# Patient Record
Sex: Male | Born: 1955
Health system: Southern US, Community
[De-identification: ages and names within clinical notes are randomized; demographics above are authoritative.]

## PROBLEM LIST (undated history)

## (undated) ENCOUNTER — Emergency Department (HOSPITAL_COMMUNITY): Admission: EM | Payer: Medicare Other

## (undated) DIAGNOSIS — Z9119 Patient's noncompliance with other medical treatment and regimen: Secondary | ICD-10-CM

## (undated) DIAGNOSIS — Z8673 Personal history of transient ischemic attack (TIA), and cerebral infarction without residual deficits: Secondary | ICD-10-CM

## (undated) DIAGNOSIS — E785 Hyperlipidemia, unspecified: Secondary | ICD-10-CM

## (undated) DIAGNOSIS — G4730 Sleep apnea, unspecified: Secondary | ICD-10-CM

## (undated) DIAGNOSIS — J811 Chronic pulmonary edema: Secondary | ICD-10-CM

## (undated) DIAGNOSIS — F329 Major depressive disorder, single episode, unspecified: Secondary | ICD-10-CM

## (undated) DIAGNOSIS — K219 Gastro-esophageal reflux disease without esophagitis: Secondary | ICD-10-CM

## (undated) DIAGNOSIS — I2510 Atherosclerotic heart disease of native coronary artery without angina pectoris: Secondary | ICD-10-CM

## (undated) DIAGNOSIS — J449 Chronic obstructive pulmonary disease, unspecified: Secondary | ICD-10-CM

## (undated) DIAGNOSIS — J9691 Respiratory failure, unspecified with hypoxia: Secondary | ICD-10-CM

## (undated) DIAGNOSIS — F419 Anxiety disorder, unspecified: Secondary | ICD-10-CM

## (undated) DIAGNOSIS — Z8701 Personal history of pneumonia (recurrent): Secondary | ICD-10-CM

## (undated) DIAGNOSIS — F1410 Cocaine abuse, uncomplicated: Secondary | ICD-10-CM

## (undated) DIAGNOSIS — M1990 Unspecified osteoarthritis, unspecified site: Secondary | ICD-10-CM

## (undated) DIAGNOSIS — R51 Headache: Secondary | ICD-10-CM

## (undated) DIAGNOSIS — J9600 Acute respiratory failure, unspecified whether with hypoxia or hypercapnia: Secondary | ICD-10-CM

## (undated) DIAGNOSIS — I10 Essential (primary) hypertension: Secondary | ICD-10-CM

## (undated) DIAGNOSIS — R519 Headache, unspecified: Secondary | ICD-10-CM

## (undated) DIAGNOSIS — K759 Inflammatory liver disease, unspecified: Secondary | ICD-10-CM

## (undated) DIAGNOSIS — G9341 Metabolic encephalopathy: Secondary | ICD-10-CM

## (undated) DIAGNOSIS — F32A Depression, unspecified: Secondary | ICD-10-CM

## (undated) DIAGNOSIS — E119 Type 2 diabetes mellitus without complications: Secondary | ICD-10-CM

## (undated) DIAGNOSIS — K810 Acute cholecystitis: Secondary | ICD-10-CM

## (undated) DIAGNOSIS — Z91199 Patient's noncompliance with other medical treatment and regimen due to unspecified reason: Secondary | ICD-10-CM

## (undated) DIAGNOSIS — C649 Malignant neoplasm of unspecified kidney, except renal pelvis: Secondary | ICD-10-CM

## (undated) DIAGNOSIS — Z87442 Personal history of urinary calculi: Secondary | ICD-10-CM

## (undated) HISTORY — DX: Malignant neoplasm of unspecified kidney, except renal pelvis: C64.9

## (undated) HISTORY — DX: Metabolic encephalopathy: G93.41

## (undated) HISTORY — DX: Essential (primary) hypertension: I10

## (undated) HISTORY — DX: Personal history of transient ischemic attack (TIA), and cerebral infarction without residual deficits: Z86.73

## (undated) HISTORY — DX: Atherosclerotic heart disease of native coronary artery without angina pectoris: I25.10

## (undated) HISTORY — DX: Patient's noncompliance with other medical treatment and regimen: Z91.19

## (undated) HISTORY — DX: Respiratory failure, unspecified with hypoxia: J96.91

## (undated) HISTORY — DX: Type 2 diabetes mellitus without complications: E11.9

## (undated) HISTORY — DX: Patient's noncompliance with other medical treatment and regimen due to unspecified reason: Z91.199

## (undated) HISTORY — DX: Chronic pulmonary edema: J81.1

## (undated) HISTORY — DX: Personal history of urinary calculi: Z87.442

## (undated) HISTORY — DX: Acute respiratory failure, unspecified whether with hypoxia or hypercapnia: J96.00

---
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
## 1998-01-29 ENCOUNTER — Emergency Department (HOSPITAL_COMMUNITY): Admission: EM | Admit: 1998-01-29 | Discharge: 1998-01-29 | Payer: Self-pay

## 1998-02-02 ENCOUNTER — Emergency Department (HOSPITAL_COMMUNITY): Admission: EM | Admit: 1998-02-02 | Discharge: 1998-02-02 | Payer: Self-pay | Admitting: Emergency Medicine

## 1998-08-05 ENCOUNTER — Inpatient Hospital Stay (HOSPITAL_COMMUNITY): Admission: EM | Admit: 1998-08-05 | Discharge: 1998-08-07 | Payer: Self-pay | Admitting: Emergency Medicine

## 1998-08-05 ENCOUNTER — Encounter: Payer: Self-pay | Admitting: Emergency Medicine

## 1998-08-07 ENCOUNTER — Encounter: Payer: Self-pay | Admitting: *Deleted

## 1998-08-12 ENCOUNTER — Encounter: Admission: RE | Admit: 1998-08-12 | Discharge: 1998-08-12 | Payer: Self-pay | Admitting: Internal Medicine

## 1998-08-15 ENCOUNTER — Encounter: Admission: RE | Admit: 1998-08-15 | Discharge: 1998-08-15 | Payer: Self-pay | Admitting: Internal Medicine

## 1998-08-15 ENCOUNTER — Ambulatory Visit (HOSPITAL_COMMUNITY): Admission: RE | Admit: 1998-08-15 | Discharge: 1998-08-15 | Payer: Self-pay | Admitting: Internal Medicine

## 2002-11-11 ENCOUNTER — Emergency Department (HOSPITAL_COMMUNITY): Admission: EM | Admit: 2002-11-11 | Discharge: 2002-11-11 | Payer: Self-pay | Admitting: Emergency Medicine

## 2004-11-09 ENCOUNTER — Emergency Department (HOSPITAL_COMMUNITY): Admission: EM | Admit: 2004-11-09 | Discharge: 2004-11-09 | Payer: Self-pay | Admitting: Emergency Medicine

## 2005-03-11 ENCOUNTER — Emergency Department (HOSPITAL_COMMUNITY): Admission: EM | Admit: 2005-03-11 | Discharge: 2005-03-11 | Payer: Self-pay | Admitting: Emergency Medicine

## 2005-05-25 ENCOUNTER — Emergency Department (HOSPITAL_COMMUNITY): Admission: EM | Admit: 2005-05-25 | Discharge: 2005-05-25 | Payer: Self-pay | Admitting: Emergency Medicine

## 2008-12-14 ENCOUNTER — Emergency Department (HOSPITAL_COMMUNITY): Admission: EM | Admit: 2008-12-14 | Discharge: 2008-12-14 | Payer: Self-pay | Admitting: Emergency Medicine

## 2008-12-25 ENCOUNTER — Emergency Department (HOSPITAL_COMMUNITY): Admission: EM | Admit: 2008-12-25 | Discharge: 2008-12-25 | Payer: Self-pay | Admitting: Emergency Medicine

## 2009-07-20 DIAGNOSIS — E119 Type 2 diabetes mellitus without complications: Secondary | ICD-10-CM

## 2009-07-20 HISTORY — DX: Type 2 diabetes mellitus without complications: E11.9

## 2010-10-27 LAB — BASIC METABOLIC PANEL
BUN: 8 mg/dL (ref 6–23)
CO2: 23 mEq/L (ref 19–32)
Calcium: 9.4 mg/dL (ref 8.4–10.5)
Chloride: 106 mEq/L (ref 96–112)

## 2012-05-31 ENCOUNTER — Observation Stay (HOSPITAL_BASED_OUTPATIENT_CLINIC_OR_DEPARTMENT_OTHER)
Admission: EM | Admit: 2012-05-31 | Discharge: 2012-05-31 | Discharge status: Against Medical Advice | Payer: Self-pay | Attending: Emergency Medicine | Admitting: Emergency Medicine

## 2012-05-31 ENCOUNTER — Emergency Department (HOSPITAL_BASED_OUTPATIENT_CLINIC_OR_DEPARTMENT_OTHER): Payer: Self-pay

## 2012-05-31 ENCOUNTER — Encounter (HOSPITAL_BASED_OUTPATIENT_CLINIC_OR_DEPARTMENT_OTHER): Payer: Self-pay | Admitting: Family Medicine

## 2012-05-31 DIAGNOSIS — R0602 Shortness of breath: Secondary | ICD-10-CM | POA: Insufficient documentation

## 2012-05-31 DIAGNOSIS — F17200 Nicotine dependence, unspecified, uncomplicated: Secondary | ICD-10-CM | POA: Insufficient documentation

## 2012-05-31 DIAGNOSIS — M7989 Other specified soft tissue disorders: Secondary | ICD-10-CM | POA: Insufficient documentation

## 2012-05-31 DIAGNOSIS — R079 Chest pain, unspecified: Secondary | ICD-10-CM

## 2012-05-31 DIAGNOSIS — Z8701 Personal history of pneumonia (recurrent): Secondary | ICD-10-CM | POA: Insufficient documentation

## 2012-05-31 DIAGNOSIS — I10 Essential (primary) hypertension: Secondary | ICD-10-CM | POA: Insufficient documentation

## 2012-05-31 DIAGNOSIS — Z8673 Personal history of transient ischemic attack (TIA), and cerebral infarction without residual deficits: Secondary | ICD-10-CM | POA: Insufficient documentation

## 2012-05-31 DIAGNOSIS — R072 Precordial pain: Principal | ICD-10-CM | POA: Insufficient documentation

## 2012-05-31 LAB — COMPREHENSIVE METABOLIC PANEL
ALT: 15 U/L (ref 0–53)
CO2: 22 mEq/L (ref 19–32)
Chloride: 103 mEq/L (ref 96–112)
Creatinine, Ser: 1 mg/dL (ref 0.50–1.35)
GFR calc Af Amer: >90 mL/min (ref >90)
GFR calc non Af Amer: 82 mL/min — ABNORMAL LOW (ref >90)
Potassium: 3.5 mEq/L (ref 3.5–5.1)
Sodium: 137 mEq/L (ref 135–145)

## 2012-05-31 LAB — CBC
MCV: 83.1 fL (ref 78.0–100.0)
RDW: 13.8 % (ref 11.5–15.5)
WBC: 10.5 10*3/uL (ref 4.0–10.5)

## 2012-05-31 LAB — CK TOTAL AND CKMB (NOT AT ARMC)
CK, MB: 4.1 ng/mL — ABNORMAL HIGH (ref 0.3–4.0)
Relative Index: 3.7 — ABNORMAL HIGH (ref 0.0–2.5)
Total CK: 112 U/L (ref 7–232)

## 2012-05-31 LAB — TROPONIN I: Troponin I: <0.3 ng/mL (ref <0.30)

## 2012-05-31 LAB — PRO B NATRIURETIC PEPTIDE: Pro B Natriuretic peptide (BNP): 128.8 pg/mL — ABNORMAL HIGH (ref 0–125)

## 2012-05-31 MED ORDER — ONDANSETRON HCL 4 MG/2ML IJ SOLN
4.0000 mg | Freq: Three times a day (TID) | INTRAMUSCULAR | Status: DC | PRN
Start: 2012-05-31 — End: 2012-05-31

## 2012-05-31 MED ORDER — NITROGLYCERIN 0.4 MG SL SUBL
0.4000 mg | SUBLINGUAL_TABLET | SUBLINGUAL | Status: DC | PRN
  Administered 2012-05-31 (×2): 0.4 mg via SUBLINGUAL
  Filled 2012-05-31: qty 25

## 2012-05-31 MED ORDER — ASPIRIN EC 325 MG PO TBEC
325.0000 mg | DELAYED_RELEASE_TABLET | Freq: Once | ORAL | Status: AC
  Administered 2012-05-31: 325 mg via ORAL
  Filled 2012-05-31: qty 1

## 2012-05-31 MED ORDER — NITROGLYCERIN 0.4 MG SL SUBL: 0.4000 mg | SUBLINGUAL_TABLET | SUBLINGUAL | Status: DC | PRN

## 2012-05-31 NOTE — ED Notes (Signed)
Pt c/o left sided chest pain radiating down left arm x 2 wks but worse since last night. Pt reports similar pain in past and sts he had pneumonia at that time. Pt also c/o dyspnea with exertion. Pt is drug user and sts last use last week.

## 2012-05-31 NOTE — ED Notes (Signed)
Pt sts he cannot provide urine specimen.

## 2012-05-31 NOTE — ED Provider Notes (Signed)
I saw and evaluated the patient, reviewed the resident's note and I agree with the findings and plan.   .Face to face Exam:  General:  Awake HEENT:  Atraumatic Resp:  Normal effort Abd:  Nondistended Neuro:No focal weakness Lymph: No adenopathy  Patient refuses to be admitted to the hospital.  Patient signed out AGAINST MEDICAL ADVICE.  Patient was instructed of the risks for signing out AGAINST MEDICAL ADVICE which included sudden death or heart attack.  Patient was given prescription for nitroglycerin and told to start daily aspirin.  If he changes his mind or has worsening condition he can return here or to the nearest hospital.  Nelia Shi, MD 05/31/12 1147

## 2012-05-31 NOTE — Plan of Care (Signed)
Called by [facility]/ Dr Radford Pax for [name] at [facility] ED. briefly 56 year old male with history of hypertension, history of prior CVA, polysubstance abuse (cocaine) presented with chest pain, radiating to the left arm, pressure-like improved with nitroglycerin s/l, troponin negative however CK-MB and index elevated. EKG normal with no acute ST-T wave changes. Patient accepted to telemetry further workup, chest pain r/o.   RAI,RIPUDEEP M.D. [facility] 05/31/2012, 11:35 AM  Pager: [phone]

## 2012-05-31 NOTE — ED Provider Notes (Signed)
History     CSN: [id number]  Arrival date & time 05/31/12  0915   First MD Initiated Contact with Patient 05/31/12 [phone]      Chief Complaint  Patient presents with  . Chest Pain    (Consider location/radiation/quality/duration/timing/severity/associated sxs/prior treatment) HPI Pt complains of chest pain and SOB which has been occurring intermittently at rest for 2 weeks. He states the most recent episode occurred while en route to ED for evaluation, and has since resolved. Pt states CP/SOB occur as frequently as every hour. He states the pain is substernal with radiation to the left-chest and sometimes the left arm. He describes the pain as pressure-like. He states that with these episodes he also has feelings of warmth and diarphoresis. He states he took 3 of his mother's NTG yesterday evening which resolved an episode of pain within 5 minutes. He states that since the summer he had been having CP/SOB on exertion relieved with rest, but that over the last 2 weeks these episodes have also occurred at rest, as previously described. He denies any other alleviating or exacerbating factors. Pt states he was incarcerated from 2007-2010 and after paroled he has not taken any of his previous anti-hypertensives. He states that he smokes 1ppd and uses cocaine frequently, with previous use as recent as 1 week prior. He denies any temporal relationship between cocaine use and CP onset. Pt states his brother had an MI and subsequently had stenting at age 64.  Past Medical History  Diagnosis Date  . Hypertension   . Stroke   . Pneumonia     Past Surgical History  Procedure Date  . Appendectomy     No family history on file.  History  Substance Use Topics  . Smoking status: Current Every Day Smoker    Types: Cigarettes  . Smokeless tobacco: Not on file  . Alcohol Use: No      Review of Systems  Constitutional: Negative for fever and chills.  HENT: Negative for congestion.     Respiratory: Positive for shortness of breath. Negative for cough and wheezing.   Cardiovascular: Positive for chest pain and leg swelling. Negative for palpitations.  Gastrointestinal: Negative for nausea, vomiting and diarrhea.  Genitourinary: Negative.   Musculoskeletal: Negative.   Skin: Negative.   Neurological: Negative for dizziness, syncope and light-headedness.  Hematological: Negative.   Psychiatric/Behavioral: Negative.     Allergies  Review of patient's allergies indicates no known allergies.  Home Medications  No current outpatient prescriptions on file.  BP 120/65  Pulse 80  Temp 97.5 F (36.4 C) (Oral)  Resp 23  SpO2 92%  Physical Exam  Constitutional: He is oriented to person, place, and time. He appears well-developed and well-nourished. No distress.  HENT:  Head: Normocephalic and atraumatic.  Eyes: Pupils are equal, round, and reactive to light. No scleral icterus.  Neck: Normal range of motion. No tracheal deviation present.  Cardiovascular: Normal rate and regular rhythm.   No murmur heard. Pulmonary/Chest: Effort normal. He has no wheezes. He has no rales.  Abdominal: Soft. Bowel sounds are normal. He exhibits no distension. There is no tenderness.  Musculoskeletal: Normal range of motion. He exhibits no edema.  Neurological: He is alert and oriented to person, place, and time. No cranial nerve deficit.  Skin: Skin is warm and dry. No rash noted.  Psychiatric: He has a normal mood and affect. His behavior is normal.    ED Course  Procedures (including critical care time)   Date:  05/31/2012  Rate: 74  Rhythm: normal sinus rhythm  QRS Axis: normal  Intervals: normal  ST/T Wave abnormalities: nonspecific ST changes  Conduction Disutrbances:none  Narrative Interpretation:   Old EKG Reviewed: none available   Labs Reviewed  COMPREHENSIVE METABOLIC PANEL - Abnormal; Notable for the following:    Glucose, Bld 187 (*)     Albumin 3.4 (*)      GFR calc non Af Amer 82 (*)     All other components within normal limits  CK TOTAL AND CKMB - Abnormal; Notable for the following:    CK, MB 4.1 (*)     Relative Index 3.7 (*)     All other components within normal limits  PRO B NATRIURETIC PEPTIDE - Abnormal; Notable for the following:    Pro B Natriuretic peptide (BNP) 128.8 (*)     All other components within normal limits  CBC  TROPONIN I  URINALYSIS, ROUTINE W REFLEX MICROSCOPIC  URINE RAPID DRUG SCREEN (HOSP PERFORMED)   Dg Chest 2 View  05/31/2012  *RADIOLOGY REPORT*  Clinical Data: Left sided chest pain and shortness of breath for 2 weeks  CHEST - 2 VIEW  Comparison: 03/11/2005  Findings: Heart size and vascular pattern are normal.  Lungs are clear.  Osseous thorax is intact.  IMPRESSION:  Negative   Original Report Authenticated By: Esperanza Heir, M.D.      1. Chest pain       MDM  Patient stable. Troponin wnl. CKMB mildly elevated, with elevated RI. No CP at present. Pt to be transferred to telemetry bed at [facility] for further evaluation/management for suspected unstable angina. Dr. Isidoro Donning will be the accepting physician.         Elfredia Nevins, MD 05/31/12 1135

## 2012-06-01 ENCOUNTER — Inpatient Hospital Stay (HOSPITAL_COMMUNITY)
Admission: EM | Admit: 2012-06-01 | Discharge: 2012-06-03 | DRG: 287 | Discharge status: Against Medical Advice | Payer: MEDICAID | Attending: Internal Medicine | Admitting: Internal Medicine

## 2012-06-01 ENCOUNTER — Encounter (HOSPITAL_COMMUNITY): Payer: Self-pay | Admitting: *Deleted

## 2012-06-01 ENCOUNTER — Emergency Department (HOSPITAL_COMMUNITY): Payer: Self-pay

## 2012-06-01 DIAGNOSIS — Z91199 Patient's noncompliance with other medical treatment and regimen due to unspecified reason: Secondary | ICD-10-CM

## 2012-06-01 DIAGNOSIS — E119 Type 2 diabetes mellitus without complications: Secondary | ICD-10-CM | POA: Diagnosis present

## 2012-06-01 DIAGNOSIS — E785 Hyperlipidemia, unspecified: Secondary | ICD-10-CM | POA: Diagnosis present

## 2012-06-01 DIAGNOSIS — F1410 Cocaine abuse, uncomplicated: Secondary | ICD-10-CM

## 2012-06-01 DIAGNOSIS — Z7982 Long term (current) use of aspirin: Secondary | ICD-10-CM

## 2012-06-01 DIAGNOSIS — Z79899 Other long term (current) drug therapy: Secondary | ICD-10-CM

## 2012-06-01 DIAGNOSIS — Z8673 Personal history of transient ischemic attack (TIA), and cerebral infarction without residual deficits: Secondary | ICD-10-CM

## 2012-06-01 DIAGNOSIS — I200 Unstable angina: Secondary | ICD-10-CM

## 2012-06-01 DIAGNOSIS — Z9119 Patient's noncompliance with other medical treatment and regimen: Secondary | ICD-10-CM

## 2012-06-01 DIAGNOSIS — Z9114 Patient's other noncompliance with medication regimen: Secondary | ICD-10-CM

## 2012-06-01 DIAGNOSIS — Z91148 Patient's other noncompliance with medication regimen for other reason: Secondary | ICD-10-CM

## 2012-06-01 DIAGNOSIS — Z23 Encounter for immunization: Secondary | ICD-10-CM

## 2012-06-01 DIAGNOSIS — I10 Essential (primary) hypertension: Secondary | ICD-10-CM | POA: Diagnosis present

## 2012-06-01 DIAGNOSIS — F17200 Nicotine dependence, unspecified, uncomplicated: Secondary | ICD-10-CM | POA: Diagnosis present

## 2012-06-01 DIAGNOSIS — I2510 Atherosclerotic heart disease of native coronary artery without angina pectoris: Principal | ICD-10-CM | POA: Diagnosis present

## 2012-06-01 DIAGNOSIS — IMO0002 Reserved for concepts with insufficient information to code with codable children: Secondary | ICD-10-CM

## 2012-06-01 DIAGNOSIS — Z720 Tobacco use: Secondary | ICD-10-CM

## 2012-06-01 DIAGNOSIS — E1165 Type 2 diabetes mellitus with hyperglycemia: Secondary | ICD-10-CM

## 2012-06-01 DIAGNOSIS — Z8249 Family history of ischemic heart disease and other diseases of the circulatory system: Secondary | ICD-10-CM

## 2012-06-01 DIAGNOSIS — Z9089 Acquired absence of other organs: Secondary | ICD-10-CM

## 2012-06-01 DIAGNOSIS — R079 Chest pain, unspecified: Secondary | ICD-10-CM

## 2012-06-01 HISTORY — DX: Inflammatory liver disease, unspecified: K75.9

## 2012-06-01 LAB — CBC WITH DIFFERENTIAL/PLATELET
Basophils Absolute: 0 10*3/uL (ref 0.0–0.1)
Basophils Relative: 0 % (ref 0–1)
Eosinophils Absolute: 0.2 10*3/uL (ref 0.0–0.7)
Eosinophils Relative: 2 % (ref 0–5)
MCH: 28.4 pg (ref 26.0–34.0)
MCV: 84.8 fL (ref 78.0–100.0)
Neutrophils Relative %: 74 % (ref 43–77)
Platelets: 202 10*3/uL (ref 150–400)
RBC: 5.45 MIL/uL (ref 4.22–5.81)
RDW: 13.6 % (ref 11.5–15.5)

## 2012-06-01 LAB — RAPID URINE DRUG SCREEN, HOSP PERFORMED
Amphetamines: NOT DETECTED
Barbiturates: NOT DETECTED
Benzodiazepines: NOT DETECTED
Cocaine: POSITIVE — AB
Tetrahydrocannabinol: NOT DETECTED

## 2012-06-01 LAB — LIPID PANEL
HDL: 24 mg/dL — ABNORMAL LOW (ref >39)
LDL Cholesterol: 99 mg/dL (ref 0–99)
Triglycerides: 307 mg/dL — ABNORMAL HIGH (ref <150)
VLDL: 61 mg/dL — ABNORMAL HIGH (ref 0–40)

## 2012-06-01 LAB — HEPARIN LEVEL (UNFRACTIONATED): Heparin Unfractionated: <0.1 IU/mL — ABNORMAL LOW (ref 0.30–0.70)

## 2012-06-01 LAB — POCT I-STAT TROPONIN I: Troponin i, poc: 0 ng/mL (ref 0.00–0.08)

## 2012-06-01 LAB — BASIC METABOLIC PANEL
Calcium: 9 mg/dL (ref 8.4–10.5)
GFR calc Af Amer: >90 mL/min (ref >90)
GFR calc non Af Amer: 80 mL/min — ABNORMAL LOW (ref >90)
Glucose, Bld: 206 mg/dL — ABNORMAL HIGH (ref 70–99)
Potassium: 3.3 mEq/L — ABNORMAL LOW (ref 3.5–5.1)
Sodium: 136 mEq/L (ref 135–145)

## 2012-06-01 LAB — TROPONIN I
Troponin I: <0.3 ng/mL (ref <0.30)
Troponin I: <0.3 ng/mL (ref <0.30)
Troponin I: <0.3 ng/mL (ref <0.30)

## 2012-06-01 LAB — PROTIME-INR: Prothrombin Time: 13.9 seconds (ref 11.6–15.2)

## 2012-06-01 MED ORDER — DIAZEPAM 5 MG PO TABS: 5.0000 mg | ORAL_TABLET | ORAL | Status: DC

## 2012-06-01 MED ORDER — SODIUM CHLORIDE 0.9 % IJ SOLN: 3.0000 mL | INTRAMUSCULAR | Status: DC | PRN

## 2012-06-01 MED ORDER — POTASSIUM CHLORIDE CRYS ER 20 MEQ PO TBCR
40.0000 meq | EXTENDED_RELEASE_TABLET | Freq: Once | ORAL | Status: AC
  Administered 2012-06-01: 40 meq via ORAL
  Filled 2012-06-01: qty 2

## 2012-06-01 MED ORDER — SODIUM CHLORIDE 0.9 % IV SOLN: 250.0000 mL | INTRAVENOUS | Status: DC | PRN

## 2012-06-01 MED ORDER — SODIUM CHLORIDE 0.9 % IV SOLN
INTRAVENOUS | Status: DC
  Administered 2012-06-01 – 2012-06-02 (×2): via INTRAVENOUS

## 2012-06-01 MED ORDER — HEPARIN (PORCINE) IN NACL 100-0.45 UNIT/ML-% IJ SOLN
1800.0000 [IU]/h | INTRAMUSCULAR | Status: DC
  Administered 2012-06-01: 1800 [IU]/h via INTRAVENOUS
  Administered 2012-06-01: 1400 [IU]/h via INTRAVENOUS
  Administered 2012-06-02: 1800 [IU]/h via INTRAVENOUS
  Filled 2012-06-01 (×4): qty 250

## 2012-06-01 MED ORDER — MORPHINE SULFATE 4 MG/ML IJ SOLN
4.0000 mg | Freq: Once | INTRAMUSCULAR | Status: AC
  Administered 2012-06-01: 4 mg via INTRAVENOUS
  Filled 2012-06-01: qty 1

## 2012-06-01 MED ORDER — HEPARIN SODIUM (PORCINE) 5000 UNIT/ML IJ SOLN
5000.0000 [IU] | Freq: Three times a day (TID) | INTRAMUSCULAR | Status: DC
  Filled 2012-06-01 (×3): qty 1

## 2012-06-01 MED ORDER — SODIUM CHLORIDE 0.9 % IJ SOLN
3.0000 mL | Freq: Two times a day (BID) | INTRAMUSCULAR | Status: DC
  Administered 2012-06-02: 3 mL via INTRAVENOUS

## 2012-06-01 MED ORDER — ACETAMINOPHEN 325 MG PO TABS: 650.0000 mg | ORAL_TABLET | Freq: Four times a day (QID) | ORAL | Status: DC | PRN

## 2012-06-01 MED ORDER — SODIUM CHLORIDE 0.9 % IJ SOLN: 3.0000 mL | Freq: Two times a day (BID) | INTRAMUSCULAR | Status: DC

## 2012-06-01 MED ORDER — ASPIRIN EC 81 MG PO TBEC
81.0000 mg | DELAYED_RELEASE_TABLET | Freq: Every day | ORAL | Status: DC
  Administered 2012-06-01: 81 mg via ORAL
  Filled 2012-06-01: qty 1

## 2012-06-01 MED ORDER — NITROGLYCERIN 0.4 MG SL SUBL: 0.4000 mg | SUBLINGUAL_TABLET | SUBLINGUAL | Status: DC | PRN

## 2012-06-01 MED ORDER — ACETAMINOPHEN 650 MG RE SUPP: 650.0000 mg | Freq: Four times a day (QID) | RECTAL | Status: DC | PRN

## 2012-06-01 MED ORDER — ASPIRIN EC 81 MG PO TBEC
81.0000 mg | DELAYED_RELEASE_TABLET | Freq: Every day | ORAL | Status: DC
  Filled 2012-06-01: qty 1

## 2012-06-01 MED ORDER — MORPHINE SULFATE 2 MG/ML IJ SOLN
1.0000 mg | INTRAMUSCULAR | Status: DC | PRN
  Administered 2012-06-02: 1 mg via INTRAVENOUS

## 2012-06-01 MED ORDER — INSULIN ASPART 100 UNIT/ML ~~LOC~~ SOLN: 0.0000 [IU] | SUBCUTANEOUS | Status: DC

## 2012-06-01 MED ORDER — HEPARIN BOLUS VIA INFUSION
4000.0000 [IU] | Freq: Once | INTRAVENOUS | Status: AC
  Administered 2012-06-01: 4000 [IU] via INTRAVENOUS

## 2012-06-01 MED ORDER — ASPIRIN 81 MG PO CHEW
324.0000 mg | CHEWABLE_TABLET | ORAL | Status: AC
  Administered 2012-06-02: 324 mg via ORAL
  Filled 2012-06-01: qty 4

## 2012-06-01 MED ORDER — NITROGLYCERIN IN D5W 200-5 MCG/ML-% IV SOLN
5.0000 ug/min | INTRAVENOUS | Status: DC
  Administered 2012-06-01: 5 ug/min via INTRAVENOUS
  Filled 2012-06-01: qty 250

## 2012-06-01 MED ORDER — ATORVASTATIN CALCIUM 20 MG PO TABS
20.0000 mg | ORAL_TABLET | Freq: Every day | ORAL | Status: DC
  Administered 2012-06-01 – 2012-06-02 (×2): 20 mg via ORAL
  Filled 2012-06-01 (×4): qty 1

## 2012-06-01 MED ORDER — METOPROLOL TARTRATE 12.5 MG HALF TABLET
12.5000 mg | ORAL_TABLET | Freq: Two times a day (BID) | ORAL | Status: DC
  Filled 2012-06-01 (×2): qty 1

## 2012-06-01 MED ORDER — HEPARIN BOLUS VIA INFUSION
2000.0000 [IU] | Freq: Once | INTRAVENOUS | Status: AC
  Administered 2012-06-01: 2000 [IU] via INTRAVENOUS
  Filled 2012-06-01: qty 2000

## 2012-06-01 NOTE — H&P (Signed)
[facility] Admission Note Date: 06/01/2012  Patient name: [name] Medical record number: [id number] Date of birth: [birth year] Age: 56 y.o. Gender: male PCP: No primary provider on file.  Medical Service: Internal medicine teaching service  Attending physician: Debe Coder, M.D.    First Contact: Dr. Garald Braver Pager: [phone] Second Contact: Dr. Clyde Lundborg Pager: [phone] After Hours (after 5PM)/ Weekend / Holidays: First Contact: Pager: [phone] Second Contact: Pager: [phone]  Chief Complaint: CP and SOB  History of Present Illness:   [name] is a 56 yo man with history of HTN, DM, and TIA presented to the ED with 3-week history of substernal intermittent chest pain.  He first noticed the pain during physical activities such as walking to his car or up and down the stairs about 3 weeks ago.  Each episode would only last for about 5 min and resolve on its own with rest.  He had on average 7~8 times daily.  During these CP episodes, the pain sometime radiates to his left chest, shoulder, and arm. He also becomes SOB, and dizzy.  Lately, his pain occurs even at rest, mostly pressure in nature; he sometimes wake up in the middle of night with this chest pressure.  Yesterday when the pain became worse, he went to the ED at [facility].  He was given nitroglycerin which was moderately helpful.  However, he left due to some "business" that he had to take care of before he could be admitted.  He was also started on ASA yesterday.  Since the pain recurred as before, he decided to come back to the hospital again today.  Denied vision changes, fever, weight loss, night sweat, recent sickness, HA, cough, or dysuria.  In ED, EKG showed new T-wave inversion in lead II and aVF.  Troponin has been negative x2.  Patient denied ever experienced chest pain prior to 3 weeks ago.  No history of CAD, echo, angiogram, or cardiac cath. He was incarcerated from 2007 to 2010 and he has not taken  his BP medication after parole.  He endorses mild swelling in bilateral hands and feet, worse later in the day. He was diagnosed with DM two years ago but stopped taking metformin after 4 months due to diarrhea.  He has not been check his blood sugar at home.  He smokes one pack of cigarettes daily and stated that a physician has hold him that he has hyperlipidemia.  Per ED note, patient uses cocaine frequently but denied its association with CP.  Patient has a brother who had an MI s/p stenting at age 63.            Meds: Current Outpatient Rx  Name  Route  Sig  Dispense  Refill  . ASPIRIN 325 MG PO TABS   Oral   Take 325 mg by mouth daily.         [name] NITROGLYCERIN 0.4 MG SL SUBL   Sublingual   Place 1 tablet (0.4 mg total) under the tongue every 5 (five) minutes as needed for chest pain.   30 tablet   0     Allergies: Allergies as of 06/01/2012  . (No Known Allergies)   Past Medical History  Diagnosis Date  . Hypertension     Was on metoprolol, patient decided to stop taking it about two years ago  . TIA (transient ischemic attack) 1997    No residual neurological deficits.  . Pneumonia   . DM (diabetes mellitus) 2011  Was on metformin, but patient stopped taking it after 4 mo due to diarrhea  . Hepatitis late 1970s    Treated in the hospital, finished treatment course. No recurrence  since   Past Surgical History  Procedure Date  . Appendectomy    Family History  Problem Relation Age of Onset  . Hypertension    . Diabetes    . Stroke    . Cancer Father     Lung  . Cancer Mother     Thyroid  . Cancer Maternal Grandmother     Breast  . Cancer Maternal Grandfather     Throat and stomach   History   Social History  . Marital Status: Single    Spouse Name: N/A    Number of Children: N/A  . Years of Education: N/A   Occupational History  . Not on file.   Social History Main Topics  . Smoking status: Current Every Day Smoker    Types: Cigarettes  .  Smokeless tobacco: Not on file     Comment: Patient does not think it is a problem for him at this time.  . Alcohol Use: No  . Drug Use: Yes    Special: Cocaine  . Sexually Active: Not on file   Other Topics Concern  . Not on file   Social History Narrative  . No narrative on file    Review of Systems: Constitutional: negative for chills, fevers, night sweats and weight loss Eyes: negative for visual disturbance Ears, nose, mouth, throat, and face: negative for hearing loss, hoarseness and tinnitus Respiratory: negative for cough, pneumonia and sputum Cardiovascular: negative for palpitations and syncope Gastrointestinal: negative for abdominal pain, constipation and diarrhea Genitourinary:negative for dysuria Musculoskeletal:positive for stiff joints and in bilateral hands and feet Neurological: negative for dizziness, headaches and speech problems  Physical Exam: Blood pressure 136/89, pulse 83, temperature 97.8 F (36.6 C), temperature source Oral, resp. rate 14, height 6' (1.829 m), weight 106.595 kg (235 lb), SpO2 97.00%. BP 136/89  Pulse 83  Temp 97.8 F (36.6 C) (Oral)  Resp 14  Ht 6' (1.829 m)  Wt 106.595 kg (235 lb)  BMI 31.87 kg/m2  SpO2 97%  General Appearance:    Alert, cooperative, not in acute distress, appears stated age  Head:    Normocephalic, without obvious abnormality, atraumatic  Eyes:    PERRL, conjunctiva/corneas clear, EOM's intact, both eyes            Nose:   Nares normal, septum midline, mucosa normal, no drainage    or sinus tenderness  Throat:   Lips, mucosa, and tongue normal; teeth and gums normal  Neck:   Supple, symmetrical, trachea midline, no adenopathy;       thyroid:  No enlargement/tenderness/nodules     Lungs:     Clear to auscultation bilaterally, respirations unlabored  Chest wall:    No tenderness or deformity  Heart:    Regular rate and rhythm, S1 and S2 normal, no murmur, rub   or gallop  Abdomen:     Soft, non-tender,  bowel sounds active all four quadrants,    no masses, no organomegaly appreciated given body habitus        Extremities:   Extremities normal, atraumatic, no cyanosis or edema  Pulses:   2+ and symmetric all extremities  Skin:   Skin color, texture, turgor normal, no rashes or lesions     Neurologic:   CNII-XII intact. Normal strength, sensation and reflexes  throughout    Lab results: Basic Metabolic Panel:  Basename 06/01/12 1157 05/31/12 0940  NA 136 137  K 3.3* 3.5  CL 100 103  CO2 24 22  GLUCOSE 206* 187*  BUN 10 15  CREATININE 1.02 1.00  CALCIUM 9.0 9.0  MG -- --  PHOS -- --   Liver Function Tests:  [facility] 05/31/12 0940  AST 13  ALT 15  ALKPHOS 93  BILITOT 0.3  PROT 7.2  ALBUMIN 3.4*   CBC:  Basename 06/01/12 1157 05/31/12 0940  WBC 11.0* 10.5  NEUTROABS 8.2* --  HGB 15.5 14.8  HCT 46.2 44.2  MCV 84.8 83.1  PLT 202 197   Cardiac Enzymes:  Basename 06/01/12 1225 05/31/12 0940  CKTOTAL -- 112  CKMB -- 4.1*  CKMBINDEX -- --  TROPONINI <0.30 <0.30   BNP:  Basename 05/31/12 0940  PROBNP 128.8*    Imaging results:  Dg Chest 2 View  06/01/2012  *RADIOLOGY REPORT*  Clinical Data: Chest pain  CHEST - 2 VIEW  Comparison:  May 31, 2012  Findings: The lungs clear.  The heart size and pulmonary vascularity are normal.  No adenopathy.  There is thoracic levoscoliosis.  IMPRESSION: No edema or consolidation.   Original Report Authenticated By: Bretta Bang, M.D.    Dg Chest 2 View  05/31/2012  *RADIOLOGY REPORT*  Clinical Data: Left sided chest pain and shortness of breath for 2 weeks  CHEST - 2 VIEW  Comparison: 03/11/2005  Findings: Heart size and vascular pattern are normal.  Lungs are clear.  Osseous thorax is intact.  IMPRESSION:  Negative   Original Report Authenticated By: Esperanza Heir, M.D.     Other results: EKG: Normal sinus rhythm, rightward axis, new T-wave inversion in lead III and aVF.    Assessment & Plan by  Problem: Active Problems:  Chest pain  Mr. Figueira is a 56 yo man with history of HTN, DM, and TIA presented to the ED with 3-week history of substernal intermittent chest pain.   1. Chest pain: patient seems to have typical symptoms of unstable angina such as intermittent substernal CP and SOB with exertion, recently worsened to acute onset even at rest.  Pain is pressure in nature, often radiates to left chest and down left arm.  It resolves with rest and nitroglycerin. Patient has multiple risk factors such as reported HTN, DM, hyperlipidemia, smoking, and cocaine use.  Other less likely causes of CP on DDx include but not limited to MI, pericarditis, aortic dissection, GERD, MSK issues.  Troponin has been negative x2. Patient's pain were not reproducible with chest expansion or palpation. Onset seemed random rather than associated with food intake.  Patient has been hemodynamically stable, CP is not tearing in nature or radiating to the back.  -will admit to floor with tele -continue cycle CE -continue monitor patient's symptoms and consider cards consult for further workup -morphine 2mg  q3h and acetaminophen 650mg  q6hr prn for pain -nitroglycerin SL -continue ASA -start heparin drip -replete K -Will f/u 2D echo -f/u CBC, CMP, coag, A1C, lipid penal, TSH -start Lipitor for cardiac protection  2. HTN: reported hx of HTN, used to be on metoprolol. He stopped taking it about two years ago. BP has been ranging from 136~170/81~92. -start Metoprolol 12.5mg  BID    3. DM: per patient, he was diagnosed in 2011. Taken metformin for 4 months and stopped due to diarrhea. Then lost to follow up.  No A1C on file.  -f/u A1C   -  start SSI     4. Diet: heart healthy  5. PPx: on heparin drip    This is a Psychologist, occupational Note.  The care of the patient was discussed with Dr.Niu and the assessment and plan was formulated with their assistance.  Please see their note for official documentation of the  patient encounter.   SignedMaren Beach 06/01/2012, 4:07 PM   Internal Medicine Teaching Service Resident Admission Note Date: 06/02/2012  Patient name: [name] Medical record number: [id number] Date of birth: [birth year] Age: 56 y.o. Gender: male PCP: No primary provider on file.  Medical Service:  I have reviewed the note by Maren Beach MS IV and was present during the interview and physical exam.  Please see below for findings, assessment, and plan.  Chief Complaint: "I have chest pain"  History of Present Illness: Mr. Schwegler is a 55 year old man with PMH significant for HTN, DM2, and TIA who comes in to the [facility] with history of three weeks of intermittent substernal chest pain. The pain started with exertion but now is present at rest, even waking him up at night. The chest pain episodes last for 5 minutes and resolved with rest. He has 7-8 episodes per day. The pain is described as chest pressure and pain in his left chest that radiates to his shoulder and arm, with occasional shortness of breath and dizziness. The day prior to his presentation he went to the ED at [facility] for increased chest pressure. He was given aspirin and nitroglycerin with resolution of his chest pain. He was also evaluated for admission but he left before being admitted.  He denies ever experienced chest pain prior to 3 weeks ago.  He has no history of CAD, echo, angiogram, or cardiac cath. He was incarcerated from 2007 to 2010 and he has not taken his BP medication after parole.  He endorses mild swelling in bilateral hands and feet, worse later in the day for a while now. He was diagnosed with DM two years ago but stopped taking metformin after 4 months due to diarrhea.  He has not been check his blood sugar at home.  He smokes one pack of cigarettes daily and stated that a physician has hold him that he has hyperlipidemia.  Per ED note, patient uses cocaine frequently but denied its association with CP.  Patient  has a brother who had an MI s/p stenting at age 23.  In the ED his EKG showed new T-wave inversion in lead II and aVF.  Troponin has been negative x2. He will be admitted for further evaluation and treatment to include evaluation by Cardiology.     Meds: Medications Prior to Admission  Medication Sig Dispense Refill  . aspirin 325 MG tablet Take 325 mg by mouth daily.      . nitroGLYCERIN (NITROSTAT) 0.4 MG SL tablet Place 1 tablet (0.4 mg total) under the tongue every 5 (five) minutes as needed for chest pain.  30 tablet  0    Allergies: Allergies as of 06/01/2012  . (No Known Allergies)    Past Medical History: Medical Student note reviewed  Family History: Medical Student note reviewed  Social History: Medical Student note reviewed  Surgical History: Medical Student note reviewed  Review of System: Medical Student note reviewed  Physical Exam: Blood pressure 147/74, pulse 73, temperature 97.9 F (36.6 C), temperature source Oral, resp. rate 20, height 6' (1.829 m), weight 225 lb 5 oz (102.2 kg), SpO2 92.00%.  General Appearance:    Alert, cooperative, not in acute distress, appears stated age  Head:    Normocephalic, without obvious abnormality, atraumatic  Eyes:    PERRL, conjunctiva/corneas clear, EOM's intact, both eyes            Nose:   Nares normal, septum midline, mucosa normal, no drainage    or sinus tenderness  Throat:   Lips, mucosa, and tongue normal; teeth and gums normal  Neck:   Supple, symmetrical, trachea midline, no adenopathy;       thyroid:  No enlargement/tenderness/nodules     Lungs:     Clear to auscultation bilaterally, respirations unlabored  Chest wall:    No tenderness or deformity  Heart:    Regular rate and rhythm, S1 and S2 normal, no murmur, rub   or gallop  Abdomen:     Soft, non-tender, bowel sounds active all four quadrants,    no masses, no organomegaly appreciated given body habitus        Extremities:   Extremities normal,  atraumatic, no cyanosis or edema  Pulses:   2+ and symmetric all extremities  Skin:   Skin color, texture, turgor normal, no rashes or lesions     Neurologic:   CNII-XII intact. Normal strength, sensation and reflexes      throughout   Labs: Reviewed as noted in the Electronic Record  Imaging: Reviewed as noted in the Electronic Record  Assessment & Plan by Problem: Mr. Raz is a 56 yo man with history of HTN, DM, and TIA presented to the ED with 3-week history of substernal intermittent chest pain.   1. Chest pain: Likely unstable angina such as intermittent substernal CP and SOB with exertion, recently worsened to acute onset even at rest.  Patient has multiple risk factors such as reported HTN, DM, hyperlipidemia, smoking, and cocaine use. Other less likely causes of CP on DDx include but not limited to MI, pericarditis, aortic dissection, GERD, MSK issues.  Troponin has been negative x2 making ACS, with no ST elevation making less likely. Patient's pain were not reproducible with chest expansion or palpation making pleuritic chest pain less likely. Onset seemed random rather than associated with food intake making GERD less likely.  Patient has been hemodynamically stable, CP is not tearing in nature or radiating to the back, making PE less likely. Cocaine vasospasm also a possibility but patient denies using cocaine 3 weeks ago when the chest pain first started.  -will admit to floor with tele -continue cycle CE -Cardiology consulted, appreciate help managing this patient -morphine 2mg  q3h and acetaminophen 650mg  q6hr prn for pain -nitroglycerin SL -continue ASA -start heparin drip -replete K -Will f/u 2D echo -f/u CBC, CMP, coag, A1C, lipid penal, TSH -start Lipitor for cardiac protection  2. HTN: reported hx of HTN, used to be on metoprolol. He stopped taking it about two years ago. BP has been ranging from 136~170/81~92. Patient takes metoprolol at home. Given history of  cocaine, will switch metoprolol to labetalol      3. DM: per patient, he was diagnosed in 2011. Taken metformin for 4 months and stopped due to diarrhea. Then lost to follow up.  No A1C on file.  -f/u A1C   -start SSI     4. Diet: heart healthy  5. PPx: on heparin drip    Signed:  Ky Barban  06/02/2012, 7:04AM

## 2012-06-01 NOTE — Progress Notes (Signed)
Pt c/o 9/10 chest pressure stating that "it feels like a ton of people are sitting on my chest and I cant breath." VSS and documented in doc flowsheets. EKG obtained with no acute changes noted from previous EKG. Nitro titrated to 65mcg/hr with complete relief of chest pain/pressure. Pt refuses to wear O2 at this time. MD on call paged and made aware. Will continue to monitor patient closely.

## 2012-06-01 NOTE — ED Provider Notes (Signed)
History     CSN: [id number]  Arrival date & time 06/01/12  1129   First MD Initiated Contact with Patient 06/01/12 1216      Chief Complaint  Patient presents with  . Chest Pain    (Consider location/radiation/quality/duration/timing/severity/associated sxs/prior treatment) HPI The patient presents to the ER with chest pain. The patient was seen at [facility] for chest pain yesterday. The patient states that he was going to be admitted but checked out AMA. The patient states that he received nitroglycerin that helped with his symptoms but they would return. The patient has had intermittant chest pain over the last 2 weeks. The patient states that he has exertional symptoms as well. The patient states that he has no nausea, vomiting, abdominal pain, weakness, headache, sweating, diarrhea, or syncope. The patient states that he did not take any other medications prior to arrival.  Past Medical History  Diagnosis Date  . Hypertension   . Stroke   . Pneumonia     Past Surgical History  Procedure Date  . Appendectomy     No family history on file.  History  Substance Use Topics  . Smoking status: Current Every Day Smoker    Types: Cigarettes  . Smokeless tobacco: Not on file  . Alcohol Use: No      Review of Systems All other systems negative except as documented in the HPI. All pertinent positives and negatives as reviewed in the HPI.  Allergies  Review of patient's allergies indicates no known allergies.  Home Medications   Current Outpatient Rx  Name  Route  Sig  Dispense  Refill  . ASPIRIN 325 MG PO TABS   Oral   Take 325 mg by mouth daily.         [name] NITROGLYCERIN 0.4 MG SL SUBL   Sublingual   Place 1 tablet (0.4 mg total) under the tongue every 5 (five) minutes as needed for chest pain.   30 tablet   0     BP 148/90  Pulse 86  Temp 97.8 F (36.6 C) (Oral)  Resp 20  SpO2 95%  Physical Exam  Nursing note and vitals reviewed. Constitutional:  He is oriented to person, place, and time. He appears well-developed and well-nourished. No distress.  HENT:  Head: Normocephalic and atraumatic.  Mouth/Throat: Oropharynx is clear and moist.  Eyes: Pupils are equal, round, and reactive to light.  Neck: Normal range of motion. Neck supple.  Cardiovascular: Normal rate, regular rhythm and normal heart sounds.  Exam reveals no gallop and no friction rub.   No murmur heard. Pulmonary/Chest: Effort normal and breath sounds normal. No respiratory distress.  Neurological: He is alert and oriented to person, place, and time.  Skin: Skin is warm and dry.    ED Course  Procedures (including critical care time)  Labs Reviewed  CBC WITH DIFFERENTIAL - Abnormal; Notable for the following:    WBC 11.0 (*)     Neutro Abs 8.2 (*)     All other components within normal limits  BASIC METABOLIC PANEL - Abnormal; Notable for the following:    Potassium 3.3 (*)     Glucose, Bld 206 (*)     GFR calc non Af Amer 80 (*)     All other components within normal limits  POCT I-STAT TROPONIN I  TROPONIN I   Dg Chest 2 View  06/01/2012  *RADIOLOGY REPORT*  Clinical Data: Chest pain  CHEST - 2 VIEW  Comparison:  May 31, 2012  Findings: The lungs clear.  The heart size and pulmonary vascularity are normal.  No adenopathy.  There is thoracic levoscoliosis.  IMPRESSION: No edema or consolidation.   Original Report Authenticated By: Bretta Bang, M.D.    Dg Chest 2 View  05/31/2012  *RADIOLOGY REPORT*  Clinical Data: Left sided chest pain and shortness of breath for 2 weeks  CHEST - 2 VIEW  Comparison: 03/11/2005  Findings: Heart size and vascular pattern are normal.  Lungs are clear.  Osseous thorax is intact.  IMPRESSION:  Negative   Original Report Authenticated By: Esperanza Heir, M.D.      1. Chest pain       MDM  MDM Reviewed: nursing note, vitals and previous chart Reviewed previous: labs and ECG Interpretation: labs, ECG and  x-ray Consults: admitting MD           Carlyle Dolly, PA-C 06/01/12 1517

## 2012-06-01 NOTE — Progress Notes (Addendum)
ANTICOAGULATION CONSULT NOTE - Initial Consult  Pharmacy Consult for heparin  Indication: chest pain/ACS  No Known Allergies  Patient Measurements: Height: 6' (182.9 cm) Weight: 235 lb (106.595 kg) IBW/kg (Calculated) : 77.6  Heparin Dosing Weight: 100kg Vital Signs: Temp: 97.8 F (36.6 C) (11/13 1136) Temp src: Oral (11/13 1136) BP: 148/90 mmHg (11/13 1215) Pulse Rate: 86  (11/13 1215)  Labs:  Basename 06/01/12 1225 06/01/12 1157 05/31/12 0940  HGB -- 15.5 14.8  HCT -- 46.2 44.2  PLT -- 202 197  APTT -- -- --  LABPROT -- -- --  INR -- -- --  HEPARINUNFRC -- -- --  CREATININE -- 1.02 1.00  CKTOTAL -- -- 112  CKMB -- -- 4.1*  TROPONINI <0.30 -- <0.30    Estimated Creatinine Clearance: 102 ml/min (by C-G formula based on Cr of 1.02).   Medical History: Past Medical History  Diagnosis Date  . Hypertension   . Stroke   . Pneumonia     Medications:  Asa SL NTG  Assessment: 56 year old male seen at [facility] last night for chest pain/acs and left AMA. He returns to [facility] with continued chest pain concerning for ACS. Orders to initiate IV heparin.. CBC is within normal limits and he was not taking any anticoagulants prior to admission.  Of noted past medical history includes stroke.  Goal of Therapy:  Heparin level 0.3-0.7 units/ml Monitor platelets by anticoagulation protocol: Yes   Plan:  Give 4000 units bolus x 1 Start heparin infusion at 1400 units/hr Check anti-Xa level in 6 hours and daily while on heparin Continue to monitor H&H and platelets  Severiano Gilbert 06/01/2012,3:20 PM  Follow up heparin level was undetectable. No IV issues noted, nor has there been any bleeding issues noted by nursing. Cardiac enzymes have been negative.Plan is for cath in the am.   Plan: Rebolus 2000 units x 1 Increase heparin to 1800 units/hr - recheck heparin level in am.  Severiano Gilbert  06/01/2012 10:01 PM

## 2012-06-01 NOTE — ED Notes (Signed)
Pt was seen at 67 yesterday by dr. Radford Pax and was advised to be admitted but could not.  Pt is here with left sided chest pain and heaviness with sob.

## 2012-06-01 NOTE — ED Notes (Signed)
Return from xray

## 2012-06-01 NOTE — Consult Note (Signed)
Cardiology Consult Note   Patient ID: [name] MRN: [id number], DOB/AGE: Dec 06, 1955   Admit date: 06/01/2012 Date of Consult: 06/01/2012  Primary Physician: No primary provider on file. Primary Cardiologist: New to cardiology - seen in consultation by Dr. Diona Browner on call for [place]  Reason for consult: evaluation/management of chest pain  HPI: [name] is a 56yo male with PMHx s/f DM, HL (both diet-controlled), HTN, tobacco abuse, family history of CAD (father MI 36s- deceased, brother MI at 60) and history of TIA who presented to [facility] ED with worsening chest pain. Of note, patient was incarcerated from 2007-2010 and stopped taking antihypertensives afterwards.   He reports experiencing left-sided chest pressure radiating to his left arm with exertion (walking from front door to car), and relieved with rest w/ assoc SOB, diaphoresis and lightheadedness occurring 3 weeks ago. Since that time, he reports the episodes have increased in severity, duration and frequency. This has limited his activity level. He reports he has used cocaine 3-4 times during this span, most recently on Sunday. No prior caths. Normal stress test ~ 10 years ago. Denies palpitations, LEE, orthopnea, PND, n/v/d, fevers, chills. No active bleeding. Ongoing tobacco abuse - 1 PPD. He thus presented to the ED for further evaluation.  In the ED, EKG reveals nonspecific changes, no evidence of ischemia. Initial trop-I WNL. BMET reveals a mild hypokalemia at 3.3. CBC with a very mild leukocytosis 11.0. CXR w/o acute changes. Two subsequent trop-I have returned WNL. Lipid panel reveals LDL 99, HDL 24, TG 307, TC 184.  Problem List: Past Medical History  Diagnosis Date  . Hypertension     Was on metoprolol, patient decided to stop taking it about two years ago  . TIA (transient ischemic attack) 1997    No residual neurological deficits.  . Pneumonia   . DM (diabetes mellitus) 2011    Was on metformin, but  patient stopped taking it after 4 mo due to diarrhea  . Hepatitis late 1970s    Treated in the hospital, finished treatment course. No recurrence  since    Past Surgical History  Procedure Date  . Appendectomy     Allergies: No Known Allergies  Home Medications: Prior to Admission medications   Medication Sig Start Date End Date Taking? Authorizing Provider  aspirin 325 MG tablet Take 325 mg by mouth daily.   Yes Historical Provider, MD  nitroGLYCERIN (NITROSTAT) 0.4 MG SL tablet Place 1 tablet (0.4 mg total) under the tongue every 5 (five) minutes as needed for chest pain. 05/31/12  Yes Nelia Shi, MD   Inpatient Medications:     . aspirin EC  81 mg Oral Daily  . atorvastatin  20 mg Oral q1800  . [COMPLETED] heparin  4,000 Units Intravenous Once  . insulin aspart  0-9 Units Subcutaneous Q4H  . metoprolol tartrate  12.5 mg Oral BID  . [COMPLETED]  morphine injection  4 mg Intravenous Once  . [COMPLETED] potassium chloride  40 mEq Oral Once  . sodium chloride  3 mL Intravenous Q12H  . [DISCONTINUED] heparin  5,000 Units Subcutaneous Q8H  . [DISCONTINUED] sodium chloride  3 mL Intravenous Q12H   Prescriptions prior to admission  Medication Sig Dispense Refill  . aspirin 325 MG tablet Take 325 mg by mouth daily.      . nitroGLYCERIN (NITROSTAT) 0.4 MG SL tablet Place 1 tablet (0.4 mg total) under the tongue every 5 (five) minutes as needed for chest pain.  30  tablet  0   Family History  Problem Relation Age of Onset  . Hypertension    . Diabetes    . Stroke    . Cancer Father     Lung  . Cancer Mother     Thyroid  . Cancer Maternal Grandmother     Breast  . Cancer Maternal Grandfather     Throat and stomach    History   Social History  . Marital Status: Single    Spouse Name: N/A    Number of Children: N/A  . Years of Education: N/A   Occupational History  . Not on file.   Social History Main Topics  . Smoking status: Current Every Day Smoker    Types:  Cigarettes  . Smokeless tobacco: Not on file     Comment: Patient does not think it is a problem for him at this time.  . Alcohol Use: No  . Drug Use: Yes    Special: Cocaine  . Sexually Active: Not on file   Other Topics Concern  . Not on file   Social History Narrative  . No narrative on file    Review of Systems: General: positive for diaphoresis, negative for chills, fever, night sweats or weight changes.  Cardiovascular: positive for chest pain, SOB, negative for, edema, orthopnea, palpitations, paroxysmal nocturnal dyspnea Dermatological:  negative for rash Respiratory: negative for cough or wheezing Urologic: negative for hematuria Abdominal: negative for nausea, vomiting, diarrhea, bright red blood per rectum, melena, or hematemesis Neurologic:  negative for visual changes, syncope, or dizziness All other systems reviewed and are otherwise negative except as noted above.  Physical Exam: Blood pressure 130/76, pulse 88, temperature 98 F (36.7 C), temperature source Oral, resp. rate 18, height 6' (1.829 m), weight 106.595 kg (235 lb), SpO2 96.00%.    General: Well developed, well nourished, in no acute distress. Head: Normocephalic, atraumatic, sclera non-icteric, no xanthomas, nares are without discharge. Neck: + left carotid bruis. JVD not elevated. Lungs: Clear bilaterally to auscultation without wheezes, rales, or rhonchi. Breathing is unlabored. Heart: RRR with S1 S2. No murmurs, rubs, or gallops appreciated. Abdomen: Soft, non-tender, non-distended with normoactive bowel sounds. No hepatomegaly. No rebound/guarding. No obvious abdominal masses. Msk:  Strength and tone appears normal for age. Extremities: No clubbing, cyanosis or edema.  Distal pedal pulses are 2+ and equal bilaterally. Neuro: Alert and oriented X 3. Moves all extremities spontaneously. Psych:  Responds to questions appropriately with a normal affect.  Labs: Recent Labs  [facility] 06/01/12 1157  05/31/12 0940   WBC 11.0* 10.5   HGB 15.5 14.8   HCT 46.2 44.2   MCV 84.8 83.1   PLT 202 197   Lab 06/01/12 1157 05/31/12 0940  NA 136 137  K 3.3* 3.5  CL 100 103  CO2 24 22  BUN 10 15  CREATININE 1.02 1.00  CALCIUM 9.0 9.0  PROT -- 7.2  BILITOT -- 0.3  ALKPHOS -- 93  ALT -- 15  AST -- 13  AMYLASE -- --  LIPASE -- --  GLUCOSE 206* 187*   Recent Labs  Basename 06/01/12 1508 06/01/12 1225 05/31/12 0940   CKTOTAL -- -- 112   CKMB -- -- 4.1*   CKMBINDEX -- -- --   TROPONINI <0.30 <0.30 <0.30   Recent Labs  Basename 06/01/12 1618   CHOL 184   HDL 24*   LDLCALC 99   TRIG 307*   CHOLHDL 7.7   LDLDIRECT --   Radiology/Studies:  Dg Chest 2 View  06/01/2012  *RADIOLOGY REPORT*  Clinical Data: Chest pain  CHEST - 2 VIEW  Comparison:  May 31, 2012  Findings: The lungs clear.  The heart size and pulmonary vascularity are normal.  No adenopathy.  There is thoracic levoscoliosis.  IMPRESSION: No edema or consolidation.   Original Report Authenticated By: Bretta Bang, M.D.    Dg Chest 2 View  05/31/2012  *RADIOLOGY REPORT*  Clinical Data: Left sided chest pain and shortness of breath for 2 weeks  CHEST - 2 VIEW  Comparison: 03/11/2005  Findings: Heart size and vascular pattern are normal.  Lungs are clear.  Osseous thorax is intact.  IMPRESSION:  Negative   Original Report Authenticated By: Esperanza Heir, M.D.    EKG: NSR, 84 bpm, IVCD, septal Q waves, nonspecific ST/T changes  ASSESSMENT AND PLAN:   [name] is a 56yo male with PMHx s/f DM, HL (both diet-controlled), HTN, tobacco abuse, family history of CAD (father MI 10s- deceased, brother MI at 11) and history of TIA who presented to [facility] ED with worsening chest pain in the setting of recent cocaine use.   1. Unstable angina- in the setting of cocaine use. Patient does have several significant cardiac RFs and may very well have underlying CAD. No prior invasive cardiac ischemic evaluation. He has a  significant family history of premature CAD, ongoing tobacco abuse. Limiting factors will be poor lifestyle choices. Also has a history of medication noncompliance. Trop-I WNL. EKG without ischemic changes. Given HPI concerning for USAP, significant cardiac RFs and ongoing pain this admission, will plan for cath tomorrow after discussing with MD. Continue ASA, statin, heparin & NTG gtt. NO BB.  2. Diet-controlled DM- per primary team. Check Hgb A1C for further stratification.   3. HTN- start on ACEi with underlying DM.   4. Hyperlipidemia- LDL 99. Lipitor started.   5. Hypokalemia- replete  6. Tobacco abuse- stressed cessation. Nicotine patch for assistance.   7. Cocaine use- long discussion regarding cardiac implications of this. Understands and is willing to quit. Certainly contributing to his angina. NO BB.   8. Medication noncompliance- stressed importance of adherence going forward. Should he receive DES/BMS, he will need to continue DAPT.   Signed, R. Hurman Horn, PA-C 06/01/2012, 6:52 PM   Attending note:  Patient seen and examined. Reviewed his history including hypertension, DM2, hyperlipidemia, tobacco abuse, and a long-standing history of substance abuse including intermittent cocaine dating back to the late 1980s. He also has a family history of premature CAD in first degree relatives. He was admitted to the hospital today reporting approximately 3 week history of intermittent chest tightness with radiation to the left arm, occurs with exertion, up to 4-5 times a day. He states that he has used cocaine during this time span, previously did not experience similar symptoms with drug use. He has no personal history of CAD, states he had a stress test approximately 10 years ago, reportedly normal.  At this point he does not have a primary care physician, has been noncompliant with regular medication use other than aspirin and as needed nitroglycerin. He was incarcerated between  2007 and 2010.  His ECG is nonspecific with subtle ST segment changes in the anterior leads, troponin I levels have been normal so far. He has been treated with intravenous nitroglycerin and heparin, still has exertional chest tightness when walking from his bed to the bathroom during this hospitalization.  Symptoms are concerning for unstable angina. Certainly active,  intermittent cocaine use may be implicated, however his baseline cardiac risk factor profile is robust, and he could also have advanced, obstructive CAD as well. I had a frank discussion with him regarding the situation and potential for further evaluation. We discussed the risks and benefits of diagnostic cardiac catheterization. Certainly, cessation of substance use will be imperative, no matter the findings of his cardiac catheterization; however if he does have significant obstructive CAD that requires further revascularization, this will need to be carefully considered by the interventionalist as it relates to method and mode, bearing in mind his history of noncompliance over the years and substance abuse. Our cardiology team will continue to follow him during hospitalization and can arrange appropriate outpatient followup with our [facility] office as indicated.  Jonelle [name], M.D., F.A.C.C.

## 2012-06-01 NOTE — ED Provider Notes (Signed)
Medical screening examination/treatment/procedure(s) were performed by non-physician practitioner and as supervising physician I was immediately available for consultation/collaboration.   Celene Kras, MD 06/01/12 [phone]

## 2012-06-02 ENCOUNTER — Encounter (HOSPITAL_COMMUNITY)
Admission: EM | Discharge status: Against Medical Advice | Payer: Self-pay | Source: Home / Self Care | Attending: Internal Medicine

## 2012-06-02 ENCOUNTER — Encounter (HOSPITAL_COMMUNITY): Payer: Self-pay | Admitting: Cardiology

## 2012-06-02 DIAGNOSIS — R072 Precordial pain: Secondary | ICD-10-CM

## 2012-06-02 DIAGNOSIS — I2510 Atherosclerotic heart disease of native coronary artery without angina pectoris: Secondary | ICD-10-CM

## 2012-06-02 HISTORY — PX: LEFT HEART CATHETERIZATION WITH CORONARY ANGIOGRAM: SHX5451

## 2012-06-02 LAB — GLUCOSE, CAPILLARY
Glucose-Capillary: 129 mg/dL — ABNORMAL HIGH (ref 70–99)
Glucose-Capillary: 103 mg/dL — ABNORMAL HIGH (ref 70–99)

## 2012-06-02 LAB — COMPREHENSIVE METABOLIC PANEL
Albumin: 3 g/dL — ABNORMAL LOW (ref 3.5–5.2)
BUN: 13 mg/dL (ref 6–23)
Creatinine, Ser: 1.12 mg/dL (ref 0.50–1.35)
Total Protein: 6.4 g/dL (ref 6.0–8.3)

## 2012-06-02 LAB — MRSA PCR SCREENING: MRSA by PCR: NEGATIVE

## 2012-06-02 LAB — CBC
HCT: 43.5 % (ref 39.0–52.0)
MCV: 85.1 fL (ref 78.0–100.0)
RBC: 5.11 MIL/uL (ref 4.22–5.81)
WBC: 9.4 10*3/uL (ref 4.0–10.5)

## 2012-06-02 LAB — HEMOGLOBIN A1C: Hgb A1c MFr Bld: 6.3 % — ABNORMAL HIGH (ref <5.7)

## 2012-06-02 SURGERY — LEFT HEART CATHETERIZATION WITH CORONARY ANGIOGRAM
Anesthesia: LOCAL

## 2012-06-02 MED ORDER — NITROGLYCERIN IN D5W 200-5 MCG/ML-% IV SOLN: 5.0000 ug/min | INTRAVENOUS | Status: DC

## 2012-06-02 MED ORDER — DILTIAZEM HCL 30 MG PO TABS
30.0000 mg | ORAL_TABLET | Freq: Four times a day (QID) | ORAL | Status: DC
  Administered 2012-06-02: 30 mg via ORAL
  Filled 2012-06-02 (×4): qty 1

## 2012-06-02 MED ORDER — MORPHINE SULFATE 2 MG/ML IJ SOLN
INTRAMUSCULAR | Status: AC
  Filled 2012-06-02: qty 1

## 2012-06-02 MED ORDER — DILTIAZEM HCL 60 MG PO TABS
60.0000 mg | ORAL_TABLET | Freq: Four times a day (QID) | ORAL | Status: DC
  Administered 2012-06-02 – 2012-06-03 (×2): 60 mg via ORAL
  Filled 2012-06-02 (×6): qty 1

## 2012-06-02 MED ORDER — CLOPIDOGREL BISULFATE 75 MG PO TABS
75.0000 mg | ORAL_TABLET | Freq: Every day | ORAL | Status: DC
  Administered 2012-06-03: 75 mg via ORAL
  Filled 2012-06-02 (×2): qty 1

## 2012-06-02 MED ORDER — CLOPIDOGREL BISULFATE 300 MG PO TABS
ORAL_TABLET | ORAL | Status: AC
  Filled 2012-06-02: qty 1

## 2012-06-02 MED ORDER — LISINOPRIL 2.5 MG PO TABS
2.5000 mg | ORAL_TABLET | Freq: Every day | ORAL | Status: DC
  Filled 2012-06-02 (×2): qty 1

## 2012-06-02 MED ORDER — HEPARIN (PORCINE) IN NACL 100-0.45 UNIT/ML-% IJ SOLN
1600.0000 [IU]/h | INTRAMUSCULAR | Status: DC
  Administered 2012-06-02: 1600 [IU]/h via INTRAVENOUS
  Filled 2012-06-02: qty 250

## 2012-06-02 MED ORDER — HEPARIN (PORCINE) IN NACL 2-0.9 UNIT/ML-% IJ SOLN
INTRAMUSCULAR | Status: AC
  Filled 2012-06-02: qty 1500

## 2012-06-02 MED ORDER — ACETAMINOPHEN 325 MG PO TABS: 650.0000 mg | ORAL_TABLET | ORAL | Status: DC | PRN

## 2012-06-02 MED ORDER — SODIUM CHLORIDE 0.9 % IV SOLN: INTRAVENOUS | Status: AC

## 2012-06-02 MED ORDER — FENTANYL CITRATE 0.05 MG/ML IJ SOLN
INTRAMUSCULAR | Status: AC
  Filled 2012-06-02: qty 2

## 2012-06-02 MED ORDER — LIDOCAINE HCL (PF) 1 % IJ SOLN
INTRAMUSCULAR | Status: AC
  Filled 2012-06-02: qty 30

## 2012-06-02 MED ORDER — ONDANSETRON HCL 4 MG/2ML IJ SOLN: 4.0000 mg | Freq: Four times a day (QID) | INTRAMUSCULAR | Status: DC | PRN

## 2012-06-02 MED ORDER — ISOSORBIDE MONONITRATE ER 30 MG PO TB24
30.0000 mg | ORAL_TABLET | Freq: Every day | ORAL | Status: DC
  Administered 2012-06-03: 30 mg via ORAL
  Filled 2012-06-02 (×2): qty 1

## 2012-06-02 MED ORDER — NITROGLYCERIN 0.2 MG/ML ON CALL CATH LAB
INTRAVENOUS | Status: AC
  Filled 2012-06-02: qty 1

## 2012-06-02 MED ORDER — MIDAZOLAM HCL 2 MG/2ML IJ SOLN
INTRAMUSCULAR | Status: AC
  Filled 2012-06-02: qty 2

## 2012-06-02 NOTE — Progress Notes (Signed)
Long post cath discussion with patient.  He is unemployed and challenged with regard to the idea of any medications.  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  He has been using cocaine regularly, up to twice weekly, and more heavily over the past six months.  He says he cannot buy medications.    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  It would require probably 38mm of stent to cover the disease, and so a BMS would have a fairly high rate of TVR.  DES would require 12 months and perhaps more of dual antiplatelet therapy, and no cocaine use  (Additional spasm).  Dr. Excell Seltzer and I reviewed earlier today, and given all the circumstances wondered if he should not have a trial of medical therapy as the potential for stent thrombosis is enormous.  I deferred today in order to have a lengthy conversation with the patien regarding treatment options.  This could include plavix  (See if complies and see if he is also responsive).  Would add nitrates, and diltiazem to regimen.  As he occluded the vessel in the lab, I would keep until preferably until Sunday to make sure he has no recurrence on medical therapy.    Will stop heparin at present, and resume only if recurrent symptoms.  Increase dilt and add nitrates.  Check P2Y12 in am.   Will reassess in the am.    Time: 38 minutes.

## 2012-06-02 NOTE — H&P (View-Only) (Signed)
   Subjective:  Denies CP or dyspnea   Objective:  Filed Vitals:   06/01/12 1637 06/01/12 1740 06/01/12 2100 06/02/12 0500  BP: 155/81 130/76 128/68 147/74  Pulse: 86 88 76 73  Temp: 98 F (36.7 C)  98.6 F (37 C) 97.9 F (36.6 C)  TempSrc:      Resp: 18  18 20   Height:      Weight:    225 lb 5 oz (102.2 kg)  SpO2: 96%  94% 92%    Intake/Output from previous day:  Intake/Output Summary (Last 24 hours) at 06/02/12 6295 Last data filed at 06/02/12 0300  Gross per 24 hour  Intake      0 ml  Output    800 ml  Net   -800 ml    Physical Exam: Physical exam: Well-developed well-nourished in no acute distress.  Skin is warm and dry.  HEENT is normal.  Neck is supple.  Chest is clear to auscultation with normal expansion.  Cardiovascular exam is regular rate and rhythm. 1/6 systolic murmur Abdominal exam nontender or distended. No masses palpated. Extremities show no edema. neuro grossly intact    Lab Results: Basic Metabolic Panel:  Basename 06/02/12 0250 06/01/12 1157  NA 139 136  K 3.7 3.3*  CL 104 100  CO2 27 24  GLUCOSE 127* 206*  BUN 13 10  CREATININE 1.12 1.02  CALCIUM 8.9 9.0  MG -- --  PHOS -- --   CBC:  Basename 06/02/12 0250 06/01/12 1157  WBC 9.4 11.0*  NEUTROABS -- 8.2*  HGB 14.6 15.5  HCT 43.5 46.2  MCV 85.1 84.8  PLT 181 202   Cardiac Enzymes:  Basename 06/02/12 0250 06/01/12 2058 06/01/12 1508 05/31/12 0940  CKTOTAL -- -- -- 112  CKMB -- -- -- 4.1*  CKMBINDEX -- -- -- --  TROPONINI <0.30 <0.30 <0.30 --     Assessment/Plan:  1 UA - enzymes negative; for cath today; risks and benefits discussed and patient agrees to proceed. Continue ASA, heparin, NTG and statin; if PCI required, would use BMS given h/o noncompliance and substance abuse. Patient instructed on risks of not taking meds including stent thrombosis if stent required 2 DM - management per primary care 3 Substance abuse - pt counseled on discontinuing. 4 Hypertension -  add low dose ACEI particularly in light of DM; will avoid beta blocker with cocaine use.  Olga Millers 06/02/2012, 7:22 AM

## 2012-06-02 NOTE — Progress Notes (Signed)
Subjective: He had a cath today which revealed CAD and a long segmental disease of the circumflex with focal rupture in a sub-branch. He had a lengthy discussion with Cardiology in regards to medication compliance if he had stent implanted and was found not to be a good candidate for coronary artery stenting at this time.  He was drowsy shorlty after his cardiac cath but slowly recovered from the effects of anesthesia. He denied chest pain, shortness of breath, or abdominal pain.  Objective: Vital signs in last 24 hours: Filed Vitals:   06/02/12 1430 06/02/12 1445 06/02/12 1500 06/02/12 1600  BP: 157/88 150/86 130/68 147/98  Pulse: 76 80 84 86  Temp:    98.1 F (36.7 C)  TempSrc:    Oral  Resp: 18 18 17 22   Height:      Weight:      SpO2: 93% 93% 91% 93%   Weight change:   Intake/Output Summary (Last 24 hours) at 06/02/12 1809 Last data filed at 06/02/12 1600  Gross per 24 hour  Intake    819 ml  Output    800 ml  Net     19 ml   Vitals reviewed. General: resting in bed,  In NAD HEENT:  no scleral icterus Cardiac: RRR, no rubs, I/VI SEM, heard best at the LUSB, no gallops Pulm: clear to auscultation bilaterally, no wheezes, rales, or rhonchi Abd: soft, nontender, nondistended, BS present Ext: warm and well perfused, no pedal edema Neuro: alert and oriented X3, cranial nerves II-XII grossly intact, strength and sensation to light touch equal in bilateral upper and lower extremities  Lab Results: Basic Metabolic Panel:  Lab 06/02/12 1610 06/01/12 1157  NA 139 136  K 3.7 3.3*  CL 104 100  CO2 27 24  GLUCOSE 127* 206*  BUN 13 10  CREATININE 1.12 1.02  CALCIUM 8.9 9.0  MG -- --  PHOS -- --   Liver Function Tests:  Lab 06/02/12 0250 05/31/12 0940  AST 13 13  ALT 13 15  ALKPHOS 83 93  BILITOT 0.3 0.3  PROT 6.4 7.2  ALBUMIN 3.0* 3.4*   CBC:  Lab 06/02/12 0250 06/01/12 1157  WBC 9.4 11.0*  NEUTROABS -- 8.2*  HGB 14.6 15.5  HCT 43.5 46.2  MCV 85.1 84.8  PLT  181 202   Cardiac Enzymes:  Lab 06/02/12 0250 06/01/12 2058 06/01/12 1508 05/31/12 0940  CKTOTAL -- -- -- 112  CKMB -- -- -- 4.1*  CKMBINDEX -- -- -- --  TROPONINI <0.30 <0.30 <0.30 --   BNP:  Lab 05/31/12 0940  PROBNP 128.8*   CBG:  Lab 06/02/12 1630 06/02/12 0946 06/02/12 0714 06/02/12 0359 06/02/12 0003 06/01/12 1945  GLUCAP 151* 103* 129* 149* 170* 167*   Hemoglobin A1C:  Lab 06/01/12 1619  HGBA1C 6.3*   Fasting Lipid Panel:  Lab 06/01/12 1618  CHOL 184  HDL 24*  LDLCALC 99  TRIG 960*  CHOLHDL 7.7  LDLDIRECT --   Thyroid Function Tests:  Lab 06/01/12 1619  TSH 1.020  T4TOTAL --  FREET4 --  T3FREE --  THYROIDAB --   Coagulation:  Lab 06/01/12 1619  LABPROT 13.9  INR 1.08   Drugs of Abuse     Component Value Date/Time   LABOPIA POSITIVE* 06/01/2012 2217   COCAINSCRNUR POSITIVE* 06/01/2012 2217   LABBENZ NONE DETECTED 06/01/2012 2217   AMPHETMU NONE DETECTED 06/01/2012 2217   THCU NONE DETECTED 06/01/2012 2217   LABBARB NONE DETECTED 06/01/2012 2217  Micro Results: Recent Results (from the past 240 hour(s))  MRSA PCR SCREENING     Status: Normal   Collection Time   06/02/12  2:24 PM      Component Value Range Status Comment   MRSA by PCR NEGATIVE  NEGATIVE Final    Studies/Results: Dg Chest 2 View  06/01/2012  *RADIOLOGY REPORT*  Clinical Data: Chest pain  CHEST - 2 VIEW  Comparison:  May 31, 2012  Findings: The lungs clear.  The heart size and pulmonary vascularity are normal.  No adenopathy.  There is thoracic levoscoliosis.  IMPRESSION: No edema or consolidation.   Original Report Authenticated By: Bretta Bang, M.D.    Cardiac Cath:  06/02/12 Coronary angiography:  Coronary dominance: left  Left mainstem: Short, nearly separate ostia, and free of disease  Left anterior descending (LAD): Courses to the apex. Mild diffuse plaque is noted throughout the vessel. Just after the septal is a 40% area of narrowing, and after the  major diagonal also 40% narrowing. There is a small first diagonal that is less than 1.35mm and has 70% narrowing and 50% narrowing.  Left circumflex (LCx): The vessel is a dominant artery. It provides a tiny OM1 that is insignificant. The large OM2 bifurcates. The superior branch is without major disease but is small. The inferior branch is large and has segmental plaque of 40%, then an eccentric 80% ulcerative lesion. More distally is a hypodense 75 % lesion then distally in the OM subbranch is a 50% area of disease. This segmental disease extends probably over 40 mm or more. The vessel occluded during the procedure then opened up with NTG easily. The distal vessel is moderate in size. There are then 3 PL branches and a PDA. The 3 PL branches have mild luminal irregularities. The moderate size PDA has 50% narrowing.  Right coronary artery (RCA): 40% mid narrowing in a non dominant artery. A conus branch also has 60% narrowing.  Left ventriculography: Left ventricular systolic function is normal, LVEF is estimated at 55-65%, there is no significant mitral regurgitation   Final Conclusions:  1. Normal LV function  2. 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 Other disease as noted.   Recommendations:  1. Dr. Excell Seltzer and I reviewed this in detail. To treat the OM he would likely need a 38mm stent to cover the area adequately, and we have concerns about reliability due to cocaine issues that are present and chronic. He also has considerable spasm. We will get him started on calcium channel blockers and nitrates, and add clopidogrel and check P2Y12. He is uninsured and medication availability and compliance are of concern, especially with a 38 mm stent. The risk of TVR would be high with BMS so DES is favored, but with his issues the likelihood of ST is of concern with medication compliance. He said he was on BP meds while incarcerated, but stopped them as well. We  will start him on treatment and discuss the options with the patient in detail. He does not want his family involved in the discussions  Medications: I have reviewed the patient's current medications. Scheduled Meds:   . [COMPLETED] aspirin  324 mg Oral Pre-Cath  . aspirin EC  81 mg Oral Daily  . atorvastatin  20 mg Oral q1800  . [COMPLETED] clopidogrel      . clopidogrel  75 mg Oral Q breakfast  . diltiazem  30 mg Oral Q6H  . [  COMPLETED] fentaNYL      . [COMPLETED] heparin      . [COMPLETED] heparin  2,000 Units Intravenous Once  . insulin aspart  0-9 Units Subcutaneous Q4H  . [COMPLETED] lidocaine      . lisinopril  2.5 mg Oral Daily  . [COMPLETED] midazolam      . [COMPLETED] nitroGLYCERIN      . sodium chloride  3 mL Intravenous Q12H  . [DISCONTINUED] aspirin EC  81 mg Oral Daily  . [DISCONTINUED] diazepam  5 mg Oral On Call  . [DISCONTINUED] metoprolol tartrate  12.5 mg Oral BID  . [DISCONTINUED] sodium chloride  3 mL Intravenous Q12H   Continuous Infusions:   . sodium chloride    . heparin 1,600 Units/hr (06/02/12 1757)  . nitroGLYCERIN    . [DISCONTINUED] sodium chloride 75 mL/hr at 06/02/12 0508  . [DISCONTINUED] heparin 1,800 Units/hr (06/02/12 0508)  . [DISCONTINUED] nitroGLYCERIN Stopped (06/02/12 0804)   PRN Meds:.sodium chloride, acetaminophen, acetaminophen, morphine injection, nitroGLYCERIN, ondansetron (ZOFRAN) IV, sodium chloride, [DISCONTINUED] sodium chloride, [DISCONTINUED] acetaminophen, [DISCONTINUED] sodium chloride Assessment/Plan: Mr. Schrieber is a 56 yo man with history of HTN, DM, and TIA presented to the ED with 3-week history of substernal intermittent chest pain.   1. Unstable angina: He presented to the [facility] with unstable angina: intermittent substernal CP and SOB with exertion, recently worsened to acute onset even at rest. He has multiple risk factors such as reported HTN, DM, hyperlipidemia, smoking, and cocaine use. Cocaine vasospasm also a  possibility with at least worsening of unstable angina secondary to vasospasms. He does deny using cocaine 3 weeks ago, however, when the chest pain first started. His UDS is positive for cocaine.  His cardiac cath today showed LAD with OM2 branch with ulcerative lesion and CAD that would likely require a 38mm stent to cover the area adequately. However, he did not have a stent placed. After extensive discussion with Dr. Riley Kill, in Interventional Cardiology, Mr. Demetriou was found not to be a good candidate for coronary stent secondary to poor prognosis for medication compliance due to financial hardship and his high risk of stent thrombosis. He will have medical management at this time with diltiazem, nitrates, and clopidogrel.  -Troponin negative x4, no chest pain today. -TSH 1.020 (normal), HgBA1C 6.3 (pre-diabetes), lipid panel with LDL of 99 (goal is <70) Plan: -Cardiology consulted, appreciate help managing this patient  -Continue Plavix 75mg  daily, Diltiazem 60mg  q6hr, Imdur 30mg  daily, started on Lisinopril 2.5mg  daily -morphine 2mg  q3h and acetaminophen 650mg  q6hr prn for pain  -nitroglycerin SL PRN  -continue ASA  -D/cd  heparin drip  -replete K as needed -Will f/u 2D echo  -f/u CBC, CMP, coag, A1C, lipid penal, TSH  -Continue Lipitor for cardiac protection   2. HTN: reported hx of HTN, used to be on metoprolol. He stopped taking it about two years ago. BP has been ranging from 136~170/81~92. Patient takes metoprolol at home. Given history of cocaine, will discontinue metoprolol. Per Cardiology he is now on Lisinopril, Imdur, and diltiazem.    3. DM: per patient, he was diagnosed in 2011. Taken metformin for 4 months and stopped due to diarrhea. Then lost to follow up. A1C of 6.4% (pre-diabetes),  -On SSI (sensitive), CBGs below 200, he has not received insulin correction  4. Diet: heart healthy   5. PPx: SCDs today. Will start heparin SQ tomorrow.   Dispo. Pending further clinical  improvement and Cardiology recommendations.     LOS: 1 day  Sara Chu D 06/02/2012, 6:09 PM

## 2012-06-02 NOTE — Interval H&P Note (Signed)
History and Physical Interval Note:  06/02/2012 8:33 AM  [name]  has presented today for surgery, with the diagnosis of cp  The various methods of treatment have been discussed with the patient.  After consideration of risks, benefits and other options for treatment, the patient has consented to  Procedure(s) (LRB) with comments: LEFT HEART CATHETERIZATION WITH CORONARY ANGIOGRAM (N/A) as a surgical intervention .  The patient's history has been reviewed, patient examined, no change in status, stable for surgery.  I have reviewed the patient's chart and labs.  Questions were answered to the patient's satisfaction.     Shawnie Pons

## 2012-06-02 NOTE — Progress Notes (Signed)
Nursing: Patient very agitated and upset with the delay in receiving a cup of water and " Im tried of being here" . Patient states "Lavenia Atlas been waiting since three o' clock for a cup of water". Patient's brother at bedside and encouraged patient to calm down. He also reports complaint of getting "tied up in all these lines". This nurse used active listening skills to calm patient and be supportive of his needs. Room was rearranged to fit is needs and not get tangled up in the lines. Patient was very thankful . Called internal medicine regarding the patient's desire to leave AMA. Dr. Garald Braver made aware and planned on seeing patient.

## 2012-06-02 NOTE — Progress Notes (Signed)
   Subjective:  Denies CP or dyspnea   Objective:  Filed Vitals:   06/01/12 1637 06/01/12 1740 06/01/12 2100 06/02/12 0500  BP: 155/81 130/76 128/68 147/74  Pulse: 86 88 76 73  Temp: 98 F (36.7 C)  98.6 F (37 C) 97.9 F (36.6 C)  TempSrc:      Resp: 18  18 20  Height:      Weight:    225 lb 5 oz (102.2 kg)  SpO2: 96%  94% 92%    Intake/Output from previous day:  Intake/Output Summary (Last 24 hours) at 06/02/12 0722 Last data filed at 06/02/12 0300  Gross per 24 hour  Intake      0 ml  Output    800 ml  Net   -800 ml    Physical Exam: Physical exam: Well-developed well-nourished in no acute distress.  Skin is warm and dry.  HEENT is normal.  Neck is supple.  Chest is clear to auscultation with normal expansion.  Cardiovascular exam is regular rate and rhythm. 1/6 systolic murmur Abdominal exam nontender or distended. No masses palpated. Extremities show no edema. neuro grossly intact    Lab Results: Basic Metabolic Panel:  Basename 06/02/12 0250 06/01/12 1157  NA 139 136  K 3.7 3.3*  CL 104 100  CO2 27 24  GLUCOSE 127* 206*  BUN 13 10  CREATININE 1.12 1.02  CALCIUM 8.9 9.0  MG -- --  PHOS -- --   CBC:  Basename 06/02/12 0250 06/01/12 1157  WBC 9.4 11.0*  NEUTROABS -- 8.2*  HGB 14.6 15.5  HCT 43.5 46.2  MCV 85.1 84.8  PLT 181 202   Cardiac Enzymes:  Basename 06/02/12 0250 06/01/12 2058 06/01/12 1508 05/31/12 0940  CKTOTAL -- -- -- 112  CKMB -- -- -- 4.1*  CKMBINDEX -- -- -- --  TROPONINI <0.30 <0.30 <0.30 --     Assessment/Plan:  1 UA - enzymes negative; for cath today; risks and benefits discussed and patient agrees to proceed. Continue ASA, heparin, NTG and statin; if PCI required, would use BMS given h/o noncompliance and substance abuse. Patient instructed on risks of not taking meds including stent thrombosis if stent required 2 DM - management per primary care 3 Substance abuse - pt counseled on discontinuing. 4 Hypertension -  add low dose ACEI particularly in light of DM; will avoid beta blocker with cocaine use.  Brian Crenshaw 06/02/2012, 7:22 AM    

## 2012-06-02 NOTE — H&P (Signed)
Internal Medicine Teaching Service Attending Note Date: 06/02/2012  Patient name: [name]  Medical record number: [id number]  Date of birth: 06-Oct-1955   I have seen and evaluated [name] and discussed their care with the Residency Team.    [name] is a 56yo man with history of HTN, DM, TIA who presented to the ED with a 3 week history of worsening substernal chest pain.  The pain was initially associated with exertion and would last only a few minutes and resolve with rest, ~ 8 times per day.  The pain would sometimes move to his left chest, should and arm.  Associated symptoms include SOB and dizziness. More recently the pain has worsened to occuring at rest and occasionally waking him up at night.  The day prior to admission, he present to the ED at [facility], however, he did not want to be admitted, so he was sent home with NTG which was moderately helpful along with aspirin.  The pain recurred and so he re-presented today.  He has not had pain like this before and has never been told he had CAD.  He denies fever, chills, weight loss, recent illness, cough, headache or change in urinary habits.  He does have mild swelling in his legs/feet at the end of the day.   He smokes 1 ppd and has been told he has HLD, but is not treated.  He also reports using cocaine frequently.  He has FH of a brother with CAD at age 62.   For further PMH, PSH, PFSH, Meds/allergies, please see resident note.   Physical Exam: Blood pressure 147/98, pulse 86, temperature 98.1 F (36.7 C), temperature source Oral, resp. rate 22, height 6' (1.829 m), weight 225 lb 5 oz (102.2 kg), SpO2 93.00%. General appearance: alert, cooperative and no distress Head: Normocephalic, without obvious abnormality, atraumatic Eyes: EOMI, no icterus Lungs: clear to auscultation bilaterally and no wheeze Heart: RR, NR, no murmur Abdomen: soft, NT, ND, +BS Extremities: extremities normal, atraumatic, no cyanosis  or edema Pulses: 2+ and symmetric Neurologic: Grossly normal  Lab results: Results for orders placed during the hospital encounter of 06/01/12 (from the past 24 hour(s))  GLUCOSE, CAPILLARY     Status: Abnormal   Collection Time   06/01/12  7:45 PM      Component Value Range   Glucose-Capillary 167 (*) 70 - 99 mg/dL  TROPONIN I     Status: Normal   Collection Time   06/01/12  8:58 PM      Component Value Range   Troponin I <0.30  <0.30 ng/mL  HEPARIN LEVEL (UNFRACTIONATED)     Status: Abnormal   Collection Time   06/01/12  8:59 PM      Component Value Range   Heparin Unfractionated <0.10 (*) 0.30 - 0.70 IU/mL  URINE RAPID DRUG SCREEN (HOSP PERFORMED)     Status: Abnormal   Collection Time   06/01/12 10:17 PM      Component Value Range   Opiates POSITIVE (*) NONE DETECTED   Cocaine POSITIVE (*) NONE DETECTED   Benzodiazepines NONE DETECTED  NONE DETECTED   Amphetamines NONE DETECTED  NONE DETECTED   Tetrahydrocannabinol NONE DETECTED  NONE DETECTED   Barbiturates NONE DETECTED  NONE DETECTED  GLUCOSE, CAPILLARY     Status: Abnormal   Collection Time   06/02/12 12:03 AM      Component Value Range   Glucose-Capillary 170 (*) 70 - 99 mg/dL  TROPONIN I  Status: Normal   Collection Time   06/02/12  2:50 AM      Component Value Range   Troponin I <0.30  <0.30 ng/mL  COMPREHENSIVE METABOLIC PANEL     Status: Abnormal   Collection Time   06/02/12  2:50 AM      Component Value Range   Sodium 139  135 - 145 mEq/L   Potassium 3.7  3.5 - 5.1 mEq/L   Chloride 104  96 - 112 mEq/L   CO2 27  19 - 32 mEq/L   Glucose, Bld 127 (*) 70 - 99 mg/dL   BUN 13  6 - 23 mg/dL   Creatinine, Ser 1.19  0.50 - 1.35 mg/dL   Calcium 8.9  8.4 - 14.7 mg/dL   Total Protein 6.4  6.0 - 8.3 g/dL   Albumin 3.0 (*) 3.5 - 5.2 g/dL   AST 13  0 - 37 U/L   ALT 13  0 - 53 U/L   Alkaline Phosphatase 83  39 - 117 U/L   Total Bilirubin 0.3  0.3 - 1.2 mg/dL   GFR calc non Af Amer 72 (*) >90 mL/min   GFR  calc Af Amer 83 (*) >90 mL/min  CBC     Status: Normal   Collection Time   06/02/12  2:50 AM      Component Value Range   WBC 9.4  4.0 - 10.5 K/uL   RBC 5.11  4.22 - 5.81 MIL/uL   Hemoglobin 14.6  13.0 - 17.0 g/dL   HCT 82.9  56.2 - 13.0 %   MCV 85.1  78.0 - 100.0 fL   MCH 28.6  26.0 - 34.0 pg   MCHC 33.6  30.0 - 36.0 g/dL   RDW 86.5  78.4 - 69.6 %   Platelets 181  150 - 400 K/uL  HEPARIN LEVEL (UNFRACTIONATED)     Status: Abnormal   Collection Time   06/02/12  2:50 AM      Component Value Range   Heparin Unfractionated <0.10 (*) 0.30 - 0.70 IU/mL  GLUCOSE, CAPILLARY     Status: Abnormal   Collection Time   06/02/12  3:59 AM      Component Value Range   Glucose-Capillary 149 (*) 70 - 99 mg/dL  GLUCOSE, CAPILLARY     Status: Abnormal   Collection Time   06/02/12  7:14 AM      Component Value Range   Glucose-Capillary 129 (*) 70 - 99 mg/dL  GLUCOSE, CAPILLARY     Status: Abnormal   Collection Time   06/02/12  9:46 AM      Component Value Range   Glucose-Capillary 103 (*) 70 - 99 mg/dL  MRSA PCR SCREENING     Status: Normal   Collection Time   06/02/12  2:24 PM      Component Value Range   MRSA by PCR NEGATIVE  NEGATIVE  GLUCOSE, CAPILLARY     Status: Abnormal   Collection Time   06/02/12  4:30 PM      Component Value Range   Glucose-Capillary 151 (*) 70 - 99 mg/dL    Imaging results:  Dg Chest 2 View  06/01/2012  *RADIOLOGY REPORT*  Clinical Data: Chest pain  CHEST - 2 VIEW  Comparison:  May 31, 2012  Findings: The lungs clear.  The heart size and pulmonary vascularity are normal.  No adenopathy.  There is thoracic levoscoliosis.  IMPRESSION: No edema or consolidation.   Original Report Authenticated By: Chrissie Noa  Margarita Grizzle, M.D.     Assessment and Plan: I agree with the formulated Assessment and Plan with the following changes:   1. Unstable angina - Admit to telemetry bed - Cardiology consult - High dose statin, asa, beta blocker held due to cocaine use -  Heparin IV - Cycle CE - Morphine for breakthrough pain - NTG drip for pain - TTE - F/U AML  Rest of plan as per resident note, limited PMH, will await labs and possible LHC per cardiology.   Inez Catalina, MD 11/14/20134:58 PM

## 2012-06-02 NOTE — Care Management Note (Signed)
    Page 1 of 1   06/02/2012     2:34:57 PM   CARE MANAGEMENT NOTE 06/02/2012  Patient:  JAHARI, HANSFORD   Account Number:  [id number]  Date Initiated:  06/02/2012  Documentation initiated by:  Junius Creamer  Subjective/Objective Assessment:   adm w ch pain     Action/Plan:   lives alone   Anticipated DC Date:     Anticipated DC Plan:        DC Planning Services  CM consult      Choice offered to / List presented to:             Status of service:   Medicare Important Message given?   (If response is "NO", the following Medicare IM given date fields will be blank) Date Medicare IM given:   Date Additional Medicare IM given:    Discharge Disposition:    Per UR Regulation:  Reviewed for med. necessity/level of care/duration of stay  If discussed at Long Length of Stay Meetings, dates discussed:    Comments:  11/14 [id number] debbie Kiera Hussey rn,bsn [phone] pt lives in [place] but states he would only come to [place] for health care. gave pt list of clinics in [place] if wants to find pcp. gave pt prescription discount card that may help w brand name meds. md notes no ins and will put on as many generics as possible.

## 2012-06-02 NOTE — CV Procedure (Signed)
Cardiac Catheterization Procedure Note  Name: [name] MRN: [id number] DOB: 1956/04/13  Procedure: Left Heart Cath, Selective Coronary Angiography, LV angiography  Indication: Patient with heavy cocaine use both chronic and acutely who presents with chest pain.     Procedural details: The right groin was prepped, draped, and anesthetized with 1% lidocaine. Using modified Seldinger technique, a 4 French sheath was introduced into the right femoral artery. Standard Judkins catheters were used for coronary angiography and left ventriculography. Catheter exchanges were performed over a guidewire. There were no immediate procedural complications.  The patient developed vessel closure in the lab and which was relieved with IC TNG.  IC Verapamil was then given.  The vessel opened up after that.  Dr. Excell Seltzer then came to the lab and we reviewed the films.   The patient was transferred to the post catheterization recovery area for further monitoring.  Procedural Findings: Hemodynamics:  AO 145/71 (103) LV 149/13 No gradient on pullback.     Coronary angiography: Coronary dominance: left  Left mainstem: Short, nearly separate ostia, and free of disease  Left anterior descending (LAD):  Courses to the apex.  Mild diffuse plaque is noted throughout the vessel.  Just after the septal is a 40% area of narrowing, and after the major diagonal also 40% narrowing.  There is a small first diagonal that is less than 1.31mm and has 70% narrowing and 50% narrowing.    Left circumflex (LCx): The vessel is a dominant artery. It provides a tiny OM1 that is insignificant.  The large OM2 bifurcates.  The superior branch is without major disease but is small.  The inferior branch is large and has segmental plaque of 40%, then an eccentric 80% ulcerative lesion.  More distally is a hypodense 75 % lesion then distally in the OM subbranch is a 50% area of disease.  This segmental disease extends probably over 40  mm or more.   The vessel occluded during the procedure then opened up with NTG easily.  The distal vessel is moderate in size.  There are then 3 PL branches and a PDA.  The 3 PL branches have mild luminal irregularities.  The moderate size PDA has 50% narrowing.    Right coronary artery (RCA): 40% mid narrowing in a non dominant artery.  A conus branch also has 60% narrowing.    Left ventriculography: Left ventricular systolic function is normal, LVEF is estimated at 55-65%, there is no significant mitral regurgitation   Final Conclusions:   1.  Normal LV function 2.  Long segmental disease with focal rupture in a sub-branch of the OM2 as noted above 3.  Other disease as noted.    Recommendations:  1.  Dr. Excell Seltzer and I reviewed this in detail.  To treat the OM he would likely need a 38mm stent to cover the area adequately, and we have concerns about reliability due to cocaine issues that are present and chronic.  He also has considerable spasm.  We will get him started on calcium channel blockers and nitrates, and add clopidogrel and check P2Y12.  He is uninsured and medication availability and compliance are of concern, especially with a 38 mm stent.  The risk of TVR would be high with BMS so DES is favored, but with his issues the likelihood of ST is of concern with medication compliance.  He said he was on BP meds while incarcerated, but stopped them as well.  We will start him on treatment and discuss  the options with the patient in detail.  He does not want his family involved in the discussions.    Shawnie Pons 06/02/2012, 9:59 AM

## 2012-06-02 NOTE — Progress Notes (Signed)
ANTICOAGULATION CONSULT NOTE - Follow Up Consult  Pharmacy Consult for heparin  Indication: chest pain/ACS  No Known Allergies  Patient Measurements: Height: 6' (182.9 cm) Weight: 225 lb 5 oz (102.2 kg) IBW/kg (Calculated) : 77.6  Heparin Dosing Weight: 100kg Vital Signs: Temp: 97.9 F (36.6 C) (11/14 0500) BP: 159/79 mmHg (11/14 1400) Pulse Rate: 81  (11/14 1400)  Labs:  Basename 06/02/12 0250 06/01/12 2059 06/01/12 2058 06/01/12 1619 06/01/12 1508 06/01/12 1157 05/31/12 0940  HGB 14.6 -- -- -- -- 15.5 --  HCT 43.5 -- -- -- -- 46.2 44.2  PLT 181 -- -- -- -- 202 197  APTT -- -- -- 90* -- -- --  LABPROT -- -- -- 13.9 -- -- --  INR -- -- -- 1.08 -- -- --  HEPARINUNFRC <0.10* <0.10* -- -- -- -- --  CREATININE 1.12 -- -- -- -- 1.02 1.00  CKTOTAL -- -- -- -- -- -- 112  CKMB -- -- -- -- -- -- 4.1*  TROPONINI <0.30 -- <0.30 -- <0.30 -- --    Estimated Creatinine Clearance: 91 ml/min (by C-G formula based on Cr of 1.12).  Medications:  Asa SL NTG  Assessment: 56 year old male seen at [facility] last night for chest pain/acs and left AMA. He returns to [facility] with continued chest pain concerning for ACS. Orders to initiate IV heparin. Of noted past medical history includes stroke. Heparin level this am was  (<0.1) on 1800 units/hr but at some point the line became unhooked so the level was not accurate.   Patient is now s/p cath with long segmental disease pending a possible very large stent. Checking p2y12 test in am to verify plavix responder.  Will restart at lower rate given post cath and new lower goal.  Goal of Therapy:  Heparin level 0.3-0.5 units/ml Monitor platelets by anticoagulation protocol: Yes   Plan:  1. Will restart IV heparin at 1600 units/hr. 2. Heparin level in 6 hours  Severiano Gilbert 06/02/2012,2:57 PM

## 2012-06-02 NOTE — Progress Notes (Signed)
  Echocardiogram 2D Echocardiogram has been performed.  Georgian Co 06/02/2012, 4:47 PM

## 2012-06-02 NOTE — Progress Notes (Addendum)
ANTICOAGULATION CONSULT NOTE - Follow Up Consult  Pharmacy Consult for heparin  Indication: chest pain/ACS  No Known Allergies  Patient Measurements: Height: 6' (182.9 cm) Weight: 235 lb (106.595 kg) IBW/kg (Calculated) : 77.6  Heparin Dosing Weight: 100kg Vital Signs: Temp: 98.6 F (37 C) (11/13 2100) BP: 128/68 mmHg (11/13 2100) Pulse Rate: 76  (11/13 2100)  Labs:  Basename 06/02/12 0250 06/01/12 2059 06/01/12 2058 06/01/12 1619 06/01/12 1508 06/01/12 1157 05/31/12 0940  HGB 14.6 -- -- -- -- 15.5 --  HCT 43.5 -- -- -- -- 46.2 44.2  PLT 181 -- -- -- -- 202 197  APTT -- -- -- 90* -- -- --  LABPROT -- -- -- 13.9 -- -- --  INR -- -- -- 1.08 -- -- --  HEPARINUNFRC <0.10* <0.10* -- -- -- -- --  CREATININE 1.12 -- -- -- -- 1.02 1.00  CKTOTAL -- -- -- -- -- -- 112  CKMB -- -- -- -- -- -- 4.1*  TROPONINI <0.30 -- <0.30 -- <0.30 -- --    Estimated Creatinine Clearance: 92.9 ml/min (by C-G formula based on Cr of 1.12).   Medical History: Past Medical History  Diagnosis Date  . Hypertension     Was on metoprolol, patient decided to stop taking it about two years ago  . TIA (transient ischemic attack) 1997    No residual neurological deficits.  . Pneumonia   . DM (diabetes mellitus) 2011    Was on metformin, but patient stopped taking it after 4 mo due to diarrhea  . Hepatitis late 1970s    Treated in the hospital, finished treatment course. No recurrence  since    Medications:  Asa SL NTG  Assessment: 56 year old male seen at [facility] last night for chest pain/acs and left AMA. He returns to [facility] with continued chest pain concerning for ACS. Orders to initiate IV heparin. Of noted past medical history includes stroke. Heparin level (<0.1) is below-goal on 1800 units/hr. Plan for cath in AM.  Goal of Therapy:  Heparin level 0.3-0.7 units/ml Monitor platelets by anticoagulation protocol: Yes   Plan:  1. Increase IV heparin to 2200 units/hr. 2. Heparin level in 6 hours  vs f/u post-cath.  Emeline Gins 06/02/2012,4:16 AM  Addendum: RN called to report that line / infusion had been disconnected at some point during the night and therefore heparin infusion rate of 1800 units/hr cannot be assessed using heparin level (<0.1). Will continue IV heparin at 1800 units/hr. Heparin level in 6 hours vs f/u post-cath.  Lorre Munroe, PharmD 06/02/12, 05:05 AM.

## 2012-06-03 DIAGNOSIS — F1410 Cocaine abuse, uncomplicated: Secondary | ICD-10-CM

## 2012-06-03 DIAGNOSIS — F17200 Nicotine dependence, unspecified, uncomplicated: Secondary | ICD-10-CM

## 2012-06-03 DIAGNOSIS — I209 Angina pectoris, unspecified: Secondary | ICD-10-CM

## 2012-06-03 LAB — CBC
MCH: 28.5 pg (ref 26.0–34.0)
MCHC: 33.7 g/dL (ref 30.0–36.0)
MCV: 84.8 fL (ref 78.0–100.0)
Platelets: 172 10*3/uL (ref 150–400)
RBC: 4.87 MIL/uL (ref 4.22–5.81)
RDW: 13.6 % (ref 11.5–15.5)

## 2012-06-03 LAB — COMPREHENSIVE METABOLIC PANEL
Alkaline Phosphatase: 81 U/L (ref 39–117)
BUN: 10 mg/dL (ref 6–23)
Creatinine, Ser: 1 mg/dL (ref 0.50–1.35)
GFR calc Af Amer: >90 mL/min (ref >90)
Glucose, Bld: 152 mg/dL — ABNORMAL HIGH (ref 70–99)
Potassium: 3.9 mEq/L (ref 3.5–5.1)
Total Protein: 6.4 g/dL (ref 6.0–8.3)

## 2012-06-03 MED ORDER — CLOPIDOGREL BISULFATE 75 MG PO TABS: 75.0000 mg | ORAL_TABLET | Freq: Every day | ORAL | Status: DC

## 2012-06-03 MED ORDER — NITROGLYCERIN 0.4 MG SL SUBL: 0.4000 mg | SUBLINGUAL_TABLET | SUBLINGUAL | Status: DC | PRN

## 2012-06-03 MED ORDER — ISOSORBIDE MONONITRATE ER 30 MG PO TB24: 30.0000 mg | ORAL_TABLET | Freq: Every day | ORAL | Status: DC

## 2012-06-03 MED ORDER — LISINOPRIL 2.5 MG PO TABS: 2.5000 mg | ORAL_TABLET | Freq: Every day | ORAL | Status: DC

## 2012-06-03 MED ORDER — ATORVASTATIN CALCIUM 20 MG PO TABS: 20.0000 mg | ORAL_TABLET | Freq: Every day | ORAL | Status: DC

## 2012-06-03 MED ORDER — ASPIRIN 81 MG PO TBEC: 81.0000 mg | DELAYED_RELEASE_TABLET | Freq: Every day | ORAL | Status: DC

## 2012-06-03 MED ORDER — SODIUM CHLORIDE 0.9 % IV SOLN: INTRAVENOUS | Status: DC

## 2012-06-03 MED ORDER — DILTIAZEM HCL 60 MG PO TABS: 60.0000 mg | ORAL_TABLET | Freq: Four times a day (QID) | ORAL | Status: DC

## 2012-06-03 NOTE — Progress Notes (Signed)
   Subjective:  Denies CP or dyspnea   Objective:  Filed Vitals:   06/02/12 2000 06/02/12 2334 06/03/12 0400 06/03/12 0500  BP: 140/67 147/79 124/64   Pulse: 77 87 69   Temp: 98.6 F (37 C) 98.3 F (36.8 C) 98 F (36.7 C)   TempSrc: Oral Oral Oral   Resp: 20 18 22    Height:      Weight:    226 lb 10.1 oz (102.8 kg)  SpO2: 92% 96% 96%     Intake/Output from previous day:  Intake/Output Summary (Last 24 hours) at 06/03/12 0742 Last data filed at 06/03/12 0700  Gross per 24 hour  Intake 2144.5 ml  Output      0 ml  Net 2144.5 ml    Physical Exam: Physical exam: Well-developed well-nourished in no acute distress.  Skin is warm and dry.  HEENT is normal.  Neck is supple.  Chest is clear to auscultation with normal expansion.  Cardiovascular exam is regular rate and rhythm. 1/6 systolic murmur Abdominal exam nontender or distended. No masses palpated. Right groin with no hematoma and no bruit Extremities show no edema. neuro grossly intact    Lab Results: Basic Metabolic Panel:  Basename 06/03/12 0610 06/02/12 0250  NA 132* 139  K 3.9 3.7  CL 101 104  CO2 26 27  GLUCOSE 152* 127*  BUN 10 13  CREATININE 1.00 1.12  CALCIUM 8.7 8.9  MG -- --  PHOS -- --   CBC:  Basename 06/03/12 0610 06/02/12 0250 06/01/12 1157  WBC 8.2 9.4 --  NEUTROABS -- -- 8.2*  HGB 13.9 14.6 --  HCT 41.3 43.5 --  MCV 84.8 85.1 --  PLT 172 181 --   Cardiac Enzymes:  Basename 06/02/12 0250 06/01/12 2058 06/01/12 1508 05/31/12 0940  CKTOTAL -- -- -- 112  CKMB -- -- -- 4.1*  CKMBINDEX -- -- -- --  TROPONINI <0.30 <0.30 <0.30 --     Assessment/Plan:  1 UA - cath results noted. Very difficult situation. Patient has a history of noncompliance and repeated cocaine use. I plan therefore is medical therapy. I would like for him to stay in the hospital after discontinuing his intravenous medications. We would continue medications by mouth and ambulate. We would recommend discharge  tomorrow morning with close followup if he remained pain-free. However the patient states he is unwilling to remain in the hospital. I explained the risk of myocardial infarction and death. I told him I would ask him to sign out AGAINST MEDICAL ADVICE and he still states he will not remain in the hospital. I am not optimistic about him being compliant with medications and medical followup. I stressed the need for compliance with medications and followup. He should see Dr Diona Browner in [place] 1-2 weeks after DC. 2 DM - management per primary care 3 Substance abuse - pt counseled on discontinuing. 4 Hypertension - add low dose ACEI particularly in light of DM; will avoid beta blocker with cocaine use.  Olga Millers 06/03/2012, 7:42 AM

## 2012-06-03 NOTE — Progress Notes (Addendum)
I spoke with Mr. Devonshire this morning around 820, discouraging him from leaving AMA even though he had already signed his AMA forms. I explained to him that if he stayed in the hospital tonight there was a possibility for him to receive assistance with his medications. He agreed to stay at least until earlier this afternoon. I contacted the Case Manager. Per the Case manager he could have assistance with Plavix and his other medications if he decided to stay. Per Dr. Jens Som, Mr. Kuzniar was aware that he needed to stay in the hospital overnight for continued therapy with possible discharge tomorrow. I returned to Mr. Carothers room around 910 to to inform him of the Case Manager's assistance and further discuss his need for continued in-hospital therapy but he had already left his room.  He left before being fully assessed by me or by Dr. Criselda Peaches, my attending.

## 2012-06-03 NOTE — Progress Notes (Signed)
Pt requesting to leave AMA. Dr. Youlanda Roys paged went over with the pt the risks of leaving and his the prognosis of his current medical condition. The pt states that he understands the consequences of his decision and would still like to leave AMA. Papers signed. Dr. Youlanda Roys sent scripts over to pts pharmacy. IVs removed and IV mediction stopped.

## 2012-06-04 NOTE — Discharge Summary (Signed)
Internal Medicine Teaching Program  Patient Left AMA   Name: [name] MRN: [id number] DOB: 12/06/1955 56 y.o.  Date of Admission: 06/01/2012 12:14 PM Date Patient Left AMA: 06/03/2012  Attending Physician: Dr. Criselda Peaches  Diagnosis During Hospital Stay: Principal Problem:  *Unstable angina Active Problems:  Hypertension  Diabetes mellitus type 2, diet-controlled  Tobacco abuse  H/O medication noncompliance  Cocaine abuse   Discharge Medications:   Medication List     As of 06/04/2012 12:11 PM    STOP taking these medications         aspirin 325 MG tablet      TAKE these medications         aspirin 81 MG EC tablet   Take 1 tablet (81 mg total) by mouth daily.      atorvastatin 20 MG tablet   Commonly known as: LIPITOR   Take 1 tablet (20 mg total) by mouth daily at 6 PM.      clopidogrel 75 MG tablet   Commonly known as: PLAVIX   Take 1 tablet (75 mg total) by mouth daily with breakfast.      diltiazem 60 MG tablet   Commonly known as: CARDIZEM   Take 1 tablet (60 mg total) by mouth every 6 (six) hours.      isosorbide mononitrate 30 MG 24 hr tablet   Commonly known as: IMDUR   Take 1 tablet (30 mg total) by mouth daily.      lisinopril 2.5 MG tablet   Commonly known as: PRINIVIL,ZESTRIL   Take 1 tablet (2.5 mg total) by mouth daily.      nitroGLYCERIN 0.4 MG SL tablet   Commonly known as: NITROSTAT   Place 1 tablet (0.4 mg total) under the tongue every 5 (five) minutes as needed for chest pain.        Disposition and follow-up:   [name] left AMA from [facility] in fair condition.He decided to leave AMA even after extensive discussion with Dr. Jens Som in regards to his need for continued in-hospital therapy. He left before a follow up appointment could be made.  If he follows up as outpatient will need reassessment of continued medical management of his CAD as he was found not to be a good candidate for stenting  secondary to high risk of ST and medication non compliance in addition to continued polysubstance abuse (cocaine). He was given prescription for Plavix, IMDUR, Lisinopril, Lipitor, SL nitroglycerin, and Diltiazem prior to his departure from the hospital.   Follow-up Appointments: None. The patient left AMA before any follow up appointments could be made.    Consultations: Treatment Team:  Rounding Lbcardiology, MD  Procedures Performed:  Dg Chest 2 View  06/01/2012  *RADIOLOGY REPORT*  Clinical Data: Chest pain  CHEST - 2 VIEW  Comparison:  May 31, 2012  Findings: The lungs clear.  The heart size and pulmonary vascularity are normal.  No adenopathy.  There is thoracic levoscoliosis.  IMPRESSION: No edema or consolidation.   Original Report Authenticated By: Bretta Bang, M.D.    Dg Chest 2 View  05/31/2012  *RADIOLOGY REPORT*  Clinical Data: Left sided chest pain and shortness of breath for 2 weeks  CHEST - 2 VIEW  Comparison: 03/11/2005  Findings: Heart size and vascular pattern are normal.  Lungs are clear.  Osseous thorax is intact.  IMPRESSION:  Negative   Original Report Authenticated By: Esperanza Heir, M.D.     2D Echo:  06/02/12:  Study Conclusions  - Left ventricle: The cavity size was normal. Wall thickness was increased in a pattern of mild LVH. Systolic function was vigorous. The estimated ejection fraction was in the range of 65% to 70%. - Left atrium: The atrium was mildly dilated.  Cardiac Cath:  06/02/12  Coronary angiography:  Coronary dominance: left  Left mainstem: Short, nearly separate ostia, and free of disease  Left anterior descending (LAD): Courses to the apex. Mild diffuse plaque is noted throughout the vessel. Just after the septal is a 40% area of narrowing, and after the major diagonal also 40% narrowing. There is a small first diagonal that is less than 1.37mm and has 70% narrowing and 50% narrowing.  Left circumflex (LCx): The vessel is a  dominant artery. It provides a tiny OM1 that is insignificant. The large OM2 bifurcates. The superior branch is without major disease but is small. The inferior branch is large and has segmental plaque of 40%, then an eccentric 80% ulcerative lesion. More distally is a hypodense 75 % lesion then distally in the OM subbranch is a 50% area of disease. This segmental disease extends probably over 40 mm or more. The vessel occluded during the procedure then opened up with NTG easily. The distal vessel is moderate in size. There are then 3 PL branches and a PDA. The 3 PL branches have mild luminal irregularities. The moderate size PDA has 50% narrowing.  Right coronary artery (RCA): 40% mid narrowing in a non dominant artery. A conus branch also has 60% narrowing.  Left ventriculography: Left ventricular systolic function is normal, LVEF is estimated at 55-65%, there is no significant mitral regurgitation  Final Conclusions:  1. Normal LV function  2. 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 Other disease as noted.  Recommendations:  1. Dr. Excell Seltzer and I reviewed this in detail. To treat the OM he would likely need a 38mm stent to cover the area adequately, and we have concerns about reliability due to cocaine issues that are present and chronic. He also has considerable spasm. We will get him started on calcium channel blockers and nitrates, and add clopidogrel and check P2Y12. He is uninsured and medication availability and compliance are of concern, especially with a 38 mm stent. The risk of TVR would be high with BMS so DES is favored, but with his issues the likelihood of ST is of concern with medication compliance. He said he was on BP meds while incarcerated, but stopped them as well. We will start him on treatment and discuss the options with the patient in detail. He does not want his family involved in the discussions   Admission HPI:  [name] is a 56  year old man with PMH significant for HTN, DM2, and TIA who comes in to the [facility] with history of three weeks of intermittent substernal chest pain. The pain started with exertion but now is present at rest, even waking him up at night. The chest pain episodes last for 5 minutes and resolved with rest. He has 7-8 episodes per day. The pain is described as chest pressure and pain in his left chest that radiates to his shoulder and arm, with occasional shortness of breath and dizziness. The day prior to his presentation he went to the ED at [facility] for increased chest pressure. He was given aspirin and nitroglycerin with resolution of his chest pain. He was also evaluated for admission but  he left before being admitted.  He denies ever experienced chest pain prior to 3 weeks ago. He has no history of CAD, echo, angiogram, or cardiac cath. He was incarcerated from 2007 to 2010 and he has not taken his BP medication after parole. He endorses mild swelling in bilateral hands and feet, worse later in the day for a while now. He was diagnosed with DM two years ago but stopped taking metformin after 4 months due to diarrhea. He has not been check his blood sugar at home. He smokes one pack of cigarettes daily and stated that a physician has hold him that he has hyperlipidemia. Per ED note, patient uses cocaine frequently but denied its association with CP. Patient has a brother who had an MI s/p stenting at age 35.  In the ED his EKG showed new T-wave inversion in lead II and aVF. Troponin has been negative x2. He will be admitted for further evaluation and treatment to include evaluation by Cardiology.   Hospital Course by problem list:  1. Unstable angina: He presented to the [facility] with unstable angina: intermittent substernal CP and SOB with exertion, recently worsened to acute onset even at rest. He has multiple risk factors such as reported HTN, DM, hyperlipidemia, smoking, and cocaine use. Cocaine vasospasm  also a possibility with at least worsening of unstable angina secondary to vasospasms. He does deny using cocaine 3 weeks ago, however, when the chest pain first started. His UDS is positive for cocaine. He was started on heparin drip, given morphine PRN, ASA, O2, and nitroglycerin SL. His repeat EKG had no new ST changes. Cardiology was consulted promptly with Cardiac cath shortly after his admission, and timely discontinuation of his heparin drip.  His cardiac cath showed LAD with OM2 branch with ulcerative lesion and CAD that would likely require a 38mm stent to cover the area adequately. However, he did not have a stent placed. After extensive discussion with Dr. Riley Kill, in Interventional Cardiology, Mr. Fitzsimmons was found not to be a good candidate for coronary stent secondary to poor prognosis for medication compliance due to financial hardship and his high risk of stent thrombosis. He will have medical management at this time with diltiazem, nitrates, and clopidogrel, and low dose ACEi. His Troponin was negative x4, with no chest pain reported after his cardiac cath. For risk stratification: TSH 1.020 (normal), HgBA1C 6.3 (pre-diabetes), lipid panel with LDL of 99 (goal is <70). He was strongly encouraged to remain in the hospital for at least one more day and night while nitroglycerin was stopped but he refused to stay. Dr. Jens Som, Dr. Clyde Lundborg, and myself met with Mr. Schappert and explained the risks of him leaving AMA, including the risk of sudden death from acute MI. The Case Manager was briefly involved to help him with medication samples but he left AMA before we could further help him. He was given prescription for his medications prior to his departure and he was strongly encouraged to contact [facility] for outpatient follow up.   2. HTN: He reported history of HTN, used to be on metoprolol. He stopped taking it about two years ago after being encarcerated. His BP ranged from 136~170/81~92.  Given his history of cocaine, we discontinued metoprolol. Per Cardiology he is now on Lisinopril, Imdur, and diltiazem and was given this prescriptions prior to leaving AMA.    3. DM2: He reported being diagnosed with DM2  in 2011. He was taking metformin for 4 months and stopped due  to diarrhea and never followed up with a PCP. His Hgb A1C during this hospitalization was  6.4% (pre-diabetes). He was placed on a on SSI (sensitive), with CBGs below 200, no insulin correction given. Again he needs outpatient follow up but he left AMA before any follow up arrangements could be made.    Discharge Vitals:  BP 124/64  Pulse 69  Temp 98 F (36.7 C) (Oral)  Resp 22  Ht 6' (1.829 m)  Wt 226 lb 10.1 oz (102.8 kg)  BMI 30.74 kg/m2  SpO2 96%  Discharge Labs:  Lab Results:  Basic Metabolic Panel:   [facility]  06/03/12 0610  06/02/12 0250   NA  132*  139   K  3.9  3.7   CL  101  104   CO2  26  27   GLUCOSE  152*  127*   BUN  10  13   CREATININE  1.00  1.12   CALCIUM  8.7  8.9   MG  --  --   PHOS  --  --    CBC:   Basename  06/03/12 0610  06/02/12 0250  06/01/12 1157   WBC  8.2  9.4  --   NEUTROABS  --  --  8.2*   HGB  13.9  14.6  --   HCT  41.3  43.5  --   MCV  84.8  85.1  --   PLT  172  181  --    Cardiac Enzymes:   Basename  06/02/12 0250  06/01/12 2058  06/01/12 1508  05/31/12 0940   CKTOTAL  --  --  --  112   CKMB  --  --  --  4.1*   CKMBINDEX  --  --  --  --   TROPONINI  <0.30  <0.30  <0.30  --      Signed: Sara Chu D 06/04/2012, 12:11 PM   Time Spent on Discharge: 40 minutes Services Ordered on Discharge: None Equipment Ordered on Discharge: None

## 2012-06-06 NOTE — Discharge Summary (Signed)
I did not see [name] on day of discharge, because he left AMA.  He was seen the evening prior.

## 2012-11-03 ENCOUNTER — Inpatient Hospital Stay (HOSPITAL_BASED_OUTPATIENT_CLINIC_OR_DEPARTMENT_OTHER)
Admission: EM | Admit: 2012-11-03 | Discharge: 2012-11-05 | DRG: 303 | Disposition: A | Discharge status: Home / Self Care | Payer: Self-pay | Attending: Internal Medicine | Admitting: Internal Medicine

## 2012-11-03 ENCOUNTER — Encounter (HOSPITAL_BASED_OUTPATIENT_CLINIC_OR_DEPARTMENT_OTHER): Payer: Self-pay | Admitting: Family Medicine

## 2012-11-03 ENCOUNTER — Emergency Department (HOSPITAL_BASED_OUTPATIENT_CLINIC_OR_DEPARTMENT_OTHER): Payer: Self-pay

## 2012-11-03 DIAGNOSIS — I200 Unstable angina: Secondary | ICD-10-CM

## 2012-11-03 DIAGNOSIS — Z7902 Long term (current) use of antithrombotics/antiplatelets: Secondary | ICD-10-CM

## 2012-11-03 DIAGNOSIS — E1165 Type 2 diabetes mellitus with hyperglycemia: Secondary | ICD-10-CM

## 2012-11-03 DIAGNOSIS — F17200 Nicotine dependence, unspecified, uncomplicated: Secondary | ICD-10-CM | POA: Diagnosis present

## 2012-11-03 DIAGNOSIS — E785 Hyperlipidemia, unspecified: Secondary | ICD-10-CM | POA: Diagnosis present

## 2012-11-03 DIAGNOSIS — Z91199 Patient's noncompliance with other medical treatment and regimen due to unspecified reason: Secondary | ICD-10-CM

## 2012-11-03 DIAGNOSIS — N189 Chronic kidney disease, unspecified: Secondary | ICD-10-CM | POA: Diagnosis present

## 2012-11-03 DIAGNOSIS — Z9114 Patient's other noncompliance with medication regimen: Secondary | ICD-10-CM

## 2012-11-03 DIAGNOSIS — I10 Essential (primary) hypertension: Secondary | ICD-10-CM | POA: Diagnosis present

## 2012-11-03 DIAGNOSIS — E118 Type 2 diabetes mellitus with unspecified complications: Secondary | ICD-10-CM | POA: Diagnosis present

## 2012-11-03 DIAGNOSIS — E1169 Type 2 diabetes mellitus with other specified complication: Secondary | ICD-10-CM | POA: Diagnosis present

## 2012-11-03 DIAGNOSIS — F3289 Other specified depressive episodes: Secondary | ICD-10-CM | POA: Diagnosis present

## 2012-11-03 DIAGNOSIS — E7889 Other lipoprotein metabolism disorders: Secondary | ICD-10-CM | POA: Diagnosis present

## 2012-11-03 DIAGNOSIS — Z720 Tobacco use: Secondary | ICD-10-CM | POA: Diagnosis present

## 2012-11-03 DIAGNOSIS — F1410 Cocaine abuse, uncomplicated: Secondary | ICD-10-CM | POA: Diagnosis present

## 2012-11-03 DIAGNOSIS — E789 Disorder of lipoprotein metabolism, unspecified: Secondary | ICD-10-CM | POA: Diagnosis present

## 2012-11-03 DIAGNOSIS — Z9119 Patient's noncompliance with other medical treatment and regimen: Secondary | ICD-10-CM

## 2012-11-03 DIAGNOSIS — Z23 Encounter for immunization: Secondary | ICD-10-CM

## 2012-11-03 DIAGNOSIS — F329 Major depressive disorder, single episode, unspecified: Secondary | ICD-10-CM | POA: Diagnosis present

## 2012-11-03 DIAGNOSIS — IMO0002 Reserved for concepts with insufficient information to code with codable children: Secondary | ICD-10-CM | POA: Diagnosis present

## 2012-11-03 DIAGNOSIS — I129 Hypertensive chronic kidney disease with stage 1 through stage 4 chronic kidney disease, or unspecified chronic kidney disease: Secondary | ICD-10-CM | POA: Diagnosis present

## 2012-11-03 DIAGNOSIS — I2510 Atherosclerotic heart disease of native coronary artery without angina pectoris: Principal | ICD-10-CM | POA: Diagnosis present

## 2012-11-03 DIAGNOSIS — Z8673 Personal history of transient ischemic attack (TIA), and cerebral infarction without residual deficits: Secondary | ICD-10-CM

## 2012-11-03 DIAGNOSIS — Z91148 Patient's other noncompliance with medication regimen for other reason: Secondary | ICD-10-CM

## 2012-11-03 DIAGNOSIS — E119 Type 2 diabetes mellitus without complications: Secondary | ICD-10-CM

## 2012-11-03 DIAGNOSIS — Z7982 Long term (current) use of aspirin: Secondary | ICD-10-CM

## 2012-11-03 DIAGNOSIS — F411 Generalized anxiety disorder: Secondary | ICD-10-CM | POA: Diagnosis present

## 2012-11-03 DIAGNOSIS — Z79899 Other long term (current) drug therapy: Secondary | ICD-10-CM

## 2012-11-03 LAB — CBC WITH DIFFERENTIAL/PLATELET
Basophils Absolute: 0 10*3/uL (ref 0.0–0.1)
HCT: 46.1 % (ref 39.0–52.0)
Lymphocytes Relative: 21 % (ref 12–46)
Monocytes Absolute: 0.5 10*3/uL (ref 0.1–1.0)
Neutro Abs: 6.6 10*3/uL (ref 1.7–7.7)
Platelets: 215 10*3/uL (ref 150–400)
RBC: 5.47 MIL/uL (ref 4.22–5.81)
RDW: 13.6 % (ref 11.5–15.5)
WBC: 9.1 10*3/uL (ref 4.0–10.5)

## 2012-11-03 LAB — RAPID URINE DRUG SCREEN, HOSP PERFORMED
Amphetamines: NOT DETECTED
Benzodiazepines: NOT DETECTED
Cocaine: POSITIVE — AB
Opiates: NOT DETECTED
Tetrahydrocannabinol: NOT DETECTED

## 2012-11-03 LAB — BASIC METABOLIC PANEL
CO2: 25 mEq/L (ref 19–32)
Chloride: 101 mEq/L (ref 96–112)
Sodium: 138 mEq/L (ref 135–145)

## 2012-11-03 LAB — CK TOTAL AND CKMB (NOT AT ARMC)
CK, MB: 2.2 ng/mL (ref 0.3–4.0)
CK, MB: 2.1 ng/mL (ref 0.3–4.0)
Relative Index: INVALID (ref 0.0–2.5)

## 2012-11-03 LAB — HEPARIN LEVEL (UNFRACTIONATED): Heparin Unfractionated: <0.1 IU/mL — ABNORMAL LOW (ref 0.30–0.70)

## 2012-11-03 LAB — TROPONIN I
Troponin I: <0.3 ng/mL (ref <0.30)
Troponin I: <0.3 ng/mL (ref <0.30)

## 2012-11-03 LAB — GLUCOSE, CAPILLARY: Glucose-Capillary: 127 mg/dL — ABNORMAL HIGH (ref 70–99)

## 2012-11-03 MED ORDER — NITROGLYCERIN IN D5W 200-5 MCG/ML-% IV SOLN
2.0000 ug/min | Freq: Once | INTRAVENOUS | Status: AC
  Administered 2012-11-03: 5 ug/min via INTRAVENOUS
  Filled 2012-11-03: qty 250

## 2012-11-03 MED ORDER — ACETAMINOPHEN 650 MG RE SUPP: 650.0000 mg | Freq: Four times a day (QID) | RECTAL | Status: DC | PRN

## 2012-11-03 MED ORDER — SODIUM CHLORIDE 0.9 % IJ SOLN
3.0000 mL | Freq: Two times a day (BID) | INTRAMUSCULAR | Status: DC
  Administered 2012-11-04 – 2012-11-05 (×3): 3 mL via INTRAVENOUS

## 2012-11-03 MED ORDER — PANTOPRAZOLE SODIUM 40 MG PO TBEC
40.0000 mg | DELAYED_RELEASE_TABLET | Freq: Every day | ORAL | Status: DC
  Administered 2012-11-03 – 2012-11-04 (×2): 40 mg via ORAL
  Filled 2012-11-03 (×2): qty 1

## 2012-11-03 MED ORDER — NICOTINE 21 MG/24HR TD PT24
21.0000 mg | MEDICATED_PATCH | Freq: Every day | TRANSDERMAL | Status: DC
  Administered 2012-11-03 – 2012-11-04 (×2): 21 mg via TRANSDERMAL
  Filled 2012-11-03 (×3): qty 1

## 2012-11-03 MED ORDER — MORPHINE SULFATE 2 MG/ML IJ SOLN
2.0000 mg | INTRAMUSCULAR | Status: DC | PRN
  Administered 2012-11-03 – 2012-11-04 (×4): 2 mg via INTRAVENOUS
  Filled 2012-11-03 (×5): qty 1

## 2012-11-03 MED ORDER — HEPARIN BOLUS VIA INFUSION
3000.0000 [IU] | Freq: Once | INTRAVENOUS | Status: AC
  Administered 2012-11-03: 3000 [IU] via INTRAVENOUS
  Filled 2012-11-03: qty 3000

## 2012-11-03 MED ORDER — HEPARIN BOLUS VIA INFUSION
4000.0000 [IU] | Freq: Once | INTRAVENOUS | Status: AC
  Administered 2012-11-03: 4000 [IU] via INTRAVENOUS
  Filled 2012-11-03: qty 4000

## 2012-11-03 MED ORDER — ASPIRIN 81 MG PO CHEW
324.0000 mg | CHEWABLE_TABLET | Freq: Once | ORAL | Status: AC
  Administered 2012-11-03: 324 mg via ORAL
  Filled 2012-11-03: qty 4

## 2012-11-03 MED ORDER — ONDANSETRON HCL 4 MG PO TABS: 4.0000 mg | ORAL_TABLET | Freq: Four times a day (QID) | ORAL | Status: DC | PRN

## 2012-11-03 MED ORDER — ONDANSETRON HCL 4 MG/2ML IJ SOLN
4.0000 mg | Freq: Four times a day (QID) | INTRAMUSCULAR | Status: DC | PRN
  Administered 2012-11-04: 4 mg via INTRAVENOUS
  Filled 2012-11-03: qty 2

## 2012-11-03 MED ORDER — PNEUMOCOCCAL VAC POLYVALENT 25 MCG/0.5ML IJ INJ
0.5000 mL | INJECTION | INTRAMUSCULAR | Status: AC
  Administered 2012-11-04: 0.5 mL via INTRAMUSCULAR
  Filled 2012-11-03: qty 0.5

## 2012-11-03 MED ORDER — CLOPIDOGREL BISULFATE 75 MG PO TABS
75.0000 mg | ORAL_TABLET | Freq: Every day | ORAL | Status: DC
  Administered 2012-11-04 – 2012-11-05 (×2): 75 mg via ORAL
  Filled 2012-11-03 (×3): qty 1

## 2012-11-03 MED ORDER — ATORVASTATIN CALCIUM 20 MG PO TABS
20.0000 mg | ORAL_TABLET | Freq: Every day | ORAL | Status: DC
  Administered 2012-11-03 – 2012-11-04 (×2): 20 mg via ORAL
  Filled 2012-11-03 (×3): qty 1

## 2012-11-03 MED ORDER — ALBUTEROL SULFATE (5 MG/ML) 0.5% IN NEBU: 2.5000 mg | INHALATION_SOLUTION | RESPIRATORY_TRACT | Status: DC | PRN

## 2012-11-03 MED ORDER — HEPARIN (PORCINE) IN NACL 100-0.45 UNIT/ML-% IJ SOLN
2000.0000 [IU]/h | INTRAMUSCULAR | Status: DC
  Administered 2012-11-03: 1500 [IU]/h via INTRAVENOUS
  Administered 2012-11-03 – 2012-11-04 (×2): 1900 [IU]/h via INTRAVENOUS
  Filled 2012-11-03 (×4): qty 250

## 2012-11-03 MED ORDER — ACETAMINOPHEN 325 MG PO TABS: 650.0000 mg | ORAL_TABLET | Freq: Four times a day (QID) | ORAL | Status: DC | PRN

## 2012-11-03 MED ORDER — NITROGLYCERIN IN D5W 200-5 MCG/ML-% IV SOLN
2.0000 ug/min | INTRAVENOUS | Status: AC
  Administered 2012-11-03: 15 ug/min via INTRAVENOUS

## 2012-11-03 MED ORDER — SODIUM CHLORIDE 0.9 % IV SOLN
INTRAVENOUS | Status: DC
  Administered 2012-11-03: 75 mL/h via INTRAVENOUS
  Administered 2012-11-04: 03:00:00 via INTRAVENOUS

## 2012-11-03 NOTE — Progress Notes (Signed)
ANTICOAGULATION CONSULT NOTE - Initial Consult  Pharmacy Consult for Heparin Indication: chest pain/ACS  No Known Allergies  Patient Measurements: Height: 6' (182.9 cm) Weight: 232 lb 5.8 oz (105.4 kg) IBW/kg (Calculated) : 77.6 Heparin Dosing Weight:  100 kg  Vital Signs: Temp: 97.6 F (36.4 C) (04/17 1608) Temp src: Oral (04/17 1608) BP: 146/75 mmHg (04/17 1608) Pulse Rate: 63 (04/17 1608)  Labs:  Recent Labs  11/03/12 1210  HGB 15.9  HCT 46.1  PLT 215  CREATININE 1.40*  TROPONINI <0.30    Estimated Creatinine Clearance: 73 ml/min (by C-G formula based on Cr of 1.4).   Medical History: Past Medical History  Diagnosis Date  . Hypertension     Was on metoprolol, patient decided to stop taking it about two years ago  . TIA (transient ischemic attack) 1997    No residual neurological deficits.  . Pneumonia   . DM (diabetes mellitus) 2011    Was on metformin, but patient stopped taking it after 4 mo due to diarrhea  . Hepatitis late 1970s    Treated in the hospital, finished treatment course. No recurrence  since    Medications:  Prescriptions prior to admission  Medication Sig Dispense Refill  . atorvastatin (LIPITOR) 20 MG tablet Take 1 tablet (20 mg total) by mouth daily at 6 PM.  30 tablet  5  . clopidogrel (PLAVIX) 75 MG tablet Take 1 tablet (75 mg total) by mouth daily with breakfast.  30 tablet  5  . diltiazem (CARDIZEM) 60 MG tablet Take 1 tablet (60 mg total) by mouth every 6 (six) hours.  120 tablet  2  . isosorbide mononitrate (IMDUR) 30 MG 24 hr tablet Take 1 tablet (30 mg total) by mouth daily.  30 tablet  3  . lisinopril (PRINIVIL,ZESTRIL) 2.5 MG tablet Take 1 tablet (2.5 mg total) by mouth daily.  30 tablet  5  . nitroGLYCERIN (NITROSTAT) 0.4 MG SL tablet Place 1 tablet (0.4 mg total) under the tongue every 5 (five) minutes as needed for chest pain.  30 tablet  1    Assessment: CP 57 y/o male with known h/o CAD (OM2), cocaine abuse, DM, and  noncompliance presents with CP that has been worsening since 11/13 (not candidate for stent placement then). Cardiology consulted.  Baseline CBC WNL. Scr elevated at 1.4.  Goal of Therapy:  Heparin level 0.3-0.7 units/ml Monitor platelets by anticoagulation protocol: Yes   Plan:  Heparin 4000 unit IV bolus followed by infusion of 1500 units/hr Check heparin level 6-8 hrs after heparin starts and daily.  Misty Stanley Stillinger 11/03/2012,4:24 PM  Jovante Hammitt S. Merilynn Finland, PharmD, BCPS Clinical Staff Pharmacist Pager [phone]

## 2012-11-03 NOTE — Progress Notes (Signed)
ANTICOAGULATION CONSULT NOTE - Follow Up Consult  Pharmacy Consult for Heparin Indication: chest pain/ACS  No Known Allergies  Patient Measurements: Height: 6' (182.9 cm) Weight: 232 lb 5.8 oz (105.4 kg) IBW/kg (Calculated) : 77.6 Heparin Dosing Weight:  100 kg  Vital Signs: Temp: 98.7 F (37.1 C) (04/17 2025) Temp src: Oral (04/17 2025) BP: 139/77 mmHg (04/17 2025) Pulse Rate: 88 (04/17 2025)  Labs:  Recent Labs  11/03/12 1210 11/03/12 1633 11/03/12 2202 11/03/12 2206  HGB 15.9  --   --   --   HCT 46.1  --   --   --   PLT 215  --   --   --   HEPARINUNFRC  --   --  <0.10*  --   CREATININE 1.40*  --   --   --   CKTOTAL  --  85  --  71  CKMB  --  2.2  --  2.1  TROPONINI <0.30 <0.30  --  <0.30    Estimated Creatinine Clearance: 73 ml/min (by C-G formula based on Cr of 1.4).   Medical History: Past Medical History  Diagnosis Date  . Hypertension     Was on metoprolol, patient decided to stop taking it about two years ago  . TIA (transient ischemic attack) 1997    No residual neurological deficits.  . Pneumonia   . DM (diabetes mellitus) 2011    Was on metformin, but patient stopped taking it after 4 mo due to diarrhea  . Hepatitis late 1970s    Treated in the hospital, finished treatment course. No recurrence  since    Medications:  Prescriptions prior to admission  Medication Sig Dispense Refill  . atorvastatin (LIPITOR) 20 MG tablet Take 1 tablet (20 mg total) by mouth daily at 6 PM.  30 tablet  5  . clopidogrel (PLAVIX) 75 MG tablet Take 1 tablet (75 mg total) by mouth daily with breakfast.  30 tablet  5  . diltiazem (CARDIZEM) 60 MG tablet Take 1 tablet (60 mg total) by mouth every 6 (six) hours.  120 tablet  2  . isosorbide mononitrate (IMDUR) 30 MG 24 hr tablet Take 1 tablet (30 mg total) by mouth daily.  30 tablet  3  . lisinopril (PRINIVIL,ZESTRIL) 2.5 MG tablet Take 1 tablet (2.5 mg total) by mouth daily.  30 tablet  5  . nitroGLYCERIN (NITROSTAT)  0.4 MG SL tablet Place 1 tablet (0.4 mg total) under the tongue every 5 (five) minutes as needed for chest pain.  30 tablet  1    Assessment: CP 57 y/o male with known h/o CAD (OM2), cocaine abuse, DM, and noncompliance presents with CP that has been worsening since 11/13 (not candidate for stent placement then). Cardiology consulted.  Heparin level (< 0.1) is below-goal on 1500 units/hr. No problem with line / infusion and no bleeding per RN.   Goal of Therapy:  Heparin level 0.3-0.7 units/ml Monitor platelets by anticoagulation protocol: Yes   Plan:  1. Heparin IV 3000 units bolus, then increase IV infusion to 1900 units/hr.  2. Heparin level in 6 hours.   Emeline Gins 11/03/2012,11:32 PM

## 2012-11-03 NOTE — ED Notes (Addendum)
Pt c/o central chest pain intermittent since November, constant since 3am. Pt had cath in November. Pt sts he took 3 NTG at 10am and 3 at onset at 3am.

## 2012-11-03 NOTE — Consult Note (Signed)
CARDIOLOGY CONSULT NOTE   Patient ID: [name] MRN: [id number] DOB/AGE: 1955-07-22 57 y.o.  Admit date: 11/03/2012  Primary Physician   No primary provider on file. Primary Cardiologist  SM saw in consult 11/13, pt did not f/u Reason for Consultation   Chest pain  WUJ:WJXBJ Lacretia Nicks Arriaga is a 57 y.o. male with a history of CAD. He came in last November and a cath showed CAD. Enzymes were negative. Plan was for stabilization and consider PCI but pt left AMA and has not followed up since then. He has had chest pain frequently, recently nearly every day. He has had pain with and without exertion, his usual angina. He uses SL NTG for relief, with success. Today, he was wakened by [facility]SCP at 3 am. He took 3 SL NTG with relief but the pain returned later this am. He took more NTG but came to the ER. In the ER, he was admitted, started on IV NTG and heparin, BP meds and is currently pain-free. He tested positive for cocaine but says has not done the drug since Sunday. He says he has been compliant with Plavix. Currently, he is resting comfortably.      Past Medical History  Diagnosis Date  . Hypertension     Was on metoprolol, patient decided to stop taking it about two years ago  . TIA (transient ischemic attack) 1997    No residual neurological deficits.  . Pneumonia   . DM (diabetes mellitus) 2011    Was on metformin, but patient stopped taking it after 4 mo due to diarrhea  . Hepatitis late 1970s    Treated in the hospital, finished treatment course. No recurrence  since     Past Surgical History  Procedure Laterality Date  . Appendectomy      No Known Allergies  I have reviewed the patient's current medications . atorvastatin  20 mg Oral q1800  . [START ON 11/04/2012] clopidogrel  75 mg Oral Q breakfast  . nicotine  21 mg Transdermal Daily  . pantoprazole  40 mg Oral Daily  . [START ON 11/04/2012] pneumococcal 23 valent vaccine  0.5 mL Intramuscular Tomorrow-1000  . sodium  chloride  3 mL Intravenous Q12H   . sodium chloride 75 mL/hr (11/03/12 1600)  . heparin 1,500 Units/hr (11/03/12 1729)  . nitroGLYCERIN 25 mcg/min (11/03/12 1715)   acetaminophen, acetaminophen, albuterol, morphine injection, ondansetron (ZOFRAN) IV, ondansetron  Prior to Admission medications   Medication Sig Start Date End Date Taking? Authorizing Provider  atorvastatin (LIPITOR) 20 MG tablet Take 1 tablet (20 mg total) by mouth daily at 6 PM. 06/03/12  Yes Lorretta Harp, MD  clopidogrel (PLAVIX) 75 MG tablet Take 1 tablet (75 mg total) by mouth daily with breakfast. 06/03/12  Yes Lorretta Harp, MD  diltiazem (CARDIZEM) 60 MG tablet Take 1 tablet (60 mg total) by mouth every 6 (six) hours. 06/03/12  Yes Lorretta Harp, MD  isosorbide mononitrate (IMDUR) 30 MG 24 hr tablet Take 1 tablet (30 mg total) by mouth daily. 06/03/12  Yes Lorretta Harp, MD  lisinopril (PRINIVIL,ZESTRIL) 2.5 MG tablet Take 1 tablet (2.5 mg total) by mouth daily. 06/03/12  Yes Lorretta Harp, MD  nitroGLYCERIN (NITROSTAT) 0.4 MG SL tablet Place 1 tablet (0.4 mg total) under the tongue every 5 (five) minutes as needed for chest pain. 06/03/12  Yes Lorretta Harp, MD     History   Social History  . Marital Status: Single    Spouse Name: N/A  Number of Children: N/A  . Years of Education: N/A   Occupational History  . Not  employed   Social History Main Topics  . Smoking status: Current Every Day Smoker    Types: Cigarettes  . Smokeless tobacco: Not on file     Comment: Patient does not think it is a problem for him at this time.  . Alcohol Use: No  . Drug Use: Yes    Special: Cocaine  . Sexually Active: No   Other Topics Concern  . Not on file   Social History Narrative  . No information is to be given to family members.    Family Status  Relation Status Death Age  . Mother Alive    Family History  Problem Relation Age of Onset  . Hypertension    . Diabetes    . Stroke    . Cancer Father     Lung  . Cancer Mother      Thyroid  . Cancer Maternal Grandmother     Breast  . Cancer Maternal Grandfather     Throat and stomach     ROS:  Full 14 point review of systems complete and found to be negative unless listed above.  Physical Exam: Blood pressure 123/48, pulse 72, temperature 97.6 F (36.4 C), temperature source Oral, resp. rate 16, height 6' (1.829 m), weight 232 lb 5.8 oz (105.4 kg), SpO2 95.00%.  General: Well developed, well nourished, male in no acute distress Head: Eyes PERRLA, No xanthomas.   Normocephalic and atraumatic, oropharynx without edema or exudate. Dentition:  Lungs:  Heart: HRRR S1 S2, no rub/gallop, Heart irregular rate and rhythm with S1, S2  murmur. pulses are 2+ extrem.   Neck: No carotid bruits. No lymphadenopathy.  JVD. Abdomen: Bowel sounds present, abdomen soft and non-tender without masses or hernias noted. Msk:  No spine or cva tenderness. No weakness, no joint deformities or effusions. Extremities: No clubbing or cyanosis.  edema.  Neuro: Alert and oriented X 3. No focal deficits noted. Psych:  Good affect, responds appropriately Skin: No rashes or lesions noted.  Labs:   Lab Results  Component Value Date   WBC 9.1 11/03/2012   HGB 15.9 11/03/2012   HCT 46.1 11/03/2012   MCV 84.3 11/03/2012   PLT 215 11/03/2012     Recent Labs Lab 11/03/12 1210  NA 138  K 4.0  CL 101  CO2 25  BUN 14  CREATININE 1.40*  CALCIUM 9.6  GLUCOSE 165*    Recent Labs  11/03/12 1210 11/03/12 1633  CKTOTAL  --  85  CKMB  --  2.2  TROPONINI <0.30 <0.30   Drugs of Abuse     Component Value Date/Time   LABOPIA NONE DETECTED 11/03/2012 1220   COCAINSCRNUR POSITIVE* 11/03/2012 1220   LABBENZ NONE DETECTED 11/03/2012 1220   AMPHETMU NONE DETECTED 11/03/2012 1220   THCU NONE DETECTED 11/03/2012 1220   LABBARB NONE DETECTED 11/03/2012 1220    Cardiac Cath:  05/2012 Left mainstem: Short, nearly separate ostia, and free of disease  Left anterior descending (LAD): Courses to the  apex. Mild diffuse plaque is noted throughout the vessel. Just after the septal is a 40% area of narrowing, and after the major diagonal also 40% narrowing. There is a small first diagonal that is less than 1.69mm and has 70% narrowing and 50% narrowing.  Left circumflex (LCx): The vessel is a dominant artery. It provides a tiny OM1 that is insignificant. The large OM2 bifurcates.  The superior branch is without major disease but is small. The inferior branch is large and has segmental plaque of 40%, then an eccentric 80% ulcerative lesion. More distally is a hypodense 75 % lesion then distally in the OM subbranch is a 50% area of disease. This segmental disease extends probably over 40 mm or more. The vessel occluded during the procedure then opened up with NTG easily. The distal vessel is moderate in size. There are then 3 PL branches and a PDA. The 3 PL branches have mild luminal irregularities. The moderate size PDA has 50% narrowing.  Right coronary artery (RCA): 40% mid narrowing in a non dominant artery. A conus branch also has 60% narrowing.  Left ventriculography: Left ventricular systolic function is normal, LVEF is estimated at 55-65%, there is no significant mitral regurgitation  Final Conclusions:  1. Normal LV function  2. 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 Other disease as noted.  Recommendations:  1. Dr. Excell Seltzer and I reviewed this in detail. To treat the OM he would likely need a 38mm stent to cover the area adequately, and we have concerns about reliability due to cocaine issues that are present and chronic. He also has considerable spasm. We will get him started on calcium channel blockers and nitrates, and add clopidogrel and check P2Y12. He is uninsured and medication availability and compliance are of concern, especially with a 38 mm stent. The risk of TVR would be high with BMS so DES is favored, but with his issues the likelihood of  ST is of concern with medication compliance. He said he was on BP meds while incarcerated, but stopped them as well. We will start him on treatment and discuss the options with the patient in detail. He does not want his family involved in the discussions.  (pt later left AMA)  Echo: 06/02/2012 Conclusions - Left ventricle: The cavity size was normal. Wall thickness was increased in a pattern of mild LVH. Systolic function was vigorous. The estimated ejection fraction was in the range of 65% to 70%. - Left atrium: The atrium was mildly dilated.  ECG:   03-Nov-2012 12:07:43  Normal sinus rhythm Normal ECG Since previous tracing axis has normalized 53mm/s 81mm/mV 150Hz  8.0.1 12SL 241 CID: 0 Referred by: YAO Confirmed By: Jerelyn Scott MD Vent. rate 77 BPM PR interval 172 ms QRS duration 88 ms QT/QTc 344/389 ms P-R-T axes 54 0 51  Radiology:  Dg Chest 2 View  11/03/2012  *RADIOLOGY REPORT*  Clinical Data: Intermittent chest pain for 5 months, more constant today.  CHEST - 2 VIEW  Comparison: 06/01/2012 and 05/31/2012.  Findings: The heart size and mediastinal contours are stable. There is new central interstitial prominence which may reflect central airway thickening.  There is no edema, airspace disease or significant pleural effusion.  IMPRESSION: Suspected mild central airway thickening suggesting bronchitis.  No evidence of pneumonia or edema.   Original Report Authenticated By: Carey Bullocks, M.D.     ASSESSMENT AND PLAN:   The patient was seen today by Dr Patty Sermons, the patient evaluated and the data reviewed.  Principal Problem:   Unstable angina - he is currently pain-free on nitrates and heparin. He admits to recent drug use. There is concern for compliance with meds/abstinence from drugs/tobacco. Currently, his EF is preserved and enzymes are negative. If he is stented and has stent closure, he will be negatively affected. Therefore, the best option is to  maximize medical  therapy, follow him as an outpatient, and if he continues to have symptoms when he is compliant with a treatment regimen, consider PCI at that time.   Otherwise, per primary MD. Active Problems:   Hypertension   Diabetes mellitus type 2, diet-controlled   Tobacco abuse   H/O medication noncompliance   Cocaine abuse   Signed: Theodore Demark, PA-C 11/03/2012 7:32 PM Beeper [phone] Seen in CCU with Theodore Demark PA-C.  His ongoing problems with cocaine addiction and resultant noncompliance with medications make aggressive therapy of his known CAD more problematic.  Fortunately his EKG during pain is still normal, and his troponin is normal.  We will continue to pursue medical therapy of his CAD.  No cath this admission unless clearcut enzyme rise or EKG changes.  He will need close outpatient followup to assure compliance. Co-Sign MD

## 2012-11-03 NOTE — Progress Notes (Signed)
Pt states that he wishes for no family or other friends/family to know about drug problem;

## 2012-11-03 NOTE — ED Provider Notes (Signed)
Medical screening examination/treatment/procedure(s) were performed by non-physician practitioner and as supervising physician I was immediately available for consultation/collaboration.   Richardean Canal, MD 11/03/12 [phone]

## 2012-11-03 NOTE — H&P (Signed)
History and Physical       Hospital Admission Note Date: 11/03/2012  Patient name: [name] Medical record number: [id number] Date of birth: August 06, 1955 Age: 57 y.o. Gender: male PCP: No PCP    Chief Complaint:  Chest pain since 3 AM in the morning  HPI: Patient is a 57 year old male with history of hypertension, prior TIA, diabetes mellitus type 2 (not taking any medications), hyperlipidemia, nicotine abuse, ongoing drug abuse (cocaine, last use on Sunday) presented to [facility] ED with chest pain. Patient was previously admitted in November 2013 when he was having the chest pain and underwent cardiac cath which showed long segmental disease with focal rupture in a subbranch of the OM2, but patient stated that he left AMA at that time. However since November of 2013 he has been having intermittent episodes of chest pain. He had chest pain (described as too elephants sitting on my chest)  this morning at 3 AM which woke him up from his sleep, associated with numbness and tingling in his left arm, right arm heaviness and diaphoresis. Patient took 3 nitroglycerin sublingual which improved the chest pain. However chest pain returned at 10 am, he took another nitroglycerin x3 and went to ED. Per patient his chest pain did improve briefly however while en-route to [facility], CP returned back. At the time of my examination patient is on nitroglycerin drip, 58mcg/min and CP is 5/10. Troponin x1 is negative, EKG does not show any acute ST-T wave changes suggestive of ischemia. [facility]ology was consulted and recommended SDU admission to hospitalist service.  Patient also endorses medical noncompliance, he's not taking metformin for diabetes due to diarrhea.   Review of Systems:  Constitutional: Denies fever, chills, diaphoresis, poor appetite and fatigue.  HEENT: Denies photophobia, eye pain, redness, hearing loss, ear pain, congestion,  sore throat, rhinorrhea, sneezing, mouth sores, trouble swallowing, neck pain, neck stiffness and tinnitus.   Respiratory: Denies cough, wheezing.   Cardiovascular: See history of present illness  Gastrointestinal: Denies nausea, vomiting, abdominal pain, diarrhea, constipation, blood in stool and abdominal distention.  Genitourinary: Denies dysuria, urgency, frequency, hematuria, flank pain and difficulty urinating.  Musculoskeletal: Denies myalgias, back pain, joint swelling, arthralgias and gait problem.  Skin: Denies pallor, rash and wound.  Neurological: Denies dizziness, seizures, syncope, weakness, light-headedness, numbness and headaches.  Hematological: Denies adenopathy. Easy bruising, personal or family bleeding history  Psychiatric/Behavioral: Denies suicidal ideation, mood changes, confusion, nervousness, sleep disturbance and agitation  Past Medical History: Past Medical History  Diagnosis Date  . Hypertension     Was on metoprolol, patient decided to stop taking it about two years ago  . TIA (transient ischemic attack) 1997    No residual neurological deficits.  . Pneumonia   . DM (diabetes mellitus) 2011    Was on metformin, but patient stopped taking it after 4 mo due to diarrhea  . Hepatitis late 1970s    Treated in the hospital, finished treatment course. No recurrence  since   Past Surgical History  Procedure Laterality Date  . Appendectomy      Medications: Prior to Admission medications   Medication Sig Start Date End Date Taking? Authorizing Provider  atorvastatin (LIPITOR) 20 MG tablet Take 1 tablet (20 mg total) by mouth daily at 6 PM. 06/03/12  Yes Lorretta Harp, MD  clopidogrel (PLAVIX) 75 MG tablet Take 1 tablet (75 mg total) by mouth daily with breakfast. 06/03/12  Yes Lorretta Harp, MD  diltiazem (CARDIZEM) 60 MG tablet Take  1 tablet (60 mg total) by mouth every 6 (six) hours. 06/03/12  Yes Lorretta Harp, MD  isosorbide mononitrate (IMDUR) 30 MG 24 hr tablet Take  1 tablet (30 mg total) by mouth daily. 06/03/12  Yes Lorretta Harp, MD  lisinopril (PRINIVIL,ZESTRIL) 2.5 MG tablet Take 1 tablet (2.5 mg total) by mouth daily. 06/03/12  Yes Lorretta Harp, MD  nitroGLYCERIN (NITROSTAT) 0.4 MG SL tablet Place 1 tablet (0.4 mg total) under the tongue every 5 (five) minutes as needed for chest pain. 06/03/12  Yes Lorretta Harp, MD    Allergies:  No Known Allergies  Social History:  reports that he has been smoking Cigarettes.  He has been smoking one pack per day for last 40 years.  He reports that he uses illicit drugs (Cocaine), last use on Sunday 5 days ago. He reports that he does not drink alcohol. He lives at home with his roommate Family History: Family History  Problem Relation Age of Onset  . Hypertension    . Diabetes    . Stroke    . Cancer Father     Lung  . Cancer Mother     Thyroid  . Cancer Maternal Grandmother     Breast  . Cancer Maternal Grandfather     Throat and stomach    Physical Exam: Blood pressure 146/75, pulse 63, temperature 97.6 F (36.4 C), temperature source Oral, resp. rate 16, height 6' (1.829 m), weight 105.4 kg (232 lb 5.8 oz), SpO2 98.00%. General: Alert, awake, oriented x3, in no acute distress. HEENT: normocephalic, atraumatic, anicteric sclera, pink conjunctiva, PERLA, oroharynx clear Neck: supple, no masses or lymphadenopathy, no goiter, no bruits  Heart: Regular rate and rhythm, without murmurs, rubs or gallops. Lungs: Clear to auscultation bilaterally, no wheezing, rales or rhonchi. Abdomen: Soft, nontender, nondistended, positive bowel sounds, no masses. Extremities: No clubbing, cyanosis or edema with positive pedal pulses. Neuro: Grossly intact, no focal neurological deficits, strength 5/5 upper and lower extremities bilaterally Psych: alert and oriented x 3, normal mood and affect Skin: no rashes or lesions, warm and dry   LABS on Admission:  Basic Metabolic Panel:  Recent Labs Lab 11/03/12 1210  NA 138   K 4.0  CL 101  CO2 25  GLUCOSE 165*  BUN 14  CREATININE 1.40*  CALCIUM 9.6   CBC:  Recent Labs Lab 11/03/12 1210  WBC 9.1  NEUTROABS 6.6  HGB 15.9  HCT 46.1  MCV 84.3  PLT 215   Cardiac Enzymes:  Recent Labs Lab 11/03/12 1210  TROPONINI <0.30   BNP: No components found with this basename: POCBNP,  CBG:  Recent Labs Lab 11/03/12 1432  GLUCAP 127*     Radiological Exams on Admission: Dg Chest 2 View  11/03/2012  *RADIOLOGY REPORT*  Clinical Data: Intermittent chest pain for 5 months, more constant today.  CHEST - 2 VIEW  Comparison: 06/01/2012 and 05/31/2012.  Findings: The heart size and mediastinal contours are stable. There is new central interstitial prominence which may reflect central airway thickening.  There is no edema, airspace disease or significant pleural effusion.  IMPRESSION: Suspected mild central airway thickening suggesting bronchitis.  No evidence of pneumonia or edema.   Original Report Authenticated By: Carey Bullocks, M.D.    EKG shows rate of 77, normal sinus rhythm no acute ST-T wave changes suggestive of ischemia  Assessment/Plan Principal Problem:   Unstable angina: High risk factors of medical noncompliance, hypertension, hyperlipidemia, known coronary artery disease from the previous cath in November  2013, ongoing nicotine and cocaine abuse, Diabetes mellitus, type 2 - Admit to step down, patient is currently on nitroglycerin drip, titrate to chest pain free - Start heparin drip, continue Plavix, statin, n.p.o. except meds if patient is going to need cardiac cath today - No beta blockers due to ongoing cocaine abuse - Obtain serial cardiac enzymes x3 - [facility] consult placed for further management   Active Problems:   Hypertension: Currently stable, continue nitroglycerin drip    Diabetes mellitus type 2, diet-controlled, Non-compliant - Currently n.p.o., Will place him on sliding scale insulin -  obtain hemoglobin  A1c    Tobacco abuse - Placed on a nicotine patch. Patient was counseled strongly against nicotine use    H/O medication noncompliance - Discussed with patient to be compliant with his medical care    Cocaine abuse - UDS is positive for cocaine and patient admitted to cocaine use last Sunday 5 days ago  DVT prophylaxis: Heparin drip  CODE STATUS: Discussed in detail with the patient, limited code, OKAY for vasopressors and antiarrhythmics. NO ACLS/CPR/no intubation  Further plan will depend as patient's clinical course evolves and further radiologic and laboratory data become available.   Time Spent on Admission: 1 hour  Jhanae Jaskowiak M.D. [facility] 11/03/2012, 4:19 PM Pager: [phone]  If 7PM-7AM, please contact night-coverage [email or web address] Password [email or web address]

## 2012-11-03 NOTE — ED Notes (Signed)
Called report to [place] on 2900. Room assignmt changed to [id number], [facility] aware.

## 2012-11-03 NOTE — ED Provider Notes (Signed)
History     CSN: [id number]  Arrival date & time 11/03/12  1151   First MD Initiated Contact with Patient 11/03/12 1206      Chief Complaint  Patient presents with  . Chest Pain    (Consider location/radiation/quality/duration/timing/severity/associated sxs/prior treatment) HPI  [name] is a 57 year old male who abuses cocaine, last use on Sunday coming in for complaints of chest pains. He has been having pains on and off since November 2013 and they continue to worsen. The episode today that brings him in started this morning at 3am and woke him from his sleep. He endorses being diaphoretic. He has taken a total of 7 nitro tablets today which relieves the pain for about an hour and then it comes back. The pain is located substernal then radiates to the left side of his chest. His right arm felt heavy and his left arm had pain radiating down it. No vomiting, nausea or loss of consciousness. In November 2013 the patient was admitted for chest pain and Cardiac Cath was performed showing long segmental disease with focal rupture in a sub-branch of the OM2 as noted above. He needed a stent placement but was determined not to be a good candidate due to noncompliance and continued cocaine usage. He says he has been complaint with his heart medications.He is a diabetic and does not take his Metformin because it makes him feel sick. He no longer takes Aspirin because it made him bleed for too long when he would scratch himself. He took himself off of both of these medications. He is currently experiencing chest pressure. Vss. No ST elevation on EKG.  Past Medical History  Diagnosis Date  . Hypertension     Was on metoprolol, patient decided to stop taking it about two years ago  . TIA (transient ischemic attack) 1997    No residual neurological deficits.  . Pneumonia   . DM (diabetes mellitus) 2011    Was on metformin, but patient stopped taking it after 4 mo due to diarrhea  . Hepatitis  late 1970s    Treated in the hospital, finished treatment course. No recurrence  since    Past Surgical History  Procedure Laterality Date  . Appendectomy      Family History  Problem Relation Age of Onset  . Hypertension    . Diabetes    . Stroke    . Cancer Father     Lung  . Cancer Mother     Thyroid  . Cancer Maternal Grandmother     Breast  . Cancer Maternal Grandfather     Throat and stomach    History  Substance Use Topics  . Smoking status: Current Every Day Smoker    Types: Cigarettes  . Smokeless tobacco: Not on file     Comment: Patient does not think it is a problem for him at this time.  . Alcohol Use: No      Review of Systems  All other systems reviewed and are negative.    Allergies  Review of patient's allergies indicates no known allergies.  Home Medications   Current Outpatient Rx  Name  Route  Sig  Dispense  Refill  . aspirin EC 81 MG EC tablet   Oral   Take 1 tablet (81 mg total) by mouth daily.   90 tablet   3   . atorvastatin (LIPITOR) 20 MG tablet   Oral   Take 1 tablet (20 mg total) by mouth  daily at 6 PM.   30 tablet   5   . clopidogrel (PLAVIX) 75 MG tablet   Oral   Take 1 tablet (75 mg total) by mouth daily with breakfast.   30 tablet   5   . diltiazem (CARDIZEM) 60 MG tablet   Oral   Take 1 tablet (60 mg total) by mouth every 6 (six) hours.   120 tablet   2   . isosorbide mononitrate (IMDUR) 30 MG 24 hr tablet   Oral   Take 1 tablet (30 mg total) by mouth daily.   30 tablet   3   . lisinopril (PRINIVIL,ZESTRIL) 2.5 MG tablet   Oral   Take 1 tablet (2.5 mg total) by mouth daily.   30 tablet   5   . nitroGLYCERIN (NITROSTAT) 0.4 MG SL tablet   Sublingual   Place 1 tablet (0.4 mg total) under the tongue every 5 (five) minutes as needed for chest pain.   30 tablet   1     BP 116/63  Pulse 74  Resp 16  SpO2 98%  Physical Exam  Nursing note and vitals reviewed. Constitutional: He appears  well-developed and well-nourished. No distress.  HENT:  Head: Normocephalic and atraumatic.  Eyes: Pupils are equal, round, and reactive to light.  Neck: Normal range of motion. Neck supple.  Cardiovascular: Normal rate and regular rhythm.   Pulmonary/Chest: Effort normal.  Abdominal: Soft.  Neurological: He is alert.  Skin: Skin is warm and dry.    ED Course  Procedures (including critical care time)  Labs Reviewed  BASIC METABOLIC PANEL - Abnormal; Notable for the following:    Glucose, Bld 165 (*)    Creatinine, Ser 1.40 (*)    GFR calc non Af Amer 54 (*)    GFR calc Af Amer 63 (*)    All other components within normal limits  URINE RAPID DRUG SCREEN (HOSP PERFORMED) - Abnormal; Notable for the following:    Cocaine POSITIVE (*)    All other components within normal limits  CBC WITH DIFFERENTIAL  TROPONIN I   Dg Chest 2 View  11/03/2012  *RADIOLOGY REPORT*  Clinical Data: Intermittent chest pain for 5 months, more constant today.  CHEST - 2 VIEW  Comparison: 06/01/2012 and 05/31/2012.  Findings: The heart size and mediastinal contours are stable. There is new central interstitial prominence which may reflect central airway thickening.  There is no edema, airspace disease or significant pleural effusion.  IMPRESSION: Suspected mild central airway thickening suggesting bronchitis.  No evidence of pneumonia or edema.   Original Report Authenticated By: Carey Bullocks, M.D.      1. Unstable angina       MDM   Date: 11/03/2012  Rate: 77  Rhythm: normal sinus rhythm  QRS Axis: normal  Intervals: normal  ST/T Wave abnormalities: normal  Conduction Disutrbances:none  Narrative Interpretation:   Old EKG Reviewed: unchanged  CRITICAL CARE Performed by: Dorthula Matas   Total critical care time: 35  Critical care time was exclusive of separately billable procedures and treating other patients.  Critical care was necessary to treat or prevent imminent or  life-threatening deterioration.  Critical care was time spent personally by me on the following activities: development of treatment plan with patient and/or surrogate as well as nursing, discussions with consultants, evaluation of patient's response to treatment, examination of patient, obtaining history from patient or surrogate, ordering and performing treatments and interventions, ordering and review of laboratory studies, ordering  and review of radiographic studies, pulse oximetry and re-evaluation of patient's condition.   12:30pm- Discussed case with Dr. Chaney Malling, Will give 324 Asprin and start on Nitro Drip. Labs pending.  1;14pm- Spoke with Dr. Patty Sermons with [facility]. He will consult on patient but requests he be admitted by [facility] to [facility] unit because of his cocaine use, diabetes and not going to need a Cath.    1:42pm- Patient will be transferred to [facility] unit, Admitting physician is Dr. Susie Cassette with[facility].  EMTALA completed. Temp admit orders placed. Pt stable for transfer  Dorthula Matas, PA-C 11/03/12 1343  Dorthula Matas, PA-C 11/03/12 [phone]

## 2012-11-03 NOTE — Progress Notes (Addendum)
57 year old male who abuses cocaine, last use on Sunday coming in for complaints of chest pains. He has been having pains on and off since November 2013 , had a cath, non amenable to interevention, Dr Patty Sermons called , will consult when admitted  Admit to step down due to ongoing chest pain

## 2012-11-04 DIAGNOSIS — E7889 Other lipoprotein metabolism disorders: Secondary | ICD-10-CM | POA: Diagnosis present

## 2012-11-04 DIAGNOSIS — F411 Generalized anxiety disorder: Secondary | ICD-10-CM | POA: Diagnosis present

## 2012-11-04 DIAGNOSIS — F1410 Cocaine abuse, uncomplicated: Secondary | ICD-10-CM

## 2012-11-04 DIAGNOSIS — I2510 Atherosclerotic heart disease of native coronary artery without angina pectoris: Secondary | ICD-10-CM | POA: Diagnosis present

## 2012-11-04 LAB — CBC
MCV: 83.1 fL (ref 78.0–100.0)
RBC: 4.85 MIL/uL (ref 4.22–5.81)
RDW: 13.4 % (ref 11.5–15.5)

## 2012-11-04 LAB — LIPID PANEL
Cholesterol: 115 mg/dL (ref 0–200)
HDL: 23 mg/dL — ABNORMAL LOW (ref >39)
LDL Cholesterol: 45 mg/dL (ref 0–99)
Triglycerides: 234 mg/dL — ABNORMAL HIGH (ref <150)

## 2012-11-04 LAB — BASIC METABOLIC PANEL
BUN: 15 mg/dL (ref 6–23)
Chloride: 106 mEq/L (ref 96–112)
GFR calc Af Amer: 82 mL/min — ABNORMAL LOW (ref >90)
Potassium: 3.9 mEq/L (ref 3.5–5.1)
Sodium: 137 mEq/L (ref 135–145)

## 2012-11-04 LAB — CK TOTAL AND CKMB (NOT AT ARMC)
Relative Index: INVALID (ref 0.0–2.5)
Total CK: 55 U/L (ref 7–232)

## 2012-11-04 LAB — HEPARIN LEVEL (UNFRACTIONATED): Heparin Unfractionated: 0.32 IU/mL (ref 0.30–0.70)

## 2012-11-04 MED ORDER — ALPRAZOLAM 0.5 MG PO TABS
0.5000 mg | ORAL_TABLET | Freq: Four times a day (QID) | ORAL | Status: DC | PRN
  Administered 2012-11-04 – 2012-11-05 (×3): 0.5 mg via ORAL
  Filled 2012-11-04 (×3): qty 1

## 2012-11-04 MED ORDER — ASPIRIN 81 MG PO CHEW
81.0000 mg | CHEWABLE_TABLET | Freq: Every day | ORAL | Status: DC
  Administered 2012-11-04: 81 mg via ORAL

## 2012-11-04 MED ORDER — DILTIAZEM HCL 60 MG PO TABS
60.0000 mg | ORAL_TABLET | Freq: Four times a day (QID) | ORAL | Status: AC
  Administered 2012-11-04: 60 mg via ORAL
  Filled 2012-11-04 (×3): qty 1

## 2012-11-04 MED ORDER — DILTIAZEM HCL ER COATED BEADS 240 MG PO CP24
240.0000 mg | ORAL_CAPSULE | Freq: Every day | ORAL | Status: DC
  Administered 2012-11-05: 240 mg via ORAL
  Filled 2012-11-04: qty 1

## 2012-11-04 MED ORDER — ENOXAPARIN SODIUM 40 MG/0.4ML ~~LOC~~ SOLN
40.0000 mg | SUBCUTANEOUS | Status: DC
  Administered 2012-11-04: 40 mg via SUBCUTANEOUS
  Filled 2012-11-04 (×2): qty 0.4

## 2012-11-04 MED ORDER — ISOSORBIDE MONONITRATE ER 60 MG PO TB24
60.0000 mg | ORAL_TABLET | Freq: Every day | ORAL | Status: DC
  Administered 2012-11-04 – 2012-11-05 (×2): 60 mg via ORAL
  Filled 2012-11-04 (×3): qty 1

## 2012-11-04 MED ORDER — NITROGLYCERIN 0.4 MG SL SUBL
SUBLINGUAL_TABLET | SUBLINGUAL | Status: AC
  Administered 2012-11-04: 0.4 mg
  Filled 2012-11-04: qty 25

## 2012-11-04 MED ORDER — LISINOPRIL 2.5 MG PO TABS
2.5000 mg | ORAL_TABLET | Freq: Every day | ORAL | Status: DC
  Administered 2012-11-05: 2.5 mg via ORAL
  Filled 2012-11-04: qty 1

## 2012-11-04 NOTE — Progress Notes (Signed)
ANTICOAGULATION CONSULT NOTE - Follow Up Consult  Pharmacy Consult for Heparin Indication: chest pain/ACS  No Known Allergies  Patient Measurements: Height: 6' (182.9 cm) Weight: 232 lb 5.8 oz (105.4 kg) IBW/kg (Calculated) : 77.6 Heparin Dosing Weight: 99.5kg  Vital Signs: Temp: 97.7 F (36.5 C) (04/18 0743) Temp src: Oral (04/18 0743) BP: 129/63 mmHg (04/18 0000) Pulse Rate: 76 (04/18 0000)  Labs:  Recent Labs  11/03/12 1210 11/03/12 1633 11/03/12 2202 11/03/12 2206 11/04/12 0425  HGB 15.9  --   --   --  14.0  HCT 46.1  --   --   --  40.3  PLT 215  --   --   --  182  HEPARINUNFRC  --   --  <0.10*  --  0.32  CREATININE 1.40*  --   --   --  1.12  CKTOTAL  --  85  --  71 55  CKMB  --  2.2  --  2.1 2.2  TROPONINI <0.30 <0.30  --  <0.30 <0.30    Estimated Creatinine Clearance: 91.3 ml/min (by C-G formula based on Cr of 1.12).   Medications:  Heparin 1900 units/hr  Assessment: 56yom on heparin for CP/ACS. Heparin level (0.32) is now therapeutic but on the low end of goal - will increase rate slightly and check follow-up level to confirm therapeutic. Noted current plans to treat medically and not to cath. - H/H and Plts wnl - No significant bleeding reported  Goal of Therapy:  Heparin level 0.3-0.7 units/ml Monitor platelets by anticoagulation protocol: Yes   Plan:  1. Increase heparin drip to 2000 units/hr (20 ml/hr) 2. Check heparin level @ 1330 to confirm therapeutic  3. Follow-up anticoagulation plans  Cleon Dew [phone] 11/04/2012,8:57 AM

## 2012-11-04 NOTE — Care Management Note (Signed)
    Page 1 of 1   11/04/2012     4:26:00 PM   CARE MANAGEMENT NOTE 11/04/2012  Patient:  [name], [name]   Account Number:  [id number]  Date Initiated:  11/04/2012  Documentation initiated by:  Alvira Philips Assessment:   57 yr-old male adm with dx of [place]; lives alone     In-house referral  Artist      DC Planning Services  CM consult      Per UR Regulation:  Reviewed for med. necessity/level of care/duration of stay  Comments:  11/04/12 1049 Shaquill Iseman RN BSN MSN CCM Received referral for medication assistance as pt has no insurance.  Pt states he was told to make appt with [facility] and would complete application for orange card.  Per pt, he made multiple attempts to make appt and was told someone would call him to arrange, but no one did.  CM found that [facility] is closed today for holiday - will contact clinic on his behalf on Monday.  Pt is from [facility] so would not qualify for [place], provided information re [facility] and [facility].  Also provided contact information re [facility] adult health clinic. Provided discount coupons for lipitor, diltiazem, atorvastatin, and plavix as well as information about accessing pharmaceutical company pt assistance programs. Imdur is on [employer] $4 list.

## 2012-11-04 NOTE — Progress Notes (Signed)
Subjective:  Patient is feeling better this am. Chest pain is diminishing. Repeat EKG normal Cardiac enzymes are normal  Objective:  Vital Signs in the last 24 hours: Temp:  [97.6 F (36.4 C)-98.7 F (37.1 C)] 97.7 F (36.5 C) (04/18 0743) Pulse Rate:  [63-88] 76 (04/18 0000) Resp:  [16-23] 22 (04/18 0000) BP: (107-146)/(48-77) 129/63 mmHg (04/18 0000) SpO2:  [88 %-98 %] 90 % (04/18 0000) Weight:  [232 lb 5.8 oz (105.4 kg)] 232 lb 5.8 oz (105.4 kg) (04/17 1608)  Intake/Output from previous day: 04/17 0701 - 04/18 0700 In: 420.3 [P.O.:240; I.V.:180.3] Out: 1850 [Urine:1850] Intake/Output from this shift: Total I/O In: 360 [P.O.:360] Out: 1000 [Urine:1000]  . atorvastatin  20 mg Oral q1800  . clopidogrel  75 mg Oral Q breakfast  . isosorbide mononitrate  60 mg Oral Daily  . nicotine  21 mg Transdermal Daily  . pantoprazole  40 mg Oral Daily  . pneumococcal 23 valent vaccine  0.5 mL Intramuscular Tomorrow-1000  . sodium chloride  3 mL Intravenous Q12H   . sodium chloride 75 mL/hr at 11/04/12 1610    Physical Exam: The patient appears to be in no distress.  Head and neck exam reveals that the pupils are equal and reactive.  The extraocular movements are full.  There is no scleral icterus.  Mouth and pharynx are benign.  No lymphadenopathy.  No carotid bruits.  The jugular venous pressure is normal.  Thyroid is not enlarged or tender.  Chest is clear to percussion and auscultation.  No rales or rhonchi.  Expansion of the chest is symmetrical.  Heart reveals no abnormal lift or heave.  First and second heart sounds are normal.  There is no murmur gallop rub or click.  The abdomen is soft and nontender.  Bowel sounds are normoactive.  There is no hepatosplenomegaly or mass.  There are no abdominal bruits.  Extremities reveal no phlebitis or edema.  Pedal pulses are good.  There is no cyanosis or clubbing.  Neurologic exam is normal strength and no lateralizing weakness.   No sensory deficits.  Integument reveals no rash  Lab Results:  Recent Labs  11/03/12 1210 11/04/12 0425  WBC 9.1 10.9*  HGB 15.9 14.0  PLT 215 182    Recent Labs  11/03/12 1210 11/04/12 0425  NA 138 137  K 4.0 3.9  CL 101 106  CO2 25 23  GLUCOSE 165* 166*  BUN 14 15  CREATININE 1.40* 1.12    Recent Labs  11/03/12 2206 11/04/12 0425  TROPONINI <0.30 <0.30   Hepatic Function Panel No results found for this basename: PROT, ALBUMIN, AST, ALT, ALKPHOS, BILITOT, BILIDIR, IBILI,  in the last 72 hours  Recent Labs  11/04/12 0425  CHOL 115   No results found for this basename: PROTIME,  in the last 72 hours  Imaging: Dg Chest 2 View  11/03/2012  *RADIOLOGY REPORT*  Clinical Data: Intermittent chest pain for 5 months, more constant today.  CHEST - 2 VIEW  Comparison: 06/01/2012 and 05/31/2012.  Findings: The heart size and mediastinal contours are stable. There is new central interstitial prominence which may reflect central airway thickening.  There is no edema, airspace disease or significant pleural effusion.  IMPRESSION: Suspected mild central airway thickening suggesting bronchitis.  No evidence of pneumonia or edema.   Original Report Authenticated By: Carey Bullocks, M.D.     Cardiac Studies: Telemetry normal Assessment/Plan:  Known coronary disease, no evidence of heart damage. Cocaine abuse.  Recommend: resume baby ASA which he took at home. Continue Plavix.  Okay for discharge later today from cardiac standpoint. Continue home dose of diltiazem. Add imdur to home regimen. Stressed future avoidance of cocaine.  Needs to get a PCP.  LOS: 1 day    Cassell Clement 11/04/2012, 10:35 AM

## 2012-11-04 NOTE — Consult Note (Signed)
Reason for Consult: Cocaine intoxication and chest pain Referring Physician: Dr. Cammy Brochure is an 57 y.o. male.  HPI: Patient was seen and chart reviewed. Patient stated that he has been abusing cocaine since 1988 was incarcerated 2008 for three years for drug charges. He has been sober until eight years ago. He has been abusing once a month. He has heart attack while in jail and wish to be stopped. He was homosexual and has no partner or children. His mom and brother is supportive. He has been depressed but denied symptoms of anxiety and psychosis. His UDS is positive for cocaine and no detox required and interest in substance rehab services. He was previously [facility] at [facility]   MSE: He was calm and cooperative. He has depressed mood and constrited affect. He has normal speech and thought process and denied SI/HI and no evidence of psychosis. He has fair insight, poor judgment and impulses.  Past Medical History  Diagnosis Date  . Hypertension     Was on metoprolol, patient decided to stop taking it about two years ago  . TIA (transient ischemic attack) 1997    No residual neurological deficits.  . Pneumonia   . DM (diabetes mellitus) 2011    Was on metformin, but patient stopped taking it after 4 mo due to diarrhea  . Hepatitis late 1970s    Treated in the hospital, finished treatment course. No recurrence  since    Past Surgical History  Procedure Laterality Date  . Appendectomy      Family History  Problem Relation Age of Onset  . Hypertension    . Diabetes    . Stroke    . Cancer Father     Lung  . Cancer Mother     Thyroid  . Cancer Maternal Grandmother     Breast  . Cancer Maternal Grandfather     Throat and stomach    Social History:  reports that he has been smoking Cigarettes.  He has been smoking about 0.00 packs per day. He does not have any smokeless tobacco history on file. He reports that he uses illicit drugs (Cocaine). He reports that  he does not drink alcohol.  Allergies: No Known Allergies  Medications: I have reviewed the patient's current medications.  Results for orders placed during the hospital encounter of 11/03/12 (from the past 48 hour(s))  CBC WITH DIFFERENTIAL     Status: None   Collection Time    11/03/12 12:10 PM      Result Value Range   WBC 9.1  4.0 - 10.5 K/uL   RBC 5.47  4.22 - 5.81 MIL/uL   Hemoglobin 15.9  13.0 - 17.0 g/dL   HCT 16.1  09.6 - 04.5 %   MCV 84.3  78.0 - 100.0 fL   MCH 29.1  26.0 - 34.0 pg   MCHC 34.5  30.0 - 36.0 g/dL   RDW 40.9  81.1 - 91.4 %   Platelets 215  150 - 400 K/uL   Neutrophils Relative 72  43 - 77 %   Neutro Abs 6.6  1.7 - 7.7 K/uL   Lymphocytes Relative 21  12 - 46 %   Lymphs Abs 1.9  0.7 - 4.0 K/uL   Monocytes Relative 6  3 - 12 %   Monocytes Absolute 0.5  0.1 - 1.0 K/uL   Eosinophils Relative 2  0 - 5 %   Eosinophils Absolute 0.1  0.0 - 0.7 K/uL  Basophils Relative 0  0 - 1 %   Basophils Absolute 0.0  0.0 - 0.1 K/uL  BASIC METABOLIC PANEL     Status: Abnormal   Collection Time    11/03/12 12:10 PM      Result Value Range   Sodium 138  135 - 145 mEq/L   Potassium 4.0  3.5 - 5.1 mEq/L   Chloride 101  96 - 112 mEq/L   CO2 25  19 - 32 mEq/L   Glucose, Bld 165 (*) 70 - 99 mg/dL   BUN 14  6 - 23 mg/dL   Creatinine, Ser 1.61 (*) 0.50 - 1.35 mg/dL   Calcium 9.6  8.4 - 09.6 mg/dL   GFR calc non Af Amer 54 (*) >90 mL/min   GFR calc Af Amer 63 (*) >90 mL/min   Comment:            The eGFR has been calculated     using the CKD EPI equation.     This calculation has not been     validated in all clinical     situations.     eGFR's persistently     <90 mL/min signify     possible Chronic Kidney Disease.  TROPONIN I     Status: None   Collection Time    11/03/12 12:10 PM      Result Value Range   Troponin I <0.30  <0.30 ng/mL   Comment:            Due to the release kinetics of cTnI,     a negative result within the first hours     of the onset of  symptoms does not rule out     myocardial infarction with certainty.     If myocardial infarction is still suspected,     repeat the test at appropriate intervals.  URINE RAPID DRUG SCREEN (HOSP PERFORMED)     Status: Abnormal   Collection Time    11/03/12 12:20 PM      Result Value Range   Opiates NONE DETECTED  NONE DETECTED   Cocaine POSITIVE (*) NONE DETECTED   Benzodiazepines NONE DETECTED  NONE DETECTED   Amphetamines NONE DETECTED  NONE DETECTED   Tetrahydrocannabinol NONE DETECTED  NONE DETECTED   Barbiturates NONE DETECTED  NONE DETECTED   Comment:            DRUG SCREEN FOR MEDICAL PURPOSES     ONLY.  IF CONFIRMATION IS NEEDED     FOR ANY PURPOSE, NOTIFY LAB     WITHIN 5 DAYS.                LOWEST DETECTABLE LIMITS     FOR URINE DRUG SCREEN     Drug Class       Cutoff (ng/mL)     Amphetamine      1000     Barbiturate      200     Benzodiazepine   200     Tricyclics       300     Opiates          300     Cocaine          300     THC              50  GLUCOSE, CAPILLARY     Status: Abnormal   Collection Time    11/03/12  2:32 PM  Result Value Range   Glucose-Capillary 127 (*) 70 - 99 mg/dL   Comment 1 Documented in Chart     Comment 2 Notify RN    MRSA PCR SCREENING     Status: None   Collection Time    11/03/12  3:57 PM      Result Value Range   MRSA by PCR NEGATIVE  NEGATIVE   Comment:            The GeneXpert MRSA Assay (FDA     approved for NASAL specimens     only), is one component of a     comprehensive MRSA colonization     surveillance program. It is not     intended to diagnose MRSA     infection nor to guide or     monitor treatment for     MRSA infections.  GLUCOSE, CAPILLARY     Status: Abnormal   Collection Time    11/03/12  4:10 PM      Result Value Range   Glucose-Capillary 163 (*) 70 - 99 mg/dL  TROPONIN I     Status: None   Collection Time    11/03/12  4:33 PM      Result Value Range   Troponin I <0.30  <0.30 ng/mL   Comment:             Due to the release kinetics of cTnI,     a negative result within the first hours     of the onset of symptoms does not rule out     myocardial infarction with certainty.     If myocardial infarction is still suspected,     repeat the test at appropriate intervals.  CK TOTAL AND CKMB     Status: None   Collection Time    11/03/12  4:33 PM      Result Value Range   Total CK 85  7 - 232 U/L   CK, MB 2.2  0.3 - 4.0 ng/mL   Relative Index RELATIVE INDEX IS INVALID  0.0 - 2.5   Comment: WHEN CK < 100 U/L             HEMOGLOBIN A1C     Status: Abnormal   Collection Time    11/03/12  4:48 PM      Result Value Range   Hemoglobin A1C 7.2 (*) <5.7 %   Comment: (NOTE)                                                                               According to the ADA Clinical Practice Recommendations for 2011, when     HbA1c is used as a screening test:      >=6.5%   Diagnostic of Diabetes Mellitus               (if abnormal result is confirmed)     5.7-6.4%   Increased risk of developing Diabetes Mellitus     References:Diagnosis and Classification of Diabetes Mellitus,Diabetes     Care,2011,34(Suppl 1):S62-S69 and Standards of Medical Care in             Diabetes - 2011,Diabetes Care,2011,34 (Suppl 1):S11-S61.  CORRECTED ON 04/17 AT 2346: PREVIOUSLY REPORTED AS 7.2 Reference range: <5.7   Mean Plasma Glucose 160 (*) <117 mg/dL  HEPARIN LEVEL (UNFRACTIONATED)     Status: Abnormal   Collection Time    11/03/12 10:02 PM      Result Value Range   Heparin Unfractionated <0.10 (*) 0.30 - 0.70 IU/mL   Comment:            IF HEPARIN RESULTS ARE BELOW     EXPECTED VALUES, AND PATIENT     DOSAGE HAS BEEN CONFIRMED,     SUGGEST FOLLOW UP TESTING     OF ANTITHROMBIN III LEVELS.  TROPONIN I     Status: None   Collection Time    11/03/12 10:06 PM      Result Value Range   Troponin I <0.30  <0.30 ng/mL   Comment:            Due to the release kinetics of cTnI,     a negative  result within the first hours     of the onset of symptoms does not rule out     myocardial infarction with certainty.     If myocardial infarction is still suspected,     repeat the test at appropriate intervals.  CK TOTAL AND CKMB     Status: None   Collection Time    11/03/12 10:06 PM      Result Value Range   Total CK 71  7 - 232 U/L   CK, MB 2.1  0.3 - 4.0 ng/mL   Relative Index RELATIVE INDEX IS INVALID  0.0 - 2.5   Comment: WHEN CK < 100 U/L             TROPONIN I     Status: None   Collection Time    11/04/12  4:25 AM      Result Value Range   Troponin I <0.30  <0.30 ng/mL   Comment:            Due to the release kinetics of cTnI,     a negative result within the first hours     of the onset of symptoms does not rule out     myocardial infarction with certainty.     If myocardial infarction is still suspected,     repeat the test at appropriate intervals.  CK TOTAL AND CKMB     Status: None   Collection Time    11/04/12  4:25 AM      Result Value Range   Total CK 55  7 - 232 U/L   CK, MB 2.2  0.3 - 4.0 ng/mL   Relative Index RELATIVE INDEX IS INVALID  0.0 - 2.5   Comment: WHEN CK < 100 U/L             CBC     Status: Abnormal   Collection Time    11/04/12  4:25 AM      Result Value Range   WBC 10.9 (*) 4.0 - 10.5 K/uL   RBC 4.85  4.22 - 5.81 MIL/uL   Hemoglobin 14.0  13.0 - 17.0 g/dL   HCT 47.8  29.5 - 62.1 %   MCV 83.1  78.0 - 100.0 fL   MCH 28.9  26.0 - 34.0 pg   MCHC 34.7  30.0 - 36.0 g/dL   RDW 30.8  65.7 - 84.6 %   Platelets 182  150 - 400 K/uL  BASIC METABOLIC  PANEL     Status: Abnormal   Collection Time    11/04/12  4:25 AM      Result Value Range   Sodium 137  135 - 145 mEq/L   Potassium 3.9  3.5 - 5.1 mEq/L   Chloride 106  96 - 112 mEq/L   CO2 23  19 - 32 mEq/L   Glucose, Bld 166 (*) 70 - 99 mg/dL   BUN 15  6 - 23 mg/dL   Creatinine, Ser 4.09  0.50 - 1.35 mg/dL   Calcium 8.9  8.4 - 81.1 mg/dL   GFR calc non Af Amer 71 (*) >90 mL/min   GFR  calc Af Amer 82 (*) >90 mL/min   Comment:            The eGFR has been calculated     using the CKD EPI equation.     This calculation has not been     validated in all clinical     situations.     eGFR's persistently     <90 mL/min signify     possible Chronic Kidney Disease.  HEPARIN LEVEL (UNFRACTIONATED)     Status: None   Collection Time    11/04/12  4:25 AM      Result Value Range   Heparin Unfractionated 0.32  0.30 - 0.70 IU/mL   Comment:            IF HEPARIN RESULTS ARE BELOW     EXPECTED VALUES, AND PATIENT     DOSAGE HAS BEEN CONFIRMED,     SUGGEST FOLLOW UP TESTING     OF ANTITHROMBIN III LEVELS.  LIPID PANEL     Status: Abnormal   Collection Time    11/04/12  4:25 AM      Result Value Range   Cholesterol 115  0 - 200 mg/dL   Triglycerides 914 (*) <150 mg/dL   HDL 23 (*) >78 mg/dL   Total CHOL/HDL Ratio 5.0     VLDL 47 (*) 0 - 40 mg/dL   LDL Cholesterol 45  0 - 99 mg/dL   Comment:            Total Cholesterol/HDL:CHD Risk     Coronary Heart Disease Risk Table                         Men   Women      1/2 Average Risk   3.4   3.3      Average Risk       5.0   4.4      2 X Average Risk   9.6   7.1      3 X Average Risk  23.4   11.0                Use the calculated Patient Ratio     above and the CHD Risk Table     to determine the patient's CHD Risk.                ATP III CLASSIFICATION (LDL):      <100     mg/dL   Optimal      295-621  mg/dL   Near or Above                        Optimal      130-159  mg/dL   Borderline  160-189  mg/dL   High      >960     mg/dL   Very High    Dg Chest 2 View  11/03/2012  *RADIOLOGY REPORT*  Clinical Data: Intermittent chest pain for 5 months, more constant today.  CHEST - 2 VIEW  Comparison: 06/01/2012 and 05/31/2012.  Findings: The heart size and mediastinal contours are stable. There is new central interstitial prominence which may reflect central airway thickening.  There is no edema, airspace disease or  significant pleural effusion.  IMPRESSION: Suspected mild central airway thickening suggesting bronchitis.  No evidence of pneumonia or edema.   Original Report Authenticated By: Carey Bullocks, M.D.     Positive for anxiety and bad mood Blood pressure 138/90, pulse 82, temperature 98.2 F (36.8 C), temperature source Oral, resp. rate 17, height 6' (1.829 m), weight 232 lb 5.8 oz (105.4 kg), SpO2 92.00%.   Assessment/Plan: Cocaine abuse and intoxication  Recommendation: Patient does not meet criteria of acute psych hospitalization and will be referred to substance rehab services. [facility] services may be better option. Please contact social services for further details of the program. No medications recommended and he was not interested medication management from behavioral health.  Annalynn Centanni,JANARDHAHA R. 11/04/2012, 12:09 PM

## 2012-11-04 NOTE — Progress Notes (Signed)
[facility] Progress Note [place] TEAM 1 - Stepdown/ICU TEAM   [name] JXB:[id number] DOB: 1956/01/02 DOA: 11/03/2012 PCP: No primary provider on file.  Brief narrative: 57 year male patient with known history of hypertension prior TIA and diabetes. He also has ongoing cocaine abuse. He is noncompliant with taking his medications for multiple reasons but primary reason is his reported inability to afford medications. He was previously admitted November 2013 for cardiac workup. At that time revealed long segmental disease with focal rupture and a subbranch of the OM 2. Before that could be pursued patient left AMA. Since that time patient has been having intermittent chest pain described as pressure like. On the morning of admission was awakened from sleep at 3 AM.  Pain was associated with numbness and tingling in the left arm right arm heaviness and diaphoresis. The pain did decrease with 3 sublingual nitroglycerin but did not resolve. By 10 AM the pain had worsened again he took 3 more nitroglycerin and presented to the emergency department. The chest pain briefly improved but en route to the hospital the chest pain returned. In the emergency department he was started on nitroglycerin infusion. Troponins and EKG were negative for any acute ischemic changes. Cardiology was consulted. Patient endorses medical noncompliance with multiple medications include not taking metformin because of diarrhea and not being able to afford his Plavix.  Assessment/Plan:  Unstable angina/CAD (coronary artery disease), native coronary artery -appreciate Cards assistance -despite near constant CP his enzymes have remained stable and EKG without ischemic changes (including with having pain) -cont baby ASA and Plavix- we will increase Imdur to 60 and dc IV NTG/Heparin - focus will be on medical management until he demonstrates compliance with meds and cocaine abstinence -more risky to pursue PTCA/stent in  this noncompliant pt (ie in-stent re-stenosis)   Cocaine abuse -admits monthly use - wishes to NOT disclose his use to family -had been clean until 2006 -appreciate psych eval - rec FU after dc at [facility]/SW consulted  Hypertension -resume CCB and follow trend  Diabetes mellitus type 2, uncontrolled, with cardiovascular complications -not on meds pre admit due to issues with Metformin induced diarrhea -A1c 7.2 -ask diabetes educator to see  H/O medication noncompliance -some of the issues are financial so have asked CM to see  Tobacco abuse  Generalized anxiety disorder  -psych says no indication for IP treatment -prn Xanax  Lipids abnormal -resume Lipitor- recommend generic option  DVT prophylaxis: Lovenox Code Status: No intubation - No CPR Family Communication: No family present at time of visit Disposition Plan: Telemetry Isolation: None  Consultants: Cardiology Psychiatry  Procedures: None  Antibiotics: None  HPI/Subjective: Patient alert and continues to complain of waxing and waning chest discomfort in the absence of EKG or enzyme changes. Seems anxious and when questioned about this endorsed he was a little anxious. Was counseled regarding medication compliance and complete abstinence from cocaine.   Objective: Blood pressure 138/90, pulse 82, temperature 98.2 F (36.8 C), temperature source Oral, resp. rate 17, height 6' (1.829 m), weight 105.4 kg (232 lb 5.8 oz), SpO2 92.00%.  Intake/Output Summary (Last 24 hours) at 11/04/12 1313 Last data filed at 11/04/12 1200  Gross per 24 hour  Intake 1065.31 ml  Output   2850 ml  Net -1784.69 ml   Exam: General: No acute respiratory distress but somewhat anxious Lungs: Clear to auscultation bilaterally without wheezes or crackles Cardiovascular: Regular rate and rhythm without murmur gallop or rub normal S1 and  S2, no peripheral edema or JVD Abdomen: Nontender, nondistended, soft, bowel sounds positive, no  rebound, no ascites, no appreciable mass Musculoskeletal: No significant cyanosis, clubbing of bilateral lower extremities  Data Reviewed: Basic Metabolic Panel:  Recent Labs Lab 11/03/12 1210 11/04/12 0425  NA 138 137  K 4.0 3.9  CL 101 106  CO2 25 23  GLUCOSE 165* 166*  BUN 14 15  CREATININE 1.40* 1.12  CALCIUM 9.6 8.9   CBC:  Recent Labs Lab 11/03/12 1210 11/04/12 0425  WBC 9.1 10.9*  NEUTROABS 6.6  --   HGB 15.9 14.0  HCT 46.1 40.3  MCV 84.3 83.1  PLT 215 182   Cardiac Enzymes:  Recent Labs Lab 11/03/12 1210 11/03/12 1633 11/03/12 2206 11/04/12 0425  CKTOTAL  --  85 71 55  CKMB  --  2.2 2.1 2.2  TROPONINI <0.30 <0.30 <0.30 <0.30   BNP (last 3 results)  Recent Labs  05/31/12 0940  PROBNP 128.8*   CBG:  Recent Labs Lab 11/03/12 1432 11/03/12 1610  GLUCAP 127* 163*    Recent Results (from the past 240 hour(s))  MRSA PCR SCREENING     Status: None   Collection Time    11/03/12  3:57 PM      Result Value Range Status   MRSA by PCR NEGATIVE  NEGATIVE Final   Comment:            The GeneXpert MRSA Assay (FDA     approved for NASAL specimens     only), is one component of a     comprehensive MRSA colonization     surveillance program. It is not     intended to diagnose MRSA     infection nor to guide or     monitor treatment for     MRSA infections.     Studies:  Recent x-ray studies have been reviewed in detail by the Attending Physician  Scheduled Meds:  Reviewed in detail by the Attending Physician   Junious Silk, ANP [facility] Office  [phone] Pager [phone]  On-Call/Text Page:      [email or web address]      password [email or web address]  If 7PM-7AM, please contact night-coverage [email or web address] Password [email or web address] 11/04/2012, 1:13 PM   LOS: 1 day   I have personally examined this patient and reviewed the entire database. I have reviewed the above note, made any necessary editorial changes, and agree with its content.  Lonia Blood, MD [facility]

## 2012-11-04 NOTE — Progress Notes (Signed)
Utilization Review Completed.  11/04/2012  

## 2012-11-04 NOTE — Progress Notes (Signed)
4/18  Received consult for diabetes Coordinator.  Patient has been on Metformin, but stopped taking it due to possible side effect of diarrhea.  HgbA1c is 7.2%.  According to the American Diabetes Association guidelines, this value is very close to the recommended level of 7.0%.  Patient could benefit from diet education or perhaps a lower dose of Metformin than what he was on.  Financial issues are a factor in the choice of medication.  Blood sugars seem to be fairly controlled while he has been in the hospital.  If CBGs begin to rise greater than 180 mg/dl, then Novolog SENSITIVE correction scale TID could be started only while in the hospital. Will continue to follow.  Smith Mince RN BSN CDE

## 2012-11-05 MED ORDER — ALPRAZOLAM 0.5 MG PO TABS: 0.5000 mg | ORAL_TABLET | Freq: Three times a day (TID) | ORAL | Status: DC | PRN

## 2012-11-05 MED ORDER — DILTIAZEM HCL ER COATED BEADS 240 MG PO CP24: 240.0000 mg | ORAL_CAPSULE | Freq: Every day | ORAL | Status: DC

## 2012-11-05 MED ORDER — ASPIRIN 81 MG PO CHEW: 81.0000 mg | CHEWABLE_TABLET | Freq: Every day | ORAL | Status: DC

## 2012-11-05 MED ORDER — NICOTINE 21 MG/24HR TD PT24: 1.0000 | MEDICATED_PATCH | Freq: Every day | TRANSDERMAL | Status: DC

## 2012-11-05 MED ORDER — PANTOPRAZOLE SODIUM 40 MG PO TBEC: 40.0000 mg | DELAYED_RELEASE_TABLET | Freq: Every day | ORAL | Status: DC

## 2012-11-05 MED ORDER — GLIPIZIDE 2.5 MG HALF TABLET: 2.5000 mg | ORAL_TABLET | Freq: Two times a day (BID) | ORAL | Status: DC

## 2012-11-05 NOTE — Progress Notes (Signed)
Pt is to be discharged home today. Pt is in NAD, IV is out, all paperwork has been reviewed/discussed with patient, and there are no questions/concerns at this time. Assessment is unchanged from this morning. Pt is to be accompanied downstairs by staff and family via wheelchair.  

## 2012-11-05 NOTE — Progress Notes (Signed)
Brief Nutrition Note  RD consulted for nutrition education regarding diabetes.   Lab Results  Component Value Date   HGBA1C 7.2* 11/03/2012    RD provided "Carbohydrate Counting for People with Diabetes" handout from the Academy of Nutrition and Dietetics as patient was walking out the door to be discharged.  RD contact information provided.  Body mass index is 31.51 kg/(m^2). Pt meets criteria for obesity, class 1 based on current BMI.   Joaquin Courts, RD, LDN, CNSC Pager [phone] After Hours Pager [phone]

## 2012-11-05 NOTE — Discharge Summary (Signed)
Physician Discharge Summary  [name] MRN: [id number] DOB/AGE: Apr 20, 1956 57 y.o.  PCP: No primary provider on file.   Admit date: 11/03/2012 Discharge date: 11/05/2012  Discharge Diagnoses:   :   Hypertension   Diabetes mellitus type 2, uncontrolled, with cardiovascular complications   Tobacco abuse   H/O medication noncompliance   Cocaine abuse   Unstable angina   CAD (coronary artery disease), native coronary artery   Generalized anxiety disorder   Lipids abnormal     Medication List    STOP taking these medications       diltiazem 60 MG tablet  Commonly known as:  CARDIZEM      TAKE these medications       ALPRAZolam 0.5 MG tablet  Commonly known as:  XANAX  Take 1 tablet (0.5 mg total) by mouth 3 (three) times daily as needed for anxiety.     aspirin 81 MG chewable tablet  Chew 1 tablet (81 mg total) by mouth daily.     atorvastatin 20 MG tablet  Commonly known as:  LIPITOR  Take 1 tablet (20 mg total) by mouth daily at 6 PM.     clopidogrel 75 MG tablet  Commonly known as:  PLAVIX  Take 1 tablet (75 mg total) by mouth daily with breakfast.     diltiazem 240 MG 24 hr capsule  Commonly known as:  CARDIZEM CD  Take 1 capsule (240 mg total) by mouth daily.     glipiZIDE 2.5 mg Tabs  Commonly known as:  GLUCOTROL  Take 0.5 tablets (2.5 mg total) by mouth 2 (two) times daily before a meal.     isosorbide mononitrate 60 MG 24 hr tablet  Commonly known as:  IMDUR  Take 1 tablet (30 mg total) by mouth daily.     lisinopril 2.5 MG tablet  Commonly known as:  PRINIVIL,ZESTRIL  Take 1 tablet (2.5 mg total) by mouth daily.     nicotine 21 mg/24hr patch  Commonly known as:  NICODERM CQ - dosed in mg/24 hours  Place 1 patch onto the skin daily.     nitroGLYCERIN 0.4 MG SL tablet  Commonly known as:  NITROSTAT  Place 1 tablet (0.4 mg total) under the tongue every 5 (five) minutes as needed for chest pain.     pantoprazole 40 MG tablet   Commonly known as:  PROTONIX  Take 1 tablet (40 mg total) by mouth daily.        Discharge Condition: stable   Disposition: home    Consults: Cassell Clement, MD Nehemiah Settle, MD    Significant Diagnostic Studies: Dg Chest 2 View  11/03/2012  *RADIOLOGY REPORT*  Clinical Data: Intermittent chest pain for 5 months, more constant today.  CHEST - 2 VIEW  Comparison: 06/01/2012 and 05/31/2012.  Findings: The heart size and mediastinal contours are stable. There is new central interstitial prominence which may reflect central airway thickening.  There is no edema, airspace disease or significant pleural effusion.  IMPRESSION: Suspected mild central airway thickening suggesting bronchitis.  No evidence of pneumonia or edema.   Original Report Authenticated By: Carey Bullocks, M.D.      Microbiology: Recent Results (from the past 240 hour(s))  MRSA PCR SCREENING     Status: None   Collection Time    11/03/12  3:57 PM      Result Value Range Status   MRSA by PCR NEGATIVE  NEGATIVE Final   Comment:  The GeneXpert MRSA Assay (FDA     approved for NASAL specimens     only), is one component of a     comprehensive MRSA colonization     surveillance program. It is not     intended to diagnose MRSA     infection nor to guide or     monitor treatment for     MRSA infections.     Labs: Results for orders placed during the hospital encounter of 11/03/12 (from the past 48 hour(s))  CBC WITH DIFFERENTIAL     Status: None   Collection Time    11/03/12 12:10 PM      Result Value Range   WBC 9.1  4.0 - 10.5 K/uL   RBC 5.47  4.22 - 5.81 MIL/uL   Hemoglobin 15.9  13.0 - 17.0 g/dL   HCT 78.2  95.6 - 21.3 %   MCV 84.3  78.0 - 100.0 fL   MCH 29.1  26.0 - 34.0 pg   MCHC 34.5  30.0 - 36.0 g/dL   RDW 08.6  57.8 - 46.9 %   Platelets 215  150 - 400 K/uL   Neutrophils Relative 72  43 - 77 %   Neutro Abs 6.6  1.7 - 7.7 K/uL   Lymphocytes Relative 21  12 - 46 %    Lymphs Abs 1.9  0.7 - 4.0 K/uL   Monocytes Relative 6  3 - 12 %   Monocytes Absolute 0.5  0.1 - 1.0 K/uL   Eosinophils Relative 2  0 - 5 %   Eosinophils Absolute 0.1  0.0 - 0.7 K/uL   Basophils Relative 0  0 - 1 %   Basophils Absolute 0.0  0.0 - 0.1 K/uL  BASIC METABOLIC PANEL     Status: Abnormal   Collection Time    11/03/12 12:10 PM      Result Value Range   Sodium 138  135 - 145 mEq/L   Potassium 4.0  3.5 - 5.1 mEq/L   Chloride 101  96 - 112 mEq/L   CO2 25  19 - 32 mEq/L   Glucose, Bld 165 (*) 70 - 99 mg/dL   BUN 14  6 - 23 mg/dL   Creatinine, Ser 6.29 (*) 0.50 - 1.35 mg/dL   Calcium 9.6  8.4 - 52.8 mg/dL   GFR calc non Af Amer 54 (*) >90 mL/min   GFR calc Af Amer 63 (*) >90 mL/min   Comment:            The eGFR has been calculated     using the CKD EPI equation.     This calculation has not been     validated in all clinical     situations.     eGFR's persistently     <90 mL/min signify     possible Chronic Kidney Disease.  TROPONIN I     Status: None   Collection Time    11/03/12 12:10 PM      Result Value Range   Troponin I <0.30  <0.30 ng/mL   Comment:            Due to the release kinetics of cTnI,     a negative result within the first hours     of the onset of symptoms does not rule out     myocardial infarction with certainty.     If myocardial infarction is still suspected,     repeat the test at appropriate  intervals.  URINE RAPID DRUG SCREEN (HOSP PERFORMED)     Status: Abnormal   Collection Time    11/03/12 12:20 PM      Result Value Range   Opiates NONE DETECTED  NONE DETECTED   Cocaine POSITIVE (*) NONE DETECTED   Benzodiazepines NONE DETECTED  NONE DETECTED   Amphetamines NONE DETECTED  NONE DETECTED   Tetrahydrocannabinol NONE DETECTED  NONE DETECTED   Barbiturates NONE DETECTED  NONE DETECTED   Comment:            DRUG SCREEN FOR MEDICAL PURPOSES     ONLY.  IF CONFIRMATION IS NEEDED     FOR ANY PURPOSE, NOTIFY LAB     WITHIN 5 DAYS.                 LOWEST DETECTABLE LIMITS     FOR URINE DRUG SCREEN     Drug Class       Cutoff (ng/mL)     Amphetamine      1000     Barbiturate      200     Benzodiazepine   200     Tricyclics       300     Opiates          300     Cocaine          300     THC              50  GLUCOSE, CAPILLARY     Status: Abnormal   Collection Time    11/03/12  2:32 PM      Result Value Range   Glucose-Capillary 127 (*) 70 - 99 mg/dL   Comment 1 Documented in Chart     Comment 2 Notify RN    MRSA PCR SCREENING     Status: None   Collection Time    11/03/12  3:57 PM      Result Value Range   MRSA by PCR NEGATIVE  NEGATIVE   Comment:            The GeneXpert MRSA Assay (FDA     approved for NASAL specimens     only), is one component of a     comprehensive MRSA colonization     surveillance program. It is not     intended to diagnose MRSA     infection nor to guide or     monitor treatment for     MRSA infections.  GLUCOSE, CAPILLARY     Status: Abnormal   Collection Time    11/03/12  4:10 PM      Result Value Range   Glucose-Capillary 163 (*) 70 - 99 mg/dL  TROPONIN I     Status: None   Collection Time    11/03/12  4:33 PM      Result Value Range   Troponin I <0.30  <0.30 ng/mL   Comment:            Due to the release kinetics of cTnI,     a negative result within the first hours     of the onset of symptoms does not rule out     myocardial infarction with certainty.     If myocardial infarction is still suspected,     repeat the test at appropriate intervals.  CK TOTAL AND CKMB     Status: None   Collection Time    11/03/12  4:33 PM  Result Value Range   Total CK 85  7 - 232 U/L   CK, MB 2.2  0.3 - 4.0 ng/mL   Relative Index RELATIVE INDEX IS INVALID  0.0 - 2.5   Comment: WHEN CK < 100 U/L             HEMOGLOBIN A1C     Status: Abnormal   Collection Time    11/03/12  4:48 PM      Result Value Range   Hemoglobin A1C 7.2 (*) <5.7 %   Comment: (NOTE)                                                                                According to the ADA Clinical Practice Recommendations for 2011, when     HbA1c is used as a screening test:      >=6.5%   Diagnostic of Diabetes Mellitus               (if abnormal result is confirmed)     5.7-6.4%   Increased risk of developing Diabetes Mellitus     References:Diagnosis and Classification of Diabetes Mellitus,Diabetes     Care,2011,34(Suppl 1):S62-S69 and Standards of Medical Care in             Diabetes - 2011,Diabetes Care,2011,34 (Suppl 1):S11-S61.     CORRECTED ON 04/17 AT 2346: PREVIOUSLY REPORTED AS 7.2 Reference range: <5.7   Mean Plasma Glucose 160 (*) <117 mg/dL  HEPARIN LEVEL (UNFRACTIONATED)     Status: Abnormal   Collection Time    11/03/12 10:02 PM      Result Value Range   Heparin Unfractionated <0.10 (*) 0.30 - 0.70 IU/mL   Comment:            IF HEPARIN RESULTS ARE BELOW     EXPECTED VALUES, AND PATIENT     DOSAGE HAS BEEN CONFIRMED,     SUGGEST FOLLOW UP TESTING     OF ANTITHROMBIN III LEVELS.  TROPONIN I     Status: None   Collection Time    11/03/12 10:06 PM      Result Value Range   Troponin I <0.30  <0.30 ng/mL   Comment:            Due to the release kinetics of cTnI,     a negative result within the first hours     of the onset of symptoms does not rule out     myocardial infarction with certainty.     If myocardial infarction is still suspected,     repeat the test at appropriate intervals.  CK TOTAL AND CKMB     Status: None   Collection Time    11/03/12 10:06 PM      Result Value Range   Total CK 71  7 - 232 U/L   CK, MB 2.1  0.3 - 4.0 ng/mL   Relative Index RELATIVE INDEX IS INVALID  0.0 - 2.5   Comment: WHEN CK < 100 U/L             TROPONIN I     Status: None   Collection Time    11/04/12  4:25 AM  Result Value Range   Troponin I <0.30  <0.30 ng/mL   Comment:            Due to the release kinetics of cTnI,     a negative result within the first hours     of the  onset of symptoms does not rule out     myocardial infarction with certainty.     If myocardial infarction is still suspected,     repeat the test at appropriate intervals.  CK TOTAL AND CKMB     Status: None   Collection Time    11/04/12  4:25 AM      Result Value Range   Total CK 55  7 - 232 U/L   CK, MB 2.2  0.3 - 4.0 ng/mL   Relative Index RELATIVE INDEX IS INVALID  0.0 - 2.5   Comment: WHEN CK < 100 U/L             CBC     Status: Abnormal   Collection Time    11/04/12  4:25 AM      Result Value Range   WBC 10.9 (*) 4.0 - 10.5 K/uL   RBC 4.85  4.22 - 5.81 MIL/uL   Hemoglobin 14.0  13.0 - 17.0 g/dL   HCT 82.9  56.2 - 13.0 %   MCV 83.1  78.0 - 100.0 fL   MCH 28.9  26.0 - 34.0 pg   MCHC 34.7  30.0 - 36.0 g/dL   RDW 86.5  78.4 - 69.6 %   Platelets 182  150 - 400 K/uL  BASIC METABOLIC PANEL     Status: Abnormal   Collection Time    11/04/12  4:25 AM      Result Value Range   Sodium 137  135 - 145 mEq/L   Potassium 3.9  3.5 - 5.1 mEq/L   Chloride 106  96 - 112 mEq/L   CO2 23  19 - 32 mEq/L   Glucose, Bld 166 (*) 70 - 99 mg/dL   BUN 15  6 - 23 mg/dL   Creatinine, Ser 2.95  0.50 - 1.35 mg/dL   Calcium 8.9  8.4 - 28.4 mg/dL   GFR calc non Af Amer 71 (*) >90 mL/min   GFR calc Af Amer 82 (*) >90 mL/min   Comment:            The eGFR has been calculated     using the CKD EPI equation.     This calculation has not been     validated in all clinical     situations.     eGFR's persistently     <90 mL/min signify     possible Chronic Kidney Disease.  HEPARIN LEVEL (UNFRACTIONATED)     Status: None   Collection Time    11/04/12  4:25 AM      Result Value Range   Heparin Unfractionated 0.32  0.30 - 0.70 IU/mL   Comment:            IF HEPARIN RESULTS ARE BELOW     EXPECTED VALUES, AND PATIENT     DOSAGE HAS BEEN CONFIRMED,     SUGGEST FOLLOW UP TESTING     OF ANTITHROMBIN III LEVELS.  LIPID PANEL     Status: Abnormal   Collection Time    11/04/12  4:25 AM      Result  Value Range   Cholesterol 115  0 - 200 mg/dL   Triglycerides  234 (*) <150 mg/dL   HDL 23 (*) >46 mg/dL   Total CHOL/HDL Ratio 5.0     VLDL 47 (*) 0 - 40 mg/dL   LDL Cholesterol 45  0 - 99 mg/dL   Comment:            Total Cholesterol/HDL:CHD Risk     Coronary Heart Disease Risk Table                         Men   Women      1/2 Average Risk   3.4   3.3      Average Risk       5.0   4.4      2 X Average Risk   9.6   7.1      3 X Average Risk  23.4   11.0                Use the calculated Patient Ratio     above and the CHD Risk Table     to determine the patient's CHD Risk.                ATP III CLASSIFICATION (LDL):      <100     mg/dL   Optimal      962-952  mg/dL   Near or Above                        Optimal      130-159  mg/dL   Borderline      841-324  mg/dL   High      >401     mg/dL   Very High     HPI :  57 y.o. [name] with a history of CAD. He came in last November and a cath showed CAD. Enzymes were negative. Plan was for stabilization and consider PCI but pt left AMA and has not followed up since then. He has had chest pain frequently, recently nearly every day. He has had pain with and without exertion, his usual angina. He uses SL NTG for relief, with success. He was  wakened by [facility] at 3 am. He took 3 SL NTG with relief but the pain returned later this am. He took more NTG but came to the ER. In the ER, he was admitted, started on IV NTG and heparin,  He tested positive for cocaine but says has not done the drug since Sunday. He says he has been compliant with Plavix. Currently, he is resting comfortably.    HOSPITAL COURSE:   Unstable angina - treated with nitrates and heparin.   There is concern for compliance with meds/abstinence from drugs/tobacco. Currently, his EF is preserved and enzymes are negative. If he is stented and has stent closure, he will be negatively affected. Therefore, the best option is to maximize medical therapy, follow him as an outpatient,  and if he continues to have symptoms when he is compliant with a treatment regimen, consider PCI at that time.  resume baby ASA which he took at home. Continue Plavix   Cocaine abuse  -admits monthly use - wishes to NOT disclose his use to family  -had been clean until 2006  Patient does not meet criteria of acute psych hospitalization and will be referred to substance rehab services. [facility] services may be better option  Hypertension  Diltiazem changed to CD once a day dosing ,also  increased imdur    Diabetes mellitus type 2, uncontrolled, with cardiovascular complications  -not on meds pre admit due to issues with Metformin induced diarrhea  -A1c 7.2  Switched to glipizide    H/O medication noncompliance  Stable    Tobacco abuse  Generalized anxiety disorder  -psych says no indication for IP treatment  -prn Xanax -20 tabs provided   Lipids abnormal  -resume Lipitor-       Discharge Exam:   Blood pressure 143/78, pulse 78, temperature 98 F (36.7 C), temperature source Oral, resp. rate 18, height 6' (1.829 m), weight 105.4 kg (232 lb 5.8 oz), SpO2 94.00%. General: Well developed, well nourished, [name] in no acute distress  Head: Eyes PERRLA, No xanthomas. Normocephalic and atraumatic, oropharynx without edema or exudate. Dentition:  Lungs:  Heart: HRRR S1 S2, no rub/gallop, Heart irregular rate and rhythm with S1, S2 murmur. pulses are 2+ extrem.  Neck: No carotid bruits. No lymphadenopathy. JVD.  Abdomen: Bowel sounds present, abdomen soft and non-tender without masses or hernias noted.  Msk: No spine or cva tenderness. No weakness, no joint deformities or effusions.  Extremities: No clubbing or cyanosis. edema.  Neuro: Alert and oriented X 3. No focal deficits noted         Discharge Orders   Future Orders Complete By Expires     Diet - low sodium heart healthy  As directed     Increase activity slowly  As directed        Follow-up Information    Follow up with Primary care provider. Schedule an appointment as soon as possible for a visit in 1 week.      SignedRicharda Overlie 11/05/2012, 10:07 AM

## 2013-02-14 ENCOUNTER — Telehealth (HOSPITAL_COMMUNITY): Payer: Self-pay | Admitting: Dietician

## 2013-02-14 NOTE — Telephone Encounter (Signed)
Received voicemail from pt left at [phone]. Pt wants to register for DM class; pt of [facility].

## 2013-02-14 NOTE — Telephone Encounter (Signed)
Called back at 1341. Pt registered for 02/21/13 DM class at 1000.

## 2013-02-21 ENCOUNTER — Encounter (HOSPITAL_COMMUNITY): Payer: Self-pay | Admitting: Dietician

## 2013-02-21 NOTE — Progress Notes (Signed)
[facility] Diabetes Class Completion  Date:February 21, 2013  Time:  Pt attended [facility]'s Diabetes Group Education Class on February 21, 2013.   Patient was educated on the following topics:   -Survival skills (signs and symptoms of hyperglycemia and hypoglycemia, treatment for hypoglycemia, ideal levels for fasting and postprandial blood sugars, goal Hgb A1c level, foot care basics)  -Recommendations for physical activity   -Carbohydrate metabolism in relation to diabetes   -Meal planning (sources of carbohydrate, carbohydrate counting, meal planning strategies, food label reading, and portion control).  Handouts provided:  -"Diabetes and You: Taking Charge of Your Health"  -"Carbohydrate Counting and Meal Planning"  -"Blood Sugar Diary"  -"Your Guide to Better Office Visits"   Kavin Weckwerth A. Mayford Knife, RD, LDN

## 2013-03-07 ENCOUNTER — Other Ambulatory Visit: Payer: Self-pay | Admitting: Cardiology

## 2013-03-07 ENCOUNTER — Encounter: Payer: Self-pay | Admitting: Cardiology

## 2013-03-07 ENCOUNTER — Ambulatory Visit (INDEPENDENT_AMBULATORY_CARE_PROVIDER_SITE_OTHER): Payer: Self-pay | Admitting: Cardiology

## 2013-03-07 ENCOUNTER — Encounter: Payer: Self-pay | Admitting: *Deleted

## 2013-03-07 VITALS — BP 124/77 | HR 82 | Ht 69.0 in | Wt 237.0 lb

## 2013-03-07 DIAGNOSIS — I10 Essential (primary) hypertension: Secondary | ICD-10-CM

## 2013-03-07 DIAGNOSIS — E789 Disorder of lipoprotein metabolism, unspecified: Secondary | ICD-10-CM

## 2013-03-07 DIAGNOSIS — E1165 Type 2 diabetes mellitus with hyperglycemia: Secondary | ICD-10-CM

## 2013-03-07 DIAGNOSIS — F1410 Cocaine abuse, uncomplicated: Secondary | ICD-10-CM

## 2013-03-07 DIAGNOSIS — E7889 Other lipoprotein metabolism disorders: Secondary | ICD-10-CM

## 2013-03-07 DIAGNOSIS — Z01818 Encounter for other preprocedural examination: Secondary | ICD-10-CM

## 2013-03-07 DIAGNOSIS — I2510 Atherosclerotic heart disease of native coronary artery without angina pectoris: Secondary | ICD-10-CM

## 2013-03-07 MED ORDER — ISOSORBIDE MONONITRATE ER 30 MG PO TB24: 30.0000 mg | ORAL_TABLET | Freq: Two times a day (BID) | ORAL | Status: DC

## 2013-03-07 NOTE — Assessment & Plan Note (Signed)
Blood pressure control is good today. 

## 2013-03-07 NOTE — Addendum Note (Signed)
Addended by: Derry Lory A on: 03/07/2013 09:48 AM   Modules accepted: Orders

## 2013-03-07 NOTE — Progress Notes (Signed)
Clinical Summary [name] is a 57 y.o.male referred to the office for evaluation by Ms. McElroy PA-C at the [facility]. Patient has already been evaluated by our practice with history of recurring chest pain in the setting of noncompliance and cocaine abuse, and documented CAD by catheterization. He has mild to moderate diffuse disease in the major epicardials with branch vessel disease including a small diagonal as well as long area of segmental stenosis within a subbranch of the second obtuse marginal documented in November of last year. At that time he left the hospital AMA. He was reevaluated in April of this year as an inpatient with recurring chest pain, ruled out for an infarct at that time, and was managed medically with continued concerns about noncompliance and drug abuse affecting optimal treatment with revascularization.  Lab work done recently showed hemoglobin 15.2, platelets 247, potassium 4.5, BUN 13, creatine 1.0, cholesterol 157, triglycerides 211, HDL 29, LDL 86. ECGs reviewed including one from May showing low voltage in the limb leads with sinus rhythm and no acute ST segment changes.  [name] tells me that he has not used any cocaine since April of this year, insists that he has been taking his medications regularly, outlined below. He continues to have daily angina symptoms requiring nitroglycerin.   No Known Allergies  Current Outpatient Prescriptions  Medication Sig Dispense Refill  . aspirin 81 MG chewable tablet Chew 1 tablet (81 mg total) by mouth daily.  30 tablet  2  . clopidogrel (PLAVIX) 75 MG tablet Take 1 tablet (75 mg total) by mouth daily with breakfast.  30 tablet  5  . diltiazem (CARDIZEM CD) 240 MG 24 hr capsule Take 1 capsule (240 mg total) by mouth daily.  30 capsule  2  . esomeprazole (NEXIUM) 40 MG capsule Take 40 mg by mouth daily before breakfast.      . fish oil-omega-3 fatty acids 1000 MG capsule Take 1 g by mouth 4 (four) times  daily.      [name] glipiZIDE (GLUCOTROL) 2.5 mg TABS Take 0.5 tablets (2.5 mg total) by mouth 2 (two) times daily before a meal.  60 tablet  0  . isosorbide mononitrate (IMDUR) 30 MG 24 hr tablet Take 1 tablet (30 mg total) by mouth daily.  30 tablet  3  . nitroGLYCERIN (NITROSTAT) 0.4 MG SL tablet Place 1 tablet (0.4 mg total) under the tongue every 5 (five) minutes as needed for chest pain.  30 tablet  1  . quinapril (ACCUPRIL) 5 MG tablet Take 5 mg by mouth at bedtime.      . rosuvastatin (CRESTOR) 20 MG tablet Take 20 mg by mouth daily.       No current facility-administered medications for this visit.    Past Medical History  Diagnosis Date  . Essential hypertension, benign   . TIA (transient ischemic attack) 1997    No residual neurological deficits.  . Pneumonia   . Type 2 diabetes mellitus 2011  . Hepatitis Late 1970s    Treated in the hospital, finished treatment course. No recurrence  since  . Noncompliance   . Coronary atherosclerosis of native coronary artery     Past Surgical History  Procedure Laterality Date  . Appendectomy      Family History  Problem Relation Age of Onset  . Hypertension    . Diabetes    . Stroke    . Cancer Father     Lung  . Cancer Mother  Thyroid  . Cancer Maternal Grandmother     Breast  . Cancer Maternal Grandfather     Throat and stomach  . CAD Father   . CAD Brother     Social History [name] reports that he has been smoking Cigarettes.  He has been smoking about 0.00 packs per day. He does not have any smokeless tobacco history on file. [name] reports that he does not drink alcohol.  Review of Systems No palpitations or syncope. No spontaneous bleeding. Stable appetite. No fevers or chills. No orthopnea or PND. It was negative.  Physical Examination Filed Vitals:   03/07/13 0852  BP: 124/77  Pulse: 82   Filed Weights   03/07/13 0852  Weight: 237 lb (107.502 kg)   Overweight..Appears comfortable at  rest. HEENT: Conjunctiva and lids normal, oropharynx clear. Neck: Supple, no elevated JVP or carotid bruits, no thyromegaly. Lungs: Diminished but clear to auscultation, nonlabored breathing at rest. Cardiac: Regular rate and rhythm, no S3, 2/6 systolic murmur, no pericardial rub. Abdomen: Soft, nontender, bowel sounds present, no guarding or rebound. Extremities: No pitting edema, distal pulses 2+. Skin: Warm and dry. Musculoskeletal: No kyphosis. Neuropsychiatric: Alert and oriented x3, affect grossly appropriate.   Problem List and Plan   CAD (coronary artery disease), native coronary artery Progressive angina symptoms as outlined. Patient has documented CAD, revascularization has not been pursued with previously discussed concerns about compliance and also cocaine use. [name] tells me that he has not used any cocaine since April and has been taking his medicines regularly. Despite this he continues to have accelerating angina. I have reviewed the records and see that Dr. Excell [name] reviewed the patient's films at the last admission. Plan at this time is to increase Imdur to 30 mg twice daily. I also plan to speak with Dr. Excell [name] today and likely schedule the patient for a repeat cardiac catheterization with tentative PCI this week if appropriate.  Cocaine abuse Patient states that he has not used any cocaine since April.  Essential hypertension, benign Blood pressure control is good today.  Diabetes mellitus type 2, uncontrolled, with cardiovascular complications Now followed at the [facility] in [place].  Lipids abnormal Recent LDL 86 on statin therapy.    Jonelle Sidle, M.D., F.A.C.C.

## 2013-03-07 NOTE — Patient Instructions (Addendum)
Your physician recommends that you schedule a follow-up appointment in: POST CATH PROCEDURE  Your physician has recommended you make the following change in your medication:   1) INCREASE IMDUR TO 30MG  IN THE AM AND 30MG  IN THE PM  Your physician has requested that you have a cardiac catheterization. Cardiac catheterization is used to diagnose and/or treat various heart conditions. Doctors may recommend this procedure for a number of different reasons. The most common reason is to evaluate chest pain. Chest pain can be a symptom of coronary artery disease (CAD), and cardiac catheterization can show whether plaque is narrowing or blocking your heart's arteries. This procedure is also used to evaluate the valves, as well as measure the blood flow and oxygen levels in different parts of your heart. For further information please visit [email or web address] Please follow instruction sheet, as given.ON 03-09-13 AT 5:30AM  Your physician recommends that you return for lab work in: TODAY (SLIPS GIVEN FOR PT/INR)

## 2013-03-07 NOTE — Assessment & Plan Note (Signed)
Progressive angina symptoms as outlined. Patient has documented CAD, revascularization has not been pursued with previously discussed concerns about compliance and also cocaine use. [name] tells me that he has not used any cocaine since April and has been taking his medicines regularly. Despite this he continues to have accelerating angina. I have reviewed the records and see that Dr. Excell Seltzer reviewed the patient's films at the last admission. Plan at this time is to increase Imdur to 30 mg twice daily. I also plan to speak with Dr. Excell Seltzer today and likely schedule the patient for a repeat cardiac catheterization with tentative PCI this week if appropriate.

## 2013-03-07 NOTE — Assessment & Plan Note (Signed)
Now followed at the [facility] in [place].

## 2013-03-07 NOTE — Assessment & Plan Note (Signed)
Patient states that he has not used any cocaine since April.

## 2013-03-07 NOTE — Assessment & Plan Note (Signed)
Recent LDL 86 on statin therapy.

## 2013-03-08 ENCOUNTER — Encounter (HOSPITAL_COMMUNITY): Payer: Self-pay | Admitting: *Deleted

## 2013-03-08 ENCOUNTER — Emergency Department (HOSPITAL_COMMUNITY): Payer: Self-pay

## 2013-03-08 ENCOUNTER — Inpatient Hospital Stay (HOSPITAL_COMMUNITY)
Admission: EM | Admit: 2013-03-08 | Discharge: 2013-03-10 | DRG: 247 | Disposition: A | Discharge status: Home / Self Care | Payer: MEDICAID | Attending: Cardiovascular Disease | Admitting: Cardiovascular Disease

## 2013-03-08 DIAGNOSIS — Z803 Family history of malignant neoplasm of breast: Secondary | ICD-10-CM

## 2013-03-08 DIAGNOSIS — Z7902 Long term (current) use of antithrombotics/antiplatelets: Secondary | ICD-10-CM

## 2013-03-08 DIAGNOSIS — E119 Type 2 diabetes mellitus without complications: Secondary | ICD-10-CM | POA: Diagnosis present

## 2013-03-08 DIAGNOSIS — R55 Syncope and collapse: Secondary | ICD-10-CM

## 2013-03-08 DIAGNOSIS — J449 Chronic obstructive pulmonary disease, unspecified: Secondary | ICD-10-CM | POA: Diagnosis present

## 2013-03-08 DIAGNOSIS — Z8673 Personal history of transient ischemic attack (TIA), and cerebral infarction without residual deficits: Secondary | ICD-10-CM

## 2013-03-08 DIAGNOSIS — F1410 Cocaine abuse, uncomplicated: Secondary | ICD-10-CM

## 2013-03-08 DIAGNOSIS — Z808 Family history of malignant neoplasm of other organs or systems: Secondary | ICD-10-CM

## 2013-03-08 DIAGNOSIS — IMO0002 Reserved for concepts with insufficient information to code with codable children: Secondary | ICD-10-CM

## 2013-03-08 DIAGNOSIS — I2510 Atherosclerotic heart disease of native coronary artery without angina pectoris: Principal | ICD-10-CM

## 2013-03-08 DIAGNOSIS — R001 Bradycardia, unspecified: Secondary | ICD-10-CM

## 2013-03-08 DIAGNOSIS — Z9119 Patient's noncompliance with other medical treatment and regimen: Secondary | ICD-10-CM

## 2013-03-08 DIAGNOSIS — Z91199 Patient's noncompliance with other medical treatment and regimen due to unspecified reason: Secondary | ICD-10-CM

## 2013-03-08 DIAGNOSIS — Z8249 Family history of ischemic heart disease and other diseases of the circulatory system: Secondary | ICD-10-CM

## 2013-03-08 DIAGNOSIS — Z7982 Long term (current) use of aspirin: Secondary | ICD-10-CM

## 2013-03-08 DIAGNOSIS — E7889 Other lipoprotein metabolism disorders: Secondary | ICD-10-CM

## 2013-03-08 DIAGNOSIS — E785 Hyperlipidemia, unspecified: Secondary | ICD-10-CM | POA: Diagnosis present

## 2013-03-08 DIAGNOSIS — R079 Chest pain, unspecified: Secondary | ICD-10-CM

## 2013-03-08 DIAGNOSIS — Z79899 Other long term (current) drug therapy: Secondary | ICD-10-CM

## 2013-03-08 DIAGNOSIS — E1165 Type 2 diabetes mellitus with hyperglycemia: Secondary | ICD-10-CM

## 2013-03-08 DIAGNOSIS — I498 Other specified cardiac arrhythmias: Secondary | ICD-10-CM | POA: Diagnosis present

## 2013-03-08 DIAGNOSIS — Z9089 Acquired absence of other organs: Secondary | ICD-10-CM

## 2013-03-08 DIAGNOSIS — I200 Unstable angina: Secondary | ICD-10-CM

## 2013-03-08 DIAGNOSIS — F411 Generalized anxiety disorder: Secondary | ICD-10-CM

## 2013-03-08 DIAGNOSIS — Z801 Family history of malignant neoplasm of trachea, bronchus and lung: Secondary | ICD-10-CM

## 2013-03-08 DIAGNOSIS — F1910 Other psychoactive substance abuse, uncomplicated: Secondary | ICD-10-CM | POA: Diagnosis present

## 2013-03-08 DIAGNOSIS — Z955 Presence of coronary angioplasty implant and graft: Secondary | ICD-10-CM

## 2013-03-08 DIAGNOSIS — Z720 Tobacco use: Secondary | ICD-10-CM

## 2013-03-08 DIAGNOSIS — Z800 Family history of malignant neoplasm of digestive organs: Secondary | ICD-10-CM

## 2013-03-08 DIAGNOSIS — F17200 Nicotine dependence, unspecified, uncomplicated: Secondary | ICD-10-CM | POA: Diagnosis present

## 2013-03-08 DIAGNOSIS — J4489 Other specified chronic obstructive pulmonary disease: Secondary | ICD-10-CM | POA: Diagnosis present

## 2013-03-08 DIAGNOSIS — I10 Essential (primary) hypertension: Secondary | ICD-10-CM | POA: Diagnosis present

## 2013-03-08 HISTORY — DX: Chronic obstructive pulmonary disease, unspecified: J44.9

## 2013-03-08 HISTORY — DX: Hyperlipidemia, unspecified: E78.5

## 2013-03-08 HISTORY — DX: Personal history of pneumonia (recurrent): Z87.01

## 2013-03-08 HISTORY — DX: Unspecified osteoarthritis, unspecified site: M19.90

## 2013-03-08 HISTORY — DX: Gastro-esophageal reflux disease without esophagitis: K21.9

## 2013-03-08 LAB — CBC WITH DIFFERENTIAL/PLATELET
Basophils Absolute: 0 10*3/uL (ref 0.0–0.1)
Basophils Relative: 0 % (ref 0–1)
Hemoglobin: 13.9 g/dL (ref 13.0–17.0)
MCHC: 35.1 g/dL (ref 30.0–36.0)
Monocytes Relative: 7 % (ref 3–12)
Neutro Abs: 7.7 10*3/uL (ref 1.7–7.7)
Neutrophils Relative %: 70 % (ref 43–77)
RBC: 4.78 MIL/uL (ref 4.22–5.81)
WBC: 10.9 10*3/uL — ABNORMAL HIGH (ref 4.0–10.5)

## 2013-03-08 LAB — PROTIME-INR
INR: 1.03 (ref 0.00–1.49)
Prothrombin Time: 13.3 seconds (ref 11.6–15.2)

## 2013-03-08 LAB — POCT I-STAT, CHEM 8
BUN: 14 mg/dL (ref 6–23)
Chloride: 107 mEq/L (ref 96–112)
Creatinine, Ser: 1.3 mg/dL (ref 0.50–1.35)
Potassium: 4.2 mEq/L (ref 3.5–5.1)
Sodium: 140 mEq/L (ref 135–145)

## 2013-03-08 LAB — POCT I-STAT TROPONIN I

## 2013-03-08 MED ORDER — SODIUM CHLORIDE 0.9 % IV SOLN
Freq: Once | INTRAVENOUS | Status: AC
  Administered 2013-03-08: 1000 mL via INTRAVENOUS

## 2013-03-08 NOTE — ED Provider Notes (Signed)
57 year old male with a history of documented coronary disease as well as documented cocaine abuse who presents with a complaint of chest pain and syncopal episodes associated with bradycardia. This wasn't documented clinically by the paramedics however they were not able to document any bradycardic rhythms on her monitoring.  The symptoms are persistent, feels like a heaviness on his chest on exam he has clear heart and lung sounds, does not appear to be in any distress, abdomen is obese and minimally tender in the upper abdomen, no peripheral edema.  His EKG shows diffuse low voltage but no other acute findings. He has been given aspirin in route by the paramedics, he will remain on cardiac monitoring, he'll need to have EKG, admission to the hospital. He was supposed to have a cardiology catheterization in the morning plus admission tonight is reasonable as he does appear to be possibly critically ill as he is too having sinus bradycardic rhythms.  I saw and evaluated the patient, reviewed the resident's note and I agree with the findings and plan.  I personally interpreted the EKG as well as the resident and agree with the interpretation on the resident's chart.  Diagnosis: Syncope   Vida Roller, MD 03/13/13 [phone]

## 2013-03-08 NOTE — H&P (Signed)
Cardiology H&P  Primary Care Povider: Alda Lea Primary Cardiologist: Drs. Diona Browner and Excell Seltzer   HPI: [name] is a 57 y.o.male with hx below relevant for prior substance abuse, chronic ischemic heart disease who presented to the ED this evening with chest pain. Patient was seen yesterday by Dr. Diona Browner and was set to have LHC tomorrow AM. He presented to the ED this evening because he was having signfiicant episodes of chest pain while at rest. Episode began while he was sitting on the couch. His mother called 911 and he was brought via EMS to ED. Per ED, EMS witnessed 2 syncopal episodes where his heart rate dropped briefly into the 30s. The patient does not recall this. On my interview, patient was comfortable with normal hemodynamics. ECG was largely unremarkable and showed no evidence of conduction disease or myocardial ischemia.   He has already been evaluated by [facility] with history of recurring chest pain in the setting of noncompliance and cocaine abuse, and documented CAD by catheterization. He has mild to moderate diffuse disease in the major epicardials with branch vessel disease including a small diagonal as well as long area of segmental stenosis within a subbranch of the second obtuse marginal documented in November of last year. At that time he left the hospital AMA. He was reevaluated in April of this year as an inpatient with recurring chest pain, ruled out for an infarct at that time, and was managed medically with continued concerns about noncompliance and drug abuse affecting optimal treatment with revascularization.  Mr. Freese states he has not used any cocaine since Mayof this year, insists that he has been taking his medications regularly, outlined below. He continues to have daily angina symptoms requiring nitroglycerin.    Past Medical History  Diagnosis Date  . Essential hypertension, benign   . TIA (transient ischemic attack) 1997    No residual  neurological deficits.  . Pneumonia   . Type 2 diabetes mellitus 2011  . Hepatitis Late 1970s    Treated in the hospital, finished treatment course. No recurrence  since  . Noncompliance   . Coronary atherosclerosis of native coronary artery   . COPD (chronic obstructive pulmonary disease)     Past Surgical History  Procedure Laterality Date  . Appendectomy      Family History  Problem Relation Age of Onset  . Hypertension    . Diabetes    . Stroke    . Cancer Father     Lung  . Cancer Mother     Thyroid  . Cancer Maternal Grandmother     Breast  . Cancer Maternal Grandfather     Throat and stomach  . CAD Father   . CAD Brother     Social History:  reports that he has been smoking Cigarettes.  He has been smoking about 0.00 packs per day. He does not have any smokeless tobacco history on file. He reports that he uses illicit drugs (Cocaine). He reports that he does not drink alcohol.  Allergies: No Known Allergies  No current facility-administered medications for this encounter.   Current Outpatient Prescriptions  Medication Sig Dispense Refill  . aspirin 81 MG chewable tablet Chew 1 tablet (81 mg total) by mouth daily.  30 tablet  2  . clopidogrel (PLAVIX) 75 MG tablet Take 1 tablet (75 mg total) by mouth daily with breakfast.  30 tablet  5  . diltiazem (CARDIZEM CD) 240 MG 24 hr capsule Take 1 capsule (  240 mg total) by mouth daily.  30 capsule  2  . esomeprazole (NEXIUM) 40 MG capsule Take 40 mg by mouth daily before breakfast.      . fish oil-omega-3 fatty acids 1000 MG capsule Take 1 g by mouth 4 (four) times daily.      [name] glipiZIDE (GLUCOTROL) 2.5 mg TABS Take 0.5 tablets (2.5 mg total) by mouth 2 (two) times daily before a meal.  60 tablet  0  . isosorbide mononitrate (IMDUR) 30 MG 24 hr tablet Take 1 tablet (30 mg total) by mouth 2 (two) times daily.  120 tablet  3  . nitroGLYCERIN (NITROSTAT) 0.4 MG SL tablet Place 1 tablet (0.4 mg total) under the tongue every  5 (five) minutes as needed for chest pain.  30 tablet  1  . quinapril (ACCUPRIL) 5 MG tablet Take 5 mg by mouth at bedtime.      . rosuvastatin (CRESTOR) 20 MG tablet Take 20 mg by mouth daily.        ROS: A full review of systems is obtained and is negative except as noted in the HPI.  Physical Exam: Blood pressure 125/65, pulse 70, temperature 98 F (36.7 C), temperature source Oral, resp. rate 24, SpO2 93.00%.  GENERAL: no acute distress.  EYES: Extra ocular movements are intact. There is no lid lag. Sclera is anicteric.  ENT: Oropharynx is clear. Dentition is within normal limits.  NECK: Supple. The thyroid is not enlarged.  LYMPH: There are no masses or lymphadenopathy present.  HEART: Regular rate and rhythm with no m/g/r.  Normal S1/S2. No JVD LUNGS: Clear to auscultation There are no rales, rhonchi, or wheezes.  ABDOMEN: Soft, non-tender, and non-distended with normoactive bowel sounds. There is no hepatosplenomegaly.  EXTREMITIES: No clubbing, cyanosis, or edema.  PULSES: Carotids were +2 and equal bilaterally with no bruits. Femoral pulses were +2 and equal bilaterally. DP/PT pulses were +2 and equal bilaterally.  SKIN: Warm, dry, and intact.  NEUROLOGIC: The patient was oriented to person, place, and time. No overt neurologic deficits were detected.  PSYCH: Normal judgment and insight, mood is appropriate.   Results: Results for orders placed during the hospital encounter of 03/08/13 (from the past 24 hour(s))  CBC WITH DIFFERENTIAL     Status: Abnormal   Collection Time    03/08/13 10:22 PM      Result Value Range   WBC 10.9 (*) 4.0 - 10.5 K/uL   RBC 4.78  4.22 - 5.81 MIL/uL   Hemoglobin 13.9  13.0 - 17.0 g/dL   HCT 40.9  81.1 - 91.4 %   MCV 82.8  78.0 - 100.0 fL   MCH 29.1  26.0 - 34.0 pg   MCHC 35.1  30.0 - 36.0 g/dL   RDW 78.2  95.6 - 21.3 %   Platelets 206  150 - 400 K/uL   Neutrophils Relative % 70  43 - 77 %   Neutro Abs 7.7  1.7 - 7.7 K/uL   Lymphocytes  Relative 21  12 - 46 %   Lymphs Abs 2.3  0.7 - 4.0 K/uL   Monocytes Relative 7  3 - 12 %   Monocytes Absolute 0.7  0.1 - 1.0 K/uL   Eosinophils Relative 2  0 - 5 %   Eosinophils Absolute 0.2  0.0 - 0.7 K/uL   Basophils Relative 0  0 - 1 %   Basophils Absolute 0.0  0.0 - 0.1 K/uL  APTT     Status:  None   Collection Time    03/08/13 10:22 PM      Result Value Range   aPTT 30  24 - 37 seconds  PROTIME-INR     Status: None   Collection Time    03/08/13 10:22 PM      Result Value Range   Prothrombin Time 13.3  11.6 - 15.2 seconds   INR 1.03  0.00 - 1.49  TROPONIN I     Status: None   Collection Time    03/08/13 10:22 PM      Result Value Range   Troponin I <0.30  <0.30 ng/mL  POCT I-STAT TROPONIN I     Status: None   Collection Time    03/08/13 10:43 PM      Result Value Range   Troponin i, poc 0.00  0.00 - 0.08 ng/mL   Comment 3           POCT I-STAT, CHEM 8     Status: Abnormal   Collection Time    03/08/13 10:45 PM      Result Value Range   Sodium 140  135 - 145 mEq/L   Potassium 4.2  3.5 - 5.1 mEq/L   Chloride 107  96 - 112 mEq/L   BUN 14  6 - 23 mg/dL   Creatinine, Ser 9.14  0.50 - 1.35 mg/dL   Glucose, Bld 782 (*) 70 - 99 mg/dL   Calcium, Ion 9.56  2.13 - 1.23 mmol/L   TCO2 22  0 - 100 mmol/L   Hemoglobin 13.9  13.0 - 17.0 g/dL   HCT 08.6  57.8 - 46.9 %    EKG: NSR, rate 69. Low voltage. No evidence of acute ischemia.  CXR: No acute cardiopulmonary disease  Assessment/Plan: 1. Persistent episodes of chest pain 2. Syncope 3. Reassuring initial ED evaluation 4. Hx of chronic ischemic heart disease 5. Multiple CHD risk factors 6. Prior hx of substance abuse  - Will place in observation in telemetry unit and tentatively plan to complete already scheduled cardiac catheterization in AM. In interim, will rule out acute MI, check UDS and c/w home medication program. SSI for diabetes. NPO past midnight.   Wylee Dorantes 03/08/2013, 11:44 PM

## 2013-03-08 NOTE — ED Notes (Signed)
Pt requests that family/visitors NOT BE told of his cocaine abuse history.

## 2013-03-08 NOTE — ED Provider Notes (Signed)
CSN: [id number]     Arrival date & time 03/08/13  2218 History     First MD Initiated Contact with Patient 03/08/13 2221     Chief Complaint  Patient presents with  . Shortness of Breath  . Chest Pain   (Consider location/radiation/quality/duration/timing/severity/associated sxs/prior Treatment) Patient is a 57 y.o. male presenting with shortness of breath. The history is provided by the patient and the EMS personnel.  Shortness of Breath Severity:  Moderate Onset quality:  Gradual Duration:  45 minutes Timing:  Constant Progression:  Unchanged Chronicity:  New Relieved by:  Nothing Worsened by:  Nothing tried Ineffective treatments:  None tried Associated symptoms: abdominal pain, chest pain, diaphoresis and syncope   Associated symptoms: no claudication, no cough, no fever, no headaches, no rash, no vomiting and no wheezing   Abdominal pain:    Location:  LUQ and RUQ   Quality:  Aching   Severity:  Mild   Onset quality:  Gradual   Duration:  1 day   Timing:  Constant   Progression:  Unchanged   Chronicity:  New Chest pain:    Quality:  Pressure   Severity:  Severe   Onset quality:  Sudden   Duration:  1 hour   Timing:  Constant   Progression:  Unchanged   Chronicity:  New Syncope:    Duration:  5 seconds (3 episodes)   Witnessed: yes     Suspicion of head trauma:  No   Past Medical History  Diagnosis Date  . Essential hypertension, benign   . TIA (transient ischemic attack) 1997    No residual neurological deficits.  . Pneumonia   . Type 2 diabetes mellitus 2011  . Hepatitis Late 1970s    Treated in the hospital, finished treatment course. No recurrence  since  . Noncompliance   . Coronary atherosclerosis of native coronary artery   . COPD (chronic obstructive pulmonary disease)    Past Surgical History  Procedure Laterality Date  . Appendectomy     Family History  Problem Relation Age of Onset  . Hypertension    . Diabetes    . Stroke    .  Cancer Father     Lung  . Cancer Mother     Thyroid  . Cancer Maternal Grandmother     Breast  . Cancer Maternal Grandfather     Throat and stomach  . CAD Father   . CAD Brother    History  Substance Use Topics  . Smoking status: Current Every Day Smoker    Types: Cigarettes  . Smokeless tobacco: Not on file     Comment: Patient does not think it is a problem for him at this time.  . Alcohol Use: No    Review of Systems  Constitutional: Positive for diaphoresis. Negative for fever, chills and fatigue.  Respiratory: Positive for shortness of breath. Negative for cough, chest tightness and wheezing.   Cardiovascular: Positive for chest pain and syncope. Negative for palpitations, claudication and leg swelling.  Gastrointestinal: Positive for abdominal pain and abdominal distention. Negative for nausea, vomiting and diarrhea.  Musculoskeletal: Negative for back pain.  Skin: Negative for pallor and rash.  Neurological: Positive for syncope. Negative for dizziness, seizures, facial asymmetry, speech difficulty, weakness, light-headedness, numbness and headaches.  All other systems reviewed and are negative.    Allergies  Review of patient's allergies indicates no known allergies.  Home Medications   Current Outpatient Rx  Name  Route  Sig  Dispense  Refill  . aspirin 81 MG chewable tablet   Oral   Chew 1 tablet (81 mg total) by mouth daily.   30 tablet   2   . clopidogrel (PLAVIX) 75 MG tablet   Oral   Take 1 tablet (75 mg total) by mouth daily with breakfast.   30 tablet   5   . diltiazem (CARDIZEM CD) 240 MG 24 hr capsule   Oral   Take 1 capsule (240 mg total) by mouth daily.   30 capsule   2   . esomeprazole (NEXIUM) 40 MG capsule   Oral   Take 40 mg by mouth daily before breakfast.         . fish oil-omega-3 fatty acids 1000 MG capsule   Oral   Take 1 g by mouth 4 (four) times daily.         [name] glipiZIDE (GLUCOTROL) 2.5 mg TABS   Oral   Take 0.5  tablets (2.5 mg total) by mouth 2 (two) times daily before a meal.   60 tablet   0   . isosorbide mononitrate (IMDUR) 30 MG 24 hr tablet   Oral   Take 1 tablet (30 mg total) by mouth 2 (two) times daily.   120 tablet   3   . nitroGLYCERIN (NITROSTAT) 0.4 MG SL tablet   Sublingual   Place 1 tablet (0.4 mg total) under the tongue every 5 (five) minutes as needed for chest pain.   30 tablet   1   . quinapril (ACCUPRIL) 5 MG tablet   Oral   Take 5 mg by mouth at bedtime.         . rosuvastatin (CRESTOR) 20 MG tablet   Oral   Take 20 mg by mouth daily.          BP 111/70  Pulse 65  Temp(Src) 98 F (36.7 C) (Oral)  Resp 15  SpO2 93% Physical Exam  Nursing note and vitals reviewed. Constitutional: He is oriented to person, place, and time. He appears well-developed and well-nourished. No distress.  HENT:  Head: Normocephalic and atraumatic.  Eyes: EOM are normal. Pupils are equal, round, and reactive to light.  Neck: Normal range of motion. Neck supple.  Cardiovascular: Normal rate, regular rhythm, normal heart sounds and intact distal pulses.   Pulmonary/Chest: Effort normal and breath sounds normal. No respiratory distress. He has no wheezes. He has no rales.  Abdominal: Soft. Bowel sounds are normal. He exhibits distension. There is tenderness (mild) in the right upper quadrant and left upper quadrant. There is no rigidity, no rebound, no guarding and negative Murphy's sign.  Musculoskeletal: Normal range of motion. He exhibits no edema and no tenderness.  Neurological: He is alert and oriented to person, place, and time. He has normal strength. No cranial nerve deficit or sensory deficit. He exhibits normal muscle tone. He displays a negative Romberg sign. Coordination normal. GCS eye subscore is 4. GCS verbal subscore is 5. GCS motor subscore is 6.  Skin: Skin is warm and dry. No rash noted. He is not diaphoretic.    ED Course   Procedures (including critical care  time)  Labs Reviewed  CBC WITH DIFFERENTIAL - Abnormal; Notable for the following:    WBC 10.9 (*)    All other components within normal limits  POCT I-STAT, CHEM 8 - Abnormal; Notable for the following:    Glucose, Bld 196 (*)    All other components within normal limits  APTT  PROTIME-INR  TROPONIN I  COMPREHENSIVE METABOLIC PANEL  POCT I-STAT TROPONIN I   Dg Chest Port 1 View  03/08/2013   *RADIOLOGY REPORT*  Clinical Data: .  PORTABLE CHEST - 1 VIEW  Comparison: 11/03/2012 and 06/01/2012  Findings: Heart size and vascularity are within normal limits considering the shallow inspiration.  Interstitial markings are diffusely slightly accentuated, felt to be due to the shallow inspiration.  No consolidative infiltrates or effusions.  No acute osseous abnormality.  IMPRESSION: No acute abnormality.   Original Report Authenticated By: Francene Boyers, M.D.   1. Syncope   2. Chest pain   3. Bradycardia      Date: 03/08/2013  Rate: 70  Rhythm: normal sinus rhythm  QRS Axis: normal  Intervals: normal  ST/T Wave abnormalities: normal  Conduction Disutrbances:none  Narrative Interpretation: low voltage  Old EKG Reviewed: unchanged   MDM  57 y.o. M with a PMH of CAD with 3 vessel disease, TIA, HTN presenting via EMS with chest pain as well as several syncopal episodes with associated bradycardia noted by EMS en route.  Pt is scheduled for cardiac cath tomorrow morning.  He states he developed chest pain described as severe pressure as well as dyspnea so called EMS.  EMS reports that pt had about 3 episodes in which his HR dropped into the 30s and pt syncopized.  Episodes lasted about 5 seconds with subsequent improvement in HR into the 70s.  They were unable to document these episodes but state he appeared to be in sinus rhythm throughout.  Pt received ASA, NTG and morphine en route.  On arrival, pt stable with HR in 70s in sinus rhythm, BP 124/78, O2 sats 91% on RA.  Pt alert and oriented  x 3.  Physical exam with CV and lung exam unremarkable.  Abdomen distended with mild tenderness in upper quadrants;  Soft with no rebound or guarding.  Exam otherwise WNL.  EKG obtained shows NSR, low voltage, with no acute ST-T wave abnormalities.  Will workup for ACS.  Will need admission given history and bradycardic episodes in addition to pt being scheduled for cath in AM.  CXR with no acute abnormalities.  Troponin normal, no other acute lab abnormalities.  Plan for admission to cardiology for further cardiac workup given concerning chest pain as well as syncope and bradycardia.  Discussed with attending Dr. Hyacinth Meeker.      Jodean Lima, MD 03/09/13 0010

## 2013-03-08 NOTE — ED Notes (Signed)
Pt from home. Started having substernal chest pain/pressure and increased SOB approximately an hour prior to arrival. [facility] inserted 18g piv in left wrist and administered 4mg  morphine. Also gave pt 3 nitro tabs Sl and 324 baby ASA. Chest pain/pressure went from 10/;10 to 5/10. Pt's heart rate dropped down to the 20's and 30's. Pt had 3 syncopal episodes approximately 2-3 seconds in duration en route to the ER.  Pt is supposd to have a cardiac catheterization in the am by [facility] for 70% of 3 coronary vessels. Pt reports diaphoreses, denies nausea/vomiting, dizziness, or light headedness.

## 2013-03-09 ENCOUNTER — Ambulatory Visit (HOSPITAL_COMMUNITY): Admission: RE | Admit: 2013-03-09 | Payer: Self-pay | Source: Ambulatory Visit | Admitting: Cardiovascular Disease

## 2013-03-09 ENCOUNTER — Encounter (HOSPITAL_COMMUNITY)
Admission: EM | Disposition: A | Discharge status: Home / Self Care | Payer: Self-pay | Source: Home / Self Care | Attending: Cardiovascular Disease

## 2013-03-09 ENCOUNTER — Encounter (HOSPITAL_COMMUNITY): Payer: Self-pay | Admitting: General Practice

## 2013-03-09 ENCOUNTER — Other Ambulatory Visit: Payer: Self-pay

## 2013-03-09 DIAGNOSIS — I2510 Atherosclerotic heart disease of native coronary artery without angina pectoris: Secondary | ICD-10-CM

## 2013-03-09 HISTORY — PX: LEFT HEART CATHETERIZATION WITH CORONARY ANGIOGRAM: SHX5451

## 2013-03-09 LAB — CREATININE, SERUM
Creatinine, Ser: 1.08 mg/dL (ref 0.50–1.35)
GFR calc non Af Amer: 74 mL/min — ABNORMAL LOW (ref >90)

## 2013-03-09 LAB — GLUCOSE, CAPILLARY
Glucose-Capillary: 144 mg/dL — ABNORMAL HIGH (ref 70–99)
Glucose-Capillary: 274 mg/dL — ABNORMAL HIGH (ref 70–99)

## 2013-03-09 LAB — RAPID URINE DRUG SCREEN, HOSP PERFORMED
Opiates: POSITIVE — AB
Tetrahydrocannabinol: NOT DETECTED

## 2013-03-09 LAB — HEMOGLOBIN A1C
Hgb A1c MFr Bld: 7.4 % — ABNORMAL HIGH (ref <5.7)
Mean Plasma Glucose: 166 mg/dL — ABNORMAL HIGH (ref <117)

## 2013-03-09 LAB — CBC
MCH: 28.5 pg (ref 26.0–34.0)
MCHC: 33.9 g/dL (ref 30.0–36.0)
MCV: 84 fL (ref 78.0–100.0)
Platelets: 205 10*3/uL (ref 150–400)
RBC: 4.63 MIL/uL (ref 4.22–5.81)
RDW: 14.1 % (ref 11.5–15.5)

## 2013-03-09 LAB — BASIC METABOLIC PANEL
CO2: 22 mEq/L (ref 19–32)
Calcium: 8.9 mg/dL (ref 8.4–10.5)
Creatinine, Ser: 1.03 mg/dL (ref 0.50–1.35)
GFR calc non Af Amer: 79 mL/min — ABNORMAL LOW (ref >90)

## 2013-03-09 LAB — COMPREHENSIVE METABOLIC PANEL
Albumin: 2.9 g/dL — ABNORMAL LOW (ref 3.5–5.2)
BUN: 15 mg/dL (ref 6–23)
Creatinine, Ser: 1.1 mg/dL (ref 0.50–1.35)
Potassium: 4.1 mEq/L (ref 3.5–5.1)
Total Protein: 6.5 g/dL (ref 6.0–8.3)

## 2013-03-09 LAB — TROPONIN I: Troponin I: <0.3 ng/mL (ref <0.30)

## 2013-03-09 SURGERY — LEFT HEART CATHETERIZATION WITH CORONARY ANGIOGRAM
Anesthesia: LOCAL

## 2013-03-09 MED ORDER — BIVALIRUDIN 250 MG IV SOLR
INTRAVENOUS | Status: AC
  Filled 2013-03-09: qty 250

## 2013-03-09 MED ORDER — SODIUM CHLORIDE 0.9 % IV SOLN: 1.0000 mL/kg/h | INTRAVENOUS | Status: AC

## 2013-03-09 MED ORDER — LIDOCAINE HCL (PF) 1 % IJ SOLN
INTRAMUSCULAR | Status: AC
  Filled 2013-03-09: qty 30

## 2013-03-09 MED ORDER — SODIUM CHLORIDE 0.9 % IJ SOLN: 3.0000 mL | INTRAMUSCULAR | Status: DC | PRN

## 2013-03-09 MED ORDER — INSULIN ASPART 100 UNIT/ML ~~LOC~~ SOLN
0.0000 [IU] | Freq: Three times a day (TID) | SUBCUTANEOUS | Status: DC
  Administered 2013-03-09: 19:00:00 8 [IU] via SUBCUTANEOUS
  Administered 2013-03-09: 14:00:00 3 [IU] via SUBCUTANEOUS
  Administered 2013-03-10: 10:00:00 5 [IU] via SUBCUTANEOUS

## 2013-03-09 MED ORDER — ASPIRIN 81 MG PO CHEW
324.0000 mg | CHEWABLE_TABLET | ORAL | Status: AC
  Administered 2013-03-09: 324 mg via ORAL
  Filled 2013-03-09 (×2): qty 4

## 2013-03-09 MED ORDER — ASPIRIN 81 MG PO CHEW
81.0000 mg | CHEWABLE_TABLET | Freq: Every day | ORAL | Status: DC
  Administered 2013-03-10: 81 mg via ORAL
  Filled 2013-03-09: qty 1

## 2013-03-09 MED ORDER — CLOPIDOGREL BISULFATE 75 MG PO TABS
75.0000 mg | ORAL_TABLET | Freq: Every day | ORAL | Status: DC
  Administered 2013-03-09 – 2013-03-10 (×2): 75 mg via ORAL
  Filled 2013-03-09 (×3): qty 1

## 2013-03-09 MED ORDER — SODIUM CHLORIDE 0.9 % IV SOLN: 250.0000 mL | INTRAVENOUS | Status: DC | PRN

## 2013-03-09 MED ORDER — ATORVASTATIN CALCIUM 40 MG PO TABS
40.0000 mg | ORAL_TABLET | Freq: Every day | ORAL | Status: DC
  Administered 2013-03-09: 40 mg via ORAL
  Filled 2013-03-09 (×3): qty 1

## 2013-03-09 MED ORDER — ASPIRIN 81 MG PO CHEW: 324.0000 mg | CHEWABLE_TABLET | ORAL | Status: DC

## 2013-03-09 MED ORDER — CLOPIDOGREL BISULFATE 300 MG PO TABS
ORAL_TABLET | ORAL | Status: AC
  Filled 2013-03-09: qty 1

## 2013-03-09 MED ORDER — SODIUM CHLORIDE 0.9 % IJ SOLN: 3.0000 mL | Freq: Two times a day (BID) | INTRAMUSCULAR | Status: DC

## 2013-03-09 MED ORDER — FENTANYL CITRATE 0.05 MG/ML IJ SOLN
INTRAMUSCULAR | Status: AC
  Filled 2013-03-09: qty 2

## 2013-03-09 MED ORDER — ATORVASTATIN CALCIUM 40 MG PO TABS: 40.0000 mg | ORAL_TABLET | Freq: Every day | ORAL | Status: DC

## 2013-03-09 MED ORDER — MIDAZOLAM HCL 2 MG/2ML IJ SOLN
INTRAMUSCULAR | Status: AC
  Filled 2013-03-09: qty 2

## 2013-03-09 MED ORDER — NITROGLYCERIN 0.4 MG SL SUBL: 0.4000 mg | SUBLINGUAL_TABLET | SUBLINGUAL | Status: DC | PRN

## 2013-03-09 MED ORDER — HEPARIN (PORCINE) IN NACL 2-0.9 UNIT/ML-% IJ SOLN
INTRAMUSCULAR | Status: AC
  Filled 2013-03-09: qty 1000

## 2013-03-09 MED ORDER — PANTOPRAZOLE SODIUM 40 MG PO TBEC
40.0000 mg | DELAYED_RELEASE_TABLET | Freq: Every day | ORAL | Status: DC
  Administered 2013-03-09 – 2013-03-10 (×2): 40 mg via ORAL
  Filled 2013-03-09 (×2): qty 1

## 2013-03-09 MED ORDER — ISOSORBIDE MONONITRATE ER 30 MG PO TB24
30.0000 mg | ORAL_TABLET | Freq: Two times a day (BID) | ORAL | Status: DC
  Administered 2013-03-09 – 2013-03-10 (×4): 30 mg via ORAL
  Filled 2013-03-09 (×7): qty 1

## 2013-03-09 MED ORDER — VERAPAMIL HCL 2.5 MG/ML IV SOLN
INTRAVENOUS | Status: AC
  Filled 2013-03-09: qty 2

## 2013-03-09 MED ORDER — SODIUM CHLORIDE 0.9 % IV SOLN
INTRAVENOUS | Status: DC
  Administered 2013-03-09: 75 mL via INTRAVENOUS

## 2013-03-09 MED ORDER — LISINOPRIL 5 MG PO TABS
5.0000 mg | ORAL_TABLET | Freq: Every day | ORAL | Status: DC
  Administered 2013-03-09 – 2013-03-10 (×2): 5 mg via ORAL
  Filled 2013-03-09 (×3): qty 1

## 2013-03-09 MED ORDER — DILTIAZEM HCL ER COATED BEADS 240 MG PO CP24
240.0000 mg | ORAL_CAPSULE | Freq: Every day | ORAL | Status: DC
  Administered 2013-03-09 – 2013-03-10 (×2): 240 mg via ORAL
  Filled 2013-03-09 (×3): qty 1

## 2013-03-09 MED ORDER — ACETAMINOPHEN 325 MG PO TABS
650.0000 mg | ORAL_TABLET | ORAL | Status: DC | PRN
  Administered 2013-03-09: 650 mg via ORAL
  Filled 2013-03-09: qty 2

## 2013-03-09 MED ORDER — HEPARIN SODIUM (PORCINE) 5000 UNIT/ML IJ SOLN
5000.0000 [IU] | Freq: Three times a day (TID) | INTRAMUSCULAR | Status: DC
  Administered 2013-03-09: 5000 [IU] via SUBCUTANEOUS
  Filled 2013-03-09 (×4): qty 1

## 2013-03-09 MED ORDER — ONDANSETRON HCL 4 MG/2ML IJ SOLN: 4.0000 mg | Freq: Four times a day (QID) | INTRAMUSCULAR | Status: DC | PRN

## 2013-03-09 MED ORDER — ENOXAPARIN SODIUM 40 MG/0.4ML ~~LOC~~ SOLN
40.0000 mg | SUBCUTANEOUS | Status: DC
  Administered 2013-03-10: 40 mg via SUBCUTANEOUS
  Filled 2013-03-09: qty 0.4

## 2013-03-09 MED ORDER — ASPIRIN 300 MG RE SUPP: 300.0000 mg | RECTAL | Status: DC

## 2013-03-09 MED ORDER — INSULIN ASPART 100 UNIT/ML ~~LOC~~ SOLN
4.0000 [IU] | Freq: Three times a day (TID) | SUBCUTANEOUS | Status: DC
  Administered 2013-03-09 – 2013-03-10 (×3): 4 [IU] via SUBCUTANEOUS

## 2013-03-09 MED ORDER — ASPIRIN 81 MG PO CHEW
324.0000 mg | CHEWABLE_TABLET | ORAL | Status: DC
  Filled 2013-03-09: qty 4

## 2013-03-09 MED ORDER — NITROGLYCERIN 0.2 MG/ML ON CALL CATH LAB
INTRAVENOUS | Status: AC
  Filled 2013-03-09: qty 1

## 2013-03-09 NOTE — Interval H&P Note (Signed)
History and Physical Interval Note:  03/09/2013 7:31 AM  [name]  has presented today for surgery, with the diagnosis of cp  The various methods of treatment have been discussed with the patient and family. After consideration of risks, benefits and other options for treatment, the patient has consented to  Procedure(s): LEFT HEART CATHETERIZATION WITH CORONARY ANGIOGRAM (N/A) as a surgical intervention .  The patient's history has been reviewed, patient examined, no change in status, stable for surgery.  I have reviewed the patient's chart and labs.  Questions were answered to the patient's satisfaction.    Cath Lab Visit (complete for each Cath Lab visit)  Clinical Evaluation Leading to the Procedure:   ACS: yes  Non-ACS:    Anginal Classification: CCS IV  Anti-ischemic medical therapy: Maximal Therapy (2 or more classes of medications)  Non-Invasive Test Results: No non-invasive testing performed  Prior CABG: No previous CABG          Tonny Bollman

## 2013-03-09 NOTE — CV Procedure (Signed)
Cardiac Catheterization Procedure Note  Name: RAFIEL MECCA MRN: [id number] DOB: [birth year]  Procedure: Left Heart Cath, Selective Coronary Angiography, LV angiography, PTCA and stenting of the LAD  Indication: Unstable angina. Patient with known CAD. He has had problems with drug abuse. He was managed medically when he presented with acute coronary syndrome last year. He's been taking better care of himself and has not used illicit drugs in the last 4 months. Despite optimal medical therapy, he notes worsening angina over the last few weeks, now with resting chest pain. He presents for cardiac catheterization and possible PCI. He has been taking aspirin and Plavix.  Procedural Details:  The right wrist was prepped, draped, and anesthetized with 1% lidocaine. Using the modified Seldinger technique, a 5/6 French slender sheath was introduced into the right radial artery. 3 mg of verapamil was administered through the sheath, weight-based unfractionated heparin was administered intravenously. Standard Judkins catheters were used for selective coronary angiography and left ventriculography. Catheter exchanges were performed over an exchange length guidewire.  PROCEDURAL FINDINGS Hemodynamics: AO 118/57 with a mean of 82 LV 113/26   Coronary angiography: Coronary dominance: right  Left mainstem: Patent without obstructive disease. Arises from the left cusp and divides into the LAD and left circumflex.  Left anterior descending (LAD): The LAD reaches the left ventricular apex. The vessel is diffusely diseased. The proximal LAD has 50% stenosis. The mid LAD after the second diagonal has 90% hypodense stenosis. The remaining LAD has diffuse irregularity. The diagonal branches are small to medium in caliber but widely patent.  Left circumflex (LCx): The left circumflex is a large, dominant vessel. The proximal and mid circumflex are patent without significant obstruction. The first obtuse  marginal is diffusely diseased but there is no severe stenosis noted. Notably, the vessel is improved from a cardiac catheterization last year. The left posterolateral branches and PDA branch are diffusely diseased in a distal vessel diabetic pattern. There is moderate to severe stenosis in all of these branches. Because of the distal vessel pattern they are not optimal for PCI or bypass revascularization.  Right coronary artery (RCA): Medium caliber, nondominant vessel. Moderate diffuse disease noted.  Left ventriculography: Left ventricular systolic function is normal, LVEF is estimated at 55%, there is no significant mitral regurgitation   PCI Note:  Following the diagnostic procedure, the decision was made to proceed with PCI.  Weight-based bivalirudin was given for anticoagulation. The mid LAD was the suspected culprit vessel. There was 80% hypodense stenosis in that region. This was progressed from the previous catheterization. Once a therapeutic ACT was achieved, a 6 [place] XB LAD guide catheter was inserted.  A BMW coronary guidewire was used to cross the lesion.  The lesion was predilated with a 2.5 x 12 mm balloon.  The lesion was then stented with a 2.5 x 16 mm promus premier drug-eluting stent.  The stent was postdilated with a 2.75 mm noncompliant balloon.  Following PCI, there was 0% residual stenosis and TIMI-3 flow. Final angiography confirmed an excellent result. The patient tolerated the procedure well. There were no immediate procedural complications. A TR band was used for radial hemostasis. The patient was transferred to the post catheterization recovery area for further monitoring.  PCI Data: Vessel - LAD/Segment - mid Percent Stenosis (pre)  80 TIMI-flow 3 Stent 2.5 x 16 mm promus DES Percent Stenosis (post) 0 TIMI-flow (post) 3  Final Conclusions:   1. Severe mid LAD stenosis with successful PCI using a drug-eluting stent  2. Moderate to severe diffuse distal vessel disease  in the left circumflex distribution as outlined 3. Preserved LV function   Recommendations:  Aggressive medical therapy and lifestyle modification. Dual antiplatelet therapy with aspirin and Plavix for 12 months minimum. Anticipate discharge home tomorrow.  Tonny Bollman 03/09/2013, 8:23 AM

## 2013-03-09 NOTE — Progress Notes (Signed)
Patient transferred to cath lab and report given to cath lab RN. Patient hemodynamically stable, and patient has no chest pain.

## 2013-03-09 NOTE — Progress Notes (Signed)
Utilization Review Completed.   Kristen Fromm, RN, BSN Nurse Case Manager  [phone]  

## 2013-03-10 ENCOUNTER — Encounter (HOSPITAL_COMMUNITY): Payer: Self-pay | Admitting: Nurse Practitioner

## 2013-03-10 DIAGNOSIS — F1910 Other psychoactive substance abuse, uncomplicated: Secondary | ICD-10-CM | POA: Diagnosis present

## 2013-03-10 DIAGNOSIS — E119 Type 2 diabetes mellitus without complications: Secondary | ICD-10-CM | POA: Diagnosis present

## 2013-03-10 DIAGNOSIS — E785 Hyperlipidemia, unspecified: Secondary | ICD-10-CM | POA: Diagnosis present

## 2013-03-10 DIAGNOSIS — I2510 Atherosclerotic heart disease of native coronary artery without angina pectoris: Secondary | ICD-10-CM | POA: Diagnosis present

## 2013-03-10 DIAGNOSIS — I200 Unstable angina: Secondary | ICD-10-CM

## 2013-03-10 DIAGNOSIS — I10 Essential (primary) hypertension: Secondary | ICD-10-CM | POA: Diagnosis present

## 2013-03-10 LAB — BASIC METABOLIC PANEL
Calcium: 8.8 mg/dL (ref 8.4–10.5)
Creatinine, Ser: 1.02 mg/dL (ref 0.50–1.35)
GFR calc non Af Amer: 80 mL/min — ABNORMAL LOW (ref >90)
Glucose, Bld: 213 mg/dL — ABNORMAL HIGH (ref 70–99)
Sodium: 135 mEq/L (ref 135–145)

## 2013-03-10 LAB — CBC
Hemoglobin: 13 g/dL (ref 13.0–17.0)
MCH: 28.7 pg (ref 26.0–34.0)
MCHC: 34.4 g/dL (ref 30.0–36.0)

## 2013-03-10 MED FILL — Dextrose Inj 5%: INTRAVENOUS | Qty: 1000 | Status: AC

## 2013-03-10 NOTE — Discharge Summary (Signed)
Patient ID: [name],  MRN: [id number], DOB/AGE: November 05, 1955 57 y.o.  Admit date: 03/08/2013 Discharge date: 03/10/2013  Primary Care Provider: Willow Ora Primary Cardiologist: Ival Bible, MD    Discharge Diagnoses Principal Problem:   Unstable angina  **s/p cath and PCI/DES to the LAD this admission.  Active Problems:   Coronary atherosclerosis of native coronary artery   Type 2 diabetes mellitus   Substance abuse (tobacco/cocaine)   Hypertension   Hyperlipidemia  Allergies No Known Allergies  Procedures  Cardiac Catheterization and Percutaneous Coronary Intervention 8.21.2014  PROCEDURAL FINDINGS Hemodynamics: AO 118/57 with a mean of 82 LV 113/26              Coronary angiography: Coronary dominance: right  Left mainstem: Patent without obstructive disease. Arises from the left cusp and divides into the LAD and left circumflex. Left anterior descending (LAD): The LAD reaches the left ventricular apex. The vessel is diffusely diseased. The proximal LAD has 50% stenosis. The mid LAD after the second diagonal has 90% hypodense stenosis. The remaining LAD has diffuse irregularity. The diagonal branches are small to medium in caliber but widely patent.   **The LAD was successfully stented using a 2.5 x 16 mm promus premier drug-eluting stent.**  Left circumflex (LCx): The left circumflex is a large, dominant vessel. The proximal and mid circumflex are patent without significant obstruction. The first obtuse marginal is diffusely diseased but there is no severe stenosis noted. Notably, the vessel is improved from a cardiac catheterization last year. The left posterolateral branches and PDA branch are diffusely diseased in a distal vessel diabetic pattern. There is moderate to severe stenosis in all of these branches. Because of the distal vessel pattern they are not optimal for PCI or bypass revascularization. Right coronary artery (RCA): Medium caliber, nondominant  vessel. Moderate diffuse disease noted.  Left ventriculogra phy: Left ventricular systolic function is normal, LVEF is estimated at 55%, there is no significant mitral regurgitation   Final Conclusions:   1. Severe mid LAD stenosis with successful PCI using a drug-eluting stent 2. Moderate to severe diffuse distal vessel disease in the left circumflex distribution as outlined 3. Preserved LV function  _____________   History of Present Illness  57 year old male with prior history of tobacco and cocaine abuse who was recently seen in clinic by Dr. Nona Dell with complaints of unstable angina. Decision was made to pursue outpatient diagnostic cardiac catheterization however, on the evening of admission, he developed recurrent chest discomfort while at rest. EMS was called and he was witnessed to have bradycardia with presyncope and syncope. Syncopal events were short-lived and ECG was largely unremarkable. He was taken to the [facility] where initial troponin was normal. He was admitted for further evaluation.  Hospital Course  Patient ruled out for myocardial infarction. He had no further chest pain. He underwent diagnostic cardiac catheterization on August 21, revealing severe LAD stenosis, and otherwise nonobstructive CAD and normal LV function. The LAD was successfully treated with drug-eluting stent placement. The patient tolerated the procedure well and post procedure has been doing well without recurrent symptoms or limitations. He will be discharged home today in good condition.  Discharge Vitals Blood pressure 95/51, pulse 61, temperature 97.6 F (36.4 C), temperature source Oral, resp. rate 18, height 5\' 9"  (1.753 m), weight 247 lb 5.7 oz (112.2 kg), SpO2 91.00%.  Filed Weights   03/09/13 0043 03/10/13 0435  Weight: 245 lb 9.5 oz (111.4 kg) 247 lb 5.7 oz (112.2 kg)  Labs  CBC  Recent Labs  03/08/13 2222  03/09/13 0400 03/10/13 0500  WBC 10.9*  --  10.7* 8.7  NEUTROABS  7.7  --   --   --   HGB 13.9  < > 13.2 13.0  HCT 39.6  < > 38.9* 37.8*  MCV 82.8  --  84.0 83.4  PLT 206  --  205 186  < > = values in this interval not displayed. Basic Metabolic Panel  Recent Labs  03/09/13 0400 03/10/13 0500  NA 137 135  K 4.6 4.2  CL 104 102  CO2 22 22  GLUCOSE 164* 213*  BUN 14 12  CREATININE 1.03 1.02  CALCIUM 8.9 8.8   Liver Function Tests  Recent Labs  03/09/13 0146  AST 11  ALT 13  ALKPHOS 77  BILITOT 0.2*  PROT 6.5  ALBUMIN 2.9*   Cardiac Enzymes  Recent Labs  03/08/13 2222 03/09/13 0047 03/09/13 0705  TROPONINI <0.30 <0.30 <0.30   Hemoglobin A1C  Recent Labs  03/09/13 0130  HGBA1C 7.4*   Disposition  Pt is being discharged home today in good condition.  Follow-up Plans & Appointments      Follow-up Information   Follow up with Joni Reining, NP On 03/24/2013. (1:40 PM)    Specialty:  Nurse Practitioner   Contact information:   [address] [place] [address] [phone]       Follow up with Willow Ora, PA-C. (as scheduled)    Specialty:  Physician Assistant   Contact information:   [facility] [address] [place] [address] [phone]       Discharge Medications    Medication List         aspirin 81 MG chewable tablet  Chew 1 tablet (81 mg total) by mouth daily.     clopidogrel 75 MG tablet  Commonly known as:  PLAVIX  Take 1 tablet (75 mg total) by mouth daily with breakfast.     diltiazem 240 MG 24 hr capsule  Commonly known as:  CARDIZEM CD  Take 1 capsule (240 mg total) by mouth daily.     esomeprazole 40 MG capsule  Commonly known as:  NEXIUM  Take 40 mg by mouth daily before breakfast.     fish oil-omega-3 fatty acids 1000 MG capsule  Take 1 g by mouth 4 (four) times daily.     glipiZIDE 2.5 mg Tabs tablet  Commonly known as:  GLUCOTROL  Take 0.5 tablets (2.5 mg total) by mouth 2 (two) times daily before a meal.     isosorbide  mononitrate 30 MG 24 hr tablet  Commonly known as:  IMDUR  Take 1 tablet (30 mg total) by mouth 2 (two) times daily.     nitroGLYCERIN 0.4 MG SL tablet  Commonly known as:  NITROSTAT  Place 1 tablet (0.4 mg total) under the tongue every 5 (five) minutes as needed for chest pain.     quinapril 5 MG tablet  Commonly known as:  ACCUPRIL  Take 5 mg by mouth at bedtime.     rosuvastatin 20 MG tablet  Commonly known as:  CRESTOR  Take 20 mg by mouth at bedtime.        Outstanding Labs/Studies  None  Duration of Discharge Encounter   Greater than 30 minutes including physician time.  Signed, Nicolasa Ducking NP 03/10/2013, 2:05 PM

## 2013-03-10 NOTE — Progress Notes (Signed)
    Subjective:  No CP or dyspnea.   Objective:  Vital Signs in the last 24 hours: Temp:  [97.7 F (36.5 C)-98.3 F (36.8 C)] 97.7 F (36.5 C) (08/22 0435) Pulse Rate:  [64-77] 65 (08/22 0714) Resp:  [16-18] 18 (08/22 0714) BP: (105-126)/(54-69) 120/63 mmHg (08/22 0714) SpO2:  [90 %-91 %] 91 % (08/22 0714) Weight:  [247 lb 5.7 oz (112.2 kg)] 247 lb 5.7 oz (112.2 kg) (08/22 0435)  Intake/Output from previous day: 08/21 0701 - 08/22 0700 In: 720 [P.O.:720] Out: -   Physical Exam: Pt is alert and oriented, NAD HEENT: normal Neck: JVP - normal Lungs: CTA bilaterally CV: RRR with 2/6 systolic ejection murmur at the LSB Abd: soft, NT, Positive BS, no hepatomegaly Ext: no C/C/E, distal pulses intact and equal, radial site clear Skin: warm/dry no rash   Lab Results:  Recent Labs  03/09/13 0400 03/10/13 0500  WBC 10.7* 8.7  HGB 13.2 13.0  PLT 205 186    Recent Labs  03/09/13 0400 03/10/13 0500  NA 137 135  K 4.6 4.2  CL 104 102  CO2 22 22  GLUCOSE 164* 213*  BUN 14 12  CREATININE 1.03 1.02    Recent Labs  03/09/13 0047 03/09/13 0705  TROPONINI <0.30 <0.30    Tele: Sinus rhythm, personally reviewed  Assessment/Plan:  1. Unstable angina - s/p PCI of the LAD. Continue ASA, plavix at least 12 months. Lifestyle modification, tobacco cessation, importance of med adherence reviewed in detail with patient who understands. Follow-up with Dr Diona Browner or PA/NP in [place] office within 2 weeks, thx.  2. HTN - BP well-controlled on current meds  3. Hyperlipidemia - on atorvastatin 40 mg.   Dispo: home this am.   Tonny Bollman, M.D. 03/10/2013, 7:43 AM

## 2013-03-10 NOTE — Progress Notes (Signed)
CARDIAC REHAB PHASE I   PRE:  Rate/Rhythm: 81SR  BP:  Supine: 135/67  Sitting:   Standing:    SaO2: 93%RA  MODE:  Ambulation: 500 ft   POST:  Rate/Rhythm: 83SR  BP:  Supine:   Sitting: 118/55  Standing:    SaO2: 97%RA 0755-0855 Pt walked 500 ft on RA with steady gait. Does appear winded. Pt states he gained 9 pounds in month and he has been winded. Feels that it has to do more with his weight. Discussed with pt had losing weight is beneficial to his breathing as well as his diabetes. Discussed smoking cessation and have handout. Encouraged pt to call 1800quitnow for coaching. States he has never really tried to quit before but willing now. Education completed. Reviewed carb counting. Discussed CRP 2 and pt is interested if financial asst available. Gave pt financial form for CRP [place] to fill out. No CP with walk.   Luetta Nutting, RN BSN  03/10/2013 8:47 AM

## 2013-03-13 NOTE — ED Provider Notes (Signed)
I saw and evaluated the patient, reviewed the resident's note and I agree with the findings and plan.  Please see my separate note regarding my evaluation of the patient.  Clinical Impression:  Syncope    Vida Roller, MD 03/13/13 [phone]

## 2013-03-24 ENCOUNTER — Ambulatory Visit (INDEPENDENT_AMBULATORY_CARE_PROVIDER_SITE_OTHER): Payer: Self-pay | Admitting: Adult Health

## 2013-03-24 ENCOUNTER — Encounter: Payer: Self-pay | Admitting: Adult Health

## 2013-03-24 VITALS — BP 135/73 | HR 78 | Ht 69.0 in | Wt 243.0 lb

## 2013-03-24 DIAGNOSIS — I10 Essential (primary) hypertension: Secondary | ICD-10-CM

## 2013-03-24 DIAGNOSIS — E785 Hyperlipidemia, unspecified: Secondary | ICD-10-CM

## 2013-03-24 DIAGNOSIS — I2510 Atherosclerotic heart disease of native coronary artery without angina pectoris: Secondary | ICD-10-CM

## 2013-03-24 MED ORDER — OMEPRAZOLE 20 MG PO CPDR: 20.0000 mg | DELAYED_RELEASE_CAPSULE | Freq: Every day | ORAL | Status: DC

## 2013-03-24 NOTE — Progress Notes (Signed)
HPI: Mr. Rann is a 57 year old patient of Dr. Diona Browner we are following for ongoing assessment and management of CAD, with history of tobacco and cocaine abuse, who was last seen by Dr. Diona Browner in the office in August of 2014 with complaints of recurrent chest pain. The patient was transferred to [facility] hospital in the setting of unstable angina, and bradycardia with presyncope and syncope.       Patient underwent diagnostic cardiac catheterization August 21 revealing severe mid LAD stenosis and otherwise nonobstructive CAD and preserved LV function. The LAD was treated with drug-eluting stent  He was sent home on dual antiplatelet therapy with clopidogrel and aspirin, he was also started on quinapril 5 mg daily, isosorbide 30 mg daily, nitroglycerin sublingual; continued on pravastatin 20 mg daily and diltiazem 240 mg daily.   He comes today with some soreness in his right wrist from cath insertion, with pain into the elbow. This is improving. He still occasionally has DOE and some chest pressure while using vacuum cleaner, but pain goes away with rest.   No Known Allergies  Current Outpatient Prescriptions  Medication Sig Dispense Refill  . aspirin 81 MG chewable tablet Chew 1 tablet (81 mg total) by mouth daily.  30 tablet  2  . clopidogrel (PLAVIX) 75 MG tablet Take 1 tablet (75 mg total) by mouth daily with breakfast.  30 tablet  5  . diltiazem (CARDIZEM CD) 240 MG 24 hr capsule Take 1 capsule (240 mg total) by mouth daily.  30 capsule  2  . esomeprazole (NEXIUM) 40 MG capsule Take 40 mg by mouth daily before breakfast.      . fish oil-omega-3 fatty acids 1000 MG capsule Take 1 g by mouth 4 (four) times daily.      [name] glipiZIDE (GLUCOTROL) 2.5 mg TABS Take 0.5 tablets (2.5 mg total) by mouth 2 (two) times daily before a meal.  60 tablet  0  . isosorbide mononitrate (IMDUR) 30 MG 24 hr tablet Take 1 tablet (30 mg total) by mouth 2 (two) times daily.  120 tablet  3  . nitroGLYCERIN (NITROSTAT)  0.4 MG SL tablet Place 1 tablet (0.4 mg total) under the tongue every 5 (five) minutes as needed for chest pain.  30 tablet  1  . quinapril (ACCUPRIL) 5 MG tablet Take 5 mg by mouth at bedtime.      . rosuvastatin (CRESTOR) 20 MG tablet Take 20 mg by mouth at bedtime.       No current facility-administered medications for this visit.    Past Medical History  Diagnosis Date  . Hypertension   . TIA (transient ischemic attack) 1997    No residual neurological deficits.  . Hepatitis Late 1970s    Treated in the hospital, finished treatment course. No recurrence  since  . Noncompliance   . Coronary atherosclerosis of native coronary artery     a. 02/2013 Cath/PCI: LM nl, LAD: 50p, 55m (2.5x16 promus DES), LCX nl, OM1 min irregs, LPL/LPDA diff dzs, RCA nondom, mod diff dzs, EF 55%.  [name] COPD (chronic obstructive pulmonary disease)   . Hyperlipidemia   . History of pneumonia     "3-4 times" (03/09/2013)  . Type 2 diabetes mellitus 2011  . GERD (gastroesophageal reflux disease)   . Arthritis     "hands, knees" (03/09/2013)  . Substance abuse     Past Surgical History  Procedure Laterality Date  . Cardiac catheterization  05/2012  . Coronary angioplasty with stent placement  03/09/2013    "1" (03/09/2013)  . Appendectomy  1970's    ROS: Review of systems complete and found to be negative unless listed above  PHYSICAL EXAM BP 135/73  Pulse 78  Ht 5\' 9"  (1.753 m)  Wt 243 lb (110.224 kg)  BMI 35.87 kg/m2  General: Well developed, well nourished, in no acute distress Head: Eyes PERRLA, No xanthomas.   Normal cephalic and atramatic  Lungs: Clear bilaterally to auscultation and percussion. Heart: HRRR S1 S2, without MRG.  Pulses are 2+ & equal.            No carotid bruit. No JVD.   Abdomen: Bowel sounds are positive, abdomen obese, soft and non-tender without masses or Hernia's noted. Msk:  Back normal, normal gait. Normal strength and tone for age. Extremities: No clubbing, cyanosis  or edema. Right wrist with mild bruising but no edema or hematoma.  DP +1 Neuro: Alert and oriented X 3. Psych:  Good affect, responds appropriately EKG: NSR rate 75 bpm.  ASSESSMENT AND PLAN

## 2013-03-24 NOTE — Assessment & Plan Note (Signed)
Continue on statin therapy. He will have lipids and LFT';s checked in 3-4 months.

## 2013-03-24 NOTE — Assessment & Plan Note (Signed)
Good control at present. I explained to him that he may notice some ankle edema by the end of the day with use of CCB and nitrates. This should not be of concern to him. He verbalized understanding.

## 2013-03-24 NOTE — Assessment & Plan Note (Signed)
He is doing well s/p PCI to LAD. He is shown the stent video while here in the office. I will d/c Nexium and change him to protonix in the setting of use of DAPT with Plavix. He is to be established with cardiac rehab. We will see him in 4 months.

## 2013-03-24 NOTE — Patient Instructions (Addendum)
Your physician recommends that you schedule a follow-up appointment in: 4 months You will receive a reminder letter two months in advance reminding you to call and schedule your appointment. If you don't receive this letter, please contact our office.  Your physician has recommended you make the following change in your medication:  1. STOP NEXIUM 2. Start PRILOSEC 20 mg daily

## 2013-03-24 NOTE — Progress Notes (Signed)
Name: [name]    DOB: 04-30-1956  Age: 57 y.o.  MR#: [id number]       PCP:  Willow Ora, PA-C      Insurance: Payor: MEDICAID POTENTIAL / Plan: MEDICAID POTENTIAL / Product Type: *No Product type* /   CC:    Chief Complaint  Patient presents with  . Follow-up    VS Filed Vitals:   03/24/13 1351  BP: 135/73  Pulse: 78  Height: 5\' 9"  (1.753 m)  Weight: 243 lb (110.224 kg)    Weights Current Weight  03/24/13 243 lb (110.224 kg)  03/10/13 247 lb 5.7 oz (112.2 kg)  03/10/13 247 lb 5.7 oz (112.2 kg)    Blood Pressure  BP Readings from Last 3 Encounters:  03/24/13 135/73  03/10/13 95/51  03/10/13 95/51     Admit date:  (Not on file) Last encounter with RMR:  Visit date not found   Allergy Review of patient's allergies indicates no known allergies.  Current Outpatient Prescriptions  Medication Sig Dispense Refill  . aspirin 81 MG chewable tablet Chew 1 tablet (81 mg total) by mouth daily.  30 tablet  2  . clopidogrel (PLAVIX) 75 MG tablet Take 1 tablet (75 mg total) by mouth daily with breakfast.  30 tablet  5  . diltiazem (CARDIZEM CD) 240 MG 24 hr capsule Take 1 capsule (240 mg total) by mouth daily.  30 capsule  2  . esomeprazole (NEXIUM) 40 MG capsule Take 40 mg by mouth daily before breakfast.      . fish oil-omega-3 fatty acids 1000 MG capsule Take 1 g by mouth 4 (four) times daily.      [name] glipiZIDE (GLUCOTROL) 2.5 mg TABS Take 0.5 tablets (2.5 mg total) by mouth 2 (two) times daily before a meal.  60 tablet  0  . isosorbide mononitrate (IMDUR) 30 MG 24 hr tablet Take 1 tablet (30 mg total) by mouth 2 (two) times daily.  120 tablet  3  . nitroGLYCERIN (NITROSTAT) 0.4 MG SL tablet Place 1 tablet (0.4 mg total) under the tongue every 5 (five) minutes as needed for chest pain.  30 tablet  1  . quinapril (ACCUPRIL) 5 MG tablet Take 5 mg by mouth at bedtime.      . rosuvastatin (CRESTOR) 20 MG tablet Take 20 mg by mouth at bedtime.       No current  facility-administered medications for this visit.    Discontinued Meds:   There are no discontinued medications.  Patient Active Problem List   Diagnosis Date Noted  . Coronary atherosclerosis of native coronary artery   . Type 2 diabetes mellitus   . Substance abuse   . Hyperlipidemia   . Hypertension   . CAD (coronary artery disease), native coronary artery 11/04/2012  . Generalized anxiety disorder 11/04/2012  . Lipids abnormal 11/04/2012  . Diabetes mellitus type 2, uncontrolled, with cardiovascular complications 06/01/2012  . Tobacco abuse 06/01/2012  . H/O medication noncompliance 06/01/2012  . Cocaine abuse 06/01/2012  . Unstable angina 06/01/2012  . Essential hypertension, benign     LABS    Component Value Date/Time   NA 135 03/10/2013 0500   NA 137 03/09/2013 0400   NA 139 03/09/2013 0146   K 4.2 03/10/2013 0500   K 4.6 03/09/2013 0400   K 4.1 03/09/2013 0146   CL 102 03/10/2013 0500   CL 104 03/09/2013 0400   CL 106 03/09/2013 0146   CO2 22 03/10/2013 0500  CO2 22 03/09/2013 0400   CO2 24 03/09/2013 0146   GLUCOSE 213* 03/10/2013 0500   GLUCOSE 164* 03/09/2013 0400   GLUCOSE 169* 03/09/2013 0146   BUN 12 03/10/2013 0500   BUN 14 03/09/2013 0400   BUN 15 03/09/2013 0146   CREATININE 1.02 03/10/2013 0500   CREATININE 1.03 03/09/2013 0400   CREATININE 1.10 03/09/2013 0146   CALCIUM 8.8 03/10/2013 0500   CALCIUM 8.9 03/09/2013 0400   CALCIUM 8.7 03/09/2013 0146   GFRNONAA 80* 03/10/2013 0500   GFRNONAA 79* 03/09/2013 0400   GFRNONAA 73* 03/09/2013 0146   GFRAA >90 03/10/2013 0500   GFRAA >90 03/09/2013 0400   GFRAA 84* 03/09/2013 0146   CMP     Component Value Date/Time   NA 135 03/10/2013 0500   K 4.2 03/10/2013 0500   CL 102 03/10/2013 0500   CO2 22 03/10/2013 0500   GLUCOSE 213* 03/10/2013 0500   BUN 12 03/10/2013 0500   CREATININE 1.02 03/10/2013 0500   CALCIUM 8.8 03/10/2013 0500   PROT 6.5 03/09/2013 0146   ALBUMIN 2.9* 03/09/2013 0146   AST 11 03/09/2013 0146   ALT 13  03/09/2013 0146   ALKPHOS 77 03/09/2013 0146   BILITOT 0.2* 03/09/2013 0146   GFRNONAA 80* 03/10/2013 0500   GFRAA >90 03/10/2013 0500       Component Value Date/Time   WBC 8.7 03/10/2013 0500   WBC 10.7* 03/09/2013 0400   WBC 10.9* 03/08/2013 2222   HGB 13.0 03/10/2013 0500   HGB 13.2 03/09/2013 0400   HGB 13.9 03/08/2013 2245   HCT 37.8* 03/10/2013 0500   HCT 38.9* 03/09/2013 0400   HCT 41.0 03/08/2013 2245   MCV 83.4 03/10/2013 0500   MCV 84.0 03/09/2013 0400   MCV 82.8 03/08/2013 2222    Lipid Panel     Component Value Date/Time   CHOL 115 11/04/2012 0425   TRIG 234* 11/04/2012 0425   HDL 23* 11/04/2012 0425   CHOLHDL 5.0 11/04/2012 0425   VLDL 47* 11/04/2012 0425   LDLCALC 45 11/04/2012 0425    ABG    Component Value Date/Time   TCO2 22 03/08/2013 2245     Lab Results  Component Value Date   TSH 1.020 06/01/2012   BNP (last 3 results)  Recent Labs  05/31/12 0940 03/09/13 0047  PROBNP 128.8* 41.1   Cardiac Panel (last 3 results) No results found for this basename: CKTOTAL, CKMB, TROPONINI, RELINDX,  in the last 72 hours  Iron/TIBC/Ferritin No results found for this basename: iron, tibc, ferritin     EKG Orders placed during the hospital encounter of 03/08/13  . EKG 12-LEAD  . EKG 12-LEAD  . EKG 12-LEAD  . EKG 12-LEAD  . EKG 12-LEAD  . EKG 12-LEAD  . EKG 12-LEAD  . EKG 12-LEAD  . EKG 12-LEAD  . EKG  . EKG 12-LEAD  . EKG 12-LEAD     Prior Assessment and Plan Problem List as of 03/24/2013     Cardiovascular and Mediastinum   Essential hypertension, benign   Last Assessment & Plan   03/07/2013 Office Visit Written 03/07/2013  9:38 AM by Jonelle Sidle, MD     Blood pressure control is good today.    Unstable angina   CAD (coronary artery disease), native coronary artery   Last Assessment & Plan   03/07/2013 Office Visit Written 03/07/2013  9:37 AM by Jonelle Sidle, MD     Progressive angina symptoms as outlined. Patient has documented  CAD,  revascularization has not been pursued with previously discussed concerns about compliance and also cocaine use. Mr. Vetrano tells me that he has not used any cocaine since April and has been taking his medicines regularly. Despite this he continues to have accelerating angina. I have reviewed the records and see that Dr. Excell Seltzer reviewed the patient's films at the last admission. Plan at this time is to increase Imdur to 30 mg twice daily. I also plan to speak with Dr. Excell Seltzer today and likely schedule the patient for a repeat cardiac catheterization with tentative PCI this week if appropriate.    Coronary atherosclerosis of native coronary artery   Hypertension     Endocrine   Diabetes mellitus type 2, uncontrolled, with cardiovascular complications   Last Assessment & Plan   03/07/2013 Office Visit Written 03/07/2013  9:38 AM by Jonelle Sidle, MD     Now followed at the [facility] in [place].    Type 2 diabetes mellitus     Other   Tobacco abuse   H/O medication noncompliance   Cocaine abuse   Last Assessment & Plan   03/07/2013 Office Visit Written 03/07/2013  9:38 AM by Jonelle Sidle, MD     Patient states that he has not used any cocaine since April.    Generalized anxiety disorder   Lipids abnormal   Last Assessment & Plan   03/07/2013 Office Visit Written 03/07/2013  9:39 AM by Jonelle Sidle, MD     Recent LDL 86 on statin therapy.    Substance abuse   Hyperlipidemia       Imaging: Dg Chest Port 1 View  03/08/2013   *RADIOLOGY REPORT*  Clinical Data: .  PORTABLE CHEST - 1 VIEW  Comparison: 11/03/2012 and 06/01/2012  Findings: Heart size and vascularity are within normal limits considering the shallow inspiration.  Interstitial markings are diffusely slightly accentuated, felt to be due to the shallow inspiration.  No consolidative infiltrates or effusions.  No acute osseous abnormality.  IMPRESSION: No acute abnormality.   Original Report Authenticated By: Francene Boyers, M.D.

## 2013-04-06 ENCOUNTER — Ambulatory Visit (HOSPITAL_COMMUNITY): Payer: Self-pay

## 2013-04-06 ENCOUNTER — Telehealth: Payer: Self-pay | Admitting: Adult Health

## 2013-04-06 NOTE — Telephone Encounter (Signed)
Patient states that he has not received his meds yet that was ordered at his visit on 9/5 / tgs

## 2013-04-06 NOTE — Telephone Encounter (Signed)
Called pt home number to clarify pharmacy and no answer noted

## 2013-04-07 NOTE — Telephone Encounter (Signed)
.  left message to have patient return my call.  

## 2013-04-10 ENCOUNTER — Ambulatory Visit (HOSPITAL_COMMUNITY): Payer: Self-pay

## 2013-04-11 NOTE — Telephone Encounter (Signed)
Called pt however unable to talk at this time per family emergency, pt noted he will call our office back

## 2013-04-12 ENCOUNTER — Ambulatory Visit (HOSPITAL_COMMUNITY): Payer: Self-pay

## 2013-04-14 ENCOUNTER — Ambulatory Visit (HOSPITAL_COMMUNITY): Payer: Self-pay

## 2013-04-17 ENCOUNTER — Telehealth: Payer: Self-pay | Admitting: Adult Health

## 2013-04-17 ENCOUNTER — Ambulatory Visit (HOSPITAL_COMMUNITY): Payer: Self-pay

## 2013-04-17 MED ORDER — OMEPRAZOLE 20 MG PO CPDR: 20.0000 mg | DELAYED_RELEASE_CAPSULE | Freq: Every day | ORAL | Status: DC

## 2013-04-17 NOTE — Telephone Encounter (Signed)
Noted pt prescription sent in via HD LPN today

## 2013-04-17 NOTE — Telephone Encounter (Signed)
States he still has not received medication that was called in at his appointment 3 wks ago / tgs

## 2013-04-17 NOTE — Telephone Encounter (Signed)
Medication sent via escribe.  

## 2013-04-18 ENCOUNTER — Telehealth (HOSPITAL_COMMUNITY): Payer: Self-pay | Admitting: *Deleted

## 2013-04-19 ENCOUNTER — Ambulatory Visit (HOSPITAL_COMMUNITY): Payer: Self-pay

## 2013-04-20 ENCOUNTER — Ambulatory Visit (HOSPITAL_COMMUNITY): Payer: Self-pay

## 2013-04-21 ENCOUNTER — Ambulatory Visit (HOSPITAL_COMMUNITY): Payer: Self-pay

## 2013-04-24 ENCOUNTER — Ambulatory Visit (HOSPITAL_COMMUNITY): Payer: Self-pay

## 2013-04-26 ENCOUNTER — Ambulatory Visit (HOSPITAL_COMMUNITY): Payer: Self-pay

## 2013-04-28 ENCOUNTER — Ambulatory Visit (HOSPITAL_COMMUNITY): Payer: Self-pay

## 2013-05-01 ENCOUNTER — Ambulatory Visit (HOSPITAL_COMMUNITY): Payer: Self-pay

## 2013-05-03 ENCOUNTER — Ambulatory Visit (HOSPITAL_COMMUNITY): Payer: Self-pay

## 2013-05-05 ENCOUNTER — Ambulatory Visit (HOSPITAL_COMMUNITY): Payer: Self-pay

## 2013-05-08 ENCOUNTER — Ambulatory Visit (HOSPITAL_COMMUNITY): Payer: Self-pay

## 2013-05-10 ENCOUNTER — Ambulatory Visit (HOSPITAL_COMMUNITY): Payer: Self-pay

## 2013-05-12 ENCOUNTER — Ambulatory Visit (HOSPITAL_COMMUNITY): Payer: Self-pay

## 2013-05-15 ENCOUNTER — Ambulatory Visit (HOSPITAL_COMMUNITY): Payer: Self-pay

## 2013-05-17 ENCOUNTER — Ambulatory Visit (HOSPITAL_COMMUNITY): Payer: Self-pay

## 2013-05-19 ENCOUNTER — Ambulatory Visit (HOSPITAL_COMMUNITY): Payer: Self-pay

## 2013-05-22 ENCOUNTER — Ambulatory Visit (HOSPITAL_COMMUNITY): Payer: Self-pay

## 2013-05-24 ENCOUNTER — Ambulatory Visit (HOSPITAL_COMMUNITY): Payer: Self-pay

## 2013-05-26 ENCOUNTER — Ambulatory Visit (HOSPITAL_COMMUNITY): Payer: Self-pay

## 2013-05-29 ENCOUNTER — Ambulatory Visit (HOSPITAL_COMMUNITY): Payer: Self-pay

## 2013-05-31 ENCOUNTER — Ambulatory Visit (HOSPITAL_COMMUNITY): Payer: Self-pay

## 2013-06-02 ENCOUNTER — Ambulatory Visit (HOSPITAL_COMMUNITY): Payer: Self-pay

## 2013-06-05 ENCOUNTER — Ambulatory Visit (HOSPITAL_COMMUNITY): Payer: Self-pay

## 2013-06-07 ENCOUNTER — Ambulatory Visit (HOSPITAL_COMMUNITY): Payer: Self-pay

## 2013-06-09 ENCOUNTER — Ambulatory Visit (HOSPITAL_COMMUNITY): Payer: Self-pay

## 2013-06-12 ENCOUNTER — Ambulatory Visit (HOSPITAL_COMMUNITY): Payer: Self-pay

## 2013-06-14 ENCOUNTER — Ambulatory Visit (HOSPITAL_COMMUNITY): Payer: Self-pay

## 2013-06-19 ENCOUNTER — Ambulatory Visit (HOSPITAL_COMMUNITY): Payer: Self-pay

## 2013-06-21 ENCOUNTER — Ambulatory Visit (HOSPITAL_COMMUNITY): Payer: Self-pay

## 2013-06-23 ENCOUNTER — Ambulatory Visit (HOSPITAL_COMMUNITY): Payer: Self-pay

## 2013-06-26 ENCOUNTER — Ambulatory Visit (HOSPITAL_COMMUNITY): Payer: Self-pay

## 2013-06-28 ENCOUNTER — Ambulatory Visit (HOSPITAL_COMMUNITY): Payer: Self-pay

## 2013-06-30 ENCOUNTER — Ambulatory Visit (HOSPITAL_COMMUNITY): Payer: Self-pay

## 2013-07-03 ENCOUNTER — Ambulatory Visit (HOSPITAL_COMMUNITY): Payer: Self-pay

## 2013-07-05 ENCOUNTER — Ambulatory Visit (HOSPITAL_COMMUNITY): Payer: Self-pay

## 2013-07-07 ENCOUNTER — Ambulatory Visit (HOSPITAL_COMMUNITY): Payer: Self-pay

## 2013-07-10 ENCOUNTER — Ambulatory Visit (HOSPITAL_COMMUNITY): Payer: Self-pay

## 2013-07-12 ENCOUNTER — Ambulatory Visit (HOSPITAL_COMMUNITY): Payer: Self-pay

## 2013-07-28 ENCOUNTER — Ambulatory Visit (INDEPENDENT_AMBULATORY_CARE_PROVIDER_SITE_OTHER): Payer: Self-pay | Admitting: Adult Health

## 2013-07-28 ENCOUNTER — Encounter: Payer: Self-pay | Admitting: Adult Health

## 2013-07-28 VITALS — BP 124/54 | HR 83 | Ht 73.0 in | Wt 242.0 lb

## 2013-07-28 DIAGNOSIS — I2510 Atherosclerotic heart disease of native coronary artery without angina pectoris: Secondary | ICD-10-CM

## 2013-07-28 DIAGNOSIS — I200 Unstable angina: Secondary | ICD-10-CM

## 2013-07-28 DIAGNOSIS — E119 Type 2 diabetes mellitus without complications: Secondary | ICD-10-CM

## 2013-07-28 MED ORDER — ISOSORBIDE DINITRATE 30 MG PO TABS: 30.0000 mg | ORAL_TABLET | Freq: Two times a day (BID) | ORAL | Status: DC

## 2013-07-28 NOTE — Progress Notes (Signed)
HPI: [name] is a 58 year old patient of Dr. Domenic Polite were following for ongoing assessment and management of CAD, history of tobacco use and cocaine abuse, who was last seen in our office in September of 2014.    The patient had been having some chest discomfort and did not have a cardiac catheterization in August of 2014 demonstrated LAD stenosis , with PCI using a DES platform, and otherwise nonobstructive CAD with preserved LV function. He was placed on dual and high platelet therapy. He is here for six-month followup.   He is continued to have some exertional angina. He is caring for invalid mother. His blood sugars have been elevated and he continues to smoke. He states he has dropped smoking to 1/2 ppd.  No Known Allergies  Current Outpatient Prescriptions  Medication Sig Dispense Refill  . aspirin 81 MG chewable tablet Chew 1 tablet (81 mg total) by mouth daily.  30 tablet  2  . clopidogrel (PLAVIX) 75 MG tablet Take 1 tablet (75 mg total) by mouth daily with breakfast.  30 tablet  5  . diltiazem (CARDIZEM CD) 240 MG 24 hr capsule Take 1 capsule (240 mg total) by mouth daily.  30 capsule  2  . fish oil-omega-3 fatty acids 1000 MG capsule Take 1 g by mouth 4 (four) times daily.      [name] gabapentin (NEURONTIN) 100 MG capsule Take 200 mg by mouth at bedtime.      [name] glipiZIDE (GLUCOTROL) 2.5 mg TABS Take 0.5 tablets (2.5 mg total) by mouth 2 (two) times daily before a meal.  60 tablet  0  . isosorbide mononitrate (IMDUR) 30 MG 24 hr tablet Take 1 tablet (30 mg total) by mouth 2 (two) times daily.  120 tablet  3  . nitroGLYCERIN (NITROSTAT) 0.4 MG SL tablet Place 1 tablet (0.4 mg total) under the tongue every 5 (five) minutes as needed for chest pain.  30 tablet  1  . quinapril (ACCUPRIL) 5 MG tablet Take 5 mg by mouth at bedtime.      . rosuvastatin (CRESTOR) 20 MG tablet Take 20 mg by mouth at bedtime.      [name] omeprazole (PRILOSEC) 20 MG capsule Take 1 capsule (20 mg total) by mouth  daily.  30 capsule  6   No current facility-administered medications for this visit.    Past Medical History  Diagnosis Date  . Hypertension   . TIA (transient ischemic attack) 1997    No residual neurological deficits.  . Hepatitis Late 1970s    Treated in the hospital, finished treatment course. No recurrence  since  . Noncompliance   . Coronary atherosclerosis of native coronary artery     a. 02/2013 Cath/PCI: LM nl, LAD: 50p, 13m (2.5x16 promus DES), LCX nl, OM1 min irregs, LPL/LPDA diff dzs, RCA nondom, mod diff dzs, EF 55%.  [name] COPD (chronic obstructive pulmonary disease)   . Hyperlipidemia   . History of pneumonia     "3-4 times" (03/09/2013)  . Type 2 diabetes mellitus 2011  . GERD (gastroesophageal reflux disease)   . Arthritis     "hands, knees" (03/09/2013)  . Substance abuse     Past Surgical History  Procedure Laterality Date  . Cardiac catheterization  05/2012  . Coronary angioplasty with stent placement  03/09/2013    "1" (03/09/2013)  . Appendectomy  1970's    ZOX:[id number] of systems complete and found to be negative unless listed above  PHYSICAL EXAM BP 124/54  Pulse 83  Ht 6\' 1"  (1.854 m)  Wt 242 lb (109.77 kg)  BMI 31.93 kg/m2  General: Well developed, well nourished, in no acute distress Head: Eyes PERRLA, No xanthomas.   Normal cephalic and atramatic  Lungs: Clear bilaterally to auscultation and percussion. Heart: HRRR S1 S2, without MRG.  Pulses are 2+ & equal.            No carotid bruit. No JVD.  No abdominal bruits. No femoral bruits. Abdomen: Bowel sounds are positive, abdomen soft and non-tender without masses or                  Hernia's noted. Msk:  Back normal, normal gait. Normal strength and tone for age. Extremities: No clubbing, cyanosis or edema.  DP +1 Neuro: Alert and oriented X 3. Psych:  Good affect, responds appropriately    ASSESSMENT AND PLAN

## 2013-07-28 NOTE — Assessment & Plan Note (Signed)
Has not been well controlled [facility] with recommendations to have him seen by Endocrinologist.

## 2013-07-28 NOTE — Patient Instructions (Signed)
Your physician recommends that you schedule a follow-up appointment in: 6 months You will receive a reminder letter two months in advance reminding you to call and schedule your appointment. If you don't receive this letter, please contact our office.  Your physician recommends that you continue on your current medications as directed. Please refer to the Current Medication list given to you today.     

## 2013-07-28 NOTE — Assessment & Plan Note (Signed)
He is otherwise doing well. I have advised him to quit smoking as this is will significantly contribute to progression of CAD and ISR. He verbalizes understanding.

## 2013-07-28 NOTE — Progress Notes (Deleted)
Name: [name]    DOB: [birth year]  Age: 58 y.o.  MR#: [id number]       PCP:  Jacqualine Mau, PA-C      Insurance: Payor: / No coverage found.  CC:    Chief Complaint  Patient presents with  . Coronary Artery Disease  . Hypertension   PT NOTES HE IS STILL TAKING NEXIUM BECAUSE HIS PHARMACY HAS NOT SENT THE [place] WE FAXED IN SEPT, PT CONCERNED WITH THE [place] CONFLICT WITH PLAVIX, PT WANTED TO CLARIFY HOW OFTEN TO TAKE THE IMDUR PER PCP HAD QUESTION  VS Filed Vitals:   07/28/13 1417  BP: 124/54  Pulse: 83  Height: 6\' 1"  (1.854 m)  Weight: 242 lb (109.77 kg)    Weights Current Weight  07/28/13 242 lb (109.77 kg)  03/24/13 243 lb (110.224 kg)  03/10/13 247 lb 5.7 oz (112.2 kg)    Blood Pressure  BP Readings from Last 3 Encounters:  07/28/13 124/54  03/24/13 135/73  03/10/13 95/51     Admit date:  (Not on file) Last encounter with RMR:  04/17/2013   Allergy Review of patient's allergies indicates no known allergies.  Current Outpatient Prescriptions  Medication Sig Dispense Refill  . aspirin 81 MG chewable tablet Chew 1 tablet (81 mg total) by mouth daily.  30 tablet  2  . clopidogrel (PLAVIX) 75 MG tablet Take 1 tablet (75 mg total) by mouth daily with breakfast.  30 tablet  5  . diltiazem (CARDIZEM CD) 240 MG 24 hr capsule Take 1 capsule (240 mg total) by mouth daily.  30 capsule  2  . fish oil-omega-3 fatty acids 1000 MG capsule Take 1 g by mouth 4 (four) times daily.      [name] gabapentin (NEURONTIN) 100 MG capsule Take 200 mg by mouth at bedtime.      [name] glipiZIDE (GLUCOTROL) 2.5 mg TABS Take 0.5 tablets (2.5 mg total) by mouth 2 (two) times daily before a meal.  60 tablet  0  . isosorbide mononitrate (IMDUR) 30 MG 24 hr tablet Take 1 tablet (30 mg total) by mouth 2 (two) times daily.  120 tablet  3  . nitroGLYCERIN (NITROSTAT) 0.4 MG SL tablet Place 1 tablet (0.4 mg total) under the tongue every 5 (five) minutes as needed for chest pain.  30 tablet  1  .  quinapril (ACCUPRIL) 5 MG tablet Take 5 mg by mouth at bedtime.      . rosuvastatin (CRESTOR) 20 MG tablet Take 20 mg by mouth at bedtime.      [name] omeprazole (PRILOSEC) 20 MG capsule Take 1 capsule (20 mg total) by mouth daily.  30 capsule  6   No current facility-administered medications for this visit.    Discontinued Meds:   There are no discontinued medications.  Patient Active Problem List   Diagnosis Date Noted  . Coronary atherosclerosis of native coronary artery   . Type 2 diabetes mellitus   . Substance abuse   . Hyperlipidemia   . CAD (coronary artery disease), native coronary artery 11/04/2012  . Generalized anxiety disorder 11/04/2012  . Lipids abnormal 11/04/2012  . Diabetes mellitus type 2, uncontrolled, with cardiovascular complications 75/64/3329  . Tobacco abuse 06/01/2012  . H/O medication noncompliance 06/01/2012  . Cocaine abuse 06/01/2012  . Unstable angina 06/01/2012  . Essential hypertension, benign     LABS    Component Value Date/Time   NA 135 03/10/2013 0500   NA 137 03/09/2013 0400  NA 139 03/09/2013 0146   K 4.2 03/10/2013 0500   K 4.6 03/09/2013 0400   K 4.1 03/09/2013 0146   CL 102 03/10/2013 0500   CL 104 03/09/2013 0400   CL 106 03/09/2013 0146   CO2 22 03/10/2013 0500   CO2 22 03/09/2013 0400   CO2 24 03/09/2013 0146   GLUCOSE 213* 03/10/2013 0500   GLUCOSE 164* 03/09/2013 0400   GLUCOSE 169* 03/09/2013 0146   BUN 12 03/10/2013 0500   BUN 14 03/09/2013 0400   BUN 15 03/09/2013 0146   CREATININE 1.02 03/10/2013 0500   CREATININE 1.03 03/09/2013 0400   CREATININE 1.10 03/09/2013 0146   CALCIUM 8.8 03/10/2013 0500   CALCIUM 8.9 03/09/2013 0400   CALCIUM 8.7 03/09/2013 0146   GFRNONAA 80* 03/10/2013 0500   GFRNONAA 79* 03/09/2013 0400   GFRNONAA 73* 03/09/2013 0146   GFRAA >90 03/10/2013 0500   GFRAA >90 03/09/2013 0400   GFRAA 84* 03/09/2013 0146   CMP     Component Value Date/Time   NA 135 03/10/2013 0500   K 4.2 03/10/2013 0500   CL 102 03/10/2013 0500    CO2 22 03/10/2013 0500   GLUCOSE 213* 03/10/2013 0500   BUN 12 03/10/2013 0500   CREATININE 1.02 03/10/2013 0500   CALCIUM 8.8 03/10/2013 0500   PROT 6.5 03/09/2013 0146   ALBUMIN 2.9* 03/09/2013 0146   AST 11 03/09/2013 0146   ALT 13 03/09/2013 0146   ALKPHOS 77 03/09/2013 0146   BILITOT 0.2* 03/09/2013 0146   GFRNONAA 80* 03/10/2013 0500   GFRAA >90 03/10/2013 0500       Component Value Date/Time   WBC 8.7 03/10/2013 0500   WBC 10.7* 03/09/2013 0400   WBC 10.9* 03/08/2013 2222   HGB 13.0 03/10/2013 0500   HGB 13.2 03/09/2013 0400   HGB 13.9 03/08/2013 2245   HCT 37.8* 03/10/2013 0500   HCT 38.9* 03/09/2013 0400   HCT 41.0 03/08/2013 2245   MCV 83.4 03/10/2013 0500   MCV 84.0 03/09/2013 0400   MCV 82.8 03/08/2013 2222    Lipid Panel     Component Value Date/Time   CHOL 115 11/04/2012 0425   TRIG 234* 11/04/2012 0425   HDL 23* 11/04/2012 0425   CHOLHDL 5.0 11/04/2012 0425   VLDL 47* 11/04/2012 0425   LDLCALC 45 11/04/2012 0425    ABG    Component Value Date/Time   TCO2 22 03/08/2013 2245     Lab Results  Component Value Date   TSH 1.020 06/01/2012   BNP (last 3 results)  Recent Labs  03/09/13 0047  PROBNP 41.1   Cardiac Panel (last 3 results) No results found for this basename: CKTOTAL, CKMB, TROPONINI, RELINDX,  in the last 72 hours  Iron/TIBC/Ferritin No results found for this basename: iron, tibc, ferritin     EKG Orders placed in visit on 03/24/13  . EKG 12-LEAD     Prior Assessment and Plan Problem List as of 07/28/2013   Essential hypertension, benign   Last Assessment & Plan   03/24/2013 Office Visit Written 03/24/2013  2:06 PM by Lendon Colonel, NP     Good control at present. I explained to him that he may notice some ankle edema by the end of the day with use of CCB and nitrates. This should not be of concern to him. He verbalized understanding.    Diabetes mellitus type 2, uncontrolled, with cardiovascular complications   Last Assessment & Plan   03/07/2013  Office  Visit Written 03/07/2013  9:38 AM by Satira Sark, MD     Now followed at the [facility] in [place].    Tobacco abuse   H/O medication noncompliance   Cocaine abuse   Last Assessment & Plan   03/07/2013 Office Visit Written 03/07/2013  9:38 AM by Satira Sark, MD     Patient states that he has not used any cocaine since April.    Unstable angina   CAD (coronary artery disease), native coronary artery   Last Assessment & Plan   03/07/2013 Office Visit Written 03/07/2013  9:37 AM by Satira Sark, MD     Progressive angina symptoms as outlined. Patient has documented CAD, revascularization has not been pursued with previously discussed concerns about compliance and also cocaine use. Mr. Fuhrmann tells me that he has not used any cocaine since April and has been taking his medicines regularly. Despite this he continues to have accelerating angina. I have reviewed the records and see that Dr. Burt Knack reviewed the patient's films at the last admission. Plan at this time is to increase Imdur to 30 mg twice daily. I also plan to speak with Dr. Burt Knack today and likely schedule the patient for a repeat cardiac catheterization with tentative PCI this week if appropriate.    Generalized anxiety disorder   Lipids abnormal   Last Assessment & Plan   03/07/2013 Office Visit Written 03/07/2013  9:39 AM by Satira Sark, MD     Recent LDL 86 on statin therapy.    Coronary atherosclerosis of native coronary artery   Last Assessment & Plan   03/24/2013 Office Visit Written 03/24/2013  2:09 PM by Lendon Colonel, NP     He is doing well s/p PCI to LAD. He is shown the stent video while here in the office. I will d/c Nexium and change him to protonix in the setting of use of DAPT with Plavix. He is to be established with cardiac rehab. We will see him in 4 months.    Type 2 diabetes mellitus   Substance abuse   Hyperlipidemia   Last Assessment & Plan   03/24/2013 Office Visit Written  03/24/2013  2:09 PM by Lendon Colonel, NP     Continue on statin therapy. He will have lipids and LFT';s checked in 3-4 months.        Imaging: No results found.

## 2013-07-28 NOTE — Assessment & Plan Note (Signed)
He continues to have angina, but is not taking isosorbide but once a day. He is advised to take it BID as directed. He is advised to quit smoking. I will not order any diagnostic tests at this time.

## 2013-08-08 ENCOUNTER — Telehealth: Payer: Self-pay | Admitting: Adult Health

## 2013-08-08 NOTE — Telephone Encounter (Signed)
Patient notified that  Imdur delivered today  [facility] states they will not pay for Prilosec,only Nexium. Taken off Nexium because he is on Plavix

## 2013-08-08 NOTE — Telephone Encounter (Signed)
Needs about a 10 day supply of Isosorbide new dosage sent to [facility].  And please make sure new script was sent to [facility].  States that Nexium was stopped and was told to start Prilosec.  States that Pharmacy will not supply that one due to being able to get it over the counter.  Would like something else prescribed and sent to [facility]. / tgs

## 2013-08-09 NOTE — Telephone Encounter (Signed)
This nurse found over the counter brand at [place] for $4. Name Equate Ranitidine acid reducer 150 mg 65ct. Pt was made aware and grateful.

## 2013-08-09 NOTE — Telephone Encounter (Signed)
Pt states that he cant afford any of the over the counter.

## 2013-08-09 NOTE — Telephone Encounter (Signed)
He will have to take the OTC prilosec or Zantac. No Rx for other PPI.

## 2013-11-14 ENCOUNTER — Encounter: Payer: Self-pay | Admitting: Adult Health

## 2013-11-14 ENCOUNTER — Ambulatory Visit (INDEPENDENT_AMBULATORY_CARE_PROVIDER_SITE_OTHER): Payer: Self-pay | Admitting: Adult Health

## 2013-11-14 VITALS — BP 130/66 | HR 81 | Ht 70.0 in | Wt 245.0 lb

## 2013-11-14 DIAGNOSIS — R109 Unspecified abdominal pain: Secondary | ICD-10-CM

## 2013-11-14 DIAGNOSIS — I10 Essential (primary) hypertension: Secondary | ICD-10-CM

## 2013-11-14 MED ORDER — ISOSORBIDE DINITRATE 30 MG PO TABS
ORAL_TABLET | ORAL | Status: DC
Start: 2013-11-14 — End: 2013-11-22

## 2013-11-14 NOTE — Assessment & Plan Note (Signed)
On examination abdomen, he does have a significant pain in his right upper quadrant, with radiation into the back. It appears he has has a positive Murphy sign. He continues to have discomfort after stopping. With some rebound pain after pressure.  Our gallbladder ultrasound for evaluation of cholelithiasis. He will have a copy sent to his primary care provider Soyla Dryer physician assistant. A hiatus scan may be also an option to pursue if no cholelithiasis are noted.

## 2013-11-14 NOTE — Patient Instructions (Addendum)
Your physician recommends that you schedule a follow-up appointment in: 3 months   Your physician has recommended you make the following change in your medication:    Increase Isosorbide to 60 mg twice a day   Please limit the amount of nitroglycerin you are taking  Please have Ultrasound of abdomen No food for 6 hrs before,water and medications OK to take per Richard in [place]

## 2013-11-14 NOTE — Progress Notes (Signed)
HPI: [name] is a 58 year old patient of Dr. Domenic Polite we follow for ongoing assessment and management of CAD, history of tobacco abuse and cocaine abuse, who was last in our office in January 2015.     His recent cardiac catheterization was in August of 2014 which demonstrated LAD stenosis with PCI using drug-eluting stent form otherwise nonobstructive CAD with preserved LV function he was placed on DAPT. He is here for six-month followup. Unfortunately continues to smoke on last visit.   He comes today complaining of muscle aches and pain, right arm pain, neck pain, and chest discomfort with any exertion. He is taking several NTG a day, which he states makes him feel much better. Does not exercise due to shortness of breath and chest discomfort.      No Known Allergies  Current Outpatient Prescriptions  Medication Sig Dispense Refill  . aspirin 81 MG chewable tablet Chew 1 tablet (81 mg total) by mouth daily.  30 tablet  2  . clopidogrel (PLAVIX) 75 MG tablet Take 1 tablet (75 mg total) by mouth daily with breakfast.  30 tablet  5  . diltiazem (CARDIZEM CD) 240 MG 24 hr capsule Take 1 capsule (240 mg total) by mouth daily.  30 capsule  2  . gabapentin (NEURONTIN) 100 MG capsule Take 300 mg by mouth at bedtime.       [name] glipiZIDE (GLUCOTROL) 2.5 mg TABS Take 0.5 tablets (2.5 mg total) by mouth 2 (two) times daily before a meal.  60 tablet  0  . isosorbide dinitrate (ISORDIL) 30 MG tablet Take 30 mg by mouth 2 (two) times daily.      [name] lisinopril (PRINIVIL,ZESTRIL) 5 MG tablet Take 5 mg by mouth daily.      . nitroGLYCERIN (NITROSTAT) 0.4 MG SL tablet Place 1 tablet (0.4 mg total) under the tongue every 5 (five) minutes as needed for chest pain.  30 tablet  1  . ranitidine (ZANTAC) 300 MG tablet Take 300 mg by mouth at bedtime.      . rosuvastatin (CRESTOR) 20 MG tablet Take 20 mg by mouth at bedtime.      . sitaGLIPtin (JANUVIA) 100 MG tablet Take 100 mg by mouth daily.      . fish  oil-omega-3 fatty acids 1000 MG capsule Take 1 g by mouth 4 (four) times daily.       No current facility-administered medications for this visit.    Past Medical History  Diagnosis Date  . Hypertension   . TIA (transient ischemic attack) 1997    No residual neurological deficits.  . Hepatitis Late 1970s    Treated in the hospital, finished treatment course. No recurrence  since  . Noncompliance   . Coronary atherosclerosis of native coronary artery     a. 02/2013 Cath/PCI: LM nl, LAD: 50p, 4m (2.5x16 promus DES), LCX nl, OM1 min irregs, LPL/LPDA diff dzs, RCA nondom, mod diff dzs, EF 55%.  [name] COPD (chronic obstructive pulmonary disease)   . Hyperlipidemia   . History of pneumonia     "3-4 times" (03/09/2013)  . Type 2 diabetes mellitus 2011  . GERD (gastroesophageal reflux disease)   . Arthritis     "hands, knees" (03/09/2013)  . Substance abuse     Past Surgical History  Procedure Laterality Date  . Cardiac catheterization  05/2012  . Coronary angioplasty with stent placement  03/09/2013    "1" (03/09/2013)  . Appendectomy  1970's    ROS: Review of  systems complete and found to be negative unless listed above  PHYSICAL EXAM BP 130/66  Pulse 81  Ht 5\' 10"  (1.778 m)  Wt 245 lb (111.131 kg)  BMI 35.15 kg/m2 General: Well developed, well nourished, in no acute distress, obese.  Head: Eyes PERRLA, No xanthomas.   Normal cephalic and atramatic  Lungs: Clear bilaterally to auscultation and percussion. Heart: HRRR S1 S2, without MRG.  Pulses are 2+ & equal.            No carotid bruit. No JVD.  No abdominal bruits. No femoral bruits. Abdomen: Bowel sounds are positive, abdomen soft . POSITIVE MURPHY'S SIGN. Msk:  Back normal, normal gait. Normal strength and tone for age. Extremities: No clubbing, cyanosis or edema.  DP +1 Neuro: Alert and oriented X 3. Psych:  Good affect, responds appropriately   EKG: NSR rate of 84 bpm.   ASSESSMENT AND PLAN

## 2013-11-14 NOTE — Progress Notes (Deleted)
Name: [name]    DOB: [birth year]  Age: 58 y.o.  MR#: [id number]       PCP:  Jacqualine Mau, PA-C      Insurance: Payor: / No coverage found.  CC:    Chief Complaint  Patient presents with  . Coronary Artery Disease    VS Filed Vitals:   11/14/13 1518  BP: 130/66  Pulse: 81  Height: 5\' 10"  (1.778 m)  Weight: 245 lb (111.131 kg)    Weights Current Weight  11/14/13 245 lb (111.131 kg)  07/28/13 242 lb (109.77 kg)  03/24/13 243 lb (110.224 kg)    Blood Pressure  BP Readings from Last 3 Encounters:  11/14/13 130/66  07/28/13 124/54  03/24/13 135/73     Admit date:  (Not on file) Last encounter with RMR:  08/08/2013   Allergy Review of patient's allergies indicates no known allergies.  Current Outpatient Prescriptions  Medication Sig Dispense Refill  . aspirin 81 MG chewable tablet Chew 1 tablet (81 mg total) by mouth daily.  30 tablet  2  . clopidogrel (PLAVIX) 75 MG tablet Take 1 tablet (75 mg total) by mouth daily with breakfast.  30 tablet  5  . diltiazem (CARDIZEM CD) 240 MG 24 hr capsule Take 1 capsule (240 mg total) by mouth daily.  30 capsule  2  . gabapentin (NEURONTIN) 100 MG capsule Take 300 mg by mouth at bedtime.       [name] glipiZIDE (GLUCOTROL) 2.5 mg TABS Take 0.5 tablets (2.5 mg total) by mouth 2 (two) times daily before a meal.  60 tablet  0  . isosorbide dinitrate (ISORDIL) 30 MG tablet Take 30 mg by mouth 2 (two) times daily.      [name] lisinopril (PRINIVIL,ZESTRIL) 5 MG tablet Take 5 mg by mouth daily.      . nitroGLYCERIN (NITROSTAT) 0.4 MG SL tablet Place 1 tablet (0.4 mg total) under the tongue every 5 (five) minutes as needed for chest pain.  30 tablet  1  . ranitidine (ZANTAC) 300 MG tablet Take 300 mg by mouth at bedtime.      . rosuvastatin (CRESTOR) 20 MG tablet Take 20 mg by mouth at bedtime.      . sitaGLIPtin (JANUVIA) 100 MG tablet Take 100 mg by mouth daily.      . fish oil-omega-3 fatty acids 1000 MG capsule Take 1 g by mouth 4 (four)  times daily.       No current facility-administered medications for this visit.    Discontinued Meds:    Medications Discontinued During This Encounter  Medication Reason  . omeprazole (PRILOSEC) 20 MG capsule Error  . quinapril (ACCUPRIL) 5 MG tablet Error    Patient Active Problem List   Diagnosis Date Noted  . Coronary atherosclerosis of native coronary artery   . Type 2 diabetes mellitus   . Substance abuse   . Hyperlipidemia   . CAD (coronary artery disease), native coronary artery 11/04/2012  . Generalized anxiety disorder 11/04/2012  . Lipids abnormal 11/04/2012  . Diabetes mellitus type 2, uncontrolled, with cardiovascular complications 07/37/1062  . Tobacco abuse 06/01/2012  . H/O medication noncompliance 06/01/2012  . Cocaine abuse 06/01/2012  . Unstable angina 06/01/2012  . Essential hypertension, benign     LABS    Component Value Date/Time   NA 135 03/10/2013 0500   NA 137 03/09/2013 0400   NA 139 03/09/2013 0146   K 4.2 03/10/2013 0500   K 4.6 03/09/2013 0400  K 4.1 03/09/2013 0146   CL 102 03/10/2013 0500   CL 104 03/09/2013 0400   CL 106 03/09/2013 0146   CO2 22 03/10/2013 0500   CO2 22 03/09/2013 0400   CO2 24 03/09/2013 0146   GLUCOSE 213* 03/10/2013 0500   GLUCOSE 164* 03/09/2013 0400   GLUCOSE 169* 03/09/2013 0146   BUN 12 03/10/2013 0500   BUN 14 03/09/2013 0400   BUN 15 03/09/2013 0146   CREATININE 1.02 03/10/2013 0500   CREATININE 1.03 03/09/2013 0400   CREATININE 1.10 03/09/2013 0146   CALCIUM 8.8 03/10/2013 0500   CALCIUM 8.9 03/09/2013 0400   CALCIUM 8.7 03/09/2013 0146   GFRNONAA 80* 03/10/2013 0500   GFRNONAA 79* 03/09/2013 0400   GFRNONAA 73* 03/09/2013 0146   GFRAA >90 03/10/2013 0500   GFRAA >90 03/09/2013 0400   GFRAA 84* 03/09/2013 0146   CMP     Component Value Date/Time   NA 135 03/10/2013 0500   K 4.2 03/10/2013 0500   CL 102 03/10/2013 0500   CO2 22 03/10/2013 0500   GLUCOSE 213* 03/10/2013 0500   BUN 12 03/10/2013 0500   CREATININE 1.02  03/10/2013 0500   CALCIUM 8.8 03/10/2013 0500   PROT 6.5 03/09/2013 0146   ALBUMIN 2.9* 03/09/2013 0146   AST 11 03/09/2013 0146   ALT 13 03/09/2013 0146   ALKPHOS 77 03/09/2013 0146   BILITOT 0.2* 03/09/2013 0146   GFRNONAA 80* 03/10/2013 0500   GFRAA >90 03/10/2013 0500       Component Value Date/Time   WBC 8.7 03/10/2013 0500   WBC 10.7* 03/09/2013 0400   WBC 10.9* 03/08/2013 2222   HGB 13.0 03/10/2013 0500   HGB 13.2 03/09/2013 0400   HGB 13.9 03/08/2013 2245   HCT 37.8* 03/10/2013 0500   HCT 38.9* 03/09/2013 0400   HCT 41.0 03/08/2013 2245   MCV 83.4 03/10/2013 0500   MCV 84.0 03/09/2013 0400   MCV 82.8 03/08/2013 2222    Lipid Panel     Component Value Date/Time   CHOL 115 11/04/2012 0425   TRIG 234* 11/04/2012 0425   HDL 23* 11/04/2012 0425   CHOLHDL 5.0 11/04/2012 0425   VLDL 47* 11/04/2012 0425   LDLCALC 45 11/04/2012 0425    ABG    Component Value Date/Time   TCO2 22 03/08/2013 2245     Lab Results  Component Value Date   TSH 1.020 06/01/2012   BNP (last 3 results)  Recent Labs  03/09/13 0047  PROBNP 41.1   Cardiac Panel (last 3 results) No results found for this basename: CKTOTAL, CKMB, TROPONINI, RELINDX,  in the last 72 hours  Iron/TIBC/Ferritin No results found for this basename: iron, tibc, ferritin     EKG Orders placed in visit on 03/24/13  . EKG 12-LEAD     Prior Assessment and Plan Problem List as of 11/14/2013     Cardiovascular and Mediastinum   Essential hypertension, benign   Last Assessment & Plan   03/24/2013 Office Visit Written 03/24/2013  2:06 PM by Lendon Colonel, NP     Good control at present. I explained to him that he may notice some ankle edema by the end of the day with use of CCB and nitrates. This should not be of concern to him. He verbalized understanding.    Unstable angina   Last Assessment & Plan   07/28/2013 Office Visit Written 07/28/2013  2:49 PM by Lendon Colonel, NP     He continues to  have angina, but is not taking  isosorbide but once a day. He is advised to take it BID as directed. He is advised to quit smoking. I will not order any diagnostic tests at this time.     CAD (coronary artery disease), native coronary artery   Last Assessment & Plan   03/07/2013 Office Visit Written 03/07/2013  9:37 AM by Satira Sark, MD     Progressive angina symptoms as outlined. Patient has documented CAD, revascularization has not been pursued with previously discussed concerns about compliance and also cocaine use. Mr. Grobe tells me that he has not used any cocaine since April and has been taking his medicines regularly. Despite this he continues to have accelerating angina. I have reviewed the records and see that Dr. Burt Knack reviewed the patient's films at the last admission. Plan at this time is to increase Imdur to 30 mg twice daily. I also plan to speak with Dr. Burt Knack today and likely schedule the patient for a repeat cardiac catheterization with tentative PCI this week if appropriate.    Coronary atherosclerosis of native coronary artery   Last Assessment & Plan   07/28/2013 Office Visit Written 07/28/2013  2:51 PM by Lendon Colonel, NP     He is otherwise doing well. I have advised him to quit smoking as this is will significantly contribute to progression of CAD and ISR. He verbalizes understanding.      Endocrine   Diabetes mellitus type 2, uncontrolled, with cardiovascular complications   Last Assessment & Plan   03/07/2013 Office Visit Written 03/07/2013  9:38 AM by Satira Sark, MD     Now followed at the [facility] in [place].    Type 2 diabetes mellitus   Last Assessment & Plan   07/28/2013 Office Visit Written 07/28/2013  2:52 PM by Lendon Colonel, NP     Has not been well controlled He is referred back [facility] with recommendations to have him seen by Endocrinologist.      Other   Tobacco abuse   H/O medication noncompliance   Cocaine abuse   Last Assessment & Plan   03/07/2013  Office Visit Written 03/07/2013  9:38 AM by Satira Sark, MD     Patient states that he has not used any cocaine since April.    Generalized anxiety disorder   Lipids abnormal   Last Assessment & Plan   03/07/2013 Office Visit Written 03/07/2013  9:39 AM by Satira Sark, MD     Recent LDL 86 on statin therapy.    Substance abuse   Hyperlipidemia   Last Assessment & Plan   03/24/2013 Office Visit Written 03/24/2013  2:09 PM by Lendon Colonel, NP     Continue on statin therapy. He will have lipids and LFT';s checked in 3-4 months.        Imaging: No results found.

## 2013-11-14 NOTE — Assessment & Plan Note (Signed)
Blood glucose is been slightly elevated. He is to have lab work completed her primary care provider next week. He may be having a lot of neurologic pains he states his his arm and his neck and "the veins in my neck" hurts.  Titration of gabapentin may be necessary but we will defer to primary care provider.

## 2013-11-14 NOTE — Assessment & Plan Note (Signed)
He is to take extra doses of nitroglycerin, and states he cannot walk or do anything exertional without causing chest pain. States the pain is the same kind of pain he had prior to having stents placed. He also is becoming more sedentary and is gaining weight.  I reviewed his EKG, there is no evidence of ACS,. I have advised him that we will increase his isosorbide dinitrate to 60 mg twice a day. I have advised him not to take the sublingual nitroglycerin unless he is having severe discomfort. Due to want to drop his blood pressure and cause a syncopal episode. Do not plan on following up with a stress test at this time.

## 2013-11-22 ENCOUNTER — Telehealth: Payer: Self-pay | Admitting: Adult Health

## 2013-11-22 ENCOUNTER — Encounter: Payer: Self-pay | Admitting: Cardiology

## 2013-11-22 ENCOUNTER — Ambulatory Visit (HOSPITAL_COMMUNITY)
Admission: RE | Admit: 2013-11-22 | Discharge: 2013-11-22 | Disposition: A | Discharge status: Home / Self Care | Payer: Medicaid Other | Source: Ambulatory Visit | Attending: Adult Health | Admitting: Adult Health

## 2013-11-22 DIAGNOSIS — R109 Unspecified abdominal pain: Secondary | ICD-10-CM

## 2013-11-22 DIAGNOSIS — K7689 Other specified diseases of liver: Secondary | ICD-10-CM | POA: Insufficient documentation

## 2013-11-22 DIAGNOSIS — R1011 Right upper quadrant pain: Secondary | ICD-10-CM | POA: Diagnosis present

## 2013-11-22 DIAGNOSIS — K828 Other specified diseases of gallbladder: Secondary | ICD-10-CM | POA: Insufficient documentation

## 2013-11-22 MED ORDER — ISOSORBIDE DINITRATE 30 MG PO TABS: ORAL_TABLET | ORAL | Status: DC

## 2013-11-22 NOTE — Telephone Encounter (Signed)
Medication sent to [facility]

## 2013-11-22 NOTE — Telephone Encounter (Signed)
Patient needs RX on Isosorbide sent to [facility] / tgs

## 2013-12-04 ENCOUNTER — Telehealth: Payer: Self-pay | Admitting: Adult Health

## 2013-12-04 NOTE — Telephone Encounter (Signed)
Please call patient regarding test results and medication changes. / tgs

## 2013-12-05 ENCOUNTER — Other Ambulatory Visit: Payer: Self-pay

## 2013-12-05 MED ORDER — ISOSORBIDE DINITRATE 30 MG PO TABS
ORAL_TABLET | ORAL | Status: DC
Start: 2013-12-05 — End: 2013-12-18

## 2013-12-05 NOTE — Telephone Encounter (Signed)
I spoke with Alyse Low at the [facility] and she spoke with Soyla Dryer PA-C and stated they willcall patient and make him a follow up appt with them  I spoke with patient,he needed refills for isosorbide since dose was doubled from 30 mg bid to 60 mg bid.He said [facility] made him a follow up appointment

## 2013-12-05 NOTE — Telephone Encounter (Signed)
Unable to reach pt, no answer, no answering machine  I spoke with Maudie Mercury,  LPN at [facility] to inquire if any follow up had been done regarding pt's ultrasound of RUQ .Kim states [employer] PA-C did receive the test results and Curt Bears Lawrence's office note suggesting further follow testing to be done by pcp. They have not seen patient since receiving our information from testing/office visit    I will route this message to K.Lawrence NP for further instructions

## 2013-12-05 NOTE — Telephone Encounter (Signed)
Please have patient make appt with [facility] to follow up.

## 2013-12-07 ENCOUNTER — Telehealth: Payer: Self-pay | Admitting: *Deleted

## 2013-12-07 NOTE — Telephone Encounter (Signed)
Please correct the pharmacy on this patient we keep faxing it to a office not a pharmacy. They have received X3. Please call patient and verify who it should be going to and fix in they system. Thank you.

## 2013-12-07 NOTE — Telephone Encounter (Signed)
Called [facility] about isosorbide and they said that patient needs to recertify before they will refill medication. Also stated that they will only approve Imdur. Notified patient he will call when he gets recertification so that we can refill medication.

## 2013-12-14 ENCOUNTER — Other Ambulatory Visit (HOSPITAL_COMMUNITY): Payer: Self-pay | Admitting: Physician Assistant

## 2013-12-14 DIAGNOSIS — R9389 Abnormal findings on diagnostic imaging of other specified body structures: Secondary | ICD-10-CM

## 2013-12-14 DIAGNOSIS — K828 Other specified diseases of gallbladder: Secondary | ICD-10-CM

## 2013-12-18 ENCOUNTER — Inpatient Hospital Stay (HOSPITAL_COMMUNITY)
Admission: EM | Admit: 2013-12-18 | Discharge: 2013-12-23 | DRG: 419 | Disposition: A | Discharge status: Home / Self Care | Payer: Medicaid Other | Attending: Cardiovascular Disease | Admitting: Cardiovascular Disease

## 2013-12-18 ENCOUNTER — Encounter (HOSPITAL_COMMUNITY): Payer: Self-pay | Admitting: Emergency Medicine

## 2013-12-18 ENCOUNTER — Emergency Department (HOSPITAL_COMMUNITY): Payer: Medicaid Other

## 2013-12-18 DIAGNOSIS — F17200 Nicotine dependence, unspecified, uncomplicated: Secondary | ICD-10-CM | POA: Diagnosis present

## 2013-12-18 DIAGNOSIS — Z91199 Patient's noncompliance with other medical treatment and regimen due to unspecified reason: Secondary | ICD-10-CM

## 2013-12-18 DIAGNOSIS — K810 Acute cholecystitis: Secondary | ICD-10-CM

## 2013-12-18 DIAGNOSIS — Z7982 Long term (current) use of aspirin: Secondary | ICD-10-CM

## 2013-12-18 DIAGNOSIS — Z8673 Personal history of transient ischemic attack (TIA), and cerebral infarction without residual deficits: Secondary | ICD-10-CM

## 2013-12-18 DIAGNOSIS — I2510 Atherosclerotic heart disease of native coronary artery without angina pectoris: Secondary | ICD-10-CM | POA: Diagnosis present

## 2013-12-18 DIAGNOSIS — K219 Gastro-esophageal reflux disease without esophagitis: Secondary | ICD-10-CM | POA: Diagnosis present

## 2013-12-18 DIAGNOSIS — Z9119 Patient's noncompliance with other medical treatment and regimen: Secondary | ICD-10-CM

## 2013-12-18 DIAGNOSIS — R109 Unspecified abdominal pain: Secondary | ICD-10-CM

## 2013-12-18 DIAGNOSIS — E1165 Type 2 diabetes mellitus with hyperglycemia: Secondary | ICD-10-CM

## 2013-12-18 DIAGNOSIS — Z720 Tobacco use: Secondary | ICD-10-CM | POA: Diagnosis present

## 2013-12-18 DIAGNOSIS — R079 Chest pain, unspecified: Secondary | ICD-10-CM

## 2013-12-18 DIAGNOSIS — F1910 Other psychoactive substance abuse, uncomplicated: Secondary | ICD-10-CM

## 2013-12-18 DIAGNOSIS — K7689 Other specified diseases of liver: Secondary | ICD-10-CM | POA: Diagnosis present

## 2013-12-18 DIAGNOSIS — Z9861 Coronary angioplasty status: Secondary | ICD-10-CM

## 2013-12-18 DIAGNOSIS — F1410 Cocaine abuse, uncomplicated: Secondary | ICD-10-CM | POA: Diagnosis present

## 2013-12-18 DIAGNOSIS — N289 Disorder of kidney and ureter, unspecified: Secondary | ICD-10-CM | POA: Diagnosis present

## 2013-12-18 DIAGNOSIS — E118 Type 2 diabetes mellitus with unspecified complications: Secondary | ICD-10-CM

## 2013-12-18 DIAGNOSIS — Z0289 Encounter for other administrative examinations: Secondary | ICD-10-CM

## 2013-12-18 DIAGNOSIS — IMO0002 Reserved for concepts with insufficient information to code with codable children: Secondary | ICD-10-CM

## 2013-12-18 DIAGNOSIS — Z9114 Patient's other noncompliance with medication regimen: Secondary | ICD-10-CM

## 2013-12-18 DIAGNOSIS — M129 Arthropathy, unspecified: Secondary | ICD-10-CM | POA: Diagnosis present

## 2013-12-18 DIAGNOSIS — E785 Hyperlipidemia, unspecified: Secondary | ICD-10-CM | POA: Diagnosis present

## 2013-12-18 DIAGNOSIS — N2889 Other specified disorders of kidney and ureter: Secondary | ICD-10-CM

## 2013-12-18 DIAGNOSIS — K8000 Calculus of gallbladder with acute cholecystitis without obstruction: Principal | ICD-10-CM | POA: Diagnosis present

## 2013-12-18 DIAGNOSIS — I252 Old myocardial infarction: Secondary | ICD-10-CM

## 2013-12-18 DIAGNOSIS — I10 Essential (primary) hypertension: Secondary | ICD-10-CM | POA: Diagnosis present

## 2013-12-18 DIAGNOSIS — J449 Chronic obstructive pulmonary disease, unspecified: Secondary | ICD-10-CM | POA: Diagnosis present

## 2013-12-18 DIAGNOSIS — J4489 Other specified chronic obstructive pulmonary disease: Secondary | ICD-10-CM | POA: Diagnosis present

## 2013-12-18 DIAGNOSIS — R0789 Other chest pain: Secondary | ICD-10-CM | POA: Diagnosis present

## 2013-12-18 DIAGNOSIS — R1011 Right upper quadrant pain: Secondary | ICD-10-CM

## 2013-12-18 DIAGNOSIS — E119 Type 2 diabetes mellitus without complications: Secondary | ICD-10-CM | POA: Diagnosis present

## 2013-12-18 LAB — BASIC METABOLIC PANEL
BUN: 13 mg/dL (ref 6–23)
CALCIUM: 9.4 mg/dL (ref 8.4–10.5)
CO2: 23 meq/L (ref 19–32)
Chloride: 99 mEq/L (ref 96–112)
Creatinine, Ser: 0.98 mg/dL (ref 0.50–1.35)
GFR calc Af Amer: >90 mL/min (ref >90)
GFR calc non Af Amer: 89 mL/min — ABNORMAL LOW (ref >90)
Glucose, Bld: 190 mg/dL — ABNORMAL HIGH (ref 70–99)
Potassium: 4.2 mEq/L (ref 3.7–5.3)
Sodium: 137 mEq/L (ref 137–147)

## 2013-12-18 LAB — I-STAT TROPONIN, ED: TROPONIN I, POC: 0 ng/mL (ref 0.00–0.08)

## 2013-12-18 LAB — CBC
HCT: 45.4 % (ref 39.0–52.0)
Hemoglobin: 15.5 g/dL (ref 13.0–17.0)
MCH: 28.7 pg (ref 26.0–34.0)
MCHC: 34.1 g/dL (ref 30.0–36.0)
MCV: 83.9 fL (ref 78.0–100.0)
PLATELETS: 228 10*3/uL (ref 150–400)
RBC: 5.41 MIL/uL (ref 4.22–5.81)
RDW: 14.3 % (ref 11.5–15.5)
WBC: 11.3 10*3/uL — AB (ref 4.0–10.5)

## 2013-12-18 LAB — MRSA PCR SCREENING: MRSA by PCR: NEGATIVE

## 2013-12-18 LAB — RAPID URINE DRUG SCREEN, HOSP PERFORMED
Amphetamines: NOT DETECTED
BARBITURATES: NOT DETECTED
Benzodiazepines: NOT DETECTED
COCAINE: POSITIVE — AB
Opiates: NOT DETECTED
Tetrahydrocannabinol: NOT DETECTED

## 2013-12-18 LAB — PRO B NATRIURETIC PEPTIDE: Pro B Natriuretic peptide (BNP): 54.7 pg/mL (ref 0–125)

## 2013-12-18 LAB — GLUCOSE, CAPILLARY: Glucose-Capillary: 159 mg/dL — ABNORMAL HIGH (ref 70–99)

## 2013-12-18 LAB — TROPONIN I: Troponin I: <0.3 ng/mL (ref <0.30)

## 2013-12-18 LAB — TSH: TSH: 0.331 u[IU]/mL — AB (ref 0.350–4.500)

## 2013-12-18 LAB — HEPARIN LEVEL (UNFRACTIONATED): Heparin Unfractionated: 0.43 IU/mL (ref 0.30–0.70)

## 2013-12-18 MED ORDER — NITROGLYCERIN IN D5W 200-5 MCG/ML-% IV SOLN
3.0000 ug/min | INTRAVENOUS | Status: DC
  Administered 2013-12-18: 3 ug/min via INTRAVENOUS
  Filled 2013-12-18: qty 250

## 2013-12-18 MED ORDER — ATORVASTATIN CALCIUM 40 MG PO TABS
40.0000 mg | ORAL_TABLET | Freq: Every day | ORAL | Status: DC
  Administered 2013-12-19 – 2013-12-22 (×4): 40 mg via ORAL
  Filled 2013-12-18 (×5): qty 1

## 2013-12-18 MED ORDER — SODIUM CHLORIDE 0.9 % IV SOLN: 250.0000 mL | INTRAVENOUS | Status: DC | PRN

## 2013-12-18 MED ORDER — SODIUM CHLORIDE 0.9 % IJ SOLN: 3.0000 mL | INTRAMUSCULAR | Status: DC | PRN

## 2013-12-18 MED ORDER — HEPARIN BOLUS VIA INFUSION
4000.0000 [IU] | Freq: Once | INTRAVENOUS | Status: AC
  Administered 2013-12-18: 4000 [IU] via INTRAVENOUS
  Filled 2013-12-18: qty 4000

## 2013-12-18 MED ORDER — OMEGA-3-ACID ETHYL ESTERS 1 G PO CAPS
1.0000 g | ORAL_CAPSULE | Freq: Two times a day (BID) | ORAL | Status: DC
  Administered 2013-12-20 – 2013-12-23 (×5): 1 g via ORAL
  Filled 2013-12-18 (×10): qty 1

## 2013-12-18 MED ORDER — INSULIN ASPART 100 UNIT/ML ~~LOC~~ SOLN
0.0000 [IU] | Freq: Three times a day (TID) | SUBCUTANEOUS | Status: DC
  Administered 2013-12-19 – 2013-12-20 (×2): 3 [IU] via SUBCUTANEOUS
  Administered 2013-12-21: 5 [IU] via SUBCUTANEOUS
  Administered 2013-12-21: 3 [IU] via SUBCUTANEOUS
  Administered 2013-12-23: 5 [IU] via SUBCUTANEOUS
  Administered 2013-12-23: 3 [IU] via SUBCUTANEOUS

## 2013-12-18 MED ORDER — GLIPIZIDE 2.5 MG HALF TABLET
2.5000 mg | ORAL_TABLET | Freq: Two times a day (BID) | ORAL | Status: DC
  Administered 2013-12-20 – 2013-12-23 (×5): 2.5 mg via ORAL
  Filled 2013-12-18 (×11): qty 1

## 2013-12-18 MED ORDER — HEPARIN (PORCINE) IN NACL 100-0.45 UNIT/ML-% IJ SOLN
1900.0000 [IU]/h | INTRAMUSCULAR | Status: DC
  Administered 2013-12-18 – 2013-12-20 (×4): 1900 [IU]/h via INTRAVENOUS
  Filled 2013-12-18 (×5): qty 250

## 2013-12-18 MED ORDER — OMEGA-3 FATTY ACIDS 1000 MG PO CAPS: 1.0000 g | ORAL_CAPSULE | Freq: Four times a day (QID) | ORAL | Status: DC

## 2013-12-18 MED ORDER — LINAGLIPTIN 5 MG PO TABS
5.0000 mg | ORAL_TABLET | Freq: Every day | ORAL | Status: DC
  Administered 2013-12-21 – 2013-12-23 (×3): 5 mg via ORAL
  Filled 2013-12-18 (×5): qty 1

## 2013-12-18 MED ORDER — NITROGLYCERIN 2 % TD OINT
1.0000 [in_us] | TOPICAL_OINTMENT | Freq: Once | TRANSDERMAL | Status: DC
  Administered 2013-12-18: 1 [in_us] via TOPICAL
  Filled 2013-12-18: qty 1

## 2013-12-18 MED ORDER — ONDANSETRON HCL 4 MG/2ML IJ SOLN
4.0000 mg | Freq: Once | INTRAMUSCULAR | Status: AC
  Administered 2013-12-18: 4 mg via INTRAVENOUS
  Filled 2013-12-18: qty 2

## 2013-12-18 MED ORDER — ATORVASTATIN CALCIUM 80 MG PO TABS: 80.0000 mg | ORAL_TABLET | Freq: Every day | ORAL | Status: DC

## 2013-12-18 MED ORDER — PANTOPRAZOLE SODIUM 40 MG PO TBEC
40.0000 mg | DELAYED_RELEASE_TABLET | Freq: Every day | ORAL | Status: DC
  Administered 2013-12-18 – 2013-12-23 (×4): 40 mg via ORAL
  Filled 2013-12-18 (×4): qty 1

## 2013-12-18 MED ORDER — DILTIAZEM HCL ER COATED BEADS 240 MG PO CP24
240.0000 mg | ORAL_CAPSULE | Freq: Every day | ORAL | Status: DC
  Administered 2013-12-18 – 2013-12-23 (×6): 240 mg via ORAL
  Filled 2013-12-18 (×6): qty 1

## 2013-12-18 MED ORDER — GABAPENTIN 300 MG PO CAPS
300.0000 mg | ORAL_CAPSULE | Freq: Every day | ORAL | Status: DC
  Administered 2013-12-18 – 2013-12-22 (×5): 300 mg via ORAL
  Filled 2013-12-18 (×6): qty 1

## 2013-12-18 MED ORDER — SODIUM CHLORIDE 0.9 % IV SOLN
1.0000 mL/kg/h | INTRAVENOUS | Status: DC
  Administered 2013-12-19 (×2): 1 mL/kg/h via INTRAVENOUS

## 2013-12-18 MED ORDER — NITROGLYCERIN 0.4 MG SL SUBL: 0.4000 mg | SUBLINGUAL_TABLET | SUBLINGUAL | Status: DC | PRN

## 2013-12-18 MED ORDER — ASPIRIN 81 MG PO CHEW
81.0000 mg | CHEWABLE_TABLET | ORAL | Status: AC
  Administered 2013-12-20: 81 mg via ORAL
  Filled 2013-12-18 (×2): qty 1

## 2013-12-18 MED ORDER — CLOPIDOGREL BISULFATE 75 MG PO TABS
75.0000 mg | ORAL_TABLET | Freq: Every day | ORAL | Status: DC
  Administered 2013-12-19 – 2013-12-20 (×2): 75 mg via ORAL
  Filled 2013-12-18 (×2): qty 1

## 2013-12-18 MED ORDER — SODIUM CHLORIDE 0.9 % IJ SOLN
3.0000 mL | Freq: Two times a day (BID) | INTRAMUSCULAR | Status: DC
  Administered 2013-12-19 – 2013-12-20 (×3): 3 mL via INTRAVENOUS

## 2013-12-18 MED ORDER — SODIUM CHLORIDE 0.9 % IJ SOLN
3.0000 mL | Freq: Two times a day (BID) | INTRAMUSCULAR | Status: DC
  Administered 2013-12-19 – 2013-12-23 (×7): 3 mL via INTRAVENOUS

## 2013-12-18 MED ORDER — ASPIRIN 81 MG PO CHEW
81.0000 mg | CHEWABLE_TABLET | Freq: Every day | ORAL | Status: DC
  Administered 2013-12-19 – 2013-12-23 (×4): 81 mg via ORAL
  Filled 2013-12-18 (×4): qty 1

## 2013-12-18 MED ORDER — BIOTENE DRY MOUTH MT LIQD
15.0000 mL | Freq: Two times a day (BID) | OROMUCOSAL | Status: DC
  Administered 2013-12-18 – 2013-12-21 (×5): 15 mL via OROMUCOSAL

## 2013-12-18 MED ORDER — LISINOPRIL 10 MG PO TABS
10.0000 mg | ORAL_TABLET | Freq: Every day | ORAL | Status: DC
  Administered 2013-12-19 – 2013-12-23 (×5): 10 mg via ORAL
  Filled 2013-12-18 (×5): qty 1

## 2013-12-18 NOTE — ED Notes (Signed)
Pt also c/o abd pain, sts he has been having this issue and told the doctor wanted to rule out any gallbladder problems. Pt sts he also has a liver tumor. Pt reports that he has hx of going into heart failure prior to having his stent placed. sts he normally doesn't take lasix but because he was feeling so sob this morning he took half of his mother lasix tablet because he thought it might help him. Pt reports he is very fatigued. Nad, skin warm and dry, resp e/u.

## 2013-12-18 NOTE — ED Notes (Addendum)
Nitro paste given. Unable to scan package.

## 2013-12-18 NOTE — Consult Note (Signed)
[place] for Heparin Indication: chest pain/ACS  No Known Allergies  Patient Measurements: Height: 5\' 9"  (175.3 cm) Weight: 239 lb 3.2 oz (108.5 kg) IBW/kg (Calculated) : 70.7 Heparin Dosing Weight: 94kg  Vital Signs: Temp: 97.9 F (36.6 C) (06/01 2006) Temp src: Oral (06/01 2006) BP: 154/88 mmHg (06/01 2200) Pulse Rate: 90 (06/01 2200)  Labs:  Recent Labs  12/18/13 1203 12/18/13 1950 12/18/13 2246  HGB 15.5  --   --   HCT 45.4  --   --   PLT 228  --   --   HEPARINUNFRC  --   --  0.43  CREATININE 0.98  --   --   TROPONINI  --  <0.30  --     Estimated Creatinine Clearance: 99.7 ml/min (by C-G formula based on Cr of 0.98).  Assessment: 58 y.o. male with chest pain for heparin   Goal of Therapy:  Heparin level 0.3-0.7 units/ml Monitor platelets by anticoagulation protocol: Yes   Plan:  Continue Heparin at current rate  Follow-up am labs.   Bronson Curb Eunice Winecoff 12/18/2013,11:24 PM

## 2013-12-18 NOTE — Consult Note (Signed)
ANTICOAGULATION CONSULT NOTE - Initial Consult  Pharmacy Consult for Heparin Indication: chest pain/ACS  No Known Allergies  Patient Measurements: Height: 5\' 9"  (175.3 cm) Weight: 238 lb (107.956 kg) IBW/kg (Calculated) : 70.7 Heparin Dosing Weight: 94kg  Vital Signs: Temp: 97.9 F (36.6 C) (06/01 1511) Temp src: Oral (06/01 1511) BP: 164/90 mmHg (06/01 1511) Pulse Rate: 69 (06/01 1500)  Labs:  Recent Labs  12/18/13 1203  HGB 15.5  HCT 45.4  PLT 228  CREATININE 0.98    Estimated Creatinine Clearance: 99.5 ml/min (by C-G formula based on Cr of 0.98).   Medical History: Past Medical History  Diagnosis Date  . Hypertension   . TIA (transient ischemic attack) 1997    No residual neurological deficits.  . Hepatitis Late 1970s    Treated in the hospital, finished treatment course. No recurrence  since  . Noncompliance   . Coronary atherosclerosis of native coronary artery     a. 02/2013 Cath/PCI: LM nl, LAD: 50p, 61m (2.5x16 promus DES), LCX nl, OM1 min irregs, LPL/LPDA diff dzs, RCA nondom, mod diff dzs, EF 55%.  [name] COPD (chronic obstructive pulmonary disease)   . Hyperlipidemia   . History of pneumonia     "3-4 times" (03/09/2013)  . Type 2 diabetes mellitus 2011  . GERD (gastroesophageal reflux disease)   . Arthritis     "hands, knees" (03/09/2013)  . Substance abuse   . Abdominal ultrasound, abnormal     a. 11/2013: 9cm RUQ complex cystic lesion in the area of the GB fossa. Diff hepatic steatosis. 1.8cm indeterminate hypoechoic lesion in L hepatic lobe-->Abd MRI w/wo contrast pending.    Medications:  No anticoagulants pta  Assessment: [name] with history of CAD s/p DES to LAD (02/2013) presents to the ED with chest pain. He will begin IV heparin. Baseline CBC and renal function ok.  Back in April of 2014, he was therapeutic on 1900 units/hr (similar weight and renal function).  Goal of Therapy:  Heparin level 0.3-0.7 units/ml Monitor platelets by  anticoagulation protocol: Yes   Plan:  1) Heparin bolus 4000 units x 1 2) Heparin drip at 1900 units/hr 3) Check 6 hour heparin level 4) Daily heparin level and CBC  Benjamine Sprague Azka Steger 12/18/2013,3:44 PM

## 2013-12-18 NOTE — ED Provider Notes (Signed)
CSN: [id number]     Arrival date & time 12/18/13  1134 History   First MD Initiated Contact with Patient 12/18/13 1135     Chief Complaint  Patient presents with  . Chest Pain     (Consider location/radiation/quality/duration/timing/severity/associated sxs/prior Treatment) Patient is a 58 y.o. male presenting with chest pain. The history is provided by the patient.  Chest Pain Pain location:  Substernal area Pain quality: crushing   Pain radiates to:  L shoulder and L arm Pain radiates to the back: no   Pain severity:  Moderate Onset quality:  Sudden Duration:  10 hours Timing:  Constant Progression:  Waxing and waning Chronicity:  New Context: at rest   Relieved by:  Nothing Worsened by:  Nothing tried Associated symptoms: shortness of breath   Associated symptoms: no abdominal pain, no back pain, no diaphoresis, no syncope, not vomiting and no weakness   Risk factors: coronary artery disease, high cholesterol and hypertension     Past Medical History  Diagnosis Date  . Hypertension   . TIA (transient ischemic attack) 1997    No residual neurological deficits.  . Hepatitis Late 1970s    Treated in the hospital, finished treatment course. No recurrence  since  . Noncompliance   . Coronary atherosclerosis of native coronary artery     a. 02/2013 Cath/PCI: LM nl, LAD: 50p, 59m (2.5x16 promus DES), LCX nl, OM1 min irregs, LPL/LPDA diff dzs, RCA nondom, mod diff dzs, EF 55%.  [name] COPD (chronic obstructive pulmonary disease)   . Hyperlipidemia   . History of pneumonia     "3-4 times" (03/09/2013)  . Type 2 diabetes mellitus 2011  . GERD (gastroesophageal reflux disease)   . Arthritis     "hands, knees" (03/09/2013)  . Substance abuse    Past Surgical History  Procedure Laterality Date  . Cardiac catheterization  05/2012  . Coronary angioplasty with stent placement  03/09/2013    "1" (03/09/2013)  . Appendectomy  1970's   Family History  Problem Relation Age of Onset  .  Hypertension    . Diabetes    . Stroke    . Cancer Father     Lung  . Cancer Mother     Thyroid  . Cancer Maternal Grandmother     Breast  . Cancer Maternal Grandfather     Throat and stomach  . CAD Father   . CAD Brother    History  Substance Use Topics  . Smoking status: Current Every Day Smoker -- 1.00 packs/day for 41 years    Types: Cigarettes  . Smokeless tobacco: Never Used  . Alcohol Use: 0.0 oz/week     Comment: 03/09/2013 "drank a glass of wine last month; last time before that was @ Christmas"    Review of Systems  Constitutional: Negative for diaphoresis.  Respiratory: Positive for shortness of breath.   Cardiovascular: Positive for chest pain. Negative for syncope.  Gastrointestinal: Negative for vomiting and abdominal pain.  Musculoskeletal: Negative for back pain.  Neurological: Negative for weakness.  All other systems reviewed and are negative.     Allergies  Review of patient's allergies indicates no known allergies.  Home Medications   Prior to Admission medications   Medication Sig Start Date End Date Taking? Authorizing Provider  acetaminophen (TYLENOL) 500 MG tablet Take 1,000 mg by mouth every 8 (eight) hours as needed for moderate pain.   Yes Historical Provider, MD  aspirin 81 MG chewable tablet Chew 1 tablet (81  mg total) by mouth daily. 11/05/12  Yes Reyne Dumas, MD  clopidogrel (PLAVIX) 75 MG tablet Take 1 tablet (75 mg total) by mouth daily with breakfast. 06/03/12  Yes Ivor Costa, MD  diltiazem (CARDIZEM CD) 240 MG 24 hr capsule Take 1 capsule (240 mg total) by mouth daily. 11/05/12  Yes Reyne Dumas, MD  fish oil-omega-3 fatty acids 1000 MG capsule Take 1 g by mouth 4 (four) times daily.   Yes Historical Provider, MD  gabapentin (NEURONTIN) 100 MG capsule Take 300 mg by mouth at bedtime.    Yes Historical Provider, MD  glipiZIDE (GLUCOTROL) 5 MG tablet Take 2.5 mg by mouth 2 (two) times daily before a meal.   Yes Historical Provider, MD   isosorbide dinitrate (ISORDIL) 30 MG tablet Take 60 mg by mouth 2 (two) times daily.   Yes Historical Provider, MD  lisinopril (PRINIVIL,ZESTRIL) 5 MG tablet Take 5 mg by mouth daily.   Yes Historical Provider, MD  nitroGLYCERIN (NITROSTAT) 0.4 MG SL tablet Place 1 tablet (0.4 mg total) under the tongue every 5 (five) minutes as needed for chest pain. 06/03/12  Yes Ivor Costa, MD  ranitidine (ZANTAC) 300 MG tablet Take 300 mg by mouth at bedtime.   Yes Historical Provider, MD  rosuvastatin (CRESTOR) 20 MG tablet Take 20 mg by mouth at bedtime.   Yes Historical Provider, MD  sitaGLIPtin (JANUVIA) 100 MG tablet Take 100 mg by mouth daily.   Yes Historical Provider, MD   BP 158/83  Temp(Src) 97.5 F (36.4 C) (Oral)  Resp 22  Ht 5\' 9"  (1.753 m)  Wt 238 lb (107.956 kg)  BMI 35.13 kg/m2  SpO2 89% Physical Exam  Constitutional: He is oriented to person, place, and time. He appears well-developed and well-nourished. No distress.  HENT:  Head: Normocephalic and atraumatic.  Eyes: Conjunctivae are normal.  Neck: Neck supple. No tracheal deviation present.  Cardiovascular: Normal rate, regular rhythm and normal heart sounds.   Pulmonary/Chest: Effort normal. No respiratory distress. He has rales in the right lower field and the left lower field.  Abdominal: Soft. He exhibits no distension.  Neurological: He is alert and oriented to person, place, and time.  Skin: Skin is warm and dry.  Psychiatric: He has a normal mood and affect.    ED Course  Procedures (including critical care time) Labs Review Labs Reviewed  CBC - Abnormal; Notable for the following:    WBC 11.3 (*)    All other components within normal limits  BASIC METABOLIC PANEL - Abnormal; Notable for the following:    Glucose, Bld 190 (*)    GFR calc non Af Amer 89 (*)    All other components within normal limits  URINE RAPID DRUG SCREEN (HOSP PERFORMED) - Abnormal; Notable for the following:    Cocaine POSITIVE (*)    All  other components within normal limits  PRO B NATRIURETIC PEPTIDE  I-STAT TROPOININ, ED    Imaging Review Dg Chest Port 1 View  12/18/2013   CLINICAL DATA:  Chest pain  EXAM: PORTABLE CHEST - 1 VIEW  COMPARISON:  March 08, 2013  FINDINGS: There is a small area of infiltrate in the right base. Elsewhere lungs are clear. Heart is mildly enlarged with normal pulmonary vascularity. No adenopathy. No bone lesions.  IMPRESSION: Right base infiltrate. Lungs elsewhere clear. Stable cardiac enlargement.   Electronically Signed   By: Lowella Grip M.D.   On: 12/18/2013 12:32     EKG Interpretation None  MDM   Final diagnoses:  Chest pain  Cocaine abuse   58 y.o. male presents with substernal chest pain that started at 2 AM overnight. He states that it feels like his prior MI. He has known history of coronary artery disease and is followed by cardiology here. He is also known cocaine abuser. First troponin is negative, EKG without significant ST or T wave changes to suggest myocardial ischemia. Cardiology was consulted given the nature of the patient's pain and continuing ongoing symptoms. They recommended admission for rule out of acute coronary syndrome. The patient was mildly hypoxic on arrival to level of 89% on room air which are covered quickly with 2 L of nasal cannula oxygen. Possible this is an overlying COPD or hypertensive urgency given the patient's vital signs.  Leo Grosser, MD 12/18/13 [id number]

## 2013-12-18 NOTE — ED Notes (Signed)
Pt c/o nausea. MD made aware.

## 2013-12-18 NOTE — ED Notes (Signed)
Per EMS - pt woke up this morning around 2am having some CP that started to radiate to left arm. Pt took 3 nitro, no relief. Hx of stent placed in November. Pt started to get a HA after taking nitro, so he took 2 tylenol, no relief. EMS administered 2 Nitro, no change in pain rating at 8/10. Hx of smoking. 12 lead NSR. Pt placed on [place] at 4 liters/min, increased O2 sat to 95%. initially O2 was 90% on room air. Pt c/o nausea. Pt has a tumor on liver and possible gallbladder issues. BP 150/98. CBG 147. HR 65-70s.

## 2013-12-18 NOTE — H&P (Signed)
Patient ID: [name] MRN: [id number], DOB/AGE: [birth year]   Admit date: 12/18/2013  Primary Physician: Jacqualine Mau, PA-C Primary Cardiologist: Myles Gip, MD   Pt. Profile:  59 year old male with a prior history of coronary artery disease status post loading stent placement to the LAD in August of 2014, who presents secondary to worsening chest pain this morning.  Problem List  Past Medical History  Diagnosis Date  . Hypertension   . TIA (transient ischemic attack) 1997    No residual neurological deficits.  . Hepatitis Late 1970s    Treated in the hospital, finished treatment course. No recurrence  since  . Noncompliance   . Coronary atherosclerosis of native coronary artery     a. 02/2013 Cath/PCI: LM nl, LAD: 50p, 40m (2.5x16 promus DES), LCX nl, OM1 min irregs, LPL/LPDA diff dzs, RCA nondom, mod diff dzs, EF 55%.  [name] COPD (chronic obstructive pulmonary disease)   . Hyperlipidemia   . History of pneumonia     "3-4 times" (03/09/2013)  . Type 2 diabetes mellitus 2011  . GERD (gastroesophageal reflux disease)   . Arthritis     "hands, knees" (03/09/2013)  . Substance abuse   . Abdominal ultrasound, abnormal     a. 11/2013: 9cm RUQ complex cystic lesion in the area of the GB fossa. Diff hepatic steatosis. 1.8cm indeterminate hypoechoic lesion in L hepatic lobe-->Abd MRI w/wo contrast pending.    Past Surgical History  Procedure Laterality Date  . Cardiac catheterization  05/2012  . Coronary angioplasty with stent placement  03/09/2013    "1" (03/09/2013)  . Appendectomy  1970's    Allergies  No Known Allergies  HPI  58 year old male with prior history of coronary artery disease status post PCI and drug-eluting stent placement to the LAD in August of 2014. He also has a history of hypertension, hyperlipidemia, diabetes mellitus, and ongoing tobacco abuse. Patient says that ever since his PCI last year, he has had intermittent exertional chest discomfort and  occasional resting chest discomfort. He lives with his mother whom he helps to take care of as she is status post hip fracture. He does things around the house and says that he will often experience chest tightness and dyspnea with activity. He will often use sublingual nitroglycerin with partial or complete relief. Although, sometimes symptoms may last one or 2 days at a time. He was last seen in clinic on April 28, and reported chest discomfort along with right upper quadrant abdominal discomfort, which has been present for many months. His ECG was nonacute in the office and his isosorbide was increased to 60 mg twice a day and he thinks that this has reduced his amt of chest discomfort some.  He was also referred for an abdominal ultrasound which was abnormal, revealing a 9 cm right upper quadrant complex cystic lesion in the area of the gallbladder fossa along with a 1.8 cm indeterminate hypoechoic lesion in the left hepatic lobe. He has been scheduled for an abdominal MRI with and without contrast for this coming Thursday.  This morning, patient awoke suddenly at approximately 2:30 AM with worsening substernal chest discomfort, dyspnea, nausea, and diaphoresis. Discomfort moved from his chest to the area between his shoulder blades. He took sublingual nitroglycerin without significant relief. Discomfort was worse with palpation and deep breathing. Symptoms persisted throughout the morning and finally he called EMS and was taken to the [facility]. Here, ECG shows no acute changes and initial point-of-care troponin is  normal. He continues to complain of chest discomfort and generalized malaise. Chest pain is worse with palpation. He denies PND, orthopnea, dizziness, syncope edema, or early satiety. He says that he has been compliant with his medications at home.  Home Medications  Prior to Admission medications   Medication Sig Start Date End Date Taking? Authorizing Provider  acetaminophen (TYLENOL) 500 MG  tablet Take 1,000 mg by mouth every 8 (eight) hours as needed for moderate pain.   Yes Historical Provider, MD  aspirin 81 MG chewable tablet Chew 1 tablet (81 mg total) by mouth daily. 11/05/12  Yes Reyne Dumas, MD  clopidogrel (PLAVIX) 75 MG tablet Take 1 tablet (75 mg total) by mouth daily with breakfast. 06/03/12  Yes Ivor Costa, MD  diltiazem (CARDIZEM CD) 240 MG 24 hr capsule Take 1 capsule (240 mg total) by mouth daily. 11/05/12  Yes Reyne Dumas, MD  fish oil-omega-3 fatty acids 1000 MG capsule Take 1 g by mouth 4 (four) times daily.   Yes Historical Provider, MD  gabapentin (NEURONTIN) 100 MG capsule Take 300 mg by mouth at bedtime.    Yes Historical Provider, MD  glipiZIDE (GLUCOTROL) 5 MG tablet Take 2.5 mg by mouth 2 (two) times daily before a meal.   Yes Historical Provider, MD  isosorbide dinitrate (ISORDIL) 30 MG tablet Take 60 mg by mouth 2 (two) times daily.   Yes Historical Provider, MD  lisinopril (PRINIVIL,ZESTRIL) 5 MG tablet Take 5 mg by mouth daily.   Yes Historical Provider, MD  nitroGLYCERIN (NITROSTAT) 0.4 MG SL tablet Place 1 tablet (0.4 mg total) under the tongue every 5 (five) minutes as needed for chest pain. 06/03/12  Yes Ivor Costa, MD  ranitidine (ZANTAC) 300 MG tablet Take 300 mg by mouth at bedtime.   Yes Historical Provider, MD  rosuvastatin (CRESTOR) 20 MG tablet Take 20 mg by mouth at bedtime.   Yes Historical Provider, MD  sitaGLIPtin (JANUVIA) 100 MG tablet Take 100 mg by mouth daily.   Yes Historical Provider, MD   Family History  Family History  Problem Relation Age of Onset  . Hypertension    . Diabetes    . Stroke    . Lung cancer Father     died @ 69.  . Cancer Mother     Thyroid - living in her 31's.  . Cancer Maternal Grandmother     Breast  . Cancer Maternal Grandfather     Throat and stomach  . CAD Father   . CAD Brother     alive @ 76 s/p stenting in his 65's.   Social History  History   Social History  . Marital Status: Single     Spouse Name: N/A    Number of Children: N/A  . Years of Education: N/A   Occupational History  . Not on file.   Social History Main Topics  . Smoking status: Current Every Day Smoker -- 1.00 packs/day for 41 years    Types: Cigarettes  . Smokeless tobacco: Never Used  . Alcohol Use: 0.0 oz/week     Comment: 03/09/2013 "drank a glass of wine last month; last time before that was @ Christmas"  . Drug Use: Yes    Special: Cocaine     Comment: 03/09/2013 "last used cocaine in April"  . Sexual Activity: No   Other Topics Concern  . Not on file   Social History Narrative   Lives in [place] with his mother.  He does not work.  Review of Systems General:  No chills, +++ fever, no night sweats or weight changes.  Cardiovascular:  +++ chest pain, +++ dyspnea on exertion, edema, orthopnea, palpitations, paroxysmal nocturnal dyspnea. Dermatological: No rash, lesions/masses  Respiratory: No cough, +++ dyspnea Urologic: No hematuria, dysuria Abdominal:   +++ nausea, no vomiting, diarrhea, bright red blood per rectum, melena, or hematemesis Neurologic:  No visual changes, wkns, changes in mental status. All other systems reviewed and are otherwise negative except as noted above.  Physical Exam  Blood pressure 166/80, pulse 66, temperature 97.5 F (36.4 C), temperature source Oral, resp. rate 22, height 5\' 9"  (1.753 m), weight 238 lb (107.956 kg), SpO2 90.00%.  General: Pleasant, NAD Psych: Normal affect. Neuro: Alert and oriented X 3. Moves all extremities spontaneously. HEENT: Normal  Neck: Supple without bruits or JVD. Lungs:  Resp regular and unlabored, CTA. Heart: RRR no s3, s4, or murmurs. Abdomen: Soft, diffusely tender to deep palpation - worse in RUQ, non-distended, BS + x 4.  Extremities: No clubbing, cyanosis or edema. DP/PT/Radials 2+ and equal bilaterally.  Labs  Troponin [facility] Test)  Recent Labs  12/18/13 1212  TROPIPOC 0.00   Lab Results  Component  Value Date   WBC 11.3* 12/18/2013   HGB 15.5 12/18/2013   HCT 45.4 12/18/2013   MCV 83.9 12/18/2013   PLT 228 12/18/2013     Recent Labs Lab 12/18/13 1203  NA 137  K 4.2  CL 99  CO2 23  BUN 13  CREATININE 0.98  CALCIUM 9.4  GLUCOSE 190*   Lab Results  Component Value Date   CHOL 115 11/04/2012   HDL 23* 11/04/2012   LDLCALC 45 11/04/2012   TRIG 234* 11/04/2012   Radiology/Studies  Dg Chest Port 1 View  12/18/2013   CLINICAL DATA:  Chest pain  EXAM: PORTABLE CHEST - 1 VIEW   IMPRESSION: Right base infiltrate. Lungs elsewhere clear. Stable cardiac enlargement.   Electronically Signed   By: Lowella Grip M.D.   On: 12/18/2013 12:32   US Abdomen Limited Ruq  11/22/2013   CLINICAL DATA:  Right upper quadrant pain.  EXAM: US ABDOMEN LIMITED - RIGHT UPPER QUADRANT  IMPRESSION: 9 cm right upper quadrant complex cystic lesion in the area of the gallbladder fossa. It is uncertain within this represents an abnormal gallbladder due to cholecystitis, versus a complex cystic mass. No sonographic Percell Miller sign reported by sonographer. Recommend clinical correlation, and consider abdomen CT or MRI for further evaluation.  Diffuse hepatic steatosis. 1.8 cm indeterminate hypoechoic lesion in the left hepatic lobe, with differential diagnosis including neoplasm and focal fatty sparing. Recommend further imaging characterization with abdomen MRI without and with contrast as the preferred exam. (Abd CT without and with contrast would be appropriate if patient has contraindication to MRI or cannot cooperate with breath-holding.)   Electronically Signed   By: Earle Gell M.D.   On: 11/22/2013 09:17   ECG  RSR, 64, no acute st/t changes.  ASSESSMENT AND PLAN  1.  Midsternal chest pain/CAD:  Pt presents with a long h/o intermittent rest and exertional chest pain that is intermittently nitrate responsive.  He did get some additional relief when his nitrate dose was doubled in April but had a particularly bad  episode of c/p this morning that is ongoing.  Despite prolonged Ss, his ecg is non-acute and first troponin is nl.  Will continue to cycle CE.  Add heparin for the time being.  Cont asa, plavix, statin, and  nitrate.  He is also on dilt @ home, presumably chosen over bb 2/2 h/o cocaine abuse, which he says he hasn't used since 10/2012.  As he has a long h/o c/p Ss, we will plan on a diagnostic catheterization during this admission.  Will arrange for MRI of his abd first, in case he has pathology that will require surgical intervention, which could then change our approach if he were to have recurrent severe CAD.  2.  Abd Pain/Abnl Abdominal U/S:  He has had abd discomfort for several months now with u/s in May showing two concerning areas.  He is scheduled for outpt MRI for this Thursday and we will have this performed today.  Renal fxn wnl.  3.  HTN:  BP currently elevated. Resume home meds and follow.  4.  HL:  Check lipids/lft's.  Cont statin.  5.  DM:  Cont januvia. Add SSI.   Signed, Rogelia Mire, NP 12/18/2013, 2:27 PM

## 2013-12-18 NOTE — ED Notes (Signed)
Report called to Levada Dy, RN 2C.

## 2013-12-18 NOTE — ED Notes (Signed)
Pt reporting nausea.  Per wife at bedside, pt has thrown up once.  4 mg of Zofran given.

## 2013-12-18 NOTE — H&P (Signed)
Patient seen, examined. Available data reviewed. Agree with findings, assessment, and plan as outlined by [name], NP. The patient was independently interviewed and examined. His pain pattern is well described above. On exam, he is alert, oriented, and in no distress. Lung fields are clear. Heart is regular rate and rhythm without murmurs or gallops. Abdomen is obese with mild diffuse tenderness but no rebound or guarding. Extremities show no edema. I have reviewed his EKG, laboratory, and radiographic data. At this point it is somewhat unclear if his symptoms are cardiac related. He has been recently diagnosed with a complex cystic lesion in the gallbladder fossa as well as a hypoechoic lesion in the left hepatic lobe. We will obtain an abdominal MRI prior to any invasive cardiac procedures to better understand his intra-abdominal process. Pending the MRI results and further cardiac enzymes, may need to consider repeat cardiac catheterization tomorrow. The patient's chest pain has both typical and atypical features, but there is a component of nitrate responsiveness. Will keep n.p.o. after midnight for possible cardiac catheterization tomorrow.  [name], M.D. 12/18/2013 4:18 PM

## 2013-12-18 NOTE — ED Notes (Signed)
Portable xray at bedside.

## 2013-12-19 ENCOUNTER — Encounter (HOSPITAL_COMMUNITY)
Admission: EM | Disposition: A | Discharge status: Home / Self Care | Payer: Self-pay | Source: Home / Self Care | Attending: Cardiovascular Disease

## 2013-12-19 DIAGNOSIS — F1910 Other psychoactive substance abuse, uncomplicated: Secondary | ICD-10-CM

## 2013-12-19 DIAGNOSIS — R109 Unspecified abdominal pain: Secondary | ICD-10-CM

## 2013-12-19 LAB — CBC
HCT: 43.9 % (ref 39.0–52.0)
HEMOGLOBIN: 15.3 g/dL (ref 13.0–17.0)
MCH: 28.8 pg (ref 26.0–34.0)
MCHC: 34.9 g/dL (ref 30.0–36.0)
MCV: 82.5 fL (ref 78.0–100.0)
Platelets: 243 10*3/uL (ref 150–400)
RBC: 5.32 MIL/uL (ref 4.22–5.81)
RDW: 14 % (ref 11.5–15.5)
WBC: 14.8 10*3/uL — AB (ref 4.0–10.5)

## 2013-12-19 LAB — CBC WITH DIFFERENTIAL/PLATELET
Basophils Absolute: 0 10*3/uL (ref 0.0–0.1)
Basophils Relative: 0 % (ref 0–1)
Eosinophils Absolute: 0.1 10*3/uL (ref 0.0–0.7)
Eosinophils Relative: 0 % (ref 0–5)
HCT: 44.9 % (ref 39.0–52.0)
HEMOGLOBIN: 15.6 g/dL (ref 13.0–17.0)
LYMPHS PCT: 13 % (ref 12–46)
Lymphs Abs: 1.8 10*3/uL (ref 0.7–4.0)
MCH: 28.9 pg (ref 26.0–34.0)
MCHC: 34.7 g/dL (ref 30.0–36.0)
MCV: 83.3 fL (ref 78.0–100.0)
MONOS PCT: 7 % (ref 3–12)
Monocytes Absolute: 1 10*3/uL (ref 0.1–1.0)
NEUTROS ABS: 11.4 10*3/uL — AB (ref 1.7–7.7)
Neutrophils Relative %: 80 % — ABNORMAL HIGH (ref 43–77)
Platelets: 235 10*3/uL (ref 150–400)
RBC: 5.39 MIL/uL (ref 4.22–5.81)
RDW: 14.2 % (ref 11.5–15.5)
WBC: 14.3 10*3/uL — AB (ref 4.0–10.5)

## 2013-12-19 LAB — TROPONIN I: Troponin I: <0.3 ng/mL (ref <0.30)

## 2013-12-19 LAB — LIPID PANEL
CHOL/HDL RATIO: 4.1 ratio
CHOLESTEROL: 128 mg/dL (ref 0–200)
HDL: 31 mg/dL — AB (ref >39)
LDL Cholesterol: 67 mg/dL (ref 0–99)
Triglycerides: 152 mg/dL — ABNORMAL HIGH (ref <150)
VLDL: 30 mg/dL (ref 0–40)

## 2013-12-19 LAB — COMPREHENSIVE METABOLIC PANEL
ALBUMIN: 3.7 g/dL (ref 3.5–5.2)
ALT: 17 U/L (ref 0–53)
AST: 15 U/L (ref 0–37)
Alkaline Phosphatase: 92 U/L (ref 39–117)
BILIRUBIN TOTAL: 0.5 mg/dL (ref 0.3–1.2)
BUN: 9 mg/dL (ref 6–23)
CHLORIDE: 96 meq/L (ref 96–112)
CO2: 24 mEq/L (ref 19–32)
CREATININE: 0.83 mg/dL (ref 0.50–1.35)
Calcium: 9.5 mg/dL (ref 8.4–10.5)
GFR calc Af Amer: >90 mL/min (ref >90)
GFR calc non Af Amer: >90 mL/min (ref >90)
Glucose, Bld: 177 mg/dL — ABNORMAL HIGH (ref 70–99)
Potassium: 4.3 mEq/L (ref 3.7–5.3)
SODIUM: 133 meq/L — AB (ref 137–147)
Total Protein: 7.9 g/dL (ref 6.0–8.3)

## 2013-12-19 LAB — HEPARIN LEVEL (UNFRACTIONATED): Heparin Unfractionated: 0.38 IU/mL (ref 0.30–0.70)

## 2013-12-19 LAB — GLUCOSE, CAPILLARY
GLUCOSE-CAPILLARY: 161 mg/dL — AB (ref 70–99)
GLUCOSE-CAPILLARY: 154 mg/dL — AB (ref 70–99)
GLUCOSE-CAPILLARY: 172 mg/dL — AB (ref 70–99)
Glucose-Capillary: 141 mg/dL — ABNORMAL HIGH (ref 70–99)

## 2013-12-19 LAB — LIPASE, BLOOD: LIPASE: 40 U/L (ref 11–59)

## 2013-12-19 LAB — AMYLASE: Amylase: 34 U/L (ref 0–105)

## 2013-12-19 SURGERY — LEFT HEART CATHETERIZATION WITH CORONARY ANGIOGRAM
Anesthesia: LOCAL

## 2013-12-19 NOTE — Progress Notes (Signed)
Pt is still waiting to get an MRI. Called and talked to MRI a couple of times and they said they received a lot of stat emergencies from ED which kept bumping pt later. Pt is still npo and family at bedside.

## 2013-12-19 NOTE — ED Provider Notes (Signed)
I saw and evaluated the patient, reviewed the resident's note and I agree with the findings and plan.   EKG Interpretation None      Patient with history of CAD who presents with chest pain. Also known cocaine abuser. Last cath was back in August which showed an in-stent stenosis. He is nontoxic on exam. Troponin and EKG reassuring at this time. Noted to be mildly hypoxic on arrival the patient is a current smoker with a history of COPD. Cardiology to admit patient for further evaluation and serial enzymes.  Merryl Hacker, MD 12/19/13 1655

## 2013-12-19 NOTE — Progress Notes (Signed)
58 year old male with a prior history of coronary artery disease status post loading stent placement to the LAD in August of 2014, who presented yesterday secondary to worsening chest pain.  At this point it is somewhat unclear if his symptoms are cardiac related. He has been recently diagnosed with a complex cystic lesion in the gallbladder fossa as well as a hypoechoic lesion in the left hepatic lobe. We will obtain an abdominal MRI prior to any invasive cardiac procedures to better understand his intra-abdominal process. Pending the MRI results and further cardiac enzymes, may need to consider repeat cardiac catheterization tomorrow. The patient's chest pain has both typical and atypical features, but there is a component of nitrate responsiveness. Will keep n.p.o. after midnight for possible cardiac catheterization.  MRI still pending, to be done today, radiology backed up.  Subjective:   Does not feel well, still with RUQ pain, more of a tenderness today unless palpated.  No further chest pain.    Objective: Vital signs in last 24 hours: Temp:  [97.5 F (36.4 C)-98.5 F (36.9 C)] 97.8 F (36.6 C) (06/02 0819) Pulse Rate:  [66-90] 76 (06/02 0819) Resp:  [13-27] 16 (06/02 0819) BP: (113-170)/(60-90) 127/66 mmHg (06/02 0909) SpO2:  [85 %-94 %] 92 % (06/02 0819) Weight:  [238 lb (107.956 kg)-239 lb 3.2 oz (108.5 kg)] 239 lb 3.2 oz (108.5 kg) (06/02 0403) Weight change:  Last BM Date: 12/18/13 Intake/Output from previous day: -918 06/01 0701 - 06/02 0700 In: 481.4 [P.O.:480; I.V.:1.4] Out: 1400 [Urine:1400] Intake/Output this shift: Total I/O In: 6 [I.V.:6] Out: -   PE: General:Pleasant affect, NAD Skin:Warm and dry, brisk capillary refill HEENT:normocephalic, sclera clear, mucus membranes moist Neck:supple, no JVD flat in bed Heart:S1S2 RRR without murmur, gallup, rub or click Lungs:clear without rales, rhonchi, or wheezes WJX:BJYN, + tender, RUQ pain to palpation, + BS,  do not palpate liver spleen or masses Ext:no lower ext edema, 2+ pedal pulses, 2+ radial pulses Neuro:alert and oriented, MAE, follows commands, + facial symmetry   Tele:  SR Lab Results:  Recent Labs  12/18/13 1203 12/19/13 0220  WBC 11.3* 14.8*  HGB 15.5 15.3  HCT 45.4 43.9  PLT 228 243   BMET  Recent Labs  12/18/13 1203 12/19/13 0220  NA 137 133*  K 4.2 4.3  CL 99 96  CO2 23 24  GLUCOSE 190* 177*  BUN 13 9  CREATININE 0.98 0.83  CALCIUM 9.4 9.5    Recent Labs  12/19/13 0220 12/19/13 0722  TROPONINI <0.30 <0.30    Lab Results  Component Value Date   CHOL 128 12/19/2013   HDL 31* 12/19/2013   LDLCALC 67 12/19/2013   TRIG 152* 12/19/2013   CHOLHDL 4.1 12/19/2013   Lab Results  Component Value Date   HGBA1C 7.4* 03/09/2013     Lab Results  Component Value Date   TSH 0.331* 12/18/2013    Hepatic Function Panel  Recent Labs  12/19/13 0220  PROT 7.9  ALBUMIN 3.7  AST 15  ALT 17  ALKPHOS 92  BILITOT 0.5    Recent Labs  12/19/13 0220  CHOL 128   No results found for this basename: PROTIME,  in the last 72 hours     Studies/Results: Dg Chest Port 1 View  12/18/2013   CLINICAL DATA:  Chest pain  EXAM: PORTABLE CHEST - 1 VIEW  COMPARISON:  March 08, 2013  FINDINGS: There is a small area of infiltrate in the right base.  Elsewhere lungs are clear. Heart is mildly enlarged with normal pulmonary vascularity. No adenopathy. No bone lesions.  IMPRESSION: Right base infiltrate. Lungs elsewhere clear. Stable cardiac enlargement.   Electronically Signed   By: Lowella Grip M.D.   On: 12/18/2013 12:32    Medications: I have reviewed the patient's current medications. Scheduled Meds: . antiseptic oral rinse  15 mL Mouth Rinse BID  . aspirin  81 mg Oral Daily  . aspirin  81 mg Oral Pre-Cath  . atorvastatin  40 mg Oral q1800  . clopidogrel  75 mg Oral Q breakfast  . diltiazem  240 mg Oral Daily  . gabapentin  300 mg Oral QHS  . glipiZIDE  2.5 mg Oral BID  AC  . insulin aspart  0-15 Units Subcutaneous TID WC  . linagliptin  5 mg Oral Daily  . lisinopril  10 mg Oral Daily  . omega-3 acid ethyl esters  1 g Oral q12n4p  . pantoprazole  40 mg Oral Q0600  . sodium chloride  3 mL Intravenous Q12H  . sodium chloride  3 mL Intravenous Q12H   Continuous Infusions: . sodium chloride 1 mL/kg/hr (12/19/13 0412)  . heparin 1,900 Units/hr (12/19/13 0208)  . nitroGLYCERIN 3 mcg/min (12/18/13 2200)   PRN Meds:.sodium chloride, sodium chloride, nitroGLYCERIN, sodium chloride, sodium chloride  Assessment/Plan: 1. Midsternal chest pain/CAD: Pt presents with a long h/o intermittent rest and exertional chest pain that is intermittently nitrate responsive. He did get some additional relief when his nitrate dose was doubled in April but had a particularly bad episode of c/p yesterday morning that was ongoing on admit. Despite prolonged Ss, his ecg is non-acute and first troponin is nl. Will continue to cycle CE. Add heparin for the time being. Cont asa, plavix, statin, and nitrate. He is also on dilt @ home, presumably chosen over bb 2/2 h/o cocaine abuse, which he says he hasn't used since 10/2012. As he has a long h/o c/p Ss, we will plan on a diagnostic catheterization during this admission. Will arrange for MRI of his abd first, in case he has pathology that will require surgical intervention, which could then change our approach if he were to have recurrent severe CAD. Negative MI, no longer with chest pain.  NTG at 10 mcg, IV heparin continues.  Continue to hold off on cath.  Will call GI for consult once MRI complete.  2. Abd Pain/Abnl Abdominal U/S: He has had abd discomfort for several months now with u/s in May showing two concerning areas. He is scheduled for outpt MRI for this Thursday and we will have this performed today- not done yesterday to be done today. Renal fxn wnl.  WBC is climbing but no fever. Will get MRI, to be done. Will check lipase,  amylase.  3. HTN: BP currently elevated. Has been up and down.  Resume home meds and follow.   4. HL: Check lipids/lft's. Cont statin. T chol 128, TG 152, HDL 31, LDL 67 this admit  5. DM: Cont [place]. Add SSI.accu check stable 150-160  6. Leukocytosis WBC elevated.   LOS: 1 day   Time spent with pt. :15 minutes. Cecilie Kicks  Nurse Practitioner Certified Pager [phone] or after 5pm and on weekends call [phone] 12/19/2013, 9:32 AM   Personally seen and examined. Agree with above. MRI abdomen GI consult pending MRI Holding on cath until further clarified.  NTG gtt helped with discomfort in chest he states.   Candee Furbish, MD

## 2013-12-19 NOTE — Progress Notes (Signed)
ANTICOAGULATION CONSULT NOTE - Follow Up Consult  Pharmacy Consult for Heparin Indication: chest pain/ACS  No Known Allergies  Patient Measurements: Height: 5\' 9"  (175.3 cm) Weight: 239 lb 3.2 oz (108.5 kg) IBW/kg (Calculated) : 70.7 Heparin Dosing Weight: 94 kg  Vital Signs: Temp: 97.8 F (36.6 C) (06/02 0819) Temp src: Oral (06/02 0819) BP: 127/66 mmHg (06/02 0909) Pulse Rate: 76 (06/02 0819)  Labs:  Recent Labs  12/18/13 1203 12/18/13 1950 12/18/13 2246 12/19/13 0220 12/19/13 0722 12/19/13 1125  HGB 15.5  --   --  15.3  --  15.6  HCT 45.4  --   --  43.9  --  44.9  PLT 228  --   --  243  --  235  HEPARINUNFRC  --   --  0.43 0.38  --   --   CREATININE 0.98  --   --  0.83  --   --   TROPONINI  --  <0.30  --  <0.30 <0.30  --     Estimated Creatinine Clearance: 117.7 ml/min (by C-G formula based on Cr of 0.83).  Assessment:   Heparin level remains therapeutic (0.38) on 1900 units/hr.  CBC stable.  Enzymes negative.  No further chest pain noted, but may need cardiac cath.  Abdominal MRI pending.  Goal of Therapy:  Heparin level 0.3-0.7 units/ml Monitor platelets by anticoagulation protocol: Yes   Plan:   Continue heparin drip at 1900 units/hr.  Next heparin level and CBC in am.  Arty Baumgartner, [place] Pager: [phone] 12/19/2013,1:31 PM

## 2013-12-20 ENCOUNTER — Encounter (HOSPITAL_COMMUNITY)
Admission: EM | Disposition: A | Discharge status: Home / Self Care | Payer: Self-pay | Source: Home / Self Care | Attending: Cardiovascular Disease

## 2013-12-20 ENCOUNTER — Encounter (HOSPITAL_COMMUNITY)
Admission: EM | Disposition: A | Discharge status: Home / Self Care | Payer: Medicaid Other | Source: Home / Self Care | Attending: Cardiovascular Disease

## 2013-12-20 ENCOUNTER — Encounter (HOSPITAL_COMMUNITY): Payer: Self-pay | Admitting: General Surgery

## 2013-12-20 ENCOUNTER — Inpatient Hospital Stay (HOSPITAL_COMMUNITY): Payer: Medicaid Other

## 2013-12-20 DIAGNOSIS — E1165 Type 2 diabetes mellitus with hyperglycemia: Secondary | ICD-10-CM

## 2013-12-20 DIAGNOSIS — Z9119 Patient's noncompliance with other medical treatment and regimen: Secondary | ICD-10-CM

## 2013-12-20 DIAGNOSIS — E118 Type 2 diabetes mellitus with unspecified complications: Secondary | ICD-10-CM

## 2013-12-20 DIAGNOSIS — R110 Nausea: Secondary | ICD-10-CM

## 2013-12-20 DIAGNOSIS — Z91199 Patient's noncompliance with other medical treatment and regimen due to unspecified reason: Secondary | ICD-10-CM

## 2013-12-20 DIAGNOSIS — I2510 Atherosclerotic heart disease of native coronary artery without angina pectoris: Secondary | ICD-10-CM

## 2013-12-20 DIAGNOSIS — N289 Disorder of kidney and ureter, unspecified: Secondary | ICD-10-CM

## 2013-12-20 DIAGNOSIS — R079 Chest pain, unspecified: Secondary | ICD-10-CM

## 2013-12-20 DIAGNOSIS — R1011 Right upper quadrant pain: Secondary | ICD-10-CM

## 2013-12-20 DIAGNOSIS — F1410 Cocaine abuse, uncomplicated: Secondary | ICD-10-CM

## 2013-12-20 DIAGNOSIS — K810 Acute cholecystitis: Secondary | ICD-10-CM | POA: Diagnosis present

## 2013-12-20 DIAGNOSIS — I10 Essential (primary) hypertension: Secondary | ICD-10-CM

## 2013-12-20 DIAGNOSIS — IMO0002 Reserved for concepts with insufficient information to code with codable children: Secondary | ICD-10-CM

## 2013-12-20 HISTORY — DX: Acute cholecystitis: K81.0

## 2013-12-20 HISTORY — PX: LEFT HEART CATHETERIZATION WITH CORONARY ANGIOGRAM: SHX5451

## 2013-12-20 LAB — BASIC METABOLIC PANEL
BUN: 14 mg/dL (ref 6–23)
CHLORIDE: 99 meq/L (ref 96–112)
CO2: 25 mEq/L (ref 19–32)
Calcium: 9.2 mg/dL (ref 8.4–10.5)
Creatinine, Ser: 1.15 mg/dL (ref 0.50–1.35)
GFR calc non Af Amer: 68 mL/min — ABNORMAL LOW (ref >90)
GFR, EST AFRICAN AMERICAN: 79 mL/min — AB (ref >90)
Glucose, Bld: 229 mg/dL — ABNORMAL HIGH (ref 70–99)
POTASSIUM: 4 meq/L (ref 3.7–5.3)
Sodium: 137 mEq/L (ref 137–147)

## 2013-12-20 LAB — CBC
HEMATOCRIT: 41.2 % (ref 39.0–52.0)
Hemoglobin: 14.2 g/dL (ref 13.0–17.0)
MCH: 29 pg (ref 26.0–34.0)
MCHC: 34.5 g/dL (ref 30.0–36.0)
MCV: 84.1 fL (ref 78.0–100.0)
PLATELETS: 221 10*3/uL (ref 150–400)
RBC: 4.9 MIL/uL (ref 4.22–5.81)
RDW: 14.3 % (ref 11.5–15.5)
WBC: 10.1 10*3/uL (ref 4.0–10.5)

## 2013-12-20 LAB — GLUCOSE, CAPILLARY
GLUCOSE-CAPILLARY: 158 mg/dL — AB (ref 70–99)
Glucose-Capillary: 133 mg/dL — ABNORMAL HIGH (ref 70–99)
Glucose-Capillary: 136 mg/dL — ABNORMAL HIGH (ref 70–99)
Glucose-Capillary: 145 mg/dL — ABNORMAL HIGH (ref 70–99)
Glucose-Capillary: 138 mg/dL — ABNORMAL HIGH (ref 70–99)

## 2013-12-20 LAB — HEPARIN LEVEL (UNFRACTIONATED): HEPARIN UNFRACTIONATED: 0.42 [IU]/mL (ref 0.30–0.70)

## 2013-12-20 LAB — PROTIME-INR
INR: 1.05 (ref 0.00–1.49)
Prothrombin Time: 13.5 seconds (ref 11.6–15.2)

## 2013-12-20 SURGERY — LEFT HEART CATHETERIZATION WITH CORONARY ANGIOGRAM
Anesthesia: LOCAL

## 2013-12-20 MED ORDER — FENTANYL CITRATE 0.05 MG/ML IJ SOLN
INTRAMUSCULAR | Status: AC
  Filled 2013-12-20: qty 2

## 2013-12-20 MED ORDER — NITROGLYCERIN 0.2 MG/ML ON CALL CATH LAB
INTRAVENOUS | Status: AC
  Filled 2013-12-20: qty 1

## 2013-12-20 MED ORDER — VERAPAMIL HCL 2.5 MG/ML IV SOLN
INTRAVENOUS | Status: AC
  Filled 2013-12-20: qty 2

## 2013-12-20 MED ORDER — HEPARIN (PORCINE) IN NACL 2-0.9 UNIT/ML-% IJ SOLN
INTRAMUSCULAR | Status: AC
  Filled 2013-12-20: qty 1500

## 2013-12-20 MED ORDER — MIDAZOLAM HCL 2 MG/2ML IJ SOLN
INTRAMUSCULAR | Status: AC
  Filled 2013-12-20: qty 2

## 2013-12-20 MED ORDER — HEPARIN SODIUM (PORCINE) 1000 UNIT/ML IJ SOLN
INTRAMUSCULAR | Status: AC
  Filled 2013-12-20: qty 1

## 2013-12-20 MED ORDER — LIDOCAINE HCL (PF) 1 % IJ SOLN
INTRAMUSCULAR | Status: AC
  Filled 2013-12-20: qty 30

## 2013-12-20 MED ORDER — ISOSORBIDE MONONITRATE ER 30 MG PO TB24
30.0000 mg | ORAL_TABLET | Freq: Every day | ORAL | Status: DC
  Administered 2013-12-20 – 2013-12-23 (×4): 30 mg via ORAL
  Filled 2013-12-20 (×4): qty 1

## 2013-12-20 MED ORDER — HEPARIN SODIUM (PORCINE) 5000 UNIT/ML IJ SOLN
5000.0000 [IU] | Freq: Three times a day (TID) | INTRAMUSCULAR | Status: DC
  Filled 2013-12-20 (×3): qty 1

## 2013-12-20 MED ORDER — SODIUM CHLORIDE 0.9 % IV SOLN
INTRAVENOUS | Status: AC
  Administered 2013-12-20: 16:00:00 via INTRAVENOUS

## 2013-12-20 MED ORDER — GADOBENATE DIMEGLUMINE 529 MG/ML IV SOLN
20.0000 mL | Freq: Once | INTRAVENOUS | Status: AC | PRN
  Administered 2013-12-20: 20 mL via INTRAVENOUS

## 2013-12-20 NOTE — Progress Notes (Signed)
Nurse and pt just got back to floor from MRI. Central telemetry called and notified.

## 2013-12-20 NOTE — Progress Notes (Signed)
Cath lab called and stated they were on way to get pt [name].

## 2013-12-20 NOTE — H&P (View-Only) (Signed)
Surgery consultation appreciated.  Obtained history of exertional chest pain with minimal effort (walking across neighbors yard) and trouble laying flat. (no complaints earlier of this).   Because of upcoming procedure and discovery of these symptoms, we will proceed with diagnostic left heart cath. Prior DES to LAD 8/14. Discussed his case with Dr. McAlhaney. If intervention is needed, will perform. NUC stress would be challenging to interpret with body habitus.   Reviewed with patient. Discussed risks and benefits including, CVA, death, bleeding. He is willing to proceed. Questions answered.   Kevin Sayed, MD    

## 2013-12-20 NOTE — Progress Notes (Addendum)
58 year old male with +cocaine use 12/18/13,  prior history of coronary artery disease status post stent placement to the LAD in August of 2014, who presented with worsening chest pain. At this point it is somewhat unclear if his symptoms are cardiac related. He has been recently diagnosed with a complex cystic lesion in the gallbladder fossa as well as a hypoechoic lesion in the left hepatic lobe. MRI prior to any invasive cardiac procedures to better understand his intra-abdominal process. The patient's chest pain has both typical and atypical features, but there is a component of nitrate responsiveness. Will keep n.p.o.  cardiac catheterization for today- scheduled for 1pm. MRI result pending, just completed.    Subjective: No further chest pain on IV NTG.  + abd pain to palpation  Objective: Vital signs in last 24 hours: Temp:  [98.4 F (36.9 C)-98.8 F (37.1 C)] 98.8 F (37.1 C) (06/03 0359) Pulse Rate:  [58-127] 58 (06/03 0359) Resp:  [0-24] 15 (06/03 0359) BP: (90-140)/(41-77) 115/66 mmHg (06/03 0359) SpO2:  [87 %-100 %] 100 % (06/03 0359) Weight:  [238 lb 1.6 oz (108 kg)] 238 lb 1.6 oz (108 kg) (06/03 0359) Weight change: 1.6 oz (0.044 kg) Last BM Date: 12/18/13 Intake/Output from previous day: +67.9 06/02 0701 - 06/03 0700 In: 1957.9 [P.O.:480; I.V.:1477.9] Out: 2020 [Urine:2020] Intake/Output this shift:    PE: General:Pleasant affect, NAD Skin:Warm and dry, brisk capillary refill HEENT:normocephalic, sclera clear, mucus membranes moist Heart:S1S2 RRR without murmur, gallup, rub or click Lungs:clear ant,  without rales, rhonchi, or wheezes WVP:XTGG, + tenderness RUQ, + BS, do not palpate liver spleen or masses Ext:no lower ext edema, 2+ pedal pulses, 2+ radial pulses Neuro:alert and oriented, MAE, follows commands, + facial symmetry   Lab Results:  Recent Labs  12/19/13 1125 12/20/13 0213  WBC 14.3* 10.1  HGB 15.6 14.2  HCT 44.9 41.2  PLT 235 221    BMET  Recent Labs  12/19/13 0220 12/20/13 0213  NA 133* 137  K 4.3 4.0  CL 96 99  CO2 24 25  GLUCOSE 177* 229*  BUN 9 14  CREATININE 0.83 1.15  CALCIUM 9.5 9.2    Recent Labs  12/19/13 0220 12/19/13 0722  TROPONINI <0.30 <0.30    Lab Results  Component Value Date   CHOL 128 12/19/2013   HDL 31* 12/19/2013   LDLCALC 67 12/19/2013   TRIG 152* 12/19/2013   CHOLHDL 4.1 12/19/2013   Lab Results  Component Value Date   HGBA1C 7.4* 03/09/2013     Lab Results  Component Value Date   TSH 0.331* 12/18/2013    Hepatic Function Panel  Recent Labs  12/19/13 0220  PROT 7.9  ALBUMIN 3.7  AST 15  ALT 17  ALKPHOS 92  BILITOT 0.5    Recent Labs  12/19/13 0220  CHOL 128        Studies/Results: Dg Chest Port 1 View  12/18/2013   CLINICAL DATA:  Chest pain  EXAM: PORTABLE CHEST - 1 VIEW  COMPARISON:  March 08, 2013  FINDINGS: There is a small area of infiltrate in the right base. Elsewhere lungs are clear. Heart is mildly enlarged with normal pulmonary vascularity. No adenopathy. No bone lesions.  IMPRESSION: Right base infiltrate. Lungs elsewhere clear. Stable cardiac enlargement.   Electronically Signed   By: Lowella Grip M.D.   On: 12/18/2013 12:32    Medications: I have reviewed the patient's current medications. Scheduled Meds: . antiseptic oral rinse  15 mL Mouth Rinse BID  . aspirin  81 mg Oral Daily  . atorvastatin  40 mg Oral q1800  . clopidogrel  75 mg Oral Q breakfast  . diltiazem  240 mg Oral Daily  . gabapentin  300 mg Oral QHS  . glipiZIDE  2.5 mg Oral BID AC  . insulin aspart  0-15 Units Subcutaneous TID WC  . linagliptin  5 mg Oral Daily  . lisinopril  10 mg Oral Daily  . omega-3 acid ethyl esters  1 g Oral q12n4p  . pantoprazole  40 mg Oral Q0600  . sodium chloride  3 mL Intravenous Q12H  . sodium chloride  3 mL Intravenous Q12H   Continuous Infusions: . sodium chloride 1 mL/kg/hr (12/19/13 1327)  . heparin 1,900 Units/hr (12/20/13 0714)   . nitroGLYCERIN 10 mcg/min (12/19/13 2000)   PRN Meds:.sodium chloride, sodium chloride, nitroGLYCERIN, sodium chloride, sodium chloride  Assessment/Plan: 1. Midsternal chest pain/CAD: Pt presents with a long h/o intermittent rest and exertional chest pain that is intermittently nitrate responsive. He did get some additional relief when his nitrate dose was doubled in April but had a particularly bad episode of c/p that was ongoing on admit. Despite prolonged Ss, his ecg is non-acute and troponins nl. Neg MI,  on heparin for the time being. Cont asa, plavix, statin, and nitrate. He is also on dilt @ home, presumably chosen over bb 2/2 h/o cocaine abuse, which he says he hasn't used since 10/2012. + drug screen  As he has a long h/o c/p Ss, we will plan on a diagnostic catheterization.  MRI done-await results. In case he has pathology that will require surgical intervention, which could then change our approach if he were to have recurrent severe CAD. No longer with chest pain. NTG at 10 mcg, IV heparin continues.  Will call GI for consult once MRI complete- awaiting results.   2. Abd Pain/Abnl Abdominal U/S: He has had abd discomfort for several months now with u/s in May showing two concerning areas. He was scheduled for outpt MRI for this Thursday but had this performed today. Renal fxn wnl. WBC was climbing- now down to 10, but no fever. Checked lipase, amylase both negative. Will have GI consulted for RUQ abd pain.   3. HTN: BP was elevated now controlled on home meds    4. HL: Checked lipids/lft's. Cont statin. T chol 128, TG 152, HDL 31, LDL 67 this admit   5. DM: Cont [place]. Add SSI.accu check stable 150-170   6. Leukocytosis WBC now down to 10.   7. Cocaine use - cessation.      LOS: 2 days   Time spent with pt. :15 minutes. Cecilie Kicks  Nurse Practitioner Certified Pager [phone] or after 5pm and on weekends call [phone] 12/20/2013, 9:42 AM   Personally seen and examined.  Agree with above. Made changes to note above.  Cath today.  Pending MRI results GI consult RUQ pain Candee Furbish, MD  ADDENDUM: 12/20/13 at 11am  -MRI results concerning for renal cell carcinoma and cholecystitis.  -Since no further chest pain, troponin neg, will cancel catheterization to further work up MRI findings.  -Will stop heparin and NTG drip.  -Start Imdur 30.  -Will stop Plavix for potential upcoming surgery (has almost been one year since DES placement 8/14 and risk of stent thrombosis is low at this point. Bleeding from potential surgery outweighs the risk).  -Consulting URO, GI, GEN surg.  -Discussing with patient.  Candee Furbish, MD

## 2013-12-20 NOTE — Progress Notes (Signed)
Reassuring cath Widely patent DES to LAD Moderate diffuse distal disease.  He may proceed with surgery, plan and Friday. We will continue to hold clopidogrel.  Candee Furbish, MD

## 2013-12-20 NOTE — Consult Note (Signed)
Urology Consult  Referring physician: Dorene Ar Reason for referral: Renal mass  Chief Complaint: Renal mass  History of Present Illness: Recent MRI demonstrates renal mass; GB disease and assessed by cardiology and needs cardiac cath; chest pain; smokes; MRI: protein cyst and complex cyst/mass in lower pole of right kidney as large as 8.8 cm - solid and cystic component; asymptomatic Modifying factors: There are no other modifying factors  Associated signs and symptoms: There are no other associated signs and symptoms Aggravating and relieving factors: There are no other aggravating or relieving factors Severity: Moderate Duration: Persistent  Past Medical History  Diagnosis Date  . Hypertension   . TIA (transient ischemic attack) 1997    No residual neurological deficits.  . Hepatitis Late 1970s    Treated in the hospital, finished treatment course. No recurrence  since  . Noncompliance   . Coronary atherosclerosis of native coronary artery     a. 02/2013 Cath/PCI: LM nl, LAD: 50p, 25m (2.5x16 promus DES), LCX nl, OM1 min irregs, LPL/LPDA diff dzs, RCA nondom, mod diff dzs, EF 55%.  [name] COPD (chronic obstructive pulmonary disease)   . Hyperlipidemia   . History of pneumonia     "3-4 times" (03/09/2013)  . Type 2 diabetes mellitus 2011  . GERD (gastroesophageal reflux disease)   . Arthritis     "hands, knees" (03/09/2013)  . Substance abuse   . Abdominal ultrasound, abnormal     a. 11/2013: 9cm RUQ complex cystic lesion in the area of the GB fossa. Diff hepatic steatosis. 1.8cm indeterminate hypoechoic lesion in L hepatic lobe-->Abd MRI w/wo contrast pending.   Past Surgical History  Procedure Laterality Date  . Cardiac catheterization  05/2012  . Coronary angioplasty with stent placement  03/09/2013    "1" (03/09/2013)  . Appendectomy  1970's    Medications: I have reviewed the patient's current medications. Allergies: No Known Allergies  Family History  Problem Relation Age of  Onset  . Hypertension    . Diabetes    . Stroke    . Lung cancer Father     died @ 72.  . Cancer Mother     Thyroid - living in her 31's.  . Cancer Maternal Grandmother     Breast  . Cancer Maternal Grandfather     Throat and stomach  . CAD Father   . CAD Brother     alive @ 42 s/p stenting in his 64's.   Social History:  reports that he has been smoking Cigarettes.  He has a 41 pack-year smoking history. He has never used smokeless tobacco. He reports that he does not drink alcohol or use illicit drugs.  ROS: All systems are reviewed and negative except as noted. Rest negative  Physical Exam:  Vital signs in last 24 hours: Temp:  [97.7 F (36.5 C)-98.8 F (37.1 C)] 97.9 F (36.6 C) (06/03 1100) Pulse Rate:  [55-127] 56 (06/03 1100) Resp:  [0-23] 18 (06/03 1100) BP: (90-136)/(41-77) 125/64 mmHg (06/03 1100) SpO2:  [87 %-100 %] 90 % (06/03 1100) Weight:  [108 kg (238 lb 1.6 oz)] 108 kg (238 lb 1.6 oz) (06/03 0359)  Cardiovascular: Skin warm; not flushed Respiratory: Breaths quiet; no shortness of breath Abdomen: No masses Neurological: Normal sensation to touch Musculoskeletal: Normal motor function arms and legs Lymphatics: No inguinal adenopathy Skin: No rashes Genitourinary:no CVA tenderness  Laboratory Data:  Results for orders placed during the hospital encounter of 12/18/13 (from the past 72 hour(s))  PRO B NATRIURETIC  PEPTIDE     Status: None   Collection Time    12/18/13 11:58 AM      Result Value Ref Range   Pro B Natriuretic peptide (BNP) 54.7  0 - 125 pg/mL  CBC     Status: Abnormal   Collection Time    12/18/13 12:03 PM      Result Value Ref Range   WBC 11.3 (*) 4.0 - 10.5 K/uL   RBC 5.41  4.22 - 5.81 MIL/uL   Hemoglobin 15.5  13.0 - 17.0 g/dL   HCT 45.4  39.0 - 52.0 %   MCV 83.9  78.0 - 100.0 fL   MCH 28.7  26.0 - 34.0 pg   MCHC 34.1  30.0 - 36.0 g/dL   RDW 14.3  11.5 - 15.5 %   Platelets 228  150 - 400 K/uL  BASIC METABOLIC PANEL     Status:  Abnormal   Collection Time    12/18/13 12:03 PM      Result Value Ref Range   Sodium 137  137 - 147 mEq/L   Potassium 4.2  3.7 - 5.3 mEq/L   Chloride 99  96 - 112 mEq/L   CO2 23  19 - 32 mEq/L   Glucose, Bld 190 (*) 70 - 99 mg/dL   BUN 13  6 - 23 mg/dL   Creatinine, Ser 0.98  0.50 - 1.35 mg/dL   Calcium 9.4  8.4 - 10.5 mg/dL   GFR calc non Af Amer 89 (*) >90 mL/min   GFR calc Af Amer >90  >90 mL/min   Comment: (NOTE)     The eGFR has been calculated using the CKD EPI equation.     This calculation has not been validated in all clinical situations.     eGFR's persistently <90 mL/min signify possible Chronic Kidney     Disease.  Randolm Idol, ED     Status: None   Collection Time    12/18/13 12:12 PM      Result Value Ref Range   Troponin i, poc 0.00  0.00 - 0.08 ng/mL   Comment 3            Comment: Due to the release kinetics of cTnI,     a negative result within the first hours     of the onset of symptoms does not rule out     myocardial infarction with certainty.     If myocardial infarction is still suspected,     repeat the test at appropriate intervals.  URINE RAPID DRUG SCREEN (HOSP PERFORMED)     Status: Abnormal   Collection Time    12/18/13  2:01 PM      Result Value Ref Range   Opiates NONE DETECTED  NONE DETECTED   Cocaine POSITIVE (*) NONE DETECTED   Benzodiazepines NONE DETECTED  NONE DETECTED   Amphetamines NONE DETECTED  NONE DETECTED   Tetrahydrocannabinol NONE DETECTED  NONE DETECTED   Barbiturates NONE DETECTED  NONE DETECTED   Comment:            DRUG SCREEN FOR MEDICAL PURPOSES     ONLY.  IF CONFIRMATION IS NEEDED     FOR ANY PURPOSE, NOTIFY LAB     WITHIN 5 DAYS.                LOWEST DETECTABLE LIMITS     FOR URINE DRUG SCREEN     Drug Class       Cutoff (  ng/mL)     Amphetamine      1000     Barbiturate      200     Benzodiazepine   200     Tricyclics       300     Opiates          300     Cocaine          300     THC              50   MRSA PCR SCREENING     Status: None   Collection Time    12/18/13  7:04 PM      Result Value Ref Range   MRSA by PCR NEGATIVE  NEGATIVE   Comment:            The GeneXpert MRSA Assay (FDA     approved for NASAL specimens     only), is one component of a     comprehensive MRSA colonization     surveillance program. It is not     intended to diagnose MRSA     infection nor to guide or     monitor treatment for     MRSA infections.  TROPONIN I     Status: None   Collection Time    12/18/13  7:50 PM      Result Value Ref Range   Troponin I <0.30  <0.30 ng/mL   Comment:            Due to the release kinetics of cTnI,     a negative result within the first hours     of the onset of symptoms does not rule out     myocardial infarction with certainty.     If myocardial infarction is still suspected,     repeat the test at appropriate intervals.  TSH     Status: Abnormal   Collection Time    12/18/13  7:50 PM      Result Value Ref Range   TSH 0.331 (*) 0.350 - 4.500 uIU/mL  GLUCOSE, CAPILLARY     Status: Abnormal   Collection Time    12/18/13  8:17 PM      Result Value Ref Range   Glucose-Capillary 159 (*) 70 - 99 mg/dL   Comment 1 Notify RN    HEPARIN LEVEL (UNFRACTIONATED)     Status: None   Collection Time    12/18/13 10:46 PM      Result Value Ref Range   Heparin Unfractionated 0.43  0.30 - 0.70 IU/mL   Comment:            IF HEPARIN RESULTS ARE BELOW     EXPECTED VALUES, AND PATIENT     DOSAGE HAS BEEN CONFIRMED,     SUGGEST FOLLOW UP TESTING     OF ANTITHROMBIN III LEVELS.  HEPARIN LEVEL (UNFRACTIONATED)     Status: None   Collection Time    12/19/13  2:20 AM      Result Value Ref Range   Heparin Unfractionated 0.38  0.30 - 0.70 IU/mL   Comment:            IF HEPARIN RESULTS ARE BELOW     EXPECTED VALUES, AND PATIENT     DOSAGE HAS BEEN CONFIRMED,     SUGGEST FOLLOW UP TESTING     OF ANTITHROMBIN III LEVELS.  CBC     Status: Abnormal   Collection Time  12/19/13  2:20 AM      Result Value Ref Range   WBC 14.8 (*) 4.0 - 10.5 K/uL   RBC 5.32  4.22 - 5.81 MIL/uL   Hemoglobin 15.3  13.0 - 17.0 g/dL   HCT 43.9  39.0 - 52.0 %   MCV 82.5  78.0 - 100.0 fL   MCH 28.8  26.0 - 34.0 pg   MCHC 34.9  30.0 - 36.0 g/dL   RDW 14.0  11.5 - 15.5 %   Platelets 243  150 - 400 K/uL  TROPONIN I     Status: None   Collection Time    12/19/13  2:20 AM      Result Value Ref Range   Troponin I <0.30  <0.30 ng/mL   Comment:            Due to the release kinetics of cTnI,     a negative result within the first hours     of the onset of symptoms does not rule out     myocardial infarction with certainty.     If myocardial infarction is still suspected,     repeat the test at appropriate intervals.  COMPREHENSIVE METABOLIC PANEL     Status: Abnormal   Collection Time    12/19/13  2:20 AM      Result Value Ref Range   Sodium 133 (*) 137 - 147 mEq/L   Potassium 4.3  3.7 - 5.3 mEq/L   Chloride 96  96 - 112 mEq/L   CO2 24  19 - 32 mEq/L   Glucose, Bld 177 (*) 70 - 99 mg/dL   BUN 9  6 - 23 mg/dL   Creatinine, Ser 0.83  0.50 - 1.35 mg/dL   Calcium 9.5  8.4 - 10.5 mg/dL   Total Protein 7.9  6.0 - 8.3 g/dL   Albumin 3.7  3.5 - 5.2 g/dL   AST 15  0 - 37 U/L   ALT 17  0 - 53 U/L   Alkaline Phosphatase 92  39 - 117 U/L   Total Bilirubin 0.5  0.3 - 1.2 mg/dL   GFR calc non Af Amer >90  >90 mL/min   GFR calc Af Amer >90  >90 mL/min   Comment: (NOTE)     The eGFR has been calculated using the CKD EPI equation.     This calculation has not been validated in all clinical situations.     eGFR's persistently <90 mL/min signify possible Chronic Kidney     Disease.  LIPID PANEL     Status: Abnormal   Collection Time    12/19/13  2:20 AM      Result Value Ref Range   Cholesterol 128  0 - 200 mg/dL   Triglycerides 152 (*) <150 mg/dL   HDL 31 (*) >39 mg/dL   Total CHOL/HDL Ratio 4.1     VLDL 30  0 - 40 mg/dL   LDL Cholesterol 67  0 - 99 mg/dL   Comment:             Total Cholesterol/HDL:CHD Risk     Coronary Heart Disease Risk Table                         Men   Women      1/2 Average Risk   3.4   3.3      Average Risk       5.0   4.4  2 X Average Risk   9.6   7.1      3 X Average Risk  23.4   11.0                Use the calculated Patient Ratio     above and the CHD Risk Table     to determine the patient's CHD Risk.                ATP III CLASSIFICATION (LDL):      <100     mg/dL   Optimal      100-129  mg/dL   Near or Above                        Optimal      130-159  mg/dL   Borderline      160-189  mg/dL   High      >190     mg/dL   Very High  TROPONIN I     Status: None   Collection Time    12/19/13  7:22 AM      Result Value Ref Range   Troponin I <0.30  <0.30 ng/mL   Comment:            Due to the release kinetics of cTnI,     a negative result within the first hours     of the onset of symptoms does not rule out     myocardial infarction with certainty.     If myocardial infarction is still suspected,     repeat the test at appropriate intervals.  GLUCOSE, CAPILLARY     Status: Abnormal   Collection Time    12/19/13  8:17 AM      Result Value Ref Range   Glucose-Capillary 161 (*) 70 - 99 mg/dL  AMYLASE     Status: None   Collection Time    12/19/13 11:25 AM      Result Value Ref Range   Amylase 34  0 - 105 U/L  LIPASE, BLOOD     Status: None   Collection Time    12/19/13 11:25 AM      Result Value Ref Range   Lipase 40  11 - 59 U/L  CBC WITH DIFFERENTIAL     Status: Abnormal   Collection Time    12/19/13 11:25 AM      Result Value Ref Range   WBC 14.3 (*) 4.0 - 10.5 K/uL   RBC 5.39  4.22 - 5.81 MIL/uL   Hemoglobin 15.6  13.0 - 17.0 g/dL   HCT 44.9  39.0 - 52.0 %   MCV 83.3  78.0 - 100.0 fL   MCH 28.9  26.0 - 34.0 pg   MCHC 34.7  30.0 - 36.0 g/dL   RDW 14.2  11.5 - 15.5 %   Platelets 235  150 - 400 K/uL   Neutrophils Relative % 80 (*) 43 - 77 %   Neutro Abs 11.4 (*) 1.7 - 7.7 K/uL   Lymphocytes  Relative 13  12 - 46 %   Lymphs Abs 1.8  0.7 - 4.0 K/uL   Monocytes Relative 7  3 - 12 %   Monocytes Absolute 1.0  0.1 - 1.0 K/uL   Eosinophils Relative 0  0 - 5 %   Eosinophils Absolute 0.1  0.0 - 0.7 K/uL   Basophils Relative 0  0 - 1 %   Basophils Absolute 0.0  0.0 - 0.1 K/uL  GLUCOSE, CAPILLARY     Status: Abnormal   Collection Time    12/19/13  1:04 PM      Result Value Ref Range   Glucose-Capillary 141 (*) 70 - 99 mg/dL  GLUCOSE, CAPILLARY     Status: Abnormal   Collection Time    12/19/13  5:04 PM      Result Value Ref Range   Glucose-Capillary 154 (*) 70 - 99 mg/dL   Comment 1 Notify RN    GLUCOSE, CAPILLARY     Status: Abnormal   Collection Time    12/19/13  9:01 PM      Result Value Ref Range   Glucose-Capillary 172 (*) 70 - 99 mg/dL   Comment 1 Notify RN    HEPARIN LEVEL (UNFRACTIONATED)     Status: None   Collection Time    12/20/13  2:13 AM      Result Value Ref Range   Heparin Unfractionated 0.42  0.30 - 0.70 IU/mL   Comment:            IF HEPARIN RESULTS ARE BELOW     EXPECTED VALUES, AND PATIENT     DOSAGE HAS BEEN CONFIRMED,     SUGGEST FOLLOW UP TESTING     OF ANTITHROMBIN III LEVELS.  CBC     Status: None   Collection Time    12/20/13  2:13 AM      Result Value Ref Range   WBC 10.1  4.0 - 10.5 K/uL   RBC 4.90  4.22 - 5.81 MIL/uL   Hemoglobin 14.2  13.0 - 17.0 g/dL   HCT 41.2  39.0 - 52.0 %   MCV 84.1  78.0 - 100.0 fL   MCH 29.0  26.0 - 34.0 pg   MCHC 34.5  30.0 - 36.0 g/dL   RDW 14.3  11.5 - 15.5 %   Platelets 221  150 - 400 K/uL  BASIC METABOLIC PANEL     Status: Abnormal   Collection Time    12/20/13  2:13 AM      Result Value Ref Range   Sodium 137  137 - 147 mEq/L   Potassium 4.0  3.7 - 5.3 mEq/L   Chloride 99  96 - 112 mEq/L   CO2 25  19 - 32 mEq/L   Glucose, Bld 229 (*) 70 - 99 mg/dL   BUN 14  6 - 23 mg/dL   Creatinine, Ser 1.15  0.50 - 1.35 mg/dL   Calcium 9.2  8.4 - 10.5 mg/dL   GFR calc non Af Amer 68 (*) >90 mL/min   GFR calc  Af Amer 79 (*) >90 mL/min   Comment: (NOTE)     The eGFR has been calculated using the CKD EPI equation.     This calculation has not been validated in all clinical situations.     eGFR's persistently <90 mL/min signify possible Chronic Kidney     Disease.  PROTIME-INR     Status: None   Collection Time    12/20/13  2:13 AM      Result Value Ref Range   Prothrombin Time 13.5  11.6 - 15.2 seconds   INR 1.05  0.00 - 1.49  GLUCOSE, CAPILLARY     Status: Abnormal   Collection Time    12/20/13  9:50 AM      Result Value Ref Range   Glucose-Capillary 133 (*) 70 - 99 mg/dL  GLUCOSE, CAPILLARY  Status: Abnormal   Collection Time    12/20/13 11:51 AM      Result Value Ref Range   Glucose-Capillary 136 (*) 70 - 99 mg/dL   Comment 1 Documented in Chart     Comment 2 Notify RN     Recent Results (from the past 240 hour(s))  MRSA PCR SCREENING     Status: None   Collection Time    12/18/13  7:04 PM      Result Value Ref Range Status   MRSA by PCR NEGATIVE  NEGATIVE Final   Comment:            The GeneXpert MRSA Assay (FDA     approved for NASAL specimens     only), is one component of a     comprehensive MRSA colonization     surveillance program. It is not     intended to diagnose MRSA     infection nor to guide or     monitor treatment for     MRSA infections.   Creatinine:  Recent Labs  12/18/13 1203 12/19/13 0220 12/20/13 0213  CREATININE 0.98 0.83 1.15    Xrays: See report/chart As noted above  Impression/Assessment:  Rt renal mass with renal cysts; to have GB out tomorrow  Plan:  Discussed findings with pt/will call general surgery; to see Dr Tresa Moore as outpt  Serafina Topham A Zuleyka Kloc 12/20/2013, 12:46 PM

## 2013-12-20 NOTE — Progress Notes (Signed)
TR Band removed per protocol. Site level 0. Gauze and tegaderm applied. VSS. Pt without complaints. Explained restrictions of right wrist/arm and pt voiced understanding. Will continue to monitor.

## 2013-12-20 NOTE — CV Procedure (Signed)
      Cardiac Catheterization Operative Report  [name] [id number] 6/3/20151:16 PM Jacqualine Mau, PA-C  Procedure Performed:  1. Left Heart Catheterization 2. Selective Coronary Angiography 3. Left ventricular angiogram  Operator: Lauree Chandler, MD  Arterial access site:  Right radial artery.   Indication:  58 yo male with history of CAD, DES mid LAD 8/14, ongoing long term tobacco abuse, cocaine abuse (last 2 days ago), HTN, HLD, COPD admitted with chest pain/abdominal pain.  Found to have acute cholecystitis, renal mass suspicious for RCC, liver lesions. Cardiac cath to exclude progression of CAD.                                     Procedure Details: The risks, benefits, complications, treatment options, and expected outcomes were discussed with the patient. The patient and/or family concurred with the proposed plan, giving informed consent. The patient was brought to the cath lab after IV hydration was begun and oral premedication was given. The patient was further sedated with Versed and Fentanyl. The right wrist was assessed with an Allens test which was positive. The right wrist was prepped and draped in a sterile fashion. 1% lidocaine was used for local anesthesia. Using the modified Seldinger access technique, a 5/6 French sheath was placed in the right radial artery. 3 mg Verapamil was given through the sheath. 5000 units IV heparin was given. Standard diagnostic catheters were used to perform selective coronary angiography. A pigtail catheter was used to perform a left ventricular angiogram. The sheath was removed from the right radial artery and a Terumo hemostasis band was applied at the arteriotomy site on the right wrist.    There were no immediate complications. The patient was taken to the recovery area in stable condition.   Hemodynamic Findings: Central aortic pressure: 120/62 Left ventricular pressure: 127/7/15  Angiographic Findings:  Left main:  Short segment. No obstructive disease.   Left Anterior Descending Artery: Moderate caliber vessel that courses to the apex. There is 40% proximal stenosis. The mid stented segment is patent without restenosis. The distal vessel has diffuse non-obstructive plaque. The diagonal branches are small in caliber with no obstructive disease.   Circumflex Artery: Large dominant system with large bifurcating first obtuse marginal branch and three small caliber posterolateral branches. The OM branch has diffuse 50% proximal stenosis, no focally obstructive lesions. The three small caliber posterolateral branches all have diffuse moderate to severe stenosis that is unchanged from last cath.   Right Coronary Artery: Small non-dominant vessel with 40% mid vessel stenosis.   Left Ventricular Angiogram: LVEF=65%  Impression: 1. Double vessel CAD with patent stent mid LAD 2. Moderate non-obstructive disease in the Circumflex and RCA 3. Normal LV systolic function 4. Ongoing tobacco abuse 5. Ongoing cocaine abuse  Recommendations: Continue medical management of CAD. It is likely that his exertional chest pain is related to small vessel disease that is worsened with cocaine use and tobacco use. Complete cessation of cocaine and tobacco recommended. Would proceed with surgery as planned.        Complications:  None. The patient tolerated the procedure well.

## 2013-12-20 NOTE — Consult Note (Signed)
[name] Nov 25, 1955  [id number].   Primary Care MD: Soyla Dryer, PA-C Requesting MD: Dr. Candee Furbish Chief Complaint/Reason for Consult: acute cholecystitis HPI: This is a 58 yo white male with a h/o cocaine abuse, hepatitis, CAD, who underwent stenting of his LAD in August of 2014.  He began having back pain, chest pain, and shortness of breath on Monday morning.  He has never had pain like this and states it is different from his normal chest pain.  He presented to [facility] for evaluation.  He was admitted to cardiology for cardiac workup given his history.  His troponins were negative.  He underwent an MRI of his abdomen because then he complained of RUQ abdominal pain.  This revealed a stone in the gallbladder neck with wall thickening and pericholecystic fluid.  It also revealed a mass on his right kidney.  We have been asked to evaluate the patient for his gallbladder findings.    ROS: Please see HPI, otherwise the patient admits to nausea in ED, but resolved.  He admits to chest pain and SOB with any movement.  He sleeps on 3-4 pillows at night due to SOB.  He states he can walk around his house without chest pain, but if he walks any further, like to his back yard or mailbox, he gets CP and SOB.  Otherwise all other systems are currently negative.  Family History  Problem Relation Age of Onset  . Hypertension    . Diabetes    . Stroke    . Lung cancer Father     died @ 64.  . Cancer Mother     Thyroid - living in her 67's.  . Cancer Maternal Grandmother     Breast  . Cancer Maternal Grandfather     Throat and stomach  . CAD Father   . CAD Brother     alive @ 69 s/p stenting in his 40's.    Past Medical History  Diagnosis Date  . Hypertension   . TIA (transient ischemic attack) 1997    No residual neurological deficits.  . Hepatitis Late 1970s    Treated in the hospital, finished treatment course. No recurrence  since  . Noncompliance   . Coronary atherosclerosis of  native coronary artery     a. 02/2013 Cath/PCI: LM nl, LAD: 50p, 20m(2.5x16 promus DES), LCX nl, OM1 min irregs, LPL/LPDA diff dzs, RCA nondom, mod diff dzs, EF 55%.  .Marland KitchenCOPD (chronic obstructive pulmonary disease)   . Hyperlipidemia   . History of pneumonia     "3-4 times" (03/09/2013)  . Type 2 diabetes mellitus 2011  . GERD (gastroesophageal reflux disease)   . Arthritis     "hands, knees" (03/09/2013)  . Substance abuse   . Abdominal ultrasound, abnormal     a. 11/2013: 9cm RUQ complex cystic lesion in the area of the GB fossa. Diff hepatic steatosis. 1.8cm indeterminate hypoechoic lesion in L hepatic lobe-->Abd MRI w/wo contrast pending.    Past Surgical History  Procedure Laterality Date  . Cardiac catheterization  05/2012  . Coronary angioplasty with stent placement  03/09/2013    "1" (03/09/2013)  . Appendectomy  1970's    Social History:  reports that he has been smoking Cigarettes.  He has a 41 pack-year smoking history. He has never used smokeless tobacco. He reports that he does not drink alcohol or use illicit drugs.  Allergies: No Known Allergies  Medications Prior to Admission  Medication Sig Dispense Refill  .  acetaminophen (TYLENOL) 500 MG tablet Take 1,000 mg by mouth every 8 (eight) hours as needed for moderate pain.      [name] aspirin 81 MG chewable tablet Chew 1 tablet (81 mg total) by mouth daily.  30 tablet  2  . clopidogrel (PLAVIX) 75 MG tablet Take 1 tablet (75 mg total) by mouth daily with breakfast.  30 tablet  5  . diltiazem (CARDIZEM CD) 240 MG 24 hr capsule Take 1 capsule (240 mg total) by mouth daily.  30 capsule  2  . fish oil-omega-3 fatty acids 1000 MG capsule Take 1 g by mouth 4 (four) times daily.      [name] gabapentin (NEURONTIN) 100 MG capsule Take 300 mg by mouth at bedtime.       [name] glipiZIDE (GLUCOTROL) 5 MG tablet Take 2.5 mg by mouth 2 (two) times daily before a meal.      . isosorbide dinitrate (ISORDIL) 30 MG tablet Take 60 mg by mouth 2 (two) times  daily.      [name] lisinopril (PRINIVIL,ZESTRIL) 5 MG tablet Take 5 mg by mouth daily.      . nitroGLYCERIN (NITROSTAT) 0.4 MG SL tablet Place 1 tablet (0.4 mg total) under the tongue every 5 (five) minutes as needed for chest pain.  30 tablet  1  . ranitidine (ZANTAC) 300 MG tablet Take 300 mg by mouth at bedtime.      . rosuvastatin (CRESTOR) 20 MG tablet Take 20 mg by mouth at bedtime.      . sitaGLIPtin (JANUVIA) 100 MG tablet Take 100 mg by mouth daily.        Blood pressure 125/64, pulse 56, temperature 97.9 F (36.6 C), temperature source Oral, resp. rate 18, height _0  (1.753 m), weight 238 lb 1.6 oz (108 kg), SpO2 90.00%. Physical Exam: General: obese white male who is laying in bed in NAD HEENT: head is normocephalic, atraumatic.  Sclera are noninjected.  PERRL.  Ears and nose without any masses or lesions.  Mouth is pink and moist Heart: regular, rate, and rhythm.  Normal s1,s2. No obvious murmurs, gallops, or rubs noted.  Palpable radial and pedal pulses bilaterally Lungs: CTAB, no wheezes, rhonchi, or rales noted.  Respiratory effort nonlabored Abd: soft, quite tender in the RUQ extending some into his RLQ as well, ND, but obese, +BS, no masses, hernias, or organomegaly, RLQ scar noted from prior appendectomy MS: all 4 extremities are symmetrical with no cyanosis, clubbing, or edema. Skin: warm and dry with no masses, lesions, or rashes Psych: A&Ox3 with an appropriate affect.    Results for orders placed during the hospital encounter of 12/18/13 (from the past 48 hour(s))  PRO B NATRIURETIC PEPTIDE     Status: None   Collection Time    12/18/13 11:58 AM      Result Value Ref Range   Pro B Natriuretic peptide (BNP) 54.7  0 - 125 pg/mL  CBC     Status: Abnormal   Collection Time    12/18/13 12:03 PM      Result Value Ref Range   WBC 11.3 (*) 4.0 - 10.5 K/uL   RBC 5.41  4.22 - 5.81 MIL/uL   Hemoglobin 15.5  13.0 - 17.0 g/dL   HCT 45.4  39.0 - 52.0 %   MCV 83.9  78.0 -  100.0 fL   MCH 28.7  26.0 - 34.0 pg   MCHC 34.1  30.0 - 36.0 g/dL   RDW 14.3  11.5 -  15.5 %   Platelets 228  150 - 400 K/uL  BASIC METABOLIC PANEL     Status: Abnormal   Collection Time    12/18/13 12:03 PM      Result Value Ref Range   Sodium 137  137 - 147 mEq/L   Potassium 4.2  3.7 - 5.3 mEq/L   Chloride 99  96 - 112 mEq/L   CO2 23  19 - 32 mEq/L   Glucose, Bld 190 (*) 70 - 99 mg/dL   BUN 13  6 - 23 mg/dL   Creatinine, Ser 0.98  0.50 - 1.35 mg/dL   Calcium 9.4  8.4 - 10.5 mg/dL   GFR calc non Af Amer 89 (*) >90 mL/min   GFR calc Af Amer >90  >90 mL/min   Comment: (NOTE)     The eGFR has been calculated using the CKD EPI equation.     This calculation has not been validated in all clinical situations.     eGFR's persistently <90 mL/min signify possible Chronic Kidney     Disease.  Randolm Idol, ED     Status: None   Collection Time    12/18/13 12:12 PM      Result Value Ref Range   Troponin i, poc 0.00  0.00 - 0.08 ng/mL   Comment 3            Comment: Due to the release kinetics of cTnI,     a negative result within the first hours     of the onset of symptoms does not rule out     myocardial infarction with certainty.     If myocardial infarction is still suspected,     repeat the test at appropriate intervals.  URINE RAPID DRUG SCREEN (HOSP PERFORMED)     Status: Abnormal   Collection Time    12/18/13  2:01 PM      Result Value Ref Range   Opiates NONE DETECTED  NONE DETECTED   Cocaine POSITIVE (*) NONE DETECTED   Benzodiazepines NONE DETECTED  NONE DETECTED   Amphetamines NONE DETECTED  NONE DETECTED   Tetrahydrocannabinol NONE DETECTED  NONE DETECTED   Barbiturates NONE DETECTED  NONE DETECTED   Comment:            DRUG SCREEN FOR MEDICAL PURPOSES     ONLY.  IF CONFIRMATION IS NEEDED     FOR ANY PURPOSE, NOTIFY LAB     WITHIN 5 DAYS.                LOWEST DETECTABLE LIMITS     FOR URINE DRUG SCREEN     Drug Class       Cutoff (ng/mL)     Amphetamine       1000     Barbiturate      200     Benzodiazepine   229     Tricyclics       798     Opiates          300     Cocaine          300     THC              50  MRSA PCR SCREENING     Status: None   Collection Time    12/18/13  7:04 PM      Result Value Ref Range   MRSA by PCR NEGATIVE  NEGATIVE   Comment:  The GeneXpert MRSA Assay (FDA     approved for NASAL specimens     only), is one component of a     comprehensive MRSA colonization     surveillance program. It is not     intended to diagnose MRSA     infection nor to guide or     monitor treatment for     MRSA infections.  TROPONIN I     Status: None   Collection Time    12/18/13  7:50 PM      Result Value Ref Range   Troponin I <0.30  <0.30 ng/mL   Comment:            Due to the release kinetics of cTnI,     a negative result within the first hours     of the onset of symptoms does not rule out     myocardial infarction with certainty.     If myocardial infarction is still suspected,     repeat the test at appropriate intervals.  TSH     Status: Abnormal   Collection Time    12/18/13  7:50 PM      Result Value Ref Range   TSH 0.331 (*) 0.350 - 4.500 uIU/mL  GLUCOSE, CAPILLARY     Status: Abnormal   Collection Time    12/18/13  8:17 PM      Result Value Ref Range   Glucose-Capillary 159 (*) 70 - 99 mg/dL   Comment 1 Notify RN    HEPARIN LEVEL (UNFRACTIONATED)     Status: None   Collection Time    12/18/13 10:46 PM      Result Value Ref Range   Heparin Unfractionated 0.43  0.30 - 0.70 IU/mL   Comment:            IF HEPARIN RESULTS ARE BELOW     EXPECTED VALUES, AND PATIENT     DOSAGE HAS BEEN CONFIRMED,     SUGGEST FOLLOW UP TESTING     OF ANTITHROMBIN III LEVELS.  HEPARIN LEVEL (UNFRACTIONATED)     Status: None   Collection Time    12/19/13  2:20 AM      Result Value Ref Range   Heparin Unfractionated 0.38  0.30 - 0.70 IU/mL   Comment:            IF HEPARIN RESULTS ARE BELOW     EXPECTED  VALUES, AND PATIENT     DOSAGE HAS BEEN CONFIRMED,     SUGGEST FOLLOW UP TESTING     OF ANTITHROMBIN III LEVELS.  CBC     Status: Abnormal   Collection Time    12/19/13  2:20 AM      Result Value Ref Range   WBC 14.8 (*) 4.0 - 10.5 K/uL   RBC 5.32  4.22 - 5.81 MIL/uL   Hemoglobin 15.3  13.0 - 17.0 g/dL   HCT 43.9  39.0 - 52.0 %   MCV 82.5  78.0 - 100.0 fL   MCH 28.8  26.0 - 34.0 pg   MCHC 34.9  30.0 - 36.0 g/dL   RDW 14.0  11.5 - 15.5 %   Platelets 243  150 - 400 K/uL  TROPONIN I     Status: None   Collection Time    12/19/13  2:20 AM      Result Value Ref Range   Troponin I <0.30  <0.30 ng/mL   Comment:  Due to the release kinetics of cTnI,     a negative result within the first hours     of the onset of symptoms does not rule out     myocardial infarction with certainty.     If myocardial infarction is still suspected,     repeat the test at appropriate intervals.  COMPREHENSIVE METABOLIC PANEL     Status: Abnormal   Collection Time    12/19/13  2:20 AM      Result Value Ref Range   Sodium 133 (*) 137 - 147 mEq/L   Potassium 4.3  3.7 - 5.3 mEq/L   Chloride 96  96 - 112 mEq/L   CO2 24  19 - 32 mEq/L   Glucose, Bld 177 (*) 70 - 99 mg/dL   BUN 9  6 - 23 mg/dL   Creatinine, Ser 0.83  0.50 - 1.35 mg/dL   Calcium 9.5  8.4 - 10.5 mg/dL   Total Protein 7.9  6.0 - 8.3 g/dL   Albumin 3.7  3.5 - 5.2 g/dL   AST 15  0 - 37 U/L   ALT 17  0 - 53 U/L   Alkaline Phosphatase 92  39 - 117 U/L   Total Bilirubin 0.5  0.3 - 1.2 mg/dL   GFR calc non Af Amer >90  >90 mL/min   GFR calc Af Amer >90  >90 mL/min   Comment: (NOTE)     The eGFR has been calculated using the CKD EPI equation.     This calculation has not been validated in all clinical situations.     eGFR's persistently <90 mL/min signify possible Chronic Kidney     Disease.  LIPID PANEL     Status: Abnormal   Collection Time    12/19/13  2:20 AM      Result Value Ref Range   Cholesterol 128  0 - 200 mg/dL    Triglycerides 152 (*) <150 mg/dL   HDL 31 (*) >39 mg/dL   Total CHOL/HDL Ratio 4.1     VLDL 30  0 - 40 mg/dL   LDL Cholesterol 67  0 - 99 mg/dL   Comment:            Total Cholesterol/HDL:CHD Risk     Coronary Heart Disease Risk Table                         Men   Women      1/2 Average Risk   3.4   3.3      Average Risk       5.0   4.4      2 X Average Risk   9.6   7.1      3 X Average Risk  23.4   11.0                Use the calculated Patient Ratio     above and the CHD Risk Table     to determine the patient's CHD Risk.                ATP III CLASSIFICATION (LDL):      <100     mg/dL   Optimal      100-129  mg/dL   Near or Above                        Optimal      130-159  mg/dL   Borderline      160-189  mg/dL   High      >190     mg/dL   Very High  TROPONIN I     Status: None   Collection Time    12/19/13  7:22 AM      Result Value Ref Range   Troponin I <0.30  <0.30 ng/mL   Comment:            Due to the release kinetics of cTnI,     a negative result within the first hours     of the onset of symptoms does not rule out     myocardial infarction with certainty.     If myocardial infarction is still suspected,     repeat the test at appropriate intervals.  GLUCOSE, CAPILLARY     Status: Abnormal   Collection Time    12/19/13  8:17 AM      Result Value Ref Range   Glucose-Capillary 161 (*) 70 - 99 mg/dL  AMYLASE     Status: None   Collection Time    12/19/13 11:25 AM      Result Value Ref Range   Amylase 34  0 - 105 U/L  LIPASE, BLOOD     Status: None   Collection Time    12/19/13 11:25 AM      Result Value Ref Range   Lipase 40  11 - 59 U/L  CBC WITH DIFFERENTIAL     Status: Abnormal   Collection Time    12/19/13 11:25 AM      Result Value Ref Range   WBC 14.3 (*) 4.0 - 10.5 K/uL   RBC 5.39  4.22 - 5.81 MIL/uL   Hemoglobin 15.6  13.0 - 17.0 g/dL   HCT 44.9  39.0 - 52.0 %   MCV 83.3  78.0 - 100.0 fL   MCH 28.9  26.0 - 34.0 pg   MCHC 34.7  30.0 - 36.0  g/dL   RDW 14.2  11.5 - 15.5 %   Platelets 235  150 - 400 K/uL   Neutrophils Relative % 80 (*) 43 - 77 %   Neutro Abs 11.4 (*) 1.7 - 7.7 K/uL   Lymphocytes Relative 13  12 - 46 %   Lymphs Abs 1.8  0.7 - 4.0 K/uL   Monocytes Relative 7  3 - 12 %   Monocytes Absolute 1.0  0.1 - 1.0 K/uL   Eosinophils Relative 0  0 - 5 %   Eosinophils Absolute 0.1  0.0 - 0.7 K/uL   Basophils Relative 0  0 - 1 %   Basophils Absolute 0.0  0.0 - 0.1 K/uL  GLUCOSE, CAPILLARY     Status: Abnormal   Collection Time    12/19/13  1:04 PM      Result Value Ref Range   Glucose-Capillary 141 (*) 70 - 99 mg/dL  GLUCOSE, CAPILLARY     Status: Abnormal   Collection Time    12/19/13  5:04 PM      Result Value Ref Range   Glucose-Capillary 154 (*) 70 - 99 mg/dL   Comment 1 Notify RN    GLUCOSE, CAPILLARY     Status: Abnormal   Collection Time    12/19/13  9:01 PM      Result Value Ref Range   Glucose-Capillary 172 (*) 70 - 99 mg/dL   Comment 1 Notify RN    HEPARIN LEVEL (UNFRACTIONATED)  Status: None   Collection Time    12/20/13  2:13 AM      Result Value Ref Range   Heparin Unfractionated 0.42  0.30 - 0.70 IU/mL   Comment:            IF HEPARIN RESULTS ARE BELOW     EXPECTED VALUES, AND PATIENT     DOSAGE HAS BEEN CONFIRMED,     SUGGEST FOLLOW UP TESTING     OF ANTITHROMBIN III LEVELS.  CBC     Status: None   Collection Time    12/20/13  2:13 AM      Result Value Ref Range   WBC 10.1  4.0 - 10.5 K/uL   RBC 4.90  4.22 - 5.81 MIL/uL   Hemoglobin 14.2  13.0 - 17.0 g/dL   HCT 41.2  39.0 - 52.0 %   MCV 84.1  78.0 - 100.0 fL   MCH 29.0  26.0 - 34.0 pg   MCHC 34.5  30.0 - 36.0 g/dL   RDW 14.3  11.5 - 15.5 %   Platelets 221  150 - 400 K/uL  BASIC METABOLIC PANEL     Status: Abnormal   Collection Time    12/20/13  2:13 AM      Result Value Ref Range   Sodium 137  137 - 147 mEq/L   Potassium 4.0  3.7 - 5.3 mEq/L   Chloride 99  96 - 112 mEq/L   CO2 25  19 - 32 mEq/L   Glucose, Bld 229 (*) 70 - 99  mg/dL   BUN 14  6 - 23 mg/dL   Creatinine, Ser 1.15  0.50 - 1.35 mg/dL   Calcium 9.2  8.4 - 10.5 mg/dL   GFR calc non Af Amer 68 (*) >90 mL/min   GFR calc Af Amer 79 (*) >90 mL/min   Comment: (NOTE)     The eGFR has been calculated using the CKD EPI equation.     This calculation has not been validated in all clinical situations.     eGFR's persistently <90 mL/min signify possible Chronic Kidney     Disease.  PROTIME-INR     Status: None   Collection Time    12/20/13  2:13 AM      Result Value Ref Range   Prothrombin Time 13.5  11.6 - 15.2 seconds   INR 1.05  0.00 - 1.49  GLUCOSE, CAPILLARY     Status: Abnormal   Collection Time    12/20/13  9:50 AM      Result Value Ref Range   Glucose-Capillary 133 (*) 70 - 99 mg/dL   Mr Abdomen W Wo Contrast  12/20/2013   CLINICAL DATA:  Possible gallbladder mass on prior ultrasound, right upper quadrant abdominal pain  EXAM: MRI ABDOMEN WITHOUT AND WITH CONTRAST  TECHNIQUE: Multiplanar multisequence MR imaging of the abdomen was performed both before and after the administration of intravenous contrast.  CONTRAST:  5m MULTIHANCE GADOBENATE DIMEGLUMINE 529 MG/ML IV SOLN  COMPARISON:  Abdominal ultrasound 11/22/2013  FINDINGS: Curvilinear atelectasis is noted at the right lung base.  Dependent hypo intense probable stones or sludge noted within the gallbladder neck image 26. There is mild gallbladder wall thickening and surrounding pericholecystic edema. No intrahepatic ductal dilatation is identified. Common duct and pancreatic duct are normal in caliber.  4.2 cm lateral segment left hepatic lobe cyst incidentally noted image 12 series 3. 1.6 cm mildly T2 hyperintense posterior segment right hepatic lobe mass is identified  demonstrating progressive nodular centripetal enhancement compatible with a hemangioma. Hepatic steatosis noted.  Bilateral renal cortical T2 hyperintense cysts are noted. 7 mm left upper pole renal cortical proteinaceous cyst  incidentally noted.  In the inferior aspect of the right kidney, there is a complex solid/ cystic mass measuring 4.2 x 3.3 cm with mixed internal signal. Enhancement is noted within a 3 cm central solid component. When measured including the cystic and solid component, the right lower renal pole mass measures overall 8.8 x 7.8 x 6.7 cm. An adjacent exophytic right lower renal pole dominant cyst with internal debris or possibly peripheral thin linear septation measures 8.3 cm maximally image 24 series 4. No abnormal enhancement is identified in the renal vein or IVC. Although not completely imaged on all sequences, no evidence for adjacent colonic or perinephric invasion. No hydroureteronephrosis. No lymphadenopathy or ascites.  Adrenal glands, spleen, and pancreas appear normal.  IMPRESSION: Probable stone or sludge noted within the gallbladder neck, with mild gallbladder wall thickening and pericholecystic edema, suspicious for cholecystitis. No intrahepatic, common duct, or pancreatic ductal dilatation.  Highly suspicious cystic/solid right lower renal pole exophytic mass with imaging features highly suggestive of renal cell carcinoma or possibly cystic nephroma. Urology consultation and general surgery consultation recommended.  Right hepatic lobe hemangioma incidentally noted. Hepatic steatosis.  These results will be called to the ordering clinician or representative by the Radiologist Assistant, and communication documented in the PACS or zVision Dashboard.   Electronically Signed   By: Conchita Paris M.D.   On: 12/20/2013 10:42   Dg Chest Port 1 View  12/18/2013   CLINICAL DATA:  Chest pain  EXAM: PORTABLE CHEST - 1 VIEW  COMPARISON:  March 08, 2013  FINDINGS: There is a small area of infiltrate in the right base. Elsewhere lungs are clear. Heart is mildly enlarged with normal pulmonary vascularity. No adenopathy. No bone lesions.  IMPRESSION: Right base infiltrate. Lungs elsewhere clear. Stable cardiac  enlargement.   Electronically Signed   By: Lowella Grip M.D.   On: 12/18/2013 12:32       Assessment/Plan 1. Cholecystitis 2. CAD, s/p DES placement in August 2014 3. Exertional chest pain and SOB 4. Right kidney mass Patient Active Problem List   Diagnosis Date Noted  . Midsternal chest pain 12/18/2013  . Abdominal pain of unknown cause 11/14/2013  . Coronary atherosclerosis of native coronary artery   . Type 2 diabetes mellitus   . Substance abuse   . Hyperlipidemia   . CAD (coronary artery disease), native coronary artery 11/04/2012  . Generalized anxiety disorder 11/04/2012  . Lipids abnormal 11/04/2012  . Diabetes mellitus type 2, uncontrolled, with cardiovascular complications 20/25/4270  . Tobacco abuse 06/01/2012  . H/O medication noncompliance 06/01/2012  . Cocaine abuse 06/01/2012  . Unstable angina 06/01/2012  . Essential hypertension, benign    Plan: 1. Given the patient's history of exertional chest pain and SOB, I have discussed him with Dr. Marlou Porch.  He will go ahead and proceed with a diagnostic cath prior to surgical intervention.  His plavix will be stopped today.  He felt as if he was close enough to his one year anniversary of stent placement that his thrombogenic risk was relatively low.  If the patient's cath is negative and no other complications noted, we will plan on pursuing surgical resection of his gallbladder on Friday.  If his cath is + or there are some acute findings that place him at very high surgical risk, we can  always consider placement of percutaneous cholecystostomy drain to temporize him. 2. We will follow closely to determine further plans after his catheterization.  He may have clear liquids from our standpoint after his cath. 3. Thank you for this consultation.  Henreitta Cea 12/20/2013, 11:54 AM Pager: [phone]

## 2013-12-20 NOTE — Care Management Note (Addendum)
    Page 1 of 1   12/22/2013     3:04:47 PM CARE MANAGEMENT NOTE 12/22/2013  Patient:  [name], [name]   Account Number:  [id number]  Date Initiated:  12/20/2013  Documentation initiated by:  Mariann Laster  Subjective/Objective Assessment:   Chest pain     Action/Plan:   CM to follow for disposition needs   Anticipated DC Date:  12/23/2013   Anticipated DC Plan:  HOME/SELF CARE         Choice offered to / List presented to:             Status of service:  Completed, signed off Medicare Important Message given?   (If response is "NO", the following Medicare IM given date fields will be blank) Date Medicare IM given:   Date Additional Medicare IM given:    Discharge Disposition:    Per UR Regulation:  Reviewed for med. necessity/level of care/duration of stay  If discussed at Long Length of Stay Meetings, dates discussed:    Comments:  12-22-13 1452 Jacqlyn Krauss, RN,BSN [phone] Post procedure:  laparoscopic cholecystectomy. No CM needs identified at this time.   Crystal Hutchinson RN, BSN, MSHL, CCM  Nurse - Case Manager,  [phone]  12/20/2013 Per hand off Report: ---12/20/2013 1450 by [facility] YOUNG--- transfer 2c/ cath/ med txproceed for gallbladder surgery/ inpt documented

## 2013-12-20 NOTE — Progress Notes (Signed)
Nurse Tasharra Nodine informed charge nurse Festus Holts that she had to go to MRI with pt from room 15 and that she and Vicente Males other nurse on side where pt's in room 13 and 18  Are would have to be watched and attended to by them. Vicente Males notified as well. Nurse replaced night nurse. Report given to day nurse that pt had accidentally pulled out one of two iv's on transport to MRI. Therefore day shift nurse called IV team to notify that an IV needed after pt finishes MRI.

## 2013-12-20 NOTE — Progress Notes (Signed)
Pt left floor to go to cath lab. Pt's cardiac cath was cancelled and then rescheduled.

## 2013-12-20 NOTE — Progress Notes (Signed)
Surgery consultation appreciated.  Obtained history of exertional chest pain with minimal effort (walking across neighbors yard) and trouble laying flat. (no complaints earlier of this).   Because of upcoming procedure and discovery of these symptoms, we will proceed with diagnostic left heart cath. Prior DES to LAD 8/14. Discussed his case with Dr. Julianne Handler. If intervention is needed, will perform. NUC stress would be challenging to interpret with body habitus.   Reviewed with patient. Discussed risks and benefits including, CVA, death, bleeding. He is willing to proceed. Questions answered.   Candee Furbish, MD

## 2013-12-20 NOTE — Progress Notes (Signed)
Transport is here to take pt to MRI. Night nurse is leaving to go with pt. Day shift nurse is finishing report and then replacing night nurse.

## 2013-12-20 NOTE — Interval H&P Note (Signed)
History and Physical Interval Note:  12/20/2013 1:10 PM  [name]  has presented today for cardiac cath with the diagnosis of cp/known CAD  The various methods of treatment have been discussed with the patient and family. After consideration of risks, benefits and other options for treatment, the patient has consented to  Procedure(s): LEFT HEART CATHETERIZATION WITH CORONARY ANGIOGRAM (N/A) as a surgical intervention .  The patient's history has been reviewed, patient examined, no change in status, stable for surgery.  I have reviewed the patient's chart and labs.  Questions were answered to the patient's satisfaction.    Cath Lab Visit (complete for each Cath Lab visit)  Clinical Evaluation Leading to the Procedure:   ACS: no  Non-ACS:    Anginal Classification: CCS III  Anti-ischemic medical therapy: Maximal Therapy (2 or more classes of medications)  Non-Invasive Test Results: No non-invasive testing performed  Prior CABG: No previous CABG         [name]

## 2013-12-20 NOTE — Consult Note (Signed)
UNASSIGNED PATIENT Reason for Consult: RUQ PAIN. Referring Physician: Dr. Elta Guadeloupe Skains-Cardiology.  [name] is an 58 y.o. male.  HPI: 58 year old white male, with multiple medical issues including acute cholecystitis with gallbladder sludge/stone awaiting surgery after a cardiac clearance. I was asked to see the patient for further evaluation of RUQ pain worse after greasy meals associated with nausea. Patient denies any other GI symptoms at this time.   Past Medical History  Diagnosis Date  . Hypertension   . TIA (transient ischemic attack) 1997    No residual neurological deficits.  . Hepatitis Late 1970s    Treated in the hospital, finished treatment course. No recurrence  since  . Noncompliance   . Coronary atherosclerosis of native coronary artery     a. 02/2013 Cath/PCI: LM nl, LAD: 50p, 19m (2.5x16 promus DES), LCX nl, OM1 min irregs, LPL/LPDA diff dzs, RCA nondom, mod diff dzs, EF 55%.  [name] COPD (chronic obstructive pulmonary disease)   . Hyperlipidemia   . History of pneumonia     "3-4 times" (03/09/2013)  . Type 2 diabetes mellitus 2011  . GERD (gastroesophageal reflux disease)   . Arthritis     "hands, knees" (03/09/2013)  . Substance abuse   . Abdominal ultrasound, abnormal     a. 11/2013: 9cm RUQ complex cystic lesion in the area of the GB fossa. Diff hepatic steatosis. 1.8cm indeterminate hypoechoic lesion in L hepatic lobe-->Abd MRI w/wo contrast pending.   Past Surgical History  Procedure Laterality Date  . Cardiac catheterization  05/2012  . Coronary angioplasty with stent placement  03/09/2013    "1" (03/09/2013)  . Appendectomy  1970's   Family History  Problem Relation Age of Onset  . Hypertension    . Diabetes    . Stroke    . Lung cancer Father     died @ 69.  . Cancer Mother     Thyroid - living in her 1's.  . Cancer Maternal Grandmother     Breast  . Cancer Maternal Grandfather     Throat and stomach  . CAD Father   . CAD Brother     alive @  64 s/p stenting in his 86's.   Social History:  reports that he has been smoking Cigarettes.  He has a 41 pack-year smoking history. He has never used smokeless tobacco. He reports that he does not drink alcohol or use illicit drugs.  Allergies: No Known Allergies  Medications: I have reviewed the patient's current medications.  Results for orders placed during the hospital encounter of 12/18/13 (from the past 48 hour(s))  GLUCOSE, CAPILLARY     Status: Abnormal   Collection Time    12/18/13  8:17 PM      Result Value Ref Range   Glucose-Capillary 159 (*) 70 - 99 mg/dL   Comment 1 Notify RN    HEPARIN LEVEL (UNFRACTIONATED)     Status: None   Collection Time    12/18/13 10:46 PM      Result Value Ref Range   Heparin Unfractionated 0.43  0.30 - 0.70 IU/mL   Comment:            IF HEPARIN RESULTS ARE BELOW     EXPECTED VALUES, AND PATIENT     DOSAGE HAS BEEN CONFIRMED,     SUGGEST FOLLOW UP TESTING     OF ANTITHROMBIN III LEVELS.  HEPARIN LEVEL (UNFRACTIONATED)     Status: None   Collection Time  12/19/13  2:20 AM      Result Value Ref Range   Heparin Unfractionated 0.38  0.30 - 0.70 IU/mL   Comment:            IF HEPARIN RESULTS ARE BELOW     EXPECTED VALUES, AND PATIENT     DOSAGE HAS BEEN CONFIRMED,     SUGGEST FOLLOW UP TESTING     OF ANTITHROMBIN III LEVELS.  CBC     Status: Abnormal   Collection Time    12/19/13  2:20 AM      Result Value Ref Range   WBC 14.8 (*) 4.0 - 10.5 K/uL   RBC 5.32  4.22 - 5.81 MIL/uL   Hemoglobin 15.3  13.0 - 17.0 g/dL   HCT 43.9  39.0 - 52.0 %   MCV 82.5  78.0 - 100.0 fL   MCH 28.8  26.0 - 34.0 pg   MCHC 34.9  30.0 - 36.0 g/dL   RDW 14.0  11.5 - 15.5 %   Platelets 243  150 - 400 K/uL  TROPONIN I     Status: None   Collection Time    12/19/13  2:20 AM      Result Value Ref Range   Troponin I <0.30  <0.30 ng/mL   Comment:            Due to the release kinetics of cTnI,     a negative result within the first hours     of the  onset of symptoms does not rule out     myocardial infarction with certainty.     If myocardial infarction is still suspected,     repeat the test at appropriate intervals.  COMPREHENSIVE METABOLIC PANEL     Status: Abnormal   Collection Time    12/19/13  2:20 AM      Result Value Ref Range   Sodium 133 (*) 137 - 147 mEq/L   Potassium 4.3  3.7 - 5.3 mEq/L   Chloride 96  96 - 112 mEq/L   CO2 24  19 - 32 mEq/L   Glucose, Bld 177 (*) 70 - 99 mg/dL   BUN 9  6 - 23 mg/dL   Creatinine, Ser 0.83  0.50 - 1.35 mg/dL   Calcium 9.5  8.4 - 10.5 mg/dL   Total Protein 7.9  6.0 - 8.3 g/dL   Albumin 3.7  3.5 - 5.2 g/dL   AST 15  0 - 37 U/L   ALT 17  0 - 53 U/L   Alkaline Phosphatase 92  39 - 117 U/L   Total Bilirubin 0.5  0.3 - 1.2 mg/dL   GFR calc non Af Amer >90  >90 mL/min   GFR calc Af Amer >90  >90 mL/min   Comment: (NOTE)     The eGFR has been calculated using the CKD EPI equation.     This calculation has not been validated in all clinical situations.     eGFR's persistently <90 mL/min signify possible Chronic Kidney     Disease.  LIPID PANEL     Status: Abnormal   Collection Time    12/19/13  2:20 AM      Result Value Ref Range   Cholesterol 128  0 - 200 mg/dL   Triglycerides 152 (*) <150 mg/dL   HDL 31 (*) >39 mg/dL   Total CHOL/HDL Ratio 4.1     VLDL 30  0 - 40 mg/dL   LDL Cholesterol 67  0 - 99 mg/dL   Comment:            Total Cholesterol/HDL:CHD Risk     Coronary Heart Disease Risk Table                         Men   Women      1/2 Average Risk   3.4   3.3      Average Risk       5.0   4.4      2 X Average Risk   9.6   7.1      3 X Average Risk  23.4   11.0                Use the calculated Patient Ratio     above and the CHD Risk Table     to determine the patient's CHD Risk.                ATP III CLASSIFICATION (LDL):      <100     mg/dL   Optimal      100-129  mg/dL   Near or Above                        Optimal      130-159  mg/dL   Borderline      160-189   mg/dL   High      >190     mg/dL   Very High  TROPONIN I     Status: None   Collection Time    12/19/13  7:22 AM      Result Value Ref Range   Troponin I <0.30  <0.30 ng/mL   Comment:            Due to the release kinetics of cTnI,     a negative result within the first hours     of the onset of symptoms does not rule out     myocardial infarction with certainty.     If myocardial infarction is still suspected,     repeat the test at appropriate intervals.  GLUCOSE, CAPILLARY     Status: Abnormal   Collection Time    12/19/13  8:17 AM      Result Value Ref Range   Glucose-Capillary 161 (*) 70 - 99 mg/dL  AMYLASE     Status: None   Collection Time    12/19/13 11:25 AM      Result Value Ref Range   Amylase 34  0 - 105 U/L  LIPASE, BLOOD     Status: None   Collection Time    12/19/13 11:25 AM      Result Value Ref Range   Lipase 40  11 - 59 U/L  CBC WITH DIFFERENTIAL     Status: Abnormal   Collection Time    12/19/13 11:25 AM      Result Value Ref Range   WBC 14.3 (*) 4.0 - 10.5 K/uL   RBC 5.39  4.22 - 5.81 MIL/uL   Hemoglobin 15.6  13.0 - 17.0 g/dL   HCT 44.9  39.0 - 52.0 %   MCV 83.3  78.0 - 100.0 fL   MCH 28.9  26.0 - 34.0 pg   MCHC 34.7  30.0 - 36.0 g/dL   RDW 14.2  11.5 - 15.5 %   Platelets 235  150 - 400 K/uL   Neutrophils  Relative % 80 (*) 43 - 77 %   Neutro Abs 11.4 (*) 1.7 - 7.7 K/uL   Lymphocytes Relative 13  12 - 46 %   Lymphs Abs 1.8  0.7 - 4.0 K/uL   Monocytes Relative 7  3 - 12 %   Monocytes Absolute 1.0  0.1 - 1.0 K/uL   Eosinophils Relative 0  0 - 5 %   Eosinophils Absolute 0.1  0.0 - 0.7 K/uL   Basophils Relative 0  0 - 1 %   Basophils Absolute 0.0  0.0 - 0.1 K/uL  GLUCOSE, CAPILLARY     Status: Abnormal   Collection Time    12/19/13  1:04 PM      Result Value Ref Range   Glucose-Capillary 141 (*) 70 - 99 mg/dL  GLUCOSE, CAPILLARY     Status: Abnormal   Collection Time    12/19/13  5:04 PM      Result Value Ref Range   Glucose-Capillary 154  (*) 70 - 99 mg/dL   Comment 1 Notify RN    GLUCOSE, CAPILLARY     Status: Abnormal   Collection Time    12/19/13  9:01 PM      Result Value Ref Range   Glucose-Capillary 172 (*) 70 - 99 mg/dL   Comment 1 Notify RN    HEPARIN LEVEL (UNFRACTIONATED)     Status: None   Collection Time    12/20/13  2:13 AM      Result Value Ref Range   Heparin Unfractionated 0.42  0.30 - 0.70 IU/mL   Comment:            IF HEPARIN RESULTS ARE BELOW     EXPECTED VALUES, AND PATIENT     DOSAGE HAS BEEN CONFIRMED,     SUGGEST FOLLOW UP TESTING     OF ANTITHROMBIN III LEVELS.  CBC     Status: None   Collection Time    12/20/13  2:13 AM      Result Value Ref Range   WBC 10.1  4.0 - 10.5 K/uL   RBC 4.90  4.22 - 5.81 MIL/uL   Hemoglobin 14.2  13.0 - 17.0 g/dL   HCT 41.2  39.0 - 52.0 %   MCV 84.1  78.0 - 100.0 fL   MCH 29.0  26.0 - 34.0 pg   MCHC 34.5  30.0 - 36.0 g/dL   RDW 14.3  11.5 - 15.5 %   Platelets 221  150 - 400 K/uL  BASIC METABOLIC PANEL     Status: Abnormal   Collection Time    12/20/13  2:13 AM      Result Value Ref Range   Sodium 137  137 - 147 mEq/L   Potassium 4.0  3.7 - 5.3 mEq/L   Chloride 99  96 - 112 mEq/L   CO2 25  19 - 32 mEq/L   Glucose, Bld 229 (*) 70 - 99 mg/dL   BUN 14  6 - 23 mg/dL   Creatinine, Ser 1.15  0.50 - 1.35 mg/dL   Calcium 9.2  8.4 - 10.5 mg/dL   GFR calc non Af Amer 68 (*) >90 mL/min   GFR calc Af Amer 79 (*) >90 mL/min   Comment: (NOTE)     The eGFR has been calculated using the CKD EPI equation.     This calculation has not been validated in all clinical situations.     eGFR's persistently <90 mL/min signify possible Chronic Kidney  Disease.  PROTIME-INR     Status: None   Collection Time    12/20/13  2:13 AM      Result Value Ref Range   Prothrombin Time 13.5  11.6 - 15.2 seconds   INR 1.05  0.00 - 1.49  GLUCOSE, CAPILLARY     Status: Abnormal   Collection Time    12/20/13  9:50 AM      Result Value Ref Range   Glucose-Capillary 133 (*) 70 -  99 mg/dL  GLUCOSE, CAPILLARY     Status: Abnormal   Collection Time    12/20/13 11:51 AM      Result Value Ref Range   Glucose-Capillary 136 (*) 70 - 99 mg/dL   Comment 1 Documented in Chart     Comment 2 Notify RN    GLUCOSE, CAPILLARY     Status: Abnormal   Collection Time    12/20/13  2:10 PM      Result Value Ref Range   Glucose-Capillary 145 (*) 70 - 99 mg/dL  GLUCOSE, CAPILLARY     Status: Abnormal   Collection Time    12/20/13  4:34 PM      Result Value Ref Range   Glucose-Capillary 158 (*) 70 - 99 mg/dL   Mr Abdomen W Wo Contrast  12/20/2013   CLINICAL DATA:  Possible gallbladder mass on prior ultrasound, right upper quadrant abdominal pain  EXAM: MRI ABDOMEN WITHOUT AND WITH CONTRAST  TECHNIQUE: Multiplanar multisequence MR imaging of the abdomen was performed both before and after the administration of intravenous contrast.  CONTRAST:  71mL MULTIHANCE GADOBENATE DIMEGLUMINE 529 MG/ML IV SOLN  COMPARISON:  Abdominal ultrasound 11/22/2013  FINDINGS: Curvilinear atelectasis is noted at the right lung base.  Dependent hypo intense probable stones or sludge noted within the gallbladder neck image 26. There is mild gallbladder wall thickening and surrounding pericholecystic edema. No intrahepatic ductal dilatation is identified. Common duct and pancreatic duct are normal in caliber.  4.2 cm lateral segment left hepatic lobe cyst incidentally noted image 12 series 3. 1.6 cm mildly T2 hyperintense posterior segment right hepatic lobe mass is identified demonstrating progressive nodular centripetal enhancement compatible with a hemangioma. Hepatic steatosis noted.  Bilateral renal cortical T2 hyperintense cysts are noted. 7 mm left upper pole renal cortical proteinaceous cyst incidentally noted.  In the inferior aspect of the right kidney, there is a complex solid/ cystic mass measuring 4.2 x 3.3 cm with mixed internal signal. Enhancement is noted within a 3 cm central solid component. When  measured including the cystic and solid component, the right lower renal pole mass measures overall 8.8 x 7.8 x 6.7 cm. An adjacent exophytic right lower renal pole dominant cyst with internal debris or possibly peripheral thin linear septation measures 8.3 cm maximally image 24 series 4. No abnormal enhancement is identified in the renal vein or IVC. Although not completely imaged on all sequences, no evidence for adjacent colonic or perinephric invasion. No hydroureteronephrosis. No lymphadenopathy or ascites.  Adrenal glands, spleen, and pancreas appear normal.  IMPRESSION: Probable stone or sludge noted within the gallbladder neck, with mild gallbladder wall thickening and pericholecystic edema, suspicious for cholecystitis. No intrahepatic, common duct, or pancreatic ductal dilatation.  Highly suspicious cystic/solid right lower renal pole exophytic mass with imaging features highly suggestive of renal cell carcinoma or possibly cystic nephroma. Urology consultation and general surgery consultation recommended.  Right hepatic lobe hemangioma incidentally noted. Hepatic steatosis.  These results will be called to the ordering clinician  or representative by the Radiologist Assistant, and communication documented in the PACS or zVision Dashboard.   Electronically Signed   By: Conchita Paris M.D.   On: 12/20/2013 10:42   Review of Systems  Constitutional: Negative.   HENT: Negative.   Eyes: Negative.   Cardiovascular: Positive for chest pain.  Gastrointestinal: Positive for heartburn, nausea and abdominal pain. Negative for vomiting.  Genitourinary: Negative.   Musculoskeletal: Positive for back pain.  Skin: Negative.   Psychiatric/Behavioral: Positive for substance abuse. The patient is nervous/anxious.    Blood pressure 130/71, pulse 67, temperature 98 F (36.7 C), temperature source Oral, resp. rate 16, height $RemoveBe'5\' 9"'[id number]$  (1.753 m), weight 108 kg (238 lb 1.6 oz), SpO2 94.00%. Physical Exam   Constitutional: He is oriented to person, place, and time. He appears well-developed and well-nourished.  HENT:  Head: Normocephalic and atraumatic.  Eyes: Conjunctivae and EOM are normal. Pupils are equal, round, and reactive to light.  Neck: Normal range of motion. Neck supple.  Cardiovascular: Normal rate and regular rhythm.   Respiratory: Effort normal and breath sounds normal.  GI: Soft. Bowel sounds are normal. There is tenderness.  Musculoskeletal: Normal range of motion.  Neurological: He is alert and oriented to person, place, and time.  Skin: Skin is warm.  Psychiatric: He has a normal mood and affect. His behavior is normal. Thought content normal.   Assessment/Plan: 1) RUQ pain with postprandial nausea-due to acute cholecystitis with cholelithiasis/sludge: I am not sure there is much for me to add. Will discuss the case with Dr. Marlou Porch in AM. 2) Chest pain-cath revealed moderate diffuse distal disease; patent DES to LAD.  3) Fatty liver on ultrasound.  4) Type II DM. 5) CAD/HTN/Unstable angina.  6) Hyperlipidemia.  7) Polysubstance abuse. 8) Mass on right kidney: urology consult requested. Juanita Craver 12/20/2013, 8:02 PM

## 2013-12-20 NOTE — Consult Note (Signed)
I have seen and examined the patient and agree with the assessment and plans. Although ideally, I would like to perform the lap chole off plavix, I would still proceed with surgery on plavix and get his gallbladder out rather than having a perc drain if he can handle general anesthesia from a cardiopulmonary standpoint  Mylani Gentry A. Ninfa Linden  MD, FACS

## 2013-12-21 ENCOUNTER — Ambulatory Visit (HOSPITAL_COMMUNITY): Payer: Medicaid Other

## 2013-12-21 DIAGNOSIS — N2889 Other specified disorders of kidney and ureter: Secondary | ICD-10-CM

## 2013-12-21 LAB — GLUCOSE, CAPILLARY
Glucose-Capillary: 166 mg/dL — ABNORMAL HIGH (ref 70–99)
Glucose-Capillary: 235 mg/dL — ABNORMAL HIGH (ref 70–99)
Glucose-Capillary: 162 mg/dL — ABNORMAL HIGH (ref 70–99)
Glucose-Capillary: 119 mg/dL — ABNORMAL HIGH (ref 70–99)

## 2013-12-21 LAB — SURGICAL PCR SCREEN
MRSA, PCR: NEGATIVE
Staphylococcus aureus: NEGATIVE

## 2013-12-21 MED ORDER — OXYCODONE-ACETAMINOPHEN 5-325 MG PO TABS
1.0000 | ORAL_TABLET | ORAL | Status: DC | PRN
  Administered 2013-12-21 – 2013-12-22 (×3): 1 via ORAL
  Filled 2013-12-21 (×3): qty 1

## 2013-12-21 NOTE — Progress Notes (Signed)
1 Day Post-Op  Subjective: Comfortable Tolerated cath  Objective: Vital signs in last 24 hours: Temp:  [97.7 F (36.5 C)-98.4 F (36.9 C)] 98 F (36.7 C) (06/04 0550) Pulse Rate:  [50-75] 75 (06/04 0550) Resp:  [16-31] 18 (06/04 0550) BP: (101-130)/(43-78) 128/69 mmHg (06/04 0550) SpO2:  [86 %-96 %] 94 % (06/04 0550) Weight:  [238 lb 8.6 oz (108.2 kg)] 238 lb 8.6 oz (108.2 kg) (06/04 0550) Last BM Date: 12/18/13  Intake/Output from previous day: 06/03 0701 - 06/04 0700 In: 513 [I.V.:513] Out: 1400 [Urine:1400] Intake/Output this shift:    Abdomen soft, tender in the RUQ  Lab Results:   Recent Labs  12/19/13 1125 12/20/13 0213  WBC 14.3* 10.1  HGB 15.6 14.2  HCT 44.9 41.2  PLT 235 221   BMET  Recent Labs  12/19/13 0220 12/20/13 0213  NA 133* 137  K 4.3 4.0  CL 96 99  CO2 24 25  GLUCOSE 177* 229*  BUN 9 14  CREATININE 0.83 1.15  CALCIUM 9.5 9.2   PT/INR  Recent Labs  12/20/13 0213  LABPROT 13.5  INR 1.05   ABG No results found for this basename: PHART, PCO2, PO2, HCO3,  in the last 72 hours  Studies/Results: Mr Abdomen W Wo Contrast  12/20/2013   CLINICAL DATA:  Possible gallbladder mass on prior ultrasound, right upper quadrant abdominal pain  EXAM: MRI ABDOMEN WITHOUT AND WITH CONTRAST  TECHNIQUE: Multiplanar multisequence MR imaging of the abdomen was performed both before and after the administration of intravenous contrast.  CONTRAST:  2mL MULTIHANCE GADOBENATE DIMEGLUMINE 529 MG/ML IV SOLN  COMPARISON:  Abdominal ultrasound 11/22/2013  FINDINGS: Curvilinear atelectasis is noted at the right lung base.  Dependent hypo intense probable stones or sludge noted within the gallbladder neck image 26. There is mild gallbladder wall thickening and surrounding pericholecystic edema. No intrahepatic ductal dilatation is identified. Common duct and pancreatic duct are normal in caliber.  4.2 cm lateral segment left hepatic lobe cyst incidentally noted image  12 series 3. 1.6 cm mildly T2 hyperintense posterior segment right hepatic lobe mass is identified demonstrating progressive nodular centripetal enhancement compatible with a hemangioma. Hepatic steatosis noted.  Bilateral renal cortical T2 hyperintense cysts are noted. 7 mm left upper pole renal cortical proteinaceous cyst incidentally noted.  In the inferior aspect of the right kidney, there is a complex solid/ cystic mass measuring 4.2 x 3.3 cm with mixed internal signal. Enhancement is noted within a 3 cm central solid component. When measured including the cystic and solid component, the right lower renal pole mass measures overall 8.8 x 7.8 x 6.7 cm. An adjacent exophytic right lower renal pole dominant cyst with internal debris or possibly peripheral thin linear septation measures 8.3 cm maximally image 24 series 4. No abnormal enhancement is identified in the renal vein or IVC. Although not completely imaged on all sequences, no evidence for adjacent colonic or perinephric invasion. No hydroureteronephrosis. No lymphadenopathy or ascites.  Adrenal glands, spleen, and pancreas appear normal.  IMPRESSION: Probable stone or sludge noted within the gallbladder neck, with mild gallbladder wall thickening and pericholecystic edema, suspicious for cholecystitis. No intrahepatic, common duct, or pancreatic ductal dilatation.  Highly suspicious cystic/solid right lower renal pole exophytic mass with imaging features highly suggestive of renal cell carcinoma or possibly cystic nephroma. Urology consultation and general surgery consultation recommended.  Right hepatic lobe hemangioma incidentally noted. Hepatic steatosis.  These results will be called to the ordering clinician or representative by the  Psychologist, clinical, and communication documented in the PACS or zVision Dashboard.   Electronically Signed   By: Conchita Paris M.D.   On: 12/20/2013 10:42    Anti-infectives: Anti-infectives   None       Assessment/Plan: s/p Procedure(s): LEFT HEART CATHETERIZATION WITH CORONARY ANGIOGRAM (N/A)  Acute cholecystitis with cholelithiasis.  Will plan on proceeding with a lap chole and possible cholangiogram tomorrow.  Will also resume his plavix tomorrow post op. Discussed with nephrology.  They will hold on any intervention regarding the kidney given the acute nature of his gallbladder, risks of infection, etc. I discussed the risks of surgery.  These include but are not limited to bleeding, infection, injury to surrounding structures, bile leak, bile duct injury, need for conversion to an open procedure, cardiopulmonary problems, etc.  He agrees to proceed.  LOS: 3 days    [name] 12/21/2013

## 2013-12-21 NOTE — Progress Notes (Signed)
Patient Profile: 58 year old male with a prior history of coronary artery disease, status post DES placement to the LAD in August of 2014, who was admitted 12/18/13 for chest pain.   Also significant is a recent h/o of RUQ abdominal pain. Abdominal ultrasound demonstrated a 9 cm right upper quadrant complex cystic lesion in the area of the gallbladder fossa along with a 1.8 cm indeterminate hypoechoic lesion in the left hepatic lobe. Subsequent abdominal MRI revealed a highly suspicious cystic/solid right lower renal pole exophytic mass with imaging features highly suggestive of renal cell carcinoma or possibly cystic nephroma. This also revealed a stone in the gallbladder neck with wall thickening and pericholecystic fluid   Subjective: Denies any further chest discomfort. No right wrist pain or swelling. Abdominal pain improved.  Objective: Vital signs in last 24 hours: Temp:  [97.7 F (36.5 C)-98.4 F (36.9 C)] 98 F (36.7 C) (06/04 0550) Pulse Rate:  [50-75] 75 (06/04 0550) Resp:  [16-31] 18 (06/04 0550) BP: (101-130)/(43-78) 128/69 mmHg (06/04 0550) SpO2:  [86 %-96 %] 94 % (06/04 0550) Weight:  [238 lb 8.6 oz (108.2 kg)] 238 lb 8.6 oz (108.2 kg) (06/04 0550) Last BM Date: 12/18/13  Intake/Output from previous day: 06/03 0701 - 06/04 0700 In: 513 [I.V.:513] Out: 1400 [Urine:1400] Intake/Output this shift:    Medications Current Facility-Administered Medications  Medication Dose Route Frequency Provider Last Rate Last Dose  . 0.9 %  sodium chloride infusion  250 mL Intravenous PRN Rogelia Mire, NP      . antiseptic oral rinse (BIOTENE) solution 15 mL  15 mL Mouth Rinse BID Orpah Greek, MD   15 mL at 12/20/13 2125  . aspirin chewable tablet 81 mg  81 mg Oral Daily Rogelia Mire, NP   81 mg at 12/20/13 0957  . atorvastatin (LIPITOR) tablet 40 mg  40 mg Oral q1800 Jeronimo Norma, RPH   40 mg at 12/20/13 1744  . diltiazem (CARDIZEM CD) 24 hr capsule 240 mg   240 mg Oral Daily Rogelia Mire, NP   240 mg at 12/20/13 1000  . gabapentin (NEURONTIN) capsule 300 mg  300 mg Oral QHS Rogelia Mire, NP   300 mg at 12/20/13 2123  . glipiZIDE (GLUCOTROL) tablet 2.5 mg  2.5 mg Oral BID AC Rogelia Mire, NP   2.5 mg at 12/20/13 1743  . insulin aspart (novoLOG) injection 0-15 Units  0-15 Units Subcutaneous TID WC Rogelia Mire, NP   3 Units at 12/20/13 1744  . isosorbide mononitrate (IMDUR) 24 hr tablet 30 mg  30 mg Oral Daily Cecilie Kicks, NP   30 mg at 12/20/13 1231  . linagliptin (TRADJENTA) tablet 5 mg  5 mg Oral Daily Rogelia Mire, NP      . lisinopril (PRINIVIL,ZESTRIL) tablet 10 mg  10 mg Oral Daily Rogelia Mire, NP   10 mg at 12/20/13 0957  . nitroGLYCERIN (NITROSTAT) SL tablet 0.4 mg  0.4 mg Sublingual Q5 Min x 3 PRN Rogelia Mire, NP      . omega-3 acid ethyl esters (LOVAZA) capsule 1 g  1 g Oral q12n4p Jeronimo Norma, RPH   1 g at 12/20/13 1743  . pantoprazole (PROTONIX) EC tablet 40 mg  40 mg Oral Q0600 Rogelia Mire, NP   40 mg at 12/21/13 0449  . sodium chloride 0.9 % injection 3 mL  3 mL Intravenous Q12H Rogelia Mire, NP   3 mL at 12/20/13 2125  .  sodium chloride 0.9 % injection 3 mL  3 mL Intravenous PRN Rogelia Mire, NP        PE: General appearance: alert, cooperative, no distress and moderately obese Lungs: clear to auscultation bilaterally Heart: regular rate and rhythm and 1/6 SM Abdomen: mild RUQ tenderness Extremities: no LEE Pulses: 2+ and symmetric Skin: warm and dry Neurologic: Grossly normal Obese Lab Results:   Recent Labs  12/19/13 0220 12/19/13 1125 December 23, 2013 0213  WBC 14.8* 14.3* 10.1  HGB 15.3 15.6 14.2  HCT 43.9 44.9 41.2  PLT 243 235 221   BMET  Recent Labs  12/18/13 1203 12/19/13 0220 December 23, 2013 0213  NA 137 133* 137  K 4.2 4.3 4.0  CL 99 96 99  CO2 23 24 25   GLUCOSE 190* 177* 229*  BUN 13 9 14   CREATININE 0.98 0.83 1.15  CALCIUM 9.4 9.5 9.2     PT/INR  Recent Labs  2013/12/23 0213  LABPROT 13.5  INR 1.05   Cholesterol  Recent Labs  12/19/13 0220  CHOL 128   Cardiac Panel (last 3 results)  Recent Labs  12/18/13 1950 12/19/13 0220 12/19/13 0722  TROPONINI <0.30 <0.30 <0.30    Studies/Results:  Diagnostic LHC 12/23/13 Impression:  1. Double vessel CAD with patent stent mid LAD  2. Moderate non-obstructive disease in the Circumflex and RCA  3. Normal LV systolic function. EF 65%   Assessment/Plan  Active Problems:   Tobacco abuse   Cocaine abuse   CAD (coronary artery disease), native coronary artery   Type 2 diabetes mellitus   Hyperlipidemia   Midsternal chest pain   Cholecystitis, acute   Renal mass, right  1. CAD: LHC yesterday revealed double vessel CAD with patent stent in mid LAD. There is moderate non-obstructive disease in the circumflex and RCA. LVEF normal with EF or 65%. It is likely that his exertional chest pain is related to small vessel disease that is worsened with cocaine use and tobacco use. Complete cessation of cocaine and tobacco recommended. Continue medical management of CAD: ASA, statin, ACE, + Imdur. No BB given cocaine use. Continue to hold Plavix in anticipation of scheduled surgery.   2. Cholecystitis: General surgery following. Surgery tentatively planned for 12/22/13.   3. Right Renal Mass: Urology recommended general surgery consult. ? Plan. Will likely proceed in outpatient setting with further evaluation once cholecystitis/ cholecystectomy resolved.   4. Tobacco Use: complete smoking cessation advised  5. Cocaine Use: Complete cessation advised. Avoid BBs.   6. HLD: LDL is at goal of < 70 at 67. Continue statin.   7: DM: continue diabetic meds    LOS: 3 days    Brittainy M. Ladoris Gene 12/21/2013 8:50 AM Personally seen and examined. Agree with above. Note changes made.  May proceed with surgery tomorrow, cath reassuring. DES to LAD wide open. Post op, resume  Plavix to complete full one year course in 8/15. Discussed with Dr. Ninfa Linden.  Candee Furbish, MD

## 2013-12-22 ENCOUNTER — Encounter (HOSPITAL_COMMUNITY): Payer: Self-pay | Admitting: Certified Registered Nurse Anesthetist

## 2013-12-22 ENCOUNTER — Encounter (HOSPITAL_COMMUNITY)
Admission: EM | Disposition: A | Discharge status: Home / Self Care | Payer: Self-pay | Source: Home / Self Care | Attending: Cardiovascular Disease

## 2013-12-22 ENCOUNTER — Inpatient Hospital Stay (HOSPITAL_COMMUNITY): Payer: Medicaid Other | Admitting: Anesthesiology

## 2013-12-22 ENCOUNTER — Encounter (HOSPITAL_COMMUNITY): Payer: Medicaid Other | Admitting: Anesthesiology

## 2013-12-22 DIAGNOSIS — J449 Chronic obstructive pulmonary disease, unspecified: Secondary | ICD-10-CM | POA: Diagnosis not present

## 2013-12-22 DIAGNOSIS — K7689 Other specified diseases of liver: Secondary | ICD-10-CM | POA: Diagnosis not present

## 2013-12-22 DIAGNOSIS — K8010 Calculus of gallbladder with chronic cholecystitis without obstruction: Secondary | ICD-10-CM

## 2013-12-22 DIAGNOSIS — E119 Type 2 diabetes mellitus without complications: Secondary | ICD-10-CM | POA: Diagnosis not present

## 2013-12-22 DIAGNOSIS — K8000 Calculus of gallbladder with acute cholecystitis without obstruction: Secondary | ICD-10-CM | POA: Diagnosis not present

## 2013-12-22 HISTORY — PX: CHOLECYSTECTOMY: SHX55

## 2013-12-22 LAB — GLUCOSE, CAPILLARY
GLUCOSE-CAPILLARY: 143 mg/dL — AB (ref 70–99)
GLUCOSE-CAPILLARY: 150 mg/dL — AB (ref 70–99)
GLUCOSE-CAPILLARY: 180 mg/dL — AB (ref 70–99)
GLUCOSE-CAPILLARY: 152 mg/dL — AB (ref 70–99)
Glucose-Capillary: 159 mg/dL — ABNORMAL HIGH (ref 70–99)

## 2013-12-22 SURGERY — LAPAROSCOPIC CHOLECYSTECTOMY WITH INTRAOPERATIVE CHOLANGIOGRAM
Anesthesia: General | Site: Abdomen

## 2013-12-22 MED ORDER — ONDANSETRON HCL 4 MG/2ML IJ SOLN
4.0000 mg | Freq: Once | INTRAMUSCULAR | Status: AC | PRN
  Administered 2013-12-22: 4 mg via INTRAVENOUS

## 2013-12-22 MED ORDER — CLOPIDOGREL BISULFATE 75 MG PO TABS
75.0000 mg | ORAL_TABLET | Freq: Every day | ORAL | Status: DC
  Administered 2013-12-23: 75 mg via ORAL
  Filled 2013-12-22: qty 1

## 2013-12-22 MED ORDER — HYDROMORPHONE HCL PF 1 MG/ML IJ SOLN: 0.2500 mg | INTRAMUSCULAR | Status: DC | PRN

## 2013-12-22 MED ORDER — BUPIVACAINE-EPINEPHRINE (PF) 0.25% -1:200000 IJ SOLN
INTRAMUSCULAR | Status: AC
  Filled 2013-12-22: qty 30

## 2013-12-22 MED ORDER — SUCCINYLCHOLINE CHLORIDE 20 MG/ML IJ SOLN
INTRAMUSCULAR | Status: DC | PRN
  Administered 2013-12-22: 100 mg via INTRAVENOUS

## 2013-12-22 MED ORDER — LACTATED RINGERS IV SOLN
INTRAVENOUS | Status: DC | PRN
  Administered 2013-12-22 (×2): via INTRAVENOUS

## 2013-12-22 MED ORDER — OXYCODONE HCL 5 MG PO TABS: 5.0000 mg | ORAL_TABLET | Freq: Once | ORAL | Status: DC | PRN

## 2013-12-22 MED ORDER — MIDAZOLAM HCL 2 MG/2ML IJ SOLN
INTRAMUSCULAR | Status: AC
  Filled 2013-12-22: qty 2

## 2013-12-22 MED ORDER — HYDROCODONE-ACETAMINOPHEN 5-325 MG PO TABS
1.0000 | ORAL_TABLET | ORAL | Status: DC | PRN
  Administered 2013-12-22: 2 via ORAL
  Filled 2013-12-22: qty 2

## 2013-12-22 MED ORDER — SODIUM CHLORIDE 0.9 % IR SOLN
Status: DC | PRN
  Administered 2013-12-22: 1000 mL

## 2013-12-22 MED ORDER — BUPIVACAINE-EPINEPHRINE 0.25% -1:200000 IJ SOLN
INTRAMUSCULAR | Status: DC | PRN
  Administered 2013-12-22: 20 mL

## 2013-12-22 MED ORDER — PROPOFOL 10 MG/ML IV BOLUS
INTRAVENOUS | Status: AC
  Filled 2013-12-22: qty 20

## 2013-12-22 MED ORDER — CEFAZOLIN SODIUM-DEXTROSE 2-3 GM-% IV SOLR
INTRAVENOUS | Status: DC | PRN
  Administered 2013-12-22: 2 g via INTRAVENOUS

## 2013-12-22 MED ORDER — CEFAZOLIN SODIUM-DEXTROSE 2-3 GM-% IV SOLR
INTRAVENOUS | Status: AC
  Filled 2013-12-22: qty 50

## 2013-12-22 MED ORDER — FENTANYL CITRATE 0.05 MG/ML IJ SOLN
INTRAMUSCULAR | Status: AC
  Filled 2013-12-22: qty 5

## 2013-12-22 MED ORDER — ONDANSETRON HCL 4 MG/2ML IJ SOLN: 4.0000 mg | Freq: Once | INTRAMUSCULAR | Status: DC | PRN

## 2013-12-22 MED ORDER — 0.9 % SODIUM CHLORIDE (POUR BTL) OPTIME
TOPICAL | Status: DC | PRN
  Administered 2013-12-22: 1000 mL

## 2013-12-22 MED ORDER — LIDOCAINE HCL (CARDIAC) 20 MG/ML IV SOLN
INTRAVENOUS | Status: AC
  Filled 2013-12-22: qty 5

## 2013-12-22 MED ORDER — ONDANSETRON HCL 4 MG/2ML IJ SOLN
INTRAMUSCULAR | Status: AC
  Filled 2013-12-22: qty 2

## 2013-12-22 MED ORDER — KETOROLAC TROMETHAMINE 30 MG/ML IJ SOLN: 15.0000 mg | Freq: Once | INTRAMUSCULAR | Status: DC | PRN

## 2013-12-22 MED ORDER — PHENYLEPHRINE HCL 10 MG/ML IJ SOLN
INTRAMUSCULAR | Status: DC | PRN
  Administered 2013-12-22: 40 ug via INTRAVENOUS

## 2013-12-22 MED ORDER — MIDAZOLAM HCL 5 MG/5ML IJ SOLN
INTRAMUSCULAR | Status: DC | PRN
  Administered 2013-12-22: 2 mg via INTRAVENOUS

## 2013-12-22 MED ORDER — PHENYLEPHRINE 40 MCG/ML (10ML) SYRINGE FOR IV PUSH (FOR BLOOD PRESSURE SUPPORT)
PREFILLED_SYRINGE | INTRAVENOUS | Status: AC
  Filled 2013-12-22: qty 10

## 2013-12-22 MED ORDER — LACTATED RINGERS IV SOLN
INTRAVENOUS | Status: DC
  Administered 2013-12-22: 09:00:00 via INTRAVENOUS

## 2013-12-22 MED ORDER — OXYCODONE HCL 5 MG/5ML PO SOLN: 5.0000 mg | Freq: Once | ORAL | Status: DC | PRN

## 2013-12-22 MED ORDER — FENTANYL CITRATE 0.05 MG/ML IJ SOLN
INTRAMUSCULAR | Status: DC | PRN
  Administered 2013-12-22: 100 ug via INTRAVENOUS
  Administered 2013-12-22: 50 ug via INTRAVENOUS
  Administered 2013-12-22: 150 ug via INTRAVENOUS
  Administered 2013-12-22: 50 ug via INTRAVENOUS

## 2013-12-22 MED ORDER — MORPHINE SULFATE 2 MG/ML IJ SOLN
1.0000 mg | INTRAMUSCULAR | Status: DC | PRN
  Administered 2013-12-22: 2 mg via INTRAVENOUS
  Filled 2013-12-22: qty 1

## 2013-12-22 MED ORDER — LIDOCAINE HCL (CARDIAC) 20 MG/ML IV SOLN
INTRAVENOUS | Status: DC | PRN
  Administered 2013-12-22: 40 mg via INTRAVENOUS

## 2013-12-22 MED ORDER — ONDANSETRON HCL 4 MG/2ML IJ SOLN
INTRAMUSCULAR | Status: DC | PRN
  Administered 2013-12-22: 4 mg via INTRAVENOUS

## 2013-12-22 MED ORDER — PROPOFOL 10 MG/ML IV BOLUS
INTRAVENOUS | Status: DC | PRN
  Administered 2013-12-22: 40 mg via INTRAVENOUS
  Administered 2013-12-22: 30 mg via INTRAVENOUS
  Administered 2013-12-22: 40 mg via INTRAVENOUS
  Administered 2013-12-22 (×2): 30 mg via INTRAVENOUS
  Administered 2013-12-22: 110 mg via INTRAVENOUS

## 2013-12-22 MED ORDER — ROCURONIUM BROMIDE 50 MG/5ML IV SOLN
INTRAVENOUS | Status: AC
  Filled 2013-12-22: qty 1

## 2013-12-22 MED ORDER — HEMOSTATIC AGENTS (NO CHARGE) OPTIME
TOPICAL | Status: DC | PRN
  Administered 2013-12-22: 1 via TOPICAL

## 2013-12-22 SURGICAL SUPPLY — 46 items
APPLIER CLIP 5 13 M/L LIGAMAX5 (MISCELLANEOUS) ×2
BANDAGE ADHESIVE 1X3 (GAUZE/BANDAGES/DRESSINGS) ×8 IMPLANT
BENZOIN TINCTURE PRP APPL 2/3 (GAUZE/BANDAGES/DRESSINGS) ×2 IMPLANT
CANISTER SUCTION 2500CC (MISCELLANEOUS) ×2 IMPLANT
CHLORAPREP W/TINT 26ML (MISCELLANEOUS) ×2 IMPLANT
CLIP APPLIE 5 13 M/L LIGAMAX5 (MISCELLANEOUS) ×1 IMPLANT
COVER MAYO STAND STRL (DRAPES) IMPLANT
COVER SURGICAL LIGHT HANDLE (MISCELLANEOUS) ×2 IMPLANT
DECANTER SPIKE VIAL GLASS SM (MISCELLANEOUS) IMPLANT
DRAPE C-ARM 42X72 X-RAY (DRAPES) IMPLANT
DRAPE UTILITY 15X26 W/TAPE STR (DRAPE) ×4 IMPLANT
ELECT REM PT RETURN 9FT ADLT (ELECTROSURGICAL) ×2
ELECTRODE REM PT RTRN 9FT ADLT (ELECTROSURGICAL) ×1 IMPLANT
FLOSHIELD STORZ OLYMP 10MM 30 (MISCELLANEOUS) ×2 IMPLANT
GLOVE BIO SURGEON STRL SZ7.5 (GLOVE) ×4 IMPLANT
GLOVE BIOGEL PI IND STRL 6.5 (GLOVE) ×1 IMPLANT
GLOVE BIOGEL PI IND STRL 7.0 (GLOVE) ×1 IMPLANT
GLOVE BIOGEL PI IND STRL 7.5 (GLOVE) ×1 IMPLANT
GLOVE BIOGEL PI INDICATOR 6.5 (GLOVE) ×1
GLOVE BIOGEL PI INDICATOR 7.0 (GLOVE) ×1
GLOVE BIOGEL PI INDICATOR 7.5 (GLOVE) ×1
GLOVE SS BIOGEL STRL SZ 7 (GLOVE) ×1 IMPLANT
GLOVE SUPERSENSE BIOGEL SZ 7 (GLOVE) ×1
GLOVE SURG SIGNA 7.5 PF LTX (GLOVE) ×2 IMPLANT
GOWN STRL REUS W/ TWL LRG LVL3 (GOWN DISPOSABLE) ×3 IMPLANT
GOWN STRL REUS W/ TWL XL LVL3 (GOWN DISPOSABLE) ×1 IMPLANT
GOWN STRL REUS W/TWL LRG LVL3 (GOWN DISPOSABLE) ×3
GOWN STRL REUS W/TWL XL LVL3 (GOWN DISPOSABLE) ×1
HEMOSTAT SNOW SURGICEL 2X4 (HEMOSTASIS) ×2 IMPLANT
KIT BASIN OR (CUSTOM PROCEDURE TRAY) ×2 IMPLANT
KIT ROOM TURNOVER OR (KITS) ×2 IMPLANT
NS IRRIG 1000ML POUR BTL (IV SOLUTION) ×2 IMPLANT
PAD ARMBOARD 7.5X6 YLW CONV (MISCELLANEOUS) ×2 IMPLANT
POUCH SPECIMEN RETRIEVAL 10MM (ENDOMECHANICALS) ×2 IMPLANT
SCISSORS LAP 5X35 DISP (ENDOMECHANICALS) ×2 IMPLANT
SET CHOLANGIOGRAPH 5 50 .035 (SET/KITS/TRAYS/PACK) IMPLANT
SET IRRIG TUBING LAPAROSCOPIC (IRRIGATION / IRRIGATOR) ×2 IMPLANT
SLEEVE ENDOPATH XCEL 5M (ENDOMECHANICALS) ×4 IMPLANT
SPECIMEN JAR SMALL (MISCELLANEOUS) ×2 IMPLANT
SUT MON AB 4-0 PC3 18 (SUTURE) ×2 IMPLANT
TOWEL OR 17X24 6PK STRL BLUE (TOWEL DISPOSABLE) ×2 IMPLANT
TOWEL OR 17X26 10 PK STRL BLUE (TOWEL DISPOSABLE) ×2 IMPLANT
TOWEL OR NON WOVEN STRL DISP B (DISPOSABLE) ×2 IMPLANT
TRAY LAPAROSCOPIC (CUSTOM PROCEDURE TRAY) ×2 IMPLANT
TROCAR XCEL BLUNT TIP 100MML (ENDOMECHANICALS) ×2 IMPLANT
TROCAR XCEL NON-BLD 5MMX100MML (ENDOMECHANICALS) ×2 IMPLANT

## 2013-12-22 NOTE — Anesthesia Preprocedure Evaluation (Addendum)
Anesthesia Evaluation  Patient identified by MRN, date of birth, ID band Patient awake    Reviewed: Allergy & Precautions, H&P , NPO status , Patient's Chart, lab work & pertinent test results  History of Anesthesia Complications Negative for: history of anesthetic complications  Airway Mallampati: II TM Distance: >3 FB Neck ROM: Full    Dental  (+) Dental Advisory Given, Edentulous Upper, Edentulous Lower   Pulmonary sleep apnea (does not use CPAP) , COPD COPD inhaler, Current Smoker,  breath sounds clear to auscultation        Cardiovascular hypertension, Pt. on medications + CAD and + Cardiac Stents Rhythm:Regular Rate:Normal  '13 ECHO: EF 65-70%, valves OK '14 cath: DES LAD, otherwise non-obstructive dz, EF 55%   Neuro/Psych Anxiety TIA   GI/Hepatic GERD-  Medicated and Poorly Controlled,(+)     substance abuse  cocaine use, N/V with acute chole   Endo/Other  diabetes, Type 2, Oral Hypoglycemic AgentsMorbid obesity  Renal/GU      Musculoskeletal  (+) Arthritis -,   Abdominal (+) + obese,   Peds  Hematology   Anesthesia Other Findings   Reproductive/Obstetrics                        Anesthesia Physical Anesthesia Plan  ASA: III  Anesthesia Plan: General   Post-op Pain Management:    Induction: Intravenous  Airway Management Planned: Oral ETT  Additional Equipment:   Intra-op Plan:   Post-operative Plan: Extubation in OR  Informed Consent: I have reviewed the patients History and Physical, chart, labs and discussed the procedure including the risks, benefits and alternatives for the proposed anesthesia with the patient or authorized representative who has indicated his/her understanding and acceptance.   Dental advisory given  Plan Discussed with: CRNA, Anesthesiologist and Surgeon  Anesthesia Plan Comments: (Plan routine monitors, GETA)      Anesthesia Quick  Evaluation

## 2013-12-22 NOTE — Anesthesia Postprocedure Evaluation (Signed)
  Anesthesia Post-op Note  Patient: [name]  Procedure(s) Performed: Procedure(s): LAPAROSCOPIC CHOLECYSTECTOMY  (N/A)  Patient Location: PACU  Anesthesia Type:General  Level of Consciousness: awake, alert , oriented and patient cooperative  Airway and Oxygen Therapy: Patient Spontanous Breathing and Patient connected to nasal cannula oxygen  Post-op Pain: none  Post-op Assessment: Post-op Vital signs reviewed, Patient's Cardiovascular Status Stable, Respiratory Function Stable, Patent Airway, No signs of Nausea or vomiting and Pain level controlled  Post-op Vital Signs: Reviewed and stable  Last Vitals:  Filed Vitals:   12/22/13 1045  BP:   Pulse: 88  Temp:   Resp: 16    Complications: No apparent anesthesia complications

## 2013-12-22 NOTE — Anesthesia Procedure Notes (Signed)
Procedure Name: Intubation Date/Time: 12/22/2013 9:20 AM Performed by: Blair Heys E Pre-anesthesia Checklist: Patient identified, Emergency Drugs available, Suction available and Patient being monitored Patient Re-evaluated:Patient Re-evaluated prior to inductionOxygen Delivery Method: Circle system utilized Preoxygenation: Pre-oxygenation with 100% oxygen Intubation Type: IV induction and Rapid sequence Laryngoscope Size: Miller and 2 Grade View: Grade I Tube type: Oral Tube size: 7.5 mm Number of attempts: 1 Airway Equipment and Method: Stylet Placement Confirmation: ETT inserted through vocal cords under direct vision,  positive ETCO2 and breath sounds checked- equal and bilateral Secured at: 23 cm Tube secured with: Tape

## 2013-12-22 NOTE — Transfer of Care (Signed)
Immediate Anesthesia Transfer of Care Note  Patient: [name]  Procedure(s) Performed: Procedure(s): LAPAROSCOPIC CHOLECYSTECTOMY  (N/A)  Patient Location: PACU  Anesthesia Type:General  Level of Consciousness: awake and alert   Airway & Oxygen Therapy: Patient Spontanous Breathing and Patient connected to face mask oxygen  Post-op Assessment: Report given to PACU RN, Post -op Vital signs reviewed and stable and Patient moving all extremities X 4  Post vital signs: Reviewed and stable  Complications: No apparent anesthesia complications

## 2013-12-22 NOTE — Op Note (Signed)
Laparoscopic Cholecystectomy Procedure Note  Indications: This patient presents with symptomatic gallbladder disease and will undergo laparoscopic cholecystectomy.  Pre-operative Diagnosis: Calculus of gallbladder with acute cholecystitis, without mention of obstruction  Post-operative Diagnosis: Same  Surgeon: [name]   Assistants: 0  Anesthesia: General endotracheal anesthesia  ASA Class: 3  Procedure Details  The patient was seen again in the Holding Room. The risks, benefits, complications, treatment options, and expected outcomes were discussed with the patient. The possibilities of reaction to medication, pulmonary aspiration, perforation of viscus, bleeding, recurrent infection, finding a normal gallbladder, the need for additional procedures, failure to diagnose a condition, the possible need to convert to an open procedure, and creating a complication requiring transfusion or operation were discussed with the patient. The likelihood of improving the patient's symptoms with return to their baseline status is good.  The patient and/or family concurred with the proposed plan, giving informed consent. The site of surgery properly noted. The patient was taken to Operating Room, identified as [name] and the procedure verified as Laparoscopic Cholecystectomy with Intraoperative Cholangiogram. A Time Out was held and the above information confirmed.  Prior to the induction of general anesthesia, antibiotic prophylaxis was administered. General endotracheal anesthesia was then administered and tolerated well. After the induction, the abdomen was prepped with Chloraprep and draped in sterile fashion. The patient was positioned in the supine position.  Local anesthetic agent was injected into the skin near the umbilicus and an incision made. We dissected down to the abdominal fascia with blunt dissection.  The fascia was incised vertically and we entered the peritoneal cavity  bluntly.  A pursestring suture of 0-Vicryl was placed around the fascial opening.  The Hasson cannula was inserted and secured with the stay suture.  Pneumoperitoneum was then created with CO2 and tolerated well without any adverse changes in the patient's vital signs. An 11-mm port was placed in the subxiphoid position.  Two 5-mm ports were placed in the right upper quadrant. All skin incisions were infiltrated with a local anesthetic agent before making the incision and placing the trocars.   We positioned the patient in reverse Trendelenburg, tilted slightly to the patient's left.  The gallbladder was identified, the fundus grasped and retracted cephalad. Adhesions were lysed bluntly and with the electrocautery where indicated, taking care not to injure any adjacent organs or viscus. The infundibulum was grasped and retracted laterally, exposing the peritoneum overlying the triangle of Calot. This was then divided and exposed in a blunt fashion. The cystic duct was clearly identified and bluntly dissected circumferentially. A critical view of the cystic duct and cystic artery was obtained.  The cystic duct was then ligated with clips and divided. The cystic artery was, dissected free, ligated with clips and divided as well.   The gallbladder was dissected from the liver bed in retrograde fashion with the electrocautery. The gallbladder was removed and placed in an Endocatch sac. The liver bed was irrigated and inspected. Hemostasis was achieved with the electrocautery. Copious irrigation was utilized and was repeatedly aspirated until clear.  The gallbladder and Endocatch sac were then removed through the umbilical port site.  The pursestring suture was used to close the umbilical fascia.    We again inspected the right upper quadrant for hemostasis.  He was hemostatic in the liver bed but I still placed a piece of Snow in the gallbladder fossa as he will be resuming plavix. Pneumoperitoneum was released as  we removed the trocars.  4-0  Monocryl was used to close the skin.   Benzoin, steri-strips, and clean dressings were applied. The patient was then extubated and brought to the recovery room in stable condition. Instrument, sponge, and needle counts were correct at closure and at the conclusion of the case.   Findings: Cholecystitis with Cholelithiasis  Estimated Blood Loss: Minimal         Drains: 0         Specimens: Gallbladder           Complications: None; patient tolerated the procedure well.         Disposition: PACU - hemodynamically stable.         Condition: stable

## 2013-12-22 NOTE — Progress Notes (Signed)
Currently in surgery.

## 2013-12-23 DIAGNOSIS — E785 Hyperlipidemia, unspecified: Secondary | ICD-10-CM

## 2013-12-23 DIAGNOSIS — E119 Type 2 diabetes mellitus without complications: Secondary | ICD-10-CM

## 2013-12-23 DIAGNOSIS — F17200 Nicotine dependence, unspecified, uncomplicated: Secondary | ICD-10-CM

## 2013-12-23 DIAGNOSIS — K810 Acute cholecystitis: Secondary | ICD-10-CM

## 2013-12-23 LAB — GLUCOSE, CAPILLARY
GLUCOSE-CAPILLARY: 184 mg/dL — AB (ref 70–99)
Glucose-Capillary: 204 mg/dL — ABNORMAL HIGH (ref 70–99)

## 2013-12-23 MED ORDER — LISINOPRIL 10 MG PO TABS: 10.0000 mg | ORAL_TABLET | Freq: Every day | ORAL | Status: DC

## 2013-12-23 NOTE — Progress Notes (Signed)
EKG on 12/23/2013 at 0507 is not this pt's information. Will follow up with medical records when available.

## 2013-12-23 NOTE — Discharge Summary (Signed)
Discharge Summary   Patient ID: [name],  MRN: [id number], DOB/AGE: Aug 21, 1955 58 y.o.  Admit date: 12/18/2013 Discharge date: 12/23/2013  Primary Care Provider: Jacqualine Mau Primary Cardiologist: Myles Gip, MD   Discharge Diagnoses Principal Problem:   Midsternal chest pain  **S/P Catheterization this admission revealing non-obstructive CAD with normal LV function.  Active Problems:   Cocaine abuse  **Positive UDS this admission.   CAD (coronary artery disease), native coronary artery   Cholecystitis, acute  **S/P laparascopic cholecystectomy this admission.   Renal mass, right  **MRI this admission showed highly suspicious cystic/solid right lower renal pole exophytic mass suggestive of renal cell carcinoma or cystic nephroma.  Seen by urology.  Pending outpatient general surgery follow-up.   Essential hypertension, benign   Tobacco abuse   Type 2 diabetes mellitus   Hyperlipidemia  Allergies No Known Allergies  Procedures  MRI of the Abdomen 6.3.2015  IMPRESSION: Probable stone or sludge noted within the gallbladder neck, with mild gallbladder wall thickening and pericholecystic edema, suspicious for cholecystitis. No intrahepatic, common duct, or pancreatic ductal dilatation.  Highly suspicious cystic/solid right lower renal pole exophytic mass with imaging features highly suggestive of renal cell carcinoma or possibly cystic nephroma. Urology consultation and general surgery consultation recommended.  Right hepatic lobe hemangioma incidentally noted. Hepatic steatosis. _____________   Cardiac Catheterization 6.3.2015  Hemodynamic Findings: Central aortic pressure: 120/62 Left ventricular pressure: 127/7/15  Angiographic Findings:  Left main: Short segment. No obstructive disease.   Left Anterior Descending Artery: Moderate caliber vessel that courses to the apex. There is 40% proximal stenosis. The mid stented segment is patent without  restenosis. The distal vessel has diffuse non-obstructive plaque. The diagonal branches are small in caliber with no obstructive disease.   Circumflex Artery: Large dominant system with large bifurcating first obtuse marginal branch and three small caliber posterolateral branches. The OM branch has diffuse 50% proximal stenosis, no focally obstructive lesions. The three small caliber posterolateral branches all have diffuse moderate to severe stenosis that is unchanged from last cath.   Right Coronary Artery: Small non-dominant vessel with 40% mid vessel stenosis.  Left Ventricular Angiogram: LVEF=65% _____________   Laparoscopic Cholecystectomy 6.5.2015 _____________   History of Present Illness  58 year old male with the above complex problem list. He is status post percutaneous intervention and drug-eluting stent placement to the LAD in August of 2014. He has had intermittent exertional chest discomfort ever since that time. He was seen in clinic on April 28 with complaints of chest discomfort as well as right upper quadrant abdominal discomfort. His nitrate dose was titrated with some improvement in his chest pain. An abdominal ultrasound was performed revealing a 9 cm right upper quadrant complex cystic lesion in the area of the gallbladder fossa along with a 1.8 cm indeterminate hypoechoic lesion in the left hepatic lobe. He was scheduled for an abdominal MRI to be performed as an outpatient. Unfortunately, on the morning of admission he awoke at 2:30 AM with worsening substernal chest discomfort, dyspnea, nausea, and diaphoresis. He presented to the [facility] where ECG was nonacute and troponin was normal. Decision was made to admit him for further evaluation.  Hospital Course  Patient ruled out for myocardial infarction. His urine drug screen returned positive for cocaine. It was felt that ultimately he would require diagnostic catheterization however with this abnormal abdominal ultrasound, we  preferred to obtain abdominal MRI first, in case he required surgical intervention. Abdominal MRI was performed on June 3, revealing probable  stone or sludge within the gallbladder neck with mild gallbladder wall thickening and pericholecystic edema, suspicious for cholecystitis.  There was also a highly suspicious cystic/solid right lower renal pole exophytic mass with imaging features highly suggestive of renal cell carcinoma or possibly cystic nephroma. Urology consultation and general surgery consultation were recommended.  Patient was seen by general surgery with recommendation for arthroscopic cholecystectomy. Urology was also consulted and arrangements for outpatient followup with Dr. Phebe Colla will be made.  Understanding that patient would require at a minimum a laparoscopic cholecystectomy during this admission, he underwent diagnostic catheterization on June 3. This revealed a patent LAD stent in otherwise nonobstructive coronary artery disease and normal LV function. It was felt that his chest pain was most likely secondary to small vessel disease that is worsened with cocaine use and tobacco abuse. Complete cessation of tobacco and cocaine was recommended. It was felt that he he could safely undergo noncardiac surgery during this admission. His Plavix was held and on June 5 he underwent successful laparoscopic cholecystectomy. He tolerated the operation well and has been hemodynamically stable postoperatively. He'll be discharged home today in good condition with arrangements made for followup in general surgery clinic on June 30. He will be contacted by urology for followup. He'll be discharged home today in good condition.   Discharge Vitals Blood pressure 103/54, pulse 89, temperature 98.4 F (36.9 C), temperature source Oral, resp. rate 20, height 5\' 9"  (1.753 m), weight 242 lb (109.77 kg), SpO2 90.00%.  Filed Weights   12/21/13 0550 12/22/13 0618 12/23/13 0500  Weight: 238 lb 8.6 oz  (108.2 kg) 236 lb 14.4 oz (107.457 kg) 242 lb (109.77 kg)   Labs  CBC Lab Results  Component Value Date   WBC 10.1 12/20/2013   HGB 14.2 12/20/2013   HCT 41.2 12/20/2013   MCV 84.1 12/20/2013   PLT 221 09/25/1827    Basic Metabolic Panel Lab Results  Component Value Date   CREATININE 1.15 12/20/2013   BUN 14 12/20/2013   NA 137 12/20/2013   K 4.0 12/20/2013   CL 99 12/20/2013   CO2 25 12/20/2013    Liver Function Tests Lab Results  Component Value Date   ALT 17 12/19/2013   AST 15 12/19/2013   ALKPHOS 92 12/19/2013   BILITOT 0.5 12/19/2013    Cardiac Enzymes Lab Results  Component Value Date   TROPONINI <0.30 12/19/2013   Fasting Lipid Panel Lab Results  Component Value Date   CHOL 128 12/19/2013   HDL 31* 12/19/2013   LDLCALC 67 12/19/2013   TRIG 152* 12/19/2013   CHOLHDL 4.1 12/19/2013    Thyroid Function Tests Lab Results  Component Value Date   TSH 0.331* 12/18/2013    Disposition  Pt is being discharged home today in good condition.  Follow-up Plans & Appointments      Follow-up Information   Follow up with [facility] On 01/16/2014. (arrive by 1:15PM for a 1:45PM appt for a post op check)    Contact information:   [place]   [place] [address] [phone]       Follow up with Jacqualine Mau, PA-C. (as scheduled.)    Specialty:  Physician Assistant   Contact information:   [facility] [address] [place] [address] [phone]       Follow up with [name], MD. (we will arrange and contact you.)    Specialty:  Cardiology   Contact information:   [place] [address] [phone]       Follow up with [name], MD. (contact office for follow-up appointment regarding righ kidney mass.)    Specialty:  Urology   Contact information:   [address] [place] [address] [phone]       Discharge Medications    Medication List         acetaminophen 500 MG  tablet  Commonly known as:  TYLENOL  Take 1,000 mg by mouth every 8 (eight) hours as needed for moderate pain.     aspirin 81 MG chewable tablet  Chew 1 tablet (81 mg total) by mouth daily.     clopidogrel 75 MG tablet  Commonly known as:  PLAVIX  Take 1 tablet (75 mg total) by mouth daily with breakfast.     diltiazem 240 MG 24 hr capsule  Commonly known as:  CARDIZEM CD  Take 1 capsule (240 mg total) by mouth daily.     fish oil-omega-3 fatty acids 1000 MG capsule  Take 1 g by mouth 4 (four) times daily.     gabapentin 100 MG capsule  Commonly known as:  NEURONTIN  Take 300 mg by mouth at bedtime.     glipiZIDE 5 MG tablet  Commonly known as:  GLUCOTROL  Take 2.5 mg by mouth 2 (two) times daily before a meal.     isosorbide dinitrate 30 MG tablet  Commonly known as:  ISORDIL  Take 60 mg by mouth 2 (two) times daily.     lisinopril 10 MG tablet  Commonly known as:  PRINIVIL,ZESTRIL  Take 1 tablet (10 mg total) by mouth daily.     nitroGLYCERIN 0.4 MG SL tablet  Commonly known as:  NITROSTAT  Place 1 tablet (0.4 mg total) under the tongue every 5 (five) minutes as needed for chest pain.     ranitidine 300 MG tablet  Commonly known as:  ZANTAC  Take 300 mg by mouth at bedtime.     rosuvastatin 20 MG tablet  Commonly known as:  CRESTOR  Take 20 mg by mouth at bedtime.     sitaGLIPtin 100 MG tablet  Commonly known as:  JANUVIA  Take 100 mg by mouth daily.       Outstanding Labs/Studies  None  Duration of Discharge Encounter   Greater than 30 minutes including physician time.  Signed, Rogelia Mire NP 12/23/2013, 11:47 AM

## 2013-12-23 NOTE — Progress Notes (Signed)
I have seen and examined the patient and agree with the assessment and plans. [place] for d/c from our [facility]. Ninfa Linden  MD, FACS

## 2013-12-23 NOTE — Progress Notes (Signed)
1 Day Post-Op  Subjective: Tolerating full liquids, voiding, pain well controlled with oral pain meds, ambulating, no n/v, passing flatus.  Wants to go home.   Objective: Vital signs in last 24 hours: Temp:  [97.6 F (36.4 C)-99.6 F (37.6 C)] 98.4 F (36.9 C) (06/06 0500) Pulse Rate:  [65-93] 89 (06/06 0500) Resp:  [10-20] 20 (06/06 0500) BP: (106-157)/(61-91) 114/66 mmHg (06/06 0500) SpO2:  [90 %-99 %] 90 % (06/06 0500) Weight:  [242 lb (109.77 kg)] 242 lb (109.77 kg) (06/06 0500) Last BM Date: 12/18/13  Intake/Output from previous day: 06/05 0701 - 06/06 0700 In: 1700 [P.O.:600; I.V.:1100] Out: 20 [Blood:20] Intake/Output this shift:   PE General appearance: alert, cooperative and no distress GI: +bs, abdomen is soft, obese, incisions are c/d/i.  no evidence of peritonitis.   Lab Results:  No results found for this basename: WBC, HGB, HCT, PLT,  in the last 72 hours BMET No results found for this basename: NA, K, CL, CO2, GLUCOSE, BUN, CREATININE, CALCIUM,  in the last 72 hours PT/INR No results found for this basename: LABPROT, INR,  in the last 72 hours ABG No results found for this basename: PHART, PCO2, PO2, HCO3,  in the last 72 hours  Studies/Results: No results found.  Anti-infectives: Anti-infectives   None      Assessment/Plan: POD#1 laparoscopic cholecystectomy for acute cholecystitis---Dr. Ninfa Linden -stable for discharge from surgical standpoint, follow up arranged, instructions provided   LOS: 5 days    Juanice Warburton ANP-BC 12/23/2013 9:11 AM

## 2013-12-23 NOTE — Discharge Instructions (Signed)
**PLEASE REMEMBER TO BRING ALL OF YOUR MEDICATIONS TO EACH OF YOUR FOLLOW-UP OFFICE VISITS.  **STOP USING COCAINE AND TOBACCO**  LAPAROSCOPIC SURGERY: POST OP INSTRUCTIONS  1. DIET: Follow a light bland diet the first 24 hours after arrival home, such as soup, liquids, crackers, etc.  Be sure to include lots of fluids daily.  Avoid fast food or heavy meals as your are more likely to get nauseated.  Eat a low fat the next few days after surgery.   2. Take your usually prescribed home medications unless otherwise directed. 3. PAIN CONTROL: a. Pain is best controlled by a usual combination of three different methods TOGETHER: i. Ice/Heat ii. Over the counter pain medication iii. Prescription pain medication b. Most patients will experience some swelling and bruising around the incisions.  Ice packs or heating pads (30-60 minutes up to 6 times a day) will help. Use ice for the first few days to help decrease swelling and bruising, then switch to heat to help relax tight/sore spots and speed recovery.  Some people prefer to use ice alone, heat alone, alternating between ice & heat.  Experiment to what works for you.  Swelling and bruising can take several weeks to resolve.   c. It is helpful to take an over-the-counter pain medication regularly for the first few weeks.  Choose one of the following that works best for you: i. Naproxen (Aleve, etc)  Two 220mg  tabs twice a day ii. Ibuprofen (Advil, etc) Three 200mg  tabs four times a day (every meal & bedtime) iii. Acetaminophen (Tylenol, etc) 500-650mg  four times a day (every meal & bedtime) d. A  prescription for pain medication (such as oxycodone, hydrocodone, etc) should be given to you upon discharge.  Take your pain medication as prescribed.  i. If you are having problems/concerns with the prescription medicine (does not control pain, nausea, vomiting, rash, itching, etc), please call us [phone] to see if we need to switch you to a different  pain medicine that will work better for you and/or control your side effect better. ii. If you need a refill on your pain medication, please contact your pharmacy.  They will contact our office to request authorization. Prescriptions will not be filled after 5 pm or on week-ends. 4. Avoid getting constipated.  Between the surgery and the pain medications, it is common to experience some constipation.  Increasing fluid intake and taking a fiber supplement (such as Metamucil, Citrucel, FiberCon, MiraLax, etc) 1-2 times a day regularly will usually help prevent this problem from occurring.  A mild laxative (prune juice, Milk of Magnesia, MiraLax, etc) should be taken according to package directions if there are no bowel movements after 48 hours.   5. Watch out for diarrhea.  If you have many loose bowel movements, simplify your diet to bland foods & liquids for a few days.  Stop any stool softeners and decrease your fiber supplement.  Switching to mild anti-diarrheal medications (Kayopectate, Pepto Bismol) can help.  If this worsens or does not improve, please call us. 6. Wash / shower every day.  You may shower over the dressings as they are waterproof.  Continue to shower over incision(s) after the dressing is off. 7. Remove your waterproof bandages 5 days after surgery.  You may leave the incision open to air.  You may replace a dressing/Band-Aid to cover the incision for comfort if you wish.  8. ACTIVITIES as tolerated:   a. You may resume regular (light) daily activities beginning the  next day--such as daily self-care, walking, climbing stairs--gradually increasing activities as tolerated.  If you can walk 30 minutes without difficulty, it is safe to try more intense activity such as jogging, treadmill, bicycling, low-impact aerobics, swimming, etc. b. Save the most intensive and strenuous activity for last such as sit-ups, heavy lifting, contact sports, etc  Refrain from any heavy lifting or straining  until you are off narcotics for pain control.   c. DO NOT PUSH THROUGH PAIN.  Let pain be your guide: If it hurts to do something, don't do it.  Pain is your body warning you to avoid that activity for another week until the pain goes down. d. You may drive when you are no longer taking prescription pain medication, you can comfortably wear a seatbelt, and you can safely maneuver your car and apply brakes. e. Dennis Bast may have sexual intercourse when it is comfortable.  9. FOLLOW UP in our office a. Please call [facility] at [phone] to set up an appointment to see your surgeon in the office for a follow-up appointment approximately 2-3 weeks after your surgery. b. Make sure that you call for this appointment the day you arrive home to insure a convenient appointment time. 10. IF YOU HAVE DISABILITY OR FAMILY LEAVE FORMS, BRING THEM TO THE OFFICE FOR PROCESSING.  DO NOT GIVE THEM TO YOUR DOCTOR.   WHEN TO CALL us [phone]: 1. Poor pain control 2. Reactions / problems with new medications (rash/itching, nausea, etc)  3. Fever over 101.5 F (38.5 C) 4. Inability to urinate 5. Nausea and/or vomiting 6. Worsening swelling or bruising 7. Continued bleeding from incision. 8. Increased pain, redness, or drainage from the incision   The clinic staff is available to answer your questions during regular business hours (8:30am-5pm).  Please dont hesitate to call and ask to speak to one of our nurses for clinical concerns.   If you have a medical emergency, go to the nearest emergency room or call 911.  A surgeon from [facility] is always on call at the [facility], [place], [place], [place], [place]  [address] ? MAIN: [phone] ? TOLL FREE: [phone] ?  FAX [phone] [email or web address]

## 2013-12-23 NOTE — Progress Notes (Signed)
  Patient Name: [name] [place] Date of Encounter: 12/23/2013  Active Problems:   Tobacco abuse   Cocaine abuse   CAD (coronary artery disease), native coronary artery   Type 2 diabetes mellitus   Hyperlipidemia   Midsternal chest pain   Cholecystitis, acute   Renal mass, right   Length of Stay: 5  SUBJECTIVE  Feels great. Tolerating full diet. No abdominal or anginal pain. Wants to go home. Needs to come back for renal cell carcinoma surgery  CURRENT MEDS . aspirin  81 mg Oral Daily  . atorvastatin  40 mg Oral q1800  . clopidogrel  75 mg Oral Q breakfast  . diltiazem  240 mg Oral Daily  . gabapentin  300 mg Oral QHS  . glipiZIDE  2.5 mg Oral BID AC  . insulin aspart  0-15 Units Subcutaneous TID WC  . isosorbide mononitrate  30 mg Oral Daily  . linagliptin  5 mg Oral Daily  . lisinopril  10 mg Oral Daily  . omega-3 acid ethyl esters  1 g Oral q12n4p  . pantoprazole  40 mg Oral Q0600  . sodium chloride  3 mL Intravenous Q12H    OBJECTIVE   Intake/Output Summary (Last 24 hours) at 12/23/13 1111 Last data filed at 12/23/13 1036  Gross per 24 hour  Intake    843 ml  Output      0 ml  Net    843 ml   Filed Weights   12/21/13 0550 12/22/13 0618 12/23/13 0500  Weight: 238 lb 8.6 oz (108.2 kg) 236 lb 14.4 oz (107.457 kg) 242 lb (109.77 kg)    PHYSICAL EXAM Filed Vitals:   12/22/13 1300 12/22/13 2100 12/23/13 0500 12/23/13 1035  BP: 151/81 106/61 114/66 103/54  Pulse: 78 82 89   Temp:  99.6 F (37.6 C) 98.4 F (36.9 C)   TempSrc:      Resp:  20 20   Height:      Weight:   242 lb (109.77 kg)   SpO2: 90% 90% 90%    General: Alert, oriented x3, no distress Head: no evidence of trauma, PERRL, EOMI, no exophtalmos or lid lag, no myxedema, no xanthelasma; normal ears, nose and oropharynx Neck: normal jugular venous pulsations and no hepatojugular reflux; brisk carotid pulses without delay and no carotid bruits Chest: clear to auscultation, no signs of consolidation  by percussion or palpation, normal fremitus, symmetrical and full respiratory excursions Cardiovascular: normal position and quality of the apical impulse, regular rhythm, normal first and second heart sounds, no rubs or gallops, no murmur Abdomen: healthy appearing surgical dressing, no tenderness or distention, no masses by palpation, no abnormal pulsatility or arterial bruits, normal bowel sounds, no hepatosplenomegaly Extremities: no bleeding at radial access site, no clubbing, cyanosis or edema; 2+ radial, ulnar and brachial pulses bilaterally; 2+ right femoral, posterior tibial and dorsalis pedis pulses; 2+ left femoral, posterior tibial and dorsalis pedis pulses; no subclavian or femoral bruits Neurological: grossly nonfocal  LABS  Radiology Studies Imaging results have been reviewed and No results found.  TELE NSR   ASSESSMENT AND PLAN  10 months s/p LAD DES Low risk findings on cardiac workup preop. S/P uncomplicated cholecystectomy Renal call carcinoma - surgery date not yet firm. Resume clopidogrel until 5-7 days preop. Advised to abstain from cocaine and cigarettes.   Sanda Klein, MD, [facility] [phone] office [phone] pager 12/23/2013 11:11 AM

## 2013-12-26 ENCOUNTER — Encounter (HOSPITAL_COMMUNITY): Payer: Self-pay | Admitting: Surgery

## 2013-12-27 ENCOUNTER — Inpatient Hospital Stay (HOSPITAL_COMMUNITY)
Admission: EM | Admit: 2013-12-27 | Discharge: 2013-12-31 | DRG: 871 | Disposition: A | Discharge status: Home / Self Care | Payer: Medicaid Other | Attending: Internal Medicine | Admitting: Internal Medicine

## 2013-12-27 ENCOUNTER — Emergency Department (HOSPITAL_COMMUNITY): Payer: Medicaid Other

## 2013-12-27 ENCOUNTER — Encounter (HOSPITAL_COMMUNITY): Payer: Self-pay | Admitting: Emergency Medicine

## 2013-12-27 DIAGNOSIS — D649 Anemia, unspecified: Secondary | ICD-10-CM | POA: Diagnosis present

## 2013-12-27 DIAGNOSIS — E119 Type 2 diabetes mellitus without complications: Secondary | ICD-10-CM | POA: Diagnosis present

## 2013-12-27 DIAGNOSIS — J189 Pneumonia, unspecified organism: Secondary | ICD-10-CM | POA: Diagnosis present

## 2013-12-27 DIAGNOSIS — F17200 Nicotine dependence, unspecified, uncomplicated: Secondary | ICD-10-CM | POA: Diagnosis present

## 2013-12-27 DIAGNOSIS — Z79899 Other long term (current) drug therapy: Secondary | ICD-10-CM

## 2013-12-27 DIAGNOSIS — F1410 Cocaine abuse, uncomplicated: Secondary | ICD-10-CM | POA: Diagnosis present

## 2013-12-27 DIAGNOSIS — K219 Gastro-esophageal reflux disease without esophagitis: Secondary | ICD-10-CM | POA: Diagnosis present

## 2013-12-27 DIAGNOSIS — E1142 Type 2 diabetes mellitus with diabetic polyneuropathy: Secondary | ICD-10-CM | POA: Diagnosis present

## 2013-12-27 DIAGNOSIS — I10 Essential (primary) hypertension: Secondary | ICD-10-CM | POA: Diagnosis present

## 2013-12-27 DIAGNOSIS — J449 Chronic obstructive pulmonary disease, unspecified: Secondary | ICD-10-CM | POA: Diagnosis present

## 2013-12-27 DIAGNOSIS — I2510 Atherosclerotic heart disease of native coronary artery without angina pectoris: Secondary | ICD-10-CM | POA: Diagnosis present

## 2013-12-27 DIAGNOSIS — Z823 Family history of stroke: Secondary | ICD-10-CM

## 2013-12-27 DIAGNOSIS — Z720 Tobacco use: Secondary | ICD-10-CM | POA: Diagnosis present

## 2013-12-27 DIAGNOSIS — Z801 Family history of malignant neoplasm of trachea, bronchus and lung: Secondary | ICD-10-CM

## 2013-12-27 DIAGNOSIS — J4489 Other specified chronic obstructive pulmonary disease: Secondary | ICD-10-CM | POA: Diagnosis present

## 2013-12-27 DIAGNOSIS — A029 Salmonella infection, unspecified: Secondary | ICD-10-CM | POA: Diagnosis present

## 2013-12-27 DIAGNOSIS — IMO0002 Reserved for concepts with insufficient information to code with codable children: Secondary | ICD-10-CM | POA: Diagnosis present

## 2013-12-27 DIAGNOSIS — Z8673 Personal history of transient ischemic attack (TIA), and cerebral infarction without residual deficits: Secondary | ICD-10-CM

## 2013-12-27 DIAGNOSIS — Z91199 Patient's noncompliance with other medical treatment and regimen due to unspecified reason: Secondary | ICD-10-CM

## 2013-12-27 DIAGNOSIS — E785 Hyperlipidemia, unspecified: Secondary | ICD-10-CM | POA: Diagnosis present

## 2013-12-27 DIAGNOSIS — F411 Generalized anxiety disorder: Secondary | ICD-10-CM | POA: Diagnosis present

## 2013-12-27 DIAGNOSIS — Z8249 Family history of ischemic heart disease and other diseases of the circulatory system: Secondary | ICD-10-CM

## 2013-12-27 DIAGNOSIS — M129 Arthropathy, unspecified: Secondary | ICD-10-CM | POA: Diagnosis present

## 2013-12-27 DIAGNOSIS — E1149 Type 2 diabetes mellitus with other diabetic neurological complication: Secondary | ICD-10-CM | POA: Diagnosis present

## 2013-12-27 DIAGNOSIS — E118 Type 2 diabetes mellitus with unspecified complications: Secondary | ICD-10-CM

## 2013-12-27 DIAGNOSIS — E1165 Type 2 diabetes mellitus with hyperglycemia: Secondary | ICD-10-CM | POA: Diagnosis present

## 2013-12-27 DIAGNOSIS — Z9119 Patient's noncompliance with other medical treatment and regimen: Secondary | ICD-10-CM

## 2013-12-27 DIAGNOSIS — Z7902 Long term (current) use of antithrombotics/antiplatelets: Secondary | ICD-10-CM

## 2013-12-27 DIAGNOSIS — Z7982 Long term (current) use of aspirin: Secondary | ICD-10-CM

## 2013-12-27 DIAGNOSIS — Z9861 Coronary angioplasty status: Secondary | ICD-10-CM

## 2013-12-27 DIAGNOSIS — N2889 Other specified disorders of kidney and ureter: Secondary | ICD-10-CM | POA: Diagnosis present

## 2013-12-27 DIAGNOSIS — E871 Hypo-osmolality and hyponatremia: Secondary | ICD-10-CM | POA: Diagnosis present

## 2013-12-27 DIAGNOSIS — Z833 Family history of diabetes mellitus: Secondary | ICD-10-CM

## 2013-12-27 DIAGNOSIS — Z8701 Personal history of pneumonia (recurrent): Secondary | ICD-10-CM

## 2013-12-27 DIAGNOSIS — N289 Disorder of kidney and ureter, unspecified: Secondary | ICD-10-CM | POA: Diagnosis present

## 2013-12-27 DIAGNOSIS — A419 Sepsis, unspecified organism: Principal | ICD-10-CM | POA: Diagnosis present

## 2013-12-27 DIAGNOSIS — R0602 Shortness of breath: Secondary | ICD-10-CM | POA: Diagnosis present

## 2013-12-27 DIAGNOSIS — A0100 Typhoid fever, unspecified: Secondary | ICD-10-CM

## 2013-12-27 HISTORY — DX: Cocaine abuse, uncomplicated: F14.10

## 2013-12-27 HISTORY — DX: Acute cholecystitis: K81.0

## 2013-12-27 LAB — CBC WITH DIFFERENTIAL/PLATELET
BASOS PCT: 0 % (ref 0–1)
Basophils Absolute: 0 10*3/uL (ref 0.0–0.1)
EOS ABS: 0 10*3/uL (ref 0.0–0.7)
Eosinophils Relative: 0 % (ref 0–5)
HCT: 43.8 % (ref 39.0–52.0)
HEMOGLOBIN: 15.4 g/dL (ref 13.0–17.0)
Lymphocytes Relative: 6 % — ABNORMAL LOW (ref 12–46)
Lymphs Abs: 0.9 10*3/uL (ref 0.7–4.0)
MCH: 28.4 pg (ref 26.0–34.0)
MCHC: 35.2 g/dL (ref 30.0–36.0)
MCV: 80.8 fL (ref 78.0–100.0)
MONOS PCT: 11 % (ref 3–12)
Monocytes Absolute: 1.5 10*3/uL — ABNORMAL HIGH (ref 0.1–1.0)
NEUTROS PCT: 83 % — AB (ref 43–77)
Neutro Abs: 11.4 10*3/uL — ABNORMAL HIGH (ref 1.7–7.7)
Platelets: 196 10*3/uL (ref 150–400)
RBC: 5.42 MIL/uL (ref 4.22–5.81)
RDW: 14.5 % (ref 11.5–15.5)
WBC: 13.8 10*3/uL — ABNORMAL HIGH (ref 4.0–10.5)

## 2013-12-27 LAB — HEPATIC FUNCTION PANEL
ALBUMIN: 3.2 g/dL — AB (ref 3.5–5.2)
ALT: 30 U/L (ref 0–53)
AST: 26 U/L (ref 0–37)
Alkaline Phosphatase: 89 U/L (ref 39–117)
BILIRUBIN INDIRECT: 0.7 mg/dL (ref 0.3–0.9)
Bilirubin, Direct: 0.3 mg/dL (ref 0.0–0.3)
Total Bilirubin: 1 mg/dL (ref 0.3–1.2)
Total Protein: 8.4 g/dL — ABNORMAL HIGH (ref 6.0–8.3)

## 2013-12-27 LAB — BASIC METABOLIC PANEL
BUN: 16 mg/dL (ref 6–23)
CALCIUM: 9.5 mg/dL (ref 8.4–10.5)
CO2: 22 mEq/L (ref 19–32)
Chloride: 88 mEq/L — ABNORMAL LOW (ref 96–112)
Creatinine, Ser: 1.29 mg/dL (ref 0.50–1.35)
GFR, EST AFRICAN AMERICAN: 69 mL/min — AB (ref >90)
GFR, EST NON AFRICAN AMERICAN: 60 mL/min — AB (ref >90)
Glucose, Bld: 188 mg/dL — ABNORMAL HIGH (ref 70–99)
POTASSIUM: 4.6 meq/L (ref 3.7–5.3)
Sodium: 128 mEq/L — ABNORMAL LOW (ref 137–147)

## 2013-12-27 LAB — GLUCOSE, CAPILLARY
GLUCOSE-CAPILLARY: 137 mg/dL — AB (ref 70–99)
Glucose-Capillary: 158 mg/dL — ABNORMAL HIGH (ref 70–99)

## 2013-12-27 LAB — PRO B NATRIURETIC PEPTIDE: Pro B Natriuretic peptide (BNP): 234.5 pg/mL — ABNORMAL HIGH (ref 0–125)

## 2013-12-27 LAB — LIPASE, BLOOD: Lipase: 30 U/L (ref 11–59)

## 2013-12-27 LAB — TROPONIN I: Troponin I: <0.3 ng/mL (ref <0.30)

## 2013-12-27 LAB — LACTIC ACID, PLASMA: Lactic Acid, Venous: 0.9 mmol/L (ref 0.5–2.2)

## 2013-12-27 MED ORDER — ACETAMINOPHEN 500 MG PO TABS
1000.0000 mg | ORAL_TABLET | Freq: Once | ORAL | Status: AC
  Administered 2013-12-27: 1000 mg via ORAL
  Filled 2013-12-27: qty 2

## 2013-12-27 MED ORDER — VANCOMYCIN HCL 10 G IV SOLR
1500.0000 mg | Freq: Two times a day (BID) | INTRAVENOUS | Status: DC
  Administered 2013-12-27 – 2013-12-29 (×5): 1500 mg via INTRAVENOUS
  Filled 2013-12-27 (×10): qty 1500

## 2013-12-27 MED ORDER — NITROGLYCERIN 0.4 MG SL SUBL
0.4000 mg | SUBLINGUAL_TABLET | SUBLINGUAL | Status: DC | PRN
  Administered 2013-12-29: 0.4 mg via SUBLINGUAL
  Filled 2013-12-27: qty 1

## 2013-12-27 MED ORDER — ACETAMINOPHEN 325 MG PO TABS
650.0000 mg | ORAL_TABLET | Freq: Four times a day (QID) | ORAL | Status: DC | PRN
  Administered 2013-12-27: 650 mg via ORAL
  Filled 2013-12-27: qty 2

## 2013-12-27 MED ORDER — POTASSIUM CHLORIDE IN NACL 20-0.9 MEQ/L-% IV SOLN
INTRAVENOUS | Status: DC
  Administered 2013-12-27 – 2013-12-28 (×2): via INTRAVENOUS

## 2013-12-27 MED ORDER — DEXTROSE 5 % IV SOLN
1.0000 g | Freq: Once | INTRAVENOUS | Status: AC
  Administered 2013-12-27: 1 g via INTRAVENOUS
  Filled 2013-12-27: qty 1

## 2013-12-27 MED ORDER — CLOPIDOGREL BISULFATE 75 MG PO TABS
75.0000 mg | ORAL_TABLET | Freq: Every day | ORAL | Status: DC
  Administered 2013-12-28 – 2013-12-31 (×4): 75 mg via ORAL
  Filled 2013-12-27 (×4): qty 1

## 2013-12-27 MED ORDER — GABAPENTIN 300 MG PO CAPS
300.0000 mg | ORAL_CAPSULE | Freq: Every day | ORAL | Status: DC
  Administered 2013-12-27 – 2013-12-30 (×4): 300 mg via ORAL
  Filled 2013-12-27 (×4): qty 1

## 2013-12-27 MED ORDER — IPRATROPIUM BROMIDE 0.02 % IN SOLN: 0.5000 mg | Freq: Four times a day (QID) | RESPIRATORY_TRACT | Status: DC

## 2013-12-27 MED ORDER — SODIUM CHLORIDE 0.9 % IV SOLN: INTRAVENOUS | Status: DC

## 2013-12-27 MED ORDER — INSULIN ASPART 100 UNIT/ML ~~LOC~~ SOLN
0.0000 [IU] | Freq: Three times a day (TID) | SUBCUTANEOUS | Status: DC
  Administered 2013-12-27: 2 [IU] via SUBCUTANEOUS
  Administered 2013-12-28 (×2): 1 [IU] via SUBCUTANEOUS
  Administered 2013-12-28: 2 [IU] via SUBCUTANEOUS
  Administered 2013-12-29: 3 [IU] via SUBCUTANEOUS
  Administered 2013-12-29: 2 [IU] via SUBCUTANEOUS

## 2013-12-27 MED ORDER — ATORVASTATIN CALCIUM 40 MG PO TABS
40.0000 mg | ORAL_TABLET | Freq: Every day | ORAL | Status: DC
Start: 2013-12-27 — End: 2013-12-31
  Administered 2013-12-27 – 2013-12-30 (×4): 40 mg via ORAL
  Filled 2013-12-27 (×4): qty 1

## 2013-12-27 MED ORDER — DEXTROSE 5 % IV SOLN
1.0000 g | Freq: Three times a day (TID) | INTRAVENOUS | Status: DC
  Administered 2013-12-27 – 2013-12-31 (×11): 1 g via INTRAVENOUS
  Filled 2013-12-27 (×15): qty 1

## 2013-12-27 MED ORDER — ONDANSETRON HCL 4 MG/2ML IJ SOLN
4.0000 mg | Freq: Four times a day (QID) | INTRAMUSCULAR | Status: DC | PRN
  Administered 2013-12-27: 4 mg via INTRAVENOUS
  Filled 2013-12-27: qty 2

## 2013-12-27 MED ORDER — SODIUM CHLORIDE 0.9 % IV SOLN
INTRAVENOUS | Status: DC
  Administered 2013-12-27: 12:00:00 via INTRAVENOUS

## 2013-12-27 MED ORDER — ALBUTEROL SULFATE (2.5 MG/3ML) 0.083% IN NEBU: 2.5000 mg | INHALATION_SOLUTION | Freq: Four times a day (QID) | RESPIRATORY_TRACT | Status: DC

## 2013-12-27 MED ORDER — MORPHINE SULFATE 4 MG/ML IJ SOLN: 4.0000 mg | INTRAMUSCULAR | Status: DC | PRN

## 2013-12-27 MED ORDER — OXYCODONE HCL 5 MG PO TABS
5.0000 mg | ORAL_TABLET | ORAL | Status: DC | PRN
  Administered 2013-12-28 – 2013-12-30 (×4): 5 mg via ORAL
  Filled 2013-12-27 (×4): qty 1

## 2013-12-27 MED ORDER — VANCOMYCIN HCL 10 G IV SOLR
2000.0000 mg | Freq: Once | INTRAVENOUS | Status: AC
  Administered 2013-12-27: 2000 mg via INTRAVENOUS
  Filled 2013-12-27: qty 2000

## 2013-12-27 MED ORDER — BENZONATATE 100 MG PO CAPS
100.0000 mg | ORAL_CAPSULE | Freq: Three times a day (TID) | ORAL | Status: DC
  Administered 2013-12-27 – 2013-12-30 (×11): 100 mg via ORAL
  Filled 2013-12-27 (×12): qty 1

## 2013-12-27 MED ORDER — ENOXAPARIN SODIUM 40 MG/0.4ML ~~LOC~~ SOLN
40.0000 mg | SUBCUTANEOUS | Status: DC
  Administered 2013-12-27: 40 mg via SUBCUTANEOUS
  Filled 2013-12-27: qty 0.4

## 2013-12-27 MED ORDER — SENNA 8.6 MG PO TABS
1.0000 | ORAL_TABLET | Freq: Every day | ORAL | Status: DC
  Administered 2013-12-27 – 2013-12-29 (×3): 8.6 mg via ORAL
  Filled 2013-12-27 (×3): qty 1

## 2013-12-27 MED ORDER — ISOSORBIDE DINITRATE 20 MG PO TABS
60.0000 mg | ORAL_TABLET | Freq: Two times a day (BID) | ORAL | Status: DC
  Administered 2013-12-27 – 2013-12-30 (×7): 60 mg via ORAL
  Filled 2013-12-27 (×7): qty 3

## 2013-12-27 MED ORDER — GUAIFENESIN-DM 100-10 MG/5ML PO SYRP: 5.0000 mL | ORAL_SOLUTION | ORAL | Status: DC | PRN

## 2013-12-27 MED ORDER — FAMOTIDINE IN NACL 20-0.9 MG/50ML-% IV SOLN
20.0000 mg | Freq: Two times a day (BID) | INTRAVENOUS | Status: DC
  Administered 2013-12-27 – 2013-12-29 (×4): 20 mg via INTRAVENOUS
  Filled 2013-12-27 (×4): qty 50

## 2013-12-27 MED ORDER — INSULIN ASPART 100 UNIT/ML ~~LOC~~ SOLN: 0.0000 [IU] | Freq: Every day | SUBCUTANEOUS | Status: DC

## 2013-12-27 MED ORDER — INSULIN GLARGINE 100 UNIT/ML ~~LOC~~ SOLN
10.0000 [IU] | Freq: Every day | SUBCUTANEOUS | Status: DC
  Administered 2013-12-27 – 2013-12-29 (×3): 10 [IU] via SUBCUTANEOUS
  Filled 2013-12-27 (×5): qty 0.1

## 2013-12-27 MED ORDER — IPRATROPIUM-ALBUTEROL 0.5-2.5 (3) MG/3ML IN SOLN
3.0000 mL | Freq: Four times a day (QID) | RESPIRATORY_TRACT | Status: DC
  Administered 2013-12-27 – 2013-12-30 (×11): 3 mL via RESPIRATORY_TRACT
  Filled 2013-12-27 (×11): qty 3

## 2013-12-27 MED ORDER — LISINOPRIL 10 MG PO TABS
10.0000 mg | ORAL_TABLET | Freq: Every day | ORAL | Status: DC
  Administered 2013-12-28 – 2013-12-30 (×2): 10 mg via ORAL
  Filled 2013-12-27 (×3): qty 1

## 2013-12-27 MED ORDER — ASPIRIN 81 MG PO CHEW
81.0000 mg | CHEWABLE_TABLET | Freq: Every day | ORAL | Status: DC
  Administered 2013-12-28 – 2013-12-30 (×3): 81 mg via ORAL
  Filled 2013-12-27 (×3): qty 1

## 2013-12-27 MED ORDER — DILTIAZEM HCL ER COATED BEADS 240 MG PO CP24
240.0000 mg | ORAL_CAPSULE | Freq: Every day | ORAL | Status: DC
  Administered 2013-12-28 – 2013-12-30 (×3): 240 mg via ORAL
  Filled 2013-12-27 (×3): qty 1

## 2013-12-27 MED ORDER — DEXTROSE 5 % IV SOLN: 1.0000 g | Freq: Three times a day (TID) | INTRAVENOUS | Status: DC

## 2013-12-27 MED ORDER — SODIUM CHLORIDE 0.9 % IV BOLUS (SEPSIS)
1000.0000 mL | Freq: Once | INTRAVENOUS | Status: AC
  Administered 2013-12-27: 1000 mL via INTRAVENOUS

## 2013-12-27 MED ORDER — ACETAMINOPHEN 650 MG RE SUPP: 650.0000 mg | Freq: Four times a day (QID) | RECTAL | Status: DC | PRN

## 2013-12-27 NOTE — ED Notes (Signed)
Pt denies being on any oxygen at home.

## 2013-12-27 NOTE — ED Notes (Signed)
Pt family reports surgery was Friday 6/5.

## 2013-12-27 NOTE — Progress Notes (Signed)
ANTIBIOTIC CONSULT NOTE - INITIAL  Pharmacy Consult for Vancomycin & renal adjustment of antibiotics  Indication: pneumonia  No Known Allergies  Patient Measurements: Height: 5\' 9"  (175.3 cm) Weight: 239 lb (108.41 kg) IBW/kg (Calculated) : 70.7 Adjusted Body Weight: 86Kg  Vital Signs: Temp: 98.7 F (37.1 C) (06/10 1409) Temp src: Oral (06/10 1409) BP: 127/83 mmHg (06/10 1430) Pulse Rate: 91 (06/10 1430) Intake/Output from previous day:   Intake/Output from this shift:    Labs:  Recent Labs  12/27/13 0944  WBC 13.8*  HGB 15.4  PLT 196  CREATININE 1.29   Estimated Creatinine Clearance: 75.7 ml/min (by C-G formula based on Cr of 1.29). No results found for this basename: VANCOTROUGH, Corlis Leak, VANCORANDOM, GENTTROUGH, GENTPEAK, GENTRANDOM, TOBRATROUGH, TOBRAPEAK, TOBRARND, AMIKACINPEAK, AMIKACINTROU, AMIKACIN,  in the last 72 hours   Microbiology: Recent Results (from the past 720 hour(s))  MRSA PCR SCREENING     Status: None   Collection Time    12/18/13  7:04 PM      Result Value Ref Range Status   MRSA by PCR NEGATIVE  NEGATIVE Final   Comment:            The GeneXpert MRSA Assay (FDA     approved for NASAL specimens     only), is one component of a     comprehensive MRSA colonization     surveillance program. It is not     intended to diagnose MRSA     infection nor to guide or     monitor treatment for     MRSA infections.  SURGICAL PCR SCREEN     Status: None   Collection Time    12/21/13  9:36 PM      Result Value Ref Range Status   MRSA, PCR NEGATIVE  NEGATIVE Final   Staphylococcus aureus NEGATIVE  NEGATIVE Final   Comment:            The Xpert SA Assay (FDA     approved for NASAL specimens     in patients over 1 years of age),     is one component of     a comprehensive surveillance     program.  Test performance has     been validated by [employer] for patients greater     than or equal to 41 year old.     It is not intended   to diagnose infection nor to     guide or monitor treatment.  CULTURE, BLOOD (ROUTINE X 2)     Status: None   Collection Time    12/27/13 11:12 AM      Result Value Ref Range Status   Specimen Description Blood   Final   Special Requests NONE   Final   Culture NO GROWTH <24 HRS   Final   Report Status PENDING   Incomplete  CULTURE, BLOOD (ROUTINE X 2)     Status: None   Collection Time    12/27/13 11:30 AM      Result Value Ref Range Status   Specimen Description Blood   Final   Special Requests NONE   Final   Culture NO GROWTH <24 HRS   Final   Report Status PENDING   Incomplete    Medical History: Past Medical History  Diagnosis Date  . Hypertension   . TIA (transient ischemic attack) 1997    No residual neurological deficits.  . Hepatitis Late 1970s  Treated in the hospital, finished treatment course. No recurrence  since  . Noncompliance   . Coronary atherosclerosis of native coronary artery     a. 02/2013 Cath/PCI: LM nl, LAD: 50p, 62m (2.5x16 promus DES), LCX nl, OM1 min irregs, LPL/LPDA diff dzs, RCA nondom, mod diff dzs, EF 55%.  [name] COPD (chronic obstructive pulmonary disease)   . Hyperlipidemia   . History of pneumonia     "3-4 times" (03/09/2013)  . Type 2 diabetes mellitus 2011  . GERD (gastroesophageal reflux disease)   . Arthritis     "hands, knees" (03/09/2013)  . Substance abuse   . Abdominal ultrasound, abnormal     a. 11/2013: 9cm RUQ complex cystic lesion in the area of the GB fossa. Diff hepatic steatosis. 1.8cm indeterminate hypoechoic lesion in L hepatic lobe-->Abd MRI w/wo contrast pending.   Vancomycin 6/10 >> Cefepime 6/10 >>  Assessment: 58yo male admitted with SOB and abdominal pain.  Pt reports having cholecystectomy last Wed.  Pt has good renal fxn.  Estimated Creatinine Clearance: 75.7 ml/min (by C-G formula based on Cr of 1.29).  Goal of Therapy:  Vancomycin trough level 15-20 mcg/ml Eradicate infection.  Plan:  Vancomycin 2000mg  IV now  x 1 dose then Vancomycin 1500mg  IV q12hrs Check trough at steady state Cefepime 1gm IV q8hrs Monitor labs, renal fxn, and cultures  Hart Robinsons A 12/27/2013,3:17 PM

## 2013-12-27 NOTE — ED Notes (Signed)
edp at bedside  

## 2013-12-27 NOTE — ED Notes (Addendum)
Pt reports sudden onset of chest pain that is worse with a deep breath. Second EKG performed,EDP aware. No new orders given.

## 2013-12-27 NOTE — ED Notes (Signed)
Hospitalist at bedside 

## 2013-12-27 NOTE — ED Notes (Signed)
This RN returned from lunch and rounded on pt. Pt abx not infusing and pump is turned off. abx restarted and IV pump turned back on. Pt tolerating well.

## 2013-12-27 NOTE — ED Provider Notes (Signed)
CSN: [id number]     Arrival date & time 12/27/13  0920 History  This chart was scribed for Fredia Sorrow, MD by Jeanell Sparrow, ED Scribe. This patient was seen in room APA14/APA14 and the patient's care was started at 10:34 AM.  Chief Complaint  Patient presents with  . Shortness of Breath  . Abdominal Pain    Patient is a 58 y.o. male presenting with shortness of breath and abdominal pain. The history is provided by the patient, the EMS personnel and the spouse. No language interpreter was used.  Shortness of Breath Severity:  Moderate Onset quality:  Sudden Duration:  8 days Timing:  Intermittent Progression:  Unchanged Chronicity:  New Context comment:  Post cholecystectomy Relieved by:  None tried Worsened by:  Nothing tried Ineffective treatments:  None tried Associated symptoms: cough, fever, neck pain and sore throat   Associated symptoms: no abdominal pain, no chest pain, no headaches and no vomiting   Abdominal Pain Associated symptoms: chills, cough, fever, nausea, shortness of breath and sore throat   Associated symptoms: no chest pain, no diarrhea, no dysuria and no vomiting    HPI Comments: HPI Comments: [name] is a 58 y.o. male brought in by ambulance, who presents to the Emergency Department complaining of shortness of breath that started 7 days ago. Spouse reports that pt had a cholecystectomy 7 days ago at [facility] and has had SOB since then. Spouse states that his oxygen level at the hospital then was low. His oxygen saturation today at the ED was 93% on 2 liters of oxygen. She reports that pt does not use oxygen at home. She states that pt has been taking tylenol at home. Pt reports having associated nausea, and appetite loss. He also reports a subjective fever that started three days ago. His temperature at the ED today was 102.1. He states that he has been drinking normally and denies any emesis. Pt has a hx of DM and stents.   PCP- Dr. Roslynn Amble  Past Medical  History  Diagnosis Date  . Hypertension   . TIA (transient ischemic attack) 1997    No residual neurological deficits.  . Hepatitis Late 1970s    Treated in the hospital, finished treatment course. No recurrence  since  . Noncompliance   . Coronary atherosclerosis of native coronary artery     a. 02/2013 Cath/PCI: LM nl, LAD: 50p, 87m (2.5x16 promus DES), LCX nl, OM1 min irregs, LPL/LPDA diff dzs, RCA nondom, mod diff dzs, EF 55%.  [name] COPD (chronic obstructive pulmonary disease)   . Hyperlipidemia   . History of pneumonia     "3-4 times" (03/09/2013)  . Type 2 diabetes mellitus 2011  . GERD (gastroesophageal reflux disease)   . Arthritis     "hands, knees" (03/09/2013)  . Substance abuse   . Abdominal ultrasound, abnormal     a. 11/2013: 9cm RUQ complex cystic lesion in the area of the GB fossa. Diff hepatic steatosis. 1.8cm indeterminate hypoechoic lesion in L hepatic lobe-->Abd MRI w/wo contrast pending.   Past Surgical History  Procedure Laterality Date  . Cardiac catheterization  05/2012  . Coronary angioplasty with stent placement  03/09/2013    "1" (03/09/2013)  . Appendectomy  1970's  . Cholecystectomy N/A 12/22/2013    Procedure: LAPAROSCOPIC CHOLECYSTECTOMY ;  Surgeon: [name], MD;  Location: [facility] OR;  Service: General;  Laterality: N/A;   Family History  Problem Relation Age of Onset  . Hypertension    .  Diabetes    . Stroke    . Lung cancer Father     died @ 48.  . Cancer Mother     Thyroid - living in her 51's.  . Cancer Maternal Grandmother     Breast  . Cancer Maternal Grandfather     Throat and stomach  . CAD Father   . CAD Brother     alive @ 64 s/p stenting in his 16's.   History  Substance Use Topics  . Smoking status: Current Every Day Smoker -- 1.00 packs/day for 41 years    Types: Cigarettes  . Smokeless tobacco: Never Used  . Alcohol Use: No    Review of Systems  Constitutional: Positive for fever and chills.  HENT: Positive for sore  throat. Negative for rhinorrhea.   Eyes: Negative for visual disturbance.  Respiratory: Positive for cough and shortness of breath.   Cardiovascular: Negative for chest pain.  Gastrointestinal: Positive for nausea. Negative for vomiting, abdominal pain and diarrhea.  Genitourinary: Negative for dysuria.  Musculoskeletal: Positive for neck pain. Negative for back pain.  Neurological: Negative for headaches.  Hematological: Does not bruise/bleed easily.  Psychiatric/Behavioral: Negative for confusion.  All other systems reviewed and are negative.     Allergies  Review of patient's allergies indicates no known allergies.  Home Medications   Prior to Admission medications   Medication Sig Start Date End Date Taking? Authorizing Provider  acetaminophen (TYLENOL) 500 MG tablet Take 1,000 mg by mouth every 8 (eight) hours as needed for moderate pain.   Yes Historical Provider, MD  aspirin 81 MG chewable tablet Chew 1 tablet (81 mg total) by mouth daily. 11/05/12  Yes Reyne Dumas, MD  clopidogrel (PLAVIX) 75 MG tablet Take 1 tablet (75 mg total) by mouth daily with breakfast. 06/03/12  Yes Ivor Costa, MD  diltiazem (CARDIZEM CD) 240 MG 24 hr capsule Take 1 capsule (240 mg total) by mouth daily. 11/05/12  Yes Reyne Dumas, MD  fish oil-omega-3 fatty acids 1000 MG capsule Take 1 g by mouth 4 (four) times daily.   Yes Historical Provider, MD  gabapentin (NEURONTIN) 100 MG capsule Take 300 mg by mouth at bedtime.    Yes Historical Provider, MD  glipiZIDE (GLUCOTROL) 5 MG tablet Take 2.5 mg by mouth 2 (two) times daily before a meal.   Yes Historical Provider, MD  isosorbide dinitrate (ISORDIL) 30 MG tablet Take 60 mg by mouth 2 (two) times daily.   Yes Historical Provider, MD  lisinopril (PRINIVIL,ZESTRIL) 10 MG tablet Take 1 tablet (10 mg total) by mouth daily. 12/23/13  Yes Rogelia Mire, NP  ranitidine (ZANTAC) 300 MG tablet Take 300 mg by mouth at bedtime.   Yes Historical Provider, MD   rosuvastatin (CRESTOR) 20 MG tablet Take 20 mg by mouth at bedtime.   Yes Historical Provider, MD  sitaGLIPtin (JANUVIA) 100 MG tablet Take 100 mg by mouth daily.   Yes Historical Provider, MD  nitroGLYCERIN (NITROSTAT) 0.4 MG SL tablet Place 1 tablet (0.4 mg total) under the tongue every 5 (five) minutes as needed for chest pain. 06/03/12   Ivor Costa, MD   BP 136/80  Pulse 117  Temp(Src) 102.1 F (38.9 C) (Oral)  Resp 30  SpO2 92% Physical Exam  Nursing note and vitals reviewed. Constitutional: He is oriented to person, place, and time. He appears well-developed and well-nourished.  HENT:  Head: Normocephalic and atraumatic.  Eyes: EOM are normal.  Neck: Neck supple.  Cardiovascular: Regular rhythm  and normal heart sounds.   Tachycardic but regular   Pulmonary/Chest: Effort normal.  Decreased breath sounds on both sides  Abdominal: Soft. He exhibits distension. There is no tenderness.  No redness around surgical site  Musculoskeletal: Normal range of motion.  Neurological: He is alert and oriented to person, place, and time. No cranial nerve deficit.  Skin: Skin is warm and dry.  Psychiatric: He has a normal mood and affect. His behavior is normal.    ED Course  Procedures (including critical care time) DIAGNOSTIC STUDIES: Oxygen Saturation is 92% on 2 Liters of O2, normal by my interpretation.    COORDINATION OF CARE: 10:40 AM- Will consult pharmacyPt advised of plan for treatment which includes labs, medication, radiology and pt agrees.  Results for orders placed during the hospital encounter of 12/27/13  CULTURE, BLOOD (ROUTINE X 2)      Result Value Ref Range   Specimen Description Blood     Special Requests NONE     Culture NO GROWTH <24 HRS     Report Status PENDING    CULTURE, BLOOD (ROUTINE X 2)      Result Value Ref Range   Specimen Description Blood     Special Requests NONE     Culture NO GROWTH <24 HRS     Report Status PENDING    CBC WITH  DIFFERENTIAL      Result Value Ref Range   WBC 13.8 (*) 4.0 - 10.5 K/uL   RBC 5.42  4.22 - 5.81 MIL/uL   Hemoglobin 15.4  13.0 - 17.0 g/dL   HCT 43.8  39.0 - 52.0 %   MCV 80.8  78.0 - 100.0 fL   MCH 28.4  26.0 - 34.0 pg   MCHC 35.2  30.0 - 36.0 g/dL   RDW 14.5  11.5 - 15.5 %   Platelets 196  150 - 400 K/uL   Neutrophils Relative % 83 (*) 43 - 77 %   Neutro Abs 11.4 (*) 1.7 - 7.7 K/uL   Lymphocytes Relative 6 (*) 12 - 46 %   Lymphs Abs 0.9  0.7 - 4.0 K/uL   Monocytes Relative 11  3 - 12 %   Monocytes Absolute 1.5 (*) 0.1 - 1.0 K/uL   Eosinophils Relative 0  0 - 5 %   Eosinophils Absolute 0.0  0.0 - 0.7 K/uL   Basophils Relative 0  0 - 1 %   Basophils Absolute 0.0  0.0 - 0.1 K/uL  BASIC METABOLIC PANEL      Result Value Ref Range   Sodium 128 (*) 137 - 147 mEq/L   Potassium 4.6  3.7 - 5.3 mEq/L   Chloride 88 (*) 96 - 112 mEq/L   CO2 22  19 - 32 mEq/L   Glucose, Bld 188 (*) 70 - 99 mg/dL   BUN 16  6 - 23 mg/dL   Creatinine, Ser 1.29  0.50 - 1.35 mg/dL   Calcium 9.5  8.4 - 10.5 mg/dL   GFR calc non Af Amer 60 (*) >90 mL/min   GFR calc Af Amer 69 (*) >90 mL/min  TROPONIN I      Result Value Ref Range   Troponin I <0.30  <0.30 ng/mL  HEPATIC FUNCTION PANEL      Result Value Ref Range   Total Protein 8.4 (*) 6.0 - 8.3 g/dL   Albumin 3.2 (*) 3.5 - 5.2 g/dL   AST 26  0 - 37 U/L   ALT 30  0 - 53 U/L   Alkaline Phosphatase 89  39 - 117 U/L   Total Bilirubin 1.0  0.3 - 1.2 mg/dL   Bilirubin, Direct 0.3  0.0 - 0.3 mg/dL   Indirect Bilirubin 0.7  0.3 - 0.9 mg/dL  LIPASE, BLOOD      Result Value Ref Range   Lipase 30  11 - 59 U/L  PRO B NATRIURETIC PEPTIDE      Result Value Ref Range   Pro B Natriuretic peptide (BNP) 234.5 (*) 0 - 125 pg/mL  LACTIC ACID, PLASMA      Result Value Ref Range   Lactic Acid, Venous 0.9  0.5 - 2.2 mmol/L   Mr Abdomen W Wo Contrast  12/20/2013   CLINICAL DATA:  Possible gallbladder mass on prior ultrasound, right upper quadrant abdominal pain  EXAM:  MRI ABDOMEN WITHOUT AND WITH CONTRAST  TECHNIQUE: Multiplanar multisequence MR imaging of the abdomen was performed both before and after the administration of intravenous contrast.  CONTRAST:  66mL MULTIHANCE GADOBENATE DIMEGLUMINE 529 MG/ML IV SOLN  COMPARISON:  Abdominal ultrasound 11/22/2013  FINDINGS: Curvilinear atelectasis is noted at the right lung base.  Dependent hypo intense probable stones or sludge noted within the gallbladder neck image 26. There is mild gallbladder wall thickening and surrounding pericholecystic edema. No intrahepatic ductal dilatation is identified. Common duct and pancreatic duct are normal in caliber.  4.2 cm lateral segment left hepatic lobe cyst incidentally noted image 12 series 3. 1.6 cm mildly T2 hyperintense posterior segment right hepatic lobe mass is identified demonstrating progressive nodular centripetal enhancement compatible with a hemangioma. Hepatic steatosis noted.  Bilateral renal cortical T2 hyperintense cysts are noted. 7 mm left upper pole renal cortical proteinaceous cyst incidentally noted.  In the inferior aspect of the right kidney, there is a complex solid/ cystic mass measuring 4.2 x 3.3 cm with mixed internal signal. Enhancement is noted within a 3 cm central solid component. When measured including the cystic and solid component, the right lower renal pole mass measures overall 8.8 x 7.8 x 6.7 cm. An adjacent exophytic right lower renal pole dominant cyst with internal debris or possibly peripheral thin linear septation measures 8.3 cm maximally image 24 series 4. No abnormal enhancement is identified in the renal vein or IVC. Although not completely imaged on all sequences, no evidence for adjacent colonic or perinephric invasion. No hydroureteronephrosis. No lymphadenopathy or ascites.  Adrenal glands, spleen, and pancreas appear normal.  IMPRESSION: Probable stone or sludge noted within the gallbladder neck, with mild gallbladder wall thickening and  pericholecystic edema, suspicious for cholecystitis. No intrahepatic, common duct, or pancreatic ductal dilatation.  Highly suspicious cystic/solid right lower renal pole exophytic mass with imaging features highly suggestive of renal cell carcinoma or possibly cystic nephroma. Urology consultation and general surgery consultation recommended.  Right hepatic lobe hemangioma incidentally noted. Hepatic steatosis.  These results will be called to the ordering clinician or representative by the Radiologist Assistant, and communication documented in the PACS or zVision Dashboard.   Electronically Signed   By: Conchita Paris M.D.   On: 12/20/2013 10:42   Dg Chest Port 1 View  12/18/2013   CLINICAL DATA:  Chest pain  EXAM: PORTABLE CHEST - 1 VIEW  COMPARISON:  March 08, 2013  FINDINGS: There is a small area of infiltrate in the right base. Elsewhere lungs are clear. Heart is mildly enlarged with normal pulmonary vascularity. No adenopathy. No bone lesions.  IMPRESSION: Right base infiltrate. Lungs elsewhere clear. Stable  cardiac enlargement.   Electronically Signed   By: Lowella Grip M.D.   On: 12/18/2013 12:32    Medications  0.9 %  sodium chloride infusion ( Intravenous New Bag/Given 12/27/13 1216)  ceFEPIme (MAXIPIME) 1 g in dextrose 5 % 50 mL IVPB (not administered)  0.9 %  sodium chloride infusion (not administered)  sodium chloride 0.9 % bolus 1,000 mL (0 mLs Intravenous Stopped 12/27/13 1215)  ceFEPIme (MAXIPIME) 1 g in dextrose 5 % 50 mL IVPB (0 g Intravenous Stopped 12/27/13 1215)  acetaminophen (TYLENOL) tablet 1,000 mg (1,000 mg Oral Given 12/27/13 1107)  vancomycin (VANCOCIN) 2,000 mg in sodium chloride 0.9 % 500 mL IVPB (2,000 mg Intravenous New Bag/Given 12/27/13 1215)     Labs Review Labs Reviewed  CBC WITH DIFFERENTIAL - Abnormal; Notable for the following:    WBC 13.8 (*)    Neutrophils Relative % 83 (*)    Neutro Abs 11.4 (*)    Lymphocytes Relative 6 (*)    Monocytes Absolute  1.5 (*)    All other components within normal limits  BASIC METABOLIC PANEL - Abnormal; Notable for the following:    Sodium 128 (*)    Chloride 88 (*)    Glucose, Bld 188 (*)    GFR calc non Af Amer 60 (*)    GFR calc Af Amer 69 (*)    All other components within normal limits  HEPATIC FUNCTION PANEL - Abnormal; Notable for the following:    Total Protein 8.4 (*)    Albumin 3.2 (*)    All other components within normal limits  PRO B NATRIURETIC PEPTIDE - Abnormal; Notable for the following:    Pro B Natriuretic peptide (BNP) 234.5 (*)    All other components within normal limits  CULTURE, BLOOD (ROUTINE X 2)  CULTURE, BLOOD (ROUTINE X 2)  TROPONIN I  LIPASE, BLOOD  LACTIC ACID, PLASMA  URINALYSIS, ROUTINE W REFLEX MICROSCOPIC    Imaging Review Dg Chest 1 View  12/27/2013   CLINICAL DATA:  Shortness of breath.  Severe abdominal pain.  EXAM: CHEST - 1 VIEW  COMPARISON:  12/18/2013  FINDINGS: The heart size and mediastinal contours are within normal limits. Airspace consolidation within the right upper lobe is identified. Scar versus platelike atelectasis is noted in the left base. The visualized skeletal structures are unremarkable.  IMPRESSION: 1. Right upper lobe pneumonia.   Electronically Signed   By: Kerby Moors M.D.   On: 12/27/2013 10:34   US Abdomen Complete  12/27/2013   CLINICAL DATA:  Abdominal pain.  Pancreatitis  EXAM: ULTRASOUND ABDOMEN COMPLETE  COMPARISON:  12/20/2013  FINDINGS: Gallbladder:  Multiple stones are noted within the gallbladder. The gallbladder wall is thickened measuring 3.6 cm.  Common bile duct:  Diameter: Increase caliber of the common bile duct measuring 10.7 cm.  Liver:  Septated cyst within the left hepatic lobe measures 5 x 4.7 x 3.8 cm. Mild increased parenchymal echogenicity noted compatible with hepatic steatosis.  IVC:  No abnormality visualized.  Pancreas:  Visualized portion unremarkable.  Spleen:  Size and appearance within normal limits.   Right Kidney:  Length: 12.5 cm. Echogenicity within normal limits. Complex cyst is again noted measuring 9.3 x 9.4 x 8.5 cm.  Left Kidney:  Length: 12.6 cm. Echogenicity within normal limits. No mass or hydronephrosis visualized.  Abdominal aorta:  No aneurysm visualized.  Other findings:  None.  IMPRESSION: 1. Gallstones and gallbladder wall thickening concerning for cholecystitis. 2. Increase caliber of the  common bile duct. This is new from recent MRI from 12/20/2013. Cannot rule out choledocholithiasis. 3. Complicated cyst within the inferior pole of the right kidney is again noted. This is suggestive of cystic renal cell carcinoma. 4. Similar appearance of septated cyst within the left hepatic lobe. 5. Hepatic steatosis.   Electronically Signed   By: Kerby Moors M.D.   On: 12/27/2013 13:20     EKG Interpretation   Date/Time:  Wednesday December 27 2013 09:52:11 EDT Ventricular Rate:  112 PR Interval:  169 QRS Duration: 83 QT Interval:  289 QTC Calculation: 394 R Axis:   153 Text Interpretation:  Sinus tachycardia Right axis deviation Anteroseptal  infarct, old Borderline T abnormalities, inferior leads New right axis  deviation Confirmed by Parisa Pinela  MD, [place] [phone]) on 12/27/2013 10:05:56  AM      MDM   Final diagnoses:  HCAP (healthcare-associated pneumonia)  Hyponatremia     Patient status post gallbladder removal on Friday, June 5. Discharged home on Saturday, June 6 on June 7 started having fever shortness of breath never had a good appetite had persistent nausea. Patient is not having any significant abdominal pain. More nausea. Patient denied any of the abdominal pain. Workup here found a pneumonia which be consistent with a H. Pneumonia. Started on protocol antibiotics for that cultures done. Patient had an ultrasound done because CT scan now nonfunctional here today. Ultrasound originally read by radiologist as acute cholecystitis. Long discussion with the radiologist living  of note his gallbladder removed on Friday they are going to revisit the scan patient has surgical incisions should be no gallbladder there at all. Patient will be admitted by the hospitalist service here for the pneumonia. Patient has mild leukocytosis liver function tests look remarkably normal. Lactic acid is not elevated troponin is not elevated. Patient is not a hypoxic on 2 L of nasal cannula oxygen however he does require the oxygen. Patient admitted to telemetry.    I personally performed the services described in this documentation, which was scribed in my presence. The recorded information has been reviewed and is accurate.      Fredia Sorrow, MD 12/27/13 [phone]

## 2013-12-27 NOTE — ED Notes (Signed)
Lab at bedside obtaining blood cultures.

## 2013-12-27 NOTE — H&P (Signed)
[facility] History and Physical  [name] BZJ:[id number] DOB: [birth year] DOA: 12/27/2013  Referring physician: ED MD Dr. Rogene Houston PCP: Jacqualine Mau, PA-C   Chief Complaint: Shortness of breath, cough nausea, subjective fever, chills, pleuritic chest pain.  HPI: [name] is a 58 y.o. male with a history of coronary artery disease with stenting in the past, diabetes mellitus with diabetic neuropathy, hypertension, and anxiety. He was recently hospitalized at [facility] from 12/18/2013 through 12/23/2013 for evaluation of chest pain and abdominal pain. He underwent a cardiac catheterization on 12/20/13. The results revealed double vessel CAD with patent stent mid LAD, moderate nonobstructive disease in the circumflex and RCA, and normal LV systolic function. He was also diagnosed with acute cholecystitis. He underwent a laparoscopic cholecystectomy on 12/22/13. Following his discharge, he says that he has not been feeling well. Specifically, he has had shortness of breath, subjective fever and chills, and a nonproductive cough. He has also had nausea, but without vomiting and a poor appetite. He has some abdominal "soreness" in the right upper quadrant. His last bowel movement was yesterday and was "small", but normal for him. He denies black tarry stools or bright red blood per rectum. He is also had some pleuritic chest pain which is located substernally and to the right lower side.  In the emergency department, he is febrile with a temperature 102.1. He is mildly tachycardic with a heart rate ranging from 91-117. He is oxygenating between 89 and 93% on nasal cannula oxygen. His white blood cell count is 13.8. His serum sodium is 128. His glucose is 188. His troponin I is normal. His lactic acid is normal at 0.9. His chest x-ray reveals right upper lobe pneumonia. He is being admitted for further evaluation and management.   Review of Systems:  As above in the history of  present illness. In addition, he has had some anxiety and depression, but he denies suicidal ideation. Otherwise review of systems is negative.  Past Medical History  Diagnosis Date  . Hypertension   . TIA (transient ischemic attack) 1997    No residual neurological deficits.  . Hepatitis Late 1970s    Treated in the hospital, finished treatment course. No recurrence  since  . Noncompliance   . Coronary atherosclerosis of native coronary artery     a. 02/2013 Cath/PCI: LM nl, LAD: 50p, 10m (2.5x16 promus DES), LCX nl, OM1 min irregs, LPL/LPDA diff dzs, RCA nondom, mod diff dzs, EF 55%.  [name] COPD (chronic obstructive pulmonary disease)   . Hyperlipidemia   . History of pneumonia     "3-4 times" (03/09/2013)  . Type 2 diabetes mellitus 2011  . GERD (gastroesophageal reflux disease)   . Arthritis     "hands, knees" (03/09/2013)  . Substance abuse   . Abdominal ultrasound, abnormal     a. 11/2013: 9cm RUQ complex cystic lesion in the area of the GB fossa. Diff hepatic steatosis. 1.8cm indeterminate hypoechoic lesion in L hepatic lobe-->Abd MRI w/wo contrast pending.  [name] CAD (coronary artery disease), native coronary artery 11/04/2012    12/20/13 Cath-patent stents  . Cocaine abuse 06/01/2012  . Tobacco abuse 06/01/2012  . Renal mass, right 12/21/2013  . Cholecystitis, acute 12/20/2013    S/p Lap chole 6/5   Past Surgical History  Procedure Laterality Date  . Cardiac catheterization  05/2012  . Coronary angioplasty with stent placement  03/09/2013    "1" (03/09/2013)  . Appendectomy  1970's  . Cholecystectomy N/A  12/22/2013    Procedure: LAPAROSCOPIC CHOLECYSTECTOMY ;  Surgeon: [name], MD;  Location: [facility];  Service: General;  Laterality: N/A;   Social History: He is single. He has no children. He is unemployed. He receives financial assistance from his family. He has a 41 pack year smoking history. He usually smokes a half a pack of cigarettes per day, but has been only able to smoke 3  cigarettes per day lately. He stopped using cocaine a few years ago until he restarted a couple weeks ago. He says that he wants to stop again. He denies any other illicit drug use. He denies alcohol use.    No Known Allergies  Family History  Problem Relation Age of Onset  . Hypertension    . Diabetes    . Stroke    . Lung cancer Father     died @ 43.  . Cancer Mother     Thyroid - living in her 2's.  . Cancer Maternal Grandmother     Breast  . Cancer Maternal Grandfather     Throat and stomach  . CAD Father   . CAD Brother     alive @ 8 s/p stenting in his 12's.     Prior to Admission medications   Medication Sig Start Date End Date Taking? Authorizing Provider  acetaminophen (TYLENOL) 500 MG tablet Take 1,000 mg by mouth every 8 (eight) hours as needed for moderate pain.   Yes Historical Provider, MD  aspirin 81 MG chewable tablet Chew 1 tablet (81 mg total) by mouth daily. 11/05/12  Yes Reyne Dumas, MD  clopidogrel (PLAVIX) 75 MG tablet Take 1 tablet (75 mg total) by mouth daily with breakfast. 06/03/12  Yes Ivor Costa, MD  diltiazem (CARDIZEM CD) 240 MG 24 hr capsule Take 1 capsule (240 mg total) by mouth daily. 11/05/12  Yes Reyne Dumas, MD  fish oil-omega-3 fatty acids 1000 MG capsule Take 1 g by mouth 4 (four) times daily.   Yes Historical Provider, MD  gabapentin (NEURONTIN) 100 MG capsule Take 300 mg by mouth at bedtime.    Yes Historical Provider, MD  glipiZIDE (GLUCOTROL) 5 MG tablet Take 2.5 mg by mouth 2 (two) times daily before a meal.   Yes Historical Provider, MD  isosorbide dinitrate (ISORDIL) 30 MG tablet Take 60 mg by mouth 2 (two) times daily.   Yes Historical Provider, MD  lisinopril (PRINIVIL,ZESTRIL) 10 MG tablet Take 1 tablet (10 mg total) by mouth daily. 12/23/13  Yes Rogelia Mire, NP  ranitidine (ZANTAC) 300 MG tablet Take 300 mg by mouth at bedtime.   Yes Historical Provider, MD  rosuvastatin (CRESTOR) 20 MG tablet Take 20 mg by mouth at bedtime.    Yes Historical Provider, MD  sitaGLIPtin (JANUVIA) 100 MG tablet Take 100 mg by mouth daily.   Yes Historical Provider, MD  nitroGLYCERIN (NITROSTAT) 0.4 MG SL tablet Place 1 tablet (0.4 mg total) under the tongue every 5 (five) minutes as needed for chest pain. 06/03/12   Ivor Costa, MD   Physical Exam: Filed Vitals:   12/27/13 1600  BP: 117/67  Pulse: 101  Temp:   Resp: 34    BP 117/67  Pulse 101  Temp(Src) 99 F (37.2 C) (Oral)  Resp 34  Ht 5\' 9"  (1.753 m)  Wt 108.41 kg (239 lb)  BMI 35.28 kg/m2  SpO2 93%  General: Ill-appearing 57 year old Caucasian man laying in bed, in no acute distress. Eyes: PERRL, normal lids, irises &  conjunctiva ENT: Oropharynx has a full set of dentures. His mucous membranes are very dry. No posterior exudates or erythema. Nasal mucosa is dry without rhinorrhea. Tympanic membranes not examined. Neck: no LAD, masses or thyromegaly Cardiovascular: S1, S2, with tachycardia. Pedal pulses palpable. No pedal edema. Telemetry: SR, no arrhythmias  Respiratory: Occasional crackles and wheezes auscultated on the right. Breathing is mildly labored. Abdomen: Postoperative scar is healing with scant erythema epigastrium and right upper quadrant. Abdomen is obese with positive bowel sounds. Mildly tender over the old operative sites. No distention or guarding or masses palpated. Skin: no rash or induration seen on limited exam Musculoskeletal: grossly normal tone BUE/BLE Psychiatric: Flat affect speech fluent and appropriate Neurologic: grossly non-focal.Cranial nerves II through XII are intact. Strength is globally 5 over 5 throughout. Sensation is grossly intact.           Labs on Admission:  Basic Metabolic Panel:  Recent Labs Lab 12/27/13 0944  NA 128*  K 4.6  CL 88*  CO2 22  GLUCOSE 188*  BUN 16  CREATININE 1.29  CALCIUM 9.5   Liver Function Tests:  Recent Labs Lab 12/27/13 0944  AST 26  ALT 30  ALKPHOS 89  BILITOT 1.0  PROT 8.4*    ALBUMIN 3.2*    Recent Labs Lab 12/27/13 0944  LIPASE 30   No results found for this basename: AMMONIA,  in the last 168 hours CBC:  Recent Labs Lab 12/27/13 0944  WBC 13.8*  NEUTROABS 11.4*  HGB 15.4  HCT 43.8  MCV 80.8  PLT 196   Cardiac Enzymes:  Recent Labs Lab 12/27/13 0944  TROPONINI <0.30    BNP (last 3 results)  Recent Labs  03/09/13 0047 12/18/13 1158 12/27/13 0944  PROBNP 41.1 54.7 234.5*   CBG:  Recent Labs Lab 12/22/13 1046 12/22/13 1633 12/22/13 2049 12/23/13 0759 12/23/13 1138  GLUCAP 159* 180* 152* 204* 184*    Radiological Exams on Admission: Dg Chest 1 View  12/27/2013   CLINICAL DATA:  Shortness of breath.  Severe abdominal pain.  EXAM: CHEST - 1 VIEW  COMPARISON:  12/18/2013  FINDINGS: The heart size and mediastinal contours are within normal limits. Airspace consolidation within the right upper lobe is identified. Scar versus platelike atelectasis is noted in the left base. The visualized skeletal structures are unremarkable.  IMPRESSION: 1. Right upper lobe pneumonia.   Electronically Signed   By: Kerby Moors M.D.   On: 12/27/2013 10:34   US Abdomen Complete  12/27/2013   CLINICAL DATA:  Abdominal pain.  Pancreatitis  EXAM: ULTRASOUND ABDOMEN COMPLETE  COMPARISON:  12/20/2013  FINDINGS: Gallbladder:  Multiple stones are noted within the gallbladder. The gallbladder wall is thickened measuring 3.6 cm.  Common bile duct:  Diameter: Increase caliber of the common bile duct measuring 10.7 cm.  Liver:  Septated cyst within the left hepatic lobe measures 5 x 4.7 x 3.8 cm. Mild increased parenchymal echogenicity noted compatible with hepatic steatosis.  IVC:  No abnormality visualized.  Pancreas:  Visualized portion unremarkable.  Spleen:  Size and appearance within normal limits.  Right Kidney:  Length: 12.5 cm. Echogenicity within normal limits. Complex cyst is again noted measuring 9.3 x 9.4 x 8.5 cm.  Left Kidney:  Length: 12.6 cm.  Echogenicity within normal limits. No mass or hydronephrosis visualized.  Abdominal aorta:  No aneurysm visualized.  Other findings:  None.  IMPRESSION: 1. Gallstones and gallbladder wall thickening concerning for cholecystitis. 2. Increase caliber of the common bile  duct. This is new from recent MRI from 12/20/2013. Cannot rule out choledocholithiasis. 3. Complicated cyst within the inferior pole of the right kidney is again noted. This is suggestive of cystic renal cell carcinoma. 4. Similar appearance of septated cyst within the left hepatic lobe. 5. Hepatic steatosis.   Electronically Signed   By: Kerby Moors M.D.   On: 12/27/2013 13:20    EKG: Independently reviewed. As above in history of present illness.  Assessment/Plan Principal Problem:   SOB (shortness of breath) Active Problems:   HCAP (healthcare-associated pneumonia)   Diabetes mellitus type 2, uncontrolled, with cardiovascular complications   Hyponatremia   Tobacco abuse   Cocaine abuse   CAD (coronary artery disease), native coronary artery   Generalized anxiety disorder   Type 2 diabetes mellitus   Renal mass, right   1. This is a 58 year old man who presents with early sepsis secondary to healthcare acquired pneumonia. He is volume depleted which is causing hyponatremia and tachycardia. He is status post cardiac catheterization on 12/20/13 which showed patent stents and nonobstructive CAD. He is also status post laparoscopic cholecystectomy on 12/22/13. Ultrasound of his abdomen was ordered in the ED and it revealed the findings above. Obviously, the findings need to be amended as the patient had just undergone a cholecystectomy. ED physician, Dr. Carie Caddy did discuss this with the radiologist.   Plan:  1. Blood cultures were ordered in the ED. He was given cefepime and vancomycin. We will continue antibiotic therapy with vancomycin and cefepime. 2. Start IV fluid hydration.  3. Albuterol and Atrovent nebulizers. We'll  add Tessalon Perles for cough. Will continue oxygen started in the ED, titrated to keep his oxygen saturations greater than 92%. 4. Supportive treatment with as needed antiemetics and analgesics. 5. Hold oral hypoglycemic agents. Treated diabetes with sliding scale NovoLog. We'll order a hemoglobin A1c for tomorrow morning. 6. IV Pepcid. We'll hold off on solid foods and start clear liquids. When he is able to tolerate solid foods, will change H2 blocker to by mouth. 7. Continue most of his chronic medications as tolerated. 8. The patient was found to have an incidental right renal mass. This could be evaluated further in the outpatient setting with an elective general surgery or urology consultation.  Code Status: Full code Family Communication: Discussed briefly with brother and mom, but they left due to privacy requests per patient. Disposition Plan: Discharge to home when clinically improved and appropriate.  Time spent: One hour and 15 minutes.  [facility] Pager [phone]  **Disclaimer: This note may have been dictated with voice recognition software. Similar sounding words can inadvertently be transcribed and this note may contain transcription errors which may not have been corrected upon publication of note.**

## 2013-12-27 NOTE — ED Notes (Signed)
Per ems, pt reports having cholecystectomy last Wed at [facility] and now reports sob, and severe abd pain.  Pt reports some nausea but denies any vomiting.  Pt denies any GU sx.

## 2013-12-28 LAB — URINALYSIS, ROUTINE W REFLEX MICROSCOPIC
Glucose, UA: NEGATIVE mg/dL
LEUKOCYTES UA: NEGATIVE
Nitrite: NEGATIVE
PROTEIN: 30 mg/dL — AB
Specific Gravity, Urine: >1.03 — ABNORMAL HIGH (ref 1.005–1.030)
Urobilinogen, UA: 4 mg/dL — ABNORMAL HIGH (ref 0.0–1.0)
pH: 6 (ref 5.0–8.0)

## 2013-12-28 LAB — COMPREHENSIVE METABOLIC PANEL
ALT: 25 U/L (ref 0–53)
AST: 29 U/L (ref 0–37)
Albumin: 2.4 g/dL — ABNORMAL LOW (ref 3.5–5.2)
Alkaline Phosphatase: 78 U/L (ref 39–117)
BUN: 16 mg/dL (ref 6–23)
CALCIUM: 8.6 mg/dL (ref 8.4–10.5)
CO2: 20 mEq/L (ref 19–32)
Chloride: 96 mEq/L (ref 96–112)
Creatinine, Ser: 1.03 mg/dL (ref 0.50–1.35)
GFR calc non Af Amer: 78 mL/min — ABNORMAL LOW (ref >90)
GLUCOSE: 131 mg/dL — AB (ref 70–99)
Potassium: 4.8 mEq/L (ref 3.7–5.3)
Sodium: 131 mEq/L — ABNORMAL LOW (ref 137–147)
Total Bilirubin: 0.8 mg/dL (ref 0.3–1.2)
Total Protein: 6.9 g/dL (ref 6.0–8.3)

## 2013-12-28 LAB — CBC
HEMATOCRIT: 41.3 % (ref 39.0–52.0)
HEMOGLOBIN: 13.9 g/dL (ref 13.0–17.0)
MCH: 27.6 pg (ref 26.0–34.0)
MCHC: 33.7 g/dL (ref 30.0–36.0)
MCV: 81.9 fL (ref 78.0–100.0)
Platelets: 186 10*3/uL (ref 150–400)
RBC: 5.04 MIL/uL (ref 4.22–5.81)
RDW: 14.8 % (ref 11.5–15.5)
WBC: 10.9 10*3/uL — AB (ref 4.0–10.5)

## 2013-12-28 LAB — URINE MICROSCOPIC-ADD ON

## 2013-12-28 LAB — GLUCOSE, CAPILLARY
GLUCOSE-CAPILLARY: 123 mg/dL — AB (ref 70–99)
GLUCOSE-CAPILLARY: 136 mg/dL — AB (ref 70–99)
Glucose-Capillary: 145 mg/dL — ABNORMAL HIGH (ref 70–99)
Glucose-Capillary: 160 mg/dL — ABNORMAL HIGH (ref 70–99)

## 2013-12-28 LAB — HEMOGLOBIN A1C
Hgb A1c MFr Bld: 7.3 % — ABNORMAL HIGH (ref <5.7)
MEAN PLASMA GLUCOSE: 163 mg/dL — AB (ref <117)

## 2013-12-28 LAB — TSH: TSH: 0.309 u[IU]/mL — ABNORMAL LOW (ref 0.350–4.500)

## 2013-12-28 MED ORDER — ENOXAPARIN SODIUM 60 MG/0.6ML ~~LOC~~ SOLN
55.0000 mg | SUBCUTANEOUS | Status: DC
  Administered 2013-12-28 – 2013-12-30 (×3): 55 mg via SUBCUTANEOUS
  Filled 2013-12-28 (×3): qty 0.6

## 2013-12-28 NOTE — Care Management Note (Signed)
UR completed 

## 2013-12-28 NOTE — Progress Notes (Signed)
[facility] PROGRESS NOTE  [name] DVV:[id number] DOB: [birth year] DOA: 12/27/2013 PCP: Jacqualine Mau, PA-C Interim summary: [name] is a 58 y.o. male with a history of coronary artery disease with stenting in the past, diabetes mellitus with diabetic neuropathy, hypertension, and anxiety. He was recently hospitalized at [facility] from 12/18/2013 through 12/23/2013 for evaluation of chest pain and abdominal pain. He underwent a cardiac catheterization on 12/20/13. The results revealed double vessel CAD with patent stent mid LAD, moderate nonobstructive disease in the circumflex and RCA, and normal LV systolic function. He was also diagnosed with acute cholecystitis. He underwent a laparoscopic cholecystectomy on 12/22/13. Following his discharge, he says that he has not been feeling well. Specifically, he has had shortness of breath, subjective fever and chills, and a nonproductive cough. He has also had nausea, but without vomiting and a poor appetite.In the emergency department, he is febrile with a temperature 102.1. He is mildly tachycardic with a heart rate ranging from 91-117. He is oxygenating between 89 and 93% on nasal cannula oxygen. His white blood cell count is 13.8. His serum sodium is 128. His glucose is 188. His troponin I is normal. His lactic acid is normal at 0.9. His chest x-ray reveals right upper lobe pneumonia. He is being admitted for further evaluation and management.  he was started on IV antibiotics.    Assessment/Plan: 1. HCAP: - ADMITTED to telemetry and started on vancomycin and cefepime. Resume IV fluids. And nebs as needed. Tessalon tabs for cough. On [employer] oxygen to keep sats>90%.   2. Diabetes Mellitus: CBG (last 3)   Recent Labs  12/28/13 0738 12/28/13 1127 12/28/13 1708  GLUCAP 145* 160* 123*    SSI.  3. Nausea and vomitng: resolved and advanced diet to solids today.  Will change PPI to oral today.   4. Incidental  Right renal  mass: Outpatient follow up with surgery.   5. DVT prophylaxis.   Code Status: full code.  Family Communication: none at bedside.  Disposition Plan: remain inpatient.    Consultants:  none  Procedures:  none  Antibiotics:  Vancomycin   Cefepime.   HPI/Subjective: Feels better than yesterday.   Objective: Filed Vitals:   12/28/13 1436  BP: 100/59  Pulse: 91  Temp: 100.5 F (38.1 C)  Resp: 24    Intake/Output Summary (Last 24 hours) at 12/28/13 1737 Last data filed at 12/28/13 1728  Gross per 24 hour  Intake   3030 ml  Output      0 ml  Net   3030 ml   Filed Weights   12/27/13 1137  Weight: 108.41 kg (239 lb)    Exam:   General:  Alert comfortable.   Cardiovascular: s1s2  Respiratory: scattered rhonchi  Abdomen: soft NT ND BS+  Musculoskeletal: no pedal edema.   Data Reviewed: Basic Metabolic Panel:  Recent Labs Lab 12/27/13 0944 12/28/13 0536  NA 128* 131*  K 4.6 4.8  CL 88* 96  CO2 22 20  GLUCOSE 188* 131*  BUN 16 16  CREATININE 1.29 1.03  CALCIUM 9.5 8.6   Liver Function Tests:  Recent Labs Lab 12/27/13 0944 12/28/13 0536  AST 26 29  ALT 30 25  ALKPHOS 89 78  BILITOT 1.0 0.8  PROT 8.4* 6.9  ALBUMIN 3.2* 2.4*    Recent Labs Lab 12/27/13 0944  LIPASE 30   No results found for this basename: AMMONIA,  in the last 168 hours CBC:  Recent Labs  Lab 12/27/13 0944 12/28/13 0536  WBC 13.8* 10.9*  NEUTROABS 11.4*  --   HGB 15.4 13.9  HCT 43.8 41.3  MCV 80.8 81.9  PLT 196 186   Cardiac Enzymes:  Recent Labs Lab 12/27/13 0944  TROPONINI <0.30   BNP (last 3 results)  Recent Labs  03/09/13 0047 12/18/13 1158 12/27/13 0944  PROBNP 41.1 54.7 234.5*   CBG:  Recent Labs Lab 12/27/13 1820 12/27/13 2143 12/28/13 0738 12/28/13 1127 12/28/13 1708  GLUCAP 158* 137* 145* 160* 123*    Recent Results (from the past 240 hour(s))  MRSA PCR SCREENING     Status: None   Collection Time    12/18/13  7:04 PM       Result Value Ref Range Status   MRSA by PCR NEGATIVE  NEGATIVE Final   Comment:            The GeneXpert MRSA Assay (FDA     approved for NASAL specimens     only), is one component of a     comprehensive MRSA colonization     surveillance program. It is not     intended to diagnose MRSA     infection nor to guide or     monitor treatment for     MRSA infections.  SURGICAL PCR SCREEN     Status: None   Collection Time    12/21/13  9:36 PM      Result Value Ref Range Status   MRSA, PCR NEGATIVE  NEGATIVE Final   Staphylococcus aureus NEGATIVE  NEGATIVE Final   Comment:            The Xpert SA Assay (FDA     approved for NASAL specimens     in patients over 43 years of age),     is one component of     a comprehensive surveillance     program.  Test performance has     been validated by [employer] for patients greater     than or equal to 24 year old.     It is not intended     to diagnose infection nor to     guide or monitor treatment.  CULTURE, BLOOD (ROUTINE X 2)     Status: None   Collection Time    12/27/13 11:12 AM      Result Value Ref Range Status   Specimen Description BLOOD RIGHT HAND   Final   Special Requests     Final   Value: BOTTLES DRAWN AEROBIC AND ANAEROBIC AEB=8CC ANA=4CC   Culture NO GROWTH 1 DAY   Final   Report Status PENDING   Incomplete  CULTURE, BLOOD (ROUTINE X 2)     Status: None   Collection Time    12/27/13 11:30 AM      Result Value Ref Range Status   Specimen Description BLOOD LEFT ANTECUBITAL   Final   Special Requests     Final   Value: BOTTLES DRAWN AEROBIC AND ANAEROBIC AEB=8CC ANA=6CC   Culture NO GROWTH 1 DAY   Final   Report Status PENDING   Incomplete     Studies: Dg Chest 1 View  12/27/2013   CLINICAL DATA:  Shortness of breath.  Severe abdominal pain.  EXAM: CHEST - 1 VIEW  COMPARISON:  12/18/2013  FINDINGS: The heart size and mediastinal contours are within normal limits. Airspace consolidation within the right  upper lobe is identified. Scar versus platelike  atelectasis is noted in the left base. The visualized skeletal structures are unremarkable.  IMPRESSION: 1. Right upper lobe pneumonia.   Electronically Signed   By: Kerby Moors M.D.   On: 12/27/2013 10:34   US Abdomen Complete  12/27/2013   ADDENDUM REPORT: 12/27/2013 16:59  ADDENDUM: After the examination was performed, interpreted and marked red additional clinical history was provided indicating that the patient is recently status post cholecystectomy. This structure which was originally described as representing a gallbladder containing multiple stones likely reflects postsurgical change within the gallbladder fossa. This was discussed by telephone on 12/27/2013 at approximately 4 PM with Dr. Fredia Sorrow , who verbally acknowledged these results.   Electronically Signed   By: Kerby Moors M.D.   On: 12/27/2013 16:59   12/27/2013   CLINICAL DATA:  Abdominal pain.  Pancreatitis  EXAM: ULTRASOUND ABDOMEN COMPLETE  COMPARISON:  12/20/2013  FINDINGS: Gallbladder:  Multiple stones are noted within the gallbladder. The gallbladder wall is thickened measuring 3.6 cm.  Common bile duct:  Diameter: Increase caliber of the common bile duct measuring 10.7 cm.  Liver:  Septated cyst within the left hepatic lobe measures 5 x 4.7 x 3.8 cm. Mild increased parenchymal echogenicity noted compatible with hepatic steatosis.  IVC:  No abnormality visualized.  Pancreas:  Visualized portion unremarkable.  Spleen:  Size and appearance within normal limits.  Right Kidney:  Length: 12.5 cm. Echogenicity within normal limits. Complex cyst is again noted measuring 9.3 x 9.4 x 8.5 cm.  Left Kidney:  Length: 12.6 cm. Echogenicity within normal limits. No mass or hydronephrosis visualized.  Abdominal aorta:  No aneurysm visualized.  Other findings:  None.  IMPRESSION: 1. Gallstones and gallbladder wall thickening concerning for cholecystitis. 2. Increase caliber of the common bile  duct. This is new from recent MRI from 12/20/2013. Cannot rule out choledocholithiasis. 3. Complicated cyst within the inferior pole of the right kidney is again noted. This is suggestive of cystic renal cell carcinoma. 4. Similar appearance of septated cyst within the left hepatic lobe. 5. Hepatic steatosis.  Electronically Signed: By: Kerby Moors M.D. On: 12/27/2013 13:20    Scheduled Meds: . aspirin  81 mg Oral Daily  . atorvastatin  40 mg Oral q1800  . benzonatate  100 mg Oral TID  . ceFEPime (MAXIPIME) IV  1 g Intravenous Q8H  . clopidogrel  75 mg Oral Q breakfast  . diltiazem  240 mg Oral Daily  . enoxaparin (LOVENOX) injection  55 mg Subcutaneous Q24H  . famotidine (PEPCID) IV  20 mg Intravenous Q12H  . gabapentin  300 mg Oral QHS  . insulin aspart  0-5 Units Subcutaneous QHS  . insulin aspart  0-9 Units Subcutaneous TID WC  . insulin glargine  10 Units Subcutaneous QHS  . ipratropium-albuterol  3 mL Nebulization Q6H  . isosorbide dinitrate  60 mg Oral BID  . lisinopril  10 mg Oral Daily  . senna  1 tablet Oral QHS  . vancomycin  1,500 mg Intravenous Q12H   Continuous Infusions: . 0.9 % NaCl with KCl 20 mEq / L 125 mL/hr at 12/28/13 0550    Principal Problem:   SOB (shortness of breath) Active Problems:   Diabetes mellitus type 2, uncontrolled, with cardiovascular complications   Tobacco abuse   Cocaine abuse   CAD (coronary artery disease), native coronary artery   Generalized anxiety disorder   Type 2 diabetes mellitus   Renal mass, right   HCAP (healthcare-associated pneumonia)   Hyponatremia  Sepsis   Salmonella typhosa    Time spent: 30 minutes.     [facility] Pager [phone]. If 7PM-7AM, please contact night-coverage at [email or web address], password [facility] 12/28/2013, 5:37 PM  LOS: 1 day

## 2013-12-28 NOTE — Clinical Social Work Psychosocial (Signed)
Clinical Social Work Department BRIEF PSYCHOSOCIAL ASSESSMENT 12/28/2013  Patient:  [name], [name]     Account Number:  [id number]     Admit date:  12/27/2013  Clinical Social Worker:  Edwyna Shell, [place]  Date/Time:  12/28/2013 11:00 AM  Referred by:  Physician  Date Referred:  12/28/2013 Referred for  Substance Abuse   Other Referral:   Interview type:  Patient Other interview type:    PSYCHOSOCIAL DATA Living Status:  FAMILY Admitted from facility:   Level of care:   Primary support name:  Laval Cafaro Primary support relationship to patient:  PARENT Degree of support available:   Patient lives w mother while rehabbing from gall bladder surgery, usually lives in home provided by brother.  Good family support.    CURRENT CONCERNS Current Concerns  Substance Abuse   Other Concerns:    SOCIAL WORK ASSESSMENT / PLAN CSW met w patient at bedside, patient alert and oriented x4.  Patient tested positive for cocaine during recent procedure at [place] administered modified SBIRT questions as appropriate for drug use.  Prior to heart stent in August 2014, patient admits to approx once/week use of cocaine w friends.  Says he stopped using cocaine after stent placement.  Used once shortlly before gall bladder surgery resulting in positive drug screen. Says "I hope i never see cocaine again."  Intends to stop using entirely.    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  Had to stop work due to increasing physical disability due to heart problems.  Worked at various facliities and hospitals during his career.  Applied for disability at [facility] in March 2014, is awaiting determination.  Uses [facility] for his medical care and is very satisfied w their services.  Currently lives w mother as he is rehabbing from gall bladder surgery.  Normally livesalone in one of his brothers rental houses - "my brother takes care of all the bills."     Says he used cocaine "because I was feeling depressed." Was recently started on antidepressant at [facility]. Feelings of depression stem from "I cant work, cant walk outside because of chest pain and shortness of breath." Used to like to garden and work outdoors, but is now unable to do so.  Occasionally is able to drive w mother but this depends on his state of health at the time.  Would like to discuss ways to structure his life so he can regain ability to do activities he enjoys despite his health limitations. Has been told that he cannot have any further surgery for his 2 additional heart blockages as "my veins are not big enough."  Discouraged about lack of progress in regaining more normal function.    CSW asked RN CM to submit PT consult to determine patients needs and possible additional support for mobility and strength.  [facility]. Has family support.   patient scored   Assessment/plan status:  Psychosocial Support/Ongoing Assessment of Needs Other assessment/ plan:   Information/referral to community resources:   [employer] [facility] [employer]    PATIENT'S/FAMILY'S RESPONSE TO PLAN OF CARE: Patient would like help w physical health limitations and ways to maximize his ability to engage in activities he enjoys.  Depression stems from adjustment to illness, is working w [employer] on medication support for depression.     Edwyna Shell, LCSW Clinical Social Worker [phone])

## 2013-12-29 LAB — BASIC METABOLIC PANEL
BUN: 15 mg/dL (ref 6–23)
CHLORIDE: 96 meq/L (ref 96–112)
CO2: 21 meq/L (ref 19–32)
CREATININE: 1 mg/dL (ref 0.50–1.35)
Calcium: 8.3 mg/dL — ABNORMAL LOW (ref 8.4–10.5)
GFR calc non Af Amer: 81 mL/min — ABNORMAL LOW (ref >90)
Glucose, Bld: 181 mg/dL — ABNORMAL HIGH (ref 70–99)
POTASSIUM: 4.6 meq/L (ref 3.7–5.3)
SODIUM: 130 meq/L — AB (ref 137–147)

## 2013-12-29 LAB — RETICULOCYTES
RBC.: 4.29 MIL/uL (ref 4.22–5.81)
RETIC COUNT ABSOLUTE: 42.9 10*3/uL (ref 19.0–186.0)
Retic Ct Pct: 1 % (ref 0.4–3.1)

## 2013-12-29 LAB — GLUCOSE, CAPILLARY
Glucose-Capillary: 192 mg/dL — ABNORMAL HIGH (ref 70–99)
Glucose-Capillary: 206 mg/dL — ABNORMAL HIGH (ref 70–99)
Glucose-Capillary: 141 mg/dL — ABNORMAL HIGH (ref 70–99)
Glucose-Capillary: 137 mg/dL — ABNORMAL HIGH (ref 70–99)

## 2013-12-29 LAB — CBC
HCT: 34 % — ABNORMAL LOW (ref 39.0–52.0)
Hemoglobin: 11.5 g/dL — ABNORMAL LOW (ref 13.0–17.0)
MCH: 27.5 pg (ref 26.0–34.0)
MCHC: 33.8 g/dL (ref 30.0–36.0)
MCV: 81.3 fL (ref 78.0–100.0)
PLATELETS: 187 10*3/uL (ref 150–400)
RBC: 4.18 MIL/uL — AB (ref 4.22–5.81)
RDW: 14.9 % (ref 11.5–15.5)
WBC: 7.9 10*3/uL (ref 4.0–10.5)

## 2013-12-29 LAB — IRON AND TIBC
Iron: 23 ug/dL — ABNORMAL LOW (ref 42–135)
Saturation Ratios: 13 % — ABNORMAL LOW (ref 20–55)
TIBC: 184 ug/dL — ABNORMAL LOW (ref 215–435)
UIBC: 161 ug/dL (ref 125–400)

## 2013-12-29 MED ORDER — INSULIN ASPART 100 UNIT/ML ~~LOC~~ SOLN
0.0000 [IU] | Freq: Three times a day (TID) | SUBCUTANEOUS | Status: DC
  Administered 2013-12-29: 3 [IU] via SUBCUTANEOUS
  Administered 2013-12-30: 5 [IU] via SUBCUTANEOUS
  Administered 2013-12-30: 3 [IU] via SUBCUTANEOUS
  Administered 2013-12-31: 2 [IU] via SUBCUTANEOUS

## 2013-12-29 MED ORDER — FAMOTIDINE 20 MG PO TABS
20.0000 mg | ORAL_TABLET | Freq: Two times a day (BID) | ORAL | Status: DC
  Administered 2013-12-29 – 2013-12-30 (×3): 20 mg via ORAL
  Filled 2013-12-29 (×3): qty 1

## 2013-12-29 NOTE — Clinical Social Work Note (Signed)
Patient has not been seen by PT yet - awaiting recommendations for how to increase activity/mobility at home so patient can engage in pleasurable activity to help address his depressed feelings.  Says he is feeling "much better."  Edwyna Shell, LCSW Clinical Social Worker [phone])

## 2013-12-29 NOTE — Progress Notes (Signed)
PT SaO2 seems to run low chronic , 85 on room air at rest, he shows no dysemia and appears comfortable. Lungs appear clear.

## 2013-12-29 NOTE — Progress Notes (Signed)
Patient c/o chest pain suddenly.  Patient states that it feels as if something is on his chest.  Oxygen applied, ekg done, and nitro given.  Patient feels better after nitro and ekg shows nsr.  Notified mid-level and orders given and received.  Patient states that the way his medicine is administered in the hospital is different from how he takes it at home.  Will continue to monitor patient.

## 2013-12-29 NOTE — Care Management Note (Addendum)
    Page 1 of 1   12/29/2013     3:35:41 PM CARE MANAGEMENT NOTE 12/29/2013  Patient:  [name], [name]   Account Number:  [id number]  Date Initiated:  12/29/2013  Documentation initiated by:  Claretha Cooper  Subjective/Objective Assessment:   Pt states he lives alone, no insurance. May need medication assistance     Action/Plan:   Anticipated DC Date:     Anticipated DC Plan:  [place]  CM consult  [place] [facility]      Choice offered to / List presented to:             Status of service:  Completed, signed off Medicare Important Message given?   (If response is "NO", the following Medicare IM given date fields will be blank) Date Medicare IM given:   Date Additional Medicare IM given:    Discharge Disposition:    Per UR Regulation:    If discussed at Long Length of Stay Meetings, dates discussed:    Comments:  12/29/13 Claretha Cooper RN CM [place] voucher in Chart

## 2013-12-29 NOTE — Progress Notes (Signed)
Patient refused to where the yellow socks.  He states that they hurt his feet.

## 2013-12-29 NOTE — Progress Notes (Signed)
[facility] PROGRESS NOTE  LORN BUTCHER DPO:[id number] DOB: 03-16-1956 DOA: 12/27/2013 PCP: Jacqualine Mau, PA-C Interim summary: [name] is a 58 y.o. male with a history of coronary artery disease with stenting in the past, diabetes mellitus with diabetic neuropathy, hypertension, and anxiety. He was recently hospitalized at [facility] from 12/18/2013 through 12/23/2013 for evaluation of chest pain and abdominal pain. He underwent a cardiac catheterization on 12/20/13. The results revealed double vessel CAD with patent stent mid LAD, moderate nonobstructive disease in the circumflex and RCA, and normal LV systolic function. He was also diagnosed with acute cholecystitis. He underwent a laparoscopic cholecystectomy on 12/22/13. Following his discharge, he says that he has not been feeling well. Specifically, he has had shortness of breath, subjective fever and chills, and a nonproductive cough. He has also had nausea, but without vomiting and a poor appetite.In the emergency department, he is febrile with a temperature 102.1. He is mildly tachycardic with a heart rate ranging from 91-117. He is oxygenating between 89 and 93% on nasal cannula oxygen. His white blood cell count is 13.8. His serum sodium is 128. His glucose is 188. His troponin I is normal. His lactic acid is normal at 0.9. His chest x-ray reveals right upper lobe pneumonia. He is being admitted for further evaluation and management.  he was started on IV antibiotics.    Assessment/Plan: 1. HCAP: - ADMITTED to telemetry and started on vancomycin and cefepime. Resume IV fluids. And nebs as needed. Tessalon tabs for cough. On [place] oxygen to keep sats>90%. Will repeat CXR in am to evaluate for resolving pneumonia. Blood cultures negative so far.   2. Diabetes Mellitus: CBG (last 3)   Recent Labs  12/29/13 0726 12/29/13 1143 12/29/13 1624  GLUCAP 192* 206* 141*   hgba1c is 7.3 SSI.   3. Nausea and vomitng:  resolved and advanced diet to solids today.  Will change PPI to oral today.   4. Incidental  Right renal mass: Outpatient follow up with surgery.   Hyponatremia: improving, possibly from dehydration.   Abnormal TSH: Free  T3 & T4 ordered.    DVT prophylaxis.   Code Status: full code.  Family Communication: none at bedside.  Disposition Plan: remain inpatient.    Consultants:  none  Procedures:  none  Antibiotics:  Vancomycin   Cefepime.   HPI/Subjective: Feels better than yesterday.   Objective: Filed Vitals:   12/29/13 1448  BP: 100/55  Pulse: 82  Temp: 99.4 F (37.4 C)  Resp: 20    Intake/Output Summary (Last 24 hours) at 12/29/13 1702 Last data filed at 12/29/13 1253  Gross per 24 hour  Intake 5219.58 ml  Output    800 ml  Net 4419.58 ml   Filed Weights   12/27/13 1137  Weight: 108.41 kg (239 lb)    Exam:   General:  Alert comfortable.   Cardiovascular: s1s2  Respiratory: scattered rhonchi  Abdomen: soft NT ND BS+  Musculoskeletal: no pedal edema.   Data Reviewed: Basic Metabolic Panel:  Recent Labs Lab 12/27/13 0944 12/28/13 0536 12/29/13 0639  NA 128* 131* 130*  K 4.6 4.8 4.6  CL 88* 96 96  CO2 22 20 21   GLUCOSE 188* 131* 181*  BUN 16 16 15   CREATININE 1.29 1.03 1.00  CALCIUM 9.5 8.6 8.3*   Liver Function Tests:  Recent Labs Lab 12/27/13 0944 12/28/13 0536  AST 26 29  ALT 30 25  ALKPHOS 89 78  BILITOT 1.0  0.8  PROT 8.4* 6.9  ALBUMIN 3.2* 2.4*    Recent Labs Lab 12/27/13 0944  LIPASE 30   No results found for this basename: AMMONIA,  in the last 168 hours CBC:  Recent Labs Lab 12/27/13 0944 12/28/13 0536 12/29/13 0639  WBC 13.8* 10.9* 7.9  NEUTROABS 11.4*  --   --   HGB 15.4 13.9 11.5*  HCT 43.8 41.3 34.0*  MCV 80.8 81.9 81.3  PLT 196 186 187   Cardiac Enzymes:  Recent Labs Lab 12/27/13 0944  TROPONINI <0.30   BNP (last 3 results)  Recent Labs  03/09/13 0047 12/18/13 1158  12/27/13 0944  PROBNP 41.1 54.7 234.5*   CBG:  Recent Labs Lab 12/28/13 1708 12/28/13 2149 12/29/13 0726 12/29/13 1143 12/29/13 1624  GLUCAP 123* 136* 192* 206* 141*    Recent Results (from the past 240 hour(s))  SURGICAL PCR SCREEN     Status: None   Collection Time    12/21/13  9:36 PM      Result Value Ref Range Status   MRSA, PCR NEGATIVE  NEGATIVE Final   Staphylococcus aureus NEGATIVE  NEGATIVE Final   Comment:            The Xpert SA Assay (FDA     approved for NASAL specimens     in patients over 67 years of age),     is one component of     a comprehensive surveillance     program.  Test performance has     been validated by [employer] for patients greater     than or equal to 27 year old.     It is not intended     to diagnose infection nor to     guide or monitor treatment.  CULTURE, BLOOD (ROUTINE X 2)     Status: None   Collection Time    12/27/13 11:12 AM      Result Value Ref Range Status   Specimen Description BLOOD RIGHT HAND   Final   Special Requests     Final   Value: BOTTLES DRAWN AEROBIC AND ANAEROBIC AEB=8CC ANA=4CC   Culture NO GROWTH 1 DAY   Final   Report Status PENDING   Incomplete  CULTURE, BLOOD (ROUTINE X 2)     Status: None   Collection Time    12/27/13 11:30 AM      Result Value Ref Range Status   Specimen Description BLOOD LEFT ANTECUBITAL   Final   Special Requests     Final   Value: BOTTLES DRAWN AEROBIC AND ANAEROBIC AEB=8CC ANA=6CC   Culture NO GROWTH 1 DAY   Final   Report Status PENDING   Incomplete     Studies: No results found.  Scheduled Meds: . aspirin  81 mg Oral Daily  . atorvastatin  40 mg Oral q1800  . benzonatate  100 mg Oral TID  . ceFEPime (MAXIPIME) IV  1 g Intravenous Q8H  . clopidogrel  75 mg Oral Q breakfast  . diltiazem  240 mg Oral Daily  . enoxaparin (LOVENOX) injection  55 mg Subcutaneous Q24H  . famotidine  20 mg Oral BID  . gabapentin  300 mg Oral QHS  . insulin aspart  0-5 Units  Subcutaneous QHS  . insulin aspart  0-9 Units Subcutaneous TID WC  . insulin glargine  10 Units Subcutaneous QHS  . ipratropium-albuterol  3 mL Nebulization Q6H  . isosorbide dinitrate  60 mg Oral  BID  . lisinopril  10 mg Oral Daily  . senna  1 tablet Oral QHS  . vancomycin  1,500 mg Intravenous Q12H   Continuous Infusions:    Principal Problem:   SOB (shortness of breath) Active Problems:   Diabetes mellitus type 2, uncontrolled, with cardiovascular complications   Tobacco abuse   Cocaine abuse   CAD (coronary artery disease), native coronary artery   Generalized anxiety disorder   Type 2 diabetes mellitus   Renal mass, right   HCAP (healthcare-associated pneumonia)   Hyponatremia   Sepsis   Salmonella typhosa    Time spent: 30 minutes.     [facility] Pager [phone]. If 7PM-7AM, please contact night-coverage at [email or web address], password [facility] 12/29/2013, 5:02 PM  LOS: 2 days

## 2013-12-30 ENCOUNTER — Inpatient Hospital Stay (HOSPITAL_COMMUNITY): Payer: Medicaid Other

## 2013-12-30 LAB — BASIC METABOLIC PANEL
BUN: 13 mg/dL (ref 6–23)
CALCIUM: 8.6 mg/dL (ref 8.4–10.5)
CO2: 22 mEq/L (ref 19–32)
Chloride: 95 mEq/L — ABNORMAL LOW (ref 96–112)
Creatinine, Ser: 0.96 mg/dL (ref 0.50–1.35)
GFR calc Af Amer: >90 mL/min (ref >90)
GFR, EST NON AFRICAN AMERICAN: 90 mL/min — AB (ref >90)
Glucose, Bld: 169 mg/dL — ABNORMAL HIGH (ref 70–99)
Potassium: 4.5 mEq/L (ref 3.7–5.3)
Sodium: 131 mEq/L — ABNORMAL LOW (ref 137–147)

## 2013-12-30 LAB — FERRITIN: FERRITIN: 1575 ng/mL — AB (ref 22–322)

## 2013-12-30 LAB — VANCOMYCIN, TROUGH: Vancomycin Tr: 12.3 ug/mL (ref 10.0–20.0)

## 2013-12-30 LAB — VITAMIN B12: Vitamin B-12: 665 pg/mL (ref 211–911)

## 2013-12-30 LAB — OCCULT BLOOD X 1 CARD TO LAB, STOOL: FECAL OCCULT BLD: NEGATIVE

## 2013-12-30 LAB — GLUCOSE, CAPILLARY
GLUCOSE-CAPILLARY: 118 mg/dL — AB (ref 70–99)
Glucose-Capillary: 180 mg/dL — ABNORMAL HIGH (ref 70–99)

## 2013-12-30 LAB — T4, FREE: FREE T4: 1.05 ng/dL (ref 0.80–1.80)

## 2013-12-30 LAB — FOLATE: FOLATE: 15.4 ng/mL

## 2013-12-30 LAB — TROPONIN I: Troponin I: <0.3 ng/mL (ref <0.30)

## 2013-12-30 LAB — T3, FREE: T3, Free: 2.1 pg/mL — ABNORMAL LOW (ref 2.3–4.2)

## 2013-12-30 MED ORDER — IPRATROPIUM-ALBUTEROL 0.5-2.5 (3) MG/3ML IN SOLN
3.0000 mL | Freq: Two times a day (BID) | RESPIRATORY_TRACT | Status: DC
  Administered 2013-12-30 – 2013-12-31 (×2): 3 mL via RESPIRATORY_TRACT
  Filled 2013-12-30 (×2): qty 3

## 2013-12-30 MED ORDER — DIPHENHYDRAMINE HCL 25 MG PO CAPS
25.0000 mg | ORAL_CAPSULE | Freq: Once | ORAL | Status: AC
  Administered 2013-12-30: 25 mg via ORAL
  Filled 2013-12-30: qty 1

## 2013-12-30 MED ORDER — VANCOMYCIN HCL 10 G IV SOLR
1750.0000 mg | Freq: Two times a day (BID) | INTRAVENOUS | Status: DC
  Administered 2013-12-30 – 2013-12-31 (×2): 1750 mg via INTRAVENOUS
  Filled 2013-12-30 (×5): qty 1750

## 2013-12-30 NOTE — Progress Notes (Signed)
ANTIBIOTIC CONSULT NOTE -  Pharmacy Consult for Vancomycin & renal adjustment of antibiotics  Indication: pneumonia  No Known Allergies  Patient Measurements: Height: 5\' 9"  (175.3 cm) Weight: 239 lb (108.41 kg) IBW/kg (Calculated) : 70.7 Adjusted Body Weight: 86Kg  Vital Signs: Temp: 98.5 F (36.9 C) (06/13 0423) Temp src: Oral (06/13 0423) BP: 124/71 mmHg (06/13 0423) Pulse Rate: 78 (06/13 0821) Intake/Output from previous day: 06/12 0701 - 06/13 0700 In: 1200 [P.O.:1200] Out: 800 [Urine:800] Intake/Output from this shift: Total I/O In: 240 [P.O.:240] Out: -   Labs:  Recent Labs  12/28/13 0536 12/29/13 0639 12/30/13 0512  WBC 10.9* 7.9  --   HGB 13.9 11.5*  --   PLT 186 187  --   CREATININE 1.03 1.00 0.96   Estimated Creatinine Clearance: 101.8 ml/min (by C-G formula based on Cr of 0.96).  Recent Labs  12/30/13 1020  Worth 12.3     Microbiology: Recent Results (from the past 720 hour(s))  MRSA PCR SCREENING     Status: None   Collection Time    12/18/13  7:04 PM      Result Value Ref Range Status   MRSA by PCR NEGATIVE  NEGATIVE Final   Comment:            The GeneXpert MRSA Assay (FDA     approved for NASAL specimens     only), is one component of a     comprehensive MRSA colonization     surveillance program. It is not     intended to diagnose MRSA     infection nor to guide or     monitor treatment for     MRSA infections.  SURGICAL PCR SCREEN     Status: None   Collection Time    12/21/13  9:36 PM      Result Value Ref Range Status   MRSA, PCR NEGATIVE  NEGATIVE Final   Staphylococcus aureus NEGATIVE  NEGATIVE Final   Comment:            The Xpert SA Assay (FDA     approved for NASAL specimens     in patients over 5 years of age),     is one component of     a comprehensive surveillance     program.  Test performance has     been validated by [employer] for patients greater     than or equal to 61 year old.     It is  not intended     to diagnose infection nor to     guide or monitor treatment.  CULTURE, BLOOD (ROUTINE X 2)     Status: None   Collection Time    12/27/13 11:12 AM      Result Value Ref Range Status   Specimen Description BLOOD RIGHT HAND   Final   Special Requests     Final   Value: BOTTLES DRAWN AEROBIC AND ANAEROBIC AEB=8CC ANA=4CC   Culture NO GROWTH 3 DAYS   Final   Report Status PENDING   Incomplete  CULTURE, BLOOD (ROUTINE X 2)     Status: None   Collection Time    12/27/13 11:30 AM      Result Value Ref Range Status   Specimen Description BLOOD LEFT ANTECUBITAL   Final   Special Requests     Final   Value: BOTTLES DRAWN AEROBIC AND ANAEROBIC AEB=8CC ANA=6CC   Culture NO GROWTH 3 DAYS  Final   Report Status PENDING   Incomplete    Medical History: Past Medical History  Diagnosis Date  . Hypertension   . TIA (transient ischemic attack) 1997    No residual neurological deficits.  . Hepatitis Late 1970s    Treated in the hospital, finished treatment course. No recurrence  since  . Noncompliance   . Coronary atherosclerosis of native coronary artery     a. 02/2013 Cath/PCI: LM nl, LAD: 50p, 28m (2.5x16 promus DES), LCX nl, OM1 min irregs, LPL/LPDA diff dzs, RCA nondom, mod diff dzs, EF 55%.  [name] COPD (chronic obstructive pulmonary disease)   . Hyperlipidemia   . History of pneumonia     "3-4 times" (03/09/2013)  . Type 2 diabetes mellitus 2011  . GERD (gastroesophageal reflux disease)   . Arthritis     "hands, knees" (03/09/2013)  . Substance abuse   . Abdominal ultrasound, abnormal     a. 11/2013: 9cm RUQ complex cystic lesion in the area of the GB fossa. Diff hepatic steatosis. 1.8cm indeterminate hypoechoic lesion in L hepatic lobe-->Abd MRI w/wo contrast pending.  [name] CAD (coronary artery disease), native coronary artery 11/04/2012    12/20/13 Cath-patent stents  . Cocaine abuse 06/01/2012  . Tobacco abuse 06/01/2012  . Renal mass, right 12/21/2013  . Cholecystitis, acute  12/20/2013    S/p Lap chole 6/5   Vancomycin 6/10 >> Cefepime 6/10 >>  Assessment: 58yo male admitted with SOB and abdominal pain.  Pt reports having cholecystectomy last Wed.  Pt has good renal fxn.  Estimated Creatinine Clearance: 101.8 ml/min (by C-G formula based on Cr of 0.96). Vancomycin trough below goal  Goal of Therapy:  Vancomycin trough level 15-20 mcg/ml Eradicate infection.  Plan:  Change Vancomycin to 1750mg  IV q12hrs Check trough at steady state Continue Cefepime 1gm IV q8hrs Monitor labs, renal fxn, and cultures  Abner Greenspan, Osaze Hubbert Bennett 12/30/2013,11:40 AM

## 2013-12-30 NOTE — Progress Notes (Signed)
[facility] PROGRESS NOTE  [name] CWC:[id number] DOB: 1956/01/22 DOA: 12/27/2013 PCP: Jacqualine Mau, PA-C Interim summary: [name] is a 58 y.o. male with a history of coronary artery disease with stenting in the past, diabetes mellitus with diabetic neuropathy, hypertension, and anxiety. He was recently hospitalized at [facility] from 12/18/2013 through 12/23/2013 for evaluation of chest pain and abdominal pain. He underwent a cardiac catheterization on 12/20/13. The results revealed double vessel CAD with patent stent mid LAD, moderate nonobstructive disease in the circumflex and RCA, and normal LV systolic function. He was also diagnosed with acute cholecystitis. He underwent a laparoscopic cholecystectomy on 12/22/13. Following his discharge, he says that he has not been feeling well. Specifically, he has had shortness of breath, subjective fever and chills, and a nonproductive cough. He has also had nausea, but without vomiting and a poor appetite.In the emergency department, he is febrile with a temperature 102.1. He is mildly tachycardic with a heart rate ranging from 91-117. He is oxygenating between 89 and 93% on nasal cannula oxygen. His white blood cell count is 13.8. His serum sodium is 128. His glucose is 188. His troponin I is normal. His lactic acid is normal at 0.9. His chest x-ray reveals right upper lobe pneumonia. He is being admitted for further evaluation and management.  he was started on IV antibiotics.    Assessment/Plan: 1. HCAP: - ADMITTED to telemetry and started on vancomycin and cefepime. Resume IV fluids. And nebs as needed. Tessalon tabs for cough. On [place] oxygen to keep sats>90%. repeat CXRshows persistent pneumonia. Blood cultures negative so far.   2. Diabetes Mellitus: CBG (last 3)   Recent Labs  12/29/13 2142 12/30/13 0710 12/30/13 1216  GLUCAP 137* 118* 180*   hgba1c is 7.3 SSI.   3. Nausea and vomitng: resolved and advanced diet  to solids today.  Will change PPI to oral today.   4. Incidental  Right renal mass: Outpatient follow up with surgery.   Hyponatremia: improving, possibly from dehydration.   Abnormal TSH: Free  T3 & T4 ordered and free t4 normal, and free t3 mildly low.    Anemia: - mild drop. Normocytic. Stool for occult negative. Anemia panel shows elevated ferritin.  - continue to monitor.     DVT prophylaxis.   Code Status: full code.  Family Communication: family  at bedside.  Disposition Plan: remain inpatient.    Consultants:  none  Procedures:  none  Antibiotics:  Vancomycin   Cefepime.   HPI/Subjective: Feels better than yesterday. No fever orchills. Wants to know when he can go home.   Objective: Filed Vitals:   12/30/13 0821  BP:   Pulse: 78  Temp:   Resp: 18    Intake/Output Summary (Last 24 hours) at 12/30/13 1353 Last data filed at 12/30/13 0900  Gross per 24 hour  Intake    960 ml  Output    800 ml  Net    160 ml   Filed Weights   12/27/13 1137  Weight: 108.41 kg (239 lb)    Exam:   General:  Alert comfortable.   Cardiovascular: s1s2  Respiratory: scattered rhonchi  Abdomen: soft NT ND BS+  Musculoskeletal: no pedal edema.   Data Reviewed: Basic Metabolic Panel:  Recent Labs Lab 12/27/13 0944 12/28/13 0536 12/29/13 0639 12/30/13 0512  NA 128* 131* 130* 131*  K 4.6 4.8 4.6 4.5  CL 88* 96 96 95*  CO2 22 20 21  22  GLUCOSE 188* 131* 181* 169*  BUN 16 16 15 13   CREATININE 1.29 1.03 1.00 0.96  CALCIUM 9.5 8.6 8.3* 8.6   Liver Function Tests:  Recent Labs Lab 12/27/13 0944 12/28/13 0536  AST 26 29  ALT 30 25  ALKPHOS 89 78  BILITOT 1.0 0.8  PROT 8.4* 6.9  ALBUMIN 3.2* 2.4*    Recent Labs Lab 12/27/13 0944  LIPASE 30   No results found for this basename: AMMONIA,  in the last 168 hours CBC:  Recent Labs Lab 12/27/13 0944 12/28/13 0536 12/29/13 0639  WBC 13.8* 10.9* 7.9  NEUTROABS 11.4*  --   --   HGB 15.4  13.9 11.5*  HCT 43.8 41.3 34.0*  MCV 80.8 81.9 81.3  PLT 196 186 187   Cardiac Enzymes:  Recent Labs Lab 12/27/13 0944 12/29/13 2350  TROPONINI <0.30 <0.30   BNP (last 3 results)  Recent Labs  03/09/13 0047 12/18/13 1158 12/27/13 0944  PROBNP 41.1 54.7 234.5*   CBG:  Recent Labs Lab 12/29/13 1143 12/29/13 1624 12/29/13 2142 12/30/13 0710 12/30/13 1216  GLUCAP 206* 141* 137* 118* 180*    Recent Results (from the past 240 hour(s))  SURGICAL PCR SCREEN     Status: None   Collection Time    12/21/13  9:36 PM      Result Value Ref Range Status   MRSA, PCR NEGATIVE  NEGATIVE Final   Staphylococcus aureus NEGATIVE  NEGATIVE Final   Comment:            The Xpert SA Assay (FDA     approved for NASAL specimens     in patients over 68 years of age),     is one component of     a comprehensive surveillance     program.  Test performance has     been validated by [employer] for patients greater     than or equal to 31 year old.     It is not intended     to diagnose infection nor to     guide or monitor treatment.  CULTURE, BLOOD (ROUTINE X 2)     Status: None   Collection Time    12/27/13 11:12 AM      Result Value Ref Range Status   Specimen Description BLOOD RIGHT HAND   Final   Special Requests     Final   Value: BOTTLES DRAWN AEROBIC AND ANAEROBIC AEB=8CC ANA=4CC   Culture NO GROWTH 3 DAYS   Final   Report Status PENDING   Incomplete  CULTURE, BLOOD (ROUTINE X 2)     Status: None   Collection Time    12/27/13 11:30 AM      Result Value Ref Range Status   Specimen Description BLOOD LEFT ANTECUBITAL   Final   Special Requests     Final   Value: BOTTLES DRAWN AEROBIC AND ANAEROBIC AEB=8CC ANA=6CC   Culture NO GROWTH 3 DAYS   Final   Report Status PENDING   Incomplete     Studies: Dg Chest 2 View  12/30/2013   CLINICAL DATA:  Followup pneumonia  EXAM: CHEST  2 VIEW  COMPARISON:  None.  FINDINGS: Cardiomediastinal silhouette is stable. Persistent  infiltrate/ pneumonia in right upper lobe. Left lung is clear. Mild degenerative changes thoracic spine.  IMPRESSION: Persistent infiltrate/ pneumonia in right upper lobe. Follow-up to resolution is recommended.   Electronically Signed   By: Orlean Bradford.D.  On: 12/30/2013 09:10    Scheduled Meds: . aspirin  81 mg Oral Daily  . atorvastatin  40 mg Oral q1800  . benzonatate  100 mg Oral TID  . ceFEPime (MAXIPIME) IV  1 g Intravenous Q8H  . clopidogrel  75 mg Oral Q breakfast  . diltiazem  240 mg Oral Daily  . enoxaparin (LOVENOX) injection  55 mg Subcutaneous Q24H  . famotidine  20 mg Oral BID  . gabapentin  300 mg Oral QHS  . insulin aspart  0-15 Units Subcutaneous TID WC  . insulin aspart  0-5 Units Subcutaneous QHS  . insulin glargine  10 Units Subcutaneous QHS  . ipratropium-albuterol  3 mL Nebulization BID  . isosorbide dinitrate  60 mg Oral BID  . lisinopril  10 mg Oral Daily  . senna  1 tablet Oral QHS  . vancomycin  1,750 mg Intravenous Q12H   Continuous Infusions:    Principal Problem:   SOB (shortness of breath) Active Problems:   Diabetes mellitus type 2, uncontrolled, with cardiovascular complications   Tobacco abuse   Cocaine abuse   CAD (coronary artery disease), native coronary artery   Generalized anxiety disorder   Type 2 diabetes mellitus   Renal mass, right   HCAP (healthcare-associated pneumonia)   Hyponatremia   Sepsis   Salmonella typhosa    Time spent: 30 minutes.     [facility] [facility] Pager [phone]. If 7PM-7AM, please contact night-coverage at [email or web address], password [facility] 12/30/2013, 1:53 PM  LOS: 3 days

## 2013-12-31 DIAGNOSIS — E1165 Type 2 diabetes mellitus with hyperglycemia: Secondary | ICD-10-CM

## 2013-12-31 DIAGNOSIS — N289 Disorder of kidney and ureter, unspecified: Secondary | ICD-10-CM

## 2013-12-31 DIAGNOSIS — J189 Pneumonia, unspecified organism: Secondary | ICD-10-CM

## 2013-12-31 DIAGNOSIS — IMO0002 Reserved for concepts with insufficient information to code with codable children: Secondary | ICD-10-CM

## 2013-12-31 DIAGNOSIS — I2510 Atherosclerotic heart disease of native coronary artery without angina pectoris: Secondary | ICD-10-CM

## 2013-12-31 DIAGNOSIS — E118 Type 2 diabetes mellitus with unspecified complications: Secondary | ICD-10-CM

## 2013-12-31 LAB — GLUCOSE, CAPILLARY: Glucose-Capillary: 147 mg/dL — ABNORMAL HIGH (ref 70–99)

## 2013-12-31 MED ORDER — LEVOFLOXACIN 750 MG PO TABS: 750.0000 mg | ORAL_TABLET | Freq: Every day | ORAL | Status: DC

## 2013-12-31 MED ORDER — GUAIFENESIN-DM 100-10 MG/5ML PO SYRP: 5.0000 mL | ORAL_SOLUTION | ORAL | Status: DC | PRN

## 2013-12-31 MED ORDER — IPRATROPIUM-ALBUTEROL 0.5-2.5 (3) MG/3ML IN SOLN: 3.0000 mL | Freq: Four times a day (QID) | RESPIRATORY_TRACT | Status: DC | PRN

## 2013-12-31 MED ORDER — BENZONATATE 100 MG PO CAPS: 100.0000 mg | ORAL_CAPSULE | Freq: Three times a day (TID) | ORAL | Status: DC

## 2013-12-31 NOTE — Progress Notes (Signed)
Patient ambulates in hallway sats 91% on Room Air.

## 2013-12-31 NOTE — Progress Notes (Signed)
Patient being d/c home with prescriptions. IV cath removed and intact. No pain/swelling at site. Mother at bedside awaiting for discharge. No c/o pain at this time.

## 2014-01-01 LAB — CULTURE, BLOOD (ROUTINE X 2)
CULTURE: NO GROWTH
Culture: NO GROWTH

## 2014-01-01 NOTE — Discharge Summary (Addendum)
Physician Discharge Summary  [name]:[id number] DOB: 1956-06-17 DOA: 12/27/2013  PCP: Jacqualine Mau, PA-C  Admit date: 12/27/2013 Discharge date: 12/31/2013  Time spent: 30 minutes  Recommendations for Outpatient Follow-up:  1. Follow u pwith Right renal mass as outpatient with PCP 2. Follow up with TSH in 4 weeks.  3. Follow up with PCP the elevated ferritin.  4. Follow up with PCP in 2 weeks.  5. Please obtain CXR in 1 one week to evaluate for complete resolution.   Discharge Diagnoses:  Principal Problem:   SOB (shortness of breath) Active Problems:   Diabetes mellitus type 2, uncontrolled, with cardiovascular complications   Tobacco abuse   Cocaine abuse   CAD (coronary artery disease), native coronary artery   Generalized anxiety disorder   Type 2 diabetes mellitus   Renal mass, right   HCAP (healthcare-associated pneumonia)   Hyponatremia   Sepsis   Salmonella typhosa   Discharge Condition: improved  Diet recommendation:  Carb modified.   Filed Weights   12/27/13 1137  Weight: 108.41 kg (239 lb)    History of present illness:  [name] is a 58 y.o. male with a history of coronary artery disease with stenting in the past, diabetes mellitus with diabetic neuropathy, hypertension, and anxiety. He was recently hospitalized at [facility] from 12/18/2013 through 12/23/2013 for evaluation of chest pain and abdominal pain. He underwent a cardiac catheterization on 12/20/13. The results revealed double vessel CAD with patent stent mid LAD, moderate nonobstructive disease in the circumflex and RCA, and normal LV systolic function. He was also diagnosed with acute cholecystitis. He underwent a laparoscopic cholecystectomy on 12/22/13. Following his discharge, he says that he has not been feeling well. Specifically, he has had shortness of breath, subjective fever and chills, and a nonproductive cough. He has also had nausea, but without vomiting and a poor  appetite.In the emergency department, he is febrile with a temperature 102.1. He is mildly tachycardic with a heart rate ranging from 91-117. He is oxygenating between 89 and 93% on nasal cannula oxygen. His white blood cell count is 13.8. His serum sodium is 128. His glucose is 188. His troponin I is normal. His lactic acid is normal at 0.9. His chest x-ray reveals right upper lobe pneumonia. He is being admitted for further evaluation and management.  he was started on IV antibiotics and repeat CXR show slight improvement. He was discharged on oral levaquin. He was walked in the hallway without oxygen and his sats are greater than 93%    Hospital Course:  1. HCAP: - ADMITTED to telemetry and started on vancomycin and cefepime.  Blood cultures negative so far. Repeat CXR shows improvement in the pneumonia. He completed 5 days of IV antibiotics and he looks clinically much better and he was discharged on 5 more days of oral antibiotics.  2. Diabetes Mellitus:  CBG (last 3)   Recent Labs   12/29/13 2142  12/30/13 0710  12/30/13 1216   GLUCAP  137*  118*  180*    hgba1c is 7.3   3. Nausea and vomitng: resolved and advanced diet to solids today.   4. Incidental Right renal mass:  Outpatient follow up with surgery.  Hyponatremia: improving, possibly from dehydration.  Abnormal TSH:  Free T3 & T4 ordered and free t4 normal, and free t3 mildly low.  Recommend checking TSH in 4 weeks.  Anemia:  - mild drop. Normocytic. Stool for occult negative. Anemia panel shows  elevated ferritin.  - outpatient follow up with for elevated ferritin.      Procedures:  none  Consultations:  none  Discharge Exam: Filed Vitals:   12/31/13 0410  BP: 124/64  Pulse: 73  Temp: 97.4 F (36.3 C)  Resp: 18    General: alert afebrile comfortable Cardiovascular: s1s2 Respiratory: ctab  Discharge Instructions You were cared for by a hospitalist during your hospital stay. If you have any questions  about your discharge medications or the care you received while you were in the hospital after you are discharged, you can call the unit and asked to speak with the hospitalist on call if the hospitalist that took care of you is not available. Once you are discharged, your primary care physician will handle any further medical issues. Please note that NO REFILLS for any discharge medications will be authorized once you are discharged, as it is imperative that you return to your primary care physician (or establish a relationship with a primary care physician if you do not have one) for your aftercare needs so that they can reassess your need for medications and monitor your lab values.  Discharge Instructions   DME Nebulizer machine    Complete by:  As directed      Diet - low sodium heart healthy    Complete by:  As directed      Discharge instructions    Complete by:  As directed   Follow up with CXR in one week for resolution of the pneumonia.  Follow up with pcp IN ONE WEEK.            Medication List    STOP taking these medications       acetaminophen 500 MG tablet  Commonly known as:  TYLENOL      TAKE these medications       aspirin 81 MG chewable tablet  Chew 1 tablet (81 mg total) by mouth daily.     benzonatate 100 MG capsule  Commonly known as:  TESSALON  Take 1 capsule (100 mg total) by mouth 3 (three) times daily.     clopidogrel 75 MG tablet  Commonly known as:  PLAVIX  Take 1 tablet (75 mg total) by mouth daily with breakfast.     diltiazem 240 MG 24 hr capsule  Commonly known as:  CARDIZEM CD  Take 1 capsule (240 mg total) by mouth daily.     fish oil-omega-3 fatty acids 1000 MG capsule  Take 1 g by mouth 4 (four) times daily.     gabapentin 100 MG capsule  Commonly known as:  NEURONTIN  Take 300 mg by mouth at bedtime.     glipiZIDE 5 MG tablet  Commonly known as:  GLUCOTROL  Take 2.5 mg by mouth 2 (two) times daily before a meal.      guaiFENesin-dextromethorphan 100-10 MG/5ML syrup  Commonly known as:  ROBITUSSIN DM  Take 5 mLs by mouth every 4 (four) hours as needed for cough.     ipratropium-albuterol 0.5-2.5 (3) MG/3ML Soln  Commonly known as:  DUONEB  Take 3 mLs by nebulization every 6 (six) hours as needed.     isosorbide dinitrate 30 MG tablet  Commonly known as:  ISORDIL  Take 60 mg by mouth 2 (two) times daily.     levofloxacin 750 MG tablet  Commonly known as:  LEVAQUIN  Take 1 tablet (750 mg total) by mouth daily.     lisinopril 10 MG tablet  Commonly  known as:  PRINIVIL,ZESTRIL  Take 1 tablet (10 mg total) by mouth daily.     nitroGLYCERIN 0.4 MG SL tablet  Commonly known as:  NITROSTAT  Place 1 tablet (0.4 mg total) under the tongue every 5 (five) minutes as needed for chest pain.     ranitidine 300 MG tablet  Commonly known as:  ZANTAC  Take 300 mg by mouth at bedtime.     rosuvastatin 20 MG tablet  Commonly known as:  CRESTOR  Take 20 mg by mouth at bedtime.     sitaGLIPtin 100 MG tablet  Commonly known as:  JANUVIA  Take 100 mg by mouth daily.       No Known Allergies    The results of significant diagnostics from this hospitalization (including imaging, microbiology, ancillary and laboratory) are listed below for reference.    Significant Diagnostic Studies: Dg Chest 1 View  12/27/2013   CLINICAL DATA:  Shortness of breath.  Severe abdominal pain.  EXAM: CHEST - 1 VIEW  COMPARISON:  12/18/2013  FINDINGS: The heart size and mediastinal contours are within normal limits. Airspace consolidation within the right upper lobe is identified. Scar versus platelike atelectasis is noted in the left base. The visualized skeletal structures are unremarkable.  IMPRESSION: 1. Right upper lobe pneumonia.   Electronically Signed   By: Kerby Moors M.D.   On: 12/27/2013 10:34   Dg Chest 2 View  12/30/2013   CLINICAL DATA:  Followup pneumonia  EXAM: CHEST  2 VIEW  COMPARISON:  None.  FINDINGS:  Cardiomediastinal silhouette is stable. Persistent infiltrate/ pneumonia in right upper lobe. Left lung is clear. Mild degenerative changes thoracic spine.  IMPRESSION: Persistent infiltrate/ pneumonia in right upper lobe. Follow-up to resolution is recommended.   Electronically Signed   By: Lahoma Crocker M.D.   On: 12/30/2013 09:10   Mr Abdomen W Wo Contrast  12/20/2013   CLINICAL DATA:  Possible gallbladder mass on prior ultrasound, right upper quadrant abdominal pain  EXAM: MRI ABDOMEN WITHOUT AND WITH CONTRAST  TECHNIQUE: Multiplanar multisequence MR imaging of the abdomen was performed both before and after the administration of intravenous contrast.  CONTRAST:  14mL MULTIHANCE GADOBENATE DIMEGLUMINE 529 MG/ML IV SOLN  COMPARISON:  Abdominal ultrasound 11/22/2013  FINDINGS: Curvilinear atelectasis is noted at the right lung base.  Dependent hypo intense probable stones or sludge noted within the gallbladder neck image 26. There is mild gallbladder wall thickening and surrounding pericholecystic edema. No intrahepatic ductal dilatation is identified. Common duct and pancreatic duct are normal in caliber.  4.2 cm lateral segment left hepatic lobe cyst incidentally noted image 12 series 3. 1.6 cm mildly T2 hyperintense posterior segment right hepatic lobe mass is identified demonstrating progressive nodular centripetal enhancement compatible with a hemangioma. Hepatic steatosis noted.  Bilateral renal cortical T2 hyperintense cysts are noted. 7 mm left upper pole renal cortical proteinaceous cyst incidentally noted.  In the inferior aspect of the right kidney, there is a complex solid/ cystic mass measuring 4.2 x 3.3 cm with mixed internal signal. Enhancement is noted within a 3 cm central solid component. When measured including the cystic and solid component, the right lower renal pole mass measures overall 8.8 x 7.8 x 6.7 cm. An adjacent exophytic right lower renal pole dominant cyst with internal debris or  possibly peripheral thin linear septation measures 8.3 cm maximally image 24 series 4. No abnormal enhancement is identified in the renal vein or IVC. Although not completely imaged on all sequences, no  evidence for adjacent colonic or perinephric invasion. No hydroureteronephrosis. No lymphadenopathy or ascites.  Adrenal glands, spleen, and pancreas appear normal.  IMPRESSION: Probable stone or sludge noted within the gallbladder neck, with mild gallbladder wall thickening and pericholecystic edema, suspicious for cholecystitis. No intrahepatic, common duct, or pancreatic ductal dilatation.  Highly suspicious cystic/solid right lower renal pole exophytic mass with imaging features highly suggestive of renal cell carcinoma or possibly cystic nephroma. Urology consultation and general surgery consultation recommended.  Right hepatic lobe hemangioma incidentally noted. Hepatic steatosis.  These results will be called to the ordering clinician or representative by the Radiologist Assistant, and communication documented in the PACS or zVision Dashboard.   Electronically Signed   By: Conchita Paris M.D.   On: 12/20/2013 10:42   US Abdomen Complete  12/27/2013   ADDENDUM REPORT: 12/27/2013 16:59  ADDENDUM: After the examination was performed, interpreted and marked red additional clinical history was provided indicating that the patient is recently status post cholecystectomy. This structure which was originally described as representing a gallbladder containing multiple stones likely reflects postsurgical change within the gallbladder fossa. This was discussed by telephone on 12/27/2013 at approximately 4 PM with Dr. Fredia Sorrow , who verbally acknowledged these results.   Electronically Signed   By: Kerby Moors M.D.   On: 12/27/2013 16:59   12/27/2013   CLINICAL DATA:  Abdominal pain.  Pancreatitis  EXAM: ULTRASOUND ABDOMEN COMPLETE  COMPARISON:  12/20/2013  FINDINGS: Gallbladder:  Multiple stones are noted  within the gallbladder. The gallbladder wall is thickened measuring 3.6 cm.  Common bile duct:  Diameter: Increase caliber of the common bile duct measuring 10.7 cm.  Liver:  Septated cyst within the left hepatic lobe measures 5 x 4.7 x 3.8 cm. Mild increased parenchymal echogenicity noted compatible with hepatic steatosis.  IVC:  No abnormality visualized.  Pancreas:  Visualized portion unremarkable.  Spleen:  Size and appearance within normal limits.  Right Kidney:  Length: 12.5 cm. Echogenicity within normal limits. Complex cyst is again noted measuring 9.3 x 9.4 x 8.5 cm.  Left Kidney:  Length: 12.6 cm. Echogenicity within normal limits. No mass or hydronephrosis visualized.  Abdominal aorta:  No aneurysm visualized.  Other findings:  None.  IMPRESSION: 1. Gallstones and gallbladder wall thickening concerning for cholecystitis. 2. Increase caliber of the common bile duct. This is new from recent MRI from 12/20/2013. Cannot rule out choledocholithiasis. 3. Complicated cyst within the inferior pole of the right kidney is again noted. This is suggestive of cystic renal cell carcinoma. 4. Similar appearance of septated cyst within the left hepatic lobe. 5. Hepatic steatosis.  Electronically Signed: By: Kerby Moors M.D. On: 12/27/2013 13:20   Dg Chest Port 1 View  12/18/2013   CLINICAL DATA:  Chest pain  EXAM: PORTABLE CHEST - 1 VIEW  COMPARISON:  March 08, 2013  FINDINGS: There is a small area of infiltrate in the right base. Elsewhere lungs are clear. Heart is mildly enlarged with normal pulmonary vascularity. No adenopathy. No bone lesions.  IMPRESSION: Right base infiltrate. Lungs elsewhere clear. Stable cardiac enlargement.   Electronically Signed   By: Lowella Grip M.D.   On: 12/18/2013 12:32    Microbiology: Recent Results (from the past 240 hour(s))  CULTURE, BLOOD (ROUTINE X 2)     Status: None   Collection Time    12/27/13 11:12 AM      Result Value Ref Range Status   Specimen  Description BLOOD RIGHT HAND   Final  Special Requests     Final   Value: BOTTLES DRAWN AEROBIC AND ANAEROBIC AEB=8CC ANA=4CC   Culture NO GROWTH 5 DAYS   Final   Report Status 01/01/2014 FINAL   Final  CULTURE, BLOOD (ROUTINE X 2)     Status: None   Collection Time    12/27/13 11:30 AM      Result Value Ref Range Status   Specimen Description BLOOD LEFT ANTECUBITAL   Final   Special Requests     Final   Value: BOTTLES DRAWN AEROBIC AND ANAEROBIC AEB=8CC ANA=6CC   Culture NO GROWTH 5 DAYS   Final   Report Status 01/01/2014 FINAL   Final     Labs: Basic Metabolic Panel:  Recent Labs Lab 12/27/13 0944 12/28/13 0536 12/29/13 0639 12/30/13 0512  NA 128* 131* 130* 131*  K 4.6 4.8 4.6 4.5  CL 88* 96 96 95*  CO2 22 20 21 22   GLUCOSE 188* 131* 181* 169*  BUN 16 16 15 13   CREATININE 1.29 1.03 1.00 0.96  CALCIUM 9.5 8.6 8.3* 8.6   Liver Function Tests:  Recent Labs Lab 12/27/13 0944 12/28/13 0536  AST 26 29  ALT 30 25  ALKPHOS 89 78  BILITOT 1.0 0.8  PROT 8.4* 6.9  ALBUMIN 3.2* 2.4*    Recent Labs Lab 12/27/13 0944  LIPASE 30   No results found for this basename: AMMONIA,  in the last 168 hours CBC:  Recent Labs Lab 12/27/13 0944 12/28/13 0536 12/29/13 0639  WBC 13.8* 10.9* 7.9  NEUTROABS 11.4*  --   --   HGB 15.4 13.9 11.5*  HCT 43.8 41.3 34.0*  MCV 80.8 81.9 81.3  PLT 196 186 187   Cardiac Enzymes:  Recent Labs Lab 12/27/13 0944 12/29/13 2350  TROPONINI <0.30 <0.30   BNP: BNP (last 3 results)  Recent Labs  03/09/13 0047 12/18/13 1158 12/27/13 0944  PROBNP 41.1 54.7 234.5*   CBG:  Recent Labs Lab 12/29/13 1624 12/29/13 2142 12/30/13 0710 12/30/13 1216 12/31/13 0714  GLUCAP 141* 137* 118* 180* 147*       Signed:  Joeangel Jeanpaul  [facility] 01/01/2014, 6:58 PM

## 2014-01-08 ENCOUNTER — Other Ambulatory Visit: Payer: Self-pay | Admitting: *Deleted

## 2014-01-08 MED ORDER — ISOSORBIDE DINITRATE 30 MG PO TABS: 60.0000 mg | ORAL_TABLET | Freq: Two times a day (BID) | ORAL | Status: DC

## 2014-01-09 ENCOUNTER — Telehealth: Payer: Self-pay | Admitting: Adult Health

## 2014-01-09 NOTE — Telephone Encounter (Signed)
Please see paper in refill bin / tgs  °

## 2014-01-15 ENCOUNTER — Telehealth: Payer: Self-pay | Admitting: *Deleted

## 2014-01-15 ENCOUNTER — Telehealth: Payer: Self-pay | Admitting: Adult Health

## 2014-01-15 MED ORDER — ISOSORBIDE DINITRATE 30 MG PO TABS: 60.0000 mg | ORAL_TABLET | Freq: Two times a day (BID) | ORAL | Status: DC

## 2014-01-15 NOTE — Telephone Encounter (Signed)
Patient has questions about his medication refills / tgs

## 2014-01-15 NOTE — Telephone Encounter (Signed)
Pt states that he has been out of isosorbide for two days. Wants to know if we can call in a RX to [facility] to get him through till he get the mail order/tmj

## 2014-01-15 NOTE — Telephone Encounter (Signed)
Pt wanted to know if we had called in Rx for isosorbide and I did call into [facility] this morning for 2 week supply per patient. [facility] will send medication next week

## 2014-01-15 NOTE — Telephone Encounter (Signed)
Pt stated that mail order meds have not came in and out of isosorbide for 2 days requested a 2 weeks dose from [facility]. Medication sent to pharmacy.

## 2014-01-16 ENCOUNTER — Ambulatory Visit (INDEPENDENT_AMBULATORY_CARE_PROVIDER_SITE_OTHER): Payer: Medicaid Other | Admitting: General Surgery

## 2014-01-16 ENCOUNTER — Encounter (INDEPENDENT_AMBULATORY_CARE_PROVIDER_SITE_OTHER): Payer: Self-pay | Admitting: General Surgery

## 2014-01-16 VITALS — BP 126/78 | HR 85 | Temp 97.9°F | Ht 69.0 in | Wt 232.0 lb

## 2014-01-16 DIAGNOSIS — K811 Chronic cholecystitis: Secondary | ICD-10-CM

## 2014-01-16 NOTE — Progress Notes (Signed)
BRAEDON SJOGREN September 11, 1955 [id number] 01/16/2014   Art Levan Troiani is a 57 y.o. male who had a laparoscopic cholecystectomy by Dr. Nedra Hai.  The pathology report confirmed chronic cholecystitis.  The patient reports that they are feeling well with normal bowel movements and good appetite.  The pre-operative symptoms of abdominal pain, nausea, and vomiting have resolved.    Physical examination - Incisions appear well-healed with no sign of infection or bleeding.   Abdomen - soft, non-tender  Impression:  s/p laparoscopic cholecystectomy  Plan:  He may resume a regular diet and full activity.  He may follow-up on a PRN basis.

## 2014-01-16 NOTE — Patient Instructions (Signed)
Follow up as needed

## 2014-01-17 NOTE — Telephone Encounter (Signed)
If switching to Imdur, would start 60 mg daily for now.

## 2014-01-17 NOTE — Telephone Encounter (Signed)
Pt is calling about isosorbide still not being called in. Pt is now out of medication

## 2014-01-17 NOTE — Telephone Encounter (Signed)
 [facility] does not carry isosorbide dinitrate only IMDUR. Pt cannot afford medication and would like to know if he could switch to the IMDUR 60 mg BID. Please advise

## 2014-01-17 NOTE — Telephone Encounter (Signed)
IMDUR 60 mg daily called into [facility]. Notified pt of med change to IMDUR 60 mg daily per [facility] so that medication would be affordable and that RX was called into [facility] [place]

## 2014-01-18 ENCOUNTER — Telehealth: Payer: Self-pay | Admitting: *Deleted

## 2014-01-18 NOTE — Telephone Encounter (Signed)
Spoke to pharmacist and pt thought he was supposed to take the imdur 2 times a day. Clarified that it was only 1 time a day per he is on mononitrate and not dinitrate. Pharmacist asked would I call and explain to pt. Spoke to pt and he thought he was taking the same medication but understood when I asked him to spell his medication from the bottle. He understood to only take isosorbide mononitrate 60 mg one time a day

## 2014-01-18 NOTE — Telephone Encounter (Signed)
PHARMACY NEEDS CLARIFICATION ON DOSE FOR ISOSORBIDE, PT DOESN'T THINK IT WAS CALLED IN CORRECT.

## 2014-01-29 ENCOUNTER — Other Ambulatory Visit: Payer: Self-pay | Admitting: Urology

## 2014-02-07 ENCOUNTER — Ambulatory Visit (INDEPENDENT_AMBULATORY_CARE_PROVIDER_SITE_OTHER): Payer: Medicaid Other | Admitting: Cardiology

## 2014-02-07 ENCOUNTER — Encounter: Payer: Self-pay | Admitting: Cardiology

## 2014-02-07 VITALS — BP 98/63 | HR 80 | Ht 72.0 in | Wt 238.0 lb

## 2014-02-07 DIAGNOSIS — E785 Hyperlipidemia, unspecified: Secondary | ICD-10-CM

## 2014-02-07 DIAGNOSIS — I10 Essential (primary) hypertension: Secondary | ICD-10-CM

## 2014-02-07 DIAGNOSIS — Z01810 Encounter for preprocedural cardiovascular examination: Secondary | ICD-10-CM

## 2014-02-07 DIAGNOSIS — I209 Angina pectoris, unspecified: Secondary | ICD-10-CM

## 2014-02-07 DIAGNOSIS — I2510 Atherosclerotic heart disease of native coronary artery without angina pectoris: Secondary | ICD-10-CM

## 2014-02-07 DIAGNOSIS — I25119 Atherosclerotic heart disease of native coronary artery with unspecified angina pectoris: Secondary | ICD-10-CM

## 2014-02-07 DIAGNOSIS — F1910 Other psychoactive substance abuse, uncomplicated: Secondary | ICD-10-CM

## 2014-02-07 NOTE — Assessment & Plan Note (Signed)
No change to current regimen. 

## 2014-02-07 NOTE — Assessment & Plan Note (Signed)
Patient scheduled for robiotic right nephrectomy in August 5 by Dr. Tresa Moore with [facility] secondary to large right renal mass. From a cardiac perspective, [name] is cleared to proceed at intermediate perioperative cardiovascular risk, and can stop his Plavix 7 days prior to date of operation. In fact, would not plan to resume Plavix postoperatively since he will be a year out from his DES to the LAD as of August. He should however go back on aspirin. Otherwise continue cardiac medical regimen. If concerns arise during his hospitalization, our cardiology service can be consulted.

## 2014-02-07 NOTE — Patient Instructions (Signed)
Your physician recommends that you schedule a follow-up appointment in: 3 months. You will receive a reminder letter in the mail in about 1-2 months reminding you to call and schedule your appointment. If you don't receive this letter, please contact our office. Your physician recommends that you continue on your current medications as directed. Please refer to the Current Medication list given to you today. When you stop your plavix 7 days prior to your surgery, you may come off of it completely. Please restart your aspirin after your surgery. Your surgical clearance notification will be sent to your surgeon.

## 2014-02-07 NOTE — Assessment & Plan Note (Signed)
Reinforced abstinence from tobacco and cocaine use.

## 2014-02-07 NOTE — Assessment & Plan Note (Signed)
History of DES to the LAD in August 2014, recent cardiac catheterization in June of this year showed stent patency with otherwise mild to moderate nonobstructive residual disease. Would continue medical therapy.

## 2014-02-07 NOTE — Assessment & Plan Note (Signed)
Continues on Crestor, recent LDL 67.

## 2014-02-07 NOTE — Progress Notes (Signed)
Clinical Summary Mr. Delfavero is a medically complex 58 y.o.male last seen by Ms. Lawrence NP in April of this year. He is referred to the office by Dr. Tresa Moore with [facility] for preoperative evaluation. Patient is planned to undergo robotic right nephrectomy on August 5, it is requested that he come off of Plavix 7 days prior to the procedure. He has a large right renal mass.  Records reviewed since my last encounter with the patient in 2014. Most recently he has undergone a laparoscopic cholecystectomy in June of this year due to acute cholecystitis. At that time MRI demonstrated findings concerning for renal cell carcinoma ior cystic nephroma. He was also seen by our cardiology service and underwent a followup cardiac catheterization detailed below. Of note, urine drug screen was again positive for cocaine.  Followup cardiac catheterization performed in June of this year demonstrated patent stent site within the mid LAD with otherwise mild to moderate nonobstructive residual disease in the circumflex and RCA distribution, LVEF 65%. He continues to report symptoms consistent with angina at times. We discussed his medications, he states that he has been taking them regularly. Also reinforced abstinence from substance abuse.  ECG from June showed normal sinus rhythm with low voltage, borderline inferior Q waves. Lipid panel from June showed cholesterol 128, triglycerides 152, HDL 31, LDL 67.  No Known Allergies  Current Outpatient Prescriptions  Medication Sig Dispense Refill  . aspirin 81 MG chewable tablet Chew 1 tablet (81 mg total) by mouth daily.  30 tablet  2  . citalopram (CELEXA) 20 MG tablet Take 20 mg by mouth daily.      . clopidogrel (PLAVIX) 75 MG tablet Take 1 tablet (75 mg total) by mouth daily with breakfast.  30 tablet  5  . diltiazem (CARDIZEM CD) 240 MG 24 hr capsule Take 1 capsule (240 mg total) by mouth daily.  30 capsule  2  . fish oil-omega-3 fatty acids  1000 MG capsule Take 1 g by mouth 4 (four) times daily.      [name] gabapentin (NEURONTIN) 100 MG capsule Take 300 mg by mouth at bedtime.       [name] glipiZIDE (GLUCOTROL) 5 MG tablet Take 2.5 mg by mouth 2 (two) times daily before a meal.      . ipratropium-albuterol (DUONEB) 0.5-2.5 (3) MG/3ML SOLN Take 3 mLs by nebulization every 6 (six) hours as needed.  360 mL  0  . isosorbide mononitrate (IMDUR) 60 MG 24 hr tablet Take 60 mg by mouth daily.      [name] lisinopril (PRINIVIL,ZESTRIL) 10 MG tablet Take 1 tablet (10 mg total) by mouth daily.  30 tablet  6  . nitroGLYCERIN (NITROSTAT) 0.4 MG SL tablet Place 1 tablet (0.4 mg total) under the tongue every 5 (five) minutes as needed for chest pain.  30 tablet  1  . ranitidine (ZANTAC) 300 MG tablet Take 300 mg by mouth at bedtime.      . rosuvastatin (CRESTOR) 20 MG tablet Take 20 mg by mouth at bedtime.      . sitaGLIPtin (JANUVIA) 100 MG tablet Take 100 mg by mouth daily.       No current facility-administered medications for this visit.    Past Medical History  Diagnosis Date  . Essential hypertension, benign   . TIA (transient ischemic attack) 1997    No residual neurological deficits.  . Hepatitis Late 1970s    Treated in the hospital, finished treatment course. No recurrence.  [name]  Noncompliance   . Coronary atherosclerosis of native coronary artery     a. 03/09/2013 Cath/PCI: LM nl, LAD: 50p, 72m (2.5x16 promus DES), LCX nl, OM1 min irregs, LPL/LPDA diff dzs, RCA nondom, mod diff dzs, EF 55%.  [name] COPD (chronic obstructive pulmonary disease)   . Hyperlipidemia   . History of pneumonia   . Type 2 diabetes mellitus 2011  . GERD (gastroesophageal reflux disease)   . Arthritis   . Substance abuse   . Cocaine abuse   . Renal mass, right   . Cholecystitis, acute 12/20/2013    S/p Lap chole 6/5    Past Surgical History  Procedure Laterality Date  . Appendectomy  1970's  . Cholecystectomy N/A 12/22/2013    Procedure: LAPAROSCOPIC CHOLECYSTECTOMY ;   Surgeon: [name], MD;  Location: [facility] OR;  Service: General;  Laterality: N/A;    Family History  Problem Relation Age of Onset  . Hypertension    . Diabetes    . Stroke    . Lung cancer Father   . Cancer Mother     Thyroid - living in her 80's.  . Cancer Maternal Grandmother     Breast  . Cancer Maternal Grandfather     Throat and stomach  . CAD Father   . CAD Brother     Social History Mr. Marchena reports that he has been smoking Cigarettes.  He has a 41 pack-year smoking history. He has never used smokeless tobacco. Mr. Dellarocco reports that he does not drink alcohol.  Review of Systems No palpitations or syncope. No claudication. Anxiety. Stable appetite. No reported bleeding problems. No abdominal pain. Other systems reviewed and negative except as outlined.  Physical Examination Filed Vitals:   02/07/14 0835  BP: 98/63  Pulse: 80   Filed Weights   02/07/14 0835  Weight: 238 lb (107.956 kg)    Overweight..Appears comfortable at rest.  HEENT: Conjunctiva and lids normal, oropharynx clear.  Neck: Supple, no elevated JVP or carotid bruits, no thyromegaly.  Lungs: Diminished but clear to auscultation, nonlabored breathing at rest.  Cardiac: Regular rate and rhythm, no S3, 1-6/1 systolic murmur, no pericardial rub.  Abdomen: Soft, nontender, bowel sounds present, no guarding or rebound.  Extremities: No pitting edema, distal pulses 2+.  Skin: Warm and dry.  Musculoskeletal: No kyphosis.  Neuropsychiatric: Alert and oriented x3, affect grossly appropriate.   Problem List and Plan   Preoperative cardiovascular examination Patient scheduled for robiotic right nephrectomy in August 5 by Dr. Tresa Moore with [facility] secondary to large right renal mass. From a cardiac perspective, Mr. Calico is cleared to proceed at intermediate perioperative cardiovascular risk, and can stop his Plavix 7 days prior to date of operation. In fact, would not plan to  resume Plavix postoperatively since he will be a year out from his DES to the LAD as of August. He should however go back on aspirin. Otherwise continue cardiac medical regimen. If concerns arise during his hospitalization, our cardiology service can be consulted.  Coronary atherosclerosis of native coronary artery History of DES to the LAD in August 2014, recent cardiac catheterization in June of this year showed stent patency with otherwise mild to moderate nonobstructive residual disease. Would continue medical therapy.  Essential hypertension, benign No change to current regimen.  Substance abuse Reinforced abstinence from tobacco and cocaine use.  Hyperlipidemia Continues on Crestor, recent LDL 67.    Satira Sark, M.D., F.A.C.C.

## 2014-02-13 ENCOUNTER — Ambulatory Visit: Payer: Self-pay | Admitting: Adult Health

## 2014-02-13 ENCOUNTER — Encounter (HOSPITAL_COMMUNITY): Payer: Self-pay | Admitting: Pharmacy Technician

## 2014-02-14 ENCOUNTER — Ambulatory Visit (HOSPITAL_COMMUNITY)
Admission: RE | Admit: 2014-02-14 | Discharge: 2014-02-14 | Disposition: A | Discharge status: Home / Self Care | Payer: Medicaid Other | Source: Ambulatory Visit | Attending: Anesthesiology | Admitting: Anesthesiology

## 2014-02-14 ENCOUNTER — Encounter (HOSPITAL_COMMUNITY): Payer: Self-pay

## 2014-02-14 ENCOUNTER — Encounter (HOSPITAL_COMMUNITY)
Admission: RE | Admit: 2014-02-14 | Discharge: 2014-02-14 | Disposition: A | Discharge status: Home / Self Care | Payer: Medicaid Other | Source: Ambulatory Visit | Attending: Urology | Admitting: Urology

## 2014-02-14 DIAGNOSIS — Z01818 Encounter for other preprocedural examination: Secondary | ICD-10-CM | POA: Insufficient documentation

## 2014-02-14 DIAGNOSIS — Z01812 Encounter for preprocedural laboratory examination: Secondary | ICD-10-CM | POA: Insufficient documentation

## 2014-02-14 DIAGNOSIS — R05 Cough: Secondary | ICD-10-CM | POA: Diagnosis not present

## 2014-02-14 DIAGNOSIS — R059 Cough, unspecified: Secondary | ICD-10-CM | POA: Diagnosis not present

## 2014-02-14 HISTORY — DX: Major depressive disorder, single episode, unspecified: F32.9

## 2014-02-14 HISTORY — DX: Depression, unspecified: F32.A

## 2014-02-14 HISTORY — DX: Anxiety disorder, unspecified: F41.9

## 2014-02-14 LAB — CBC
HCT: 40 % (ref 39.0–52.0)
HEMOGLOBIN: 13.1 g/dL (ref 13.0–17.0)
MCH: 27.6 pg (ref 26.0–34.0)
MCHC: 32.8 g/dL (ref 30.0–36.0)
MCV: 84.4 fL (ref 78.0–100.0)
Platelets: 253 10*3/uL (ref 150–400)
RBC: 4.74 MIL/uL (ref 4.22–5.81)
RDW: 15.6 % — ABNORMAL HIGH (ref 11.5–15.5)
WBC: 11.2 10*3/uL — ABNORMAL HIGH (ref 4.0–10.5)

## 2014-02-14 LAB — COMPREHENSIVE METABOLIC PANEL
ALBUMIN: 3.4 g/dL — AB (ref 3.5–5.2)
ALK PHOS: 83 U/L (ref 39–117)
ALT: 12 U/L (ref 0–53)
AST: 12 U/L (ref 0–37)
Anion gap: 15 (ref 5–15)
BUN: 10 mg/dL (ref 6–23)
CO2: 21 mEq/L (ref 19–32)
Calcium: 9.4 mg/dL (ref 8.4–10.5)
Chloride: 100 mEq/L (ref 96–112)
Creatinine, Ser: 1 mg/dL (ref 0.50–1.35)
GFR calc non Af Amer: 81 mL/min — ABNORMAL LOW (ref >90)
Glucose, Bld: 162 mg/dL — ABNORMAL HIGH (ref 70–99)
POTASSIUM: 4.2 meq/L (ref 3.7–5.3)
SODIUM: 136 meq/L — AB (ref 137–147)
TOTAL PROTEIN: 7.6 g/dL (ref 6.0–8.3)
Total Bilirubin: 0.3 mg/dL (ref 0.3–1.2)

## 2014-02-14 LAB — ABO/RH: ABO/RH(D): B POS

## 2014-02-14 NOTE — Patient Instructions (Addendum)
20 ESIAS MORY  02/14/2014   Your procedure is scheduled on:   02-21-2014 Wednesday at [place] through [facility] Entrance and follow signs to [place]. Arrive at  0630    AM.  Call this number if you have problems the morning of surgery: [phone]  Or Presurgical Testing [phone](Melville Engen) For Living Will and/or Health Care Power Attorney Forms: please provide copy for your medical record,may bring AM of surgery(Forms should be already notarized -we do not provide this service).(02-14-14 Yes/ No information preferred today).  Remember: Follow any bowel prep instructions per MD office.    Do not eat food:After Midnight.    Take these medicines the morning of surgery with A SIP OF WATER: Citalopram. Cardizem. Fluoxteine. Isosorbide. Ranitidine. Do not take any Diabetic meds AM of. Use Plavix, Aspirin as MD directed.   Do not wear jewelry, make-up or nail polish.  Do not wear lotions, powders, or perfumes. You may wear deodorant.  Do not shave 48 hours(2 days) prior to first CHG shower(legs and under arms).(Shaving face and neck okay.)  Do not bring valuables to the hospital.(Hospital is not responsible for lost valuables).  Contacts, dentures or removable bridgework, body piercing, hair pins may not be worn into surgery.  Leave suitcase in the car. After surgery it may be brought to your room.  For patients admitted to the hospital, checkout time is 11:00 AM the day of discharge.(Restricted visitors-Any Persons displaying flu-like symptoms or illness).    Patients discharged the day of surgery will not be allowed to drive home. Must have responsible person with you x 24 hours once discharged.  Name and phone number of your driver: brother Coren Sagan -[phone] h  Special Instructions: CHG(Chlorhedine 4%-"Hibiclens","Betasept","Aplicare") Shower Use Special Wash: see special instructions.(avoid face and genitals)   Please read over the following fact  sheets that you were given: MRSA Information, Blood Transfusion fact sheet, Incentive Spirometry Instruction.  Remember : Type/Screen "Blue armbands" - may not be removed once applied(would result in being retested AM of surgery, if removed).     ________________________    [facility] - Preparing for Surgery Before surgery, you can play an important role.  Because skin is not sterile, your skin needs to be as free of germs as possible.  You can reduce the number of germs on your skin by washing with CHG (chlorahexidine gluconate) soap before surgery.  CHG is an antiseptic cleaner which kills germs and bonds with the skin to continue killing germs even after washing. Please DO NOT use if you have an allergy to CHG or antibacterial soaps.  If your skin becomes reddened/irritated stop using the CHG and inform your nurse when you arrive at Short Stay. Do not shave (including legs and underarms) for at least 48 hours prior to the first CHG shower.  You may shave your face/neck. Please follow these instructions carefully:  1.  Shower with CHG Soap the night before surgery and the  morning of Surgery.  2.  If you choose to wash your hair, wash your hair first as usual with your  normal  shampoo.  3.  After you shampoo, rinse your hair and body thoroughly to remove the  shampoo.                           4.  Use CHG as you would any other liquid soap.  You can apply chg directly  to the skin and wash                       Gently with a scrungie or clean washcloth.  5.  Apply the CHG Soap to your body ONLY FROM THE NECK DOWN.   Do not use on face/ open                           Wound or open sores. Avoid contact with eyes, ears mouth and genitals (private parts).                       Wash face,  Genitals (private parts) with your normal soap.             6.  Wash thoroughly, paying special attention to the area where your surgery  will be performed.  7.  Thoroughly rinse your body with warm water from  the neck down.  8.  DO NOT shower/wash with your normal soap after using and rinsing off  the CHG Soap.                9.  Pat yourself dry with a clean towel.            10.  Wear clean pajamas.            11.  Place clean sheets on your bed the night of your first shower and do not  sleep with pets. Day of Surgery : Do not apply any lotions/deodorants the morning of surgery.  Please wear clean clothes to the hospital/surgery center.  FAILURE TO FOLLOW THESE INSTRUCTIONS MAY RESULT IN THE CANCELLATION OF YOUR SURGERY PATIENT SIGNATURE_________________________________  NURSE SIGNATURE__________________________________  ________________________________________________________________________

## 2014-02-14 NOTE — Pre-Procedure Instructions (Signed)
02-14-14 Ekg 12-30-13 Epic. CXR done today.

## 2014-02-21 ENCOUNTER — Encounter (HOSPITAL_COMMUNITY): Payer: Medicaid Other | Admitting: Registered Nurse

## 2014-02-21 ENCOUNTER — Encounter (HOSPITAL_COMMUNITY)
Admission: RE | Disposition: A | Discharge status: Home / Self Care | Payer: Self-pay | Source: Ambulatory Visit | Attending: Urology

## 2014-02-21 ENCOUNTER — Encounter (HOSPITAL_COMMUNITY): Payer: Self-pay

## 2014-02-21 ENCOUNTER — Inpatient Hospital Stay (HOSPITAL_COMMUNITY)
Admission: RE | Admit: 2014-02-21 | Discharge: 2014-02-27 | DRG: 656 | Disposition: A | Discharge status: Home / Self Care | Payer: Medicaid Other | Source: Ambulatory Visit | Attending: Family Medicine | Admitting: Family Medicine

## 2014-02-21 ENCOUNTER — Inpatient Hospital Stay (HOSPITAL_COMMUNITY): Payer: Medicaid Other | Admitting: Registered Nurse

## 2014-02-21 DIAGNOSIS — Z79899 Other long term (current) drug therapy: Secondary | ICD-10-CM | POA: Diagnosis not present

## 2014-02-21 DIAGNOSIS — Z87442 Personal history of urinary calculi: Secondary | ICD-10-CM

## 2014-02-21 DIAGNOSIS — E1142 Type 2 diabetes mellitus with diabetic polyneuropathy: Secondary | ICD-10-CM | POA: Diagnosis present

## 2014-02-21 DIAGNOSIS — R143 Flatulence: Secondary | ICD-10-CM

## 2014-02-21 DIAGNOSIS — F17200 Nicotine dependence, unspecified, uncomplicated: Secondary | ICD-10-CM | POA: Diagnosis present

## 2014-02-21 DIAGNOSIS — Z8673 Personal history of transient ischemic attack (TIA), and cerebral infarction without residual deficits: Secondary | ICD-10-CM

## 2014-02-21 DIAGNOSIS — J189 Pneumonia, unspecified organism: Secondary | ICD-10-CM | POA: Diagnosis not present

## 2014-02-21 DIAGNOSIS — Z9861 Coronary angioplasty status: Secondary | ICD-10-CM

## 2014-02-21 DIAGNOSIS — Z7982 Long term (current) use of aspirin: Secondary | ICD-10-CM | POA: Diagnosis not present

## 2014-02-21 DIAGNOSIS — M129 Arthropathy, unspecified: Secondary | ICD-10-CM | POA: Diagnosis present

## 2014-02-21 DIAGNOSIS — K219 Gastro-esophageal reflux disease without esophagitis: Secondary | ICD-10-CM | POA: Diagnosis present

## 2014-02-21 DIAGNOSIS — R141 Gas pain: Secondary | ICD-10-CM | POA: Diagnosis not present

## 2014-02-21 DIAGNOSIS — J449 Chronic obstructive pulmonary disease, unspecified: Secondary | ICD-10-CM | POA: Diagnosis present

## 2014-02-21 DIAGNOSIS — Z800 Family history of malignant neoplasm of digestive organs: Secondary | ICD-10-CM

## 2014-02-21 DIAGNOSIS — R5381 Other malaise: Secondary | ICD-10-CM | POA: Diagnosis not present

## 2014-02-21 DIAGNOSIS — D49519 Neoplasm of unspecified behavior of unspecified kidney: Secondary | ICD-10-CM | POA: Diagnosis present

## 2014-02-21 DIAGNOSIS — Z8701 Personal history of pneumonia (recurrent): Secondary | ICD-10-CM

## 2014-02-21 DIAGNOSIS — J9601 Acute respiratory failure with hypoxia: Secondary | ICD-10-CM | POA: Diagnosis present

## 2014-02-21 DIAGNOSIS — C649 Malignant neoplasm of unspecified kidney, except renal pelvis: Principal | ICD-10-CM | POA: Diagnosis present

## 2014-02-21 DIAGNOSIS — N2889 Other specified disorders of kidney and ureter: Secondary | ICD-10-CM

## 2014-02-21 DIAGNOSIS — Z91199 Patient's noncompliance with other medical treatment and regimen due to unspecified reason: Secondary | ICD-10-CM | POA: Diagnosis not present

## 2014-02-21 DIAGNOSIS — Z8249 Family history of ischemic heart disease and other diseases of the circulatory system: Secondary | ICD-10-CM

## 2014-02-21 DIAGNOSIS — E1149 Type 2 diabetes mellitus with other diabetic neurological complication: Secondary | ICD-10-CM | POA: Diagnosis present

## 2014-02-21 DIAGNOSIS — E118 Type 2 diabetes mellitus with unspecified complications: Secondary | ICD-10-CM

## 2014-02-21 DIAGNOSIS — Z9089 Acquired absence of other organs: Secondary | ICD-10-CM

## 2014-02-21 DIAGNOSIS — R1011 Right upper quadrant pain: Secondary | ICD-10-CM | POA: Diagnosis not present

## 2014-02-21 DIAGNOSIS — J9600 Acute respiratory failure, unspecified whether with hypoxia or hypercapnia: Secondary | ICD-10-CM | POA: Diagnosis not present

## 2014-02-21 DIAGNOSIS — Z801 Family history of malignant neoplasm of trachea, bronchus and lung: Secondary | ICD-10-CM

## 2014-02-21 DIAGNOSIS — G929 Unspecified toxic encephalopathy: Secondary | ICD-10-CM | POA: Diagnosis not present

## 2014-02-21 DIAGNOSIS — F411 Generalized anxiety disorder: Secondary | ICD-10-CM | POA: Diagnosis present

## 2014-02-21 DIAGNOSIS — Z833 Family history of diabetes mellitus: Secondary | ICD-10-CM

## 2014-02-21 DIAGNOSIS — T380X5A Adverse effect of glucocorticoids and synthetic analogues, initial encounter: Secondary | ICD-10-CM | POA: Diagnosis present

## 2014-02-21 DIAGNOSIS — Z9119 Patient's noncompliance with other medical treatment and regimen: Secondary | ICD-10-CM

## 2014-02-21 DIAGNOSIS — N184 Chronic kidney disease, stage 4 (severe): Secondary | ICD-10-CM | POA: Diagnosis present

## 2014-02-21 DIAGNOSIS — F329 Major depressive disorder, single episode, unspecified: Secondary | ICD-10-CM | POA: Diagnosis present

## 2014-02-21 DIAGNOSIS — I2510 Atherosclerotic heart disease of native coronary artery without angina pectoris: Secondary | ICD-10-CM | POA: Diagnosis present

## 2014-02-21 DIAGNOSIS — IMO0002 Reserved for concepts with insufficient information to code with codable children: Secondary | ICD-10-CM | POA: Diagnosis present

## 2014-02-21 DIAGNOSIS — R404 Transient alteration of awareness: Secondary | ICD-10-CM | POA: Diagnosis not present

## 2014-02-21 DIAGNOSIS — E8770 Fluid overload, unspecified: Secondary | ICD-10-CM | POA: Diagnosis present

## 2014-02-21 DIAGNOSIS — E1165 Type 2 diabetes mellitus with hyperglycemia: Secondary | ICD-10-CM

## 2014-02-21 DIAGNOSIS — E785 Hyperlipidemia, unspecified: Secondary | ICD-10-CM | POA: Diagnosis present

## 2014-02-21 DIAGNOSIS — Z7902 Long term (current) use of antithrombotics/antiplatelets: Secondary | ICD-10-CM

## 2014-02-21 DIAGNOSIS — G8918 Other acute postprocedural pain: Secondary | ICD-10-CM | POA: Diagnosis not present

## 2014-02-21 DIAGNOSIS — I10 Essential (primary) hypertension: Secondary | ICD-10-CM | POA: Diagnosis present

## 2014-02-21 DIAGNOSIS — F3289 Other specified depressive episodes: Secondary | ICD-10-CM | POA: Diagnosis present

## 2014-02-21 DIAGNOSIS — J4489 Other specified chronic obstructive pulmonary disease: Secondary | ICD-10-CM | POA: Diagnosis present

## 2014-02-21 DIAGNOSIS — I509 Heart failure, unspecified: Secondary | ICD-10-CM | POA: Diagnosis not present

## 2014-02-21 DIAGNOSIS — Z8619 Personal history of other infectious and parasitic diseases: Secondary | ICD-10-CM

## 2014-02-21 DIAGNOSIS — Z808 Family history of malignant neoplasm of other organs or systems: Secondary | ICD-10-CM | POA: Diagnosis not present

## 2014-02-21 DIAGNOSIS — Z823 Family history of stroke: Secondary | ICD-10-CM | POA: Diagnosis not present

## 2014-02-21 DIAGNOSIS — K660 Peritoneal adhesions (postprocedural) (postinfection): Secondary | ICD-10-CM | POA: Diagnosis present

## 2014-02-21 DIAGNOSIS — J9819 Other pulmonary collapse: Secondary | ICD-10-CM | POA: Diagnosis not present

## 2014-02-21 DIAGNOSIS — R5383 Other fatigue: Secondary | ICD-10-CM | POA: Diagnosis not present

## 2014-02-21 DIAGNOSIS — E873 Alkalosis: Secondary | ICD-10-CM | POA: Diagnosis not present

## 2014-02-21 DIAGNOSIS — Z803 Family history of malignant neoplasm of breast: Secondary | ICD-10-CM

## 2014-02-21 DIAGNOSIS — G92 Toxic encephalopathy: Secondary | ICD-10-CM | POA: Diagnosis not present

## 2014-02-21 DIAGNOSIS — E8779 Other fluid overload: Secondary | ICD-10-CM

## 2014-02-21 DIAGNOSIS — N289 Disorder of kidney and ureter, unspecified: Secondary | ICD-10-CM | POA: Diagnosis present

## 2014-02-21 DIAGNOSIS — R142 Eructation: Secondary | ICD-10-CM

## 2014-02-21 DIAGNOSIS — I129 Hypertensive chronic kidney disease with stage 1 through stage 4 chronic kidney disease, or unspecified chronic kidney disease: Secondary | ICD-10-CM | POA: Diagnosis present

## 2014-02-21 HISTORY — PX: LAPAROSCOPIC LYSIS OF ADHESIONS: SHX5905

## 2014-02-21 HISTORY — PX: ROBOT ASSISTED LAPAROSCOPIC NEPHRECTOMY: SHX5140

## 2014-02-21 LAB — GLUCOSE, CAPILLARY
GLUCOSE-CAPILLARY: 122 mg/dL — AB (ref 70–99)
Glucose-Capillary: 211 mg/dL — ABNORMAL HIGH (ref 70–99)
Glucose-Capillary: 146 mg/dL — ABNORMAL HIGH (ref 70–99)
Glucose-Capillary: 118 mg/dL — ABNORMAL HIGH (ref 70–99)

## 2014-02-21 LAB — RAPID URINE DRUG SCREEN, HOSP PERFORMED
Amphetamines: NOT DETECTED
BARBITURATES: NOT DETECTED
Benzodiazepines: POSITIVE — AB
COCAINE: NOT DETECTED
Opiates: NOT DETECTED
Tetrahydrocannabinol: NOT DETECTED

## 2014-02-21 LAB — HEMOGLOBIN AND HEMATOCRIT, BLOOD
HEMATOCRIT: 42.5 % (ref 39.0–52.0)
HEMOGLOBIN: 13.8 g/dL (ref 13.0–17.0)

## 2014-02-21 LAB — TYPE AND SCREEN
ABO/RH(D): B POS
ANTIBODY SCREEN: NEGATIVE

## 2014-02-21 SURGERY — ROBOTIC ASSISTED LAPAROSCOPIC NEPHRECTOMY
Anesthesia: General | Laterality: Right

## 2014-02-21 MED ORDER — ONDANSETRON HCL 4 MG/2ML IJ SOLN
INTRAMUSCULAR | Status: AC
  Filled 2014-02-21: qty 2

## 2014-02-21 MED ORDER — ROCURONIUM BROMIDE 100 MG/10ML IV SOLN
INTRAVENOUS | Status: DC | PRN
  Administered 2014-02-21: 50 mg via INTRAVENOUS
  Administered 2014-02-21: 10 mg via INTRAVENOUS
  Administered 2014-02-21: 20 mg via INTRAVENOUS
  Administered 2014-02-21: 10 mg via INTRAVENOUS

## 2014-02-21 MED ORDER — PROPOFOL 10 MG/ML IV BOLUS
INTRAVENOUS | Status: DC | PRN
  Administered 2014-02-21: 180 mg via INTRAVENOUS

## 2014-02-21 MED ORDER — BUPIVACAINE LIPOSOME 1.3 % IJ SUSP
20.0000 mL | Freq: Once | INTRAMUSCULAR | Status: DC
  Filled 2014-02-21: qty 20

## 2014-02-21 MED ORDER — PHENYLEPHRINE 40 MCG/ML (10ML) SYRINGE FOR IV PUSH (FOR BLOOD PRESSURE SUPPORT)
PREFILLED_SYRINGE | INTRAVENOUS | Status: AC
  Filled 2014-02-21: qty 10

## 2014-02-21 MED ORDER — NITROGLYCERIN 0.4 MG SL SUBL: 0.4000 mg | SUBLINGUAL_TABLET | SUBLINGUAL | Status: DC | PRN

## 2014-02-21 MED ORDER — INSULIN ASPART 100 UNIT/ML ~~LOC~~ SOLN
0.0000 [IU] | Freq: Three times a day (TID) | SUBCUTANEOUS | Status: DC
  Administered 2014-02-21: 2 [IU] via SUBCUTANEOUS

## 2014-02-21 MED ORDER — PROPOFOL 10 MG/ML IV BOLUS
INTRAVENOUS | Status: AC
  Filled 2014-02-21: qty 20

## 2014-02-21 MED ORDER — HYDROMORPHONE HCL PF 1 MG/ML IJ SOLN
0.2500 mg | INTRAMUSCULAR | Status: DC | PRN
Start: 2014-02-21 — End: 2014-02-21

## 2014-02-21 MED ORDER — HYDROMORPHONE HCL PF 1 MG/ML IJ SOLN
0.5000 mg | INTRAMUSCULAR | Status: DC | PRN
  Administered 2014-02-22: 0.5 mg via INTRAVENOUS
  Filled 2014-02-21: qty 1

## 2014-02-21 MED ORDER — GLYCOPYRROLATE 0.2 MG/ML IJ SOLN
INTRAMUSCULAR | Status: DC | PRN
  Administered 2014-02-21: .8 mg via INTRAVENOUS

## 2014-02-21 MED ORDER — ONDANSETRON HCL 4 MG/2ML IJ SOLN
INTRAMUSCULAR | Status: DC | PRN
  Administered 2014-02-21: 4 mg via INTRAVENOUS

## 2014-02-21 MED ORDER — DOCUSATE SODIUM 100 MG PO CAPS
100.0000 mg | ORAL_CAPSULE | Freq: Two times a day (BID) | ORAL | Status: DC
  Administered 2014-02-21 – 2014-02-27 (×10): 100 mg via ORAL
  Filled 2014-02-21 (×12): qty 1

## 2014-02-21 MED ORDER — LIDOCAINE HCL (CARDIAC) 20 MG/ML IV SOLN
INTRAVENOUS | Status: AC
  Filled 2014-02-21: qty 5

## 2014-02-21 MED ORDER — IPRATROPIUM-ALBUTEROL 0.5-2.5 (3) MG/3ML IN SOLN
3.0000 mL | Freq: Four times a day (QID) | RESPIRATORY_TRACT | Status: DC | PRN
  Filled 2014-02-21: qty 3

## 2014-02-21 MED ORDER — PROMETHAZINE HCL 25 MG/ML IJ SOLN: 6.2500 mg | INTRAMUSCULAR | Status: DC | PRN

## 2014-02-21 MED ORDER — ASPIRIN 81 MG PO CHEW
81.0000 mg | CHEWABLE_TABLET | Freq: Every day | ORAL | Status: DC
  Administered 2014-02-21 – 2014-02-27 (×7): 81 mg via ORAL
  Filled 2014-02-21 (×7): qty 1

## 2014-02-21 MED ORDER — HYDROCODONE-ACETAMINOPHEN 5-325 MG PO TABS: 1.0000 | ORAL_TABLET | Freq: Four times a day (QID) | ORAL | Status: DC | PRN

## 2014-02-21 MED ORDER — KETOROLAC TROMETHAMINE 30 MG/ML IJ SOLN
INTRAMUSCULAR | Status: AC
  Filled 2014-02-21: qty 1

## 2014-02-21 MED ORDER — CEFAZOLIN SODIUM-DEXTROSE 2-3 GM-% IV SOLR
INTRAVENOUS | Status: AC
  Filled 2014-02-21: qty 50

## 2014-02-21 MED ORDER — SENNA 8.6 MG PO TABS
1.0000 | ORAL_TABLET | Freq: Two times a day (BID) | ORAL | Status: DC
  Administered 2014-02-21 – 2014-02-27 (×12): 8.6 mg via ORAL
  Filled 2014-02-21 (×12): qty 1

## 2014-02-21 MED ORDER — ONDANSETRON HCL 4 MG/2ML IJ SOLN: 4.0000 mg | INTRAMUSCULAR | Status: DC | PRN

## 2014-02-21 MED ORDER — FENTANYL CITRATE 0.05 MG/ML IJ SOLN
INTRAMUSCULAR | Status: AC
  Filled 2014-02-21: qty 5

## 2014-02-21 MED ORDER — STERILE WATER FOR IRRIGATION IR SOLN
Status: DC | PRN
  Administered 2014-02-21: 1500 mL

## 2014-02-21 MED ORDER — INSULIN ASPART 100 UNIT/ML ~~LOC~~ SOLN
4.0000 [IU] | Freq: Three times a day (TID) | SUBCUTANEOUS | Status: DC
  Administered 2014-02-21: 4 [IU] via SUBCUTANEOUS

## 2014-02-21 MED ORDER — EPHEDRINE SULFATE 50 MG/ML IJ SOLN
INTRAMUSCULAR | Status: AC
  Filled 2014-02-21: qty 1

## 2014-02-21 MED ORDER — DILTIAZEM HCL ER COATED BEADS 240 MG PO CP24
240.0000 mg | ORAL_CAPSULE | Freq: Every morning | ORAL | Status: DC
  Administered 2014-02-23 – 2014-02-27 (×5): 240 mg via ORAL
  Filled 2014-02-21: qty 1
  Filled 2014-02-21 (×5): qty 2

## 2014-02-21 MED ORDER — ATROPINE SULFATE 0.4 MG/ML IJ SOLN
INTRAMUSCULAR | Status: AC
  Filled 2014-02-21: qty 2

## 2014-02-21 MED ORDER — SODIUM CHLORIDE 0.9 % IJ SOLN
INTRAMUSCULAR | Status: AC
  Filled 2014-02-21: qty 10

## 2014-02-21 MED ORDER — PHENYLEPHRINE HCL 10 MG/ML IJ SOLN
INTRAMUSCULAR | Status: DC | PRN
  Administered 2014-02-21: 40 ug via INTRAVENOUS

## 2014-02-21 MED ORDER — ATORVASTATIN CALCIUM 40 MG PO TABS
40.0000 mg | ORAL_TABLET | Freq: Every day | ORAL | Status: DC
Start: 2014-02-21 — End: 2014-02-27
  Administered 2014-02-21 – 2014-02-26 (×6): 40 mg via ORAL
  Filled 2014-02-21 (×7): qty 1

## 2014-02-21 MED ORDER — LACTATED RINGERS IR SOLN
Status: DC | PRN
  Administered 2014-02-21: 1000 mL

## 2014-02-21 MED ORDER — OXYCODONE HCL 5 MG PO TABS: 5.0000 mg | ORAL_TABLET | ORAL | Status: DC | PRN

## 2014-02-21 MED ORDER — ISOSORBIDE MONONITRATE ER 60 MG PO TB24
60.0000 mg | ORAL_TABLET | Freq: Every morning | ORAL | Status: DC
  Administered 2014-02-23 – 2014-02-27 (×5): 60 mg via ORAL
  Filled 2014-02-21 (×5): qty 1

## 2014-02-21 MED ORDER — MIDAZOLAM HCL 2 MG/2ML IJ SOLN
INTRAMUSCULAR | Status: AC
  Filled 2014-02-21: qty 2

## 2014-02-21 MED ORDER — FLUOXETINE HCL 10 MG PO CAPS
10.0000 mg | ORAL_CAPSULE | Freq: Every morning | ORAL | Status: DC
  Administered 2014-02-23 – 2014-02-27 (×5): 10 mg via ORAL
  Filled 2014-02-21 (×7): qty 1

## 2014-02-21 MED ORDER — MIDAZOLAM HCL 5 MG/5ML IJ SOLN
INTRAMUSCULAR | Status: DC | PRN
  Administered 2014-02-21: 2 mg via INTRAVENOUS

## 2014-02-21 MED ORDER — FENTANYL CITRATE 0.05 MG/ML IJ SOLN
INTRAMUSCULAR | Status: DC | PRN
  Administered 2014-02-21 (×5): 50 ug via INTRAVENOUS

## 2014-02-21 MED ORDER — CEFAZOLIN SODIUM-DEXTROSE 2-3 GM-% IV SOLR
2.0000 g | INTRAVENOUS | Status: AC
  Administered 2014-02-21: 2 g via INTRAVENOUS

## 2014-02-21 MED ORDER — HYDROMORPHONE HCL PF 2 MG/ML IJ SOLN
INTRAMUSCULAR | Status: AC
  Filled 2014-02-21: qty 1

## 2014-02-21 MED ORDER — KETOROLAC TROMETHAMINE 30 MG/ML IJ SOLN
15.0000 mg | Freq: Once | INTRAMUSCULAR | Status: AC | PRN
  Administered 2014-02-21: 30 mg via INTRAVENOUS

## 2014-02-21 MED ORDER — NEOSTIGMINE METHYLSULFATE 10 MG/10ML IV SOLN
INTRAVENOUS | Status: DC | PRN
  Administered 2014-02-21: 5 mg via INTRAVENOUS

## 2014-02-21 MED ORDER — BUPIVACAINE LIPOSOME 1.3 % IJ SUSP
INTRAMUSCULAR | Status: DC | PRN
  Administered 2014-02-21: 20 mL

## 2014-02-21 MED ORDER — LIDOCAINE HCL (CARDIAC) 20 MG/ML IV SOLN
INTRAVENOUS | Status: DC | PRN
  Administered 2014-02-21: 100 mg via INTRAVENOUS

## 2014-02-21 MED ORDER — LACTATED RINGERS IV SOLN
INTRAVENOUS | Status: DC | PRN
  Administered 2014-02-21: 08:00:00 via INTRAVENOUS

## 2014-02-21 MED ORDER — CITALOPRAM HYDROBROMIDE 40 MG PO TABS
40.0000 mg | ORAL_TABLET | Freq: Every morning | ORAL | Status: DC
  Administered 2014-02-22 – 2014-02-27 (×6): 40 mg via ORAL
  Filled 2014-02-21: qty 1
  Filled 2014-02-21 (×6): qty 2

## 2014-02-21 MED ORDER — ROCURONIUM BROMIDE 100 MG/10ML IV SOLN
INTRAVENOUS | Status: AC
  Filled 2014-02-21: qty 1

## 2014-02-21 MED ORDER — SODIUM CHLORIDE 0.9 % IV SOLN
INTRAVENOUS | Status: DC
  Administered 2014-02-21: 13:00:00 via INTRAVENOUS

## 2014-02-21 MED ORDER — GABAPENTIN 300 MG PO CAPS
300.0000 mg | ORAL_CAPSULE | Freq: Every day | ORAL | Status: DC
  Administered 2014-02-21 – 2014-02-26 (×6): 300 mg via ORAL
  Filled 2014-02-21 (×7): qty 1

## 2014-02-21 MED ORDER — ACETAMINOPHEN 500 MG PO TABS
1000.0000 mg | ORAL_TABLET | Freq: Three times a day (TID) | ORAL | Status: AC
  Administered 2014-02-21 – 2014-02-22 (×2): 1000 mg via ORAL
  Filled 2014-02-21 (×2): qty 2

## 2014-02-21 SURGICAL SUPPLY — 52 items
CHLORAPREP W/TINT 26ML (MISCELLANEOUS) ×3 IMPLANT
CLIP LIGATING HEM O LOK PURPLE (MISCELLANEOUS) ×6 IMPLANT
CLIP LIGATING HEMO LOK XL GOLD (MISCELLANEOUS) IMPLANT
CLIP LIGATING HEMO O LOK GREEN (MISCELLANEOUS) ×3 IMPLANT
CORDS BIPOLAR (ELECTRODE) ×3 IMPLANT
COVER SURGICAL LIGHT HANDLE (MISCELLANEOUS) ×3 IMPLANT
COVER TIP SHEARS 8 DVNC (MISCELLANEOUS) ×2 IMPLANT
COVER TIP SHEARS 8MM DA VINCI (MISCELLANEOUS) ×1
CUTTER ECHEON FLEX ENDO 45 340 (ENDOMECHANICALS) ×3 IMPLANT
DECANTER SPIKE VIAL GLASS SM (MISCELLANEOUS) ×3 IMPLANT
DERMABOND ADVANCED (GAUZE/BANDAGES/DRESSINGS) ×1
DERMABOND ADVANCED .7 DNX12 (GAUZE/BANDAGES/DRESSINGS) ×2 IMPLANT
DRAIN CHANNEL 15F RND FF 3/16 (WOUND CARE) IMPLANT
DRAPE INCISE IOBAN 66X45 STRL (DRAPES) ×3 IMPLANT
DRAPE LAPAROSCOPIC ABDOMINAL (DRAPES) ×3 IMPLANT
DRAPE LG THREE QUARTER DISP (DRAPES) ×6 IMPLANT
DRAPE TABLE BACK 44X90 PK DISP (DRAPES) ×3 IMPLANT
DRAPE WARM FLUID 44X44 (DRAPE) ×3 IMPLANT
ELECT REM PT RETURN 9FT ADLT (ELECTROSURGICAL) ×3
ELECTRODE REM PT RTRN 9FT ADLT (ELECTROSURGICAL) ×2 IMPLANT
EVACUATOR SILICONE 100CC (DRAIN) IMPLANT
GLOVE BIOGEL M STRL SZ7.5 (GLOVE) ×6 IMPLANT
GOWN STRL REUS W/TWL LRG LVL3 (GOWN DISPOSABLE) ×15 IMPLANT
KIT ACCESSORY DA VINCI DISP (KITS) ×1
KIT ACCESSORY DVNC DISP (KITS) ×2 IMPLANT
KIT BASIN OR (CUSTOM PROCEDURE TRAY) ×3 IMPLANT
PENCIL BUTTON HOLSTER BLD 10FT (ELECTRODE) ×3 IMPLANT
POSITIONER SURGICAL ARM (MISCELLANEOUS) ×3 IMPLANT
POUCH ENDO CATCH II 15MM (MISCELLANEOUS) IMPLANT
RELOAD WH ECHELON 45 (STAPLE) ×6 IMPLANT
SCISSORS LAP 5X35 DISP (ENDOMECHANICALS) ×3 IMPLANT
SET TUBE IRRIG SUCTION NO TIP (IRRIGATION / IRRIGATOR) ×3 IMPLANT
SOLUTION ANTI FOG 6CC (MISCELLANEOUS) ×3 IMPLANT
SOLUTION ELECTROLUBE (MISCELLANEOUS) ×3 IMPLANT
SPONGE LAP 18X18 X RAY DECT (DISPOSABLE) ×3 IMPLANT
SPONGE LAP 4X18 X RAY DECT (DISPOSABLE) IMPLANT
SUT ETHILON 3 0 PS 1 (SUTURE) IMPLANT
SUT MNCRL AB 4-0 PS2 18 (SUTURE) ×9 IMPLANT
SUT PDS AB 1 CT1 27 (SUTURE) ×9 IMPLANT
SUT VIC AB 2-0 SH 27 (SUTURE) ×3
SUT VIC AB 2-0 SH 27X BRD (SUTURE) ×6 IMPLANT
SUT VICRYL 0 UR6 27IN ABS (SUTURE) ×3 IMPLANT
SYR BULB IRRIGATION 50ML (SYRINGE) IMPLANT
TOWEL OR NON WOVEN STRL DISP B (DISPOSABLE) ×3 IMPLANT
TRAY FOLEY CATH 14FRSI W/METER (CATHETERS) IMPLANT
TRAY FOLEY CATH 16FRSI W/METER (SET/KITS/TRAYS/PACK) ×3 IMPLANT
TRAY LAP CHOLE (CUSTOM PROCEDURE TRAY) ×3 IMPLANT
TROCAR 12M 150ML BLUNT (TROCAR) ×3 IMPLANT
TROCAR ENDOPATH XCEL 12X100 BL (ENDOMECHANICALS) ×3 IMPLANT
TROCAR XCEL 12X100 BLDLESS (ENDOMECHANICALS) ×3 IMPLANT
TUBING INSUFFLATION 10FT LAP (TUBING) ×3 IMPLANT
WATER STERILE IRR 1500ML POUR (IV SOLUTION) ×3 IMPLANT

## 2014-02-21 NOTE — Brief Op Note (Signed)
02/21/2014  12:17 PM  PATIENT:  [name]  58 y.o. male  PRE-OPERATIVE DIAGNOSIS:  LARGE RIGHT RENAL MASS  POST-OPERATIVE DIAGNOSIS:  LARGE RIGHT RENAL MASS  PROCEDURE:  Procedure(s): ROBOTIC ASSISTED LAPAROSCOPIC RIGHT NEPHRECTOMY  (Right) LAPAROSCOPIC LYSIS OF ADHESIONS EXTINSIVE  SURGEON:  Surgeon(s) and Role:    * [name], MD - Primary  PHYSICIAN ASSISTANT:   ASSISTANTS: 1 - Felipa Furnace, PA, 2 - Charlton Haws MD   ANESTHESIA:   general  EBL:  Total I/O In: 1000 [I.V.:1000] Out: 100 [Urine:100]  BLOOD ADMINISTERED:none  DRAINS: foley to straight drain   LOCAL MEDICATIONS USED:  MARCAINE     SPECIMEN:  Source of Specimen:  Rt radical nephrectomy  DISPOSITION OF SPECIMEN:  PATHOLOGY  COUNTS:  YES  TOURNIQUET:  * No tourniquets in log *  DICTATION: .Other Dictation: Dictation Number [id number]  PLAN OF CARE: Admit to inpatient   PATIENT DISPOSITION:  PACU - hemodynamically stable.   Delay start of Pharmacological VTE agent (>24hrs) due to surgical blood loss or risk of bleeding: yes

## 2014-02-21 NOTE — Transfer of Care (Signed)
Immediate Anesthesia Transfer of Care Note  Patient: [name]  Procedure(s) Performed: Procedure(s): ROBOTIC ASSISTED LAPAROSCOPIC RIGHT NEPHRECTOMY  (Right) LAPAROSCOPIC LYSIS OF ADHESIONS EXTINSIVE  Patient Location: PACU  Anesthesia Type:General  Level of Consciousness: awake, alert , oriented and patient cooperative  Airway & Oxygen Therapy: Patient Spontanous Breathing and Patient connected to face mask oxygen  Post-op Assessment: Report given to PACU RN, Post -op Vital signs reviewed and stable and Patient moving all extremities  Post vital signs: Reviewed and stable  Complications: No apparent anesthesia complications

## 2014-02-21 NOTE — Anesthesia Preprocedure Evaluation (Addendum)
Anesthesia Evaluation  Patient identified by MRN, date of birth, ID band Patient awake    Reviewed: Allergy & Precautions, H&P , NPO status , Patient's Chart, lab work & pertinent test results  Airway Mallampati: II TM Distance: >3 FB Neck ROM: Full    Dental no notable dental hx.    Pulmonary Current Smoker,  breath sounds clear to auscultation  Pulmonary exam normal       Cardiovascular hypertension, + CAD and + Cardiac Stents Rhythm:Regular Rate:Normal  '13 ECHO: EF 65-70%, valves OK '14 cath: DES LAD, otherwise non-obstructive dz, EF 55%    Neuro/Psych negative neurological ROS  negative psych ROS   GI/Hepatic negative GI ROS, (+)     substance abuse  cocaine use,   Endo/Other  diabetes, Oral Hypoglycemic Agents  Renal/GU negative Renal ROS  negative genitourinary   Musculoskeletal negative musculoskeletal ROS (+)   Abdominal   Peds negative pediatric ROS (+)  Hematology negative hematology ROS (+)   Anesthesia Other Findings   Reproductive/Obstetrics negative OB ROS                         Anesthesia Physical Anesthesia Plan  ASA: III  Anesthesia Plan: General   Post-op Pain Management:    Induction: Intravenous  Airway Management Planned: Oral ETT  Additional Equipment:   Intra-op Plan:   Post-operative Plan: Extubation in OR  Informed Consent: I have reviewed the patients History and Physical, chart, labs and discussed the procedure including the risks, benefits and alternatives for the proposed anesthesia with the patient or authorized representative who has indicated his/her understanding and acceptance.   Dental advisory given  Plan Discussed with: CRNA and Surgeon  Anesthesia Plan Comments:         Anesthesia Quick Evaluation

## 2014-02-21 NOTE — Discharge Instructions (Signed)

## 2014-02-21 NOTE — Progress Notes (Signed)
Switched pt. To venturi mask to help sats and pt. Breathing. Pt. Tolerating venti mask well at this time.

## 2014-02-21 NOTE — Progress Notes (Signed)
Increased fio2 to try to lift sats.

## 2014-02-21 NOTE — H&P (Signed)
[name] is an 58 y.o. male.    Chief Complaint: Pre-Op Right Radical Nephrectomy  HPI:    1 - Large Right Renal Mass - 8cm cystic / solid enhanicng mass by MRI 12/2013 during eval for epigastric pain. 1 early branching artery (straddles vein), 1 vein renovascular anatomy. No adenopathy or chest lesions. Cr <1.   PMH sig for CAD/Stent (recent cath OK, follows Marthe Patch NP, on plavix ), DM2, COPD, Lap Chole (12/2013), h/o drug abuse, h/o medical non-compliance. .  Today "[name]" is seen to proceed with robotic tight radical nephrectomy. He has held plavix per cardiology recommendations.   Past Medical History  Diagnosis Date  . Essential hypertension, benign   . TIA (transient ischemic attack) 1997    No residual neurological deficits.  . Noncompliance   . COPD (chronic obstructive pulmonary disease)   . Hyperlipidemia   . History of pneumonia   . Type 2 diabetes mellitus 2011  . GERD (gastroesophageal reflux disease)   . Substance abuse   . Cocaine abuse     02-14-14 dicusses freely with medical team, prefers no discussions with family present.  . Renal mass, right     surgery planned 02-21-14  . Cholecystitis, acute 12/20/2013    S/p Lap chole 6/5  . Coronary atherosclerosis of native coronary artery     a. 03/09/2013 Cath/PCI: LM nl, LAD: 50p, 23m (2.5x16 promus DES), LCX nl, OM1 min irregs, LPL/LPDA diff dzs, RCA nondom, mod diff dzs, EF 55%.  . Anginal pain     has two small heart arteries with blockage.  . Shortness of breath     with exertion always.  . Depression   . Anxiety   . History of kidney stones     several times in the 90's  . Arthritis     arm -right and both legs  . Cancer     right kidney cancer dx. recent  . Hepatitis Late 1970s    ?Hep. C-Treated in the hospital, finished treatment course. No recurrence.    Past Surgical History  Procedure Laterality Date  . Appendectomy  1970's  . Cholecystectomy N/A 12/22/2013    Procedure: LAPAROSCOPIC  CHOLECYSTECTOMY ;  Surgeon: [name], MD;  Location: [facility];  Service: General;  Laterality: N/A;  . Coronary stent placement    . Cardiac catheterization      6'15 -cardiac cath" found 2 small vessels with some blockage - vessels too small for stents.    Family History  Problem Relation Age of Onset  . Hypertension    . Diabetes    . Stroke    . Lung cancer Father   . Cancer Mother     Thyroid - living in her 40's.  . Cancer Maternal Grandmother     Breast  . Cancer Maternal Grandfather     Throat and stomach  . CAD Father   . CAD Brother    Social History:  reports that he has been smoking Cigarettes.  He has a 20.5 pack-year smoking history. He has never used smokeless tobacco. He reports that he does not drink alcohol or use illicit drugs.  Allergies: No Known Allergies  Medications Prior to Admission  Medication Sig Dispense Refill  . aspirin 81 MG chewable tablet Chew 1 tablet (81 mg total) by mouth daily.  30 tablet  2  . citalopram (CELEXA) 40 MG tablet Take 40 mg by mouth every morning.      . clopidogrel (PLAVIX)  75 MG tablet Take 1 tablet (75 mg total) by mouth daily with breakfast.  30 tablet  5  . diltiazem (CARDIZEM CD) 240 MG 24 hr capsule Take 240 mg by mouth every morning.      . fish oil-omega-3 fatty acids 1000 MG capsule Take 1 g by mouth 4 (four) times daily.      [name] FLUoxetine (PROZAC) 10 MG capsule Take 10 mg by mouth every morning.      . gabapentin (NEURONTIN) 300 MG capsule Take 300 mg by mouth at bedtime.      [name] glipiZIDE (GLUCOTROL) 10 MG tablet Take 10 mg by mouth 2 (two) times daily.      [name] ipratropium-albuterol (DUONEB) 0.5-2.5 (3) MG/3ML SOLN Take 3 mLs by nebulization every 6 (six) hours as needed (Shortness of breath).      . isosorbide mononitrate (IMDUR) 60 MG 24 hr tablet Take 60 mg by mouth every morning.       [name] lisinopril (PRINIVIL,ZESTRIL) 5 MG tablet Take 5 mg by mouth 2 (two) times daily.      . nitroGLYCERIN (NITROSTAT) 0.4 MG  SL tablet Place 1 tablet (0.4 mg total) under the tongue every 5 (five) minutes as needed for chest pain.  30 tablet  1  . ranitidine (ZANTAC) 300 MG tablet Take 300 mg by mouth every morning.       . rosuvastatin (CRESTOR) 20 MG tablet Take 20 mg by mouth at bedtime.      . sitaGLIPtin (JANUVIA) 100 MG tablet Take 100 mg by mouth every morning.         Results for orders placed during the hospital encounter of 02/21/14 (from the past 48 hour(s))  GLUCOSE, CAPILLARY     Status: Abnormal   Collection Time    02/21/14  6:51 AM      Result Value Ref Range   Glucose-Capillary 122 (*) 70 - 99 mg/dL   Comment 1 Notify RN     No results found.  Review of Systems  Constitutional: Negative.  Negative for fever and chills.  HENT: Negative.   Eyes: Negative.   Respiratory: Negative.   Cardiovascular: Negative.   Gastrointestinal: Negative.   Genitourinary: Negative.   Musculoskeletal: Negative.   Skin: Negative.   Neurological: Negative.   Endo/Heme/Allergies: Negative.   Psychiatric/Behavioral: Negative.     Blood pressure 108/68, pulse 88, temperature 97.6 F (36.4 C), temperature source Oral, resp. rate 18, SpO2 96.00%. Physical Exam  Constitutional: He is oriented to person, place, and time. He appears well-developed and well-nourished.  HENT:  Head: Normocephalic.  Eyes: Pupils are equal, round, and reactive to light.  Neck: Normal range of motion. Neck supple.  Cardiovascular: Normal rate.   Respiratory: Effort normal.  GI: Soft.  Prior port sites scars w/o hernias  Genitourinary: Penis normal.  Musculoskeletal: Normal range of motion.  Neurological: He is alert and oriented to person, place, and time.  Skin: Skin is warm and dry.  Psychiatric: He has a normal mood and affect. His behavior is normal. Judgment and thought content normal.     Assessment/Plan   1 - Large Right Renal Mass - worrisome for T1b or more neoplasm. Given overall GFR excellent and mass >4cm,  recommend nephrectomy as most definitive means of management.    We redicussed the role of radical nephrectomy with the overall goal of complete surgical excision (negative margins) and better staging / diagnosis. We specifically rediscussed that with removal of the kidney there would be  an overall renal function decline with attendant risks of renal failure and need for dialysis in some cases, and need for kidney-friendly lifestyle post-op with excellent blood pressure and glycemic control. We then rediscussed surgical approaches including robotic and open techniques with robotic associated with a shorter convalescence. I showed the patient on their abdomen the approximately 4-6 incision (trocar) sites as well as presumed extraction sites with robotic approach as well as possible open incision sites. We specifically readdressed that there may be need to alter operative plans according to intraopertive findings including conversion to open procedure. We rediscussed specific peri-operative risks including bleeding, infection, deep vein thrombosis, pulmonary embolism, compartment syndrome, nuropathy / neuropraxia, heart attack, stroke, death, as well as long-term risks such as non-cure / need for additional therapy and need for imaging and lab based post-op surveillance protocols. We rediscussed typical hospital course of approximately 2 day hospitalization, need for peri-operative drains / catheters, and typical post-hospital course with return to most non-strenuous activities by 2 weeks and ability to return to most jobs and more strenuous activity such as exercise by 6 weeks.    After this lengthy and detail discussion, including answering all of the patient's questions to their satisfaction, they have chosen to proceed today as planned.   Marven Veley 02/21/2014, 7:00 AM

## 2014-02-21 NOTE — Anesthesia Postprocedure Evaluation (Signed)
  Anesthesia Post-op Note  Patient: [name]  Procedure(s) Performed: Procedure(s) (LRB): ROBOTIC ASSISTED LAPAROSCOPIC RIGHT NEPHRECTOMY  (Right) LAPAROSCOPIC LYSIS OF ADHESIONS EXTINSIVE  Patient Location: PACU  Anesthesia Type: General  Level of Consciousness: awake and alert   Airway and Oxygen Therapy: Patient Spontanous Breathing  Post-op Pain: mild  Post-op Assessment: Post-op Vital signs reviewed, Patient's Cardiovascular Status Stable, Respiratory Function Stable, Patent Airway and No signs of Nausea or vomiting  Last Vitals:  Filed Vitals:   02/21/14 1430  BP: 116/60  Pulse:   Temp: 36.7 C  Resp:     Post-op Vital Signs: stable   Complications: No apparent anesthesia complications

## 2014-02-22 ENCOUNTER — Inpatient Hospital Stay (HOSPITAL_COMMUNITY): Payer: Medicaid Other

## 2014-02-22 ENCOUNTER — Encounter (HOSPITAL_COMMUNITY): Payer: Self-pay | Admitting: Urology

## 2014-02-22 DIAGNOSIS — E118 Type 2 diabetes mellitus with unspecified complications: Secondary | ICD-10-CM

## 2014-02-22 DIAGNOSIS — N289 Disorder of kidney and ureter, unspecified: Secondary | ICD-10-CM

## 2014-02-22 DIAGNOSIS — E8770 Fluid overload, unspecified: Secondary | ICD-10-CM | POA: Diagnosis present

## 2014-02-22 DIAGNOSIS — IMO0002 Reserved for concepts with insufficient information to code with codable children: Secondary | ICD-10-CM

## 2014-02-22 DIAGNOSIS — J9601 Acute respiratory failure with hypoxia: Secondary | ICD-10-CM | POA: Diagnosis present

## 2014-02-22 DIAGNOSIS — E8779 Other fluid overload: Secondary | ICD-10-CM

## 2014-02-22 DIAGNOSIS — E1165 Type 2 diabetes mellitus with hyperglycemia: Secondary | ICD-10-CM

## 2014-02-22 LAB — HEMOGLOBIN AND HEMATOCRIT, BLOOD
HCT: 40.2 % (ref 39.0–52.0)
Hemoglobin: 13.1 g/dL (ref 13.0–17.0)

## 2014-02-22 LAB — BLOOD GAS, ARTERIAL
Acid-base deficit: 0.6 mmol/L (ref 0.0–2.0)
Bicarbonate: 24.8 mEq/L — ABNORMAL HIGH (ref 20.0–24.0)
DRAWN BY: 31101
FIO2: 1 %
O2 Saturation: 84 %
PATIENT TEMPERATURE: 98.6
PO2 ART: 51.4 mmHg — AB (ref 80.0–100.0)
TCO2: 22.5 mmol/L (ref 0–100)
pCO2 arterial: 46.3 mmHg — ABNORMAL HIGH (ref 35.0–45.0)
pH, Arterial: 7.348 — ABNORMAL LOW (ref 7.350–7.450)

## 2014-02-22 LAB — COMPREHENSIVE METABOLIC PANEL
ALBUMIN: 3.1 g/dL — AB (ref 3.5–5.2)
ALK PHOS: 82 U/L (ref 39–117)
ALT: 62 U/L — AB (ref 0–53)
ALT: 86 U/L — ABNORMAL HIGH (ref 0–53)
ANION GAP: 11 (ref 5–15)
AST: 81 U/L — ABNORMAL HIGH (ref 0–37)
AST: 114 U/L — ABNORMAL HIGH (ref 0–37)
Albumin: 3.1 g/dL — ABNORMAL LOW (ref 3.5–5.2)
Alkaline Phosphatase: 86 U/L (ref 39–117)
Anion gap: 11 (ref 5–15)
BILIRUBIN TOTAL: 0.6 mg/dL (ref 0.3–1.2)
BUN: 14 mg/dL (ref 6–23)
BUN: 14 mg/dL (ref 6–23)
CALCIUM: 9.2 mg/dL (ref 8.4–10.5)
CO2: 24 mEq/L (ref 19–32)
CO2: 28 mEq/L (ref 19–32)
Calcium: 8.9 mg/dL (ref 8.4–10.5)
Chloride: 99 mEq/L (ref 96–112)
Chloride: 96 mEq/L (ref 96–112)
Creatinine, Ser: 1.62 mg/dL — ABNORMAL HIGH (ref 0.50–1.35)
Creatinine, Ser: 1.67 mg/dL — ABNORMAL HIGH (ref 0.50–1.35)
GFR calc Af Amer: 52 mL/min — ABNORMAL LOW (ref >90)
GFR calc Af Amer: 51 mL/min — ABNORMAL LOW (ref >90)
GFR calc non Af Amer: 45 mL/min — ABNORMAL LOW (ref >90)
GFR calc non Af Amer: 44 mL/min — ABNORMAL LOW (ref >90)
Glucose, Bld: 152 mg/dL — ABNORMAL HIGH (ref 70–99)
Glucose, Bld: 144 mg/dL — ABNORMAL HIGH (ref 70–99)
POTASSIUM: 5.2 meq/L (ref 3.7–5.3)
Potassium: 4.7 mEq/L (ref 3.7–5.3)
Sodium: 134 mEq/L — ABNORMAL LOW (ref 137–147)
Sodium: 135 mEq/L — ABNORMAL LOW (ref 137–147)
TOTAL PROTEIN: 7 g/dL (ref 6.0–8.3)
Total Bilirubin: 0.4 mg/dL (ref 0.3–1.2)
Total Protein: 7.3 g/dL (ref 6.0–8.3)

## 2014-02-22 LAB — CBC WITH DIFFERENTIAL/PLATELET
BASOS ABS: 0 10*3/uL (ref 0.0–0.1)
BASOS PCT: 0 % (ref 0–1)
Eosinophils Absolute: 0 10*3/uL (ref 0.0–0.7)
Eosinophils Relative: 0 % (ref 0–5)
HCT: 40.8 % (ref 39.0–52.0)
Hemoglobin: 13.2 g/dL (ref 13.0–17.0)
Lymphocytes Relative: 8 % — ABNORMAL LOW (ref 12–46)
Lymphs Abs: 1.1 10*3/uL (ref 0.7–4.0)
MCH: 28.1 pg (ref 26.0–34.0)
MCHC: 32.4 g/dL (ref 30.0–36.0)
MCV: 86.8 fL (ref 78.0–100.0)
Monocytes Absolute: 0.9 10*3/uL (ref 0.1–1.0)
Monocytes Relative: 7 % (ref 3–12)
NEUTROS PCT: 85 % — AB (ref 43–77)
Neutro Abs: 11 10*3/uL — ABNORMAL HIGH (ref 1.7–7.7)
PLATELETS: 213 10*3/uL (ref 150–400)
RBC: 4.7 MIL/uL (ref 4.22–5.81)
RDW: 15.6 % — AB (ref 11.5–15.5)
WBC: 12.9 10*3/uL — ABNORMAL HIGH (ref 4.0–10.5)

## 2014-02-22 LAB — GLUCOSE, CAPILLARY
GLUCOSE-CAPILLARY: 123 mg/dL — AB (ref 70–99)
GLUCOSE-CAPILLARY: 134 mg/dL — AB (ref 70–99)
GLUCOSE-CAPILLARY: 147 mg/dL — AB (ref 70–99)
GLUCOSE-CAPILLARY: 155 mg/dL — AB (ref 70–99)
Glucose-Capillary: 147 mg/dL — ABNORMAL HIGH (ref 70–99)

## 2014-02-22 LAB — STREP PNEUMONIAE URINARY ANTIGEN: Strep Pneumo Urinary Antigen: NEGATIVE

## 2014-02-22 LAB — PROTIME-INR
INR: 1.04 (ref 0.00–1.49)
Prothrombin Time: 13.6 seconds (ref 11.6–15.2)

## 2014-02-22 LAB — MRSA PCR SCREENING: MRSA by PCR: NEGATIVE

## 2014-02-22 LAB — LEGIONELLA ANTIGEN, URINE: Legionella Antigen, Urine: NEGATIVE

## 2014-02-22 LAB — LACTIC ACID, PLASMA: LACTIC ACID, VENOUS: 0.8 mmol/L (ref 0.5–2.2)

## 2014-02-22 MED ORDER — CHLORHEXIDINE GLUCONATE 0.12 % MT SOLN
15.0000 mL | Freq: Two times a day (BID) | OROMUCOSAL | Status: DC
  Administered 2014-02-22 – 2014-02-27 (×9): 15 mL via OROMUCOSAL
  Filled 2014-02-22 (×13): qty 15

## 2014-02-22 MED ORDER — VANCOMYCIN HCL IN DEXTROSE 1-5 GM/200ML-% IV SOLN
1000.0000 mg | Freq: Two times a day (BID) | INTRAVENOUS | Status: DC
  Administered 2014-02-22 – 2014-02-23 (×4): 1000 mg via INTRAVENOUS
  Filled 2014-02-22 (×4): qty 200

## 2014-02-22 MED ORDER — FUROSEMIDE 10 MG/ML IJ SOLN
40.0000 mg | Freq: Once | INTRAMUSCULAR | Status: AC
  Administered 2014-02-22: 40 mg via INTRAVENOUS
  Filled 2014-02-22: qty 4

## 2014-02-22 MED ORDER — IPRATROPIUM-ALBUTEROL 0.5-2.5 (3) MG/3ML IN SOLN
3.0000 mL | Freq: Once | RESPIRATORY_TRACT | Status: AC
  Administered 2014-02-22: 3 mL via RESPIRATORY_TRACT

## 2014-02-22 MED ORDER — LIDOCAINE 5 % EX PTCH
1.0000 | MEDICATED_PATCH | Freq: Every day | CUTANEOUS | Status: DC
  Administered 2014-02-22 – 2014-02-27 (×6): 1 via TRANSDERMAL
  Filled 2014-02-22 (×7): qty 1

## 2014-02-22 MED ORDER — VANCOMYCIN HCL 10 G IV SOLR
1250.0000 mg | Freq: Two times a day (BID) | INTRAVENOUS | Status: DC
  Administered 2014-02-22: 1250 mg via INTRAVENOUS
  Filled 2014-02-22: qty 1250

## 2014-02-22 MED ORDER — BISACODYL 10 MG RE SUPP
10.0000 mg | Freq: Once | RECTAL | Status: AC
  Administered 2014-02-22: 10 mg via RECTAL
  Filled 2014-02-22: qty 1

## 2014-02-22 MED ORDER — SODIUM CHLORIDE 0.9 % IV SOLN
INTRAVENOUS | Status: DC
  Administered 2014-02-22: 01:00:00 via INTRAVENOUS

## 2014-02-22 MED ORDER — FENTANYL CITRATE 0.05 MG/ML IJ SOLN: 12.5000 ug | INTRAMUSCULAR | Status: DC | PRN

## 2014-02-22 MED ORDER — FUROSEMIDE 40 MG PO TABS
40.0000 mg | ORAL_TABLET | Freq: Every day | ORAL | Status: DC
  Administered 2014-02-22 – 2014-02-23 (×2): 40 mg via ORAL
  Filled 2014-02-22 (×2): qty 1

## 2014-02-22 MED ORDER — PIPERACILLIN-TAZOBACTAM 3.375 G IVPB
3.3750 g | Freq: Three times a day (TID) | INTRAVENOUS | Status: DC
  Administered 2014-02-22 – 2014-02-24 (×6): 3.375 g via INTRAVENOUS
  Filled 2014-02-22 (×7): qty 50

## 2014-02-22 MED ORDER — METOPROLOL TARTRATE 1 MG/ML IV SOLN
5.0000 mg | Freq: Four times a day (QID) | INTRAVENOUS | Status: DC
  Administered 2014-02-22 (×2): 5 mg via INTRAVENOUS
  Filled 2014-02-22 (×4): qty 5

## 2014-02-22 MED ORDER — FENTANYL CITRATE 0.05 MG/ML IJ SOLN: 12.5000 ug | Freq: Once | INTRAMUSCULAR | Status: DC

## 2014-02-22 MED ORDER — CETYLPYRIDINIUM CHLORIDE 0.05 % MT LIQD
7.0000 mL | Freq: Two times a day (BID) | OROMUCOSAL | Status: DC
  Administered 2014-02-22 – 2014-02-25 (×8): 7 mL via OROMUCOSAL

## 2014-02-22 MED ORDER — INSULIN ASPART 100 UNIT/ML ~~LOC~~ SOLN
0.0000 [IU] | SUBCUTANEOUS | Status: DC
  Administered 2014-02-22: via SUBCUTANEOUS
  Administered 2014-02-22 – 2014-02-23 (×5): 1 [IU] via SUBCUTANEOUS
  Administered 2014-02-23: 2 [IU] via SUBCUTANEOUS
  Administered 2014-02-23: 3 [IU] via SUBCUTANEOUS

## 2014-02-22 MED ORDER — PIPERACILLIN-TAZOBACTAM 3.375 G IVPB 30 MIN
3.3750 g | Freq: Once | INTRAVENOUS | Status: AC
  Administered 2014-02-22: 3.375 g via INTRAVENOUS
  Filled 2014-02-22: qty 50

## 2014-02-22 MED ORDER — VANCOMYCIN HCL IN DEXTROSE 1-5 GM/200ML-% IV SOLN: 1000.0000 mg | Freq: Two times a day (BID) | INTRAVENOUS | Status: DC

## 2014-02-22 NOTE — Progress Notes (Signed)
CARE MANAGEMENT NOTE 02/22/2014  Patient:  [name], [name]   Account Number:  [id number]  Date Initiated:  02/22/2014  Documentation initiated by:  Dahlila Pfahler  Subjective/Objective Assessment:   pt with robotic nephrectomy on [id number]/becoame hypoxic in the pacu, placed on a nrb venturi mask with Fi02 at 40-80% transferred to sdu.     Action/Plan:   patient is from home and does live alone/will need to follow for hhc needs.   Anticipated DC Date:  02/25/2014   Anticipated DC Plan:  HOME/SELF CARE  In-house referral  NA      DC Planning Services  NA      [facility] Choice  NA   Choice offered to / List presented to:  NA   DME arranged  NA      DME agency  NA     [place] arranged  NA      [place] agency  NA   Status of service:  In process, will continue to follow Medicare Important Message given?  NA - LOS <3 / Initial given by admissions (If response is "NO", the following Medicare IM given date fields will be blank) Date Medicare IM given:   Medicare IM given by:   Date Additional Medicare IM given:   Additional Medicare IM given by:    Discharge Disposition:    Per UR Regulation:  Reviewed for med. necessity/level of care/duration of stay  If discussed at [place] of Stay Meetings, dates discussed:    Comments:  [id number] Velva Harman, RN, BSN, CCM:  [phone] Chart reviewed.  No discharge needs present at time of this review. Next review of chart due on [id number].

## 2014-02-22 NOTE — Progress Notes (Signed)
1 Day Post-Op  Subjective:  1 - Large Right Renal Mass - s/p robotic right radical nephrectomy and extensive adhesiolysis 02/21/14 for 8cm cystic / solid enhanicng renal mass, path pending. Pre-op staging wihtout adenopathy or chest lesions. Cr <1.   2 - Renal Insufficiency - Baseline Cr 1.0. Now s/p nephrectomy. Cr, K acceptable on daily labs thus far.  3 - Post-operative Hypoxia - Transferred to ICU 8/6 for progressive somnolence, hypoxia. Critical care consultation sought and began empiric therapy for possible aspiration and optimizing pulm parmaters (NG for gastric decompression, diuresis), Began non-rereather.   Today [name] is more alert. AOx3, c/o some incisional pain as expected. NO CP. No unilateral leg swelling.    Objective: Vital signs in last 24 hours: Temp:  [97.3 F (36.3 C)-98.9 F (37.2 C)] 98.9 F (37.2 C) (08/06 0152) Pulse Rate:  [71-86] 77 (08/06 0600) Resp:  [13-24] 16 (08/06 0600) BP: (108-150)/(56-84) 120/64 mmHg (08/06 0600) SpO2:  [84 %-96 %] 95 % (08/06 0600) FiO2 (%):  [40 %-80 %] 80 % (08/06 0400) Weight:  [109.7 kg (241 lb 13.5 oz)] 109.7 kg (241 lb 13.5 oz) (08/06 0334) Last BM Date: 02/21/14  Intake/Output from previous day: 08/05 0701 - 08/06 0700 In: 4114.6 [P.O.:720; I.V.:3094.6; IV Piggyback:300] Out: 1950 [Urine:1700; Emesis/NG output:200; Blood:50] Intake/Output this shift:    General appearance: alert, cooperative and in ICU on NRB Head: Normocephalic, without obvious abnormality, atraumatic Nose: Nares normal. Septum midline. Mucosa normal. No drainage or sinus tenderness., NGT with minimal output Throat: lips, mucosa, and tongue normal; teeth and gums normal Neck: supple, symmetrical, trachea midline Back: symmetric, no curvature. ROM normal. No CVA tenderness. Resp: non-labored on NRB Cardio: Nl rate GI: soft, non-tender; bowel sounds normal; no masses,  no organomegaly and obese abdomen as per baseline. Not distended over baseline.   Male genitalia: normal, foley c/d/i with clear urine.  Extremities: extremities normal, atraumatic, no cyanosis or edema Pulses: 2+ and symmetric Skin: Skin color, texture, turgor normal. No rashes or lesions Lymph nodes: Cervical, supraclavicular, and axillary nodes normal. Neurologic: Grossly normal Incision/Wound: Recent port sites / extraction sites c/d/i.   Lab Results:   Recent Labs  02/22/14 0047 02/22/14 0300  WBC  --  12.9*  HGB 13.1 13.2  HCT 40.2 40.8  PLT  --  213   BMET  Recent Labs  02/22/14 0300  NA 134*  K 5.2  CL 99  CO2 24  GLUCOSE 152*  BUN 14  CREATININE 1.62*  CALCIUM 8.9   PT/INR  Recent Labs  02/22/14 0300  LABPROT 13.6  INR 1.04   ABG  Recent Labs  02/22/14 0146  PHART 7.348*  HCO3 24.8*    Studies/Results: Dg Abd 1 View  02/22/2014   CLINICAL DATA:  Abdominal distention and pain.  EXAM: ABDOMEN - 1 VIEW  COMPARISON:  12/20/2013  FINDINGS: No evidence of bowel obstruction. There is moderate gaseous distension of the stomach. No concerning intra-abdominal mass effect or calcification. Pneumoperitoneum visible along the inferior liver surface.  IMPRESSION: 1. Nonobstructive bowel gas pattern. Moderate gaseous distention of the stomach. 2. Pneumoperitoneum after recent laparoscopic abdominal surgery.   Electronically Signed   By: [name] M.D.   On: 02/22/2014 01:42   Dg Chest Port 1 View  02/22/2014   CLINICAL DATA:  Hypoxia and shortness of breath  EXAM: PORTABLE CHEST - 1 VIEW  COMPARISON:  02/14/2014  FINDINGS: Low lung volumes with bibasilar opacities that containing air bronchograms. Interstitial coarsening in the  upper lungs, likely from interstitial crowding and atelectasis. No edema suspected. Likely no cardiomegaly when considering the low lung volumes.  There is a moderate volume of pneumoperitoneum in the upper abdomen. Noted recent laparoscopic abdominal surgery.  IMPRESSION: 1. Low lung volumes with bibasilar atelectasis  or aspiration. 2. Moderate pneumoperitoneum status post recent laparoscopic abdominal surgery.   Electronically Signed   By: [name] M.D.   On: 02/22/2014 01:40   Dg Abd Portable 1v  02/22/2014   CLINICAL DATA:  Nasogastric tube placement.  EXAM: PORTABLE ABDOMEN - 1 VIEW  COMPARISON:  Abdominal sonogram February 22, 2014 at 1:23 a.m.  FINDINGS: Nasogastric fluid looped in proximal stomach with distal tip projecting at gastric antrum. Surgical clips in the included right abdomen likely reflect cholecystectomy. Nonspecific bowel gas pattern. Pneumoperitoneum consistent with recent surgery.  IMPRESSION: Nasogastric tube tip projects in gastric antrum.   Electronically Signed   By: [name]   On: 02/22/2014 03:54    Anti-infectives: Anti-infectives   Start     Dose/Rate Route Frequency Ordered Stop   02/22/14 1400  piperacillin-tazobactam (ZOSYN) IVPB 3.375 g     3.375 g 12.5 mL/hr over 240 Minutes Intravenous 3 times per day 02/22/14 0534     02/22/14 0600  vancomycin (VANCOCIN) 1,250 mg in sodium chloride 0.9 % 250 mL IVPB     1,250 mg 166.7 mL/hr over 90 Minutes Intravenous Every 12 hours 02/22/14 0534     02/22/14 0545  piperacillin-tazobactam (ZOSYN) IVPB 3.375 g     3.375 g 100 mL/hr over 30 Minutes Intravenous  Once 02/22/14 0534 02/22/14 0624   02/21/14 0707  ceFAZolin (ANCEF) IVPB 2 g/50 mL premix     2 g 100 mL/hr over 30 Minutes Intravenous 30 min pre-op 02/21/14 0707 02/21/14 0900      Assessment/Plan:  1 - Large Right Renal Mass - path pending for large clinically localized mass.   2 - Renal Insufficiency - Cr up this AM as expected, but K and volume acceptable. Rec very judicious use of vancomycin / diuresis.   3 - Post-operative Hypoxia - Appreciate PCCM help in management. Overall picture most c/w narcotic effect + post-op pain / shallow breathing. Agree with short term NG, but prefer removal tomorrow if progressing. Explained to pt importance of ambulation as  well.  PT eval, Ducolax spp x1.    [facility], [name] 02/22/2014

## 2014-02-22 NOTE — Progress Notes (Signed)
ANTIBIOTIC CONSULT NOTE - Follow-up (BRIEF NOTE)  Pharmacy Consult for Vancomycin and Zosyn  Indication: pneumonia  73 YOM started on vancomycin and zosyn this am for possible pneumonia s/p R nephrectomy for R renal mass  A/P:  Change vancomycin to 1gm IV q12h based on wt, inc in Scr, and POD1 nephrectomy,  Give dose now to complete loading dose  No Known Allergies  Patient Measurements: Height: 6' (182.9 cm) Weight: 241 lb 13.5 oz (109.7 kg) IBW/kg (Calculated) : 77.6 Adjusted Body Weight:   Vital Signs: Temp: 98.1 F (36.7 C) (08/06 0800) Temp src: Axillary (08/06 0800) BP: 151/81 mmHg (08/06 0800) Pulse Rate: 76 (08/06 0800) Intake/Output from previous day: 08/05 0701 - 08/06 0700 In: 4114.6 [P.O.:720; I.V.:3094.6; IV Piggyback:300] Out: 1950 [Urine:1700; Emesis/NG output:200; Blood:50] Intake/Output from this shift:    Labs:  Recent Labs  02/21/14 1238 02/22/14 0047 02/22/14 0300  WBC  --   --  12.9*  HGB 13.8 13.1 13.2  PLT  --   --  213  CREATININE  --   --  1.62*   Estimated Creatinine Clearance: 63.6 ml/min (by C-G formula based on Cr of 1.62). No results found for this basename: VANCOTROUGH, Corlis Leak, VANCORANDOM, GENTTROUGH, GENTPEAK, GENTRANDOM, TOBRATROUGH, TOBRAPEAK, TOBRARND, AMIKACINPEAK, AMIKACINTROU, AMIKACIN,  in the last 72 hours   Microbiology: Recent Results (from the past 720 hour(s))  MRSA PCR SCREENING     Status: None   Collection Time    02/22/14  3:00 AM      Result Value Ref Range Status   MRSA by PCR NEGATIVE  NEGATIVE Final   Comment:            The GeneXpert MRSA Assay (FDA     approved for NASAL specimens     only), is one component of a     comprehensive MRSA colonization     surveillance program. It is not     intended to diagnose MRSA     infection nor to guide or     monitor treatment for     MRSA infections.   Medications:  Anti-infectives   Start     Dose/Rate Route Frequency Ordered Stop   02/22/14 1400   piperacillin-tazobactam (ZOSYN) IVPB 3.375 g     3.375 g 12.5 mL/hr over 240 Minutes Intravenous 3 times per day 02/22/14 0534     02/22/14 0600  vancomycin (VANCOCIN) 1,250 mg in sodium chloride 0.9 % 250 mL IVPB     1,250 mg 166.7 mL/hr over 90 Minutes Intravenous Every 12 hours 02/22/14 0534     02/22/14 0545  piperacillin-tazobactam (ZOSYN) IVPB 3.375 g     3.375 g 100 mL/hr over 30 Minutes Intravenous  Once 02/22/14 0534 02/22/14 0624   02/21/14 0707  ceFAZolin (ANCEF) IVPB 2 g/50 mL premix     2 g 100 mL/hr over 30 Minutes Intravenous 30 min pre-op 02/21/14 2637 02/21/14 0900     Doreene Eland, PharmD, BCPS.   Pager: [phone]  02/22/2014,9:36 AM

## 2014-02-22 NOTE — Progress Notes (Signed)
7:39 AM I agree with HPI/GPe and A/P per Dr. Posey Pronto  58 y/o ?, known h/o CAD s/p PCI->LAD8/2014-most recent cath 12/20/13=non-obst disease, DM ty 2 since 2011, Htn, GAD, Cocaine use, recent cholectomy 12/18/13 admit, Prior TIA '90s admitted electively for R Nephrectomy, Developed in PACU Acute hypoxic respiratory failure-noted + 4 liters fluids during procedure  Given  IV lasix 40 mg x 1 and placed on CPAP NGt placed as significant pneumoperitoneum  Doing fair this am.  Was on [place] but when taken off desatted to mid-80's Sats come back up to 94% with NRB NGT still in situ Patient sleepy denies overt abd pain     HEENT alert but still a little sleepy CHEST coarse BS bilaterally CARDIAC s1 s2 no m/r/g, Tele  ABDOMEN distended, Mildy genrally tender.  BS decreased NEURO intact following commands SKIN/MUSCULAR surgical wounds seem clean   Patient Active Problem List   Diagnosis Date Noted  . Acute respiratory failure with hypoxia 02/22/2014  . Fluid overload 02/22/2014  . Renal neoplasm 02/21/2014  . Preoperative cardiovascular examination 02/07/2014  . Renal mass, right 12/21/2013  . Coronary atherosclerosis of native coronary artery   . Substance abuse   . Hyperlipidemia   . Diabetes mellitus type 2, uncontrolled, with cardiovascular complications 57/07/7791  . Tobacco abuse 06/01/2012  . H/O medication noncompliance 06/01/2012  . Cocaine abuse 06/01/2012  . Essential hypertension, benign    Agree with POC as per Dr. Posey Pronto Lasix 40 PO once now Aim for sats 88% or above Trial clears this afternoon?-Would defer decision to Urology Post-surgical prophylactic Abx only for now-not clear a Pnuemonia-Get 2 vw CXR 8/7/am-will decide then regarding further Abx coverage NPO for now-may be able to re-start home meds today later on-ASA/plavix to resume as per Urology discretion PRN metoprolol with parameters  [name], MD [facility] (P) [phone]

## 2014-02-22 NOTE — Consult Note (Addendum)
[facility] Initial Consult Note  [name]  PXT:[id number]  DOB: 09-09-1955  DOA: 02/21/2014 DOS: the patient was seen and examined on 02/22/2014   Referring physician: Dr Ruta Hinds urology PCP: [name], BETTY G, MD   Reason for consult: hypoxia  HPI: [name] is a 58 y.o. male with Past medical history of coronary artery disease, GERD, diabetes, COPD, TIA, hypertension, renal cancer. The patient presented today for his elective right nephrectomy which was performed laparoscopically under general anesthesia. Surgery was performed around noon and Postoperatively the patient was cleared from PACU and later on transferred to step down around 1 AM 02/21/2014 due to hypoxia. Preoperative the patient was 96% on room air and Postoperatively the patient's oxygen saturation were 90% on 4 L. Midnight the patient become less and less responsive and drowsy. He also do more hypoxic requiring Ventimask. Patient was transferred to ICU. Patient at the time of my evaluation complain of significant abdominal pain more on the right right upper quadrant which felt like a sharp pain. He also complained of shortness of breath with cough without any chest pain. He denies any dizziness or lightheadedness. Denies any fever or chills. He denies any nausea or vomiting. There was no diarrhea or constipation. Patient has 200 cc of urine output postoperatively and 50 cc in last 1 hour. He had 4 L of IV fluids roughly during the procedure. As the patient's oxygenation was worsening, he was placed on nonrebreather and medicine was consulted for further management.   Review of Systems: as mentioned in the history of present illness.  A Comprehensive review of the other systems is negative.  Past Medical History  Diagnosis Date  . Essential hypertension, benign   . TIA (transient ischemic attack) 1997    No residual neurological deficits.  . Noncompliance   . COPD (chronic obstructive pulmonary disease)    . Hyperlipidemia   . History of pneumonia   . Type 2 diabetes mellitus 2011  . GERD (gastroesophageal reflux disease)   . Substance abuse   . Cocaine abuse     02-14-14 dicusses freely with medical team, prefers no discussions with family present.  . Renal mass, right     surgery planned 02-21-14  . Cholecystitis, acute 12/20/2013    S/p Lap chole 6/5  . Coronary atherosclerosis of native coronary artery     a. 03/09/2013 Cath/PCI: LM nl, LAD: 50p, 54m (2.5x16 promus DES), LCX nl, OM1 min irregs, LPL/LPDA diff dzs, RCA nondom, mod diff dzs, EF 55%.  . Anginal pain     has two small heart arteries with blockage.  . Shortness of breath     with exertion always.  . Depression   . Anxiety   . History of kidney stones     several times in the 90's  . Arthritis     arm -right and both legs  . Cancer     right kidney cancer dx. recent  . Hepatitis Late 1970s    ?Hep. C-Treated in the hospital, finished treatment course. No recurrence.   Past Surgical History  Procedure Laterality Date  . Appendectomy  1970's  . Cholecystectomy N/A 12/22/2013    Procedure: LAPAROSCOPIC CHOLECYSTECTOMY ;  Surgeon: [name], MD;  Location: [place];  Service: General;  Laterality: N/A;  . Coronary stent placement    . Cardiac catheterization      6'15 -cardiac cath" found 2 small vessels with some blockage - vessels too small for stents.  Social History:  reports that he has been smoking Cigarettes.  He has a 20.5 pack-year smoking history. He has never used smokeless tobacco. He reports that he does not drink alcohol or use illicit drugs. Patient is coming from home.  No Known Allergies  Family History  Problem Relation Age of Onset  . Hypertension    . Diabetes    . Stroke    . Lung cancer Father   . Cancer Mother     Thyroid - living in her 84's.  . Cancer Maternal Grandmother     Breast  . Cancer Maternal Grandfather     Throat and stomach  . CAD Father   . CAD Brother     Prior  to Admission medications   Medication Sig Start Date End Date Taking? Authorizing Provider  citalopram (CELEXA) 40 MG tablet Take 40 mg by mouth every morning.   Yes Historical Provider, MD  diltiazem (CARDIZEM CD) 240 MG 24 hr capsule Take 240 mg by mouth every morning.   Yes Historical Provider, MD  fish oil-omega-3 fatty acids 1000 MG capsule Take 1 g by mouth 4 (four) times daily.   Yes Historical Provider, MD  FLUoxetine (PROZAC) 10 MG capsule Take 10 mg by mouth every morning.   Yes Historical Provider, MD  gabapentin (NEURONTIN) 300 MG capsule Take 300 mg by mouth at bedtime.   Yes Historical Provider, MD  glipiZIDE (GLUCOTROL) 10 MG tablet Take 10 mg by mouth 2 (two) times daily.   Yes Historical Provider, MD  ipratropium-albuterol (DUONEB) 0.5-2.5 (3) MG/3ML SOLN Take 3 mLs by nebulization every 6 (six) hours as needed (Shortness of breath).   Yes Historical Provider, MD  isosorbide mononitrate (IMDUR) 60 MG 24 hr tablet Take 60 mg by mouth every morning.    Yes Historical Provider, MD  lisinopril (PRINIVIL,ZESTRIL) 5 MG tablet Take 5 mg by mouth 2 (two) times daily.   Yes Historical Provider, MD  nitroGLYCERIN (NITROSTAT) 0.4 MG SL tablet Place 1 tablet (0.4 mg total) under the tongue every 5 (five) minutes as needed for chest pain. 06/03/12  Yes Ivor Costa, MD  ranitidine (ZANTAC) 300 MG tablet Take 300 mg by mouth every morning.    Yes Historical Provider, MD  rosuvastatin (CRESTOR) 20 MG tablet Take 20 mg by mouth at bedtime.   Yes Historical Provider, MD  sitaGLIPtin (JANUVIA) 100 MG tablet Take 100 mg by mouth every morning.    Yes Historical Provider, MD  aspirin 81 MG chewable tablet Chew 1 tablet (81 mg total) by mouth daily. 11/05/12   Reyne Dumas, MD  clopidogrel (PLAVIX) 75 MG tablet Take 1 tablet (75 mg total) by mouth daily with breakfast. 06/03/12   Ivor Costa, MD  HYDROcodone-acetaminophen (NORCO) 5-325 MG per tablet Take 1-2 tablets by mouth every 6 (six) hours as needed.  02/21/14   Marcie Bal, PA-C    Physical Exam: Filed Vitals:   02/22/14 0116 02/22/14 0152 02/22/14 0200 02/22/14 0300  BP:  140/84  150/84  Pulse:  85 86 78  Temp:  98.9 F (37.2 C)    TempSrc:  Axillary    Resp:  21 23 14   Height:      Weight:      SpO2: 92% 90% 89% 92%    General: Alert, Awake and Oriented to Time, Place and Person. Appear in moderate distress Eyes: PERRL ENT: Oral Mucosa clear moist. Neck: Difficult to assess JVD, no Carotid Bruits  Cardiovascular: S1 and S2 Present,  no Murmur, Peripheral Pulses Present Respiratory: Bilateral Air entry equal and Decreased, bilateral Crackles, no wheezes Abdomen: Bowel Sound Present, Soft and significant abdominal distention with diffuse abdominal pain Skin: No Rash Extremities: Trace Pedal edema, no calf tenderness Neurologic: Grossly Unremarkable.  Labs:  Basic Metabolic Panel:  Recent Labs Lab 02/22/14 0300  NA 134*  K 5.2  CL 99  CO2 24  GLUCOSE 152*  BUN 14  CREATININE 1.62*  CALCIUM 8.9   Liver Function Tests:  Recent Labs Lab 02/22/14 0300  AST 81*  ALT 62*  ALKPHOS 82  BILITOT 0.4  PROT 7.0  ALBUMIN 3.1*   No results found for this basename: LIPASE, AMYLASE,  in the last 168 hours No results found for this basename: AMMONIA,  in the last 168 hours CBC:  Recent Labs Lab 02/21/14 1238 02/22/14 0047 02/22/14 0300  WBC  --   --  12.9*  NEUTROABS  --   --  11.0*  HGB 13.8 13.1 13.2  HCT 42.5 40.2 40.8  MCV  --   --  86.8  PLT  --   --  213   Cardiac Enzymes: No results found for this basename: CKTOTAL, CKMB, CKMBINDEX, TROPONINI,  in the last 168 hours  BNP (last 3 results)  Recent Labs  03/09/13 0047 12/18/13 1158 12/27/13 0944  PROBNP 41.1 54.7 234.5*   CBG:  Recent Labs Lab 02/21/14 0651 02/21/14 1256 02/21/14 1650 02/21/14 2206  GLUCAP 122* 211* 146* 118*    Radiological Exams: Dg Abd 1 View  02/22/2014   CLINICAL DATA:  Abdominal distention and pain.  EXAM:  ABDOMEN - 1 VIEW  COMPARISON:  12/20/2013  FINDINGS: No evidence of bowel obstruction. There is moderate gaseous distension of the stomach. No concerning intra-abdominal mass effect or calcification. Pneumoperitoneum visible along the inferior liver surface.  IMPRESSION: 1. Nonobstructive bowel gas pattern. Moderate gaseous distention of the stomach. 2. Pneumoperitoneum after recent laparoscopic abdominal surgery.   Electronically Signed   By: Jorje Guild M.D.   On: 02/22/2014 01:42   Dg Chest Port 1 View  02/22/2014   CLINICAL DATA:  Hypoxia and shortness of breath  EXAM: PORTABLE CHEST - 1 VIEW  COMPARISON:  02/14/2014  FINDINGS: Low lung volumes with bibasilar opacities that containing air bronchograms. Interstitial coarsening in the upper lungs, likely from interstitial crowding and atelectasis. No edema suspected. Likely no cardiomegaly when considering the low lung volumes.  There is a moderate volume of pneumoperitoneum in the upper abdomen. Noted recent laparoscopic abdominal surgery.  IMPRESSION: 1. Low lung volumes with bibasilar atelectasis or aspiration. 2. Moderate pneumoperitoneum status post recent laparoscopic abdominal surgery.   Electronically Signed   By: Jorje Guild M.D.   On: 02/22/2014 01:40    Assessment/Plan Active Problems:   Essential hypertension, benign   Diabetes mellitus type 2, uncontrolled, with cardiovascular complications   Coronary atherosclerosis of native coronary artery   Renal neoplasm   Acute respiratory failure with hypoxia   Fluid overload   1. Acute hypoxic respiratory failure Possible fluid overload Stomach distention Pneumoperitoneum Postoperative abdominal pain  The patient has undergone robotic right nephrectomy. He has postoperative pneumoperitoneum which is expected with laparoscopic procedure. He complains of symptoms of abdominal pain and shortness of breath. His chest x-ray shows significantly low volume as compared to his prior chest  x-ray preoperatively with bilateral atelectasis and possible condition. His x-ray of the abdomen shows distended stomach along with pneumoperitoneum. His ABG so his significant hypoxia without any hypercarbia.  Intra-abdominal pressure with Foley catheter was 7 the Possible etiology of his current acute Hypoxic respiratory failure is low lung volume with atelectasis due to pain and pneumoperitoneum making ventilation difficult.  Other possibility also includes fluid overload due to intraoperative fluid resuscitation as well as has been associated pneumonia after intubation.  He does not have any wheezing or hypercarbia suggesting any COPD exacerbation.  With this an NG tube was inserted to decompress his stomach with which there was improvement in his abdominal pain as well as distention. currently I will leave the NG tube at low intermittent suction. Patient was given 40 mg of IV Lasix. Broad-spectrum antibiotic will be started with IV vancomycin and Zosyn follow urine culture and sputum culture. Patient was placed on CPAP at 6 with which he was ventilating with tidal volume of 600+ with improvement in his oxygenation.   2 pain management For his pain management I will place him on fentanyl instead of Dilaudid and we'll also place a lidocaine patch in the operative site on the right.  3. diabetes mellitus Currently maintaining patient on n.p.o. except medication Containing only with sliding scale discontinue scheduled insulin  4.Coronary artery disease   continue with home medications   Thank you very much for involving [place] in care of your patient.  We will continue to follow the patient.   Author: Berle Mull, MD [facility] Pager: [phone] 02/22/2014 3:36 AM    If 7PM-7AM, please contact night-coverage [email or web address] Password [email or web address]

## 2014-02-22 NOTE — Progress Notes (Signed)
ANTIBIOTIC CONSULT NOTE - INITIAL  Pharmacy Consult for Vancomycin and Zosyn  Indication: pneumonia  No Known Allergies  Patient Measurements: Height: 6' (182.9 cm) Weight: 238 lb 6 oz (108.126 kg) IBW/kg (Calculated) : 77.6 Adjusted Body Weight:   Vital Signs: Temp: 98.9 F (37.2 C) (08/06 0152) Temp src: Axillary (08/06 0152) BP: 150/84 mmHg (08/06 0334) Pulse Rate: 80 (08/06 0334) Intake/Output from previous day: 08/05 0701 - 08/06 0700 In: 3814.6 [P.O.:720; I.V.:3094.6] Out: 725 [Urine:675; Blood:50] Intake/Output from this shift: Total I/O In: 949.6 [P.O.:240; I.V.:709.6] Out: 475 [Urine:475]  Labs:  Recent Labs  02/21/14 1238 02/22/14 0047 02/22/14 0300  WBC  --   --  12.9*  HGB 13.8 13.1 13.2  PLT  --   --  213  CREATININE  --   --  1.62*   Estimated Creatinine Clearance: 63.1 ml/min (by C-G formula based on Cr of 1.62). No results found for this basename: VANCOTROUGH, Corlis Leak, VANCORANDOM, GENTTROUGH, GENTPEAK, GENTRANDOM, TOBRATROUGH, TOBRAPEAK, TOBRARND, AMIKACINPEAK, AMIKACINTROU, AMIKACIN,  in the last 72 hours   Microbiology: Recent Results (from the past 720 hour(s))  MRSA PCR SCREENING     Status: None   Collection Time    02/22/14  3:00 AM      Result Value Ref Range Status   MRSA by PCR NEGATIVE  NEGATIVE Final   Comment:            The GeneXpert MRSA Assay (FDA     approved for NASAL specimens     only), is one component of a     comprehensive MRSA colonization     surveillance program. It is not     intended to diagnose MRSA     infection nor to guide or     monitor treatment for     MRSA infections.    Medical History: Past Medical History  Diagnosis Date  . Essential hypertension, benign   . TIA (transient ischemic attack) 1997    No residual neurological deficits.  . Noncompliance   . COPD (chronic obstructive pulmonary disease)   . Hyperlipidemia   . History of pneumonia   . Type 2 diabetes mellitus 2011  . GERD  (gastroesophageal reflux disease)   . Substance abuse   . Cocaine abuse     02-14-14 dicusses freely with medical team, prefers no discussions with family present.  . Renal mass, right     surgery planned 02-21-14  . Cholecystitis, acute 12/20/2013    S/p Lap chole 6/5  . Coronary atherosclerosis of native coronary artery     a. 03/09/2013 Cath/PCI: LM nl, LAD: 50p, 36m (2.5x16 promus DES), LCX nl, OM1 min irregs, LPL/LPDA diff dzs, RCA nondom, mod diff dzs, EF 55%.  . Anginal pain     has two small heart arteries with blockage.  . Shortness of breath     with exertion always.  . Depression   . Anxiety   . History of kidney stones     several times in the 90's  . Arthritis     arm -right and both legs  . Cancer     right kidney cancer dx. recent  . Hepatitis Late 1970s    ?Hep. C-Treated in the hospital, finished treatment course. No recurrence.    Medications:  Anti-infectives   Start     Dose/Rate Route Frequency Ordered Stop   02/22/14 1400  piperacillin-tazobactam (ZOSYN) IVPB 3.375 g     3.375 g 12.5 mL/hr over 240 Minutes Intravenous 3  times per day 02/22/14 0534     02/22/14 0600  vancomycin (VANCOCIN) 1,250 mg in sodium chloride 0.9 % 250 mL IVPB     1,250 mg 166.7 mL/hr over 90 Minutes Intravenous Every 12 hours 02/22/14 0534     02/22/14 0545  piperacillin-tazobactam (ZOSYN) IVPB 3.375 g     3.375 g 100 mL/hr over 30 Minutes Intravenous  Once 02/22/14 0534     02/21/14 0707  ceFAZolin (ANCEF) IVPB 2 g/50 mL premix     2 g 100 mL/hr over 30 Minutes Intravenous 30 min pre-op 02/21/14 0707 02/21/14 0900     Assessment: Patient with HCAP.    Goal of Therapy:  Vancomycin trough level 15-20 mcg/ml Zosyn based on renal function   Plan:  Measure antibiotic drug levels at steady state Follow up culture results Vancomycin 1250mg  iv q12hr,  Zosyn 3.375g IV Q8H infused over 4hrs, after zosyn 3.375gm x1 over 29mins  Tyler Deis, Shea Stakes Crowford 02/22/2014,5:36 AM

## 2014-02-22 NOTE — Op Note (Signed)
[name], Kevin NO.:  [id number]  MEDICAL RECORD NO.:  [id number]  LOCATION:  9528                         FACILITY:  [facility]  PHYSICIAN:  [name], MD     DATE OF BIRTH:  [birth year]  DATE OF PROCEDURE:  02/21/2014 DATE OF DISCHARGE:                              OPERATIVE REPORT   DIAGNOSIS:  Large right renal mass.  PROCEDURE: 1. Robotic-assisted laparoscopic right radical nephrectomy. 2. Laparoscopic extensive adhesiolysis.  SURGEON:  [name], MD.  ASSISTANT: 1. Leta Baptist, PA. 2. Charlton Haws, MD.  ESTIMATED BLOOD LOSS:  80 mL.  COMPLICATIONS:  None.  SPECIMEN:  Right radical nephrectomy.  FINDINGS: 1. Extensive dense abdominal adhesions within the right upper quadrant     consistent with likely changes status post cholecystectomy as well     as right lower quadrant consistent with inflammatory changes status     post appendectomy necessitating laparoscopic adhesiolysis,     approximately 1 hour in duration. 2. Two artery, 1 vein right renovascular anatomy.  INDICATION:  [name] is a 58 year old gentleman who was found incidentally on workup of symptomatic cholelithiasis to have a large right lower pole renal mass, clinically T1b.  Options were discussed for management including observation versus ablative therapies for history of surgical extirpation with and without nephron sparing and with and without minimally invasive assistance.  He wished to proceed with right radical nephrectomy.  Informed consent was obtained and placed in medical record.  PROCEDURE IN DETAIL:  The patient being [name] was verified, procedure being right robotic radical nephrectomy was confirmed. Procedure was carried out.  Time-out was performed.  Intravenous antibiotics were administered.  General endotracheal anesthesia was introduced.  The patient placed into a right side up full flank position applying 15 degrees stable flexion,  superior arm elevator, axillary roll, beanbag, bottom leg bent, top leg straight, sequential compression devices, and he was further fashioned on the operative table using 3- inch tape for foam padding.  Next, his right abdomen and flank were clipped and shaved and then prepped and draped using chlorhexidine gluconate after Foley catheter was placed per urethra to straight drain. Next, high-flow low pressure pneumoperitoneum was obtained using Veress technique in the right lower quadrant having passed the aspiration and drop test.  Next, a 12-mm robotic camera port was placed in position approximately 1 handbreadth lateral to the midline between the xiphoid and the umbilicus.  Laparoscopic examination of peritoneal cavity revealed multifocal very dense adhesions in the right upper quadrant and right lower quadrant consistent with prior surgery and likely severe appendicitis in the past.  At this point, a right subcostal 8-mm robotic port and a right inferior paramedian robotic port were carefully placed. Next, a 5-mm subxiphoid port was placed for future liver retraction. Additional laparoscopic adhesiolysis was performed using cold laparoscopic scissors taking down dense omental adhesions in the right lower quadrant freeing them from the abdominal wall. .  A right far lateral 8-mm robotic port approximately 1 handbreadth superior medial to the anterior iliac spine using laparoscopic vision through this lateral port.  Incision of the abdominal wall were easily visualized.  Adhesiolysis was performed of omental attachments  allowing better visualization of the anterior abdominal wall.  Using this visualization, a 12-mm assistant port was placed approximately 4 fingerbreadths superior to the camera port in a paramedian location and a 12 mm assistant port approximately 3-4 fingerbreadths beneath the camera port in the paramedian location. Robot was then docked and passed through electronic  checks.  Attention was directed to additional adhesiolysis.  Very careful dissection was performed releasing dense adhesions away from the inferior edge of the liver and lateral abdominal wall.  This allowed hepatic mobilization enough such that a self-locking grasper could be brought across the inferior border of the liver placing on gentle superior traction, which further exposed the area of the renal dissection.  Great care was taken to avoid any direct trauma to the liver, and this did not occur.  Liver was quite large consistent with likely known diagnosis of hepatitis. Additional dense adhesions were taken down in the right lower quadrant dissecting tissue lateral to the cecum away from the abdominal wall. Ascending colon was then carefully mobilized medially.  The inferior portion of the kidney, which consisted of large mass was seen, this was placed on gentle lateral traction and dissection proceeded medial to this.  The right ureter and gonadal were encountered.  The gonadal was allowed to fall medially and the ureter was carefully kept lateral. Dissection proceeded within this triangle for his renal hilum.  The renal hilum consisted of 2 artery, 1 vein right renovascular anatomy as anticipated.  Each artery was controlled using cold clipping 3 down, 1 up with cold ligation.  The vein was controlled using an endovascular stapler, which resulted in excellent hemostatic control of the hilum. Dissection then proceeded lateral to the adrenal gland in the plane between the cava and adrenal and some of these attachments were taken down using endovascular stapler, taking great care to avoid any injury to the cava, this did not occur.  Superior attachments were then taken down, as well as lateral attachments.  Finally, the ureter and inferior tissue were divided with endovascular stapler, and this completely mobilized the right radical nephrectomy specimen.  At this point, robot was  undocked and using laparoscopic guidance, the previous 12-mm camera port site and incision port sites were closed with fascia using Eulas Post- Thomason suture passer using the lateral robotic port site for laparoscopic vision.  Given the amount of adhesions in the anterior abdominal wall, a lateral traction site was chosen and the previous 8 mm far lateral port site was expanded to 15 mm allowing placement of an extra large EndoCatch bag, which the radical nephrectomy specimen was carefully guided, extraction was performed by extending this site for a total distance of approximately 6 cm to the skin, subcutaneous tissue, and incising the fascia spreading the muscle and then removing the right radical nephrectomy specimen and setting aside for permanent pathology. The extraction site was closed by first effluxing the table approximately 50%, reapproximating the transversalis and internal oblique layers using running Vicryl to prevent internal hernia and the external oblique using figure-of-eight PDS followed by Vicryl.  Next, 40 mL of lipolyzed Marcaine was infiltrated in all incisions, extraction port sites, and skin was reapproximated using subcuticular Monocryl followed by Dermabond.  Procedure was then terminated.  The patient tolerated the procedure well.  There were no immediate postoperative complications.  The patient was taken to the postanesthesia care unit in stable condition.          ______________________________ [name], MD     TM/MEDQ  D:  02/21/2014  T:  02/21/2014  Job:  [id number]

## 2014-02-22 NOTE — Significant Event (Signed)
Rapid Response Event Note  Overview: Time Called: 0040 Arrival Time: 0045 Event Type: Respiratory  Initial Focused Assessment: Patient very drowsy with sats as low as 82% on 50% VM. Fairly strong cough and nonproductive. Will wake to voice and follow commands. Abdomen distended and firm with decreased bowel sounds.    Interventions: NRB put on and CXR done--(discussed with Dr. Lake Bells)-- KUB done. Orders to transfer to [place] and transferred to 1223.    Event Summary: Name of Physician Notified: A. Yarborough PA at 0100--had been called prior to arrival and paged several times. Order given to transfer.   Outcome: Transferred (Comment)  Event End Time: 0155  Pricilla Riffle

## 2014-02-22 NOTE — Progress Notes (Signed)
Pt lethargic but arousable to verbal stimuli, O2 sats were 88-90 on 4L [place], now 86 on 50% VM. Pt with 250cc of dark yellow urine since surgery, abdomen firm and distended. VSS. Felipa Furnace, PA notified, new orders placed in epic and followed through. Will continue to monitor.

## 2014-02-22 NOTE — Progress Notes (Signed)
PT Cancellation Note  Patient Details Name: CARRON JAGGI MRN: [id number] DOB: [birth year]   Cancelled Treatment:    Reason Eval/Treat Not Completed: Medical issues which prohibited therapy   Weston Anna, MPT Pager: [phone]

## 2014-02-23 ENCOUNTER — Inpatient Hospital Stay (HOSPITAL_COMMUNITY): Payer: Medicaid Other

## 2014-02-23 LAB — COMPREHENSIVE METABOLIC PANEL
ALBUMIN: 3 g/dL — AB (ref 3.5–5.2)
ALT: 105 U/L — AB (ref 0–53)
AST: 103 U/L — AB (ref 0–37)
Alkaline Phosphatase: 79 U/L (ref 39–117)
Anion gap: 10 (ref 5–15)
BUN: 16 mg/dL (ref 6–23)
CALCIUM: 9.2 mg/dL (ref 8.4–10.5)
CO2: 29 mEq/L (ref 19–32)
Chloride: 94 mEq/L — ABNORMAL LOW (ref 96–112)
Creatinine, Ser: 1.68 mg/dL — ABNORMAL HIGH (ref 0.50–1.35)
GFR calc Af Amer: 50 mL/min — ABNORMAL LOW (ref >90)
GFR calc non Af Amer: 43 mL/min — ABNORMAL LOW (ref >90)
Glucose, Bld: 131 mg/dL — ABNORMAL HIGH (ref 70–99)
Potassium: 4.6 mEq/L (ref 3.7–5.3)
SODIUM: 133 meq/L — AB (ref 137–147)
TOTAL PROTEIN: 7.3 g/dL (ref 6.0–8.3)
Total Bilirubin: 0.9 mg/dL (ref 0.3–1.2)

## 2014-02-23 LAB — GLUCOSE, CAPILLARY
GLUCOSE-CAPILLARY: 116 mg/dL — AB (ref 70–99)
GLUCOSE-CAPILLARY: 149 mg/dL — AB (ref 70–99)
Glucose-Capillary: 128 mg/dL — ABNORMAL HIGH (ref 70–99)
Glucose-Capillary: 134 mg/dL — ABNORMAL HIGH (ref 70–99)
Glucose-Capillary: 152 mg/dL — ABNORMAL HIGH (ref 70–99)
Glucose-Capillary: 212 mg/dL — ABNORMAL HIGH (ref 70–99)

## 2014-02-23 LAB — CBC WITH DIFFERENTIAL/PLATELET
BASOS PCT: 0 % (ref 0–1)
Basophils Absolute: 0 10*3/uL (ref 0.0–0.1)
EOS ABS: 0.1 10*3/uL (ref 0.0–0.7)
EOS PCT: 0 % (ref 0–5)
HCT: 41.9 % (ref 39.0–52.0)
Hemoglobin: 13.7 g/dL (ref 13.0–17.0)
Lymphocytes Relative: 8 % — ABNORMAL LOW (ref 12–46)
Lymphs Abs: 1.3 10*3/uL (ref 0.7–4.0)
MCH: 28.3 pg (ref 26.0–34.0)
MCHC: 32.7 g/dL (ref 30.0–36.0)
MCV: 86.6 fL (ref 78.0–100.0)
Monocytes Absolute: 1.1 10*3/uL — ABNORMAL HIGH (ref 0.1–1.0)
Monocytes Relative: 7 % (ref 3–12)
Neutro Abs: 12.9 10*3/uL — ABNORMAL HIGH (ref 1.7–7.7)
Neutrophils Relative %: 85 % — ABNORMAL HIGH (ref 43–77)
Platelets: 216 10*3/uL (ref 150–400)
RBC: 4.84 MIL/uL (ref 4.22–5.81)
RDW: 15.4 % (ref 11.5–15.5)
WBC: 15.3 10*3/uL — ABNORMAL HIGH (ref 4.0–10.5)

## 2014-02-23 MED ORDER — OXYCODONE HCL 5 MG PO TABS
5.0000 mg | ORAL_TABLET | ORAL | Status: DC | PRN
  Administered 2014-02-23: 5 mg via ORAL
  Administered 2014-02-23: 10 mg via ORAL
  Administered 2014-02-24 (×3): 5 mg via ORAL
  Administered 2014-02-24 – 2014-02-27 (×6): 10 mg via ORAL
  Filled 2014-02-23: qty 2
  Filled 2014-02-23: qty 1
  Filled 2014-02-23: qty 2
  Filled 2014-02-23: qty 1
  Filled 2014-02-23: qty 2
  Filled 2014-02-23: qty 1
  Filled 2014-02-23 (×5): qty 2

## 2014-02-23 NOTE — Progress Notes (Signed)
S:  Out of bed to chair today. No CP / SOB but sats still quite low. Tollerating PO w/o emesis.  O: AFVSS SNTND Foley out  Path T2N0Mx renal cell carcinoma two foci.  CXR - bibasalar atelectasis v. PNA  A/P:  1 - Discussed path report including large volume but node and margin negative encouraging.  2 - Pt only voids 2-3 x per day at baseline. Would not replace foley unless feels uncomfortable / bladder scan >600 or so.   3 - Explained call MD rounding on week but am in town if any acute issues arise.

## 2014-02-23 NOTE — Evaluation (Signed)
Physical Therapy Evaluation Patient Details Name: [name] MRN: [id number] DOB: 1955/11/16 Today's Date: 02/23/2014   History of Present Illness  58 yo male s/p radical nephrectomy, extensive lysis of adhesiolysis 02/21/14.   Clinical Impression  On eval, pt required Mod assist +2 for mobility-able to stand pivot from bed to recliner with 2 handheld assist. Increased time. Mobility limited by pain, fatigue. Pt is motivated to begin mobilizing.     Follow Up Recommendations Home health PT;Supervision/Assistance - 24 hour (depending on progress)    Equipment Recommendations   (to be determined)    Recommendations for Other Services OT consult     Precautions / Restrictions Precautions Precautions: Fall Precaution Comments: abdominal surgery. on NRB Restrictions Weight Bearing Restrictions: No      Mobility  Bed Mobility Overal bed mobility: Needs Assistance Bed Mobility: Supine to Sit     Supine to sit: HOB elevated;Min assist     General bed mobility comments: Assist for trunk to full upright. VCs safety.  Transfers Overall transfer level: Needs assistance Equipment used: 2 person hand held assist Transfers: Sit to/from [facility] Sit to Stand: Mod assist;+2 physical assistance;+2 safety/equipment Stand pivot transfers: Mod assist;+2 physical assistance;+2 safety/equipment       General transfer comment: assist to rise, stabilize, control descent. VCS safety, technique, hand placement. Stand pivot bed>recliner. Increased time. Remained on NRB  Ambulation/Gait                Stairs            Wheelchair Mobility    Modified Rankin (Stroke Patients Only)       Balance Overall balance assessment: Needs assistance Sitting-balance support: Bilateral upper extremity supported;Feet supported Sitting balance-Leahy Scale: Fair     Standing balance support: Bilateral upper extremity supported;During functional activity Standing  balance-Leahy Scale: Poor                               Pertinent Vitals/Pain Pain Assessment: 0-10 Pain Score: 5  Pain Location: abdomen Pain Intervention(s): Patient requesting pain meds-RN notified    Home Living Family/patient expects to be discharged to:: Private residence (pt planning to d/c to mother's home) Living Arrangements: Parent (to mother's home) Available Help at Discharge: Family Type of Home: House Home Access: Ramped entrance     Home Layout: One level Home Equipment: None      Prior Function Level of Independence: Independent               Hand Dominance        Extremity/Trunk Assessment   Upper Extremity Assessment: Generalized weakness           Lower Extremity Assessment: Generalized weakness      Cervical / Trunk Assessment: Normal  Communication   Communication: No difficulties  Cognition Arousal/Alertness: Lethargic;Suspect due to medications Behavior During Therapy: [facility] for tasks assessed/performed Overall Cognitive Status: Within Functional Limits for tasks assessed                      General Comments      Exercises        Assessment/Plan    PT Assessment Patient needs continued PT services  PT Diagnosis Difficulty walking;Generalized weakness;Acute pain   PT Problem List Decreased strength;Decreased range of motion;Decreased activity tolerance;Decreased mobility;Decreased balance;Decreased knowledge of use of DME;Pain  PT Treatment Interventions DME instruction;Gait training;Functional mobility training;Therapeutic activities;Patient/family education;Balance training  PT Goals (Current goals can be found in the Care Plan section) Acute Rehab PT Goals Patient Stated Goal: home. less pain. regain independence PT Goal Formulation: With patient/family Time For Goal Achievement: 03/09/14 Potential to Achieve Goals: Good    Frequency Min 3X/week   Barriers to discharge         Co-evaluation               End of Session   Activity Tolerance: Patient limited by fatigue;Patient limited by pain Patient left: in chair;with call bell/phone within reach;with family/visitor present;with nursing/sitter in room           Time: 4332-9518 PT Time Calculation (min): 11 min   Charges:   PT Evaluation $Initial PT Evaluation Tier I: 1 Procedure PT Treatments $Therapeutic Activity: 8-22 mins   PT G Codes:          Weston Anna, MPT Pager: [phone]

## 2014-02-23 NOTE — Progress Notes (Signed)
Placed on 15 liters/70% partial non-rebreather at 0745.  O2 sats continually dropped to 88-89% after patient went to sleep. Asymptomatic.  Placed back on 15/100% non-rebreather at 0908.

## 2014-02-23 NOTE — Plan of Care (Signed)
Problem: Phase II Progression Outcomes Goal: Hemodynamically stable Outcome: Progressing Still requiring 100% non-rebreather.

## 2014-02-23 NOTE — Progress Notes (Addendum)
Note: This document was prepared with digital dictation and possible smart phrase technology. Any transcriptional errors that result from this process are unintentional.   [name] VCB:[id number] DOB: August 09, 1955 DOA: 02/21/2014 PCP: [name], BETTY G, MD  Brief narrative: 58 y/o ?, known h/o CAD s/p PCI->LAD8/2014-most recent cath 12/20/13=non-obst disease, DM ty 2 since 2011, Htn, GAD, Cocaine use, recent cholectomy 12/18/13 admit, Prior TIA '90s admitted electively for R Nephrectomy,  Developed in PACU Acute hypoxic respiratory failure-noted + 4 liters fluids during procedure  Given IV lasix 40 mg x 1 and placed on CPAP  NGt placed as significant pneumoperitoneum  Past medical history-As per Problem list Chart reviewed as below-  Consultants:  None currently  Procedures:  None  Antibiotics:  Zosyn 8/6  Vancomycin 8/6   Subjective  [name] more alert and oriented For some reason placed back on [place] overnight Able to sit up and talk intelligibly right now Hungry + NGT d/c Abdomen slighlty tender but not as bad as prior.  No stool yet   Objective    Interim History: none  Telemetry: sinus   Objective: Filed Vitals:   02/23/14 0350 02/23/14 0400 02/23/14 0500 02/23/14 0600  BP: 127/69 115/96 125/65 114/67  Pulse: 76 76 75 75  Temp:  98.9 F (37.2 C)    TempSrc:  Oral    Resp: 21 18 17 14   Height:      Weight:  104.2 kg (229 lb 11.5 oz)    SpO2: 96% 97% 94% 97%    Intake/Output Summary (Last 24 hours) at 02/23/14 0743 Last data filed at 02/23/14 0600  Gross per 24 hour  Intake    550 ml  Output   4100 ml  Net  -3550 ml    Exam:  General: EOMI, NCAT Cardiovascular: S1-S2 no murmur rub or gallop Respiratory: Clinically clear no added sound Abdomen: Soft slightly distended nontender no rebound no guarding Skin no lower extremity edema Neuro grossly intact moving all 4 limbs equally  Data Reviewed: Basic Metabolic Panel:  Recent Labs Lab  02/22/14 0300 02/22/14 0955 02/23/14 0340  NA 134* 135* 133*  K 5.2 4.7 4.6  CL 99 96 94*  CO2 24 28 29   GLUCOSE 152* 144* 131*  BUN 14 14 16   CREATININE 1.62* 1.67* 1.68*  CALCIUM 8.9 9.2 9.2   Liver Function Tests:  Recent Labs Lab 02/22/14 0300 02/22/14 0955 02/23/14 0340  AST 81* 114* 103*  ALT 62* 86* 105*  ALKPHOS 82 86 79  BILITOT 0.4 0.6 0.9  PROT 7.0 7.3 7.3  ALBUMIN 3.1* 3.1* 3.0*   No results found for this basename: LIPASE, AMYLASE,  in the last 168 hours No results found for this basename: AMMONIA,  in the last 168 hours CBC:  Recent Labs Lab 02/21/14 1238 02/22/14 0047 02/22/14 0300 02/23/14 0340  WBC  --   --  12.9* 15.3*  NEUTROABS  --   --  11.0* 12.9*  HGB 13.8 13.1 13.2 13.7  HCT 42.5 40.2 40.8 41.9  MCV  --   --  86.8 86.6  PLT  --   --  213 216   Cardiac Enzymes: No results found for this basename: CKTOTAL, CKMB, CKMBINDEX, TROPONINI,  in the last 168 hours BNP: No components found with this basename: POCBNP,  CBG:  Recent Labs Lab 02/22/14 1624 02/22/14 1951 02/22/14 2343 02/23/14 0343 02/23/14 0734  GLUCAP 134* 123* 155* 128* 134*    Recent Results (from the past 240  hour(s))  MRSA PCR SCREENING     Status: None   Collection Time    02/22/14  3:00 AM      Result Value Ref Range Status   MRSA by PCR NEGATIVE  NEGATIVE Final   Comment:            The GeneXpert MRSA Assay (FDA     approved for NASAL specimens     only), is one component of a     comprehensive MRSA colonization     surveillance program. It is not     intended to diagnose MRSA     infection nor to guide or     monitor treatment for     MRSA infections.     Studies:              All Imaging reviewed and is as per above notation   Scheduled Meds: . antiseptic oral rinse  7 mL Mouth Rinse BID  . aspirin  81 mg Oral Daily  . atorvastatin  40 mg Oral q1800  . chlorhexidine  15 mL Mouth Rinse BID  . citalopram  40 mg Oral q morning - 10a  . diltiazem   240 mg Oral q morning - 10a  . docusate sodium  100 mg Oral BID  . FLUoxetine  10 mg Oral q morning - 10a  . furosemide  40 mg Oral Daily  . gabapentin  300 mg Oral QHS  . insulin aspart  0-9 Units Subcutaneous 6 times per day  . isosorbide mononitrate  60 mg Oral q morning - 10a  . lidocaine  1 patch Transdermal Daily  . metoprolol  5 mg Intravenous 4 times per day  . piperacillin-tazobactam (ZOSYN)  IV  3.375 [name] Intravenous 3 times per day  . senna  1 tablet Oral BID  . vancomycin  1,000 mg Intravenous BID   Continuous Infusions:    Assessment/Plan: 1. Toxic metabolic encephalopathy-probably secondary to effect of anesthetics, pain medications-clearing rapidly 2. Acute respiratory failure secondary to acute diastolic dysfunction-diuresed with IV Lasix x1 with very good result. Continue by mouth Lasix 40 daily. Repeat chest x-ray 8/7/a.m. -1.3 L so far for this admission. Echocardiogram to cousin 76 = EF65-70% c LVH  3. Possible pneumonia-continue empiric vancomycin/Zosyn-await chest x-ray.  Leukocytosis may be more post-op state and has had no fevers 4. History CAD-continue isosorbide 60 mg orally daily, aspirin 81 daily.  Keep on telemetry for now. 5. S/p Radical nephrectomy 02/21/14 -pathology to be discussed with patient by urologist pain control-have discontinued fentanyl IV every 4 when necessary continue oxycodone and increased of from 5 mg to try 10 mg every 4 when necessary  6.  depression-continue citalopram 40 daily, fluoxetine 10 daily 7. Type 2 diabetes mellitus-continue every 4 hourly coverage for now. Blood sugars reasonably controlled 128-150 8. Pneumoperitoneum seems to be resolving 9.  prior history of cocaine use-stable  Code Status:  full  Family Communication:  no family at bedside  Disposition Plan: Keep on stepped-down today. Await chest x-ray. CBC plus differential in a.m. might be able to discontinue antibiotic-would consider echocardiogram.  Patient to be up at  bedside today and ambulate with therapy    Kevin Griffes, MD  [facility] Pager [phone] 02/23/2014, 7:43 AM    LOS: 2 days

## 2014-02-23 NOTE — Progress Notes (Signed)
2 Days Post-Op  Subjective:  1 - Large Right Renal Mass - s/p robotic right radical nephrectomy and extensive adhesiolysis 02/21/14 for 8cm cystic / solid enhanicng renal mass, path pending. Pre-op staging wihtout adenopathy or chest lesions.    2 - Renal Insufficiency - Baseline Cr 1.0. Now s/p nephrectomy. Cr, K acceptable on daily labs thus far.  3 - Post-operative Hypoxia - Transferred to ICU 8/6 for progressive somnolence, hypoxia. Critical care consultation sought and began empiric therapy for possible aspiration and optimizing pulm parmaters (NG for gastric decompression, diuresis), Began non-rereather.   Today [name] is continuing to be more alert. C/o some mild incisional pain as expected. No CP or interval fevers. Diuresed well past 24 hrs. PT held yesterday unfortunately.   Objective: Vital signs in last 24 hours: Temp:  [97.8 F (36.6 C)-99 F (37.2 C)] 98.9 F (37.2 C) (08/07 0400) Pulse Rate:  [69-89] 75 (08/07 0600) Resp:  [14-31] 14 (08/07 0600) BP: (110-151)/(35-96) 114/67 mmHg (08/07 0600) SpO2:  [91 %-97 %] 97 % (08/07 0600) FiO2 (%):  [80 %] 80 % (08/07 0400) Weight:  [104.2 kg (229 lb 11.5 oz)] 104.2 kg (229 lb 11.5 oz) (08/07 0400) Last BM Date: 02/21/14  Intake/Output from previous day: 08/06 0701 - 08/07 0700 In: 550 [IV Piggyback:550] Out: 4100 [Urine:3200; Emesis/NG output:900] Intake/Output this shift:    General appearance: alert, cooperative, appears stated age and CPAP on Head: Normocephalic, without obvious abnormality, atraumatic Nose: NGT in place Throat: lips, mucosa, and tongue normal; teeth and gums normal Neck: supple, symmetrical, trachea midline Back: symmetric, no curvature. ROM normal. No CVA tenderness. Resp: non-labored on CPAP Cardio: Nl rate GI: soft, non-tender; bowel sounds normal; no masses,  no organomegaly Male genitalia: normal, foley c/d/i with clear urine. Extremities: extremities normal, atraumatic, no cyanosis or  edema Pulses: 2+ and symmetric Skin: Skin color, texture, turgor normal. No rashes or lesions Lymph nodes: Cervical, supraclavicular, and axillary nodes normal. Neurologic: Grossly normal Incision/Wound: recent port sites / extraction sites c/d/i.  Lab Results:   Recent Labs  02/22/14 0300 02/23/14 0340  WBC 12.9* 15.3*  HGB 13.2 13.7  HCT 40.8 41.9  PLT 213 216   BMET  Recent Labs  02/22/14 0955 02/23/14 0340  NA 135* 133*  K 4.7 4.6  CL 96 94*  CO2 28 29  GLUCOSE 144* 131*  BUN 14 16  CREATININE 1.67* 1.68*  CALCIUM 9.2 9.2   PT/INR  Recent Labs  02/22/14 0300  LABPROT 13.6  INR 1.04   ABG  Recent Labs  02/22/14 0146  PHART 7.348*  HCO3 24.8*    Studies/Results: Dg Abd 1 View  02/22/2014   CLINICAL DATA:  Abdominal distention and pain.  EXAM: ABDOMEN - 1 VIEW  COMPARISON:  12/20/2013  FINDINGS: No evidence of bowel obstruction. There is moderate gaseous distension of the stomach. No concerning intra-abdominal mass effect or calcification. Pneumoperitoneum visible along the inferior liver surface.  IMPRESSION: 1. Nonobstructive bowel gas pattern. Moderate gaseous distention of the stomach. 2. Pneumoperitoneum after recent laparoscopic abdominal surgery.   Electronically Signed   By: Jorje Guild M.D.   On: 02/22/2014 01:42   Dg Chest Port 1 View  02/22/2014   CLINICAL DATA:  Hypoxia and shortness of breath  EXAM: PORTABLE CHEST - 1 VIEW  COMPARISON:  02/14/2014  FINDINGS: Low lung volumes with bibasilar opacities that containing air bronchograms. Interstitial coarsening in the upper lungs, likely from interstitial crowding and atelectasis. No edema suspected. Likely no  cardiomegaly when considering the low lung volumes.  There is a moderate volume of pneumoperitoneum in the upper abdomen. Noted recent laparoscopic abdominal surgery.  IMPRESSION: 1. Low lung volumes with bibasilar atelectasis or aspiration. 2. Moderate pneumoperitoneum status post recent  laparoscopic abdominal surgery.   Electronically Signed   By: Jorje Guild M.D.   On: 02/22/2014 01:40   Dg Abd Portable 1v  02/22/2014   CLINICAL DATA:  Nasogastric tube placement.  EXAM: PORTABLE ABDOMEN - 1 VIEW  COMPARISON:  Abdominal sonogram February 22, 2014 at 1:23 a.m.  FINDINGS: Nasogastric fluid looped in proximal stomach with distal tip projecting at gastric antrum. Surgical clips in the included right abdomen likely reflect cholecystectomy. Nonspecific bowel gas pattern. Pneumoperitoneum consistent with recent surgery.  IMPRESSION: Nasogastric tube tip projects in gastric antrum.   Electronically Signed   By: Elon Alas   On: 02/22/2014 03:54    Anti-infectives: Anti-infectives   Start     Dose/Rate Route Frequency Ordered Stop   02/22/14 1800  vancomycin (VANCOCIN) IVPB 1000 mg/200 mL premix  Status:  Discontinued     1,000 mg 200 mL/hr over 60 Minutes Intravenous Every 12 hours 02/22/14 0938 02/22/14 0942   02/22/14 1400  piperacillin-tazobactam (ZOSYN) IVPB 3.375 g     3.375 g 12.5 mL/hr over 240 Minutes Intravenous 3 times per day 02/22/14 0534     02/22/14 1100  vancomycin (VANCOCIN) IVPB 1000 mg/200 mL premix     1,000 mg 200 mL/hr over 60 Minutes Intravenous 2 times daily 02/22/14 0942     02/22/14 0600  vancomycin (VANCOCIN) 1,250 mg in sodium chloride 0.9 % 250 mL IVPB  Status:  Discontinued     1,250 mg 166.7 mL/hr over 90 Minutes Intravenous Every 12 hours 02/22/14 0534 02/22/14 0938   02/22/14 0545  piperacillin-tazobactam (ZOSYN) IVPB 3.375 g     3.375 g 100 mL/hr over 30 Minutes Intravenous  Once 02/22/14 0534 02/22/14 0624   02/21/14 0707  ceFAZolin (ANCEF) IVPB 2 g/50 mL premix     2 g 100 mL/hr over 30 Minutes Intravenous 30 min pre-op 02/21/14 0707 02/21/14 0900      Assessment/Plan:  1 - Large Right Renal Mass - path pending for large clinically localized mass.   2 - Renal Insufficiency - Cr approx stable, which is re-assuring in setting of  strong ABX and diuresis following nephrectomy. Electrolytes in safe range.  3 - Post-operative Hypoxia - Appreciate PCCM help in management. Improving clinically.   DC Foley, DC NGT, Strongly encourage ambulation.   [facility], Jimy Gates 02/23/2014

## 2014-02-24 ENCOUNTER — Inpatient Hospital Stay (HOSPITAL_COMMUNITY): Payer: Medicaid Other

## 2014-02-24 DIAGNOSIS — J9600 Acute respiratory failure, unspecified whether with hypoxia or hypercapnia: Secondary | ICD-10-CM

## 2014-02-24 LAB — PRO B NATRIURETIC PEPTIDE: Pro B Natriuretic peptide (BNP): 234.9 pg/mL — ABNORMAL HIGH (ref 0–125)

## 2014-02-24 LAB — BASIC METABOLIC PANEL
ANION GAP: 11 (ref 5–15)
BUN: 19 mg/dL (ref 6–23)
CO2: 29 mEq/L (ref 19–32)
CREATININE: 1.68 mg/dL — AB (ref 0.50–1.35)
Calcium: 9.3 mg/dL (ref 8.4–10.5)
Chloride: 91 mEq/L — ABNORMAL LOW (ref 96–112)
GFR calc Af Amer: 50 mL/min — ABNORMAL LOW (ref >90)
GFR, EST NON AFRICAN AMERICAN: 43 mL/min — AB (ref >90)
Glucose, Bld: 141 mg/dL — ABNORMAL HIGH (ref 70–99)
Potassium: 4.5 mEq/L (ref 3.7–5.3)
Sodium: 131 mEq/L — ABNORMAL LOW (ref 137–147)

## 2014-02-24 LAB — GLUCOSE, CAPILLARY
GLUCOSE-CAPILLARY: 118 mg/dL — AB (ref 70–99)
GLUCOSE-CAPILLARY: 248 mg/dL — AB (ref 70–99)
Glucose-Capillary: 147 mg/dL — ABNORMAL HIGH (ref 70–99)
Glucose-Capillary: 177 mg/dL — ABNORMAL HIGH (ref 70–99)
Glucose-Capillary: 200 mg/dL — ABNORMAL HIGH (ref 70–99)

## 2014-02-24 LAB — SEDIMENTATION RATE: Sed Rate: 90 mm/hr — ABNORMAL HIGH (ref 0–16)

## 2014-02-24 MED ORDER — TIOTROPIUM BROMIDE MONOHYDRATE 18 MCG IN CAPS
18.0000 ug | ORAL_CAPSULE | Freq: Every day | RESPIRATORY_TRACT | Status: DC
  Administered 2014-02-24: 18 ug via RESPIRATORY_TRACT
  Filled 2014-02-24: qty 5

## 2014-02-24 MED ORDER — LEVOFLOXACIN 500 MG PO TABS
500.0000 mg | ORAL_TABLET | Freq: Every day | ORAL | Status: AC
  Administered 2014-02-24 – 2014-02-27 (×4): 500 mg via ORAL
  Filled 2014-02-24 (×4): qty 1

## 2014-02-24 MED ORDER — INSULIN ASPART 100 UNIT/ML ~~LOC~~ SOLN
0.0000 [IU] | Freq: Three times a day (TID) | SUBCUTANEOUS | Status: DC
  Administered 2014-02-24: 2 [IU] via SUBCUTANEOUS
  Administered 2014-02-24: 1 [IU] via SUBCUTANEOUS
  Administered 2014-02-24 – 2014-02-25 (×3): 2 [IU] via SUBCUTANEOUS
  Administered 2014-02-25: 5 [IU] via SUBCUTANEOUS
  Administered 2014-02-26: 1 [IU] via SUBCUTANEOUS

## 2014-02-24 MED ORDER — PREDNISONE 20 MG PO TABS
60.0000 mg | ORAL_TABLET | Freq: Every day | ORAL | Status: DC
  Administered 2014-02-24 – 2014-02-26 (×3): 60 mg via ORAL
  Filled 2014-02-24 (×3): qty 3

## 2014-02-24 MED ORDER — ALBUTEROL SULFATE (2.5 MG/3ML) 0.083% IN NEBU
2.5000 mg | INHALATION_SOLUTION | Freq: Four times a day (QID) | RESPIRATORY_TRACT | Status: DC
  Administered 2014-02-24: 2.5 mg via RESPIRATORY_TRACT
  Filled 2014-02-24: qty 3

## 2014-02-24 MED ORDER — FLUTICASONE PROPIONATE HFA 220 MCG/ACT IN AERO
2.0000 | INHALATION_SPRAY | Freq: Two times a day (BID) | RESPIRATORY_TRACT | Status: DC
Start: 2014-02-24 — End: 2014-02-24
  Administered 2014-02-24: 2 via RESPIRATORY_TRACT
  Filled 2014-02-24: qty 12

## 2014-02-24 MED ORDER — FUROSEMIDE 40 MG PO TABS
40.0000 mg | ORAL_TABLET | Freq: Every day | ORAL | Status: DC
  Administered 2014-02-24 – 2014-02-27 (×4): 40 mg via ORAL
  Filled 2014-02-24 (×4): qty 1

## 2014-02-24 MED ORDER — IPRATROPIUM BROMIDE 0.02 % IN SOLN
0.5000 mg | Freq: Four times a day (QID) | RESPIRATORY_TRACT | Status: DC
  Administered 2014-02-24 – 2014-02-25 (×2): 0.5 mg via RESPIRATORY_TRACT
  Filled 2014-02-24 (×2): qty 2.5

## 2014-02-24 MED ORDER — ALBUTEROL SULFATE (2.5 MG/3ML) 0.083% IN NEBU
2.5000 mg | INHALATION_SOLUTION | Freq: Four times a day (QID) | RESPIRATORY_TRACT | Status: DC
  Administered 2014-02-24 – 2014-02-25 (×2): 2.5 mg via RESPIRATORY_TRACT
  Filled 2014-02-24 (×2): qty 3

## 2014-02-24 MED ORDER — ALBUTEROL SULFATE (2.5 MG/3ML) 0.083% IN NEBU: 2.5000 mg | INHALATION_SOLUTION | RESPIRATORY_TRACT | Status: DC | PRN

## 2014-02-24 NOTE — Progress Notes (Signed)
eLink Physician-Brief Progress Note Patient Name: TOWNSEND CUDWORTH DOB: 02-08-1956 MRN: [id number]  Date of Service  02/24/2014   HPI/Events of Note   Nursing requesting ICU status  eICU Interventions  Change to icu status    Intervention Category Major Interventions: Respiratory failure - evaluation and management  Christinia Gully 02/24/2014, 4:49 PM

## 2014-02-24 NOTE — Progress Notes (Signed)
Patient ID: [name], male   DOB: Aug 01, 1955, 58 y.o.   MRN: [id number]  Name: [name] MRN: [id number] DOB: Jun 27, 1956  ELECTRONIC ICU PHYSICIAN NOTE  Problem:  resp failure post op   Intake/Output Summary (Last 24 hours) at 02/24/14 1554 Last data filed at 02/24/14 1544  Gross per 24 hour  Intake 1007.5 ml  Output   2800 ml  Net -1792.5 ml     Intervention:  Stat cxr/ bnp/esr/ pccm physician to see (Dr Halford Chessman)  Christinia Gully 02/24/2014, 3:54 PM

## 2014-02-24 NOTE — Consult Note (Signed)
PULMONARY / CRITICAL CARE MEDICINE   Name: [name] MRN: [id number] DOB: [birth year]    ADMISSION DATE:  02/21/2014 CONSULTATION DATE:  02/21/2014  REFERRING MD :  Lynne Logan  CHIEF COMPLAINT:  Hypoxia  INITIAL PRESENTATION:  58 yo male smoker with Rt renal mass and had Rt nephrectomy .  He developed hypoxia post-op and transferred to ICU.  He has PMHx of CAD s/p stent, DM type II, COPD, substance abuse, recurrent PNA.  STUDIES:   SIGNIFICANT EVENTS: 8/05 Rt nephrectomy 8/06 Lethargic, hypoxia, transfer to SDU, [facility] consulted  HISTORY OF PRESENT ILLNESS:  58 yo male smoker with hx of COPD had rt nephrectomy on 8/05.  He had problems with lethargy and hypoxia after surgery.  Mental status has improved, but he still requires supplemental oxygen.  He has been given lasix and had good diuresis >> not much improvement in oxygenation.  He had abdominal distention after surgery and had NG tube to decompression abdomen >> again not much improvement in oxygenation.  He was tried on BiPAP, but had difficulty tolerating this.  He has some chest congestion.  He denies wheeze or sputum.  He has not noticed chest pain.  There is no history of thrombo-embolic disease.  He denies leg swelling.  He does not wear oxygen at home.  He has frequent episodes of pneumonia.  He c/o discomfort in his lower chest/upper abdomen on the Rt with deep breath and cough.  PAST MEDICAL HISTORY :  Past Medical History  Diagnosis Date  . Essential hypertension, benign   . TIA (transient ischemic attack) 1997    No residual neurological deficits.  . Noncompliance   . COPD (chronic obstructive pulmonary disease)   . Hyperlipidemia   . History of pneumonia   . Type 2 diabetes mellitus 2011  . GERD (gastroesophageal reflux disease)   . Substance abuse   . Cocaine abuse     02-14-14 dicusses freely with medical team, prefers no discussions with family present.  . Renal mass, right     surgery planned 02-21-14   . Cholecystitis, acute 12/20/2013    S/p Lap chole 6/5  . Coronary atherosclerosis of native coronary artery     a. 03/09/2013 Cath/PCI: LM nl, LAD: 50p, 72m (2.5x16 promus DES), LCX nl, OM1 min irregs, LPL/LPDA diff dzs, RCA nondom, mod diff dzs, EF 55%.  . Anginal pain     has two small heart arteries with blockage.  . Shortness of breath     with exertion always.  . Depression   . Anxiety   . History of kidney stones     several times in the 90's  . Arthritis     arm -right and both legs  . Cancer     right kidney cancer dx. recent  . Hepatitis Late 1970s    ?Hep. C-Treated in the hospital, finished treatment course. No recurrence.   Past Surgical History  Procedure Laterality Date  . Appendectomy  1970's  . Cholecystectomy N/A 12/22/2013    Procedure: LAPAROSCOPIC CHOLECYSTECTOMY ;  Surgeon: [name], MD;  Location: [place];  Service: General;  Laterality: N/A;  . Coronary stent placement    . Cardiac catheterization      6'15 -cardiac cath" found 2 small vessels with some blockage - vessels too small for stents.  . Robot assisted laparoscopic nephrectomy Right 02/21/2014    Procedure: ROBOTIC ASSISTED LAPAROSCOPIC RIGHT NEPHRECTOMY ;  Surgeon: [name], MD;  Location: [facility];  Service:  Urology;  Laterality: Right;  . Laparoscopic lysis of adhesions  02/21/2014    Procedure: LAPAROSCOPIC LYSIS OF ADHESIONS EXTINSIVE;  Surgeon: [name], MD;  Location: [facility];  Service: Urology;;   Prior to Admission medications   Medication Sig Start Date End Date Taking? Authorizing Provider  citalopram (CELEXA) 40 MG tablet Take 40 mg by mouth every morning.   Yes Historical Provider, MD  diltiazem (CARDIZEM CD) 240 MG 24 hr capsule Take 240 mg by mouth every morning.   Yes Historical Provider, MD  fish oil-omega-3 fatty acids 1000 MG capsule Take 1 g by mouth 4 (four) times daily.   Yes Historical Provider, MD  FLUoxetine (PROZAC) 10 MG capsule Take 10 mg by mouth every  morning.   Yes Historical Provider, MD  gabapentin (NEURONTIN) 300 MG capsule Take 300 mg by mouth at bedtime.   Yes Historical Provider, MD  glipiZIDE (GLUCOTROL) 10 MG tablet Take 10 mg by mouth 2 (two) times daily.   Yes Historical Provider, MD  ipratropium-albuterol (DUONEB) 0.5-2.5 (3) MG/3ML SOLN Take 3 mLs by nebulization every 6 (six) hours as needed (Shortness of breath).   Yes Historical Provider, MD  isosorbide mononitrate (IMDUR) 60 MG 24 hr tablet Take 60 mg by mouth every morning.    Yes Historical Provider, MD  lisinopril (PRINIVIL,ZESTRIL) 5 MG tablet Take 5 mg by mouth 2 (two) times daily.   Yes Historical Provider, MD  nitroGLYCERIN (NITROSTAT) 0.4 MG SL tablet Place 1 tablet (0.4 mg total) under the tongue every 5 (five) minutes as needed for chest pain. 06/03/12  Yes Ivor Costa, MD  ranitidine (ZANTAC) 300 MG tablet Take 300 mg by mouth every morning.    Yes Historical Provider, MD  rosuvastatin (CRESTOR) 20 MG tablet Take 20 mg by mouth at bedtime.   Yes Historical Provider, MD  sitaGLIPtin (JANUVIA) 100 MG tablet Take 100 mg by mouth every morning.    Yes Historical Provider, MD  aspirin 81 MG chewable tablet Chew 1 tablet (81 mg total) by mouth daily. 11/05/12   Reyne Dumas, MD  clopidogrel (PLAVIX) 75 MG tablet Take 1 tablet (75 mg total) by mouth daily with breakfast. 06/03/12   Ivor Costa, MD  HYDROcodone-acetaminophen (NORCO) 5-325 MG per tablet Take 1-2 tablets by mouth every 6 (six) hours as needed. 02/21/14   Marcie Bal, PA-C   No Known Allergies  FAMILY HISTORY:  Family History  Problem Relation Age of Onset  . Hypertension    . Diabetes    . Stroke    . Lung cancer Father   . Cancer Mother     Thyroid - living in her 33's.  . Cancer Maternal Grandmother     Breast  . Cancer Maternal Grandfather     Throat and stomach  . CAD Father   . CAD Brother    SOCIAL HISTORY:  reports that he has been smoking Cigarettes.  He has a 20.5 pack-year smoking  history. He has never used smokeless tobacco. He reports that he does not drink alcohol or use illicit drugs.  REVIEW OF SYSTEMS:   Negative except above.  SUBJECTIVE:   VITAL SIGNS: Temp:  [97.6 F (36.4 C)-98.1 F (36.7 C)] 98.1 F (36.7 C) (08/08 1600) Pulse Rate:  [76-92] 92 (08/08 1544) Resp:  [12-27] 12 (08/08 1710) BP: (103-133)/(64-81) 117/68 mmHg (08/08 1544) SpO2:  [77 %-95 %] 90 % (08/08 1710) FiO2 (%):  [50 %-100 %] 100 % (08/08 1710) HEMODYNAMICS:   VENTILATOR SETTINGS:  Vent Mode:  [-]  FiO2 (%):  [50 %-100 %] 100 % INTAKE / OUTPUT:  Intake/Output Summary (Last 24 hours) at 02/24/14 1728 Last data filed at 02/24/14 1544  Gross per 24 hour  Intake  982.5 ml  Output   2800 ml  Net -1817.5 ml    PHYSICAL EXAMINATION: General: pleasant, no distress Neuro:  Alert, normal strength, CN intact HEENT: Pupils reactive, no sinus tenderness, JVD Cardiovascular:  Regular, no murmur Lungs:  Decreased breath sounds, faint basilar rales, no wheeze, decreased respiratory excursion Abdomen:  Soft, mild distention, wounds sites clean, decreased bowel sounds Musculoskeletal:  No edema Skin:  No rashes  LABS:  CBC  Recent Labs Lab 02/22/14 0047 02/22/14 0300 02/23/14 0340  WBC  --  12.9* 15.3*  HGB 13.1 13.2 13.7  HCT 40.2 40.8 41.9  PLT  --  213 216   Coag's  Recent Labs Lab 02/22/14 0300  INR 1.04   BMET  Recent Labs Lab 02/22/14 0955 02/23/14 0340 02/24/14 0718  NA 135* 133* 131*  K 4.7 4.6 4.5  CL 96 94* 91*  CO2 28 29 29   BUN 14 16 19   CREATININE 1.67* 1.68* 1.68*  GLUCOSE 144* 131* 141*   Electrolytes  Recent Labs Lab 02/22/14 0955 02/23/14 0340 02/24/14 0718  CALCIUM 9.2 9.2 9.3   Sepsis Markers  Recent Labs Lab 02/22/14 0300  LATICACIDVEN 0.8   ABG  Recent Labs Lab 02/22/14 0146  PHART 7.348*  PCO2ART 46.3*  PO2ART 51.4*   Liver Enzymes  Recent Labs Lab 02/22/14 0300 02/22/14 0955 02/23/14 0340  AST 81* 114*  103*  ALT 62* 86* 105*  ALKPHOS 82 86 79  BILITOT 0.4 0.6 0.9  ALBUMIN 3.1* 3.1* 3.0*   Cardiac Enzymes  Recent Labs Lab 02/24/14 1620  PROBNP 234.9*   Glucose  Recent Labs Lab 02/23/14 1221 02/23/14 1627 02/23/14 2021 02/23/14 2342 02/24/14 0347 02/24/14 0750  GLUCAP 212* 116* 149* 152* 118* 147*    Imaging Dg Chest 2 View  02/23/2014   CLINICAL DATA:  Pneumonia, shortness of Breath  EXAM: CHEST  2 VIEW  COMPARISON:  02/22/2014  FINDINGS: Cardiomegaly again noted. Study is limited by poor inspiration. Persistent bilateral basilar atelectasis or infiltrate. No pulmonary edema. Again noted pneumoperitoneum.  IMPRESSION: Persistent bilateral basilar atelectasis or infiltrate. No pulmonary edema. Limited study by poor inspiration.   Electronically Signed   By: Lahoma Crocker M.D.   On: 02/23/2014 10:43     ASSESSMENT / PLAN:  PULMONARY A: Acute hypoxic respiratory failure >> likely from combination of hypoventilation from decreased abdominal compliance, atelectasis, and hx of COPD. Tobacco abuse. P:   Adjust oxygen to keep SpO2 > 88% F/u CXR F/u Echo >> if RV pressures high, then consider further evaluation for thromboembolic disease Scheduled BD's Continue prednisone Bronchial hygiene BiPAP prn as tolerated  CARDIOVASCULAR A:  Hx of CAD, HTN, hyperlipidemia. P:  Continue ASA, lipitor, cardizem, lasix, imdur  RENAL A:   Rt renal mass s/p nephrectomy. P:   Monitor renal fx, urine outpt, electrolytes Post-op care per urology  GASTROINTESTINAL A:   Abdominal distention after surgery. Hx of GERD. Reported hx of Hep C. P:   F/u LFT's Bowel regimen  HEMATOLOGIC A:   Leukocytosis >> likely from steroids. P:  F/u CBC  INFECTIOUS A:   ?PNA with hx of recurrent PNA. P:   Day 4 Abx, currently on levaquin Check HIV  ENDOCRINE A:   Steroid induced hyperglycemia. P:  SSI  NEUROLOGIC A:   Hx of Depression, Anxiety. Post-op pain control. Hx of  cocaine abuse. P:   Continue celexa, prozac, neurontin, lidoderm patch  CC time 40 minutes.  Chesley Mires, MD [facility] Pulmonary/Critical Care 02/24/2014, 5:59 PM Pager:  [phone] After 3pm call: [phone]

## 2014-02-24 NOTE — Progress Notes (Signed)
Patient ID: [name], male   DOB: [birth year], 58 y.o.   MRN: [id number]  3 Days Post-Op Subjective: He has been tolerating diet and is eating full liquids this morning.  No nausea or vomiting since NG tube removed yesterday morning. He subjectively has not been short of breath but has continued to require high oxygen requirements. It is felt that he may have chronic hypoxia although he continues to be treated empirically for fluid overload and pneumonia. He did require I and O catheterization last night for over 1 L urine.  He voiding 250 cc this morning.  Objective: Vital signs in last 24 hours: Temp:  [97.6 F (36.4 C)-98.3 F (36.8 C)] 97.6 F (36.4 C) (08/08 0800) Pulse Rate:  [76-92] 77 (08/08 0800) Resp:  [14-30] 17 (08/08 0800) BP: (87-123)/(43-79) 122/66 mmHg (08/08 0800) SpO2:  [88 %-95 %] 88 % (08/08 0800) FiO2 (%):  [70 %-100 %] 100 % (08/07 1800)  Intake/Output from previous day: 08/07 0701 - 08/08 0700 In: 1380 [P.O.:820; I.V.:60; IV Piggyback:500] Out: 1660 [Urine:1460; Emesis/NG output:200] Intake/Output this shift: Total I/O In: -  Out: 120 [Urine:120]  Physical Exam:  General: Alert and oriented CV: RRR Lungs: Clear Abdomen: Soft, ND, NT, Positive BS Incisions: C/D/I  Lab Results:  Recent Labs  02/22/14 0047 02/22/14 0300 02/23/14 0340  HGB 13.1 13.2 13.7  HCT 40.2 40.8 41.9   BMET  Recent Labs  02/23/14 0340 02/24/14 0718  NA 133* 131*  K 4.6 4.5  CL 94* 91*  CO2 29 29  GLUCOSE 131* 141*  BUN 16 19  CREATININE 1.68* 1.68*  CALCIUM 9.2 9.3     Studies/Results: Dg Chest 2 View  02/23/2014   CLINICAL DATA:  Pneumonia, shortness of Breath  EXAM: CHEST  2 VIEW  COMPARISON:  02/22/2014  FINDINGS: Cardiomegaly again noted. Study is limited by poor inspiration. Persistent bilateral basilar atelectasis or infiltrate. No pulmonary edema. Again noted pneumoperitoneum.  IMPRESSION: Persistent bilateral basilar atelectasis or infiltrate. No  pulmonary edema. Limited study by poor inspiration.   Electronically Signed   By: Lahoma Crocker M.D.   On: 02/23/2014 10:43    Assessment/Plan: - Advance diet - Monitor PVRs and follow renal function - Ambulate, IS - Once pulmonary issues are stabilized, will transfer to the floor   LOS: 3 days   Neng Albee,LES 02/24/2014, 8:44 AM

## 2014-02-24 NOTE — Progress Notes (Addendum)
4235 voided 120 ml clear, yellow urine into urinal.  0955 Bladder scan shows 819 ml.  Patient denies needing to urinate and does not want to attempt at this time.  Will continue to monitor.   1200 voided 650 ml clear, yellow urine in the urinal.  1210 Bladder scan volume 22 ml.  1430 voided 300 ml clear, yellow urine in the urinal.  1435 Bladder scan volume 19 ml.

## 2014-02-24 NOTE — Progress Notes (Signed)
Note: This document was prepared with digital dictation and possible smart phrase technology. Any transcriptional errors that result from this process are unintentional.   [name] TDV:[id number] DOB: 1956-01-25 DOA: 02/21/2014 PCP: [name], [name]  Brief narrative: 58 y/o ?, known h/o CAD s/p PCI->LAD8/2014-most recent cath 12/20/13=non-obst disease, DM ty 2 since 2011, Htn, GAD, Cocaine use, recent cholectomy 12/18/13 admit, Prior TIA '90s admitted electively for R Nephrectomy,  Developed in PACU Acute hypoxic respiratory failure-noted + 4 liters fluids during procedure  Given IV lasix 40 mg x 1 and placed on CPAP  NGt placed as significant pneumoperitoneum  Past medical history-As per Problem list Chart reviewed as below-  Consultants:  None currently  Procedures:  None  Antibiotics:  Zosyn 8/6-8/8  Vancomycin 8/68/8  Levaquin 8/8   Subjective    sitting in chair desat to 80's with no NRB Pain coughing and with deep breaths  Using incentive spirometer currently No CP otherwise NO swelling anywhere No wheeze Pain 7/10    Objective    Interim History: none  Telemetry: sinus   Objective: Filed Vitals:   02/23/14 1800 02/23/14 2100 02/24/14 0025 02/24/14 0416  BP:  119/77 116/64 103/66  Pulse:   76 76  Temp:    97.9 F (36.6 C)  TempSrc:    Oral  Resp: 14  16 27   Height:      Weight:      SpO2: 95%  91% 93%    Intake/Output Summary (Last 24 hours) at 02/24/14 0735 Last data filed at 02/24/14 0532  Gross per 24 hour  Intake   1380 ml  Output   1460 ml  Net    -80 ml    Exam:  General: EOMI, NCAT Cardiovascular: S1-S2 no murmur rub or gallop, no changed Respiratory: Clinically clear no added sound Abdomen: Soft slightly distended nontender no rebound no guarding Skin no lower extremity edema Neuro grossly intact moving all 4 limbs equally-alert  Data Reviewed: Basic Metabolic Panel:  Recent Labs Lab 02/22/14 0300  02/22/14 0955 02/23/14 0340  NA 134* 135* 133*  K 5.2 4.7 4.6  CL 99 96 94*  CO2 24 28 29   GLUCOSE 152* 144* 131*  BUN 14 14 16   CREATININE 1.62* 1.67* 1.68*  CALCIUM 8.9 9.2 9.2   Liver Function Tests:  Recent Labs Lab 02/22/14 0300 02/22/14 0955 02/23/14 0340  AST 81* 114* 103*  ALT 62* 86* 105*  ALKPHOS 82 86 79  BILITOT 0.4 0.6 0.9  PROT 7.0 7.3 7.3  ALBUMIN 3.1* 3.1* 3.0*   No results found for this basename: LIPASE, AMYLASE,  in the last 168 hours No results found for this basename: AMMONIA,  in the last 168 hours CBC:  Recent Labs Lab 02/21/14 1238 02/22/14 0047 02/22/14 0300 02/23/14 0340  WBC  --   --  12.9* 15.3*  NEUTROABS  --   --  11.0* 12.9*  HGB 13.8 13.1 13.2 13.7  HCT 42.5 40.2 40.8 41.9  MCV  --   --  86.8 86.6  PLT  --   --  213 216   Cardiac Enzymes: No results found for this basename: CKTOTAL, CKMB, CKMBINDEX, TROPONINI,  in the last 168 hours BNP: No components found with this basename: POCBNP,  CBG:  Recent Labs Lab 02/23/14 1221 02/23/14 1627 02/23/14 2021 02/23/14 2342 02/24/14 0347  GLUCAP 212* 116* 149* 152* 118*    Recent Results (from the past 240 hour(s))  MRSA PCR SCREENING  Status: None   Collection Time    02/22/14  3:00 AM      Result Value Ref Range Status   MRSA by PCR NEGATIVE  NEGATIVE Final   Comment:            The GeneXpert MRSA Assay (FDA     approved for NASAL specimens     only), is one component of a     comprehensive MRSA colonization     surveillance program. It is not     intended to diagnose MRSA     infection nor to guide or     monitor treatment for     MRSA infections.     Studies:              All Imaging reviewed and is as per above notation   Scheduled Meds: . antiseptic oral rinse  7 mL Mouth Rinse BID  . aspirin  81 mg Oral Daily  . atorvastatin  40 mg Oral q1800  . chlorhexidine  15 mL Mouth Rinse BID  . citalopram  40 mg Oral q morning - 10a  . diltiazem  240 mg Oral q  morning - 10a  . docusate sodium  100 mg Oral BID  . FLUoxetine  10 mg Oral q morning - 10a  . gabapentin  300 mg Oral QHS  . insulin aspart  0-9 Units Subcutaneous 6 times per day  . isosorbide mononitrate  60 mg Oral q morning - 10a  . lidocaine  1 patch Transdermal Daily  . piperacillin-tazobactam (ZOSYN)  IV  3.375 [name] Intravenous 3 times per day  . senna  1 tablet Oral BID  . vancomycin  1,000 mg Intravenous BID   Continuous Infusions:    Assessment/Plan: 1. Toxic metabolic encephalopathy-probably secondary to effect of anesthetics, pain medications-clearing rapidly.  Minimize narcotics 2. Acute respiratory failure secondary to acute diastolic dysfunction-diuresed with IV Lasix x1 with very good result. . Repeat chest x-ray 8/7a.m.= Atelectasis/PNA -1.655 L so far for this admission. Echocardiogram 2013 = EF 65-70% c LVH-repeat echo today, add lasix 40 mg po once today and add prednisone 60 mg daily today 02/24/14 as well.   Will ask pulmonary to see patient to optimize management  3. Possible pneumonia-continue empiric vancomycin/Zosyn-await chest x-ray.  Leukocytosis may be more post-op state and has had no fevers.  I will narrow to PO levaquin 8/8 4. History CAD-continue isosorbide 60 mg orally daily, aspirin 81 daily.  Keep on telemetry for now. 5. S/p Radical nephrectomy 02/21/14 -pathology to be discussed with patient by urologist pain control-have discontinued fentanyl IV every 4 when necessary continue oxycodone and increased of from 5 mg to 10 mg every 4 when necessary  6. Depression-continue citalopram 40 daily, fluoxetine 10 daily 7. Type 2 diabetes mellitus-continue every 4 hourly coverage for now. Blood sugars reasonably controlled  147-152-start reg det as tol clears 8. Pneumoperitoneum seems to be resolving 9.  prior history of cocaine use-stable 10. Elevated LFT's-unclear etiology-consider OP work-up 11. COPD-current daily smoker 1/2 ppd.  Optimize COPD regimen with addition  of Flovent 22 mcg bid, Tiotropium 18 bid and split duoneb component to Albuterol nebs-no wheeze but possible component  Code Status:  full  Family Communication:  no family at bedside  Disposition Plan: sdu today tele ? am   [name], [name]  [facility] Pager [phone] 02/24/2014, 7:35 AM    LOS: 3 days

## 2014-02-25 ENCOUNTER — Inpatient Hospital Stay (HOSPITAL_COMMUNITY): Payer: Medicaid Other

## 2014-02-25 DIAGNOSIS — R609 Edema, unspecified: Secondary | ICD-10-CM

## 2014-02-25 DIAGNOSIS — I517 Cardiomegaly: Secondary | ICD-10-CM

## 2014-02-25 LAB — CBC WITH DIFFERENTIAL/PLATELET
BASOS PCT: 0 % (ref 0–1)
Basophils Absolute: 0 10*3/uL (ref 0.0–0.1)
EOS ABS: 0 10*3/uL (ref 0.0–0.7)
EOS PCT: 0 % (ref 0–5)
HEMATOCRIT: 38.7 % — AB (ref 39.0–52.0)
Hemoglobin: 12.6 g/dL — ABNORMAL LOW (ref 13.0–17.0)
Lymphocytes Relative: 7 % — ABNORMAL LOW (ref 12–46)
Lymphs Abs: 0.8 10*3/uL (ref 0.7–4.0)
MCH: 27.9 pg (ref 26.0–34.0)
MCHC: 32.6 g/dL (ref 30.0–36.0)
MCV: 85.6 fL (ref 78.0–100.0)
MONO ABS: 0.8 10*3/uL (ref 0.1–1.0)
Monocytes Relative: 6 % (ref 3–12)
Neutro Abs: 11.1 10*3/uL — ABNORMAL HIGH (ref 1.7–7.7)
Neutrophils Relative %: 87 % — ABNORMAL HIGH (ref 43–77)
Platelets: 222 10*3/uL (ref 150–400)
RBC: 4.52 MIL/uL (ref 4.22–5.81)
RDW: 14.5 % (ref 11.5–15.5)
WBC: 12.8 10*3/uL — ABNORMAL HIGH (ref 4.0–10.5)

## 2014-02-25 LAB — GLUCOSE, CAPILLARY
GLUCOSE-CAPILLARY: 171 mg/dL — AB (ref 70–99)
GLUCOSE-CAPILLARY: 168 mg/dL — AB (ref 70–99)
GLUCOSE-CAPILLARY: 266 mg/dL — AB (ref 70–99)
Glucose-Capillary: 245 mg/dL — ABNORMAL HIGH (ref 70–99)
Glucose-Capillary: 180 mg/dL — ABNORMAL HIGH (ref 70–99)

## 2014-02-25 LAB — COMPREHENSIVE METABOLIC PANEL
ALBUMIN: 2.8 g/dL — AB (ref 3.5–5.2)
ALK PHOS: 82 U/L (ref 39–117)
ALT: 52 U/L (ref 0–53)
AST: 22 U/L (ref 0–37)
Anion gap: 10 (ref 5–15)
BUN: 27 mg/dL — ABNORMAL HIGH (ref 6–23)
CHLORIDE: 94 meq/L — AB (ref 96–112)
CO2: 33 mEq/L — ABNORMAL HIGH (ref 19–32)
Calcium: 9.7 mg/dL (ref 8.4–10.5)
Creatinine, Ser: 1.87 mg/dL — ABNORMAL HIGH (ref 0.50–1.35)
GFR calc non Af Amer: 38 mL/min — ABNORMAL LOW (ref >90)
GFR, EST AFRICAN AMERICAN: 44 mL/min — AB (ref >90)
GLUCOSE: 185 mg/dL — AB (ref 70–99)
POTASSIUM: 4.7 meq/L (ref 3.7–5.3)
SODIUM: 137 meq/L (ref 137–147)
TOTAL PROTEIN: 7.7 g/dL (ref 6.0–8.3)
Total Bilirubin: 0.4 mg/dL (ref 0.3–1.2)

## 2014-02-25 LAB — HEPATITIS C ANTIBODY: HCV Ab: NEGATIVE

## 2014-02-25 LAB — HIV ANTIBODY (ROUTINE TESTING W REFLEX): HIV 1&2 Ab, 4th Generation: NONREACTIVE

## 2014-02-25 MED ORDER — IPRATROPIUM-ALBUTEROL 0.5-2.5 (3) MG/3ML IN SOLN
3.0000 mL | Freq: Four times a day (QID) | RESPIRATORY_TRACT | Status: DC
  Administered 2014-02-25 – 2014-02-27 (×8): 3 mL via RESPIRATORY_TRACT
  Filled 2014-02-25 (×8): qty 3

## 2014-02-25 NOTE — Progress Notes (Signed)
*  PRELIMINARY RESULTS* Vascular Ultrasound Lower extremity venous duplex has been completed.  Preliminary findings: no evidence of DVT  Landry Mellow, RDMS, RVT  02/25/2014, 3:23 PM

## 2014-02-25 NOTE — Consult Note (Signed)
PULMONARY / CRITICAL CARE MEDICINE   Name: BRODYN DEPUY MRN: [id number] DOB: 1955/11/22    ADMISSION DATE:  02/21/2014 CONSULTATION DATE:  02/21/2014  REFERRING MD :  Lynne Logan  CHIEF COMPLAINT:  Hypoxia  INITIAL PRESENTATION:  58 yo male smoker with hx of COPD had rt nephrectomy on 8/05 (eventual dx T2NoM0 renal cell Ca).  He had problems with lethargy and hypoxia after surgery.  Mental status has improved, but he still requires supplemental oxygen.  He has been given lasix and had good diuresis >> not much improvement in oxygenation.  He had abdominal distention after surgery and had NG tube to decompression abdomen >> again not much improvement in oxygenation.  He was tried on BiPAP, but had difficulty tolerating this.  He has some chest congestion.  He denies wheeze or sputum.  He has not noticed chest pain.  There is no history of thrombo-embolic disease.  He denies leg swelling.  He does not wear oxygen at home.  He has frequent episodes of pneumonia.  He c/o discomfort in his lower chest/upper abdomen on the Rt with deep breath and cough.   SIGNIFICANT EVENTS: 8/05 Rt nephrectomy 8/06 Lethargic, hypoxia, transfer to SDU, [facility] 02/24/14 - PCCM consllt  SUBJECTIVE:   02/25/14: Still with easy desaturation. Did not wear bipap last night. Prednisone, lasix, and pulmonary toilet continue without improvement  VITAL SIGNS: Temp:  [97.4 F (36.3 C)-98.2 F (36.8 C)] 97.4 F (36.3 C) (08/09 0800) Pulse Rate:  [72-92] 72 (08/09 0800) Resp:  [10-23] 23 (08/09 0800) BP: (102-142)/(63-81) 142/70 mmHg (08/09 0800) SpO2:  [77 %-93 %] 93 % (08/09 0800) FiO2 (%):  [50 %-100 %] 100 % (08/09 0846) Weight:  [232 lb 9.4 oz (105.5 kg)] 232 lb 9.4 oz (105.5 kg) (08/09 0400) HEMODYNAMICS:   VENTILATOR SETTINGS: Vent Mode:  [-]  FiO2 (%):  [50 %-100 %] 100 % INTAKE / OUTPUT:  Intake/Output Summary (Last 24 hours) at 02/25/14 0923 Last data filed at 02/25/14 0300  Gross per 24  hour  Intake    520 ml  Output   2630 ml  Net  -2110 ml    PHYSICAL EXAMINATION: General: pleasant, no distress Neuro:  Alert, normal strength, CN intact HEENT: Pupils reactive, no sinus tenderness, JVD Cardiovascular:  Regular, no murmur Lungs:  Decreased breath sounds, faint basilar rales, no wheeze, decreased respiratory excursion Abdomen:  Soft, mild distention, wounds sites clean, decreased bowel sounds Musculoskeletal:  No edema Skin:  No rashes  LABS: PULMONARY  Recent Labs Lab 02/22/14 0146  PHART 7.348*  PCO2ART 46.3*  PO2ART 51.4*  HCO3 24.8*  TCO2 22.5  O2SAT 84.0    CBC  Recent Labs Lab 02/22/14 0300 02/23/14 0340 02/25/14 0354  HGB 13.2 13.7 12.6*  HCT 40.8 41.9 38.7*  WBC 12.9* 15.3* 12.8*  PLT 213 216 222    COAGULATION  Recent Labs Lab 02/22/14 0300  INR 1.04    CARDIAC  No results found for this basename: TROPONINI,  in the last 168 hours  Recent Labs Lab 02/24/14 1620  PROBNP 234.9*     CHEMISTRY  Recent Labs Lab 02/22/14 0300 02/22/14 0955 02/23/14 0340 02/24/14 0718 02/25/14 0354  NA 134* 135* 133* 131* 137  K 5.2 4.7 4.6 4.5 4.7  CL 99 96 94* 91* 94*  CO2 24 28 29 29  33*  GLUCOSE 152* 144* 131* 141* 185*  BUN 14 14 16 19  27*  CREATININE 1.62* 1.67* 1.68* 1.68* 1.87*  CALCIUM 8.9  9.2 9.2 9.3 9.7   Estimated Creatinine Clearance: 54.1 ml/min (by C-G formula based on Cr of 1.87).   LIVER  Recent Labs Lab 02/22/14 0300 02/22/14 0955 02/23/14 0340 02/25/14 0354  AST 81* 114* 103* 22  ALT 62* 86* 105* 52  ALKPHOS 82 86 79 82  BILITOT 0.4 0.6 0.9 0.4  PROT 7.0 7.3 7.3 7.7  ALBUMIN 3.1* 3.1* 3.0* 2.8*  INR 1.04  --   --   --      INFECTIOUS  Recent Labs Lab 02/22/14 0300  LATICACIDVEN 0.8     ENDOCRINE CBG (last 3)   Recent Labs  02/24/14 2319 02/25/14 0451 02/25/14 0746  GLUCAP 245* 171* 168*      Dg Chest 2 View  02/23/2014   CLINICAL DATA:  Pneumonia, shortness of Breath  EXAM:  CHEST  2 VIEW  COMPARISON:  02/22/2014  FINDINGS: Cardiomegaly again noted. Study is limited by poor inspiration. Persistent bilateral basilar atelectasis or infiltrate. No pulmonary edema. Again noted pneumoperitoneum.  IMPRESSION: Persistent bilateral basilar atelectasis or infiltrate. No pulmonary edema. Limited study by poor inspiration.   Electronically Signed   By: Lahoma Crocker M.D.   On: 02/23/2014 10:43   Dg Chest Port 1 View  02/25/2014   CLINICAL DATA:  Follow-up atelectasis.  EXAM: PORTABLE CHEST - 1 VIEW  COMPARISON:  Chest radiograph performed 02/24/2014  FINDINGS: The lungs remain mildly hypoexpanded. Vascular congestion is again seen, with increased interstitial markings, likely reflecting mild pulmonary edema. Bibasilar airspace opacities likely reflect atelectasis, relatively stable from the prior study, though pneumonia might have a similar appearance. No significant pleural effusion or pneumothorax is seen.  The cardiomediastinal silhouette is mildly enlarged. No acute osseous abnormalities are identified.  IMPRESSION: 1. Lungs mildly hypoexpanded. Bibasilar airspace opacities likely reflect atelectasis, relatively stable from the prior study, though pneumonia might have a similar appearance. 2. Vascular congestion and mild cardiomegaly noted, with increased interstitial markings, likely reflecting mild persistent pulmonary edema.   Electronically Signed   By: Garald Balding M.D.   On: 02/25/2014 06:43   Dg Chest Port 1 View  02/24/2014   CLINICAL DATA:  58 year old male with hypoxemia. History of renal cell carcinoma.  EXAM: PORTABLE CHEST - 1 VIEW  COMPARISON:  02/23/2014 and prior radiographs dating back to 11/09/2004  FINDINGS: Cardiomegaly identified.  Mild interstitial opacities are present in may represent interstitial edema.  Bibasilar atelectasis again is noted in this low volume film.  There is no evidence of pneumothorax.  The cardiomediastinal silhouette is stable.  IMPRESSION:  Cardiomegaly with mild interstitial opacities which may represent mild interstitial edema.  Bibasilar atelectasis and is low volume film.   Electronically Signed   By: Hassan Rowan M.D.   On: 02/24/2014 16:55        ASSESSMENT / PLAN:  PULMONARY A: Acute hypoxic respiratory failure >> likely from combination of hypoventilation from decreased abdominal compliance, atelectasis, and hx of COPD. Tobacco abuse.   - unimproved 02/25/14; unclear why no improvement.  P:   Adjust oxygen to keep SpO2 > 88% Check duple LE and F/u Echo >> if RV pressures high, then consider further evaluation for thromboembolic disease Scheduled BD's Continue prednisone Bronchial hygiene BiPAP prn as tolerated RN and patient advised to follow pulmonary toilet aggressively  CARDIOVASCULAR A:  Hx of CAD, HTN, hyperlipidemia. P:  Continue ASA, lipitor, cardizem, lasix, imdur  RENAL A:   Rt renal mass s/p nephrectomy. P:   Monitor renal fx, urine outpt, electrolytes  Post-op care per urology  GASTROINTESTINAL A:   Abdominal distention after surgery. Hx of GERD. Reported hx of Hep C. P:   F/u LFT's Bowel regimen  HEMATOLOGIC A:   Leukocytosis >> likely from steroids. P:  F/u CBC  INFECTIOUS A:   ?PNA with hx of recurrent PNA. P:   Day 4 Abx, currently on levaquin Check HIV  ENDOCRINE A:   Steroid induced hyperglycemia. P:   SSI  NEUROLOGIC A:   Hx of Depression, Anxiety. Post-op pain control. Hx of cocaine abuse. P:   Continue celexa, prozac, neurontin, lidoderm patch   GLOBAL Check duplex LE and push pulm toilet   The patient is critically ill with multiple organ systems failure and requires high complexity decision making for assessment and support, frequent evaluation and titration of therapies, application of advanced monitoring technologies and extensive interpretation of multiple databases.   Critical Care Time devoted to patient care services described in this note is   30  Minutes.  Dr. Brand Males, M.D., [facility].C.P Pulmonary and Critical Care Medicine Staff Physician [facility] Pager: [phone], If no answer or between  15:00h - 7:00h: call [phone]  02/25/2014 9:31 AM

## 2014-02-25 NOTE — Progress Notes (Signed)
Patient ID: [name], male   DOB: 13-Jan-1956, 58 y.o.   MRN: [id number]  4 Days Post-Op Subjective: Pt remains in ICU due to pulmonary failure.  He was seen by pulmonology yesterday. His hypoxia appears to be related to a combination of pulmonary edema, chronic COPD, decreased abdominal compliance, and possible pneumonia but he has not been demonstrating improvement. Further evaluation by pulmonology is in progress and he continues to receive care with steroid therapy, antibiotics, diuresis, and pulmonary toilet.  He is tolerating diet and has been passing flatus. His pain is controlled.  Objective: Vital signs in last 24 hours: Temp:  [97.4 F (36.3 C)-98.2 F (36.8 C)] 97.4 F (36.3 C) (08/09 0800) Pulse Rate:  [72-92] 72 (08/09 0800) Resp:  [10-23] 23 (08/09 0800) BP: (102-142)/(63-81) 142/70 mmHg (08/09 0800) SpO2:  [77 %-93 %] 93 % (08/09 0800) FiO2 (%):  [50 %-100 %] 100 % (08/09 0846) Weight:  [105.5 kg (232 lb 9.4 oz)] 105.5 kg (232 lb 9.4 oz) (08/09 0400)  Intake/Output from previous day: 08/08 0701 - 08/09 0700 In: 720 [P.O.:720] Out: 2750 [Urine:2750] Intake/Output this shift:    Physical Exam:  General: Alert and oriented Lungs: Decreased breath sounds bilaterally. Abdomen: Soft, ND, positive BS Incisions: C/D/I  Lab Results:  Recent Labs  02/23/14 0340 02/25/14 0354  HGB 13.7 12.6*  HCT 41.9 38.7*   BMET  Recent Labs  02/24/14 0718 02/25/14 0354  NA 131* 137  K 4.5 4.7  CL 91* 94*  CO2 29 33*  GLUCOSE 141* 185*  BUN 19 27*  CREATININE 1.68* 1.87*  CALCIUM 9.3 9.7     Studies/Results: Dg Chest 2 View  02/23/2014   CLINICAL DATA:  Pneumonia, shortness of Breath  EXAM: CHEST  2 VIEW  COMPARISON:  02/22/2014  FINDINGS: Cardiomegaly again noted. Study is limited by poor inspiration. Persistent bilateral basilar atelectasis or infiltrate. No pulmonary edema. Again noted pneumoperitoneum.  IMPRESSION: Persistent bilateral basilar atelectasis or  infiltrate. No pulmonary edema. Limited study by poor inspiration.   Electronically Signed   By: Lahoma Crocker M.D.   On: 02/23/2014 10:43   Dg Chest Port 1 View  02/25/2014   CLINICAL DATA:  Follow-up atelectasis.  EXAM: PORTABLE CHEST - 1 VIEW  COMPARISON:  Chest radiograph performed 02/24/2014  FINDINGS: The lungs remain mildly hypoexpanded. Vascular congestion is again seen, with increased interstitial markings, likely reflecting mild pulmonary edema. Bibasilar airspace opacities likely reflect atelectasis, relatively stable from the prior study, though pneumonia might have a similar appearance. No significant pleural effusion or pneumothorax is seen.  The cardiomediastinal silhouette is mildly enlarged. No acute osseous abnormalities are identified.  IMPRESSION: 1. Lungs mildly hypoexpanded. Bibasilar airspace opacities likely reflect atelectasis, relatively stable from the prior study, though pneumonia might have a similar appearance. 2. Vascular congestion and mild cardiomegaly noted, with increased interstitial markings, likely reflecting mild persistent pulmonary edema.   Electronically Signed   By: Garald Balding M.D.   On: 02/25/2014 06:43   Dg Chest Port 1 View  02/24/2014   CLINICAL DATA:  58 year old male with hypoxemia. History of renal cell carcinoma.  EXAM: PORTABLE CHEST - 1 VIEW  COMPARISON:  02/23/2014 and prior radiographs dating back to 11/09/2004  FINDINGS: Cardiomegaly identified.  Mild interstitial opacities are present in may represent interstitial edema.  Bibasilar atelectasis again is noted in this low volume film.  There is no evidence of pneumothorax.  The cardiomediastinal silhouette is stable.  IMPRESSION: Cardiomegaly with mild interstitial opacities  which may represent mild interstitial edema.  Bibasilar atelectasis and is low volume film.   Electronically Signed   By: Hassan Rowan M.D.   On: 02/24/2014 16:55    Assessment/Plan: POD # 4 s/p nephrectomy - Pt continues to have  pulmonary issues as described above.  He has an echocardiogram and lower extremity duplex ultrasound pending for further evaluation. Pulmonology now following. - He is otherwise progressing as expected. Disposition will be pending his recovery from his pulmonary issues.   LOS: 4 days   [place] Mosco,LES 02/25/2014, 10:07 AM

## 2014-02-25 NOTE — Progress Notes (Signed)
Note: This document was prepared with digital dictation and possible smart phrase technology. Any transcriptional errors that result from this process are unintentional.   JAEGER TRUEHEART KWI:[id number] DOB: [birth year] DOA: 02/21/2014 PCP: [name], BETTY G, MD  Brief narrative: 58 y/o ?, known h/o CAD s/p PCI->LAD8/2014-most recent cath 12/20/13=non-obst disease, DM ty 2 since 2011, Htn, GAD, Cocaine use, recent cholectomy 12/18/13 admit, Prior TIA '90s, still smoker with COPD admitted electively for R Nephrectomy,  Developed in PACU Acute hypoxic respiratory failure-noted + 4 liters fluids during procedure  Given IV lasix 40 mg x 1 and placed on CPAP  NGt placed as significant pneumoperitoneum-eventually tthis was removed and he tolerated a diet He remained hypoxic in SDU despite good resolution of mentation and sats still in only mid 80's without oxygen He was cautiously diuresed 2/2 to nephrectomy, echo ordered and Pulm/CCM consulted due to persistent hypoxia  Past medical history-As per Problem list Chart reviewed as below-  Consultants:  Pulmonary 8/8  Procedures:  None  Antibiotics:  Zosyn 8/6-8/8  Vancomycin 8/6--8/8  Levaquin 8/8   Subjective    Doing fair. Desats to the 80s with decrease in oxygen. Did not wear BiPAP last night? Tolerating diet, no stool as yet No fever no chills no nausea     Objective    Interim History: none  Telemetry: sinus   Objective: Filed Vitals:   02/25/14 0000 02/25/14 0200 02/25/14 0400 02/25/14 0404  BP: 137/78 134/80  137/63  Pulse: 86 75  81  Temp: 98 F (36.7 C)  97.6 F (36.4 C)   TempSrc: Oral  Oral   Resp: 12 11  18   Height:      Weight:   105.5 kg (232 lb 9.4 oz)   SpO2: 90% 93%  93%    Intake/Output Summary (Last 24 hours) at 02/25/14 0747 Last data filed at 02/25/14 0300  Gross per 24 hour  Intake    720 ml  Output   2750 ml  Net  -2030 ml    Exam:  General: EOMI, NCAT Cardiovascular: S1-S2 no  murmur rub or gallop Respiratory: Clinically clear no added sound Abdomen: Soft slightly distended nontender no rebound no guarding-. Wounds look clean Skin no lower extremity edema Neuro grossly intact moving all 4 limbs equally-alert  Data Reviewed: Basic Metabolic Panel:  Recent Labs Lab 02/22/14 0300 02/22/14 0955 02/23/14 0340 02/24/14 0718 02/25/14 0354  NA 134* 135* 133* 131* 137  K 5.2 4.7 4.6 4.5 4.7  CL 99 96 94* 91* 94*  CO2 24 28 29 29  33*  GLUCOSE 152* 144* 131* 141* 185*  BUN 14 14 16 19  27*  CREATININE 1.62* 1.67* 1.68* 1.68* 1.87*  CALCIUM 8.9 9.2 9.2 9.3 9.7   Liver Function Tests:  Recent Labs Lab 02/22/14 0300 02/22/14 0955 02/23/14 0340 02/25/14 0354  AST 81* 114* 103* 22  ALT 62* 86* 105* 52  ALKPHOS 82 86 79 82  BILITOT 0.4 0.6 0.9 0.4  PROT 7.0 7.3 7.3 7.7  ALBUMIN 3.1* 3.1* 3.0* 2.8*   No results found for this basename: LIPASE, AMYLASE,  in the last 168 hours No results found for this basename: AMMONIA,  in the last 168 hours CBC:  Recent Labs Lab 02/21/14 1238 02/22/14 0047 02/22/14 0300 02/23/14 0340 02/25/14 0354  WBC  --   --  12.9* 15.3* 12.8*  NEUTROABS  --   --  11.0* 12.9* 11.1*  HGB 13.8 13.1 13.2 13.7 12.6*  HCT 42.5 40.2  40.8 41.9 38.7*  MCV  --   --  86.8 86.6 85.6  PLT  --   --  213 216 222   Cardiac Enzymes: No results found for this basename: CKTOTAL, CKMB, CKMBINDEX, TROPONINI,  in the last 168 hours BNP: No components found with this basename: POCBNP,  CBG:  Recent Labs Lab 02/24/14 1200 02/24/14 1725 02/24/14 2044 02/24/14 2319 02/25/14 0451  GLUCAP 177* 200* 248* 245* 171*    Recent Results (from the past 240 hour(s))  MRSA PCR SCREENING     Status: None   Collection Time    02/22/14  3:00 AM      Result Value Ref Range Status   MRSA by PCR NEGATIVE  NEGATIVE Final   Comment:            The GeneXpert MRSA Assay (FDA     approved for NASAL specimens     only), is one component of a      comprehensive MRSA colonization     surveillance program. It is not     intended to diagnose MRSA     infection nor to guide or     monitor treatment for     MRSA infections.     Studies:              All Imaging reviewed and is as per above notation   Scheduled Meds: . albuterol  2.5 mg Nebulization Q6H WA  . antiseptic oral rinse  7 mL Mouth Rinse BID  . aspirin  81 mg Oral Daily  . atorvastatin  40 mg Oral q1800  . chlorhexidine  15 mL Mouth Rinse BID  . citalopram  40 mg Oral q morning - 10a  . diltiazem  240 mg Oral q morning - 10a  . docusate sodium  100 mg Oral BID  . FLUoxetine  10 mg Oral q morning - 10a  . furosemide  40 mg Oral Daily  . gabapentin  300 mg Oral QHS  . insulin aspart  0-9 Units Subcutaneous TID WC  . ipratropium  0.5 mg Nebulization Q6H WA  . isosorbide mononitrate  60 mg Oral q morning - 10a  . levofloxacin  500 mg Oral Daily  . lidocaine  1 patch Transdermal Daily  . predniSONE  60 mg Oral QAC breakfast  . senna  1 tablet Oral BID   Continuous Infusions:    Assessment/Plan: 1. Toxic metabolic encephalopathy-probably secondary to effect of anesthetics, pain medications-clearing rapidly.  Minimize narcotics 2. Acute hypoxic respiratory failure-multifactorial -diuresed with IV Lasix x1 with very good result. . Repeat chest x-ray 8/7a.m.= Atelectasis/PNA - 3.68 L so far for this admission. Echocardiogram 2013 = EF 65-70% c LVH-repeat echo still pending added lasix 40 mg HA:[id number] 60 mg daily  02/24/14  3. Possible pneumonia-Legionella /S. pneumonia were negative-empiric vancomycin/Zosyn-Narrowed to PO levaquin 8/8.  Leukocytosis may be more post-op state and has had no fevers.   4. History CAD-continue isosorbide 60 mg orally daily, aspirin 81 daily.  Keep on telemetry for now. 5. S/p Radical nephrectomy 02/21/14 T2N0Mx renal cell carcinoma two foci -pain control-have discontinued fentanyl IV every 4 when necessary continue oxycodone  5 mg to 10 mg every  4 when necessary  6. Depression-continue citalopram 40 daily, fluoxetine 10 daily 7. Type 2 diabetes mellitus with neuropathy-continue 4 times a day. Blood sugars reasonably controlled on 71-240 8. Chronic kidney disease stage 3-4-careful with diuresis. Has one functioning kidney now.  May elect to back off  of Lasix slowly when echo comes back 9. Contraction alkalosis-CO2 33. Monitor carefully under diuresis 10. Pneumoperitoneum seems to be resolving 11.  prior history of cocaine use-stable 12. Elevated LFT's-unclear etiology-consider OP work-up 13. COPD-current daily smoker 1/2 ppd.  Added 8/8 Flovent 22 mcg bid, Tiotropium 18 bid and split duoneb component to Albuterol nebs-no wheeze but possible component  Code Status:  full  Family Communication:  no family at bedside  Disposition Plan: Remains on step down because of hypoxic respiratory failure   [name], MD  [facility] Pager [phone] 02/25/2014, 7:47 AM    LOS: 4 days

## 2014-02-25 NOTE — Progress Notes (Signed)
Echo Lab  2D Echocardiogram completed.  [place], RDCS 02/25/2014 10:33 AM

## 2014-02-26 ENCOUNTER — Inpatient Hospital Stay (HOSPITAL_COMMUNITY): Payer: Medicaid Other

## 2014-02-26 DIAGNOSIS — J4489 Other specified chronic obstructive pulmonary disease: Secondary | ICD-10-CM

## 2014-02-26 DIAGNOSIS — J449 Chronic obstructive pulmonary disease, unspecified: Secondary | ICD-10-CM

## 2014-02-26 LAB — GLUCOSE, CAPILLARY
Glucose-Capillary: 296 mg/dL — ABNORMAL HIGH (ref 70–99)
Glucose-Capillary: 141 mg/dL — ABNORMAL HIGH (ref 70–99)
Glucose-Capillary: 175 mg/dL — ABNORMAL HIGH (ref 70–99)
Glucose-Capillary: 248 mg/dL — ABNORMAL HIGH (ref 70–99)

## 2014-02-26 LAB — TROPONIN I

## 2014-02-26 LAB — RENAL FUNCTION PANEL
ALBUMIN: 2.8 g/dL — AB (ref 3.5–5.2)
ANION GAP: 12 (ref 5–15)
BUN: 33 mg/dL — AB (ref 6–23)
CHLORIDE: 98 meq/L (ref 96–112)
CO2: 30 mEq/L (ref 19–32)
Calcium: 9.6 mg/dL (ref 8.4–10.5)
Creatinine, Ser: 1.64 mg/dL — ABNORMAL HIGH (ref 0.50–1.35)
GFR calc Af Amer: 52 mL/min — ABNORMAL LOW (ref >90)
GFR calc non Af Amer: 45 mL/min — ABNORMAL LOW (ref >90)
GLUCOSE: 210 mg/dL — AB (ref 70–99)
POTASSIUM: 4.9 meq/L (ref 3.7–5.3)
Phosphorus: 3 mg/dL (ref 2.3–4.6)
Sodium: 140 mEq/L (ref 137–147)

## 2014-02-26 LAB — PHOSPHORUS: Phosphorus: 3.1 mg/dL (ref 2.3–4.6)

## 2014-02-26 LAB — MAGNESIUM: Magnesium: 2.4 mg/dL (ref 1.5–2.5)

## 2014-02-26 MED ORDER — INSULIN GLARGINE 100 UNIT/ML ~~LOC~~ SOLN
15.0000 [IU] | Freq: Every day | SUBCUTANEOUS | Status: DC
  Administered 2014-02-26 – 2014-02-27 (×2): 15 [IU] via SUBCUTANEOUS
  Filled 2014-02-26 (×2): qty 0.15

## 2014-02-26 MED ORDER — INSULIN GLARGINE 100 UNIT/ML ~~LOC~~ SOLN
10.0000 [IU] | Freq: Every day | SUBCUTANEOUS | Status: DC
  Filled 2014-02-26: qty 0.1

## 2014-02-26 MED ORDER — PREDNISONE 20 MG PO TABS
40.0000 mg | ORAL_TABLET | Freq: Every day | ORAL | Status: DC
  Administered 2014-02-27: 40 mg via ORAL
  Filled 2014-02-26 (×2): qty 2

## 2014-02-26 MED ORDER — INSULIN ASPART 100 UNIT/ML ~~LOC~~ SOLN
0.0000 [IU] | Freq: Three times a day (TID) | SUBCUTANEOUS | Status: DC
  Administered 2014-02-26: 4 [IU] via SUBCUTANEOUS
  Administered 2014-02-26: 7 [IU] via SUBCUTANEOUS
  Administered 2014-02-27: 4 [IU] via SUBCUTANEOUS

## 2014-02-26 NOTE — Progress Notes (Signed)
Patient ID: [name], male   DOB: 1955-09-23, 58 y.o.   MRN: [id number]  5 Days Post-Op Subjective: Pt remains in ICU due to hypoxic pulmonary failure.  Seen by pulmonology over the weekend. Slowly improving. From a surgical standpoint, he is tolerating diet and has been passing flatus. His foley catheter has been removed and he's voiding well. He ambulated 30 feet this morning. His pain is controlled.  Objective: Vital signs in last 24 hours: Temp:  [97.4 F (36.3 C)-98 F (36.7 C)] 98 F (36.7 C) (08/10 0400) Pulse Rate:  [64-91] 70 (08/10 0600) Resp:  [9-26] 14 (08/10 0600) BP: (101-152)/(41-83) 122/83 mmHg (08/10 0600) SpO2:  [90 %-97 %] 92 % (08/10 0600) FiO2 (%):  [100 %] 100 % (08/09 1600) Weight:  [104.5 kg (230 lb 6.1 oz)] 104.5 kg (230 lb 6.1 oz) (08/10 0400)  Intake/Output from previous day: 08/09 0701 - 08/10 0700 In: 560 [P.O.:560] Out: 3225 [Urine:3225] Intake/Output this shift:    Physical Exam:  General: Alert and oriented Lungs: Decreased breath sounds bilaterally. Abdomen: Soft, ND, positive BS Incisions: C/D/I  Lab Results:  Recent Labs  02/25/14 0354  HGB 12.6*  HCT 38.7*   BMET  Recent Labs  02/25/14 0354 02/26/14 0515  NA 137 140  K 4.7 4.9  CL 94* 98  CO2 33* 30  GLUCOSE 185* 210*  BUN 27* 33*  CREATININE 1.87* 1.64*  CALCIUM 9.7 9.6     Studies/Results: Dg Chest Port 1 View  02/26/2014   CLINICAL DATA:  Respiratory failure.  EXAM: PORTABLE CHEST - 1 VIEW  COMPARISON:  02/25/2014  FINDINGS: Numerous leads and wires project over the chest. Midline trachea. Normal heart size for level of inspiration. No pleural effusion or pneumothorax. Improvement in pulmonary interstitial thickening. Patchy left base opacities are similar, given differences in technique. Improved right basilar aeration.  IMPRESSION: Similar patchy left base opacities, favored to represent atelectasis. Minimal infection could look similar.  Otherwise, improved  aeration with resolution of pulmonary venous congestion and right base airspace disease.   Electronically Signed   By: Abigail Miyamoto M.D.   On: 02/26/2014 07:10   Dg Chest Port 1 View  02/25/2014   CLINICAL DATA:  Follow-up atelectasis.  EXAM: PORTABLE CHEST - 1 VIEW  COMPARISON:  Chest radiograph performed 02/24/2014  FINDINGS: The lungs remain mildly hypoexpanded. Vascular congestion is again seen, with increased interstitial markings, likely reflecting mild pulmonary edema. Bibasilar airspace opacities likely reflect atelectasis, relatively stable from the prior study, though pneumonia might have a similar appearance. No significant pleural effusion or pneumothorax is seen.  The cardiomediastinal silhouette is mildly enlarged. No acute osseous abnormalities are identified.  IMPRESSION: 1. Lungs mildly hypoexpanded. Bibasilar airspace opacities likely reflect atelectasis, relatively stable from the prior study, though pneumonia might have a similar appearance. 2. Vascular congestion and mild cardiomegaly noted, with increased interstitial markings, likely reflecting mild persistent pulmonary edema.   Electronically Signed   By: Garald Balding M.D.   On: 02/25/2014 06:43   Dg Chest Port 1 View  02/24/2014   CLINICAL DATA:  58 year old male with hypoxemia. History of renal cell carcinoma.  EXAM: PORTABLE CHEST - 1 VIEW  COMPARISON:  02/23/2014 and prior radiographs dating back to 11/09/2004  FINDINGS: Cardiomegaly identified.  Mild interstitial opacities are present in may represent interstitial edema.  Bibasilar atelectasis again is noted in this low volume film.  There is no evidence of pneumothorax.  The cardiomediastinal silhouette is stable.  IMPRESSION: Cardiomegaly  with mild interstitial opacities which may represent mild interstitial edema.  Bibasilar atelectasis and is low volume film.   Electronically Signed   By: Hassan Rowan M.D.   On: 02/24/2014 16:55    Assessment/Plan: POD # 5 s/p nephrectomy - Pt  continues to have pulmonary issues as described above.  Echo and LE dopplers negative. Pulmonology now following. - He is otherwise progressing as expected. Disposition will be pending his recovery from his pulmonary issues. Currently on 6L.   LOS: 5 days   Margo Aye 02/26/2014, 7:20 AM  A have seen and examined the patient.   S: 1 - Large Right Renal Mass - s/p robotic right radical nephrectomy and extensive adhesiolysis 02/21/14 for pT2N0Mx clear cell renal cell carcinoma with negative margins.  Pre-op staging wihtout adenopathy or chest lesions.   2 - Renal Insufficiency - Baseline Cr 1.0. Now s/p nephrectomy. Cr, K acceptable on daily labs thus far, Cr 1.6's.  3 - Post-operative Hypoxia - Transferred to ICU 8/6 for progressive somnolence, hypoxia. Critical care consultation sought and began empiric therapy for possible aspiration and optimizing pulm parmaters (NG for gastric decompression, diuresis), Began non-rereather. LE duplex and TTE negative for DVT or acute cardiac decompensation. Making progress now on 3L [place].  Today [name] is withotu complaints. + BM and tollerating diet. Ambulatory w/o sig SOB.  O: AFVSS ON 3l [place] O2 Abd soft, surgical sites c/d/i.  A/P:   1 - Large Right Renal Mass - given copy of path report and discussed.   2 - Renal Insufficiency - GFR and lytes remain acceptable post nephrectomy.   3 - Post-operative Hypoxia - Appreciate int med and pulm help, seek recs on discharge planning (home with O2?, HH?).

## 2014-02-26 NOTE — Progress Notes (Signed)
Physical Therapy Treatment Patient Details Name: [name] MRN: [id number] DOB: 1955/10/19 Today's Date: 02/26/2014    History of Present Illness 58 yo male s/p radical nephrectomy, extensive lysis of adhesiolysis 02/21/14.     PT Comments    Progressing well; DOE improved  Follow Up Recommendations  Home health PT;Supervision/Assistance - 24 hour     Equipment Recommendations  None recommended by PT    Recommendations for Other Services OT consult     Precautions / Restrictions Precautions Precautions: Fall Precaution Comments: abdominal surgery Restrictions Weight Bearing Restrictions: No    Mobility  Bed Mobility Overal bed mobility: Needs Assistance Bed Mobility: Supine to Sit;Sit to Supine     Supine to sit: Min guard Sit to supine: Supervision   General bed mobility comments: verbal cues to self assist  Transfers Overall transfer level: Needs assistance Equipment used: 1 person hand held assist Transfers: Sit to/from Stand Sit to Stand: Min assist;Min guard         General transfer comment: cues for overall safety and hand placement  Ambulation/Gait Ambulation/Gait assistance: Min assist Ambulation Distance (Feet): 220 Feet (10') Assistive device: Rolling walker (2 wheeled);2 person hand held assist Gait Pattern/deviations: Step-through pattern;Decreased stride length;Drifts right/left   Gait velocity interpretation: Below normal speed for age/gender General Gait Details: cues for RW safety, one standing rest due to decr sats/DOE   Stairs            Wheelchair Mobility    Modified Rankin (Stroke Patients Only)       Balance           Standing balance support: Bilateral upper extremity supported;No upper extremity supported;During functional activity Standing balance-Leahy Scale: Fair                      Cognition Arousal/Alertness: Awake/alert Behavior During Therapy: WFL for tasks assessed/performed Overall  Cognitive Status: Within Functional Limits for tasks assessed                      Exercises Total Joint Exercises Long Arc Quad: 15 reps;Both;Strengthening    General Comments        Pertinent Vitals/Pain Pain Assessment: 0-10 Pain Score: 3  Pain Location: abdomen Pain Intervention(s): Limited activity within patient's tolerance;Monitored during session  O2 sats 93% at rest on 2L                88-92% on 2L with exertion                                                                                            93-95% on 2L after amb/56min rest  Instructed in pursed lip breathing   Home Living                      Prior Function            PT Goals (current goals can now be found in the care plan section) Acute Rehab PT Goals Patient Stated Goal: home. less pain. regain independence Time For Goal Achievement: 03/09/14 Potential to Achieve Goals: Good Progress towards PT goals: Progressing toward goals    Frequency  Min 3X/week    PT Plan Current plan remains appropriate    Co-evaluation             End of Session Equipment Utilized During Treatment: Gait belt Activity Tolerance: Patient tolerated treatment well Patient left: in bed;with call bell/phone within reach     Time: 1414-1443 PT Time Calculation (min): 29 min  Charges:  $Gait Training: 23-37 mins                    G Codes:      [name] Mar 19, 2014, 2:58 PM

## 2014-02-26 NOTE — Progress Notes (Signed)
PULMONARY / CRITICAL CARE MEDICINE   Name: [name] MRN: [id number] DOB: [birth year]    ADMISSION DATE:  02/21/2014 CONSULTATION DATE:  02/21/2014  REFERRING MD :  Lynne Logan  CHIEF COMPLAINT:  Hypoxia  INITIAL PRESENTATION:  58 yo male smoker with hx of COPD had rt nephrectomy on 8/05 (eventual dx T2NoM0 renal cell Ca).  He had problems with lethargy and hypoxia after surgery.  Mental status has improved, but he still requires supplemental oxygen.  He has been given lasix and had good diuresis >> not much improvement in oxygenation.  He had abdominal distention after surgery and had NG tube to decompression abdomen >> again not much improvement in oxygenation.  He was tried on BiPAP, but had difficulty tolerating this.   SIGNIFICANT EVENTS: 8/05 Rt nephrectomy 8/06 Lethargic, hypoxia, transfer to SDU, [facility] 02/25/14 echo -nml LV fn 8/9 duplex neg BLE  SUBJECTIVE:   afebrile] Breathing better 92% on 3L [place]  VITAL SIGNS: Temp:  [97.4 F (36.3 C)-98 F (36.7 C)] 97.5 F (36.4 C) (08/10 0800) Pulse Rate:  [64-91] 67 (08/10 0800) Resp:  [9-26] 12 (08/10 0800) BP: (101-152)/(41-83) 138/61 mmHg (08/10 0800) SpO2:  [90 %-97 %] 91 % (08/10 0800) FiO2 (%):  [100 %] 100 % (08/09 1600) Weight:  [104.5 kg (230 lb 6.1 oz)] 104.5 kg (230 lb 6.1 oz) (08/10 0400) HEMODYNAMICS:   VENTILATOR SETTINGS: Vent Mode:  [-]  FiO2 (%):  [100 %] 100 % INTAKE / OUTPUT:  Intake/Output Summary (Last 24 hours) at 02/26/14 0852 Last data filed at 02/26/14 0800  Gross per 24 hour  Intake    800 ml  Output   3225 ml  Net  -2425 ml    PHYSICAL EXAMINATION: General: pleasant, no distress Neuro:  Alert, normal strength, CN intact HEENT: Pupils reactive, no sinus tenderness, JVD Cardiovascular:  Regular, no murmur Lungs:  Decreased breath sounds, faint basilar rales esp rt that do not disappear on coughing, no wheeze, decreased respiratory excursion Abdomen:  Soft, mild  distention, wounds sites clean, decreased bowel sounds Musculoskeletal:  No edema Skin:  No rashes  LABS: PULMONARY  Recent Labs Lab 02/22/14 0146  PHART 7.348*  PCO2ART 46.3*  PO2ART 51.4*  HCO3 24.8*  TCO2 22.5  O2SAT 84.0    CBC  Recent Labs Lab 02/22/14 0300 02/23/14 0340 02/25/14 0354  HGB 13.2 13.7 12.6*  HCT 40.8 41.9 38.7*  WBC 12.9* 15.3* 12.8*  PLT 213 216 222    COAGULATION  Recent Labs Lab 02/22/14 0300  INR 1.04    CARDIAC    Recent Labs Lab 02/26/14 0515  TROPONINI <0.30    Recent Labs Lab 02/24/14 1620  PROBNP 234.9*     CHEMISTRY  Recent Labs Lab 02/22/14 0955 02/23/14 0340 02/24/14 0718 02/25/14 0354 02/26/14 0515  NA 135* 133* 131* 137 140  K 4.7 4.6 4.5 4.7 4.9  CL 96 94* 91* 94* 98  CO2 28 29 29  33* 30  GLUCOSE 144* 131* 141* 185* 210*  BUN 14 16 19  27* 33*  CREATININE 1.67* 1.68* 1.68* 1.87* 1.64*  CALCIUM 9.2 9.2 9.3 9.7 9.6  MG  --   --   --   --  2.4  PHOS  --   --   --   --  3.0  3.1   Estimated Creatinine Clearance: 61.4 ml/min (by C-G formula based on Cr of 1.64).   LIVER  Recent Labs Lab 02/22/14 0300 02/22/14 0955 02/23/14 0340 02/25/14 0354  02/26/14 0515  AST 81* 114* 103* 22  --   ALT 62* 86* 105* 52  --   ALKPHOS 82 86 79 82  --   BILITOT 0.4 0.6 0.9 0.4  --   PROT 7.0 7.3 7.3 7.7  --   ALBUMIN 3.1* 3.1* 3.0* 2.8* 2.8*  INR 1.04  --   --   --   --      INFECTIOUS  Recent Labs Lab 02/22/14 0300  LATICACIDVEN 0.8     ENDOCRINE CBG (last 3)   Recent Labs  02/25/14 1722 02/26/14 0051 02/26/14 0735  GLUCAP 266* 296* 141*      Dg Chest Port 1 View  02/26/2014   CLINICAL DATA:  Respiratory failure.  EXAM: PORTABLE CHEST - 1 VIEW  COMPARISON:  02/25/2014  FINDINGS: Numerous leads and wires project over the chest. Midline trachea. Normal heart size for level of inspiration. No pleural effusion or pneumothorax. Improvement in pulmonary interstitial thickening. Patchy left  base opacities are similar, given differences in technique. Improved right basilar aeration.  IMPRESSION: Similar patchy left base opacities, favored to represent atelectasis. Minimal infection could look similar.  Otherwise, improved aeration with resolution of pulmonary venous congestion and right base airspace disease.   Electronically Signed   By: Abigail Miyamoto M.D.   On: 02/26/2014 07:10   Dg Chest Port 1 View  02/25/2014   CLINICAL DATA:  Follow-up atelectasis.  EXAM: PORTABLE CHEST - 1 VIEW  COMPARISON:  Chest radiograph performed 02/24/2014  FINDINGS: The lungs remain mildly hypoexpanded. Vascular congestion is again seen, with increased interstitial markings, likely reflecting mild pulmonary edema. Bibasilar airspace opacities likely reflect atelectasis, relatively stable from the prior study, though pneumonia might have a similar appearance. No significant pleural effusion or pneumothorax is seen.  The cardiomediastinal silhouette is mildly enlarged. No acute osseous abnormalities are identified.  IMPRESSION: 1. Lungs mildly hypoexpanded. Bibasilar airspace opacities likely reflect atelectasis, relatively stable from the prior study, though pneumonia might have a similar appearance. 2. Vascular congestion and mild cardiomegaly noted, with increased interstitial markings, likely reflecting mild persistent pulmonary edema.   Electronically Signed   By: Garald Balding M.D.   On: 02/25/2014 06:43   Dg Chest Port 1 View  02/24/2014   CLINICAL DATA:  58 year old male with hypoxemia. History of renal cell carcinoma.  EXAM: PORTABLE CHEST - 1 VIEW  COMPARISON:  02/23/2014 and prior radiographs dating back to 11/09/2004  FINDINGS: Cardiomegaly identified.  Mild interstitial opacities are present in may represent interstitial edema.  Bibasilar atelectasis again is noted in this low volume film.  There is no evidence of pneumothorax.  The cardiomediastinal silhouette is stable.  IMPRESSION: Cardiomegaly with mild  interstitial opacities which may represent mild interstitial edema.  Bibasilar atelectasis and is low volume film.   Electronically Signed   By: Hassan Rowan M.D.   On: 02/24/2014 16:55        ASSESSMENT / PLAN:  PULMONARY A: Acute hypoxic respiratory failure >> likely from combination of hypoventilation from decreased abdominal compliance, atelectasis, and hx of COPD. Tobacco abuse.  P:   Scheduled BD's Continue prednisone -taper over 2 weeks Incentive spirometry, oob, mobilise Dc BiPAP  Will likely need home O2 for uncertain period   CARDIOVASCULAR A:  Hx of CAD, HTN, hyperlipidemia. P:  Continue ASA, lipitor, cardizem, lasix, imdur  RENAL A:   Rt renal mass s/p nephrectomy. P:   Monitor renal fx, urine outpt, electrolytes Post-op care per urology  GASTROINTESTINAL A:  Abdominal distention after surgery. Hx of GERD.  Hep C Ab neg. P:   F/u LFT's Bowel regimen  HEMATOLOGIC A:   Leukocytosis >> likely from steroids. P:  F/u CBC  INFECTIOUS A:   ?PNA with hx of recurrent PNA. P:   Day 5 Abx, currently on levaquin   ENDOCRINE A:   Steroid induced hyperglycemia. P:   SSI -resistant scale  NEUROLOGIC A:   Hx of Depression, Anxiety. Post-op pain control. Hx of cocaine abuse. P:   Continue celexa, prozac, neurontin, lidoderm patch  Summary - COPD + atelectais as caus eof hypoxia, likely needs home O2 OK to transfer to floor, satn 88% & above OK Will need FU with dr Chase Caller on discharge - smoking cessation paramount  Kara Mead MD. [facility]. [facility] Pager [phone] If no response call [phone]    02/26/2014 8:52 AM

## 2014-02-26 NOTE — Progress Notes (Signed)
Note: This document was prepared with digital dictation and possible smart phrase technology. Any transcriptional errors that result from this process are unintentional.   AMMAR MOFFATT VPX:[id number] DOB: Jul 19, 1956 DOA: 02/21/2014 PCP: [name], BETTY G, MD  Brief narrative: 58 y/o ?, known h/o CAD s/p PCI->LAD8/2014-most recent cath 12/20/13=non-obst disease, DM ty 2 since 2011, Htn, GAD, Cocaine use, recent cholectomy 12/18/13 admit, Prior TIA '90s, still smoker with COPD admitted electively for R Nephrectomy,  Developed in PACU Acute hypoxic respiratory failure-noted + 4 liters fluids during procedure  Given IV lasix 40 mg x 1 and placed on CPAP  NGt placed as significant pneumoperitoneum-eventually tthis was removed and he tolerated a diet He remained hypoxic in SDU despite good resolution of mentation and sats still in only mid 80's without oxygen He was cautiously diuresed 2/2 to nephrectomy, echo ordered and Pulm/CCM consulted due to persistent hypoxia  Past medical history-As per Problem list Chart reviewed as below-  Consultants:  Pulmonary 8/8  Procedures:  None  Antibiotics:  Zosyn 8/6-8/8  Vancomycin 8/6--8/8  Levaquin 8/8   Subjective    Doing fair. weaned to 3 l [place] but needed 6 l last night walked 30 feet last pm Overall feels WOB is decreased No cp Mild cough No sputum    Objective    Interim History: none  Telemetry: sinus   Objective: Filed Vitals:   02/26/14 0410 02/26/14 0600 02/26/14 0746 02/26/14 0800  BP: 142/73 122/83  138/61  Pulse: 79 70  67  Temp:    97.5 F (36.4 C)  TempSrc:    Oral  Resp: 13 14  12   Height:    6' (1.829 m)  Weight:    104.5 kg (230 lb 6.1 oz)  SpO2: 93% 92% 90% 91%    Intake/Output Summary (Last 24 hours) at 02/26/14 0827 Last data filed at 02/26/14 0800  Gross per 24 hour  Intake    800 ml  Output   3225 ml  Net  -2425 ml    Exam:  General: EOMI, NCAT Cardiovascular: S1-S2 no murmur rub or  gallop Respiratory: Clinically clear no added sound Abdomen: Soft slightly distended nontender no rebound no guarding Skin no lower extremity edema Neuro grossly intact moving all 4 limbs equally-alert  Data Reviewed: Basic Metabolic Panel:  Recent Labs Lab 02/22/14 0955 02/23/14 0340 02/24/14 0718 02/25/14 0354 02/26/14 0515  NA 135* 133* 131* 137 140  K 4.7 4.6 4.5 4.7 4.9  CL 96 94* 91* 94* 98  CO2 28 29 29  33* 30  GLUCOSE 144* 131* 141* 185* 210*  BUN 14 16 19  27* 33*  CREATININE 1.67* 1.68* 1.68* 1.87* 1.64*  CALCIUM 9.2 9.2 9.3 9.7 9.6  MG  --   --   --   --  2.4  PHOS  --   --   --   --  3.0  3.1   Liver Function Tests:  Recent Labs Lab 02/22/14 0300 02/22/14 0955 02/23/14 0340 02/25/14 0354 02/26/14 0515  AST 81* 114* 103* 22  --   ALT 62* 86* 105* 52  --   ALKPHOS 82 86 79 82  --   BILITOT 0.4 0.6 0.9 0.4  --   PROT 7.0 7.3 7.3 7.7  --   ALBUMIN 3.1* 3.1* 3.0* 2.8* 2.8*   No results found for this basename: LIPASE, AMYLASE,  in the last 168 hours No results found for this basename: AMMONIA,  in the last 168 hours CBC:  Recent Labs Lab 02/21/14 1238 02/22/14 0047 02/22/14 0300 02/23/14 0340 02/25/14 0354  WBC  --   --  12.9* 15.3* 12.8*  NEUTROABS  --   --  11.0* 12.9* 11.1*  HGB 13.8 13.1 13.2 13.7 12.6*  HCT 42.5 40.2 40.8 41.9 38.7*  MCV  --   --  86.8 86.6 85.6  PLT  --   --  213 216 222   Cardiac Enzymes:  Recent Labs Lab 02/26/14 0515  TROPONINI <0.30   BNP: No components found with this basename: POCBNP,  CBG:  Recent Labs Lab 02/25/14 0746 02/25/14 1117 02/25/14 1722 02/26/14 0051 02/26/14 0735  GLUCAP 168* 180* 266* 296* 141*    Recent Results (from the past 240 hour(s))  MRSA PCR SCREENING     Status: None   Collection Time    02/22/14  3:00 AM      Result Value Ref Range Status   MRSA by PCR NEGATIVE  NEGATIVE Final   Comment:            The GeneXpert MRSA Assay (FDA     approved for NASAL specimens      only), is one component of a     comprehensive MRSA colonization     surveillance program. It is not     intended to diagnose MRSA     infection nor to guide or     monitor treatment for     MRSA infections.     Studies:              All Imaging reviewed and is as per above notation   Scheduled Meds: . antiseptic oral rinse  7 mL Mouth Rinse BID  . aspirin  81 mg Oral Daily  . atorvastatin  40 mg Oral q1800  . chlorhexidine  15 mL Mouth Rinse BID  . citalopram  40 mg Oral q morning - 10a  . diltiazem  240 mg Oral q morning - 10a  . docusate sodium  100 mg Oral BID  . FLUoxetine  10 mg Oral q morning - 10a  . furosemide  40 mg Oral Daily  . gabapentin  300 mg Oral QHS  . insulin aspart  0-9 Units Subcutaneous TID WC  . ipratropium-albuterol  3 mL Nebulization Q6H  . isosorbide mononitrate  60 mg Oral q morning - 10a  . levofloxacin  500 mg Oral Daily  . lidocaine  1 patch Transdermal Daily  . predniSONE  60 mg Oral QAC breakfast  . senna  1 tablet Oral BID   Continuous Infusions:    Assessment/Plan: 1. Toxic metabolic encephalopathy-probably secondary to effect of anesthetics, pain medications-clearing rapidly.  Minimize narcotics 2. Acute hypoxic respiratory failure-atelectasis + acute decompensated chf + underlying COPD -diuresed with IV Lasix x1 with very good result. . Repeat chest x-ray 8/10a.m.= improved CHF - 6.11 L so far for this admission. Echocardiogram 02/25/14= EF 65-70% lasix 40 mg [id number] 60 mg daily  02/24/14.  ECHO didn;t show high RV pressures.  Is improving slowly-Dopplers ordered by pulm are neg for DVt.  No further w/u needed 3. Possible pneumonia-Legionella /S. pneumonia were negative-empiric vancomycin/Zosyn-Narrowed to PO levaquin 8/8. Complete on 02/28/14  4. History CAD-continue isosorbide 60 mg orally daily, aspirin 81 daily.  Keep on telemetry for now. 5. S/p Radical nephrectomy 02/21/14 T2N0Mx renal cell carcinoma two foci -pain control-have  discontinued fentanyl IV every 4 when necessary continue oxycodone  5 mg to 10 mg every 4 when necessary  6. Depression-continue citalopram 40 daily, fluoxetine 10 daily 7. Type 2 diabetes mellitus with neuropathy-continue 4 times a day. Blood sugars 141-296-added lantus 10 U 8/10-will decide on changing to resistant scale if still elevated-likely from steroids 8. Chronic kidney disease stage 3-4-careful with diuresis. Has one functioning kidney now.  So far tolerating diuresis 9. Contraction alkalosis-CO2 33. Monitor carefully under diuresis 10. Pneumoperitoneum seems to be resolving 11.  prior history of cocaine use-stable 12. Elevated LFT's-unclear etiology-resolved-possible R side dHF? 13. COPD-current daily smoker 1/2 ppd.  Added 8/8 Flovent 22 mcg bid, Tiotropium 18 bid and split duoneb component to Albuterol nebs-no wheeze but possible component  Code Status:  full  Family Communication:  no family at bedside  Disposition Plan:change from ICU to SDU status, Up with therapy-re-eval if needs 24/7 care  I will assume care as his issues are primarily medical at this stage   [name], MD  [facility] Pager [phone] 02/26/2014, 8:27 AM    LOS: 5 days

## 2014-02-26 NOTE — Progress Notes (Signed)
CARE MANAGEMENT NOTE 02/26/2014  Patient:  [name], [name]   Account Number:  [id number]  Date Initiated:  02/22/2014  Documentation initiated by:  Baleria Wyman  Subjective/Objective Assessment:   pt with robotic nephrectomy on [id number]/becoame hypoxic in the pacu, placed on a nrb venturi mask with Fi02 at 40-80% transferred to sdu.     Action/Plan:   patient is from home and does live alone/will need to follow for hhc needs.   Anticipated DC Date:  03/01/2014   Anticipated DC Plan:  HOME/SELF CARE  In-house referral  NA      DC Planning Services  NA      [facility] Choice  NA   Choice offered to / List presented to:  NA   DME arranged  NA      DME agency  NA     [place] arranged  NA      [place] agency  NA   Status of service:  In process, will continue to follow Medicare Important Message given?  NA - LOS <3 / Initial given by admissions (If response is "NO", the following Medicare IM given date fields will be blank) Date Medicare IM given:   Medicare IM given by:   Date Additional Medicare IM given:   Additional Medicare IM given by:    Discharge Disposition:    Per UR Regulation:  Reviewed for med. necessity/level of care/duration of stay  If discussed at [place] of Stay Meetings, dates discussed:    Comments:  [id number]/Kathyjo Briere Eldridge Dace, BSN, CCM: [phone] Chart reviewed for discharge needs.  Patient to be moved from sdu area to med-surg floor Toxic metabolic encephalopathy-probably secondary to effect of anesthetics, pain medications-clearing rapidly. Minimize narcotics Acute hypoxic respiratory failure-atelectasis + acute decompensated chf + underlying COPD -diuresed with IV Lasix x1 with very good result. . Repeat chest x-ray 8/10a.m.= improved CHF - 6.11 L so far for this admission. Echocardiogram 02/25/14= EF 65-70% lasix 40 mg [id number] 60 mg daily  02/24/14.  ECHO didn;t show high RV pressures. Is improving slowly-Dopplers ordered by pulm are neg for DVt.  No  further w/u needed Possible pneumonia-Legionella /S. pneumonia were negative-empiric vancomycin/Zosyn-Narrowed to PO levaquin 8/8. Complete on 02/28/14 History CAD-continue isosorbide 60 mg orally daily, aspirin 81 daily.  Keep on telemetry for now. S/p Radical nephrectomy 02/21/14 T2N0Mx renal cell carcinoma two foci -pain control-have discontinued fentanyl IV every 4 when necessary continue oxycodone  5 mg to 10 mg every 4 when necessary Depression-continue citalopram 40 daily, fluoxetine 10 daily Type 2 diabetes mellitus with neuropathy-continue 4 times a day. Blood sugars 141-296-added lantus 10 U 8/10-will decide on changing to resistant scale if still elevated-likely from steroids Chronic kidney disease stage 3-4-careful with diuresis. Has one functioning kidney now.  So far tolerating diuresis Contraction alkalosis-CO2 33. Monitor carefully under diuresis Pneumoperitoneum seems to be resolving prior history of cocaine use-stable Elevated LFT's-unclear etiology-resolved-possible R side dHF? COPD-current daily smoker 1/2 ppd.  Added 8/8 Flovent 22 mcg bid, Tiotropium 18 bid and split duoneb component to Albuterol nebs-no wheeze but possible component If patient can tolerate new o2 flow and med poss dcd after iv abx finished on [id number]    Next chart review due on [id number].  [id number] Velva Harman, RN, BSN, CCM:  [phone] Chart reviewed.  No discharge needs present at time of this review. Next review of chart due on [id number].

## 2014-02-26 NOTE — Progress Notes (Signed)
Pt awake request sit on side of bed and receive breathing treatment. Called RT and neb given. Pt request walk "just a little". Continued O2 sats maintained at 88 to 90. Ambulated approx. 30 feet. Pt very appreciative. [place] @ 6L

## 2014-02-27 LAB — BASIC METABOLIC PANEL
Anion gap: 14 (ref 5–15)
BUN: 39 mg/dL — AB (ref 6–23)
CHLORIDE: 93 meq/L — AB (ref 96–112)
CO2: 27 mEq/L (ref 19–32)
Calcium: 9.9 mg/dL (ref 8.4–10.5)
Creatinine, Ser: 1.55 mg/dL — ABNORMAL HIGH (ref 0.50–1.35)
GFR calc Af Amer: 55 mL/min — ABNORMAL LOW (ref >90)
GFR calc non Af Amer: 48 mL/min — ABNORMAL LOW (ref >90)
Glucose, Bld: 192 mg/dL — ABNORMAL HIGH (ref 70–99)
Potassium: 4.6 mEq/L (ref 3.7–5.3)
Sodium: 134 mEq/L — ABNORMAL LOW (ref 137–147)

## 2014-02-27 LAB — PHOSPHORUS: Phosphorus: 3.2 mg/dL (ref 2.3–4.6)

## 2014-02-27 LAB — GLUCOSE, CAPILLARY
GLUCOSE-CAPILLARY: 173 mg/dL — AB (ref 70–99)
Glucose-Capillary: 166 mg/dL — ABNORMAL HIGH (ref 70–99)

## 2014-02-27 LAB — MAGNESIUM: Magnesium: 2 mg/dL (ref 1.5–2.5)

## 2014-02-27 MED ORDER — FLUTICASONE PROPIONATE HFA 220 MCG/ACT IN AERO: 1.0000 | INHALATION_SPRAY | Freq: Two times a day (BID) | RESPIRATORY_TRACT | Status: DC

## 2014-02-27 MED ORDER — FUROSEMIDE 40 MG PO TABS
40.0000 mg | ORAL_TABLET | Freq: Every day | ORAL | Status: DC
Start: 2014-02-27 — End: 2014-10-13

## 2014-02-27 MED ORDER — ALBUTEROL SULFATE (2.5 MG/3ML) 0.083% IN NEBU: 2.5000 mg | INHALATION_SOLUTION | RESPIRATORY_TRACT | Status: DC | PRN

## 2014-02-27 MED ORDER — HYDROCODONE-ACETAMINOPHEN 5-325 MG PO TABS: 1.0000 | ORAL_TABLET | Freq: Four times a day (QID) | ORAL | Status: DC | PRN

## 2014-02-27 MED ORDER — PREDNISONE 20 MG PO TABS: 40.0000 mg | ORAL_TABLET | Freq: Every day | ORAL | Status: DC

## 2014-02-27 MED ORDER — LEVOFLOXACIN 500 MG PO TABS
500.0000 mg | ORAL_TABLET | Freq: Every day | ORAL | Status: DC
Start: 2014-02-27 — End: 2014-03-21

## 2014-02-27 NOTE — Discharge Summary (Signed)
Physician Discharge Summary  [name] MBW:[id number] DOB: 09/23/1955 DOA: 02/21/2014  PCP: [name], BETTY G, MD  Admit date: 02/21/2014 Discharge date: 02/27/2014  Time spent: 35 minutes  Recommendations for Outpatient Follow-up:  1. Needs outpatient pulmonary followup  2. Consider checking basic metabolic panel CBC in about for 4-5 days 3. Home oxygen ordered 4 L 24 7 4. Complete 1 more dose of Levaquin 8/12 5. Steroid taper ordered to end on 03/09/14 6. Caution with by mouth Lasix 40 daily-consider outpatient cardiology referral intermittently as an out patient   Discharge Diagnoses:  Active Problems:   Essential hypertension, benign   Diabetes mellitus type 2, uncontrolled, with cardiovascular complications   Coronary atherosclerosis of native coronary artery   Renal neoplasm   Acute respiratory failure with hypoxia   Fluid overload   Discharge Condition: Fair  Diet recommendation: Heart healthy low-salt  Filed Weights   02/23/14 0400 02/25/14 0400 02/26/14 0400  Weight: 104.2 kg (229 lb 11.5 oz) 105.5 kg (232 lb 9.4 oz) 104.5 kg (230 lb 6.1 oz)    History of present illness:  58 y/o ?, known h/o CAD s/p PCI->[name] recent cath 12/20/13=non-obst disease, DM ty 2 since 2011, Htn, GAD, Cocaine use, recent cholectomy 12/18/13 admit, Prior TIA '90s, still smoker with COPD admitted electively for R Nephrectomy,  Developed in PACU Acute hypoxic respiratory failure-noted + 4 liters fluids during procedure  Given IV lasix 40 mg x 1 and placed on CPAP  NGt placed as significant pneumoperitoneum-eventually tthis was removed and he tolerated a diet  He remained hypoxic in SDU despite good resolution of mentation and sats still in only mid 80's without oxygen  He was cautiously diuresed 2/2 to nephrectomy, echo ordered and Pulm/CCM consulted due to persistent hypoxia   Hospital Course:  Assessment/Plan:  1. Toxic metabolic encephalopathy-probably secondary to effect of  anesthetics, pain medications-resolved during hospital stay 2. Acute hypoxic respiratory failure-atelectasis + acute decompensated chf + underlying COPD -diuresed with IV Lasix x1 with very good result. . Repeat chest x-ray 8/10a.m.= improved CHF - 6. this admission. Echocardiogram 02/25/14= EF 65-70% lasix 40 mg SV:[id number] 60 mg daily 02/24/14. ECHO didnt show high RV pressures. Is improving slowly-Dopplers ordered by pulm are neg for DVt. No further w/u needed 3. Possible pneumonia-Legionella /[name]-empiric vancomycin/Zosyn-Narrowed to PO levaquin 8/8. Complete on 02/28/14  4. History CAD-continue isosorbide 60 mg orally daily, aspirin 81 daily.  5. S/p Radical nephrectomy 02/21/14 [id number] renal cell carcinoma two foci -pain control-have discontinued fentanyl IV every 4 when necessary continue [name] 5 mg to 10 mg every 4 when necessary  6. Depression-continue citalopram 40 daily, fluoxetine 10 daily 7. Type 2 diabetes mellitus with neuropathy-continue 4 times a day. Blood sugars 141-296-added lantus 10 U 8/10-blood sugars were elevated this hospital stay because of steroids on discharge home he'll continue Kevin oral medications 8. Chronic kidney disease stage 3-4-careful with diuresis. Has one functioning kidney [name]. Will continue by mouth Lasix. Creatinine peaked at 27/1.87 but on discharge was 39/1.55 9. Pneumoperitoneum seems to be resolving 10. prior history of cocaine use-stable 11. Elevated LFT's-unclear etiology-resolved-possible R side dHF? this resolved 12. COPD-current daily smoker 1/2 ppd. Added 8/8 Flovent 220 mcg bid, Tiotropium 18 bid-prescribed duo Neb in addition on discharge   Consultants:  Pulmonary 8/8 Procedures:  None Antibiotics:  Zosyn 8/6-8/8  Vancomycin 8/6--8/8  Levaquin 8/8-8/12/50   Discharge Exam: Filed Vitals:   02/27/14 0816  BP:   Pulse: 68  Temp:  Resp: 18    General: Pleasant oriented  Cardiovascular: S1-S2 no murmur rub or  gallop  Respiratory: Fair no wheeze  Discharge Instructions You were cared for by a hospitalist during your hospital stay. If you have any questions about your discharge medications or the care you received while you were in the hospital after you are discharged, you can call the unit and asked to speak with the hospitalist on call if the hospitalist that took care of you is not available. Once you are discharged, your primary care physician will handle any further medical issues. Please note that NO REFILLS for any discharge medications will be authorized once you are discharged, as it is imperative that you return to your primary care physician (or establish a relationship with a primary care physician if you do not have one) for your aftercare needs so that they can reassess your need for medications and monitor your lab values.  Discharge Instructions   Diet - low sodium heart healthy    Complete by:  As directed      Discharge instructions    Complete by:  As directed   You will need close follow-up as an outpatient with a Lung MD-the appointment should already be set up for you Continue prednisone as we have described to you and in a tapering fashion. Complete 1 more tablet of Levaquin Please continue to use oxygen 4 L a day     Increase activity slowly    Complete by:  As directed             Medication List    STOP taking these medications       aspirin 81 MG chewable tablet     clopidogrel 75 MG tablet  Commonly known as:  PLAVIX     fish oil-omega-3 fatty acids 1000 MG capsule      TAKE these medications       albuterol (2.5 MG/3ML) 0.083% nebulizer solution  Commonly known as:  PROVENTIL  Take 3 mLs (2.5 mg total) by nebulization every 3 (three) hours as needed for wheezing or shortness of breath.     citalopram 40 MG tablet  Commonly known as:  CELEXA  Take 40 mg by mouth every morning.     diltiazem 240 MG 24 hr capsule  Commonly known as:  CARDIZEM CD  Take  240 mg by mouth every morning.     FLUoxetine 10 MG capsule  Commonly known as:  PROZAC  Take 10 mg by mouth every morning.     gabapentin 300 MG capsule  Commonly known as:  NEURONTIN  Take 300 mg by mouth at bedtime.     glipiZIDE 10 MG tablet  Commonly known as:  GLUCOTROL  Take 10 mg by mouth 2 (two) times daily.     HYDROcodone-acetaminophen 5-325 MG per tablet  Commonly known as:  NORCO  Take 1-2 tablets by mouth every 6 (six) hours as needed.     ipratropium-albuterol 0.5-2.5 (3) MG/3ML Soln  Commonly known as:  DUONEB  Take 3 mLs by nebulization every 6 (six) hours as needed (Shortness of breath).     isosorbide mononitrate 60 MG 24 hr tablet  Commonly known as:  IMDUR  Take 60 mg by mouth every morning.     levofloxacin 500 MG tablet  Commonly known as:  LEVAQUIN  Take 1 tablet (500 mg total) by mouth daily.     lisinopril 5 MG tablet  Commonly known as:  PRINIVIL,ZESTRIL  Take 5 mg by mouth 2 (two) times daily.     nitroGLYCERIN 0.4 MG SL tablet  Commonly known as:  NITROSTAT  Place 1 tablet (0.4 mg total) under the tongue every 5 (five) minutes as needed for chest pain.     predniSONE 20 MG tablet  Commonly known as:  DELTASONE  Take 2 tablets (40 mg total) by mouth daily before breakfast.     ranitidine 300 MG tablet  Commonly known as:  ZANTAC  Take 300 mg by mouth every morning.     rosuvastatin 20 MG tablet  Commonly known as:  CRESTOR  Take 20 mg by mouth at bedtime.     sitaGLIPtin 100 MG tablet  Commonly known as:  JANUVIA  Take 100 mg by mouth every morning.       No Known Allergies     Follow-up Information   Follow up with [name], MD On 03/08/2014. (at 11:15)    Specialty:  Urology   Contact information:   [place] [address] [phone]       Follow up with [facility], MD On 03/21/2014. (Appt at 2:30 for COPD follow up. )    Specialty:  Pulmonary Disease   Contact information:   [address]  [place] [address] [phone]        The results of significant diagnostics from this hospitalization (including imaging, microbiology, ancillary and laboratory) are listed below for reference.    Significant Diagnostic Studies: Dg Chest 2 View  02/23/2014   CLINICAL DATA:  Pneumonia, shortness of Breath  EXAM: CHEST  2 VIEW  COMPARISON:  02/22/2014  FINDINGS: Cardiomegaly again noted. Study is limited by poor inspiration. Persistent bilateral basilar atelectasis or infiltrate. No pulmonary edema. Again noted pneumoperitoneum.  IMPRESSION: Persistent bilateral basilar atelectasis or infiltrate. No pulmonary edema. Limited study by poor inspiration.   Electronically Signed   By: Lahoma Crocker M.D.   On: 02/23/2014 10:43   Dg Chest 2 View  02/14/2014   CLINICAL DATA:  Nonproductive cough  EXAM: CHEST  2 VIEW  COMPARISON:  12/30/2013  FINDINGS: The heart size and mediastinal contours are within normal limits. Both lungs are clear. The visualized skeletal structures are unremarkable.  IMPRESSION: No active cardiopulmonary disease.   Electronically Signed   By: Kathreen Devoid   On: 02/14/2014 13:21   Dg Abd 1 View  02/22/2014   CLINICAL DATA:  Abdominal distention and pain.  EXAM: ABDOMEN - 1 VIEW  COMPARISON:  12/20/2013  FINDINGS: No evidence of bowel obstruction. There is moderate gaseous distension of the stomach. No concerning intra-abdominal mass effect or calcification. Pneumoperitoneum visible along the inferior liver surface.  IMPRESSION: 1. Nonobstructive bowel gas pattern. Moderate gaseous distention of the stomach. 2. Pneumoperitoneum after recent laparoscopic abdominal surgery.   Electronically Signed   By: Jorje Guild M.D.   On: 02/22/2014 01:42   Dg Chest Port 1 View  02/26/2014   CLINICAL DATA:  Respiratory failure.  EXAM: PORTABLE CHEST - 1 VIEW  COMPARISON:  02/25/2014  FINDINGS: Numerous leads and wires project over the chest. Midline trachea. Normal heart size for level of  inspiration. No pleural effusion or pneumothorax. Improvement in pulmonary interstitial thickening. Patchy left base opacities are similar, given differences in technique. Improved right basilar aeration.  IMPRESSION: Similar patchy left base opacities, favored to represent atelectasis. Minimal infection could look similar.  Otherwise, improved aeration with resolution of pulmonary venous congestion and right base airspace disease.  Electronically Signed   By: Abigail Miyamoto M.D.   On: 02/26/2014 07:10   Dg Chest Port 1 View  02/25/2014   CLINICAL DATA:  Follow-up atelectasis.  EXAM: PORTABLE CHEST - 1 VIEW  COMPARISON:  Chest radiograph performed 02/24/2014  FINDINGS: The lungs remain mildly hypoexpanded. Vascular congestion is again seen, with increased interstitial markings, likely reflecting mild pulmonary edema. Bibasilar airspace opacities likely reflect atelectasis, relatively stable from the prior study, though pneumonia might have a similar appearance. No significant pleural effusion or pneumothorax is seen.  The cardiomediastinal silhouette is mildly enlarged. No acute osseous abnormalities are identified.  IMPRESSION: 1. Lungs mildly hypoexpanded. Bibasilar airspace opacities likely reflect atelectasis, relatively stable from the prior study, though pneumonia might have a similar appearance. 2. Vascular congestion and mild cardiomegaly noted, with increased interstitial markings, likely reflecting mild persistent pulmonary edema.   Electronically Signed   By: Garald Balding M.D.   On: 02/25/2014 06:43   Dg Chest Port 1 View  02/24/2014   CLINICAL DATA:  58 year old male with hypoxemia. History of renal cell carcinoma.  EXAM: PORTABLE CHEST - 1 VIEW  COMPARISON:  02/23/2014 and prior radiographs dating back to 11/09/2004  FINDINGS: Cardiomegaly identified.  Mild interstitial opacities are present in may represent interstitial edema.  Bibasilar atelectasis again is noted in this low volume film.  There  is no evidence of pneumothorax.  The cardiomediastinal silhouette is stable.  IMPRESSION: Cardiomegaly with mild interstitial opacities which may represent mild interstitial edema.  Bibasilar atelectasis and is low volume film.   Electronically Signed   By: Hassan Rowan M.D.   On: 02/24/2014 16:55   Dg Chest Port 1 View  02/22/2014   CLINICAL DATA:  Hypoxia and shortness of breath  EXAM: PORTABLE CHEST - 1 VIEW  COMPARISON:  02/14/2014  FINDINGS: Low lung volumes with bibasilar opacities that containing air bronchograms. Interstitial coarsening in the upper lungs, likely from interstitial crowding and atelectasis. No edema suspected. Likely no cardiomegaly when considering the low lung volumes.  There is a moderate volume of pneumoperitoneum in the upper abdomen. Noted recent laparoscopic abdominal surgery.  IMPRESSION: 1. Low lung volumes with bibasilar atelectasis or aspiration. 2. Moderate pneumoperitoneum status post recent laparoscopic abdominal surgery.   Electronically Signed   By: Jorje Guild M.D.   On: 02/22/2014 01:40   Dg Abd Portable 1v  02/22/2014   CLINICAL DATA:  Nasogastric tube placement.  EXAM: PORTABLE ABDOMEN - 1 VIEW  COMPARISON:  Abdominal sonogram February 22, 2014 at 1:23 a.m.  FINDINGS: Nasogastric fluid looped in proximal stomach with distal tip projecting at gastric antrum. Surgical clips in the included right abdomen likely reflect cholecystectomy. Nonspecific bowel gas pattern. Pneumoperitoneum consistent with recent surgery.  IMPRESSION: Nasogastric tube tip projects in gastric antrum.   Electronically Signed   By: Elon Alas   On: 02/22/2014 03:54    Microbiology: Recent Results (from the past 240 hour(s))  MRSA PCR SCREENING     Status: None   Collection Time    02/22/14  3:00 AM      Result Value Ref Range Status   MRSA by PCR [name]  [name] Final   Comment:            The GeneXpert MRSA Assay (FDA     approved for NASAL specimens     only), is one component  of a     comprehensive MRSA colonization     surveillance program. It is not  intended to diagnose MRSA     infection nor to guide or     monitor treatment for     MRSA infections.     Labs: Basic Metabolic Panel:  Recent Labs Lab 02/23/14 0340 02/24/14 0718 02/25/14 0354 02/26/14 0515 02/27/14 0446  NA 133* 131* 137 140 134*  K 4.6 4.5 4.7 4.9 4.6  CL 94* 91* 94* 98 93*  CO2 29 29 33* 30 27  GLUCOSE 131* 141* 185* 210* 192*  BUN 16 19 27* 33* 39*  CREATININE 1.68* 1.68* 1.87* 1.64* 1.55*  CALCIUM 9.2 9.3 9.7 9.6 9.9  MG  --   --   --  2.4 2.0  PHOS  --   --   --  3.0  3.1 3.2   Liver Function Tests:  Recent Labs Lab 02/22/14 0300 02/22/14 0955 02/23/14 0340 02/25/14 0354 02/26/14 0515  AST 81* 114* 103* 22  --   ALT 62* 86* 105* 52  --   ALKPHOS 82 86 79 82  --   BILITOT 0.4 0.6 0.9 0.4  --   PROT 7.0 7.3 7.3 7.7  --   ALBUMIN 3.1* 3.1* 3.0* 2.8* 2.8*   No results found for this basename: LIPASE, AMYLASE,  in the last 168 hours No results found for this basename: AMMONIA,  in the last 168 hours CBC:  Recent Labs Lab 02/21/14 1238 02/22/14 0047 02/22/14 0300 02/23/14 0340 02/25/14 0354  WBC  --   --  12.9* 15.3* 12.8*  NEUTROABS  --   --  11.0* 12.9* 11.1*  HGB 13.8 13.1 13.2 13.7 12.6*  HCT 42.5 40.2 40.8 41.9 38.7*  MCV  --   --  86.8 86.6 85.6  PLT  --   --  213 216 222   Cardiac Enzymes:  Recent Labs Lab 02/26/14 0515  TROPONINI <0.30   BNP: BNP (last 3 results)  Recent Labs  12/18/13 1158 12/27/13 0944 02/24/14 1620  PROBNP 54.7 234.5* 234.9*   CBG:  Recent Labs Lab 02/26/14 0735 02/26/14 1135 02/26/14 1720 02/27/14 0303 02/27/14 0702  GLUCAP 141* 175* 248* 173* 166*       Signed:  Nita Sells  [facility] 02/27/2014, 9:53 AM

## 2014-02-27 NOTE — Progress Notes (Signed)
Spoke with pt concerning home health needs. Pt states that he will not need HHPT or HHRN at present time. Home O2 from [facility].  Pt's O2 sats 87% at rest on room air.

## 2014-02-27 NOTE — Progress Notes (Signed)
PULMONARY / CRITICAL CARE MEDICINE   Name: [name] MRN: [id number] DOB: [birth year]    ADMISSION DATE:  02/21/2014 CONSULTATION DATE:  02/21/2014  REFERRING MD :  Lynne Logan  CHIEF COMPLAINT:  Hypoxia  INITIAL PRESENTATION:  58 y/o male, smoker, with hx of COPD had rt nephrectomy on 8/05 (eventual dx T2NoM0 renal cell Ca).  He had problems with lethargy and hypoxia after surgery.  Mental status has improved, but he requires supplemental oxygen.  He has been given lasix and had good diuresis >> not much improvement in oxygenation.  He had abdominal distention after surgery and had NG tube to decompression abdomen >> again not much improvement in oxygenation.  He was tried on BiPAP, but had difficulty tolerating this.   SIGNIFICANT EVENTS: 8/05 Rt nephrectomy 8/06 Lethargic, hypoxia, transfer to SDU, [facility] consulted 8/09 echo -nml LV fn 8/09 duplex neg BLE 8/11 Requiring 4L O2 with sats ~90-92%  SUBJECTIVE:    VITAL SIGNS: Temp:  [97.6 F (36.4 C)-97.8 F (36.6 C)] 97.8 F (36.6 C) (08/11 0609) Pulse Rate:  [68-81] 68 (08/11 0816) Resp:  [16-18] 18 (08/11 0816) BP: (119-141)/(66-85) 119/69 mmHg (08/11 0609) SpO2:  [90 %-94 %] 90 % (08/11 0816)  INTAKE / OUTPUT:  Intake/Output Summary (Last 24 hours) at 02/27/14 0903 Last data filed at 02/26/14 1600  Gross per 24 hour  Intake    360 ml  Output    300 ml  Net     60 ml   PHYSICAL EXAMINATION: General: pleasant, no distress Neuro:  Alert, normal strength, CN intact HEENT: Pupils reactive, no sinus tenderness, JVD Cardiovascular:  Regular, no murmur Lungs:  Decreased breath sounds, clear, no wheeze Abdomen:  Soft, mild distention, wounds sites clean, normal bowel sounds Musculoskeletal:  No edema Skin:  No rashes  LABS:  CBC  Recent Labs Lab 02/22/14 0300 02/23/14 0340 02/25/14 0354  HGB 13.2 13.7 12.6*  HCT 40.8 41.9 38.7*  WBC 12.9* 15.3* 12.8*  PLT 213 216 222   CARDIAC  Recent Labs Lab  02/26/14 0515  TROPONINI <0.30    Recent Labs Lab 02/24/14 1620  PROBNP 234.9*   CHEMISTRY  Recent Labs Lab 02/23/14 0340 02/24/14 0718 02/25/14 0354 02/26/14 0515 02/27/14 0446  NA 133* 131* 137 140 134*  K 4.6 4.5 4.7 4.9 4.6  CL 94* 91* 94* 98 93*  CO2 29 29 33* 30 27  GLUCOSE 131* 141* 185* 210* 192*  BUN 16 19 27* 33* 39*  CREATININE 1.68* 1.68* 1.87* 1.64* 1.55*  CALCIUM 9.2 9.3 9.7 9.6 9.9  MG  --   --   --  2.4 2.0  PHOS  --   --   --  3.0  3.1 3.2   Estimated Creatinine Clearance: 65 ml/min (by C-G formula based on Cr of 1.55).  LIVER  Recent Labs Lab 02/22/14 0300 02/22/14 0955 02/23/14 0340 02/25/14 0354 02/26/14 0515  AST 81* 114* 103* 22  --   ALT 62* 86* 105* 52  --   ALKPHOS 82 86 79 82  --   BILITOT 0.4 0.6 0.9 0.4  --   PROT 7.0 7.3 7.3 7.7  --   ALBUMIN 3.1* 3.1* 3.0* 2.8* 2.8*  INR 1.04  --   --   --   --    ENDOCRINE CBG (last 3)   Recent Labs  02/26/14 1720 02/27/14 0303 02/27/14 0702  GLUCAP 248* 173* 166*     IMAGING  Dg Chest Port 1 [name]  02/26/2014   CLINICAL DATA:  Respiratory failure.  EXAM: PORTABLE CHEST - 1 VIEW  COMPARISON:  02/25/2014  FINDINGS: Numerous leads and wires project over the chest. Midline trachea. Normal heart size for level of inspiration. No pleural effusion or pneumothorax. Improvement in pulmonary interstitial thickening. Patchy left base opacities are similar, given differences in technique. Improved right basilar aeration.  IMPRESSION: Similar patchy left base opacities, favored to represent atelectasis. Minimal infection could look similar.  Otherwise, improved aeration with resolution of pulmonary venous congestion and right base airspace disease.   Electronically Signed   By: Abigail Miyamoto M.D.   On: 02/26/2014 07:10     ASSESSMENT / PLAN:  PULMONARY A: Acute hypoxic respiratory failure - multifactorial in setting of hypoventilation from decreased abdominal compliance, atelectasis, and hx of  COPD. Tobacco abuse. COPD  ATX vs PNA  P:   Scheduled Duoneb Q6 & Q3 PRN Albuterol for discharge Will assess outpatient regimen in office at follow up Continue prednisone - taper over 2 weeks Pulmonary hygiene: IS, PT, OOB Assess O2 needs prior to discharge, will likely need acutely post discharge - rec 4L O2  Lasix for negative balance as tolerated D7/7 ABX, currently Levaquin  Smoking cessation Wean steroids to off over 1 week Follow up with Dr. Chase Caller post discharge (arranged) Cautioned regarding smoking & O2     Other Active Issues Per [facility]  Hx of CAD  HTN Hyperlipidemia. Rt renal mass s/p nephrectomy. Abdominal distention after surgery. Hx of GERD. Hep C Ab neg. Leukocytosis Steroid induced hyperglycemia. Hx of Depression, Anxiety. Post-op pain control. Hx of cocaine abuse.   Summary - COPD + atelectais as cause of hypoxia, needs home O2, Smoking cessation emphasized, out pt FU arranged PCCm to sign off   Noe Gens, NP-C [facility] Pgr: [phone] or [phone]  Rigoberto Noel MD 02/27/2014 9:03 AM

## 2014-02-27 NOTE — Progress Notes (Signed)
O2 sat at rest 87%, RA.

## 2014-02-27 NOTE — Progress Notes (Signed)
6 Days Post-Op  Subjective:  1 - Large Right Renal Mass - s/p robotic right radical nephrectomy and extensive adhesiolysis 02/21/14 for pT2N0Mx clear cell renal cell carcinoma with negative margins. Pre-op staging without adenopathy or chest lesions.   2 - Renal Insufficiency - Baseline Cr 1.0. Now s/p nephrectomy. Cr, K acceptable on daily labs thus far, Cr 1.6's and stable.   3 - Post-operative Hypoxia - Transferred to ICU 8/6 for progressive somnolence, hypoxia. Critical care consultation sought and began empiric therapy for possible aspiration and optimizing pulm parmaters (NG for gastric decompression, diuresis), Began non-rereather. LE duplex and TTE negative for DVT or acute cardiac decompensation. Making progress now on 2-3L [place].   Today [name] is stable. Still some mild abd soreness managed by PO meds. Good diet and bowel function, ambulatory with [place] O2 w/o SOB.  Objective: Vital signs in last 24 hours: Temp:  [97.5 F (36.4 C)-97.8 F (36.6 C)] 97.8 F (36.6 C) (08/11 0609) Pulse Rate:  [67-81] 72 (08/11 0609) Resp:  [12-16] 16 (08/11 0609) BP: (119-141)/(61-85) 119/69 mmHg (08/11 0609) SpO2:  [90 %-94 %] 92 % (08/11 0609) Last BM Date: 02/21/14  Intake/Output from previous day: 08/10 0701 - 08/11 0700 In: 600 [P.O.:600] Out: 550 [Urine:550] Intake/Output this shift:    General appearance: alert, cooperative, appears stated age and appears at baseline Head: Normocephalic, without obvious abnormality, atraumatic Nose: Nares normal. Septum midline. Mucosa normal. No drainage or sinus tenderness., [place] O2 at 2.5L Throat: lips, mucosa, and tongue normal; teeth and gums normal Back: symmetric, no curvature. ROM normal. No CVA tenderness. Resp: non-labored Chest wall: no tenderness Cardio: Nl rate GI: soft, non-tender; bowel sounds normal; no masses,  no organomegaly Extremities: extremities normal, atraumatic, no cyanosis or edema and SCD's in place Pulses: 2+ and  symmetric Skin: Skin color, texture, turgor normal. No rashes or lesions Lymph nodes: Cervical, supraclavicular, and axillary nodes normal. Neurologic: Grossly normal Incision/Wound: All recent port and extraction sites c/d/i. No hernias.   Lab Results:   Recent Labs  02/25/14 0354  WBC 12.8*  HGB 12.6*  HCT 38.7*  PLT 222   BMET  Recent Labs  02/26/14 0515 02/27/14 0446  NA 140 134*  K 4.9 4.6  CL 98 93*  CO2 30 27  GLUCOSE 210* 192*  BUN 33* 39*  CREATININE 1.64* 1.55*  CALCIUM 9.6 9.9   PT/INR No results found for this basename: LABPROT, INR,  in the last 72 hours ABG No results found for this basename: PHART, PCO2, PO2, HCO3,  in the last 72 hours  Studies/Results: Dg Chest Port 1 View  02/26/2014   CLINICAL DATA:  Respiratory failure.  EXAM: PORTABLE CHEST - 1 VIEW  COMPARISON:  02/25/2014  FINDINGS: Numerous leads and wires project over the chest. Midline trachea. Normal heart size for level of inspiration. No pleural effusion or pneumothorax. Improvement in pulmonary interstitial thickening. Patchy left base opacities are similar, given differences in technique. Improved right basilar aeration.  IMPRESSION: Similar patchy left base opacities, favored to represent atelectasis. Minimal infection could look similar.  Otherwise, improved aeration with resolution of pulmonary venous congestion and right base airspace disease.   Electronically Signed   By: Abigail Miyamoto M.D.   On: 02/26/2014 07:10    Anti-infectives: Anti-infectives   Start     Dose/Rate Route Frequency Ordered Stop   02/24/14 1000  levofloxacin (LEVAQUIN) tablet 500 mg     500 mg Oral Daily 02/24/14 0825     02/22/14  1800  vancomycin (VANCOCIN) IVPB 1000 mg/200 mL premix  Status:  Discontinued     1,000 mg 200 mL/hr over 60 Minutes Intravenous Every 12 hours 02/22/14 0938 02/22/14 0942   02/22/14 1400  piperacillin-tazobactam (ZOSYN) IVPB 3.375 g  Status:  Discontinued     3.375 g 12.5 mL/hr over  240 Minutes Intravenous 3 times per day 02/22/14 0534 02/24/14 0828   02/22/14 1100  vancomycin (VANCOCIN) IVPB 1000 mg/200 mL premix  Status:  Discontinued     1,000 mg 200 mL/hr over 60 Minutes Intravenous 2 times daily 02/22/14 0942 02/24/14 0807   02/22/14 0600  vancomycin (VANCOCIN) 1,250 mg in sodium chloride 0.9 % 250 mL IVPB  Status:  Discontinued     1,250 mg 166.7 mL/hr over 90 Minutes Intravenous Every 12 hours 02/22/14 0534 02/22/14 0938   02/22/14 0545  piperacillin-tazobactam (ZOSYN) IVPB 3.375 g     3.375 g 100 mL/hr over 30 Minutes Intravenous  Once 02/22/14 0534 02/22/14 0624   02/21/14 0707  ceFAZolin (ANCEF) IVPB 2 g/50 mL premix     2 g 100 mL/hr over 30 Minutes Intravenous 30 min pre-op 02/21/14 0707 02/21/14 0900      Assessment/Plan:  1 - Large Right Renal Mass - doing well from surgery perspective post-op. We will arrange for f/u in office in abotu 2-3 weeks as well as surveillance studies in about 22mos.  2 - Renal nsufficiency - GFR and lytes remain acceptable post nephrectomy, it appears he is at new baseline and tollerating.   3 - Post-operative Hypoxia - Greatly appreciate pulm and medicine help as well as transfer to medicine service, pt certainly amenable to home O2.   [facility], Saraiah Bhat 02/27/2014

## 2014-03-21 ENCOUNTER — Ambulatory Visit (INDEPENDENT_AMBULATORY_CARE_PROVIDER_SITE_OTHER): Payer: Medicaid Other | Admitting: Internal Medicine

## 2014-03-21 ENCOUNTER — Ambulatory Visit (INDEPENDENT_AMBULATORY_CARE_PROVIDER_SITE_OTHER)
Admission: RE | Admit: 2014-03-21 | Discharge: 2014-03-21 | Disposition: A | Discharge status: Home / Self Care | Payer: Medicaid Other | Source: Ambulatory Visit | Attending: Internal Medicine | Admitting: Internal Medicine

## 2014-03-21 ENCOUNTER — Encounter: Payer: Self-pay | Admitting: Internal Medicine

## 2014-03-21 VITALS — BP 106/62 | HR 82 | Ht 69.0 in | Wt 239.0 lb

## 2014-03-21 DIAGNOSIS — J9621 Acute and chronic respiratory failure with hypoxia: Secondary | ICD-10-CM

## 2014-03-21 DIAGNOSIS — J9600 Acute respiratory failure, unspecified whether with hypoxia or hypercapnia: Secondary | ICD-10-CM

## 2014-03-21 DIAGNOSIS — R0902 Hypoxemia: Secondary | ICD-10-CM

## 2014-03-21 DIAGNOSIS — J9601 Acute respiratory failure with hypoxia: Secondary | ICD-10-CM

## 2014-03-21 DIAGNOSIS — J9620 Acute and chronic respiratory failure, unspecified whether with hypoxia or hypercapnia: Secondary | ICD-10-CM

## 2014-03-21 NOTE — Patient Instructions (Signed)
Glad you are better from breathing stand point Refer sleep doctor to ensure sleep apnea ruled out Do CXR 2 view today  03/21/2014; will call with results Do full PFT breathing test next few weeks Return to see my NP Tammy after that for med calendar and followup visit Til then continue your inhalers

## 2014-03-21 NOTE — Progress Notes (Signed)
Subjective:    Patient ID: [name], male    DOB: 1955-09-07, 58 y.o.   MRN: [id number]  HPI     OV  03/21/2014  Chief Complaint  Patient presents with  . Follow-up    HFU- Pt was d/c from [facility] for acute respiratory failure.     58 year old male recently hospitalized for nephrectomy for renal cell carcinoma and in the postoperative phase had acute respiratory failure with possible postoperative pneumonia. He was seen by inpatient pulmonary. He was discharged approximately 3 weeks ago. Since then he says he is feeling better. He continues to wear oxygen. Preadmission he was not wearing oxygen. He uses Flovent inhaler. I do not see any other inhaler with him. However he has all his medications in a bag and he seems to be compliant with it. He notes that his respiratory status is stable. He denies prior existence of COPD. Currently denies any chest pain.  Most of the symptoms this visit focused on the fact that he has chronic excessive daytime somnolence. Unclear if he has a baseline history of snoring because he lives alone. He also complains of chronic urinary problems after his surgery. Denies any cough   reports that he has been smoking Cigarettes.  He has a 20.5 pack-year smoking history. He has never used smokeless tobacco.  Review of Systems  Constitutional: Negative for fever and unexpected weight change.  HENT: Negative for congestion, dental problem, ear pain, nosebleeds, postnasal drip, rhinorrhea, sinus pressure, sneezing, sore throat and trouble swallowing.   Eyes: Negative for redness and itching.  Respiratory: Positive for cough and shortness of breath. Negative for chest tightness and wheezing.   Cardiovascular: Positive for chest pain. Negative for palpitations and leg swelling.  Gastrointestinal: Negative for nausea and vomiting.  Genitourinary: Negative for dysuria.  Musculoskeletal: Negative for joint swelling.  Skin: Negative for rash.  Neurological: Negative  for headaches.  Hematological: Does not bruise/bleed easily.  Psychiatric/Behavioral: Negative for dysphoric mood. The patient is not nervous/anxious.        Objective:   Physical Exam  Nursing note and vitals reviewed. Constitutional: He is oriented to person, place, and time. He appears well-developed and well-nourished. No distress.  Obese Falls asleep easily - says baseline  HENT:  Head: Normocephalic and atraumatic.  Right Ear: External ear normal.  Left Ear: External ear normal.  Mouth/Throat: Oropharynx is clear and moist. No oropharyngeal exudate.  o2 on  Eyes: Conjunctivae and EOM are normal. Pupils are equal, round, and reactive to light. Right eye exhibits no discharge. Left eye exhibits no discharge. No scleral icterus.  Neck: Normal range of motion. Neck supple. No JVD present. No tracheal deviation present. No thyromegaly present.  Cardiovascular: Normal rate, regular rhythm and intact distal pulses.  Exam reveals no gallop and no friction rub.   No murmur heard. Pulmonary/Chest: Effort normal and breath sounds normal. No respiratory distress. He has no wheezes. He has no rales. He exhibits no tenderness.  Abdominal: Soft. Bowel sounds are normal. He exhibits no distension and no mass. There is no tenderness. There is no rebound and no guarding.  Musculoskeletal: Normal range of motion. He exhibits no edema and no tenderness.  Lymphadenopathy:    He has no cervical adenopathy.  Neurological: He is alert and oriented to person, place, and time. He has normal reflexes. No cranial nerve deficit. Coordination normal.  Skin: Skin is warm and dry. No rash noted. He is not diaphoretic. No erythema. No pallor.  Psychiatric: He has a normal mood and affect. His behavior is normal. Judgment and thought content normal.  Flat affect    Filed Vitals:   03/21/14 1431  BP: 106/62  Pulse: 82  Height: 5\' 9"  (1.753 m)  Weight: 239 lb (108.41 kg)  SpO2: 96%         Assessment  & Plan:  Glad you are better from breathing stand point Refer sleep doctor to ensure sleep apnea ruled out Do CXR 2 view today  03/21/2014; will call with results Do full PFT breathing test next few weeks Return to see my NP Tammy after that for med calendar and followup visit Til then continue your inhalers

## 2014-04-03 NOTE — Progress Notes (Signed)
Quick Note:  lmtcb ______ 

## 2014-04-04 NOTE — Progress Notes (Signed)
Quick Note:  lmtcb ______ 

## 2014-04-05 NOTE — Assessment & Plan Note (Signed)
Glad you are better from breathing stand point Refer sleep doctor to ensure sleep apnea ruled out Do CXR 2 view today  03/21/2014; will call with results Do full PFT breathing test next few weeks Return to see my NP Tammy after that for med calendar and followup visit Til then continue your inhalers

## 2014-04-13 ENCOUNTER — Ambulatory Visit (INDEPENDENT_AMBULATORY_CARE_PROVIDER_SITE_OTHER): Payer: Medicaid Other | Admitting: Internal Medicine

## 2014-04-13 ENCOUNTER — Encounter: Payer: Self-pay | Admitting: Internal Medicine

## 2014-04-13 VITALS — BP 122/72 | HR 77 | Ht 70.0 in | Wt 234.0 lb

## 2014-04-13 DIAGNOSIS — R0600 Dyspnea, unspecified: Secondary | ICD-10-CM | POA: Insufficient documentation

## 2014-04-13 DIAGNOSIS — R0609 Other forms of dyspnea: Secondary | ICD-10-CM

## 2014-04-13 DIAGNOSIS — R0689 Other abnormalities of breathing: Principal | ICD-10-CM

## 2014-04-13 DIAGNOSIS — J9621 Acute and chronic respiratory failure with hypoxia: Secondary | ICD-10-CM

## 2014-04-13 DIAGNOSIS — R0902 Hypoxemia: Secondary | ICD-10-CM

## 2014-04-13 DIAGNOSIS — R0989 Other specified symptoms and signs involving the circulatory and respiratory systems: Secondary | ICD-10-CM

## 2014-04-13 DIAGNOSIS — J9620 Acute and chronic respiratory failure, unspecified whether with hypoxia or hypercapnia: Secondary | ICD-10-CM

## 2014-04-13 LAB — PULMONARY FUNCTION TEST
DL/VA % PRED: 63 %
DL/VA: 2.93 ml/min/mmHg/L
DLCO unc % pred: 55 %
DLCO unc: 17.92 ml/min/mmHg
FEF 25-75 PRE: 4.22 L/s
FEF 25-75 Post: 4.47 L/sec
FEF2575-%CHANGE-POST: 5 %
FEF2575-%PRED-PRE: 139 %
FEF2575-%Pred-Post: 147 %
FEV1-%CHANGE-POST: 2 %
FEV1-%PRED-PRE: 97 %
FEV1-%Pred-Post: 100 %
FEV1-PRE: 3.58 L
FEV1-Post: 3.67 L
FEV1FVC-%Change-Post: 1 %
FEV1FVC-%Pred-Pre: 107 %
FEV6-%CHANGE-POST: 1 %
FEV6-%PRED-POST: 95 %
FEV6-%Pred-Pre: 94 %
FEV6-Post: 4.4 L
FEV6-Pre: 4.33 L
FEV6FVC-%Change-Post: 0 %
FEV6FVC-%PRED-PRE: 104 %
FEV6FVC-%Pred-Post: 104 %
FVC-%Change-Post: 1 %
FVC-%Pred-Post: 92 %
FVC-%Pred-Pre: 90 %
FVC-POST: 4.43 L
FVC-Pre: 4.37 L
POST FEV1/FVC RATIO: 83 %
Post FEV6/FVC ratio: 100 %
Pre FEV1/FVC ratio: 82 %
Pre FEV6/FVC Ratio: 100 %
RV % pred: 76 %
RV: 1.7 L
TLC % pred: 87 %
TLC: 6.11 L

## 2014-04-13 NOTE — Progress Notes (Signed)
Subjective:    Patient ID: [name], male    DOB: Apr 29, 1956, 58 y.o.   MRN: [id number]  HPI  OV  03/21/2014  Chief Complaint  Patient presents with  . Follow-up    HFU- Pt was d/c from [facility] for acute respiratory failure.    58 year old male recently hospitalized for nephrectomy for renal cell carcinoma and in the postoperative phase had acute respiratory failure with possible postoperative pneumonia. He was seen by inpatient pulmonary. He was discharged approximately 3 weeks ago. Since then he says he is feeling better. He continues to wear oxygen. Preadmission he was not wearing oxygen. He uses Flovent inhaler. I do not see any other inhaler with him. However he has all his medications in a bag and he seems to be compliant with it. He notes that his respiratory status is stable. He denies prior existence of COPD. Currently denies any chest pain.  Most of the symptoms this visit focused on the fact that he has chronic excessive daytime somnolence. Unclear if he has a baseline history of snoring because he lives alone. He also complains of chronic urinary problems after his surgery. Denies any cough   reports that he has been smoking Cigarettes.  He has a 20.5 pack-year smoking history. He has never used smokeless tobacco.  REC Glad you are better from breathing stand point Refer sleep doctor to ensure sleep apnea ruled out Do CXR 2 view today  03/21/2014; will call with results Do full PFT breathing test next few weeks Return to see my NP Tammy after that for med calendar and followup visit Til then continue your inhalers  OV 04/13/2014  Chief Complaint  Patient presents with  . Follow-up    Pt states his breathing is unchanged since last OV. Pt states he is wearing nocturnal O2 at 4lpm and it is helping. Pt c/o DOE, dry cough. Pt denies CP/tightness.    FU post hospital dyspnea   - Poor historian. Says better but then also reports class 2-3 dyspnea on exertion relieved by  rest. NEbs and flovent and night o2 not helping. There is ass CAD and he does have exertional chest pain; has appt due with cards mid-oct 2015. Walking deat: 2nd lap of 185 feet - dyspneic, and desaturated 04/13/2014. Denies active wheeze or cough or hemoptysis or edema or fever or chills. PFT 04/13/2014 review and only has isolated low DLCO 55%.  Review of Systems  Constitutional: Negative for fever and unexpected weight change.  HENT: Negative for congestion, dental problem, ear pain, nosebleeds, postnasal drip, rhinorrhea, sinus pressure, sneezing, sore throat and trouble swallowing.   Eyes: Negative for redness and itching.  Respiratory: Positive for cough and shortness of breath. Negative for chest tightness and wheezing.   Cardiovascular: Positive for leg swelling. Negative for palpitations.  Gastrointestinal: Negative for nausea and vomiting.  Genitourinary: Positive for difficulty urinating. Negative for dysuria.  Musculoskeletal: Negative for joint swelling.  Skin: Negative for rash.  Neurological: Negative for headaches.  Hematological: Does not bruise/bleed easily.  Psychiatric/Behavioral: Negative for dysphoric mood. The patient is not nervous/anxious.    Current outpatient prescriptions:albuterol (PROVENTIL HFA;VENTOLIN HFA) 108 (90 BASE) MCG/ACT inhaler, Inhale 2 puffs into the lungs every 6 (six) hours as needed for wheezing or shortness of breath., Disp: , Rfl: ;  albuterol (PROVENTIL) (2.5 MG/3ML) 0.083% nebulizer solution, Take 3 mLs (2.5 mg total) by nebulization every 3 (three) hours as needed for wheezing or shortness of breath., Disp: 75 mL, Rfl:  12 alfuzosin (UROXATRAL) 10 MG 24 hr tablet, Take 10 mg by mouth daily with breakfast., Disp: , Rfl: ;  citalopram (CELEXA) 40 MG tablet, Take 40 mg by mouth every morning., Disp: , Rfl: ;  diltiazem (CARDIZEM CD) 240 MG 24 hr capsule, Take 240 mg by mouth every morning., Disp: , Rfl: ;  FLUoxetine (PROZAC) 10 MG capsule, Take 10 mg by  mouth every morning., Disp: , Rfl:  fluticasone (FLOVENT HFA) 220 MCG/ACT inhaler, Inhale 1 puff into the lungs 2 (two) times daily., Disp: 1 Inhaler, Rfl: 12;  furosemide (LASIX) 40 MG tablet, Take 1 tablet (40 mg total) by mouth daily., Disp: 30 tablet, Rfl: 0;  gabapentin (NEURONTIN) 300 MG capsule, Take 300 mg by mouth at bedtime., Disp: , Rfl: ;  glipiZIDE (GLUCOTROL) 10 MG tablet, Take 10 mg by mouth 2 (two) times daily., Disp: , Rfl:  HYDROcodone-acetaminophen (NORCO) 5-325 MG per tablet, Take 1-2 tablets by mouth every 6 (six) hours as needed., Disp: 30 tablet, Rfl: 0;  ipratropium-albuterol (DUONEB) 0.5-2.5 (3) MG/3ML SOLN, Take 3 mLs by nebulization every 6 (six) hours as needed (Shortness of breath)., Disp: , Rfl: ;  isosorbide mononitrate (IMDUR) 60 MG 24 hr tablet, Take 60 mg by mouth every morning. , Disp: , Rfl:  nitroGLYCERIN (NITROSTAT) 0.4 MG SL tablet, Place 1 tablet (0.4 mg total) under the tongue every 5 (five) minutes as needed for chest pain., Disp: 30 tablet, Rfl: 1;  ranitidine (ZANTAC) 300 MG tablet, Take 300 mg by mouth every morning. , Disp: , Rfl: ;  sitaGLIPtin (JANUVIA) 100 MG tablet, Take 100 mg by mouth every morning. , Disp: , Rfl: ;  solifenacin (VESICARE) 5 MG tablet, Take 5 mg by mouth daily., Disp: , Rfl:  atorvastatin (LIPITOR) 20 MG tablet, Take 20 mg by mouth daily., Disp: , Rfl:       Objective:   Physical Exam  Nursing note and vitals reviewed. Constitutional: He is oriented to person, place, and time. He appears well-developed and well-nourished. No distress.  obese  HENT:  Head: Normocephalic and atraumatic.  Right Ear: External ear normal.  Left Ear: External ear normal.  Mouth/Throat: Oropharynx is clear and moist. No oropharyngeal exudate.  Eyes: Conjunctivae and EOM are normal. Pupils are equal, round, and reactive to light. Right eye exhibits no discharge. Left eye exhibits no discharge. No scleral icterus.  Neck: Normal range of motion. Neck  supple. No JVD present. No tracheal deviation present. No thyromegaly present.  Cardiovascular: Normal rate, regular rhythm and intact distal pulses.  Exam reveals no gallop and no friction rub.   No murmur heard. Pulmonary/Chest: Effort normal and breath sounds normal. No respiratory distress. He has no wheezes. He has no rales. He exhibits no tenderness.  Abdominal: Soft. Bowel sounds are normal. He exhibits no distension and no mass. There is no tenderness. There is no rebound and no guarding.  Musculoskeletal: Normal range of motion. He exhibits no edema and no tenderness.  Lymphadenopathy:    He has no cervical adenopathy.  Neurological: He is alert and oriented to person, place, and time. He has normal reflexes. No cranial nerve deficit. Coordination normal.  Skin: Skin is warm and dry. No rash noted. He is not diaphoretic. No erythema. No pallor.  Psychiatric: He has a normal mood and affect. His behavior is normal. Judgment and thought content normal.    Filed Vitals:   04/13/14 1157  BP: 122/72  Pulse: 77  Height: 5\' 10"  (1.778 m)  Weight: 234 lb (106.142 kg)  SpO2: 97%         Assessment & Plan:  #Shortness of breath  - correlates with low dlco and exertional desaturation; unclear cause for these but ddx is ILD v Emphysema (CXR clear)  - Other Ddx - angina equivalent (reports chest pain), obesity, deconditioning, systolic/diastolic dysfn   Recommendation - continue your duoneb scheduled, flovent daily and o2 at night as before - start portable o2 system - [place]  -please see your cardiologist Dr Domenic Polite 05/03/14 to ensure heart is okay  -Please do  High Resolution CT chest without contrast on ILD protocol to sort out cause of o2 drop; will call with results   - IF CT shows emphysema only - will refer to pulmonary rehab   #Followup  2 months iwht NP Tammy to report progres

## 2014-04-13 NOTE — Progress Notes (Signed)
PFT done today. 

## 2014-04-13 NOTE — Patient Instructions (Addendum)
#  Shortness of breath  - unclear why you are still short of breath despite  discharge from hospital in august 2015 but likely related to O2 drop -- your cxxr and breathing test are ok except some reduction in diffusion capacity - efficiency of  O2 transfer from lung to blood   Recommendation - continue your duoneb scheduled, flovent daily and o2 at night as before - start portable o2 system - [place]  -please see your cardiologist Dr Domenic Polite 05/03/14 to ensure heart is okay  -Please do  High Resolution CT chest without contrast on ILD protocol to sort out cause of o2 drop; will call with results   - IF CT shows emphysema only - will refer to pulmonary rehab   #Followup  2 months iwht NP Tammy to report progress

## 2014-04-13 NOTE — Progress Notes (Signed)
Quick Note:  Pt had f/u visit today with MR. Results of test were verbally given to pt. Nothing further needed. ______

## 2014-04-17 ENCOUNTER — Other Ambulatory Visit: Payer: Medicaid Other

## 2014-04-19 ENCOUNTER — Ambulatory Visit (INDEPENDENT_AMBULATORY_CARE_PROVIDER_SITE_OTHER)
Admission: RE | Admit: 2014-04-19 | Discharge: 2014-04-19 | Disposition: A | Discharge status: Home / Self Care | Payer: Medicaid Other | Source: Ambulatory Visit | Attending: Internal Medicine | Admitting: Internal Medicine

## 2014-04-19 DIAGNOSIS — R0689 Other abnormalities of breathing: Secondary | ICD-10-CM

## 2014-04-19 DIAGNOSIS — R0600 Dyspnea, unspecified: Secondary | ICD-10-CM

## 2014-04-23 ENCOUNTER — Telehealth: Payer: Self-pay | Admitting: Internal Medicine

## 2014-04-23 NOTE — Telephone Encounter (Signed)
[name] (my nurse)   - CT chest for [name] shows early ILD. Please cancel his appt with Tammy Parrett the nP 06/13/14 and move it upto see me earlier but after he sees Dr Halford Chessman 04/30/14. I will order autoimmune profile at followup   Sam (Dr Domenic Polite cardiologist)  - patient has early ILD and with age < 59  I do not know if this is IPF or other typle of ILD (pulmonary fibrosis).  He does  Not know yet but when I see him (after you see him)  I will be discussing with him surgical lung biopsy. So any thoughts on fitness for that procedure would be helpful. We can even discuss this after I see him on followup  Thanks  Dr. Brand Males, M.D., [facility].C.P Pulmonary and Critical Care Medicine Staff Physician [facility] Pager: [phone], If no answer or between  15:00h - 7:00h: call [phone]  04/23/2014 7:47 AM

## 2014-04-25 NOTE — Telephone Encounter (Signed)
lmtcb for pt.  

## 2014-04-25 NOTE — Telephone Encounter (Signed)
Pt returning call.[name] ° °

## 2014-04-26 NOTE — Telephone Encounter (Signed)
[phone] returning a call

## 2014-04-30 ENCOUNTER — Ambulatory Visit (INDEPENDENT_AMBULATORY_CARE_PROVIDER_SITE_OTHER): Payer: Medicaid Other | Admitting: Pulmonary Disease

## 2014-04-30 ENCOUNTER — Encounter: Payer: Self-pay | Admitting: Pulmonary Disease

## 2014-04-30 VITALS — BP 132/86 | HR 82 | Temp 98.4°F | Ht 68.0 in | Wt 238.8 lb

## 2014-04-30 DIAGNOSIS — R0683 Snoring: Secondary | ICD-10-CM

## 2014-04-30 DIAGNOSIS — J9611 Chronic respiratory failure with hypoxia: Secondary | ICD-10-CM

## 2014-04-30 DIAGNOSIS — Z720 Tobacco use: Secondary | ICD-10-CM

## 2014-04-30 NOTE — Assessment & Plan Note (Signed)
He has snoring, sleep disruption, daytime sleepiness, and witnessed apnea.  He has hx of CAD, TIA, DM, HTN, COPD, ILD, and chronic respiratory failure.  I am concerned he could have sleep apnea.  We discussed how sleep apnea can affect various health problems including risks for hypertension, cardiovascular disease, and diabetes.  We also discussed how sleep disruption can increase risks for accident, such as while driving.  Weight loss as a means of improving sleep apnea was also reviewed.  Additional treatment options discussed were CPAP therapy, oral appliance, and surgical intervention.  He will need in lab sleep study to further assess.

## 2014-04-30 NOTE — Assessment & Plan Note (Signed)
Discussed need for smoking cessation.

## 2014-04-30 NOTE — Progress Notes (Signed)
Chief Complaint  Patient presents with  . SLEEP CONSULT    Referred by Dr Chase Caller. Uses O2 4L/min with sleep and with exertion at times. Epworth Score: 16    History of Present Illness: JAECOB LOWDEN is a 58 y.o. male for evaluation of sleep problems.  He is followed by Dr. Chase Caller for emphysema and early ILD.  There was concern about daytime sleepiness and fatigue.  He was advised to have sleep assessment.  He snores, and will stop breathing while asleep.  He wakes up feeling choked, and can't sleep on his back.  He feels tired throughout the day, and falls asleep for 2 hours in the afternoon.  He sleeps better when sitting up in a recliner.  He uses 4 liters oxygen 24/7.  He still smokes cigarettes.  He goes to sleep between 9 and 11 pm.  He falls asleep quickly.  He wakes up a few times to use the bathroom.  He gets out of bed at 6 am.  He feels exhausted in the morning.  He denies morning headache.  He does not use anything to help him fall sleep or stay awake.  He denies sleep walking, sleep talking, bruxism, or nightmares.  There is no history of restless legs.  He denies sleep hallucinations, sleep paralysis, or cataplexy.  The Epworth score is 16 out of 24.  Tests: Echo 02/25/14 >> EF 65 to 70%, mild LVH, grade 1 diastolic dysfx PFT 2/84/13 >> FEV1 3.67 (100%), FEV1% 6.11 (87%), DLCO 55%, no BD   Yona W Faria  has a past medical history of Essential hypertension, benign; TIA (transient ischemic attack) (1997); Noncompliance; COPD (chronic obstructive pulmonary disease); Hyperlipidemia; History of pneumonia; Type 2 diabetes mellitus (2011); GERD (gastroesophageal reflux disease); Substance abuse; Cocaine abuse; Renal mass, right; Cholecystitis, acute (12/20/2013); Coronary atherosclerosis of native coronary artery; Anginal pain; Shortness of breath; Depression; Anxiety; History of kidney stones; Arthritis; Cancer; and Hepatitis (Late 1970s).  KENDARIOUS GUDINO  has past surgical  history that includes Appendectomy (1970's); Cholecystectomy (N/A, 12/22/2013); Coronary stent placement; Cardiac catheterization; Robot assisted laparoscopic nephrectomy (Right, 02/21/2014); and Laparoscopic lysis of adhesions (02/21/2014).  Prior to Admission medications   Medication Sig Start Date End Date Taking? Authorizing Provider  albuterol (PROVENTIL HFA;VENTOLIN HFA) 108 (90 BASE) MCG/ACT inhaler Inhale 2 puffs into the lungs every 6 (six) hours as needed for wheezing or shortness of breath.   Yes Historical Provider, MD  albuterol (PROVENTIL) (2.5 MG/3ML) 0.083% nebulizer solution Take 3 mLs (2.5 mg total) by nebulization every 3 (three) hours as needed for wheezing or shortness of breath. 02/27/14  Yes Nita Sells, MD  alfuzosin (UROXATRAL) 10 MG 24 hr tablet Take 10 mg by mouth daily with breakfast.   Yes Historical Provider, MD  atorvastatin (LIPITOR) 20 MG tablet Take 20 mg by mouth daily.   Yes Historical Provider, MD  citalopram (CELEXA) 40 MG tablet Take 40 mg by mouth every morning.   Yes Historical Provider, MD  diltiazem (CARDIZEM CD) 240 MG 24 hr capsule Take 240 mg by mouth every morning.   Yes Historical Provider, MD  FLUoxetine (PROZAC) 10 MG capsule Take 10 mg by mouth every morning.   Yes Historical Provider, MD  fluticasone (FLOVENT HFA) 220 MCG/ACT inhaler Inhale 1 puff into the lungs 2 (two) times daily. 02/27/14  Yes Nita Sells, MD  furosemide (LASIX) 40 MG tablet Take 1 tablet (40 mg total) by mouth daily. 02/27/14  Yes Nita Sells, MD  gabapentin (NEURONTIN)  300 MG capsule Take 300 mg by mouth at bedtime.   Yes Historical Provider, MD  glipiZIDE (GLUCOTROL) 10 MG tablet Take 10 mg by mouth 2 (two) times daily.   Yes Historical Provider, MD  HYDROcodone-acetaminophen (NORCO) 5-325 MG per tablet Take 1-2 tablets by mouth every 6 (six) hours as needed. 02/27/14  Yes Nita Sells, MD  ipratropium-albuterol (DUONEB) 0.5-2.5 (3) MG/3ML SOLN Take 3 mLs  by nebulization every 6 (six) hours as needed (Shortness of breath).   Yes Historical Provider, MD  isosorbide mononitrate (IMDUR) 60 MG 24 hr tablet Take 60 mg by mouth every morning.    Yes Historical Provider, MD  nitroGLYCERIN (NITROSTAT) 0.4 MG SL tablet Place 1 tablet (0.4 mg total) under the tongue every 5 (five) minutes as needed for chest pain. 06/03/12  Yes Ivor Costa, MD  ranitidine (ZANTAC) 300 MG tablet Take 300 mg by mouth every morning.    Yes Historical Provider, MD  sitaGLIPtin (JANUVIA) 100 MG tablet Take 100 mg by mouth every morning.    Yes Historical Provider, MD  solifenacin (VESICARE) 5 MG tablet Take 5 mg by mouth daily.   Yes Historical Provider, MD    No Known Allergies  His family history includes CAD in his brother and father; Cancer in his maternal grandfather, maternal grandmother, and mother; Diabetes in an other family member; Hypertension in an other family member; Lung cancer in his father; Stroke in an other family member.  He  reports that he has been smoking Cigarettes.  He has a 20.5 pack-year smoking history. He has never used smokeless tobacco. He reports that he does not drink alcohol or use illicit drugs.  Review of Systems  Constitutional: Positive for unexpected weight change. Negative for fever.  HENT: Positive for sneezing. Negative for congestion, dental problem, ear pain, nosebleeds, postnasal drip, rhinorrhea, sinus pressure, sore throat and trouble swallowing.   Eyes: Negative for redness and itching.  Respiratory: Positive for cough and shortness of breath. Negative for chest tightness and wheezing.   Cardiovascular: Positive for chest pain. Negative for palpitations and leg swelling.  Gastrointestinal: Negative for nausea and vomiting.       Acid heartburn  Genitourinary: Negative for dysuria.  Musculoskeletal: Negative for joint swelling.  Skin: Negative for rash.  Neurological: Negative for headaches.  Hematological: Does not bruise/bleed  easily.  Psychiatric/Behavioral: Positive for dysphoric mood. The patient is nervous/anxious.    Physical Exam:  General - No distress ENT - No sinus tenderness, no oral exudate, no LAN, no thyromegaly, TM clear, pupils equal/reactive, wears dentures, MP 3 Cardiac - s1s2 regular, no murmur, pulses symmetric Chest - decreased breath sounds, no wheeze Back - No focal tenderness Abd - Soft, non-tender, no organomegaly, + bowel sounds Ext - No edema Neuro - Normal strength, cranial nerves intact Skin - No rashes Psych - Normal mood, and behavior  Assessment/plan:  Chesley Mires, M.D. Pager [phone]

## 2014-04-30 NOTE — Patient Instructions (Signed)
Will arrange for sleep study Will call to arrange for follow up after sleep study reviewed 

## 2014-04-30 NOTE — Progress Notes (Deleted)
   Subjective:    Patient ID: [name], male    DOB: [birth year], 58 y.o.   MRN: [id number]  HPI    Review of Systems  Constitutional: Positive for unexpected weight change. Negative for fever.  HENT: Positive for sneezing. Negative for congestion, dental problem, ear pain, nosebleeds, postnasal drip, rhinorrhea, sinus pressure, sore throat and trouble swallowing.   Eyes: Negative for redness and itching.  Respiratory: Positive for cough and shortness of breath. Negative for chest tightness and wheezing.   Cardiovascular: Positive for chest pain. Negative for palpitations and leg swelling.  Gastrointestinal: Negative for nausea and vomiting.       Acid heartburn  Genitourinary: Negative for dysuria.  Musculoskeletal: Negative for joint swelling.  Skin: Negative for rash.  Neurological: Negative for headaches.  Hematological: Does not bruise/bleed easily.  Psychiatric/Behavioral: Positive for dysphoric mood. The patient is nervous/anxious.        Objective:   Physical Exam        Assessment & Plan:

## 2014-04-30 NOTE — Assessment & Plan Note (Addendum)
Related to emphysema and ILD >> he will f/u with Dr. Chase Caller.  He is to continue 4 liters oxygen 24/7.  Will re-assess his nocturnal oxygen needs after his sleep study is reviewed.

## 2014-05-01 NOTE — Telephone Encounter (Signed)
Called and spoke to pt. Informed pt of the results per MR. Appt changed from TP to MR on 10/16. Pt verbalized understanding and denied any further questions or concerns at this time.

## 2014-05-03 ENCOUNTER — Ambulatory Visit (INDEPENDENT_AMBULATORY_CARE_PROVIDER_SITE_OTHER): Payer: Medicaid Other | Admitting: Cardiology

## 2014-05-03 ENCOUNTER — Encounter: Payer: Self-pay | Admitting: Cardiology

## 2014-05-03 VITALS — BP 110/68 | HR 69 | Ht 69.0 in | Wt 231.8 lb

## 2014-05-03 DIAGNOSIS — D49519 Neoplasm of unspecified behavior of unspecified kidney: Secondary | ICD-10-CM

## 2014-05-03 DIAGNOSIS — F1910 Other psychoactive substance abuse, uncomplicated: Secondary | ICD-10-CM

## 2014-05-03 DIAGNOSIS — Z01810 Encounter for preprocedural cardiovascular examination: Secondary | ICD-10-CM

## 2014-05-03 DIAGNOSIS — Z720 Tobacco use: Secondary | ICD-10-CM

## 2014-05-03 DIAGNOSIS — I25119 Atherosclerotic heart disease of native coronary artery with unspecified angina pectoris: Secondary | ICD-10-CM

## 2014-05-03 DIAGNOSIS — D495 Neoplasm of unspecified behavior of other genitourinary organs: Secondary | ICD-10-CM

## 2014-05-03 NOTE — Assessment & Plan Note (Signed)
Continue medical therapy, currently on aspirin, Cardizem CD, Lipitor, Imdur, and Lasix. Cardiac catheterization from June of this year noted above with patent LAD stent site and otherwise mild to moderate nonobstructive disease. LVEF 65-70% with grade 1 diastolic dysfunction.

## 2014-05-03 NOTE — Assessment & Plan Note (Signed)
Reviewed recent notes by Dr. Chase Caller. Patient may be in need of surgical lung biopsy for further evaluation of ILD. From a cardiac perspective, no contraindication to proceed with this. He had patent LAD stent site with mild to moderate residual circumflex and RCA disease at recent heart catheterization in June. Does report intermittent angina symptoms, although this has been stable, and there were no clear revascularization strategies to consider. Would continue medical therapy, no further cardiac testing at this time.

## 2014-05-03 NOTE — Progress Notes (Signed)
Clinical Summary [name] is a medically complex 58 y.o.male last seen in the office in July. He underwent radical nephrectomy in August for diagnosis of right renal cell carcinoma. Hospitalization was complicated by encephalopathy as well as hypoxic respiratory failure. He was treated with Lasix, also management for possible COPD component. Followup echocardiogram showed no significant change with mild LVH, LVEF 97-98%, grade 1 diastolic dysfunction. There was no evidence of ACS by cardiac enzymes.  He has had subsequent followup in the Pulmonary clinic, sees Dr. Chase Caller. He has evidence of early interstitial lung disease and may acquire further evaluation including possible surgical lung biopsy.  Followup cardiac catheterization performed in June of this year demonstrated patent stent site within the mid LAD with otherwise mild to moderate nonobstructive residual disease in the circumflex and RCA distribution, LVEF 65%. He reports compliance with his cardiac medications, states that he has not required sublingual nitroglycerin in the last month.  Lab work in August of potassium 4.6, BUN 39, creatinine 1.5.   No Known Allergies  Current Outpatient Prescriptions  Medication Sig Dispense Refill  . albuterol (PROVENTIL HFA;VENTOLIN HFA) 108 (90 BASE) MCG/ACT inhaler Inhale 2 puffs into the lungs every 6 (six) hours as needed for wheezing or shortness of breath.      [name] albuterol (PROVENTIL) (2.5 MG/3ML) 0.083% nebulizer solution Take 3 mLs (2.5 mg total) by nebulization every 3 (three) hours as needed for wheezing or shortness of breath.  75 mL  12  . alfuzosin (UROXATRAL) 10 MG 24 hr tablet Take 10 mg by mouth daily with breakfast.      . atorvastatin (LIPITOR) 20 MG tablet Take 20 mg by mouth daily.      . citalopram (CELEXA) 40 MG tablet Take 40 mg by mouth every morning.      . diltiazem (CARDIZEM CD) 240 MG 24 hr capsule Take 240 mg by mouth every morning.      [name] FLUoxetine (PROZAC)  10 MG capsule Take 10 mg by mouth every morning.      . fluticasone (FLOVENT HFA) 220 MCG/ACT inhaler Inhale 1 puff into the lungs 2 (two) times daily.  1 Inhaler  12  . furosemide (LASIX) 40 MG tablet Take 1 tablet (40 mg total) by mouth daily.  30 tablet  0  . gabapentin (NEURONTIN) 300 MG capsule Take 300 mg by mouth at bedtime.      [name] glipiZIDE (GLUCOTROL) 10 MG tablet Take 10 mg by mouth 2 (two) times daily.      [name] ipratropium-albuterol (DUONEB) 0.5-2.5 (3) MG/3ML SOLN Take 3 mLs by nebulization every 6 (six) hours as needed (Shortness of breath).      . isosorbide mononitrate (IMDUR) 60 MG 24 hr tablet Take 60 mg by mouth every morning.       . nitroGLYCERIN (NITROSTAT) 0.4 MG SL tablet Place 1 tablet (0.4 mg total) under the tongue every 5 (five) minutes as needed for chest pain.  30 tablet  1  . ranitidine (ZANTAC) 300 MG tablet Take 300 mg by mouth every morning.       . sitaGLIPtin (JANUVIA) 100 MG tablet Take 100 mg by mouth every morning.       . solifenacin (VESICARE) 5 MG tablet Take 5 mg by mouth daily.       No current facility-administered medications for this visit.    Past Medical History  Diagnosis Date  . Essential hypertension, benign   . TIA (transient ischemic attack) 1997  No residual neurological deficits.  . Noncompliance   . COPD (chronic obstructive pulmonary disease)   . Hyperlipidemia   . History of pneumonia   . Type 2 diabetes mellitus 2011  . GERD (gastroesophageal reflux disease)   . Substance abuse   . Cocaine abuse     02-14-14 dicusses freely with medical team, prefers no discussions with family present.  . Renal cell carcinoma     Status post radical nephrectomy August 2015  . Cholecystitis, acute 12/20/2013    Lap chole 6/5  . Coronary atherosclerosis of native coronary artery     a. 03/09/2013 Cath/PCI: LM nl, LAD: 50p, 67m (2.5x16 promus DES), LCX nl, OM1 min irregs, LPL/LPDA diff dzs, RCA nondom, mod diff dzs, EF 55%.  . Depression   .  Anxiety   . History of nephrolithiasis   . Arthritis   . Hepatitis Late 1970s    Past Surgical History  Procedure Laterality Date  . Appendectomy  1970's  . Cholecystectomy N/A 12/22/2013    Procedure: LAPAROSCOPIC CHOLECYSTECTOMY ;  Surgeon: [name], MD;  Location: [facility];  Service: General;  Laterality: N/A;  . Robot assisted laparoscopic nephrectomy Right 02/21/2014    Procedure: ROBOTIC ASSISTED LAPAROSCOPIC RIGHT NEPHRECTOMY ;  Surgeon: [name], MD;  Location: [facility];  Service: Urology;  Laterality: Right;  . Laparoscopic lysis of adhesions  02/21/2014    Procedure: LAPAROSCOPIC LYSIS OF ADHESIONS EXTINSIVE;  Surgeon: [name], MD;  Location: [facility];  Service: Urology;;    Social History [name] reports that he has been smoking Cigarettes.  He started smoking about 42 years ago. He has a 20.5 pack-year smoking history. He has never used smokeless tobacco. [name] reports that he does not drink alcohol.  Review of Systems Uses oxygen most of the day. Chronic shortness of breath, no recent cough or hemoptysis. No fevers or chills. Stable appetite. Other systems reviewed and negative.  Physical Examination Filed Vitals:   05/03/14 1133  BP: 110/68  Pulse: 69   Filed Weights   05/03/14 1128  Weight: 231 lb 12 oz (105.121 kg)    Overweight..Appears comfortable at rest. Oxygen via nasal cannula. HEENT: Conjunctiva and lids normal, oropharynx clear.  Neck: Supple, no elevated JVP or carotid bruits, no thyromegaly.  Lungs: Diminished but clear to auscultation, nonlabored breathing at rest.  Cardiac: Regular rate and rhythm, no S3, 2/6 systolic murmur, no pericardial rub.  Abdomen: Soft, nontender, bowel sounds present, no guarding or rebound.  Extremities: No pitting edema, distal pulses 2+.  Skin: Warm and dry.  Musculoskeletal: No kyphosis.  Neuropsychiatric: Alert and oriented x3, affect grossly appropriate.   Problem List and Plan   Coronary  atherosclerosis of native coronary artery Continue medical therapy, currently on aspirin, Cardizem CD, Lipitor, Imdur, and Lasix. Cardiac catheterization from June of this year noted above with patent LAD stent site and otherwise mild to moderate nonobstructive disease. LVEF 65-70% with grade 1 diastolic dysfunction.  Preoperative cardiovascular examination Reviewed recent notes by Dr. Chase Caller. Patient may be in need of surgical lung biopsy for further evaluation of ILD. From a cardiac perspective, no contraindication to proceed with this. He had patent LAD stent site with mild to moderate residual circumflex and RCA disease at recent heart catheterization in June. Does report intermittent angina symptoms, although this has been stable, and there were no clear revascularization strategies to consider. Would continue medical therapy, no further cardiac testing at this time.  Tobacco abuse Continues to smoke  cigarettes. Smoking cessation has been discussed.  Substance abuse History of intermittent cocaine use. Patient denies recent use.  Renal neoplasm Right renal cell carcinoma status post radical nephrectomy in August.    Satira Sark, M.D., F.A.C.C.

## 2014-05-03 NOTE — Assessment & Plan Note (Signed)
History of intermittent cocaine use. Patient denies recent use.

## 2014-05-03 NOTE — Patient Instructions (Signed)
Your physician recommends that you schedule a follow-up appointment in: 3 months. You will receive a reminder letter in the mail in about 1-2 months reminding you to call and schedule your appointment. If you don't receive this letter, please contact our office. Your physician recommends that you continue on your current medications as directed. Please refer to the Current Medication list given to you today. 

## 2014-05-03 NOTE — Assessment & Plan Note (Signed)
Right renal cell carcinoma status post radical nephrectomy in August.

## 2014-05-03 NOTE — Assessment & Plan Note (Signed)
Continues to smoke cigarettes. Smoking cessation has been discussed.

## 2014-05-04 ENCOUNTER — Ambulatory Visit (INDEPENDENT_AMBULATORY_CARE_PROVIDER_SITE_OTHER): Payer: Medicaid Other | Admitting: Internal Medicine

## 2014-05-04 ENCOUNTER — Other Ambulatory Visit (INDEPENDENT_AMBULATORY_CARE_PROVIDER_SITE_OTHER): Payer: Medicaid Other

## 2014-05-04 ENCOUNTER — Encounter: Payer: Self-pay | Admitting: Internal Medicine

## 2014-05-04 VITALS — BP 122/82 | HR 75 | Ht 69.0 in | Wt 236.4 lb

## 2014-05-04 DIAGNOSIS — J849 Interstitial pulmonary disease, unspecified: Secondary | ICD-10-CM

## 2014-05-04 LAB — SEDIMENTATION RATE: Sed Rate: 31 mm/hr — ABNORMAL HIGH (ref 0–22)

## 2014-05-04 NOTE — Addendum Note (Signed)
Addended by: Brand Males on: 05/04/2014 09:44 AM   Modules accepted: Level of Service

## 2014-05-04 NOTE — Progress Notes (Addendum)
Subjective:    Patient ID: [name], male    DOB: 02-Oct-1955, 58 y.o.   MRN: [id number]  HPI   OV  03/21/2014  Chief Complaint  Patient presents with  . Follow-up    HFU- Pt was d/c from [facility] for acute respiratory failure.    58 year old male recently hospitalized for nephrectomy for renal cell carcinoma and in the postoperative phase had acute respiratory failure with possible postoperative pneumonia. He was seen by inpatient pulmonary. He was discharged approximately 3 weeks ago. Since then he says he is feeling better. He continues to wear oxygen. Preadmission he was not wearing oxygen. He uses Flovent inhaler. I do not see any other inhaler with him. However he has all his medications in a bag and he seems to be compliant with it. He notes that his respiratory status is stable. He denies prior existence of COPD. Currently denies any chest pain.  Most of the symptoms this visit focused on the fact that he has chronic excessive daytime somnolence. Unclear if he has a baseline history of snoring because he lives alone. He also complains of chronic urinary problems after his surgery. Denies any cough   reports that he has been smoking Cigarettes.  He has a 20.5 pack-year smoking history. He has never used smokeless tobacco.  REC Glad you are better from breathing stand point Refer sleep doctor to ensure sleep apnea ruled out Do CXR 2 view today  03/21/2014; will call with results Do full PFT breathing test next few weeks Return to see my NP Tammy after that for med calendar and followup visit Til then continue your inhalers  OV 04/13/2014  Chief Complaint  Patient presents with  . Follow-up    Pt states his breathing is unchanged since last OV. Pt states he is wearing nocturnal O2 at 4lpm and it is helping. Pt c/o DOE, dry cough. Pt denies CP/tightness.    FU post hospital dyspnea   - Poor historian. Says better but then also reports class 2-3 dyspnea on exertion relieved by  rest. NEbs and flovent and night o2 not helping. There is ass CAD and he does have exertional chest pain; has appt due with cards mid-oct 2015. Walking deat: 2nd lap of 185 feet - dyspneic, and desaturated 04/13/2014. Denies active wheeze or cough or hemoptysis or edema or fever or chills. PFT 04/13/2014 review and only has isolated low DLCO 55%.     Chief Complaint  Patient presents with  . Follow-up    review CT results. Pt denies any increase in SOB at this time.     Here to review results of dyspnea workup. CT chest 04/19/14 as below   - CT chest or [name] shows   - early ILD. NSIP V Possibe UIP pattern of ILD   -  In addition, there are patchy  areas of peripheral and peribronchovascular ground-glass attenuation which are very mild, with some scattered areas of very mild  subpleural reticulation. No traction bronchiectasis or frank  honeycombing noted at this time. Inspiratory and expiratory images  demonstrate minimal air trapping in the lungs bilaterally. No suspicious appearing pulmonary nodules or masses. No acute  consolidative airspace disease.  - calcified coronaries +  - normal esophgus +  -  There are subtle noncalcified pleural plaques  throughout the thorax bilaterally. No definite calcified pleural  plaques are identified at this time.   -  mild centrilobular and paraseptal emphysema, most  evident the lung  apices bilaterally.     He has seen cardiology: they have cleared him for surgical lung biopsy if he needs one ; see Dr Domenic Polite note 05/03/14. He has no new complaints. Dyspnea still +. Using o2 and it helps. Sleep apnea eval pending   Exposure hx and ILD relevant hx   - dyspnea for 1 yearfor 100 yards and dry cough x 3 months  - Smoking:   reports that he has been smoking Cigarettes.  He started smoking about 42 years ago. He has a 20.5 pack-year smoking history. He has never used smokeless tobacco.  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 Denies farm work, Curator, Educational psychologist, Psychiatric nurse, Pension scheme manager, Building control surveyor, Forensic psychologist, Personal assistant, Engineer, maintenance (IT), mine, [place], [employer], Teacher, adult education, Customer service manager, foundry, rail road, paper pill, smelting, tunnel work   - Fam hx : posive for copd, pulmonary fibrosis (unclear who) and asthma  - Home xpo: deneies sauran, [place], hot tub, birds  - Autoimmune - none so far  has a past medical history of Essential hypertension, benign; TIA (transient ischemic attack) (1997); Noncompliance; COPD (chronic obstructive pulmonary disease); Hyperlipidemia; History of pneumonia; Type 2 diabetes mellitus (2011); GERD (gastroesophageal reflux disease); Substance abuse; Cocaine abuse; Renal cell carcinoma; Cholecystitis, acute (12/20/2013); Coronary atherosclerosis of native coronary artery; Depression; Anxiety; History of nephrolithiasis; Arthritis; and Hepatitis (Late 1970s).     Review of Systems  Constitutional: Negative for fever and unexpected weight change.  HENT: Negative for congestion, dental problem, ear pain, nosebleeds, postnasal drip, rhinorrhea, sinus pressure, sneezing, sore throat and trouble swallowing.   Eyes: Negative for redness and itching.  Respiratory: Positive for shortness of breath. Negative for cough, chest tightness and wheezing.   Cardiovascular: Negative for palpitations and leg swelling.  Gastrointestinal: Negative for nausea and vomiting.  Genitourinary: Negative for dysuria.  Musculoskeletal: Negative for joint swelling.  Skin: Negative for rash.  Neurological: Negative for headaches.  Hematological: Does not bruise/bleed easily.  Psychiatric/Behavioral: Negative for dysphoric mood. The patient is not nervous/anxious.        Objective:   Physical Exam  Filed Vitals:   05/04/14 0916  BP: 122/82  Pulse: 75  Height: $Remove'5\' 9"'oepaKSF$  (1.753 m)  Weight: 236 lb 6.4 oz (107.23 kg)  SpO2: 97%     Focussed exam  - Clubbing +  - Obesity +  - Could not hear basal crackles+     Assessment & Plan:  #Shortness of breath #ILD   -  - I am concerned you might have Interstitial Lung Disease (ILD)  -  There are > 100 varieties of this - To narrow down possibilities and assess severity please do the following tests  -do autoimmune panel: Serum:  ESR, ANA, DS-DNA, RF, anti-CCP, ssA, ssB, scl-70, ANCA, MPO and PR-3 antibodies, Total CK,  Aldolase,  Hypersensitivity Pneumonitis Panel - Probability for IPF is intermediate - high: he is a male, smoker, with clubbing, fam hx of fibrosis,  and with emphysema on CT and clubbing with possible radiological UIP: all this favors IPF. However, he is on youger side for IPF. In aaddition, smoking related ILDs like RB-ILD are possible as weell   PLAN  - If autoimmune blood  results are not helpful finding a cause, I  refer him  to surgeon for surgical lung biopsy based on ATS 2011 guideliness for scans that are not definitive of IPF   >  50% of this > 25 min visit spent in face to face counseling (15 min visit converted to 25 min)     Dr. Brand Males, M.D., [facility].C.P Pulmonary and Critical Care Medicine Staff Physician [place] [facility] Pager: [phone], If no answer or between  15:00h - 7:00h: call [phone]  05/04/2014 9:39 AM

## 2014-05-04 NOTE — Patient Instructions (Addendum)
#  Shortness of breath #ILD   -  - I am concerned you might have Interstitial Lung Disease (ILD)  -  There are > 100 varieties of this - To narrow down possibilities and assess severity please do the following tests  -do autoimmune panel: Serum:  ESR, ANA, DS-DNA, RF, anti-CCP, ssA, ssB, scl-70, ANCA, MPO and PR-3 antibodies, Total CK,  Aldolase,  Hypersensitivity Pneumonitis Panel  - I will call you with blood test results. If results are not helpful finding a cause, I will refer you to surgeon for surgical lung biopsy

## 2014-05-05 LAB — CK TOTAL AND CKMB (NOT AT ARMC)
CK TOTAL: 102 U/L (ref 7–232)
CK, MB: 1.2 ng/mL (ref 0.0–5.0)
Relative Index: 1.2 (ref 0.0–4.0)

## 2014-05-07 LAB — CYCLIC CITRUL PEPTIDE ANTIBODY, IGG: Cyclic Citrullin Peptide Ab: <2 U/mL (ref 0.0–5.0)

## 2014-05-07 LAB — MPO/PR-3 (ANCA) ANTIBODIES: Serine Protease 3: <1

## 2014-05-07 LAB — SJOGREN'S SYNDROME ANTIBODS(SSA + SSB)
SSA (Ro) (ENA) Antibody, IgG: <1
SSB (La) (ENA) Antibody, IgG: <1

## 2014-05-07 LAB — ANCA SCREEN W REFLEX TITER
ATYPICAL P-ANCA SCREEN: NEGATIVE
c-ANCA Screen: NEGATIVE
p-ANCA Screen: NEGATIVE

## 2014-05-07 LAB — ANA: ANA: NEGATIVE

## 2014-05-07 LAB — ANTI-DNA ANTIBODY, DOUBLE-STRANDED

## 2014-05-07 LAB — ANTI-SCLERODERMA ANTIBODY: Scleroderma (Scl-70) (ENA) Antibody, IgG: <1

## 2014-05-08 ENCOUNTER — Telehealth: Payer: Self-pay | Admitting: Internal Medicine

## 2014-05-08 DIAGNOSIS — J849 Interstitial pulmonary disease, unspecified: Secondary | ICD-10-CM

## 2014-05-08 LAB — ALDOLASE: Aldolase: 5 U/L (ref <=8.1)

## 2014-05-08 NOTE — Telephone Encounter (Signed)
Elise  Autoimmune lab panel is NEGATIVE. So let him know we do not know cause of ILD. Please refer him to Dr Roxan Hockey for surgical lung biopsy evaluation (patient cleared by cards for the bx). Let me know date of seeing Hardesty. If Dr Roxan Hockey not available, patient can see Dr Servando Snare as well. But please keep me posted  Thanks  Dr. Brand Males, M.D., [facility].C.P Pulmonary and Critical Care Medicine Staff Physician [place] [facility] Pager: [phone], If no answer or between  15:00h - 7:00h: call [phone]  05/08/2014 12:33 AM

## 2014-05-08 NOTE — Telephone Encounter (Signed)
lmtcb for pt.  Referral order placed.

## 2014-05-11 NOTE — Telephone Encounter (Signed)
Pt returning call currently @ pc dr office and is going to cut phone off and will turn it back on once he finishes.[name]

## 2014-05-12 LAB — HYPERSENSITIVITY PNUEMONITIS PROFILE

## 2014-05-15 ENCOUNTER — Other Ambulatory Visit: Payer: Self-pay | Admitting: *Deleted

## 2014-05-15 ENCOUNTER — Encounter: Payer: Self-pay | Admitting: Thoracic Surgery (Cardiothoracic Vascular Surgery)

## 2014-05-15 ENCOUNTER — Institutional Professional Consult (permissible substitution) (INDEPENDENT_AMBULATORY_CARE_PROVIDER_SITE_OTHER): Payer: Medicaid Other | Admitting: Thoracic Surgery (Cardiothoracic Vascular Surgery)

## 2014-05-15 VITALS — BP 120/77 | HR 97 | Ht 69.0 in | Wt 236.0 lb

## 2014-05-15 DIAGNOSIS — J849 Interstitial pulmonary disease, unspecified: Secondary | ICD-10-CM

## 2014-05-15 NOTE — Progress Notes (Signed)
PCP is Kevin, [name], MD Referring Provider is Brand Males, MD  Chief Complaint  Patient presents with  . NEW THORACIC    EVAL FOR LUNG BIOPSY    HPI: A 58 year old man sent for consultation regarding a lung biopsy for suspected interstitial lung disease.  [name] is a 58 year old man with a common Medical history. His past history includes coronary artery disease with a stent to his mid LAD in August 2014 he was treated with Plavix for that until August of this year. It has since been discontinued. He also has a history of polysubstance abuse including tobacco and cocaine, medical noncompliance, poorly controlled type 2 diabetes, congestive heart failure, hypertension, hyperlipidemia, and likely obstructive sleep apnea.  In June he had a cholecystectomy. He had a laparoscopic right nephrectomy for renal cell cancer in August. His postoperative course was complicated by hypoxic respiratory failure and delirium. He was discharged on home oxygen and remains on oxygen since that time. He currently is using 4 L nasal cannula.  Complains of shortness of breath with exertion. He continues to smoke 0.5 packs of cigarettes daily as he has for the past 40 years. He is not interested in quitting. He has not had any anginal type chest pain. He had a catheterization this summer which showed his stent in the LAD was patent and there was no hemodynamically significant stenoses in the circumflex or right coronary. He does complain of reflux and heartburn. He also suffers from anxiety and depression.  He recently had a CT which suggested mild early interstitial lung disease.   Past Medical History  Diagnosis Date  . Essential hypertension, benign   . TIA (transient ischemic attack) 1997    No residual neurological deficits.  . Noncompliance   . COPD (chronic obstructive pulmonary disease)   . Hyperlipidemia   . History of pneumonia   . Type 2 diabetes mellitus 2011  . GERD (gastroesophageal  reflux disease)   . Substance abuse   . Cocaine abuse     02-14-14 dicusses freely with medical team, prefers no discussions with family present.  . Renal cell carcinoma     Status post radical nephrectomy August 2015  . Cholecystitis, acute 12/20/2013    Lap chole 6/5  . Coronary atherosclerosis of native coronary artery     a. 03/09/2013 Cath/PCI: LM nl, LAD: 50p, 99m (2.5x16 promus DES), LCX nl, OM1 min irregs, LPL/LPDA diff dzs, RCA nondom, mod diff dzs, EF 55%.  . Depression   . Anxiety   . History of nephrolithiasis   . Arthritis   . Hepatitis Late 1970s    Past Surgical History  Procedure Laterality Date  . Appendectomy  1970's  . Cholecystectomy N/A 12/22/2013    Procedure: LAPAROSCOPIC CHOLECYSTECTOMY ;  Surgeon: [name], MD;  Location: [facility];  Service: General;  Laterality: N/A;  . Robot assisted laparoscopic nephrectomy Right 02/21/2014    Procedure: ROBOTIC ASSISTED LAPAROSCOPIC RIGHT NEPHRECTOMY ;  Surgeon: [name], MD;  Location: [facility];  Service: Urology;  Laterality: Right;  . Laparoscopic lysis of adhesions  02/21/2014    Procedure: LAPAROSCOPIC LYSIS OF ADHESIONS EXTINSIVE;  Surgeon: [name], MD;  Location: [facility];  Service: Urology;;    Family History  Problem Relation Age of Onset  . Hypertension    . Diabetes    . Stroke    . Lung cancer Father   . Cancer Mother     Thyroid - living in her 33's.  [name]  Cancer Maternal Grandmother     Breast  . Cancer Maternal Grandfather     Throat and stomach  . CAD Father   . CAD Brother     Social History History  Substance Use Topics  . Smoking status: Current Every Day Smoker -- 0.50 packs/day for 41 years    Types: Cigarettes    Start date: 05/03/1972  . Smokeless tobacco: Never Used  . Alcohol Use: No    Current Outpatient Prescriptions  Medication Sig Dispense Refill  . alfuzosin (UROXATRAL) 10 MG 24 hr tablet Take 10 mg by mouth daily with breakfast.      . atorvastatin (LIPITOR) 20 MG  tablet Take 20 mg by mouth daily.      . citalopram (CELEXA) 40 MG tablet Take 40 mg by mouth every morning.      . diltiazem (CARDIZEM CD) 240 MG 24 hr capsule Take 240 mg by mouth every morning.      [name] FLUoxetine (PROZAC) 10 MG capsule Take 10 mg by mouth every morning.      . fluticasone (FLOVENT HFA) 220 MCG/ACT inhaler Inhale 1 puff into the lungs 2 (two) times daily.  1 Inhaler  12  . furosemide (LASIX) 40 MG tablet Take 1 tablet (40 mg total) by mouth daily.  30 tablet  0  . gabapentin (NEURONTIN) 300 MG capsule Take 300 mg by mouth at bedtime.      [name] glipiZIDE (GLUCOTROL) 10 MG tablet Take 10 mg by mouth 2 (two) times daily.      [name] ipratropium-albuterol (DUONEB) 0.5-2.5 (3) MG/3ML SOLN Take 3 mLs by nebulization every 6 (six) hours as needed (Shortness of breath).      . isosorbide mononitrate (IMDUR) 60 MG 24 hr tablet Take 60 mg by mouth every morning.       . ranitidine (ZANTAC) 300 MG tablet Take 300 mg by mouth every morning.       . solifenacin (VESICARE) 5 MG tablet Take 5 mg by mouth daily.      [name] albuterol (PROVENTIL HFA;VENTOLIN HFA) 108 (90 BASE) MCG/ACT inhaler Inhale 2 puffs into the lungs every 6 (six) hours as needed for wheezing or shortness of breath.      [name] albuterol (PROVENTIL) (2.5 MG/3ML) 0.083% nebulizer solution Take 3 mLs (2.5 mg total) by nebulization every 3 (three) hours as needed for wheezing or shortness of breath.  75 mL  12  . nitroGLYCERIN (NITROSTAT) 0.4 MG SL tablet Place 1 tablet (0.4 mg total) under the tongue every 5 (five) minutes as needed for chest pain.  30 tablet  1   No current facility-administered medications for this visit.    No Known Allergies  Review of Systems  Constitutional: Positive for fatigue and unexpected weight change (weight loss- 13 pounds due to 2 recent surgeries). Negative for fever and chills.  Respiratory: Positive for apnea, cough, chest tightness, shortness of breath and wheezing.        4L [place] home O2  Cardiovascular:  Positive for leg swelling. Negative for chest pain.       Orthopnea  Gastrointestinal:       Reflux, heartburn  Genitourinary: Positive for frequency.       BPH  Musculoskeletal: Positive for arthralgias.  Neurological: Positive for dizziness.  Hematological: Negative for adenopathy. Bruises/bleeds easily.  Psychiatric/Behavioral: Positive for dysphoric mood. The patient is nervous/anxious.        Delirium postop  All other systems reviewed and are negative.   BP  120/77  Pulse 97  Ht 5\' 9"  (1.753 m)  Wt 236 lb (107.049 kg)  BMI 34.84 kg/m2  SpO2 94% Physical Exam  Vitals reviewed. Constitutional: He is oriented to person, place, and time. No distress.  overweight  HENT:  Head: Normocephalic and atraumatic.  Eyes: EOM are normal. Pupils are equal, round, and reactive to light.  Neck: Neck supple. No JVD present. No thyromegaly present.  Cardiovascular: Normal rate, regular rhythm and normal heart sounds.   No murmur heard. Pulmonary/Chest: Effort normal and breath sounds normal. He has no wheezes. He has no rales.  Abdominal: Soft. There is no tenderness.  Musculoskeletal: He exhibits no edema.  Lymphadenopathy:    He has no cervical adenopathy.  Neurological: He is alert and oriented to person, place, and time. No cranial nerve deficit.  No focal motor deficit  Skin: Skin is warm and dry.     Diagnostic Tests:  CT CHEST WITHOUT CONTRAST  TECHNIQUE:  Multidetector CT imaging of the chest was performed following the  standard protocol without intravenous contrast. High resolution  imaging of the lungs, as well as inspiratory and expiratory imaging,  was performed.  COMPARISON: No priors.  FINDINGS:  Mediastinum: Heart size is normal. There is no significant  pericardial fluid, thickening or pericardial calcification. There is  atherosclerosis of the thoracic aorta, the great vessels of the  mediastinum and the coronary arteries, including calcified   atherosclerotic plaque in the left main, left anterior descending,  left circumflex and right coronary arteries. No pathologically  enlarged mediastinal or hilar lymph nodes. Please note that accurate  exclusion of hilar adenopathy is limited on noncontrast CT scans.  Esophagus is unremarkable in appearance.  Lungs/Pleura: There are subtle noncalcified pleural plaques  throughout the thorax bilaterally. No definite calcified pleural  plaques are identified at this time. Mild diffuse bronchial wall  thickening with mild centrilobular and paraseptal emphysema, most  evident the lung apices bilaterally. In addition, there are patchy  areas of peripheral and peribronchovascular ground-glass attenuation  which are very mild, with some scattered areas of very mild  subpleural reticulation. No traction bronchiectasis or frank  honeycombing noted at this time. Inspiratory and expiratory images  demonstrate minimal air trapping in the lungs bilaterally. No  suspicious appearing pulmonary nodules or masses. No acute  consolidative airspace disease. No pleural effusions.  Upper Abdomen: 4.1 cm well-defined low-attenuation lesion in the  left lobe of the liver is incompletely characterized on today's  noncontrast CT examination, but was previously characterized as a  septated cyst on ultrasound examination 12/27/2013.  Musculoskeletal: There are no aggressive appearing lytic or blastic  lesions noted in the visualized portions of the skeleton.  IMPRESSION:  1. There are patchy areas of very mild ground-glass attenuation and  subpleural reticulation in the lungs bilaterally, which could  indicate interstitial lung disease such as nonspecific interstitial  pneumonia (NSIP). Repeat high-resolution chest CT in 1 year is  suggested to assess for temporal changes in the appearance of the  lung parenchyma.  2. There are multiple noncalcified pleural plaques bilaterally. No  calcified pleural plaques  are noted. This is nonspecific, and could  relate to prior episodes of infection, or could be seen in the  setting of very early asbestos related pleural disease. Clinical  correlation for history of asbestos exposure is suggested. If there  is indeed a history of asbestos exposure, the findings in the lung  parenchyma could represent early changes of asbestosis.  3. No suspicious  appearing pulmonary nodules or masses to suggest  metastatic disease.  4. Very mild air trapping indicative of mild small airways disease.  5. Atherosclerosis, including left main and 3 vessel coronary artery  disease. Please note that although the presence of coronary artery  calcium documents the presence of coronary artery disease, the  severity of this disease and any potential stenosis cannot be  assessed on this non-gated CT examination. Assessment for potential  risk factor modification, dietary therapy or pharmacologic therapy  may be warranted, if clinically indicated.  6. Additional incidental findings, as above.  Electronically Signed  By: Vinnie Langton M.D.  On: 04/20/2014 09:41   Pulmonary function testing FVC 4.37 (90%) FEV1 3.58 (97%) FEV1/FVC 0.82 TLC 6.11 (81%) DLCO 17.92 (55%)  Impression: 58 year old man with a complex medical history, including 2 recent surgeries over the past several months, who likely has early interstitial lung disease. He is on oxygen at 4 L nasal cannula at home. Interestingly he came into the office without his oxygen and his saturation was 94%. His diffusion capacity is approximately 50% of normal by PFTs. Dr. Chase Caller wishes to have a lung biopsy for diagnostic purposes.  I discussed the general nature of the procedure, left VATS with lung biopsy, with Mr. Buda. We discussed the need for general anesthesia, the incisions to be used, the expected hospital stay, and overall recovery. He understands that this is strictly a diagnostic procedure and has no  therapeutic benefit. I reviewed the indications, risks, benefits, and alternatives. He understands the risks include,  but are not limited to death, stroke, MI, DVT/PE, bleeding, possible need for transfusion, infections, prolonged air leak, cardiac arrhythmias, and other unforeseeable complications. He understands the biopsies may not yield a definitive diagnosis.  He wishes to proceed with lung biopsy as soon as possible.   He has been cleared for surgery from a cardiac standpoint by Dr. Domenic Polite.  Plan: Left VATS, lung biopsy on Friday, October 30

## 2014-05-15 NOTE — Telephone Encounter (Signed)
lmtcb for pt.  Pt had appt with Dr. Roxan Hockey on 05/15/14. Will forward to MR to make aware.

## 2014-05-17 ENCOUNTER — Encounter (HOSPITAL_COMMUNITY)
Admission: RE | Admit: 2014-05-17 | Discharge: 2014-05-17 | Disposition: A | Discharge status: Home / Self Care | Payer: Medicaid Other | Source: Ambulatory Visit | Attending: Thoracic Surgery (Cardiothoracic Vascular Surgery) | Admitting: Thoracic Surgery (Cardiothoracic Vascular Surgery)

## 2014-05-17 ENCOUNTER — Ambulatory Visit (HOSPITAL_COMMUNITY)
Admission: RE | Admit: 2014-05-17 | Discharge: 2014-05-17 | Disposition: A | Discharge status: Home / Self Care | Payer: Medicaid Other | Source: Ambulatory Visit | Attending: Thoracic Surgery (Cardiothoracic Vascular Surgery) | Admitting: Thoracic Surgery (Cardiothoracic Vascular Surgery)

## 2014-05-17 ENCOUNTER — Encounter (HOSPITAL_COMMUNITY): Payer: Self-pay

## 2014-05-17 ENCOUNTER — Encounter (HOSPITAL_COMMUNITY): Payer: Self-pay | Admitting: Pharmacy Technician

## 2014-05-17 ENCOUNTER — Other Ambulatory Visit: Payer: Self-pay

## 2014-05-17 VITALS — BP 121/63 | HR 75 | Temp 97.8°F | Resp 20 | Ht 69.0 in | Wt 232.0 lb

## 2014-05-17 DIAGNOSIS — J849 Interstitial pulmonary disease, unspecified: Secondary | ICD-10-CM

## 2014-05-17 HISTORY — DX: Sleep apnea, unspecified: G47.30

## 2014-05-17 LAB — COMPREHENSIVE METABOLIC PANEL
ALK PHOS: 81 U/L (ref 39–117)
ALT: 13 U/L (ref 0–53)
AST: 12 U/L (ref 0–37)
Albumin: 3.5 g/dL (ref 3.5–5.2)
Anion gap: 16 — ABNORMAL HIGH (ref 5–15)
BUN: 23 mg/dL (ref 6–23)
CO2: 22 mEq/L (ref 19–32)
Calcium: 9.2 mg/dL (ref 8.4–10.5)
Chloride: 97 mEq/L (ref 96–112)
Creatinine, Ser: 1.82 mg/dL — ABNORMAL HIGH (ref 0.50–1.35)
GFR calc Af Amer: 46 mL/min — ABNORMAL LOW (ref >90)
GFR calc non Af Amer: 39 mL/min — ABNORMAL LOW (ref >90)
GLUCOSE: 129 mg/dL — AB (ref 70–99)
POTASSIUM: 3.8 meq/L (ref 3.7–5.3)
SODIUM: 135 meq/L — AB (ref 137–147)
TOTAL PROTEIN: 7.2 g/dL (ref 6.0–8.3)
Total Bilirubin: 0.2 mg/dL — ABNORMAL LOW (ref 0.3–1.2)

## 2014-05-17 LAB — SURGICAL PCR SCREEN
MRSA, PCR: NEGATIVE
STAPHYLOCOCCUS AUREUS: NEGATIVE

## 2014-05-17 LAB — BLOOD GAS, ARTERIAL
Acid-Base Excess: 0.1 mmol/L (ref 0.0–2.0)
BICARBONATE: 23.7 meq/L (ref 20.0–24.0)
Drawn by: 421801
O2 Content: 4 L/min
O2 Saturation: 97.4 %
PO2 ART: 83.7 mmHg (ref 80.0–100.0)
Patient temperature: 98.6
TCO2: 24.8 mmol/L (ref 0–100)
pCO2 arterial: 35.3 mmHg (ref 35.0–45.0)
pH, Arterial: 7.443 (ref 7.350–7.450)

## 2014-05-17 LAB — CBC
HCT: 40.6 % (ref 39.0–52.0)
HEMOGLOBIN: 13.2 g/dL (ref 13.0–17.0)
MCH: 27.4 pg (ref 26.0–34.0)
MCHC: 32.5 g/dL (ref 30.0–36.0)
MCV: 84.2 fL (ref 78.0–100.0)
Platelets: 206 10*3/uL (ref 150–400)
RBC: 4.82 MIL/uL (ref 4.22–5.81)
RDW: 14.5 % (ref 11.5–15.5)
WBC: 8.9 10*3/uL (ref 4.0–10.5)

## 2014-05-17 LAB — APTT: aPTT: 34 seconds (ref 24–37)

## 2014-05-17 LAB — PROTIME-INR
INR: 1.03 (ref 0.00–1.49)
Prothrombin Time: 13.7 seconds (ref 11.6–15.2)

## 2014-05-17 LAB — TYPE AND SCREEN
ABO/RH(D): B POS
ANTIBODY SCREEN: NEGATIVE

## 2014-05-17 LAB — ABO/RH: ABO/RH(D): B POS

## 2014-05-17 MED ORDER — CEFUROXIME SODIUM 1.5 G IJ SOLR
1.5000 g | INTRAMUSCULAR | Status: AC
  Administered 2014-05-18: 1.5 g via INTRAVENOUS
  Filled 2014-05-17: qty 1.5

## 2014-05-17 NOTE — Telephone Encounter (Signed)
Spoke to patient. Verbally consented him for BRAVE study yesterday. Needs BAL and TTbx by Dr Roxan Hockey prior to surgical lung bx. Stacey phelps coordinating. Protocol emailed to Dr Roxan Hockey. Will close messaage    Dr. Brand Males, M.D., [facility].C.P Pulmonary and Critical Care Medicine Staff Physician [place] [facility] Pager: [phone], If no answer or between  15:00h - 7:00h: call [phone]  05/17/2014 5:51 AM

## 2014-05-17 NOTE — Pre-Procedure Instructions (Signed)
DEMTRIUS ROUNDS  05/17/2014   Your procedure is scheduled on:  05/18/14  Report to [facility] Admitting at 530 AM.  Call this number if you have problems the morning of surgery: [phone]   Remember:   Do not eat food or drink liquids after midnight.   Take these medicines the morning of surgery with A SIP OF WATER: diltiazem,prozac,all inhalers ,neurontin   Do not wear jewelry, make-up or nail polish.  Do not wear lotions, powders, or perfumes. You may wear deodorant.  Do not shave 48 hours prior to surgery. Men may shave face and neck.  Do not bring valuables to the hospital.  [facility] is not responsible                  for any belongings or valuables.               Contacts, dentures or bridgework may not be worn into surgery.  Leave suitcase in the car. After surgery it may be brought to your room.  For patients admitted to the hospital, discharge time is determined by your                treatment team.               Patients discharged the day of surgery will not be allowed to drive  home.  Name and phone number of your driver: family  Special Instructions: Shower using CHG 2 nights before surgery and the night before surgery.  If you shower the day of surgery use CHG.  Use special wash - you have one bottle of CHG for all showers.  You should use approximately 1/3 of the bottle for each shower.   Please read over the following fact sheets that you were given: Pain Booklet, Coughing and Deep Breathing, Blood Transfusion Information, MRSA Information and Surgical Site Infection Prevention

## 2014-05-17 NOTE — Progress Notes (Signed)
Pt called regarding time change. Pt verbalized understanding to arrive at 0700 on 05/18/14.

## 2014-05-17 NOTE — Progress Notes (Signed)
Anesthesia Chart Review:  Patient left VATS, lung biopsy on 05/18/14 by Dr. Roxan Hockey. Patient was referred for further evaluation of ILD.    History includes ILD, smoking, HTN, COPD with home O2 (4L/[place]), DM2, GERD, CAD s/p mid LAD stent 02/2013, OSA, renal cell carcinoma s/p right nephrectomy (hospital course complicated by hypoxic respiratory failure and delirium), anxiety, depression, hepatitis (not specified), cholecystectomy, TIA, non-compliance, polysubstance abuse including cocaine (+ UDS 12/18/13). PCP is Dr. Betty [name].  Pulmonologist is Dr. Chase Caller.  Cardiologist is Dr. Domenic Polite who did not recommend any additional cardiac testing prior to surgery.  Echo on 02/25/14 showed:  - Left ventricle: The cavity size was normal. Wall thickness was increased in a pattern of mild LVH. Systolic function was vigorous. The estimated ejection fraction was in the range of 65% to 70%. Wall motion was normal; there were no regional wall motion abnormalities. Doppler parameters are consistent with abnormal left ventricular relaxation (grade 1 diastolic dysfunction). The E/e&' ratio is between 8-15, suggesting indeterminate LV filling pressure. - Left atrium: The atrium was normal in size. Impressions: Compared to the prior echo in 2013, there are no significant changes.  12/20/13 cardiac cath showed:  1. Double vessel CAD with patent stent mid LAD (40% proximal LAD) 2. Moderate non-obstructive disease in the Circumflex (50% OM) and RCA (40% mid RCA) 3. Normal LV systolic function, EF 97%  4. Ongoing tobacco abuse  5. Ongoing cocaine abuse  Recommendations: Continue medical management of CAD. It is likely that his exertional chest pain is related to small vessel disease that is worsened with cocaine use and tobacco use. Complete cessation of cocaine and tobacco recommended.   EKG 05/17/14: NSR, low voltage QRS, possible inferior infarct (age undetermined)  05/17/14 CXR report is still pending.  04/13/14  Pulmonary function testing  FVC 4.37 (90%)  FEV1 3.58 (97%)  FEV1/FVC 0.82  TLC 6.11 (81%)  DLCO 17.92 (55%)  Preoperative labs noted.  Cr 1.82, which appears at the higher end of his baseline when compared to labs in Epic since 02/2014.  AST/ALT, CBC, PT/PTT WNL.   Patient tolerated right nephrectomy within the past three months.  Cardiology is aware of plans for this procedure.  If no acute changes then I would anticipate that he could proceed as planned.  George Hugh [facility]/Anesthesiology Phone [phone] 05/17/2014 4:26 PM

## 2014-05-18 ENCOUNTER — Inpatient Hospital Stay (HOSPITAL_COMMUNITY): Payer: Medicaid Other | Admitting: Anesthesiology

## 2014-05-18 ENCOUNTER — Encounter (HOSPITAL_COMMUNITY): Payer: Medicaid Other | Admitting: Vascular Surgery

## 2014-05-18 ENCOUNTER — Inpatient Hospital Stay (HOSPITAL_COMMUNITY): Payer: Medicaid Other

## 2014-05-18 ENCOUNTER — Encounter (HOSPITAL_COMMUNITY): Payer: Self-pay | Admitting: *Deleted

## 2014-05-18 ENCOUNTER — Inpatient Hospital Stay (HOSPITAL_COMMUNITY)
Admission: RE | Admit: 2014-05-18 | Discharge: 2014-05-25 | DRG: 166 | Disposition: A | Discharge status: Home / Self Care | Payer: Medicaid Other | Source: Ambulatory Visit | Attending: Thoracic Surgery (Cardiothoracic Vascular Surgery) | Admitting: Thoracic Surgery (Cardiothoracic Vascular Surgery)

## 2014-05-18 ENCOUNTER — Encounter (HOSPITAL_COMMUNITY)
Admission: RE | Disposition: A | Discharge status: Home / Self Care | Payer: Medicaid Other | Source: Ambulatory Visit | Attending: Thoracic Surgery (Cardiothoracic Vascular Surgery)

## 2014-05-18 DIAGNOSIS — J438 Other emphysema: Secondary | ICD-10-CM | POA: Insufficient documentation

## 2014-05-18 DIAGNOSIS — J9621 Acute and chronic respiratory failure with hypoxia: Secondary | ICD-10-CM | POA: Insufficient documentation

## 2014-05-18 DIAGNOSIS — Z9119 Patient's noncompliance with other medical treatment and regimen: Secondary | ICD-10-CM | POA: Diagnosis present

## 2014-05-18 DIAGNOSIS — E1165 Type 2 diabetes mellitus with hyperglycemia: Secondary | ICD-10-CM | POA: Diagnosis present

## 2014-05-18 DIAGNOSIS — Z833 Family history of diabetes mellitus: Secondary | ICD-10-CM | POA: Diagnosis not present

## 2014-05-18 DIAGNOSIS — Z7902 Long term (current) use of antithrombotics/antiplatelets: Secondary | ICD-10-CM | POA: Diagnosis not present

## 2014-05-18 DIAGNOSIS — F1410 Cocaine abuse, uncomplicated: Secondary | ICD-10-CM | POA: Diagnosis present

## 2014-05-18 DIAGNOSIS — I2510 Atherosclerotic heart disease of native coronary artery without angina pectoris: Secondary | ICD-10-CM | POA: Diagnosis present

## 2014-05-18 DIAGNOSIS — J449 Chronic obstructive pulmonary disease, unspecified: Secondary | ICD-10-CM | POA: Diagnosis present

## 2014-05-18 DIAGNOSIS — G4733 Obstructive sleep apnea (adult) (pediatric): Secondary | ICD-10-CM | POA: Diagnosis present

## 2014-05-18 DIAGNOSIS — J849 Interstitial pulmonary disease, unspecified: Secondary | ICD-10-CM | POA: Diagnosis present

## 2014-05-18 DIAGNOSIS — I10 Essential (primary) hypertension: Secondary | ICD-10-CM | POA: Diagnosis present

## 2014-05-18 DIAGNOSIS — F17210 Nicotine dependence, cigarettes, uncomplicated: Secondary | ICD-10-CM | POA: Diagnosis present

## 2014-05-18 DIAGNOSIS — Z955 Presence of coronary angioplasty implant and graft: Secondary | ICD-10-CM

## 2014-05-18 DIAGNOSIS — Z6834 Body mass index (BMI) 34.0-34.9, adult: Secondary | ICD-10-CM | POA: Diagnosis not present

## 2014-05-18 DIAGNOSIS — Z7982 Long term (current) use of aspirin: Secondary | ICD-10-CM | POA: Diagnosis not present

## 2014-05-18 DIAGNOSIS — Z85528 Personal history of other malignant neoplasm of kidney: Secondary | ICD-10-CM | POA: Diagnosis not present

## 2014-05-18 DIAGNOSIS — Z905 Acquired absence of kidney: Secondary | ICD-10-CM | POA: Diagnosis present

## 2014-05-18 DIAGNOSIS — I5043 Acute on chronic combined systolic (congestive) and diastolic (congestive) heart failure: Secondary | ICD-10-CM | POA: Diagnosis present

## 2014-05-18 DIAGNOSIS — Z8673 Personal history of transient ischemic attack (TIA), and cerebral infarction without residual deficits: Secondary | ICD-10-CM | POA: Diagnosis not present

## 2014-05-18 DIAGNOSIS — J811 Chronic pulmonary edema: Secondary | ICD-10-CM | POA: Insufficient documentation

## 2014-05-18 DIAGNOSIS — R339 Retention of urine, unspecified: Secondary | ICD-10-CM | POA: Diagnosis not present

## 2014-05-18 DIAGNOSIS — Z8249 Family history of ischemic heart disease and other diseases of the circulatory system: Secondary | ICD-10-CM | POA: Diagnosis not present

## 2014-05-18 DIAGNOSIS — Z823 Family history of stroke: Secondary | ICD-10-CM | POA: Diagnosis not present

## 2014-05-18 DIAGNOSIS — E669 Obesity, unspecified: Secondary | ICD-10-CM | POA: Diagnosis present

## 2014-05-18 DIAGNOSIS — Z9889 Other specified postprocedural states: Secondary | ICD-10-CM

## 2014-05-18 DIAGNOSIS — Z801 Family history of malignant neoplasm of trachea, bronchus and lung: Secondary | ICD-10-CM

## 2014-05-18 DIAGNOSIS — J9811 Atelectasis: Secondary | ICD-10-CM | POA: Diagnosis not present

## 2014-05-18 DIAGNOSIS — E785 Hyperlipidemia, unspecified: Secondary | ICD-10-CM | POA: Diagnosis present

## 2014-05-18 DIAGNOSIS — E876 Hypokalemia: Secondary | ICD-10-CM | POA: Diagnosis not present

## 2014-05-18 DIAGNOSIS — K219 Gastro-esophageal reflux disease without esophagitis: Secondary | ICD-10-CM | POA: Diagnosis present

## 2014-05-18 DIAGNOSIS — J939 Pneumothorax, unspecified: Secondary | ICD-10-CM

## 2014-05-18 DIAGNOSIS — Z9981 Dependence on supplemental oxygen: Secondary | ICD-10-CM | POA: Diagnosis not present

## 2014-05-18 DIAGNOSIS — Z09 Encounter for follow-up examination after completed treatment for conditions other than malignant neoplasm: Secondary | ICD-10-CM

## 2014-05-18 DIAGNOSIS — R0602 Shortness of breath: Secondary | ICD-10-CM | POA: Diagnosis present

## 2014-05-18 HISTORY — PX: LUNG BIOPSY: SHX5088

## 2014-05-18 HISTORY — PX: VIDEO BRONCHOSCOPY: SHX5072

## 2014-05-18 HISTORY — PX: VIDEO ASSISTED THORACOSCOPY: SHX5073

## 2014-05-18 LAB — GLUCOSE, CAPILLARY
GLUCOSE-CAPILLARY: 133 mg/dL — AB (ref 70–99)
GLUCOSE-CAPILLARY: 153 mg/dL — AB (ref 70–99)
Glucose-Capillary: 149 mg/dL — ABNORMAL HIGH (ref 70–99)

## 2014-05-18 SURGERY — VIDEO ASSISTED THORACOSCOPY
Anesthesia: General | Site: Chest | Laterality: Left

## 2014-05-18 MED ORDER — SODIUM CHLORIDE 0.9 % IJ SOLN: 9.0000 mL | INTRAMUSCULAR | Status: DC | PRN

## 2014-05-18 MED ORDER — FUROSEMIDE 40 MG PO TABS
40.0000 mg | ORAL_TABLET | Freq: Every day | ORAL | Status: DC
Start: 2014-05-18 — End: 2014-05-24
  Administered 2014-05-18 – 2014-05-23 (×6): 40 mg via ORAL
  Filled 2014-05-18 (×7): qty 1

## 2014-05-18 MED ORDER — ROCURONIUM BROMIDE 100 MG/10ML IV SOLN
INTRAVENOUS | Status: DC | PRN
  Administered 2014-05-18: 30 mg via INTRAVENOUS
  Administered 2014-05-18 (×2): 20 mg via INTRAVENOUS

## 2014-05-18 MED ORDER — ALFUZOSIN HCL ER 10 MG PO TB24
10.0000 mg | ORAL_TABLET | Freq: Every day | ORAL | Status: DC
Start: 2014-05-19 — End: 2014-05-25
  Administered 2014-05-19 – 2014-05-25 (×7): 10 mg via ORAL
  Filled 2014-05-18 (×8): qty 1

## 2014-05-18 MED ORDER — DIPHENHYDRAMINE HCL 50 MG/ML IJ SOLN: 12.5000 mg | Freq: Four times a day (QID) | INTRAMUSCULAR | Status: DC | PRN

## 2014-05-18 MED ORDER — GLIPIZIDE 10 MG PO TABS
10.0000 mg | ORAL_TABLET | Freq: Two times a day (BID) | ORAL | Status: DC
  Administered 2014-05-18 – 2014-05-25 (×14): 10 mg via ORAL
  Filled 2014-05-18 (×16): qty 1

## 2014-05-18 MED ORDER — FLUTICASONE PROPIONATE HFA 220 MCG/ACT IN AERO
1.0000 | INHALATION_SPRAY | Freq: Two times a day (BID) | RESPIRATORY_TRACT | Status: DC
  Administered 2014-05-18 – 2014-05-21 (×6): 1 via RESPIRATORY_TRACT
  Filled 2014-05-18: qty 12

## 2014-05-18 MED ORDER — NEOSTIGMINE METHYLSULFATE 10 MG/10ML IV SOLN
INTRAVENOUS | Status: DC | PRN
  Administered 2014-05-18: 5 mg via INTRAVENOUS

## 2014-05-18 MED ORDER — LACTATED RINGERS IV SOLN: INTRAVENOUS | Status: DC

## 2014-05-18 MED ORDER — HYDROMORPHONE HCL 1 MG/ML IJ SOLN
0.2500 mg | INTRAMUSCULAR | Status: DC | PRN
  Administered 2014-05-18 (×2): 0.5 mg via INTRAVENOUS

## 2014-05-18 MED ORDER — 0.9 % SODIUM CHLORIDE (POUR BTL) OPTIME
TOPICAL | Status: DC | PRN
  Administered 2014-05-18: 4000 mL

## 2014-05-18 MED ORDER — ONDANSETRON HCL 4 MG/2ML IJ SOLN
INTRAMUSCULAR | Status: DC | PRN
  Administered 2014-05-18: 4 mg via INTRAVENOUS

## 2014-05-18 MED ORDER — SODIUM CHLORIDE 0.9 % IJ SOLN
INTRAMUSCULAR | Status: AC
  Filled 2014-05-18: qty 10

## 2014-05-18 MED ORDER — OXYCODONE HCL 5 MG/5ML PO SOLN: 5.0000 mg | Freq: Once | ORAL | Status: DC | PRN

## 2014-05-18 MED ORDER — TRAMADOL HCL 50 MG PO TABS
50.0000 mg | ORAL_TABLET | Freq: Four times a day (QID) | ORAL | Status: DC | PRN
  Filled 2014-05-18: qty 2

## 2014-05-18 MED ORDER — FENTANYL CITRATE 0.05 MG/ML IJ SOLN
INTRAMUSCULAR | Status: AC
  Filled 2014-05-18: qty 5

## 2014-05-18 MED ORDER — BISACODYL 5 MG PO TBEC
10.0000 mg | DELAYED_RELEASE_TABLET | Freq: Every day | ORAL | Status: DC
  Administered 2014-05-19 – 2014-05-24 (×4): 10 mg via ORAL
  Filled 2014-05-18 (×5): qty 2

## 2014-05-18 MED ORDER — MIDAZOLAM HCL 5 MG/5ML IJ SOLN
INTRAMUSCULAR | Status: DC | PRN
  Administered 2014-05-18: 2 mg via INTRAVENOUS

## 2014-05-18 MED ORDER — HYDROMORPHONE HCL 1 MG/ML IJ SOLN
INTRAMUSCULAR | Status: AC
  Administered 2014-05-18: 0.5 mg via INTRAVENOUS
  Filled 2014-05-18: qty 2

## 2014-05-18 MED ORDER — KCL IN DEXTROSE-NACL 20-5-0.45 MEQ/L-%-% IV SOLN
INTRAVENOUS | Status: DC
  Administered 2014-05-18 – 2014-05-19 (×2): via INTRAVENOUS
  Filled 2014-05-18 (×5): qty 1000

## 2014-05-18 MED ORDER — DIPHENHYDRAMINE HCL 12.5 MG/5ML PO ELIX
12.5000 mg | ORAL_SOLUTION | Freq: Four times a day (QID) | ORAL | Status: DC | PRN
  Filled 2014-05-18: qty 5

## 2014-05-18 MED ORDER — ACETAMINOPHEN 160 MG/5ML PO SOLN
1000.0000 mg | Freq: Four times a day (QID) | ORAL | Status: AC
  Filled 2014-05-18 (×20): qty 40

## 2014-05-18 MED ORDER — ONDANSETRON HCL 4 MG/2ML IJ SOLN: 4.0000 mg | Freq: Once | INTRAMUSCULAR | Status: DC | PRN

## 2014-05-18 MED ORDER — EPHEDRINE SULFATE 50 MG/ML IJ SOLN
INTRAMUSCULAR | Status: AC
  Filled 2014-05-18: qty 1

## 2014-05-18 MED ORDER — OXYCODONE HCL 5 MG PO TABS: 5.0000 mg | ORAL_TABLET | ORAL | Status: DC | PRN

## 2014-05-18 MED ORDER — FENTANYL CITRATE 0.05 MG/ML IJ SOLN
INTRAMUSCULAR | Status: DC | PRN
  Administered 2014-05-18: 25 ug via INTRAVENOUS
  Administered 2014-05-18: 50 ug via INTRAVENOUS
  Administered 2014-05-18: 100 ug via INTRAVENOUS
  Administered 2014-05-18: 25 ug via INTRAVENOUS
  Administered 2014-05-18: 50 ug via INTRAVENOUS

## 2014-05-18 MED ORDER — ONDANSETRON HCL 4 MG/2ML IJ SOLN: 4.0000 mg | Freq: Four times a day (QID) | INTRAMUSCULAR | Status: DC | PRN

## 2014-05-18 MED ORDER — ALBUTEROL SULFATE HFA 108 (90 BASE) MCG/ACT IN AERS: 2.0000 | INHALATION_SPRAY | Freq: Four times a day (QID) | RESPIRATORY_TRACT | Status: DC | PRN

## 2014-05-18 MED ORDER — FENTANYL 10 MCG/ML IV SOLN
INTRAVENOUS | Status: DC
  Administered 2014-05-18: 30 ug via INTRAVENOUS
  Administered 2014-05-19: 90 ug via INTRAVENOUS
  Administered 2014-05-19: 15 ug via INTRAVENOUS
  Administered 2014-05-19: 60 ug via INTRAVENOUS
  Administered 2014-05-19: 105 ug via INTRAVENOUS
  Administered 2014-05-19: 16:00:00 via INTRAVENOUS
  Administered 2014-05-19: 105 ug via INTRAVENOUS
  Administered 2014-05-19: 60 ug via INTRAVENOUS
  Administered 2014-05-20: 15 ug via INTRAVENOUS
  Administered 2014-05-20: 75 ug via INTRAVENOUS
  Administered 2014-05-20: 45 ug via INTRAVENOUS
  Administered 2014-05-20: 60 ug via INTRAVENOUS
  Administered 2014-05-21: 30 ug via INTRAVENOUS
  Administered 2014-05-21: 45 ug via INTRAVENOUS
  Filled 2014-05-18 (×2): qty 50

## 2014-05-18 MED ORDER — IPRATROPIUM-ALBUTEROL 0.5-2.5 (3) MG/3ML IN SOLN
3.0000 mL | Freq: Four times a day (QID) | RESPIRATORY_TRACT | Status: DC | PRN
  Administered 2014-05-19: 3 mL via RESPIRATORY_TRACT
  Filled 2014-05-18: qty 3

## 2014-05-18 MED ORDER — SODIUM CHLORIDE 0.9 % IV SOLN
10.0000 mg | INTRAVENOUS | Status: DC | PRN
  Administered 2014-05-18: 25 ug/min via INTRAVENOUS

## 2014-05-18 MED ORDER — MIDAZOLAM HCL 2 MG/2ML IJ SOLN
INTRAMUSCULAR | Status: AC
  Filled 2014-05-18: qty 2

## 2014-05-18 MED ORDER — LIDOCAINE HCL (CARDIAC) 20 MG/ML IV SOLN
INTRAVENOUS | Status: DC | PRN
  Administered 2014-05-18: 100 mg via INTRAVENOUS

## 2014-05-18 MED ORDER — DARIFENACIN HYDROBROMIDE ER 7.5 MG PO TB24
7.5000 mg | ORAL_TABLET | Freq: Every day | ORAL | Status: DC
  Administered 2014-05-19 – 2014-05-25 (×7): 7.5 mg via ORAL
  Filled 2014-05-18 (×7): qty 1

## 2014-05-18 MED ORDER — ARTIFICIAL TEARS OP OINT
TOPICAL_OINTMENT | OPHTHALMIC | Status: AC
  Filled 2014-05-18: qty 3.5

## 2014-05-18 MED ORDER — LIDOCAINE HCL (CARDIAC) 20 MG/ML IV SOLN
INTRAVENOUS | Status: AC
  Filled 2014-05-18: qty 5

## 2014-05-18 MED ORDER — CITALOPRAM HYDROBROMIDE 20 MG PO TABS
20.0000 mg | ORAL_TABLET | Freq: Every day | ORAL | Status: DC
  Administered 2014-05-19 – 2014-05-25 (×7): 20 mg via ORAL
  Filled 2014-05-18 (×8): qty 1

## 2014-05-18 MED ORDER — SUCCINYLCHOLINE CHLORIDE 20 MG/ML IJ SOLN
INTRAMUSCULAR | Status: AC
  Filled 2014-05-18: qty 1

## 2014-05-18 MED ORDER — PROPOFOL 10 MG/ML IV BOLUS
INTRAVENOUS | Status: AC
  Filled 2014-05-18: qty 20

## 2014-05-18 MED ORDER — NEOSTIGMINE METHYLSULFATE 10 MG/10ML IV SOLN
INTRAVENOUS | Status: AC
  Filled 2014-05-18: qty 1

## 2014-05-18 MED ORDER — DILTIAZEM HCL ER COATED BEADS 240 MG PO CP24
240.0000 mg | ORAL_CAPSULE | Freq: Every morning | ORAL | Status: DC
  Administered 2014-05-19 – 2014-05-25 (×7): 240 mg via ORAL
  Filled 2014-05-18 (×7): qty 1

## 2014-05-18 MED ORDER — LACTATED RINGERS IV SOLN
INTRAVENOUS | Status: DC | PRN
  Administered 2014-05-18 (×2): via INTRAVENOUS

## 2014-05-18 MED ORDER — FLUOXETINE HCL 10 MG PO CAPS
10.0000 mg | ORAL_CAPSULE | Freq: Every morning | ORAL | Status: DC
Start: 2014-05-18 — End: 2014-05-18

## 2014-05-18 MED ORDER — POTASSIUM CHLORIDE 10 MEQ/50ML IV SOLN: 10.0000 meq | Freq: Every day | INTRAVENOUS | Status: DC | PRN

## 2014-05-18 MED ORDER — NALOXONE HCL 0.4 MG/ML IJ SOLN: 0.4000 mg | INTRAMUSCULAR | Status: DC | PRN

## 2014-05-18 MED ORDER — ACETAMINOPHEN 500 MG PO TABS
1000.0000 mg | ORAL_TABLET | Freq: Four times a day (QID) | ORAL | Status: AC
  Administered 2014-05-18 – 2014-05-22 (×15): 1000 mg via ORAL
  Filled 2014-05-18 (×21): qty 2

## 2014-05-18 MED ORDER — FAMOTIDINE 40 MG PO TABS
40.0000 mg | ORAL_TABLET | Freq: Every day | ORAL | Status: DC
  Administered 2014-05-18 – 2014-05-25 (×8): 40 mg via ORAL
  Filled 2014-05-18 (×8): qty 1

## 2014-05-18 MED ORDER — SENNOSIDES-DOCUSATE SODIUM 8.6-50 MG PO TABS
1.0000 | ORAL_TABLET | Freq: Every day | ORAL | Status: DC
  Administered 2014-05-18 – 2014-05-25 (×6): 1 via ORAL
  Filled 2014-05-18 (×9): qty 1

## 2014-05-18 MED ORDER — ONDANSETRON HCL 4 MG/2ML IJ SOLN
INTRAMUSCULAR | Status: AC
  Filled 2014-05-18: qty 2

## 2014-05-18 MED ORDER — ASPIRIN EC 81 MG PO TBEC
81.0000 mg | DELAYED_RELEASE_TABLET | Freq: Every day | ORAL | Status: DC
  Administered 2014-05-18 – 2014-05-25 (×8): 81 mg via ORAL
  Filled 2014-05-18 (×8): qty 1

## 2014-05-18 MED ORDER — GLYCOPYRROLATE 0.2 MG/ML IJ SOLN
INTRAMUSCULAR | Status: DC | PRN
  Administered 2014-05-18: .8 mg via INTRAVENOUS

## 2014-05-18 MED ORDER — LACTATED RINGERS IV SOLN
INTRAVENOUS | Status: DC | PRN
  Administered 2014-05-18: 08:00:00 via INTRAVENOUS

## 2014-05-18 MED ORDER — ISOSORBIDE MONONITRATE ER 60 MG PO TB24
60.0000 mg | ORAL_TABLET | Freq: Every morning | ORAL | Status: DC
  Administered 2014-05-18 – 2014-05-25 (×8): 60 mg via ORAL
  Filled 2014-05-18 (×8): qty 1

## 2014-05-18 MED ORDER — OXYCODONE HCL 5 MG PO TABS: 5.0000 mg | ORAL_TABLET | Freq: Once | ORAL | Status: DC | PRN

## 2014-05-18 MED ORDER — DEXTROSE 5 % IV SOLN
1.5000 g | Freq: Two times a day (BID) | INTRAVENOUS | Status: AC
  Administered 2014-05-18 – 2014-05-19 (×2): 1.5 g via INTRAVENOUS
  Filled 2014-05-18 (×2): qty 1.5

## 2014-05-18 MED ORDER — GABAPENTIN 300 MG PO CAPS
300.0000 mg | ORAL_CAPSULE | Freq: Every day | ORAL | Status: DC
  Administered 2014-05-19 – 2014-05-23 (×5): 300 mg via ORAL
  Filled 2014-05-18 (×7): qty 1

## 2014-05-18 MED ORDER — ROSUVASTATIN CALCIUM 20 MG PO TABS
20.0000 mg | ORAL_TABLET | Freq: Every day | ORAL | Status: DC
  Administered 2014-05-18 – 2014-05-25 (×8): 20 mg via ORAL
  Filled 2014-05-18 (×8): qty 1

## 2014-05-18 MED ORDER — PROPOFOL 10 MG/ML IV BOLUS
INTRAVENOUS | Status: DC | PRN
  Administered 2014-05-18: 125 mg via INTRAVENOUS

## 2014-05-18 MED ORDER — FENTANYL 10 MCG/ML IV SOLN
INTRAVENOUS | Status: DC
  Administered 2014-05-18: 12:00:00 via INTRAVENOUS
  Filled 2014-05-18: qty 50

## 2014-05-18 MED ORDER — ROCURONIUM BROMIDE 50 MG/5ML IV SOLN
INTRAVENOUS | Status: AC
  Filled 2014-05-18: qty 2

## 2014-05-18 MED ORDER — ASPIRIN 81 MG PO TABS: 81.0000 mg | ORAL_TABLET | Freq: Every day | ORAL | Status: DC

## 2014-05-18 MED ORDER — GLYCOPYRROLATE 0.2 MG/ML IJ SOLN
INTRAMUSCULAR | Status: AC
  Filled 2014-05-18: qty 5

## 2014-05-18 SURGICAL SUPPLY — 86 items
APPLIER CLIP ROT 10 11.4 M/L (STAPLE)
BRUSH CYTOL CELLEBRITY 1.5X140 (MISCELLANEOUS) IMPLANT
CANISTER SUCTION 2500CC (MISCELLANEOUS) ×3 IMPLANT
CATH KIT ON Q 5IN SLV (PAIN MANAGEMENT) IMPLANT
CATH THORACIC 28FR (CATHETERS) ×3 IMPLANT
CATH THORACIC 28FR RT ANG (CATHETERS) IMPLANT
CATH THORACIC 36FR (CATHETERS) IMPLANT
CATH THORACIC 36FR RT ANG (CATHETERS) IMPLANT
CLIP APPLIE ROT 10 11.4 M/L (STAPLE) IMPLANT
CLIP TI MEDIUM 6 (CLIP) IMPLANT
CONN Y 3/8X3/8X3/8  BEN (MISCELLANEOUS) ×1
CONN Y 3/8X3/8X3/8 BEN (MISCELLANEOUS) ×2 IMPLANT
CONT SPEC 4OZ CLIKSEAL STRL BL (MISCELLANEOUS) ×18 IMPLANT
COTTONBALL LRG STERILE PKG (GAUZE/BANDAGES/DRESSINGS) IMPLANT
COVER SURGICAL LIGHT HANDLE (MISCELLANEOUS) ×3 IMPLANT
COVER TABLE BACK 60X90 (DRAPES) ×3 IMPLANT
DERMABOND ADVANCED (GAUZE/BANDAGES/DRESSINGS) ×1
DERMABOND ADVANCED .7 DNX12 (GAUZE/BANDAGES/DRESSINGS) ×2 IMPLANT
DRAPE LAPAROSCOPIC ABDOMINAL (DRAPES) ×3 IMPLANT
DRAPE WARM FLUID 44X44 (DRAPE) ×3 IMPLANT
ELECT REM PT RETURN 9FT ADLT (ELECTROSURGICAL) ×3
ELECTRODE REM PT RTRN 9FT ADLT (ELECTROSURGICAL) ×2 IMPLANT
FILTER STRAW FLUID ASPIR (MISCELLANEOUS) IMPLANT
FORCEPS BIOP RJ4 1.8 (CUTTING FORCEPS) ×3 IMPLANT
GAUZE SPONGE 4X4 12PLY STRL (GAUZE/BANDAGES/DRESSINGS) ×3 IMPLANT
GLOVE BIOGEL M STER SZ 6 (GLOVE) ×3 IMPLANT
GLOVE BIOGEL PI IND STRL 6.5 (GLOVE) ×4 IMPLANT
GLOVE BIOGEL PI INDICATOR 6.5 (GLOVE) ×2
GLOVE SURG SIGNA 7.5 PF LTX (GLOVE) ×6 IMPLANT
GLOVE SURG SS PI 7.0 STRL IVOR (GLOVE) ×6 IMPLANT
GOWN STRL REUS W/ TWL LRG LVL3 (GOWN DISPOSABLE) ×4 IMPLANT
GOWN STRL REUS W/ TWL XL LVL3 (GOWN DISPOSABLE) ×6 IMPLANT
GOWN STRL REUS W/TWL LRG LVL3 (GOWN DISPOSABLE) ×2
GOWN STRL REUS W/TWL XL LVL3 (GOWN DISPOSABLE) ×3
HEMOSTAT SURGICEL 2X14 (HEMOSTASIS) IMPLANT
KIT BASIN OR (CUSTOM PROCEDURE TRAY) ×3 IMPLANT
KIT CLEAN ENDO COMPLIANCE (KITS) ×3 IMPLANT
KIT ROOM TURNOVER OR (KITS) ×3 IMPLANT
KIT SUCTION CATH 14FR (SUCTIONS) ×3 IMPLANT
NEEDLE 22X1 1/2 (OR ONLY) (NEEDLE) IMPLANT
NEEDLE BIOPSY TRANSBRONCH 21G (NEEDLE) IMPLANT
NS IRRIG 1000ML POUR BTL (IV SOLUTION) ×6 IMPLANT
PACK CHEST (CUSTOM PROCEDURE TRAY) ×3 IMPLANT
PAD ARMBOARD 7.5X6 YLW CONV (MISCELLANEOUS) ×6 IMPLANT
POUCH ENDO CATCH II 15MM (MISCELLANEOUS) IMPLANT
POUCH SPECIMEN RETRIEVAL 10MM (ENDOMECHANICALS) IMPLANT
RELOAD EGIA 45 MED/THCK PURPLE (STAPLE) ×6 IMPLANT
RELOAD EGIA 60 MED/THCK PURPLE (STAPLE) ×30 IMPLANT
SEALANT PROGEL (MISCELLANEOUS) IMPLANT
SEALANT SURG COSEAL 4ML (VASCULAR PRODUCTS) IMPLANT
SEALANT SURG COSEAL 8ML (VASCULAR PRODUCTS) IMPLANT
SOLUTION ANTI FOG 6CC (MISCELLANEOUS) ×3 IMPLANT
SPECIMEN JAR MEDIUM (MISCELLANEOUS) ×3 IMPLANT
SPONGE GAUZE 4X4 12PLY STER LF (GAUZE/BANDAGES/DRESSINGS) ×3 IMPLANT
SPONGE INTESTINAL PEANUT (DISPOSABLE) IMPLANT
SUT PROLENE 4 0 RB 1 (SUTURE)
SUT PROLENE 4-0 RB1 .5 CRCL 36 (SUTURE) IMPLANT
SUT SILK  1 MH (SUTURE) ×2
SUT SILK 1 MH (SUTURE) ×4 IMPLANT
SUT SILK 2 0SH CR/8 30 (SUTURE) IMPLANT
SUT SILK 3 0SH CR/8 30 (SUTURE) IMPLANT
SUT VIC AB 1 CTX 36 (SUTURE) ×1
SUT VIC AB 1 CTX36XBRD ANBCTR (SUTURE) ×2 IMPLANT
SUT VIC AB 2-0 CTX 36 (SUTURE) ×3 IMPLANT
SUT VIC AB 2-0 UR6 27 (SUTURE) IMPLANT
SUT VIC AB 3-0 MH 27 (SUTURE) IMPLANT
SUT VIC AB 3-0 X1 27 (SUTURE) ×3 IMPLANT
SUT VICRYL 2 TP 1 (SUTURE) IMPLANT
SWAB COLLECTION DEVICE MRSA (MISCELLANEOUS) IMPLANT
SYR 20ML ECCENTRIC (SYRINGE) ×3 IMPLANT
SYR 5ML LL (SYRINGE) ×3 IMPLANT
SYR 5ML LUER SLIP (SYRINGE) ×3 IMPLANT
SYR CONTROL 10ML LL (SYRINGE) IMPLANT
SYSTEM SAHARA CHEST DRAIN ATS (WOUND CARE) ×3 IMPLANT
TAPE CLOTH 4X10 WHT NS (GAUZE/BANDAGES/DRESSINGS) ×3 IMPLANT
TAPE CLOTH SURG 4X10 WHT LF (GAUZE/BANDAGES/DRESSINGS) ×3 IMPLANT
TIP APPLICATOR SPRAY EXTEND 16 (VASCULAR PRODUCTS) ×6 IMPLANT
TOWEL OR 17X24 6PK STRL BLUE (TOWEL DISPOSABLE) ×3 IMPLANT
TOWEL OR 17X26 10 PK STRL BLUE (TOWEL DISPOSABLE) ×6 IMPLANT
TRAP SPECIMEN MUCOUS 40CC (MISCELLANEOUS) ×12 IMPLANT
TRAY FOLEY CATH 16FRSI W/METER (SET/KITS/TRAYS/PACK) ×3 IMPLANT
TROCAR XCEL NON-BLD 5MMX100MML (ENDOMECHANICALS) ×3 IMPLANT
TUBE ANAEROBIC SPECIMEN COL (MISCELLANEOUS) IMPLANT
TUBE CONNECTING 20X1/4 (TUBING) ×3 IMPLANT
TUNNELER SHEATH ON-Q 11GX8 DSP (PAIN MANAGEMENT) IMPLANT
WATER STERILE IRR 1000ML POUR (IV SOLUTION) ×6 IMPLANT

## 2014-05-18 NOTE — Anesthesia Preprocedure Evaluation (Addendum)
Anesthesia Evaluation  Patient identified by MRN, date of birth, ID band Patient awake    Reviewed: Allergy & Precautions, H&P , NPO status , Patient's Chart, lab work & pertinent test results  Airway Mallampati: II  TM Distance: >3 FB Neck ROM: Full    Dental no notable dental hx. (+) Dental Advidsory Given, Edentulous Upper, Edentulous Lower   Pulmonary shortness of breath, sleep apnea , COPD oxygen dependent, Current Smoker,  breath sounds clear to auscultation  Pulmonary exam normal       Cardiovascular hypertension, + CAD and + Cardiac Stents Rhythm:Regular Rate:Normal  '13 ECHO: EF 65-70%, valves OK '14 cath: DES LAD, otherwise non-obstructive dz, EF 55%    Neuro/Psych PSYCHIATRIC DISORDERS TIAnegative neurological ROS  negative psych ROS   GI/Hepatic negative GI ROS, GERD-  ,(+)     substance abuse  cocaine use,   Endo/Other  diabetes, Oral Hypoglycemic AgentsMorbid obesity  Renal/GU Renal disease (s/p nephrectomy )     Musculoskeletal negative musculoskeletal ROS (+)   Abdominal   Peds negative pediatric ROS (+)  Hematology negative hematology ROS (+)   Anesthesia Other Findings   Reproductive/Obstetrics negative OB ROS                          Anesthesia Physical Anesthesia Plan  ASA: III  Anesthesia Plan: General ETT   Post-op Pain Management:    Induction: Intravenous  Airway Management Planned: Oral ETT  Additional Equipment: Arterial line  Intra-op Plan:   Post-operative Plan: Extubation in OR  Informed Consent: I have reviewed the patients History and Physical, chart, labs and discussed the procedure including the risks, benefits and alternatives for the proposed anesthesia with the patient or authorized representative who has indicated his/her understanding and acceptance.   Dental Advisory Given  Plan Discussed with: CRNA, Surgeon and  Anesthesiologist  Anesthesia Plan Comments:       Anesthesia Quick Evaluation

## 2014-05-18 NOTE — Progress Notes (Signed)
Nutrition Brief Note  Patient identified on the Malnutrition Screening Tool (MST) Report  Pt is s/p VATS 10/30 as part of an experimental study. Pt reports no recent weight changes and good appetite PTA. Weight range has been 230-239 lb x 4 months.   Wt Readings from Last 15 Encounters:  05/18/14 232 lb (105.235 kg)  05/18/14 232 lb (105.235 kg)  05/17/14 232 lb (105.235 kg)  05/15/14 236 lb (107.049 kg)  05/04/14 236 lb 6.4 oz (107.23 kg)  05/03/14 231 lb 12 oz (105.121 kg)  04/30/14 238 lb 12.8 oz (108.319 kg)  04/13/14 234 lb (106.142 kg)  03/21/14 239 lb (108.41 kg)  02/26/14 230 lb 6.1 oz (104.5 kg)  02/26/14 230 lb 6.1 oz (104.5 kg)  02/14/14 238 lb 6 oz (108.126 kg)  02/07/14 238 lb (107.956 kg)  01/16/14 232 lb (105.235 kg)  12/27/13 239 lb (108.41 kg)    Body mass index is 34.24 kg/(m^2). Patient meets criteria for Obesity class I based on current BMI.   Current diet order is NPO after VATS. Labs and medications reviewed.   No nutrition interventions warranted at this time. If nutrition issues arise, please consult RD.   [place], [place], [place] Pager [phone] After Hours Pager

## 2014-05-18 NOTE — Discharge Summary (Signed)
Physician Discharge Summary       [address]       [place] [address]             [phone]    Patient ID: [name] MRN: [id number] DOB/AGE: Feb 21, 1956 58 y.o.  Admit date: 05/18/2014 Discharge date: 05/25/2014  Admission Diagnoses: 1. Possible ILD 2. History of benign, essential hypertension 3. History of COPD 4. History of TIA 62' 5. History of hyperlipidemia 6. History of of Type 2 DM 7. History of GERD 8. History of CAD (s/p PCI with stent) 9. History of renal cell carcinoma (s/p radical nephrectomy 15') 10. History of substance abuse  Discharge Diagnoses:  1. Possible ILD 2. History of benign, essential hypertension 3. History of COPD 4. History of TIA 52' 5. History of hyperlipidemia 6. History of of Type 2 DM 7. History of GERD 8. History of CAD (s/p PCI with stent) 9. History of renal cell carcinoma (s/p radical nephrectomy 15') 10. History of substance abuse  Procedure (s):  VIDEO BRONCHOSCOPY with BAL and transbronchial biopsies  LEFT VIDEO ASSISTED THORACOSCOPY  Left upper lobe & left lower lobe biopsies x 4  Pathology: pending   History of Presenting Illness: This is a 58 year old man with a past medical history that includes coronary artery disease with a stent to his mid LAD in August 2014 he was treated with Plavix for that until August of this year. It has since been discontinued. He also has a history of polysubstance abuse including tobacco and cocaine, medical noncompliance, poorly controlled type 2 diabetes, congestive heart failure, hypertension, hyperlipidemia, and likely obstructive sleep apnea.  In June, he had a cholecystectomy. He had a laparoscopic right nephrectomy for renal cell cancer in August. His postoperative course was complicated by hypoxic respiratory failure and delirium. He was discharged on home oxygen and remains on oxygen since that time. He currently is using 4 L nasal cannula.  Complains of  shortness of breath with exertion. He continues to smoke 0.5 packs of cigarettes daily as he has for the past 40 years. He is not interested in quitting. He has not had any anginal type chest pain. He had a catheterization this summer which showed his stent in the LAD was patent and there was no hemodynamically significant stenoses in the circumflex or right coronary. He does complain of reflux and heartburn. He also suffers from anxiety and depression.  He recently had a CT which suggested mild early interstitial lung disease.   Brief Hospital Course:  He has remained afebrile and hemodynamically stable. Chest x ray is stable. There is no air leak. Chest tube was placed to water seal on 05/19/2014. Chest tube was removed 05/21/2014.Follow up chest x ray was stable. He has been followed by pulmonary/critical care postoperatively and adjustments have been made in his medications.  He has required oxygen via a non-rebreather, but with aggressive pulmonary toilet, he has been weaned to 6 liters.  He is otherwise stable.  Incisions are healing well.  Pathology remains pending.  He is medically stable on today's date for discharge home.    Latest Vital Signs: Blood pressure 126/69, pulse 55, temperature 97.5 F (36.4 C), temperature source Oral, resp. rate 19, height 5\' 9"  (1.753 m), weight 232 lb (105.235 kg), SpO2 94 %.  Physical Exam: Cardiovascular: RRR Pulmonary: Coarse breath sounds Abdomen: Soft, non tender, bowel sounds present. Extremities: SCDs in place Wounds:  clean and dry.     Discharge  Condition:Stable  Recent laboratory studies:  Lab Results  Component Value Date   WBC 8.0 05/24/2014   HGB 12.8* 05/24/2014   HCT 39.4 05/24/2014   MCV 86.0 05/24/2014   PLT 224 05/24/2014   Lab Results  Component Value Date   NA 137 05/25/2014   K 4.2 05/25/2014   CL 97 05/25/2014   CO2 26 05/25/2014   CREATININE 1.37* 05/25/2014   GLUCOSE 128* 05/25/2014      Diagnostic Studies:  Ct Chest High Resolution  04/20/2014   CLINICAL DATA:  58 year old male with recent history of dyspnea. Recent right-sided nephrectomy on 03/23/2014 for renal cancer. On oxygen since discharge from hospital. Evaluate for underlying interstitial lung disease.  EXAM: CT CHEST WITHOUT CONTRAST  TECHNIQUE: Multidetector CT imaging of the chest was performed following the standard protocol without intravenous contrast. High resolution imaging of the lungs, as well as inspiratory and expiratory imaging, was performed.  COMPARISON:  No priors.  FINDINGS: Mediastinum: Heart size is normal. There is no significant pericardial fluid, thickening or pericardial calcification. There is atherosclerosis of the thoracic aorta, the great vessels of the mediastinum and the coronary arteries, including calcified atherosclerotic plaque in the left main, left anterior descending, left circumflex and right coronary arteries. No pathologically enlarged mediastinal or hilar lymph nodes. Please note that accurate exclusion of hilar adenopathy is limited on noncontrast CT scans. Esophagus is unremarkable in appearance.  Lungs/Pleura: There are subtle noncalcified pleural plaques throughout the thorax bilaterally. No definite calcified pleural plaques are identified at this time. Mild diffuse bronchial wall thickening with mild centrilobular and paraseptal emphysema, most evident the lung apices bilaterally. In addition, there are patchy areas of peripheral and peribronchovascular ground-glass attenuation which are very mild, with some scattered areas of very mild subpleural reticulation. No traction bronchiectasis or frank honeycombing noted at this time. Inspiratory and expiratory images demonstrate minimal air trapping in the lungs bilaterally. No suspicious appearing pulmonary nodules or masses. No acute consolidative airspace disease. No pleural effusions.  Upper Abdomen: 4.1 cm well-defined low-attenuation lesion in the left lobe of  the liver is incompletely characterized on today's noncontrast CT examination, but was previously characterized as a septated cyst on ultrasound examination 12/27/2013.  Musculoskeletal: There are no aggressive appearing lytic or blastic lesions noted in the visualized portions of the skeleton.  IMPRESSION: 1. There are patchy areas of very mild ground-glass attenuation and subpleural reticulation in the lungs bilaterally, which could indicate interstitial lung disease such as nonspecific interstitial pneumonia (NSIP). Repeat high-resolution chest CT in 1 year is suggested to assess for temporal changes in the appearance of the lung parenchyma. 2. There are multiple noncalcified pleural plaques bilaterally. No calcified pleural plaques are noted. This is nonspecific, and could relate to prior episodes of infection, or could be seen in the setting of very early asbestos related pleural disease. Clinical correlation for history of asbestos exposure is suggested. If there is indeed a history of asbestos exposure, the findings in the lung parenchyma could represent early changes of asbestosis. 3. No suspicious appearing pulmonary nodules or masses to suggest metastatic disease. 4. Very mild air trapping indicative of mild small airways disease. 5. Atherosclerosis, including left main and 3 vessel coronary artery disease. Please note that although the presence of coronary artery calcium documents the presence of coronary artery disease, the severity of this disease and any potential stenosis cannot be assessed on this non-gated CT examination. Assessment for potential risk factor modification, dietary therapy or pharmacologic therapy may be  warranted, if clinically indicated. 6. Additional incidental findings, as above.   Electronically Signed   By: Vinnie Langton M.D.   On: 04/20/2014 09:41   CLINICAL DATA: Interstitial lung disease  EXAM: PORTABLE CHEST - 1 VIEW  COMPARISON: Yesterday  FINDINGS: Stable  left chest tube. No pneumothorax. Very low volumes. Bilateral airspace disease is improved on the left but worse on the right. Cardiomegaly persists.  IMPRESSION: Bilateral airspace disease improved on the left and worse on the right  Stable chest tube. Pneumothorax resolved on the left.   Electronically Signed  By: Maryclare Bean M.D.  On: 05/19/2014 08:03    Discharge Medications:   Medication List    STOP taking these medications        fluticasone 220 MCG/ACT inhaler  Commonly known as:  FLOVENT HFA      TAKE these medications        albuterol 108 (90 BASE) MCG/ACT inhaler  Commonly known as:  PROVENTIL HFA;VENTOLIN HFA  Inhale 2 puffs into the lungs every 6 (six) hours as needed for wheezing or shortness of breath.     albuterol (2.5 MG/3ML) 0.083% nebulizer solution  Commonly known as:  PROVENTIL  Take 3 mLs (2.5 mg total) by nebulization every 3 (three) hours as needed for wheezing or shortness of breath.     alfuzosin 10 MG 24 hr tablet  Commonly known as:  UROXATRAL  Take 10 mg by mouth daily with breakfast.     aspirin 81 MG tablet  Take 81 mg by mouth daily.     budesonide-formoterol 160-4.5 MCG/ACT inhaler  Commonly known as:  SYMBICORT  Inhale 2 puffs into the lungs 2 (two) times daily.     citalopram 20 MG tablet  Commonly known as:  CELEXA  Take 20 mg by mouth daily.     clopidogrel 75 MG tablet  Commonly known as:  PLAVIX  Take 75 mg by mouth daily.     diltiazem 240 MG 24 hr capsule  Commonly known as:  CARDIZEM CD  Take 240 mg by mouth every morning.     FISH OIL + D3 PO  Take 1 capsule by mouth 4 (four) times daily.     FLUoxetine 10 MG capsule  Commonly known as:  PROZAC  Take 10 mg by mouth every morning.     furosemide 40 MG tablet  Commonly known as:  LASIX  Take 1 tablet (40 mg total) by mouth daily.     gabapentin 300 MG capsule  Commonly known as:  NEURONTIN  Take 300 mg by mouth at bedtime.     glipiZIDE 10 MG  tablet  Commonly known as:  GLUCOTROL  Take 10 mg by mouth 2 (two) times daily.     ipratropium-albuterol 0.5-2.5 (3) MG/3ML Soln  Commonly known as:  DUONEB  Take 3 mLs by nebulization every 6 (six) hours as needed (Shortness of breath).     isosorbide mononitrate 60 MG 24 hr tablet  Commonly known as:  IMDUR  Take 60 mg by mouth every morning.     nitroGLYCERIN 0.4 MG SL tablet  Commonly known as:  NITROSTAT  Place 1 tablet (0.4 mg total) under the tongue every 5 (five) minutes as needed for chest pain.     oxyCODONE 5 MG immediate release tablet  Commonly known as:  Oxy IR/ROXICODONE  Take 1-2 tablets (5-10 mg total) by mouth every 4 (four) hours as needed for severe pain.     ranitidine 300 MG  tablet  Commonly known as:  ZANTAC  Take 300 mg by mouth every morning.     rosuvastatin 20 MG tablet  Commonly known as:  CRESTOR  Take 20 mg by mouth daily.     solifenacin 5 MG tablet  Commonly known as:  VESICARE  Take 5 mg by mouth daily.     tamsulosin 0.4 MG Caps capsule  Commonly known as:  FLOMAX  Take 1 capsule (0.4 mg total) by mouth daily after supper.         Follow Up Appointments: Follow-up Information    Follow up with [name], MD On 06/05/2014.   Specialty:  Cardiothoracic Surgery   Why:  PA/LAT CXR to be taken (at [facility] which is in the same building as Dr. Leonarda Salon office) on 06/05/2014 at 3:00 pm;Appoinntment with Dr. Roxan Hockey is at 4:00 pm   Contact information:   [place] [address] [phone]       Follow up with [place], NP On 06/07/2014.   Specialty:  Nurse Practitioner   Why:  3:30 pm   Contact information:   [address] [place] [address] [phone]       Follow up with [facility].   Why:  For suture removal - office will contact you with an appointment      Signed: COLLINS,GINA HPA-C 05/25/2014, 7:55 AM

## 2014-05-18 NOTE — H&P (View-Only) (Signed)
PCP is [name], [name], MD Referring Provider is Brand Males, MD  Chief Complaint  Patient presents with  . NEW THORACIC    EVAL FOR LUNG BIOPSY    HPI: A 58 year old man sent for consultation regarding a lung biopsy for suspected interstitial lung disease.  Mr. Smola is a 58 year old man with a common Medical history. His past history includes coronary artery disease with a stent to his mid LAD in August 2014 he was treated with Plavix for that until August of this year. It has since been discontinued. He also has a history of polysubstance abuse including tobacco and cocaine, medical noncompliance, poorly controlled type 2 diabetes, congestive heart failure, hypertension, hyperlipidemia, and likely obstructive sleep apnea.  In June he had a cholecystectomy. He had a laparoscopic right nephrectomy for renal cell cancer in August. His postoperative course was complicated by hypoxic respiratory failure and delirium. He was discharged on home oxygen and remains on oxygen since that time. He currently is using 4 L nasal cannula.  Complains of shortness of breath with exertion. He continues to smoke 0.5 packs of cigarettes daily as he has for the past 40 years. He is not interested in quitting. He has not had any anginal type chest pain. He had a catheterization this summer which showed his stent in the LAD was patent and there was no hemodynamically significant stenoses in the circumflex or right coronary. He does complain of reflux and heartburn. He also suffers from anxiety and depression.  He recently had a CT which suggested mild early interstitial lung disease.   Past Medical History  Diagnosis Date  . Essential hypertension, benign   . TIA (transient ischemic attack) 1997    No residual neurological deficits.  . Noncompliance   . COPD (chronic obstructive pulmonary disease)   . Hyperlipidemia   . History of pneumonia   . Type 2 diabetes mellitus 2011  . GERD (gastroesophageal  reflux disease)   . Substance abuse   . Cocaine abuse     02-14-14 dicusses freely with medical team, prefers no discussions with family present.  . Renal cell carcinoma     Status post radical nephrectomy August 2015  . Cholecystitis, acute 12/20/2013    Lap chole 6/5  . Coronary atherosclerosis of native coronary artery     a. 03/09/2013 Cath/PCI: LM nl, LAD: 50p, 39m (2.5x16 promus DES), LCX nl, OM1 min irregs, LPL/LPDA diff dzs, RCA nondom, mod diff dzs, EF 55%.  . Depression   . Anxiety   . History of nephrolithiasis   . Arthritis   . Hepatitis Late 1970s    Past Surgical History  Procedure Laterality Date  . Appendectomy  1970's  . Cholecystectomy N/A 12/22/2013    Procedure: LAPAROSCOPIC CHOLECYSTECTOMY ;  Surgeon: [name], MD;  Location: [facility];  Service: General;  Laterality: N/A;  . Robot assisted laparoscopic nephrectomy Right 02/21/2014    Procedure: ROBOTIC ASSISTED LAPAROSCOPIC RIGHT NEPHRECTOMY ;  Surgeon: [name], MD;  Location: [facility];  Service: Urology;  Laterality: Right;  . Laparoscopic lysis of adhesions  02/21/2014    Procedure: LAPAROSCOPIC LYSIS OF ADHESIONS EXTINSIVE;  Surgeon: [name], MD;  Location: [facility];  Service: Urology;;    Family History  Problem Relation Age of Onset  . Hypertension    . Diabetes    . Stroke    . Lung cancer Father   . Cancer Mother     Thyroid - living in her 41's.  [name]  Cancer Maternal Grandmother     Breast  . Cancer Maternal Grandfather     Throat and stomach  . CAD Father   . CAD Brother     Social History History  Substance Use Topics  . Smoking status: Current Every Day Smoker -- 0.50 packs/day for 41 years    Types: Cigarettes    Start date: 05/03/1972  . Smokeless tobacco: Never Used  . Alcohol Use: No    Current Outpatient Prescriptions  Medication Sig Dispense Refill  . alfuzosin (UROXATRAL) 10 MG 24 hr tablet Take 10 mg by mouth daily with breakfast.      . atorvastatin (LIPITOR) 20 MG  tablet Take 20 mg by mouth daily.      . citalopram (CELEXA) 40 MG tablet Take 40 mg by mouth every morning.      . diltiazem (CARDIZEM CD) 240 MG 24 hr capsule Take 240 mg by mouth every morning.      [name] FLUoxetine (PROZAC) 10 MG capsule Take 10 mg by mouth every morning.      . fluticasone (FLOVENT HFA) 220 MCG/ACT inhaler Inhale 1 puff into the lungs 2 (two) times daily.  1 Inhaler  12  . furosemide (LASIX) 40 MG tablet Take 1 tablet (40 mg total) by mouth daily.  30 tablet  0  . gabapentin (NEURONTIN) 300 MG capsule Take 300 mg by mouth at bedtime.      [name] glipiZIDE (GLUCOTROL) 10 MG tablet Take 10 mg by mouth 2 (two) times daily.      [name] ipratropium-albuterol (DUONEB) 0.5-2.5 (3) MG/3ML SOLN Take 3 mLs by nebulization every 6 (six) hours as needed (Shortness of breath).      . isosorbide mononitrate (IMDUR) 60 MG 24 hr tablet Take 60 mg by mouth every morning.       . ranitidine (ZANTAC) 300 MG tablet Take 300 mg by mouth every morning.       . solifenacin (VESICARE) 5 MG tablet Take 5 mg by mouth daily.      [name] albuterol (PROVENTIL HFA;VENTOLIN HFA) 108 (90 BASE) MCG/ACT inhaler Inhale 2 puffs into the lungs every 6 (six) hours as needed for wheezing or shortness of breath.      [name] albuterol (PROVENTIL) (2.5 MG/3ML) 0.083% nebulizer solution Take 3 mLs (2.5 mg total) by nebulization every 3 (three) hours as needed for wheezing or shortness of breath.  75 mL  12  . nitroGLYCERIN (NITROSTAT) 0.4 MG SL tablet Place 1 tablet (0.4 mg total) under the tongue every 5 (five) minutes as needed for chest pain.  30 tablet  1   No current facility-administered medications for this visit.    No Known Allergies  Review of Systems  Constitutional: Positive for fatigue and unexpected weight change (weight loss- 13 pounds due to 2 recent surgeries). Negative for fever and chills.  Respiratory: Positive for apnea, cough, chest tightness, shortness of breath and wheezing.        4L [place] home O2  Cardiovascular:  Positive for leg swelling. Negative for chest pain.       Orthopnea  Gastrointestinal:       Reflux, heartburn  Genitourinary: Positive for frequency.       BPH  Musculoskeletal: Positive for arthralgias.  Neurological: Positive for dizziness.  Hematological: Negative for adenopathy. Bruises/bleeds easily.  Psychiatric/Behavioral: Positive for dysphoric mood. The patient is nervous/anxious.        Delirium postop  All other systems reviewed and are negative.   BP  120/77  Pulse 97  Ht 5\' 9"  (1.753 m)  Wt 236 lb (107.049 kg)  BMI 34.84 kg/m2  SpO2 94% Physical Exam  Vitals reviewed. Constitutional: He is oriented to person, place, and time. No distress.  overweight  HENT:  Head: Normocephalic and atraumatic.  Eyes: EOM are normal. Pupils are equal, round, and reactive to light.  Neck: Neck supple. No JVD present. No thyromegaly present.  Cardiovascular: Normal rate, regular rhythm and normal heart sounds.   No murmur heard. Pulmonary/Chest: Effort normal and breath sounds normal. He has no wheezes. He has no rales.  Abdominal: Soft. There is no tenderness.  Musculoskeletal: He exhibits no edema.  Lymphadenopathy:    He has no cervical adenopathy.  Neurological: He is alert and oriented to person, place, and time. No cranial nerve deficit.  No focal motor deficit  Skin: Skin is warm and dry.     Diagnostic Tests:  CT CHEST WITHOUT CONTRAST  TECHNIQUE:  Multidetector CT imaging of the chest was performed following the  standard protocol without intravenous contrast. High resolution  imaging of the lungs, as well as inspiratory and expiratory imaging,  was performed.  COMPARISON: No priors.  FINDINGS:  Mediastinum: Heart size is normal. There is no significant  pericardial fluid, thickening or pericardial calcification. There is  atherosclerosis of the thoracic aorta, the great vessels of the  mediastinum and the coronary arteries, including calcified   atherosclerotic plaque in the left main, left anterior descending,  left circumflex and right coronary arteries. No pathologically  enlarged mediastinal or hilar lymph nodes. Please note that accurate  exclusion of hilar adenopathy is limited on noncontrast CT scans.  Esophagus is unremarkable in appearance.  Lungs/Pleura: There are subtle noncalcified pleural plaques  throughout the thorax bilaterally. No definite calcified pleural  plaques are identified at this time. Mild diffuse bronchial wall  thickening with mild centrilobular and paraseptal emphysema, most  evident the lung apices bilaterally. In addition, there are patchy  areas of peripheral and peribronchovascular ground-glass attenuation  which are very mild, with some scattered areas of very mild  subpleural reticulation. No traction bronchiectasis or frank  honeycombing noted at this time. Inspiratory and expiratory images  demonstrate minimal air trapping in the lungs bilaterally. No  suspicious appearing pulmonary nodules or masses. No acute  consolidative airspace disease. No pleural effusions.  Upper Abdomen: 4.1 cm well-defined low-attenuation lesion in the  left lobe of the liver is incompletely characterized on today's  noncontrast CT examination, but was previously characterized as a  septated cyst on ultrasound examination 12/27/2013.  Musculoskeletal: There are no aggressive appearing lytic or blastic  lesions noted in the visualized portions of the skeleton.  IMPRESSION:  1. There are patchy areas of very mild ground-glass attenuation and  subpleural reticulation in the lungs bilaterally, which could  indicate interstitial lung disease such as nonspecific interstitial  pneumonia (NSIP). Repeat high-resolution chest CT in 1 year is  suggested to assess for temporal changes in the appearance of the  lung parenchyma.  2. There are multiple noncalcified pleural plaques bilaterally. No  calcified pleural plaques  are noted. This is nonspecific, and could  relate to prior episodes of infection, or could be seen in the  setting of very early asbestos related pleural disease. Clinical  correlation for history of asbestos exposure is suggested. If there  is indeed a history of asbestos exposure, the findings in the lung  parenchyma could represent early changes of asbestosis.  3. No suspicious  appearing pulmonary nodules or masses to suggest  metastatic disease.  4. Very mild air trapping indicative of mild small airways disease.  5. Atherosclerosis, including left main and 3 vessel coronary artery  disease. Please note that although the presence of coronary artery  calcium documents the presence of coronary artery disease, the  severity of this disease and any potential stenosis cannot be  assessed on this non-gated CT examination. Assessment for potential  risk factor modification, dietary therapy or pharmacologic therapy  may be warranted, if clinically indicated.  6. Additional incidental findings, as above.  Electronically Signed  By: Vinnie Langton M.D.  On: 04/20/2014 09:41   Pulmonary function testing FVC 4.37 (90%) FEV1 3.58 (97%) FEV1/FVC 0.82 TLC 6.11 (81%) DLCO 17.92 (55%)  Impression: 58 year old man with a complex medical history, including 2 recent surgeries over the past several months, who likely has early interstitial lung disease. He is on oxygen at 4 L nasal cannula at home. Interestingly he came into the office without his oxygen and his saturation was 94%. His diffusion capacity is approximately 50% of normal by PFTs. Dr. Chase Caller wishes to have a lung biopsy for diagnostic purposes.  I discussed the general nature of the procedure, left VATS with lung biopsy, with Mr. Wadel. We discussed the need for general anesthesia, the incisions to be used, the expected hospital stay, and overall recovery. He understands that this is strictly a diagnostic procedure and has no  therapeutic benefit. I reviewed the indications, risks, benefits, and alternatives. He understands the risks include,  but are not limited to death, stroke, MI, DVT/PE, bleeding, possible need for transfusion, infections, prolonged air leak, cardiac arrhythmias, and other unforeseeable complications. He understands the biopsies may not yield a definitive diagnosis.  He wishes to proceed with lung biopsy as soon as possible.   He has been cleared for surgery from a cardiac standpoint by Dr. Domenic Polite.  Plan: Left VATS, lung biopsy on Friday, October 30

## 2014-05-18 NOTE — Anesthesia Procedure Notes (Addendum)
Procedure Name: Intubation Date/Time: 05/18/2014 8:59 AM Performed by: Neldon Newport Pre-anesthesia Checklist: Patient identified, Timeout performed, Emergency Drugs available, Suction available and Patient being monitored Patient Re-evaluated:Patient Re-evaluated prior to inductionOxygen Delivery Method: Circle system utilized Preoxygenation: Pre-oxygenation with 100% oxygen Intubation Type: IV induction Ventilation: Mask ventilation without difficulty Laryngoscope Size: Mac and 3 Grade View: Grade I Tube type: Oral Tube size: 8.5 mm Number of attempts: 1 Placement Confirmation: positive ETCO2,  ETT inserted through vocal cords under direct vision and breath sounds checked- equal and bilateral Tube secured with: Tape Dental Injury: Teeth and Oropharynx as per pre-operative assessment    Procedure Name: Intubation Date/Time: 05/18/2014 9:45 AM Performed by: Neldon Newport Pre-anesthesia Checklist: Patient identified, Timeout performed, Emergency Drugs available, Patient being monitored and Suction available Patient Re-evaluated:Patient Re-evaluated prior to inductionOxygen Delivery Method: Circle system utilized Preoxygenation: Pre-oxygenation with 100% oxygen (Single lumen ETT removed.) Laryngoscope Size: Mac and 4 Grade View: Grade I Endobronchial tube: Left, Double lumen EBT, EBT position confirmed by auscultation and EBT position confirmed by fiberoptic bronchoscope and 39 Fr Number of attempts: 1 Airway Equipment and Method: Fiberoptic brochoscope Placement Confirmation: positive ETCO2,  ETT inserted through vocal cords under direct vision and breath sounds checked- equal and bilateral Tube secured with: Tape Dental Injury: Teeth and Oropharynx as per pre-operative assessment

## 2014-05-18 NOTE — OR Nursing (Addendum)
Specimen ,Left upper lobe & Left lower lobe Bronchial wash/lavage & left  Upper & left lower lobe transbronchial biopsy (video bronchoscopy procedure ) & left upper & lower lobe biopsy (Left VATS procedure )for [employer] research study was  taken by research team.

## 2014-05-18 NOTE — Transfer of Care (Signed)
Immediate Anesthesia Transfer of Care Note  Patient: [name]  Procedure(s) Performed: Procedure(s): LEFT VIDEO ASSISTED THORACOSCOPY (Left) LUNG BIOPSY left upper lobe & left lower lobe (Left) VIDEO BRONCHOSCOPY  Patient Location: PACU  Anesthesia Type:General  Level of Consciousness: awake, alert  and oriented  Airway & Oxygen Therapy: Patient Spontanous Breathing and Patient connected to face mask oxygen  Post-op Assessment: Report given to PACU RN, Post -op Vital signs reviewed and stable and Patient moving all extremities X 4  Post vital signs: Reviewed and stable  Complications: No apparent anesthesia complications

## 2014-05-18 NOTE — Progress Notes (Signed)
Utilization review completed.  

## 2014-05-18 NOTE — Discharge Instructions (Signed)
Thoracotomy, Care After °Refer to this sheet in the next few weeks. These instructions provide you with information on caring for yourself after your procedure. Your health care provider may also give you more specific instructions. Your treatment has been planned according to current medical practices, but problems sometimes occur. Call your health care provider if you have any problems or questions after your procedure. °WHAT TO EXPECT AFTER YOUR PROCEDURE °After your procedure, it is typical to have the following sensations: °· You may feel pain at the incision site. °· You may be constipated from the pain medicine given and the change in your level of activity. °· You may feel extremely tired. °HOME CARE INSTRUCTIONS °· Take over-the-counter or prescription medicines for pain, discomfort, or fever only as directed by your health care provider. It is very important to take pain relieving medicine before your pain becomes severe. You will be able to breathe and cough more comfortably if your pain is well controlled. °· Take deep breaths. Deep breathing helps to keep your lungs inflated and protects against a lung infection (pneumonia). °· Cough frequently. Even though coughing may cause discomfort, coughing is important to clear mucus (phlegm) and expand your lungs. Coughing helps prevent pneumonia. If it hurts to cough, hold a pillow against your chest when you cough. This may help with the discomfort. °· Continue to use an incentive spirometer as directed. The use of an incentive spirometer helps to keep your lungs inflated and protects against pneumonia. °· Change the bandages over your incision as needed or as directed by your health care provider. °· Remove the bandages over your chest tube site as directed by your health care provider. °· Resume your normal diet as directed. It is important to have adequate protein, calories, vitamins, and minerals to promote healing. °· Prevent constipation. °¨ Eat  high-fiber foods such as whole grain cereals and breads, brown rice, beans, and fresh fruits and vegetables. °¨ Drink enough water and fluids to keep your urine clear or pale yellow. Avoid drinking beverages containing caffeine. Beverages containing caffeine can cause dehydration and harden your stool. °¨ Talk to your health care provider about taking a stool softener or laxative. °· Avoid lifting until you are instructed otherwise. °· Do not drive until directed by your health care provider.  Do not drive while taking pain medicines (narcotics). °· Do not bathe, swim, or use a hot tub until directed by your health care provider. You may shower instead. Gently wash the area of your incision with water and soap as directed. Do not use anything else to clean your incision except as directed by your health care provider. °· Do not use any tobacco products including cigarettes, chewing tobacco, or electronic cigarettes. °· Avoid secondhand smoke. °· Schedule an appointment for stitch (suture) or staple removal as directed. °· Schedule and attend all follow-up visits as directed by your health care provider. It is important to keep all your appointments. °· Participate in pulmonary rehabilitation as directed by your health care provider. °· Do not travel by airplane for 2 weeks after your chest tube is removed. °SEEK MEDICAL CARE IF: °· You are bleeding from your wounds. °· Your heartbeat seems irregular. °· You have redness, swelling, or increasing pain in the wounds. °· There is pus coming from your wounds. °· There is a bad smell coming from the wound or dressing. °· You have a fever or chills. °· You have nausea or are vomiting. °· You have muscle aches. °SEEK   IMMEDIATE MEDICAL CARE IF: °· You have a rash. °· You have difficulty breathing. °· You have a reaction or side effect to medicines given. °· You have persistent nausea. °· You have lightheadedness or feel faint. °· You have shortness of breath or chest  pain. °· You have persistent pain. °Document Released: 12/19/2010 Document Revised: 07/11/2013 Document Reviewed: 02/22/2013 °[employer]® Patient Information ©2015 [employer]. This information is not intended to replace advice given to you by your health care provider. Make sure you discuss any questions you have with your health care provider. ° °

## 2014-05-18 NOTE — Interval H&P Note (Signed)
History and Physical Interval Note:  [name] has chosen to participate in a clinical trial involving bronchoscopy with BAL and transbronchial biopsy. He is aware of the risks. 05/18/2014 8:42 AM  [name]  has presented today for surgery, with the diagnosis of Interstitial Lung Disease  The various methods of treatment have been discussed with the patient and family. After consideration of risks, benefits and other options for treatment, the patient has consented to  Procedure(s): VIDEO ASSISTED THORACOSCOPY (Left) LUNG BIOPSY (Left) VIDEO BRONCHOSCOPY as a surgical intervention .  The patient's history has been reviewed, patient examined, no change in status, stable for surgery.  I have reviewed the patient's chart and labs.  Questions were answered to the patient's satisfaction.     [name]

## 2014-05-18 NOTE — Brief Op Note (Addendum)
05/18/2014  11:04 AM  PATIENT:  [name]  58 y.o. male  PRE-OPERATIVE DIAGNOSIS:  Possible Interstitial Lung Disease  POST-OPERATIVE DIAGNOSIS:  Possible Interstitial Lung Disease  PROCEDURE:   VIDEO BRONCHOSCOPY with BAL and transbronchial biopsies LEFT VIDEO ASSISTED THORACOSCOPY   Left upper lobe & left lower lobe biopsies x 4   SURGEON:  [name], MD  ASSISTANT: Suzzanne Cloud, PA-C  ANESTHESIA:   general  SPECIMEN:  Source of Specimen:  left upper lobe and left lower lobe  DISPOSITION OF SPECIMEN:  Pathology  DRAINS: 28 Fr CT  PATIENT CONDITION:  PACU - hemodynamically stable.   FINDINGS- diffuse anthracosis of lungs. Tissue edematous. Frozen showed some mild fibrosis.

## 2014-05-18 NOTE — Anesthesia Postprocedure Evaluation (Signed)
  Anesthesia Post-op Note  Patient: [name]  Procedure(s) Performed: Procedure(s): LEFT VIDEO ASSISTED THORACOSCOPY (Left) LUNG BIOPSY left upper lobe & left lower lobe (Left) VIDEO BRONCHOSCOPY  Patient Location: PACU  Anesthesia Type:General  Level of Consciousness: awake, alert  and oriented  Airway and Oxygen Therapy: Patient Spontanous Breathing  Post-op Pain: none  Post-op Assessment: Post-op Vital signs reviewed  Post-op Vital Signs: Reviewed  Last Vitals:  Filed Vitals:   05/18/14 1353  BP:   Pulse: 68  Temp:   Resp: 21    Complications: No apparent anesthesia complications

## 2014-05-19 ENCOUNTER — Inpatient Hospital Stay (HOSPITAL_COMMUNITY): Payer: Medicaid Other

## 2014-05-19 LAB — CBC
HEMATOCRIT: 40.7 % (ref 39.0–52.0)
HEMOGLOBIN: 13 g/dL (ref 13.0–17.0)
MCH: 27.7 pg (ref 26.0–34.0)
MCHC: 31.9 g/dL (ref 30.0–36.0)
MCV: 86.6 fL (ref 78.0–100.0)
Platelets: 212 10*3/uL (ref 150–400)
RBC: 4.7 MIL/uL (ref 4.22–5.81)
RDW: 14.7 % (ref 11.5–15.5)
WBC: 13 10*3/uL — ABNORMAL HIGH (ref 4.0–10.5)

## 2014-05-19 LAB — BASIC METABOLIC PANEL
ANION GAP: 12 (ref 5–15)
BUN: 18 mg/dL (ref 6–23)
CHLORIDE: 100 meq/L (ref 96–112)
CO2: 24 mEq/L (ref 19–32)
Calcium: 8.6 mg/dL (ref 8.4–10.5)
Creatinine, Ser: 1.5 mg/dL — ABNORMAL HIGH (ref 0.50–1.35)
GFR, EST AFRICAN AMERICAN: 58 mL/min — AB (ref >90)
GFR, EST NON AFRICAN AMERICAN: 50 mL/min — AB (ref >90)
Glucose, Bld: 143 mg/dL — ABNORMAL HIGH (ref 70–99)
POTASSIUM: 4.2 meq/L (ref 3.7–5.3)
Sodium: 136 mEq/L — ABNORMAL LOW (ref 137–147)

## 2014-05-19 LAB — BLOOD GAS, ARTERIAL
Acid-base deficit: 0.5 mmol/L (ref 0.0–2.0)
BICARBONATE: 24.7 meq/L — AB (ref 20.0–24.0)
DRAWN BY: 41881
O2 CONTENT: 15 L/min
O2 Saturation: 88.2 %
PCO2 ART: 48.8 mmHg — AB (ref 35.0–45.0)
PH ART: 7.326 — AB (ref 7.350–7.450)
PO2 ART: 57.1 mmHg — AB (ref 80.0–100.0)
Patient temperature: 98.6
TCO2: 26.2 mmol/L (ref 0–100)

## 2014-05-19 LAB — PRO B NATRIURETIC PEPTIDE: Pro B Natriuretic peptide (BNP): 598.8 pg/mL — ABNORMAL HIGH (ref 0–125)

## 2014-05-19 NOTE — Progress Notes (Signed)
Unable to keep pt's O2 sats in mid to upper 80s on venturi mask @ 55%. Placed pt back on NRB mask. Lars Pinks PA aware.

## 2014-05-19 NOTE — Op Note (Signed)
NAMECYRUSS, ARATA                ACCOUNT NO.:  [id number]  MEDICAL RECORD NO.:  [id number]  LOCATION:  3S13C                        FACILITY:  [facility]  PHYSICIAN:  Revonda Standard. Roxan Hockey, M.D.DATE OF BIRTH:  [birth year]  DATE OF PROCEDURE:  05/18/2014 DATE OF DISCHARGE:                              OPERATIVE REPORT   PREOPERATIVE DIAGNOSIS:  Possible interstitial lung disease.  POSTOPERATIVE DIAGNOSIS:  Possible interstitial lung disease.  PROCEDURES:  Bronchoscopy with bronchoalveolar lavage and transbronchial biopsies, left video-assisted thoracoscopy, lung biopsy from left upper and lower lobes.  SURGEON:  Revonda Standard. Roxan Hockey, M.D.  ASSISTANT:  Suzzanne Cloud, P.A.  ANESTHESIA:  General.  FINDINGS:  Bronchoscopy showed normal endobronchial anatomy with no endobronchial lesions.  Thoracoscopy showed diffusely anthracotic lung.  Frozen section revealed mild fibrosis, no definite interstitial disease. Lung tissue edematous.  CLINICAL NOTE:  Mr. Mckamie is a 58 year old gentleman with a complicated medical history, who is home oxygen dependent.  He had a CT recently, which suggested possible early interstitial lung disease and was advised to have a lung biopsy.  The patient understood that the CT changes were mild and there might not be significant pathology.  The indications, risks, benefits, and alternatives were discussed in detail with the patient.  He understood and accepted the risks and agreed to proceed.  OPERATIVE NOTE:  Mr. Fuhrmann was brought to the preoperative holding area on May 18, 2014, there, Anesthesia established intravenous access and placed an arterial blood pressure monitoring line.  He was taken to the operating room, anesthetized, and intubated with a single-lumen endotracheal tube.  Intravenous antibiotics were administered.  A Foley catheter was placed.  Sequential compressive devices were placed on the calves for DVT prophylaxis.  Flexible  fiberoptic bronchoscopy was performed for the clinical study, to which the patient had consented.  It revealed normal endobronchial anatomy and no endobronchial lesions to the level of the subsegmental bronchi.  Bronchoalveolar lavage was performed of the left upper lobe. A total of 200 mL of saline was instilled and approximately 50 mL was withdrawn.  Next, BAL was performed on the lower lobe, and approximately 150 mL of saline were instilled and again approximately 50 mL withdrawn.  Transbronchial biopsies then were performed with fluoroscopic guidance from the posterior aspect of the left upper lobe. Two biopsies were taken and then three biopsies were taken from the basilar portion of the left lower lobe.  There was mild bleeding after the biopsies.  The bronchoscope was withdrawn.  The patient was reintubated with a double-lumen endotracheal tube.  He then was placed in a right lateral decubitus position, and the left chest was prepped and draped in usual sterile fashion.  Single lung ventilation of the right lung was initiated.  An incision was made in the seventh intercostal space in the midaxillary line. It was carried through the skin and subcutaneous tissue.  The chest was entered bluntly with a 5-mm port and thoracoscope was advanced in the chest.  It was clear that the lung was not isolated.  The double-lumen endotracheal tube was readjusted and advanced into the left mainstem and then there was good isolation of the left lung. The patient tolerated single  lung ventilation well.  A small working incision was made in the fifth intercostal space anteriorly. No rib spreading was performed.  The lateral aspect of the left lower lobe was inspected.  The lung was diffusely anthracotic over its entire lateral surface.  There was less anthracosis visible in the fissures.  The lung was relatively uniform in appearance and there were no areas that appeared more diseased than others.   There also were no normal-appearing areas of the lung.  An area along the inferolateral margin of the left lower lobe was identified and removed with sequential firings of endoscopic GIA stapler.  The specimen was divided. A 1 cm piece was sent for the study.  A small piece was sent for AFB and fungal cultures.  The tissue was noted to be edematous. The remainder of the tissue was sent for frozen section, which subsequently returned showing some mild fibrosis, but no definite interstitial disease.  While awaiting the frozen section results, a biopsy was taken from the posterior basilar aspect of the left lower lobe.  Next, an area of the posterior aspect of the left upper lobe was biopsied as well as a portion more laterally. These were areas that appeared on CT to have more involvement than other areas of the upper lobe.  All of the biopsies were performed with sequential firings of endoscopic GIA staplers. There was good hemostasis of the staple lines.  A 28-French chest tube was placed through the original port incision and secured with #1 silk suture.  The left lung was reinflated.  The working incision was closed with #1 Vicryl fascial suture followed by 2-0 Vicryl subcutaneous suture and 3-0 Vicryl subcuticular suture.  All sponge, needle, and instrument counts were correct at the end of the procedure.  The patient was taken from the operating room to the postanesthetic care unit in good condition.     Revonda Standard Roxan Hockey, M.D.     SCH/MEDQ  D:  05/18/2014  T:  05/19/2014  Job:  [id number]

## 2014-05-19 NOTE — Progress Notes (Signed)
20 mcg of fentanyl wasted in sink. Witnessed by Cherly Beach RN.

## 2014-05-19 NOTE — Progress Notes (Addendum)
      [address]       [place] [address]             [phone]       1 Day Post-Op Procedure(s) (LRB): LEFT VIDEO ASSISTED THORACOSCOPY (Left) LUNG BIOPSY left upper lobe & left lower lobe (Left) VIDEO BRONCHOSCOPY  Subjective: Patient awake and alert. Was on Venti mask and now on NRB  Objective: Vital signs in last 24 hours: Temp:  [97 F (36.1 C)-98.7 F (37.1 C)] 98.7 F (37.1 C) (10/31 0700) Pulse Rate:  [68-90] 78 (10/31 0410) Cardiac Rhythm:  [-] Normal sinus rhythm (10/31 0410) Resp:  [17-42] 21 (10/31 0410) BP: (110-161)/(53-73) 111/62 mmHg (10/31 0410) SpO2:  [84 %-100 %] 96 % (10/31 0410) Arterial Line BP: (87-185)/(54-69) 87/69 mmHg (10/31 0410) FiO2 (%):  [30 %-91 %] 30 % (10/31 0000)     Intake/Output from previous day: 10/30 0701 - 10/31 0700 In: 2200 [I.V.:2200] Out: 3075 [Urine:2825; Blood:25; Chest Tube:225]   Physical Exam:  Cardiovascular: RRR Pulmonary: Coarse breath sounds Abdomen: Soft, non tender, bowel sounds present. Extremities: SCDs in place Wounds: Dressing is clean and dry.  Chest Tube: to suction, no air leak  Lab Results: CBC: Recent Labs  05/17/14 1519 05/19/14 0420  WBC 8.9 13.0*  HGB 13.2 13.0  HCT 40.6 40.7  PLT 206 212   BMET:  Recent Labs  05/17/14 1519 05/19/14 0420  NA 135* 136*  K 3.8 4.2  CL 97 100  CO2 22 24  GLUCOSE 129* 143*  BUN 23 18  CREATININE 1.82* 1.50*  CALCIUM 9.2 8.6    PT/INR:  Recent Labs  05/17/14 1519  LABPROT 13.7  INR 1.03   ABG:  INR: Will add last result for INR, ABG once components are confirmed Will add last 4 CBG results once components are confirmed  Assessment/Plan:  1. CV - SR in the 80's. On Cardizem CD 240 daily, Imdur 60 daily as taken pre op. 2.  Pulmonary - Previously on Venti mask but now on NRB. Was on 4 liters of oxygen via [place] prior to surgery.Chest tube with 225 cc of output since surgery. There is no air leak this am. CXR shows low  lung volumes, no pneumothorax, and cardiomegaly. Encourage incentive spirometer 3. Creatinine down to 1.5 4. DM-CBGs  133/149/15 . On Glipizide 10 bid as taken pre op. 5. Foley to remain until am but will remove a line  ZIMMERMAN,DONIELLE MPA-C 05/19/2014,8:11 AM   Chart reviewed, patient examined, agree with above. His CXR looks better today. There is no air leak. Will put to water seal. Work on IS Wean oxygen. Accept sats 88%.

## 2014-05-20 ENCOUNTER — Inpatient Hospital Stay (HOSPITAL_COMMUNITY): Payer: Medicaid Other

## 2014-05-20 LAB — COMPREHENSIVE METABOLIC PANEL
ALT: 16 U/L (ref 0–53)
ANION GAP: 12 (ref 5–15)
AST: 15 U/L (ref 0–37)
Albumin: 2.7 g/dL — ABNORMAL LOW (ref 3.5–5.2)
Alkaline Phosphatase: 71 U/L (ref 39–117)
BILIRUBIN TOTAL: 0.4 mg/dL (ref 0.3–1.2)
BUN: 13 mg/dL (ref 6–23)
CHLORIDE: 100 meq/L (ref 96–112)
CO2: 24 mEq/L (ref 19–32)
Calcium: 8.7 mg/dL (ref 8.4–10.5)
Creatinine, Ser: 1.37 mg/dL — ABNORMAL HIGH (ref 0.50–1.35)
GFR calc Af Amer: 64 mL/min — ABNORMAL LOW (ref >90)
GFR calc non Af Amer: 55 mL/min — ABNORMAL LOW (ref >90)
Glucose, Bld: 117 mg/dL — ABNORMAL HIGH (ref 70–99)
Potassium: 3.9 mEq/L (ref 3.7–5.3)
Sodium: 136 mEq/L — ABNORMAL LOW (ref 137–147)
TOTAL PROTEIN: 6.3 g/dL (ref 6.0–8.3)

## 2014-05-20 LAB — CBC
HEMATOCRIT: 38.7 % — AB (ref 39.0–52.0)
Hemoglobin: 12.6 g/dL — ABNORMAL LOW (ref 13.0–17.0)
MCH: 27.9 pg (ref 26.0–34.0)
MCHC: 32.6 g/dL (ref 30.0–36.0)
MCV: 85.8 fL (ref 78.0–100.0)
Platelets: 175 10*3/uL (ref 150–400)
RBC: 4.51 MIL/uL (ref 4.22–5.81)
RDW: 14.4 % (ref 11.5–15.5)
WBC: 11.8 10*3/uL — ABNORMAL HIGH (ref 4.0–10.5)

## 2014-05-20 LAB — WOUND CULTURE
Culture: NO GROWTH
Gram Stain: NONE SEEN

## 2014-05-20 NOTE — Evaluation (Addendum)
Physical Therapy Evaluation Patient Details Name: [name] MRN: [id number] DOB: 1955-12-04 Today's Date: 05/20/2014   History of Present Illness  Patient is a 58 yo male with complex medical history now s/p LEFT VIDEO ASSISTED THORACOSCOPY (Left) and LUNG BIOPSY left upper lobe & left lower lobe (Left).  Clinical Impression  Patient demonstrates deficits in functional mobility as indicated below. Will need continued skilled PT to address deficits and maximize function. Will see as indicated and progress as tolerated. Patient assisted OOB this session, educated extensively on pursed lip breathing, educated and performed incentive spirometer, performed multiple transfers and used BSC with assist for pericare. At this time, will monitor progress and encourage increased mobility with nsg as oxygen levels allow. BP soft initially 106/60s during EOB, but did improve with activity, O2 saturations remained upper 80s to low 90s on venti.    Follow Up Recommendations Home health PT;Supervision/Assistance - 24 hour (pending progress)    Equipment Recommendations  Rolling walker with 5" wheels    Recommendations for Other Services       Precautions / Restrictions Precautions Precautions: Fall (SOB) Restrictions Weight Bearing Restrictions: No      Mobility  Bed Mobility Overal bed mobility: Modified Independent             General bed mobility comments: increased time to perform  Transfers Overall transfer level: Needs assistance Equipment used: None Transfers: Sit to/from [facility] Sit to Stand: Min guard Stand pivot transfers: Min guard       General transfer comment: Min G for stability and management of lines, limited activity secondary to desaturation, performed transfer to chair, and bsc then back to chair  Ambulation/Gait                Stairs            Wheelchair Mobility    Modified Rankin (Stroke Patients Only)        Balance Overall balance assessment: Needs assistance Sitting-balance support: Feet supported Sitting balance-Leahy Scale: Good     Standing balance support: During functional activity Standing balance-Leahy Scale: Fair                               Pertinent Vitals/Pain Pain Assessment: No/denies pain    Home Living Family/patient expects to be discharged to:: Private residence Living Arrangements: Alone Available Help at Discharge: Family Type of Home: House Home Access: Ramped entrance     Home Layout: One level Home Equipment: None      Prior Function Level of Independence: Independent               Hand Dominance   Dominant Hand: Right    Extremity/Trunk Assessment   Upper Extremity Assessment: Generalized weakness           Lower Extremity Assessment: Generalized weakness         Communication   Communication: No difficulties  Cognition Arousal/Alertness: Awake/alert Behavior During Therapy: WFL for tasks assessed/performed Overall Cognitive Status: Within Functional Limits for tasks assessed                      General Comments      Exercises        Assessment/Plan    PT Assessment Patient needs continued PT services  PT Diagnosis Difficulty walking;Generalized weakness   PT Problem List Decreased strength;Decreased activity tolerance;Decreased balance;Decreased mobility  PT Treatment Interventions DME  instruction;Gait training;Stair training;Functional mobility training;Therapeutic activities;Therapeutic exercise;Balance training;Patient/family education   PT Goals (Current goals can be found in the Care Plan section) Acute Rehab PT Goals Patient Stated Goal: to breathe PT Goal Formulation: With patient/family Time For Goal Achievement: 06/03/14 Potential to Achieve Goals: Good    Frequency Min 3X/week   Barriers to discharge        Co-evaluation               End of Session Equipment  Utilized During Treatment: Gait belt;Oxygen (venti mask) Activity Tolerance: Patient limited by fatigue Patient left: in chair;with call bell/phone within reach;with family/visitor present Nurse Communication: Mobility status         Time: 1115-5208 PT Time Calculation (min): 28 min   Charges:   PT Evaluation $Initial PT Evaluation Tier I: 1 Procedure PT Treatments $Therapeutic Activity: 8-22 mins $Self Care/Home Management: 8-22   PT G CodesDuncan Dull 05/20/2014, 3:47 PM Alben Deeds, Brenda DPT  [phone]

## 2014-05-20 NOTE — Progress Notes (Addendum)
      [address]       [place] [address]             [phone]       2 Days Post-Op Procedure(s) (LRB): LEFT VIDEO ASSISTED THORACOSCOPY (Left) LUNG BIOPSY left upper lobe & left lower lobe (Left) VIDEO BRONCHOSCOPY  Subjective: Patient awake and alert.  Objective: Vital signs in last 24 hours: Temp:  [97.9 F (36.6 C)-99 F (37.2 C)] 98.6 F (37 C) (11/01 0700) Pulse Rate:  [72-87] 74 (11/01 0559) Cardiac Rhythm:  [-] Normal sinus rhythm (11/01 0021) Resp:  [18-33] 27 (11/01 0559) BP: (102-123)/(58-68) 122/58 mmHg (11/01 0513) SpO2:  [83 %-97 %] 86 % (11/01 0559) FiO2 (%):  [50 %-100 %] 100 % (10/31 2107)     Intake/Output from previous day: 10/31 0701 - 11/01 0700 In: 1320 [P.O.:720; I.V.:600] Out: 9147 [Urine:1700; Chest Tube:110]   Physical Exam:  Cardiovascular: RRR Pulmonary: Coarse breath sounds Abdomen: Soft, non tender, bowel sounds present. Extremities: SCDs in place Wounds: Dressing is clean and dry.  Chest Tube: to water seal, no air leak  Lab Results: CBC:  Recent Labs  05/19/14 0420 05/20/14 0331  WBC 13.0* 11.8*  HGB 13.0 12.6*  HCT 40.7 38.7*  PLT 212 175   BMET:   Recent Labs  05/19/14 0420 05/20/14 0331  NA 136* 136*  K 4.2 3.9  CL 100 100  CO2 24 24  GLUCOSE 143* 117*  BUN 18 13  CREATININE 1.50* 1.37*  CALCIUM 8.6 8.7    PT/INR:   Recent Labs  05/17/14 1519  LABPROT 13.7  INR 1.03   ABG:  INR: Will add last result for INR, ABG once components are confirmed Will add last 4 CBG results once components are confirmed  Assessment/Plan:  1. CV - SR in the 80's. On Cardizem CD 240 daily, Imdur 60 daily as taken pre op. 2.  Pulmonary -On NRB. Was on 4 liters of oxygen via [place] prior to surgery. Tried to wean patient to Venti mask, but desats into high 70's-low 80's. Chest tube with 110 cc of output since surgery. Chest tube is to water seal.There is no air leak this am. CXR taken this am but  unable to view. Encourage incentive spirometer 3. Creatinine down to 1.37 4. DM-CBGs  133/149/153 . On Glipizide 10 bid as taken pre op. 5. Foley to be removed  ZIMMERMAN,DONIELLE MPA-C 05/20/2014,8:22 AM    Chart reviewed, patient examined, agree with above. Will remove chest tube. Continue IS, flutter valve.

## 2014-05-20 NOTE — Progress Notes (Signed)
   05/20/14 0559  Oxygen Therapy  SpO2 (!) 86 %  O2 Device Non-rebreather Mask  Pulse Oximetry Type Continuous  Attempted to get pt up to bedside commode, pt respirations climbed as high as 40's and pt's sats fell to mid 80's pt refused to continue and get had simply moved pts leg off bed. Explained to pt need to get oob, for progression, pt still refused, unable to wean o2 at this time

## 2014-05-21 ENCOUNTER — Encounter (HOSPITAL_COMMUNITY): Payer: Self-pay | Admitting: Thoracic Surgery (Cardiothoracic Vascular Surgery)

## 2014-05-21 ENCOUNTER — Inpatient Hospital Stay (HOSPITAL_COMMUNITY): Payer: Medicaid Other

## 2014-05-21 DIAGNOSIS — J9621 Acute and chronic respiratory failure with hypoxia: Secondary | ICD-10-CM

## 2014-05-21 LAB — GLUCOSE, CAPILLARY
GLUCOSE-CAPILLARY: 129 mg/dL — AB (ref 70–99)
Glucose-Capillary: 211 mg/dL — ABNORMAL HIGH (ref 70–99)
Glucose-Capillary: 158 mg/dL — ABNORMAL HIGH (ref 70–99)
Glucose-Capillary: 130 mg/dL — ABNORMAL HIGH (ref 70–99)

## 2014-05-21 LAB — TISSUE CULTURE
CULTURE: NO GROWTH
Culture: NO GROWTH

## 2014-05-21 MED ORDER — POTASSIUM CHLORIDE 10 MEQ/100ML IV SOLN
10.0000 meq | INTRAVENOUS | Status: AC
  Administered 2014-05-21 (×3): 10 meq via INTRAVENOUS
  Filled 2014-05-21 (×2): qty 100

## 2014-05-21 MED ORDER — POTASSIUM CHLORIDE 10 MEQ/50ML IV SOLN: 10.0000 meq | INTRAVENOUS | Status: DC

## 2014-05-21 MED ORDER — ENOXAPARIN SODIUM 40 MG/0.4ML ~~LOC~~ SOLN
40.0000 mg | Freq: Every day | SUBCUTANEOUS | Status: DC
  Administered 2014-05-21 – 2014-05-25 (×5): 40 mg via SUBCUTANEOUS
  Filled 2014-05-21 (×5): qty 0.4

## 2014-05-21 MED ORDER — INSULIN ASPART 100 UNIT/ML ~~LOC~~ SOLN
0.0000 [IU] | Freq: Three times a day (TID) | SUBCUTANEOUS | Status: DC
  Administered 2014-05-21: 5 [IU] via SUBCUTANEOUS
  Administered 2014-05-21: 3 [IU] via SUBCUTANEOUS
  Administered 2014-05-22 (×2): 2 [IU] via SUBCUTANEOUS
  Administered 2014-05-22 – 2014-05-23 (×2): 5 [IU] via SUBCUTANEOUS
  Administered 2014-05-23 (×2): 2 [IU] via SUBCUTANEOUS
  Administered 2014-05-24: 3 [IU] via SUBCUTANEOUS
  Administered 2014-05-24: 2 [IU] via SUBCUTANEOUS
  Administered 2014-05-24: 3 [IU] via SUBCUTANEOUS
  Administered 2014-05-25: 2 [IU] via SUBCUTANEOUS

## 2014-05-21 MED ORDER — FUROSEMIDE 10 MG/ML IJ SOLN
40.0000 mg | Freq: Two times a day (BID) | INTRAMUSCULAR | Status: AC
  Administered 2014-05-21 (×2): 40 mg via INTRAVENOUS
  Filled 2014-05-21 (×2): qty 4

## 2014-05-21 MED ORDER — BUDESONIDE 0.5 MG/2ML IN SUSP
0.5000 mg | Freq: Two times a day (BID) | RESPIRATORY_TRACT | Status: DC
  Administered 2014-05-21 – 2014-05-24 (×6): 0.5 mg via RESPIRATORY_TRACT
  Filled 2014-05-21 (×8): qty 2

## 2014-05-21 MED ORDER — IPRATROPIUM-ALBUTEROL 0.5-2.5 (3) MG/3ML IN SOLN: 3.0000 mL | RESPIRATORY_TRACT | Status: DC | PRN

## 2014-05-21 MED ORDER — ARFORMOTEROL TARTRATE 15 MCG/2ML IN NEBU
15.0000 ug | INHALATION_SOLUTION | Freq: Two times a day (BID) | RESPIRATORY_TRACT | Status: DC
  Administered 2014-05-21 – 2014-05-24 (×5): 15 ug via RESPIRATORY_TRACT
  Filled 2014-05-21 (×9): qty 2

## 2014-05-21 MED ORDER — TAMSULOSIN HCL 0.4 MG PO CAPS
0.4000 mg | ORAL_CAPSULE | Freq: Every day | ORAL | Status: DC
  Administered 2014-05-21 – 2014-05-24 (×4): 0.4 mg via ORAL
  Filled 2014-05-21 (×5): qty 1

## 2014-05-21 MED ORDER — POTASSIUM CHLORIDE 10 MEQ/100ML IV SOLN
10.0000 meq | Freq: Every day | INTRAVENOUS | Status: DC | PRN
  Filled 2014-05-21: qty 100

## 2014-05-21 NOTE — Evaluation (Addendum)
Occupational Therapy Evaluation Patient Details Name: [name] MRN: [id number] DOB: May 23, 1956 Today's Date: 05/21/2014    History of Present Illness Patient is a 58 yo male with complex medical history now s/p LEFT VIDEO ASSISTED THORACOSCOPY (Left) and LUNG BIOPSY left upper lobe & left lower lobe (Left).   Clinical Impression   Pt s/p above. Pt independent with ADLs, PTA. Feel pt will benefit from acute OT to increase independence with BADLs and increase activity tolerance. Pt requiring rest breaks during session and cues for deep breathing technique (see below in vitals for O2 sats).     Follow Up Recommendations  Home health OT;Supervision/Assistance - 24 hour    Equipment Recommendations  None recommended by OT    Recommendations for Other Services       Precautions / Restrictions Precautions Precautions: Fall (SOB) Restrictions Weight Bearing Restrictions: No      Mobility Bed Mobility Overal bed mobility: Modified Independent                Transfers Overall transfer level: Needs assistance Equipment used: None Transfers: Sit to/from Stand Sit to Stand: Min guard         General transfer comment: Min guard for safety.    Balance                                            ADL Overall ADL's : Needs assistance/impaired     Grooming: Oral care;Wash/dry face;Applying deodorant;Brushing hair;Set up;Supervision/safety;Sitting   Upper Body Bathing: Set up;Supervision/ safety;Sitting   Lower Body Bathing: Min guard;Sit to/from stand   Upper Body Dressing : Set up;Supervision/safety;Sitting   Lower Body Dressing: Min guard;Sit to/from stand   Toilet Transfer: Min guard;Ambulation (ambulated a few steps to chair)           Functional mobility during ADLs: Min guard General ADL Comments: Educated on energy conservation techniques. Pt with decreased activity tolerance and requiring rest breaks during ADLs. Cues given for  deep breathing technique. Educated on Stage manager. Discussed d/c recommendations. Discussed 3 in 1 and how it can be used as shower chair. Pt with dizziness so sat EOB a few minutes (see below in vitals for BP and O2)     Vision                     Perception     Praxis      Pertinent Vitals/Pain Pain Assessment: No/denies pain   BP 121/67 sitting EOB after standing.  O2 dropped to 80's when mask taken off to brush teeth. Educated on deep breathing technique and mask placed back on and O2 trended up.     Hand Dominance Right   Extremity/Trunk Assessment Upper Extremity Assessment Upper Extremity Assessment: Overall WFL for tasks assessed   Lower Extremity Assessment Lower Extremity Assessment: Defer to PT evaluation       Communication Communication Communication: No difficulties   Cognition Arousal/Alertness: Awake/alert Behavior During Therapy: WFL for tasks assessed/performed Overall Cognitive Status: Within Functional Limits for tasks assessed                     General Comments       Exercises       Shoulder Instructions      Home Living Family/patient expects to be discharged to:: Private residence Living Arrangements: Alone Available Help at Discharge:  Family Type of Home: House Home Access: Ramped entrance     Home Layout: One level     Bathroom Shower/Tub: Walk-in shower         Home Equipment: Shower seat - built in;Bedside commode          Prior Functioning/Environment Level of Independence: Independent             OT Diagnosis: Generalized weakness;Other (comment) (cardiopulmonary status limiting activity)   OT Problem List: Decreased activity tolerance;Decreased strength;Impaired balance (sitting and/or standing);Decreased knowledge of use of DME or AE;Decreased knowledge of precautions;Cardiopulmonary status limiting activity   OT Treatment/Interventions: Self-care/ADL training;DME and/or AE  instruction;Therapeutic activities;Patient/family education;Balance training;Energy conservation    OT Goals(Current goals can be found in the care plan section) Acute Rehab OT Goals Patient Stated Goal: not stated OT Goal Formulation: With patient Time For Goal Achievement: 05/28/14 Potential to Achieve Goals: Good ADL Goals Pt Will Perform Upper Body Bathing: with set-up;sitting Pt Will Perform Lower Body Bathing: with set-up;sit to/from stand Pt Will Perform Lower Body Dressing: with set-up;sit to/from stand Pt Will Transfer to Toilet: with supervision;ambulating Additional ADL Goal #1: Pt will independently verbalize and demonstrate 3/3 energy conservation techniques.  OT Frequency: Min 2X/week   Barriers to D/C:            Co-evaluation              End of Session Equipment Utilized During Treatment: Gait belt; O2 (Non re-breather) Nurse Communication: Mobility status;Other (comment) (O2 sats and BP)  Pt left in chair with call bell within reacher and family member present  Activity Tolerance: Patient limited by fatigue Patient left: in chair;with call bell/phone within reach;with family/visitor present   Time: 6767-2094 OT Time Calculation (min): 33 min Charges:  OT General Charges $OT Visit: 1 Procedure OT Evaluation $Initial OT Evaluation Tier I: 1 Procedure OT Treatments $Self Care/Home Management : 8-22 mins G-CodesBenito Mccreedy OTR/L [phone] 05/21/2014, 6:49 PM

## 2014-05-21 NOTE — Progress Notes (Signed)
Attempted to wean patient oxygen needs placed on 6 Liter via nasal cannula saturations held 85-87 for about 5 minites then patient desatured to 73 %, with moderate dyspnea. Pt the placed on 50 % venturi mask and saturations were 87- to 90 % until pt got up out the bed to the chair  his saturations fell to 80% and after 4 minutes only returned  To 85 % . Pt the placed on 100 % non -rebreather

## 2014-05-21 NOTE — Progress Notes (Signed)
Foley catheter inserted per MD order for aggressive diuresis and acute urinary retention. 2 RNs present, sterile technique followed, peri care perform before and after catheter insertion. No s/s of acute distress noted.

## 2014-05-21 NOTE — Consult Note (Signed)
Name: [name] MRN: [id number] DOB: 1956-05-26    ADMISSION DATE:  05/18/2014 CONSULTATION DATE:  05/21/2014  REFERRING MD :  Merilynn Finland  CHIEF COMPLAINT:  Hypoxia  BRIEF PATIENT DESCRIPTION:  58 yo male smoker and prior hx of cocaine abuse had Rt nephrectomy for renal cell cancer in August 1287 complicated post-op by hypoxia.  He has been followed by Dr. Chase Caller as outpt.  CT chest from 04/20/14 showed changes possible NSIP.  He was admitted for VATS lung bx.  He had persistent hypoxia post-op and PCCM consulted.  SIGNIFICANT EVENTS  10/30 Lt VATS bx from LUL and LLL  STUDIES:  02/25/14 Echo >> mild LVH, EF 65 to 86%, grade 1 diastolic dysfx 7/67/20 PFT >> FEV1 3.67 (100%), FEV1% 83, TLC 6.11 (87%), DLCO 55%, no BD 04/19/14 CT chest >> b/l non calcified pleural plaques, mild centrilobular/paraseptal emphysema most at apices, patchy areas of GGO 05/04/14 Labs >> HP panel, aldolase, ANCA, SCL 70, SS-a/SS-b, CCP, ds DNA, ANA  >> all negative   HISTORY OF PRESENT ILLNESS:   58 yo male smoker was in hospital in August for Rt nephrectomy for renal cell cancer.  He developed hypoxia post-op and has been on 4 liters oxygen.  He has been followed by Dr. Chase Caller in pulmonary office.  He had CT chest which showed emphysema and possible ILD.  He was set up for VATS lung bx which he had done on 10/30 >> results still pending.  He developed worsening hypoxia post-op.  He has been given lasix with good diuresis, but not much improvement in his oxygenation.  He reports episodes of wheezing and chest congestion >> he felt better after neb tx.  His chest discomfort has improved since he had chest tube removed yesterday.  He denies congestion or sore throat.  He has not had leg swelling.  PAST MEDICAL HISTORY :   has a past medical history of Essential hypertension, benign; TIA (transient ischemic attack) (1997); Noncompliance; COPD (chronic obstructive pulmonary disease);  Hyperlipidemia; History of pneumonia; Type 2 diabetes mellitus (2011); GERD (gastroesophageal reflux disease); Substance abuse; Cocaine abuse; Renal cell carcinoma; Cholecystitis, acute (12/20/2013); Coronary atherosclerosis of native coronary artery; Depression; Anxiety; History of nephrolithiasis; Arthritis; Hepatitis (Late 1970s); Complication of anesthesia; Shortness of breath; and Sleep apnea.  has past surgical history that includes Appendectomy (1970's); Cholecystectomy (N/A, 12/22/2013); Robot assisted laparoscopic nephrectomy (Right, 02/21/2014); Laparoscopic lysis of adhesions (02/21/2014); Video assisted thoracoscopy (Left, 05/18/2014); Lung biopsy (Left, 05/18/2014); and Video bronchoscopy (05/18/2014). Prior to Admission medications   Medication Sig Start Date End Date Taking? Authorizing Provider  albuterol (PROVENTIL HFA;VENTOLIN HFA) 108 (90 BASE) MCG/ACT inhaler Inhale 2 puffs into the lungs every 6 (six) hours as needed for wheezing or shortness of breath.   Yes Historical Provider, MD  albuterol (PROVENTIL) (2.5 MG/3ML) 0.083% nebulizer solution Take 3 mLs (2.5 mg total) by nebulization every 3 (three) hours as needed for wheezing or shortness of breath. 02/27/14  Yes Nita Sells, MD  alfuzosin (UROXATRAL) 10 MG 24 hr tablet Take 10 mg by mouth daily with breakfast.   Yes Historical Provider, MD  aspirin 81 MG tablet Take 81 mg by mouth daily.   Yes Historical Provider, MD  citalopram (CELEXA) 20 MG tablet Take 20 mg by mouth daily.   Yes Historical Provider, MD  clopidogrel (PLAVIX) 75 MG tablet Take 75 mg by mouth daily.   Yes Historical Provider, MD  diltiazem (CARDIZEM CD) 240 MG 24 hr capsule Take 240 mg  by mouth every morning.   Yes Historical Provider, MD  Fish Oil-Cholecalciferol (FISH OIL + D3 PO) Take 1 capsule by mouth 4 (four) times daily.    Yes Historical Provider, MD  FLUoxetine (PROZAC) 10 MG capsule Take 10 mg by mouth every morning.   Yes Historical Provider, MD    fluticasone (FLOVENT HFA) 220 MCG/ACT inhaler Inhale 1 puff into the lungs 2 (two) times daily. 02/27/14  Yes Nita Sells, MD  furosemide (LASIX) 40 MG tablet Take 1 tablet (40 mg total) by mouth daily. 02/27/14  Yes Nita Sells, MD  gabapentin (NEURONTIN) 300 MG capsule Take 300 mg by mouth at bedtime.   Yes Historical Provider, MD  glipiZIDE (GLUCOTROL) 10 MG tablet Take 10 mg by mouth 2 (two) times daily.   Yes Historical Provider, MD  ipratropium-albuterol (DUONEB) 0.5-2.5 (3) MG/3ML SOLN Take 3 mLs by nebulization every 6 (six) hours as needed (Shortness of breath).   Yes Historical Provider, MD  isosorbide mononitrate (IMDUR) 60 MG 24 hr tablet Take 60 mg by mouth every morning.    Yes Historical Provider, MD  nitroGLYCERIN (NITROSTAT) 0.4 MG SL tablet Place 1 tablet (0.4 mg total) under the tongue every 5 (five) minutes as needed for chest pain. 06/03/12  Yes Ivor Costa, MD  ranitidine (ZANTAC) 300 MG tablet Take 300 mg by mouth every morning.    Yes Historical Provider, MD  rosuvastatin (CRESTOR) 20 MG tablet Take 20 mg by mouth daily.   Yes Historical Provider, MD  solifenacin (VESICARE) 5 MG tablet Take 5 mg by mouth daily.   Yes Historical Provider, MD  oxyCODONE (OXY IR/ROXICODONE) 5 MG immediate release tablet Take 1-2 tablets (5-10 mg total) by mouth every 4 (four) hours as needed for severe pain. 05/18/14   Donielle Liston Alba, PA-C   No Known Allergies  FAMILY HISTORY:  family history includes CAD in his brother and father; Cancer in his maternal grandfather, maternal grandmother, and mother; Diabetes in an other family member; Hypertension in an other family member; Lung cancer in his father; Stroke in an other family member. SOCIAL HISTORY:  reports that he has been smoking Cigarettes.  He started smoking about 42 years ago. He has a 20.5 pack-year smoking history. He has never used smokeless tobacco. He reports that he does not drink alcohol or use illicit  drugs.  REVIEW OF SYSTEMS:   Negative except above  SUBJECTIVE:   VITAL SIGNS: Temp:  [97.4 F (36.3 C)-98 F (36.7 C)] 97.5 F (36.4 C) (11/02 1614) Pulse Rate:  [74-80] 77 (11/02 1552) Resp:  [14-30] 18 (11/02 1552) BP: (104-128)/(52-71) 121/67 mmHg (11/02 1552) SpO2:  [92 %-98 %] 98 % (11/02 1552) FiO2 (%):  [100 %] 100 % (11/02 1552)  PHYSICAL EXAMINATION: General: sitting in chair Neuro:  Alert, normal strength, CN intact HEENT:  No sinus tenderness, no oral exudate Cardiovascular:  Regular, no murmur Lungs:  B/l rales at bases, no wheeze Abdomen:  Soft, non tender Musculoskeletal:  No edema Skin:  No rashes  CBC Recent Labs     05/19/14  0420  05/20/14  0331  WBC  13.0*  11.8*  HGB  13.0  12.6*  HCT  40.7  38.7*  PLT  212  175    BMET Recent Labs     05/19/14  0420  05/20/14  0331  NA  136*  136*  K  4.2  3.9  CL  100  100  CO2  24  24  BUN  18  13  CREATININE  1.50*  1.37*  GLUCOSE  143*  117*    Electrolytes Recent Labs     05/19/14  0420  05/20/14  0331  CALCIUM  8.6  8.7    ABG Recent Labs     05/19/14  0324  PHART  7.326*  PCO2ART  48.8*  PO2ART  57.1*    Liver Enzymes Recent Labs     05/20/14  0331  AST  15  ALT  16  ALKPHOS  71  BILITOT  0.4  ALBUMIN  2.7*    Cardiac Enzymes Recent Labs     05/19/14  0420  PROBNP  598.8*    Glucose Recent Labs     05/18/14  1823  05/21/14  0814  05/21/14  1119  05/21/14  1616  GLUCAP  153*  129*  211*  158*    Imaging Dg Chest Port 1 View  05/21/2014   CLINICAL DATA:  Chest pain .  EXAM: PORTABLE CHEST - 1 VIEW  COMPARISON:  05/20/2014.  FINDINGS: Cardiomegaly. Bilateral pulmonary infiltrates. These findings are consistent congestive heart. Small left pleural effusion is present. No pneumothorax. No acute bony abnormality.  IMPRESSION: Persistent changes of congestive heart failure with pulmonary edema and left pleural effusion.   Electronically Signed   By: Marcello Moores   Register   On: 05/21/2014 07:33   Dg Chest Port 1 View  05/20/2014   CLINICAL DATA:  Chest pain; recent lung biopsy  EXAM: PORTABLE CHEST - 1 VIEW  COMPARISON:  May 09, 2014  FINDINGS: Chest tube is again noted on the left. No pneumothorax. There is cardiomegaly with pulmonary venous hypertension and generalized interstitial edema. There is no alveolar consolidation. No adenopathy appreciable. There is degenerative change in the right shoulder.  IMPRESSION: Findings consistent with congestive heart failure. Chest tube on the left unchanged in position. No pneumothorax.   Electronically Signed   By: Lowella Grip M.D.   On: 05/20/2014 11:16    ASSESSMENT / PLAN:  Acute on chronic hypoxic respiratory failure in setting of presumed ILD, emphysema, ATX, ?pulmonary edema and sleep disordered breathing.  Hypoxic respiratory failure. Plan: Oxygen to keep SpO2 > 92%  Presumed ILD s/p Lt VATS Bx. Plan: Post-op care per primary team  F/u Bx results from 10/30 >>  F/u CXR 11/03  Tobacco abuse with emphysema on CT chest and diffusion defect on PFT. Plan: Change to scheduled pulmicort, brovana with prn albuterol/ipratropium Needs smoking cessation  Atx. Plan: Bronchial hygiene  Presumed sleep disordered breathing. Plan: BiPAP qhs and prn during the day  ?pulmonary edema >> appears euvolemic; negative 2 liters since admission. Plan: Further diuresis per primary team F/u BNP  Updated pts family at bedside about pulmonary plan.  Chesley Mires, MD [facility] Pulmonary/Critical Care 05/21/2014, 4:47 PM Pager:  [phone] After 3pm call: [phone]

## 2014-05-21 NOTE — Progress Notes (Addendum)
[address]       [place] [address]             [phone]          3 Days Post-Op Procedure(s) (LRB): LEFT VIDEO ASSISTED THORACOSCOPY (Left) LUNG BIOPSY left upper lobe & left lower lobe (Left) VIDEO BRONCHOSCOPY  Subjective: Breathing stable, remains on NRB mask. Not walking.  Appetite ok, no nausea.    Objective: Vital signs in last 24 hours: Patient Vitals for the past 24 hrs:  BP Temp Temp src Pulse Resp SpO2  05/21/14 0732 107/71 mmHg - - 79 18 95 %  05/21/14 0700 - 97.4 F (36.3 C) Oral - - -  05/21/14 0445 - 98 F (36.7 C) Oral - - -  05/21/14 0440 104/64 mmHg - - 74 14 95 %  05/21/14 0015 114/68 mmHg - - 80 16 93 %  05/21/14 0011 - 97.9 F (36.6 C) Oral - - -  05/20/14 2010 (!) 112/52 mmHg - - 80 16 95 %  05/20/14 2008 - 97.7 F (36.5 C) Oral - - -  05/20/14 2006 - - - - - 92 %  05/20/14 2002 - - Oral - - -  05/20/14 2001 - - - - (!) 24 93 %  05/20/14 1500 - 99.3 F (37.4 C) Oral - - -  05/20/14 1436 - - - 88 (!) 33 (!) 80 %  05/20/14 1425 99/61 mmHg - - 88 (!) 29 (!) 82 %  05/20/14 1201 113/63 mmHg 98.5 F (36.9 C) Oral 86 (!) 28 (!) 89 %  05/20/14 1002 - - - 73 (!) 29 100 %  05/20/14 0956 114/60 mmHg - - - - -  05/20/14 3785 - - - - - 97 %   Current Weight  05/18/14 232 lb (105.235 kg)     Intake/Output from previous day: 11/01 0701 - 11/02 0700 In: 840 [P.O.:600; I.V.:240] Out: 8850 [Urine:1500; Chest Tube:70]    PHYSICAL EXAM:  Heart: RRR Lungs: Clear though diminished BS bilaterally Wound: Clean and dry     Lab Results: CBC: Recent Labs  05/19/14 0420 05/20/14 0331  WBC 13.0* 11.8*  HGB 13.0 12.6*  HCT 40.7 38.7*  PLT 212 175   BMET:  Recent Labs  05/19/14 0420 05/20/14 0331  NA 136* 136*  K 4.2 3.9  CL 100 100  CO2 24 24  GLUCOSE 143* 117*  BUN 18 13  CREATININE 1.50* 1.37*  CALCIUM 8.6 8.7    PT/INR: No results for input(s): LABPROT, INR in the last 72 hours.  CXR:  FINDINGS: Cardiomegaly. Bilateral pulmonary infiltrates. These findings are consistent congestive heart. Small left pleural effusion is present. No pneumothorax. No acute bony abnormality.  IMPRESSION: Persistent changes of congestive heart failure with pulmonary edema and left pleural effusion  Assessment/Plan: S/P Procedure(s) (LRB): LEFT VIDEO ASSISTED THORACOSCOPY (Left) LUNG BIOPSY left upper lobe & left lower lobe (Left) VIDEO BRONCHOSCOPY Pulm- Continue aggressive pulm toilet, nebs.  Will add IV Lasix for edema component. Wean O2 as able- he is on 4L at home. GU- urinary retention, will replace Foley and start Flomax. Cr trending down, continue to watch. Ambulate in halls today. DM- continue home meds. Still using PCA.  Encouraged patient to use po meds, wean PCA today.    LOS: 3 days    COLLINS,GINA H 05/21/2014  Patient seen and examined, agree with above He is volume overloaded- acute on chronic systolic and diastolic  heart failure Will replace foley and diurese Will ask Pulmonary/ CCM to see as well

## 2014-05-21 NOTE — Progress Notes (Signed)
UR completed 

## 2014-05-21 NOTE — Progress Notes (Signed)
PT Cancellation Note  Patient Details Name: [name] MRN: [id number] DOB: 29-Apr-1956   Cancelled Treatment:    Reason Eval/Treat Not Completed: Medical issues which prohibited therapy;Fatigue/lethargy limiting ability to participate, pt back on NRB and not feeling well today. Has been to [facility] with nursing staff. Will check back tomorrow.   [place], [place] 05/21/2014, 1:06 PM

## 2014-05-22 ENCOUNTER — Inpatient Hospital Stay (HOSPITAL_COMMUNITY): Payer: Medicaid Other

## 2014-05-22 DIAGNOSIS — J9620 Acute and chronic respiratory failure, unspecified whether with hypoxia or hypercapnia: Secondary | ICD-10-CM

## 2014-05-22 LAB — BASIC METABOLIC PANEL
Anion gap: 12 (ref 5–15)
BUN: 18 mg/dL (ref 6–23)
CHLORIDE: 93 meq/L — AB (ref 96–112)
CO2: 30 meq/L (ref 19–32)
Calcium: 9.2 mg/dL (ref 8.4–10.5)
Creatinine, Ser: 1.39 mg/dL — ABNORMAL HIGH (ref 0.50–1.35)
GFR calc Af Amer: 63 mL/min — ABNORMAL LOW (ref >90)
GFR calc non Af Amer: 54 mL/min — ABNORMAL LOW (ref >90)
Glucose, Bld: 134 mg/dL — ABNORMAL HIGH (ref 70–99)
Potassium: 3.2 mEq/L — ABNORMAL LOW (ref 3.7–5.3)
SODIUM: 135 meq/L — AB (ref 137–147)

## 2014-05-22 LAB — GLUCOSE, CAPILLARY
GLUCOSE-CAPILLARY: 150 mg/dL — AB (ref 70–99)
Glucose-Capillary: 226 mg/dL — ABNORMAL HIGH (ref 70–99)
Glucose-Capillary: 136 mg/dL — ABNORMAL HIGH (ref 70–99)
Glucose-Capillary: 199 mg/dL — ABNORMAL HIGH (ref 70–99)

## 2014-05-22 LAB — CBC
HEMATOCRIT: 37 % — AB (ref 39.0–52.0)
HEMOGLOBIN: 12.3 g/dL — AB (ref 13.0–17.0)
MCH: 27.8 pg (ref 26.0–34.0)
MCHC: 33.2 g/dL (ref 30.0–36.0)
MCV: 83.7 fL (ref 78.0–100.0)
Platelets: 208 10*3/uL (ref 150–400)
RBC: 4.42 MIL/uL (ref 4.22–5.81)
RDW: 14 % (ref 11.5–15.5)
WBC: 8.1 10*3/uL (ref 4.0–10.5)

## 2014-05-22 LAB — PRO B NATRIURETIC PEPTIDE: PRO B NATRI PEPTIDE: 297.7 pg/mL — AB (ref 0–125)

## 2014-05-22 MED ORDER — POTASSIUM CHLORIDE CRYS ER 20 MEQ PO TBCR
20.0000 meq | EXTENDED_RELEASE_TABLET | Freq: Two times a day (BID) | ORAL | Status: DC
  Administered 2014-05-22 – 2014-05-25 (×7): 20 meq via ORAL
  Filled 2014-05-22 (×9): qty 1

## 2014-05-22 MED ORDER — GUAIFENESIN ER 600 MG PO TB12
1200.0000 mg | ORAL_TABLET | Freq: Two times a day (BID) | ORAL | Status: DC
  Administered 2014-05-22 – 2014-05-25 (×7): 1200 mg via ORAL
  Filled 2014-05-22 (×8): qty 2

## 2014-05-22 NOTE — Progress Notes (Signed)
Weaned pt from partial nonrebreather to Venti mask at 12L, 50% O2. O2 Sats 93%. Will continue to monitor.

## 2014-05-22 NOTE — Progress Notes (Signed)
Pt. Requested to come off BIPAP stating that he didn't want to wear it any longer. Pt. Was encouraged to stay on BIPAP until the morning but pt. Refused to do so. Pt. Was placed on a partial re-breather. Vitals are as follows; HR: 74, RR: 20, SAT's: 95%, BP: 111/58. RN was made aware that pt. Was taken off BIPAP.

## 2014-05-22 NOTE — Plan of Care (Signed)
Problem: Acute Rehab PT Goals(only PT should resolve) Goal: Pt Will Go Supine/Side To Sit Outcome: Completed/Met Date Met:  05/22/14

## 2014-05-22 NOTE — Progress Notes (Addendum)
       [address]       [place] [address]             [phone]          4 Days Post-Op Procedure(s) (LRB): LEFT VIDEO ASSISTED THORACOSCOPY (Left) LUNG BIOPSY left upper lobe & left lower lobe (Left) VIDEO BRONCHOSCOPY  Subjective: Breathing "about the same" this am, no new issues. Has not really been walking much.   Objective: Vital signs in last 24 hours: Patient Vitals for the past 24 hrs:  BP Temp Temp src Pulse Resp SpO2  05/22/14 0251 (!) 111/58 mmHg 99 F (37.2 C) Axillary 74 15 93 %  05/22/14 0016 103/61 mmHg 98.9 F (37.2 C) Axillary 79 16 92 %  05/21/14 2356 (!) 116/59 mmHg - - 80 (!) 22 95 %  05/21/14 2106 - - - - - 97 %  05/21/14 2058 (!) 116/59 mmHg 98.3 F (36.8 C) Axillary 68 16 97 %  05/21/14 1920 - - - 75 18 94 %  05/21/14 1614 - 97.5 F (36.4 C) Oral - - -  05/21/14 1552 121/67 mmHg - - 77 18 98 %  05/21/14 1122 128/68 mmHg - - 77 (!) 30 93 %  05/21/14 1116 - 97.5 F (36.4 C) Oral - - -  05/21/14 0941 107/71 mmHg - - - - -  05/21/14 0933 - - - - - 95 %   Current Weight  05/18/14 232 lb (105.235 kg)     Intake/Output from previous day: 11/02 0701 - 11/03 0700 In: 1380 [P.O.:840; I.V.:240; IV Piggyback:300] Out: 6525 [Urine:6525]  CBGs [phone]   PHYSICAL EXAM:  Heart: RRR Lungs: Diminished BS bilaterally Wound: Clean and dry    Lab Results: CBC: Recent Labs  05/20/14 0331 05/22/14 0308  WBC 11.8* 8.1  HGB 12.6* 12.3*  HCT 38.7* 37.0*  PLT 175 208   BMET:  Recent Labs  05/20/14 0331 05/22/14 0308  NA 136* 135*  K 3.9 3.2*  CL 100 93*  CO2 24 30  GLUCOSE 117* 134*  BUN 13 18  CREATININE 1.37* 1.39*  CALCIUM 8.7 9.2    PT/INR: No results for input(s): LABPROT, INR in the last 72 hours.    Assessment/Plan: S/P Procedure(s) (LRB): LEFT VIDEO ASSISTED THORACOSCOPY (Left) LUNG BIOPSY left upper lobe & left lower lobe (Left) VIDEO BRONCHOSCOPY Pulm- Continue aggressive pulm toilet,  nebs. Appreciate pulm/CCM assistance. Wean O2 as able- he is on 4L at home. Surgical path pending, cx's negative. GU- urinary retention, continue Foley, Flomax. Possible voiding trial in next few days. Renal- Cr stable, continue to watch. Acute/chronic systolic/diastolic HF- excellent diuresis yesterday.  Will switch to po Lasix today and continue diuresis. Hypokalemia- replace K+. Deconditioning- Needs to ambulate in halls today. Continue PT/OT. DM- sugars generally stable, continue home meds. Pt states he has not really been using PCA. Will d/c PCA, saline lock IVF.    LOS: 4 days    COLLINS,GINA H 05/22/2014  Looks a little better today  He diuresed well yesterday. His BNP is improved. Will just give PO lasix today and reassess whether he needs more IV lasix tomorrow  His CXR shows right middle lobe atelectasis this AM. Will add mucinex, IS and flutter Q1 hour  Might benefit from IPPB- will defer to Pulmonary

## 2014-05-22 NOTE — Progress Notes (Signed)
Physical Therapy Treatment Patient Details Name: [name] MRN: [id number] DOB: 24-Dec-1955 Today's Date: 05/22/2014    History of Present Illness Patient is a 58 yo male with complex medical history now s/p LEFT VIDEO ASSISTED THORACOSCOPY (Left) and LUNG BIOPSY left upper lobe & left lower lobe (Left).    PT Comments    Patient progressing nicely towards physical therapy goals. Ambulated up to 150 feet today while using a rolling walker. SpO2 dropped very briefly to 86% while on 15L supplemental O2 but maintained 97% after a short rest break with deep breathing techniques. Overall, endurance and stability improving as he is tolerating gait training and therapeutic exercises well. Patient will continue to benefit from skilled physical therapy services to further improve independence with functional mobility.  Follow Up Recommendations  Home health PT;Supervision/Assistance - 24 hour (pending progress)     Equipment Recommendations  Rolling walker with 5" wheels    Recommendations for Other Services       Precautions / Restrictions Precautions Precautions: Fall (SOB) Restrictions Weight Bearing Restrictions: No    Mobility  Bed Mobility Overal bed mobility: Modified Independent                Transfers Overall transfer level: Needs assistance Equipment used: None Transfers: Sit to/from Stand Sit to Stand: Min guard         General transfer comment: Min guard for safety. Pt with mild sway upon standing but able to self correct. Improved with UE support on RW.  Ambulation/Gait Ambulation/Gait assistance: Min guard Ambulation Distance (Feet): 150 Feet Assistive device: Rolling walker (2 wheeled) Gait Pattern/deviations: Step-through pattern;Decreased stride length;Scissoring;Narrow base of support   Gait velocity interpretation: Below normal speed for age/gender General Gait Details: Frequent VC to widen base of support. SpO2 dropped to 86% very briefly  while on 15L supplemental O2 after ambulating 50 feet. This returned to 97% after stopping and resting with cues for deep breathing techniques and maintained elevated for duration of bout. No loss of balance noted with rolling walker.   Stairs            Wheelchair Mobility    Modified Rankin (Stroke Patients Only)       Balance                                    Cognition Arousal/Alertness: Awake/alert Behavior During Therapy: WFL for tasks assessed/performed Overall Cognitive Status: Within Functional Limits for tasks assessed                      Exercises General Exercises - Lower Extremity Ankle Circles/Pumps: AROM;10 reps;Seated Quad Sets: Strengthening;Both;10 reps;Seated Gluteal Sets: Strengthening;Both;5 reps;Seated Long Arc Quad: Strengthening;Both;10 reps;Seated Heel Slides: Strengthening;Both;10 reps;Seated Hip ABduction/ADduction: Strengthening;Both;10 reps;Seated Straight Leg Raises: Strengthening;Both;10 reps;Seated Hip Flexion/Marching: Strengthening;Both;10 reps;Seated    General Comments General comments (skin integrity, edema, etc.): Reviewed engery conservation techniques with patient.      Pertinent Vitals/Pain Pain Assessment: No/denies pain  SpO2 86-97% while on 15 L supplemental O2    Home Living                      Prior Function            PT Goals (current goals can now be found in the care plan section) Acute Rehab PT Goals PT Goal Formulation: With patient/family Time For Goal Achievement: 06/03/14 Potential  to Achieve Goals: Good Progress towards PT goals: Progressing toward goals    Frequency  Min 3X/week    PT Plan Current plan remains appropriate    Co-evaluation             End of Session Equipment Utilized During Treatment: Gait belt;Oxygen (15L venti mask) Activity Tolerance: Patient tolerated treatment well Patient left: in chair;with call bell/phone within reach;with  family/visitor present     Time: 7416-3845 PT Time Calculation (min): 24 min  Charges:  $Gait Training: 8-22 mins $Therapeutic Exercise: 8-22 mins                    G Codes:      Ellouise Newer 06-08-14, 11:52 AM  Elayne Snare, [place]

## 2014-05-22 NOTE — Progress Notes (Signed)
Name: [name] MRN: [id number] DOB: 03-Jul-1956    ADMISSION DATE:  05/18/2014 CONSULTATION DATE:  05/21/2014  REFERRING MD :  Merilynn Finland  CHIEF COMPLAINT:  Hypoxia  BRIEF PATIENT DESCRIPTION:  58 yo male smoker and prior hx of cocaine abuse had Rt nephrectomy for renal cell cancer in August 7616 complicated post-op by hypoxia.  He has been followed by Dr. Chase Caller as outpt.  CT chest from 04/20/14 showed changes possible NSIP.  He was admitted for VATS lung bx.  He had persistent hypoxia post-op and PCCM consulted.  SIGNIFICANT EVENTS  10/30 Lt VATS bx from LUL and LLL  STUDIES:  02/25/14 Echo >> mild LVH, EF 65 to 07%, grade 1 diastolic dysfx 3/71/06 PFT >> FEV1 3.67 (100%), FEV1% 83, TLC 6.11 (87%), DLCO 55%, no BD 04/19/14 CT chest >> b/l non calcified pleural plaques, mild centrilobular/paraseptal emphysema most at apices, patchy areas of GGO 05/04/14 Labs >> HP panel, aldolase, ANCA, SCL 70, SS-a/SS-b, CCP, ds DNA, ANA  >> all negative  SUBJECTIVE:  Breathing improved some.  Decreased chest pain.  BiPAP helped breathing overnight.  VITAL SIGNS: Temp:  [97.5 F (36.4 C)-99 F (37.2 C)] 97.9 F (36.6 C) (11/03 0800) Pulse Rate:  [67-80] 67 (11/03 0807) Resp:  [15-30] 18 (11/03 0807) BP: (103-129)/(58-69) 129/69 mmHg (11/03 0807) SpO2:  [92 %-98 %] 93 % (11/03 0909) FiO2 (%):  [50 %-100 %] 50 % (11/03 0251)  PHYSICAL EXAMINATION: General: no distress Neuro:  Alert, normal strength HEENT:  No sinus tenderness, no oral exudate Cardiovascular:  Regular, no murmur Lungs: decreased BS, no wheeze Abdomen:  Soft, non tender Musculoskeletal:  No edema Skin:  No rashes  CBC Recent Labs     05/20/14  0331  05/22/14  0308  WBC  11.8*  8.1  HGB  12.6*  12.3*  HCT  38.7*  37.0*  PLT  175  208    BMET Recent Labs     05/20/14  0331  05/22/14  0308  NA  136*  135*  K  3.9  3.2*  CL  100  93*  CO2  24  30  BUN  13  18  CREATININE  1.37*  1.39*    GLUCOSE  117*  134*    Electrolytes Recent Labs     05/20/14  0331  05/22/14  0308  CALCIUM  8.7  9.2    Liver Enzymes Recent Labs     05/20/14  0331  AST  15  ALT  16  ALKPHOS  71  BILITOT  0.4  ALBUMIN  2.7*    Cardiac Enzymes Recent Labs     05/22/14  0308  PROBNP  297.7*    Glucose Recent Labs     05/21/14  0814  05/21/14  1119  05/21/14  1616  05/21/14  2209  05/22/14  0818  GLUCAP  129*  211*  158*  130*  150*    Imaging Dg Chest Port 1 View  05/22/2014   CLINICAL DATA:  CHF with pulmonary edema with history of COPD and diabetes.  EXAM: PORTABLE CHEST - 1 VIEW  COMPARISON:  Portable chest x-ray of May 21, 2014  FINDINGS: The cardiopericardial silhouette remains enlarged. There is increased density inferior medially on the right consistent with middle lobe atelectasis. On the left there remains increased density in the mid and lower lung as well. The pulmonary vascularity is indistinct. The trachea is midline.  There are loops of mildly  distended bowel in the upper abdomen little changed from yesterday's study.  IMPRESSION: Congestive heart failure with mild pulmonary interstitial edema. Subsegmental atelectasis in the mid and lower lung on the left is present and stable. On the right increased conspicuity of atelectasis of presumably the right middle lobe is demonstrated. When the patient can tolerate the procedure, a PA and lateral chest x-ray would be useful.   Electronically Signed   By: David  [name]   On: 05/22/2014 08:00   Dg Chest Port 1 View  05/21/2014   CLINICAL DATA:  Chest pain .  EXAM: PORTABLE CHEST - 1 VIEW  COMPARISON:  05/20/2014.  FINDINGS: Cardiomegaly. Bilateral pulmonary infiltrates. These findings are consistent congestive heart. Small left pleural effusion is present. No pneumothorax. No acute bony abnormality.  IMPRESSION: Persistent changes of congestive heart failure with pulmonary edema and left pleural effusion.   Electronically  Signed   By: Marcello Moores  Register   On: 05/21/2014 07:33    ASSESSMENT / PLAN:  Acute on chronic hypoxic respiratory failure in setting of presumed ILD, emphysema, ATX, ?pulmonary edema and sleep disordered breathing.  Hypoxic respiratory failure. Plan: Oxygen to keep SpO2 > 92%  Presumed ILD s/p Lt VATS Bx. Plan: Post-op care per primary team  F/u Bx results from 10/30 >>  F/u CXR 11/03 intermittently  Tobacco abuse with emphysema on CT chest and diffusion defect on PFT. Plan: Continue pulmicort, brovana with prn albuterol/ipratropium Needs smoking cessation  Atx. Plan: Bronchial hygiene  Presumed sleep disordered breathing. Plan: BiPAP qhs and prn during the day  ?pulmonary edema >> appears euvolemic; negative 2 liters since admission. Plan: Further diuresis per primary team F/u BNP  Summary: Perhaps some improvement compared to 11/02.  Need to push mobilizing as tolerated with physical therapy.  Chesley Mires, MD [facility] Pulmonary/Critical Care 05/22/2014, 9:50 AM Pager:  [phone] After 3pm call: [phone]

## 2014-05-22 NOTE — Progress Notes (Signed)
Placed pt. On BIPAP 12/6, back up rate: 8, 50% via FFM. Pt. Is tolerating BIPAP well at this time without any complications.

## 2014-05-23 ENCOUNTER — Inpatient Hospital Stay (HOSPITAL_COMMUNITY): Payer: Medicaid Other

## 2014-05-23 LAB — BASIC METABOLIC PANEL
ANION GAP: 11 (ref 5–15)
BUN: 20 mg/dL (ref 6–23)
CALCIUM: 9.6 mg/dL (ref 8.4–10.5)
CO2: 30 mEq/L (ref 19–32)
CREATININE: 1.41 mg/dL — AB (ref 0.50–1.35)
Chloride: 97 mEq/L (ref 96–112)
GFR calc Af Amer: 62 mL/min — ABNORMAL LOW (ref >90)
GFR calc non Af Amer: 53 mL/min — ABNORMAL LOW (ref >90)
Glucose, Bld: 98 mg/dL (ref 70–99)
Potassium: 3.7 mEq/L (ref 3.7–5.3)
Sodium: 138 mEq/L (ref 137–147)

## 2014-05-23 LAB — CBC
HCT: 38 % — ABNORMAL LOW (ref 39.0–52.0)
Hemoglobin: 12.4 g/dL — ABNORMAL LOW (ref 13.0–17.0)
MCH: 27.5 pg (ref 26.0–34.0)
MCHC: 32.6 g/dL (ref 30.0–36.0)
MCV: 84.3 fL (ref 78.0–100.0)
Platelets: 211 10*3/uL (ref 150–400)
RBC: 4.51 MIL/uL (ref 4.22–5.81)
RDW: 14 % (ref 11.5–15.5)
WBC: 7.1 10*3/uL (ref 4.0–10.5)

## 2014-05-23 LAB — PRO B NATRIURETIC PEPTIDE: Pro B Natriuretic peptide (BNP): 221.3 pg/mL — ABNORMAL HIGH (ref 0–125)

## 2014-05-23 LAB — GLUCOSE, CAPILLARY
GLUCOSE-CAPILLARY: 124 mg/dL — AB (ref 70–99)
GLUCOSE-CAPILLARY: 158 mg/dL — AB (ref 70–99)
Glucose-Capillary: 130 mg/dL — ABNORMAL HIGH (ref 70–99)
Glucose-Capillary: 220 mg/dL — ABNORMAL HIGH (ref 70–99)

## 2014-05-23 MED ORDER — DOCUSATE SODIUM 100 MG PO CAPS
100.0000 mg | ORAL_CAPSULE | Freq: Two times a day (BID) | ORAL | Status: DC
  Administered 2014-05-23 – 2014-05-25 (×5): 100 mg via ORAL
  Filled 2014-05-23 (×5): qty 1

## 2014-05-23 MED ORDER — LACTULOSE 10 GM/15ML PO SOLN
20.0000 g | Freq: Every day | ORAL | Status: DC | PRN
Start: 2014-05-23 — End: 2014-05-25
  Filled 2014-05-23: qty 30

## 2014-05-23 NOTE — Progress Notes (Signed)
Placed patient on CPAP 10.0 cm H20 50% FIO2.  Patient states he feels too much air/pressure.  Decreased setting to 8.0 cm H20.  Patient states not enough air.  Patient placed on BIPAP 12/6 previous settings on BIPAP recently.  Patient is becoming anxious, showing increased WOB.  RR 48, Spo2 97% HR 78.  Patient states his is not able to tolerate at this time.  Placed patient on 5 lpm [place].

## 2014-05-23 NOTE — Progress Notes (Addendum)
       [address]       [place] [address]             [phone]          5 Days Post-Op Procedure(s) (LRB): LEFT VIDEO ASSISTED THORACOSCOPY (Left) LUNG BIOPSY left upper lobe & left lower lobe (Left) VIDEO BRONCHOSCOPY  Subjective: Feels a little better today. Breathing stable.  Worked with PT yesterday and improving with mobility. Needs to have a BM.   Objective: Vital signs in last 24 hours: Patient Vitals for the past 24 hrs:  BP Temp Temp src Pulse Resp SpO2  05/23/14 0520 125/68 mmHg 97.5 F (36.4 C) Axillary 67 14 92 %  05/23/14 0034 (!) 110/56 mmHg 97.2 F (36.2 C) Axillary 68 19 97 %  05/22/14 2320 - - - 73 17 94 %  05/22/14 2109 115/64 mmHg 99 F (37.2 C) Axillary 75 18 92 %  05/22/14 2034 - - - - - 95 %  05/22/14 1512 (!) 104/59 mmHg - - 74 19 95 %  05/22/14 1500 - 97.9 F (36.6 C) Oral - - -  05/22/14 1100 - 98 F (36.7 C) Oral - - -  05/22/14 1055 135/67 mmHg - - 78 19 97 %  05/22/14 0909 - - - - - 93 %  05/22/14 0807 129/69 mmHg - - 67 18 94 %  05/22/14 0800 - 97.9 F (36.6 C) Oral - - -   Current Weight  05/18/14 232 lb (105.235 kg)     Intake/Output from previous day: 11/03 0701 - 11/04 0700 In: -  Out: 3250 [Urine:3250]  CBGs [phone]   PHYSICAL EXAM:  Heart: RRR Lungs: Decreased BS bilaterally Wound: Clean and dry    Lab Results: CBC: Recent Labs  05/22/14 0308 05/23/14 0300  WBC 8.1 7.1  HGB 12.3* 12.4*  HCT 37.0* 38.0*  PLT 208 211   BMET:  Recent Labs  05/22/14 0308 05/23/14 0300  NA 135* 138  K 3.2* 3.7  CL 93* 97  CO2 30 30  GLUCOSE 134* 98  BUN 18 20  CREATININE 1.39* 1.41*  CALCIUM 9.2 9.6    PT/INR: No results for input(s): LABPROT, INR in the last 72 hours.  Pro-BNP 221.3   Assessment/Plan: S/P Procedure(s) (LRB): LEFT VIDEO ASSISTED THORACOSCOPY (Left) LUNG BIOPSY left upper lobe & left lower lobe (Left) VIDEO BRONCHOSCOPY  Pulm- Continues to require Bipap, O2 by NRB  mask.  Continue aggressive pulm toilet, nebs. Appreciate pulm/CCM assistance. He is on 4L O2 at home. Surgical path pending, cx's negative.  GU- urinary retention requiring replacement of Foley, Flomax started. Possible voiding trial today.  Renal- Cr slightly increased today, continue to monitor.  Acute/chronic systolic/diastolic HF- Continues to diurese well on po Lasix.   Deconditioning- Improving with mobility. Continue PT/OT.  DM- sugars generally stable, continue home meds.  LOC today.  Overall stable from surgical standpoint.  Home once pulmonary issues stable.     LOS: 5 days    COLLINS,GINA H 05/23/2014  D/c foly this am voiding trail Home when stable from pulmonary view per pulmonary service Continue lasix po I have seen and examined [name] and agree with the above assessment  and plan.  Grace Isaac MD Bee[place] [phone] Office [phone] 05/23/2014 10:27 AM

## 2014-05-23 NOTE — Progress Notes (Signed)
   05/23/14 2332  BiPAP/CPAP/SIPAP  BiPAP/CPAP/SIPAP Pt Type Adult  Mask Type Full face mask  Mask Size Large  Set Rate 8 breaths/min  Respiratory Rate 22 breaths/min  IPAP 12 cmH20  EPAP 6 cmH2O  Oxygen Percent 50 %  Minute Ventilation 16.4  Leak 21  Peak Inspiratory Pressure (PIP) 12  Tidal Volume (Vt) 720  BiPAP/CPAP/SIPAP BiPAP  Patient Home Equipment No  Auto Titrate No  Press High Alarm 25 cmH2O  Press Low Alarm 5 cmH2O  BiPAP/CPAP /SiPAP Vitals  Pulse Rate 69  Resp (!) 22  SpO2 98 %

## 2014-05-23 NOTE — Progress Notes (Signed)
Name: [name] MRN: [id number] DOB: 03/04/1956    ADMISSION DATE:  05/18/2014 CONSULTATION DATE:  05/21/2014  REFERRING MD :  Merilynn Finland  CHIEF COMPLAINT:  Hypoxia  BRIEF PATIENT DESCRIPTION:  58 yo male smoker and prior hx of cocaine abuse had Rt nephrectomy for renal cell cancer in August 8811 complicated post-op by hypoxia.  He has been followed by Dr. Chase Caller as outpt.  CT chest from 04/20/14 showed changes possible NSIP.  He was admitted for VATS lung bx.  He had persistent hypoxia post-op and PCCM consulted.  SIGNIFICANT EVENTS  10/30 Lt VATS bx from LUL and LLL  STUDIES:  02/25/14 Echo >> mild LVH, EF 65 to 03%, grade 1 diastolic dysfx 1/59/45 PFT >> FEV1 3.67 (100%), FEV1% 83, TLC 6.11 (87%), DLCO 55%, no BD 04/19/14 CT chest >> b/l non calcified pleural plaques, mild centrilobular/paraseptal emphysema most at apices, patchy areas of GGO 05/04/14 Labs >> HP panel, aldolase, ANCA, SCL 70, SS-a/SS-b, CCP, ds DNA, ANA  >> all negative  SUBJECTIVE:  Transitioned to nasal cannula.  Walked in hall this AM.  VITAL SIGNS: Temp:  [97.2 F (36.2 C)-99 F (37.2 C)] 98.5 F (36.9 C) (11/04 0700) Pulse Rate:  [66-75] 66 (11/04 0700) Resp:  [13-19] 13 (11/04 0700) BP: (104-125)/(56-68) 111/56 mmHg (11/04 0700) SpO2:  [91 %-97 %] 93 % (11/04 0830) FiO2 (%):  [50 %] 50 % (11/04 0821)  PHYSICAL EXAMINATION: General: no distress Neuro:  Alert, normal strength HEENT:  No sinus tenderness, no oral exudate Cardiovascular:  Regular, no murmur Lungs: decreased BS, no wheeze Abdomen:  Soft, non tender Musculoskeletal:  No edema Skin:  No rashes  CBC Recent Labs     05/22/14  0308  05/23/14  0300  WBC  8.1  7.1  HGB  12.3*  12.4*  HCT  37.0*  38.0*  PLT  208  211    BMET Recent Labs     05/22/14  0308  05/23/14  0300  NA  135*  138  K  3.2*  3.7  CL  93*  97  CO2  30  30  BUN  18  20  CREATININE  1.39*  1.41*  GLUCOSE  134*  98     Electrolytes Recent Labs     05/22/14  0308  05/23/14  0300  CALCIUM  9.2  9.6    Cardiac Enzymes Recent Labs     05/22/14  0308  05/23/14  0300  PROBNP  297.7*  221.3*    Glucose Recent Labs     05/21/14  2209  05/22/14  0818  05/22/14  1149  05/22/14  1627  05/22/14  2114  05/23/14  0756  GLUCAP  130*  150*  226*  136*  199*  130*    Imaging Dg Chest 2 View  05/23/2014   CLINICAL DATA:  Interstitial lung disease, shortness of breath subsequent evaluation  EXAM: CHEST  2 VIEW  COMPARISON:  05/22/2014, 05/21/2014  FINDINGS: Mild cardiac enlargement. Mild interstitial change in the left mid to lower lung zone and mild right lower lobe interstitial change. Blunting of the left costophrenic angle.  IMPRESSION: Further improvement in findings consistent with congestive heart failure with minimal residual interstitial pulmonary edema and small left pleural effusion.   Electronically Signed   By: Skipper Cliche M.D.   On: 05/23/2014 08:08   Dg Chest Port 1 View  05/22/2014   CLINICAL DATA:  CHF with pulmonary edema with  history of COPD and diabetes.  EXAM: PORTABLE CHEST - 1 VIEW  COMPARISON:  Portable chest x-ray of May 21, 2014  FINDINGS: The cardiopericardial silhouette remains enlarged. There is increased density inferior medially on the right consistent with middle lobe atelectasis. On the left there remains increased density in the mid and lower lung as well. The pulmonary vascularity is indistinct. The trachea is midline.  There are loops of mildly distended bowel in the upper abdomen little changed from yesterday's study.  IMPRESSION: Congestive heart failure with mild pulmonary interstitial edema. Subsegmental atelectasis in the mid and lower lung on the left is present and stable. On the right increased conspicuity of atelectasis of presumably the right middle lobe is demonstrated. When the patient can tolerate the procedure, a PA and lateral chest x-ray would be  useful.   Electronically Signed   By: David  [name]   On: 05/22/2014 08:00    ASSESSMENT / PLAN:  Acute on chronic hypoxic respiratory failure in setting of presumed ILD, emphysema, ATX, pulmonary edema and sleep disordered breathing.  Hypoxic respiratory failure >> improving. Plan: Oxygen to keep SpO2 > 92%  Presumed ILD s/p Lt VATS Bx. Plan: Post-op care per primary team  F/u Bx results from 10/30 >>  F/u CXR intermittently  Tobacco abuse with emphysema on CT chest and diffusion defect on PFT. Plan: Continue pulmicort, brovana with prn albuterol/ipratropium Needs smoking cessation  Atx. Plan: Bronchial hygiene  Presumed sleep disordered breathing. Plan: Change to auto CPAP qhs  Pulmonary edema. Plan: Further diuresis per primary team  Summary: Continues to improve.  Hopefully will be ready for d/c home in next 48 hrs >> defer to primary team  Updated family at bedside.  Chesley Mires, MD [facility] Pulmonary/Critical Care 05/23/2014, 11:05 AM Pager:  [phone] After 3pm call: [phone]

## 2014-05-23 NOTE — Plan of Care (Signed)
Problem: Phase II Progression Outcomes Goal: Incision intact & without signs/symptoms of infection Outcome: Progressing Goal: Weaning O2 for sats > or equal to 88% Outcome: Progressing Goal: Hemodynamically stable Outcome: Progressing Goal: Pain controlled Outcome: Completed/Met Date Met:  05/23/14 Goal: Tolerates progressive ambulation Outcome: Progressing Goal: Tolerating diet Outcome: Progressing Goal: If Diabetic, blood sugar < 150 Outcome: Progressing Goal: Discharge plan established Outcome: Progressing  Problem: Phase III Progression Outcomes Goal: Ambulates with dyspnea controlled Outcome: Progressing Goal: Completes ADLs with minimal assist Outcome: Progressing Goal: O2 sats > 88% on room air Outcome: Progressing Goal: Incision intact & without signs/symptoms of infection Outcome: Progressing Goal: Hemodynamically stable Outcome: Progressing Goal: Pain controlled on oral analgesia Outcome: Completed/Met Date Met:  05/23/14 Goal: Tolerating diet Outcome: Progressing Goal: Transferred to Telemetry Outcome: Progressing Goal: Discharge plan remains appropriate-arrangements made Outcome: Progressing

## 2014-05-23 NOTE — Progress Notes (Signed)
OT Cancellation Note  Patient Details Name: [name] MRN: [id number] DOB: [birth year]   Cancelled Treatment:    Reason Eval/Treat Not Completed: Fatigue/lethargy limiting ability to participate  Benito Mccreedy OTR/L [phone] 05/23/2014, 3:09 PM

## 2014-05-24 ENCOUNTER — Inpatient Hospital Stay (HOSPITAL_COMMUNITY): Payer: Medicaid Other

## 2014-05-24 DIAGNOSIS — J811 Chronic pulmonary edema: Secondary | ICD-10-CM | POA: Insufficient documentation

## 2014-05-24 DIAGNOSIS — J9621 Acute and chronic respiratory failure with hypoxia: Secondary | ICD-10-CM | POA: Insufficient documentation

## 2014-05-24 DIAGNOSIS — J438 Other emphysema: Secondary | ICD-10-CM | POA: Insufficient documentation

## 2014-05-24 DIAGNOSIS — J810 Acute pulmonary edema: Secondary | ICD-10-CM

## 2014-05-24 LAB — BASIC METABOLIC PANEL
ANION GAP: 12 (ref 5–15)
BUN: 20 mg/dL (ref 6–23)
CO2: 29 meq/L (ref 19–32)
Calcium: 9.7 mg/dL (ref 8.4–10.5)
Chloride: 98 mEq/L (ref 96–112)
Creatinine, Ser: 1.49 mg/dL — ABNORMAL HIGH (ref 0.50–1.35)
GFR calc Af Amer: 58 mL/min — ABNORMAL LOW (ref >90)
GFR calc non Af Amer: 50 mL/min — ABNORMAL LOW (ref >90)
Glucose, Bld: 109 mg/dL — ABNORMAL HIGH (ref 70–99)
Potassium: 4.2 mEq/L (ref 3.7–5.3)
SODIUM: 139 meq/L (ref 137–147)

## 2014-05-24 LAB — GLUCOSE, CAPILLARY
GLUCOSE-CAPILLARY: 140 mg/dL — AB (ref 70–99)
GLUCOSE-CAPILLARY: 180 mg/dL — AB (ref 70–99)
Glucose-Capillary: 157 mg/dL — ABNORMAL HIGH (ref 70–99)
Glucose-Capillary: 116 mg/dL — ABNORMAL HIGH (ref 70–99)

## 2014-05-24 LAB — CBC
HCT: 39.4 % (ref 39.0–52.0)
Hemoglobin: 12.8 g/dL — ABNORMAL LOW (ref 13.0–17.0)
MCH: 27.9 pg (ref 26.0–34.0)
MCHC: 32.5 g/dL (ref 30.0–36.0)
MCV: 86 fL (ref 78.0–100.0)
Platelets: 224 10*3/uL (ref 150–400)
RBC: 4.58 MIL/uL (ref 4.22–5.81)
RDW: 14.1 % (ref 11.5–15.5)
WBC: 8 10*3/uL (ref 4.0–10.5)

## 2014-05-24 MED ORDER — BUDESONIDE-FORMOTEROL FUMARATE 160-4.5 MCG/ACT IN AERO
2.0000 | INHALATION_SPRAY | Freq: Two times a day (BID) | RESPIRATORY_TRACT | Status: DC
  Administered 2014-05-24 – 2014-05-25 (×2): 2 via RESPIRATORY_TRACT
  Filled 2014-05-24: qty 6

## 2014-05-24 NOTE — Progress Notes (Signed)
Utilization review completed.  

## 2014-05-24 NOTE — Progress Notes (Signed)
Name: [name] MRN: [id number] DOB: 03-Jan-1956    ADMISSION DATE:  05/18/2014 CONSULTATION DATE:  05/21/2014  REFERRING MD :  Merilynn Finland  CHIEF COMPLAINT:  Hypoxia  BRIEF PATIENT DESCRIPTION:  58 yo male smoker and prior hx of cocaine abuse had Rt nephrectomy for renal cell cancer in August 3762 complicated post-op by hypoxia.  He has been followed by Dr. Chase Caller as outpt.  CT chest from 04/20/14 showed changes possible NSIP.  He was admitted for VATS lung bx.  He had persistent hypoxia post-op and PCCM consulted.  SIGNIFICANT EVENTS  10/30 Lt VATS bx from LUL and LLL  STUDIES:  02/25/14 Echo >> mild LVH, EF 65 to 83%, grade 1 diastolic dysfx 1/51/76 PFT >> FEV1 3.67 (100%), FEV1% 83, TLC 6.11 (87%), DLCO 55%, no BD 04/19/14 CT chest >> b/l non calcified pleural plaques, mild centrilobular/paraseptal emphysema most at apices, patchy areas of GGO 05/04/14 Labs >> HP panel, aldolase, ANCA, SCL 70, SS-a/SS-b, CCP, ds DNA, ANA  >> all negative  SUBJECTIVE:  Feels better.  VITAL SIGNS: Temp:  [98.1 F (36.7 C)-98.9 F (37.2 C)] 98.4 F (36.9 C) (11/05 0740) Pulse Rate:  [65-73] 66 (11/05 0740) Resp:  [12-22] 15 (11/05 0740) BP: (104-128)/(46-66) 115/66 mmHg (11/05 0740) SpO2:  [90 %-98 %] 93 % (11/05 1013) FiO2 (%):  [50 %] 50 % (11/05 0054)  PHYSICAL EXAMINATION: General: no distress Neuro:  Alert, normal strength HEENT:  No sinus tenderness, no oral exudate Cardiovascular:  Regular, no murmur Lungs: decreased BS, no wheeze Abdomen:  Soft, non tender Musculoskeletal:  No edema Skin:  No rashes  CBC Recent Labs     05/22/14  0308  05/23/14  0300  05/24/14  0259  WBC  8.1  7.1  8.0  HGB  12.3*  12.4*  12.8*  HCT  37.0*  38.0*  39.4  PLT  208  211  224    BMET Recent Labs     05/22/14  0308  05/23/14  0300  05/24/14  0259  NA  135*  138  139  K  3.2*  3.7  4.2  CL  93*  97  98  CO2  30  30  29   BUN  18  20  20   CREATININE  1.39*  1.41*   1.49*  GLUCOSE  134*  98  109*    Electrolytes Recent Labs     05/22/14  0308  05/23/14  0300  05/24/14  0259  CALCIUM  9.2  9.6  9.7    Cardiac Enzymes Recent Labs     05/22/14  0308  05/23/14  0300  PROBNP  297.7*  221.3*    Glucose Recent Labs     05/22/14  2114  05/23/14  0756  05/23/14  1225  05/23/14  1710  05/23/14  2106  05/24/14  0740  GLUCAP  199*  130*  220*  124*  158*  140*    Imaging Dg Chest 2 View  05/24/2014   CLINICAL DATA:  Interstitial lung disease  EXAM: CHEST  2 VIEW  COMPARISON:  05/23/2014  FINDINGS: Left pleural effusion unchanged. Left lower lobe airspace disease unchanged. Mild bibasilar scarring unchanged.  Heart size is prominent. Negative for heart failure. No new findings.  IMPRESSION: Left lower lobe with fusion and airspace disease unchanged. No new findings.   Electronically Signed   By: Franchot Gallo M.D.   On: 05/24/2014 08:10   Dg Chest 2 View  05/23/2014   CLINICAL DATA:  Interstitial lung disease, shortness of breath subsequent evaluation  EXAM: CHEST  2 VIEW  COMPARISON:  05/22/2014, 05/21/2014  FINDINGS: Mild cardiac enlargement. Mild interstitial change in the left mid to lower lung zone and mild right lower lobe interstitial change. Blunting of the left costophrenic angle.  IMPRESSION: Further improvement in findings consistent with congestive heart failure with minimal residual interstitial pulmonary edema and small left pleural effusion.   Electronically Signed   By: Skipper Cliche M.D.   On: 05/23/2014 08:08    ASSESSMENT / PLAN:  Acute on chronic hypoxic respiratory failure in setting of presumed ILD, emphysema, ATX, pulmonary edema and sleep disordered breathing.  Hypoxic respiratory failure >> improving. Plan: Oxygen to keep SpO2 > 92% >> baseline home O2 is 4 liters 24/7  Presumed ILD s/p Lt VATS Bx. Plan: Post-op care per primary team  F/u Bx results from 10/30 >>  F/u CXR intermittently  Tobacco abuse with  emphysema on CT chest and diffusion defect on PFT. Plan: Change to symbicort with prn albuterol/ipratropium Needs smoking cessation  Atx. Plan: Bronchial hygiene  Presumed sleep disordered breathing. Plan: Will need to f/u with sleep study as outpt   Pulmonary edema. Plan: Further diuresis per primary team  Summary: His pulmonary regimen is stable for d/c home.  I have scheduled him for pulmonary f/u visit with Tammy Parrett on Thursday, 06/07/14, at 3:30 pm.  Please call if additional help needed while pt is in hospital.  Chesley Mires, MD [place] 05/24/2014, 10:39 AM Pager:  [phone] After 3pm call: [phone]

## 2014-05-24 NOTE — Progress Notes (Signed)
SATURATION QUALIFICATIONS: (This note is used to comply with regulatory documentation for home oxygen)  Patient Saturations on Room Air at Rest = 93%  Patient Saturations on Room Air while Ambulating = 90%  Patient Saturations on 6 Liters of oxygen while Ambulating = 90%  Please briefly explain why patient needs home oxygen:

## 2014-05-24 NOTE — Care Management Note (Signed)
    Page 1 of 2   05/25/2014     11:01:22 AM CARE MANAGEMENT NOTE 05/25/2014  Patient:  [name], [name]   Account Number:  [id number]  Date Initiated:  05/21/2014  Documentation initiated by:  McNally,Kristin  Subjective/Objective Assessment:   pt admit s/p VATS     Action/Plan:   pta pt lived at home. has home 02-base 4l. PT/OT recommend [facility], nurse CM to follow   Anticipated DC Date:  05/25/2014   Anticipated DC Plan:  [place]  CM consult      Choice offered to / List presented to:     DME arranged  NEBULIZER MACHINE  [facility]      DME agency  [place].        Status of service:  Completed, signed off Medicare Important Message given?  NO (If response is "NO", the following Medicare IM given date fields will be blank) Date Medicare IM given:   Medicare IM given by:   Date Additional Medicare IM given:   Additional Medicare IM given by:    Discharge Disposition:  HOME/SELF CARE  Per UR Regulation:  Reviewed for med. necessity/level of care/duration of stay  If discussed at [place] of Stay Meetings, dates discussed:   05/24/2014    Comments:  05/25/14 10:57 am Sharolyn Douglas RN [phone] pt for dc to home today. [facility]. Spoke with [facility] and they have received order for home nebulizer and increased 02 setting to 6l. [facility] to deliver neb today. Family to bring in portable 02 tank at dc, if not [facility] can provide. No other dc needs noted.       05/24/14- 1120- Marvetta Gibbons RN, BSN [phone] Spoke with pt at bedside regarding potential d/c needs- per conversation pt uses [facility] for home 02 needs- baseline is 4L- will need new order for 02-DME if pt requires new liter flow. family to bring 02 with them when they come pick pt up for d/c. Also pt was asking about a nebulizer for home- explained that MD would need to place an order for this- note left in chart regarding  this- per pt plan is to f/u outpt with sleep study and ?bipap needs.  NCM will f/u prior to d/c to check on 02 needs.

## 2014-05-24 NOTE — Progress Notes (Signed)
Occupational Therapy Treatment Patient Details Name: [name] MRN: [id number] DOB: 1956/05/17 Today's Date: 05/24/2014    History of present illness Patient is a 58 yo male with complex medical history now s/p LEFT VIDEO ASSISTED THORACOSCOPY (Left) and LUNG BIOPSY left upper lobe & left lower lobe (Left).   OT comments  Pt making good progress. Pt mobilizing on 6L during functional mobility for ADL with O2 sats 90-93. 1/4 dyspnea.  Pt plans to live with his Mom until able to live independently again. Given history, feel it is appropriate for pt to receive home health services to maximize level of independence within the home, in addition to managing care to reduce risk of readmissions. All further OT to be addressed by [facility].   Follow Up Recommendations  Home health OT;Supervision/Assistance - 24 hour    Equipment Recommendations  None recommended by OT    Recommendations for Other Services      Precautions / Restrictions Precautions Precautions: Fall       Mobility Bed Mobility Overal bed mobility: Modified Independent                Transfers Overall transfer level: Modified independent   Transfers: Sit to/from Stand;Stand Pivot Transfers                Balance     Sitting balance-Leahy Scale: Good     Standing balance support: During functional activity Standing balance-Leahy Scale: Good                     ADL                           Toilet Transfer: Supervision/safety;Ambulation;Regular Toilet;Grab bars           Functional mobility during ADLs: Supervision/safety General ADL Comments: Reviewed energy conservation techniques. O2 remianed 92 6L during mobility for ADL.      Vision                     Perception     Praxis      Cognition   Behavior During Therapy: WFL for tasks assessed/performed Overall Cognitive Status: Within Functional Limits for tasks assessed                        Extremity/Trunk Assessment               Exercises     Shoulder Instructions       General Comments      Pertinent Vitals/ Pain       Pain Assessment: No/denies pain  Home Living                                          Prior Functioning/Environment              Frequency     Progress Toward Goals  OT Goals(current goals can now be found in the care plan section)  Progress towards OT goals: Acute OT goals met. All further Ot to be addressed by HHOT.  Acute Rehab OT Goals Patient Stated Goal: to go home OT Goal Formulation: With patient Time For Goal Achievement: 05/28/14 Potential to Achieve Goals: Good ADL Goals Pt Will Perform Upper Body Bathing: with set-up;sitting Pt Will Perform Lower Body Bathing: with set-up;sit  to/from stand Pt Will Perform Lower Body Dressing: with set-up;sit to/from stand Pt Will Transfer to Toilet: with supervision;ambulating Additional ADL Goal #1: Pt will independently verbalize and demonstrate 3/3 energy conservation techniques.  Plan Discharge plan remains appropriate    Co-evaluation                 End of Session Equipment Utilized During Treatment: Gait belt;Oxygen (6L)   Activity Tolerance Patient tolerated treatment well   Patient Left in chair;with call bell/phone within reach   Nurse Communication Mobility status        Time: 6433-2951 OT Time Calculation (min): 20 min  Charges: OT General Charges $OT Visit: 1 Procedure OT Treatments $Self Care/Home Management : 8-22 mins  Nirel Babler,HILLARY 05/24/2014, 4:52 PM   [facility], OTR/L  [phone] 05/24/2014

## 2014-05-24 NOTE — Progress Notes (Signed)
Patient currently on 6 lpm nasal cannula.  Patient states he was not able to tolerate BIPAP any longer at this time.  No distress noted.

## 2014-05-24 NOTE — Progress Notes (Signed)
       [address]       [place] [address]             [phone]          6 Days Post-Op Procedure(s) (LRB): LEFT VIDEO ASSISTED THORACOSCOPY (Left) LUNG BIOPSY left upper lobe & left lower lobe (Left) VIDEO BRONCHOSCOPY  Subjective: Stable night, no new complaints.  Still no BM, but took laxative and passing flatus this am.  Did not walk yesterday.   Objective: Vital signs in last 24 hours: Patient Vitals for the past 24 hrs:  BP Temp Temp src Pulse Resp SpO2  05/24/14 0449 118/62 mmHg 98.3 F (36.8 C) Axillary 69 12 93 %  05/24/14 0315 - - - 69 - 90 %  05/24/14 0054 (!) 118/57 mmHg 98.5 F (36.9 C) Axillary 73 16 94 %  05/23/14 2332 - - - 69 (!) 22 98 %  05/23/14 2104 (!) 104/46 mmHg 98.9 F (37.2 C) Oral 72 (!) 21 91 %  05/23/14 1937 - - - - - 93 %  05/23/14 1719 - - - - - 93 %  05/23/14 1646 108/64 mmHg - - 69 - -  05/23/14 1500 - 98.1 F (36.7 C) Oral - - -  05/23/14 1100 - 98.2 F (36.8 C) Oral - - -  05/23/14 1048 128/66 mmHg - - 65 13 91 %  05/23/14 0830 - - - - - 93 %  05/23/14 0821 - - - - - 96 %   Current Weight  05/18/14 232 lb (105.235 kg)     Intake/Output from previous day: 11/04 0701 - 11/05 0700 In: 240 [P.O.:240] Out: 1375 [Urine:1375] CBGs [phone]   PHYSICAL EXAM:  Heart: RRR Lungs: Decreased BS bilaterally Wound: Clean and dry   Lab Results: CBC: Recent Labs  05/23/14 0300 05/24/14 0259  WBC 7.1 8.0  HGB 12.4* 12.8*  HCT 38.0* 39.4  PLT 211 224   BMET:  Recent Labs  05/23/14 0300 05/24/14 0259  NA 138 139  K 3.7 4.2  CL 97 98  CO2 30 29  GLUCOSE 98 109*  BUN 20 20  CREATININE 1.41* 1.49*  CALCIUM 9.6 9.7    PT/INR: No results for input(s): LABPROT, INR in the last 72 hours.    Assessment/Plan: S/P Procedure(s) (LRB): LEFT VIDEO ASSISTED THORACOSCOPY (Left) LUNG BIOPSY left upper lobe & left lower lobe (Left) VIDEO BRONCHOSCOPY  Pulm- Continue aggressive pulm toilet, nebs.  Appreciate pulm/CCM assistance. Presently weaned down to 6L. He is on 4L O2 at home. Surgical path remains pending, cx's negative.  GU-Voiding without difficulty after Foley d/c'ed. Continue Flomax, Vesicare.  Renal- Cr continues to trend up, may need to hold Lasix today and watch.  Acute/chronic systolic/diastolic HF- Stable at present, diuresed well over last few days.  Will hold Lasix today and watch Cr.   Deconditioning- Improving with mobility. Continue PT/OT.  DM- sugars generally stable, continue home meds.  GI- received LOC this am.  Hopefully will have BM today.  Overall stable from surgical standpoint. Home once ok with pulmonary/CCM.  LOS: 6 days    Daniesha Driver H 05/24/2014

## 2014-05-24 NOTE — Progress Notes (Signed)
Physical Therapy Treatment Patient Details Name: [name] MRN: [id number] DOB: 06-30-1956 Today's Date: 05/24/2014    History of Present Illness Patient is a 58 yo male with complex medical history now s/p LEFT VIDEO ASSISTED THORACOSCOPY (Left) and LUNG BIOPSY left upper lobe & left lower lobe (Left).    PT Comments    Ambulated 38' with RW and 6 liters [place], tolerated well. SpO2 >90% throughout duration of ambulation, VCs for nasal inhalation brought SpO2  up to 93% with ambulation.  Patient denies SOB or DOE, able to conversate upon return to room without evidence of dyspnea. Overall progressing well.   Follow Up Recommendations  Home health PT;Supervision/Assistance - 24 hour     Equipment Recommendations  Rolling walker with 5" wheels    Recommendations for Other Services       Precautions / Restrictions Precautions Precautions: Fall (SOB) Restrictions Weight Bearing Restrictions: No    Mobility  Bed Mobility Overal bed mobility: Modified Independent                Transfers Overall transfer level: Needs assistance Equipment used: None Transfers: Sit to/from Stand Sit to Stand: Supervision         General transfer comment: Min guard for safety. Pt with mild sway upon standing but able to self correct. Improved with UE support on RW.  Ambulation/Gait Ambulation/Gait assistance: Supervision Ambulation Distance (Feet): 370 Feet Assistive device: Rolling walker (2 wheeled) Gait Pattern/deviations: Step-through pattern;Decreased stride length;Scissoring;Narrow base of support   Gait velocity interpretation: Below normal speed for age/gender General Gait Details: VCs for relaxed grip on RW, SpO2 >90% throughout duration of ambulation, VCs for nasal inhalation brought SpO2  up to 93% with ambulation.  Patient denies SOB or DOE, able to conversate upon return to room without evidence of dyspnea. Overall progressing well.   Stairs             Wheelchair Mobility    Modified Rankin (Stroke Patients Only)       Balance     Sitting balance-Leahy Scale: Good       Standing balance-Leahy Scale: Fair                      Cognition Arousal/Alertness: Awake/alert Behavior During Therapy: WFL for tasks assessed/performed Overall Cognitive Status: Within Functional Limits for tasks assessed                      Exercises      General Comments        Pertinent Vitals/Pain      Home Living                      Prior Function            PT Goals (current goals can now be found in the care plan section) Acute Rehab PT Goals PT Goal Formulation: With patient/family Time For Goal Achievement: 06/03/14 Potential to Achieve Goals: Good Progress towards PT goals: Progressing toward goals    Frequency  Min 3X/week    PT Plan Current plan remains appropriate    Co-evaluation             End of Session Equipment Utilized During Treatment: Gait belt;Oxygen (6 liters [place]) Activity Tolerance: Patient tolerated treatment well Patient left: in chair;with call bell/phone within reach     Time: 6295-2841 PT Time Calculation (min): 18 min  Charges:  $Gait Training: 8-22 mins  G CodesDuncan Dull Jun 04, 2014, 8:58 AM Alben Deeds, PT DPT  [phone]

## 2014-05-25 ENCOUNTER — Inpatient Hospital Stay (HOSPITAL_COMMUNITY): Payer: Medicaid Other

## 2014-05-25 LAB — BASIC METABOLIC PANEL
ANION GAP: 14 (ref 5–15)
BUN: 19 mg/dL (ref 6–23)
CALCIUM: 9.5 mg/dL (ref 8.4–10.5)
CO2: 26 mEq/L (ref 19–32)
Chloride: 97 mEq/L (ref 96–112)
Creatinine, Ser: 1.37 mg/dL — ABNORMAL HIGH (ref 0.50–1.35)
GFR calc non Af Amer: 55 mL/min — ABNORMAL LOW (ref >90)
GFR, EST AFRICAN AMERICAN: 64 mL/min — AB (ref >90)
Glucose, Bld: 128 mg/dL — ABNORMAL HIGH (ref 70–99)
Potassium: 4.2 mEq/L (ref 3.7–5.3)
Sodium: 137 mEq/L (ref 137–147)

## 2014-05-25 LAB — GLUCOSE, CAPILLARY: GLUCOSE-CAPILLARY: 150 mg/dL — AB (ref 70–99)

## 2014-05-25 MED ORDER — TAMSULOSIN HCL 0.4 MG PO CAPS: 0.4000 mg | ORAL_CAPSULE | Freq: Every day | ORAL | Status: DC

## 2014-05-25 MED ORDER — BUDESONIDE-FORMOTEROL FUMARATE 160-4.5 MCG/ACT IN AERO: 2.0000 | INHALATION_SPRAY | Freq: Two times a day (BID) | RESPIRATORY_TRACT | Status: DC

## 2014-05-25 MED ORDER — OXYCODONE HCL 5 MG PO TABS: 5.0000 mg | ORAL_TABLET | ORAL | Status: DC | PRN

## 2014-05-25 NOTE — Progress Notes (Addendum)
       [address]       [place] [address]             [phone]          7 Days Post-Op Procedure(s) (LRB): LEFT VIDEO ASSISTED THORACOSCOPY (Left) LUNG BIOPSY left upper lobe & left lower lobe (Left) VIDEO BRONCHOSCOPY  Subjective: Comfortable, breathing stable.  Objective: Vital signs in last 24 hours: Patient Vitals for the past 24 hrs:  BP Temp Temp src Pulse Resp SpO2  05/25/14 0443 126/69 mmHg - - (!) 55 19 94 %  05/25/14 0441 - 97.5 F (36.4 C) Oral - - -  05/25/14 0115 - - - 69 18 93 %  05/24/14 2348 (!) 110/48 mmHg 98.4 F (36.9 C) Oral 73 18 90 %  05/24/14 2112 - - - 69 19 92 %  05/24/14 2011 - 98 F (36.7 C) Oral - - -  05/24/14 2007 (!) 116/57 mmHg - - 65 (!) 21 (!) 87 %  05/24/14 1617 100/63 mmHg 98.1 F (36.7 C) Oral 68 16 91 %  05/24/14 1102 135/60 mmHg 98.2 F (36.8 C) Oral 66 13 93 %  05/24/14 1013 - - - - - 93 %   Current Weight  05/18/14 232 lb (105.235 kg)     Intake/Output from previous day: 11/05 0701 - 11/06 0700 In: 720 [P.O.:720] Out: 800 [Urine:800] CBGs [phone]   PHYSICAL EXAM:  Heart: RRR Lungs: Diminished BS bilaterally Wound: Clean and dry    Lab Results: CBC: Recent Labs  05/23/14 0300 05/24/14 0259  WBC 7.1 8.0  HGB 12.4* 12.8*  HCT 38.0* 39.4  PLT 211 224   BMET:  Recent Labs  05/24/14 0259 05/25/14 0209  NA 139 137  K 4.2 4.2  CL 98 97  CO2 29 26  GLUCOSE 109* 128*  BUN 20 19  CREATININE 1.49* 1.37*  CALCIUM 9.7 9.5    PT/INR: No results for input(s): LABPROT, INR in the last 72 hours.  YFV:CBSWHQPR: There is no interval change in the chest. The cardiopericardial silhouette is within normal limits, unchanged from prior. Monitoring leads project over the chest. Small LEFT pleural effusion remains present. When compared to prior exam 03/21/2014, airspace disease, atelectasis and effusion at the LEFT base are new. Lung staples noted at the LEFT apex.  IMPRESSION: No  interval change. Persistent LEFT basilar airspace disease, atelectasis and small LEFT pleural effusion. This likely represents pneumonia.   Assessment/Plan: S/P Procedure(s) (LRB): LEFT VIDEO ASSISTED THORACOSCOPY (Left) LUNG BIOPSY left upper lobe & left lower lobe (Left) VIDEO BRONCHOSCOPY Stable from surgical and pulmonary standpoint. Plan d/c home today - instructions reviewed with patient.   LOS: 7 days    COLLINS,GINA H 05/25/2014   Chart reviewed, patient examined, agree with above.

## 2014-05-25 NOTE — Plan of Care (Signed)
Problem: Phase II Progression Outcomes Goal: Incision intact & without signs/symptoms of infection Outcome: Completed/Met Date Met:  05/25/14 Goal: Weaning O2 for sats > or equal to 88% Outcome: Adequate for Discharge Goal: Hemodynamically stable Outcome: Adequate for Discharge Goal: Tolerates progressive ambulation Outcome: Adequate for Discharge Goal: Tolerating diet Outcome: Completed/Met Date Met:  05/25/14 Goal: If Diabetic, blood sugar < 150 Outcome: Adequate for Discharge Goal: Discharge plan established Outcome: Completed/Met Date Met:  05/25/14  Problem: Phase III Progression Outcomes Goal: Ambulates with dyspnea controlled Outcome: Adequate for Discharge Goal: Completes ADLs with minimal assist Outcome: Completed/Met Date Met:  05/25/14 Goal: O2 sats > 88% on room air Outcome: Adequate for Discharge Goal: Incision intact & without signs/symptoms of infection Outcome: Completed/Met Date Met:  05/25/14 Goal: Hemodynamically stable Outcome: Adequate for Discharge Goal: Tolerating diet Outcome: Completed/Met Date Met:  05/25/14 Goal: Transferred to Telemetry Outcome: Not Applicable Date Met:  33/82/50 Goal: Discharge plan remains appropriate-arrangements made Outcome: Completed/Met Date Met:  05/25/14

## 2014-05-25 NOTE — Progress Notes (Signed)
   05/25/14 0115  BiPAP/CPAP/SIPAP  BiPAP/CPAP/SIPAP Pt Type Adult  Mask Type Full face mask  Mask Size Large  Respiratory Rate 18 breaths/min  IPAP (I MAX 22)  EPAP (E MIN 8)  Flow Rate 6 lpm  BiPAP/CPAP/SIPAP CPAP  Patient Home Equipment No  Auto Titrate No  BiPAP/CPAP /SiPAP Vitals  Pulse Rate 69  Resp 18  SpO2 93 %

## 2014-05-25 NOTE — Progress Notes (Signed)
Discussed AVS/discharge instructions with pt. Prescriptions given and IV removed. Pt verbalizes understanding. Pt's brother at bedside with home O2 for pt. CCDM and Elink notified of discharge.

## 2014-05-27 ENCOUNTER — Telehealth: Payer: Self-pay | Admitting: Internal Medicine

## 2014-05-27 NOTE — Telephone Encounter (Signed)
[name]  He  Needs fu post lung bx with me < 2 -3 weeks; prefer with me. IF no slot avail, let me know  Thanks  Dr. Brand Males, M.D., [facility].C.P Pulmonary and Critical Care Medicine Staff Physician [place] [facility] Pager: [phone], If no answer or between  15:00h - 7:00h: call [phone]  05/27/2014 5:23 PM   g

## 2014-05-28 NOTE — Telephone Encounter (Signed)
Pt has appt with TP on 06/07/14 for HFU. Nothing further needed.

## 2014-05-28 NOTE — Telephone Encounter (Signed)
Yes please give the TP and I will let her know what to do with bx results  Thanks  Dr. Brand Males, M.D., [facility].C.P Pulmonary and Critical Care Medicine Staff Physician [place] [facility] Pager: [phone], If no answer or between  15:00h - 7:00h: call [phone]  05/28/2014 10:35 AM

## 2014-05-28 NOTE — Telephone Encounter (Signed)
There are no available openings within this time frame. Do you want me to book with TP?

## 2014-05-31 ENCOUNTER — Encounter (HOSPITAL_COMMUNITY): Payer: Self-pay

## 2014-05-31 ENCOUNTER — Other Ambulatory Visit: Payer: Self-pay | Admitting: *Deleted

## 2014-05-31 ENCOUNTER — Encounter (INDEPENDENT_AMBULATORY_CARE_PROVIDER_SITE_OTHER): Payer: Self-pay

## 2014-05-31 DIAGNOSIS — R911 Solitary pulmonary nodule: Secondary | ICD-10-CM

## 2014-05-31 DIAGNOSIS — G8918 Other acute postprocedural pain: Secondary | ICD-10-CM

## 2014-05-31 MED ORDER — HYDROCODONE-ACETAMINOPHEN 5-325 MG PO TABS: 1.0000 | ORAL_TABLET | Freq: Four times a day (QID) | ORAL | Status: DC | PRN

## 2014-06-04 ENCOUNTER — Other Ambulatory Visit: Payer: Self-pay | Admitting: Thoracic Surgery (Cardiothoracic Vascular Surgery)

## 2014-06-04 DIAGNOSIS — J849 Interstitial pulmonary disease, unspecified: Secondary | ICD-10-CM

## 2014-06-05 ENCOUNTER — Encounter: Payer: Self-pay | Admitting: Thoracic Surgery (Cardiothoracic Vascular Surgery)

## 2014-06-05 ENCOUNTER — Ambulatory Visit
Admission: RE | Admit: 2014-06-05 | Discharge: 2014-06-05 | Disposition: A | Discharge status: Home / Self Care | Payer: Medicaid Other | Source: Ambulatory Visit | Attending: Thoracic Surgery (Cardiothoracic Vascular Surgery) | Admitting: Thoracic Surgery (Cardiothoracic Vascular Surgery)

## 2014-06-05 ENCOUNTER — Ambulatory Visit (INDEPENDENT_AMBULATORY_CARE_PROVIDER_SITE_OTHER): Payer: Self-pay | Admitting: Thoracic Surgery (Cardiothoracic Vascular Surgery)

## 2014-06-05 VITALS — BP 112/69 | HR 80 | Resp 20 | Ht 69.0 in | Wt 232.0 lb

## 2014-06-05 DIAGNOSIS — J849 Interstitial pulmonary disease, unspecified: Secondary | ICD-10-CM

## 2014-06-05 NOTE — Progress Notes (Signed)
HPI:  [name] is a 58 year old man who underwent a thoracoscopic left lung biopsy for possible interstitial disease on 05/18/2014.  His postoperative course was uncomplicated. He was discharged on oxygen. He had been on oxygen prior to discharge. He originally was taking oxycodone for pain, but that made him feel very anxious. He was switched to hydrocodone, which he tolerated well. He has only used 3 or 4 of those since he was given the prescription. He does complain of pain and redness at the chest tube site.    Past Medical History  Diagnosis Date  . Essential hypertension, benign   . TIA (transient ischemic attack) 1997    No residual neurological deficits.  . Noncompliance   . COPD (chronic obstructive pulmonary disease)   . Hyperlipidemia   . History of pneumonia   . Type 2 diabetes mellitus 2011  . GERD (gastroesophageal reflux disease)   . Substance abuse   . Cocaine abuse     02-14-14 dicusses freely with medical team, prefers no discussions with family present.  . Renal cell carcinoma     Status post radical nephrectomy August 2015  . Cholecystitis, acute 12/20/2013    Lap chole 6/5  . Coronary atherosclerosis of native coronary artery     a. 03/09/2013 Cath/PCI: LM nl, LAD: 50p, 42m (2.5x16 promus DES), LCX nl, OM1 min irregs, LPL/LPDA diff dzs, RCA nondom, mod diff dzs, EF 55%.  . Depression   . Anxiety   . History of nephrolithiasis   . Arthritis   . Hepatitis Late 1970s  . Complication of anesthesia     used Cpap with last surgery  . Shortness of breath   . Sleep apnea     on 4l      Current Outpatient Prescriptions  Medication Sig Dispense Refill  . albuterol (PROVENTIL HFA;VENTOLIN HFA) 108 (90 BASE) MCG/ACT inhaler Inhale 2 puffs into the lungs every 6 (six) hours as needed for wheezing or shortness of breath.    [name] albuterol (PROVENTIL) (2.5 MG/3ML) 0.083% nebulizer solution Take 3 mLs (2.5 mg total) by nebulization every 3 (three) hours as needed for  wheezing or shortness of breath. 75 mL 12  . alfuzosin (UROXATRAL) 10 MG 24 hr tablet Take 10 mg by mouth daily with breakfast.    . aspirin 81 MG tablet Take 81 mg by mouth daily.    . budesonide-formoterol (SYMBICORT) 160-4.5 MCG/ACT inhaler Inhale 2 puffs into the lungs 2 (two) times daily. 1 Inhaler 3  . citalopram (CELEXA) 20 MG tablet Take 20 mg by mouth daily.    [name] diltiazem (CARDIZEM CD) 240 MG 24 hr capsule Take 240 mg by mouth every morning.    . Fish Oil-Cholecalciferol (FISH OIL + D3 PO) Take 1 capsule by mouth 4 (four) times daily.     [name] FLUoxetine (PROZAC) 10 MG capsule Take 10 mg by mouth every morning.    . furosemide (LASIX) 40 MG tablet Take 1 tablet (40 mg total) by mouth daily. 30 tablet 0  . gabapentin (NEURONTIN) 300 MG capsule Take 300 mg by mouth at bedtime.    [name] glipiZIDE (GLUCOTROL) 10 MG tablet Take 10 mg by mouth 2 (two) times daily.    [name] HYDROcodone-acetaminophen (NORCO/VICODIN) 5-325 MG per tablet Take 1 tablet by mouth every 6 (six) hours as needed for moderate pain. 40 tablet 0  . ipratropium-albuterol (DUONEB) 0.5-2.5 (3) MG/3ML SOLN Take 3 mLs by nebulization every 6 (six) hours as needed (Shortness of breath).    [name]  isosorbide mononitrate (IMDUR) 60 MG 24 hr tablet Take 60 mg by mouth every morning.     . nitroGLYCERIN (NITROSTAT) 0.4 MG SL tablet Place 1 tablet (0.4 mg total) under the tongue every 5 (five) minutes as needed for chest pain. 30 tablet 1  . ranitidine (ZANTAC) 300 MG tablet Take 300 mg by mouth every morning.     . rosuvastatin (CRESTOR) 20 MG tablet Take 20 mg by mouth daily.    . solifenacin (VESICARE) 5 MG tablet Take 5 mg by mouth daily.    . tamsulosin (FLOMAX) 0.4 MG CAPS capsule Take 1 capsule (0.4 mg total) by mouth daily after supper. 30 capsule 1   No current facility-administered medications for this visit.    Physical Exam BP 112/69 mmHg  Pulse 80  Resp 20  Ht 5\' 9"  (1.753 m)  Wt 232 lb (105.235 kg)  BMI 34.24 kg/m2  SpO54  45% 58 year old man in no acute distress Nasal cannula oxygen in place Left chest incision well-healed. Chest tube site with the erythema and a small amount of eschar  Diagnostic Tests: CHEST 2 VIEW  COMPARISON: PA and lateral chest of May 25, 2014  FINDINGS: The right lung is adequately inflated and clear. On the left there is stable volume loss inferior-laterally. Blunting of the costophrenic angles on the left is present consistent with a small stable pleural effusion. The interstitial markings over the mid and lower left hemi thorax are slightly more conspicuous today. The heart is normal in size. The central pulmonary vascularity is mildly prominent though stable. The bony thorax exhibits no acute abnormality.  IMPRESSION: There are persistent changes of presumed scarring and pleural effusion at the left lung base. Slightly increased interstitial markings have developed in the mid and lower left lung which may reflect mild interstitial edema or interstitial pneumonia.   Electronically Signed  By: David [name]  On: 06/05/2014 15:56  Pathology Diagnosis Lung, left lower and upper lobes, wedge biopsies. 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 Minute focus (2 mm) of well differentiated mucinous carcinoma in situ, completely excised  Impression: 58 year old gentleman who is now about 3 weeks out from thoracoscopic lung biopsy. Pathology showed desquamative interstitial pneumonia.  From a surgical standpoint he's doing well. He is not requiring much pain medication. His incision looks good but he does have some local irritation at the chest tube site. I advised him to put Neosporin or another over-the-counter antibiotic ointment on that daily.  He will follow-up with Dr. Chase Caller to discuss the pathology results and potential treatment  Plan:  I will be happy to see [name] back to be  of any further assistance with his care

## 2014-06-06 ENCOUNTER — Telehealth: Payer: Self-pay | Admitting: Internal Medicine

## 2014-06-06 NOTE — Telephone Encounter (Signed)
Elise  Lung bx reportss show DIP and Burnt out Langerhans cell histiotycytes. Both dz of smoking.  He NEEDS TO QUIT. Pleease have him see Tammy PArrett for intense smoking cessatin counseling and post op followup  Last noted as  reports that he has been smoking Cigarettes.  He started smoking about 42 years ago. He has a 20.5 pack-year smoking history. He has never used smokeless tobacco.   Thanks  Dr. Brand Males, M.D., [facility].C.P Pulmonary and Critical Care Medicine Staff Physician [place] [facility] Pager: [phone], If no answer or between  15:00h - 7:00h: call [phone]  06/06/2014 6:06 AM

## 2014-06-07 ENCOUNTER — Ambulatory Visit (INDEPENDENT_AMBULATORY_CARE_PROVIDER_SITE_OTHER): Payer: Medicaid Other | Admitting: Adult Health

## 2014-06-07 ENCOUNTER — Encounter: Payer: Self-pay | Admitting: Adult Health

## 2014-06-07 VITALS — BP 116/64 | HR 82 | Temp 97.0°F | Ht 69.0 in | Wt 233.2 lb

## 2014-06-07 DIAGNOSIS — J9611 Chronic respiratory failure with hypoxia: Secondary | ICD-10-CM

## 2014-06-07 DIAGNOSIS — J849 Interstitial pulmonary disease, unspecified: Secondary | ICD-10-CM

## 2014-06-07 NOTE — Progress Notes (Signed)
Subjective:    Patient ID: [name], male    DOB: 02-Oct-1955, 58 y.o.   MRN: [id number]  HPI   OV  03/21/2014  Chief Complaint  Patient presents with  . Follow-up    HFU- Pt was d/c from [facility] for acute respiratory failure.    58 year old male recently hospitalized for nephrectomy for renal cell carcinoma and in the postoperative phase had acute respiratory failure with possible postoperative pneumonia. He was seen by inpatient pulmonary. He was discharged approximately 3 weeks ago. Since then he says he is feeling better. He continues to wear oxygen. Preadmission he was not wearing oxygen. He uses Flovent inhaler. I do not see any other inhaler with him. However he has all his medications in a bag and he seems to be compliant with it. He notes that his respiratory status is stable. He denies prior existence of COPD. Currently denies any chest pain.  Most of the symptoms this visit focused on the fact that he has chronic excessive daytime somnolence. Unclear if he has a baseline history of snoring because he lives alone. He also complains of chronic urinary problems after his surgery. Denies any cough   reports that he has been smoking Cigarettes.  He has a 20.5 pack-year smoking history. He has never used smokeless tobacco.  REC Glad you are better from breathing stand point Refer sleep doctor to ensure sleep apnea ruled out Do CXR 2 view today  03/21/2014; will call with results Do full PFT breathing test next few weeks Return to see my NP [name] after that for med calendar and followup visit Til then continue your inhalers  OV 04/13/2014  Chief Complaint  Patient presents with  . Follow-up    Pt states his breathing is unchanged since last OV. Pt states he is wearing nocturnal O2 at 4lpm and it is helping. Pt c/o DOE, dry cough. Pt denies CP/tightness.    FU post hospital dyspnea   - Poor historian. Says better but then also reports class 2-3 dyspnea on exertion relieved by  rest. NEbs and flovent and night o2 not helping. There is ass CAD and he does have exertional chest pain; has appt due with cards mid-oct 2015. Walking deat: 2nd lap of 185 feet - dyspneic, and desaturated 04/13/2014. Denies active wheeze or cough or hemoptysis or edema or fever or chills. PFT 04/13/2014 review and only has isolated low DLCO 55%.     Chief Complaint  Patient presents with  . Follow-up    review CT results. Pt denies any increase in SOB at this time.     Here to review results of dyspnea workup. CT chest 04/19/14 as below   - CT chest or [name] shows   - early ILD. NSIP V Possibe UIP pattern of ILD   -  In addition, there are patchy  areas of peripheral and peribronchovascular ground-glass attenuation which are very mild, with some scattered areas of very mild  subpleural reticulation. No traction bronchiectasis or frank  honeycombing noted at this time. Inspiratory and expiratory images  demonstrate minimal air trapping in the lungs bilaterally. No suspicious appearing pulmonary nodules or masses. No acute  consolidative airspace disease.  - calcified coronaries +  - normal esophgus +  -  There are subtle noncalcified pleural plaques  throughout the thorax bilaterally. No definite calcified pleural  plaques are identified at this time.   -  mild centrilobular and paraseptal emphysema, most  evident the lung  apices bilaterally.     He has seen cardiology: they have cleared him for surgical lung biopsy if he needs one ; see Dr [name] note 05/03/14. He has no new complaints. Dyspnea still +. Using o2 and it helps. Sleep apnea eval pending   Exposure hx and ILD relevant hx   - dyspnea for 1 yearfor 100 yards and dry cough x 3 months  - Smoking:   reports that he has been smoking Cigarettes.  He started smoking about 42 years ago. He has a 20.5 pack-year smoking history. He has never used smokeless tobacco.  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 Denies farm work, Curator, Educational psychologist, Psychiatric nurse, Pension scheme manager, Building control surveyor, Forensic psychologist, Personal assistant, Engineer, maintenance (IT), mine, [place], [employer], Teacher, adult education, Customer service manager, foundry, rail road, paper pill, smelting, tunnel work   - Fam hx : posive for copd, pulmonary fibrosis (unclear who) and asthma  - Home xpo: deneies sauran, Natalia, hot tub, birds  - Autoimmune - none so far  has a past medical history of Essential hypertension, benign; TIA (transient ischemic attack) (1997); Noncompliance; COPD (chronic obstructive pulmonary disease); Hyperlipidemia; History of pneumonia; Type 2 diabetes mellitus (2011); GERD (gastroesophageal reflux disease); Substance abuse; Cocaine abuse; Renal cell carcinoma; Cholecystitis, acute (12/20/2013); Coronary atherosclerosis of native coronary artery; Depression; Anxiety; History of nephrolithiasis; Arthritis; and Hepatitis (Late 1970s).  06/07/2014 [facility] follow up  Pt underwent a thoracoscopic left lung biopsy for possible interstitial disease on 05/18/2014.  Was seen by [facility] on 11/17 with chronic changes on cxr .  No hemoptysis or edema .  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 Minute focus (2 mm) of well differentiated mucinous carcinoma in situ, completely excised We discussed his results Breathing is at baseline  No flare in cough or increased O2 demand.  Had cut down on smoking, smoking cessation discussed.    Review of Systems  Constitutional: Negative for fever and unexpected weight change.  HENT: Negative for congestion, dental problem, ear pain, nosebleeds, postnasal drip, rhinorrhea, sinus pressure, sneezing, sore throat and trouble swallowing.   Eyes: Negative for redness and itching.  Respiratory: Positive for shortness of breath. Negative for cough, chest tightness and wheezing.    Cardiovascular: Negative for palpitations and leg swelling.  Gastrointestinal: Negative for nausea and vomiting.  Genitourinary: Negative for dysuria.  Musculoskeletal: Negative for joint swelling.  Skin: Negative for rash.  Neurological: Negative for headaches.  Hematological: Does not bruise/bleed easily.  Psychiatric/Behavioral: Negative for dysphoric mood. The patient is not nervous/anxious.        Objective:  GEN: A/Ox3; pleasant , NAD, elderly   HEENT:  [place]/AT,  EACs-clear, TMs-wnl, NOSE-clear, THROAT-clear, no lesions, no postnasal drip or exudate noted.   NECK:  Supple w/ fair ROM; no JVD; normal carotid impulses w/o bruits; no thyromegaly or nodules palpated; no lymphadenopathy.  RESP  Clear  P & A; w/o, wheezes/ rales/ or rhonchi.no accessory muscle use, no dullness to percussion  CARD:  RRR, no m/r/g  , tr  peripheral edema, pulses intact, no cyanosis or clubbing.  GI:   Soft & nt; nml bowel sounds; no organomegaly or masses detected.  Musco: Warm bil, no deformities or joint swelling noted.   Neuro: alert, no focal deficits noted.    Skin: Warm, no lesions or rashes

## 2014-06-07 NOTE — Assessment & Plan Note (Signed)
Remains O2 dependent  Cont on O2 to keep sat >90% adivsed on dangers of O2 /and smoking

## 2014-06-07 NOTE — Patient Instructions (Addendum)
Most important goal is to quit smoking  Continue on current regimen  follow up Dr. Chase Caller in 2 months and As needed

## 2014-06-07 NOTE — Assessment & Plan Note (Signed)
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 Minute focus (2 mm) of well differentiated mucinous carcinoma in situ, completely excised >smoking cessation discussed -that is his main goal  Will follow up CT 04/2015 >

## 2014-06-07 NOTE — Telephone Encounter (Signed)
Pt had f/u with TP on 06/07/14. Nothing further needed.

## 2014-06-08 ENCOUNTER — Telehealth: Payer: Self-pay | Admitting: Adult Health

## 2014-06-08 NOTE — Telephone Encounter (Signed)
done

## 2014-06-08 NOTE — Telephone Encounter (Signed)
Tammy, did you call this pt? Please advise thanks

## 2014-06-11 LAB — FUNGUS CULTURE W SMEAR: Fungal Smear: NONE SEEN

## 2014-06-13 ENCOUNTER — Ambulatory Visit: Payer: Medicaid Other | Admitting: Adult Health

## 2014-06-16 LAB — FUNGUS CULTURE W SMEAR
FUNGAL SMEAR: NONE SEEN
FUNGAL SMEAR: NONE SEEN

## 2014-06-18 ENCOUNTER — Telehealth: Payer: Self-pay | Admitting: *Deleted

## 2014-06-18 DIAGNOSIS — G8918 Other acute postprocedural pain: Secondary | ICD-10-CM

## 2014-06-18 MED ORDER — HYDROCODONE-ACETAMINOPHEN 5-325 MG PO TABS: 1.0000 | ORAL_TABLET | Freq: Four times a day (QID) | ORAL | Status: DC | PRN

## 2014-06-18 NOTE — Telephone Encounter (Signed)
[name] has called requesting a pain med refill s/p thoracic surgery. I informed him that a new script would be available this afternoon at our office and he understood.

## 2014-06-28 ENCOUNTER — Encounter (HOSPITAL_COMMUNITY): Payer: Self-pay | Admitting: Cardiology

## 2014-07-01 LAB — AFB CULTURE WITH SMEAR (NOT AT ARMC)
ACID FAST SMEAR: NONE SEEN
Acid Fast Smear: NONE SEEN
Acid Fast Smear: NONE SEEN

## 2014-07-16 ENCOUNTER — Ambulatory Visit (HOSPITAL_BASED_OUTPATIENT_CLINIC_OR_DEPARTMENT_OTHER): Payer: Medicaid Other | Attending: Pulmonary Disease | Admitting: Radiology

## 2014-07-16 VITALS — Ht 72.0 in | Wt 233.0 lb

## 2014-07-16 DIAGNOSIS — G4733 Obstructive sleep apnea (adult) (pediatric): Secondary | ICD-10-CM | POA: Diagnosis present

## 2014-07-16 DIAGNOSIS — R0683 Snoring: Secondary | ICD-10-CM | POA: Diagnosis not present

## 2014-07-18 ENCOUNTER — Other Ambulatory Visit: Payer: Self-pay

## 2014-07-18 DIAGNOSIS — G8918 Other acute postprocedural pain: Secondary | ICD-10-CM

## 2014-07-18 MED ORDER — HYDROCODONE-ACETAMINOPHEN 5-325 MG PO TABS: 1.0000 | ORAL_TABLET | Freq: Two times a day (BID) | ORAL | Status: DC | PRN

## 2014-07-18 NOTE — Telephone Encounter (Signed)
RX refill on Hydrocodone 5/325 mg 1 tab every 8- 12 hours prn for severe pain no refills printed out and pt will pick up today. He was advised that was the last pain RX. If pain continues will need to see the MD.

## 2014-07-30 ENCOUNTER — Telehealth: Payer: Self-pay | Admitting: Pulmonary Disease

## 2014-07-30 DIAGNOSIS — G4733 Obstructive sleep apnea (adult) (pediatric): Secondary | ICD-10-CM

## 2014-07-30 NOTE — Sleep Study (Signed)
[place]  NAME: [name] DATE OF BIRTH:  18-Apr-1956 MEDICAL RECORD NUMBER [id number]  [facility]  PHYSICIAN: Chesley Mires, M.D. DATE OF STUDY: 07/16/2014  SLEEP STUDY TYPE: Polysomnogram               REFERRING PHYSICIAN: Chesley Mires, MD  INDICATION FOR STUDY:  [name] is a 59 y.o. male who presents to the sleep lab for evaluation of hypersomnia with obstructive sleep apnea.  He reports snoring, sleep disruption, apnea, and daytime sleepiness.  He has history of CAD, TIA, DM, HTN, COPD, ILD and chronic hypoxic respiratory failure on 4 liters oxygen.  EPWORTH SLEEPINESS SCORE: 15. HEIGHT: 6' (182.9 cm)  WEIGHT: 233 lb (105.688 kg)    Body mass index is 31.59 kg/(m^2).  NECK SIZE: 17 in.  MEDICATIONS:  Current Outpatient Prescriptions on File Prior to Visit  Medication Sig Dispense Refill  . albuterol (PROVENTIL HFA;VENTOLIN HFA) 108 (90 BASE) MCG/ACT inhaler Inhale 2 puffs into the lungs every 6 (six) hours as needed for wheezing or shortness of breath.    [name] albuterol (PROVENTIL) (2.5 MG/3ML) 0.083% nebulizer solution Take 3 mLs (2.5 mg total) by nebulization every 3 (three) hours as needed for wheezing or shortness of breath. 75 mL 12  . alfuzosin (UROXATRAL) 10 MG 24 hr tablet Take 10 mg by mouth daily with breakfast.    . aspirin 81 MG tablet Take 81 mg by mouth daily.    . budesonide-formoterol (SYMBICORT) 160-4.5 MCG/ACT inhaler Inhale 2 puffs into the lungs 2 (two) times daily. 1 Inhaler 3  . citalopram (CELEXA) 20 MG tablet Take 20 mg by mouth daily.    [name] diltiazem (CARDIZEM CD) 240 MG 24 hr capsule Take 240 mg by mouth every morning.    . Fish Oil-Cholecalciferol (FISH OIL + D3 PO) Take 1 capsule by mouth 4 (four) times daily.     [name] FLUoxetine (PROZAC) 10 MG capsule Take 10 mg by mouth every morning.    . furosemide (LASIX) 40 MG tablet Take 1 tablet (40 mg total) by mouth daily. 30 tablet 0  . gabapentin  (NEURONTIN) 300 MG capsule Take 300 mg by mouth at bedtime.    [name] glipiZIDE (GLUCOTROL) 10 MG tablet Take 10 mg by mouth 2 (two) times daily.    [name] ipratropium-albuterol (DUONEB) 0.5-2.5 (3) MG/3ML SOLN Take 3 mLs by nebulization every 6 (six) hours as needed (Shortness of breath).    . isosorbide mononitrate (IMDUR) 60 MG 24 hr tablet Take 60 mg by mouth every morning.     . nitroGLYCERIN (NITROSTAT) 0.4 MG SL tablet Place 1 tablet (0.4 mg total) under the tongue every 5 (five) minutes as needed for chest pain. 30 tablet 1  . ranitidine (ZANTAC) 300 MG tablet Take 300 mg by mouth every morning.     . rosuvastatin (CRESTOR) 20 MG tablet Take 20 mg by mouth daily.    . solifenacin (VESICARE) 5 MG tablet Take 5 mg by mouth daily.    . tamsulosin (FLOMAX) 0.4 MG CAPS capsule Take 1 capsule (0.4 mg total) by mouth daily after supper. 30 capsule 1   No current facility-administered medications on file prior to visit.    SLEEP ARCHITECTURE:  Total recording time: 447.5 minutes.  Total sleep time was: 395. minutes.  Sleep efficiency: 88.4%.  Sleep latency: 29.5 minutes.  REM latency: 143.5 minutes.  Stage N1: 6.6%.  Stage N2: 69.8%.  Stage N3: 0%.  Stage R:  23.6%.  Supine sleep: 275.5 minutes.  Non-supine sleep: 120 minutes.  CARDIAC DATA:  Average heart rate: 67 beats per minute. Rhythm strip: sinus rhythm.  RESPIRATORY DATA: Average respiratory rate: 20. Snoring: moderate. Average AHI: 16.1.   Apnea index: 8.  Hypopnea index: 8. Obstructive apnea index: 7.9.  Central apnea index: 0.  Mixed apnea index: 0.2. REM AHI: 35.9.  NREM AHI: 9.9. Supine AHI: 16. Non-supine AHI: 10.  MOVEMENT/PARASOMNIA:  Periodic limb movement: 0.  Period limb movements with arousals: 0. Restroom trips: 0.  OXYGEN DATA:  Baseline oxygenation: 93%. Lowest SaO2: 87%. Time spent below SaO2 90%: 2.1 minutes. Supplemental oxygen used: 4 liters.  IMPRESSION/ RECOMMENDATION:   This study shows moderate  obstructive sleep apnea with an AHI of 16.1 and SaO2 low of 87%.  Please note the study was conducted with the patient using his baseline home oxygen setting of 4 liters.   I would recommend the patient return to the sleep lab for a titration study.  He should start on CPAP and then can have supplemental oxygen adjusted as needed.  He might also need to be transitioned to BiPAP.   Chesley Mires, M.D. Diplomate, Tax adviser of Sleep Medicine  ELECTRONICALLY SIGNED ON:  07/30/2014, 5:21 PM [place] PH: [phone]   FX: [phone] [place]

## 2014-07-30 NOTE — Telephone Encounter (Signed)
PSG 07/16/14 >> AHI 16.1, SaO2 low 87%.  Done with pt using 4 liters oxygen.  Will have my nurse inform pt that sleep study shows moderate sleep apnea.  Please arrange for ROV to discuss tx options in more detail.  Okay to schedule with Tammy Parrett if I do not have any openings soon.

## 2014-07-31 NOTE — Telephone Encounter (Signed)
lmtcb 07/31/14

## 2014-08-01 NOTE — Telephone Encounter (Signed)
Called and spoke with pt and he is aware of results per VS.  Pt voiced his understanding and appt on Friday with VS to discuss tx.

## 2014-08-03 ENCOUNTER — Ambulatory Visit (INDEPENDENT_AMBULATORY_CARE_PROVIDER_SITE_OTHER): Payer: Medicaid Other | Admitting: Pulmonary Disease

## 2014-08-03 ENCOUNTER — Encounter: Payer: Self-pay | Admitting: Pulmonary Disease

## 2014-08-03 VITALS — BP 100/62 | HR 93 | Temp 97.7°F | Ht 73.0 in | Wt 236.8 lb

## 2014-08-03 DIAGNOSIS — J9611 Chronic respiratory failure with hypoxia: Secondary | ICD-10-CM

## 2014-08-03 DIAGNOSIS — J849 Interstitial pulmonary disease, unspecified: Secondary | ICD-10-CM

## 2014-08-03 DIAGNOSIS — J449 Chronic obstructive pulmonary disease, unspecified: Secondary | ICD-10-CM

## 2014-08-03 DIAGNOSIS — G4733 Obstructive sleep apnea (adult) (pediatric): Secondary | ICD-10-CM

## 2014-08-03 NOTE — Progress Notes (Signed)
Chief Complaint  Patient presents with  . Follow-up    Discuss sleep study    History of Present Illness: [name] is a 59 y.o. male smoker with OSA.  He has hx of emphysema, and ILD with chronic respiratory failure.  He is here to review his sleep study.  This showed moderate sleep apnea.  He has sleep study done while using 4 liters oxygen.  TESTS: Echo 02/25/14 >> EF 65 to 70%, mild LVH, grade 1 diastolic dysfx PFT 10/11/38 >> FEV1 3.67 (100%), FEV1% 6.11 (87%), DLCO 55%, no BD PSG 07/16/14 >> AHI 16.1, SaO2 low 87%.  Done with pt using 4 liters oxygen.  Past medical hx >> CAD, HTN, HLD, TIA, DM type II, Renal cell carcinoma s/p Rt nephrectomy, Depression, Anxiety, Cocaine abuse  Past surgical hx, Medications, Allergies, Family hx, Social hx all reviewed.   Physical Exam: Blood pressure 100/62, pulse 93, temperature 97.7 F (36.5 C), temperature source Oral, height 6\' 1"  (1.854 m), weight 236 lb 12.8 oz (107.412 kg), SpO2 95 %. Body mass index is 31.25 kg/(m^2).  General - No distress ENT - No sinus tenderness, no oral exudate, no LAN, wears dentures, MP 3 Cardiac - s1s2 regular, no murmur Chest - No wheeze/rales/dullness Back - No focal tenderness Abd - Soft, non-tender Ext - No edema Neuro - Normal strength Skin - No rashes Psych - normal mood, and behavior   Assessment/Plan:  Obstructive sleep apnea. I have reviewed the recent sleep study results with the patient.  We discussed how sleep apnea can affect various health problems including risks for hypertension, cardiovascular disease, and diabetes.  We also discussed how sleep disruption can increase risks for accident, such as while driving.  Weight loss as a means of improving sleep apnea was also reviewed.  Additional treatment options discussed were CPAP therapy, oral appliance, and surgical intervention. Plan: - will arrange for in lab titration study >> will determine if he needs CPAP vs BiPAP, and also what  required nocturnal oxygen needs are  Chronic respiratory failure 2nd to emphysema and ILD. Plan: - he is to continue 4 liters oxygen 24/7 for now - he is followed by Dr. Chase Caller  Tobacco abuse. Plan: - discussed importance of smoking cessation   Kevin Mires, MD [place] [facility] Pager:  [phone]

## 2014-08-03 NOTE — Patient Instructions (Signed)
Will arrange for CPAP titration study in sleep lab Will call to schedule follow up after sleep study reviewed

## 2014-08-09 ENCOUNTER — Ambulatory Visit: Payer: Medicaid Other | Admitting: Internal Medicine

## 2014-08-16 ENCOUNTER — Ambulatory Visit (INDEPENDENT_AMBULATORY_CARE_PROVIDER_SITE_OTHER): Payer: Medicaid Other | Admitting: Internal Medicine

## 2014-08-16 ENCOUNTER — Encounter: Payer: Self-pay | Admitting: Internal Medicine

## 2014-08-16 VITALS — BP 92/58 | HR 82 | Ht 72.0 in | Wt 238.0 lb

## 2014-08-16 DIAGNOSIS — I9589 Other hypotension: Secondary | ICD-10-CM

## 2014-08-16 DIAGNOSIS — J849 Interstitial pulmonary disease, unspecified: Secondary | ICD-10-CM | POA: Insufficient documentation

## 2014-08-16 DIAGNOSIS — J449 Chronic obstructive pulmonary disease, unspecified: Secondary | ICD-10-CM | POA: Insufficient documentation

## 2014-08-16 DIAGNOSIS — J439 Emphysema, unspecified: Secondary | ICD-10-CM | POA: Insufficient documentation

## 2014-08-16 DIAGNOSIS — Z720 Tobacco use: Secondary | ICD-10-CM

## 2014-08-16 DIAGNOSIS — R42 Dizziness and giddiness: Secondary | ICD-10-CM

## 2014-08-16 NOTE — Patient Instructions (Addendum)
ICD-9-CM ICD-10-CM   1. ILD (interstitial lung disease) 515 J84.9 Pulmonary function test     CT Chest High Resolution     Ambulatory referral to Neurology  2. Tobacco abuse 305.1 Z72.0 Pulmonary function test     CT Chest High Resolution     Ambulatory referral to Neurology  3. Dizziness and giddiness 780.4 R42 Pulmonary function test     CT Chest High Resolution     Ambulatory referral to Neurology  4. Other specified hypotension 458.8 I95.89 Pulmonary function test     CT Chest High Resolution     Ambulatory referral to Neurology   #ILDand chronic respiratory failure  - You have interstitial lung disease due to smoking - most important treatment is to quit smoking - We need to reassess progression versus stability  - Do full pulmonary function test and CT scan of the chest as well as possible  # dizziness with transient left vision loss and low blood pressure   - Not sure what the cause for this is what might be related to blood pressure drugs  - cut down her lisinopril from 10 mg once daily to 5 mg once daily  - Urgently please visit with her primary care physician [name], [name], MD  - Refer to neurology   #smoking  - You absolutely need to quit smoking as soon as possible   # follow-up  - Finish pulmonary function test and CT scan of the chest and return to visit my nurse partition Tammy Parrott in the next few weeks  - At follow-up repeat walking desaturation test on room air

## 2014-08-16 NOTE — Progress Notes (Signed)
Subjective:    Patient ID: [name], male    DOB: 1956/03/15, 59 y.o.   MRN: [id number]    HPI  OV 08/16/2014   Chief Complaint  Patient presents with  . Follow-up    Pt c/o increasd DOE for the past month. He states that he gets out of breath walking to his mailbox and back and last wk had episode of CP when he walked to his mailbox.  His cough is unchanged. He is using albuterol inhaler 3-4 x daily on average and neb maybe once per wk.     Follow-up interstitial lung disease and chronic respiratory failure in the setting of sleep apnea and continued smoking. 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 He did follow-up with my nurse practitioner and was seemed to be improving. This is his first visit after the biopsy to see me. He continues to use his oxygen at 4-6 L out of subjective needs. He has not had any kind of formal assessment whether he needs his oxygen anymore. He reports ongoing shortness of breath class III levels of activity with exertion and relieved by rest. In addition for the last month is reporting dizziness particular orthostatic that is associated with blood pressure systolic in the 60V [baseline he says is 371 systolic]based on self measurement. The dizziness episodes associated with transient vision loss in the left eye. He has not seen his primary care physician for these symptoms in association with these dizziness symptoms his dyspnea seems to be worse   Review of his medications show that he is on multiple antihypertensive medications   Wlak test 185 feet x on ROOM AIR (I stopped his 4L o2 for 7 minutes) - did NOT DESATURATE but got dyspneic     reports that he has been smoking Cigarettes.  He started smoking about 42 years ago. He has a 20.5  pack-year smoking history. He has never used smokeless tobacco.     Immunization History  Administered Date(s) Administered  . Influenza Split 04/04/2014  . Pneumococcal Polysaccharide-23 11/04/2012      Current outpatient prescriptions:  .  albuterol (PROVENTIL HFA;VENTOLIN HFA) 108 (90 BASE) MCG/ACT inhaler, Inhale 2 puffs into the lungs every 6 (six) hours as needed for wheezing or shortness of breath., Disp: , Rfl:  .  albuterol (PROVENTIL) (2.5 MG/3ML) 0.083% nebulizer solution, Take 3 mLs (2.5 mg total) by nebulization every 3 (three) hours as needed for wheezing or shortness of breath., Disp: 75 mL, Rfl: 12 .  alfuzosin (UROXATRAL) 10 MG 24 hr tablet, Take 10 mg by mouth daily with breakfast., Disp: , Rfl:  .  aspirin 81 MG tablet, Take 81 mg by mouth daily., Disp: , Rfl:  .  budesonide-formoterol (SYMBICORT) 160-4.5 MCG/ACT inhaler, Inhale 2 puffs into the lungs 2 (two) times daily., Disp: 1 Inhaler, Rfl: 3 .  citalopram (CELEXA) 20 MG tablet, Take 20 mg by mouth daily., Disp: , Rfl:  .  diltiazem (CARDIZEM CD) 240 MG 24 hr capsule, Take 240 mg by mouth every morning., Disp: , Rfl:  .  Fish Oil-Cholecalciferol (FISH OIL + D3 PO), Take 1 capsule by mouth 4 (four) times daily. , Disp: , Rfl:  .  FLUoxetine (PROZAC) 10 MG capsule, Take 10 mg by mouth every morning., Disp: , Rfl:  .  furosemide (LASIX) 40 MG tablet, Take 1 tablet (40 mg total) by mouth daily., Disp: 30 tablet, Rfl: 0 .  gabapentin (NEURONTIN) 300 MG capsule, Take 300 mg by mouth at bedtime., Disp: , Rfl:  .  glipiZIDE (GLUCOTROL) 10 MG tablet, Take 10 mg by mouth 2 (two) times daily., Disp: , Rfl:  .  ipratropium-albuterol (DUONEB) 0.5-2.5 (3) MG/3ML SOLN, Take 3 mLs by nebulization every 6 (six) hours as needed (Shortness of breath)., Disp: , Rfl:  .  isosorbide mononitrate (IMDUR) 60 MG 24 hr tablet, Take 60 mg by mouth every morning. , Disp: , Rfl:  .  lisinopril (PRINIVIL,ZESTRIL) 10 MG tablet, Take 10 mg by mouth  daily., Disp: , Rfl:  .  nitroGLYCERIN (NITROSTAT) 0.4 MG SL tablet, Place 1 tablet (0.4 mg total) under the tongue every 5 (five) minutes as needed for chest pain., Disp: 30 tablet, Rfl: 1 .  ranitidine (ZANTAC) 300 MG tablet, Take 300 mg by mouth every morning. , Disp: , Rfl:  .  rosuvastatin (CRESTOR) 20 MG tablet, Take 20 mg by mouth daily., Disp: , Rfl:  .  solifenacin (VESICARE) 5 MG tablet, Take 5 mg by mouth daily., Disp: , Rfl:  .  tamsulosin (FLOMAX) 0.4 MG CAPS capsule, Take 1 capsule (0.4 mg total) by mouth daily after supper., Disp: 30 capsule, Rfl: 1   Review of Systems  Constitutional: Positive for activity change and fatigue. Negative for fever, diaphoresis and unexpected weight change.  HENT: Negative.  Negative for congestion.   Eyes: Negative.   Respiratory: Positive for apnea, cough and shortness of breath. Negative for wheezing and stridor.   Cardiovascular: Negative.   Gastrointestinal: Negative.   Endocrine: Negative.   Genitourinary: Negative.   Musculoskeletal: Positive for back pain and arthralgias. Negative for joint swelling and gait problem.  Skin: Negative.   Allergic/Immunologic: Negative.   Neurological: Positive for dizziness, weakness and light-headedness. Negative for syncope, facial asymmetry, speech difficulty, numbness and headaches.  Hematological: Negative.   Psychiatric/Behavioral: Negative.        Objective:   Physical Exam  Constitutional: He is oriented to person, place, and time. He appears well-developed and well-nourished. No distress.  Body mass index is 32.27 kg/(m^2). Deconditioned looking  HENT:  Head: Normocephalic and atraumatic.  Right Ear: External ear normal.  Left Ear: External ear normal.  Mouth/Throat: Oropharynx is clear and moist. No oropharyngeal exudate.  Eyes: Conjunctivae and EOM are normal. Pupils are equal, round, and reactive to light. Right eye exhibits no discharge. Left eye exhibits no discharge. No scleral  icterus.  Neck: Normal range of motion. Neck supple. No JVD present. No tracheal deviation present. No thyromegaly present.  Cardiovascular: Normal rate, regular rhythm and intact distal pulses.  Exam reveals no gallop and no friction rub.   No murmur heard. Pulmonary/Chest: Effort normal. No respiratory distress. He has no wheezes. He has rales. He exhibits no tenderness.  Abdominal: Soft. Bowel sounds are normal. He exhibits no distension and no mass. There is no tenderness. There is no rebound and no guarding.  Musculoskeletal: Normal range of motion. He exhibits no edema or tenderness.  Lymphadenopathy:    He has no cervical adenopathy.  Neurological: He is alert and oriented to person, place, and time. He has normal reflexes. No cranial nerve deficit. Coordination normal.  Skin: Skin is warm and dry. No rash noted. He is not diaphoretic. No erythema. No pallor.  Psychiatric: He has a normal mood and affect. His behavior is normal. Judgment  and thought content normal.  Nursing note and vitals reviewed.   Filed Vitals:   08/16/14 1625 08/16/14 1626 08/16/14 1650 08/16/14 1652  BP: 90/50 80/50 108/62 92/58  Pulse: 72  69 82  Height: 6' (1.829 m)     Weight: 238 lb (107.956 kg)     SpO2: 97%      was not orthostatisc       Assessment & Plan:     ICD-9-CM ICD-10-CM   1. ILD (interstitial lung disease) 515 J84.9 Pulmonary function test     CT Chest High Resolution     CANCELED: Ambulatory referral to Neurology  2. Tobacco abuse 305.1 Z72.0 Pulmonary function test     CT Chest High Resolution     CANCELED: Ambulatory referral to Neurology  3. Dizziness and giddiness 780.4 R42 Pulmonary function test     CT Chest High Resolution     Ambulatory referral to Neurology     CANCELED: Ambulatory referral to Neurology  4. Other specified hypotension 458.8 I95.89 Pulmonary function test     CT Chest High Resolution     CANCELED: Ambulatory referral to Neurology   #ILDand chronic  respiratory failure  - You have interstitial lung disease due to smoking - most important treatment is to quit smoking - We need to reassess progression versus stability - I am not convinced that current active symptoms are due to disease progression -  I am not convinced you need o2 anymore  PLAN  - Do full pulmonary function test and CT scan of the chest as well as possible  - at followup repeat walk test in office on RA for 20 minutes   # dizziness with transient left vision loss and low blood pressure   - Not sure what the cause for this is what might be related to blood pressure drugs  - cut down her lisinopril from 10 mg once daily to 5 mg once daily  - Urgently please visit with her primary care physician [name], [name], MD  - Refer to neurology   #smoking  - You absolutely need to quit smoking as soon as possible   # follow-up  - Finish pulmonary function test and CT scan of the chest and return to visit my nurse partition Tammy Parrott in the next few weeks  - At follow-up repeat walking desaturation test on room air    Dr. Brand Males, M.D., [facility].C.P Pulmonary and Critical Care Medicine Staff Physician [facility] Pager: [phone], If no answer or between  15:00h - 7:00h: call [phone]  08/16/2014 5:21 PM

## 2014-08-29 ENCOUNTER — Ambulatory Visit (INDEPENDENT_AMBULATORY_CARE_PROVIDER_SITE_OTHER)
Admission: RE | Admit: 2014-08-29 | Discharge: 2014-08-29 | Disposition: A | Discharge status: Home / Self Care | Payer: Self-pay | Source: Ambulatory Visit | Attending: Internal Medicine | Admitting: Internal Medicine

## 2014-08-29 DIAGNOSIS — J849 Interstitial pulmonary disease, unspecified: Secondary | ICD-10-CM

## 2014-08-29 DIAGNOSIS — R42 Dizziness and giddiness: Secondary | ICD-10-CM

## 2014-08-29 DIAGNOSIS — I9589 Other hypotension: Secondary | ICD-10-CM

## 2014-08-29 DIAGNOSIS — Z720 Tobacco use: Secondary | ICD-10-CM

## 2014-09-05 ENCOUNTER — Encounter (HOSPITAL_BASED_OUTPATIENT_CLINIC_OR_DEPARTMENT_OTHER): Payer: Medicaid Other

## 2014-09-07 ENCOUNTER — Ambulatory Visit: Payer: Medicaid Other | Admitting: Adult Health

## 2014-09-07 ENCOUNTER — Other Ambulatory Visit (HOSPITAL_COMMUNITY): Payer: Self-pay | Admitting: Urology

## 2014-09-07 ENCOUNTER — Ambulatory Visit (INDEPENDENT_AMBULATORY_CARE_PROVIDER_SITE_OTHER): Payer: Medicaid Other | Admitting: Internal Medicine

## 2014-09-07 ENCOUNTER — Ambulatory Visit (HOSPITAL_COMMUNITY)
Admission: RE | Admit: 2014-09-07 | Discharge: 2014-09-07 | Disposition: A | Discharge status: Home / Self Care | Payer: Medicaid Other | Source: Ambulatory Visit | Attending: Urology | Admitting: Urology

## 2014-09-07 DIAGNOSIS — I9589 Other hypotension: Secondary | ICD-10-CM

## 2014-09-07 DIAGNOSIS — J849 Interstitial pulmonary disease, unspecified: Secondary | ICD-10-CM

## 2014-09-07 DIAGNOSIS — R05 Cough: Secondary | ICD-10-CM | POA: Diagnosis present

## 2014-09-07 DIAGNOSIS — R0602 Shortness of breath: Secondary | ICD-10-CM | POA: Insufficient documentation

## 2014-09-07 DIAGNOSIS — J449 Chronic obstructive pulmonary disease, unspecified: Secondary | ICD-10-CM | POA: Insufficient documentation

## 2014-09-07 DIAGNOSIS — C641 Malignant neoplasm of right kidney, except renal pelvis: Secondary | ICD-10-CM

## 2014-09-07 DIAGNOSIS — Z720 Tobacco use: Secondary | ICD-10-CM | POA: Insufficient documentation

## 2014-09-07 DIAGNOSIS — E119 Type 2 diabetes mellitus without complications: Secondary | ICD-10-CM | POA: Insufficient documentation

## 2014-09-07 DIAGNOSIS — R42 Dizziness and giddiness: Secondary | ICD-10-CM

## 2014-09-07 LAB — PULMONARY FUNCTION TEST
DL/VA % pred: 57 %
DL/VA: 2.68 ml/min/mmHg/L
DLCO UNC: 16.41 ml/min/mmHg
DLCO unc % pred: 50 %
FEF 25-75 Post: 5.23 L/sec
FEF 25-75 Pre: 3.71 L/sec
FEF2575-%Change-Post: 41 %
FEF2575-%PRED-PRE: 123 %
FEF2575-%Pred-Post: 173 %
FEV1-%Change-Post: 9 %
FEV1-%PRED-PRE: 90 %
FEV1-%Pred-Post: 98 %
FEV1-Post: 3.58 L
FEV1-Pre: 3.28 L
FEV1FVC-%CHANGE-POST: 2 %
FEV1FVC-%Pred-Pre: 107 %
FEV6-%CHANGE-POST: 6 %
FEV6-%PRED-PRE: 87 %
FEV6-%Pred-Post: 93 %
FEV6-PRE: 4.01 L
FEV6-Post: 4.28 L
FEV6FVC-%Change-Post: 0 %
FEV6FVC-%PRED-POST: 104 %
FEV6FVC-%Pred-Pre: 105 %
FVC-%CHANGE-POST: 6 %
FVC-%PRED-PRE: 83 %
FVC-%Pred-Post: 89 %
FVC-PRE: 4.03 L
FVC-Post: 4.29 L
Post FEV1/FVC ratio: 83 %
Post FEV6/FVC ratio: 100 %
Pre FEV1/FVC ratio: 81 %
Pre FEV6/FVC Ratio: 100 %
RV % PRED: 85 %
RV: 1.92 L
TLC % pred: 90 %
TLC: 6.36 L

## 2014-09-07 NOTE — Progress Notes (Signed)
PFT done today. 

## 2014-09-14 ENCOUNTER — Ambulatory Visit (INDEPENDENT_AMBULATORY_CARE_PROVIDER_SITE_OTHER): Payer: Medicaid Other | Admitting: Adult Health

## 2014-09-14 ENCOUNTER — Encounter: Payer: Self-pay | Admitting: Adult Health

## 2014-09-14 VITALS — BP 94/54 | HR 82 | Temp 98.2°F | Ht 72.0 in | Wt 240.4 lb

## 2014-09-14 DIAGNOSIS — J849 Interstitial pulmonary disease, unspecified: Secondary | ICD-10-CM

## 2014-09-14 DIAGNOSIS — J449 Chronic obstructive pulmonary disease, unspecified: Secondary | ICD-10-CM

## 2014-09-14 DIAGNOSIS — J9611 Chronic respiratory failure with hypoxia: Secondary | ICD-10-CM

## 2014-09-14 DIAGNOSIS — I10 Essential (primary) hypertension: Secondary | ICD-10-CM

## 2014-09-14 MED ORDER — VALSARTAN 80 MG PO TABS: 80.0000 mg | ORAL_TABLET | Freq: Every day | ORAL | Status: DC

## 2014-09-14 MED ORDER — DOXYCYCLINE HYCLATE 100 MG PO TABS: 100.0000 mg | ORAL_TABLET | Freq: Two times a day (BID) | ORAL | Status: DC

## 2014-09-14 NOTE — Assessment & Plan Note (Signed)
CT chest is stable PFT is stable Continue on current regimen

## 2014-09-14 NOTE — Assessment & Plan Note (Addendum)
Mild flare with upper airway cough , possible bronchitis Patient is to stop ACE inhibitor Would avoid ACE inhibitor's in the future if possible Encouraged to quit  Smoking PLAN:;a Hold Fish Oil  Stop Lisinopril as it may cause your cough to be worse.  Begin Diovan 80mg  daily  Follow up with Domenic Polite for blood pressure management /cardiac issues in next couple of weeks.  Doxycycline 100mg  Twice daily  For 7 days .  Mucinex DM Twice daily As needed  Cough/congestion.  follow up Dr. Chase Caller in 2-3 months and As needed   Please contact office for sooner follow up if symptoms do not improve or worsen or seek emergency care

## 2014-09-14 NOTE — Progress Notes (Signed)
Subjective:    Patient ID: [name], male    DOB: 04-08-1956, 59 y.o.   MRN: [id number]    HPI  OV 08/16/2014   Chief Complaint  Patient presents with  . Follow-up    Pt c/o increasd DOE for the past month. He states that he gets out of breath walking to his mailbox and back and last wk had episode of CP when he walked to his mailbox.  His cough is unchanged. He is using albuterol inhaler 3-4 x daily on average and neb maybe once per wk.     Follow-up interstitial lung disease and chronic respiratory failure in the setting of sleep apnea and continued smoking. 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 He did follow-up with my nurse practitioner and was seemed to be improving. This is his first visit after the biopsy to see me. He continues to use his oxygen at 4-6 L out of subjective needs. He has not had any kind of formal assessment whether he needs his oxygen anymore. He reports ongoing shortness of breath class III levels of activity with exertion and relieved by rest. In addition for the last month is reporting dizziness particular orthostatic that is associated with blood pressure systolic in the 78G [baseline he says is 956 systolic]based on self measurement. The dizziness episodes associated with transient vision loss in the left eye. He has not seen his primary care physician for these symptoms in association with these dizziness symptoms his dyspnea seems to be worse   Review of his medications show that he is on multiple antihypertensive medications   Wlak test 185 feet x on ROOM AIR (I stopped his 4L o2 for 7 minutes) - did NOT DESATURATE but got dyspneic     reports that he has been smoking Cigarettes.  He started smoking about 42 years ago. He has a 20.5  pack-year smoking history. He has never used smokeless tobacco.    09/14/2014 Follow up : ILD  Patient returns for a follow-up Patient underwent a PFT on February 19 showed an FEV1 at 90%, ratio 81, FVC 83%, no significant bronchodilator response, diffusing capacity decreased at 50%. No significant change in PFT except for diffusing capacity. Previously was at 55%. CT chest shows a very subtle upper lobe . peribronchovascular groundglass consistent with biopsy-proven DIP, mild emphysema. Has cut back on smoking. Discussed smoking cessation.  Does complains of persistent cough for last month.  Coughs so hard he get sick and weak. Feels lightheaded if he coughs very hard. Cough is now productive . No hemoptysis, chest pain, orthopnea, PND, or increased leg swelling Patient is on ACE inhibitor. Over-the-counter cough medicines or not working      Review of Systems  Constitutional:   No  weight loss, night sweats,  Fevers, chills, + fatigue, or  lassitude.  HEENT:   No headaches,  Difficulty swallowing,  Tooth/dental problems, or  Sore throat,                No sneezing, itching, ear ache, nasal congestion, post nasal drip,   CV:  No chest pain,  Orthopnea, PND, swelling in lower extremities, anasarca, dizziness, palpitations, syncope.   GI  No heartburn, indigestion, abdominal pain, nausea, vomiting, diarrhea, change in bowel habits, loss of appetite, bloody stools.  Resp:    No chest wall deformity  Skin: no rash or lesions.  GU: no dysuria, change in color of urine, no urgency or frequency.  No flank pain, no hematuria   MS:  No joint pain or swelling.  No decreased range of motion.  No back pain.  Psych:  No change in mood or affect. No depression or anxiety.  No memory loss.        Objective:   Physical Exam  Constitutional: He is oriented to person, place, and time. He appears well-developed and well-nourished. No distress. HENT:  Head: Normocephalic and atraumatic.   Right Ear: External ear normal.  Left Ear: External ear normal.  Mouth/Throat: Oropharynx is clear and moist. No oropharyngeal exudate.  Eyes: Conjunctivae and EOM are normal. Pupils are equal, round, and reactive to light. Right eye exhibits no discharge. Left eye exhibits no discharge. No scleral icterus.  Neck: Normal range of motion. Neck supple. No JVD present. No tracheal deviation present. No thyromegaly present.  Cardiovascular: Normal rate, regular rhythm and intact distal pulses.  Exam reveals no gallop and no friction rub.   No murmur heard. Pulmonary/Chest: Effort normal. No respiratory distress. He has no wheezes. He has rales. He exhibits no tenderness.  Abdominal: Soft. Bowel sounds are normal. He exhibits no distension and no mass. There is no tenderness. There is no rebound and no guarding.  Musculoskeletal: Normal range of motion. He exhibits no edema or tenderness.  Lymphadenopathy:    He has no cervical adenopathy.  Neurological: He is alert and oriented to person, place, and time. He has normal reflexes. No cranial nerve deficit. Coordination normal.  Skin: Skin is warm and dry. No rash noted. He is not diaphoretic. No erythema. No pallor.  Psychiatric: He has a normal mood and affect. His behavior is normal. Judgment and thought content normal.  Nursing note and vitals reviewed.        Assessment & Plan:

## 2014-09-14 NOTE — Assessment & Plan Note (Signed)
Will stop ACE inhibitor due to severe cough Patient is to stop lisinopril Began  Diovan 80 mg daily Have asked him yo follow-up with his cardiologist regarding these changes and future prescriptions

## 2014-09-14 NOTE — Patient Instructions (Addendum)
Hold Fish Oil  Stop Lisinopril as it may cause your cough to be worse.  Begin Diovan 80mg  daily  Follow up with Domenic Polite for blood pressure management /cardiac issues in next couple of weeks.  Doxycycline 100mg  Twice daily  For 7 days .  Mucinex DM Twice daily As needed  Cough/congestion.  follow up Dr. Chase Caller in 2-3 months and As needed   Please contact office for sooner follow up if symptoms do not improve or worsen or seek emergency care

## 2014-09-14 NOTE — Addendum Note (Signed)
Addended by: Parke Poisson E on: 09/14/2014 03:00 PM   Modules accepted: Orders, Medications

## 2014-09-14 NOTE — Assessment & Plan Note (Signed)
Compensated on oxygen 

## 2014-09-21 ENCOUNTER — Encounter (HOSPITAL_BASED_OUTPATIENT_CLINIC_OR_DEPARTMENT_OTHER): Payer: Medicaid Other

## 2014-09-26 ENCOUNTER — Ambulatory Visit (INDEPENDENT_AMBULATORY_CARE_PROVIDER_SITE_OTHER): Payer: Medicaid Other | Admitting: Neurology

## 2014-09-26 ENCOUNTER — Encounter: Payer: Self-pay | Admitting: Neurology

## 2014-09-26 ENCOUNTER — Ambulatory Visit (HOSPITAL_BASED_OUTPATIENT_CLINIC_OR_DEPARTMENT_OTHER): Payer: Medicaid Other

## 2014-09-26 VITALS — BP 88/54 | HR 81 | Ht 71.5 in | Wt 241.0 lb

## 2014-09-26 DIAGNOSIS — E785 Hyperlipidemia, unspecified: Secondary | ICD-10-CM

## 2014-09-26 DIAGNOSIS — R42 Dizziness and giddiness: Secondary | ICD-10-CM | POA: Insufficient documentation

## 2014-09-26 DIAGNOSIS — R292 Abnormal reflex: Secondary | ICD-10-CM | POA: Insufficient documentation

## 2014-09-26 DIAGNOSIS — R200 Anesthesia of skin: Secondary | ICD-10-CM | POA: Insufficient documentation

## 2014-09-26 DIAGNOSIS — I10 Essential (primary) hypertension: Secondary | ICD-10-CM | POA: Insufficient documentation

## 2014-09-26 LAB — CREATININE, SERUM: Creat: 2.67 mg/dL — ABNORMAL HIGH (ref 0.50–1.35)

## 2014-09-26 LAB — BUN: BUN: 39 mg/dL — ABNORMAL HIGH (ref 6–23)

## 2014-09-26 MED ORDER — CLOPIDOGREL BISULFATE 75 MG PO TABS: 75.0000 mg | ORAL_TABLET | Freq: Every day | ORAL | Status: DC

## 2014-09-26 NOTE — Patient Instructions (Addendum)
1. MRI brain without contrast 2. MRA head without contrast 3. MRA neck with and without contrast 4. MRI cervical spine without contrast 5. Start Plavix 75mg  once a day, stop aspirin 6. Call your PCP today about the low BP, your BP medications may need adjustment 7. If symptoms progress or worsen, go to ER immediately 8. Follow-up in 1 month  YOU HAVE BEEN SCHEDULED AT [facility] FOR MRI BRAIN, MRA HEAD, MRA NECK, MRI C-SPINE          October 01, 2014 @ 8:45 AM  [address] [place], [place] [address] [phone]

## 2014-09-26 NOTE — Progress Notes (Signed)
NEUROLOGY CONSULTATION NOTE  [name] MRN: [id number] DOB: 05-25-1956  Referring provider: Dr. Brand Males Primary care provider: Dr. Betty [name]  Reason for consult:  Dizziness, transient loss of vision on left eye  Dear Dr Chase Caller:  Thank you for your kind referral of [name] for consultation of the above symptoms. Although his history is well known to you, please allow me to reiterate it for the purpose of our medical record. Records and images were personally reviewed where available.  HISTORY OF PRESENT ILLNESS: This is a very pleasant 59 year old right-handed man with a history of hypertension, hyperlipidemia, diabetes, interstitial lung disease, chronic respiratory failure, sleep apnea, tobacco use, CAD s/p stent, renal CA s/p nephrectomy, presenting for evaluation of episodes of dizziness associated with transient left vision loss. He started having symptoms at the beginning of January 2016, he reported dizziness with left vision loss to Dr. Chase Caller at the end of January. At that time, it was noted that the dizziness was orthostatic, with SBP in the 90s (his usual baseline is 427 systolic). He describes the dizziness as a floating sensation where his legs would start wobbling and give out, his whole body becomes shaky and he may fall to the ground with no loss of consciousness. He has fallen 2-3 times in the past 2 months, last fall was a week ago. Associated with the dizziness is transient vision loss on the left eye where he cannot see objects in front on him, only seeing a white light. Symptoms occur only when he is coughing, bending over, or changing positions from sitting to standing. He would lie down and symptoms resolve in an hour. Symptoms do not occur when supine. He occasionally has headaches associated with these, with pain on the right of vertex, radiating down to his neck and shoulder blades. He denies any eye pain, no focal numbness/tingling/weakness.  He has horizontal diplopia 1-2 times a week. He lives alone but now has had to move in with his mother since the symptoms started. He has noticed that his memory has changed as well in the past month or so, he forgets what he was going to do. He does not drive. He denies any dysphagia but has noticed more effort is needed to chew. He denies any bowel/bladder dysfunction. He had some chest pain this morning and took Nitro.   On his clinic visit on 08/16/14, Lisinopril dose was cut in half with no change in symptoms. It was discontinued on 09/14/14 due to severe cough, and he was started on Diovan 80mg . He also takes Lasix and Diltiazem. He has been on aspirin for the past year, previously on aspirin + Plavix after his stent 2 years ago.  PAST MEDICAL HISTORY: Past Medical History  Diagnosis Date  . Essential hypertension, benign   . TIA (transient ischemic attack) 1997    No residual neurological deficits.  . Noncompliance   . COPD (chronic obstructive pulmonary disease)   . Hyperlipidemia   . History of pneumonia   . Type 2 diabetes mellitus 2011  . GERD (gastroesophageal reflux disease)   . Substance abuse   . Cocaine abuse     02-14-14 dicusses freely with medical team, prefers no discussions with family present.  . Renal cell carcinoma     Status post radical nephrectomy August 2015  . Cholecystitis, acute 12/20/2013    Lap chole 6/5  . Coronary atherosclerosis of native coronary artery     a. 03/09/2013 Cath/PCI: LM  nl, LAD: 50p, 5m (2.5x16 promus DES), LCX nl, OM1 min irregs, LPL/LPDA diff dzs, RCA nondom, mod diff dzs, EF 55%.  . Depression   . Anxiety   . History of nephrolithiasis   . Arthritis   . Hepatitis Late 1970s  . Complication of anesthesia     used Cpap with last surgery  . Shortness of breath   . Sleep apnea     on 4l    PAST SURGICAL HISTORY: Past Surgical History  Procedure Laterality Date  . Appendectomy  1970's  . Cholecystectomy N/A 12/22/2013    Procedure:  LAPAROSCOPIC CHOLECYSTECTOMY ;  Surgeon: [name], MD;  Location: [facility];  Service: General;  Laterality: N/A;  . Robot assisted laparoscopic nephrectomy Right 02/21/2014    Procedure: ROBOTIC ASSISTED LAPAROSCOPIC RIGHT NEPHRECTOMY ;  Surgeon: [name], MD;  Location: [facility];  Service: Urology;  Laterality: Right;  . Laparoscopic lysis of adhesions  02/21/2014    Procedure: LAPAROSCOPIC LYSIS OF ADHESIONS EXTINSIVE;  Surgeon: [name], MD;  Location: [facility];  Service: Urology;;  . Video assisted thoracoscopy Left 05/18/2014    Procedure: LEFT VIDEO ASSISTED THORACOSCOPY;  Surgeon: [name], MD;  Location: [facility];  Service: Thoracic;  Laterality: Left;  . Lung biopsy Left 05/18/2014    Procedure: LUNG BIOPSY left upper lobe & left lower lobe;  Surgeon: [name], MD;  Location: [facility];  Service: Thoracic;  Laterality: Left;  . Video bronchoscopy  05/18/2014    Procedure: VIDEO BRONCHOSCOPY;  Surgeon: [name], MD;  Location: [facility];  Service: Thoracic;;  . Left heart catheterization with coronary angiogram N/A 06/02/2012    Procedure: LEFT HEART CATHETERIZATION WITH CORONARY ANGIOGRAM;  Surgeon: [name], MD;  Location: [facility] C[facility] LAB;  Service: Cardiovascular;  Laterality: N/A;  . Left heart catheterization with coronary angiogram N/A 03/09/2013    Procedure: LEFT HEART CATHETERIZATION WITH CORONARY ANGIOGRAM;  Surgeon: [name], MD;  Location: [facility] CATH LAB;  Service: Cardiovascular;  Laterality: N/A;  . Left heart catheterization with coronary angiogram N/A 12/20/2013    Procedure: LEFT HEART CATHETERIZATION WITH CORONARY ANGIOGRAM;  Surgeon: [name], MD;  Location: [facility] C[facility] LAB;  Service: Cardiovascular;  Laterality: N/A;    MEDICATIONS: Current Outpatient Prescriptions on File Prior to Visit  Medication Sig Dispense Refill  . albuterol (PROVENTIL HFA;VENTOLIN HFA) 108 (90 BASE) MCG/ACT inhaler Inhale 2 puffs into the  lungs every 6 (six) hours as needed for wheezing or shortness of breath.    [name] albuterol (PROVENTIL) (2.5 MG/3ML) 0.083% nebulizer solution Take 3 mLs (2.5 mg total) by nebulization every 3 (three) hours as needed for wheezing or shortness of breath. 75 mL 12  . alfuzosin (UROXATRAL) 10 MG 24 hr tablet Take 10 mg by mouth daily with breakfast.    . budesonide-formoterol (SYMBICORT) 160-4.5 MCG/ACT inhaler Inhale 2 puffs into the lungs 2 (two) times daily. 1 Inhaler 3  . citalopram (CELEXA) 20 MG tablet Take 20 mg by mouth daily.    [name] diltiazem (CARDIZEM CD) 240 MG 24 hr capsule Take 240 mg by mouth every morning.    . Fish Oil-Cholecalciferol (FISH OIL + D3 PO) Take 1 capsule by mouth 4 (four) times daily.     [name] FLUoxetine (PROZAC) 10 MG capsule Take 10 mg by mouth every morning.    . furosemide (LASIX) 40 MG tablet Take 1 tablet (40 mg total) by mouth daily. 30 tablet 0  . gabapentin (NEURONTIN) 300  MG capsule Take 300 mg by mouth at bedtime.    [name] glipiZIDE (GLUCOTROL) 10 MG tablet Take 10 mg by mouth 2 (two) times daily.    [name] ipratropium-albuterol (DUONEB) 0.5-2.5 (3) MG/3ML SOLN Take 3 mLs by nebulization every 6 (six) hours as needed (Shortness of breath).    . isosorbide mononitrate (IMDUR) 60 MG 24 hr tablet Take 60 mg by mouth every morning.     . ranitidine (ZANTAC) 300 MG tablet Take 300 mg by mouth every morning.     . solifenacin (VESICARE) 5 MG tablet Take 5 mg by mouth daily.    . tamsulosin (FLOMAX) 0.4 MG CAPS capsule Take 1 capsule (0.4 mg total) by mouth daily after supper. 30 capsule 1  . valsartan (DIOVAN) 80 MG tablet Take 1 tablet (80 mg total) by mouth daily. 30 tablet 1  . nitroGLYCERIN (NITROSTAT) 0.4 MG SL tablet Place 1 tablet (0.4 mg total) under the tongue every 5 (five) minutes as needed for chest pain. (Patient not taking: Reported on 09/26/2014) 30 tablet 1  . rosuvastatin (CRESTOR) 20 MG tablet Take 20 mg by mouth daily.     No current facility-administered  medications on file prior to visit.    ALLERGIES: No Known Allergies  FAMILY HISTORY: Family History  Problem Relation Age of Onset  . Hypertension    . Diabetes    . Stroke    . Lung cancer Father   . Cancer Mother     Thyroid - living in her 23's.  . Cancer Maternal Grandmother     Breast  . Cancer Maternal Grandfather     Throat and stomach  . CAD Father   . CAD Brother     SOCIAL HISTORY: History   Social History  . Marital Status: Single    Spouse Name: N/A  . Number of Children: N/A  . Years of Education: N/A   Occupational History  . N/A    Social History Main Topics  . Smoking status: Current Every Day Smoker -- 0.50 packs/day for 41 years    Types: Cigarettes    Start date: 05/03/1972  . Smokeless tobacco: Never Used     Comment: Smokes 4 cigs daily  . Alcohol Use: No  . Drug Use: No     Comment: last used cocaine 11/2013. but is cocaine + this admit July 2015  . Sexual Activity: No   Other Topics Concern  . Not on file   Social History Narrative   Lives in [place] with his mother.  He does not work.    REVIEW OF SYSTEMS: Constitutional: No fevers, chills, or sweats, no generalized fatigue, change in appetite Eyes: as above Ear, nose and throat: No hearing loss, ear pain, nasal congestion, sore throat Cardiovascular: No chest pain, palpitations Respiratory:  No shortness of breath at rest or with exertion, wheezes GastrointestinaI: No nausea, vomiting, diarrhea, abdominal pain, fecal incontinence Genitourinary:  No dysuria, urinary retention or frequency Musculoskeletal:  No neck pain, back pain Integumentary: No rash, pruritus, skin lesions Neurological: as above Psychiatric: No depression, insomnia, anxiety Endocrine: No palpitations, fatigue, diaphoresis, mood swings, change in appetite, change in weight, increased thirst Hematologic/Lymphatic:  No anemia, purpura, petechiae. Allergic/Immunologic: no itchy/runny eyes, nasal congestion,  recent allergic reactions, rashes  PHYSICAL EXAM: Filed Vitals:   09/26/14 0940  BP: 88/54  Pulse: 81   General: No acute distress, comfortable with O2 nasal cannula Head:  Normocephalic/atraumatic Eyes: Fundoscopic exam shows bilateral sharp discs, no vessel changes,  exudates, or hemorrhages Neck: supple, no paraspinal tenderness, full range of motion Back: No paraspinal tenderness Heart: regular rate and rhythm Lungs: Clear to auscultation bilaterally. Vascular: + carotid bruit bilaterally noted Skin/Extremities: No rash, no edema Neurological Exam: Mental status: alert and oriented to person, place, and time, no dysarthria or aphasia, Fund of knowledge is appropriate.  Recent and remote memory are intact.  Attention and concentration are normal.    Able to name objects and repeat phrases. Cranial nerves: CN I: not tested CN II: pupils equal, round and reactive to light, visual fields intact, fundi unremarkable. CN III, IV, VI:  full range of motion, no nystagmus, no ptosis CN V: decreased cold, pin, light touch on left V 1-3, did not split midline with tuning fork or pin CN VII: upper and lower face symmetric CN VIII: hearing intact to finger rub CN IX, X: gag intact, uvula midline CN XI: sternocleidomastoid and trapezius muscles intact CN XII: tongue midline Bulk & Tone: normal, no fasciculations. Motor: 5/5 throughout with no pronator drift but note of decreased fine finger movements on the left hand Sensation: decreased light touch, pin, cold, vibration sense on the left UE and LE. Romberg test positive sway Deep Tendon Reflexes: asymmetric reflexes on the upper extremities: +1 on right UE, brisk +3 on left UE with +Hoffman sign on left. +2 on bilateral patella, absent ankle jerks bilaterally, no ankle clonus Plantar responses: mute on left, downgoing on right Cerebellar: mild ataxia on left finger to nose, slight difficulty with rapid alternating movements on the left Gait:  slow and cautious, unable to tandem but no ataxia noted Tremor: none  IMPRESSION: This is a pleasant 59 year old right-handed vascular risk factors including hypertension, hyperlipidemia, diabetes, tobacco use, CAD s/p stent, with interstitial lung disease, chronic respiratory failure, sleep apnea, renal CA s/p nephrectomy, presenting for evaluation of episodes of dizziness associated with transient left vision loss. His exam is concerning for sensory deficits on the left face, arm, leg, mild left UE ataxia, as well as hyperactive reflexes on the left arm. I am concerned about a stroke that occurred at the beginning of January when all his symptoms started. MRI brain without contrast, MRA head and neck will be ordered. With the continued episodes of dizziness with transient left vision loss, symptoms appear orthostatic, occurring with bending down or coughing. His BP today is low, he was instructed to call his PCP to discuss this and potentially adjust BP medications. Carotids will be assessed with MRA neck. An MRI C-spine without contrast will also be ordered for the left-sided hyperreflexia with Hoffman sign, typically seen with myelopathy. He was instructed to switch from aspirin to Plavix for secondary stroke prevention. He was instructed to go to ER immediately if symptoms progress or worsen. He will follow-up in 1 month.   Thank you for allowing me to participate in the care of this patient. Please do not hesitate to call for any questions or concerns.   Ellouise Newer, M.D.  CC: Dr. Chase Caller, Dr. [name]

## 2014-09-27 ENCOUNTER — Telehealth: Payer: Self-pay | Admitting: Family Medicine

## 2014-09-27 ENCOUNTER — Encounter: Payer: Self-pay | Admitting: Cardiology

## 2014-09-27 DIAGNOSIS — R42 Dizziness and giddiness: Secondary | ICD-10-CM

## 2014-09-27 DIAGNOSIS — R292 Abnormal reflex: Secondary | ICD-10-CM

## 2014-09-27 DIAGNOSIS — R200 Anesthesia of skin: Secondary | ICD-10-CM

## 2014-09-27 NOTE — Telephone Encounter (Signed)
Called patient & explained to him the below information. I did call [facility] & cancelled the MRA Neck that was scheduled, he is to proceed with the other scans that were scheduled for him. I will put in order for Carotid Dopplers. He has an appt already scheduled to see his Cardiologist/Dr. Domenic Polite in [place] for next Tuesday. I am going to call his office to see if they can do his dopplers on that day as well. Will call Dr. Doug Sou office to speak with her  Nurse to make them aware of lab results.  I called [facility] & scheduled Dopplers for Wed. 10/03/14 @ 8:00 am.  I called and spoke with Dr. Doug Sou nurse/Lisa Kellie Shropshire her the results of patients labs over the phone to let Dr. [name] know & to also let her know that patient will be calling about these results to find out what he needs to do. Also faxed results to Dr. Doug Sou attn to [phone].   I called patient back and gave him the appt information for his carotid dopplers & also reminded him of his MRI & MRA appts that are still scheduled for this up coming Monday. Again told him to call Dr. Doug Sou office she is aware of his results. I asked him to call me back if he had any further questions.

## 2014-09-27 NOTE — Telephone Encounter (Signed)
-----   Message from Cameron Sprang, MD sent at 09/27/2014 10:20 AM EST ----- Pls let him know kidney function is elevated, have him call PCP and we will forward results to PCP for evaluation. He will not be able to do MRA with contrast, so pls dc MRA neck order and let's do carotid dopplers instead. Proceed with other MRI studies without contrast. THanks

## 2014-10-02 ENCOUNTER — Ambulatory Visit (INDEPENDENT_AMBULATORY_CARE_PROVIDER_SITE_OTHER): Payer: Medicaid Other | Admitting: Cardiology

## 2014-10-02 ENCOUNTER — Encounter: Payer: Self-pay | Admitting: Cardiology

## 2014-10-02 VITALS — BP 118/70 | HR 67 | Ht 73.0 in | Wt 245.0 lb

## 2014-10-02 DIAGNOSIS — N179 Acute kidney failure, unspecified: Secondary | ICD-10-CM

## 2014-10-02 DIAGNOSIS — I2510 Atherosclerotic heart disease of native coronary artery without angina pectoris: Secondary | ICD-10-CM

## 2014-10-02 DIAGNOSIS — N189 Chronic kidney disease, unspecified: Secondary | ICD-10-CM

## 2014-10-02 DIAGNOSIS — N289 Disorder of kidney and ureter, unspecified: Secondary | ICD-10-CM

## 2014-10-02 DIAGNOSIS — E782 Mixed hyperlipidemia: Secondary | ICD-10-CM

## 2014-10-02 DIAGNOSIS — R42 Dizziness and giddiness: Secondary | ICD-10-CM

## 2014-10-02 NOTE — Patient Instructions (Signed)
Your physician recommends that you schedule a follow-up appointment in: 2 months. Your physician has recommended you make the following change in your medication:  Stop diovan. Continue all other medications the same. Please schedule an appointment with your family doctor as soon as possible to follow up on your blood pressure and kidney functioning.

## 2014-10-02 NOTE — Progress Notes (Signed)
Cardiology Office Note  Date: 10/02/2014   ID: Kevin, [name] 01-Mar-1956, MRN [id number]  PCP: [name], BETTY G, MD  Primary Cardiologist: [name], MD   Chief Complaint  Patient presents with  . Coronary Artery Disease  . Hypertension  . Hyperlipidemia  . Dizziness    History of Present Illness: [name] is a medically complex 59 y.o. male last seen in October 2015. Interval follow-up noted with Pulmonary medicine and also recently Neurology - chart reviewed. He has had some symptoms of dizziness and transient left vision loss, somewhat of an orthostatic component such as with bending down then standing up, sometimes with coughing. Neurology planned to pursue head MRI and MRA, however BUN and creatinine elevated at 39 and 2.6 respectively. Carotid Dopplers are pending.   I see that he was on lisinopril which was stopped due to increased coughing and replaced with Diovan. He has been hypotensive on recent clinic visits per chart review.  He reports no improvement in dizziness as yet. Orthostatic measurements in our office today find him to be mildly orthostatic.  From a general cardiac perspective he has been stable, with only occasional angina symptoms. Cardiac catheterization done within the last year showed reassuring findings as detailed below. He otherwise is on Plavix, Imdur, Cardizem CD, Crestor, and Lasix. He states that he if he does not take Lasix regularly, he has a drop off in his urination and increasing weight.  He continues on supplemental oxygen uses inhalers intermittently.   Past Medical History  Diagnosis Date  . Essential hypertension, benign   . TIA (transient ischemic attack) 1997    No residual neurological deficits.  . Noncompliance   . COPD (chronic obstructive pulmonary disease)   . Hyperlipidemia   . History of pneumonia   . Type 2 diabetes mellitus 2011  . GERD (gastroesophageal reflux disease)   . Substance abuse   . Cocaine  abuse     02-14-14 discusses freely with medical team, prefers no discussions with family present.  . Renal cell carcinoma     Status post radical nephrectomy August 2015  . Cholecystitis, acute 12/20/2013    Lap chole 6/5  . Coronary atherosclerosis of native coronary artery     a. 03/09/2013 Cath/PCI: LM nl, LAD: 50p, 29m (2.5x16 promus DES), LCX nl, OM1 min irregs, LPL/LPDA diff dzs, RCA nondom, mod diff dzs, EF 55%.  . Depression   . Anxiety   . History of nephrolithiasis   . Arthritis   . Hepatitis Late 1970s  . Sleep apnea     On CPAP, 4L O2    Past Surgical History  Procedure Laterality Date  . Appendectomy  1970's  . Cholecystectomy N/A 12/22/2013    Procedure: LAPAROSCOPIC CHOLECYSTECTOMY ;  Surgeon: [name], MD;  Location: [place];  Service: General;  Laterality: N/A;  . Robot assisted laparoscopic nephrectomy Right 02/21/2014    Procedure: ROBOTIC ASSISTED LAPAROSCOPIC RIGHT NEPHRECTOMY ;  Surgeon: [name], MD;  Location: [facility];  Service: Urology;  Laterality: Right;  . Laparoscopic lysis of adhesions  02/21/2014    Procedure: LAPAROSCOPIC LYSIS OF ADHESIONS EXTINSIVE;  Surgeon: [name], MD;  Location: [facility];  Service: Urology;;  . Video assisted thoracoscopy Left 05/18/2014    Procedure: LEFT VIDEO ASSISTED THORACOSCOPY;  Surgeon: [name], MD;  Location: [place];  Service: Thoracic;  Laterality: Left;  . Lung biopsy Left 05/18/2014    Procedure: LUNG BIOPSY left upper lobe &  left lower lobe;  Surgeon: [name], MD;  Location: [place];  Service: Thoracic;  Laterality: Left;  . Video bronchoscopy  05/18/2014    Procedure: VIDEO BRONCHOSCOPY;  Surgeon: [name], MD;  Location: [place];  Service: Thoracic;;  . Left heart catheterization with coronary angiogram N/A 06/02/2012    Procedure: LEFT HEART CATHETERIZATION WITH CORONARY ANGIOGRAM;  Surgeon: [name], MD;  Location: [facility] CATH LAB;  Service: Cardiovascular;   Laterality: N/A;  . Left heart catheterization with coronary angiogram N/A 03/09/2013    Procedure: LEFT HEART CATHETERIZATION WITH CORONARY ANGIOGRAM;  Surgeon: [name], MD;  Location: [facility] CATH LAB;  Service: Cardiovascular;  Laterality: N/A;  . Left heart catheterization with coronary angiogram N/A 12/20/2013    Procedure: LEFT HEART CATHETERIZATION WITH CORONARY ANGIOGRAM;  Surgeon: [name], MD;  Location: [facility] C[facility] LAB;  Service: Cardiovascular;  Laterality: N/A;    Current Outpatient Prescriptions  Medication Sig Dispense Refill  . albuterol (PROVENTIL HFA;VENTOLIN HFA) 108 (90 BASE) MCG/ACT inhaler Inhale 2 puffs into the lungs every 6 (six) hours as needed for wheezing or shortness of breath.    [name] albuterol (PROVENTIL) (2.5 MG/3ML) 0.083% nebulizer solution Take 3 mLs (2.5 mg total) by nebulization every 3 (three) hours as needed for wheezing or shortness of breath. 75 mL 12  . alfuzosin (UROXATRAL) 10 MG 24 hr tablet Take 10 mg by mouth daily with breakfast.    . budesonide-formoterol (SYMBICORT) 160-4.5 MCG/ACT inhaler Inhale 2 puffs into the lungs 2 (two) times daily. 1 Inhaler 3  . citalopram (CELEXA) 20 MG tablet Take 20 mg by mouth daily.    . clopidogrel (PLAVIX) 75 MG tablet Take 1 tablet (75 mg total) by mouth daily. 30 tablet 11  . diltiazem (CARDIZEM CD) 240 MG 24 hr capsule Take 240 mg by mouth every morning.    . Fish Oil-Cholecalciferol (FISH OIL + D3 PO) Take 1 capsule by mouth 4 (four) times daily.     [name] FLUoxetine (PROZAC) 10 MG capsule Take 10 mg by mouth every morning.    . furosemide (LASIX) 40 MG tablet Take 1 tablet (40 mg total) by mouth daily. 30 tablet 0  . gabapentin (NEURONTIN) 300 MG capsule Take 300 mg by mouth at bedtime.    [name] glipiZIDE (GLUCOTROL) 10 MG tablet Take 10 mg by mouth 2 (two) times daily.    [name] ipratropium-albuterol (DUONEB) 0.5-2.5 (3) MG/3ML SOLN Take 3 mLs by nebulization every 6 (six) hours as needed (Shortness of breath).      . isosorbide mononitrate (IMDUR) 60 MG 24 hr tablet Take 60 mg by mouth every morning.     . nitroGLYCERIN (NITROSTAT) 0.4 MG SL tablet Place 1 tablet (0.4 mg total) under the tongue every 5 (five) minutes as needed for chest pain. 30 tablet 1  . ranitidine (ZANTAC) 300 MG tablet Take 300 mg by mouth every morning.     . rosuvastatin (CRESTOR) 20 MG tablet Take 20 mg by mouth daily.    . solifenacin (VESICARE) 5 MG tablet Take 5 mg by mouth daily.    . tamsulosin (FLOMAX) 0.4 MG CAPS capsule Take 1 capsule (0.4 mg total) by mouth daily after supper. 30 capsule 1   No current facility-administered medications for this visit.    Allergies:  Review of patient's allergies indicates no known allergies.   Social History: The patient  reports that he has been smoking Cigarettes.  He started smoking about 42 years ago. He  has a 20.5 pack-year smoking history. He has never used smokeless tobacco. He reports that he does not drink alcohol or use illicit drugs.    ROS:  Please see the history of present illness. Otherwise, complete review of systems is positive for occasional headaches, feeling of anxiety, states that he uses his mother's "nerve pill".  All other systems are reviewed and negative.    Physical Exam: VS:  BP 118/70 mmHg  Pulse 67  Ht 6\' 1"  (1.854 m)  Wt 245 lb (111.131 kg)  BMI 32.33 kg/m2  SpO2 96%, BMI Body mass index is 32.33 kg/(m^2).  Wt Readings from Last 3 Encounters:  10/02/14 245 lb (111.131 kg)  09/26/14 241 lb (109.317 kg)  09/14/14 240 lb 6.4 oz (109.045 kg)     Overweight..Appears comfortable at rest. Oxygen via nasal cannula. HEENT: Conjunctiva and lids normal, oropharynx clear.  Neck: Supple, no elevated JVP or carotid bruits, no thyromegaly.  Lungs: Diminished but clear to auscultation, nonlabored breathing at rest.  Cardiac: Regular rate and rhythm, no S3, 2/6 systolic murmur, no pericardial rub.  Abdomen: Soft, nontender, bowel sounds present, no  guarding or rebound.  Extremities: No pitting edema, distal pulses 2+.  Skin: Warm and dry.  Musculoskeletal: No kyphosis.  Neuropsychiatric: Alert and oriented x3, affect grossly appropriate.   ECG: ECG is not ordered today.   Recent Labwork: 12/27/2013: TSH 0.309* 02/27/2014: Magnesium 2.0 05/20/2014: ALT 16; AST 15 05/23/2014: Pro B Natriuretic peptide (BNP) 221.3* 05/24/2014: Hemoglobin 12.8*; Platelets 224 05/25/2014: Potassium 4.2; Sodium 137 09/26/2014: BUN 39*; Creatinine 2.67*     Component Value Date/Time   CHOL 128 12/19/2013 0220   TRIG 152* 12/19/2013 0220   HDL 31* 12/19/2013 0220   CHOLHDL 4.1 12/19/2013 0220   VLDL 30 12/19/2013 0220   LDLCALC 67 12/19/2013 0220    Other Studies Reviewed Today:  1. Cardiac catheterization 12/20/2013: Hemodynamic Findings: Central aortic pressure: 120/62 Left ventricular pressure: 127/7/15  Angiographic Findings:  Left main: Short segment. No obstructive disease.   Left Anterior Descending Artery: Moderate caliber vessel that courses to the apex. There is 40% proximal stenosis. The mid stented segment is patent without restenosis. The distal vessel has diffuse non-obstructive plaque. The diagonal branches are small in caliber with no obstructive disease.   Circumflex Artery: Large dominant system with large bifurcating first obtuse marginal branch and three small caliber posterolateral branches. The OM branch has diffuse 50% proximal stenosis, no focally obstructive lesions. The three small caliber posterolateral branches all have diffuse moderate to severe stenosis that is unchanged from last cath.   Right Coronary Artery: Small non-dominant vessel with 40% mid vessel stenosis.   Left Ventricular Angiogram: LVEF=65%  Impression: 1. Double vessel CAD with patent stent mid LAD 2. Moderate non-obstructive disease in the Circumflex and RCA 3. Normal LV systolic function   2. Echocardiogram 02/25/2014: Study  Conclusions  - Left ventricle: The cavity size was normal. Wall thickness was increased in a pattern of mild LVH. Systolic function was vigorous. The estimated ejection fraction was in the range of 65% to 70%. Wall motion was normal; there were no regional wall motion abnormalities. Doppler parameters are consistent with abnormal left ventricular relaxation (grade 1 diastolic dysfunction). The E/e&' ratio is between 8-15, suggesting indeterminate LV filling pressure. - Left atrium: The atrium was normal in size.  Impressions:  - Compared to the prior echo in 2013, there are no signficant changes.   Assessment and Plan:  1. Predominantly orthostatic dizziness and transient  left visual changes. He is undergoing Neurology evaluation, chart reviewed. Carotid Dopplers are scheduled for tomorrow, although I doubt obstructive carotid disease as the etiology. With low normal blood pressure at baseline and documented orthostasis, I would recommend stopping Diovan, particularly in light of acute on chronic renal insufficiency. May even need to cut back Lasix eventually.  2. CAD status post previous DES to the LAD with patent stent site at angiography in June 2015 and otherwise moderate nonobstructive disease. Plan is to continue medical therapy and observation with intermittent angina symptoms.  3. Hyperlipidemia, last LDL 67 on Crestor.  4. Acute on chronic renal insufficiency, records show prior baseline creatinine around 1.3-1.4. As noted above, Diovan is being stopped. I recommended follow-up with Dr. [name] for repeat assessment lab work.  Current medicines are reviewed at length with the patient today.  The patient does not have concerns regarding medicines.   Disposition: FU with me in 2 months.   Signed, Satira Sark, MD, [facility] 10/02/2014 9:12 AM    [facility] [place], [place], [place] [address] Phone: [phone]; Fax:  [phone]

## 2014-10-03 ENCOUNTER — Encounter: Payer: Self-pay | Admitting: Vascular Surgery

## 2014-10-03 ENCOUNTER — Encounter: Payer: Self-pay | Admitting: *Deleted

## 2014-10-03 ENCOUNTER — Other Ambulatory Visit: Payer: Self-pay | Admitting: *Deleted

## 2014-10-03 ENCOUNTER — Ambulatory Visit (HOSPITAL_COMMUNITY)
Admission: RE | Admit: 2014-10-03 | Discharge: 2014-10-03 | Disposition: A | Discharge status: Home / Self Care | Payer: Medicaid Other | Source: Ambulatory Visit | Attending: Vascular Surgery | Admitting: Vascular Surgery

## 2014-10-03 ENCOUNTER — Ambulatory Visit (INDEPENDENT_AMBULATORY_CARE_PROVIDER_SITE_OTHER): Payer: Medicaid Other | Admitting: Vascular Surgery

## 2014-10-03 ENCOUNTER — Telehealth: Payer: Self-pay | Admitting: Neurology

## 2014-10-03 ENCOUNTER — Encounter (INDEPENDENT_AMBULATORY_CARE_PROVIDER_SITE_OTHER): Payer: Medicaid Other

## 2014-10-03 VITALS — BP 98/58 | HR 73 | Resp 20 | Ht 72.0 in | Wt 240.0 lb

## 2014-10-03 DIAGNOSIS — I6523 Occlusion and stenosis of bilateral carotid arteries: Secondary | ICD-10-CM | POA: Insufficient documentation

## 2014-10-03 DIAGNOSIS — R42 Dizziness and giddiness: Secondary | ICD-10-CM

## 2014-10-03 DIAGNOSIS — R200 Anesthesia of skin: Secondary | ICD-10-CM | POA: Diagnosis not present

## 2014-10-03 DIAGNOSIS — R9389 Abnormal findings on diagnostic imaging of other specified body structures: Secondary | ICD-10-CM

## 2014-10-03 DIAGNOSIS — R292 Abnormal reflex: Secondary | ICD-10-CM

## 2014-10-03 NOTE — Telephone Encounter (Signed)
Discussed MRI brain findings, no acute stroke. MRA head showed occlusion of the left ICA, there was flor in the right ICA and bilateral MCAs. Received carotid doppler results concerning for left ICA occluded, 80-99% right ICA stenosis. Heterogeneous plaque bilaterally. He had been switched to Plavix last week. He takes Crestor 20mg /day. He was found to have elevated creatinine and could not get contrast for MRA neck, hence dopplers were done. Discussed urgent vascular consult, and patient expressed understanding.  Tiff, pls call vascular for urgent consult, thanks

## 2014-10-03 NOTE — Progress Notes (Signed)
[facility] [place] HISTORY AND PHYSICAL   History of Present Illness:  Patient is a 59 y.o. year old male who presents for evaluation of possible symptomatic right internal carotid artery stenosis.  The patient has been having symptoms of dizziness. These primarily occur when: From sitting to standing position. These can also occur with coughing or with bending over. These amend going on for 1 month. He also has had several episodes of "passing out." However, his family states that he never is completely unconscious his body just collapses. These last for several minutes and then he recovers. He is a current smoker of 1 pack of cigarettes per day. He has no intentions of quitting. He is on Plavix. He has had one prior stroke. He states he never had any symptoms from this but they saw this on a CT scan.  Other medical problems include coronary artery disease, COPD, hyperlipidemia, diabetes, substance abuse, sleep apnea, home O2 use, pulmonary fibrosis.  Past Medical History  Diagnosis Date  . Essential hypertension, benign   . TIA (transient ischemic attack) 1997    No residual neurological deficits.  . Noncompliance   . COPD (chronic obstructive pulmonary disease)   . Hyperlipidemia   . History of pneumonia   . Type 2 diabetes mellitus 2011  . GERD (gastroesophageal reflux disease)   . Substance abuse   . Cocaine abuse     02-14-14 discusses freely with medical team, prefers no discussions with family present.  . Renal cell carcinoma     Status post radical nephrectomy August 2015  . Cholecystitis, acute 12/20/2013    Lap chole 6/5  . Coronary atherosclerosis of native coronary artery     a. 03/09/2013 Cath/PCI: LM nl, LAD: 50p, 49m (2.5x16 promus DES), LCX nl, OM1 min irregs, LPL/LPDA diff dzs, RCA nondom, mod diff dzs, EF 55%.  . Depression   . Anxiety   . History of nephrolithiasis   . Arthritis   . Hepatitis Late 1970s  . Sleep apnea     On CPAP, 4L O2    Past  Surgical History  Procedure Laterality Date  . Appendectomy  1970's  . Cholecystectomy N/A 12/22/2013    Procedure: LAPAROSCOPIC CHOLECYSTECTOMY ;  Surgeon: [name], MD;  Location: [place];  Service: General;  Laterality: N/A;  . Robot assisted laparoscopic nephrectomy Right 02/21/2014    Procedure: ROBOTIC ASSISTED LAPAROSCOPIC RIGHT NEPHRECTOMY ;  Surgeon: [name], MD;  [facility];  Service: Urology;  Laterality: Right;  . Laparoscopic lysis of adhesions  02/21/2014    Procedure: LAPAROSCOPIC LYSIS OF ADHESIONS EXTINSIVE;  Surgeon: [name], MD;  Location[facility];  Service: Urology;;  . Video assisted thoracoscopy Left 05/18/2014    Procedure: LEFT VIDEO ASSISTED THORACOSCOPY;  Surgeon: [name], MD;  Location: [place];  Service: Thoracic;  Laterality: Left;  . Lung biopsy Left 05/18/2014    Procedure: LUNG BIOPSY left upper lobe & left lower lobe;  Surgeon: [name], MD;  Location: [place];  Service: Thoracic;  Laterality: Left;  . Video bronchoscopy  05/18/2014    Procedure: VIDEO BRONCHOSCOPY;  Surgeon: [name], MD;  Location: [place];  Service: Thoracic;;  . Left heart catheterization with coronary angiogram N/A 06/02/2012    Procedure: LEFT HEART CATHETERIZATION WITH CORONARY ANGIOGRAM;  Surgeon: [name], MD;  Location: [facility] CATH LAB;  Service: Cardiovascular;  Laterality: N/A;  . Left heart catheterization with coronary angiogram N/A 03/09/2013  Procedure: LEFT HEART CATHETERIZATION WITH CORONARY ANGIOGRAM;  Surgeon: [name], MD;  Location: [facility] CATH LAB;  Service: Cardiovascular;  Laterality: N/A;  . Left heart catheterization with coronary angiogram N/A 12/20/2013    Procedure: LEFT HEART CATHETERIZATION WITH CORONARY ANGIOGRAM;  Surgeon: [name], MD;  Location: [facility] CATH LAB;  Service: Cardiovascular;  Laterality: N/A;    Social History History  Substance Use Topics  . Smoking status: Current Every Day  Smoker -- 0.50 packs/day for 41 years    Types: Cigarettes    Start date: 05/03/1972  . Smokeless tobacco: Never Used     Comment: smokes 1/2 pack daily  . Alcohol Use: No    Family History Family History  Problem Relation Age of Onset  . Hypertension    . Diabetes    . Stroke    . Lung cancer Father   . Cancer Mother     Thyroid - living in her 67's.  . Cancer Maternal Grandmother     Breast  . Cancer Maternal Grandfather     Throat and stomach  . CAD Father   . CAD Brother     Allergies  No Known Allergies   Current Outpatient Prescriptions  Medication Sig Dispense Refill  . albuterol (PROVENTIL HFA;VENTOLIN HFA) 108 (90 BASE) MCG/ACT inhaler Inhale 2 puffs into the lungs every 6 (six) hours as needed for wheezing or shortness of breath.    [name] albuterol (PROVENTIL) (2.5 MG/3ML) 0.083% nebulizer solution Take 3 mLs (2.5 mg total) by nebulization every 3 (three) hours as needed for wheezing or shortness of breath. 75 mL 12  . alfuzosin (UROXATRAL) 10 MG 24 hr tablet Take 10 mg by mouth daily with breakfast.    . budesonide-formoterol (SYMBICORT) 160-4.5 MCG/ACT inhaler Inhale 2 puffs into the lungs 2 (two) times daily. 1 Inhaler 3  . citalopram (CELEXA) 20 MG tablet Take 20 mg by mouth daily.    . clopidogrel (PLAVIX) 75 MG tablet Take 1 tablet (75 mg total) by mouth daily. 30 tablet 11  . diltiazem (CARDIZEM CD) 240 MG 24 hr capsule Take 240 mg by mouth every morning.    . Fish Oil-Cholecalciferol (FISH OIL + D3 PO) Take 1 capsule by mouth 4 (four) times daily.     [name] FLUoxetine (PROZAC) 10 MG capsule Take 10 mg by mouth every morning.    . furosemide (LASIX) 40 MG tablet Take 1 tablet (40 mg total) by mouth daily. 30 tablet 0  . gabapentin (NEURONTIN) 300 MG capsule Take 300 mg by mouth at bedtime.    [name] glipiZIDE (GLUCOTROL) 10 MG tablet Take 10 mg by mouth 2 (two) times daily.    [name] ipratropium-albuterol (DUONEB) 0.5-2.5 (3) MG/3ML SOLN Take 3 mLs by nebulization every 6  (six) hours as needed (Shortness of breath).    . isosorbide mononitrate (IMDUR) 60 MG 24 hr tablet Take 60 mg by mouth every morning.     . nitroGLYCERIN (NITROSTAT) 0.4 MG SL tablet Place 1 tablet (0.4 mg total) under the tongue every 5 (five) minutes as needed for chest pain. 30 tablet 1  . ranitidine (ZANTAC) 300 MG tablet Take 300 mg by mouth every morning.     . rosuvastatin (CRESTOR) 20 MG tablet Take 20 mg by mouth daily.    . solifenacin (VESICARE) 5 MG tablet Take 5 mg by mouth daily.    . tamsulosin (FLOMAX) 0.4 MG CAPS capsule Take 1 capsule (0.4 mg total) by mouth daily after supper. [place]  capsule 1   No current facility-administered medications for this visit.    ROS:   General:  No weight loss, Fever, chills  HEENT: No recent headaches, no nasal bleeding, no visual changes, no sore throat  Neurologic: + dizziness, no blackouts, no seizures. No recent symptoms of stroke or mini- stroke. No recent episodes of slurred speech, or temporary blindness.  Cardiac: No recent episodes of chest pain/pressure, no shortness of breath at rest.  + shortness of breath with exertion.  Denies history of atrial fibrillation or irregular heartbeat  Vascular: No history of rest pain in feet.  No history of claudication.  No history of non-healing ulcer, No history of DVT   Pulmonary: + home oxygen, no productive cough, no hemoptysis,  No asthma or wheezing  Musculoskeletal:  [ ]  Arthritis, [ ]  Low back pain,  [ ]  Joint pain  Hematologic:No history of hypercoagulable state.  No history of easy bleeding.  No history of anemia  Gastrointestinal: No hematochezia or melena,  No gastroesophageal reflux, no trouble swallowing  Urinary: [ ]  chronic Kidney disease, [ ]  on HD - [ ]  MWF or [ ]  TTHS, [ ]  Burning with urination, [ ]  Frequent urination, [ ]  Difficulty urinating;   Skin: No rashes  Psychological: + history of anxiety,  + history of depression   Physical Examination  Filed Vitals:    10/03/14 1619 10/03/14 1620  BP: 104/63 98/58  Pulse: 72 73  Resp: 20   Height: 6' (1.829 m)   Weight: 240 lb (108.863 kg)     Body mass index is 32.54 kg/(m^2).  General:  Alert and oriented, no acute distress HEENT: Normal Neck: Bilateral soft carotid bruits Pulmonary: Clear to auscultation bilaterally Cardiac: Regular Rate and Rhythm without murmur Abdomen: Soft, non-tender, non-distended, no mass Skin: No rash Extremity Pulses:  2+ radial, brachial, femoral, dorsalis pedis  pulses bilaterally Musculoskeletal: No deformity or edema  Neurologic: Upper and lower extremity motor 5/5 and symmetric  DATA:  A duplex scan was performed in our office today which shows greater than 80% right internal carotid artery stenosis, occluded left internal carotid artery velocities on the right side 311/122. Brachial artery pressures were equal. There was antegrade vertebral flow bilaterally. I reviewed and interpreted this study.   ASSESSMENT:  Left internal carotid artery occlusion with high-grade right internal carotid artery stenosis. Possible symptomatic with drop attacks due to cerebral global hypoperfusion.   PLAN:  Right carotid endarterectomy for stroke prophylaxis by my partner Dr. Donnetta Hutching on March 21. Risks benefits possible complications and procedure details including but not limited to bleeding infection cranial nerve injury stroke risk myocardial event were explained the patient today. He understands and agrees to proceed.  Ruta Hinds, MD [facility] [place] Office: [phone] Pager: [phone]

## 2014-10-03 NOTE — Telephone Encounter (Signed)
I have put an urgent order in Epic to [facility], I have also called the office about appt. Left my name & number for scheduler to call me back to schedule.

## 2014-10-03 NOTE — Progress Notes (Signed)
Patient seen yesterday for a follow-up visit. Reporting problems with orthostatic dizziness as well as an episode of transient left visual changes. He was noted to be mildly orthostatic, and we had him stop Diovan to see if this would help. He had already been scheduled by Dr. Delice Lesch with Neurology for carotid artery studies and a brain MRI. I reviewed his chart today. Notes indicate no acute stroke by brain MRI, however his carotid Dopplers are significantly abnormal with 80-99% RICA stenosis and occluded LICA. Although I do not think that this is an explanation for his dizziness, certainly his visual changes could be related, and I would agree that he needs urgent Vascular consultation to discuss options for management. Our office went ahead and made a referral, however I see that Dr. Delice Lesch already took care of this as well.  Satira Sark, M.D., F.A.C.C.

## 2014-10-03 NOTE — Progress Notes (Signed)
Need urgent referral to a vascular doctor per [facility].

## 2014-10-03 NOTE — Telephone Encounter (Signed)
He is being worked in today with Dr. Oneida Alar @ 4:00 pm.

## 2014-10-04 ENCOUNTER — Other Ambulatory Visit: Payer: Self-pay

## 2014-10-05 ENCOUNTER — Encounter (HOSPITAL_COMMUNITY): Payer: Self-pay

## 2014-10-05 ENCOUNTER — Encounter (HOSPITAL_COMMUNITY)
Admission: RE | Admit: 2014-10-05 | Discharge: 2014-10-05 | Disposition: A | Discharge status: Home / Self Care | Payer: Medicaid Other | Source: Ambulatory Visit | Attending: Vascular Surgery | Admitting: Vascular Surgery

## 2014-10-05 DIAGNOSIS — F17210 Nicotine dependence, cigarettes, uncomplicated: Secondary | ICD-10-CM | POA: Insufficient documentation

## 2014-10-05 DIAGNOSIS — Z01812 Encounter for preprocedural laboratory examination: Secondary | ICD-10-CM | POA: Insufficient documentation

## 2014-10-05 DIAGNOSIS — I10 Essential (primary) hypertension: Secondary | ICD-10-CM | POA: Insufficient documentation

## 2014-10-05 DIAGNOSIS — K219 Gastro-esophageal reflux disease without esophagitis: Secondary | ICD-10-CM

## 2014-10-05 DIAGNOSIS — Z01818 Encounter for other preprocedural examination: Secondary | ICD-10-CM | POA: Insufficient documentation

## 2014-10-05 DIAGNOSIS — Z79899 Other long term (current) drug therapy: Secondary | ICD-10-CM

## 2014-10-05 DIAGNOSIS — Z7902 Long term (current) use of antithrombotics/antiplatelets: Secondary | ICD-10-CM

## 2014-10-05 DIAGNOSIS — G4733 Obstructive sleep apnea (adult) (pediatric): Secondary | ICD-10-CM

## 2014-10-05 DIAGNOSIS — Z905 Acquired absence of kidney: Secondary | ICD-10-CM

## 2014-10-05 DIAGNOSIS — I2510 Atherosclerotic heart disease of native coronary artery without angina pectoris: Secondary | ICD-10-CM

## 2014-10-05 DIAGNOSIS — I6523 Occlusion and stenosis of bilateral carotid arteries: Secondary | ICD-10-CM

## 2014-10-05 DIAGNOSIS — Z9981 Dependence on supplemental oxygen: Secondary | ICD-10-CM

## 2014-10-05 DIAGNOSIS — J449 Chronic obstructive pulmonary disease, unspecified: Secondary | ICD-10-CM

## 2014-10-05 DIAGNOSIS — F1410 Cocaine abuse, uncomplicated: Secondary | ICD-10-CM

## 2014-10-05 DIAGNOSIS — E119 Type 2 diabetes mellitus without complications: Secondary | ICD-10-CM

## 2014-10-05 DIAGNOSIS — Z0183 Encounter for blood typing: Secondary | ICD-10-CM

## 2014-10-05 DIAGNOSIS — Z85528 Personal history of other malignant neoplasm of kidney: Secondary | ICD-10-CM | POA: Insufficient documentation

## 2014-10-05 HISTORY — DX: Headache: R51

## 2014-10-05 HISTORY — DX: Headache, unspecified: R51.9

## 2014-10-05 LAB — TYPE AND SCREEN
ABO/RH(D): B POS
ANTIBODY SCREEN: NEGATIVE

## 2014-10-05 LAB — COMPREHENSIVE METABOLIC PANEL
ALT: 13 U/L (ref 0–53)
ANION GAP: 12 (ref 5–15)
AST: 15 U/L (ref 0–37)
Albumin: 3.7 g/dL (ref 3.5–5.2)
Alkaline Phosphatase: 72 U/L (ref 39–117)
BUN: 25 mg/dL — ABNORMAL HIGH (ref 6–23)
CO2: 21 mmol/L (ref 19–32)
CREATININE: 2.25 mg/dL — AB (ref 0.50–1.35)
Calcium: 9.3 mg/dL (ref 8.4–10.5)
Chloride: 103 mmol/L (ref 96–112)
GFR calc non Af Amer: 30 mL/min — ABNORMAL LOW (ref >90)
GFR, EST AFRICAN AMERICAN: 35 mL/min — AB (ref >90)
Glucose, Bld: 157 mg/dL — ABNORMAL HIGH (ref 70–99)
Potassium: 4.6 mmol/L (ref 3.5–5.1)
Sodium: 136 mmol/L (ref 135–145)
Total Bilirubin: 0.7 mg/dL (ref 0.3–1.2)
Total Protein: 7.5 g/dL (ref 6.0–8.3)

## 2014-10-05 LAB — URINALYSIS, ROUTINE W REFLEX MICROSCOPIC
BILIRUBIN URINE: NEGATIVE
GLUCOSE, UA: NEGATIVE mg/dL
HGB URINE DIPSTICK: NEGATIVE
KETONES UR: NEGATIVE mg/dL
Leukocytes, UA: NEGATIVE
Nitrite: NEGATIVE
PH: 5.5 (ref 5.0–8.0)
PROTEIN: NEGATIVE mg/dL
Specific Gravity, Urine: 1.007 (ref 1.005–1.030)
Urobilinogen, UA: 0.2 mg/dL (ref 0.0–1.0)

## 2014-10-05 LAB — CBC
HCT: 39 % (ref 39.0–52.0)
HEMOGLOBIN: 12.8 g/dL — AB (ref 13.0–17.0)
MCH: 28.2 pg (ref 26.0–34.0)
MCHC: 32.8 g/dL (ref 30.0–36.0)
MCV: 85.9 fL (ref 78.0–100.0)
Platelets: 214 10*3/uL (ref 150–400)
RBC: 4.54 MIL/uL (ref 4.22–5.81)
RDW: 15 % (ref 11.5–15.5)
WBC: 10.8 10*3/uL — ABNORMAL HIGH (ref 4.0–10.5)

## 2014-10-05 LAB — SURGICAL PCR SCREEN
MRSA, PCR: NEGATIVE
STAPHYLOCOCCUS AUREUS: NEGATIVE

## 2014-10-05 LAB — PROTIME-INR
INR: 1.05 (ref 0.00–1.49)
Prothrombin Time: 13.8 seconds (ref 11.6–15.2)

## 2014-10-05 LAB — APTT: APTT: 32 s (ref 24–37)

## 2014-10-05 NOTE — Progress Notes (Signed)
Anesthesia Chart Review: Patient is a 59 year old male scheduled for right CEA on 10/08/14 by Dr. Donnetta Hutching. He was recently found to have occluded LICA and 29-19% RICA stenosis.    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 Minute focus [2 mm] of well differentiated mucinous carcinoma in situ, completely excised ) 05/18/14 (required post-op CCM consult due to post-operative hypoxia treated with diuresis, pulmonary medications (beta 2 agonist, inhaled steroids), and BiPAP Q HS and PRN day), COPD with home O2 (4-6L/[place]), HTN, DM2, GERD, CAD s/p mid LAD stent 02/2013, OSA, renal cell carcinoma s/p right nephrectomy (hospital course complicated by hypoxic respiratory failure and delirium) 02/21/14, anxiety, depression, hepatitis (not specified), cholecystectomy, TIA, non-compliance, polysubstance abuse including cocaine (+ UDS 12/18/13; denied use since last year). BMI is consistent with obesity.  PCP is Dr. Betty [name]. Pulmonologist is Dr. Chase Caller, last visit with Rexene Edison, NP on 09/14/14 for mild COPD flare. ILD was felt stable. Cardiologist is Dr. Domenic Polite, last visit 10/02/14 who recommended urgent vascular surgery consultations due to severe RICA stenosis and occluded LICA with recent visual changes and dizziness.   Meds include albuterol, Uroxatral, Symbicort, Celexa, Plavix, Cardizem, Prozac, Lasix, Neurontin, glipizide, Duoneb, Imdur, Nitro, Crestor, Vesicare, Flomax. Patient reported that he was told to remain on Plavix.  PAT Vitals: BP 91/51. HR 74 HR. RR 20. T 36.7.  O2 sat (RA) was 94% (however, because patient reported being on up to 6L/ O2 at home, O2 was used during his RN PAT interview).  02/25/14 Echo:  - Left ventricle: The cavity size was normal. Wall thickness was increased in a pattern of mild LVH. Systolic function was vigorous. The estimated  ejection fraction was in the range of 65% to 70%. Wall motion was normal; there were no regional wall motion abnormalities. Doppler parameters are consistent with abnormal left ventricular relaxation (grade 1 diastolic dysfunction). The E/e&' ratio is between 8-15, suggesting indeterminate LV filling pressure. - Left atrium: The atrium was normal in size. Impressions: Compared to the prior echo in 2013, there are no significant changes.  12/20/13 Cardiac cath:  1. Double vessel CAD with patent stent mid LAD (40% proximal LAD) 2. Moderate non-obstructive disease in the Circumflex (50% OM) and RCA (40% mid RCA) 3. Normal LV systolic function, EF 16%  4. Ongoing tobacco abuse  5. Ongoing cocaine abuse  Recommendations: Continue medical management of CAD. It is likely that his exertional chest pain is related to small vessel disease that is worsened with cocaine use and tobacco use. Complete cessation of cocaine and tobacco recommended.   05/17/14 EKG: NSR, low voltage QRS, possible inferior infarct (age undetermined)  10/03/14 Carotid duplex: 80-99% RICA stenosis. Occluded proximal RECA with collateral flow distally. LICA occluded. > 50% LECA stenosis. Normal SCA bilaterally. Patent vertebral arteries with antegrade flow.  07/30/14 Polysomnogram: IMPRESSION/ RECOMMENDATION:  This study shows moderate obstructive sleep apnea with an AHI of 16.1 and SaO2 low of 87%. Please note the study was conducted with the patient using his baseline home oxygen setting of 4 liters. I would recommend the patient return to the sleep lab for a titration study. He should start on CPAP and then can have supplemental oxygen adjusted as needed. He might also need to be transitioned to BiPAP. (Dr. Halford Chessman)  09/07/14 CXR: IMPRESSION: There is no pneumonia nor other acute cardiopulmonary abnormality. Interval clearing of the left pleural effusion.  08/29/14 Chest  CT High Resolution:  1. Very subtle upper lobe predominant  peribronchovascular ground-glass, consistent with biopsy-proven desquamative interstitial pneumonitis (DIP). No evidence of disease progression. 2. Three-vessel coronary artery calcification.  09/07/14 Pulmonary function testing  FVC 4.03 (83%)  FEV1 3.28 (90%)  FEV1/FVC 0.81 (107%) TLC 6.36 (90%)  DLCO 16.41 (50%)  Preoperative labs noted. Cr 2.25, down from 2.67 on 09/26/14, but previous Cr range had been ~ 1.4 - 1.8 range towards the end of last year. Glucose 157.  AST/ALT, PT/PTT WNL. H/H 12.8/39.0. Will plan to check an ISTAT8 on arrival to ensure renal function is stable.  He has had recent cardiology follow-up which prompted urgent [facility] referral for treatment of severe carotid stenosis. No additional cardiac studies was recommended prior to his lung surgery in 04/2014.  He has required CCM/Pulmonary post-operative evaluation following his last two surgeries and unfortunately continues to smoke. He may need post-operative pulmonary consult--defer to surgeon and/or anesthesiologist.   Discussed above with anesthesiologist Dr. Al Corpus.  If renal function is stable and no acute cardiopulmonary status is stable then anticipate that he can proceed as planned.   [facility]/Anesthesiology Phone [phone] 10/05/2014 3:29 PM

## 2014-10-05 NOTE — Pre-Procedure Instructions (Addendum)
[name]  10/05/2014   Your procedure is scheduled on:  10/08/14  Report to [facility] short stay admitting at 700 AM.  Call this number if you have problems the morning of surgery: [phone]   Remember:   Do not eat food or drink liquids after midnight.   Take these medicines the morning of surgery with A SIP OF WATER: inhalers/neb if needed, uroxatrol,zantac, celexa, imdur, diltiazem,prozac,nitro if needed, plavix per dr early    STOP all herbel meds, nsaids (aleve,naproxen,advil,ibuprofen) starting now including vitamins, fish oil         Plavix per dr   No diabetic med am of surgery   Do not wear jewelry, make-up or nail polish.  Do not wear lotions, powders, or perfumes. You may wear deodorant.  Do not shave 48 hours prior to surgery. Men may shave face and neck.  Do not bring valuables to the hospital.  [facility] is not responsible                  for any belongings or valuables.               Contacts, dentures or bridgework may not be worn into surgery.  Leave suitcase in the car. After surgery it may be brought to your room.  For patients admitted to the hospital, discharge time is determined by your                treatment team.               Patients discharged the day of surgery will not be allowed to drive  home.  Name and phone number of your driver:   Special Instructions:  Special Instructions: [place] - Preparing for Surgery  Before surgery, you can play an important role.  Because skin is not sterile, your skin needs to be as free of germs as possible.  You can reduce the number of germs on you skin by washing with CHG (chlorahexidine gluconate) soap before surgery.  CHG is an antiseptic cleaner which kills germs and bonds with the skin to continue killing germs even after washing.  Please DO NOT use if you have an allergy to CHG or antibacterial soaps.  If your skin becomes reddened/irritated stop using the CHG and inform your nurse when you arrive at  Short Stay.  Do not shave (including legs and underarms) for at least 48 hours prior to the first CHG shower.  You may shave your face.  Please follow these instructions carefully:   1.  Shower with CHG Soap the night before surgery and the morning of Surgery.  2.  If you choose to wash your hair, wash your hair first as usual with your normal shampoo.  3.  After you shampoo, rinse your hair and body thoroughly to remove the Shampoo.  4.  Use CHG as you would any other liquid soap.  You can apply chg directly  to the skin and wash gently with scrungie or a clean washcloth.  5.  Apply the CHG Soap to your body ONLY FROM THE NECK DOWN.  Do not use on open wounds or open sores.  Avoid contact with your eyes ears, mouth and genitals (private parts).  Wash genitals (private parts)       with your normal soap.  6.  Wash thoroughly, paying special attention to the area where your surgery will be performed.  7.  Thoroughly rinse your body with warm  water from the neck down.  8.  DO NOT shower/wash with your normal soap after using and rinsing off the CHG Soap.  9.  Pat yourself dry with a clean towel.            10.  Wear clean pajamas.            11.  Place clean sheets on your bed the night of your first shower and do not sleep with pets.  Day of Surgery  Do not apply any lotions/deodorants the morning of surgery.  Please wear clean clothes to the hospital/surgery center.   Please read over the following fact sheets that you were given: Pain Booklet, Coughing and Deep Breathing, Blood Transfusion Information, MRSA Information and Surgical Site Infection Prevention

## 2014-10-05 NOTE — Progress Notes (Signed)
Patient came in without O2 but stated used all the time. His ran out on the way per patient. O2 tank provided while pat here at 6L( per patient )

## 2014-10-07 MED ORDER — DEXTROSE 5 % IV SOLN
1.5000 g | INTRAVENOUS | Status: DC
  Filled 2014-10-07: qty 1.5

## 2014-10-07 MED ORDER — SODIUM CHLORIDE 0.9 % IV SOLN: INTRAVENOUS | Status: DC

## 2014-10-08 ENCOUNTER — Encounter (HOSPITAL_COMMUNITY): Payer: Self-pay | Admitting: Certified Registered Nurse Anesthetist

## 2014-10-08 ENCOUNTER — Telehealth: Payer: Self-pay | Admitting: Vascular Surgery

## 2014-10-08 ENCOUNTER — Inpatient Hospital Stay (HOSPITAL_COMMUNITY): Payer: Medicaid Other | Admitting: Vascular Surgery

## 2014-10-08 ENCOUNTER — Inpatient Hospital Stay (HOSPITAL_COMMUNITY): Payer: Medicaid Other | Admitting: Certified Registered Nurse Anesthetist

## 2014-10-08 ENCOUNTER — Inpatient Hospital Stay (HOSPITAL_COMMUNITY)
Admission: RE | Admit: 2014-10-08 | Discharge: 2014-10-13 | DRG: 037 | Disposition: A | Discharge status: Home / Self Care | Payer: Medicaid Other | Source: Ambulatory Visit | Attending: Vascular Surgery | Admitting: Vascular Surgery

## 2014-10-08 ENCOUNTER — Encounter (HOSPITAL_COMMUNITY)
Admission: RE | Disposition: A | Discharge status: Home / Self Care | Payer: Self-pay | Source: Ambulatory Visit | Attending: Vascular Surgery

## 2014-10-08 DIAGNOSIS — N170 Acute kidney failure with tubular necrosis: Secondary | ICD-10-CM | POA: Diagnosis not present

## 2014-10-08 DIAGNOSIS — E785 Hyperlipidemia, unspecified: Secondary | ICD-10-CM | POA: Diagnosis present

## 2014-10-08 DIAGNOSIS — R339 Retention of urine, unspecified: Secondary | ICD-10-CM | POA: Diagnosis not present

## 2014-10-08 DIAGNOSIS — G4733 Obstructive sleep apnea (adult) (pediatric): Secondary | ICD-10-CM | POA: Diagnosis present

## 2014-10-08 DIAGNOSIS — Z66 Do not resuscitate: Secondary | ICD-10-CM | POA: Diagnosis present

## 2014-10-08 DIAGNOSIS — F419 Anxiety disorder, unspecified: Secondary | ICD-10-CM | POA: Diagnosis present

## 2014-10-08 DIAGNOSIS — Z9119 Patient's noncompliance with other medical treatment and regimen: Secondary | ICD-10-CM | POA: Diagnosis present

## 2014-10-08 DIAGNOSIS — F17210 Nicotine dependence, cigarettes, uncomplicated: Secondary | ICD-10-CM | POA: Diagnosis present

## 2014-10-08 DIAGNOSIS — F418 Other specified anxiety disorders: Secondary | ICD-10-CM | POA: Diagnosis present

## 2014-10-08 DIAGNOSIS — J9621 Acute and chronic respiratory failure with hypoxia: Secondary | ICD-10-CM | POA: Diagnosis not present

## 2014-10-08 DIAGNOSIS — Z8673 Personal history of transient ischemic attack (TIA), and cerebral infarction without residual deficits: Secondary | ICD-10-CM

## 2014-10-08 DIAGNOSIS — I6529 Occlusion and stenosis of unspecified carotid artery: Secondary | ICD-10-CM | POA: Diagnosis present

## 2014-10-08 DIAGNOSIS — J449 Chronic obstructive pulmonary disease, unspecified: Secondary | ICD-10-CM | POA: Diagnosis present

## 2014-10-08 DIAGNOSIS — Z7902 Long term (current) use of antithrombotics/antiplatelets: Secondary | ICD-10-CM | POA: Diagnosis not present

## 2014-10-08 DIAGNOSIS — E872 Acidosis: Secondary | ICD-10-CM | POA: Diagnosis not present

## 2014-10-08 DIAGNOSIS — E119 Type 2 diabetes mellitus without complications: Secondary | ICD-10-CM | POA: Diagnosis present

## 2014-10-08 DIAGNOSIS — Z9114 Patient's other noncompliance with medication regimen: Secondary | ICD-10-CM | POA: Diagnosis present

## 2014-10-08 DIAGNOSIS — I120 Hypertensive chronic kidney disease with stage 5 chronic kidney disease or end stage renal disease: Secondary | ICD-10-CM | POA: Diagnosis present

## 2014-10-08 DIAGNOSIS — J441 Chronic obstructive pulmonary disease with (acute) exacerbation: Secondary | ICD-10-CM

## 2014-10-08 DIAGNOSIS — F1910 Other psychoactive substance abuse, uncomplicated: Secondary | ICD-10-CM | POA: Diagnosis not present

## 2014-10-08 DIAGNOSIS — Z79899 Other long term (current) drug therapy: Secondary | ICD-10-CM

## 2014-10-08 DIAGNOSIS — E875 Hyperkalemia: Secondary | ICD-10-CM | POA: Diagnosis not present

## 2014-10-08 DIAGNOSIS — F411 Generalized anxiety disorder: Secondary | ICD-10-CM | POA: Diagnosis present

## 2014-10-08 DIAGNOSIS — G9341 Metabolic encephalopathy: Secondary | ICD-10-CM | POA: Diagnosis not present

## 2014-10-08 DIAGNOSIS — J690 Pneumonitis due to inhalation of food and vomit: Secondary | ICD-10-CM | POA: Diagnosis not present

## 2014-10-08 DIAGNOSIS — N183 Chronic kidney disease, stage 3 unspecified: Secondary | ICD-10-CM | POA: Diagnosis present

## 2014-10-08 DIAGNOSIS — Z8249 Family history of ischemic heart disease and other diseases of the circulatory system: Secondary | ICD-10-CM | POA: Diagnosis not present

## 2014-10-08 DIAGNOSIS — E1122 Type 2 diabetes mellitus with diabetic chronic kidney disease: Secondary | ICD-10-CM | POA: Diagnosis present

## 2014-10-08 DIAGNOSIS — J849 Interstitial pulmonary disease, unspecified: Secondary | ICD-10-CM | POA: Diagnosis present

## 2014-10-08 DIAGNOSIS — N179 Acute kidney failure, unspecified: Secondary | ICD-10-CM | POA: Diagnosis present

## 2014-10-08 DIAGNOSIS — E1165 Type 2 diabetes mellitus with hyperglycemia: Secondary | ICD-10-CM | POA: Diagnosis present

## 2014-10-08 DIAGNOSIS — K219 Gastro-esophageal reflux disease without esophagitis: Secondary | ICD-10-CM | POA: Diagnosis present

## 2014-10-08 DIAGNOSIS — Z85528 Personal history of other malignant neoplasm of kidney: Secondary | ICD-10-CM | POA: Diagnosis not present

## 2014-10-08 DIAGNOSIS — N189 Chronic kidney disease, unspecified: Secondary | ICD-10-CM | POA: Diagnosis present

## 2014-10-08 DIAGNOSIS — Z823 Family history of stroke: Secondary | ICD-10-CM | POA: Diagnosis not present

## 2014-10-08 DIAGNOSIS — G4730 Sleep apnea, unspecified: Secondary | ICD-10-CM | POA: Diagnosis present

## 2014-10-08 DIAGNOSIS — Z905 Acquired absence of kidney: Secondary | ICD-10-CM | POA: Diagnosis present

## 2014-10-08 DIAGNOSIS — I10 Essential (primary) hypertension: Secondary | ICD-10-CM | POA: Diagnosis not present

## 2014-10-08 DIAGNOSIS — I959 Hypotension, unspecified: Secondary | ICD-10-CM | POA: Diagnosis not present

## 2014-10-08 DIAGNOSIS — Z801 Family history of malignant neoplasm of trachea, bronchus and lung: Secondary | ICD-10-CM | POA: Diagnosis not present

## 2014-10-08 DIAGNOSIS — I2510 Atherosclerotic heart disease of native coronary artery without angina pectoris: Secondary | ICD-10-CM | POA: Diagnosis present

## 2014-10-08 DIAGNOSIS — E782 Mixed hyperlipidemia: Secondary | ICD-10-CM | POA: Diagnosis present

## 2014-10-08 DIAGNOSIS — R0602 Shortness of breath: Secondary | ICD-10-CM

## 2014-10-08 DIAGNOSIS — Z720 Tobacco use: Secondary | ICD-10-CM | POA: Diagnosis not present

## 2014-10-08 DIAGNOSIS — I6523 Occlusion and stenosis of bilateral carotid arteries: Secondary | ICD-10-CM | POA: Diagnosis not present

## 2014-10-08 DIAGNOSIS — R34 Anuria and oliguria: Secondary | ICD-10-CM | POA: Diagnosis not present

## 2014-10-08 DIAGNOSIS — J438 Other emphysema: Secondary | ICD-10-CM | POA: Diagnosis not present

## 2014-10-08 DIAGNOSIS — N186 End stage renal disease: Secondary | ICD-10-CM | POA: Diagnosis present

## 2014-10-08 DIAGNOSIS — Z833 Family history of diabetes mellitus: Secondary | ICD-10-CM | POA: Diagnosis not present

## 2014-10-08 DIAGNOSIS — Z955 Presence of coronary angioplasty implant and graft: Secondary | ICD-10-CM | POA: Diagnosis not present

## 2014-10-08 DIAGNOSIS — J9601 Acute respiratory failure with hypoxia: Secondary | ICD-10-CM | POA: Diagnosis not present

## 2014-10-08 DIAGNOSIS — M1990 Unspecified osteoarthritis, unspecified site: Secondary | ICD-10-CM | POA: Diagnosis present

## 2014-10-08 DIAGNOSIS — IMO0002 Reserved for concepts with insufficient information to code with codable children: Secondary | ICD-10-CM | POA: Diagnosis present

## 2014-10-08 DIAGNOSIS — I6521 Occlusion and stenosis of right carotid artery: Secondary | ICD-10-CM | POA: Diagnosis present

## 2014-10-08 DIAGNOSIS — F32A Depression, unspecified: Secondary | ICD-10-CM | POA: Diagnosis present

## 2014-10-08 DIAGNOSIS — Z8701 Personal history of pneumonia (recurrent): Secondary | ICD-10-CM | POA: Diagnosis not present

## 2014-10-08 DIAGNOSIS — F329 Major depressive disorder, single episode, unspecified: Secondary | ICD-10-CM | POA: Diagnosis present

## 2014-10-08 DIAGNOSIS — C649 Malignant neoplasm of unspecified kidney, except renal pelvis: Secondary | ICD-10-CM | POA: Diagnosis present

## 2014-10-08 DIAGNOSIS — J439 Emphysema, unspecified: Secondary | ICD-10-CM | POA: Diagnosis present

## 2014-10-08 HISTORY — PX: ENDARTERECTOMY: SHX5162

## 2014-10-08 HISTORY — PX: PATCH ANGIOPLASTY: SHX6230

## 2014-10-08 LAB — GLUCOSE, CAPILLARY
GLUCOSE-CAPILLARY: 134 mg/dL — AB (ref 70–99)
GLUCOSE-CAPILLARY: 176 mg/dL — AB (ref 70–99)
Glucose-Capillary: 100 mg/dL — ABNORMAL HIGH (ref 70–99)
Glucose-Capillary: 120 mg/dL — ABNORMAL HIGH (ref 70–99)

## 2014-10-08 SURGERY — ENDARTERECTOMY, CAROTID
Anesthesia: General | Site: Neck | Laterality: Right

## 2014-10-08 MED ORDER — MORPHINE SULFATE 2 MG/ML IJ SOLN
2.0000 mg | INTRAMUSCULAR | Status: DC | PRN
  Administered 2014-10-09: 2 mg via INTRAVENOUS
  Filled 2014-10-08: qty 1

## 2014-10-08 MED ORDER — HEPARIN SODIUM (PORCINE) 1000 UNIT/ML IJ SOLN
INTRAMUSCULAR | Status: DC | PRN
  Administered 2014-10-08: 9000 [IU] via INTRAVENOUS

## 2014-10-08 MED ORDER — NITROGLYCERIN IN D5W 200-5 MCG/ML-% IV SOLN
0.0000 ug/min | INTRAVENOUS | Status: DC
  Filled 2014-10-08: qty 250

## 2014-10-08 MED ORDER — ALBUTEROL SULFATE (2.5 MG/3ML) 0.083% IN NEBU: 2.0000 mL | INHALATION_SOLUTION | Freq: Four times a day (QID) | RESPIRATORY_TRACT | Status: DC | PRN

## 2014-10-08 MED ORDER — PROMETHAZINE HCL 25 MG/ML IJ SOLN: 6.2500 mg | INTRAMUSCULAR | Status: DC | PRN

## 2014-10-08 MED ORDER — ACETAMINOPHEN 325 MG PO TABS
325.0000 mg | ORAL_TABLET | ORAL | Status: DC | PRN
  Administered 2014-10-08 – 2014-10-12 (×3): 650 mg via ORAL
  Filled 2014-10-08 (×3): qty 2

## 2014-10-08 MED ORDER — EPHEDRINE SULFATE 50 MG/ML IJ SOLN
INTRAMUSCULAR | Status: AC
  Filled 2014-10-08: qty 1

## 2014-10-08 MED ORDER — SODIUM CHLORIDE 0.9 % IR SOLN
Status: DC | PRN
  Administered 2014-10-08: 09:00:00

## 2014-10-08 MED ORDER — OXYCODONE HCL 5 MG PO TABS
ORAL_TABLET | ORAL | Status: AC
  Filled 2014-10-08: qty 1

## 2014-10-08 MED ORDER — PROTAMINE SULFATE 10 MG/ML IV SOLN
INTRAVENOUS | Status: AC
  Filled 2014-10-08: qty 10

## 2014-10-08 MED ORDER — ESMOLOL HCL 10 MG/ML IV SOLN
INTRAVENOUS | Status: AC
  Filled 2014-10-08: qty 10

## 2014-10-08 MED ORDER — PROPOFOL 10 MG/ML IV BOLUS
INTRAVENOUS | Status: DC | PRN
  Administered 2014-10-08: 150 mg via INTRAVENOUS

## 2014-10-08 MED ORDER — PROPOFOL 10 MG/ML IV BOLUS
INTRAVENOUS | Status: AC
  Filled 2014-10-08: qty 20

## 2014-10-08 MED ORDER — METOPROLOL TARTRATE 1 MG/ML IV SOLN: 2.0000 mg | INTRAVENOUS | Status: DC | PRN

## 2014-10-08 MED ORDER — DARIFENACIN HYDROBROMIDE ER 7.5 MG PO TB24
7.5000 mg | ORAL_TABLET | Freq: Every day | ORAL | Status: DC
  Administered 2014-10-09: 7.5 mg via ORAL
  Filled 2014-10-08: qty 1

## 2014-10-08 MED ORDER — ROCURONIUM BROMIDE 50 MG/5ML IV SOLN
INTRAVENOUS | Status: AC
  Filled 2014-10-08: qty 2

## 2014-10-08 MED ORDER — SODIUM CHLORIDE 0.9 % IJ SOLN
INTRAMUSCULAR | Status: AC
  Filled 2014-10-08: qty 10

## 2014-10-08 MED ORDER — HYDROMORPHONE HCL 1 MG/ML IJ SOLN: 0.2500 mg | INTRAMUSCULAR | Status: DC | PRN

## 2014-10-08 MED ORDER — FLUOXETINE HCL 10 MG PO CAPS
10.0000 mg | ORAL_CAPSULE | Freq: Every day | ORAL | Status: DC
  Administered 2014-10-09 – 2014-10-13 (×5): 10 mg via ORAL
  Filled 2014-10-08 (×5): qty 1

## 2014-10-08 MED ORDER — NITROGLYCERIN 0.4 MG SL SUBL: 0.4000 mg | SUBLINGUAL_TABLET | SUBLINGUAL | Status: DC | PRN

## 2014-10-08 MED ORDER — PROTAMINE SULFATE 10 MG/ML IV SOLN
INTRAVENOUS | Status: DC | PRN
  Administered 2014-10-08 (×2): 50 mg via INTRAVENOUS

## 2014-10-08 MED ORDER — GUAIFENESIN-DM 100-10 MG/5ML PO SYRP: 15.0000 mL | ORAL_SOLUTION | ORAL | Status: DC | PRN

## 2014-10-08 MED ORDER — LIDOCAINE HCL (PF) 1 % IJ SOLN
INTRAMUSCULAR | Status: AC
  Filled 2014-10-08: qty 30

## 2014-10-08 MED ORDER — IPRATROPIUM-ALBUTEROL 0.5-2.5 (3) MG/3ML IN SOLN: 3.0000 mL | Freq: Four times a day (QID) | RESPIRATORY_TRACT | Status: DC | PRN

## 2014-10-08 MED ORDER — BUDESONIDE-FORMOTEROL FUMARATE 160-4.5 MCG/ACT IN AERO
2.0000 | INHALATION_SPRAY | Freq: Two times a day (BID) | RESPIRATORY_TRACT | Status: DC
  Administered 2014-10-08: 2 via RESPIRATORY_TRACT
  Filled 2014-10-08: qty 6

## 2014-10-08 MED ORDER — MIDAZOLAM HCL 5 MG/5ML IJ SOLN
INTRAMUSCULAR | Status: DC | PRN
  Administered 2014-10-08: 2 mg via INTRAVENOUS

## 2014-10-08 MED ORDER — FENTANYL CITRATE 0.05 MG/ML IJ SOLN
INTRAMUSCULAR | Status: DC | PRN
  Administered 2014-10-08: 50 ug via INTRAVENOUS
  Administered 2014-10-08 (×2): 25 ug via INTRAVENOUS
  Administered 2014-10-08: 100 ug via INTRAVENOUS

## 2014-10-08 MED ORDER — BISACODYL 5 MG PO TBEC
5.0000 mg | DELAYED_RELEASE_TABLET | Freq: Every day | ORAL | Status: DC | PRN
  Administered 2014-10-09: 5 mg via ORAL
  Filled 2014-10-08: qty 1

## 2014-10-08 MED ORDER — PHENYLEPHRINE HCL 10 MG/ML IJ SOLN
10.0000 mg | INTRAMUSCULAR | Status: DC | PRN
  Administered 2014-10-08: 10:00:00 via INTRAVENOUS
  Administered 2014-10-08: 40 ug/min via INTRAVENOUS

## 2014-10-08 MED ORDER — GABAPENTIN 300 MG PO CAPS
300.0000 mg | ORAL_CAPSULE | Freq: Every day | ORAL | Status: DC
  Administered 2014-10-08: 300 mg via ORAL
  Filled 2014-10-08 (×2): qty 1

## 2014-10-08 MED ORDER — PHENOL 1.4 % MT LIQD: 1.0000 | OROMUCOSAL | Status: DC | PRN

## 2014-10-08 MED ORDER — SODIUM CHLORIDE 0.9 % IV SOLN: 500.0000 mL | Freq: Once | INTRAVENOUS | Status: AC | PRN

## 2014-10-08 MED ORDER — DOCUSATE SODIUM 100 MG PO CAPS
100.0000 mg | ORAL_CAPSULE | Freq: Every day | ORAL | Status: DC
  Administered 2014-10-09 – 2014-10-13 (×5): 100 mg via ORAL
  Filled 2014-10-08 (×5): qty 1

## 2014-10-08 MED ORDER — DEXTROSE 5 % IV SOLN
1.5000 g | INTRAVENOUS | Status: DC | PRN
  Administered 2014-10-08: 1.5 g via INTRAVENOUS

## 2014-10-08 MED ORDER — EPHEDRINE SULFATE 50 MG/ML IJ SOLN
INTRAMUSCULAR | Status: DC | PRN
  Administered 2014-10-08 (×6): 10 mg via INTRAVENOUS
  Administered 2014-10-08: 5 mg via INTRAVENOUS
  Administered 2014-10-08: 10 mg via INTRAVENOUS
  Administered 2014-10-08: 5 mg via INTRAVENOUS

## 2014-10-08 MED ORDER — ISOSORBIDE MONONITRATE ER 60 MG PO TB24
60.0000 mg | ORAL_TABLET | Freq: Every day | ORAL | Status: DC
  Administered 2014-10-09: 60 mg via ORAL
  Filled 2014-10-08: qty 1

## 2014-10-08 MED ORDER — ROSUVASTATIN CALCIUM 20 MG PO TABS
20.0000 mg | ORAL_TABLET | Freq: Every day | ORAL | Status: DC
  Administered 2014-10-08 – 2014-10-12 (×5): 20 mg via ORAL
  Filled 2014-10-08 (×6): qty 1

## 2014-10-08 MED ORDER — ALUM & MAG HYDROXIDE-SIMETH 200-200-20 MG/5ML PO SUSP: 15.0000 mL | ORAL | Status: DC | PRN

## 2014-10-08 MED ORDER — OXYCODONE HCL 5 MG/5ML PO SOLN: 5.0000 mg | Freq: Once | ORAL | Status: AC | PRN

## 2014-10-08 MED ORDER — PANTOPRAZOLE SODIUM 40 MG PO TBEC
40.0000 mg | DELAYED_RELEASE_TABLET | Freq: Every day | ORAL | Status: DC
  Administered 2014-10-08 – 2014-10-09 (×2): 40 mg via ORAL
  Filled 2014-10-08 (×2): qty 1

## 2014-10-08 MED ORDER — STERILE WATER FOR INJECTION IJ SOLN
INTRAMUSCULAR | Status: AC
  Filled 2014-10-08: qty 20

## 2014-10-08 MED ORDER — CHLORHEXIDINE GLUCONATE CLOTH 2 % EX PADS: 6.0000 | MEDICATED_PAD | Freq: Once | CUTANEOUS | Status: DC

## 2014-10-08 MED ORDER — ONDANSETRON HCL 4 MG/2ML IJ SOLN
INTRAMUSCULAR | Status: DC | PRN
  Administered 2014-10-08: 4 mg via INTRAVENOUS

## 2014-10-08 MED ORDER — LABETALOL HCL 5 MG/ML IV SOLN
INTRAVENOUS | Status: AC
  Filled 2014-10-08: qty 4

## 2014-10-08 MED ORDER — SODIUM CHLORIDE 0.9 % IV SOLN
INTRAVENOUS | Status: DC
  Administered 2014-10-08: 30 mL/h via INTRAVENOUS

## 2014-10-08 MED ORDER — HEPARIN SODIUM (PORCINE) 1000 UNIT/ML IJ SOLN
INTRAMUSCULAR | Status: AC
  Filled 2014-10-08: qty 1

## 2014-10-08 MED ORDER — DOPAMINE-DEXTROSE 3.2-5 MG/ML-% IV SOLN: 0.0000 ug/kg/min | INTRAVENOUS | Status: DC | PRN

## 2014-10-08 MED ORDER — NEOSTIGMINE METHYLSULFATE 10 MG/10ML IV SOLN
INTRAVENOUS | Status: AC
  Filled 2014-10-08: qty 1

## 2014-10-08 MED ORDER — LIDOCAINE HCL (CARDIAC) 20 MG/ML IV SOLN
INTRAVENOUS | Status: AC
  Filled 2014-10-08: qty 5

## 2014-10-08 MED ORDER — TAMSULOSIN HCL 0.4 MG PO CAPS
0.4000 mg | ORAL_CAPSULE | Freq: Every day | ORAL | Status: DC
  Filled 2014-10-08: qty 1

## 2014-10-08 MED ORDER — VECURONIUM BROMIDE 10 MG IV SOLR
INTRAVENOUS | Status: AC
  Filled 2014-10-08: qty 10

## 2014-10-08 MED ORDER — LACTATED RINGERS IV SOLN
INTRAVENOUS | Status: DC | PRN
  Administered 2014-10-08: 08:00:00 via INTRAVENOUS

## 2014-10-08 MED ORDER — 0.9 % SODIUM CHLORIDE (POUR BTL) OPTIME
TOPICAL | Status: DC | PRN
  Administered 2014-10-08: 2000 mL

## 2014-10-08 MED ORDER — ALFUZOSIN HCL ER 10 MG PO TB24
10.0000 mg | ORAL_TABLET | Freq: Every day | ORAL | Status: DC
  Filled 2014-10-08 (×2): qty 1

## 2014-10-08 MED ORDER — ONDANSETRON HCL 4 MG/2ML IJ SOLN
INTRAMUSCULAR | Status: AC
  Filled 2014-10-08: qty 2

## 2014-10-08 MED ORDER — ESMOLOL HCL 10 MG/ML IV SOLN
INTRAVENOUS | Status: DC | PRN
  Administered 2014-10-08: 10 mg via INTRAVENOUS

## 2014-10-08 MED ORDER — LIDOCAINE HCL (CARDIAC) 20 MG/ML IV SOLN
INTRAVENOUS | Status: DC | PRN
  Administered 2014-10-08: 100 mg via INTRATRACHEAL

## 2014-10-08 MED ORDER — ONDANSETRON HCL 4 MG/2ML IJ SOLN: 4.0000 mg | Freq: Four times a day (QID) | INTRAMUSCULAR | Status: DC | PRN

## 2014-10-08 MED ORDER — OXYCODONE HCL 5 MG PO TABS
5.0000 mg | ORAL_TABLET | Freq: Once | ORAL | Status: AC | PRN
  Administered 2014-10-08: 5 mg via ORAL

## 2014-10-08 MED ORDER — SENNOSIDES-DOCUSATE SODIUM 8.6-50 MG PO TABS
1.0000 | ORAL_TABLET | Freq: Every evening | ORAL | Status: DC | PRN
  Filled 2014-10-08: qty 1

## 2014-10-08 MED ORDER — CLOPIDOGREL BISULFATE 75 MG PO TABS
75.0000 mg | ORAL_TABLET | Freq: Every day | ORAL | Status: DC
  Administered 2014-10-09 – 2014-10-13 (×5): 75 mg via ORAL
  Filled 2014-10-08 (×5): qty 1

## 2014-10-08 MED ORDER — HYDRALAZINE HCL 20 MG/ML IJ SOLN: 5.0000 mg | INTRAMUSCULAR | Status: DC | PRN

## 2014-10-08 MED ORDER — OXYCODONE HCL 5 MG PO TABS: 5.0000 mg | ORAL_TABLET | Freq: Four times a day (QID) | ORAL | Status: DC | PRN

## 2014-10-08 MED ORDER — ACETAMINOPHEN 650 MG RE SUPP: 325.0000 mg | RECTAL | Status: DC | PRN

## 2014-10-08 MED ORDER — NEOSTIGMINE METHYLSULFATE 10 MG/10ML IV SOLN
INTRAVENOUS | Status: DC | PRN
  Administered 2014-10-08: 5 mg via INTRAVENOUS

## 2014-10-08 MED ORDER — FUROSEMIDE 40 MG PO TABS
40.0000 mg | ORAL_TABLET | Freq: Every day | ORAL | Status: DC
  Administered 2014-10-09: 40 mg via ORAL
  Filled 2014-10-08: qty 1

## 2014-10-08 MED ORDER — GLYCOPYRROLATE 0.2 MG/ML IJ SOLN
INTRAMUSCULAR | Status: AC
  Filled 2014-10-08: qty 4

## 2014-10-08 MED ORDER — ALBUTEROL SULFATE (2.5 MG/3ML) 0.083% IN NEBU: 2.5000 mg | INHALATION_SOLUTION | RESPIRATORY_TRACT | Status: DC | PRN

## 2014-10-08 MED ORDER — GLYCOPYRROLATE 0.2 MG/ML IJ SOLN
INTRAMUSCULAR | Status: DC | PRN
  Administered 2014-10-08: .8 mg via INTRAVENOUS

## 2014-10-08 MED ORDER — DEXTROSE 5 % IV SOLN
1.5000 g | Freq: Two times a day (BID) | INTRAVENOUS | Status: DC
  Administered 2014-10-08: 1.5 g via INTRAVENOUS
  Filled 2014-10-08 (×3): qty 1.5

## 2014-10-08 MED ORDER — VECURONIUM BROMIDE 10 MG IV SOLR
INTRAVENOUS | Status: DC | PRN
  Administered 2014-10-08: 2 mg via INTRAVENOUS
  Administered 2014-10-08: 1 mg via INTRAVENOUS
  Administered 2014-10-08: 3 mg via INTRAVENOUS

## 2014-10-08 MED ORDER — ALBUMIN HUMAN 5 % IV SOLN
INTRAVENOUS | Status: DC | PRN
  Administered 2014-10-08 (×3): via INTRAVENOUS

## 2014-10-08 MED ORDER — INSULIN ASPART 100 UNIT/ML ~~LOC~~ SOLN
0.0000 [IU] | Freq: Three times a day (TID) | SUBCUTANEOUS | Status: DC
  Administered 2014-10-09: 2 [IU] via SUBCUTANEOUS

## 2014-10-08 MED ORDER — LABETALOL HCL 5 MG/ML IV SOLN: 10.0000 mg | INTRAVENOUS | Status: DC | PRN

## 2014-10-08 MED ORDER — MIDAZOLAM HCL 2 MG/2ML IJ SOLN
INTRAMUSCULAR | Status: AC
  Filled 2014-10-08: qty 2

## 2014-10-08 MED ORDER — FAMOTIDINE 40 MG PO TABS
40.0000 mg | ORAL_TABLET | Freq: Every day | ORAL | Status: DC
  Administered 2014-10-08 – 2014-10-13 (×6): 40 mg via ORAL
  Filled 2014-10-08 (×6): qty 1

## 2014-10-08 MED ORDER — FENTANYL CITRATE 0.05 MG/ML IJ SOLN
INTRAMUSCULAR | Status: AC
  Filled 2014-10-08: qty 5

## 2014-10-08 MED ORDER — OXYCODONE HCL 5 MG PO TABS
5.0000 mg | ORAL_TABLET | ORAL | Status: DC | PRN
  Administered 2014-10-08: 10 mg via ORAL
  Administered 2014-10-08: 5 mg via ORAL
  Filled 2014-10-08: qty 1
  Filled 2014-10-08: qty 2

## 2014-10-08 MED ORDER — CITALOPRAM HYDROBROMIDE 20 MG PO TABS
20.0000 mg | ORAL_TABLET | Freq: Every day | ORAL | Status: DC
  Administered 2014-10-09: 20 mg via ORAL
  Filled 2014-10-08: qty 1

## 2014-10-08 MED ORDER — DILTIAZEM HCL ER COATED BEADS 240 MG PO CP24
240.0000 mg | ORAL_CAPSULE | Freq: Every day | ORAL | Status: DC
  Administered 2014-10-09: 240 mg via ORAL
  Filled 2014-10-08: qty 1

## 2014-10-08 MED ORDER — ROCURONIUM BROMIDE 100 MG/10ML IV SOLN
INTRAVENOUS | Status: DC | PRN
  Administered 2014-10-08: 50 mg via INTRAVENOUS

## 2014-10-08 SURGICAL SUPPLY — 41 items
BENZOIN TINCTURE PRP APPL 2/3 (GAUZE/BANDAGES/DRESSINGS) ×2 IMPLANT
CANISTER SUCTION 2500CC (MISCELLANEOUS) ×2 IMPLANT
CANNULA VESSEL 3MM 2 BLNT TIP (CANNULA) ×2 IMPLANT
CATH ROBINSON RED A/P 18FR (CATHETERS) ×2 IMPLANT
CLIP LIGATING EXTRA MED SLVR (CLIP) ×2 IMPLANT
CLIP LIGATING EXTRA SM BLUE (MISCELLANEOUS) ×2 IMPLANT
CLSR STERI-STRIP ANTIMIC 1/2X4 (GAUZE/BANDAGES/DRESSINGS) ×2 IMPLANT
CRADLE DONUT ADULT HEAD (MISCELLANEOUS) ×2 IMPLANT
DECANTER SPIKE VIAL GLASS SM (MISCELLANEOUS) IMPLANT
DRAIN HEMOVAC 1/8 X 5 (WOUND CARE) IMPLANT
DRSG COVADERM 4X8 (GAUZE/BANDAGES/DRESSINGS) ×2 IMPLANT
ELECT REM PT RETURN 9FT ADLT (ELECTROSURGICAL) ×2
ELECTRODE REM PT RTRN 9FT ADLT (ELECTROSURGICAL) ×1 IMPLANT
EVACUATOR SILICONE 100CC (DRAIN) IMPLANT
GAUZE SPONGE 4X4 12PLY STRL (GAUZE/BANDAGES/DRESSINGS) ×2 IMPLANT
GEL ULTRASOUND 20GR AQUASONIC (MISCELLANEOUS) IMPLANT
GLOVE SS BIOGEL STRL SZ 7.5 (GLOVE) ×1 IMPLANT
GLOVE SUPERSENSE BIOGEL SZ 7.5 (GLOVE) ×1
GOWN STRL REUS W/ TWL LRG LVL3 (GOWN DISPOSABLE) ×3 IMPLANT
GOWN STRL REUS W/TWL LRG LVL3 (GOWN DISPOSABLE) ×3
KIT BASIN OR (CUSTOM PROCEDURE TRAY) ×2 IMPLANT
KIT ROOM TURNOVER OR (KITS) ×2 IMPLANT
NEEDLE 22X1 1/2 (OR ONLY) (NEEDLE) IMPLANT
NS IRRIG 1000ML POUR BTL (IV SOLUTION) ×4 IMPLANT
PACK CAROTID (CUSTOM PROCEDURE TRAY) ×2 IMPLANT
PAD ARMBOARD 7.5X6 YLW CONV (MISCELLANEOUS) ×4 IMPLANT
PATCH HEMASHIELD 8X75 (Vascular Products) ×2 IMPLANT
SHUNT CAROTID BYPASS 10 (VASCULAR PRODUCTS) ×2 IMPLANT
SHUNT CAROTID BYPASS 12FRX15.5 (VASCULAR PRODUCTS) IMPLANT
SPONGE GAUZE 4X4 12PLY STER LF (GAUZE/BANDAGES/DRESSINGS) ×2 IMPLANT
SPONGE INTESTINAL PEANUT (DISPOSABLE) ×2 IMPLANT
STRIP CLOSURE SKIN 1/2X4 (GAUZE/BANDAGES/DRESSINGS) ×2 IMPLANT
SUT ETHILON 3 0 PS 1 (SUTURE) IMPLANT
SUT PROLENE 6 0 CC (SUTURE) ×6 IMPLANT
SUT SILK 3 0 (SUTURE)
SUT SILK 3-0 18XBRD TIE 12 (SUTURE) IMPLANT
SUT VIC AB 3-0 SH 27 (SUTURE) ×2
SUT VIC AB 3-0 SH 27X BRD (SUTURE) ×2 IMPLANT
SUT VICRYL 4-0 PS2 18IN ABS (SUTURE) ×2 IMPLANT
SYR CONTROL 10ML LL (SYRINGE) IMPLANT
WATER STERILE IRR 1000ML POUR (IV SOLUTION) ×2 IMPLANT

## 2014-10-08 NOTE — Progress Notes (Signed)
UR COMPLETED  

## 2014-10-08 NOTE — Anesthesia Postprocedure Evaluation (Signed)
Anesthesia Post Note  Patient: [name]  Procedure(s) Performed: Procedure(s) (LRB): Right ENDARTERECTOMY CAROTID (Right) PATCH ANGIOPLASTY Right Carotid (Right)  Anesthesia type: general  Patient location: PACU  Post pain: Pain level controlled  Post assessment: Patient's Cardiovascular Status Stable  Last Vitals:  Filed Vitals:   10/08/14 1200  BP: 90/42  Pulse: 76  Temp:   Resp: 17    Post vital signs: Reviewed and stable  Level of consciousness: sedated  Complications: No apparent anesthesia complications

## 2014-10-08 NOTE — Anesthesia Procedure Notes (Signed)
Procedure Name: Intubation Date/Time: 10/08/2014 8:52 AM Performed by: Gean Maidens Pre-anesthesia Checklist: Patient identified, Emergency Drugs available, Suction available and Patient being monitored Patient Re-evaluated:Patient Re-evaluated prior to inductionOxygen Delivery Method: Circle system utilized Preoxygenation: Pre-oxygenation with 100% oxygen Intubation Type: IV induction Ventilation: Mask ventilation without difficulty and Oral airway inserted - appropriate to patient size Laryngoscope Size: Sabra Heck and 2 Grade View: Grade I Tube type: Oral Tube size: 7.5 mm Number of attempts: 1 Airway Equipment and Method: Stylet Placement Confirmation: ETT inserted through vocal cords under direct vision,  positive ETCO2 and breath sounds checked- equal and bilateral Secured at: 24 cm Tube secured with: Tape Dental Injury: Teeth and Oropharynx as per pre-operative assessment

## 2014-10-08 NOTE — Transfer of Care (Signed)
Immediate Anesthesia Transfer of Care Note  Patient: [name]  Procedure(s) Performed: Procedure(s): Right ENDARTERECTOMY CAROTID (Right) PATCH ANGIOPLASTY Right Carotid (Right)  Patient Location: PACU  Anesthesia Type:General  Level of Consciousness: awake, alert , patient cooperative and responds to stimulation  Airway & Oxygen Therapy: Patient Spontanous Breathing and Patient connected to face mask oxygen  Post-op Assessment: Report given to RN and Post -op Vital signs reviewed and stable  Post vital signs: Reviewed and stable  Last Vitals:  Filed Vitals:   10/08/14 1055  BP:   Pulse: 89  Temp: 36.7 C  Resp: 22    Complications: No apparent anesthesia complications

## 2014-10-08 NOTE — Progress Notes (Signed)
Patient ID: [name], male   DOB: Nov 22, 1955, 59 y.o.   MRN: [id number] [name]  10/03/2014 4:00 PM  Office Visit  MRN:  [id number]   Description: Male DOB: 10/05/1955  Provider: Elam Dutch, MD  Department: [facility]       Diagnoses     Carotid stenosis, bilateral - Primary    ICD-9-CM: 433.10, 433.30 ICD-10-CM: I65.23       Reason for Visit     New Evaluation    carotid  lab study prior    Reason for Visit History        Current Vitals  Most recent update: 10/03/2014 4:20 PM by Madalyn Rob, LPN    BP Pulse Resp Ht Wt BMI    98/58 mmHg 73 20 6' (1.829 m) 240 lb (108.863 kg) 32.54 kg/m2     Vitals History     Progress Notes      Elam Dutch, MD at 10/03/2014 4:59 PM     Status: Signed       Expand All Collapse All   [facility] [place] HISTORY AND PHYSICAL   History of Present Illness: Patient is a 59 y.o. year old male who presents for evaluation of possible symptomatic right internal carotid artery stenosis. The patient has been having symptoms of dizziness. These primarily occur when: From sitting to standing position. These can also occur with coughing or with bending over. These amend going on for 1 month. He also has had several episodes of "passing out." However, his family states that he never is completely unconscious his body just collapses. These last for several minutes and then he recovers. He is a current smoker of 1 pack of cigarettes per day. He has no intentions of quitting. He is on Plavix. He has had one prior stroke. He states he never had any symptoms from this but they saw this on a CT scan. Other medical problems include coronary artery disease, COPD, hyperlipidemia, diabetes, substance abuse, sleep apnea, home O2 use, pulmonary fibrosis.  Past Medical History  Diagnosis Date  . Essential hypertension, benign   . TIA (transient ischemic attack) 1997    No  residual neurological deficits.  . Noncompliance   . COPD (chronic obstructive pulmonary disease)   . Hyperlipidemia   . History of pneumonia   . Type 2 diabetes mellitus 2011  . GERD (gastroesophageal reflux disease)   . Substance abuse   . Cocaine abuse     02-14-14 discusses freely with medical team, prefers no discussions with family present.  . Renal cell carcinoma     Status post radical nephrectomy August 2015  . Cholecystitis, acute 12/20/2013    Lap chole 6/5  . Coronary atherosclerosis of native coronary artery     a. 03/09/2013 Cath/PCI: LM nl, LAD: 50p, 54m (2.5x16 promus DES), LCX nl, OM1 min irregs, LPL/LPDA diff dzs, RCA nondom, mod diff dzs, EF 55%.  . Depression   . Anxiety   . History of nephrolithiasis   . Arthritis   . Hepatitis Late 1970s  . Sleep apnea     On CPAP, 4L O2    Past Surgical History  Procedure Laterality Date  . Appendectomy  1970's  . Cholecystectomy N/A 12/22/2013    Procedure: LAPAROSCOPIC CHOLECYSTECTOMY ; Surgeon: [name], MD; Location: [facility]; Service: General; Laterality: N/A;  . Robot assisted laparoscopic nephrectomy Right 02/21/2014    Procedure: ROBOTIC ASSISTED LAPAROSCOPIC RIGHT NEPHRECTOMY ; Surgeon: Hubbard Robinson  Tresa Moore, MD; Location: [facility]; Service: Urology; Laterality: Right;  . Laparoscopic lysis of adhesions  02/21/2014    Procedure: LAPAROSCOPIC LYSIS OF ADHESIONS EXTINSIVE; Surgeon: [name], MD; Location: [facility]; Service: Urology;;  . Video assisted thoracoscopy Left 05/18/2014    Procedure: LEFT VIDEO ASSISTED THORACOSCOPY; Surgeon: [name], MD; Location: [name]; Service: Thoracic; Laterality: Left;  . Lung biopsy Left 05/18/2014    Procedure: LUNG BIOPSY left upper lobe & left lower lobe; Surgeon: [name], MD; Location: [facility]; Service: Thoracic; Laterality: Left;  .  Video bronchoscopy  05/18/2014    Procedure: VIDEO BRONCHOSCOPY; Surgeon: [name], MD; Location: [facility]; Service: Thoracic;;  . Left heart catheterization with coronary angiogram N/A 06/02/2012    Procedure: LEFT HEART CATHETERIZATION WITH CORONARY ANGIOGRAM; Surgeon: [name], MD; Location: [facility] CATH LAB; Service: Cardiovascular; Laterality: N/A;  . Left heart catheterization with coronary angiogram N/A 03/09/2013    Procedure: LEFT HEART CATHETERIZATION WITH CORONARY ANGIOGRAM; Surgeon: [name], MD; Location: [facility] CATH LAB; Service: Cardiovascular; Laterality: N/A;  . Left heart catheterization with coronary angiogram N/A 12/20/2013    Procedure: LEFT HEART CATHETERIZATION WITH CORONARY ANGIOGRAM; Surgeon: [name], MD; Location: [facility] CATH LAB; Service: Cardiovascular; Laterality: N/A;    Social History History  Substance Use Topics  . Smoking status: Current Every Day Smoker -- 0.50 packs/day for 41 years    Types: Cigarettes    Start date: 05/03/1972  . Smokeless tobacco: Never Used     Comment: smokes 1/2 pack daily  . Alcohol Use: No    Family History Family History  Problem Relation Age of Onset  . Hypertension    . Diabetes    . Stroke    . Lung cancer Father   . Cancer Mother     Thyroid - living in her 21's.  . Cancer Maternal Grandmother     Breast  . Cancer Maternal Grandfather     Throat and stomach  . CAD Father   . CAD Brother     Allergies  No Known Allergies   Current Outpatient Prescriptions  Medication Sig Dispense Refill  . albuterol (PROVENTIL HFA;VENTOLIN HFA) 108 (90 BASE) MCG/ACT inhaler Inhale 2 puffs into the lungs every 6 (six) hours as needed for wheezing or shortness of breath.    [name] albuterol (PROVENTIL) (2.5 MG/3ML) 0.083% nebulizer solution Take 3 mLs (2.5 mg total) by nebulization  every 3 (three) hours as needed for wheezing or shortness of breath. 75 mL 12  . alfuzosin (UROXATRAL) 10 MG 24 hr tablet Take 10 mg by mouth daily with breakfast.    . budesonide-formoterol (SYMBICORT) 160-4.5 MCG/ACT inhaler Inhale 2 puffs into the lungs 2 (two) times daily. 1 Inhaler 3  . citalopram (CELEXA) 20 MG tablet Take 20 mg by mouth daily.    . clopidogrel (PLAVIX) 75 MG tablet Take 1 tablet (75 mg total) by mouth daily. 30 tablet 11  . diltiazem (CARDIZEM CD) 240 MG 24 hr capsule Take 240 mg by mouth every morning.    . Fish Oil-Cholecalciferol (FISH OIL + D3 PO) Take 1 capsule by mouth 4 (four) times daily.     [name] FLUoxetine (PROZAC) 10 MG capsule Take 10 mg by mouth every morning.    . furosemide (LASIX) 40 MG tablet Take 1 tablet (40 mg total) by mouth daily. 30 tablet 0  . gabapentin (NEURONTIN) 300 MG capsule Take 300 mg by mouth at bedtime.    [name] glipiZIDE (GLUCOTROL) 10  MG tablet Take 10 mg by mouth 2 (two) times daily.    [name] ipratropium-albuterol (DUONEB) 0.5-2.5 (3) MG/3ML SOLN Take 3 mLs by nebulization every 6 (six) hours as needed (Shortness of breath).    . isosorbide mononitrate (IMDUR) 60 MG 24 hr tablet Take 60 mg by mouth every morning.     . nitroGLYCERIN (NITROSTAT) 0.4 MG SL tablet Place 1 tablet (0.4 mg total) under the tongue every 5 (five) minutes as needed for chest pain. 30 tablet 1  . ranitidine (ZANTAC) 300 MG tablet Take 300 mg by mouth every morning.     . rosuvastatin (CRESTOR) 20 MG tablet Take 20 mg by mouth daily.    . solifenacin (VESICARE) 5 MG tablet Take 5 mg by mouth daily.    . tamsulosin (FLOMAX) 0.4 MG CAPS capsule Take 1 capsule (0.4 mg total) by mouth daily after supper. 30 capsule 1   No current facility-administered medications for this visit.    ROS:   General: No weight loss, Fever, chills  HEENT: No recent headaches, no nasal bleeding, no  visual changes, no sore throat  Neurologic: + dizziness, no blackouts, no seizures. No recent symptoms of stroke or mini- stroke. No recent episodes of slurred speech, or temporary blindness.  Cardiac: No recent episodes of chest pain/pressure, no shortness of breath at rest. + shortness of breath with exertion. Denies history of atrial fibrillation or irregular heartbeat  Vascular: No history of rest pain in feet. No history of claudication. No history of non-healing ulcer, No history of DVT   Pulmonary: + home oxygen, no productive cough, no hemoptysis, No asthma or wheezing  Musculoskeletal: [ ]  Arthritis, [ ]  Low back pain, [ ]  Joint pain  Hematologic:No history of hypercoagulable state. No history of easy bleeding. No history of anemia  Gastrointestinal: No hematochezia or melena, No gastroesophageal reflux, no trouble swallowing  Urinary: [ ]  chronic Kidney disease, [ ]  on HD - [ ]  MWF or [ ]  TTHS, [ ]  Burning with urination, [ ]  Frequent urination, [ ]  Difficulty urinating;   Skin: No rashes  Psychological: + history of anxiety, + history of depression   Physical Examination  Filed Vitals:   10/03/14 1619 10/03/14 1620  BP: 104/63 98/58  Pulse: 72 73  Resp: 20   Height: 6' (1.829 m)   Weight: 240 lb (108.863 kg)     Body mass index is 32.54 kg/(m^2).  General: Alert and oriented, no acute distress HEENT: Normal Neck: Bilateral soft carotid bruits Pulmonary: Clear to auscultation bilaterally Cardiac: Regular Rate and Rhythm without murmur Abdomen: Soft, non-tender, non-distended, no mass Skin: No rash Extremity Pulses: 2+ radial, brachial, femoral, dorsalis pedis pulses bilaterally Musculoskeletal: No deformity or edema Neurologic: Upper and lower extremity motor 5/5 and symmetric  DATA: A duplex scan was performed in our office today which shows greater than 80% right internal carotid artery stenosis, occluded left  internal carotid artery velocities on the right side 311/122. Brachial artery pressures were equal. There was antegrade vertebral flow bilaterally. I reviewed and interpreted this study.   ASSESSMENT: Left internal carotid artery occlusion with high-grade right internal carotid artery stenosis. Possible symptomatic with drop attacks due to cerebral global hypoperfusion.   PLAN: Right carotid endarterectomy for stroke prophylaxis by my partner Dr. Donnetta Hutching on March 21. Risks benefits possible complications and procedure details including but not limited to bleeding infection cranial nerve injury stroke risk myocardial event were explained the patient today. He understands and agrees to proceed.  Ruta Hinds, MD [facility] [place] Office: [phone] Pager: [phone]               Psych Notes     No notes of this type exist for this encounter.     [facility] Patient Outreach     No notes of this type exist for this encounter.     Not recorded        Referring Provider     Betty G [name], MD     Level of Service     PR OFFICE OUTPATIENT NEW 60 MINUTES [phone]         All Flowsheet Templates (all recorded)     Amb Nursing Assessment    Anthropometrics    Custom Formula Data    Encounter [place] Directives    Infectious Disease Screening      Referring Provider     Betty G [name], MD     All Charges for This Encounter     Code Description Service Date Service Provider Modifiers Qty    [phone] PR OFFICE OUTPATIENT NEW 60 MINUTES 10/03/2014 Elam Dutch, MD  1      Routing History     There are no sent or routed communications associated with this encounter.     AVS Reports     Date/Time Report Action User    10/03/2014 4:44 PM After Visit Summary Printed Berniece Salines      Smoking Cessation Audit Trail       Ready to Quit Counseling Given User Date and Time     Office Visit - 10/03/2014 No Not [place], MD 10/03/2014 5:59 PM    Office Visit - 09/14/2014 No Yes Melvenia Needles, NP 09/14/2014 2:28 PM    Office Visit - 06/05/2014 Yes Yes [name], MD 06/05/2014 4:23 PM    Surgical Consult - 05/15/2014 No Yes [name], MD 05/15/2014 3:25 PM    Office Visit - 05/03/2014 No No Orion Modest, CMA 05/03/2014 11:31 AM    Hospital Encounter - 03/08/2013 No Yes Levin Bacon, RN 03/09/2013 10:27 PM    Hospital Encounter - 06/01/2012 No Yes Marijean Bravo, Med Student 06/01/2012 4:07 PM      Diabetic Foot Exam    No data filed     Diabetic Foot Form - Detailed    No data filed     Diabetic Foot Exam - Simple    No data filed     Guarantor Account: Natan, [place] ([id number])     Relation to Patient: Account Type Service Area    Self Personal/Family [place] for This Account     Coverage ID [place] ID    [id number] MEDICAID [place] [id number] P        Guarantor Account: Alexandra, Posadas ([id number])     Relation to Patient: Account Type Service Area    Self Personal/Family [facility]          Guarantor Account: Crue, Otero ([id number])     Relation to Patient: Account Type Service Area    Self Personal/Family [facility]          Guarantor Account: Sun, Wilensky ([id number])     Relation to Patient: Account Type Service Area    Self Personal/Family [place]  Coverages for This Account     Coverage ID Payor Plan Insurance ID    [id number] MEDICAID [place] MEDICAID OF [place] [id number] P          Addendum:  The patient has been re-examined and re-evaluated.  The patient's history and physical has been reviewed and is unchanged.    SEBASTIAN LURZ is a 59 y.o. male is being admitted with Right carotid  artery stenosis I65.21. All the risks, benefits and other treatment options have been discussed with the patient. The patient has consented to proceed with Procedure(s): ENDARTERECTOMY CAROTID as a surgical intervention.  Mansi Tokar 10/08/2014 7:02 AM [facility]

## 2014-10-08 NOTE — Anesthesia Preprocedure Evaluation (Addendum)
Anesthesia Evaluation  Patient identified by MRN, date of birth, ID band Patient awake    Reviewed: NPO status , Patient's Chart, lab work & pertinent test results, reviewed documented beta blocker date and time   History of Anesthesia Complications Negative for: history of anesthetic complications  Airway Mallampati: I  TM Distance: >3 FB Neck ROM: Full    Dental  (+) Edentulous Lower, Edentulous Upper   Pulmonary sleep apnea and Continuous Positive Airway Pressure Ventilation , COPDCurrent Smoker,    Pulmonary exam normal       Cardiovascular hypertension, + angina + CAD  03/09/2013 Cath/PCI: LM nl, LAD: 50p, 34m (2.5x16 promus DES), LCX nl, OM1 min irregs, LPL/LPDA diff dzs, RCA nondom, mod diff dzs, EF 55%. - Left ventricle: The cavity size was normal. Wall thickness was increased in a pattern of mild LVH. Systolic function wasvigorous. The estimated ejection fraction was in the range of 65%to 70%. Wall motion was normal; there were no regional wallmotion abnormalities.   Neuro/Psych PSYCHIATRIC DISORDERS Anxiety Depression CVA, Residual Symptoms    GI/Hepatic GERD-  ,(+) Hepatitis -  Endo/Other  diabetes  Renal/GU Renal InsufficiencyRenal diseaseRenal cell ca     Musculoskeletal   Abdominal   Peds  Hematology   Anesthesia Other Findings   Reproductive/Obstetrics                           Anesthesia Physical Anesthesia Plan  ASA: III  Anesthesia Plan: General   Post-op Pain Management:    Induction: Intravenous  Airway Management Planned: Oral ETT  Additional Equipment: Arterial line  Intra-op Plan:   Post-operative Plan: Extubation in OR  Informed Consent: I have reviewed the patients History and Physical, chart, labs and discussed the procedure including the risks, benefits and alternatives for the proposed anesthesia with the patient or authorized representative who has  indicated his/her understanding and acceptance.   Dental advisory given  Plan Discussed with: Anesthesiologist, Surgeon and CRNA  Anesthesia Plan Comments:        Anesthesia Quick Evaluation

## 2014-10-08 NOTE — Progress Notes (Addendum)
  Day of Surgery Note    Subjective:  Sleepy awakes to voice  Filed Vitals:   10/08/14 1332  BP:   Pulse: 74  Temp:   Resp: 20    Incisions:   Covered with minimal blood on bandage Extremities:  5/5 RUE/RLE 4/5 LUE/LLE Lungs:  Non labored Neuro:  Slightly weaker on the left side, otherwise intact; tongue midline  Assessment/Plan:  This is a 59 y.o. male who is s/p right carotid endarterectomy  -pt is neurologically intact with the left side being slightly weaker.  Dr. Donnetta Hutching evaluated pt and will continue to observe. -pt to [facility] when bed available   Leontine Locket, PA-C 10/08/2014 1:36 PM

## 2014-10-08 NOTE — Op Note (Signed)
     Patient name: [name] MRN: [id number] DOB: July 18, 1956 Sex: male  10/08/2014 Pre-operative Diagnosis: Asymptomatic right carotid stenosis Post-operative diagnosis:  Same Surgeon:  [name], M.D. Assistants:  Rhyne Procedure:    right carotid Endarterectomy with Dacron patch angioplasty Anesthesia:  General Blood Loss:  See anesthesia record   Indications for surgery:  Drop attacks with known left internal carotid occlusion and high-grade right carotid stenosis.  Procedure in detail:  The patient was taken to the operating and placed in the supine position. The neck was prepped and draped in the usual sterile fashion. An incision was made anterior to the sternocleidomastoid muscle and continued with electrocautery through the platysma muscle. The muscle was retracted posteriorly and the carotid sheath was opened. The facial vein was ligated with 2-0 silk ties and divided. The common carotid artery was encircled with an umbilical tape and Rummel tourniquet. Dissection was continued onto the carotid bifurcation. The superior thyroid artery was controlled with a 2-0 silk Potts tie. The external carotid organ was encircled with a vessel loop and the internal carotid was encircled with umbilical tape and Rummel tourniquet. The hypoglossal and vagus nerves were identified and preserved.  The patient was given systemic heparinization. After adequate circulation time, the internal,external and common carotid arteries were occluded. The common carotid was opened with an 11 blade and the arteriotomy was continued with Potts scissors onto the internal carotid artery. A 10 shunt was passed up the internal carotid artery, allowed to back bleed, and then passed down the common carotid artery. The shunt was secured with Rummel tourniquet. The endarterectomy was begun on the common carotid artery  plaque was divided proximally with Potts scissors. The endarterectomy was continued onto the carotid  bifurcation. The external carotid was endarterectomized by eversion technique and the internal carotid artery was endarterectomized in an open fashion. Remaining debris was removed from the endarterectomy plane. A Dacron patch was brought to the field and sewn as a patch angioplasty. Prior to completing the anastomosis, the shunt was removed and the usual flushing maneuvers were undertaken. The anastomosis was then completed and flow was restored first to the external and then the internal carotid artery. Excellent flow characteristics were noted with hand-held Doppler in the internal and external carotid arteries.  The patient was given protamine to reverse the heparin. Hemostasis was obtained with electrocautery. The wounds were irrigated with saline. The wound was closed by first reapproximating the sternocleidomastoid muscle over the carotid artery with interrupted 3-0 Vicryl sutures. Next, the platysma was closed with a running 3-0 Vicryl suture. The skin was closed with a 4-0 subcuticular Vicryl suture. Benzoin and Steri-Strips were applied to the incision. A sterile dressing was placed over the incision. All sponge and needle counts were correct. The patient was awakened in the operating room, neurologically intact. They were transferred to the PACU in stable condition.  Carotid stenosis at surgery: 80%  Disposition:  To PACU in stable condition,neurologically intact  Relevant Operative Details:  Contralateral occlusion  [name], M.D. [facility] Office: [phone] Pager:  [phone]

## 2014-10-08 NOTE — Telephone Encounter (Addendum)
-----   Message from Gabriel Earing, [place] sent at 10/08/2014 10:47 AM EDT ----- S/p right CEA 10/08/14.  F/u with Dr. Donnetta Hutching in 2 weeks.  Thanks, Aldona Bar   10/08/14: lm for pt to call Hinton Dyer at [phone]- did not leave any other information due to his restrictions on account, dpm

## 2014-10-09 ENCOUNTER — Inpatient Hospital Stay (HOSPITAL_COMMUNITY): Payer: Medicaid Other

## 2014-10-09 ENCOUNTER — Encounter (HOSPITAL_COMMUNITY): Payer: Self-pay | Admitting: Vascular Surgery

## 2014-10-09 ENCOUNTER — Encounter: Payer: Medicaid Other | Admitting: Vascular Surgery

## 2014-10-09 DIAGNOSIS — J9601 Acute respiratory failure with hypoxia: Secondary | ICD-10-CM

## 2014-10-09 LAB — GLUCOSE, CAPILLARY
GLUCOSE-CAPILLARY: 102 mg/dL — AB (ref 70–99)
Glucose-Capillary: 112 mg/dL — ABNORMAL HIGH (ref 70–99)
Glucose-Capillary: 121 mg/dL — ABNORMAL HIGH (ref 70–99)
Glucose-Capillary: 122 mg/dL — ABNORMAL HIGH (ref 70–99)
Glucose-Capillary: 142 mg/dL — ABNORMAL HIGH (ref 70–99)

## 2014-10-09 LAB — BLOOD GAS, ARTERIAL
ACID-BASE DEFICIT: 6.5 mmol/L — AB (ref 0.0–2.0)
Acid-base deficit: 4.6 mmol/L — ABNORMAL HIGH (ref 0.0–2.0)
BICARBONATE: 20.1 meq/L (ref 20.0–24.0)
BICARBONATE: 21.5 meq/L (ref 20.0–24.0)
DRAWN BY: 227661
Delivery systems: POSITIVE
Drawn by: 22563
Expiratory PAP: 6
FIO2: 0.5 %
INSPIRATORY PAP: 12
O2 CONTENT: 15 L/min
O2 Saturation: 93.7 %
O2 Saturation: 92 %
PH ART: 7.25 — AB (ref 7.350–7.450)
PO2 ART: 81.9 mmHg (ref 80.0–100.0)
PO2 ART: 70.4 mmHg — AB (ref 80.0–100.0)
Patient temperature: 98.6
Patient temperature: 98.6
TCO2: 21.7 mmol/L (ref 0–100)
TCO2: 23.1 mmol/L (ref 0–100)
pCO2 arterial: 52.3 mmHg — ABNORMAL HIGH (ref 35.0–45.0)
pCO2 arterial: 50.8 mmHg — ABNORMAL HIGH (ref 35.0–45.0)
pH, Arterial: 7.21 — ABNORMAL LOW (ref 7.350–7.450)

## 2014-10-09 LAB — TROPONIN I
Troponin I: <0.03 ng/mL (ref <0.031)
Troponin I: <0.03 ng/mL (ref <0.031)
Troponin I: 0.03 ng/mL (ref <0.031)

## 2014-10-09 LAB — BASIC METABOLIC PANEL WITH GFR
Anion gap: 8 (ref 5–15)
BUN: 40 mg/dL — ABNORMAL HIGH (ref 6–23)
CO2: 22 mmol/L (ref 19–32)
Calcium: 8.3 mg/dL — ABNORMAL LOW (ref 8.4–10.5)
Chloride: 101 mmol/L (ref 96–112)
Creatinine, Ser: 3.16 mg/dL — ABNORMAL HIGH (ref 0.50–1.35)
GFR calc Af Amer: 23 mL/min — ABNORMAL LOW (ref >90)
GFR calc non Af Amer: 20 mL/min — ABNORMAL LOW (ref >90)
Glucose, Bld: 128 mg/dL — ABNORMAL HIGH (ref 70–99)
Potassium: 5.4 mmol/L — ABNORMAL HIGH (ref 3.5–5.1)
Sodium: 131 mmol/L — ABNORMAL LOW (ref 135–145)

## 2014-10-09 LAB — CBC
HEMATOCRIT: 34.7 % — AB (ref 39.0–52.0)
HEMOGLOBIN: 11 g/dL — AB (ref 13.0–17.0)
MCH: 28.1 pg (ref 26.0–34.0)
MCHC: 31.7 g/dL (ref 30.0–36.0)
MCV: 88.5 fL (ref 78.0–100.0)
PLATELETS: 186 10*3/uL (ref 150–400)
RBC: 3.92 MIL/uL — ABNORMAL LOW (ref 4.22–5.81)
RDW: 15.8 % — AB (ref 11.5–15.5)
WBC: 12.2 10*3/uL — ABNORMAL HIGH (ref 4.0–10.5)

## 2014-10-09 MED ORDER — TAMSULOSIN HCL 0.4 MG PO CAPS
0.8000 mg | ORAL_CAPSULE | Freq: Every day | ORAL | Status: DC
  Administered 2014-10-09 – 2014-10-12 (×4): 0.8 mg via ORAL
  Filled 2014-10-09 (×5): qty 2

## 2014-10-09 MED ORDER — VANCOMYCIN HCL 10 G IV SOLR
2500.0000 mg | Freq: Once | INTRAVENOUS | Status: AC
  Administered 2014-10-09: 2500 mg via INTRAVENOUS
  Filled 2014-10-09: qty 2500

## 2014-10-09 MED ORDER — SODIUM CHLORIDE 0.9 % IV SOLN
1250.0000 mg | INTRAVENOUS | Status: DC
  Administered 2014-10-10: 1250 mg via INTRAVENOUS
  Filled 2014-10-09 (×3): qty 1250

## 2014-10-09 MED ORDER — IPRATROPIUM-ALBUTEROL 0.5-2.5 (3) MG/3ML IN SOLN
3.0000 mL | Freq: Four times a day (QID) | RESPIRATORY_TRACT | Status: DC
  Administered 2014-10-09 – 2014-10-12 (×13): 3 mL via RESPIRATORY_TRACT
  Filled 2014-10-09 (×13): qty 3

## 2014-10-09 MED ORDER — FENTANYL CITRATE 0.05 MG/ML IJ SOLN
25.0000 ug | INTRAMUSCULAR | Status: DC | PRN
Start: 2014-10-09 — End: 2014-10-13
  Administered 2014-10-11 – 2014-10-13 (×4): 25 ug via INTRAVENOUS
  Filled 2014-10-09 (×4): qty 2

## 2014-10-09 MED ORDER — CHLORHEXIDINE GLUCONATE 0.12 % MT SOLN
15.0000 mL | Freq: Two times a day (BID) | OROMUCOSAL | Status: DC
  Administered 2014-10-09: 15 mL via OROMUCOSAL
  Filled 2014-10-09: qty 15

## 2014-10-09 MED ORDER — PIPERACILLIN-TAZOBACTAM 3.375 G IVPB
3.3750 g | Freq: Three times a day (TID) | INTRAVENOUS | Status: DC
  Administered 2014-10-09 – 2014-10-11 (×7): 3.375 g via INTRAVENOUS
  Filled 2014-10-09 (×8): qty 50

## 2014-10-09 MED ORDER — PANTOPRAZOLE SODIUM 40 MG IV SOLR
40.0000 mg | INTRAVENOUS | Status: DC
  Administered 2014-10-10 (×2): 40 mg via INTRAVENOUS
  Filled 2014-10-09: qty 40

## 2014-10-09 MED ORDER — SODIUM CHLORIDE 0.9 % IV SOLN
INTRAVENOUS | Status: DC
Start: 2014-10-09 — End: 2014-10-13
  Administered 2014-10-09: 12:00:00 via INTRAVENOUS

## 2014-10-09 MED ORDER — INSULIN ASPART 100 UNIT/ML ~~LOC~~ SOLN
0.0000 [IU] | SUBCUTANEOUS | Status: DC
  Administered 2014-10-10 (×2): 1 [IU] via SUBCUTANEOUS

## 2014-10-09 MED ORDER — FUROSEMIDE 10 MG/ML IJ SOLN
20.0000 mg | Freq: Once | INTRAMUSCULAR | Status: AC
  Administered 2014-10-09: 20 mg via INTRAVENOUS
  Filled 2014-10-09: qty 2

## 2014-10-09 MED ORDER — CETYLPYRIDINIUM CHLORIDE 0.05 % MT LIQD
7.0000 mL | Freq: Two times a day (BID) | OROMUCOSAL | Status: DC
  Administered 2014-10-09 – 2014-10-10 (×3): 7 mL via OROMUCOSAL

## 2014-10-09 NOTE — Progress Notes (Signed)
UR Completed.  [phone] 10/09/2014

## 2014-10-09 NOTE — Progress Notes (Addendum)
  Progress Note    10/09/2014 7:29 AM 1 Day Post-Op  Subjective:  Feeling rough due to breathing; needs to go to the bathroom  Afebrile 61'B-379'K systolic via cuff-not correlating with a-line. HR  70's-80's regular 91% NRB  Filed Vitals:   10/09/14 0400  BP: 106/62  Pulse: 81  Temp:   Resp: 15     Physical Exam: Neuro:  In tact; bilateral lower extremities 5/5; LUE 4/5 Incision:  Steri strips in tact; mild fullness  CBC    Component Value Date/Time   WBC 12.2* 10/09/2014 0500   RBC 3.92* 10/09/2014 0500   RBC 4.29 12/29/2013 1329   HGB 11.0* 10/09/2014 0500   HCT 34.7* 10/09/2014 0500   PLT 186 10/09/2014 0500   MCV 88.5 10/09/2014 0500   MCH 28.1 10/09/2014 0500   MCHC 31.7 10/09/2014 0500   RDW 15.8* 10/09/2014 0500   LYMPHSABS 0.8 02/25/2014 0354   MONOABS 0.8 02/25/2014 0354   EOSABS 0.0 02/25/2014 0354   BASOSABS 0.0 02/25/2014 0354    BMET    Component Value Date/Time   NA 131* 10/09/2014 0500   K 5.4* 10/09/2014 0500   CL 101 10/09/2014 0500   CO2 22 10/09/2014 0500   GLUCOSE 128* 10/09/2014 0500   BUN 40* 10/09/2014 0500   CREATININE 3.16* 10/09/2014 0500   CREATININE 2.67* 09/26/2014 1138   CALCIUM 8.3* 10/09/2014 0500   GFRNONAA 20* 10/09/2014 0500   GFRAA 23* 10/09/2014 0500     Intake/Output Summary (Last 24 hours) at 10/09/14 0729 Last data filed at 10/09/14 0500  Gross per 24 hour  Intake   2381 ml  Output   1500 ml  Net    881 ml      Assessment/Plan:  This is a 59 y.o. male who is s/p right CEA 1 Day Post-Op  -pt on NRB this am-creatinine is up to 3.16 from 2.25 yesterday.  D/c IVF-will give gentle dose of lasix this morning as creatinine/K are up this am.  will get ABG -Uroxatral discontinued this am and Glucotrol discontinued yesterday due to renal function -foley inserted last pm for urinary retention.  Pt is on Flomax-will increase this to 0.8mg  daily   Leontine Locket, PA-C [facility] [phone]  I have examined the patient, reviewed and agree with above. Drowsy but awakens easily. Worsening of baseline renal insufficiency. Had blood pressure in the 80s to 90s during surgery 9 days postop. Hemodynamically stable at currently. Foley placed due to urinary retention. Will ask renal to see for optimization of renal function  Jadien Lehigh, MD 10/09/2014 8:13 AM

## 2014-10-09 NOTE — Clinical Documentation Improvement (Signed)
Supporting Information:   Acute kidney injury most likely due to ischemic ATN with remarkable drop in blood pressure and patient now with worsening respiratory failure . Will check urine micro and ultrasound of kidneys  Hypotension fluid and pressor support appreciate [facility] Consider sepsis per 3/22 progress notes.     Possible Clinical Condition: . Bacteremia (positive blood cultures only) . Sepsis with UTI, versus UTI only . Sepsis-specify causative organism if known . Sepsis due to: --Device --Implant --Graft --Infusion . Severe sepsis-sepsis with organ dysfunction --Specify organ dysfunction Respiratory failure Encephalopathy Acute kidney failure Other (specify) . SIRS (Systemic Inflammatory Response Syndrome --With or without organ dysfunction . Document septic shock if present . Document any associated diagnoses/conditions     Thank You,

## 2014-10-09 NOTE — Consult Note (Signed)
PULMONARY / CRITICAL CARE MEDICINE   Name: [name] MRN: [id number] DOB: September 25, 1955    ADMISSION DATE:  10/08/2014 CONSULTATION DATE:  10/09/2014  REFERRING MD : Early  CHIEF COMPLAINT:  Respiratory distress  INITIAL PRESENTATION:  59 yo male smoker admitted for Rt carotid endarterectomy >> done 3/21.  Developed respiratory distress and acidosis 3/22 and PCCM consulted.  He is followed by Dr. Chase Caller for ILD.  STUDIES:   SIGNIFICANT EVENTS: 3/21 Rt carotid endarterectomy 3/22 To ICU   HISTORY OF PRESENT ILLNESS:   59 yo male smoker had neck surgery 3/21.  He developed respiratory distress and acidosis 3/22.  He was started on BiPAP and transfer to ICU.  He c/o chest discomfort and chest congestion.  He also has acute worsening of renal fx.  He had foley placed and nephrology was consulted.  PAST MEDICAL HISTORY :   has a past medical history of Essential hypertension, benign; TIA (transient ischemic attack) (1997); Noncompliance; COPD (chronic obstructive pulmonary disease); Hyperlipidemia; History of pneumonia; Type 2 diabetes mellitus (2011); GERD (gastroesophageal reflux disease); Substance abuse; Cocaine abuse; Cholecystitis, acute (12/20/2013); Coronary atherosclerosis of native coronary artery; Depression; Anxiety; History of nephrolithiasis; Arthritis; Hepatitis (Late 1970s); Anginal pain; Stroke; Sleep apnea; Shortness of breath dyspnea; Pneumonia; Renal cell carcinoma; and Headache.  has past surgical history that includes Appendectomy (1970's); Cholecystectomy (N/A, 12/22/2013); Robot assisted laparoscopic nephrectomy (Right, 02/21/2014); Laparoscopic lysis of adhesions (02/21/2014); Video assisted thoracoscopy (Left, 05/18/2014); Lung biopsy (Left, 05/18/2014); Video bronchoscopy (05/18/2014); left heart catheterization with coronary angiogram (N/A, 06/02/2012); left heart catheterization with coronary angiogram (N/A, 03/09/2013); left heart catheterization with coronary angiogram  (N/A, 12/20/2013); and Cardiac catheterization. Prior to Admission medications   Medication Sig Start Date End Date Taking? Authorizing Provider  albuterol (PROVENTIL HFA;VENTOLIN HFA) 108 (90 BASE) MCG/ACT inhaler Inhale 2 puffs into the lungs every 6 (six) hours as needed for wheezing or shortness of breath.   Yes Historical Provider, MD  albuterol (PROVENTIL) (2.5 MG/3ML) 0.083% nebulizer solution Take 3 mLs (2.5 mg total) by nebulization every 3 (three) hours as needed for wheezing or shortness of breath. 02/27/14  Yes Nita Sells, MD  alfuzosin (UROXATRAL) 10 MG 24 hr tablet Take 10 mg by mouth daily with breakfast.   Yes Historical Provider, MD  budesonide-formoterol (SYMBICORT) 160-4.5 MCG/ACT inhaler Inhale 2 puffs into the lungs 2 (two) times daily. 05/25/14  Yes Gina L Collins, PA-C  citalopram (CELEXA) 20 MG tablet Take 20 mg by mouth daily.   Yes Historical Provider, MD  clopidogrel (PLAVIX) 75 MG tablet Take 1 tablet (75 mg total) by mouth daily. 09/26/14  Yes Cameron Sprang, MD  diltiazem (CARDIZEM CD) 240 MG 24 hr capsule Take 240 mg by mouth every morning.   Yes Historical Provider, MD  Fish Oil-Cholecalciferol (FISH OIL + D3 PO) Take 1 capsule by mouth 4 (four) times daily.    Yes Historical Provider, MD  FLUoxetine (PROZAC) 10 MG capsule Take 10 mg by mouth every morning.   Yes Historical Provider, MD  furosemide (LASIX) 40 MG tablet Take 1 tablet (40 mg total) by mouth daily. 02/27/14  Yes Nita Sells, MD  gabapentin (NEURONTIN) 300 MG capsule Take 300 mg by mouth at bedtime.   Yes Historical Provider, MD  glipiZIDE (GLUCOTROL) 10 MG tablet Take 10 mg by mouth 2 (two) times daily.   Yes Historical Provider, MD  ipratropium-albuterol (DUONEB) 0.5-2.5 (3) MG/3ML SOLN Take 3 mLs by nebulization every 6 (six) hours as needed (Shortness of breath).  Yes Historical Provider, MD  isosorbide mononitrate (IMDUR) 60 MG 24 hr tablet Take 60 mg by mouth every morning.    Yes  Historical Provider, MD  nitroGLYCERIN (NITROSTAT) 0.4 MG SL tablet Place 1 tablet (0.4 mg total) under the tongue every 5 (five) minutes as needed for chest pain. 06/03/12  Yes Ivor Costa, MD  ranitidine (ZANTAC) 300 MG tablet Take 300 mg by mouth every morning.    Yes Historical Provider, MD  rosuvastatin (CRESTOR) 20 MG tablet Take 20 mg by mouth daily.   Yes Historical Provider, MD  solifenacin (VESICARE) 5 MG tablet Take 5 mg by mouth daily.   Yes Historical Provider, MD  tamsulosin (FLOMAX) 0.4 MG CAPS capsule Take 1 capsule (0.4 mg total) by mouth daily after supper. 05/25/14  Yes Coolidge Breeze, PA-C  oxyCODONE (ROXICODONE) 5 MG immediate release tablet Take 1 tablet (5 mg total) by mouth every 6 (six) hours as needed. 10/08/14   Samantha J Rhyne, PA-C   No Known Allergies  FAMILY HISTORY:  indicated that his mother is alive.  SOCIAL HISTORY:  reports that he has been smoking Cigarettes.  He started smoking about 42 years ago. He has a 20.5 pack-year smoking history. He has never used smokeless tobacco. He reports that he does not drink alcohol or use illicit drugs.  REVIEW OF SYSTEMS:   Negative except above  SUBJECTIVE:   VITAL SIGNS: Temp:  [97.6 F (36.4 C)-98.7 F (37.1 C)] 98.6 F (37 C) (03/22 0917) Pulse Rate:  [72-89] 81 (03/22 0400) Resp:  [15-22] 15 (03/22 0400) BP: (79-117)/(38-62) 117/54 mmHg (03/22 0917) SpO2:  [88 %-94 %] 91 % (03/22 0400) Arterial Line BP: (58-99)/(40-58) 58/53 mmHg (03/22 0400) HEMODYNAMICS:   VENTILATOR SETTINGS:   INTAKE / OUTPUT:  Intake/Output Summary (Last 24 hours) at 10/09/14 0951 Last data filed at 10/09/14 0500  Gross per 24 hour  Intake   2381 ml  Output   1475 ml  Net    906 ml    PHYSICAL EXAMINATION: General:  Ill appearing Neuro:  Somnolent, follows commands, moves all extremities HEENT:  BiPAP mask in place, Rt neck wound clean Cardiovascular:  Regular, no murmur Lungs:  B/l crackles Abdomen:  Soft, non  tender Musculoskeletal:  1+ edema Skin:  No rashes  LABS:  CBC  Recent Labs Lab 10/05/14 1059 10/09/14 0500  WBC 10.8* 12.2*  HGB 12.8* 11.0*  HCT 39.0 34.7*  PLT 214 186   Coag's  Recent Labs Lab 10/05/14 1059  APTT 32  INR 1.05   BMET  Recent Labs Lab 10/05/14 1059 10/09/14 0500  NA 136 131*  K 4.6 5.4*  CL 103 101  CO2 21 22  BUN 25* 40*  CREATININE 2.25* 3.16*  GLUCOSE 157* 128*   Electrolytes  Recent Labs Lab 10/05/14 1059 10/09/14 0500  CALCIUM 9.3 8.3*   Sepsis Markers No results for input(s): LATICACIDVEN, PROCALCITON, O2SATVEN in the last 168 hours. ABG  Recent Labs Lab 10/09/14 0835  PHART 7.210*  PCO2ART 52.3*  PO2ART 81.9   Liver Enzymes  Recent Labs Lab 10/05/14 1059  AST 15  ALT 13  ALKPHOS 72  BILITOT 0.7  ALBUMIN 3.7   Cardiac Enzymes No results for input(s): TROPONINI, PROBNP in the last 168 hours. Glucose  Recent Labs Lab 10/08/14 0721 10/08/14 1059 10/08/14 1840 10/08/14 2120  GLUCAP 134* 176* 100* 120*    Imaging No results found.   ASSESSMENT / PLAN:  PULMONARY A: Acute respiratory failure with  possible PNA. Hx of ILD, COPD. P:   BiPAP Oxygen to keep SpO2 > 92% Scheduled duoneb F/u CXR, ABG Hold symbicort  CARDIOVASCULAR Lt radial aline 3/21 >> A:  Hypotension. Hx of HTN, HLD, CAD S/p Rt CEA 3/21. P:  Hold cardizem, imdur Post op care per VVS  RENAL A:   AKI. Metabolic acidosis. Hyperkalemia. Hx of CKD, s/p Rt nephrectomy for renal cell carcinoma. P:   Monitor renal fx, urine outpt  GASTROINTESTINAL A:   Hx of GERD. P:   NPO with sips for meds Protonix  HEMATOLOGIC A:   Leukocytosis. P:  F/u CBC SCDs  INFECTIOUS A:   Possible PNA. P:   Day 1 vancomycin, zosyn Check lactic acid, procalcitonin  Blood cx 3/22  ENDOCRINE A:   Hx of DM type II with renal disease. P:   SSI  NEUROLOGIC A:   Acute metabolic encephalopathy. Hx of depression, anxiety. P:    Hold celexa, prozac, neurotin Monitor mental status  Transfer to ICU  CC time 40 minutes.  Chesley Mires, MD [facility] Pulmonary/Critical Care 10/09/2014, 10:08 AM Pager:  [phone] After 3pm call: [phone]

## 2014-10-09 NOTE — Progress Notes (Signed)
Report called to [place] RN on 74M. Pt and pt's family members aware of transfer.

## 2014-10-09 NOTE — Consult Note (Signed)
Referring Provider: No ref. provider found Primary Care Physician:  [name], BETTY G, MD   Reason for Consultation:   Acute non oliguric renal failure in setting of carotid endarterctomy 3/21  And ILD   HPI: Patient appears to have fairly stable renal fuction at baseline  Creatinine about 1.4  . He has a history of a laparoscopic nephrectomy in 8/15  Type 2 diabetes 2011  And substance abuse. Underwent carotid endarterctomy 3/21 but has developed oliguric renal failure insetting of hypotension and now worsening respiratory failure.  Past Medical History  Diagnosis Date  . Essential hypertension, benign   . TIA (transient ischemic attack) 1997    No residual neurological deficits.  . Noncompliance   . COPD (chronic obstructive pulmonary disease)   . Hyperlipidemia   . History of pneumonia   . Type 2 diabetes mellitus 2011  . GERD (gastroesophageal reflux disease)   . Substance abuse   . Cocaine abuse     02-14-14 discusses freely with medical team, prefers no discussions with family present.  . Cholecystitis, acute 12/20/2013    Lap chole 6/5  . Coronary atherosclerosis of native coronary artery     a. 03/09/2013 Cath/PCI: LM nl, LAD: 50p, 56m (2.5x16 promus DES), LCX nl, OM1 min irregs, LPL/LPDA diff dzs, RCA nondom, mod diff dzs, EF 55%.  . Depression   . Anxiety   . History of nephrolithiasis   . Arthritis   . Hepatitis Late 1970s  . Anginal pain     last tx last week.  . Stroke     lft sided weakness  . Sleep apnea     On CPAP, 4L O2 no cpap at home yet  . Shortness of breath dyspnea   . Pneumonia     6/15  . Renal cell carcinoma     Status post radical rt nephrectomy August 2015  . Headache     Past Surgical History  Procedure Laterality Date  . Appendectomy  1970's  . Cholecystectomy N/A 12/22/2013    Procedure: LAPAROSCOPIC CHOLECYSTECTOMY ;  Surgeon: [name], MD;  Location: [place];  Service: General;  Laterality: N/A;  . Robot assisted laparoscopic  nephrectomy Right 02/21/2014    Procedure: ROBOTIC ASSISTED LAPAROSCOPIC RIGHT NEPHRECTOMY ;  Surgeon: [name], MD;  Location: [facility];  Service: Urology;  Laterality: Right;  . Laparoscopic lysis of adhesions  02/21/2014    Procedure: LAPAROSCOPIC LYSIS OF ADHESIONS EXTINSIVE;  Surgeon: [name], MD;  Location: [facility];  Service: Urology;;  . Video assisted thoracoscopy Left 05/18/2014    Procedure: LEFT VIDEO ASSISTED THORACOSCOPY;  Surgeon: [name], MD;  Location: [place];  Service: Thoracic;  Laterality: Left;  . Lung biopsy Left 05/18/2014    Procedure: LUNG BIOPSY left upper lobe & left lower lobe;  Surgeon: [name], MD;  Location: [place];  Service: Thoracic;  Laterality: Left;  . Video bronchoscopy  05/18/2014    Procedure: VIDEO BRONCHOSCOPY;  Surgeon: [name], MD;  Location: [place];  Service: Thoracic;;  . Left heart catheterization with coronary angiogram N/A 06/02/2012    Procedure: LEFT HEART CATHETERIZATION WITH CORONARY ANGIOGRAM;  Surgeon: [name], MD;  [facility];  Service: Cardiovascular;  Laterality: N/A;  . Left heart catheterization with coronary angiogram N/A 03/09/2013    Procedure: LEFT HEART CATHETERIZATION WITH CORONARY ANGIOGRAM;  Surgeon: [name], MD;  Location: [facility] CATH LAB;  Service: Cardiovascular;  Laterality: N/A;  . Left heart catheterization  with coronary angiogram N/A 12/20/2013    Procedure: LEFT HEART CATHETERIZATION WITH CORONARY ANGIOGRAM;  Surgeon: [name], MD;  Location: [facility] CATH LAB;  Service: Cardiovascular;  Laterality: N/A;  . Cardiac catheterization      stent6/15    Prior to Admission medications   Medication Sig Start Date End Date Taking? Authorizing Provider  albuterol (PROVENTIL HFA;VENTOLIN HFA) 108 (90 BASE) MCG/ACT inhaler Inhale 2 puffs into the lungs every 6 (six) hours as needed for wheezing or shortness of breath.   Yes Historical Provider, MD  albuterol  (PROVENTIL) (2.5 MG/3ML) 0.083% nebulizer solution Take 3 mLs (2.5 mg total) by nebulization every 3 (three) hours as needed for wheezing or shortness of breath. 02/27/14  Yes Nita Sells, MD  alfuzosin (UROXATRAL) 10 MG 24 hr tablet Take 10 mg by mouth daily with breakfast.   Yes Historical Provider, MD  budesonide-formoterol (SYMBICORT) 160-4.5 MCG/ACT inhaler Inhale 2 puffs into the lungs 2 (two) times daily. 05/25/14  Yes Gina L Collins, PA-C  citalopram (CELEXA) 20 MG tablet Take 20 mg by mouth daily.   Yes Historical Provider, MD  clopidogrel (PLAVIX) 75 MG tablet Take 1 tablet (75 mg total) by mouth daily. 09/26/14  Yes Cameron Sprang, MD  diltiazem (CARDIZEM CD) 240 MG 24 hr capsule Take 240 mg by mouth every morning.   Yes Historical Provider, MD  Fish Oil-Cholecalciferol (FISH OIL + D3 PO) Take 1 capsule by mouth 4 (four) times daily.    Yes Historical Provider, MD  FLUoxetine (PROZAC) 10 MG capsule Take 10 mg by mouth every morning.   Yes Historical Provider, MD  furosemide (LASIX) 40 MG tablet Take 1 tablet (40 mg total) by mouth daily. 02/27/14  Yes Nita Sells, MD  gabapentin (NEURONTIN) 300 MG capsule Take 300 mg by mouth at bedtime.   Yes Historical Provider, MD  glipiZIDE (GLUCOTROL) 10 MG tablet Take 10 mg by mouth 2 (two) times daily.   Yes Historical Provider, MD  ipratropium-albuterol (DUONEB) 0.5-2.5 (3) MG/3ML SOLN Take 3 mLs by nebulization every 6 (six) hours as needed (Shortness of breath).   Yes Historical Provider, MD  isosorbide mononitrate (IMDUR) 60 MG 24 hr tablet Take 60 mg by mouth every morning.    Yes Historical Provider, MD  nitroGLYCERIN (NITROSTAT) 0.4 MG SL tablet Place 1 tablet (0.4 mg total) under the tongue every 5 (five) minutes as needed for chest pain. 06/03/12  Yes Ivor Costa, MD  ranitidine (ZANTAC) 300 MG tablet Take 300 mg by mouth every morning.    Yes Historical Provider, MD  rosuvastatin (CRESTOR) 20 MG tablet Take 20 mg by mouth daily.    Yes Historical Provider, MD  solifenacin (VESICARE) 5 MG tablet Take 5 mg by mouth daily.   Yes Historical Provider, MD  tamsulosin (FLOMAX) 0.4 MG CAPS capsule Take 1 capsule (0.4 mg total) by mouth daily after supper. 05/25/14  Yes Coolidge Breeze, PA-C  oxyCODONE (ROXICODONE) 5 MG immediate release tablet Take 1 tablet (5 mg total) by mouth every 6 (six) hours as needed. 10/08/14   Gabriel Earing, PA-C    Current Facility-Administered Medications  Medication Dose Route Frequency Provider Last Rate Last Dose  . 0.9 %  sodium chloride infusion   Intravenous Continuous Chesley Mires, MD      . acetaminophen (TYLENOL) tablet 325-650 mg  325-650 mg Oral Q4H PRN Hulen Shouts Rhyne, PA-C   650 mg at 10/08/14 2115   Or  . acetaminophen (TYLENOL) suppository 325-650  mg  325-650 mg Rectal Q4H PRN Samantha J Rhyne, PA-C      . albuterol (PROVENTIL) (2.5 MG/3ML) 0.083% nebulizer solution 2.5 mg  2.5 mg Nebulization Q3H PRN Samantha J Rhyne, PA-C      . bisacodyl (DULCOLAX) EC tablet 5 mg  5 mg Oral Daily PRN Samantha J Rhyne, PA-C      . clopidogrel (PLAVIX) tablet 75 mg  75 mg Oral Daily Samantha J Rhyne, PA-C   75 mg at 10/09/14 0916  . darifenacin (ENABLEX) 24 hr tablet 7.5 mg  7.5 mg Oral Daily Hulen Shouts Rhyne, PA-C   7.5 mg at 10/09/14 0916  . docusate sodium (COLACE) capsule 100 mg  100 mg Oral Daily Hulen Shouts Rhyne, PA-C   100 mg at 10/09/14 0915  . DOPamine (INTROPIN) 800 mg in dextrose 5 % 250 mL (3.2 mg/mL) infusion  0-20 mcg/kg/min Intravenous PRN Samantha J Rhyne, PA-C      . famotidine (PEPCID) tablet 40 mg  40 mg Oral Daily Samantha J Rhyne, PA-C   40 mg at 10/09/14 0916  . fentaNYL (SUBLIMAZE) injection 25 mcg  25 mcg Intravenous Q2H PRN Chesley Mires, MD      . FLUoxetine (PROZAC) capsule 10 mg  10 mg Oral Daily Samantha J Rhyne, PA-C   10 mg at 10/09/14 7829  . hydrALAZINE (APRESOLINE) injection 5 mg  5 mg Intravenous Q20 Min PRN Samantha J Rhyne, PA-C      . insulin aspart (novoLOG)  injection 0-9 Units  0-9 Units Subcutaneous 6 times per day Chesley Mires, MD      . ipratropium-albuterol (DUONEB) 0.5-2.5 (3) MG/3ML nebulizer solution 3 mL  3 mL Nebulization Q6H Vineet Sood, MD      . labetalol (NORMODYNE,TRANDATE) injection 10 mg  10 mg Intravenous Q10 min PRN Samantha J Rhyne, PA-C      . metoprolol (LOPRESSOR) injection 2-5 mg  2-5 mg Intravenous Q2H PRN Samantha J Rhyne, PA-C      . nitroGLYCERIN (NITROSTAT) SL tablet 0.4 mg  0.4 mg Sublingual Q5 min PRN Samantha J Rhyne, PA-C      . ondansetron (ZOFRAN) injection 4 mg  4 mg Intravenous Q6H PRN Samantha J Rhyne, PA-C      . pantoprazole (PROTONIX) injection 40 mg  40 mg Intravenous Q24H Vineet Sood, MD      . rosuvastatin (CRESTOR) tablet 20 mg  20 mg Oral q1800 Samantha J Rhyne, PA-C   20 mg at 10/08/14 1834  . senna-docusate (Senokot-S) tablet 1 tablet  1 tablet Oral QHS PRN Samantha J Rhyne, PA-C      . tamsulosin (FLOMAX) capsule 0.8 mg  0.8 mg Oral QPC supper Samantha J Rhyne, PA-C        Allergies as of 10/03/2014  . (No Known Allergies)    Family History  Problem Relation Age of Onset  . Hypertension    . Diabetes    . Stroke    . Lung cancer Father   . Cancer Mother     Thyroid - living in her 50's.  . Cancer Maternal Grandmother     Breast  . Cancer Maternal Grandfather     Throat and stomach  . CAD Father   . CAD Brother     History   Social History  . Marital Status: Single    Spouse Name: N/A  . Number of Children: N/A  . Years of Education: N/A   Occupational History  . N/A    Social  History Main Topics  . Smoking status: Current Every Day Smoker -- 0.50 packs/day for 41 years    Types: Cigarettes    Start date: 05/03/1972  . Smokeless tobacco: Never Used     Comment: smokes 1/2 pack daily  . Alcohol Use: No  . Drug Use: No     Comment: last used cocaine 11/2013. but is cocaine + this admit July 2015  . Sexual Activity: No   Other Topics Concern  . Not on file   Social  History Narrative   Lives in [place] with his mother.  He does not work.    Review of Systems: Unable to obtain  Physical Exam: Vital signs in last 24 hours: Temp:  [97.6 F (36.4 C)-98.7 F (37.1 C)] 98.6 F (37 C) (03/22 0917) Pulse Rate:  [72-85] 85 (03/22 1004) Resp:  [15-22] 19 (03/22 1004) BP: (79-117)/(38-62) 117/54 mmHg (03/22 0917) SpO2:  [88 %-94 %] 90 % (03/22 1004) Arterial Line BP: (58-99)/(41-58) 58/53 mmHg (03/22 0400) FiO2 (%):  [50 %] 50 % (03/22 1004) Last BM Date: 10/08/14 General:   Using non re breather  Head:  Normocephalic and atraumatic. Eyes:  Sclera clear, no icterus.   Conjunctiva pink. Ears:  Normal auditory acuity. Nose:  No deformity, discharge,  or lesions. Mouth:  No deformity or lesions, dentition normal. Neck:  Supple; no masses or thyromegaly. JVP not elevated Lungs:  Diminished air entry  Heart:  Regular rate and rhythm; no murmurs, clicks, rubs,  or gallops. Abdomen:  Soft, nontender and nondistended. No masses, hepatosplenomegaly or hernias noted. Normal bowel sounds, without guarding, and without rebound.   Msk:  Symmetrical without gross deformities. Normal posture. Pulses:  No carotid, renal, femoral bruits. DP and PT symmetrical and equal Extremities:  Without clubbing or edema. Neurologic:  lethargic Skin:  Intact without significant lesions or rashes. Cervical Nodes:  No significant cervical adenopathy.   Intake/Output from previous day: 03/21 0701 - 03/22 0700 In: 2381 [I.V.:881; IV Piggyback:1500] Out: 1500 [Urine:1400; Blood:100] Intake/Output this shift:    Lab Results:  Recent Labs  10/09/14 0500  WBC 12.2*  HGB 11.0*  HCT 34.7*  PLT 186   BMET  Recent Labs  10/09/14 0500  NA 131*  K 5.4*  CL 101  CO2 22  GLUCOSE 128*  BUN 40*  CREATININE 3.16*  CALCIUM 8.3*   LFT No results for input(s): PROT, ALBUMIN, AST, ALT, ALKPHOS, BILITOT, BILIDIR, IBILI in the last 72 hours. PT/INR No results for  input(s): LABPROT, INR in the last 72 hours. Hepatitis Panel No results for input(s): HEPBSAG, HCVAB, HEPAIGM, HEPBIGM in the last 72 hours.  Studies/Results: Dg Chest Port 1 View  10/09/2014   CLINICAL DATA:  59 year old male with increased shortness of Breath. Initial encounter. Personal history of renal cell carcinoma status post right nephrectomy.  EXAM: PORTABLE CHEST - 1 VIEW  COMPARISON:  09/07/2014 and earlier.  FINDINGS: Portable AP semi upright view at 0949 hours. Lordotic view. Lower lung volumes. Increased interstitial opacity bilaterally. Mildly confluent bibasilar opacity. No pneumothorax or definite pleural effusion.  IMPRESSION: Lower lung volumes with increased interstitial and basilar opacity in both lungs which could reflect a combination of atelectasis and pulmonary edema versus pneumonia.   Electronically Signed   By: Genevie Ann M.D.   On: 10/09/2014 10:12    Assessment/Plan:  Acute kidney injury most likely due to ischemic ATN with remarkable drop in blood pressure and patient now with worsening respiratory failure . Will check  urine micro and ultrasound of kidneys  Hypotension fluid and pressor support appreciate VVS  Consider sepsis  Electrolytes  Slight increase in K will hoperfully start to improve would treat with Insulin/glucose  Metabolic acidosis controlled   LOS: 1 Rykar Lebleu W @TODAY @11 :14 AM

## 2014-10-09 NOTE — Progress Notes (Signed)
59fr foley placed per MD order d/t acute urinary retention. Pt tolerated procedure well. Peri care care with soap and water provided prior to insertion and sterile procedure maintained throughout. Amber urine returned, will continue to monitor.

## 2014-10-09 NOTE — Progress Notes (Signed)
ANTIBIOTIC CONSULT NOTE - INITIAL  Pharmacy Consult for Vancomycin, Zosyn  Indication: Aspiration pneumonia   No Known Allergies  Patient Measurements: Height: 6' (182.9 cm) Weight: 246 lb (111.585 kg) IBW/kg (Calculated) : 77.6  Vital Signs: Temp: 97.9 F (36.6 C) (03/22 1122) Temp Source: Oral (03/22 1122) BP: 117/54 mmHg (03/22 0917) Pulse Rate: 85 (03/22 1004) Intake/Output from previous day: 03/21 0701 - 03/22 0700 In: 2381 [I.V.:881; IV Piggyback:1500] Out: 1500 [Urine:1400; Blood:100] Intake/Output from this shift:    Labs:  Recent Labs  10/09/14 0500  WBC 12.2*  HGB 11.0*  PLT 186  CREATININE 3.16*   Estimated Creatinine Clearance: 32.5 mL/min (by C-G formula based on Cr of 3.16). No results for input(s): VANCOTROUGH, VANCOPEAK, VANCORANDOM, GENTTROUGH, GENTPEAK, GENTRANDOM, TOBRATROUGH, TOBRAPEAK, TOBRARND, AMIKACINPEAK, AMIKACINTROU, AMIKACIN in the last 72 hours.   Microbiology: Recent Results (from the past 720 hour(s))  Surgical pcr screen     Status: None   Collection Time: 10/05/14 10:59 AM  Result Value Ref Range Status   MRSA, PCR NEGATIVE NEGATIVE Final   Staphylococcus aureus NEGATIVE NEGATIVE Final    Comment:        The Xpert SA Assay (FDA approved for NASAL specimens in patients over 91 years of age), is one component of a comprehensive surveillance program.  Test performance has been validated by [facility] for patients greater than or equal to 60 year old. It is not intended to diagnose infection nor to guide or monitor treatment.     Medical History: Past Medical History  Diagnosis Date  . Essential hypertension, benign   . TIA (transient ischemic attack) 1997    No residual neurological deficits.  . Noncompliance   . COPD (chronic obstructive pulmonary disease)   . Hyperlipidemia   . History of pneumonia   . Type 2 diabetes mellitus 2011  . GERD (gastroesophageal reflux disease)   . Substance abuse   . Cocaine abuse      02-14-14 discusses freely with medical team, prefers no discussions with family present.  . Cholecystitis, acute 12/20/2013    Lap chole 6/5  . Coronary atherosclerosis of native coronary artery     a. 03/09/2013 Cath/PCI: LM nl, LAD: 50p, 84m (2.5x16 promus DES), LCX nl, OM1 min irregs, LPL/LPDA diff dzs, RCA nondom, mod diff dzs, EF 55%.  . Depression   . Anxiety   . History of nephrolithiasis   . Arthritis   . Hepatitis Late 1970s  . Anginal pain     last tx last week.  . Stroke     lft sided weakness  . Sleep apnea     On CPAP, 4L O2 no cpap at home yet  . Shortness of breath dyspnea   . Pneumonia     6/15  . Renal cell carcinoma     Status post radical rt nephrectomy August 2015  . Headache     Medications:  Prescriptions prior to admission  Medication Sig Dispense Refill Last Dose  . albuterol (PROVENTIL HFA;VENTOLIN HFA) 108 (90 BASE) MCG/ACT inhaler Inhale 2 puffs into the lungs every 6 (six) hours as needed for wheezing or shortness of breath.   10/07/2014 at Unknown time  . albuterol (PROVENTIL) (2.5 MG/3ML) 0.083% nebulizer solution Take 3 mLs (2.5 mg total) by nebulization every 3 (three) hours as needed for wheezing or shortness of breath. 75 mL 12 10/07/2014 at Unknown time  . alfuzosin (UROXATRAL) 10 MG 24 hr tablet Take 10 mg by mouth daily with breakfast.  10/08/2014 at Unknown time  . budesonide-formoterol (SYMBICORT) 160-4.5 MCG/ACT inhaler Inhale 2 puffs into the lungs 2 (two) times daily. 1 Inhaler 3 10/07/2014 at Unknown time  . citalopram (CELEXA) 20 MG tablet Take 20 mg by mouth daily.   10/08/2014 at Unknown time  . clopidogrel (PLAVIX) 75 MG tablet Take 1 tablet (75 mg total) by mouth daily. 30 tablet 11 10/08/2014 at Unknown time  . diltiazem (CARDIZEM CD) 240 MG 24 hr capsule Take 240 mg by mouth every morning.   10/08/2014 at Unknown time  . Fish Oil-Cholecalciferol (FISH OIL + D3 PO) Take 1 capsule by mouth 4 (four) times daily.    Past Week at Unknown time   . FLUoxetine (PROZAC) 10 MG capsule Take 10 mg by mouth every morning.   10/08/2014 at Unknown time  . furosemide (LASIX) 40 MG tablet Take 1 tablet (40 mg total) by mouth daily. 30 tablet 0 10/07/2014 at Unknown time  . gabapentin (NEURONTIN) 300 MG capsule Take 300 mg by mouth at bedtime.   10/07/2014 at Unknown time  . glipiZIDE (GLUCOTROL) 10 MG tablet Take 10 mg by mouth 2 (two) times daily.   10/07/2014 at Unknown time  . ipratropium-albuterol (DUONEB) 0.5-2.5 (3) MG/3ML SOLN Take 3 mLs by nebulization every 6 (six) hours as needed (Shortness of breath).   10/07/2014 at Unknown time  . isosorbide mononitrate (IMDUR) 60 MG 24 hr tablet Take 60 mg by mouth every morning.    10/08/2014 at Unknown time  . nitroGLYCERIN (NITROSTAT) 0.4 MG SL tablet Place 1 tablet (0.4 mg total) under the tongue every 5 (five) minutes as needed for chest pain. 30 tablet 1 Past Week at Unknown time  . ranitidine (ZANTAC) 300 MG tablet Take 300 mg by mouth every morning.    10/07/2014 at Unknown time  . rosuvastatin (CRESTOR) 20 MG tablet Take 20 mg by mouth daily.   10/07/2014 at Unknown time  . solifenacin (VESICARE) 5 MG tablet Take 5 mg by mouth daily.   10/07/2014 at Unknown time  . tamsulosin (FLOMAX) 0.4 MG CAPS capsule Take 1 capsule (0.4 mg total) by mouth daily after supper. 30 capsule 1 10/08/2014 at Unknown time   Assessment: 59 YOM admitted for Rt cardotid endarterectomy which was done on 3/21. Developed respiratory distress and acidosis on 3/22. Pharmacy consulted to start Vancomycin and zosyn for asp PNA. WBC elevated at 12.2, afebrile. CrCl ~ 30-35 mL/min   Vanc 3/22>> Zosyn 3/22>>   3/22 Blood Cx x2>>    Goal of Therapy:  Vancomycin trough level 15-20 mcg/ml  Plan:  -Vancomycin 2500 mg IV load followed by Vanc 1250 mg IV Q 24 hours -Zosyn 3.375 gm IV Q 8 hours  -Monitor CBC, renal fx, cultures and clinical progress  -[facility] at [place], PharmD., BCPS Clinical Pharmacist Pager  [phone]

## 2014-10-10 ENCOUNTER — Inpatient Hospital Stay (HOSPITAL_COMMUNITY): Payer: Medicaid Other

## 2014-10-10 DIAGNOSIS — J849 Interstitial pulmonary disease, unspecified: Secondary | ICD-10-CM

## 2014-10-10 DIAGNOSIS — J690 Pneumonitis due to inhalation of food and vomit: Secondary | ICD-10-CM

## 2014-10-10 DIAGNOSIS — J449 Chronic obstructive pulmonary disease, unspecified: Secondary | ICD-10-CM

## 2014-10-10 LAB — URINE MICROSCOPIC-ADD ON

## 2014-10-10 LAB — BASIC METABOLIC PANEL
Anion gap: 5 (ref 5–15)
BUN: 36 mg/dL — AB (ref 6–23)
CHLORIDE: 106 mmol/L (ref 96–112)
CO2: 24 mmol/L (ref 19–32)
CREATININE: 2.88 mg/dL — AB (ref 0.50–1.35)
Calcium: 8.7 mg/dL (ref 8.4–10.5)
GFR calc non Af Amer: 22 mL/min — ABNORMAL LOW (ref >90)
GFR, EST AFRICAN AMERICAN: 26 mL/min — AB (ref >90)
GLUCOSE: 128 mg/dL — AB (ref 70–99)
Potassium: 5.6 mmol/L — ABNORMAL HIGH (ref 3.5–5.1)
Sodium: 135 mmol/L (ref 135–145)

## 2014-10-10 LAB — GLUCOSE, CAPILLARY
GLUCOSE-CAPILLARY: 131 mg/dL — AB (ref 70–99)
GLUCOSE-CAPILLARY: 145 mg/dL — AB (ref 70–99)
Glucose-Capillary: 122 mg/dL — ABNORMAL HIGH (ref 70–99)
Glucose-Capillary: 120 mg/dL — ABNORMAL HIGH (ref 70–99)
Glucose-Capillary: 102 mg/dL — ABNORMAL HIGH (ref 70–99)

## 2014-10-10 LAB — RENAL FUNCTION PANEL
ALBUMIN: 3.2 g/dL — AB (ref 3.5–5.2)
ANION GAP: 5 (ref 5–15)
BUN: 30 mg/dL — AB (ref 6–23)
CO2: 26 mmol/L (ref 19–32)
CREATININE: 2.41 mg/dL — AB (ref 0.50–1.35)
Calcium: 9.2 mg/dL (ref 8.4–10.5)
Chloride: 107 mmol/L (ref 96–112)
GFR calc Af Amer: 32 mL/min — ABNORMAL LOW (ref >90)
GFR calc non Af Amer: 28 mL/min — ABNORMAL LOW (ref >90)
Glucose, Bld: 97 mg/dL (ref 70–99)
POTASSIUM: 5.3 mmol/L — AB (ref 3.5–5.1)
Phosphorus: 2.9 mg/dL (ref 2.3–4.6)
Sodium: 138 mmol/L (ref 135–145)

## 2014-10-10 LAB — URINALYSIS, ROUTINE W REFLEX MICROSCOPIC
Bilirubin Urine: NEGATIVE
Glucose, UA: NEGATIVE mg/dL
KETONES UR: NEGATIVE mg/dL
LEUKOCYTES UA: NEGATIVE
NITRITE: NEGATIVE
PH: 6 (ref 5.0–8.0)
Protein, ur: NEGATIVE mg/dL
Specific Gravity, Urine: 1.015 (ref 1.005–1.030)
Urobilinogen, UA: 0.2 mg/dL (ref 0.0–1.0)

## 2014-10-10 LAB — CBC
HCT: 32.6 % — ABNORMAL LOW (ref 39.0–52.0)
Hemoglobin: 10.3 g/dL — ABNORMAL LOW (ref 13.0–17.0)
MCH: 27.7 pg (ref 26.0–34.0)
MCHC: 31.6 g/dL (ref 30.0–36.0)
MCV: 87.6 fL (ref 78.0–100.0)
PLATELETS: 150 10*3/uL (ref 150–400)
RBC: 3.72 MIL/uL — AB (ref 4.22–5.81)
RDW: 15.5 % (ref 11.5–15.5)
WBC: 9.1 10*3/uL (ref 4.0–10.5)

## 2014-10-10 LAB — LACTIC ACID, PLASMA: LACTIC ACID, VENOUS: 0.8 mmol/L (ref 0.5–2.0)

## 2014-10-10 MED ORDER — INSULIN ASPART 100 UNIT/ML ~~LOC~~ SOLN
0.0000 [IU] | Freq: Three times a day (TID) | SUBCUTANEOUS | Status: DC
  Administered 2014-10-11 – 2014-10-13 (×6): 3 [IU] via SUBCUTANEOUS

## 2014-10-10 MED ORDER — CITALOPRAM HYDROBROMIDE 20 MG PO TABS
20.0000 mg | ORAL_TABLET | Freq: Every day | ORAL | Status: DC
  Administered 2014-10-10 – 2014-10-13 (×4): 20 mg via ORAL
  Filled 2014-10-10 (×4): qty 1

## 2014-10-10 MED ORDER — GABAPENTIN 300 MG PO CAPS
300.0000 mg | ORAL_CAPSULE | Freq: Every day | ORAL | Status: DC
  Administered 2014-10-10 – 2014-10-12 (×3): 300 mg via ORAL
  Filled 2014-10-10 (×5): qty 1

## 2014-10-10 NOTE — Progress Notes (Signed)
[facility] ROUNDING NOTE   Subjective:   Interval History: improved urine output  Creatinine decreased  Objective:  Vital signs in last 24 hours:  Temp:  [97.9 F (36.6 C)-99.4 F (37.4 C)] 98.7 F (37.1 C) (03/23 0345) Pulse Rate:  [81-102] 86 (03/23 0600) Resp:  [13-28] 28 (03/23 0600) BP: (87-127)/(40-70) 126/55 mmHg (03/23 0600) SpO2:  [86 %-97 %] 91 % (03/23 0600) Arterial Line BP: (73-139)/(44-112) 139/112 mmHg (03/23 0600) FiO2 (%):  [50 %-100 %] 100 % (03/23 0400) Weight:  [111.1 kg (244 lb 14.9 oz)] 111.1 kg (244 lb 14.9 oz) (03/23 0341)  Weight change: -0.485 kg (-1 lb 1.1 oz) Filed Weights   10/08/14 0713 10/10/14 0341  Weight: 111.585 kg (246 lb) 111.1 kg (244 lb 14.9 oz)    Intake/Output: I/O last 3 completed shifts: In: 1282.5 [P.O.:25; I.V.:540; Other:80; IV Piggyback:637.5] Out: 9381 [Urine:3785]   Intake/Output this shift:     CVS- RRR RS- CTA ABD- BS present soft non-distended EXT- no edema   Basic Metabolic Panel:  Recent Labs Lab 10/05/14 1059 10/09/14 0500 10/10/14 0400  NA 136 131* 135  K 4.6 5.4* 5.6*  CL 103 101 106  CO2 21 22 24   GLUCOSE 157* 128* 128*  BUN 25* 40* 36*  CREATININE 2.25* 3.16* 2.88*  CALCIUM 9.3 8.3* 8.7    Liver Function Tests:  Recent Labs Lab 10/05/14 1059  AST 15  ALT 13  ALKPHOS 72  BILITOT 0.7  PROT 7.5  ALBUMIN 3.7   No results for input(s): LIPASE, AMYLASE in the last 168 hours. No results for input(s): AMMONIA in the last 168 hours.  CBC:  Recent Labs Lab 10/05/14 1059 10/09/14 0500 10/10/14 0400  WBC 10.8* 12.2* 9.1  HGB 12.8* 11.0* 10.3*  HCT 39.0 34.7* 32.6*  MCV 85.9 88.5 87.6  PLT 214 186 150    Cardiac Enzymes:  Recent Labs Lab 10/09/14 1150 10/09/14 1720 10/09/14 2154  TROPONINI <0.03 <0.03 0.03    BNP: Invalid input(s): POCBNP  CBG:  Recent Labs Lab 10/09/14 1118 10/09/14 1610 10/09/14 2030 10/09/14 2334 10/10/14 0343  GLUCAP 122* 112*  102* 121* 122*    Microbiology: Results for orders placed or performed during the hospital encounter of 10/08/14  Culture, blood (routine x 2)     Status: None (Preliminary result)   Collection Time: 10/09/14 11:30 AM  Result Value Ref Range Status   Specimen Description BLOOD LEFT ANTECUBITAL  Final   Special Requests BOTTLES DRAWN AEROBIC AND ANAEROBIC 10CC  Final   Culture PENDING  Incomplete   Report Status PENDING  Incomplete  Culture, blood (routine x 2)     Status: None (Preliminary result)   Collection Time: 10/09/14 11:38 AM  Result Value Ref Range Status   Specimen Description BLOOD RIGHT ANTECUBITAL  Final   Special Requests BOTTLES DRAWN AEROBIC AND ANAEROBIC 10CC  Final   Culture PENDING  Incomplete   Report Status PENDING  Incomplete    Coagulation Studies: No results for input(s): LABPROT, INR in the last 72 hours.  Urinalysis: No results for input(s): COLORURINE, LABSPEC, PHURINE, GLUCOSEU, HGBUR, BILIRUBINUR, KETONESUR, PROTEINUR, UROBILINOGEN, NITRITE, LEUKOCYTESUR in the last 72 hours.  Invalid input(s): APPERANCEUR    Imaging: Dg Chest Port 1 View  10/10/2014   CLINICAL DATA:  Aspiration pneumonia.  EXAM: PORTABLE CHEST - 1 VIEW  COMPARISON:  10/09/2014.  FINDINGS: Mediastinum and hilar structures stable. Stable cardiomegaly. Slight interim clearing of pulmonary interstitial prominence suggesting slight clearing of pulmonary  interstitial edema. Pneumonitis cannot be excluded. Small left pleural effusion. No pneumothorax. No acute osseus abnormality .  IMPRESSION: Congestive heart failure with partial clearing of pulmonary interstitial edema. Pneumonitis cannot be excluded. Small left pleural effusion.   Electronically Signed   By: Marcello Moores  Register   On: 10/10/2014 07:20   Dg Chest Port 1 View  10/09/2014   CLINICAL DATA:  59 year old male with increased shortness of Breath. Initial encounter. Personal history of renal cell carcinoma status post right  nephrectomy.  EXAM: PORTABLE CHEST - 1 VIEW  COMPARISON:  09/07/2014 and earlier.  FINDINGS: Portable AP semi upright view at 0949 hours. Lordotic view. Lower lung volumes. Increased interstitial opacity bilaterally. Mildly confluent bibasilar opacity. No pneumothorax or definite pleural effusion.  IMPRESSION: Lower lung volumes with increased interstitial and basilar opacity in both lungs which could reflect a combination of atelectasis and pulmonary edema versus pneumonia.   Electronically Signed   By: Genevie Ann M.D.   On: 10/09/2014 10:12     Medications:   . sodium chloride 30 mL/hr at 10/09/14 1800   . antiseptic oral rinse  7 mL Mouth Rinse q12n4p  . chlorhexidine  15 mL Mouth Rinse BID  . clopidogrel  75 mg Oral Daily  . docusate sodium  100 mg Oral Daily  . famotidine  40 mg Oral Daily  . FLUoxetine  10 mg Oral Daily  . insulin aspart  0-9 Units Subcutaneous 6 times per day  . ipratropium-albuterol  3 mL Nebulization Q6H  . pantoprazole (PROTONIX) IV  40 mg Intravenous Q24H  . piperacillin-tazobactam (ZOSYN)  IV  3.375 g Intravenous Q8H  . rosuvastatin  20 mg Oral q1800  . tamsulosin  0.8 mg Oral QPC supper  . vancomycin  1,250 mg Intravenous Q24H   acetaminophen **OR** acetaminophen, albuterol, bisacodyl, DOPamine, fentaNYL, hydrALAZINE, labetalol, metoprolol, nitroGLYCERIN, ondansetron, senna-docusate  Assessment/ Plan:   Acute kidney injury most likely due to ischemic ATN with remarkable drop in blood pressure and patient now with worsening respiratory failure . Will check urine micro and ultrasound of kidneys  Hypotension fluid and pressor support appreciate VVS Consider sepsis  Electrolytes Slight increase in K will hoperfully start to improve now urine output improved  Recheck today this PM  Metabolic acidosis controlled  Patient appears to be doing much better diuresed 2.3 L yesterday   Pressures have improved   LOS: 2 Promiss Labarbera W @TODAY @7 :35 AM

## 2014-10-10 NOTE — Progress Notes (Signed)
PCCM Interval Progress Note  Called to bedside to assess pt for agitation and restlessness.  Had been on BiPAP for the past several hours and was tolerating it somewhat.  As the night progressed, pt became more agitated demanding that BiPAP be taken off and that he have some water to drink.  There was some concern for possible deterioration and need for intubation by rounding team earlier in the day.  When I informed the pt that BiPAP was needed due to his desaturations and concern for further deterioration with possibility of requiring intubation, he adamantly stated that he was a "no code blue".  He states that he should have been listed as DNR / DNI prior to his surgery on 10/08/14 (right CEA) and is unsure why his code status is currently listed as Full Code.  I asked him if he knew what it meant to be a "no code" to which he answered appropriately.  I also asked him if he had ever discussed this with his family and he proceeded to tell me that he has talked to his mother about it and she should be able to verify this information.  I called his mother Junious Dresser and updated her on events of the night.  She did verify pt's wishes for DNR / DNI and stated that "yes, that is the way he feels and what he wants".  I asked a second time and confirmed that there was no confusion or questions.  Code status changed to DNR / DNI.   Montey Hora, [facility] Pager: [phone]  or [phone] 10/10/2014, 1:45 AM

## 2014-10-10 NOTE — Progress Notes (Signed)
PULMONARY / CRITICAL CARE MEDICINE   Name: [name] MRN: [id number] DOB: [birth year]    ADMISSION DATE:  10/08/2014 CONSULTATION DATE:  10/09/2014  REFERRING MD : Early  CHIEF COMPLAINT:  Respiratory distress  INITIAL PRESENTATION:  59 yo male smoker admitted for Rt carotid endarterectomy >> done 3/21.  Developed respiratory distress and acidosis 3/22 and PCCM consulted.  He is followed by Dr. Chase Caller for ILD.  STUDIES:   SIGNIFICANT EVENTS: 3/21 Rt carotid endarterectomy 3/22 To ICU  HISTORY OF PRESENT ILLNESS:   59 yo male smoker had neck surgery 3/21.  He developed respiratory distress and acidosis 3/22.  He was started on BiPAP and transfer to ICU.  He c/o chest discomfort and chest congestion.  He also has acute worsening of renal fx.  He had foley placed and nephrology was consulted.  SUBJECTIVE:  He wore BiPAP for part of the evening then could not tolerate. Good conversation with Junius Roads NP last night as documented. Pt's code status was changed to Full post procedure, but he is now able to explain that he should be DNR/I, wants this reflected in the orders. The official status was changed back to DNR last night. He is up to chair on 1.00 mask, comfortable  VITAL SIGNS: Temp:  [97.9 F (36.6 C)-99.4 F (37.4 C)] 98.3 F (36.8 C) (03/23 0753) Pulse Rate:  [81-102] 86 (03/23 0600) Resp:  [13-28] 28 (03/23 0600) BP: (87-127)/(40-70) 126/55 mmHg (03/23 0600) SpO2:  [86 %-98 %] 98 % (03/23 0745) Arterial Line BP: (73-139)/(44-112) 139/112 mmHg (03/23 0600) FiO2 (%):  [50 %-100 %] 100 % (03/23 0745) Weight:  [111.1 kg (244 lb 14.9 oz)] 111.1 kg (244 lb 14.9 oz) (03/23 0341) HEMODYNAMICS:   VENTILATOR SETTINGS: Vent Mode:  [-] BIPAP FiO2 (%):  [50 %-100 %] 100 % Set Rate:  [8 bmp] 8 bmp PEEP:  [6 cmH20-8 cmH20] 8 cmH20 INTAKE / OUTPUT:  Intake/Output Summary (Last 24 hours) at 10/10/14 1046 Last data filed at 10/10/14 0600  Gross per 24 hour  Intake 1282.5 ml   Output   2385 ml  Net -1102.5 ml    PHYSICAL EXAMINATION: General:  Ill appearing, but stable Neuro:  Awake, oriented, follows commands, moves all extremities HEENT:  BiPAP mask in place, Rt neck wound clean Cardiovascular:  Regular, no murmur Lungs:  B/l soft crackles, mostly clear Abdomen:  Soft, non tender Musculoskeletal:  1+ edema Skin:  No rashes  LABS:  CBC  Recent Labs Lab 10/05/14 1059 10/09/14 0500 10/10/14 0400  WBC 10.8* 12.2* 9.1  HGB 12.8* 11.0* 10.3*  HCT 39.0 34.7* 32.6*  PLT 214 186 150   Coag's  Recent Labs Lab 10/05/14 1059  APTT 32  INR 1.05   BMET  Recent Labs Lab 10/05/14 1059 10/09/14 0500 10/10/14 0400  NA 136 131* 135  K 4.6 5.4* 5.6*  CL 103 101 106  CO2 21 22 24   BUN 25* 40* 36*  CREATININE 2.25* 3.16* 2.88*  GLUCOSE 157* 128* 128*   Electrolytes  Recent Labs Lab 10/05/14 1059 10/09/14 0500 10/10/14 0400  CALCIUM 9.3 8.3* 8.7   Sepsis Markers  Recent Labs Lab 10/10/14 0400  LATICACIDVEN 0.8   ABG  Recent Labs Lab 10/09/14 0835 10/09/14 1150  PHART 7.210* 7.250*  PCO2ART 52.3* 50.8*  PO2ART 81.9 70.4*   Liver Enzymes  Recent Labs Lab 10/05/14 1059  AST 15  ALT 13  ALKPHOS 72  BILITOT 0.7  ALBUMIN 3.7   Cardiac Enzymes  Recent Labs Lab 10/09/14 1150 10/09/14 1720 10/09/14 2154  TROPONINI <0.03 <0.03 0.03   Glucose  Recent Labs Lab 10/09/14 1610 10/09/14 2030 10/09/14 2334 10/10/14 0343 10/10/14 0516 10/10/14 0752  GLUCAP 112* 102* 121* 122* 120* 131*    Imaging Dg Chest Port 1 View  10/09/2014   CLINICAL DATA:  59 year old male with increased shortness of Breath. Initial encounter. Personal history of renal cell carcinoma status post right nephrectomy.  EXAM: PORTABLE CHEST - 1 VIEW  COMPARISON:  09/07/2014 and earlier.  FINDINGS: Portable AP semi upright view at 0949 hours. Lordotic view. Lower lung volumes. Increased interstitial opacity bilaterally. Mildly confluent bibasilar  opacity. No pneumothorax or definite pleural effusion.  IMPRESSION: Lower lung volumes with increased interstitial and basilar opacity in both lungs which could reflect a combination of atelectasis and pulmonary edema versus pneumonia.   Electronically Signed   By: Genevie Ann M.D.   On: 10/09/2014 10:12     ASSESSMENT / PLAN:  PULMONARY A: Acute respiratory failure with possible PNA. Hx of ILD, COPD. P:   BiPAP prn  Oxygen to keep SpO2 > 92% Scheduled duoneb Holding symbicort for now Note code status change  CARDIOVASCULAR Lt radial aline 3/21 >> A:  Hypotension. Hx of HTN, HLD, CAD S/p Rt CEA 3/21. P:  Hold cardizem, imdur. Plan restart soon.  Post op care per VVS  RENAL A:   AKI. Metabolic acidosis. Hyperkalemia. Hx of CKD, s/p Rt nephrectomy for renal cell carcinoma. P:   Repeat BMP pm 3/23, treat hyper K if persists  GASTROINTESTINAL A:   Hx of GERD. P:   NPO with sips for meds Protonix  HEMATOLOGIC A:   Leukocytosis. P:  F/u CBC SCDs  INFECTIOUS A:   Possible aspiration PNA. P:   Day 2 vancomycin, zosyn; consider d/c if infiltrates resolve quickly,  more consistent with a pneumonitis  Blood cx 3/22 >>   ENDOCRINE A:   Hx of DM type II with renal disease. P:   SSI  NEUROLOGIC A:   Acute metabolic encephalopathy, much improved 3/23 Hx of depression, anxiety. P:   Restart celexa, prozac, neurotin Monitor mental status  Transfer to SDU bed 3/23. PCCM will continue to follow with you   Baltazar Apo, MD, PhD 10/10/2014, 11:12 AM [facility] [phone] or if no answer [phone]

## 2014-10-10 NOTE — Progress Notes (Addendum)
  Progress Note    10/10/2014 7:32 AM 2 Days Post-Op  Subjective:  Doesn't remember yesterday  Afebrile HR 80's-100's NSR 62'I-948'N systolic 46% NRB  Abx:  Vanc/Zosyn  Filed Vitals:   10/10/14 0600  BP: 126/55  Pulse: 86  Temp:   Resp: 28     Physical Exam: Neuro:  In tact Incision:  C/d/i with steri strips in tact; mild fullness Lungs:  Decreased at bases Cardiac:  RRR Abdomen:  Soft, NT/ND  CBC    Component Value Date/Time   WBC 9.1 10/10/2014 0400   RBC 3.72* 10/10/2014 0400   RBC 4.29 12/29/2013 1329   HGB 10.3* 10/10/2014 0400   HCT 32.6* 10/10/2014 0400   PLT 150 10/10/2014 0400   MCV 87.6 10/10/2014 0400   MCH 27.7 10/10/2014 0400   MCHC 31.6 10/10/2014 0400   RDW 15.5 10/10/2014 0400   LYMPHSABS 0.8 02/25/2014 0354   MONOABS 0.8 02/25/2014 0354   EOSABS 0.0 02/25/2014 0354   BASOSABS 0.0 02/25/2014 0354    BMET    Component Value Date/Time   NA 135 10/10/2014 0400   K 5.6* 10/10/2014 0400   CL 106 10/10/2014 0400   CO2 24 10/10/2014 0400   GLUCOSE 128* 10/10/2014 0400   BUN 36* 10/10/2014 0400   CREATININE 2.88* 10/10/2014 0400   CREATININE 2.67* 09/26/2014 1138   CALCIUM 8.7 10/10/2014 0400   GFRNONAA 22* 10/10/2014 0400   GFRAA 26* 10/10/2014 0400     Intake/Output Summary (Last 24 hours) at 10/10/14 0732 Last data filed at 10/10/14 0600  Gross per 24 hour  Intake 1282.5 ml  Output   2385 ml  Net -1102.5 ml      Assessment/Plan:  This is a 59 y.o. male who is s/p right CEA 2 Days Post-Op  -neurologically intact. -Pt feeling better today-doesn't remember much about yesterday. -renal function improved this am but, still hyperkalemic.  Pt with good UOP -BP improved-not requiring gtts -still on NRB with O2 sat at 91%-per pulmonary -leukocytosis improved this am; blood cx x 2 pending  -glucose well controlled -should be able to advance diet today -d/c foley and a-line if okay with CCM/renal-was placed night of surgery for  urinary retention. His Flomax was increased to 0.8mg  daily.  Pt was also on Enablex and this was discontinued. -appreciate CCM and renal's help with this pt   Leontine Locket, PA-C [facility] [phone] I have examined the patient, reviewed and agree with above. Looks good this afternoon. Neurologically intact. Neck healing nicely. Appreciate pulmonary and renal consults. Stabilizing overall.  Cigi Bega, MD 10/10/2014 4:21 PM

## 2014-10-11 LAB — GLUCOSE, CAPILLARY
GLUCOSE-CAPILLARY: 131 mg/dL — AB (ref 70–99)
Glucose-Capillary: 119 mg/dL — ABNORMAL HIGH (ref 70–99)
Glucose-Capillary: 142 mg/dL — ABNORMAL HIGH (ref 70–99)
Glucose-Capillary: 148 mg/dL — ABNORMAL HIGH (ref 70–99)

## 2014-10-11 MED ORDER — LEVOFLOXACIN 750 MG PO TABS
750.0000 mg | ORAL_TABLET | ORAL | Status: DC
  Administered 2014-10-11: 750 mg via ORAL
  Filled 2014-10-11: qty 1

## 2014-10-11 NOTE — Progress Notes (Signed)
[facility] ROUNDING NOTE   Subjective:   Interval History:improved urine output  Still having low blood pressures    Objective:  Vital signs in last 24 hours:  Temp:  [98.1 F (36.7 C)-98.7 F (37.1 C)] 98.1 F (36.7 C) (03/24 0808) Pulse Rate:  [77-100] 94 (03/24 0900) Resp:  [15-23] 20 (03/24 0900) BP: (92-130)/(42-96) 92/54 mmHg (03/24 0900) SpO2:  [86 %-100 %] 92 % (03/24 0900) FiO2 (%):  [50 %-100 %] 100 % (03/24 0830)  Weight change:  Filed Weights   10/08/14 0713 10/10/14 0341  Weight: 111.585 kg (246 lb) 111.1 kg (244 lb 14.9 oz)    Intake/Output: I/O last 3 completed shifts: In: 2512.5 [P.O.:720; I.V.:815; Other:515; IV Piggyback:462.5] Out: 3975 [Urine:3975]   Intake/Output this shift:  Total I/O In: 265 [P.O.:240; IV Piggyback:25] Out: 850 [Urine:850]  CVS- RRR RS- CTA ABD- BS present soft non-distended EXT- no edema   Basic Metabolic Panel:  Recent Labs Lab 10/05/14 1059 10/09/14 0500 10/10/14 0400 10/10/14 1425  NA 136 131* 135 138  K 4.6 5.4* 5.6* 5.3*  CL 103 101 106 107  CO2 21 22 24 26   GLUCOSE 157* 128* 128* 97  BUN 25* 40* 36* 30*  CREATININE 2.25* 3.16* 2.88* 2.41*  CALCIUM 9.3 8.3* 8.7 9.2  PHOS  --   --   --  2.9    Liver Function Tests:  Recent Labs Lab 10/05/14 1059 10/10/14 1425  AST 15  --   ALT 13  --   ALKPHOS 72  --   BILITOT 0.7  --   PROT 7.5  --   ALBUMIN 3.7 3.2*   No results for input(s): LIPASE, AMYLASE in the last 168 hours. No results for input(s): AMMONIA in the last 168 hours.  CBC:  Recent Labs Lab 10/05/14 1059 10/09/14 0500 10/10/14 0400  WBC 10.8* 12.2* 9.1  HGB 12.8* 11.0* 10.3*  HCT 39.0 34.7* 32.6*  MCV 85.9 88.5 87.6  PLT 214 186 150    Cardiac Enzymes:  Recent Labs Lab 10/09/14 1150 10/09/14 1720 10/09/14 2154  TROPONINI <0.03 <0.03 0.03    BNP: Invalid input(s): POCBNP  CBG:  Recent Labs Lab 10/10/14 0516 10/10/14 0752 10/10/14 1117  10/10/14 1547 10/11/14 0036  GLUCAP 120* 131* 145* 102* 142*    Microbiology: Results for orders placed or performed during the hospital encounter of 10/08/14  Culture, blood (routine x 2)     Status: None (Preliminary result)   Collection Time: 10/09/14 11:30 AM  Result Value Ref Range Status   Specimen Description BLOOD LEFT ANTECUBITAL  Final   Special Requests BOTTLES DRAWN AEROBIC AND ANAEROBIC 10CC  Final   Culture   Final           BLOOD CULTURE RECEIVED NO GROWTH TO DATE CULTURE WILL BE HELD FOR 5 DAYS BEFORE ISSUING A FINAL NEGATIVE REPORT Performed at [facility]    Report Status PENDING  Incomplete  Culture, blood (routine x 2)     Status: None (Preliminary result)   Collection Time: 10/09/14 11:38 AM  Result Value Ref Range Status   Specimen Description BLOOD RIGHT ANTECUBITAL  Final   Special Requests BOTTLES DRAWN AEROBIC AND ANAEROBIC 10CC  Final   Culture   Final           BLOOD CULTURE RECEIVED NO GROWTH TO DATE CULTURE WILL BE HELD FOR 5 DAYS BEFORE ISSUING A FINAL NEGATIVE REPORT Performed at [facility]    Report Status  PENDING  Incomplete    Coagulation Studies: No results for input(s): LABPROT, INR in the last 72 hours.  Urinalysis:  Recent Labs  10/10/14 0840  COLORURINE YELLOW  LABSPEC 1.015  PHURINE 6.0  GLUCOSEU NEGATIVE  HGBUR MODERATE*  BILIRUBINUR NEGATIVE  KETONESUR NEGATIVE  PROTEINUR NEGATIVE  UROBILINOGEN 0.2  NITRITE NEGATIVE  LEUKOCYTESUR NEGATIVE      Imaging: US Renal  10/10/2014   CLINICAL DATA:  Acute renal failure, history of right nephrectomy for malignancy, diabetes, and hypertension  EXAM: RENAL/URINARY TRACT ULTRASOUND COMPLETE  COMPARISON:  CT scan of the abdomen of September 07, 2014  FINDINGS: Right Kidney:  The right kidney is surgically absent.  Left Kidney:  Length: 13.2 cm. The echotexture of the renal parenchyma is mildly increased. There is no focal mass or hydronephrosis.  Bladder:  The  urinary bladder is decompressed due to a Foley catheter.  IMPRESSION: 1. Increased left renal cortical echotexture is consistent with medical renal disease. There is no evidence of obstruction. 2. The right kidney is surgically absent.   Electronically Signed   By: David  [name]   On: 10/10/2014 13:40   Dg Chest Port 1 View  10/10/2014   CLINICAL DATA:  Aspiration pneumonia.  EXAM: PORTABLE CHEST - 1 VIEW  COMPARISON:  10/09/2014.  FINDINGS: Mediastinum and hilar structures stable. Stable cardiomegaly. Slight interim clearing of pulmonary interstitial prominence suggesting slight clearing of pulmonary interstitial edema. Pneumonitis cannot be excluded. Small left pleural effusion. No pneumothorax. No acute osseus abnormality .  IMPRESSION: Congestive heart failure with partial clearing of pulmonary interstitial edema. Pneumonitis cannot be excluded. Small left pleural effusion.   Electronically Signed   By: Marcello Moores  Register   On: 10/10/2014 07:20     Medications:   . sodium chloride 10 mL/hr at 10/11/14 0000   . citalopram  20 mg Oral Daily  . clopidogrel  75 mg Oral Daily  . docusate sodium  100 mg Oral Daily  . famotidine  40 mg Oral Daily  . FLUoxetine  10 mg Oral Daily  . gabapentin  300 mg Oral QHS  . insulin aspart  0-20 Units Subcutaneous TID WC  . ipratropium-albuterol  3 mL Nebulization Q6H  . piperacillin-tazobactam (ZOSYN)  IV  3.375 g Intravenous Q8H  . rosuvastatin  20 mg Oral q1800  . tamsulosin  0.8 mg Oral QPC supper  . vancomycin  1,250 mg Intravenous Q24H   acetaminophen **OR** acetaminophen, albuterol, bisacodyl, fentaNYL, hydrALAZINE, labetalol, metoprolol, nitroGLYCERIN, ondansetron, senna-docusate  Assessment/ Plan:   Acute kidney injury most likely due to ischemic ATN with remarkable drop in blood pressure and patient now with worsening respiratory failure . Hyaline casts and unilateral kidney on U/S   Hypotension fluid and pressor support appreciate VVS Consider  sepsis    Electrolytes Slight increase in K improved  Metabolic acidosis controlled  Will sign off today. Please call me back if needed  Dr Edrick Oh  (nephrology)  [phone]    LOS: 3 Simpson Paulos W @TODAY @10 :16 AM

## 2014-10-11 NOTE — Evaluation (Addendum)
Physical Therapy Evaluation Patient Details Name: [name] MRN: [id number] DOB: [birth year] Today's Date: 10/11/2014   History of Present Illness  59 yo male smoker admitted for Rt carotid endarterectomy >> done 3/21. Developed respiratory distress and acidosis 3/22  Clinical Impression  Pt pleasant and reports living with his mom for several years now as well as frequent falls. Pt with decreased cognition, balance and respiratory status who will benefit from acute therapy to address deficits in order to maximize mobility and function to decrease burden of care. Recommend daily ambulation with nursing staff. Pt states mom has her own health problems but can assist with home making and supervision.     Follow Up Recommendations Home health PT    Equipment Recommendations  Rolling walker with 5" wheels    Recommendations for Other Services OT consult     Precautions / Restrictions Precautions Precautions: Fall      Mobility  Bed Mobility Overal bed mobility: Needs Assistance Bed Mobility: Rolling;Sidelying to Sit;Sit to Sidelying Rolling: Supervision Sidelying to sit: Supervision     Sit to sidelying: Supervision General bed mobility comments: cues for positioning and safety with CEA  Transfers Overall transfer level: Needs assistance   Transfers: Sit to/from Stand Sit to Stand: Supervision         General transfer comment: cues for hand placement and safety  Ambulation/Gait Ambulation/Gait assistance: Supervision Ambulation Distance (Feet): 150 Feet Assistive device: Rolling walker (2 wheeled) Gait Pattern/deviations: Step-through pattern;Decreased stride length;Trunk flexed   Gait velocity interpretation: Below normal speed for age/gender General Gait Details: cues for posture, position in RW and breathing technique. Sats drop to 90% on 4L but on 6L able to maintain 94%  Stairs            Wheelchair Mobility    Modified Rankin (Stroke Patients  Only)       Balance Overall balance assessment: Needs assistance;History of Falls   Sitting balance-Leahy Scale: Good       Standing balance-Leahy Scale: Fair                               Pertinent Vitals/Pain Pain Assessment: No/denies pain    Home Living Family/patient expects to be discharged to:: Private residence Living Arrangements: Parent Available Help at Discharge: Family;Available 24 hours/day Type of Home: House Home Access: Ramped entrance     Home Layout: One level Home Equipment: Shower seat - built in;Bedside commode Additional Comments: mom has RW and equipment    Prior Function Level of Independence: Independent               Hand Dominance   Dominant Hand: Right    Extremity/Trunk Assessment   Upper Extremity Assessment: Overall WFL for tasks assessed           Lower Extremity Assessment: Overall WFL for tasks assessed      Cervical / Trunk Assessment: Normal  Communication   Communication: No difficulties  Cognition Arousal/Alertness: Awake/alert Behavior During Therapy: Flat affect Overall Cognitive Status: Impaired/Different from baseline Area of Impairment: Memory;Safety/judgement;Orientation Orientation Level: Time;Situation   Memory: Decreased short-term memory         General Comments: Pt with decreased awareness and memory with report of frequent falls    General Comments      Exercises        Assessment/Plan    PT Assessment Patient needs continued PT services  PT Diagnosis Difficulty walking;Altered mental status  PT Problem List Decreased cognition;Decreased balance;Decreased mobility;Decreased knowledge of use of DME  PT Treatment Interventions DME instruction;Gait training;Functional mobility training;Balance training;Therapeutic activities;Patient/family education   PT Goals (Current goals can be found in the Care Plan section) Acute Rehab PT Goals Patient Stated Goal: return home and  play cards PT Goal Formulation: With patient Time For Goal Achievement: 10/18/14 Potential to Achieve Goals: Good    Frequency Min 3X/week   Barriers to discharge Decreased caregiver support      Co-evaluation               End of Session Equipment Utilized During Treatment: Oxygen Activity Tolerance: Patient tolerated treatment well Patient left: in bed;with call bell/phone within reach Nurse Communication: Mobility status         Time: 1355-1411 PT Time Calculation (min) (ACUTE ONLY): 16 min   Charges:   PT Evaluation $Initial PT Evaluation Tier I: 1 Procedure     PT G CodesMelford Aase 10/11/2014, 2:30 PM Elwyn Reach, [place]

## 2014-10-11 NOTE — Progress Notes (Signed)
PULMONARY / CRITICAL CARE MEDICINE   Name: [name] MRN: [id number] DOB: [birth year]    ADMISSION DATE:  10/08/2014 CONSULTATION DATE:  10/09/2014  REFERRING MD : Early  CHIEF COMPLAINT:  Respiratory distress  INITIAL PRESENTATION:  59 yo male smoker admitted for Rt carotid endarterectomy >> done 3/21.  Developed respiratory distress and acidosis 3/22 and PCCM consulted.  He is followed by Dr. Chase Caller for ILD.  SIGNIFICANT EVENTS: 3/21 Rt carotid endarterectomy 3/22 To ICU  HISTORY OF PRESENT ILLNESS:   59 yo male smoker had neck surgery 3/21.  He developed respiratory distress and acidosis 3/22.  He was started on BiPAP and transfer to ICU.  He c/o chest discomfort and chest congestion.  He also has acute worsening of renal fx.  He had foley placed and nephrology was consulted.  SUBJECTIVE:  Reported some CP overnight Required BiPAP for 6 hours due some increased WOB and dyspnea.   VITAL SIGNS: Temp:  [98.1 F (36.7 C)-98.6 F (37 C)] 98.4 F (36.9 C) (03/24 1143) Pulse Rate:  [77-100] 87 (03/24 1100) Resp:  [15-23] 15 (03/24 1100) BP: (92-130)/(42-96) 112/62 mmHg (03/24 1100) SpO2:  [86 %-100 %] 97 % (03/24 1100) FiO2 (%):  [50 %-100 %] 100 % (03/24 0830) HEMODYNAMICS:   VENTILATOR SETTINGS: Vent Mode:  [-] BIPAP FiO2 (%):  [50 %-100 %] 100 % Set Rate:  [18 bmp] 18 bmp PEEP:  [8 cmH20] 8 cmH20 INTAKE / OUTPUT:  Intake/Output Summary (Last 24 hours) at 10/11/14 1230 Last data filed at 10/11/14 1100  Gross per 24 hour  Intake   1857 ml  Output   2300 ml  Net   -443 ml    PHYSICAL EXAMINATION: General:  Ill appearing, but stable Neuro:  Awake, oriented, follows commands, moves all extremities HEENT:  Rt neck wound clean Cardiovascular:  Regular, no murmur Lungs:  B/l soft crackles, mostly clear Abdomen:  Soft, non tender Musculoskeletal:  1+ edema Skin:  No rashes  LABS:  CBC  Recent Labs Lab 10/05/14 1059 10/09/14 0500 10/10/14 0400  WBC  10.8* 12.2* 9.1  HGB 12.8* 11.0* 10.3*  HCT 39.0 34.7* 32.6*  PLT 214 186 150   Coag's  Recent Labs Lab 10/05/14 1059  APTT 32  INR 1.05   BMET  Recent Labs Lab 10/09/14 0500 10/10/14 0400 10/10/14 1425  NA 131* 135 138  K 5.4* 5.6* 5.3*  CL 101 106 107  CO2 22 24 26   BUN 40* 36* 30*  CREATININE 3.16* 2.88* 2.41*  GLUCOSE 128* 128* 97   Electrolytes  Recent Labs Lab 10/09/14 0500 10/10/14 0400 10/10/14 1425  CALCIUM 8.3* 8.7 9.2  PHOS  --   --  2.9   Sepsis Markers  Recent Labs Lab 10/10/14 0400  LATICACIDVEN 0.8   ABG  Recent Labs Lab 10/09/14 0835 10/09/14 1150  PHART 7.210* 7.250*  PCO2ART 52.3* 50.8*  PO2ART 81.9 70.4*   Liver Enzymes  Recent Labs Lab 10/05/14 1059 10/10/14 1425  AST 15  --   ALT 13  --   ALKPHOS 72  --   BILITOT 0.7  --   ALBUMIN 3.7 3.2*   Cardiac Enzymes  Recent Labs Lab 10/09/14 1150 10/09/14 1720 10/09/14 2154  TROPONINI <0.03 <0.03 0.03   Glucose  Recent Labs Lab 10/10/14 0752 10/10/14 1117 10/10/14 1547 10/11/14 0036 10/11/14 0807 10/11/14 1139  GLUCAP 131* 145* 102* 142* 131* 148*    Imaging US Renal  10/10/2014   CLINICAL DATA:  Acute  renal failure, history of right nephrectomy for malignancy, diabetes, and hypertension  EXAM: RENAL/URINARY TRACT ULTRASOUND COMPLETE  COMPARISON:  CT scan of the abdomen of September 07, 2014  FINDINGS: Right Kidney:  The right kidney is surgically absent.  Left Kidney:  Length: 13.2 cm. The echotexture of the renal parenchyma is mildly increased. There is no focal mass or hydronephrosis.  Bladder:  The urinary bladder is decompressed due to a Foley catheter.  IMPRESSION: 1. Increased left renal cortical echotexture is consistent with medical renal disease. There is no evidence of obstruction. 2. The right kidney is surgically absent.   Electronically Signed   By: David  [name]   On: 10/10/2014 13:40   Dg Chest Port 1 View  10/10/2014   CLINICAL DATA:  Aspiration  pneumonia.  EXAM: PORTABLE CHEST - 1 VIEW  COMPARISON:  10/09/2014.  FINDINGS: Mediastinum and hilar structures stable. Stable cardiomegaly. Slight interim clearing of pulmonary interstitial prominence suggesting slight clearing of pulmonary interstitial edema. Pneumonitis cannot be excluded. Small left pleural effusion. No pneumothorax. No acute osseus abnormality .  IMPRESSION: Congestive heart failure with partial clearing of pulmonary interstitial edema. Pneumonitis cannot be excluded. Small left pleural effusion.   Electronically Signed   By: Marcello Moores  Register   On: 10/10/2014 07:20     ASSESSMENT / PLAN:  PULMONARY A: Acute respiratory failure with possible PNA vs pulm edema. Hx of underlying ILD, COPD. P:   BiPAP prn, continue to decrease use. He likely needs this at night chronically (was never able to secure this for home) Oxygen to keep SpO2 > 92% Scheduled duoneb [employer] symbicort for now Note code status change Abx as below  CARDIOVASCULAR Lt radial aline 3/21 >> 3/23 A:  Hypotension. Hx of HTN, HLD, CAD S/p Rt CEA 3/21. P:  Hold cardizem, imdur. Plan restart soon.  Post op care per VVS  RENAL A:   AKI. Metabolic acidosis. Hyperkalemia. Hx of CKD, s/p Rt nephrectomy for renal cell carcinoma. P:   S Cr and K are improving with auto-diuresis  GASTROINTESTINAL A:   Hx of GERD. P:   NPO with sips for meds Protonix  HEMATOLOGIC A:   Leukocytosis. P:  F/u CBC SCDs  INFECTIOUS A:   Possible aspiration PNA. P:    Change abx to levaquin to complete 7 days total (5 more days)   Blood cx 3/22 >>   ENDOCRINE A:   Hx of DM type II with renal disease. P:   SSI  NEUROLOGIC A:   Acute metabolic encephalopathy, much improved 3/23 Hx of depression, anxiety. P:   Restart celexa, prozac, neurotin Monitor mental status  Transfer to SDU bed 3/23 or 24. PCCM will continue to follow with you   Baltazar Apo, MD, PhD 10/11/2014, 12:30 PM [place]  [facility] [phone] or if no answer [phone]

## 2014-10-11 NOTE — Progress Notes (Addendum)
  Progress Note    10/11/2014 7:24 AM 3 Days Post-Op  Subjective:  Chest pain last night-resolved.    Afebrile HR  80's-90's NSR 18'A-677'J systolic 73% O2 (has NRB on, but around neck-this is with [place])  Filed Vitals:   10/11/14 0700  BP: 97/42  Pulse: 92  Temp:   Resp: 16     Physical Exam: Neuro:  In tact Incision:  C/d/i-some steri strips off  CBC    Component Value Date/Time   WBC 9.1 10/10/2014 0400   RBC 3.72* 10/10/2014 0400   RBC 4.29 12/29/2013 1329   HGB 10.3* 10/10/2014 0400   HCT 32.6* 10/10/2014 0400   PLT 150 10/10/2014 0400   MCV 87.6 10/10/2014 0400   MCH 27.7 10/10/2014 0400   MCHC 31.6 10/10/2014 0400   RDW 15.5 10/10/2014 0400   LYMPHSABS 0.8 02/25/2014 0354   MONOABS 0.8 02/25/2014 0354   EOSABS 0.0 02/25/2014 0354   BASOSABS 0.0 02/25/2014 0354    BMET    Component Value Date/Time   NA 138 10/10/2014 1425   K 5.3* 10/10/2014 1425   CL 107 10/10/2014 1425   CO2 26 10/10/2014 1425   GLUCOSE 97 10/10/2014 1425   BUN 30* 10/10/2014 1425   CREATININE 2.41* 10/10/2014 1425   CREATININE 2.67* 09/26/2014 1138   CALCIUM 9.2 10/10/2014 1425   GFRNONAA 28* 10/10/2014 1425   GFRAA 32* 10/10/2014 1425     Intake/Output Summary (Last 24 hours) at 10/11/14 0724 Last data filed at 10/11/14 0700  Gross per 24 hour  Intake   1985 ml  Output   2050 ml  Net    -65 ml      Assessment/Plan:  This is a 59 y.o. male who is s/p right CEA 3 Days Post-Op  -pt doing well from surgical standpoint-neuro exam in tact -CP last pm-resolved.  EKG NSR-hx of drug eluting stent to mid LAD and moderate to severe diffuse distal vessel dz in left Cx with preserved LV function 8/14 by Dr. Burt Knack.  Cardiac cath 6/15 revealed moderate non obstructive dz in Cx and RCA with normal LV systolic fxn-may need cardiology consult.  (hx cocaine use) -interstitial lung dz-O2 sats improving -BP improved-not requiring pressors -ABx - possible aspiration PNA; blood cx no  growth to date -creatinine had improved yesterday -he has voided since foley removed -pt needs to mobilize-states he was OOB to chair yesterday.  Increase mobilization.  Will get PT consult    Leontine Locket, PA-C [facility] [phone]  I have examined the patient, reviewed and agree with above. Neck incision healing nicely. Neurologically remains intact. Continue on antibiotics for a possible pneumonia per pulmonary. Mobilize. Home when stable from medical standpoint.  EARLY, TODD, MD 10/11/2014 9:42 AM

## 2014-10-12 DIAGNOSIS — F411 Generalized anxiety disorder: Secondary | ICD-10-CM | POA: Diagnosis present

## 2014-10-12 DIAGNOSIS — J441 Chronic obstructive pulmonary disease with (acute) exacerbation: Secondary | ICD-10-CM | POA: Diagnosis present

## 2014-10-12 DIAGNOSIS — J690 Pneumonitis due to inhalation of food and vomit: Secondary | ICD-10-CM | POA: Diagnosis present

## 2014-10-12 DIAGNOSIS — F329 Major depressive disorder, single episode, unspecified: Secondary | ICD-10-CM

## 2014-10-12 DIAGNOSIS — Z9114 Patient's other noncompliance with medication regimen: Secondary | ICD-10-CM

## 2014-10-12 DIAGNOSIS — G9341 Metabolic encephalopathy: Secondary | ICD-10-CM | POA: Diagnosis present

## 2014-10-12 DIAGNOSIS — C649 Malignant neoplasm of unspecified kidney, except renal pelvis: Secondary | ICD-10-CM | POA: Diagnosis present

## 2014-10-12 DIAGNOSIS — N183 Chronic kidney disease, stage 3 (moderate): Secondary | ICD-10-CM

## 2014-10-12 DIAGNOSIS — C641 Malignant neoplasm of right kidney, except renal pelvis: Secondary | ICD-10-CM

## 2014-10-12 DIAGNOSIS — F1910 Other psychoactive substance abuse, uncomplicated: Secondary | ICD-10-CM

## 2014-10-12 DIAGNOSIS — E1122 Type 2 diabetes mellitus with diabetic chronic kidney disease: Secondary | ICD-10-CM | POA: Diagnosis present

## 2014-10-12 DIAGNOSIS — N179 Acute kidney failure, unspecified: Secondary | ICD-10-CM | POA: Diagnosis present

## 2014-10-12 DIAGNOSIS — G4733 Obstructive sleep apnea (adult) (pediatric): Secondary | ICD-10-CM

## 2014-10-12 DIAGNOSIS — Z720 Tobacco use: Secondary | ICD-10-CM

## 2014-10-12 DIAGNOSIS — I10 Essential (primary) hypertension: Secondary | ICD-10-CM

## 2014-10-12 DIAGNOSIS — I9589 Other hypotension: Secondary | ICD-10-CM

## 2014-10-12 DIAGNOSIS — N186 End stage renal disease: Secondary | ICD-10-CM

## 2014-10-12 DIAGNOSIS — J9621 Acute and chronic respiratory failure with hypoxia: Secondary | ICD-10-CM | POA: Diagnosis present

## 2014-10-12 DIAGNOSIS — F32A Depression, unspecified: Secondary | ICD-10-CM | POA: Diagnosis present

## 2014-10-12 LAB — GLUCOSE, CAPILLARY
GLUCOSE-CAPILLARY: 109 mg/dL — AB (ref 70–99)
Glucose-Capillary: 161 mg/dL — ABNORMAL HIGH (ref 70–99)
Glucose-Capillary: 131 mg/dL — ABNORMAL HIGH (ref 70–99)
Glucose-Capillary: 134 mg/dL — ABNORMAL HIGH (ref 70–99)
Glucose-Capillary: 127 mg/dL — ABNORMAL HIGH (ref 70–99)

## 2014-10-12 LAB — BASIC METABOLIC PANEL
Anion gap: 6 (ref 5–15)
BUN: 22 mg/dL (ref 6–23)
CHLORIDE: 103 mmol/L (ref 96–112)
CO2: 25 mmol/L (ref 19–32)
CREATININE: 1.68 mg/dL — AB (ref 0.50–1.35)
Calcium: 9.4 mg/dL (ref 8.4–10.5)
GFR calc non Af Amer: 43 mL/min — ABNORMAL LOW (ref >90)
GFR, EST AFRICAN AMERICAN: 50 mL/min — AB (ref >90)
Glucose, Bld: 130 mg/dL — ABNORMAL HIGH (ref 70–99)
POTASSIUM: 4.8 mmol/L (ref 3.5–5.1)
Sodium: 134 mmol/L — ABNORMAL LOW (ref 135–145)

## 2014-10-12 MED ORDER — IPRATROPIUM-ALBUTEROL 0.5-2.5 (3) MG/3ML IN SOLN
3.0000 mL | Freq: Four times a day (QID) | RESPIRATORY_TRACT | Status: DC
  Administered 2014-10-13: 3 mL via RESPIRATORY_TRACT

## 2014-10-12 MED ORDER — LEVOFLOXACIN 750 MG PO TABS
750.0000 mg | ORAL_TABLET | ORAL | Status: DC
  Administered 2014-10-12: 750 mg via ORAL
  Filled 2014-10-12 (×2): qty 1

## 2014-10-12 NOTE — Progress Notes (Signed)
Patient does not want to wear CPAP tonight.  States he is going home tomorrow.  Dr. Janee Morn note states that while the patient probably needs CPAP at home, he does not currently have a machine.  The note also suggests continuing to decrease BIPAP use.  Patient is currently doing well, in no obvious distress, on 6L 02 (which he wears at home), sats 96%.

## 2014-10-12 NOTE — Progress Notes (Addendum)
  Progress Note    10/12/2014 7:52 AM 4 Days Post-Op  Subjective:  No complaints this morning  Afebrile HR  80's-90's NSR 741'S-239'R systolic 32% 0EB3ID  Filed Vitals:   10/12/14 0700  BP:   Pulse:   Temp: 97.9 F (36.6 C)  Resp:      Physical Exam: Neuro:  In tact Incision:  C/d/i  CBC    Component Value Date/Time   WBC 9.1 10/10/2014 0400   RBC 3.72* 10/10/2014 0400   RBC 4.29 12/29/2013 1329   HGB 10.3* 10/10/2014 0400   HCT 32.6* 10/10/2014 0400   PLT 150 10/10/2014 0400   MCV 87.6 10/10/2014 0400   MCH 27.7 10/10/2014 0400   MCHC 31.6 10/10/2014 0400   RDW 15.5 10/10/2014 0400   LYMPHSABS 0.8 02/25/2014 0354   MONOABS 0.8 02/25/2014 0354   EOSABS 0.0 02/25/2014 0354   BASOSABS 0.0 02/25/2014 0354    BMET    Component Value Date/Time   NA 134* 10/12/2014 0354   K 4.8 10/12/2014 0354   CL 103 10/12/2014 0354   CO2 25 10/12/2014 0354   GLUCOSE 130* 10/12/2014 0354   BUN 22 10/12/2014 0354   CREATININE 1.68* 10/12/2014 0354   CREATININE 2.67* 09/26/2014 1138   CALCIUM 9.4 10/12/2014 0354   GFRNONAA 43* 10/12/2014 0354   GFRAA 50* 10/12/2014 0354     Intake/Output Summary (Last 24 hours) at 10/12/14 0752 Last data filed at 10/12/14 0655  Gross per 24 hour  Intake  744.5 ml  Output   3475 ml  Net -2730.5 ml      Assessment/Plan:  This is a 59 y.o. male who is s/p right CEA 4 Days Post-Op  -pt is doing better this am. -pt neuro exam is in tact -pt has ambulated with PT -pt has voided -creatinine is much improved today at 1.68 from 2.41 a couple of days ago; K+ is also improved at 4.8  -breathing much better-he is on 6L with sats at 97% (he is on 6L O2 at home) -pt on Levaquin for possible aspiration PNA-pt is afebrile.  Blood Cx continue to be NGTD -pt may tolerate restarting Cardizem/imdur today -hopefully home soon -appreciate CCM and renal following -case management for HHPT/RW   Leontine Locket, PA-C [facility] [phone] Agree with above assessment Renal function has improved significantly Incision in neck healing nicely and neurologic exam normal  Continue to mobilize patient and hope to DC home in a.m. if blood work stable

## 2014-10-12 NOTE — Care Management Note (Unsigned)
    Page 1 of 2   10/12/2014     11:42:14 AM CARE MANAGEMENT NOTE 10/12/2014  Patient:  GAVON, MAJANO   Account Number:  [id number]  Date Initiated:  10/08/2014  Documentation initiated by:  COLE,ANGELA  Subjective/Objective Assessment:   pta from home admitted with carotid stenosis, s/p (r) carotid endartarectomy.     Action/Plan:   Return to home when medically stable. CM will f/u with d/c needs.   Anticipated DC Date:  10/16/2014   Anticipated DC Plan:  [place]  CM consult      Choice offered to / List presented to:  C-1 Patient   DME arranged  Vassie Moselle      DME agency  [place].        Status of service:  In process, will continue to follow Medicare Important Message given?  YES (If response is "NO", the following Medicare IM given date fields will be blank) Date Medicare IM given:  10/12/2014 Medicare IM given by:  Whitman Hero Date Additional Medicare IM given:   Additional Medicare IM given by:    Discharge Disposition:    Per UR Regulation:  Reviewed for med. necessity/level of care/duration of stay  If discussed at [place] of Stay Meetings, dates discussed:    Comments:  10/12/2014 @ 11:00 Whitman Hero RN,BSN,CM [phone] CM spoke with pt regarding rolling walker. Choice list given. Pt stated would like to go with [facility] services. Stated has existing relationship with them (home O2). Referral  made with [facility] ([facility]), rolling walker to be delivered to pt room. Unable to provide referral for HHPT secondary to [employer] , needing a qualifying diagnosis.    Contact:  Huynh,[name] Mother [phone]  [phone]    Jacksyn, [place] [phone]  [phone]

## 2014-10-12 NOTE — Progress Notes (Signed)
ANTIBIOTIC CONSULT NOTE - INITIAL  Pharmacy Consult for Levaquin Indication: asp pna  No Known Allergies  Patient Measurements: Height: 6' (182.9 cm) Weight: 238 lb 1.6 oz (108 kg) IBW/kg (Calculated) : 77.6  Vital Signs: Temp: 97.9 F (36.6 C) (03/25 0700) Temp Source: Oral (03/25 0700) BP: 126/63 mmHg (03/25 0725) Pulse Rate: 73 (03/25 0725) Intake/Output from previous day: 03/24 0701 - 03/25 0700 In: 744.5 [P.O.:600; I.V.:119.5; IV Piggyback:25] Out: 8413 [KGMWN:0272] Intake/Output from this shift: Total I/O In: 240 [P.O.:240] Out: 700 [Urine:700]  Labs:  Recent Labs  10/10/14 0400 10/10/14 1425 10/12/14 0354  WBC 9.1  --   --   HGB 10.3*  --   --   PLT 150  --   --   CREATININE 2.88* 2.41* 1.68*   Estimated Creatinine Clearance: 60.1 mL/min (by C-G formula based on Cr of 1.68). No results for input(s): VANCOTROUGH, VANCOPEAK, VANCORANDOM, GENTTROUGH, GENTPEAK, GENTRANDOM, TOBRATROUGH, TOBRAPEAK, TOBRARND, AMIKACINPEAK, AMIKACINTROU, AMIKACIN in the last 72 hours.   Microbiology: Recent Results (from the past 720 hour(s))  Surgical pcr screen     Status: None   Collection Time: 10/05/14 10:59 AM  Result Value Ref Range Status   MRSA, PCR NEGATIVE NEGATIVE Final   Staphylococcus aureus NEGATIVE NEGATIVE Final    Comment:        The Xpert SA Assay (FDA approved for NASAL specimens in patients over 64 years of age), is one component of a comprehensive surveillance program.  Test performance has been validated by [facility] for patients greater than or equal to 3 year old. It is not intended to diagnose infection nor to guide or monitor treatment.   Culture, blood (routine x 2)     Status: None (Preliminary result)   Collection Time: 10/09/14 11:30 AM  Result Value Ref Range Status   Specimen Description BLOOD LEFT ANTECUBITAL  Final   Special Requests BOTTLES DRAWN AEROBIC AND ANAEROBIC 10CC  Final   Culture   Final           BLOOD CULTURE  RECEIVED NO GROWTH TO DATE CULTURE WILL BE HELD FOR 5 DAYS BEFORE ISSUING A FINAL NEGATIVE REPORT Performed at [facility]    Report Status PENDING  Incomplete  Culture, blood (routine x 2)     Status: None (Preliminary result)   Collection Time: 10/09/14 11:38 AM  Result Value Ref Range Status   Specimen Description BLOOD RIGHT ANTECUBITAL  Final   Special Requests BOTTLES DRAWN AEROBIC AND ANAEROBIC 10CC  Final   Culture   Final           BLOOD CULTURE RECEIVED NO GROWTH TO DATE CULTURE WILL BE HELD FOR 5 DAYS BEFORE ISSUING A FINAL NEGATIVE REPORT Performed at [facility]    Report Status PENDING  Incomplete    Assessment: 59 y.o. male continues on Levaquin (total abx Day #3/7) for asp PNA. WBC down to nl. Afeb. Bld cx ngtd. SCr down to 1.68, est CrCl 60 ml/min.   Goal of Therapy:  Resolution of infection  Plan:  1. Levaquin 750mg  po q24h to complete 7 total days of antibiotics 2. Will f/u renal function, micro data, pt's clinical condition  Sherlon Handing, PharmD, BCPS Clinical pharmacist, pager [phone] 10/12/2014,11:29 AM

## 2014-10-12 NOTE — Progress Notes (Signed)
PULMONARY / CRITICAL CARE MEDICINE   Name: [name] MRN: [id number] DOB: 03/08/1956    ADMISSION DATE:  10/08/2014 CONSULTATION DATE:  10/09/2014  REFERRING MD : Early  CHIEF COMPLAINT:  Respiratory distress  INITIAL PRESENTATION:  59 yo male smoker admitted for Rt carotid endarterectomy >> done 3/21.  Developed respiratory distress and acidosis 3/22 and PCCM consulted.  He is followed by Dr. Chase Caller for ILD.  SIGNIFICANT EVENTS: 3/21 Rt carotid endarterectomy 3/22 To ICU  HISTORY OF PRESENT ILLNESS:   59 yo male smoker had neck surgery 3/21.  He developed respiratory distress and acidosis 3/22.  He was started on BiPAP and transfer to ICU.  He c/o chest discomfort and chest congestion.  He also has acute worsening of renal fx.  He had foley placed and nephrology was consulted.  SUBJECTIVE:  Feeling much better Pressure sore on nose from BIPAP mask   VITAL SIGNS: Temp:  [97.2 F (36.2 C)-98.4 F (36.9 C)] 97.5 F (36.4 C) (03/25 1155) Pulse Rate:  [73-93] 73 (03/25 1155) Resp:  [11-25] 14 (03/25 1155) BP: (98-135)/(39-75) 98/55 mmHg (03/25 1155) SpO2:  [93 %-100 %] 100 % (03/25 1155) Weight:  [108 kg (238 lb 1.6 oz)] 108 kg (238 lb 1.6 oz) (03/24 2210) HEMODYNAMICS:   VENTILATOR SETTINGS:   INTAKE / OUTPUT:  Intake/Output Summary (Last 24 hours) at 10/12/14 1517 Last data filed at 10/12/14 0900  Gross per 24 hour  Intake    660 ml  Output   2675 ml  Net  -2015 ml    PHYSICAL EXAMINATION: General:  Pleasant, up in chair watching TV Neuro:  Awake, oriented, follows commands, moves all extremities HEENT:  Rt neck wound clean Cardiovascular:  Regular, no murmur Lungs:  Equal breath sounds, minimal crackles, no cough on room air Abdomen:  Soft, non tender Musculoskeletal:  1+ edema Skin:  No rashes  LABS:  CBC  Recent Labs Lab 10/09/14 0500 10/10/14 0400  WBC 12.2* 9.1  HGB 11.0* 10.3*  HCT 34.7* 32.6*  PLT 186 150   Coag's No results for  input(s): APTT, INR in the last 168 hours. BMET  Recent Labs Lab 10/10/14 0400 10/10/14 1425 10/12/14 0354  NA 135 138 134*  K 5.6* 5.3* 4.8  CL 106 107 103  CO2 24 26 25   BUN 36* 30* 22  CREATININE 2.88* 2.41* 1.68*  GLUCOSE 128* 97 130*   Electrolytes  Recent Labs Lab 10/10/14 0400 10/10/14 1425 10/12/14 0354  CALCIUM 8.7 9.2 9.4  PHOS  --  2.9  --    Sepsis Markers  Recent Labs Lab 10/10/14 0400  LATICACIDVEN 0.8   ABG  Recent Labs Lab 10/09/14 0835 10/09/14 1150  PHART 7.210* 7.250*  PCO2ART 52.3* 50.8*  PO2ART 81.9 70.4*   Liver Enzymes  Recent Labs Lab 10/10/14 1425  ALBUMIN 3.2*   Cardiac Enzymes  Recent Labs Lab 10/09/14 1150 10/09/14 1720 10/09/14 2154  TROPONINI <0.03 <0.03 0.03   Glucose  Recent Labs Lab 10/11/14 0807 10/11/14 1139 10/11/14 1653 10/11/14 2000 10/12/14 0721 10/12/14 1154  GLUCAP 131* 148* 119* 161* 131* 134*    Imaging No results found.   ASSESSMENT / PLAN:  PULMONARY A: Acute respiratory failure with possible PNA vs pulm edema. Hx of underlying ILD, COPD. P:   BiPAP prn, continue to decrease use. He likely needs this at night chronically (was never able to secure this for home). Needs mask refit to avoid skin breakdown Oxygen to keep SpO2 > 92%,  now looking good on room air Scheduled duoneb Holding symbicort for now- resume at discharge Note code status change Abx as below  CARDIOVASCULAR Lt radial aline 3/21 >> 3/23 A:  Hypotension. Hx of HTN, HLD, CAD S/p Rt CEA 3/21. P:  Hold cardizem, imdur. Plan restart soon.  Post op care per [facility]  RENAL A:   AKI. Metabolic acidosis. Hyperkalemia. Hx of CKD, s/p Rt nephrectomy for renal cell carcinoma. P:   S Cr and K are improving with auto-diuresis  GASTROINTESTINAL A:   Hx of GERD. P:   NPO with sips for meds Protonix  HEMATOLOGIC A:   Leukocytosis. P:  F/u CBC SCDs  INFECTIOUS A:   Possible aspiration PNA. P:    Change  abx to levaquin to complete 7 days total (5 more days)   Blood cx 3/22 >>   ENDOCRINE A:   Hx of DM type II with renal disease. P:   SSI  NEUROLOGIC A:   Acute metabolic encephalopathy, much improved 3/23 Hx of depression, anxiety. P:   Restart celexa, prozac, neurotin Monitor mental status   PCCM will continue to follow with you   CD Annamaria Boots, MD  10/12/2014, 3:17 PM [facility]  [phone]

## 2014-10-12 NOTE — Progress Notes (Signed)
[facility] TEAM 1 - Stepdown/ICU TEAM Progress Note  [name] JHE:[id number] DOB: [birth year] DOA: 10/08/2014 PCP: [name], BETTY G, MD  Admit HPI / Brief Narrative: 59 yo WM PMHx Cocaine Abuse, Tobacco Abuse, COPD on home O2 6 L, Double vessel CAD with patent stent mid LAD (40% proximal LAD), 80-99% RICA stenosis S/P CEA, biopsy-proven desquamative interstitial pneumonitis (DIP), OSA, noncompliance with medication,Chronic kidne disease baseline Cr range had been ~ 1.4 - 1.8,Drop attacks with known LIC occlusion and high-grade right carotid stenosis S/P neck surgery 3/21. He developed respiratory distress and acidosis 3/22. He was started on BiPAP and transfer to ICU. He c/o chest discomfort and chest congestion. He also has acute worsening of renal fx. He had foley placed and nephrology was consulted.  HPI/Subjective: 3/25  A/O 4, NAD  Assessment/Plan: Acute on chronic respiratory failure/OSA/COPD exacerbation   -Most likely multifactorial to include possible PNA vs pulm edema. underlying ILD, COPD noncompliant with oxygen therapy (should be on 6 L O2 via [place] only on 5 L O2),  -Oxygen to keep SpO2 > 92% -Scheduled duoneb -CPAP -Holding symbicort for now -Will require high flow tubing and regulator prior to discharge  HTN/Hypotension. -Currently controlled with PRN medication  -Continue PRN metoprolol, hydralazine  HLD -Continue, Crestor 20 mg daily -Obtain lipid panel   CAD S/P stent placement -Continue Plavix  Acute on chronic kidney disease, s/p Rt nephrectomy for renal cell carcinoma. -baseline Cr 1.4 - 1.8, -At baseline continue to monitor closely  Possible aspiration PNA. -Continue levaquin to complete 7 days total (5 more days)   DM type II with renal disease. -Control with resistant SSI  Acute metabolic encephalopathy -Multifactorial to include noncompliance with medical care possible PNA vs pulm edema. underlying ILD, COPD  ,,  Depression/Anxiety. -Continue Celexa 20 mg daily -Continue Prozac 10 mg daily  Noncompliance with medication -Counseled patient extensively concerning for sequela of not being medically compliant to include MI, CVA, and death  Substance abuse -Counseled patient extensively concerning sequela of continuing to abuse cocaine to include death  Tobacco abuse -Counseled patient extensively on sequela of continuing to use tobacco to include death   Code Status: FULL Family Communication: no family present at time of exam Disposition Plan: Per vascular   Consultants: Dr. Mal Misty (vascular surgery Dr.Robert Agustina Caroli [facility]) Dr.Martin Justin Mend (nephrology)     Procedure/Significant Events:  S/p Rt CEA 3/21.  Culture 3/22 blood  Antibiotics: Levofloxacin 3/25>>   DVT prophylaxis:  SCD  Devices   LINES / TUBES:  Lt radial aline 3/21 >> 3/23     Continuous Infusions: . sodium chloride 10 mL/hr at 10/11/14 0000    Objective: VITAL SIGNS: Temp: 98 F (36.7 C) (03/25 1700) Temp Source: Oral (03/25 1700) BP: 112/63 mmHg (03/25 1235) Pulse Rate: 82 (03/25 1235) SPO2; FIO2:   Intake/Output Summary (Last 24 hours) at 10/12/14 1907 Last data filed at 10/12/14 1700  Gross per 24 hour  Intake    500 ml  Output   2600 ml  Net  -2100 ml     Exam: General:  A/O 4, NAD, No acute respiratory distress. Large horizontal scar coinciding with right sided CEA  Lungs: Clear to auscultation bilaterally without wheezes or crackles Cardiovascular: Regular rate and rhythm without murmur gallop or rub normal S1 and S2 Abdomen: Nontender, nondistended, soft, bowel sounds positive, no rebound, no ascites, no appreciable mass Extremities: No significant cyanosis, clubbing, or edema bilateral lower extremities  Data Reviewed: Basic  Metabolic Panel:  Recent Labs Lab 10/09/14 0500 10/10/14 0400 10/10/14 1425 10/12/14 0354  NA 131* 135 138 134*  K 5.4* 5.6* 5.3*  4.8  CL 101 106 107 103  CO2 22 24 26 25   GLUCOSE 128* 128* 97 130*  BUN 40* 36* 30* 22  CREATININE 3.16* 2.88* 2.41* 1.68*  CALCIUM 8.3* 8.7 9.2 9.4  PHOS  --   --  2.9  --    Liver Function Tests:  Recent Labs Lab 10/10/14 1425  ALBUMIN 3.2*   No results for input(s): LIPASE, AMYLASE in the last 168 hours. No results for input(s): AMMONIA in the last 168 hours. CBC:  Recent Labs Lab 10/09/14 0500 10/10/14 0400  WBC 12.2* 9.1  HGB 11.0* 10.3*  HCT 34.7* 32.6*  MCV 88.5 87.6  PLT 186 150   Cardiac Enzymes:  Recent Labs Lab 10/09/14 1150 10/09/14 1720 10/09/14 2154  TROPONINI <0.03 <0.03 0.03   BNP (last 3 results) No results for input(s): BNP in the last 8760 hours.  ProBNP (last 3 results)  Recent Labs  05/19/14 0420 05/22/14 0308 05/23/14 0300  PROBNP 598.8* 297.7* 221.3*    CBG:  Recent Labs Lab 10/11/14 1653 10/11/14 2000 10/12/14 0721 10/12/14 1154 10/12/14 1646  GLUCAP 119* 161* 131* 134* 109*    Recent Results (from the past 240 hour(s))  Surgical pcr screen     Status: None   Collection Time: 10/05/14 10:59 AM  Result Value Ref Range Status   MRSA, PCR NEGATIVE NEGATIVE Final   Staphylococcus aureus NEGATIVE NEGATIVE Final    Comment:        The Xpert SA Assay (FDA approved for NASAL specimens in patients over 29 years of age), is one component of a comprehensive surveillance program.  Test performance has been validated by [facility] for patients greater than or equal to 78 year old. It is not intended to diagnose infection nor to guide or monitor treatment.   Culture, blood (routine x 2)     Status: None (Preliminary result)   Collection Time: 10/09/14 11:30 AM  Result Value Ref Range Status   Specimen Description BLOOD LEFT ANTECUBITAL  Final   Special Requests BOTTLES DRAWN AEROBIC AND ANAEROBIC 10CC  Final   Culture   Final           BLOOD CULTURE RECEIVED NO GROWTH TO DATE CULTURE WILL BE HELD FOR 5 DAYS BEFORE  ISSUING A FINAL NEGATIVE REPORT Performed at [facility]    Report Status PENDING  Incomplete  Culture, blood (routine x 2)     Status: None (Preliminary result)   Collection Time: 10/09/14 11:38 AM  Result Value Ref Range Status   Specimen Description BLOOD RIGHT ANTECUBITAL  Final   Special Requests BOTTLES DRAWN AEROBIC AND ANAEROBIC 10CC  Final   Culture   Final           BLOOD CULTURE RECEIVED NO GROWTH TO DATE CULTURE WILL BE HELD FOR 5 DAYS BEFORE ISSUING A FINAL NEGATIVE REPORT Performed at [facility]    Report Status PENDING  Incomplete     Studies:  Recent x-ray studies have been reviewed in detail by the Attending Physician  Scheduled Meds:  Scheduled Meds: . citalopram  20 mg Oral Daily  . clopidogrel  75 mg Oral Daily  . docusate sodium  100 mg Oral Daily  . famotidine  40 mg Oral Daily  . FLUoxetine  10 mg Oral Daily  . gabapentin  300  mg Oral QHS  . insulin aspart  0-20 Units Subcutaneous TID WC  . ipratropium-albuterol  3 mL Nebulization Q6H  . levofloxacin  750 mg Oral Q24H  . rosuvastatin  20 mg Oral q1800  . tamsulosin  0.8 mg Oral QPC supper    Time spent on care of this patient: 40 mins   Henny Strauch, Geraldo Docker , MD  [facility] Office  [phone] Pager [phone]  On-Call/Text Page:      [email or web address]      password [email or web address]  If 7PM-7AM, please contact night-coverage [email or web address] Password [email or web address] 10/12/2014, 7:07 PM   LOS: 4 days   Care during the described time interval was provided by me .  I have reviewed this patient's available data, including medical history, events of note, physical examination, radiology studies and test results as part of my evaluation  Dia Crawford, MD [phone] Pager

## 2014-10-13 DIAGNOSIS — E782 Mixed hyperlipidemia: Secondary | ICD-10-CM | POA: Diagnosis present

## 2014-10-13 DIAGNOSIS — J438 Other emphysema: Secondary | ICD-10-CM

## 2014-10-13 DIAGNOSIS — N179 Acute kidney failure, unspecified: Secondary | ICD-10-CM | POA: Diagnosis present

## 2014-10-13 DIAGNOSIS — IMO0002 Reserved for concepts with insufficient information to code with codable children: Secondary | ICD-10-CM | POA: Diagnosis present

## 2014-10-13 DIAGNOSIS — E875 Hyperkalemia: Secondary | ICD-10-CM | POA: Diagnosis present

## 2014-10-13 DIAGNOSIS — N189 Chronic kidney disease, unspecified: Secondary | ICD-10-CM | POA: Diagnosis present

## 2014-10-13 DIAGNOSIS — G4733 Obstructive sleep apnea (adult) (pediatric): Secondary | ICD-10-CM | POA: Diagnosis present

## 2014-10-13 DIAGNOSIS — E1165 Type 2 diabetes mellitus with hyperglycemia: Secondary | ICD-10-CM | POA: Diagnosis present

## 2014-10-13 DIAGNOSIS — E785 Hyperlipidemia, unspecified: Secondary | ICD-10-CM

## 2014-10-13 LAB — COMPREHENSIVE METABOLIC PANEL
ALBUMIN: 3.3 g/dL — AB (ref 3.5–5.2)
ALT: 19 U/L (ref 0–53)
ANION GAP: 5 (ref 5–15)
AST: 21 U/L (ref 0–37)
Alkaline Phosphatase: 52 U/L (ref 39–117)
BILIRUBIN TOTAL: 0.3 mg/dL (ref 0.3–1.2)
BUN: 23 mg/dL (ref 6–23)
CO2: 30 mmol/L (ref 19–32)
CREATININE: 1.75 mg/dL — AB (ref 0.50–1.35)
Calcium: 9.9 mg/dL (ref 8.4–10.5)
Chloride: 101 mmol/L (ref 96–112)
GFR calc Af Amer: 47 mL/min — ABNORMAL LOW (ref >90)
GFR calc non Af Amer: 41 mL/min — ABNORMAL LOW (ref >90)
Glucose, Bld: 139 mg/dL — ABNORMAL HIGH (ref 70–99)
Potassium: 5.2 mmol/L — ABNORMAL HIGH (ref 3.5–5.1)
SODIUM: 136 mmol/L (ref 135–145)
TOTAL PROTEIN: 6.9 g/dL (ref 6.0–8.3)

## 2014-10-13 LAB — CBC WITH DIFFERENTIAL/PLATELET
BASOS PCT: 0 % (ref 0–1)
Basophils Absolute: 0 10*3/uL (ref 0.0–0.1)
EOS ABS: 0.3 10*3/uL (ref 0.0–0.7)
Eosinophils Relative: 4 % (ref 0–5)
HEMATOCRIT: 36.2 % — AB (ref 39.0–52.0)
HEMOGLOBIN: 11.7 g/dL — AB (ref 13.0–17.0)
Lymphocytes Relative: 23 % (ref 12–46)
Lymphs Abs: 1.8 10*3/uL (ref 0.7–4.0)
MCH: 28.1 pg (ref 26.0–34.0)
MCHC: 32.3 g/dL (ref 30.0–36.0)
MCV: 86.8 fL (ref 78.0–100.0)
MONO ABS: 0.7 10*3/uL (ref 0.1–1.0)
MONOS PCT: 9 % (ref 3–12)
Neutro Abs: 5.2 10*3/uL (ref 1.7–7.7)
Neutrophils Relative %: 64 % (ref 43–77)
Platelets: 214 10*3/uL (ref 150–400)
RBC: 4.17 MIL/uL — ABNORMAL LOW (ref 4.22–5.81)
RDW: 14.6 % (ref 11.5–15.5)
WBC: 8 10*3/uL (ref 4.0–10.5)

## 2014-10-13 LAB — GLUCOSE, CAPILLARY
Glucose-Capillary: 137 mg/dL — ABNORMAL HIGH (ref 70–99)
Glucose-Capillary: 134 mg/dL — ABNORMAL HIGH (ref 70–99)
Glucose-Capillary: 147 mg/dL — ABNORMAL HIGH (ref 70–99)

## 2014-10-13 LAB — POTASSIUM: Potassium: 5 mmol/L (ref 3.5–5.1)

## 2014-10-13 LAB — MAGNESIUM: Magnesium: 1.9 mg/dL (ref 1.5–2.5)

## 2014-10-13 MED ORDER — IPRATROPIUM-ALBUTEROL 0.5-2.5 (3) MG/3ML IN SOLN
3.0000 mL | Freq: Three times a day (TID) | RESPIRATORY_TRACT | Status: DC
  Filled 2014-10-13: qty 3

## 2014-10-13 MED ORDER — LEVOFLOXACIN 750 MG PO TABS: 750.0000 mg | ORAL_TABLET | ORAL | Status: DC

## 2014-10-13 MED ORDER — OXYCODONE HCL 5 MG PO TABS: 5.0000 mg | ORAL_TABLET | Freq: Four times a day (QID) | ORAL | Status: DC | PRN

## 2014-10-13 MED ORDER — SODIUM POLYSTYRENE SULFONATE 15 GM/60ML PO SUSP
15.0000 g | Freq: Once | ORAL | Status: AC
  Administered 2014-10-13: 15 g via ORAL
  Filled 2014-10-13: qty 60

## 2014-10-13 NOTE — Progress Notes (Addendum)
  Progress Note    10/13/2014 10:37 AM 5 Days Post-Op  Subjective:  No complaints-ready to go home  VSS afebrile 99% 6LO2NC (pt on 6L at home)  Filed Vitals:   10/13/14 0346  BP: 130/47  Pulse: 81  Temp: 98.2 F (36.8 C)  Resp: 17     Physical Exam: Neuro:  In tact Incision:  Healing nicely  CBC    Component Value Date/Time   WBC 8.0 10/13/2014 0224   RBC 4.17* 10/13/2014 0224   RBC 4.29 12/29/2013 1329   HGB 11.7* 10/13/2014 0224   HCT 36.2* 10/13/2014 0224   PLT 214 10/13/2014 0224   MCV 86.8 10/13/2014 0224   MCH 28.1 10/13/2014 0224   MCHC 32.3 10/13/2014 0224   RDW 14.6 10/13/2014 0224   LYMPHSABS 1.8 10/13/2014 0224   MONOABS 0.7 10/13/2014 0224   EOSABS 0.3 10/13/2014 0224   BASOSABS 0.0 10/13/2014 0224    BMET    Component Value Date/Time   NA 136 10/13/2014 0224   K 5.2* 10/13/2014 0224   CL 101 10/13/2014 0224   CO2 30 10/13/2014 0224   GLUCOSE 139* 10/13/2014 0224   BUN 23 10/13/2014 0224   CREATININE 1.75* 10/13/2014 0224   CREATININE 2.67* 09/26/2014 1138   CALCIUM 9.9 10/13/2014 0224   GFRNONAA 41* 10/13/2014 0224   GFRAA 47* 10/13/2014 0224     Intake/Output Summary (Last 24 hours) at 10/13/14 1037 Last data filed at 10/13/14 0600  Gross per 24 hour  Intake    480 ml  Output   2000 ml  Net  -1520 ml      Assessment/Plan:  This is a 59 y.o. male who is s/p right CEA 5 Days Post-Op  -pt is doing well this am. -pt neuro exam is in tact -will continue to hold BP meds per [facility] and f/u with his PCP next week.   -discharge today - will f/u with Dr. Donnetta Hutching in 7-10 days (our office will call to pt to schedule his appt).  Also, f/u with Pulmonary & Renal service per [facility] recommendations.  Appreciate their assistance.   Leontine Locket, PA-C [facility] [phone] Agree with above Neck incision on the right looks good Neurologic exam normal DC home today return to office in 2 weeks

## 2014-10-13 NOTE — Discharge Summary (Signed)
Physician Discharge Summary  [name] ATF:[id number] DOB: 08-Apr-1956 DOA: 10/08/2014  PCP: [name], BETTY G, MD  Admit date: 10/08/2014 Discharge date: 10/13/2014  Time spent: 40 minutes  Recommendations for Outpatient Follow-up:  Acute on chronic respiratory failure/OSA/COPD exacerbation  -Most likely multifactorial to include possible PNA vs pulm edema. underlying ILD, COPD noncompliant with oxygen therapy (should be on 6 L O2 via [place] only on 5 L O2), -[facility] will supply patient with high flow tubing and high flow regulator. Patient instructed to titrate Oxygen to keep SpO2 > 92% -Restart symbicort  -Follow-up with pulmonology in 1-2 weeks  HTN/Hypotension. -Patient has not required medication to control his hypertension.  -Follow-up PCP in one week; PCP to decide when/if patient restarts his BP medication  HLD -Continue, Crestor 20 mg daily -lipid panel within ADA guidelines  CAD S/P stent placement -Continue Plavix 75 mg daily  Acute on chronic kidney disease, s/p Rt nephrectomy for renal cell carcinoma. -baseline Cr 1.4 - 1.8, -At baseline PCP to continue to monitor closely  Possible aspiration PNA. -Continue levaquin to complete 7 days total (5 more days)   DM type II with renal disease. -Restart glipizide 10 mg BID  -Patient to check FSB S before each meal and before bedtime record in log book. Record all meals. Take log book to PCP, PCP to titrate medication  Acute metabolic encephalopathy -Multifactorial to include noncompliance with medical care,PNA vs pulm edema. underlying ILD, COPD -Resolved  Depression/Anxiety. -Continue Celexa 20 mg daily -Continue Prozac 10 mg daily  Noncompliance with medication -Counseled patient extensively concerning for sequela of not being medically compliant to include MI, CVA, and death  Substance abuse -Counseled patient extensively concerning sequela of continuing to abuse cocaine to include  death  Tobacco abuse -Counseled patient extensively on sequela of continuing to use tobacco to include death  Hyperkalemia -Kayexalate 30 gm 1 prior to discharge -Follow-up with PCP to monitor electrolytes closely     Discharge Diagnoses:  Active Problems:   Interstitial lung disease   COLD (chronic obstructive lung disease)   Carotid artery stenosis   Aspiration pneumonia   Acute on chronic respiratory failure with hypoxia   OSA (obstructive sleep apnea)   COPD exacerbation   Acute renal failure superimposed on stage 3 chronic kidney disease   Aspiration pneumonia due to food (regurgitated)   Renal cell carcinoma   Type 2 diabetes mellitus with end-stage renal disease   Metabolic encephalopathy   Depression   Anxiety state   Noncompliance with medication regimen   Obstructive sleep apnea   HLD (hyperlipidemia)   Acute on chronic kidney failure   Diabetes type 2, uncontrolled   Hyperkalemia   Acute on chronic renal failure   Discharge Condition: Stable   Diet recommendation: Heart healthy  Filed Weights   10/08/14 0713 10/10/14 0341 10/11/14 2210  Weight: 111.585 kg (246 lb) 111.1 kg (244 lb 14.9 oz) 108 kg (238 lb 1.6 oz)    History of present illness:  59 yo WM PMHx Cocaine Abuse, Tobacco Abuse, COPD on home O2 6 L, Double vessel CAD with patent stent mid LAD (40% proximal LAD), 80-99% RICA stenosis S/P CEA, biopsy-proven desquamative interstitial pneumonitis (DIP), OSA, noncompliance with medication,Chronic kidne disease baseline Cr range had been ~ 1.4 - 1.8,Drop attacks with known LIC occlusion and high-grade right carotid stenosis S/P neck surgery 3/21. He developed respiratory distress and acidosis 3/22. He was started on BiPAP and transfer to ICU. He c/o chest discomfort  and chest congestion. He also has acute worsening of renal fx. He had foley placed and nephrology was consulted. During hospitalization patient diagnosed with acute on chronic respiratory  failure, OSA, COPD exacerbation probably secondary to his noncompliance with medication and aspiration pneumonia. During his stay patient alsoS/p Rt CEA secondary to RIC artery 80-99% occluded.      Procedure/Significant Events: 3/16 bilateral carotid Doppler-RIC artery 29-93% occluded-LIC artery complete occlusion -Proximal RTC artery occluded in proximal segment with reconstitution in the mid segment 3/21 S/p Rt CEA; right carotid Endarterectomy with Dacron patch angioplasty   Culture 3/22 blood left/right antecubital  NGTD  Antibiotics: Levofloxacin 3/25>>   Discharge Exam: Filed Vitals:   10/12/14 2139 10/12/14 2356 10/13/14 0346 10/13/14 1157  BP:  101/59 130/47 136/68  Pulse:  76 81 81  Temp:  97.8 F (36.6 C) 98.2 F (36.8 C) 97.9 F (36.6 C)  TempSrc:  Oral Oral Oral  Resp:  20 17 16   Height:      Weight:      SpO2: 96% 94% 99% 95%    General: A/O 4, NAD, No acute respiratory distress. Large horizontal scar coinciding with right sided CEA  Lungs: Clear to auscultation bilaterally without wheezes or crackles Cardiovascular: Regular rate and rhythm without murmur gallop or rub normal S1 and S2 Abdomen: Nontender, nondistended, soft, bowel sounds positive, no rebound, no ascites, no appreciable mass Extremities: No significant cyanosis, clubbing, or edema bilateral lower extremities   Discharge Instructions      Discharge Instructions    CAROTID Sugery: Call MD for difficulty swallowing or speaking; weakness in arms or legs that is a new symtom; severe headache.  If you have increased swelling in the neck and/or  are having difficulty breathing, CALL [phone]    Complete by:  As directed      Call MD for:  redness, tenderness, or signs of infection (pain, swelling, bleeding, redness, odor or green/yellow discharge around incision site)    Complete by:  As directed      Call MD for:  severe or increased pain, loss or decreased feeling  in affected limb(s)     Complete by:  As directed      Call MD for:  temperature >100.5    Complete by:  As directed      Driving Restrictions    Complete by:  As directed   No driving for 2 weeks     Lifting restrictions    Complete by:  As directed   No lifting for 2 weeks     Resume previous diet    Complete by:  As directed      may wash over wound with mild soap and water    Complete by:  As directed   Shower daily with soap and water starting 10/10/14            Medication List    STOP taking these medications        diltiazem 240 MG 24 hr capsule  Commonly known as:  CARDIZEM CD     furosemide 40 MG tablet  Commonly known as:  LASIX     isosorbide mononitrate 60 MG 24 hr tablet  Commonly known as:  IMDUR      TAKE these medications        albuterol 108 (90 BASE) MCG/ACT inhaler  Commonly known as:  PROVENTIL HFA;VENTOLIN HFA  Inhale 2 puffs into the lungs every 6 (six) hours as needed for wheezing or shortness  of breath.     albuterol (2.5 MG/3ML) 0.083% nebulizer solution  Commonly known as:  PROVENTIL  Take 3 mLs (2.5 mg total) by nebulization every 3 (three) hours as needed for wheezing or shortness of breath.     alfuzosin 10 MG 24 hr tablet  Commonly known as:  UROXATRAL  Take 10 mg by mouth daily with breakfast.     budesonide-formoterol 160-4.5 MCG/ACT inhaler  Commonly known as:  SYMBICORT  Inhale 2 puffs into the lungs 2 (two) times daily.     citalopram 20 MG tablet  Commonly known as:  CELEXA  Take 20 mg by mouth daily.     clopidogrel 75 MG tablet  Commonly known as:  PLAVIX  Take 1 tablet (75 mg total) by mouth daily.     FISH OIL + D3 PO  Take 1 capsule by mouth 4 (four) times daily.     FLUoxetine 10 MG capsule  Commonly known as:  PROZAC  Take 10 mg by mouth every morning.     gabapentin 300 MG capsule  Commonly known as:  NEURONTIN  Take 300 mg by mouth at bedtime.     glipiZIDE 10 MG tablet  Commonly known as:  GLUCOTROL  Take 10 mg by mouth 2  (two) times daily.     ipratropium-albuterol 0.5-2.5 (3) MG/3ML Soln  Commonly known as:  DUONEB  Take 3 mLs by nebulization every 6 (six) hours as needed (Shortness of breath).     levofloxacin 750 MG tablet  Commonly known as:  LEVAQUIN  Take 1 tablet (750 mg total) by mouth daily.     nitroGLYCERIN 0.4 MG SL tablet  Commonly known as:  NITROSTAT  Place 1 tablet (0.4 mg total) under the tongue every 5 (five) minutes as needed for chest pain.     oxyCODONE 5 MG immediate release tablet  Commonly known as:  ROXICODONE  Take 1 tablet (5 mg total) by mouth every 6 (six) hours as needed.     ranitidine 300 MG tablet  Commonly known as:  ZANTAC  Take 300 mg by mouth every morning.     rosuvastatin 20 MG tablet  Commonly known as:  CRESTOR  Take 20 mg by mouth daily.     solifenacin 5 MG tablet  Commonly known as:  VESICARE  Take 5 mg by mouth daily.     tamsulosin 0.4 MG Caps capsule  Commonly known as:  FLOMAX  Take 1 capsule (0.4 mg total) by mouth daily after supper.       No Known Allergies Follow-up Information    Follow up with EARLY, TODD, MD In 2 weeks.   Specialty:  Vascular Surgery   Why:  Office will call you to arrange your appt (sent)   Contact information:   [place] [address] [phone]       Follow up with [facility].   Why:   arranged rolling walker to be delivered to room   Contact information:   [address] [place] [address] [phone]       Follow up with [name], [name], MD. Schedule an appointment as soon as possible for a visit in 1 week.   Specialty:  Family Medicine   Why:  Follow-up hospitalization acute on chronic respiratory failure, acute metabolic encephalopathy, diabetes type 2 disease   Contact information:   [address] [place] [address] [phone]  The results of significant diagnostics from this hospitalization (including imaging, microbiology,  ancillary and laboratory) are listed below for reference.    Significant Diagnostic Studies: US Renal  10/10/2014   CLINICAL DATA:  Acute renal failure, history of right nephrectomy for malignancy, diabetes, and hypertension  EXAM: RENAL/URINARY TRACT ULTRASOUND COMPLETE  COMPARISON:  CT scan of the abdomen of September 07, 2014  FINDINGS: Right Kidney:  The right kidney is surgically absent.  Left Kidney:  Length: 13.2 cm. The echotexture of the renal parenchyma is mildly increased. There is no focal mass or hydronephrosis.  Bladder:  The urinary bladder is decompressed due to a Foley catheter.  IMPRESSION: 1. Increased left renal cortical echotexture is consistent with medical renal disease. There is no evidence of obstruction. 2. The right kidney is surgically absent.   Electronically Signed   By: David  [name]   On: 10/10/2014 13:40   Dg Chest Port 1 View  10/10/2014   CLINICAL DATA:  Aspiration pneumonia.  EXAM: PORTABLE CHEST - 1 VIEW  COMPARISON:  10/09/2014.  FINDINGS: Mediastinum and hilar structures stable. Stable cardiomegaly. Slight interim clearing of pulmonary interstitial prominence suggesting slight clearing of pulmonary interstitial edema. Pneumonitis cannot be excluded. Small left pleural effusion. No pneumothorax. No acute osseus abnormality .  IMPRESSION: Congestive heart failure with partial clearing of pulmonary interstitial edema. Pneumonitis cannot be excluded. Small left pleural effusion.   Electronically Signed   By: Marcello Moores  Register   On: 10/10/2014 07:20   Dg Chest Port 1 View  10/09/2014   CLINICAL DATA:  59 year old male with increased shortness of Breath. Initial encounter. Personal history of renal cell carcinoma status post right nephrectomy.  EXAM: PORTABLE CHEST - 1 VIEW  COMPARISON:  09/07/2014 and earlier.  FINDINGS: Portable AP semi upright view at 0949 hours. Lordotic view. Lower lung volumes. Increased interstitial opacity bilaterally. Mildly confluent bibasilar  opacity. No pneumothorax or definite pleural effusion.  IMPRESSION: Lower lung volumes with increased interstitial and basilar opacity in both lungs which could reflect a combination of atelectasis and pulmonary edema versus pneumonia.   Electronically Signed   By: Genevie Ann M.D.   On: 10/09/2014 10:12    Microbiology: Recent Results (from the past 240 hour(s))  Surgical pcr screen     Status: None   Collection Time: 10/05/14 10:59 AM  Result Value Ref Range Status   MRSA, PCR NEGATIVE NEGATIVE Final   Staphylococcus aureus NEGATIVE NEGATIVE Final    Comment:        The Xpert SA Assay (FDA approved for NASAL specimens in patients over 44 years of age), is one component of a comprehensive surveillance program.  Test performance has been validated by [facility] for patients greater than or equal to 75 year old. It is not intended to diagnose infection nor to guide or monitor treatment.   Culture, blood (routine x 2)     Status: None (Preliminary result)   Collection Time: 10/09/14 11:30 AM  Result Value Ref Range Status   Specimen Description BLOOD LEFT ANTECUBITAL  Final   Special Requests BOTTLES DRAWN AEROBIC AND ANAEROBIC 10CC  Final   Culture   Final           BLOOD CULTURE RECEIVED NO GROWTH TO DATE CULTURE WILL BE HELD FOR 5 DAYS BEFORE ISSUING A FINAL NEGATIVE REPORT Performed at [facility]    Report Status PENDING  Incomplete  Culture, blood (routine x 2)     Status: None (Preliminary result)  Collection Time: 10/09/14 11:38 AM  Result Value Ref Range Status   Specimen Description BLOOD RIGHT ANTECUBITAL  Final   Special Requests BOTTLES DRAWN AEROBIC AND ANAEROBIC 10CC  Final   Culture   Final           BLOOD CULTURE RECEIVED NO GROWTH TO DATE CULTURE WILL BE HELD FOR 5 DAYS BEFORE ISSUING A FINAL NEGATIVE REPORT Performed at [facility]    Report Status PENDING  Incomplete     Labs: Basic Metabolic Panel:  Recent Labs Lab 10/09/14 0500  10/10/14 0400 10/10/14 1425 10/12/14 0354 10/13/14 0224  NA 131* 135 138 134* 136  K 5.4* 5.6* 5.3* 4.8 5.2*  CL 101 106 107 103 101  CO2 22 24 26 25 30   GLUCOSE 128* 128* 97 130* 139*  BUN 40* 36* 30* 22 23  CREATININE 3.16* 2.88* 2.41* 1.68* 1.75*  CALCIUM 8.3* 8.7 9.2 9.4 9.9  MG  --   --   --   --  1.9  PHOS  --   --  2.9  --   --    Liver Function Tests:  Recent Labs Lab 10/10/14 1425 10/13/14 0224  AST  --  21  ALT  --  19  ALKPHOS  --  52  BILITOT  --  0.3  PROT  --  6.9  ALBUMIN 3.2* 3.3*   No results for input(s): LIPASE, AMYLASE in the last 168 hours. No results for input(s): AMMONIA in the last 168 hours. CBC:  Recent Labs Lab 10/09/14 0500 10/10/14 0400 10/13/14 0224  WBC 12.2* 9.1 8.0  NEUTROABS  --   --  5.2  HGB 11.0* 10.3* 11.7*  HCT 34.7* 32.6* 36.2*  MCV 88.5 87.6 86.8  PLT 186 150 214   Cardiac Enzymes:  Recent Labs Lab 10/09/14 1150 10/09/14 1720 10/09/14 2154  TROPONINI <0.03 <0.03 0.03   BNP: BNP (last 3 results) No results for input(s): BNP in the last 8760 hours.  ProBNP (last 3 results)  Recent Labs  05/19/14 0420 05/22/14 0308 05/23/14 0300  PROBNP 598.8* 297.7* 221.3*    CBG:  Recent Labs Lab 10/12/14 1646 10/12/14 2147 10/13/14 0001 10/13/14 0818 10/13/14 1155  GLUCAP 109* 127* 137* 134* 147*       Signed:  Dia Crawford, MD [facility] [phone] pager

## 2014-10-13 NOTE — Progress Notes (Signed)
Received orders to D/C pt home. D/C instructions given to pt, pt verbalizes understanding. No s/s of acute distress noted.

## 2014-10-13 NOTE — Progress Notes (Signed)
PULMONARY / CRITICAL CARE MEDICINE   Name: [name] MRN: [id number] DOB: [birth year]    ADMISSION DATE:  10/08/2014 CONSULTATION DATE:  10/09/2014  REFERRING MD : Early  CHIEF COMPLAINT:  Respiratory distress  INITIAL PRESENTATION:  59 yo male smoker admitted for Rt carotid endarterectomy >> done 3/21.  Developed respiratory distress and acidosis 3/22 and PCCM consulted.  He is followed by Dr. Chase Caller for ILD.  SIGNIFICANT EVENTS: 3/21 Rt carotid endarterectomy 3/22 To ICU  HISTORY OF PRESENT ILLNESS:   59 yo male smoker had neck surgery 3/21.  He developed respiratory distress and acidosis 3/22.  He was started on BiPAP and transfer to ICU.  He c/o chest discomfort and chest congestion.  He also has acute worsening of renal fx.  He had foley placed and nephrology was consulted.  SUBJECTIVE:  Doing well today and wants to go home.  No increased wob.  Very little cough  VITAL SIGNS: Temp:  [97.8 F (36.6 C)-98.2 F (36.8 C)] 97.9 F (36.6 C) (03/26 1157) Pulse Rate:  [76-82] 81 (03/26 1157) Resp:  [16-20] 16 (03/26 1157) BP: (101-136)/(47-68) 136/68 mmHg (03/26 1157) SpO2:  [94 %-99 %] 95 % (03/26 1157) HEMODYNAMICS:   VENTILATOR SETTINGS:   INTAKE / OUTPUT:  Intake/Output Summary (Last 24 hours) at 10/13/14 1230 Last data filed at 10/13/14 0600  Gross per 24 hour  Intake    480 ml  Output   2000 ml  Net  -1520 ml    PHYSICAL EXAMINATION: General:  Wd male in nad Neuro:  Awake, moves all 4 HEENT:  Nose without d/c, neck without LN or TMG Cardiovascular:  Regular, no murmur Lungs:  Basilar crackles, no wheezing Abdomen:  Soft, non tender Musculoskeletal:  No significant edema or cyanosis   ASSESSMENT / PLAN:  PULMONARY A: Acute respiratory failure with possible PNA vs pulm edema. Hx of underlying ILD, COPD. P:   -Remains afebrile, and very little mucus at this time.  Would finish up 7 day course of levaquin -He is on his usual 6 liters of oxygen  as at home, with adequate sats.

## 2014-10-14 NOTE — Progress Notes (Signed)
CARE MANAGEMENT NOTE 10/14/2014  Patient:  [name], [name]   Account Number:  [id number]  Date Initiated:  10/08/2014  Documentation initiated by:  COLE,ANGELA  Subjective/Objective Assessment:   pta from home admitted with carotid stenosis, s/p (r) carotid endartarectomy.     Action/Plan:   Return to home when medically stable. CM will f/u with d/c needs.   Anticipated DC Date:  10/16/2014   Anticipated DC Plan:  [place]  CM consult      [facility] Choice  HOME HEALTH   Choice offered to / List presented to:  C-1 Patient   DME arranged  Vassie Moselle      DME agency  [place] arranged  [place].   Status of service:  Completed, signed off Medicare Important Message given?  YES (If response is "NO", the following Medicare IM given date fields will be blank) Date Medicare IM given:  10/12/2014 Medicare IM given by:  Whitman Hero Date Additional Medicare IM given:   Additional Medicare IM given by:    Discharge Disposition:  [place]  Per UR Regulation:  Reviewed for med. necessity/level of care/duration of stay  If discussed at [place] of Stay Meetings, dates discussed:    Comments:  3/26/16CM called [facility] DME rep to deliver a high flow meter to room for pt's portable tank: pt using 6L flow.  No other CM needs were communicated.  Mariane Masters, BSN, [place] [phone].   10/12/2014 @ 11:00 Whitman Hero RN,BSN,CM [phone] CM spoke with pt regarding rolling walker. Choice list given. Pt stated would like to go with [facility] services. Stated has existing relationship with them (home O2). Referral  made with [facility] ([facility]), rolling walker to be delivered to pt room. Unable to provide referral for HHPT secondary to [employer] , needing a qualifying diagnosis.    Contact:  Birnie,[name] Mother [phone]  [phone]     [place], [place] [phone]  [phone]

## 2014-10-15 LAB — CULTURE, BLOOD (ROUTINE X 2)
Culture: NO GROWTH
Culture: NO GROWTH

## 2014-10-22 ENCOUNTER — Encounter: Payer: Self-pay | Admitting: Vascular Surgery

## 2014-10-23 ENCOUNTER — Ambulatory Visit (INDEPENDENT_AMBULATORY_CARE_PROVIDER_SITE_OTHER): Payer: Self-pay | Admitting: Vascular Surgery

## 2014-10-23 ENCOUNTER — Encounter: Payer: Self-pay | Admitting: Vascular Surgery

## 2014-10-23 VITALS — BP 85/47 | HR 71 | Temp 97.6°F | Ht 72.0 in | Wt 241.8 lb

## 2014-10-23 DIAGNOSIS — Z48812 Encounter for surgical aftercare following surgery on the circulatory system: Secondary | ICD-10-CM

## 2014-10-23 DIAGNOSIS — I6523 Occlusion and stenosis of bilateral carotid arteries: Secondary | ICD-10-CM

## 2014-10-23 NOTE — Addendum Note (Signed)
Addended by: Dorthula Rue L on: 10/23/2014 01:41 PM   Modules accepted: Orders

## 2014-10-23 NOTE — Progress Notes (Signed)
Here today for follow-up of carotid endarterectomy on 10/08/2014. He been seen with drop attacks. Known left internal carotid artery occlusion and high-grade right carotid stenosis. He underwent the uneventful endarterectomy on 10/08/2014. He had a very atypical postoperative course. Had respiratory difficulties. Did have baseline renal insufficiency and had worsening of his renal function as well. He eventually responded without any other interventions and recovery went home.  Today he reports of more than the usual amount of incisional soreness. He reported that the oxycodone made him sick to the stomach. He is requesting hydrocodone for pain.  Incision looks quite good. Is healing. He has no carotid bruits and is grossly intact neurologically  Impression and plan stable following the right carotid endarterectomy on 10/08/2014. Explained that he is having more than typical amount of soreness but this will resolve. Was given a prescription for hydrocodone 11/20/2023 #30 no refills  Will notify should he develop any discomfort is. Otherwise will be seen in 6 months with P carotid duplex

## 2014-10-24 ENCOUNTER — Encounter: Payer: Self-pay | Admitting: Neurology

## 2014-10-24 ENCOUNTER — Ambulatory Visit (INDEPENDENT_AMBULATORY_CARE_PROVIDER_SITE_OTHER): Payer: Medicaid Other | Admitting: Neurology

## 2014-10-24 VITALS — BP 82/50 | HR 79 | Ht 71.5 in | Wt 240.0 lb

## 2014-10-24 DIAGNOSIS — Z8673 Personal history of transient ischemic attack (TIA), and cerebral infarction without residual deficits: Secondary | ICD-10-CM

## 2014-10-24 DIAGNOSIS — R42 Dizziness and giddiness: Secondary | ICD-10-CM

## 2014-10-24 DIAGNOSIS — I951 Orthostatic hypotension: Secondary | ICD-10-CM

## 2014-10-24 DIAGNOSIS — I739 Peripheral vascular disease, unspecified: Secondary | ICD-10-CM

## 2014-10-24 DIAGNOSIS — I779 Disorder of arteries and arterioles, unspecified: Secondary | ICD-10-CM

## 2014-10-24 NOTE — Progress Notes (Signed)
NEUROLOGY FOLLOW UP OFFICE NOTE  Kevin DETTMER [id number]  HISTORY OF PRESENT ILLNESS: I had the pleasure of seeing [name] in follow-up in the neurology clinic on 60/12/2014.  The patient was last seen a months ago for dizziness associated with transient vision loss on the left. Records and images were personally reviewed where available.  I personally reviewed MRI brain without contrast which did not show any acute infarct. There was encephalomalacia in the right inferior frontal lobe and insular cortex consistent with old right MCA infarct. MRA head had shown distal left ICA absent and presumed occluded, otherwise normal intracranial MRA. Cervical MRI had showed moderate-sized central herniated nucleus pulposus C5-C6 encroaching on the ventral spinal cord without cord signal abnormality. Right foraminal and right ventral canal stenosis at C6-C7 with potential encroachment on the C7 nerve root uncovertebral disc osteophyte complex.  He had carotid dopplers done which showed heterogeneous plaque bilaterally, occluded left ICA, 80-99% right ICA stenosis. He was switched from aspirin to Plavix. He was kindly seen urgently by Vascular Surgery and underwent right carotid CEA on 10/08/14. His hospital course was complicated by respiratory failure, acute on chronic kidney disease, and hypotension. Since hospital discharge, he continues to have low BP readings. His BP yesterday with Vascular surgery visit was 85/47. Today it is 82/50. He reports a dizzy episode 2 days ago where he fell, no loss of consciousness or injury. His BP after the fall was noted to be 80/40. His BP medications have been stopped except he reports that he still takes Lasix 40mg  daily. He denies any episodes of loss of vision with this. He slept the whole day after. His neck incision is sore, otherwise he denies any focal numbness/tingling/weakness, diplopia, dysarthria/dysphagia. No headaches.   HPI: This is a very pleasant 59 yo RH  man with a history of hypertension, hyperlipidemia, diabetes, interstitial lung disease, chronic respiratory failure, sleep apnea, tobacco use, CAD s/p stent, renal CA s/p nephrectomy, with episodes of dizziness associated with transient left vision loss. He started having symptoms at the beginning of January 2016, he reported dizziness with left vision loss to Dr. Chase Caller at the end of January. At that time, it was noted that the dizziness was orthostatic, with SBP in the 90s (his usual baseline is 177 systolic). He describes the dizziness as a floating sensation where his legs would start wobbling and give out, his whole body becomes shaky and he may fall to the ground with no loss of consciousness. He has fallen 2-3 times in the past 2 months, last fall was a week ago. Associated with the dizziness is transient vision loss on the left eye where he cannot see objects in front on him, only seeing a white light. Symptoms occur only when he is coughing, bending over, or changing positions from sitting to standing. He would lie down and symptoms resolve in an hour. Symptoms do not occur when supine. He occasionally has headaches associated with these, with pain on the right of vertex, radiating down to his neck and shoulder blades. He denies any eye pain, no focal numbness/tingling/weakness. He has horizontal diplopia 1-2 times a week. He lives alone but now has had to move in with his mother since the symptoms started. He has noticed that his memory has changed as well in the past month or so, he forgets what he was going to do. He does not drive. He denies any dysphagia but has noticed more effort is needed to chew. He denies  any bowel/bladder dysfunction. He had some chest pain this morning and took Nitro.   PAST MEDICAL HISTORY: Past Medical History  Diagnosis Date  . Essential hypertension, benign   . TIA (transient ischemic attack) 1997    No residual neurological deficits.  . Noncompliance   . COPD  (chronic obstructive pulmonary disease)   . Hyperlipidemia   . History of pneumonia   . Type 2 diabetes mellitus 2011  . GERD (gastroesophageal reflux disease)   . Substance abuse   . Cocaine abuse     02-14-14 discusses freely with medical team, prefers no discussions with family present.  . Cholecystitis, acute 12/20/2013    Lap chole 6/5  . Coronary atherosclerosis of native coronary artery     a. 03/09/2013 Cath/PCI: LM nl, LAD: 50p, 17m (2.5x16 promus DES), LCX nl, OM1 min irregs, LPL/LPDA diff dzs, RCA nondom, mod diff dzs, EF 55%.  . Depression   . Anxiety   . History of nephrolithiasis   . Arthritis   . Hepatitis Late 1970s  . Anginal pain     last tx last week.  . Stroke     lft sided weakness  . Sleep apnea     On CPAP, 4L O2 no cpap at home yet  . Shortness of breath dyspnea   . Pneumonia     6/15  . Renal cell carcinoma     Status post radical rt nephrectomy August 2015  . Headache     MEDICATIONS: Current Outpatient Prescriptions on File Prior to Visit  Medication Sig Dispense Refill  . albuterol (PROVENTIL HFA;VENTOLIN HFA) 108 (90 BASE) MCG/ACT inhaler Inhale 2 puffs into the lungs every 6 (six) hours as needed for wheezing or shortness of breath.    [name] albuterol (PROVENTIL) (2.5 MG/3ML) 0.083% nebulizer solution Take 3 mLs (2.5 mg total) by nebulization every 3 (three) hours as needed for wheezing or shortness of breath. 75 mL 12  . alfuzosin (UROXATRAL) 10 MG 24 hr tablet Take 10 mg by mouth daily with breakfast.    . budesonide-formoterol (SYMBICORT) 160-4.5 MCG/ACT inhaler Inhale 2 puffs into the lungs 2 (two) times daily. 1 Inhaler 3  . citalopram (CELEXA) 20 MG tablet Take 20 mg by mouth daily.    . clopidogrel (PLAVIX) 75 MG tablet Take 1 tablet (75 mg total) by mouth daily. 30 tablet 11  . Fish Oil-Cholecalciferol (FISH OIL + D3 PO) Take 1 capsule by mouth 4 (four) times daily.     [name] FLUoxetine (PROZAC) 10 MG capsule Take 10 mg by mouth every morning.    .  gabapentin (NEURONTIN) 300 MG capsule Take 300 mg by mouth at bedtime.    [name] glipiZIDE (GLUCOTROL) 10 MG tablet Take 10 mg by mouth 2 (two) times daily.    [name] ipratropium-albuterol (DUONEB) 0.5-2.5 (3) MG/3ML SOLN Take 3 mLs by nebulization every 6 (six) hours as needed (Shortness of breath).    . ranitidine (ZANTAC) 300 MG tablet Take 300 mg by mouth every morning.     . rosuvastatin (CRESTOR) 20 MG tablet Take 20 mg by mouth daily.    . solifenacin (VESICARE) 5 MG tablet Take 5 mg by mouth daily.    . tamsulosin (FLOMAX) 0.4 MG CAPS capsule Take 1 capsule (0.4 mg total) by mouth daily after supper. 30 capsule 1  . nitroGLYCERIN (NITROSTAT) 0.4 MG SL tablet Place 1 tablet (0.4 mg total) under the tongue every 5 (five) minutes as needed for chest pain. (Patient not taking: Reported  on 10/24/2014) 30 tablet 1  Lasix 40mg  daily No current facility-administered medications on file prior to visit.    ALLERGIES: No Known Allergies  FAMILY HISTORY: Family History  Problem Relation Age of Onset  . Hypertension    . Diabetes    . Stroke    . Lung cancer Father   . Cancer Mother     Thyroid - living in her 21's.  . Cancer Maternal Grandmother     Breast  . Cancer Maternal Grandfather     Throat and stomach  . CAD Father   . CAD Brother     SOCIAL HISTORY: History   Social History  . Marital Status: Single    Spouse Name: N/A  . Number of Children: N/A  . Years of Education: N/A   Occupational History  . N/A    Social History Main Topics  . Smoking status: Current Every Day Smoker -- 0.50 packs/day for 41 years    Types: Cigarettes    Start date: 05/03/1972  . Smokeless tobacco: Never Used     Comment: smokes 1/2 pack daily  . Alcohol Use: No  . Drug Use: No     Comment: last used cocaine 11/2013. but is cocaine + this admit July 2015  . Sexual Activity: No   Other Topics Concern  . Not on file   Social History Narrative   Lives in [place] with his mother.  He does not  work.    REVIEW OF SYSTEMS: Constitutional: No fevers, chills, or sweats, no generalized fatigue, change in appetite Eyes: No visual changes, double vision, eye pain Ear, nose and throat: No hearing loss, ear pain, nasal congestion, sore throat Cardiovascular: No chest pain, palpitations Respiratory:  No shortness of breath at rest or with exertion, wheezes GastrointestinaI: No nausea, vomiting, diarrhea, abdominal pain, fecal incontinence Genitourinary:  No dysuria, urinary retention or frequency Musculoskeletal:  + neck incision pain, no back pain Integumentary: No rash, pruritus, skin lesions Neurological: as above Psychiatric: No depression, insomnia, anxiety Endocrine: No palpitations, fatigue, diaphoresis, mood swings, change in appetite, change in weight, increased thirst Hematologic/Lymphatic:  No anemia, purpura, petechiae. Allergic/Immunologic: no itchy/runny eyes, nasal congestion, recent allergic reactions, rashes  PHYSICAL EXAM: Filed Vitals:   10/24/14 0858  BP: 82/50  Pulse: 79   General: No acute distress Head:  Normocephalic/atraumatic Neck: supple, no paraspinal tenderness, full range of motion, right neck incision healing well Heart:  Regular rate and rhythm Lungs:  Clear to auscultation bilaterally Back: No paraspinal tenderness Skin/Extremities: No rash, no edema Neurological Exam: alert and oriented to person, place, and time, no dysarthria or aphasia, Fund of knowledge is appropriate. Recent and remote memory are intact. Attention and concentration are normal. Able to name objects and repeat phrases. Cranial nerves: CN I: not tested CN II: pupils equal, round and reactive to light, visual fields intact, fundi unremarkable. CN III, IV, VI: full range of motion, no nystagmus, no ptosis CN V: decreased light touch on left CN VII: upper and lower face symmetric CN VIII: hearing intact to finger rub CN IX, X: gag intact, uvula midline CN XI:  sternocleidomastoid and trapezius muscles intact CN XII: tongue midline Bulk & Tone: normal, no fasciculations. Motor: 5/5 throughout with no pronator drift but note of decreased fine finger movements on the left hand (similar to prior) Sensation: decreased light touch on left. Romberg test positive sway Deep Tendon Reflexes: asymmetric reflexes on the upper extremities: +1 on right UE, brisk +3 on left  UE with +Hoffman sign on left. +2 on bilateral patella, absent ankle jerks bilaterally (similar to prior). no ankle clonus Plantar responses: mute on left, downgoing on right Cerebellar: mild ataxia on left finger to nose (similar to prior) Gait: slow and cautious, unable to tandem but no ataxia noted Tremor: none  IMPRESSION: This is a pleasant 59 yo RH man with vascular risk factors including hypertension, hyperlipidemia, diabetes, tobacco use, CAD s/p stent, with interstitial lung disease, chronic respiratory failure, sleep apnea, renal CA s/p nephrectomy, who presented for episodes of dizziness associated with transient left vision loss. His exam had shown left-sided deficits, MRI brain did not show any acute infarct. There was an old right MCA infarct (cause of left-sided symptoms). He was found to have significant bilateral carotid disease, occluded on the left and 80-99% stenosis on the right. He underwent right CEA, hospital course complicated by acute on chronic renal insufficiency and respiratory failure. He now feels better but is still noted to be hypotensive. We discussed dizziness is likely orthostatic, they tend to occur with bending down or coughing. No further vision changes. His BP today is again low, he was instructed to call his physicians to make them aware of Lasix intake (not on his medication list initially). Continue Plavix, statin, stop tobacco use. He knows to go to the ER if symptoms change. He will follow-up in 6 months and knows to call our office for any problems in the  interim.  Thank you for allowing me to participate in his care.  Please do not hesitate to call for any questions or concerns.  The duration of this appointment visit was 15 minutes of face-to-face time with the patient.  Greater than 50% of this time was spent in counseling, explanation of diagnosis, planning of further management, and coordination of care.   Ellouise Newer, M.D.   CC: Dr. [name]

## 2014-10-24 NOTE — Patient Instructions (Addendum)
1. Discuss your Lasix intake with your nephrologist and PCP, it may still be affecting your BP 2. Continue to monitor BP 3. Go to ER immediately for change in symptoms 4. Follow-up in 6 months

## 2014-10-25 ENCOUNTER — Telehealth: Payer: Self-pay | Admitting: Internal Medicine

## 2014-10-25 DIAGNOSIS — J9611 Chronic respiratory failure with hypoxia: Secondary | ICD-10-CM

## 2014-10-25 DIAGNOSIS — J849 Interstitial pulmonary disease, unspecified: Secondary | ICD-10-CM

## 2014-10-25 NOTE — Telephone Encounter (Signed)
That is opk. Please let him know that if he is getting worse, then at fu I can discuss lung transplant option  . Just fyi

## 2014-10-25 NOTE — Telephone Encounter (Signed)
I called spoke with pt. He reports when he last saw TP she states he can use 4-6 liters O2 24/7. Pt reports he is now using 6 liters only 24/7. He wants an order sent to [facility] stating he needs 6 liters O2. Please advise MR if okay? thanks

## 2014-10-25 NOTE — Telephone Encounter (Signed)
Pt aware and order placed. Nothing further needed 

## 2014-11-12 ENCOUNTER — Encounter: Payer: Self-pay | Admitting: Internal Medicine

## 2014-11-12 ENCOUNTER — Ambulatory Visit (INDEPENDENT_AMBULATORY_CARE_PROVIDER_SITE_OTHER): Payer: Medicaid Other | Admitting: Internal Medicine

## 2014-11-12 VITALS — BP 124/60 | HR 74 | Ht 71.5 in | Wt 245.8 lb

## 2014-11-12 DIAGNOSIS — F17200 Nicotine dependence, unspecified, uncomplicated: Secondary | ICD-10-CM

## 2014-11-12 DIAGNOSIS — J84117 Desquamative interstitial pneumonia: Secondary | ICD-10-CM | POA: Diagnosis not present

## 2014-11-12 DIAGNOSIS — R0789 Other chest pain: Secondary | ICD-10-CM

## 2014-11-12 DIAGNOSIS — J9611 Chronic respiratory failure with hypoxia: Secondary | ICD-10-CM | POA: Diagnosis not present

## 2014-11-12 DIAGNOSIS — Z720 Tobacco use: Secondary | ICD-10-CM

## 2014-11-12 DIAGNOSIS — J849 Interstitial pulmonary disease, unspecified: Secondary | ICD-10-CM | POA: Diagnosis not present

## 2014-11-12 NOTE — Patient Instructions (Addendum)
ICD-9-CM ICD-10-CM   1. Chronic respiratory failure with hypoxia 518.83 J96.11 AMB referral to rehabilitation   799.02  Ambulatory referral to Cardiology  2. Interstitial lung disease 515 J84.9 AMB referral to rehabilitation     Ambulatory referral to Cardiology  3. 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 Smoking 305.1 Z72.0   5. Chest tightness 786.59 R07.89 AMB referral to rehabilitation     Ambulatory referral to Cardiology    - You have interstitial lung disease due to smoking; specific disease is called DIP - Your shortness of breath is due to ILD and significant deconditioning (loss of fitness) - we discussed prednisone but will hold off due to side effect profile and lack of potential benefit - most important treatment is to quit smoking - REfer Pulmonary rehab - refer cardiology - rule out angina equvilaent -= Lose weight - use oxygen but buy a pulse oximeter - not sure you need 6L; keep pusle ox > 88%   #smoking  - You absolutely need to quit smoking as soon as possible  #Chest tightness  - keep up appt with Dr Domenic Polite - rule out angina    # follow-up  - 3 months with spirometry and walk test at followup

## 2014-11-12 NOTE — Progress Notes (Signed)
Subjective:    Patient ID: [name], male    DOB: 09-10-1955, 59 y.o.   MRN: [id number]  HPI  OV 11/12/2014  Chief Complaint  Patient presents with  . Follow-up    Pt stated his breathing is unchanged since last OV. Pt had a right carotid endartectomy. Pt has not yet been started on CPAP. Pt dry cough, dizziness when coughing, chest heaviness without O2. Pt hospitalized late March for aspiration pna.       Subjective:    Patient ID: [name], male    DOB: [birth year], 59 y.o.   MRN: [id number]      OV 08/16/2014   Chief Complaint  Patient presents with  . Follow-up    Pt c/o increasd DOE for the past month. He states that he gets out of breath walking to his mailbox and back and last wk had episode of CP when he walked to his mailbox.  His cough is unchanged. He is using albuterol inhaler 3-4 x daily on average and neb maybe once per wk.     Follow-up interstitial lung disease and chronic respiratory failure in the setting of sleep apnea and continued smoking. 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 He did follow-up with my nurse practitioner and was seemed to be improving. This is his first visit after the biopsy to see me. He continues to use his oxygen at 4-6 L out of subjective needs. He has not had any kind of formal assessment whether he needs his oxygen anymore. He reports ongoing shortness of breath class III levels of activity with exertion and relieved by rest. In addition for the last month is reporting dizziness particular orthostatic that is associated with blood pressure systolic in the 26Z [baseline he says is 124 systolic]based on self measurement. The dizziness episodes associated with transient vision loss in the left eye. He has not seen  his primary care physician for these symptoms in association with these dizziness symptoms his dyspnea seems to be worse   Review of his medications show that he is on multiple antihypertensive medications   Wlak test 185 feet x on ROOM AIR (I stopped his 4L o2 for 7 minutes) - did NOT DESATURATE but got dyspneic     reports that he has been smoking Cigarettes.  He started smoking about 42 years ago. He has a 20.5 pack-year smoking history. He has never used smokeless tobacco.     Follow up : ILD  Patient returns for a follow-up Patient underwent a PFT on February 19 showed an FEV1 at 90%, ratio 81, FVC 83%, no significant bronchodilator response, diffusing capacity decreased at 50%. No significant change in PFT except for diffusing capacity. Previously was at 55%. CT chest shows a very subtle upper lobe . peribronchovascular groundglass consistent with biopsy-proven DIP, mild emphysema. Has cut back on smoking. Discussed smoking cessation.  Does complains of persistent cough for last month.  Coughs so hard he get sick and weak. Feels lightheaded if he coughs very hard. Cough is now productive . No hemoptysis, chest pain, orthopnea, PND, or increased leg swelling Patient is on ACE inhibitor. Over-the-counter cough medicines or not working    OV 11/12/2014  Chief Complaint  Patient presents with  . Follow-up    Pt stated his  breathing is unchanged since last OV. Pt had a right carotid endartectomy. Pt has not yet been started on CPAP. Pt dry cough, dizziness when coughing, chest heaviness without O2. Pt hospitalized late March for aspiration pna.     Follow-up interstitial lung disease due to DIP with chronic respiratory failure Follow-up smoking   He continues to smoke. Since his last visit overall his respirator status is stable. He continues to have class III dyspnea and exertion relieved by rest and by oxygen. He says that he walks 50 feet he gets dyspneic. Sedentary  lifestyle at home is able to manage without oxygen. He feels that he needs 6 L of oxygen to relieve the shortness of breath. There is associated cough and chest tightness. He says that he is never had a cardiac stress test or is unsure. Chart shows he has appt with Dr Domenic Polite 11/29/14 and he is s/P Cardiac stent on plavix. Also has never been to pulmonary rehabilitation. Walking desaturation test 185 feet 3 laps on room air [he was on room for 8 minutes before walking],  He continues to be obese. He is finding it tough to quit smoking    Past, Family, Social reviewed: Last visit with me he reported transient vision issues and dizziness. I referred him to neurology this then resulted in him having a right carotid endarterectomy. He was then hospitalized 10/08/2014 through 10/13/2014. Course was comp again when non-oliguric acute renal failure, respiratory failure and pneumonia. He was discharged on 6 L oxygen. He was also sent home on Levaquin. Last chest x-ray 12/10/2058 shows improvement.    Review of Systems  Constitutional: Negative for fever and unexpected weight change.  HENT: Negative for congestion, dental problem, ear pain, nosebleeds, postnasal drip, rhinorrhea, sinus pressure, sneezing, sore throat and trouble swallowing.   Eyes: Negative for redness and itching.  Respiratory: Positive for cough, chest tightness and shortness of breath. Negative for wheezing.   Cardiovascular: Negative for palpitations and leg swelling.  Gastrointestinal: Negative for nausea and vomiting.  Genitourinary: Negative for dysuria.  Musculoskeletal: Negative for joint swelling.  Skin: Negative for rash.  Neurological: Positive for dizziness. Negative for headaches.  Hematological: Does not bruise/bleed easily.  Psychiatric/Behavioral: Negative for dysphoric mood. The patient is not nervous/anxious.        Objective:   Physical Exam  Constitutional: He is oriented to person, place, and time. He appears  well-developed and well-nourished. No distress.  HENT:  Head: Normocephalic and atraumatic.  Right Ear: External ear normal.  Left Ear: External ear normal.  Mouth/Throat: Oropharynx is clear and moist. No oropharyngeal exudate.  Long right neck scar from CEA. Looks healthy  Eyes: Conjunctivae and EOM are normal. Pupils are equal, round, and reactive to light. Right eye exhibits no discharge. Left eye exhibits no discharge. No scleral icterus.  Neck: Normal range of motion. Neck supple. No JVD present. No tracheal deviation present. No thyromegaly present.  Cardiovascular: Normal rate, regular rhythm and intact distal pulses.  Exam reveals no gallop and no friction rub.   No murmur heard. Pulmonary/Chest: Effort normal and breath sounds normal. No respiratory distress. He has no wheezes. He has no rales. He exhibits no tenderness.  Visceral obesity + Occ crackles+  Abdominal: Soft. Bowel sounds are normal. He exhibits no distension and no mass. There is no tenderness. There is no rebound and no guarding.  Musculoskeletal: Normal range of motion. He exhibits no edema or tenderness.  Lymphadenopathy:    He has no cervical adenopathy.  Neurological: He is alert and oriented to person, place, and time. He has normal reflexes. No cranial nerve deficit. Coordination normal.  Skin: Skin is warm and dry. No rash noted. He is not diaphoretic. No erythema. No pallor.  Psychiatric: He has a normal mood and affect. His behavior is normal. Judgment and thought content normal.  Nursing note and vitals reviewed.   Filed Vitals:   11/12/14 1423  BP: 124/60  Pulse: 74  Height: 5' 11.5" (1.816 m)  Weight: 245 lb 12.8 oz (111.494 kg)  SpO2: 96%        Assessment & Plan:     ICD-9-CM ICD-10-CM   1. Chronic respiratory failure with hypoxia 518.83 J96.11 AMB referral to rehabilitation   799.02  Ambulatory referral to Cardiology  2. Interstitial lung disease 515 J84.9 AMB referral to rehabilitation       Ambulatory referral to Cardiology  3. 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 Smoking 305.1 Z72.0   5. Chest tightness 786.59 R07.89 AMB referral to rehabilitation     Ambulatory referral to Cardiology    - You have interstitial lung disease due to smoking; specific disease is called DIP - Your shortness of breath is due to ILD and significant deconditioning (loss of fitness) - we discussed prednisone but will hold off due to side effect profile and lack of potential benefit - most important treatment is to quit smoking - REfer Pulmonary rehab - refer cardiology - rule out angina equvilaent -= Lose weight - use oxygen but buy a pulse oximeter - not sure you need 6L; keep pusle ox > 88%   #smoking  - You absolutely need to quit smoking as soon as possible  #Chest tightness  - keep up appt with Dr Domenic Polite - rule out angina    # follow-up  - 3 months with spirometry and walk test at followup   Dr. Brand Males, M.D., [facility].C.P Pulmonary and Critical Care Medicine Staff Physician [facility] Pager: [phone], If no answer or between  15:00h - 7:00h: call [phone]  11/12/2014 2:50 PM

## 2014-11-22 ENCOUNTER — Telehealth (HOSPITAL_COMMUNITY): Payer: Self-pay

## 2014-11-22 NOTE — Telephone Encounter (Signed)
Called patient to discuss Pulmonary Rehab.  Patient has medicaid so he was offered our maintenance program.  Patient states that he is not able to afford the out of pocket program.  Patient was encouraged to contact us in the future if his situation changes.

## 2014-11-28 ENCOUNTER — Other Ambulatory Visit: Payer: Self-pay | Admitting: Adult Health

## 2014-11-28 NOTE — Telephone Encounter (Signed)
Received faxed refill request from [facility] for Diovan 80mg  Rx was initiated at 2.26.16 ov with TP when Lisinopril was stopped Per pt's chart, the Diovan was d/c'd at the 3.15.16 ov with Dr. Domenic Polite w/ cardiology  Refusal sent to pharmacy Diovan has already been removed from med list  Will sign off

## 2014-11-29 ENCOUNTER — Encounter: Payer: Self-pay | Admitting: Cardiology

## 2014-11-29 ENCOUNTER — Ambulatory Visit (INDEPENDENT_AMBULATORY_CARE_PROVIDER_SITE_OTHER): Payer: Medicaid Other | Admitting: Cardiology

## 2014-11-29 VITALS — BP 94/58 | HR 69 | Ht 72.0 in | Wt 247.0 lb

## 2014-11-29 DIAGNOSIS — I25119 Atherosclerotic heart disease of native coronary artery with unspecified angina pectoris: Secondary | ICD-10-CM | POA: Diagnosis not present

## 2014-11-29 DIAGNOSIS — R42 Dizziness and giddiness: Secondary | ICD-10-CM | POA: Diagnosis not present

## 2014-11-29 DIAGNOSIS — E782 Mixed hyperlipidemia: Secondary | ICD-10-CM

## 2014-11-29 MED ORDER — VALSARTAN 80 MG PO TABS: 40.0000 mg | ORAL_TABLET | Freq: Every day | ORAL | Status: DC

## 2014-11-29 NOTE — Patient Instructions (Signed)
Your physician has recommended you make the following change in your medication:  Decrease diovan to 40 mg daily. Please break your 80 mg tablets in half daily. If your top blood pressure number is greater than 100 stay on the 40 mg diovan. If your top blood pressure number goes below 100, stop taking the 40 mg diovan. Continue all other medications the same. Your physician has requested that you regularly monitor and record your blood pressure readings at home. Please use the same machine at the same time of day to check your readings and record them to bring to your follow-up visit. Your physician recommends that you schedule a follow-up appointment in: 4 months. You will receive a reminder letter in the mail in about 2 months reminding you to call and schedule your appointment. If you don't receive this letter, please contact our office.

## 2014-11-29 NOTE — Progress Notes (Signed)
Cardiology Office Note  Date: 11/29/2014   ID: Ova, Meegan Mar 05, 1956, MRN [id number]  PCP: [name], BETTY G, MD  Primary Cardiologist: [name], MD   Chief Complaint  Patient presents with  . Coronary Artery Disease    History of Present Illness: FABION GATSON is a medically complex 59 y.o. male last seen in March. At that time he was having problems with orthostatic dizziness and transient left visual changes, undergoing neurology evaluation. I had him stop Diovan at that point, and he also followed up with carotid Dopplers. He was noted to have greater than 80% or ICA stenosis with an occluded LICA, was seen by Dr. Oneida Alar, and underwent right carotid endarterectomy in March.  He was last seen by Dr. Chase Caller in April for pulmonary follow-up. I reviewed the note.  He comes in today for a routine visit. He does not report any accelerating angina symptoms on current medical regimen. Remains chronically short of breath, on continuous oxygen. He still describes orthostatic dizziness, blood pressure is on the low side today. Since I saw him he was placed back on Diovan.  Most recent cardiac testing is reviewed below.   Past Medical History  Diagnosis Date  . Essential hypertension, benign   . History of stroke     Right MCA distribution, residual left-sided weakness  . Noncompliance   . COPD (chronic obstructive pulmonary disease)   . Hyperlipidemia   . History of pneumonia   . Type 2 diabetes mellitus 2011  . GERD (gastroesophageal reflux disease)   . Cocaine abuse   . Cholecystitis, acute 12/20/2013    Lap chole 6/5  . Coronary atherosclerosis of native coronary artery     a. 03/09/2013 Cath/PCI: LM nl, LAD: 50p, 59m (2.5x16 promus DES), LCX nl, OM1 min irregs, LPL/LPDA diff dzs, RCA nondom, mod diff dzs, EF 55%.  . Depression   . Anxiety   . History of nephrolithiasis   . Arthritis   . Hepatitis Late 1970s  . Sleep apnea     On CPAP, 4L O2 no cpap at  home yet  . Renal cell carcinoma     Status post radical right nephrectomy August 2015  . Headache     Past Surgical History  Procedure Laterality Date  . Appendectomy  1970's  . Cholecystectomy N/A 12/22/2013    Procedure: LAPAROSCOPIC CHOLECYSTECTOMY ;  Surgeon: [name], MD;  Location: [facility];  Service: General;  Laterality: N/A;  . Robot assisted laparoscopic nephrectomy Right 02/21/2014    Procedure: ROBOTIC ASSISTED LAPAROSCOPIC RIGHT NEPHRECTOMY ;  Surgeon: [name], MD;  Location: [facility];  Service: Urology;  Laterality: Right;  . Laparoscopic lysis of adhesions  02/21/2014    Procedure: LAPAROSCOPIC LYSIS OF ADHESIONS EXTINSIVE;  Surgeon: [name], MD;  Location: [facility];  Service: Urology;;  . Video assisted thoracoscopy Left 05/18/2014    Procedure: LEFT VIDEO ASSISTED THORACOSCOPY;  Surgeon: [name], MD;  Location: [place];  Service: Thoracic;  Laterality: Left;  . Lung biopsy Left 05/18/2014    Procedure: LUNG BIOPSY left upper lobe & left lower lobe;  Surgeon: [name], MD;  Location: [place];  Service: Thoracic;  Laterality: Left;  . Video bronchoscopy  05/18/2014    Procedure: VIDEO BRONCHOSCOPY;  Surgeon: [name], MD;  Location: [place];  Service: Thoracic;;  . Left heart catheterization with coronary angiogram N/A 06/02/2012    Procedure: LEFT HEART CATHETERIZATION WITH CORONARY ANGIOGRAM;  Surgeon:  [name], MD;  Location[facility] CATH LAB;  Service: Cardiovascular;  Laterality: N/A;  . Left heart catheterization with coronary angiogram N/A 03/09/2013    Procedure: LEFT HEART CATHETERIZATION WITH CORONARY ANGIOGRAM;  Surgeon: [name], MD;  Location: [facility] CATH LAB;  Service: Cardiovascular;  Laterality: N/A;  . Left heart catheterization with coronary angiogram N/A 12/20/2013    Procedure: LEFT HEART CATHETERIZATION WITH CORONARY ANGIOGRAM;  Surgeon: [name], MD;  Location: [facility] CATH LAB;  Service:  Cardiovascular;  Laterality: N/A;  . Endarterectomy Right 10/08/2014    Procedure: Right ENDARTERECTOMY CAROTID;  Surgeon: [name], MD;  Location: [place];  Service: Vascular;  Laterality: Right;  . Patch angioplasty Right 10/08/2014    Procedure: PATCH ANGIOPLASTY Right Carotid;  Surgeon: [name], MD;  Location[facility] OR;  Service: Vascular;  Laterality: Right;    Current Outpatient Prescriptions  Medication Sig Dispense Refill  . albuterol (PROVENTIL HFA;VENTOLIN HFA) 108 (90 BASE) MCG/ACT inhaler Inhale 2 puffs into the lungs every 6 (six) hours as needed for wheezing or shortness of breath.    [name] albuterol (PROVENTIL) (2.5 MG/3ML) 0.083% nebulizer solution Take 3 mLs (2.5 mg total) by nebulization every 3 (three) hours as needed for wheezing or shortness of breath. 75 mL 12  . alfuzosin (UROXATRAL) 10 MG 24 hr tablet Take 10 mg by mouth daily with breakfast.    . ALPRAZolam (XANAX) 0.25 MG tablet Take 0.25 mg by mouth 2 (two) times daily as needed for anxiety.    [name] atorvastatin (LIPITOR) 20 MG tablet Take 20 mg by mouth daily.    . budesonide-formoterol (SYMBICORT) 160-4.5 MCG/ACT inhaler Inhale 2 puffs into the lungs 2 (two) times daily. 1 Inhaler 3  . citalopram (CELEXA) 20 MG tablet Take 40 mg by mouth daily.     . clopidogrel (PLAVIX) 75 MG tablet Take 1 tablet (75 mg total) by mouth daily. 30 tablet 11  . diltiazem (CARDIZEM CD) 240 MG 24 hr capsule Take 240 mg by mouth daily.    . Fish Oil-Cholecalciferol (FISH OIL + D3 PO) Take 1 capsule by mouth 4 (four) times daily.     [name] FLUoxetine (PROZAC) 10 MG capsule Take 10 mg by mouth every morning.    . furosemide (LASIX) 40 MG tablet Take 40 mg by mouth daily.    [name] gabapentin (NEURONTIN) 300 MG capsule Take 300 mg by mouth at bedtime.    [name] glipiZIDE (GLUCOTROL) 10 MG tablet Take 10 mg by mouth 2 (two) times daily.    [name] ipratropium-albuterol (DUONEB) 0.5-2.5 (3) MG/3ML SOLN Take 3 mLs by nebulization every 6 (six) hours as needed  (Shortness of breath).    . isosorbide mononitrate (IMDUR) 60 MG 24 hr tablet Take 60 mg by mouth daily.    . nitroGLYCERIN (NITROSTAT) 0.4 MG SL tablet Place 1 tablet (0.4 mg total) under the tongue every 5 (five) minutes as needed for chest pain. 30 tablet 1  . ranitidine (ZANTAC) 300 MG tablet Take 300 mg by mouth every morning.     . solifenacin (VESICARE) 5 MG tablet Take 5 mg by mouth daily.    . tamsulosin (FLOMAX) 0.4 MG CAPS capsule Take 1 capsule (0.4 mg total) by mouth daily after supper. 30 capsule 1  . valsartan (DIOVAN) 80 MG tablet Take 0.5 tablets (40 mg total) by mouth daily. 15 tablet 4   No current facility-administered medications for this visit.    Allergies:  Review of patient's allergies indicates  no known allergies.   Social History: The patient  reports that he has been smoking Cigarettes.  He started smoking about 42 years ago. He has a 20.5 pack-year smoking history. He has never used smokeless tobacco. He reports that he does not drink alcohol or use illicit drugs.   ROS:  Please see the history of present illness. Otherwise, complete review of systems is positive for chronic shortness of breath, left-sided weakness as before.  All other systems are reviewed and negative.   Physical Exam: VS:  BP 94/58 mmHg  Pulse 69  Ht 6' (1.829 m)  Wt 247 lb (112.038 kg)  BMI 33.49 kg/m2  SpO2 93%, BMI Body mass index is 33.49 kg/(m^2).  Wt Readings from Last 3 Encounters:  11/29/14 247 lb (112.038 kg)  11/12/14 245 lb 12.8 oz (111.494 kg)  10/24/14 240 lb (108.863 kg)     Overweight..Appears comfortable at rest. Oxygen via nasal cannula. HEENT: Conjunctiva and lids normal, oropharynx clear.  Neck: Supple, no elevated JVP, bilateral carotid bruits, no thyromegaly.  Lungs: Diminished but clear to auscultation, nonlabored breathing at rest.  Cardiac: Regular rate and rhythm, no S3, 2/6 systolic murmur, no pericardial rub.  Abdomen: Soft, nontender, bowel sounds  present, no guarding or rebound.  Extremities: No pitting edema, distal pulses 2+.  Skin: Warm and dry.  Musculoskeletal: No kyphosis.  Neuropsychiatric: Alert and oriented x3, affect grossly appropriate.   ECG: ECG is not ordered today.   Recent Labwork: 12/27/2013: TSH 0.309* 05/23/2014: Pro B Natriuretic peptide (BNP) 221.3* 10/13/2014: ALT 19; AST 21; BUN 23; Creatinine 1.75*; Hemoglobin 11.7*; Magnesium 1.9; Platelets 214; Potassium 5.0; Sodium 136     Component Value Date/Time   CHOL 128 12/19/2013 0220   TRIG 152* 12/19/2013 0220   HDL 31* 12/19/2013 0220   CHOLHDL 4.1 12/19/2013 0220   VLDL 30 12/19/2013 0220   LDLCALC 67 12/19/2013 0220    Other Studies Reviewed Today:  1. Cardiac catheterization 12/20/2013: Hemodynamic Findings: Central aortic pressure: 120/62 Left ventricular pressure: 127/7/15  Angiographic Findings:  Left main: Short segment. No obstructive disease.   Left Anterior Descending Artery: Moderate caliber vessel that courses to the apex. There is 40% proximal stenosis. The mid stented segment is patent without restenosis. The distal vessel has diffuse non-obstructive plaque. The diagonal branches are small in caliber with no obstructive disease.   Circumflex Artery: Large dominant system with large bifurcating first obtuse marginal branch and three small caliber posterolateral branches. The OM branch has diffuse 50% proximal stenosis, no focally obstructive lesions. The three small caliber posterolateral branches all have diffuse moderate to severe stenosis that is unchanged from last cath.   Right Coronary Artery: Small non-dominant vessel with 40% mid vessel stenosis.   Left Ventricular Angiogram: LVEF=65%  Impression: 1. Double vessel CAD with patent stent mid LAD 2. Moderate non-obstructive disease in the Circumflex and RCA 3. Normal LV systolic function   2. Echocardiogram 02/25/2014: Study Conclusions  - Left ventricle: The cavity  size was normal. Wall thickness was increased in a pattern of mild LVH. Systolic function was vigorous. The estimated ejection fraction was in the range of 65% to 70%. Wall motion was normal; there were no regional wall motion abnormalities. Doppler parameters are consistent with abnormal left ventricular relaxation (grade 1 diastolic dysfunction). The E/e&' ratio is between 8-15, suggesting indeterminate LV filling pressure. - Left atrium: The atrium was normal in size.  Impressions:  - Compared to the prior echo in 2013, there are no  signficant changes.   Assessment and Plan:  1. Orthostatic dizziness, blood pressure on the low side today. I have recommended cutting Diovan back to 40 mg daily, check blood pressures at home. If his systolic blood pressure is not consistently over 100, would stop Diovan altogether.  2. Ischemic heart disease with stable angina symptoms. Most recent cardiac catheterization from this past June is noted above demonstrating patent mid LAD stent site with otherwise moderate nonobstructive disease in the circumflex and RCA. He continues on Plavix, Crestor, Imdur, and Cardizem CD (no beta blocker with lung disease).  3. Hyperlipidemia, on statin therapy. Last LDL 67.  Current medicines were reviewed with the patient today.  Disposition: FU with me in 4 months.   Signed, Satira Sark, MD, [facility] 11/29/2014 9:32 AM    [facility] [place], [place], [place] [address] Phone: [phone]; Fax: [phone]

## 2014-12-18 ENCOUNTER — Other Ambulatory Visit: Payer: Self-pay

## 2014-12-18 MED ORDER — NITROGLYCERIN 0.4 MG SL SUBL: 0.4000 mg | SUBLINGUAL_TABLET | SUBLINGUAL | Status: DC | PRN

## 2014-12-18 NOTE — Telephone Encounter (Signed)
Refill complete 

## 2015-01-31 ENCOUNTER — Encounter: Payer: Self-pay | Admitting: *Deleted

## 2015-02-01 ENCOUNTER — Other Ambulatory Visit: Payer: Self-pay

## 2015-02-01 MED ORDER — ISOSORBIDE MONONITRATE ER 60 MG PO TB24: 60.0000 mg | ORAL_TABLET | Freq: Every day | ORAL | Status: DC

## 2015-02-14 ENCOUNTER — Other Ambulatory Visit (INDEPENDENT_AMBULATORY_CARE_PROVIDER_SITE_OTHER): Payer: Medicaid Other

## 2015-02-14 ENCOUNTER — Encounter: Payer: Self-pay | Admitting: Internal Medicine

## 2015-02-14 ENCOUNTER — Ambulatory Visit (INDEPENDENT_AMBULATORY_CARE_PROVIDER_SITE_OTHER): Payer: Medicaid Other | Admitting: Internal Medicine

## 2015-02-14 VITALS — BP 110/64 | HR 88 | Ht 72.0 in | Wt 257.0 lb

## 2015-02-14 DIAGNOSIS — J84117 Desquamative interstitial pneumonia: Secondary | ICD-10-CM | POA: Diagnosis not present

## 2015-02-14 DIAGNOSIS — J849 Interstitial pulmonary disease, unspecified: Secondary | ICD-10-CM

## 2015-02-14 DIAGNOSIS — Z720 Tobacco use: Secondary | ICD-10-CM

## 2015-02-14 DIAGNOSIS — R5381 Other malaise: Secondary | ICD-10-CM | POA: Insufficient documentation

## 2015-02-14 DIAGNOSIS — J9611 Chronic respiratory failure with hypoxia: Secondary | ICD-10-CM

## 2015-02-14 DIAGNOSIS — F17200 Nicotine dependence, unspecified, uncomplicated: Secondary | ICD-10-CM

## 2015-02-14 DIAGNOSIS — M79661 Pain in right lower leg: Secondary | ICD-10-CM | POA: Diagnosis not present

## 2015-02-14 DIAGNOSIS — R5383 Other fatigue: Secondary | ICD-10-CM

## 2015-02-14 LAB — BASIC METABOLIC PANEL
BUN: 19 mg/dL (ref 6–23)
CALCIUM: 9.4 mg/dL (ref 8.4–10.5)
CHLORIDE: 98 meq/L (ref 96–112)
CO2: 25 mEq/L (ref 19–32)
Creatinine, Ser: 1.62 mg/dL — ABNORMAL HIGH (ref 0.40–1.50)
GFR: 46.49 mL/min — AB (ref >60.00)
GLUCOSE: 137 mg/dL — AB (ref 70–99)
POTASSIUM: 4 meq/L (ref 3.5–5.1)
Sodium: 133 mEq/L — ABNORMAL LOW (ref 135–145)

## 2015-02-14 LAB — TROPONIN I: TNIDX: 0.01 ug/l (ref 0.00–0.06)

## 2015-02-14 LAB — CBC
HCT: 39.9 % (ref 39.0–52.0)
Hemoglobin: 13.5 g/dL (ref 13.0–17.0)
MCHC: 33.8 g/dL (ref 30.0–36.0)
MCV: 83.9 fl (ref 78.0–100.0)
Platelets: 194 10*3/uL (ref 150.0–400.0)
RBC: 4.76 Mil/uL (ref 4.22–5.81)
RDW: 14.5 % (ref 11.5–15.5)
WBC: 10.9 10*3/uL — AB (ref 4.0–10.5)

## 2015-02-14 NOTE — Progress Notes (Signed)
Subjective:    Patient ID: [name], male    DOB: 1955/12/09, 59 y.o.   MRN: [id number]  HPI     OV 08/16/2014   Chief Complaint  Patient presents with  . Follow-up    Pt c/o increasd DOE for the past month. He states that he gets out of breath walking to his mailbox and back and last wk had episode of CP when he walked to his mailbox.  His cough is unchanged. He is using albuterol inhaler 3-4 x daily on average and neb maybe once per wk.     Follow-up interstitial lung disease and chronic respiratory failure in the setting of sleep apnea and continued smoking. 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 He did follow-up with my nurse practitioner and was seemed to be improving. This is his first visit after the biopsy to see me. He continues to use his oxygen at 4-6 L out of subjective needs. He has not had any kind of formal assessment whether he needs his oxygen anymore. He reports ongoing shortness of breath class III levels of activity with exertion and relieved by rest. In addition for the last month is reporting dizziness particular orthostatic that is associated with blood pressure systolic in the 16B [baseline he says is 846 systolic]based on self measurement. The dizziness episodes associated with transient vision loss in the left eye. He has not seen his primary care physician for these symptoms in association with these dizziness symptoms his dyspnea seems to be worse   Review of his medications show that he is on multiple antihypertensive medications   [place] test 185 feet x on ROOM AIR (I stopped his 4L o2 for 7 minutes) - did NOT DESATURATE but got dyspneic     reports that he has been smoking Cigarettes.  He started smoking about 42 years ago. He has a  20.5 pack-year smoking history. He has never used smokeless tobacco.  OV 11/12/2014  Chief Complaint  Patient presents with  . Follow-up    Pt stated his breathing is unchanged since last OV. Pt had a right carotid endartectomy. Pt has not yet been started on CPAP. Pt dry cough, dizziness when coughing, chest heaviness without O2. Pt hospitalized late March for aspiration pna.     Follow-up interstitial lung disease due to DIP with chronic respiratory failure Follow-up smoking   He continues to smoke. Since his last visit overall his respirator status is stable. He continues to have class III dyspnea and exertion relieved by rest and by oxygen. He says that he walks 50 feet he gets dyspneic. Sedentary lifestyle at home is able to manage without oxygen. He feels that he needs 6 L of oxygen to relieve the shortness of breath. There is associated cough and chest tightness. He says that he is never had a cardiac stress test or is unsure. Chart shows he has appt with Dr Domenic Polite 11/29/14 and he is s/P Cardiac stent on plavix. Also has never been to pulmonary rehabilitation. Walking desaturation test 185 feet 3 laps on room air [he was on room for 8 minutes before walking],  He continues to be obese. He is finding it tough to quit smoking.     Past, Family, Social reviewed: Last visit with me he reported transient vision  issues and dizziness. I referred him to neurology this then resulted in him having a right carotid endarterectomy. He was then hospitalized 10/08/2014 through 10/13/2014. Course was comp again when non-oliguric acute renal failure, respiratory failure and pneumonia. He was discharged on 6 L oxygen. He was also sent home on Levaquin. Last chest x-ray 12/10/2058 shows improvement.     OV 02/14/2015  Chief Complaint  Patient presents with  . Follow-up    Pt here for 3 month ROV for ILD. Pt states his breathing has worsened since last OV. Pt c/o non prod cough and left lower leg  pain. Pt states he has gained 7 lbs in 8 days. Pt still has to schedule CPAP titration ordered by [facility].     SMoking:  reports that he has been smoking Cigarettes.  He started smoking about 42 years ago. He has a 20.5 pack-year smoking history. He has never used smokeless tobacco.    ILD/DIP: Routine follow-up interstitial lung disease secondary to DIP. He continues to smoke at he says he has cut down to 2 cigarettes per day. He says in the last 3 months he's had progressive decline in dyspnea slowly and steadily without any abrupt worsening. Spirometry today shows FVC 2.64 L/72%, FEV1 2.7 L/69% ratio 75/97% - significant decline from 4L FVC n Feb 2016. He says that he continues to use 4-6 liters of oxygen with his indigent system at home. But when he coughs he desaturated to 80s at home and then has cough related near syncope and sometimes has fallen down. He says overall he is miserable. Pulse ox on RA at rest - off o2 x 10 minuts- it did drop to 87% but with exertion it actually improved to 99%. He stopped after 20-30 feet because of fatigue and dyspnea but his pulse ox was in the high 90s.  New problem: His right calf swelling with some tightness and difficulty weightbearing for the last 2 weeks. His primary care physician [name], [name], MD  Is apparently aware of this. Apparently had a duplex ultrasound to rule out deep vein thrombosis but he has not heard back. I called her and spoke to her - she says results still pending which means likely negative. Says recent lab work including renal function normal. Says patient has lot of anxiety and is also opioid dependent. She is unsure    Overall both problems is resulting in him feeling "miserable" but is no fever, chills, paroxysmal nocturnal dyspnea, orthopnea   Current outpatient prescriptions:  .  albuterol (PROVENTIL HFA;VENTOLIN HFA) 108 (90 BASE) MCG/ACT inhaler, Inhale 2 puffs into the lungs every 6 (six) hours as needed for wheezing or  shortness of breath., Disp: , Rfl:  .  albuterol (PROVENTIL) (2.5 MG/3ML) 0.083% nebulizer solution, Take 3 mLs (2.5 mg total) by nebulization every 3 (three) hours as needed for wheezing or shortness of breath., Disp: 75 mL, Rfl: 12 .  alfuzosin (UROXATRAL) 10 MG 24 hr tablet, Take 10 mg by mouth daily with breakfast., Disp: , Rfl:  .  ALPRAZolam (XANAX) 0.25 MG tablet, Take 0.25 mg by mouth 2 (two) times daily as needed for anxiety., Disp: , Rfl:  .  atorvastatin (LIPITOR) 20 MG tablet, Take 20 mg by mouth daily., Disp: , Rfl:  .  budesonide-formoterol (SYMBICORT) 160-4.5 MCG/ACT inhaler, Inhale 2 puffs into the lungs 2 (two) times daily., Disp: 1 Inhaler, Rfl: 3 .  citalopram (CELEXA) 20 MG tablet, Take 40 mg by mouth daily. , Disp: , Rfl:  .  clopidogrel (PLAVIX) 75 MG tablet, Take 1 tablet (75 mg total) by mouth daily., Disp: 30 tablet, Rfl: 11 .  diltiazem (CARDIZEM CD) 240 MG 24 hr capsule, Take 240 mg by mouth daily., Disp: , Rfl:  .  Fish Oil-Cholecalciferol (FISH OIL + D3 PO), Take 1 capsule by mouth 4 (four) times daily. , Disp: , Rfl:  .  FLUoxetine (PROZAC) 10 MG capsule, Take 10 mg by mouth every morning., Disp: , Rfl:  .  furosemide (LASIX) 40 MG tablet, Take 40 mg by mouth daily., Disp: , Rfl:  .  gabapentin (NEURONTIN) 300 MG capsule, Take 300 mg by mouth at bedtime., Disp: , Rfl:  .  glipiZIDE (GLUCOTROL) 10 MG tablet, Take 10 mg by mouth 2 (two) times daily., Disp: , Rfl:  .  ipratropium-albuterol (DUONEB) 0.5-2.5 (3) MG/3ML SOLN, Take 3 mLs by nebulization every 6 (six) hours as needed (Shortness of breath)., Disp: , Rfl:  .  isosorbide mononitrate (IMDUR) 60 MG 24 hr tablet, Take 1 tablet (60 mg total) by mouth daily., Disp: 90 tablet, Rfl: 3 .  nitroGLYCERIN (NITROSTAT) 0.4 MG SL tablet, Place 1 tablet (0.4 mg total) under the tongue every 5 (five) minutes as needed for chest pain., Disp: 25 tablet, Rfl: 3 .  omeprazole (PRILOSEC) 40 MG capsule, Take 40 mg by mouth daily.,  Disp: , Rfl:  .  ranitidine (ZANTAC) 300 MG tablet, Take 300 mg by mouth every morning. , Disp: , Rfl:  .  solifenacin (VESICARE) 5 MG tablet, Take 5 mg by mouth daily., Disp: , Rfl:  .  tamsulosin (FLOMAX) 0.4 MG CAPS capsule, Take 1 capsule (0.4 mg total) by mouth daily after supper., Disp: 30 capsule, Rfl: 1 .  valsartan (DIOVAN) 80 MG tablet, Take 0.5 tablets (40 mg total) by mouth daily. (Patient taking differently: Take 20 mg by mouth daily. ), Disp: 15 tablet, Rfl: 4   Review of Systems  Constitutional: Negative for fever and unexpected weight change.  HENT: Negative for congestion, dental problem, ear pain, nosebleeds, postnasal drip, rhinorrhea, sinus pressure, sneezing, sore throat and trouble swallowing.   Eyes: Negative for redness and itching.  Respiratory: Positive for cough, chest tightness and shortness of breath. Negative for wheezing.   Cardiovascular: Positive for leg swelling. Negative for palpitations.  Gastrointestinal: Negative for nausea and vomiting.  Genitourinary: Negative for dysuria.  Musculoskeletal: Negative for joint swelling.  Skin: Negative for rash.  Neurological: Negative for headaches.  Hematological: Does not bruise/bleed easily.  Psychiatric/Behavioral: Negative for dysphoric mood. The patient is not nervous/anxious.        Objective:   Physical Exam  Constitutional: He is oriented to person, place, and time. He appears well-developed and well-nourished. No distress.  Looks more decondtioned by a bit  HENT:  Head: Normocephalic and atraumatic.  Right Ear: External ear normal.  Left Ear: External ear normal.  Mouth/Throat: Oropharynx is clear and moist. No oropharyngeal exudate.  Eyes: Conjunctivae and EOM are normal. Pupils are equal, round, and reactive to light. Right eye exhibits no discharge. Left eye exhibits no discharge. No scleral icterus.  Neck: Normal range of motion. Neck supple. No JVD present. No tracheal deviation present. No  thyromegaly present.  Cardiovascular: Normal rate, regular rhythm and intact distal pulses.  Exam reveals no gallop and no friction rub.   No murmur heard. Pulmonary/Chest: Effort normal. No respiratory distress. He has no wheezes. He has rales. He exhibits no tenderness.  Rales a base, No resp distress  Abdominal: Soft.  Bowel sounds are normal. He exhibits no distension and no mass. There is no tenderness. There is no rebound and no guarding.  Musculoskeletal: Normal range of motion. He exhibits no edema or tenderness.  HAS A WALKER WITH HIM  Lymphadenopathy:    He has no cervical adenopathy.  Neurological: He is alert and oriented to person, place, and time. He has normal reflexes. No cranial nerve deficit. Coordination normal.  Skin: Skin is warm and dry. No rash noted. He is not diaphoretic. No erythema. No pallor.  Psychiatric: Judgment and thought content normal.  Seems to feel more miserable per report  Nursing note and vitals reviewed.   Filed Vitals:   02/14/15 1108  BP: 110/64  Pulse: 88  Height: 6' (1.829 m)  Weight: 257 lb (116.574 kg)  SpO2: 97%  97% ON 4l [place]         Assessment & Plan:     ICD-9-CM ICD-10-CM   1. Interstitial lung disease 515 J84.9 Spirometry with Graph     CBC     Basic Metabolic Panel (BMET)     D-dimer, quantitative (not at [facility])     Troponin I  2. Desquamative interstitial pneumonitis 516.37 J84.117   3. Chronic respiratory failure with hypoxia 518.83 J96.11    799.02    4. Right calf pain 729.5 M79.661   5. Malaise and fatigue 780.79 R53.81     R53.83   6. Smoking 305.1 Z72.0       1. Interstitial lung disease 2. Desquamative interstitial pneumonitis 3. Chronic respiratory failure with hypoxia  -  Breathing test suggests decline since February 2016 - dp CT CHest to monitor for progfression - re-eval if he truly is hypoxemic; - o2 for now  4. Right calf pain with   - d/w PCP - she will follow his [place] result. She thinks is  venous incompetency  5. Malaise and FAtigue - Unclear cause -  do cbc, bmet, d-dimer, troponin  -> will call with results to decice next step -> if ok d/w PCP - if worse advised ER   6. Smoking  - advised to quit     Dr. Brand Males, M.D., [facility].C.P Pulmonary and Critical Care Medicine Staff Physician [facility] Pager: [phone], If no answer or between  15:00h - 7:00h: call [phone]  02/14/2015 12:21 PM

## 2015-02-14 NOTE — Patient Instructions (Addendum)
1. Interstitial lung disease 2. Desquamative interstitial pneumonitis 3. Chronic respiratory failure with hypoxia  4 Smoking  -  Breathing test suggests decline since February 2016 -  4. Right calf pain  5. Malaise and FAtigue  - Unclear cause; unable to get ultrasound results from her primary care physician  PLAN - cont o2 for now; will reassess in future true need - Quit smoking  - do cbc, bmet, d-dimer, troponin  -> will call with results to decice next step  - will discuss with PCP [name], [name], MD  - CT chest (HJRCT v CTA PE protocol) depending on blood work  - if CT confirms ILD decline will have to consider empiric steroids  - Got to ER anytime you feel worse or not good

## 2015-02-15 ENCOUNTER — Telehealth: Payer: Self-pay | Admitting: Internal Medicine

## 2015-02-15 DIAGNOSIS — R0600 Dyspnea, unspecified: Secondary | ICD-10-CM

## 2015-02-15 DIAGNOSIS — R7989 Other specified abnormal findings of blood chemistry: Secondary | ICD-10-CM

## 2015-02-15 DIAGNOSIS — M79661 Pain in right lower leg: Secondary | ICD-10-CM

## 2015-02-15 LAB — D-DIMER, QUANTITATIVE (NOT AT ARMC): D-Dimer, Quant: 0.59 ug/mL-FEU — ABNORMAL HIGH (ref 0.00–0.48)

## 2015-02-15 NOTE — Telephone Encounter (Signed)
lmtcb for pt.  ATC home number and unable to leave VM. WCB.

## 2015-02-15 NOTE — Telephone Encounter (Signed)
D-dimer slightly high 0- he has callf pain. PCP did duplex one week but she does not have result. ANwyays, better to repeat it. Please order repeat  Holding off on CTA chest due to GFR 40s  If duplex abnormal with dvt - doc on call or TP has to handle it when I am away

## 2015-02-18 NOTE — Telephone Encounter (Signed)
Called and spoke to pt. Informed pt of the results and recs per MR. Order placed stat. Pt verbalized understanding and denied any further questions or concerns at this time.

## 2015-02-19 ENCOUNTER — Ambulatory Visit (HOSPITAL_COMMUNITY)
Admission: RE | Admit: 2015-02-19 | Discharge: 2015-02-19 | Disposition: A | Discharge status: Home / Self Care | Payer: Medicaid Other | Source: Ambulatory Visit | Attending: Cardiovascular Disease | Admitting: Cardiovascular Disease

## 2015-02-19 DIAGNOSIS — R0600 Dyspnea, unspecified: Secondary | ICD-10-CM | POA: Diagnosis not present

## 2015-02-19 DIAGNOSIS — R791 Abnormal coagulation profile: Secondary | ICD-10-CM | POA: Insufficient documentation

## 2015-02-19 DIAGNOSIS — M79661 Pain in right lower leg: Secondary | ICD-10-CM | POA: Insufficient documentation

## 2015-02-19 DIAGNOSIS — R7989 Other specified abnormal findings of blood chemistry: Secondary | ICD-10-CM

## 2015-02-24 ENCOUNTER — Telehealth: Payer: Self-pay | Admitting: Internal Medicine

## 2015-02-24 DIAGNOSIS — J849 Interstitial pulmonary disease, unspecified: Secondary | ICD-10-CM

## 2015-02-24 NOTE — Telephone Encounter (Signed)
Duplex LE is negative for DVT. Please set up    - HRCT chest and full PFT and return for fu to assess disease progression in ILD. First avail fine

## 2015-02-25 ENCOUNTER — Encounter: Payer: Self-pay | Admitting: Neurology

## 2015-02-25 ENCOUNTER — Ambulatory Visit (INDEPENDENT_AMBULATORY_CARE_PROVIDER_SITE_OTHER): Payer: Medicaid Other | Admitting: Neurology

## 2015-02-25 VITALS — BP 110/64 | HR 79 | Resp 20 | Ht 71.5 in | Wt 259.0 lb

## 2015-02-25 DIAGNOSIS — M79604 Pain in right leg: Secondary | ICD-10-CM

## 2015-02-25 DIAGNOSIS — R42 Dizziness and giddiness: Secondary | ICD-10-CM | POA: Insufficient documentation

## 2015-02-25 NOTE — Telephone Encounter (Signed)
Called and spoke to pt. Informed pt of the results and recs per MR. Orders placed. Pt verbalized understanding and denied any further questions or concerns at this time.    PCC's please schedule CT and PFT (any location). Pt will need OV after tests have been scheduled. THanks.

## 2015-02-25 NOTE — Progress Notes (Signed)
Quick Note:  Result note sent to PCP. Nothing further needed. ______

## 2015-02-25 NOTE — Progress Notes (Signed)
NEUROLOGY FOLLOW UP OFFICE NOTE  [name] [id number]  HISTORY OF PRESENT ILLNESS: I had the pleasure of seeing [name] in follow-up in the neurology clinic on 02/25/2015.  The patient was last seen 4 months ago for dizziness associated with transient vision loss on the left. As part of his workup, MRI brain did not show any acute infarct, there was an old right MCA infarct. Dopplers showed 80-99% right ICA stenosis, occluded left ICA. He underwent right CEA last March 2016. Since his last visit, he has been seen by his cardiologist and pulmonologist, Diovan dose reduced. He reports that his home BP measurements do not go below SBP 100 anymore. He still feels dizzy when coughing and bending down, no loss of consciousness. He has tingling in both feet, no focal weakness. He denies any headaches, speech difficulties or confusion. In the past, he reported transient loss of vision with the dizzy spells, he denies any further loss of vision but reports vision would get blurred. His main concern today is right leg pain, dopplers x 2 did not show DVT, however he was told there is a "problem with the small veins" and to see his neurologist about this. He has a vascular specialist and has not called him about the issue.  HPI: This is a very pleasant 59 yo RH man with a history of hypertension, hyperlipidemia, diabetes, interstitial lung disease, chronic respiratory failure, sleep apnea, tobacco use, CAD s/p stent, renal CA s/p nephrectomy, with episodes of dizziness associated with transient left vision loss. He started having symptoms at the beginning of January 2016, he reported dizziness with left vision loss to Dr. Chase Caller at the end of January. At that time, it was noted that the dizziness was orthostatic, with SBP in the 90s (his usual baseline is 546 systolic). He describes the dizziness as a floating sensation where his legs would start wobbling and give out, his whole body becomes shaky and he  may fall to the ground with no loss of consciousness. He has fallen 2-3 times in the past 2 months, last fall was a week ago. Associated with the dizziness is transient vision loss on the left eye where he cannot see objects in front on him, only seeing a white light. Symptoms occur only when he is coughing, bending over, or changing positions from sitting to standing. He would lie down and symptoms resolve in an hour. Symptoms do not occur when supine. He occasionally has headaches associated with these, with pain on the right of vertex, radiating down to his neck and shoulder blades. He denies any eye pain, no focal numbness/tingling/weakness. He has horizontal diplopia 1-2 times a week. MRI brain did not show any acute infarct. Carotid dopplers showed occluded left ICA, and 80-99% right ICA stenosis. He underwent right carotid CEA on 10/08/14. His hospital course was complicated by respiratory failure, acute on chronic kidney disease, and hypotension.    Diagnostic Data: I personally reviewed MRI brain without contrast which did not show any acute infarct. There was encephalomalacia in the right inferior frontal lobe and insular cortex consistent with old right MCA infarct. MRA head had shown distal left ICA absent and presumed occluded, otherwise normal intracranial MRA. Cervical MRI had showed moderate-sized central herniated nucleus pulposus C5-C6 encroaching on the ventral spinal cord without cord signal abnormality. Right foraminal and right ventral canal stenosis at C6-C7 with potential encroachment on the C7 nerve root uncovertebral disc osteophyte complex. He had carotid dopplers done which showed  heterogeneous plaque bilaterally, occluded left ICA, 80-99% right ICA stenosis. He was switched from aspirin to Plavix.   PAST MEDICAL HISTORY: Past Medical History  Diagnosis Date  . Essential hypertension, benign   . History of stroke     Right MCA distribution, residual left-sided weakness  .  Noncompliance   . COPD (chronic obstructive pulmonary disease)   . Hyperlipidemia   . History of pneumonia   . Type 2 diabetes mellitus 2011  . GERD (gastroesophageal reflux disease)   . Cocaine abuse   . Cholecystitis, acute 12/20/2013    Lap chole 6/5  . Coronary atherosclerosis of native coronary artery     a. 03/09/2013 Cath/PCI: LM nl, LAD: 50p, 26m (2.5x16 promus DES), LCX nl, OM1 min irregs, LPL/LPDA diff dzs, RCA nondom, mod diff dzs, EF 55%.  . Depression   . Anxiety   . History of nephrolithiasis   . Arthritis   . Hepatitis Late 1970s  . Sleep apnea     On CPAP, 4L O2 no cpap at home yet  . Renal cell carcinoma     Status post radical right nephrectomy August 2015  . Headache     MEDICATIONS: Current Outpatient Prescriptions on File Prior to Visit  Medication Sig Dispense Refill  . albuterol (PROVENTIL HFA;VENTOLIN HFA) 108 (90 BASE) MCG/ACT inhaler Inhale 2 puffs into the lungs every 6 (six) hours as needed for wheezing or shortness of breath.    [name] albuterol (PROVENTIL) (2.5 MG/3ML) 0.083% nebulizer solution Take 3 mLs (2.5 mg total) by nebulization every 3 (three) hours as needed for wheezing or shortness of breath. 75 mL 12  . alfuzosin (UROXATRAL) 10 MG 24 hr tablet Take 10 mg by mouth daily with breakfast.    . ALPRAZolam (XANAX) 0.25 MG tablet Take 0.25 mg by mouth 2 (two) times daily as needed for anxiety.    [name] atorvastatin (LIPITOR) 20 MG tablet Take 20 mg by mouth daily.    . budesonide-formoterol (SYMBICORT) 160-4.5 MCG/ACT inhaler Inhale 2 puffs into the lungs 2 (two) times daily. 1 Inhaler 3  . citalopram (CELEXA) 20 MG tablet Take 40 mg by mouth daily.     . clopidogrel (PLAVIX) 75 MG tablet Take 1 tablet (75 mg total) by mouth daily. 30 tablet 11  . diltiazem (CARDIZEM CD) 240 MG 24 hr capsule Take 240 mg by mouth daily.    . Fish Oil-Cholecalciferol (FISH OIL + D3 PO) Take 1 capsule by mouth 4 (four) times daily.     [name] FLUoxetine (PROZAC) 10 MG capsule Take  10 mg by mouth every morning.    . furosemide (LASIX) 40 MG tablet Take 40 mg by mouth daily.    [name] gabapentin (NEURONTIN) 300 MG capsule Take 300 mg by mouth at bedtime.    [name] glipiZIDE (GLUCOTROL) 10 MG tablet Take 10 mg by mouth 2 (two) times daily.    [name] ipratropium-albuterol (DUONEB) 0.5-2.5 (3) MG/3ML SOLN Take 3 mLs by nebulization every 6 (six) hours as needed (Shortness of breath).    . isosorbide mononitrate (IMDUR) 60 MG 24 hr tablet Take 1 tablet (60 mg total) by mouth daily. 90 tablet 3  . omeprazole (PRILOSEC) 40 MG capsule Take 40 mg by mouth daily.    . ranitidine (ZANTAC) 300 MG tablet Take 300 mg by mouth every morning.     . solifenacin (VESICARE) 5 MG tablet Take 5 mg by mouth daily.    . tamsulosin (FLOMAX) 0.4 MG CAPS capsule Take 1 capsule (  0.4 mg total) by mouth daily after supper. 30 capsule 1  . nitroGLYCERIN (NITROSTAT) 0.4 MG SL tablet Place 1 tablet (0.4 mg total) under the tongue every 5 (five) minutes as needed for chest pain. (Patient not taking: Reported on 02/25/2015) 25 tablet 3   No current facility-administered medications on file prior to visit.    ALLERGIES: Allergies  Allergen Reactions  . Oxycodone Itching and Nausea Only    FAMILY HISTORY: Family History  Problem Relation Age of Onset  . Hypertension    . Diabetes    . Stroke    . Lung cancer Father   . Cancer Mother     Thyroid - living in her 73's.  . Cancer Maternal Grandmother     Breast  . Cancer Maternal Grandfather     Throat and stomach  . CAD Father   . CAD Brother     SOCIAL HISTORY: History   Social History  . Marital Status: Single    Spouse Name: N/A  . Number of Children: N/A  . Years of Education: N/A   Occupational History  . N/A    Social History Main Topics  . Smoking status: Current Every Day Smoker -- 0.50 packs/day for 41 years    Types: Cigarettes    Start date: 05/03/1972  . Smokeless tobacco: Never Used     Comment: smokes 1/2 pack daily  . Alcohol  Use: No  . Drug Use: No     Comment: last used cocaine 11/2013. but is cocaine + this admit July 2015  . Sexual Activity: No   Other Topics Concern  . Not on file   Social History Narrative   Lives in [place] with his mother.  He does not work.    REVIEW OF SYSTEMS: Constitutional: No fevers, chills, or sweats, no generalized fatigue, change in appetite Eyes: as above Ear, nose and throat: No hearing loss, ear pain, nasal congestion, sore throat Cardiovascular: No chest pain, palpitations Respiratory:  No shortness of breath at rest or with exertion, wheezes GastrointestinaI: No nausea, vomiting, diarrhea, abdominal pain, fecal incontinence Genitourinary:  No dysuria, urinary retention or frequency Musculoskeletal:  No neck pain, back pain. Right leg pain Integumentary: No rash, pruritus, skin lesions Neurological: as above Psychiatric: No depression, insomnia, anxiety Endocrine: No palpitations, fatigue, diaphoresis, mood swings, change in appetite, change in weight, increased thirst Hematologic/Lymphatic:  No anemia, purpura, petechiae. Allergic/Immunologic: no itchy/runny eyes, nasal congestion, recent allergic reactions, rashes  PHYSICAL EXAM: Filed Vitals:   02/25/15 0925  BP: 110/64  Pulse: 79  Resp: 20   General: No acute distress, on chronic home O2 Head:  Normocephalic/atraumatic Neck: supple, no paraspinal tenderness, full range of motion Heart:  Regular rate and rhythm Lungs:  Clear to auscultation bilaterally Back: No paraspinal tenderness Skin/Extremities: No rash Neurological Exam:alert and oriented to person, place, and time, no dysarthria or aphasia, Fund of knowledge is appropriate. Recent and remote memory are intact. Attention and concentration are normal. Able to name objects and repeat phrases. Cranial nerves: CN I: not tested CN II: pupils equal, round and reactive to light, visual fields intact, fundi unremarkable. CN III, IV, VI: full  range of motion, no nystagmus, no ptosis CN V: decreased light touch on left CN VII: upper and lower face symmetric CN VIII: hearing intact to finger rub CN IX, X: gag intact, uvula midline CN XI: sternocleidomastoid and trapezius muscles intact CN XII: tongue midline Bulk & Tone: normal, no fasciculations. Motor: 5/5 throughout with  no pronator drift but note of decreased fine finger movements on the left hand (similar to prior), difficulty testing right LE due to right leg pain Sensation: decreased light touch on left (similar to prior). Romberg test positive sway Deep Tendon Reflexes: asymmetric reflexes on the upper extremities: +1 on right UE, brisk +3 on left UE with +Hoffman sign on left. +2 on bilateral patella, absent ankle jerks bilaterally (similar to prior). no ankle clonus Plantar responses: mute on left, downgoing on right Cerebellar: mild ataxia on left finger to nose (similar to prior) Gait: slow and cautious, ambulates with cane, right leg pain, unable to tandem but no ataxia noted Tremor: none  IMPRESSION: This is a pleasant 59 yo RH man with vascular risk factors including hypertension, hyperlipidemia, diabetes, tobacco use, CAD s/p stent, with interstitial lung disease, chronic respiratory failure, sleep apnea, renal CA s/p nephrectomy, right MCA stroke, right carotid stenosis s/p right CEA, who presented for episodes of dizziness associated with transient left vision loss that occur with bending down and coughing, consistent with orthostatic dizziness. BP medication has been adjusted, he still has dizziness with bending and coughing, reports SBP is mostly above 100 now. He will continue to monitor BP. His main concern today is right leg pain, doppler x 2 did not show DVT however he has been told there were "problems with the small veins" and to see his neurologist. He was instructed today to discuss these with his vascular specialist. His neurological exam today is unchanged  from previous exams. Continue current medications, including Plavix, smoking cessation. He will follow-up in 1 year and knows to call our office for any change in neurological symptoms.   Thank you for allowing me to participate in his care.  Please do not hesitate to call for any questions or concerns.  The duration of this appointment visit was 14 minutes of face-to-face time with the patient.  Greater than 50% of this time was spent in counseling, explanation of diagnosis, planning of further management, and coordination of care.   Ellouise Newer, M.D.   CC: Dr. [name]

## 2015-02-25 NOTE — Patient Instructions (Signed)
1. Continue all your current medications 2. Call the vein specialist regarding right leg symptoms and doppler report 3. Follow-up in 1 year, call for any changes

## 2015-02-26 NOTE — Telephone Encounter (Signed)
Pt has been scheduled for CT on 8/17 at [place] here on 8/11. Pt aware of appts/spm

## 2015-02-27 ENCOUNTER — Telehealth: Payer: Self-pay

## 2015-02-27 NOTE — Telephone Encounter (Signed)
Phone call from pt.  Reported swelling in both feet with extension into the right calf for approx. 3 weeks.   C/o pain in the right knee and ankle with swollen, tender veins in the right calf.  Stated there is no redness in the right lower extremity, but "it is warm at times", in calf area.  Reported his pulmonologist ordered a test to check on a blood clot.  Reported there was no blood clot, and he was instructed to see Dr. Donnetta Hutching about his veins.  Stated he elevates his legs and it doesn't really help the swelling.  Advised will consult with vein nurse, and will have a scheduler to call him with appt.  Verb. Understanding.

## 2015-02-28 ENCOUNTER — Encounter (HOSPITAL_COMMUNITY): Payer: Self-pay

## 2015-02-28 ENCOUNTER — Observation Stay (HOSPITAL_COMMUNITY)
Admission: EM | Admit: 2015-02-28 | Discharge: 2015-03-01 | Disposition: A | Discharge status: Home / Self Care | Payer: Medicaid Other | Attending: Internal Medicine | Admitting: Internal Medicine

## 2015-02-28 ENCOUNTER — Telehealth: Payer: Self-pay | Admitting: Vascular Surgery

## 2015-02-28 ENCOUNTER — Ambulatory Visit (INDEPENDENT_AMBULATORY_CARE_PROVIDER_SITE_OTHER): Payer: Self-pay | Admitting: Internal Medicine

## 2015-02-28 ENCOUNTER — Other Ambulatory Visit: Payer: Self-pay

## 2015-02-28 ENCOUNTER — Encounter (INDEPENDENT_AMBULATORY_CARE_PROVIDER_SITE_OTHER): Payer: Medicaid Other

## 2015-02-28 ENCOUNTER — Emergency Department (HOSPITAL_COMMUNITY): Payer: Medicaid Other

## 2015-02-28 DIAGNOSIS — M79604 Pain in right leg: Secondary | ICD-10-CM | POA: Diagnosis present

## 2015-02-28 DIAGNOSIS — K759 Inflammatory liver disease, unspecified: Secondary | ICD-10-CM | POA: Insufficient documentation

## 2015-02-28 DIAGNOSIS — R0789 Other chest pain: Secondary | ICD-10-CM | POA: Diagnosis present

## 2015-02-28 DIAGNOSIS — I2510 Atherosclerotic heart disease of native coronary artery without angina pectoris: Secondary | ICD-10-CM | POA: Diagnosis not present

## 2015-02-28 DIAGNOSIS — E118 Type 2 diabetes mellitus with unspecified complications: Secondary | ICD-10-CM

## 2015-02-28 DIAGNOSIS — R079 Chest pain, unspecified: Secondary | ICD-10-CM

## 2015-02-28 DIAGNOSIS — F419 Anxiety disorder, unspecified: Secondary | ICD-10-CM | POA: Diagnosis not present

## 2015-02-28 DIAGNOSIS — J449 Chronic obstructive pulmonary disease, unspecified: Secondary | ICD-10-CM | POA: Diagnosis present

## 2015-02-28 DIAGNOSIS — R55 Syncope and collapse: Secondary | ICD-10-CM | POA: Diagnosis not present

## 2015-02-28 DIAGNOSIS — E1165 Type 2 diabetes mellitus with hyperglycemia: Secondary | ICD-10-CM | POA: Diagnosis present

## 2015-02-28 DIAGNOSIS — M1990 Unspecified osteoarthritis, unspecified site: Secondary | ICD-10-CM | POA: Diagnosis not present

## 2015-02-28 DIAGNOSIS — E119 Type 2 diabetes mellitus without complications: Secondary | ICD-10-CM | POA: Insufficient documentation

## 2015-02-28 DIAGNOSIS — J9611 Chronic respiratory failure with hypoxia: Secondary | ICD-10-CM

## 2015-02-28 DIAGNOSIS — K219 Gastro-esophageal reflux disease without esophagitis: Secondary | ICD-10-CM | POA: Insufficient documentation

## 2015-02-28 DIAGNOSIS — G4733 Obstructive sleep apnea (adult) (pediatric): Secondary | ICD-10-CM | POA: Diagnosis present

## 2015-02-28 DIAGNOSIS — Z8701 Personal history of pneumonia (recurrent): Secondary | ICD-10-CM | POA: Insufficient documentation

## 2015-02-28 DIAGNOSIS — R05 Cough: Secondary | ICD-10-CM | POA: Diagnosis not present

## 2015-02-28 DIAGNOSIS — I10 Essential (primary) hypertension: Secondary | ICD-10-CM | POA: Diagnosis not present

## 2015-02-28 DIAGNOSIS — Z85828 Personal history of other malignant neoplasm of skin: Secondary | ICD-10-CM | POA: Insufficient documentation

## 2015-02-28 DIAGNOSIS — F329 Major depressive disorder, single episode, unspecified: Secondary | ICD-10-CM | POA: Diagnosis not present

## 2015-02-28 DIAGNOSIS — J439 Emphysema, unspecified: Secondary | ICD-10-CM | POA: Diagnosis present

## 2015-02-28 DIAGNOSIS — Z7902 Long term (current) use of antithrombotics/antiplatelets: Secondary | ICD-10-CM | POA: Diagnosis not present

## 2015-02-28 DIAGNOSIS — Z9119 Patient's noncompliance with other medical treatment and regimen: Secondary | ICD-10-CM | POA: Diagnosis not present

## 2015-02-28 DIAGNOSIS — E782 Mixed hyperlipidemia: Secondary | ICD-10-CM | POA: Diagnosis present

## 2015-02-28 DIAGNOSIS — E785 Hyperlipidemia, unspecified: Secondary | ICD-10-CM | POA: Insufficient documentation

## 2015-02-28 DIAGNOSIS — Z79899 Other long term (current) drug therapy: Secondary | ICD-10-CM | POA: Diagnosis not present

## 2015-02-28 DIAGNOSIS — J849 Interstitial pulmonary disease, unspecified: Secondary | ICD-10-CM | POA: Diagnosis present

## 2015-02-28 DIAGNOSIS — J441 Chronic obstructive pulmonary disease with (acute) exacerbation: Secondary | ICD-10-CM | POA: Insufficient documentation

## 2015-02-28 DIAGNOSIS — Z720 Tobacco use: Secondary | ICD-10-CM | POA: Insufficient documentation

## 2015-02-28 DIAGNOSIS — G4730 Sleep apnea, unspecified: Secondary | ICD-10-CM | POA: Diagnosis not present

## 2015-02-28 DIAGNOSIS — IMO0002 Reserved for concepts with insufficient information to code with codable children: Secondary | ICD-10-CM | POA: Diagnosis present

## 2015-02-28 LAB — I-STAT CHEM 8, ED
BUN: 17 mg/dL (ref 6–20)
CALCIUM ION: 1.19 mmol/L (ref 1.12–1.23)
Chloride: 100 mmol/L — ABNORMAL LOW (ref 101–111)
Creatinine, Ser: 1.8 mg/dL — ABNORMAL HIGH (ref 0.61–1.24)
Glucose, Bld: 172 mg/dL — ABNORMAL HIGH (ref 65–99)
HEMATOCRIT: 39 % (ref 39.0–52.0)
Hemoglobin: 13.3 g/dL (ref 13.0–17.0)
Potassium: 4.2 mmol/L (ref 3.5–5.1)
Sodium: 136 mmol/L (ref 135–145)
TCO2: 27 mmol/L (ref 0–100)

## 2015-02-28 LAB — URINALYSIS, ROUTINE W REFLEX MICROSCOPIC
BILIRUBIN URINE: NEGATIVE
Glucose, UA: NEGATIVE mg/dL
HGB URINE DIPSTICK: NEGATIVE
Ketones, ur: NEGATIVE mg/dL
Leukocytes, UA: NEGATIVE
Nitrite: NEGATIVE
PROTEIN: NEGATIVE mg/dL
Specific Gravity, Urine: 1.006 (ref 1.005–1.030)
Urobilinogen, UA: 0.2 mg/dL (ref 0.0–1.0)
pH: 6.5 (ref 5.0–8.0)

## 2015-02-28 LAB — CBC WITH DIFFERENTIAL/PLATELET
BASOS PCT: 0 % (ref 0–1)
Basophils Absolute: 0 10*3/uL (ref 0.0–0.1)
EOS ABS: 0.1 10*3/uL (ref 0.0–0.7)
Eosinophils Relative: 1 % (ref 0–5)
HCT: 38.6 % — ABNORMAL LOW (ref 39.0–52.0)
Hemoglobin: 12.6 g/dL — ABNORMAL LOW (ref 13.0–17.0)
LYMPHS ABS: 1.2 10*3/uL (ref 0.7–4.0)
Lymphocytes Relative: 13 % (ref 12–46)
MCH: 28.3 pg (ref 26.0–34.0)
MCHC: 32.6 g/dL (ref 30.0–36.0)
MCV: 86.5 fL (ref 78.0–100.0)
Monocytes Absolute: 0.5 10*3/uL (ref 0.1–1.0)
Monocytes Relative: 6 % (ref 3–12)
NEUTROS PCT: 80 % — AB (ref 43–77)
Neutro Abs: 6.9 10*3/uL (ref 1.7–7.7)
Platelets: 176 10*3/uL (ref 150–400)
RBC: 4.46 MIL/uL (ref 4.22–5.81)
RDW: 13.9 % (ref 11.5–15.5)
WBC: 8.8 10*3/uL (ref 4.0–10.5)

## 2015-02-28 LAB — RAPID URINE DRUG SCREEN, HOSP PERFORMED
Amphetamines: NOT DETECTED
Barbiturates: NOT DETECTED
Benzodiazepines: NOT DETECTED
COCAINE: NOT DETECTED
OPIATES: POSITIVE — AB
TETRAHYDROCANNABINOL: NOT DETECTED

## 2015-02-28 LAB — I-STAT TROPONIN, ED: TROPONIN I, POC: 0 ng/mL (ref 0.00–0.08)

## 2015-02-28 LAB — D-DIMER, QUANTITATIVE: D-Dimer, Quant: 0.37 ug/mL-FEU (ref 0.00–0.48)

## 2015-02-28 LAB — GLUCOSE, CAPILLARY
GLUCOSE-CAPILLARY: 213 mg/dL — AB (ref 65–99)
Glucose-Capillary: 175 mg/dL — ABNORMAL HIGH (ref 65–99)

## 2015-02-28 LAB — TROPONIN I: Troponin I: <0.03 ng/mL (ref <0.031)

## 2015-02-28 MED ORDER — ENOXAPARIN SODIUM 40 MG/0.4ML ~~LOC~~ SOLN
40.0000 mg | SUBCUTANEOUS | Status: DC
Start: 2015-02-28 — End: 2015-03-01
  Administered 2015-02-28: 40 mg via SUBCUTANEOUS
  Filled 2015-02-28: qty 0.4

## 2015-02-28 MED ORDER — ALPRAZOLAM 0.25 MG PO TABS: 0.2500 mg | ORAL_TABLET | Freq: Two times a day (BID) | ORAL | Status: DC | PRN

## 2015-02-28 MED ORDER — CLOPIDOGREL BISULFATE 75 MG PO TABS
75.0000 mg | ORAL_TABLET | Freq: Every day | ORAL | Status: DC
  Administered 2015-03-01: 75 mg via ORAL
  Filled 2015-02-28: qty 1

## 2015-02-28 MED ORDER — DILTIAZEM HCL ER COATED BEADS 240 MG PO CP24
240.0000 mg | ORAL_CAPSULE | Freq: Every day | ORAL | Status: DC
  Administered 2015-03-01: 240 mg via ORAL
  Filled 2015-02-28: qty 1

## 2015-02-28 MED ORDER — CETYLPYRIDINIUM CHLORIDE 0.05 % MT LIQD
7.0000 mL | Freq: Two times a day (BID) | OROMUCOSAL | Status: DC
  Administered 2015-02-28: 7 mL via OROMUCOSAL

## 2015-02-28 MED ORDER — GABAPENTIN 300 MG PO CAPS
300.0000 mg | ORAL_CAPSULE | Freq: Every day | ORAL | Status: DC
  Administered 2015-02-28: 300 mg via ORAL
  Filled 2015-02-28: qty 1

## 2015-02-28 MED ORDER — VITAMIN D 1000 UNITS PO TABS
2000.0000 [IU] | ORAL_TABLET | Freq: Every day | ORAL | Status: DC
  Administered 2015-03-01: 2000 [IU] via ORAL
  Filled 2015-02-28: qty 2

## 2015-02-28 MED ORDER — ATORVASTATIN CALCIUM 20 MG PO TABS
20.0000 mg | ORAL_TABLET | Freq: Every day | ORAL | Status: DC
  Administered 2015-03-01: 20 mg via ORAL
  Filled 2015-02-28: qty 1

## 2015-02-28 MED ORDER — CITALOPRAM HYDROBROMIDE 20 MG PO TABS
40.0000 mg | ORAL_TABLET | Freq: Every day | ORAL | Status: DC
  Administered 2015-03-01: 40 mg via ORAL
  Filled 2015-02-28: qty 2

## 2015-02-28 MED ORDER — ALBUTEROL SULFATE (2.5 MG/3ML) 0.083% IN NEBU: 2.5000 mg | INHALATION_SOLUTION | RESPIRATORY_TRACT | Status: DC | PRN

## 2015-02-28 MED ORDER — PANTOPRAZOLE SODIUM 40 MG PO TBEC
80.0000 mg | DELAYED_RELEASE_TABLET | Freq: Every day | ORAL | Status: DC
  Administered 2015-03-01: 80 mg via ORAL
  Filled 2015-02-28: qty 2

## 2015-02-28 MED ORDER — NITROGLYCERIN 0.4 MG SL SUBL: 0.4000 mg | SUBLINGUAL_TABLET | SUBLINGUAL | Status: DC | PRN

## 2015-02-28 MED ORDER — IRBESARTAN 75 MG PO TABS
37.5000 mg | ORAL_TABLET | Freq: Every day | ORAL | Status: DC
  Administered 2015-03-01: 37.5 mg via ORAL
  Filled 2015-02-28: qty 1

## 2015-02-28 MED ORDER — FUROSEMIDE 40 MG PO TABS
40.0000 mg | ORAL_TABLET | Freq: Every day | ORAL | Status: DC
  Administered 2015-03-01: 40 mg via ORAL
  Filled 2015-02-28: qty 1

## 2015-02-28 MED ORDER — INSULIN ASPART 100 UNIT/ML ~~LOC~~ SOLN
0.0000 [IU] | Freq: Every day | SUBCUTANEOUS | Status: DC
  Administered 2015-02-28: 2 [IU] via SUBCUTANEOUS

## 2015-02-28 MED ORDER — ISOSORBIDE MONONITRATE ER 60 MG PO TB24
60.0000 mg | ORAL_TABLET | Freq: Every day | ORAL | Status: DC
  Administered 2015-03-01: 60 mg via ORAL
  Filled 2015-02-28: qty 1

## 2015-02-28 MED ORDER — TAMSULOSIN HCL 0.4 MG PO CAPS: 0.4000 mg | ORAL_CAPSULE | Freq: Every day | ORAL | Status: DC

## 2015-02-28 MED ORDER — SODIUM CHLORIDE 0.9 % IJ SOLN
3.0000 mL | Freq: Two times a day (BID) | INTRAMUSCULAR | Status: DC
  Administered 2015-02-28 – 2015-03-01 (×2): 3 mL via INTRAVENOUS

## 2015-02-28 MED ORDER — DARIFENACIN HYDROBROMIDE ER 7.5 MG PO TB24
7.5000 mg | ORAL_TABLET | Freq: Every day | ORAL | Status: DC
  Administered 2015-03-01: 7.5 mg via ORAL
  Filled 2015-02-28: qty 1

## 2015-02-28 MED ORDER — ALFUZOSIN HCL ER 10 MG PO TB24
10.0000 mg | ORAL_TABLET | Freq: Every day | ORAL | Status: DC
  Administered 2015-03-01: 10 mg via ORAL
  Filled 2015-02-28 (×2): qty 1

## 2015-02-28 MED ORDER — ACETAMINOPHEN 325 MG PO TABS
650.0000 mg | ORAL_TABLET | Freq: Four times a day (QID) | ORAL | Status: DC | PRN
  Administered 2015-03-01: 650 mg via ORAL
  Filled 2015-02-28: qty 2

## 2015-02-28 MED ORDER — IPRATROPIUM-ALBUTEROL 0.5-2.5 (3) MG/3ML IN SOLN
3.0000 mL | Freq: Four times a day (QID) | RESPIRATORY_TRACT | Status: DC
  Administered 2015-02-28 – 2015-03-01 (×3): 3 mL via RESPIRATORY_TRACT
  Filled 2015-02-28 (×3): qty 3

## 2015-02-28 MED ORDER — NITROGLYCERIN 0.4 MG SL SUBL
0.4000 mg | SUBLINGUAL_TABLET | Freq: Once | SUBLINGUAL | Status: AC
  Administered 2015-02-28: 0.4 mg via SUBLINGUAL

## 2015-02-28 MED ORDER — ACETAMINOPHEN 650 MG RE SUPP: 650.0000 mg | Freq: Four times a day (QID) | RECTAL | Status: DC | PRN

## 2015-02-28 MED ORDER — INSULIN ASPART 100 UNIT/ML ~~LOC~~ SOLN
0.0000 [IU] | Freq: Three times a day (TID) | SUBCUTANEOUS | Status: DC
  Administered 2015-02-28 – 2015-03-01 (×2): 2 [IU] via SUBCUTANEOUS

## 2015-02-28 MED ORDER — BUDESONIDE-FORMOTEROL FUMARATE 160-4.5 MCG/ACT IN AERO
2.0000 | INHALATION_SPRAY | Freq: Two times a day (BID) | RESPIRATORY_TRACT | Status: DC
  Administered 2015-02-28 – 2015-03-01 (×2): 2 via RESPIRATORY_TRACT
  Filled 2015-02-28 (×2): qty 6

## 2015-02-28 MED ORDER — FAMOTIDINE 20 MG PO TABS
40.0000 mg | ORAL_TABLET | Freq: Every day | ORAL | Status: DC
  Administered 2015-03-01: 40 mg via ORAL
  Filled 2015-02-28: qty 2

## 2015-02-28 MED ORDER — NITROGLYCERIN 0.4 MG SL SUBL: 0.4000 mg | SUBLINGUAL_TABLET | Freq: Once | SUBLINGUAL | Status: DC

## 2015-02-28 MED ORDER — FLUOXETINE HCL 10 MG PO CAPS
10.0000 mg | ORAL_CAPSULE | Freq: Every morning | ORAL | Status: DC
  Administered 2015-03-01: 10 mg via ORAL
  Filled 2015-02-28: qty 1

## 2015-02-28 NOTE — Telephone Encounter (Signed)
LM for pt re appt, dpm °

## 2015-02-28 NOTE — ED Notes (Signed)
Patient transported to X-ray 

## 2015-02-28 NOTE — Progress Notes (Signed)
Pt came in for PFT appointment today.  During first attempt, pt starting coughing and briefly passed out.  PFT CMA called for Dr. Annamaria Boots to exam pt.  Upon his arrival, pt tearful and c/o CP with o2 sat of 92-94% on 5lpm pulsed with pulse in the 70s.  Pt taken to exam room via wheelchair at 9:13am at which time he became slightly unresponsive and gasping for air.  This lasted briefly at which time pt was then oriented with pulse in the 70s, BP 152/84, and o2 sat 92% on 5lpm pulsed. Pt placed on monitor, o2 switched to 6lpm continuous with o2 sat 96-98%, pulse in the 70s and #20 IV started in right hand. NS at [facility].  At 9:19, pt still c/o 6/10 CP with BP 154/80, pulse 70s, o2 sat 94 on 6lpm continuous o2.  1 nitro SL given per Dr. Annamaria Boots VO.  At 9:22, EMS arrived with pt stating CP now at 4/10.  Pt transported to [facility] ED for further workup.  Pt's mother in office with pt and was updated by Dr. Annamaria Boots per pt's request.  She was also given pt's belongings (wheelchair, portable o2, and glasses) per pt's request.

## 2015-02-28 NOTE — ED Notes (Signed)
Arrived from pulmonologist c/o cp s/p pft. Given ntg x1 states some relief. Endorses dizziness given 200 ml pta. Continues to c/o pain 5/10

## 2015-02-28 NOTE — Telephone Encounter (Signed)
-----   Message from Rica Records, RN sent at 02/28/2015 12:28 PM EDT ----- Regarding: scheduling  Please schedule Mr. Campanaro for new VV with Dr. Donnetta Hutching (no lab needed per TFE venous duplex done 02-19-2015 at [facility] vascular lab).  Mr. Heinz is an established patient with Dr. Donnetta Hutching for vascular issues.  Mr. Diiorio is C/o swelling in both feet and right calf.  Painful VV on right calf.

## 2015-02-28 NOTE — H&P (Addendum)
History and Physical  [name] [name]:[id number] DOB: 09/01/1955 DOA: 02/28/2015  Referring physician: Dr. Davonna Belling, EDP PCP: [name], BETTY G, MD  Outpatient Specialists:  1. Pulmonology: Dr. Brand Males 2. Cardiology: Dr. [name]  Chief Complaint: Chest pain and passing out episode at pulmonologist's office.  HPI: [name] is a 59 y.o. male with extensive PMH-chronic respiratory failure on home oxygen 6 L/m, COPD, OSA-not on nightly C Pap, ILD, CAD status post stent 2 years ago, ongoing tobacco abuse, HTN, HLD, DM 2 with renal complications, stage III chronic kidney disease, GERD, anxiety & depression, transferred from pulmonologist's office to [facility] ED on 02/28/15 due to above complaints. Patient was there to perform PFTs. He took a deep breath and blew into the machine. He then was asked to hold his breath and repeat the process. However he started violently coughing and then he states that he does not remember anything. Duration of LOC is not clear but seemed brief period his oxygen saturations at this time were 92-94 percent, pulse in the 70s. Patient was taken by wheelchair to exam room and then he became unresponsive again and was gasping for air. He then started complaining of anterior chest pain, pressure-like, "felt like might chest was going to explode", nonradiating, not associated with nausea, vomiting or diaphoresis. He was given a dose of sublingual nitroglycerin and states that shortly thereafter his chest pain completely resolved. He denies any chest pain at this time. He states that he has chronic dyspnea which has not changed. He has intermittent dry coughing spells associated with dizziness. He also has 3 weeks history of right lower extremity pain and 2 venous Dopplers apparently were negative for DVT. In the ED, lab work significant for creatinine 1.8, negative d-dimer and troponin 1 and chest x-ray shows no acute lung disease but probable emphysema  and mild chronic interstitial change. Hospitalist admission was requested.   Review of Systems: All systems reviewed and apart from history of presenting illness, are negative.  Past Medical History  Diagnosis Date  . Essential hypertension, benign   . History of stroke     Right MCA distribution, residual left-sided weakness  . Noncompliance   . COPD (chronic obstructive pulmonary disease)   . Hyperlipidemia   . History of pneumonia   . Type 2 diabetes mellitus 2011  . GERD (gastroesophageal reflux disease)   . Cocaine abuse   . Cholecystitis, acute 12/20/2013    Lap chole 6/5  . Coronary atherosclerosis of native coronary artery     a. 03/09/2013 Cath/PCI: LM nl, LAD: 50p, 25m (2.5x16 promus DES), LCX nl, OM1 min irregs, LPL/LPDA diff dzs, RCA nondom, mod diff dzs, EF 55%.  . Depression   . Anxiety   . History of nephrolithiasis   . Arthritis   . Hepatitis Late 1970s  . Sleep apnea     On CPAP, 4L O2 no cpap at home yet  . Renal cell carcinoma     Status post radical right nephrectomy August 2015  . Headache    Past Surgical History  Procedure Laterality Date  . Appendectomy  1970's  . Cholecystectomy N/A 12/22/2013    Procedure: LAPAROSCOPIC CHOLECYSTECTOMY ;  Surgeon: [name], MD;  Location: [place];  Service: General;  Laterality: N/A;  . Robot assisted laparoscopic nephrectomy Right 02/21/2014    Procedure: ROBOTIC ASSISTED LAPAROSCOPIC RIGHT NEPHRECTOMY ;  Surgeon: [name], MD;  Location: [facility];  Service: Urology;  Laterality: Right;  .  Laparoscopic lysis of adhesions  02/21/2014    Procedure: LAPAROSCOPIC LYSIS OF ADHESIONS EXTINSIVE;  Surgeon: [name], MD;  Location: [facility];  Service: Urology;;  . Video assisted thoracoscopy Left 05/18/2014    Procedure: LEFT VIDEO ASSISTED THORACOSCOPY;  Surgeon: [name], MD;  Location: [place];  Service: Thoracic;  Laterality: Left;  . Lung biopsy Left 05/18/2014    Procedure: LUNG BIOPSY left upper  lobe & left lower lobe;  Surgeon: [name], MD;  Location: [place];  Service: Thoracic;  Laterality: Left;  . Video bronchoscopy  05/18/2014    Procedure: VIDEO BRONCHOSCOPY;  Surgeon: [name], MD;  Location: [place];  Service: Thoracic;;  . Left heart catheterization with coronary angiogram N/A 06/02/2012    Procedure: LEFT HEART CATHETERIZATION WITH CORONARY ANGIOGRAM;  Surgeon: [name], MD;  Location: [facility] CATH LAB;  Service: Cardiovascular;  Laterality: N/A;  . Left heart catheterization with coronary angiogram N/A 03/09/2013    Procedure: LEFT HEART CATHETERIZATION WITH CORONARY ANGIOGRAM;  Surgeon: [name], MD;  Location: [facility] CATH LAB;  Service: Cardiovascular;  Laterality: N/A;  . Left heart catheterization with coronary angiogram N/A 12/20/2013    Procedure: LEFT HEART CATHETERIZATION WITH CORONARY ANGIOGRAM;  Surgeon: [name], MD;  Location: [facility] CATH LAB;  Service: Cardiovascular;  Laterality: N/A;  . Endarterectomy Right 10/08/2014    Procedure: Right ENDARTERECTOMY CAROTID;  Surgeon: [name], MD;  Location: [place];  Service: Vascular;  Laterality: Right;  . Patch angioplasty Right 10/08/2014    Procedure: PATCH ANGIOPLASTY Right Carotid;  Surgeon: [name], MD;  Location: [place];  Service: Vascular;  Laterality: Right;   Social History:  reports that he has been smoking Cigarettes.  He started smoking about 42 years ago. He has a 20.5 pack-year smoking history. He has never used smokeless tobacco. He reports that he does not drink alcohol or use illicit drugs. Single. Has been living with mother for last 1 year. Ambulate with the help of a walker. Continues to smoke half a pack of cigarettes per day.  Allergies  Allergen Reactions  . Oxycodone Itching and Nausea Only    Family History  Problem Relation Age of Onset  . Hypertension    . Diabetes    . Stroke    . Lung cancer Father   . Cancer Mother     Thyroid - living in her  50's.  . Cancer Maternal Grandmother     Breast  . Cancer Maternal Grandfather     Throat and stomach  . CAD Father   . CAD Brother     Prior to Admission medications   Medication Sig Start Date End Date Taking? Authorizing Provider  albuterol (PROVENTIL HFA;VENTOLIN HFA) 108 (90 BASE) MCG/ACT inhaler Inhale 2 puffs into the lungs every 6 (six) hours as needed for wheezing or shortness of breath.   Yes Historical Provider, MD  alfuzosin (UROXATRAL) 10 MG 24 hr tablet Take 10 mg by mouth daily with breakfast.   Yes Historical Provider, MD  ALPRAZolam (XANAX) 0.25 MG tablet Take 0.25 mg by mouth 2 (two) times daily as needed for anxiety.   Yes Historical Provider, MD  atorvastatin (LIPITOR) 20 MG tablet Take 20 mg by mouth daily.   Yes Historical Provider, MD  budesonide-formoterol (SYMBICORT) 160-4.5 MCG/ACT inhaler Inhale 2 puffs into the lungs 2 (two) times daily. 05/25/14  Yes Coolidge Breeze, PA-C  cholecalciferol (VITAMIN D) 1000 UNITS tablet Take 2,000 Units  by mouth daily.   Yes Historical Provider, MD  citalopram (CELEXA) 20 MG tablet Take 40 mg by mouth daily.    Yes Historical Provider, MD  clopidogrel (PLAVIX) 75 MG tablet Take 1 tablet (75 mg total) by mouth daily. 09/26/14  Yes Cameron Sprang, MD  diltiazem (CARDIZEM CD) 240 MG 24 hr capsule Take 240 mg by mouth daily.   Yes Historical Provider, MD  Fish Oil-Cholecalciferol (FISH OIL + D3 PO) Take 1 capsule by mouth 4 (four) times daily.    Yes Historical Provider, MD  FLUoxetine (PROZAC) 10 MG capsule Take 10 mg by mouth every morning.   Yes Historical Provider, MD  furosemide (LASIX) 40 MG tablet Take 40 mg by mouth daily.   Yes Historical Provider, MD  gabapentin (NEURONTIN) 300 MG capsule Take 300 mg by mouth at bedtime.   Yes Historical Provider, MD  glipiZIDE (GLUCOTROL) 10 MG tablet Take 10 mg by mouth 2 (two) times daily.   Yes Historical Provider, MD  isosorbide mononitrate (IMDUR) 60 MG 24 hr tablet Take 1 tablet (60 mg  total) by mouth daily. 02/01/15  Yes Satira Sark, MD  nitroGLYCERIN (NITROSTAT) 0.4 MG SL tablet Place 1 tablet (0.4 mg total) under the tongue every 5 (five) minutes as needed for chest pain. 12/18/14  Yes Satira Sark, MD  omeprazole (PRILOSEC) 40 MG capsule Take 40 mg by mouth daily.   Yes Historical Provider, MD  ranitidine (ZANTAC) 300 MG tablet Take 300 mg by mouth every morning.    Yes Historical Provider, MD  solifenacin (VESICARE) 5 MG tablet Take 5 mg by mouth daily.   Yes Historical Provider, MD  tamsulosin (FLOMAX) 0.4 MG CAPS capsule Take 1 capsule (0.4 mg total) by mouth daily after supper. 05/25/14  Yes Gina L Collins, PA-C  valsartan (DIOVAN) 40 MG tablet Take 20 mg by mouth daily.    Yes Historical Provider, MD  albuterol (PROVENTIL) (2.5 MG/3ML) 0.083% nebulizer solution Take 3 mLs (2.5 mg total) by nebulization every 3 (three) hours as needed for wheezing or shortness of breath. 02/27/14   Nita Sells, MD  ipratropium-albuterol (DUONEB) 0.5-2.5 (3) MG/3ML SOLN Take 3 mLs by nebulization every 6 (six) hours as needed (Shortness of breath).    Historical Provider, MD   Physical Exam: Filed Vitals:   02/28/15 1045 02/28/15 1218 02/28/15 1230 02/28/15 1330  BP: 126/54 111/47 108/46 111/58  Pulse: 64   66  Temp:      TempSrc:      Resp: 21 13 16    Weight:      SpO2: 92%   93%   temperature: 98.90F.   General exam: Moderately built and obese male patient, lying comfortably supine on the gurney in no obvious distress.  Head, eyes and ENT: Nontraumatic and normocephalic. Pupils equally reacting to light and accommodation. Oral mucosa moist.  Neck: Supple. No JVD, carotid bruit or thyromegaly.  Lymphatics: No lymphadenopathy.  Respiratory system: Slightly diminished breath sounds in the bases but no wheezing, rhonchi or obvious crackles. No increased work of breathing.  Cardiovascular system: S1 and S2 heard, RRR. No JVD, murmurs, gallops, clicks. Trace  bilateral leg edema.  Gastrointestinal system: Abdomen is nondistended, soft and nontender. Normal bowel sounds heard. No organomegaly or masses appreciated.  Central nervous system: Alert and oriented. No focal neurological deficits.  Extremities: Symmetric 5 x 5 power. Peripheral pulses symmetrically well felt.   Skin: No rashes or acute findings.  Musculoskeletal system: Negative exam.  Psychiatry:  Pleasant and cooperative.   Labs on Admission:  Basic Metabolic Panel:  Recent Labs Lab 02/28/15 1207  NA 136  K 4.2  CL 100*  GLUCOSE 172*  BUN 17  CREATININE 1.80*   Liver Function Tests: No results for input(s): AST, ALT, ALKPHOS, BILITOT, PROT, ALBUMIN in the last 168 hours. No results for input(s): LIPASE, AMYLASE in the last 168 hours. No results for input(s): AMMONIA in the last 168 hours. CBC:  Recent Labs Lab 02/28/15 1135 02/28/15 1207  WBC 8.8  --   NEUTROABS 6.9  --   HGB 12.6* 13.3  HCT 38.6* 39.0  MCV 86.5  --   PLT 176  --    Cardiac Enzymes: No results for input(s): CKTOTAL, CKMB, CKMBINDEX, TROPONINI in the last 168 hours.  BNP (last 3 results)  Recent Labs  05/19/14 0420 05/22/14 0308 05/23/14 0300  PROBNP 598.8* 297.7* 221.3*   CBG: No results for input(s): GLUCAP in the last 168 hours.  Radiological Exams on Admission: Dg Chest 2 View  02/28/2015   CLINICAL DATA:  Syncope, chest pain for 2 hours  EXAM: CHEST  2 VIEW  COMPARISON:  Chest x-ray of 01/09/2015  FINDINGS: The lungs remain hyperaerated with flattened hemidiaphragms consistent with a degree of emphysema. Somewhat prominent interstitial markings appear chronic and stable. Mediastinal and hilar contours are unremarkable. Mild cardiomegaly is stable. No bony abnormality is seen.  IMPRESSION: No active lung disease. Probable emphysema and mild chronic interstitial change.   Electronically Signed   By: Ivar Drape M.D.   On: 02/28/2015 11:54    EKG: Independently reviewed. Sinus  rhythm, LAD and no acute changes. QTC 427 ms.  Assessment/Plan Principal Problem:   Chest pain Active Problems:   Essential hypertension, benign   Diabetes mellitus type 2, uncontrolled, with cardiovascular complications   Coronary atherosclerosis of native coronary artery   Chronic respiratory failure with hypoxia   COLD (chronic obstructive lung disease)   ILD (interstitial lung disease)   Obstructive sleep apnea   HLD (hyperlipidemia)   Right leg pain   Atypical chest pain   Syncope   1. Atypical chest pain: Resolved. EKG without acute changes. Troponin 1 and d-dimer negative. Admit to telemetry for observation. Cycle troponins. Continue Plavix. Review with cardiology in a.m. to consider need for any further ischemic workup-inpatient versus outpatient. Will check UDS. 2. Syncope:? Cough syncope. States that he feels dizzy during episodes of coughing intermittently. Will check orthostatic blood pressures. Monitor on telemetry. 2-D echo 02/25/14 showed LVEF 65-70 percent. Carotid Dopplers 3/16 showed known left ICA occlusion and right ICA 80-99% stenosis-status post right carotid endarterectomy 10/08/14 by Dr. [name] Early-consider discussing with Dr. Tawni Millers regarding carotid disease as cause for syncope. 3. Essential hypertension: Controlled. Continue home medications 4. Type II DM with renal complications: SSI. 5. CAD status post stent: Management per problem #1. Continue Plavix, nitrates, statins. 6. Chronic respiratory failure with hypoxia on home oxygen 6 L/m, OSA-not on nightly CPAP, COPD & ILD: Stable. Continue oxygen support. Outpatient follow-up with pulmonology who apparently are considering high resolution CT for further evaluation. 7. HLD: Statins 8. Right leg pain: May be related to arthritis. Good peripheral pulses. DVT ruled out by recent venous Doppler. 9. Stage III chronic kidney disease: Creatinine close to baseline. 10. Anxiety & depression: Continue home medications. No  suicidal or homicidal ideations. 11. GERD: Continue PPI and H2 blockers. 12. Bilateral carotid disease: Management per problem #2.    DVT prophylaxis: Lovenox  Code Status:  DO NOT RESUSCITATE-confirmed with patient in the presence of his mother at bedside.  Family Communication: Discussed with patient's mother at bedside.  Disposition Plan: DC home possibly 03/01/15.   Time spent: 60 minutes.   Vernell Leep, MD, FACP, FHM. [facility] Pager [phone]  If 7PM-7AM, please contact night-coverage [email or web address] Password [name] 02/28/2015, 2:29 PM

## 2015-02-28 NOTE — ED Provider Notes (Signed)
CSN: [id number]     Arrival date & time 02/28/15  1023 History   First MD Initiated Contact with Patient 02/28/15 1035     Chief Complaint  Patient presents with  . Chest Pain     (Consider location/radiation/quality/duration/timing/severity/associated sxs/prior Treatment) Patient is a 59 y.o. male presenting with chest pain. The history is provided by the patient.  Chest Pain Associated symptoms: cough and shortness of breath   Associated symptoms: no abdominal pain, no back pain, no diaphoresis, no headache, no nausea, no numbness, not vomiting and no weakness    patient presents after chest pain and syncope. He has had some worsening shortness of breath over the last couple weeks. He's been seen by pulmonology and negative Dopplers right leg. Today he was getting pulmonary function testing and had a syncopal episode with it. States he began to cough and then passed out. He had some chest pain in his mid chest. Felt somewhat like his previous chest pain, but states it did not go to the right arm. Feels somewhat better now. Was due to get a high-resolution CT scan to evaluate his interstitial lung disease also. Patient had minimal hypoxia and was not very hypotensive.   Past Medical History  Diagnosis Date  . Essential hypertension, benign   . History of stroke     Right MCA distribution, residual left-sided weakness  . Noncompliance   . COPD (chronic obstructive pulmonary disease)   . Hyperlipidemia   . History of pneumonia   . Type 2 diabetes mellitus 2011  . GERD (gastroesophageal reflux disease)   . Cocaine abuse   . Cholecystitis, acute 12/20/2013    Lap chole 6/5  . Coronary atherosclerosis of native coronary artery     a. 03/09/2013 Cath/PCI: LM nl, LAD: 50p, 67m (2.5x16 promus DES), LCX nl, OM1 min irregs, LPL/LPDA diff dzs, RCA nondom, mod diff dzs, EF 55%.  . Depression   . Anxiety   . History of nephrolithiasis   . Arthritis   . Hepatitis Late 1970s  . Sleep apnea    On CPAP, 4L O2 no cpap at home yet  . Renal cell carcinoma     Status post radical right nephrectomy August 2015  . Headache    Past Surgical History  Procedure Laterality Date  . Appendectomy  1970's  . Cholecystectomy N/A 12/22/2013    Procedure: LAPAROSCOPIC CHOLECYSTECTOMY ;  Surgeon: [name], MD;  Location: [place];  Service: General;  Laterality: N/A;  . Robot assisted laparoscopic nephrectomy Right 02/21/2014    Procedure: ROBOTIC ASSISTED LAPAROSCOPIC RIGHT NEPHRECTOMY ;  Surgeon: [name], MD;  Location: [facility];  Service: Urology;  Laterality: Right;  . Laparoscopic lysis of adhesions  02/21/2014    Procedure: LAPAROSCOPIC LYSIS OF ADHESIONS EXTINSIVE;  Surgeon: [name], MD;  Location: [facility];  Service: Urology;;  . Video assisted thoracoscopy Left 05/18/2014    Procedure: LEFT VIDEO ASSISTED THORACOSCOPY;  Surgeon: [name], MD;  Location: [place];  Service: Thoracic;  Laterality: Left;  . Lung biopsy Left 05/18/2014    Procedure: LUNG BIOPSY left upper lobe & left lower lobe;  Surgeon: [name], MD;  Location: [place];  Service: Thoracic;  Laterality: Left;  . Video bronchoscopy  05/18/2014    Procedure: VIDEO BRONCHOSCOPY;  Surgeon: [name], MD;  Location: [place];  Service: Thoracic;;  . Left heart catheterization with coronary angiogram N/A 06/02/2012    Procedure: LEFT HEART CATHETERIZATION WITH CORONARY ANGIOGRAM;  Surgeon: [name], MD;  Location: [facility] CATH LAB;  Service: Cardiovascular;  Laterality: N/A;  . Left heart catheterization with coronary angiogram N/A 03/09/2013    Procedure: LEFT HEART CATHETERIZATION WITH CORONARY ANGIOGRAM;  Surgeon: [name], MD;  Location: [facility] CATH LAB;  Service: Cardiovascular;  Laterality: N/A;  . Left heart catheterization with coronary angiogram N/A 12/20/2013    Procedure: LEFT HEART CATHETERIZATION WITH CORONARY ANGIOGRAM;  Surgeon: [name], MD;  Location: [facility] CATH  LAB;  Service: Cardiovascular;  Laterality: N/A;  . Endarterectomy Right 10/08/2014    Procedure: Right ENDARTERECTOMY CAROTID;  Surgeon: [name], MD;  Location: [place];  Service: Vascular;  Laterality: Right;  . Patch angioplasty Right 10/08/2014    Procedure: PATCH ANGIOPLASTY Right Carotid;  Surgeon: [name], MD;  Location: [facility] OR;  Service: Vascular;  Laterality: Right;   Family History  Problem Relation Age of Onset  . Hypertension    . Diabetes    . Stroke    . Lung cancer Father   . Cancer Mother     Thyroid - living in her 3's.  . Cancer Maternal Grandmother     Breast  . Cancer Maternal Grandfather     Throat and stomach  . CAD Father   . CAD Brother    Social History  Substance Use Topics  . Smoking status: Current Every Day Smoker -- 0.50 packs/day for 41 years    Types: Cigarettes    Start date: 05/03/1972  . Smokeless tobacco: Never Used     Comment: smokes 1/2 pack daily  . Alcohol Use: No    Review of Systems  Constitutional: Negative for diaphoresis, activity change and appetite change.  Eyes: Negative for pain.  Respiratory: Positive for cough and shortness of breath. Negative for chest tightness.   Cardiovascular: Positive for chest pain. Negative for leg swelling.  Gastrointestinal: Negative for nausea, vomiting, abdominal pain and diarrhea.  Genitourinary: Negative for flank pain.  Musculoskeletal: Negative for back pain and neck stiffness.  Skin: Negative for rash.  Neurological: Positive for syncope. Negative for weakness, light-headedness, numbness and headaches.  Psychiatric/Behavioral: Negative for behavioral problems.      Allergies  Oxycodone  Home Medications   Prior to Admission medications   Medication Sig Start Date End Date Taking? Authorizing Provider  albuterol (PROVENTIL HFA;VENTOLIN HFA) 108 (90 BASE) MCG/ACT inhaler Inhale 2 puffs into the lungs every 6 (six) hours as needed for wheezing or shortness of breath.   Yes  Historical Provider, MD  alfuzosin (UROXATRAL) 10 MG 24 hr tablet Take 10 mg by mouth daily with breakfast.   Yes Historical Provider, MD  ALPRAZolam (XANAX) 0.25 MG tablet Take 0.25 mg by mouth 2 (two) times daily as needed for anxiety.   Yes Historical Provider, MD  atorvastatin (LIPITOR) 20 MG tablet Take 20 mg by mouth daily.   Yes Historical Provider, MD  budesonide-formoterol (SYMBICORT) 160-4.5 MCG/ACT inhaler Inhale 2 puffs into the lungs 2 (two) times daily. 05/25/14  Yes Coolidge Breeze, PA-C  cholecalciferol (VITAMIN D) 1000 UNITS tablet Take 2,000 Units by mouth daily.   Yes Historical Provider, MD  citalopram (CELEXA) 20 MG tablet Take 40 mg by mouth daily.    Yes Historical Provider, MD  clopidogrel (PLAVIX) 75 MG tablet Take 1 tablet (75 mg total) by mouth daily. 09/26/14  Yes Cameron Sprang, MD  diltiazem (CARDIZEM CD) 240 MG 24 hr capsule Take 240 mg by mouth daily.   Yes  Historical Provider, MD  Fish Oil-Cholecalciferol (FISH OIL + D3 PO) Take 1 capsule by mouth 4 (four) times daily.    Yes Historical Provider, MD  FLUoxetine (PROZAC) 10 MG capsule Take 10 mg by mouth every morning.   Yes Historical Provider, MD  furosemide (LASIX) 40 MG tablet Take 40 mg by mouth daily.   Yes Historical Provider, MD  gabapentin (NEURONTIN) 300 MG capsule Take 300 mg by mouth at bedtime.   Yes Historical Provider, MD  glipiZIDE (GLUCOTROL) 10 MG tablet Take 10 mg by mouth 2 (two) times daily.   Yes Historical Provider, MD  isosorbide mononitrate (IMDUR) 60 MG 24 hr tablet Take 1 tablet (60 mg total) by mouth daily. 02/01/15  Yes Satira Sark, MD  nitroGLYCERIN (NITROSTAT) 0.4 MG SL tablet Place 1 tablet (0.4 mg total) under the tongue every 5 (five) minutes as needed for chest pain. 12/18/14  Yes Satira Sark, MD  omeprazole (PRILOSEC) 40 MG capsule Take 40 mg by mouth daily.   Yes Historical Provider, MD  ranitidine (ZANTAC) 300 MG tablet Take 300 mg by mouth every morning.    Yes Historical  Provider, MD  solifenacin (VESICARE) 5 MG tablet Take 5 mg by mouth daily.   Yes Historical Provider, MD  tamsulosin (FLOMAX) 0.4 MG CAPS capsule Take 1 capsule (0.4 mg total) by mouth daily after supper. 05/25/14  Yes Gina L Collins, PA-C  valsartan (DIOVAN) 40 MG tablet Take 20 mg by mouth daily.    Yes Historical Provider, MD  albuterol (PROVENTIL) (2.5 MG/3ML) 0.083% nebulizer solution Take 3 mLs (2.5 mg total) by nebulization every 3 (three) hours as needed for wheezing or shortness of breath. 02/27/14   Nita Sells, MD  ipratropium-albuterol (DUONEB) 0.5-2.5 (3) MG/3ML SOLN Take 3 mLs by nebulization every 6 (six) hours as needed (Shortness of breath).    Historical Provider, MD  nicotine (NICODERM CQ) 14 mg/24hr patch Place 1 patch (14 mg total) onto the skin daily. 03/01/15   Nishant Dhungel, MD   BP 116/78 mmHg  Pulse 75  Temp(Src) 97.8 F (36.6 C) (Oral)  Resp 20  Ht 5\' 9"  (1.753 m)  Wt 258 lb 3.2 oz (117.119 kg)  BMI 38.11 kg/m2  SpO2 92% Physical Exam  Constitutional: He is oriented to person, place, and time. He appears well-developed and well-nourished.  HENT:  Head: Normocephalic and atraumatic.  Eyes: Pupils are equal, round, and reactive to light.  Neck: Normal range of motion.  Cardiovascular: Normal rate, regular rhythm and normal heart sounds.   No murmur heard. Pulmonary/Chest: Effort normal.  Diffuse harsh breath sounds with prolonged expirations.  Abdominal: Soft. Bowel sounds are normal. He exhibits no distension.  Musculoskeletal: Normal range of motion. He exhibits no edema.  Neurological: He is alert and oriented to person, place, and time.  Skin: Skin is warm and dry.  Nursing note and vitals reviewed.   ED Course  Procedures (including critical care time) Labs Review Labs Reviewed  CBC WITH DIFFERENTIAL/PLATELET - Abnormal; Notable for the following:    Hemoglobin 12.6 (*)    HCT 38.6 (*)    Neutrophils Relative % 80 (*)    All other  components within normal limits  URINALYSIS, ROUTINE W REFLEX MICROSCOPIC (NOT AT [facility]) - Abnormal; Notable for the following:    APPearance CLOUDY (*)    All other components within normal limits  URINE RAPID DRUG SCREEN, HOSP PERFORMED - Abnormal; Notable for the following:    Opiates POSITIVE (*)  All other components within normal limits  GLUCOSE, CAPILLARY - Abnormal; Notable for the following:    Glucose-Capillary 175 (*)    All other components within normal limits  BASIC METABOLIC PANEL - Abnormal; Notable for the following:    Chloride 100 (*)    Glucose, Bld 190 (*)    Creatinine, Ser 1.84 (*)    GFR calc non Af Amer 38 (*)    GFR calc Af Amer 45 (*)    All other components within normal limits  CBC - Abnormal; Notable for the following:    Hemoglobin 12.3 (*)    HCT 37.8 (*)    All other components within normal limits  GLUCOSE, CAPILLARY - Abnormal; Notable for the following:    Glucose-Capillary 213 (*)    All other components within normal limits  GLUCOSE, CAPILLARY - Abnormal; Notable for the following:    Glucose-Capillary 194 (*)    All other components within normal limits  GLUCOSE, CAPILLARY - Abnormal; Notable for the following:    Glucose-Capillary 261 (*)    All other components within normal limits  I-STAT CHEM 8, ED - Abnormal; Notable for the following:    Chloride 100 (*)    Creatinine, Ser 1.80 (*)    Glucose, Bld 172 (*)    All other components within normal limits  D-DIMER, QUANTITATIVE (NOT AT [facility])  TROPONIN I  TROPONIN I  TROPONIN I  I-STAT TROPOININ, ED    Imaging Review No results found.   EKG Interpretation   Date/Time:  Thursday February 28 2015 10:19:27 EDT Ventricular Rate:  65 PR Interval:  204 QRS Duration: 91 QT Interval:  411 QTC Calculation: 427 R Axis:   -29 Text Interpretation:  Sinus rhythm Borderline prolonged PR interval  Borderline left axis deviation Low voltage, extremity leads Abnormal  R-wave progression,  late transition Confirmed by Alvino Chapel  MD, Ovid Curd  [phone]) on 02/28/2015 12:38:59 PM      MDM   Final diagnoses:  Chest pain, unspecified chest pain type  Syncope, unspecified syncope type    Patient with syncope. Began her pulmonary function test. Did not have severe hypoxia that time. Feels somewhat better now. Will admit to internal medicine.    Davonna Belling, MD 03/04/15 [phone]

## 2015-03-01 ENCOUNTER — Telehealth: Payer: Self-pay | Admitting: *Deleted

## 2015-03-01 DIAGNOSIS — Z720 Tobacco use: Secondary | ICD-10-CM | POA: Diagnosis not present

## 2015-03-01 DIAGNOSIS — R05 Cough: Secondary | ICD-10-CM | POA: Diagnosis not present

## 2015-03-01 DIAGNOSIS — R55 Syncope and collapse: Secondary | ICD-10-CM | POA: Diagnosis not present

## 2015-03-01 DIAGNOSIS — R0789 Other chest pain: Secondary | ICD-10-CM

## 2015-03-01 LAB — CBC
HCT: 37.8 % — ABNORMAL LOW (ref 39.0–52.0)
HEMOGLOBIN: 12.3 g/dL — AB (ref 13.0–17.0)
MCH: 28 pg (ref 26.0–34.0)
MCHC: 32.5 g/dL (ref 30.0–36.0)
MCV: 86.1 fL (ref 78.0–100.0)
Platelets: 157 10*3/uL (ref 150–400)
RBC: 4.39 MIL/uL (ref 4.22–5.81)
RDW: 13.9 % (ref 11.5–15.5)
WBC: 6.8 10*3/uL (ref 4.0–10.5)

## 2015-03-01 LAB — GLUCOSE, CAPILLARY
Glucose-Capillary: 194 mg/dL — ABNORMAL HIGH (ref 65–99)
Glucose-Capillary: 261 mg/dL — ABNORMAL HIGH (ref 65–99)

## 2015-03-01 LAB — BASIC METABOLIC PANEL
Anion gap: 8 (ref 5–15)
BUN: 14 mg/dL (ref 6–20)
CO2: 29 mmol/L (ref 22–32)
Calcium: 9 mg/dL (ref 8.9–10.3)
Chloride: 100 mmol/L — ABNORMAL LOW (ref 101–111)
Creatinine, Ser: 1.84 mg/dL — ABNORMAL HIGH (ref 0.61–1.24)
GFR calc Af Amer: 45 mL/min — ABNORMAL LOW (ref >60)
GFR calc non Af Amer: 38 mL/min — ABNORMAL LOW (ref >60)
GLUCOSE: 190 mg/dL — AB (ref 65–99)
POTASSIUM: 4.3 mmol/L (ref 3.5–5.1)
Sodium: 137 mmol/L (ref 135–145)

## 2015-03-01 LAB — TROPONIN I
Troponin I: <0.03 ng/mL (ref <0.031)
Troponin I: <0.03 ng/mL (ref <0.031)

## 2015-03-01 MED ORDER — IPRATROPIUM-ALBUTEROL 0.5-2.5 (3) MG/3ML IN SOLN: 3.0000 mL | Freq: Three times a day (TID) | RESPIRATORY_TRACT | Status: DC

## 2015-03-01 MED ORDER — NICOTINE 14 MG/24HR TD PT24: 14.0000 mg | MEDICATED_PATCH | Freq: Every day | TRANSDERMAL | Status: DC

## 2015-03-01 NOTE — Discharge Summary (Signed)
Physician Discharge Summary  [name] HWE:[id number] DOB: 1955-10-17 DOA: 02/28/2015  PCP: [name], BETTY G, MD  Admit date: 02/28/2015 Discharge date: 03/01/2015  Time spent: 25 minutes  Recommendations for Outpatient Follow-up:  1. Discharge home with outpatient PCP and pulmonary follow-up. Has follow-up with his cardiologist in 1 month.  Discharge Diagnoses:  Principal Problem:   Atypical chest pain  Active Problems:  cough syncope   Essential hypertension, benign   Diabetes mellitus type 2, uncontrolled, with cardiovascular complications   Tobacco abuse   Coronary atherosclerosis of native coronary artery   Chronic respiratory failure with hypoxia   COLD (chronic obstructive lung disease)   ILD (interstitial lung disease)   Obstructive sleep apnea   HLD (hyperlipidemia)   Right leg pain     Discharge Condition: fair  Diet recommendation:diabetic/cardiac  Filed Weights   02/28/15 1029 02/28/15 1604 03/01/15 0500  Weight: 118.389 kg (261 lb) 116.937 kg (257 lb 12.8 oz) 117.119 kg (258 lb 3.2 oz)    History of present illness:  Please refer to admission H&P for details, in brief, 59 y.o. male with extensive PMH-chronic respiratory failure on home oxygen 6 L/m, COPD, OSA-not on nightly C Pap, ILD, CAD status post stent 2 years ago, ongoing tobacco abuse, HTN, HLD, DM 2 with renal complications, stage III chronic kidney disease, GERD, anxiety & depression, transferred from pulmonologist's office to [facility] ED on 02/28/15 due to above complaints. Patient was there to perform PFTs. He took a deep breath and blew into the machine. He then was asked to hold his breath and repeat the process. However he started violently coughing and then he states that he does not remember anything. Duration of LOC is not clear but seemed brief period his oxygen saturations at this time were 92-94 percent, pulse in the 70s. Patient was taken by wheelchair to exam room and then he became  unresponsive again and was gasping for air. He then started complaining of anterior chest pain, pressure-like, "felt like might chest was going to explode", nonradiating, not associated with nausea, vomiting or diaphoresis. He was given a dose of sublingual nitroglycerin and states that shortly thereafter his chest pain completely resolved. He denies any chest pain at this time. He states that he has chronic dyspnea which has not changed. He has intermittent dry coughing spells associated with dizziness. He also has 3 weeks history of right lower extremity pain and 2 venous Dopplers apparently were negative for DVT. In the ED, lab work significant for creatinine 1.8, negative d-dimer and troponin 1 and chest x-ray shows no acute lung disease but probable emphysema and mild chronic interstitial change. Hospitalist admission was requested.  Hospital Course:   Atypical chest pain:  Resolved. EKG without acute changes. Serial cardiac enzymes negative. Possibly musculoskeletal . No further  workup as per cardiology.patient will follow-up with his cardiologist in one month  Cough syncope . 2-D echo 02/25/14 showed LVEF 65-70 percent. Carotid Dopplers 3/16 showed known left ICA occlusion and right ICA 80-99% stenosis-status post right carotid endarterectomy 10/08/14 by Dr. Curt Jews.stable on telemetry. No further recommendations per cardiology.  Essential hypertension:  Controlled. Continue home medications  Type II DM with renal complications:  Stable. Continue home medications   CAD status post stent:  Continue Plavix, nitrates, statins.  Chronic respiratory failure with hypoxia on home oxygen 6 L/m,OSA -not on nightly CPAP,  COPD & ILD:  Stable. Continue oxygen support. Outpatient follow-up with pulmonology who apparently are considering high resolution CT for  further evaluation.counseled on smoking cessation    Right leg pain:  possibly related to arthritis. Good peripheral pulses. DVT  ruled out by recent venous Doppler.  Stage III chronic kidney disease:  Creatinine around baseline   Anxiety & depression:  Continue home medications.  Tobacco abuse  counseled strongly on cessation. Agrees with nicotine patch. Prescribed   CODE STATUS: Full code family communication: Brother at bedside  Procedures:  none  Consultations:  cardiology  Discharge Exam: Filed Vitals:   03/01/15 0500  BP:   Pulse: 75  Temp: 97.8 F (36.6 C)  Resp: 20    General: middle aged obese male in no distress HEENT: No pallor, moist oral mucosa Chest: Clear to auscultation bilaterally, no added sounds   CVS: Normal S1 and S2, no murmurs rub or gallop GI: Soft, nondistended, nontender, bowel sounds present Musculoskeletal: Warm, no edema CNS: Alert and oriented  Discharge Instructions    Current Discharge Medication List    START taking these medications   Details  nicotine (NICODERM CQ) 14 mg/24hr patch Place 1 patch (14 mg total) onto the skin daily. Qty: 28 patch, Refills: 0      CONTINUE these medications which have NOT CHANGED   Details  albuterol (PROVENTIL HFA;VENTOLIN HFA) 108 (90 BASE) MCG/ACT inhaler Inhale 2 puffs into the lungs every 6 (six) hours as needed for wheezing or shortness of breath.    alfuzosin (UROXATRAL) 10 MG 24 hr tablet Take 10 mg by mouth daily with breakfast.    ALPRAZolam (XANAX) 0.25 MG tablet Take 0.25 mg by mouth 2 (two) times daily as needed for anxiety.    atorvastatin (LIPITOR) 20 MG tablet Take 20 mg by mouth daily.    budesonide-formoterol (SYMBICORT) 160-4.5 MCG/ACT inhaler Inhale 2 puffs into the lungs 2 (two) times daily. Qty: 1 Inhaler, Refills: 3    cholecalciferol (VITAMIN D) 1000 UNITS tablet Take 2,000 Units by mouth daily.    citalopram (CELEXA) 20 MG tablet Take 40 mg by mouth daily.     clopidogrel (PLAVIX) 75 MG tablet Take 1 tablet (75 mg total) by mouth daily. Qty: 30 tablet, Refills: 11    diltiazem  (CARDIZEM CD) 240 MG 24 hr capsule Take 240 mg by mouth daily.    Fish Oil-Cholecalciferol (FISH OIL + D3 PO) Take 1 capsule by mouth 4 (four) times daily.     FLUoxetine (PROZAC) 10 MG capsule Take 10 mg by mouth every morning.    furosemide (LASIX) 40 MG tablet Take 40 mg by mouth daily.    gabapentin (NEURONTIN) 300 MG capsule Take 300 mg by mouth at bedtime.    glipiZIDE (GLUCOTROL) 10 MG tablet Take 10 mg by mouth 2 (two) times daily.    isosorbide mononitrate (IMDUR) 60 MG 24 hr tablet Take 1 tablet (60 mg total) by mouth daily. Qty: 90 tablet, Refills: 3    nitroGLYCERIN (NITROSTAT) 0.4 MG SL tablet Place 1 tablet (0.4 mg total) under the tongue every 5 (five) minutes as needed for chest pain. Qty: 25 tablet, Refills: 3    omeprazole (PRILOSEC) 40 MG capsule Take 40 mg by mouth daily.    ranitidine (ZANTAC) 300 MG tablet Take 300 mg by mouth every morning.     solifenacin (VESICARE) 5 MG tablet Take 5 mg by mouth daily.    tamsulosin (FLOMAX) 0.4 MG CAPS capsule Take 1 capsule (0.4 mg total) by mouth daily after supper. Qty: 30 capsule, Refills: 1    valsartan (DIOVAN) 40 MG tablet  Take 20 mg by mouth daily.     albuterol (PROVENTIL) (2.5 MG/3ML) 0.083% nebulizer solution Take 3 mLs (2.5 mg total) by nebulization every 3 (three) hours as needed for wheezing or shortness of breath. Qty: 75 mL, Refills: 12    ipratropium-albuterol (DUONEB) 0.5-2.5 (3) MG/3ML SOLN Take 3 mLs by nebulization every 6 (six) hours as needed (Shortness of breath).       Allergies  Allergen Reactions  . Oxycodone Itching and Nausea Only   Follow-up Information    Follow up with [name], [name], MD In 2 weeks.   Specialty:  Family Medicine   Contact information:   [address] [place] [address] [phone]        The results of significant diagnostics from this hospitalization (including imaging, microbiology, ancillary and laboratory) are listed below for reference.     Significant Diagnostic Studies: Dg Chest 2 View  02/28/2015   CLINICAL DATA:  Syncope, chest pain for 2 hours  EXAM: CHEST  2 VIEW  COMPARISON:  Chest x-ray of 01/09/2015  FINDINGS: The lungs remain hyperaerated with flattened hemidiaphragms consistent with a degree of emphysema. Somewhat prominent interstitial markings appear chronic and stable. Mediastinal and hilar contours are unremarkable. Mild cardiomegaly is stable. No bony abnormality is seen.  IMPRESSION: No active lung disease. Probable emphysema and mild chronic interstitial change.   Electronically Signed   By: Ivar Drape M.D.   On: 02/28/2015 11:54    Microbiology: No results found for this or any previous visit (from the past 240 hour(s)).   Labs: Basic Metabolic Panel:  Recent Labs Lab 02/28/15 1207 03/01/15 0351  NA 136 137  K 4.2 4.3  CL 100* 100*  CO2  --  29  GLUCOSE 172* 190*  BUN 17 14  CREATININE 1.80* 1.84*  CALCIUM  --  9.0   Liver Function Tests: No results for input(s): AST, ALT, ALKPHOS, BILITOT, PROT, ALBUMIN in the last 168 hours. No results for input(s): LIPASE, AMYLASE in the last 168 hours. No results for input(s): AMMONIA in the last 168 hours. CBC:  Recent Labs Lab 02/28/15 1135 02/28/15 1207 03/01/15 0351  WBC 8.8  --  6.8  NEUTROABS 6.9  --   --   HGB 12.6* 13.3 12.3*  HCT 38.6* 39.0 37.8*  MCV 86.5  --  86.1  PLT 176  --  157   Cardiac Enzymes:  Recent Labs Lab 02/28/15 1653 03/01/15 0022 03/01/15 0351  TROPONINI <0.03 <0.03 <0.03   BNP: BNP (last 3 results) No results for input(s): BNP in the last 8760 hours.  ProBNP (last 3 results)  Recent Labs  05/19/14 0420 05/22/14 0308 05/23/14 0300  PROBNP 598.8* 297.7* 221.3*    CBG:  Recent Labs Lab 02/28/15 1610 02/28/15 2206 03/01/15 0737  GLUCAP 175* 213* 194*       Signed:  Dasiah Hooley  [facility] 03/01/2015, 11:00 AM

## 2015-03-01 NOTE — Telephone Encounter (Signed)
CHART OPENED IN ERROR

## 2015-03-01 NOTE — Discharge Instructions (Signed)
Nicotine Addiction Nicotine can act as both a stimulant (excites/activates) and a sedative (calms/quiets). Immediately after exposure to nicotine, there is a "kick" caused in part by the drug's stimulation of the adrenal glands and resulting discharge of adrenaline (epinephrine). The rush of adrenaline stimulates the body and causes a sudden release of sugar. This means that smokers are always slightly hyperglycemic. Hyperglycemic means that the blood sugar is high, just like in diabetics. Nicotine also decreases the amount of insulin which helps control sugar levels in the body. There is an increase in blood pressure, breathing, and the rate of heart beats.  In addition, nicotine indirectly causes a release of dopamine in the brain that controls pleasure and motivation. A similar reaction is seen with other drugs of abuse, such as cocaine and heroin. This dopamine release is thought to cause the pleasurable sensations when smoking. In some different cases, nicotine can also create a calming effect, depending on sensitivity of the smoker's nervous system and the dose of nicotine taken. WHAT HAPPENS WHEN NICOTINE IS TAKEN FOR LONG PERIODS OF TIME?  Long-term use of nicotine results in addiction. It is difficult to stop.  Repeated use of nicotine creates tolerance. Higher doses of nicotine are needed to get the "kick." When nicotine use is stopped, withdrawal may last a month or more. Withdrawal may begin within a few hours after the last cigarette. Symptoms peak within the first few days and may lessen within a few weeks. For some people, however, symptoms may last for months or longer. Withdrawal symptoms include:   Irritability.  Craving.  Learning and attention deficits.  Sleep disturbances.  Increased appetite. Craving for tobacco may last for 6 months or longer. Many behaviors done while using nicotine can also play a part in the severity of withdrawal symptoms. For some people, the feel,  smell, and sight of a cigarette and the ritual of obtaining, handling, lighting, and smoking the cigarette are closely linked with the pleasure of smoking. When stopped, they also miss the related behaviors which make the withdrawal or craving worse. While nicotine gum and patches may lessen the drug aspects of withdrawal, cravings often persist. WHAT ARE THE MEDICAL CONSEQUENCES OF NICOTINE USE?  Nicotine addiction accounts for one-third of all cancers. The top cancer caused by tobacco is lung cancer. Lung cancer is the number one cancer killer of both men and women.  Smoking is also associated with cancers of the:  Mouth.  Pharynx.  Larynx.  Esophagus.  Stomach.  Pancreas.  Cervix.  Kidney.  Ureter.  Bladder.  Smoking also causes lung diseases such as lasting (chronic) bronchitis and emphysema.  It worsens asthma in adults and children.  Smoking increases the risk of heart disease, including:  Stroke.  Heart attack.  Vascular disease.  Aneurysm.  Passive or secondary smoke can also increase medical risks including:  Asthma in children.  Sudden Infant Death Syndrome (SIDS).  Additionally, dropped cigarettes are the leading cause of residential fire fatalities.  Nicotine poisoning has been reported from accidental ingestion of tobacco products by children and pets. Death usually results in a few minutes from respiratory failure (when a person stops breathing) caused by paralysis. TREATMENT   Medication. Nicotine replacement medicines such as nicotine gum and the patch are used to stop smoking. These medicines gradually lower the dosage of nicotine in the body. These medicines do not contain the carbon monoxide and other toxins found in tobacco smoke.  Hypnotherapy.  Relaxation therapy.  Nicotine Anonymous (a 12-step support   program). Find times and locations in your local yellow pages. Document Released: 03/11/2004 Document Revised: 09/28/2011 Document  Reviewed: 09/01/2013 Exit[employer] Patient Information 2015 [employer]. This information is not intended to replace advice given to you by your health care provider. Make sure you discuss any questions you have with your health care provider.  

## 2015-03-01 NOTE — Consult Note (Signed)
CONSULT NOTE  Date: 03/01/2015               Patient Name:  [name] MRN: [id number]  DOB: 07-Nov-1955 Age / Sex: 59 y.o., male        PCP: [name], BETTY G Primary Cardiologist: [place]            Referring Physician: Dhungel              Reason for Consult: syncope           History of Present Illness: Patient is a 59 y.o. male with a PMHx of chronic respiratory failure-on home oxygen 6 L/m, COPD, obstructive sleep apnea-not on nightly CPAP (, interstitial lung disease, coronary artery disease-status post stenting 2 years ago, ongoing cigarette smoking, hypertension, hyperlipidemia, type 2 diabetes mellitus, diabetic nephropathy with stage III chronic kidney disease, who was admitted to [facility] on 02/28/2015 for evaluation of syncopal episode following coughing..   The patient has known severe lung disease. He has a history of coronary artery disease and has very rare episodes of angina-only when he exerts himself. He's followed by Dr. Domenic Polite and his coronary status has been stable.  Yesterday he was in his pulmonologist office. He had a very severe coughing attack and then briefly lost consciousness. He woke up and did not have any postictal symptoms. There was no residual chest pain. He does have chest pain if he coughs very severely.  His troponin levels of been negative. His EKG shows normal sinus rhythm. EKG is nonacute.  Medications: Outpatient medications: Prescriptions prior to admission  Medication Sig Dispense Refill Last Dose  . albuterol (PROVENTIL HFA;VENTOLIN HFA) 108 (90 BASE) MCG/ACT inhaler Inhale 2 puffs into the lungs every 6 (six) hours as needed for wheezing or shortness of breath.   02/28/2015 at Unknown time  . alfuzosin (UROXATRAL) 10 MG 24 hr tablet Take 10 mg by mouth daily with breakfast.   02/28/2015 at Unknown time  . ALPRAZolam (XANAX) 0.25 MG tablet Take 0.25 mg by mouth 2 (two) times daily as needed for anxiety.   02/27/2015 at Unknown time    . atorvastatin (LIPITOR) 20 MG tablet Take 20 mg by mouth daily.   02/28/2015 at Unknown time  . budesonide-formoterol (SYMBICORT) 160-4.5 MCG/ACT inhaler Inhale 2 puffs into the lungs 2 (two) times daily. 1 Inhaler 3 02/28/2015 at Unknown time  . cholecalciferol (VITAMIN D) 1000 UNITS tablet Take 2,000 Units by mouth daily.   02/28/2015 at Unknown time  . citalopram (CELEXA) 20 MG tablet Take 40 mg by mouth daily.    02/28/2015 at Unknown time  . clopidogrel (PLAVIX) 75 MG tablet Take 1 tablet (75 mg total) by mouth daily. 30 tablet 11 02/28/2015 at Unknown time  . diltiazem (CARDIZEM CD) 240 MG 24 hr capsule Take 240 mg by mouth daily.   02/28/2015 at Unknown time  . Fish Oil-Cholecalciferol (FISH OIL + D3 PO) Take 1 capsule by mouth 4 (four) times daily.    02/28/2015 at Unknown time  . FLUoxetine (PROZAC) 10 MG capsule Take 10 mg by mouth every morning.   02/28/2015 at Unknown time  . furosemide (LASIX) 40 MG tablet Take 40 mg by mouth daily.   02/28/2015 at Unknown time  . gabapentin (NEURONTIN) 300 MG capsule Take 300 mg by mouth at bedtime.   02/27/2015 at Unknown time  . glipiZIDE (GLUCOTROL) 10 MG tablet Take 10 mg by mouth 2 (two) times daily.   02/28/2015  at Unknown time  . isosorbide mononitrate (IMDUR) 60 MG 24 hr tablet Take 1 tablet (60 mg total) by mouth daily. 90 tablet 3 02/28/2015 at Unknown time  . nitroGLYCERIN (NITROSTAT) 0.4 MG SL tablet Place 1 tablet (0.4 mg total) under the tongue every 5 (five) minutes as needed for chest pain. 25 tablet 3 02/28/2015 at Unknown time  . omeprazole (PRILOSEC) 40 MG capsule Take 40 mg by mouth daily.   02/28/2015 at Unknown time  . ranitidine (ZANTAC) 300 MG tablet Take 300 mg by mouth every morning.    02/27/2015 at Unknown time  . solifenacin (VESICARE) 5 MG tablet Take 5 mg by mouth daily.   02/28/2015 at Unknown time  . tamsulosin (FLOMAX) 0.4 MG CAPS capsule Take 1 capsule (0.4 mg total) by mouth daily after supper. 30 capsule 1 02/28/2015 at Unknown  time  . valsartan (DIOVAN) 40 MG tablet Take 20 mg by mouth daily.    02/28/2015 at Unknown time  . albuterol (PROVENTIL) (2.5 MG/3ML) 0.083% nebulizer solution Take 3 mLs (2.5 mg total) by nebulization every 3 (three) hours as needed for wheezing or shortness of breath. 75 mL 12 unk  . ipratropium-albuterol (DUONEB) 0.5-2.5 (3) MG/3ML SOLN Take 3 mLs by nebulization every 6 (six) hours as needed (Shortness of breath).   unk    Current medications: Current Facility-Administered Medications  Medication Dose Route Frequency Provider Last Rate Last Dose  . acetaminophen (TYLENOL) tablet 650 mg  650 mg Oral Q6H PRN Modena Jansky, MD       Or  . acetaminophen (TYLENOL) suppository 650 mg  650 mg Rectal Q6H PRN Modena Jansky, MD      . albuterol (PROVENTIL) (2.5 MG/3ML) 0.083% nebulizer solution 2.5 mg  2.5 mg Nebulization Q3H PRN Modena Jansky, MD      . alfuzosin (UROXATRAL) 24 hr tablet 10 mg  10 mg Oral Q breakfast Modena Jansky, MD      . ALPRAZolam Duanne Moron) tablet 0.25 mg  0.25 mg Oral BID PRN Modena Jansky, MD      . antiseptic oral rinse (CPC / CETYLPYRIDINIUM CHLORIDE 0.05%) solution 7 mL  7 mL Mouth Rinse BID Modena Jansky, MD   7 mL at 02/28/15 2200  . atorvastatin (LIPITOR) tablet 20 mg  20 mg Oral Daily Modena Jansky, MD      . budesonide-formoterol (SYMBICORT) 160-4.5 MCG/ACT inhaler 2 puff  2 puff Inhalation BID Modena Jansky, MD   2 puff at 03/01/15 0750  . cholecalciferol (VITAMIN D) tablet 2,000 Units  2,000 Units Oral Daily Modena Jansky, MD      . citalopram (CELEXA) tablet 40 mg  40 mg Oral Daily Modena Jansky, MD      . clopidogrel (PLAVIX) tablet 75 mg  75 mg Oral Daily Modena Jansky, MD      . darifenacin (ENABLEX) 24 hr tablet 7.5 mg  7.5 mg Oral Daily Modena Jansky, MD      . diltiazem (CARDIZEM CD) 24 hr capsule 240 mg  240 mg Oral Daily Modena Jansky, MD      . enoxaparin (LOVENOX) injection 40 mg  40 mg Subcutaneous Q24H Modena Jansky, MD   40 mg at 02/28/15 2238  . famotidine (PEPCID) tablet 40 mg  40 mg Oral Daily Modena Jansky, MD      . FLUoxetine (PROZAC) capsule 10 mg  10 mg Oral q morning - 10a Lenis Dickinson  Hongalgi, MD      . furosemide (LASIX) tablet 40 mg  40 mg Oral Daily Modena Jansky, MD      . gabapentin (NEURONTIN) capsule 300 mg  300 mg Oral QHS Modena Jansky, MD   300 mg at 02/28/15 2237  . insulin aspart (novoLOG) injection 0-5 Units  0-5 Units Subcutaneous QHS Modena Jansky, MD   2 Units at 02/28/15 2237  . insulin aspart (novoLOG) injection 0-9 Units  0-9 Units Subcutaneous TID WC Modena Jansky, MD   2 Units at 02/28/15 1758  . ipratropium-albuterol (DUONEB) 0.5-2.5 (3) MG/3ML nebulizer solution 3 mL  3 mL Nebulization Q6H Modena Jansky, MD   3 mL at 03/01/15 0750  . irbesartan (AVAPRO) tablet 37.5 mg  37.5 mg Oral Daily Modena Jansky, MD      . isosorbide mononitrate (IMDUR) 24 hr tablet 60 mg  60 mg Oral Daily Modena Jansky, MD      . nitroGLYCERIN (NITROSTAT) SL tablet 0.4 mg  0.4 mg Sublingual Q5 min PRN Modena Jansky, MD      . pantoprazole (PROTONIX) EC tablet 80 mg  80 mg Oral Daily Modena Jansky, MD      . sodium chloride 0.9 % injection 3 mL  3 mL Intravenous Q12H Modena Jansky, MD   3 mL at 02/28/15 2200  . tamsulosin (FLOMAX) capsule 0.4 mg  0.4 mg Oral QPC supper Modena Jansky, MD         Allergies  Allergen Reactions  . Oxycodone Itching and Nausea Only     Past Medical History  Diagnosis Date  . Essential hypertension, benign   . History of stroke     Right MCA distribution, residual left-sided weakness  . Noncompliance   . COPD (chronic obstructive pulmonary disease)   . Hyperlipidemia   . History of pneumonia   . Type 2 diabetes mellitus 2011  . GERD (gastroesophageal reflux disease)   . Cocaine abuse   . Cholecystitis, acute 12/20/2013    Lap chole 6/5  . Coronary atherosclerosis of native coronary artery     a. 03/09/2013 Cath/PCI: LM  nl, LAD: 50p, 48m (2.5x16 promus DES), LCX nl, OM1 min irregs, LPL/LPDA diff dzs, RCA nondom, mod diff dzs, EF 55%.  . Depression   . Anxiety   . History of nephrolithiasis   . Arthritis   . Hepatitis Late 1970s  . Sleep apnea     On CPAP, 4L O2 no cpap at home yet  . Renal cell carcinoma     Status post radical right nephrectomy August 2015  . Headache     Past Surgical History  Procedure Laterality Date  . Appendectomy  1970's  . Cholecystectomy N/A 12/22/2013    Procedure: LAPAROSCOPIC CHOLECYSTECTOMY ;  Surgeon: [name], MD;  Location: [place];  Service: General;  Laterality: N/A;  . Robot assisted laparoscopic nephrectomy Right 02/21/2014    Procedure: ROBOTIC ASSISTED LAPAROSCOPIC RIGHT NEPHRECTOMY ;  Surgeon: [name], MD;  Location: [facility];  Service: Urology;  Laterality: Right;  . Laparoscopic lysis of adhesions  02/21/2014    Procedure: LAPAROSCOPIC LYSIS OF ADHESIONS EXTINSIVE;  Surgeon: [name], MD;  Location: [facility];  Service: Urology;;  . Video assisted thoracoscopy Left 05/18/2014    Procedure: LEFT VIDEO ASSISTED THORACOSCOPY;  Surgeon: [name], MD;  Location: [place];  Service: Thoracic;  Laterality: Left;  . Lung biopsy Left 05/18/2014  Procedure: LUNG BIOPSY left upper lobe & left lower lobe;  Surgeon: [name], MD;  Location: [place];  Service: Thoracic;  Laterality: Left;  . Video bronchoscopy  05/18/2014    Procedure: VIDEO BRONCHOSCOPY;  Surgeon: [name], MD;  Location: [place];  Service: Thoracic;;  . Left heart catheterization with coronary angiogram N/A 06/02/2012    Procedure: LEFT HEART CATHETERIZATION WITH CORONARY ANGIOGRAM;  Surgeon: [name], MD;  Location: [facility] CATH LAB;  Service: Cardiovascular;  Laterality: N/A;  . Left heart catheterization with coronary angiogram N/A 03/09/2013    Procedure: LEFT HEART CATHETERIZATION WITH CORONARY ANGIOGRAM;  Surgeon: [name], MD;  Location: [facility] CATH LAB;   Service: Cardiovascular;  Laterality: N/A;  . Left heart catheterization with coronary angiogram N/A 12/20/2013    Procedure: LEFT HEART CATHETERIZATION WITH CORONARY ANGIOGRAM;  Surgeon: [name], MD;  Location: [facility] C[facility] LAB;  Service: Cardiovascular;  Laterality: N/A;  . Endarterectomy Right 10/08/2014    Procedure: Right ENDARTERECTOMY CAROTID;  Surgeon: [name], MD;  Location: [place];  Service: Vascular;  Laterality: Right;  . Patch angioplasty Right 10/08/2014    Procedure: PATCH ANGIOPLASTY Right Carotid;  Surgeon: [name], MD;  Location: [facility] OR;  Service: Vascular;  Laterality: Right;    Family History  Problem Relation Age of Onset  . Hypertension    . Diabetes    . Stroke    . Lung cancer Father   . Cancer Mother     Thyroid - living in her 49's.  . Cancer Maternal Grandmother     Breast  . Cancer Maternal Grandfather     Throat and stomach  . CAD Father   . CAD Brother     Social History:  reports that he has been smoking Cigarettes.  He started smoking about 42 years ago. He has a 20.5 pack-year smoking history. He has never used smokeless tobacco. He reports that he does not drink alcohol or use illicit drugs.   Review of Systems: Constitutional:  denies fever, chills, diaphoresis, appetite change and fatigue.  HEENT: denies photophobia, eye pain, redness, hearing loss, ear pain, congestion, sore throat, rhinorrhea, sneezing, neck pain, neck stiffness and tinnitus.  Respiratory: admits to SOB, DOE, cough, chest tightness, and wheezing.  Cardiovascular: admits to  Occasional chest pain,   leg swelling.  Gastrointestinal: denies nausea, vomiting, abdominal pain, diarrhea, constipation, blood in stool.  Genitourinary: denies dysuria, urgency, frequency, hematuria, flank pain and difficulty urinating.  Musculoskeletal: denies  myalgias, back pain, joint swelling, arthralgias and gait problem.   Skin: denies pallor, rash and wound.  Neurological: denies  dizziness, seizures, syncope, weakness, light-headedness, numbness and headaches.   Hematological: denies adenopathy, easy bruising, personal or family bleeding history.  Psychiatric/ Behavioral: denies suicidal ideation, mood changes, confusion, nervousness, sleep disturbance and agitation.    Physical Exam: BP 116/78 mmHg  Pulse 75  Temp(Src) 97.8 F (36.6 C) (Oral)  Resp 20  Ht 5\' 9"  (1.753 m)  Wt 117.119 kg (258 lb 3.2 oz)  BMI 38.11 kg/m2  SpO2 92%  Wt Readings from Last 3 Encounters:  03/01/15 117.119 kg (258 lb 3.2 oz)  02/25/15 117.482 kg (259 lb)  02/14/15 116.574 kg (257 lb)    General: Vital signs reviewed and noted. Well-developed, well-nourished, in no acute distress; alert,   Head: Normocephalic, atraumatic, sclera anicteric,   Neck: Supple. Negative for carotid bruits. No JVD , he has a right CEA scar  Lungs:  bilateral  wheezes , L >>R   Heart: RRR with S1 S2.  2/6 systolic murmur   Abdomen/ GI :  Soft, non-tender, non-distended with normoactive bowel sounds. No hepatomegaly. No rebound/guarding. No obvious abdominal masses . Obese   MSK: Strength and the appear normal for age.   Extremities: No clubbing or cyanosis. No edema.  Distal pedal pulses are 2+ and equal   Neurologic:  CN are grossly intact,  No obvious motor or sensory defect.  Alert and oriented X 3. Moves all extremities spontaneously.  Psych: Responds to questions appropriately with a normal affect.     Lab results: Basic Metabolic Panel:  Recent Labs Lab 02/28/15 1207 03/01/15 0351  NA 136 137  K 4.2 4.3  CL 100* 100*  CO2  --  29  GLUCOSE 172* 190*  BUN 17 14  CREATININE 1.80* 1.84*  CALCIUM  --  9.0    Liver Function Tests: No results for input(s): AST, ALT, ALKPHOS, BILITOT, PROT, ALBUMIN in the last 168 hours. No results for input(s): LIPASE, AMYLASE in the last 168 hours. No results for input(s): AMMONIA in the last 168 hours.  CBC:  Recent Labs Lab 02/28/15 1135  02/28/15 1207 03/01/15 0351  WBC 8.8  --  6.8  NEUTROABS 6.9  --   --   HGB 12.6* 13.3 12.3*  HCT 38.6* 39.0 37.8*  MCV 86.5  --  86.1  PLT 176  --  157    Cardiac Enzymes:  Recent Labs Lab 02/28/15 1653 03/01/15 0022 03/01/15 0351  TROPONINI <0.03 <0.03 <0.03    BNP: Invalid input(s): POCBNP  CBG:  Recent Labs Lab 02/28/15 1610 02/28/15 2206 03/01/15 0737  GLUCAP 175* 213* 194*    Coagulation Studies: No results for input(s): LABPROT, INR in the last 72 hours.   Other results: Personal review of EKG shows :  - Normal sinus rhythm at 65. He has no ST or T wave changes.  Imaging: Dg Chest 2 View  02/28/2015   CLINICAL DATA:  Syncope, chest pain for 2 hours  EXAM: CHEST  2 VIEW  COMPARISON:  Chest x-ray of 01/09/2015  FINDINGS: The lungs remain hyperaerated with flattened hemidiaphragms consistent with a degree of emphysema. Somewhat prominent interstitial markings appear chronic and stable. Mediastinal and hilar contours are unremarkable. Mild cardiomegaly is stable. No bony abnormality is seen.  IMPRESSION: No active lung disease. Probable emphysema and mild chronic interstitial change.   Electronically Signed   By: Ivar Drape M.D.   On: 02/28/2015 11:54        Assessment & Plan:  1. Cough syncope: The patient presents with an episode of syncope immediately following a prolonged and severe coughing episode. This clearly is cough syncope.   I do not think that he needs any additional cardiac workup but he certainly needs to stop smoking and improve his only status as much as possible. His troponin levels are negative. There is no indication to do anything further to workup his ischemic heart disease. He sees Dr. Domenic Polite in approximately one month.  I think that he should be able to be discharged later today if his remaining workup is negative.  We'll sign off. Call us for questions.     Thayer Headings, Brooke Bonito., MD, [facility] 03/01/2015, 8:32 AM Office -  [phone] Pager [place]

## 2015-03-02 NOTE — Patient Instructions (Signed)
I was not involved with the patient except as described by nurse Ronnald Ramp. He reluctantly agreed to be transported to ER for evaluation after his mother asked him to agree.

## 2015-03-06 ENCOUNTER — Ambulatory Visit (INDEPENDENT_AMBULATORY_CARE_PROVIDER_SITE_OTHER)
Admission: RE | Admit: 2015-03-06 | Discharge: 2015-03-06 | Disposition: A | Discharge status: Home / Self Care | Payer: Medicaid Other | Source: Ambulatory Visit | Attending: Internal Medicine | Admitting: Internal Medicine

## 2015-03-06 DIAGNOSIS — J849 Interstitial pulmonary disease, unspecified: Secondary | ICD-10-CM

## 2015-03-14 ENCOUNTER — Telehealth: Payer: Self-pay | Admitting: Internal Medicine

## 2015-03-14 NOTE — Telephone Encounter (Signed)
Give fu to discuss CT results - first avail fine -s ee recent phone note for timuing   IMPRESSION: 1. Mild diffuse bronchial wall thickening with mild centrilobular and paraseptal emphysema. 2. Very mild diffuse peribronchovascular ground-glass attenuation noted in the upper lobes of the lungs bilaterally. 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 3. Atherosclerosis, including left main and 3 vessel coronary artery disease. Please note that although the presence of coronary artery calcium documents the presence of coronary artery disease, the severity of this disease and any potential stenosis cannot be assessed on this non-gated CT examination. Assessment for potential risk factor modification, dietary therapy or pharmacologic therapy may be warranted, if clinically indicated. 4. Lipomatous hypertrophy of the interatrial septum. This is typically of no clinical significance, although a small subset of these patients can present with atrial arrhythmias. 5. Hepatic steatosis.   Electronically Signed By: Vinnie Langton M.D. On: 03/06/2015 16:54

## 2015-03-15 NOTE — Telephone Encounter (Signed)
lmtcb for pt. Need to schedule appt with MR to discuss results - first available.

## 2015-03-15 NOTE — Telephone Encounter (Signed)
Patient is calling for results, [phone] or [phone].

## 2015-03-18 ENCOUNTER — Encounter: Payer: Self-pay | Admitting: Vascular Surgery

## 2015-03-19 ENCOUNTER — Encounter: Payer: Medicaid Other | Admitting: Vascular Surgery

## 2015-03-19 NOTE — Telephone Encounter (Signed)
lmtcb for pt on both numbers.  

## 2015-03-20 NOTE — Telephone Encounter (Signed)
Patient returned call, if cannot reach him on home number, please call [phone].

## 2015-03-20 NOTE — Telephone Encounter (Signed)
Spoke with the pt and scheduled appt with MR for 04/10/15

## 2015-04-10 ENCOUNTER — Encounter: Payer: Self-pay | Admitting: Internal Medicine

## 2015-04-10 ENCOUNTER — Ambulatory Visit (INDEPENDENT_AMBULATORY_CARE_PROVIDER_SITE_OTHER): Payer: Medicaid Other | Admitting: Internal Medicine

## 2015-04-10 ENCOUNTER — Ambulatory Visit: Payer: Medicaid Other | Admitting: Cardiology

## 2015-04-10 VITALS — BP 118/66 | HR 83 | Ht 72.0 in | Wt 263.0 lb

## 2015-04-10 DIAGNOSIS — R0600 Dyspnea, unspecified: Secondary | ICD-10-CM | POA: Insufficient documentation

## 2015-04-10 DIAGNOSIS — R054 Cough syncope: Secondary | ICD-10-CM | POA: Insufficient documentation

## 2015-04-10 DIAGNOSIS — J849 Interstitial pulmonary disease, unspecified: Secondary | ICD-10-CM | POA: Diagnosis not present

## 2015-04-10 DIAGNOSIS — J84117 Desquamative interstitial pneumonia: Secondary | ICD-10-CM | POA: Diagnosis not present

## 2015-04-10 DIAGNOSIS — R55 Syncope and collapse: Secondary | ICD-10-CM | POA: Insufficient documentation

## 2015-04-10 DIAGNOSIS — R05 Cough: Secondary | ICD-10-CM | POA: Insufficient documentation

## 2015-04-10 NOTE — Patient Instructions (Addendum)
ICD-9-CM ICD-10-CM   1. 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 Interstitial lung disease 515 J84.9 Ambulatory Referral to Neuro Rehab  3. Cough syncope 786.2 R05 Ambulatory Referral to Neuro Rehab  4. Dyspnea 786.09 R06.00     - you do not need o2 at daytime with rest or small levels of exertion  - buy finger pulse oximeter and ensure o2 is more than 88% - work with PCP [name], Kevin So, [name] to control anxiety  - at this poitn you do not qualify for hospice program  - refer home PT -respect decision not to try steroids for symptoms because of side effect fear - refer Mr Garald Balding neuro rehab for cough  Followup 3 months or sooner if needed  - spiro at followp in office

## 2015-04-10 NOTE — Progress Notes (Signed)
Subjective:    Patient ID: [name], male    DOB: June 01, 1956, 59 y.o.   MRN: [id number]  HPI   OV 04/10/2015  Chief Complaint  Patient presents with  . Follow-up    Pt states his breathing has worsened since last OV. Pt was admitted to [facility] for CP and syncope. Pt c/o increase in DOE, prod cough with dark brown mucus, chest disfomfort when active.    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 At that point in time spirometry showed a significant decline in forced vital capacity from 4 L at baseline to 2.6 L. He was using 4 L of oxygen at rest and 6 L with exertion at home. However we could not convincingly document hypoxemia with exertion on room air in the office at that time. Therefore to this and I ordered pulmonary function tests and CT scan of the chest. He did have CT scan of the chest in August 2016. There is no report on progression compared to February 2016 but I suspect the findings are similar. While he underwent pulmonary function test he had cough syncope and was admitted to the hospital. Cardiology notes reviewed and the final diagnosis was cough syncope. He he now presents for follow-up. He continues to be miserable with class IV dyspnea. He uses 4-6 liters of oxygen because he subjectively feels the need to do that. He does not monitor his pulse ox at home. He says that he continues to have cough syncope at home and has fallen several times. He has now moved to live with his 60 year old mom. He is socially isolated otherwise. He feels like he needs an extra layer of help at home. There are no other issues  Pulse oximetry on room air at rest and walking desaturation test 185 feet 3 laps on room air: RA rest after 15 min off o2, pulse o98% and lowest pulse ox with forehead probe was 90%. He did get dizzy with  coughing   Review of Systems  Constitutional: Negative for fever and unexpected weight change.  HENT: Negative for congestion, dental problem, ear pain, nosebleeds, postnasal drip, rhinorrhea, sinus pressure, sneezing, sore throat and trouble swallowing.   Eyes: Negative for redness and itching.  Respiratory: Positive for cough, chest tightness and shortness of breath. Negative for wheezing.   Cardiovascular: Negative for palpitations and leg swelling.  Gastrointestinal: Negative for nausea and vomiting.  Genitourinary: Negative for dysuria.  Musculoskeletal: Negative for joint swelling.  Skin: Negative for rash.  Neurological: Positive for dizziness. Negative for headaches.  Hematological: Does not bruise/bleed easily.  Psychiatric/Behavioral: Negative for dysphoric mood. The patient is not nervous/anxious.        Objective:   Physical Exam  Constitutional: He is oriented to person, place, and time. He appears well-developed and well-nourished. No distress.  Body mass index is 35.66 kg/(m^2).   HENT:  Head: Normocephalic and atraumatic.  Right Ear: External ear normal.  Left Ear: External ear normal.  Mouth/Throat: Oropharynx is clear and moist. No oropharyngeal exudate.  Eyes: Conjunctivae and EOM are normal. Pupils are equal, round, and reactive to light. Right eye exhibits no discharge. Left eye exhibits no discharge. No scleral icterus.  Neck: Normal range of motion. Neck supple. No JVD present. No tracheal deviation present. No thyromegaly present.  Cardiovascular: Normal rate, regular rhythm and intact distal pulses.  Exam reveals  no gallop and no friction rub.   No murmur heard. Pulmonary/Chest: Effort normal. No respiratory distress. He has no wheezes. He has rales. He exhibits no tenderness.  dyspneic  Abdominal: Soft. Bowel sounds are normal. He exhibits no distension and no mass. There is no tenderness. There is no rebound and no guarding.  Musculoskeletal: Normal  range of motion. He exhibits no edema or tenderness.  Has cane Deconditioned  Lymphadenopathy:    He has no cervical adenopathy.  Neurological: He is alert and oriented to person, place, and time. He has normal reflexes. No cranial nerve deficit. Coordination normal.  Skin: Skin is warm and dry. No rash noted. He is not diaphoretic. No erythema. No pallor.  Psychiatric:  anxiopus  Nursing note and vitals reviewed.   Filed Vitals:   04/10/15 1342  BP: 118/66  Pulse: 83  Height: 6' (1.829 m)  Weight: 263 lb (119.296 kg)  SpO2: 96%         Assessment & Plan:     ICD-9-CM ICD-10-CM   1. 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 Interstitial lung disease 515 J84.9 Ambulatory Referral to Neuro Rehab  3. Cough syncope 786.2 R05 Ambulatory Referral to Neuro Rehab  4. Dyspnea 786.09 R06.00     Difficult situation. He is having a higher level symptom burden with cough and dyspnea and even associated cough syncope. However this severe degree of interstitial lung disease itself is not severe to the extent that he desaturates. In fact he does not desaturate at room air at rest or even with walking. Nevertheless he has a subjective need to use a lot of oxygen. He is not monitoring his oxygen use at home. I reassured him that he needs to monitor his oxygen levels at home with a finger pulse ox and meter and as long as it is over 88% he should be fine. For his cough syncope and cough I offered prednisone but he fears weight gain and worsening anxiety so he does not want to do that either. Instead we will refer him to neuro rehabilitation for voice modulation therapy for cough. I think addressing his anxiety burden is going to be key and I will defer that to the primary care physician who will receive this note. We discussed hospice and he is interested but without desaturations I do not think he will qualify for home hospice because his life  expectancy will be over 6 months  PLAN - you do not need o2 at daytime with rest or small levels of exertion  - buy finger pulse oximeter and ensure o2 is more than 88% - work with PCP [name], [name], MD to control anxiety  - at this poitn you do not qualify for hospice program  - refer home PT -respect decision not to try steroids for symptoms because of side effect fear - refer Mr Garald Balding neuro rehab for cough  Followup 3 months or sooner if needed  - spiro at followp in office   > 50% of this > 25 min visit spent in face to face counseling or coordination of care    Dr. Brand Males, M.D., [facility].C.P Pulmonary and Critical Care Medicine Staff Physician [facility] Pager: [phone], If no answer or between  15:00h - 7:00h: call [phone]  04/10/2015 2:28 PM

## 2015-04-11 ENCOUNTER — Telehealth: Payer: Self-pay | Admitting: Cardiovascular Disease

## 2015-04-11 NOTE — Telephone Encounter (Signed)
Per Out patient Vascular Report patient is due for f/u, but results does not support this. What needs to be done?

## 2015-04-11 NOTE — Addendum Note (Signed)
Addended by: Maurice March on: 04/11/2015 01:41 PM   Modules accepted: Orders

## 2015-04-12 ENCOUNTER — Encounter: Payer: Self-pay | Admitting: Cardiology

## 2015-04-12 ENCOUNTER — Ambulatory Visit (INDEPENDENT_AMBULATORY_CARE_PROVIDER_SITE_OTHER): Payer: Medicaid Other | Admitting: Cardiology

## 2015-04-12 ENCOUNTER — Telehealth: Payer: Self-pay | Admitting: Internal Medicine

## 2015-04-12 VITALS — BP 110/84 | HR 84 | Ht 72.0 in | Wt 262.0 lb

## 2015-04-12 DIAGNOSIS — I6523 Occlusion and stenosis of bilateral carotid arteries: Secondary | ICD-10-CM

## 2015-04-12 DIAGNOSIS — E782 Mixed hyperlipidemia: Secondary | ICD-10-CM | POA: Diagnosis not present

## 2015-04-12 DIAGNOSIS — I2510 Atherosclerotic heart disease of native coronary artery without angina pectoris: Secondary | ICD-10-CM

## 2015-04-12 NOTE — Patient Instructions (Signed)
Your physician recommends that you continue on your current medications as directed. Please refer to the Current Medication list given to you today. Your physician recommends that you schedule a follow-up appointment in: 4 months. You will receive a reminder letter in the mail in about 2 months reminding you to call and schedule your appointment. If you don't receive this letter, please contact our office. 

## 2015-04-12 NOTE — Telephone Encounter (Signed)
Called spoke with Sharon from [facility]. She reports pt does not qualify for home health according to medicaid guidelines. FYI for Dr. Chase Caller

## 2015-04-12 NOTE — Progress Notes (Signed)
Cardiology Office Note  Date: 04/12/2015   ID: Kevin, [name] 1955/08/11, MRN [id number]  PCP: [name], BETTY G, MD  Primary Cardiologist: [name], MD   Chief Complaint  Patient presents with  . Coronary Artery Disease    History of Present Illness: [name] is a medically complex 59 y.o. male last seen in May. Record review finds hospitalization in August with atypical chest pains associated with violent coughing and loss of consciousness (while trying to do PFTs). Cardiac markers were normal, symptoms did not recur under observation, and no additional cardiac testing was undertaken. He was felt that he most likely had post-tussive syncope. He has had progressive decline in his pulmonary status, has interstitial lung disease and DIP, continued tobacco abuse, followed by Dr. Chase Caller. I reviewed the recent notes.  He presents today for a regular cardiology visit. Fortunately, no angina symptoms reported. we reviewed his medications. He continues to report mild, chronic lower leg and ankle edema, uses Lasix. He has normal LVEF, and cardiac catheterization from last year showed stable revascularization.   Past Medical History  Diagnosis Date  . Essential hypertension, benign   . History of stroke     Right MCA distribution, residual left-sided weakness  . Noncompliance   . COPD (chronic obstructive pulmonary disease)   . Hyperlipidemia   . History of pneumonia   . Type 2 diabetes mellitus 2011  . GERD (gastroesophageal reflux disease)   . Cocaine abuse   . Cholecystitis, acute 12/20/2013    Lap chole 6/5  . Coronary atherosclerosis of native coronary artery     a. 03/09/2013 Cath/PCI: LM nl, LAD: 50p, 21m (2.5x16 promus DES), LCX nl, OM1 min irregs, LPL/LPDA diff dzs, RCA nondom, mod diff dzs, EF 55%.  . Depression   . Anxiety   . History of nephrolithiasis   . Arthritis   . Hepatitis Late 1970s  . Sleep apnea     On CPAP, 4L O2 no cpap at home yet  .  Renal cell carcinoma     Status post radical right nephrectomy August 2015  . Headache     Current Outpatient Prescriptions  Medication Sig Dispense Refill  . albuterol (PROVENTIL HFA;VENTOLIN HFA) 108 (90 BASE) MCG/ACT inhaler Inhale 2 puffs into the lungs every 6 (six) hours as needed for wheezing or shortness of breath.    [name] albuterol (PROVENTIL) (2.5 MG/3ML) 0.083% nebulizer solution Take 3 mLs (2.5 mg total) by nebulization every 3 (three) hours as needed for wheezing or shortness of breath. 75 mL 12  . alfuzosin (UROXATRAL) 10 MG 24 hr tablet Take 10 mg by mouth daily with breakfast.    . ALPRAZolam (XANAX) 0.25 MG tablet Take 0.25 mg by mouth 2 (two) times daily as needed for anxiety.    [name] atorvastatin (LIPITOR) 20 MG tablet Take 20 mg by mouth daily.    . budesonide-formoterol (SYMBICORT) 160-4.5 MCG/ACT inhaler Inhale 2 puffs into the lungs 2 (two) times daily. 1 Inhaler 3  . cholecalciferol (VITAMIN D) 1000 UNITS tablet Take 2,000 Units by mouth daily.    . citalopram (CELEXA) 20 MG tablet Take 40 mg by mouth daily.     . clopidogrel (PLAVIX) 75 MG tablet Take 1 tablet (75 mg total) by mouth daily. 30 tablet 11  . diltiazem (CARDIZEM CD) 240 MG 24 hr capsule Take 240 mg by mouth daily.    . Fish Oil-Cholecalciferol (FISH OIL + D3 PO) Take 1 capsule by mouth 4 (four)  times daily.     [name] FLUoxetine (PROZAC) 10 MG capsule Take 10 mg by mouth every morning.    . furosemide (LASIX) 40 MG tablet Take 40 mg by mouth daily.    [name] gabapentin (NEURONTIN) 300 MG capsule Take 300 mg by mouth at bedtime.    [name] glipiZIDE (GLUCOTROL) 10 MG tablet Take 10 mg by mouth 2 (two) times daily.    [name] ipratropium-albuterol (DUONEB) 0.5-2.5 (3) MG/3ML SOLN Take 3 mLs by nebulization every 6 (six) hours as needed (Shortness of breath).    . isosorbide mononitrate (IMDUR) 60 MG 24 hr tablet Take 1 tablet (60 mg total) by mouth daily. 90 tablet 3  . nicotine (NICODERM CQ) 14 mg/24hr patch Place 1 patch (14 mg  total) onto the skin daily. 28 patch 0  . nitroGLYCERIN (NITROSTAT) 0.4 MG SL tablet Place 1 tablet (0.4 mg total) under the tongue every 5 (five) minutes as needed for chest pain. 25 tablet 3  . omeprazole (PRILOSEC) 40 MG capsule Take 40 mg by mouth daily.    . ranitidine (ZANTAC) 300 MG tablet Take 300 mg by mouth every morning.     . solifenacin (VESICARE) 5 MG tablet Take 5 mg by mouth daily.    . valsartan (DIOVAN) 40 MG tablet Take 20 mg by mouth daily.      No current facility-administered medications for this visit.    Allergies:  Oxycodone   Social History: The patient  reports that he has been smoking Cigarettes.  He started smoking about 42 years ago. He has a 20.5 pack-year smoking history. He has never used smokeless tobacco. He reports that he does not drink alcohol or use illicit drugs.   ROS:  Please see the history of present illness. Otherwise, complete review of systems is positive for chronic shortness of breath, leg discomfort.  All other systems are reviewed and negative.   Physical Exam: VS:  BP 110/84 mmHg  Pulse 84  Ht 6' (1.829 m)  Wt 262 lb (118.842 kg)  BMI 35.53 kg/m2  SpO2 93%, BMI Body mass index is 35.53 kg/(m^2).  Wt Readings from Last 3 Encounters:  04/12/15 262 lb (118.842 kg)  04/10/15 263 lb (119.296 kg)  03/01/15 258 lb 3.2 oz (117.119 kg)    Overweight..Appears comfortable at rest. Oxygen via nasal cannula. HEENT: Conjunctiva and lids normal, oropharynx clear.  Neck: Supple, no elevated JVP, bilateral carotid bruits, no thyromegaly.  Lungs: Diminished but clear to auscultation, nonlabored breathing at rest.  Cardiac: Regular rate and rhythm, no S3, 2/6 systolic murmur, no pericardial rub.  Abdomen: Soft, nontender, bowel sounds present, no guarding or rebound.  Extremities: Bilateral ankle edema, distal pulses 2+.    ECG: Recent tracing from a/05/2015 showed sinus rhythm with prolonged PR interval and left axis.   Recent  Labwork: 05/23/2014: Pro B Natriuretic peptide (BNP) 221.3* 10/13/2014: ALT 19; AST 21; Magnesium 1.9 03/01/2015: BUN 14; Creatinine, Ser 1.84*; Hemoglobin 12.3*; Platelets 157; Potassium 4.3; Sodium 137     Component Value Date/Time   CHOL 128 12/19/2013 0220   TRIG 152* 12/19/2013 0220   HDL 31* 12/19/2013 0220   CHOLHDL 4.1 12/19/2013 0220   VLDL 30 12/19/2013 0220   LDLCALC 67 12/19/2013 0220    Other Studies Reviewed Today:  Cardiac catheterization 12/20/2013: Hemodynamic Findings: Central aortic pressure: 120/62 Left ventricular pressure: 127/7/15  Angiographic Findings:  Left main: Short segment. No obstructive disease.   Left Anterior Descending Artery: Moderate caliber vessel that courses to  the apex. There is 40% proximal stenosis. The mid stented segment is patent without restenosis. The distal vessel has diffuse non-obstructive plaque. The diagonal branches are small in caliber with no obstructive disease.   Circumflex Artery: Large dominant system with large bifurcating first obtuse marginal branch and three small caliber posterolateral branches. The OM branch has diffuse 50% proximal stenosis, no focally obstructive lesions. The three small caliber posterolateral branches all have diffuse moderate to severe stenosis that is unchanged from last cath.   Right Coronary Artery: Small non-dominant vessel with 40% mid vessel stenosis.   Left Ventricular Angiogram: LVEF=65%  Impression: 1. Double vessel CAD with patent stent mid LAD 2. Moderate non-obstructive disease in the Circumflex and RCA 3. Normal LV systolic function   Assessment and Plan:  1. CAD status post previous DES to the LAD, and otherwise medically managed moderate disease within the circumflex and RCA distributions. He is symptomatically stable on medical therapy. No changes were made today.  2. Chronic, progressive lung disease. Has interstitial lung disease and DIP, followed by Dr. Chase Caller.  3.  Hyperlipidemia, on Lipitor.  4. Status post right carotid endarterectomy in March of this year by Dr. Donnetta Hutching.  Current medicines were reviewed with the patient today.   Disposition: FU with me in 4 months.   Signed, Satira Sark, MD, [facility] 04/12/2015 11:44 AM    [facility] [place], [place], [place] [address] Phone: [phone]; Fax: [phone]

## 2015-04-12 NOTE — Telephone Encounter (Signed)
Ok thanks 

## 2015-04-25 ENCOUNTER — Ambulatory Visit: Payer: Medicaid Other | Admitting: Neurology

## 2015-04-29 ENCOUNTER — Encounter: Payer: Self-pay | Admitting: Vascular Surgery

## 2015-04-30 ENCOUNTER — Encounter: Payer: Medicaid Other | Admitting: Vascular Surgery

## 2015-04-30 ENCOUNTER — Encounter (HOSPITAL_COMMUNITY): Payer: Medicaid Other

## 2015-05-09 ENCOUNTER — Telehealth: Payer: Self-pay | Admitting: Internal Medicine

## 2015-05-09 NOTE — Telephone Encounter (Signed)
Per MR, is there a spot I can use for this pt on TP schedule [place], Please advise.

## 2015-05-09 NOTE — Telephone Encounter (Signed)
Called and spoke to pt. Pt c/o increase in SOB, cough with little mucus production - white in color, midsternal chest pain that radiates to right arm when SOB, and the pt has noticed swelling in his abdomen and BIL lower extremities. Pt states his clothes are tighter and is not able to get his shoes on. Pt states he thinks he has gained 30-35lb in a month. Pt denies f/c/s. Pt states he is taking Lasix 40mg  daily.  Dr. Chase Caller please advise. Thanks.

## 2015-05-09 NOTE — Telephone Encounter (Signed)
If he feels emergent - > go to ER Otherwise, check with TP 05/10/15  Till the, increase lasix to 40mg  bid

## 2015-05-09 NOTE — Telephone Encounter (Signed)
Called and spoke with pt. Scheduled an ov with TP on 05/10/15 @ 3pm. Explained to pt that if he feels it is emergent to go to the ER and that he needs to increase lasix to 40mg  BID per MR. Pt voiced understanding and had no further questions. Nothing further needed.

## 2015-05-10 ENCOUNTER — Encounter: Payer: Self-pay | Admitting: Adult Health

## 2015-05-10 ENCOUNTER — Ambulatory Visit (INDEPENDENT_AMBULATORY_CARE_PROVIDER_SITE_OTHER): Payer: Medicaid Other | Admitting: Adult Health

## 2015-05-10 ENCOUNTER — Ambulatory Visit (INDEPENDENT_AMBULATORY_CARE_PROVIDER_SITE_OTHER)
Admission: RE | Admit: 2015-05-10 | Discharge: 2015-05-10 | Disposition: A | Discharge status: Home / Self Care | Payer: Medicaid Other | Source: Ambulatory Visit | Attending: Adult Health | Admitting: Adult Health

## 2015-05-10 ENCOUNTER — Other Ambulatory Visit (INDEPENDENT_AMBULATORY_CARE_PROVIDER_SITE_OTHER): Payer: Medicaid Other

## 2015-05-10 ENCOUNTER — Telehealth: Payer: Self-pay | Admitting: Adult Health

## 2015-05-10 VITALS — BP 124/78 | HR 80 | Temp 98.6°F | Ht 72.0 in | Wt 266.0 lb

## 2015-05-10 DIAGNOSIS — J9611 Chronic respiratory failure with hypoxia: Secondary | ICD-10-CM | POA: Diagnosis not present

## 2015-05-10 DIAGNOSIS — J441 Chronic obstructive pulmonary disease with (acute) exacerbation: Secondary | ICD-10-CM | POA: Diagnosis not present

## 2015-05-10 DIAGNOSIS — J849 Interstitial pulmonary disease, unspecified: Secondary | ICD-10-CM

## 2015-05-10 LAB — BASIC METABOLIC PANEL
BUN: 19 mg/dL (ref 6–23)
CO2: 28 meq/L (ref 19–32)
Calcium: 9.6 mg/dL (ref 8.4–10.5)
Chloride: 99 mEq/L (ref 96–112)
Creatinine, Ser: 1.69 mg/dL — ABNORMAL HIGH (ref 0.40–1.50)
GFR: 44.24 mL/min — ABNORMAL LOW (ref >60.00)
GLUCOSE: 201 mg/dL — AB (ref 70–99)
POTASSIUM: 4.4 meq/L (ref 3.5–5.1)
SODIUM: 135 meq/L (ref 135–145)

## 2015-05-10 LAB — BRAIN NATRIURETIC PEPTIDE: Pro B Natriuretic peptide (BNP): 58 pg/mL (ref 0.0–100.0)

## 2015-05-10 NOTE — Progress Notes (Signed)
Quick Note:  Attempted to call pt. Unable to LVM. VM was full ______

## 2015-05-10 NOTE — Assessment & Plan Note (Signed)
Mild flare vs volume overload.  Check bnp and cxr  Hold on steroids for now with increased fluid retention  Plan  Take Extra Lasix 40mg  daily for 3 days  Low salt  Leg elevated.  Chest xray and labs.  Try to quit smoking  Continue on Symbicort Twice daily  .  follow up Dr. Chase Caller 2 month as planned and As needed   Please contact office for sooner follow up if symptoms do not improve or worsen or seek emergency care

## 2015-05-10 NOTE — Telephone Encounter (Signed)
Called and spoke to pt. Informed him of the results and recs per TP. Pt verbalized understanding and denied any further questions or concerns at this time.    Notes Recorded by Melvenia Needles, NP on 05/10/2015 at 4:31 PM cxr shows increased edema  Cont w/ ov recs for extra lasix x 3 days  Please contact office for sooner follow up if symptoms do not improve or worsen or seek emergency care

## 2015-05-10 NOTE — Progress Notes (Signed)
   Subjective:    Patient ID: [name], male    DOB: 12-30-1955, 59 y.o.   MRN: [id number]  HPI 59 yo male smoker with  ILD(DIP)  on Oxygen .  Polysubstance abuse hx.   05/10/2015 Acute OV  Pt presents for an acute office visit.  Complains of 2-3 weeks of worsening dyspnea/DOE. More swelling in ankles.  Has dry cough , no fever or discolored mucus .  Weight tr up ~10 lbs over last month.  Weight is up 25lbs over last 6 months .  Had venous doppler x 2 was neg for DVT. Recent admission for cough syncope while trying to do PFT with atypical chest pain.   Echo in 2015 with EF 65%, Gr 1 DD   Has cut back on smoking. Discussed smoking cessation. Remains on 5-6 L of O2.        Review of Systems Constitutional:   No  weight loss, night sweats,  Fevers, chills,  +fatigue, or  lassitude.  HEENT:   No headaches,  Difficulty swallowing,  Tooth/dental problems, or  Sore throat,                No sneezing, itching, ear ache, nasal congestion, post nasal drip,   CV:  No chest pain,  Orthopnea, PND,   anasarca, dizziness, palpitations, syncope.   GI  No heartburn, indigestion, abdominal pain, nausea, vomiting, diarrhea, change in bowel habits, loss of appetite, bloody stools.   Resp:    No chest wall deformity  Skin: no rash or lesions.  GU: no dysuria, change in color of urine, no urgency or frequency.  No flank pain, no hematuria   MS:  No joint pain or swelling.  No decreased range of motion.  No back pain.  Psych:  No change in mood or affect. No depression or anxiety.  No memory loss.         Objective:   Physical Exam GEN: A/Ox3; pleasant , NAD, obese   HEENT:  [place]/AT,  EACs-clear, TMs-wnl, NOSE-clear, THROAT-clear, no lesions, no postnasal drip or exudate noted.   NECK:  Supple w/ fair ROM; no JVD; normal carotid impulses w/o bruits; no thyromegaly or nodules palpated; no lymphadenopathy.  RESP  BB crackles .no accessory muscle use, no dullness to  percussion  CARD:  RRR, no m/r/g  , 1+  peripheral edema, pulses intact, no cyanosis or clubbing.  GI:   Soft & nt; nml bowel sounds; no organomegaly or masses detected.  Musco: Warm bil, no deformities or joint swelling noted.   Neuro: alert, no focal deficits noted.    Skin: Warm, no lesions or rashes       Assessment & Plan:

## 2015-05-10 NOTE — Patient Instructions (Addendum)
Take Extra Lasix 40mg  daily for 3 days  Low salt  Leg elevated.  Chest xray and labs.  Try to quit smoking  Continue on Symbicort Twice daily  .  follow up Dr. Chase Caller 2 month as planned and As needed   Please contact office for sooner follow up if symptoms do not improve or worsen or seek emergency care

## 2015-05-10 NOTE — Addendum Note (Signed)
Addended by: Osa Craver on: 05/10/2015 03:33 PM   Modules accepted: Orders

## 2015-05-10 NOTE — Assessment & Plan Note (Signed)
Appears compensated on O2.

## 2015-05-10 NOTE — Assessment & Plan Note (Signed)
?  flare vs volume overload Hold on abx and steroids for now  Check cxr and labs w/ bnp   Plan  Take Extra Lasix 40mg  daily for 3 days  Low salt  Leg elevated.  Chest xray and labs.  Try to quit smoking  Continue on Symbicort Twice daily  .  follow up Dr. Chase Caller 2 month as planned and As needed   Please contact office for sooner follow up if symptoms do not improve or worsen or seek emergency care

## 2015-05-13 NOTE — Progress Notes (Signed)
Quick Note:  Called and spoke with pt. Reviewed results and recs. Pt voiced understanding and had no further questions. ______ 

## 2015-05-13 NOTE — Progress Notes (Signed)
Quick Note:  Called and spoke with pt. Reviewed results and recs. Pt voiced understanding and had no further questions. Results faxed to PCP and pt aware. ______

## 2015-05-22 ENCOUNTER — Ambulatory Visit: Payer: Medicaid Other

## 2015-05-30 ENCOUNTER — Ambulatory Visit: Payer: Medicaid Other | Attending: Internal Medicine

## 2015-06-05 ENCOUNTER — Encounter: Payer: Self-pay | Admitting: Emergency Medicine

## 2015-06-05 ENCOUNTER — Ambulatory Visit (INDEPENDENT_AMBULATORY_CARE_PROVIDER_SITE_OTHER)
Admission: RE | Admit: 2015-06-05 | Discharge: 2015-06-05 | Disposition: A | Discharge status: Home / Self Care | Payer: Medicaid Other | Source: Ambulatory Visit | Attending: Emergency Medicine | Admitting: Emergency Medicine

## 2015-06-05 ENCOUNTER — Telehealth: Payer: Self-pay | Admitting: Internal Medicine

## 2015-06-05 ENCOUNTER — Ambulatory Visit (INDEPENDENT_AMBULATORY_CARE_PROVIDER_SITE_OTHER): Payer: Medicaid Other | Admitting: Emergency Medicine

## 2015-06-05 VITALS — BP 120/72 | HR 89

## 2015-06-05 DIAGNOSIS — J84117 Desquamative interstitial pneumonia: Secondary | ICD-10-CM

## 2015-06-05 MED ORDER — PREDNISONE 10 MG PO TABS: ORAL_TABLET | ORAL | Status: DC

## 2015-06-05 NOTE — Assessment & Plan Note (Signed)
No current wheezing. I do not believe he has exacerbation of obstructive lung disease. I will continue him on his Symbicort, discussed increasing his duo nebs from twice a day to 4 times a day.

## 2015-06-05 NOTE — Assessment & Plan Note (Signed)
Chronic multifactorial hypoxemic respiratory failure. Influences include obesity hypoventilation, untreated sleep apnea, interstitial lung disease/DIP, COPD, presumed secondary pulmonary hypertension. Currently treated with 5-6 L/m.

## 2015-06-05 NOTE — Assessment & Plan Note (Signed)
untreated

## 2015-06-05 NOTE — Telephone Encounter (Signed)
Spoke with patient wife, states that the patient is having increased SOB and having a very difficult time breathing. Reports O2 sats are beginning to drop and has been ranging around 90%. Pt is unable to get a deep breath - wife states that his breaths are very shallow. Denies wheezing, cough, congestion and chest tx. Pt scheduled to see Dr Lamonte Sakai today at 345. Nothing further needed.

## 2015-06-05 NOTE — Assessment & Plan Note (Signed)
Discussed with him the fact that his pneumonitis is not going to improve or resolve as long as he continues to smoke. He understands the need to stop. We discussed treatment options, he would like to start prednisone and then follow-up with Dr Chase Caller to review any improvement. I suspect that any improvement will be transient given his continued tobacco use.  Will treat with 40 mg for 5 days then 30 mg for 5 days then 20 mg daily until he returns for evaluation

## 2015-06-05 NOTE — Patient Instructions (Signed)
You must stop smoking completely. Your lung inflammation will never resolve if you do not stop altogether.  Continue your Symbicort as you are taking it Continue oxygen at 5-6L/min.  Use albuterol as needed You may increase your Duoneb to as high as 4x a day if needed for shortness of breath CXR today  Take prednisone as directed until you are able to follow with Dr Chase Caller on 07/05/15.

## 2015-06-05 NOTE — Addendum Note (Signed)
Addended by: Desmond Dike C on: 06/05/2015 04:27 PM   Modules accepted: Orders

## 2015-06-05 NOTE — Progress Notes (Signed)
   Subjective:    Patient ID: [name], male    DOB: 05/08/1956, 59 y.o.   MRN: [id number]  HPI 59 year old man with tobacco abuse and history of interstitial lung disease, desquamative interstitial pneumonitis and burnt out Langerhans' cell histiocytosis, hypoxemic respiratory failure; untreated OSA unable to tolerate CPAP. He probably also has some degree of superimposed COPD.  He presents today with two weeks of increased dyspnea, . He is having paroxysms of cough, can lead to pre-syncope / syncope. Last was 2 days ago. He has LE edema, probable more than his baseline. He is using duoneb bid, symbicort bid, albuterol prn - averages once a day. He complains of dry mouth. He took extra lasix after last OV here with TP, helped his edema but not his dyspnea.    Review of Systems As per HPI.      Objective:   Physical Exam Filed Vitals:   06/05/15 1557  BP: 120/72  Pulse: 89  SpO2: 90%   Gen: Pleasant, obese man in wheelchair,  in no distress,  normal affect  ENT: No lesions,  mouth clear,  oropharynx clear, no postnasal drip  Neck: No JVD, no TMG, no carotid bruits  Lungs: No use of accessory muscles, clear without rales or rhonchi  Cardiovascular: RRR, heart sounds normal, no murmur or gallops, no peripheral edema  Musculoskeletal: No deformities, no cyanosis or clubbing  Neuro: alert, non focal  Skin: Warm, no lesions or rashes     Assessment & Plan:  Chronic respiratory failure with hypoxia Chronic multifactorial hypoxemic respiratory failure. Influences include obesity hypoventilation, untreated sleep apnea, interstitial lung disease/DIP, COPD, presumed secondary pulmonary hypertension. Currently treated with 5-6 L/m.   COLD (chronic obstructive lung disease) No current wheezing. I do not believe he has exacerbation of obstructive lung disease. I will continue him on his Symbicort, discussed increasing his duo nebs from twice a day to 4 times a day.   Obstructive  sleep apnea untreated  Desquamative interstitial pneumonitis Discussed with him the fact that his pneumonitis is not going to improve or resolve as long as he continues to smoke. He understands the need to stop. We discussed treatment options, he would like to start prednisone and then follow-up with Dr Chase Caller to review any improvement. I suspect that any improvement will be transient given his continued tobacco use.  Will treat with 40 mg for 5 days then 30 mg for 5 days then 20 mg daily until he returns for evaluation

## 2015-07-05 ENCOUNTER — Inpatient Hospital Stay (HOSPITAL_COMMUNITY): Payer: Medicaid Other

## 2015-07-05 ENCOUNTER — Encounter: Payer: Self-pay | Admitting: Internal Medicine

## 2015-07-05 ENCOUNTER — Encounter (HOSPITAL_COMMUNITY): Payer: Self-pay

## 2015-07-05 ENCOUNTER — Ambulatory Visit (INDEPENDENT_AMBULATORY_CARE_PROVIDER_SITE_OTHER): Payer: Medicaid Other | Admitting: Internal Medicine

## 2015-07-05 ENCOUNTER — Inpatient Hospital Stay (HOSPITAL_COMMUNITY)
Admission: AD | Admit: 2015-07-05 | Discharge: 2015-07-09 | DRG: 291 | Disposition: A | Discharge status: Home Health | Payer: Medicaid Other | Source: Ambulatory Visit | Attending: Internal Medicine | Admitting: Internal Medicine

## 2015-07-05 VITALS — BP 128/80 | HR 89 | Ht 72.0 in | Wt 283.0 lb

## 2015-07-05 DIAGNOSIS — Z6838 Body mass index (BMI) 38.0-38.9, adult: Secondary | ICD-10-CM | POA: Diagnosis not present

## 2015-07-05 DIAGNOSIS — R0600 Dyspnea, unspecified: Secondary | ICD-10-CM

## 2015-07-05 DIAGNOSIS — F329 Major depressive disorder, single episode, unspecified: Secondary | ICD-10-CM

## 2015-07-05 DIAGNOSIS — E118 Type 2 diabetes mellitus with unspecified complications: Secondary | ICD-10-CM

## 2015-07-05 DIAGNOSIS — R054 Cough syncope: Secondary | ICD-10-CM

## 2015-07-05 DIAGNOSIS — R292 Abnormal reflex: Secondary | ICD-10-CM

## 2015-07-05 DIAGNOSIS — Z7902 Long term (current) use of antithrombotics/antiplatelets: Secondary | ICD-10-CM | POA: Diagnosis not present

## 2015-07-05 DIAGNOSIS — R55 Syncope and collapse: Secondary | ICD-10-CM

## 2015-07-05 DIAGNOSIS — J449 Chronic obstructive pulmonary disease, unspecified: Secondary | ICD-10-CM | POA: Diagnosis present

## 2015-07-05 DIAGNOSIS — D763 Other histiocytosis syndromes: Secondary | ICD-10-CM | POA: Diagnosis present

## 2015-07-05 DIAGNOSIS — R0789 Other chest pain: Secondary | ICD-10-CM

## 2015-07-05 DIAGNOSIS — Z85528 Personal history of other malignant neoplasm of kidney: Secondary | ICD-10-CM

## 2015-07-05 DIAGNOSIS — E872 Acidosis: Secondary | ICD-10-CM | POA: Diagnosis present

## 2015-07-05 DIAGNOSIS — I5033 Acute on chronic diastolic (congestive) heart failure: Secondary | ICD-10-CM | POA: Diagnosis present

## 2015-07-05 DIAGNOSIS — F17210 Nicotine dependence, cigarettes, uncomplicated: Secondary | ICD-10-CM | POA: Diagnosis present

## 2015-07-05 DIAGNOSIS — I9589 Other hypotension: Secondary | ICD-10-CM

## 2015-07-05 DIAGNOSIS — J9620 Acute and chronic respiratory failure, unspecified whether with hypoxia or hypercapnia: Secondary | ICD-10-CM | POA: Diagnosis not present

## 2015-07-05 DIAGNOSIS — J9621 Acute and chronic respiratory failure with hypoxia: Secondary | ICD-10-CM | POA: Diagnosis present

## 2015-07-05 DIAGNOSIS — F1910 Other psychoactive substance abuse, uncomplicated: Secondary | ICD-10-CM

## 2015-07-05 DIAGNOSIS — N189 Chronic kidney disease, unspecified: Secondary | ICD-10-CM

## 2015-07-05 DIAGNOSIS — J441 Chronic obstructive pulmonary disease with (acute) exacerbation: Secondary | ICD-10-CM | POA: Diagnosis present

## 2015-07-05 DIAGNOSIS — J849 Interstitial pulmonary disease, unspecified: Secondary | ICD-10-CM

## 2015-07-05 DIAGNOSIS — I10 Essential (primary) hypertension: Secondary | ICD-10-CM

## 2015-07-05 DIAGNOSIS — M1990 Unspecified osteoarthritis, unspecified site: Secondary | ICD-10-CM | POA: Diagnosis present

## 2015-07-05 DIAGNOSIS — M79604 Pain in right leg: Secondary | ICD-10-CM

## 2015-07-05 DIAGNOSIS — I779 Disorder of arteries and arterioles, unspecified: Secondary | ICD-10-CM

## 2015-07-05 DIAGNOSIS — F32A Depression, unspecified: Secondary | ICD-10-CM

## 2015-07-05 DIAGNOSIS — Z7951 Long term (current) use of inhaled steroids: Secondary | ICD-10-CM | POA: Diagnosis not present

## 2015-07-05 DIAGNOSIS — E1122 Type 2 diabetes mellitus with diabetic chronic kidney disease: Secondary | ICD-10-CM | POA: Diagnosis present

## 2015-07-05 DIAGNOSIS — E1165 Type 2 diabetes mellitus with hyperglycemia: Secondary | ICD-10-CM | POA: Diagnosis present

## 2015-07-05 DIAGNOSIS — J810 Acute pulmonary edema: Secondary | ICD-10-CM

## 2015-07-05 DIAGNOSIS — Z91148 Patient's other noncompliance with medication regimen for other reason: Secondary | ICD-10-CM

## 2015-07-05 DIAGNOSIS — I509 Heart failure, unspecified: Secondary | ICD-10-CM

## 2015-07-05 DIAGNOSIS — F419 Anxiety disorder, unspecified: Secondary | ICD-10-CM | POA: Diagnosis present

## 2015-07-05 DIAGNOSIS — R5383 Other fatigue: Secondary | ICD-10-CM

## 2015-07-05 DIAGNOSIS — E785 Hyperlipidemia, unspecified: Secondary | ICD-10-CM | POA: Diagnosis present

## 2015-07-05 DIAGNOSIS — G4700 Insomnia, unspecified: Secondary | ICD-10-CM | POA: Diagnosis present

## 2015-07-05 DIAGNOSIS — IMO0002 Reserved for concepts with insufficient information to code with codable children: Secondary | ICD-10-CM

## 2015-07-05 DIAGNOSIS — I2510 Atherosclerotic heart disease of native coronary artery without angina pectoris: Secondary | ICD-10-CM | POA: Diagnosis present

## 2015-07-05 DIAGNOSIS — E8770 Fluid overload, unspecified: Secondary | ICD-10-CM

## 2015-07-05 DIAGNOSIS — F40240 Claustrophobia: Secondary | ICD-10-CM | POA: Diagnosis present

## 2015-07-05 DIAGNOSIS — Z79899 Other long term (current) drug therapy: Secondary | ICD-10-CM | POA: Diagnosis not present

## 2015-07-05 DIAGNOSIS — G9341 Metabolic encephalopathy: Secondary | ICD-10-CM

## 2015-07-05 DIAGNOSIS — G4733 Obstructive sleep apnea (adult) (pediatric): Secondary | ICD-10-CM

## 2015-07-05 DIAGNOSIS — I739 Peripheral vascular disease, unspecified: Secondary | ICD-10-CM

## 2015-07-05 DIAGNOSIS — Z8673 Personal history of transient ischemic attack (TIA), and cerebral infarction without residual deficits: Secondary | ICD-10-CM

## 2015-07-05 DIAGNOSIS — R200 Anesthesia of skin: Secondary | ICD-10-CM

## 2015-07-05 DIAGNOSIS — I951 Orthostatic hypotension: Secondary | ICD-10-CM

## 2015-07-05 DIAGNOSIS — K219 Gastro-esophageal reflux disease without esophagitis: Secondary | ICD-10-CM | POA: Diagnosis present

## 2015-07-05 DIAGNOSIS — J84117 Desquamative interstitial pneumonia: Secondary | ICD-10-CM

## 2015-07-05 DIAGNOSIS — R0683 Snoring: Secondary | ICD-10-CM

## 2015-07-05 DIAGNOSIS — J9600 Acute respiratory failure, unspecified whether with hypoxia or hypercapnia: Secondary | ICD-10-CM | POA: Diagnosis not present

## 2015-07-05 DIAGNOSIS — R05 Cough: Secondary | ICD-10-CM

## 2015-07-05 DIAGNOSIS — Z720 Tobacco use: Secondary | ICD-10-CM

## 2015-07-05 DIAGNOSIS — J9611 Chronic respiratory failure with hypoxia: Secondary | ICD-10-CM

## 2015-07-05 DIAGNOSIS — M79661 Pain in right lower leg: Secondary | ICD-10-CM

## 2015-07-05 DIAGNOSIS — F1410 Cocaine abuse, uncomplicated: Secondary | ICD-10-CM

## 2015-07-05 DIAGNOSIS — R5381 Other malaise: Secondary | ICD-10-CM

## 2015-07-05 DIAGNOSIS — Z794 Long term (current) use of insulin: Secondary | ICD-10-CM

## 2015-07-05 DIAGNOSIS — J438 Other emphysema: Secondary | ICD-10-CM

## 2015-07-05 DIAGNOSIS — Z01810 Encounter for preprocedural cardiovascular examination: Secondary | ICD-10-CM

## 2015-07-05 DIAGNOSIS — N183 Chronic kidney disease, stage 3 unspecified: Secondary | ICD-10-CM

## 2015-07-05 DIAGNOSIS — F411 Generalized anxiety disorder: Secondary | ICD-10-CM

## 2015-07-05 DIAGNOSIS — I130 Hypertensive heart and chronic kidney disease with heart failure and stage 1 through stage 4 chronic kidney disease, or unspecified chronic kidney disease: Principal | ICD-10-CM | POA: Diagnosis present

## 2015-07-05 DIAGNOSIS — F17200 Nicotine dependence, unspecified, uncomplicated: Secondary | ICD-10-CM

## 2015-07-05 DIAGNOSIS — N2889 Other specified disorders of kidney and ureter: Secondary | ICD-10-CM

## 2015-07-05 DIAGNOSIS — R42 Dizziness and giddiness: Secondary | ICD-10-CM

## 2015-07-05 DIAGNOSIS — Z9114 Patient's other noncompliance with medication regimen: Secondary | ICD-10-CM

## 2015-07-05 DIAGNOSIS — D49519 Neoplasm of unspecified behavior of unspecified kidney: Secondary | ICD-10-CM

## 2015-07-05 DIAGNOSIS — IMO0001 Reserved for inherently not codable concepts without codable children: Secondary | ICD-10-CM

## 2015-07-05 DIAGNOSIS — N179 Acute kidney failure, unspecified: Secondary | ICD-10-CM

## 2015-07-05 HISTORY — DX: Acute and chronic respiratory failure, unspecified whether with hypoxia or hypercapnia: J96.20

## 2015-07-05 LAB — BASIC METABOLIC PANEL
ANION GAP: 9 (ref 5–15)
BUN: 13 mg/dL (ref 6–20)
CHLORIDE: 93 mmol/L — AB (ref 101–111)
CO2: 30 mmol/L (ref 22–32)
Calcium: 9 mg/dL (ref 8.9–10.3)
Creatinine, Ser: 1.58 mg/dL — ABNORMAL HIGH (ref 0.61–1.24)
GFR calc Af Amer: 53 mL/min — ABNORMAL LOW (ref >60)
GFR calc non Af Amer: 46 mL/min — ABNORMAL LOW (ref >60)
GLUCOSE: 314 mg/dL — AB (ref 65–99)
POTASSIUM: 4.5 mmol/L (ref 3.5–5.1)
Sodium: 132 mmol/L — ABNORMAL LOW (ref 135–145)

## 2015-07-05 LAB — CBC WITH DIFFERENTIAL/PLATELET
BASOS PCT: 0 %
Basophils Absolute: 0 10*3/uL (ref 0.0–0.1)
EOS ABS: 0.1 10*3/uL (ref 0.0–0.7)
EOS PCT: 2 %
HEMATOCRIT: 37.7 % — AB (ref 39.0–52.0)
Hemoglobin: 12.4 g/dL — ABNORMAL LOW (ref 13.0–17.0)
Lymphocytes Relative: 19 %
Lymphs Abs: 1.3 10*3/uL (ref 0.7–4.0)
MCH: 28.4 pg (ref 26.0–34.0)
MCHC: 32.9 g/dL (ref 30.0–36.0)
MCV: 86.3 fL (ref 78.0–100.0)
MONO ABS: 0.4 10*3/uL (ref 0.1–1.0)
MONOS PCT: 6 %
Neutro Abs: 4.9 10*3/uL (ref 1.7–7.7)
Neutrophils Relative %: 73 %
Platelets: 156 10*3/uL (ref 150–400)
RBC: 4.37 MIL/uL (ref 4.22–5.81)
RDW: 14 % (ref 11.5–15.5)
WBC: 6.7 10*3/uL (ref 4.0–10.5)

## 2015-07-05 LAB — GLUCOSE, CAPILLARY
GLUCOSE-CAPILLARY: 288 mg/dL — AB (ref 65–99)
Glucose-Capillary: 340 mg/dL — ABNORMAL HIGH (ref 65–99)
Glucose-Capillary: 452 mg/dL — ABNORMAL HIGH (ref 65–99)
Glucose-Capillary: 405 mg/dL — ABNORMAL HIGH (ref 65–99)

## 2015-07-05 LAB — BRAIN NATRIURETIC PEPTIDE: B NATRIURETIC PEPTIDE 5: 70.9 pg/mL (ref 0.0–100.0)

## 2015-07-05 LAB — TROPONIN I: Troponin I: <0.03 ng/mL (ref <0.031)

## 2015-07-05 LAB — CORTISOL: Cortisol, Plasma: 7.6 ug/dL

## 2015-07-05 LAB — PROCALCITONIN: Procalcitonin: 0.19 ng/mL

## 2015-07-05 MED ORDER — POTASSIUM CHLORIDE CRYS ER 20 MEQ PO TBCR
20.0000 meq | EXTENDED_RELEASE_TABLET | Freq: Two times a day (BID) | ORAL | Status: AC
  Administered 2015-07-05 (×2): 20 meq via ORAL
  Filled 2015-07-05 (×2): qty 1

## 2015-07-05 MED ORDER — INSULIN ASPART 100 UNIT/ML ~~LOC~~ SOLN
20.0000 [IU] | Freq: Once | SUBCUTANEOUS | Status: AC
  Administered 2015-07-05: 20 [IU] via SUBCUTANEOUS

## 2015-07-05 MED ORDER — INSULIN ASPART 100 UNIT/ML ~~LOC~~ SOLN
4.0000 [IU] | Freq: Three times a day (TID) | SUBCUTANEOUS | Status: DC
  Administered 2015-07-05 – 2015-07-09 (×13): 4 [IU] via SUBCUTANEOUS

## 2015-07-05 MED ORDER — ALBUTEROL SULFATE (2.5 MG/3ML) 0.083% IN NEBU: 2.5000 mg | INHALATION_SOLUTION | RESPIRATORY_TRACT | Status: DC | PRN

## 2015-07-05 MED ORDER — INSULIN ASPART 100 UNIT/ML ~~LOC~~ SOLN
0.0000 [IU] | Freq: Three times a day (TID) | SUBCUTANEOUS | Status: DC
  Administered 2015-07-05: 15 [IU] via SUBCUTANEOUS
  Administered 2015-07-05: 11 [IU] via SUBCUTANEOUS
  Administered 2015-07-06 (×2): 15 [IU] via SUBCUTANEOUS
  Administered 2015-07-06: 20 [IU] via SUBCUTANEOUS
  Administered 2015-07-06: 15 [IU] via SUBCUTANEOUS

## 2015-07-05 MED ORDER — IPRATROPIUM-ALBUTEROL 0.5-2.5 (3) MG/3ML IN SOLN
3.0000 mL | Freq: Four times a day (QID) | RESPIRATORY_TRACT | Status: DC
  Administered 2015-07-05 – 2015-07-09 (×15): 3 mL via RESPIRATORY_TRACT
  Filled 2015-07-05 (×16): qty 3

## 2015-07-05 MED ORDER — METHYLPREDNISOLONE SODIUM SUCC 40 MG IJ SOLR
40.0000 mg | Freq: Two times a day (BID) | INTRAMUSCULAR | Status: DC
  Administered 2015-07-05 – 2015-07-06 (×2): 40 mg via INTRAVENOUS
  Filled 2015-07-05 (×2): qty 1

## 2015-07-05 MED ORDER — HEPARIN SODIUM (PORCINE) 5000 UNIT/ML IJ SOLN
5000.0000 [IU] | Freq: Three times a day (TID) | INTRAMUSCULAR | Status: DC
  Administered 2015-07-05 – 2015-07-09 (×12): 5000 [IU] via SUBCUTANEOUS
  Filled 2015-07-05 (×13): qty 1

## 2015-07-05 MED ORDER — SODIUM CHLORIDE 0.9 % IV SOLN
INTRAVENOUS | Status: DC
  Administered 2015-07-05: 13:00:00 via INTRAVENOUS

## 2015-07-05 MED ORDER — SODIUM CHLORIDE 0.9 % IV SOLN: 250.0000 mL | INTRAVENOUS | Status: DC | PRN

## 2015-07-05 MED ORDER — FUROSEMIDE 10 MG/ML IJ SOLN
40.0000 mg | Freq: Two times a day (BID) | INTRAMUSCULAR | Status: AC
  Administered 2015-07-05 – 2015-07-06 (×2): 40 mg via INTRAVENOUS
  Filled 2015-07-05 (×2): qty 4

## 2015-07-05 MED ORDER — PERFLUTREN LIPID MICROSPHERE
INTRAVENOUS | Status: AC
  Filled 2015-07-05: qty 10

## 2015-07-05 MED ORDER — PERFLUTREN LIPID MICROSPHERE
1.0000 mL | INTRAVENOUS | Status: AC | PRN
  Administered 2015-07-05: 1 mL via INTRAVENOUS
  Administered 2015-07-05: 4 mL via INTRAVENOUS
  Filled 2015-07-05: qty 10

## 2015-07-05 MED ORDER — ONDANSETRON HCL 4 MG/2ML IJ SOLN: 4.0000 mg | Freq: Four times a day (QID) | INTRAMUSCULAR | Status: DC | PRN

## 2015-07-05 MED ORDER — ACETAMINOPHEN 325 MG PO TABS
650.0000 mg | ORAL_TABLET | ORAL | Status: DC | PRN
  Filled 2015-07-05: qty 2

## 2015-07-05 MED ORDER — ZOLPIDEM TARTRATE 5 MG PO TABS
5.0000 mg | ORAL_TABLET | Freq: Once | ORAL | Status: AC
  Administered 2015-07-05: 5 mg via ORAL
  Filled 2015-07-05: qty 1

## 2015-07-05 NOTE — Progress Notes (Signed)
RT placed patient on CPAP. Patient setting is auto 8-20 cmH2O. 5 Liters oxygen is bleed into tubing. Sterile water added to water chamber for humidification. Patient is tolerating well. RT will continue to monitor.

## 2015-07-05 NOTE — Patient Instructions (Signed)
admit

## 2015-07-05 NOTE — Progress Notes (Signed)
eLink Physician-Brief Progress Note Patient Name: [name] DOB: 09-17-1955 MRN: [id number]   Date of Service  07/05/2015  HPI/Events of Note  Hyperglycemia on steroids Insomnia  eICU Interventions  Plan: 20 additional units of novolog subQ Recheck blood sugar in 1 hour and call results to [facility] MD Ambien 5 mg po times one     Intervention Category Intermediate Interventions: Hyperglycemia - evaluation and treatment  [name] 07/05/2015, 9:34 PM

## 2015-07-05 NOTE — Progress Notes (Signed)
Subjective:     Patient ID: [name], male   DOB: 02/12/1956, 59 y.o.   MRN: [id number]  HPI   OV 04/10/2015  Chief Complaint  Patient presents with  . Follow-up    Pt states his breathing has worsened since last OV. Pt was admitted to [facility] for CP and syncope. Pt c/o increase in DOE, prod cough with dark brown mucus, chest disfomfort when active.    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 At that point in time spirometry showed a significant decline in forced vital capacity from 4 L at baseline to 2.6 L. He was using 4 L of oxygen at rest and 6 L with exertion at home. However we could not convincingly document hypoxemia with exertion on room air in the office at that time. Therefore to this and I ordered pulmonary function tests and CT scan of the chest. He did have CT scan of the chest in August 2016. There is no report on progression compared to February 2016 but I suspect the findings are similar. While he underwent pulmonary function test he had cough syncope and was admitted to the hospital. Cardiology notes reviewed and the final diagnosis was cough syncope. He he now presents for follow-up. He continues to be miserable with class IV dyspnea. He uses 4-6 liters of oxygen because he subjectively feels the need to do that. He does not monitor his pulse ox at home. He says that he continues to have cough syncope at home and has fallen several times. He has now moved to live with his 29 year old mom. He is socially isolated otherwise. He feels like he needs an extra layer of help at home. There are no other issues  Pulse oximetry on room air at rest and walking desaturation test 185 feet 3 laps on room air: RA rest after 15 min off o2, pulse o98% and lowest pulse ox with forehead probe was 90%. He did get dizzy with  coughing   OV 06/05/15 59 year old man with tobacco abuse and history of interstitial lung disease, desquamative interstitial pneumonitis and burnt out Langerhans' cell histiocytosis, hypoxemic respiratory failure; untreated OSA unable to tolerate CPAP. He probably also has some degree of superimposed COPD.  He presents today with two weeks of increased dyspnea, . He is having paroxysms of cough, can lead to pre-syncope / syncope. Last was 2 days ago. He has LE edema, probable more than his baseline. He is using duoneb bid, symbicort bid, albuterol prn - averages once a day. He complains of dry mouth. He took extra lasix after last OV here with TP, helped his edema but not his dyspnea.     OV 07/05/2015  Chief Complaint  Patient presents with  . Follow-up    Pt states his SOB has worsened since last OV. Pt also c/o increase in nonprod cough, pt states he frequently coughs when eating or drinking. Pt c/o right lateral chest pain when coughing. Pt denies f/c/s.   admit    Current outpatient prescriptions:  .  albuterol (PROVENTIL HFA;VENTOLIN HFA) 108 (90 BASE) MCG/ACT inhaler, Inhale 2 puffs into the lungs every 6 (six) hours as needed for wheezing or shortness of breath., Disp: , Rfl:  .  albuterol (PROVENTIL) (2.5 MG/3ML) 0.083% nebulizer solution, Take 3 mLs (2.5 mg total) by nebulization every 3 (three) hours as needed for wheezing or shortness  of breath., Disp: 75 mL, Rfl: 12 .  alfuzosin (UROXATRAL) 10 MG 24 hr tablet, Take 10 mg by mouth daily with breakfast., Disp: , Rfl:  .  ALPRAZolam (XANAX) 0.25 MG tablet, Take 0.25 mg by mouth 2 (two) times daily as needed for anxiety., Disp: , Rfl:  .  atorvastatin (LIPITOR) 20 MG tablet, Take 20 mg by mouth daily., Disp: , Rfl:  .  budesonide-formoterol (SYMBICORT) 160-4.5 MCG/ACT inhaler, Inhale 2 puffs into the lungs 2 (two) times daily., Disp: 1 Inhaler, Rfl: 3 .  cholecalciferol (VITAMIN D) 1000 UNITS tablet, Take 2,000 Units by mouth  daily., Disp: , Rfl:  .  citalopram (CELEXA) 20 MG tablet, Take 40 mg by mouth daily. , Disp: , Rfl:  .  clopidogrel (PLAVIX) 75 MG tablet, Take 1 tablet (75 mg total) by mouth daily., Disp: 30 tablet, Rfl: 11 .  diltiazem (CARDIZEM CD) 240 MG 24 hr capsule, Take 240 mg by mouth daily., Disp: , Rfl:  .  Fish Oil-Cholecalciferol (FISH OIL + D3 PO), Take 1 capsule by mouth 4 (four) times daily. , Disp: , Rfl:  .  FLUoxetine (PROZAC) 10 MG capsule, Take 10 mg by mouth every morning., Disp: , Rfl:  .  furosemide (LASIX) 40 MG tablet, Take 40 mg by mouth daily., Disp: , Rfl:  .  gabapentin (NEURONTIN) 300 MG capsule, Take 300 mg by mouth 2 (two) times daily. ! Tab in morning and 2 tabs at night., Disp: , Rfl:  .  glipiZIDE (GLUCOTROL) 10 MG tablet, Take 10 mg by mouth 2 (two) times daily., Disp: , Rfl:  .  insulin glargine (LANTUS) 100 UNIT/ML injection, Inject 15 Units into the skin daily., Disp: , Rfl:  .  ipratropium-albuterol (DUONEB) 0.5-2.5 (3) MG/3ML SOLN, Take 3 mLs by nebulization every 6 (six) hours as needed (Shortness of breath)., Disp: , Rfl:  .  isosorbide mononitrate (IMDUR) 60 MG 24 hr tablet, Take 1 tablet (60 mg total) by mouth daily., Disp: 90 tablet, Rfl: 3 .  nitroGLYCERIN (NITROSTAT) 0.4 MG SL tablet, Place 1 tablet (0.4 mg total) under the tongue every 5 (five) minutes as needed for chest pain., Disp: 25 tablet, Rfl: 3 .  omeprazole (PRILOSEC) 40 MG capsule, Take 40 mg by mouth daily., Disp: , Rfl:  .  predniSONE (DELTASONE) 10 MG tablet, Take 4 tablets for 5 days, 3 tablets for 5 days, 2 tablets daily until your next OV with Dr. Chase Caller, Disp: 100 tablet, Rfl: 0 .  ranitidine (ZANTAC) 300 MG tablet, Take 300 mg by mouth every morning. , Disp: , Rfl:  .  solifenacin (VESICARE) 5 MG tablet, Take 5 mg by mouth daily., Disp: , Rfl:  .  valsartan (DIOVAN) 40 MG tablet, Take 20 mg by mouth daily. , Disp: , Rfl:    Review of Systems     Objective:   Physical Exam  Filed  Vitals:   07/05/15 0904  BP: 128/80  Pulse: 89  Height: 6' (1.829 m)  Weight: 283 lb (128.368 kg)  SpO2: 92%        Assessment:     x     Plan:     x

## 2015-07-05 NOTE — Progress Notes (Signed)
Echocardiogram 2D Echocardiogram has been performed.  [name] 07/05/2015, 3:30 PM

## 2015-07-05 NOTE — Progress Notes (Signed)
07/05/15  1445  Paged Dr Chase Caller for order for Definity to be used with Echo. Per MD ok to use Definity for Echo.

## 2015-07-05 NOTE — Progress Notes (Signed)
07/05/15 1455  Patient tolerated the Definity with Echo well.

## 2015-07-05 NOTE — H&P (Signed)
PULMONARY / CRITICAL CARE MEDICINE   Name: [name] MRN: [id number] DOB: 1955/12/18    ADMISSION DATE: 07/05/15 CONSULTATION DATE:  07/05/15  CHIEF COMPLAINT:  wprsening dyspnea  HISTORY OF PRESENT ILLNESS:    59 year old male with multiple medical problems with DIP/ILD due to ongoing smoking, OSA but does not use CPA due to fit/claustrophobia iossues, anxiety. Has had high symptom buirden of dyspnea at baseline and feeling miserable NOS. 2016 he had significant decline in his forced vital capacity from 4 L at baseline to 2.6 L. He would use 4 L of oxygen to 6 L of oxygen for subjective dyspnea. However September 2016 visit he continued to have these progressive symptoms including severe cough with some cough syncope that necessitated him to move in with his 16 year old mom [he is socially isolated otherwise]. Nevertheless at that visit in September 2016 we could not document resting or walking hypoxemia. Subsequently November 2016 he saw my colleague Dr. Ria Clock worsening dyspnea and was placed on a prednisone burst that he has continued to 20 mg daily leading up until today 07/05/2015  In the office today he reports that he was diagnosed with diabetes 3 weeks ago and was started on insulin. Since then he's got her 20s-30 pound weight gain. This is associated with worsening dyspnea and cough both are of severe intensity. There is also worsening pedal edema. Resting oxygen saturation on room air was 88% and he started to feel dyspneic. This is definitely updated objectively worse compared to September 2016 there is no fever or sputum production or hemoptysis. In the office he was noted to be sleeping easily and fatigue. He denies being on opioids. He thinks admission to the hospital is a good idea. He is not using his CPAP as documented before      PAST MEDICAL HISTORY :  He  has a past medical history of Essential hypertension, benign; History of stroke; Noncompliance; COPD  (chronic obstructive pulmonary disease) ([place]); Hyperlipidemia; History of pneumonia; Type 2 diabetes mellitus ([place]) (2011); GERD (gastroesophageal reflux disease); Cocaine abuse; Cholecystitis, acute (12/20/2013); Coronary atherosclerosis of native coronary artery; Depression; Anxiety; History of nephrolithiasis; Arthritis; Hepatitis (Late 1970s); Sleep apnea; Renal cell carcinoma ([place]); and Headache.  PAST SURGICAL HISTORY: He  has past surgical history that includes Appendectomy (1970's); Cholecystectomy (N/A, 12/22/2013); Robot assisted laparoscopic nephrectomy (Right, 02/21/2014); Laparoscopic lysis of adhesions (02/21/2014); Video assisted thoracoscopy (Left, 05/18/2014); Lung biopsy (Left, 05/18/2014); Video bronchoscopy (05/18/2014); left heart catheterization with coronary angiogram (N/A, 06/02/2012); left heart catheterization with coronary angiogram (N/A, 03/09/2013); left heart catheterization with coronary angiogram (N/A, 12/20/2013); Endarterectomy (Right, 10/08/2014); and Patch angioplasty (Right, 10/08/2014).  Allergies  Allergen Reactions  . Oxycodone Itching and Nausea Only    No current facility-administered medications on file prior to encounter.   Current Outpatient Prescriptions on File Prior to Encounter  Medication Sig  . albuterol (PROVENTIL HFA;VENTOLIN HFA) 108 (90 BASE) MCG/ACT inhaler Inhale 2 puffs into the lungs every 6 (six) hours as needed for wheezing or shortness of breath.  [name] albuterol (PROVENTIL) (2.5 MG/3ML) 0.083% nebulizer solution Take 3 mLs (2.5 mg total) by nebulization every 3 (three) hours as needed for wheezing or shortness of breath.  . alfuzosin (UROXATRAL) 10 MG 24 hr tablet Take 10 mg by mouth daily with breakfast.  . ALPRAZolam (XANAX) 0.25 MG tablet Take 0.25 mg by mouth 2 (two) times daily as needed for anxiety.  [name] atorvastatin (LIPITOR) 20 MG tablet Take 20 mg by mouth daily.  [name]  budesonide-formoterol (SYMBICORT) 160-4.5 MCG/ACT inhaler Inhale 2 puffs into  the lungs 2 (two) times daily.  . cholecalciferol (VITAMIN D) 1000 UNITS tablet Take 2,000 Units by mouth daily.  . citalopram (CELEXA) 20 MG tablet Take 40 mg by mouth daily.   . clopidogrel (PLAVIX) 75 MG tablet Take 1 tablet (75 mg total) by mouth daily.  [name] diltiazem (CARDIZEM CD) 240 MG 24 hr capsule Take 240 mg by mouth daily.  . Fish Oil-Cholecalciferol (FISH OIL + D3 PO) Take 1 capsule by mouth 4 (four) times daily.   [name] FLUoxetine (PROZAC) 10 MG capsule Take 10 mg by mouth every morning.  . furosemide (LASIX) 40 MG tablet Take 40 mg by mouth daily.  [name] gabapentin (NEURONTIN) 300 MG capsule Take 300 mg by mouth 2 (two) times daily. ! Tab in morning and 2 tabs at night.  [name] glipiZIDE (GLUCOTROL) 10 MG tablet Take 10 mg by mouth 2 (two) times daily.  . insulin glargine (LANTUS) 100 UNIT/ML injection Inject 15 Units into the skin daily.  [name] ipratropium-albuterol (DUONEB) 0.5-2.5 (3) MG/3ML SOLN Take 3 mLs by nebulization every 6 (six) hours as needed (Shortness of breath).  . isosorbide mononitrate (IMDUR) 60 MG 24 hr tablet Take 1 tablet (60 mg total) by mouth daily.  . nitroGLYCERIN (NITROSTAT) 0.4 MG SL tablet Place 1 tablet (0.4 mg total) under the tongue every 5 (five) minutes as needed for chest pain.  [name] omeprazole (PRILOSEC) 40 MG capsule Take 40 mg by mouth daily.  . predniSONE (DELTASONE) 10 MG tablet Take 4 tablets for 5 days, 3 tablets for 5 days, 2 tablets daily until your next OV with Dr. Chase Caller  . ranitidine (ZANTAC) 300 MG tablet Take 300 mg by mouth every morning.   . solifenacin (VESICARE) 5 MG tablet Take 5 mg by mouth daily.  . valsartan (DIOVAN) 40 MG tablet Take 20 mg by mouth daily.     FAMILY HISTORY:  His indicated that his mother is alive.   SOCIAL HISTORY: He  reports that he has been smoking Cigarettes.  He started smoking about 43 years ago. He has a 20.5 pack-year smoking history. He has never used smokeless tobacco. He reports that he does not drink alcohol  or use illicit drugs.  REVIEW OF SYSTEMS:   According to history of present illness  SUBJECTIVE:  According to history of present illness  VITAL SIGNS: There were no vitals taken for this visit.  Vital signs documented in the pulmonary office There were no vitals filed for this visit.  Blood pressure 120/62 Pulse 80/m O2r 96% on6 L and 88% on room air in 10 minutes and he got dyspneic  HEMODYNAMICS:    VENTILATOR SETTINGS:    INTAKE / OUTPUT:    PHYSICAL EXAMINATION: General:  Obese male sitting. Falling asleep easily Neuro:  Alert and oriented 3. Able to give a good history but falls asleep easily HEENT:  Oxygen on. No neck nodes no elevated JVP Cardiovascular:  Normal heart sounds regular rate and rhythm Lungs:  Bilateral wheezing present. This is new Abdomen:  Obese soft nontender nor megaly Musculoskeletal:  2+ pitting pedal edema bilaterally Skin:  Intact in exposed areas  LABS:  BMET No results for input(s): NA, K, CL, CO2, BUN, CREATININE, GLUCOSE in the last 168 hours.  Electrolytes No results for input(s): CALCIUM, MG, PHOS in the last 168 hours.  CBC No results for input(s): WBC, HGB, HCT, PLT in the last 168 hours.  Coag's No results  for input(s): APTT, INR in the last 168 hours.  Sepsis Markers No results for input(s): LATICACIDVEN, PROCALCITON, O2SATVEN in the last 168 hours.  ABG No results for input(s): PHART, PCO2ART, PO2ART in the last 168 hours.  Liver Enzymes No results for input(s): AST, ALT, ALKPHOS, BILITOT, ALBUMIN in the last 168 hours.  Cardiac Enzymes No results for input(s): TROPONINI, PROBNP in the last 168 hours.  Glucose No results for input(s): GLUCAP in the last 168 hours.  Imaging No results found.    ASSESSMENT / PLAN:  PULMONARY A: #Baseline - DIP with Langerhans' cells histiocytosis interstitial lung disease with ongoing smoking - Subjective use of 4-6 liters of oxygen for class III-IV subjective dyspnea  - unable to control - Possible baseline COPD - OSA orOHV ; does not use CPAP  #current - Acute on chronic hypoxemic respiratory failure with possible element of hypercapnia; either ILD flare or diastolic heart failure associated with recent weight gain secondary to insulin injection and prednisone   P:   Admit Check ABG Overnight CPAP needed Bronchodilators IV steroids IV Lasix Based on above  will consider high-resolution CT chest/pulmonary function test while inpatient  CARDIOVASCULAR A:  Possible acute on chronic diastolic heart failure explaining acute on chronic hypoxemic respiratory failure P:  Aggressive Lasix Rule out MI Get echo  RENAL A:   At risk for renal insufficiency and I like lead abnormalities P:   Check chemistries  GASTROINTESTINAL A:   diabetes P:   Low carb diabetic diet  HEMATOLOGIC A:   At risk for anemia of critical illness and DVT P:  DVT prophylaxis Check CBC  INFECTIOUS A:   No evidence of infection P:   Check lactic acid and pro calcitonin  ENDOCRINE A:   diabetes   P:   Sliding scale insulin  NEUROLOGIC A:   Sleepy and has anxious predisposition at baseline P:   RASS goal: 0 Check ABG Anxiety treatment as needed  Social - He socially isolated and lives with his 15 year old mom. His mom drives him around. P =-Social work consult    pccm primary for now    Dr. Brand Males, M.D., [facility].C.P Pulmonary and Critical Care Medicine Staff Physician [place] [facility] Pager: [phone], If no answer or between  15:00h - 7:00h: call [phone]  07/05/2015 9:33 AM

## 2015-07-06 ENCOUNTER — Inpatient Hospital Stay (HOSPITAL_COMMUNITY): Payer: Medicaid Other

## 2015-07-06 DIAGNOSIS — E872 Acidosis: Secondary | ICD-10-CM

## 2015-07-06 DIAGNOSIS — N183 Chronic kidney disease, stage 3 unspecified: Secondary | ICD-10-CM

## 2015-07-06 DIAGNOSIS — J441 Chronic obstructive pulmonary disease with (acute) exacerbation: Secondary | ICD-10-CM

## 2015-07-06 LAB — BASIC METABOLIC PANEL
ANION GAP: 12 (ref 5–15)
BUN: 16 mg/dL (ref 6–20)
CHLORIDE: 96 mmol/L — AB (ref 101–111)
CO2: 27 mmol/L (ref 22–32)
Calcium: 9.5 mg/dL (ref 8.9–10.3)
Creatinine, Ser: 1.71 mg/dL — ABNORMAL HIGH (ref 0.61–1.24)
GFR calc non Af Amer: 42 mL/min — ABNORMAL LOW (ref >60)
GFR, EST AFRICAN AMERICAN: 48 mL/min — AB (ref >60)
Glucose, Bld: 313 mg/dL — ABNORMAL HIGH (ref 65–99)
Potassium: 4.7 mmol/L (ref 3.5–5.1)
Sodium: 135 mmol/L (ref 135–145)

## 2015-07-06 LAB — MRSA PCR SCREENING: MRSA BY PCR: NEGATIVE

## 2015-07-06 LAB — GLUCOSE, CAPILLARY
GLUCOSE-CAPILLARY: 288 mg/dL — AB (ref 65–99)
GLUCOSE-CAPILLARY: 297 mg/dL — AB (ref 65–99)
GLUCOSE-CAPILLARY: 310 mg/dL — AB (ref 65–99)
GLUCOSE-CAPILLARY: 324 mg/dL — AB (ref 65–99)
Glucose-Capillary: 345 mg/dL — ABNORMAL HIGH (ref 65–99)
Glucose-Capillary: 367 mg/dL — ABNORMAL HIGH (ref 65–99)

## 2015-07-06 LAB — CBC
HEMATOCRIT: 40.8 % (ref 39.0–52.0)
HEMOGLOBIN: 13.4 g/dL (ref 13.0–17.0)
MCH: 28.3 pg (ref 26.0–34.0)
MCHC: 32.8 g/dL (ref 30.0–36.0)
MCV: 86.3 fL (ref 78.0–100.0)
Platelets: 159 10*3/uL (ref 150–400)
RBC: 4.73 MIL/uL (ref 4.22–5.81)
RDW: 13.8 % (ref 11.5–15.5)
WBC: 7.9 10*3/uL (ref 4.0–10.5)

## 2015-07-06 LAB — BLOOD GAS, ARTERIAL
Acid-Base Excess: 1.5 mmol/L (ref 0.0–2.0)
Bicarbonate: 25.2 mEq/L — ABNORMAL HIGH (ref 20.0–24.0)
Drawn by: 331471
O2 Content: 5 L/min
O2 Saturation: 86.4 %
PATIENT TEMPERATURE: 37
PCO2 ART: 38.5 mmHg (ref 35.0–45.0)
PH ART: 7.431 (ref 7.350–7.450)
PO2 ART: 52.1 mmHg — AB (ref 80.0–100.0)
TCO2: 22.1 mmol/L (ref 0–100)

## 2015-07-06 LAB — LACTIC ACID, PLASMA: Lactic Acid, Venous: 3.7 mmol/L (ref 0.5–2.0)

## 2015-07-06 LAB — TROPONIN I: Troponin I: <0.03 ng/mL (ref <0.031)

## 2015-07-06 MED ORDER — VITAMIN D3 25 MCG (1000 UNIT) PO TABS
2000.0000 [IU] | ORAL_TABLET | Freq: Every day | ORAL | Status: DC
  Administered 2015-07-06 – 2015-07-09 (×4): 2000 [IU] via ORAL
  Filled 2015-07-06 (×4): qty 2

## 2015-07-06 MED ORDER — IRBESARTAN 75 MG PO TABS
37.5000 mg | ORAL_TABLET | Freq: Every day | ORAL | Status: DC
  Administered 2015-07-06 – 2015-07-09 (×4): 37.5 mg via ORAL
  Filled 2015-07-06 (×4): qty 0.5

## 2015-07-06 MED ORDER — GABAPENTIN 300 MG PO CAPS
300.0000 mg | ORAL_CAPSULE | Freq: Every morning | ORAL | Status: DC
  Administered 2015-07-07 – 2015-07-09 (×3): 300 mg via ORAL
  Filled 2015-07-06 (×3): qty 1

## 2015-07-06 MED ORDER — CLOPIDOGREL BISULFATE 75 MG PO TABS
75.0000 mg | ORAL_TABLET | Freq: Every day | ORAL | Status: DC
  Administered 2015-07-06 – 2015-07-09 (×4): 75 mg via ORAL
  Filled 2015-07-06 (×4): qty 1

## 2015-07-06 MED ORDER — ALFUZOSIN HCL ER 10 MG PO TB24
10.0000 mg | ORAL_TABLET | Freq: Every day | ORAL | Status: DC
  Administered 2015-07-07 – 2015-07-09 (×3): 10 mg via ORAL
  Filled 2015-07-06 (×4): qty 1

## 2015-07-06 MED ORDER — FAMOTIDINE 20 MG PO TABS
20.0000 mg | ORAL_TABLET | Freq: Every day | ORAL | Status: DC
  Administered 2015-07-06 – 2015-07-08 (×3): 20 mg via ORAL
  Filled 2015-07-06 (×3): qty 1

## 2015-07-06 MED ORDER — INSULIN GLARGINE 100 UNIT/ML ~~LOC~~ SOLN
10.0000 [IU] | Freq: Every day | SUBCUTANEOUS | Status: DC
  Administered 2015-07-06: 10 [IU] via SUBCUTANEOUS
  Filled 2015-07-06 (×2): qty 0.1

## 2015-07-06 MED ORDER — NITROGLYCERIN 0.4 MG SL SUBL: 0.4000 mg | SUBLINGUAL_TABLET | SUBLINGUAL | Status: DC | PRN

## 2015-07-06 MED ORDER — GABAPENTIN 300 MG PO CAPS
600.0000 mg | ORAL_CAPSULE | Freq: Every day | ORAL | Status: DC
  Administered 2015-07-06 – 2015-07-08 (×3): 600 mg via ORAL
  Filled 2015-07-06 (×3): qty 2

## 2015-07-06 MED ORDER — DILTIAZEM HCL ER COATED BEADS 240 MG PO CP24
240.0000 mg | ORAL_CAPSULE | Freq: Every day | ORAL | Status: DC
  Administered 2015-07-07 – 2015-07-09 (×3): 240 mg via ORAL
  Filled 2015-07-06: qty 2
  Filled 2015-07-06: qty 1
  Filled 2015-07-06: qty 2
  Filled 2015-07-06: qty 1

## 2015-07-06 MED ORDER — ISOSORBIDE MONONITRATE ER 60 MG PO TB24
60.0000 mg | ORAL_TABLET | Freq: Every day | ORAL | Status: DC
  Administered 2015-07-07 – 2015-07-09 (×3): 60 mg via ORAL
  Filled 2015-07-06 (×4): qty 1

## 2015-07-06 MED ORDER — BUDESONIDE-FORMOTEROL FUMARATE 160-4.5 MCG/ACT IN AERO
2.0000 | INHALATION_SPRAY | Freq: Two times a day (BID) | RESPIRATORY_TRACT | Status: DC
  Administered 2015-07-06 – 2015-07-09 (×6): 2 via RESPIRATORY_TRACT
  Filled 2015-07-06: qty 6

## 2015-07-06 MED ORDER — CITALOPRAM HYDROBROMIDE 40 MG PO TABS
40.0000 mg | ORAL_TABLET | Freq: Every day | ORAL | Status: DC
  Administered 2015-07-06 – 2015-07-09 (×4): 40 mg via ORAL
  Filled 2015-07-06 (×2): qty 1
  Filled 2015-07-06 (×2): qty 2

## 2015-07-06 MED ORDER — ATORVASTATIN CALCIUM 20 MG PO TABS
20.0000 mg | ORAL_TABLET | Freq: Every day | ORAL | Status: DC
  Administered 2015-07-06 – 2015-07-09 (×4): 20 mg via ORAL
  Filled 2015-07-06 (×2): qty 1
  Filled 2015-07-06 (×2): qty 2

## 2015-07-06 MED ORDER — NITROGLYCERIN 0.4 MG SL SUBL
SUBLINGUAL_TABLET | SUBLINGUAL | Status: AC
  Filled 2015-07-06: qty 1

## 2015-07-06 MED ORDER — DARIFENACIN HYDROBROMIDE ER 7.5 MG PO TB24
7.5000 mg | ORAL_TABLET | Freq: Every day | ORAL | Status: DC
  Administered 2015-07-06 – 2015-07-09 (×4): 7.5 mg via ORAL
  Filled 2015-07-06 (×4): qty 1

## 2015-07-06 MED ORDER — FLUOXETINE HCL 10 MG PO CAPS
10.0000 mg | ORAL_CAPSULE | Freq: Every morning | ORAL | Status: DC
  Administered 2015-07-07 – 2015-07-09 (×3): 10 mg via ORAL
  Filled 2015-07-06 (×4): qty 1

## 2015-07-06 MED ORDER — PREDNISONE 20 MG PO TABS: 40.0000 mg | ORAL_TABLET | Freq: Every day | ORAL | Status: DC

## 2015-07-06 NOTE — Progress Notes (Signed)
Critical lactic acid 3.7. Complains of sharp chest pain. Pulmonary MD at bedside.  EKG obtained. Order for blood gas and transfer to ICU. Callie Fielding RN

## 2015-07-06 NOTE — Progress Notes (Signed)
Last night, patient's BS was taken at 2030, and it was 452. He had no night coverage. On call was notified and new orders were given to give 20 units of insulin and recheck in an hr and call back. BS was rechecked and it was 405, on call was notified again and new orders were given for another 20 units of novolog and to recheck in an hr and call back. At [place], BS was 345. On call made aware and gave a care order to give what the sliding scale insulin tells to give and it said give 15 units. It was given and BS was rechecked at 0207, and it was 288. Will continue to monitor.

## 2015-07-06 NOTE — Progress Notes (Signed)
This morning patient was complaining of a 10 out of 10 chest pressure radiating down the right arm. BP was 188/97, HR 91, and oxygen 94% 5 L. EKG was taken and during this time patient stated his chest pain had subsided. By the end of getting the EKG, the chest pain was gone. Patient stated that it happened a lot at home and that he takes a nitro. On call was notified, no new orders given.

## 2015-07-06 NOTE — Progress Notes (Addendum)
PULMONARY / CRITICAL CARE MEDICINE   Name: [name] MRN: [id number] DOB: Aug 02, 1955    ADMISSION DATE: 07/05/15 CONSULTATION DATE:  07/05/15  CHIEF COMPLAINT:  wprsening dyspnea  HISTORY OF PRESENT ILLNESS:    59 year old male with multiple medical problems with DIP/ILD due to ongoing smoking, OSA but does not use CPA due to fit/claustrophobia iossues, anxiety. Has had high symptom buirden of dyspnea at baseline and feeling miserable NOS. 2016 he had significant decline in his forced vital capacity from 4 L at baseline to 2.6 L. He would use 4 L of oxygen to 6 L of oxygen for subjective dyspnea. However September 2016 visit he continued to have these progressive symptoms including severe cough with some cough syncope that necessitated him to move in with his 3 year old mom [he is socially isolated otherwise]. Nevertheless at that visit in September 2016 we could not document resting or walking hypoxemia. Subsequently November 2016 he saw my colleague Dr. Ria Clock worsening dyspnea and was placed on a prednisone burst that he has continued to 20 mg daily leading up until today 07/05/2015  In the office 12/16 he reports that he was diagnosed with diabetes 3 weeks ago and was started on insulin. Since then he's got her 20s-30 pound weight gain. This is associated with worsening dyspnea and cough both are of severe intensity. There is also worsening pedal edema. Resting oxygen saturation on room air was 88% and he started to feel dyspneic. This is definitely updated objectively worse compared to September 2016 there is no fever or sputum production or hemoptysis. In the office he was noted to be sleeping easily and fatigue. He denies being on opioids. He thinks admission to the hospital is a good idea. He is not using his CPAP as documented before    VITAL SIGNS: BP 159/91 mmHg  Pulse 98  Temp(Src) 97.4 F (36.3 C) (Oral)  Resp 20  Ht 6' (1.829 m)  Wt 275 lb 9.2 oz (125 kg)  BMI  37.37 kg/m2  SpO2 92%  Vital signs documented in the pulmonary office Filed Vitals:   07/06/15 0452 07/06/15 0506 07/06/15 0814 07/06/15 0815  BP: 188/97 159/91    Pulse: 91   98  Temp: 97.4 F (36.3 C)     TempSrc: Oral     Resp: 24   20  Height:      Weight: 275 lb 9.2 oz (125 kg)     SpO2: 94%  86% 92%        VENTILATOR SETTINGS:    INTAKE / OUTPUT: I/O last 3 completed shifts: In: 142.5 [P.O.:120; I.V.:22.5] Out: 2250 [Urine:2250]  PHYSICAL EXAMINATION: General:  Obese male in bed but wide awake Neuro:  Alert and oriented 3.  HEENT:  Oxygen on. No neck nodes no elevated JVP Cardiovascular:  Normal heart sounds regular rate and rhythm Lungs:  NO wheezing present.  Abdomen:  Obese soft nontender nor megaly Musculoskeletal:  2+ pitting pedal edema bilaterally, DECREASED 12/17 Skin:  Intact in exposed areas  LABS:  BMET  Recent Labs Lab 07/05/15 1115 07/06/15 0533  NA 132* 135  K 4.5 4.7  CL 93* 96*  CO2 30 27  BUN 13 16  CREATININE 1.58* 1.71*  GLUCOSE 314* 313*    Electrolytes  Recent Labs Lab 07/05/15 1115 07/06/15 0533  CALCIUM 9.0 9.5    CBC  Recent Labs Lab 07/05/15 1115 07/06/15 0533  WBC 6.7 7.9  HGB 12.4* 13.4  HCT 37.7* 40.8  PLT 156 159    Coag's No results for input(s): APTT, INR in the last 168 hours.  Sepsis Markers  Recent Labs Lab 07/05/15 1115  PROCALCITON 0.19    ABG No results for input(s): PHART, PCO2ART, PO2ART in the last 168 hours.  Liver Enzymes No results for input(s): AST, ALT, ALKPHOS, BILITOT, ALBUMIN in the last 168 hours.  Cardiac Enzymes  Recent Labs Lab 07/05/15 1115  TROPONINI <0.03    Glucose  Recent Labs Lab 07/05/15 2030 07/05/15 2242 07/06/15 0010 07/06/15 0207 07/06/15 0509 07/06/15 0728  GLUCAP 452* 405* 345* 288* 297* 310*    Imaging Dg Chest 2 View  07/05/2015  CLINICAL DATA:  Shortness of breath. History of renal cell carcinoma. EXAM: CHEST  2 VIEW  COMPARISON:  June 05, 2015. FINDINGS: Stable cardiomegaly. No pneumothorax or pleural effusion is noted. Stable interstitial densities are noted in the lung bases most consistent with scarring. No acute pulmonary disease is noted. Bony thorax is unremarkable. IMPRESSION: No active cardiopulmonary disease. Electronically Signed   By: Marijo Conception, M.D.   On: 07/05/2015 12:48      ASSESSMENT / PLAN:  PULMONARY A: #Baseline - DIP with Langerhans' cells histiocytosis interstitial lung disease with ongoing smoking - Subjective use of 4-6 liters of oxygen for class III-IV subjective dyspnea - unable to control - Possible baseline COPD - OSA orOHV ; does not use CPAP  #current - Acute on chronic hypoxemic respiratory failure with possible element of hypercapnia; either ILD flare or diastolic heart failure associated with recent weight gain secondary to insulin injection and prednisone   P:   Overnight CPAP needed Cont Bronchodilators D/c steroids Cont IV Lasix   CARDIOVASCULAR  Intake/Output Summary (Last 24 hours) at 07/06/15 0941 Last data filed at 07/06/15 0800  Gross per 24 hour  Intake  142.5 ml  Output   2600 ml  Net -2457.5 ml    A:  Possible acute on chronic diastolic heart failure explaining acute on chronic hypoxemic respiratory failure Ongoing chest pain Lactic acidosis WBC ok  Cardiac enzymes normal Pt is hypertensive ECG ok Note pt was not continued on any of his home  Cardiac meds on admit  P:  Aggressive Lasix, better 12/17, cont same Resume ALL home PO cardiac meds  Echo grade 1 dis dysfunc  RENAL Lab Results  Component Value Date   CREATININE 1.71* 07/06/2015   CREATININE 1.58* 07/05/2015   CREATININE 1.69* 05/10/2015   CREATININE 2.67* 09/26/2014    A:   CKD stage III P:   Monitor  GASTROINTESTINAL A:   No acute issues   P:   Low carb diabetic diet  HEMATOLOGIC A:   At risk for anemia of critical illness and DVT P:  DVT  prophylaxis Resume plavix, not cont on admit   INFECTIOUS A:   No evidence of infection, PCT normal, but Lactic acid 3.7   P:   Monitor off ABX  ENDOCRINE CBG (last 3)   Recent Labs  07/06/15 0207 07/06/15 0509 07/06/15 0728  GLUCAP 288* 297* 310*   A:   diabetes  Type II , poor control on steroids. P:   Sliding scale insulin change to resistant 12/17 Decrease steroids Lantus 10 units daily started 12/17  NEUROLOGIC A:   has anxious predisposition at baseline P:   RASS goal: 0 Anxiety treatment as needed  Social - He socially isolated and lives with his 59 year old mom. His mom drives him around. P -Social work consult  tfr to SDU for closer monitoring with high lactate Suspect lact of access to cardiac meds on admit led to decompensation   pccm primary for now.  CC 40 min  Glyn Ade  [phone]  Cell  [phone]  If no response or cell goes to voicemail, call beeper [phone]   07/06/2015, 9:36 AM

## 2015-07-07 DIAGNOSIS — N183 Chronic kidney disease, stage 3 (moderate): Secondary | ICD-10-CM

## 2015-07-07 DIAGNOSIS — J9620 Acute and chronic respiratory failure, unspecified whether with hypoxia or hypercapnia: Secondary | ICD-10-CM

## 2015-07-07 LAB — BASIC METABOLIC PANEL
ANION GAP: 9 (ref 5–15)
BUN: 23 mg/dL — ABNORMAL HIGH (ref 6–20)
CALCIUM: 9.3 mg/dL (ref 8.9–10.3)
CO2: 28 mmol/L (ref 22–32)
Chloride: 95 mmol/L — ABNORMAL LOW (ref 101–111)
Creatinine, Ser: 1.64 mg/dL — ABNORMAL HIGH (ref 0.61–1.24)
GFR calc Af Amer: 51 mL/min — ABNORMAL LOW (ref >60)
GFR calc non Af Amer: 44 mL/min — ABNORMAL LOW (ref >60)
GLUCOSE: 348 mg/dL — AB (ref 65–99)
Potassium: 5.1 mmol/L (ref 3.5–5.1)
Sodium: 132 mmol/L — ABNORMAL LOW (ref 135–145)

## 2015-07-07 LAB — CBC
HEMATOCRIT: 40.9 % (ref 39.0–52.0)
HEMOGLOBIN: 13.3 g/dL (ref 13.0–17.0)
MCH: 28.4 pg (ref 26.0–34.0)
MCHC: 32.5 g/dL (ref 30.0–36.0)
MCV: 87.4 fL (ref 78.0–100.0)
Platelets: 168 10*3/uL (ref 150–400)
RBC: 4.68 MIL/uL (ref 4.22–5.81)
RDW: 14.2 % (ref 11.5–15.5)
WBC: 10.2 10*3/uL (ref 4.0–10.5)

## 2015-07-07 LAB — GLUCOSE, CAPILLARY
GLUCOSE-CAPILLARY: 308 mg/dL — AB (ref 65–99)
GLUCOSE-CAPILLARY: 313 mg/dL — AB (ref 65–99)
GLUCOSE-CAPILLARY: 327 mg/dL — AB (ref 65–99)
GLUCOSE-CAPILLARY: 331 mg/dL — AB (ref 65–99)
GLUCOSE-CAPILLARY: 381 mg/dL — AB (ref 65–99)

## 2015-07-07 LAB — PROCALCITONIN: Procalcitonin: 0.15 ng/mL

## 2015-07-07 MED ORDER — FUROSEMIDE 10 MG/ML IJ SOLN
40.0000 mg | Freq: Two times a day (BID) | INTRAMUSCULAR | Status: DC
  Administered 2015-07-07 – 2015-07-08 (×3): 40 mg via INTRAVENOUS
  Filled 2015-07-07 (×3): qty 4

## 2015-07-07 MED ORDER — INSULIN ASPART 100 UNIT/ML ~~LOC~~ SOLN
0.0000 [IU] | Freq: Three times a day (TID) | SUBCUTANEOUS | Status: DC
  Administered 2015-07-07: 20 [IU] via SUBCUTANEOUS
  Administered 2015-07-07 (×2): 15 [IU] via SUBCUTANEOUS
  Administered 2015-07-08: 11 [IU] via SUBCUTANEOUS
  Administered 2015-07-08: 20 [IU] via SUBCUTANEOUS
  Administered 2015-07-08 – 2015-07-09 (×3): 11 [IU] via SUBCUTANEOUS

## 2015-07-07 MED ORDER — INSULIN GLARGINE 100 UNIT/ML ~~LOC~~ SOLN
20.0000 [IU] | Freq: Every day | SUBCUTANEOUS | Status: DC
  Administered 2015-07-07 – 2015-07-09 (×3): 20 [IU] via SUBCUTANEOUS
  Filled 2015-07-07 (×4): qty 0.2

## 2015-07-07 MED ORDER — INSULIN ASPART 100 UNIT/ML ~~LOC~~ SOLN
0.0000 [IU] | Freq: Every day | SUBCUTANEOUS | Status: DC
  Administered 2015-07-07: 4 [IU] via SUBCUTANEOUS
  Administered 2015-07-08: 2 [IU] via SUBCUTANEOUS

## 2015-07-07 NOTE — Progress Notes (Signed)
Placed patient on CPAP for the night.  Patient is tolerating well at this time. 

## 2015-07-07 NOTE — Progress Notes (Signed)
PULMONARY / CRITICAL CARE MEDICINE   Name: [name] MRN: [id number] DOB: May 19, 1956    ADMISSION DATE: 07/05/15 CONSULTATION DATE:  07/05/15  CHIEF COMPLAINT:  wprsening dyspnea  HISTORY OF PRESENT ILLNESS:    58 year old male with multiple medical problems with DIP/ILD due to ongoing smoking, OSA but does not use CPA due to fit/claustrophobia issues, anxiety. Has had high symptom buirden of dyspnea at baseline and feeling miserable NOS. 2016 he had significant decline in his forced vital capacity from 4 L at baseline to 2.6 L. He would use 4 L of oxygen to 6 L of oxygen for subjective dyspnea. However September 2016 visit he continued to have these progressive symptoms including severe cough Admit to floor decompensation, home meds not restarted, moved to sdu for further monitoring.   VITAL SIGNS: BP 156/83 mmHg  Pulse 80  Temp(Src) 98.2 F (36.8 C) (Axillary)  Resp 14  Ht 6' (1.829 m)  Wt 123.4 kg (272 lb 0.8 oz)  BMI 36.89 kg/m2  SpO2 93%  Vital signs documented in the pulmonary office Filed Vitals:   07/07/15 0500 07/07/15 0600 07/07/15 0700 07/07/15 0800  BP:  156/83    Pulse: 77 80 80   Temp:    98.2 F (36.8 C)  TempSrc:    Axillary  Resp: 11 13 14    Height:      Weight: 123.4 kg (272 lb 0.8 oz)     SpO2: 93% 91% 93%      VENTILATOR SETTINGS:    INTAKE / OUTPUT: I/O last 3 completed shifts: In: 1060 [P.O.:960; I.V.:100] Out: 5300 [Urine:5300]  PHYSICAL EXAMINATION: General:  Obese male, no distress Neuro:  Alert and oriented 3.  HEENT:  jvd difficult to assess Cardiovascular:  s1 s2 RRR distant Lungs:  NO wheezing, reduced Abdomen:  Obese soft nontender nor megaly Musculoskeletal:  1 pitting pedal edema bilaterally  Skin:  Intact in exposed areas  LABS:  BMET  Recent Labs Lab 07/05/15 1115 07/06/15 0533 07/07/15 0406  NA 132* 135 132*  K 4.5 4.7 5.1  CL 93* 96* 95*  CO2 30 27 28   BUN 13 16 23*  CREATININE 1.58* 1.71* 1.64*   GLUCOSE 314* 313* 348*    Electrolytes  Recent Labs Lab 07/05/15 1115 07/06/15 0533 07/07/15 0406  CALCIUM 9.0 9.5 9.3    CBC  Recent Labs Lab 07/05/15 1115 07/06/15 0533 07/07/15 0406  WBC 6.7 7.9 10.2  HGB 12.4* 13.4 13.3  HCT 37.7* 40.8 40.9  PLT 156 159 168    Coag's No results for input(s): APTT, INR in the last 168 hours.  Sepsis Markers  Recent Labs Lab 07/05/15 1115 07/06/15 1010 07/07/15 0406  LATICACIDVEN  --  3.7*  --   PROCALCITON 0.19  --  0.15    ABG  Recent Labs Lab 07/06/15 1140  PHART 7.431  PCO2ART 38.5  PO2ART 52.1*    Liver Enzymes No results for input(s): AST, ALT, ALKPHOS, BILITOT, ALBUMIN in the last 168 hours.  Cardiac Enzymes  Recent Labs Lab 07/05/15 1115 07/06/15 1143  TROPONINI <0.03 <0.03    Glucose  Recent Labs Lab 07/06/15 0509 07/06/15 0728 07/06/15 1204 07/06/15 1802 07/07/15 0007 07/07/15 0727  GLUCAP 297* 310* 367* 324* 331* 308*    Imaging Dg Chest Port 1 View  07/06/2015  CLINICAL DATA:  Shortness of Breath EXAM: PORTABLE CHEST - 1 VIEW COMPARISON:  07/05/2015 FINDINGS: Cardiac shadow is within normal limits. Lungs are well aerated bilaterally. Mild  interstitial edema is noted. No focal infiltrate or sizable effusion is seen. IMPRESSION: Mild interstitial changes Electronically Signed   By: Inez Catalina M.D.   On: 07/06/2015 12:02      ASSESSMENT / PLAN:  PULMONARY A: #Baseline - DIP with Langerhans' cells histiocytosis interstitial lung disease with ongoing smoking - Subjective use of 4-6 liters of oxygen for class III-IV subjective dyspnea - unable to control - Possible baseline COPD - OSA orOHV ; does not use CPAP  #current - Acute on chronic hypoxemic respiratory failure with possible element of hypercapnia; either ILD flare or diastolic heart failure associated with recent weight gain secondary to insulin injection and prednisone  Favor diastolic HF exacerbation  P:    Overnight CPAP needed, used successfuly Cont Bronchodilators Cont IV Lasix, he was neg 2 liters about and pcxr concern to me is int edema, fluid fissure rt, now clinically improved with lasix pcxr follow up in am O2 6 liters, he uses 4-6 No need repeat abg Continued neg balance next 24 hr, goal 1 liter neg Control afterload  CARDIOVASCULAR  Intake/Output Summary (Last 24 hours) at 07/07/15 [id number] Last data filed at 07/07/15 0300  Gross per 24 hour  Intake    820 ml  Output   2700 ml  Net  -1880 ml    A:  Possible acute on chronic diastolic heart failure explaining acute on chronic hypoxemic respiratory failure Chest pain resolved Cardiac enzymes normal Pt is hypertensive Note pt was not continued on any of his home  Cardiac meds on admit  P:  Aggressive Lasix, better 12/17, cont same, maybe slight reduction Resume ALL home PO cardiac meds - so important Echo grade 1 dis dysfunc Consider sys better control, may need to max dilt or add hydral depending on HR Tele keep  RENAL Lab Results  Component Value Date   CREATININE 1.64* 07/07/2015   CREATININE 1.71* 07/06/2015   CREATININE 1.58* 07/05/2015   CREATININE 2.67* 09/26/2014    A:   CKD stage III, neg 2 liters P:   Monitor chem in am  Lasix kvo  GASTROINTESTINAL A:   Diet on going P:   Low carb diabetic diet  HEMATOLOGIC A:   At risk for anemia of critical illness and DVT P:  DVT prophylaxis plavix  INFECTIOUS A:   No evidence of infection, PCT normal P:   Monitor off ABX lacticonspecific  ENDOCRINE CBG (last 3)   Recent Labs  07/06/15 1802 07/07/15 0007 07/07/15 0727  GLUCAP 324* 331* 308*   A:   diabetes  Type II , poor control on steroids. P:   Sliding scale insulin change to resistant 12/17 Decrease steroids Lantus to 20 Add hs coverage  NEUROLOGIC A:   has anxious predisposition at baseline P:   RASS goal: 0 Anxiety treatment as needed  Social - He socially isolated  and lives with his 49 year old mom. His mom drives him around. P -Social work consult  To tele  Lavon Paganini. Titus Mould, MD, [place] Pgr: [facility]

## 2015-07-08 ENCOUNTER — Inpatient Hospital Stay (HOSPITAL_COMMUNITY): Payer: Medicaid Other

## 2015-07-08 LAB — BASIC METABOLIC PANEL
Anion gap: 11 (ref 5–15)
BUN: 32 mg/dL — AB (ref 6–20)
CHLORIDE: 93 mmol/L — AB (ref 101–111)
CO2: 29 mmol/L (ref 22–32)
CREATININE: 1.93 mg/dL — AB (ref 0.61–1.24)
Calcium: 9.1 mg/dL (ref 8.9–10.3)
GFR, EST AFRICAN AMERICAN: 42 mL/min — AB (ref >60)
GFR, EST NON AFRICAN AMERICAN: 36 mL/min — AB (ref >60)
Glucose, Bld: 298 mg/dL — ABNORMAL HIGH (ref 65–99)
Potassium: 4.2 mmol/L (ref 3.5–5.1)
SODIUM: 133 mmol/L — AB (ref 135–145)

## 2015-07-08 LAB — GLUCOSE, CAPILLARY
Glucose-Capillary: 283 mg/dL — ABNORMAL HIGH (ref 65–99)
Glucose-Capillary: 368 mg/dL — ABNORMAL HIGH (ref 65–99)
Glucose-Capillary: 285 mg/dL — ABNORMAL HIGH (ref 65–99)
Glucose-Capillary: 242 mg/dL — ABNORMAL HIGH (ref 65–99)

## 2015-07-08 LAB — PHOSPHORUS: PHOSPHORUS: 4.3 mg/dL (ref 2.5–4.6)

## 2015-07-08 LAB — MAGNESIUM: MAGNESIUM: 1.7 mg/dL (ref 1.7–2.4)

## 2015-07-08 MED ORDER — GLIPIZIDE 10 MG PO TABS
10.0000 mg | ORAL_TABLET | Freq: Two times a day (BID) | ORAL | Status: DC
  Administered 2015-07-08 – 2015-07-09 (×2): 10 mg via ORAL
  Filled 2015-07-08 (×3): qty 1

## 2015-07-08 NOTE — Progress Notes (Signed)
PULMONARY / CRITICAL CARE MEDICINE   Name: [name] MRN: [id number] DOB: [birth year]    ADMISSION DATE: 07/05/15 CONSULTATION DATE:  07/05/15  CHIEF COMPLAINT:  wprsening dyspnea  Brief Summary:   59 year old male with multiple medical problems with DIP/ILD due to ongoing smoking, OSA but does not use CPAP due to fit/claustrophobia issues, anxiety. Has had high symptom burden of dyspnea at baseline and feeling miserable NOS. 2016 he had significant decline in his forced vital capacity from 4 L at baseline to 2.6 L. He would use 4 L of oxygen to 6 L of oxygen for subjective dyspnea. However September 2016 visit he continued to have these progressive symptoms including severe cough.  Seen in office 12/16, admitted to floor for acute decompensation, diuresed with improvement in symptoms.    SUBJECTIVE:  Net negative 7300, sr cr increased to 1.93 (from 1.58 on admit).  Tolerated CPAP last pm.  Reports he does not have a CPAP at home.  He never went to get machine.  Pt reports decreased swelling and breathing returning to baseline.    VITAL SIGNS: BP 116/69 mmHg  Pulse 80  Temp(Src) 98.3 F (36.8 C) (Oral)  Resp 20  Ht 6' (1.829 m)  Wt 269 lb 6.4 oz (122.2 kg)  BMI 36.53 kg/m2  SpO2 92%  Vital signs documented in the pulmonary office Filed Vitals:   07/07/15 1949 07/07/15 2058 07/08/15 0506 07/08/15 0926  BP:  125/60 116/69   Pulse: 82 89 80   Temp:  98.3 F (36.8 C) 98.3 F (36.8 C)   TempSrc:  Oral Oral   Resp: 20 20    Height:      Weight:   269 lb 6.4 oz (122.2 kg)   SpO2: 93% 92% 93% 92%    VENTILATOR SETTINGS:    INTAKE / OUTPUT: I/O last 3 completed shifts: In: 18 [P.O.:720; I.V.:50] Out: 4875 [Urine:4875]  PHYSICAL EXAMINATION: General:  Obese male, in acute no distress Neuro:  Alert and oriented 3.  HEENT: difficult to appreciate jvd, mm pink/moist Cardiovascular:  s1s2 RRR distant Lungs:  Even/non-labored, lungs bilaterally with faint wheeze, upper  airway noise with act of sitting up in bed Abdomen:  Obese soft non-tender, non-distended Musculoskeletal:  No acute deformities Skin:  Warm/dry, no rashes or lesions, no edema  LABS:  BMET  Recent Labs Lab 07/06/15 0533 07/07/15 0406 07/08/15 0448  NA 135 132* 133*  K 4.7 5.1 4.2  CL 96* 95* 93*  CO2 27 28 29   BUN 16 23* 32*  CREATININE 1.71* 1.64* 1.93*  GLUCOSE 313* 348* 298*    Electrolytes  Recent Labs Lab 07/06/15 0533 07/07/15 0406 07/08/15 0448  CALCIUM 9.5 9.3 9.1  MG  --   --  1.7  PHOS  --   --  4.3    CBC  Recent Labs Lab 07/05/15 1115 07/06/15 0533 07/07/15 0406  WBC 6.7 7.9 10.2  HGB 12.4* 13.4 13.3  HCT 37.7* 40.8 40.9  PLT 156 159 168    Sepsis Markers  Recent Labs Lab 07/05/15 1115 07/06/15 1010 07/07/15 0406  LATICACIDVEN  --  3.7*  --   PROCALCITON 0.19  --  0.15    ABG  Recent Labs Lab 07/06/15 1140  PHART 7.431  PCO2ART 38.5  PO2ART 52.1*    Cardiac Enzymes  Recent Labs Lab 07/05/15 1115 07/06/15 1143  TROPONINI <0.03 <0.03    Glucose  Recent Labs Lab 07/07/15 0727 07/07/15 1204 07/07/15 1627 07/07/15  2054 07/08/15 0720 07/08/15 1151  GLUCAP 308* 327* 381* 313* 283* 368*    Imaging Dg Chest Port 1 View  07/08/2015  CLINICAL DATA: Interstitial lung disease. EXAM: PORTABLE CHEST 1 VIEW COMPARISON:  07/06/2015 . FINDINGS: Mediastinum hilar structures normal. Cardiomegaly. Normal pulmonary vascularity . Interim clearing of pulmonary interstitial edema . No focal infiltrate. Low lung volumes with mild left base subsegmental atelectasis. No pleural effusion or pneumothorax. IMPRESSION: 1. Stable cardiomegaly. Normal pulmonary vascularity . Interim clearing of pulmonary interstitial edema . 2. Low lung volumes with mild left base subsegmental atelectasis. Electronically Signed   By: Marcello Moores  Register   On: 07/08/2015 07:13      ASSESSMENT / PLAN:  PULMONARY A: #Baseline - DIP with Langerhans' cells  histiocytosis interstitial lung disease with ongoing smoking - Subjective use of 4-6 liters of oxygen for class III-IV subjective dyspnea - unable to control - Possible baseline COPD - Moderate OSA - does not use CPAP (never went to get machine).    #current - Acute on chronic hypoxemic respiratory failure with possible element of hypercapnia; favor diastolic heart failure associated with recent weight gain secondary to insulin injection and prednisone  P:   CPAP QHS & PRN  Will need outpatient OSA follow up - previously seen by Dr. Halford Chessman on 08/03/2014 and recommended for in lab titration study but do not think he followed up with study based on office note review Continue Bronchodilators Lasix as below O2 6 liters, baseline 6L Negative balance as renal function / BP permit  Control afterload  CARDIOVASCULAR A:  Acute on Chronic Diastolic CHF - explaining acute on chronic hypoxemic respiratory failure, ECHO with grade 1 diastolic dysfunction Chest pain - resolved, cardiac enzymes normal Hypertension  P:  Reduce lasix to 40 mg QD, 12/19 (rising cr) Continue home regimen:  Lipitor, plavix, cardizem, imdur, diovan Consider sys better control, may need to max dilt or add hydral depending on HR Continue telemetry monitoring   RENAL A:   CKD stage III P:   Trend BMP / UOP  Lasix as above Replace electrolytes as indictated   GASTROINTESTINAL A:   Morbid Obesity  P:   Low carb diabetic diet  HEMATOLOGIC A:   At risk for anemia of critical illness and DVT P:  DVT prophylaxis, heparin sq Plavix  INFECTIOUS A:   No evidence of infection, PCT normal P:   Monitor off ABX  ENDOCRINE A:   Diabetes Type II , poor control on steroids. P:   Sliding scale insulin change to resistant 12/17 Lantus to 20 HS coverage Resume glipizide 12/19  NEUROLOGIC A:   Anxiety - anxious predisposition at baseline P:   Anxiety treatment as needed   Noe Gens, NP-C [place]  [facility] Pgr: [phone] or if no answer [phone] 07/08/2015, 1:01 PM

## 2015-07-08 NOTE — Care Management Note (Signed)
Case Management Note  Patient Details  Name: KENSON MOLA MRN: [id number] Date of Birth: [birth year]  Subjective/Objective: 59 y/o m admitted w/Acute on chronic respiratory failure. Has home 02-[facility]-has travel tank, uses cane, rw. From home. PT cons.                   Action/Plan:d/c plan home.   Expected Discharge Date:   (unknown)               Expected Discharge Plan:  [place]  In-House Referral:     Discharge planning Services  CM Consult  Post Acute Care Choice:    Choice offered to:     DME Arranged:    DME Agency:     HH Arranged:    HH Agency:     Status of Service:  In process, will continue to follow  Medicare Important Message Given:    Date Medicare IM Given:    Medicare IM give by:    Date Additional Medicare IM Given:    Additional Medicare Important Message give by:     If discussed at [place] of Stay Meetings, dates discussed:    Additional Comments:  Dessa Phi, RN 07/08/2015, 1:55 PM

## 2015-07-09 ENCOUNTER — Other Ambulatory Visit: Payer: Self-pay | Admitting: Acute Care

## 2015-07-09 ENCOUNTER — Inpatient Hospital Stay (HOSPITAL_COMMUNITY): Payer: Medicaid Other

## 2015-07-09 DIAGNOSIS — G4733 Obstructive sleep apnea (adult) (pediatric): Secondary | ICD-10-CM

## 2015-07-09 LAB — BASIC METABOLIC PANEL
Anion gap: 10 (ref 5–15)
BUN: 27 mg/dL — ABNORMAL HIGH (ref 6–20)
CALCIUM: 9.3 mg/dL (ref 8.9–10.3)
CHLORIDE: 96 mmol/L — AB (ref 101–111)
CO2: 30 mmol/L (ref 22–32)
CREATININE: 1.71 mg/dL — AB (ref 0.61–1.24)
GFR calc Af Amer: 48 mL/min — ABNORMAL LOW (ref >60)
GFR calc non Af Amer: 42 mL/min — ABNORMAL LOW (ref >60)
GLUCOSE: 235 mg/dL — AB (ref 65–99)
Potassium: 4.3 mmol/L (ref 3.5–5.1)
Sodium: 136 mmol/L (ref 135–145)

## 2015-07-09 LAB — CBC
HCT: 38.8 % — ABNORMAL LOW (ref 39.0–52.0)
HEMOGLOBIN: 12.7 g/dL — AB (ref 13.0–17.0)
MCH: 28.1 pg (ref 26.0–34.0)
MCHC: 32.7 g/dL (ref 30.0–36.0)
MCV: 85.8 fL (ref 78.0–100.0)
PLATELETS: 151 10*3/uL (ref 150–400)
RBC: 4.52 MIL/uL (ref 4.22–5.81)
RDW: 14.2 % (ref 11.5–15.5)
WBC: 8 10*3/uL (ref 4.0–10.5)

## 2015-07-09 LAB — GLUCOSE, CAPILLARY
GLUCOSE-CAPILLARY: 286 mg/dL — AB (ref 65–99)
GLUCOSE-CAPILLARY: 272 mg/dL — AB (ref 65–99)

## 2015-07-09 LAB — PROCALCITONIN: Procalcitonin: 0.15 ng/mL

## 2015-07-09 MED ORDER — INSULIN GLARGINE 100 UNIT/ML ~~LOC~~ SOLN: 25.0000 [IU] | Freq: Every day | SUBCUTANEOUS | Status: DC

## 2015-07-09 NOTE — Discharge Summary (Signed)
Physician Discharge Summary       Patient ID: KI AKERMAN MRN: [id number] DOB/AGE: [birth year] 59 y.o.  Admit date: 07/05/2015 Discharge date: 07/09/2015  Discharge Diagnoses:   DIP with Langerhans' cells histiocytosis interstitial lung disease with ongoing smoking class III-IV subjective dyspnea Possible baseline COPD Moderate OSA - does not use CPAP (never went to get machine).  Acute on chronic hypoxemic respiratory failure with possible element of hypercapnia Acute on Chronic Diastolic CHF Chest pain Hypertension CKD stage III Morbid Obesity  Diabetes Type II  Anxiety   Detailed Hospital Course:   59 year old male with multiple medical problems with DIP/ILD due to ongoing smoking, OSA but does not use CPAP (simply hadn't gone in for titration study). Has had high symptom burden of dyspnea at baseline and feeling miserable NOS. 2016 he had significant decline in his forced vital capacity from 4 L at baseline to 2.6 L. He would use 4 L of oxygen to 6 L of oxygen for subjective dyspnea. However September 2016 visit he continued to have these progressive symptoms including severe cough. Seen in office 12/16, admitted to floor for acute decompensation, diuresed with improvement in symptoms. Presented to the office on 12/16 reports that he was diagnosed with diabetes 3 weeks prior and was started on insulin. Since then he's had 20s-30 pound weight gain. This is associated with worsening dyspnea and cough both are of severe intensity. There is also worsening pedal edema. Resting oxygen saturation on room air was 88% and he started to feel dyspneic. This is definitely updated objectively worse compared to September 2016 there is no fever or sputum production or hemoptysis. In the office he was noted to be sleeping easily and fatigue. He denies being on opioids. He was admitted for IV diuresis. He was admitted initially to the medical ward. His working diagnosis was Acute on chronic  respiratory failure in the setting of decompensated diastolic heart failure w/ pulmonary edema. We felt that this was likely exacerbated by fluid retention as a result of recent addition of insulin as well as systemic steroids. He was treated w/ IV diuresis, and continued on his home regimen. He was slow to improve and we actually moved him briefly to the SDU setting as his symptom burden had been significant. At that point we felt him stable enough to add back his after load reduction meds and he seemed to greatly improve after this. We also started him on auto-titration CPAP. He was moved out of the SDU setting on 12/19, and on 12/20 he felt he had essentially returned to baseline breathing status. He is now ready for d/c with the plan of care as outlined below per his active problem list.   Discharge Plan by active problems  Acute on chronic hypoxemic respiratory failure with possible element of hypercapnia in setting of decompensated diastolic dysfunction; superimposed on DIP with Langerhans' cells histiocytosis interstitial lung disease with ongoing smoking and Possible baseline COPD Plan DC to home on Oxygen 6 liters.  Home on his regular BD/ICS regimen  Control afterload: Cont home cardiac regimen Daily weights: instructed to contact us if wt gain > 3 lbs in 1 day or 5 lbs in 2 days  F/u w/ ACNP Anselm Lis Frid 30th at 9am   Moderate OSA - does not use CPAP (never went to get machine).  Plan:  Given progression of disease and known OSA have placed order to set him up with auto-titration CPAP Will need f/u w/ Halford Chessman after  his titration study (have placed order for this)  Hypertension Plan:  Reduce lasix to 40 mg QD, 12/19 (rising cr) Continue home regimen: Lipitor, plavix, cardizem, imdur, diovan  CKD stage III Plan:  Will need f/u chemistry   Morbid Obesity  Plan:  Low carb diabetic diet   Diabetes Type II , poor control on steroids. Plan:  Resume home regimen  Resume  glipizide 12/19  Anxiety - anxious predisposition at baseline Plan  Anxiety treatment as needed Have streamed lined this as he was on two SSRIs   Significant Hospital tests/ studies  ECHO 12/16:  Left ventricle: The cavity size was normal. Wall thickness wasnormal. Systolic function was normal. The estimated ejectionfraction was in the range of 60% to 65%. Doppler parameters are consistent with abnormal left ventricular relaxation (grade 1diastolic dysfunction).  Discharge Exam: BP 142/72 mmHg  Pulse 71  Temp(Src) 97.7 F (36.5 C) (Oral)  Resp 16  Ht 6' (1.829 m)  Wt 122.244 kg (269 lb 8 oz)  BMI 36.54 kg/m2  SpO2 97%  General: Obese male, in no distress Neuro: Alert and oriented 3.  HEENT: difficult to appreciate jvd, mm pink/moist Cardiovascular: s1s2 RRR distant Lungs: Even/non-labored, lungs bilaterally with faint wheeze, crackles in bases Abdomen: Obese soft non-tender, non-distended Musculoskeletal: No acute deformities Skin: Warm/dry, no rashes or lesions, no edema  Labs at discharge Lab Results  Component Value Date   CREATININE 1.71* 07/09/2015   BUN 27* 07/09/2015   NA 136 07/09/2015   K 4.3 07/09/2015   CL 96* 07/09/2015   CO2 30 07/09/2015   Lab Results  Component Value Date   WBC 8.0 07/09/2015   HGB 12.7* 07/09/2015   HCT 38.8* 07/09/2015   MCV 85.8 07/09/2015   PLT 151 07/09/2015   Lab Results  Component Value Date   ALT 19 10/13/2014   AST 21 10/13/2014   ALKPHOS 52 10/13/2014   BILITOT 0.3 10/13/2014   Lab Results  Component Value Date   INR 1.05 10/05/2014   INR 1.03 05/17/2014   INR 1.04 02/22/2014   CBG (last 3)   Recent Labs  07/08/15 2132 07/09/15 0734 07/09/15 1133  GLUCAP 242* 286* 272*    Current radiology studies Dg Chest 2 View  07/09/2015  CLINICAL DATA:  Shortness of breath today.  COPD. EXAM: CHEST  2 VIEW COMPARISON:  07/08/2015 FINDINGS: Lungs are somewhat hypoinflated without consolidation or  effusion. Surgical suture line over the left lung. Cardiomediastinal silhouette is within normal. There is calcified plaque over the aortic arch. There are degenerative changes of the spine. IMPRESSION: Mild hypoinflation without acute cardiopulmonary disease. Electronically Signed   By: Marin Olp M.D.   On: 07/09/2015 10:19   Dg Chest Port 1 View  07/08/2015  CLINICAL DATA: Interstitial lung disease. EXAM: PORTABLE CHEST 1 VIEW COMPARISON:  07/06/2015 . FINDINGS: Mediastinum hilar structures normal. Cardiomegaly. Normal pulmonary vascularity . Interim clearing of pulmonary interstitial edema . No focal infiltrate. Low lung volumes with mild left base subsegmental atelectasis. No pleural effusion or pneumothorax. IMPRESSION: 1. Stable cardiomegaly. Normal pulmonary vascularity . Interim clearing of pulmonary interstitial edema . 2. Low lung volumes with mild left base subsegmental atelectasis. Electronically Signed   By: Marcello Moores  Register   On: 07/08/2015 07:13    Disposition:  01-Home or Self Care      Discharge Instructions    (HEART FAILURE PATIENTS) Call MD:  Anytime you have any of the following symptoms: 1) 3 pound weight gain in 24 hours  or 5 pounds in 1 week 2) shortness of breath, with or without a dry hacking cough 3) swelling in the hands, feet or stomach 4) if you have to sleep on extra pillows at night in order to breathe.    Complete by:  As directed      Diet - low sodium heart healthy    Complete by:  As directed      For home use only DME continuous positive airway pressure (CPAP)    Complete by:  As directed   Patient has OSA or probable OSA:  Yes  Is the patient currently using CPAP in the home:  No  Date of face to face encounter:  08/04/2014  Settings:  Autotitration  CPAP supplies needed:  Mask, headgear, cushions, filters, heated tubing and water chamber     For home use only DME oxygen    Complete by:  As directed   Mode or (Route):  Nasal cannula  Liters per  Minute:  6  Frequency:  Continuous (stationary and portable oxygen unit needed)  Oxygen delivery system:  Gas     Increase activity slowly    Complete by:  As directed             Medication List    STOP taking these medications        FLUoxetine 10 MG capsule  Commonly known as:  PROZAC      TAKE these medications        albuterol 108 (90 BASE) MCG/ACT inhaler  Commonly known as:  PROVENTIL HFA;VENTOLIN HFA  Inhale 2 puffs into the lungs every 6 (six) hours as needed for wheezing or shortness of breath.     albuterol (2.5 MG/3ML) 0.083% nebulizer solution  Commonly known as:  PROVENTIL  Take 3 mLs (2.5 mg total) by nebulization every 3 (three) hours as needed for wheezing or shortness of breath.     alfuzosin 10 MG 24 hr tablet  Commonly known as:  UROXATRAL  Take 10 mg by mouth daily with breakfast.     ALPRAZolam 0.25 MG tablet  Commonly known as:  XANAX  Take 0.25 mg by mouth 2 (two) times daily as needed for anxiety.     atorvastatin 20 MG tablet  Commonly known as:  LIPITOR  Take 20 mg by mouth daily.     budesonide-formoterol 160-4.5 MCG/ACT inhaler  Commonly known as:  SYMBICORT  Inhale 2 puffs into the lungs 2 (two) times daily.     cholecalciferol 1000 UNITS tablet  Commonly known as:  VITAMIN D  Take 2,000 Units by mouth daily.     citalopram 20 MG tablet  Commonly known as:  CELEXA  Take 20 mg by mouth 2 (two) times daily.     clopidogrel 75 MG tablet  Commonly known as:  PLAVIX  Take 1 tablet (75 mg total) by mouth daily.     diltiazem 240 MG 24 hr capsule  Commonly known as:  CARDIZEM CD  Take 240 mg by mouth daily.     FISH OIL + D3 PO  Take 1 capsule by mouth 4 (four) times daily.     furosemide 40 MG tablet  Commonly known as:  LASIX  Take 40 mg by mouth daily.     gabapentin 300 MG capsule  Commonly known as:  NEURONTIN  Take 300-600 mg by mouth 2 (two) times daily. 1 cap in morning and 2 caps at night.     glipiZIDE 10 MG  tablet  Commonly known as:  GLUCOTROL  Take 10 mg by mouth 2 (two) times daily.     insulin glargine 100 UNIT/ML injection  Commonly known as:  LANTUS  Inject 0.25 mLs (25 Units total) into the skin daily.     ipratropium-albuterol 0.5-2.5 (3) MG/3ML Soln  Commonly known as:  DUONEB  Take 3 mLs by nebulization every 6 (six) hours as needed (Shortness of breath).     isosorbide mononitrate 60 MG 24 hr tablet  Commonly known as:  IMDUR  Take 1 tablet (60 mg total) by mouth daily.     nitroGLYCERIN 0.4 MG SL tablet  Commonly known as:  NITROSTAT  Place 1 tablet (0.4 mg total) under the tongue every 5 (five) minutes as needed for chest pain.     omeprazole 40 MG capsule  Commonly known as:  PRILOSEC  Take 40 mg by mouth daily.     ranitidine 300 MG tablet  Commonly known as:  ZANTAC  Take 300 mg by mouth at bedtime.     solifenacin 5 MG tablet  Commonly known as:  VESICARE  Take 5 mg by mouth daily.     valsartan 40 MG tablet  Commonly known as:  DIOVAN  Take 20 mg by mouth daily.       Follow-up Information    Follow up with Magdalen Spatz, NP On 07/19/2015.   Specialty:  Pulmonary Disease   Why:  9am    Contact information:   [address] [place] [address] [phone]       Follow up with [name], [name], MD. Schedule an appointment as soon as possible for a visit in 2 weeks.   Specialty:  Family Medicine   Contact information:   [address] [place] [address] [phone]       Discharged Condition: fair  Physician Statement:   The Patient was personally examined, the discharge assessment and plan has been personally reviewed and I agree with ACNP Rochele Lueck's assessment and plan. > 30 minutes of time have been dedicated to discharge assessment, planning and discharge instructions.   Signed: Clementeen Graham 07/09/2015, 11:57 AM

## 2015-07-09 NOTE — Care Management Note (Signed)
Case Management Note  Patient Details  Name: [name] MRN: [id number] Date of Birth: 01/14/1956  Subjective/Objective:     Pt-recc HHPT. Patient has medicaid-HHRN safety eval-covered by insurance) NP Laurey Arrow aware & will order [facility], Education officer, museum.[facility] rep [place] Glad aware of d/c & HHC orders.               Action/Plan:d/c home w/HHC/f/u for auto titration of CPAP.   Expected Discharge Date:   (unknown)               Expected Discharge Plan:  [place]  In-House Referral:     Discharge planning Services  CM Consult  Post Acute Care Choice:    Choice offered to:  Patient  DME Arranged:  Other see comment (CPAP auto titration-[facility]) DME Agency:     HH Arranged:  RN, Social Work [employer] Agency:  [place]  Status of Service:  Completed, signed off  Medicare Important Message Given:    Date Medicare IM Given:    Medicare IM give by:    Date Additional Medicare IM Given:    Additional Medicare Important Message give by:     If discussed at [place] of Stay Meetings, dates discussed:    Additional Comments:  Dessa Phi, RN 07/09/2015, 12:42 PM

## 2015-07-09 NOTE — Evaluation (Signed)
Physical Therapy Evaluation Patient Details Name: [name] MRN: [id number] DOB: 21-Apr-1956 Today's Date: 07/09/2015   History of Present Illness  59 yo male admitted with acute on chronic respiratory failure. Hx of OSA, interstitial lung disease, DM, COPD, cocaine abuse, Hep, arthritis, CVA, noncompliance.   Clinical Impression  On eval, pt was Min guard assist for mobility-walked ~135 feet with RW, 6L O2-sats 97%. Pt tolerated session well. Unsteady at times especially without support of walker. Recommend continued RW use at discharge.     Follow Up Recommendations Supervision - Intermittent; home safety evaluation    Equipment Recommendations  None recommended by PT    Recommendations for Other Services       Precautions / Restrictions Precautions Precautions: Fall Precaution Comments: O2 dep Restrictions Weight Bearing Restrictions: No      Mobility  Bed Mobility               General bed mobility comments: pt up standing at sink in room  Transfers Overall transfer level: Modified independent                  Ambulation/Gait Ambulation/Gait assistance: Min guard Ambulation Distance (Feet): 135 Feet Assistive device: Rolling walker (2 wheeled)       General Gait Details: Took a few steps in room without walker-unsteady with near LOB. Transitioned to RW use-improved stability-Min guard assist. O2 6L-sats 97%  Stairs            Wheelchair Mobility    Modified Rankin (Stroke Patients Only)       Balance Overall balance assessment: Needs assistance         Standing balance support: During functional activity Standing balance-Leahy Scale: Fair                               Pertinent Vitals/Pain Pain Assessment: No/denies pain    Home Living Family/patient expects to be discharged to:: Private residence Living Arrangements: Parent Available Help at Discharge: Family;Available 24 hours/day Type of Home:  House Home Access: Ramped entrance     Home Layout: One level Home Equipment: Shower seat - built in;Bedside commode Additional Comments: mom has RW and equipment    Prior Function Level of Independence: Independent               Hand Dominance        Extremity/Trunk Assessment   Upper Extremity Assessment: Overall WFL for tasks assessed           Lower Extremity Assessment: Generalized weakness      Cervical / Trunk Assessment: Normal  Communication   Communication: No difficulties  Cognition Arousal/Alertness: Awake/alert Behavior During Therapy: WFL for tasks assessed/performed Overall Cognitive Status: Within Functional Limits for tasks assessed                      General Comments      Exercises        Assessment/Plan    PT Assessment All further PT needs can be met in the next venue of care (home safety evaluation)  PT Diagnosis Difficulty walking   PT Problem List    PT Treatment Interventions     PT Goals (Current goals can be found in the Care Plan section) Acute Rehab PT Goals Patient Stated Goal: to get out of here PT Goal Formulation: All assessment and education complete, DC therapy    Frequency  Barriers to discharge        Co-evaluation               End of Session Equipment Utilized During Treatment: Gait belt;Oxygen Activity Tolerance: Patient tolerated treatment well Patient left: in bed;with call bell/phone within reach;with family/visitor present           Time: 1227-1236 PT Time Calculation (min) (ACUTE ONLY): 9 min   Charges:   PT Evaluation $Initial PT Evaluation Tier I: 1 Procedure     PT G Codes:        Weston Anna, MPT Pager: [phone]

## 2015-07-09 NOTE — Care Management Note (Signed)
Case Management Note  Patient Details  Name: [name] MRN: [id number] Date of Birth: 02-10-1956  Subjective/Objective: Referral for home auto titration cpap.  [facility] dme rep Pura Spice will let me know the outcome of her eval, & recc for auto titration.                   Action/Plan:d/c plan home   Expected Discharge Date:   (unknown)               Expected Discharge Plan:  [place]  In-House Referral:     Discharge planning Services  CM Consult  Post Acute Care Choice:    Choice offered to:     DME Arranged:    DME Agency:     HH Arranged:    HH Agency:     Status of Service:  In process, will continue to follow  Medicare Important Message Given:    Date Medicare IM Given:    Medicare IM give by:    Date Additional Medicare IM Given:    Additional Medicare Important Message give by:     If discussed at [place] of Stay Meetings, dates discussed:    Additional Comments:  Dessa Phi, RN 07/09/2015, 11:31 AM

## 2015-07-09 NOTE — Care Management Note (Signed)
Case Management Note  Patient Details  Name: [name] MRN: [id number] Date of Birth: [birth year]  Subjective/Objective:  [facility] dme rep-Lecretia has sent in all info needed to set up auto titration CPAP.  No further d/c needs.                  Action/Plan:d/c home.   Expected Discharge Date:   (unknown)               Expected Discharge Plan:  Home/Self Care  In-House Referral:     Discharge planning Services  CM Consult  Post Acute Care Choice:    Choice offered to:     DME Arranged:  Other see comment (CPAP auto titration-[facility]) DME Agency:     HH Arranged:    HH Agency:     Status of Service:  Completed, signed off  Medicare Important Message Given:    Date Medicare IM Given:    Medicare IM give by:    Date Additional Medicare IM Given:    Additional Medicare Important Message give by:     If discussed at [place] of Stay Meetings, dates discussed:    Additional Comments:  Dessa Phi, RN 07/09/2015, 12:19 PM

## 2015-07-16 ENCOUNTER — Telehealth: Payer: Self-pay | Admitting: Internal Medicine

## 2015-07-16 NOTE — Telephone Encounter (Signed)
Recommend he alternate lasix 40 mg with 80 mg every other day.

## 2015-07-16 NOTE — Telephone Encounter (Signed)
Patient last seen on 07/05/15 with MR and was Admitted   Called and spoke with patient. He states he was discharged from the hospital on 07/14/15 and was told to call our office if he had fluid gain. Patient is currently taking lasix 40mg  daily and states he has gained 9lbs since 07/14/15. He c/o swelling in both feet and requests MR's recs. I explained to him that the MR was out of the office and would send the message to our Doc of the Day. He voiced understanding and had no further questions.   Discharge summary printed with phone note CY please advise

## 2015-07-16 NOTE — Telephone Encounter (Signed)
Called and spoke with patient. Informed him of CY's recs. Patient voiced understanding and had no further questions.

## 2015-07-19 ENCOUNTER — Other Ambulatory Visit (INDEPENDENT_AMBULATORY_CARE_PROVIDER_SITE_OTHER): Payer: Medicaid Other

## 2015-07-19 ENCOUNTER — Other Ambulatory Visit: Payer: Self-pay | Admitting: Acute Care

## 2015-07-19 ENCOUNTER — Ambulatory Visit (INDEPENDENT_AMBULATORY_CARE_PROVIDER_SITE_OTHER)
Admission: RE | Admit: 2015-07-19 | Discharge: 2015-07-19 | Disposition: A | Discharge status: Home / Self Care | Payer: Medicaid Other | Source: Ambulatory Visit | Attending: Acute Care | Admitting: Acute Care

## 2015-07-19 ENCOUNTER — Telehealth: Payer: Self-pay | Admitting: Acute Care

## 2015-07-19 ENCOUNTER — Ambulatory Visit (INDEPENDENT_AMBULATORY_CARE_PROVIDER_SITE_OTHER): Payer: Medicaid Other | Admitting: Acute Care

## 2015-07-19 ENCOUNTER — Encounter: Payer: Self-pay | Admitting: Acute Care

## 2015-07-19 VITALS — BP 104/62 | HR 94 | Ht 72.0 in | Wt 276.0 lb

## 2015-07-19 DIAGNOSIS — J810 Acute pulmonary edema: Secondary | ICD-10-CM

## 2015-07-19 DIAGNOSIS — Z720 Tobacco use: Secondary | ICD-10-CM

## 2015-07-19 DIAGNOSIS — J441 Chronic obstructive pulmonary disease with (acute) exacerbation: Secondary | ICD-10-CM

## 2015-07-19 DIAGNOSIS — J9611 Chronic respiratory failure with hypoxia: Secondary | ICD-10-CM

## 2015-07-19 DIAGNOSIS — J849 Interstitial pulmonary disease, unspecified: Secondary | ICD-10-CM

## 2015-07-19 DIAGNOSIS — I10 Essential (primary) hypertension: Secondary | ICD-10-CM

## 2015-07-19 DIAGNOSIS — N183 Chronic kidney disease, stage 3 unspecified: Secondary | ICD-10-CM

## 2015-07-19 DIAGNOSIS — Z7409 Other reduced mobility: Secondary | ICD-10-CM

## 2015-07-19 DIAGNOSIS — J438 Other emphysema: Secondary | ICD-10-CM

## 2015-07-19 LAB — BASIC METABOLIC PANEL
BUN: 21 mg/dL (ref 6–23)
CHLORIDE: 91 meq/L — AB (ref 96–112)
CO2: 27 mEq/L (ref 19–32)
Calcium: 9.5 mg/dL (ref 8.4–10.5)
Creatinine, Ser: 1.86 mg/dL — ABNORMAL HIGH (ref 0.40–1.50)
GFR: 39.58 mL/min — ABNORMAL LOW (ref >60.00)
Glucose, Bld: 434 mg/dL — ABNORMAL HIGH (ref 70–99)
POTASSIUM: 4.1 meq/L (ref 3.5–5.1)
SODIUM: 130 meq/L — AB (ref 135–145)

## 2015-07-19 NOTE — Assessment & Plan Note (Signed)
Blood Glucose levels remain uncontrolled. Currently on Glucotrol 10 mg po BID and Lantus insulin 25 units daily.  Plan: Home health has contacted PCP about adding sliding scale coverage and increasing Lantus insulin dose. Reviewed low card diet for diabetic patient.

## 2015-07-19 NOTE — Telephone Encounter (Signed)
I called [name] with his lab and CXR results from today's appointment.His labs show stable renal function.We will  continue the lasix 40 mg QD alternating with 80 mg QD :1 dose a day as he continues to have weight gain. He will return to the office for follow up in 2 weeks. He will see his cardiologist 07/26/15, and I have requested that they do a BMET to ensure renal function remains stable. I have placed the order and messaged the MD with the request.  I told [name] that his CXR show a slight increase in heart size, and that we will follow up with another CXR when we see him in the office in 2 weeks. He knows to continue with his daily weights and call with any increase in weight as we had discussed and as is documented in his after visit summary. He verbalized understanding of the above. I reminded him to call for a earlier appointment in the event he needs [place] sooner.

## 2015-07-19 NOTE — Assessment & Plan Note (Signed)
Current creatinine remains stable on Lasix regimen, but requires close monitoring.  Plan:  Follow up BMET at Cards appointment 07/26/15.

## 2015-07-19 NOTE — Assessment & Plan Note (Addendum)
Blood Pressure well controlled on current Regimen. Plan: Continue Lipitor, plavix,cardizem,imdur,diovan as prescribed.

## 2015-07-19 NOTE — Assessment & Plan Note (Signed)
Pt. Feels he is at Baseline breathing status post hospitalization.  Plan: Stop Smoking Smoking cessation counseling done. Continue your regular BD and ICS Oxygen use as needed.

## 2015-07-19 NOTE — Assessment & Plan Note (Signed)
Pt. Continues to smoke on home O2.   Plan: Smoking cessation counseling done 07/19/15. I told [name] he has got to stop smoking. Hazards of smoking while on O2 reviewed. We discussed the impact continuing to smoke has on his ability to recover, his long term health and his quality of life. Be stronger than your excuses card given to patient.

## 2015-07-19 NOTE — Progress Notes (Signed)
Subjective:    Patient ID: [name], male    DOB: 1955/08/22, 59 y.o.   MRN: [id number]  HPI   07/19/15: 59 year old male with multiple medical problems with DIP/ILD due to ongoing smoking, OSA , high symptom burden of dyspnea at baseline.  Here for hospital F/U OV for admission from 07/05/15- 07/09/15 for acute on chronic respiratory failure in setting of decompensated heart failure with pulmonary edema.He was admitted for IV diuresis.   Significant tests/events:  ECHO 12/16: Left ventricle: The cavity size was normal. Wall thickness wasnormal. Systolic function was normal. The estimated ejectionfraction was in the range of 60% to 65%. Doppler parameters are consistent with abnormal left ventricular relaxation (grade 1diastolic dysfunction).  BMP Latest Ref Rng 07/19/2015 07/09/2015 07/08/2015  Glucose 70 - 99 mg/dL 434(H) 235(H) 298(H)  BUN 6 - 23 mg/dL 21 27(H) 32(H)  Creatinine 0.40 - 1.50 mg/dL 1.86(H) 1.71(H) 1.93(H)  Sodium 135 - 145 mEq/L 130(L) 136 133(L)  Potassium 3.5 - 5.1 mEq/L 4.1 4.3 4.2  Chloride 96 - 112 mEq/L 91(L) 96(L) 93(L)  CO2 19 - 32 mEq/L 27 30 29   Calcium 8.4 - 10.5 mg/dL 9.5 9.3 9.1   07/19/15 Follow up : Acute on chronic Respiratory Failure Pt. Returns for follow up after hospital discharge. [name] feels he is improving.He remains on his home oxygen at 6L N/C day and night, but does take it off at times while at home. He continues with his regular BD and ICS regimen He continues to smoke. We discussed that it is absolutely necessary to quit smoking.I have given him the " Be stronger than your excuses" card.We discussed the dangers of smoking while on oxygen.He feels he is at his baseline breathing status, but remains weak and often times without energy. He has requested a wheel chair so he can go grocery shopping and get out of the house. He had called the office for weight gain, as he had been instructed at discharge. Dr. Annamaria Boots had increased his  Lasix to 40 mg alternating with 80 mg to see if there was improvement.BMET is stable today, but needs weekly monitoring while on Lasix as pt. Has CKD stage III. He is using his CPAP every night for his OSA and is sleeping better and feels more rested. We have scheduled him for a titration study 07/25/15.   Allergies  Allergen Reactions  . Oxycodone Itching and Nausea Only      Past Medical History  Diagnosis Date  . Essential hypertension, benign   . History of stroke     Right MCA distribution, residual left-sided weakness  . Noncompliance   . COPD (chronic obstructive pulmonary disease) ([place])   . Hyperlipidemia   . History of pneumonia   . Type 2 diabetes mellitus ([place]) 2011  . GERD (gastroesophageal reflux disease)   . Cocaine abuse   . Cholecystitis, acute 12/20/2013    Lap chole 6/5  . Coronary atherosclerosis of native coronary artery     a. 03/09/2013 Cath/PCI: LM nl, LAD: 50p, 64m (2.5x16 promus DES), LCX nl, OM1 min irregs, LPL/LPDA diff dzs, RCA nondom, mod diff dzs, EF 55%.  . Depression   . Anxiety   . History of nephrolithiasis   . Arthritis   . Hepatitis Late 1970s  . Sleep apnea     On CPAP, 4L O2 no cpap at home yet  . Renal cell carcinoma [facility])     Status post radical right nephrectomy August 2015  .  Headache    Current outpatient prescriptions:  .  albuterol (PROVENTIL HFA;VENTOLIN HFA) 108 (90 BASE) MCG/ACT inhaler, Inhale 2 puffs into the lungs every 6 (six) hours as needed for wheezing or shortness of breath., Disp: , Rfl:  .  albuterol (PROVENTIL) (2.5 MG/3ML) 0.083% nebulizer solution, Take 3 mLs (2.5 mg total) by nebulization every 3 (three) hours as needed for wheezing or shortness of breath., Disp: 75 mL, Rfl: 12 .  alfuzosin (UROXATRAL) 10 MG 24 hr tablet, Take 10 mg by mouth daily with breakfast., Disp: , Rfl:  .  ALPRAZolam (XANAX) 0.25 MG tablet, Take 0.25 mg by mouth 2 (two) times daily as needed for anxiety., Disp: , Rfl:  .  atorvastatin (LIPITOR) 20  MG tablet, Take 20 mg by mouth daily., Disp: , Rfl:  .  budesonide-formoterol (SYMBICORT) 160-4.5 MCG/ACT inhaler, Inhale 2 puffs into the lungs 2 (two) times daily., Disp: 1 Inhaler, Rfl: 3 .  cholecalciferol (VITAMIN D) 1000 UNITS tablet, Take 2,000 Units by mouth daily., Disp: , Rfl:  .  citalopram (CELEXA) 20 MG tablet, Take 20 mg by mouth 2 (two) times daily. , Disp: , Rfl:  .  clopidogrel (PLAVIX) 75 MG tablet, Take 1 tablet (75 mg total) by mouth daily., Disp: 30 tablet, Rfl: 11 .  diltiazem (CARDIZEM CD) 240 MG 24 hr capsule, Take 240 mg by mouth daily., Disp: , Rfl:  .  Fish Oil-Cholecalciferol (FISH OIL + D3 PO), Take 1 capsule by mouth 4 (four) times daily. , Disp: , Rfl:  .  furosemide (LASIX) 40 MG tablet, Take 40 mg by mouth daily., Disp: , Rfl:  .  gabapentin (NEURONTIN) 300 MG capsule, Take 300-600 mg by mouth 2 (two) times daily. 1 cap in morning and 2 caps at night., Disp: , Rfl:  .  glipiZIDE (GLUCOTROL) 10 MG tablet, Take 10 mg by mouth 2 (two) times daily., Disp: , Rfl:  .  insulin glargine (LANTUS) 100 UNIT/ML injection, Inject 0.25 mLs (25 Units total) into the skin daily., Disp: 10 mL, Rfl: 11 .  ipratropium-albuterol (DUONEB) 0.5-2.5 (3) MG/3ML SOLN, Take 3 mLs by nebulization every 6 (six) hours as needed (Shortness of breath)., Disp: , Rfl:  .  isosorbide mononitrate (IMDUR) 60 MG 24 hr tablet, Take 1 tablet (60 mg total) by mouth daily., Disp: 90 tablet, Rfl: 3 .  nitroGLYCERIN (NITROSTAT) 0.4 MG SL tablet, Place 1 tablet (0.4 mg total) under the tongue every 5 (five) minutes as needed for chest pain., Disp: 25 tablet, Rfl: 3 .  omeprazole (PRILOSEC) 40 MG capsule, Take 40 mg by mouth daily., Disp: , Rfl:  .  ranitidine (ZANTAC) 300 MG tablet, Take 300 mg by mouth at bedtime. , Disp: , Rfl:  .  solifenacin (VESICARE) 5 MG tablet, Take 5 mg by mouth daily., Disp: , Rfl:  .  valsartan (DIOVAN) 40 MG tablet, Take 20 mg by mouth daily. , Disp: , Rfl:     Review of  Systems  Constitutional:   No  weight loss, + weight gain,  No night sweats,  Fevers, chills, fatigue, or  lassitude.  HEENT:   No headaches,  Difficulty swallowing,  Tooth/dental problems, or  Sore throat,                No sneezing, itching, ear ache, nasal congestion, post nasal drip,   CV:  No chest pain,  Orthopnea, PND, swelling in lower extremities, anasarca, dizziness, palpitations, syncope.   GI  No heartburn, indigestion, abdominal  pain, nausea, vomiting, diarrhea, change in bowel habits, loss of appetite, bloody stools.   Resp: On his home O2 at 6L N/C No shortness of breath with exertion or at rest.  No excess mucus, no productive cough,  Positive non-productive cough,  No coughing up of blood.  No change in color of mucus.  No wheezing.  No chest wall deformity  Skin: no rash or lesions.  GU: no dysuria, change in color of urine, no urgency or frequency.  No flank pain, no hematuria   MS:  No joint pain or swelling.  No decreased range of motion.  No back pain.  Psych:  No change in mood or affect. No depression or anxiety.  No memory loss.        Objective:   Physical Exam  General: Obese male, in no distress, in wheelchair on home oxygen. Neuro: Alert and oriented 3.  HEENT: difficult to appreciate jvd, mm pink/moist Cardiovascular: s1s2 RRR distant Lungs: Even/non-labored, lungs bilaterally without wheeze, crackles in bases, scattered rhonchi throughout. Abdomen: Obese soft non-tender, non-distended Musculoskeletal: No acute deformities Skin: Warm/dry, no rashes or lesions, 1+ edema to left ankle only.       Assessment & Plan:

## 2015-07-19 NOTE — Assessment & Plan Note (Signed)
Improving acute on chronic respiratory failure post hospitalization.  Plan: Continue home O2 at 6 liters. Continue your regular BD and ICS Continue daily weights, call if >3 lb weight gain in 3 days, or >5lb weight gain in 5 days. Try ice chips instead of water when your mouth is dry to prevent additional water intake. Keep lips moist with vaseline/ chap stick. Continue Lasix , alternating the 40 mg with 80 mg every day. Smoking cessation counseling done. Be stronger than your excuses card given to patient with resources and contact information to assist patient in quitting smoking. Continue using CPAP every night for 4-6 hours to get benefit of therapy.

## 2015-07-19 NOTE — Assessment & Plan Note (Signed)
Patient is sleeping better on CPAP autotitration Using it every night Feels more rested in the morning He is clearly benefiting from the treatment  Plan: Continue using CPAP 4-6 hours each night. Titration study ordered and scheduled for 07/25/15. Will plan a download after a month of use and assessment of the treatment.

## 2015-07-19 NOTE — Assessment & Plan Note (Signed)
Patient suffers from chronic lung disease which impairs their ability to perform daily activities like bathing in the home. A walker will not resolve  issue with performing activities of daily living. A wheelchair will allow patient to safely perform daily activities. Patient can safely propel the wheelchair in the home or has a caregiver who can provide assistance.  Patient has very limited ambulation less than 50 feet.  Plan:  Order wheel chair through [place]. Encourage patient to increase his activity when able.

## 2015-07-19 NOTE — Assessment & Plan Note (Signed)
Improving on Lasix.  Plan:  Continue on Lasix with careful monitoring of renal status. Continue daily weights, call if >3 lb weight gain in 3 days, or >5lb weight gain in 5 days. Try ice chips instead of water when your mouth is dry. Follow up with Cards 07/26/15.

## 2015-07-19 NOTE — Patient Instructions (Addendum)
Continue home O2 at 6 liters Continue your regular BD and ICS Continue daily weights, call if >3 lb weight gain in 3 days, or >5lb weight gain in 5 days. Try ice chips instead of water when your mouth is dry Keep lips moist with vaseline/ chap stick We will schedule you for a titration CPAP study. Continue Lasix , alternating the 40 mg with 80 mg every day. If your renal function is less stable based on the labs we drew today, we will call you and adjust the dose. Continue your hypertension meds, you blood pressure is good today. Continue your low carb, low salt diet. Work with you PCP and [place] on better regulation of your blood sugars, with the hope that adding the sliding scale will help. We will place [facility] order [facility] wheel chair to allow you to get out of the house. Please work on smoking cessation. This is the single most powerful action you can take to improve your respiratory function. We will call you with your chest x-ray results. Return in 2 weeks for follow- up. Please contact office for sooner follow up if symptoms do not improve or worsen or seek emergency care.

## 2015-07-23 ENCOUNTER — Emergency Department (HOSPITAL_COMMUNITY): Payer: Medicaid Other

## 2015-07-23 ENCOUNTER — Inpatient Hospital Stay (HOSPITAL_COMMUNITY)
Admission: EM | Admit: 2015-07-23 | Discharge: 2015-07-26 | DRG: 291 | Disposition: A | Discharge status: Home Health | Payer: Medicaid Other | Attending: Internal Medicine | Admitting: Internal Medicine

## 2015-07-23 ENCOUNTER — Telehealth: Payer: Self-pay | Admitting: *Deleted

## 2015-07-23 ENCOUNTER — Encounter (HOSPITAL_COMMUNITY): Payer: Self-pay | Admitting: Cardiology

## 2015-07-23 DIAGNOSIS — Z01810 Encounter for preprocedural cardiovascular examination: Secondary | ICD-10-CM

## 2015-07-23 DIAGNOSIS — E1165 Type 2 diabetes mellitus with hyperglycemia: Secondary | ICD-10-CM

## 2015-07-23 DIAGNOSIS — M1990 Unspecified osteoarthritis, unspecified site: Secondary | ICD-10-CM | POA: Diagnosis present

## 2015-07-23 DIAGNOSIS — G4733 Obstructive sleep apnea (adult) (pediatric): Secondary | ICD-10-CM | POA: Diagnosis present

## 2015-07-23 DIAGNOSIS — IMO0001 Reserved for inherently not codable concepts without codable children: Secondary | ICD-10-CM

## 2015-07-23 DIAGNOSIS — F40240 Claustrophobia: Secondary | ICD-10-CM | POA: Diagnosis present

## 2015-07-23 DIAGNOSIS — F17210 Nicotine dependence, cigarettes, uncomplicated: Secondary | ICD-10-CM | POA: Diagnosis present

## 2015-07-23 DIAGNOSIS — G9341 Metabolic encephalopathy: Secondary | ICD-10-CM

## 2015-07-23 DIAGNOSIS — R42 Dizziness and giddiness: Secondary | ICD-10-CM

## 2015-07-23 DIAGNOSIS — I2510 Atherosclerotic heart disease of native coronary artery without angina pectoris: Secondary | ICD-10-CM | POA: Diagnosis present

## 2015-07-23 DIAGNOSIS — I951 Orthostatic hypotension: Secondary | ICD-10-CM

## 2015-07-23 DIAGNOSIS — R55 Syncope and collapse: Secondary | ICD-10-CM

## 2015-07-23 DIAGNOSIS — N189 Chronic kidney disease, unspecified: Secondary | ICD-10-CM

## 2015-07-23 DIAGNOSIS — E1122 Type 2 diabetes mellitus with diabetic chronic kidney disease: Secondary | ICD-10-CM | POA: Diagnosis present

## 2015-07-23 DIAGNOSIS — Z9114 Patient's other noncompliance with medication regimen: Secondary | ICD-10-CM

## 2015-07-23 DIAGNOSIS — R054 Cough syncope: Secondary | ICD-10-CM

## 2015-07-23 DIAGNOSIS — F329 Major depressive disorder, single episode, unspecified: Secondary | ICD-10-CM

## 2015-07-23 DIAGNOSIS — Z7409 Other reduced mobility: Secondary | ICD-10-CM

## 2015-07-23 DIAGNOSIS — R0683 Snoring: Secondary | ICD-10-CM

## 2015-07-23 DIAGNOSIS — I130 Hypertensive heart and chronic kidney disease with heart failure and stage 1 through stage 4 chronic kidney disease, or unspecified chronic kidney disease: Principal | ICD-10-CM | POA: Diagnosis present

## 2015-07-23 DIAGNOSIS — E118 Type 2 diabetes mellitus with unspecified complications: Secondary | ICD-10-CM

## 2015-07-23 DIAGNOSIS — Z8673 Personal history of transient ischemic attack (TIA), and cerebral infarction without residual deficits: Secondary | ICD-10-CM

## 2015-07-23 DIAGNOSIS — E785 Hyperlipidemia, unspecified: Secondary | ICD-10-CM

## 2015-07-23 DIAGNOSIS — E669 Obesity, unspecified: Secondary | ICD-10-CM | POA: Diagnosis present

## 2015-07-23 DIAGNOSIS — R5381 Other malaise: Secondary | ICD-10-CM

## 2015-07-23 DIAGNOSIS — Z7902 Long term (current) use of antithrombotics/antiplatelets: Secondary | ICD-10-CM | POA: Diagnosis not present

## 2015-07-23 DIAGNOSIS — E871 Hypo-osmolality and hyponatremia: Secondary | ICD-10-CM | POA: Diagnosis present

## 2015-07-23 DIAGNOSIS — D631 Anemia in chronic kidney disease: Secondary | ICD-10-CM | POA: Diagnosis present

## 2015-07-23 DIAGNOSIS — J9600 Acute respiratory failure, unspecified whether with hypoxia or hypercapnia: Secondary | ICD-10-CM | POA: Diagnosis not present

## 2015-07-23 DIAGNOSIS — Z9119 Patient's noncompliance with other medical treatment and regimen: Secondary | ICD-10-CM | POA: Diagnosis not present

## 2015-07-23 DIAGNOSIS — J849 Interstitial pulmonary disease, unspecified: Secondary | ICD-10-CM

## 2015-07-23 DIAGNOSIS — Z6834 Body mass index (BMI) 34.0-34.9, adult: Secondary | ICD-10-CM

## 2015-07-23 DIAGNOSIS — E872 Acidosis: Secondary | ICD-10-CM | POA: Diagnosis present

## 2015-07-23 DIAGNOSIS — Z91148 Patient's other noncompliance with medication regimen for other reason: Secondary | ICD-10-CM

## 2015-07-23 DIAGNOSIS — M79604 Pain in right leg: Secondary | ICD-10-CM

## 2015-07-23 DIAGNOSIS — R5383 Other fatigue: Secondary | ICD-10-CM

## 2015-07-23 DIAGNOSIS — F418 Other specified anxiety disorders: Secondary | ICD-10-CM | POA: Diagnosis present

## 2015-07-23 DIAGNOSIS — N179 Acute kidney failure, unspecified: Secondary | ICD-10-CM | POA: Diagnosis present

## 2015-07-23 DIAGNOSIS — Z85528 Personal history of other malignant neoplasm of kidney: Secondary | ICD-10-CM | POA: Diagnosis not present

## 2015-07-23 DIAGNOSIS — I5033 Acute on chronic diastolic (congestive) heart failure: Secondary | ICD-10-CM | POA: Diagnosis present

## 2015-07-23 DIAGNOSIS — E8770 Fluid overload, unspecified: Secondary | ICD-10-CM

## 2015-07-23 DIAGNOSIS — J9621 Acute and chronic respiratory failure with hypoxia: Secondary | ICD-10-CM

## 2015-07-23 DIAGNOSIS — N183 Chronic kidney disease, stage 3 unspecified: Secondary | ICD-10-CM

## 2015-07-23 DIAGNOSIS — J9611 Chronic respiratory failure with hypoxia: Secondary | ICD-10-CM | POA: Diagnosis not present

## 2015-07-23 DIAGNOSIS — K219 Gastro-esophageal reflux disease without esophagitis: Secondary | ICD-10-CM | POA: Diagnosis present

## 2015-07-23 DIAGNOSIS — I9589 Other hypotension: Secondary | ICD-10-CM

## 2015-07-23 DIAGNOSIS — J9622 Acute and chronic respiratory failure with hypercapnia: Secondary | ICD-10-CM

## 2015-07-23 DIAGNOSIS — D49519 Neoplasm of unspecified behavior of unspecified kidney: Secondary | ICD-10-CM

## 2015-07-23 DIAGNOSIS — J438 Other emphysema: Secondary | ICD-10-CM

## 2015-07-23 DIAGNOSIS — R0789 Other chest pain: Secondary | ICD-10-CM

## 2015-07-23 DIAGNOSIS — J810 Acute pulmonary edema: Secondary | ICD-10-CM

## 2015-07-23 DIAGNOSIS — J9602 Acute respiratory failure with hypercapnia: Secondary | ICD-10-CM | POA: Diagnosis not present

## 2015-07-23 DIAGNOSIS — Z79899 Other long term (current) drug therapy: Secondary | ICD-10-CM | POA: Diagnosis not present

## 2015-07-23 DIAGNOSIS — F411 Generalized anxiety disorder: Secondary | ICD-10-CM

## 2015-07-23 DIAGNOSIS — C966 Unifocal Langerhans-cell histiocytosis: Secondary | ICD-10-CM | POA: Diagnosis present

## 2015-07-23 DIAGNOSIS — R200 Anesthesia of skin: Secondary | ICD-10-CM

## 2015-07-23 DIAGNOSIS — J84117 Desquamative interstitial pneumonia: Secondary | ICD-10-CM

## 2015-07-23 DIAGNOSIS — M79661 Pain in right lower leg: Secondary | ICD-10-CM

## 2015-07-23 DIAGNOSIS — Z794 Long term (current) use of insulin: Secondary | ICD-10-CM

## 2015-07-23 DIAGNOSIS — J9601 Acute respiratory failure with hypoxia: Secondary | ICD-10-CM | POA: Diagnosis not present

## 2015-07-23 DIAGNOSIS — N2889 Other specified disorders of kidney and ureter: Secondary | ICD-10-CM

## 2015-07-23 DIAGNOSIS — I509 Heart failure, unspecified: Secondary | ICD-10-CM

## 2015-07-23 DIAGNOSIS — Z9981 Dependence on supplemental oxygen: Secondary | ICD-10-CM | POA: Diagnosis not present

## 2015-07-23 DIAGNOSIS — Z720 Tobacco use: Secondary | ICD-10-CM

## 2015-07-23 DIAGNOSIS — N3281 Overactive bladder: Secondary | ICD-10-CM | POA: Diagnosis present

## 2015-07-23 DIAGNOSIS — I10 Essential (primary) hypertension: Secondary | ICD-10-CM

## 2015-07-23 DIAGNOSIS — F17200 Nicotine dependence, unspecified, uncomplicated: Secondary | ICD-10-CM

## 2015-07-23 DIAGNOSIS — J441 Chronic obstructive pulmonary disease with (acute) exacerbation: Secondary | ICD-10-CM

## 2015-07-23 DIAGNOSIS — R292 Abnormal reflex: Secondary | ICD-10-CM

## 2015-07-23 DIAGNOSIS — F1910 Other psychoactive substance abuse, uncomplicated: Secondary | ICD-10-CM

## 2015-07-23 DIAGNOSIS — IMO0002 Reserved for concepts with insufficient information to code with codable children: Secondary | ICD-10-CM

## 2015-07-23 DIAGNOSIS — Z885 Allergy status to narcotic agent status: Secondary | ICD-10-CM | POA: Diagnosis not present

## 2015-07-23 DIAGNOSIS — I779 Disorder of arteries and arterioles, unspecified: Secondary | ICD-10-CM

## 2015-07-23 DIAGNOSIS — F32A Depression, unspecified: Secondary | ICD-10-CM

## 2015-07-23 DIAGNOSIS — R05 Cough: Secondary | ICD-10-CM

## 2015-07-23 DIAGNOSIS — F1410 Cocaine abuse, uncomplicated: Secondary | ICD-10-CM

## 2015-07-23 DIAGNOSIS — I739 Peripheral vascular disease, unspecified: Secondary | ICD-10-CM

## 2015-07-23 DIAGNOSIS — R0600 Dyspnea, unspecified: Secondary | ICD-10-CM

## 2015-07-23 LAB — TROPONIN I

## 2015-07-23 LAB — I-STAT VENOUS BLOOD GAS, ED
BICARBONATE: 26.2 meq/L — AB (ref 20.0–24.0)
O2 SAT: 90 %
PH VEN: 7.363 — AB (ref 7.250–7.300)
PO2 VEN: 61 mmHg — AB (ref 30.0–45.0)
TCO2: 28 mmol/L (ref 0–100)
pCO2, Ven: 46 mmHg (ref 45.0–50.0)

## 2015-07-23 LAB — CBC WITH DIFFERENTIAL/PLATELET
Basophils Absolute: 0 10*3/uL (ref 0.0–0.1)
Basophils Relative: 0 %
EOS ABS: 0 10*3/uL (ref 0.0–0.7)
EOS PCT: 0 %
HCT: 37.9 % — ABNORMAL LOW (ref 39.0–52.0)
Hemoglobin: 12.6 g/dL — ABNORMAL LOW (ref 13.0–17.0)
LYMPHS ABS: 1.1 10*3/uL (ref 0.7–4.0)
Lymphocytes Relative: 16 %
MCH: 28.6 pg (ref 26.0–34.0)
MCHC: 33.2 g/dL (ref 30.0–36.0)
MCV: 86.1 fL (ref 78.0–100.0)
MONOS PCT: 4 %
Monocytes Absolute: 0.3 10*3/uL (ref 0.1–1.0)
Neutro Abs: 5.7 10*3/uL (ref 1.7–7.7)
Neutrophils Relative %: 80 %
PLATELETS: 150 10*3/uL (ref 150–400)
RBC: 4.4 MIL/uL (ref 4.22–5.81)
RDW: 14.4 % (ref 11.5–15.5)
WBC: 7.1 10*3/uL (ref 4.0–10.5)

## 2015-07-23 LAB — COMPREHENSIVE METABOLIC PANEL
ALT: 22 U/L (ref 17–63)
AST: 27 U/L (ref 15–41)
Albumin: 3.6 g/dL (ref 3.5–5.0)
Alkaline Phosphatase: 80 U/L (ref 38–126)
Anion gap: 12 (ref 5–15)
BUN: 18 mg/dL (ref 6–20)
CHLORIDE: 97 mmol/L — AB (ref 101–111)
CO2: 25 mmol/L (ref 22–32)
CREATININE: 1.9 mg/dL — AB (ref 0.61–1.24)
Calcium: 9.4 mg/dL (ref 8.9–10.3)
GFR calc Af Amer: 43 mL/min — ABNORMAL LOW (ref >60)
GFR, EST NON AFRICAN AMERICAN: 37 mL/min — AB (ref >60)
Glucose, Bld: 380 mg/dL — ABNORMAL HIGH (ref 65–99)
Potassium: 4 mmol/L (ref 3.5–5.1)
Sodium: 134 mmol/L — ABNORMAL LOW (ref 135–145)
Total Bilirubin: 0.2 mg/dL — ABNORMAL LOW (ref 0.3–1.2)
Total Protein: 7.2 g/dL (ref 6.5–8.1)

## 2015-07-23 LAB — BRAIN NATRIURETIC PEPTIDE: B Natriuretic Peptide: 96.7 pg/mL (ref 0.0–100.0)

## 2015-07-23 MED ORDER — ALBUTEROL SULFATE (2.5 MG/3ML) 0.083% IN NEBU
5.0000 mg | INHALATION_SOLUTION | Freq: Once | RESPIRATORY_TRACT | Status: AC
Start: 2015-07-23 — End: 2015-07-23
  Administered 2015-07-23: 5 mg via RESPIRATORY_TRACT
  Filled 2015-07-23: qty 6

## 2015-07-23 MED ORDER — VITAMIN D 1000 UNITS PO TABS: 2000.0000 [IU] | ORAL_TABLET | Freq: Every day | ORAL | Status: DC

## 2015-07-23 MED ORDER — SODIUM CHLORIDE 0.9 % IV SOLN: 250.0000 mL | INTRAVENOUS | Status: DC | PRN

## 2015-07-23 MED ORDER — DARIFENACIN HYDROBROMIDE ER 15 MG PO TB24
15.0000 mg | ORAL_TABLET | Freq: Every day | ORAL | Status: DC
  Administered 2015-07-24 – 2015-07-26 (×3): 15 mg via ORAL
  Filled 2015-07-23 (×3): qty 1

## 2015-07-23 MED ORDER — IRBESARTAN 75 MG PO TABS
37.5000 mg | ORAL_TABLET | Freq: Every day | ORAL | Status: DC
  Administered 2015-07-24 – 2015-07-26 (×3): 37.5 mg via ORAL
  Filled 2015-07-23 (×3): qty 0.5

## 2015-07-23 MED ORDER — CETYLPYRIDINIUM CHLORIDE 0.05 % MT LIQD
7.0000 mL | Freq: Two times a day (BID) | OROMUCOSAL | Status: DC
  Administered 2015-07-23 – 2015-07-24 (×2): 7 mL via OROMUCOSAL

## 2015-07-23 MED ORDER — CLOPIDOGREL BISULFATE 75 MG PO TABS
75.0000 mg | ORAL_TABLET | Freq: Every day | ORAL | Status: DC
  Administered 2015-07-24 – 2015-07-26 (×3): 75 mg via ORAL
  Filled 2015-07-23 (×3): qty 1

## 2015-07-23 MED ORDER — PANTOPRAZOLE SODIUM 40 MG PO TBEC
40.0000 mg | DELAYED_RELEASE_TABLET | Freq: Every day | ORAL | Status: DC
  Administered 2015-07-24 – 2015-07-26 (×3): 40 mg via ORAL
  Filled 2015-07-23 (×3): qty 1

## 2015-07-23 MED ORDER — FAMOTIDINE 20 MG PO TABS
20.0000 mg | ORAL_TABLET | Freq: Every day | ORAL | Status: DC
  Administered 2015-07-23 – 2015-07-26 (×4): 20 mg via ORAL
  Filled 2015-07-23 (×4): qty 1

## 2015-07-23 MED ORDER — PANTOPRAZOLE SODIUM 40 MG PO TBEC: 40.0000 mg | DELAYED_RELEASE_TABLET | Freq: Every day | ORAL | Status: DC

## 2015-07-23 MED ORDER — ALBUTEROL SULFATE HFA 108 (90 BASE) MCG/ACT IN AERS: 2.0000 | INHALATION_SPRAY | Freq: Four times a day (QID) | RESPIRATORY_TRACT | Status: DC | PRN

## 2015-07-23 MED ORDER — GABAPENTIN 300 MG PO CAPS: 300.0000 mg | ORAL_CAPSULE | Freq: Two times a day (BID) | ORAL | Status: DC

## 2015-07-23 MED ORDER — BUDESONIDE-FORMOTEROL FUMARATE 160-4.5 MCG/ACT IN AERO
2.0000 | INHALATION_SPRAY | Freq: Two times a day (BID) | RESPIRATORY_TRACT | Status: DC
  Administered 2015-07-24 – 2015-07-26 (×4): 2 via RESPIRATORY_TRACT
  Filled 2015-07-23: qty 6

## 2015-07-23 MED ORDER — GABAPENTIN 300 MG PO CAPS
600.0000 mg | ORAL_CAPSULE | Freq: Every day | ORAL | Status: DC
  Administered 2015-07-23 – 2015-07-25 (×3): 600 mg via ORAL
  Filled 2015-07-23 (×3): qty 2

## 2015-07-23 MED ORDER — ATORVASTATIN CALCIUM 20 MG PO TABS
20.0000 mg | ORAL_TABLET | Freq: Every day | ORAL | Status: DC
  Administered 2015-07-24 – 2015-07-26 (×3): 20 mg via ORAL
  Filled 2015-07-23 (×3): qty 1

## 2015-07-23 MED ORDER — IPRATROPIUM-ALBUTEROL 0.5-2.5 (3) MG/3ML IN SOLN: 3.0000 mL | Freq: Four times a day (QID) | RESPIRATORY_TRACT | Status: DC | PRN

## 2015-07-23 MED ORDER — NITROGLYCERIN 0.4 MG SL SUBL
0.4000 mg | SUBLINGUAL_TABLET | SUBLINGUAL | Status: DC | PRN
Start: 2015-07-23 — End: 2015-07-26

## 2015-07-23 MED ORDER — CLOPIDOGREL BISULFATE 75 MG PO TABS: 75.0000 mg | ORAL_TABLET | Freq: Every day | ORAL | Status: DC

## 2015-07-23 MED ORDER — ALPRAZOLAM 0.25 MG PO TABS
0.2500 mg | ORAL_TABLET | Freq: Every day | ORAL | Status: DC
  Administered 2015-07-23 – 2015-07-25 (×3): 0.25 mg via ORAL
  Filled 2015-07-23 (×3): qty 1

## 2015-07-23 MED ORDER — NITROGLYCERIN 0.4 MG SL SUBL
0.4000 mg | SUBLINGUAL_TABLET | Freq: Once | SUBLINGUAL | Status: DC
  Filled 2015-07-23: qty 1

## 2015-07-23 MED ORDER — FUROSEMIDE 40 MG PO TABS
40.0000 mg | ORAL_TABLET | Freq: Two times a day (BID) | ORAL | Status: DC
  Administered 2015-07-24 – 2015-07-26 (×5): 40 mg via ORAL
  Filled 2015-07-23 (×5): qty 1

## 2015-07-23 MED ORDER — DILTIAZEM HCL ER COATED BEADS 240 MG PO CP24
240.0000 mg | ORAL_CAPSULE | Freq: Every day | ORAL | Status: DC
  Administered 2015-07-24 – 2015-07-26 (×3): 240 mg via ORAL
  Filled 2015-07-23 (×3): qty 1

## 2015-07-23 MED ORDER — VITAMIN D 1000 UNITS PO TABS
2000.0000 [IU] | ORAL_TABLET | Freq: Every day | ORAL | Status: DC
  Administered 2015-07-24 – 2015-07-26 (×3): 2000 [IU] via ORAL
  Filled 2015-07-23 (×4): qty 2

## 2015-07-23 MED ORDER — INSULIN GLARGINE 100 UNIT/ML ~~LOC~~ SOLN
35.0000 [IU] | Freq: Every day | SUBCUTANEOUS | Status: DC
  Administered 2015-07-24 – 2015-07-25 (×2): 35 [IU] via SUBCUTANEOUS
  Filled 2015-07-23 (×2): qty 0.35

## 2015-07-23 MED ORDER — ISOSORBIDE MONONITRATE ER 60 MG PO TB24
60.0000 mg | ORAL_TABLET | Freq: Every day | ORAL | Status: DC
  Administered 2015-07-24 – 2015-07-26 (×3): 60 mg via ORAL
  Filled 2015-07-23 (×3): qty 1

## 2015-07-23 MED ORDER — HEPARIN SODIUM (PORCINE) 5000 UNIT/ML IJ SOLN
5000.0000 [IU] | Freq: Three times a day (TID) | INTRAMUSCULAR | Status: DC
  Administered 2015-07-23 – 2015-07-26 (×7): 5000 [IU] via SUBCUTANEOUS
  Filled 2015-07-23 (×7): qty 1

## 2015-07-23 MED ORDER — GLIPIZIDE 10 MG PO TABS
10.0000 mg | ORAL_TABLET | Freq: Two times a day (BID) | ORAL | Status: DC
  Administered 2015-07-24 – 2015-07-26 (×5): 10 mg via ORAL
  Filled 2015-07-23 (×7): qty 1

## 2015-07-23 MED ORDER — ALBUTEROL SULFATE (2.5 MG/3ML) 0.083% IN NEBU: 2.5000 mg | INHALATION_SOLUTION | RESPIRATORY_TRACT | Status: DC | PRN

## 2015-07-23 MED ORDER — GABAPENTIN 300 MG PO CAPS
300.0000 mg | ORAL_CAPSULE | Freq: Every day | ORAL | Status: DC
  Administered 2015-07-24 – 2015-07-26 (×3): 300 mg via ORAL
  Filled 2015-07-23 (×3): qty 1

## 2015-07-23 MED ORDER — POTASSIUM CHLORIDE CRYS ER 20 MEQ PO TBCR
20.0000 meq | EXTENDED_RELEASE_TABLET | Freq: Once | ORAL | Status: AC
  Administered 2015-07-23: 20 meq via ORAL
  Filled 2015-07-23: qty 1

## 2015-07-23 MED ORDER — DEXTROSE 5 % IV SOLN: 1.0000 g | INTRAVENOUS | Status: DC

## 2015-07-23 MED ORDER — FUROSEMIDE 10 MG/ML IJ SOLN
80.0000 mg | Freq: Once | INTRAMUSCULAR | Status: AC
  Administered 2015-07-23: 80 mg via INTRAVENOUS
  Filled 2015-07-23: qty 8

## 2015-07-23 MED ORDER — CITALOPRAM HYDROBROMIDE 20 MG PO TABS
20.0000 mg | ORAL_TABLET | Freq: Two times a day (BID) | ORAL | Status: DC
  Administered 2015-07-23 – 2015-07-26 (×6): 20 mg via ORAL
  Filled 2015-07-23 (×6): qty 1

## 2015-07-23 MED ORDER — IPRATROPIUM BROMIDE 0.02 % IN SOLN
0.5000 mg | Freq: Once | RESPIRATORY_TRACT | Status: AC
  Administered 2015-07-23: 0.5 mg via RESPIRATORY_TRACT
  Filled 2015-07-23: qty 2.5

## 2015-07-23 MED ORDER — ALFUZOSIN HCL ER 10 MG PO TB24
10.0000 mg | ORAL_TABLET | Freq: Every day | ORAL | Status: DC
  Administered 2015-07-24 – 2015-07-26 (×3): 10 mg via ORAL
  Filled 2015-07-23 (×4): qty 1

## 2015-07-23 NOTE — ED Notes (Signed)
Resp at bedside

## 2015-07-23 NOTE — ED Notes (Signed)
Called resp to come fix bipap, pt stated no air was moving through it. Resp stated to take it off pt until they can get down here to look at it.

## 2015-07-23 NOTE — ED Notes (Signed)
Resp removed pt from bipap and placed on 5L [place]. Critical care at bedside

## 2015-07-23 NOTE — Telephone Encounter (Signed)
Per home health nurse, patient has gained 7 lbs since July 21, 2015. Patient's weight on 07/20/15 was 273.5 lbs. On 07/21/15 weight was 271.5 lbs, on the 07/22/15 weight was 276 lbs, and today weight is 278 lbs. Patient was unable to give a baseline weight. Per home health nurse, upon admission, patient's weight was 271 lbs. Per home health nurse, patient has BLE edema, increased sob, rales and wheezing in all lobes, chest pain and dizziness when coughing. Patient rated the chest pain 8/10 and has used nitroglycerin x's 2 with relief after 2nd dose. Patient is also sleeping in recliner. Patient said that last week, he increased his furosemide 40 mg daily to alternating it with twice daily every other day. Patient is on 6 L O2 via . Nurse advised home health nurse and patient that he needed to go to the ED for an evaluation. Nurse and patient verbalized understanding of plan.

## 2015-07-23 NOTE — ED Notes (Signed)
Pt to department via EMS- pt reports chest pain that started yesterday. Pt had wheezing on EMS arrival, was given 1 nitro with some help with pain. 5mg  albuterol with wheezing on the EMS truck. 125mg  solu-medrol. Then given duo-neb on arrival. [id number] Bp-110 Hr-85 22g.

## 2015-07-23 NOTE — H&P (Signed)
Name: [name] MRN: [id number] DOB: 02-Feb-1956    ADMISSION DATE:  07/23/2015 CONSULTATION DATE:  07/23/15  REFERRING MD :  EDP  CHIEF COMPLAINT:  SOB  BRIEF PATIENT DESCRIPTION: 60 y.o. male with DIP / ILD, chronic hypoxic respiratory failure (6L chronically), brought to [facility] ED 01/03 with SOB and weight gain.  PCCM called for admission.  SIGNIFICANT EVENTS  07/05/15 through 07/09/15 > admitted with AoC hypoxic respiratory failure in the setting of decompensated heart failure and pulmonary edema. 07/19/15 > OP visit with pulmonary.  Had been doing well with autotitration CPAP. 07/23/15 > admitted with 7lb weight gain, volume overload, SOB.  STUDIES:  CXR 01/03 > chronic changes without acute process.   HISTORY OF PRESENT ILLNESS:  [name] is a 60 y.o. male with a PMH as outlined below including DIP with Langerhan's cell histiocytosis ILD due to ongoing smoking, OSA recently started CPAP (never used previously due to claustrophobia and incorrect fit), probable baseline COPD, chronic hypoxic respiratory failure on 6L O2 24/7.  He was brought to [facility] ED 07/23/15 due to SOB and reported 7lb weight gain in the past week.  He states that his home health RN checked on him that day and noticed his weight increase which prompted her to call cardiologist who recommended that he come to ED for further evaluation.  He has had SOB with wheezing that has been worse than baseline over the past week as well.  Denies any cough, fevers/chills/sweats, N/V/D, abd pain, myalgias, exposures to known sick contacts, recent travel.  He did have some mild chest pain as well as increase in LE edema.  CXR in ED did not show any acute process.  He did have increased WOB on initial presentation for which he was placed on BiPAP.  During my assessment, he was no longer on BiPAP and he was breathing / resting comfortably with mild wheezing.  Labs were essentially non-significant.  He was admitted for AoC hypoxic  respiratory failure, decompensated dCHF with volume overload, and SOB presumed due to decompensated dCHF.  He had recent admission 07/05/15 through 07/09/15 for AoC hypoxic respiratory failure in the setting of decompensated heart failure and pulmonary edema.  Following discharge, he had follow up in our office 07/19/15.  He had been using CPAP with autotitration since discharge and reported that it had helped significantly.  PAST MEDICAL HISTORY :   has a past medical history of Essential hypertension, benign; History of stroke; Noncompliance; COPD (chronic obstructive pulmonary disease) ([place]); Hyperlipidemia; History of pneumonia; Type 2 diabetes mellitus ([place]) (2011); GERD (gastroesophageal reflux disease); Cocaine abuse; Cholecystitis, acute (12/20/2013); Coronary atherosclerosis of native coronary artery; Depression; Anxiety; History of nephrolithiasis; Arthritis; Hepatitis (Late 1970s); Sleep apnea; Renal cell carcinoma ([place]); and Headache.  has past surgical history that includes Appendectomy (1970's); Cholecystectomy (N/A, 12/22/2013); Robot assisted laparoscopic nephrectomy (Right, 02/21/2014); Laparoscopic lysis of adhesions (02/21/2014); Video assisted thoracoscopy (Left, 05/18/2014); Lung biopsy (Left, 05/18/2014); Video bronchoscopy (05/18/2014); left heart catheterization with coronary angiogram (N/A, 06/02/2012); left heart catheterization with coronary angiogram (N/A, 03/09/2013); left heart catheterization with coronary angiogram (N/A, 12/20/2013); Endarterectomy (Right, 10/08/2014); and Patch angioplasty (Right, 10/08/2014). Prior to Admission medications   Medication Sig Start Date End Date Taking? Authorizing Provider  albuterol (PROVENTIL HFA;VENTOLIN HFA) 108 (90 BASE) MCG/ACT inhaler Inhale 2 puffs into the lungs every 6 (six) hours as needed for wheezing or shortness of breath.   Yes Historical Provider, MD  albuterol (PROVENTIL) (2.5 MG/3ML) 0.083% nebulizer solution Take 3 mLs (  2.5 mg total)  by nebulization every 3 (three) hours as needed for wheezing or shortness of breath. 02/27/14  Yes Nita Sells, MD  alfuzosin (UROXATRAL) 10 MG 24 hr tablet Take 10 mg by mouth daily with breakfast.   Yes Historical Provider, MD  ALPRAZolam (XANAX) 0.25 MG tablet Take 0.25 mg by mouth at bedtime.    Yes Historical Provider, MD  atorvastatin (LIPITOR) 20 MG tablet Take 20 mg by mouth daily.   Yes Historical Provider, MD  budesonide-formoterol (SYMBICORT) 160-4.5 MCG/ACT inhaler Inhale 2 puffs into the lungs 2 (two) times daily. 05/25/14  Yes Coolidge Breeze, PA-C  cholecalciferol (VITAMIN D) 1000 UNITS tablet Take 2,000 Units by mouth daily.   Yes Historical Provider, MD  citalopram (CELEXA) 20 MG tablet Take 20 mg by mouth 2 (two) times daily.    Yes Historical Provider, MD  clopidogrel (PLAVIX) 75 MG tablet Take 1 tablet (75 mg total) by mouth daily. 09/26/14  Yes Cameron Sprang, MD  diltiazem (CARDIZEM CD) 240 MG 24 hr capsule Take 240 mg by mouth daily.   Yes Historical Provider, MD  Fish Oil-Cholecalciferol (FISH OIL + D3 PO) Take 1 capsule by mouth 4 (four) times daily.    Yes Historical Provider, MD  furosemide (LASIX) 40 MG tablet Take 40-80 mg by mouth as directed. Alternates with 40 mg and 80 mg every other day   Yes Historical Provider, MD  gabapentin (NEURONTIN) 300 MG capsule Take 300-600 mg by mouth 2 (two) times daily. 1 cap in morning and 2 caps at night.   Yes Historical Provider, MD  glipiZIDE (GLUCOTROL) 10 MG tablet Take 10 mg by mouth 2 (two) times daily.   Yes Historical Provider, MD  insulin glargine (LANTUS) 100 UNIT/ML injection Inject 0.25 mLs (25 Units total) into the skin daily. Patient taking differently: Inject 35 Units into the skin daily.  07/09/15  Yes Erick Colace, NP  ipratropium-albuterol (DUONEB) 0.5-2.5 (3) MG/3ML SOLN Take 3 mLs by nebulization every 6 (six) hours as needed (Shortness of breath).   Yes Historical Provider, MD  isosorbide mononitrate (IMDUR)  60 MG 24 hr tablet Take 1 tablet (60 mg total) by mouth daily. 02/01/15  Yes Satira Sark, MD  nitroGLYCERIN (NITROSTAT) 0.4 MG SL tablet Place 1 tablet (0.4 mg total) under the tongue every 5 (five) minutes as needed for chest pain. 12/18/14  Yes Satira Sark, MD  omeprazole (PRILOSEC) 40 MG capsule Take 40 mg by mouth daily.   Yes Historical Provider, MD  ranitidine (ZANTAC) 300 MG tablet Take 300 mg by mouth at bedtime.    Yes Historical Provider, MD  solifenacin (VESICARE) 5 MG tablet Take 5 mg by mouth daily.   Yes Historical Provider, MD  valsartan (DIOVAN) 40 MG tablet Take 20 mg by mouth daily.    Yes Historical Provider, MD   Allergies  Allergen Reactions  . Oxycodone Itching and Nausea Only    FAMILY HISTORY:  family history includes CAD in his brother and father; Cancer in his maternal grandfather, maternal grandmother, and mother; Lung cancer in his father. SOCIAL HISTORY:  reports that he has been smoking Cigarettes.  He started smoking about 43 years ago. He has a 20.5 pack-year smoking history. He has never used smokeless tobacco. He reports that he does not drink alcohol or use illicit drugs.  REVIEW OF SYSTEMS:   All negative; except for those that are bolded, which indicate positives.  Constitutional: weight loss, weight gain, night sweats,  fevers, chills, fatigue, weakness.  HEENT: headaches, sore throat, sneezing, nasal congestion, post nasal drip, difficulty swallowing, tooth/dental problems, visual complaints, visual changes, ear aches. Neuro: difficulty with speech, weakness, numbness, ataxia. CV:  chest pain, orthopnea, PND, swelling in lower extremities, dizziness, palpitations, syncope.  Resp: cough, hemoptysis, dyspnea, wheezing. GI  heartburn, indigestion, abdominal pain, nausea, vomiting, diarrhea, constipation, change in bowel habits, loss of appetite, hematemesis, melena, hematochezia.  GU: dysuria, change in color of urine, urgency or frequency, flank  pain, hematuria. MSK: joint pain or swelling, decreased range of motion. Psych: change in mood or affect, depression, anxiety, suicidal ideations, homicidal ideations. Skin: rash, itching, bruising.   SUBJECTIVE:  SOB has improved a little after BiPAP but still doesn't feel back to baseline.  Has had ~ 7lb weight gain over past 5 - 6 days (per report) along with increase in LE edema.  Has also had increased wheezing but no cough, fevers/chills/sweats, increase in O2 needs.  VITAL SIGNS: Temp:  [98.4 F (36.9 C)] 98.4 F (36.9 C) (01/03 1658) Pulse Rate:  [82-97] 90 (01/03 2100) Resp:  [15-30] 24 (01/03 2100) BP: (128-142)/(62-72) 137/62 mmHg (01/03 2100) SpO2:  [94 %-98 %] 94 % (01/03 2100) FiO2 (%):  [45 %] 45 % (01/03 1736) Weight:  [126.1 kg (278 lb)] 126.1 kg (278 lb) (01/03 1658)  PHYSICAL EXAMINATION: General: Adult male, resting in bed, in NAD. Neuro: A&O x 3, non-focal.  HEENT: [place]/AT. PERRL, sclerae anicteric. Cardiovascular: RRR, no M/R/G.  Lungs: Respirations even and unlabored.  Bilateral crackles with faint wheeze (? Cardiac wheeze). Abdomen: Obese, BS x 4, soft, NT/ND.  Musculoskeletal: No gross deformities, 2+ edema bilaterally. Skin: Intact, warm, no rashes.     Recent Labs Lab 07/19/15 0857 07/23/15 1803  NA 130* 134*  K 4.1 4.0  CL 91* 97*  CO2 27 25  BUN 21 18  CREATININE 1.86* 1.90*  GLUCOSE 434* 380*    Recent Labs Lab 07/23/15 1803  HGB 12.6*  HCT 37.9*  WBC 7.1  PLT 150   Dg Chest Portable 1 View  07/23/2015  CLINICAL DATA:  60 year old male with a history of chest pain and shortness of breath. EXAM: PORTABLE CHEST 1 VIEW COMPARISON:  Multiple prior plain film, 07/19/2015, 07/09/2015, 07/08/2015, 07/06/2015 FINDINGS: Cardiomediastinal silhouette unchanged in size and contour. Similar positioning of the chest with apical lordotic position. Diffuse coarsening of interstitial markings with low lung volumes. No confluent airspace disease. No  pneumothorax or pleural effusion. IMPRESSION: Similar appearance of the chest x-ray with chronic lung changes, cardiomegaly, and no evidence of superimposed acute cardiopulmonary disease. Signed, Dulcy Fanny. Earleen Newport, DO [facility] Radiology Electronically Signed   By: Corrie Mckusick D.O.   On: 07/23/2015 18:08    ASSESSMENT / PLAN:  Known DIP with Langerhan's cell histiocytosis ILD. Acute on chronic hypoxemic respiratory failure - due to above; on 6L O2 chronically.  Suspect his acute episode is due to volume overload from decompensated dCHF. OSA - recently started CPAP after last admission; previously did not use due to incorrect fit and claustrophobia. Ongoing tobacco use disorder. Prior notes mention possible baseline COPD however PFT's from 2016 do not support (ratio 81% (107% pred), FEV1 3.28). Plan: Continue supplemental O2 to maintain SpO2 > 90%. Lasix 80mg  now then start 40mg  BID starting tomorrow AM 01/014 (note, as outpatient he alternates 40 and 80 daily). Continue nocturnal CPAP. CXR in AM. Tobacco cessation counseling. Continue outpatient BD's / ICS's. Defer abx for now. Defer  systemic steroids as I suspect his wheeze is primarily cardiac at this point. Assess PCT, if high then consider empiric abx.  Chest pain - initial troponin and EKG negative. AoC dCHF with concern for possible decompensation - Echo from 07/05/15 with EF 60 - 65%, grade 1 DD. Hx HLD, HTN, CAD. Plan: Repeat troponin. Continue outpatient cardiac regimen (atorvastatin, clopidogrel, diltiazem, imdur, valsartain). Defer repeat echo for now.  GERD. Obesity. Plan: Continue outpatient PPI / H2 blocker.  AoCKD. Mild hyponatremia - suspect due to volume overload. Plan: Diuresis as ordered. BMP in AM.  DM. Plan: Continue outpatient lantus, glipizide.  Hx anxiety / depression. Plan: Continue outpatient alprazolam, citalopram, gabapentin.   Best  Practice: Code Status:  Full. Diet: Heart Healthy / Carb Mod. GI prophylaxis:  PPI. VTE prophylaxis:  SCD's / heparin.    Montey Hora, [facility] Pager: [phone]  or [phone] 07/23/2015, 9:21 PM

## 2015-07-23 NOTE — ED Notes (Signed)
Pt placed on Bipap. Family at the bedside. Pt reports his chest feels better after being placed on Bipap machine.

## 2015-07-23 NOTE — ED Provider Notes (Signed)
CSN: [id number]     Arrival date & time 07/23/15  1654 History   First MD Initiated Contact with Patient 07/23/15 1658     Chief Complaint  Patient presents with  . Chest Pain  . Shortness of Breath      Patient is a 60 y.o. male presenting with chest pain and shortness of breath. The history is provided by the patient.  Chest Pain Associated symptoms: shortness of breath   Associated symptoms: no abdominal pain, no back pain, no dysphagia, no fatigue, no fever and no headache   Shortness of Breath Associated symptoms: chest pain and wheezing   Associated symptoms: no abdominal pain, no fever and no headaches    patient presents with shortness of breath and chest pain. These had shortness breath over last few days but worse today. Also worsening shortness of breath. Feels tight. His weight has been up around 5-7 pounds over the last 3-4 days. States his chest feels tight and goes to his right arm. He has a history of interstitial lung disease and CHF. States he feels like this is more for lung problem as opposed to his heart problem. He states he does however have more swelling in his legs. Denies cough or sputum production. The pain is dull and constant. He is rather dyspneic. No nausea or fevers or diarrhea. He is on 6 L of oxygen at baseline.  Past Medical History  Diagnosis Date  . Essential hypertension, benign   . History of stroke     Right MCA distribution, residual left-sided weakness  . Noncompliance   . COPD (chronic obstructive pulmonary disease) ([place])   . Hyperlipidemia   . History of pneumonia   . Type 2 diabetes mellitus ([place]) 2011  . GERD (gastroesophageal reflux disease)   . Cocaine abuse   . Cholecystitis, acute 12/20/2013    Lap chole 6/5  . Coronary atherosclerosis of native coronary artery     a. 03/09/2013 Cath/PCI: LM nl, LAD: 50p, 36m (2.5x16 promus DES), LCX nl, OM1 min irregs, LPL/LPDA diff dzs, RCA nondom, mod diff dzs, EF 55%.  . Depression   . Anxiety   .  History of nephrolithiasis   . Arthritis   . Hepatitis Late 1970s  . Sleep apnea     On CPAP, 4L O2 no cpap at home yet  . Renal cell carcinoma [facility])     Status post radical right nephrectomy August 2015  . Headache    Past Surgical History  Procedure Laterality Date  . Appendectomy  1970's  . Cholecystectomy N/A 12/22/2013    Procedure: LAPAROSCOPIC CHOLECYSTECTOMY ;  Surgeon: [name], MD;  Location: [place];  Service: General;  Laterality: N/A;  . Robot assisted laparoscopic nephrectomy Right 02/21/2014    Procedure: ROBOTIC ASSISTED LAPAROSCOPIC RIGHT NEPHRECTOMY ;  Surgeon: [name], MD;  Location: [facility];  Service: Urology;  Laterality: Right;  . Laparoscopic lysis of adhesions  02/21/2014    Procedure: LAPAROSCOPIC LYSIS OF ADHESIONS EXTINSIVE;  Surgeon: [name], MD;  Location: [facility];  Service: Urology;;  . Video assisted thoracoscopy Left 05/18/2014    Procedure: LEFT VIDEO ASSISTED THORACOSCOPY;  Surgeon: [name], MD;  Location: [place];  Service: Thoracic;  Laterality: Left;  . Lung biopsy Left 05/18/2014    Procedure: LUNG BIOPSY left upper lobe & left lower lobe;  Surgeon: [name], MD;  Location: [place];  Service: Thoracic;  Laterality: Left;  . Video bronchoscopy  05/18/2014  Procedure: VIDEO BRONCHOSCOPY;  Surgeon: [name], MD;  Location: [place];  Service: Thoracic;;  . Left heart catheterization with coronary angiogram N/A 06/02/2012    Procedure: LEFT HEART CATHETERIZATION WITH CORONARY ANGIOGRAM;  Surgeon: [name], MD;  Location: [facility] CATH LAB;  Service: Cardiovascular;  Laterality: N/A;  . Left heart catheterization with coronary angiogram N/A 03/09/2013    Procedure: LEFT HEART CATHETERIZATION WITH CORONARY ANGIOGRAM;  Surgeon: [name], MD;  Location: [facility] CATH LAB;  Service: Cardiovascular;  Laterality: N/A;  . Left heart catheterization with coronary angiogram N/A 12/20/2013    Procedure: LEFT HEART  CATHETERIZATION WITH CORONARY ANGIOGRAM;  Surgeon: [name], MD;  Location: [facility] CATH LAB;  Service: Cardiovascular;  Laterality: N/A;  . Endarterectomy Right 10/08/2014    Procedure: Right ENDARTERECTOMY CAROTID;  Surgeon: [name], MD;  Location: [place];  Service: Vascular;  Laterality: Right;  . Patch angioplasty Right 10/08/2014    Procedure: PATCH ANGIOPLASTY Right Carotid;  Surgeon: [name], MD;  Location: [facility] OR;  Service: Vascular;  Laterality: Right;   Family History  Problem Relation Age of Onset  . Hypertension    . Diabetes    . Stroke    . Lung cancer Father   . Cancer Mother     Thyroid - living in her 13's.  . Cancer Maternal Grandmother     Breast  . Cancer Maternal Grandfather     Throat and stomach  . CAD Father   . CAD Brother    Social History  Substance Use Topics  . Smoking status: Current Some Day Smoker -- 0.50 packs/day for 41 years    Types: Cigarettes    Start date: 05/03/1972  . Smokeless tobacco: Never Used     Comment: 04/12/15 says smokes 2 per day, 1 in the AM and 1 in the PM  . Alcohol Use: No    Review of Systems  Constitutional: Negative for fever, chills, appetite change and fatigue.  HENT: Negative for trouble swallowing.   Eyes: Negative for visual disturbance.  Respiratory: Positive for chest tightness, shortness of breath and wheezing.   Cardiovascular: Positive for chest pain and leg swelling.  Gastrointestinal: Negative for abdominal pain.  Genitourinary: Negative for flank pain.  Musculoskeletal: Negative for back pain.  Skin: Negative for pallor.  Neurological: Negative for syncope and headaches.      Allergies  Oxycodone  Home Medications   Prior to Admission medications   Medication Sig Start Date End Date Taking? Authorizing Provider  albuterol (PROVENTIL HFA;VENTOLIN HFA) 108 (90 BASE) MCG/ACT inhaler Inhale 2 puffs into the lungs every 6 (six) hours as needed for wheezing or shortness of breath.    Yes Historical Provider, MD  albuterol (PROVENTIL) (2.5 MG/3ML) 0.083% nebulizer solution Take 3 mLs (2.5 mg total) by nebulization every 3 (three) hours as needed for wheezing or shortness of breath. 02/27/14  Yes Nita Sells, MD  alfuzosin (UROXATRAL) 10 MG 24 hr tablet Take 10 mg by mouth daily with breakfast.   Yes Historical Provider, MD  ALPRAZolam (XANAX) 0.25 MG tablet Take 0.25 mg by mouth at bedtime.    Yes Historical Provider, MD  atorvastatin (LIPITOR) 20 MG tablet Take 20 mg by mouth daily.   Yes Historical Provider, MD  budesonide-formoterol (SYMBICORT) 160-4.5 MCG/ACT inhaler Inhale 2 puffs into the lungs 2 (two) times daily. 05/25/14  Yes Coolidge Breeze, PA-C  cholecalciferol (VITAMIN D) 1000 UNITS tablet Take 2,000 Units by mouth daily.  Yes Historical Provider, MD  citalopram (CELEXA) 20 MG tablet Take 20 mg by mouth 2 (two) times daily.    Yes Historical Provider, MD  clopidogrel (PLAVIX) 75 MG tablet Take 1 tablet (75 mg total) by mouth daily. 09/26/14  Yes Cameron Sprang, MD  diltiazem (CARDIZEM CD) 240 MG 24 hr capsule Take 240 mg by mouth daily.   Yes Historical Provider, MD  Fish Oil-Cholecalciferol (FISH OIL + D3 PO) Take 1 capsule by mouth 4 (four) times daily.    Yes Historical Provider, MD  furosemide (LASIX) 40 MG tablet Take 40-80 mg by mouth as directed. Alternates with 40 mg and 80 mg every other day   Yes Historical Provider, MD  gabapentin (NEURONTIN) 300 MG capsule Take 300-600 mg by mouth 2 (two) times daily. 1 cap in morning and 2 caps at night.   Yes Historical Provider, MD  glipiZIDE (GLUCOTROL) 10 MG tablet Take 10 mg by mouth 2 (two) times daily.   Yes Historical Provider, MD  insulin glargine (LANTUS) 100 UNIT/ML injection Inject 0.25 mLs (25 Units total) into the skin daily. Patient taking differently: Inject 35 Units into the skin daily.  07/09/15  Yes Erick Colace, NP  ipratropium-albuterol (DUONEB) 0.5-2.5 (3) MG/3ML SOLN Take 3 mLs by  nebulization every 6 (six) hours as needed (Shortness of breath).   Yes Historical Provider, MD  isosorbide mononitrate (IMDUR) 60 MG 24 hr tablet Take 1 tablet (60 mg total) by mouth daily. 02/01/15  Yes Satira Sark, MD  nitroGLYCERIN (NITROSTAT) 0.4 MG SL tablet Place 1 tablet (0.4 mg total) under the tongue every 5 (five) minutes as needed for chest pain. 12/18/14  Yes Satira Sark, MD  omeprazole (PRILOSEC) 40 MG capsule Take 40 mg by mouth daily.   Yes Historical Provider, MD  ranitidine (ZANTAC) 300 MG tablet Take 300 mg by mouth at bedtime.    Yes Historical Provider, MD  solifenacin (VESICARE) 5 MG tablet Take 5 mg by mouth daily.   Yes Historical Provider, MD  valsartan (DIOVAN) 40 MG tablet Take 20 mg by mouth daily.    Yes Historical Provider, MD   BP 141/79 mmHg  Pulse 90  Temp(Src) 98.6 F (37 C) (Oral)  Resp 13  Ht 6' (1.829 m)  Wt 275 lb 8 oz (124.966 kg)  BMI 37.36 kg/m2  SpO2 95% Physical Exam  Constitutional: He appears well-developed and well-nourished.  HENT:  Head: Atraumatic.  Neck: No JVD present.  Cardiovascular:  Mild tachycardia  Pulmonary/Chest: He is in respiratory distress. He has wheezes.  Tachypnea with diffuse prolonged expirations wheezes. Few scattered rales.  Abdominal: Soft. There is no tenderness.  Musculoskeletal: He exhibits edema.  Pitting edema to bilateral lower extremities.  Skin: Skin is warm.    ED Course  Procedures (including critical care time) Labs Review Labs Reviewed  COMPREHENSIVE METABOLIC PANEL - Abnormal; Notable for the following:    Sodium 134 (*)    Chloride 97 (*)    Glucose, Bld 380 (*)    Creatinine, Ser 1.90 (*)    Total Bilirubin 0.2 (*)    GFR calc non Af Amer 37 (*)    GFR calc Af Amer 43 (*)    All other components within normal limits  CBC WITH DIFFERENTIAL/PLATELET - Abnormal; Notable for the following:    Hemoglobin 12.6 (*)    HCT 37.9 (*)    All other components within normal limits   I-STAT VENOUS BLOOD GAS, ED - Abnormal;  Notable for the following:    pH, Ven 7.363 (*)    pO2, Ven 61.0 (*)    Bicarbonate 26.2 (*)    All other components within normal limits  BRAIN NATRIURETIC PEPTIDE  TROPONIN I  CBC  BASIC METABOLIC PANEL  MAGNESIUM  PHOSPHORUS  PROCALCITONIN  TROPONIN I    Imaging Review Dg Chest Portable 1 View  07/23/2015  CLINICAL DATA:  60 year old male with a history of chest pain and shortness of breath. EXAM: PORTABLE CHEST 1 VIEW COMPARISON:  Multiple prior plain film, 07/19/2015, 07/09/2015, 07/08/2015, 07/06/2015 FINDINGS: Cardiomediastinal silhouette unchanged in size and contour. Similar positioning of the chest with apical lordotic position. Diffuse coarsening of interstitial markings with low lung volumes. No confluent airspace disease. No pneumothorax or pleural effusion. IMPRESSION: Similar appearance of the chest x-ray with chronic lung changes, cardiomegaly, and no evidence of superimposed acute cardiopulmonary disease. Signed, Dulcy Fanny. Earleen Newport, DO [facility] Electronically Signed   By: Corrie Mckusick D.O.   On: 07/23/2015 18:08   I have personally reviewed and evaluated these images and lab results as part of my medical decision-making.   EKG Interpretation   Date/Time:  Tuesday July 23 2015 17:00:14 EST Ventricular Rate:  93 PR Interval:  189 QRS Duration: 93 QT Interval:  359 QTC Calculation: 446 R Axis:   -56 Text Interpretation:  Sinus rhythm Left anterior fascicular block Probable  anteroseptal infarct, old Confirmed by Koltin Wehmeyer  MD, Refael Fulop [id number]) on  07/23/2015 5:04:48 PM      MDM   Final diagnoses:  Chronic respiratory failure with hypoxia ([name])     patient with shortness of breath. History of chronic lung disease and CHF. Weight is up but has had more dyspnea. BNP reassuring. Chest x-ray is stable. On BiPAP for work of breathing. Admit to pulmonary.  CRITICAL  CARE Performed by: Mackie Pai Total critical care time: 30 minutes Critical care time was exclusive of separately billable procedures and treating other patients. Critical care was necessary to treat or prevent imminent or life-threatening deterioration. Critical care was time spent personally by me on the following activities: development of treatment plan with patient and/or surrogate as well as nursing, discussions with consultants, evaluation of patient's response to treatment, examination of patient, obtaining history from patient or surrogate, ordering and performing treatments and interventions, ordering and review of laboratory studies, ordering and review of radiographic studies, pulse oximetry and re-evaluation of patient's condition.     Davonna Belling, MD 07/23/15 [phone]

## 2015-07-24 ENCOUNTER — Inpatient Hospital Stay (HOSPITAL_COMMUNITY): Payer: Medicaid Other

## 2015-07-24 DIAGNOSIS — J9611 Chronic respiratory failure with hypoxia: Secondary | ICD-10-CM

## 2015-07-24 LAB — CBC
HCT: 38.5 % — ABNORMAL LOW (ref 39.0–52.0)
Hemoglobin: 12.8 g/dL — ABNORMAL LOW (ref 13.0–17.0)
MCH: 28.7 pg (ref 26.0–34.0)
MCHC: 33.2 g/dL (ref 30.0–36.0)
MCV: 86.3 fL (ref 78.0–100.0)
PLATELETS: 169 10*3/uL (ref 150–400)
RBC: 4.46 MIL/uL (ref 4.22–5.81)
RDW: 14.5 % (ref 11.5–15.5)
WBC: 8.4 10*3/uL (ref 4.0–10.5)

## 2015-07-24 LAB — BASIC METABOLIC PANEL
ANION GAP: 14 (ref 5–15)
BUN: 20 mg/dL (ref 6–20)
CALCIUM: 9.2 mg/dL (ref 8.9–10.3)
CO2: 22 mmol/L (ref 22–32)
Chloride: 97 mmol/L — ABNORMAL LOW (ref 101–111)
Creatinine, Ser: 1.73 mg/dL — ABNORMAL HIGH (ref 0.61–1.24)
GFR, EST AFRICAN AMERICAN: 48 mL/min — AB (ref >60)
GFR, EST NON AFRICAN AMERICAN: 41 mL/min — AB (ref >60)
Glucose, Bld: 520 mg/dL — ABNORMAL HIGH (ref 65–99)
Potassium: 4.9 mmol/L (ref 3.5–5.1)
SODIUM: 133 mmol/L — AB (ref 135–145)

## 2015-07-24 LAB — MAGNESIUM: MAGNESIUM: 1.7 mg/dL (ref 1.7–2.4)

## 2015-07-24 LAB — GLUCOSE, CAPILLARY
GLUCOSE-CAPILLARY: 436 mg/dL — AB (ref 65–99)
GLUCOSE-CAPILLARY: 244 mg/dL — AB (ref 65–99)
Glucose-Capillary: 378 mg/dL — ABNORMAL HIGH (ref 65–99)
Glucose-Capillary: 346 mg/dL — ABNORMAL HIGH (ref 65–99)

## 2015-07-24 LAB — TROPONIN I: Troponin I: 0.03 ng/mL (ref <0.031)

## 2015-07-24 LAB — PROCALCITONIN: PROCALCITONIN: 0.14 ng/mL

## 2015-07-24 LAB — PHOSPHORUS: PHOSPHORUS: 3.6 mg/dL (ref 2.5–4.6)

## 2015-07-24 MED ORDER — INSULIN ASPART 100 UNIT/ML ~~LOC~~ SOLN
0.0000 [IU] | Freq: Every day | SUBCUTANEOUS | Status: DC
  Administered 2015-07-24: 4 [IU] via SUBCUTANEOUS
  Administered 2015-07-25: 5 [IU] via SUBCUTANEOUS

## 2015-07-24 MED ORDER — INSULIN ASPART 100 UNIT/ML ~~LOC~~ SOLN
0.0000 [IU] | Freq: Three times a day (TID) | SUBCUTANEOUS | Status: DC
  Administered 2015-07-24: 20 [IU] via SUBCUTANEOUS
  Administered 2015-07-24: 7 [IU] via SUBCUTANEOUS
  Administered 2015-07-24: 20 [IU] via SUBCUTANEOUS
  Administered 2015-07-25 (×2): 11 [IU] via SUBCUTANEOUS
  Administered 2015-07-25 – 2015-07-26 (×2): 7 [IU] via SUBCUTANEOUS

## 2015-07-24 MED ORDER — INSULIN ASPART 100 UNIT/ML ~~LOC~~ SOLN
4.0000 [IU] | Freq: Three times a day (TID) | SUBCUTANEOUS | Status: DC
  Administered 2015-07-24 – 2015-07-26 (×5): 4 [IU] via SUBCUTANEOUS

## 2015-07-24 NOTE — Progress Notes (Signed)
Name: [name] MRN: [id number] DOB: 1955/10/30    ADMISSION DATE:  07/23/2015 CONSULTATION DATE:  07/23/15  REFERRING MD :  EDP  CHIEF COMPLAINT:  SOB   SUBJECTIVE:  Pt reports feeling much better this am.  SOB reduced.  Feels he probably over indulged for the holidays.  Continues to smoke.  Notes his blood sugars have been up for the past month.  Denies infectious symptoms.    VITAL SIGNS: Temp:  [97.8 F (36.6 C)-98.6 F (37 C)] 98.3 F (36.8 C) (01/04 0843) Pulse Rate:  [82-97] 83 (01/04 0843) Resp:  [13-30] 20 (01/04 0843) BP: (120-142)/(62-88) 135/83 mmHg (01/04 0843) SpO2:  [88 %-98 %] 92 % (01/04 0843) FiO2 (%):  [45 %] 45 % (01/03 1736) Weight:  [273 lb 12.8 oz (124.195 kg)-278 lb (126.1 kg)] 273 lb 12.8 oz (124.195 kg) (01/04 0500)  PHYSICAL EXAMINATION: General: Adult male, resting in bed, in NAD. Neuro: A&O x 3, non-focal.  HEENT: [place]/AT. PERRL, sclerae anicteric. Cardiovascular: RRR, no M/R/G.  Lungs: Respirations even and unlabored. Lungs bilaterally diminished lower. No wheezing or crackles.    Abdomen: Obese, BS x 4, soft, NT/ND.  Musculoskeletal: No gross deformities, 2+ edema bilaterally. Skin: Intact, warm, no rashes.     Recent Labs Lab 07/19/15 0857 07/23/15 1803 07/24/15 0001  NA 130* 134* 133*  K 4.1 4.0 4.9  CL 91* 97* 97*  CO2 27 25 22   BUN 21 18 20   CREATININE 1.86* 1.90* 1.73*  GLUCOSE 434* 380* 520*    Recent Labs Lab 07/23/15 1803 07/24/15 0001  HGB 12.6* 12.8*  HCT 37.9* 38.5*  WBC 7.1 8.4  PLT 150 169   Dg Chest Port 1 View  07/24/2015  CLINICAL DATA:  Respiratory failure, shortness of breath EXAM: PORTABLE CHEST 1 VIEW COMPARISON:  Portable chest x-ray of July 23, 2015 FINDINGS: The lungs are adequately inflated. There is subsegmental atelectasis just above the lateral costophrenic gutter. The cardiac silhouette is enlarged. The central pulmonary vascularity is prominent. The pulmonary interstitial markings remain mildly  increased. The trachea is midline. The mediastinum is normal in width. The observed bony thorax is unremarkable. IMPRESSION: Cardiomegaly, mild pulmonary interstitial prominence, and subsegmental atelectasis in the left lower lobe. Allowing for slight differences in positioning and inflation there has not been significant interval change in the appearance of the chest since yesterday's study. Electronically Signed   By: David  [name] M.D.   On: 07/24/2015 07:06   Dg Chest Portable 1 View  07/23/2015  CLINICAL DATA:  60 year old male with a history of chest pain and shortness of breath. EXAM: PORTABLE CHEST 1 VIEW COMPARISON:  Multiple prior plain film, 07/19/2015, 07/09/2015, 07/08/2015, 07/06/2015 FINDINGS: Cardiomediastinal silhouette unchanged in size and contour. Similar positioning of the chest with apical lordotic position. Diffuse coarsening of interstitial markings with low lung volumes. No confluent airspace disease. No pneumothorax or pleural effusion. IMPRESSION: Similar appearance of the chest x-ray with chronic lung changes, cardiomegaly, and no evidence of superimposed acute cardiopulmonary disease. Signed, Dulcy Fanny. Earleen Newport, DO [facility] Electronically Signed   By: Corrie Mckusick D.O.   On: 07/23/2015 18:08   SIGNIFICANT EVENTS  07/05/15 through 07/09/15 - admitted with AoC hypoxic respiratory failure in the setting of decompensated heart failure and pulmonary edema. 07/19/15 - OP visit with pulmonary.  Had been doing well with autotitration CPAP. 07/23/15 - admitted with 7lb weight gain, volume overload, SOB.  STUDIES:  CXR 01/03  chronic changes without  acute process.   DISCUSSION:  60 y/o M with DIP / ILD, chronic hypoxic respiratory failure (6L chronically), brought to [facility] ED 01/03 with SOB and weight gain.   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 Acute on chronic hypoxemic respiratory  failure - due to above; on 6L O2 chronically.  Suspect his acute episode is due to volume overload from decompensated dCHF. OSA - recently started CPAP after last admission; previously did not use due to incorrect fit and claustrophobia. Ongoing tobacco use disorder. Prior notes mention possible baseline COPD however PFT's from 2016 do not support (ratio 81% (107% pred), FEV1 3.28).  Plan: Continue supplemental O2 to maintain SpO2 > 90%. Lasix 40mg  BID starting tomorrow AM 01/014 (note, as outpatient he alternates 40 and 80 daily). Continue nocturnal CPAP Intermittent CXR Tobacco cessation counseling. Continue outpatient BD's / ICS's. Defer abx for now, PCT reassuring Defer systemic steroids   Chest pain - troponin and EKG negative. AoC dCHF with concern for possible decompensation - Echo from 07/05/15 with EF 60 - 65%, grade 1 DD. Hx HLD, HTN, CAD.  Plan: Continue outpatient cardiac regimen - atorvastatin, clopidogrel, diltiazem, imdur, valsartain Defer repeat ECHO for now  GERD. Obesity.  Plan: Continue outpatient PPI / H2 blocker.  AoCKD. Mild hyponatremia - suspect due to volume overload.  Plan: Diuresis as ordered. BMP in AM.  DM - missed dose of lantus on 1/3, elevated CBG  Plan: Continue outpatient lantus, glipizide. Add SSI, HS & Meal coverage  Hx anxiety / depression.  Plan: Continue outpatient alprazolam, citalopram, gabapentin.   Best Practice: Code Status:  Full. Diet: Heart Healthy / Carb Mod. GI prophylaxis:  PPI. VTE prophylaxis:  SCD's / heparin.   Transfer to medical floor 1/4.   Noe Gens, NP-C [place] [facility] Pgr: [phone] or if no answer [phone] 07/24/2015, 8:46 AM

## 2015-07-24 NOTE — Progress Notes (Signed)
Patient placed on CPAP HS. Patient placed on EPAP 12. 6L O2 bleed in. Patient tolerating well. RT will continue to monitor as needed.

## 2015-07-24 NOTE — Progress Notes (Signed)
Pt transferred to 3 [address] bed 4 per bed with all belongings per NT and [place] went with pt to next room. Pt will remain on Tele.

## 2015-07-24 NOTE — Progress Notes (Signed)
[facility]  Patient Status: Active (receiving services up to time of hospitalization)  [facility] is providing the following services: RN  If patient discharges after hours, please call [phone].   Janae Sauce 07/24/2015, 10:22 AM

## 2015-07-24 NOTE — Progress Notes (Signed)
Called report to 3 [address] Pt will transfer per bed to [place] room 4. Report called to [facility] RN

## 2015-07-25 ENCOUNTER — Inpatient Hospital Stay (HOSPITAL_COMMUNITY): Payer: Medicaid Other

## 2015-07-25 ENCOUNTER — Ambulatory Visit (HOSPITAL_BASED_OUTPATIENT_CLINIC_OR_DEPARTMENT_OTHER): Payer: Medicaid Other | Attending: Pulmonary Disease

## 2015-07-25 DIAGNOSIS — J9600 Acute respiratory failure, unspecified whether with hypoxia or hypercapnia: Secondary | ICD-10-CM | POA: Insufficient documentation

## 2015-07-25 LAB — CBC
HCT: 35.7 % — ABNORMAL LOW (ref 39.0–52.0)
HEMOGLOBIN: 11.6 g/dL — AB (ref 13.0–17.0)
MCH: 28.4 pg (ref 26.0–34.0)
MCHC: 32.5 g/dL (ref 30.0–36.0)
MCV: 87.5 fL (ref 78.0–100.0)
Platelets: 177 10*3/uL (ref 150–400)
RBC: 4.08 MIL/uL — AB (ref 4.22–5.81)
RDW: 14.8 % (ref 11.5–15.5)
WBC: 11.5 10*3/uL — AB (ref 4.0–10.5)

## 2015-07-25 LAB — GLUCOSE, CAPILLARY
GLUCOSE-CAPILLARY: 231 mg/dL — AB (ref 65–99)
Glucose-Capillary: 253 mg/dL — ABNORMAL HIGH (ref 65–99)
Glucose-Capillary: 280 mg/dL — ABNORMAL HIGH (ref 65–99)
Glucose-Capillary: 379 mg/dL — ABNORMAL HIGH (ref 65–99)

## 2015-07-25 LAB — BASIC METABOLIC PANEL
ANION GAP: 10 (ref 5–15)
BUN: 32 mg/dL — ABNORMAL HIGH (ref 6–20)
CHLORIDE: 97 mmol/L — AB (ref 101–111)
CO2: 29 mmol/L (ref 22–32)
Calcium: 9.4 mg/dL (ref 8.9–10.3)
Creatinine, Ser: 1.55 mg/dL — ABNORMAL HIGH (ref 0.61–1.24)
GFR calc non Af Amer: 47 mL/min — ABNORMAL LOW (ref >60)
GFR, EST AFRICAN AMERICAN: 54 mL/min — AB (ref >60)
Glucose, Bld: 284 mg/dL — ABNORMAL HIGH (ref 65–99)
POTASSIUM: 4.2 mmol/L (ref 3.5–5.1)
SODIUM: 136 mmol/L (ref 135–145)

## 2015-07-25 MED ORDER — INSULIN GLARGINE 100 UNIT/ML ~~LOC~~ SOLN
40.0000 [IU] | Freq: Every day | SUBCUTANEOUS | Status: DC
  Administered 2015-07-26: 40 [IU] via SUBCUTANEOUS
  Filled 2015-07-25: qty 0.4

## 2015-07-25 NOTE — Progress Notes (Signed)
Inpatient Diabetes Program Recommendations  AACE/ADA: New Consensus Statement on Inpatient Glycemic Control (2015)  Target Ranges:  Prepandial:   less than 140 mg/dL      Peak postprandial:   less than 180 mg/dL (1-2 hours)      Critically ill patients:  140 - 180 mg/dL  Results for OMERO, GOBBLE (MRN [id number]) as of 07/25/2015 10:29  Ref. Range 07/24/2015 07:13 07/24/2015 11:31 07/24/2015 16:40 07/24/2015 20:43 07/25/2015 06:04  Glucose-Capillary Latest Ref Range: 65-99 mg/dL 436 (H) 378 (H) 244 (H) 346 (H) 253 (H)   Review of Glycemic Control  Current orders for Inpatient glycemic control:  Lantus 35 units daily, Novolog 0-20 units TID with meals, Novolog 0-5 units HS, Novolog 4 units TID with meals for meal coverage, Glipizide 10 mg BID   Inpatient Diabetes Program Recommendations: Insulin - Basal: Patient received Lantus 35 units yesterday and fasting glucose is 253 m/gdl. Please consider increasing Lantus to 40 units daily. Insulin - Meal Coverage: Please consider increasing meal coverage to Novolog 6 units TID with meals.  Thanks, Barnie Alderman, RN, MSN, CDE Diabetes Coordinator Inpatient Diabetes Program [phone] (Team Pager from [place] to [place]) [phone] ([facility] office) [phone] [facility] office) [phone] [facility] office)

## 2015-07-25 NOTE — Progress Notes (Signed)
Pt placed on Cpap with 3 lpm bled in oxygen.  Tolerating well no issues to report.

## 2015-07-25 NOTE — Progress Notes (Signed)
Utilization review completed. Rondell Frick, RN, BSN. 

## 2015-07-25 NOTE — Progress Notes (Signed)
Name: [name] MRN: [id number] DOB: Sep 25, 1955    ADMISSION DATE:  07/23/2015 CONSULTATION DATE:  07/23/15  REFERRING MD :  EDP  CHIEF COMPLAINT:  SOB  SUBJECTIVE: Reports feeling better and ready to go home. SOB significantly improved. Ambulated and took a shower without increased need for O2. He is now on [place]. Denies chest pain, cough but reports mild wheezing  VITAL SIGNS: Temp:  [97.7 F (36.5 C)-98.5 F (36.9 C)] 98.2 F (36.8 C) (01/05 1135) Pulse Rate:  [67-75] 71 (01/05 1135) Resp:  [16-18] 18 (01/05 1135) BP: (102-125)/(56-71) 102/56 mmHg (01/05 1135) SpO2:  [91 %-98 %] 94 % (01/05 1135) Weight:  [117.754 kg (259 lb 9.6 oz)] 117.754 kg (259 lb 9.6 oz) (01/05 0606)  PHYSICAL EXAMINATION: General: Adult male, resting in bed, in NAD. Neuro: A&O x 4, moves all extremities  HEENT: [place]/AT. PERRL, sclerae anicteric, oral mucosa moist Cardiovascular: RRR, no M/R/G.  Lungs: Respirations even and unlabored; lungs clear but diminished bilaterally in the bases, no wheezing, rhonchi or crackles.    Abdomen: Obese, BS x 4, soft, NT/ND.  Musculoskeletal: No gross deformities, 2+ edema bilaterally. Skin: Intact, warm, no rashes.     Recent Labs Lab 07/23/15 1803 07/24/15 0001 07/25/15 0334  NA 134* 133* 136  K 4.0 4.9 4.2  CL 97* 97* 97*  CO2 25 22 29   BUN 18 20 32*  CREATININE 1.90* 1.73* 1.55*  GLUCOSE 380* 520* 284*    Recent Labs Lab 07/23/15 1803 07/24/15 0001 07/25/15 0334  HGB 12.6* 12.8* 11.6*  HCT 37.9* 38.5* 35.7*  WBC 7.1 8.4 11.5*  PLT 150 169 177   Dg Chest Port 1 View  07/25/2015  CLINICAL DATA:  Congestive heart failure. EXAM: PORTABLE CHEST 1 VIEW COMPARISON:  07/24/2015. FINDINGS: Mediastinum hilar structures are normal. Cardiomegaly with interim resolution of pulmonary venous congestion and interstitial edema. No pleural effusion or pneumothorax. Low lung volumes. No acute osseous abnormality. IMPRESSION: Interim clearing of congestive heart  failure . Electronically Signed   By: Marcello Moores  Register   On: 07/25/2015 07:31   Dg Chest Port 1 View  07/24/2015  CLINICAL DATA:  Respiratory failure, shortness of breath EXAM: PORTABLE CHEST 1 VIEW COMPARISON:  Portable chest x-ray of July 23, 2015 FINDINGS: The lungs are adequately inflated. There is subsegmental atelectasis just above the lateral costophrenic gutter. The cardiac silhouette is enlarged. The central pulmonary vascularity is prominent. The pulmonary interstitial markings remain mildly increased. The trachea is midline. The mediastinum is normal in width. The observed bony thorax is unremarkable. IMPRESSION: Cardiomegaly, mild pulmonary interstitial prominence, and subsegmental atelectasis in the left lower lobe. Allowing for slight differences in positioning and inflation there has not been significant interval change in the appearance of the chest since yesterday's study. Electronically Signed   By: David  [name] M.D.   On: 07/24/2015 07:06   Dg Chest Portable 1 View  07/23/2015  CLINICAL DATA:  60 year old male with a history of chest pain and shortness of breath. EXAM: PORTABLE CHEST 1 VIEW COMPARISON:  Multiple prior plain film, 07/19/2015, 07/09/2015, 07/08/2015, 07/06/2015 FINDINGS: Cardiomediastinal silhouette unchanged in size and contour. Similar positioning of the chest with apical lordotic position. Diffuse coarsening of interstitial markings with low lung volumes. No confluent airspace disease. No pneumothorax or pleural effusion. IMPRESSION: Similar appearance of the chest x-ray with chronic lung changes, cardiomegaly, and no evidence of superimposed acute cardiopulmonary disease. Signed, Dulcy Fanny. Earleen Newport, DO [facility] Electronically Signed  By: Corrie Mckusick D.O.   On: 07/23/2015 18:08   SIGNIFICANT EVENTS  07/05/15 through 07/09/15 - admitted with AoC hypoxic respiratory failure in the setting of decompensated heart  failure and pulmonary edema. 07/19/15 - OP visit with pulmonary.  Had been doing well with autotitration CPAP. 07/23/15 - admitted with 7lb weight gain, volume overload, SOB.  STUDIES:  CXR 01/04  chronic changes without acute process; improved pulmonary congestion   DISCUSSION:  60 y/o M with DIP / ILD, chronic hypoxic respiratory failure (6L chronically), brought to [facility] ED 01/03 with SOB and weight gain.   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 Acute on chronic hypoxemic respiratory failure - due to above; on 6L O2 chronically.  Suspect his acute episode is due to volume overload from decompensated dCHF OSA - recently started CPAP after last admission; previously did not use due to incorrect fit and claustrophobia. Ongoing tobacco use disorder. Prior notes mention possible baseline COPD however PFT's from 2016 do not support (ratio 81% (107% pred), FEV1 3.28).  Plan: Continue supplemental O2 [place] to maintain SpO2 > 90%. Continue lasix 40mg  PO BID and plan on changing back to home dose of 40 mg alternating with 80 mg at discharge  Continue nocturnal CPAP CXR prn Tobacco cessation counseling. Continue Proventil and prn duoneb Defer abx for now, PCT reassuring Defer systemic steroids   Chest pain - troponin and EKG negative. AoC dCHF with concern for possible decompensation - Echo from 07/05/15 with EF 60 - 65%, grade 1 DD. Hx HLD, HTN, CAD.  Plan: Continue outpatient cardiac regimen - atorvastatin, clopidogrel, diltiazem, imdur, valsartain Defer repeat ECHO for now F/U cardiology as outpatient  Infectious disease Mild increase in WBC; afebrile  Plan  Monitor wbc Culture if febrile  No need for antibiotics  GERD. Obesity.  Plan: Continue outpatient PPI / H2 blocker.  AoCKD-creatinine trending down Mild hyponatremia - resolved Plan: Diuresis as ordered. BMP daily  DM - missed dose of lantus on 1/3, elevated  CBG  Plan: Diabetes education following; appreciate recs Increase lantus to 40 units daily Continue glipizide. Add SSI, HS & Meal coverage  Hx anxiety / depression.  Plan: Continue outpatient alprazolam, citalopram, gabapentin.  Family update: No family at bedside; patient updated on current treatment plan.   [facility] ANP-BC Pulmonary and Critical Care Medicine [facility] [place] Pager: [phone] 07/25/2015, 2:58 PM

## 2015-07-26 ENCOUNTER — Ambulatory Visit: Payer: Medicaid Other | Admitting: Cardiology

## 2015-07-26 ENCOUNTER — Inpatient Hospital Stay (HOSPITAL_COMMUNITY): Payer: Medicaid Other

## 2015-07-26 DIAGNOSIS — J9601 Acute respiratory failure with hypoxia: Secondary | ICD-10-CM

## 2015-07-26 DIAGNOSIS — J9602 Acute respiratory failure with hypercapnia: Secondary | ICD-10-CM

## 2015-07-26 LAB — BASIC METABOLIC PANEL
ANION GAP: 11 (ref 5–15)
BUN: 30 mg/dL — ABNORMAL HIGH (ref 6–20)
CALCIUM: 9 mg/dL (ref 8.9–10.3)
CO2: 27 mmol/L (ref 22–32)
CREATININE: 1.68 mg/dL — AB (ref 0.61–1.24)
Chloride: 96 mmol/L — ABNORMAL LOW (ref 101–111)
GFR calc Af Amer: 49 mL/min — ABNORMAL LOW (ref >60)
GFR calc non Af Amer: 43 mL/min — ABNORMAL LOW (ref >60)
Glucose, Bld: 223 mg/dL — ABNORMAL HIGH (ref 65–99)
Potassium: 5.3 mmol/L — ABNORMAL HIGH (ref 3.5–5.1)
Sodium: 134 mmol/L — ABNORMAL LOW (ref 135–145)

## 2015-07-26 LAB — CBC
HEMATOCRIT: 40.3 % (ref 39.0–52.0)
Hemoglobin: 13.1 g/dL (ref 13.0–17.0)
MCH: 28.1 pg (ref 26.0–34.0)
MCHC: 32.5 g/dL (ref 30.0–36.0)
MCV: 86.5 fL (ref 78.0–100.0)
Platelets: 185 10*3/uL (ref 150–400)
RBC: 4.66 MIL/uL (ref 4.22–5.81)
RDW: 14.7 % (ref 11.5–15.5)
WBC: 8.5 10*3/uL (ref 4.0–10.5)

## 2015-07-26 LAB — GLUCOSE, CAPILLARY
GLUCOSE-CAPILLARY: 207 mg/dL — AB (ref 65–99)
Glucose-Capillary: 297 mg/dL — ABNORMAL HIGH (ref 65–99)

## 2015-07-26 LAB — MAGNESIUM: MAGNESIUM: 2 mg/dL (ref 1.7–2.4)

## 2015-07-26 MED ORDER — FUROSEMIDE 40 MG PO TABS: 40.0000 mg | ORAL_TABLET | Freq: Two times a day (BID) | ORAL | Status: DC

## 2015-07-26 MED ORDER — INSULIN GLARGINE 100 UNIT/ML ~~LOC~~ SOLN: 40.0000 [IU] | Freq: Every day | SUBCUTANEOUS | Status: DC

## 2015-07-26 NOTE — Procedures (Signed)
Pt placed on BiPAP with automode setting.  Pt is tolerating well and resting comfortably.

## 2015-07-26 NOTE — Discharge Instructions (Signed)
1.  Review your medications carefully as they have changed 2.  Please call to make an appointment with your primary care provider to review your blood glucose.  Record your daily glucose checks and take with you to your next appointment.  3.  Follow a low sodium, carbohydrate modified diet 4. Please take note of your scheduled follow up appointment in the pulmonary office.  If you can not keep your appointment, please call to reschedule. 5.  Weigh yourself daily at the same time and record.  See instructions regarding weight gain and when to call MD.

## 2015-07-26 NOTE — Progress Notes (Signed)
Patient is arranged with [place] for [facility] as prior to admission. Mindi Slicker [facility] [phone]

## 2015-07-26 NOTE — Discharge Summary (Signed)
Physician Discharge Summary  Patient ID: [name] MRN: [id number] DOB/AGE: 61-Jul-1957 61 y.o.  Admit date: 07/23/2015 Discharge date: 07/26/2015    Discharge Diagnoses:  Acute on Chronic Respiratory Failure ILD  Questionable Hx of COPD OSA  Acute on Chronic Diastolic CHF HTN HLD CAD GERD AKI  CKD III Overactive Bladder  DM II                                                                        DISCHARGE PLAN BY DIAGNOSIS     Acute on Chronic Respiratory Failure ILD  Questionable Hx of COPD OSA   Discharge Plan: Oxygen to keep saturations > 90% Continue Symbicort  Follow up in pulmonary office  Continue CPAP QHS   Acute on Chronic Diastolic CHF HTN HLD CAD  Discharge Plan: Continue lipitor, plavix, cardizem & avapro Adjust lasix to 40 mg BID  Home health RN ordered to assist with medications, daily weights, complex medical management   GERD  Discharge Plan: Continue protonix, pepcid   AKI  CKD III Overactive Bladder   Discharge Plan: Follow up BMP with PCP Continue Enablex   DM II   Discharge Plan: Resume glucotrol  Continue lantus, increase home dose to 40 units QD  Anxiety  Depression   Discharge Plan: Continue alprazolam, citalopram, gabapentin                DISCHARGE SUMMARY   [name] is a 60 y.o. y/o male with a PMH of 60 y.o. male with a PMH of HTN, HLD, CAD, CVA, DM II, Cocaine abuse, GERD, OSA on CPAP (never used previously due to claustrophobia and incorrect fit), depression / anxiety, suspected COPD (no PFT's), chronic hypoxic respiratory failure on 6L 24/7 and DIP with Langerhan's cell histiocytosis ILD due to ongoing smoking who presented to [facility] ED 07/23/15 due to SOB and reported 7lb weight gain in the past week.Home health RN checked on him that day and noticed his weight increase which prompted her to call cardiologist who recommended that he come to ED for further evaluation.  He had recent admission 07/05/15  through 07/09/15 for AoC hypoxic respiratory failure in the setting of decompensated heart failure and pulmonary edema. Following discharge, he had follow up in our office 07/19/15. He had been using CPAP with autotitration since discharge and reported that it had helped significantly.  On admit, he reported SOB with wheezing that has been worse than baseline for one week, mild chest pain as well as increase in LE edema.  CXR in ED did not show any acute process. He did have increased WOB on initial presentation for which he was placed on BiPAP. He was admitted for Acute on chronic hypoxic respiratory failure, decompensated dCHF with volume overload, and SOB presumed due to decompensated dCHF.  Of note, he feels he over did it over the Christmas / [place] holiday.  The patient was aggressively diuresed 9 liters of fluid during admission.  He made quick improvement with diuresis & BiPAP.  The patient was transitioned back to [place] O2 prior to discharge.  Hospital course complicated by hyperglycemia.  Glucose control achieved with SSI and lantus.  The patient was medically cleared for discharge 1/6 with plans  as above.            Discharge Exam: General: pleasant HEENT: no sinus tenderness Cardiac: regular Chest: b/l crackles at bases Abd: soft, non tender Ext: no edema Neuro: normal strength Skin: no rashes  Filed Vitals:   07/25/15 2048 07/25/15 2105 07/26/15 0544 07/26/15 1141  BP:  118/59 114/69 105/62  Pulse:  78 70 82  Temp:  98.6 F (37 C) 98.2 F (36.8 C) 98.5 F (36.9 C)  TempSrc:  Oral Oral Oral  Resp:  '17 18 18  '$ Height:      Weight:   254 lb 6.4 oz (115.395 kg)   SpO2: 93% 93% 96% 93%     Discharge Labs  BMET  Recent Labs Lab 07/23/15 1803 07/24/15 0001 07/25/15 0334 07/26/15 0320  NA 134* 133* 136 134*  K 4.0 4.9 4.2 5.3*  CL 97* 97* 97* 96*  CO2 '25 22 29 27  '$ GLUCOSE 380* 520* 284* 223*  BUN 18 20 32* 30*  CREATININE 1.90* 1.73* 1.55* 1.68*  CALCIUM 9.4  9.2 9.4 9.0  MG  --  1.7  --  2.0  PHOS  --  3.6  --   --     CBC  Recent Labs Lab 07/24/15 0001 07/25/15 0334 07/26/15 0320  HGB 12.8* 11.6* 13.1  HCT 38.5* 35.7* 40.3  WBC 8.4 11.5* 8.5  PLT 169 177 185     Discharge Instructions    (HEART FAILURE PATIENTS) Call MD:  Anytime you have any of the following symptoms: 1) 3 pound weight gain in 24 hours or 5 pounds in 1 week 2) shortness of breath, with or without a dry hacking cough 3) swelling in the hands, feet or stomach 4) if you have to sleep on extra pillows at night in order to breathe.    Complete by:  As directed      Call MD for:  difficulty breathing, headache or visual disturbances    Complete by:  As directed      Call MD for:  extreme fatigue    Complete by:  As directed      Call MD for:  hives    Complete by:  As directed      Call MD for:  persistant dizziness or light-headedness    Complete by:  As directed      Call MD for:  persistant nausea and vomiting    Complete by:  As directed      Call MD for:  severe uncontrolled pain    Complete by:  As directed      Call MD for:  temperature >100.4    Complete by:  As directed      Diet - low sodium heart healthy    Complete by:  As directed      Diet Carb Modified    Complete by:  As directed      Discharge instructions    Complete by:  As directed   1.  Review your medications carefully as they have changed 2.  Please call to make an appointment with your primary care provider to review your blood glucose.  Record your daily glucose checks and take with you to your next appointment.  3.  Follow a low sodium, carbohydrate modified diet 4. Please take note of your scheduled follow up appointment in the pulmonary office.  If you can not keep your appointment, please call to reschedule. 5.  Weigh yourself daily at the same time  and record.  See instructions regarding weight gain and when to call MD.     Increase activity slowly    Complete by:  As directed                  Follow-up Information    Follow up with [name], [name], MD. Schedule an appointment as soon as possible for a visit on 07/30/2015.   Specialty:  Family Medicine   Why:  @ 11;15am   Contact information:   [place] [address] [phone]       Follow up with [facility], NP On 08/09/2015.   Specialty:  Pulmonary Disease   Why:  Appt at 11:45 for hospital follow up   Contact information:   [address] [address] [phone]       Follow up with [facility].   Why:  a home health nurse will continue to come to your home   Contact information:   [place] [address] [phone]          Medication List    TAKE these medications        albuterol 108 (90 Base) MCG/ACT inhaler  Commonly known as:  PROVENTIL HFA;VENTOLIN HFA  Inhale 2 puffs into the lungs every 6 (six) hours as needed for wheezing or shortness of breath.     albuterol (2.5 MG/3ML) 0.083% nebulizer solution  Commonly known as:  PROVENTIL  Take 3 mLs (2.5 mg total) by nebulization every 3 (three) hours as needed for wheezing or shortness of breath.     alfuzosin 10 MG 24 hr tablet  Commonly known as:  UROXATRAL  Take 10 mg by mouth daily with breakfast.     ALPRAZolam 0.25 MG tablet  Commonly known as:  XANAX  Take 0.25 mg by mouth at bedtime.     atorvastatin 20 MG tablet  Commonly known as:  LIPITOR  Take 20 mg by mouth daily.     budesonide-formoterol 160-4.5 MCG/ACT inhaler  Commonly known as:  SYMBICORT  Inhale 2 puffs into the lungs 2 (two) times daily.     cholecalciferol 1000 units tablet  Commonly known as:  VITAMIN D  Take 2,000 Units by mouth daily.     citalopram 20 MG tablet  Commonly known as:  CELEXA  Take 20 mg by mouth 2 (two) times daily.     clopidogrel 75 MG tablet  Commonly known as:  PLAVIX  Take 1 tablet (75 mg total) by mouth daily.     diltiazem 240 MG 24 hr  capsule  Commonly known as:  CARDIZEM CD  Take 240 mg by mouth daily.     FISH OIL + D3 PO  Take 1 capsule by mouth 4 (four) times daily.     furosemide 40 MG tablet  Commonly known as:  LASIX  Take 1 tablet (40 mg total) by mouth 2 (two) times daily.     gabapentin 300 MG capsule  Commonly known as:  NEURONTIN  Take 300-600 mg by mouth 2 (two) times daily. 1 cap in morning and 2 caps at night.     glipiZIDE 10 MG tablet  Commonly known as:  GLUCOTROL  Take 10 mg by mouth 2 (two) times daily.     insulin glargine 100 UNIT/ML injection  Commonly known as:  LANTUS  Inject 0.4 mLs (40 Units total) into the skin daily.     ipratropium-albuterol 0.5-2.5 (  3) MG/3ML Soln  Commonly known as:  DUONEB  Take 3 mLs by nebulization every 6 (six) hours as needed (Shortness of breath).     isosorbide mononitrate 60 MG 24 hr tablet  Commonly known as:  IMDUR  Take 1 tablet (60 mg total) by mouth daily.     nitroGLYCERIN 0.4 MG SL tablet  Commonly known as:  NITROSTAT  Place 1 tablet (0.4 mg total) under the tongue every 5 (five) minutes as needed for chest pain.     omeprazole 40 MG capsule  Commonly known as:  PRILOSEC  Take 40 mg by mouth daily.     ranitidine 300 MG tablet  Commonly known as:  ZANTAC  Take 300 mg by mouth at bedtime.     solifenacin 5 MG tablet  Commonly known as:  VESICARE  Take 5 mg by mouth daily.     valsartan 40 MG tablet  Commonly known as:  DIOVAN  Take 20 mg by mouth daily.          Disposition:  Home.  Home health RN ordered to assist with medication management for CHF, DM, COPD etc.     Discharged Condition: DAYSHAUN WHOBREY has met maximum benefit of inpatient care and is medically stable and cleared for discharge.  Patient is pending follow up as above.      Time spent on disposition:  Greater than 35 minutes.   Signed: Noe Gens, NP-C [place] [facility] Pgr: [phone] Office: [place], MD [facility]  Pulmonary/Critical Care 07/26/2015, 1:57 PM Pager:  [phone] After 3pm call: [phone]

## 2015-07-26 NOTE — Progress Notes (Signed)
PCCM PROGRESS NOTE  ADMISSION DATE: 07/23/2015  CC: short of breath  SUBJECTIVE: Anxious to go home.  VITALS SIGNS: BP 114/69 mmHg  Pulse 70  Temp(Src) 98.2 F (36.8 C) (Oral)  Resp 18  Ht 6' (1.829 m)  Wt 254 lb 6.4 oz (115.395 kg)  BMI 34.50 kg/m2  SpO2 96%  INTAKE/OUTPUT: I/O last 3 completed shifts: In: 2020 [P.O.:2020] Out: 6800 [Urine:6800]  General: pleasant HEENT: no sinus tenderness Cardiac: regular Chest: b/l crackles at bases Abd: soft, non tender Ext: no edema Neuro: normal strength Skin: no rashes   CMP Latest Ref Rng 07/26/2015 07/25/2015 07/24/2015  Glucose 65 - 99 mg/dL 223(H) 284(H) 520(H)  BUN 6 - 20 mg/dL 30(H) 32(H) 20  Creatinine 0.61 - 1.24 mg/dL 1.68(H) 1.55(H) 1.73(H)  Sodium 135 - 145 mmol/L 134(L) 136 133(L)  Potassium 3.5 - 5.1 mmol/L 5.3(H) 4.2 4.9  Chloride 101 - 111 mmol/L 96(L) 97(L) 97(L)  CO2 22 - 32 mmol/L 27 29 22   Calcium 8.9 - 10.3 mg/dL 9.0 9.4 9.2  Total Protein 6.5 - 8.1 g/dL - - -  Total Bilirubin 0.3 - 1.2 mg/dL - - -  Alkaline Phos 38 - 126 U/L - - -  AST 15 - 41 U/L - - -  ALT 17 - 63 U/L - - -     CBC Latest Ref Rng 07/26/2015 07/25/2015 07/24/2015  WBC 4.0 - 10.5 K/uL 8.5 11.5(H) 8.4  Hemoglobin 13.0 - 17.0 g/dL 13.1 11.6(L) 12.8(L)  Hematocrit 39.0 - 52.0 % 40.3 35.7(L) 38.5(L)  Platelets 150 - 400 K/uL 185 177 169     Dg Chest Port 1 View  07/26/2015  CLINICAL DATA:  Acute and chronic respiratory failure ; history of COPD and renal cell malignancy. EXAM: PORTABLE CHEST 1 VIEW COMPARISON:  Portable chest x-ray of July 25, 2015 FINDINGS: The lungs are well-expanded. There is no focal infiltrate. The interstitial markings are minimally prominent bilaterally. The cardiac silhouette is enlarged. The pulmonary vascularity is not engorged. The mediastinum is normal in width. There is no pleural effusion. The bony thorax exhibits no acute abnormality. IMPRESSION: Minimal interstitial prominence bilaterally. Stable cardiomegaly  without pulmonary vascular congestion. No acute cardiopulmonary abnormality. Electronically Signed   By: David  [name] M.D.   On: 07/26/2015 08:04   Dg Chest Port 1 View  07/25/2015  CLINICAL DATA:  Congestive heart failure. EXAM: PORTABLE CHEST 1 VIEW COMPARISON:  07/24/2015. FINDINGS: Mediastinum hilar structures are normal. Cardiomegaly with interim resolution of pulmonary venous congestion and interstitial edema. No pleural effusion or pneumothorax. Low lung volumes. No acute osseous abnormality. IMPRESSION: Interim clearing of congestive heart failure . Electronically Signed   By: Marcello Moores  Register   On: 07/25/2015 07:31     CULTURES:  STUDIES:  EVENTS: 12/16 to 12/20 - Admit with CHF exacerbation 01/03 Admit  DISCUSSION: 60 yo male smoker with acute on chronic hypoxic respiratory failure 2nd to acute on chronic diastolic CHF with hx of bx proven DIP and Langerhans cell histiocytosis.  ASSESSMENT/PLAN:  Acute on chronic respiratory failure. ILD. ?hx of COPD. Plan: - oxygen to keep SpO2 90 to 95% - f/u CXR intermittently - continue symbicort  Acute on chronic diastolic CHF. Hx of HTN, HLD, CAD. Plan: - diuresis as tolerated - continue lipitor, plavix, cardizem, avapro  Hx of OSA. Plan: - CPAP qhs   Hx of GERD. Plan: - continue protonix, pepcid  AKI. CKD stage 3. Overactive bladder. Plan: - monitor renal fx, urine outpt - continue enablex  DM type II. Plan: - glucotrol with lantus  Hx of anxiety/depression. Plan: - continue alprazolam, citalopram, gabapentin  Okay for d/c home.  Will need f/u with Dr. Chase Caller [facility] within two weeks of d/c.  Chesley Mires, MD [facility] Pulmonary/Critical Care 07/26/2015, 11:26 AM Pager:  [phone] After 3pm call: [phone]

## 2015-07-26 NOTE — Progress Notes (Signed)
Pt has orders to be discharged. Discharge instructions given and pt has no additional questions at this time. Medication regimen reviewed and pt educated. Pt verbalized understanding and has no additional questions. Telemetry box removed. IV removed and site in good condition. Pt stable and waiting for transportation. 

## 2015-07-30 ENCOUNTER — Telehealth: Payer: Self-pay | Admitting: Acute Care

## 2015-07-30 NOTE — Progress Notes (Addendum)
Pt was seen 12.30.16 and was sent down to cxr as stated in AVS Received call from radiology stating that pt's O2 had run out and requesting we bring tank to pt Full e-tank taken down to xray, pt stated he uses 5lpm continuously - saw that pt uses a POC to which he stated his battery had run out.  Pt did not have the cord with him to charge the device, nor did he have a backup device in his car.  Asked pt who his DME company is so that I could ensure he had adequate O2 to get home.  Pt stated he uses [facility].  Advised pt to use our tank while waiting/receiving his xray while I get an [facility] tank for him to take home.  No [facility] tanks on the floor.  Called [facility] liaison, explained situation and she reported she would contact the [place] liaison to bring tank over for pt.  Called back down to radiology and asked them to explain to pt the situation.  Received call back from radiology several minutes later stating that pt refused to stay and wait for the [facility] tank.  Pt stated that he would 'be fine', that he 'would ride home with the windows down' and that he 'does it all the time.'  Pt was aware that O2 was being obtained.  When the call was received from radiology, pt had already left.  Situation explained to Eric Form NP as stated above.

## 2015-08-05 ENCOUNTER — Telehealth: Payer: Self-pay | Admitting: Internal Medicine

## 2015-08-05 NOTE — Telephone Encounter (Signed)
Will route to DOD since we have not gotten a response from MR.  VS - please advise. Thanks.

## 2015-08-05 NOTE — Telephone Encounter (Signed)
Spoke with pt. States that he needs pain medication due to his lung pain. Reports that when he takes deep breath or walks outside in the cold the pain occurs. Coughing, chest tightness and wheezing are also present. Denies SOB or fever. When I asked if he was taking Tylenol or Ibuprofen for the pain he said, " None of that works."  MR - please advise.  Thanks.

## 2015-08-05 NOTE — Telephone Encounter (Signed)
Can send script for prednisone 10 mg pills >> take 2 pills daily.  He needs ROV this week with chest xray.

## 2015-08-05 NOTE — Telephone Encounter (Signed)
atc pt X2 on home phone, received fast busy signal. lmtcb X1 for pt on mobile #.

## 2015-08-06 NOTE — Telephone Encounter (Signed)
Spoke with pt to relay recs, declined prednisone multiple times saying "that never works". Pt already scheduled with TP on Friday at 11:45. Offered to move appt up to as early as today for pt to be seen.  Pt declined, saying he was busy. appt left for Friday at pt's request.  Nothing further needed.

## 2015-08-07 ENCOUNTER — Ambulatory Visit (HOSPITAL_BASED_OUTPATIENT_CLINIC_OR_DEPARTMENT_OTHER): Payer: Medicaid Other

## 2015-08-08 ENCOUNTER — Telehealth: Payer: Self-pay | Admitting: Internal Medicine

## 2015-08-08 NOTE — Telephone Encounter (Signed)
error 

## 2015-08-08 NOTE — Telephone Encounter (Signed)
Spoke with [name]. She is requesting a verbal order for speech therapy. Pt is complaining of difficulty swallowing.  MR - please advise. Thanks.

## 2015-08-09 ENCOUNTER — Ambulatory Visit (INDEPENDENT_AMBULATORY_CARE_PROVIDER_SITE_OTHER): Payer: Medicaid Other | Admitting: Adult Health

## 2015-08-09 ENCOUNTER — Encounter: Payer: Self-pay | Admitting: Adult Health

## 2015-08-09 VITALS — BP 116/62 | HR 91 | Temp 98.6°F | Ht 72.0 in | Wt 273.0 lb

## 2015-08-09 DIAGNOSIS — J849 Interstitial pulmonary disease, unspecified: Secondary | ICD-10-CM | POA: Diagnosis not present

## 2015-08-09 DIAGNOSIS — J9611 Chronic respiratory failure with hypoxia: Secondary | ICD-10-CM | POA: Diagnosis not present

## 2015-08-09 DIAGNOSIS — J449 Chronic obstructive pulmonary disease, unspecified: Secondary | ICD-10-CM

## 2015-08-09 NOTE — Patient Instructions (Signed)
Low salt diet  Leg elevated.  Try to quit smoking  Continue on Symbicort Twice daily  , rinse well after use.  Follow up Dr. Chase Caller 2 month as planned and As needed   Please contact office for sooner follow up if symptoms do not improve or worsen or seek emergency care

## 2015-08-09 NOTE — Assessment & Plan Note (Signed)
Cont on o2 .  

## 2015-08-09 NOTE — Assessment & Plan Note (Signed)
Try to quit smoking  Continue on Symbicort Twice daily  , rinse well after use.  Follow up Dr. Chase Caller 2 month as planned and As needed   Please contact office for sooner follow up if symptoms do not improve or worsen or seek emergency care

## 2015-08-09 NOTE — Telephone Encounter (Signed)
Spoke with [name]. Verbal order has been given to her. Nothing further was needed.

## 2015-08-09 NOTE — Telephone Encounter (Signed)
That is fine 

## 2015-08-09 NOTE — Progress Notes (Signed)
   Subjective:    Patient ID: BLAYTON FARE, male    DOB: 03/30/1956, 60 y.o.   MRN: [id number]  HPI  60yo male smoker with  ILD(DIP)  on Oxygen .  Polysubstance abuse hx.   08/09/2015 [facility] follow up  Patient returns for a post hospital follow-up. Patient was recently admitted with acute on chronic respiratory failure with underlying interstitial lung disease for  acute on chronic diastolic heart failure. He improved with diuresis .  He says he is feeling improved. Has decreased shortness of breath. Unfortunately continues to smoke. We discussed smoking cessation. He denies any hemoptysis, chest pain, orthopnea, PND, or increased leg swelling.    Review of Systems  Constitutional:   No  weight loss, night sweats,  Fevers, chills,  +fatigue, or  lassitude.  HEENT:   No headaches,  Difficulty swallowing,  Tooth/dental problems, or  Sore throat,                No sneezing, itching, ear ache, nasal congestion, post nasal drip,   CV:  No chest pain,  Orthopnea, PND,   anasarca, dizziness, palpitations, syncope.   GI  No heartburn, indigestion, abdominal pain, nausea, vomiting, diarrhea, change in bowel habits, loss of appetite, bloody stools.   Resp:    No chest wall deformity  Skin: no rash or lesions.  GU: no dysuria, change in color of urine, no urgency or frequency.  No flank pain, no hematuria   MS:  No joint pain or swelling.  No decreased range of motion.  No back pain.  Psych:  No change in mood or affect. No depression or anxiety.  No memory loss.         Objective:   Physical Exam  GEN: A/Ox3; pleasant , NAD, obese   HEENT:  [place]/AT,  EACs-clear, TMs-wnl, NOSE-clear, THROAT-clear, no lesions, no postnasal drip or exudate noted.   NECK:  Supple w/ fair ROM; no JVD; normal carotid impulses w/o bruits; no thyromegaly or nodules palpated; no lymphadenopathy.  RESP  BB crackles .no accessory muscle use, no dullness to percussion  CARD:  RRR, no m/r/g  , 1+   peripheral edema, pulses intact, no cyanosis or clubbing.  GI:   Soft & nt; nml bowel sounds; no organomegaly or masses detected.  Musco: Warm bil, no deformities or joint swelling noted.   Neuro: alert, no focal deficits noted.    Skin: Warm, no lesions or rashes       Assessment & Plan:

## 2015-08-15 ENCOUNTER — Telehealth: Payer: Self-pay | Admitting: Internal Medicine

## 2015-08-15 NOTE — Telephone Encounter (Signed)
Spoke with [facility], requests an order to continue care with patient for 2-3 more weeks. Otila Kluver states that the patient is retaining fluid with Prednisone and has gained 4 lbs in the past 4 days. Pt also has rales heard in lungs. Otila Kluver is hoping to prevent hospital visit. Please advise Dr Chase Caller. Thanks.

## 2015-08-16 NOTE — Telephone Encounter (Signed)
Double up lasix to 80mg  bid - but will need bmet check next week

## 2015-08-16 NOTE — Telephone Encounter (Signed)
What is the dose of prednisone?  What is the dose of current lasix?  Is he a patient in CHF clinic?

## 2015-08-16 NOTE — Telephone Encounter (Signed)
Called spoke with [name] and made her aware of recs. She will have BMET checked next week. Nothing further needed

## 2015-08-16 NOTE — Telephone Encounter (Signed)
Called spoke with Otila Kluver. She reports pt is scheduled to see a cardiology next week. He takes pred 10 mg QD and lasix 40 mg BID.

## 2015-08-28 ENCOUNTER — Ambulatory Visit (INDEPENDENT_AMBULATORY_CARE_PROVIDER_SITE_OTHER): Payer: Medicaid Other | Admitting: Cardiology

## 2015-08-28 ENCOUNTER — Encounter: Payer: Self-pay | Admitting: Cardiology

## 2015-08-28 VITALS — BP 110/82 | HR 95 | Ht 72.0 in | Wt 270.0 lb

## 2015-08-28 DIAGNOSIS — I25119 Atherosclerotic heart disease of native coronary artery with unspecified angina pectoris: Secondary | ICD-10-CM | POA: Diagnosis not present

## 2015-08-28 DIAGNOSIS — E8779 Other fluid overload: Secondary | ICD-10-CM | POA: Diagnosis not present

## 2015-08-28 DIAGNOSIS — N183 Chronic kidney disease, stage 3 unspecified: Secondary | ICD-10-CM

## 2015-08-28 DIAGNOSIS — R0600 Dyspnea, unspecified: Secondary | ICD-10-CM | POA: Diagnosis not present

## 2015-08-28 DIAGNOSIS — Z720 Tobacco use: Secondary | ICD-10-CM

## 2015-08-28 DIAGNOSIS — I10 Essential (primary) hypertension: Secondary | ICD-10-CM | POA: Diagnosis not present

## 2015-08-28 NOTE — Progress Notes (Signed)
Cardiology Office Note  Date: 08/28/2015   ID: [name], [name] April 03, 1956, MRN [id number]  PCP: [name], Kevin G, MD  Primary Cardiologist: [name], MD   Chief Complaint  Patient presents with  . Coronary Artery Disease    History of Present Illness: [name] is a medically complex 60 y.o. male last seen in September 2016. He comes in today for a follow-up cardiac visit. He tells me that he has not been feeling well, more short of breath, also feeling of being bloated. His weight is up 8 pounds from the last time I saw him.  He continues to follow closely in the Pulmonary division. I reviewed the recent notes.  He has been treated with recent course of prednisone and reportedly gained weight with evidence of fluid overload. This resulted in a doubling of his Lasix dose by Dr. Chase Caller.  [name] has normal LVEF at 60-65% by recent echocardiogram, but does have some degree of diastolic dysfunction. It is reported that his RV function was normal.  I reviewed his medications which are outlined below. He states that he has been on Lasix 80 mg twice daily over the last few weeks. His weight is down a few pounds. He otherwise continues on Diovan, Imdur, Cardizem CD, Plavix, and Lipitor.  He is still using supplemental oxygen, NYHA class 3 dyspnea with most activities.  Also reports intermittent angina symptoms.  Past Medical History  Diagnosis Date  . Essential hypertension, benign   . History of stroke     Right MCA distribution, residual left-sided weakness  . Noncompliance   . COPD (chronic obstructive pulmonary disease) ([place])   . Hyperlipidemia   . History of pneumonia   . Type 2 diabetes mellitus ([place]) 2011  . GERD (gastroesophageal reflux disease)   . Cocaine abuse   . Cholecystitis, acute 12/20/2013    Lap chole 6/5  . Coronary atherosclerosis of native coronary artery     a. 03/09/2013 Cath/PCI: LM nl, LAD: 50p, 75m (2.5x16 promus DES), LCX nl, OM1 min  irregs, LPL/LPDA diff dzs, RCA nondom, mod diff dzs, EF 55%.  . Depression   . Anxiety   . History of nephrolithiasis   . Arthritis   . Hepatitis Late 1970s  . Sleep apnea     On CPAP, 4L O2 no cpap at home yet  . Renal cell carcinoma [facility])     Status post radical right nephrectomy August 2015  . Headache     Past Surgical History  Procedure Laterality Date  . Appendectomy  1970's  . Cholecystectomy N/A 12/22/2013    Procedure: LAPAROSCOPIC CHOLECYSTECTOMY ;  Surgeon: Kevin Bowie, MD;  Location: [place];  Service: General;  Laterality: N/A;  . Robot assisted laparoscopic nephrectomy Right 02/21/2014    Procedure: ROBOTIC ASSISTED LAPAROSCOPIC RIGHT NEPHRECTOMY ;  Surgeon: Kevin Frock, MD;  Location: [facility];  Service: Urology;  Laterality: Right;  . Laparoscopic lysis of adhesions  02/21/2014    Procedure: LAPAROSCOPIC LYSIS OF ADHESIONS EXTINSIVE;  Surgeon: Kevin Frock, MD;  Location: [facility];  Service: Urology;;  . Video assisted thoracoscopy Left 05/18/2014    Procedure: LEFT VIDEO ASSISTED THORACOSCOPY;  Surgeon: Kevin Nakayama, MD;  Location: [place];  Service: Thoracic;  Laterality: Left;  . Lung biopsy Left 05/18/2014    Procedure: LUNG BIOPSY left upper lobe & left lower lobe;  Surgeon: Kevin Nakayama, MD;  Location: [place];  Service: Thoracic;  Laterality: Left;  . Video  bronchoscopy  05/18/2014    Procedure: VIDEO BRONCHOSCOPY;  Surgeon: Kevin Nakayama, MD;  Location: [place];  Service: Thoracic;;  . Left heart catheterization with coronary angiogram N/A 06/02/2012    Procedure: LEFT HEART CATHETERIZATION WITH CORONARY ANGIOGRAM;  Surgeon: Kevin Bow, MD;  Location: [facility] CATH LAB;  Service: Cardiovascular;  Laterality: N/A;  . Left heart catheterization with coronary angiogram N/A 03/09/2013    Procedure: LEFT HEART CATHETERIZATION WITH CORONARY ANGIOGRAM;  Surgeon: Kevin Mocha, MD;  Location: [facility] CATH LAB;  Service: Cardiovascular;  Laterality: N/A;  .  Left heart catheterization with coronary angiogram N/A 12/20/2013    Procedure: LEFT HEART CATHETERIZATION WITH CORONARY ANGIOGRAM;  Surgeon: Kevin Blanks, MD;  Location: [facility] CATH LAB;  Service: Cardiovascular;  Laterality: N/A;  . Endarterectomy Right 10/08/2014    Procedure: Right ENDARTERECTOMY CAROTID;  Surgeon: Kevin Posner, MD;  Location: [place];  Service: Vascular;  Laterality: Right;  . Patch angioplasty Right 10/08/2014    Procedure: PATCH ANGIOPLASTY Right Carotid;  Surgeon: Kevin Posner, MD;  Location: [facility] OR;  Service: Vascular;  Laterality: Right;    Current Outpatient Prescriptions  Medication Sig Dispense Refill  . albuterol (PROVENTIL HFA;VENTOLIN HFA) 108 (90 BASE) MCG/ACT inhaler Inhale 2 puffs into the lungs every 6 (six) hours as needed for wheezing or shortness of breath.    [name] albuterol (PROVENTIL) (2.5 MG/3ML) 0.083% nebulizer solution Take 3 mLs (2.5 mg total) by nebulization every 3 (three) hours as needed for wheezing or shortness of breath. 75 mL 12  . alfuzosin (UROXATRAL) 10 MG 24 hr tablet Take 10 mg by mouth daily with breakfast.    . ALPRAZolam (XANAX) 0.25 MG tablet Take 0.25 mg by mouth at bedtime.     [name] atorvastatin (LIPITOR) 20 MG tablet Take 20 mg by mouth daily.    . budesonide-formoterol (SYMBICORT) 160-4.5 MCG/ACT inhaler Inhale 2 puffs into the lungs 2 (two) times daily. 1 Inhaler 3  . cholecalciferol (VITAMIN D) 1000 UNITS tablet Take 2,000 Units by mouth daily.    . citalopram (CELEXA) 10 MG tablet Take 10 mg by mouth daily.    . clopidogrel (PLAVIX) 75 MG tablet Take 1 tablet (75 mg total) by mouth daily. 30 tablet 11  . diltiazem (CARDIZEM CD) 240 MG 24 hr capsule Take 240 mg by mouth daily.    . Fish Oil-Cholecalciferol (FISH OIL + D3 PO) Take 1 capsule by mouth 4 (four) times daily.     [name] FLUoxetine (PROZAC) 20 MG capsule Take 20 mg by mouth daily.    . furosemide (LASIX) 40 MG tablet Take 80 mg by mouth daily.    [name] gabapentin (NEURONTIN) 300 MG  capsule Take 300-600 mg by mouth 2 (two) times daily. 1 cap in morning and 2 caps at night.    . insulin glargine (LANTUS) 100 UNIT/ML injection Inject 0.4 mLs (40 Units total) into the skin daily. 10 mL 11  . ipratropium-albuterol (DUONEB) 0.5-2.5 (3) MG/3ML SOLN Take 3 mLs by nebulization every 6 (six) hours as needed (Shortness of breath).    . isosorbide mononitrate (IMDUR) 60 MG 24 hr tablet Take 1 tablet (60 mg total) by mouth daily. 90 tablet 3  . nitroGLYCERIN (NITROSTAT) 0.4 MG SL tablet Place 1 tablet (0.4 mg total) under the tongue every 5 (five) minutes as needed for chest pain. 25 tablet 3  . NON FORMULARY Insulin: Take as directed    . omeprazole (PRILOSEC) 40 MG capsule Take 40 mg by  mouth daily.    . ranitidine (ZANTAC) 300 MG tablet Take 300 mg by mouth at bedtime.     . solifenacin (VESICARE) 5 MG tablet Take 5 mg by mouth daily.    . valsartan (DIOVAN) 40 MG tablet Take 20 mg by mouth daily.      No current facility-administered medications for this visit.   Allergies:  Oxycodone   Social History: The patient  reports that he has been smoking Cigarettes.  He started smoking about 43 years ago. He has a 20.5 pack-year smoking history. He has never used smokeless tobacco. He reports that he does not drink alcohol or use illicit drugs.   ROS:  Please see the history of present illness. Otherwise, complete review of systems is positive for chronically short of breath, abdominal protuberance and leg swelling.  All other systems are reviewed and negative.   Physical Exam: VS:  BP 110/82 mmHg  Pulse 95  Ht 6' (1.829 m)  Wt 270 lb (122.471 kg)  BMI 36.61 kg/m2  SpO2 90%, BMI Body mass index is 36.61 kg/(m^2).  Wt Readings from Last 3 Encounters:  08/28/15 270 lb (122.471 kg)  08/09/15 273 lb (123.832 kg)  07/26/15 254 lb 6.4 oz (115.395 kg)    Obese, chronically ill-appearing male. Oxygen via nasal cannula. HEENT: Conjunctiva and lids normal, oropharynx clear.  Neck:  Supple, elevated JVP, bilateral carotid bruits, no thyromegaly.  Lungs: Diminished but clear to auscultation, nonlabored breathing at rest.  Cardiac: Regular rate and rhythm, no S3, 2/6 systolic murmur, no pericardial rub.  Abdomen:  Protuberant, bowel sounds present, no guarding or rebound.  Extremities: Bilateral leg edema, distal pulses 2+.  Skin: Warm and dry. Musculoskeletal: No kyphosis. Neuropsychiatric: Alert and oriented 3, affect appropriate.  ECG:  I personally reviewed his recent tracing from 07/23/2015 which showed normal sinus rhythm with left anterior fascicular block and decreased R wave progression.  Recent Labwork: 05/10/2015: Pro B Natriuretic peptide (BNP) 58.0 07/23/2015: ALT 22; AST 27; B Natriuretic Peptide 96.7 07/26/2015: BUN 30*; Creatinine, Ser 1.68*; Hemoglobin 13.1; Magnesium 2.0; Platelets 185; Potassium 5.3*; Sodium 134*     Component Value Date/Time   CHOL 128 12/19/2013 0220   TRIG 152* 12/19/2013 0220   HDL 31* 12/19/2013 0220   CHOLHDL 4.1 12/19/2013 0220   VLDL 30 12/19/2013 0220   LDLCALC 67 12/19/2013 0220    Other Studies Reviewed Today:  Cardiac catheterization 12/20/2013: Hemodynamic Findings: Central aortic pressure: 120/62 Left ventricular pressure: 127/7/15  Angiographic Findings:  Left main: Short segment. No obstructive disease.   Left Anterior Descending Artery: Moderate caliber vessel that courses to the apex. There is 40% proximal stenosis. The mid stented segment is patent without restenosis. The distal vessel has diffuse non-obstructive plaque. The diagonal branches are small in caliber with no obstructive disease.   Circumflex Artery: Large dominant system with large bifurcating first obtuse marginal branch and three small caliber posterolateral branches. The OM branch has diffuse 50% proximal stenosis, no focally obstructive lesions. The three small caliber posterolateral branches all have diffuse moderate to severe stenosis  that is unchanged from last cath.   Right Coronary Artery: Small non-dominant vessel with 40% mid vessel stenosis.   Left Ventricular Angiogram: LVEF=65%  Impression: 1. Double vessel CAD with patent stent mid LAD 2. Moderate non-obstructive disease in the Circumflex and RCA 3. Normal LV systolic function  Echocardiogram 07/05/2015: Study Conclusions  - Left ventricle: The cavity size was normal. Wall thickness was normal. Systolic function was normal.  The estimated ejection fraction was in the range of 60% to 65%. Doppler parameters are consistent with abnormal left ventricular relaxation (grade 1 diastolic dysfunction).  Assessment and Plan:  1. Volume overload, multifactorial. His echocardiogram from within the last 2 months shows normal LVEF with only mild diastolic dysfunction and normal RV contraction.  With advancing chronic lung disease an recurring respiratory failure, I wonder if his RV is starting to be more impacted which would explain his increasing abdominal girth and leg edema. Agree with increase in Lasix. This is having a slow effect but he still does not feel well. We are going to obtain a BMET to follow-up on his renal function and potassium. Next step will be to consider either using metolazone or switching him to [facility].  2. CAD status post previous DES to the LAD in 2014, patent at angiography within the last year and a half and otherwise with moderate nonobstructive disease. He does report intermittent angina, and we will continue medical therapy.  3. Essential hypertension , blood pressure is normal today on medical therapy.  4. CKD stage III, last creatinine was 1.6.  5. COPD and interstitial lung disease with ongoing tobacco abuse, pulmonary status declining. He follows with Dr. Chase Caller.  Current medicines were reviewed with the patient today.   Orders Placed This Encounter  Procedures  . Basic metabolic panel    Disposition: FU with me in  2 months.   Signed, Satira Sark, MD, [facility] 08/28/2015 3:51 PM    [name] [place], [place], [place] [address] Phone: [phone]; Fax: [phone]

## 2015-08-28 NOTE — Patient Instructions (Signed)
Your physician recommends that you continue on your current medications as directed. Please refer to the Current Medication list given to you today. Your physician recommends that you return for lab work in: today to check your BMET. Your physician recommends that you schedule a follow-up appointment in: 2 months.

## 2015-08-29 ENCOUNTER — Telehealth: Payer: Self-pay | Admitting: *Deleted

## 2015-08-29 MED ORDER — METOLAZONE 2.5 MG PO TABS: 2.5000 mg | ORAL_TABLET | Freq: Every day | ORAL | Status: DC

## 2015-08-29 NOTE — Telephone Encounter (Signed)
Patient informed and verbalized understanding of plan. 

## 2015-08-29 NOTE — Telephone Encounter (Signed)
-----   Message from Satira Sark, MD sent at 08/29/2015  9:51 AM EST ----- Reviewed. Renal function is relatively stable. BUN 18 and creatinine 1.8, potassium 3.6. Sodium 130 may also be a function of volume overload. Add Zaroxolyn 2.5 mg to be taken daily for the next 3 days along with his Lasix. Hopefully this will help him to get back toward his prior weight, and could then be used as needed for weight change.

## 2015-09-09 ENCOUNTER — Encounter: Payer: Self-pay | Admitting: Neurology

## 2015-09-17 ENCOUNTER — Telehealth: Payer: Self-pay | Admitting: Internal Medicine

## 2015-09-17 NOTE — Telephone Encounter (Signed)
Patient says that when he eats, the right side of his throat closes up and he chokes on his food. Patients sugars have been 415 - 500; patient says that his blood sugars have been running 500 for the past few weeks now.  Asked patient to check his O2, could hear patient breathing heavy on the phone.  Patient says that he is on 6L continuous flow, but his O2 dropped to 87%.  Advised patient to go to ER immediately.  Patient is on his way to ER.    FYI to Dr. Chase Caller

## 2015-09-17 NOTE — Telephone Encounter (Signed)
Ok thans  And noted

## 2015-10-04 ENCOUNTER — Other Ambulatory Visit: Payer: Self-pay | Admitting: Urology

## 2015-10-04 ENCOUNTER — Ambulatory Visit (HOSPITAL_COMMUNITY)
Admission: RE | Admit: 2015-10-04 | Discharge: 2015-10-04 | Disposition: A | Discharge status: Home / Self Care | Payer: Medicaid Other | Source: Ambulatory Visit | Attending: Urology | Admitting: Urology

## 2015-10-04 DIAGNOSIS — I517 Cardiomegaly: Secondary | ICD-10-CM | POA: Diagnosis not present

## 2015-10-04 DIAGNOSIS — C641 Malignant neoplasm of right kidney, except renal pelvis: Secondary | ICD-10-CM

## 2015-10-14 ENCOUNTER — Ambulatory Visit (INDEPENDENT_AMBULATORY_CARE_PROVIDER_SITE_OTHER): Payer: Medicaid Other | Admitting: Internal Medicine

## 2015-10-14 ENCOUNTER — Encounter: Payer: Self-pay | Admitting: Internal Medicine

## 2015-10-14 VITALS — BP 104/58 | HR 78 | Ht 72.0 in | Wt 256.4 lb

## 2015-10-14 DIAGNOSIS — J849 Interstitial pulmonary disease, unspecified: Secondary | ICD-10-CM

## 2015-10-14 DIAGNOSIS — J9611 Chronic respiratory failure with hypoxia: Secondary | ICD-10-CM

## 2015-10-14 NOTE — Patient Instructions (Addendum)
ICD-9-CM ICD-10-CM   1. Chronic respiratory failure with hypoxia ([name]) 518.83 J96.11    799.02    2. ILD (interstitial lung disease) ([place]) 515 J84.9     All your multiple medical problems and symptoms interfering with your quality of life Things appear stable  PLAN Do ESAS symptom score Check pulse ox on room air  Double check on home if you are on prednisone or not (you might be getting it confused it with prozac) Stop fish oil Continue home o2 3-6L to keep pulse ox > 88% Continue other meds including symbicort Refer home palliative care  Followup 3 months or sooner if needed

## 2015-10-14 NOTE — Progress Notes (Signed)
Subjective:    Patient ID: [name], male    DOB: 1955/10/17, 60 y.o.   MRN: [id number]  HPI   OV 04/10/2015  Chief Complaint  Patient presents with  . Follow-up    Pt states his breathing has worsened since last OV. Pt was admitted to [facility] for CP and syncope. Pt c/o increase in DOE, prod cough with dark brown mucus, chest disfomfort when active.    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 At that point in time spirometry showed a significant decline in forced vital capacity from 4 L at baseline to 2.6 L. He was using 4 L of oxygen at rest and 6 L with exertion at home. However we could not convincingly document hypoxemia with exertion on room air in the office at that time. Therefore to this and I ordered pulmonary function tests and CT scan of the chest. He did have CT scan of the chest in August 2016. There is no report on progression compared to February 2016 but I suspect the findings are similar. While he underwent pulmonary function test he had cough syncope and was admitted to the hospital. Cardiology notes reviewed and the final diagnosis was cough syncope. He he now presents for follow-up. He continues to be miserable with class IV dyspnea. He uses 4-6 liters of oxygen because he subjectively feels the need to do that. He does not monitor his pulse ox at home. He says that he continues to have cough syncope at home and has fallen several times. He has now moved to live with his 97 year old mom. He is socially isolated otherwise. He feels like he needs an extra layer of help at home. There are no other issues  Pulse oximetry on room air at rest and walking desaturation test 185 feet 3 laps on room air: RA rest after 15 min off o2, pulse o98% and lowest pulse ox with forehead probe was 90%. He did get dizzy with  coughing   OV 06/05/15 60 year old man with tobacco abuse and history of interstitial lung disease, desquamative interstitial pneumonitis and burnt out Langerhans' cell histiocytosis, hypoxemic respiratory failure; untreated OSA unable to tolerate CPAP. He probably also has some degree of superimposed COPD.  He presents today with two weeks of increased dyspnea, . He is having paroxysms of cough, can lead to pre-syncope / syncope. Last was 2 days ago. He has LE edema, probable more than his baseline. He is using duoneb bid, symbicort bid, albuterol prn - averages once a day. He complains of dry mouth. He took extra lasix after last OV here with TP, helped his edema but not his dyspnea.     OV 07/05/2015  Chief Complaint  Patient presents with  . Follow-up    Pt states his SOB has worsened since last OV. Pt also c/o increase in nonprod cough, pt states he frequently coughs when eating or drinking. Pt c/o right lateral chest pain when coughing. Pt denies f/c/s.   Admit    60yo male smoker with  ILD(DIP)  on Oxygen .  Polysubstance abuse hx.   08/09/2015 [facility] follow up  Patient returns for a post hospital follow-up. Patient was recently admitted with acute on chronic respiratory failure with underlying interstitial lung disease for  acute on chronic diastolic heart failure. He improved with diuresis .  He says he is feeling  improved. Has decreased shortness of breath. Unfortunately continues to smoke. We discussed smoking cessation. He denies any hemoptysis, chest pain, orthopnea, PND, or increased leg swelling.    OV 10/14/2015  Chief Complaint  Patient presents with  . Follow-up    Still having SOB, coughing and mild wheezing. Pt states that he feels like fluid is coming back especially on left side.    Follow-up interstitial lung disease and chronic hypoxemic respiratory failure associated with volume overload and diastolic dysfunction and coronary artery disease and  multiple medical problems including depression and anxiety and poor social situation  He presents for routine follow-up. During stable health times of November been able to document hypoxemia and him on room air at rest. Also during admissions he has been hypoxemic. Again today on room air 10 minutes his pulse ox was 97%. He insists that he gets very dyspneic when he turns his oxygen off and he needs 5 L of oxygen at all times. He does not check his pulse ox at home so we do not know his true oxygen levels when he is off oxygen. He has class III-for dyspnea. He is on oxygen. He lives with his mom was 41 years old and takes care of him. Overall he feels stable. [place] symptom assessment score documented below and it is obvious that is significant problems are fatigue, depression, anxiety and shortness of breath. He is open to undergo home palliative care program  [facility] Symptom Assessment Numerical Scale 0 is no problem -> 10 worst problem 10/14/2015    No Pain -> Worst pain 0  No Tiredness -> Worset tiredness 10  No Nausea -> Worst nausea 0  No Depression -> Worst depression 7  No Anxiety -> Worst Anxiety 8  No Drowsiness -> Worst Drowsiness 10  Best appetite-> Worst Appetitle 3  Best Feeling of well being -> Worst feeling 5  No dyspnea-> Worst dyspnea 8  Other problem (none -> severe) 10 - chest discomfort, when active  Completed by  patient         Last CT chest December 2016 and when compared to August 2016 no change in DIC pattern of ILD  Chest x-ray 10/04/2015: Reported as chronic changes or jaw baseline. I do not have these image for visualization  Lab work 07/26/2015: Shows a creatinine of 1.68 mg percent with a GFR of 43. Hemoglobin 13 g percent.  Cardiology visit with Dr. Domenic Polite 08/28/2015: There is discussion about switching his Lasix and metolazone and Demadex. Volume overload is likely multifactorial with RV dysfunction a significant component   Current outpatient  prescriptions:  .  albuterol (PROVENTIL HFA;VENTOLIN HFA) 108 (90 BASE) MCG/ACT inhaler, Inhale 2 puffs into the lungs every 6 (six) hours as needed for wheezing or shortness of breath., Disp: , Rfl:  .  albuterol (PROVENTIL) (2.5 MG/3ML) 0.083% nebulizer solution, Take 3 mLs (2.5 mg total) by nebulization every 3 (three) hours as needed for wheezing or shortness of breath., Disp: 75 mL, Rfl: 12 .  alfuzosin (UROXATRAL) 10 MG 24 hr tablet, Take 10 mg by mouth daily with breakfast., Disp: , Rfl:  .  ALPRAZolam (XANAX) 0.25 MG tablet, Take 0.25 mg by mouth at bedtime. , Disp: , Rfl:  .  atorvastatin (LIPITOR) 20 MG tablet, Take 20 mg by mouth daily., Disp: , Rfl:  .  budesonide-formoterol (SYMBICORT) 160-4.5 MCG/ACT inhaler, Inhale 2 puffs into the lungs 2 (two) times daily., Disp: 1 Inhaler, Rfl: 3 .  cholecalciferol (VITAMIN D) 1000 UNITS  tablet, Take 2,000 Units by mouth daily., Disp: , Rfl:  .  citalopram (CELEXA) 10 MG tablet, Take 10 mg by mouth daily., Disp: , Rfl:  .  clopidogrel (PLAVIX) 75 MG tablet, Take 1 tablet (75 mg total) by mouth daily., Disp: 30 tablet, Rfl: 11 .  diltiazem (CARDIZEM CD) 240 MG 24 hr capsule, Take 240 mg by mouth daily., Disp: , Rfl:  .  Fish Oil-Cholecalciferol (FISH OIL + D3 PO), Take 1 capsule by mouth 4 (four) times daily. , Disp: , Rfl:  .  FLUoxetine (PROZAC) 20 MG capsule, Take 20 mg by mouth daily., Disp: , Rfl:  .  furosemide (LASIX) 40 MG tablet, Take 80 mg by mouth 2 (two) times daily. , Disp: , Rfl:  .  gabapentin (NEURONTIN) 300 MG capsule, Take 300-600 mg by mouth 2 (two) times daily. 1 cap in morning and 2 caps at night., Disp: , Rfl:  .  insulin glargine (LANTUS) 100 UNIT/ML injection, Inject 0.4 mLs (40 Units total) into the skin daily., Disp: 10 mL, Rfl: 11 .  ipratropium-albuterol (DUONEB) 0.5-2.5 (3) MG/3ML SOLN, Take 3 mLs by nebulization every 6 (six) hours as needed (Shortness of breath)., Disp: , Rfl:  .  isosorbide mononitrate (IMDUR) 60 MG  24 hr tablet, Take 1 tablet (60 mg total) by mouth daily., Disp: 90 tablet, Rfl: 3 .  metolazone (ZAROXOLYN) 2.5 MG tablet, Take 1 tablet (2.5 mg total) by mouth daily. For 3 days; then use as needed for weight changes, Disp: 10 tablet, Rfl: 0 .  nitroGLYCERIN (NITROSTAT) 0.4 MG SL tablet, Place 1 tablet (0.4 mg total) under the tongue every 5 (five) minutes as needed for chest pain., Disp: 25 tablet, Rfl: 3 .  NON FORMULARY, Insulin: Take as directed, Disp: , Rfl:  .  omeprazole (PRILOSEC) 40 MG capsule, Take 40 mg by mouth daily., Disp: , Rfl:  .  ranitidine (ZANTAC) 300 MG tablet, Take 300 mg by mouth at bedtime. , Disp: , Rfl:  .  solifenacin (VESICARE) 5 MG tablet, Take 5 mg by mouth daily., Disp: , Rfl:  .  valsartan (DIOVAN) 40 MG tablet, Take 20 mg by mouth daily. , Disp: , Rfl:   .  Immunization History  Administered Date(s) Administered  . Influenza Split 04/04/2014, 03/27/2015  . Pneumococcal Polysaccharide-23 11/04/2012    Allergies  Allergen Reactions  . Oxycodone Itching and Nausea Only     Review of Systems     Objective:   Physical Exam  Filed Vitals:   10/14/15 1146  BP: 104/58  Pulse: 82  Height: 6' (1.829 m)  Weight: 256 lb 6.4 oz (116.302 kg)  SpO2: 98%   On 5 L [place] 97% on RA - 10 minutes  General exam: Sitting in a wheelchair Psychiatric: Flat affect. Depressed looking. HEENT: Oxygen on no neck nodes no elevated JVP Respiratory exam: Clear to auscultation bilaterally no wheezes Cardiovascular: Normal heart sounds regular rate and rhythm Abdomen: Obese soft nontender extremity is: No cyanosis no clubbing no edema Musculoskeletal: No obvious joint deformities       Assessment & Plan:     ICD-9-CM ICD-10-CM   1. Chronic respiratory failure with hypoxia ([name]) 518.83 J96.11    799.02    2. ILD (interstitial lung disease) ([place]) 515 J84.9      All your multiple medical problems and symptoms interfering with your quality of life Things appear  stable  PLAN Do ESAS symptom score Check pulse ox on room air  Double  check on home if you are on prednisone or not (you might be getting it confused it with prozac) Stop fish oil Continue home o2 3-6L to keep pulse ox > 88% Continue other meds including symbicort Refer home palliative care  Followup 3 months or sooner if needed    > 50% of this > 25 min visit spent in face to face counseling or coordination of care    Dr. Brand Males, M.D., [facility].C.P Pulmonary and Critical Care Medicine Staff Physician [facility] Pager: [phone], If no answer or between  15:00h - 7:00h: call [phone]  10/14/2015 12:25 PM

## 2015-10-29 IMAGING — US US ABDOMEN LIMITED
1 series · 13 of 25 positions shown · non-contrast
Comparison: None.

CLINICAL DATA: Right upper quadrant pain.

EXAM:
US ABDOMEN LIMITED - RIGHT UPPER QUADRANT

[Series 1: us abdomen limited · 0.18mm/px · 13 of 58 slices shown]
[im 1/58]
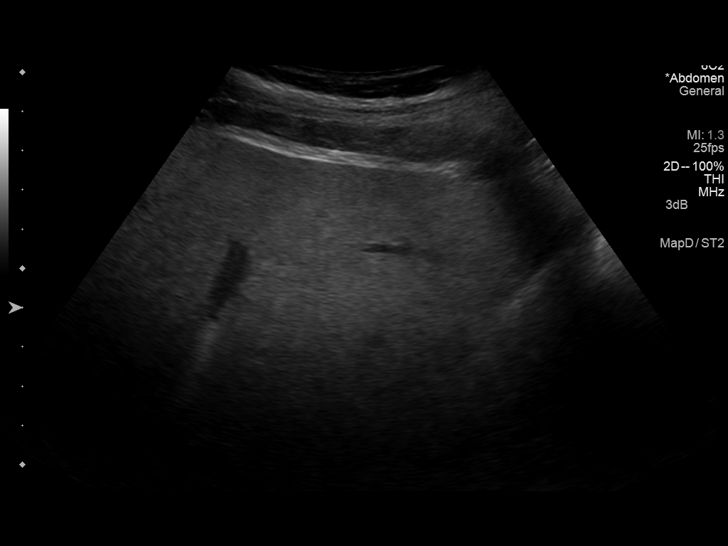
[im 5/58]
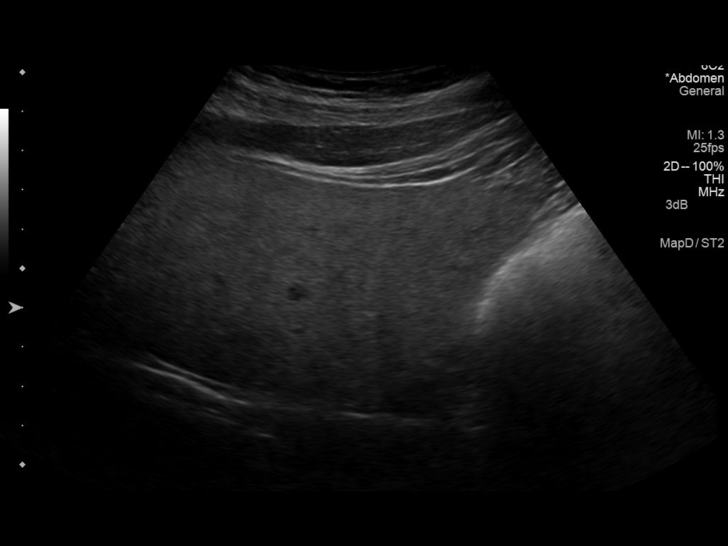
[im 10/58]
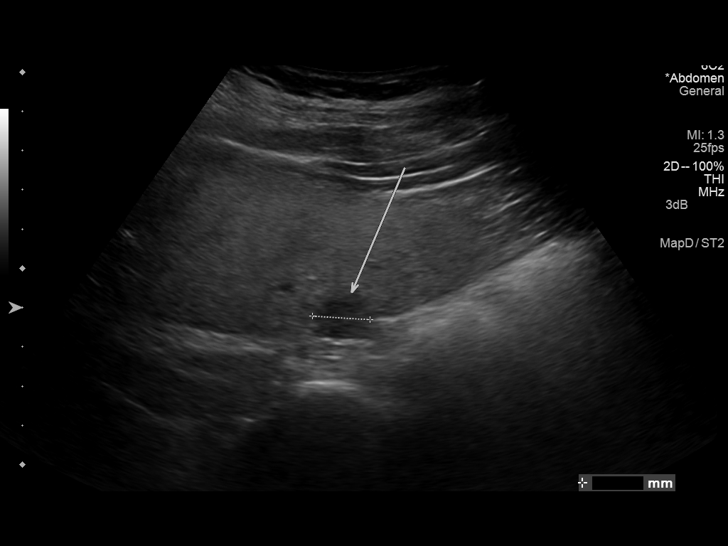
[im 15/58]
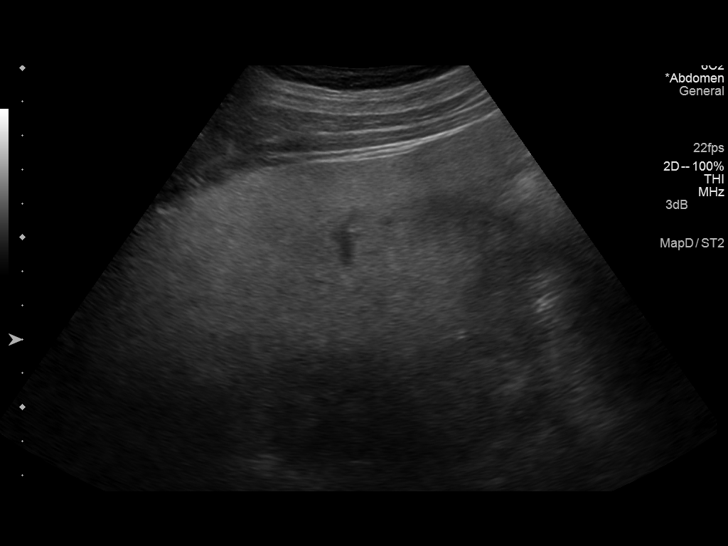
[im 20/58]
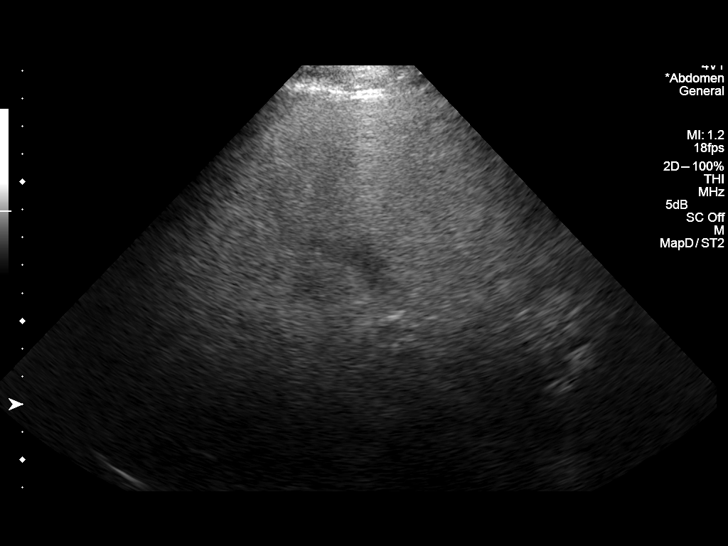
[im 24/58]
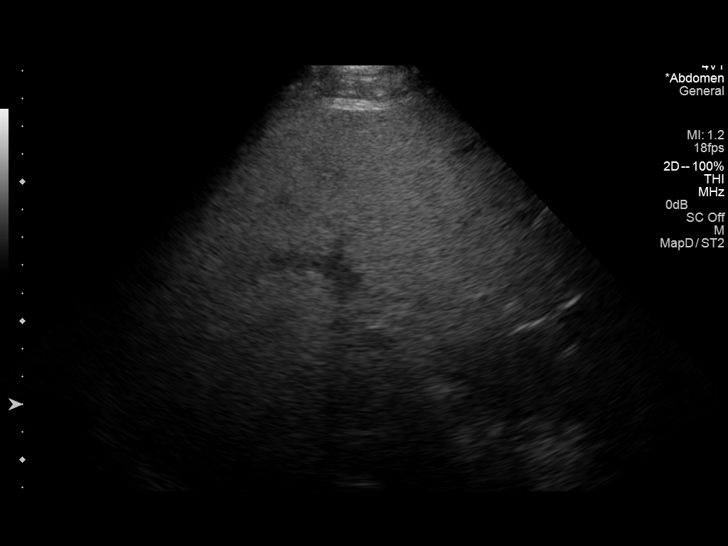
[im 29/58]
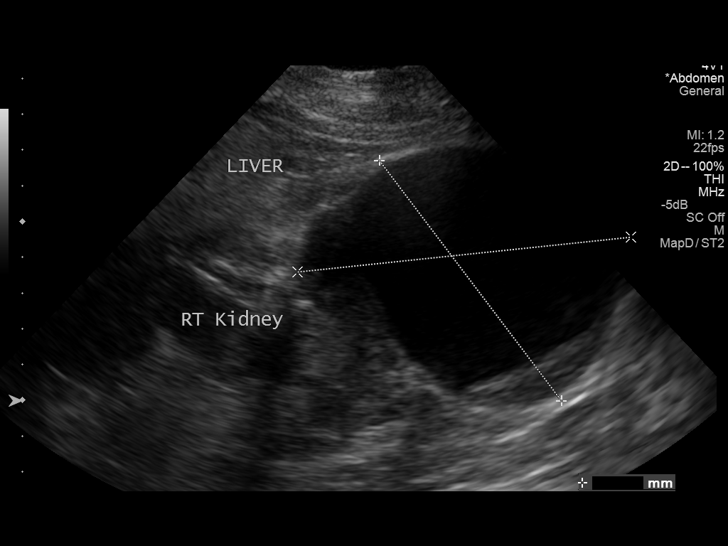
[im 34/58]
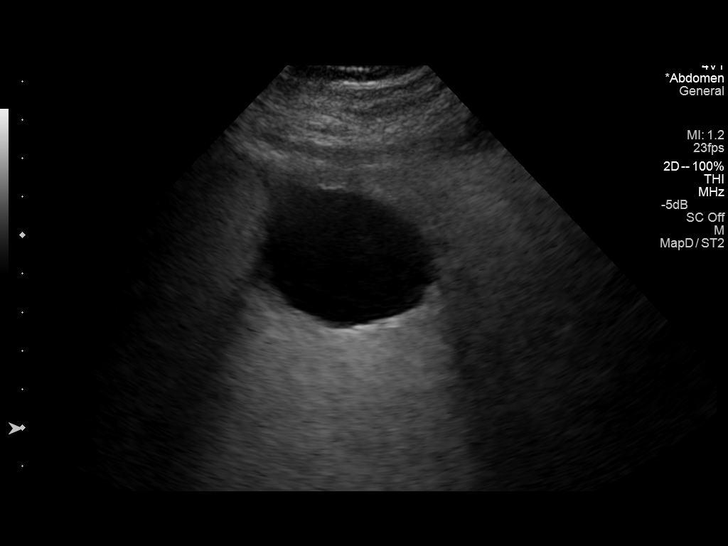
[im 39/58]
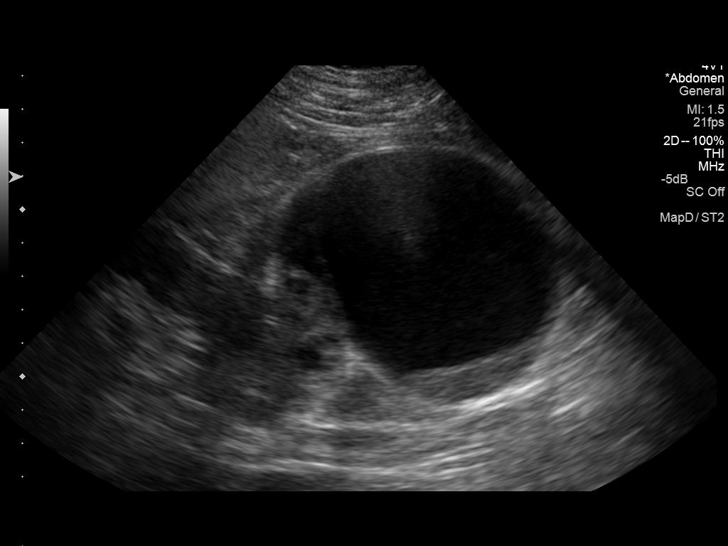
[im 43/58]
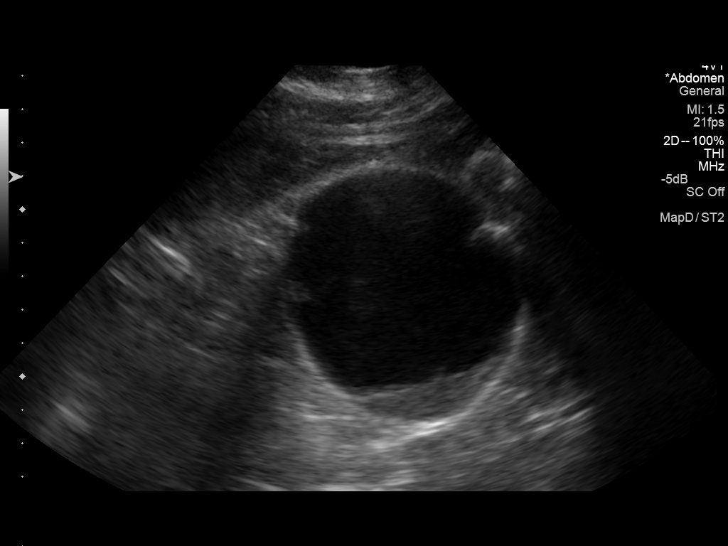
[im 48/58]
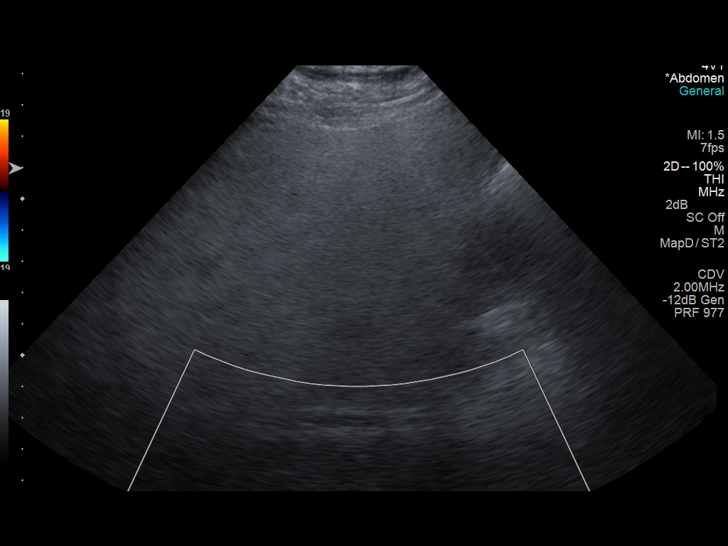
[im 53/58]
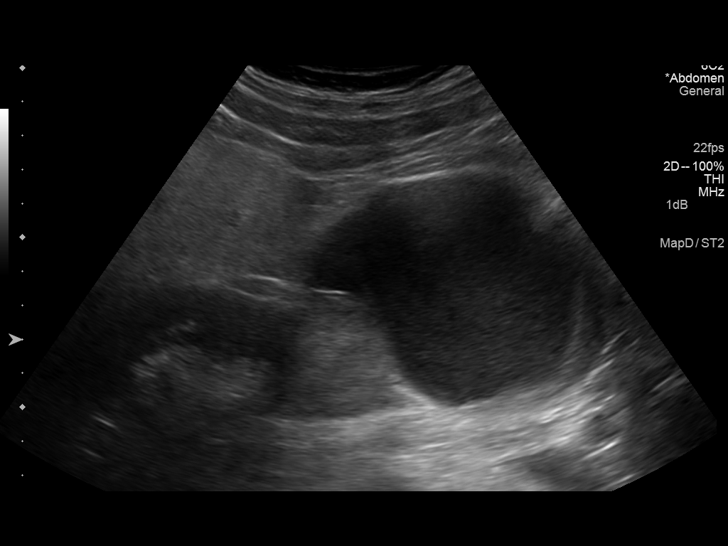
[im 58/58]
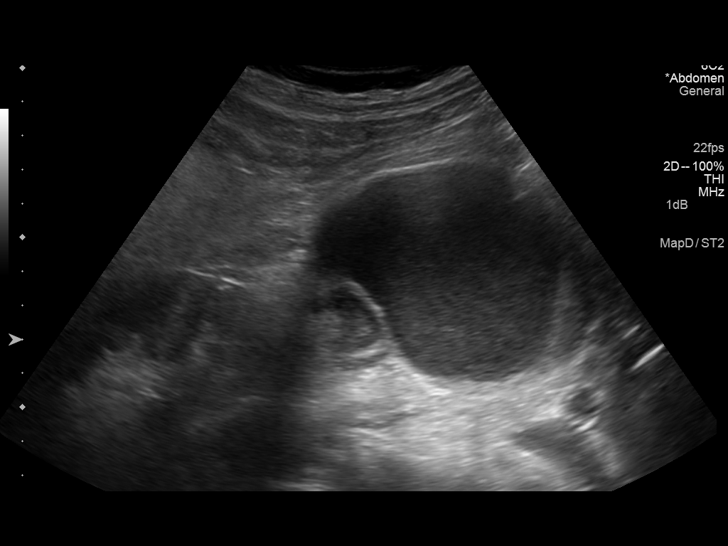

[13 of 25 positions shown; findings below may reference images not displayed]

FINDINGS: Gallbladder:

A complex cystic lesion is seen in the region of gallbladder fossa
which measures approximately 8.4 x 9.4 x 7.7 cm. The shows wall
thickening, internal echoes, and a fluid debris level. No definite
shadowing gallstones are seen. Is uncertain within this represents
an abnormal gallbladder due to cholecystitis versus a complex cystic
mass. This does not appear to arise from the liver or kidney.

Common bile duct:

Diameter: 6 mm

Liver:

Diffusely increased parenchymal echogenicity, consistent with
steatosis. A benign appearing cyst is seen in the left hepatic lobe
measuring 4.8 cm. In addition, there is a solid hypoechoic lesion
seen in the left hepatic lobe measuring 1.8 x 1.7 x 1.4 cm. This
could represent a solid mass or focal area of fatty sparing.
IMPRESSION: 9 cm right upper quadrant complex cystic lesion in the area of the
gallbladder fossa. It is uncertain within this represents an
abnormal gallbladder due to cholecystitis, versus a complex cystic
mass. No sonographic Murphy sign reported by sonographer. Recommend
clinical correlation, and consider abdomen CT or MRI for further
evaluation.

Diffuse hepatic steatosis. 1.8 cm indeterminate hypoechoic lesion in
the left hepatic lobe, with differential diagnosis including
neoplasm and focal fatty sparing. Recommend further imaging
characterization with abdomen MRI without and with contrast as the
preferred exam. (Abd CT without and with contrast would be
appropriate if patient has contraindication to MRI or cannot
cooperate with breath-holding.)

## 2015-10-30 ENCOUNTER — Telehealth: Payer: Self-pay | Admitting: Internal Medicine

## 2015-10-30 ENCOUNTER — Telehealth: Payer: Self-pay | Admitting: *Deleted

## 2015-10-30 MED ORDER — METOLAZONE 2.5 MG PO TABS: 2.5000 mg | ORAL_TABLET | Freq: Every day | ORAL | Status: DC

## 2015-10-30 NOTE — Telephone Encounter (Signed)
Per NP of [facility], patient has gained 5 lbs since yesterday. Patient weighs 252lbs today and was 247 lbs on yesterday. Patient also has increased sob, and is currently on O2 @6L  via [place]. Per NP, patient has edema all over body especially in his abdomen area and with a trace of edema in his ankles. No c/o dizziness or chest pain.  Patient is currently taking furosemide 80 mg twice daily. Patient has not used metolazone saying he is out of it. Nurse advised patient and NP that a refill would be sent to his pharmacy and MD would be notified in the meantime for further advise.

## 2015-10-30 NOTE — Telephone Encounter (Signed)
NP informed and verbalized understanding .

## 2015-10-30 NOTE — Telephone Encounter (Signed)
lmtcb x1 for [employer]

## 2015-10-30 NOTE — Telephone Encounter (Signed)
Spoke with Rodena Piety who is a NP with [facility]. States that she saw the pt for the first time today. She wanted to give [place] an update on the pt. He has had a 5lb weight gain in the past 2 days, this was addressed by his Cardiologist. Pt has been complaining of increased anxiety when he becomes short of breath. Rodena Piety says that she had a call into his PCP about possibly prescribing something for this. She does not need anything from [place] at this point, she was just calling to keep [place] in the loop.

## 2015-10-30 NOTE — Telephone Encounter (Signed)
[phone], anita cb

## 2015-10-30 NOTE — Telephone Encounter (Signed)
Agree, would try using metolazone again. Consider 2-3 day course along with Lasix, and then as needed thereafter.

## 2015-10-31 ENCOUNTER — Ambulatory Visit (INDEPENDENT_AMBULATORY_CARE_PROVIDER_SITE_OTHER): Payer: Medicaid Other | Admitting: Cardiology

## 2015-10-31 ENCOUNTER — Encounter: Payer: Self-pay | Admitting: Cardiology

## 2015-10-31 VITALS — BP 98/70 | HR 94 | Ht 72.0 in | Wt 254.0 lb

## 2015-10-31 DIAGNOSIS — N183 Chronic kidney disease, stage 3 unspecified: Secondary | ICD-10-CM

## 2015-10-31 DIAGNOSIS — I5033 Acute on chronic diastolic (congestive) heart failure: Secondary | ICD-10-CM

## 2015-10-31 DIAGNOSIS — I2510 Atherosclerotic heart disease of native coronary artery without angina pectoris: Secondary | ICD-10-CM

## 2015-10-31 DIAGNOSIS — I10 Essential (primary) hypertension: Secondary | ICD-10-CM | POA: Diagnosis not present

## 2015-10-31 DIAGNOSIS — J849 Interstitial pulmonary disease, unspecified: Secondary | ICD-10-CM

## 2015-10-31 NOTE — Patient Instructions (Signed)
Continue all current medications. Follow up in  2 months  

## 2015-10-31 NOTE — Progress Notes (Signed)
Cardiology Office Note  Date: 10/31/2015   ID: Kevin, [name] June 23, 1956, MRN [id number]  PCP: Betty [name], MD  Primary Cardiologist: [name], MD   Chief Complaint  Patient presents with  . Cardiac follow-up    History of Present Illness: [name] is a medically complex 60 y.o. male last seen in February. I reviewed the chart, he has had decline overall in pulmonary status, also continued problems with volume overload. He now has home palliative care involved. We just recently recommended an increase in diuretic with use of Zaroxolyn. He states he is picking up the medication today.  By our scales his weight is down significantly compared to the last visit. He does not report any angina symptoms, has NYHA class 3-4 dyspnea on home oxygen. With his leg swelling he has had dry skin and itching.  I reviewed his most recent lab work.  Past Medical History  Diagnosis Date  . Essential hypertension, benign   . History of stroke     Right MCA distribution, residual left-sided weakness  . Noncompliance   . COPD (chronic obstructive pulmonary disease) ([place])   . Hyperlipidemia   . History of pneumonia   . Type 2 diabetes mellitus ([place]) 2011  . GERD (gastroesophageal reflux disease)   . Cocaine abuse   . Cholecystitis, acute 12/20/2013    Lap chole 6/5  . Coronary atherosclerosis of native coronary artery     a. 03/09/2013 Cath/PCI: LM nl, LAD: 50p, 72m (2.5x16 promus DES), LCX nl, OM1 min irregs, LPL/LPDA diff dzs, RCA nondom, mod diff dzs, EF 55%.  . Depression   . Anxiety   . History of nephrolithiasis   . Arthritis   . Hepatitis Late 1970s  . Sleep apnea     On CPAP, 4L O2 no cpap at home yet  . Renal cell carcinoma [facility])     Status post radical right nephrectomy August 2015  . Headache     Past Surgical History  Procedure Laterality Date  . Appendectomy  1970's  . Cholecystectomy N/A 12/22/2013    Procedure: LAPAROSCOPIC CHOLECYSTECTOMY ;  Surgeon:  [name], MD;  Location: [place];  Service: General;  Laterality: N/A;  . Robot assisted laparoscopic nephrectomy Right 02/21/2014    Procedure: ROBOTIC ASSISTED LAPAROSCOPIC RIGHT NEPHRECTOMY ;  Surgeon: [name], MD;  Location: [facility];  Service: Urology;  Laterality: Right;  . Laparoscopic lysis of adhesions  02/21/2014    Procedure: LAPAROSCOPIC LYSIS OF ADHESIONS EXTINSIVE;  Surgeon: [name], MD;  Location: [facility];  Service: Urology;;  . Video assisted thoracoscopy Left 05/18/2014    Procedure: LEFT VIDEO ASSISTED THORACOSCOPY;  Surgeon: [name], MD;  Location: [place];  Service: Thoracic;  Laterality: Left;  . Lung biopsy Left 05/18/2014    Procedure: LUNG BIOPSY left upper lobe & left lower lobe;  Surgeon: [name], MD;  Location: [place];  Service: Thoracic;  Laterality: Left;  . Video bronchoscopy  05/18/2014    Procedure: VIDEO BRONCHOSCOPY;  Surgeon: [name], MD;  Location: [place];  Service: Thoracic;;  . Left heart catheterization with coronary angiogram N/A 06/02/2012    Procedure: LEFT HEART CATHETERIZATION WITH CORONARY ANGIOGRAM;  Surgeon: [name], MD;  Location: [facility] CATH LAB;  Service: Cardiovascular;  Laterality: N/A;  . Left heart catheterization with coronary angiogram N/A 03/09/2013    Procedure: LEFT HEART CATHETERIZATION WITH CORONARY ANGIOGRAM;  Surgeon: [name], MD;  Location: [facility] CATH  LAB;  Service: Cardiovascular;  Laterality: N/A;  . Left heart catheterization with coronary angiogram N/A 12/20/2013    Procedure: LEFT HEART CATHETERIZATION WITH CORONARY ANGIOGRAM;  Surgeon: [name], MD;  Location: [facility] CATH LAB;  Service: Cardiovascular;  Laterality: N/A;  . Endarterectomy Right 10/08/2014    Procedure: Right ENDARTERECTOMY CAROTID;  Surgeon: [name], MD;  Location: [place];  Service: Vascular;  Laterality: Right;  . Patch angioplasty Right 10/08/2014    Procedure: PATCH ANGIOPLASTY Right Carotid;   Surgeon: [name], MD;  Location: [facility];  Service: Vascular;  Laterality: Right;    Current Outpatient Prescriptions  Medication Sig Dispense Refill  . albuterol (PROVENTIL HFA;VENTOLIN HFA) 108 (90 BASE) MCG/ACT inhaler Inhale 2 puffs into the lungs every 6 (six) hours as needed for wheezing or shortness of breath.    [name] albuterol (PROVENTIL) (2.5 MG/3ML) 0.083% nebulizer solution Take 3 mLs (2.5 mg total) by nebulization every 3 (three) hours as needed for wheezing or shortness of breath. 75 mL 12  . alfuzosin (UROXATRAL) 10 MG 24 hr tablet Take 10 mg by mouth daily with breakfast.    . ALPRAZolam (XANAX) 0.25 MG tablet Take 0.25 mg by mouth at bedtime.     [name] atorvastatin (LIPITOR) 20 MG tablet Take 20 mg by mouth daily.    . budesonide-formoterol (SYMBICORT) 160-4.5 MCG/ACT inhaler Inhale 2 puffs into the lungs 2 (two) times daily. 1 Inhaler 3  . cholecalciferol (VITAMIN D) 1000 UNITS tablet Take 2,000 Units by mouth daily.    . citalopram (CELEXA) 10 MG tablet Take 10 mg by mouth daily.    . clopidogrel (PLAVIX) 75 MG tablet Take 1 tablet (75 mg total) by mouth daily. 30 tablet 11  . diltiazem (CARDIZEM CD) 240 MG 24 hr capsule Take 240 mg by mouth daily.    [name] FLUoxetine (PROZAC) 20 MG capsule Take 20 mg by mouth daily.    . furosemide (LASIX) 40 MG tablet Take 80 mg by mouth 2 (two) times daily.     [name] gabapentin (NEURONTIN) 300 MG capsule Take 300-600 mg by mouth 2 (two) times daily. 1 cap in morning and 2 caps at night.    . insulin glargine (LANTUS) 100 UNIT/ML injection Inject 0.4 mLs (40 Units total) into the skin daily. 10 mL 11  . ipratropium-albuterol (DUONEB) 0.5-2.5 (3) MG/3ML SOLN Take 3 mLs by nebulization every 6 (six) hours as needed (Shortness of breath).    . isosorbide mononitrate (IMDUR) 60 MG 24 hr tablet Take 1 tablet (60 mg total) by mouth daily. 90 tablet 3  . metolazone (ZAROXOLYN) 2.5 MG tablet Take 1 tablet (2.5 mg total) by mouth daily. For 3 days; then use as  needed for weight changes 10 tablet 0  . nitroGLYCERIN (NITROSTAT) 0.4 MG SL tablet Place 1 tablet (0.4 mg total) under the tongue every 5 (five) minutes as needed for chest pain. 25 tablet 3  . NON FORMULARY Insulin: Take as directed    . omeprazole (PRILOSEC) 40 MG capsule Take 40 mg by mouth daily.    . ranitidine (ZANTAC) 300 MG tablet Take 300 mg by mouth at bedtime.     . solifenacin (VESICARE) 5 MG tablet Take 5 mg by mouth daily.    . valsartan (DIOVAN) 40 MG tablet Take 20 mg by mouth daily.      No current facility-administered medications for this visit.   Allergies:  Oxycodone   Social History: The patient  reports that he  has been smoking Cigarettes.  He started smoking about 43 years ago. He has a 20.5 pack-year smoking history. He has never used smokeless tobacco. He reports that he does not drink alcohol or use illicit drugs.   ROS:  Please see the history of present illness. Otherwise, complete review of systems is positive for weakness.  All other systems are reviewed and negative.   Physical Exam: VS:  BP 98/70 mmHg  Pulse 94  Ht 6' (1.829 m)  Wt 254 lb (115.214 kg)  BMI 34.44 kg/m2  SpO2 92%, BMI Body mass index is 34.44 kg/(m^2).  Wt Readings from Last 3 Encounters:  10/31/15 254 lb (115.214 kg)  10/14/15 256 lb 6.4 oz (116.302 kg)  08/28/15 270 lb (122.471 kg)    Obese, chronically ill-appearing male. Oxygen via nasal cannula. HEENT: Conjunctiva and lids normal, oropharynx clear.  Neck: Supple, elevated JVP, bilateral carotid bruits, no thyromegaly.  Lungs: Diminished but clear to auscultation, nonlabored breathing at rest.  Cardiac: Regular rate and rhythm, no S3, 2/6 systolic murmur, no pericardial rub.  Abdomen: Protuberant, bowel sounds present, no guarding or rebound.  Extremities: Bilateral leg edema, 1-2+, distal pulses 2+.  Skin: Dry. Musculoskeletal: No kyphosis. Neuropsychiatric: Alert and oriented 3, affect appropriate.  ECG: I  personally reviewed the prior tracing from 07/23/2015 which showed sinus rhythm with left anterior fascicular block and decreased R wave progression.  Recent Labwork: 05/10/2015: Pro B Natriuretic peptide (BNP) 58.0 07/23/2015: ALT 22; AST 27; B Natriuretic Peptide 96.7 07/26/2015: BUN 30*; Creatinine, Ser 1.68*; Hemoglobin 13.1; Magnesium 2.0; Platelets 185; Potassium 5.3*; Sodium 134*     Component Value Date/Time   CHOL 128 12/19/2013 0220   TRIG 152* 12/19/2013 0220   HDL 31* 12/19/2013 0220   CHOLHDL 4.1 12/19/2013 0220   VLDL 30 12/19/2013 0220   LDLCALC 67 12/19/2013 0220  February 2017: BUN 18, creatinine 1.8, potassium 3.6  Other Studies Reviewed Today:  Cardiac catheterization 12/20/2013: Hemodynamic Findings: Central aortic pressure: 120/62 Left ventricular pressure: 127/7/15  Angiographic Findings:  Left main: Short segment. No obstructive disease.   Left Anterior Descending Artery: Moderate caliber vessel that courses to the apex. There is 40% proximal stenosis. The mid stented segment is patent without restenosis. The distal vessel has diffuse non-obstructive plaque. The diagonal branches are small in caliber with no obstructive disease.   Circumflex Artery: Large dominant system with large bifurcating first obtuse marginal branch and three small caliber posterolateral branches. The OM branch has diffuse 50% proximal stenosis, no focally obstructive lesions. The three small caliber posterolateral branches all have diffuse moderate to severe stenosis that is unchanged from last cath.   Right Coronary Artery: Small non-dominant vessel with 40% mid vessel stenosis.   Left Ventricular Angiogram: LVEF=65%  Impression: 1. Double vessel CAD with patent stent mid LAD 2. Moderate non-obstructive disease in the Circumflex and RCA 3. Normal LV systolic function  Echocardiogram 07/05/2015: Study Conclusions  - Left ventricle: The cavity size was normal. Wall thickness  was normal. Systolic function was normal. The estimated ejection fraction was in the range of 60% to 65%. Doppler parameters are consistent with abnormal left ventricular relaxation (grade 1 diastolic dysfunction).  Assessment and Plan:  1. Acute on chronic diastolic heart failure with suspected right heart symptoms as well as light of severe lung disease. For now, plan to continue current dose of Lasix with the addition of Zaroxolyn. Depending on response and subsequent volume status, may need to consider switch to Demadex.  2. Essential  hypertension, blood pressure low normal today. No active dizziness.  3. CKD stage III, recent creatinine 1.8.  4. CAD status post DES to the LAD in 2014. No active angina. Most recent cardiac catheterization from 2015 showed patent stent site with otherwise moderate residual disease is being managed medically.  5. Chronic hypoxic respiratory failure with interstitial lung disease at advanced stage. He now has palliative care at home. Keep follow-up with the pulmonary division.  Current medicines were reviewed with the patient today.  Disposition: FU with me in 2 months.   Signed, Satira Sark, MD, [facility] 10/31/2015 3:21 PM    [facility] [place], [place], [place] [address] Phone: [phone]; Fax: [phone]

## 2015-11-19 ENCOUNTER — Encounter: Payer: Self-pay | Admitting: *Deleted

## 2015-11-19 ENCOUNTER — Telehealth: Payer: Self-pay | Admitting: *Deleted

## 2015-11-19 NOTE — Telephone Encounter (Signed)
Spoke with pt who says he had labs done last week's OV with pcp - requested - says pcp wanted him admitted according to pt "potassium and sugar" were high. Says [facility] was supposed to come and have his CPAP mask fitted - they have not been to pt home as of yet - pt says he has not been wearing it. Will route labs when received.

## 2015-11-19 NOTE — Telephone Encounter (Signed)
Pcp called to say pt has not been seen in the last week. Called and LM pt to verify what primary dr he has seen so that we may get lab work

## 2015-11-19 NOTE — Telephone Encounter (Signed)
Difficult situation with complex medical disease. Hospice care may actually be a reasonable option if in fact his lung disease is as end-stage as the Pulmonary notes indicate. As far as diuretics are concerned, he might respond better to [facility], but I would suggest a BMET to follow up on renal function and potassium prior to making any changes.

## 2015-11-19 NOTE — Telephone Encounter (Signed)
[name], consulting NP for pt home health, says pt has gained 10 lbs in the last week. Says he is taking lasix, metolazone as directed. Also says pt is on 10 L of oxygen for lung disease/respitory failure would like orders for respiratory care at home (will forward to pulmonary). NP requesting Dr. Myles Gip guidance on weight gain and if he thinks he needs hospice care or continue palliative care at home. Will forward to Dr. Domenic Polite and pulmonologist

## 2015-11-20 NOTE — Telephone Encounter (Signed)
[name] called stating that his PCP is with [facility].

## 2015-11-21 NOTE — Telephone Encounter (Signed)
Received BMP from [place] - will have scanned and routed to Dr. Domenic Polite

## 2015-12-27 ENCOUNTER — Encounter: Payer: Self-pay | Admitting: Cardiology

## 2015-12-27 ENCOUNTER — Ambulatory Visit (INDEPENDENT_AMBULATORY_CARE_PROVIDER_SITE_OTHER): Payer: Medicaid Other | Admitting: Cardiology

## 2015-12-27 VITALS — BP 118/78 | HR 94 | Ht 72.0 in | Wt 260.0 lb

## 2015-12-27 DIAGNOSIS — I10 Essential (primary) hypertension: Secondary | ICD-10-CM

## 2015-12-27 DIAGNOSIS — N183 Chronic kidney disease, stage 3 unspecified: Secondary | ICD-10-CM

## 2015-12-27 DIAGNOSIS — J849 Interstitial pulmonary disease, unspecified: Secondary | ICD-10-CM

## 2015-12-27 DIAGNOSIS — I2510 Atherosclerotic heart disease of native coronary artery without angina pectoris: Secondary | ICD-10-CM

## 2015-12-27 DIAGNOSIS — I5033 Acute on chronic diastolic (congestive) heart failure: Secondary | ICD-10-CM

## 2015-12-27 MED ORDER — TORSEMIDE 20 MG PO TABS: 60.0000 mg | ORAL_TABLET | Freq: Two times a day (BID) | ORAL | Status: DC

## 2015-12-27 NOTE — Patient Instructions (Addendum)
   Stop Lasix.  Begin Demadex 20mg  - 3 tabs (60mg ) twice a day  - new sent to [facility] today. Continue all other medications.   Lab for BMET - due in 2 weeks, around January 10, 2016 - order given today. Office will contact with results via phone or letter.   Follow up in  2 months.

## 2015-12-27 NOTE — Progress Notes (Signed)
Cardiology Office Note  Date: 12/27/2015   ID: Jamias, Damm 11-Jul-1956, MRN [id number]  PCP: Betty [name], MD  Primary Cardiologist: [name], MD   Chief Complaint  Patient presents with  . Diastolic heart failure  . Coronary Artery Disease    History of Present Illness: [name] is a medically complex 60 y.o. male last seen in April. He presents for a follow-up visit. Hospice and [place] currently involved in his care and checks on him at home - this was arranged by Pulmonary. Since last assessment he has gained about 6 pounds, states that his urine output is not is vigorous even on fairly high-dose Lasix. He does not report any angina symptoms on Imdur and Cardizem CD.  I reviewed his medications. From a cardiac perspective he continues on Plavix, Lipitor, Cardizem CD, Imdur, as needed Zaroxolyn, Diovan, and Lasix. Currently not on potassium supplement. I discussed with him switching to Demadex from Lasix to see if this might provide better control of fluid status.  Past Medical History  Diagnosis Date  . Essential hypertension, benign   . History of stroke     Right MCA distribution, residual left-sided weakness  . Noncompliance   . COPD (chronic obstructive pulmonary disease) ([place])   . Hyperlipidemia   . History of pneumonia   . Type 2 diabetes mellitus ([place]) 2011  . GERD (gastroesophageal reflux disease)   . Cocaine abuse   . Cholecystitis, acute 12/20/2013    Lap chole 6/5  . Coronary atherosclerosis of native coronary artery     a. 03/09/2013 Cath/PCI: LM nl, LAD: 50p, 7m (2.5x16 promus DES), LCX nl, OM1 min irregs, LPL/LPDA diff dzs, RCA nondom, mod diff dzs, EF 55%.  . Depression   . Anxiety   . History of nephrolithiasis   . Arthritis   . Hepatitis Late 1970s  . Sleep apnea     On CPAP, 4L O2 no cpap at home yet  . Renal cell carcinoma [facility])     Status post radical right nephrectomy August 2015  . Headache     Past  Surgical History  Procedure Laterality Date  . Appendectomy  1970's  . Cholecystectomy N/A 12/22/2013    Procedure: LAPAROSCOPIC CHOLECYSTECTOMY ;  Surgeon: [name], MD;  Location: [place];  Service: General;  Laterality: N/A;  . Robot assisted laparoscopic nephrectomy Right 02/21/2014    Procedure: ROBOTIC ASSISTED LAPAROSCOPIC RIGHT NEPHRECTOMY ;  Surgeon: [name], MD;  Location: [facility];  Service: Urology;  Laterality: Right;  . Laparoscopic lysis of adhesions  02/21/2014    Procedure: LAPAROSCOPIC LYSIS OF ADHESIONS EXTINSIVE;  Surgeon: [name], MD;  Location: [facility];  Service: Urology;;  . Video assisted thoracoscopy Left 05/18/2014    Procedure: LEFT VIDEO ASSISTED THORACOSCOPY;  Surgeon: [name], MD;  Location: [place];  Service: Thoracic;  Laterality: Left;  . Lung biopsy Left 05/18/2014    Procedure: LUNG BIOPSY left upper lobe & left lower lobe;  Surgeon: [name], MD;  Location: [place];  Service: Thoracic;  Laterality: Left;  . Video bronchoscopy  05/18/2014    Procedure: VIDEO BRONCHOSCOPY;  Surgeon: [name], MD;  Location: [place];  Service: Thoracic;;  . Left heart catheterization with coronary angiogram N/A 06/02/2012    Procedure: LEFT HEART CATHETERIZATION WITH CORONARY ANGIOGRAM;  Surgeon: [name], MD;  Location: [facility] CATH LAB;  Service: Cardiovascular;  Laterality: N/A;  . Left heart catheterization  with coronary angiogram N/A 03/09/2013    Procedure: LEFT HEART CATHETERIZATION WITH CORONARY ANGIOGRAM;  Surgeon: [name], MD;  Location: [facility] CATH LAB;  Service: Cardiovascular;  Laterality: N/A;  . Left heart catheterization with coronary angiogram N/A 12/20/2013    Procedure: LEFT HEART CATHETERIZATION WITH CORONARY ANGIOGRAM;  Surgeon: [name], MD;  Location: [facility] CATH LAB;  Service: Cardiovascular;  Laterality: N/A;  . Endarterectomy Right 10/08/2014    Procedure: Right ENDARTERECTOMY CAROTID;  Surgeon:  [name], MD;  Location: [place];  Service: Vascular;  Laterality: Right;  . Patch angioplasty Right 10/08/2014    Procedure: PATCH ANGIOPLASTY Right Carotid;  Surgeon: [name], MD;  Location: [facility] OR;  Service: Vascular;  Laterality: Right;    Current Outpatient Prescriptions  Medication Sig Dispense Refill  . albuterol (PROVENTIL HFA;VENTOLIN HFA) 108 (90 BASE) MCG/ACT inhaler Inhale 2 puffs into the lungs every 6 (six) hours as needed for wheezing or shortness of breath.    [name] albuterol (PROVENTIL) (2.5 MG/3ML) 0.083% nebulizer solution Take 3 mLs (2.5 mg total) by nebulization every 3 (three) hours as needed for wheezing or shortness of breath. 75 mL 12  . alfuzosin (UROXATRAL) 10 MG 24 hr tablet Take 10 mg by mouth daily with breakfast.    . atorvastatin (LIPITOR) 20 MG tablet Take 20 mg by mouth daily.    . budesonide-formoterol (SYMBICORT) 160-4.5 MCG/ACT inhaler Inhale 2 puffs into the lungs 2 (two) times daily. 1 Inhaler 3  . cholecalciferol (VITAMIN D) 1000 UNITS tablet Take 2,000 Units by mouth daily.    . citalopram (CELEXA) 10 MG tablet Take 10 mg by mouth daily.    . clonazePAM (KLONOPIN) 0.5 MG tablet Take 0.5 mg by mouth 2 (two) times daily as needed for anxiety.    . clopidogrel (PLAVIX) 75 MG tablet Take 1 tablet (75 mg total) by mouth daily. 30 tablet 11  . diltiazem (CARDIZEM CD) 240 MG 24 hr capsule Take 240 mg by mouth daily.    [name] FLUoxetine (PROZAC) 20 MG capsule Take 40 mg by mouth daily.     [name] gabapentin (NEURONTIN) 300 MG capsule Take 300-600 mg by mouth 2 (two) times daily. 1 cap in morning and 2 caps at night.    . insulin aspart (NOVOLOG) 100 UNIT/ML injection Inject 30 Units into the skin 3 (three) times daily with meals.    . insulin glargine (LANTUS) 100 UNIT/ML injection Inject 0.4 mLs (40 Units total) into the skin daily. 10 mL 11  . ipratropium-albuterol (DUONEB) 0.5-2.5 (3) MG/3ML SOLN Take 3 mLs by nebulization every 6 (six) hours as needed (Shortness of  breath).    . isosorbide mononitrate (IMDUR) 60 MG 24 hr tablet Take 1 tablet (60 mg total) by mouth daily. 90 tablet 3  . metolazone (ZAROXOLYN) 2.5 MG tablet Take 1 tablet (2.5 mg total) by mouth daily. For 3 days; then use as needed for weight changes 10 tablet 0  . nitroGLYCERIN (NITROSTAT) 0.4 MG SL tablet Place 1 tablet (0.4 mg total) under the tongue every 5 (five) minutes as needed for chest pain. 25 tablet 3  . NON FORMULARY 110 Units. Insulin: Take as directed    . omeprazole (PRILOSEC) 40 MG capsule Take 40 mg by mouth daily.    . ranitidine (ZANTAC) 300 MG tablet Take 300 mg by mouth at bedtime.     . solifenacin (VESICARE) 5 MG tablet Take 5 mg by mouth daily.    . valsartan (DIOVAN) 40  MG tablet Take 20 mg by mouth daily.     [name] torsemide (DEMADEX) 20 MG tablet Take 3 tablets (60 mg total) by mouth 2 (two) times daily. 180 tablet 6   No current facility-administered medications for this visit.   Allergies:  Oxycodone   Social History: The patient  reports that he has been smoking Cigarettes.  He started smoking about 43 years ago. He has a 20.5 pack-year smoking history. He has never used smokeless tobacco. He reports that he does not drink alcohol or use illicit drugs.   ROS:  Please see the history of present illness. Otherwise, complete review of systems is positive for chronic fatigue and shortness of breath.  All other systems are reviewed and negative.   Physical Exam: VS:  BP 118/78 mmHg  Pulse 94  Ht 6' (1.829 m)  Wt 260 lb (117.935 kg)  BMI 35.25 kg/m2  SpO2 94%, BMI Body mass index is 35.25 kg/(m^2).  Wt Readings from Last 3 Encounters:  12/27/15 260 lb (117.935 kg)  10/31/15 254 lb (115.214 kg)  10/14/15 256 lb 6.4 oz (116.302 kg)    Obese, chronically ill-appearing male. Oxygen via nasal cannula. HEENT: Conjunctiva and lids normal, oropharynx clear.  Neck: Supple, elevated JVP, bilateral carotid bruits, no thyromegaly.  Lungs: Diminished but clear to  auscultation, nonlabored breathing at rest.  Cardiac: Regular rate and rhythm, no S3, 2/6 systolic murmur, no pericardial rub.  Abdomen: Protuberant, bowel sounds present, no guarding or rebound.  Extremities: Bilateral leg edema, 2+, distal pulses 2+.  Skin: Dry. Musculoskeletal: No kyphosis. Neuropsychiatric: Alert and oriented 3, affect appropriate.  ECG: I personally reviewed the prior tracing from 07/23/2015 which showed sinus rhythm with left anterior fascicular block and decreased R wave progression.  Recent Labwork: 05/10/2015: Pro B Natriuretic peptide (BNP) 58.0 07/23/2015: ALT 22; AST 27; B Natriuretic Peptide 96.7 07/26/2015: BUN 30*; Creatinine, Ser 1.68*; Hemoglobin 13.1; Magnesium 2.0; Platelets 185; Potassium 5.3*; Sodium 134*     Component Value Date/Time   CHOL 128 12/19/2013 0220   TRIG 152* 12/19/2013 0220   HDL 31* 12/19/2013 0220   CHOLHDL 4.1 12/19/2013 0220   VLDL 30 12/19/2013 0220   LDLCALC 67 12/19/2013 0220  April 2017: Hemoglobin A1c 13.3, BUN 26, creatinine 1.9, potassium 3.2  Other Studies Reviewed Today:  Cardiac catheterization 12/20/2013: Hemodynamic Findings: Central aortic pressure: 120/62 Left ventricular pressure: 127/7/15  Angiographic Findings:  Left main: Short segment. No obstructive disease.   Left Anterior Descending Artery: Moderate caliber vessel that courses to the apex. There is 40% proximal stenosis. The mid stented segment is patent without restenosis. The distal vessel has diffuse non-obstructive plaque. The diagonal branches are small in caliber with no obstructive disease.   Circumflex Artery: Large dominant system with large bifurcating first obtuse marginal branch and three small caliber posterolateral branches. The OM branch has diffuse 50% proximal stenosis, no focally obstructive lesions. The three small caliber posterolateral branches all have diffuse moderate to severe stenosis that is unchanged from last cath.   Right  Coronary Artery: Small non-dominant vessel with 40% mid vessel stenosis.   Left Ventricular Angiogram: LVEF=65%  Impression: 1. Double vessel CAD with patent stent mid LAD 2. Moderate non-obstructive disease in the Circumflex and RCA 3. Normal LV systolic function  Echocardiogram 07/05/2015: Study Conclusions  - Left ventricle: The cavity size was normal. Wall thickness was normal. Systolic function was normal. The estimated ejection fraction was in the range of 60% to 65%. Doppler parameters are consistent  with abnormal left ventricular relaxation (grade 1 diastolic dysfunction).  Assessment and Plan:  1. Acute on chronic diastolic heart failure with LVEF 60-65% by most recent assessment. Plan to switch from Lasix to Demadex at 60 mg twice daily, can continue to use Zaroxolyn as needed. Needs BMET in a few weeks, a potassium supplement may also be required.  2. End-stage chronic lung disease with COPD/ILD and chronic hypoxic respiratory failure. Followed by Dr. Chase Caller. Now has [facility]ospice and [facility] of  involved.  3. Poorly controlled type 2 diabetes mellitus. Hemoglobin A1c in April was 13%.  4. CKD stage III, last creatinine 1.9.  5. Multivessel CAD as outlined above, currently with adequate angina control and present medication.  Current medicines were reviewed with the patient today.   Orders Placed This Encounter  Procedures  . Basic metabolic panel    Disposition: FU with me in 2 months.   Signed, Satira Sark, MD, [facility] 12/27/2015 11:47 AM    [place] at [place], [place], [place] [address] Phone: [phone]; Fax: [phone]

## 2016-01-10 ENCOUNTER — Telehealth: Payer: Self-pay

## 2016-01-10 NOTE — Telephone Encounter (Signed)
[name] from [facility] called to let us know that the nurse that went out today could not draw the blood for the BMP that was ordered on pt. They will go back on Tuesday to try again.

## 2016-01-20 ENCOUNTER — Ambulatory Visit: Payer: Medicaid Other | Admitting: Internal Medicine

## 2016-01-22 ENCOUNTER — Telehealth: Payer: Self-pay | Admitting: *Deleted

## 2016-01-22 DIAGNOSIS — I10 Essential (primary) hypertension: Secondary | ICD-10-CM

## 2016-01-22 NOTE — Telephone Encounter (Signed)
-----   Message from Satira Sark, MD sent at 01/20/2016  8:32 AM EDT ----- Results reviewed. Degree of renal insufficiency is stable, creatinine 1.8. Potassium low at 3.1. Since he continues on aggressive diuretic therapy, would start KCl 20 mEq daily. Plan follow-up BMET in a few weeks. A copy of this test should be forwarded to Betty [name], MD.

## 2016-01-22 NOTE — Telephone Encounter (Signed)
Pt aware, says he is already taking KCl did not know dose - was in car would call back to let us know dose - will mail lab orders pt has home health that will draw labs in 3 weeks. Routed to pcp

## 2016-01-23 ENCOUNTER — Other Ambulatory Visit: Payer: Self-pay | Admitting: Cardiology

## 2016-01-23 ENCOUNTER — Other Ambulatory Visit: Payer: Self-pay

## 2016-01-23 MED ORDER — POTASSIUM CHLORIDE CRYS ER 20 MEQ PO TBCR: 20.0000 meq | EXTENDED_RELEASE_TABLET | Freq: Every day | ORAL | Status: DC

## 2016-01-23 MED ORDER — NITROGLYCERIN 0.4 MG SL SUBL: 0.4000 mg | SUBLINGUAL_TABLET | SUBLINGUAL | Status: DC | PRN

## 2016-01-23 NOTE — Telephone Encounter (Signed)
Medication sent to pharmacy. Pt aware. ?

## 2016-01-23 NOTE — Telephone Encounter (Signed)
Mr. Haaf called stating that he needs RX of Potassium called in.

## 2016-02-06 ENCOUNTER — Telehealth: Payer: Self-pay | Admitting: *Deleted

## 2016-02-06 NOTE — Telephone Encounter (Signed)
-----   Message from Satira Sark, MD sent at 02/06/2016  7:54 AM EDT ----- Results reviewed. Creatinine stable at 1.8, potassium improved to 4.0. Continue current regimen. A copy of this test should be forwarded to Betty [name], MD.

## 2016-02-07 NOTE — Telephone Encounter (Signed)
Patient informed and copy sent to PCP. 

## 2016-02-10 ENCOUNTER — Other Ambulatory Visit: Payer: Self-pay

## 2016-02-14 ENCOUNTER — Encounter (HOSPITAL_BASED_OUTPATIENT_CLINIC_OR_DEPARTMENT_OTHER): Payer: Medicaid Other | Attending: Internal Medicine

## 2016-02-14 DIAGNOSIS — Z8673 Personal history of transient ischemic attack (TIA), and cerebral infarction without residual deficits: Secondary | ICD-10-CM | POA: Diagnosis not present

## 2016-02-14 DIAGNOSIS — I130 Hypertensive heart and chronic kidney disease with heart failure and stage 1 through stage 4 chronic kidney disease, or unspecified chronic kidney disease: Secondary | ICD-10-CM | POA: Diagnosis not present

## 2016-02-14 DIAGNOSIS — I2510 Atherosclerotic heart disease of native coronary artery without angina pectoris: Secondary | ICD-10-CM | POA: Diagnosis not present

## 2016-02-14 DIAGNOSIS — N183 Chronic kidney disease, stage 3 (moderate): Secondary | ICD-10-CM | POA: Insufficient documentation

## 2016-02-14 DIAGNOSIS — I5033 Acute on chronic diastolic (congestive) heart failure: Secondary | ICD-10-CM | POA: Diagnosis not present

## 2016-02-14 DIAGNOSIS — E11622 Type 2 diabetes mellitus with other skin ulcer: Secondary | ICD-10-CM | POA: Diagnosis not present

## 2016-02-14 DIAGNOSIS — L98491 Non-pressure chronic ulcer of skin of other sites limited to breakdown of skin: Secondary | ICD-10-CM | POA: Diagnosis not present

## 2016-02-14 DIAGNOSIS — L97221 Non-pressure chronic ulcer of left calf limited to breakdown of skin: Secondary | ICD-10-CM | POA: Insufficient documentation

## 2016-02-14 DIAGNOSIS — L97511 Non-pressure chronic ulcer of other part of right foot limited to breakdown of skin: Secondary | ICD-10-CM | POA: Insufficient documentation

## 2016-02-14 DIAGNOSIS — M1990 Unspecified osteoarthritis, unspecified site: Secondary | ICD-10-CM | POA: Diagnosis not present

## 2016-02-14 DIAGNOSIS — J449 Chronic obstructive pulmonary disease, unspecified: Secondary | ICD-10-CM | POA: Diagnosis not present

## 2016-02-14 DIAGNOSIS — E11621 Type 2 diabetes mellitus with foot ulcer: Secondary | ICD-10-CM | POA: Diagnosis not present

## 2016-02-14 DIAGNOSIS — Z9981 Dependence on supplemental oxygen: Secondary | ICD-10-CM | POA: Diagnosis not present

## 2016-02-14 DIAGNOSIS — F17210 Nicotine dependence, cigarettes, uncomplicated: Secondary | ICD-10-CM | POA: Diagnosis not present

## 2016-02-14 DIAGNOSIS — E1122 Type 2 diabetes mellitus with diabetic chronic kidney disease: Secondary | ICD-10-CM | POA: Diagnosis not present

## 2016-02-14 DIAGNOSIS — G4730 Sleep apnea, unspecified: Secondary | ICD-10-CM | POA: Diagnosis not present

## 2016-02-14 DIAGNOSIS — Z905 Acquired absence of kidney: Secondary | ICD-10-CM | POA: Diagnosis not present

## 2016-02-19 ENCOUNTER — Other Ambulatory Visit: Payer: Self-pay

## 2016-02-25 ENCOUNTER — Other Ambulatory Visit: Payer: Self-pay

## 2016-02-25 ENCOUNTER — Ambulatory Visit: Payer: Medicaid Other | Admitting: Neurology

## 2016-02-27 ENCOUNTER — Other Ambulatory Visit: Payer: Self-pay | Admitting: *Deleted

## 2016-02-27 ENCOUNTER — Telehealth: Payer: Self-pay | Admitting: *Deleted

## 2016-02-27 MED ORDER — NITROGLYCERIN 0.4 MG SL SUBL: 0.4000 mg | SUBLINGUAL_TABLET | SUBLINGUAL | 0 refills | Status: DC | PRN

## 2016-02-27 NOTE — Telephone Encounter (Signed)
Per Home Health nurse, patient c/o CP that started yesterday rated 9/10 and today rated 4/10. Patient said he took 5 nitroglycerin yesterday and 4 today. Patient also c/o increased sob. No c/o dizziness. Patient advised that after taking 3 nitroglycerin with no relief that he should have reported to the ED for an evaluation. Patient and nurse advised that patient needed to go to the ED now for an evaluation. Patient refused to go and said that if the CP continued through tomorrow, that he would go then.

## 2016-02-28 ENCOUNTER — Encounter (HOSPITAL_BASED_OUTPATIENT_CLINIC_OR_DEPARTMENT_OTHER): Payer: Medicaid Other | Attending: Internal Medicine
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
## 2016-03-05 ENCOUNTER — Encounter: Payer: Self-pay | Admitting: Cardiology

## 2016-03-05 ENCOUNTER — Ambulatory Visit (INDEPENDENT_AMBULATORY_CARE_PROVIDER_SITE_OTHER): Payer: Medicaid Other | Admitting: Cardiology

## 2016-03-05 VITALS — BP 91/59 | HR 97 | Ht 72.0 in | Wt 255.4 lb

## 2016-03-05 DIAGNOSIS — I5032 Chronic diastolic (congestive) heart failure: Secondary | ICD-10-CM | POA: Diagnosis not present

## 2016-03-05 DIAGNOSIS — N183 Chronic kidney disease, stage 3 unspecified: Secondary | ICD-10-CM

## 2016-03-05 DIAGNOSIS — I25119 Atherosclerotic heart disease of native coronary artery with unspecified angina pectoris: Secondary | ICD-10-CM

## 2016-03-05 MED ORDER — RANOLAZINE ER 500 MG PO TB12: 500.0000 mg | ORAL_TABLET | Freq: Two times a day (BID) | ORAL | 3 refills | Status: DC

## 2016-03-05 MED ORDER — RANOLAZINE ER 500 MG PO TB12
500.0000 mg | ORAL_TABLET | Freq: Two times a day (BID) | ORAL | 3 refills | Status: DC
Start: 2016-03-05 — End: 2017-04-23

## 2016-03-05 NOTE — Patient Instructions (Signed)
Medication Instructions:   Your physician has recommended you make the following change in your medication:   Stop diovan or valsartan.  Start ranexa 500 mg twice daily. Samples provided to get you started.  Continue all other medications the same.  Labwork: NONE  Testing/Procedures: NONE  Follow-Up:  Your physician recommends that you schedule a follow-up appointment in: 3 months.  Any Other Special Instructions Will Be Listed Below (If Applicable).  If you need a refill on your cardiac medications before your next appointment, please call your pharmacy.

## 2016-03-25 IMAGING — CT CT CHEST HIGH RESOLUTION W/O CM
3 of 7 series · 13 of 36 positions shown, 15 images · non-contrast
Comparison: No priors.

CLINICAL DATA: 57-year-old male with recent history of dyspnea.
Recent right-sided nephrectomy on 03/23/2014 for renal cancer. On
oxygen since discharge from hospital. Evaluate for underlying
interstitial lung disease.

EXAM:
CT CHEST WITHOUT CONTRAST
TECHNIQUE: Multidetector CT imaging of the chest was performed following the
standard protocol without intravenous contrast. High resolution
imaging of the lungs, as well as inspiratory and expiratory imaging,
was performed.

[Series 5: lung · axial · 0.87mm/px · z∈[-262,-72]mm · 7 of 52 slices shown, 9 images]
[im 7/52  mediastinal]
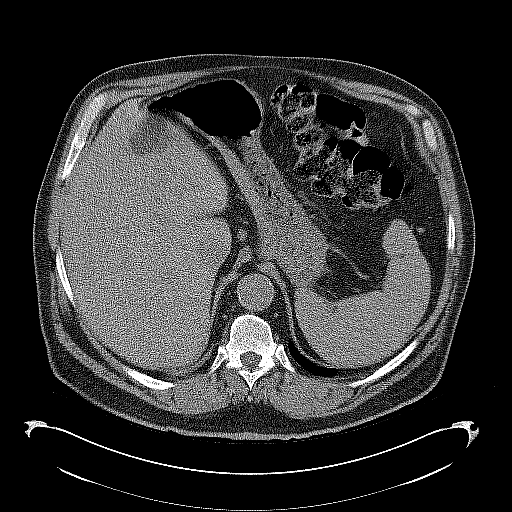
[im 7/52  lung]
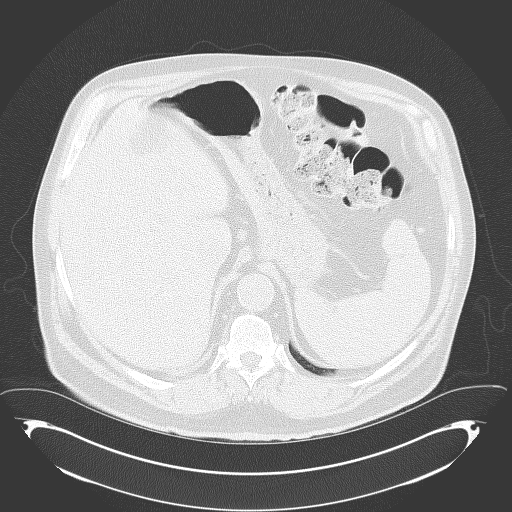
[im 13/52  lung]
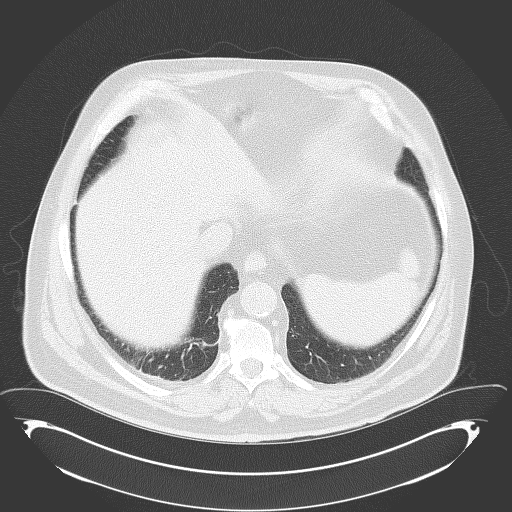
[im 20/52  lung]
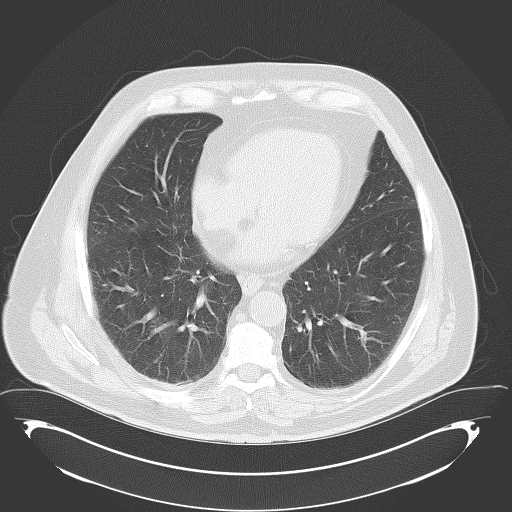
[im 26/52  lung]
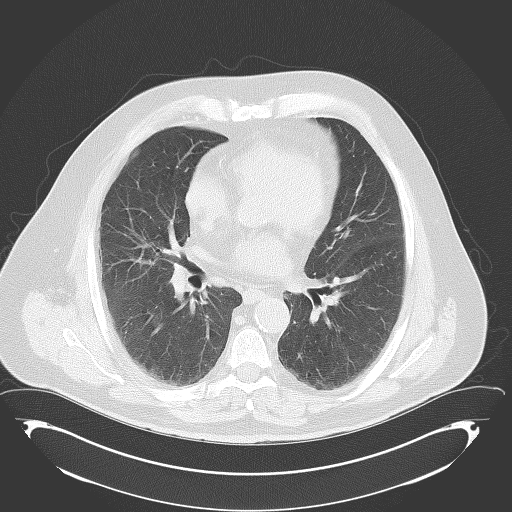
[im 32/52  mediastinal]
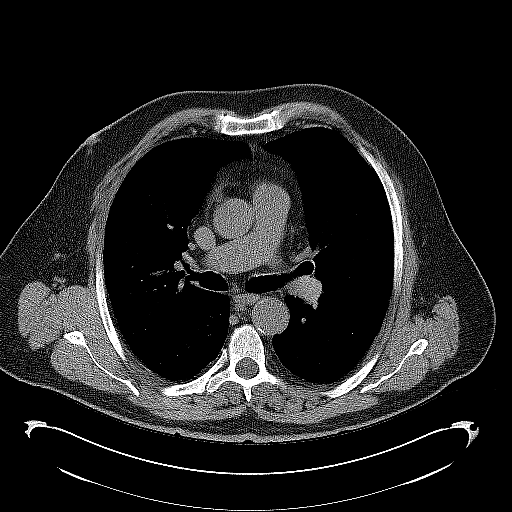
[im 32/52  lung]
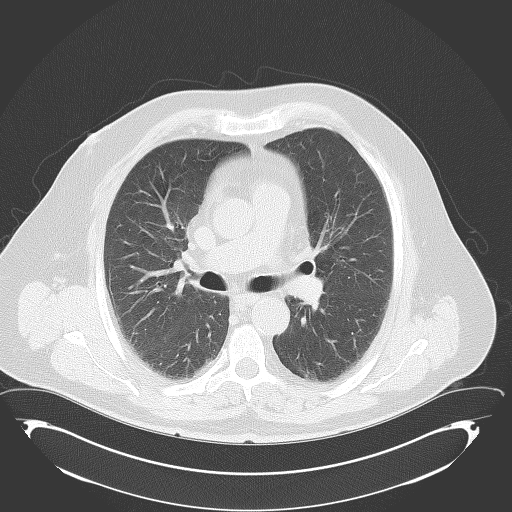
[im 39/52  lung]
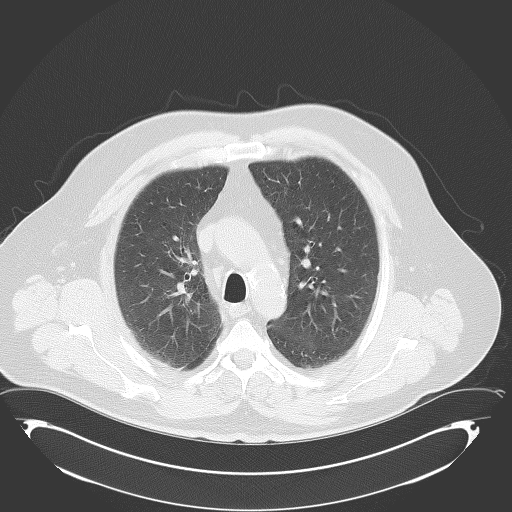
[im 45/52  lung]
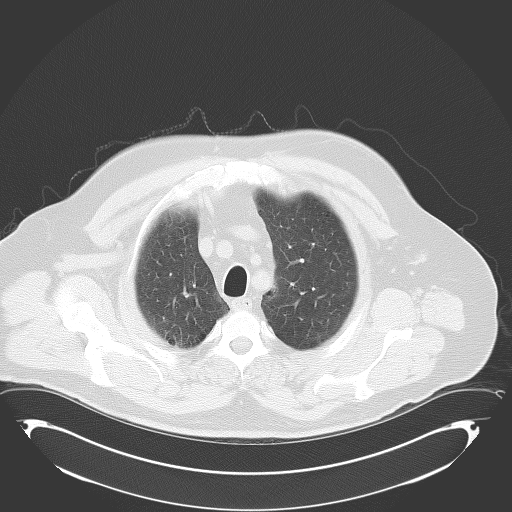

[Series 8: hires retro entire lungs · axial · 0.87mm/px · z∈[-232,-102]mm · 3 of 27 slices shown]
[im 7/27  lung]
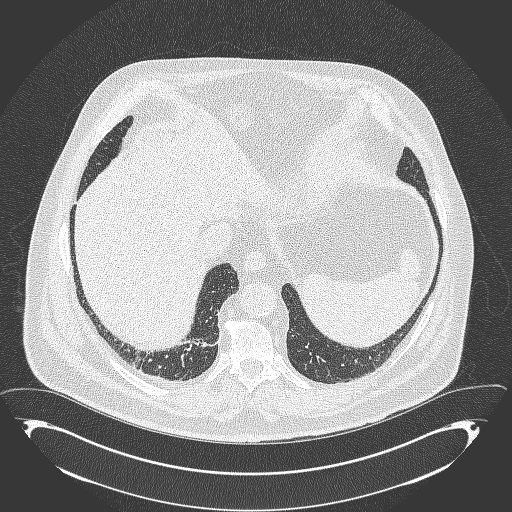
[im 14/27  lung]
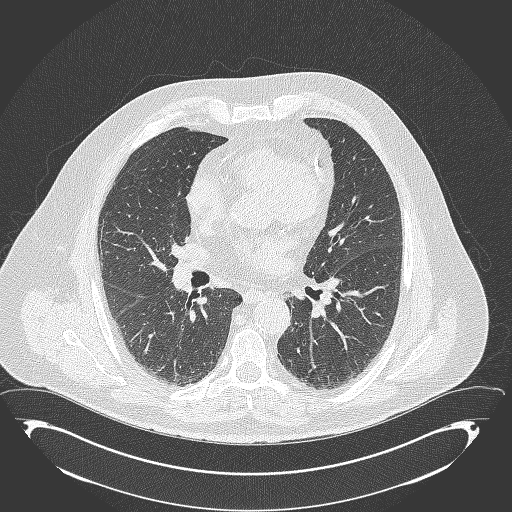
[im 20/27  lung]
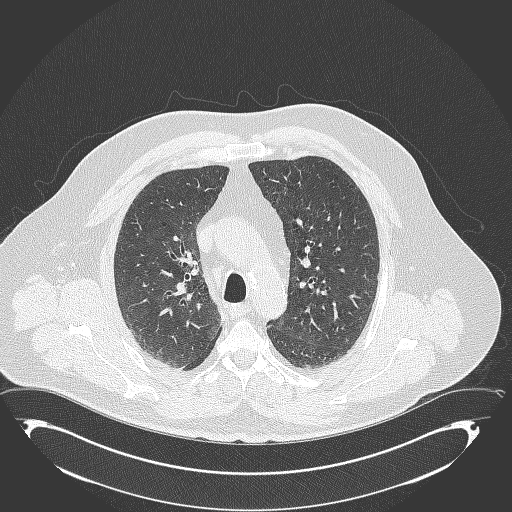

[Series 602: cor · coronal · 0.87mm/px · 3 of 138 slices shown]
[im 28/138  lung]
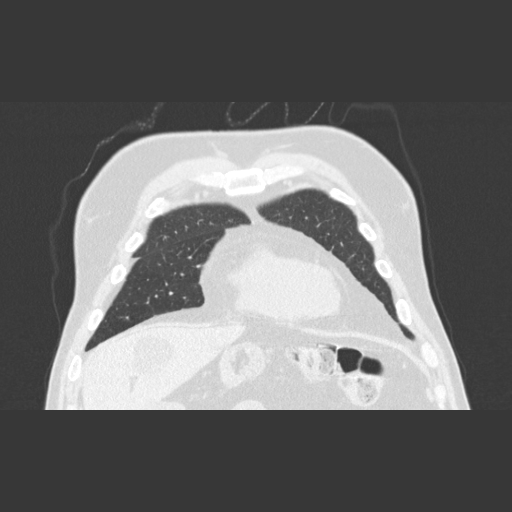
[im 55/138  lung]
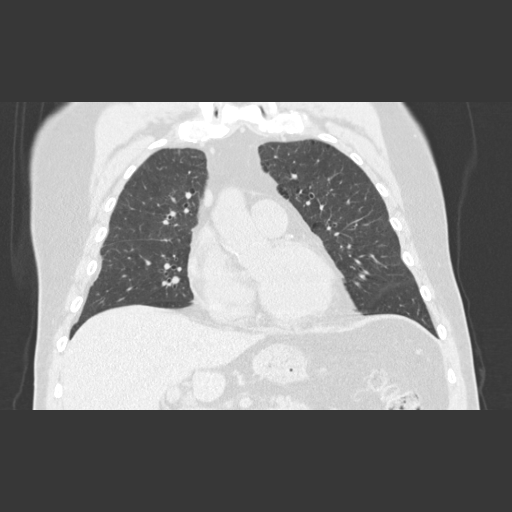
[im 83/138  lung]
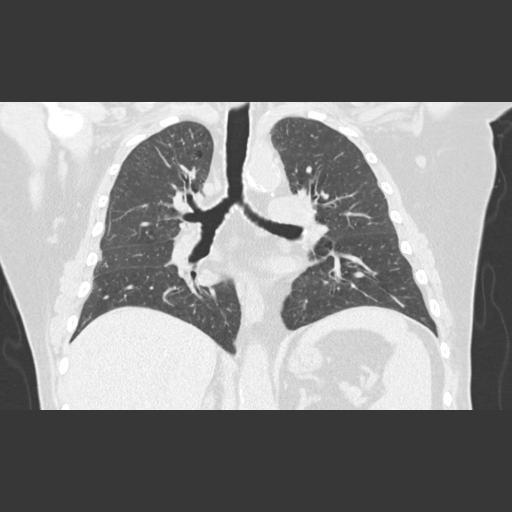

[13 of 36 positions shown; findings below may reference images not displayed]

FINDINGS: Mediastinum: Heart size is normal. There is no significant
pericardial fluid, thickening or pericardial calcification. There is
atherosclerosis of the thoracic aorta, the great vessels of the
mediastinum and the coronary arteries, including calcified
atherosclerotic plaque in the left main, left anterior descending,
left circumflex and right coronary arteries. No pathologically
enlarged mediastinal or hilar lymph nodes. Please note that accurate
exclusion of hilar adenopathy is limited on noncontrast CT scans.
Esophagus is unremarkable in appearance.

Lungs/Pleura: There are subtle noncalcified pleural plaques
throughout the thorax bilaterally. No definite calcified pleural
plaques are identified at this time. Mild diffuse bronchial wall
thickening with mild centrilobular and paraseptal emphysema, most
evident the lung apices bilaterally. In addition, there are patchy
areas of peripheral and peribronchovascular ground-glass attenuation
which are very mild, with some scattered areas of very mild
subpleural reticulation. No traction bronchiectasis or frank
honeycombing noted at this time. Inspiratory and expiratory images
demonstrate minimal air trapping in the lungs bilaterally. No
suspicious appearing pulmonary nodules or masses. No acute
consolidative airspace disease. No pleural effusions.

Upper Abdomen: 4.1 cm well-defined low-attenuation lesion in the
left lobe of the liver is incompletely characterized on today's
noncontrast CT examination, but was previously characterized as a
septated cyst on ultrasound examination 12/27/2013.

Musculoskeletal: There are no aggressive appearing lytic or blastic
lesions noted in the visualized portions of the skeleton.
IMPRESSION: 1. There are patchy areas of very mild ground-glass attenuation and
subpleural reticulation in the lungs bilaterally, which could
indicate interstitial lung disease such as nonspecific interstitial
pneumonia (NSIP). Repeat high-resolution chest CT in 1 year is
suggested to assess for temporal changes in the appearance of the
lung parenchyma.
2. There are multiple noncalcified pleural plaques bilaterally. No
calcified pleural plaques are noted. This is nonspecific, and could
relate to prior episodes of infection, or could be seen in the
setting of very early asbestos related pleural disease. Clinical
correlation for history of asbestos exposure is suggested. If there
is indeed a history of asbestos exposure, the findings in the lung
parenchyma could represent early changes of asbestosis.
3. No suspicious appearing pulmonary nodules or masses to suggest
metastatic disease.
4. Very mild air trapping indicative of mild small airways disease.
5. Atherosclerosis, including left main and 3 vessel coronary artery
disease. Please note that although the presence of coronary artery
calcium documents the presence of coronary artery disease, the
severity of this disease and any potential stenosis cannot be
assessed on this non-gated CT examination. Assessment for potential
risk factor modification, dietary therapy or pharmacologic therapy
may be warranted, if clinically indicated.
6. Additional incidental findings, as above.

## 2016-03-31 ENCOUNTER — Other Ambulatory Visit: Payer: Self-pay

## 2016-03-31 NOTE — Telephone Encounter (Signed)
Ranexa denial, forwarded to [facility] office where he is a patient

## 2016-04-08 ENCOUNTER — Other Ambulatory Visit: Payer: Self-pay

## 2016-04-08 MED ORDER — NITROGLYCERIN 0.4 MG SL SUBL: 0.4000 mg | SUBLINGUAL_TABLET | SUBLINGUAL | 5 refills | Status: DC | PRN

## 2016-04-08 NOTE — Telephone Encounter (Signed)
Refill sent in for nitro

## 2016-04-21 ENCOUNTER — Other Ambulatory Visit: Payer: Self-pay | Admitting: *Deleted

## 2016-04-21 MED ORDER — ISOSORBIDE MONONITRATE ER 60 MG PO TB24: 60.0000 mg | ORAL_TABLET | Freq: Every day | ORAL | 3 refills | Status: DC

## 2016-05-20 ENCOUNTER — Ambulatory Visit: Payer: Medicaid Other | Admitting: Neurology

## 2016-06-26 ENCOUNTER — Other Ambulatory Visit: Payer: Self-pay

## 2016-06-26 MED ORDER — NITROGLYCERIN 0.4 MG SL SUBL: 0.4000 mg | SUBLINGUAL_TABLET | SUBLINGUAL | 5 refills | Status: DC | PRN

## 2016-06-29 NOTE — Progress Notes (Signed)
Cardiology Office Note  Date: 06/30/2016   ID: [name] 1955/10/29, MRN [id number]  PCP: Judge Stall, [facility]  Primary Cardiologist: [name], MD   Chief Complaint  Patient presents with  . Coronary Artery Disease    History of Present Illness: [name] is a medically complex 60 y.o. male last seen in August. He presents for a routine follow-up visit. He is followed by [facility] with end-stage chronic lung disease. Continues to have intermittent follow-up with PCP. Reports chronic dyspnea exertion, uses oxygen continuously. Also having episodes of nausea and weakness. Intermittent angina as well as.  At the last visit we stopped Diovan and initiated Ranexa. He seems to have tolerated this so far. Do not plan to further advance Ranexa dose in light of concurrent use of Cardizem CD and his renal insufficiency.  Past Medical History:  Diagnosis Date  . Anxiety   . Arthritis   . Cholecystitis, acute 12/20/2013   Lap chole 6/5  . Cocaine abuse   . COPD (chronic obstructive pulmonary disease) ([place])   . Coronary atherosclerosis of native coronary artery    a. 03/09/2013 Cath/PCI: LM nl, LAD: 50p, 90m (2.5x16 promus DES), LCX nl, OM1 min irregs, LPL/LPDA diff dzs, RCA nondom, mod diff dzs, EF 55%.  . Depression   . Essential hypertension, benign   . GERD (gastroesophageal reflux disease)   . Headache   . Hepatitis Late 1970s  . History of nephrolithiasis   . History of pneumonia   . History of stroke    Right MCA distribution, residual left-sided weakness  . Hyperlipidemia   . Noncompliance   . Renal cell carcinoma [facility])    Status post radical right nephrectomy August 2015  . Sleep apnea    On CPAP, 4L O2 no cpap at home yet  . Type 2 diabetes mellitus ([place]) 2011    Current Outpatient Prescriptions  Medication Sig Dispense Refill  . albuterol (PROVENTIL HFA;VENTOLIN HFA) 108 (90 BASE) MCG/ACT inhaler Inhale 2 puffs into  the lungs every 6 (six) hours as needed for wheezing or shortness of breath.    [name] albuterol (PROVENTIL) (2.5 MG/3ML) 0.083% nebulizer solution Take 3 mLs (2.5 mg total) by nebulization every 3 (three) hours as needed for wheezing or shortness of breath. 75 mL 12  . alfuzosin (UROXATRAL) 10 MG 24 hr tablet Take 10 mg by mouth daily with breakfast.    . ALPRAZolam (XANAX) 0.5 MG tablet Take 0.5 mg by mouth 3 (three) times daily as needed for anxiety.    [name] atorvastatin (LIPITOR) 20 MG tablet Take 20 mg by mouth daily.    . budesonide-formoterol (SYMBICORT) 160-4.5 MCG/ACT inhaler Inhale 2 puffs into the lungs 2 (two) times daily. 1 Inhaler 3  . cholecalciferol (VITAMIN D) 1000 UNITS tablet Take 2,000 Units by mouth daily.    . citalopram (CELEXA) 10 MG tablet Take 10 mg by mouth daily.    . clopidogrel (PLAVIX) 75 MG tablet Take 1 tablet (75 mg total) by mouth daily. 30 tablet 11  . diltiazem (CARDIZEM CD) 240 MG 24 hr capsule Take 240 mg by mouth daily.    [name] FLUoxetine (PROZAC) 40 MG capsule Take 40 mg by mouth daily.    [name] gabapentin (NEURONTIN) 300 MG capsule Take 900 mg by mouth 3 (three) times daily. 1 cap in morning and 2 caps at night.    . insulin aspart (NOVOLOG) 100 UNIT/ML injection Inject 35 Units into the  skin 3 (three) times daily with meals.     . insulin glargine (LANTUS) 100 UNIT/ML injection Inject 0.4 mLs (40 Units total) into the skin daily. (Patient taking differently: Inject 100 Units into the skin 2 (two) times daily. ) 10 mL 11  . ipratropium-albuterol (DUONEB) 0.5-2.5 (3) MG/3ML SOLN Take 3 mLs by nebulization every 6 (six) hours as needed (Shortness of breath).    . isosorbide mononitrate (IMDUR) 60 MG 24 hr tablet Take 1 tablet (60 mg total) by mouth daily. 90 tablet 3  . nitroGLYCERIN (NITROSTAT) 0.4 MG SL tablet Place 1 tablet (0.4 mg total) under the tongue every 5 (five) minutes x 3 doses as needed for chest pain (if no relief after 3rd dose, proceed to the ED for an  evaluation). 25 tablet 5  . omeprazole (PRILOSEC) 40 MG capsule Take 40 mg by mouth daily.    . ondansetron (ZOFRAN) 8 MG tablet Take by mouth every 8 (eight) hours as needed for nausea or vomiting.    [name] oxyCODONE (OXYCONTIN) 20 mg 12 hr tablet Take 20 mg by mouth every 12 (twelve) hours.    [name] oxyCODONE-acetaminophen (PERCOCET) 10-325 MG tablet Take 1 tablet by mouth every 6 (six) hours as needed for pain.    . pantoprazole (PROTONIX) 40 MG tablet Take 40 mg by mouth daily.    . potassium chloride SA (K-DUR,KLOR-CON) 20 MEQ tablet Take 1 tablet (20 mEq total) by mouth daily. 90 tablet 3  . ranitidine (ZANTAC) 300 MG tablet Take 300 mg by mouth at bedtime.     . ranolazine (RANEXA) 500 MG 12 hr tablet Take 1 tablet (500 mg total) by mouth 2 (two) times daily. 60 tablet 3  . senna-docusate (SENOKOT-S) 8.6-50 MG tablet Take 1 tablet by mouth daily.    [name] torsemide (DEMADEX) 20 MG tablet Take 3 tablets (60 mg total) by mouth 2 (two) times daily. 180 tablet 6  . traMADol (ULTRAM) 50 MG tablet Take by mouth every 6 (six) hours as needed.    . valsartan (DIOVAN) 40 MG tablet Take 20 mg by mouth daily.    . solifenacin (VESICARE) 5 MG tablet Take 5 mg by mouth daily.     No current facility-administered medications for this visit.    Allergies:  Oxycodone   Social History: The patient  reports that he has been smoking Cigarettes.  He started smoking about 44 years ago. He has a 20.50 pack-year smoking history. He has never used smokeless tobacco. He reports that he does not drink alcohol or use drugs.   ROS:  Please see the history of present illness. Otherwise, complete review of systems is positive for chronic fatigue.  All other systems are reviewed and negative.   Physical Exam: VS:  BP (!) 142/92   Pulse 87   Ht 6' (1.829 m)   Wt 230 lb (104.3 kg)   SpO2 99%   BMI 31.19 kg/m , BMI Body mass index is 31.19 kg/m.  Wt Readings from Last 3 Encounters:  06/30/16 230 lb (104.3 kg)  03/05/16  255 lb 6.4 oz (115.8 kg)  12/27/15 260 lb (117.9 kg)    Obese, chronically ill-appearing male. Oxygen via nasal cannula. HEENT: Conjunctiva and lids normal, oropharynx clear.  Neck: Supple, elevated JVP, bilateral carotid bruits, no thyromegaly.  Lungs: Diminished breath sounds with rhonchi, nonlabored breathing at rest.  Cardiac: Regular rate and rhythm, no S3, 2/6 systolic murmur, no pericardial rub.  Abdomen: Protuberant, bowel sounds present, no guarding  or rebound.  Extremities: Improved leg edema, 2+, distal pulses 2+.   ECG: I personally reviewed the tracing from 07/23/2015 which showed sinus rhythm with left anterior fascicular block and decreased R wave progression.  Recent Labwork: 07/23/2015: ALT 22; AST 27; B Natriuretic Peptide 96.7 07/26/2015: BUN 30; Creatinine, Ser 1.68; Hemoglobin 13.1; Magnesium 2.0; Platelets 185; Potassium 5.3; Sodium 134     Component Value Date/Time   CHOL 128 12/19/2013 0220   TRIG 152 (H) 12/19/2013 0220   HDL 31 (L) 12/19/2013 0220   CHOLHDL 4.1 12/19/2013 0220   VLDL 30 12/19/2013 0220   LDLCALC 67 12/19/2013 0220    Other Studies Reviewed Today:  Cardiac catheterization 12/20/2013: Hemodynamic Findings: Central aortic pressure: 120/62 Left ventricular pressure: 127/7/15  Angiographic Findings:  Left main: Short segment. No obstructive disease.   Left Anterior Descending Artery: Moderate caliber vessel that courses to the apex. There is 40% proximal stenosis. The mid stented segment is patent without restenosis. The distal vessel has diffuse non-obstructive plaque. The diagonal branches are small in caliber with no obstructive disease.   Circumflex Artery: Large dominant system with large bifurcating first obtuse marginal branch and three small caliber posterolateral branches. The OM branch has diffuse 50% proximal stenosis, no focally obstructive lesions. The three small caliber posterolateral branches all have diffuse moderate  to severe stenosis that is unchanged from last cath.   Right Coronary Artery: Small non-dominant vessel with 40% mid vessel stenosis.   Left Ventricular Angiogram: LVEF=65%  Impression: 1. Double vessel CAD with patent stent mid LAD 2. Moderate non-obstructive disease in the Circumflex and RCA 3. Normal LV systolic function  Echocardiogram 07/05/2015: Study Conclusions  - Left ventricle: The cavity size was normal. Wall thickness was normal. Systolic function was normal. The estimated ejection fraction was in the range of 60% to 65%. Doppler parameters are consistent with abnormal left ventricular relaxation (grade 1 diastolic dysfunction).  Assessment and Plan:  1. Moderate multivessel CAD with patent LAD stent as of cardiac catheterization in 2015. He does have recurring angina, plan is to continue medical therapy however in light of medical complexity and Hospice care. No changes were made today.  2. CKD stage 3, last creatinine 1.8.  3. Chronic diastolic heart failure, he continues on Demadex. Leg edema has improved. He has lost a significant amount of weight in the last 6 months.  Current medicines were reviewed with the patient today.  Disposition: Follow-up in 6 months.  Signed, Satira Sark, MD, [facility] 06/30/2016 10:19 AM    [facility] [place], [place], [place] [address] Phone: [phone]; Fax: [phone]

## 2016-06-30 ENCOUNTER — Ambulatory Visit (INDEPENDENT_AMBULATORY_CARE_PROVIDER_SITE_OTHER): Payer: Medicaid Other | Admitting: Cardiology

## 2016-06-30 ENCOUNTER — Encounter: Payer: Self-pay | Admitting: Cardiology

## 2016-06-30 VITALS — BP 142/92 | HR 87 | Ht 72.0 in | Wt 230.0 lb

## 2016-06-30 DIAGNOSIS — I5032 Chronic diastolic (congestive) heart failure: Secondary | ICD-10-CM | POA: Diagnosis not present

## 2016-06-30 DIAGNOSIS — N183 Chronic kidney disease, stage 3 unspecified: Secondary | ICD-10-CM

## 2016-06-30 DIAGNOSIS — I25119 Atherosclerotic heart disease of native coronary artery with unspecified angina pectoris: Secondary | ICD-10-CM | POA: Diagnosis not present

## 2016-06-30 NOTE — Patient Instructions (Signed)

## 2016-07-09 ENCOUNTER — Other Ambulatory Visit: Payer: Self-pay | Admitting: Emergency Medicine

## 2016-07-15 ENCOUNTER — Other Ambulatory Visit: Payer: Self-pay | Admitting: Emergency Medicine

## 2016-07-15 ENCOUNTER — Telehealth: Payer: Self-pay | Admitting: Emergency Medicine

## 2016-07-15 NOTE — Telephone Encounter (Signed)
Rx refused. Patient has a new PCP.

## 2016-07-15 NOTE — Telephone Encounter (Signed)
Pt would like a refill for ranitidine 300mg  tab qty 90. Second time ive seen the refill but the PCP is not here at our office. Dr. [name] saw him once 01/28/2015. Please advise.

## 2016-08-12 ENCOUNTER — Other Ambulatory Visit: Payer: Self-pay | Admitting: *Deleted

## 2016-08-12 ENCOUNTER — Telehealth: Payer: Self-pay | Admitting: Cardiology

## 2016-08-12 MED ORDER — NITROGLYCERIN 0.4 MG SL SUBL: 0.4000 mg | SUBLINGUAL_TABLET | SUBLINGUAL | 3 refills | Status: DC | PRN

## 2016-08-12 MED ORDER — CLOPIDOGREL BISULFATE 75 MG PO TABS: 75.0000 mg | ORAL_TABLET | Freq: Every day | ORAL | 11 refills | Status: DC

## 2016-08-12 MED ORDER — NITROGLYCERIN 0.4 MG SL SUBL
0.4000 mg | SUBLINGUAL_TABLET | SUBLINGUAL | 3 refills | Status: DC | PRN
Start: 2016-08-12 — End: 2016-10-10

## 2016-08-12 NOTE — Telephone Encounter (Signed)
[facility], [place] - [address] [phone] (Phone) [phone] (Fax)   Needs clopidogrel (PLAVIX) 75 MG tablet  And  nitroGLYCERIN (NITROSTAT) 0.4 MG SL tablet    Refilled

## 2016-08-12 NOTE — Telephone Encounter (Signed)
Done

## 2016-08-18 ENCOUNTER — Encounter: Payer: Self-pay | Admitting: Cardiology

## 2016-10-10 ENCOUNTER — Other Ambulatory Visit: Payer: Self-pay | Admitting: Cardiology

## 2016-10-13 ENCOUNTER — Other Ambulatory Visit: Payer: Self-pay

## 2016-10-13 MED ORDER — NITROSTAT 0.4 MG SL SUBL: SUBLINGUAL_TABLET | SUBLINGUAL | 3 refills | Status: DC

## 2016-10-13 NOTE — Telephone Encounter (Signed)
Refilled ntg per fax request 

## 2016-10-28 ENCOUNTER — Telehealth: Payer: Self-pay | Admitting: Cardiology

## 2016-10-28 NOTE — Telephone Encounter (Signed)
Patient is having chest pain continuously throughout the night with increased shortness of breath. Patient states the pain is radiating down left arm. Patient said he is having swelling on left side from ankles all the way up to his arm. Patient stated he is taking nitro constantly. Advised patient he needed to go to the ER. Patient refused d/t being on hospice. Patient wants to know if Dr. Domenic Polite can do anything so he will be comfortable.

## 2016-10-28 NOTE — Telephone Encounter (Signed)
Complex patient with end-stage lung disease and Hospice care. From a cardiac perspective he is already on Cardizem CD, Imdur, and Ranexa for angina management. This may be a situation where use of pain control and anxiolytics may be of most benefit, and this should be something that Hospice is quite familiar with.

## 2016-10-28 NOTE — Telephone Encounter (Signed)
Mr. Fell called stating that he had chest pains all night with shortness of breath. States that he is on oxygen.  Under the care of Hospice.

## 2016-10-28 NOTE — Telephone Encounter (Signed)
Patient states he is already taking morphine and oxycodone for pain. Advised patient to contact hospice nurse to see if there was anything else they could do to help manage the pain better.

## 2016-11-23 ENCOUNTER — Other Ambulatory Visit: Payer: Self-pay | Admitting: Cardiology

## 2016-12-29 NOTE — Progress Notes (Signed)
Cardiology Office Note  Date: 12/30/2016   ID: Zai, Chmiel Dec 23, 1955, MRN [id number]  PCP: Judge Stall, [place]  Primary Cardiologist: [name], MD   Chief Complaint  Patient presents with  . Coronary Artery Disease    History of Present Illness: [name] is a 61 y.o. male last seen in December 2017. He presents for a routine follow-up visit. States that he has "good days and bad days."  He has end-stage chronic lung disease on oxygen, followed by Hospice and [place]. I reviewed his medications which are outlined below. He is on pain control measures with morphine.  Current cardiac regimen includes Demadex, Plavix, Cardizem CD, Imdur, and Ranexa. Further invasive cardiac evaluation is not being pursued. Still requires intermittent sublingual nitroglycerin for recurrent chest pain.  I personally reviewed his ECG today which shows sinus rhythm with left anterior fascicular block and low voltage in the limb leads.  Past Medical History:  Diagnosis Date  . Anxiety   . Arthritis   . Cholecystitis, acute 12/20/2013   Lap chole 6/5  . Cocaine abuse   . COPD (chronic obstructive pulmonary disease) ([place])   . Coronary atherosclerosis of native coronary artery    a. 03/09/2013 Cath/PCI: LM nl, LAD: 50p, 72m (2.5x16 promus DES), LCX nl, OM1 min irregs, LPL/LPDA diff dzs, RCA nondom, mod diff dzs, EF 55%.  . Depression   . Essential hypertension, benign   . GERD (gastroesophageal reflux disease)   . Headache   . Hepatitis Late 1970s  . History of nephrolithiasis   . History of pneumonia   . History of stroke    Right MCA distribution, residual left-sided weakness  . Hyperlipidemia   . Noncompliance   . Renal cell carcinoma [facility])    Status post radical right nephrectomy August 2015  . Sleep apnea    On CPAP, 4L O2 no cpap at home yet  . Type 2 diabetes mellitus ([place]) 2011    Past Surgical History:  Procedure Laterality Date  .  APPENDECTOMY  1970's  . CHOLECYSTECTOMY N/A 12/22/2013   Procedure: LAPAROSCOPIC CHOLECYSTECTOMY ;  Surgeon: [name], MD;  Location: [place];  Service: General;  Laterality: N/A;  . ENDARTERECTOMY Right 10/08/2014   Procedure: Right ENDARTERECTOMY CAROTID;  Surgeon: [name], MD;  Location: [place];  Service: Vascular;  Laterality: Right;  . LAPAROSCOPIC LYSIS OF ADHESIONS  02/21/2014   Procedure: LAPAROSCOPIC LYSIS OF ADHESIONS EXTINSIVE;  Surgeon: [name], MD;  Location: [facility];  Service: Urology;;  . LEFT HEART CATHETERIZATION WITH CORONARY ANGIOGRAM N/A 06/02/2012   Procedure: LEFT HEART CATHETERIZATION WITH CORONARY ANGIOGRAM;  Surgeon: [name], MD;  Location: [name] CATH LAB;  Service: Cardiovascular;  Laterality: N/A;  . LEFT HEART CATHETERIZATION WITH CORONARY ANGIOGRAM N/A 03/09/2013   Procedure: LEFT HEART CATHETERIZATION WITH CORONARY ANGIOGRAM;  Surgeon: [name], MD;  Location: [facility] CATH LAB;  Service: Cardiovascular;  Laterality: N/A;  . LEFT HEART CATHETERIZATION WITH CORONARY ANGIOGRAM N/A 12/20/2013   Procedure: LEFT HEART CATHETERIZATION WITH CORONARY ANGIOGRAM;  Surgeon: [name], MD;  Location: [facility] CATH LAB;  Service: Cardiovascular;  Laterality: N/A;  . LUNG BIOPSY Left 05/18/2014   Procedure: LUNG BIOPSY left upper lobe & left lower lobe;  Surgeon: [name], MD;  Location: [place];  Service: Thoracic;  Laterality: Left;  . PATCH ANGIOPLASTY Right 10/08/2014   Procedure: PATCH ANGIOPLASTY Right Carotid;  Surgeon: [name], MD;  Location: [facility]  OR;  Service: Vascular;  Laterality: Right;  . ROBOT ASSISTED LAPAROSCOPIC NEPHRECTOMY Right 02/21/2014   Procedure: ROBOTIC ASSISTED LAPAROSCOPIC RIGHT NEPHRECTOMY ;  Surgeon: [name], MD;  Location: [facility];  Service: Urology;  Laterality: Right;  [name] VIDEO ASSISTED THORACOSCOPY Left 05/18/2014   Procedure: LEFT VIDEO ASSISTED THORACOSCOPY;  Surgeon: [name], MD;  Location: [place];  Service: Thoracic;  Laterality: Left;  [name] VIDEO BRONCHOSCOPY  05/18/2014   Procedure: VIDEO BRONCHOSCOPY;  Surgeon: [name], MD;  Location: [place];  Service: Thoracic;;    Current Outpatient Prescriptions  Medication Sig Dispense Refill  . albuterol (PROVENTIL) (2.5 MG/3ML) 0.083% nebulizer solution Take 3 mLs (2.5 mg total) by nebulization every 3 (three) hours as needed for wheezing or shortness of breath. 75 mL 12  . ALPRAZolam (XANAX) 0.5 MG tablet Take 0.5 mg by mouth 3 (three) times daily as needed for anxiety.    . cholecalciferol (VITAMIN D) 1000 UNITS tablet Take 2,000 Units by mouth daily.    . clopidogrel (PLAVIX) 75 MG tablet Take 1 tablet (75 mg total) by mouth daily. 30 tablet 11  . diltiazem (CARDIZEM CD) 240 MG 24 hr capsule Take 240 mg by mouth daily.    . diphenhydrAMINE (BENADRYL) 25 mg capsule Take 50 mg by mouth at bedtime as needed.    [name] FLUoxetine (PROZAC) 40 MG capsule Take 40 mg by mouth daily.    [name] gabapentin (NEURONTIN) 300 MG capsule Take 900 mg by mouth 3 (three) times daily. 1 cap in morning and 2 caps at night.    . insulin aspart (NOVOLOG) 100 UNIT/ML injection Inject 35 Units into the skin 3 (three) times daily with meals.     . insulin glargine (LANTUS) 100 UNIT/ML injection Inject 0.4 mLs (40 Units total) into the skin daily. (Patient taking differently: Inject 120 Units into the skin 2 (two) times daily. ) 10 mL 11  . isosorbide mononitrate (IMDUR) 60 MG 24 hr tablet Take 1 tablet (60 mg total) by mouth daily. 90 tablet 3  . morphine (MS CONTIN) 15 MG 12 hr tablet Take 15 mg by mouth every 12 (twelve) hours. To be taken with the 30mg  tablet    . morphine (MS CONTIN) 30 MG 12 hr tablet Take 30 mg by mouth every 12 (twelve) hours. To be taken with the 15mg  tablet    . morphine 10 MG/5ML solution Take by mouth. 67ml by mouth every 6 hours    . NITROSTAT 0.4 MG SL tablet TAKE ONE TABLET UNDER THE TONGUE EVERY 5MINUTES FOR UP TO 3 DOSES AS NEEDED  FOR CHEST PAIN (IF NO RELIEF AFTER 3RD DOSE, PROCEED TO THE ED F 25 tablet 3  . omeprazole (PRILOSEC) 40 MG capsule Take 40 mg by mouth daily.    . ondansetron (ZOFRAN) 8 MG tablet Take by mouth every 8 (eight) hours as needed for nausea or vomiting.    [name] oxyCODONE-acetaminophen (PERCOCET) 10-325 MG tablet Take 1 tablet by mouth every 6 (six) hours as needed for pain.    . pantoprazole (PROTONIX) 40 MG tablet Take 40 mg by mouth daily.    . ranolazine (RANEXA) 500 MG 12 hr tablet Take 1 tablet (500 mg total) by mouth 2 (two) times daily. 60 tablet 3  . senna-docusate (SENOKOT-S) 8.6-50 MG tablet Take 1 tablet by mouth daily.    . tamsulosin (FLOMAX) 0.4 MG CAPS capsule Take 0.4 mg by mouth at bedtime.    . torsemide (DEMADEX) 20  MG tablet Take 3 tablets (60 mg total) by mouth 2 (two) times daily. 180 tablet 6   No current facility-administered medications for this visit.    Allergies:  Oxycodone   Social History: The patient  reports that he has been smoking Cigarettes.  He started smoking about 44 years ago. He has a 20.50 pack-year smoking history. He has never used smokeless tobacco. He reports that he does not drink alcohol or use drugs.   ROS:  Please see the history of present illness. Otherwise, complete review of systems is positive for chronic shortness of breath.  All other systems are reviewed and negative.   Physical Exam: VS:  BP 100/63   Pulse 84   Ht 6' (1.829 m)   Wt 239 lb (108.4 kg)   SpO2 94% Comment: 6 L/min 24/7  BMI 32.41 kg/m , BMI Body mass index is 32.41 kg/m.  Wt Readings from Last 3 Encounters:  12/30/16 239 lb (108.4 kg)  06/30/16 230 lb (104.3 kg)  03/05/16 255 lb 6.4 oz (115.8 kg)    Obese, chronically ill-appearing male. Oxygen via nasal cannula. HEENT: Conjunctiva and lids normal, oropharynx clear.  Neck: Supple, elevated JVP, bilateral carotid bruits, no thyromegaly.  Lungs: Diminished breath sounds with rhonchi, nonlabored breathing at rest.   Cardiac: Regular rate and rhythm, no S3, 2/6 systolic murmur, no pericardial rub.  Abdomen: Protuberant, bowel sounds present, no guarding or rebound.  Extremities: Improved leg edema, 2+, distal pulses 2+.  ECG: I personally reviewed the tracing from  07/23/2015 which showed sinus rhythm with left anterior fascicular block and poor R wave progression.  Recent Labwork:   July 2017: Potassium 4.0, BUN 30, creatinine 1.77  Other Studies Reviewed Today:  Cardiac catheterization 12/20/2013: Hemodynamic Findings: Central aortic pressure: 120/62 Left ventricular pressure: 127/7/15  Angiographic Findings:  Left main: Short segment. No obstructive disease.   Left Anterior Descending Artery: Moderate caliber vessel that courses to the apex. There is 40% proximal stenosis. The mid stented segment is patent without restenosis. The distal vessel has diffuse non-obstructive plaque. The diagonal branches are small in caliber with no obstructive disease.   Circumflex Artery: Large dominant system with large bifurcating first obtuse marginal branch and three small caliber posterolateral branches. The OM branch has diffuse 50% proximal stenosis, no focally obstructive lesions. The three small caliber posterolateral branches all have diffuse moderate to severe stenosis that is unchanged from last cath.   Right Coronary Artery: Small non-dominant vessel with 40% mid vessel stenosis.   Left Ventricular Angiogram: LVEF=65%  Impression: 1. Double vessel CAD with patent stent mid LAD 2. Moderate non-obstructive disease in the Circumflex and RCA 3. Normal LV systolic function  Echocardiogram 07/05/2015: Study Conclusions  - Left ventricle: The cavity size was normal. Wall thickness was normal. Systolic function was normal. The estimated ejection fraction was in the range of 60% to 65%. Doppler parameters are consistent with abnormal left ventricular relaxation (grade 1 diastolic  dysfunction).  Assessment and Plan:  1. CAD with angina being managed medically. Cardiac catheterization from 2015 is outlined above, LAD stent site was patent at that time. Plan to continue current cardiac regimen and observation.  2. Chronic diastolic heart failure, continues on Demadex. Weight has fluctuated. He reports compliance with medications.  3. End-stage chronic lung disease, now followed by Hospice and [place].  Current medicines were reviewed with the patient today.   Orders Placed This Encounter  Procedures  . EKG 12-Lead  Disposition: 6 month follow-up arranged.  Signed, Satira Sark, MD, [facility] 12/30/2016 1:32 PM    [facility] [place], [place], [place] [address] Phone: [phone]; Fax: [phone]

## 2016-12-30 ENCOUNTER — Ambulatory Visit (INDEPENDENT_AMBULATORY_CARE_PROVIDER_SITE_OTHER): Payer: Medicaid Other | Admitting: Cardiology

## 2016-12-30 ENCOUNTER — Encounter: Payer: Self-pay | Admitting: Cardiology

## 2016-12-30 VITALS — BP 100/63 | HR 84 | Ht 72.0 in | Wt 239.0 lb

## 2016-12-30 DIAGNOSIS — I25119 Atherosclerotic heart disease of native coronary artery with unspecified angina pectoris: Secondary | ICD-10-CM | POA: Diagnosis not present

## 2016-12-30 DIAGNOSIS — I5032 Chronic diastolic (congestive) heart failure: Secondary | ICD-10-CM

## 2016-12-30 DIAGNOSIS — J449 Chronic obstructive pulmonary disease, unspecified: Secondary | ICD-10-CM | POA: Diagnosis not present

## 2016-12-30 NOTE — Patient Instructions (Signed)

## 2017-01-01 ENCOUNTER — Other Ambulatory Visit: Payer: Self-pay | Admitting: Cardiology

## 2017-02-25 ENCOUNTER — Other Ambulatory Visit: Payer: Self-pay | Admitting: Cardiology

## 2017-03-15 ENCOUNTER — Telehealth: Payer: Self-pay | Admitting: Internal Medicine

## 2017-03-15 DIAGNOSIS — J849 Interstitial pulmonary disease, unspecified: Secondary | ICD-10-CM

## 2017-03-15 NOTE — Telephone Encounter (Signed)
[name]  I have not seen him in >  1 year. Last PFT fev 2016. Last CT dec 2016  Plan - Pre-bd spiro and dlco only. No lung volume or bd response. No post-bd spiro - High Resolution CT chest without contrast on ILD protocol. DO both Supine and Prone Images. Only  Dr Lorin Picket or Dr Salvatore Marvel Dr. Vinnie Langton to read.   - ROV with me after that ; first avail fine  Thanks  Dr. Brand Males, M.D., [facility].C.P Pulmonary and Critical Care Medicine Staff Physician [place] [facility] Pager: [phone], If no answer or between  15:00h - 7:00h: call [phone]  03/15/2017 2:53 AM

## 2017-03-18 NOTE — Telephone Encounter (Signed)
ATC pt on both numbers listed, line busy. WCB.

## 2017-03-19 NOTE — Telephone Encounter (Signed)
lmtcb for pt.  

## 2017-03-26 NOTE — Telephone Encounter (Signed)
Called and spoke with pt and he stated to me that he refuses to have another PFT performed due to ending up in the hospital after the last one he had performed in 02/2015.  Pt is okay with [place] scheduling him for a HRCT.   Will schedule that for him to have either Dr. Lorin Picket, Dr. Salvatore Marvel, or Dr. Vinnie Langton to read.  Once HRCT performed, will schedule pt for a ROV.  Nothing further needed at this time.

## 2017-03-31 NOTE — Telephone Encounter (Signed)
please schedule HRCT and OV Respect his decision to defer PFT  Dr. Brand Males, M.D., [facility].C.P Pulmonary and Critical Care Medicine Staff Physician [place] [facility] Pager: [phone], If no answer or between  15:00h - 7:00h: call [phone]  03/31/2017 10:36 PM

## 2017-04-01 ENCOUNTER — Inpatient Hospital Stay: Admission: RE | Admit: 2017-04-01 | Payer: Self-pay | Source: Ambulatory Visit

## 2017-04-08 ENCOUNTER — Encounter (HOSPITAL_COMMUNITY): Payer: Self-pay

## 2017-04-08 ENCOUNTER — Emergency Department (HOSPITAL_COMMUNITY)
Admission: EM | Admit: 2017-04-08 | Discharge: 2017-04-08 | Disposition: A | Discharge status: Home / Self Care | Payer: Medicaid Other | Attending: Emergency Medicine | Admitting: Emergency Medicine

## 2017-04-08 ENCOUNTER — Ambulatory Visit (INDEPENDENT_AMBULATORY_CARE_PROVIDER_SITE_OTHER)
Admission: RE | Admit: 2017-04-08 | Discharge: 2017-04-08 | Disposition: A | Discharge status: Home / Self Care | Payer: Medicaid Other | Source: Ambulatory Visit | Attending: Internal Medicine | Admitting: Internal Medicine

## 2017-04-08 DIAGNOSIS — T50901A Poisoning by unspecified drugs, medicaments and biological substances, accidental (unintentional), initial encounter: Secondary | ICD-10-CM

## 2017-04-08 DIAGNOSIS — Z794 Long term (current) use of insulin: Secondary | ICD-10-CM | POA: Insufficient documentation

## 2017-04-08 DIAGNOSIS — I129 Hypertensive chronic kidney disease with stage 1 through stage 4 chronic kidney disease, or unspecified chronic kidney disease: Secondary | ICD-10-CM | POA: Diagnosis not present

## 2017-04-08 DIAGNOSIS — Z85528 Personal history of other malignant neoplasm of kidney: Secondary | ICD-10-CM | POA: Diagnosis not present

## 2017-04-08 DIAGNOSIS — Z7902 Long term (current) use of antithrombotics/antiplatelets: Secondary | ICD-10-CM | POA: Insufficient documentation

## 2017-04-08 DIAGNOSIS — R0602 Shortness of breath: Secondary | ICD-10-CM | POA: Insufficient documentation

## 2017-04-08 DIAGNOSIS — F1410 Cocaine abuse, uncomplicated: Secondary | ICD-10-CM | POA: Insufficient documentation

## 2017-04-08 DIAGNOSIS — F419 Anxiety disorder, unspecified: Secondary | ICD-10-CM | POA: Insufficient documentation

## 2017-04-08 DIAGNOSIS — Z8673 Personal history of transient ischemic attack (TIA), and cerebral infarction without residual deficits: Secondary | ICD-10-CM | POA: Insufficient documentation

## 2017-04-08 DIAGNOSIS — Z9049 Acquired absence of other specified parts of digestive tract: Secondary | ICD-10-CM | POA: Diagnosis not present

## 2017-04-08 DIAGNOSIS — N183 Chronic kidney disease, stage 3 (moderate): Secondary | ICD-10-CM | POA: Diagnosis not present

## 2017-04-08 DIAGNOSIS — R5383 Other fatigue: Secondary | ICD-10-CM | POA: Diagnosis present

## 2017-04-08 DIAGNOSIS — R400 Somnolence: Secondary | ICD-10-CM | POA: Diagnosis not present

## 2017-04-08 DIAGNOSIS — F329 Major depressive disorder, single episode, unspecified: Secondary | ICD-10-CM | POA: Insufficient documentation

## 2017-04-08 DIAGNOSIS — E1122 Type 2 diabetes mellitus with diabetic chronic kidney disease: Secondary | ICD-10-CM | POA: Diagnosis not present

## 2017-04-08 DIAGNOSIS — Z79899 Other long term (current) drug therapy: Secondary | ICD-10-CM | POA: Insufficient documentation

## 2017-04-08 DIAGNOSIS — J849 Interstitial pulmonary disease, unspecified: Secondary | ICD-10-CM

## 2017-04-08 DIAGNOSIS — F17210 Nicotine dependence, cigarettes, uncomplicated: Secondary | ICD-10-CM | POA: Insufficient documentation

## 2017-04-08 DIAGNOSIS — I2510 Atherosclerotic heart disease of native coronary artery without angina pectoris: Secondary | ICD-10-CM | POA: Diagnosis not present

## 2017-04-08 DIAGNOSIS — T402X1A Poisoning by other opioids, accidental (unintentional), initial encounter: Secondary | ICD-10-CM | POA: Diagnosis not present

## 2017-04-08 LAB — BASIC METABOLIC PANEL
Anion gap: 8 (ref 5–15)
BUN: 12 mg/dL (ref 6–20)
CALCIUM: 8.1 mg/dL — AB (ref 8.9–10.3)
CO2: 27 mmol/L (ref 22–32)
CREATININE: 1.92 mg/dL — AB (ref 0.61–1.24)
Chloride: 101 mmol/L (ref 101–111)
GFR calc Af Amer: 42 mL/min — ABNORMAL LOW (ref >60)
GFR, EST NON AFRICAN AMERICAN: 36 mL/min — AB (ref >60)
GLUCOSE: 174 mg/dL — AB (ref 65–99)
Potassium: 3.4 mmol/L — ABNORMAL LOW (ref 3.5–5.1)
SODIUM: 136 mmol/L (ref 135–145)

## 2017-04-08 LAB — CBC
HEMATOCRIT: 35.2 % — AB (ref 39.0–52.0)
Hemoglobin: 10.9 g/dL — ABNORMAL LOW (ref 13.0–17.0)
MCH: 26.6 pg (ref 26.0–34.0)
MCHC: 31 g/dL (ref 30.0–36.0)
MCV: 85.9 fL (ref 78.0–100.0)
PLATELETS: 257 10*3/uL (ref 150–400)
RBC: 4.1 MIL/uL — ABNORMAL LOW (ref 4.22–5.81)
RDW: 15.1 % (ref 11.5–15.5)
WBC: 10 10*3/uL (ref 4.0–10.5)

## 2017-04-08 LAB — I-STAT TROPONIN, ED: TROPONIN I, POC: 0.01 ng/mL (ref 0.00–0.08)

## 2017-04-08 LAB — I-STAT VENOUS BLOOD GAS, ED
ACID-BASE EXCESS: 2 mmol/L (ref 0.0–2.0)
Bicarbonate: 30.5 mmol/L — ABNORMAL HIGH (ref 20.0–28.0)
O2 Saturation: 83 %
PCO2 VEN: 65.1 mmHg — AB (ref 44.0–60.0)
PO2 VEN: 55 mmHg — AB (ref 32.0–45.0)
TCO2: 32 mmol/L (ref 22–32)
pH, Ven: 7.279 (ref 7.250–7.430)

## 2017-04-08 MED ORDER — NALOXONE HCL 0.4 MG/ML IJ SOLN: 0.2000 mg | Freq: Once | INTRAMUSCULAR | Status: DC

## 2017-04-08 MED ORDER — NALOXONE HCL 0.4 MG/ML IJ SOLN
0.2000 mg | Freq: Once | INTRAMUSCULAR | Status: AC
  Administered 2017-04-08: 0.2 mg via INTRAVENOUS
  Filled 2017-04-08: qty 1

## 2017-04-08 MED ORDER — NALOXONE HCL 4 MG/0.1ML NA LIQD: 0.4000 mg | Freq: Once | NASAL | 0 refills | Status: DC

## 2017-04-08 MED ORDER — IPRATROPIUM-ALBUTEROL 0.5-2.5 (3) MG/3ML IN SOLN
3.0000 mL | Freq: Once | RESPIRATORY_TRACT | Status: AC
  Administered 2017-04-08: 3 mL via RESPIRATORY_TRACT
  Filled 2017-04-08: qty 3

## 2017-04-08 MED ORDER — NALOXONE HCL 0.4 MG/ML IJ SOLN
0.4000 mg | Freq: Once | INTRAMUSCULAR | Status: AC
  Administered 2017-04-08: 0.4 mg via INTRAVENOUS
  Filled 2017-04-08: qty 1

## 2017-04-08 NOTE — ED Notes (Signed)
Hospice nurse on call given update from this RN.

## 2017-04-08 NOTE — ED Notes (Signed)
Carla, Hospice nurse, called for an update.  Told her this RN was attempting to contact the pt's family for discharge.
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
## 2017-04-08 NOTE — ED Notes (Signed)
Pt woke up after narcan administration; asking for something to drink.  Given water with MD permission.
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
## 2017-04-08 NOTE — ED Notes (Signed)
Pt's husband called and informed of pt's status; will come pick up patient.

## 2017-04-08 NOTE — ED Notes (Signed)
[name], family, [phone]
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
## 2017-04-08 NOTE — Progress Notes (Signed)
[facility] RN note  Per Edyth Gunnels, RN with [facility] who spoke with Jarrett Soho, RN in ED pt received Narcan x 1 and will receive again before discharge. Bizarre behavior exhibited after Narcan administration. Will contact primary RN with [facility] to follow through and visit patient Friday 04/09/17.  Please call with any hospice related questions or concerns.  Thank you, Margaretmary Eddy, RN, [facility] Liaison [phone]

## 2017-04-08 NOTE — Care Management (Signed)
ED CM and CSW were consulted regarding patient yelling out some detailed concerning statements. Patient presented to [facility] ED for lethargy from PCP office patient is active with [facility] for the past 18 months with Lung Cancer.  ED CSW and ED CSW at bedside patient was yelling ' He is not my brother, he is my husband"  When asked who he referred to he stated "Lanny Hurst". Patient was asked by ED CSW how long had he been married? Patient then stated, " I was 61 yo when mama had him" ED CSW asked again how long had he been married? Patient stated, "I am not married" and then stated "Lanny Hurst is my brother" When asked about Hospice patient denies being a hospice patient.  ED CM received call from [place] with [facility] who verified patient as an active patient. She is not the patient's nurse so, she was not able to provide an actual baseline for patient. But he is an established patient with [facility] and and referral was placed for a nursing visit by [facility] in the am. Updated EDP and Jarrett Soho RN on [place] they are agreeable with disposition plan. No further ED CM needs identified.[name]

## 2017-04-16 ENCOUNTER — Other Ambulatory Visit: Payer: Self-pay | Admitting: Cardiology

## 2017-04-20 ENCOUNTER — Telehealth: Payer: Self-pay | Admitting: Internal Medicine

## 2017-04-20 NOTE — Telephone Encounter (Signed)
He had ct 04/08/17 and then appt but I think he got admitted and so currently no appt: please gie him first avail to discuss.  Dr. Brand Males, M.D., [facility].C.P Pulmonary and Critical Care Medicine Staff Physician [place] [facility] Pager: [phone], If no answer or between  15:00h - 7:00h: call [phone]  04/20/2017 9:53 AM

## 2017-04-20 NOTE — Telephone Encounter (Signed)
Called pt letting him know MR wanted him to come in for an OV. Pt scheduled Friday, 04/23/17 at 9:00.  Nothing further needed.

## 2017-04-23 ENCOUNTER — Ambulatory Visit (INDEPENDENT_AMBULATORY_CARE_PROVIDER_SITE_OTHER): Payer: Medicaid Other | Admitting: Internal Medicine

## 2017-04-23 ENCOUNTER — Encounter: Payer: Self-pay | Admitting: Internal Medicine

## 2017-04-23 VITALS — BP 128/60 | HR 92 | Ht 72.0 in | Wt 233.0 lb

## 2017-04-23 DIAGNOSIS — J849 Interstitial pulmonary disease, unspecified: Secondary | ICD-10-CM

## 2017-04-23 DIAGNOSIS — J9611 Chronic respiratory failure with hypoxia: Secondary | ICD-10-CM | POA: Diagnosis not present

## 2017-04-23 NOTE — Patient Instructions (Addendum)
ICD-10-CM   1. Interstitial pulmonary disease ([place]) J84.9   2. Chronic respiratory failure with hypoxia ([name]) J96.11     Clinically stable but compared to 18 months ago symptom burden higher  Plan Continue o2 Continue home hospice Continue support from family (mom) Continue o2 Try to cut down on opioiuds and anxiolytics   Followup  6 months or sooner

## 2017-04-23 NOTE — Progress Notes (Signed)
OV 04/10/2015  Chief Complaint  Patient presents with  . Follow-up    Pt states his breathing has worsened since last OV. Pt was admitted to [facility] for CP and syncope. Pt c/o increase in DOE, prod cough with dark brown mucus, chest disfomfort when active.    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 At that point in time spirometry showed a significant decline in forced vital capacity from 4 L at baseline to 2.6 L. He was using 4 L of oxygen at rest and 6 L with exertion at home. However we could not convincingly document hypoxemia with exertion on room air in the office at that time. Therefore to this and I ordered pulmonary function tests and CT scan of the chest. He did have CT scan of the chest in August 2016. There is no report on progression compared to February 2016 but I suspect the findings are similar. While he underwent pulmonary function test he had cough syncope and was admitted to the hospital. Cardiology notes reviewed and the final diagnosis was cough syncope. He he now presents for follow-up. He continues to be miserable with class IV dyspnea. He uses 4-6 liters of oxygen because he subjectively feels the need to do that. He does not monitor his pulse ox at home. He says that he continues to have cough syncope at home and has fallen several times. He has now moved to live with his 62 year old mom. He is socially isolated otherwise. He feels like he needs an extra layer of help at home. There are no other issues  Pulse oximetry on room air at rest and walking desaturation test 185 feet 3 laps on room air: RA rest after 15 min off o2, pulse o98% and lowest pulse ox with forehead probe was 90%. He did get dizzy with coughing   OV 06/05/15 61 year old man with tobacco abuse and history of interstitial lung disease, desquamative  interstitial pneumonitis and burnt out Langerhans' cell histiocytosis, hypoxemic respiratory failure; untreated OSA unable to tolerate CPAP. He probably also has some degree of superimposed COPD.  He presents today with two weeks of increased dyspnea, . He is having paroxysms of cough, can lead to pre-syncope / syncope. Last was 2 days ago. He has LE edema, probable more than his baseline. He is using duoneb bid, symbicort bid, albuterol prn - averages once a day. He complains of dry mouth. He took extra lasix after last OV here with TP, helped his edema but not his dyspnea.     OV 07/05/2015  Chief Complaint  Patient presents with  . Follow-up    Pt states his SOB has worsened since last OV. Pt also c/o increase in nonprod cough, pt states he frequently coughs when eating or drinking. Pt c/o right lateral chest pain when coughing. Pt denies f/c/s.   Admit    61yo male smoker with  ILD(DIP)  on Oxygen .  Polysubstance abuse hx.   08/09/2015 [facility] follow up  Patient returns for a post hospital follow-up. Patient was recently admitted with acute on chronic respiratory failure with underlying interstitial lung disease for  acute on chronic diastolic heart failure. He improved with diuresis .  He says he is feeling improved. Has decreased shortness of breath. Unfortunately continues to smoke. We discussed smoking cessation. He denies any hemoptysis, chest pain, orthopnea, PND, or increased leg  swelling.    OV 10/14/2015  Chief Complaint  Patient presents with  . Follow-up    Still having SOB, coughing and mild wheezing. Pt states that he feels like fluid is coming back especially on left side.    Follow-up interstitial lung disease and chronic hypoxemic respiratory failure associated with volume overload and diastolic dysfunction and coronary artery disease and multiple medical problems including depression and anxiety and poor social situation  He presents for routine follow-up.  During stable health times of November been able to document hypoxemia and him on room air at rest. Also during admissions he has been hypoxemic. Again today on room air 10 minutes his pulse ox was 97%. He insists that he gets very dyspneic when he turns his oxygen off and he needs 5 L of oxygen at all times. He does not check his pulse ox at home so we do not know his true oxygen levels when he is off oxygen. He has class III-for dyspnea. He is on oxygen. He lives with his mom was 62 years old and takes care of him. Overall he feels stable. [place] symptom assessment score documented below and it is obvious that is significant problems are fatigue, depression, anxiety and shortness of breath. He is open to undergo home palliative care program  [facility] Symptom Assessment Numerical Scale 0 is no problem -> 10 worst problem 10/14/2015    No Pain -> Worst pain 0  No Tiredness -> Worset tiredness 10  No Nausea -> Worst nausea 0  No Depression -> Worst depression 7  No Anxiety -> Worst Anxiety 8  No Drowsiness -> Worst Drowsiness 10  Best appetite-> Worst Appetitle 3  Best Feeling of well being -> Worst feeling 5  No dyspnea-> Worst dyspnea 8  Other problem (none -> severe) 10 - chest discomfort, when active  Completed by  patient         Last CT chest December 2016 and when compared to August 2016 no change in DIC pattern of ILD  Chest x-ray 10/04/2015: Reported as chronic changes or jaw baseline. I do not have these image for visualization  Lab work 07/26/2015: Shows a creatinine of 1.68 mg percent with a GFR of 43. Hemoglobin 13 g percent.  Cardiology visit with Dr. Domenic Polite 08/28/2015: There is discussion about switching his Lasix and metolazone and Demadex. Volume overload is likely multifactorial with RV dysfunction a significant component  Subjective:     Patient ID: [name], male   DOB: Nov 10, 1955, 61 y.o.   MRN: [id number]  HPI   OV 04/23/2017  Chief Complaint   Patient presents with  . Follow-up    Pt states that his breathing is worse than before. C/o SOB all the time, coughing, and chest pain. 6L O2 24/7.   Follow-up ILD chronic hypoxemic respiratory failure. He did have a high-resolution CT scan of the chest in September 2018. The shows mostly pulmonary edema and fluid pattern. I personally not seen in the last year and a half. He says that he continues to live with his mom. [facility] is visiting him 3 times a week and giving supportive care. He is on a lot of opioids and anxiolytics. He easily is nodding off in the exam room. He doesn't seem interested in giving me a good history. He has no insight into his disease. [place] symptom assessment score shows significant worsening of fatigue and pain and depression and overall high symptom burden.  [place]  Symptom Assessment Numerical Scale 0 is no problem -> 10 worst problem 10/14/2015   04/23/2017   No Pain -> Worst pain 0 7  No Tiredness -> Worset tiredness 10 9  No Nausea -> Worst nausea 0 0  No Depression -> Worst depression 7 10  No Anxiety -> Worst Anxiety 8 5  No Drowsiness -> Worst Drowsiness 10 3 (though was nodding off)  Best appetite-> Worst Appetitle 3 4  Best Feeling of well being -> Worst feeling 5 7  No dyspnea-> Worst dyspnea 8 8  Other problem (none -> severe) 10 - chest discomfort, when active 7 leg pain  Completed by  patient patient           has a past medical history of Anxiety; Arthritis; Cholecystitis, acute (12/20/2013); Cocaine abuse ([place]); COPD (chronic obstructive pulmonary disease) ([place]); Coronary atherosclerosis of native coronary artery; Depression; Essential hypertension, benign; GERD (gastroesophageal reflux disease); Headache; Hepatitis (Late 1970s); History of nephrolithiasis; History of pneumonia; History of stroke; Hyperlipidemia; Noncompliance; Renal cell carcinoma ([place]); Sleep apnea; and Type 2 diabetes mellitus ([place])  (2011).   reports that he has been smoking Cigarettes.  He started smoking about 45 years ago. He has a 20.50 pack-year smoking history. He has never used smokeless tobacco.  Past Surgical History:  Procedure Laterality Date  . APPENDECTOMY  1970's  . CHOLECYSTECTOMY N/A 12/22/2013   Procedure: LAPAROSCOPIC CHOLECYSTECTOMY ;  Surgeon: [name], MD;  Location: [place];  Service: General;  Laterality: N/A;  . ENDARTERECTOMY Right 10/08/2014   Procedure: Right ENDARTERECTOMY CAROTID;  Surgeon: [name], MD;  Location: [place];  Service: Vascular;  Laterality: Right;  . LAPAROSCOPIC LYSIS OF ADHESIONS  02/21/2014   Procedure: LAPAROSCOPIC LYSIS OF ADHESIONS EXTINSIVE;  Surgeon: [name], MD;  Location: [facility];  Service: Urology;;  . LEFT HEART CATHETERIZATION WITH CORONARY ANGIOGRAM N/A 06/02/2012   Procedure: LEFT HEART CATHETERIZATION WITH CORONARY ANGIOGRAM;  Surgeon: [name], MD;  Location: [facility] CATH LAB;  Service: Cardiovascular;  Laterality: N/A;  . LEFT HEART CATHETERIZATION WITH CORONARY ANGIOGRAM N/A 03/09/2013   Procedure: LEFT HEART CATHETERIZATION WITH CORONARY ANGIOGRAM;  Surgeon: [name], MD;  Location: [facility] LAB;  Service: Cardiovascular;  Laterality: N/A;  . LEFT HEART CATHETERIZATION WITH CORONARY ANGIOGRAM N/A 12/20/2013   Procedure: LEFT HEART CATHETERIZATION WITH CORONARY ANGIOGRAM;  Surgeon: [name], MD;  Location: [facility] CATH LAB;  Service: Cardiovascular;  Laterality: N/A;  . LUNG BIOPSY Left 05/18/2014   Procedure: LUNG BIOPSY left upper lobe & left lower lobe;  Surgeon: [name], MD;  Location: [place];  Service: Thoracic;  Laterality: Left;  . PATCH ANGIOPLASTY Right 10/08/2014   Procedure: PATCH ANGIOPLASTY Right Carotid;  Surgeon: [name], MD;  Location: [place];  Service: Vascular;  Laterality: Right;  . ROBOT ASSISTED LAPAROSCOPIC NEPHRECTOMY Right 02/21/2014   Procedure: ROBOTIC ASSISTED LAPAROSCOPIC RIGHT NEPHRECTOMY ;   Surgeon: [name], MD;  Location: [facility];  Service: Urology;  Laterality: Right;  [name] VIDEO ASSISTED THORACOSCOPY Left 05/18/2014   Procedure: LEFT VIDEO ASSISTED THORACOSCOPY;  Surgeon: [name], MD;  Location: [place];  Service: Thoracic;  Laterality: Left;  [name] VIDEO BRONCHOSCOPY  05/18/2014   Procedure: VIDEO BRONCHOSCOPY;  Surgeon: [name], MD;  Location: [place];  Service: Thoracic;;    Allergies  Allergen Reactions  . Oxycodone Itching and Nausea Only    Immunization History  Administered Date(s) Administered  . Influenza Split 04/04/2014, 03/27/2015  .  Pneumococcal Polysaccharide-23 11/04/2012    Family History  Problem Relation Age of Onset  . Cancer Mother        Thyroid - living in her 52's.  [name] Hypertension Unknown   . Diabetes Unknown   . Stroke Unknown   . Lung cancer Father   . CAD Father   . Cancer Maternal Grandmother        Breast  . Cancer Maternal Grandfather        Throat and stomach  . CAD Brother      Current Outpatient Prescriptions:  .  albuterol (PROVENTIL) (2.5 MG/3ML) 0.083% nebulizer solution, Take 3 mLs (2.5 mg total) by nebulization every 3 (three) hours as needed for wheezing or shortness of breath., Disp: 75 mL, Rfl: 12 .  ALPRAZolam (XANAX) 0.5 MG tablet, Take 0.5 mg by mouth 3 (three) times daily. , Disp: , Rfl:  .  atorvastatin (LIPITOR) 20 MG tablet, Take 20 mg by mouth daily., Disp: , Rfl:  .  clopidogrel (PLAVIX) 75 MG tablet, Take 1 tablet (75 mg total) by mouth daily., Disp: 30 tablet, Rfl: 11 .  diltiazem (CARDIZEM CD) 240 MG 24 hr capsule, Take 240 mg by mouth daily., Disp: , Rfl:  .  FLUoxetine (PROZAC) 40 MG capsule, Take 40 mg by mouth daily., Disp: , Rfl:  .  gabapentin (NEURONTIN) 300 MG capsule, Take 900 mg by mouth See admin instructions. Take 300 mg in the morning and at lunch. Take 600 mg at bedtime, Disp: , Rfl:  .  insulin aspart (NOVOLOG) 100 UNIT/ML injection, Inject 35 Units into the skin 3  (three) times daily with meals. , Disp: , Rfl:  .  isosorbide mononitrate (IMDUR) 60 MG 24 hr tablet, Take 1 tablet (60 mg total) by mouth daily., Disp: 90 tablet, Rfl: 3 .  methadone (DOLOPHINE) 10 MG tablet, Take 10 mg by mouth 3 (three) times daily., Disp: , Rfl:  .  morphine 10 MG/5ML solution, Take 10 mg by mouth every 6 (six) hours as needed for severe pain., Disp: , Rfl:  .  NITROSTAT 0.4 MG SL tablet, TAKE ONE TABLET UNDER THE TONGUE EVERY 5MINUTES FOR UP TO 3 DOSES AS NEEDED FOR CHEST PAIN (IF NO RELIEF AFTER 3RD DOSE, PROCEED TO THE ED F, Disp: 25 tablet, Rfl: 3 .  NITROSTAT 0.4 MG SL tablet, PLACE 1 TABLET UNDER TONGUE EVERY 5 MINUTES AS NEEDED FOR CHEST PAIN.IF 3 TABLETS ARE NECESSARY, CALL 911, Disp: 25 tablet, Rfl: 3 .  ondansetron (ZOFRAN) 8 MG tablet, Take by mouth every 8 (eight) hours as needed for nausea or vomiting., Disp: , Rfl:  .  oxyCODONE-acetaminophen (PERCOCET) 10-325 MG tablet, Take 1 tablet by mouth every 4 (four) hours as needed for pain. , Disp: , Rfl:  .  pantoprazole (PROTONIX) 40 MG tablet, Take 40 mg by mouth daily., Disp: , Rfl:  .  senna-docusate (SENOKOT-S) 8.6-50 MG tablet, Take 1 tablet by mouth daily., Disp: , Rfl:  .  sorbitol 70 % solution, Take 30 mLs by mouth daily as needed., Disp: , Rfl:  .  zolpidem (AMBIEN) 5 MG tablet, Take 5 mg by mouth at bedtime as needed for sleep., Disp: , Rfl:  .  naloxone (NARCAN) nasal spray 4 mg/0.1 mL, Place 0.1 sprays (0.4 mg total) into the nose once., Disp: 0.8 mg, Rfl: 0    Review of Systems     Objective:   Physical Exam Vitals:   04/23/17 0928  BP: 128/60  Pulse: 92  SpO2: 94%  Weight: 233 lb (105.7 kg)  Height: 6' (1.829 m)   Sitting in the chair oxygen on nodding off and sleep. Appears mildly drowsy but answers questions. He does not give a good history. Lungs are coarse no obvious wheezing or crackles. Chronic pedal edema present 1-2+. Abdomen is obese normal heart sounds not in distress     Assessment:       ICD-10-CM   1. Interstitial pulmonary disease ([place]) J84.9   2. Chronic respiratory failure with hypoxia ([name]) J96.11        Plan:      Clinically stable but compared to 18 months ago symptom burden higher  Plan Continue o2 Continue home hospice Continue support from family (mom) Continue o2 Try to cut down on opioiuds and anxiolytics   Followup  6 months or sooner  (> 50% of this 15 min visit spent in face to face counseling or/and coordination of care)    Dr. Brand Males, M.D., [facility].C.P Pulmonary and Critical Care Medicine Staff Physician [facility] Pager: [phone], If no answer or between  15:00h - 7:00h: call [phone]  04/23/2017 9:57 AM

## 2017-05-17 ENCOUNTER — Other Ambulatory Visit: Payer: Self-pay | Admitting: Cardiology

## 2017-05-24 ENCOUNTER — Telehealth: Payer: Self-pay

## 2017-05-24 NOTE — Telephone Encounter (Signed)
Difficult to be certain about the next step based on information provided.  I do not know the status of his recent renal function.  Would also wonder about whether he has any obstruction since catheter is not putting out much urine, presumably this has been checked.  If he is not responding to oral Demadex, IV Lasix might be a better choice in the short term.  I am not certain whether this can be arranged by hospice at home or not.  Would recommend the following:  1.  Hospice nurse should contact hospice physician and also PCP about this patient's status.  2.  Should make sure that urinary catheter is not obstructed. 3.  Consider checking BMET to assess renal function on current dose of Demadex. 4.  IV Lasix might be a possibility based on above.

## 2017-05-24 NOTE — Telephone Encounter (Signed)
Hospice nurse notified and verbalized understanding. Nurse stated she would consult with hospice physician and PCP to get a plan and would let us know.

## 2017-05-24 NOTE — Telephone Encounter (Signed)
LMTCB

## 2017-05-24 NOTE — Telephone Encounter (Signed)
Hospice nurse Jessca called stating on 10/22 patients weight was 222 lbs. In one week patient went up to 235 lbs. Hospice nurse placed a urinary catheter. Patient states catheter was draining very well at first, but now he is having very little output. Today, patient weighs 247 lbs. Nurse states abdomen is very hard and distended. Patient has severe edema in feet all the way up to his knees. Nurse states skin feels like an orange peel. Patient states his shortness of breath has increased and he is very weak. Patient currently taking Torsemide 60 mg BID.

## 2017-05-27 ENCOUNTER — Telehealth: Payer: Self-pay | Admitting: Cardiology

## 2017-05-27 NOTE — Telephone Encounter (Signed)
Dr. Truman Hayward notified of Dr. Chuck Hint recommendations from phone note on 11/5. Dr. Truman Hayward  verbalized understanding and stated she had results from BMET and they would try IV lasix. Asked PCP to have office fax over BMET results as well.

## 2017-05-27 NOTE — Telephone Encounter (Signed)
Dr. Truman Hayward from [facility] called regarding patient [name]. States that he is under the care of Hospice. He has had  a weight gain of 25 lbs since 05-24-17. Her concern for the patient is to make him comfortable. She wanted to check with Dr. Domenic Polite to see about an increase in his fluid pill.   Please call cell # [phone].

## 2017-05-30 ENCOUNTER — Other Ambulatory Visit (HOSPITAL_COMMUNITY)
Admit: 2017-05-30 | Discharge: 2017-05-30 | Disposition: A | Discharge status: Home / Self Care | Payer: Medicaid Other | Source: Hospice | Attending: Hematology | Admitting: Hematology

## 2017-05-30 DIAGNOSIS — I10 Essential (primary) hypertension: Secondary | ICD-10-CM | POA: Insufficient documentation

## 2017-05-30 DIAGNOSIS — I5033 Acute on chronic diastolic (congestive) heart failure: Secondary | ICD-10-CM | POA: Diagnosis present

## 2017-05-31 LAB — BASIC METABOLIC PANEL
Anion gap: 16 — ABNORMAL HIGH (ref 5–15)
Chloride: 91 mmol/L — ABNORMAL LOW (ref 101–111)
GFR calc Af Amer: 50 mL/min — ABNORMAL LOW (ref >60)
GFR calc non Af Amer: 43 mL/min — ABNORMAL LOW (ref >60)
Potassium: 3.8 mmol/L (ref 3.5–5.1)
Sodium: 138 mmol/L (ref 135–145)

## 2017-05-31 LAB — BASIC METABOLIC PANEL WITH GFR
BUN: 15 mg/dL (ref 6–20)
CO2: 31 mmol/L (ref 22–32)
Calcium: 9 mg/dL (ref 8.9–10.3)
Creatinine, Ser: 1.66 mg/dL — ABNORMAL HIGH (ref 0.61–1.24)
Glucose, Bld: 170 mg/dL — ABNORMAL HIGH (ref 65–99)

## 2017-06-05 ENCOUNTER — Other Ambulatory Visit: Payer: Self-pay | Admitting: Cardiology

## 2017-06-29 ENCOUNTER — Other Ambulatory Visit: Payer: Self-pay | Admitting: Cardiology

## 2017-06-30 ENCOUNTER — Ambulatory Visit: Payer: Medicaid Other | Admitting: Cardiology

## 2017-08-02 ENCOUNTER — Other Ambulatory Visit: Payer: Self-pay | Admitting: Cardiology

## 2017-08-09 NOTE — Progress Notes (Deleted)
Cardiology Office Note  Date: 08/09/2017   ID: Murel, Shenberger 1955-08-05, MRN [id number]  PCP: Judge Stall, FNP  Primary Cardiologist: [name], MD   No chief complaint on file.   History of Present Illness: ONDRE SALVETTI is a 62 y.o. male last seen in June 2018.  Past Medical History:  Diagnosis Date  . Anxiety   . Arthritis   . Cholecystitis, acute 12/20/2013   Lap chole 6/5  . Cocaine abuse ([place])   . COPD (chronic obstructive pulmonary disease) ([place])   . Coronary atherosclerosis of native coronary artery    a. 03/09/2013 Cath/PCI: LM nl, LAD: 50p, 55m (2.5x16 promus DES), LCX nl, OM1 min irregs, LPL/LPDA diff dzs, RCA nondom, mod diff dzs, EF 55%.  . Depression   . Essential hypertension, benign   . GERD (gastroesophageal reflux disease)   . Headache   . Hepatitis Late 1970s  . History of nephrolithiasis   . History of pneumonia   . History of stroke    Right MCA distribution, residual left-sided weakness  . Hyperlipidemia   . Noncompliance   . Renal cell carcinoma [facility])    Status post radical right nephrectomy August 2015  . Sleep apnea    On CPAP, 4L O2 no cpap at home yet  . Type 2 diabetes mellitus ([place]) 2011    Past Surgical History:  Procedure Laterality Date  . APPENDECTOMY  1970's  . CHOLECYSTECTOMY N/A 12/22/2013   Procedure: LAPAROSCOPIC CHOLECYSTECTOMY ;  Surgeon: [name], MD;  Location: [place];  Service: General;  Laterality: N/A;  . ENDARTERECTOMY Right 10/08/2014   Procedure: Right ENDARTERECTOMY CAROTID;  Surgeon: [name], MD;  Location: [place];  Service: Vascular;  Laterality: Right;  . LAPAROSCOPIC LYSIS OF ADHESIONS  02/21/2014   Procedure: LAPAROSCOPIC LYSIS OF ADHESIONS EXTINSIVE;  Surgeon: [name], MD;  Location[facility];  Service: Urology;;  . LEFT HEART CATHETERIZATION WITH CORONARY ANGIOGRAM N/A 06/02/2012   Procedure: LEFT HEART CATHETERIZATION WITH CORONARY ANGIOGRAM;  Surgeon: [name], MD;   Location: [facility] CATH LAB;  Service: Cardiovascular;  Laterality: N/A;  . LEFT HEART CATHETERIZATION WITH CORONARY ANGIOGRAM N/A 03/09/2013   Procedure: LEFT HEART CATHETERIZATION WITH CORONARY ANGIOGRAM;  Surgeon: [name], MD;  Location: [facility] CATH LAB;  Service: Cardiovascular;  Laterality: N/A;  . LEFT HEART CATHETERIZATION WITH CORONARY ANGIOGRAM N/A 12/20/2013   Procedure: LEFT HEART CATHETERIZATION WITH CORONARY ANGIOGRAM;  Surgeon: [name], MD;  Location: [facility] CATH LAB;  Service: Cardiovascular;  Laterality: N/A;  . LUNG BIOPSY Left 05/18/2014   Procedure: LUNG BIOPSY left upper lobe & left lower lobe;  Surgeon: [name], MD;  Location: [place];  Service: Thoracic;  Laterality: Left;  . PATCH ANGIOPLASTY Right 10/08/2014   Procedure: PATCH ANGIOPLASTY Right Carotid;  Surgeon: [name], MD;  Location: [place];  Service: Vascular;  Laterality: Right;  . ROBOT ASSISTED LAPAROSCOPIC NEPHRECTOMY Right 02/21/2014   Procedure: ROBOTIC ASSISTED LAPAROSCOPIC RIGHT NEPHRECTOMY ;  Surgeon: [name], MD;  [facility];  Service: Urology;  Laterality: Right;  [name] VIDEO ASSISTED THORACOSCOPY Left 05/18/2014   Procedure: LEFT VIDEO ASSISTED THORACOSCOPY;  Surgeon: [name], MD;  Location: [place];  Service: Thoracic;  Laterality: Left;  [name] VIDEO BRONCHOSCOPY  05/18/2014   Procedure: VIDEO BRONCHOSCOPY;  Surgeon: [name], MD;  Location: [place];  Service: Thoracic;;    Current Outpatient Medications  Medication Sig Dispense Refill  . albuterol (  PROVENTIL) (2.5 MG/3ML) 0.083% nebulizer solution Take 3 mLs (2.5 mg total) by nebulization every 3 (three) hours as needed for wheezing or shortness of breath. 75 mL 12  . ALPRAZolam (XANAX) 0.5 MG tablet Take 0.5 mg by mouth 3 (three) times daily.     [name] atorvastatin (LIPITOR) 20 MG tablet Take 20 mg by mouth daily.    . clopidogrel (PLAVIX) 75 MG tablet Take 1 tablet (75 mg total) by mouth daily. 30 tablet 11  .  diltiazem (CARDIZEM CD) 240 MG 24 hr capsule Take 240 mg by mouth daily.    [name] FLUoxetine (PROZAC) 40 MG capsule Take 40 mg by mouth daily.    [name] gabapentin (NEURONTIN) 300 MG capsule Take 900 mg by mouth See admin instructions. Take 300 mg in the morning and at lunch. Take 600 mg at bedtime    . insulin aspart (NOVOLOG) 100 UNIT/ML injection Inject 35 Units into the skin 3 (three) times daily with meals.     . isosorbide mononitrate (IMDUR) 60 MG 24 hr tablet TAKE ONE TABLET BY MOUTH EVERY DAY 90 tablet 0  . methadone (DOLOPHINE) 10 MG tablet Take 10 mg by mouth 3 (three) times daily.    [name] morphine 10 MG/5ML solution Take 10 mg by mouth every 6 (six) hours as needed for severe pain.    . naloxone (NARCAN) nasal spray 4 mg/0.1 mL Place 0.1 sprays (0.4 mg total) into the nose once. 0.8 mg 0  . NITROSTAT 0.4 MG SL tablet TAKE ONE TABLET UNDER THE TONGUE EVERY 5MINUTES FOR UP TO 3 DOSES AS NEEDED FOR CHEST PAIN (IF NO RELIEF AFTER 3RD DOSE, PROCEED TO THE ED F 25 tablet 3  . NITROSTAT 0.4 MG SL tablet PLACE 1 TABLET UNDER TONGUE EVERY 5 MINUTES AS NEEDED FOR CHEST PAIN.IF 3 TABLETS ARE NECESSARY, CALL 911 25 tablet 3  . NITROSTAT 0.4 MG SL tablet PLACE 1 TABLET UNDER TONGUE EVERY 5 MINUTES AS NEEDED FOR CHEST PAIN.IF 3 TABLETS ARE NECESSARY, CALL 911 25 tablet 3  . ondansetron (ZOFRAN) 8 MG tablet Take by mouth every 8 (eight) hours as needed for nausea or vomiting.    [name] oxyCODONE-acetaminophen (PERCOCET) 10-325 MG tablet Take 1 tablet by mouth every 4 (four) hours as needed for pain.     . pantoprazole (PROTONIX) 40 MG tablet Take 40 mg by mouth daily.    [name] senna-docusate (SENOKOT-S) 8.6-50 MG tablet Take 1 tablet by mouth daily.    . sorbitol 70 % solution Take 30 mLs by mouth daily as needed.    . torsemide (DEMADEX) 20 MG tablet TAKE THREE TABLETS BY MOUTH TWICE DAILY 180 tablet 0  . zolpidem (AMBIEN) 5 MG tablet Take 5 mg by mouth at bedtime as needed for sleep.     No current  facility-administered medications for this visit.    Allergies:  Oxycodone   Social History: The patient  reports that he has been smoking cigarettes.  He started smoking about 45 years ago. He has a 20.50 pack-year smoking history. he has never used smokeless tobacco. He reports that he does not drink alcohol or use drugs.   Family History: The patient's family history includes CAD in his brother and father; Cancer in his maternal grandfather, maternal grandmother, and mother; Diabetes in his unknown relative; Hypertension in his unknown relative; Lung cancer in his father; Stroke in his unknown relative.   ROS:  Please see the history of present illness. Otherwise, complete review of systems is  positive for {NONE DEFAULTED:18576::"none"}.  All other systems are reviewed and negative.   Physical Exam: VS:  There were no vitals taken for this visit., BMI There is no height or weight on file to calculate BMI.  Wt Readings from Last 3 Encounters:  04/23/17 233 lb (105.7 kg)  04/08/17 239 lb (108.4 kg)  12/30/16 239 lb (108.4 kg)    General: Patient appears comfortable at rest. HEENT: Conjunctiva and lids normal, oropharynx clear with moist mucosa. Neck: Supple, no elevated JVP or carotid bruits, no thyromegaly. Lungs: Clear to auscultation, nonlabored breathing at rest. Cardiac: Regular rate and rhythm, no S3 or significant systolic murmur, no pericardial rub. Abdomen: Soft, nontender, no hepatomegaly, bowel sounds present, no guarding or rebound. Extremities: No pitting edema, distal pulses 2+. Skin: Warm and dry. Musculoskeletal: No kyphosis. Neuropsychiatric: Alert and oriented x3, affect grossly appropriate.  ECG: I personally reviewed the tracing from 04/08/2017 which showed sinus rhythm with leftward axis and low voltage.  Recent Labwork: 04/08/2017: Hemoglobin 10.9; Platelets 257 05/30/2017: BUN 15; Creatinine, Ser 1.66; Potassium 3.8; Sodium 138   Other Studies Reviewed  Today:  Cardiac catheterization 12/20/2013: Hemodynamic Findings: Central aortic pressure: 120/62 Left ventricular pressure: 127/7/15  Angiographic Findings:  Left main: Short segment. No obstructive disease.   Left Anterior Descending Artery: Moderate caliber vessel that courses to the apex. There is 40% proximal stenosis. The mid stented segment is patent without restenosis. The distal vessel has diffuse non-obstructive plaque. The diagonal branches are small in caliber with no obstructive disease.   Circumflex Artery: Large dominant system with large bifurcating first obtuse marginal branch and three small caliber posterolateral branches. The OM branch has diffuse 50% proximal stenosis, no focally obstructive lesions. The three small caliber posterolateral branches all have diffuse moderate to severe stenosis that is unchanged from last cath.   Right Coronary Artery: Small non-dominant vessel with 40% mid vessel stenosis.   Left Ventricular Angiogram: LVEF=65%  Impression: 1. Double vessel CAD with patent stent mid LAD 2. Moderate non-obstructive disease in the Circumflex and RCA 3. Normal LV systolic function  Echocardiogram 07/05/2015: Study Conclusions  - Left ventricle: The cavity size was normal. Wall thickness was normal. Systolic function was normal. The estimated ejection fraction was in the range of 60% to 65%. Doppler parameters are consistent with abnormal left ventricular relaxation (grade 1 diastolic dysfunction).  Assessment and Plan:    Current medicines were reviewed with the patient today.  No orders of the defined types were placed in this encounter.   Disposition:  Signed, Satira Sark, MD, [facility] 08/09/2017 10:33 AM    [place] [facility] [place], [place], [place] [address] Phone: [phone]; Fax: [phone]

## 2017-08-10 ENCOUNTER — Ambulatory Visit: Payer: Medicaid Other | Admitting: Cardiology

## 2017-08-10 ENCOUNTER — Encounter: Payer: Self-pay | Admitting: *Deleted

## 2017-08-13 ENCOUNTER — Other Ambulatory Visit: Payer: Self-pay | Admitting: Cardiology

## 2017-08-31 ENCOUNTER — Other Ambulatory Visit: Payer: Self-pay | Admitting: Cardiology

## 2017-09-24 ENCOUNTER — Other Ambulatory Visit: Payer: Self-pay | Admitting: Cardiology

## 2017-10-16 ENCOUNTER — Other Ambulatory Visit: Payer: Self-pay | Admitting: Cardiology

## 2017-10-18 ENCOUNTER — Other Ambulatory Visit: Payer: Self-pay | Admitting: Cardiology

## 2017-10-26 ENCOUNTER — Ambulatory Visit: Payer: Medicaid Other | Admitting: Student

## 2017-11-22 ENCOUNTER — Other Ambulatory Visit: Payer: Self-pay | Admitting: Family Medicine

## 2017-11-22 DIAGNOSIS — R0789 Other chest pain: Secondary | ICD-10-CM

## 2017-11-23 ENCOUNTER — Other Ambulatory Visit: Payer: Self-pay | Admitting: Cardiology

## 2017-11-23 NOTE — Progress Notes (Deleted)
Cardiology Office Note  Date: 11/23/2017   ID: Child, [place] 26-Feb-1956, MRN [id number]  PCP: Judge Stall, FNP  Primary Cardiologist: [name], MD   No chief complaint on file.   History of Present Illness: [name] is a 62 y.o. male seen in June 2018.  He presents for a follow-up visit, continues to be managed by Hospice with end-stage chronic lung disease with chronic hypoxic respiratory failure.  Past Medical History:  Diagnosis Date  . Anxiety   . Arthritis   . Cholecystitis, acute 12/20/2013   Lap chole 6/5  . Cocaine abuse ([place])   . COPD (chronic obstructive pulmonary disease) ([place])   . Coronary atherosclerosis of native coronary artery    a. 03/09/2013 Cath/PCI: LM nl, LAD: 50p, 8m (2.5x16 promus DES), LCX nl, OM1 min irregs, LPL/LPDA diff dzs, RCA nondom, mod diff dzs, EF 55%.  . Depression   . Essential hypertension, benign   . GERD (gastroesophageal reflux disease)   . Headache   . Hepatitis Late 1970s  . History of nephrolithiasis   . History of pneumonia   . History of stroke    Right MCA distribution, residual left-sided weakness  . Hyperlipidemia   . Noncompliance   . Renal cell carcinoma [facility])    Status post radical right nephrectomy August 2015  . Sleep apnea    On CPAP, 4L O2 no cpap at home yet  . Type 2 diabetes mellitus ([place]) 2011    Past Surgical History:  Procedure Laterality Date  . APPENDECTOMY  1970's  . CHOLECYSTECTOMY N/A 12/22/2013   Procedure: LAPAROSCOPIC CHOLECYSTECTOMY ;  Surgeon: [name], MD;  Location: [place];  Service: General;  Laterality: N/A;  . ENDARTERECTOMY Right 10/08/2014   Procedure: Right ENDARTERECTOMY CAROTID;  Surgeon: [name], MD;  Location: [place];  Service: Vascular;  Laterality: Right;  . LAPAROSCOPIC LYSIS OF ADHESIONS  02/21/2014   Procedure: LAPAROSCOPIC LYSIS OF ADHESIONS EXTINSIVE;  Surgeon: [name], MD;  [facility];  Service: Urology;;  . LEFT HEART  CATHETERIZATION WITH CORONARY ANGIOGRAM N/A 06/02/2012   Procedure: LEFT HEART CATHETERIZATION WITH CORONARY ANGIOGRAM;  Surgeon: [name], MD;  Location: [facility] CATH LAB;  Service: Cardiovascular;  Laterality: N/A;  . LEFT HEART CATHETERIZATION WITH CORONARY ANGIOGRAM N/A 03/09/2013   Procedure: LEFT HEART CATHETERIZATION WITH CORONARY ANGIOGRAM;  Surgeon: [name], MD;  Location: [facility] CATH LAB;  Service: Cardiovascular;  Laterality: N/A;  . LEFT HEART CATHETERIZATION WITH CORONARY ANGIOGRAM N/A 12/20/2013   Procedure: LEFT HEART CATHETERIZATION WITH CORONARY ANGIOGRAM;  Surgeon: [name], MD;  Location: [facility] CATH LAB;  Service: Cardiovascular;  Laterality: N/A;  . LUNG BIOPSY Left 05/18/2014   Procedure: LUNG BIOPSY left upper lobe & left lower lobe;  Surgeon: [name], MD;  Location: [place];  Service: Thoracic;  Laterality: Left;  . PATCH ANGIOPLASTY Right 10/08/2014   Procedure: PATCH ANGIOPLASTY Right Carotid;  Surgeon: [name], MD;  Location: [place];  Service: Vascular;  Laterality: Right;  . ROBOT ASSISTED LAPAROSCOPIC NEPHRECTOMY Right 02/21/2014   Procedure: ROBOTIC ASSISTED LAPAROSCOPIC RIGHT NEPHRECTOMY ;  Surgeon: [name], MD;  [facility];  Service: Urology;  Laterality: Right;  [name] VIDEO ASSISTED THORACOSCOPY Left 05/18/2014   Procedure: LEFT VIDEO ASSISTED THORACOSCOPY;  Surgeon: [name], MD;  Location: [place];  Service: Thoracic;  Laterality: Left;  [name] VIDEO BRONCHOSCOPY  05/18/2014   Procedure: VIDEO BRONCHOSCOPY;  Surgeon: [name],  MD;  [facility];  Service: Thoracic;;    Current Outpatient Medications  Medication Sig Dispense Refill  . albuterol (PROVENTIL) (2.5 MG/3ML) 0.083% nebulizer solution Take 3 mLs (2.5 mg total) by nebulization every 3 (three) hours as needed for wheezing or shortness of breath. 75 mL 12  . ALPRAZolam (XANAX) 0.5 MG tablet Take 0.5 mg by mouth 3 (three) times daily.     [name]  atorvastatin (LIPITOR) 20 MG tablet Take 20 mg by mouth daily.    . clopidogrel (PLAVIX) 75 MG tablet TAKE ONE TABLET BY MOUTH EVERY DAY 30 tablet 1  . diltiazem (CARDIZEM CD) 240 MG 24 hr capsule Take 240 mg by mouth daily.    [name] FLUoxetine (PROZAC) 40 MG capsule Take 40 mg by mouth daily.    [name] gabapentin (NEURONTIN) 300 MG capsule Take 900 mg by mouth See admin instructions. Take 300 mg in the morning and at lunch. Take 600 mg at bedtime    . insulin aspart (NOVOLOG) 100 UNIT/ML injection Inject 35 Units into the skin 3 (three) times daily with meals.     . isosorbide mononitrate (IMDUR) 60 MG 24 hr tablet TAKE ONE TABLET BY MOUTH EVERY DAY 90 tablet 0  . methadone (DOLOPHINE) 10 MG tablet Take 10 mg by mouth 3 (three) times daily.    [name] morphine 10 MG/5ML solution Take 10 mg by mouth every 6 (six) hours as needed for severe pain.    . naloxone (NARCAN) nasal spray 4 mg/0.1 mL Place 0.1 sprays (0.4 mg total) into the nose once. 0.8 mg 0  . NITROSTAT 0.4 MG SL tablet TAKE ONE TABLET UNDER THE TONGUE EVERY 5MINUTES FOR UP TO 3 DOSES AS NEEDED FOR CHEST PAIN (IF NO RELIEF AFTER 3RD DOSE, PROCEED TO THE ED F 25 tablet 3  . NITROSTAT 0.4 MG SL tablet PLACE 1 TABLET UNDER TONGUE EVERY 5 MINUTES AS NEEDED FOR CHEST PAIN.IF 3 TABLETS ARE NECESSARY, CALL 911 25 tablet 3  . NITROSTAT 0.4 MG SL tablet PLACE 1 TABLET UNDER TONGUE EVERY 5 MINUTES AS NEEDED FOR CHEST PAIN.IF 3 TABLETS ARE NECESSARY, CALL 911 25 tablet 3  . ondansetron (ZOFRAN) 8 MG tablet Take by mouth every 8 (eight) hours as needed for nausea or vomiting.    [name] oxyCODONE-acetaminophen (PERCOCET) 10-325 MG tablet Take 1 tablet by mouth every 4 (four) hours as needed for pain.     . pantoprazole (PROTONIX) 40 MG tablet Take 40 mg by mouth daily.    [name] senna-docusate (SENOKOT-S) 8.6-50 MG tablet Take 1 tablet by mouth daily.    . sorbitol 70 % solution Take 30 mLs by mouth daily as needed.    . torsemide (DEMADEX) 20 MG tablet TAKE THREE TABLETS BY  MOUTH TWICE DAILY 180 tablet 0  . zolpidem (AMBIEN) 5 MG tablet Take 5 mg by mouth at bedtime as needed for sleep.     No current facility-administered medications for this visit.    Allergies:  Oxycodone   Social History: The patient  reports that he has been smoking cigarettes.  He started smoking about 45 years ago. He has a 20.50 pack-year smoking history. He has never used smokeless tobacco. He reports that he does not drink alcohol or use drugs.   Family History: The patient's family history includes CAD in his brother and father; Cancer in his maternal grandfather, maternal grandmother, and mother; Diabetes in his unknown relative; Hypertension in his unknown relative; Lung cancer in his father; Stroke in  his unknown relative.   ROS:  Please see the history of present illness. Otherwise, complete review of systems is positive for {NONE DEFAULTED:18576::"none"}.  All other systems are reviewed and negative.   Physical Exam: VS:  There were no vitals taken for this visit., BMI There is no height or weight on file to calculate BMI.  Wt Readings from Last 3 Encounters:  04/23/17 233 lb (105.7 kg)  04/08/17 239 lb (108.4 kg)  12/30/16 239 lb (108.4 kg)    General: Patient appears comfortable at rest. HEENT: Conjunctiva and lids normal, oropharynx clear with moist mucosa. Neck: Supple, no elevated JVP or carotid bruits, no thyromegaly. Lungs: Clear to auscultation, nonlabored breathing at rest. Cardiac: Regular rate and rhythm, no S3 or significant systolic murmur, no pericardial rub. Abdomen: Soft, nontender, no hepatomegaly, bowel sounds present, no guarding or rebound. Extremities: No pitting edema, distal pulses 2+. Skin: Warm and dry. Musculoskeletal: No kyphosis. Neuropsychiatric: Alert and oriented x3, affect grossly appropriate.  ECG: I personally reviewed the tracing from 12/30/2016 which showed sinus rhythm with nonspecific ST changes and left anterior fascicular  block.  Recent Labwork: 04/08/2017: Hemoglobin 10.9; Platelets 257 05/30/2017: BUN 15; Creatinine, Ser 1.66; Potassium 3.8; Sodium 138   Other Studies Reviewed Today:  Cardiac catheterization 12/20/2013: Hemodynamic Findings: Central aortic pressure: 120/62 Left ventricular pressure: 127/7/15  Angiographic Findings:  Left main: Short segment. No obstructive disease.   Left Anterior Descending Artery: Moderate caliber vessel that courses to the apex. There is 40% proximal stenosis. The mid stented segment is patent without restenosis. The distal vessel has diffuse non-obstructive plaque. The diagonal branches are small in caliber with no obstructive disease.   Circumflex Artery: Large dominant system with large bifurcating first obtuse marginal branch and three small caliber posterolateral branches. The OM branch has diffuse 50% proximal stenosis, no focally obstructive lesions. The three small caliber posterolateral branches all have diffuse moderate to severe stenosis that is unchanged from last cath.   Right Coronary Artery: Small non-dominant vessel with 40% mid vessel stenosis.   Left Ventricular Angiogram: LVEF=65%  Impression: 1. Double vessel CAD with patent stent mid LAD 2. Moderate non-obstructive disease in the Circumflex and RCA 3. Normal LV systolic function  Echocardiogram 07/05/2015: Study Conclusions  - Left ventricle: The cavity size was normal. Wall thickness was normal. Systolic function was normal. The estimated ejection fraction was in the range of 60% to 65%. Doppler parameters are consistent with abnormal left ventricular relaxation (grade 1 diastolic dysfunction).  Assessment and Plan:    Current medicines were reviewed with the patient today.  No orders of the defined types were placed in this encounter.   Disposition:  Signed, Satira Sark, MD, [facility] 11/23/2017 11:38 AM    [place] [facility] [place], [place], [place] [address] Phone: [phone]; Fax: [phone]

## 2017-11-25 ENCOUNTER — Ambulatory Visit: Payer: Medicaid Other | Admitting: Cardiology

## 2017-11-26 ENCOUNTER — Ambulatory Visit (HOSPITAL_BASED_OUTPATIENT_CLINIC_OR_DEPARTMENT_OTHER)
Admission: RE | Admit: 2017-11-26 | Discharge: 2017-11-26 | Disposition: A | Discharge status: Home / Self Care | Payer: Medicaid Other | Source: Ambulatory Visit | Attending: Family Medicine | Admitting: Family Medicine

## 2017-11-26 DIAGNOSIS — R0789 Other chest pain: Secondary | ICD-10-CM | POA: Diagnosis present

## 2017-11-26 DIAGNOSIS — J984 Other disorders of lung: Secondary | ICD-10-CM | POA: Insufficient documentation

## 2017-12-04 ENCOUNTER — Other Ambulatory Visit: Payer: Self-pay | Admitting: Cardiology

## 2017-12-08 ENCOUNTER — Ambulatory Visit: Payer: Medicaid Other | Admitting: Internal Medicine

## 2018-01-07 ENCOUNTER — Other Ambulatory Visit: Payer: Self-pay | Admitting: Cardiology

## 2018-01-12 ENCOUNTER — Other Ambulatory Visit: Payer: Self-pay | Admitting: Cardiology

## 2018-01-13 ENCOUNTER — Ambulatory Visit: Payer: Medicaid Other | Admitting: Cardiology

## 2018-01-13 ENCOUNTER — Encounter: Payer: Self-pay | Admitting: Cardiology

## 2018-01-13 VITALS — BP 92/59 | HR 81 | Ht 72.0 in | Wt 222.2 lb

## 2018-01-13 DIAGNOSIS — I25119 Atherosclerotic heart disease of native coronary artery with unspecified angina pectoris: Secondary | ICD-10-CM | POA: Diagnosis not present

## 2018-01-13 DIAGNOSIS — J449 Chronic obstructive pulmonary disease, unspecified: Secondary | ICD-10-CM | POA: Diagnosis not present

## 2018-01-13 DIAGNOSIS — R079 Chest pain, unspecified: Secondary | ICD-10-CM | POA: Diagnosis not present

## 2018-01-13 DIAGNOSIS — I5032 Chronic diastolic (congestive) heart failure: Secondary | ICD-10-CM | POA: Diagnosis not present

## 2018-01-13 NOTE — Patient Instructions (Signed)
Your physician wants you to follow-up in: 6 MONTHS WITH DR MCDOWELL You will receive a reminder letter in the mail two months in advance. If you don't receive a letter, please call our office to schedule the follow-up appointment.  Your physician recommends that you continue on your current medications as directed. Please refer to the Current Medication list given to you today.  Thank you for choosing [facility]!!    

## 2018-01-13 NOTE — Progress Notes (Signed)
Cardiology Office Note  Date: 01/13/2018   ID: Armond, Cuthrell 1955-12-15, MRN [id number]  PCP: Glenford Bayley, DO  Primary Cardiologist: [name], MD   Chief Complaint  Patient presents with  . Coronary Artery Disease    History of Present Illness: [name] is a 62 y.o. male last seen in June 2018.  He presents overdue for follow-up. I do not have complete information.  He had previously been in a hospice program due to end-stage lung disease.  He tells me that he was just recently admitted to [facility] with progressive decline, although apparently it was discovered that this was related to his pain medication dose which was adjusted, and he was discharged today.  I have no records for review.  He has stable, regular exertional angina that responds to nitroglycerin.  He tends to run a relatively low blood pressure which limits up titration of therapy.  Current cardiac regimen includes Plavix, Lipitor, Cardizem CD, and Imdur.  He is also on relatively high-dose Demadex for control of leg swelling.  He states that this had gotten particularly bad several months ago, although improved when he had a Foley catheter in place suggesting urinary obstruction.  I personally reviewed his ECG today which shows sinus rhythm with PAC and nonspecific ST changes.  Past Medical History:  Diagnosis Date  . Anxiety   . Arthritis   . Cholecystitis, acute 12/20/2013   Lap chole 6/5  . Cocaine abuse ([place])   . COPD (chronic obstructive pulmonary disease) ([place])   . Coronary atherosclerosis of native coronary artery    a. 03/09/2013 Cath/PCI: LM nl, LAD: 50p, 77m (2.5x16 promus DES), LCX nl, OM1 min irregs, LPL/LPDA diff dzs, RCA nondom, mod diff dzs, EF 55%.  . Depression   . Essential hypertension, benign   . GERD (gastroesophageal reflux disease)   . Headache   . Hepatitis Late 1970s  . History of nephrolithiasis   . History of pneumonia   . History of stroke    Right MCA  distribution, residual left-sided weakness  . Hyperlipidemia   . Noncompliance   . Renal cell carcinoma [facility])    Status post radical right nephrectomy August 2015  . Sleep apnea    On CPAP, 4L O2 no cpap at home yet  . Type 2 diabetes mellitus ([place]) 2011    Past Surgical History:  Procedure Laterality Date  . APPENDECTOMY  1970's  . CHOLECYSTECTOMY N/A 12/22/2013   Procedure: LAPAROSCOPIC CHOLECYSTECTOMY ;  Surgeon: [name], MD;  Location: [place];  Service: General;  Laterality: N/A;  . ENDARTERECTOMY Right 10/08/2014   Procedure: Right ENDARTERECTOMY CAROTID;  Surgeon: [name], MD;  Location: [place];  Service: Vascular;  Laterality: Right;  . LAPAROSCOPIC LYSIS OF ADHESIONS  02/21/2014   Procedure: LAPAROSCOPIC LYSIS OF ADHESIONS EXTINSIVE;  Surgeon: [name], MD;  Location: [facility];  Service: Urology;;  . LEFT HEART CATHETERIZATION WITH CORONARY ANGIOGRAM N/A 06/02/2012   Procedure: LEFT HEART CATHETERIZATION WITH CORONARY ANGIOGRAM;  Surgeon: [name], MD;  Location: [facility] CATH LAB;  Service: Cardiovascular;  Laterality: N/A;  . LEFT HEART CATHETERIZATION WITH CORONARY ANGIOGRAM N/A 03/09/2013   Procedure: LEFT HEART CATHETERIZATION WITH CORONARY ANGIOGRAM;  Surgeon: [name], MD;  Location: [facility] CATH LAB;  Service: Cardiovascular;  Laterality: N/A;  . LEFT HEART CATHETERIZATION WITH CORONARY ANGIOGRAM N/A 12/20/2013   Procedure: LEFT HEART CATHETERIZATION WITH CORONARY ANGIOGRAM;  Surgeon: [name], MD;  Location:  [place] CATH LAB;  Service: Cardiovascular;  Laterality: N/A;  . LUNG BIOPSY Left 05/18/2014   Procedure: LUNG BIOPSY left upper lobe & left lower lobe;  Surgeon: [name], MD;  Location: [place];  Service: Thoracic;  Laterality: Left;  . PATCH ANGIOPLASTY Right 10/08/2014   Procedure: PATCH ANGIOPLASTY Right Carotid;  Surgeon: [name], MD;  Location: [place];  Service: Vascular;  Laterality: Right;  . ROBOT ASSISTED LAPAROSCOPIC  NEPHRECTOMY Right 02/21/2014   Procedure: ROBOTIC ASSISTED LAPAROSCOPIC RIGHT NEPHRECTOMY ;  Surgeon: [name], MD;  Location: [facility];  Service: Urology;  Laterality: Right;  [name] VIDEO ASSISTED THORACOSCOPY Left 05/18/2014   Procedure: LEFT VIDEO ASSISTED THORACOSCOPY;  Surgeon: [name], MD;  Location: [place];  Service: Thoracic;  Laterality: Left;  [name] VIDEO BRONCHOSCOPY  05/18/2014   Procedure: VIDEO BRONCHOSCOPY;  Surgeon: [name], MD;  Location: [place];  Service: Thoracic;;    Current Outpatient Medications  Medication Sig Dispense Refill  . albuterol (PROVENTIL) (2.5 MG/3ML) 0.083% nebulizer solution Take 3 mLs (2.5 mg total) by nebulization every 3 (three) hours as needed for wheezing or shortness of breath. 75 mL 12  . ALPRAZolam (XANAX) 0.5 MG tablet Take 0.5 mg by mouth 3 (three) times daily.     [name] atorvastatin (LIPITOR) 20 MG tablet Take 20 mg by mouth daily.    . clopidogrel (PLAVIX) 75 MG tablet TAKE ONE TABLET BY MOUTH EVERY DAY 30 tablet 1  . clopidogrel (PLAVIX) 75 MG tablet TAKE ONE TABLET BY MOUTH EVERY DAY 90 tablet 0  . diltiazem (CARDIZEM CD) 240 MG 24 hr capsule Take 240 mg by mouth daily.    [name] FLUoxetine (PROZAC) 40 MG capsule Take 40 mg by mouth daily.    [name] gabapentin (NEURONTIN) 300 MG capsule Take 900 mg by mouth See admin instructions. Take 300 mg in the morning and at lunch. Take 600 mg at bedtime    . Insulin Aspart (NOVOLOG [place]) Inject 110 Units into the skin 2 (two) times daily. Novolog N    . isosorbide mononitrate (IMDUR) 60 MG 24 hr tablet TAKE ONE TABLET BY MOUTH EVERY DAY 90 tablet 0  . naloxone (NARCAN) nasal spray 4 mg/0.1 mL Place 0.1 sprays (0.4 mg total) into the nose once. 0.8 mg 0  . NITROSTAT 0.4 MG SL tablet TAKE ONE TABLET UNDER THE TONGUE EVERY 5MINUTES FOR UP TO 3 DOSES AS NEEDED FOR CHEST PAIN (IF NO RELIEF AFTER 3RD DOSE, PROCEED TO THE ED F 25 tablet 3  . ondansetron (ZOFRAN) 8 MG tablet Take by mouth every 8 (eight) hours  as needed for nausea or vomiting.    [name] oxyCODONE-acetaminophen (PERCOCET) 10-325 MG tablet Take 1 tablet by mouth every 4 (four) hours as needed for pain.     . pantoprazole (PROTONIX) 40 MG tablet Take 40 mg by mouth daily.    . Probiotic Product (PROBIOTIC DAILY PO) Take 1 capsule by mouth daily.    [name] senna-docusate (SENOKOT-S) 8.6-50 MG tablet Take 1 tablet by mouth daily.    . sorbitol 70 % solution Take 30 mLs by mouth daily as needed.    . torsemide (DEMADEX) 20 MG tablet TAKE THREE TABLETS BY MOUTH TWICE DAILY 180 tablet 0  . zolpidem (AMBIEN) 5 MG tablet Take 5 mg by mouth at bedtime as needed for sleep.    [name] morphine 10 MG/5ML solution Take 10 mg by mouth every 6 (six) hours as needed for severe pain.  No current facility-administered medications for this visit.    Allergies:  Oxycodone   Social History: The patient  reports that he has been smoking cigarettes.  He started smoking about 45 years ago. He has a 20.50 pack-year smoking history. He has never used smokeless tobacco. He reports that he does not drink alcohol or use drugs.   ROS:  Please see the history of present illness. Otherwise, complete review of systems is positive for chronic shortness of breath, NYHA class III.  All other systems are reviewed and negative.   Physical Exam: VS:  BP (!) 92/59   Pulse 81   Ht 6' (1.829 m)   Wt 222 lb 3.2 oz (100.8 kg)   SpO2 98% Comment: 6 L/min 24/7  BMI 30.14 kg/m , BMI Body mass index is 30.14 kg/m.  Wt Readings from Last 3 Encounters:  01/13/18 222 lb 3.2 oz (100.8 kg)  04/23/17 233 lb (105.7 kg)  04/08/17 239 lb (108.4 kg)    General: Chronically ill-appearing male in no distress wearing oxygen via nasal cannula. HEENT: Conjunctiva and lids normal, oropharynx clear. Neck: Supple, no elevated JVP or carotid bruits, no thyromegaly. Lungs: Coarse decreased breath sounds with prolonged expiratory phase.  Cardiac: Regular rate and rhythm, no S3 or significant systolic  murmur, no pericardial rub. Abdomen: Soft, nontender, bowel sounds present. Extremities: Chronic appearing mild lower leg edema and venous stasis, distal pulses 1-2+. Skin: Warm and dry. Musculoskeletal: No kyphosis. Neuropsychiatric: Alert and oriented x3, affect grossly appropriate.  ECG: I personally reviewed the tracing from 04/09/2017 which showed sinus rhythm with nonspecific ST segment abnormalities.  Recent Labwork: 04/08/2017: Hemoglobin 10.9; Platelets 257 05/30/2017: BUN 15; Creatinine, Ser 1.66; Potassium 3.8; Sodium 138     Component Value Date/Time   CHOL 128 12/19/2013 0220   TRIG 152 (H) 12/19/2013 0220   HDL 31 (L) 12/19/2013 0220   CHOLHDL 4.1 12/19/2013 0220   VLDL 30 12/19/2013 0220   LDLCALC 67 12/19/2013 0220    Other Studies Reviewed Today:  Cardiac catheterization 12/20/2013: Hemodynamic Findings: Central aortic pressure: 120/62 Left ventricular pressure: 127/7/15  Angiographic Findings:  Left main: Short segment. No obstructive disease.   Left Anterior Descending Artery: Moderate caliber vessel that courses to the apex. There is 40% proximal stenosis. The mid stented segment is patent without restenosis. The distal vessel has diffuse non-obstructive plaque. The diagonal branches are small in caliber with no obstructive disease.   Circumflex Artery: Large dominant system with large bifurcating first obtuse marginal branch and three small caliber posterolateral branches. The OM branch has diffuse 50% proximal stenosis, no focally obstructive lesions. The three small caliber posterolateral branches all have diffuse moderate to severe stenosis that is unchanged from last cath.   Right Coronary Artery: Small non-dominant vessel with 40% mid vessel stenosis.   Left Ventricular Angiogram: LVEF=65%  Impression: 1. Double vessel CAD with patent stent mid LAD 2. Moderate non-obstructive disease in the Circumflex and RCA 3. Normal LV systolic  function  Echocardiogram 07/05/2015: Study Conclusions  - Left ventricle: The cavity size was normal. Wall thickness was normal. Systolic function was normal. The estimated ejection fraction was in the range of 60% to 65%. Doppler parameters are consistent with abnormal left ventricular relaxation (grade 1 diastolic dysfunction).  Assessment and Plan:  1.  CAD with regular angina symptoms that are being managed medically in light of overall complex medical condition.  Cardiac catheterization in 2015 demonstrated patent LAD stent site with otherwise moderate nonobstructive disease.  No changes made to present regimen, low pressure limits up titration at this time.  ECG reviewed.  2.  End-stage chronic lung disease with chronic hypoxic respiratory failure.  He was previously in a hospice program and followed by pulmonary.  It sounds like he was discharged from the program today, I do not have further details.  I have asked him to contact the pulmonary division regarding follow-up and additional plans.  3.  Chronic diastolic heart failure.  Continue Demadex.  Weight has improved significantly.  It sounds like part of his fluid retention was related to urinary obstruction.  Current medicines were reviewed with the patient today.   Orders Placed This Encounter  Procedures  . EKG 12-Lead    Disposition: Follow-up in 6 months.  Signed, Satira Sark, MD, [facility] 01/13/2018 2:35 PM    [facility] [address], [place], [place] [address] Phone: [phone]; Fax: [phone]

## 2018-01-27 ENCOUNTER — Other Ambulatory Visit: Payer: Self-pay | Admitting: Cardiology

## 2018-02-01 ENCOUNTER — Other Ambulatory Visit: Payer: Self-pay | Admitting: *Deleted

## 2018-02-01 MED ORDER — CLOPIDOGREL BISULFATE 75 MG PO TABS: 75.0000 mg | ORAL_TABLET | Freq: Every day | ORAL | 3 refills | Status: DC

## 2018-02-01 MED ORDER — ISOSORBIDE MONONITRATE ER 60 MG PO TB24: 60.0000 mg | ORAL_TABLET | Freq: Every day | ORAL | 3 refills | Status: DC

## 2018-02-04 ENCOUNTER — Telehealth: Payer: Self-pay

## 2018-02-04 NOTE — Telephone Encounter (Signed)
Phone call placed to patient to schedule visit with Palliative Care. Scheduled for Monday 02/07/18.

## 2018-02-07 ENCOUNTER — Encounter: Payer: Self-pay | Admitting: Internal Medicine

## 2018-02-07 ENCOUNTER — Other Ambulatory Visit: Payer: Medicaid Other | Admitting: Internal Medicine

## 2018-02-07 DIAGNOSIS — J449 Chronic obstructive pulmonary disease, unspecified: Secondary | ICD-10-CM

## 2018-02-07 DIAGNOSIS — R0689 Other abnormalities of breathing: Principal | ICD-10-CM

## 2018-02-07 DIAGNOSIS — R0600 Dyspnea, unspecified: Secondary | ICD-10-CM

## 2018-02-07 DIAGNOSIS — Z515 Encounter for palliative care: Secondary | ICD-10-CM
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
## 2018-02-13 ENCOUNTER — Encounter (HOSPITAL_COMMUNITY): Payer: Self-pay

## 2018-02-13 ENCOUNTER — Emergency Department (HOSPITAL_COMMUNITY)
Admission: EM | Admit: 2018-02-13 | Discharge: 2018-02-14 | Disposition: A | Discharge status: Home / Self Care | Payer: Medicaid Other | Attending: Emergency Medicine | Admitting: Emergency Medicine

## 2018-02-13 DIAGNOSIS — Z7901 Long term (current) use of anticoagulants: Secondary | ICD-10-CM | POA: Diagnosis not present

## 2018-02-13 DIAGNOSIS — E119 Type 2 diabetes mellitus without complications: Secondary | ICD-10-CM | POA: Diagnosis not present

## 2018-02-13 DIAGNOSIS — Z79899 Other long term (current) drug therapy: Secondary | ICD-10-CM | POA: Insufficient documentation

## 2018-02-13 DIAGNOSIS — J449 Chronic obstructive pulmonary disease, unspecified: Secondary | ICD-10-CM | POA: Diagnosis not present

## 2018-02-13 DIAGNOSIS — I10 Essential (primary) hypertension: Secondary | ICD-10-CM | POA: Diagnosis not present

## 2018-02-13 DIAGNOSIS — R339 Retention of urine, unspecified: Secondary | ICD-10-CM | POA: Diagnosis not present

## 2018-02-13 DIAGNOSIS — R079 Chest pain, unspecified: Secondary | ICD-10-CM | POA: Diagnosis present

## 2018-02-13 LAB — BASIC METABOLIC PANEL
Anion gap: 13 (ref 5–15)
BUN: 16 mg/dL (ref 8–23)
CALCIUM: 8.3 mg/dL — AB (ref 8.9–10.3)
CO2: 24 mmol/L (ref 22–32)
CREATININE: 1.87 mg/dL — AB (ref 0.61–1.24)
Chloride: 97 mmol/L — ABNORMAL LOW (ref 98–111)
GFR calc Af Amer: 43 mL/min — ABNORMAL LOW (ref >60)
GFR calc non Af Amer: 37 mL/min — ABNORMAL LOW (ref >60)
GLUCOSE: 372 mg/dL — AB (ref 70–99)
POTASSIUM: 4.7 mmol/L (ref 3.5–5.1)
Sodium: 134 mmol/L — ABNORMAL LOW (ref 135–145)

## 2018-02-13 LAB — I-STAT TROPONIN, ED: TROPONIN I, POC: 0.01 ng/mL (ref 0.00–0.08)

## 2018-02-13 LAB — CBC
HCT: 36.5 % — ABNORMAL LOW (ref 39.0–52.0)
Hemoglobin: 11.6 g/dL — ABNORMAL LOW (ref 13.0–17.0)
MCH: 27 pg (ref 26.0–34.0)
MCHC: 31.8 g/dL (ref 30.0–36.0)
MCV: 84.9 fL (ref 78.0–100.0)
Platelets: 226 10*3/uL (ref 150–400)
RBC: 4.3 MIL/uL (ref 4.22–5.81)
RDW: 15.2 % (ref 11.5–15.5)
WBC: 9.4 10*3/uL (ref 4.0–10.5)

## 2018-02-13 LAB — HEPATIC FUNCTION PANEL
ALT: 8 U/L (ref 0–44)
AST: 30 U/L (ref 15–41)
Albumin: 3 g/dL — ABNORMAL LOW (ref 3.5–5.0)
Alkaline Phosphatase: 83 U/L (ref 38–126)
Bilirubin, Direct: 0.6 mg/dL — ABNORMAL HIGH (ref 0.0–0.2)
Indirect Bilirubin: 1 mg/dL — ABNORMAL HIGH (ref 0.3–0.9)
Total Bilirubin: 1.6 mg/dL — ABNORMAL HIGH (ref 0.3–1.2)
Total Protein: 6.3 g/dL — ABNORMAL LOW (ref 6.5–8.1)

## 2018-02-13 LAB — LIPASE, BLOOD: Lipase: 57 U/L — ABNORMAL HIGH (ref 11–51)

## 2018-02-13 NOTE — ED Provider Notes (Signed)
[facility] EMERGENCY DEPARTMENT Provider Note   CSN: [id number] Arrival date & time: 02/13/18  2215     History   Chief Complaint Chief Complaint  Patient presents with  . Abdominal Pain  . Chest Pain    HPI [name] is a 62 y.o. male with history of diabetes, lung cancer, COPD, CAD, cocaine abuse, renal cell carcinoma, urinary retention who presents with with inability to urinate for the past 2 days.  He reports he is only able to have small dribbles.  He has had significant lower abdominal pain since then.  He also reports ongoing chest pain typical of his normal for the past 2 to 3 years.  He has had associated shortness of breath.  He reports it is worse when he has more fluid retention.  He denies any new leg pain or swelling.  He is currently on torsemide.  He denies any fevers at home.  He denies any nausea, vomiting, bloody stools.  HPI  Past Medical History:  Diagnosis Date  . Anxiety   . Arthritis   . Cholecystitis, acute 12/20/2013   Lap chole 6/5  . Cocaine abuse ([place])   . COPD (chronic obstructive pulmonary disease) ([place])   . Coronary atherosclerosis of native coronary artery    a. 03/09/2013 Cath/PCI: LM nl, LAD: 50p, 50m (2.5x16 promus DES), LCX nl, OM1 min irregs, LPL/LPDA diff dzs, RCA nondom, mod diff dzs, EF 55%.  . Depression   . Essential hypertension, benign   . GERD (gastroesophageal reflux disease)   . Headache   . Hepatitis Late 1970s  . History of nephrolithiasis   . History of pneumonia   . History of stroke    Right MCA distribution, residual left-sided weakness  . Hyperlipidemia   . Noncompliance   . Renal cell carcinoma [facility])    Status post radical right nephrectomy August 2015  . Sleep apnea    On CPAP, 4L O2 no cpap at home yet  . Type 2 diabetes mellitus ([place]) 2011    Patient Active Problem List   Diagnosis Date Noted  . Acute respiratory failure ([place])   . Decreased mobility and endurance 07/19/2015  . CKD  (chronic kidney disease) stage 3, GFR 30-59 ml/min ([name]) 07/06/2015  . Acute on chronic respiratory failure ([place]) 07/05/2015  . COPD exacerbation ([place]) 07/05/2015  . Interstitial lung disease ([place]) 04/10/2015  . Cough syncope 04/10/2015  . Dyspnea 04/10/2015  . Atypical chest pain 02/28/2015  . Syncope 02/28/2015  . Orthostatic dizziness 02/25/2015  . Right leg pain 02/25/2015  . Right calf pain 02/14/2015  . Malaise and fatigue 02/14/2015  . 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 Smoking 11/12/2014  . History of stroke 10/24/2014  . Carotid disease, bilateral ([name]) 10/24/2014  . Orthostatic hypotension 10/24/2014  . Obstructive sleep apnea   . HLD (hyperlipidemia)   . Acute on chronic renal failure ([place])   . Renal cell carcinoma ([place])   . Metabolic encephalopathy   . Depression   . Anxiety state   . Noncompliance with medication regimen   . Carotid artery stenosis 10/08/2014  . Left sided numbness 09/26/2014  . Hyperreflexia 09/26/2014  . COLD (chronic obstructive lung disease) ([place]) 08/16/2014  . ILD (interstitial lung disease) ([place]) 08/16/2014  . Dizziness and giddiness 08/16/2014  . Other specified hypotension 08/16/2014  . Pulmonary edema   . Other emphysema ([place])   . Snoring 04/30/2014  . Chronic respiratory failure with hypoxia ([place]) 03/21/2014  .  Fluid overload 02/22/2014  . Renal neoplasm 02/21/2014  . Preoperative cardiovascular examination 02/07/2014  . Renal mass, right 12/21/2013  . Coronary atherosclerosis of native coronary artery   . Substance abuse ([place])   . Hyperlipidemia   . Diabetes mellitus type 2, uncontrolled, with cardiovascular complications 31/49/7026  . Tobacco abuse 06/01/2012  . H/O medication noncompliance 06/01/2012  . Cocaine abuse ([place]) 06/01/2012  . Essential hypertension, benign     Past Surgical History:  Procedure Laterality Date  . APPENDECTOMY  1970's  . CHOLECYSTECTOMY N/A 12/22/2013   Procedure:  LAPAROSCOPIC CHOLECYSTECTOMY ;  Surgeon: [name], MD;  Location: [facility];  Service: General;  Laterality: N/A;  . ENDARTERECTOMY Right 10/08/2014   Procedure: Right ENDARTERECTOMY CAROTID;  Surgeon: [name], MD;  Location: [facility];  Service: Vascular;  Laterality: Right;  . LAPAROSCOPIC LYSIS OF ADHESIONS  02/21/2014   Procedure: LAPAROSCOPIC LYSIS OF ADHESIONS EXTINSIVE;  Surgeon: [name], MD;  Location: [facility];  Service: Urology;;  . LEFT HEART CATHETERIZATION WITH CORONARY ANGIOGRAM N/A 06/02/2012   Procedure: LEFT HEART CATHETERIZATION WITH CORONARY ANGIOGRAM;  Surgeon: [name], MD;  Location: [facility] CATH LAB;  Service: Cardiovascular;  Laterality: N/A;  . LEFT HEART CATHETERIZATION WITH CORONARY ANGIOGRAM N/A 03/09/2013   Procedure: LEFT HEART CATHETERIZATION WITH CORONARY ANGIOGRAM;  Surgeon: [name], MD;  Location: [facility] CATH LAB;  Service: Cardiovascular;  Laterality: N/A;  . LEFT HEART CATHETERIZATION WITH CORONARY ANGIOGRAM N/A 12/20/2013   Procedure: LEFT HEART CATHETERIZATION WITH CORONARY ANGIOGRAM;  Surgeon: [name], MD;  Location: [facility] CATH LAB;  Service: Cardiovascular;  Laterality: N/A;  . LUNG BIOPSY Left 05/18/2014   Procedure: LUNG BIOPSY left upper lobe & left lower lobe;  Surgeon: [name], MD;  Location: [facility];  Service: Thoracic;  Laterality: Left;  . PATCH ANGIOPLASTY Right 10/08/2014   Procedure: PATCH ANGIOPLASTY Right Carotid;  Surgeon: [name], MD;  Location: [facility];  Service: Vascular;  Laterality: Right;  . ROBOT ASSISTED LAPAROSCOPIC NEPHRECTOMY Right 02/21/2014   Procedure: ROBOTIC ASSISTED LAPAROSCOPIC RIGHT NEPHRECTOMY ;  Surgeon: [name], MD;  Location: [facility];  Service: Urology;  Laterality: Right;  [name] VIDEO ASSISTED THORACOSCOPY Left 05/18/2014   Procedure: LEFT VIDEO ASSISTED THORACOSCOPY;  Surgeon: [name], MD;  Location: [facility];  Service: Thoracic;  Laterality: Left;  [name] VIDEO BRONCHOSCOPY   05/18/2014   Procedure: VIDEO BRONCHOSCOPY;  Surgeon: [name], MD;  Location: [facility];  Service: Thoracic;;        Home Medications    Prior to Admission medications   Medication Sig Start Date End Date Taking? Authorizing Provider  albuterol (PROVENTIL) (2.5 MG/3ML) 0.083% nebulizer solution Take 3 mLs (2.5 mg total) by nebulization every 3 (three) hours as needed for wheezing or shortness of breath. 02/27/14  Yes Nita Sells, MD  ALPRAZolam Duanne Moron) 0.5 MG tablet Take 0.5 mg by mouth 3 (three) times daily.    Yes [provider]  atorvastatin (LIPITOR) 20 MG tablet Take 20 mg by mouth daily.   Yes [provider]  clopidogrel (PLAVIX) 75 MG tablet Take 1 tablet (75 mg total) by mouth daily. 02/01/18  Yes Satira Sark, MD  diltiazem (CARDIZEM CD) 240 MG 24 hr capsule Take 240 mg by mouth daily.   Yes [provider]  FLUoxetine (PROZAC) 40 MG capsule Take 40 mg by mouth daily.   Yes [provider]  gabapentin (NEURONTIN) 300 MG capsule Take 900 mg by  mouth 3 (three) times daily.    Yes [provider]  insulin aspart (NOVOLOG) 100 UNIT/ML injection Inject 110 Units into the skin 2 (two) times daily.   Yes [provider]  isosorbide mononitrate (IMDUR) 60 MG 24 hr tablet Take 1 tablet (60 mg total) by mouth daily. 02/01/18  Yes Satira Sark, MD  morphine 10 MG/5ML solution Take 10 mg by mouth every 6 (six) hours as needed for severe pain.   Yes [provider]  naloxone (NARCAN) nasal spray 4 mg/0.1 mL Place 0.1 sprays (0.4 mg total) into the nose once. 04/08/17 01/14/19 Yes Aldona Lento, MD  NITROSTAT 0.4 MG SL tablet TAKE ONE TABLET UNDER THE TONGUE EVERY 5MINUTES FOR UP TO 3 DOSES AS NEEDED FOR CHEST PAIN (IF NO RELIEF AFTER 3RD DOSE, PROCEED TO THE ED F 10/13/16  Yes Satira Sark, MD  oxyCODONE-acetaminophen (PERCOCET) 10-325 MG tablet Take 1 tablet by mouth every 4 (four) hours as needed for  pain.    Yes [provider]  pantoprazole (PROTONIX) 40 MG tablet Take 40 mg by mouth daily.   Yes [provider]  Probiotic Product (PROBIOTIC DAILY PO) Take 1 capsule by mouth daily.   Yes [provider]  senna-docusate (SENOKOT-S) 8.6-50 MG tablet Take 1 tablet by mouth daily.   Yes [provider]  tamsulosin (FLOMAX) 0.4 MG CAPS capsule Take 0.4 mg by mouth daily.    Yes [provider]  torsemide (DEMADEX) 20 MG tablet TAKE THREE TABLETS BY MOUTH TWICE DAILY 01/27/18  Yes Satira Sark, MD  zolpidem (AMBIEN) 5 MG tablet Take 5 mg by mouth at bedtime as needed for sleep.   Yes [provider]    Family History Family History  Problem Relation Age of Onset  . Cancer Mother        Thyroid - living in her 29's.  [name] Hypertension Unknown   . Diabetes Unknown   . Stroke Unknown   . Lung cancer Father   . CAD Father   . Cancer Maternal Grandmother        Breast  . Cancer Maternal Grandfather        Throat and stomach  . CAD Brother     Social History Social History   Tobacco Use  . Smoking status: Current Some Day Smoker    Packs/day: 0.50    Years: 41.00    Pack years: 20.50    Types: Cigarettes    Start date: 05/03/1972  . Smokeless tobacco: Never Used  . Tobacco comment: 04/12/15 says smokes 2 per day, 1 in the AM and 1 in the PM  Substance Use Topics  . Alcohol use: No    Alcohol/week: 0.0 oz  . Drug use: No    Types: Cocaine    Comment: last used cocaine 11/2013. but is cocaine + this admit July 2015     Allergies   Oxycodone   Review of Systems Review of Systems  Constitutional: Negative for chills and fever.  HENT: Negative for facial swelling and sore throat.   Respiratory: Positive for shortness of breath.   Cardiovascular: Positive for chest pain and leg swelling (not bad today, per pt).  Gastrointestinal: Positive for abdominal pain. Negative for nausea and vomiting.  Genitourinary: Positive  for decreased urine volume and difficulty urinating. Negative for dysuria.  Musculoskeletal: Negative for back pain.  Skin: Negative for rash and wound.  Neurological: Negative for headaches.  Psychiatric/Behavioral: The patient is not  nervous/anxious.      Physical Exam Updated Vital Signs BP 137/79   Pulse 77   Resp 16   Ht 5\' 8"  (1.727 m)   Wt 107.8 kg (237 lb 9.6 oz)   SpO2 96%   BMI 36.13 kg/m   Physical Exam  Constitutional: He appears well-developed and well-nourished. No distress.  HENT:  Head: Normocephalic and atraumatic.  Mouth/Throat: Oropharynx is clear and moist. No oropharyngeal exudate.  Eyes: Pupils are equal, round, and reactive to light. Conjunctivae are normal. Right eye exhibits no discharge. Left eye exhibits no discharge. No scleral icterus.  Neck: Normal range of motion. Neck supple. No thyromegaly present.  Cardiovascular: Normal rate, regular rhythm, normal heart sounds and intact distal pulses. Exam reveals no gallop and no friction rub.  No murmur heard. Pulmonary/Chest: Effort normal. No stridor. No respiratory distress. He has no wheezes. He has rhonchi. He has no rales.  Abdominal: Soft. Bowel sounds are normal. He exhibits distension. There is tenderness in the suprapubic area. There is no rebound and no guarding.  Genitourinary: Testes normal and penis normal.  Musculoskeletal: He exhibits no edema.  Lymphadenopathy:    He has no cervical adenopathy.  Neurological: He is alert. Coordination normal.  Skin: Skin is warm and dry. No rash noted. He is not diaphoretic. No pallor.  Psychiatric: He has a normal mood and affect.  Nursing note and vitals reviewed.    ED Treatments / Results  Labs (all labs ordered are listed, but only abnormal results are displayed) Labs Reviewed  BASIC METABOLIC PANEL - Abnormal; Notable for the following components:      Result Value   Sodium 134 (*)    Chloride 97 (*)    Glucose, Bld 372 (*)    Creatinine,  Ser 1.87 (*)    Calcium 8.3 (*)    GFR calc non Af Amer 37 (*)    GFR calc Af Amer 43 (*)    All other components within normal limits  CBC - Abnormal; Notable for the following components:   Hemoglobin 11.6 (*)    HCT 36.5 (*)    All other components within normal limits  HEPATIC FUNCTION PANEL - Abnormal; Notable for the following components:   Total Protein 6.3 (*)    Albumin 3.0 (*)    Total Bilirubin 1.6 (*)    Bilirubin, Direct 0.6 (*)    Indirect Bilirubin 1.0 (*)    All other components within normal limits  LIPASE, BLOOD - Abnormal; Notable for the following components:   Lipase 57 (*)    All other components within normal limits  URINALYSIS, ROUTINE W REFLEX MICROSCOPIC - Abnormal; Notable for the following components:   Color, Urine COLORLESS (*)    Glucose, UA 150 (*)    Hgb urine dipstick SMALL (*)    Bacteria, UA RARE (*)    All other components within normal limits  URINE CULTURE  I-STAT TROPONIN, ED  I-STAT TROPONIN, ED    EKG EKG Interpretation  Date/Time:  Sunday February 13 2018 22:20:54 EDT Ventricular Rate:  79 PR Interval:    QRS Duration: 90 QT Interval:  377 QTC Calculation: 433 R Axis:   -168 Text Interpretation:  Sinus rhythm Atrial premature complex Probable lateral infarct, age indeterminate Low voltage QRS When compared with ECG of 07/23/2015, No significant change was found Confirmed by Delora Fuel ([id number]) on 02/13/2018 10:54:36 PM Also confirmed by Delora Fuel ([id number]), editor Oswaldo Milian, [place] ([id number])  on 02/14/2018 6:59:46 AM  Radiology Dg Chest 2 View  Result Date: 02/14/2018 CLINICAL DATA:  Chest/abdominal pain x3 weeks EXAM: CHEST - 2 VIEW COMPARISON:  CT chest dated 11/26/2017 FINDINGS: Lungs are clear.  No pleural effusion or pneumothorax. Cardiomegaly. Visualized osseous structures are within normal limits. IMPRESSION: Normal chest radiographs. Electronically Signed   By: Julian Hy M.D.   On: 02/14/2018 01:09     Procedures Procedures (including critical care time)  Medications Ordered in ED Medications - No data to display   Initial Impression / Assessment and Plan / ED Course  I have reviewed the triage vital signs and the nursing notes.  Pertinent labs & imaging results that were available during my care of the patient were reviewed by me and considered in my medical decision making (see chart for details).  Clinical Course as of Feb 15 755  Sun Feb 13, 2018  2356 Nursing reports >1000cc on bladder scan and attempted catheter placement without success.   [AL]  [id number] Dr. Roxanne Mins attempted passage of caude catheter without success.   [AL]    Clinical Course User Index [AL] Frederica Kuster, PA-C    Patient with urinary retention.  Labs are stable.  Urologist, Dr. Alinda Money, was able to pass a 12 French Foley catheter and he returned over 800 cc of clear urine.  UA sent negative for infection.  Patient's chronic chest pain and shortness of breath is unchanged.  Patient is not showing signs of fluid overload at this time.  Chest x-ray is negative.  Delta troponin negative.  EKG shows NSR with no significant change since last tracing.  Will have patient follow-up with urology and sent home with indwelling Foley catheter.  Return precautions discussed.  Patient understands and agrees with plan.  Patient vitals stable throughout ED course and discharged in satisfactory condition.  Patient also evaluated by Dr. Roxanne Mins who guided the patient's management and agrees with plan.  Final Clinical Impressions(s) / ED Diagnoses   Final diagnoses:  Urinary retention    ED Discharge Orders    None       Frederica Kuster, PA-C 29/52/84 1324    Delora Fuel, MD 40/10/27 [id number]    Delora Fuel, MD 64/40/34 [phone]

## 2018-02-13 NOTE — ED Notes (Signed)
Attempted to insert Coude Catheter with no success

## 2018-02-13 NOTE — ED Notes (Signed)
Coude attempted by MD without success

## 2018-02-13 NOTE — ED Triage Notes (Signed)
Pt brought in by [facility] from home for abdominal and chest pain x3 weeks. Per pt- was in hospice at [facility] where they had him on methodone, morphine, and percocet. Pt states he didn't know who he was or where he was while on all of these medications. Per pt his PCP was made aware of situation with meds- he ordered narcan and when given the narcan pt states in the confusion he ripped out his foley x3 weeks ago. Pt A+Ox4, c/o abdominal pain 10/10. Pt was on hospice for lung cancer x2 years, pt has hx of COPD.

## 2018-02-14 ENCOUNTER — Emergency Department (HOSPITAL_COMMUNITY): Payer: Medicaid Other

## 2018-02-14 LAB — I-STAT TROPONIN, ED: Troponin i, poc: 0.01 ng/mL (ref 0.00–0.08)

## 2018-02-14 LAB — URINALYSIS, ROUTINE W REFLEX MICROSCOPIC
Bilirubin Urine: NEGATIVE
Glucose, UA: 150 mg/dL — AB
Ketones, ur: NEGATIVE mg/dL
Leukocytes, UA: NEGATIVE
Nitrite: NEGATIVE
Protein, ur: NEGATIVE mg/dL
Specific Gravity, Urine: 1.005 (ref 1.005–1.030)
pH: 6 (ref 5.0–8.0)

## 2018-02-14 NOTE — Consult Note (Signed)
Urology Consult   Physician requesting consult: Dr. Roxanne Mins  Reason for consult: Difficult urethral catheterization  History of Present Illness: [name] is a 62 y.o. who has been followed by Dr. Phebe Colla and has undergone a nephrectomy for renal cell carcinoma.  He has been under Hospice care due to lung cancer. He presented to the ED with a two day history of dribbling urine and an inability to void with a good stream. He had pulled out his catheter with the balloon intact about 3 weeks ago.  His voiding difficulties have been progressive since despite alpha blocker therapy. In particular, he has noted a split stream since he pulled his catheter out.   In the ED, he was noted to have > 1 L PVR.  He has suprapubic discomfort.  He states that he had a catheter for about a year until he pulled the catheter out 3 weeks ago.  His catheter changes had been performed monthly and apparently were performed by nursing with coude catheters without difficulty.   Past Medical History:  Diagnosis Date  . Anxiety   . Arthritis   . Cholecystitis, acute 12/20/2013   Lap chole 6/5  . Cocaine abuse ([place])   . COPD (chronic obstructive pulmonary disease) ([place])   . Coronary atherosclerosis of native coronary artery    a. 03/09/2013 Cath/PCI: LM nl, LAD: 50p, 48m(2.5x16 promus DES), LCX nl, OM1 min irregs, LPL/LPDA diff dzs, RCA nondom, mod diff dzs, EF 55%.  . Depression   . Essential hypertension, benign   . GERD (gastroesophageal reflux disease)   . Headache   . Hepatitis Late 1970s  . History of nephrolithiasis   . History of pneumonia   . History of stroke    Right MCA distribution, residual left-sided weakness  . Hyperlipidemia   . Noncompliance   . Renal cell carcinoma ([facility]    Status post radical right nephrectomy August 2015  . Sleep apnea    On CPAP, 4L O2 no cpap at home yet  . Type 2 diabetes mellitus ([facility] 2011    Past Surgical History:  Procedure Laterality Date  . APPENDECTOMY   1970's  . CHOLECYSTECTOMY N/A 12/22/2013   Procedure: LAPAROSCOPIC CHOLECYSTECTOMY ;  Surgeon: DHarl Bowie MD;  [place]  Service: General;  Laterality: N/A;  . ENDARTERECTOMY Right 10/08/2014   Procedure: Right ENDARTERECTOMY CAROTID;  Surgeon: TRosetta Posner MD;  [place] [facility]  Service: Vascular;  Laterality: Right;  . LAPAROSCOPIC LYSIS OF ADHESIONS  02/21/2014   Procedure: LAPAROSCOPIC LYSIS OF ADHESIONS EXTINSIVE;  Surgeon: TAlexis Frock MD;  [facility];  Service: Urology;;  . LEFT HEART CATHETERIZATION WITH CORONARY ANGIOGRAM N/A 06/02/2012   Procedure: LEFT HEART CATHETERIZATION WITH CORONARY ANGIOGRAM;  Surgeon: THillary Bow MD;  [facility];  Service: Cardiovascular;  Laterality: N/A;  . LEFT HEART CATHETERIZATION WITH CORONARY ANGIOGRAM N/A 03/09/2013   Procedure: LEFT HEART CATHETERIZATION WITH CORONARY ANGIOGRAM;  Surgeon: MSherren Mocha MD;  [facility];  Service: Cardiovascular;  Laterality: N/A;  . LEFT HEART CATHETERIZATION WITH CORONARY ANGIOGRAM N/A 12/20/2013   Procedure: LEFT HEART CATHETERIZATION WITH CORONARY ANGIOGRAM;  Surgeon: CBurnell Blanks MD;  [facility];  Service: Cardiovascular;  Laterality: N/A;  . LUNG BIOPSY Left 05/18/2014   Procedure: LUNG BIOPSY left upper lobe & left lower lobe;  Surgeon: SMelrose Nakayama MD;  [place] [facility]  Service: Thoracic;  Laterality: Left;  . PATCH ANGIOPLASTY Right 10/08/2014  Procedure: PATCH ANGIOPLASTY Right Carotid;  Surgeon: [name], MD;  Location: [place];  Service: Vascular;  Laterality: Right;  . ROBOT ASSISTED LAPAROSCOPIC NEPHRECTOMY Right 02/21/2014   Procedure: ROBOTIC ASSISTED LAPAROSCOPIC RIGHT NEPHRECTOMY ;  Surgeon: [name], MD;  [facility];  Service: Urology;  Laterality: Right;  [name] VIDEO ASSISTED THORACOSCOPY Left 05/18/2014   Procedure: LEFT VIDEO ASSISTED THORACOSCOPY;  Surgeon: [name], MD;  Location: [place];  Service:  Thoracic;  Laterality: Left;  [name] VIDEO BRONCHOSCOPY  05/18/2014   Procedure: VIDEO BRONCHOSCOPY;  Surgeon: [name], MD;  Location: [place];  Service: Thoracic;;    Medications:  Home meds:  Current Meds  Medication Sig  . albuterol (PROVENTIL) (2.5 MG/3ML) 0.083% nebulizer solution Take 3 mLs (2.5 mg total) by nebulization every 3 (three) hours as needed for wheezing or shortness of breath.  . ALPRAZolam (XANAX) 0.5 MG tablet Take 0.5 mg by mouth 3 (three) times daily.   [name] atorvastatin (LIPITOR) 20 MG tablet Take 20 mg by mouth daily.  . clopidogrel (PLAVIX) 75 MG tablet Take 1 tablet (75 mg total) by mouth daily.  [name] diltiazem (CARDIZEM CD) 240 MG 24 hr capsule Take 240 mg by mouth daily.  [name] FLUoxetine (PROZAC) 40 MG capsule Take 40 mg by mouth daily.  [name] gabapentin (NEURONTIN) 300 MG capsule Take 900 mg by mouth 3 (three) times daily.   . insulin aspart (NOVOLOG) 100 UNIT/ML injection Inject 110 Units into the skin 2 (two) times daily.  . isosorbide mononitrate (IMDUR) 60 MG 24 hr tablet Take 1 tablet (60 mg total) by mouth daily.  [name] morphine 10 MG/5ML solution Take 10 mg by mouth every 6 (six) hours as needed for severe pain.  . naloxone (NARCAN) nasal spray 4 mg/0.1 mL Place 0.1 sprays (0.4 mg total) into the nose once.  [name] NITROSTAT 0.4 MG SL tablet TAKE ONE TABLET UNDER THE TONGUE EVERY 5MINUTES FOR UP TO 3 DOSES AS NEEDED FOR CHEST PAIN (IF NO RELIEF AFTER 3RD DOSE, PROCEED TO THE ED F  . oxyCODONE-acetaminophen (PERCOCET) 10-325 MG tablet Take 1 tablet by mouth every 4 (four) hours as needed for pain.   . pantoprazole (PROTONIX) 40 MG tablet Take 40 mg by mouth daily.  . Probiotic Product (PROBIOTIC DAILY PO) Take 1 capsule by mouth daily.  [name] senna-docusate (SENOKOT-S) 8.6-50 MG tablet Take 1 tablet by mouth daily.  . tamsulosin (FLOMAX) 0.4 MG CAPS capsule Take 0.4 mg by mouth daily.   [name] torsemide (DEMADEX) 20 MG tablet TAKE THREE TABLETS BY MOUTH TWICE DAILY  . zolpidem  (AMBIEN) 5 MG tablet Take 5 mg by mouth at bedtime as needed for sleep.    Scheduled Meds: Continuous Infusions: PRN Meds:.  Allergies:  Allergies  Allergen Reactions  . Oxycodone Itching and Nausea Only    Family History  Problem Relation Age of Onset  . Cancer Mother        Thyroid - living in her 72's.  [name] Hypertension Unknown   . Diabetes Unknown   . Stroke Unknown   . Lung cancer Father   . CAD Father   . Cancer Maternal Grandmother        Breast  . Cancer Maternal Grandfather        Throat and stomach  . CAD Brother     Social History:  reports that he has been smoking cigarettes.  He started smoking about 45 years ago. He has a 20.50 pack-year smoking history.  He has never used smokeless tobacco. He reports that he does not drink alcohol or use drugs.  ROS: A complete review of systems was performed.  All systems are negative except for pertinent findings as noted.  He has chronic shortness of breath.  Physical Exam:  Vital signs in last 24 hours: Pulse Rate:  [76-81] 76 (07/28 2345) Resp:  [18-35] 22 (07/28 2345) BP: (101-131)/(61-78) 101/66 (07/28 2345) SpO2:  [93 %-98 %] 95 % (07/28 2345) Weight:  [107.8 kg (237 lb 9.6 oz)] 107.8 kg (237 lb 9.6 oz) (07/28 2222) Constitutional:  Alert and oriented, No acute distress Cardiovascular:  No JVD Respiratory: Normal respiratory effort GI: Abdomen is obese and he has SP tenderness, no abdominal masses Genitourinary: No CVAT. Normal male phallus, testes are descended bilaterally and non-tender and without masses, scrotum is normal in appearance without lesions or masses, perineum is normal on inspection. Lymphatic: No lymphadenopathy Neurologic: Grossly intact, no focal deficits Psychiatric: Normal mood and affect  Laboratory Data:  Recent Labs    02/13/18 2224  WBC 9.4  HGB 11.6*  HCT 36.5*  PLT 226    Recent Labs    02/13/18 2224  NA 134*  K 4.7  CL 97*  GLUCOSE 372*  BUN 16  CALCIUM 8.3*   CREATININE 1.87*     Results for orders placed or performed during the hospital encounter of 02/13/18 (from the past 24 hour(s))  Basic metabolic panel     Status: Abnormal   Collection Time: 02/13/18 10:24 PM  Result Value Ref Range   Sodium 134 (L) 135 - 145 mmol/L   Potassium 4.7 3.5 - 5.1 mmol/L   Chloride 97 (L) 98 - 111 mmol/L   CO2 24 22 - 32 mmol/L   Glucose, Bld 372 (H) 70 - 99 mg/dL   BUN 16 8 - 23 mg/dL   Creatinine, Ser 1.87 (H) 0.61 - 1.24 mg/dL   Calcium 8.3 (L) 8.9 - 10.3 mg/dL   GFR calc non Af Amer 37 (L) >60 mL/min   GFR calc Af Amer 43 (L) >60 mL/min   Anion gap 13 5 - 15  CBC     Status: Abnormal   Collection Time: 02/13/18 10:24 PM  Result Value Ref Range   WBC 9.4 4.0 - 10.5 K/uL   RBC 4.30 4.22 - 5.81 MIL/uL   Hemoglobin 11.6 (L) 13.0 - 17.0 g/dL   HCT 36.5 (L) 39.0 - 52.0 %   MCV 84.9 78.0 - 100.0 fL   MCH 27.0 26.0 - 34.0 pg   MCHC 31.8 30.0 - 36.0 g/dL   RDW 15.2 11.5 - 15.5 %   Platelets 226 150 - 400 K/uL  Hepatic function panel     Status: Abnormal   Collection Time: 02/13/18 10:24 PM  Result Value Ref Range   Total Protein 6.3 (L) 6.5 - 8.1 g/dL   Albumin 3.0 (L) 3.5 - 5.0 g/dL   AST 30 15 - 41 U/L   ALT 8 0 - 44 U/L   Alkaline Phosphatase 83 38 - 126 U/L   Total Bilirubin 1.6 (H) 0.3 - 1.2 mg/dL   Bilirubin, Direct 0.6 (H) 0.0 - 0.2 mg/dL   Indirect Bilirubin 1.0 (H) 0.3 - 0.9 mg/dL  Lipase, blood     Status: Abnormal   Collection Time: 02/13/18 10:24 PM  Result Value Ref Range   Lipase 57 (H) 11 - 51 U/L  I-stat troponin, ED     Status: None   Collection Time:  02/13/18 10:33 PM  Result Value Ref Range   Troponin i, poc 0.01 0.00 - 0.08 ng/mL   Comment 3           No results found for this or any previous visit (from the past 240 hour(s)).  Renal Function: Recent Labs    02/13/18 2224  CREATININE 1.87*   Estimated Creatinine Clearance: 48.8 mL/min (A) (by C-G formula based on SCr of 1.87 mg/dL (H)).  Procedure:  Under  sterile conditions, I attempted placing a 20 Fr coude catheter but met resistance within the bulbar urethra.  I then attempted to place a 16 Fr coude again with resistance in the same area.  This was consistent with a probable stricture.  I then attempted a 12 Fr Foley catheter and was able to pass this catheter into the bladder with return of over 800cc clear urine.  Impression/Recommendation Difficult urethral catheterization: His catheter should be left indwelling.  He should follow up with Dr. Tresa Moore at [facility] in the next few weeks for further diagnostic evaluation.  Quoc Tome,LES 02/14/2018, 12:42 AM    Pryor Curia MD  CC: Dr. Roxanne Mins

## 2018-02-14 NOTE — ED Notes (Signed)
Urology at bedside.

## 2018-02-14 NOTE — ED Notes (Signed)
Urology Cart at bedside

## 2018-02-14 NOTE — Discharge Instructions (Addendum)
Please follow up with Dr. Tresa Moore, the urologist, in the next 1-2 weeks for further evaluation and treatment. Keep your catheter in place until then. Please return to the emergency department if you develop any new or worsening symptoms.

## 2018-02-14 NOTE — ED Notes (Signed)
[name] [phone] Pt Brother

## 2018-02-15 LAB — URINE CULTURE: Culture: NO GROWTH

## 2018-03-08 ENCOUNTER — Other Ambulatory Visit: Payer: Medicaid Other | Admitting: Nurse Practitioner

## 2018-03-08 DIAGNOSIS — J439 Emphysema, unspecified: Secondary | ICD-10-CM

## 2018-03-08 DIAGNOSIS — R6889 Other general symptoms and signs: Secondary | ICD-10-CM

## 2018-03-08 DIAGNOSIS — F419 Anxiety disorder, unspecified: Secondary | ICD-10-CM

## 2018-03-08 DIAGNOSIS — G8929 Other chronic pain: Secondary | ICD-10-CM

## 2018-03-08 DIAGNOSIS — Z515 Encounter for palliative care: Secondary | ICD-10-CM

## 2018-03-08 DIAGNOSIS — F339 Major depressive disorder, recurrent, unspecified: Secondary | ICD-10-CM

## 2018-03-09 ENCOUNTER — Encounter: Payer: Self-pay | Admitting: Nurse Practitioner

## 2018-03-09 NOTE — Progress Notes (Signed)
PALLIATIVE CARE CONSULT VISIT   PATIENT NAME: [name] DOB: 1956/06/16 MRN: [id number]  PRIMARY CARE PROVIDER:   Glenford Bayley, DO  REFERRING PROVIDER:  Glenford Bayley, DO [place], [place] [id number]  RESPONSIBLE PARTY:   Vadim Centola (mother) [phone] (home) [phone] (mobile)    ASSESSMENT/RECOMMENDATIONS :  COLD/Emphysema; oxygen dependent/OSA with CPAP  -current tobacco use  -exercise intolerance 2/2 lung disease -followed by pulmonologist -currently on 6L/min -PRN albuterol nebs -continues to smoke/patient not interested in quitting at this time  -has home health aids to assist with ADL's  Chronic Illness' managed by primary care MD and cardiologist  -CHF -CAD s/p PTCI/stent -Carotid stenosis s/p stent -angina -HTN/HLD -T2DM -GERD -BPH w urinary retention -insomnia -constipation -torsemide 20mg  TID -Imdur 60mg  daily -plavix 75mg  -PRN SL NTG -diltiazem CD 240mg  qd -lipitor 20mg  daily -novolog 110u BID -protonix -tamsulosin; currently has foley catheter; being followed by urology -Lorrin Mais 5mg  qhs PRN -senna 1 tablet daily  Chronic pain Depression Anxiety Unintentional overdose of narcotic's -oxycodone 10/325 Q4 PRN -morphine solution 10/83ml 10mg  every 6hrs -gabapentin 900mg  TID -xanax 0.5mg  TID -prozac 40mg  daily -narcan 4mg /0.51ml   ACP -DNR/MOST    I spent 30 minutes providing this consultation,  from 3:00 to 3:30. More than 50% of the time in this consultation was spent coordinating communication.   HISTORY OF PRESENT ILLNESS:  [name] is a 62 y.o. year old male with multiple medical problems including oxygen dependent COPD/emphysema, CAD, angina, HTN,HLD. Palliative Care was asked to help with symptom management, ongoing patient and family support and address goals of care.   CODE STATUS: DNR/MOST  PPS: 50% HOSPICE ELIGIBILITY/DIAGNOSIS: TBD  PAST MEDICAL HISTORY:  Past Medical History:  Diagnosis Date  .  Anxiety   . Arthritis   . Cholecystitis, acute 12/20/2013   Lap chole 6/5  . Cocaine abuse ([place])   . COPD (chronic obstructive pulmonary disease) ([place])   . Coronary atherosclerosis of native coronary artery    a. 03/09/2013 Cath/PCI: LM nl, LAD: 50p, 2m (2.5x16 promus DES), LCX nl, OM1 min irregs, LPL/LPDA diff dzs, RCA nondom, mod diff dzs, EF 55%.  . Depression   . Essential hypertension, benign   . GERD (gastroesophageal reflux disease)   . Headache   . Hepatitis Late 1970s  . History of nephrolithiasis   . History of pneumonia   . History of stroke    Right MCA distribution, residual left-sided weakness  . Hyperlipidemia   . Noncompliance   . Renal cell carcinoma [facility])    Status post radical right nephrectomy August 2015  . Sleep apnea    On CPAP, 4L O2 no cpap at home yet  . Type 2 diabetes mellitus ([place]) 2011    SOCIAL HX:  Social History   Tobacco Use  . Smoking status: Current Some Day Smoker    Packs/day: 0.50    Years: 41.00    Pack years: 20.50    Types: Cigarettes    Start date: 05/03/1972  . Smokeless tobacco: Never Used  . Tobacco comment: 04/12/15 says smokes 2 per day, 1 in the AM and 1 in the PM  Substance Use Topics  . Alcohol use: No    Alcohol/week: 0.0 standard drinks    ALLERGIES:  Allergies  Allergen Reactions  . Oxycodone Itching and Nausea Only     PERTINENT MEDICATIONS:  Outpatient Encounter Medications as of 03/08/2018  Medication Sig  . albuterol (PROVENTIL) (2.5  MG/3ML) 0.083% nebulizer solution Take 3 mLs (2.5 mg total) by nebulization every 3 (three) hours as needed for wheezing or shortness of breath.  . ALPRAZolam (XANAX) 0.5 MG tablet Take 0.5 mg by mouth 3 (three) times daily.   [name] atorvastatin (LIPITOR) 20 MG tablet Take 20 mg by mouth daily.  . clopidogrel (PLAVIX) 75 MG tablet Take 1 tablet (75 mg total) by mouth daily.  [name] diltiazem (CARDIZEM CD) 240 MG 24 hr capsule Take 240 mg by mouth daily.  [name] FLUoxetine (PROZAC) 40 MG  capsule Take 40 mg by mouth daily.  [name] gabapentin (NEURONTIN) 300 MG capsule Take 900 mg by mouth 3 (three) times daily.   . insulin aspart (NOVOLOG) 100 UNIT/ML injection Inject 110 Units into the skin 2 (two) times daily.  . isosorbide mononitrate (IMDUR) 60 MG 24 hr tablet Take 1 tablet (60 mg total) by mouth daily.  [name] morphine 10 MG/5ML solution Take 10 mg by mouth every 6 (six) hours as needed for severe pain.  . naloxone (NARCAN) nasal spray 4 mg/0.1 mL Place 0.1 sprays (0.4 mg total) into the nose once.  [name] NITROSTAT 0.4 MG SL tablet TAKE ONE TABLET UNDER THE TONGUE EVERY 5MINUTES FOR UP TO 3 DOSES AS NEEDED FOR CHEST PAIN (IF NO RELIEF AFTER 3RD DOSE, PROCEED TO THE ED F  . oxyCODONE-acetaminophen (PERCOCET) 10-325 MG tablet Take 1 tablet by mouth every 4 (four) hours as needed for pain.   . pantoprazole (PROTONIX) 40 MG tablet Take 40 mg by mouth daily.  . Probiotic Product (PROBIOTIC DAILY PO) Take 1 capsule by mouth daily.  [name] senna-docusate (SENOKOT-S) 8.6-50 MG tablet Take 1 tablet by mouth daily.  . tamsulosin (FLOMAX) 0.4 MG CAPS capsule Take 0.4 mg by mouth daily.   [name] torsemide (DEMADEX) 20 MG tablet TAKE THREE TABLETS BY MOUTH TWICE DAILY  . zolpidem (AMBIEN) 5 MG tablet Take 5 mg by mouth at bedtime as needed for sleep.   No facility-administered encounter medications on file as of 03/08/2018.     PHYSICAL EXAM:   General: NAD, obese; Cardiovascular: regular rate and rhythm Pulmonary: clear ant fields Abdomen: soft, nontender, + bowel sounds GU: no suprapubic tenderness Extremities: 1+ BLE edema, no joint deformities; walking  without oxygen  With no distress Skin: no rashes Neurological:  nonfocal  Kevin G [name], NP

## 2018-03-10 ENCOUNTER — Encounter: Payer: Self-pay | Admitting: Internal Medicine

## 2018-03-10 ENCOUNTER — Ambulatory Visit: Payer: Medicaid Other | Admitting: Internal Medicine

## 2018-03-10 ENCOUNTER — Ambulatory Visit (INDEPENDENT_AMBULATORY_CARE_PROVIDER_SITE_OTHER)
Admission: RE | Admit: 2018-03-10 | Discharge: 2018-03-10 | Disposition: A | Discharge status: Home / Self Care | Payer: Medicaid Other | Source: Ambulatory Visit | Attending: Internal Medicine | Admitting: Internal Medicine

## 2018-03-10 VITALS — BP 148/60 | HR 79 | Ht 68.0 in | Wt 231.6 lb

## 2018-03-10 DIAGNOSIS — J441 Chronic obstructive pulmonary disease with (acute) exacerbation: Secondary | ICD-10-CM | POA: Diagnosis not present

## 2018-03-10 MED ORDER — PREDNISONE 10 MG PO TABS: ORAL_TABLET | ORAL | 0 refills | Status: DC

## 2018-03-10 MED ORDER — UMECLIDINIUM BROMIDE 62.5 MCG/INH IN AEPB: 1.0000 | INHALATION_SPRAY | Freq: Every day | RESPIRATORY_TRACT | 11 refills | Status: DC

## 2018-03-10 NOTE — Progress Notes (Signed)
Subjective:     Patient ID: [name], male   DOB: 11/26/1955, 62 y.o.   MRN: [id number]  HPI    OV 04/10/2015  Chief Complaint  Patient presents with  . Follow-up    Pt states his breathing has worsened since last OV. Pt was admitted to [facility] for CP and syncope. Pt c/o increase in DOE, prod cough with dark brown mucus, chest disfomfort when active.    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 At that point in time spirometry showed a significant decline in forced vital capacity from 4 L at baseline to 2.6 L. He was using 4 L of oxygen at rest and 6 L with exertion at home. However we could not convincingly document hypoxemia with exertion on room air in the office at that time. Therefore to this and I ordered pulmonary function tests and CT scan of the chest. He did have CT scan of the chest in August 2016. There is no report on progression compared to February 2016 but I suspect the findings are similar. While he underwent pulmonary function test he had cough syncope and was admitted to the hospital. Cardiology notes reviewed and the final diagnosis was cough syncope. He he now presents for follow-up. He continues to be miserable with class IV dyspnea. He uses 4-6 liters of oxygen because he subjectively feels the need to do that. He does not monitor his pulse ox at home. He says that he continues to have cough syncope at home and has fallen several times. He has now moved to live with his 36 year old mom. He is socially isolated otherwise. He feels like he needs an extra layer of help at home. There are no other issues  Pulse oximetry on room air at rest and walking desaturation test 185 feet 3 laps on room air: RA rest after 15 min off o2, pulse o98% and lowest pulse ox with forehead probe was 90%. He did get dizzy with  coughing   OV 06/05/15 62 year old man with tobacco abuse and history of interstitial lung disease, desquamative interstitial pneumonitis and burnt out Langerhans' cell histiocytosis, hypoxemic respiratory failure; untreated OSA unable to tolerate CPAP. He probably also has some degree of superimposed COPD.  He presents today with two weeks of increased dyspnea, . He is having paroxysms of cough, can lead to pre-syncope / syncope. Last was 2 days ago. He has LE edema, probable more than his baseline. He is using duoneb bid, symbicort bid, albuterol prn - averages once a day. He complains of dry mouth. He took extra lasix after last OV here with TP, helped his edema but not his dyspnea.     OV 07/05/2015  Chief Complaint  Patient presents with  . Follow-up    Pt states his SOB has worsened since last OV. Pt also c/o increase in nonprod cough, pt states he frequently coughs when eating or drinking. Pt c/o right lateral chest pain when coughing. Pt denies f/c/s.   Admit    62yo male smoker with  ILD(DIP)  on Oxygen .  Polysubstance abuse hx.   08/09/2015 [facility] follow up  Patient returns for a post hospital follow-up. Patient was recently admitted with acute on chronic respiratory failure with underlying interstitial lung disease for  acute on chronic diastolic heart failure. He improved with diuresis .  He says he is feeling  improved. Has decreased shortness of breath. Unfortunately continues to smoke. We discussed smoking cessation. He denies any hemoptysis, chest pain, orthopnea, PND, or increased leg swelling.    OV 10/14/2015  Chief Complaint  Patient presents with  . Follow-up    Still having SOB, coughing and mild wheezing. Pt states that he feels like fluid is coming back especially on left side.    Follow-up interstitial lung disease and chronic hypoxemic respiratory failure associated with volume overload and diastolic dysfunction and coronary artery disease and  multiple medical problems including depression and anxiety and poor social situation  He presents for routine follow-up. During stable health times of November been able to document hypoxemia and him on room air at rest. Also during admissions he has been hypoxemic. Again today on room air 10 minutes his pulse ox was 97%. He insists that he gets very dyspneic when he turns his oxygen off and he needs 5 L of oxygen at all times. He does not check his pulse ox at home so we do not know his true oxygen levels when he is off oxygen. He has class III-for dyspnea. He is on oxygen. He lives with his mom was 66 years old and takes care of him. Overall he feels stable. [place] symptom assessment score documented below and it is obvious that is significant problems are fatigue, depression, anxiety and shortness of breath. He is open to undergo home palliative care program  [facility] Symptom Assessment Numerical Scale 0 is no problem -> 10 worst problem 10/14/2015    No Pain -> Worst pain 0  No Tiredness -> Worset tiredness 10  No Nausea -> Worst nausea 0  No Depression -> Worst depression 7  No Anxiety -> Worst Anxiety 8  No Drowsiness -> Worst Drowsiness 10  Best appetite-> Worst Appetitle 3  Best Feeling of well being -> Worst feeling 5  No dyspnea-> Worst dyspnea 8  Other problem (none -> severe) 10 - chest discomfort, when active  Completed by  patient         Last CT chest December 2016 and when compared to August 2016 no change in DIC pattern of ILD  Chest x-ray 10/04/2015: Reported as chronic changes or jaw baseline. I do not have these image for visualization  Lab work 07/26/2015: Shows a creatinine of 1.68 mg percent with a GFR of 43. Hemoglobin 13 g percent.  Cardiology visit with Dr. Domenic Polite 08/28/2015: There is discussion about switching his Lasix and metolazone and Demadex. Volume overload is likely multifactorial with RV dysfunction a significant component  Subjective:      Patient ID: [name], male   DOB: 08/27/1955, 62 y.o.   MRN: [id number]  HPI   OV 04/23/2017  Chief Complaint  Patient presents with  . Follow-up    Pt states that his breathing is worse than before. C/o SOB all the time, coughing, and chest pain. 6L O2 24/7.   Follow-up ILD chronic hypoxemic respiratory failure. He did have a high-resolution CT scan of the chest in September 2018. The shows mostly pulmonary edema and fluid pattern. I personally not seen in the last year and a half. He says that he continues to live with his mom. [facility] is visiting him 3 times a week and giving supportive care. He is on a lot of opioids and anxiolytics. He easily is nodding off in the exam room. He doesn't seem interested in giving me a good history. He has  no insight into his disease. [place] symptom assessment score shows significant worsening of fatigue and pain and depression and overall high symptom burden.   OV 03/10/2018  Chief Complaint  Patient presents with  . Follow-up    Pt states he is having a lot of pain with his lungs and it is very unbearable. Pt also states his SOB is a lot worse, coughs all the time, has a lot of back pain, is very unsteady on his feet, and also has occ. CP.    [name] , 62 y.o. , with dob 1956/04/14 and male ,Not Hispanic or Latino from [address] [place] [address] - presents to lung clinic for hronic hypoxemic respiratory failure that previously eyes seen was on the basis of ILD but CT scan last in 2018 showed that he did not have ILD. In fact he's had a follow-up CT scan of the chest May 2019 that does not show ILD. But he does have chronic hypoxemic respiratory failure. The basis of this is poorly understood but given his smoking history and 2016 pulmonary function test showing isolated reduction in diffusion capacity this would be all COPD/emphysema. He has missed many appointments and is very poor with history given his  opioid dependence. Today he is a little bit more awake and he tells me that he continues to be on 5 L oxygen nasal cannula. He says that he frequently dozes off and loses balance and falls.He also says for the last few months his lungs have been hurting but is no cough or congestion. He says he is on nebulizers but we do not have this in the med list. He does not know what nebulizers he is on. On exam today was significantly wheezing according to the medical assistant.     [place] Symptom Assessment Numerical Scale 0 is no problem -> 10 worst problem 10/14/2015   04/23/2017   No Pain -> Worst pain 0 7  No Tiredness -> Worset tiredness 10 9  No Nausea -> Worst nausea 0 0  No Depression -> Worst depression 7 10  No Anxiety -> Worst Anxiety 8 5  No Drowsiness -> Worst Drowsiness 10 3 (though was nodding off)  Best appetite-> Worst Appetitle 3 4  Best Feeling of well being -> Worst feeling 5 7  No dyspnea-> Worst dyspnea 8 8  Other problem (none -> severe) 10 - chest discomfort, when active 7 leg pain  Completed by  patient patient           has a past medical history of Anxiety, Arthritis, Cholecystitis, acute (12/20/2013), Cocaine abuse ([place]), COPD (chronic obstructive pulmonary disease) ([place]), Coronary atherosclerosis of native coronary artery, Depression, Essential hypertension, benign, GERD (gastroesophageal reflux disease), Headache, Hepatitis (Late 1970s), History of nephrolithiasis, History of pneumonia, History of stroke, Hyperlipidemia, Noncompliance, Renal cell carcinoma ([place]), Sleep apnea, and Type 2 diabetes mellitus ([place]) (2011).   reports that he has been smoking cigarettes. He started smoking about 45 years ago. He has a 20.50 pack-year smoking history. He has never used smokeless tobacco.  Past Surgical History:  Procedure Laterality Date  . APPENDECTOMY  1970's  . CHOLECYSTECTOMY N/A 12/22/2013   Procedure: LAPAROSCOPIC CHOLECYSTECTOMY ;  Surgeon: [name], MD;   Location: [facility];  Service: General;  Laterality: N/A;  . ENDARTERECTOMY Right 10/08/2014   Procedure: Right ENDARTERECTOMY CAROTID;  Surgeon: [name], MD;  Location: [facility];  Service: Vascular;  Laterality: Right;  . LAPAROSCOPIC LYSIS OF ADHESIONS  02/21/2014   Procedure: LAPAROSCOPIC LYSIS OF ADHESIONS EXTINSIVE;  Surgeon: [name], MD;  Location: [facility];  Service: Urology;;  . LEFT HEART CATHETERIZATION WITH CORONARY ANGIOGRAM N/A 06/02/2012   Procedure: LEFT HEART CATHETERIZATION WITH CORONARY ANGIOGRAM;  Surgeon: [name], MD;  Location: [facility] CATH LAB;  Service: Cardiovascular;  Laterality: N/A;  . LEFT HEART CATHETERIZATION WITH CORONARY ANGIOGRAM N/A 03/09/2013   Procedure: LEFT HEART CATHETERIZATION WITH CORONARY ANGIOGRAM;  Surgeon: [name], MD;  Location[facility] LAB;  Service: Cardiovascular;  Laterality: N/A;  . LEFT HEART CATHETERIZATION WITH CORONARY ANGIOGRAM N/A 12/20/2013   Procedure: LEFT HEART CATHETERIZATION WITH CORONARY ANGIOGRAM;  Surgeon: [name], MD;  Location: [facility] CATH LAB;  Service: Cardiovascular;  Laterality: N/A;  . LUNG BIOPSY Left 05/18/2014   Procedure: LUNG BIOPSY left upper lobe & left lower lobe;  Surgeon: [name], MD;  Location: [facility];  Service: Thoracic;  Laterality: Left;  . PATCH ANGIOPLASTY Right 10/08/2014   Procedure: PATCH ANGIOPLASTY Right Carotid;  Surgeon: [name], MD;  Location: [place];  Service: Vascular;  Laterality: Right;  . ROBOT ASSISTED LAPAROSCOPIC NEPHRECTOMY Right 02/21/2014   Procedure: ROBOTIC ASSISTED LAPAROSCOPIC RIGHT NEPHRECTOMY ;  Surgeon: [name], MD;  Location: [facility];  Service: Urology;  Laterality: Right;  [name] VIDEO ASSISTED THORACOSCOPY Left 05/18/2014   Procedure: LEFT VIDEO ASSISTED THORACOSCOPY;  Surgeon: [name], MD;  Location: [facility] [place];  Service: Thoracic;  Laterality: Left;  [name] VIDEO BRONCHOSCOPY  05/18/2014   Procedure: VIDEO BRONCHOSCOPY;  Surgeon: [name], MD;  Location: [place];  Service: Thoracic;;    Allergies  Allergen Reactions  . Oxycodone Itching and Nausea Only    Immunization History  Administered Date(s) Administered  . Influenza Split 04/04/2014, 03/27/2015  . Pneumococcal Polysaccharide-23 11/04/2012    Family History  Problem Relation Age of Onset  . Cancer Mother        Thyroid - living in her 22's.  [name] Hypertension Unknown   . Diabetes Unknown   . Stroke Unknown   . Lung cancer Father   . CAD Father   . Cancer Maternal Grandmother        Breast  . Cancer Maternal Grandfather        Throat and stomach  . CAD Brother      Current Outpatient Medications:  .  ALPRAZolam (XANAX) 0.5 MG tablet, Take 0.5 mg by mouth 3 (three) times daily. , Disp: , Rfl:  .  clopidogrel (PLAVIX) 75 MG tablet, Take 1 tablet (75 mg total) by mouth daily., Disp: 90 tablet, Rfl: 3 .  diltiazem (CARDIZEM CD) 240 MG 24 hr capsule, Take 240 mg by mouth daily., Disp: , Rfl:  .  diphenhydrAMINE (BENADRYL) 25 MG tablet, Take 25 mg by mouth every 6 (six) hours as needed., Disp: , Rfl:  .  FLUoxetine (PROZAC) 10 MG capsule, Take 10 mg by mouth every morning., Disp: , Rfl: 0 .  FLUoxetine (PROZAC) 40 MG capsule, Take 40 mg by mouth daily., Disp: , Rfl:  .  gabapentin (NEURONTIN) 300 MG capsule, Take 900 mg by mouth 3 (three) times daily. , Disp: , Rfl:  .  HUMULIN N 100 UNIT/ML injection, INJECT 100 UNITS TWICE DAILY FOR diabetes, Disp: , Rfl: 4 .  isosorbide mononitrate (IMDUR) 60 MG 24 hr tablet, Take 1 tablet (60 mg total) by mouth daily., Disp: 90 tablet, Rfl: 3 .  NITROSTAT 0.4 MG SL tablet, TAKE ONE TABLET UNDER  THE TONGUE EVERY 5MINUTES FOR UP TO 3 DOSES AS NEEDED FOR CHEST PAIN (IF NO RELIEF AFTER 3RD DOSE, PROCEED TO THE ED F, Disp: 25 tablet, Rfl: 3 .  oxybutynin (DITROPAN-XL) 10 MG 24 hr tablet, Take 10 mg by mouth daily., Disp: , Rfl: 11 .  oxyCODONE-acetaminophen (PERCOCET) 10-325 MG tablet, Take 1 tablet by mouth every 4  (four) hours as needed for pain. , Disp: , Rfl:  .  pantoprazole (PROTONIX) 40 MG tablet, Take 40 mg by mouth daily., Disp: , Rfl:  .  tamsulosin (FLOMAX) 0.4 MG CAPS capsule, Take 0.4 mg by mouth daily. , Disp: , Rfl:  .  torsemide (DEMADEX) 20 MG tablet, TAKE THREE TABLETS BY MOUTH TWICE DAILY, Disp: 180 tablet, Rfl: 3 .  zolpidem (AMBIEN) 5 MG tablet, Take 5 mg by mouth at bedtime as needed for sleep., Disp: , Rfl:     Review of Systems     Objective:   Physical Exam  Constitutional: He is oriented to person, place, and time. He appears well-developed and well-nourished. No distress.  HENT:  Head: Normocephalic and atraumatic.  Right Ear: External ear normal.  Left Ear: External ear normal.  Mouth/Throat: Oropharynx is clear and moist. No oropharyngeal exudate.  Eyes: Pupils are equal, round, and reactive to light. Conjunctivae and EOM are normal. Right eye exhibits no discharge. Left eye exhibits no discharge. No scleral icterus.  Neck: Normal range of motion. Neck supple. No JVD present. No tracheal deviation present. No thyromegaly present.  Cardiovascular: Normal rate, regular rhythm and intact distal pulses. Exam reveals no gallop and no friction rub.  No murmur heard. Pulmonary/Chest: Effort normal. No respiratory distress. He has wheezes. He has no rales. He exhibits no tenderness.  More awake than usual  Abdominal: Soft. Bowel sounds are normal. He exhibits no distension and no mass. There is no tenderness. There is no rebound and no guarding.  Musculoskeletal: Normal range of motion. He exhibits no edema or tenderness.  Lymphadenopathy:    He has no cervical adenopathy.  Neurological: He is alert and oriented to person, place, and time. He has normal reflexes. No cranial nerve deficit. Coordination normal.  Skin: Skin is warm and dry. No rash noted. He is not diaphoretic. No erythema. No pallor.  Psychiatric: He has a normal mood and affect. His behavior is normal. Judgment  and thought content normal.  Nursing note and vitals reviewed.  Vitals:   03/10/18 1023  BP: (!) 148/60  Pulse: 92  SpO2: 94%  Weight: 231 lb 9.6 oz (105.1 kg)  Height: 5\' 8"  (1.727 m)    Estimated body mass index is 35.21 kg/m as calculated from the following:   Height as of this encounter: 5\' 8"  (1.727 m).   Weight as of this encounter: 231 lb 9.6 oz (105.1 kg).     Assessment:     COPD with acute exacerbation ([place])      Plan:     1. COPD with acute exacerbation ([place])  - you do not seem to have pulmonary fibrosis  - given wheezing and smokiing history and reduction in diffusion capacity will give you diagnosis of emphysema/copd  plan  - Take prednisone 40 mg daily x 2 days, then 20mg  daily x 2 days, then 10mg  daily x 2 days, then 5mg  daily x 2 days and stop  - cxr 2 view 03/10/2018  - check pulse ox on room air at rest   - start incruse daily with albuterol as needed  -  continue scheduled 5L [place] o2  Followup 3 months - COPD CAT score    Dr. Brand Males, M.D., [facility].C.P Pulmonary and Critical Care Medicine Staff Physician, [place] Director - Interstitial Lung Disease  Program  Pulmonary [place] at [place], [place], [address]  Pager: [phone], If no answer or between  15:00h - 7:00h: call [phone] Telephone: [phone]

## 2018-03-10 NOTE — Patient Instructions (Addendum)
1. COPD with acute exacerbation ([place])  - you do not seem to have pulmonary fibrosis  - given wheezing and smokiing history and reduction in diffusion capacity will give you diagnosis of emphysema/copd  plan  - Take prednisone 40 mg daily x 2 days, then 20mg  daily x 2 days, then 10mg  daily x 2 days, then 5mg  daily x 2 days and stop  - cxr 2 view 03/10/2018  - check pulse ox on room air at rest   - start incruse daily with albuterol as needed  - continue scheduled 5L [place] o2  Followup 3 months - COPD CAT score

## 2018-03-15 ENCOUNTER — Telehealth: Payer: Self-pay | Admitting: Internal Medicine

## 2018-03-15 NOTE — Telephone Encounter (Signed)
Patient is aware of results. Verbalized understanding. Nothing further needed.

## 2018-03-16 ENCOUNTER — Other Ambulatory Visit: Payer: Self-pay | Admitting: Cardiology

## 2018-03-28 ENCOUNTER — Telehealth: Payer: Self-pay | Admitting: Internal Medicine

## 2018-03-28 NOTE — Telephone Encounter (Signed)
Attempted to call Patient.  Unable to get answer.  Left message for Patient to call back about current inhaler.

## 2018-03-29 NOTE — Telephone Encounter (Signed)
Spoke with pt, his insurance will not pay for the Incruse. Reviewing his formulary and it looks like Medicaid preferred formulary is Spiriva handihaler, Bevespi, Atfovent HFA and Stiolto. MR please advise if you would like to switch his inhaler.   Patient Instructions by Brand Males, MD at 03/10/2018 9:45 AM  Author: Brand Males, MD Author Type: Physician Filed: 03/10/2018 10:57 AM  Note Status: Addendum Cosign: Cosign Not Required Encounter Date: 03/10/2018  Editor: Brand Males, MD (Physician)  Prior Versions: 1. Brand Males, MD (Physician) at 03/10/2018 10:52 AM - Signed    1. COPD with acute exacerbation ([place])  - you do not seem to have pulmonary fibrosis  - given wheezing and smokiing history and reduction in diffusion capacity will give you diagnosis of emphysema/copd  plan  - Take prednisone 40 mg daily x 2 days, then 20mg  daily x 2 days, then 10mg  daily x 2 days, then 5mg  daily x 2 days and stop  - cxr 2 view 03/10/2018  - check pulse ox on room air at rest   - start incruse daily with albuterol as needed  - continue scheduled 5L [place] o2  Followup 3 months - COPD CAT score

## 2018-03-29 NOTE — Telephone Encounter (Signed)
Ok start bevespi 2 puff twice daily

## 2018-03-29 NOTE — Telephone Encounter (Signed)
ATC patient three times. Received a busy tone each time. Will attempt to call back later before sending in the RX.

## 2018-03-29 NOTE — Telephone Encounter (Signed)
Pt returned call. Call back number [phone]/MEQ

## 2018-03-30 MED ORDER — GLYCOPYRROLATE-FORMOTEROL 9-4.8 MCG/ACT IN AERO: 2.0000 | INHALATION_SPRAY | Freq: Two times a day (BID) | RESPIRATORY_TRACT | 11 refills | Status: DC

## 2018-03-30 NOTE — Telephone Encounter (Signed)
Called and spoke with pt letting him know we were changing his inhaler from the incruse to bevespi due to it being covered by insurance.  Pt expressed understanding. Sent script of bevespi to pt's pharmacy and discontinued incruse off of med list.  Noting further needed.

## 2018-05-27 ENCOUNTER — Other Ambulatory Visit: Payer: Self-pay

## 2018-05-27 ENCOUNTER — Encounter (HOSPITAL_COMMUNITY): Payer: Self-pay | Admitting: Obstetrics and Gynecology

## 2018-05-27 ENCOUNTER — Emergency Department (HOSPITAL_COMMUNITY): Payer: Medicaid Other

## 2018-05-27 ENCOUNTER — Emergency Department (HOSPITAL_COMMUNITY)
Admission: EM | Admit: 2018-05-27 | Discharge: 2018-05-27 | Discharge status: Against Medical Advice | Payer: Medicaid Other | Attending: Emergency Medicine | Admitting: Emergency Medicine

## 2018-05-27 DIAGNOSIS — I2510 Atherosclerotic heart disease of native coronary artery without angina pectoris: Secondary | ICD-10-CM | POA: Diagnosis not present

## 2018-05-27 DIAGNOSIS — J449 Chronic obstructive pulmonary disease, unspecified: Secondary | ICD-10-CM | POA: Diagnosis not present

## 2018-05-27 DIAGNOSIS — E1165 Type 2 diabetes mellitus with hyperglycemia: Secondary | ICD-10-CM | POA: Diagnosis present

## 2018-05-27 DIAGNOSIS — Z79899 Other long term (current) drug therapy: Secondary | ICD-10-CM | POA: Insufficient documentation

## 2018-05-27 DIAGNOSIS — N183 Chronic kidney disease, stage 3 (moderate): Secondary | ICD-10-CM | POA: Insufficient documentation

## 2018-05-27 DIAGNOSIS — I129 Hypertensive chronic kidney disease with stage 1 through stage 4 chronic kidney disease, or unspecified chronic kidney disease: Secondary | ICD-10-CM | POA: Insufficient documentation

## 2018-05-27 DIAGNOSIS — R202 Paresthesia of skin: Secondary | ICD-10-CM | POA: Diagnosis not present

## 2018-05-27 DIAGNOSIS — R739 Hyperglycemia, unspecified: Secondary | ICD-10-CM

## 2018-05-27 DIAGNOSIS — F17210 Nicotine dependence, cigarettes, uncomplicated: Secondary | ICD-10-CM | POA: Insufficient documentation

## 2018-05-27 DIAGNOSIS — Z794 Long term (current) use of insulin: Secondary | ICD-10-CM | POA: Insufficient documentation

## 2018-05-27 DIAGNOSIS — Z7901 Long term (current) use of anticoagulants: Secondary | ICD-10-CM | POA: Diagnosis not present

## 2018-05-27 LAB — CBC WITH DIFFERENTIAL/PLATELET
Abs Immature Granulocytes: 0.1 10*3/uL — ABNORMAL HIGH (ref 0.00–0.07)
Basophils Absolute: 0 10*3/uL (ref 0.0–0.1)
Basophils Relative: 0 %
EOS ABS: 0.2 10*3/uL (ref 0.0–0.5)
EOS PCT: 2 %
HEMATOCRIT: 37.1 % — AB (ref 39.0–52.0)
HEMOGLOBIN: 12.3 g/dL — AB (ref 13.0–17.0)
Immature Granulocytes: 1 %
LYMPHS ABS: 1.5 10*3/uL (ref 0.7–4.0)
LYMPHS PCT: 15 %
MCH: 26.9 pg (ref 26.0–34.0)
MCHC: 33.2 g/dL (ref 30.0–36.0)
MCV: 81.2 fL (ref 80.0–100.0)
MONO ABS: 0.6 10*3/uL (ref 0.1–1.0)
Monocytes Relative: 6 %
NRBC: 0 % (ref 0.0–0.2)
Neutro Abs: 7.2 10*3/uL (ref 1.7–7.7)
Neutrophils Relative %: 76 %
Platelets: 222 10*3/uL (ref 150–400)
RBC: 4.57 MIL/uL (ref 4.22–5.81)
RDW: 12.9 % (ref 11.5–15.5)
WBC: 9.6 10*3/uL (ref 4.0–10.5)

## 2018-05-27 LAB — COMPREHENSIVE METABOLIC PANEL
ALK PHOS: 104 U/L (ref 38–126)
ALT: 12 U/L (ref 0–44)
AST: 19 U/L (ref 15–41)
Albumin: 3.2 g/dL — ABNORMAL LOW (ref 3.5–5.0)
Anion gap: 15 (ref 5–15)
BUN: 17 mg/dL (ref 8–23)
CALCIUM: 8 mg/dL — AB (ref 8.9–10.3)
CO2: 25 mmol/L (ref 22–32)
CREATININE: 1.79 mg/dL — AB (ref 0.61–1.24)
Chloride: 92 mmol/L — ABNORMAL LOW (ref 98–111)
GFR, EST AFRICAN AMERICAN: 45 mL/min — AB (ref >60)
GFR, EST NON AFRICAN AMERICAN: 39 mL/min — AB (ref >60)
Glucose, Bld: 379 mg/dL — ABNORMAL HIGH (ref 70–99)
Potassium: 2.6 mmol/L — CL (ref 3.5–5.1)
SODIUM: 132 mmol/L — AB (ref 135–145)
Total Bilirubin: 0.5 mg/dL (ref 0.3–1.2)
Total Protein: 6.8 g/dL (ref 6.5–8.1)

## 2018-05-27 LAB — CBG MONITORING, ED: Glucose-Capillary: 390 mg/dL — ABNORMAL HIGH (ref 70–99)

## 2018-05-27 LAB — TROPONIN I: Troponin I: <0.03 ng/mL (ref <0.03)

## 2018-05-27 MED ORDER — SODIUM CHLORIDE 0.9 % IV BOLUS
500.0000 mL | Freq: Once | INTRAVENOUS | Status: AC
  Administered 2018-05-27: 500 mL via INTRAVENOUS

## 2018-05-27 MED ORDER — SODIUM CHLORIDE 0.9 % IV SOLN: INTRAVENOUS | Status: DC

## 2018-05-27 MED ORDER — SODIUM CHLORIDE 0.9 % IV BOLUS: 500.0000 mL | Freq: Once | INTRAVENOUS | Status: DC

## 2018-05-27 NOTE — ED Triage Notes (Signed)
Per Pt: Pt reports his sugars have been running up to 800 for over a week and he is having tingling in his feet and hands.  Pt reports he is also having trouble urinating and has constant dry mouth.  Pt denies N/V/D. Pt reports decreased appetite.

## 2018-05-27 NOTE — ED Notes (Signed)
Patient made aware of need for urine sample. Patient unable to provide at this time.  

## 2018-05-27 NOTE — ED Provider Notes (Signed)
[facility] DEPT Provider Note   CSN: [id number] Arrival date & time: 05/27/18  1121     History   Chief Complaint Chief Complaint  Patient presents with  . Hyperglycemia  . Tingling    HPI [name] is a 62 y.o. male.  HPI  Pt was seen at 1150. Per pt and his wife, c/o gradual onset and persistence of constant "high blood sugars" for the past 2 weeks. Pt states his CBG's have been "in the 600's to 800's." Pt states he has chronic "tingling" of his hands and feel that he feels "is worse." Denies N/V/D, no fevers, no CP/palpitations, no SOB/cough, no dysuria, no abd pain, no back pain, no focal motor weakness, no rash.   Past Medical History:  Diagnosis Date  . Anxiety   . Arthritis   . Cholecystitis, acute 12/20/2013   Lap chole 6/5  . Cocaine abuse ([place])   . COPD (chronic obstructive pulmonary disease) ([place])   . Coronary atherosclerosis of native coronary artery    a. 03/09/2013 Cath/PCI: LM nl, LAD: 50p, 56m (2.5x16 promus DES), LCX nl, OM1 min irregs, LPL/LPDA diff dzs, RCA nondom, mod diff dzs, EF 55%.  . Depression   . Essential hypertension, benign   . GERD (gastroesophageal reflux disease)   . Headache   . Hepatitis Late 1970s  . History of nephrolithiasis   . History of pneumonia   . History of stroke    Right MCA distribution, residual left-sided weakness  . Hyperlipidemia   . Noncompliance   . Renal cell carcinoma [facility])    Status post radical right nephrectomy August 2015  . Sleep apnea    On CPAP, 4L O2 no cpap at home yet  . Type 2 diabetes mellitus ([place]) 2011    Patient Active Problem List   Diagnosis Date Noted  . Acute respiratory failure ([place])   . Decreased mobility and endurance 07/19/2015  . CKD (chronic kidney disease) stage 3, GFR 30-59 ml/min ([name]) 07/06/2015  . Acute on chronic respiratory failure ([place]) 07/05/2015  . COPD exacerbation ([place]) 07/05/2015  . Interstitial lung disease ([place]) 04/10/2015  . Cough  syncope 04/10/2015  . Dyspnea 04/10/2015  . Atypical chest pain 02/28/2015  . Syncope 02/28/2015  . Orthostatic dizziness 02/25/2015  . Right leg pain 02/25/2015  . Right calf pain 02/14/2015  . Malaise and fatigue 02/14/2015  . 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[place]) 11/12/2014  . Smoking 11/12/2014  . History of stroke 10/24/2014  . Carotid disease, bilateral ([place]) 10/24/2014  . Orthostatic hypotension 10/24/2014  . Obstructive sleep apnea   . HLD (hyperlipidemia)   . Acute on chronic renal failure ([place])   . Renal cell carcinoma ([place])   . Metabolic encephalopathy   . Depression   . Anxiety state   . Noncompliance with medication regimen   . Carotid artery stenosis 10/08/2014  . Left sided numbness 09/26/2014  . Hyperreflexia 09/26/2014  . COLD (chronic obstructive lung disease) ([place]) 08/16/2014  . ILD (interstitial lung disease) ([place]) 08/16/2014  . Dizziness and giddiness 08/16/2014  . Other specified hypotension 08/16/2014  . Pulmonary edema   . Other emphysema ([place])   . Snoring 04/30/2014  . Chronic respiratory failure with hypoxia ([place]) 03/21/2014  . Fluid overload 02/22/2014  . Renal neoplasm 02/21/2014  . Preoperative cardiovascular examination 02/07/2014  . Renal mass, right 12/21/2013  . Coronary atherosclerosis of native coronary artery   . Substance abuse ([place])   . Hyperlipidemia   . Diabetes  mellitus type 2, uncontrolled, with cardiovascular complications 33/29/5188  . Tobacco abuse 06/01/2012  . H/O medication noncompliance 06/01/2012  . Cocaine abuse ([place]) 06/01/2012  . Essential hypertension, benign     Past Surgical History:  Procedure Laterality Date  . APPENDECTOMY  1970's  . CHOLECYSTECTOMY N/A 12/22/2013   Procedure: LAPAROSCOPIC CHOLECYSTECTOMY ;  Surgeon: [name], MD;  Location: [facility];  Service: General;  Laterality: N/A;  . ENDARTERECTOMY Right 10/08/2014   Procedure: Right ENDARTERECTOMY CAROTID;  Surgeon: [name], MD;   Location: [facility];  Service: Vascular;  Laterality: Right;  . LAPAROSCOPIC LYSIS OF ADHESIONS  02/21/2014   Procedure: LAPAROSCOPIC LYSIS OF ADHESIONS EXTINSIVE;  Surgeon: [name], MD;  Location: [facility];  Service: Urology;;  . LEFT HEART CATHETERIZATION WITH CORONARY ANGIOGRAM N/A 06/02/2012   Procedure: LEFT HEART CATHETERIZATION WITH CORONARY ANGIOGRAM;  Surgeon: [name], MD;  Location: [facility] CATH LAB;  Service: Cardiovascular;  Laterality: N/A;  . LEFT HEART CATHETERIZATION WITH CORONARY ANGIOGRAM N/A 03/09/2013   Procedure: LEFT HEART CATHETERIZATION WITH CORONARY ANGIOGRAM;  Surgeon: [name], MD;  Location: [facility];  Service: Cardiovascular;  Laterality: N/A;  . LEFT HEART CATHETERIZATION WITH CORONARY ANGIOGRAM N/A 12/20/2013   Procedure: LEFT HEART CATHETERIZATION WITH CORONARY ANGIOGRAM;  Surgeon: [name], MD;  Location: [facility] CATH LAB;  Service: Cardiovascular;  Laterality: N/A;  . LUNG BIOPSY Left 05/18/2014   Procedure: LUNG BIOPSY left upper lobe & left lower lobe;  Surgeon: [name], MD;  Location: [facility];  Service: Thoracic;  Laterality: Left;  . PATCH ANGIOPLASTY Right 10/08/2014   Procedure: PATCH ANGIOPLASTY Right Carotid;  Surgeon: [name], MD;  Location: [facility];  Service: Vascular;  Laterality: Right;  . ROBOT ASSISTED LAPAROSCOPIC NEPHRECTOMY Right 02/21/2014   Procedure: ROBOTIC ASSISTED LAPAROSCOPIC RIGHT NEPHRECTOMY ;  Surgeon: [name], MD;  Location: [facility];  Service: Urology;  Laterality: Right;  [name] VIDEO ASSISTED THORACOSCOPY Left 05/18/2014   Procedure: LEFT VIDEO ASSISTED THORACOSCOPY;  Surgeon: [name], MD;  Location: [facility];  Service: Thoracic;  Laterality: Left;  [name] VIDEO BRONCHOSCOPY  05/18/2014   Procedure: VIDEO BRONCHOSCOPY;  Surgeon: [name], MD;  Location: [facility];  Service: Thoracic;;        Home Medications    Prior to Admission medications   Medication Sig Start Date End Date  Taking? Authorizing Provider  ALPRAZolam Duanne Moron) 0.5 MG tablet Take 0.5 mg by mouth 3 (three) times daily.    Yes [provider]  atorvastatin (LIPITOR) 20 MG tablet Take 20 mg by mouth at bedtime. 04/26/18  Yes [provider]  cephALEXin (KEFLEX) 500 MG capsule Take 500 mg by mouth every 12 (twelve) hours. 05/19/18  Yes [provider]  clopidogrel (PLAVIX) 75 MG tablet Take 1 tablet (75 mg total) by mouth daily. 02/01/18  Yes Satira Sark, MD  diltiazem (CARDIZEM CD) 240 MG 24 hr capsule Take 240 mg by mouth daily.   Yes [provider]  diphenhydrAMINE (BENADRYL) 25 MG tablet Take 25 mg by mouth every 6 (six) hours as needed for itching.    Yes [provider]  FLUoxetine (PROZAC) 10 MG capsule Take 10 mg by mouth every morning. Take with 40 mg capsule = 50 mg total 03/01/18  Yes [provider]  FLUoxetine (PROZAC) 40 MG capsule Take 40 mg by mouth daily. Take with 10 mg capsule = 50 mg total   Yes [provider]  gabapentin (NEURONTIN) 300 MG capsule Take 900 mg by mouth 3 (three) times daily.    Yes [provider]  Glycopyrrolate-Formoterol (BEVESPI AEROSPHERE) 9-4.8 MCG/ACT AERO Inhale 2 puffs into the lungs 2 (two) times daily. 03/30/18  Yes Brand Males, MD  isosorbide mononitrate (IMDUR) 60 MG 24 hr tablet Take 1 tablet (60 mg total) by mouth daily. 02/01/18  Yes Satira Sark, MD  LANTUS SOLOSTAR 100 UNIT/ML Solostar Pen Inject 110 Units into the skin 2 (two) times daily. 05/25/18  Yes [provider]  nitroGLYCERIN (NITROSTAT) 0.4 MG SL tablet PLACE ONE TABLET UNDER THE TONGUE EVERY FIVE MINUTES AS NEEDED FOR CHEST PAIN. IF 3 TABLETS ARE NECESSARY CALL 911 Patient taking differently: Place 0.4 mg under the tongue every 5 (five) minutes as needed for chest pain. PLACE ONE TABLET UNDER THE TONGUE EVERY FIVE MINUTES AS NEEDED FOR CHEST PAIN. IF 3 TABLETS ARE NECESSARY CALL 911 03/16/18  Yes Satira Sark, MD  oxybutynin (DITROPAN-XL) 10 MG 24 hr tablet Take 10 mg by mouth daily. 02/22/18  Yes [provider]  oxyCODONE-acetaminophen (PERCOCET) 10-325 MG tablet Take 1 tablet by mouth every 4 (four) hours as needed for pain.    Yes [provider]  pantoprazole (PROTONIX) 40 MG tablet Take 40 mg by mouth daily.   Yes [provider]  Probiotic Product (PROBIOTIC PO) Take 1 capsule by mouth daily.   Yes [provider]  tamsulosin (FLOMAX) 0.4 MG CAPS capsule Take 0.4 mg by mouth daily.    Yes [provider]  torsemide (DEMADEX) 20 MG tablet TAKE THREE TABLETS BY MOUTH TWICE DAILY Patient taking differently: Take 60 mg by mouth 2 (two) times daily.  01/27/18  Yes Satira Sark, MD  zolpidem (AMBIEN) 5 MG tablet Take 5 mg by mouth at bedtime as needed for sleep.   Yes [provider]  predniSONE (DELTASONE) 10 MG tablet 40mg x2days,20mg x2days,10mg x2days,5mg x2days,thenstop. Patient not taking: Reported on 05/27/2018 03/10/18   Brand Males, MD    Family History Family History  Problem Relation Age of Onset  . Cancer Mother        Thyroid - living in her 51's.  [name] Hypertension Unknown   . Diabetes Unknown   . Stroke Unknown   . Lung cancer Father   . CAD Father   . Cancer Maternal Grandmother        Breast  . Cancer Maternal Grandfather        Throat and stomach  . CAD Brother     Social History Social History   Tobacco Use  . Smoking status: Current Some Day Smoker    Packs/day: 0.50    Years: 41.00    Pack years: 20.50    Types: Cigarettes    Start date: 05/03/1972  . Smokeless tobacco: Never Used  . Tobacco comment: 04/12/15 says smokes 2 per day, 1 in the AM and 1 in the PM  Substance Use Topics  . Alcohol use: No    Alcohol/week: 0.0 standard drinks  . Drug use: No    Types: Cocaine    Comment: last used cocaine 11/2013. but is cocaine + this admit July 2015     Allergies   Oxycodone   Review of  Systems Review of Systems ROS: Statement: All systems negative except as marked or noted in the HPI; Constitutional: Negative for fever and chills. +"high blood sugars." ; ; Eyes: Negative for eye pain, redness and discharge. ; ; ENMT: Negative for ear  pain, hoarseness, nasal congestion, sinus pressure and sore throat. ; ; Cardiovascular: Negative for chest pain, palpitations, diaphoresis, dyspnea and peripheral edema. ; ; Respiratory: Negative for cough, wheezing and stridor. ; ; Gastrointestinal: Negative for nausea, vomiting, diarrhea, abdominal pain, blood in stool, hematemesis, jaundice and rectal bleeding. . ; ; Genitourinary: Negative for dysuria, flank pain and hematuria. ; ; Musculoskeletal: Negative for back pain and neck pain. Negative for swelling and trauma.; ; Skin: Negative for pruritus, rash, abrasions, blisters, bruising and skin lesion.; ; Neuro: +paresthesias. Negative for headache, lightheadedness and neck stiffness. Negative for weakness, altered level of consciousness, altered mental status, extremity weakness, involuntary movement, seizure and syncope.       Physical Exam Updated Vital Signs BP (!) 107/57   Pulse 78   Temp 98.6 F (37 C) (Oral)   Resp 19   SpO2 94%   Physical Exam 1155: Physical examination:  Nursing notes reviewed; Vital signs and O2 SAT reviewed;  Constitutional: Well developed, Well nourished, In no acute distress; Head:  Normocephalic, atraumatic; Eyes: EOMI, PERRL, No scleral icterus; ENMT: Mouth and pharynx normal, Mucous membranes dry; Neck: Supple, Full range of motion, No lymphadenopathy; Cardiovascular: Regular rate and rhythm, No gallop; Respiratory: Breath sounds clear & equal bilaterally, No wheezes.  Speaking full sentences with ease, Normal respiratory effort/excursion; Chest: Nontender, Movement normal; Abdomen: Soft, Nontender, Nondistended, Normal bowel sounds; Genitourinary: No CVA tenderness; Extremities: Peripheral pulses normal, No  tenderness, No edema, No calf edema or asymmetry.; Neuro: AA&Ox3, Major CN grossly intact.  Speech clear. No gross focal motor or sensory deficits in extremities.; Skin: Color normal, Warm, Dry.   ED Treatments / Results  Labs (all labs ordered are listed, but only abnormal results are displayed)   EKG EKG Interpretation  Date/Time:  Friday May 27 2018 12:13:19 EST Ventricular Rate:  79 PR Interval:    QRS Duration: 96 QT Interval:  382 QTC Calculation: 438 R Axis:   108 Text Interpretation:  Sinus rhythm Low voltage with right axis deviation When compared with ECG of 07/05/2015 No significant change was found Confirmed by Francine Graven [phone]) on 05/27/2018 1:05:31 PM   Radiology   Procedures Procedures (including critical care time)  Medications Ordered in ED Medications  sodium chloride 0.9 % bolus 500 mL (0 mLs Intravenous Stopped 05/27/18 1340)     Initial Impression / Assessment and Plan / ED Course  I have reviewed the triage vital signs and the nursing notes.  Pertinent labs & imaging results that were available during my care of the patient were reviewed by me and considered in my medical decision making (see chart for details).  MDM Reviewed: previous chart, nursing note and vitals Reviewed previous: labs and ECG Interpretation: labs, ECG, x-ray and CT scan     1340:  Pt and family now want to leave before evaluation is completed. ED RN and I encouraged pt to stay, continues to refuse.  Pt makes his own medical decisions.  Risks of AMA explained to pt and family, including, but not limited to:  stroke, heart attack, cardiac arrythmia ("irregular heart rate/beat"), "passing out," temporary and/or permanent disability, death.  Pt and family verb understanding and continue to refuse admission, understanding the consequences of their decision.  I encouraged pt to follow up with his PMD on Monday and return to the ED immediately if symptoms return, or for any  other concerns.  Pt and family verb understanding, agreeable.    Final Clinical Impressions(s) / ED Diagnoses  Final diagnoses:  None    ED Discharge Orders    None       Francine Graven, DO 05/29/18 1353

## 2018-05-27 NOTE — ED Notes (Signed)
Bed: WA02 Expected date:  Expected time:  Means of arrival:  Comments: held

## 2018-05-27 NOTE — ED Notes (Addendum)
Patient leaving AMA. Patient stating they need to leave to take mom home. Patient made aware of need to receive medical care. Patient refused to stay. Patient remained stable will under our care. MD made aware of patients intent to leave.

## 2018-05-27 NOTE — Care Management Note (Signed)
Case Management Note  CM noted pt was active with [facility] palliative care.  CM contacted Inez Catalina to advise pt was in the ED.  She will follow for needs.  Kaye Luoma, Benjaman Lobe, RN 05/27/2018, 11:55 AM

## 2018-05-30 ENCOUNTER — Emergency Department (HOSPITAL_COMMUNITY): Payer: Medicaid Other

## 2018-05-30 ENCOUNTER — Encounter (HOSPITAL_COMMUNITY): Payer: Self-pay

## 2018-05-30 ENCOUNTER — Other Ambulatory Visit: Payer: Self-pay

## 2018-05-30 ENCOUNTER — Inpatient Hospital Stay (HOSPITAL_COMMUNITY)
Admission: EM | Admit: 2018-05-30 | Discharge: 2018-06-03 | DRG: 193 | Disposition: A | Discharge status: Hospice | Payer: Medicaid Other | Attending: Internal Medicine | Admitting: Internal Medicine

## 2018-05-30 DIAGNOSIS — Z9049 Acquired absence of other specified parts of digestive tract: Secondary | ICD-10-CM

## 2018-05-30 DIAGNOSIS — J441 Chronic obstructive pulmonary disease with (acute) exacerbation: Secondary | ICD-10-CM

## 2018-05-30 DIAGNOSIS — E118 Type 2 diabetes mellitus with unspecified complications: Secondary | ICD-10-CM

## 2018-05-30 DIAGNOSIS — K219 Gastro-esophageal reflux disease without esophagitis: Secondary | ICD-10-CM | POA: Diagnosis present

## 2018-05-30 DIAGNOSIS — F17210 Nicotine dependence, cigarettes, uncomplicated: Secondary | ICD-10-CM | POA: Diagnosis present

## 2018-05-30 DIAGNOSIS — Z808 Family history of malignant neoplasm of other organs or systems: Secondary | ICD-10-CM

## 2018-05-30 DIAGNOSIS — C966 Unifocal Langerhans-cell histiocytosis: Secondary | ICD-10-CM | POA: Diagnosis present

## 2018-05-30 DIAGNOSIS — G4733 Obstructive sleep apnea (adult) (pediatric): Secondary | ICD-10-CM | POA: Diagnosis present

## 2018-05-30 DIAGNOSIS — IMO0002 Reserved for concepts with insufficient information to code with codable children: Secondary | ICD-10-CM | POA: Diagnosis present

## 2018-05-30 DIAGNOSIS — I130 Hypertensive heart and chronic kidney disease with heart failure and stage 1 through stage 4 chronic kidney disease, or unspecified chronic kidney disease: Secondary | ICD-10-CM | POA: Diagnosis present

## 2018-05-30 DIAGNOSIS — E1122 Type 2 diabetes mellitus with diabetic chronic kidney disease: Secondary | ICD-10-CM | POA: Diagnosis present

## 2018-05-30 DIAGNOSIS — N183 Chronic kidney disease, stage 3 unspecified: Secondary | ICD-10-CM | POA: Diagnosis present

## 2018-05-30 DIAGNOSIS — Z79899 Other long term (current) drug therapy: Secondary | ICD-10-CM

## 2018-05-30 DIAGNOSIS — J189 Pneumonia, unspecified organism: Principal | ICD-10-CM | POA: Diagnosis present

## 2018-05-30 DIAGNOSIS — Z7902 Long term (current) use of antithrombotics/antiplatelets: Secondary | ICD-10-CM

## 2018-05-30 DIAGNOSIS — G9341 Metabolic encephalopathy: Secondary | ICD-10-CM | POA: Diagnosis not present

## 2018-05-30 DIAGNOSIS — J439 Emphysema, unspecified: Secondary | ICD-10-CM | POA: Diagnosis present

## 2018-05-30 DIAGNOSIS — Z515 Encounter for palliative care: Secondary | ICD-10-CM | POA: Diagnosis present

## 2018-05-30 DIAGNOSIS — M1990 Unspecified osteoarthritis, unspecified site: Secondary | ICD-10-CM | POA: Diagnosis present

## 2018-05-30 DIAGNOSIS — I5032 Chronic diastolic (congestive) heart failure: Secondary | ICD-10-CM | POA: Diagnosis present

## 2018-05-30 DIAGNOSIS — Z66 Do not resuscitate: Secondary | ICD-10-CM | POA: Diagnosis present

## 2018-05-30 DIAGNOSIS — Z8249 Family history of ischemic heart disease and other diseases of the circulatory system: Secondary | ICD-10-CM

## 2018-05-30 DIAGNOSIS — Z905 Acquired absence of kidney: Secondary | ICD-10-CM

## 2018-05-30 DIAGNOSIS — Z8701 Personal history of pneumonia (recurrent): Secondary | ICD-10-CM

## 2018-05-30 DIAGNOSIS — E785 Hyperlipidemia, unspecified: Secondary | ICD-10-CM | POA: Diagnosis present

## 2018-05-30 DIAGNOSIS — J440 Chronic obstructive pulmonary disease with acute lower respiratory infection: Secondary | ICD-10-CM | POA: Diagnosis present

## 2018-05-30 DIAGNOSIS — I959 Hypotension, unspecified: Secondary | ICD-10-CM

## 2018-05-30 DIAGNOSIS — I69354 Hemiplegia and hemiparesis following cerebral infarction affecting left non-dominant side: Secondary | ICD-10-CM

## 2018-05-30 DIAGNOSIS — I2510 Atherosclerotic heart disease of native coronary artery without angina pectoris: Secondary | ICD-10-CM | POA: Diagnosis present

## 2018-05-30 DIAGNOSIS — Z833 Family history of diabetes mellitus: Secondary | ICD-10-CM

## 2018-05-30 DIAGNOSIS — Z85528 Personal history of other malignant neoplasm of kidney: Secondary | ICD-10-CM

## 2018-05-30 DIAGNOSIS — E872 Acidosis: Secondary | ICD-10-CM | POA: Diagnosis present

## 2018-05-30 DIAGNOSIS — N400 Enlarged prostate without lower urinary tract symptoms: Secondary | ICD-10-CM | POA: Diagnosis present

## 2018-05-30 DIAGNOSIS — J8489 Other specified interstitial pulmonary diseases: Secondary | ICD-10-CM | POA: Diagnosis present

## 2018-05-30 DIAGNOSIS — Z794 Long term (current) use of insulin: Secondary | ICD-10-CM

## 2018-05-30 DIAGNOSIS — E1165 Type 2 diabetes mellitus with hyperglycemia: Secondary | ICD-10-CM | POA: Diagnosis present

## 2018-05-30 DIAGNOSIS — I951 Orthostatic hypotension: Secondary | ICD-10-CM | POA: Diagnosis present

## 2018-05-30 DIAGNOSIS — Z885 Allergy status to narcotic agent status: Secondary | ICD-10-CM

## 2018-05-30 DIAGNOSIS — R739 Hyperglycemia, unspecified: Secondary | ICD-10-CM

## 2018-05-30 DIAGNOSIS — Z9981 Dependence on supplemental oxygen: Secondary | ICD-10-CM

## 2018-05-30 DIAGNOSIS — Z800 Family history of malignant neoplasm of digestive organs: Secondary | ICD-10-CM

## 2018-05-30 DIAGNOSIS — J9621 Acute and chronic respiratory failure with hypoxia: Secondary | ICD-10-CM | POA: Diagnosis present

## 2018-05-30 DIAGNOSIS — E0865 Diabetes mellitus due to underlying condition with hyperglycemia: Secondary | ICD-10-CM

## 2018-05-30 DIAGNOSIS — J849 Interstitial pulmonary disease, unspecified: Secondary | ICD-10-CM | POA: Diagnosis present

## 2018-05-30 DIAGNOSIS — Z955 Presence of coronary angioplasty implant and graft: Secondary | ICD-10-CM

## 2018-05-30 DIAGNOSIS — E876 Hypokalemia: Secondary | ICD-10-CM

## 2018-05-30 DIAGNOSIS — Z803 Family history of malignant neoplasm of breast: Secondary | ICD-10-CM

## 2018-05-30 DIAGNOSIS — J449 Chronic obstructive pulmonary disease, unspecified: Secondary | ICD-10-CM | POA: Diagnosis present

## 2018-05-30 DIAGNOSIS — E860 Dehydration: Secondary | ICD-10-CM | POA: Diagnosis present

## 2018-05-30 DIAGNOSIS — Z801 Family history of malignant neoplasm of trachea, bronchus and lung: Secondary | ICD-10-CM

## 2018-05-30 DIAGNOSIS — F1490 Cocaine use, unspecified, uncomplicated: Secondary | ICD-10-CM | POA: Diagnosis present

## 2018-05-30 DIAGNOSIS — Z720 Tobacco use: Secondary | ICD-10-CM | POA: Diagnosis present

## 2018-05-30 DIAGNOSIS — Z87442 Personal history of urinary calculi: Secondary | ICD-10-CM

## 2018-05-30 DIAGNOSIS — E1142 Type 2 diabetes mellitus with diabetic polyneuropathy: Secondary | ICD-10-CM | POA: Diagnosis present

## 2018-05-30 DIAGNOSIS — E782 Mixed hyperlipidemia: Secondary | ICD-10-CM | POA: Diagnosis present

## 2018-05-30 LAB — CBC WITH DIFFERENTIAL/PLATELET
Abs Immature Granulocytes: 0.17 10*3/uL — ABNORMAL HIGH (ref 0.00–0.07)
Basophils Absolute: 0.1 10*3/uL (ref 0.0–0.1)
Basophils Relative: 1 %
EOS PCT: 1 %
Eosinophils Absolute: 0.2 10*3/uL (ref 0.0–0.5)
HEMATOCRIT: 38.3 % — AB (ref 39.0–52.0)
HEMOGLOBIN: 12.6 g/dL — AB (ref 13.0–17.0)
Immature Granulocytes: 1 %
Lymphocytes Relative: 17 %
Lymphs Abs: 2.3 10*3/uL (ref 0.7–4.0)
MCH: 27.5 pg (ref 26.0–34.0)
MCHC: 32.9 g/dL (ref 30.0–36.0)
MCV: 83.4 fL (ref 80.0–100.0)
MONO ABS: 0.7 10*3/uL (ref 0.1–1.0)
MONOS PCT: 5 %
Neutro Abs: 9.8 10*3/uL — ABNORMAL HIGH (ref 1.7–7.7)
Neutrophils Relative %: 75 %
Platelets: 232 10*3/uL (ref 150–400)
RBC: 4.59 MIL/uL (ref 4.22–5.81)
RDW: 13.1 % (ref 11.5–15.5)
WBC: 13.1 10*3/uL — AB (ref 4.0–10.5)
nRBC: 0 % (ref 0.0–0.2)

## 2018-05-30 LAB — CBG MONITORING, ED: Glucose-Capillary: 420 mg/dL — ABNORMAL HIGH (ref 70–99)

## 2018-05-30 MED ORDER — SODIUM CHLORIDE 0.9 % IV BOLUS
1000.0000 mL | Freq: Once | INTRAVENOUS | Status: AC
  Administered 2018-05-30: 1000 mL via INTRAVENOUS

## 2018-05-30 MED ORDER — IPRATROPIUM-ALBUTEROL 0.5-2.5 (3) MG/3ML IN SOLN
3.0000 mL | RESPIRATORY_TRACT | Status: DC
  Administered 2018-05-30 – 2018-05-31 (×2): 3 mL via RESPIRATORY_TRACT
  Filled 2018-05-30 (×2): qty 3

## 2018-05-30 MED ORDER — INSULIN REGULAR(HUMAN) IN NACL 100-0.9 UT/100ML-% IV SOLN
INTRAVENOUS | Status: DC
  Administered 2018-05-31: 3.6 [IU]/h via INTRAVENOUS
  Filled 2018-05-30: qty 100

## 2018-05-30 NOTE — ED Triage Notes (Signed)
Per ems: pt coming from home c/o shortness of breath, hyperglycemia and low bp. SpO2 was 79% on normal 6 L, CBG was 561, and BP was 81/72.   400 cc of NS given before reporting CHF. CBG went down 442 and BP went to 96/72 and SpO2 went to 91 on nonbreather. Dx with a kidney infection and finished antibiotics and was seen Friday and left AMA.

## 2018-05-30 NOTE — ED Notes (Signed)
ED Provider at bedside. 

## 2018-05-30 NOTE — ED Provider Notes (Signed)
[facility] DEPT Provider Note: Georgena Spurling, MD, FACEP  CSN: [id number] MRN: [id number] ARRIVAL: 05/30/18 at 2234 ROOM: WA01/WA01   CHIEF COMPLAINT  Hyperglycemia and Shortness of Breath   HISTORY OF PRESENT ILLNESS  05/30/18 11:07 PM [name] is a 62 y.o. male multiple medical problems.  He complains of shortness of breath for the past 4 days.  He was seen 3 days ago in the ED but left AMA as he had to take care of his mother.  His dyspnea has worsened and is now severe.  He states his chest is sore due to work of breathing.  He states he has been drinking frequently due to thirst but has not had an appetite.  His sugar was noted to be 561 prior to arrival with a blood pressure of 81/72.  Sugar on arrival was 420.  He was given 400 mL of normal saline by EMS before this was discontinued after he reported a history of congestive heart failure.  His initial oxygen saturation was 79% on his second liters.  He was placed on a nonrebreather and his oxygen saturation went up to 91%.  He is currently satting 90% on a nasal cannula.  He states he feels generally weak and has numbness in his feet and right hand due to neuropathy.    Past Medical History:  Diagnosis Date  . Anxiety   . Arthritis   . Cholecystitis, acute 12/20/2013   Lap chole 6/5  . Cocaine abuse ([place])   . COPD (chronic obstructive pulmonary disease) ([place])   . Coronary atherosclerosis of native coronary artery    a. 03/09/2013 Cath/PCI: LM nl, LAD: 50p, 7m (2.5x16 promus DES), LCX nl, OM1 min irregs, LPL/LPDA diff dzs, RCA nondom, mod diff dzs, EF 55%.  . Depression   . Essential hypertension, benign   . GERD (gastroesophageal reflux disease)   . Headache   . Hepatitis Late 1970s  . History of nephrolithiasis   . History of pneumonia   . History of stroke    Right MCA distribution, residual left-sided weakness  . Hyperlipidemia   . Noncompliance   . Renal cell carcinoma [facility])    Status post radical right  nephrectomy August 2015  . Sleep apnea    On CPAP, 4L O2 no cpap at home yet  . Type 2 diabetes mellitus ([place]) 2011    Past Surgical History:  Procedure Laterality Date  . APPENDECTOMY  1970's  . CHOLECYSTECTOMY N/A 12/22/2013   Procedure: LAPAROSCOPIC CHOLECYSTECTOMY ;  Surgeon: [name], MD;  Location: [place];  Service: General;  Laterality: N/A;  . ENDARTERECTOMY Right 10/08/2014   Procedure: Right ENDARTERECTOMY CAROTID;  Surgeon: [name], MD;  Location: [place];  Service: Vascular;  Laterality: Right;  . LAPAROSCOPIC LYSIS OF ADHESIONS  02/21/2014   Procedure: LAPAROSCOPIC LYSIS OF ADHESIONS EXTINSIVE;  Surgeon: [name], MD;  Location: [facility];  Service: Urology;;  . LEFT HEART CATHETERIZATION WITH CORONARY ANGIOGRAM N/A 06/02/2012   Procedure: LEFT HEART CATHETERIZATION WITH CORONARY ANGIOGRAM;  Surgeon: [name], MD;  Location: [facility] CATH LAB;  Service: Cardiovascular;  Laterality: N/A;  . LEFT HEART CATHETERIZATION WITH CORONARY ANGIOGRAM N/A 03/09/2013   Procedure: LEFT HEART CATHETERIZATION WITH CORONARY ANGIOGRAM;  Surgeon: [name], MD;  Location: [facility] CATH LAB;  Service: Cardiovascular;  Laterality: N/A;  . LEFT HEART CATHETERIZATION WITH CORONARY ANGIOGRAM N/A 12/20/2013   Procedure: LEFT HEART CATHETERIZATION WITH CORONARY ANGIOGRAM;  Surgeon: [name], MD;  Location: [place] CATH LAB;  Service: Cardiovascular;  Laterality: N/A;  . LUNG BIOPSY Left 05/18/2014   Procedure: LUNG BIOPSY left upper lobe & left lower lobe;  Surgeon: [name], MD;  Location: [place];  Service: Thoracic;  Laterality: Left;  . PATCH ANGIOPLASTY Right 10/08/2014   Procedure: PATCH ANGIOPLASTY Right Carotid;  Surgeon: [name], MD;  Location: [place];  Service: Vascular;  Laterality: Right;  . ROBOT ASSISTED LAPAROSCOPIC NEPHRECTOMY Right 02/21/2014   Procedure: ROBOTIC ASSISTED LAPAROSCOPIC RIGHT NEPHRECTOMY ;  Surgeon: [name], MD;  Location: [facility];   Service: Urology;  Laterality: Right;  [name] VIDEO ASSISTED THORACOSCOPY Left 05/18/2014   Procedure: LEFT VIDEO ASSISTED THORACOSCOPY;  Surgeon: [name], MD;  Location: [place];  Service: Thoracic;  Laterality: Left;  [name] VIDEO BRONCHOSCOPY  05/18/2014   Procedure: VIDEO BRONCHOSCOPY;  Surgeon: [name], MD;  Location: [place];  Service: Thoracic;;    Family History  Problem Relation Age of Onset  . Cancer Mother        Thyroid - living in her 93's.  [name] Hypertension Unknown   . Diabetes Unknown   . Stroke Unknown   . Lung cancer Father   . CAD Father   . Cancer Maternal Grandmother        Breast  . Cancer Maternal Grandfather        Throat and stomach  . CAD Brother     Social History   Tobacco Use  . Smoking status: Current Some Day Smoker    Packs/day: 0.50    Years: 41.00    Pack years: 20.50    Types: Cigarettes    Start date: 05/03/1972  . Smokeless tobacco: Never Used  . Tobacco comment: 04/12/15 says smokes 2 per day, 1 in the AM and 1 in the PM  Substance Use Topics  . Alcohol use: No    Alcohol/week: 0.0 standard drinks  . Drug use: No    Types: Cocaine    Comment: last used cocaine 11/2013. but is cocaine + this admit July 2015    Prior to Admission medications   Medication Sig Start Date End Date Taking? Authorizing Provider  ALPRAZolam Duanne Moron) 0.5 MG tablet Take 0.5 mg by mouth 3 (three) times daily.    Yes [provider]  atorvastatin (LIPITOR) 20 MG tablet Take 20 mg by mouth at bedtime. 04/26/18  Yes [provider]  clopidogrel (PLAVIX) 75 MG tablet Take 1 tablet (75 mg total) by mouth daily. 02/01/18  Yes Satira Sark, MD  diltiazem (CARDIZEM CD) 240 MG 24 hr capsule Take 240 mg by mouth daily.   Yes [provider]  diphenhydrAMINE (BENADRYL) 25 MG tablet Take 25 mg by mouth every 6 (six) hours as needed for itching.    Yes [provider]  FLUoxetine (PROZAC) 10 MG capsule Take 10 mg by mouth  every morning. Take with 40 mg capsule = 50 mg total 03/01/18  Yes [provider]  FLUoxetine (PROZAC) 40 MG capsule Take 40 mg by mouth daily. Take with 10 mg capsule = 50 mg total   Yes [provider]  gabapentin (NEURONTIN) 300 MG capsule Take 900 mg by mouth 3 (three) times daily.    Yes [provider]  Glycopyrrolate-Formoterol (BEVESPI AEROSPHERE) 9-4.8 MCG/ACT AERO Inhale 2 puffs into the lungs 2 (two) times daily. 03/30/18  Yes Brand Males, MD  isosorbide mononitrate (IMDUR) 60 MG 24 hr tablet Take 1 tablet (60  mg total) by mouth daily. 02/01/18  Yes Satira Sark, MD  LANTUS SOLOSTAR 100 UNIT/ML Solostar Pen Inject 110 Units into the skin 2 (two) times daily. 05/25/18  Yes [provider]  nitroGLYCERIN (NITROSTAT) 0.4 MG SL tablet PLACE ONE TABLET UNDER THE TONGUE EVERY FIVE MINUTES AS NEEDED FOR CHEST PAIN. IF 3 TABLETS ARE NECESSARY CALL 911 Patient taking differently: Place 0.4 mg under the tongue every 5 (five) minutes as needed for chest pain. PLACE ONE TABLET UNDER THE TONGUE EVERY FIVE MINUTES AS NEEDED FOR CHEST PAIN. IF 3 TABLETS ARE NECESSARY CALL 911 03/16/18  Yes Satira Sark, MD  oxybutynin (DITROPAN-XL) 10 MG 24 hr tablet Take 10 mg by mouth daily. 02/22/18  Yes [provider]  oxyCODONE-acetaminophen (PERCOCET) 10-325 MG tablet Take 1 tablet by mouth every 4 (four) hours as needed for pain.    Yes [provider]  pantoprazole (PROTONIX) 40 MG tablet Take 40 mg by mouth daily.   Yes [provider]  Probiotic Product (PROBIOTIC PO) Take 1 capsule by mouth daily.   Yes [provider]  tamsulosin (FLOMAX) 0.4 MG CAPS capsule Take 0.4 mg by mouth daily.    Yes [provider]  torsemide (DEMADEX) 20 MG tablet TAKE THREE TABLETS BY MOUTH TWICE DAILY Patient taking differently: Take 60 mg by mouth 2 (two) times daily.  01/27/18  Yes Satira Sark, MD  zolpidem (AMBIEN) 5 MG tablet  Take 5 mg by mouth at bedtime as needed for sleep.   Yes [provider]    Allergies Oxycodone   REVIEW OF SYSTEMS  Negative except as noted here or in the History of Present Illness.   PHYSICAL EXAMINATION  Initial Vital Signs Blood pressure (!) 88/68, pulse 81, temperature 98.4 F (36.9 C), temperature source Oral, resp. rate (!) 24, SpO2 90 %.  Examination General: Well-developed, well-nourished male in no acute distress; appearance consistent with age of record HENT: normocephalic; atraumatic; dry mucous membranes Eyes: pupils equal, round and reactive to light; extraocular muscles intact Neck: supple Heart: regular rate and rhythm Lungs: Inspiratory and expiratory wheezes Abdomen: soft; nondistended; nontender; bowel sounds present Extremities: No deformity; full range of motion; pulses decreased Neurologic: Awake, alert; motor function intact in all extremities and symmetric; no facial droop Skin: Warm and dry Psychiatric: Flat affect   RESULTS  Summary of this visit's results, reviewed by myself:   EKG Interpretation  Date/Time:  Monday May 30 2018 22:47:55 EST Ventricular Rate:  86 PR Interval:    QRS Duration: 104 QT Interval:  356 QTC Calculation: 426 R Axis:   175 Text Interpretation:  Sinus rhythm Atrial premature complex Right axis deviation Borderline T abnormalities, inferior leads No significant change was found Confirmed by Shanon Rosser [phone]) on 05/30/2018 11:06:44 PM      Laboratory Studies: Results for orders placed or performed during the hospital encounter of 05/30/18 (from the past 24 hour(s))  CBG monitoring, ED     Status: Abnormal   Collection Time: 05/30/18 10:49 PM  Result Value Ref Range   Glucose-Capillary 420 (H) 70 - 99 mg/dL  CBC with Differential/Platelet     Status: Abnormal   Collection Time: 05/30/18 11:47 PM  Result Value Ref Range   WBC 13.1 (H) 4.0 - 10.5 K/uL   RBC 4.59 4.22 - 5.81 MIL/uL   Hemoglobin  12.6 (L) 13.0 - 17.0 g/dL   HCT 38.3 (L) 39.0 - 52.0 %   MCV 83.4 80.0 - 100.0  fL   MCH 27.5 26.0 - 34.0 pg   MCHC 32.9 30.0 - 36.0 g/dL   RDW 13.1 11.5 - 15.5 %   Platelets 232 150 - 400 K/uL   nRBC 0.0 0.0 - 0.2 %   Neutrophils Relative % 75 %   Neutro Abs 9.8 (H) 1.7 - 7.7 K/uL   Lymphocytes Relative 17 %   Lymphs Abs 2.3 0.7 - 4.0 K/uL   Monocytes Relative 5 %   Monocytes Absolute 0.7 0.1 - 1.0 K/uL   Eosinophils Relative 1 %   Eosinophils Absolute 0.2 0.0 - 0.5 K/uL   Basophils Relative 1 %   Basophils Absolute 0.1 0.0 - 0.1 K/uL   Immature Granulocytes 1 %   Abs Immature Granulocytes 0.17 (H) 0.00 - 0.07 K/uL  Basic metabolic panel     Status: Abnormal   Collection Time: 05/30/18 11:47 PM  Result Value Ref Range   Sodium 130 (L) 135 - 145 mmol/L   Potassium 2.3 (LL) 3.5 - 5.1 mmol/L   Chloride 89 (L) 98 - 111 mmol/L   CO2 27 22 - 32 mmol/L   Glucose, Bld 395 (H) 70 - 99 mg/dL   BUN 14 8 - 23 mg/dL   Creatinine, Ser 1.85 (H) 0.61 - 1.24 mg/dL   Calcium 7.8 (L) 8.9 - 10.3 mg/dL   GFR calc non Af Amer 37 (L) >60 mL/min   GFR calc Af Amer 43 (L) >60 mL/min   Anion gap 14 5 - 15  Brain natriuretic peptide     Status: None   Collection Time: 05/30/18 11:47 PM  Result Value Ref Range   B Natriuretic Peptide 71.2 0.0 - 100.0 pg/mL  Troponin I - ONCE - STAT     Status: None   Collection Time: 05/30/18 11:47 PM  Result Value Ref Range   Troponin I <0.03 <0.03 ng/mL  Blood gas, venous     Status: Abnormal   Collection Time: 05/30/18 11:47 PM  Result Value Ref Range   pH, Ven 7.394 7.250 - 7.430   pCO2, Ven 48.2 44.0 - 60.0 mmHg   pO2, Ven 46.6 (H) 32.0 - 45.0 mmHg   Bicarbonate 28.9 (H) 20.0 - 28.0 mmol/L   Acid-Base Excess 3.7 (H) 0.0 - 2.0 mmol/L   O2 Saturation 80.1 %   Patient temperature 98.6    Collection site VEIN    Drawn by DRAWN BY RN    Sample type VEIN   CBG monitoring, ED     Status: Abnormal   Collection Time: 05/31/18  1:14 AM  Result Value Ref Range    Glucose-Capillary 306 (H) 70 - 99 mg/dL   Comment 1 Notify RN   I-Stat CG4 Lactic Acid, ED     Status: Abnormal   Collection Time: 05/31/18  1:37 AM  Result Value Ref Range   Lactic Acid, Venous 2.91 (HH) 0.5 - 1.9 mmol/L   Comment NOTIFIED PHYSICIAN    Imaging Studies: Dg Chest 2 View  Result Date: 05/30/2018 CLINICAL DATA:  Shortness of breath. EXAM: CHEST - 2 VIEW COMPARISON:  Multiple chest x-rays since February 14, 2018 FINDINGS: Effusion and opacity in the left base is stable. No pneumothorax. The cardiomediastinal silhouette is stable. No other acute abnormalities identified. IMPRESSION: Stable opacity and effusion on the left. Electronically Signed   By: Dorise Bullion III M.D   On: 05/30/2018 23:40    ED COURSE and MDM  Nursing notes and initial vitals signs, including pulse oximetry, reviewed.  Vitals:   05/30/18 2349 05/31/18 0030 05/31/18 0100 05/31/18 0132  BP: 90/74 93/75 (!) 88/60 95/67  Pulse: 85 87 91 82  Resp: (!) 27 (!) 22 15 15   Temp: 98.6 F (37 C)     TempSrc: Oral     SpO2: 91% 94% 91% 91%  Weight:      Height:       1:11 AM IV fluid bolus initiated and patient placed on insulin drip per glucose stabilizer.  Oral and IV potassium repletion initiated.  Patient given neb treatment for COPD exacerbation.  Will have patient admitted to the hospitalist service.  Patient still hypotensive, likely due to dehydration from hyperglycemia.  Second liter of normal saline ordered.  BNP and chest x-ray are not suggestive of acute CHF.  PROCEDURES   CRITICAL CARE Performed by: Karen Chafe Kierstynn Babich Total critical care time: 35 minutes Critical care time was exclusive of separately billable procedures and treating other patients. Critical care was necessary to treat or prevent imminent or life-threatening deterioration. Critical care was time spent personally by me on the following activities: development of treatment plan with patient and/or surrogate as well as nursing,  discussions with consultants, evaluation of patient's response to treatment, examination of patient, obtaining history from patient or surrogate, ordering and performing treatments and interventions, ordering and review of laboratory studies, ordering and review of radiographic studies, pulse oximetry and re-evaluation of patient's condition.   ED DIAGNOSES     ICD-10-CM   1. Hyperglycemia R73.9   2. Hypokalemia E87.6   3. COPD exacerbation ([place]) J44.1   4. Acute hypotension I95.9        Kaiyu Mirabal, Jenny Reichmann, MD 05/31/18 [phone]

## 2018-05-30 NOTE — ED Notes (Signed)
Bed: WA01 Expected date:  Expected time:  Means of arrival:  Comments: [facility] [place]

## 2018-05-31 ENCOUNTER — Telehealth: Payer: Self-pay

## 2018-05-31 ENCOUNTER — Encounter (HOSPITAL_COMMUNITY): Payer: Self-pay | Admitting: Emergency Medicine

## 2018-05-31 DIAGNOSIS — E1142 Type 2 diabetes mellitus with diabetic polyneuropathy: Secondary | ICD-10-CM | POA: Diagnosis present

## 2018-05-31 DIAGNOSIS — J9601 Acute respiratory failure with hypoxia: Secondary | ICD-10-CM | POA: Diagnosis not present

## 2018-05-31 DIAGNOSIS — I2510 Atherosclerotic heart disease of native coronary artery without angina pectoris: Secondary | ICD-10-CM | POA: Diagnosis present

## 2018-05-31 DIAGNOSIS — E860 Dehydration: Secondary | ICD-10-CM | POA: Diagnosis present

## 2018-05-31 DIAGNOSIS — I69354 Hemiplegia and hemiparesis following cerebral infarction affecting left non-dominant side: Secondary | ICD-10-CM | POA: Diagnosis not present

## 2018-05-31 DIAGNOSIS — E872 Acidosis: Secondary | ICD-10-CM | POA: Diagnosis present

## 2018-05-31 DIAGNOSIS — G4733 Obstructive sleep apnea (adult) (pediatric): Secondary | ICD-10-CM | POA: Diagnosis present

## 2018-05-31 DIAGNOSIS — I130 Hypertensive heart and chronic kidney disease with heart failure and stage 1 through stage 4 chronic kidney disease, or unspecified chronic kidney disease: Secondary | ICD-10-CM | POA: Diagnosis present

## 2018-05-31 DIAGNOSIS — J181 Lobar pneumonia, unspecified organism: Secondary | ICD-10-CM | POA: Diagnosis not present

## 2018-05-31 DIAGNOSIS — F1490 Cocaine use, unspecified, uncomplicated: Secondary | ICD-10-CM | POA: Diagnosis present

## 2018-05-31 DIAGNOSIS — Z515 Encounter for palliative care: Secondary | ICD-10-CM | POA: Diagnosis present

## 2018-05-31 DIAGNOSIS — J189 Pneumonia, unspecified organism: Secondary | ICD-10-CM | POA: Diagnosis present

## 2018-05-31 DIAGNOSIS — E0865 Diabetes mellitus due to underlying condition with hyperglycemia: Secondary | ICD-10-CM | POA: Diagnosis not present

## 2018-05-31 DIAGNOSIS — J9621 Acute and chronic respiratory failure with hypoxia: Secondary | ICD-10-CM | POA: Diagnosis present

## 2018-05-31 DIAGNOSIS — J440 Chronic obstructive pulmonary disease with acute lower respiratory infection: Secondary | ICD-10-CM | POA: Diagnosis present

## 2018-05-31 DIAGNOSIS — J8489 Other specified interstitial pulmonary diseases: Secondary | ICD-10-CM | POA: Diagnosis present

## 2018-05-31 DIAGNOSIS — Z66 Do not resuscitate: Secondary | ICD-10-CM | POA: Diagnosis present

## 2018-05-31 DIAGNOSIS — E1122 Type 2 diabetes mellitus with diabetic chronic kidney disease: Secondary | ICD-10-CM | POA: Diagnosis present

## 2018-05-31 DIAGNOSIS — R0789 Other chest pain: Secondary | ICD-10-CM | POA: Diagnosis not present

## 2018-05-31 DIAGNOSIS — N400 Enlarged prostate without lower urinary tract symptoms: Secondary | ICD-10-CM | POA: Diagnosis present

## 2018-05-31 DIAGNOSIS — E1165 Type 2 diabetes mellitus with hyperglycemia: Secondary | ICD-10-CM | POA: Diagnosis present

## 2018-05-31 DIAGNOSIS — I951 Orthostatic hypotension: Secondary | ICD-10-CM | POA: Diagnosis present

## 2018-05-31 DIAGNOSIS — N183 Chronic kidney disease, stage 3 (moderate): Secondary | ICD-10-CM | POA: Diagnosis present

## 2018-05-31 DIAGNOSIS — R079 Chest pain, unspecified: Secondary | ICD-10-CM | POA: Diagnosis not present

## 2018-05-31 DIAGNOSIS — I5032 Chronic diastolic (congestive) heart failure: Secondary | ICD-10-CM | POA: Diagnosis present

## 2018-05-31 DIAGNOSIS — G9341 Metabolic encephalopathy: Secondary | ICD-10-CM | POA: Diagnosis not present

## 2018-05-31 DIAGNOSIS — C966 Unifocal Langerhans-cell histiocytosis: Secondary | ICD-10-CM | POA: Diagnosis present

## 2018-05-31 DIAGNOSIS — E876 Hypokalemia: Secondary | ICD-10-CM | POA: Diagnosis present

## 2018-05-31 HISTORY — DX: Pneumonia, unspecified organism: J18.9

## 2018-05-31 HISTORY — DX: Acute and chronic respiratory failure with hypoxia: J96.21

## 2018-05-31 LAB — BASIC METABOLIC PANEL
ANION GAP: 12 (ref 5–15)
ANION GAP: 14 (ref 5–15)
BUN: 13 mg/dL (ref 8–23)
BUN: 14 mg/dL (ref 8–23)
CHLORIDE: 97 mmol/L — AB (ref 98–111)
CHLORIDE: 89 mmol/L — AB (ref 98–111)
CO2: 29 mmol/L (ref 22–32)
CO2: 27 mmol/L (ref 22–32)
CREATININE: 1.85 mg/dL — AB (ref 0.61–1.24)
Calcium: 7.4 mg/dL — ABNORMAL LOW (ref 8.9–10.3)
Calcium: 7.8 mg/dL — ABNORMAL LOW (ref 8.9–10.3)
Creatinine, Ser: 1.6 mg/dL — ABNORMAL HIGH (ref 0.61–1.24)
GFR calc Af Amer: 52 mL/min — ABNORMAL LOW (ref >60)
GFR calc non Af Amer: 37 mL/min — ABNORMAL LOW (ref >60)
GFR, EST AFRICAN AMERICAN: 43 mL/min — AB (ref >60)
GFR, EST NON AFRICAN AMERICAN: 45 mL/min — AB (ref >60)
Glucose, Bld: 193 mg/dL — ABNORMAL HIGH (ref 70–99)
Glucose, Bld: 395 mg/dL — ABNORMAL HIGH (ref 70–99)
Potassium: 2.3 mmol/L — CL (ref 3.5–5.1)
Potassium: 2.9 mmol/L — ABNORMAL LOW (ref 3.5–5.1)
Sodium: 130 mmol/L — ABNORMAL LOW (ref 135–145)
Sodium: 138 mmol/L (ref 135–145)

## 2018-05-31 LAB — URINALYSIS, ROUTINE W REFLEX MICROSCOPIC
BILIRUBIN URINE: NEGATIVE
Bacteria, UA: NONE SEEN
Glucose, UA: >=500 mg/dL — AB
Hgb urine dipstick: NEGATIVE
KETONES UR: NEGATIVE mg/dL
Leukocytes, UA: NEGATIVE
Nitrite: NEGATIVE
Protein, ur: NEGATIVE mg/dL
Specific Gravity, Urine: 1.001 — ABNORMAL LOW (ref 1.005–1.030)
pH: 6 (ref 5.0–8.0)

## 2018-05-31 LAB — CK TOTAL AND CKMB (NOT AT ARMC)
CK TOTAL: 35 U/L — AB (ref 49–397)
CK, MB: 2 ng/mL (ref 0.5–5.0)
Relative Index: INVALID (ref 0.0–2.5)

## 2018-05-31 LAB — TROPONIN I: Troponin I: <0.03 ng/mL (ref <0.03)

## 2018-05-31 LAB — HEPATIC FUNCTION PANEL
ALBUMIN: 2.9 g/dL — AB (ref 3.5–5.0)
ALT: 11 U/L (ref 0–44)
AST: 18 U/L (ref 15–41)
Alkaline Phosphatase: 71 U/L (ref 38–126)
BILIRUBIN DIRECT: 0.1 mg/dL (ref 0.0–0.2)
Indirect Bilirubin: 0.3 mg/dL (ref 0.3–0.9)
TOTAL PROTEIN: 6.4 g/dL — AB (ref 6.5–8.1)
Total Bilirubin: 0.4 mg/dL (ref 0.3–1.2)

## 2018-05-31 LAB — BLOOD GAS, VENOUS
ACID-BASE EXCESS: 3.7 mmol/L — AB (ref 0.0–2.0)
BICARBONATE: 28.9 mmol/L — AB (ref 20.0–28.0)
O2 SAT: 80.1 %
PATIENT TEMPERATURE: 98.6
PH VEN: 7.394 (ref 7.250–7.430)
pCO2, Ven: 48.2 mmHg (ref 44.0–60.0)
pO2, Ven: 46.6 mmHg — ABNORMAL HIGH (ref 32.0–45.0)

## 2018-05-31 LAB — CBC WITH DIFFERENTIAL/PLATELET
Abs Immature Granulocytes: 0.1 10*3/uL — ABNORMAL HIGH (ref 0.00–0.07)
BASOS ABS: 0 10*3/uL (ref 0.0–0.1)
BASOS PCT: 0 %
Eosinophils Absolute: 0.2 10*3/uL (ref 0.0–0.5)
Eosinophils Relative: 2 %
HCT: 36.9 % — ABNORMAL LOW (ref 39.0–52.0)
Hemoglobin: 11.9 g/dL — ABNORMAL LOW (ref 13.0–17.0)
IMMATURE GRANULOCYTES: 1 %
LYMPHS PCT: 18 %
Lymphs Abs: 1.7 10*3/uL (ref 0.7–4.0)
MCH: 26.7 pg (ref 26.0–34.0)
MCHC: 32.2 g/dL (ref 30.0–36.0)
MCV: 82.9 fL (ref 80.0–100.0)
Monocytes Absolute: 0.6 10*3/uL (ref 0.1–1.0)
Monocytes Relative: 6 %
NEUTROS ABS: 7 10*3/uL (ref 1.7–7.7)
NEUTROS PCT: 73 %
NRBC: 0 % (ref 0.0–0.2)
PLATELETS: 212 10*3/uL (ref 150–400)
RBC: 4.45 MIL/uL (ref 4.22–5.81)
RDW: 13.2 % (ref 11.5–15.5)
WBC: 9.6 10*3/uL (ref 4.0–10.5)

## 2018-05-31 LAB — GLUCOSE, CAPILLARY
GLUCOSE-CAPILLARY: 372 mg/dL — AB (ref 70–99)
Glucose-Capillary: 342 mg/dL — ABNORMAL HIGH (ref 70–99)

## 2018-05-31 LAB — CBG MONITORING, ED
GLUCOSE-CAPILLARY: 202 mg/dL — AB (ref 70–99)
GLUCOSE-CAPILLARY: 189 mg/dL — AB (ref 70–99)
GLUCOSE-CAPILLARY: 201 mg/dL — AB (ref 70–99)
Glucose-Capillary: 306 mg/dL — ABNORMAL HIGH (ref 70–99)
Glucose-Capillary: 264 mg/dL — ABNORMAL HIGH (ref 70–99)
Glucose-Capillary: 220 mg/dL — ABNORMAL HIGH (ref 70–99)
Glucose-Capillary: 166 mg/dL — ABNORMAL HIGH (ref 70–99)
Glucose-Capillary: 270 mg/dL — ABNORMAL HIGH (ref 70–99)

## 2018-05-31 LAB — LACTIC ACID, PLASMA
LACTIC ACID, VENOUS: 4 mmol/L — AB (ref 0.5–1.9)
Lactic Acid, Venous: 2.8 mmol/L (ref 0.5–1.9)

## 2018-05-31 LAB — MRSA PCR SCREENING: MRSA by PCR: NEGATIVE

## 2018-05-31 LAB — I-STAT CG4 LACTIC ACID, ED: Lactic Acid, Venous: 2.91 mmol/L (ref 0.5–1.9)

## 2018-05-31 LAB — BRAIN NATRIURETIC PEPTIDE: B Natriuretic Peptide: 71.2 pg/mL (ref 0.0–100.0)

## 2018-05-31 LAB — MAGNESIUM: MAGNESIUM: 1.2 mg/dL — AB (ref 1.7–2.4)

## 2018-05-31 MED ORDER — OXYCODONE-ACETAMINOPHEN 10-325 MG PO TABS: 1.0000 | ORAL_TABLET | ORAL | Status: DC | PRN

## 2018-05-31 MED ORDER — ALPRAZOLAM 0.5 MG PO TABS
0.5000 mg | ORAL_TABLET | Freq: Three times a day (TID) | ORAL | Status: DC
  Administered 2018-05-31 – 2018-06-03 (×6): 0.5 mg via ORAL
  Filled 2018-05-31 (×7): qty 1

## 2018-05-31 MED ORDER — POTASSIUM CHLORIDE 10 MEQ/100ML IV SOLN
10.0000 meq | INTRAVENOUS | Status: AC
  Administered 2018-05-31 (×4): 10 meq via INTRAVENOUS
  Filled 2018-05-31 (×4): qty 100

## 2018-05-31 MED ORDER — OXYBUTYNIN CHLORIDE ER 5 MG PO TB24
10.0000 mg | ORAL_TABLET | Freq: Every day | ORAL | Status: DC
  Administered 2018-05-31 – 2018-06-03 (×4): 10 mg via ORAL
  Filled 2018-05-31: qty 1
  Filled 2018-05-31 (×3): qty 2

## 2018-05-31 MED ORDER — MAGNESIUM SULFATE 4 GM/100ML IV SOLN
4.0000 g | Freq: Once | INTRAVENOUS | Status: AC
  Administered 2018-05-31: 4 g via INTRAVENOUS
  Filled 2018-05-31: qty 100

## 2018-05-31 MED ORDER — SODIUM CHLORIDE 0.9 % IV SOLN
1.0000 g | INTRAVENOUS | Status: DC
  Administered 2018-05-31 – 2018-06-03 (×4): 1 g via INTRAVENOUS
  Filled 2018-05-31 (×2): qty 1
  Filled 2018-05-31: qty 10
  Filled 2018-05-31: qty 1

## 2018-05-31 MED ORDER — TAMSULOSIN HCL 0.4 MG PO CAPS
0.4000 mg | ORAL_CAPSULE | Freq: Every day | ORAL | Status: DC
  Administered 2018-05-31 – 2018-06-03 (×4): 0.4 mg via ORAL
  Filled 2018-05-31 (×4): qty 1

## 2018-05-31 MED ORDER — HEPARIN SODIUM (PORCINE) 5000 UNIT/ML IJ SOLN
5000.0000 [IU] | Freq: Three times a day (TID) | INTRAMUSCULAR | Status: DC
  Administered 2018-05-31 – 2018-06-03 (×8): 5000 [IU] via SUBCUTANEOUS
  Filled 2018-05-31 (×10): qty 1

## 2018-05-31 MED ORDER — SODIUM CHLORIDE 0.9 % IV SOLN
INTRAVENOUS | Status: DC
  Administered 2018-05-31 – 2018-06-01 (×2): via INTRAVENOUS

## 2018-05-31 MED ORDER — FLUOXETINE HCL 10 MG PO CAPS
10.0000 mg | ORAL_CAPSULE | Freq: Every morning | ORAL | Status: DC
  Administered 2018-05-31 – 2018-06-03 (×4): 10 mg via ORAL
  Filled 2018-05-31 (×4): qty 1

## 2018-05-31 MED ORDER — ALBUTEROL SULFATE (2.5 MG/3ML) 0.083% IN NEBU: 2.5000 mg | INHALATION_SOLUTION | RESPIRATORY_TRACT | Status: DC | PRN

## 2018-05-31 MED ORDER — ATORVASTATIN CALCIUM 20 MG PO TABS
20.0000 mg | ORAL_TABLET | Freq: Every day | ORAL | Status: DC
  Administered 2018-05-31 – 2018-06-02 (×3): 20 mg via ORAL
  Filled 2018-05-31 (×2): qty 1
  Filled 2018-05-31: qty 2

## 2018-05-31 MED ORDER — INSULIN ASPART 100 UNIT/ML ~~LOC~~ SOLN
0.0000 [IU] | Freq: Three times a day (TID) | SUBCUTANEOUS | Status: DC
  Administered 2018-05-31: 8 [IU] via SUBCUTANEOUS
  Administered 2018-05-31: 15 [IU] via SUBCUTANEOUS
  Administered 2018-06-01 (×2): 8 [IU] via SUBCUTANEOUS
  Administered 2018-06-01: 5 [IU] via SUBCUTANEOUS
  Administered 2018-06-02: 3 [IU] via SUBCUTANEOUS
  Administered 2018-06-02: 2 [IU] via SUBCUTANEOUS
  Administered 2018-06-02: 11 [IU] via SUBCUTANEOUS
  Administered 2018-06-03: 3 [IU] via SUBCUTANEOUS
  Filled 2018-05-31: qty 1

## 2018-05-31 MED ORDER — GABAPENTIN 300 MG PO CAPS
900.0000 mg | ORAL_CAPSULE | Freq: Three times a day (TID) | ORAL | Status: DC
  Administered 2018-05-31 – 2018-06-03 (×6): 900 mg via ORAL
  Filled 2018-05-31 (×9): qty 3

## 2018-05-31 MED ORDER — IPRATROPIUM-ALBUTEROL 0.5-2.5 (3) MG/3ML IN SOLN
3.0000 mL | Freq: Three times a day (TID) | RESPIRATORY_TRACT | Status: DC
  Administered 2018-05-31 – 2018-06-03 (×10): 3 mL via RESPIRATORY_TRACT
  Filled 2018-05-31 (×10): qty 3

## 2018-05-31 MED ORDER — INSULIN GLARGINE 100 UNIT/ML ~~LOC~~ SOLN
50.0000 [IU] | Freq: Two times a day (BID) | SUBCUTANEOUS | Status: DC
  Administered 2018-05-31 – 2018-06-01 (×3): 50 [IU] via SUBCUTANEOUS
  Filled 2018-05-31 (×5): qty 0.5

## 2018-05-31 MED ORDER — OXYCODONE HCL 5 MG PO TABS
5.0000 mg | ORAL_TABLET | ORAL | Status: DC | PRN
  Administered 2018-05-31 – 2018-06-01 (×2): 5 mg via ORAL
  Filled 2018-05-31 (×3): qty 1

## 2018-05-31 MED ORDER — SODIUM CHLORIDE 0.9 % IV BOLUS
1000.0000 mL | Freq: Once | INTRAVENOUS | Status: AC
  Administered 2018-05-31: 1000 mL via INTRAVENOUS

## 2018-05-31 MED ORDER — NITROGLYCERIN 0.4 MG SL SUBL
0.4000 mg | SUBLINGUAL_TABLET | SUBLINGUAL | Status: DC | PRN
  Administered 2018-05-31 – 2018-06-01 (×6): 0.4 mg via SUBLINGUAL
  Filled 2018-05-31 (×4): qty 1

## 2018-05-31 MED ORDER — OXYCODONE-ACETAMINOPHEN 5-325 MG PO TABS
1.0000 | ORAL_TABLET | ORAL | Status: DC | PRN
  Administered 2018-05-31 – 2018-06-02 (×4): 1 via ORAL
  Filled 2018-05-31 (×4): qty 1

## 2018-05-31 MED ORDER — NICOTINE 14 MG/24HR TD PT24
14.0000 mg | MEDICATED_PATCH | Freq: Every day | TRANSDERMAL | Status: DC
  Administered 2018-05-31 – 2018-06-02 (×3): 14 mg via TRANSDERMAL
  Filled 2018-05-31 (×4): qty 1

## 2018-05-31 MED ORDER — PANTOPRAZOLE SODIUM 40 MG PO TBEC
40.0000 mg | DELAYED_RELEASE_TABLET | Freq: Every day | ORAL | Status: DC
  Administered 2018-05-31 – 2018-06-03 (×4): 40 mg via ORAL
  Filled 2018-05-31 (×4): qty 1

## 2018-05-31 MED ORDER — AZITHROMYCIN 250 MG PO TABS
500.0000 mg | ORAL_TABLET | Freq: Every day | ORAL | Status: DC
  Administered 2018-05-31 – 2018-06-03 (×4): 500 mg via ORAL
  Filled 2018-05-31 (×4): qty 2

## 2018-05-31 MED ORDER — CLOPIDOGREL BISULFATE 75 MG PO TABS
75.0000 mg | ORAL_TABLET | Freq: Every day | ORAL | Status: DC
  Administered 2018-05-31 – 2018-06-03 (×4): 75 mg via ORAL
  Filled 2018-05-31 (×4): qty 1

## 2018-05-31 MED ORDER — GUAIFENESIN-DM 100-10 MG/5ML PO SYRP
5.0000 mL | ORAL_SOLUTION | Freq: Four times a day (QID) | ORAL | Status: DC
  Administered 2018-05-31 – 2018-06-03 (×7): 5 mL via ORAL
  Filled 2018-05-31 (×10): qty 10

## 2018-05-31 MED ORDER — FLUOXETINE HCL 20 MG PO CAPS
40.0000 mg | ORAL_CAPSULE | Freq: Every day | ORAL | Status: DC
  Administered 2018-05-31 – 2018-06-03 (×4): 40 mg via ORAL
  Filled 2018-05-31 (×4): qty 2

## 2018-05-31 MED ORDER — POTASSIUM CHLORIDE CRYS ER 20 MEQ PO TBCR
40.0000 meq | EXTENDED_RELEASE_TABLET | Freq: Once | ORAL | Status: AC
  Administered 2018-05-31: 40 meq via ORAL
  Filled 2018-05-31: qty 2

## 2018-05-31 MED ORDER — INSULIN ASPART 100 UNIT/ML ~~LOC~~ SOLN
0.0000 [IU] | Freq: Every day | SUBCUTANEOUS | Status: DC
  Administered 2018-05-31: 4 [IU] via SUBCUTANEOUS
  Administered 2018-06-02: 3 [IU] via SUBCUTANEOUS

## 2018-05-31 NOTE — ED Notes (Signed)
Respiratory at bedside. Patient spO2 88% patient O2 increased from 5 litters to 6 litters (Home o2)

## 2018-05-31 NOTE — ED Notes (Signed)
Chest pain relieved after 2nd nitro.

## 2018-05-31 NOTE — ED Notes (Signed)
Admitting provider at bedside.

## 2018-05-31 NOTE — ED Notes (Signed)
Patient given meal tray.

## 2018-05-31 NOTE — ED Notes (Signed)
Pt's brother Lanny Hurst- [phone] would like to be called at anytime with updates

## 2018-05-31 NOTE — H&P (Addendum)
History and Physical    [name] BMW:[id number] DOB: 1956-02-25 DOA: 05/30/2018  PCP: Glenford Bayley, DO   Patient coming from: Home    Chief Complaint: Shortness of breath, generalized weakness  HPI: [name] is a 62 y.o. male with medical history significant of History of renal cell carcinoma, Status post radical right nephrectomy on 2015, interstitial lung disease, chronic respiratory failure on 5 to 7 L of oxygen at home, diastolic CHF, coronary disease, stroke, hyperlipidemia other medical problems who presented from home to the emergency department last night with complaints of worsening shortness of breath since last 4 days.  Dyspnea  is a chronic problem but he has been more short of breath since last few days to the point that he cannot move from one room to another with getting short of breath.  Patient was increasingly weak and has poor appetite. Patient lives with his elderly mother.  He presented to the emergency department with similar complaints about 3 days ago but left AMA because he had to take care of his mother.  Patient also was very thirsty and frequently urinating. When patient presented to the emergency department he was noted to be hyperglycemic and hypotensive.  He was saturating in 7 9% on 2 L of oxygen.  Had to be put on nonrebreather and his oxygenation went up to 91%. Patient seen and examined the bedside in the emergency department.  He is a pleasant gentleman.  His blood pressure was noted to be on the lower side but looks like this is his chronic issue.  He was alert and oriented.  He denies any chest pain, palpitations, abdominal pain, dysuria, nausea, vomiting, fever, chills, melena, hematochezia or headache.  ED Course: Noted to be hyperglycemic in the emergency department on presentation so he was started on insulin drip which later was stopped.   Past Medical History:  Diagnosis Date  . Anxiety   . Arthritis   . Cholecystitis, acute 12/20/2013   Lap chole 6/5  . Cocaine abuse ([place])   . COPD (chronic obstructive pulmonary disease) ([place])   . Coronary atherosclerosis of native coronary artery    a. 03/09/2013 Cath/PCI: LM nl, LAD: 50p, 87m (2.5x16 promus DES), LCX nl, OM1 min irregs, LPL/LPDA diff dzs, RCA nondom, mod diff dzs, EF 55%.  . Depression   . Essential hypertension, benign   . GERD (gastroesophageal reflux disease)   . Headache   . Hepatitis Late 1970s  . History of nephrolithiasis   . History of pneumonia   . History of stroke    Right MCA distribution, residual left-sided weakness  . Hyperlipidemia   . Noncompliance   . Renal cell carcinoma [facility])    Status post radical right nephrectomy August 2015  . Sleep apnea    On CPAP, 4L O2 no cpap at home yet  . Type 2 diabetes mellitus ([place]) 2011    Past Surgical History:  Procedure Laterality Date  . APPENDECTOMY  1970's  . CHOLECYSTECTOMY N/A 12/22/2013   Procedure: LAPAROSCOPIC CHOLECYSTECTOMY ;  Surgeon: [name], MD;  Location: [place];  Service: General;  Laterality: N/A;  . ENDARTERECTOMY Right 10/08/2014   Procedure: Right ENDARTERECTOMY CAROTID;  Surgeon: [name], MD;  Location: [place];  Service: Vascular;  Laterality: Right;  . LAPAROSCOPIC LYSIS OF ADHESIONS  02/21/2014   Procedure: LAPAROSCOPIC LYSIS OF ADHESIONS EXTINSIVE;  Surgeon: [name], MD;  Location: [facility];  Service: Urology;;  . LEFT HEART CATHETERIZATION WITH  CORONARY ANGIOGRAM N/A 06/02/2012   Procedure: LEFT HEART CATHETERIZATION WITH CORONARY ANGIOGRAM;  Surgeon: [name], MD;  Location: [facility] CATH LAB;  Service: Cardiovascular;  Laterality: N/A;  . LEFT HEART CATHETERIZATION WITH CORONARY ANGIOGRAM N/A 03/09/2013   Procedure: LEFT HEART CATHETERIZATION WITH CORONARY ANGIOGRAM;  Surgeon: [name], MD;  Location: [facility] CATH LAB;  Service: Cardiovascular;  Laterality: N/A;  . LEFT HEART CATHETERIZATION WITH CORONARY ANGIOGRAM N/A 12/20/2013   Procedure: LEFT HEART  CATHETERIZATION WITH CORONARY ANGIOGRAM;  Surgeon: [name], MD;  Location: [facility] CATH LAB;  Service: Cardiovascular;  Laterality: N/A;  . LUNG BIOPSY Left 05/18/2014   Procedure: LUNG BIOPSY left upper lobe & left lower lobe;  Surgeon: [name], MD;  Location: [place];  Service: Thoracic;  Laterality: Left;  . PATCH ANGIOPLASTY Right 10/08/2014   Procedure: PATCH ANGIOPLASTY Right Carotid;  Surgeon: [name], MD;  Location: [place];  Service: Vascular;  Laterality: Right;  . ROBOT ASSISTED LAPAROSCOPIC NEPHRECTOMY Right 02/21/2014   Procedure: ROBOTIC ASSISTED LAPAROSCOPIC RIGHT NEPHRECTOMY ;  Surgeon: [name], MD;  Location: [facility];  Service: Urology;  Laterality: Right;  [name] VIDEO ASSISTED THORACOSCOPY Left 05/18/2014   Procedure: LEFT VIDEO ASSISTED THORACOSCOPY;  Surgeon: [name], MD;  Location: [place];  Service: Thoracic;  Laterality: Left;  [name] VIDEO BRONCHOSCOPY  05/18/2014   Procedure: VIDEO BRONCHOSCOPY;  Surgeon: [name], MD;  Location: [place];  Service: Thoracic;;     reports that he has been smoking cigarettes. He started smoking about 46 years ago. He has a 20.50 pack-year smoking history. He has never used smokeless tobacco. He reports that he does not drink alcohol or use drugs.  Allergies  Allergen Reactions  . Oxycodone Itching and Nausea Only    Pt is able to tolerate if taken with benadryl    Family History  Problem Relation Age of Onset  . Cancer Mother        Thyroid - living in her 28's.  [name] Hypertension Unknown   . Diabetes Unknown   . Stroke Unknown   . Lung cancer Father   . CAD Father   . Cancer Maternal Grandmother        Breast  . Cancer Maternal Grandfather        Throat and stomach  . CAD Brother      Prior to Admission medications   Medication Sig Start Date End Date Taking? Authorizing Provider  ALPRAZolam Duanne Moron) 0.5 MG tablet Take 0.5 mg by mouth 3 (three) times daily.    Yes [provider]  atorvastatin (LIPITOR) 20 MG tablet Take 20 mg by mouth at bedtime. 04/26/18  Yes [provider]  clopidogrel (PLAVIX) 75 MG tablet Take 1 tablet (75 mg total) by mouth daily. 02/01/18  Yes Satira Sark, MD  diltiazem (CARDIZEM CD) 240 MG 24 hr capsule Take 240 mg by mouth daily.   Yes [provider]  diphenhydrAMINE (BENADRYL) 25 MG tablet Take 25 mg by mouth every 6 (six) hours as needed for itching.    Yes [provider]  FLUoxetine (PROZAC) 10 MG capsule Take 10 mg by mouth every morning. Take with 40 mg capsule = 50 mg total 03/01/18  Yes [provider]  FLUoxetine (PROZAC) 40 MG capsule Take 40 mg by mouth daily. Take with 10 mg capsule = 50 mg total   Yes [provider]  gabapentin (NEURONTIN) 300 MG capsule Take 900 mg by mouth  3 (three) times daily.    Yes [provider]  Glycopyrrolate-Formoterol (BEVESPI AEROSPHERE) 9-4.8 MCG/ACT AERO Inhale 2 puffs into the lungs 2 (two) times daily. 03/30/18  Yes Brand Males, MD  isosorbide mononitrate (IMDUR) 60 MG 24 hr tablet Take 1 tablet (60 mg total) by mouth daily. 02/01/18  Yes Satira Sark, MD  LANTUS SOLOSTAR 100 UNIT/ML Solostar Pen Inject 110 Units into the skin 2 (two) times daily. 05/25/18  Yes [provider]  nitroGLYCERIN (NITROSTAT) 0.4 MG SL tablet PLACE ONE TABLET UNDER THE TONGUE EVERY FIVE MINUTES AS NEEDED FOR CHEST PAIN. IF 3 TABLETS ARE NECESSARY CALL 911 Patient taking differently: Place 0.4 mg under the tongue every 5 (five) minutes as needed for chest pain. PLACE ONE TABLET UNDER THE TONGUE EVERY FIVE MINUTES AS NEEDED FOR CHEST PAIN. IF 3 TABLETS ARE NECESSARY CALL 911 03/16/18  Yes Satira Sark, MD  oxybutynin (DITROPAN-XL) 10 MG 24 hr tablet Take 10 mg by mouth daily. 02/22/18  Yes [provider]  oxyCODONE-acetaminophen (PERCOCET) 10-325 MG tablet Take 1 tablet by mouth every 4 (four) hours as needed for pain.    Yes [provider]  pantoprazole (PROTONIX) 40 MG tablet Take 40 mg by mouth daily.   Yes [provider]  Probiotic Product (PROBIOTIC PO) Take 1 capsule by mouth daily.   Yes [provider]  tamsulosin (FLOMAX) 0.4 MG CAPS capsule Take 0.4 mg by mouth daily.    Yes [provider]  torsemide (DEMADEX) 20 MG tablet TAKE THREE TABLETS BY MOUTH TWICE DAILY Patient taking differently: Take 60 mg by mouth 2 (two) times daily.  01/27/18  Yes Satira Sark, MD  zolpidem (AMBIEN) 5 MG tablet Take 5 mg by mouth at bedtime as needed for sleep.   Yes [provider]    Physical Exam: Vitals:   05/31/18 0730 05/31/18 0753 05/31/18 0800 05/31/18 0830  BP: (!) 84/67  91/68 (!) 88/73  Pulse: 73  74 81  Resp: 16  (!) 22 18  Temp:      TempSrc:      SpO2: (!) 86% 90% 96% 90%  Weight:      Height:        Constitutional: Pleasant, in mild respiratory distress, obese Vitals:   05/31/18 0730 05/31/18 0753 05/31/18 0800 05/31/18 0830  BP: (!) 84/67  91/68 (!) 88/73  Pulse: 73  74 81  Resp: 16  (!) 22 18  Temp:      TempSrc:      SpO2: (!) 86% 90% 96% 90%  Weight:      Height:       Eyes: PERRL, lids and conjunctivae normal ENMT: Mucous membranes are dry. Posterior pharynx clear of any exudate or lesions.Normal dentition.  Neck: normal, supple, no masses, no thyromegaly Respiratory: Bilateral crackles, normal respiratory effort. No accessory muscle use.  Cardiovascular: Regular rate and rhythm, no murmurs / rubs / gallops. No extremity edema. 2+ pedal pulses. No carotid bruits.  Abdomen: no tenderness, no masses palpated. No hepatosplenomegaly. Bowel sounds positive.  Mildly distended Musculoskeletal: no clubbing / cyanosis. No joint deformity upper and lower extremities. Good ROM, no contractures. Normal muscle tone.  Skin: no rashes, lesions, ulcers. No induration Neurologic: CN 2-12 grossly intact. Sensation intact, DTR normal. Strength 5/5 in all 4.    Psychiatric: Normal judgment and insight. Alert and oriented x 3. Normal mood.   Foley Catheter:None  Labs on Admission: I have personally reviewed following  labs and imaging studies  CBC: Recent Labs  Lab 05/27/18 1241 05/30/18 2347 05/31/18 0533  WBC 9.6 13.1* 9.6  NEUTROABS 7.2 9.8* 7.0  HGB 12.3* 12.6* 11.9*  HCT 37.1* 38.3* 36.9*  MCV 81.2 83.4 82.9  PLT 222 232 818   Basic Metabolic Panel: Recent Labs  Lab 05/27/18 1241 05/30/18 2347 05/31/18 0533  NA 132* 130* 138  K 2.6* 2.3* 2.9*  CL 92* 89* 97*  CO2 25 27 29   GLUCOSE 379* 395* 193*  BUN 17 14 13   CREATININE 1.79* 1.85* 1.60*  CALCIUM 8.0* 7.8* 7.4*  MG  --   --  1.2*   GFR: Estimated Creatinine Clearance: 60.3 mL/min (A) (by C-G formula based on SCr of 1.6 mg/dL (H)). Liver Function Tests: Recent Labs  Lab 05/27/18 1241 05/31/18 0533  AST 19 18  ALT 12 11  ALKPHOS 104 71  BILITOT 0.5 0.4  PROT 6.8 6.4*  ALBUMIN 3.2* 2.9*   No results for input(s): LIPASE, AMYLASE in the last 168 hours. No results for input(s): AMMONIA in the last 168 hours. Coagulation Profile: No results for input(s): INR, PROTIME in the last 168 hours. Cardiac Enzymes: Recent Labs  Lab 05/27/18 1241 05/30/18 2347 05/31/18 0533  TROPONINI <0.03 <0.03 <0.03   BNP (last 3 results) No results for input(s): PROBNP in the last 8760 hours. HbA1C: No results for input(s): HGBA1C in the last 72 hours. CBG: Recent Labs  Lab 05/31/18 0353 05/31/18 0510 05/31/18 0625 05/31/18 0639 05/31/18 0740  GLUCAP 220* 202* 189* 201* 166*   Lipid Profile: No results for input(s): CHOL, HDL, LDLCALC, TRIG, CHOLHDL, LDLDIRECT in the last 72 hours. Thyroid Function Tests: No results for input(s): TSH, T4TOTAL, FREET4, T3FREE, THYROIDAB in the last 72 hours. Anemia Panel: No results for input(s): VITAMINB12, FOLATE, FERRITIN, TIBC, IRON, RETICCTPCT in the last 72 hours. Urine analysis:    Component Value Date/Time   COLORURINE  STRAW (A) 05/31/2018 0212   APPEARANCEUR CLEAR 05/31/2018 0212   LABSPEC 1.001 (L) 05/31/2018 0212   PHURINE 6.0 05/31/2018 0212   GLUCOSEU >=500 (A) 05/31/2018 0212   HGBUR NEGATIVE 05/31/2018 0212   BILIRUBINUR NEGATIVE 05/31/2018 0212   KETONESUR NEGATIVE 05/31/2018 0212   PROTEINUR NEGATIVE 05/31/2018 0212   UROBILINOGEN 0.2 02/28/2015 1310   NITRITE NEGATIVE 05/31/2018 0212   LEUKOCYTESUR NEGATIVE 05/31/2018 0212    Radiological Exams on Admission: Dg Chest 2 View  Result Date: 05/30/2018 CLINICAL DATA:  Shortness of breath. EXAM: CHEST - 2 VIEW COMPARISON:  Multiple chest x-rays since February 14, 2018 FINDINGS: Effusion and opacity in the left base is stable. No pneumothorax. The cardiomediastinal silhouette is stable. No other acute abnormalities identified. IMPRESSION: Stable opacity and effusion on the left. Electronically Signed   By: Dorise Bullion III M.D   On: 05/30/2018 23:40     Assessment/Plan Principal Problem:   Acute respiratory failure with hypoxia ([place]) Active Problems:   Diabetes mellitus type 2, uncontrolled, with cardiovascular complications   Tobacco abuse   Coronary atherosclerosis of native coronary artery   Chronic respiratory failure with hypoxia ([name])   COLD (chronic obstructive lung disease) ([name])   ILD (interstitial lung disease) ([name])   Obstructive sleep apnea   HLD (hyperlipidemia)   CKD (chronic kidney disease) stage 3, GFR 30-59 ml/min ([name])   Community acquired pneumonia   Acute on chronic respiratory failure with hypoxia ([name])  Acute respiratory failure with hypoxia : On 5 to 6 L of oxygen at home on baseline.  He has chronic dyspnea.  He follows with Dr. 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 Was also being followed by palliative care/ hospice services at home.  Hypotension/lactic acidosis: Could not rule  out sepsis.  We will send blood cultures.  Start on empiric antibiotics with ceftriaxone and azithromycin to cover for possible pneumonia.  Hypotension looks like his chronic issue but blood pressure noted to be in the range of 80/50s on presentation.  Continue IV fluids.  Please check for need of continuation of IV fluids tomorrow. will hold antihypertensives.  Suspected pneumonia: He has mild effusion and opacity in the left base which looks like a chronic finding on reviewing his previous chest x-rays.  Presented with lactic acidosis and worsening hypoxia.  Will start on antibiotics. Can DC abx as appropriate. Will send sputum cultures, blood cultures.  COPD: Thought to be on  COPD  Exacerbation on presentation. He has chronic dyspnea secondary to ILD,COPD and CHF.  He is on home oxygen at 5-7 L.   Also on oxycodone for chronic dyspnea.  CKD stage III: Currently kidney function is at baseline.  Hypokalemia/hypomagnesemia: Severe.  Will continue supplementation.  We will continue to monitor the levels.  History of CHF: History of diastolic CHF.  Not in acute CHF at present.  He was hypotensive.  Need to continue IV fluids. He was on Demadex at home.  Follows with cardiology.  His last echocardiogram on 2016 showed ejection fraction of 60 to 16%, grade 1 diastolic dysfunction.  Coronary disease: Status post PCI.  Continue Lipitor, Plavix,.Will hold imdur due to hypotension.  No complaints of chest pain.  History of a stroke: Has residual left-sided weakness.  Hyperlipidemia: Continue Lipitor  Sleep apnea: Not CPAP at home, because he could not tolerate.[name]  He is on 5-7 L oxygen at home.  Type 2 diabetes mellitus: Continue sliding-scale insulin.  Noted to be hyperglycemic on presentation and was started on insulin drip which controlled his blood sugars.  Will check hemoglobin A1c.  We will continue Lantus and sliding scale insulin  Generalized weakness: Due to current conditions.  Will  request for PT evaluation after overall stabilization.  Neuropathy: Continue Neurontin  BPH: Continue tamsulosin  Multiple comorbidities/poor prognosis:He has been followed by palliative care at home but they stopped following him because "he did not die on their timeframe".He has MOST form  for DNR. If his overall condition deteriorates, he might be a candidate for hospice and we can discuss with palliative care on this hospitalization.  Severity of Illness: The appropriate patient status for this patient is INPATIENT.     DVT prophylaxis: Heparin [place] Code Status: Patient is a DNR Family Communication: None present at the bedside Consults called: None     Shelly Coss MD [facility] Pager [phone]  If 7PM-7AM, please contact night-coverage [email or web address] Password [facility]  05/31/2018, 8:49 AM

## 2018-05-31 NOTE — ED Notes (Signed)
Per ADHIKARI, AMRIT discontinue to the glucose stabilizer. Start NS. Patient can eat.

## 2018-05-31 NOTE — ED Notes (Signed)
Pt reports chest and right arm pain. Giving PRN Nitro.

## 2018-05-31 NOTE — ED Notes (Signed)
Pt reminded that urine sample is needed. Urinal at bedside

## 2018-05-31 NOTE — Telephone Encounter (Signed)
Received phone call from Levada Dy at [facility] to provide update that patient is in ED with plans for admission

## 2018-05-31 NOTE — ED Notes (Signed)
Pt given meal tray.

## 2018-05-31 NOTE — ED Notes (Signed)
I gave critical I Stat CG4 result to MD Molpus 

## 2018-05-31 NOTE — ED Notes (Signed)
Date and time results received: 05/31/18 12:56 AM    Test:Potassium Critical Value: 2.3  Name of Provider Notified: Molpus

## 2018-05-31 NOTE — Care Management Note (Signed)
Case Management Note  CM noted pt's return and admission.  Contacted Betty with [facility] palliative team who is aware pt is being admitted.  Elvenia Godden, Benjaman Lobe, RN 05/31/2018, 11:45 AM

## 2018-06-01 ENCOUNTER — Encounter (HOSPITAL_COMMUNITY): Payer: Self-pay

## 2018-06-01 DIAGNOSIS — R079 Chest pain, unspecified: Secondary | ICD-10-CM

## 2018-06-01 DIAGNOSIS — J9621 Acute and chronic respiratory failure with hypoxia: Secondary | ICD-10-CM

## 2018-06-01 DIAGNOSIS — J181 Lobar pneumonia, unspecified organism: Secondary | ICD-10-CM

## 2018-06-01 LAB — CBC
HCT: 37.4 % — ABNORMAL LOW (ref 39.0–52.0)
HEMOGLOBIN: 11.9 g/dL — AB (ref 13.0–17.0)
MCH: 27.4 pg (ref 26.0–34.0)
MCHC: 31.8 g/dL (ref 30.0–36.0)
MCV: 86 fL (ref 80.0–100.0)
Platelets: 180 10*3/uL (ref 150–400)
RBC: 4.35 MIL/uL (ref 4.22–5.81)
RDW: 13 % (ref 11.5–15.5)
WBC: 7.9 10*3/uL (ref 4.0–10.5)
nRBC: 0 % (ref 0.0–0.2)

## 2018-06-01 LAB — BASIC METABOLIC PANEL
Anion gap: 9 (ref 5–15)
BUN: 14 mg/dL (ref 8–23)
CO2: 26 mmol/L (ref 22–32)
Calcium: 8.1 mg/dL — ABNORMAL LOW (ref 8.9–10.3)
Chloride: 102 mmol/L (ref 98–111)
Creatinine, Ser: 1.42 mg/dL — ABNORMAL HIGH (ref 0.61–1.24)
GFR calc Af Amer: 60 mL/min — ABNORMAL LOW (ref >60)
GFR calc non Af Amer: 51 mL/min — ABNORMAL LOW (ref >60)
Glucose, Bld: 250 mg/dL — ABNORMAL HIGH (ref 70–99)
Potassium: 3.7 mmol/L (ref 3.5–5.1)
Sodium: 137 mmol/L (ref 135–145)

## 2018-06-01 LAB — RAPID URINE DRUG SCREEN, HOSP PERFORMED
Amphetamines: NOT DETECTED
Barbiturates: NOT DETECTED
Benzodiazepines: NOT DETECTED
COCAINE: NOT DETECTED
OPIATES: NOT DETECTED
Tetrahydrocannabinol: NOT DETECTED

## 2018-06-01 LAB — GLUCOSE, CAPILLARY
GLUCOSE-CAPILLARY: 246 mg/dL — AB (ref 70–99)
Glucose-Capillary: 256 mg/dL — ABNORMAL HIGH (ref 70–99)
Glucose-Capillary: 289 mg/dL — ABNORMAL HIGH (ref 70–99)
Glucose-Capillary: 281 mg/dL — ABNORMAL HIGH (ref 70–99)

## 2018-06-01 LAB — TROPONIN I
Troponin I: <0.03 ng/mL (ref <0.03)
Troponin I: <0.03 ng/mL (ref <0.03)

## 2018-06-01 LAB — HEMOGLOBIN A1C
HEMOGLOBIN A1C: 12.2 % — AB (ref 4.8–5.6)
MEAN PLASMA GLUCOSE: 303.44 mg/dL

## 2018-06-01 LAB — LACTIC ACID, PLASMA
Lactic Acid, Venous: 2.1 mmol/L (ref 0.5–1.9)
Lactic Acid, Venous: 2.2 mmol/L (ref 0.5–1.9)

## 2018-06-01 LAB — MAGNESIUM: MAGNESIUM: 2.1 mg/dL (ref 1.7–2.4)

## 2018-06-01 MED ORDER — SODIUM CHLORIDE 0.9 % IV BOLUS
250.0000 mL | Freq: Once | INTRAVENOUS | Status: AC
  Administered 2018-06-01: 250 mL via INTRAVENOUS

## 2018-06-01 MED ORDER — INSULIN GLARGINE 100 UNIT/ML ~~LOC~~ SOLN
65.0000 [IU] | Freq: Two times a day (BID) | SUBCUTANEOUS | Status: DC
  Administered 2018-06-01 – 2018-06-02 (×2): 65 [IU] via SUBCUTANEOUS
  Filled 2018-06-01 (×3): qty 0.65

## 2018-06-01 MED ORDER — ISOSORBIDE MONONITRATE ER 60 MG PO TB24
60.0000 mg | ORAL_TABLET | Freq: Every day | ORAL | Status: DC
  Administered 2018-06-01 – 2018-06-03 (×3): 60 mg via ORAL
  Filled 2018-06-01 (×3): qty 1

## 2018-06-01 MED ORDER — INSULIN ASPART 100 UNIT/ML ~~LOC~~ SOLN
5.0000 [IU] | Freq: Three times a day (TID) | SUBCUTANEOUS | Status: DC
  Administered 2018-06-01 – 2018-06-02 (×4): 5 [IU] via SUBCUTANEOUS

## 2018-06-01 MED ORDER — SODIUM CHLORIDE 0.9 % IV BOLUS
500.0000 mL | Freq: Once | INTRAVENOUS | Status: AC
  Administered 2018-06-01: 500 mL via INTRAVENOUS

## 2018-06-01 MED ORDER — NALOXONE HCL 0.4 MG/ML IJ SOLN
0.4000 mg | INTRAMUSCULAR | Status: DC | PRN
  Administered 2018-06-01 – 2018-06-02 (×2): 0.4 mg via INTRAVENOUS
  Filled 2018-06-01 (×2): qty 1

## 2018-06-01 NOTE — Progress Notes (Signed)
PROGRESS NOTE                                                                                                                                                                                                             Patient Demographics:    [name], is a 62 y.o. male, DOB - 1955-09-23, OTL:[id number]  Admit date - 05/30/2018   Admitting Physician Shelly Coss, MD  Outpatient Primary MD for the patient is Glenford Bayley, DO  LOS - 1  Outpatient Specialists: cardiology  Chief Complaint  Patient presents with  . Hyperglycemia  . Shortness of Breath       Brief Narrative   62 year old obese male with history of renal cell carcinoma status post right radical nephrectomy in 2015, interstitial lung disease with chronic respiratory failure on 5-7 L home O2, diastolic CHF, CAD with history of stenting in 2014, history of stroke, hyperlipidemia, uncontrolled type 2 diabetes mellitus with A1c >12 presented to the ED with increasing shortness of breath for past 4 days.  Patient has chronic dyspnea but symptoms worsened on presentation.  Also reported increased weakness and poor appetite.  In the ED he was hypotensive with O2 sat at 79% on 2 L, hyperglycemic in 400s.  Patient placed on nonrebreather and sats improved to 91%.  Labs showed elevated lactic acid with chest x-ray showing mild effusion and?  Opacity in the left lung base.  Patient admitted to stepdown unit and placed on IV fluids along with empiric antibiotic for possible pneumonia.   Subjective:   Patient lying on the edge of the bed complaining of substernal chest pain radiating to the right arm.  He reports that he gets frequent chest heaviness and takes sublingual nitroglycerin multiple times a day.  EKG done which was unremarkable and given sublingual nitrate after which his symptoms subsided.   Assessment  & Plan :    Principal Problem:   Acute on chronic  respiratory failure with hypoxia [facility]) Patient has underlying desquamative interstitial pneumonitis (DIP) and burned-out Langerhans' cells histiocytosis on chronic O2 (5-7 L).  Follows with Dr. Chase Caller.  Reportedly also been followed by palliative care at home. Suspected pneumonia based on initial presentation and mild effusion/opacity in the left lung base.  Started on empiric antibiotics and cultures sent. Sats currently maintained on 6 L.   Active  Problems:   Diabetes mellitus type 2, uncontrolled, with cardiovascular complications, peripheral neuropathy A1c >12.  Suspect nonadherence to medication.  He was initially started on insulin drip that was discontinued and placed on twice daily Lantus along with sliding scale coverage.  Diabetic coordinator consulted. Continue Neurontin.    Coronary atherosclerosis of native coronary artery Frequent chest pain symptoms.  EKG done this morning was unremarkable, initial troponin negative.  I have resumed his Imdur.  He has ongoing tobacco use and needs counseling on cessation.  Also listed for cocaine use.  Check urine drug screen. Continue Plavix and statin.   Orthostatic hypotension/lactic acidosis. Suspected infection.  Has chronic low blood pressure.  Was 80s/50s on presentation.  Improved this morning.  I have resumed his Imdur and held other medications.  Discontinue fluids.  Severe hypokalemia/hypomagnesia Replenished  Chronic COPD Has chronic symptoms.  No signs of exacerbation.  Continue nebs.    Tobacco abuse Counseled on cessation.  History of CVA with mild residual left-sided weakness. Continue statin.  Chronic diastolic CHF Currently euvolemic.  DC fluids.  Holding other home blood pressure medications.  Follow-up with outpatient cardiology.    Obstructive sleep apnea Not yet on CPAP since he has not been able to tolerate.    HLD (hyperlipidemia) Continue statin.    CKD (chronic kidney disease) stage 3, GFR 30-59  ml/min ([name]) Stable at baseline.  BPH Continue Flomax.   Generalized weakness PT evaluation.    Code Status : DNR  Family Communication  : Friend at bedside  Disposition Plan  : Transfer to telemetry  Barriers For Discharge : Active symptoms  Consults  : None  Procedures  : None  DVT Prophylaxis  :  Lovenox -   Lab Results  Component Value Date   PLT 180 06/01/2018    Antibiotics  :    Anti-infectives (From admission, onward)   Start     Dose/Rate Route Frequency Ordered Stop   05/31/18 1000  azithromycin (ZITHROMAX) tablet 500 mg     500 mg Oral Daily 05/31/18 0857     05/31/18 0900  cefTRIAXone (ROCEPHIN) 1 g in sodium chloride 0.9 % 100 mL IVPB     1 g 200 mL/hr over 30 Minutes Intravenous Every 24 hours 05/31/18 0857          Objective:   Vitals:   06/01/18 0853 06/01/18 0900 06/01/18 1000 06/01/18 1100  BP:  114/78 92/61 99/73   Pulse:  82 75 80  Resp:  (!) 25 15 16   Temp:      TempSrc:      SpO2: 95% 95% 92% 94%  Weight:      Height:        Wt Readings from Last 3 Encounters:  05/30/18 106.1 kg  03/10/18 105.1 kg  02/13/18 107.8 kg     Intake/Output Summary (Last 24 hours) at 06/01/2018 1211 Last data filed at 06/01/2018 4196 Gross per 24 hour  Intake 1552.16 ml  Output 502 ml  Net 1050.16 ml     Physical Exam  Gen: Fatigued, in distress with pain HEENT: no pallor, moist mucosa, supple neck Chest: Few scattered rhonchi CVS: N S1&S2, no murmurs, rubs or gallop GI: soft, NT, ND, BS+ Musculoskeletal: warm, no edema     Data Review:    CBC Recent Labs  Lab 05/27/18 1241 05/30/18 2347 05/31/18 0533 06/01/18 0321  WBC 9.6 13.1* 9.6 7.9  HGB 12.3* 12.6* 11.9* 11.9*  HCT 37.1* 38.3* 36.9* 37.4*  PLT  222 232 212 180  MCV 81.2 83.4 82.9 86.0  MCH 26.9 27.5 26.7 27.4  MCHC 33.2 32.9 32.2 31.8  RDW 12.9 13.1 13.2 13.0  LYMPHSABS 1.5 2.3 1.7  --   MONOABS 0.6 0.7 0.6  --   EOSABS 0.2 0.2 0.2  --   BASOSABS 0.0 0.1 0.0   --     Chemistries  Recent Labs  Lab 05/27/18 1241 05/30/18 2347 05/31/18 0533 06/01/18 0311 06/01/18 0321  NA 132* 130* 138  --  137  K 2.6* 2.3* 2.9*  --  3.7  CL 92* 89* 97*  --  102  CO2 25 27 29   --  26  GLUCOSE 379* 395* 193*  --  250*  BUN 17 14 13   --  14  CREATININE 1.79* 1.85* 1.60*  --  1.42*  CALCIUM 8.0* 7.8* 7.4*  --  8.1*  MG  --   --  1.2* 2.1  --   AST 19  --  18  --   --   ALT 12  --  11  --   --   ALKPHOS 104  --  71  --   --   BILITOT 0.5  --  0.4  --   --    ------------------------------------------------------------------------------------------------------------------ No results for input(s): CHOL, HDL, LDLCALC, TRIG, CHOLHDL, LDLDIRECT in the last 72 hours.  Lab Results  Component Value Date   HGBA1C 12.2 (H) 06/01/2018   ------------------------------------------------------------------------------------------------------------------ No results for input(s): TSH, T4TOTAL, T3FREE, THYROIDAB in the last 72 hours.  Invalid input(s): FREET3 ------------------------------------------------------------------------------------------------------------------ No results for input(s): VITAMINB12, FOLATE, FERRITIN, TIBC, IRON, RETICCTPCT in the last 72 hours.  Coagulation profile No results for input(s): INR, PROTIME in the last 168 hours.  No results for input(s): DDIMER in the last 72 hours.  Cardiac Enzymes Recent Labs  Lab 05/31/18 0533 05/31/18 1621 06/01/18 0845  CKMB  --  2.0  --   TROPONINI <0.03 <0.03 <0.03   ------------------------------------------------------------------------------------------------------------------    Component Value Date/Time   BNP 71.2 05/30/2018 2347    Inpatient Medications  Scheduled Meds: . ALPRAZolam  0.5 mg Oral TID  . atorvastatin  20 mg Oral QHS  . azithromycin  500 mg Oral Daily  . clopidogrel  75 mg Oral Daily  . FLUoxetine  10 mg Oral q morning - 10a  . FLUoxetine  40 mg Oral Daily  .  gabapentin  900 mg Oral TID  . guaiFENesin-dextromethorphan  5 mL Oral Q6H  . heparin  5,000 Units Subcutaneous Q8H  . insulin aspart  0-15 Units Subcutaneous TID WC  . insulin aspart  0-5 Units Subcutaneous QHS  . insulin glargine  50 Units Subcutaneous BID  . ipratropium-albuterol  3 mL Nebulization TID  . isosorbide mononitrate  60 mg Oral Daily  . nicotine  14 mg Transdermal Daily  . oxybutynin  10 mg Oral Daily  . pantoprazole  40 mg Oral Daily  . tamsulosin  0.4 mg Oral Daily   Continuous Infusions: . cefTRIAXone (ROCEPHIN)  IV Stopped (06/01/18 1030)   PRN Meds:.albuterol, nitroGLYCERIN, oxyCODONE-acetaminophen **AND** oxyCODONE  Micro Results Recent Results (from the past 240 hour(s))  Culture, blood (routine x 2)     Status: None (Preliminary result)   Collection Time: 05/31/18  8:57 AM  Result Value Ref Range Status   Specimen Description   Final    BLOOD RIGHT ANTECUBITAL Performed at [facility], [place] [address], [place], [place] [address]    Special Requests  Final    BOTTLES DRAWN AEROBIC AND ANAEROBIC Blood Culture adequate volume Performed at [facility] [address], [place], [place] [address]    Culture   Final    NO GROWTH < 24 HOURS Performed at [facility] [address] [place], [place] [address]    Report Status PENDING  Incomplete  Culture, blood (routine x 2)     Status: None (Preliminary result)   Collection Time: 05/31/18  9:02 AM  Result Value Ref Range Status   Specimen Description   Final    BLOOD LEFT FOREARM Performed at [facility] [address], [place], [place] [address]    Special Requests   Final    BOTTLES DRAWN AEROBIC AND ANAEROBIC Blood Culture adequate volume Performed at [facility] [address], [place], [place] [address]    Culture   Final    NO GROWTH < 24 HOURS Performed at [facility] [address] [place], [place] [address]      Report Status PENDING  Incomplete  MRSA PCR Screening     Status: None   Collection Time: 05/31/18  3:58 PM  Result Value Ref Range Status   MRSA by PCR NEGATIVE NEGATIVE Final    Comment:        The GeneXpert MRSA Assay (FDA approved for NASAL specimens only), is one component of a comprehensive MRSA colonization surveillance program. It is not intended to diagnose MRSA infection nor to guide or monitor treatment for MRSA infections. Performed at [facility], [place] [address] [place], [place] [address]     Radiology Reports Dg Chest 2 View  Result Date: 05/30/2018 CLINICAL DATA:  Shortness of breath. EXAM: CHEST - 2 VIEW COMPARISON:  Multiple chest x-rays since February 14, 2018 FINDINGS: Effusion and opacity in the left base is stable. No pneumothorax. The cardiomediastinal silhouette is stable. No other acute abnormalities identified. IMPRESSION: Stable opacity and effusion on the left. Electronically Signed   By: Dorise Bullion III M.D   On: 05/30/2018 23:40   Dg Chest 2 View  Result Date: 05/27/2018 CLINICAL DATA:  Acute onset of chest pain and chest congestion. Hyperglycemia. Current history of COPD. EXAM: CHEST - 2 VIEW COMPARISON:  03/10/2018, 02/14/2018 and earlier, including CT chest 11/26/2017. FINDINGS: Cardiac silhouette normal in size, unchanged. Thoracic aorta atherosclerotic, unchanged. Hilar and mediastinal contours otherwise unremarkable. Pleural thickening involving the LEFT lateral hemithorax. Pleuroparenchymal scarring at the LEFT lung base. Linear atelectasis involving the INFERIOR LEFT UPPER LOBE. Lungs otherwise clear. Emphysematous changes in the UPPER lobes as noted previously. No pleural effusions. Degenerative changes involving the thoracic spine. IMPRESSION: 1. Linear atelectasis involving the INFERIOR LEFT UPPER LOBE. No acute cardiopulmonary disease otherwise. 2. Stable pleural thickening involving the LEFT lateral hemithorax and stable  pleuroparenchymal scarring at the LEFT base. 3. Aortic Atherosclerosis (ICD10-I70.0) and Emphysema (ICD10-J43.9). Electronically Signed   By: Evangeline Dakin M.D.   On: 05/27/2018 13:14   Ct Head Wo Contrast  Result Date: 05/27/2018 CLINICAL DATA:  Numbness tingling paresthesia.  Diabetes EXAM: CT HEAD WITHOUT CONTRAST TECHNIQUE: Contiguous axial images were obtained from the base of the skull through the vertex without intravenous contrast. COMPARISON:  CT head 12/14/2008 FINDINGS: Brain: Chronic infarct right frontal lobe is unchanged. Ventricle size is normal. Negative for acute infarct, hemorrhage, or mass. No change from the prior study. Vascular: Negative for hyperdense vessel Skull: Negative Sinuses/Orbits: Negative Other: None IMPRESSION: Chronic infarct  right frontal lobe unchanged.  No acute abnormality. Electronically Signed   By: Franchot Gallo M.D.   On: 05/27/2018 13:38    Time Spent in minutes  25   Marchia Diguglielmo M.D on 06/01/2018 at 12:11 PM  Between 7am to 7pm - Pager - [phone]  After 7pm go to [email or web address] - password [facility]  [facility] -  Office  [phone]

## 2018-06-01 NOTE — Progress Notes (Signed)
Received orders from on call provider K. Schorr, 233m bolus, blood gas, order for narcan. Pt BP increasing but still lethargic.

## 2018-06-01 NOTE — Progress Notes (Signed)
Pt more lethargic, "feeling awful" states head hurts, and feels "achy" pt still arousabe but more drowsy.

## 2018-06-01 NOTE — Progress Notes (Signed)
CRITICAL VALUE ALERT  Critical Value:  Lactic Acid is 2.1  Date & Time Notied:  06/01/2018 6:03 AM   Provider Notified: Lamar Blinks NP  Orders Received/Actions taken: 6:03 AM

## 2018-06-01 NOTE — Progress Notes (Signed)
Inpatient Diabetes Program Recommendations  AACE/ADA: New Consensus Statement on Inpatient Glycemic Control (2015)  Target Ranges:  Prepandial:   less than 140 mg/dL      Peak postprandial:   less than 180 mg/dL (1-2 hours)      Critically ill patients:  140 - 180 mg/dL   Lab Results  Component Value Date   GLUCAP 246 (H) 06/01/2018   HGBA1C 12.2 (H) 06/01/2018    Review of Glycemic Control  Diabetes history: DM2 Outpatient Diabetes medications: Lantus 110 units bid Current orders for Inpatient glycemic control: Lantus 50 units bid, Novolog 0-15 units tidwc and hs  HgbA1C - 12.2% - uncontrolled. Has been on and off steroids at home. Started CHO mod diet this am. Will likely need some meal coverage insulin.  Inpatient Diabetes Program Recommendations:    Increase Lantus to 75 units bid Add Novolog 6 units tidwc for meal coverage insulin if pt eats > 50% meal.  Will continue to follow. Not appropriate at present to discuss HgbA1C results.  Thank you. Lorenda Peck, RD, LDN, CDE Inpatient Diabetes Coordinator [phone]

## 2018-06-01 NOTE — Progress Notes (Signed)
Pt given 0.4 mg narcan at this time, pt confirmed had narcan before and "don't like how it makes me feel it makes me feel crazy"  This RN along with charge RN Belma at bedside, continuing to monitor. Respiratory called to get ABGs. ABG drawn, bolus finished, pt more alert after narcan administration, will continue to monitor.

## 2018-06-01 NOTE — Progress Notes (Signed)
RN pushing PO fluids at this time, pt tolerating well, BP now 111/81, no orders yet. Will continue to monitor, all narcotics held at this time.

## 2018-06-01 NOTE — Progress Notes (Addendum)
Pt stating "I don't feel good" lethargic, drowsy, responds to verbal stimuli, diaphoretic, ill and disheveled looking. BP 94/63 no fluids ordered cbg 281. Oncall provider paged, awaiting orders.

## 2018-06-02 DIAGNOSIS — Z794 Long term (current) use of insulin: Secondary | ICD-10-CM

## 2018-06-02 DIAGNOSIS — G9341 Metabolic encephalopathy: Secondary | ICD-10-CM

## 2018-06-02 DIAGNOSIS — R0789 Other chest pain: Secondary | ICD-10-CM

## 2018-06-02 DIAGNOSIS — E1165 Type 2 diabetes mellitus with hyperglycemia: Secondary | ICD-10-CM

## 2018-06-02 DIAGNOSIS — Z79899 Other long term (current) drug therapy: Secondary | ICD-10-CM

## 2018-06-02 LAB — GLUCOSE, CAPILLARY
GLUCOSE-CAPILLARY: 315 mg/dL — AB (ref 70–99)
Glucose-Capillary: 145 mg/dL — ABNORMAL HIGH (ref 70–99)
Glucose-Capillary: 158 mg/dL — ABNORMAL HIGH (ref 70–99)
Glucose-Capillary: 176 mg/dL — ABNORMAL HIGH (ref 70–99)
Glucose-Capillary: 201 mg/dL — ABNORMAL HIGH (ref 70–99)

## 2018-06-02 LAB — BASIC METABOLIC PANEL
Anion gap: 8 (ref 5–15)
BUN: 10 mg/dL (ref 8–23)
CALCIUM: 8.6 mg/dL — AB (ref 8.9–10.3)
CHLORIDE: 103 mmol/L (ref 98–111)
CO2: 28 mmol/L (ref 22–32)
CREATININE: 1.27 mg/dL — AB (ref 0.61–1.24)
GFR calc non Af Amer: 59 mL/min — ABNORMAL LOW (ref >60)
Glucose, Bld: 156 mg/dL — ABNORMAL HIGH (ref 70–99)
Potassium: 4 mmol/L (ref 3.5–5.1)
SODIUM: 139 mmol/L (ref 135–145)

## 2018-06-02 LAB — BLOOD GAS, ARTERIAL
Acid-Base Excess: 2.2 mmol/L — ABNORMAL HIGH (ref 0.0–2.0)
Bicarbonate: 28 mmol/L (ref 20.0–28.0)
Drawn by: 235321
O2 Content: 6 L/min
O2 Saturation: 70 %
Patient temperature: 98.6
pCO2 arterial: 51.5 mmHg — ABNORMAL HIGH (ref 32.0–48.0)
pH, Arterial: 7.354 (ref 7.350–7.450)
pO2, Arterial: 39.3 mmHg — CL (ref 83.0–108.0)

## 2018-06-02 MED ORDER — NALOXONE HCL 0.4 MG/ML IJ SOLN: 0.4000 mg | INTRAMUSCULAR | Status: DC | PRN

## 2018-06-02 MED ORDER — INSULIN GLARGINE 100 UNIT/ML ~~LOC~~ SOLN
70.0000 [IU] | Freq: Two times a day (BID) | SUBCUTANEOUS | Status: DC
  Administered 2018-06-02 – 2018-06-03 (×2): 70 [IU] via SUBCUTANEOUS
  Filled 2018-06-02 (×3): qty 0.7

## 2018-06-02 MED ORDER — INSULIN ASPART 100 UNIT/ML ~~LOC~~ SOLN: 8.0000 [IU] | Freq: Three times a day (TID) | SUBCUTANEOUS | Status: DC

## 2018-06-02 MED ORDER — INSULIN ASPART 100 UNIT/ML ~~LOC~~ SOLN: 6.0000 [IU] | Freq: Three times a day (TID) | SUBCUTANEOUS | Status: DC

## 2018-06-02 MED ORDER — INSULIN GLARGINE 100 UNIT/ML ~~LOC~~ SOLN: 75.0000 [IU] | Freq: Two times a day (BID) | SUBCUTANEOUS | Status: DC

## 2018-06-02 NOTE — Significant Event (Addendum)
Rapid Response Event Note  Overview: Time Called: 0117 Arrival Time: 0118 Event Type: Hypotension, Neurologic  Initial Focused Assessment: Patient having new onset hypotension and lethargic. Patient hard to arouse. Upon my arrival RN had given patient one dose of Narcan. NP on phone with primary RN, and ordered second dose of Narcan to be given. Per RN patient woke up following first dose of Narcan, but then shortly after became drowsy again. Second dose of Narcan given. Patient denies taking any medication or substance not given to him by RN. Patient BP improved upon my arrival, current BP 115/81, HR 73, 96% on 6LNC. Additional vital signs documented in flowsheet.  Interventions: CBG checked: 201 EKG check due to patient having cardiac history.  Telemonitor ordered  Plan of Care (if not transferred): Patient awake and easily aroused upon my departure. RN to continue to monitor patient for changes in mentation. RN to be cautious when administering medication that could create a sedative effect. RN to contact rapid response for further needs.   Event Summary:  [name]

## 2018-06-02 NOTE — Progress Notes (Signed)
Wilburn Cornelia, RN RRN at bedside, pt received second dose of narcan-responded well, more alert, less lethargic, pt cbg 201, EKG performed. Pt VS stable. Ordered Telesitter to monitor pt for possible substance use in hospital at this time. Pt urine collected to do tox screen. Will continue to monitor closely.

## 2018-06-02 NOTE — Progress Notes (Signed)
Inpatient Diabetes Program Recommendations  AACE/ADA: New Consensus Statement on Inpatient Glycemic Control (2015)  Target Ranges:  Prepandial:   less than 140 mg/dL      Peak postprandial:   less than 180 mg/dL (1-2 hours)      Critically ill patients:  140 - 180 mg/dL   Lab Results  Component Value Date   GLUCAP 315 (H) 06/02/2018   HGBA1C 12.2 (H) 06/01/2018    Review of Glycemic Control  Post-prandial blood sugars > 180 mg/dL. Needs increase in meal coverage insulin.  Inpatient Diabetes Program Recommendations:     Increase Novolog to 8 units tidwc for meal coverage insulin.  Will continue to follow closely.  Thank you. Lorenda Peck, RD, LDN, CDE Inpatient Diabetes Coordinator [phone]

## 2018-06-02 NOTE — Progress Notes (Signed)
PROGRESS NOTE                                                                                                                                                                                                             Patient Demographics:    [name], is a 62 y.o. male, DOB - 06-04-1956, VOH:[id number]  Admit date - 05/30/2018   Admitting Physician Shelly Coss, MD  Outpatient Primary MD for the patient is [name]  LOS - 2  Outpatient Specialists: cardiology  Chief Complaint  Patient presents with  . Hyperglycemia  . Shortness of Breath       Brief Narrative   62 year old obese male with history of renal cell carcinoma status post right radical nephrectomy in 2015, interstitial lung disease with chronic respiratory failure on 5-7 L home O2, diastolic CHF, CAD with history of stenting in 2014, history of stroke, hyperlipidemia, uncontrolled type 2 diabetes mellitus with A1c >12 presented to the ED with increasing shortness of breath for past 4 days.  Patient has chronic dyspnea but symptoms worsened on presentation.  Also reported increased weakness and poor appetite.  In the ED he was hypotensive with O2 sat at 79% on 2 L, hyperglycemic in 400s.  Patient placed on nonrebreather and sats improved to 91%.  Labs showed elevated lactic acid with chest x-ray showing mild effusion and?  Opacity in the left lung base.  Patient admitted to stepdown unit and placed on IV fluids along with empiric antibiotic for possible pneumonia.   Subjective:   Patient denied further chest pain during the day yesterday.  During the night RRT was called as patient was lethargic and drowsy.  He was diaphoretic with blood pressure of 94/63 mmHg and CBG of 280.  ABG was done showing PO2 of 39 and PCO2 of 51.  He was then given 0.4 of Narcan after which symptoms were better. This morning he is sleepy but easily arousable.  Still  complains of pain in his right side of the chest.  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  Follows with Dr. Chase Caller.  Reportedly also been followed by palliative care at home. Suspected  pneumonia based on initial presentation and mild effusion/opacity in the left lung base.  On empiric antibiotics.  Cultures negative. Patient was hypoxic last night and currently stable on 5-6 L.  Continue scheduled DuoNeb.   Active Problems: Acute metabolic encephalopathy on 11/13 Suspect this is due to polypharmacy as patient is on oxycodone, high-dose gabapentin (900 mg 3 times daily) and Xanax at home.  All meds were held and given a dose of Narcan during the night.  Currently stable. Will resume his home meds with caution.  Patient reports taking oxycodone for his lung pain.    Diabetes mellitus type 2, uncontrolled, with cardiovascular complications, peripheral neuropathy A1c >12.  Suspect nonadherence to medication.  He was initially started on insulin drip that was discontinued and placed on twice daily Lantus (dose increased to 65 units twice daily) and added pre-meal aspart 5 units 3 times daily.  CBG stable this morning.  Titrate dose as needed.  (Patient at home is on 55 units of Lantus twice daily). Hold gabapentin.    Coronary atherosclerosis of native coronary artery Frequent chest pain symptoms.  EKG and serial troponin negative.  Imdur resumed.  I think most of his pain is musculoskeletal as it is reproducible.  Will add PRN Toradol.  I have resumed his Imdur.  ongoing tobacco use and needs counseling on cessation.  Also listed for cocaine use.  Check urine drug screen. Continue Plavix and statin.   Orthostatic hypotension/lactic acidosis. Suspected infection.  Has chronic low blood pressure.   Was 80s/50s on presentation and soft last night.  Continue to hold his home blood pressure meds except for Imdur.  Severe hypokalemia/hypomagnesia Replenished  Chronic COPD Has chronic symptoms.  No signs of exacerbation.  Continue nebs.    Tobacco abuse Counseled on cessation.  History of CVA with mild residual left-sided weakness. Continue statin.  Chronic diastolic CHF Currently euvolemic.  Of fluids.  Holding other home blood pressure medications.  Follow-up with outpatient cardiology.    Obstructive sleep apnea Not yet on CPAP since he has not been able to tolerate.    HLD (hyperlipidemia) Continue statin.    CKD (chronic kidney disease) stage 3, GFR 30-59 ml/min ([name]) Stable at baseline.  BPH Continue Flomax.   Generalized weakness PT evaluation.    Code Status : DNR  Family Communication  : None at bedside today.  Disposition Plan  : Home possibly tomorrow if symptoms improved.  Barriers For Discharge : Active symptoms  Consults  : None  Procedures  : None  DVT Prophylaxis  :  Lovenox -   Lab Results  Component Value Date   PLT 180 06/01/2018    Antibiotics  :    Anti-infectives (From admission, onward)   Start     Dose/Rate Route Frequency Ordered Stop   05/31/18 1000  azithromycin (ZITHROMAX) tablet 500 mg     500 mg Oral Daily 05/31/18 0857     05/31/18 0900  cefTRIAXone (ROCEPHIN) 1 g in sodium chloride 0.9 % 100 mL IVPB     1 g 200 mL/hr over 30 Minutes Intravenous Every 24 hours 05/31/18 0857          Objective:   Vitals:   06/02/18 0025 06/02/18 0030 06/02/18 0442 06/02/18 0753  BP: 109/82 100/77 119/79   Pulse: 73 76 79   Resp:   20   Temp:   97.9 F (36.6 C)   TempSrc:   Oral   SpO2: 96% 95% 95% 93%  Weight:  Height:        Wt Readings from Last 3 Encounters:  06/01/18 114.9 kg  03/10/18 105.1 kg  02/13/18 107.8 kg     Intake/Output Summary (Last 24 hours) at 06/02/2018 1037 Last data filed at 06/02/2018  0200 Gross per 24 hour  Intake 1280.92 ml  Output 650 ml  Net 630.92 ml    Physical exam Not in distress, fatigue HEENT: Moist mucosa, supple neck Chest: Scattered rhonchi bilaterally, reproducible pain on pressure over the chest CVs: Normal S1 and S2, no murmurs GI: Soft, nondistended, nontender Musculoskeletal: Warm, no edema CNS: Alert and oriented      Data Review:    CBC Recent Labs  Lab 05/27/18 1241 05/30/18 2347 05/31/18 0533 06/01/18 0321  WBC 9.6 13.1* 9.6 7.9  HGB 12.3* 12.6* 11.9* 11.9*  HCT 37.1* 38.3* 36.9* 37.4*  PLT 222 232 212 180  MCV 81.2 83.4 82.9 86.0  MCH 26.9 27.5 26.7 27.4  MCHC 33.2 32.9 32.2 31.8  RDW 12.9 13.1 13.2 13.0  LYMPHSABS 1.5 2.3 1.7  --   MONOABS 0.6 0.7 0.6  --   EOSABS 0.2 0.2 0.2  --   BASOSABS 0.0 0.1 0.0  --     Chemistries  Recent Labs  Lab 05/27/18 1241 05/30/18 2347 05/31/18 0533 06/01/18 0311 06/01/18 0321 06/02/18 0736  NA 132* 130* 138  --  137 139  K 2.6* 2.3* 2.9*  --  3.7 4.0  CL 92* 89* 97*  --  102 103  CO2 25 27 29   --  26 28  GLUCOSE 379* 395* 193*  --  250* 156*  BUN 17 14 13   --  14 10  CREATININE 1.79* 1.85* 1.60*  --  1.42* 1.27*  CALCIUM 8.0* 7.8* 7.4*  --  8.1* 8.6*  MG  --   --  1.2* 2.1  --   --   AST 19  --  18  --   --   --   ALT 12  --  11  --   --   --   ALKPHOS 104  --  71  --   --   --   BILITOT 0.5  --  0.4  --   --   --    ------------------------------------------------------------------------------------------------------------------ No results for input(s): CHOL, HDL, LDLCALC, TRIG, CHOLHDL, LDLDIRECT in the last 72 hours.  Lab Results  Component Value Date   HGBA1C 12.2 (H) 06/01/2018   ------------------------------------------------------------------------------------------------------------------ No results for input(s): TSH, T4TOTAL, T3FREE, THYROIDAB in the last 72 hours.  Invalid input(s):  FREET3 ------------------------------------------------------------------------------------------------------------------ No results for input(s): VITAMINB12, FOLATE, FERRITIN, TIBC, IRON, RETICCTPCT in the last 72 hours.  Coagulation profile No results for input(s): INR, PROTIME in the last 168 hours.  No results for input(s): DDIMER in the last 72 hours.  Cardiac Enzymes Recent Labs  Lab 05/31/18 1621 06/01/18 0845 06/01/18 1446 06/01/18 2037  CKMB 2.0  --   --   --   TROPONINI <0.03 <0.03 <0.03 <0.03   ------------------------------------------------------------------------------------------------------------------    Component Value Date/Time   BNP 71.2 05/30/2018 2347    Inpatient Medications  Scheduled Meds: . ALPRAZolam  0.5 mg Oral TID  . atorvastatin  20 mg Oral QHS  . azithromycin  500 mg Oral Daily  . clopidogrel  75 mg Oral Daily  . FLUoxetine  10 mg Oral q morning - 10a  . FLUoxetine  40 mg Oral Daily  . gabapentin  900 mg Oral TID  . guaiFENesin-dextromethorphan  5 mL Oral Q6H  . heparin  5,000 Units Subcutaneous Q8H  . insulin aspart  0-15 Units Subcutaneous TID WC  . insulin aspart  0-5 Units Subcutaneous QHS  . insulin aspart  5 Units Subcutaneous TID WC  . insulin glargine  65 Units Subcutaneous BID  . ipratropium-albuterol  3 mL Nebulization TID  . isosorbide mononitrate  60 mg Oral Daily  . nicotine  14 mg Transdermal Daily  . oxybutynin  10 mg Oral Daily  . pantoprazole  40 mg Oral Daily  . tamsulosin  0.4 mg Oral Daily   Continuous Infusions: . cefTRIAXone (ROCEPHIN)  IV 1 g (06/02/18 0841)   PRN Meds:.albuterol, naLOXone (NARCAN)  injection, naLOXone (NARCAN)  injection, nitroGLYCERIN, oxyCODONE-acetaminophen **AND** oxyCODONE  Micro Results Recent Results (from the past 240 hour(s))  Culture, blood (routine x 2)     Status: None (Preliminary result)   Collection Time: 05/31/18  8:57 AM  Result Value Ref Range Status   Specimen  Description   Final    BLOOD RIGHT ANTECUBITAL Performed at [facility], [place] [address], [place], [place] [address]    Special Requests   Final    BOTTLES DRAWN AEROBIC AND ANAEROBIC Blood Culture adequate volume Performed at [facility] [address], [place], [place] [address]    Culture   Final    NO GROWTH 2 DAYS Performed at [facility] [address], [place], [place] [address]    Report Status PENDING  Incomplete  Culture, blood (routine x 2)     Status: None (Preliminary result)   Collection Time: 05/31/18  9:02 AM  Result Value Ref Range Status   Specimen Description   Final    BLOOD LEFT FOREARM Performed at [facility] [address] [place], [place] [address]    Special Requests   Final    BOTTLES DRAWN AEROBIC AND ANAEROBIC Blood Culture adequate volume Performed at [facility] [address], [place], [place] [address]    Culture   Final    NO GROWTH 2 DAYS Performed at [facility] [address], [place], [place] [address]    Report Status PENDING  Incomplete  MRSA PCR Screening     Status: None   Collection Time: 05/31/18  3:58 PM  Result Value Ref Range Status   MRSA by PCR NEGATIVE NEGATIVE Final    Comment:        The GeneXpert MRSA Assay (FDA approved for NASAL specimens only), is one component of a comprehensive MRSA colonization surveillance program. It is not intended to diagnose MRSA infection nor to guide or monitor treatment for MRSA infections. Performed at [facility], [place] [address] [place], [place] [address]     Radiology Reports Dg Chest 2 View  Result Date: 05/30/2018 CLINICAL DATA:  Shortness of breath. EXAM: CHEST - 2 VIEW COMPARISON:  Multiple chest x-rays since February 14, 2018 FINDINGS: Effusion and opacity in the left base is stable. No pneumothorax. The cardiomediastinal silhouette is stable. No other acute abnormalities  identified. IMPRESSION: Stable opacity and effusion on the left. Electronically Signed   By: Dorise Bullion III M.D   On: 05/30/2018 23:40   Dg Chest 2 View  Result Date: 05/27/2018 CLINICAL DATA:  Acute onset of chest pain and chest congestion. Hyperglycemia. Current history of COPD. EXAM: CHEST - 2 VIEW COMPARISON:  03/10/2018, 02/14/2018 and earlier, including CT chest 11/26/2017. FINDINGS: Cardiac silhouette  normal in size, unchanged. Thoracic aorta atherosclerotic, unchanged. Hilar and mediastinal contours otherwise unremarkable. Pleural thickening involving the LEFT lateral hemithorax. Pleuroparenchymal scarring at the LEFT lung base. Linear atelectasis involving the INFERIOR LEFT UPPER LOBE. Lungs otherwise clear. Emphysematous changes in the UPPER lobes as noted previously. No pleural effusions. Degenerative changes involving the thoracic spine. IMPRESSION: 1. Linear atelectasis involving the INFERIOR LEFT UPPER LOBE. No acute cardiopulmonary disease otherwise. 2. Stable pleural thickening involving the LEFT lateral hemithorax and stable pleuroparenchymal scarring at the LEFT base. 3. Aortic Atherosclerosis (ICD10-I70.0) and Emphysema (ICD10-J43.9). Electronically Signed   By: Evangeline Dakin M.D.   On: 05/27/2018 13:14   Ct Head Wo Contrast  Result Date: 05/27/2018 CLINICAL DATA:  Numbness tingling paresthesia.  Diabetes EXAM: CT HEAD WITHOUT CONTRAST TECHNIQUE: Contiguous axial images were obtained from the base of the skull through the vertex without intravenous contrast. COMPARISON:  CT head 12/14/2008 FINDINGS: Brain: Chronic infarct right frontal lobe is unchanged. Ventricle size is normal. Negative for acute infarct, hemorrhage, or mass. No change from the prior study. Vascular: Negative for hyperdense vessel Skull: Negative Sinuses/Orbits: Negative Other: None IMPRESSION: Chronic infarct right frontal lobe unchanged.  No acute abnormality. Electronically Signed   By: Franchot Gallo M.D.    On: 05/27/2018 13:38    Time Spent in minutes  35   Azaria Stegman M.D on 06/02/2018 at 10:37 AM  Between 7am to 7pm - Pager - [phone]  After 7pm go to [email or web address] - password [facility]  [facility] -  Office  [phone]

## 2018-06-03 DIAGNOSIS — Z79899 Other long term (current) drug therapy: Secondary | ICD-10-CM

## 2018-06-03 DIAGNOSIS — E876 Hypokalemia: Secondary | ICD-10-CM

## 2018-06-03 DIAGNOSIS — Z720 Tobacco use: Secondary | ICD-10-CM

## 2018-06-03 DIAGNOSIS — E0865 Diabetes mellitus due to underlying condition with hyperglycemia: Secondary | ICD-10-CM

## 2018-06-03 DIAGNOSIS — G9341 Metabolic encephalopathy: Secondary | ICD-10-CM | POA: Diagnosis not present

## 2018-06-03 DIAGNOSIS — I951 Orthostatic hypotension: Secondary | ICD-10-CM

## 2018-06-03 DIAGNOSIS — Z794 Long term (current) use of insulin: Secondary | ICD-10-CM

## 2018-06-03 DIAGNOSIS — J189 Pneumonia, unspecified organism: Principal | ICD-10-CM

## 2018-06-03 HISTORY — DX: Metabolic encephalopathy: G93.41

## 2018-06-03 LAB — GLUCOSE, CAPILLARY
GLUCOSE-CAPILLARY: 207 mg/dL — AB (ref 70–99)
Glucose-Capillary: 158 mg/dL — ABNORMAL HIGH (ref 70–99)

## 2018-06-03 MED ORDER — OXYCODONE-ACETAMINOPHEN 10-325 MG PO TABS: 1.0000 | ORAL_TABLET | Freq: Four times a day (QID) | ORAL | 0 refills | Status: DC | PRN

## 2018-06-03 MED ORDER — AMOXICILLIN-POT CLAVULANATE 875-125 MG PO TABS: 1.0000 | ORAL_TABLET | Freq: Two times a day (BID) | ORAL | 0 refills | Status: AC

## 2018-06-03 MED ORDER — ACETAMINOPHEN 325 MG PO TABS: 650.0000 mg | ORAL_TABLET | Freq: Four times a day (QID) | ORAL | 0 refills | Status: AC | PRN

## 2018-06-03 MED ORDER — NICOTINE 14 MG/24HR TD PT24: 14.0000 mg | MEDICATED_PATCH | Freq: Every day | TRANSDERMAL | 0 refills | Status: DC

## 2018-06-03 MED ORDER — ALPRAZOLAM 0.5 MG PO TABS: 0.5000 mg | ORAL_TABLET | Freq: Three times a day (TID) | ORAL | 0 refills | Status: DC | PRN

## 2018-06-03 MED ORDER — GABAPENTIN 300 MG PO CAPS: 300.0000 mg | ORAL_CAPSULE | Freq: Three times a day (TID) | ORAL | Status: DC

## 2018-06-03 MED ORDER — TORSEMIDE 20 MG PO TABS: 40.0000 mg | ORAL_TABLET | Freq: Two times a day (BID) | ORAL | 3 refills | Status: DC

## 2018-06-03 NOTE — Care Management Note (Signed)
Case Management Note  Patient Details  Name: [name] MRN: [id number] Date of Birth: Nov 22, 1955  Subjective/Objective:                  discharged  Action/Plan: To home with hospice care currently in place  Expected Discharge Date:  06/03/18               Expected Discharge Plan:  Home w Hospice Care  In-House Referral:     Discharge planning Services  CM Consult  Post Acute Care Choice:    Choice offered to:     DME Arranged:    DME Agency:     HH Arranged:    [place] Agency:  [facility]   Status of Service:  Completed, signed off  If discussed at [place] of Stay Meetings, dates discussed:    Additional Comments:  Leeroy Cha, RN 06/03/2018, 11:49 AM

## 2018-06-03 NOTE — Discharge Summary (Signed)
Physician Discharge Summary  [name]:[id number] DOB: 27-Apr-1956 DOA: 05/30/2018  PCP: Camille Bal, PA-C  Admit date: 05/30/2018 Discharge date: 06/03/2018  Admitted From: Home Disposition: Home  Recommendations for Outpatient Follow-up:  1. Follow up with PCP in 1-2 weeks 2. Patient will complete 5-day course of antibiotic on 11/17   Home Health: None Equipment/Devices: Chronic oxygen 5-6 L, ambulates with a walker.  Discharge Condition: Fair CODE STATUS: DNR Diet recommendation: Heart Healthy / Carb Modified   Discharge Diagnoses:  Principal Problem:   Acute on chronic respiratory failure with hypoxia ([name])   Active Problems:   Diabetes mellitus type 2, uncontrolled, with cardiovascular complications   Tobacco abuse   Coronary atherosclerosis of native coronary artery   COLD (chronic obstructive lung disease) ([name])   ILD (interstitial lung disease) ([name])   Obstructive sleep apnea   HLD (hyperlipidemia)   Orthostatic hypotension   CKD (chronic kidney disease) stage 3, GFR 30-59 ml/min ([name])   Community acquired pneumonia   Acute metabolic encephalopathy   Polypharmacy   Diabetes mellitus due to underlying condition, uncontrolled, with hyperglycemia, with long-term current use of insulin ([name])   Brief narrative/HPI Please refer to admission H&P for details, in brief,62 year old obese male with history of renal cell carcinoma status post right radical nephrectomy in 2015, interstitial lung disease with chronic respiratory failure on 5-7 L home O2, diastolic CHF, CAD with history of stenting in 2014, history of stroke, hyperlipidemia, uncontrolled type 2 diabetes mellitus with A1c >12 presented to the ED with increasing shortness of breath for past 4 days.  Patient has chronic dyspnea but symptoms worsened on presentation.  Also reported increased weakness and poor appetite.  In the ED he was hypotensive with O2 sat at 79% on 2 L, hyperglycemic in 400s.   Patient placed on nonrebreather and sats improved to 91%.  Labs showed elevated lactic acid with chest x-ray showing mild effusion and?  Opacity in the left lung base.  Patient admitted to stepdown unit and placed on IV fluids along with empiric antibiotic for possible pneumonia.  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  Follows with Dr. Chase Caller.  Reportedly also been followed by palliative care at home. Suspected pneumonia based on initial presentation and mild effusion/opacity in the left lung base.   Placed on empiric Rocephin and azithromycin.  Cultures negative.  Will discharge on Augmentin to complete 5-day course. Sats stable on 5-6 L.  Continue nebs.   Active Problems: Acute metabolic encephalopathy on 11/13 Suspect this is due to polypharmacy as patient is on oxycodone, high-dose gabapentin (900 mg 3 times daily) and Xanax at home.    Given Narcan.  All meds were held. Patient reports that he takes his medicine for pain in his lungs.  Reduced oxycodone to every 6 hours as needed (instead of scheduled).  Also reduced gabapentin dose to 300 mg 3 times daily (was on 900 mg 3 times daily) and switched Xanax to as needed. Mental status at baseline.  Diabetes mellitus type 2, uncontrolled, with cardiovascular complications, peripheral neuropathy A1c >12.  Suspect nonadherence to medication.  He was initially started on insulin drip that was discontinued and placed on twice daily Lantus with pre-meal aspart 3 times daily.  CBG currently stable.  Patient on Lantus 110 units twice daily which she should continue.  Counseled on medication adherence  and monitoring his diet.    Coronary atherosclerosis of native coronary artery Frequent chest pain symptoms.  EKG and serial troponin negative.  Imdur resumed.   I think most of his pain is musculoskeletal as it is reproducible.    Added PRN Toradol with improvement.  Continue Plavix and statin.  Patient instructed to take Tylenol for pain rather than frequent use of sublingual nitro. Follow-up with cardiology.    Orthostatic hypotension/lactic acidosis. Low blood pressure on presentation which possibly is due to dehydration and infection.  Given IV fluids and now stable.  Resumed Cardizem and Imdur, resume torsemide at a lower dose.  Severe hypokalemia/hypomagnesia Replenished  Chronic COPD Has chronic symptoms.  No signs of exacerbation.  Continue nebs.    Tobacco abuse Counseled on cessation.  Nicotine patch prescribed.  History of CVA with mild residual left-sided weakness. Continue statin.  Chronic diastolic CHF Currently euvolemic.    Fluid discontinued.  Will reduce his dose of torsemide to 40 mg twice daily given dehydration and orthostasis..  Follow-up with outpatient cardiology.    Obstructive sleep apnea Not yet on CPAP since he has not been able to tolerate.    HLD (hyperlipidemia) Continue statin.    CKD (chronic kidney disease) stage 3, GFR 30-59 ml/min ([name]) Stable at baseline.  BPH Continue Flomax.       Family Communication  : None at bedside today.  Disposition Plan  : Home with resumed palliative care follow-up   Consults  : None  Procedures  : None   Discharge Instructions   Allergies as of 06/03/2018      Reactions   Oxycodone Itching, Nausea Only   Pt is able to tolerate if taken with benadryl      Medication List    STOP taking these medications   diphenhydrAMINE 25 MG tablet Commonly known as:  BENADRYL   predniSONE 10 MG tablet Commonly known as:  DELTASONE   zolpidem 5 MG tablet Commonly known as:  AMBIEN     TAKE these medications   acetaminophen 325 MG tablet Commonly known as:  TYLENOL Take 2 tablets (650 mg total) by mouth every 6 (six) hours as  needed for up to 15 days.   ALPRAZolam 0.5 MG tablet Commonly known as:  XANAX Take 1 tablet (0.5 mg total) by mouth 3 (three) times daily as needed for anxiety. What changed:    when to take this  reasons to take this   amoxicillin-clavulanate 875-125 MG tablet Commonly known as:  AUGMENTIN Take 1 tablet by mouth 2 (two) times daily for 2 days.   atorvastatin 20 MG tablet Commonly known as:  LIPITOR Take 20 mg by mouth at bedtime.   clopidogrel 75 MG tablet Commonly known as:  PLAVIX Take 1 tablet (75 mg total) by mouth daily.   diltiazem 240 MG 24 hr capsule Commonly known as:  CARDIZEM CD Take 240 mg by mouth daily.   FLUoxetine 40 MG capsule Commonly known as:  PROZAC Take 40 mg by mouth daily. Take with 10 mg capsule = 50 mg total   FLUoxetine 10 MG capsule Commonly known as:  PROZAC Take 10 mg by mouth every morning. Take with 40 mg capsule = 50 mg total   gabapentin 300 MG capsule Commonly known as:  NEURONTIN Take 1 capsule (300 mg total) by mouth 3 (three) times daily. What changed:  how much to take   Glycopyrrolate-Formoterol 9-4.8 MCG/ACT Aero Inhale 2 puffs into the lungs 2 (two) times  daily.   isosorbide mononitrate 60 MG 24 hr tablet Commonly known as:  IMDUR Take 1 tablet (60 mg total) by mouth daily.   LANTUS SOLOSTAR 100 UNIT/ML Solostar Pen Generic drug:  Insulin Glargine Inject 110 Units into the skin 2 (two) times daily.   nicotine 14 mg/24hr patch Commonly known as:  NICODERM CQ - dosed in mg/24 hours Place 1 patch (14 mg total) onto the skin daily. Start taking on:  06/04/2018   nitroGLYCERIN 0.4 MG SL tablet Commonly known as:  NITROSTAT PLACE ONE TABLET UNDER THE TONGUE EVERY FIVE MINUTES AS NEEDED FOR CHEST PAIN. IF 3 TABLETS ARE NECESSARY CALL 911 What changed:    how much to take  how to take this  when to take this  reasons to take this   oxybutynin 10 MG 24 hr tablet Commonly known as:  DITROPAN-XL Take 10 mg by  mouth daily.   oxyCODONE-acetaminophen 10-325 MG tablet Commonly known as:  PERCOCET Take 1 tablet by mouth every 6 (six) hours as needed for pain. What changed:  when to take this   pantoprazole 40 MG tablet Commonly known as:  PROTONIX Take 40 mg by mouth daily.   PROBIOTIC PO Take 1 capsule by mouth daily.   tamsulosin 0.4 MG Caps capsule Commonly known as:  FLOMAX Take 0.4 mg by mouth daily.   torsemide 20 MG tablet Commonly known as:  DEMADEX Take 2 tablets (40 mg total) by mouth 2 (two) times daily. What changed:  how much to take      Follow-up Information    Camille Bal, PA-C. Schedule an appointment as soon as possible for a visit in 1 week(s).   Specialty:  Physician Assistant Contact information: [address] [place] [address] [phone]          Allergies  Allergen Reactions  . Oxycodone Itching and Nausea Only    Pt is able to tolerate if taken with benadryl      Procedures/Studies: Dg Chest 2 View  Result Date: 05/30/2018 CLINICAL DATA:  Shortness of breath. EXAM: CHEST - 2 VIEW COMPARISON:  Multiple chest x-rays since February 14, 2018 FINDINGS: Effusion and opacity in the left base is stable. No pneumothorax. The cardiomediastinal silhouette is stable. No other acute abnormalities identified. IMPRESSION: Stable opacity and effusion on the left. Electronically Signed   By: Dorise Bullion III M.D   On: 05/30/2018 23:40   Dg Chest 2 View  Result Date: 05/27/2018 CLINICAL DATA:  Acute onset of chest pain and chest congestion. Hyperglycemia. Current history of COPD. EXAM: CHEST - 2 VIEW COMPARISON:  03/10/2018, 02/14/2018 and earlier, including CT chest 11/26/2017. FINDINGS: Cardiac silhouette normal in size, unchanged. Thoracic aorta atherosclerotic, unchanged. Hilar and mediastinal contours otherwise unremarkable. Pleural thickening involving the LEFT lateral hemithorax. Pleuroparenchymal scarring at the LEFT lung base. Linear atelectasis  involving the INFERIOR LEFT UPPER LOBE. Lungs otherwise clear. Emphysematous changes in the UPPER lobes as noted previously. No pleural effusions. Degenerative changes involving the thoracic spine. IMPRESSION: 1. Linear atelectasis involving the INFERIOR LEFT UPPER LOBE. No acute cardiopulmonary disease otherwise. 2. Stable pleural thickening involving the LEFT lateral hemithorax and stable pleuroparenchymal scarring at the LEFT base. 3. Aortic Atherosclerosis (ICD10-I70.0) and Emphysema (ICD10-J43.9). Electronically Signed   By: Evangeline Dakin M.D.   On: 05/27/2018 13:14   Ct Head Wo Contrast  Result Date: 05/27/2018 CLINICAL DATA:  Numbness tingling paresthesia.  Diabetes EXAM: CT HEAD WITHOUT CONTRAST TECHNIQUE: Contiguous axial images  were obtained from the base of the skull through the vertex without intravenous contrast. COMPARISON:  CT head 12/14/2008 FINDINGS: Brain: Chronic infarct right frontal lobe is unchanged. Ventricle size is normal. Negative for acute infarct, hemorrhage, or mass. No change from the prior study. Vascular: Negative for hyperdense vessel Skull: Negative Sinuses/Orbits: Negative Other: None IMPRESSION: Chronic infarct right frontal lobe unchanged.  No acute abnormality. Electronically Signed   By: Franchot Gallo M.D.   On: 05/27/2018 13:38       Subjective: Reports his breathing to be much better denies further chest pain symptoms.  No overnight issues.  Discharge Exam: Vitals:   06/03/18 0418 06/03/18 0925  BP: (!) 133/91   Pulse: 72   Resp: 18   Temp: 98 F (36.7 C)   SpO2: 99% 98%   Vitals:   06/02/18 2034 06/02/18 2215 06/03/18 0418 06/03/18 0925  BP:  (!) 167/70 (!) 133/91   Pulse:  83 72   Resp:  20 18   Temp:  98.2 F (36.8 C) 98 F (36.7 C)   TempSrc:  Oral Oral   SpO2: 96% 97% 99% 98%  Weight:      Height:        General: Not in distress HEENT: Moist mucosa, supple neck Chest: Clear bilaterally CVs: Normal S1-S2, no murmurs GI: Soft,  nondistended, nontender Musculoskeletal: Warm, no edema    The results of significant diagnostics from this hospitalization (including imaging, microbiology, ancillary and laboratory) are listed below for reference.     Microbiology: Recent Results (from the past 240 hour(s))  Culture, blood (routine x 2)     Status: None (Preliminary result)   Collection Time: 05/31/18  8:57 AM  Result Value Ref Range Status   Specimen Description   Final    BLOOD RIGHT ANTECUBITAL Performed at [facility], [place] [address], [place], [place] [address]    Special Requests   Final    BOTTLES DRAWN AEROBIC AND ANAEROBIC Blood Culture adequate volume Performed at [place] [address], [place], [place] [address]    Culture   Final    NO GROWTH 3 DAYS Performed at [facility], [place] [address] [place], [place] [address]    Report Status PENDING  Incomplete  Culture, blood (routine x 2)     Status: None (Preliminary result)   Collection Time: 05/31/18  9:02 AM  Result Value Ref Range Status   Specimen Description   Final    BLOOD LEFT FOREARM Performed at [place] [address] [place], [place] [address]    Special Requests   Final    BOTTLES DRAWN AEROBIC AND ANAEROBIC Blood Culture adequate volume Performed at [place] [address] [place], [place] [address]    Culture   Final    NO GROWTH 3 DAYS Performed at [facility], [place] [address] [place], [place] [address]    Report Status PENDING  Incomplete  MRSA PCR Screening     Status: None   Collection Time: 05/31/18  3:58 PM  Result Value Ref Range Status   MRSA by PCR NEGATIVE NEGATIVE Final    Comment:        The GeneXpert MRSA Assay (FDA approved for NASAL specimens only), is one component of a comprehensive MRSA colonization surveillance program. It is not intended to diagnose MRSA infection nor to guide or monitor treatment for MRSA  infections. Performed at [facility], [place] [address] [place], [place] [address]      Labs: BNP (last 3 results) Recent Labs    05/30/18 2347  BNP 19.5   Basic Metabolic Panel: Recent Labs  Lab 05/27/18 1241 05/30/18 2347 05/31/18 0533 06/01/18 0311 06/01/18 0321 06/02/18 0736  NA 132* 130* 138  --  137 139  K 2.6* 2.3* 2.9*  --  3.7 4.0  CL 92* 89* 97*  --  102 103  CO2 25 27 29   --  26 28  GLUCOSE 379* 395* 193*  --  250* 156*  BUN 17 14 13   --  14 10  CREATININE 1.79* 1.85* 1.60*  --  1.42* 1.27*  CALCIUM 8.0* 7.8* 7.4*  --  8.1* 8.6*  MG  --   --  1.2* 2.1  --   --    Liver Function Tests: Recent Labs  Lab 05/27/18 1241 05/31/18 0533  AST 19 18  ALT 12 11  ALKPHOS 104 71  BILITOT 0.5 0.4  PROT 6.8 6.4*  ALBUMIN 3.2* 2.9*   No results for input(s): LIPASE, AMYLASE in the last 168 hours. No results for input(s): AMMONIA in the last 168 hours. CBC: Recent Labs  Lab 05/27/18 1241 05/30/18 2347 05/31/18 0533 06/01/18 0321  WBC 9.6 13.1* 9.6 7.9  NEUTROABS 7.2 9.8* 7.0  --   HGB 12.3* 12.6* 11.9* 11.9*  HCT 37.1* 38.3* 36.9* 37.4*  MCV 81.2 83.4 82.9 86.0  PLT 222 232 212 180   Cardiac Enzymes: Recent Labs  Lab 05/31/18 0533 05/31/18 1621 06/01/18 0845 06/01/18 1446 06/01/18 2037  CKTOTAL  --  35*  --   --   --   CKMB  --  2.0  --   --   --   TROPONINI <0.03 <0.03 <0.03 <0.03 <0.03   BNP: Invalid input(s): POCBNP CBG: Recent Labs  Lab 06/02/18 0018 06/02/18 0735 06/02/18 1201 06/02/18 1638 06/02/18 2210  GLUCAP 201* 145* 315* 176* 158*   D-Dimer No results for input(s): DDIMER in the last 72 hours. Hgb A1c Recent Labs    06/01/18 0321  HGBA1C 12.2*   Lipid Profile No results for input(s): CHOL, HDL, LDLCALC, TRIG, CHOLHDL, LDLDIRECT in the last 72 hours. Thyroid function studies No results for input(s): TSH, T4TOTAL, T3FREE, THYROIDAB in the last 72 hours.  Invalid input(s): FREET3 Anemia work up No  results for input(s): VITAMINB12, FOLATE, FERRITIN, TIBC, IRON, RETICCTPCT in the last 72 hours. Urinalysis    Component Value Date/Time   COLORURINE STRAW (A) 05/31/2018 0212   APPEARANCEUR CLEAR 05/31/2018 0212   LABSPEC 1.001 (L) 05/31/2018 0212   PHURINE 6.0 05/31/2018 0212   GLUCOSEU >=500 (A) 05/31/2018 0212   HGBUR NEGATIVE 05/31/2018 0212   BILIRUBINUR NEGATIVE 05/31/2018 0212   KETONESUR NEGATIVE 05/31/2018 0212   PROTEINUR NEGATIVE 05/31/2018 0212   UROBILINOGEN 0.2 02/28/2015 1310   NITRITE NEGATIVE 05/31/2018 0212   LEUKOCYTESUR NEGATIVE 05/31/2018 0212   Sepsis Labs Invalid input(s): PROCALCITONIN,  WBC,  LACTICIDVEN Microbiology Recent Results (from the past 240 hour(s))  Culture, blood (routine x 2)     Status: None (Preliminary result)   Collection Time: 05/31/18  8:57 AM  Result Value Ref Range Status   Specimen Description   Final    BLOOD RIGHT ANTECUBITAL Performed at [facility], [place] [address] [place],  [address]    Special Requests   Final    BOTTLES DRAWN AEROBIC AND ANAEROBIC Blood Culture adequate volume Performed at [facility]  [place] [address] [place], [place] [address]    Culture   Final    NO GROWTH 3 DAYS Performed at [facility], [place] [address] [place], [place] [address]    Report Status PENDING  Incomplete  Culture, blood (routine x 2)     Status: None (Preliminary result)   Collection Time: 05/31/18  9:02 AM  Result Value Ref Range Status   Specimen Description   Final    BLOOD LEFT FOREARM Performed at [facility] [address], [place], [place] [address]    Special Requests   Final    BOTTLES DRAWN AEROBIC AND ANAEROBIC Blood Culture adequate volume Performed at [place] [facility] [address], [place], [place] [address]    Culture   Final    NO GROWTH 3 DAYS Performed at [facility], [place] [address] [place], [place] [address]    Report Status  PENDING  Incomplete  MRSA PCR Screening     Status: None   Collection Time: 05/31/18  3:58 PM  Result Value Ref Range Status   MRSA by PCR NEGATIVE NEGATIVE Final    Comment:        The GeneXpert MRSA Assay (FDA approved for NASAL specimens only), is one component of a comprehensive MRSA colonization surveillance program. It is not intended to diagnose MRSA infection nor to guide or monitor treatment for MRSA infections. Performed at [facility], [place] [address] [place],  [address]      Time coordinating discharge: 35 minutes  SIGNED:   Louellen Molder, MD  [facility] 06/03/2018, 11:06 AM Pager   If 7PM-7AM, please contact night-coverage [email or web address] Password [email or web address]

## 2018-06-07 LAB — CULTURE, BLOOD (ROUTINE X 2)
Culture: NO GROWTH
Culture: NO GROWTH
Special Requests: ADEQUATE
Special Requests: ADEQUATE

## 2018-06-23 ENCOUNTER — Ambulatory Visit: Payer: Medicaid Other | Admitting: Internal Medicine

## 2018-06-23 ENCOUNTER — Other Ambulatory Visit: Payer: Self-pay | Admitting: *Deleted

## 2018-06-23 MED ORDER — NITROGLYCERIN 0.4 MG SL SUBL: SUBLINGUAL_TABLET | SUBLINGUAL | 3 refills | Status: DC

## 2018-06-24 ENCOUNTER — Other Ambulatory Visit: Payer: Self-pay | Admitting: Cardiology

## 2018-07-07 ENCOUNTER — Encounter: Payer: Self-pay | Admitting: *Deleted

## 2018-07-07 NOTE — Progress Notes (Deleted)
Cardiology Office Note  Date: 07/07/2018   ID: [name], [name] 07/08/1956, MRN [id number]  PCP: Camille Bal, PA-C  Primary Cardiologist: [name], MD   No chief complaint on file.   History of Present Illness: [name] is a 62 y.o. male last seen in June.  Past Medical History:  Diagnosis Date  . Anxiety   . Arthritis   . Cholecystitis, acute 12/20/2013   Lap chole 6/5  . Cocaine abuse ([place])   . COPD (chronic obstructive pulmonary disease) ([place])   . Coronary atherosclerosis of native coronary artery    a. 03/09/2013 Cath/PCI: LM nl, LAD: 50p, 12m (2.5x16 promus DES), LCX nl, OM1 min irregs, LPL/LPDA diff dzs, RCA nondom, mod diff dzs, EF 55%.  . Depression   . Essential hypertension, benign   . GERD (gastroesophageal reflux disease)   . Headache   . Hepatitis Late 1970s  . History of nephrolithiasis   . History of pneumonia   . History of stroke    Right MCA distribution, residual left-sided weakness  . Hyperlipidemia   . Noncompliance   . Renal cell carcinoma [facility])    Status post radical right nephrectomy August 2015  . Sleep apnea    On CPAP, 4L O2 no cpap at home yet  . Type 2 diabetes mellitus ([place]) 2011    Past Surgical History:  Procedure Laterality Date  . APPENDECTOMY  1970's  . CHOLECYSTECTOMY N/A 12/22/2013   Procedure: LAPAROSCOPIC CHOLECYSTECTOMY ;  Surgeon: [name], MD;  Location: [place];  Service: General;  Laterality: N/A;  . ENDARTERECTOMY Right 10/08/2014   Procedure: Right ENDARTERECTOMY CAROTID;  Surgeon: [name], MD;  Location: [place];  Service: Vascular;  Laterality: Right;  . LAPAROSCOPIC LYSIS OF ADHESIONS  02/21/2014   Procedure: LAPAROSCOPIC LYSIS OF ADHESIONS EXTINSIVE;  Surgeon: [name], MD;  Location: [facility];  Service: Urology;;  . LEFT HEART CATHETERIZATION WITH CORONARY ANGIOGRAM N/A 06/02/2012   Procedure: LEFT HEART CATHETERIZATION WITH CORONARY ANGIOGRAM;  Surgeon: [name], MD;   Location: [facility] CATH LAB;  Service: Cardiovascular;  Laterality: N/A;  . LEFT HEART CATHETERIZATION WITH CORONARY ANGIOGRAM N/A 03/09/2013   Procedure: LEFT HEART CATHETERIZATION WITH CORONARY ANGIOGRAM;  Surgeon: [name], MD;  Location: [facility] CATH LAB;  Service: Cardiovascular;  Laterality: N/A;  . LEFT HEART CATHETERIZATION WITH CORONARY ANGIOGRAM N/A 12/20/2013   Procedure: LEFT HEART CATHETERIZATION WITH CORONARY ANGIOGRAM;  Surgeon: [name], MD;  Location: [facility] CATH LAB;  Service: Cardiovascular;  Laterality: N/A;  . LUNG BIOPSY Left 05/18/2014   Procedure: LUNG BIOPSY left upper lobe & left lower lobe;  Surgeon: [name], MD;  Location: [place];  Service: Thoracic;  Laterality: Left;  . PATCH ANGIOPLASTY Right 10/08/2014   Procedure: PATCH ANGIOPLASTY Right Carotid;  Surgeon: [name], MD;  Location: [place];  Service: Vascular;  Laterality: Right;  . ROBOT ASSISTED LAPAROSCOPIC NEPHRECTOMY Right 02/21/2014   Procedure: ROBOTIC ASSISTED LAPAROSCOPIC RIGHT NEPHRECTOMY ;  Surgeon: [name], MD;  Location: [facility];  Service: Urology;  Laterality: Right;  [name] VIDEO ASSISTED THORACOSCOPY Left 05/18/2014   Procedure: LEFT VIDEO ASSISTED THORACOSCOPY;  Surgeon: [name], MD;  Location: [place];  Service: Thoracic;  Laterality: Left;  [name] VIDEO BRONCHOSCOPY  05/18/2014   Procedure: VIDEO BRONCHOSCOPY;  Surgeon: [name], MD;  Location: [place];  Service: Thoracic;;    Current Outpatient Medications  Medication Sig Dispense Refill  . ALPRAZolam (  XANAX) 0.5 MG tablet Take 1 tablet (0.5 mg total) by mouth 3 (three) times daily as needed for anxiety.  0  . atorvastatin (LIPITOR) 20 MG tablet Take 20 mg by mouth at bedtime.  1  . clopidogrel (PLAVIX) 75 MG tablet Take 1 tablet (75 mg total) by mouth daily. 90 tablet 3  . diltiazem (CARDIZEM CD) 240 MG 24 hr capsule Take 240 mg by mouth daily.    [name] FLUoxetine (PROZAC) 10 MG capsule Take 10 mg by mouth every  morning. Take with 40 mg capsule = 50 mg total  0  . FLUoxetine (PROZAC) 40 MG capsule Take 40 mg by mouth daily. Take with 10 mg capsule = 50 mg total    . gabapentin (NEURONTIN) 300 MG capsule Take 1 capsule (300 mg total) by mouth 3 (three) times daily.    . Glycopyrrolate-Formoterol (BEVESPI AEROSPHERE) 9-4.8 MCG/ACT AERO Inhale 2 puffs into the lungs 2 (two) times daily. 1 Inhaler 11  . isosorbide mononitrate (IMDUR) 60 MG 24 hr tablet Take 1 tablet (60 mg total) by mouth daily. 90 tablet 3  . LANTUS SOLOSTAR 100 UNIT/ML Solostar Pen Inject 110 Units into the skin 2 (two) times daily.  4  . nicotine (NICODERM CQ - DOSED IN MG/24 HOURS) 14 mg/24hr patch Place 1 patch (14 mg total) onto the skin daily. 28 patch 0  . nitroGLYCERIN (NITROSTAT) 0.4 MG SL tablet PLACE ONE TABLET UNDER THE TONGUE EVERY FIVE MINUTES AS NEEDED FOR CHEST PAIN. IF 3 TABLETS ARE NECESSARY CALL 911 25 tablet 3  . oxybutynin (DITROPAN-XL) 10 MG 24 hr tablet Take 10 mg by mouth daily.  11  . oxyCODONE-acetaminophen (PERCOCET) 10-325 MG tablet Take 1 tablet by mouth every 6 (six) hours as needed for pain. 30 tablet 0  . pantoprazole (PROTONIX) 40 MG tablet Take 40 mg by mouth daily.    . Probiotic Product (PROBIOTIC PO) Take 1 capsule by mouth daily.    . tamsulosin (FLOMAX) 0.4 MG CAPS capsule Take 0.4 mg by mouth daily.     [name] torsemide (DEMADEX) 20 MG tablet TAKE THREE TABLETS BY MOUTH TWICE DAILY 620 tablet 3   No current facility-administered medications for this visit.    Allergies:  Oxycodone   Social History: The patient  reports that he has been smoking cigarettes. He started smoking about 46 years ago. He has a 20.50 pack-year smoking history. He has never used smokeless tobacco. He reports that he does not drink alcohol or use drugs.   Family History: The patient's family history includes CAD in his brother and father; Cancer in his maternal grandfather, maternal grandmother, and mother; Diabetes in an other  family member; Hypertension in an other family member; Lung cancer in his father; Stroke in an other family member.   ROS:  Please see the history of present illness. Otherwise, complete review of systems is positive for {NONE DEFAULTED:18576::"none"}.  All other systems are reviewed and negative.   Physical Exam: VS:  There were no vitals taken for this visit., BMI There is no height or weight on file to calculate BMI.  Wt Readings from Last 3 Encounters:  06/01/18 253 lb 4.9 oz (114.9 kg)  03/10/18 231 lb 9.6 oz (105.1 kg)  02/13/18 237 lb 9.6 oz (107.8 kg)    General: Patient appears comfortable at rest. HEENT: Conjunctiva and lids normal, oropharynx clear with moist mucosa. Neck: Supple, no elevated JVP or carotid bruits, no thyromegaly. Lungs: Clear to auscultation, nonlabored breathing at  rest. Cardiac: Regular rate and rhythm, no S3 or significant systolic murmur, no pericardial rub. Abdomen: Soft, nontender, no hepatomegaly, bowel sounds present, no guarding or rebound. Extremities: No pitting edema, distal pulses 2+. Skin: Warm and dry. Musculoskeletal: No kyphosis. Neuropsychiatric: Alert and oriented x3, affect grossly appropriate.  ECG: I personally reviewed the tracing from 06/02/2018 which showed sinus rhythm with PAC, low voltage in the limb leads and possible left atrial enlargement.  Recent Labwork: 05/30/2018: B Natriuretic Peptide 71.2 05/31/2018: ALT 11; AST 18 06/01/2018: Hemoglobin 11.9; Magnesium 2.1; Platelets 180 06/02/2018: BUN 10; Creatinine, Ser 1.27; Potassium 4.0; Sodium 139     Component Value Date/Time   CHOL 128 12/19/2013 0220   TRIG 152 (H) 12/19/2013 0220   HDL 31 (L) 12/19/2013 0220   CHOLHDL 4.1 12/19/2013 0220   VLDL 30 12/19/2013 0220   LDLCALC 67 12/19/2013 0220    Other Studies Reviewed Today:  Cardiac catheterization 12/20/2013: Hemodynamic Findings: Central aortic pressure: 120/62 Left ventricular pressure:  127/7/15  Angiographic Findings:  Left main: Short segment. No obstructive disease.   Left Anterior Descending Artery: Moderate caliber vessel that courses to the apex. There is 40% proximal stenosis. The mid stented segment is patent without restenosis. The distal vessel has diffuse non-obstructive plaque. The diagonal branches are small in caliber with no obstructive disease.   Circumflex Artery: Large dominant system with large bifurcating first obtuse marginal branch and three small caliber posterolateral branches. The OM branch has diffuse 50% proximal stenosis, no focally obstructive lesions. The three small caliber posterolateral branches all have diffuse moderate to severe stenosis that is unchanged from last cath.   Right Coronary Artery: Small non-dominant vessel with 40% mid vessel stenosis.   Left Ventricular Angiogram: LVEF=65%  Impression: 1. Double vessel CAD with patent stent mid LAD 2. Moderate non-obstructive disease in the Circumflex and RCA 3. Normal LV systolic function  Echocardiogram 07/05/2015: Study Conclusions  - Left ventricle: The cavity size was normal. Wall thickness was normal. Systolic function was normal. The estimated ejection fraction was in the range of 60% to 65%. Doppler parameters are consistent with abnormal left ventricular relaxation (grade 1 diastolic dysfunction).  Chest x-ray 05/30/2018: FINDINGS: Effusion and opacity in the left base is stable. No pneumothorax. The cardiomediastinal silhouette is stable. No other acute abnormalities identified.  IMPRESSION: Stable opacity and effusion on the left.  Assessment and Plan:    Current medicines were reviewed with the patient today.  No orders of the defined types were placed in this encounter.   Disposition:  Signed, Satira Sark, MD, [facility] 07/07/2018 2:45 PM    [facility] [place], [place], [place]  [address] Phone: [phone]; Fax: [phone]

## 2018-07-08 ENCOUNTER — Encounter: Payer: Self-pay | Admitting: Cardiology

## 2018-07-08 ENCOUNTER — Ambulatory Visit: Payer: Medicaid Other | Admitting: Cardiology

## 2018-07-29 ENCOUNTER — Other Ambulatory Visit (HOSPITAL_BASED_OUTPATIENT_CLINIC_OR_DEPARTMENT_OTHER): Payer: Self-pay | Admitting: Physician Assistant

## 2018-07-29 DIAGNOSIS — R188 Other ascites: Secondary | ICD-10-CM

## 2018-07-30 ENCOUNTER — Ambulatory Visit (HOSPITAL_BASED_OUTPATIENT_CLINIC_OR_DEPARTMENT_OTHER)
Admission: RE | Admit: 2018-07-30 | Discharge: 2018-07-30 | Disposition: A | Discharge status: Home / Self Care | Payer: Medicaid Other | Source: Ambulatory Visit | Attending: Physician Assistant | Admitting: Physician Assistant

## 2018-07-30 DIAGNOSIS — R188 Other ascites: Secondary | ICD-10-CM | POA: Insufficient documentation

## 2018-08-01 ENCOUNTER — Ambulatory Visit: Payer: Medicaid Other | Admitting: Internal Medicine

## 2018-08-01 ENCOUNTER — Telehealth: Payer: Self-pay | Admitting: Internal Medicine

## 2018-08-01 ENCOUNTER — Encounter: Payer: Self-pay | Admitting: Internal Medicine

## 2018-08-01 VITALS — BP 126/70 | HR 87 | Ht 69.0 in | Wt 241.8 lb

## 2018-08-01 DIAGNOSIS — J9611 Chronic respiratory failure with hypoxia: Secondary | ICD-10-CM | POA: Diagnosis not present

## 2018-08-01 DIAGNOSIS — J439 Emphysema, unspecified: Secondary | ICD-10-CM

## 2018-08-01 DIAGNOSIS — J8410 Pulmonary fibrosis, unspecified: Secondary | ICD-10-CM

## 2018-08-01 DIAGNOSIS — J849 Interstitial pulmonary disease, unspecified: Secondary | ICD-10-CM | POA: Diagnosis not present

## 2018-08-01 NOTE — Patient Instructions (Addendum)
ICD-10-CM   1. Chronic respiratory failure with hypoxia ([name]) J96.11   2. Interstitial pulmonary disease ([place]) J84.9   3. Pulmonary emphysema, unspecified emphysema type ([place]) J43.9   4. Pulmonary emphysema with fibrosis of lung ([place]) J43.9    J84.10     Unclear if stable or worse Unable to do walk test for o2 qualification because of fatigue and gait imbalance  PLAN - do HRCT suppine and prone (last sept 2018 and may 2019) - next few to several weeks - do spirometry and dlco - next few to several weeks - baesd on results of above and your other medical issues we can discuss  - starting new anti-fibrotic therapy (if you meet eligibility and risk of medicine is acceptable)  - participating in ILD-PRO registry study - will discuss your 2015 biopsy at our case conference  Followup - next few to several weeks in regular or ILD clinic; 30 min slot  - redoqualifiying walk next visit

## 2018-08-01 NOTE — Progress Notes (Signed)
HPI    OV 04/10/2015  Chief Complaint  Patient presents with  . Follow-up    Pt states his breathing has worsened since last OV. Pt was admitted to [facility] for CP and syncope. Pt c/o increase in DOE, prod cough with dark brown mucus, chest disfomfort when active.    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 At that point in time spirometry showed a significant decline in forced vital capacity from 4 L at baseline to 2.6 L. He was using 4 L of oxygen at rest and 6 L with exertion at home. However we could not convincingly document hypoxemia with exertion on room air in the office at that time. Therefore to this and I ordered pulmonary function tests and CT scan of the chest. He did have CT scan of the chest in August 2016. There is no report on progression compared to February 2016 but I suspect the findings are similar. While he underwent pulmonary function test he had cough syncope and was admitted to the hospital. Cardiology notes reviewed and the final diagnosis was cough syncope. He he now presents for follow-up. He continues to be miserable with class IV dyspnea. He uses 4-6 liters of oxygen because he subjectively feels the need to do that. He does not monitor his pulse ox at home. He says that he continues to have cough syncope at home and has fallen several times. He has now moved to live with his 67 year old mom. He is socially isolated otherwise. He feels like he needs an extra layer of help at home. There are no other issues  Pulse oximetry on room air at rest and walking desaturation test 185 feet 3 laps on room air: RA rest after 15 min off o2, pulse o98% and lowest pulse ox with forehead probe was 90%. He did get dizzy with coughing   OV 06/05/15 63 year old man with tobacco abuse and history of interstitial lung disease,  desquamative interstitial pneumonitis and burnt out Langerhans' cell histiocytosis, hypoxemic respiratory failure; untreated OSA unable to tolerate CPAP. He probably also has some degree of superimposed COPD.  He presents today with two weeks of increased dyspnea, . He is having paroxysms of cough, can lead to pre-syncope / syncope. Last was 2 days ago. He has LE edema, probable more than his baseline. He is using duoneb bid, symbicort bid, albuterol prn - averages once a day. He complains of dry mouth. He took extra lasix after last OV here with TP, helped his edema but not his dyspnea.     OV 07/05/2015  Chief Complaint  Patient presents with  . Follow-up    Pt states his SOB has worsened since last OV. Pt also c/o increase in nonprod cough, pt states he frequently coughs when eating or drinking. Pt c/o right lateral chest pain when coughing. Pt denies f/c/s.   Admit    63yo male smoker with  ILD(DIP)  on Oxygen .  Polysubstance abuse hx.   08/09/2015 [facility] follow up  Patient returns for a post hospital follow-up. Patient was recently admitted with acute on chronic respiratory failure with underlying interstitial lung disease for  acute on chronic diastolic heart failure. He improved with diuresis .  He says he is feeling improved. Has decreased shortness of breath. Unfortunately continues to smoke. We discussed smoking cessation. He denies any hemoptysis, chest pain,  orthopnea, PND, or increased leg swelling.    OV 10/14/2015  Chief Complaint  Patient presents with  . Follow-up    Still having SOB, coughing and mild wheezing. Pt states that he feels like fluid is coming back especially on left side.    Follow-up interstitial lung disease and chronic hypoxemic respiratory failure associated with volume overload and diastolic dysfunction and coronary artery disease and multiple medical problems including depression and anxiety and poor social situation  He presents for routine  follow-up. During stable health times of November been able to document hypoxemia and him on room air at rest. Also during admissions he has been hypoxemic. Again today on room air 10 minutes his pulse ox was 97%. He insists that he gets very dyspneic when he turns his oxygen off and he needs 5 L of oxygen at all times. He does not check his pulse ox at home so we do not know his true oxygen levels when he is off oxygen. He has class III-for dyspnea. He is on oxygen. He lives with his mom was 34 years old and takes care of him. Overall he feels stable. [place] symptom assessment score documented below and it is obvious that is significant problems are fatigue, depression, anxiety and shortness of breath. He is open to undergo home palliative care program  [facility] Symptom Assessment Numerical Scale 0 is no problem -> 10 worst problem 10/14/2015    No Pain -> Worst pain 0  No Tiredness -> Worset tiredness 10  No Nausea -> Worst nausea 0  No Depression -> Worst depression 7  No Anxiety -> Worst Anxiety 8  No Drowsiness -> Worst Drowsiness 10  Best appetite-> Worst Appetitle 3  Best Feeling of well being -> Worst feeling 5  No dyspnea-> Worst dyspnea 8  Other problem (none -> severe) 10 - chest discomfort, when active  Completed by  patient         Last CT chest December 2016 and when compared to August 2016 no change in DIC pattern of ILD  Chest x-ray 10/04/2015: Reported as chronic changes or jaw baseline. I do not have these image for visualization  Lab work 07/26/2015: Shows a creatinine of 1.68 mg percent with a GFR of 43. Hemoglobin 13 g percent.  Cardiology visit with Dr. Domenic Polite 08/28/2015: There is discussion about switching his Lasix and metolazone and Demadex. Volume overload is likely multifactorial with RV dysfunction a significant component  Subjective:     Patient ID: [name], male   DOB: 22-Oct-1955, 63 y.o.   MRN: [id number]  HPI   OV 04/23/2017  Chief  Complaint  Patient presents with  . Follow-up    Pt states that his breathing is worse than before. C/o SOB all the time, coughing, and chest pain. 6L O2 24/7.   Follow-up ILD chronic hypoxemic respiratory failure. He did have a high-resolution CT scan of the chest in September 2018. The shows mostly pulmonary edema and fluid pattern. I personally not seen in the last year and a half. He says that he continues to live with his mom. [facility] is visiting him 3 times a week and giving supportive care. He is on a lot of opioids and anxiolytics. He easily is nodding off in the exam room. He doesn't seem interested in giving me a good history. He has no insight into his disease. [place] symptom assessment score shows significant worsening of fatigue and pain and depression and overall  high symptom burden.   OV 03/10/2018  Chief Complaint  Patient presents with  . Follow-up    Pt states he is having a lot of pain with his lungs and it is very unbearable. Pt also states his SOB is a lot worse, coughs all the time, has a lot of back pain, is very unsteady on his feet, and also has occ. CP.    [name] , 63 y.o. , with dob May 29, 1956 and male ,Not Hispanic or Latino from [address] [place] [address] - presents to lung clinic for hronic hypoxemic respiratory failure that previously eyes seen was on the basis of ILD but CT scan last in 2018 showed that he did not have ILD. In fact he's had a follow-up CT scan of the chest May 2019 that does not show ILD. But he does have chronic hypoxemic respiratory failure. The basis of this is poorly understood but given his smoking history and 2016 pulmonary function test showing isolated reduction in diffusion capacity this would be all COPD/emphysema. He has missed many appointments and is very poor with history given his opioid dependence. Today he is a little bit more awake and he tells me that he continues to be on 5 L oxygen  nasal cannula. He says that he frequently dozes off and loses balance and falls.He also says for the last few months his lungs have been hurting but is no cough or congestion. He says he is on nebulizers but we do not have this in the med list. He does not know what nebulizers he is on. On exam today was significantly wheezing according to the medical assistant.   OV 08/01/2018  Chief Complaint  Patient presents with  . Follow-up    Pt states his breathing has become worse since last visit, has a dry cough, has had chest pain, and states his lungs have hurt. Pt states he feels like his lungs are burning.      OV 08/01/2018  Subjective:  Patient ID: [name], male , DOB: 1955/08/27 , age 50 y.o. , MRN: [id number] , ADDRESS: [place] [id number]   08/01/2018 -   Chief Complaint  Patient presents with  . Follow-up    Pt states his breathing has become worse since last visit, has a dry cough, has had chest pain, and states his lungs have hurt. Pt states he feels like his lungs are burning.     HPI [name] 63 y.o. -presents for his follow-up of mixed emphysema with DIP/burned-out Langerhans' cell histiocytosis.  Since his last visit he got admitted November 2019 with acute on chronic hypoxemic respiratory failure.  In the interim he continues to smoke.  He is no longer in hospice but is requesting hospice back because he benefited from the extra layer of support of the palliative piece.  He thinks he might be dying but there is no evidence of that.  His primary care physician ordered ultrasound of the liver that is reported as diffuse hepatocellular disease not otherwise specified.  He will follow-up with [place] primary care physician about it.  Today try to requalify for oxygen.  We turned his oxygen off and walked him but even after 1 lap he became fatigued and wobbly and was at fall risk so we could not walk him any further.  His main issue is that he has significant  multiple symptomatology that includes significant fatigue and nonspecific abdominal pain and shortness of breath.  COPD CAT score showed a symptom burden at 33 with fatigue at rated as very severe, sleep quality is very severe limitation in activities is very severe and dyspnea is very severe and chest tightness is very severe.  The only thing he does not have is any sputum production but he has a moderate cough.     [place] Symptom Assessment Numerical Scale 0 is no problem -> 10 worst problem 10/14/2015   04/23/2017  08/01/2018   No Pain -> Worst pain 0 7   No Tiredness -> Worset tiredness 10 9   No Nausea -> Worst nausea 0 0   No Depression -> Worst depression 7 10   No Anxiety -> Worst Anxiety 8 5   No Drowsiness -> Worst Drowsiness 10 3 (though was nodding off)   Best appetite-> Worst Appetitle 3 4   Best Feeling of well being -> Worst feeling 5 7   No dyspnea-> Worst dyspnea 8 8   Other problem (none -> severe) 10 - chest discomfort, when active 7 leg pain   Completed by  patient patient         Results for [name], [name] (MRN [id number]) as of 08/01/2018 11:13  Ref. Range 04/13/2014 11:43 09/07/2014 11:39  FVC-Pre Latest Units: L 4.37 4.03  FVC-%Pred-Pre Latest Units: % 90 83  Results for [name], [name] (MRN [id number]) as of 08/01/2018 11:13  Ref. Range 04/13/2014 11:43 09/07/2014 11:39  DLCO unc Latest Units: ml/min/mmHg 17.92 16.41  DLCO unc % pred Latest Units: % 55 50   ROS - per HPI     has a past medical history of Anxiety, Arthritis, Cholecystitis, acute (12/20/2013), Cocaine abuse ([place]), COPD (chronic obstructive pulmonary disease) ([place]), Coronary atherosclerosis of native coronary artery, Depression, Essential hypertension, benign, GERD (gastroesophageal reflux disease), Headache, Hepatitis (Late 1970s), History of nephrolithiasis, History of pneumonia, History of stroke, Hyperlipidemia, Noncompliance, Renal cell carcinoma ([place]), Sleep apnea, and Type 2 diabetes mellitus  ([place]) (2011).   reports that he has been smoking cigarettes. He started smoking about 46 years ago. He has a 20.50 pack-year smoking history. He has never used smokeless tobacco.  Past Surgical History:  Procedure Laterality Date  . APPENDECTOMY  1970's  . CHOLECYSTECTOMY N/A 12/22/2013   Procedure: LAPAROSCOPIC CHOLECYSTECTOMY ;  Surgeon: [name], MD;  Location: [facility];  Service: General;  Laterality: N/A;  . ENDARTERECTOMY Right 10/08/2014   Procedure: Right ENDARTERECTOMY CAROTID;  Surgeon: [name], MD;  Location: [facility];  Service: Vascular;  Laterality: Right;  . LAPAROSCOPIC LYSIS OF ADHESIONS  02/21/2014   Procedure: LAPAROSCOPIC LYSIS OF ADHESIONS EXTINSIVE;  Surgeon: [name], MD;  Location: [facility];  Service: Urology;;  . LEFT HEART CATHETERIZATION WITH CORONARY ANGIOGRAM N/A 06/02/2012   Procedure: LEFT HEART CATHETERIZATION WITH CORONARY ANGIOGRAM;  Surgeon: [name], MD;  Location: [facility] CATH LAB;  Service: Cardiovascular;  Laterality: N/A;  . LEFT HEART CATHETERIZATION WITH CORONARY ANGIOGRAM N/A 03/09/2013   Procedure: LEFT HEART CATHETERIZATION WITH CORONARY ANGIOGRAM;  Surgeon: [name], MD;  Location: [facility] CATH LAB;  Service: Cardiovascular;  Laterality: N/A;  . LEFT HEART CATHETERIZATION WITH CORONARY ANGIOGRAM N/A 12/20/2013   Procedure: LEFT HEART CATHETERIZATION WITH CORONARY ANGIOGRAM;  Surgeon: [name], MD;  Location: [facility] CATH LAB;  Service: Cardiovascular;  Laterality: N/A;  . LUNG BIOPSY Left 05/18/2014   Procedure: LUNG BIOPSY left upper lobe & left lower lobe;  Surgeon: [name], MD;  Location: [facility];  Service: Thoracic;  Laterality: Left;  . PATCH ANGIOPLASTY Right 10/08/2014   Procedure: PATCH ANGIOPLASTY Right Carotid;  Surgeon: [name], MD;  Location: [facility];  Service: Vascular;  Laterality: Right;  . ROBOT ASSISTED LAPAROSCOPIC NEPHRECTOMY Right 02/21/2014   Procedure: ROBOTIC ASSISTED LAPAROSCOPIC RIGHT  NEPHRECTOMY ;  Surgeon: [name], MD;  Location: [facility];  Service: Urology;  Laterality: Right;  [name] VIDEO ASSISTED THORACOSCOPY Left 05/18/2014   Procedure: LEFT VIDEO ASSISTED THORACOSCOPY;  Surgeon: [name], MD;  Location: [facility];  Service: Thoracic;  Laterality: Left;  [name] VIDEO BRONCHOSCOPY  05/18/2014   Procedure: VIDEO BRONCHOSCOPY;  Surgeon: [name], MD;  Location: [facility];  Service: Thoracic;;    Allergies  Allergen Reactions  . Oxycodone Itching and Nausea Only    Pt is able to tolerate if taken with benadryl    Immunization History  Administered Date(s) Administered  . Influenza Split 04/04/2014, 03/27/2015  . Pneumococcal Polysaccharide-23 11/04/2012    Family History  Problem Relation Age of Onset  . Cancer Mother        Thyroid - living in her 63's.  [name] Hypertension Other   . Diabetes Other   . Stroke Other   . Lung cancer Father   . CAD Father   . Cancer Maternal Grandmother        Breast  . Cancer Maternal Grandfather        Throat and stomach  . CAD Brother      Current Outpatient Medications:  .  ALPRAZolam (XANAX) 0.5 MG tablet, Take 1 tablet (0.5 mg total) by mouth 3 (three) times daily as needed for anxiety., Disp: , Rfl: 0 .  atorvastatin (LIPITOR) 20 MG tablet, Take 20 mg by mouth at bedtime., Disp: , Rfl: 1 .  clopidogrel (PLAVIX) 75 MG tablet, Take 1 tablet (75 mg total) by mouth daily., Disp: 90 tablet, Rfl: 3 .  diltiazem (CARDIZEM CD) 240 MG 24 hr capsule, Take 240 mg by mouth daily., Disp: , Rfl:  .  docusate sodium (COLACE) 100 MG capsule, Take 100 mg by mouth at bedtime., Disp: , Rfl:  .  FLUoxetine (PROZAC) 10 MG capsule, Take 10 mg by mouth every morning. Take with 40 mg capsule = 50 mg total, Disp: , Rfl: 0 .  FLUoxetine (PROZAC) 40 MG capsule, Take 40 mg by mouth daily. Take with 10 mg capsule = 50 mg total, Disp: , Rfl:  .  gabapentin (NEURONTIN) 300 MG capsule, Take 1 capsule (300 mg total) by mouth 3 (three)  times daily., Disp: , Rfl:  .  Glycopyrrolate-Formoterol (BEVESPI AEROSPHERE) 9-4.8 MCG/ACT AERO, Inhale 2 puffs into the lungs 2 (two) times daily., Disp: 1 Inhaler, Rfl: 11 .  isosorbide mononitrate (IMDUR) 60 MG 24 hr tablet, Take 1 tablet (60 mg total) by mouth daily., Disp: 90 tablet, Rfl: 3 .  LANTUS SOLOSTAR 100 UNIT/ML Solostar Pen, Inject 120 Units into the skin 2 (two) times daily. , Disp: , Rfl: 4 .  linagliptin (TRADJENTA) 5 MG TABS tablet, Take 5 mg by mouth daily., Disp: , Rfl:  .  LORazepam (ATIVAN) 1 MG tablet, Take 5 mg by mouth at bedtime., Disp: , Rfl:  .  oxybutynin (DITROPAN-XL) 10 MG 24 hr tablet, Take 10 mg by mouth daily., Disp: , Rfl: 11 .  oxyCODONE-acetaminophen (PERCOCET) 10-325 MG tablet, Take 1 tablet by mouth every 6 (six) hours as needed for pain., Disp: 30 tablet, Rfl: 0 .  pantoprazole (PROTONIX) 40 MG tablet, Take 40  mg by mouth daily., Disp: , Rfl:  .  potassium chloride SA (K-DUR,KLOR-CON) 20 MEQ tablet, Take 20 mEq by mouth 3 (three) times daily., Disp: , Rfl:  .  Probiotic Product (PROBIOTIC PO), Take 1 capsule by mouth daily., Disp: , Rfl:  .  tamsulosin (FLOMAX) 0.4 MG CAPS capsule, Take 0.4 mg by mouth daily. , Disp: , Rfl:  .  torsemide (DEMADEX) 20 MG tablet, TAKE THREE TABLETS BY MOUTH TWICE DAILY, Disp: 620 tablet, Rfl: 3 .  nicotine (NICODERM CQ - DOSED IN MG/24 HOURS) 14 mg/24hr patch, Place 1 patch (14 mg total) onto the skin daily. (Patient not taking: Reported on 08/01/2018), Disp: 28 patch, Rfl: 0 .  nitroGLYCERIN (NITROSTAT) 0.4 MG SL tablet, PLACE ONE TABLET UNDER THE TONGUE EVERY FIVE MINUTES AS NEEDED FOR CHEST PAIN. IF 3 TABLETS ARE NECESSARY CALL 911 (Patient not taking: Reported on 08/01/2018), Disp: 25 tablet, Rfl: 3      Objective:   Vitals:   08/01/18 1106  BP: 126/70  Pulse: 87  SpO2: 97%  Weight: 241 lb 12.8 oz (109.7 kg)  Height: 5\' 9"  (1.753 m)    Estimated body mass index is 35.71 kg/m as calculated from the following:    Height as of this encounter: 5\' 9"  (1.753 m).   Weight as of this encounter: 241 lb 12.8 oz (109.7 kg).  @WEIGHTCHANGE @  [employer]   08/01/18 1106  Weight: 241 lb 12.8 oz (109.7 kg)     Physical Exam  General Appearance:    Alert, cooperative, no distress, appears stated age - looks older , Deconditioned looking - yes , OBESE  - yes, Sitting on Wheelchair -  no  Head:    Normocephalic, without obvious abnormality, atraumatic  Eyes:    PERRL, conjunctiva/corneas clear,  Ears:    Normal TM's and external ear canals, both ears  Nose:   Nares normal, septum midline, mucosa normal, no drainage    or sinus tenderness. OXYGEN ON  - yes . Patient is @ 5L (room air at rest -92%)   Throat:   Lips, mucosa, and tongue normal; teeth and gums normal. Cyanosis on lips - no  Neck:   Supple, symmetrical, trachea midline, no adenopathy;    thyroid:  no enlargement/tenderness/nodules; no carotid   bruit or JVD  Back:     Symmetric, no curvature, ROM normal, no CVA tenderness  Lungs:     Distress - no , Wheeze no, Barrell Chest - no, Purse lip breathing - no, Crackles - no   Chest Wall:    No tenderness or deformity.    Heart:    Regular rate and rhythm, S1 and S2 normal, no rub   or gallop, Murmur - no  Breast Exam:    NOT DONE  Abdomen:     Soft, non-tender, bowel sounds active all four quadrants,    no masses, no organomegaly. Visceral obesity - yes  Genitalia:   NOT DONE  Rectal:   NOT DONE  Extremities:   Extremities - normal, Has Cane - no, Clubbing - YES, Edema - no  Pulses:   2+ and symmetric all extremities  Skin:   Stigmata of Connective Tissue Disease - no  Lymph nodes:   Cervical, supraclavicular, and axillary nodes normal  Psychiatric:  Neurologic:   Pleasant - yes, Anxious - yes, Flat affect - yes, POOR HISTORIAN  CAm-ICU - neg, Alert and Oriented x 3 - yes, Moves all 4s - yes, Speech - normal, Cognition -  intact           Assessment:       ICD-10-CM   1. Chronic  respiratory failure with hypoxia ([name]) J96.11   2. Interstitial pulmonary disease ([place]) J84.9   3. Pulmonary emphysema, unspecified emphysema type ([place]) J43.9   4. Pulmonary emphysema with fibrosis of lung ([place]) J43.9    J84.10        Plan:     Patient Instructions     ICD-10-CM   1. Chronic respiratory failure with hypoxia ([name]) J96.11   2. Interstitial pulmonary disease ([place]) J84.9   3. Pulmonary emphysema, unspecified emphysema type ([place]) J43.9   4. Pulmonary emphysema with fibrosis of lung ([place]) J43.9    J84.10     Unclear if stable or worse Unable to do walk test for o2 qualification because of fatigue and gait imbalance  PLAN - do HRCT suppine and prone (last sept 2018 and may 2019) - next few to several weeks - do spirometry and dlco - next few to several weeks - baesd on results of above and your other medical issues we can discuss  - starting new anti-fibrotic therapy (if you meet eligibility and risk of medicine is acceptable)  - participating in ILD-PRO registry study - will discuss your 2015 biopsy at our case conference  Followup - next few to several weeks in regular or ILD clinic; 30 min slot  - redoqualifiying walk next visit  > 50% of this > 25 min visit spent in face to face counseling or coordination of care - by this undersigned MD - Dr Brand Males. This includes one or more of the following documented above: discussion of test results, diagnostic or treatment recommendations, prognosis, risks and benefits of management options, instructions, education, compliance or risk-factor reduction     SIGNATURE    Dr. Brand Males, M.D., F.C.C.P,  Pulmonary and Critical Care Medicine Staff Physician, [place] Director - Interstitial Lung Disease  Program  Pulmonary [place] at [place], [place], [address]  Pager: [phone], If no answer or between  15:00h - 7:00h: call [phone] Telephone: [phone]  11:35 AM 08/01/2018

## 2018-08-01 NOTE — Telephone Encounter (Signed)
Pt at office today for a f/u OV. Pt brought letter from [facility] stating that he was needing to requalify for his O2.  Qualifying walk was attempted at office today, 08/01/2018 but pt did not requalify for O2. Pt was only able to walk 1 lap due to fatigue and lowest O2 sat was 90% on room air.  With the paperwork that pt had, it stated the qualifying walk needed to be done within 15 days of receiving the paperwork and pt received it last month. Pt stated that he did call [facility] to let them know that he had the upcoming appt today, 08/01/2018 and they stated to him it was fine for him to have the requalification performed at the [facility].  Pt is scheduled for a f/u appt with MR 09/08/2018.   Due to when pt's next appt has been scheduled, with questions if this will be okay for pt to come back to try to be requalified again or if a separate appt will need to be scheduled for pt, stated to pt that I would call [facility] to ask them.  Attempted to call [facility] to speak with Corene Cornea but unable to reach him. Left message for Corene Cornea to return my call so I can see if pt's current f/u appt will be okay for pt to try to requalify again or if a separate sooner appt will need to be done. Will await a return call from [facility].

## 2018-08-03 NOTE — Telephone Encounter (Signed)
Corene Cornea from [facility] returning phone call.  Corene Cornea number is [phone].

## 2018-08-03 NOTE — Telephone Encounter (Signed)
Called spoke with Corene Cornea w/ [facility] who reported that it is okay for patient to be requalified in February but he will definitely need another office visit because patient is coming off of Hospice and will be like a new start.   Called spoke with patient, he is aware requalification can be done at the 2.20.2020 appt with MR Will sign and route to [place] to make her aware

## 2018-08-11 ENCOUNTER — Ambulatory Visit (INDEPENDENT_AMBULATORY_CARE_PROVIDER_SITE_OTHER)
Admission: RE | Admit: 2018-08-11 | Discharge: 2018-08-11 | Disposition: A | Discharge status: Home / Self Care | Payer: Medicaid Other | Source: Ambulatory Visit | Attending: Internal Medicine | Admitting: Internal Medicine

## 2018-08-11 DIAGNOSIS — J849 Interstitial pulmonary disease, unspecified: Secondary | ICD-10-CM | POA: Diagnosis not present

## 2018-08-11 DIAGNOSIS — J9611 Chronic respiratory failure with hypoxia: Secondary | ICD-10-CM | POA: Diagnosis not present

## 2018-08-11 DIAGNOSIS — J8410 Pulmonary fibrosis, unspecified: Secondary | ICD-10-CM | POA: Diagnosis not present

## 2018-08-11 DIAGNOSIS — J439 Emphysema, unspecified: Secondary | ICD-10-CM

## 2018-08-30 ENCOUNTER — Encounter: Payer: Self-pay | Admitting: Internal Medicine

## 2018-08-30 NOTE — Progress Notes (Signed)
Interstitial Lung Disease Multidisciplinary Conference   [name]    MRN [id number]    DOB 1955-12-20  Primary Care [facility], Faylene Million, PA-C  Referring Physician: Chase Caller  Time of Conference: 7.30am- 8.30am Date of conference: 08/30/2018 Location of Conference: -  [place]; typically 2nd Tuesday of each month  Participating Pulmonary: Dr. Brand Males, MD - yes,  Dr Marshell Garfinkel, MD - yes Pathology: Dr Jaquita Folds, MD - yes Radiology: Dr Salvatore Marvel MD - yes, Dr Vinnie Langton MD - no,  Dr Lorin Picket, MD - no Others: x  Brief History: smoker with prior dx of DIP on bix from 2015. Being brought back to discussion in [place] as path was read originally in [place]  Serology: neg  MDD discussion of CT scan   - Date or time period of scan: 2015 - Features mentioned:  -very subtle findings. Could have been mistaken for normal  Pathology discussion of biopsy 10/30/215 - SRIF + emphysema. Our pathologist does not think this is DIP. There is RBILD + fibrosis . No evidence of burnt out Hx. There is subpleural ILD.   Also, 75mm BAC was excised:  PFTs: x  Labs: x*  MDD Impression/Recs: s/p curative excision of BAC. SRIF + Emphysema. Patient needs to quit smoking. If proigresive can consider for ILD PRO study +/- Ofev but not good candidate for ofev   Time Spent in preparation and discussion: 19 min    SIGNATURE    Dr. Brand Males, M.D., F.C.C.P,  Pulmonary and Critical Care Medicine Staff Physician, [place] Director - Interstitial Lung Disease  Program  Pulmonary [facility] [place], [place], [address]  Pager: [phone], If no answer or between  15:00h - 7:00h: call [phone] Telephone: [phone]  6:13  PM 08/30/2018 ...................................................................................................................[name]  References: Diagnosis of Idiopathic Pulmonary Fibrosis. An Official ATS/ERS/JRS/ALAT Clinical Practice Guideline. Raghu G et al, [place]. 2018 Sep 1;198(5):e44-[id number].   IPF Suspected   Histopath ology Pattern      UIP  Probable UIP  Indeterminate for  UIP  Alternative  diagnosis    UIP  IPF  IPF  IPF  Non-IPF dx   HRCT   Probabe UIP  IPF  IPF  IPF (Likely)**  Non-IPF dx  Pattern  Indeterminate for UIP  IPF  IPF (Likely)**  Indeterminate  for IPF**  Non-IPF dx    Alternative diagnosis  IPF (Likely)**/ non-IPF dx  Non-IPF dx  Non-IPF dx  Non-IPF dx     Idiopathic pulmonary fibrosis diagnosis based upon HRCT and Biopsy paterns.  ** IPF is the likely diagnosis when any of following features are present:  . Moderate-to-severe traction bronchiectasis/bronchiolectasis (defined as mild traction bronchiectasis/bronchiolectasis in four or more lobes including the lingual as a lobe, or moderate to severe traction bronchiectasis in two or more lobes) in a man over age 90 years or in a woman over age 7 years . Extensive (>30%) reticulation on HRCT and an age >70 years  . Increased neutrophils and/or absence of lymphocytosis in BAL fluid  . Multidisciplinary discussion reaches a confident diagnosis of IPF.   **Indeterminate for IPF  . Without an adequate biopsy is unlikely to be IPF  . With an adequate biopsy may be reclassified to a more specific diagnosis after multidisciplinary discussion and/or additional consultation.   dx = diagnosis; HRCT = high-resolution computed tomography; IPF =  idiopathic pulmonary fibrosis; UIP = usual interstitial pneumonia.

## 2018-09-06 NOTE — Progress Notes (Signed)
Cardiology Office Note  Date: 09/07/2018   ID: Juanya, Villavicencio May 08, 1956, MRN [id number]  PCP: Camille Bal, PA-C  Primary Cardiologist: [name], MD   Chief Complaint  Patient presents with  . Coronary Artery Disease    History of Present Illness: PENNY FRISBIE is a 63 y.o. male last seen in June 2019.  He presents overdue for follow-up.  I reviewed interval records. He continues to follow with Dr. Chase Caller in the pulmonary clinic, last seen in January for follow-up of chronic hypoxic respiratory in the setting of ILD.  He tells me that he is being reconsidered for placement back in the hospice program.  He remains chronically short of breath, has been using his oxygen more frequently.  He does not report any specific increase in angina symptoms however.  I reviewed his cardiac medications which are stable and outlined below.  He remains on Demadex at 60 mg twice daily along with potassium supplements.  Weight is down relative to January.  Anti-angina regimen includes Cardizem CD and Imdur.  He is also on Plavix and Lipitor.  Past Medical History:  Diagnosis Date  . Anxiety   . Arthritis   . Cholecystitis, acute 12/20/2013   Lap chole 6/5  . Cocaine abuse ([facility])   . COPD (chronic obstructive pulmonary disease) ([place])   . Coronary atherosclerosis of native coronary artery    a. 03/09/2013 Cath/PCI: LM nl, LAD: 50p, 71m (2.5x16 promus DES), LCX nl, OM1 min irregs, LPL/LPDA diff dzs, RCA nondom, mod diff dzs, EF 55%.  . Depression   . Essential hypertension, benign   . GERD (gastroesophageal reflux disease)   . Headache   . Hepatitis Late 1970s  . History of nephrolithiasis   . History of pneumonia   . History of stroke    Right MCA distribution, residual left-sided weakness  . Hyperlipidemia   . Noncompliance   . Renal cell carcinoma [facility])    Status post radical right nephrectomy August 2015  . Sleep apnea    On CPAP, 4L O2 no cpap at home yet  . Type 2  diabetes mellitus ([place]) 2011    Past Surgical History:  Procedure Laterality Date  . APPENDECTOMY  1970's  . CHOLECYSTECTOMY N/A 12/22/2013   Procedure: LAPAROSCOPIC CHOLECYSTECTOMY ;  Surgeon: [name], MD;  Location: [facility];  Service: General;  Laterality: N/A;  . ENDARTERECTOMY Right 10/08/2014   Procedure: Right ENDARTERECTOMY CAROTID;  Surgeon: [name], MD;  Location: [place];  Service: Vascular;  Laterality: Right;  . LAPAROSCOPIC LYSIS OF ADHESIONS  02/21/2014   Procedure: LAPAROSCOPIC LYSIS OF ADHESIONS EXTINSIVE;  Surgeon: [name], MD;  Location: [facility];  Service: Urology;;  . LEFT HEART CATHETERIZATION WITH CORONARY ANGIOGRAM N/A 06/02/2012   Procedure: LEFT HEART CATHETERIZATION WITH CORONARY ANGIOGRAM;  Surgeon: [name], MD;  Location: [facility] CATH LAB;  Service: Cardiovascular;  Laterality: N/A;  . LEFT HEART CATHETERIZATION WITH CORONARY ANGIOGRAM N/A 03/09/2013   Procedure: LEFT HEART CATHETERIZATION WITH CORONARY ANGIOGRAM;  Surgeon: [name], MD;  Location: [facility] CATH LAB;  Service: Cardiovascular;  Laterality: N/A;  . LEFT HEART CATHETERIZATION WITH CORONARY ANGIOGRAM N/A 12/20/2013   Procedure: LEFT HEART CATHETERIZATION WITH CORONARY ANGIOGRAM;  Surgeon: [name], MD;  Location: [facility] CATH LAB;  Service: Cardiovascular;  Laterality: N/A;  . LUNG BIOPSY Left 05/18/2014   Procedure: LUNG BIOPSY left upper lobe & left lower lobe;  Surgeon: [name], MD;  Location: [facility]  OR;  Service: Thoracic;  Laterality: Left;  . PATCH ANGIOPLASTY Right 10/08/2014   Procedure: PATCH ANGIOPLASTY Right Carotid;  Surgeon: [name], MD;  Location: [place];  Service: Vascular;  Laterality: Right;  . ROBOT ASSISTED LAPAROSCOPIC NEPHRECTOMY Right 02/21/2014   Procedure: ROBOTIC ASSISTED LAPAROSCOPIC RIGHT NEPHRECTOMY ;  Surgeon: [name], MD;  Location: [facility];  Service: Urology;  Laterality: Right;  [name] VIDEO ASSISTED THORACOSCOPY Left 05/18/2014    Procedure: LEFT VIDEO ASSISTED THORACOSCOPY;  Surgeon: [name], MD;  Location: [place];  Service: Thoracic;  Laterality: Left;  [name] VIDEO BRONCHOSCOPY  05/18/2014   Procedure: VIDEO BRONCHOSCOPY;  Surgeon: [name], MD;  Location: [place];  Service: Thoracic;;    Current Outpatient Medications  Medication Sig Dispense Refill  . ALPRAZolam (XANAX) 0.5 MG tablet Take 1 tablet (0.5 mg total) by mouth 3 (three) times daily as needed for anxiety.  0  . atorvastatin (LIPITOR) 20 MG tablet Take 20 mg by mouth at bedtime.  1  . clopidogrel (PLAVIX) 75 MG tablet Take 1 tablet (75 mg total) by mouth daily. 90 tablet 3  . diltiazem (CARDIZEM CD) 240 MG 24 hr capsule Take 240 mg by mouth daily.    [name] docusate sodium (COLACE) 100 MG capsule Take 100 mg by mouth at bedtime.    [name] FLUoxetine (PROZAC) 10 MG capsule Take 10 mg by mouth every morning. Take with 40 mg capsule = 50 mg total  0  . FLUoxetine (PROZAC) 40 MG capsule Take 40 mg by mouth daily. Take with 10 mg capsule = 50 mg total    . gabapentin (NEURONTIN) 300 MG capsule Take 1 capsule (300 mg total) by mouth 3 (three) times daily.    . Glycopyrrolate-Formoterol (BEVESPI AEROSPHERE) 9-4.8 MCG/ACT AERO Inhale 2 puffs into the lungs 2 (two) times daily. 1 Inhaler 11  . insulin aspart (NOVOLOG) 100 UNIT/ML injection Inject into the skin 3 (three) times daily before meals.    . isosorbide mononitrate (IMDUR) 60 MG 24 hr tablet Take 1 tablet (60 mg total) by mouth daily. 90 tablet 3  . LANTUS SOLOSTAR 100 UNIT/ML Solostar Pen Inject 120 Units into the skin 2 (two) times daily.   4  . linagliptin (TRADJENTA) 5 MG TABS tablet Take 5 mg by mouth daily.    [name] LORazepam (ATIVAN) 1 MG tablet Take 5 mg by mouth at bedtime.    . nicotine (NICODERM CQ - DOSED IN MG/24 HOURS) 14 mg/24hr patch Place 1 patch (14 mg total) onto the skin daily. 28 patch 0  . nitroGLYCERIN (NITROSTAT) 0.4 MG SL tablet PLACE ONE TABLET UNDER THE TONGUE EVERY FIVE MINUTES  AS NEEDED FOR CHEST PAIN. IF 3 TABLETS ARE NECESSARY CALL 911 25 tablet 3  . oxybutynin (DITROPAN-XL) 10 MG 24 hr tablet Take 10 mg by mouth daily.  11  . oxyCODONE-acetaminophen (PERCOCET) 10-325 MG tablet Take 1 tablet by mouth every 6 (six) hours as needed for pain. 30 tablet 0  . pantoprazole (PROTONIX) 40 MG tablet Take 40 mg by mouth daily.    . potassium chloride SA (K-DUR,KLOR-CON) 20 MEQ tablet Take 20 mEq by mouth 3 (three) times daily.    . Probiotic Product (PROBIOTIC PO) Take 1 capsule by mouth daily.    . tamsulosin (FLOMAX) 0.4 MG CAPS capsule Take 0.4 mg by mouth daily.     [name] torsemide (DEMADEX) 20 MG tablet TAKE THREE TABLETS BY MOUTH TWICE DAILY 620 tablet 3   No  current facility-administered medications for this visit.    Allergies:  Oxycodone   Social History: The patient  reports that he has been smoking cigarettes. He started smoking about 46 years ago. He has a 20.50 pack-year smoking history. He has never used smokeless tobacco. He reports that he does not drink alcohol or use drugs.   ROS:  Please see the history of present illness. Otherwise, complete review of systems is positive for fatigue, chronic dyspnea.  All other systems are reviewed and negative.   Physical Exam: VS:  BP (!) 100/58   Pulse 82   Ht 5\' 10"  (1.778 m)   Wt 234 lb (106.1 kg)   SpO2 97%   BMI 33.58 kg/m , BMI Body mass index is 33.58 kg/m.  Wt Readings from Last 3 Encounters:  09/07/18 234 lb (106.1 kg)  08/01/18 241 lb 12.8 oz (109.7 kg)  06/01/18 253 lb 4.9 oz (114.9 kg)    General: Chronically ill-appearing overweight male, wearing oxygen via nasal cannula, no distress. HEENT: Conjunctiva and lids normal, oropharynx clear. Neck: Supple, no elevated JVP or carotid bruits, no thyromegaly. Lungs: Decreased breath sounds, coarse without wheezing, nonlabored breathing at rest. Cardiac: Regular rate and rhythm, no S3 or significant systolic murmur. Abdomen: Protuberant, nontender, bowel  sounds present. Extremities: Chronic appearing lower leg edema and stasis, distal pulses diminished. Skin: Warm and dry. Musculoskeletal: No kyphosis. Neuropsychiatric: Alert and oriented x3, affect grossly appropriate.  ECG: I personally reviewed the tracing from 06/02/2018 which showed sinus rhythm with PAC, low voltage in the limb leads.  Recent Labwork: 05/30/2018: B Natriuretic Peptide 71.2 05/31/2018: ALT 11; AST 18 06/01/2018: Hemoglobin 11.9; Magnesium 2.1; Platelets 180 06/02/2018: BUN 10; Creatinine, Ser 1.27; Potassium 4.0; Sodium 139   Other Studies Reviewed Today:  Cardiac catheterization 12/20/2013: Hemodynamic Findings: Central aortic pressure: 120/62 Left ventricular pressure: 127/7/15  Angiographic Findings:  Left main: Short segment. No obstructive disease.   Left Anterior Descending Artery: Moderate caliber vessel that courses to the apex. There is 40% proximal stenosis. The mid stented segment is patent without restenosis. The distal vessel has diffuse non-obstructive plaque. The diagonal branches are small in caliber with no obstructive disease.   Circumflex Artery: Large dominant system with large bifurcating first obtuse marginal branch and three small caliber posterolateral branches. The OM branch has diffuse 50% proximal stenosis, no focally obstructive lesions. The three small caliber posterolateral branches all have diffuse moderate to severe stenosis that is unchanged from last cath.   Right Coronary Artery: Small non-dominant vessel with 40% mid vessel stenosis.   Left Ventricular Angiogram: LVEF=65%  Impression: 1. Double vessel CAD with patent stent mid LAD 2. Moderate non-obstructive disease in the Circumflex and RCA 3. Normal LV systolic function  Echocardiogram 07/05/2015: Study Conclusions  - Left ventricle: The cavity size was normal. Wall thickness was normal. Systolic function was normal. The estimated ejection fraction was  in the range of 60% to 65%. Doppler parameters are consistent with abnormal left ventricular relaxation (grade 1 diastolic dysfunction).  Assessment and Plan:  1.  CAD with angina on medical therapy.  Cardiac catheterization in 2015 demonstrated patent LAD stent site and otherwise moderate nonobstructive disease in the circumflex and RCA.  Continue Plavix and statin.  Also on Cardizem CD and Imdur for angina control.  2.  Chronic diastolic heart failure.  Continue Demadex with potassium supplements.  3.  End-stage interstitial lung disease with chronic hypoxic respiratory failure.  He is followed by Dr. Chase Caller.  4.  Poorly controlled type 2 diabetes mellitus, hemoglobin A1c 12.2 in November 2019.  Following with Dr. Meda Coffee and undergoing medication adjustments.  Current medicines were reviewed with the patient today.  Disposition: Follow-up in 6 months.  Signed, Satira Sark, MD, [facility] 09/07/2018 9:47 AM    [place] [place] [facility] [place], [place], [place] [address] Phone: [phone]; Fax: [phone]

## 2018-09-07 ENCOUNTER — Encounter: Payer: Self-pay | Admitting: Cardiology

## 2018-09-07 ENCOUNTER — Ambulatory Visit: Payer: Medicaid Other | Admitting: Cardiology

## 2018-09-07 VITALS — BP 100/58 | HR 82 | Ht 70.0 in | Wt 234.0 lb

## 2018-09-07 DIAGNOSIS — I25119 Atherosclerotic heart disease of native coronary artery with unspecified angina pectoris: Secondary | ICD-10-CM

## 2018-09-07 DIAGNOSIS — I5032 Chronic diastolic (congestive) heart failure: Secondary | ICD-10-CM

## 2018-09-07 DIAGNOSIS — E1165 Type 2 diabetes mellitus with hyperglycemia: Secondary | ICD-10-CM | POA: Diagnosis not present

## 2018-09-07 DIAGNOSIS — J849 Interstitial pulmonary disease, unspecified: Secondary | ICD-10-CM

## 2018-09-07 NOTE — Patient Instructions (Addendum)

## 2018-09-08 ENCOUNTER — Ambulatory Visit: Payer: Medicaid Other | Admitting: Internal Medicine

## 2018-09-08 ENCOUNTER — Encounter: Payer: Self-pay | Admitting: Internal Medicine

## 2018-09-08 ENCOUNTER — Ambulatory Visit (INDEPENDENT_AMBULATORY_CARE_PROVIDER_SITE_OTHER): Payer: Medicaid Other | Admitting: Internal Medicine

## 2018-09-08 VITALS — BP 146/62 | HR 87 | Ht 70.0 in | Wt 236.0 lb

## 2018-09-08 DIAGNOSIS — J849 Interstitial pulmonary disease, unspecified: Secondary | ICD-10-CM

## 2018-09-08 DIAGNOSIS — J9611 Chronic respiratory failure with hypoxia: Secondary | ICD-10-CM

## 2018-09-08 DIAGNOSIS — J8410 Pulmonary fibrosis, unspecified: Secondary | ICD-10-CM | POA: Diagnosis not present

## 2018-09-08 DIAGNOSIS — J439 Emphysema, unspecified: Secondary | ICD-10-CM

## 2018-09-08 DIAGNOSIS — Z87891 Personal history of nicotine dependence: Secondary | ICD-10-CM

## 2018-09-08 LAB — PULMONARY FUNCTION TEST
DL/VA % pred: 62 %
DL/VA: 2.61 ml/min/mmHg/L
DLCO unc % pred: 51 %
DLCO unc: 13.84 ml/min/mmHg
FEF 25-75 PRE: 2.37 L/s
FEF2575-%Pred-Pre: 84 %
FEV1-%Pred-Pre: 82 %
FEV1-Pre: 2.88 L
FEV1FVC-%Pred-Pre: 101 %
FEV6-%Pred-Pre: 84 %
FEV6-Pre: 3.77 L
FEV6FVC-%Pred-Pre: 104 %
FVC-%Pred-Pre: 81 %
FVC-Pre: 3.8 L
Pre FEV1/FVC ratio: 76 %
Pre FEV6/FVC Ratio: 99 %

## 2018-09-08 NOTE — Telephone Encounter (Signed)
Pt came in for OV today, 09/08/2018 and qualifying walk was performed to see if pt qualified for O2 with exertion. After walk, pt's lowest O2 sat was 92% on room air so pt did not qualify for O2. Nothing further needed.

## 2018-09-08 NOTE — Progress Notes (Signed)
OV 04/10/2015  Chief Complaint  Patient presents with  . Follow-up    Pt states his breathing has worsened since last OV. Pt was admitted to [facility] for CP and syncope. Pt c/o increase in DOE, prod cough with dark brown mucus, chest disfomfort when active.    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 At that point in time spirometry showed a significant decline in forced vital capacity from 4 L at baseline to 2.6 L. He was using 4 L of oxygen at rest and 6 L with exertion at home. However we could not convincingly document hypoxemia with exertion on room air in the office at that time. Therefore to this and I ordered pulmonary function tests and CT scan of the chest. He did have CT scan of the chest in August 2016. There is no report on progression compared to February 2016 but I suspect the findings are similar. While he underwent pulmonary function test he had cough syncope and was admitted to the hospital. Cardiology notes reviewed and the final diagnosis was cough syncope. He he now presents for follow-up. He continues to be miserable with class IV dyspnea. He uses 4-6 liters of oxygen because he subjectively feels the need to do that. He does not monitor his pulse ox at home. He says that he continues to have cough syncope at home and has fallen several times. He has now moved to live with his 57 year old mom. He is socially isolated otherwise. He feels like he needs an extra layer of help at home. There are no other issues  Pulse oximetry on room air at rest and walking desaturation test 185 feet 3 laps on room air: RA rest after 15 min off o2, pulse o98% and lowest pulse ox with forehead probe was 90%. He did get dizzy with coughing   OV 06/05/15 63 year old man with tobacco abuse and history of interstitial lung disease,  desquamative interstitial pneumonitis and burnt out Langerhans' cell histiocytosis, hypoxemic respiratory failure; untreated OSA unable to tolerate CPAP. He probably also has some degree of superimposed COPD.  He presents today with two weeks of increased dyspnea, . He is having paroxysms of cough, can lead to pre-syncope / syncope. Last was 2 days ago. He has LE edema, probable more than his baseline. He is using duoneb bid, symbicort bid, albuterol prn - averages once a day. He complains of dry mouth. He took extra lasix after last OV here with TP, helped his edema but not his dyspnea.     OV 07/05/2015  Chief Complaint  Patient presents with  . Follow-up    Pt states his SOB has worsened since last OV. Pt also c/o increase in nonprod cough, pt states he frequently coughs when eating or drinking. Pt c/o right lateral chest pain when coughing. Pt denies f/c/s.   Admit    63yo male smoker with  ILD(DIP)  on Oxygen .  Polysubstance abuse hx.   08/09/2015 [facility] follow up  Patient returns for a post hospital follow-up. Patient was recently admitted with acute on chronic respiratory failure with underlying interstitial lung disease for  acute on chronic diastolic heart failure. He improved with diuresis .  He says he is feeling improved. Has decreased shortness of breath. Unfortunately continues to smoke. We discussed smoking cessation. He denies any hemoptysis, chest pain, orthopnea,  PND, or increased leg swelling.    OV 10/14/2015  Chief Complaint  Patient presents with  . Follow-up    Still having SOB, coughing and mild wheezing. Pt states that he feels like fluid is coming back especially on left side.    Follow-up interstitial lung disease and chronic hypoxemic respiratory failure associated with volume overload and diastolic dysfunction and coronary artery disease and multiple medical problems including depression and anxiety and poor social situation  He presents for routine  follow-up. During stable health times of November been able to document hypoxemia and him on room air at rest. Also during admissions he has been hypoxemic. Again today on room air 10 minutes his pulse ox was 97%. He insists that he gets very dyspneic when he turns his oxygen off and he needs 5 L of oxygen at all times. He does not check his pulse ox at home so we do not know his true oxygen levels when he is off oxygen. He has class III-for dyspnea. He is on oxygen. He lives with his mom was 73 years old and takes care of him. Overall he feels stable. [place] symptom assessment score documented below and it is obvious that is significant problems are fatigue, depression, anxiety and shortness of breath. He is open to undergo home palliative care program  [facility] Symptom Assessment Numerical Scale 0 is no problem -> 10 worst problem 10/14/2015    No Pain -> Worst pain 0  No Tiredness -> Worset tiredness 10  No Nausea -> Worst nausea 0  No Depression -> Worst depression 7  No Anxiety -> Worst Anxiety 8  No Drowsiness -> Worst Drowsiness 10  Best appetite-> Worst Appetitle 3  Best Feeling of well being -> Worst feeling 5  No dyspnea-> Worst dyspnea 8  Other problem (none -> severe) 10 - chest discomfort, when active  Completed by  patient         Last CT chest December 2016 and when compared to August 2016 no change in DIC pattern of ILD  Chest x-ray 10/04/2015: Reported as chronic changes or jaw baseline. I do not have these image for visualization  Lab work 07/26/2015: Shows a creatinine of 1.68 mg percent with a GFR of 43. Hemoglobin 13 g percent.  Cardiology visit with Dr. Domenic Polite 08/28/2015: There is discussion about switching his Lasix and metolazone and Demadex. Volume overload is likely multifactorial with RV dysfunction a significant component  Subjective:     Patient ID: [name], male   DOB: [birth year], 63 y.o.   MRN: [id number]  HPI   OV 04/23/2017  Chief  Complaint  Patient presents with  . Follow-up    Pt states that his breathing is worse than before. C/o SOB all the time, coughing, and chest pain. 6L O2 24/7.   Follow-up ILD chronic hypoxemic respiratory failure. He did have a high-resolution CT scan of the chest in September 2018. The shows mostly pulmonary edema and fluid pattern. I personally not seen in the last year and a half. He says that he continues to live with his mom. [facility] is visiting him 3 times a week and giving supportive care. He is on a lot of opioids and anxiolytics. He easily is nodding off in the exam room. He doesn't seem interested in giving me a good history. He has no insight into his disease. [place] symptom assessment score shows significant worsening of fatigue and pain and depression and overall high  symptom burden.   OV 03/10/2018  Chief Complaint  Patient presents with  . Follow-up    Pt states he is having a lot of pain with his lungs and it is very unbearable. Pt also states his SOB is a lot worse, coughs all the time, has a lot of back pain, is very unsteady on his feet, and also has occ. CP.    [name] , 63 y.o. , with dob 15-Jan-1956 and male ,Not Hispanic or Latino from [address] [place] [address] - presents to lung clinic for hronic hypoxemic respiratory failure that previously eyes seen was on the basis of ILD but CT scan last in 2018 showed that he did not have ILD. In fact he's had a follow-up CT scan of the chest May 2019 that does not show ILD. But he does have chronic hypoxemic respiratory failure. The basis of this is poorly understood but given his smoking history and 2016 pulmonary function test showing isolated reduction in diffusion capacity this would be all COPD/emphysema. He has missed many appointments and is very poor with history given his opioid dependence. Today he is a little bit more awake and he tells me that he continues to be on 5 L oxygen  nasal cannula. He says that he frequently dozes off and loses balance and falls.He also says for the last few months his lungs have been hurting but is no cough or congestion. He says he is on nebulizers but we do not have this in the med list. He does not know what nebulizers he is on. On exam today was significantly wheezing according to the medical assistant.   OV 08/01/2018  Chief Complaint  Patient presents with  . Follow-up    Pt states his breathing has become worse since last visit, has a dry cough, has had chest pain, and states his lungs have hurt. Pt states he feels like his lungs are burning.      OV 08/01/2018  Subjective:  Patient ID: [name], male , DOB: August 06, 1955 , age 85 y.o. , MRN: [id number] , ADDRESS: [place] [id number]   08/01/2018 -   Chief Complaint  Patient presents with  . Follow-up    Pt states his breathing has become worse since last visit, has a dry cough, has had chest pain, and states his lungs have hurt. Pt states he feels like his lungs are burning.     HPI [name] 63 y.o. -presents for his follow-up of mixed emphysema with DIP/burned-out Langerhans' cell histiocytosis.  Since his last visit he got admitted November 2019 with acute on chronic hypoxemic respiratory failure.  In the interim he continues to smoke.  He is no longer in hospice but is requesting hospice back because he benefited from the extra layer of support of the palliative piece.  He thinks he might be dying but there is no evidence of that.  His primary care physician ordered ultrasound of the liver that is reported as diffuse hepatocellular disease not otherwise specified.  He will follow-up with [place] primary care physician about it.  Today try to requalify for oxygen.  We turned his oxygen off and walked him but even after 1 lap he became fatigued and wobbly and was at fall risk so we could not walk him any further.  His main issue is that he has significant  multiple symptomatology that includes significant fatigue and nonspecific abdominal pain and shortness of breath.  COPD CAT score showed a symptom burden at 33 with fatigue at rated as very severe, sleep quality is very severe limitation in activities is very severe and dyspnea is very severe and chest tightness is very severe.  The only thing he does not have is any sputum production but he has a moderate cough.       OV 09/08/2018  Subjective:  Patient ID: [name], male , DOB: 08-29-1955 , age 78 y.o. , MRN: [id number] , ADDRESS: [place] [id number]   09/08/2018 -   Chief Complaint  Patient presents with  . Follow-up    PFT performed today. Pt states he has been worse since last visit. States he had the flu 1 week ago. Pt states his breathing is worse and has more fatigue than usual.   Interstitial lung disease with emphysema -surgical lung biopsy May 18, 2014 read in [place] as DIP with burned-out Langerhans' cell histiocytosis but reinterpreted February 2020 at [facility] as [facility] = smoking-related interstitial fibrosis associated with emphysema  Polypharmacy pain medications and heavy symptom burden  HPI [name] 63 y.o. -returns for follow-up.  He was seen approximately 1 month ago.  At that point in time we decided to re-stage and re-figure his interstitial lung disease.  I discussed his 38 pathology slide at our interstitial lung disease conference.  Our local pulmonary pathologist feels strongly that there is no evidence of Langerhan histiocytosis X.  Instead he has smoking-related interstitial fibrosis (SRIF) with associated emphysema.  On his latest high-resolution CT chest there is not much of a description of ILD even though he has crackles on exam.  Report as below and I personally visualized that CT.  He has a steady progression and worsening of pulmonary function test over 3 or 4 years as documented below.  However, he is not  hypoxemic.  He is continuing to use oxygen and is requesting oxygen for symptomatic relief of dyspnea.  However when he turned the oxygen off and walked him he is not hypoxemic at all.  His room air at rest pulse ox is 97% with heart rate of 88/min.  We walked him 2 laps and he stopped and his pulse ox dropped to 92% and his heart rate rose to 94%.  While he does drop his pulse ox by more than 3 points he is not going below 88%.  On the other hand he is becoming very symptomatic with severe shortness of breath dizziness and staggering.  Medical assistant notes that he is always nodding off.  We administered a [place] symptom assessment score and he has significant poly-symptom burden.  Medication review shows polypharmacy.  He is unable to quit any of these.     [place] Symptom Assessment Numerical Scale 0 is no problem -> 10 worst problem 10/14/2015   04/23/2017  08/01/2018   No Pain -> Worst pain 0 7 9  No Tiredness -> Worset tiredness 10 9 10   No Nausea -> Worst nausea 0 0 0  No Depression -> Worst depression 7 10 5   No Anxiety -> Worst Anxiety 8 5 5   No Drowsiness -> Worst Drowsiness 10 3 (though was nodding off) 1  Best appetite-> Worst Appetitle 3 4 3   Best Feeling of well being -> Worst feeling 5 7 1   Cough   8  No dyspnea-> Worst dyspnea 8 8 10   Other problem (none -> severe) 10 - chest discomfort, when active 7 leg pain 10  Completed by  patient patient patient         Results for [name], [name] (MRN [id number]) as of 09/08/2018 11:46  Ref. Range 04/13/2014 11:43 09/07/2014 11:39 09/08/2018 10:21  FVC-Pre Latest Units: L 4.37 4.03 3.80  FVC-%Pred-Pre Latest Units: % 90 83 81   Results for [name], [name] (MRN [id number]) as of 09/08/2018 11:46  Ref. Range 04/13/2014 11:43 09/07/2014 11:39 09/08/2018 10:21  DLCO unc Latest Units: ml/min/mmHg 17.92 16.41 13.84  DLCO unc % pred Latest Units: % 55 50 51    CT chest high resolution 08/11/2018 Lungs/Pleura: Postoperative changes of wedge  resection are again noted in the left lower lobe and tracking along the left major fissure, similar to the prior study. However, compared to the prior examination there is greatly increased pleuroparenchymal thickening and architectural distortion in the mid to lower left hemithorax with extensive parenchymal banding throughout this region. These findings are markedly asymmetric compared to the contralateral side where there is only minimal linear scarring in the posterior aspect of the right lower lobe in the most dependent portion of the lung. High-resolution images otherwise demonstrate no widespread areas of ground-glass attenuation, septal thickening, traction bronchiectasis or honeycombing elsewhere in the lungs. Inspiratory and expiratory imaging demonstrates mild air trapping, most evident in the left lower lobe. No acute consolidative airspace disease. No pleural effusions. Diffuse bronchial wall thickening with mild centrilobular and paraseptal emphysema.  IMPRESSION: 1. Although there are areas of increasing fibrosis throughout the mid to lower lung, these findings are clearly asymmetric with the contralateral side and are favored to be related to developing areas of pleuroparenchymal scarring, potentially related to prior surgery. The pattern is not a usual pattern for any typical interstitial lung disease, and there is no involvement of the right lung. 2. Diffuse bronchial wall thickening with mild centrilobular and paraseptal emphysema; imaging findings compatible with the reported clinical history of underlying COPD. 3. Air trapping, most evident in the left lower lobe, indicative of small airways disease. 4. Aortic atherosclerosis, in addition to left main and 3 vessel coronary artery disease. Please note that although the presence of coronary artery calcium documents the presence of coronary artery disease, the severity of this disease and any potential  stenosis cannot be assessed on this non-gated CT examination. Assessment for potential risk factor modification, dietary therapy or pharmacologic therapy may be warranted, if clinically indicated. 5. Severe hepatic steatosis. 6. Lipomatous hypertrophy of the interatrial septum.  Aortic Atherosclerosis (ICD10-I70.0) and Emphysema (ICD10-J43.9).   Electronically Signed   By: Vinnie Langton M.D.   On: 08/12/2018 08:29   ROS - per HPI     has a past medical history of Anxiety, Arthritis, Cholecystitis, acute (12/20/2013), Cocaine abuse ([place]), COPD (chronic obstructive pulmonary disease) ([place]), Coronary atherosclerosis of native coronary artery, Depression, Essential hypertension, benign, GERD (gastroesophageal reflux disease), Headache, Hepatitis (Late 1970s), History of nephrolithiasis, History of pneumonia, History of stroke, Hyperlipidemia, Noncompliance, Renal cell carcinoma ([place]), Sleep apnea, and Type 2 diabetes mellitus ([place]) (2011).   reports that he has been smoking cigarettes. He started smoking about 46 years ago. He has a 41.00 pack-year smoking history. He has never used smokeless tobacco.  Past Surgical History:  Procedure Laterality Date  . APPENDECTOMY  1970's  . CHOLECYSTECTOMY N/A 12/22/2013   Procedure: LAPAROSCOPIC CHOLECYSTECTOMY ;  Surgeon: [name], MD;  Location: [facility];  Service: General;  Laterality: N/A;  . ENDARTERECTOMY Right 10/08/2014   Procedure: Right ENDARTERECTOMY CAROTID;  Surgeon: Arvilla Meres  Early, MD;  Location: [facility];  Service: Vascular;  Laterality: Right;  . LAPAROSCOPIC LYSIS OF ADHESIONS  02/21/2014   Procedure: LAPAROSCOPIC LYSIS OF ADHESIONS EXTINSIVE;  Surgeon: [name], MD;  Location: [facility];  Service: Urology;;  . LEFT HEART CATHETERIZATION WITH CORONARY ANGIOGRAM N/A 06/02/2012   Procedure: LEFT HEART CATHETERIZATION WITH CORONARY ANGIOGRAM;  Surgeon: [name], MD;  Location: [facility] CATH LAB;  Service: Cardiovascular;   Laterality: N/A;  . LEFT HEART CATHETERIZATION WITH CORONARY ANGIOGRAM N/A 03/09/2013   Procedure: LEFT HEART CATHETERIZATION WITH CORONARY ANGIOGRAM;  Surgeon: [name], MD;  Location[facility] CATH LAB;  Service: Cardiovascular;  Laterality: N/A;  . LEFT HEART CATHETERIZATION WITH CORONARY ANGIOGRAM N/A 12/20/2013   Procedure: LEFT HEART CATHETERIZATION WITH CORONARY ANGIOGRAM;  Surgeon: [name], MD;  Location: [facility] CATH LAB;  Service: Cardiovascular;  Laterality: N/A;  . LUNG BIOPSY Left 05/18/2014   Procedure: LUNG BIOPSY left upper lobe & left lower lobe;  Surgeon: [name], MD;  Location: [facility];  Service: Thoracic;  Laterality: Left;  . PATCH ANGIOPLASTY Right 10/08/2014   Procedure: PATCH ANGIOPLASTY Right Carotid;  Surgeon: [name], MD;  Location: [facility];  Service: Vascular;  Laterality: Right;  . ROBOT ASSISTED LAPAROSCOPIC NEPHRECTOMY Right 02/21/2014   Procedure: ROBOTIC ASSISTED LAPAROSCOPIC RIGHT NEPHRECTOMY ;  Surgeon: [name], MD;  Location: [facility];  Service: Urology;  Laterality: Right;  [name] VIDEO ASSISTED THORACOSCOPY Left 05/18/2014   Procedure: LEFT VIDEO ASSISTED THORACOSCOPY;  Surgeon: [name], MD;  Location: [place];  Service: Thoracic;  Laterality: Left;  [name] VIDEO BRONCHOSCOPY  05/18/2014   Procedure: VIDEO BRONCHOSCOPY;  Surgeon: [name], MD;  Location: [facility];  Service: Thoracic;;    Allergies  Allergen Reactions  . Oxycodone Itching and Nausea Only    Pt is able to tolerate if taken with benadryl    Immunization History  Administered Date(s) Administered  . Influenza Split 04/04/2014, 03/27/2015  . Pneumococcal Polysaccharide-23 11/04/2012    Family History  Problem Relation Age of Onset  . Cancer Mother        Thyroid - living in her 34's.  [name] Hypertension Other   . Diabetes Other   . Stroke Other   . Lung cancer Father   . CAD Father   . Cancer Maternal Grandmother        Breast  . Cancer Maternal  Grandfather        Throat and stomach  . CAD Brother      Current Outpatient Medications:  .  ALPRAZolam (XANAX) 0.5 MG tablet, Take 1 tablet (0.5 mg total) by mouth 3 (three) times daily as needed for anxiety., Disp: , Rfl: 0 .  atorvastatin (LIPITOR) 20 MG tablet, Take 20 mg by mouth at bedtime., Disp: , Rfl: 1 .  clopidogrel (PLAVIX) 75 MG tablet, Take 1 tablet (75 mg total) by mouth daily., Disp: 90 tablet, Rfl: 3 .  diltiazem (CARDIZEM CD) 240 MG 24 hr capsule, Take 240 mg by mouth daily., Disp: , Rfl:  .  docusate sodium (COLACE) 100 MG capsule, Take 100 mg by mouth at bedtime., Disp: , Rfl:  .  FLUoxetine (PROZAC) 10 MG capsule, Take 10 mg by mouth every morning. Take with 40 mg capsule = 50 mg total, Disp: , Rfl: 0 .  FLUoxetine (PROZAC) 40 MG capsule, Take 40 mg by mouth daily. Take with 10 mg capsule = 50 mg total, Disp: , Rfl:  .  gabapentin (  NEURONTIN) 300 MG capsule, Take 1 capsule (300 mg total) by mouth 3 (three) times daily., Disp: , Rfl:  .  Glycopyrrolate-Formoterol (BEVESPI AEROSPHERE) 9-4.8 MCG/ACT AERO, Inhale 2 puffs into the lungs 2 (two) times daily., Disp: 1 Inhaler, Rfl: 11 .  insulin aspart (NOVOLOG) 100 UNIT/ML injection, Inject into the skin 3 (three) times daily before meals., Disp: , Rfl:  .  isosorbide mononitrate (IMDUR) 60 MG 24 hr tablet, Take 1 tablet (60 mg total) by mouth daily., Disp: 90 tablet, Rfl: 3 .  LANTUS SOLOSTAR 100 UNIT/ML Solostar Pen, Inject 120 Units into the skin 2 (two) times daily. , Disp: , Rfl: 4 .  linagliptin (TRADJENTA) 5 MG TABS tablet, Take 5 mg by mouth daily., Disp: , Rfl:  .  LORazepam (ATIVAN) 1 MG tablet, Take 5 mg by mouth at bedtime., Disp: , Rfl:  .  nicotine (NICODERM CQ - DOSED IN MG/24 HOURS) 14 mg/24hr patch, Place 1 patch (14 mg total) onto the skin daily., Disp: 28 patch, Rfl: 0 .  oxybutynin (DITROPAN-XL) 10 MG 24 hr tablet, Take 10 mg by mouth daily., Disp: , Rfl: 11 .  oxyCODONE-acetaminophen (PERCOCET) 10-325 MG  tablet, Take 1 tablet by mouth every 6 (six) hours as needed for pain., Disp: 30 tablet, Rfl: 0 .  pantoprazole (PROTONIX) 40 MG tablet, Take 40 mg by mouth daily., Disp: , Rfl:  .  potassium chloride SA (K-DUR,KLOR-[name]) 20 MEQ tablet, Take 20 mEq by mouth 3 (three) times daily., Disp: , Rfl:  .  Probiotic Product (PROBIOTIC PO), Take 1 capsule by mouth daily., Disp: , Rfl:  .  tamsulosin (FLOMAX) 0.4 MG CAPS capsule, Take 0.4 mg by mouth daily. , Disp: , Rfl:  .  torsemide (DEMADEX) 20 MG tablet, TAKE THREE TABLETS BY MOUTH TWICE DAILY, Disp: 620 tablet, Rfl: 3 .  nitroGLYCERIN (NITROSTAT) 0.4 MG SL tablet, PLACE ONE TABLET UNDER THE TONGUE EVERY FIVE MINUTES AS NEEDED FOR CHEST PAIN. IF 3 TABLETS ARE NECESSARY CALL 911 (Patient not taking: Reported on 09/08/2018), Disp: 25 tablet, Rfl: 3      Objective:   Vitals:   09/08/18 1058  BP: (!) 146/62  Pulse: 87  SpO2: 97%  Weight: 236 lb (107 kg)  Height: 5\' 10"  (1.778 m)    Estimated body mass index is 33.86 kg/m as calculated from the following:   Height as of this encounter: 5\' 10"  (1.778 m).   Weight as of this encounter: 236 lb (107 kg).  @WEIGHTCHANGE @  [employer]   09/08/18 1058  Weight: 236 lb (107 kg)     Physical Exam  General Appearance:    Alert, cooperative, no distress, appears stated age - older , Deconditioned looking - ye , OBESE  - yes, Sitting on Wheelchair -  no  Head:    Normocephalic, without obvious abnormality, atraumatic  Eyes:    PERRL, conjunctiva/corneas clear,  Ears:    Normal TM's and external ear canals, both ears  Nose:   Nares normal, septum midline, mucosa normal, no drainage    or sinus tenderness. OXYGEN ON  - yes . Patient is @ 2L [place]   Throat:   Lips, mucosa, and tongue normal; teeth and gums normal. Cyanosis on lips - no  Neck:   Supple, symmetrical, trachea midline, no adenopathy;    thyroid:  no enlargement/tenderness/nodules; no carotid   bruit or JVD  Back:     Symmetric, no  curvature, ROM normal, no CVA tenderness  Lungs:  Distress - no , Wheeze no, Barrell Chest - no, Purse lip breathing - no, Crackles - YEs at bsae   Chest Wall:    No tenderness or deformity.    Heart:    Regular rate and rhythm, S1 and S2 normal, no rub   or gallop, Murmur - no  Breast Exam:    NOT DONE  Abdomen:     Soft, non-tender, bowel sounds active all four quadrants,    no masses, no organomegaly. Visceral obesity - yes  Genitalia:   NOT DONE  Rectal:   NOT DONE  Extremities:   Extremities - normal, Has Cane - yes, Clubbing - no, Edema - no  Pulses:   2+ and symmetric all extremities  Skin:   Stigmata of Connective Tissue Disease - no  Lymph nodes:   Cervical, supraclavicular, and axillary nodes normal  Psychiatric:  Neurologic:   Pleasant - yes, Anxious - yes, Flat affect - yes  CAm-ICU - neg, Alert and Oriented x 3 - yes, Moves all 4s - yes, Speech - normal, Cognition - intact. DOZING OFF            Assessment:       ICD-10-CM   1. Chronic respiratory failure with hypoxia ([name]) J96.11   2. Pulmonary emphysema with fibrosis of lung ([place]) J43.9    J84.10   3. Pulmonary emphysema, unspecified emphysema type ([place]) J43.9   4. History of cigarette smoking Z87.891        Plan:     Patient Instructions      ICD-10-CM   1. Chronic respiratory failure with hypoxia ([name]) J96.11   2. Pulmonary emphysema with fibrosis of lung ([place]) J43.9    J84.10   3. Pulmonary emphysema, unspecified emphysema type ([place]) J43.9   4. History of cigarette smoking Z87.891     Your combination of emphysema and smokers related lung fibrosis  Disease might be slightly progressive over many years but not to the point that you will need oxygen at rest or with walking  Continued smoking can make things worse  Your current oxygen use is purely for palliative reasons  You have significant global symptom burden from multiple problems and also multiple medication use  PLAN -Given heavy  symptom burden and multiple medications that you take; do not recommend any new medication for pulmonary fibrosis -At this point continue oxygen even though you really do not need it (you are continuing it for symptom relief] -Continue your regular inhalers/nebulizer as before   Follow-up -Spirometry/DLCO in 6 months -Return to regular clinic in 6 months or sooner if needed    > 50% of this > 25 min visit spent in face to face counseling or coordination of care - by this undersigned MD - Dr Brand Males. This includes one or more of the following documented above: discussion of test results, diagnostic or treatment recommendations, prognosis, risks and benefits of management options, instructions, education, compliance or risk-factor reduction   SIGNATURE    Dr. Brand Males, M.D., F.C.C.P,  Pulmonary and Critical Care Medicine Staff Physician, [facility] Director - Interstitial Lung Disease  Program  [facility] [place], [place], [address]  Pager: [phone], If no answer or between  15:00h - 7:00h: call [phone] Telephone: [phone]  12:02 PM 09/08/2018

## 2018-09-08 NOTE — Patient Instructions (Addendum)
    ICD-10-CM   1. Chronic respiratory failure with hypoxia ([name]) J96.11   2. Pulmonary emphysema with fibrosis of lung ([place]) J43.9    J84.10   3. Pulmonary emphysema, unspecified emphysema type ([place]) J43.9   4. History of cigarette smoking Z87.891     Your combination of emphysema and smokers related lung fibrosis  Disease might be slightly progressive over many years but not to the point that you will need oxygen at rest or with walking  Continued smoking can make things worse  Your current oxygen use is purely for palliative reasons  You have significant global symptom burden from multiple problems and also multiple medication use  PLAN -Given heavy symptom burden and multiple medications that you take; do not recommend any new medication for pulmonary fibrosis -At this point continue oxygen even though you really do not need it (you are continuing it for symptom relief] -Continue your regular inhalers/nebulizer as before   Follow-up -Spirometry/DLCO in 6 months -Return to regular clinic in 6 months or sooner if needed

## 2018-09-08 NOTE — Progress Notes (Signed)
Spirometry and Dlco done today. 

## 2018-09-08 NOTE — Addendum Note (Signed)
Addended by: Lorretta Harp on: 09/08/2018 12:05 PM   Modules accepted: Orders

## 2018-09-27 ENCOUNTER — Telehealth: Payer: Self-pay | Admitting: Internal Medicine

## 2018-09-27 NOTE — Telephone Encounter (Signed)
Yes please order ONO on room air 

## 2018-09-27 NOTE — Telephone Encounter (Signed)
Staff message received which is shown below:  [place]  Calvary Difranco, Waldemar Dickens, CMA        Carrie from [facility] has called asking for ONO to be done on this patient since he didn't qualify for 02 on 09/08/18 when he was seen in the office. His insurance will except ONO results if he qualifies for 02 at night. Do you think we can order ONO if not we will have to pick up his 02   Thanks,  Rodena Piety    MR, please advise if you would be fine for [place] to order ONO on room air for pt to have done to see if he will still qualify for O2 based on ONO? Thanks!

## 2018-09-27 NOTE — Telephone Encounter (Signed)
Called patient, unable to reach [facility]. Left ONO pended until hearing back from patient.

## 2018-10-10 NOTE — Telephone Encounter (Signed)
Called and spoke with patients wife, she stated patient was out of the house and will have him call us back. Will await phone call back.

## 2018-10-18 ENCOUNTER — Other Ambulatory Visit: Payer: Self-pay | Admitting: Cardiology

## 2018-10-27 NOTE — Telephone Encounter (Signed)
Attempted to call pt but unable to reach. Left message for pt to return call. 

## 2018-11-03 NOTE — Telephone Encounter (Signed)
Attempted to call pt but unable to reach. Due to multiple attempts trying to call pt with not having any success, message will be closed.

## 2018-11-07 ENCOUNTER — Observation Stay (HOSPITAL_BASED_OUTPATIENT_CLINIC_OR_DEPARTMENT_OTHER)
Admission: EM | Admit: 2018-11-07 | Discharge: 2018-11-09 | Disposition: A | Discharge status: Home / Self Care | Payer: Medicaid Other | Attending: Family Medicine | Admitting: Family Medicine

## 2018-11-07 ENCOUNTER — Encounter (HOSPITAL_BASED_OUTPATIENT_CLINIC_OR_DEPARTMENT_OTHER): Payer: Self-pay | Admitting: *Deleted

## 2018-11-07 ENCOUNTER — Emergency Department (HOSPITAL_BASED_OUTPATIENT_CLINIC_OR_DEPARTMENT_OTHER): Payer: Medicaid Other

## 2018-11-07 ENCOUNTER — Other Ambulatory Visit: Payer: Self-pay

## 2018-11-07 DIAGNOSIS — J449 Chronic obstructive pulmonary disease, unspecified: Secondary | ICD-10-CM | POA: Diagnosis not present

## 2018-11-07 DIAGNOSIS — Z79899 Other long term (current) drug therapy: Secondary | ICD-10-CM | POA: Insufficient documentation

## 2018-11-07 DIAGNOSIS — R3915 Urgency of urination: Secondary | ICD-10-CM | POA: Insufficient documentation

## 2018-11-07 DIAGNOSIS — Z66 Do not resuscitate: Secondary | ICD-10-CM | POA: Diagnosis not present

## 2018-11-07 DIAGNOSIS — K760 Fatty (change of) liver, not elsewhere classified: Secondary | ICD-10-CM | POA: Insufficient documentation

## 2018-11-07 DIAGNOSIS — R0789 Other chest pain: Secondary | ICD-10-CM | POA: Insufficient documentation

## 2018-11-07 DIAGNOSIS — R05 Cough: Secondary | ICD-10-CM | POA: Insufficient documentation

## 2018-11-07 DIAGNOSIS — E1165 Type 2 diabetes mellitus with hyperglycemia: Secondary | ICD-10-CM | POA: Insufficient documentation

## 2018-11-07 DIAGNOSIS — Z7902 Long term (current) use of antithrombotics/antiplatelets: Secondary | ICD-10-CM | POA: Insufficient documentation

## 2018-11-07 DIAGNOSIS — R140 Abdominal distension (gaseous): Secondary | ICD-10-CM | POA: Diagnosis not present

## 2018-11-07 DIAGNOSIS — R350 Frequency of micturition: Secondary | ICD-10-CM | POA: Insufficient documentation

## 2018-11-07 DIAGNOSIS — I2510 Atherosclerotic heart disease of native coronary artery without angina pectoris: Secondary | ICD-10-CM | POA: Diagnosis not present

## 2018-11-07 DIAGNOSIS — E785 Hyperlipidemia, unspecified: Secondary | ICD-10-CM | POA: Diagnosis not present

## 2018-11-07 DIAGNOSIS — R739 Hyperglycemia, unspecified: Secondary | ICD-10-CM | POA: Diagnosis present

## 2018-11-07 DIAGNOSIS — K219 Gastro-esophageal reflux disease without esophagitis: Secondary | ICD-10-CM | POA: Diagnosis not present

## 2018-11-07 DIAGNOSIS — R0600 Dyspnea, unspecified: Secondary | ICD-10-CM | POA: Diagnosis present

## 2018-11-07 DIAGNOSIS — N183 Chronic kidney disease, stage 3 (moderate): Secondary | ICD-10-CM | POA: Diagnosis not present

## 2018-11-07 DIAGNOSIS — Z905 Acquired absence of kidney: Secondary | ICD-10-CM | POA: Diagnosis not present

## 2018-11-07 DIAGNOSIS — Z8673 Personal history of transient ischemic attack (TIA), and cerebral infarction without residual deficits: Secondary | ICD-10-CM | POA: Insufficient documentation

## 2018-11-07 DIAGNOSIS — Z8249 Family history of ischemic heart disease and other diseases of the circulatory system: Secondary | ICD-10-CM | POA: Insufficient documentation

## 2018-11-07 DIAGNOSIS — F419 Anxiety disorder, unspecified: Secondary | ICD-10-CM | POA: Insufficient documentation

## 2018-11-07 DIAGNOSIS — Z20828 Contact with and (suspected) exposure to other viral communicable diseases: Secondary | ICD-10-CM | POA: Diagnosis not present

## 2018-11-07 DIAGNOSIS — R0601 Orthopnea: Principal | ICD-10-CM | POA: Insufficient documentation

## 2018-11-07 DIAGNOSIS — F17210 Nicotine dependence, cigarettes, uncomplicated: Secondary | ICD-10-CM | POA: Insufficient documentation

## 2018-11-07 DIAGNOSIS — F329 Major depressive disorder, single episode, unspecified: Secondary | ICD-10-CM | POA: Insufficient documentation

## 2018-11-07 DIAGNOSIS — Z794 Long term (current) use of insulin: Secondary | ICD-10-CM | POA: Insufficient documentation

## 2018-11-07 DIAGNOSIS — N179 Acute kidney failure, unspecified: Secondary | ICD-10-CM | POA: Insufficient documentation

## 2018-11-07 DIAGNOSIS — G4733 Obstructive sleep apnea (adult) (pediatric): Secondary | ICD-10-CM | POA: Insufficient documentation

## 2018-11-07 DIAGNOSIS — R300 Dysuria: Secondary | ICD-10-CM | POA: Insufficient documentation

## 2018-11-07 DIAGNOSIS — I129 Hypertensive chronic kidney disease with stage 1 through stage 4 chronic kidney disease, or unspecified chronic kidney disease: Secondary | ICD-10-CM | POA: Diagnosis not present

## 2018-11-07 DIAGNOSIS — E1122 Type 2 diabetes mellitus with diabetic chronic kidney disease: Secondary | ICD-10-CM | POA: Insufficient documentation

## 2018-11-07 DIAGNOSIS — Z85528 Personal history of other malignant neoplasm of kidney: Secondary | ICD-10-CM | POA: Insufficient documentation

## 2018-11-07 DIAGNOSIS — Z9981 Dependence on supplemental oxygen: Secondary | ICD-10-CM | POA: Diagnosis not present

## 2018-11-07 DIAGNOSIS — R399 Unspecified symptoms and signs involving the genitourinary system: Secondary | ICD-10-CM

## 2018-11-07 DIAGNOSIS — M1990 Unspecified osteoarthritis, unspecified site: Secondary | ICD-10-CM | POA: Insufficient documentation

## 2018-11-07 DIAGNOSIS — Z955 Presence of coronary angioplasty implant and graft: Secondary | ICD-10-CM | POA: Insufficient documentation

## 2018-11-07 DIAGNOSIS — R079 Chest pain, unspecified: Secondary | ICD-10-CM

## 2018-11-07 DIAGNOSIS — Z885 Allergy status to narcotic agent status: Secondary | ICD-10-CM | POA: Insufficient documentation

## 2018-11-07 LAB — COMPREHENSIVE METABOLIC PANEL
ALT: 12 U/L (ref 0–44)
AST: 15 U/L (ref 15–41)
Albumin: 3.5 g/dL (ref 3.5–5.0)
Alkaline Phosphatase: 103 U/L (ref 38–126)
Anion gap: 14 (ref 5–15)
BUN: 20 mg/dL (ref 8–23)
CO2: 22 mmol/L (ref 22–32)
Calcium: 8.5 mg/dL — ABNORMAL LOW (ref 8.9–10.3)
Chloride: 93 mmol/L — ABNORMAL LOW (ref 98–111)
Creatinine, Ser: 1.91 mg/dL — ABNORMAL HIGH (ref 0.61–1.24)
GFR calc Af Amer: 42 mL/min — ABNORMAL LOW (ref >60)
GFR calc non Af Amer: 36 mL/min — ABNORMAL LOW (ref >60)
Glucose, Bld: 539 mg/dL (ref 70–99)
Potassium: 3.7 mmol/L (ref 3.5–5.1)
Sodium: 129 mmol/L — ABNORMAL LOW (ref 135–145)
Total Bilirubin: 0.5 mg/dL (ref 0.3–1.2)
Total Protein: 7.1 g/dL (ref 6.5–8.1)

## 2018-11-07 LAB — TROPONIN I
Troponin I: <0.03 ng/mL (ref <0.03)
Troponin I: <0.03 ng/mL (ref <0.03)

## 2018-11-07 LAB — CBC WITH DIFFERENTIAL/PLATELET
Abs Immature Granulocytes: 0.09 10*3/uL — ABNORMAL HIGH (ref 0.00–0.07)
Basophils Absolute: 0 10*3/uL (ref 0.0–0.1)
Basophils Relative: 0 %
Eosinophils Absolute: 0.1 10*3/uL (ref 0.0–0.5)
Eosinophils Relative: 1 %
HCT: 40.3 % (ref 39.0–52.0)
Hemoglobin: 13.2 g/dL (ref 13.0–17.0)
Immature Granulocytes: 1 %
Lymphocytes Relative: 14 %
Lymphs Abs: 1.2 10*3/uL (ref 0.7–4.0)
MCH: 27.3 pg (ref 26.0–34.0)
MCHC: 32.8 g/dL (ref 30.0–36.0)
MCV: 83.3 fL (ref 80.0–100.0)
Monocytes Absolute: 0.6 10*3/uL (ref 0.1–1.0)
Monocytes Relative: 7 %
Neutro Abs: 7.1 10*3/uL (ref 1.7–7.7)
Neutrophils Relative %: 77 %
Platelets: 197 10*3/uL (ref 150–400)
RBC: 4.84 MIL/uL (ref 4.22–5.81)
RDW: 13.5 % (ref 11.5–15.5)
WBC: 9.2 10*3/uL (ref 4.0–10.5)
nRBC: 0 % (ref 0.0–0.2)

## 2018-11-07 LAB — URINALYSIS, ROUTINE W REFLEX MICROSCOPIC
Bilirubin Urine: NEGATIVE
Glucose, UA: >=500 mg/dL — AB
Hgb urine dipstick: NEGATIVE
Ketones, ur: NEGATIVE mg/dL
Leukocytes,Ua: NEGATIVE
Nitrite: NEGATIVE
Protein, ur: NEGATIVE mg/dL
Specific Gravity, Urine: 1.01 (ref 1.005–1.030)
pH: 5.5 (ref 5.0–8.0)

## 2018-11-07 LAB — AMMONIA: Ammonia: 27 umol/L (ref 9–35)

## 2018-11-07 LAB — URINALYSIS, MICROSCOPIC (REFLEX)
RBC / HPF: NONE SEEN RBC/hpf (ref 0–5)
WBC, UA: NONE SEEN WBC/hpf (ref 0–5)

## 2018-11-07 LAB — POCT I-STAT EG7
Acid-base deficit: 2 mmol/L (ref 0.0–2.0)
Bicarbonate: 23.6 mmol/L (ref 20.0–28.0)
Calcium, Ion: 1.09 mmol/L — ABNORMAL LOW (ref 1.15–1.40)
HCT: 40 % (ref 39.0–52.0)
Hemoglobin: 13.6 g/dL (ref 13.0–17.0)
O2 Saturation: 91 %
Potassium: 4 mmol/L (ref 3.5–5.1)
Sodium: 131 mmol/L — ABNORMAL LOW (ref 135–145)
TCO2: 25 mmol/L (ref 22–32)
pCO2, Ven: 40.6 mmHg — ABNORMAL LOW (ref 44.0–60.0)
pH, Ven: 7.373 (ref 7.250–7.430)
pO2, Ven: 63 mmHg — ABNORMAL HIGH (ref 32.0–45.0)

## 2018-11-07 LAB — LIPASE, BLOOD: Lipase: 42 U/L (ref 11–51)

## 2018-11-07 LAB — LACTIC ACID, PLASMA
Lactic Acid, Venous: 3.9 mmol/L (ref 0.5–1.9)
Lactic Acid, Venous: 2.5 mmol/L (ref 0.5–1.9)

## 2018-11-07 LAB — SARS CORONAVIRUS 2 BY RT PCR (HOSPITAL ORDER, PERFORMED IN ~~LOC~~ HOSPITAL LAB): SARS Coronavirus 2: NEGATIVE

## 2018-11-07 LAB — BRAIN NATRIURETIC PEPTIDE: B Natriuretic Peptide: 72.5 pg/mL (ref 0.0–100.0)

## 2018-11-07 LAB — CBG MONITORING, ED
Glucose-Capillary: 407 mg/dL — ABNORMAL HIGH (ref 70–99)
Glucose-Capillary: 345 mg/dL — ABNORMAL HIGH (ref 70–99)

## 2018-11-07 MED ORDER — SODIUM CHLORIDE 0.9 % IV SOLN
Freq: Once | INTRAVENOUS | Status: AC
  Administered 2018-11-08: 01:00:00 via INTRAVENOUS

## 2018-11-07 MED ORDER — INSULIN ASPART PROT & ASPART (70-30 MIX) 100 UNIT/ML ~~LOC~~ SUSP
SUBCUTANEOUS | Status: AC
  Filled 2018-11-07: qty 10

## 2018-11-07 MED ORDER — INSULIN ASPART PROT & ASPART (70-30 MIX) 100 UNIT/ML ~~LOC~~ SUSP
10.0000 [IU] | Freq: Once | SUBCUTANEOUS | Status: AC
  Administered 2018-11-07: 10 [IU] via SUBCUTANEOUS

## 2018-11-07 MED ORDER — SODIUM CHLORIDE 0.9 % IV SOLN
1.0000 g | Freq: Once | INTRAVENOUS | Status: AC
  Administered 2018-11-08: 1 g via INTRAVENOUS
  Filled 2018-11-07: qty 10

## 2018-11-07 MED ORDER — SODIUM CHLORIDE 0.9 % IV SOLN
500.0000 mg | Freq: Once | INTRAVENOUS | Status: AC
  Administered 2018-11-08: 500 mg via INTRAVENOUS
  Filled 2018-11-07: qty 500

## 2018-11-07 MED ORDER — SODIUM CHLORIDE 0.9 % IV BOLUS
500.0000 mL | Freq: Once | INTRAVENOUS | Status: AC
  Administered 2018-11-07: 500 mL via INTRAVENOUS

## 2018-11-07 MED ORDER — AZITHROMYCIN 500 MG IV SOLR
INTRAVENOUS | Status: AC
  Filled 2018-11-07: qty 500

## 2018-11-07 NOTE — Plan of Care (Signed)
63 years old COPD coronary artery disease hypertension diabetes hyperlipidemia, coming in requiring admission for an AKI hyperglycemia story is that he has had symptoms for 2 weeks started with some diarrhea nonproductive cough and some shortness of breath he is on 6 L of oxygen at baseline noticed that his home O2 readings have been lower for the last few days around 88% He reports feeling as though his abdomen is distended and a 15 pound weight gain in the last 2 weeks intermittent bilateral lower extremity edema.   subjective fevers and chills yesterday   his creatinine and BUN is doubled from his baseline and his sugars came in regularly over 500 but he does not appear to be in DKA because his anion gap is normal    LA 3.9 to 2.5  no UTI no pneumonia and he is COVID 19 negative.   do not see an obvious source of infection  ER treating hyperglycemia and AKI  He got some fluids Mild vascular congestion on CXR  10:37 PM Requested further imaging to clarify if there is any other source of infection given abdominal distention and diarrhea.   Accepted to tele bed CT showing ground glass opacity repeat covid testing

## 2018-11-07 NOTE — ED Provider Notes (Signed)
[facility] EMERGENCY DEPARTMENT Provider Note   CSN: [id number] Arrival date & time: 11/07/18  1633    History   Chief Complaint Chief Complaint  Patient presents with   Fever   Shortness of Breath    HPI [name] is a 63 y.o. male with history of COPD, CAD, hypertension, GERD, hepatitis, hyperlipidemia, renal cell carcinoma status post right nephrectomy, type 2 diabetes mellitus presents for evaluation of acute onset, progressively worsening diarrhea, shortness of breath, cough for 2 weeks.  He reports cough is nonproductive.  He is on 6 L of oxygen via nasal cannula at baseline but reports that he has been "bumping it up "and this morning his SPO2 saturations were 88% on his usual home oxygen.  He notes intermittent chest pains which are relieved with nitroglycerin and are typical of his usual chest pains.  He last experienced this chest pain on Friday and took a nitroglycerin at that time with no recurrence of chest pains.  He reports feeling as though his abdomen is distended and a 15 pound weight gain in the last 2 weeks.  Also notes intermittent bilateral lower extremity edema.  He has felt intermittently confused and experienced subjective fevers and chills yesterday but is unsure what his temperature was.  Today noted his blood sugars were much higher than baseline.  He has been taking Tylenol with little relief.  Found to be hyperglycemic at his PCPs office and was sent to the ED for further evaluation.  No known sick contacts.     The history is provided by the patient.    Past Medical History:  Diagnosis Date   Anxiety    Arthritis    Cholecystitis, acute 12/20/2013   Lap chole 6/5   Cocaine abuse ([name])    COPD (chronic obstructive pulmonary disease) ([name])    Coronary atherosclerosis of native coronary artery    a. 03/09/2013 Cath/PCI: LM nl, LAD: 50p, 17m (2.5x16 promus DES), LCX nl, OM1 min irregs, LPL/LPDA diff dzs, RCA nondom, mod diff dzs, EF  55%.   Depression    Essential hypertension, benign    GERD (gastroesophageal reflux disease)    Headache    Hepatitis Late 1970s   History of nephrolithiasis    History of pneumonia    History of stroke    Right MCA distribution, residual left-sided weakness   Hyperlipidemia    Noncompliance    Renal cell carcinoma ([name])    Status post radical right nephrectomy August 2015   Sleep apnea    On CPAP, 4L O2 no cpap at home yet   Type 2 diabetes mellitus ([place]) 2011    Patient Active Problem List   Diagnosis Date Noted   Acute metabolic encephalopathy 71/12/2692   Polypharmacy 06/03/2018   Diabetes mellitus due to underlying condition, uncontrolled, with hyperglycemia, with long-term current use of insulin ([place]) 06/03/2018   Community acquired pneumonia 05/31/2018   Acute on chronic respiratory failure with hypoxia ([place]) 05/31/2018   Acute respiratory failure ([name])    Decreased mobility and endurance 07/19/2015   CKD (chronic kidney disease) stage 3, GFR 30-59 ml/min ([place]) 07/06/2015   Acute on chronic respiratory failure ([place]) 07/05/2015   COPD exacerbation ([place]) 07/05/2015   Interstitial lung disease ([place]) 04/10/2015   Cough syncope 04/10/2015   Dyspnea 04/10/2015   Atypical chest pain 02/28/2015   Syncope 02/28/2015   Orthostatic dizziness 02/25/2015   Right leg pain 02/25/2015   Right calf pain 02/14/2015   Malaise and  fatigue 02/14/2015   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[place]) 11/12/2014   Smoking 11/12/2014   History of stroke 10/24/2014   Carotid disease, bilateral ([place]) 10/24/2014   Orthostatic hypotension 10/24/2014   Obstructive sleep apnea    HLD (hyperlipidemia)    Acute on chronic renal failure ([name])    Renal cell carcinoma ([name])    Metabolic encephalopathy    Depression    Anxiety state    Noncompliance with medication regimen    Carotid artery stenosis 10/08/2014   Left sided numbness 09/26/2014    Hyperreflexia 09/26/2014   COLD (chronic obstructive lung disease) ([place]) 08/16/2014   ILD (interstitial lung disease) ([place]) 08/16/2014   Dizziness and giddiness 08/16/2014   Other specified hypotension 08/16/2014   Pulmonary edema    Other emphysema ([place])    Snoring 04/30/2014   Fluid overload 02/22/2014   Renal neoplasm 02/21/2014   Preoperative cardiovascular examination 02/07/2014   Renal mass, right 12/21/2013   Coronary atherosclerosis of native coronary artery    Substance abuse ([place])    Hyperlipidemia    Diabetes mellitus type 2, uncontrolled, with cardiovascular complications 87/56/4332   Tobacco abuse 06/01/2012   H/O medication noncompliance 06/01/2012   Cocaine abuse ([place]) 06/01/2012   Essential hypertension, benign     Past Surgical History:  Procedure Laterality Date   APPENDECTOMY  1970's   CHOLECYSTECTOMY N/A 12/22/2013   Procedure: LAPAROSCOPIC CHOLECYSTECTOMY ;  Surgeon: [name], MD;  Location: [facility];  Service: General;  Laterality: N/A;   ENDARTERECTOMY Right 10/08/2014   Procedure: Right ENDARTERECTOMY CAROTID;  Surgeon: [name], MD;  Location: [facility];  Service: Vascular;  Laterality: Right;   LAPAROSCOPIC LYSIS OF ADHESIONS  02/21/2014   Procedure: LAPAROSCOPIC LYSIS OF ADHESIONS EXTINSIVE;  Surgeon: [name], MD;  Location: [facility];  Service: Urology;;   LEFT HEART CATHETERIZATION WITH CORONARY ANGIOGRAM N/A 06/02/2012   Procedure: LEFT HEART CATHETERIZATION WITH CORONARY ANGIOGRAM;  Surgeon: [name], MD;  [facility] CATH LAB;  Service: Cardiovascular;  Laterality: N/A;   LEFT HEART CATHETERIZATION WITH CORONARY ANGIOGRAM N/A 03/09/2013   Procedure: LEFT HEART CATHETERIZATION WITH CORONARY ANGIOGRAM;  Surgeon: [name], MD;  Location: [facility] CATH LAB;  Service: Cardiovascular;  Laterality: N/A;   LEFT HEART CATHETERIZATION WITH CORONARY ANGIOGRAM N/A 12/20/2013   Procedure: LEFT HEART CATHETERIZATION WITH  CORONARY ANGIOGRAM;  Surgeon: [name], MD;  Location: [facility] CATH LAB;  Service: Cardiovascular;  Laterality: N/A;   LUNG BIOPSY Left 05/18/2014   Procedure: LUNG BIOPSY left upper lobe & left lower lobe;  Surgeon: [name], MD;  Location: [facility];  Service: Thoracic;  Laterality: Left;   PATCH ANGIOPLASTY Right 10/08/2014   Procedure: PATCH ANGIOPLASTY Right Carotid;  Surgeon: [name], MD;  Location: [facility];  Service: Vascular;  Laterality: Right;   ROBOT ASSISTED LAPAROSCOPIC NEPHRECTOMY Right 02/21/2014   Procedure: ROBOTIC ASSISTED LAPAROSCOPIC RIGHT NEPHRECTOMY ;  Surgeon: [name], MD;  Location: [facility];  Service: Urology;  Laterality: Right;   VIDEO ASSISTED THORACOSCOPY Left 05/18/2014   Procedure: LEFT VIDEO ASSISTED THORACOSCOPY;  Surgeon: [name], MD;  Location: [facility];  Service: Thoracic;  Laterality: Left;   VIDEO BRONCHOSCOPY  05/18/2014   Procedure: VIDEO BRONCHOSCOPY;  Surgeon: [name], MD;  Location: [facility];  Service: Thoracic;;        Home Medications    Prior to Admission medications   Medication Sig Start Date End Date Taking? Authorizing Provider  ALPRAZolam Duanne Moron) 0.5  MG tablet Take 1 tablet (0.5 mg total) by mouth 3 (three) times daily as needed for anxiety. 06/03/18   Dhungel, Flonnie Overman, MD  atorvastatin (LIPITOR) 20 MG tablet Take 20 mg by mouth at bedtime. 04/26/18   [provider]  clopidogrel (PLAVIX) 75 MG tablet Take 1 tablet (75 mg total) by mouth daily. 02/01/18   Satira Sark, MD  diltiazem (CARDIZEM CD) 240 MG 24 hr capsule Take 240 mg by mouth daily.    [provider]  docusate sodium (COLACE) 100 MG capsule Take 100 mg by mouth at bedtime.    [provider]  FLUoxetine (PROZAC) 10 MG capsule Take 10 mg by mouth every morning. Take with 40 mg capsule = 50 mg total 03/01/18   [provider]  FLUoxetine (PROZAC) 40 MG capsule Take 40 mg by mouth daily. Take  with 10 mg capsule = 50 mg total    [provider]  gabapentin (NEURONTIN) 300 MG capsule Take 1 capsule (300 mg total) by mouth 3 (three) times daily. 06/03/18   Dhungel, Flonnie Overman, MD  Glycopyrrolate-Formoterol (BEVESPI AEROSPHERE) 9-4.8 MCG/ACT AERO Inhale 2 puffs into the lungs 2 (two) times daily. 03/30/18   Brand Males, MD  insulin aspart (NOVOLOG) 100 UNIT/ML injection Inject into the skin 3 (three) times daily before meals.    [provider]  isosorbide mononitrate (IMDUR) 60 MG 24 hr tablet Take 1 tablet (60 mg total) by mouth daily. 02/01/18   Satira Sark, MD  LANTUS SOLOSTAR 100 UNIT/ML Solostar Pen Inject 120 Units into the skin 2 (two) times daily.  05/25/18   [provider]  linagliptin (TRADJENTA) 5 MG TABS tablet Take 5 mg by mouth daily.    [provider]  LORazepam (ATIVAN) 1 MG tablet Take 5 mg by mouth at bedtime.    [provider]  nicotine (NICODERM CQ - DOSED IN MG/24 HOURS) 14 mg/24hr patch Place 1 patch (14 mg total) onto the skin daily. 06/04/18   Dhungel, Flonnie Overman, MD  nitroGLYCERIN (NITROSTAT) 0.4 MG SL tablet PLACE ONE TABLET UNDER THE TONGUE EVERY FIVE MINUTES AS NEEDED FOR CHEST PAIN. IF 3 TABLETS ARE NECESSARY CALL 911 10/18/18   Satira Sark, MD  oxybutynin (DITROPAN-XL) 10 MG 24 hr tablet Take 10 mg by mouth daily. 02/22/18   [provider]  oxyCODONE-acetaminophen (PERCOCET) 10-325 MG tablet Take 1 tablet by mouth every 6 (six) hours as needed for pain. 06/03/18   Dhungel, Nishant, MD  pantoprazole (PROTONIX) 40 MG tablet Take 40 mg by mouth daily.    [provider]  potassium chloride SA (K-DUR,KLOR-CON) 20 MEQ tablet Take 20 mEq by mouth 3 (three) times daily.    [provider]  Probiotic Product (PROBIOTIC PO) Take 1 capsule by mouth daily.    [provider]  tamsulosin (FLOMAX) 0.4 MG CAPS capsule Take 0.4 mg by mouth daily.     [provider]  torsemide  (DEMADEX) 20 MG tablet TAKE THREE TABLETS BY MOUTH TWICE DAILY 06/24/18   Satira Sark, MD    Family History Family History  Problem Relation Age of Onset   Cancer Mother        Thyroid - living in her 47's.   Hypertension Other    Diabetes Other    Stroke Other    Lung cancer Father    CAD Father    Cancer Maternal Grandmother        Breast   Cancer Maternal Grandfather  Throat and stomach   CAD Brother     Social History Social History   Tobacco Use   Smoking status: Current Some Day Smoker    Packs/day: 1.00    Years: 41.00    Pack years: 41.00    Types: Cigarettes    Start date: 05/03/1972   Smokeless tobacco: Never Used   Tobacco comment: 2cigs per day as of 08/01/2018  Substance Use Topics   Alcohol use: No    Alcohol/week: 0.0 standard drinks   Drug use: No    Types: Cocaine    Comment: last used cocaine 11/2013. but is cocaine + this admit July 2015     Allergies   Oxycodone   Review of Systems Review of Systems  Constitutional: Positive for chills, fatigue and fever.  Respiratory: Positive for cough and shortness of breath.   Cardiovascular: Positive for chest pain and leg swelling.  Gastrointestinal: Positive for abdominal distention and diarrhea. Negative for nausea and vomiting.  Psychiatric/Behavioral: Positive for confusion.  All other systems reviewed and are negative.    Physical Exam Updated Vital Signs BP 132/75    Pulse 83    Temp 99.8 F (37.7 C) (Rectal)    Resp (!) 21    Ht 5\' 8"  (1.727 m)    Wt 104.3 kg    SpO2 96%    BMI 34.97 kg/m   Physical Exam Vitals signs and nursing note reviewed.  Constitutional:      General: He is not in acute distress.    Appearance: He is well-developed.  HENT:     Head: Normocephalic and atraumatic.  Eyes:     General:        Right eye: No discharge.        Left eye: No discharge.     Conjunctiva/sclera: Conjunctivae normal.  Neck:     Vascular: No JVD.     Trachea:  No tracheal deviation.  Cardiovascular:     Rate and Rhythm: Normal rate and regular rhythm.     Pulses: Normal pulses.  Pulmonary:     Effort: Pulmonary effort is normal.     Comments: Globally diminished breath sounds, speaking in full sentences on 6L [place] without difficulty, SpO2 saturations 98% Abdominal:     General: Abdomen is protuberant. Bowel sounds are normal. There is no distension.     Tenderness: There is abdominal tenderness in the right upper quadrant, epigastric area and left upper quadrant. There is no guarding or rebound. Negative signs include Murphy's sign and Rovsing's sign.  Musculoskeletal:     Right lower leg: He exhibits no tenderness. No edema.     Left lower leg: He exhibits no tenderness. No edema.  Skin:    General: Skin is warm and dry.     Findings: No erythema.  Neurological:     Mental Status: He is alert.     Comments: Fluent speech, no facial droop, cranial nerves appear grossly intact. Moving extremities spontaneously with intact strength.   Psychiatric:        Behavior: Behavior normal.      ED Treatments / Results  Labs (all labs ordered are listed, but only abnormal results are displayed) Labs Reviewed  LACTIC ACID, PLASMA - Abnormal; Notable for the following components:      Result Value   Lactic Acid, Venous 3.9 (*)    All other components within normal limits  LACTIC ACID, PLASMA - Abnormal; Notable for the following components:   Lactic Acid, Venous  2.5 (*)    All other components within normal limits  COMPREHENSIVE METABOLIC PANEL - Abnormal; Notable for the following components:   Sodium 129 (*)    Chloride 93 (*)    Glucose, Bld 539 (*)    Creatinine, Ser 1.91 (*)    Calcium 8.5 (*)    GFR calc non Af Amer 36 (*)    GFR calc Af Amer 42 (*)    All other components within normal limits  CBC WITH DIFFERENTIAL/PLATELET - Abnormal; Notable for the following components:   Abs Immature Granulocytes 0.09 (*)    All other components  within normal limits  URINALYSIS, ROUTINE W REFLEX MICROSCOPIC - Abnormal; Notable for the following components:   Glucose, UA >=500 (*)    All other components within normal limits  URINALYSIS, MICROSCOPIC (REFLEX) - Abnormal; Notable for the following components:   Bacteria, UA RARE (*)    All other components within normal limits  POCT I-STAT EG7 - Abnormal; Notable for the following components:   pCO2, Ven 40.6 (*)    pO2, Ven 63.0 (*)    Sodium 131 (*)    Calcium, Ion 1.09 (*)    All other components within normal limits  CBG MONITORING, ED - Abnormal; Notable for the following components:   Glucose-Capillary 407 (*)    All other components within normal limits  CBG MONITORING, ED - Abnormal; Notable for the following components:   Glucose-Capillary 345 (*)    All other components within normal limits  SARS CORONAVIRUS 2 (HOSPITAL ORDER, [facility])  CULTURE, BLOOD (ROUTINE X 2)  CULTURE, BLOOD (ROUTINE X 2)  URINE CULTURE  BRAIN NATRIURETIC PEPTIDE  LIPASE, BLOOD  TROPONIN I  AMMONIA  TROPONIN I  HIV ANTIBODY (ROUTINE TESTING W REFLEX)  TROPONIN I  TROPONIN I  I-STAT VENOUS BLOOD GAS, ED    EKG EKG Interpretation  Date/Time:  Monday November 07 2018 16:48:07 EDT Ventricular Rate:  86 PR Interval:    QRS Duration: 92 QT Interval:  350 QTC Calculation: 419 R Axis:   -57 Text Interpretation:  Sinus or ectopic atrial rhythm Atrial premature complex Left anterior fascicular block Abnormal R-wave progression, late transition occasional PAC compared to previous Confirmed by Theotis Burrow [phone]) on 11/07/2018 4:50:08 PM   Radiology Dg Chest Port 1 View  Result Date: 11/07/2018 CLINICAL DATA:  Short of breath EXAM: PORTABLE CHEST 1 VIEW COMPARISON:  05/30/2018 FINDINGS: Cardiac enlargement. Mild vascular congestion without edema or effusion Chronic pleural and parenchymal scarring left base unchanged. Atherosclerotic aortic arch. IMPRESSION:  Chronic scarring in the left lung base unchanged Mild vascular congestion without edema. Electronically Signed   By: Franchot Gallo M.D.   On: 11/07/2018 18:35    Procedures Procedures (including critical care time)  Medications Ordered in ED Medications  0.9 %  sodium chloride infusion (has no administration in time range)  sodium chloride 0.9 % bolus 500 mL ( Intravenous Stopped 11/07/18 1903)  insulin aspart protamine- aspart (NOVOLOG MIX 70/30) injection 10 Units (10 Units Subcutaneous Given 11/07/18 1813)  sodium chloride 0.9 % bolus 500 mL (0 mLs Intravenous Stopped 11/07/18 2228)     Initial Impression / Assessment and Plan / ED Course  I have reviewed the triage vital signs and the nursing notes.  Pertinent labs & imaging results that were available during my care of the patient were reviewed by me and considered in my medical decision making (see chart for details).  Patient presenting for evaluation of diarrhea, nonproductive cough, shortness of breath, subjective fevers and chills.  He is afebrile in the ED with rectal temp within normal limits, vital signs otherwise stable.  He does report that he has been increasing his home O2 due to shortness of breath but is stable on his home oxygen in the ED today.  Chest x-ray shows no evidence of pneumonia but does show chronic scarring in the left lung base and mild vascular congestion without pleural effusion.  UA does not suggest UTI or nephrolithiasis.  Lab work reviewed by me significant for hyperglycemia and elevated lactate.  He does not meet sepsis criteria as he has no fever, tachycardia, or leukocytosis.  He does not appear to be in DKA as anion gap is within normal limits and he has no ketonuria, and VBG shows normal blood pH.  Corrected sodium of 136.  Sugars improved with subcutaneous insulin and IV fluid boluses.  He does have an AKI with elevated creatinine and BUN doubled above baseline.  He tested negative for COVID-19.  Spoke with Dr. Roel Cluck with [facility] who recommends obtaining CT chest and abdomen/pelvis for further evaluation and characterization of possible infection.  She agrees to assume care of patient and he will be transferred to [facility] for further evaluation and management of symptoms.  Patient evaluated by Dr. Rex Kras who agrees with assessment and plan at this time.  KONG PACKETT was evaluated in Emergency Department on 11/07/2018 for the symptoms described in the history of present illness. He was evaluated in the context of the global COVID-19 pandemic, which necessitated consideration that the patient might be at risk for infection with the SARS-CoV-2 virus that causes COVID-19. Institutional protocols and algorithms that pertain to the evaluation of patients at risk for COVID-19 are in a state of rapid change based on information released by regulatory bodies including the CDC and federal and state organizations. These policies and algorithms were followed during the patient's care in the ED.  Final Clinical Impressions(s) / ED Diagnoses   Final diagnoses:  AKI (acute kidney injury) [facility])  Hyperglycemia    ED Discharge Orders    None       Debroah Baller 11/07/18 2335    Little, Wenda Overland, MD 11/07/18 [phone]

## 2018-11-07 NOTE — ED Triage Notes (Signed)
Fever, confusion, SOB for over 2 weeks. He uses home oxygen. Skin is flushed and hot to touch.

## 2018-11-07 NOTE — ED Notes (Signed)
Critical lab received for Lactic Acid 2.5; Dr Rex Kras notified.

## 2018-11-08 DIAGNOSIS — K760 Fatty (change of) liver, not elsewhere classified: Secondary | ICD-10-CM | POA: Diagnosis not present

## 2018-11-08 DIAGNOSIS — Z66 Do not resuscitate: Secondary | ICD-10-CM | POA: Diagnosis not present

## 2018-11-08 DIAGNOSIS — N179 Acute kidney failure, unspecified: Secondary | ICD-10-CM | POA: Diagnosis present

## 2018-11-08 DIAGNOSIS — Z9981 Dependence on supplemental oxygen: Secondary | ICD-10-CM | POA: Diagnosis not present

## 2018-11-08 DIAGNOSIS — R0789 Other chest pain: Secondary | ICD-10-CM | POA: Diagnosis not present

## 2018-11-08 DIAGNOSIS — E1165 Type 2 diabetes mellitus with hyperglycemia: Secondary | ICD-10-CM

## 2018-11-08 DIAGNOSIS — N183 Chronic kidney disease, stage 3 (moderate): Secondary | ICD-10-CM | POA: Diagnosis not present

## 2018-11-08 DIAGNOSIS — R079 Chest pain, unspecified: Secondary | ICD-10-CM

## 2018-11-08 DIAGNOSIS — R350 Frequency of micturition: Secondary | ICD-10-CM | POA: Diagnosis not present

## 2018-11-08 DIAGNOSIS — R399 Unspecified symptoms and signs involving the genitourinary system: Secondary | ICD-10-CM

## 2018-11-08 DIAGNOSIS — K219 Gastro-esophageal reflux disease without esophagitis: Secondary | ICD-10-CM | POA: Diagnosis not present

## 2018-11-08 DIAGNOSIS — E1122 Type 2 diabetes mellitus with diabetic chronic kidney disease: Secondary | ICD-10-CM | POA: Diagnosis not present

## 2018-11-08 DIAGNOSIS — R0600 Dyspnea, unspecified: Secondary | ICD-10-CM | POA: Diagnosis not present

## 2018-11-08 DIAGNOSIS — R739 Hyperglycemia, unspecified: Secondary | ICD-10-CM

## 2018-11-08 DIAGNOSIS — R05 Cough: Secondary | ICD-10-CM | POA: Diagnosis not present

## 2018-11-08 DIAGNOSIS — I129 Hypertensive chronic kidney disease with stage 1 through stage 4 chronic kidney disease, or unspecified chronic kidney disease: Secondary | ICD-10-CM | POA: Diagnosis not present

## 2018-11-08 DIAGNOSIS — Z20828 Contact with and (suspected) exposure to other viral communicable diseases: Secondary | ICD-10-CM | POA: Diagnosis not present

## 2018-11-08 DIAGNOSIS — Z905 Acquired absence of kidney: Secondary | ICD-10-CM | POA: Diagnosis not present

## 2018-11-08 DIAGNOSIS — F419 Anxiety disorder, unspecified: Secondary | ICD-10-CM | POA: Diagnosis not present

## 2018-11-08 DIAGNOSIS — R3915 Urgency of urination: Secondary | ICD-10-CM | POA: Diagnosis not present

## 2018-11-08 DIAGNOSIS — R140 Abdominal distension (gaseous): Secondary | ICD-10-CM

## 2018-11-08 DIAGNOSIS — R300 Dysuria: Secondary | ICD-10-CM | POA: Diagnosis not present

## 2018-11-08 DIAGNOSIS — R0601 Orthopnea: Secondary | ICD-10-CM | POA: Diagnosis not present

## 2018-11-08 DIAGNOSIS — G4733 Obstructive sleep apnea (adult) (pediatric): Secondary | ICD-10-CM | POA: Diagnosis not present

## 2018-11-08 DIAGNOSIS — Z85528 Personal history of other malignant neoplasm of kidney: Secondary | ICD-10-CM | POA: Diagnosis not present

## 2018-11-08 DIAGNOSIS — I2510 Atherosclerotic heart disease of native coronary artery without angina pectoris: Secondary | ICD-10-CM | POA: Diagnosis not present

## 2018-11-08 DIAGNOSIS — E785 Hyperlipidemia, unspecified: Secondary | ICD-10-CM | POA: Diagnosis not present

## 2018-11-08 DIAGNOSIS — J449 Chronic obstructive pulmonary disease, unspecified: Secondary | ICD-10-CM | POA: Diagnosis not present

## 2018-11-08 HISTORY — DX: Acute kidney failure, unspecified: N17.9

## 2018-11-08 LAB — BASIC METABOLIC PANEL
Anion gap: 8 (ref 5–15)
BUN: 18 mg/dL (ref 8–23)
CO2: 23 mmol/L (ref 22–32)
Calcium: 8.2 mg/dL — ABNORMAL LOW (ref 8.9–10.3)
Chloride: 102 mmol/L (ref 98–111)
Creatinine, Ser: 1.4 mg/dL — ABNORMAL HIGH (ref 0.61–1.24)
GFR calc Af Amer: >60 mL/min (ref >60)
GFR calc non Af Amer: 53 mL/min — ABNORMAL LOW (ref >60)
Glucose, Bld: 360 mg/dL — ABNORMAL HIGH (ref 70–99)
Potassium: 3.9 mmol/L (ref 3.5–5.1)
Sodium: 133 mmol/L — ABNORMAL LOW (ref 135–145)

## 2018-11-08 LAB — URINE CULTURE: Culture: NO GROWTH

## 2018-11-08 LAB — HIV ANTIBODY (ROUTINE TESTING W REFLEX): HIV Screen 4th Generation wRfx: NONREACTIVE

## 2018-11-08 LAB — FERRITIN: Ferritin: 51 ng/mL (ref 24–336)

## 2018-11-08 LAB — GLUCOSE, CAPILLARY
Glucose-Capillary: 341 mg/dL — ABNORMAL HIGH (ref 70–99)
Glucose-Capillary: 323 mg/dL — ABNORMAL HIGH (ref 70–99)
Glucose-Capillary: 272 mg/dL — ABNORMAL HIGH (ref 70–99)
Glucose-Capillary: 277 mg/dL — ABNORMAL HIGH (ref 70–99)

## 2018-11-08 LAB — D-DIMER, QUANTITATIVE: D-Dimer, Quant: 0.73 ug/mL-FEU — ABNORMAL HIGH (ref 0.00–0.50)

## 2018-11-08 LAB — PROCALCITONIN: Procalcitonin: <0.1 ng/mL

## 2018-11-08 LAB — FIBRINOGEN: Fibrinogen: 479 mg/dL — ABNORMAL HIGH (ref 210–475)

## 2018-11-08 LAB — C-REACTIVE PROTEIN: CRP: 5.5 mg/dL — ABNORMAL HIGH (ref <1.0)

## 2018-11-08 LAB — HEMOGLOBIN A1C
Hgb A1c MFr Bld: 12.1 % — ABNORMAL HIGH (ref 4.8–5.6)
Mean Plasma Glucose: 300.57 mg/dL

## 2018-11-08 MED ORDER — ENOXAPARIN SODIUM 40 MG/0.4ML ~~LOC~~ SOLN
40.0000 mg | SUBCUTANEOUS | Status: DC
  Administered 2018-11-08 – 2018-11-09 (×2): 40 mg via SUBCUTANEOUS
  Filled 2018-11-08 (×2): qty 0.4

## 2018-11-08 MED ORDER — ACETAMINOPHEN 325 MG PO TABS
650.0000 mg | ORAL_TABLET | Freq: Four times a day (QID) | ORAL | Status: DC | PRN
  Administered 2018-11-09: 650 mg via ORAL
  Filled 2018-11-08: qty 2

## 2018-11-08 MED ORDER — SODIUM CHLORIDE 0.9 % IV SOLN
500.0000 mg | INTRAVENOUS | Status: DC
  Administered 2018-11-09: 500 mg via INTRAVENOUS
  Filled 2018-11-08: qty 500

## 2018-11-08 MED ORDER — NITROGLYCERIN 0.4 MG SL SUBL
0.4000 mg | SUBLINGUAL_TABLET | SUBLINGUAL | Status: DC | PRN
  Filled 2018-11-08: qty 1

## 2018-11-08 MED ORDER — DILTIAZEM HCL ER COATED BEADS 240 MG PO CP24
240.0000 mg | ORAL_CAPSULE | Freq: Every day | ORAL | Status: DC
  Administered 2018-11-08 – 2018-11-09 (×2): 240 mg via ORAL
  Filled 2018-11-08 (×3): qty 1

## 2018-11-08 MED ORDER — INSULIN ASPART 100 UNIT/ML ~~LOC~~ SOLN
0.0000 [IU] | Freq: Every day | SUBCUTANEOUS | Status: DC
  Administered 2018-11-09: 4 [IU] via SUBCUTANEOUS

## 2018-11-08 MED ORDER — IPRATROPIUM-ALBUTEROL 20-100 MCG/ACT IN AERS
1.0000 | INHALATION_SPRAY | Freq: Four times a day (QID) | RESPIRATORY_TRACT | Status: DC
  Administered 2018-11-08 – 2018-11-09 (×5): 1 via RESPIRATORY_TRACT
  Filled 2018-11-08 (×2): qty 4

## 2018-11-08 MED ORDER — SODIUM CHLORIDE 0.9 % IV SOLN
INTRAVENOUS | Status: AC
  Administered 2018-11-08 (×2): via INTRAVENOUS

## 2018-11-08 MED ORDER — SODIUM CHLORIDE 0.9 % IV SOLN
1.0000 g | INTRAVENOUS | Status: DC
  Administered 2018-11-09: 1 g via INTRAVENOUS
  Filled 2018-11-08: qty 1

## 2018-11-08 MED ORDER — ISOSORBIDE MONONITRATE ER 60 MG PO TB24
60.0000 mg | ORAL_TABLET | Freq: Every day | ORAL | Status: DC
  Administered 2018-11-08 – 2018-11-09 (×2): 60 mg via ORAL
  Filled 2018-11-08 (×3): qty 1

## 2018-11-08 MED ORDER — INSULIN ASPART 100 UNIT/ML ~~LOC~~ SOLN
0.0000 [IU] | Freq: Three times a day (TID) | SUBCUTANEOUS | Status: DC
  Administered 2018-11-08: 8 [IU] via SUBCUTANEOUS
  Administered 2018-11-08: 11 [IU] via SUBCUTANEOUS
  Administered 2018-11-08 – 2018-11-09 (×2): 8 [IU] via SUBCUTANEOUS

## 2018-11-08 MED ORDER — ACETAMINOPHEN 650 MG RE SUPP: 650.0000 mg | Freq: Four times a day (QID) | RECTAL | Status: DC | PRN

## 2018-11-08 MED ORDER — ALBUTEROL SULFATE HFA 108 (90 BASE) MCG/ACT IN AERS
1.0000 | INHALATION_SPRAY | RESPIRATORY_TRACT | Status: DC | PRN
  Filled 2018-11-08: qty 6.7

## 2018-11-08 MED ORDER — ATORVASTATIN CALCIUM 20 MG PO TABS
20.0000 mg | ORAL_TABLET | Freq: Every day | ORAL | Status: DC
  Administered 2018-11-08: 20 mg via ORAL
  Filled 2018-11-08: qty 1

## 2018-11-08 MED ORDER — INSULIN GLARGINE 100 UNIT/ML ~~LOC~~ SOLN
110.0000 [IU] | Freq: Two times a day (BID) | SUBCUTANEOUS | Status: DC
  Administered 2018-11-08 – 2018-11-09 (×3): 110 [IU] via SUBCUTANEOUS
  Filled 2018-11-08 (×4): qty 1.1

## 2018-11-08 MED ORDER — ORAL CARE MOUTH RINSE
15.0000 mL | Freq: Two times a day (BID) | OROMUCOSAL | Status: DC
  Administered 2018-11-08 – 2018-11-09 (×3): 15 mL via OROMUCOSAL

## 2018-11-08 MED ORDER — CLOPIDOGREL BISULFATE 75 MG PO TABS
75.0000 mg | ORAL_TABLET | Freq: Every day | ORAL | Status: DC
  Administered 2018-11-08 – 2018-11-09 (×2): 75 mg via ORAL
  Filled 2018-11-08 (×2): qty 1

## 2018-11-08 NOTE — Progress Notes (Signed)
Pt c/o sharp, substernal chest pain that radiates to the right chest and right arm. Pt rates pain as 7/10. Stated it started after sitting up to EOB. Dr. Lonny Prude notified. New orders received for Nitro SL PRN. Will also give morning dose of Imdur that was held prior d/t low BP. Will continue to monitor.

## 2018-11-08 NOTE — ED Notes (Signed)
[facility] notified ([facility]) - patient ready for transport 

## 2018-11-08 NOTE — ED Notes (Signed)
ED TO INPATIENT HANDOFF REPORT  ED Nurse Name and Phone #: Minna Merritts RN   S Name/Age/Gender [name] 63 y.o. male Room/Bed: MH05/MH05  Code Status   Code Status: Prior  Home/SNF/Other Home Patient oriented to: self, place, time and situation Is this baseline? Yes   Triage Complete: Triage complete  Chief Complaint lung pain and blood sugar up  Triage Note Fever, confusion, SOB for over 2 weeks. He uses home oxygen. Skin is flushed and hot to touch.   Allergies Allergies  Allergen Reactions  . Oxycodone Itching and Nausea Only    Pt is able to tolerate if taken with benadryl    Level of Care/Admitting Diagnosis ED Disposition    ED Disposition Condition Comment   Admit  Hospital Area: [place] [[id number]]  Level of Care: Telemetry [5]  Admit to tele based on following criteria: Other see comments  Comments: dyspnea  Covid Evaluation: Person Under Investigation (PUI)  Isolation Risk Level: Comment  Comment: at baseline on 6L no significant increase from baseline, initial testing neg  Diagnosis: CAP (community acquired pneumonia) [300762]  Admitting Physician: Toy Baker [[id number]]  Attending Physician: Toy Baker [[id number]]  PT Class (Do Not Modify): Observation [104]  PT Acc Code (Do Not Modify): Observation [10022]       B Medical/Surgery History Past Medical History:  Diagnosis Date  . Anxiety   . Arthritis   . Cholecystitis, acute 12/20/2013   Lap chole 6/5  . Cocaine abuse ([place])   . COPD (chronic obstructive pulmonary disease) ([place])   . Coronary atherosclerosis of native coronary artery    a. 03/09/2013 Cath/PCI: LM nl, LAD: 50p, 22m (2.5x16 promus DES), LCX nl, OM1 min irregs, LPL/LPDA diff dzs, RCA nondom, mod diff dzs, EF 55%.  . Depression   . Essential hypertension, benign   . GERD (gastroesophageal reflux disease)   . Headache   . Hepatitis Late 1970s  . History of nephrolithiasis   . History of pneumonia   .  History of stroke    Right MCA distribution, residual left-sided weakness  . Hyperlipidemia   . Noncompliance   . Renal cell carcinoma [facility])    Status post radical right nephrectomy August 2015  . Sleep apnea    On CPAP, 4L O2 no cpap at home yet  . Type 2 diabetes mellitus ([place]) 2011   Past Surgical History:  Procedure Laterality Date  . APPENDECTOMY  1970's  . CHOLECYSTECTOMY N/A 12/22/2013   Procedure: LAPAROSCOPIC CHOLECYSTECTOMY ;  Surgeon: [name], MD;  Location: [place];  Service: General;  Laterality: N/A;  . ENDARTERECTOMY Right 10/08/2014   Procedure: Right ENDARTERECTOMY CAROTID;  Surgeon: [name], MD;  Location: [place];  Service: Vascular;  Laterality: Right;  . LAPAROSCOPIC LYSIS OF ADHESIONS  02/21/2014   Procedure: LAPAROSCOPIC LYSIS OF ADHESIONS EXTINSIVE;  Surgeon: [name], MD;  Location: [facility];  Service: Urology;;  . LEFT HEART CATHETERIZATION WITH CORONARY ANGIOGRAM N/A 06/02/2012   Procedure: LEFT HEART CATHETERIZATION WITH CORONARY ANGIOGRAM;  Surgeon: [name], MD;  Location: [facility] CATH LAB;  Service: Cardiovascular;  Laterality: N/A;  . LEFT HEART CATHETERIZATION WITH CORONARY ANGIOGRAM N/A 03/09/2013   Procedure: LEFT HEART CATHETERIZATION WITH CORONARY ANGIOGRAM;  Surgeon: [name], MD;  Location: [facility] CATH LAB;  Service: Cardiovascular;  Laterality: N/A;  . LEFT HEART CATHETERIZATION WITH CORONARY ANGIOGRAM N/A 12/20/2013   Procedure: LEFT HEART CATHETERIZATION WITH CORONARY ANGIOGRAM;  Surgeon: [name], MD;  Location:  [place] CATH LAB;  Service: Cardiovascular;  Laterality: N/A;  . LUNG BIOPSY Left 05/18/2014   Procedure: LUNG BIOPSY left upper lobe & left lower lobe;  Surgeon: [name], MD;  Location: [place];  Service: Thoracic;  Laterality: Left;  . PATCH ANGIOPLASTY Right 10/08/2014   Procedure: PATCH ANGIOPLASTY Right Carotid;  Surgeon: [name], MD;  Location: [place];  Service: Vascular;  Laterality: Right;  .  ROBOT ASSISTED LAPAROSCOPIC NEPHRECTOMY Right 02/21/2014   Procedure: ROBOTIC ASSISTED LAPAROSCOPIC RIGHT NEPHRECTOMY ;  Surgeon: [name], MD;  Location: [facility];  Service: Urology;  Laterality: Right;  [name] VIDEO ASSISTED THORACOSCOPY Left 05/18/2014   Procedure: LEFT VIDEO ASSISTED THORACOSCOPY;  Surgeon: [name], MD;  Location: [place];  Service: Thoracic;  Laterality: Left;  [name] VIDEO BRONCHOSCOPY  05/18/2014   Procedure: VIDEO BRONCHOSCOPY;  Surgeon: [name], MD;  Location: [place];  Service: Thoracic;;     A IV Location/Drains/Wounds Patient Lines/Drains/Airways Status   Active Line/Drains/Airways    Name:   Placement date:   Placement time:   Site:   Days:   Peripheral IV 11/07/18 Left Antecubital   11/07/18    1655    Antecubital   1   Peripheral IV 11/07/18 Right Forearm   11/07/18    1705    Forearm   1   Incision (Closed) 10/08/14 Neck Right   10/08/14    1020     1492          Intake/Output Last 24 hours  Intake/Output Summary (Last 24 hours) at 11/08/2018 0007 Last data filed at 11/07/2018 2228 Gross per 24 hour  Intake 1003.73 ml  Output -  Net 1003.73 ml    Labs/Imaging Results for orders placed or performed during the hospital encounter of 11/07/18 (from the past 48 hour(s))  Lactic acid, plasma     Status: Abnormal   Collection Time: 11/07/18  4:58 PM  Result Value Ref Range   Lactic Acid, Venous 3.9 (HH) 0.5 - 1.9 mmol/L    Comment: CRITICAL RESULT CALLED TO, READ BACK BY AND VERIFIED WITH: AMY HARTLEY RN AT 1736 ON 11/07/18 BY I.[email or web address] Performed at [facility], [address] [place], [place] [address]   Comprehensive metabolic panel     Status: Abnormal   Collection Time: 11/07/18  4:58 PM  Result Value Ref Range   Sodium 129 (L) 135 - 145 mmol/L   Potassium 3.7 3.5 - 5.1 mmol/L   Chloride 93 (L) 98 - 111 mmol/L   CO2 22 22 - 32 mmol/L   Glucose, Bld 539 (HH) 70 - 99 mg/dL    Comment: CRITICAL RESULT CALLED TO, READ  BACK BY AND VERIFIED WITH: AMY HARTLEY RN AT 1737 ON 11/07/18 BY I.[email or web address]    BUN 20 8 - 23 mg/dL   Creatinine, Ser 1.91 (H) 0.61 - 1.24 mg/dL   Calcium 8.5 (L) 8.9 - 10.3 mg/dL   Total Protein 7.1 6.5 - 8.1 g/dL   Albumin 3.5 3.5 - 5.0 g/dL   AST 15 15 - 41 U/L   ALT 12 0 - 44 U/L   Alkaline Phosphatase 103 38 - 126 U/L   Total Bilirubin 0.5 0.3 - 1.2 mg/dL   GFR calc non Af Amer 36 (L) >60 mL/min   GFR calc Af Amer 42 (L) >60 mL/min   Anion gap 14 5 - 15    Comment: Performed at [facility], [address] [place], [place] [address]  CBC WITH DIFFERENTIAL     Status: Abnormal   Collection Time: 11/07/18  4:58 PM  Result Value Ref Range   WBC 9.2 4.0 - 10.5 K/uL   RBC 4.84 4.22 - 5.81 MIL/uL   Hemoglobin 13.2 13.0 - 17.0 g/dL   HCT 40.3 39.0 - 52.0 %   MCV 83.3 80.0 - 100.0 fL   MCH 27.3 26.0 - 34.0 pg   MCHC 32.8 30.0 - 36.0 g/dL   RDW 13.5 11.5 - 15.5 %   Platelets 197 150 - 400 K/uL   nRBC 0.0 0.0 - 0.2 %   Neutrophils Relative % 77 %   Neutro Abs 7.1 1.7 - 7.7 K/uL   Lymphocytes Relative 14 %   Lymphs Abs 1.2 0.7 - 4.0 K/uL   Monocytes Relative 7 %   Monocytes Absolute 0.6 0.1 - 1.0 K/uL   Eosinophils Relative 1 %   Eosinophils Absolute 0.1 0.0 - 0.5 K/uL   Basophils Relative 0 %   Basophils Absolute 0.0 0.0 - 0.1 K/uL   Immature Granulocytes 1 %   Abs Immature Granulocytes 0.09 (H) 0.00 - 0.07 K/uL    Comment: Performed at [facility], [address] [place], [place] [address]  Urinalysis, Routine w reflex microscopic     Status: Abnormal   Collection Time: 11/07/18  4:58 PM  Result Value Ref Range   Color, Urine YELLOW YELLOW   APPearance CLEAR CLEAR   Specific Gravity, Urine 1.010 1.005 - 1.030   pH 5.5 5.0 - 8.0   Glucose, UA >=500 (A) NEGATIVE mg/dL   Hgb urine dipstick NEGATIVE NEGATIVE   Bilirubin Urine NEGATIVE NEGATIVE   Ketones, ur NEGATIVE NEGATIVE mg/dL   Protein, ur NEGATIVE NEGATIVE mg/dL   Nitrite NEGATIVE NEGATIVE    Leukocytes,Ua NEGATIVE NEGATIVE    Comment: Performed at [facility], [address] [place], [place] [address]  Brain natriuretic peptide     Status: None   Collection Time: 11/07/18  4:58 PM  Result Value Ref Range   B Natriuretic Peptide 72.5 0.0 - 100.0 pg/mL    Comment: Performed at [facility], [address] [place], [place] [address]  Lipase, blood     Status: None   Collection Time: 11/07/18  4:58 PM  Result Value Ref Range   Lipase 42 11 - 51 U/L    Comment: Performed at [facility], [address] [place], [place] [address]  Urinalysis, Microscopic (reflex)     Status: Abnormal   Collection Time: 11/07/18  4:58 PM  Result Value Ref Range   RBC / HPF NONE SEEN 0 - 5 RBC/hpf   WBC, UA NONE SEEN 0 - 5 WBC/hpf   Bacteria, UA RARE (A) NONE SEEN   Squamous Epithelial / LPF 0-5 0 - 5    Comment: Performed at [facility], [address] [place], [place] [address]  Troponin I - Once     Status: None   Collection Time: 11/07/18  4:59 PM  Result Value Ref Range   Troponin I <0.03 <0.03 ng/mL    Comment: Performed at [facility], [address] [place], [place] [address]  Ammonia     Status: None   Collection Time: 11/07/18  5:00 PM  Result Value Ref Range   Ammonia 27 9 - 35 umol/L    Comment: Performed at [facility]  [address], [address] [place], [place] [address]  POCT I-Stat EG7     Status: Abnormal   Collection Time: 11/07/18  5:17 PM  Result Value Ref Range   pH, Ven 7.373 7.250 - 7.430   pCO2, Ven 40.6 (L) 44.0 - 60.0 mmHg   pO2, Ven 63.0 (H) 32.0 - 45.0 mmHg   Bicarbonate 23.6 20.0 - 28.0 mmol/L   TCO2 25 22 - 32 mmol/L   O2 Saturation 91.0 %   Acid-base deficit 2.0 0.0 - 2.0 mmol/L   Sodium 131 (L) 135 - 145 mmol/L   Potassium 4.0 3.5 - 5.1 mmol/L   Calcium, Ion 1.09 (L) 1.15 - 1.40 mmol/L   HCT 40.0 39.0 - 52.0 %   Hemoglobin 13.6 13.0 - 17.0 g/dL   Patient temperature HIDE    Sample type  VENOUS   SARS Coronavirus 2 [facility] order, Performed in [place] hospital lab)     Status: None   Collection Time: 11/07/18  6:30 PM  Result Value Ref Range   SARS Coronavirus 2 NEGATIVE NEGATIVE    Comment: (NOTE) If result is NEGATIVE SARS-CoV-2 target nucleic acids are NOT DETECTED. The SARS-CoV-2 RNA is generally detectable in upper and lower  respiratory specimens during the acute phase of infection. The lowest  concentration of SARS-CoV-2 viral copies this assay can detect is 250  copies / mL. A negative result does not preclude SARS-CoV-2 infection  and should not be used as the sole basis for treatment or other  patient management decisions.  A negative result may occur with  improper specimen collection / handling, submission of specimen other  than nasopharyngeal swab, presence of viral mutation(s) within the  areas targeted by this assay, and inadequate number of viral copies  (<250 copies / mL). A negative result must be combined with clinical  observations, patient history, and epidemiological information. If result is POSITIVE SARS-CoV-2 target nucleic acids are DETECTED. The SARS-CoV-2 RNA is generally detectable in upper and lower  respiratory specimens dur ing the acute phase of infection.  Positive  results are indicative of active infection with SARS-CoV-2.  Clinical  correlation with patient history and other diagnostic information is  necessary to determine patient infection status.  Positive results do  not rule out bacterial infection or co-infection with other viruses. If result is PRESUMPTIVE POSTIVE SARS-CoV-2 nucleic acids MAY BE PRESENT.   A presumptive positive result was obtained on the submitted specimen  and confirmed on repeat testing.  While 2019 novel coronavirus  (SARS-CoV-2) nucleic acids may be present in the submitted sample  additional confirmatory testing may be necessary for epidemiological  and / or clinical management purposes  to  differentiate between  SARS-CoV-2 and other Sarbecovirus currently known to infect humans.  If clinically indicated additional testing with an alternate test  methodology [phone]) is advised. The SARS-CoV-2 RNA is generally  detectable in upper and lower respiratory sp ecimens during the acute  phase of infection. The expected result is Negative. Fact Sheet for Patients:  StrictlyIdeas.no Fact Sheet for Healthcare Providers: BankingDealers.co.za This test is not yet approved or cleared by the [place] FDA and has been authorized for detection and/or diagnosis of SARS-CoV-2 by FDA under an Emergency Use Authorization (EUA).  This EUA will remain in effect (meaning this test can be used) for the duration of the COVID-19 declaration under Section 564(b)(1) of the Act, 21 U.S.C. section 360bbb-3(b)(1), unless the authorization is terminated or revoked sooner. Performed at [facility], [place] [address], [place], [place] [address]   Lactic acid, plasma     Status: Abnormal   Collection Time: 11/07/18  6:58 PM  Result Value Ref Range   Lactic Acid, Venous 2.5 (HH) 0.5 - 1.9 mmol/L    Comment: CRITICAL RESULT CALLED TO, READ BACK BY AND VERIFIED WITH: Jaci Carrel RN AT 1943 ON 11/07/18 BY I.[email or web address] Performed at [facility], [address] [place], [place] [address]   CBG monitoring, ED     Status: Abnormal   Collection Time: 11/07/18  8:04 PM  Result Value Ref Range   Glucose-Capillary 407 (H) 70 - 99 mg/dL  POC CBG, ED     Status: Abnormal   Collection Time: 11/07/18 10:51 PM  Result Value Ref Range   Glucose-Capillary 345 (H) 70 - 99 mg/dL  Troponin I - Now Then Q6H     Status: None   Collection Time: 11/07/18 10:57 PM  Result Value Ref Range   Troponin I <0.03 <0.03 ng/mL    Comment: Performed at [facility], [address], [place], [place] [address]   Ct Abdomen Pelvis Wo Contrast  Result  Date: 11/07/2018 CLINICAL DATA:  Fever. Shortness of breath for the past 2 weeks. COVID-19 negative. Abdominal distention. EXAM: CT CHEST, ABDOMEN AND PELVIS WITHOUT CONTRAST TECHNIQUE: Multidetector CT imaging of the chest, abdomen and pelvis was performed following the standard protocol without IV contrast. COMPARISON:  CT chest dated August 11, 2018. CT abdomen dated September 07, 2014. FINDINGS: CT CHEST FINDINGS Cardiovascular: Normal heart size. No pericardial effusion. Unchanged lipomatous hypertrophy of the interatrial septum. No thoracic aortic aneurysm. Coronary, aortic arch, and branch vessel atherosclerotic vascular disease. Normal caliber pulmonary arteries. Mediastinum/Nodes: No enlarged mediastinal, hilar, or axillary lymph nodes. Thyroid gland, trachea, and esophagus demonstrate no significant findings. Lungs/Pleura: New small focal area of peripheral ground-glass density in the anterior right upper lobe. Increased subsegmental atelectasis in the right lower lobe. Postsurgical changes related to prior left lower lobe wedge resection unchanged pleuroparenchymal thickening and architectural distortion in the mid to lower left hemithorax with extensive parenchymal banding. No focal consolidation, pleural effusion, or pneumothorax. Unchanged diffuse bronchial wall thickening with mild centrilobular and paraseptal emphysema. Musculoskeletal: Postsurgical changes related to prior left thoracotomy. No acute or significant osseous findings. CT ABDOMEN PELVIS FINDINGS Hepatobiliary: Unchanged hepatic steatosis. No focal liver abnormality. Prior cholecystectomy. No biliary dilatation. Pancreas: Unremarkable. No pancreatic ductal dilatation or surrounding inflammatory changes. Spleen: Normal in size without focal abnormality. Adrenals/Urinary Tract: The adrenal glands are unremarkable. Prior right nephrectomy. Tiny subcentimeter hyperdense lesion in the posterior midpole of the left kidney, consistent with  small hemorrhagic or proteinaceous cyst. No renal calculi or hydronephrosis. The bladder is unremarkable. Stomach/Bowel: Stomach is within normal limits. Appendix is surgically absent. No evidence of bowel wall thickening, distention, or inflammatory changes. Vascular/Lymphatic: Aortic atherosclerosis. No enlarged abdominal or pelvic lymph nodes. Reproductive: Prostate is unremarkable. Other: Small fat containing right inguinal and paraumbilical hernias. No free fluid or pneumoperitoneum. Musculoskeletal: Bilateral femoral head avascular necrosis. IMPRESSION: Chest: 1. New small focal area of peripheral ground-glass density in the anterior right upper lobe is likely infectious or inflammatory. 2. Unchanged pleuroparenchymal scarring and fibrosis in the mid to lower left hemithorax. 3.  Emphysema (ICD10-J43.9). 4.  Aortic atherosclerosis (ICD10-I70.0). Abdomen and pelvis: 1.  No acute intra-abdominal process. 2. Hepatic steatosis. 3. Prior right nephrectomy. 4. Bilateral femoral head avascular necrosis. Electronically Signed   By: Huntley Dec  Derry M.D.   On: 11/07/2018 23:33   Ct Chest Wo Contrast  Result Date: 11/07/2018 CLINICAL DATA:  Fever. Shortness of breath for the past 2 weeks. COVID-19 negative. Abdominal distention. EXAM: CT CHEST, ABDOMEN AND PELVIS WITHOUT CONTRAST TECHNIQUE: Multidetector CT imaging of the chest, abdomen and pelvis was performed following the standard protocol without IV contrast. COMPARISON:  CT chest dated August 11, 2018. CT abdomen dated September 07, 2014. FINDINGS: CT CHEST FINDINGS Cardiovascular: Normal heart size. No pericardial effusion. Unchanged lipomatous hypertrophy of the interatrial septum. No thoracic aortic aneurysm. Coronary, aortic arch, and branch vessel atherosclerotic vascular disease. Normal caliber pulmonary arteries. Mediastinum/Nodes: No enlarged mediastinal, hilar, or axillary lymph nodes. Thyroid gland, trachea, and esophagus demonstrate no significant  findings. Lungs/Pleura: New small focal area of peripheral ground-glass density in the anterior right upper lobe. Increased subsegmental atelectasis in the right lower lobe. Postsurgical changes related to prior left lower lobe wedge resection unchanged pleuroparenchymal thickening and architectural distortion in the mid to lower left hemithorax with extensive parenchymal banding. No focal consolidation, pleural effusion, or pneumothorax. Unchanged diffuse bronchial wall thickening with mild centrilobular and paraseptal emphysema. Musculoskeletal: Postsurgical changes related to prior left thoracotomy. No acute or significant osseous findings. CT ABDOMEN PELVIS FINDINGS Hepatobiliary: Unchanged hepatic steatosis. No focal liver abnormality. Prior cholecystectomy. No biliary dilatation. Pancreas: Unremarkable. No pancreatic ductal dilatation or surrounding inflammatory changes. Spleen: Normal in size without focal abnormality. Adrenals/Urinary Tract: The adrenal glands are unremarkable. Prior right nephrectomy. Tiny subcentimeter hyperdense lesion in the posterior midpole of the left kidney, consistent with small hemorrhagic or proteinaceous cyst. No renal calculi or hydronephrosis. The bladder is unremarkable. Stomach/Bowel: Stomach is within normal limits. Appendix is surgically absent. No evidence of bowel wall thickening, distention, or inflammatory changes. Vascular/Lymphatic: Aortic atherosclerosis. No enlarged abdominal or pelvic lymph nodes. Reproductive: Prostate is unremarkable. Other: Small fat containing right inguinal and paraumbilical hernias. No free fluid or pneumoperitoneum. Musculoskeletal: Bilateral femoral head avascular necrosis. IMPRESSION: Chest: 1. New small focal area of peripheral ground-glass density in the anterior right upper lobe is likely infectious or inflammatory. 2. Unchanged pleuroparenchymal scarring and fibrosis in the mid to lower left hemithorax. 3.  Emphysema (ICD10-J43.9). 4.   Aortic atherosclerosis (ICD10-I70.0). Abdomen and pelvis: 1.  No acute intra-abdominal process. 2. Hepatic steatosis. 3. Prior right nephrectomy. 4. Bilateral femoral head avascular necrosis. Electronically Signed   By: Titus Dubin M.D.   On: 11/07/2018 23:33   Dg Chest Port 1 View  Result Date: 11/07/2018 CLINICAL DATA:  Short of breath EXAM: PORTABLE CHEST 1 VIEW COMPARISON:  05/30/2018 FINDINGS: Cardiac enlargement. Mild vascular congestion without edema or effusion Chronic pleural and parenchymal scarring left base unchanged. Atherosclerotic aortic arch. IMPRESSION: Chronic scarring in the left lung base unchanged Mild vascular congestion without edema. Electronically Signed   By: Franchot Gallo M.D.   On: 11/07/2018 18:35    Pending Labs Unresulted Labs (From admission, onward)    Start     Ordered   11/07/18 2349  Novel Coronavirus, NAA (hospital order; send-out to ref lab)  (Novel Coronavirus, NAA [facility] Order))  Once,   R    Question Answer Comment  Current symptoms Fever and Shortness of breath   Excluded other viral illnesses Yes   Exposure Risk None      11/07/18 2348   11/07/18 2230  Troponin I - Now Then Q6H  Now then every 6 hours,   R     11/07/18 2229   11/07/18 1745  Urine  culture  ONCE - STAT,   STAT     11/07/18 1744   11/07/18 1745  HIV antibody (Routine Testing)  Once,   R     11/07/18 1745   11/07/18 1658  Blood Culture (routine x 2)  BLOOD CULTURE X 2,   STAT     11/07/18 1658          Vitals/Pain Today's Vitals   11/07/18 2200 11/07/18 2230 11/07/18 2330 11/08/18 0000  BP: (!) 147/81 132/75 132/72 133/72  Pulse: 87 83 83 81  Resp: 20 (!) 21 19 20   Temp:      TempSrc:      SpO2: 98% 96% 100% 100%  Weight:      Height:      PainSc:        Isolation Precautions Droplet and Contact precautions  Medications Medications  0.9 %  sodium chloride infusion (has no administration in time range)  cefTRIAXone (ROCEPHIN) 1 g in sodium chloride 0.9 %  100 mL IVPB (has no administration in time range)  azithromycin (ZITHROMAX) 500 mg in sodium chloride 0.9 % 250 mL IVPB (has no administration in time range)  azithromycin (ZITHROMAX) 500 MG injection (has no administration in time range)  sodium chloride 0.9 % bolus 500 mL ( Intravenous Stopped 11/07/18 1903)  insulin aspart protamine- aspart (NOVOLOG MIX 70/30) injection 10 Units (10 Units Subcutaneous Given 11/07/18 1813)  sodium chloride 0.9 % bolus 500 mL (0 mLs Intravenous Stopped 11/07/18 2228)    Mobility walks High fall risk   Focused Assessments Pulmonary Assessment Handoff:  Lung sounds: Bilateral Breath Sounds: Diminished L Breath Sounds: Diminished R Breath Sounds: Diminished O2 Device: Nasal Cannula O2 Flow Rate (L/min): 6 L/min      R Recommendations: See Admitting Provider Note  Report given to:   Additional Notes:

## 2018-11-08 NOTE — ED Notes (Signed)
[facility] notified (Taryn) - patient ready for transport 

## 2018-11-08 NOTE — Progress Notes (Signed)
Pt's BP 87/62 HR 76. Pt asymptomatic. Held Cardizem and Imdur. Dr. Lonny Prude made aware. Will reassess and give medication when appropriate.

## 2018-11-08 NOTE — Progress Notes (Addendum)
Patient seen and examined at bedside, patient admitted after midnight, please see earlier detailed admission note by Toy Baker, MD. Briefly, patient presented with shortness of breath concerning for COVID-19 infection. On regular oxygen requirement. Substernal chest pain today with right sided radiation. ?unstable angina. History of CAD with LAD stent. Troponin negative. No new EKG changes. Will give nitro prn. Continue telemetry. If EKG changes, will consult cardiology.   Cordelia Poche, MD [facility] 11/08/2018, 1:29 PM

## 2018-11-08 NOTE — H&P (Signed)
History and Physical    [name] IWP:[id number] DOB: 05-Sep-1955 DOA: 11/07/2018  PCP: Camille Bal, PA-C Patient coming from: [facility]  HPI: [name] is a 63 y.o. male with medical history significant of COPD on 6 L home oxygen, CAD status post PCI, hypertension, hyperlipidemia, hepatitis, GERD, CVA, renal cell carcinoma status post radical right nephrectomy in August 2015, OSA, type 2 diabetes presenting as a transfer from [place] ED for evaluation of shortness of breath, cough, hyperglycemia, and AKI.  COVID-19 in-house test negative, however, questionable findings on chest CT. Patient states he has been feeling short of breath for the past 2 weeks.  He has also been wheezing and having a dry cough.  Reports orthopnea.  Denies lower extremity edema.  Denies any fevers, chills, sick contacts, recent travel, or exposure to any individual with confirmed COVID-19.  States his abdomen has been distended for the past 1 year.  States he had diarrhea for 2 weeks which resolved 3 days ago.  Denies any abdominal pain, nausea, or vomiting.  Reports having prostate problems and chronic dysuria, urinary frequency, and urgency.  States his blood glucose is always high and runs anywhere between 300s to 700s.  States his PCP increased the dose of Lantus 2 months ago.  He is currently using Lantus 110 units twice daily in addition to sliding scale insulin with meals.  Reports compliance with insulin.  Patient reports having chronic chest pain which can occur with exertion or at rest.  States the chest pain is usually substernal and radiates to the right side of his chest and right arm.  Each episode resolves after he takes sublingual nitroglycerin.  States his last episode of chest pain was 3 days ago.  Denies having any chest pain since then.  Review of Systems: As per HPI otherwise 10 point review of systems negative.  Past Medical History:  Diagnosis Date   Anxiety     Arthritis    Cholecystitis, acute 12/20/2013   Lap chole 6/5   Cocaine abuse ([name])    COPD (chronic obstructive pulmonary disease) ([name])    Coronary atherosclerosis of native coronary artery    a. 03/09/2013 Cath/PCI: LM nl, LAD: 50p, 30m (2.5x16 promus DES), LCX nl, OM1 min irregs, LPL/LPDA diff dzs, RCA nondom, mod diff dzs, EF 55%.   Depression    Essential hypertension, benign    GERD (gastroesophageal reflux disease)    Headache    Hepatitis Late 1970s   History of nephrolithiasis    History of pneumonia    History of stroke    Right MCA distribution, residual left-sided weakness   Hyperlipidemia    Noncompliance    Renal cell carcinoma ([place])    Status post radical right nephrectomy August 2015   Sleep apnea    On CPAP, 4L O2 no cpap at home yet   Type 2 diabetes mellitus ([place]) 2011    Past Surgical History:  Procedure Laterality Date   APPENDECTOMY  1970's   CHOLECYSTECTOMY N/A 12/22/2013   Procedure: LAPAROSCOPIC CHOLECYSTECTOMY ;  Surgeon: [name], MD;  Location: [place];  Service: General;  Laterality: N/A;   ENDARTERECTOMY Right 10/08/2014   Procedure: Right ENDARTERECTOMY CAROTID;  Surgeon: [name], MD;  Location: [place];  Service: Vascular;  Laterality: Right;   LAPAROSCOPIC LYSIS OF ADHESIONS  02/21/2014   Procedure: LAPAROSCOPIC LYSIS OF ADHESIONS EXTINSIVE;  Surgeon: [name], MD;  Location: [facility];  Service: Urology;;   LEFT HEART CATHETERIZATION WITH CORONARY ANGIOGRAM N/A 06/02/2012   Procedure: LEFT HEART CATHETERIZATION WITH CORONARY ANGIOGRAM;  Surgeon: [name], MD;  Location: [facility] CATH LAB;  Service: Cardiovascular;  Laterality: N/A;   LEFT HEART CATHETERIZATION WITH CORONARY ANGIOGRAM N/A 03/09/2013   Procedure: LEFT HEART CATHETERIZATION WITH CORONARY ANGIOGRAM;  Surgeon: [name], MD;  Location: [facility] CATH LAB;  Service: Cardiovascular;  Laterality: N/A;   LEFT HEART CATHETERIZATION WITH CORONARY ANGIOGRAM  N/A 12/20/2013   Procedure: LEFT HEART CATHETERIZATION WITH CORONARY ANGIOGRAM;  Surgeon: [name], MD;  Location: [facility] CATH LAB;  Service: Cardiovascular;  Laterality: N/A;   LUNG BIOPSY Left 05/18/2014   Procedure: LUNG BIOPSY left upper lobe & left lower lobe;  Surgeon: [name], MD;  Location: [place];  Service: Thoracic;  Laterality: Left;   PATCH ANGIOPLASTY Right 10/08/2014   Procedure: PATCH ANGIOPLASTY Right Carotid;  Surgeon: [name], MD;  Location: [place];  Service: Vascular;  Laterality: Right;   ROBOT ASSISTED LAPAROSCOPIC NEPHRECTOMY Right 02/21/2014   Procedure: ROBOTIC ASSISTED LAPAROSCOPIC RIGHT NEPHRECTOMY ;  Surgeon: [name], MD;  Location: [facility];  Service: Urology;  Laterality: Right;   VIDEO ASSISTED THORACOSCOPY Left 05/18/2014   Procedure: LEFT VIDEO ASSISTED THORACOSCOPY;  Surgeon: [name], MD;  Location: [place];  Service: Thoracic;  Laterality: Left;   VIDEO BRONCHOSCOPY  05/18/2014   Procedure: VIDEO BRONCHOSCOPY;  Surgeon: [name], MD;  Location: [place];  Service: Thoracic;;     reports that he has been smoking cigarettes. He started smoking about 46 years ago. He has a 41.00 pack-year smoking history. He has never used smokeless tobacco. He reports that he does not drink alcohol or use drugs.  Allergies  Allergen Reactions   Oxycodone Itching and Nausea Only    Pt is able to tolerate if taken with benadryl    Family History  Problem Relation Age of Onset   Cancer Mother        Thyroid - living in her 78's.   Hypertension Other    Diabetes Other    Stroke Other    Lung cancer Father    CAD Father    Cancer Maternal Grandmother        Breast   Cancer Maternal Grandfather        Throat and stomach   CAD Brother     Prior to Admission medications   Medication Sig Start Date End Date Taking? Authorizing Provider  ALPRAZolam Duanne Moron) 0.5 MG tablet Take 1 tablet (0.5 mg total) by mouth 3  (three) times daily as needed for anxiety. 06/03/18   Dhungel, Flonnie Overman, MD  atorvastatin (LIPITOR) 20 MG tablet Take 20 mg by mouth at bedtime. 04/26/18   [provider]  clopidogrel (PLAVIX) 75 MG tablet Take 1 tablet (75 mg total) by mouth daily. 02/01/18   Satira Sark, MD  diltiazem (CARDIZEM CD) 240 MG 24 hr capsule Take 240 mg by mouth daily.    [provider]  docusate sodium (COLACE) 100 MG capsule Take 100 mg by mouth at bedtime.    [provider]  FLUoxetine (PROZAC) 10 MG capsule Take 10 mg by mouth every morning. Take with 40 mg capsule = 50 mg total 03/01/18   [provider]  FLUoxetine (PROZAC) 40 MG capsule Take 40 mg by mouth daily. Take with 10 mg capsule = 50 mg total    [provider]  gabapentin (NEURONTIN) 300  MG capsule Take 1 capsule (300 mg total) by mouth 3 (three) times daily. 06/03/18   Dhungel, Flonnie Overman, MD  Glycopyrrolate-Formoterol (BEVESPI AEROSPHERE) 9-4.8 MCG/ACT AERO Inhale 2 puffs into the lungs 2 (two) times daily. 03/30/18   Brand Males, MD  insulin aspart (NOVOLOG) 100 UNIT/ML injection Inject into the skin 3 (three) times daily before meals.    [provider]  isosorbide mononitrate (IMDUR) 60 MG 24 hr tablet Take 1 tablet (60 mg total) by mouth daily. 02/01/18   Satira Sark, MD  LANTUS SOLOSTAR 100 UNIT/ML Solostar Pen Inject 120 Units into the skin 2 (two) times daily.  05/25/18   [provider]  linagliptin (TRADJENTA) 5 MG TABS tablet Take 5 mg by mouth daily.    [provider]  LORazepam (ATIVAN) 1 MG tablet Take 5 mg by mouth at bedtime.    [provider]  nicotine (NICODERM CQ - DOSED IN MG/24 HOURS) 14 mg/24hr patch Place 1 patch (14 mg total) onto the skin daily. 06/04/18   Dhungel, Flonnie Overman, MD  nitroGLYCERIN (NITROSTAT) 0.4 MG SL tablet PLACE ONE TABLET UNDER THE TONGUE EVERY FIVE MINUTES AS NEEDED FOR CHEST PAIN. IF 3 TABLETS ARE NECESSARY CALL 911  10/18/18   Satira Sark, MD  oxybutynin (DITROPAN-XL) 10 MG 24 hr tablet Take 10 mg by mouth daily. 02/22/18   [provider]  oxyCODONE-acetaminophen (PERCOCET) 10-325 MG tablet Take 1 tablet by mouth every 6 (six) hours as needed for pain. 06/03/18   Dhungel, Nishant, MD  pantoprazole (PROTONIX) 40 MG tablet Take 40 mg by mouth daily.    [provider]  potassium chloride SA (K-DUR,KLOR-CON) 20 MEQ tablet Take 20 mEq by mouth 3 (three) times daily.    [provider]  Probiotic Product (PROBIOTIC PO) Take 1 capsule by mouth daily.    [provider]  tamsulosin (FLOMAX) 0.4 MG CAPS capsule Take 0.4 mg by mouth daily.     [provider]  torsemide (DEMADEX) 20 MG tablet TAKE THREE TABLETS BY MOUTH TWICE DAILY 06/24/18   Satira Sark, MD    Physical Exam: Vitals:   11/08/18 0200 11/08/18 0302 11/08/18 0311 11/08/18 0508  BP: 129/80  121/75 (!) 89/64  Pulse: 82  76 80  Resp: 19  18 18   Temp:   97.9 F (36.6 C) 98.2 F (36.8 C)  TempSrc:   Oral Oral  SpO2: 90%  96% 93%  Weight:  106.7 kg    Height:  5\' 8"  (1.727 m)      Physical Exam  Constitutional: He is oriented to person, place, and time. He appears well-developed and well-nourished. No distress.  HENT:  Head: Normocephalic.  Dry mucous membranes  Eyes: Right eye exhibits no discharge. Left eye exhibits no discharge.  Neck: Neck supple. No JVD present.  Cardiovascular: Normal rate, regular rhythm and intact distal pulses.  Pulmonary/Chest: Effort normal.  On 6 L supplemental oxygen (same as home requirement) No increased work of breathing Speaking clearly in full sentences Diffuse rhonchi appreciated on auscultation  Abdominal: Soft. Bowel sounds are normal. He exhibits distension. There is abdominal tenderness. There is guarding. There is no rebound.  Left lower quadrant tender to palpation with guarding  Musculoskeletal:        General: No edema.  Neurological: He  is alert and oriented to person, place, and time.  Skin: Skin is warm and dry. He is not diaphoretic.     Labs on Admission: I have personally  reviewed following labs and imaging studies  CBC: Recent Labs  Lab 11/07/18 1658 11/07/18 1717  WBC 9.2  --   NEUTROABS 7.1  --   HGB 13.2 13.6  HCT 40.3 40.0  MCV 83.3  --   PLT 197  --    Basic Metabolic Panel: Recent Labs  Lab 11/07/18 1658 11/07/18 1717  NA 129* 131*  K 3.7 4.0  CL 93*  --   CO2 22  --   GLUCOSE 539*  --   BUN 20  --   CREATININE 1.91*  --   CALCIUM 8.5*  --    GFR: Estimated Creatinine Clearance: 46.9 mL/min (A) (by C-G formula based on SCr of 1.91 mg/dL (H)). Liver Function Tests: Recent Labs  Lab 11/07/18 1658  AST 15  ALT 12  ALKPHOS 103  BILITOT 0.5  PROT 7.1  ALBUMIN 3.5   Recent Labs  Lab 11/07/18 1658  LIPASE 42   Recent Labs  Lab 11/07/18 1700  AMMONIA 27   Coagulation Profile: No results for input(s): INR, PROTIME in the last 168 hours. Cardiac Enzymes: Recent Labs  Lab 11/07/18 1659 11/07/18 2257  TROPONINI <0.03 <0.03   BNP (last 3 results) No results for input(s): PROBNP in the last 8760 hours. HbA1C: No results for input(s): HGBA1C in the last 72 hours. CBG: Recent Labs  Lab 11/07/18 2004 11/07/18 2251 11/08/18 0504  GLUCAP 407* 345* 341*   Lipid Profile: No results for input(s): CHOL, HDL, LDLCALC, TRIG, CHOLHDL, LDLDIRECT in the last 72 hours. Thyroid Function Tests: No results for input(s): TSH, T4TOTAL, FREET4, T3FREE, THYROIDAB in the last 72 hours. Anemia Panel: No results for input(s): VITAMINB12, FOLATE, FERRITIN, TIBC, IRON, RETICCTPCT in the last 72 hours. Urine analysis:    Component Value Date/Time   COLORURINE YELLOW 11/07/2018 [place] 11/07/2018 1658   LABSPEC 1.010 11/07/2018 1658   PHURINE 5.5 11/07/2018 1658   GLUCOSEU >=500 (A) 11/07/2018 1658   HGBUR NEGATIVE 11/07/2018 1658   BILIRUBINUR NEGATIVE 11/07/2018 1658     KETONESUR NEGATIVE 11/07/2018 1658   PROTEINUR NEGATIVE 11/07/2018 1658   UROBILINOGEN 0.2 02/28/2015 1310   NITRITE NEGATIVE 11/07/2018 1658   LEUKOCYTESUR NEGATIVE 11/07/2018 1658    Radiological Exams on Admission: Ct Abdomen Pelvis Wo Contrast  Result Date: 11/07/2018 CLINICAL DATA:  Fever. Shortness of breath for the past 2 weeks. COVID-19 negative. Abdominal distention. EXAM: CT CHEST, ABDOMEN AND PELVIS WITHOUT CONTRAST TECHNIQUE: Multidetector CT imaging of the chest, abdomen and pelvis was performed following the standard protocol without IV contrast. COMPARISON:  CT chest dated August 11, 2018. CT abdomen dated September 07, 2014. FINDINGS: CT CHEST FINDINGS Cardiovascular: Normal heart size. No pericardial effusion. Unchanged lipomatous hypertrophy of the interatrial septum. No thoracic aortic aneurysm. Coronary, aortic arch, and branch vessel atherosclerotic vascular disease. Normal caliber pulmonary arteries. Mediastinum/Nodes: No enlarged mediastinal, hilar, or axillary lymph nodes. Thyroid gland, trachea, and esophagus demonstrate no significant findings. Lungs/Pleura: New small focal area of peripheral ground-glass density in the anterior right upper lobe. Increased subsegmental atelectasis in the right lower lobe. Postsurgical changes related to prior left lower lobe wedge resection unchanged pleuroparenchymal thickening and architectural distortion in the mid to lower left hemithorax with extensive parenchymal banding. No focal consolidation, pleural effusion, or pneumothorax. Unchanged diffuse bronchial wall thickening with mild centrilobular and paraseptal emphysema. Musculoskeletal: Postsurgical changes related to prior left thoracotomy. No acute or significant osseous findings. CT ABDOMEN PELVIS FINDINGS Hepatobiliary: Unchanged hepatic steatosis. No focal liver  abnormality. Prior cholecystectomy. No biliary dilatation. Pancreas: Unremarkable. No pancreatic ductal dilatation or  surrounding inflammatory changes. Spleen: Normal in size without focal abnormality. Adrenals/Urinary Tract: The adrenal glands are unremarkable. Prior right nephrectomy. Tiny subcentimeter hyperdense lesion in the posterior midpole of the left kidney, consistent with small hemorrhagic or proteinaceous cyst. No renal calculi or hydronephrosis. The bladder is unremarkable. Stomach/Bowel: Stomach is within normal limits. Appendix is surgically absent. No evidence of bowel wall thickening, distention, or inflammatory changes. Vascular/Lymphatic: Aortic atherosclerosis. No enlarged abdominal or pelvic lymph nodes. Reproductive: Prostate is unremarkable. Other: Small fat containing right inguinal and paraumbilical hernias. No free fluid or pneumoperitoneum. Musculoskeletal: Bilateral femoral head avascular necrosis. IMPRESSION: Chest: 1. New small focal area of peripheral ground-glass density in the anterior right upper lobe is likely infectious or inflammatory. 2. Unchanged pleuroparenchymal scarring and fibrosis in the mid to lower left hemithorax. 3.  Emphysema (ICD10-J43.9). 4.  Aortic atherosclerosis (ICD10-I70.0). Abdomen and pelvis: 1.  No acute intra-abdominal process. 2. Hepatic steatosis. 3. Prior right nephrectomy. 4. Bilateral femoral head avascular necrosis. Electronically Signed   By: Titus Dubin M.D.   On: 11/07/2018 23:33   Ct Chest Wo Contrast  Result Date: 11/07/2018 CLINICAL DATA:  Fever. Shortness of breath for the past 2 weeks. COVID-19 negative. Abdominal distention. EXAM: CT CHEST, ABDOMEN AND PELVIS WITHOUT CONTRAST TECHNIQUE: Multidetector CT imaging of the chest, abdomen and pelvis was performed following the standard protocol without IV contrast. COMPARISON:  CT chest dated August 11, 2018. CT abdomen dated September 07, 2014. FINDINGS: CT CHEST FINDINGS Cardiovascular: Normal heart size. No pericardial effusion. Unchanged lipomatous hypertrophy of the interatrial septum. No thoracic  aortic aneurysm. Coronary, aortic arch, and branch vessel atherosclerotic vascular disease. Normal caliber pulmonary arteries. Mediastinum/Nodes: No enlarged mediastinal, hilar, or axillary lymph nodes. Thyroid gland, trachea, and esophagus demonstrate no significant findings. Lungs/Pleura: New small focal area of peripheral ground-glass density in the anterior right upper lobe. Increased subsegmental atelectasis in the right lower lobe. Postsurgical changes related to prior left lower lobe wedge resection unchanged pleuroparenchymal thickening and architectural distortion in the mid to lower left hemithorax with extensive parenchymal banding. No focal consolidation, pleural effusion, or pneumothorax. Unchanged diffuse bronchial wall thickening with mild centrilobular and paraseptal emphysema. Musculoskeletal: Postsurgical changes related to prior left thoracotomy. No acute or significant osseous findings. CT ABDOMEN PELVIS FINDINGS Hepatobiliary: Unchanged hepatic steatosis. No focal liver abnormality. Prior cholecystectomy. No biliary dilatation. Pancreas: Unremarkable. No pancreatic ductal dilatation or surrounding inflammatory changes. Spleen: Normal in size without focal abnormality. Adrenals/Urinary Tract: The adrenal glands are unremarkable. Prior right nephrectomy. Tiny subcentimeter hyperdense lesion in the posterior midpole of the left kidney, consistent with small hemorrhagic or proteinaceous cyst. No renal calculi or hydronephrosis. The bladder is unremarkable. Stomach/Bowel: Stomach is within normal limits. Appendix is surgically absent. No evidence of bowel wall thickening, distention, or inflammatory changes. Vascular/Lymphatic: Aortic atherosclerosis. No enlarged abdominal or pelvic lymph nodes. Reproductive: Prostate is unremarkable. Other: Small fat containing right inguinal and paraumbilical hernias. No free fluid or pneumoperitoneum. Musculoskeletal: Bilateral femoral head avascular necrosis.  IMPRESSION: Chest: 1. New small focal area of peripheral ground-glass density in the anterior right upper lobe is likely infectious or inflammatory. 2. Unchanged pleuroparenchymal scarring and fibrosis in the mid to lower left hemithorax. 3.  Emphysema (ICD10-J43.9). 4.  Aortic atherosclerosis (ICD10-I70.0). Abdomen and pelvis: 1.  No acute intra-abdominal process. 2. Hepatic steatosis. 3. Prior right nephrectomy. 4. Bilateral femoral head avascular necrosis. Electronically Signed   By: Titus Dubin  M.D.   On: 11/07/2018 23:33   Dg Chest Port 1 View  Result Date: 11/07/2018 CLINICAL DATA:  Short of breath EXAM: PORTABLE CHEST 1 VIEW COMPARISON:  05/30/2018 FINDINGS: Cardiac enlargement. Mild vascular congestion without edema or effusion Chronic pleural and parenchymal scarring left base unchanged. Atherosclerotic aortic arch. IMPRESSION: Chronic scarring in the left lung base unchanged Mild vascular congestion without edema. Electronically Signed   By: Franchot Gallo M.D.   On: 11/07/2018 18:35    EKG: Independently reviewed.  Sinus or ectopic atrial rhythm, PACs.  Assessment/Plan Principal Problem:   Dyspnea Active Problems:   UTI symptoms   Abdominal distention   Chest pain   Hyperglycemia   AKI (acute kidney injury) ([name])   Dyspnea, ?CAP, concern for COVID-19 -Patient is presenting with complaints of worsening shortness of breath, cough, and wheezing.  Has history of COPD but no wheezing appreciated.  Has diffuse rhonchi on exam.  Currently satting well on 6 L supplemental oxygen (same as home requirement).  No increased work of breathing. -Afebrile.  Hemodynamically stable.  No leukocytosis.  No lymphopenia.  Lactic acid 3.9 >2.5 after 1 L IV fluid bolus in the ED. No sepsis physiology.  Lactic acid possibly elevated due to dehydration.  BNP normal.  SARS-CoV-2 testing negative.  However, chest CT with concerning finding. Showing new small focal area of peripheral groundglass density in  the anterior right upper lobe, likely infectious versus inflammatory.  -Received ceftriaxone and azithromycin in the ED.  Will continue antibiotics at this time, although low suspicion for bacterial pneumonia given no leukocytosis or focal consolidation on CT.  Check procalcitonin level.  Discontinue antibiotics if procalcitonin negative. -Repeat SARS-CoV-2 test pending -Check d-dimer, fibrinogen, ferritin, CRP -Continue supplemental oxygen -Droplet and contact precautions -Self proning -Combivent inhaler every 6 hours, albuterol MDI as needed -Blood culture x2 pending  UTI symptoms Patient endorsing chronic dysuria, urinary frequency, and urgency.  UA not suggestive of infection.  Afebrile and no leukocytosis.  Suspect some of his symptoms could be related to BPH.  Flomax is listed in home medications but it is unclear whether it is a current medication.  Pharmacy medication reconciliation pending. -Urine culture pending  Abdominal distention Patient reporting 1 year history of abdominal distention.  Noted to have significant abdominal distention.  Left lower quadrant tender to palpation with guarding.  Lipase and LFTs normal.  CT abdomen negative for acute finding.  Showing unchanged hepatic steatosis and no focal liver abnormality.  No evidence of bowel thickening, distention, or inflammatory changes. -Continue to monitor  Chest pain, history of CAD status post PCI Patient reports chronic chest pain, most recent episode 3 days ago.  Chest pain with both typical and atypical features.  Possibly anginal as each episode is relieved after he takes sublingual nitroglycerin.  No chest pain at present.  Troponin x2 negative.  EKG not suggestive of ACS.  Appears comfortable on exam and no signs of distress.  Per review of cardiology note from September 07, 2018, patient has chronic angina and cardiac catheterization in 2015 demonstrated patent LAD stent site and moderate nonobstructive disease in the  circumflex and RCA. -Cardiac monitoring -Continue Plavix, statin -Continue Cardizem CD and Imdur for angina -Discuss with cardiology in a.m.  Hyperglycemia, uncontrolled insulin-dependent type 2 diabetes Blood glucose 539 on arrival to the ED.  No signs of DKA-bicarb and anion gap normal.  No ketonuria.  Received 10 units of NovoLog 70/30 in the ED.  Repeat CBG 345. -Check A1c -Continue  home Lantus 110 units twice daily -Sliding scale insulin moderate with bedtime coverage -IV fluid hydration  AKI on CKD 3, history of prior right nephrectomy Possibly prerenal due to dehydration in the setting of recent diarrheal illness (now resolved).  Creatinine 1.9, baseline 1.2-1.4.  CT showing tiny subcentimeter hyperdense lesion in the posterior midpole of the left kidney, consistent with small hemorrhagic or proteinaceous cyst.  No renal calculi or hydronephrosis. -IV fluid hydration -Avoid nephrotoxic agents -Continue to monitor BMP  Unable to safely order remainder of home medications at this time as pharmacy medication reconciliation is pending.  DVT prophylaxis: Lovenox Code Status: Patient wishes to be DNR. Family Communication: No family available. Disposition Plan: Anticipate discharge after clinical improvement. Consults called: None Admission status: Observation, telemetry  This chart was dictated using voice recognition software.  Despite best efforts to proofread, errors can occur which can change the documentation meaning.  Shela Leff MD [facility] Pager [phone]  If 7PM-7AM, please contact night-coverage [email or web address] Password [email or web address]  11/08/2018, 5:25 AM

## 2018-11-09 DIAGNOSIS — R0600 Dyspnea, unspecified: Secondary | ICD-10-CM | POA: Diagnosis not present

## 2018-11-09 LAB — CBC WITH DIFFERENTIAL/PLATELET
Abs Immature Granulocytes: 0.08 10*3/uL — ABNORMAL HIGH (ref 0.00–0.07)
Basophils Absolute: 0 10*3/uL (ref 0.0–0.1)
Basophils Relative: 0 %
Eosinophils Absolute: 0.1 10*3/uL (ref 0.0–0.5)
Eosinophils Relative: 1 %
HCT: 40.1 % (ref 39.0–52.0)
Hemoglobin: 13 g/dL (ref 13.0–17.0)
Immature Granulocytes: 1 %
Lymphocytes Relative: 14 %
Lymphs Abs: 1.1 10*3/uL (ref 0.7–4.0)
MCH: 27.7 pg (ref 26.0–34.0)
MCHC: 32.4 g/dL (ref 30.0–36.0)
MCV: 85.3 fL (ref 80.0–100.0)
Monocytes Absolute: 0.5 10*3/uL (ref 0.1–1.0)
Monocytes Relative: 6 %
Neutro Abs: 6.3 10*3/uL (ref 1.7–7.7)
Neutrophils Relative %: 78 %
Platelets: 173 10*3/uL (ref 150–400)
RBC: 4.7 MIL/uL (ref 4.22–5.81)
RDW: 13.3 % (ref 11.5–15.5)
WBC: 8 10*3/uL (ref 4.0–10.5)
nRBC: 0 % (ref 0.0–0.2)

## 2018-11-09 LAB — COMPREHENSIVE METABOLIC PANEL
ALT: 11 U/L (ref 0–44)
AST: 11 U/L — ABNORMAL LOW (ref 15–41)
Albumin: 2.9 g/dL — ABNORMAL LOW (ref 3.5–5.0)
Alkaline Phosphatase: 78 U/L (ref 38–126)
Anion gap: 8 (ref 5–15)
BUN: 11 mg/dL (ref 8–23)
CO2: 23 mmol/L (ref 22–32)
Calcium: 8.7 mg/dL — ABNORMAL LOW (ref 8.9–10.3)
Chloride: 104 mmol/L (ref 98–111)
Creatinine, Ser: 1.14 mg/dL (ref 0.61–1.24)
GFR calc Af Amer: >60 mL/min (ref >60)
GFR calc non Af Amer: >60 mL/min (ref >60)
Glucose, Bld: 278 mg/dL — ABNORMAL HIGH (ref 70–99)
Potassium: 4.1 mmol/L (ref 3.5–5.1)
Sodium: 135 mmol/L (ref 135–145)
Total Bilirubin: 0.5 mg/dL (ref 0.3–1.2)
Total Protein: 6.5 g/dL (ref 6.5–8.1)

## 2018-11-09 LAB — LACTIC ACID, PLASMA: Lactic Acid, Venous: 1.4 mmol/L (ref 0.5–1.9)

## 2018-11-09 LAB — NOVEL CORONAVIRUS, NAA (HOSP ORDER, SEND-OUT TO REF LAB; TAT 18-24 HRS): SARS-CoV-2, NAA: NOT DETECTED

## 2018-11-09 LAB — FERRITIN: Ferritin: 49 ng/mL (ref 24–336)

## 2018-11-09 LAB — GLUCOSE, CAPILLARY
Glucose-Capillary: 259 mg/dL — ABNORMAL HIGH (ref 70–99)
Glucose-Capillary: 318 mg/dL — ABNORMAL HIGH (ref 70–99)

## 2018-11-09 MED ORDER — SODIUM CHLORIDE 0.9 % IV SOLN
2.0000 g | INTRAVENOUS | Status: DC
  Filled 2018-11-09: qty 20

## 2018-11-09 MED ORDER — AZITHROMYCIN 250 MG PO TABS: 500.0000 mg | ORAL_TABLET | Freq: Every day | ORAL | Status: DC

## 2018-11-09 NOTE — Progress Notes (Signed)
Pt discharged home today per Dr. Verlon Au. Pt's IV site D/C'd and WDL. Pt's VSS. Pt provided with home medication list and discharge instructions.. Verbalized understanding. Pt left floor via WC in stable condition accompanied by NT.

## 2018-11-09 NOTE — Progress Notes (Signed)
PHARMACIST - PHYSICIAN COMMUNICATION DR:   Verlon Au CONCERNING: Antibiotic IV to Oral Route Change Policy  RECOMMENDATION: This patient is receiving azithromycin by the intravenous route.  Based on criteria approved by the Pharmacy and Therapeutics Committee, the antibiotic(s) is/are being converted to the equivalent oral dose form(s).   DESCRIPTION: These criteria include:  Patient being treated for a respiratory tract infection, urinary tract infection, cellulitis or clostridium difficile associated diarrhea if on metronidazole  The patient is not neutropenic and does not exhibit a GI malabsorption state  The patient is eating (either orally or via tube) and/or has been taking other orally administered medications for a least 24 hours  The patient is improving clinically and has a Tmax < 100.5  If you have questions about this conversion, please contact the Pharmacy Department  []   [phone] )  [facility] []   [phone] )  [facility] []   [phone] )  [facility] []   [phone] )  [facility] [x]   [phone] )  [place], PharmD, BCPS 11/09/2018 9:03 AM

## 2018-11-09 NOTE — Discharge Summary (Signed)
Physician Discharge Summary  [name]:[id number] DOB: May 05, 1956 DOA: 11/07/2018  PCP: Camille Bal, PA-C  Admit date: 11/07/2018 Discharge date: 11/09/2018  Time spent: 20 minutes  Recommendations for Outpatient Follow-up:  1. Follow up with PCP  Discharge Diagnoses:  Principal Problem:   Dyspnea Active Problems:   UTI symptoms   Abdominal distention   Chest pain   Hyperglycemia   AKI (acute kidney injury) [facility])   Discharge Condition: improved  Diet recommendation: dm htn  Filed Weights   11/07/18 1641 11/08/18 0302  Weight: 104.3 kg 106.7 kg    History of present illness:  63 year old male extensive past medical history CAD status post PCI HTN HLD Reflux CVA Renal cell CA status post nephrectomy 02/2014 OSA Diabetes mellitus type 2  At baseline he is on oxygen 6 L--he came in [facility] [place] shortness of breath past 2 weeks with orthopnea has been having prostate problems and blood sugars have been high  His sugars per him are between the 300s   He was brought in as a COVID rule out and was noted to have lactic acid 3.9 with no sepsis physiology CT showed small area of peripheral groundglass density in   He received ceftriaxone and azithromycin in addition  PCT was neg--he felt fine and stated was normal for him to be SOB and having some CP No fever   He was felt to have clincially stabilized  COVID -19 testing x 2 was neg  He was sent home to have pcp adust 1] Cardizem 2] counsel re: DM ty 2    Discharge Exam: Vitals:   11/09/18 0639 11/09/18 0927  BP: 95/66 103/76  Pulse: 69 78  Resp: 20   Temp: 98.1 F (36.7 C)   SpO2:      General: awake alert pleasant in nad Cardiovascular: s1 s 2no m/r/g Respiratory: cta b  abd soft nt nd no rebound Neuro intact no le edema  Discharge Instructions   Discharge Instructions    Diet - low sodium heart healthy   Complete by:  As directed    Discharge instructions    Complete by:  As directed    Continue all meds Watch sugary foods--your insulin requirements ARE PRETTY HIGH FOLLOW UP WITH YOUR PRIMARY md FOR DOSAGE ADJUSTMENT SOF CARDIZEM STAY SAFE AND GOD BLESS   Increase activity slowly   Complete by:  As directed      Allergies as of 11/09/2018      Reactions   Oxycodone Itching, Nausea Only   Pt is able to tolerate if taken with benadryl      Medication List    TAKE these medications   ALPRAZolam 0.5 MG tablet Commonly known as:  XANAX Take 1 tablet (0.5 mg total) by mouth 3 (three) times daily as needed for anxiety.   atorvastatin 20 MG tablet Commonly known as:  LIPITOR Take 20 mg by mouth at bedtime.   clopidogrel 75 MG tablet Commonly known as:  PLAVIX Take 1 tablet (75 mg total) by mouth daily.   diltiazem 240 MG 24 hr capsule Commonly known as:  CARDIZEM CD Take 240 mg by mouth daily.   docusate sodium 100 MG capsule Commonly known as:  COLACE Take 100 mg by mouth at bedtime.   FLUoxetine 40 MG capsule Commonly known as:  PROZAC Take 40 mg by mouth daily. Take with 10 mg capsule = 50 mg total   FLUoxetine 10 MG capsule Commonly known as:  PROZAC Take 10 mg by mouth every morning. Take with 40 mg capsule = 50 mg total   gabapentin 300 MG capsule Commonly known as:  NEURONTIN Take 1 capsule (300 mg total) by mouth 3 (three) times daily. What changed:  how much to take   Glycopyrrolate-Formoterol 9-4.8 MCG/ACT Aero Commonly known as:  Bevespi Aerosphere Inhale 2 puffs into the lungs 2 (two) times daily.   ibuprofen 200 MG tablet Commonly known as:  ADVIL Take 400 mg by mouth every 6 (six) hours as needed for fever, headache or mild pain.   insulin aspart 100 UNIT/ML injection Commonly known as:  novoLOG Inject 22 Units into the skin 3 (three) times daily before meals.   isosorbide mononitrate 60 MG 24 hr tablet Commonly known as:  IMDUR Take 1 tablet (60 mg total) by mouth daily.   Lantus SoloStar 100  UNIT/ML Solostar Pen Generic drug:  Insulin Glargine Inject 112 Units into the skin 2 (two) times daily.   LORazepam 1 MG tablet Commonly known as:  ATIVAN Take 5 mg by mouth at bedtime.   nicotine 14 mg/24hr patch Commonly known as:  NICODERM CQ - dosed in mg/24 hours Place 1 patch (14 mg total) onto the skin daily.   nitroGLYCERIN 0.4 MG SL tablet Commonly known as:  NITROSTAT PLACE ONE TABLET UNDER THE TONGUE EVERY FIVE MINUTES AS NEEDED FOR CHEST PAIN. IF 3 TABLETS ARE NECESSARY CALL 911 What changed:  See the new instructions.   oxybutynin 10 MG 24 hr tablet Commonly known as:  DITROPAN-XL Take 10 mg by mouth daily.   oxyCODONE-acetaminophen 10-325 MG tablet Commonly known as:  PERCOCET Take 1 tablet by mouth every 6 (six) hours as needed for pain.   pantoprazole 40 MG tablet Commonly known as:  PROTONIX Take 40 mg by mouth daily.   potassium chloride SA 20 MEQ tablet Commonly known as:  K-DUR Take 20 mEq by mouth 3 (three) times daily.   PROBIOTIC PO Take 1 capsule by mouth daily.   tamsulosin 0.4 MG Caps capsule Commonly known as:  FLOMAX Take 0.4 mg by mouth daily.   torsemide 20 MG tablet Commonly known as:  DEMADEX TAKE THREE TABLETS BY MOUTH TWICE DAILY What changed:  when to take this   Tradjenta 5 MG Tabs tablet Generic drug:  linagliptin Take 5 mg by mouth daily.            Durable Medical Equipment  (From admission, onward)         Start     Ordered   11/09/18 1138  DME Oxygen  Once    Question Answer Comment  Mode or (Route) Nasal cannula   Liters per Minute 6   Frequency Continuous (stationary and portable oxygen unit needed)   Oxygen conserving device No   Oxygen delivery system Gas      11/09/18 1138         Allergies  Allergen Reactions  . Oxycodone Itching and Nausea Only    Pt is able to tolerate if taken with benadryl      The results of significant diagnostics from this hospitalization (including imaging,  microbiology, ancillary and laboratory) are listed below for reference.    Significant Diagnostic Studies: Ct Abdomen Pelvis Wo Contrast  Result Date: 11/07/2018 CLINICAL DATA:  Fever. Shortness of breath for the past 2 weeks. COVID-19 negative. Abdominal distention. EXAM: CT CHEST, ABDOMEN AND PELVIS WITHOUT CONTRAST TECHNIQUE: Multidetector CT imaging of the chest, abdomen and pelvis was performed  following the standard protocol without IV contrast. COMPARISON:  CT chest dated August 11, 2018. CT abdomen dated September 07, 2014. FINDINGS: CT CHEST FINDINGS Cardiovascular: Normal heart size. No pericardial effusion. Unchanged lipomatous hypertrophy of the interatrial septum. No thoracic aortic aneurysm. Coronary, aortic arch, and branch vessel atherosclerotic vascular disease. Normal caliber pulmonary arteries. Mediastinum/Nodes: No enlarged mediastinal, hilar, or axillary lymph nodes. Thyroid gland, trachea, and esophagus demonstrate no significant findings. Lungs/Pleura: New small focal area of peripheral ground-glass density in the anterior right upper lobe. Increased subsegmental atelectasis in the right lower lobe. Postsurgical changes related to prior left lower lobe wedge resection unchanged pleuroparenchymal thickening and architectural distortion in the mid to lower left hemithorax with extensive parenchymal banding. No focal consolidation, pleural effusion, or pneumothorax. Unchanged diffuse bronchial wall thickening with mild centrilobular and paraseptal emphysema. Musculoskeletal: Postsurgical changes related to prior left thoracotomy. No acute or significant osseous findings. CT ABDOMEN PELVIS FINDINGS Hepatobiliary: Unchanged hepatic steatosis. No focal liver abnormality. Prior cholecystectomy. No biliary dilatation. Pancreas: Unremarkable. No pancreatic ductal dilatation or surrounding inflammatory changes. Spleen: Normal in size without focal abnormality. Adrenals/Urinary Tract: The adrenal  glands are unremarkable. Prior right nephrectomy. Tiny subcentimeter hyperdense lesion in the posterior midpole of the left kidney, consistent with small hemorrhagic or proteinaceous cyst. No renal calculi or hydronephrosis. The bladder is unremarkable. Stomach/Bowel: Stomach is within normal limits. Appendix is surgically absent. No evidence of bowel wall thickening, distention, or inflammatory changes. Vascular/Lymphatic: Aortic atherosclerosis. No enlarged abdominal or pelvic lymph nodes. Reproductive: Prostate is unremarkable. Other: Small fat containing right inguinal and paraumbilical hernias. No free fluid or pneumoperitoneum. Musculoskeletal: Bilateral femoral head avascular necrosis. IMPRESSION: Chest: 1. New small focal area of peripheral ground-glass density in the anterior right upper lobe is likely infectious or inflammatory. 2. Unchanged pleuroparenchymal scarring and fibrosis in the mid to lower left hemithorax. 3.  Emphysema (ICD10-J43.9). 4.  Aortic atherosclerosis (ICD10-I70.0). Abdomen and pelvis: 1.  No acute intra-abdominal process. 2. Hepatic steatosis. 3. Prior right nephrectomy. 4. Bilateral femoral head avascular necrosis. Electronically Signed   By: Titus Dubin M.D.   On: 11/07/2018 23:33   Ct Chest Wo Contrast  Result Date: 11/07/2018 CLINICAL DATA:  Fever. Shortness of breath for the past 2 weeks. COVID-19 negative. Abdominal distention. EXAM: CT CHEST, ABDOMEN AND PELVIS WITHOUT CONTRAST TECHNIQUE: Multidetector CT imaging of the chest, abdomen and pelvis was performed following the standard protocol without IV contrast. COMPARISON:  CT chest dated August 11, 2018. CT abdomen dated September 07, 2014. FINDINGS: CT CHEST FINDINGS Cardiovascular: Normal heart size. No pericardial effusion. Unchanged lipomatous hypertrophy of the interatrial septum. No thoracic aortic aneurysm. Coronary, aortic arch, and branch vessel atherosclerotic vascular disease. Normal caliber pulmonary  arteries. Mediastinum/Nodes: No enlarged mediastinal, hilar, or axillary lymph nodes. Thyroid gland, trachea, and esophagus demonstrate no significant findings. Lungs/Pleura: New small focal area of peripheral ground-glass density in the anterior right upper lobe. Increased subsegmental atelectasis in the right lower lobe. Postsurgical changes related to prior left lower lobe wedge resection unchanged pleuroparenchymal thickening and architectural distortion in the mid to lower left hemithorax with extensive parenchymal banding. No focal consolidation, pleural effusion, or pneumothorax. Unchanged diffuse bronchial wall thickening with mild centrilobular and paraseptal emphysema. Musculoskeletal: Postsurgical changes related to prior left thoracotomy. No acute or significant osseous findings. CT ABDOMEN PELVIS FINDINGS Hepatobiliary: Unchanged hepatic steatosis. No focal liver abnormality. Prior cholecystectomy. No biliary dilatation. Pancreas: Unremarkable. No pancreatic ductal dilatation or surrounding inflammatory changes. Spleen: Normal in size without focal abnormality.  Adrenals/Urinary Tract: The adrenal glands are unremarkable. Prior right nephrectomy. Tiny subcentimeter hyperdense lesion in the posterior midpole of the left kidney, consistent with small hemorrhagic or proteinaceous cyst. No renal calculi or hydronephrosis. The bladder is unremarkable. Stomach/Bowel: Stomach is within normal limits. Appendix is surgically absent. No evidence of bowel wall thickening, distention, or inflammatory changes. Vascular/Lymphatic: Aortic atherosclerosis. No enlarged abdominal or pelvic lymph nodes. Reproductive: Prostate is unremarkable. Other: Small fat containing right inguinal and paraumbilical hernias. No free fluid or pneumoperitoneum. Musculoskeletal: Bilateral femoral head avascular necrosis. IMPRESSION: Chest: 1. New small focal area of peripheral ground-glass density in the anterior right upper lobe is likely  infectious or inflammatory. 2. Unchanged pleuroparenchymal scarring and fibrosis in the mid to lower left hemithorax. 3.  Emphysema (ICD10-J43.9). 4.  Aortic atherosclerosis (ICD10-I70.0). Abdomen and pelvis: 1.  No acute intra-abdominal process. 2. Hepatic steatosis. 3. Prior right nephrectomy. 4. Bilateral femoral head avascular necrosis. Electronically Signed   By: Titus Dubin M.D.   On: 11/07/2018 23:33   Dg Chest Port 1 View  Result Date: 11/07/2018 CLINICAL DATA:  Short of breath EXAM: PORTABLE CHEST 1 VIEW COMPARISON:  05/30/2018 FINDINGS: Cardiac enlargement. Mild vascular congestion without edema or effusion Chronic pleural and parenchymal scarring left base unchanged. Atherosclerotic aortic arch. IMPRESSION: Chronic scarring in the left lung base unchanged Mild vascular congestion without edema. Electronically Signed   By: Franchot Gallo M.D.   On: 11/07/2018 18:35    Microbiology: Recent Results (from the past 240 hour(s))  Blood Culture (routine x 2)     Status: None (Preliminary result)   Collection Time: 11/07/18  4:55 PM  Result Value Ref Range Status   Specimen Description   Final    BLOOD BLOOD LEFT ARM Performed at [facility], [address] [place], [place] [address]    Special Requests   Final    BOTTLES DRAWN AEROBIC AND ANAEROBIC Blood Culture adequate volume Performed at [facility], [address] [place], [place] [address]    Culture   Final    NO GROWTH < 24 HOURS Performed at [facility], [place] [address], [place], [place] [address]    Report Status PENDING  Incomplete  Blood Culture (routine x 2)     Status: None (Preliminary result)   Collection Time: 11/07/18  5:05 PM  Result Value Ref Range Status   Specimen Description   Final    BLOOD BLOOD RIGHT FOREARM Performed at [facility], [address] [place], [place] [address]    Special Requests   Final    BOTTLES DRAWN AEROBIC AND ANAEROBIC Blood Culture  adequate volume Performed at [facility], [address] [place], [place] [address]    Culture   Final    NO GROWTH < 24 HOURS Performed at [facility], [place] [address], [place], [place] [address]    Report Status PENDING  Incomplete  Urine culture     Status: None   Collection Time: 11/07/18  5:21 PM  Result Value Ref Range Status   Specimen Description   Final    URINE, RANDOM Performed at [facility], [address] [place], [place] [address]    Special Requests   Final    NONE Performed at [facility], [address] [place], [place] [address]    Culture   Final    NO GROWTH Performed at  [facility], [place] [address] [place], [place] [address]    Report Status 11/08/2018 FINAL  Final  SARS Coronavirus 2 [facility] order, Performed in [place] hospital lab)     Status: None   Collection Time: 11/07/18  6:30 PM  Result Value Ref Range Status   SARS Coronavirus 2 NEGATIVE NEGATIVE Final    Comment: (NOTE) If result is NEGATIVE SARS-CoV-2 target nucleic acids are NOT DETECTED. The SARS-CoV-2 RNA is generally detectable in upper and lower  respiratory specimens during the acute phase of infection. The lowest  concentration of SARS-CoV-2 viral copies this assay can detect is 250  copies / mL. A negative result does not preclude SARS-CoV-2 infection  and should not be used as the sole basis for treatment or other  patient management decisions.  A negative result may occur with  improper specimen collection / handling, submission of specimen other  than nasopharyngeal swab, presence of viral mutation(s) within the  areas targeted by this assay, and inadequate number of viral copies  (<250 copies / mL). A negative result must be combined with clinical  observations, patient history, and epidemiological information. If result is POSITIVE SARS-CoV-2 target nucleic acids are DETECTED. The SARS-CoV-2 RNA is generally detectable  in upper and lower  respiratory specimens dur ing the acute phase of infection.  Positive  results are indicative of active infection with SARS-CoV-2.  Clinical  correlation with patient history and other diagnostic information is  necessary to determine patient infection status.  Positive results do  not rule out bacterial infection or co-infection with other viruses. If result is PRESUMPTIVE POSTIVE SARS-CoV-2 nucleic acids MAY BE PRESENT.   A presumptive positive result was obtained on the submitted specimen  and confirmed on repeat testing.  While 2019 novel coronavirus  (SARS-CoV-2) nucleic acids may be present in the submitted sample  additional confirmatory testing may be necessary for epidemiological  and / or clinical management purposes  to differentiate between  SARS-CoV-2 and other Sarbecovirus currently known to infect humans.  If clinically indicated additional testing with an alternate test  methodology [phone]) is advised. The SARS-CoV-2 RNA is generally  detectable in upper and lower respiratory sp ecimens during the acute  phase of infection. The expected result is Negative. Fact Sheet for Patients:  StrictlyIdeas.no Fact Sheet for Healthcare Providers: BankingDealers.co.za This test is not yet approved or cleared by the [place] FDA and has been authorized for detection and/or diagnosis of SARS-CoV-2 by FDA under an Emergency Use Authorization (EUA).  This EUA will remain in effect (meaning this test can be used) for the duration of the COVID-19 declaration under Section 564(b)(1) of the Act, 21 U.S.C. section 360bbb-3(b)(1), unless the authorization is terminated or revoked sooner. Performed at [facility], [place] [address], [place], [place] [address]   Novel Coronavirus, NAA (hospital order; send-out to ref lab)     Status: None   Collection Time: 11/07/18 11:49 PM  Result Value Ref Range Status    SARS-CoV-2, NAA NOT DETECTED NOT DETECTED Final    Comment: (NOTE) This test was developed and its performance characteristics determined by [employer]. This test has not been FDA cleared or approved. This test has been authorized by FDA under an Emergency Use Authorization (EUA). This test has been validated in accordance with the FDA's Guidance Document Adult nurse for [place] in Laboratories Certified to Perform High Complexity Testing under CLIA prior to Emergency Use Authorization for Coronavirus [id number] during the Public  Health Emergency) issued on February 29th, 2020. FDA independent review of this validation is pending. This test is only authorized for the duration of time the declaration that circumstances exist justifying the authorization of the emergency use of in vitro diagnostic tests for detection of SARS-CoV- 2 virus and/or diagnosis of COVID-19 infection under section 564(b)(1) of the Act, 21 U.S.C. 841YSA-6(T)(0), unless the authorization is terminated or revoked sooner. Performed At: [facility] [address] [place], [place] [id number] Rush Farmer MD FT:[id number]    [place]  Final    Comment: Performed at [facility], [address] [place], [place] [address]     Labs: Basic Metabolic Panel: Recent Labs  Lab 11/07/18 1658 11/07/18 1717 11/08/18 0453 11/09/18 0838  NA 129* 131* 133* 135  K 3.7 4.0 3.9 4.1  CL 93*  --  102 104  CO2 22  --  23 23  GLUCOSE 539*  --  360* 278*  BUN 20  --  18 11  CREATININE 1.91*  --  1.40* 1.14  CALCIUM 8.5*  --  8.2* 8.7*   Liver Function Tests: Recent Labs  Lab 11/07/18 1658 11/09/18 0838  AST 15 11*  ALT 12 11  ALKPHOS 103 78  BILITOT 0.5 0.5  PROT 7.1 6.5  ALBUMIN 3.5 2.9*   Recent Labs  Lab 11/07/18 1658  LIPASE 42   Recent Labs  Lab 11/07/18 1700  AMMONIA 27   CBC: Recent Labs  Lab 11/07/18 1658 11/07/18 1717  11/09/18 0838  WBC 9.2  --  8.0  NEUTROABS 7.1  --  6.3  HGB 13.2 13.6 13.0  HCT 40.3 40.0 40.1  MCV 83.3  --  85.3  PLT 197  --  173   Cardiac Enzymes: Recent Labs  Lab 11/07/18 1659 11/07/18 2257  TROPONINI <0.03 <0.03   BNP: BNP (last 3 results) Recent Labs    05/30/18 2347 11/07/18 1658  BNP 71.2 72.5    ProBNP (last 3 results) No results for input(s): PROBNP in the last 8760 hours.  CBG: Recent Labs  Lab 11/08/18 0847 11/08/18 1303 11/08/18 1650 11/09/18 0008 11/09/18 0810  GLUCAP 323* 272* 277* 318* 259*       Signed:  Nita Sells MD   [facility] 11/09/2018, 11:39 AM

## 2018-11-12 LAB — CULTURE, BLOOD (ROUTINE X 2)
Culture: NO GROWTH
Culture: NO GROWTH
Special Requests: ADEQUATE
Special Requests: ADEQUATE

## 2018-12-22 ENCOUNTER — Telehealth: Payer: Self-pay | Admitting: Internal Medicine

## 2018-12-22 ENCOUNTER — Other Ambulatory Visit: Payer: Self-pay

## 2018-12-22 ENCOUNTER — Encounter: Payer: Self-pay | Admitting: Adult Health

## 2018-12-22 ENCOUNTER — Ambulatory Visit (INDEPENDENT_AMBULATORY_CARE_PROVIDER_SITE_OTHER): Payer: Medicaid Other | Admitting: Adult Health

## 2018-12-22 ENCOUNTER — Telehealth: Payer: Self-pay | Admitting: Cardiology

## 2018-12-22 DIAGNOSIS — J9621 Acute and chronic respiratory failure with hypoxia: Secondary | ICD-10-CM | POA: Diagnosis not present

## 2018-12-22 NOTE — Telephone Encounter (Signed)
Mom [name]) notified as he had left already to go to Pulmonology visit.  Will contact their office for notification.

## 2018-12-22 NOTE — Patient Instructions (Signed)
You need to be admitted to hospital , we recommend EMS transport to Emergency Room for evaluation .  This is the safest option for you.

## 2018-12-22 NOTE — Telephone Encounter (Signed)
Pt seeing TP today at 2:30. There is a note from today that can be seen in Epic from pt's cardiologist Dr. [name].  Routing this to TP as an FYI for her to look at the note from cardiologist. Dr. Myles Gip phone number is [phone].

## 2018-12-22 NOTE — Telephone Encounter (Signed)
Patient called stating that he was seen by [facility] today and was told that he had a lot of fluid buildup.Was told to call Dr. Myles Gip office. Please call [phone].

## 2018-12-22 NOTE — Telephone Encounter (Signed)
Chest pain daily x last month.  States that he has been taking 2 Ntg under tongue with relief.  Has not gone to ED with any of these episodes.  Says the Ntg relieves it enough for him to lay down & feel better afterward.  Any amount of exertion makes this worse.  SOB at all times.  On 6 L/min of oxygen at all times.  Weight today at Urologist was 237lb - 1 month ago at same office 213lb.  Was told by MD there that he had a lot of fluid around his heart and was told to contact us.    Abdomen is swollen, left side is larger.  Does have Pulmonologist appt at 2:30 today as well.

## 2018-12-22 NOTE — Telephone Encounter (Signed)
Pulmonologist office notified.  Will forward this note to Rexene Edison, NP whom he will be seeing today at 2:30.

## 2018-12-22 NOTE — Telephone Encounter (Signed)
Obviously, very difficult to evaluate over the phone.  Based on what is described to me, it sounds like he has gained a significant amount of weight, potentially fluid and is having accelerating angina symptoms.  He has both known CAD and chronic diastolic heart failure.  He also has end-stage interstitial lung disease with chronic hypoxic respiratory failure and in the past has been considered for hospice.  I suspect he will need to be hospitalized to get things under control, particularly if he needs significant IV diuresis.  If he is seeing his pulmonologist in less than an hour, I would suggest contacting their office to let them know that he may need to be referred on to the hospital for further assessment, unless they find the clinical situation to be different on examination.

## 2018-12-22 NOTE — Progress Notes (Signed)
@Patient  ID: [name], male    DOB: 07-Mar-1956, 63 y.o.   MRN: [id number]  No chief complaint on file.   Referring provider: [name]  HPI: 63 year old male active smoker followed for interstitial lung disease with emphysema.  Surgical lung biopsy 2015 read as DIP, reinterpreted February 2020 at [facility] felt to be smoking-related interstitial fibrosis with associated emphysema.,  Chronic respiratory failure on oxygen Medical history significant for chronic pain  TEST/EVENTS :  Lungs/Pleura: Postoperative changes of wedge resection are again noted in the left lower lobe and tracking along the left major fissure, similar to the prior study. However, compared to the prior examination there is greatly increased pleuroparenchymal thickening and architectural distortion in the mid to lower left hemithorax with extensive parenchymal banding throughout this region. These findings are markedly asymmetric compared to the contralateral side where there is only minimal linear scarring in the posterior aspect of the right lower lobe in the most dependent portion of the lung. High-resolution images otherwise demonstrate no widespread areas of ground-glass attenuation, septal thickening, traction bronchiectasis or honeycombing elsewhere in the lungs. Inspiratory and expiratory imaging demonstrates mild air trapping, most evident in the left lower lobe. No acute consolidative airspace disease. No pleural effusions. Diffuse bronchial wall thickening with mild centrilobular and paraseptal emphysema.  IMPRESSION: 1. Although there are areas of increasing fibrosis throughout the mid to lower lung, these findings are clearly asymmetric with the contralateral side and are favored to be related to developing areas of pleuroparenchymal scarring, potentially related to prior surgery. The pattern is not a usual pattern for any typical interstitial lung disease, and there is  no involvement of the right lung. 2. Diffuse bronchial wall thickening with mild centrilobular and paraseptal emphysema; imaging findings compatible with the reported clinical history of underlying COPD. 3. Air trapping, most evident in the left lower lobe, indicative of small airways disease. 4. Aortic atherosclerosis, in addition to left main and 3 vessel coronary artery disease. Please note that although the presence of coronary artery calcium documents the presence of coronary artery disease, the severity of this disease and any potential stenosis cannot be assessed on this non-gated CT examination. Assessment for potential risk factor modification, dietary therapy or pharmacologic therapy may be warranted, if clinically indicated. 5. Severe hepatic steatosis. 6. Lipomatous hypertrophy of the interatrial septum.  No obvious eval was done that thank you Opyd  12/22/2018 Acute OV : Cough and fever  Patient presents for an acute office visit.  Patient has interstitial lung disease with emphysema on chronic oxygen.  He continues to smoke.  Patient says over the last month he has had progressively worsening shortness of breath fluid retention weight is up over 20 pounds.  He had intermittent chest pain.  Over the last several days patient has been getting worse with increased cough, shortness of breath low-grade fevers at 99.1.  Patient was seen by his urologist today and weight was up 24 pounds since last month.  He had abdominal swelling and pressure.  According to patient patient was recommended by urology that he need to have further evaluation.  Patient had a phone message to cardiology for the above symptoms and was recommended to go to the emergency room for possible hospital admission.  Patient made appointment with our office for the above symptoms.  Patient says he has a hard time getting his breath is very weak.  On arrival to the office today patient's blood pressure was 94/60.  O2  saturations 95% on 6 L Patient is visibly short of breath on oxygen .  Patient is on Bevespi twice daily. He is on Demadex 60 mg twice daily   Allergies  Allergen Reactions   Oxycodone Itching and Nausea Only    Pt is able to tolerate if taken with benadryl    Immunization History  Administered Date(s) Administered   Influenza Split 04/04/2014, 03/27/2015   Pneumococcal Polysaccharide-23 11/04/2012    Past Medical History:  Diagnosis Date   Anxiety    Arthritis    Cholecystitis, acute 12/20/2013   Lap chole 6/5   Cocaine abuse ([name])    COPD (chronic obstructive pulmonary disease) ([name])    Coronary atherosclerosis of native coronary artery    a. 03/09/2013 Cath/PCI: LM nl, LAD: 50p, 33m (2.5x16 promus DES), LCX nl, OM1 min irregs, LPL/LPDA diff dzs, RCA nondom, mod diff dzs, EF 55%.   Depression    Essential hypertension, benign    GERD (gastroesophageal reflux disease)    Headache    Hepatitis Late 1970s   History of nephrolithiasis    History of pneumonia    History of stroke    Right MCA distribution, residual left-sided weakness   Hyperlipidemia    Noncompliance    Renal cell carcinoma ([name])    Status post radical right nephrectomy August 2015   Sleep apnea    On CPAP, 4L O2 no cpap at home yet   Type 2 diabetes mellitus ([place]) 2011    Tobacco History: Social History   Tobacco Use  Smoking Status Current Some Day Smoker   Packs/day: 1.00   Years: 41.00   Pack years: 41.00   Types: Cigarettes   Start date: 05/03/1972  Smokeless Tobacco Never Used  Tobacco Comment   2cigs per day as of 08/01/2018   Ready to quit: No Counseling given: Yes Comment: 2cigs per day as of 08/01/2018   Outpatient Medications Prior to Visit  Medication Sig Dispense Refill   ALPRAZolam (XANAX) 0.5 MG tablet Take 1 tablet (0.5 mg total) by mouth 3 (three) times daily as needed for anxiety.  0   atorvastatin (LIPITOR) 20 MG tablet Take 20 mg by mouth at  bedtime.  1   clopidogrel (PLAVIX) 75 MG tablet Take 1 tablet (75 mg total) by mouth daily. 90 tablet 3   diltiazem (CARDIZEM CD) 240 MG 24 hr capsule Take 240 mg by mouth daily.     docusate sodium (COLACE) 100 MG capsule Take 100 mg by mouth at bedtime.     FLUoxetine (PROZAC) 10 MG capsule Take 10 mg by mouth every morning. Take with 40 mg capsule = 50 mg total  0   FLUoxetine (PROZAC) 40 MG capsule Take 40 mg by mouth daily. Take with 10 mg capsule = 50 mg total     gabapentin (NEURONTIN) 300 MG capsule Take 1 capsule (300 mg total) by mouth 3 (three) times daily. (Patient taking differently: Take 900 mg by mouth 3 (three) times daily. )     Glycopyrrolate-Formoterol (BEVESPI AEROSPHERE) 9-4.8 MCG/ACT AERO Inhale 2 puffs into the lungs 2 (two) times daily. 1 Inhaler 11   ibuprofen (ADVIL) 200 MG tablet Take 400 mg by mouth every 6 (six) hours as needed for fever, headache or mild pain.     insulin aspart (NOVOLOG) 100 UNIT/ML injection Inject 22 Units into the skin 3 (three) times daily before meals.      isosorbide mononitrate (IMDUR) 60 MG 24 hr tablet Take 1 tablet (60  mg total) by mouth daily. 90 tablet 3   LANTUS SOLOSTAR 100 UNIT/ML Solostar Pen Inject 112 Units into the skin 2 (two) times daily.   4   linagliptin (TRADJENTA) 5 MG TABS tablet Take 5 mg by mouth daily.     LORazepam (ATIVAN) 1 MG tablet Take 5 mg by mouth at bedtime.     nitroGLYCERIN (NITROSTAT) 0.4 MG SL tablet PLACE ONE TABLET UNDER THE TONGUE EVERY FIVE MINUTES AS NEEDED FOR CHEST PAIN. IF 3 TABLETS ARE NECESSARY CALL 911 (Patient taking differently: Place 0.4 mg under the tongue every 5 (five) minutes as needed for chest pain. PLACE ONE TABLET UNDER THE TONGUE EVERY FIVE MINUTES AS NEEDED FOR CHEST PAIN. IF 3 TABLETS ARE NECESSARY CALL 911) 25 tablet 3   oxybutynin (DITROPAN-XL) 10 MG 24 hr tablet Take 10 mg by mouth daily.  11   oxyCODONE-acetaminophen (PERCOCET) 10-325 MG tablet Take 1 tablet by mouth  every 6 (six) hours as needed for pain. 30 tablet 0   pantoprazole (PROTONIX) 40 MG tablet Take 40 mg by mouth daily.     potassium chloride SA (K-DUR,KLOR-CON) 20 MEQ tablet Take 20 mEq by mouth 3 (three) times daily.     Probiotic Product (PROBIOTIC PO) Take 1 capsule by mouth daily.     tamsulosin (FLOMAX) 0.4 MG CAPS capsule Take 0.4 mg by mouth daily.      torsemide (DEMADEX) 20 MG tablet TAKE THREE TABLETS BY MOUTH TWICE DAILY (Patient taking differently: Take 60 mg by mouth 2 (two) times daily. ) 620 tablet 3   nicotine (NICODERM CQ - DOSED IN MG/24 HOURS) 14 mg/24hr patch Place 1 patch (14 mg total) onto the skin daily. (Patient not taking: Reported on 11/08/2018) 28 patch 0   No facility-administered medications prior to visit.      Review of Systems:   Constitutional:   No  weight loss, night sweats,   +Fevers, chills, fatigue, or  lassitude.  HEENT:   No headaches,  Difficulty swallowing,  Tooth/dental problems, or  Sore throat,                No sneezing, itching, ear ache, nasal congestion, post nasal drip,   CV:  No chest pain,  Orthopnea, PND, +swelling in lower extremities, anasarca, dizziness, palpitations, syncope. +chest tightness   GI  No heartburn, indigestion, abdominal pain, nausea, vomiting, diarrhea, change in bowel habits, loss of appetite, bloody stools.   Resp:++shortness of breath with exertion or at rest.  No excess mucus, no productive cough, + wheezing  Skin: no rash or lesions.  GU: no dysuria, change in color of urine, no urgency or frequency.  No flank pain, no hematuria   MS:  No joint pain or swelling.  No decreased range of motion.  No back pain.    Physical Exam  BP 94/60    Pulse 98    Temp 98.1 F (36.7 C) (Oral)    Ht 5\' 9"  (1.753 m)    Wt 236 lb 3.2 oz (107.1 kg)    SpO2 95%    BMI 34.88 kg/m   GEN: A/Ox3; pleasant ,  elderly chronically ill-appearing on oxygen   HEENT:  [place]/AT,    NOSE-clear, THROAT-clear, no lesions, no  postnasal drip or exudate noted.   NECK:  Supple w/ fair ROM; no JVD; normal carotid impulses w/o bruits; no thyromegaly or nodules palpated; no lymphadenopathy.    RESP scattered rhonchi with a few expiratory wheezes  no accessory muscle  use, no dullness to percussion  CARD:  RRR, no m/r/g, 1-2 + peripheral edema, pulses intact, no cyanosis or  ++clubbing.  GI :   distended abd , no guarding   [place]: Warm bil, no deformities or joint swelling noted.   Neuro: alert, no focal deficits noted.    Skin: Warm, no lesions or rashes    Lab Results:  CBC    Component Value Date/Time   WBC 8.0 11/09/2018 0838   RBC 4.70 11/09/2018 0838   HGB 13.0 11/09/2018 0838   HCT 40.1 11/09/2018 0838   PLT 173 11/09/2018 0838   MCV 85.3 11/09/2018 0838   MCH 27.7 11/09/2018 0838   MCHC 32.4 11/09/2018 0838   RDW 13.3 11/09/2018 0838   LYMPHSABS 1.1 11/09/2018 0838   MONOABS 0.5 11/09/2018 0838   EOSABS 0.1 11/09/2018 0838   BASOSABS 0.0 11/09/2018 0838    BMET    Component Value Date/Time   NA 135 11/09/2018 0838   K 4.1 11/09/2018 0838   CL 104 11/09/2018 0838   CO2 23 11/09/2018 0838   GLUCOSE 278 (H) 11/09/2018 0838   BUN 11 11/09/2018 0838   CREATININE 1.14 11/09/2018 0838   CREATININE 2.67 (H) 09/26/2014 1138   CALCIUM 8.7 (L) 11/09/2018 0838   GFRNONAA >60 11/09/2018 0838   GFRAA >60 11/09/2018 0838    BNP    Component Value Date/Time   BNP 72.5 11/07/2018 1658    ProBNP    Component Value Date/Time   PROBNP 58.0 05/10/2015 1555    Imaging: No results found.    PFT Results Latest Ref Rng & Units 09/08/2018 09/07/2014 04/13/2014  FVC-Pre L 3.80 4.03 4.37  FVC-Predicted Pre % 81 83 90  FVC-Post L - 4.29 4.43  FVC-Predicted Post % - 89 92  Pre FEV1/FVC % % 76 81 82  Post FEV1/FCV % % - 83 83  FEV1-Pre L 2.88 3.28 3.58  FEV1-Predicted Pre % 82 90 97  FEV1-Post L - 3.58 3.67  DLCO UNC% % 51 50 55  DLCO COR %Predicted % 62 57 63  TLC L - 6.36 6.11  TLC %  Predicted % - 90 87  RV % Predicted % - 85 76    No results found for: NITRICOXIDE      Assessment & Plan:   Acute on chronic respiratory failure with hypoxia ([name]) Acute decompensation with underlying emphysema and ILD on high oxygen demands at baseline.  Patient has acute symptoms of cough and fever along with suspected decompensated diastolic heart failure. Weight is up greater than 20 pounds he has significant abdominal distention.  Has multiple comorbidities.  His risk for decompensation is high.  Patient needs further evaluation and COVID-19 testing. He is recommended to be evaluated in the emergency room for further treatment options and possible admission.  He needs to be transported by EMS.  Patient says he declined EMS transport.  He has an aide who will take him.  Advised him of the dangers of this as he is on high oxygen demand he further declined to go.  Says he will go to the hospital at [facility]  We will contact the ER triage and alert them and also that he will need to have COVID precautions  Plan  Patient Instructions  You need to be admitted to hospital , we recommend EMS transport to Emergency Room for evaluation .  This is the safest option for you.         Irving Bloor  Garrit Marrow, NP 12/22/2018

## 2018-12-22 NOTE — Telephone Encounter (Signed)
See note below - sent to [place]:   Acute on chronic respiratory failure with hypoxia ([place]) Acute decompensation with underlying emphysema and ILD on high oxygen demands at baseline.  Patient has acute symptoms of cough and fever along with suspected decompensated diastolic heart failure. Weight is up greater than 20 pounds he has significant abdominal distention.  Has multiple comorbidities.  His risk for decompensation is high.  Patient needs further evaluation and COVID-19 testing. He is recommended to be evaluated in the emergency room for further treatment options and possible admission.  He needs to be transported by EMS.  Patient says he declined EMS transport.  He has an aide who will take him.  Advised him of the dangers of this as he is on high oxygen demand he further declined to go.  Says he will go to the hospital at [facility].  We will contact the ER triage and alert them and also that he will need to have COVID precautions  Plan  Patient Instructions  You need to be admitted to hospital , we recommend EMS transport to Emergency Room for evaluation .  This is the safest option for you.    Rexene Edison, NP 12/22/2018

## 2018-12-22 NOTE — Assessment & Plan Note (Signed)
Acute decompensation with underlying emphysema and ILD on high oxygen demands at baseline.  Patient has acute symptoms of cough and fever along with suspected decompensated diastolic heart failure. Weight is up greater than 20 pounds he has significant abdominal distention.  Has multiple comorbidities.  His risk for decompensation is high.  Patient needs further evaluation and COVID-19 testing. He is recommended to be evaluated in the emergency room for further treatment options and possible admission.  He needs to be transported by EMS.  Patient says he declined EMS transport.  He has an aide who will take him.  Advised him of the dangers of this as he is on high oxygen demand he further declined to go.  Says he will go to the hospital at [facility].  We will contact the ER triage and alert them and also that he will need to have COVID precautions  Plan  Patient Instructions  You need to be admitted to hospital , we recommend EMS transport to Emergency Room for evaluation .  This is the safest option for you.

## 2018-12-23 ENCOUNTER — Other Ambulatory Visit: Payer: Self-pay

## 2018-12-23 ENCOUNTER — Emergency Department (HOSPITAL_COMMUNITY): Payer: Medicaid Other

## 2018-12-23 ENCOUNTER — Emergency Department (HOSPITAL_COMMUNITY)
Admission: EM | Admit: 2018-12-23 | Discharge: 2018-12-23 | Discharge status: Against Medical Advice | Payer: Medicaid Other | Attending: Emergency Medicine | Admitting: Emergency Medicine

## 2018-12-23 ENCOUNTER — Encounter (HOSPITAL_COMMUNITY): Payer: Self-pay

## 2018-12-23 DIAGNOSIS — R079 Chest pain, unspecified: Secondary | ICD-10-CM

## 2018-12-23 DIAGNOSIS — E872 Acidosis, unspecified: Secondary | ICD-10-CM

## 2018-12-23 DIAGNOSIS — R579 Shock, unspecified: Secondary | ICD-10-CM | POA: Diagnosis not present

## 2018-12-23 DIAGNOSIS — R0789 Other chest pain: Secondary | ICD-10-CM | POA: Insufficient documentation

## 2018-12-23 DIAGNOSIS — R0600 Dyspnea, unspecified: Secondary | ICD-10-CM | POA: Diagnosis not present

## 2018-12-23 DIAGNOSIS — IMO0002 Reserved for concepts with insufficient information to code with codable children: Secondary | ICD-10-CM | POA: Diagnosis present

## 2018-12-23 DIAGNOSIS — J449 Chronic obstructive pulmonary disease, unspecified: Secondary | ICD-10-CM | POA: Insufficient documentation

## 2018-12-23 DIAGNOSIS — I959 Hypotension, unspecified: Secondary | ICD-10-CM | POA: Diagnosis present

## 2018-12-23 DIAGNOSIS — E860 Dehydration: Secondary | ICD-10-CM | POA: Diagnosis not present

## 2018-12-23 DIAGNOSIS — A419 Sepsis, unspecified organism: Secondary | ICD-10-CM | POA: Diagnosis present

## 2018-12-23 DIAGNOSIS — J439 Emphysema, unspecified: Secondary | ICD-10-CM | POA: Diagnosis present

## 2018-12-23 DIAGNOSIS — Z20828 Contact with and (suspected) exposure to other viral communicable diseases: Secondary | ICD-10-CM | POA: Insufficient documentation

## 2018-12-23 DIAGNOSIS — E1165 Type 2 diabetes mellitus with hyperglycemia: Secondary | ICD-10-CM | POA: Diagnosis present

## 2018-12-23 DIAGNOSIS — I2510 Atherosclerotic heart disease of native coronary artery without angina pectoris: Secondary | ICD-10-CM | POA: Diagnosis not present

## 2018-12-23 DIAGNOSIS — Z720 Tobacco use: Secondary | ICD-10-CM | POA: Diagnosis not present

## 2018-12-23 DIAGNOSIS — E119 Type 2 diabetes mellitus without complications: Secondary | ICD-10-CM | POA: Insufficient documentation

## 2018-12-23 DIAGNOSIS — Z5320 Procedure and treatment not carried out because of patient's decision for unspecified reasons: Secondary | ICD-10-CM | POA: Insufficient documentation

## 2018-12-23 DIAGNOSIS — J9611 Chronic respiratory failure with hypoxia: Secondary | ICD-10-CM | POA: Diagnosis not present

## 2018-12-23 DIAGNOSIS — Z9981 Dependence on supplemental oxygen: Secondary | ICD-10-CM | POA: Diagnosis not present

## 2018-12-23 DIAGNOSIS — Z85528 Personal history of other malignant neoplasm of kidney: Secondary | ICD-10-CM | POA: Insufficient documentation

## 2018-12-23 DIAGNOSIS — I10 Essential (primary) hypertension: Secondary | ICD-10-CM | POA: Diagnosis not present

## 2018-12-23 HISTORY — DX: Hypotension, unspecified: I95.9

## 2018-12-23 LAB — CBC
HCT: 41.6 % (ref 39.0–52.0)
Hemoglobin: 13.5 g/dL (ref 13.0–17.0)
MCH: 27.4 pg (ref 26.0–34.0)
MCHC: 32.5 g/dL (ref 30.0–36.0)
MCV: 84.6 fL (ref 80.0–100.0)
Platelets: 234 10*3/uL (ref 150–400)
RBC: 4.92 MIL/uL (ref 4.22–5.81)
RDW: 14.3 % (ref 11.5–15.5)
WBC: 11.4 10*3/uL — ABNORMAL HIGH (ref 4.0–10.5)
nRBC: 0 % (ref 0.0–0.2)

## 2018-12-23 LAB — TROPONIN I: Troponin I: <0.03 ng/mL (ref <0.03)

## 2018-12-23 LAB — BASIC METABOLIC PANEL
Anion gap: 12 (ref 5–15)
BUN: 15 mg/dL (ref 8–23)
CO2: 26 mmol/L (ref 22–32)
Calcium: 9.3 mg/dL (ref 8.9–10.3)
Chloride: 96 mmol/L — ABNORMAL LOW (ref 98–111)
Creatinine, Ser: 1.8 mg/dL — ABNORMAL HIGH (ref 0.61–1.24)
GFR calc Af Amer: 45 mL/min — ABNORMAL LOW (ref >60)
GFR calc non Af Amer: 39 mL/min — ABNORMAL LOW (ref >60)
Glucose, Bld: 341 mg/dL — ABNORMAL HIGH (ref 70–99)
Potassium: 4.1 mmol/L (ref 3.5–5.1)
Sodium: 134 mmol/L — ABNORMAL LOW (ref 135–145)

## 2018-12-23 LAB — LACTIC ACID, PLASMA: Lactic Acid, Venous: 3.1 mmol/L (ref 0.5–1.9)

## 2018-12-23 LAB — BRAIN NATRIURETIC PEPTIDE: B Natriuretic Peptide: 44.9 pg/mL (ref 0.0–100.0)

## 2018-12-23 LAB — D-DIMER, QUANTITATIVE: D-Dimer, Quant: 0.54 ug/mL-FEU — ABNORMAL HIGH (ref 0.00–0.50)

## 2018-12-23 LAB — SARS CORONAVIRUS 2 BY RT PCR (HOSPITAL ORDER, PERFORMED IN ~~LOC~~ HOSPITAL LAB): SARS Coronavirus 2: NEGATIVE

## 2018-12-23 MED ORDER — SODIUM CHLORIDE 0.9% FLUSH
3.0000 mL | Freq: Once | INTRAVENOUS | Status: AC
  Administered 2018-12-23: 3 mL via INTRAVENOUS

## 2018-12-23 MED ORDER — SODIUM CHLORIDE 0.9 % IV BOLUS
500.0000 mL | Freq: Once | INTRAVENOUS | Status: AC
  Administered 2018-12-23: 500 mL via INTRAVENOUS

## 2018-12-23 MED ORDER — LEVOFLOXACIN 500 MG PO TABS: 500.0000 mg | ORAL_TABLET | Freq: Every day | ORAL | 0 refills | Status: AC

## 2018-12-23 NOTE — ED Triage Notes (Signed)
Pt here for evaluation of increased fluid around his heart, was seen by kidney doctor yesterday and was told to come here due to hypotension. Pt c.o increased SOB over the past few days, pt always on 6L [place]. Pt in no resp distress at this time.

## 2018-12-23 NOTE — Consult Note (Signed)
Medical Consultation   [name]  JOI:[id number]  DOB: 09/22/1955  DOA: 12/23/2018  PCP: Camille Bal, PA-C  Outpatient Specialists:    Requesting physician: Dr Sedonia Small  Reason for consultation: Admission for hypotension and possible infection with fever   History of Present Illness: [name] is an 63 y.o. male past medical history significant for COPD, diabetes mellitus, chronic respiratory failure on 6 L nasal cannula oxygen at baseline, recent cough and fevers who presents at the request of his pulmonologist due to hypotension.  He was told that he had a lot of fluid and needed to have it removed from around his heart.  On presentation to the emergency department patient was massively hypotensive with blood pressures in the 47S systolic.  He looked very unwell and rapidly resuscitated by the excellent care of his ER physician and team.  X-ray only revealed a small pleural effusion and bilateral atelectasis EKG was unremarkable and patient's hypotension resolved however his creatinine is elevated his lactic acid is elevated and his white blood cell count is at 11.4.  Was referred to me for admission.    Review of Systems:  As per HPI otherwise 10 point review of systems negative.     Past Medical History: Past Medical History:  Diagnosis Date   Anxiety    Arthritis    Cholecystitis, acute 12/20/2013   Lap chole 6/5   Cocaine abuse ([place])    COPD (chronic obstructive pulmonary disease) ([name])    Coronary atherosclerosis of native coronary artery    a. 03/09/2013 Cath/PCI: LM nl, LAD: 50p, 25m (2.5x16 promus DES), LCX nl, OM1 min irregs, LPL/LPDA diff dzs, RCA nondom, mod diff dzs, EF 55%.   Depression    Essential hypertension, benign    GERD (gastroesophageal reflux disease)    Headache    Hepatitis Late 1970s   History of nephrolithiasis    History of pneumonia    History of stroke    Right MCA distribution, residual left-sided  weakness   Hyperlipidemia    Noncompliance    Renal cell carcinoma ([name])    Status post radical right nephrectomy August 2015   Sleep apnea    On CPAP, 4L O2 no cpap at home yet   Type 2 diabetes mellitus ([place]) 2011    Past Surgical History: Past Surgical History:  Procedure Laterality Date   APPENDECTOMY  1970's   CHOLECYSTECTOMY N/A 12/22/2013   Procedure: LAPAROSCOPIC CHOLECYSTECTOMY ;  Surgeon: [name], MD;  Location: [facility];  Service: General;  Laterality: N/A;   ENDARTERECTOMY Right 10/08/2014   Procedure: Right ENDARTERECTOMY CAROTID;  Surgeon: [name], MD;  Location: [facility];  Service: Vascular;  Laterality: Right;   LAPAROSCOPIC LYSIS OF ADHESIONS  02/21/2014   Procedure: LAPAROSCOPIC LYSIS OF ADHESIONS EXTINSIVE;  Surgeon: [name], MD;  Location: [facility];  Service: Urology;;   LEFT HEART CATHETERIZATION WITH CORONARY ANGIOGRAM N/A 06/02/2012   Procedure: LEFT HEART CATHETERIZATION WITH CORONARY ANGIOGRAM;  Surgeon: [name], MD;  [facility] CATH LAB;  Service: Cardiovascular;  Laterality: N/A;   LEFT HEART CATHETERIZATION WITH CORONARY ANGIOGRAM N/A 03/09/2013   Procedure: LEFT HEART CATHETERIZATION WITH CORONARY ANGIOGRAM;  Surgeon: [name], MD;  Location: [facility] CATH LAB;  Service: Cardiovascular;  Laterality: N/A;   LEFT HEART CATHETERIZATION WITH CORONARY ANGIOGRAM N/A 12/20/2013   Procedure: LEFT HEART CATHETERIZATION WITH CORONARY ANGIOGRAM;  Surgeon: Harrell Gave  Santina Evans, MD;  Location: [facility] CATH LAB;  Service: Cardiovascular;  Laterality: N/A;   LUNG BIOPSY Left 05/18/2014   Procedure: LUNG BIOPSY left upper lobe & left lower lobe;  Surgeon: [name], MD;  Location: [facility];  Service: Thoracic;  Laterality: Left;   PATCH ANGIOPLASTY Right 10/08/2014   Procedure: PATCH ANGIOPLASTY Right Carotid;  Surgeon: [name], MD;  Location: [facility];  Service: Vascular;  Laterality: Right;   ROBOT ASSISTED LAPAROSCOPIC NEPHRECTOMY  Right 02/21/2014   Procedure: ROBOTIC ASSISTED LAPAROSCOPIC RIGHT NEPHRECTOMY ;  Surgeon: [name], MD;  Location: [facility];  Service: Urology;  Laterality: Right;   VIDEO ASSISTED THORACOSCOPY Left 05/18/2014   Procedure: LEFT VIDEO ASSISTED THORACOSCOPY;  Surgeon: [name], MD;  Location: [place];  Service: Thoracic;  Laterality: Left;   VIDEO BRONCHOSCOPY  05/18/2014   Procedure: VIDEO BRONCHOSCOPY;  Surgeon: [name], MD;  Location: [facility] [place];  Service: Thoracic;;     Allergies:   Allergies  Allergen Reactions   Oxycodone Itching and Nausea Only    Pt is able to tolerate if taken with benadryl     Social History:  reports that he has been smoking cigarettes. He started smoking about 46 years ago. He has a 41.00 pack-year smoking history. He has never used smokeless tobacco. He reports that he does not drink alcohol or use drugs.   Family History: Family History  Problem Relation Age of Onset   Cancer Mother        Thyroid - living in her 7's.   Hypertension Other    Diabetes Other    Stroke Other    Lung cancer Father    CAD Father    Cancer Maternal Grandmother        Breast   Cancer Maternal Grandfather        Throat and stomach   CAD Brother       Physical Exam: Vitals:   12/23/18 1330 12/23/18 1345 12/23/18 1407 12/23/18 1415  BP: 91/75 90/70 (!) 88/64 (!) 123/91  Pulse: 81 84  86  Resp: 19 (!) 24 17 (!) 22  Temp:      TempSrc:      SpO2: 98% 92%  100%    Constitutional: Ill-appearing alert and awake, oriented x3, mild acute distress. Eyes: PERLA, EOMI, irises appear normal, anicteric sclera,  ENMT: external ears and nose appear normal, normal hearing             Lips appears normal, oropharynx mucosa, tongue, posterior pharynx appear normal  Neck: neck appears normal, no masses, normal ROM, no thyromegaly, no JVD  CVS: S1-S2 clear, no murmur rubs or gallops, no LE edema, normal pedal pulses  Respiratory:  clear to  auscultation bilaterally, no wheezing, rales or rhonchi. Respiratory effort normal. No accessory muscle use.  Abdomen: soft nontender, nondistended, normal bowel sounds, no hepatosplenomegaly, no hernias  Musculoskeletal: : no cyanosis, clubbing or edema noted bilaterally Neuro: Cranial nerves II-XII intact, strength, sensation, reflexes Psych: judgement and insight appear normal, stable mood and affect, mental status Skin: no rashes or lesions or ulcers, no induration or nodules   Data reviewed:  I have personally reviewed following labs and imaging studies Labs:  CBC: Recent Labs  Lab 12/23/18 1228  WBC 11.4*  HGB 13.5  HCT 41.6  MCV 84.6  PLT 983    Basic Metabolic Panel: Recent Labs  Lab 12/23/18 1228  NA 134*  K 4.1  CL 96*  CO2  26  GLUCOSE 341*  BUN 15  CREATININE 1.80*  CALCIUM 9.3   GFR Estimated Creatinine Clearance: 50.7 mL/min (A) (by C-G formula based on SCr of 1.8 mg/dL (H)).  Cardiac Enzymes: Recent Labs  Lab 12/23/18 1228  TROPONINI <0.03   BNP: Invalid input(s): POCBNP CBG: No results for input(s): GLUCAP in the last 168 hours. D-Dimer Recent Labs    12/23/18 1311  DDIMER 0.54*   Urinalysis    Component Value Date/Time   COLORURINE YELLOW 11/07/2018 [place] 11/07/2018 1658   LABSPEC 1.010 11/07/2018 1658   PHURINE 5.5 11/07/2018 1658   GLUCOSEU >=500 (A) 11/07/2018 1658   HGBUR NEGATIVE 11/07/2018 1658   BILIRUBINUR NEGATIVE 11/07/2018 1658   KETONESUR NEGATIVE 11/07/2018 1658   PROTEINUR NEGATIVE 11/07/2018 1658   UROBILINOGEN 0.2 02/28/2015 1310   NITRITE NEGATIVE 11/07/2018 1658   LEUKOCYTESUR NEGATIVE 11/07/2018 1658     Microbiology Recent Results (from the past 240 hour(s))  SARS Coronavirus 2 (CEPHEID- Performed in [place] hospital lab), Hosp Order     Status: None   Collection Time: 12/23/18  1:11 PM  Result Value Ref Range Status   SARS Coronavirus 2 NEGATIVE NEGATIVE Final    Comment:  (NOTE) If result is NEGATIVE SARS-CoV-2 target nucleic acids are NOT DETECTED. The SARS-CoV-2 RNA is generally detectable in upper and lower  respiratory specimens during the acute phase of infection. The lowest  concentration of SARS-CoV-2 viral copies this assay can detect is 250  copies / mL. A negative result does not preclude SARS-CoV-2 infection  and should not be used as the sole basis for treatment or other  patient management decisions.  A negative result may occur with  improper specimen collection / handling, submission of specimen other  than nasopharyngeal swab, presence of viral mutation(s) within the  areas targeted by this assay, and inadequate number of viral copies  (<250 copies / mL). A negative result must be combined with clinical  observations, patient history, and epidemiological information. If result is POSITIVE SARS-CoV-2 target nucleic acids are DETECTED. The SARS-CoV-2 RNA is generally detectable in upper and lower  respiratory specimens dur ing the acute phase of infection.  Positive  results are indicative of active infection with SARS-CoV-2.  Clinical  correlation with patient history and other diagnostic information is  necessary to determine patient infection status.  Positive results do  not rule out bacterial infection or co-infection with other viruses. If result is PRESUMPTIVE POSTIVE SARS-CoV-2 nucleic acids MAY BE PRESENT.   A presumptive positive result was obtained on the submitted specimen  and confirmed on repeat testing.  While 2019 novel coronavirus  (SARS-CoV-2) nucleic acids may be present in the submitted sample  additional confirmatory testing may be necessary for epidemiological  and / or clinical management purposes  to differentiate between  SARS-CoV-2 and other Sarbecovirus currently known to infect humans.  If clinically indicated additional testing with an alternate test  methodology [phone]) is advised. The SARS-CoV-2 RNA is  generally  detectable in upper and lower respiratory sp ecimens during the acute  phase of infection. The expected result is Negative. Fact Sheet for Patients:  StrictlyIdeas.no Fact Sheet for Healthcare Providers: BankingDealers.co.za This test is not yet approved or cleared by the [place] FDA and has been authorized for detection and/or diagnosis of SARS-CoV-2 by FDA under an Emergency Use Authorization (EUA).  This EUA will remain in effect (meaning this test can be used) for the duration of the COVID-19  declaration under Section 564(b)(1) of the Act, 21 U.S.C. section 360bbb-3(b)(1), unless the authorization is terminated or revoked sooner. Performed at [facility], [place] [address], [place], [place] [address]        Inpatient Medications:   Scheduled Meds: Continuous Infusions:   Radiological Exams on Admission: Dg Chest 2 View  Result Date: 12/23/2018 CLINICAL DATA:  Shortness of breath and chest heaviness EXAM: CHEST - 2 VIEW COMPARISON:  November 07, 2018 chest radiograph and chest CT November 07, 2018. FINDINGS: There is a small left pleural effusion. There is scarring and atelectasis in the left base region, stable. There is mild atelectasis in the right base. There is no frank airspace consolidation or edema. There is cardiomegaly with pulmonary vascularity normal. No adenopathy. There is aortic atherosclerosis. No evident bone lesions. IMPRESSION: Persistent small left pleural effusion with scarring and atelectasis in the left base. A lesser degree of atelectasis is noted in the right base. No frank edema or consolidation. Stable cardiomegaly. Aortic Atherosclerosis (ICD10-I70.0). Electronically Signed   By: Lowella Grip III M.D.   On: 12/23/2018 14:02    Impression/Recommendations Active Problems:   Sepsis with hypotension ([name])   Chronic respiratory failure with hypoxia ([name])   Diabetes mellitus type 2,  uncontrolled, with cardiovascular complications   COLD (chronic obstructive lung disease) ([place])   Essential hypertension, benign   Tobacco abuse   Patient is a 63 year old male who presents with signs and symptoms consistent with at least mild sepsis with hypotension, elevated white blood cell count shortness of breath, x-ray consistent with pleural effusion and bilateral atelectasis however on last admission his chest x-ray was rather benign and CT scan revealed a pneumonia.  In consultation with Dr. Maurie Boettcher I recommended noncontrast CT scan of the last prior to seeing the patient.  On evaluation of the patient he stated that he was not interested in admission and wanted to be discharged home because his blood pressure was better.  He states that he is a DNR and fully aware of the consequences of not being treated.  I encouraged him to stay because I was very concerned about his hypotension and would need to monitor him however patient is refusing admission. Patient currently retains capacity and we will honor his wishes.  Thank you for this consultation.  Our [facility] hospitalist team will follow the patient with you.   Time Spent: 50 minutes  Lady Deutscher M.D. [facility] 12/23/2018, 2:57 PM

## 2018-12-23 NOTE — ED Notes (Signed)
Patient sts he will contact his family regarding plan of care

## 2018-12-23 NOTE — ED Provider Notes (Addendum)
[facility] Emergency Department Provider Note MRN:  [id number]  Arrival date & time: 12/23/18     Chief Complaint   Shortness of Breath; Congestive Heart Failure; and Hypotension   History of Present Illness   [name] is a 63 y.o. year-old male with a history of COPD, hypertension, diabetes presenting to the ED with chief complaint of hypertension.  Patient endorsing several days of chest pain and shortness of breath.  Sent here by nephrologist for hypotension.  Patient denies any recent changes to his medications, endorsing a fever yesterday, increased cough recently.  Review of Systems  A complete 10 system review of systems was obtained and all systems are negative except as noted in the HPI and PMH.   Patient's Health History    Past Medical History:  Diagnosis Date  . Anxiety   . Arthritis   . Cholecystitis, acute 12/20/2013   Lap chole 6/5  . Cocaine abuse ([place])   . COPD (chronic obstructive pulmonary disease) ([place])   . Coronary atherosclerosis of native coronary artery    a. 03/09/2013 Cath/PCI: LM nl, LAD: 50p, 31m (2.5x16 promus DES), LCX nl, OM1 min irregs, LPL/LPDA diff dzs, RCA nondom, mod diff dzs, EF 55%.  . Depression   . Essential hypertension, benign   . GERD (gastroesophageal reflux disease)   . Headache   . Hepatitis Late 1970s  . History of nephrolithiasis   . History of pneumonia   . History of stroke    Right MCA distribution, residual left-sided weakness  . Hyperlipidemia   . Noncompliance   . Renal cell carcinoma [facility])    Status post radical right nephrectomy August 2015  . Sleep apnea    On CPAP, 4L O2 no cpap at home yet  . Type 2 diabetes mellitus ([place]) 2011    Past Surgical History:  Procedure Laterality Date  . APPENDECTOMY  1970's  . CHOLECYSTECTOMY N/A 12/22/2013   Procedure: LAPAROSCOPIC CHOLECYSTECTOMY ;  Surgeon: [name], MD;  Location: [place];  Service: General;  Laterality: N/A;  . ENDARTERECTOMY  Right 10/08/2014   Procedure: Right ENDARTERECTOMY CAROTID;  Surgeon: [name], MD;  Location: [place];  Service: Vascular;  Laterality: Right;  . LAPAROSCOPIC LYSIS OF ADHESIONS  02/21/2014   Procedure: LAPAROSCOPIC LYSIS OF ADHESIONS EXTINSIVE;  Surgeon: [name], MD;  Location: [facility];  Service: Urology;;  . LEFT HEART CATHETERIZATION WITH CORONARY ANGIOGRAM N/A 06/02/2012   Procedure: LEFT HEART CATHETERIZATION WITH CORONARY ANGIOGRAM;  Surgeon: [name], MD;  Location: [facility] CATH LAB;  Service: Cardiovascular;  Laterality: N/A;  . LEFT HEART CATHETERIZATION WITH CORONARY ANGIOGRAM N/A 03/09/2013   Procedure: LEFT HEART CATHETERIZATION WITH CORONARY ANGIOGRAM;  Surgeon: [name], MD;  Location: [facility] CATH LAB;  Service: Cardiovascular;  Laterality: N/A;  . LEFT HEART CATHETERIZATION WITH CORONARY ANGIOGRAM N/A 12/20/2013   Procedure: LEFT HEART CATHETERIZATION WITH CORONARY ANGIOGRAM;  Surgeon: [name], MD;  Location: [facility] CATH LAB;  Service: Cardiovascular;  Laterality: N/A;  . LUNG BIOPSY Left 05/18/2014   Procedure: LUNG BIOPSY left upper lobe & left lower lobe;  Surgeon: [name], MD;  Location: [place];  Service: Thoracic;  Laterality: Left;  . PATCH ANGIOPLASTY Right 10/08/2014   Procedure: PATCH ANGIOPLASTY Right Carotid;  Surgeon: [name], MD;  Location: [place];  Service: Vascular;  Laterality: Right;  . ROBOT ASSISTED LAPAROSCOPIC NEPHRECTOMY Right 02/21/2014   Procedure: ROBOTIC ASSISTED LAPAROSCOPIC RIGHT NEPHRECTOMY ;  Surgeon: Hubbard Robinson  Tresa Moore, MD;  Location: [facility];  Service: Urology;  Laterality: Right;  [name] VIDEO ASSISTED THORACOSCOPY Left 05/18/2014   Procedure: LEFT VIDEO ASSISTED THORACOSCOPY;  Surgeon: [name], MD;  Location: [place];  Service: Thoracic;  Laterality: Left;  [name] VIDEO BRONCHOSCOPY  05/18/2014   Procedure: VIDEO BRONCHOSCOPY;  Surgeon: [name], MD;  Location: [place];  Service: Thoracic;;    Family History   Problem Relation Age of Onset  . Cancer Mother        Thyroid - living in her 51's.  [name] Hypertension Other   . Diabetes Other   . Stroke Other   . Lung cancer Father   . CAD Father   . Cancer Maternal Grandmother        Breast  . Cancer Maternal Grandfather        Throat and stomach  . CAD Brother     Social History   Socioeconomic History  . Marital status: Single    Spouse name: Not on file  . Number of children: Not on file  . Years of education: Not on file  . Highest education level: Not on file  Occupational History  . Occupation: N/A  Social Needs  . Financial resource strain: Not on file  . Food insecurity:    Worry: Not on file    Inability: Not on file  . Transportation needs:    Medical: Not on file    Non-medical: Not on file  Tobacco Use  . Smoking status: Current Some Day Smoker    Packs/day: 1.00    Years: 41.00    Pack years: 41.00    Types: Cigarettes    Start date: 05/03/1972  . Smokeless tobacco: Never Used  . Tobacco comment: 2cigs per day as of 08/01/2018  Substance and Sexual Activity  . Alcohol use: No    Alcohol/week: 0.0 standard drinks  . Drug use: No    Types: Cocaine    Comment: last used cocaine 11/2013. but is cocaine + this admit July 2015  . Sexual activity: Never  Lifestyle  . Physical activity:    Days per week: Not on file    Minutes per session: Not on file  . Stress: Not on file  Relationships  . Social connections:    Talks on phone: Not on file    Gets together: Not on file    Attends religious service: Not on file    Active member of club or organization: Not on file    Attends meetings of clubs or organizations: Not on file    Relationship status: Not on file  . Intimate partner violence:    Fear of current or ex partner: Not on file    Emotionally abused: Not on file    Physically abused: Not on file    Forced sexual activity: Not on file  Other Topics Concern  . Not on file  Social History Narrative   Lives  in [place] with his mother.  He does not work.     Physical Exam  Vital Signs and Nursing Notes reviewed Vitals:   12/23/18 1407 12/23/18 1415  BP: (!) 88/64 (!) 123/91  Pulse:  86  Resp: 17 (!) 22  Temp:    SpO2:  100%    CONSTITUTIONAL: Chronically ill-appearing, NAD NEURO:  Alert and oriented x 3, no focal deficits EYES:  eyes equal and reactive ENT/NECK:  no LAD, no JVD CARDIO: Regular rate, well-perfused, normal S1 and S2 PULM:  CTAB no wheezing or rhonchi GI/GU:  normal bowel sounds, non-distended, non-tender MSK/SPINE:  No gross deformities, no edema SKIN:  no rash, atraumatic PSYCH:  Appropriate speech and behavior  Diagnostic and Interventional Summary    EKG Interpretation  Date/Time:  Friday December 23 2018 12:46:46 EDT Ventricular Rate:  91 PR Interval:    QRS Duration: 88 QT Interval:  333 QTC Calculation: 410 R Axis:   -46 Text Interpretation:  Sinus rhythm Atrial premature complexes Left anterior fascicular block Anteroseptal infarct, old Artifact in lead(s) I II III aVR aVL aVF V3 V4 V5 V6 Confirmed by Gerlene Fee [id number]) on 12/23/2018 1:20:58 PM      Labs Reviewed  BASIC METABOLIC PANEL - Abnormal; Notable for the following components:      Result Value   Sodium 134 (*)    Chloride 96 (*)    Glucose, Bld 341 (*)    Creatinine, Ser 1.80 (*)    GFR calc non Af Amer 39 (*)    GFR calc Af Amer 45 (*)    All other components within normal limits  CBC - Abnormal; Notable for the following components:   WBC 11.4 (*)    All other components within normal limits  D-DIMER, QUANTITATIVE (NOT AT [facility]) - Abnormal; Notable for the following components:   D-Dimer, Quant 0.54 (*)    All other components within normal limits  LACTIC ACID, PLASMA - Abnormal; Notable for the following components:   Lactic Acid, Venous 3.1 (*)    All other components within normal limits  SARS CORONAVIRUS 2 (HOSPITAL ORDER, [facility])  CULTURE, BLOOD  (ROUTINE X 2)  CULTURE, BLOOD (ROUTINE X 2)  TROPONIN I  BRAIN NATRIURETIC PEPTIDE  URINALYSIS, ROUTINE W REFLEX MICROSCOPIC    DG Chest 2 View  Final Result    CT Chest Wo Contrast    (Results Pending)    Medications  sodium chloride flush (NS) 0.9 % injection 3 mL (3 mLs Intravenous Given 12/23/18 1312)  sodium chloride 0.9 % bolus 500 mL (0 mLs Intravenous Stopped 12/23/18 1337)  sodium chloride 0.9 % bolus 500 mL (500 mLs Intravenous New Bag/Given 12/23/18 1421)     Procedures   EMERGENCY DEPARTMENT [place] CARDIAC EXAM "Study: Limited Ultrasound of the Heart and Pericardium"  INDICATIONS:Abnormal vital signs, Chest pain and Dyspnea Multiple views of the heart and pericardium were obtained in real-time with a multi-frequency probe.  PERFORMED WE:XHBZJI IMAGES ARCHIVED?: Yes LIMITATIONS:  Body habitus VIEWS USED: Parasternal long axis, Parasternal short axis and Apical 4 chamber  INTERPRETATION: Possible trace pericardial effusion, hyperdynamic motion of the heart   Critical Care Critical Care Documentation Critical care time provided by me (excluding procedures): 34 minutes  Condition necessitating critical care: Hypotension, concern for hypovolemic shock  Components of critical care management: reviewing of prior records, laboratory and imaging interpretation, frequent re-examination and reassessment of vital signs, administration of IV fluids, discussion with consulting services.    ED Course and Medical Decision Making  I have reviewed the triage vital signs and the nursing notes.  Pertinent labs & imaging results that were available during my care of the patient were reviewed by me and considered in my medical decision making (see below for details).  Arrival blood pressure in the 96V systolic in the 63 year old male with history of near end-stage emphysema on 6 L nasal cannula at baseline.  Patient endorsing chest pain and shortness of breath, no significant lower  extremity edema, bedside ultrasound  is more consistent with a hyperdynamic heart and possible intravascular depletion.  500 cc fluid bolus started, already seems to be helping his blood pressure which is now in the 62E systolic.  Considering dehydration, sepsis given his reported fever yesterday with increased cough, coronavirus, it is doubtful that this trace pericardial effusion is the cause of patient's abnormal vital signs.  Also considering ACS, PE, troponin and d-dimer pending.  Discussed case with hospitalist, who will admit the patient.  Will obtain CT chest without contrast to evaluate for pneumonia as this is the mode of imaging required 2 diagnoses pneumonia during his last admission.  Patient is negative for COVID-19.  [name] was evaluated in Emergency Department on 12/23/2018 for the symptoms described in the history of present illness. He was evaluated in the context of the global COVID-19 pandemic, which necessitated consideration that the patient might be at risk for infection with the SARS-CoV-2 virus that causes COVID-19. Institutional protocols and algorithms that pertain to the evaluation of patients at risk for COVID-19 are in a state of rapid change based on information released by regulatory bodies including the CDC and federal and state organizations. These policies and algorithms were followed during the patient's care in the ED.  Update 3:08 PM: Patient is now refusing admission, patient has been made aware of the risks of continued infection, low blood pressure, sepsis, death at home.  Patient is aware of these risks, has capacity to make decisions, explains that he is DNR and is not wanting to be in the hospital despite these risks.  Provided with prescription for levofloxacin to empirically cover for pneumonia.   Barth Kirks. Sedonia Small, MD [place] [email or web address]  Final Clinical Impressions(s) / ED Diagnoses      ICD-10-CM   1. Dehydration E86.0   2. Lactic acidosis E87.2   3. Dyspnea, unspecified type R06.00   4. Chest pain, unspecified type R07.9   5. Shock [facility]) R57.9     ED Discharge Orders    None         Maudie Flakes, MD 12/23/18 1445    Maudie Flakes, MD 12/23/18 [phone]

## 2018-12-23 NOTE — ED Notes (Signed)
Pt left AMA. Pt discharged with all belongings. Discharge instructions reviewed with pt. Pt verbalized understanding. Opportunity for questions provided.

## 2018-12-23 NOTE — Discharge Instructions (Addendum)
You are leaving the hospital [place].  We are concern for pneumonia and a dangerous infection called sepsis.  Your blood pressure was low and it would be best for you to stay in the hospital.  Still you are choosing to leave.  You can return to the emergency department at any time for continued care.  Please take the antibiotics as directed.

## 2018-12-26 NOTE — Telephone Encounter (Signed)
Saw encounter from Dr. Domenic Polite and they were aware that pt was sent to ED after visit with TP 6/4. Nothing further needed.

## 2018-12-28 LAB — CULTURE, BLOOD (ROUTINE X 2)
Culture: NO GROWTH
Culture: NO GROWTH
Special Requests: ADEQUATE

## 2019-01-04 ENCOUNTER — Other Ambulatory Visit: Payer: Self-pay | Admitting: Cardiology

## 2019-01-26 ENCOUNTER — Other Ambulatory Visit: Payer: Self-pay | Admitting: Cardiology

## 2019-02-14 ENCOUNTER — Other Ambulatory Visit: Payer: Self-pay | Admitting: Cardiology

## 2019-02-16 ENCOUNTER — Telehealth: Payer: Self-pay | Admitting: Internal Medicine

## 2019-02-16 NOTE — Telephone Encounter (Signed)
Spoke with the pt  He states he has appt Monday and wants to discuss changing o2 companies at that time  He does not like [employer] b/c they will not deliver his backup tanks  I will add to appt note that o2 recert will be needed for changing DME

## 2019-02-20 ENCOUNTER — Ambulatory Visit (INDEPENDENT_AMBULATORY_CARE_PROVIDER_SITE_OTHER): Payer: Medicaid Other

## 2019-02-20 ENCOUNTER — Ambulatory Visit: Payer: Medicaid Other | Admitting: Nurse Practitioner

## 2019-02-20 ENCOUNTER — Other Ambulatory Visit: Payer: Self-pay

## 2019-02-20 ENCOUNTER — Encounter: Payer: Self-pay | Admitting: Nurse Practitioner

## 2019-02-20 VITALS — BP 98/66 | HR 96 | Temp 98.3°F | Ht 69.0 in | Wt 239.0 lb

## 2019-02-20 DIAGNOSIS — J9611 Chronic respiratory failure with hypoxia: Secondary | ICD-10-CM

## 2019-02-20 DIAGNOSIS — R0602 Shortness of breath: Secondary | ICD-10-CM | POA: Diagnosis not present

## 2019-02-20 DIAGNOSIS — Z720 Tobacco use: Secondary | ICD-10-CM

## 2019-02-20 DIAGNOSIS — J441 Chronic obstructive pulmonary disease with (acute) exacerbation: Secondary | ICD-10-CM

## 2019-02-20 DIAGNOSIS — J849 Interstitial pulmonary disease, unspecified: Secondary | ICD-10-CM | POA: Diagnosis not present

## 2019-02-20 DIAGNOSIS — J438 Other emphysema: Secondary | ICD-10-CM

## 2019-02-20 MED ORDER — DOXYCYCLINE HYCLATE 100 MG PO TABS: 100.0000 mg | ORAL_TABLET | Freq: Two times a day (BID) | ORAL | 0 refills | Status: DC

## 2019-02-20 NOTE — Patient Instructions (Addendum)
Will order doxycycline Will check chest x ray and call with results  Glad that you have cut back on smoking  - Please try to quit completely  Continue O2 continuously as ordered  Follow up: Follow up at first available in ILD clinic with Dr. Chase Caller

## 2019-02-20 NOTE — Assessment & Plan Note (Addendum)
Patient is complaint with oxygen use. O2 Sats are stable. Patient continues on 6 L of oxygen continuously.  He has been having issues with getting new tanks out to his home and supplies.  He currently uses [employer] for DME company.  [place] and supplies will be sent out to patient.   Patient Instructions  Will order doxycycline Will check chest x ray and call with results  Glad that you have cut back on smoking  - Please try to quit completely  Continue O2 continuously as ordered  Follow up: Follow up at first available in ILD clinic with Dr. Chase Caller

## 2019-02-20 NOTE — Assessment & Plan Note (Signed)
Patient presents today for follow-up, new chest congestion, issues with getting O2 supplies.  Patient was last seen by Tammy on 12/22/2018 and was advised to go to the ED.  Patient did go to the ED on 12/23/2018, but did leave the hospital AMA.  Patient states that he has been doing well since hospital discharge but over the past week has developed new chest congestion with right side chest pain.  He states that symptoms are slowly worsening.  He denies any productive cough.  Patient Instructions  Will order doxycycline Will check chest x ray and call with results  Glad that you have cut back on smoking  - Please try to quit completely  Continue O2 continuously as ordered  Follow up: Follow up at first available in ILD clinic with Dr. Chase Caller

## 2019-02-20 NOTE — Assessment & Plan Note (Signed)
Unfortunately, patient continues to smoke daily.  He states that he is down to 3 or 4 cigarettes/day. Smoking cessation instruction/counseling given:  counseled patient on the dangers of tobacco use, advised patient to stop smoking, and reviewed strategies to maximize success   Patient Instructions  Will order doxycycline Will check chest x ray and call with results  Glad that you have cut back on smoking  - Please try to quit completely  Continue O2 continuously as ordered  Follow up: Follow up at first available in ILD clinic with Dr. Chase Caller

## 2019-02-20 NOTE — Progress Notes (Signed)
@Patient  ID: [name], male    DOB: [birth year], 64 y.o.   MRN: [id number]  Chief Complaint  Patient presents with  . Follow-up    Referring provider: Camille Bal, PA-C  HPI 63 year old male active smoker followed for interstitial lung disease with emphysema.  Surgical lung biopsy 2015 read as DIP, reinterpreted February 2020 at [facility] felt to be smoking-related interstitial fibrosis with associated emphysema.,  Chronic respiratory failure on oxygen Medical history significant for chronic pain  Tests: CT chest 11/07/18 - New small focal area of peripheral ground-glass density in the anterior right upper lobe is likely infectious or inflammatory. Unchanged pleuroparenchymal scarring and fibrosis in the mid to lower left hemithorax. Emphysema.  CXR 12/23/18 - Persistent small left pleural effusion with scarring and atelectasis in the left base. A lesser degree of atelectasis is noted in the right base. No frank edema or consolidation. Stable cardiomegaly.  PFT Results Latest Ref Rng & Units 09/08/2018 09/07/2014 04/13/2014  FVC-Pre L 3.80 4.03 4.37  FVC-Predicted Pre % 81 83 90  FVC-Post L - 4.29 4.43  FVC-Predicted Post % - 89 92  Pre FEV1/FVC % % 76 81 82  Post FEV1/FCV % % - 83 83  FEV1-Pre L 2.88 3.28 3.58  FEV1-Predicted Pre % 82 90 97  FEV1-Post L - 3.58 3.67  DLCO UNC% % 51 50 55  DLCO COR %Predicted % 62 57 63  TLC L - 6.36 6.11  TLC % Predicted % - 90 87  RV % Predicted % - 85 76      OV 02/20/19 - follow up Patient presents today for follow-up, new chest congestion, issues with getting O2 supplies.  Patient was last seen by Tammy on 12/22/2018 and was advised to go to the ED.  Patient did go to the ED on 12/23/2018, but did leave the hospital AMA.  Patient states that he has been doing well since hospital discharge but over the past week has developed new chest congestion with right side chest pain.  He states that symptoms are slowly worsening.  He denies any  productive cough.  Denies any recent fever.  Patient continues on 6 L of oxygen continuously.  He has been having issues with getting new tanks out to his home and supplies.  He currently uses [employer] for DME company.  Unfortunately, patient continues to smoke daily.  He states that he is down to 3 or 4 cigarettes/day. Denies f/c/s, n/v/d, hemoptysis, PND, leg swelling.     Allergies  Allergen Reactions  . Oxycodone Itching and Nausea Only    Pt is able to tolerate if taken with benadryl    Immunization History  Administered Date(s) Administered  . Influenza Split 04/04/2014, 03/27/2015  . Pneumococcal Polysaccharide-23 11/04/2012    Past Medical History:  Diagnosis Date  . Anxiety   . Arthritis   . Cholecystitis, acute 12/20/2013   Lap chole 6/5  . Cocaine abuse ([place])   . COPD (chronic obstructive pulmonary disease) ([place])   . Coronary atherosclerosis of native coronary artery    a. 03/09/2013 Cath/PCI: LM nl, LAD: 50p, 28m (2.5x16 promus DES), LCX nl, OM1 min irregs, LPL/LPDA diff dzs, RCA nondom, mod diff dzs, EF 55%.  . Depression   . Essential hypertension, benign   . GERD (gastroesophageal reflux disease)   . Headache   . Hepatitis Late 1970s  . History of nephrolithiasis   . History of pneumonia   . History of stroke  Right MCA distribution, residual left-sided weakness  . Hyperlipidemia   . Noncompliance   . Renal cell carcinoma [facility])    Status post radical right nephrectomy August 2015  . Sleep apnea    On CPAP, 4L O2 no cpap at home yet  . Type 2 diabetes mellitus ([place]) 2011    Tobacco History: Social History   Tobacco Use  Smoking Status Current Some Day Smoker  . Packs/day: 1.00  . Years: 41.00  . Pack years: 41.00  . Types: Cigarettes  . Start date: 05/03/1972  Smokeless Tobacco Never Used  Tobacco Comment   2cigs per day as of 08/01/2018   Ready to quit: Not Answered Counseling given: Not Answered Comment: 2cigs per day as of 08/01/2018    Outpatient Encounter Medications as of 02/20/2019  Medication Sig  . ALPRAZolam (XANAX) 0.5 MG tablet Take 1 tablet (0.5 mg total) by mouth 3 (three) times daily as needed for anxiety.  [name] atorvastatin (LIPITOR) 20 MG tablet Take 20 mg by mouth at bedtime.  . clopidogrel (PLAVIX) 75 MG tablet TAKE ONE TABLET BY MOUTH DAILY  . diltiazem (CARDIZEM CD) 240 MG 24 hr capsule Take 240 mg by mouth daily.  [name] docusate sodium (COLACE) 100 MG capsule Take 100 mg by mouth at bedtime.  [name] FLUoxetine (PROZAC) 10 MG capsule Take 10 mg by mouth every morning. Take with 40 mg capsule = 50 mg total  . FLUoxetine (PROZAC) 40 MG capsule Take 40 mg by mouth daily. Take with 10 mg capsule = 50 mg total  . gabapentin (NEURONTIN) 300 MG capsule Take 1 capsule (300 mg total) by mouth 3 (three) times daily. (Patient taking differently: Take 900 mg by mouth 3 (three) times daily. )  . Glycopyrrolate-Formoterol (BEVESPI AEROSPHERE) 9-4.8 MCG/ACT AERO Inhale 2 puffs into the lungs 2 (two) times daily.  [name] ibuprofen (ADVIL) 200 MG tablet Take 400 mg by mouth every 6 (six) hours as needed for fever, headache or mild pain.  [name] insulin aspart (NOVOLOG) 100 UNIT/ML injection Inject 22 Units into the skin 3 (three) times daily before meals.   . isosorbide mononitrate (IMDUR) 60 MG 24 hr tablet TAKE ONE TABLET BY MOUTH DAILY  . LANTUS SOLOSTAR 100 UNIT/ML Solostar Pen Inject 112 Units into the skin 2 (two) times daily.   [name] linagliptin (TRADJENTA) 5 MG TABS tablet Take 5 mg by mouth daily.  [name] LORazepam (ATIVAN) 1 MG tablet Take 5 mg by mouth at bedtime.  . nicotine (NICODERM CQ - DOSED IN MG/24 HOURS) 14 mg/24hr patch Place 1 patch (14 mg total) onto the skin daily.  . nitroGLYCERIN (NITROSTAT) 0.4 MG SL tablet Place 1 tablet (0.4 mg total) under the tongue every 5 (five) minutes as needed for chest pain. PLACE ONE TABLET UNDER THE TONGUE EVERY FIVE MINUTES AS NEEDED FOR CHEST PAIN. IF 3 TABLETS ARE NECESSARY CALL 911  . oxybutynin  (DITROPAN-XL) 10 MG 24 hr tablet Take 10 mg by mouth daily.  [name] oxyCODONE-acetaminophen (PERCOCET) 10-325 MG tablet Take 1 tablet by mouth every 6 (six) hours as needed for pain.  . pantoprazole (PROTONIX) 40 MG tablet Take 40 mg by mouth daily.  . potassium chloride SA (K-DUR,KLOR-CON) 20 MEQ tablet Take 20 mEq by mouth 3 (three) times daily.  . Probiotic Product (PROBIOTIC PO) Take 1 capsule by mouth daily.  . tamsulosin (FLOMAX) 0.4 MG CAPS capsule Take 0.4 mg by mouth daily.   [name] torsemide (DEMADEX) 20 MG tablet TAKE THREE TABLETS BY MOUTH  TWICE DAILY (Patient taking differently: Take 60 mg by mouth 2 (two) times daily. )  . doxycycline (VIBRA-TABS) 100 MG tablet Take 1 tablet (100 mg total) by mouth 2 (two) times daily.   No facility-administered encounter medications on file as of 02/20/2019.      Review of Systems  Review of Systems  Constitutional: Negative.  Negative for chills and fever.  HENT: Negative.   Respiratory: Positive for shortness of breath. Negative for cough and wheezing.   Cardiovascular: Negative.  Negative for chest pain, palpitations and leg swelling.  Gastrointestinal: Negative.   Allergic/Immunologic: Negative.   Neurological: Negative.   Psychiatric/Behavioral: Negative.        Physical Exam  BP 98/66 (BP Location: Left Arm, Patient Position: Sitting, Cuff Size: Normal)   Pulse 96   Temp 98.3 F (36.8 C)   Ht 5\' 9"  (1.753 m)   Wt 239 lb (108.4 kg)   SpO2 94% Comment: on 6L of O2 continuous  BMI 35.29 kg/m   Wt Readings from Last 5 Encounters:  02/20/19 239 lb (108.4 kg)  12/22/18 236 lb 3.2 oz (107.1 kg)  11/08/18 235 lb 3.7 oz (106.7 kg)  09/08/18 236 lb (107 kg)  09/07/18 234 lb (106.1 kg)     Physical Exam Vitals signs and nursing note reviewed.  Constitutional:      General: He is not in acute distress.    Appearance: He is well-developed.  Cardiovascular:     Rate and Rhythm: Normal rate and regular rhythm.  Pulmonary:      Effort: Pulmonary effort is normal. No respiratory distress.     Breath sounds: Normal breath sounds. No wheezing or rhonchi.  Musculoskeletal:        General: No swelling.  Skin:    General: Skin is warm and dry.  Neurological:     Mental Status: He is alert and oriented to person, place, and time.       Assessment & Plan:   Chronic respiratory failure with hypoxia [facility]) Patient is complaint with oxygen use. O2 Sats are stable. Patient continues on 6 L of oxygen continuously.  He has been having issues with getting new tanks out to his home and supplies.  He currently uses [employer] for DME company.  [place] and supplies will be sent out to patient.   Patient Instructions  Will order doxycycline Will check chest x ray and call with results  Glad that you have cut back on smoking  - Please try to quit completely  Continue O2 continuously as ordered  Follow up: Follow up at first available in ILD clinic with Dr. Chase Caller    Other emphysema Patient presents today for follow-up, new chest congestion, issues with getting O2 supplies.  Patient was last seen by Tammy on 12/22/2018 and was advised to go to the ED.  Patient did go to the ED on 12/23/2018, but did leave the hospital AMA.  Patient states that he has been doing well since hospital discharge but over the past week has developed new chest congestion with right side chest pain.  He states that symptoms are slowly worsening.  He denies any productive cough.  Patient Instructions  Will order doxycycline Will check chest x ray and call with results  Glad that you have cut back on smoking  - Please try to quit completely  Continue O2 continuously as ordered  Follow up: Follow up at first available in ILD clinic with Dr. Chase Caller    Tobacco  abuse Unfortunately, patient continues to smoke daily.  He states that he is down to 3 or 4 cigarettes/day. Smoking cessation instruction/counseling given:  counseled patient  on the dangers of tobacco use, advised patient to stop smoking, and reviewed strategies to maximize success   Patient Instructions  Will order doxycycline Will check chest x ray and call with results  Glad that you have cut back on smoking  - Please try to quit completely  Continue O2 continuously as ordered  Follow up: Follow up at first available in ILD clinic with Dr. Arman Bogus, NP 02/20/2019

## 2019-02-23 ENCOUNTER — Telehealth: Payer: Self-pay | Admitting: Nurse Practitioner

## 2019-02-23 NOTE — Telephone Encounter (Signed)
Please reach out to [facility] as she is still checking her inbox according to Jackson Latino  Let me know if you do not hear back from her .   Please contact office for sooner follow up if symptoms do not improve or worsen or seek emergency care

## 2019-02-23 NOTE — Telephone Encounter (Signed)
Patient calling for Xray results from 02/20/19. Doesn't look like they have been resulted at this time. Will sent to provider to take a look at them.  TP please advise.

## 2019-02-24 NOTE — Telephone Encounter (Signed)
Spoke with the pt and notified him of his CXR results and he verbalized understanding

## 2019-02-24 NOTE — Telephone Encounter (Signed)
Tonya, please advise on the results of pt's cxr which was performed 8/3. Thanks!

## 2019-02-24 NOTE — Telephone Encounter (Signed)
No acute findings. Thanks.

## 2019-03-01 ENCOUNTER — Telehealth: Payer: Self-pay | Admitting: Cardiology

## 2019-03-01 NOTE — Telephone Encounter (Signed)
Virtual Visit Pre-Appointment Phone Call  "(Name), I am calling you today to discuss your upcoming appointment. We are currently trying to limit exposure to the virus that causes COVID-19 by seeing patients at home rather than in the office."  1. "What is the BEST phone number to call the day of the visit?" - include this in appointment notes  2. Do you have or have access to (through a family member/friend) a smartphone with video capability that we can use for your visit?" a. If yes - list this number in appt notes as cell (if different from BEST phone #) and list the appointment type as a VIDEO visit in appointment notes b. If no - list the appointment type as a PHONE visit in appointment notes  3. Confirm consent - "In the setting of the current Covid19 crisis, you are scheduled for a (phone or video) visit with your provider on (date) at (time).  Just as we do with many in-office visits, in order for you to participate in this visit, we must obtain consent.  If you'd like, I can send this to your mychart (if signed up) or email for you to review.  Otherwise, I can obtain your verbal consent now.  All virtual visits are billed to your insurance company just like a normal visit would be.  By agreeing to a virtual visit, we'd like you to understand that the technology does not allow for your provider to perform an examination, and thus may limit your provider's ability to fully assess your condition. If your provider identifies any concerns that need to be evaluated in person, we will make arrangements to do so.  Finally, though the technology is pretty good, we cannot assure that it will always work on either your or our end, and in the setting of a video visit, we may have to convert it to a phone-only visit.  In either situation, we cannot ensure that we have a secure connection.  Are you willing to proceed?" STAFF: Did the patient verbally acknowledge consent to telehealth visit? Document  YES/NO here: yes  4. Advise patient to be prepared - "Two hours prior to your appointment, go ahead and check your blood pressure, pulse, oxygen saturation, and your weight (if you have the equipment to check those) and write them all down. When your visit starts, your provider will ask you for this information. If you have an Apple Watch or Kardia device, please plan to have heart rate information ready on the day of your appointment. Please have a pen and paper handy nearby the day of the visit as well."  5. Give patient instructions for MyChart download to smartphone OR [email or web address] as below if video visit (depending on what platform provider is using)  6. Inform patient they will receive a phone call 15 minutes prior to their appointment time (may be from unknown caller ID) so they should be prepared to answer    TELEPHONE CALL NOTE  [name] has been deemed a candidate for a follow-up tele-health visit to limit community exposure during the Covid-19 pandemic. I spoke with the patient via phone to ensure availability of phone/video source, confirm preferred email & phone number, and discuss instructions and expectations.  I reminded OSBORN PULLIN to be prepared with any vital sign and/or heart rhythm information that could potentially be obtained via home monitoring, at the time of his visit. I reminded PEARL BERLINGER to expect a phone call prior to  his visit.  Weston Anna 03/01/2019 9:42 AM   INSTRUCTIONS FOR DOWNLOADING THE MYCHART APP TO SMARTPHONE  - The patient must first make sure to have activated MyChart and know their login information - If Apple, go to [employer] and type in MyChart in the search bar and download the app. If Android, ask patient to go to [employer] and type in [place] in the search bar and download the app. The app is free but as with any other app downloads, their phone may require them to verify saved payment information or Apple/Android  password.  - The patient will need to then log into the app with their MyChart username and password, and select [facility] as their healthcare provider to link the account. When it is time for your visit, go to the MyChart app, find appointments, and click Begin Video Visit. Be sure to Select Allow for your device to access the Microphone and Camera for your visit. You will then be connected, and your provider will be with you shortly.  **If they have any issues connecting, or need assistance please contact MyChart service desk [phone])**  **If using a computer, in order to ensure the best quality for their visit they will need to use either of the following Internet Browsers: [employer], or Google Chrome**  IF USING DOXIMITY or DOXY.ME - The patient will receive a link just prior to their visit by text.     FULL LENGTH CONSENT FOR TELE-HEALTH VISIT   I hereby voluntarily request, consent and authorize [facility] and its employed or contracted physicians, physician assistants, nurse practitioners or other licensed health care professionals (the Practitioner), to provide me with telemedicine health care services (the Services") as deemed necessary by the treating Practitioner. I acknowledge and consent to receive the Services by the Practitioner via telemedicine. I understand that the telemedicine visit will involve communicating with the Practitioner through live audiovisual communication technology and the disclosure of certain medical information by electronic transmission. I acknowledge that I have been given the opportunity to request an in-person assessment or other available alternative prior to the telemedicine visit and am voluntarily participating in the telemedicine visit.  I understand that I have the right to withhold or withdraw my consent to the use of telemedicine in the course of my care at any time, without affecting my right to future care or treatment,  and that the Practitioner or I may terminate the telemedicine visit at any time. I understand that I have the right to inspect all information obtained and/or recorded in the course of the telemedicine visit and may receive copies of available information for a reasonable fee.  I understand that some of the potential risks of receiving the Services via telemedicine include:   Delay or interruption in medical evaluation due to technological equipment failure or disruption;  Information transmitted may not be sufficient (e.g. poor resolution of images) to allow for appropriate medical decision making by the Practitioner; and/or   In rare instances, security protocols could fail, causing a breach of personal health information.  Furthermore, I acknowledge that it is my responsibility to provide information about my medical history, conditions and care that is complete and accurate to the best of my ability. I acknowledge that Practitioner's advice, recommendations, and/or decision may be based on factors not within their control, such as incomplete or inaccurate data provided by me or distortions of diagnostic images or specimens that may result from electronic transmissions. I  understand that the practice of medicine is not an exact science and that Practitioner makes no warranties or guarantees regarding treatment outcomes. I acknowledge that I will receive a copy of this consent concurrently upon execution via email to the email address I last provided but may also request a printed copy by calling the office of [facility].    I understand that my insurance will be billed for this visit.   I have read or had this consent read to me.  I understand the contents of this consent, which adequately explains the benefits and risks of the Services being provided via telemedicine.   I have been provided ample opportunity to ask questions regarding this consent and the Services and have had my questions  answered to my satisfaction.  I give my informed consent for the services to be provided through the use of telemedicine in my medical care  By participating in this telemedicine visit I agree to the above.

## 2019-03-07 NOTE — Progress Notes (Signed)
Virtual Visit via Telephone Note   This visit type was conducted due to national recommendations for restrictions regarding the COVID-19 Pandemic (e.g. social distancing) in an effort to limit this patient's exposure and mitigate transmission in our community.  Due to his co-morbid illnesses, this patient is at least at moderate risk for complications without adequate follow up.  This format is felt to be most appropriate for this patient at this time.  The patient did not have access to video technology/had technical difficulties with video requiring transitioning to audio format only (telephone).  All issues noted in this document were discussed and addressed.  No physical exam could be performed with this format.  Please refer to the patient's chart for his  consent to telehealth for [facility].  Date:  03/08/2019   ID:  [name], [name] [birth year], MRN [id number]  Patient Location: Home Provider Location: Office  PCP:  Camille Bal, PA-C  Cardiologist:  [name], MD Electrophysiologist:  None   Evaluation Performed:  Follow-Up Visit  Chief Complaint:   Cardiac follow-up  History of Present Illness:    [name] is a 63 y.o. male last seen in February.  He did not have video access and we spoke by phone today.  He is at home with home health nursing.  He states that his breathlessness has been worse over the last few months.  Follow-up chest x-ray in early August showed no acute infiltrates with chronic findings.  He does not report any fevers or chills, still on oxygen at 6 L.  Also states that blood pressure has been running low over several weeks.  He continues to follow with Pulmonary for management of chronic hypoxic respiratory failure in the setting of ILD.  I reviewed his cardiac medications which include Plavix, Lipitor, Cardizem CD, Demadex with potassium supplements, and Imdur.  He describes intermittent angina symptoms, not progressive.  The  patient does not have symptoms concerning for COVID-19 infection (fever, chills, cough, or new shortness of breath).    Past Medical History:  Diagnosis Date  . Anxiety   . Arthritis   . Cholecystitis, acute 12/20/2013   Lap chole 6/5  . Cocaine abuse ([place])   . COPD (chronic obstructive pulmonary disease) ([place])   . Coronary atherosclerosis of native coronary artery    a. 03/09/2013 Cath/PCI: LM nl, LAD: 50p, 73m (2.5x16 promus DES), LCX nl, OM1 min irregs, LPL/LPDA diff dzs, RCA nondom, mod diff dzs, EF 55%.  . Depression   . Essential hypertension, benign   . GERD (gastroesophageal reflux disease)   . Headache   . Hepatitis Late 1970s  . History of nephrolithiasis   . History of pneumonia   . History of stroke    Right MCA distribution, residual left-sided weakness  . Hyperlipidemia   . Noncompliance   . Renal cell carcinoma [facility])    Status post radical right nephrectomy August 2015  . Sleep apnea    On CPAP, 4L O2 no cpap at home yet  . Type 2 diabetes mellitus ([name]) 2011   Past Surgical History:  Procedure Laterality Date  . APPENDECTOMY  1970's  . CHOLECYSTECTOMY N/A 12/22/2013   Procedure: LAPAROSCOPIC CHOLECYSTECTOMY ;  Surgeon: [name], MD;  Location: [place];  Service: General;  Laterality: N/A;  . ENDARTERECTOMY Right 10/08/2014   Procedure: Right ENDARTERECTOMY CAROTID;  Surgeon: [name], MD;  Location: [place];  Service: Vascular;  Laterality: Right;  . LAPAROSCOPIC LYSIS OF  ADHESIONS  02/21/2014   Procedure: LAPAROSCOPIC LYSIS OF ADHESIONS EXTINSIVE;  Surgeon: [name], MD;  Location: [facility];  Service: Urology;;  . LEFT HEART CATHETERIZATION WITH CORONARY ANGIOGRAM N/A 06/02/2012   Procedure: LEFT HEART CATHETERIZATION WITH CORONARY ANGIOGRAM;  Surgeon: [name], MD;  Location: [facility] CATH LAB;  Service: Cardiovascular;  Laterality: N/A;  . LEFT HEART CATHETERIZATION WITH CORONARY ANGIOGRAM N/A 03/09/2013   Procedure: LEFT HEART CATHETERIZATION WITH  CORONARY ANGIOGRAM;  Surgeon: [name], MD;  Location: [facility] CATH LAB;  Service: Cardiovascular;  Laterality: N/A;  . LEFT HEART CATHETERIZATION WITH CORONARY ANGIOGRAM N/A 12/20/2013   Procedure: LEFT HEART CATHETERIZATION WITH CORONARY ANGIOGRAM;  Surgeon: [name], MD;  Location: [facility] CATH LAB;  Service: Cardiovascular;  Laterality: N/A;  . LUNG BIOPSY Left 05/18/2014   Procedure: LUNG BIOPSY left upper lobe & left lower lobe;  Surgeon: [name], MD;  Location: [place];  Service: Thoracic;  Laterality: Left;  . PATCH ANGIOPLASTY Right 10/08/2014   Procedure: PATCH ANGIOPLASTY Right Carotid;  Surgeon: [name], MD;  Location: [place];  Service: Vascular;  Laterality: Right;  . ROBOT ASSISTED LAPAROSCOPIC NEPHRECTOMY Right 02/21/2014   Procedure: ROBOTIC ASSISTED LAPAROSCOPIC RIGHT NEPHRECTOMY ;  Surgeon: [name], MD;  Location: [facility];  Service: Urology;  Laterality: Right;  [name] VIDEO ASSISTED THORACOSCOPY Left 05/18/2014   Procedure: LEFT VIDEO ASSISTED THORACOSCOPY;  Surgeon: [name], MD;  Location: [place];  Service: Thoracic;  Laterality: Left;  [name] VIDEO BRONCHOSCOPY  05/18/2014   Procedure: VIDEO BRONCHOSCOPY;  Surgeon: [name], MD;  Location: [place];  Service: Thoracic;;     Current Meds  Medication Sig  . ALPRAZolam (XANAX) 0.5 MG tablet Take 1 tablet (0.5 mg total) by mouth 3 (three) times daily as needed for anxiety.  [name] atorvastatin (LIPITOR) 20 MG tablet Take 20 mg by mouth at bedtime.  . clopidogrel (PLAVIX) 75 MG tablet TAKE ONE TABLET BY MOUTH DAILY  . docusate sodium (COLACE) 100 MG capsule Take 100 mg by mouth at bedtime.  [name] esomeprazole (NEXIUM) 40 MG capsule Take 40 mg by mouth daily at 12 noon.  [name] FLUoxetine (PROZAC) 10 MG capsule Take 10 mg by mouth every morning. Take with 40 mg capsule = 50 mg total  . FLUoxetine (PROZAC) 40 MG capsule Take 40 mg by mouth daily. Take with 10 mg capsule = 50 mg total  . gabapentin  (NEURONTIN) 300 MG capsule Take 600 mg by mouth 2 (two) times daily.  . Glycopyrrolate-Formoterol (BEVESPI AEROSPHERE) 9-4.8 MCG/ACT AERO Inhale 2 puffs into the lungs 2 (two) times daily.  [name] ibuprofen (ADVIL) 200 MG tablet Take 400 mg by mouth every 6 (six) hours as needed for fever, headache or mild pain.  [name] insulin aspart (NOVOLOG) 100 UNIT/ML injection Inject 22 Units into the skin 3 (three) times daily before meals.   . isosorbide mononitrate (IMDUR) 60 MG 24 hr tablet TAKE ONE TABLET BY MOUTH DAILY  . LANTUS SOLOSTAR 100 UNIT/ML Solostar Pen Inject 112 Units into the skin 2 (two) times daily.   [name] linagliptin (TRADJENTA) 5 MG TABS tablet Take 5 mg by mouth daily.  [name] LORazepam (ATIVAN) 1 MG tablet Take 1 mg by mouth at bedtime.   . nitroGLYCERIN (NITROSTAT) 0.4 MG SL tablet Place 1 tablet (0.4 mg total) under the tongue every 5 (five) minutes as needed for chest pain. PLACE ONE TABLET UNDER THE TONGUE EVERY FIVE MINUTES AS NEEDED FOR CHEST PAIN. IF  3 TABLETS ARE NECESSARY CALL 911  . oxybutynin (DITROPAN-XL) 10 MG 24 hr tablet Take 10 mg by mouth daily.  [name] oxyCODONE-acetaminophen (PERCOCET) 10-325 MG tablet Take 1 tablet by mouth every 6 (six) hours as needed for pain.  . pantoprazole (PROTONIX) 40 MG tablet Take 40 mg by mouth daily.  . potassium chloride SA (K-DUR,KLOR-CON) 20 MEQ tablet Take 20 mEq by mouth 3 (three) times daily.  . pregabalin (LYRICA) 25 MG capsule Take 25 mg by mouth 2 (two) times daily.  . Probiotic Product (PROBIOTIC PO) Take 1 capsule by mouth daily.  . tamsulosin (FLOMAX) 0.4 MG CAPS capsule Take 0.4 mg by mouth daily.   [name] torsemide (DEMADEX) 20 MG tablet TAKE THREE TABLETS BY MOUTH TWICE DAILY (Patient taking differently: Take 60 mg by mouth 2 (two) times daily. )  . zolpidem (AMBIEN) 5 MG tablet Take 5 mg by mouth at bedtime as needed. for insomnia  . [DISCONTINUED] diltiazem (CARDIZEM CD) 240 MG 24 hr capsule Take 240 mg by mouth daily.     Allergies:   Oxycodone    Social History   Tobacco Use  . Smoking status: Current Some Day Smoker    Packs/day: 1.00    Years: 41.00    Pack years: 41.00    Types: Cigarettes    Start date: 05/03/1972  . Smokeless tobacco: Never Used  . Tobacco comment: 2cigs per day as of 08/01/2018  Substance Use Topics  . Alcohol use: No    Alcohol/week: 0.0 standard drinks  . Drug use: No    Types: Cocaine    Comment: last used cocaine 11/2013. but is cocaine + this admit July 2015     Family Hx: The patient's family history includes CAD in his brother and father; Cancer in his maternal grandfather, maternal grandmother, and mother; Diabetes in an other family member; Hypertension in an other family member; Lung cancer in his father; Stroke in an other family member.  ROS:   Please see the history of present illness. All other systems reviewed and are negative.   Prior CV studies:   The following studies were reviewed today:  Cardiac catheterization 12/20/2013: Hemodynamic Findings: Central aortic pressure: 120/62 Left ventricular pressure: 127/7/15  Angiographic Findings:  Left main: Short segment. No obstructive disease.   Left Anterior Descending Artery: Moderate caliber vessel that courses to the apex. There is 40% proximal stenosis. The mid stented segment is patent without restenosis. The distal vessel has diffuse non-obstructive plaque. The diagonal branches are small in caliber with no obstructive disease.   Circumflex Artery: Large dominant system with large bifurcating first obtuse marginal branch and three small caliber posterolateral branches. The OM branch has diffuse 50% proximal stenosis, no focally obstructive lesions. The three small caliber posterolateral branches all have diffuse moderate to severe stenosis that is unchanged from last cath.   Right Coronary Artery: Small non-dominant vessel with 40% mid vessel stenosis.   Left Ventricular Angiogram: LVEF=65%  Impression: 1.  Double vessel CAD with patent stent mid LAD 2. Moderate non-obstructive disease in the Circumflex and RCA 3. Normal LV systolic function  Echocardiogram 07/05/2015: Study Conclusions  - Left ventricle: The cavity size was normal. Wall thickness was normal. Systolic function was normal. The estimated ejection fraction was in the range of 60% to 65%. Doppler parameters are consistent with abnormal left ventricular relaxation (grade 1 diastolic dysfunction).  Labs/Other Tests and Data Reviewed:    EKG:  An ECG dated 12/23/2018 was personally reviewed today and  demonstrated:  Sinus rhythm with leftward axis and old anterior infarct pattern.  Recent Labs: 06/01/2018: Magnesium 2.1 11/09/2018: ALT 11 12/23/2018: B Natriuretic Peptide 44.9; BUN 15; Creatinine, Ser 1.80; Hemoglobin 13.5; Platelets 234; Potassium 4.1; Sodium 134   Recent Lipid Panel Lab Results  Component Value Date/Time   CHOL 128 12/19/2013 02:20 AM   TRIG 152 (H) 12/19/2013 02:20 AM   HDL 31 (L) 12/19/2013 02:20 AM   CHOLHDL 4.1 12/19/2013 02:20 AM   LDLCALC 67 12/19/2013 02:20 AM    Wt Readings from Last 3 Encounters:  03/08/19 233 lb (105.7 kg)  02/20/19 239 lb (108.4 kg)  12/22/18 236 lb 3.2 oz (107.1 kg)     Objective:    Vital Signs:  BP (!) 77/59   Pulse 68   Ht 5\' 9"  (1.753 m)   Wt 233 lb (105.7 kg)   SpO2 90% Comment: on 6 L O2 via   BMI 34.41 kg/m    Patient spoke in full sentences, not obviously short of breath at rest. No audible wheezing or coughing. Speech pattern normal.  ASSESSMENT & PLAN:    1.  CAD with plan to continue medical therapy and stable angina symptoms overall in the setting of other chronic comorbidities.  He remains on Plavix and statin therapy with long-acting nitrate.  Given low blood pressures we will reduce Cardizem CD dose.  2.  End-stage interstitial lung disease with chronic hypoxic respiratory failure.  Continue to follow with Pulmonary.  3.  Chronic  diastolic heart failure.  He remains on Demadex with potassium supplements.  Weight is down in comparison to most recent office checks.  COVID-19 Education: The signs and symptoms of COVID-19 were discussed with the patient and how to seek care for testing (follow up with PCP or arrange E-visit).  The importance of social distancing was discussed today.  Time:   Today, I have spent 7 minutes with the patient with telehealth technology discussing the above problems.     Medication Adjustments/Labs and Tests Ordered: Current medicines are reviewed at length with the patient today.  Concerns regarding medicines are outlined above.   Tests Ordered: No orders of the defined types were placed in this encounter.   Medication Changes: Meds ordered this encounter  Medications  . diltiazem (CARDIZEM CD) 180 MG 24 hr capsule    Sig: Take 1 capsule (180 mg total) by mouth daily.    Dispense:  90 capsule    Refill:  2    03/08/2019 dose decrease    Follow Up:  Virtual Visit or In Person 6 months in the [place] office.  Signed, [name], MD  03/08/2019 9:45 AM    [place]

## 2019-03-08 ENCOUNTER — Telehealth (INDEPENDENT_AMBULATORY_CARE_PROVIDER_SITE_OTHER): Payer: Medicaid Other | Admitting: Cardiology

## 2019-03-08 ENCOUNTER — Encounter: Payer: Self-pay | Admitting: Cardiology

## 2019-03-08 VITALS — BP 77/59 | HR 68 | Ht 69.0 in | Wt 233.0 lb

## 2019-03-08 DIAGNOSIS — I5032 Chronic diastolic (congestive) heart failure: Secondary | ICD-10-CM | POA: Diagnosis not present

## 2019-03-08 DIAGNOSIS — J849 Interstitial pulmonary disease, unspecified: Secondary | ICD-10-CM | POA: Diagnosis not present

## 2019-03-08 DIAGNOSIS — J9611 Chronic respiratory failure with hypoxia: Secondary | ICD-10-CM | POA: Diagnosis not present

## 2019-03-08 DIAGNOSIS — I25119 Atherosclerotic heart disease of native coronary artery with unspecified angina pectoris: Secondary | ICD-10-CM

## 2019-03-08 MED ORDER — DILTIAZEM HCL ER COATED BEADS 180 MG PO CP24: 180.0000 mg | ORAL_CAPSULE | Freq: Every day | ORAL | 2 refills | Status: DC

## 2019-03-08 NOTE — Patient Instructions (Addendum)
Medication Instructions:   Your physician has recommended you make the following change in your medication:   Decrease diltiazem to 180 mg by mouth daily  Continue all other medications the same  Labwork:  NONE  Testing/Procedures:  NONE  Follow-Up:  Your physician recommends that you schedule a follow-up appointment in: 6 months for an office visit. You will receive a reminder letter in the mail in about 4 months reminding you to call and schedule your appointment. If you don't receive this letter, please contact our office.  Any Other Special Instructions Will Be Listed Below (If Applicable).  If you need a refill on your cardiac medications before your next appointment, please call your pharmacy.

## 2019-04-12 ENCOUNTER — Other Ambulatory Visit: Payer: Self-pay | Admitting: Cardiology

## 2019-04-19 ENCOUNTER — Telehealth: Payer: Self-pay | Admitting: Internal Medicine

## 2019-04-19 NOTE — Telephone Encounter (Signed)
LMTCB

## 2019-04-20 ENCOUNTER — Encounter: Payer: Self-pay | Admitting: Pulmonary Disease

## 2019-04-20 ENCOUNTER — Ambulatory Visit (INDEPENDENT_AMBULATORY_CARE_PROVIDER_SITE_OTHER): Payer: Medicaid Other | Admitting: Pulmonary Disease

## 2019-04-20 ENCOUNTER — Other Ambulatory Visit: Payer: Self-pay

## 2019-04-20 DIAGNOSIS — J849 Interstitial pulmonary disease, unspecified: Secondary | ICD-10-CM | POA: Diagnosis not present

## 2019-04-20 DIAGNOSIS — J441 Chronic obstructive pulmonary disease with (acute) exacerbation: Secondary | ICD-10-CM | POA: Diagnosis not present

## 2019-04-20 DIAGNOSIS — F17210 Nicotine dependence, cigarettes, uncomplicated: Secondary | ICD-10-CM

## 2019-04-20 DIAGNOSIS — Z720 Tobacco use: Secondary | ICD-10-CM

## 2019-04-20 MED ORDER — DOXYCYCLINE HYCLATE 100 MG PO TABS: 100.0000 mg | ORAL_TABLET | Freq: Two times a day (BID) | ORAL | 0 refills | Status: DC

## 2019-04-20 NOTE — Assessment & Plan Note (Signed)
Plan: We need you to stop smoking Please contact our office if you become interested in stopping smoking Continue to reduce your overall cigarette consumption daily Keep scheduled follow-up with Dr. Chase Caller in ILD clinic this month

## 2019-04-20 NOTE — Assessment & Plan Note (Addendum)
Plan: We recommend that you stop smoking We will refer you to the lung cancer screening program here they likely will coordinate getting you an appointment as well as a CT next year as you have a April/2020 CT already on file  Referral to clinical pharmacy team for smoking cessation

## 2019-04-20 NOTE — Assessment & Plan Note (Signed)
Plan: Doxycycline today You declined prednisone Continue to use your Bevespi Contact our office if symptoms are not improving Close follow-up with our office this month

## 2019-04-20 NOTE — Telephone Encounter (Signed)
Called spoke with patient. HE states he was never sent in the doxycycline when he was seen by [place] om 02/20/19. As it has been 2 months, I told patient he would need an appointment. Televisit 1130 per Wyn Quaker

## 2019-04-20 NOTE — Progress Notes (Signed)
Virtual Visit via Telephone Note  I connected with [name] on 04/20/19 at 11:30 AM EDT by telephone and verified that I am speaking with the correct person using two identifiers.  Location: Patient: Home Provider: Office [facility] - [id number], [place], [place], [place] [address]   I discussed the limitations, risks, security and privacy concerns of performing an evaluation and management service by telephone and the availability of in person appointments. I also discussed with the patient that there may be a patient responsible charge related to this service. The patient expressed understanding and agreed to proceed.  Patient consented to consult via telephone: Yes People present and their role in pt care: Pt   History of Present Illness:  63 year old male current smoker followed in our office for COPD, fibrosis on CT felt to be smoking-related ILD  Surgical lung biopsy 2015 read as DIP, reinterpreted February 2020 at [facility] felt to be smoking-related interstitial fibrosis with associated emphysema  Past medical history: Hypertension, type 2 diabetes, substance abuse, cocaine abuse, depression, anxiety, history of noncompliance, chronic kidney disease, polypharmacy Smoking history: Current every day smoker, 5 cigs/day, 41-pack-year smoking history Maintenance: Bevespi Patient of Dr. Chase Caller  Chief complaint: Fatigue, worsening symptoms   64 year old male current every day smoker followed in our office for likely RB ILD and COPD.  Patient was last seen in our office in August/2020 and was treated as a COPD exacerbation.  The patient reports that he never received the doxycycline that was supposed to be prescribed in August/2020.  He continues to have increased cough, fatigue, shortness of breath.  Patient does continue to smoke 5 cigarettes a day.  He is not interested in stopping smoking at this time.  He reports wheezing as well as a productive cough with  beige mucus.  He reports that he does not typically cough when he is at his baseline.  He has an upcoming scheduled appointment with Dr. Chase Caller in ILD clinic this month.  Smoking assessment and cessation counseling  Patient currently smoking: 5 cigarettes  I have advised the patient to quit/stop smoking as soon as possible due to high risk for multiple medical problems.  It will also be very difficult for [place] to manage patient's  respiratory symptoms and status if we continue to expose her lungs to a known irritant.  We do not advise e-cigarettes as a form of stopping smoking.  Patient is not willing to quit smoking.  I have advised the patient that we can assist and have options of nicotine replacement therapy, provided smoking cessation education today, provided smoking cessation counseling, and provided cessation resources.  Follow-up next office visit office visit for assessment of smoking cessation.  Smoking cessation counseling advised for: 4 min    Observations/Objective:  11/09/2018-CBC with differential-eosinophils relative, eosinophils absolute 0.1  12/23/2018-SARS-CoV-2-negative  02/20/2019-chest x-ray-no acute findings when compared to prior, pulmonary scarring asymmetric to the left  11/07/2018-CT chest without contrast- new small focal area of peripheral groundglass density in the anterior right upper lobe is likely infectious or inflammatory, unchanged pleural-parenchymal scarring and fibrosis in the mid left hemothorax, emphysema  09/08/2018-pulmonary function test- FVC 3.80 (81% predicted), ratio 76, FEV1 2.88 (82% predicted), DLCO 13.84 (51% predicted)  07/05/2015-echocardiogram-LV ejection fraction 60 to [id number], grade 1 diastolic dysfunction  Assessment and Plan:  ILD (interstitial lung disease) ([place]) Plan: We need you to stop smoking Please contact our office if you become interested in stopping smoking Continue to reduce your overall  cigarette consumption daily Keep  scheduled follow-up with Dr. Chase Caller in ILD clinic this month  COPD with acute exacerbation ([place]) Plan: Doxycycline today You declined prednisone Continue to use your Bevespi Contact our office if symptoms are not improving Close follow-up with our office this month  Tobacco abuse Plan: We recommend that you stop smoking We will refer you to the lung cancer screening program here they likely will coordinate getting you an appointment as well as a CT next year as you have a April/2020 CT already on file  Referral to clinical pharmacy team for smoking cessation   Follow Up Instructions:  Return in about 4 weeks (around 05/18/2019), or if symptoms worsen or fail to improve, for Follow up with Dr. Purnell Shoemaker.   I discussed the assessment and treatment plan with the patient. The patient was provided an opportunity to ask questions and all were answered. The patient agreed with the plan and demonstrated an understanding of the instructions.   The patient was advised to call back or seek an in-person evaluation if the symptoms worsen or if the condition fails to improve as anticipated.  I provided 24 minutes of non-face-to-face time during this encounter.   Lauraine Rinne, NP

## 2019-04-20 NOTE — Patient Instructions (Addendum)
You were seen today by Lauraine Rinne, NP  for:   1. COPD with acute exacerbation ([name])  - doxycycline (VIBRA-TABS) 100 MG tablet; Take 1 tablet (100 mg total) by mouth 2 (two) times daily.  Dispense: 14 tablet; Refill: 0  We recommend that you stop smoking  Bevespi Aerosphere inhaler >>>2 puffs daily twice a day (4 puffs total daily) >>>This is not a rescue inhaler >>>You take this daily no matter what    2. Tobacco abuse   We recommend that you stop smoking.  >>>You need to set a quit date >>>If you have friends or family who smoke, let them know you are trying to quit and not to smoke around you or in your living environment  Smoking Cessation Resources:  1 800 QUIT NOW  >>> Patient to call this resource and utilize it to help support her quit smoking >>> Keep up your hard work with stopping smoking  You can also contact the [facility] >>>For smoking cessation classes call [phone]  We do not recommend using e-cigarettes as a form of stopping smoking  You can sign up for smoking cessation support texts and information:  >>>https://smokefree.gov/smokefreetxt   Referral to clinical pharmacy team for smoking cessation help   - Ambulatory Referral for Lung Cancer Scre  3. ILD (interstitial lung disease) ([place])  Once again we emphasized the need to stop smoking  Complete close follow-up with Dr. Chase Caller in ILD clinic this month   We recommend today:  Orders Placed This Encounter  Procedures  . Ambulatory Referral for Lung Cancer Scre    Referral Priority:   Routine    Referral Type:   Consultation    Referral Reason:   Specialty Services Required    Number of Visits Requested:   1  . Amb Referral to Clinical Pharmacist    Referral Priority:   Routine    Referral Type:   Consultation    Number of Visits Requested:   1   Orders Placed This Encounter  Procedures  . Ambulatory Referral for Lung Cancer Scre  . Amb Referral to Clinical  Pharmacist   Meds ordered this encounter  Medications  . doxycycline (VIBRA-TABS) 100 MG tablet    Sig: Take 1 tablet (100 mg total) by mouth 2 (two) times daily.    Dispense:  14 tablet    Refill:  0    Follow Up:    Return in about 4 weeks (around 05/18/2019), or if symptoms worsen or fail to improve, for Follow up with Dr. Purnell Shoemaker.   Please do your part to reduce the spread of COVID-19:      Reduce your risk of any infection  and COVID19 by using the similar precautions used for avoiding the common cold or flu:  [name] Wash your hands often with soap and warm water for at least 20 seconds.  If soap and water are not readily available, use an alcohol-based hand sanitizer with at least 60% alcohol.  . If coughing or sneezing, cover your mouth and nose by coughing or sneezing into the elbow areas of your shirt or coat, into a tissue or into your sleeve (not your hands). Langley Gauss A MASK when in public  . Avoid shaking hands with others and consider head nods or verbal greetings only. . Avoid touching your eyes, nose, or mouth with unwashed hands.  . Avoid close contact with people who are sick. . Avoid places or events with large numbers of people  in one location, like concerts or sporting events. . If you have some symptoms but not all symptoms, continue to monitor at home and seek medical attention if your symptoms worsen. . If you are having a medical emergency, call 911.   [place] / e-Visit: [email or web address]         [facility]: [facility]: [id number]                   [facility]: [id number]     It is flu season:   >>> Best ways to protect herself from the flu: Receive the yearly flu vaccine, practice good hand hygiene washing with soap and also using hand sanitizer when available, eat a nutritious meals, get  adequate rest, hydrate appropriately   Please contact the office if your symptoms worsen or you have concerns that you are not improving.   Thank you for choosing [facility] for your healthcare, and for allowing [place] to partner with you on your healthcare journey. I am thankful to be able to provide care to you today.   Wyn Quaker FNP-C

## 2019-05-03 ENCOUNTER — Other Ambulatory Visit: Payer: Self-pay

## 2019-05-03 ENCOUNTER — Encounter: Payer: Self-pay | Admitting: Internal Medicine

## 2019-05-03 ENCOUNTER — Ambulatory Visit: Payer: Medicaid Other | Admitting: Internal Medicine

## 2019-05-03 VITALS — BP 146/74 | HR 86 | Temp 97.9°F | Ht 69.0 in | Wt 239.2 lb

## 2019-05-03 DIAGNOSIS — J438 Other emphysema: Secondary | ICD-10-CM | POA: Diagnosis not present

## 2019-05-03 DIAGNOSIS — J9611 Chronic respiratory failure with hypoxia: Secondary | ICD-10-CM | POA: Diagnosis not present

## 2019-05-03 DIAGNOSIS — J849 Interstitial pulmonary disease, unspecified: Secondary | ICD-10-CM

## 2019-05-03 DIAGNOSIS — Z23 Encounter for immunization: Secondary | ICD-10-CM | POA: Diagnosis not present

## 2019-05-03 NOTE — Patient Instructions (Signed)
ICD-10-CM   1. Chronic respiratory failure with hypoxia ([name])  J96.11   2. ILD (interstitial lung disease) ([place])  J84.9   3. Other emphysema ([place])  J43.8    Overall stable but lot of symptoms that are hard to cure  April 2020 CT chest did not show worsening  Plan - high dose flu shot 05/03/2019 - refer [facility] palliative care (not hospice) again - quit smoking - use o2 6 to 8L [place] as before -go to ER if worse  Followup  - 3 months or sooner if needed; ESAS score at followup

## 2019-05-03 NOTE — Progress Notes (Signed)
OV 04/10/2015  Chief Complaint  Patient presents with   Follow-up    Pt states his breathing has worsened since last OV. Pt was admitted to [facility] for CP and syncope. Pt c/o increase in DOE, prod cough with dark brown mucus, chest disfomfort when active.    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 At that point in time spirometry showed a significant decline in forced vital capacity from 4 L at baseline to 2.6 L. He was using 4 L of oxygen at rest and 6 L with exertion at home. However we could not convincingly document hypoxemia with exertion on room air in the office at that time. Therefore to this and I ordered pulmonary function tests and CT scan of the chest. He did have CT scan of the chest in August 2016. There is no report on progression compared to February 2016 but I suspect the findings are similar. While he underwent pulmonary function test he had cough syncope and was admitted to the hospital. Cardiology notes reviewed and the final diagnosis was cough syncope. He he now presents for follow-up. He continues to be miserable with class IV dyspnea. He uses 4-6 liters of oxygen because he subjectively feels the need to do that. He does not monitor his pulse ox at home. He says that he continues to have cough syncope at home and has fallen several times. He has now moved to live with his 12 year old mom. He is socially isolated otherwise. He feels like he needs an extra layer of help at home. There are no other issues  Pulse oximetry on room air at rest and walking desaturation test 185 feet 3 laps on room air: RA rest after 15 min off o2, pulse o98% and lowest pulse ox with forehead probe was 90%. He did get dizzy with coughing   OV 06/05/15 63 year old man with tobacco abuse and history of interstitial lung disease,  desquamative interstitial pneumonitis and burnt out Langerhans' cell histiocytosis, hypoxemic respiratory failure; untreated OSA unable to tolerate CPAP. He probably also has some degree of superimposed COPD.  He presents today with two weeks of increased dyspnea, . He is having paroxysms of cough, can lead to pre-syncope / syncope. Last was 2 days ago. He has LE edema, probable more than his baseline. He is using duoneb bid, symbicort bid, albuterol prn - averages once a day. He complains of dry mouth. He took extra lasix after last OV here with TP, helped his edema but not his dyspnea.     OV 07/05/2015  Chief Complaint  Patient presents with   Follow-up    Pt states his SOB has worsened since last OV. Pt also c/o increase in nonprod cough, pt states he frequently coughs when eating or drinking. Pt c/o right lateral chest pain when coughing. Pt denies f/c/s.   Admit    63yo male smoker with  ILD(DIP)  on Oxygen .  Polysubstance abuse hx.   08/09/2015 [facility] follow up  Patient returns for a post hospital follow-up. Patient was recently admitted with acute on chronic respiratory failure with underlying interstitial lung disease for  acute on chronic diastolic heart failure. He improved with diuresis .  He says he is feeling improved. Has decreased shortness of breath. Unfortunately continues to smoke. We discussed smoking cessation. He denies any hemoptysis, chest pain, orthopnea,  PND, or increased leg swelling.    OV 10/14/2015  Chief Complaint  Patient presents with   Follow-up    Still having SOB, coughing and mild wheezing. Pt states that he feels like fluid is coming back especially on left side.    Follow-up interstitial lung disease and chronic hypoxemic respiratory failure associated with volume overload and diastolic dysfunction and coronary artery disease and multiple medical problems including depression and anxiety and poor social situation  He presents for routine  follow-up. During stable health times of November been able to document hypoxemia and him on room air at rest. Also during admissions he has been hypoxemic. Again today on room air 10 minutes his pulse ox was 97%. He insists that he gets very dyspneic when he turns his oxygen off and he needs 5 L of oxygen at all times. He does not check his pulse ox at home so we do not know his true oxygen levels when he is off oxygen. He has class III-for dyspnea. He is on oxygen. He lives with his mom was 64 years old and takes care of him. Overall he feels stable. [place] symptom assessment score documented below and it is obvious that is significant problems are fatigue, depression, anxiety and shortness of breath. He is open to undergo home palliative care program  [facility] Symptom Assessment Numerical Scale 0 is no problem -> 10 worst problem 10/14/2015    No Pain -> Worst pain 0  No Tiredness -> Worset tiredness 10  No Nausea -> Worst nausea 0  No Depression -> Worst depression 7  No Anxiety -> Worst Anxiety 8  No Drowsiness -> Worst Drowsiness 10  Best appetite-> Worst Appetitle 3  Best Feeling of well being -> Worst feeling 5  No dyspnea-> Worst dyspnea 8  Other problem (none -> severe) 10 - chest discomfort, when active  Completed by  patient         Last CT chest December 2016 and when compared to August 2016 no change in DIC pattern of ILD  Chest x-ray 10/04/2015: Reported as chronic changes or jaw baseline. I do not have these image for visualization  Lab work 07/26/2015: Shows a creatinine of 1.68 mg percent with a GFR of 43. Hemoglobin 13 g percent.  Cardiology visit with Dr. Domenic Polite 08/28/2015: There is discussion about switching his Lasix and metolazone and Demadex. Volume overload is likely multifactorial with RV dysfunction a significant component  Subjective:     Patient ID: CORDELLE CHOUDHARY, male   DOB: Jul 09, 1956, 63 y.o.   MRN: [id number]  HPI   OV 04/23/2017  Chief  Complaint  Patient presents with   Follow-up    Pt states that his breathing is worse than before. C/o SOB all the time, coughing, and chest pain. 6L O2 24/7.   Follow-up ILD chronic hypoxemic respiratory failure. He did have a high-resolution CT scan of the chest in September 2018. The shows mostly pulmonary edema and fluid pattern. I personally not seen in the last year and a half. He says that he continues to live with his mom. [facility] is visiting him 3 times a week and giving supportive care. He is on a lot of opioids and anxiolytics. He easily is nodding off in the exam room. He doesn't seem interested in giving me a good history. He has no insight into his disease. [place] symptom assessment score shows significant worsening of fatigue and pain and depression and overall high  symptom burden.   OV 03/10/2018  Chief Complaint  Patient presents with   Follow-up    Pt states he is having a lot of pain with his lungs and it is very unbearable. Pt also states his SOB is a lot worse, coughs all the time, has a lot of back pain, is very unsteady on his feet, and also has occ. CP.    [name] , 63 y.o. , with dob 04/13/1956 and male ,Not Hispanic or Latino from [address] [place] [address] - presents to lung clinic for hronic hypoxemic respiratory failure that previously eyes seen was on the basis of ILD but CT scan last in 2018 showed that he did not have ILD. In fact he's had a follow-up CT scan of the chest May 2019 that does not show ILD. But he does have chronic hypoxemic respiratory failure. The basis of this is poorly understood but given his smoking history and 2016 pulmonary function test showing isolated reduction in diffusion capacity this would be all COPD/emphysema. He has missed many appointments and is very poor with history given his opioid dependence. Today he is a little bit more awake and he tells me that he continues to be on 5 L oxygen  nasal cannula. He says that he frequently dozes off and loses balance and falls.He also says for the last few months his lungs have been hurting but is no cough or congestion. He says he is on nebulizers but we do not have this in the med list. He does not know what nebulizers he is on. On exam today was significantly wheezing according to the medical assistant.   OV 08/01/2018  Chief Complaint  Patient presents with   Follow-up    Pt states his breathing has become worse since last visit, has a dry cough, has had chest pain, and states his lungs have hurt. Pt states he feels like his lungs are burning.      OV 08/01/2018  Subjective:  Patient ID: [name], male , DOB: Sep 12, 1955 , age 63 y.o. , MRN: [id number] , ADDRESS: [place] [id number]   08/01/2018 -   Chief Complaint  Patient presents with   Follow-up    Pt states his breathing has become worse since last visit, has a dry cough, has had chest pain, and states his lungs have hurt. Pt states he feels like his lungs are burning.     HPI ARKA VEDROS 63 y.o. -presents for his follow-up of mixed emphysema with DIP/burned-out Langerhans' cell histiocytosis.  Since his last visit he got admitted November 2019 with acute on chronic hypoxemic respiratory failure.  In the interim he continues to smoke.  He is no longer in hospice but is requesting hospice back because he benefited from the extra layer of support of the palliative piece.  He thinks he might be dying but there is no evidence of that.  His primary care physician ordered ultrasound of the liver that is reported as diffuse hepatocellular disease not otherwise specified.  He will follow-up with [place] primary care physician about it.  Today try to requalify for oxygen.  We turned his oxygen off and walked him but even after 1 lap he became fatigued and wobbly and was at fall risk so we could not walk him any further.  His main issue is that he has significant  multiple symptomatology that includes significant fatigue and nonspecific abdominal pain and shortness of breath.  COPD CAT score showed a symptom burden at 33 with fatigue at rated as very severe, sleep quality is very severe limitation in activities is very severe and dyspnea is very severe and chest tightness is very severe.  The only thing he does not have is any sputum production but he has a moderate cough.       OV 09/08/2018  Subjective:  Patient ID: [name], male , DOB: Mar 09, 1956 , age 44 y.o. , MRN: [id number] , ADDRESS: [place] [id number]   09/08/2018 -   Chief Complaint  Patient presents with   Follow-up    PFT performed today. Pt states he has been worse since last visit. States he had the flu 1 week ago. Pt states his breathing is worse and has more fatigue than usual.   Interstitial lung disease with emphysema -surgical lung biopsy May 18, 2014 read in [place] as DIP with burned-out Langerhans' cell histiocytosis but reinterpreted February 2020 at [facility] as [facility] = smoking-related interstitial fibrosis associated with emphysema  Polypharmacy pain medications and heavy symptom burden  HPI MARQUALE PAVLOCK 63 y.o. -returns for follow-up.  He was seen approximately 1 month ago.  At that point in time we decided to re-stage and re-figure his interstitial lung disease.  I discussed his 12 pathology slide at our interstitial lung disease conference.  Our local pulmonary pathologist feels strongly that there is no evidence of Langerhan histiocytosis X.  Instead he has smoking-related interstitial fibrosis (SRIF) with associated emphysema.  On his latest high-resolution CT chest there is not much of a description of ILD even though he has crackles on exam.  Report as below and I personally visualized that CT.  He has a steady progression and worsening of pulmonary function test over 3 or 4 years as documented below.  However, he is not  hypoxemic.  He is continuing to use oxygen and is requesting oxygen for symptomatic relief of dyspnea.  However when he turned the oxygen off and walked him he is not hypoxemic at all.  His room air at rest pulse ox is 97% with heart rate of 88/min.  We walked him 2 laps and he stopped and his pulse ox dropped to 92% and his heart rate rose to 94%.  While he does drop his pulse ox by more than 3 points he is not going below 88%.  On the other hand he is becoming very symptomatic with severe shortness of breath dizziness and staggering.  Medical assistant notes that he is always nodding off.  We administered a [place] symptom assessment score and he has significant poly-symptom burden.  Medication review shows polypharmacy.  He is unable to quit any of these.   CT chest high resolution 08/11/2018 Lungs/Pleura: Postoperative changes of wedge resection are again noted in the left lower lobe and tracking along the left major fissure, similar to the prior study. However, compared to the prior examination there is greatly increased pleuroparenchymal thickening and architectural distortion in the mid to lower left hemithorax with extensive parenchymal banding throughout this region. These findings are markedly asymmetric compared to the contralateral side where there is only minimal linear scarring in the posterior aspect of the right lower lobe in the most dependent portion of the lung. High-resolution images otherwise demonstrate no widespread areas of ground-glass attenuation, septal thickening, traction bronchiectasis or honeycombing elsewhere in the lungs. Inspiratory and expiratory imaging demonstrates mild air trapping, most evident in the left lower lobe.  No acute consolidative airspace disease. No pleural effusions. Diffuse bronchial wall thickening with mild centrilobular and paraseptal emphysema.  IMPRESSION: 1. Although there are areas of increasing fibrosis throughout the mid to lower  lung, these findings are clearly asymmetric with the contralateral side and are favored to be related to developing areas of pleuroparenchymal scarring, potentially related to prior surgery. The pattern is not a usual pattern for any typical interstitial lung disease, and there is no involvement of the right lung. 2. Diffuse bronchial wall thickening with mild centrilobular and paraseptal emphysema; imaging findings compatible with the reported clinical history of underlying COPD. 3. Air trapping, most evident in the left lower lobe, indicative of small airways disease. 4. Aortic atherosclerosis, in addition to left main and 3 vessel coronary artery disease. Please note that although the presence of coronary artery calcium documents the presence of coronary artery disease, the severity of this disease and any potential stenosis cannot be assessed on this non-gated CT examination. Assessment for potential risk factor modification, dietary therapy or pharmacologic therapy may be warranted, if clinically indicated. 5. Severe hepatic steatosis. 6. Lipomatous hypertrophy of the interatrial septum.  Aortic Atherosclerosis (ICD10-I70.0) and Emphysema (ICD10-J43.9).   Electronically Signed   By: Vinnie Langton M.D.   On: 08/12/2018 08:29    OV 05/03/2019  Subjective:  Patient ID: [name], male , DOB: [birth year] , age 63 y.o. , MRN: [id number] , ADDRESS: [place] [id number]   Interstitial lung disease with emphysema -surgical lung biopsy May 18, 2014 read in [place] as DIP with burned-out Langerhans' cell histiocytosis but reinterpreted February 2020 at [facility] as [facility] = smoking-related interstitial fibrosis associated with emphysema  05/03/2019 -   Chief Complaint  Patient presents with   COPD with acute exacerbation    Feels his COPD has gotten worse since the televisit on 04/20/19    Fu mixed ILD and emphysema with chronic  respiratory failure    HPI RENNIE GALINATO 63 y.o. -presents for follow-up.  Last seen by myself in early part of the year.  Since then has seen nurse practitioner 2 or 3 times through combination of physical visits and video/telephone visits.  1 of the times he is sent to the emergency department and admitted.  Last CT scan of the chest was in April 2020 and did not show any progression of his combination emphysema and ILD.  He had a blood gas at that time without any hypercapnia.  He tells me that he continues to live with his mom.  He uses between 6 and 8 L of oxygen and is stable.  He continues to have multiple symptoms including confusion early in the morning [he is noticed to be on a lot of pain meds] and chronic pain including pain in the right lateral chest.  In terms of his breathing he is stable.  Overall his quality of life and functional status is poor with an ECOG of 4.  Home palliative care used to visit with him but not anymore.  He wants the support again.  He is not interested in hospice.  He is asking for the shingles shot but we do not have this.  He will have his high-dose flu shot today.  Overall supportive care    Results for LUV, RASKA (MRN [id number]) as of 05/03/2019 10:53  Ref. Range 11/07/2018 17:17  pH, Ven Latest Ref Range: 7.250 - 7.430  7.373  pCO2, Ven Latest Ref Range: 44.0 -  60.0 mmHg 40.6 (L)  pO2, Ven Latest Ref Range: 32.0 - 45.0 mmHg 63.0 (H)    [place] Symptom Assessment Numerical Scale 0 is no problem -> 10 worst problem 10/14/2015   04/23/2017  08/01/2018   No Pain -> Worst pain 0 7 9  No Tiredness -> Worset tiredness 10 9 10   No Nausea -> Worst nausea 0 0 0  No Depression -> Worst depression 7 10 5   No Anxiety -> Worst Anxiety 8 5 5   No Drowsiness -> Worst Drowsiness 10 3 (though was nodding off) 1  Best appetite-> Worst Appetitle 3 4 3   Best Feeling of well being -> Worst feeling 5 7 1   Cough   8  No dyspnea-> Worst dyspnea 8 8 10   Other  problem (none -> severe) 10 - chest discomfort, when active 7 leg pain 10  Completed by  patient patient patient         Results for SURIEL, CALVERLEY (MRN [id number]) as of 09/08/2018 11:46  Ref. Range 04/13/2014 11:43 09/07/2014 11:39 09/08/2018 10:21  FVC-Pre Latest Units: L 4.37 4.03 3.80  FVC-%Pred-Pre Latest Units: % 90 83 81   Results for MACLOVIO, EXLINE (MRN [id number]) as of 09/08/2018 11:46  Ref. Range 04/13/2014 11:43 09/07/2014 11:39 09/08/2018 10:21  DLCO unc Latest Units: ml/min/mmHg 17.92 16.41 13.84  DLCO unc % pred Latest Units: % 55 50 51      IMPRESSION: Chest:  1. New small focal area of peripheral ground-glass density in the anterior right upper lobe is likely infectious or inflammatory. 2. Unchanged pleuroparenchymal scarring and fibrosis in the mid to lower left hemithorax. 3.  Emphysema (ICD10-J43.9). 4.  Aortic atherosclerosis (ICD10-I70.0).  Abdomen and pelvis:  1.  No acute intra-abdominal process. 2. Hepatic steatosis. 3. Prior right nephrectomy. 4. Bilateral femoral head avascular necrosis.   Electronically Signed   By: Titus Dubin M.D.   On: 11/07/2018 23:33   ROS - per HPI     has a past medical history of Anxiety, Arthritis, Cholecystitis, acute (12/20/2013), Cocaine abuse ([place]), COPD (chronic obstructive pulmonary disease) ([place]), Coronary atherosclerosis of native coronary artery, Depression, Essential hypertension, benign, GERD (gastroesophageal reflux disease), Headache, Hepatitis (Late 1970s), History of nephrolithiasis, History of pneumonia, History of stroke, Hyperlipidemia, Noncompliance, Renal cell carcinoma ([place]), Sleep apnea, and Type 2 diabetes mellitus ([place]) (2011).   reports that he has been smoking cigarettes. He started smoking about 47 years ago. He has a 41.00 pack-year smoking history. He has never used smokeless tobacco.  Past Surgical History:  Procedure Laterality Date   APPENDECTOMY  1970's   CHOLECYSTECTOMY N/A  12/22/2013   Procedure: LAPAROSCOPIC CHOLECYSTECTOMY ;  Surgeon: [name], MD;  Location: [place];  Service: General;  Laterality: N/A;   ENDARTERECTOMY Right 10/08/2014   Procedure: Right ENDARTERECTOMY CAROTID;  Surgeon: [name], MD;  Location: [place];  Service: Vascular;  Laterality: Right;   LAPAROSCOPIC LYSIS OF ADHESIONS  02/21/2014   Procedure: LAPAROSCOPIC LYSIS OF ADHESIONS EXTINSIVE;  Surgeon: [name], MD;  Location: [facility];  Service: Urology;;   LEFT HEART CATHETERIZATION WITH CORONARY ANGIOGRAM N/A 06/02/2012   Procedure: LEFT HEART CATHETERIZATION WITH CORONARY ANGIOGRAM;  Surgeon: [name], MD;  [facility] LAB;  Service: Cardiovascular;  Laterality: N/A;   LEFT HEART CATHETERIZATION WITH CORONARY ANGIOGRAM N/A 03/09/2013   Procedure: LEFT HEART CATHETERIZATION WITH CORONARY ANGIOGRAM;  Surgeon: [name], MD;  Location: [facility] CATH LAB;  Service: Cardiovascular;  Laterality: N/A;  LEFT HEART CATHETERIZATION WITH CORONARY ANGIOGRAM N/A 12/20/2013   Procedure: LEFT HEART CATHETERIZATION WITH CORONARY ANGIOGRAM;  Surgeon: [name], MD;  Location: [facility] CATH LAB;  Service: Cardiovascular;  Laterality: N/A;   LUNG BIOPSY Left 05/18/2014   Procedure: LUNG BIOPSY left upper lobe & left lower lobe;  Surgeon: [name], MD;  Location: [place];  Service: Thoracic;  Laterality: Left;   PATCH ANGIOPLASTY Right 10/08/2014   Procedure: PATCH ANGIOPLASTY Right Carotid;  Surgeon: [name], MD;  Location: [place];  Service: Vascular;  Laterality: Right;   ROBOT ASSISTED LAPAROSCOPIC NEPHRECTOMY Right 02/21/2014   Procedure: ROBOTIC ASSISTED LAPAROSCOPIC RIGHT NEPHRECTOMY ;  Surgeon: [name], MD;  Location: [facility];  Service: Urology;  Laterality: Right;   VIDEO ASSISTED THORACOSCOPY Left 05/18/2014   Procedure: LEFT VIDEO ASSISTED THORACOSCOPY;  Surgeon: [name], MD;  Location: [place];  Service: Thoracic;  Laterality: Left;    VIDEO BRONCHOSCOPY  05/18/2014   Procedure: VIDEO BRONCHOSCOPY;  Surgeon: [name], MD;  Location: [place];  Service: Thoracic;;    Allergies  Allergen Reactions   Oxycodone Itching and Nausea Only    Pt is able to tolerate if taken with benadryl    Immunization History  Administered Date(s) Administered   Influenza Split 04/04/2014, 03/27/2015   Pneumococcal Polysaccharide-23 11/04/2012    Family History  Problem Relation Age of Onset   Cancer Mother        Thyroid - living in her 57's.   Hypertension Other    Diabetes Other    Stroke Other    Lung cancer Father    CAD Father    Cancer Maternal Grandmother        Breast   Cancer Maternal Grandfather        Throat and stomach   CAD Brother      Current Outpatient Medications:    ALPRAZolam (XANAX) 0.5 MG tablet, Take 1 tablet (0.5 mg total) by mouth 3 (three) times daily as needed for anxiety., Disp: , Rfl: 0   atorvastatin (LIPITOR) 20 MG tablet, Take 20 mg by mouth at bedtime., Disp: , Rfl: 1   clopidogrel (PLAVIX) 75 MG tablet, TAKE ONE TABLET BY MOUTH DAILY, Disp: 90 tablet, Rfl: 3   diltiazem (CARDIZEM CD) 180 MG 24 hr capsule, Take 1 capsule (180 mg total) by mouth daily., Disp: 90 capsule, Rfl: 2   docusate sodium (COLACE) 100 MG capsule, Take 100 mg by mouth at bedtime., Disp: , Rfl:    doxycycline (VIBRA-TABS) 100 MG tablet, Take 1 tablet (100 mg total) by mouth 2 (two) times daily., Disp: 14 tablet, Rfl: 0   esomeprazole (NEXIUM) 40 MG capsule, Take 40 mg by mouth daily at 12 noon., Disp: , Rfl:    FLUoxetine (PROZAC) 10 MG capsule, Take 10 mg by mouth every morning. Take with 40 mg capsule = 50 mg total, Disp: , Rfl: 0   FLUoxetine (PROZAC) 40 MG capsule, Take 40 mg by mouth daily. Take with 10 mg capsule = 50 mg total, Disp: , Rfl:    gabapentin (NEURONTIN) 300 MG capsule, Take 600 mg by mouth 2 (two) times daily., Disp: , Rfl:    Glycopyrrolate-Formoterol (BEVESPI AEROSPHERE)  9-4.8 MCG/ACT AERO, Inhale 2 puffs into the lungs 2 (two) times daily., Disp: 1 Inhaler, Rfl: 11   ibuprofen (ADVIL) 200 MG tablet, Take 400 mg by mouth every 6 (six) hours as needed for fever, headache or mild pain., Disp: , Rfl:  insulin aspart (NOVOLOG) 100 UNIT/ML injection, Inject 22 Units into the skin 3 (three) times daily before meals. , Disp: , Rfl:    isosorbide mononitrate (IMDUR) 60 MG 24 hr tablet, TAKE ONE TABLET BY MOUTH DAILY, Disp: 90 tablet, Rfl: 1   LANTUS SOLOSTAR 100 UNIT/ML Solostar Pen, Inject 112 Units into the skin 2 (two) times daily. , Disp: , Rfl: 4   linagliptin (TRADJENTA) 5 MG TABS tablet, Take 5 mg by mouth daily., Disp: , Rfl:    LORazepam (ATIVAN) 1 MG tablet, Take 1 mg by mouth at bedtime. , Disp: , Rfl:    nicotine (NICODERM CQ - DOSED IN MG/24 HOURS) 14 mg/24hr patch, Place 1 patch (14 mg total) onto the skin daily., Disp: 28 patch, Rfl: 0   nitroGLYCERIN (NITROSTAT) 0.4 MG SL tablet, Place 1 tablet (0.4 mg total) under the tongue every 5 (five) minutes as needed for chest pain. PLACE ONE TABLET UNDER THE TONGUE EVERY FIVE MINUTES AS NEEDED FOR CHEST PAIN. IF 3 TABLETS ARE NECESSARY CALL 911, Disp: 25 tablet, Rfl: 3   oxybutynin (DITROPAN-XL) 10 MG 24 hr tablet, Take 10 mg by mouth daily., Disp: , Rfl: 11   oxyCODONE-acetaminophen (PERCOCET) 10-325 MG tablet, Take 1 tablet by mouth every 6 (six) hours as needed for pain., Disp: 30 tablet, Rfl: 0   pantoprazole (PROTONIX) 40 MG tablet, Take 40 mg by mouth daily., Disp: , Rfl:    potassium chloride SA (K-DUR,KLOR-CON) 20 MEQ tablet, Take 20 mEq by mouth 3 (three) times daily., Disp: , Rfl:    pregabalin (LYRICA) 25 MG capsule, Take 25 mg by mouth 2 (two) times daily., Disp: , Rfl:    Probiotic Product (PROBIOTIC PO), Take 1 capsule by mouth daily., Disp: , Rfl:    tamsulosin (FLOMAX) 0.4 MG CAPS capsule, Take 0.4 mg by mouth daily. , Disp: , Rfl:    torsemide (DEMADEX) 20 MG tablet, TAKE THREE  TABLETS BY MOUTH TWICE DAILY (Patient taking differently: Take 60 mg by mouth 2 (two) times daily. ), Disp: 620 tablet, Rfl: 3   zolpidem (AMBIEN) 5 MG tablet, Take 5 mg by mouth at bedtime as needed. for insomnia, Disp: , Rfl:       Objective:   Vitals:   05/03/19 1055  BP: (!) 146/74  Pulse: 86  Temp: 97.9 F (36.6 C)  SpO2: 98%  Weight: 239 lb 3.2 oz (108.5 kg)  Height: 5\' 9"  (1.753 m)    Estimated body mass index is 35.32 kg/m as calculated from the following:   Height as of this encounter: 5\' 9"  (1.753 m).   Weight as of this encounter: 239 lb 3.2 oz (108.5 kg).  @WEIGHTCHANGE @  [employer]   05/03/19 1055  Weight: 239 lb 3.2 oz (108.5 kg)     Physical Exam  General Appearance:    Alert, cooperative, no distress, appears stated age - older , Deconditioned looking - yes , OBESE  - yes, Sitting on Wheelchair -  yes  Head:    Normocephalic, without obvious abnormality, atraumatic  Eyes:    PERRL, conjunctiva/corneas clear,  Ears:    Normal TM's and external ear canals, both ears  Nose:   Nares normal, septum midline, mucosa normal, no drainage    or sinus tenderness. OXYGEN ON  - yes . Patient is @ 6L   Throat:   Lips, mucosa, and tongue normal; teeth and gums normal. Cyanosis on lips - no  Neck:   Supple, symmetrical, trachea midline, no  adenopathy;    thyroid:  no enlargement/tenderness/nodules; no carotid   bruit or JVD  Back:     Symmetric, no curvature, ROM normal, no CVA tenderness  Lungs:     Distress - no , Wheeze no, Barrell Chest - no, Purse lip breathing - no, Crackles - no   Chest Wall:    No tenderness or deformity.    Heart:    Regular rate and rhythm, S1 and S2 normal, no rub   or gallop, Murmur - n  Breast Exam:    NOT DONE  Abdomen:     Soft, non-tender, bowel sounds active all four quadrants,    no masses, no organomegaly. Visceral obesity - yes  Genitalia:   NOT DONE  Rectal:   NOT DONE  Extremities:   Extremities - normal, Has Cane - no,  Clubbing - no, Edema - no  Pulses:   2+ and symmetric all extremities  Skin:   Stigmata of Connective Tissue Disease - no  Lymph nodes:   Cervical, supraclavicular, and axillary nodes normal  Psychiatric:  Neurologic:   Pleasant - yes, Anxious - YES Flat affect - YEs  CAm-ICU - neg, Alert and Oriented x 3 - yes, Moves all 4s - yes, Speech - normal, Cognition - intact           Assessment:       ICD-10-CM   1. Chronic respiratory failure with hypoxia ([name])  J96.11   2. ILD (interstitial lung disease) ([place])  J84.9   3. Other emphysema ([place])  J43.8        Plan:     Patient Instructions     ICD-10-CM   1. Chronic respiratory failure with hypoxia ([name])  J96.11   2. ILD (interstitial lung disease) ([place])  J84.9   3. Other emphysema ([place])  J43.8    Overall stable but lot of symptoms that are hard to cure  April 2020 CT chest did not show worsening  Plan - high dose flu shot 05/03/2019 - refer [facility]ome palliative care (not hospice) again - quit smoking - use o2 6 to 8L [place] as before -go to ER if worse  Followup  - 3 months or sooner if needed; ESAS score at followup     SIGNATURE    Dr. Brand Males, M.D., F.C.C.P,  Pulmonary and Critical Care Medicine Staff Physician, [place] Director - Interstitial Lung Disease  Program  [facility] [place], [place], [address]  Pager: [phone], If no answer or between  15:00h - 7:00h: call [phone] Telephone: [phone]  11:11 AM 05/03/2019

## 2019-05-10 NOTE — Progress Notes (Deleted)
Patient ID: [name]                 DOB: Feb 18, 1956                      MRN: [id number]     Subjective:  [name] is a patient of Dr. Chase Caller referred by Wyn Quaker, FNP-C for smoking cessation on 04/20/19. PMH is significant for COPD, fibrosis on CT felt to be smoking-related ILD, HTN, T2DM, substance abuse, cocaine abuse, depression, anxiety, hx of noncompliance, CKD, and polypharmacy.  Pt presents today for initial appt for smoking cessation.  Insurance coverage: Medicaid Smoking cessation agents covered by insurance: bupropion, Chantix (quantity limited to 6 months per 12 months), NRT (patch/gum/lozenge)  Social history: alcohol use? current every day smoker (5 cigs/day, 41-pack-year smoking history)  Current smoking cessation agents: Nicoderm (14 mg) patch applied once daily Prior smoking cessation agents: Non-pharmacologic methods: Age when started using tobacco on a daily basis *** Triggers:  Motivation to quit:  Rates IMPORTANCE of quitting tobacco on 1-10 scale of *** Rates CONFIDENCE of quitting tobacco on 1-10 scale of ***  Fagerstrom Score Question Scoring Patient Score  How soon after waking do you smoke your first cigarette? <5 mins (3) 5-30 mins (2) 31-60 mins (1) >60 mins (0)   Do you find it difficult NOT to smoke in places where you shouldn't? Yes(1) No (0)   Which cigarette would you most hate to give up? First one in AM (1)  Any other one (0)      How many cigarettes do you smoke/day? 10 or less (0) 11-20 (1) 21-30 (2) >30 (3)   Do you smoke more during the first few hours after waking? Yes (1) No (0)   Do you smoke if you are so ill you cannot get out of bed? Yes (1) No (0)    Total Score   Score interpretation: low 1-2, low-to-moderate 3-4, moderate 5-7, high >7   Objective: Current Outpatient Medications on File Prior to Visit  Medication Sig Dispense Refill   ALPRAZolam (XANAX) 0.5 MG tablet Take 1 tablet (0.5 mg total) by mouth  3 (three) times daily as needed for anxiety.  0   atorvastatin (LIPITOR) 20 MG tablet Take 20 mg by mouth at bedtime.  1   clopidogrel (PLAVIX) 75 MG tablet TAKE ONE TABLET BY MOUTH DAILY 90 tablet 3   diltiazem (CARDIZEM CD) 180 MG 24 hr capsule Take 1 capsule (180 mg total) by mouth daily. 90 capsule 2   docusate sodium (COLACE) 100 MG capsule Take 100 mg by mouth at bedtime.     doxycycline (VIBRA-TABS) 100 MG tablet Take 1 tablet (100 mg total) by mouth 2 (two) times daily. 14 tablet 0   esomeprazole (NEXIUM) 40 MG capsule Take 40 mg by mouth daily at 12 noon.     FLUoxetine (PROZAC) 10 MG capsule Take 10 mg by mouth every morning. Take with 40 mg capsule = 50 mg total  0   FLUoxetine (PROZAC) 40 MG capsule Take 40 mg by mouth daily. Take with 10 mg capsule = 50 mg total     gabapentin (NEURONTIN) 300 MG capsule Take 600 mg by mouth 2 (two) times daily.     Glycopyrrolate-Formoterol (BEVESPI AEROSPHERE) 9-4.8 MCG/ACT AERO Inhale 2 puffs into the lungs 2 (two) times daily. 1 Inhaler 11   ibuprofen (ADVIL) 200 MG tablet Take 400 mg by mouth every 6 (six) hours as needed  for fever, headache or mild pain.     insulin aspart (NOVOLOG) 100 UNIT/ML injection Inject 22 Units into the skin 3 (three) times daily before meals.      isosorbide mononitrate (IMDUR) 60 MG 24 hr tablet TAKE ONE TABLET BY MOUTH DAILY 90 tablet 1   LANTUS SOLOSTAR 100 UNIT/ML Solostar Pen Inject 112 Units into the skin 2 (two) times daily.   4   linagliptin (TRADJENTA) 5 MG TABS tablet Take 5 mg by mouth daily.     LORazepam (ATIVAN) 1 MG tablet Take 1 mg by mouth at bedtime.      nicotine (NICODERM CQ - DOSED IN MG/24 HOURS) 14 mg/24hr patch Place 1 patch (14 mg total) onto the skin daily. 28 patch 0   nitroGLYCERIN (NITROSTAT) 0.4 MG SL tablet Place 1 tablet (0.4 mg total) under the tongue every 5 (five) minutes as needed for chest pain. PLACE ONE TABLET UNDER THE TONGUE EVERY FIVE MINUTES AS NEEDED FOR CHEST  PAIN. IF 3 TABLETS ARE NECESSARY CALL 911 25 tablet 3   oxybutynin (DITROPAN-XL) 10 MG 24 hr tablet Take 10 mg by mouth daily.  11   oxyCODONE-acetaminophen (PERCOCET) 10-325 MG tablet Take 1 tablet by mouth every 6 (six) hours as needed for pain. 30 tablet 0   pantoprazole (PROTONIX) 40 MG tablet Take 40 mg by mouth daily.     potassium chloride SA (K-DUR,KLOR-CON) 20 MEQ tablet Take 20 mEq by mouth 3 (three) times daily.     pregabalin (LYRICA) 25 MG capsule Take 25 mg by mouth 2 (two) times daily.     Probiotic Product (PROBIOTIC PO) Take 1 capsule by mouth daily.     tamsulosin (FLOMAX) 0.4 MG CAPS capsule Take 0.4 mg by mouth daily.      torsemide (DEMADEX) 20 MG tablet TAKE THREE TABLETS BY MOUTH TWICE DAILY (Patient taking differently: Take 60 mg by mouth 2 (two) times daily. ) 620 tablet 3   zolpidem (AMBIEN) 5 MG tablet Take 5 mg by mouth at bedtime as needed. for insomnia     No current facility-administered medications on file prior to visit.      Assessment/Plan: 1. Smoking Cessation: -Initiated nicotine replacement tx with ***. Patient counseled on purpose, proper use, and potential adverse effects, including ***    -Initiated bupropion ***. Patient with no PMH of seizures. Patient counseled on purpose, proper use, and potential adverse effects, including insomnia, and potential change in mood.   -Initiated varenicline titration of 0.5 mg by mouth once daily with food x3 days, then 0.5 mg by mouth twice daily with food x4 days, then 1 mg by mouth twice daily with food thereafter ***. Patient counseled on purpose, proper use, and potential adverse effects, including GI upset, and potential change in mood.   -Provided information on 1 800-QUIT NOW support program.   Thank you for involving pharmacy to assist in providing [name]'s care.   Drexel Iha, PharmD PGY2 Ambulatory Care Pharmacy Resident

## 2019-05-10 NOTE — Patient Instructions (Signed)
It was a pleasure seeing you in clinic today Mr. Dobin!  Today the plan is...  Please call the PharmD clinic at [phone] if you have any questions that you would like to speak with a pharmacist about Stanton Kidney, Museum/gallery conservator).

## 2019-05-11 ENCOUNTER — Telehealth: Payer: Self-pay | Admitting: Acute Care

## 2019-05-11 NOTE — Telephone Encounter (Signed)
LMTCB.   Will try back in the AM

## 2019-05-12 NOTE — Telephone Encounter (Signed)
Message routed to [place], [place] to follow up.

## 2019-05-15 NOTE — Telephone Encounter (Signed)
Spoke with pt and advised that Kevin Quaker, NP wants him to start in lung cancer screening in 10/2019.  Advised pt that we will try to schedule him through our grant since Medicaid does not cover this.  Pt verbalized understanding. Nothing further needed.

## 2019-05-15 NOTE — Telephone Encounter (Signed)
LMTC x 1  

## 2019-05-18 ENCOUNTER — Telehealth: Payer: Self-pay | Admitting: Internal Medicine

## 2019-05-18 NOTE — Telephone Encounter (Signed)
Called patient. HE got on phone and said he just fell. I asked if he hit is head patient states yes on the floor when he fell. I told patient to call EMS and let them come check him out.  He said that's what everyone has been telling him so he will do it.   Will keep in triage to follow up on 05/19/19

## 2019-05-19 ENCOUNTER — Emergency Department (HOSPITAL_COMMUNITY): Payer: Medicaid Other

## 2019-05-19 ENCOUNTER — Encounter (HOSPITAL_COMMUNITY): Payer: Self-pay | Admitting: Emergency Medicine

## 2019-05-19 ENCOUNTER — Other Ambulatory Visit: Payer: Self-pay

## 2019-05-19 ENCOUNTER — Inpatient Hospital Stay (HOSPITAL_COMMUNITY)
Admission: EM | Admit: 2019-05-19 | Discharge: 2019-05-21 | DRG: 871 | Discharge status: Against Medical Advice | Payer: Medicaid Other | Attending: Family Medicine | Admitting: Family Medicine

## 2019-05-19 DIAGNOSIS — G4733 Obstructive sleep apnea (adult) (pediatric): Secondary | ICD-10-CM | POA: Diagnosis present

## 2019-05-19 DIAGNOSIS — Z8249 Family history of ischemic heart disease and other diseases of the circulatory system: Secondary | ICD-10-CM

## 2019-05-19 DIAGNOSIS — Z801 Family history of malignant neoplasm of trachea, bronchus and lung: Secondary | ICD-10-CM

## 2019-05-19 DIAGNOSIS — I779 Disorder of arteries and arterioles, unspecified: Secondary | ICD-10-CM | POA: Diagnosis not present

## 2019-05-19 DIAGNOSIS — R6521 Severe sepsis with septic shock: Secondary | ICD-10-CM | POA: Diagnosis present

## 2019-05-19 DIAGNOSIS — E1165 Type 2 diabetes mellitus with hyperglycemia: Secondary | ICD-10-CM | POA: Diagnosis present

## 2019-05-19 DIAGNOSIS — F411 Generalized anxiety disorder: Secondary | ICD-10-CM | POA: Diagnosis present

## 2019-05-19 DIAGNOSIS — M1990 Unspecified osteoarthritis, unspecified site: Secondary | ICD-10-CM | POA: Diagnosis present

## 2019-05-19 DIAGNOSIS — Y95 Nosocomial condition: Secondary | ICD-10-CM | POA: Diagnosis present

## 2019-05-19 DIAGNOSIS — G9340 Encephalopathy, unspecified: Secondary | ICD-10-CM | POA: Diagnosis present

## 2019-05-19 DIAGNOSIS — A419 Sepsis, unspecified organism: Secondary | ICD-10-CM | POA: Diagnosis present

## 2019-05-19 DIAGNOSIS — Z7902 Long term (current) use of antithrombotics/antiplatelets: Secondary | ICD-10-CM

## 2019-05-19 DIAGNOSIS — E785 Hyperlipidemia, unspecified: Secondary | ICD-10-CM | POA: Diagnosis present

## 2019-05-19 DIAGNOSIS — J189 Pneumonia, unspecified organism: Secondary | ICD-10-CM | POA: Diagnosis present

## 2019-05-19 DIAGNOSIS — N400 Enlarged prostate without lower urinary tract symptoms: Secondary | ICD-10-CM | POA: Diagnosis present

## 2019-05-19 DIAGNOSIS — J8410 Pulmonary fibrosis, unspecified: Secondary | ICD-10-CM | POA: Diagnosis present

## 2019-05-19 DIAGNOSIS — I10 Essential (primary) hypertension: Secondary | ICD-10-CM | POA: Diagnosis present

## 2019-05-19 DIAGNOSIS — Z794 Long term (current) use of insulin: Secondary | ICD-10-CM | POA: Diagnosis not present

## 2019-05-19 DIAGNOSIS — Z9981 Dependence on supplemental oxygen: Secondary | ICD-10-CM | POA: Diagnosis not present

## 2019-05-19 DIAGNOSIS — J849 Interstitial pulmonary disease, unspecified: Secondary | ICD-10-CM | POA: Diagnosis present

## 2019-05-19 DIAGNOSIS — E1122 Type 2 diabetes mellitus with diabetic chronic kidney disease: Secondary | ICD-10-CM | POA: Diagnosis present

## 2019-05-19 DIAGNOSIS — I130 Hypertensive heart and chronic kidney disease with heart failure and stage 1 through stage 4 chronic kidney disease, or unspecified chronic kidney disease: Secondary | ICD-10-CM | POA: Diagnosis present

## 2019-05-19 DIAGNOSIS — I2510 Atherosclerotic heart disease of native coronary artery without angina pectoris: Secondary | ICD-10-CM | POA: Diagnosis present

## 2019-05-19 DIAGNOSIS — Z85118 Personal history of other malignant neoplasm of bronchus and lung: Secondary | ICD-10-CM

## 2019-05-19 DIAGNOSIS — F17210 Nicotine dependence, cigarettes, uncomplicated: Secondary | ICD-10-CM | POA: Diagnosis present

## 2019-05-19 DIAGNOSIS — I69354 Hemiplegia and hemiparesis following cerebral infarction affecting left non-dominant side: Secondary | ICD-10-CM | POA: Diagnosis not present

## 2019-05-19 DIAGNOSIS — Z905 Acquired absence of kidney: Secondary | ICD-10-CM

## 2019-05-19 DIAGNOSIS — J440 Chronic obstructive pulmonary disease with acute lower respiratory infection: Secondary | ICD-10-CM | POA: Diagnosis present

## 2019-05-19 DIAGNOSIS — Z66 Do not resuscitate: Secondary | ICD-10-CM | POA: Diagnosis present

## 2019-05-19 DIAGNOSIS — I5032 Chronic diastolic (congestive) heart failure: Secondary | ICD-10-CM | POA: Diagnosis present

## 2019-05-19 DIAGNOSIS — N183 Chronic kidney disease, stage 3 unspecified: Secondary | ICD-10-CM | POA: Diagnosis present

## 2019-05-19 DIAGNOSIS — E0865 Diabetes mellitus due to underlying condition with hyperglycemia: Secondary | ICD-10-CM

## 2019-05-19 DIAGNOSIS — Z85528 Personal history of other malignant neoplasm of kidney: Secondary | ICD-10-CM

## 2019-05-19 DIAGNOSIS — Z885 Allergy status to narcotic agent status: Secondary | ICD-10-CM

## 2019-05-19 DIAGNOSIS — F329 Major depressive disorder, single episode, unspecified: Secondary | ICD-10-CM | POA: Diagnosis present

## 2019-05-19 DIAGNOSIS — Z20828 Contact with and (suspected) exposure to other viral communicable diseases: Secondary | ICD-10-CM | POA: Diagnosis present

## 2019-05-19 DIAGNOSIS — F32A Depression, unspecified: Secondary | ICD-10-CM | POA: Diagnosis present

## 2019-05-19 DIAGNOSIS — J449 Chronic obstructive pulmonary disease, unspecified: Secondary | ICD-10-CM | POA: Diagnosis present

## 2019-05-19 DIAGNOSIS — J439 Emphysema, unspecified: Secondary | ICD-10-CM | POA: Diagnosis present

## 2019-05-19 DIAGNOSIS — J9621 Acute and chronic respiratory failure with hypoxia: Secondary | ICD-10-CM | POA: Diagnosis present

## 2019-05-19 DIAGNOSIS — R531 Weakness: Secondary | ICD-10-CM | POA: Diagnosis present

## 2019-05-19 DIAGNOSIS — K219 Gastro-esophageal reflux disease without esophagitis: Secondary | ICD-10-CM | POA: Diagnosis present

## 2019-05-19 DIAGNOSIS — G894 Chronic pain syndrome: Secondary | ICD-10-CM | POA: Diagnosis present

## 2019-05-19 HISTORY — DX: Sepsis, unspecified organism: A41.9

## 2019-05-19 LAB — COMPREHENSIVE METABOLIC PANEL
ALT: 14 U/L (ref 0–44)
AST: 18 U/L (ref 15–41)
Albumin: 2.9 g/dL — ABNORMAL LOW (ref 3.5–5.0)
Alkaline Phosphatase: 77 U/L (ref 38–126)
Anion gap: 10 (ref 5–15)
BUN: 19 mg/dL (ref 8–23)
CO2: 25 mmol/L (ref 22–32)
Calcium: 7.9 mg/dL — ABNORMAL LOW (ref 8.9–10.3)
Chloride: 100 mmol/L (ref 98–111)
Creatinine, Ser: 1.65 mg/dL — ABNORMAL HIGH (ref 0.61–1.24)
GFR calc Af Amer: 50 mL/min — ABNORMAL LOW (ref >60)
GFR calc non Af Amer: 44 mL/min — ABNORMAL LOW (ref >60)
Glucose, Bld: 286 mg/dL — ABNORMAL HIGH (ref 70–99)
Potassium: 3.6 mmol/L (ref 3.5–5.1)
Sodium: 135 mmol/L (ref 135–145)
Total Bilirubin: 0.6 mg/dL (ref 0.3–1.2)
Total Protein: 6.7 g/dL (ref 6.5–8.1)

## 2019-05-19 LAB — URINALYSIS, ROUTINE W REFLEX MICROSCOPIC
Bilirubin Urine: NEGATIVE
Glucose, UA: 50 mg/dL — AB
Hgb urine dipstick: NEGATIVE
Ketones, ur: NEGATIVE mg/dL
Leukocytes,Ua: NEGATIVE
Nitrite: NEGATIVE
Protein, ur: NEGATIVE mg/dL
Specific Gravity, Urine: 1.005 (ref 1.005–1.030)
pH: 5 (ref 5.0–8.0)

## 2019-05-19 LAB — BLOOD GAS, ARTERIAL
Acid-Base Excess: 2.6 mmol/L — ABNORMAL HIGH (ref 0.0–2.0)
Bicarbonate: 26 mmol/L (ref 20.0–28.0)
FIO2: 48
O2 Saturation: 94 %
Patient temperature: 36.6
pCO2 arterial: 52.1 mmHg — ABNORMAL HIGH (ref 32.0–48.0)
pH, Arterial: 7.346 — ABNORMAL LOW (ref 7.350–7.450)
pO2, Arterial: 73.5 mmHg — ABNORMAL LOW (ref 83.0–108.0)

## 2019-05-19 LAB — CBC WITH DIFFERENTIAL/PLATELET
Abs Immature Granulocytes: 0.09 10*3/uL — ABNORMAL HIGH (ref 0.00–0.07)
Basophils Absolute: 0 10*3/uL (ref 0.0–0.1)
Basophils Relative: 0 %
Eosinophils Absolute: 0.1 10*3/uL (ref 0.0–0.5)
Eosinophils Relative: 1 %
HCT: 37.6 % — ABNORMAL LOW (ref 39.0–52.0)
Hemoglobin: 12.1 g/dL — ABNORMAL LOW (ref 13.0–17.0)
Immature Granulocytes: 1 %
Lymphocytes Relative: 15 %
Lymphs Abs: 1.2 10*3/uL (ref 0.7–4.0)
MCH: 27.6 pg (ref 26.0–34.0)
MCHC: 32.2 g/dL (ref 30.0–36.0)
MCV: 85.8 fL (ref 80.0–100.0)
Monocytes Absolute: 0.5 10*3/uL (ref 0.1–1.0)
Monocytes Relative: 6 %
Neutro Abs: 6.3 10*3/uL (ref 1.7–7.7)
Neutrophils Relative %: 77 %
Platelets: 195 10*3/uL (ref 150–400)
RBC: 4.38 MIL/uL (ref 4.22–5.81)
RDW: 13.6 % (ref 11.5–15.5)
WBC: 8.3 10*3/uL (ref 4.0–10.5)
nRBC: 0 % (ref 0.0–0.2)

## 2019-05-19 LAB — PROTIME-INR
INR: 1 (ref 0.8–1.2)
Prothrombin Time: 13.1 seconds (ref 11.4–15.2)

## 2019-05-19 LAB — TROPONIN I (HIGH SENSITIVITY)
Troponin I (High Sensitivity): 5 ng/L (ref <18)
Troponin I (High Sensitivity): 6 ng/L (ref <18)

## 2019-05-19 LAB — GLUCOSE, CAPILLARY: Glucose-Capillary: 238 mg/dL — ABNORMAL HIGH (ref 70–99)

## 2019-05-19 LAB — APTT: aPTT: 30 seconds (ref 24–36)

## 2019-05-19 LAB — LACTIC ACID, PLASMA
Lactic Acid, Venous: 2.2 mmol/L (ref 0.5–1.9)
Lactic Acid, Venous: 1.4 mmol/L (ref 0.5–1.9)

## 2019-05-19 LAB — SARS CORONAVIRUS 2 (TAT 6-24 HRS): SARS Coronavirus 2: NEGATIVE

## 2019-05-19 LAB — AMMONIA: Ammonia: 25 umol/L (ref 9–35)

## 2019-05-19 MED ORDER — VANCOMYCIN HCL 1.25 G IV SOLR
1250.0000 mg | INTRAVENOUS | Status: DC
  Administered 2019-05-20: 21:00:00 1250 mg via INTRAVENOUS
  Filled 2019-05-19 (×2): qty 1250

## 2019-05-19 MED ORDER — OXYBUTYNIN CHLORIDE ER 5 MG PO TB24
10.0000 mg | ORAL_TABLET | Freq: Every day | ORAL | Status: DC
  Administered 2019-05-20 – 2019-05-21 (×2): 10 mg via ORAL
  Filled 2019-05-19 (×2): qty 2

## 2019-05-19 MED ORDER — SODIUM CHLORIDE 0.9 % IV SOLN
2.0000 g | Freq: Two times a day (BID) | INTRAVENOUS | Status: DC
  Administered 2019-05-20 – 2019-05-21 (×4): 2 g via INTRAVENOUS
  Filled 2019-05-19 (×4): qty 2

## 2019-05-19 MED ORDER — SODIUM CHLORIDE 0.9 % IV BOLUS (SEPSIS)
1000.0000 mL | Freq: Once | INTRAVENOUS | Status: AC
  Administered 2019-05-19: 1000 mL via INTRAVENOUS

## 2019-05-19 MED ORDER — VANCOMYCIN HCL IN DEXTROSE 1-5 GM/200ML-% IV SOLN
1000.0000 mg | Freq: Once | INTRAVENOUS | Status: AC
  Administered 2019-05-19: 1000 mg via INTRAVENOUS
  Filled 2019-05-19: qty 200

## 2019-05-19 MED ORDER — TAMSULOSIN HCL 0.4 MG PO CAPS
0.4000 mg | ORAL_CAPSULE | Freq: Every day | ORAL | Status: DC
  Administered 2019-05-20 – 2019-05-21 (×2): 0.4 mg via ORAL
  Filled 2019-05-19 (×2): qty 1

## 2019-05-19 MED ORDER — VANCOMYCIN HCL IN DEXTROSE 1-5 GM/200ML-% IV SOLN
1000.0000 mg | Freq: Once | INTRAVENOUS | Status: AC
  Administered 2019-05-19: 1000 mg via INTRAVENOUS

## 2019-05-19 MED ORDER — INSULIN ASPART 100 UNIT/ML ~~LOC~~ SOLN
0.0000 [IU] | Freq: Every day | SUBCUTANEOUS | Status: DC
  Administered 2019-05-19 – 2019-05-20 (×2): 2 [IU] via SUBCUTANEOUS

## 2019-05-19 MED ORDER — PANTOPRAZOLE SODIUM 40 MG PO TBEC: 40.0000 mg | DELAYED_RELEASE_TABLET | Freq: Every day | ORAL | Status: DC

## 2019-05-19 MED ORDER — OXYCODONE-ACETAMINOPHEN 5-325 MG PO TABS
1.0000 | ORAL_TABLET | Freq: Four times a day (QID) | ORAL | Status: DC | PRN
  Administered 2019-05-19 – 2019-05-20 (×2): 1 via ORAL
  Filled 2019-05-19 (×2): qty 1

## 2019-05-19 MED ORDER — LORAZEPAM 1 MG PO TABS
1.0000 mg | ORAL_TABLET | Freq: Every day | ORAL | Status: DC
  Administered 2019-05-19: 22:00:00 1 mg via ORAL
  Filled 2019-05-19: qty 1

## 2019-05-19 MED ORDER — ATORVASTATIN CALCIUM 20 MG PO TABS
20.0000 mg | ORAL_TABLET | Freq: Every day | ORAL | Status: DC
  Administered 2019-05-19 – 2019-05-20 (×2): 20 mg via ORAL
  Filled 2019-05-19 (×2): qty 1

## 2019-05-19 MED ORDER — SODIUM CHLORIDE 0.9 % IV SOLN
2.0000 g | Freq: Once | INTRAVENOUS | Status: AC
  Administered 2019-05-19: 2 g via INTRAVENOUS
  Filled 2019-05-19: qty 2

## 2019-05-19 MED ORDER — SODIUM CHLORIDE 0.9 % IV BOLUS (SEPSIS)
200.0000 mL | Freq: Once | INTRAVENOUS | Status: AC
  Administered 2019-05-19: 200 mL via INTRAVENOUS

## 2019-05-19 MED ORDER — SODIUM CHLORIDE 0.9 % IV SOLN
1000.0000 mL | INTRAVENOUS | Status: DC
  Administered 2019-05-19: 1000 mL via INTRAVENOUS

## 2019-05-19 MED ORDER — VANCOMYCIN HCL 1.25 G IV SOLR
1250.0000 mg | INTRAVENOUS | Status: DC
  Filled 2019-05-19 (×2): qty 1250

## 2019-05-19 MED ORDER — INSULIN GLARGINE 100 UNIT/ML ~~LOC~~ SOLN
70.0000 [IU] | Freq: Two times a day (BID) | SUBCUTANEOUS | Status: DC
  Administered 2019-05-19: 70 [IU] via SUBCUTANEOUS
  Filled 2019-05-19 (×6): qty 0.7

## 2019-05-19 MED ORDER — OXYCODONE HCL 5 MG PO TABS: 5.0000 mg | ORAL_TABLET | Freq: Four times a day (QID) | ORAL | Status: DC | PRN

## 2019-05-19 MED ORDER — NICOTINE 14 MG/24HR TD PT24
14.0000 mg | MEDICATED_PATCH | Freq: Every day | TRANSDERMAL | Status: DC
  Administered 2019-05-20 – 2019-05-21 (×2): 14 mg via TRANSDERMAL
  Filled 2019-05-19 (×2): qty 1

## 2019-05-19 MED ORDER — OXYCODONE-ACETAMINOPHEN 10-325 MG PO TABS: 1.0000 | ORAL_TABLET | Freq: Four times a day (QID) | ORAL | Status: DC | PRN

## 2019-05-19 MED ORDER — FLUOXETINE HCL 10 MG PO CAPS
10.0000 mg | ORAL_CAPSULE | Freq: Every morning | ORAL | Status: DC
  Filled 2019-05-19: qty 1

## 2019-05-19 MED ORDER — SODIUM CHLORIDE 0.9 % IV BOLUS (SEPSIS): 1000.0000 mL | Freq: Once | INTRAVENOUS | Status: DC

## 2019-05-19 MED ORDER — PNEUMOCOCCAL VAC POLYVALENT 25 MCG/0.5ML IJ INJ: 0.5000 mL | INJECTION | INTRAMUSCULAR | Status: DC | PRN

## 2019-05-19 MED ORDER — CLOPIDOGREL BISULFATE 75 MG PO TABS
75.0000 mg | ORAL_TABLET | Freq: Every day | ORAL | Status: DC
  Administered 2019-05-20 – 2019-05-21 (×2): 75 mg via ORAL
  Filled 2019-05-19 (×2): qty 1

## 2019-05-19 MED ORDER — VANCOMYCIN HCL IN DEXTROSE 1-5 GM/200ML-% IV SOLN
1000.0000 mg | Freq: Once | INTRAVENOUS | Status: DC
  Filled 2019-05-19: qty 200

## 2019-05-19 MED ORDER — INSULIN GLARGINE 100 UNIT/ML SOLOSTAR PEN: 30.0000 [IU] | PEN_INJECTOR | Freq: Two times a day (BID) | SUBCUTANEOUS | Status: DC

## 2019-05-19 MED ORDER — DOCUSATE SODIUM 100 MG PO CAPS
100.0000 mg | ORAL_CAPSULE | Freq: Every day | ORAL | Status: DC
  Administered 2019-05-19 – 2019-05-20 (×2): 100 mg via ORAL
  Filled 2019-05-19 (×2): qty 1

## 2019-05-19 MED ORDER — INSULIN ASPART 100 UNIT/ML ~~LOC~~ SOLN
0.0000 [IU] | Freq: Three times a day (TID) | SUBCUTANEOUS | Status: DC
  Administered 2019-05-20 – 2019-05-21 (×4): 5 [IU] via SUBCUTANEOUS
  Administered 2019-05-21: 09:00:00 2 [IU] via SUBCUTANEOUS

## 2019-05-19 MED ORDER — PANTOPRAZOLE SODIUM 40 MG PO TBEC
40.0000 mg | DELAYED_RELEASE_TABLET | Freq: Every day | ORAL | Status: DC
  Administered 2019-05-20 – 2019-05-21 (×2): 40 mg via ORAL
  Filled 2019-05-19 (×2): qty 1

## 2019-05-19 MED ORDER — HEPARIN SODIUM (PORCINE) 5000 UNIT/ML IJ SOLN
5000.0000 [IU] | Freq: Three times a day (TID) | INTRAMUSCULAR | Status: DC
  Administered 2019-05-19 – 2019-05-21 (×5): 5000 [IU] via SUBCUTANEOUS
  Filled 2019-05-19 (×5): qty 1

## 2019-05-19 MED ORDER — FLUOXETINE HCL 20 MG PO CAPS
40.0000 mg | ORAL_CAPSULE | Freq: Every day | ORAL | Status: DC
  Filled 2019-05-19: qty 2

## 2019-05-19 NOTE — ED Notes (Signed)
To Rad 

## 2019-05-19 NOTE — ED Provider Notes (Signed)
[facility] EMERGENCY DEPARTMENT Provider Note   CSN: [id number] Arrival date & time: 05/19/19  1348     History   Chief Complaint Chief Complaint  Patient presents with  . Weakness    HPI [name] is a 63 y.o. male.     HPI  This patient is a chronically ill and acutely critically ill 63 year old male with a known history of COPD, history of renal cell carcinoma, history of pulmonary fibrosis thought to be related to smoking-related interstitial lung disease.  He has known history of diabetes, substance abuse, cocaine abuse, hypertension and states that he has a DO NOT RESUSCITATE order.  Most recently the patient was evaluated on April 20, 2019 with an office visit with his pulmonology team, the nurse practitioner saw the patient for progressive fatigue and generalized weakness.  He had been tested negative for Covid June 5 of 2020, August 30 had a chest x-ray with no acute findings, grade 1 diastolic dysfunction seen on echocardiogram from 2016.  Evidently the patient is currently on 6 to 8 L of oxygen by nasal cannula constantly, reported in the medical record he had called the office yesterday asking if he could be seen or for advice or medication after having a fall, he noticed progressive left leg swelling as well as a head injury.  He was recommended to come to the hospital yesterday, he did not.  The patient is now confused, obtunded, not able to answer many questions, reportedly family members had called to have him transported to the hospital and was found to be hypotensive with a blood pressure of 80/60 prehospital.  He was tachypneic, hypoxic, not febrile.  The patient is unable to give me any more information, level 5 caveat applies.  Past Medical History:  Diagnosis Date  . Anxiety   . Arthritis   . Cholecystitis, acute 12/20/2013   Lap chole 6/5  . Cocaine abuse ([place])   . COPD (chronic obstructive pulmonary disease) ([place])   . Coronary atherosclerosis of native  coronary artery    a. 03/09/2013 Cath/PCI: LM nl, LAD: 50p, 20m (2.5x16 promus DES), LCX nl, OM1 min irregs, LPL/LPDA diff dzs, RCA nondom, mod diff dzs, EF 55%.  . Depression   . Essential hypertension, benign   . GERD (gastroesophageal reflux disease)   . Headache   . Hepatitis Late 1970s  . History of nephrolithiasis   . History of pneumonia   . History of stroke    Right MCA distribution, residual left-sided weakness  . Hyperlipidemia   . Noncompliance   . Renal cell carcinoma [facility])    Status post radical right nephrectomy August 2015  . Sleep apnea    On CPAP, 4L O2 no cpap at home yet  . Type 2 diabetes mellitus ([place]) 2011    Patient Active Problem List   Diagnosis Date Noted  . Sepsis with hypotension ([place]) 12/23/2018  . Chronic respiratory failure with hypoxia ([place]) 12/23/2018  . UTI symptoms 11/08/2018  . Abdominal distention 11/08/2018  . Chest pain 11/08/2018  . Hyperglycemia 11/08/2018  . AKI (acute kidney injury) ([place]) 11/08/2018  . Acute metabolic encephalopathy 0000000  . Polypharmacy 06/03/2018  . Diabetes mellitus due to underlying condition, uncontrolled, with hyperglycemia, with long-term current use of insulin ([place]) 06/03/2018  . Community acquired pneumonia 05/31/2018  . Acute on chronic respiratory failure with hypoxia ([place]) 05/31/2018  . Acute respiratory failure ([place])   . Decreased mobility and endurance 07/19/2015  . CKD (chronic kidney disease)  stage 3, GFR 30-59 ml/min ([name]) 07/06/2015  . Acute on chronic respiratory failure ([place]) 07/05/2015  . COPD with acute exacerbation ([place]) 07/05/2015  . Interstitial lung disease ([place]) 04/10/2015  . Cough syncope 04/10/2015  . Dyspnea 04/10/2015  . Atypical chest pain 02/28/2015  . Syncope 02/28/2015  . Orthostatic dizziness 02/25/2015  . Right leg pain 02/25/2015  . Right calf pain 02/14/2015  . Malaise and fatigue 02/14/2015  . 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[place]) 11/12/2014  . Smoking  11/12/2014  . History of stroke 10/24/2014  . Carotid disease, bilateral ([place]) 10/24/2014  . Orthostatic hypotension 10/24/2014  . Obstructive sleep apnea   . HLD (hyperlipidemia)   . Acute on chronic renal failure ([place])   . Renal cell carcinoma ([place])   . Metabolic encephalopathy   . Depression   . Anxiety state   . Noncompliance with medication regimen   . Carotid artery stenosis 10/08/2014  . Left sided numbness 09/26/2014  . Hyperreflexia 09/26/2014  . COLD (chronic obstructive lung disease) ([place]) 08/16/2014  . ILD (interstitial lung disease) ([place]) 08/16/2014  . Dizziness and giddiness 08/16/2014  . Other specified hypotension 08/16/2014  . Pulmonary edema   . Other emphysema ([place])   . Snoring 04/30/2014  . Fluid overload 02/22/2014  . Renal neoplasm 02/21/2014  . Preoperative cardiovascular examination 02/07/2014  . Renal mass, right 12/21/2013  . Coronary atherosclerosis of native coronary artery   . Substance abuse ([place])   . Hyperlipidemia   . Diabetes mellitus type 2, uncontrolled, with cardiovascular complications [id number]  . Tobacco abuse 06/01/2012  . H/O medication noncompliance 06/01/2012  . Cocaine abuse ([place]) 06/01/2012  . Essential hypertension, benign     Past Surgical History:  Procedure Laterality Date  . APPENDECTOMY  1970's  . CHOLECYSTECTOMY N/A 12/22/2013   Procedure: LAPAROSCOPIC CHOLECYSTECTOMY ;  Surgeon: [name], MD;  Location: [place];  Service: General;  Laterality: N/A;  . ENDARTERECTOMY Right 10/08/2014   Procedure: Right ENDARTERECTOMY CAROTID;  Surgeon: [name], MD;  Location: [place];  Service: Vascular;  Laterality: Right;  . LAPAROSCOPIC LYSIS OF ADHESIONS  02/21/2014   Procedure: LAPAROSCOPIC LYSIS OF ADHESIONS EXTINSIVE;  Surgeon: [name], MD;  Location: [facility];  Service: Urology;;  . LEFT HEART CATHETERIZATION WITH CORONARY ANGIOGRAM N/A 06/02/2012   Procedure: LEFT HEART CATHETERIZATION WITH CORONARY ANGIOGRAM;   Surgeon: [name], MD;  Location: [facility] CATH LAB;  Service: Cardiovascular;  Laterality: N/A;  . LEFT HEART CATHETERIZATION WITH CORONARY ANGIOGRAM N/A 03/09/2013   Procedure: LEFT HEART CATHETERIZATION WITH CORONARY ANGIOGRAM;  Surgeon: [name], MD;  Location: [facility] CATH LAB;  Service: Cardiovascular;  Laterality: N/A;  . LEFT HEART CATHETERIZATION WITH CORONARY ANGIOGRAM N/A 12/20/2013   Procedure: LEFT HEART CATHETERIZATION WITH CORONARY ANGIOGRAM;  Surgeon: [name], MD;  Location: [facility] CATH LAB;  Service: Cardiovascular;  Laterality: N/A;  . LUNG BIOPSY Left 05/18/2014   Procedure: LUNG BIOPSY left upper lobe & left lower lobe;  Surgeon: [name], MD;  Location: [place];  Service: Thoracic;  Laterality: Left;  . PATCH ANGIOPLASTY Right 10/08/2014   Procedure: PATCH ANGIOPLASTY Right Carotid;  Surgeon: [name], MD;  Location: [place];  Service: Vascular;  Laterality: Right;  . ROBOT ASSISTED LAPAROSCOPIC NEPHRECTOMY Right 02/21/2014   Procedure: ROBOTIC ASSISTED LAPAROSCOPIC RIGHT NEPHRECTOMY ;  Surgeon: [name], MD;  Location: [facility];  Service: Urology;  Laterality: Right;  [name] VIDEO ASSISTED THORACOSCOPY Left 05/18/2014   Procedure: LEFT VIDEO ASSISTED THORACOSCOPY;  Surgeon: [name], MD;  Location: [place];  Service: Thoracic;  Laterality: Left;  [name] VIDEO BRONCHOSCOPY  05/18/2014   Procedure: VIDEO BRONCHOSCOPY;  Surgeon: [name], MD;  Location: [place];  Service: Thoracic;;        Home Medications    Prior to Admission medications   Medication Sig Start Date End Date Taking? Authorizing Provider  ALPRAZolam Duanne Moron) 0.5 MG tablet Take 1 tablet (0.5 mg total) by mouth 3 (three) times daily as needed for anxiety. 06/03/18   Dhungel, Flonnie Overman, MD  atorvastatin (LIPITOR) 20 MG tablet Take 20 mg by mouth at bedtime. 04/26/18   [provider]  clopidogrel (PLAVIX) 75 MG tablet TAKE ONE TABLET BY MOUTH DAILY 02/14/19   Satira Sark, MD  diltiazem (CARDIZEM CD) 180 MG 24 hr capsule Take 1 capsule (180 mg total) by mouth daily. 03/08/19   Satira Sark, MD  docusate sodium (COLACE) 100 MG capsule Take 100 mg by mouth at bedtime.    [provider]  doxycycline (VIBRA-TABS) 100 MG tablet Take 1 tablet (100 mg total) by mouth 2 (two) times daily. 04/20/19   Lauraine Rinne, NP  esomeprazole (NEXIUM) 40 MG capsule Take 40 mg by mouth daily at 12 noon.    [provider]  FLUoxetine (PROZAC) 10 MG capsule Take 10 mg by mouth every morning. Take with 40 mg capsule = 50 mg total 03/01/18   [provider]  FLUoxetine (PROZAC) 40 MG capsule Take 40 mg by mouth daily. Take with 10 mg capsule = 50 mg total    [provider]  gabapentin (NEURONTIN) 300 MG capsule Take 600 mg by mouth 2 (two) times daily.    [provider]  Glycopyrrolate-Formoterol (BEVESPI AEROSPHERE) 9-4.8 MCG/ACT AERO Inhale 2 puffs into the lungs 2 (two) times daily. 03/30/18   Brand Males, MD  ibuprofen (ADVIL) 200 MG tablet Take 400 mg by mouth every 6 (six) hours as needed for fever, headache or mild pain.    [provider]  insulin aspart (NOVOLOG) 100 UNIT/ML injection Inject 22 Units into the skin 3 (three) times daily before meals.     [provider]  isosorbide mononitrate (IMDUR) 60 MG 24 hr tablet TAKE ONE TABLET BY MOUTH DAILY 01/26/19   Satira Sark, MD  LANTUS SOLOSTAR 100 UNIT/ML Solostar Pen Inject 112 Units into the skin 2 (two) times daily.  05/25/18   [provider]  linagliptin (TRADJENTA) 5 MG TABS tablet Take 5 mg by mouth daily.    [provider]  LORazepam (ATIVAN) 1 MG tablet Take 1 mg by mouth at bedtime.     [provider]  nicotine (NICODERM CQ - DOSED IN MG/24 HOURS) 14 mg/24hr patch Place 1 patch (14 mg total) onto the skin daily. 06/04/18   Dhungel, Flonnie Overman, MD  nitroGLYCERIN (NITROSTAT) 0.4 MG SL tablet Place 1 tablet (0.4 mg  total) under the tongue every 5 (five) minutes as needed for chest pain. PLACE ONE TABLET UNDER THE TONGUE EVERY FIVE MINUTES AS NEEDED FOR CHEST PAIN. IF 3 TABLETS ARE NECESSARY CALL 911 04/12/19   Satira Sark, MD  oxybutynin (DITROPAN-XL) 10 MG 24 hr tablet Take 10 mg by mouth daily. 02/22/18   [provider]  oxyCODONE-acetaminophen (PERCOCET) 10-325 MG tablet Take 1 tablet by mouth every 6 (six) hours as needed for pain. 06/03/18   Dhungel, Nishant, MD  pantoprazole (PROTONIX) 40 MG tablet Take 40 mg by  mouth daily.    [provider]  potassium chloride SA (K-DUR,KLOR-CON) 20 MEQ tablet Take 20 mEq by mouth 3 (three) times daily.    [provider]  pregabalin (LYRICA) 25 MG capsule Take 25 mg by mouth 2 (two) times daily. 02/14/19   [provider]  Probiotic Product (PROBIOTIC PO) Take 1 capsule by mouth daily.    [provider]  tamsulosin (FLOMAX) 0.4 MG CAPS capsule Take 0.4 mg by mouth daily.     [provider]  torsemide (DEMADEX) 20 MG tablet TAKE THREE TABLETS BY MOUTH TWICE DAILY Patient taking differently: Take 60 mg by mouth 2 (two) times daily.  06/24/18   Satira Sark, MD  zolpidem (AMBIEN) 5 MG tablet Take 5 mg by mouth at bedtime as needed. for insomnia 03/03/19   [provider]    Family History Family History  Problem Relation Age of Onset  . Cancer Mother        Thyroid - living in her 40's.  [name] Hypertension Other   . Diabetes Other   . Stroke Other   . Lung cancer Father   . CAD Father   . Cancer Maternal Grandmother        Breast  . Cancer Maternal Grandfather        Throat and stomach  . CAD Brother     Social History Social History   Tobacco Use  . Smoking status: Current Some Day Smoker    Packs/day: 1.00    Years: 41.00    Pack years: 41.00    Types: Cigarettes    Start date: 05/03/1972  . Smokeless tobacco: Never Used  . Tobacco comment: 2cigs per day as of 08/01/2018   Substance Use Topics  . Alcohol use: No    Alcohol/week: 0.0 standard drinks  . Drug use: No    Types: Cocaine    Comment: last used cocaine 11/2013. but is cocaine + this admit July 2015     Allergies   Oxycodone   Review of Systems Review of Systems  Unable to perform ROS: Patient unresponsive     Physical Exam Updated Vital Signs BP (!) 79/64   Pulse 81   Temp 97.9 F (36.6 C) (Oral)   Resp (!) 22   Ht 1.753 m (5\' 9" )   Wt 108.5 kg   SpO2 90%   BMI 35.32 kg/m   Physical Exam Vitals signs and nursing note reviewed.  Constitutional:      General: He is in acute distress.     Appearance: He is well-developed. He is ill-appearing and toxic-appearing.  HENT:     Head: Normocephalic and atraumatic.     Mouth/Throat:     Mouth: Mucous membranes are dry.     Pharynx: No oropharyngeal exudate.  Eyes:     General: No scleral icterus.       Right eye: No discharge.        Left eye: No discharge.     Conjunctiva/sclera: Conjunctivae normal.     Pupils: Pupils are equal, round, and reactive to light.  Neck:     Musculoskeletal: Normal range of motion and neck supple.     Thyroid: No thyromegaly.     Vascular: No JVD.  Cardiovascular:     Rate and Rhythm: Normal rate and regular rhythm.     Heart sounds: Normal heart sounds. No murmur. No friction rub. No gallop.   Pulmonary:     Effort: Pulmonary effort is  normal. No respiratory distress.     Breath sounds: Wheezing and rales present.  Abdominal:     General: Bowel sounds are normal. There is no distension.     Palpations: Abdomen is soft. There is no mass.     Tenderness: There is no abdominal tenderness.     Comments: Mild diffuse ttp, very soft, no palpable masses  Musculoskeletal: Normal range of motion.        General: Swelling present. No tenderness, deformity or signs of injury.     Right lower leg: No edema.     Left lower leg: Edema present.  Lymphadenopathy:     Cervical: No cervical adenopathy.   Skin:    General: Skin is warm and dry.     Findings: No erythema or rash.  Neurological:     Mental Status: He is alert.     Comments: Despite a history of a stroke the patient is able to lift all 4 extremities, the left leg seems to be the weakness.  The right and left arm seem to be normal, there is no drift or asterixis.  The patient is somnolent to obtunded but able to arouse to loud voice or painful stimuli, he is not able to sit up in the bed by himself.  He is extremely weak, he is somnolent, he has no obvious facial droop  Psychiatric:        Behavior: Behavior normal.      ED Treatments / Results  Labs (all labs ordered are listed, but only abnormal results are displayed) Labs Reviewed  LACTIC ACID, PLASMA - Abnormal; Notable for the following components:      Result Value   Lactic Acid, Venous 2.2 (*)    All other components within normal limits  COMPREHENSIVE METABOLIC PANEL - Abnormal; Notable for the following components:   Glucose, Bld 286 (*)    Creatinine, Ser 1.65 (*)    Calcium 7.9 (*)    Albumin 2.9 (*)    GFR calc non Af Amer 44 (*)    GFR calc Af Amer 50 (*)    All other components within normal limits  CBC WITH DIFFERENTIAL/PLATELET - Abnormal; Notable for the following components:   Hemoglobin 12.1 (*)    HCT 37.6 (*)    Abs Immature Granulocytes 0.09 (*)    All other components within normal limits  URINALYSIS, ROUTINE W REFLEX MICROSCOPIC - Abnormal; Notable for the following components:   Color, Urine STRAW (*)    Glucose, UA 50 (*)    All other components within normal limits  BLOOD GAS, ARTERIAL - Abnormal; Notable for the following components:   pH, Arterial 7.346 (*)    pCO2 arterial 52.1 (*)    pO2, Arterial 73.5 (*)    Acid-Base Excess 2.6 (*)    All other components within normal limits  CULTURE, BLOOD (ROUTINE X 2)  CULTURE, BLOOD (ROUTINE X 2)  URINE CULTURE  APTT  PROTIME-INR  AMMONIA  LACTIC ACID, PLASMA  TROPONIN I (HIGH  SENSITIVITY)  TROPONIN I (HIGH SENSITIVITY)    EKG EKG Interpretation  Date/Time:  Friday May 19 2019 14:09:28 EDT Ventricular Rate:  85 PR Interval:    QRS Duration: 98 QT Interval:  404 QTC Calculation: 464 R Axis:   -79 Text Interpretation: Sinus rhythm Supraventricular bigeminy Borderline prolonged PR interval Inferior infarct, old Lateral leads are also involved Baseline wander in lead(s) V3 V6 Confirmed by Noemi Chapel [phone]) on 05/19/2019 2:12:37 PM   Radiology  Dg Chest Port 1 View  Result Date: 05/19/2019 CLINICAL DATA:  Cough and chest pain.  History of lung carcinoma EXAM: PORTABLE CHEST 1 VIEW COMPARISON:  November 07, 2018 chest radiograph and February 20, 2019 chest radiograph FINDINGS: There is extensive scarring throughout the left mid and lower lung zone regions. There is slightly increased opacity in the left lower lobe compared to most recent study. The right lung is essentially clear. Heart is mildly enlarged with pulmonary vascularity normal. No adenopathy. There is aortic atherosclerosis. No bone lesions. IMPRESSION: Question a degree of pneumonia superimposed on scarring in the left mid lower lung zones. Right lung essentially clear. Heart mildly enlarged. No adenopathy evident. Aortic Atherosclerosis (ICD10-I70.0). Electronically Signed   By: Lowella Grip III M.D.   On: 05/19/2019 14:46    Procedures .Critical Care Performed by: Noemi Chapel, MD Authorized by: Noemi Chapel, MD   Critical care provider statement:    Critical care time (minutes):  35   Critical care time was exclusive of:  Separately billable procedures and treating other patients and teaching time   Critical care was necessary to treat or prevent imminent or life-threatening deterioration of the following conditions:  Sepsis and shock   Critical care was time spent personally by me on the following activities:  Blood draw for specimens, development of treatment plan with patient or  surrogate, discussions with consultants, evaluation of patient's response to treatment, examination of patient, obtaining history from patient or surrogate, ordering and performing treatments and interventions, ordering and review of laboratory studies, ordering and review of radiographic studies, pulse oximetry, re-evaluation of patient's condition and review of old charts   (including critical care time)  Medications Ordered in ED Medications  0.9 %  sodium chloride infusion ( Intravenous Restarted 05/19/19 1531)  sodium chloride 0.9 % bolus 1,000 mL (has no administration in time range)    And  sodium chloride 0.9 % bolus 1,000 mL (has no administration in time range)    And  sodium chloride 0.9 % bolus 200 mL (has no administration in time range)  vancomycin (VANCOCIN) IVPB 1000 mg/200 mL premix (has no administration in time range)  ceFEPIme (MAXIPIME) 2 g in sodium chloride 0.9 % 100 mL IVPB (has no administration in time range)     Initial Impression / Assessment and Plan / ED Course  I have reviewed the triage vital signs and the nursing notes.  Pertinent labs & imaging results that were available during my care of the patient were reviewed by me and considered in my medical decision making (see chart for details).  Clinical Course as of May 18 1557  Fri May 19, 2019  1522 There is some infiltrate in the left base though this could just be some chronic scarring.  Not altogether different from prior x-rays.  Lab work shows no significant leukocytosis, there is some renal dysfunction but seems to be baseline compared to prior.  Lactic acid is reportedly 2.2.  CT scan of the head is pending, no obvious source has been found, ABG shows no significant hypoxia or hypercapnia.  Ammonia is normal.  No obvious cause of the patient's altered mental status has been found at this point.   [BM]    Clinical Course User Index [BM] Noemi Chapel, MD       This very ill-appearing hypotensive  patient with altered mental status could be sick for many different reasons including injury from fall and head injury, consider infection, consider acidosis with a CO2  narcosis, consider overdose and drug use, metabolic abnormalities etc.  The patient will need to have full evaluation and is able to nod and consent to have this evaluation.  The patient has a persistent hypotension, 30cc/g of IV fluid based on ideal body weight were ordered, the patient is obese, he does have DO NOT RESUSCITATE orders as has been reported, he has a possible pneumonia, IV fluids have been ordered, antibiotics have been ordered to cover for healthcare associated pneumonia and at change of shift care signed out to Dr. Vanita [name] to follow-up results and disposition accordingly, anticipate admission high level of care  Final Clinical Impressions(s) / ED Diagnoses   Final diagnoses:  Septic shock ([place])  HCAP (healthcare-associated pneumonia)  Acute encephalopathy      Noemi Chapel, MD 05/19/19 1558

## 2019-05-19 NOTE — H&P (Signed)
TRH H&P   Patient Demographics:    [name], is a 63 y.o. male  MRN: [id number]   DOB - 1956-02-11  Admit Date - 05/19/2019  Outpatient Primary MD for the patient is Camille Bal, PA-C  Referring MD/NP/PA: Dr. Vanita Panda  Outpatient Specialists: Pulmonary Dr. Chase Caller for ILD  Patient coming from: Home  Chief Complaint  Patient presents with  . Weakness      HPI:    [name]  is a 63 y.o. male,  with history of renal cell carcinoma status post right radical nephrectomy in 2015, interstitial lung disease with chronic respiratory failure on 5-7 L home O2, diastolic CHF, CAD with history of stenting in 2014, history of stroke, hyperlipidemia, uncontrolled type 2 diabetes mellitus, shunt is known DNR, patient presents to ED secondary to generalized weakness, patient reports dyspnea at baseline, reports cough, but dry, reports poor appetite, he still smoking 3 to 4 cigarettes/day, patient reports fall, few days ago, as well receive generalized weakness, nothing focal, ported injury, but denies any loss of consciousness, as well reports worsening left lower extremity edema. - in ED patient was noted to be hypotensive, but he did respond to fluid bolus, had elevated lactic acid, chest x-ray with evidence of pneumonia, started on broad-spectrum antibiotics, CT head was obtained regarding his fall with head trauma with no acute finding, venous Dopplers were obtained regarding worsening lower extremity extremity edema, no evidence for acute DVT, I was called to admit.    Review of systems:    In addition to the HPI above,  No Fever-chills, reports generalized body pain and realized weakness No Headache, No changes with Vision or hearing, No problems swallowing food or Liquids, No Chest pain, Cough, he reports dyspnea at baseline No Abdominal pain, No Nausea or Vommitting,  Bowel movements are regular, No Blood in stool or Urine, No dysuria, No new skin rashes or bruises, No new joints pains-aches,  No new weakness, tingling, numbness in any extremity, No recent weight gain or loss, No polyuria, polydypsia or polyphagia, No significant Mental Stressors.  A full 10 point Review of Systems was done, except as stated above, all other Review of Systems were negative.   With Past History of the following :    Past Medical History:  Diagnosis Date  . Anxiety   . Arthritis   . Cholecystitis, acute 12/20/2013   Lap chole 6/5  . Cocaine abuse ([place])   . COPD (chronic obstructive pulmonary disease) ([place])   . Coronary atherosclerosis of native coronary artery    a. 03/09/2013 Cath/PCI: LM nl, LAD: 50p, 30m (2.5x16 promus DES), LCX nl, OM1 min irregs, LPL/LPDA diff dzs, RCA nondom, mod diff dzs, EF 55%.  . Depression   . Essential hypertension, benign   . GERD (gastroesophageal reflux disease)   . Headache   . Hepatitis Late 1970s  . History of nephrolithiasis   .  History of pneumonia   . History of stroke    Right MCA distribution, residual left-sided weakness  . Hyperlipidemia   . Noncompliance   . Renal cell carcinoma [facility])    Status post radical right nephrectomy August 2015  . Sleep apnea    On CPAP, 4L O2 no cpap at home yet  . Type 2 diabetes mellitus ([place]) 2011      Past Surgical History:  Procedure Laterality Date  . APPENDECTOMY  1970's  . CHOLECYSTECTOMY N/A 12/22/2013   Procedure: LAPAROSCOPIC CHOLECYSTECTOMY ;  Surgeon: [name], MD;  Location: [place];  Service: General;  Laterality: N/A;  . ENDARTERECTOMY Right 10/08/2014   Procedure: Right ENDARTERECTOMY CAROTID;  Surgeon: [name], MD;  Location: [place];  Service: Vascular;  Laterality: Right;  . LAPAROSCOPIC LYSIS OF ADHESIONS  02/21/2014   Procedure: LAPAROSCOPIC LYSIS OF ADHESIONS EXTINSIVE;  Surgeon: [name], MD;  Location: [facility];  Service: Urology;;  . LEFT HEART  CATHETERIZATION WITH CORONARY ANGIOGRAM N/A 06/02/2012   Procedure: LEFT HEART CATHETERIZATION WITH CORONARY ANGIOGRAM;  Surgeon: [name], MD;  Location: [facility] CATH LAB;  Service: Cardiovascular;  Laterality: N/A;  . LEFT HEART CATHETERIZATION WITH CORONARY ANGIOGRAM N/A 03/09/2013   Procedure: LEFT HEART CATHETERIZATION WITH CORONARY ANGIOGRAM;  Surgeon: [name], MD;  Location: [facility] CATH LAB;  Service: Cardiovascular;  Laterality: N/A;  . LEFT HEART CATHETERIZATION WITH CORONARY ANGIOGRAM N/A 12/20/2013   Procedure: LEFT HEART CATHETERIZATION WITH CORONARY ANGIOGRAM;  Surgeon: [name], MD;  Location: [facility] CATH LAB;  Service: Cardiovascular;  Laterality: N/A;  . LUNG BIOPSY Left 05/18/2014   Procedure: LUNG BIOPSY left upper lobe & left lower lobe;  Surgeon: [name], MD;  Location: [place];  Service: Thoracic;  Laterality: Left;  . PATCH ANGIOPLASTY Right 10/08/2014   Procedure: PATCH ANGIOPLASTY Right Carotid;  Surgeon: [name], MD;  Location: [place];  Service: Vascular;  Laterality: Right;  . ROBOT ASSISTED LAPAROSCOPIC NEPHRECTOMY Right 02/21/2014   Procedure: ROBOTIC ASSISTED LAPAROSCOPIC RIGHT NEPHRECTOMY ;  Surgeon: [name], MD;  Location: [facility];  Service: Urology;  Laterality: Right;  [name] VIDEO ASSISTED THORACOSCOPY Left 05/18/2014   Procedure: LEFT VIDEO ASSISTED THORACOSCOPY;  Surgeon: [name], MD;  Location: [place];  Service: Thoracic;  Laterality: Left;  [name] VIDEO BRONCHOSCOPY  05/18/2014   Procedure: VIDEO BRONCHOSCOPY;  Surgeon: [name], MD;  Location: [place];  Service: Thoracic;;      Social History:     Social History   Tobacco Use  . Smoking status: Current Some Day Smoker    Packs/day: 1.00    Years: 41.00    Pack years: 41.00    Types: Cigarettes    Start date: 05/03/1972  . Smokeless tobacco: Never Used  . Tobacco comment: 2cigs per day as of 08/01/2018  Substance Use Topics  . Alcohol use: No     Alcohol/week: 0.0 standard drinks        Family History :     Family History  Problem Relation Age of Onset  . Cancer Mother        Thyroid - living in her 68's.  [name] Hypertension Other   . Diabetes Other   . Stroke Other   . Lung cancer Father   . CAD Father   . Cancer Maternal Grandmother        Breast  . Cancer Maternal Grandfather        Throat and stomach  .  CAD Brother      Home Medications:   Prior to Admission medications   Medication Sig Start Date End Date Taking? Authorizing Provider  ALPRAZolam Duanne Moron) 0.5 MG tablet Take 1 tablet (0.5 mg total) by mouth 3 (three) times daily as needed for anxiety. 06/03/18   Dhungel, Flonnie Overman, MD  atorvastatin (LIPITOR) 20 MG tablet Take 20 mg by mouth at bedtime. 04/26/18   [provider]  clopidogrel (PLAVIX) 75 MG tablet TAKE ONE TABLET BY MOUTH DAILY 02/14/19   Satira Sark, MD  diltiazem (CARDIZEM CD) 180 MG 24 hr capsule Take 1 capsule (180 mg total) by mouth daily. 03/08/19   Satira Sark, MD  docusate sodium (COLACE) 100 MG capsule Take 100 mg by mouth at bedtime.    [provider]  doxycycline (VIBRA-TABS) 100 MG tablet Take 1 tablet (100 mg total) by mouth 2 (two) times daily. 04/20/19   Lauraine Rinne, NP  esomeprazole (NEXIUM) 40 MG capsule Take 40 mg by mouth daily at 12 noon.    [provider]  FLUoxetine (PROZAC) 10 MG capsule Take 10 mg by mouth every morning. Take with 40 mg capsule = 50 mg total 03/01/18   [provider]  FLUoxetine (PROZAC) 40 MG capsule Take 40 mg by mouth daily. Take with 10 mg capsule = 50 mg total    [provider]  gabapentin (NEURONTIN) 300 MG capsule Take 600 mg by mouth 2 (two) times daily.    [provider]  Glycopyrrolate-Formoterol (BEVESPI AEROSPHERE) 9-4.8 MCG/ACT AERO Inhale 2 puffs into the lungs 2 (two) times daily. 03/30/18   Brand Males, MD  insulin aspart (NOVOLOG) 100 UNIT/ML injection Inject 22 Units into the  skin 3 (three) times daily before meals.     [provider]  isosorbide mononitrate (IMDUR) 60 MG 24 hr tablet TAKE ONE TABLET BY MOUTH DAILY 01/26/19   Satira Sark, MD  LANTUS SOLOSTAR 100 UNIT/ML Solostar Pen Inject 112 Units into the skin 2 (two) times daily.  05/25/18   [provider]  linagliptin (TRADJENTA) 5 MG TABS tablet Take 5 mg by mouth daily.    [provider]  LORazepam (ATIVAN) 1 MG tablet Take 1 mg by mouth at bedtime.     [provider]  nicotine (NICODERM CQ - DOSED IN MG/24 HOURS) 14 mg/24hr patch Place 1 patch (14 mg total) onto the skin daily. 06/04/18   Dhungel, Flonnie Overman, MD  nitroGLYCERIN (NITROSTAT) 0.4 MG SL tablet Place 1 tablet (0.4 mg total) under the tongue every 5 (five) minutes as needed for chest pain. PLACE ONE TABLET UNDER THE TONGUE EVERY FIVE MINUTES AS NEEDED FOR CHEST PAIN. IF 3 TABLETS ARE NECESSARY CALL 911 04/12/19   Satira Sark, MD  oxybutynin (DITROPAN-XL) 10 MG 24 hr tablet Take 10 mg by mouth daily. 02/22/18   [provider]  oxyCODONE-acetaminophen (PERCOCET) 10-325 MG tablet Take 1 tablet by mouth every 6 (six) hours as needed for pain. 06/03/18   Dhungel, Nishant, MD  pantoprazole (PROTONIX) 40 MG tablet Take 40 mg by mouth daily.    [provider]  potassium chloride SA (K-DUR,KLOR-CON) 20 MEQ tablet Take 20 mEq by mouth 3 (three) times daily.    [provider]  pregabalin (LYRICA) 25 MG capsule Take 25 mg by mouth 2 (two) times daily. 02/14/19   [provider]  Probiotic Product (PROBIOTIC PO) Take 1 capsule by mouth daily.    [provider]  tamsulosin (FLOMAX) 0.4 MG CAPS capsule Take 0.4 mg by mouth daily.     [provider]  torsemide (DEMADEX) 20 MG tablet TAKE THREE TABLETS BY MOUTH TWICE DAILY Patient taking differently: Take 60 mg by mouth 2 (two) times daily.  06/24/18   Satira Sark, MD  zolpidem (AMBIEN) 5 MG tablet Take 5 mg by  mouth at bedtime as needed. for insomnia 03/03/19   [provider]     Allergies:     Allergies  Allergen Reactions  . Oxycodone Itching and Nausea Only    Pt is able to tolerate if taken with benadryl     Physical Exam:   Vitals  Blood pressure 114/73, pulse 82, temperature 97.9 F (36.6 C), temperature source Oral, resp. rate 20, height 5\' 9"  (1.753 m), weight 108.5 kg, SpO2 94 %.   1. General well developed male, laying in bed in no apparent distress  2. Normal affect and insight, Not Suicidal or Homicidal, Awake Alert, Oriented X 3.  3. No F.N deficits, ALL C.Nerves Intact, Strength 5/5 all 4 extremities, Sensation intact all 4 extremities, Plantars down going.  4. Ears and Eyes appear Normal, Conjunctivae clear, PERRLA. Moist Oral Mucosa.  5. Supple Neck, No JVD, No cervical lymphadenopathy appriciated, No Carotid Bruits.  6. Symmetrical Chest wall movement, Good air movement bilaterally, coarse respiratory sounds bilaterally.  7. RRR, No Gallops, Rubs or Murmurs, No Parasternal Heave.  8. Positive Bowel Sounds, Abdomen Soft, No tenderness, No organomegaly appriciated,No rebound -guarding or rigidity.  9.  No Cyanosis, Normal Skin Turgor, No Skin Rash or Bruise.  10. Good muscle tone,  joints appear normal , no effusions, Normal ROM.  11. No Palpable Lymph Nodes in Neck or Axillae     Data Review:    CBC Recent Labs  Lab 05/19/19 1423  WBC 8.3  HGB 12.1*  HCT 37.6*  PLT 195  MCV 85.8  MCH 27.6  MCHC 32.2  RDW 13.6  LYMPHSABS 1.2  MONOABS 0.5  EOSABS 0.1  BASOSABS 0.0   ------------------------------------------------------------------------------------------------------------------  Chemistries  Recent Labs  Lab 05/19/19 1423  NA 135  K 3.6  CL 100  CO2 25  GLUCOSE 286*  BUN 19  CREATININE 1.65*  CALCIUM 7.9*  AST 18  ALT 14  ALKPHOS 77  BILITOT 0.6    ------------------------------------------------------------------------------------------------------------------ estimated creatinine clearance is 55.6 mL/min (A) (by C-G formula based on SCr of 1.65 mg/dL (H)). ------------------------------------------------------------------------------------------------------------------ No results for input(s): TSH, T4TOTAL, T3FREE, THYROIDAB in the last 72 hours.  Invalid input(s): FREET3  Coagulation profile Recent Labs  Lab 05/19/19 1423  INR 1.0   ------------------------------------------------------------------------------------------------------------------- No results for input(s): DDIMER in the last 72 hours. -------------------------------------------------------------------------------------------------------------------  Cardiac Enzymes No results for input(s): CKMB, TROPONINI, MYOGLOBIN in the last 168 hours.  Invalid input(s): CK ------------------------------------------------------------------------------------------------------------------    Component Value Date/Time   BNP 44.9 12/23/2018 1311     ---------------------------------------------------------------------------------------------------------------  Urinalysis    Component Value Date/Time   COLORURINE STRAW (A) 05/19/2019 1410   APPEARANCEUR CLEAR 05/19/2019 1410   LABSPEC 1.005 05/19/2019 1410   PHURINE 5.0 05/19/2019 1410   GLUCOSEU 50 (A) 05/19/2019 1410   HGBUR NEGATIVE 05/19/2019 1410   BILIRUBINUR NEGATIVE 05/19/2019 1410   KETONESUR NEGATIVE 05/19/2019 1410   PROTEINUR NEGATIVE 05/19/2019 1410   UROBILINOGEN 0.2 02/28/2015 1310   NITRITE NEGATIVE 05/19/2019 1410   LEUKOCYTESUR NEGATIVE 05/19/2019 1410    ----------------------------------------------------------------------------------------------------------------   Imaging Results:    Ct Head Wo Contrast  Result  Date: 05/19/2019 CLINICAL DATA:  Trauma, lethargy, shortness of breath  EXAM: CT HEAD WITHOUT CONTRAST TECHNIQUE: Contiguous axial images were obtained from the base of the skull through the vertex without intravenous contrast. COMPARISON:  05/27/2018 FINDINGS: Brain: Stable brain atrophy pattern and encephalomalacia from a remote right frontal MCA territory infarct in the right cerebral hemisphere. No acute intracranial hemorrhage, new mass lesion, definite new infarction, hydrocephalus, midline shift, or extra-axial fluid collection. No focal mass effect or edema. Cisterns are patent. No cerebellar abnormality. Vascular: No hyperdense vessel. Skull: Normal. Negative for fracture or focal lesion. Sinuses/Orbits: No acute finding. Other: None. IMPRESSION: Stable remote right frontal MCA territory infarct and brain atrophy pattern. No interval change or acute process by noncontrast CT. Electronically Signed   By: Jerilynn Mages.  Shick M.D.   On: 05/19/2019 16:21   US Venous Img Lower Unilateral Left  Result Date: 05/19/2019 CLINICAL DATA:  Left lower extremity pain and edema for the past week following fall. History of kidney cancer. Evaluate for DVT. EXAM: LEFT LOWER EXTREMITY VENOUS DOPPLER ULTRASOUND TECHNIQUE: Gray-scale sonography with graded compression, as well as color Doppler and duplex ultrasound were performed to evaluate the lower extremity deep venous systems from the level of the common femoral vein and including the common femoral, femoral, profunda femoral, popliteal and calf veins including the posterior tibial, peroneal and gastrocnemius veins when visible. The superficial great saphenous vein was also interrogated. Spectral Doppler was utilized to evaluate flow at rest and with distal augmentation maneuvers in the common femoral, femoral and popliteal veins. COMPARISON:  None. FINDINGS: Contralateral Common Femoral Vein: Respiratory phasicity is normal and symmetric with the symptomatic side. No evidence of thrombus. Normal compressibility. Common Femoral Vein: No evidence of  thrombus. Normal compressibility, respiratory phasicity and response to augmentation. Saphenofemoral Junction: No evidence of thrombus. Normal compressibility and flow on color Doppler imaging. Profunda Femoral Vein: No evidence of thrombus. Normal compressibility and flow on color Doppler imaging. Femoral Vein: No evidence of thrombus. Normal compressibility, respiratory phasicity and response to augmentation. Popliteal Vein: No evidence of thrombus. Normal compressibility, respiratory phasicity and response to augmentation. Calf Veins: No evidence of thrombus. Normal compressibility and flow on color Doppler imaging. Superficial Great Saphenous Vein: No evidence of thrombus. Normal compressibility. Venous Reflux:  None. Other Findings: There is a moderate amount of subcutaneous edema at the level of the left lower leg and calf (representative image 31). No definable/drainable fluid collection. IMPRESSION: 1. No evidence of DVT within the left lower extremity. 2. Moderate amount of subcutaneous edema at the level of the left lower leg and calf without definable/drainable fluid collection. Electronically Signed   By: Sandi Mariscal M.D.   On: 05/19/2019 15:57   Dg Chest Port 1 View  Result Date: 05/19/2019 CLINICAL DATA:  Cough and chest pain.  History of lung carcinoma EXAM: PORTABLE CHEST 1 VIEW COMPARISON:  November 07, 2018 chest radiograph and February 20, 2019 chest radiograph FINDINGS: There is extensive scarring throughout the left mid and lower lung zone regions. There is slightly increased opacity in the left lower lobe compared to most recent study. The right lung is essentially clear. Heart is mildly enlarged with pulmonary vascularity normal. No adenopathy. There is aortic atherosclerosis. No bone lesions. IMPRESSION: Question a degree of pneumonia superimposed on scarring in the left mid lower lung zones. Right lung essentially clear. Heart mildly enlarged. No adenopathy evident. Aortic Atherosclerosis  (ICD10-I70.0). Electronically Signed   By: Lowella Grip III M.D.   On: 05/19/2019 14:46  Assessment & Plan:    Active Problems:   Essential hypertension, benign   COLD (chronic obstructive lung disease) ([name])   ILD (interstitial lung disease) ([name])   Depression   Anxiety state   Carotid disease, bilateral ([name])   CKD (chronic kidney disease) stage 3, GFR 30-59 ml/min ([name])   Acute on chronic respiratory failure with hypoxia ([name])   Diabetes mellitus due to underlying condition, uncontrolled, with hyperglycemia, with long-term current use of insulin ([name])   Sepsis ([place])   Sepsis -Sepsis present on admission, given patient is hypotensive, tachypneic with elevated lactic acid. -This is most likely in the setting of pneumonia neck, with elevated lactic acid, this is most likely related to pneumonia, in left mid lower lung zone, continue with broad-spectrum antibiotics. -UA is negative. -follow on Blood cultures. -Lactic acid has normalized -Hypotension responded to fluid challenge.  Acute hypoxic respiratory failure -Appears to be at baseline, in the setting of ILD and COPD -He is on 6 to 8 L nasal cannula, no active wheezing, no indication for steroids  Chronic pain syndrome -Continue with home regimen  Diabetes mellitus -We will resume Lantus at a lower dose, will add insulin sliding scale, and will hold oral agent  CKD stage III -At baseline, continue to monitor closely and avoid nephrotoxic medications  Depression/anxiety -Continue home medications  Tobacco abuse -Patient is not ready to quit, continue with nicotine patch  CAD/carotid artery disease -Continue with Plavix, statin  Chronic diastolic CHF -Closely for volume overload, resume torsemide once sepsis improves.  BPH -Continue with Flomax    DVT Prophylaxis Heparin - SCDs  AM Labs Ordered, also please review Full Orders  Family Communication: Admission, patients condition and plan of care  including tests being ordered have been discussed with the patient  who indicate understanding and agree with the plan and Code Status.  Code Status DNR  Likely DC to  Home  Condition GUARDED    Consults called: None  Admission status:   Inpatient  Time spent in minutes : 60 minutes   Phillips Climes M.D on 05/19/2019 at 7:20 PM  Between 7am to 7pm - Pager - [phone]. After 7pm go to [email or web address] - password [facility]  [facility] - Office  [phone]

## 2019-05-19 NOTE — Progress Notes (Signed)
Pt takes Lantus 115units BID at home. D/w Dr. Waldron Labs, we will do 70units BID for now.   Onnie Boer, PharmD, BCIDP, AAHIVP, CPP Infectious Disease Pharmacist 05/19/2019 9:16 PM

## 2019-05-19 NOTE — ED Notes (Signed)
Call to resp 

## 2019-05-19 NOTE — Telephone Encounter (Signed)
Patient is returning phone call. Patient phone number is [phone]. 

## 2019-05-19 NOTE — Telephone Encounter (Signed)
Spoke with pt, he states he is ok but his foot is swollen and he is unable to walk on it which causes him to fall. His Primary care doctor is telling him to go to the hospital but he refuses to go. I advised him that was the best thing to do. Pt understood. FYI MR

## 2019-05-19 NOTE — Telephone Encounter (Signed)
LMTCB with patient 

## 2019-05-19 NOTE — ED Notes (Signed)
Second call to resp for ABG

## 2019-05-19 NOTE — ED Notes (Signed)
Out of bed to [facility] commode

## 2019-05-19 NOTE — Progress Notes (Signed)
Pharmacy Antibiotic Note  [name] is a 63 y.o. male admitted on 05/19/2019 with pneumonia.  Pharmacy has been consulted for vancomycin and cefepime dosing.  Plan: vancomycin 2gm iv x 1 Vancomycin 1250mg  IV every 24 hours.  Goal trough 15-20 mcg/mL. cefepime 2gm iv q12h   Height: 5\' 9"  (175.3 cm) Weight: 239 lb 3.2 oz (108.5 kg) IBW/kg (Calculated) : 70.7  Temp (24hrs), Avg:97.9 F (36.6 C), Min:97.9 F (36.6 C), Max:97.9 F (36.6 C)  Recent Labs  Lab 05/19/19 1422 05/19/19 1423  WBC  --  8.3  CREATININE  --  1.65*  LATICACIDVEN 2.2*  --     Estimated Creatinine Clearance: 55.6 mL/min (A) (by C-G formula based on SCr of 1.65 mg/dL (H)).    Allergies  Allergen Reactions  . Oxycodone Itching and Nausea Only    Pt is able to tolerate if taken with benadryl    Antimicrobials this admission: 10/30 cefepime >>  10/30 vancomycin >>    Microbiology results: 10/30 BCx: sent  10/30 UCx: sent    Thank you for allowing pharmacy to be a part of this patient's care.  [name] [name] 05/19/2019 4:16 PM

## 2019-05-19 NOTE — ED Notes (Signed)
Pt is much improved   Standing next to bed, speaking to family

## 2019-05-19 NOTE — ED Notes (Signed)
Admitting physician in to assess pt He states that the CT should be cancelled   Call to [place] to request cancel as it is marked in process and pt in the ED dept

## 2019-05-19 NOTE — ED Notes (Signed)
Critical   Lactic acid 2.2  Dr Sabra Heck informed

## 2019-05-19 NOTE — Sepsis Progress Note (Signed)
Notified bedside nurse of need to administer fluid bolus.  

## 2019-05-19 NOTE — ED Triage Notes (Signed)
Pt has lung Ca, kidney Ca, CHF, COPD,  Has had fluid buildup for the last 2 weeks  Pt states DNR is on computer here  Increasingly lethargic as well as short of breath Normally on 6-8 on O2 at home  Also hypotensive  Pt has been given 300cc bolus

## 2019-05-19 NOTE — Telephone Encounter (Signed)
Yes - correct advice  Thanks  MR

## 2019-05-20 DIAGNOSIS — I779 Disorder of arteries and arterioles, unspecified: Secondary | ICD-10-CM

## 2019-05-20 DIAGNOSIS — A419 Sepsis, unspecified organism: Secondary | ICD-10-CM | POA: Diagnosis not present

## 2019-05-20 DIAGNOSIS — Z794 Long term (current) use of insulin: Secondary | ICD-10-CM

## 2019-05-20 DIAGNOSIS — I10 Essential (primary) hypertension: Secondary | ICD-10-CM

## 2019-05-20 DIAGNOSIS — J9621 Acute and chronic respiratory failure with hypoxia: Secondary | ICD-10-CM | POA: Diagnosis not present

## 2019-05-20 DIAGNOSIS — E0865 Diabetes mellitus due to underlying condition with hyperglycemia: Secondary | ICD-10-CM

## 2019-05-20 DIAGNOSIS — J849 Interstitial pulmonary disease, unspecified: Secondary | ICD-10-CM

## 2019-05-20 LAB — BASIC METABOLIC PANEL
Anion gap: 9 (ref 5–15)
BUN: 15 mg/dL (ref 8–23)
CO2: 22 mmol/L (ref 22–32)
Calcium: 7.7 mg/dL — ABNORMAL LOW (ref 8.9–10.3)
Chloride: 107 mmol/L (ref 98–111)
Creatinine, Ser: 1.32 mg/dL — ABNORMAL HIGH (ref 0.61–1.24)
GFR calc Af Amer: >60 mL/min (ref >60)
GFR calc non Af Amer: 57 mL/min — ABNORMAL LOW (ref >60)
Glucose, Bld: 242 mg/dL — ABNORMAL HIGH (ref 70–99)
Potassium: 3.7 mmol/L (ref 3.5–5.1)
Sodium: 138 mmol/L (ref 135–145)

## 2019-05-20 LAB — CBC
HCT: 38.6 % — ABNORMAL LOW (ref 39.0–52.0)
Hemoglobin: 12 g/dL — ABNORMAL LOW (ref 13.0–17.0)
MCH: 27.2 pg (ref 26.0–34.0)
MCHC: 31.1 g/dL (ref 30.0–36.0)
MCV: 87.5 fL (ref 80.0–100.0)
Platelets: 177 10*3/uL (ref 150–400)
RBC: 4.41 MIL/uL (ref 4.22–5.81)
RDW: 13.5 % (ref 11.5–15.5)
WBC: 7 10*3/uL (ref 4.0–10.5)
nRBC: 0 % (ref 0.0–0.2)

## 2019-05-20 LAB — HEMOGLOBIN A1C
Hgb A1c MFr Bld: 13.4 % — ABNORMAL HIGH (ref 4.8–5.6)
Mean Plasma Glucose: 337.88 mg/dL

## 2019-05-20 LAB — URINE CULTURE: Culture: NO GROWTH

## 2019-05-20 LAB — GLUCOSE, CAPILLARY
Glucose-Capillary: 222 mg/dL — ABNORMAL HIGH (ref 70–99)
Glucose-Capillary: 229 mg/dL — ABNORMAL HIGH (ref 70–99)
Glucose-Capillary: 223 mg/dL — ABNORMAL HIGH (ref 70–99)
Glucose-Capillary: 203 mg/dL — ABNORMAL HIGH (ref 70–99)

## 2019-05-20 MED ORDER — ARFORMOTEROL TARTRATE 15 MCG/2ML IN NEBU
15.0000 ug | INHALATION_SOLUTION | Freq: Two times a day (BID) | RESPIRATORY_TRACT | Status: DC
  Administered 2019-05-20 – 2019-05-21 (×2): 15 ug via RESPIRATORY_TRACT
  Filled 2019-05-20 (×2): qty 2

## 2019-05-20 MED ORDER — INSULIN ASPART 100 UNIT/ML ~~LOC~~ SOLN
14.0000 [IU] | Freq: Three times a day (TID) | SUBCUTANEOUS | Status: DC
  Administered 2019-05-20 – 2019-05-21 (×5): 14 [IU] via SUBCUTANEOUS

## 2019-05-20 MED ORDER — TORSEMIDE 20 MG PO TABS: 60.0000 mg | ORAL_TABLET | Freq: Every day | ORAL | Status: DC

## 2019-05-20 MED ORDER — POTASSIUM CHLORIDE CRYS ER 20 MEQ PO TBCR
20.0000 meq | EXTENDED_RELEASE_TABLET | Freq: Two times a day (BID) | ORAL | Status: DC
  Administered 2019-05-20 – 2019-05-21 (×3): 20 meq via ORAL
  Filled 2019-05-20 (×3): qty 1

## 2019-05-20 MED ORDER — PREGABALIN 25 MG PO CAPS
25.0000 mg | ORAL_CAPSULE | Freq: Two times a day (BID) | ORAL | Status: DC
  Administered 2019-05-20 – 2019-05-21 (×3): 25 mg via ORAL
  Filled 2019-05-20 (×3): qty 1

## 2019-05-20 MED ORDER — LORAZEPAM 0.5 MG PO TABS
0.5000 mg | ORAL_TABLET | Freq: Four times a day (QID) | ORAL | Status: DC | PRN
  Administered 2019-05-20: 0.5 mg via ORAL
  Filled 2019-05-20: qty 1

## 2019-05-20 MED ORDER — UMECLIDINIUM BROMIDE 62.5 MCG/INH IN AEPB
1.0000 | INHALATION_SPRAY | Freq: Every day | RESPIRATORY_TRACT | Status: DC
  Administered 2019-05-21: 1 via RESPIRATORY_TRACT
  Filled 2019-05-20: qty 7

## 2019-05-20 MED ORDER — NITROGLYCERIN 0.4 MG SL SUBL: 0.4000 mg | SUBLINGUAL_TABLET | SUBLINGUAL | Status: DC | PRN

## 2019-05-20 MED ORDER — TORSEMIDE 20 MG PO TABS
60.0000 mg | ORAL_TABLET | Freq: Every day | ORAL | Status: DC
  Administered 2019-05-21: 60 mg via ORAL
  Filled 2019-05-20: qty 3

## 2019-05-20 MED ORDER — FLUOXETINE HCL 20 MG PO CAPS
50.0000 mg | ORAL_CAPSULE | Freq: Every morning | ORAL | Status: DC
  Administered 2019-05-20 – 2019-05-21 (×2): 50 mg via ORAL
  Filled 2019-05-20: qty 1

## 2019-05-20 MED ORDER — ISOSORBIDE MONONITRATE ER 30 MG PO TB24
15.0000 mg | ORAL_TABLET | Freq: Every day | ORAL | Status: DC
  Administered 2019-05-20 – 2019-05-21 (×2): 15 mg via ORAL
  Filled 2019-05-20 (×2): qty 1

## 2019-05-20 MED ORDER — INSULIN GLARGINE 100 UNIT/ML ~~LOC~~ SOLN
80.0000 [IU] | Freq: Two times a day (BID) | SUBCUTANEOUS | Status: DC
  Administered 2019-05-20 (×2): 80 [IU] via SUBCUTANEOUS
  Filled 2019-05-20 (×5): qty 0.8

## 2019-05-20 MED ORDER — TORSEMIDE 20 MG PO TABS: 60.0000 mg | ORAL_TABLET | Freq: Three times a day (TID) | ORAL | Status: DC

## 2019-05-20 MED ORDER — ZOLPIDEM TARTRATE 5 MG PO TABS: 5.0000 mg | ORAL_TABLET | Freq: Every evening | ORAL | Status: DC | PRN

## 2019-05-20 NOTE — Plan of Care (Signed)
  Problem: Acute Rehab PT Goals(only PT should resolve) Goal: Pt Will Go Supine/Side To Sit Outcome: Progressing Flowsheets (Taken 05/20/2019 1158) Pt will go Supine/Side to Sit: with supervision Goal: Pt Will Go Sit To Supine/Side Outcome: Progressing Flowsheets (Taken 05/20/2019 1158) Pt will go Sit to Supine/Side: with supervision Goal: Patient Will Transfer Sit To/From Stand Outcome: Progressing Flowsheets (Taken 05/20/2019 1158) Patient will transfer sit to/from stand: with min guard assist Goal: Pt Will Transfer Bed To Chair/Chair To Bed Outcome: Progressing Flowsheets (Taken 05/20/2019 1158) Pt will Transfer Bed to Chair/Chair to Bed: min guard assist Goal: Pt Will Ambulate Outcome: Progressing Flowsheets (Taken 05/20/2019 1158) Pt will Ambulate:  100 feet  with min guard assist  with least restrictive assistive device   Pamala Hurry D. Hartnett-Rands, MS, PT Per [place] [phone] 05/20/2019

## 2019-05-20 NOTE — Evaluation (Signed)
Physical Therapy Evaluation Patient Details Name: [name] MRN: [id number] DOB: 05/21/1956 Today's Date: 05/20/2019   History of Present Illness  Patient is a 63 year old male admitted 05/19/2019 with diagnosis of acute on chronic respiratory failure symptoms of general weakness, poor appetite, fall at home, no loss of consciousness. PMH: renal cell carcinoma status post right radical nephrectomy in 2015, interstitial lung disease with chronic respiratory failure on 5-7 L home O2, diastolic CHF, CAD with history of stenting in 2014, history of stroke, hyperlipidemia, uncontrolled type 2 diabetes mellitus, shunt, current smoker.   Clinical Impression  Pt admitted with above diagnosis. Slow, labored gait, transfers and bed mobility requiring supervision to min guard. Patient desaturates to below 90% O2 sats with activity with increased heart rate. Patient then began complaining of light-headedness and "floating" feeling with unsteadiness on feet and requiring PT assistance to remain standing. Patient required sitting rest break on 6L O2 prior to be able to ambulate the remaining 10 feet to the recliner with 6 L O2. Nursing made aware. Pt currently with functional limitations due to the deficits listed below (see PT Problem List). Pt will benefit from skilled PT to increase their independence and safety with mobility to allow discharge to the venue listed below.       Follow Up Recommendations Home health PT;Supervision for mobility/OOB    Equipment Recommendations  None recommended by PT    Recommendations for Other Services       Precautions / Restrictions Precautions Precautions: Fall Precaution Comments: monitor O2 sats, HR and symptoms during ambulation. Restrictions Weight Bearing Restrictions: No      Mobility  Bed Mobility Overal bed mobility: Needs Assistance Bed Mobility: Supine to Sit;Sit to Supine     Supine to sit: Min guard Sit to supine: Min guard       Transfers Overall transfer level: Needs assistance   Transfers: Sit to/from [employer] Transfers Sit to Stand: Min guard;Supervision Stand pivot transfers: Min guard       General transfer comment: min guard for safety due to complaints of lightheadedness and leg weakness. Increased effort to achieve power up for sit<>stand transfer.  Ambulation/Gait Ambulation/Gait assistance: Min guard;Min assist Gait Distance (Feet): 100 Feet Assistive device: Rolling walker (2 wheeled) Gait Pattern/deviations: Step-through pattern;Decreased step length - right;Decreased step length - left;Decreased stride length;Trunk flexed Gait velocity: decreased   General Gait Details: Slow, labored gait. Patient desaturates to below 90% O2 sats with activity with increased heart rate. Patient then began complaining of light-headedness and "floating" feeling with unsteadiness on feet and requiring PT assistance to remain standing. Patient required sitting rest break on 6L O2 prior to being able to ambulate the remaining 10 feet to the recliner with 6 L O2.  Stairs            Wheelchair Mobility    Modified Rankin (Stroke Patients Only)       Balance Overall balance assessment: Needs assistance Sitting-balance support: Bilateral upper extremity supported;Feet supported Sitting balance-Leahy Scale: Good     Standing balance support: Bilateral upper extremity supported;During functional activity Standing balance-Leahy Scale: Fair Standing balance comment: fair with 6L O2 and RW                             Pertinent Vitals/Pain Pain Assessment: 0-10 Pain Score: 7  Pain Location: in chest and legs Pain Descriptors / Indicators: Heaviness;Other (Comment)(weak) Pain Intervention(s): Limited activity within patient's tolerance;Monitored  during session    [place] expects to be discharged to:: Private residence Living Arrangements: Parent(mother lives with  him.) Available Help at Discharge: Personal care attendant;Available PRN/intermittently(Personal care assistant 5 days a week; 3 hours per day.) Type of Home: Mobile home Home Access: Ramped entrance     Home Layout: One level Home Equipment: Grab bars - tub/shower;Grab bars - toilet;Shower seat;Shower seat - built in;Hand held Tourist information centre manager - 4 wheels;Electric scooter;Wheelchair - manual;Cane - single point;Cane - quad      Prior Function Level of Independence: Needs assistance   Gait / Transfers Assistance Needed: walking household distances with SPCon 6L O2  ADL's / Homemaking Assistance Needed: assist for dress, bathing, light house cleaning.  Comments: drive short distance to grocery store using electric scooter to get groceries or having someone get his groceries for him.     Hand Dominance   Dominant Hand: Right    Extremity/Trunk Assessment   Upper Extremity Assessment Upper Extremity Assessment: Generalized weakness    Lower Extremity Assessment Lower Extremity Assessment: Generalized weakness    Cervical / Trunk Assessment Cervical / Trunk Assessment: Kyphotic  Communication   Communication: No difficulties  Cognition Arousal/Alertness: Awake/alert Behavior During Therapy: WFL for tasks assessed/performed Overall Cognitive Status: Within Functional Limits for tasks assessed                                        General Comments      Exercises     Assessment/Plan    PT Assessment Patient needs continued PT services  PT Problem List Decreased strength;Decreased mobility;Decreased activity tolerance;Decreased balance;Pain       PT Treatment Interventions DME instruction;Therapeutic activities;Gait training;Therapeutic exercise;Patient/family education;Balance training    PT Goals (Current goals can be found in the Care Plan section)  Acute Rehab PT Goals Patient Stated Goal: not hurt; breathe better. PT Goal Formulation: With  patient Time For Goal Achievement: 06/03/19 Potential to Achieve Goals: Fair    Frequency Min 3X/week   Barriers to discharge        Co-evaluation               AM-PAC PT "6 Clicks" Mobility  Outcome Measure Help needed turning from your back to your side while in a flat bed without using bedrails?: A Little Help needed moving from lying on your back to sitting on the side of a flat bed without using bedrails?: A Little Help needed moving to and from a bed to a chair (including a wheelchair)?: A Little Help needed standing up from a chair using your arms (e.g., wheelchair or bedside chair)?: A Little Help needed to walk in hospital room?: A Little Help needed climbing 3-5 steps with a railing? : A Little 6 Click Score: 18    End of Session   Activity Tolerance: Patient limited by fatigue Patient left: in chair;with call bell/phone within reach Nurse Communication: Mobility status;Precautions PT Visit Diagnosis: Unsteadiness on feet (R26.81);Other abnormalities of gait and mobility (R26.89);History of falling (Z91.81);Muscle weakness (generalized) (M62.81)    Time: 1100-1130 PT Time Calculation (min) (ACUTE ONLY): 30 min   Charges:   PT Evaluation $PT Eval Moderate Complexity: 1 Mod PT Treatments $Gait Training: 8-22 mins        Floria Raveling. Hartnett-Rands, MS, PT Per [place] [phone] 05/20/2019, 11:56 AM

## 2019-05-20 NOTE — Progress Notes (Signed)
PROGRESS NOTE  [name]  [id number]  DOB: May 30, 1956  DOA: 05/19/2019 PCP: Camille Bal, PA-C   Brief Admission Hx:  63 y.o. male, with history of renal cell carcinoma status post right radical nephrectomy in 2015, interstitial lung disease with chronic respiratory failure on 5-7 L home O2, diastolic CHF, CAD with history of stenting in 2014, history of stroke, hyperlipidemia, uncontrolled type 2 diabetes mellitus, shunt is known DNR, patient presents to ED secondary to generalized weakness.  He was admitted with pneumonia.    MDM/Assessment & Plan:   1. Sepsis - sepsis physiology is improving with treatments. Continue current therapies.  2. Acute on chronic hypoxic respiratory failure - He remains on 6-8L [place].  3. Chronic pain syndrome - continue home medications for pain management.  4. Type 2 DM - monitor CBG and resumed home lantus. SSI coverage ordered.  5. Tobacco abuse - nicotine patch ordered. 6. CAD - continue plavix, imdur, and statin, currently stable.  7. Chronic diastolic heart failure - plan to resume torsemide tomorrow.  8. BPH - continue flomax.   DVT prophylaxis: heparin, SCDs Code Status: DNR  Family Communication: patient updated bedside Disposition Plan: inpatient    Consultants:    Procedures:    Antimicrobials:     Subjective: Pt reports that he is very tired and SOB.  He had some chest pain earlier this morning, resolved with his NTG.   Objective: Vitals:   05/19/19 2039 05/19/19 2041 05/19/19 2045 05/20/19 0520  BP:  (!) 104/93  97/68  Pulse:  92  79  Resp:    20  Temp:      TempSrc:      SpO2: 94% 97%  95%  Weight:   114.2 kg   Height:   5\' 7"  (1.702 m)     Intake/Output Summary (Last 24 hours) at 05/20/2019 1324 Last data filed at 05/20/2019 [id number] Gross per 24 hour  Intake 240 ml  Output -  Net 240 ml   Filed Weights   05/19/19 1433 05/19/19 2045  Weight: 108.5 kg 114.2 kg     REVIEW OF SYSTEMS  As per  history otherwise all reviewed and reported negative  Exam:  General exam: chronically ill appearing male, lying in bed, easily arousable.  Respiratory system: rales LLL. No increased work of breathing. Cardiovascular system: S1 & S2 heard. No JVD, murmurs, gallops, clicks or pedal edema. Gastrointestinal system: Abdomen is nondistended, soft and nontender. Normal bowel sounds heard. Central nervous system: Alert and oriented. No focal neurological deficits. Extremities: no CCE.  Data Reviewed: Basic Metabolic Panel: Recent Labs  Lab 05/19/19 1423 05/20/19 0511  NA 135 138  K 3.6 3.7  CL 100 107  CO2 25 22  GLUCOSE 286* 242*  BUN 19 15  CREATININE 1.65* 1.32*  CALCIUM 7.9* 7.7*   Liver Function Tests: Recent Labs  Lab 05/19/19 1423  AST 18  ALT 14  ALKPHOS 77  BILITOT 0.6  PROT 6.7  ALBUMIN 2.9*   No results for input(s): LIPASE, AMYLASE in the last 168 hours. Recent Labs  Lab 05/19/19 1423  AMMONIA 25   CBC: Recent Labs  Lab 05/19/19 1423 05/20/19 0511  WBC 8.3 7.0  NEUTROABS 6.3  --   HGB 12.1* 12.0*  HCT 37.6* 38.6*  MCV 85.8 87.5  PLT 195 177   Cardiac Enzymes: No results for input(s): CKTOTAL, CKMB, CKMBINDEX, TROPONINI in the last 168 hours. CBG (last 3)  Recent Labs    05/19/19  2042 05/20/19 0725 05/20/19 1123  GLUCAP 238* 222* 229*   Recent Results (from the past 240 hour(s))  Blood Culture (routine x 2)     Status: None (Preliminary result)   Collection Time: 05/19/19  2:22 PM   Specimen: BLOOD RIGHT HAND  Result Value Ref Range Status   Specimen Description BLOOD RIGHT HAND  Final   Special Requests   Final    BOTTLES DRAWN AEROBIC AND ANAEROBIC Blood Culture adequate volume   Culture   Final    NO GROWTH < 24 HOURS Performed at [facility], [address] [place], [place] [address]    Report Status PENDING  Incomplete  Blood Culture (routine x 2)     Status: None (Preliminary result)   Collection Time: 05/19/19  2:25 PM    Specimen: BLOOD LEFT ARM  Result Value Ref Range Status   Specimen Description BLOOD LEFT ARM  Final   Special Requests   Final    BOTTLES DRAWN AEROBIC AND ANAEROBIC Blood Culture adequate volume   Culture   Final    NO GROWTH < 24 HOURS Performed at [facility], [address] [place], [place] [address]    Report Status PENDING  Incomplete  SARS CORONAVIRUS 2 (TAT 6-24 HRS) Nasopharyngeal Nasopharyngeal Swab     Status: None   Collection Time: 05/19/19  5:59 PM   Specimen: Nasopharyngeal Swab  Result Value Ref Range Status   SARS Coronavirus 2 NEGATIVE NEGATIVE Final    Comment: (NOTE) SARS-CoV-2 target nucleic acids are NOT DETECTED. The SARS-CoV-2 RNA is generally detectable in upper and lower respiratory specimens during the acute phase of infection. Negative results do not preclude SARS-CoV-2 infection, do not rule out co-infections with other pathogens, and should not be used as the sole basis for treatment or other patient management decisions. Negative results must be combined with clinical observations, patient history, and epidemiological information. The expected result is Negative. Fact Sheet for Patients: SugarRoll.be Fact Sheet for Healthcare Providers: https://www.woods-mathews.com/ This test is not yet approved or cleared by the [place] FDA and  has been authorized for detection and/or diagnosis of SARS-CoV-2 by FDA under an Emergency Use Authorization (EUA). This EUA will remain  in effect (meaning this test can be used) for the duration of the COVID-19 declaration under Section 56 4(b)(1) of the Act, 21 U.S.C. section 360bbb-3(b)(1), unless the authorization is terminated or revoked sooner. Performed at [facility], [place] [address], [place], [place] [address]      Studies: Ct Head Wo Contrast  Result Date: 05/19/2019 CLINICAL DATA:  Trauma, lethargy, shortness of breath EXAM: CT HEAD WITHOUT CONTRAST  TECHNIQUE: Contiguous axial images were obtained from the base of the skull through the vertex without intravenous contrast. COMPARISON:  05/27/2018 FINDINGS: Brain: Stable brain atrophy pattern and encephalomalacia from a remote right frontal MCA territory infarct in the right cerebral hemisphere. No acute intracranial hemorrhage, new mass lesion, definite new infarction, hydrocephalus, midline shift, or extra-axial fluid collection. No focal mass effect or edema. Cisterns are patent. No cerebellar abnormality. Vascular: No hyperdense vessel. Skull: Normal. Negative for fracture or focal lesion. Sinuses/Orbits: No acute finding. Other: None. IMPRESSION: Stable remote right frontal MCA territory infarct and brain atrophy pattern. No interval change or acute process by noncontrast CT. Electronically Signed   By: Jerilynn Mages.  Shick M.D.   On: 05/19/2019 16:21   US Venous Img Lower Unilateral Left  Result Date: 05/19/2019 CLINICAL DATA:  Left lower extremity pain and edema  for the past week following fall. History of kidney cancer. Evaluate for DVT. EXAM: LEFT LOWER EXTREMITY VENOUS DOPPLER ULTRASOUND TECHNIQUE: Gray-scale sonography with graded compression, as well as color Doppler and duplex ultrasound were performed to evaluate the lower extremity deep venous systems from the level of the common femoral vein and including the common femoral, femoral, profunda femoral, popliteal and calf veins including the posterior tibial, peroneal and gastrocnemius veins when visible. The superficial great saphenous vein was also interrogated. Spectral Doppler was utilized to evaluate flow at rest and with distal augmentation maneuvers in the common femoral, femoral and popliteal veins. COMPARISON:  None. FINDINGS: Contralateral Common Femoral Vein: Respiratory phasicity is normal and symmetric with the symptomatic side. No evidence of thrombus. Normal compressibility. Common Femoral Vein: No evidence of thrombus. Normal  compressibility, respiratory phasicity and response to augmentation. Saphenofemoral Junction: No evidence of thrombus. Normal compressibility and flow on color Doppler imaging. Profunda Femoral Vein: No evidence of thrombus. Normal compressibility and flow on color Doppler imaging. Femoral Vein: No evidence of thrombus. Normal compressibility, respiratory phasicity and response to augmentation. Popliteal Vein: No evidence of thrombus. Normal compressibility, respiratory phasicity and response to augmentation. Calf Veins: No evidence of thrombus. Normal compressibility and flow on color Doppler imaging. Superficial Great Saphenous Vein: No evidence of thrombus. Normal compressibility. Venous Reflux:  None. Other Findings: There is a moderate amount of subcutaneous edema at the level of the left lower leg and calf (representative image 31). No definable/drainable fluid collection. IMPRESSION: 1. No evidence of DVT within the left lower extremity. 2. Moderate amount of subcutaneous edema at the level of the left lower leg and calf without definable/drainable fluid collection. Electronically Signed   By: Sandi Mariscal M.D.   On: 05/19/2019 15:57   Dg Chest Port 1 View  Result Date: 05/19/2019 CLINICAL DATA:  Cough and chest pain.  History of lung carcinoma EXAM: PORTABLE CHEST 1 VIEW COMPARISON:  November 07, 2018 chest radiograph and February 20, 2019 chest radiograph FINDINGS: There is extensive scarring throughout the left mid and lower lung zone regions. There is slightly increased opacity in the left lower lobe compared to most recent study. The right lung is essentially clear. Heart is mildly enlarged with pulmonary vascularity normal. No adenopathy. There is aortic atherosclerosis. No bone lesions. IMPRESSION: Question a degree of pneumonia superimposed on scarring in the left mid lower lung zones. Right lung essentially clear. Heart mildly enlarged. No adenopathy evident. Aortic Atherosclerosis (ICD10-I70.0).  Electronically Signed   By: Lowella Grip III M.D.   On: 05/19/2019 14:46     Scheduled Meds: . arformoterol  15 mcg Nebulization BID  . atorvastatin  20 mg Oral QHS  . clopidogrel  75 mg Oral Daily  . docusate sodium  100 mg Oral QHS  . FLUoxetine  50 mg Oral q morning - 10a  . heparin  5,000 Units Subcutaneous Q8H  . insulin aspart  0-15 Units Subcutaneous TID WC  . insulin aspart  0-5 Units Subcutaneous QHS  . insulin aspart  14 Units Subcutaneous TID WC  . insulin glargine  80 Units Subcutaneous BID  . isosorbide mononitrate  15 mg Oral Daily  . nicotine  14 mg Transdermal Daily  . oxybutynin  10 mg Oral Daily  . pantoprazole  40 mg Oral Daily  . potassium chloride SA  20 mEq Oral BID  . pregabalin  25 mg Oral BID  . tamsulosin  0.4 mg Oral Daily  . [START ON 05/21/2019] torsemide  60 mg Oral Daily  . umeclidinium bromide  1 puff Inhalation Daily   Continuous Infusions: . ceFEPime (MAXIPIME) IV 2 g (05/20/19 1000)  . sodium chloride Stopped (05/19/19 1704)  . vancomycin (VANCOCIN) 1250 mg/246mL IVPB (Vial-Mate [employer])      Active Problems:   Essential hypertension, benign   COLD (chronic obstructive lung disease) ([name])   ILD (interstitial lung disease) ([name])   Depression   Anxiety state   Carotid disease, bilateral ([name])   CKD (chronic kidney disease) stage 3, GFR 30-59 ml/min ([name])   Acute on chronic respiratory failure with hypoxia ([place])   Diabetes mellitus due to underlying condition, uncontrolled, with hyperglycemia, with long-term current use of insulin ([place])   Sepsis ([place])   Time spent:   Irwin Brakeman, MD [facility] 05/20/2019, 1:24 PM    LOS: 1 day  How to contact the [facility] Attending or Consulting provider 7A - 7P or covering provider during after hours [place], for this patient?  1. Check the care team in [facility] and look for a) attending/consulting TRH provider listed and b) the [facility] team listed 2. Log into [email or web address] and use [place]'s  universal password to access. If you do not have the password, please contact the hospital operator. 3. Locate the [facility] provider you are looking for under [facility] and page to a number that you can be directly reached. 4. If you still have difficulty reaching the provider, please page the [facility] (Director on Call) for the Hospitalists listed on amion for assistance.

## 2019-05-20 NOTE — Progress Notes (Signed)
SATURATION QUALIFICATIONS: (This note is used to comply with regulatory documentation for home oxygen)  Patient Saturations on Room Air at Rest = 93%  Patient Saturations on Room Air while Ambulating = 89% HR 99 bpm  Patient Saturations on 6 Liters of oxygen while Ambulating = 93%  Please briefly explain why patient needs home oxygen: Patient desaturates to below 90% O2 sats with activity with increased heart rate. Patient then began complaining of light-headedness and "floating" feeling with unsteadiness on feet and requiring PT assistance to remain standing. Patient required sitting rest break on 6L O2 prior to being able to ambulate the remaining 10 feet to the recliner with 6 L O2.   Floria Raveling. Hartnett-Rands, MS, PT Per [place] [phone] 05/20/2019

## 2019-05-20 NOTE — Progress Notes (Signed)
Called into patient's room for c/o "chest pain needing a nitroglycerin pill." When nurse entered room at 1040, found patient to be taking one of his own nitroglycerin pills. Patient rated pain 10/10 to chest and right arm numbness. Patient reports right arm numbness is usual with chest pain. VSS. At 1044, this nurse asked patient how he felt, he stated "I feel better, it doesn't hurt now." Dr Wynetta Emery made aware, will continue to monitor.

## 2019-05-21 DIAGNOSIS — J449 Chronic obstructive pulmonary disease, unspecified: Secondary | ICD-10-CM

## 2019-05-21 LAB — GLUCOSE, CAPILLARY
Glucose-Capillary: 148 mg/dL — ABNORMAL HIGH (ref 70–99)
Glucose-Capillary: 213 mg/dL — ABNORMAL HIGH (ref 70–99)

## 2019-05-21 MED ORDER — INSULIN GLARGINE 100 UNIT/ML ~~LOC~~ SOLN
90.0000 [IU] | Freq: Two times a day (BID) | SUBCUTANEOUS | Status: DC
  Administered 2019-05-21: 90 [IU] via SUBCUTANEOUS
  Filled 2019-05-21 (×3): qty 0.9

## 2019-05-21 MED ORDER — AMOXICILLIN-POT CLAVULANATE 875-125 MG PO TABS: 1.0000 | ORAL_TABLET | Freq: Two times a day (BID) | ORAL | 0 refills | Status: AC

## 2019-05-21 NOTE — Progress Notes (Signed)
Pharmacy Antibiotic Note  [name] is a 63 y.o. male admitted on 05/19/2019 with pneumonia.  Pharmacy has been consulted for vancomycin and cefepime dosing.  Plan: vancomycin 2gm iv x 1 Vancomycin 1250mg  IV every 24 hours.  Goal trough 15-20 mcg/mL. cefepime 2gm iv q12h   Height: 5\' 7"  (170.2 cm) Weight: 251 lb 12.8 oz (114.2 kg) IBW/kg (Calculated) : 66.1  Temp (24hrs), Avg:98.4 F (36.9 C), Min:98.3 F (36.8 C), Max:98.4 F (36.9 C)  Recent Labs  Lab 05/19/19 1422 05/19/19 1423 05/19/19 1613 05/20/19 0511  WBC  --  8.3  --  7.0  CREATININE  --  1.65*  --  1.32*  LATICACIDVEN 2.2*  --  1.4  --     Estimated Creatinine Clearance: 69.1 mL/min (A) (by C-G formula based on SCr of 1.32 mg/dL (H)).    Allergies  Allergen Reactions  . Oxycodone Itching and Nausea Only    Pt is able to tolerate if taken with benadryl    Antimicrobials this admission: 10/30 cefepime >>  10/30 vancomycin >>    Microbiology results: 10/30 BCx: NGTD 10/30 UCx: NGTD 10/30 MRSA PCR: Sent   Thank you for allowing pharmacy to be a part of this patient's care.  Donna Christen Hawley Michel 05/21/2019 8:09 AM

## 2019-05-21 NOTE — Discharge Instructions (Signed)
IMPORTANT INFORMATION: PAY CLOSE ATTENTION   PHYSICIAN DISCHARGE INSTRUCTIONS  Follow with Primary care provider  Camille Bal, PA-C  and other consultants as instructed by your Hospitalist Physician  [place] IF SYMPTOMS COME BACK, WORSEN OR NEW PROBLEM DEVELOPS   Please note: You were cared for by a hospitalist during your hospital stay. Every effort will be made to forward records to your primary care provider.  You can request that your primary care provider send for your hospital records if they have not received them.  Once you are discharged, your primary care physician will handle any further medical issues. Please note that NO REFILLS for any discharge medications will be authorized once you are discharged, as it is imperative that you return to your primary care physician (or establish a relationship with a primary care physician if you do not have one) for your post hospital discharge needs so that they can reassess your need for medications and monitor your lab values.  Please get a complete blood count and chemistry panel checked by your Primary MD at your next visit, and again as instructed by your Primary MD.  Get Medicines reviewed and adjusted: Please take all your medications with you for your next visit with your Primary MD  Laboratory/radiological data: Please request your Primary MD to go over all hospital tests and procedure/radiological results at the follow up, please ask your primary care provider to get all Hospital records sent to his/her office.  In some cases, they will be blood work, cultures and biopsy results pending at the time of your discharge. Please request that your primary care provider follow up on these results.  If you are diabetic, please bring your blood sugar readings with you to your follow up appointment with primary care.    Please call and make your follow up appointments as soon as possible.    Also  Note the following: If you experience worsening of your admission symptoms, develop shortness of breath, life threatening emergency, suicidal or homicidal thoughts you must seek medical attention immediately by calling 911 or calling your MD immediately  if symptoms less severe.  You must read complete instructions/literature along with all the possible adverse reactions/side effects for all the Medicines you take and that have been prescribed to you. Take any new Medicines after you have completely understood and accpet all the possible adverse reactions/side effects.   Do not drive when taking Pain medications or sleeping medications (Benzodiazepines)  Do not take more than prescribed Pain, Sleep and Anxiety Medications. It is not advisable to combine anxiety,sleep and pain medications without talking with your primary care practitioner  Special Instructions: If you have smoked or chewed Tobacco  in the last 2 yrs please stop smoking, stop any regular Alcohol  and or any Recreational drug use.  Wear Seat belts while driving.  Do not drive if taking any narcotic, mind altering or controlled substances or recreational drugs or alcohol.   Antibiotic Medicine, Adult  Antibiotic medicines treat infections caused by a type of germ called bacteria. They work by killing the bacteria that make you sick. When do I need to take antibiotics? You often need these medicines to treat bacterial infections, such as:  A urinary tract infection (UTI).  Strep throat.  Meningitis. This affects the spinal cord and brain.  A bad lung infection. You may start the medicines while your doctor waits for tests to come back. When the tests come  back, your doctor may change or stop your medicine. When are antibiotics not needed? You do not need these medicines for most common illnesses, such as:  A cold.  The flu.  A sore throat. Antibiotics are not always needed for all infections caused by bacteria. Do not  ask for these medicines, or take them, when they are not needed. What are the risks of taking antibiotics? Most antibiotics can cause an infection called Clostridioides difficile (C. diff). This causes watery poop (diarrhea). Let your doctor know right away if:  You have watery poop while taking an antibiotic.  You have watery poop after you stop taking an antibiotic. The illness can happen weeks after you stop the medicine. You also have a risk of getting an infection in the future that antibiotics cannot treat (antibiotic-resistant infection). This type of infection can be dangerous. What else should I know about taking antibiotics?   You need to take the entire prescription. ? Take the medicine for as long as told by your doctor. ? Do not stop taking it even if you start to feel better.  Try not to miss any doses. If you miss a dose, call your doctor.  Birth control pills may not work. If you take birth control pills: ? Keep on taking them. ? Use a second form of birth control, such as a condom. Do this for as long as told by your doctor.  Ask your doctor: ? How long to wait in between doses. ? If you should take the medicine with food. ? If there is anything you should stay away from while taking the antibiotic, such as: ? Food. ? Drinks. ? Medicines. ? If there are any side effects you should watch for.  Only take the medicines that your doctor told you to take. Do not take medicines that were given to someone else.  Drink a large glass of water with the medicine.  Ask the pharmacist for a tool to measure the medicine, such as: ? A syringe. ? A cup. ? A spoon.  Throw away any extra medicine. Contact a doctor if:  You get worse.  You have new joint pain or muscle aches after starting the medicine.  You have side effects from the medicine, such as: ? Stomach pain. ? Watery poop. ? Feeling sick to your stomach (nausea). Get help right away if:  You have signs of  a very bad allergic reaction. If this happens, stop taking the medicine right away. Signs may include: ? Hives. These are raised, itchy, red bumps on the skin. ? Skin rash. ? Trouble breathing. ? Wheezing. ? Swelling. ? Feeling dizzy. ? Throwing up (vomiting).  Your pee (urine) is dark, or is the color of blood.  Your skin turns yellow.  You bruise easily.  You bleed easily.  You have very bad watery poop and cramps in your belly.  You have a very bad headache. Summary  Antibiotics are often used to treat infections caused by bacteria.  Only take these medicines when needed.  Let your doctor know if you have watery poop while taking an antibiotic.  You need to take the entire prescription. This information is not intended to replace advice given to you by your health care provider. Make sure you discuss any questions you have with your health care provider. Document Released: 04/14/2008 Document Revised: 08/12/2018 Document Reviewed: 07/08/2016 Elsevier Patient Education  [place] Pneumonia, Adult Pneumonia is a type of lung infection  that causes swelling in the airways of the lungs. Mucus and fluid may also build up inside the airways. This may cause coughing and difficulty breathing. There are different types of pneumonia. One type can develop while a person is in a hospital. A different type is called community-acquired pneumonia. It develops in people who are not, and have not recently been, in the hospital or another type of health care facility. What are the causes? This condition may be caused by:  Viruses. This is the most common cause of pneumonia.  Bacteria. Community-acquired pneumonia is often caused by Streptococcus pneumoniae bacteria. These bacteria are often passed from one person to another by breathing in droplets from the cough or sneeze of an infected person.  Fungi. This is the least common cause of pneumonia. What  increases the risk? The following factors may make you more likely to develop this condition:  Having a chronic disease, such as chronic obstructive pulmonary disease (COPD), asthma, congestive heart failure, cystic fibrosis, diabetes, or kidney disease.  Having early-stage or late-stage HIV.  Having sickle cell disease.  Having had your spleen removed (splenectomy).  Having poor dental hygiene.  Having a medical condition that increases the risk of breathing in (aspirating) secretions from your own mouth and nose.  Having a weakened body defense system (immune system).  Being a smoker.  Traveling to areas where pneumonia-causing germs commonly exist.  Being around animal habitats or animals that have pneumonia-causing germs, including birds, bats, rabbits, cats, and farm animals. What are the signs or symptoms? Symptoms of this condition include:  A dry cough.  A wet (productive) cough.  Fever.  Sweating.  Chest pain, especially when breathing deeply or coughing.  Rapid breathing or difficulty breathing.  Shortness of breath.  Shaking chills.  Fatigue.  Muscle aches. How is this diagnosed? This condition may be diagnosed based on:  Your medical history.  A physical exam. You may also have tests, including:  Chest X-rays.  Tests of your blood oxygen level and other blood gases.  Tests on blood, mucus (sputum), fluid around your lungs (pleural fluid), and urine. If your pneumonia is severe, other tests may be done to find the exact cause of your illness. How is this treated? Treatment for this condition depends on many factors, such as the cause of your pneumonia, the medicines you take, and other medical conditions that you have. For most adults, treatment and recovery from pneumonia may occur at home. In some cases, treatment must happen in a hospital. Treatment may include:  Medicines that are given by mouth or through an IV, including: ? Antibiotic  medicines, if the pneumonia was caused by bacteria. ? Antiviral medicines, if the pneumonia was caused by a virus.  Being given extra oxygen.  Respiratory therapy. Although rare, treating severe pneumonia may include:  Using a machine to help you breathe (mechanical ventilation). This is done if you are not breathing well on your own and you cannot maintain a safe blood oxygen level.  Thoracentesis. This is a procedure to remove fluid from around one lung or both lungs to help you breathe better. Follow these instructions at home:  Medicines  Take over-the-counter and prescription medicines only as told by your health care provider. ? Only take cough medicine if you are losing sleep. Be aware that cough medicine can prevent your body's natural ability to remove mucus from your lungs.  If you were prescribed an antibiotic medicine, take it as told by your health care  provider. Do not stop taking the antibiotic even if you start to feel better. General instructions  Sleep in a semi-upright position at night. Try sleeping in a reclining chair, or place a few pillows under your head.  Rest as needed and get at least 8 hours of sleep each night.  Drink enough water to keep your urine pale yellow. This will help to thin out mucus secretions in your lungs.  Eat a healthy diet that includes plenty of vegetables, fruits, whole grains, low-fat dairy products, and lean protein.  Do not use any products that contain nicotine or tobacco, such as cigarettes, e-cigarettes, and chewing tobacco. If you need help quitting, ask your health care provider.  Keep all follow-up visits as told by your health care provider. This is important. How is this prevented? You can lower your risk of developing community-acquired pneumonia by:  Getting a pneumococcal vaccine. There are different types and schedules of pneumococcal vaccines. Ask your health care provider which option is best for you. Consider  getting the vaccine if: ? You are older than 64 years of age. ? You are older than 64 years of age and are undergoing cancer treatment, have chronic lung disease, or have other medical conditions that affect your immune system. Ask your health care provider if this applies to you.  Getting an influenza vaccine every year. Ask your health care provider which type of vaccine is best for you.  Getting regular checkups from your dentist.  Washing your hands often. If soap and water are not available, use hand sanitizer. Contact a health care provider if:  You have a fever.  You are losing sleep because you cannot control your cough with cough medicine. Get help right away if:  You have worsening shortness of breath.  You have increased chest pain.  Your sickness becomes worse, especially if you are an older adult or have a weakened immune system.  You cough up blood. Summary  Pneumonia is an infection of the lungs.  Community-acquired pneumonia develops in people who have not been in the hospital. It can be caused by bacteria, viruses, or fungi.  This condition may be treated with antibiotics or antiviral medicines.  Severe cases may require hospitalization, mechanical ventilation, and other procedures to drain fluid from the lungs. This information is not intended to replace advice given to you by your health care provider. Make sure you discuss any questions you have with your health care provider. Document Released: 07/06/2005 Document Revised: 03/03/2018 Document Reviewed: 03/03/2018 Elsevier Patient Education  [place].   Sepsis, Diagnosis, Adult Sepsis is a serious bodily reaction to an infection. The infection that triggers sepsis may be from a bacteria, virus, or fungus. Sepsis can result from an infection in any part of your body. Infections that commonly lead to sepsis include skin, lung, and urinary tract infections. Sepsis is a medical emergency that must be  treated right away in a hospital. In severe cases, it can lead to septic shock. Septic shock can weaken your heart and cause your blood pressure to drop. This can cause your central nervous system and your body's organs to stop working. What are the causes? This condition is caused by a severe reaction to infections from bacteria, viruses, or fungus. The germs that most often lead to sepsis include:  Escherichia coli (E. coli) bacteria.  Staphylococcus aureus (staph) bacteria.  Some types of Streptococcus bacteria. The most common infections affect these organs:  The lung (pneumonia).  The  kidneys or bladder (urinary tract infection).  The skin (cellulitis).  The bowel, gallbladder, or pancreas. What increases the risk? You are more likely to develop this condition if:  Your body's disease-fighting system (immune system) is weakened.  You are age 32 or older.  You are male.  You had surgery or you have been hospitalized.  You have these devices inserted into your body: ? A small, thin tube (catheter). ? IV line. ? Breathing tube. ? Drainage tube.  You are not getting enough nutrients from food (malnourished).  You have a long-term (chronic) disease, such as cancer, lung disease, kidney disease, or diabetes.  You are African American. What are the signs or symptoms? Symptoms of this condition may include:  Fever.  Chills or feeling very cold.  Confusion or anxiety.  Fatigue.  Muscle aches.  Shortness of breath.  Nausea and vomiting.  Urinating much less than usual.  Fast heart rate (tachycardia).  Rapid breathing (hyperventilation).  Changes in skin color. Your skin may look blotchy, pale, or blue.  Cool, clammy, or sweaty skin.  Skin rash. Other symptoms depend on the source of your infection. How is this diagnosed? This condition is diagnosed based on:  Your symptoms.  Your medical history.  A physical exam. Other tests may also be done to  find out the cause of the infection and how severe the sepsis is. These tests may include:  Blood tests.  Urine tests.  Swabs from other areas of your body that may have an infection. These samples may be tested (cultured) to find out what type of bacteria is causing the infection.  Chest X-ray to check for pneumonia. Other imaging tests, such as a CT scan, may also be done.  Lumbar puncture. This removes a small amount of the fluid that surrounds your brain and spinal cord. The fluid is then examined for infection. How is this treated? This condition must be treated in a hospital. Based on the cause of your infection, you may be given an antibiotic, antiviral, or antifungal medicine. You may also receive:  Fluids through an IV.  Oxygen and breathing assistance.  Medicines to increase your blood pressure.  Kidney dialysis. This process cleans your blood if your kidneys have failed.  Surgery to remove infected tissue.  Blood transfusion if needed.  Medicine to prevent blood clots.  Nutrients to correct imbalances in basic body function (metabolism). You may: ? Receive important salts and minerals (electrolytes) through an IV. ? Have your blood sugar level adjusted. Follow these instructions at home: Medicines   Take over-the-counter and prescription medicines only as told by your health care provider.  If you were prescribed an antibiotic, antiviral, or antifungal medicine, take it as told by your health care provider. Do not stop taking the medicine even if you start to feel better. General instructions  If you have a catheter or other indwelling device, ask to have it removed as soon as possible.  Keep all follow-up visits as told by your health care provider. This is important. Contact a health care provider if:  You do not feel like you are getting better or regaining strength.  You are having trouble coping with your recovery.  You frequently feel tired.  You  feel worse or do not seem to get better after surgery.  You think you may have an infection after surgery. Get help right away if:  You have any symptoms of sepsis.  You have difficulty breathing.  You have a rapid  or skipping heartbeat.  You become confused or disoriented.  You have a high fever.  Your skin becomes blotchy, pale, or blue.  You have an infection that is getting worse or not getting better. These symptoms may represent a serious problem that is an emergency. Do not wait to see if the symptoms will go away. Get medical help right away. Call your local emergency services (911 in the [place] Do not drive yourself to the hospital. Summary  Sepsis is a medical emergency that requires immediate treatment in a hospital.  This condition is caused by a severe reaction to infections from bacteria, viruses, or fungus.  Based on the cause of your infection, you may be given an antibiotic, antiviral, or antifungal medicine.  Treatment may also include IV fluids, breathing assistance, and kidney dialysis. This information is not intended to replace advice given to you by your health care provider. Make sure you discuss any questions you have with your health care provider. Document Released: 04/04/2003 Document Revised: 02/11/2018 Document Reviewed: 02/11/2018 Elsevier Patient Education  2020 [employer].

## 2019-05-21 NOTE — Progress Notes (Signed)
Discharge instructions given to patient. Pt verbalized understanding of f/u appts and new medications. When questioned about home o2, pt stated o2 in car. Brother went to car but o2 not in car. Explained to patient that hospital could not let him leave without o2. Pt satated that he wanted to leave against medical advice. Notified Md of pt intentions. MD suggested to call case management. Message left with weekend case mangaement. Went back to tell pt of message with case management.  Pt said he was going anyway. Ambualted without any assistance to elevator and off unit.

## 2019-05-21 NOTE — Discharge Summary (Signed)
Physician Discharge Summary  [name] [id number] DOB: 1956/06/02 DOA: 05/19/2019  PCP: Camille Bal, PA-C Pulmonologist: Dr. Chase Caller Cardiologist: Dr. Domenic Polite  Admit date: 05/19/2019 Discharge date: 05/21/2019  Admitted From:  Home  Disposition: Home   Recommendations for Outpatient Follow-up:  1. Follow up with PCP in 1 weeks 2. Follow up with pulmonologist in 2 weeks 3. Follow up with cardiologist as scheduled   Discharge Condition: STABLE   CODE STATUS: DNR    Brief Hospitalization Summary: Please see all hospital notes, images, labs for full details of the hospitalization. HPI: Dr. Waldron Labs: [name]  is a 63 y.o. male, with history of renal cell carcinoma status post right radical nephrectomy in 2015, interstitial lung disease with chronic respiratory failure on 5-7 L home O2, diastolic CHF, CAD with history of stenting in 2014, history of stroke, hyperlipidemia, uncontrolled type 2 diabetes mellitus, shunt is known DNR, patient presents to ED secondary to generalized weakness, patient reports dyspnea at baseline, reports cough, but dry, reports poor appetite, he still smoking 3 to 4 cigarettes/day, patient reports fall, few days ago, as well receive generalized weakness, nothing focal, ported injury, but denies any loss of consciousness, as well reports worsening left lower extremity edema. - in ED patient was noted to be hypotensive, but he did respond to fluid bolus, had elevated lactic acid, chest x-ray with evidence of pneumonia, started on broad-spectrum antibiotics, CT head was obtained regarding his fall with head trauma with no acute finding, venous Dopplers were obtained regarding worsening lower extremity extremity edema, no evidence for acute DVT, I was called to admit.  Brief Admission Hx: 63 y.o.male,with history of renal cell carcinoma status post right radical nephrectomy in 2015, interstitial lung disease with chronic respiratory failure on  5-7 L home O2, diastolic CHF, CAD with history of stenting in 2014, history of stroke, hyperlipidemia, uncontrolled type 2 diabetes mellitus,shunt is known DNR, patient presents to ED secondary to generalized weakness.  He was admitted with pneumonia.    MDM/Assessment & Plan:   1. Sepsis - RESOLVED.  2. Acute on chronic hypoxic respiratory failure - He remains on 6L [place] which is his baseline requirement, He says he feels better and wants to go home, will not remain in hospital for further treatments.  3. Chronic pain syndrome - continue home medications for pain management.  4. Type 2 DM - poorly controlled as evidenced by A1c >13%, resume home treatments and CBG monitoring. Follow up with PCP.   5. Tobacco abuse - nicotine patch ordered. 6. CAD - continue plavix, imdur, and statin, currently stable.  7. Chronic diastolic heart failure - resume home treatments, unchanged. Follow up with cardiologist.  8. BPH - continue flomax.   DVT prophylaxis: heparin, SCDs Code Status: DNR  Family Communication: patient updated bedside Disposition Plan: Home with close outpatient follow up  Discharge Diagnoses:  Active Problems:   Essential hypertension, benign   COLD (chronic obstructive lung disease) ([name])   ILD (interstitial lung disease) ([name])   Depression   Anxiety state   Carotid disease, bilateral ([name])   CKD (chronic kidney disease) stage 3, GFR 30-59 ml/min ([name])   Acute on chronic respiratory failure with hypoxia ([name])   Diabetes mellitus due to underlying condition, uncontrolled, with hyperglycemia, with long-term current use of insulin ([place])   Sepsis ([place])  Discharge Instructions:  Allergies as of 05/21/2019      Reactions   Oxycodone Itching, Nausea Only   Pt is able to tolerate if  taken with benadryl      Medication List    STOP taking these medications   doxycycline 100 MG tablet Commonly known as: VIBRA-TABS   nicotine 14 mg/24hr patch Commonly known as: NICODERM CQ  - dosed in mg/24 hours     TAKE these medications   ALPRAZolam 0.5 MG tablet Commonly known as: XANAX Take 1 tablet (0.5 mg total) by mouth 3 (three) times daily as needed for anxiety.   amoxicillin-clavulanate 875-125 MG tablet Commonly known as: AUGMENTIN Take 1 tablet by mouth 2 (two) times daily for 3 days.   atorvastatin 20 MG tablet Commonly known as: LIPITOR Take 20 mg by mouth at bedtime.   clopidogrel 75 MG tablet Commonly known as: PLAVIX TAKE ONE TABLET BY MOUTH DAILY   diltiazem 180 MG 24 hr capsule Commonly known as: Cardizem CD Take 1 capsule (180 mg total) by mouth daily.   docusate sodium 100 MG capsule Commonly known as: COLACE Take 200 mg by mouth at bedtime.   esomeprazole 40 MG capsule Commonly known as: NEXIUM Take 40 mg by mouth daily at 12 noon. Pt only take as needed   FLUoxetine 40 MG capsule Commonly known as: PROZAC Take 40 mg by mouth daily. Take with 10 mg capsule = 50 mg total   FLUoxetine 10 MG capsule Commonly known as: PROZAC Take 10 mg by mouth every morning. Take with 40 mg capsule = 50 mg total   gabapentin 300 MG capsule Commonly known as: NEURONTIN Take 900 mg by mouth 2 (two) times daily.   Glycopyrrolate-Formoterol 9-4.8 MCG/ACT Aero Commonly known as: Airline pilot Inhale 2 puffs into the lungs 2 (two) times daily.   insulin aspart 100 UNIT/ML injection Commonly known as: novoLOG Inject 30 Units into the skin 3 (three) times daily with meals.   isosorbide mononitrate 60 MG 24 hr tablet Commonly known as: IMDUR TAKE ONE TABLET BY MOUTH DAILY   Lantus SoloStar 100 UNIT/ML Solostar Pen Generic drug: Insulin Glargine Inject 115 Units into the skin 2 (two) times daily.   LORazepam 1 MG tablet Commonly known as: ATIVAN Take 0.5 mg by mouth at bedtime.   nitroGLYCERIN 0.4 MG SL tablet Commonly known as: NITROSTAT Place 1 tablet (0.4 mg total) under the tongue every 5 (five) minutes as needed for chest pain. PLACE  ONE TABLET UNDER THE TONGUE EVERY FIVE MINUTES AS NEEDED FOR CHEST PAIN. IF 3 TABLETS ARE NECESSARY CALL 911   oxybutynin 10 MG 24 hr tablet Commonly known as: DITROPAN-XL Take 10 mg by mouth daily.   oxyCODONE-acetaminophen 10-325 MG tablet Commonly known as: PERCOCET Take 1 tablet by mouth every 6 (six) hours as needed for pain.   potassium chloride SA 20 MEQ tablet Commonly known as: KLOR-CON Take 20 mEq by mouth 2 (two) times daily.   pregabalin 25 MG capsule Commonly known as: LYRICA Take 25 mg by mouth 2 (two) times daily.   PROBIOTIC PO Take 1 capsule by mouth daily.   tamsulosin 0.4 MG Caps capsule Commonly known as: FLOMAX Take 0.4 mg by mouth daily.   torsemide 20 MG tablet Commonly known as: DEMADEX TAKE THREE TABLETS BY MOUTH TWICE DAILY What changed: when to take this   Tradjenta 5 MG Tabs tablet Generic drug: linagliptin Take 5 mg by mouth daily.   zolpidem 5 MG tablet Commonly known as: AMBIEN Take 5 mg by mouth at bedtime as needed. for insomnia      Follow-up Information    Camille Bal,  PA-C. Schedule an appointment as soon as possible for a visit in 1 week(s).   Specialty: Physician Assistant Contact information: [address] [place] [address] [phone]        Satira Sark, MD. Schedule an appointment as soon as possible for a visit in 3 week(s).   Specialty: Cardiology Contact information: [place] [address] [phone]        Brand Males, MD. Schedule an appointment as soon as possible for a visit in 2 week(s).   Specialty: Pulmonary Disease Why: Hospital Follow Up Contact information: [place] [address] [phone]          Allergies  Allergen Reactions  . Oxycodone Itching and Nausea Only    Pt is able to tolerate if taken with benadryl   Allergies as of 05/21/2019      Reactions   Oxycodone Itching, Nausea Only   Pt is able to tolerate if  taken with benadryl      Medication List    STOP taking these medications   doxycycline 100 MG tablet Commonly known as: VIBRA-TABS   nicotine 14 mg/24hr patch Commonly known as: NICODERM CQ - dosed in mg/24 hours     TAKE these medications   ALPRAZolam 0.5 MG tablet Commonly known as: XANAX Take 1 tablet (0.5 mg total) by mouth 3 (three) times daily as needed for anxiety.   amoxicillin-clavulanate 875-125 MG tablet Commonly known as: AUGMENTIN Take 1 tablet by mouth 2 (two) times daily for 3 days.   atorvastatin 20 MG tablet Commonly known as: LIPITOR Take 20 mg by mouth at bedtime.   clopidogrel 75 MG tablet Commonly known as: PLAVIX TAKE ONE TABLET BY MOUTH DAILY   diltiazem 180 MG 24 hr capsule Commonly known as: Cardizem CD Take 1 capsule (180 mg total) by mouth daily.   docusate sodium 100 MG capsule Commonly known as: COLACE Take 200 mg by mouth at bedtime.   esomeprazole 40 MG capsule Commonly known as: NEXIUM Take 40 mg by mouth daily at 12 noon. Pt only take as needed   FLUoxetine 40 MG capsule Commonly known as: PROZAC Take 40 mg by mouth daily. Take with 10 mg capsule = 50 mg total   FLUoxetine 10 MG capsule Commonly known as: PROZAC Take 10 mg by mouth every morning. Take with 40 mg capsule = 50 mg total   gabapentin 300 MG capsule Commonly known as: NEURONTIN Take 900 mg by mouth 2 (two) times daily.   Glycopyrrolate-Formoterol 9-4.8 MCG/ACT Aero Commonly known as: Airline pilot Inhale 2 puffs into the lungs 2 (two) times daily.   insulin aspart 100 UNIT/ML injection Commonly known as: novoLOG Inject 30 Units into the skin 3 (three) times daily with meals.   isosorbide mononitrate 60 MG 24 hr tablet Commonly known as: IMDUR TAKE ONE TABLET BY MOUTH DAILY   Lantus SoloStar 100 UNIT/ML Solostar Pen Generic drug: Insulin Glargine Inject 115 Units into the skin 2 (two) times daily.   LORazepam 1 MG tablet Commonly known as:  ATIVAN Take 0.5 mg by mouth at bedtime.   nitroGLYCERIN 0.4 MG SL tablet Commonly known as: NITROSTAT Place 1 tablet (0.4 mg total) under the tongue every 5 (five) minutes as needed for chest pain. PLACE ONE TABLET UNDER THE TONGUE EVERY FIVE MINUTES AS NEEDED FOR CHEST PAIN. IF 3 TABLETS ARE NECESSARY CALL 911   oxybutynin 10 MG 24 hr tablet  Commonly known as: DITROPAN-XL Take 10 mg by mouth daily.   oxyCODONE-acetaminophen 10-325 MG tablet Commonly known as: PERCOCET Take 1 tablet by mouth every 6 (six) hours as needed for pain.   potassium chloride SA 20 MEQ tablet Commonly known as: KLOR-CON Take 20 mEq by mouth 2 (two) times daily.   pregabalin 25 MG capsule Commonly known as: LYRICA Take 25 mg by mouth 2 (two) times daily.   PROBIOTIC PO Take 1 capsule by mouth daily.   tamsulosin 0.4 MG Caps capsule Commonly known as: FLOMAX Take 0.4 mg by mouth daily.   torsemide 20 MG tablet Commonly known as: DEMADEX TAKE THREE TABLETS BY MOUTH TWICE DAILY What changed: when to take this   Tradjenta 5 MG Tabs tablet Generic drug: linagliptin Take 5 mg by mouth daily.   zolpidem 5 MG tablet Commonly known as: AMBIEN Take 5 mg by mouth at bedtime as needed. for insomnia      Procedures/Studies: Ct Head Wo Contrast  Result Date: 05/19/2019 CLINICAL DATA:  Trauma, lethargy, shortness of breath EXAM: CT HEAD WITHOUT CONTRAST TECHNIQUE: Contiguous axial images were obtained from the base of the skull through the vertex without intravenous contrast. COMPARISON:  05/27/2018 FINDINGS: Brain: Stable brain atrophy pattern and encephalomalacia from a remote right frontal MCA territory infarct in the right cerebral hemisphere. No acute intracranial hemorrhage, new mass lesion, definite new infarction, hydrocephalus, midline shift, or extra-axial fluid collection. No focal mass effect or edema. Cisterns are patent. No cerebellar abnormality. Vascular: No hyperdense vessel. Skull: Normal.  Negative for fracture or focal lesion. Sinuses/Orbits: No acute finding. Other: None. IMPRESSION: Stable remote right frontal MCA territory infarct and brain atrophy pattern. No interval change or acute process by noncontrast CT. Electronically Signed   By: Jerilynn Mages.  Shick M.D.   On: 05/19/2019 16:21   US Venous Img Lower Unilateral Left  Result Date: 05/19/2019 CLINICAL DATA:  Left lower extremity pain and edema for the past week following fall. History of kidney cancer. Evaluate for DVT. EXAM: LEFT LOWER EXTREMITY VENOUS DOPPLER ULTRASOUND TECHNIQUE: Gray-scale sonography with graded compression, as well as color Doppler and duplex ultrasound were performed to evaluate the lower extremity deep venous systems from the level of the common femoral vein and including the common femoral, femoral, profunda femoral, popliteal and calf veins including the posterior tibial, peroneal and gastrocnemius veins when visible. The superficial great saphenous vein was also interrogated. Spectral Doppler was utilized to evaluate flow at rest and with distal augmentation maneuvers in the common femoral, femoral and popliteal veins. COMPARISON:  None. FINDINGS: Contralateral Common Femoral Vein: Respiratory phasicity is normal and symmetric with the symptomatic side. No evidence of thrombus. Normal compressibility. Common Femoral Vein: No evidence of thrombus. Normal compressibility, respiratory phasicity and response to augmentation. Saphenofemoral Junction: No evidence of thrombus. Normal compressibility and flow on color Doppler imaging. Profunda Femoral Vein: No evidence of thrombus. Normal compressibility and flow on color Doppler imaging. Femoral Vein: No evidence of thrombus. Normal compressibility, respiratory phasicity and response to augmentation. Popliteal Vein: No evidence of thrombus. Normal compressibility, respiratory phasicity and response to augmentation. Calf Veins: No evidence of thrombus. Normal compressibility and  flow on color Doppler imaging. Superficial Great Saphenous Vein: No evidence of thrombus. Normal compressibility. Venous Reflux:  None. Other Findings: There is a moderate amount of subcutaneous edema at the level of the left lower leg and calf (representative image 31). No definable/drainable fluid collection. IMPRESSION: 1. No evidence of DVT within the left lower extremity.  2. Moderate amount of subcutaneous edema at the level of the left lower leg and calf without definable/drainable fluid collection. Electronically Signed   By: Sandi Mariscal M.D.   On: 05/19/2019 15:57   Dg Chest Port 1 View  Result Date: 05/19/2019 CLINICAL DATA:  Cough and chest pain.  History of lung carcinoma EXAM: PORTABLE CHEST 1 VIEW COMPARISON:  November 07, 2018 chest radiograph and February 20, 2019 chest radiograph FINDINGS: There is extensive scarring throughout the left mid and lower lung zone regions. There is slightly increased opacity in the left lower lobe compared to most recent study. The right lung is essentially clear. Heart is mildly enlarged with pulmonary vascularity normal. No adenopathy. There is aortic atherosclerosis. No bone lesions. IMPRESSION: Question a degree of pneumonia superimposed on scarring in the left mid lower lung zones. Right lung essentially clear. Heart mildly enlarged. No adenopathy evident. Aortic Atherosclerosis (ICD10-I70.0). Electronically Signed   By: Lowella Grip III M.D.   On: 05/19/2019 14:46      Subjective: Pt says he feels much better and wants to go home, doesn't want to stay in hospital for more treatment.  Says he will follow up with his own doctors outpatient.  Denies CP and SOB.    Discharge Exam: Vitals:   05/21/19 0744 05/21/19 0801  BP:    Pulse:    Resp:    Temp:    SpO2: 94% 94%   Vitals:   05/20/19 2118 05/21/19 0523 05/21/19 0744 05/21/19 0801  BP: (!) 149/89 104/82    Pulse: 95 70    Resp: 20 20    Temp: 98.4 F (36.9 C) 98.3 F (36.8 C)    TempSrc:       SpO2: 98% 92% 94% 94%  Weight:      Height:       General: Pt is alert, awake, not in acute distress Cardiovascular: RRR, S1/S2 +, no rubs, no gallops Respiratory: good air movement bilateral, no wheezing, no rhonchi Abdominal: Soft, NT, ND, bowel sounds + Extremities: no cyanosis   The results of significant diagnostics from this hospitalization (including imaging, microbiology, ancillary and laboratory) are listed below for reference.     Microbiology: Recent Results (from the past 240 hour(s))  Urine culture     Status: None   Collection Time: 05/19/19  2:10 PM   Specimen: In/Out Cath Urine  Result Value Ref Range Status   Specimen Description   Final    IN/OUT CATH URINE Performed at [facility], [address] [place], [place] [address]    Special Requests   Final    NONE Performed at [facility], [address] [place], [place] [address]    Culture   Final    NO GROWTH Performed at [facility], [place] [address] [place], [place] [address]    Report Status 05/20/2019 FINAL  Final  Blood Culture (routine x 2)     Status: None (Preliminary result)   Collection Time: 05/19/19  2:22 PM   Specimen: BLOOD RIGHT HAND  Result Value Ref Range Status   Specimen Description BLOOD RIGHT HAND  Final   Special Requests   Final    BOTTLES DRAWN AEROBIC AND ANAEROBIC Blood Culture adequate volume   Culture   Final    NO GROWTH < 24 HOURS Performed at [facility], [address] [place], [place] [address]    Report Status PENDING  Incomplete  Blood Culture (routine x 2)  Status: None (Preliminary result)   Collection Time: 05/19/19  2:25 PM   Specimen: BLOOD LEFT ARM  Result Value Ref Range Status   Specimen Description BLOOD LEFT ARM  Final   Special Requests   Final    BOTTLES DRAWN AEROBIC AND ANAEROBIC Blood Culture adequate volume   Culture   Final    NO GROWTH < 24 HOURS Performed at [facility], [address] [place], [place] [address]    Report  Status PENDING  Incomplete  SARS CORONAVIRUS 2 (TAT 6-24 HRS) Nasopharyngeal Nasopharyngeal Swab     Status: None   Collection Time: 05/19/19  5:59 PM   Specimen: Nasopharyngeal Swab  Result Value Ref Range Status   SARS Coronavirus 2 NEGATIVE NEGATIVE Final    Comment: (NOTE) SARS-CoV-2 target nucleic acids are NOT DETECTED. The SARS-CoV-2 RNA is generally detectable in upper and lower respiratory specimens during the acute phase of infection. Negative results do not preclude SARS-CoV-2 infection, do not rule out co-infections with other pathogens, and should not be used as the sole basis for treatment or other patient management decisions. Negative results must be combined with clinical observations, patient history, and epidemiological information. The expected result is Negative. Fact Sheet for Patients: SugarRoll.be Fact Sheet for Healthcare Providers: https://www.woods-mathews.com/ This test is not yet approved or cleared by the [place] FDA and  has been authorized for detection and/or diagnosis of SARS-CoV-2 by FDA under an Emergency Use Authorization (EUA). This EUA will remain  in effect (meaning this test can be used) for the duration of the COVID-19 declaration under Section 56 4(b)(1) of the Act, 21 U.S.C. section 360bbb-3(b)(1), unless the authorization is terminated or revoked sooner. Performed at [facility], [place] [address], [place], [place] [address]      Labs: BNP (last 3 results) Recent Labs    05/30/18 2347 11/07/18 1658 12/23/18 1311  BNP 71.2 72.5 [id number]   Basic Metabolic Panel: Recent Labs  Lab 05/19/19 1423 05/20/19 0511  NA 135 138  K 3.6 3.7  CL 100 107  CO2 25 22  GLUCOSE 286* 242*  BUN 19 15  CREATININE 1.65* 1.32*  CALCIUM 7.9* 7.7*   Liver Function Tests: Recent Labs  Lab 05/19/19 1423  AST 18  ALT 14  ALKPHOS 77  BILITOT 0.6  PROT 6.7  ALBUMIN 2.9*   No results for  input(s): LIPASE, AMYLASE in the last 168 hours. Recent Labs  Lab 05/19/19 1423  AMMONIA 25   CBC: Recent Labs  Lab 05/19/19 1423 05/20/19 0511  WBC 8.3 7.0  NEUTROABS 6.3  --   HGB 12.1* 12.0*  HCT 37.6* 38.6*  MCV 85.8 87.5  PLT 195 177   Cardiac Enzymes: No results for input(s): CKTOTAL, CKMB, CKMBINDEX, TROPONINI in the last 168 hours. BNP: Invalid input(s): POCBNP CBG: Recent Labs  Lab 05/20/19 0725 05/20/19 1123 05/20/19 1621 05/20/19 2119 05/21/19 0753  GLUCAP 222* 229* 223* 203* 148*   D-Dimer No results for input(s): DDIMER in the last 72 hours. Hgb A1c Recent Labs    05/19/19 1422  HGBA1C 13.4*   Lipid Profile No results for input(s): CHOL, HDL, LDLCALC, TRIG, CHOLHDL, LDLDIRECT in the last 72 hours. Thyroid function studies No results for input(s): TSH, T4TOTAL, T3FREE, THYROIDAB in the last 72 hours.  Invalid input(s): FREET3 Anemia work up No results for input(s): VITAMINB12, FOLATE, FERRITIN, TIBC, IRON, RETICCTPCT in the last 72 hours. Urinalysis    Component Value Date/Time   COLORURINE STRAW (A)  05/19/2019 1410   APPEARANCEUR CLEAR 05/19/2019 1410   LABSPEC 1.005 05/19/2019 1410   PHURINE 5.0 05/19/2019 1410   GLUCOSEU 50 (A) 05/19/2019 1410   HGBUR NEGATIVE 05/19/2019 1410   BILIRUBINUR NEGATIVE 05/19/2019 1410   KETONESUR NEGATIVE 05/19/2019 1410   PROTEINUR NEGATIVE 05/19/2019 1410   UROBILINOGEN 0.2 02/28/2015 1310   NITRITE NEGATIVE 05/19/2019 1410   LEUKOCYTESUR NEGATIVE 05/19/2019 1410   Sepsis Labs Invalid input(s): PROCALCITONIN,  WBC,  LACTICIDVEN Microbiology Recent Results (from the past 240 hour(s))  Urine culture     Status: None   Collection Time: 05/19/19  2:10 PM   Specimen: In/Out Cath Urine  Result Value Ref Range Status   Specimen Description   Final    IN/OUT CATH URINE Performed at [facility], [address], [place], [place] [address]    Special Requests   Final    NONE Performed at [facility], [address] [place], [place] [address]    Culture   Final    NO GROWTH Performed at [facility], [place] [address] [place], [place] [address]    Report Status 05/20/2019 FINAL  Final  Blood Culture (routine x 2)     Status: None (Preliminary result)   Collection Time: 05/19/19  2:22 PM   Specimen: BLOOD RIGHT HAND  Result Value Ref Range Status   Specimen Description BLOOD RIGHT HAND  Final   Special Requests   Final    BOTTLES DRAWN AEROBIC AND ANAEROBIC Blood Culture adequate volume   Culture   Final    NO GROWTH < 24 HOURS Performed at [facility], [address] [place], [place] [address]    Report Status PENDING  Incomplete  Blood Culture (routine x 2)     Status: None (Preliminary result)   Collection Time: 05/19/19  2:25 PM   Specimen: BLOOD LEFT ARM  Result Value Ref Range Status   Specimen Description BLOOD LEFT ARM  Final   Special Requests   Final    BOTTLES DRAWN AEROBIC AND ANAEROBIC Blood Culture adequate volume   Culture   Final    NO GROWTH < 24 HOURS Performed at [facility], [address] [place],  [address]    Report Status PENDING  Incomplete  SARS CORONAVIRUS 2 (TAT 6-24 HRS) Nasopharyngeal Nasopharyngeal Swab     Status: None   Collection Time: 05/19/19  5:59 PM   Specimen: Nasopharyngeal Swab  Result Value Ref Range Status   SARS Coronavirus 2 NEGATIVE NEGATIVE Final    Comment: (NOTE) SARS-CoV-2 target nucleic acids are NOT DETECTED. The SARS-CoV-2 RNA is generally detectable in upper and lower respiratory specimens during the acute phase of infection. Negative results do not preclude SARS-CoV-2 infection, do not rule out co-infections with other pathogens, and should not be used as the sole basis for treatment or other patient management decisions. Negative results must be combined with clinical observations, patient history, and epidemiological information. The expected result is Negative. Fact Sheet for  Patients: SugarRoll.be Fact Sheet for Healthcare Providers: https://www.woods-mathews.com/ This test is not yet approved or cleared by the [place] FDA and  has been authorized for detection and/or diagnosis of SARS-CoV-2 by FDA under an Emergency Use Authorization (EUA). This EUA will remain  in effect (meaning this test can be used) for the duration of the COVID-19 declaration under Section 56 4(b)(1) of the Act, 21 U.S.C. section 360bbb-3(b)(1), unless the authorization is terminated or revoked sooner. Performed at [facility],  [address] [place], [place] [address]    Time coordinating discharge: 33 minutes  SIGNED:  Irwin Brakeman, MD  [facility] 05/21/2019, 9:03 AM How to contact the [facility] Attending or Consulting provider [place] or covering provider during after hours [place], for this patient?  1. Check the care team in [facility] and look for a) attending/consulting [facility] provider listed and b) the [facility] team listed 2. Log into [email or web address] and use [place]'s universal password to access. If you do not have the password, please contact the hospital operator. 3. Locate the [facility] provider you are looking for under [facility] and page to a number that you can be directly reached. 4. If you still have difficulty reaching the provider, please page the [facility] (Director on Call) for the Hospitalists listed on amion for assistance.

## 2019-05-24 LAB — CULTURE, BLOOD (ROUTINE X 2)
Culture: NO GROWTH
Culture: NO GROWTH
Special Requests: ADEQUATE
Special Requests: ADEQUATE

## 2019-05-31 ENCOUNTER — Other Ambulatory Visit: Payer: Self-pay | Admitting: Cardiology

## 2019-06-07 ENCOUNTER — Ambulatory Visit: Payer: Medicaid Other | Admitting: Family Medicine

## 2019-06-07 ENCOUNTER — Other Ambulatory Visit: Payer: Self-pay

## 2019-06-07 ENCOUNTER — Encounter: Payer: Self-pay | Admitting: Cardiology

## 2019-06-07 ENCOUNTER — Telehealth: Payer: Medicaid Other | Admitting: Cardiology

## 2019-06-07 VITALS — BP 124/57 | HR 53 | Ht 69.0 in | Wt 236.0 lb

## 2019-06-07 DIAGNOSIS — I5032 Chronic diastolic (congestive) heart failure: Secondary | ICD-10-CM | POA: Diagnosis not present

## 2019-06-07 DIAGNOSIS — I25119 Atherosclerotic heart disease of native coronary artery with unspecified angina pectoris: Secondary | ICD-10-CM

## 2019-06-07 DIAGNOSIS — J449 Chronic obstructive pulmonary disease, unspecified: Secondary | ICD-10-CM | POA: Diagnosis not present

## 2019-06-07 NOTE — Patient Instructions (Addendum)

## 2019-06-07 NOTE — Progress Notes (Unsigned)
Virtual Visit via Telephone Note   This visit type was conducted due to national recommendations for restrictions regarding the COVID-19 Pandemic (e.g. social distancing) in an effort to limit this patient's exposure and mitigate transmission in our community.  Due to his co-morbid illnesses, this patient is at least at moderate risk for complications without adequate follow up.  This format is felt to be most appropriate for this patient at this time.  The patient did not have access to video technology/had technical difficulties with video requiring transitioning to audio format only (telephone).  All issues noted in this document were discussed and addressed.  No physical exam could be performed with this format.  Please refer to the patient's chart for his  consent to telehealth for [facility].   Date:  06/07/2019   ID:  [name], [name] 08-18-1955, MRN [id number]  Patient Location: Home Provider Location: Office  PCP:  Camille Bal, PA-C  Cardiologist:  [name], MD Electrophysiologist:  None   Evaluation Performed:  Follow-Up Visit  Chief Complaint: Follow-up coronary artery disease, hypertension  History of Present Illness:    [name] is a 63 y.o. male with recent hospital admission for pneumonia and sepsis, acute on chronic hypoxic respiratory failure.  Patient was hypotensive in the emergency room and did respond to fluid bolus.  Chest x-ray showed pneumonia.  Patient had experienced a fall prior to being admitted to the hospital for pneumonia.  CT of the head was performed which showed no acute findings.  History of CVA with left-sided weakness.  Patient was also experiencing lower extremity edema and had lower extremity venous Dopplers.  There was no evidence of acute DVT.   History of acute on chronic hypoxic respiratory failure on 6 L nasal cannula O2 at baseline.  At last video visit patient had been complaining that his breathlessness had been  worse over the last few months.  He sees pulmonology for chronic hypoxic respiratory failure.  66-pack-year of smoking and continues to smoke.  History of coronary artery disease with previous stenting in 2014.  History of chronic diastolic heart failure on torsemide 20 mg 60 mg twice daily.  History of COPD and interstitial lung disease with fibrosis.  He is a long-term smoker.  A nicotine patch was ordered during hospitalization.   Medications reviewed include; diltiazem 180 mg daily, Plavix 75 mg daily, Imdur 60 mg daily, nitroglycerin sublingual 0.4 mg as needed.  The patient does not have symptoms concerning for COVID-19 infection (fever, chills, cough, or new shortness of breath).    Past Medical History:  Diagnosis Date  . Anxiety   . Arthritis   . Cholecystitis, acute 12/20/2013   Lap chole 6/5  . Cocaine abuse ([place])   . COPD (chronic obstructive pulmonary disease) ([place])   . Coronary atherosclerosis of native coronary artery    a. 03/09/2013 Cath/PCI: LM nl, LAD: 50p, 18m (2.5x16 promus DES), LCX nl, OM1 min irregs, LPL/LPDA diff dzs, RCA nondom, mod diff dzs, EF 55%.  . Depression   . Essential hypertension, benign   . GERD (gastroesophageal reflux disease)   . Headache   . Hepatitis Late 1970s  . History of nephrolithiasis   . History of pneumonia   . History of stroke    Right MCA distribution, residual left-sided weakness  . Hyperlipidemia   . Noncompliance   . Renal cell carcinoma [facility])    Status post radical right nephrectomy August 2015  . Sleep apnea  On CPAP, 4L O2 no cpap at home yet  . Type 2 diabetes mellitus ([place]) 2011   Past Surgical History:  Procedure Laterality Date  . APPENDECTOMY  1970's  . CHOLECYSTECTOMY N/A 12/22/2013   Procedure: LAPAROSCOPIC CHOLECYSTECTOMY ;  Surgeon: [name], MD;  Location: [place];  Service: General;  Laterality: N/A;  . ENDARTERECTOMY Right 10/08/2014   Procedure: Right ENDARTERECTOMY CAROTID;  Surgeon: [name], MD;  Location: [place];  Service: Vascular;  Laterality: Right;  . LAPAROSCOPIC LYSIS OF ADHESIONS  02/21/2014   Procedure: LAPAROSCOPIC LYSIS OF ADHESIONS EXTINSIVE;  Surgeon: [name], MD;  [facility]: [facility];  Service: Urology;;  . LEFT HEART CATHETERIZATION WITH CORONARY ANGIOGRAM N/A 06/02/2012   Procedure: LEFT HEART CATHETERIZATION WITH CORONARY ANGIOGRAM;  Surgeon: [name], MD;  Location: [facility] CATH LAB;  Service: Cardiovascular;  Laterality: N/A;  . LEFT HEART CATHETERIZATION WITH CORONARY ANGIOGRAM N/A 03/09/2013   Procedure: LEFT HEART CATHETERIZATION WITH CORONARY ANGIOGRAM;  Surgeon: [name], MD;  Location: [facility] CATH LAB;  Service: Cardiovascular;  Laterality: N/A;  . LEFT HEART CATHETERIZATION WITH CORONARY ANGIOGRAM N/A 12/20/2013   Procedure: LEFT HEART CATHETERIZATION WITH CORONARY ANGIOGRAM;  Surgeon: [name], MD;  Location: [facility] CATH LAB;  Service: Cardiovascular;  Laterality: N/A;  . LUNG BIOPSY Left 05/18/2014   Procedure: LUNG BIOPSY left upper lobe & left lower lobe;  Surgeon: [name], MD;  Location: [place];  Service: Thoracic;  Laterality: Left;  . PATCH ANGIOPLASTY Right 10/08/2014   Procedure: PATCH ANGIOPLASTY Right Carotid;  Surgeon: [name], MD;  Location: [place];  Service: Vascular;  Laterality: Right;  . ROBOT ASSISTED LAPAROSCOPIC NEPHRECTOMY Right 02/21/2014   Procedure: ROBOTIC ASSISTED LAPAROSCOPIC RIGHT NEPHRECTOMY ;  Surgeon: [name], MD;  [facility];  Service: Urology;  Laterality: Right;  [name] VIDEO ASSISTED THORACOSCOPY Left 05/18/2014   Procedure: LEFT VIDEO ASSISTED THORACOSCOPY;  Surgeon: [name], MD;  Location: [place];  Service: Thoracic;  Laterality: Left;  [name] VIDEO BRONCHOSCOPY  05/18/2014   Procedure: VIDEO BRONCHOSCOPY;  Surgeon: [name], MD;  Location: [facility] OR;  Service: Thoracic;;     No outpatient medications have been marked as taking for the 06/07/19 encounter  (Appointment) with Satira Sark, MD.     Allergies:   Oxycodone   Social History   Tobacco Use  . Smoking status: Current Some Day Smoker    Packs/day: 1.00    Years: 41.00    Pack years: 41.00    Types: Cigarettes    Start date: 05/03/1972  . Smokeless tobacco: Never Used  . Tobacco comment: 2cigs per day as of 08/01/2018  Substance Use Topics  . Alcohol use: No    Alcohol/week: 0.0 standard drinks  . Drug use: No    Types: Cocaine    Comment: last used cocaine 11/2013. but is cocaine + this admit July 2015     Family Hx: The patient's family history includes CAD in his brother and father; Cancer in his maternal grandfather, maternal grandmother, and mother; Diabetes in an other family member; Hypertension in an other family member; Lung cancer in his father; Stroke in an other family member.  ROS:   Please see the history of present illness.    All other systems reviewed and are negative.   Prior CV studies:   The following studies were reviewed today:  Cardiac catheterization 12/20/2013: Hemodynamic Findings: Central aortic pressure: 120/62 Left ventricular pressure: 127/7/15  Angiographic Findings:  Left main: Short segment. No obstructive disease.   Left Anterior Descending Artery: Moderate caliber vessel that courses to the apex. There is 40% proximal stenosis. The mid stented segment is patent without restenosis. The distal vessel has diffuse non-obstructive plaque. The diagonal branches are small in caliber with no obstructive disease.   Circumflex Artery: Large dominant system with large bifurcating first obtuse marginal branch and three small caliber posterolateral branches. The OM branch has diffuse 50% proximal stenosis, no focally obstructive lesions. The three small caliber posterolateral branches all have diffuse moderate to severe stenosis that is unchanged from last cath.   Right Coronary Artery: Small non-dominant vessel with 40% mid vessel  stenosis.   Left Ventricular Angiogram: LVEF=65%  Impression: 1. Double vessel CAD with patent stent mid LAD 2. Moderate non-obstructive disease in the Circumflex and RCA 3. Normal LV systolic function  Echocardiogram 07/05/2015: Study Conclusions  - Left ventricle: The cavity size was normal. Wall thickness was normal. Systolic function was normal. The estimated ejection fraction was in the range of 60% to 65%. Doppler parameters are consistent with abnormal left ventricular relaxation (grade 1 diastolic dysfunction).  Labs/Other Tests and Data Reviewed:    EKG:  {EKG/Telemetry Strips Reviewed:[phone]}  Recent Labs: 12/23/2018: B Natriuretic Peptide 44.9 05/19/2019: ALT 14 05/20/2019: BUN 15; Creatinine, Ser 1.32; Hemoglobin 12.0; Platelets 177; Potassium 3.7; Sodium 138   Recent Lipid Panel Lab Results  Component Value Date/Time   CHOL 128 12/19/2013 02:20 AM   TRIG 152 (H) 12/19/2013 02:20 AM   HDL 31 (L) 12/19/2013 02:20 AM   CHOLHDL 4.1 12/19/2013 02:20 AM   LDLCALC 67 12/19/2013 02:20 AM    Wt Readings from Last 3 Encounters:  05/19/19 251 lb 12.8 oz (114.2 kg)  05/03/19 239 lb 3.2 oz (108.5 kg)  03/08/19 233 lb (105.7 kg)     Objective:    Vital Signs:  There were no vitals taken for this visit.   {HeartCare Virtual Exam (Optional):[phone]::"VITAL SIGNS:  reviewed"}  ASSESSMENT & PLAN:    1. ***  COVID-19 Education: The signs and symptoms of COVID-19 were discussed with the patient and how to seek care for testing (follow up with PCP or arrange E-visit).  ***The importance of social distancing was discussed today.  Time:   Today, I have spent *** minutes with the patient with telehealth technology discussing the above problems.     Medication Adjustments/Labs and Tests Ordered: Current medicines are reviewed at length with the patient today.  Concerns regarding medicines are outlined above.   Tests Ordered: No orders of the defined  types were placed in this encounter.   Medication Changes: No orders of the defined types were placed in this encounter.   Follow Up:  {F/U [phone]} {follow up:15908}  Signed, Verta Ellen, NP  06/07/2019 1:55 PM    [facility]

## 2019-06-07 NOTE — Progress Notes (Signed)
Cardiology Office Note  Date: 06/07/2019   ID: Kitt, Kerkhoff 08/21/1955, MRN [id number]  PCP:  Camille Bal, PA-C  Cardiologist:  [name], MD Electrophysiologist:  None   Chief Complaint  Patient presents with   Follow-up    Status post discharge from recent hospital stay for pneumonia, sepsis, acute on chronic hypoxic respiratory failure    History of Present Illness: [name] is a 63 y.o. male here for follow-up status post recent discharge from hospital visit for pneumonia, sepsis, acute on chronic hypoxic respiratory failure.  Patient was admitted to hospital for recent complaints of weakness, worsening lower extremity edema, dry cough, poor appetite.  Patient was hypotensive on arrival but received a fluid bolus.  Chest x-ray showed evidence of pneumonia.  He was started on broad-spectrum antibiotics.  He had a recent fall and CT was obtained secondary to fall. There were no acute findings.  Patient had a lower extremity venous Doppler study for lower extremity edema.  Patient was ruled out for acute DVT.  Patient states he initially weighed around 256 pounds on hospital admission and subsequently lost weight to around 235 pounds at discharge.  He states his weight is around his baseline weight.  Patient has chronic interstitial lung disease and COPD.  Significant history of smoking.  He sees pulmonology.  He states he was recently referred to a cancer specialist stating he was told he had lung cancer.  He will be seeing cancer specialist in January.  He is current on continuous oxygen at 6 to 8 L/min  He complains of chest pain on exertion with increased dyspnea.  States sometimes it is accompanied by nausea.  States he has very little exercise tolerance and he is tired most of the time.  He is not very active on a daily basis.  There were no medication changes at hospital discharge.  Current medications include; Plavix 75 mg daily, diltiazem CD 180  mg daily, Imdur 60 mg daily, torsemide 60 mg p.o. twice daily, potassium 20 mEq p.o. twice daily, nitroglycerin 0.4 mg sublingual as needed for chest pain.  Past Medical History:  Diagnosis Date   Anxiety    Arthritis    Cholecystitis, acute 12/20/2013   Lap chole 6/5   Cocaine abuse ([name])    COPD (chronic obstructive pulmonary disease) ([name])    Coronary atherosclerosis of native coronary artery    a. 03/09/2013 Cath/PCI: LM nl, LAD: 50p, 64m (2.5x16 promus DES), LCX nl, OM1 min irregs, LPL/LPDA diff dzs, RCA nondom, mod diff dzs, EF 55%.   Depression    Essential hypertension, benign    GERD (gastroesophageal reflux disease)    Headache    Hepatitis Late 1970s   History of nephrolithiasis    History of pneumonia    History of stroke    Right MCA distribution, residual left-sided weakness   Hyperlipidemia    Noncompliance    Renal cell carcinoma ([place])    Status post radical right nephrectomy August 2015   Sleep apnea    On CPAP, 4L O2 no cpap at home yet   Type 2 diabetes mellitus ([place]) 2011    Past Surgical History:  Procedure Laterality Date   APPENDECTOMY  1970's   CHOLECYSTECTOMY N/A 12/22/2013   Procedure: LAPAROSCOPIC CHOLECYSTECTOMY ;  Surgeon: [name], MD;  Location: [place];  Service: General;  Laterality: N/A;   ENDARTERECTOMY Right 10/08/2014   Procedure: Right ENDARTERECTOMY CAROTID;  Surgeon: [name], MD;  Location: [place] OR;  Service: Vascular;  Laterality: Right;   LAPAROSCOPIC LYSIS OF ADHESIONS  02/21/2014   Procedure: LAPAROSCOPIC LYSIS OF ADHESIONS EXTINSIVE;  Surgeon: [name], MD;  [facility];  Service: Urology;;   LEFT HEART CATHETERIZATION WITH CORONARY ANGIOGRAM N/A 06/02/2012   Procedure: LEFT HEART CATHETERIZATION WITH CORONARY ANGIOGRAM;  Surgeon: [name], MD;  Location: [facility] [place] CATH LAB;  Service: Cardiovascular;  Laterality: N/A;   LEFT HEART CATHETERIZATION WITH CORONARY ANGIOGRAM N/A 03/09/2013    Procedure: LEFT HEART CATHETERIZATION WITH CORONARY ANGIOGRAM;  Surgeon: [name], MD;  Location: [facility] CATH LAB;  Service: Cardiovascular;  Laterality: N/A;   LEFT HEART CATHETERIZATION WITH CORONARY ANGIOGRAM N/A 12/20/2013   Procedure: LEFT HEART CATHETERIZATION WITH CORONARY ANGIOGRAM;  Surgeon: [name], MD;  Location: [facility] CATH LAB;  Service: Cardiovascular;  Laterality: N/A;   LUNG BIOPSY Left 05/18/2014   Procedure: LUNG BIOPSY left upper lobe & left lower lobe;  Surgeon: [name], MD;  Location: [place];  Service: Thoracic;  Laterality: Left;   PATCH ANGIOPLASTY Right 10/08/2014   Procedure: PATCH ANGIOPLASTY Right Carotid;  Surgeon: [name], MD;  Location: [place];  Service: Vascular;  Laterality: Right;   ROBOT ASSISTED LAPAROSCOPIC NEPHRECTOMY Right 02/21/2014   Procedure: ROBOTIC ASSISTED LAPAROSCOPIC RIGHT NEPHRECTOMY ;  Surgeon: [name], MD;  [facility];  Service: Urology;  Laterality: Right;   VIDEO ASSISTED THORACOSCOPY Left 05/18/2014   Procedure: LEFT VIDEO ASSISTED THORACOSCOPY;  Surgeon: [name], MD;  Location: [place];  Service: Thoracic;  Laterality: Left;   VIDEO BRONCHOSCOPY  05/18/2014   Procedure: VIDEO BRONCHOSCOPY;  Surgeon: [name], MD;  Location: [place];  Service: Thoracic;;    Current Outpatient Medications  Medication Sig Dispense Refill   ALPRAZolam (XANAX) 0.5 MG tablet Take 1 tablet (0.5 mg total) by mouth 3 (three) times daily as needed for anxiety.  0   atorvastatin (LIPITOR) 20 MG tablet Take 20 mg by mouth at bedtime.  1   clopidogrel (PLAVIX) 75 MG tablet TAKE ONE TABLET BY MOUTH DAILY 90 tablet 3   diltiazem (CARDIZEM CD) 180 MG 24 hr capsule Take 1 capsule (180 mg total) by mouth daily. 90 capsule 2   docusate sodium (COLACE) 100 MG capsule Take 200 mg by mouth at bedtime.      esomeprazole (NEXIUM) 40 MG capsule Take 40 mg by mouth daily at 12 noon. Pt only take as needed      FLUoxetine (PROZAC) 10 MG capsule Take 10 mg by mouth every morning. Take with 40 mg capsule = 50 mg total  0   FLUoxetine (PROZAC) 40 MG capsule Take 40 mg by mouth daily. Take with 10 mg capsule = 50 mg total     gabapentin (NEURONTIN) 300 MG capsule Take 900 mg by mouth 2 (two) times daily.      Glycopyrrolate-Formoterol (BEVESPI AEROSPHERE) 9-4.8 MCG/ACT AERO Inhale 2 puffs into the lungs 2 (two) times daily. 1 Inhaler 11   insulin aspart (NOVOLOG) 100 UNIT/ML injection Inject 30 Units into the skin 3 (three) times daily with meals.      isosorbide mononitrate (IMDUR) 60 MG 24 hr tablet TAKE ONE TABLET BY MOUTH DAILY 90 tablet 1   LANTUS SOLOSTAR 100 UNIT/ML Solostar Pen Inject 115 Units into the skin 2 (two) times daily.   4   linagliptin (TRADJENTA) 5 MG TABS tablet Take 5 mg by mouth daily.     LORazepam (ATIVAN) 1 MG  tablet Take 0.5 mg by mouth at bedtime.      nitroGLYCERIN (NITROSTAT) 0.4 MG SL tablet Place 1 tablet (0.4 mg total) under the tongue every 5 (five) minutes as needed for chest pain. IF 3 TABLETS ARE NECESSARY CALL 911 25 tablet 3   oxybutynin (DITROPAN-XL) 10 MG 24 hr tablet Take 10 mg by mouth daily.  11   oxyCODONE-acetaminophen (PERCOCET) 10-325 MG tablet Take 1 tablet by mouth every 6 (six) hours as needed for pain. 30 tablet 0   potassium chloride SA (K-DUR,KLOR-CON) 20 MEQ tablet Take 20 mEq by mouth 2 (two) times daily.      pregabalin (LYRICA) 25 MG capsule Take 25 mg by mouth 2 (two) times daily.     Probiotic Product (PROBIOTIC PO) Take 1 capsule by mouth daily.     tamsulosin (FLOMAX) 0.4 MG CAPS capsule Take 0.4 mg by mouth daily.      torsemide (DEMADEX) 20 MG tablet Take 60 mg by mouth 2 (two) times daily.     zolpidem (AMBIEN) 5 MG tablet Take 5 mg by mouth at bedtime as needed. for insomnia     No current facility-administered medications for this visit.    Allergies:  Oxycodone   Social History: The patient  reports that he has been  smoking cigarettes. He started smoking about 47 years ago. He has a 41.00 pack-year smoking history. He has never used smokeless tobacco. He reports that he does not drink alcohol or use drugs.   Family History: The patient's family history includes CAD in his brother and father; Cancer in his maternal grandfather, maternal grandmother, and mother; Diabetes in an other family member; Hypertension in an other family member; Lung cancer in his father; Stroke in an other family member.   ROS:  Please see the history of present illness. Otherwise, complete review of systems is positive for chest pain,/chest pressure/tightness on exertion with accompanying dyspnea.  All other systems are reviewed and negative.   Physical Exam: VS:  BP (!) 124/57    Pulse (!) 53    Ht 5\' 9"  (1.753 m)    Wt 236 lb (107 kg)    SpO2 94% Comment: 6 L/min 24/7   BMI 34.85 kg/m , BMI Body mass index is 34.85 kg/m.  Wt Readings from Last 3 Encounters:  06/07/19 236 lb (107 kg)  05/19/19 251 lb 12.8 oz (114.2 kg)  05/03/19 239 lb 3.2 oz (108.5 kg)    General: Patient appears comfortable at rest. Neck: Supple, no elevated JVP, evidence of carotid endarterectomy on right side with bruit, no thyromegaly. Lungs: Inspiratory and expiratory wheezing, on continuous oxygen at 6 to 8 L/min nonlabored breathing at rest. Cardiac: Regular rate and rhythm, mild audible systolic murmur heard best at right upper sternal border., no pericardial rub. Abdomen: Appears distended slightly tight with asymmetry more prominent on the left.  Bowel sounds present, no guarding or rebound. Extremities: No pitting edema, distal pulses 2+. Skin: Warm and dry. Neuropsychiatric: Alert and oriented x3, affect grossly appropriate.  ECG:    Recent EKG October 20th 2020 showed sinus rhythm with super ventricular bigeminy, borderline prolonged PR interval, old inferior infarct, Q's and/or ST abnormalities in lateral leads  Recent Labwork: 12/23/2018: B  Natriuretic Peptide 44.9 05/19/2019: ALT 14; AST 18 05/20/2019: BUN 15; Creatinine, Ser 1.32; Hemoglobin 12.0; Platelets 177; Potassium 3.7; Sodium 138     Component Value Date/Time   CHOL 128 12/19/2013 0220   TRIG 152 (H) 12/19/2013 0220  HDL 31 (L) 12/19/2013 0220   CHOLHDL 4.1 12/19/2013 0220   VLDL 30 12/19/2013 0220   LDLCALC 67 12/19/2013 0220    Other Studies Reviewed Today: Echocardiogram 07/05/2015 Study Conclusions  - Left ventricle: The cavity size was normal. Wall thickness was   normal. Systolic function was normal. The estimated ejection   fraction was in the range of 60% to 65%. Doppler parameters are   consistent with abnormal left ventricular relaxation (grade 1   diastolic dysfunction).  Transthoracic echocardiography.  M-mode, complete 2D, spectral Doppler, and color Doppler.  Birthdate:  Patient birthdate: 1956-04-05.  Age:  Patient is 63 yr old.  Sex:  Gender: male. BMI: 38.4 kg/m^2.  Blood pressure:     128/80  Patient status: Inpatient.  Study date:  Study date: 07/05/2015. Study time: 02:25 PM.  Location:  Bedside.  Lower venous study May 09, 2019  IMPRESSION: 1. No evidence of DVT within the left lower extremity. 2. Moderate amount of subcutaneous edema at the level of the left lower leg and calf without definable/drainable fluid collection.   Assessment and Plan:  1.  CAD continues to complain of chest pain/tightness/pressure on exertion.  States he occasionally takes sublingual nitroglycerin in addition to Imdur which helps relieve chest pain/pressure/tightness. Continue medical therapy. Has stable angina symptoms overall in the setting of other chronic comorbidities.   2. COPD. Patient states he has been told by his pulmonary specialist that he has lung cancer and is being referred to a cancer specialist.  States he has an appointment to see specialist in January.  End-stage interstitial lung disease with chronic hypoxic respiratory  failure.    3.  Chronic diastolic heart failure.  Recent hospital admission with significantly increased weight gain.  Patient was diuresed.  Patient states he lost from an admission weight of 256 pounds down to 235 pounds recently He remains on Demadex with potassium supplements.    Medication Adjustments/Labs and Tests Ordered: Current medicines are reviewed at length with the patient today.  Concerns regarding medicines are outlined above.  No changes in medication therapy.  Patient Instructions  Medication Instructions:    Your physician recommends that you continue on your current medications as directed. Please refer to the Current Medication list given to you today.  Labwork:  NONE  Testing/Procedures:  NONE  Follow-Up:  Your physician recommends that you schedule a follow-up appointment in: 6 months. You will receive a reminder letter in the mail in about 4 months reminding you to call and schedule your appointment. If you don't receive this letter, please contact our office.  Any Other Special Instructions Will Be Listed Below (If Applicable).  If you need a refill on your cardiac medications before your next appointment, please call your pharmacy.        Signed, Levell July, NP 06/07/2019 3:17 PM    [facility] at [place], [place], [place] [address] Phone: [phone]; Fax: [phone]

## 2019-06-08 ENCOUNTER — Ambulatory Visit: Payer: Medicaid Other | Admitting: Internal Medicine

## 2019-06-08 ENCOUNTER — Encounter: Payer: Self-pay | Admitting: Internal Medicine

## 2019-06-08 VITALS — BP 126/72 | HR 103 | Ht 69.0 in | Wt 235.6 lb

## 2019-06-08 DIAGNOSIS — J9611 Chronic respiratory failure with hypoxia: Secondary | ICD-10-CM | POA: Diagnosis not present

## 2019-06-08 DIAGNOSIS — J438 Other emphysema: Secondary | ICD-10-CM

## 2019-06-08 DIAGNOSIS — J849 Interstitial pulmonary disease, unspecified: Secondary | ICD-10-CM | POA: Diagnosis not present

## 2019-06-08 DIAGNOSIS — Z720 Tobacco use: Secondary | ICD-10-CM

## 2019-06-08 NOTE — Progress Notes (Signed)
OV 04/10/2015  Chief Complaint  Patient presents with   Follow-up    Pt states his breathing has worsened since last OV. Pt was admitted to [facility] for CP and syncope. Pt c/o increase in DOE, prod cough with dark brown mucus, chest disfomfort when active.    follow-up interstitial lung disease and chronic respiratory failure secondary to Desquamative nterstitial pneumonitis (DIP)and burnt out Langerhans' cell histiocytosis   Last seen 2 months ago" July 64 2016. At that point in time spirometry showed a significant decline in forced vital capacity from 4 L at baseline to 2.6 L. He was using 4 L of oxygen at rest and 6 L with exertion at home. However we could not convincingly document hypoxemia with exertion on room air in the office at that time. Therefore to this and I ordered pulmonary function tests and CT scan of the chest. He did have CT scan of the chest in August 2016. There is no report on progression compared to February 2016 but I suspect the findings are similar. While he underwent pulmonary function test he had cough syncope and was admitted to the hospital. Cardiology notes reviewed and the final diagnosis was cough syncope. He he now presents for follow-up. He continues to be miserable with class IV dyspnea. He uses 4-6 liters of oxygen because he subjectively feels the need to do that. He does not monitor his pulse ox at home. He says that he continues to have cough syncope at home and has fallen several times. He has now moved to live with his 23 year old mom. He is socially isolated otherwise. He feels like he needs an extra layer of help at home. There are no other issues  Pulse oximetry on room air at rest and walking desaturation test 185 feet 3 laps on room air: RA rest after 15 min off o2, pulse o98% and lowest pulse ox with forehead probe was 90%. He did get dizzy with coughing   OV 06/05/15 63 year old man with tobacco abuse and history of interstitial lung disease,  desquamative interstitial pneumonitis and burnt out Langerhans' cell histiocytosis, hypoxemic respiratory failure; untreated OSA unable to tolerate CPAP. He probably also has some degree of superimposed COPD.  He presents today with two weeks of increased dyspnea, . He is having paroxysms of cough, can lead to pre-syncope / syncope. Last was 2 days ago. He has LE edema, probable more than his baseline. He is using duoneb bid, symbicort bid, albuterol prn - averages once a day. He complains of dry mouth. He took extra lasix after last OV here with TP, helped his edema but not his dyspnea.     OV 07/05/2015  Chief Complaint  Patient presents with   Follow-up    Pt states his SOB has worsened since last OV. Pt also c/o increase in nonprod cough, pt states he frequently coughs when eating or drinking. Pt c/o right lateral chest pain when coughing. Pt denies f/c/s.   Admit    63yo male smoker with  ILD(DIP)  on Oxygen .  Polysubstance abuse hx.   08/09/2015 [facility] follow up  Patient returns for a post hospital follow-up. Patient was recently admitted with acute on chronic respiratory failure with underlying interstitial lung disease for  acute on chronic diastolic heart failure. He improved with diuresis .  He says he is feeling improved. Has decreased shortness of breath. Unfortunately continues to smoke. We discussed smoking cessation. He denies any hemoptysis, chest pain, orthopnea,  PND, or increased leg swelling.    OV 10/14/2015  Chief Complaint  Patient presents with   Follow-up    Still having SOB, coughing and mild wheezing. Pt states that he feels like fluid is coming back especially on left side.    Follow-up interstitial lung disease and chronic hypoxemic respiratory failure associated with volume overload and diastolic dysfunction and coronary artery disease and multiple medical problems including depression and anxiety and poor social situation  He presents for routine  follow-up. During stable health times of November been able to document hypoxemia and him on room air at rest. Also during admissions he has been hypoxemic. Again today on room air 10 minutes his pulse ox was 97%. He insists that he gets very dyspneic when he turns his oxygen off and he needs 5 L of oxygen at all times. He does not check his pulse ox at home so we do not know his true oxygen levels when he is off oxygen. He has class III-for dyspnea. He is on oxygen. He lives with his mom was 64 years old and takes care of him. Overall he feels stable. [place] symptom assessment score documented below and it is obvious that is significant problems are fatigue, depression, anxiety and shortness of breath. He is open to undergo home palliative care program  [facility] Symptom Assessment Numerical Scale 0 is no problem -> 10 worst problem 10/14/2015    No Pain -> Worst pain 0  No Tiredness -> Worset tiredness 10  No Nausea -> Worst nausea 0  No Depression -> Worst depression 7  No Anxiety -> Worst Anxiety 8  No Drowsiness -> Worst Drowsiness 10  Best appetite-> Worst Appetitle 3  Best Feeling of well being -> Worst feeling 5  No dyspnea-> Worst dyspnea 8  Other problem (none -> severe) 10 - chest discomfort, when active  Completed by  patient         Last CT chest December 2016 and when compared to August 2016 no change in DIC pattern of ILD  Chest x-ray 10/04/2015: Reported as chronic changes or jaw baseline. I do not have these image for visualization  Lab work 07/26/2015: Shows a creatinine of 1.68 mg percent with a GFR of 43. Hemoglobin 13 g percent.  Cardiology visit with Dr. Domenic Polite 08/28/2015: There is discussion about switching his Lasix and metolazone and Demadex. Volume overload is likely multifactorial with RV dysfunction a significant component  Subjective:     Patient ID: MINOS HORAK, male   DOB: [birth year], 63 y.o.   MRN: [id number]  HPI   OV 04/23/2017  Chief  Complaint  Patient presents with   Follow-up    Pt states that his breathing is worse than before. C/o SOB all the time, coughing, and chest pain. 6L O2 24/7.   Follow-up ILD chronic hypoxemic respiratory failure. He did have a high-resolution CT scan of the chest in September 2018. The shows mostly pulmonary edema and fluid pattern. I personally not seen in the last year and a half. He says that he continues to live with his mom. [facility] is visiting him 3 times a week and giving supportive care. He is on a lot of opioids and anxiolytics. He easily is nodding off in the exam room. He doesn't seem interested in giving me a good history. He has no insight into his disease. [place] symptom assessment score shows significant worsening of fatigue and pain and depression and overall high  symptom burden.   OV 03/10/2018  Chief Complaint  Patient presents with   Follow-up    Pt states he is having a lot of pain with his lungs and it is very unbearable. Pt also states his SOB is a lot worse, coughs all the time, has a lot of back pain, is very unsteady on his feet, and also has occ. CP.    [name] , 63 y.o. , with dob 07-19-1956 and male ,Not Hispanic or Latino from [address] [place] [address] - presents to lung clinic for hronic hypoxemic respiratory failure that previously eyes seen was on the basis of ILD but CT scan last in 2018 showed that he did not have ILD. In fact he's had a follow-up CT scan of the chest May 2019 that does not show ILD. But he does have chronic hypoxemic respiratory failure. The basis of this is poorly understood but given his smoking history and 2016 pulmonary function test showing isolated reduction in diffusion capacity this would be all COPD/emphysema. He has missed many appointments and is very poor with history given his opioid dependence. Today he is a little bit more awake and he tells me that he continues to be on 5 L oxygen  nasal cannula. He says that he frequently dozes off and loses balance and falls.He also says for the last few months his lungs have been hurting but is no cough or congestion. He says he is on nebulizers but we do not have this in the med list. He does not know what nebulizers he is on. On exam today was significantly wheezing according to the medical assistant.   OV 08/01/2018  Chief Complaint  Patient presents with   Follow-up    Pt states his breathing has become worse since last visit, has a dry cough, has had chest pain, and states his lungs have hurt. Pt states he feels like his lungs are burning.      OV 08/01/2018  Subjective:  Patient ID: [name], male , DOB: 1955/11/12 , age 63 y.o. , MRN: [id number] , ADDRESS: [place] [address]   08/01/2018 -   Chief Complaint  Patient presents with   Follow-up    Pt states his breathing has become worse since last visit, has a dry cough, has had chest pain, and states his lungs have hurt. Pt states he feels like his lungs are burning.     HPI [name] 63 y.o. -presents for his follow-up of mixed emphysema with DIP/burned-out Langerhans' cell histiocytosis.  Since his last visit he got admitted November 2019 with acute on chronic hypoxemic respiratory failure.  In the interim he continues to smoke.  He is no longer in hospice but is requesting hospice back because he benefited from the extra layer of support of the palliative piece.  He thinks he might be dying but there is no evidence of that.  His primary care physician ordered ultrasound of the liver that is reported as diffuse hepatocellular disease not otherwise specified.  He will follow-up with [place] primary care physician about it.  Today try to requalify for oxygen.  We turned his oxygen off and walked him but even after 1 lap he became fatigued and wobbly and was at fall risk so we could not walk him any further.  His main issue is that he has significant  multiple symptomatology that includes significant fatigue and nonspecific abdominal pain and shortness of breath.  COPD CAT score showed a symptom burden at 33 with fatigue at rated as very severe, sleep quality is very severe limitation in activities is very severe and dyspnea is very severe and chest tightness is very severe.  The only thing he does not have is any sputum production but he has a moderate cough.       OV 09/08/2018  Subjective:  Patient ID: [name], male , DOB: 1956/04/02 , age 63 y.o. , MRN: [id number] , ADDRESS: [place] [id number]   09/08/2018 -   Chief Complaint  Patient presents with   Follow-up    PFT performed today. Pt states he has been worse since last visit. States he had the flu 1 week ago. Pt states his breathing is worse and has more fatigue than usual.   Interstitial lung disease with emphysema -surgical lung biopsy May 18, 2014 read in [place] as DIP with burned-out Langerhans' cell histiocytosis but reinterpreted February 2020 at [facility] as [facility] = smoking-related interstitial fibrosis associated with emphysema  Polypharmacy pain medications and heavy symptom burden  HPI FAY STEFKO 63 y.o. -returns for follow-up.  He was seen approximately 1 month ago.  At that point in time we decided to re-stage and re-figure his interstitial lung disease.  I discussed his 61 pathology slide at our interstitial lung disease conference.  Our local pulmonary pathologist feels strongly that there is no evidence of Langerhan histiocytosis X.  Instead he has smoking-related interstitial fibrosis (SRIF) with associated emphysema.  On his latest high-resolution CT chest there is not much of a description of ILD even though he has crackles on exam.  Report as below and I personally visualized that CT.  He has a steady progression and worsening of pulmonary function test over 3 or 4 years as documented below.  However, he is not  hypoxemic.  He is continuing to use oxygen and is requesting oxygen for symptomatic relief of dyspnea.  However when he turned the oxygen off and walked him he is not hypoxemic at all.  His room air at rest pulse ox is 97% with heart rate of 88/min.  We walked him 2 laps and he stopped and his pulse ox dropped to 92% and his heart rate rose to 94%.  While he does drop his pulse ox by more than 3 points he is not going below 88%.  On the other hand he is becoming very symptomatic with severe shortness of breath dizziness and staggering.  Medical assistant notes that he is always nodding off.  We administered a [place] symptom assessment score and he has significant poly-symptom burden.  Medication review shows polypharmacy.  He is unable to quit any of these.   CT chest high resolution 08/11/2018 Lungs/Pleura: Postoperative changes of wedge resection are again noted in the left lower lobe and tracking along the left major fissure, similar to the prior study. However, compared to the prior examination there is greatly increased pleuroparenchymal thickening and architectural distortion in the mid to lower left hemithorax with extensive parenchymal banding throughout this region. These findings are markedly asymmetric compared to the contralateral side where there is only minimal linear scarring in the posterior aspect of the right lower lobe in the most dependent portion of the lung. High-resolution images otherwise demonstrate no widespread areas of ground-glass attenuation, septal thickening, traction bronchiectasis or honeycombing elsewhere in the lungs. Inspiratory and expiratory imaging demonstrates mild air trapping, most evident in the left lower lobe.  No acute consolidative airspace disease. No pleural effusions. Diffuse bronchial wall thickening with mild centrilobular and paraseptal emphysema.  IMPRESSION: 1. Although there are areas of increasing fibrosis throughout the mid to lower  lung, these findings are clearly asymmetric with the contralateral side and are favored to be related to developing areas of pleuroparenchymal scarring, potentially related to prior surgery. The pattern is not a usual pattern for any typical interstitial lung disease, and there is no involvement of the right lung. 2. Diffuse bronchial wall thickening with mild centrilobular and paraseptal emphysema; imaging findings compatible with the reported clinical history of underlying COPD. 3. Air trapping, most evident in the left lower lobe, indicative of small airways disease. 4. Aortic atherosclerosis, in addition to left main and 3 vessel coronary artery disease. Please note that although the presence of coronary artery calcium documents the presence of coronary artery disease, the severity of this disease and any potential stenosis cannot be assessed on this non-gated CT examination. Assessment for potential risk factor modification, dietary therapy or pharmacologic therapy may be warranted, if clinically indicated. 5. Severe hepatic steatosis. 6. Lipomatous hypertrophy of the interatrial septum.  Aortic Atherosclerosis (ICD10-I70.0) and Emphysema (ICD10-J43.9).   Electronically Signed   By: Vinnie Langton M.D.   On: 08/12/2018 08:29    OV 05/03/2019  Subjective:  Patient ID: [name], male , DOB: 02-Jun-1956 , age 85 y.o. , MRN: [id number] , ADDRESS: [place] [address]   Interstitial lung disease with emphysema -surgical lung biopsy May 18, 2014 read in [place] as DIP with burned-out Langerhans' cell histiocytosis but reinterpreted February 2020 at [facility] as [facility] = smoking-related interstitial fibrosis associated with emphysema  05/03/2019 -   Chief Complaint  Patient presents with   COPD with acute exacerbation    Feels his COPD has gotten worse since the televisit on 04/20/19    Fu mixed ILD and emphysema with chronic  respiratory failure    HPI BIRL SEPE 63 y.o. -presents for follow-up.  Last seen by myself in early part of the year.  Since then has seen nurse practitioner 2 or 3 times through combination of physical visits and video/telephone visits.  1 of the times he is sent to the emergency department and admitted.  Last CT scan of the chest was in April 2020 and did not show any progression of his combination emphysema and ILD.  He had a blood gas at that time without any hypercapnia.  He tells me that he continues to live with his mom.  He uses between 6 and 8 L of oxygen and is stable.  He continues to have multiple symptoms including confusion early in the morning [he is noticed to be on a lot of pain meds] and chronic pain including pain in the right lateral chest.  In terms of his breathing he is stable.  Overall his quality of life and functional status is poor with an ECOG of 4.  Home palliative care used to visit with him but not anymore.  He wants the support again.  He is not interested in hospice.  He is asking for the shingles shot but we do not have this.  He will have his high-dose flu shot today.  Overall supportive care    Results for THAO, DAFFERN (MRN [id number]) as of 05/03/2019 10:53  Ref. Range 11/07/2018 17:17  pH, Ven Latest Ref Range: 7.250 - 7.430  7.373  pCO2, Ven Latest Ref Range: 44.0 -  60.0 mmHg 40.6 (L)  pO2, Ven Latest Ref Range: 32.0 - 45.0 mmHg 63.0 (H)    IMPRESSION: Chest:  1. New small focal area of peripheral ground-glass density in the anterior right upper lobe is likely infectious or inflammatory. 2. Unchanged pleuroparenchymal scarring and fibrosis in the mid to lower left hemithorax. 3.  Emphysema (ICD10-J43.9). 4.  Aortic atherosclerosis (ICD10-I70.0).  Abdomen and pelvis:  1.  No acute intra-abdominal process. 2. Hepatic steatosis. 3. Prior right nephrectomy. 4. Bilateral femoral head avascular necrosis.   Electronically Signed   By:  Titus Dubin M.D.   On: 11/07/2018 23:33    OV 06/08/2019  Subjective:  Patient ID: [name], male , DOB: 1956-01-05 , age 9 y.o. , MRN: [id number] , ADDRESS: [place] [id number]    Interstitial lung disease with emphysema -surgical lung biopsy May 18, 2014 read in [place] as DIP with burned-out Langerhans' cell histiocytosis but reinterpreted February 2020 at [facility] as [facility] = smoking-related interstitial fibrosis associated with emphysema  06/08/2019 -   Chief Complaint  Patient presents with   Hospitalization Follow-up    Pt in hosp 10/30-11/1.  Pt states he has had good days and bad days since being out of the hospital. Pt states he does have alot of SOB, has an occ cough due to getting choked, and has chest tightness.      Interstitial lung disease with emphysema -surgical lung biopsy May 18, 2014 read in [place] as DIP with burned-out Langerhans' cell histiocytosis but reinterpreted February 2020 at [facility] as [facility] = smoking-related interstitial fibrosis associated with emphysema  Chronic hypoxemic respiratory failure due to the above on 6 L of oxygen at baseline  Ongoing smoking  Ongoing polypharmacy and opioid use  HPI SHYLER WEMPE 63 y.o. -last seen mid October 2020.  Then to his end of October 2020 he got admitted with sepsis elevated lactic acid.  Diagnosed to have pneumonia based on chest x-ray and then treated with antibiotics.  Discharge November 1 second 2020 on Augmentin.  He is now back to his baseline of 6 L oxygen and is beginning to feel well.  Nevertheless overall he still feels deconditioned.  As usual he says his lungs hurt.  This visit was set up mainly in the context of his recent admission.  Admission labs reviewed and in particular he seems to have developed hypercapnia at least he was hypercapnic in the hospital.  He is no longer using his CPAP.  Unclear why.  He is interested in getting this  back.  He is interested in BiPAP as well if he qualifies.  He continues to have a large symptom burden as documented below.  Last CT scan of the chest April 2020    [facility] Symptom Assessment Numerical Scale 0 is no problem -> 10 worst problem 10/14/2015   04/23/2017  08/01/2018  06/08/2019   No Pain -> Worst pain 0 7 9 9   No Tiredness -> Worset tiredness 10 9 10 10   No Nausea -> Worst nausea 0 0 0 3  No Depression -> Worst depression 7 10 5 9   No Anxiety -> Worst Anxiety 8 5 5 8   No Drowsiness -> Worst Drowsiness 10 3 (though was nodding off) 1 9  Best appetite-> Worst Appetitle 3 4 3 3   Best Feeling of well being -> Worst feeling 5 7 1 4   Cough   8 9  No dyspnea-> Worst dyspnea 8 8 10  10  Other problem (none -> severe) 10 - chest discomfort, when active 7 leg pain 10 10 chest discomfort and other miscellaneous  Completed by  patient patient patient patient          Results for AJAN, CUTRER (MRN [id number]) as of 09/08/2018 11:46  Ref. Range 04/13/2014 11:43 09/07/2014 11:39 09/08/2018 10:21  FVC-Pre Latest Units: L 4.37 4.03 3.80  FVC-%Pred-Pre Latest Units: % 90 83 81   Results for KARA, HOLLIGAN (MRN [id number]) as of 09/08/2018 11:46  Ref. Range 04/13/2014 11:43 09/07/2014 11:39 09/08/2018 10:21  DLCO unc Latest Units: ml/min/mmHg 17.92 16.41 13.84  DLCO unc % pred Latest Units: % 55 50 51   Results for HENRYK, VANESS (MRN [id number]) as of 06/08/2019 11:39  Ref. Range 05/19/2019 15:00  pH, Arterial Latest Ref Range: 7.350 - 7.450  7.346 (L)  pCO2 arterial Latest Ref Range: 32.0 - 48.0 mmHg 52.1 (H)  pO2, Arterial Latest Ref Range: 83.0 - 108.0 mmHg 73.5 (L)   Results for SHONNON, MCFARLEN (MRN [id number]) as of 06/08/2019 11:39  Ref. Range 05/27/2018 12:41 05/30/2018 23:47 05/31/2018 05:33 06/01/2018 03:21 06/02/2018 07:36 11/07/2018 16:58 11/08/2018 04:53 11/09/2018 08:38 12/23/2018 12:28 05/19/2019 14:23 05/20/2019 05:11  Creatinine Latest Ref Range: 0.61 - 1.24 mg/dL 1.79 (H) 1.85  (H) 1.60 (H) 1.42 (H) 1.27 (H) 1.91 (H) 1.40 (H) 1.14 1.80 (H) 1.65 (H) 1.32 (H)    Results for MACEDONIO, MINCEY (MRN [id number]) as of 06/08/2019 11:39  Ref. Range 02/13/2018 22:24 05/27/2018 12:41 05/30/2018 23:47 05/31/2018 05:33 06/01/2018 03:21 11/07/2018 16:58 11/07/2018 17:17 11/09/2018 08:38 12/23/2018 12:28 05/19/2019 14:23 05/20/2019 05:11  Hemoglobin Latest Ref Range: 13.0 - 17.0 g/dL 11.6 (L) 12.3 (L) 12.6 (L) 11.9 (L) 11.9 (L) 13.2 13.6 13.0 13.5 12.1 (L) 12.0 (L)   ROS - per HPI     has a past medical history of Anxiety, Arthritis, Cholecystitis, acute (12/20/2013), Cocaine abuse ([place]), COPD (chronic obstructive pulmonary disease) ([place]), Coronary atherosclerosis of native coronary artery, Depression, Essential hypertension, benign, GERD (gastroesophageal reflux disease), Headache, Hepatitis (Late 1970s), History of nephrolithiasis, History of pneumonia, History of stroke, Hyperlipidemia, Noncompliance, Renal cell carcinoma ([place]), Sleep apnea, and Type 2 diabetes mellitus ([place]) (2011).   reports that he has been smoking cigarettes. He started smoking about 47 years ago. He has a 41.00 pack-year smoking history. He has never used smokeless tobacco.  Past Surgical History:  Procedure Laterality Date   APPENDECTOMY  1970's   CHOLECYSTECTOMY N/A 12/22/2013   Procedure: LAPAROSCOPIC CHOLECYSTECTOMY ;  Surgeon: [name], MD;  Location: [place];  Service: General;  Laterality: N/A;   ENDARTERECTOMY Right 10/08/2014   Procedure: Right ENDARTERECTOMY CAROTID;  Surgeon: [name], MD;  Location: [place];  Service: Vascular;  Laterality: Right;   LAPAROSCOPIC LYSIS OF ADHESIONS  02/21/2014   Procedure: LAPAROSCOPIC LYSIS OF ADHESIONS EXTINSIVE;  Surgeon: [name], MD;  Location: [facility];  Service: Urology;;   LEFT HEART CATHETERIZATION WITH CORONARY ANGIOGRAM N/A 06/02/2012   Procedure: LEFT HEART CATHETERIZATION WITH CORONARY ANGIOGRAM;  Surgeon: [name], MD;  [facility]  CATH LAB;  Service: Cardiovascular;  Laterality: N/A;   LEFT HEART CATHETERIZATION WITH CORONARY ANGIOGRAM N/A 03/09/2013   Procedure: LEFT HEART CATHETERIZATION WITH CORONARY ANGIOGRAM;  Surgeon: [name], MD;  Location: [facility] CATH LAB;  Service: Cardiovascular;  Laterality: N/A;   LEFT HEART CATHETERIZATION WITH CORONARY ANGIOGRAM N/A 12/20/2013   Procedure: LEFT HEART CATHETERIZATION WITH CORONARY ANGIOGRAM;  Surgeon: [name], MD;  Location: [place] CATH LAB;  Service: Cardiovascular;  Laterality: N/A;   LUNG BIOPSY Left 05/18/2014   Procedure: LUNG BIOPSY left upper lobe & left lower lobe;  Surgeon: [name], MD;  Location: [facility];  Service: Thoracic;  Laterality: Left;   PATCH ANGIOPLASTY Right 10/08/2014   Procedure: PATCH ANGIOPLASTY Right Carotid;  Surgeon: [name], MD;  Location: [facility];  Service: Vascular;  Laterality: Right;   ROBOT ASSISTED LAPAROSCOPIC NEPHRECTOMY Right 02/21/2014   Procedure: ROBOTIC ASSISTED LAPAROSCOPIC RIGHT NEPHRECTOMY ;  Surgeon: [name], MD;  Location: [facility];  Service: Urology;  Laterality: Right;   VIDEO ASSISTED THORACOSCOPY Left 05/18/2014   Procedure: LEFT VIDEO ASSISTED THORACOSCOPY;  Surgeon: [name], MD;  Location: [facility];  Service: Thoracic;  Laterality: Left;   VIDEO BRONCHOSCOPY  05/18/2014   Procedure: VIDEO BRONCHOSCOPY;  Surgeon: [name], MD;  Location: [name];  Service: Thoracic;;    Allergies  Allergen Reactions   Oxycodone Itching and Nausea Only    Pt is able to tolerate if taken with benadryl    Immunization History  Administered Date(s) Administered   Fluad Quad(high Dose 65+) 05/03/2019   Influenza Split 04/04/2014, 03/27/2015   Pneumococcal Polysaccharide-23 11/04/2012    Family History  Problem Relation Age of Onset   Cancer Mother        Thyroid - living in her 60's.   Hypertension Other    Diabetes Other    Stroke Other    Lung cancer Father    CAD  Father    Cancer Maternal Grandmother        Breast   Cancer Maternal Grandfather        Throat and stomach   CAD Brother      Current Outpatient Medications:    ALPRAZolam (XANAX) 0.5 MG tablet, Take 1 tablet (0.5 mg total) by mouth 3 (three) times daily as needed for anxiety., Disp: , Rfl: 0   atorvastatin (LIPITOR) 20 MG tablet, Take 20 mg by mouth at bedtime., Disp: , Rfl: 1   clopidogrel (PLAVIX) 75 MG tablet, TAKE ONE TABLET BY MOUTH DAILY, Disp: 90 tablet, Rfl: 3   diltiazem (CARDIZEM CD) 180 MG 24 hr capsule, Take 1 capsule (180 mg total) by mouth daily., Disp: 90 capsule, Rfl: 2   docusate sodium (COLACE) 100 MG capsule, Take 200 mg by mouth at bedtime. , Disp: , Rfl:    esomeprazole (NEXIUM) 40 MG capsule, Take 40 mg by mouth daily at 12 noon. Pt only take as needed, Disp: , Rfl:    FLUoxetine (PROZAC) 10 MG capsule, Take 10 mg by mouth every morning. Take with 40 mg capsule = 50 mg total, Disp: , Rfl: 0   FLUoxetine (PROZAC) 40 MG capsule, Take 40 mg by mouth daily. Take with 10 mg capsule = 50 mg total, Disp: , Rfl:    gabapentin (NEURONTIN) 300 MG capsule, Take 900 mg by mouth 2 (two) times daily. , Disp: , Rfl:    Glycopyrrolate-Formoterol (BEVESPI AEROSPHERE) 9-4.8 MCG/ACT AERO, Inhale 2 puffs into the lungs 2 (two) times daily., Disp: 1 Inhaler, Rfl: 11   insulin aspart (NOVOLOG) 100 UNIT/ML injection, Inject 30 Units into the skin 3 (three) times daily with meals. , Disp: , Rfl:    isosorbide mononitrate (IMDUR) 60 MG 24 hr tablet, TAKE ONE TABLET BY MOUTH DAILY, Disp: 90 tablet, Rfl: 1   LANTUS SOLOSTAR 100 UNIT/ML Solostar Pen, Inject 115 Units into the skin 2 (two) times  daily. , Disp: , Rfl: 4   linagliptin (TRADJENTA) 5 MG TABS tablet, Take 5 mg by mouth daily., Disp: , Rfl:    LORazepam (ATIVAN) 1 MG tablet, Take 0.5 mg by mouth at bedtime. , Disp: , Rfl:    nitroGLYCERIN (NITROSTAT) 0.4 MG SL tablet, Place 1 tablet (0.4 mg total) under the tongue  every 5 (five) minutes as needed for chest pain. IF 3 TABLETS ARE NECESSARY CALL 911, Disp: 25 tablet, Rfl: 3   oxybutynin (DITROPAN-XL) 10 MG 24 hr tablet, Take 10 mg by mouth daily., Disp: , Rfl: 11   oxyCODONE-acetaminophen (PERCOCET) 10-325 MG tablet, Take 1 tablet by mouth every 6 (six) hours as needed for pain., Disp: 30 tablet, Rfl: 0   potassium chloride SA (K-DUR,KLOR-CON) 20 MEQ tablet, Take 20 mEq by mouth 2 (two) times daily. , Disp: , Rfl:    pregabalin (LYRICA) 25 MG capsule, Take 25 mg by mouth 2 (two) times daily., Disp: , Rfl:    Probiotic Product (PROBIOTIC PO), Take 1 capsule by mouth daily., Disp: , Rfl:    tamsulosin (FLOMAX) 0.4 MG CAPS capsule, Take 0.4 mg by mouth daily. , Disp: , Rfl:    torsemide (DEMADEX) 20 MG tablet, Take 60 mg by mouth 2 (two) times daily., Disp: , Rfl:    zolpidem (AMBIEN) 5 MG tablet, Take 5 mg by mouth at bedtime as needed. for insomnia, Disp: , Rfl:       Objective:   Vitals:   06/08/19 1105  BP: 126/72  Pulse: (!) 103  SpO2: 96%  Weight: 235 lb 9.6 oz (106.9 kg)  Height: 5\' 9"  (1.753 m)    Estimated body mass index is 34.79 kg/m as calculated from the following:   Height as of this encounter: 5\' 9"  (1.753 m).   Weight as of this encounter: 235 lb 9.6 oz (106.9 kg).  @WEIGHTCHANGE @  [employer]   06/08/19 1105  Weight: 235 lb 9.6 oz (106.9 kg)     Physical Exam  General Appearance:    Alert, cooperative, no distress, appears stated age - older , Deconditioned looking - yes , OBESE  - yes, Sitting on Wheelchair -  no  Head:    Normocephalic, without obvious abnormality, atraumatic  Eyes:    PERRL, conjunctiva/corneas clear,  Ears:    Normal TM's and external ear canals, both ears  Nose:   Nares normal, septum midline, mucosa normal, no drainage    or sinus tenderness. OXYGEN ON  - yes . Patient is @ 6L [place]   Throat:   Lips, mucosa, and tongue normal; teeth and gums normal. Cyanosis on lips - no  Neck:   Supple,  symmetrical, trachea midline, no adenopathy;    thyroid:  no enlargement/tenderness/nodules; no carotid   bruit or JVD  Back:     Symmetric, no curvature, ROM normal, no CVA tenderness  Lungs:     Distress - no , Wheeze no, Barrell Chest - no, Purse lip breathing - no, Crackles - mayb   Chest Wall:    No tenderness or deformity.    Heart:    Regular rate and rhythm, S1 and S2 normal, no rub   or gallop, Murmur - no  Breast Exam:    NOT DONE  Abdomen:     Soft, non-tender, bowel sounds active all four quadrants,    no masses, no organomegaly. Visceral obesity - yes  Genitalia:   NOT DONE  Rectal:   NOT DONE  Extremities:  Extremities - normal, Has Cane - n, Clubbing - YES, Edema - no  Pulses:   2+ and symmetric all extremities  Skin:   Stigmata of Connective Tissue Disease - no  Lymph nodes:   Cervical, supraclavicular, and axillary nodes normal  Psychiatric:  Neurologic:   Pleasant - yes, Anxious - no, Flat affect - yes  CAm-ICU - neg, Alert and Oriented x 3 - yes, Moves all 4s - yes, Speech - normal, Cognition - intact. MORE AWAKE THAN USUAL           Assessment:       ICD-10-CM   1. Chronic respiratory failure with hypoxia ([name])  J96.11   2. ILD (interstitial lung disease) ([place])  J84.9   3. Other emphysema ([place])  J43.8   4. Tobacco abuse  Z72.0      He has recovered well from his recent sepsis and pneumonia admission.  He just weigh showed t down by his symptoms from multiple different etiologies and polypharmacy.  Review of his blood gases that he might have picked up hypercapnia in the last few years.  If this is indeed true in the outpatient setting then he might benefit from nighttime BiPAP.  Therefore we will check for this.  We will also check for progression of ILD.    Plan:     Patient Instructions     ICD-10-CM   1. Chronic respiratory failure with hypoxia ([name])  J96.11   2. ILD (interstitial lung disease) ([place])  J84.9   3. Other emphysema ([place])  J43.8       Do blood gas test next week or two - will call with results   - if CO2 high - will have you come in to do a BiPAP qualification visit  DO HRCT last week Jan 2021/early feb 2021 Do spirometrrn and dlco last jan 2021/early feb 2021  Continue 6 L o2  Quit smoking  Continue other medicines  Followup  - will call with blood gas results - return to ILD clinic in jan/feb 2021 but after CT and breathing test      > 50% of this > 25 min visit spent in face to face counseling or coordination of care - by this undersigned MD - Dr Brand Males. This includes one or more of the following documented above: discussion of test results, diagnostic or treatment recommendations, prognosis, risks and benefits of management options, instructions, education, compliance or risk-factor reduction    SIGNATURE    Dr. Brand Males, M.D., F.C.C.P,  Pulmonary and Critical Care Medicine Staff Physician, [place] Director - Interstitial Lung Disease  Program  [facility] [employer], [place], [address]  Pager: [phone], If no answer or between  15:00h - 7:00h: call [phone] Telephone: [phone]  11:53 AM 06/08/2019

## 2019-06-08 NOTE — Patient Instructions (Addendum)
ICD-10-CM   1. Chronic respiratory failure with hypoxia ([name])  J96.11   2. ILD (interstitial lung disease) ([place])  J84.9   3. Other emphysema ([place])  J43.8     Do ESAS questions 06/08/2019   Do blood gas test next week or two - will call with results   - if CO2 high - will have you come in to do a BiPAP qualification visit  DO HRCT last week Jan 2021/early feb 2021 Do spirometrrn and dlco last jan 2021/early feb 2021  Continue 6 L o2  Quit smoking  Continue other medicines  Followup  - will call with blood gas results - return to ILD clinic in jan/feb 2021 but after CT and breathing test

## 2019-06-12 ENCOUNTER — Ambulatory Visit (HOSPITAL_COMMUNITY)
Admission: RE | Admit: 2019-06-12 | Discharge: 2019-06-12 | Disposition: A | Discharge status: Home / Self Care | Payer: Medicaid Other | Source: Ambulatory Visit | Attending: Physician Assistant | Admitting: Physician Assistant

## 2019-06-12 ENCOUNTER — Other Ambulatory Visit: Payer: Self-pay

## 2019-06-12 DIAGNOSIS — J9611 Chronic respiratory failure with hypoxia: Secondary | ICD-10-CM | POA: Diagnosis present

## 2019-06-12 DIAGNOSIS — J849 Interstitial pulmonary disease, unspecified: Secondary | ICD-10-CM | POA: Diagnosis present

## 2019-06-12 LAB — BLOOD GAS, ARTERIAL
Acid-Base Excess: 3.6 mmol/L — ABNORMAL HIGH (ref 0.0–2.0)
Bicarbonate: 28 mmol/L (ref 20.0–28.0)
Drawn by: 39899
FIO2: 44
O2 Saturation: 97.6 %
Patient temperature: 37
pCO2 arterial: 45 mmHg (ref 32.0–48.0)
pH, Arterial: 7.41 (ref 7.350–7.450)
pO2, Arterial: 93.1 mmHg (ref 83.0–108.0)

## 2019-06-13 NOTE — Progress Notes (Signed)
Spoke with pt and notified of results per Dr. Ramaswamy. Pt verbalized understanding and denied any questions.  

## 2019-07-19 ENCOUNTER — Other Ambulatory Visit: Payer: Self-pay | Admitting: Cardiology

## 2019-08-05 ENCOUNTER — Other Ambulatory Visit: Payer: Self-pay | Admitting: Cardiology

## 2019-08-21 ENCOUNTER — Other Ambulatory Visit: Payer: Medicaid Other

## 2019-08-30 ENCOUNTER — Other Ambulatory Visit: Payer: Self-pay

## 2019-08-30 ENCOUNTER — Ambulatory Visit
Admission: RE | Admit: 2019-08-30 | Discharge: 2019-08-30 | Disposition: A | Discharge status: Home / Self Care | Payer: Medicaid Other | Source: Ambulatory Visit | Attending: Internal Medicine | Admitting: Internal Medicine

## 2019-08-30 DIAGNOSIS — J849 Interstitial pulmonary disease, unspecified: Secondary | ICD-10-CM

## 2019-09-01 ENCOUNTER — Other Ambulatory Visit (HOSPITAL_COMMUNITY)
Admission: RE | Admit: 2019-09-01 | Discharge: 2019-09-01 | Disposition: A | Discharge status: Home / Self Care | Payer: Medicaid Other | Source: Ambulatory Visit | Attending: Internal Medicine | Admitting: Internal Medicine

## 2019-09-01 DIAGNOSIS — Z01812 Encounter for preprocedural laboratory examination: Secondary | ICD-10-CM | POA: Diagnosis present

## 2019-09-01 DIAGNOSIS — Z20822 Contact with and (suspected) exposure to covid-19: Secondary | ICD-10-CM | POA: Insufficient documentation

## 2019-09-01 LAB — SARS CORONAVIRUS 2 (TAT 6-24 HRS): SARS Coronavirus 2: NEGATIVE

## 2019-09-01 NOTE — Progress Notes (Signed)
The pulmonary inflammation has cleared up. HE just has emphysema now

## 2019-09-04 ENCOUNTER — Ambulatory Visit: Payer: Medicaid Other | Admitting: Internal Medicine

## 2019-09-04 ENCOUNTER — Other Ambulatory Visit: Payer: Self-pay

## 2019-09-04 DIAGNOSIS — J849 Interstitial pulmonary disease, unspecified: Secondary | ICD-10-CM

## 2019-09-04 DIAGNOSIS — J9611 Chronic respiratory failure with hypoxia: Secondary | ICD-10-CM | POA: Diagnosis not present

## 2019-09-04 LAB — PULMONARY FUNCTION TEST
DL/VA % pred: 71 %
DL/VA: 2.96 ml/min/mmHg/L
DLCO unc % pred: 52 %
DLCO unc: 14.15 ml/min/mmHg
FEF 25-75 Pre: 2.55 L/sec
FEF2575-%Pred-Pre: 91 %
FEV1-%Pred-Pre: 77 %
FEV1-Pre: 2.68 L
FEV1FVC-%Pred-Pre: 103 %
FEV6-%Pred-Pre: 78 %
FEV6-Pre: 3.47 L
FEV6FVC-%Pred-Pre: 105 %
FVC-%Pred-Pre: 74 %
FVC-Pre: 3.47 L
Pre FEV1/FVC ratio: 77 %
Pre FEV6/FVC Ratio: 100 %

## 2019-09-04 NOTE — Progress Notes (Signed)
Spiro/DLCO performed today. 

## 2019-09-12 ENCOUNTER — Ambulatory Visit: Payer: Medicaid Other | Admitting: Internal Medicine

## 2019-09-14 ENCOUNTER — Other Ambulatory Visit: Payer: Self-pay | Admitting: Cardiology

## 2019-10-09 ENCOUNTER — Other Ambulatory Visit: Payer: Self-pay | Admitting: Cardiology

## 2019-10-27 ENCOUNTER — Other Ambulatory Visit: Payer: Self-pay | Admitting: Cardiology

## 2019-10-30 ENCOUNTER — Telehealth (HOSPITAL_COMMUNITY): Payer: Self-pay | Admitting: *Deleted

## 2019-10-30 ENCOUNTER — Ambulatory Visit: Payer: Medicaid Other | Admitting: Internal Medicine

## 2019-10-30 ENCOUNTER — Other Ambulatory Visit: Payer: Self-pay

## 2019-10-30 ENCOUNTER — Encounter: Payer: Self-pay | Admitting: Internal Medicine

## 2019-10-30 VITALS — BP 124/74 | HR 92 | Ht 69.0 in | Wt 236.2 lb

## 2019-10-30 DIAGNOSIS — J441 Chronic obstructive pulmonary disease with (acute) exacerbation: Secondary | ICD-10-CM | POA: Diagnosis not present

## 2019-10-30 DIAGNOSIS — Z720 Tobacco use: Secondary | ICD-10-CM

## 2019-10-30 DIAGNOSIS — J9611 Chronic respiratory failure with hypoxia: Secondary | ICD-10-CM | POA: Diagnosis not present

## 2019-10-30 DIAGNOSIS — J988 Other specified respiratory disorders: Secondary | ICD-10-CM

## 2019-10-30 MED ORDER — PREDNISONE 10 MG PO TABS: ORAL_TABLET | ORAL | 0 refills | Status: DC

## 2019-10-30 MED ORDER — CEPHALEXIN 500 MG PO CAPS: 500.0000 mg | ORAL_CAPSULE | Freq: Three times a day (TID) | ORAL | 0 refills | Status: DC

## 2019-10-30 NOTE — Telephone Encounter (Signed)
Received referral from Dr. Chase Caller for this pt to participate in pulmonary rehab.  Noted that pt ha medicaid coverage.  Currently at this time Medicaid does not reimburse for pulmonary rehab. Called and left message for pt to return call.  Would like to discuss with pt his option for pulmonary rehab  Maintenance which is self pay at 100.00 per month.  Should also consider since pt has Medicaid if the goal for increase exercise toleration and mobility be achieved with Physical therapy. Cherre Huger, BSN Cardiac and Training and development officer

## 2019-10-30 NOTE — Patient Instructions (Signed)
ICD-10-CM   1. COPD with acute exacerbation ([place])  J44.1   2. Congestion of upper airway  J98.8   3. Chronic respiratory failure with hypoxia ([name])  J96.11   4. Tobacco abuse  Z72.0     On your latest CT scan there is no more evidence of interstitial lung disease/pulmonary fibrosis Instead the dominant feature is emphysema  Currently might be in a bit of COPD flareup or emphysema flareup  In addition you seem to be having upper airway wheeze which means the vocal cords need to be evaluated  Breathing test shows some decline which might be related to flareup  Overall shortness of breath is multifactorial and this includes emphysema, weight, physical deconditioning and other reasons  Plan -Check alpha-1 antitrypsin levels and phenotype in the setting of emphysema -Take cephalexin 5 mg 3 times daily x5 days - Please take prednisone 40 mg x1 day, then 30 mg x1 day, then 20 mg x1 day, then 10 mg x1 day, and then 5 mg x1 day and stop -Refer to ENT for evaluation for vocal cords -Continue oxygen as before -Continue Bevespi inhaler therapy and other nebulizers before -Refer to pulmonary rehabilitation  Follow-up -3 months or sooner if needed

## 2019-10-30 NOTE — Progress Notes (Signed)
OV 04/10/2015  Chief Complaint  Patient presents with  . Follow-up    Pt states his breathing has worsened since last OV. Pt was admitted to [facility] for CP and syncope. Pt c/o increase in DOE, prod cough with dark brown mucus, chest disfomfort when active.    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 At that point in time spirometry showed a significant decline in forced vital capacity from 4 L at baseline to 2.6 L. He was using 4 L of oxygen at rest and 6 L with exertion at home. However we could not convincingly document hypoxemia with exertion on room air in the office at that time. Therefore to this and I ordered pulmonary function tests and CT scan of the chest. He did have CT scan of the chest in August 2016. There is no report on progression compared to February 2016 but I suspect the findings are similar. While he underwent pulmonary function test he had cough syncope and was admitted to the hospital. Cardiology notes reviewed and the final diagnosis was cough syncope. He he now presents for follow-up. He continues to be miserable with class IV dyspnea. He uses 4-6 liters of oxygen because he subjectively feels the need to do that. He does not monitor his pulse ox at home. He says that he continues to have cough syncope at home and has fallen several times. He has now moved to live with his 57 year old mom. He is socially isolated otherwise. He feels like he needs an extra layer of help at home. There are no other issues  Pulse oximetry on room air at rest and walking desaturation test 185 feet 3 laps on room air: RA rest after 15 min off o2, pulse o98% and lowest pulse ox with forehead probe was 90%. He did get dizzy with coughing   OV 06/05/15 64 year old man with tobacco abuse and history of interstitial lung disease,  desquamative interstitial pneumonitis and burnt out Langerhans' cell histiocytosis, hypoxemic respiratory failure; untreated OSA unable to tolerate CPAP. He probably also has some degree of superimposed COPD.  He presents today with two weeks of increased dyspnea, . He is having paroxysms of cough, can lead to pre-syncope / syncope. Last was 2 days ago. He has LE edema, probable more than his baseline. He is using duoneb bid, symbicort bid, albuterol prn - averages once a day. He complains of dry mouth. He took extra lasix after last OV here with TP, helped his edema but not his dyspnea.     OV 07/05/2015  Chief Complaint  Patient presents with  . Follow-up    Pt states his SOB has worsened since last OV. Pt also c/o increase in nonprod cough, pt states he frequently coughs when eating or drinking. Pt c/o right lateral chest pain when coughing. Pt denies f/c/s.   Admit    64yo male smoker with  ILD(DIP)  on Oxygen .  Polysubstance abuse hx.   08/09/2015 [facility] follow up  Patient returns for a post hospital follow-up. Patient was recently admitted with acute on chronic respiratory failure with underlying interstitial lung disease for  acute on chronic diastolic heart failure. He improved with diuresis .  He says he is feeling improved. Has decreased shortness of breath. Unfortunately continues to smoke. We discussed smoking cessation. He denies any hemoptysis, chest pain, orthopnea,  PND, or increased leg swelling.    OV 10/14/2015  Chief Complaint  Patient presents with  . Follow-up    Still having SOB, coughing and mild wheezing. Pt states that he feels like fluid is coming back especially on left side.    Follow-up interstitial lung disease and chronic hypoxemic respiratory failure associated with volume overload and diastolic dysfunction and coronary artery disease and multiple medical problems including depression and anxiety and poor social situation  He presents for routine  follow-up. During stable health times of November been able to document hypoxemia and him on room air at rest. Also during admissions he has been hypoxemic. Again today on room air 10 minutes his pulse ox was 97%. He insists that he gets very dyspneic when he turns his oxygen off and he needs 5 L of oxygen at all times. He does not check his pulse ox at home so we do not know his true oxygen levels when he is off oxygen. He has class III-for dyspnea. He is on oxygen. He lives with his mom was 73 years old and takes care of him. Overall he feels stable. [place] symptom assessment score documented below and it is obvious that is significant problems are fatigue, depression, anxiety and shortness of breath. He is open to undergo home palliative care program  [facility] Symptom Assessment Numerical Scale 0 is no problem -> 10 worst problem 10/14/2015    No Pain -> Worst pain 0  No Tiredness -> Worset tiredness 10  No Nausea -> Worst nausea 0  No Depression -> Worst depression 7  No Anxiety -> Worst Anxiety 8  No Drowsiness -> Worst Drowsiness 10  Best appetite-> Worst Appetitle 3  Best Feeling of well being -> Worst feeling 5  No dyspnea-> Worst dyspnea 8  Other problem (none -> severe) 10 - chest discomfort, when active  Completed by  patient         Last CT chest December 2016 and when compared to August 2016 no change in DIC pattern of ILD  Chest x-ray 10/04/2015: Reported as chronic changes or jaw baseline. I do not have these image for visualization  Lab work 07/26/2015: Shows a creatinine of 1.68 mg percent with a GFR of 43. Hemoglobin 13 g percent.  Cardiology visit with Dr. Domenic Polite 08/28/2015: There is discussion about switching his Lasix and metolazone and Demadex. Volume overload is likely multifactorial with RV dysfunction a significant component  Subjective:     Patient ID: [name], male   DOB: [birth year], 64 y.o.   MRN: [id number]  HPI   OV 04/23/2017  Chief  Complaint  Patient presents with  . Follow-up    Pt states that his breathing is worse than before. C/o SOB all the time, coughing, and chest pain. 6L O2 24/7.   Follow-up ILD chronic hypoxemic respiratory failure. He did have a high-resolution CT scan of the chest in September 2018. The shows mostly pulmonary edema and fluid pattern. I personally not seen in the last year and a half. He says that he continues to live with his mom. [facility] is visiting him 3 times a week and giving supportive care. He is on a lot of opioids and anxiolytics. He easily is nodding off in the exam room. He doesn't seem interested in giving me a good history. He has no insight into his disease. [place] symptom assessment score shows significant worsening of fatigue and pain and depression and overall high  symptom burden.   OV 03/10/2018  Chief Complaint  Patient presents with  . Follow-up    Pt states he is having a lot of pain with his lungs and it is very unbearable. Pt also states his SOB is a lot worse, coughs all the time, has a lot of back pain, is very unsteady on his feet, and also has occ. CP.    [name] , 64 y.o. , with dob 15-Jan-1956 and male ,Not Hispanic or Latino from [address] [place] [address] - presents to lung clinic for hronic hypoxemic respiratory failure that previously eyes seen was on the basis of ILD but CT scan last in 2018 showed that he did not have ILD. In fact he's had a follow-up CT scan of the chest May 2019 that does not show ILD. But he does have chronic hypoxemic respiratory failure. The basis of this is poorly understood but given his smoking history and 2016 pulmonary function test showing isolated reduction in diffusion capacity this would be all COPD/emphysema. He has missed many appointments and is very poor with history given his opioid dependence. Today he is a little bit more awake and he tells me that he continues to be on 5 L oxygen  nasal cannula. He says that he frequently dozes off and loses balance and falls.He also says for the last few months his lungs have been hurting but is no cough or congestion. He says he is on nebulizers but we do not have this in the med list. He does not know what nebulizers he is on. On exam today was significantly wheezing according to the medical assistant.   OV 08/01/2018  Chief Complaint  Patient presents with  . Follow-up    Pt states his breathing has become worse since last visit, has a dry cough, has had chest pain, and states his lungs have hurt. Pt states he feels like his lungs are burning.      OV 08/01/2018  Subjective:  Patient ID: [name], male , DOB: August 06, 1955 , age 85 y.o. , MRN: [id number] , ADDRESS: [place] [id number]   08/01/2018 -   Chief Complaint  Patient presents with  . Follow-up    Pt states his breathing has become worse since last visit, has a dry cough, has had chest pain, and states his lungs have hurt. Pt states he feels like his lungs are burning.     HPI [name] 64 y.o. -presents for his follow-up of mixed emphysema with DIP/burned-out Langerhans' cell histiocytosis.  Since his last visit he got admitted November 2019 with acute on chronic hypoxemic respiratory failure.  In the interim he continues to smoke.  He is no longer in hospice but is requesting hospice back because he benefited from the extra layer of support of the palliative piece.  He thinks he might be dying but there is no evidence of that.  His primary care physician ordered ultrasound of the liver that is reported as diffuse hepatocellular disease not otherwise specified.  He will follow-up with [place] primary care physician about it.  Today try to requalify for oxygen.  We turned his oxygen off and walked him but even after 1 lap he became fatigued and wobbly and was at fall risk so we could not walk him any further.  His main issue is that he has significant  multiple symptomatology that includes significant fatigue and nonspecific abdominal pain and shortness of breath.  COPD CAT score showed a symptom burden at 33 with fatigue at rated as very severe, sleep quality is very severe limitation in activities is very severe and dyspnea is very severe and chest tightness is very severe.  The only thing he does not have is any sputum production but he has a moderate cough.       OV 09/08/2018  Subjective:  Patient ID: [name], male , DOB: 1956-03-16 , age 52 y.o. , MRN: [id number] , ADDRESS: [place] [id number]   09/08/2018 -   Chief Complaint  Patient presents with  . Follow-up    PFT performed today. Pt states he has been worse since last visit. States he had the flu 1 week ago. Pt states his breathing is worse and has more fatigue than usual.   Interstitial lung disease with emphysema -surgical lung biopsy May 18, 2014 read in [place] as DIP with burned-out Langerhans' cell histiocytosis but reinterpreted February 2020 at [facility] as [facility] = smoking-related interstitial fibrosis associated with emphysema  Polypharmacy pain medications and heavy symptom burden  HPI [name] 64 y.o. -returns for follow-up.  He was seen approximately 1 month ago.  At that point in time we decided to re-stage and re-figure his interstitial lung disease.  I discussed his 18 pathology slide at our interstitial lung disease conference.  Our local pulmonary pathologist feels strongly that there is no evidence of Langerhan histiocytosis X.  Instead he has smoking-related interstitial fibrosis (SRIF) with associated emphysema.  On his latest high-resolution CT chest there is not much of a description of ILD even though he has crackles on exam.  Report as below and I personally visualized that CT.  He has a steady progression and worsening of pulmonary function test over 3 or 4 years as documented below.  However, he is not  hypoxemic.  He is continuing to use oxygen and is requesting oxygen for symptomatic relief of dyspnea.  However when he turned the oxygen off and walked him he is not hypoxemic at all.  His room air at rest pulse ox is 97% with heart rate of 88/min.  We walked him 2 laps and he stopped and his pulse ox dropped to 92% and his heart rate rose to 94%.  While he does drop his pulse ox by more than 3 points he is not going below 88%.  On the other hand he is becoming very symptomatic with severe shortness of breath dizziness and staggering.  Medical assistant notes that he is always nodding off.  We administered a [place] symptom assessment score and he has significant poly-symptom burden.  Medication review shows polypharmacy.  He is unable to quit any of these.   CT chest high resolution 08/11/2018 Lungs/Pleura: Postoperative changes of wedge resection are again noted in the left lower lobe and tracking along the left major fissure, similar to the prior study. However, compared to the prior examination there is greatly increased pleuroparenchymal thickening and architectural distortion in the mid to lower left hemithorax with extensive parenchymal banding throughout this region. These findings are markedly asymmetric compared to the contralateral side where there is only minimal linear scarring in the posterior aspect of the right lower lobe in the most dependent portion of the lung. High-resolution images otherwise demonstrate no widespread areas of ground-glass attenuation, septal thickening, traction bronchiectasis or honeycombing elsewhere in the lungs. Inspiratory and expiratory imaging demonstrates mild air trapping, most evident in the left lower lobe.  No acute consolidative airspace disease. No pleural effusions. Diffuse bronchial wall thickening with mild centrilobular and paraseptal emphysema.  IMPRESSION: 1. Although there are areas of increasing fibrosis throughout the mid to lower  lung, these findings are clearly asymmetric with the contralateral side and are favored to be related to developing areas of pleuroparenchymal scarring, potentially related to prior surgery. The pattern is not a usual pattern for any typical interstitial lung disease, and there is no involvement of the right lung. 2. Diffuse bronchial wall thickening with mild centrilobular and paraseptal emphysema; imaging findings compatible with the reported clinical history of underlying COPD. 3. Air trapping, most evident in the left lower lobe, indicative of small airways disease. 4. Aortic atherosclerosis, in addition to left main and 3 vessel coronary artery disease. Please note that although the presence of coronary artery calcium documents the presence of coronary artery disease, the severity of this disease and any potential stenosis cannot be assessed on this non-gated CT examination. Assessment for potential risk factor modification, dietary therapy or pharmacologic therapy may be warranted, if clinically indicated. 5. Severe hepatic steatosis. 6. Lipomatous hypertrophy of the interatrial septum.  Aortic Atherosclerosis (ICD10-I70.0) and Emphysema (ICD10-J43.9).   Electronically Signed   By: Kevin Langton M.D.   On: 08/12/2018 08:29    OV 05/03/2019  Subjective:  Patient ID: [name], male , DOB: April 03, 1956 , age 28 y.o. , MRN: [id number] , ADDRESS: [place] [address]   Interstitial lung disease with emphysema -surgical lung biopsy May 18, 2014 read in [place] as DIP with burned-out Langerhans' cell histiocytosis but reinterpreted February 2020 at [facility] as [facility] = smoking-related interstitial fibrosis associated with emphysema  05/03/2019 -   Chief Complaint  Patient presents with  . COPD with acute exacerbation    Feels his COPD has gotten worse since the televisit on 04/20/19    Fu mixed ILD and emphysema with chronic  respiratory failure    HPI [name] 64 y.o. -presents for follow-up.  Last seen by myself in early part of the year.  Since then has seen nurse practitioner 2 or 3 times through combination of physical visits and video/telephone visits.  1 of the times he is sent to the emergency department and admitted.  Last CT scan of the chest was in April 2020 and did not show any progression of his combination emphysema and ILD.  He had a blood gas at that time without any hypercapnia.  He tells me that he continues to live with his mom.  He uses between 6 and 8 L of oxygen and is stable.  He continues to have multiple symptoms including confusion early in the morning [he is noticed to be on a lot of pain meds] and chronic pain including pain in the right lateral chest.  In terms of his breathing he is stable.  Overall his quality of life and functional status is poor with an ECOG of 4.  Home palliative care used to visit with him but not anymore.  He wants the support again.  He is not interested in hospice.  He is asking for the shingles shot but we do not have this.  He will have his high-dose flu shot today.  Overall supportive care    Results for KADARIUS, VILLANEDA (MRN [id number]) as of 05/03/2019 10:53  Ref. Range 11/07/2018 17:17  pH, Ven Latest Ref Range: 7.250 - 7.430  7.373  pCO2, Ven Latest Ref Range: 44.0 -  60.0 mmHg 40.6 (L)  pO2, Ven Latest Ref Range: 32.0 - 45.0 mmHg 63.0 (H)    IMPRESSION: Chest:  1. New small focal area of peripheral ground-glass density in the anterior right upper lobe is likely infectious or inflammatory. 2. Unchanged pleuroparenchymal scarring and fibrosis in the mid to lower left hemithorax. 3.  Emphysema (ICD10-J43.9). 4.  Aortic atherosclerosis (ICD10-I70.0).  Abdomen and pelvis:  1.  No acute intra-abdominal process. 2. Hepatic steatosis. 3. Prior right nephrectomy. 4. Bilateral femoral head avascular necrosis.   Electronically Signed   By:  Titus Dubin M.D.   On: 11/07/2018 23:33    OV 06/08/2019  Subjective:  Patient ID: [name], male , DOB: 01/09/1956 , age 35 y.o. , MRN: [id number] , ADDRESS: [place] [address]    Interstitial lung disease with emphysema -surgical lung biopsy May 18, 2014 read in [place] as DIP with burned-out Langerhans' cell histiocytosis but reinterpreted February 2020 at [facility] as [facility] = smoking-related interstitial fibrosis associated with emphysema  06/08/2019 -   Chief Complaint  Patient presents with  . Hospitalization Follow-up    Pt in hosp 10/30-11/1.  Pt states he has had good days and bad days since being out of the hospital. Pt states he does have alot of SOB, has an occ cough due to getting choked, and has chest tightness.      Interstitial lung disease with emphysema -surgical lung biopsy May 18, 2014 read in [place] as DIP with burned-out Langerhans' cell histiocytosis but reinterpreted February 2020 at [facility] as [facility] = smoking-related interstitial fibrosis associated with emphysema  Chronic hypoxemic respiratory failure due to the above on 6 L of oxygen at baseline  Ongoing smoking  Ongoing polypharmacy and opioid use  HPI [name] 64 y.o. -last seen mid October 2020.  Then to his end of October 2020 he got admitted with sepsis elevated lactic acid.  Diagnosed to have pneumonia based on chest x-ray and then treated with antibiotics.  Discharge November 1 second 2020 on Augmentin.  He is now back to his baseline of 6 L oxygen and is beginning to feel well.  Nevertheless overall he still feels deconditioned.  As usual he says his lungs hurt.  This visit was set up mainly in the context of his recent admission.  Admission labs reviewed and in particular he seems to have developed hypercapnia at least he was hypercapnic in the hospital.  He is no longer using his CPAP.  Unclear why.  He is interested in getting this  back.  He is interested in BiPAP as well if he qualifies.  He continues to have a large symptom burden as documented below.  Last CT scan of the chest April 2020      Results for Kevin, [name] (MRN [id number]) as of 09/08/2018 11:46  Ref. Range 04/13/2014 11:43 09/07/2014 11:39 09/08/2018 10:21  FVC-Pre Latest Units: L 4.37 4.03 3.80  FVC-%Pred-Pre Latest Units: % 90 83 81   Results for Kevin, [name] (MRN [id number]) as of 09/08/2018 11:46  Ref. Range 04/13/2014 11:43 09/07/2014 11:39 09/08/2018 10:21  DLCO unc Latest Units: ml/min/mmHg 17.92 16.41 13.84  DLCO unc % pred Latest Units: % 55 50 51   Results for Kevin, [name] (MRN [id number]) as of 06/08/2019 11:39  Ref. Range 05/19/2019 15:00  pH, Arterial Latest Ref Range: 7.350 - 7.450  7.346 (L)  pCO2 arterial Latest Ref Range: 32.0 - 48.0 mmHg 52.1 (H)  pO2, Arterial Latest Ref Range: 83.0 - 108.0 mmHg 73.5 (L)    OV 10/30/2019  Subjective:  Patient ID: [name], male , DOB: 27-Jun-1956 , age 11 y.o. , MRN: [id number] , ADDRESS: [place] [id number]   10/30/2019 -   Chief Complaint  Patient presents with  . Follow-up    Pt states he believes his breathing has become worse since last visit. States when he walks, he will begin to have pain in his lungs and chest.     Interstitial lung disease with emphysema -surgical lung biopsy May 18, 2014 read in [place] as DIP with burned-out Langerhans' cell histiocytosis but reinterpreted February 2020 at [facility] as [facility] = smoking-related interstitial fibrosis associated with emphysema  Chronic hypoxemic respiratory failure due to the above on 6 L of oxygen at baseline  Ongoing smoking  Ongoing polypharmacy and opioid use   HPI [name] 64 y.o. -presents for follow-up.  After the last visit he had a work-up.  Blood gas did not show any hypercapnia.  High-resolution CT chest that showed emphysema.  No ILD.  His pulmonary function test though  shows decline.  Review of the chart indicates that he is in need of alpha-1 antitrypsin level checked.  He continues to have significant symptoms.  Symptom score is documented.  He says also for the last 1 month he is coughing discolored sputum that is a change from baseline.  The sputum is yellow in color.  He says cardiac etiology has been ruled out as reason for his dyspnea.  He is frustrated by his multiple symptomatology that includes pain in his breast and overall discomfort and exhaustion and tiredness.  We have struggled to fix all this.  He continues to have polypharmacy and smoking.  He lives with his mom.      [place] Symptom Assessment Numerical Scale 0 is no problem -> 10 worst problem 10/14/2015   04/23/2017  08/01/2018  06/08/2019  10/30/2019   No Pain -> Worst pain 0 7 9 9 7   No Tiredness -> Worset tiredness 10 9 10 10 10   No Nausea -> Worst nausea 0 0 0 3 2  No Depression -> Worst depression 7 10 5 9 9   No Anxiety -> Worst Anxiety 8 5 5 8 8   No Drowsiness -> Worst Drowsiness 10 3 (though was nodding off) 1 9 3   Best appetite-> Worst Appetitle 3 4 3 3 8   Best Feeling of well being -> Worst feeling 5 7 1 4 8   Cough   8 9 10   No dyspnea-> Worst dyspnea 8 8 10 10 10   Other problem (none -> severe) 10 - chest discomfort, when active 7 leg pain 10 10 chest discomfort and other miscellaneous   Completed by  patient patient patient patient           Results for Kevin, [name] (MRN [id number]) as of 10/30/2019 09:44  Ref. Range 06/12/2019 13:50  FIO2 Unknown 44.00  pH, Arterial Latest Ref Range: 7.350 - 7.450  7.410  pCO2 arterial Latest Ref Range: 32.0 - 48.0 mmHg 45.0  pO2, Arterial Latest Ref Range: 83.0 - 108.0 mmHg 93.1   PFT   Results for Kevin, [name] (MRN [id number]) as of 10/30/2019 09:44  Ref. Range 04/13/2014 11:43 09/07/2014 11:39 09/08/2018 10:21 09/04/2019 11:01  FVC-Pre Latest Units: L 4.37 4.03 3.80 3.47  FVC-%Pred-Pre Latest Units: % 90 83 81 74  Results for  [name], [name] (  MRN [id number]) as of 10/30/2019 09:44  Ref. Range 04/13/2014 11:43 09/07/2014 11:39 09/08/2018 10:21 09/04/2019 11:01  DLCO unc Latest Units: ml/min/mmHg 17.92 16.41 13.84 14.15  DLCO unc % pred Latest Units: % 55 50 51 52   ROS - per HPI  IMPRESSION: 1. Negative for interstitial lung disease. 2. Interval clearing of previously seen subpleural ground-glass in the anterior segment right upper lobe. 3. Aortic atherosclerosis (ICD10-I70.0). Coronary artery calcification. 4.  Emphysema (ICD10-J43.9).   Electronically Signed   By: Lorin Picket M.D.   On: 08/30/2019 16:20   has a past medical history of Anxiety, Arthritis, Cholecystitis, acute (12/20/2013), Cocaine abuse ([place]), COPD (chronic obstructive pulmonary disease) ([place]), Coronary atherosclerosis of native coronary artery, Depression, Essential hypertension, benign, GERD (gastroesophageal reflux disease), Headache, Hepatitis (Late 1970s), History of nephrolithiasis, History of pneumonia, History of stroke, Hyperlipidemia, Noncompliance, Renal cell carcinoma ([place]), Sleep apnea, and Type 2 diabetes mellitus ([place]) (2011).   reports that he has been smoking cigarettes. He started smoking about 47 years ago. He has a 41.00 pack-year smoking history. He has never used smokeless tobacco.  Past Surgical History:  Procedure Laterality Date  . APPENDECTOMY  1970's  . CHOLECYSTECTOMY N/A 12/22/2013   Procedure: LAPAROSCOPIC CHOLECYSTECTOMY ;  Surgeon: [name], MD;  Location: [place];  Service: General;  Laterality: N/A;  . ENDARTERECTOMY Right 10/08/2014   Procedure: Right ENDARTERECTOMY CAROTID;  Surgeon: [name], MD;  Location: [place];  Service: Vascular;  Laterality: Right;  . LAPAROSCOPIC LYSIS OF ADHESIONS  02/21/2014   Procedure: LAPAROSCOPIC LYSIS OF ADHESIONS EXTINSIVE;  Surgeon: [name], MD;  [facility];  Service: Urology;;  . LEFT HEART CATHETERIZATION WITH CORONARY ANGIOGRAM N/A 06/02/2012    Procedure: LEFT HEART CATHETERIZATION WITH CORONARY ANGIOGRAM;  Surgeon: [name], MD;  Location: [facility] CATH LAB;  Service: Cardiovascular;  Laterality: N/A;  . LEFT HEART CATHETERIZATION WITH CORONARY ANGIOGRAM N/A 03/09/2013   Procedure: LEFT HEART CATHETERIZATION WITH CORONARY ANGIOGRAM;  Surgeon: [name], MD;  Location: [facility] CATH LAB;  Service: Cardiovascular;  Laterality: N/A;  . LEFT HEART CATHETERIZATION WITH CORONARY ANGIOGRAM N/A 12/20/2013   Procedure: LEFT HEART CATHETERIZATION WITH CORONARY ANGIOGRAM;  Surgeon: [name], MD;  Location: [facility] CATH LAB;  Service: Cardiovascular;  Laterality: N/A;  . LUNG BIOPSY Left 05/18/2014   Procedure: LUNG BIOPSY left upper lobe & left lower lobe;  Surgeon: [name], MD;  Location: [place];  Service: Thoracic;  Laterality: Left;  . PATCH ANGIOPLASTY Right 10/08/2014   Procedure: PATCH ANGIOPLASTY Right Carotid;  Surgeon: [name], MD;  Location: [place];  Service: Vascular;  Laterality: Right;  . ROBOT ASSISTED LAPAROSCOPIC NEPHRECTOMY Right 02/21/2014   Procedure: ROBOTIC ASSISTED LAPAROSCOPIC RIGHT NEPHRECTOMY ;  Surgeon: [name], MD;  [facility];  Service: Urology;  Laterality: Right;  [name] VIDEO ASSISTED THORACOSCOPY Left 05/18/2014   Procedure: LEFT VIDEO ASSISTED THORACOSCOPY;  Surgeon: [name], MD;  Location: [place];  Service: Thoracic;  Laterality: Left;  [name] VIDEO BRONCHOSCOPY  05/18/2014   Procedure: VIDEO BRONCHOSCOPY;  Surgeon: [name], MD;  Location: [place];  Service: Thoracic;;    Allergies  Allergen Reactions  . Oxycodone Itching and Nausea Only    Pt is able to tolerate if taken with benadryl    Immunization History  Administered Date(s) Administered  . Fluad Quad(high Dose 65+) 05/03/2019  . Influenza Split 04/04/2014, 03/27/2015  . Pneumococcal Polysaccharide-23 11/04/2012    Family History  Problem  Relation Age of Onset  . Cancer Mother        Thyroid -  living in Kevin 70's.  [name] Hypertension Other   . Diabetes Other   . Stroke Other   . Lung cancer Father   . CAD Father   . Cancer Maternal Grandmother        Breast  . Cancer Maternal Grandfather        Throat and stomach  . CAD Brother      Current Outpatient Medications:  .  ALPRAZolam (XANAX) 0.5 MG tablet, Take 1 tablet (0.5 mg total) by mouth 3 (three) times daily as needed for anxiety., Disp: , Rfl: 0 .  atorvastatin (LIPITOR) 20 MG tablet, Take 20 mg by mouth at bedtime., Disp: , Rfl: 1 .  clopidogrel (PLAVIX) 75 MG tablet, TAKE ONE TABLET BY MOUTH DAILY, Disp: 90 tablet, Rfl: 3 .  diltiazem (CARDIZEM CD) 180 MG 24 hr capsule, Take 1 capsule (180 mg total) by mouth daily., Disp: 90 capsule, Rfl: 2 .  docusate sodium (COLACE) 100 MG capsule, Take 200 mg by mouth at bedtime. , Disp: , Rfl:  .  esomeprazole (NEXIUM) 40 MG capsule, Take 40 mg by mouth daily at 12 noon. Pt only take as needed, Disp: , Rfl:  .  FLUoxetine (PROZAC) 10 MG capsule, Take 10 mg by mouth every morning. Take with 40 mg capsule = 50 mg total, Disp: , Rfl: 0 .  FLUoxetine (PROZAC) 40 MG capsule, Take 40 mg by mouth daily. Take with 10 mg capsule = 50 mg total, Disp: , Rfl:  .  gabapentin (NEURONTIN) 300 MG capsule, Take 900 mg by mouth 2 (two) times daily. , Disp: , Rfl:  .  Glycopyrrolate-Formoterol (BEVESPI AEROSPHERE) 9-4.8 MCG/ACT AERO, Inhale 2 puffs into the lungs 2 (two) times daily., Disp: 1 Inhaler, Rfl: 11 .  insulin aspart (NOVOLOG) 100 UNIT/ML injection, Inject 30 Units into the skin 3 (three) times daily with meals. , Disp: , Rfl:  .  isosorbide mononitrate (IMDUR) 60 MG 24 hr tablet, TAKE ONE TABLET BY MOUTH DAILY, Disp: 90 tablet, Rfl: 1 .  LANTUS SOLOSTAR 100 UNIT/ML Solostar Pen, Inject 115 Units into the skin 2 (two) times daily. , Disp: , Rfl: 4 .  linagliptin (TRADJENTA) 5 MG TABS tablet, Take 5 mg by mouth daily., Disp: , Rfl:  .  LORazepam (ATIVAN) 1 MG tablet, Take 0.5 mg by mouth at bedtime.  , Disp: , Rfl:  .  nitroGLYCERIN (NITROSTAT) 0.4 MG SL tablet, Place 1 tablet (0.4 mg total) under the tongue every 5 (five) minutes as needed for chest pain. IF 3 TABLETS ARE NECESSARY CALL 911, Disp: 25 tablet, Rfl: 3 .  oxybutynin (DITROPAN-XL) 10 MG 24 hr tablet, Take 10 mg by mouth daily., Disp: , Rfl: 11 .  oxyCODONE-acetaminophen (PERCOCET) 10-325 MG tablet, Take 1 tablet by mouth every 6 (six) hours as needed for pain., Disp: 30 tablet, Rfl: 0 .  potassium chloride SA (K-DUR,KLOR-CON) 20 MEQ tablet, Take 20 mEq by mouth 2 (two) times daily. , Disp: , Rfl:  .  pregabalin (LYRICA) 25 MG capsule, Take 25 mg by mouth 2 (two) times daily., Disp: , Rfl:  .  Probiotic Product (PROBIOTIC PO), Take 1 capsule by mouth daily., Disp: , Rfl:  .  tamsulosin (FLOMAX) 0.4 MG CAPS capsule, Take 0.4 mg by mouth daily. , Disp: , Rfl:  .  torsemide (DEMADEX) 20 MG tablet, TAKE THREE TABLETS BY MOUTH TWICE DAILY, Disp:  620 tablet, Rfl: 1 .  zolpidem (AMBIEN) 5 MG tablet, Take 5 mg by mouth at bedtime as needed. for insomnia, Disp: , Rfl:       Objective:   Vitals:   10/30/19 0923  BP: 124/74  Pulse: 92  SpO2: 96%  Weight: 236 lb 3.2 oz (107.1 kg)  Height: 5\' 9"  (1.753 m)    Estimated body mass index is 34.88 kg/m as calculated from the following:   Height as of this encounter: 5\' 9"  (1.753 m).   Weight as of this encounter: 236 lb 3.2 oz (107.1 kg).  @WEIGHTCHANGE @  [employer]   10/30/19 0923  Weight: 236 lb 3.2 oz (107.1 kg)     Physical Exam Deconditioned flat affect male.  Oxygen on.  Has upper airway transmitted wheeze throughout his chest fields.  No crackles.  No edema no cyanosis alert and oriented x3.  Flat affect         Assessment:       ICD-10-CM   1. COPD with acute exacerbation ([place])  J44.1   2. Congestion of upper airway  J98.8   3. Chronic respiratory failure with hypoxia ([name])  J96.11   4. Tobacco abuse  Z72.0        Plan:     Patient Instructions      ICD-10-CM   1. COPD with acute exacerbation ([place])  J44.1   2. Congestion of upper airway  J98.8   3. Chronic respiratory failure with hypoxia ([name])  J96.11   4. Tobacco abuse  Z72.0     On your latest CT scan there is no more evidence of interstitial lung disease/pulmonary fibrosis Instead the dominant feature is emphysema  Currently might be in a bit of COPD flareup or emphysema flareup  In addition you seem to be having upper airway wheeze which means the vocal cords need to be evaluated  Breathing test shows some decline which might be related to flareup  Overall shortness of breath is multifactorial and this includes emphysema, weight, physical deconditioning and other reasons  Plan -Check alpha-1 antitrypsin levels and phenotype in the setting of emphysema -Take cephalexin 5 mg 3 times daily x5 days - Please take prednisone 40 mg x1 day, then 30 mg x1 day, then 20 mg x1 day, then 10 mg x1 day, and then 5 mg x1 day and stop -Refer to ENT for evaluation for vocal cords -Continue oxygen as before -Continue Bevespi inhaler therapy and other nebulizers before -Refer to pulmonary rehabilitation  Follow-up -3 months or sooner if needed     SIGNATURE    Dr. Brand Males, M.D., F.C.C.P,  Pulmonary and Critical Care Medicine Staff Physician, [place] Director - Interstitial Lung Disease  Program  Pulmonary [place] at [place], [place], [address]  Pager: [phone], If no answer or between  15:00h - 7:00h: call [phone] Telephone: [phone]  9:56 AM 10/30/2019

## 2019-11-08 LAB — ALPHA-1 ANTITRYPSIN PHENOTYPE: A-1 Antitrypsin, Ser: 167 mg/dL (ref 83–199)

## 2019-11-09 NOTE — Progress Notes (Signed)
Alpha 1 is normal MM

## 2019-11-29 ENCOUNTER — Other Ambulatory Visit: Payer: Self-pay | Admitting: Cardiology

## 2019-12-06 ENCOUNTER — Encounter: Payer: Self-pay | Admitting: Cardiology

## 2019-12-06 NOTE — Progress Notes (Signed)
Cardiology Office Note  Date: 12/07/2019   ID: Kevin, [name] Jul 04, 1956, MRN [id number]  PCP:  Kevin Graff, PA-C (Inactive)  Cardiologist:  Kevin Lesches, MD Electrophysiologist:  None   Chief Complaint  Patient presents with  . Cardiac follow-up    History of Present Illness: [name] is a 64 y.o. male last seen in November 2020 by Mr. Kevin Sake NP.  He presents for a follow-up visit.  Continues to have chronic shortness of breath and oxygen requirement, had a hard time sleeping last night.  He states that when he gets up in the morning he feels somewhat disoriented.  He continues Pulmonary follow-up with Dr. Chase [name], I reviewed his note from April.  He has chronic hypoxic respiratory failure in the setting of emphysematous lung disease as well as some degree of pulmonary fibrosis.  His most recent chest CT in February was more consistent with emphysema rather than interstitial lung disease.  I reviewed his medications which are outlined below and stable from a cardiac perspective.  His weight is also been stable.  Past Medical History:  Diagnosis Date  . Anxiety   . Arthritis   . Cholecystitis, acute 12/20/2013   Lap chole 6/5  . Cocaine abuse ([place])   . COPD (chronic obstructive pulmonary disease) ([place])   . Coronary atherosclerosis of native coronary artery    a. 03/09/2013 Cath/PCI: LM nl, LAD: 50p, 72m (2.5x16 promus DES), LCX nl, OM1 min irregs, LPL/LPDA diff dzs, RCA nondom, mod diff dzs, EF 55%.  . Depression   . Essential hypertension   . GERD (gastroesophageal reflux disease)   . Headache   . Hepatitis Late 1970s  . History of nephrolithiasis   . History of pneumonia   . History of stroke    Right MCA distribution, residual left-sided weakness  . Hyperlipidemia   . Noncompliance   . Renal cell carcinoma [facility])    Status post radical right nephrectomy August 2015  . Sleep apnea    On CPAP, 4L O2 no cpap at home yet  . Type 2 diabetes mellitus  ([place]) 2011    Past Surgical History:  Procedure Laterality Date  . APPENDECTOMY  1970's  . CHOLECYSTECTOMY N/A 12/22/2013   Procedure: LAPAROSCOPIC CHOLECYSTECTOMY ;  Surgeon: [place] Bowie, MD;  Location: [place];  Service: General;  Laterality: N/A;  . ENDARTERECTOMY Right 10/08/2014   Procedure: Right ENDARTERECTOMY CAROTID;  Surgeon: [place] Posner, MD;  Location: [place];  Service: Vascular;  Laterality: Right;  . LAPAROSCOPIC LYSIS OF ADHESIONS  02/21/2014   Procedure: LAPAROSCOPIC LYSIS OF ADHESIONS EXTINSIVE;  Surgeon: [place] Frock, MD;  [facility];  Service: Urology;;  . LEFT HEART CATHETERIZATION WITH CORONARY ANGIOGRAM N/A 06/02/2012   Procedure: LEFT HEART CATHETERIZATION WITH CORONARY ANGIOGRAM;  Surgeon: [place] Bow, MD;  Location: [facility] CATH LAB;  Service: Cardiovascular;  Laterality: N/A;  . LEFT HEART CATHETERIZATION WITH CORONARY ANGIOGRAM N/A 03/09/2013   Procedure: LEFT HEART CATHETERIZATION WITH CORONARY ANGIOGRAM;  Surgeon: [place] Mocha, MD;  Location: [facility] CATH LAB;  Service: Cardiovascular;  Laterality: N/A;  . LEFT HEART CATHETERIZATION WITH CORONARY ANGIOGRAM N/A 12/20/2013   Procedure: LEFT HEART CATHETERIZATION WITH CORONARY ANGIOGRAM;  Surgeon: [place] Blanks, MD;  Location: [facility] CATH LAB;  Service: Cardiovascular;  Laterality: N/A;  . LUNG BIOPSY Left 05/18/2014   Procedure: LUNG BIOPSY left upper lobe & left lower lobe;  Surgeon: [place] Nakayama, MD;  Location: [place];  Service: Thoracic;  Laterality: Left;  . PATCH ANGIOPLASTY Right 10/08/2014   Procedure: PATCH ANGIOPLASTY Right Carotid;  Surgeon: [place] Posner, MD;  Location: [place];  Service: Vascular;  Laterality: Right;  . ROBOT ASSISTED LAPAROSCOPIC NEPHRECTOMY Right 02/21/2014   Procedure: ROBOTIC ASSISTED LAPAROSCOPIC RIGHT NEPHRECTOMY ;  Surgeon: [place] Frock, MD;  [facility];  Service: Urology;  Laterality: Right;  [name] VIDEO ASSISTED THORACOSCOPY Left 05/18/2014   Procedure: LEFT VIDEO  ASSISTED THORACOSCOPY;  Surgeon: [place] Nakayama, MD;  Location: [place];  Service: Thoracic;  Laterality: Left;  [name] VIDEO BRONCHOSCOPY  05/18/2014   Procedure: VIDEO BRONCHOSCOPY;  Surgeon: [place] Nakayama, MD;  Location: [place];  Service: Thoracic;;    Current Outpatient Medications  Medication Sig Dispense Refill  . ALPRAZolam (XANAX) 0.5 MG tablet Take 1 tablet (0.5 mg total) by mouth 3 (three) times daily as needed for anxiety.  0  . atorvastatin (LIPITOR) 20 MG tablet Take 20 mg by mouth at bedtime.  1  . cephALEXin (KEFLEX) 500 MG capsule Take 1 capsule (500 mg total) by mouth 3 (three) times daily. 15 capsule 0  . clopidogrel (PLAVIX) 75 MG tablet TAKE ONE TABLET BY MOUTH DAILY 90 tablet 3  . diltiazem (CARDIZEM CD) 180 MG 24 hr capsule Take 1 capsule (180 mg total) by mouth daily. 90 capsule 2  . docusate sodium (COLACE) 100 MG capsule Take 200 mg by mouth at bedtime.     [name] esomeprazole (NEXIUM) 40 MG capsule Take 40 mg by mouth daily at 12 noon. Pt only take as needed    . FLUoxetine (PROZAC) 10 MG capsule Take 10 mg by mouth every morning. Take with 40 mg capsule = 50 mg total  0  . FLUoxetine (PROZAC) 40 MG capsule Take 40 mg by mouth daily. Take with 10 mg capsule = 50 mg total    . gabapentin (NEURONTIN) 300 MG capsule Take 900 mg by mouth 2 (two) times daily.     . Glycopyrrolate-Formoterol (BEVESPI AEROSPHERE) 9-4.8 MCG/ACT AERO Inhale 2 puffs into the lungs 2 (two) times daily. 1 Inhaler 11  . insulin aspart (NOVOLOG) 100 UNIT/ML injection Inject 30 Units into the skin 3 (three) times daily with meals.     . isosorbide mononitrate (IMDUR) 60 MG 24 hr tablet TAKE ONE TABLET BY MOUTH DAILY 90 tablet 1  . LANTUS SOLOSTAR 100 UNIT/ML Solostar Pen Inject 115 Units into the skin 2 (two) times daily.   4  . linagliptin (TRADJENTA) 5 MG TABS tablet Take 5 mg by mouth daily.    [name] LORazepam (ATIVAN) 1 MG tablet Take 0.5 mg by mouth at bedtime.     . nitroGLYCERIN (NITROSTAT) 0.4  MG SL tablet Place 1 tablet (0.4 mg total) under the tongue every 5 (five) minutes as needed for chest pain. IF 3 TABLETS ARE NECESSARY CALL 911 25 tablet 3  . oxybutynin (DITROPAN-XL) 10 MG 24 hr tablet Take 10 mg by mouth daily.  11  . oxyCODONE-acetaminophen (PERCOCET) 10-325 MG tablet Take 1 tablet by mouth every 6 (six) hours as needed for pain. 30 tablet 0  . potassium chloride SA (K-DUR,KLOR-CON) 20 MEQ tablet Take 20 mEq by mouth 2 (two) times daily.     . predniSONE (DELTASONE) 10 MG tablet Take 4x1day, 3x1day, 2x1day, 1x1day, 0.5x1day then stop 11 tablet 0  . pregabalin (LYRICA) 25 MG capsule Take 25 mg by mouth 2 (two) times daily.    . Probiotic Product (PROBIOTIC PO) Take 1 capsule by  mouth daily.    . tamsulosin (FLOMAX) 0.4 MG CAPS capsule Take 0.4 mg by mouth daily.     [name] torsemide (DEMADEX) 20 MG tablet TAKE THREE TABLETS BY MOUTH TWICE DAILY 620 tablet 1  . zolpidem (AMBIEN) 5 MG tablet Take 5 mg by mouth at bedtime as needed. for insomnia     No current facility-administered medications for this visit.   Allergies:  Oxycodone   ROS:   No syncope.  Physical Exam: VS:  BP 98/60   Pulse 87   Ht 6' (1.829 m)   Wt 238 lb (108 kg)   SpO2 90%   BMI 32.28 kg/m , BMI Body mass index is 32.28 kg/m.  Wt Readings from Last 3 Encounters:  12/07/19 238 lb (108 kg)  10/30/19 236 lb 3.2 oz (107.1 kg)  06/08/19 235 lb 9.6 oz (106.9 kg)    General:  Chronically ill-appearing male, no distress, wearing oxygen via nasal cannula. HEENT: Conjunctiva and lids normal, wearing a mask. Neck: Supple, no elevated JVP or carotid bruits, no thyromegaly. Lungs: Decreased air movement with soft expiratory wheeze. Cardiac: Regular rate and rhythm, no S3 or significant systolic murmur, no pericardial rub.  Abdomen: Protuberant, bowel sounds present, no guarding or rebound. Extremities: No pitting edema, distal pulses 2+.  ECG:  An ECG dated 05/22/2019 was personally reviewed today and  demonstrated:  Sinus rhythm with low voltage, frequent PACs, some in pattern of bigeminy, probable old inferior infarct pattern with nonspecific T wave changes.  Recent Labwork: 12/23/2018: B Natriuretic Peptide 44.9 05/19/2019: ALT 14; AST 18 05/20/2019: BUN 15; Creatinine, Ser 1.32; Hemoglobin 12.0; Platelets 177; Potassium 3.7; Sodium 138   Other Studies Reviewed Today:  Cardiac catheterization 12/20/2013: Hemodynamic Findings: Central aortic pressure: 120/62 Left ventricular pressure: 127/7/15  Angiographic Findings:  Left main: Short segment. No obstructive disease.   Left Anterior Descending Artery: Moderate caliber vessel that courses to the apex. There is 40% proximal stenosis. The mid stented segment is patent without restenosis. The distal vessel has diffuse non-obstructive plaque. The diagonal branches are small in caliber with no obstructive disease.   Circumflex Artery: Large dominant system with large bifurcating first obtuse marginal branch and three small caliber posterolateral branches. The OM branch has diffuse 50% proximal stenosis, no focally obstructive lesions. The three small caliber posterolateral branches all have diffuse moderate to severe stenosis that is unchanged from last cath.   Right Coronary Artery: Small non-dominant vessel with 40% mid vessel stenosis.   Left Ventricular Angiogram: LVEF=65%  Impression: 1. Double vessel CAD with patent stent mid LAD 2. Moderate non-obstructive disease in the Circumflex and RCA 3. Normal LV systolic function  Echocardiogram 07/05/2015: Study Conclusions  - Left ventricle: The cavity size was normal. Wall thickness was normal. Systolic function was normal. The estimated ejection fraction was in the range of 60% to 65%. Doppler parameters are consistent with abnormal left ventricular relaxation (grade 1 diastolic dysfunction).  High-resolution chest CT 08/30/2019: IMPRESSION: 1. Negative for  interstitial lung disease. 2. Interval clearing of previously seen subpleural ground-glass in the anterior segment right upper lobe. 3. Aortic atherosclerosis (ICD10-I70.0). Coronary artery calcification. 4.  Emphysema (ICD10-J43.9).  Assessment and Plan:  1.  CAD with patent mid LAD stent site as of 2015 and moderate disease within the circumflex and RCA that is being managed medically.  Continue Plavix, Cardizem CD (not on beta-blocker with COPD), Imdur, and Lipitor.  Following conservatively.  2.  Severe emphysematous lung disease with some component of interstitial  lung disease, although most recent high-resolution chest CT was more consistent with emphysema.  He continues to follow with Dr. Chase [name] and has chronic hypoxic respiratory failure on continuous oxygen.  This looks to be his major limiting condition.  3.  Chronic diastolic heart failure.  His weight is stable on Demadex with potassium supplements.  No changes were made today.  Medication Adjustments/Labs and Tests Ordered: Current medicines are reviewed at length with the patient today.  Concerns regarding medicines are outlined above.   Tests Ordered: No orders of the defined types were placed in this encounter.   Medication Changes: No orders of the defined types were placed in this encounter.   Disposition:  Follow up 6 months in the [place] office.  Signed, Satira Sark, MD, [facility] 12/07/2019 9:41 AM    [place] at [place], [place], [place] [address] Phone: [phone]; Fax: [phone]

## 2019-12-07 ENCOUNTER — Other Ambulatory Visit: Payer: Self-pay

## 2019-12-07 ENCOUNTER — Ambulatory Visit (INDEPENDENT_AMBULATORY_CARE_PROVIDER_SITE_OTHER): Payer: Medicaid Other | Admitting: Cardiology

## 2019-12-07 ENCOUNTER — Encounter: Payer: Self-pay | Admitting: Cardiology

## 2019-12-07 VITALS — BP 98/60 | HR 87 | Ht 72.0 in | Wt 238.0 lb

## 2019-12-07 DIAGNOSIS — J9611 Chronic respiratory failure with hypoxia: Secondary | ICD-10-CM

## 2019-12-07 DIAGNOSIS — I25119 Atherosclerotic heart disease of native coronary artery with unspecified angina pectoris: Secondary | ICD-10-CM

## 2019-12-07 DIAGNOSIS — I5032 Chronic diastolic (congestive) heart failure: Secondary | ICD-10-CM

## 2019-12-07 NOTE — Patient Instructions (Addendum)
Medication Instructions:    Your physician recommends that you continue on your current medications as directed. Please refer to the Current Medication list given to you today.  Labwork:  NONE  Testing/Procedures:  NONE  Follow-Up:  Your physician recommends that you schedule a follow-up appointment in: 6 months (office)  Any Other Special Instructions Will Be Listed Below (If Applicable).  If you need a refill on your cardiac medications before your next appointment, please call your pharmacy. 

## 2020-01-05 ENCOUNTER — Encounter (HOSPITAL_COMMUNITY): Payer: Self-pay

## 2020-01-05 ENCOUNTER — Emergency Department (HOSPITAL_COMMUNITY)
Admission: EM | Admit: 2020-01-05 | Discharge: 2020-01-05 | Disposition: A | Discharge status: Home / Self Care | Payer: Medicaid Other | Attending: Emergency Medicine | Admitting: Emergency Medicine

## 2020-01-05 ENCOUNTER — Emergency Department (HOSPITAL_COMMUNITY): Payer: Medicaid Other

## 2020-01-05 ENCOUNTER — Other Ambulatory Visit: Payer: Self-pay

## 2020-01-05 DIAGNOSIS — F17210 Nicotine dependence, cigarettes, uncomplicated: Secondary | ICD-10-CM | POA: Diagnosis not present

## 2020-01-05 DIAGNOSIS — Z20822 Contact with and (suspected) exposure to covid-19: Secondary | ICD-10-CM | POA: Insufficient documentation

## 2020-01-05 DIAGNOSIS — Z7901 Long term (current) use of anticoagulants: Secondary | ICD-10-CM | POA: Insufficient documentation

## 2020-01-05 DIAGNOSIS — E1165 Type 2 diabetes mellitus with hyperglycemia: Secondary | ICD-10-CM | POA: Insufficient documentation

## 2020-01-05 DIAGNOSIS — J441 Chronic obstructive pulmonary disease with (acute) exacerbation: Secondary | ICD-10-CM | POA: Insufficient documentation

## 2020-01-05 DIAGNOSIS — Z79899 Other long term (current) drug therapy: Secondary | ICD-10-CM | POA: Diagnosis not present

## 2020-01-05 DIAGNOSIS — R0602 Shortness of breath: Secondary | ICD-10-CM | POA: Diagnosis present

## 2020-01-05 DIAGNOSIS — R2243 Localized swelling, mass and lump, lower limb, bilateral: Secondary | ICD-10-CM | POA: Diagnosis not present

## 2020-01-05 DIAGNOSIS — I2510 Atherosclerotic heart disease of native coronary artery without angina pectoris: Secondary | ICD-10-CM | POA: Diagnosis not present

## 2020-01-05 DIAGNOSIS — Z7951 Long term (current) use of inhaled steroids: Secondary | ICD-10-CM | POA: Diagnosis not present

## 2020-01-05 DIAGNOSIS — Z794 Long term (current) use of insulin: Secondary | ICD-10-CM | POA: Insufficient documentation

## 2020-01-05 DIAGNOSIS — J9621 Acute and chronic respiratory failure with hypoxia: Secondary | ICD-10-CM | POA: Diagnosis not present

## 2020-01-05 LAB — COMPREHENSIVE METABOLIC PANEL
ALT: 19 U/L (ref 0–44)
AST: 23 U/L (ref 15–41)
Albumin: 3.4 g/dL — ABNORMAL LOW (ref 3.5–5.0)
Alkaline Phosphatase: 92 U/L (ref 38–126)
Anion gap: 12 (ref 5–15)
BUN: 19 mg/dL (ref 8–23)
CO2: 29 mmol/L (ref 22–32)
Calcium: 9.1 mg/dL (ref 8.9–10.3)
Chloride: 92 mmol/L — ABNORMAL LOW (ref 98–111)
Creatinine, Ser: 1.74 mg/dL — ABNORMAL HIGH (ref 0.61–1.24)
GFR calc Af Amer: 47 mL/min — ABNORMAL LOW (ref >60)
GFR calc non Af Amer: 41 mL/min — ABNORMAL LOW (ref >60)
Glucose, Bld: 424 mg/dL — ABNORMAL HIGH (ref 70–99)
Potassium: 4 mmol/L (ref 3.5–5.1)
Sodium: 133 mmol/L — ABNORMAL LOW (ref 135–145)
Total Bilirubin: 0.5 mg/dL (ref 0.3–1.2)
Total Protein: 7.1 g/dL (ref 6.5–8.1)

## 2020-01-05 LAB — CBC WITH DIFFERENTIAL/PLATELET
Abs Immature Granulocytes: 0.08 10*3/uL — ABNORMAL HIGH (ref 0.00–0.07)
Basophils Absolute: 0 10*3/uL (ref 0.0–0.1)
Basophils Relative: 1 %
Eosinophils Absolute: 0.1 10*3/uL (ref 0.0–0.5)
Eosinophils Relative: 1 %
HCT: 40.7 % (ref 39.0–52.0)
Hemoglobin: 13.2 g/dL (ref 13.0–17.0)
Immature Granulocytes: 1 %
Lymphocytes Relative: 20 %
Lymphs Abs: 1.8 10*3/uL (ref 0.7–4.0)
MCH: 27.8 pg (ref 26.0–34.0)
MCHC: 32.4 g/dL (ref 30.0–36.0)
MCV: 85.9 fL (ref 80.0–100.0)
Monocytes Absolute: 0.5 10*3/uL (ref 0.1–1.0)
Monocytes Relative: 5 %
Neutro Abs: 6.4 10*3/uL (ref 1.7–7.7)
Neutrophils Relative %: 72 %
Platelets: 177 10*3/uL (ref 150–400)
RBC: 4.74 MIL/uL (ref 4.22–5.81)
RDW: 14 % (ref 11.5–15.5)
WBC: 8.9 10*3/uL (ref 4.0–10.5)
nRBC: 0 % (ref 0.0–0.2)

## 2020-01-05 LAB — URINALYSIS, ROUTINE W REFLEX MICROSCOPIC
Bacteria, UA: NONE SEEN
Bilirubin Urine: NEGATIVE
Glucose, UA: >=500 mg/dL — AB
Hgb urine dipstick: NEGATIVE
Ketones, ur: NEGATIVE mg/dL
Leukocytes,Ua: NEGATIVE
Nitrite: NEGATIVE
Protein, ur: NEGATIVE mg/dL
Specific Gravity, Urine: 1.018 (ref 1.005–1.030)
pH: 6 (ref 5.0–8.0)

## 2020-01-05 LAB — LACTIC ACID, PLASMA
Lactic Acid, Venous: 2.8 mmol/L (ref 0.5–1.9)
Lactic Acid, Venous: 1.9 mmol/L (ref 0.5–1.9)

## 2020-01-05 LAB — SARS CORONAVIRUS 2 BY RT PCR (HOSPITAL ORDER, PERFORMED IN ~~LOC~~ HOSPITAL LAB): SARS Coronavirus 2: NEGATIVE

## 2020-01-05 MED ORDER — PREDNISONE 50 MG PO TABS
60.0000 mg | ORAL_TABLET | Freq: Once | ORAL | Status: AC
  Administered 2020-01-05: 60 mg via ORAL
  Filled 2020-01-05: qty 1

## 2020-01-05 MED ORDER — AMOXICILLIN-POT CLAVULANATE 875-125 MG PO TABS
1.0000 | ORAL_TABLET | Freq: Once | ORAL | Status: AC
  Administered 2020-01-05: 1 via ORAL
  Filled 2020-01-05: qty 1

## 2020-01-05 MED ORDER — AMOXICILLIN-POT CLAVULANATE 875-125 MG PO TABS
1.0000 | ORAL_TABLET | Freq: Two times a day (BID) | ORAL | 0 refills | Status: DC
Start: 2020-01-05 — End: 2020-02-27

## 2020-01-05 MED ORDER — PREDNISONE 20 MG PO TABS
20.0000 mg | ORAL_TABLET | Freq: Two times a day (BID) | ORAL | 0 refills | Status: DC
Start: 2020-01-05 — End: 2020-02-27

## 2020-01-05 MED ORDER — SODIUM CHLORIDE 0.9% FLUSH: 3.0000 mL | Freq: Once | INTRAVENOUS | Status: DC

## 2020-01-05 NOTE — ED Provider Notes (Signed)
[facility] EMERGENCY DEPARTMENT Provider Note   CSN: [id number] Arrival date & time: 01/05/20  1437     History Chief Complaint  Patient presents with  . Shortness of Breath    [name] is a 64 y.o. male.  HPI He presents for evaluation of shortness of breath for 2 days.  He states "I always feel hot."  He is using his usual home medications including oxygen, and inhalers.  He uses oxygen by nasal cannula at 6 L.  He took nitroglycerin at home today to see if it helped his discomfort.  He is not currently having any chest pain.  He was transferred by EMS who treated him with albuterol, and Atrovent.  During transport his oxygen saturation was 93% while on the nebulizer.  No known recent sick contacts.  He is a smoker.  He denies vomiting, diarrhea, focal weakness or paresthesia.    Past Medical History:  Diagnosis Date  . Anxiety   . Arthritis   . Cholecystitis, acute 12/20/2013   Lap chole 6/5  . Cocaine abuse ([place])   . COPD (chronic obstructive pulmonary disease) ([place])   . Coronary atherosclerosis of native coronary artery    a. 03/09/2013 Cath/PCI: LM nl, LAD: 50p, 51m(2.5x16 promus DES), LCX nl, OM1 min irregs, LPL/LPDA diff dzs, RCA nondom, mod diff dzs, EF 55%.  . Depression   . Essential hypertension   . GERD (gastroesophageal reflux disease)   . Headache   . Hepatitis Late 1970s  . History of nephrolithiasis   . History of pneumonia   . History of stroke    Right MCA distribution, residual left-sided weakness  . Hyperlipidemia   . Noncompliance   . Renal cell carcinoma ([facility]    Status post radical right nephrectomy August 2015  . Sleep apnea    On CPAP, 4L O2 no cpap at home yet  . Type 2 diabetes mellitus ([place] 2011    Patient Active Problem List   Diagnosis Date Noted  . Sepsis (HO[place] 05/19/2019  . Sepsis with hypotension ([place] 12/23/2018  . Chronic respiratory failure with hypoxia ([place] 12/23/2018  . UTI symptoms 11/08/2018  . Abdominal distention  11/08/2018  . Chest pain 11/08/2018  . Hyperglycemia 11/08/2018  . AKI (acute kidney injury) ([place] 11/08/2018  . Acute metabolic encephalopathy 199/83/3825 . Polypharmacy 06/03/2018  . Diabetes mellitus due to underlying condition, uncontrolled, with hyperglycemia, with long-term current use of insulin ([place] 06/03/2018  . Community acquired pneumonia 05/31/2018  . Acute on chronic respiratory failure with hypoxia ([place] 05/31/2018  . Acute respiratory failure ([place]   . Decreased mobility and endurance 07/19/2015  . CKD (chronic kidney disease) stage 3, GFR 30-59 ml/min ([name]) 07/06/2015  . Acute on chronic respiratory failure ([place] 07/05/2015  . COPD with acute exacerbation (HF[place] 07/05/2015  . Interstitial lung disease (H[place] 04/10/2015  . Cough syncope 04/10/2015  . Dyspnea 04/10/2015  . Atypical chest pain 02/28/2015  . Syncope 02/28/2015  . Orthostatic dizziness 02/25/2015  . Right leg pain 02/25/2015  . Right calf pain 02/14/2015  . Malaise and fatigue 02/14/2015  . 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[place] 11/12/2014  . Smoking 11/12/2014  . History of stroke 10/24/2014  . Carotid disease, bilateral ([place] 10/24/2014  . Orthostatic hypotension 10/24/2014  . Obstructive sleep apnea   . HLD (hyperlipidemia)   . Acute on chronic renal failure (HF[place]   . Renal cell carcinoma ([place]   . Metabolic encephalopathy   . Depression   . Anxiety  state   . Noncompliance with medication regimen   . Carotid artery stenosis 10/08/2014  . Left sided numbness 09/26/2014  . Hyperreflexia 09/26/2014  . COLD (chronic obstructive lung disease) ([place]) 08/16/2014  . ILD (interstitial lung disease) ([place]) 08/16/2014  . Dizziness and giddiness 08/16/2014  . Other specified hypotension 08/16/2014  . Pulmonary edema   . Other emphysema ([place])   . Snoring 04/30/2014  . Fluid overload 02/22/2014  . Renal neoplasm 02/21/2014  . Preoperative cardiovascular examination 02/07/2014  . Renal mass, right  12/21/2013  . Coronary atherosclerosis of native coronary artery   . Substance abuse ([place])   . Hyperlipidemia   . Diabetes mellitus type 2, uncontrolled, with cardiovascular complications 70/62/3762  . Tobacco abuse 06/01/2012  . H/O medication noncompliance 06/01/2012  . Cocaine abuse ([place]) 06/01/2012  . Essential hypertension, benign     Past Surgical History:  Procedure Laterality Date  . APPENDECTOMY  1970's  . CHOLECYSTECTOMY N/A 12/22/2013   Procedure: LAPAROSCOPIC CHOLECYSTECTOMY ;  Surgeon: [name], MD;  Location: [place];  Service: General;  Laterality: N/A;  . ENDARTERECTOMY Right 10/08/2014   Procedure: Right ENDARTERECTOMY CAROTID;  Surgeon: [name], MD;  Location: [place];  Service: Vascular;  Laterality: Right;  . LAPAROSCOPIC LYSIS OF ADHESIONS  02/21/2014   Procedure: LAPAROSCOPIC LYSIS OF ADHESIONS EXTINSIVE;  Surgeon: [name], MD;  Location: [facility];  Service: Urology;;  . LEFT HEART CATHETERIZATION WITH CORONARY ANGIOGRAM N/A 06/02/2012   Procedure: LEFT HEART CATHETERIZATION WITH CORONARY ANGIOGRAM;  Surgeon: [name], MD;  Location: [facility] CATH LAB;  Service: Cardiovascular;  Laterality: N/A;  . LEFT HEART CATHETERIZATION WITH CORONARY ANGIOGRAM N/A 03/09/2013   Procedure: LEFT HEART CATHETERIZATION WITH CORONARY ANGIOGRAM;  Surgeon: [name], MD;  Location: [facility] CATH LAB;  Service: Cardiovascular;  Laterality: N/A;  . LEFT HEART CATHETERIZATION WITH CORONARY ANGIOGRAM N/A 12/20/2013   Procedure: LEFT HEART CATHETERIZATION WITH CORONARY ANGIOGRAM;  Surgeon: [name], MD;  Location: [facility] CATH LAB;  Service: Cardiovascular;  Laterality: N/A;  . LUNG BIOPSY Left 05/18/2014   Procedure: LUNG BIOPSY left upper lobe & left lower lobe;  Surgeon: [name], MD;  Location: [place];  Service: Thoracic;  Laterality: Left;  . PATCH ANGIOPLASTY Right 10/08/2014   Procedure: PATCH ANGIOPLASTY Right Carotid;  Surgeon: [name], MD;   Location: [place];  Service: Vascular;  Laterality: Right;  . ROBOT ASSISTED LAPAROSCOPIC NEPHRECTOMY Right 02/21/2014   Procedure: ROBOTIC ASSISTED LAPAROSCOPIC RIGHT NEPHRECTOMY ;  Surgeon: [name], MD;  Location: [facility];  Service: Urology;  Laterality: Right;  [name] VIDEO ASSISTED THORACOSCOPY Left 05/18/2014   Procedure: LEFT VIDEO ASSISTED THORACOSCOPY;  Surgeon: [name], MD;  Location: [place];  Service: Thoracic;  Laterality: Left;  [name] VIDEO BRONCHOSCOPY  05/18/2014   Procedure: VIDEO BRONCHOSCOPY;  Surgeon: [name], MD;  Location: [place];  Service: Thoracic;;       Family History  Problem Relation Age of Onset  . Cancer Mother        Thyroid - living in her 69's.  [name] Hypertension Other   . Diabetes Other   . Stroke Other   . Lung cancer Father   . CAD Father   . Cancer Maternal Grandmother        Breast  . Cancer Maternal Grandfather        Throat and stomach  . CAD Brother     Social History   Tobacco Use  .  Smoking status: Current Some Day Smoker    Packs/day: 1.00    Years: 41.00    Pack years: 41.00    Types: Cigarettes    Start date: 05/03/1972  . Smokeless tobacco: Never Used  . Tobacco comment: 2cigs per day as of 10/30/19  Vaping Use  . Vaping Use: Never used  Substance Use Topics  . Alcohol use: No    Alcohol/week: 0.0 standard drinks  . Drug use: No    Types: Cocaine    Comment: last used cocaine 11/2013. but is cocaine + this admit July 2015    Home Medications Prior to Admission medications   Medication Sig Start Date End Date Taking? Authorizing Provider  ALPRAZolam Duanne Moron) 0.5 MG tablet Take 1 tablet (0.5 mg total) by mouth 3 (three) times daily as needed for anxiety. 06/03/18   Dhungel, Flonnie Overman, MD  amoxicillin-clavulanate (AUGMENTIN) 875-125 MG tablet Take 1 tablet by mouth 2 (two) times daily. One po bid x 7 days 01/05/20   Daleen Bo, MD  atorvastatin (LIPITOR) 20 MG tablet Take 20 mg by mouth at bedtime. 04/26/18    [provider]  cephALEXin (KEFLEX) 500 MG capsule Take 1 capsule (500 mg total) by mouth 3 (three) times daily. 10/30/19   Brand Males, MD  clopidogrel (PLAVIX) 75 MG tablet TAKE ONE TABLET BY MOUTH DAILY 02/14/19   Satira Sark, MD  diltiazem (CARDIZEM CD) 180 MG 24 hr capsule Take 1 capsule (180 mg total) by mouth daily. 10/27/19   Satira Sark, MD  docusate sodium (COLACE) 100 MG capsule Take 200 mg by mouth at bedtime.     [provider]  esomeprazole (NEXIUM) 40 MG capsule Take 40 mg by mouth daily at 12 noon. Pt only take as needed    [provider]  FLUoxetine (PROZAC) 10 MG capsule Take 10 mg by mouth every morning. Take with 40 mg capsule = 50 mg total 03/01/18   [provider]  FLUoxetine (PROZAC) 40 MG capsule Take 40 mg by mouth daily. Take with 10 mg capsule = 50 mg total    [provider]  gabapentin (NEURONTIN) 300 MG capsule Take 900 mg by mouth 2 (two) times daily.     [provider]  Glycopyrrolate-Formoterol (BEVESPI AEROSPHERE) 9-4.8 MCG/ACT AERO Inhale 2 puffs into the lungs 2 (two) times daily. 03/30/18   Brand Males, MD  insulin aspart (NOVOLOG) 100 UNIT/ML injection Inject 30 Units into the skin 3 (three) times daily with meals.     [provider]  isosorbide mononitrate (IMDUR) 60 MG 24 hr tablet TAKE ONE TABLET BY MOUTH DAILY 10/09/19   Satira Sark, MD  LANTUS SOLOSTAR 100 UNIT/ML Solostar Pen Inject 115 Units into the skin 2 (two) times daily.  05/25/18   [provider]  linagliptin (TRADJENTA) 5 MG TABS tablet Take 5 mg by mouth daily.    [provider]  LORazepam (ATIVAN) 1 MG tablet Take 0.5 mg by mouth at bedtime.     [provider]  nitroGLYCERIN (NITROSTAT) 0.4 MG SL tablet Place 1 tablet (0.4 mg total) under the tongue every 5 (five) minutes as needed for chest pain. IF 3 TABLETS ARE NECESSARY CALL 911 11/29/19   Herminio Commons, MD    oxybutynin (DITROPAN-XL) 10 MG 24 hr tablet Take 10 mg by mouth daily. 02/22/18   [provider]  oxyCODONE-acetaminophen (PERCOCET) 10-325 MG tablet Take 1 tablet by mouth every 6 (six) hours as needed for  pain. 06/03/18   Dhungel, Nishant, MD  potassium chloride SA (K-DUR,KLOR-CON) 20 MEQ tablet Take 20 mEq by mouth 2 (two) times daily.     [provider]  predniSONE (DELTASONE) 20 MG tablet Take 1 tablet (20 mg total) by mouth 2 (two) times daily. 01/05/20   Daleen Bo, MD  pregabalin (LYRICA) 25 MG capsule Take 25 mg by mouth 2 (two) times daily. 02/14/19   [provider]  Probiotic Product (PROBIOTIC PO) Take 1 capsule by mouth daily.    [provider]  tamsulosin (FLOMAX) 0.4 MG CAPS capsule Take 0.4 mg by mouth daily.     [provider]  torsemide (DEMADEX) 20 MG tablet TAKE THREE TABLETS BY MOUTH TWICE DAILY 07/19/19   Satira Sark, MD  zolpidem (AMBIEN) 5 MG tablet Take 5 mg by mouth at bedtime as needed. for insomnia 03/03/19   [provider]    Allergies    Oxycodone  Review of Systems   Review of Systems  All other systems reviewed and are negative.   Physical Exam Updated Vital Signs BP 136/74   Pulse 90   Temp 98.7 F (37.1 C) (Rectal)   Resp (!) 21   Ht 6' (1.829 m)   Wt 103.9 kg   SpO2 97%   BMI 31.06 kg/m   Physical Exam Vitals and nursing note reviewed.  Constitutional:      General: He is not in acute distress.    Appearance: He is well-developed. He is obese. He is ill-appearing. He is not toxic-appearing or diaphoretic.  HENT:     Head: Normocephalic and atraumatic.     Right Ear: External ear normal.     Left Ear: External ear normal.  Eyes:     Conjunctiva/sclera: Conjunctivae normal.     Pupils: Pupils are equal, round, and reactive to light.  Neck:     Trachea: Phonation normal.  Cardiovascular:     Rate and Rhythm: Normal rate and regular rhythm.     Heart sounds: Normal heart  sounds.  Pulmonary:     Effort: Pulmonary effort is normal. No respiratory distress.     Breath sounds: No stridor. Rhonchi present.  Abdominal:     General: There is no distension.     Palpations: Abdomen is soft.     Tenderness: There is no abdominal tenderness. There is no right CVA tenderness.  Musculoskeletal:        General: Normal range of motion.     Cervical back: Normal range of motion and neck supple.     Right lower leg: Edema present.     Left lower leg: Edema present.     Comments: 2+ lower extremity edema  Skin:    General: Skin is warm.     Findings: No bruising.     Comments: Skin is moist  Neurological:     Mental Status: He is alert and oriented to person, place, and time.     Cranial Nerves: No cranial nerve deficit.     Sensory: No sensory deficit.     Motor: No abnormal muscle tone.     Coordination: Coordination normal.  Psychiatric:        Mood and Affect: Mood normal.        Behavior: Behavior normal.        Thought Content: Thought content normal.        Judgment: Judgment normal.     ED Results / Procedures / Treatments   Labs (  all labs ordered are listed, but only abnormal results are displayed) Labs Reviewed  LACTIC ACID, PLASMA - Abnormal; Notable for the following components:      Result Value   Lactic Acid, Venous 2.8 (*)    All other components within normal limits  COMPREHENSIVE METABOLIC PANEL - Abnormal; Notable for the following components:   Sodium 133 (*)    Chloride 92 (*)    Glucose, Bld 424 (*)    Creatinine, Ser 1.74 (*)    Albumin 3.4 (*)    GFR calc non Af Amer 41 (*)    GFR calc Af Amer 47 (*)    All other components within normal limits  CBC WITH DIFFERENTIAL/PLATELET - Abnormal; Notable for the following components:   Abs Immature Granulocytes 0.08 (*)    All other components within normal limits  URINALYSIS, ROUTINE W REFLEX MICROSCOPIC - Abnormal; Notable for the following components:   Glucose, UA >=500 (*)     All other components within normal limits  SARS CORONAVIRUS 2 BY RT PCR (HOSPITAL ORDER, [facility])  LACTIC ACID, PLASMA    EKG None  Radiology DG Chest Portable 1 View  Result Date: 01/05/2020 CLINICAL DATA:  Two weeks of shortness of breath, negative COVID-19 testing recently EXAM: PORTABLE CHEST 1 VIEW COMPARISON:  CT 08/30/2019, radiograph 05/19/2019 FINDINGS: Chronic left pleural thickening and architectural distortion mid to lower lung with suture material seen at the mid lung. Overall appearance is similar to comparison CT accounting for differences in technique. There are however some developing interstitial opacities which are increased from prior which could reflect some mild interstitial edema or early infection on a background of emphysematous changes. No pneumothorax or discernible effusion. Prominent cardiac silhouette is at least partially attributable to the portable technique. The aorta is calcified. The remaining cardiomediastinal contours are unremarkable. No acute osseous or soft tissue abnormality. Degenerative changes are present in the imaged spine and shoulders. Telemetry leads overlie the chest. IMPRESSION: 1. Chronic left pleural thickening, scarring and architectural distortion. 2. Developing interstitial opacities which could reflect some mild interstitial edema or early infection on a background of emphysematous changes. 3.  Aortic Atherosclerosis (ICD10-I70.0). Electronically Signed   By: Lovena Le M.D.   On: 01/05/2020 15:31    Procedures Procedures (including critical care time)  Medications Ordered in ED Medications  sodium chloride flush (NS) 0.9 % injection 3 mL (has no administration in time range)  amoxicillin-clavulanate (AUGMENTIN) 875-125 MG per tablet 1 tablet (has no administration in time range)  predniSONE (DELTASONE) tablet 60 mg (has no administration in time range)    ED Course  I have reviewed the triage vital  signs and the nursing notes.  Pertinent labs & imaging results that were available during my care of the patient were reviewed by me and considered in my medical decision making (see chart for details).  Clinical Course as of Jan 04 2017  Fri Jan 05, 2020  1529 Cardiomegaly, left lower lobe infiltrate, no CHF; interpreted by me  DG Chest Portable 1 View [EW]  2004 Normal  Lactic acid, plasma [EW]  2004 Normal  SARS Coronavirus 2 by RT PCR (hospital order, performed in [facility] hospital lab) Nasopharyngeal Nasopharyngeal Swab [EW]  2004 Normal except sodium high, chloride low, glucose high, creatinine high, albumin low  Comprehensive metabolic panel(!) [EW]  4818 Normal except glucose present  Urinalysis, Routine w reflex microscopic(!) [EW]  2005 Normal  CBC with Differential(!) [EW]  2005  Per radiologist, nonspecific interstitial infiltrates.  DG Chest Portable 1 View [EW]    Clinical Course User Index [EW] Daleen Bo, MD   MDM Rules/Calculators/A&P                           Patient Vitals for the past 24 hrs:  BP Temp Temp src Pulse Resp SpO2 Height Weight  01/05/20 2000 136/74 -- -- 90 (!) 21 97 % -- --  01/05/20 1538 -- 98.7 F (37.1 C) Rectal -- -- -- -- --  01/05/20 1444 (!) 153/73 98.1 F (36.7 C) Oral (!) 106 (!) 25 95 % -- --  01/05/20 1443 -- -- -- -- -- -- 6' (1.829 m) 103.9 kg    8:16 PM Reevaluation with update and discussion. After initial assessment and treatment, an updated evaluation reveals he is comfortable has no further complaints at this time.  Findings discussed and questions answered. Daleen Bo   Medical Decision Making:  This patient is presenting for evaluation of wheezing and shortness of breath, which does require a range of treatment options, and is a complaint that involves a high risk of morbidity and mortality. The differential diagnoses include pneumonia, COPD exacerbation, acute illness. I decided to review old records, and in  summary patient with known COPD, managed at home with inhalers and oxygen.  He continues to smoke..  I did not require additional historical information from anyone.  Clinical Laboratory Tests Ordered, included CBC, Metabolic panel, Urinalysis and Covid testing. Review indicates CBC normal.  Urinalysis only glucose rest normal.  C-Met with elevated glucose and creatinine from baseline with mildly depressed sodium and chloride.. Radiologic Tests Ordered, included chest x-ray.  I independently Visualized: Radiograph images, which show nonspecific interstitial infiltrates  Cardiac Monitor Tracing which shows normal sinus rhythm   Critical Interventions-clinical evaluation, laboratory testing, radiography, observation, initiation of antibiotic and steroid therapy, reassessment  After These Interventions, the Patient was reevaluated and was found stable for discharge, suspected COPD exacerbation, without urosepsis.  CRITICAL CARE-no Performed by: Daleen Bo  Nursing Notes Reviewed/ Care Coordinated Applicable Imaging Reviewed Interpretation of Laboratory Data incorporated into ED treatment  The patient appears reasonably screened and/or stabilized for discharge and I doubt any other medical condition or other [facility] requiring further screening, evaluation, or treatment in the ED at this time prior to discharge.  Plan: Home Medications-continue usual; Home Treatments-avoid smoking; return here if the recommended treatment, does not improve the symptoms; Recommended follow up-PCP checkup next week and as needed     Final Clinical Impression(s) / ED Diagnoses Final diagnoses:  COPD exacerbation ([place])    Rx / DC Orders ED Discharge Orders         Ordered    predniSONE (DELTASONE) 20 MG tablet  2 times daily     Discontinue     01/05/20 2018    amoxicillin-clavulanate (AUGMENTIN) 875-125 MG tablet  2 times daily     Discontinue     01/05/20 2018           Daleen Bo,  MD 01/07/20 1039

## 2020-01-05 NOTE — ED Triage Notes (Signed)
EMS reports pt c/o sob x 2 days.  Reports saw pcp and instructed to go to er for questionable pneumonia and dehydration.  Reports pt didn't want to go to the hospital.  EMS says pt wheezing, sob with exertion.  Pt on home o2 at 6liters continuously.  Pt also c/o chest pain and took 2 nitro at home pta.  Denies any cp at present.  EMS gave 2 albuterol and  1 atrovent neb pta without relief.  O2 sat 93% on neb and on o2 at 6 liters.  EMS placed 18g in left forearm and cbg 400's.

## 2020-01-05 NOTE — ED Notes (Signed)
Date and time results received: 01/05/20 1500 (use smartphrase ".now" to insert current time)  Test: lactic acid Critical Value: 2.8  Name of Provider Notified: Dr. Eulis Foster  Orders Received? Or Actions Taken?: n/a

## 2020-01-05 NOTE — Discharge Instructions (Signed)
Continue using your usual medicines.  Make sure you are getting plenty of rest and drinking a lot of fluids.  Start the prescriptions which were sent to your pharmacy, tomorrow morning.

## 2020-01-09 ENCOUNTER — Other Ambulatory Visit: Payer: Self-pay | Admitting: Cardiology

## 2020-01-23 ENCOUNTER — Other Ambulatory Visit: Payer: Self-pay | Admitting: Cardiology

## 2020-02-06 ENCOUNTER — Telehealth: Payer: Self-pay | Admitting: Internal Medicine

## 2020-02-06 DIAGNOSIS — J449 Chronic obstructive pulmonary disease, unspecified: Secondary | ICD-10-CM

## 2020-02-06 NOTE — Telephone Encounter (Signed)
Yes that is fine

## 2020-02-06 NOTE — Telephone Encounter (Signed)
Patient has had nebulizer for 6+ years and would like a new one threw [facility].  MR are you okay with [place] placing the order?

## 2020-02-07 NOTE — Telephone Encounter (Signed)
Order sent and pt aware 

## 2020-02-22 ENCOUNTER — Other Ambulatory Visit: Payer: Self-pay | Admitting: Cardiology

## 2020-02-27 ENCOUNTER — Telehealth: Payer: Self-pay | Admitting: Adult Health

## 2020-02-27 ENCOUNTER — Other Ambulatory Visit: Payer: Self-pay | Admitting: Cardiology

## 2020-02-27 NOTE — Telephone Encounter (Signed)
Patient is returning phone call. Patient phone number is [phone].

## 2020-02-27 NOTE — Telephone Encounter (Signed)
Patient of Dr. Golden Pop with interstitial lung disease with emphysema-smoking-related interstitial fibrosis.  Ongoing smoking.  Chronic respiratory failure on home oxygen and polypharmacy with daily opioid usage Can we verify with DME if they are out of portable oxygen tanks.  With all these sx would be good to see for visit if able , if unable could do video/telemedicine visit.   Can check to see if Dr. Erin Fulling can see this evening .

## 2020-02-27 NOTE — Telephone Encounter (Signed)
Discussed case with dr dewald , with such high flow oxygen needs and unable to come into office ,high risk for decompensation, needs to go to ER for evaluation, may need hospitalization.

## 2020-02-27 NOTE — Telephone Encounter (Signed)
Called and spoke with pt who stated he began to have increased SOB 4 days ago. Pt states he is currently using 8L of O2 which he stated he bumped it from 6L to the 8L 2 days ago due to the worsening SOB.  Pt states his O2 sats are ranging at 94% on the 8L but states when he was on 6L, sats were ranging 89-90%  Pt does have complaints of chest tightness as he states that it feels like someone is sitting on his chest. Pt states that his POC stopped working and DME does not have another one to replace his current one.  Pt stated that DME did not bring him any small O2 tanks as he was told that they did not have any small tanks. Pt states he does have the large concentrator that he has been using but due to not having any of the small tanks or working POC, he has not been able to go out of the house.  Pt also has increased fluid even with taking the torsemide. Pt states he takes 3 torsemide twice daily.  Pt also has complaints of coughing and states he will occ cough up some mucus occ yellow tinged. Pt states when he starts coughing, it takes him awhile before he is able to stop coughing. Pt also states that he has been wheezing. Pt denies any complaints of fever.  Pt is using his bevespi as prescribed. Pt states that he has been using his nitroglycerin every day due to having discomfort in his chest. Pt states he has been having to use the nitro every day x1 week now.  Pt wants recommendations to help with his symptoms. Pt does have an appt scheduled with TP 8/19.  Tammy, please advise.

## 2020-02-27 NOTE — Telephone Encounter (Signed)
Pt aware still waiting on response from TP

## 2020-02-27 NOTE — Telephone Encounter (Signed)
Sp0ke with Dr. Erin Fulling who states he is happy to see this pt this afternoon. Called pt, states that his POC is broken and he has no E tanks where he could leave his home with supplemental O2. Called [employer] to check status of this as this is unacceptable.  [employer] states that patient has not ordered any tanks since 05/2019.  [employer] is putting a rush order to get 2 E tanks to patient's home so he can at least have ambulation to a doctor's office for further evaluation.  Called patient to make aware of this, but no answer.  Had to LM.  Routing back to APP- [employer] states they are putting a rush on pt's E tanks but cannot guarantee a time today when it would be delivered.  Without tanks and a time frame I cannot schedule an appointment for patient to be here.  Please advise if there's anything else we can recommend in the meantime.  Thanks!

## 2020-02-27 NOTE — Telephone Encounter (Signed)
Called and spoke with pt letting him know the info stated by TP that he needs to go to ED and he verbalized understanding. Nothing further needed.

## 2020-03-01 ENCOUNTER — Telehealth: Payer: Self-pay | Admitting: Family Medicine

## 2020-03-01 ENCOUNTER — Telehealth: Payer: Self-pay | Admitting: Adult Health

## 2020-03-01 ENCOUNTER — Ambulatory Visit: Payer: Medicaid Other | Admitting: Family Medicine

## 2020-03-01 NOTE — Telephone Encounter (Signed)
[employer] called to check on order status for E tanks. They are going to call the patient today and sort out. [name] notified by phone that a new ticket has been placed for his tanks.

## 2020-03-01 NOTE — Progress Notes (Deleted)
Cardiology Office Note  Date: 03/01/2020   ID: Kevin, [name] Sep 17, 1955, MRN [id number]  PCP:  [name], PA-C (Inactive)  Cardiologist:  [name], MD Electrophysiologist:  None   Chief Complaint: ***  History of Present Illness: [name] is a 64 y.o. male with a history of   Past Medical History:  Diagnosis Date  . Anxiety   . Arthritis   . Cholecystitis, acute 12/20/2013   Lap chole 6/5  . Cocaine abuse ([place])   . COPD (chronic obstructive pulmonary disease) ([place])   . Coronary atherosclerosis of native coronary artery    a. 03/09/2013 Cath/PCI: LM nl, LAD: 50p, 54m (2.5x16 promus DES), LCX nl, OM1 min irregs, LPL/LPDA diff dzs, RCA nondom, mod diff dzs, EF 55%.  . Depression   . Essential hypertension   . GERD (gastroesophageal reflux disease)   . Headache   . Hepatitis Late 1970s  . History of nephrolithiasis   . History of pneumonia   . History of stroke    Right MCA distribution, residual left-sided weakness  . Hyperlipidemia   . Noncompliance   . Renal cell carcinoma [facility])    Status post radical right nephrectomy August 2015  . Sleep apnea    On CPAP, 4L O2 no cpap at home yet  . Type 2 diabetes mellitus ([place]) 2011    Past Surgical History:  Procedure Laterality Date  . APPENDECTOMY  1970's  . CHOLECYSTECTOMY N/A 12/22/2013   Procedure: LAPAROSCOPIC CHOLECYSTECTOMY ;  Surgeon: [name], MD;  Location: [place];  Service: General;  Laterality: N/A;  . ENDARTERECTOMY Right 10/08/2014   Procedure: Right ENDARTERECTOMY CAROTID;  Surgeon: [name], MD;  Location: [place];  Service: Vascular;  Laterality: Right;  . LAPAROSCOPIC LYSIS OF ADHESIONS  02/21/2014   Procedure: LAPAROSCOPIC LYSIS OF ADHESIONS EXTINSIVE;  Surgeon: [name], MD;  Location: [facility];  Service: Urology;;  . LEFT HEART CATHETERIZATION WITH CORONARY ANGIOGRAM N/A 06/02/2012   Procedure: LEFT HEART CATHETERIZATION WITH CORONARY ANGIOGRAM;  Surgeon: [name], MD;  Location[facility] C[facility] LAB;  Service: Cardiovascular;  Laterality: N/A;  . LEFT HEART CATHETERIZATION WITH CORONARY ANGIOGRAM N/A 03/09/2013   Procedure: LEFT HEART CATHETERIZATION WITH CORONARY ANGIOGRAM;  Surgeon: [name], MD;  Location: [facility] CATH LAB;  Service: Cardiovascular;  Laterality: N/A;  . LEFT HEART CATHETERIZATION WITH CORONARY ANGIOGRAM N/A 12/20/2013   Procedure: LEFT HEART CATHETERIZATION WITH CORONARY ANGIOGRAM;  Surgeon: [name], MD;  Location: [facility] CATH LAB;  Service: Cardiovascular;  Laterality: N/A;  . LUNG BIOPSY Left 05/18/2014   Procedure: LUNG BIOPSY left upper lobe & left lower lobe;  Surgeon: [name], MD;  Location: [place];  Service: Thoracic;  Laterality: Left;  . PATCH ANGIOPLASTY Right 10/08/2014   Procedure: PATCH ANGIOPLASTY Right Carotid;  Surgeon: [name], MD;  Location: [place];  Service: Vascular;  Laterality: Right;  . ROBOT ASSISTED LAPAROSCOPIC NEPHRECTOMY Right 02/21/2014   Procedure: ROBOTIC ASSISTED LAPAROSCOPIC RIGHT NEPHRECTOMY ;  Surgeon: [name], MD;  Location: [facility];  Service: Urology;  Laterality: Right;  [name] VIDEO ASSISTED THORACOSCOPY Left 05/18/2014   Procedure: LEFT VIDEO ASSISTED THORACOSCOPY;  Surgeon: [name], MD;  Location: [place];  Service: Thoracic;  Laterality: Left;  [name] VIDEO BRONCHOSCOPY  05/18/2014   Procedure: VIDEO BRONCHOSCOPY;  Surgeon: [name], MD;  Location: [place];  Service: Thoracic;;    Current Outpatient Medications  Medication Sig Dispense Refill  .  torsemide (DEMADEX) 20 MG tablet TAKE THREE TABLETS BY MOUTH TWICE DAILY 540 tablet 1  . ALPRAZolam (XANAX) 0.5 MG tablet Take 1 tablet (0.5 mg total) by mouth 3 (three) times daily as needed for anxiety.  0  . atorvastatin (LIPITOR) 20 MG tablet Take 20 mg by mouth at bedtime.  1  . clopidogrel (PLAVIX) 75 MG tablet TAKE ONE TABLET BY MOUTH EVERY DAY 90 tablet 1  . diltiazem (CARDIZEM CD) 180 MG 24 hr capsule  Take 1 capsule (180 mg total) by mouth daily. 90 capsule 2  . docusate sodium (COLACE) 100 MG capsule Take 200 mg by mouth at bedtime.     [name] esomeprazole (NEXIUM) 40 MG capsule Take 40 mg by mouth daily at 12 noon. Pt only take as needed    . FLUoxetine (PROZAC) 40 MG capsule Take 40 mg by mouth daily. Take with 10 mg capsule = 50 mg total    . gabapentin (NEURONTIN) 300 MG capsule Take 900 mg by mouth 2 (two) times daily.     . Glycopyrrolate-Formoterol (BEVESPI AEROSPHERE) 9-4.8 MCG/ACT AERO Inhale 2 puffs into the lungs 2 (two) times daily. 1 Inhaler 11  . insulin aspart (NOVOLOG) 100 UNIT/ML injection Inject 30 Units into the skin 3 (three) times daily with meals.     . isosorbide mononitrate (IMDUR) 60 MG 24 hr tablet TAKE ONE TABLET BY MOUTH DAILY 90 tablet 1  . LANTUS SOLOSTAR 100 UNIT/ML Solostar Pen Inject 115 Units into the skin 2 (two) times daily.   4  . linagliptin (TRADJENTA) 5 MG TABS tablet Take 5 mg by mouth daily.    [name] LORazepam (ATIVAN) 1 MG tablet Take 0.5 mg by mouth at bedtime.     . nitroGLYCERIN (NITROSTAT) 0.4 MG SL tablet Place 1 tablet (0.4 mg total) under the tongue every 5 (five) minutes as needed for chest pain. IF 3 TABLETS ARE NECESSARY CALL 911 25 tablet 3  . oxybutynin (DITROPAN-XL) 10 MG 24 hr tablet Take 10 mg by mouth daily.  11  . oxyCODONE-acetaminophen (PERCOCET) 10-325 MG tablet Take 1 tablet by mouth every 6 (six) hours as needed for pain. 30 tablet 0  . potassium chloride SA (K-DUR,KLOR-CON) 20 MEQ tablet Take 20 mEq by mouth 2 (two) times daily.     . pregabalin (LYRICA) 25 MG capsule Take 25 mg by mouth 2 (two) times daily.    . Probiotic Product (PROBIOTIC PO) Take 1 capsule by mouth daily.    . tamsulosin (FLOMAX) 0.4 MG CAPS capsule Take 0.4 mg by mouth daily.     [name] zolpidem (AMBIEN) 5 MG tablet Take 5 mg by mouth at bedtime as needed. for insomnia     No current facility-administered medications for this visit.   Allergies:  Oxycodone   Social  History: The patient  reports that he has been smoking cigarettes. He started smoking about 47 years ago. He has a 41.00 pack-year smoking history. He has never used smokeless tobacco. He reports that he does not drink alcohol and does not use drugs.   Family History: The patient's family history includes CAD in his brother and father; Cancer in his maternal grandfather, maternal grandmother, and mother; Diabetes in an other family member; Hypertension in an other family member; Lung cancer in his father; Stroke in an other family member.   ROS:  Please see the history of present illness. Otherwise, complete review of systems is positive for {NONE DEFAULTED:18576::"none"}.  All other systems are reviewed and  negative.   Physical Exam: VS:  There were no vitals taken for this visit., BMI There is no height or weight on file to calculate BMI.  Wt Readings from Last 3 Encounters:  01/05/20 229 lb (103.9 kg)  12/07/19 238 lb (108 kg)  10/30/19 236 lb 3.2 oz (107.1 kg)    General: Patient appears comfortable at rest. HEENT: Conjunctiva and lids normal, oropharynx clear with moist mucosa. Neck: Supple, no elevated JVP or carotid bruits, no thyromegaly. Lungs: Clear to auscultation, nonlabored breathing at rest. Cardiac: Regular rate and rhythm, no S3 or significant systolic murmur, no pericardial rub. Abdomen: Soft, nontender, no hepatomegaly, bowel sounds present, no guarding or rebound. Extremities: No pitting edema, distal pulses 2+. Skin: Warm and dry. Musculoskeletal: No kyphosis. Neuropsychiatric: Alert and oriented x3, affect grossly appropriate.  ECG:  {EKG/Telemetry Strips Reviewed:[phone]}  Recent Labwork: 01/05/2020: ALT 19; AST 23; BUN 19; Creatinine, Ser 1.74; Hemoglobin 13.2; Platelets 177; Potassium 4.0; Sodium 133     Component Value Date/Time   CHOL 128 12/19/2013 0220   TRIG 152 (H) 12/19/2013 0220   HDL 31 (L) 12/19/2013 0220   CHOLHDL 4.1 12/19/2013 0220   VLDL 30  12/19/2013 0220   LDLCALC 67 12/19/2013 0220    Other Studies Reviewed Today:   Assessment and Plan:  No diagnosis found.   Medication Adjustments/Labs and Tests Ordered: Current medicines are reviewed at length with the patient today.  Concerns regarding medicines are outlined above.   Disposition: Follow-up with ***  Signed, Levell July, NP 03/01/2020 12:28 AM    [facility] at [facility] [address] [place], [place], [place] [address] Phone: [phone]; Fax: [phone]

## 2020-03-01 NOTE — Telephone Encounter (Signed)
I called and spoke with pt regarding his BP. He stated that first thing this morning it was 82/67 (8am) then at 1:30pm it was 67/54. He was in the bed lying down and isn't able to get up due to being so dizzy. He sounded sluggish on the phone. After speaking with Katina Dung, NP the pt was notified to call EMS. He agreed to do this.

## 2020-03-01 NOTE — Telephone Encounter (Signed)
Pt is not able to make his apt today due to not having his home oxygen delivered so he doesn't have portable oxygen tank -- he's been trying for 2 weeks to get a replacement and can't get it delivered.   Called stating his BP today is 67/54, states he is dizzy- no chest pain or any other symptoms    Please call [phone]

## 2020-03-07 ENCOUNTER — Emergency Department (HOSPITAL_COMMUNITY): Payer: Medicaid Other

## 2020-03-07 ENCOUNTER — Emergency Department (HOSPITAL_COMMUNITY)
Admission: EM | Admit: 2020-03-07 | Discharge: 2020-03-07 | Disposition: A | Discharge status: Home / Self Care | Payer: Medicaid Other | Attending: Emergency Medicine | Admitting: Emergency Medicine

## 2020-03-07 ENCOUNTER — Telehealth: Payer: Self-pay | Admitting: Adult Health

## 2020-03-07 ENCOUNTER — Encounter: Payer: Self-pay | Admitting: Adult Health

## 2020-03-07 ENCOUNTER — Other Ambulatory Visit: Payer: Self-pay

## 2020-03-07 ENCOUNTER — Ambulatory Visit: Payer: Medicaid Other | Attending: Internal Medicine

## 2020-03-07 ENCOUNTER — Ambulatory Visit: Payer: Medicaid Other | Admitting: Adult Health

## 2020-03-07 DIAGNOSIS — J441 Chronic obstructive pulmonary disease with (acute) exacerbation: Secondary | ICD-10-CM | POA: Diagnosis not present

## 2020-03-07 DIAGNOSIS — R0602 Shortness of breath: Secondary | ICD-10-CM | POA: Insufficient documentation

## 2020-03-07 DIAGNOSIS — R296 Repeated falls: Secondary | ICD-10-CM | POA: Diagnosis not present

## 2020-03-07 DIAGNOSIS — J9611 Chronic respiratory failure with hypoxia: Secondary | ICD-10-CM | POA: Diagnosis not present

## 2020-03-07 DIAGNOSIS — I959 Hypotension, unspecified: Secondary | ICD-10-CM | POA: Insufficient documentation

## 2020-03-07 DIAGNOSIS — Z23 Encounter for immunization: Secondary | ICD-10-CM

## 2020-03-07 DIAGNOSIS — Z5321 Procedure and treatment not carried out due to patient leaving prior to being seen by health care provider: Secondary | ICD-10-CM | POA: Diagnosis not present

## 2020-03-07 DIAGNOSIS — J849 Interstitial pulmonary disease, unspecified: Secondary | ICD-10-CM

## 2020-03-07 LAB — CBC
HCT: 41.1 % (ref 39.0–52.0)
Hemoglobin: 13.3 g/dL (ref 13.0–17.0)
MCH: 27.3 pg (ref 26.0–34.0)
MCHC: 32.4 g/dL (ref 30.0–36.0)
MCV: 84.4 fL (ref 80.0–100.0)
Platelets: 198 10*3/uL (ref 150–400)
RBC: 4.87 MIL/uL (ref 4.22–5.81)
RDW: 14 % (ref 11.5–15.5)
WBC: 9 10*3/uL (ref 4.0–10.5)
nRBC: 0 % (ref 0.0–0.2)

## 2020-03-07 LAB — BASIC METABOLIC PANEL
Anion gap: 14 (ref 5–15)
BUN: 9 mg/dL (ref 8–23)
CO2: 23 mmol/L (ref 22–32)
Calcium: 9.7 mg/dL (ref 8.9–10.3)
Chloride: 98 mmol/L (ref 98–111)
Creatinine, Ser: 1.84 mg/dL — ABNORMAL HIGH (ref 0.61–1.24)
GFR calc Af Amer: 44 mL/min — ABNORMAL LOW (ref >60)
GFR calc non Af Amer: 38 mL/min — ABNORMAL LOW (ref >60)
Glucose, Bld: 327 mg/dL — ABNORMAL HIGH (ref 70–99)
Potassium: 3.8 mmol/L (ref 3.5–5.1)
Sodium: 135 mmol/L (ref 135–145)

## 2020-03-07 LAB — TROPONIN I (HIGH SENSITIVITY): Troponin I (High Sensitivity): 9 ng/L (ref <18)

## 2020-03-07 NOTE — Assessment & Plan Note (Signed)
Prone to frequent exacerbations with emphysema with ongoing smoking.  Complicated by interstitial lung disease and chronically dependent high flow oxygen.  Patient is very deconditioned.  Has multiple comorbidities including chronic pain requiring chronic narcotics and benzos.  Now with hypotension complicated by dizziness frequent falls.  Patient is high risk for decompensation.  Will need higher level of care.  Recommended EMS transport to emergency room.  Patient declined said he will go to the emergency room but wants to go by private vehicle.  Patient advised of potential dangers and risk.  Patient's mother is here on site and agrees to take patient to emergency room.  Patient was placed from wheelchair to car.  [facility] room was contacted with report given to triage nurse.  PLAN  Go to ER for evaluation .

## 2020-03-07 NOTE — ED Notes (Signed)
Pt stated they were leaving  

## 2020-03-07 NOTE — Telephone Encounter (Signed)
Called [facility] and spoke with charge nurse, reported low bp, frequent falls, multiple medications and high flow O2 at 6L.  Advised that he declined ems transport and is arriving by car.  Advised that they have 42 waiting to go upstairs.  Patient taken to car, mother to drive to [facility] ER.

## 2020-03-07 NOTE — Patient Instructions (Signed)
Go to ER for evaluation

## 2020-03-07 NOTE — Progress Notes (Signed)
@Patient  ID: [name], male    DOB: 05/26/1956, 64 y.o.   MRN: [id number]  Chief Complaint  Patient presents with  . Follow-up    ILD     Referring provider: No ref. provider found  HPI: 64 year old male active smoker followed for interstitial lung disease with emphysema.  Surgical lung biopsy 2015 read as DIP, reinterpreted February 2020 at [facility] felt to be smoking-related interstitial fibrosis with associated emphysema.  He has chronic respiratory failure on high flow oxygen.  Medical history significant for chronic pain on chronic narcotics   TEST/EVENTS :  Lungs/Pleura: Postoperative changes of wedge resection are again noted in the left lower lobe and tracking along the left major fissure, similar to the prior study. However, compared to the prior examination there is greatly increased pleuroparenchymal thickening and architectural distortion in the mid to lower left hemithorax with extensive parenchymal banding throughout this region. These findings are markedly asymmetric compared to the contralateral side where there is only minimal linear scarring in the posterior aspect of the right lower lobe in the most dependent portion of the lung. High-resolution images otherwise demonstrate no widespread areas of ground-glass attenuation, septal thickening, traction bronchiectasis or honeycombing elsewhere in the lungs. Inspiratory and expiratory imaging demonstrates mild air trapping, most evident in the left lower lobe. No acute consolidative airspace disease. No pleural effusions. Diffuse bronchial wall thickening with mild centrilobular and paraseptal emphysema.  IMPRESSION: 1. Although there are areas of increasing fibrosis throughout the mid to lower lung, these findings are clearly asymmetric with the contralateral side and are favored to be related to developing areas of pleuroparenchymal scarring, potentially related to prior surgery. The pattern is not  a usual pattern for any typical interstitial lung disease, and there is no involvement of the right lung. 2. Diffuse bronchial wall thickening with mild centrilobular and paraseptal emphysema; imaging findings compatible with the reported clinical history of underlying COPD. 3. Air trapping, most evident in the left lower lobe, indicative of small airways disease. 4. Aortic atherosclerosis, in addition to left main and 3 vessel coronary artery disease. Please note that although the presence of coronary artery calcium documents the presence of coronary artery disease, the severity of this disease and any potential stenosis cannot be assessed on this non-gated CT examination. Assessment for potential risk factor modification, dietary therapy or pharmacologic therapy may be warranted, if clinically indicated. 5. Severe hepatic steatosis. 6. Lipomatous hypertrophy of the interatrial septum.  No obvious eval was done that thank you Opyd   03/07/2020 Follow up : ILD ,O2 RF, smoker , Emphysema  Patient presents for a 65-month follow-up.  Patient has underlying smoking-related interstitial lung disease.  He has high symptom burden with significant shortness of breath and decreased activity tolerance.  He is on chronic oxygen with high flow demands at 6 L.  He is prone to frequent COPD/emphysema exacerbations as he continues to smoke.  Patient says that he has been having increased difficulties with low blood pressures frequent falls has had a recent COPD exacerbation is currently on antibiotics and a steroid taper from his primary care provider.  Patient was seen last visit in April received antibiotics and a prednisone taper.  Was seen in the emergency room in June with another COPD exacerbation requiring treatment.  Patient has been recommended for hospice and/or palliative care in the past and has declined. He has chronic pain and is on chronic narcotic use, along with multiple sedating medications  including Xanax, lorazepam,  Neurontin, Lyrica and Ambien. Patient remains on Bevespi twice daily.   Patient says that his breathing has been doing very bad lately he is very short of breath with minimal activity and is actually short of breath at rest.  He has frequent cough and wheezing.  He has finished not prednisone but still has a lot of shortness of breath.  Patient says he is also been very dizzy has had frequent falls at home feels lightheaded often.  Today in the office blood pressure systolic is 76.  Patient says he has been having very low blood pressures sometimes in the 60s in the morning times.  Patient remains on Cardizem and Demadex. Patient denies any fever, hemoptysis, chest pain,.  Patient says he has had increased lower extremity swelling and has positive orthopnea.   Allergies  Allergen Reactions  . Oxycodone Itching and Nausea Only    Pt is able to tolerate if taken with benadryl    Immunization History  Administered Date(s) Administered  . Fluad Quad(high Dose 65+) 05/03/2019  . Influenza Split 04/04/2014, 03/27/2015  . Pneumococcal Polysaccharide-23 11/04/2012  . Zoster 02/05/2020    Past Medical History:  Diagnosis Date  . Anxiety   . Arthritis   . Cholecystitis, acute 12/20/2013   Lap chole 6/5  . Cocaine abuse ([place])   . COPD (chronic obstructive pulmonary disease) ([place])   . Coronary atherosclerosis of native coronary artery    a. 03/09/2013 Cath/PCI: LM nl, LAD: 50p, 57m (2.5x16 promus DES), LCX nl, OM1 min irregs, LPL/LPDA diff dzs, RCA nondom, mod diff dzs, EF 55%.  . Depression   . Essential hypertension   . GERD (gastroesophageal reflux disease)   . Headache   . Hepatitis Late 1970s  . History of nephrolithiasis   . History of pneumonia   . History of stroke    Right MCA distribution, residual left-sided weakness  . Hyperlipidemia   . Noncompliance   . Renal cell carcinoma [facility])    Status post radical right nephrectomy August 2015  . Sleep apnea     On CPAP, 4L O2 no cpap at home yet  . Type 2 diabetes mellitus ([place]) 2011    Tobacco History: Social History   Tobacco Use  Smoking Status Current Some Day Smoker  . Packs/day: 1.00  . Years: 41.00  . Pack years: 41.00  . Types: Cigarettes  . Start date: 05/03/1972  Smokeless Tobacco Never Used  Tobacco Comment   2-3 per day as of 03/07/2020   Ready to quit: No Counseling given: Yes Comment: 2-3 per day as of 03/07/2020   Outpatient Medications Prior to Visit  Medication Sig Dispense Refill  . ALPRAZolam (XANAX) 0.5 MG tablet Take 1 tablet (0.5 mg total) by mouth 3 (three) times daily as needed for anxiety.  0  . atorvastatin (LIPITOR) 20 MG tablet Take 20 mg by mouth at bedtime.  1  . clopidogrel (PLAVIX) 75 MG tablet TAKE ONE TABLET BY MOUTH EVERY DAY 90 tablet 1  . diltiazem (CARDIZEM CD) 180 MG 24 hr capsule Take 1 capsule (180 mg total) by mouth daily. 90 capsule 2  . docusate sodium (COLACE) 100 MG capsule Take 200 mg by mouth at bedtime.     [name] esomeprazole (NEXIUM) 40 MG capsule Take 40 mg by mouth daily at 12 noon. Pt only take as needed    . FLUoxetine (PROZAC) 40 MG capsule Take 40 mg by mouth daily. Take with 10 mg capsule = 50 mg total    .  gabapentin (NEURONTIN) 300 MG capsule Take 900 mg by mouth 2 (two) times daily.     . Glycopyrrolate-Formoterol (BEVESPI AEROSPHERE) 9-4.8 MCG/ACT AERO Inhale 2 puffs into the lungs 2 (two) times daily. 1 Inhaler 11  . insulin aspart (NOVOLOG) 100 UNIT/ML injection Inject 30 Units into the skin 3 (three) times daily with meals.     . isosorbide mononitrate (IMDUR) 60 MG 24 hr tablet TAKE ONE TABLET BY MOUTH DAILY 90 tablet 1  . LANTUS SOLOSTAR 100 UNIT/ML Solostar Pen Inject 115 Units into the skin 2 (two) times daily.   4  . linagliptin (TRADJENTA) 5 MG TABS tablet Take 5 mg by mouth daily.    [name] LORazepam (ATIVAN) 1 MG tablet Take 0.5 mg by mouth at bedtime.     . nitroGLYCERIN (NITROSTAT) 0.4 MG SL tablet Place 1 tablet (0.4  mg total) under the tongue every 5 (five) minutes as needed for chest pain. IF 3 TABLETS ARE NECESSARY CALL 911 25 tablet 3  . oxybutynin (DITROPAN-XL) 10 MG 24 hr tablet Take 10 mg by mouth daily.  11  . oxyCODONE-acetaminophen (PERCOCET) 10-325 MG tablet Take 1 tablet by mouth every 6 (six) hours as needed for pain. 30 tablet 0  . potassium chloride SA (K-DUR,KLOR-CON) 20 MEQ tablet Take 20 mEq by mouth 2 (two) times daily.     . pregabalin (LYRICA) 25 MG capsule Take 25 mg by mouth 2 (two) times daily.    . Probiotic Product (PROBIOTIC PO) Take 1 capsule by mouth daily.    . tamsulosin (FLOMAX) 0.4 MG CAPS capsule Take 0.4 mg by mouth daily.     [name] torsemide (DEMADEX) 20 MG tablet TAKE THREE TABLETS BY MOUTH TWICE DAILY 540 tablet 1  . zolpidem (AMBIEN) 5 MG tablet Take 5 mg by mouth at bedtime as needed. for insomnia     No facility-administered medications prior to visit.     Review of Systems:   Constitutional:   No  weight loss, night sweats,  Fevers, chills, + fatigue, or  lassitude.  HEENT:   No headaches,  Difficulty swallowing,  Tooth/dental problems, or  Sore throat,                No sneezing, itching, ear ache,  +nasal congestion, post nasal drip,   CV:  No chest pain,  Orthopnea, PND, swelling in lower extremities, anasarca, dizziness, palpitations, syncope.   GI  No heartburn, indigestion, abdominal pain, nausea, vomiting, diarrhea, change in bowel habits, loss of appetite, bloody stools.   Resp: ++ shortness of breath with exertion or at rest.  ++ excess mucus, no productive cough,  No non-productive cough,  No coughing up of blood.  ++ change in color of mucus. ++ wheezing.  No chest wall deformity  Skin: no rash or lesions.  GU: no dysuria, change in color of urine, no urgency or frequency.  No flank pain, no hematuria   MS:  No joint pain or swelling.  No decreased range of motion.  No back pain.    Physical Exam  BP (!) 76/50 (BP Location: Left Arm, Cuff  Size: Normal)   Pulse 87   Temp 98.1 F (36.7 C) (Temporal)   Ht 5\' 9"  (1.753 m)   Wt 237 lb 3.2 oz (107.6 kg)   SpO2 97%   BMI 35.03 kg/m   GEN: A/Ox3; pleasant , chronically ill-appearing male in wheelchair on oxygen Blood pressure recheck 76/50   HEENT:  [place]/AT,   NOSE-clear, THROAT-clear,  no lesions, no postnasal drip or exudate noted.   NECK:  Supple w/ fair ROM; no JVD; normal carotid impulses w/o bruits; no thyromegaly or nodules palpated; no lymphadenopathy.    RESP scattered rhonchi with expiratory wheezes  no accessory muscle use, no dullness to percussion  CARD:  RRR, no m/r/g, 1+ peripheral edema, pulses intact, no cyanosis or clubbing.  GI:   Soft & nt; nml bowel sounds; no organomegaly or masses detected.   Musco: Warm bil, no deformities or joint swelling noted.   Neuro: alert, no focal deficits noted.    Skin: Warm, no lesions or rashes    Lab Results:  CBC  Imaging: No results found.    PFT Results Latest Ref Rng & Units 09/04/2019 09/08/2018 09/07/2014 04/13/2014  FVC-Pre L 3.47 3.80 4.03 4.37  FVC-Predicted Pre % 74 81 83 90  FVC-Post L - - 4.29 4.43  FVC-Predicted Post % - - 89 92  Pre FEV1/FVC % % 77 76 81 82  Post FEV1/FCV % % - - 83 83  FEV1-Pre L 2.68 2.88 3.28 3.58  FEV1-Predicted Pre % 77 82 90 97  FEV1-Post L - - 3.58 3.67  DLCO uncorrected ml/min/mmHg 14.15 13.84 16.41 17.92  DLCO UNC% % 52 51 50 55  DLVA Predicted % 71 62 57 63  TLC L - - 6.36 6.11  TLC % Predicted % - - 90 87  RV % Predicted % - - 85 76    No results found for: NITRICOXIDE      Assessment & Plan:   COLD (chronic obstructive lung disease) ([name]) Prone to frequent exacerbations with emphysema with ongoing smoking.  Complicated by interstitial lung disease and chronically dependent high flow oxygen.  Patient is very deconditioned.  Has multiple comorbidities including chronic pain requiring chronic narcotics and benzos.  Now with hypotension complicated by  dizziness frequent falls.  Patient is high risk for decompensation.  Will need higher level of care.  Recommended EMS transport to emergency room.  Patient declined said he will go to the emergency room but wants to go by private vehicle.  Patient advised of potential dangers and risk.  Patient's mother is here on site and agrees to take patient to emergency room.  Patient was placed from wheelchair to car.  [facility] room was contacted with report given to triage nurse.  PLAN  Go to ER for evaluation .   ILD (interstitial lung disease) ([place]) Smoking-related interstitial lung disease with progression.  Patient has high symptom burden.  Continues to smoke smoking cessation was discussed.  He has multiple comorbidities and significant deconditioning. Currently having exacerbation complicated by hypotension will need further evaluation.  On return office visit need to revisit end-of-life discussions would be a good candidate for palliative care and/or hospice. Smoking cessation is key.  May need to consider assisted living.  Plan  ER evaluation   Chronic respiratory failure with hypoxia ([name]) Continue on oxygen 6 L.  O2 saturation goal is greater than 88 to 90%.     Rexene Edison, NP 03/07/2020

## 2020-03-07 NOTE — Telephone Encounter (Signed)
Melissa from [facility] returning call.

## 2020-03-07 NOTE — Progress Notes (Signed)
   Covid-19 Vaccination Clinic  Name:  [name]    MRN: [id number] DOB: Jan 14, 1956  03/07/2020  [name] was observed post Covid-19 immunization for 15 minutes without incident. He was provided with Vaccine Information Sheet and instruction to access the V-Safe system.   [name] was instructed to call 911 with any severe reactions post vaccine: [name] Difficulty breathing  . Swelling of face and throat  . A fast heartbeat  . A bad rash all over body  . Dizziness and weakness   Immunizations Administered    Name Date Dose VIS Date Route   Moderna COVID-19 Vaccine 03/07/2020  4:41 PM 0.5 mL 06/2019 Intramuscular   Manufacturer: Moderna   Lot: [id number]   [place]: [id number]

## 2020-03-07 NOTE — Assessment & Plan Note (Signed)
Continue on oxygen 6 L.  O2 saturation goal is greater than 88 to 90%.

## 2020-03-07 NOTE — ED Triage Notes (Signed)
Pt here from [facility] for admission. Pt reports shortness of breath for greater than two weeks, frequent falls, and hypotension. Wears 6L O2. Endorses chest heaviness.

## 2020-03-07 NOTE — Assessment & Plan Note (Signed)
Smoking-related interstitial lung disease with progression.  Patient has high symptom burden.  Continues to smoke smoking cessation was discussed.  He has multiple comorbidities and significant deconditioning. Currently having exacerbation complicated by hypotension will need further evaluation.  On return office visit need to revisit end-of-life discussions would be a good candidate for palliative care and/or hospice. Smoking cessation is key.  May need to consider assisted living.  Plan  ER evaluation

## 2020-03-07 NOTE — Telephone Encounter (Signed)
Patient upset about being unable to get his supplies through [employer].  He currently needs a battery for his POC and has been told that they do not have any.  He also needs other tanks and is told they do not have any.  I placed a call to [facility] verbalizing his frustrations to her and requesting a return call from her.  Will await f/u call from her.

## 2020-03-07 NOTE — Telephone Encounter (Signed)
Called and spoke with [name], [employer].  [name] stated she is looking into providing the Patient with a new POC battery.  She was not sure if they had any, but suggested if they didn't have any batteries maybe the Patient could look online to possibly purchase a POC battery. [name] stated [employer] had reached out to Patient today to schedule bringing O2 tanks, but had to leave a message for patient to call back to schedule tank delivery. [employer] Patient, and spoke with someone who stated Patient was not home, but she would let him know we had called.

## 2020-04-04 ENCOUNTER — Ambulatory Visit: Payer: Medicaid Other | Attending: Internal Medicine

## 2020-04-04 DIAGNOSIS — Z23 Encounter for immunization: Secondary | ICD-10-CM

## 2020-04-04 NOTE — Progress Notes (Signed)
   Covid-19 Vaccination Clinic  Name:  [name]    MRN: [id number] DOB: Oct 11, 1955  04/04/2020  [name] was observed post Covid-19 immunization for 15 minutes without incident. He was provided with Vaccine Information Sheet and instruction to access the V-Safe system.   [name] was instructed to call 911 with any severe reactions post vaccine: [name] Difficulty breathing  . Swelling of face and throat  . A fast heartbeat  . A bad rash all over body  . Dizziness and weakness   Immunizations Administered    Name Date Dose VIS Date Route   Moderna COVID-19 Vaccine 04/04/2020  4:00 PM 0.5 mL 06/2019 Intramuscular   Manufacturer: Moderna   Lot: [id number]   [place]: 02111-552-08

## 2020-04-12 ENCOUNTER — Encounter (HOSPITAL_COMMUNITY): Payer: Self-pay | Admitting: Emergency Medicine

## 2020-04-12 ENCOUNTER — Emergency Department (HOSPITAL_COMMUNITY)
Admission: EM | Admit: 2020-04-12 | Discharge: 2020-04-13 | Disposition: A | Discharge status: Home / Self Care | Payer: Medicaid Other | Attending: Emergency Medicine | Admitting: Emergency Medicine

## 2020-04-12 ENCOUNTER — Emergency Department (HOSPITAL_COMMUNITY): Payer: Medicaid Other

## 2020-04-12 DIAGNOSIS — Z5321 Procedure and treatment not carried out due to patient leaving prior to being seen by health care provider: Secondary | ICD-10-CM | POA: Insufficient documentation

## 2020-04-12 DIAGNOSIS — R0602 Shortness of breath: Secondary | ICD-10-CM | POA: Insufficient documentation

## 2020-04-12 DIAGNOSIS — E8770 Fluid overload, unspecified: Secondary | ICD-10-CM | POA: Diagnosis present

## 2020-04-12 LAB — CBC
HCT: 40.3 % (ref 39.0–52.0)
Hemoglobin: 12.8 g/dL — ABNORMAL LOW (ref 13.0–17.0)
MCH: 26.8 pg (ref 26.0–34.0)
MCHC: 31.8 g/dL (ref 30.0–36.0)
MCV: 84.5 fL (ref 80.0–100.0)
Platelets: 215 10*3/uL (ref 150–400)
RBC: 4.77 MIL/uL (ref 4.22–5.81)
RDW: 14.2 % (ref 11.5–15.5)
WBC: 12.2 10*3/uL — ABNORMAL HIGH (ref 4.0–10.5)
nRBC: 0 % (ref 0.0–0.2)

## 2020-04-12 LAB — BASIC METABOLIC PANEL
Anion gap: 13 (ref 5–15)
BUN: 19 mg/dL (ref 8–23)
CO2: 24 mmol/L (ref 22–32)
Calcium: 8.8 mg/dL — ABNORMAL LOW (ref 8.9–10.3)
Chloride: 99 mmol/L (ref 98–111)
Creatinine, Ser: 1.92 mg/dL — ABNORMAL HIGH (ref 0.61–1.24)
GFR calc Af Amer: 42 mL/min — ABNORMAL LOW (ref >60)
GFR calc non Af Amer: 36 mL/min — ABNORMAL LOW (ref >60)
Glucose, Bld: 259 mg/dL — ABNORMAL HIGH (ref 70–99)
Potassium: 3.6 mmol/L (ref 3.5–5.1)
Sodium: 136 mmol/L (ref 135–145)

## 2020-04-12 LAB — BRAIN NATRIURETIC PEPTIDE: B Natriuretic Peptide: 84.2 pg/mL (ref 0.0–100.0)

## 2020-04-12 NOTE — ED Triage Notes (Addendum)
Pt arrives via [facility], pt went to PCP for swelling and sob, states they told him he was in fluid overload and needed to be seen in the ed, pt drove home and called ems. Pt reports he takes torsemide 60mg  bid, denies any missed doses. On 6L of O2 at baseline.

## 2020-04-12 NOTE — Progress Notes (Signed)
Patient did not show for appointment.   

## 2020-04-12 NOTE — ED Notes (Signed)
Pt LWBS 

## 2020-04-18 ENCOUNTER — Other Ambulatory Visit: Payer: Self-pay

## 2020-04-18 ENCOUNTER — Telehealth: Payer: Self-pay | Admitting: Primary Care

## 2020-04-18 ENCOUNTER — Ambulatory Visit (INDEPENDENT_AMBULATORY_CARE_PROVIDER_SITE_OTHER): Payer: Medicaid Other | Admitting: Primary Care

## 2020-04-18 ENCOUNTER — Telehealth: Payer: Self-pay | Admitting: Internal Medicine

## 2020-04-18 ENCOUNTER — Encounter: Payer: Self-pay | Admitting: Primary Care

## 2020-04-18 DIAGNOSIS — J810 Acute pulmonary edema: Secondary | ICD-10-CM | POA: Diagnosis not present

## 2020-04-18 DIAGNOSIS — J9611 Chronic respiratory failure with hypoxia: Secondary | ICD-10-CM | POA: Diagnosis not present

## 2020-04-18 DIAGNOSIS — J849 Interstitial pulmonary disease, unspecified: Secondary | ICD-10-CM

## 2020-04-18 DIAGNOSIS — J441 Chronic obstructive pulmonary disease with (acute) exacerbation: Secondary | ICD-10-CM

## 2020-04-18 DIAGNOSIS — J449 Chronic obstructive pulmonary disease, unspecified: Secondary | ICD-10-CM

## 2020-04-18 MED ORDER — AZITHROMYCIN 250 MG PO TABS: ORAL_TABLET | ORAL | 0 refills | Status: DC

## 2020-04-18 NOTE — Assessment & Plan Note (Addendum)
-   Smoking-related interstitial lung disease with progression. High symptom burden. Patient continues to smoke but has cut down.  End of life discussion started today, he has been on hospice in the past and is open to returning. I will discuss with Dr. Zelphia Cairo who is his primary pulmonologist whether or not this is appropriate at this time. If not then recommend referral to palliative care and consider repeat CT imaging.  FU in 4-6 weeks.

## 2020-04-18 NOTE — Telephone Encounter (Signed)
Dr. Chase Caller Patient of yours RB ILD, chronic respiratory failure on high flow oxygen, chronic pain on narcotics.  He reports worsening shortness of breath over the last several months.  He has been in and out of the emergency room several times.  Feels he has fluid in his lungs.  He has increased cough with mucus production.  Chest x-ray at [facility] ED showed pleural and parenchymal density left base with blunting.  He is compliant with Damadex.  He still smoking 4 cigarettes a day, he has cut back from 1 pack daily. O2 96% 5L POC. [place] symptom assessment in note/   As this is my first time seeing patient today, I wanted to get your input. He has been on hospice in the past, He is open to resuming.  Do recommend palliative versus hospice? Would you want to repeat CT imaging of chest?    -Beth

## 2020-04-18 NOTE — Assessment & Plan Note (Signed)
-   Increased shortness of breath, wheezing, chest tightness and prod cough x 2 weeks  - Chest x-ray 04/16/20 [facility] ED showed pleural and parenchymal density at the left lung base with blunting of costophrenic angle, without evidence of acute infiltrate.  - Sending in [place] for acute exacerbations, holding off on oral steriods d/t hyperglycemia  - Continue Breztri two puffs twice daily, adding aerochamber

## 2020-04-18 NOTE — Telephone Encounter (Signed)
Called and spoke with pt who stated he has been to the ED recently, last visit 9/28. Pt stated he was told that he had fluid on lungs. CXR was done at ED (info is able to be seen in pt's chart in care everywhere).  Pt states that he has been having to use 6L O2 to keep sats stable. Pt says on the 6L, sats are ranging between 94-96%.  ED Progress Note 16: 54 assumed care from [facility], [place]. Reviewed history and plan with PA Dixon. Chest x-ray shows pleural and parenchymal disease of the left lung base, without evidence of acute infiltrate. Patient afebrile and nontoxic in appearance, with O2 sats 96 to 99% on his baseline 6 L nasal cannula. Initial troponin 74, troponin at 1 hour 71, delta -3. Awaiting third troponin for final disposition. 18:36 Final troponin in lab, results pending. Patient without complaints. O2 sats 98% on 6L. 19:10 final troponin 67, delta 3 hours -7. O2 sats 97% on 6 L. Patient without new complaints. Patient discharged in stable condition and advised to remain on Demadex. He is to watch his O2 sats closely at home and follow-up with his primary care provider tomorrow. Return the emergency department for worsening symptoms.   Pt tried to go to [facility] ED to be seen but after being in the lobby for 14 hours, he ended up going to [place] and was able to be seen there.  Stated to pt that we should get him scheduled for an appt for further evaluation and he verbalized understanding. appt scheduled with Derl Barrow, NP at 12pm today 9/30. Nothing further needed.

## 2020-04-18 NOTE — Progress Notes (Signed)
@Patient  ID: [name], male    DOB: 1956/07/05, 64 y.o.   MRN: [id number]  Chief Complaint  Patient presents with  . Follow-up    having SHOB, hurting in his lungs, when walking he can hear fluid swishing    Referring provider: No ref. provider found  HPI: 64 year old male, current some day smoker (41-pack-year history).  Medical history significant for COPD, obstructive sleep apnea, interstitial lung disease with emphysema, pulmonary edema, chronic respiratory failure on high flow oxygen, past substance abuse, chronic pain on narcotics, polypharmacy, type 2 diabetes, essential hypertension, orthostatic hypotension, coronary arthrosclerosis, metabolic encephalopathy, chronic kidney disease stage III, renal cell carcinoma.  Patient of Dr. Chase Caller, last seen by pulmonary nurse practitioner on 03/07/2020, during this visit patient's blood pressure was 76/50 and was advised to present to emergency room.   Interstitial lung disease with emphysema -surgical lung biopsy May 18, 2014 read in [place] as DIP with burned-out Langerhans' cell histiocytosis but reinterpreted February 2020 at [facility] as [facility] = smoking-related interstitial fibrosis associated with emphysema. Our local pulmonary pathologist feels strongly that there is no evidence of Langerhan histiocytosis X.  Instead he has smoking-related interstitial fibrosis (SRIF) with associated emphysema.   04/18/2020 Patient presents today for an acute office visit with reports of increased shortness of breath over the last several months. Associated chest tightness and productive cough. He is on 5L POC today, O2 96%. He wears 6L oxygen continuously at baseline. Over the last several weeks he reports having a harder time breathing. He has new cough with clear mucus. He is compliant with Breztri two puffs twice daily. He uses nebulizer at home 4 times a day with no significant improvement.  He is on Damadex 60 mg twice daily.   Reports that his blood pressure is low first thing in the morning and gradually improves throughout the day. He is on Humulin 80 units TID, BS 300-400 (previously 700s).    He has been in and out of the emergency room several times the past few weeks. He went to [facility] on Friday, September 24 and waited 14 hours without triage.  Patient presented to [facility] emergency room on 9/28 with increasing shortness of breath x2 weeks with associated productive cough and chest pain. Chest x-ray showed pleural and parenchymal density at the left lung base with blunting of costophrenic angle, without evidence of acute infiltrate.  Right lung clear.  EKG showed sinus rhythm with first-degree AV block with premature atrial complexes.  Gen 5 cardiac troponin peak 74, final troponin 67, delta 3 hours -7.  ProBNP unremarkable at 404.  Creatinine at baseline 1.98.  O2 stable on chronic 6 L of oxygen.   Feels his health has been declining. He is open to going back on hospice. He has a nursing aid that comes to his house 3 hours a day, he does not want to lose this. He is still smoking 4 cigarettes a day, he has cut back from 1 ppd. Denies fever, chills, sweats, dizziness.    [place] Symptom Assessment Numerical Scale 0 is no problem -> 10 worst problem 10/14/2015   04/23/2017  08/01/2018  06/08/2019  10/30/2019  04/18/2020   No Pain -> Worst pain 0 7 9 9 7 8   No Tiredness -> Worset tiredness 10 9 10 10 10 10   No Nausea -> Worst nausea 0 0 0 3 2 5   No Depression -> Worst depression 7 10 5 9 9 10   No Anxiety ->  Worst Anxiety 8 5 5 8 8 10   No Drowsiness -> Worst Drowsiness 10 3 (though was nodding off) 1 9 3 6   Best appetite-> Worst Appetitle 3 4 3 3 8 4   Best Feeling of well being -> Worst feeling 5 7 1 4 8 8   Cough   8 9 10 5   No dyspnea-> Worst dyspnea 8 8 10 10 10 9   Other problem (none -> severe) 10 - chest discomfort, when active 7 leg pain 10 10 chest discomfort and other miscellaneous  9  Chest discomfort   Completed by  patient patient patient patient  Patient/provider help              CT chest high resolution 08/11/2018 Lungs/Pleura: Postoperative changes of wedge resection are again noted in the left lower lobe and tracking along the left major fissure, similar to the prior study. However, compared to the prior examination there is greatly increased pleuroparenchymal thickening and architectural distortion in the mid to lower left hemithorax with extensive parenchymal banding throughout this region. These findings are markedly asymmetric compared to the contralateral side where there is only minimal linear scarring in the posterior aspect of the right lower lobe in the most dependent portion of the lung. High-resolution images otherwise demonstrate no widespread areas of ground-glass attenuation, septal thickening, traction bronchiectasis or honeycombing elsewhere in the lungs. Inspiratory and expiratory imaging demonstrates mild air trapping, most evident in the left lower lobe. No acute consolidative airspace disease. No pleural effusions. Diffuse bronchial wall thickening with mild centrilobular and paraseptal emphysema.    Allergies  Allergen Reactions  . Oxycodone Itching and Nausea Only    Pt is able to tolerate if taken with benadryl    Immunization History  Administered Date(s) Administered  . Fluad Quad(high Dose 65+) 05/03/2019  . Influenza Split 04/04/2014, 03/27/2015  . Moderna SARS-COVID-2 Vaccination 03/07/2020, 04/04/2020  . Pneumococcal Polysaccharide-23 11/04/2012  . Zoster 02/05/2020    Past Medical History:  Diagnosis Date  . Anxiety   . Arthritis   . Cholecystitis, acute 12/20/2013   Lap chole 6/5  . Cocaine abuse ([place])   . COPD (chronic obstructive pulmonary disease) ([place])   . Coronary atherosclerosis of native coronary artery    a. 03/09/2013 Cath/PCI: LM nl, LAD: 50p, 16m (2.5x16 promus DES), LCX nl, OM1 min irregs,  LPL/LPDA diff dzs, RCA nondom, mod diff dzs, EF 55%.  . Depression   . Essential hypertension   . GERD (gastroesophageal reflux disease)   . Headache   . Hepatitis Late 1970s  . History of nephrolithiasis   . History of pneumonia   . History of stroke    Right MCA distribution, residual left-sided weakness  . Hyperlipidemia   . Noncompliance   . Renal cell carcinoma [facility])    Status post radical right nephrectomy August 2015  . Sleep apnea    On CPAP, 4L O2 no cpap at home yet  . Type 2 diabetes mellitus ([place]) 2011    Tobacco History: Social History   Tobacco Use  Smoking Status Current Some Day Smoker  . Packs/day: 1.00  . Years: 41.00  . Pack years: 41.00  . Types: Cigarettes  . Start date: 05/03/1972  Smokeless Tobacco Never Used  Tobacco Comment   2-3 per day as of 03/07/2020   Ready to quit: Not Answered Counseling given: Not Answered Comment: 2-3 per day as of 03/07/2020   Outpatient Medications Prior to Visit  Medication Sig Dispense Refill  . ALPRAZolam (  XANAX) 0.5 MG tablet Take 1 tablet (0.5 mg total) by mouth 3 (three) times daily as needed for anxiety.  0  . atorvastatin (LIPITOR) 20 MG tablet Take 20 mg by mouth at bedtime.  1  . clopidogrel (PLAVIX) 75 MG tablet TAKE ONE TABLET BY MOUTH EVERY DAY 90 tablet 1  . diltiazem (CARDIZEM CD) 180 MG 24 hr capsule Take 1 capsule (180 mg total) by mouth daily. 90 capsule 2  . docusate sodium (COLACE) 100 MG capsule Take 200 mg by mouth at bedtime.     [name] esomeprazole (NEXIUM) 40 MG capsule Take 40 mg by mouth daily at 12 noon. Pt only take as needed    . FLUoxetine (PROZAC) 40 MG capsule Take 40 mg by mouth daily. Take with 10 mg capsule = 50 mg total    . gabapentin (NEURONTIN) 300 MG capsule Take 900 mg by mouth 2 (two) times daily.     . Glycopyrrolate-Formoterol (BEVESPI AEROSPHERE) 9-4.8 MCG/ACT AERO Inhale 2 puffs into the lungs 2 (two) times daily. 1 Inhaler 11  . insulin regular human CONCENTRATED (HUMULIN R  U-500 KWIKPEN) 500 UNIT/ML kwikpen Inject into the skin.    [name] isosorbide mononitrate (IMDUR) 60 MG 24 hr tablet TAKE ONE TABLET BY MOUTH DAILY 90 tablet 1  . linagliptin (TRADJENTA) 5 MG TABS tablet Take 5 mg by mouth daily.    [name] LORazepam (ATIVAN) 1 MG tablet Take 0.5 mg by mouth at bedtime.     . nitroGLYCERIN (NITROSTAT) 0.4 MG SL tablet Place 1 tablet (0.4 mg total) under the tongue every 5 (five) minutes as needed for chest pain. IF 3 TABLETS ARE NECESSARY CALL 911 25 tablet 3  . oxybutynin (DITROPAN-XL) 10 MG 24 hr tablet Take 10 mg by mouth daily.  11  . oxyCODONE-acetaminophen (PERCOCET) 10-325 MG tablet Take 1 tablet by mouth every 6 (six) hours as needed for pain. 30 tablet 0  . potassium chloride SA (K-DUR,KLOR-CON) 20 MEQ tablet Take 20 mEq by mouth 2 (two) times daily.     . pregabalin (LYRICA) 25 MG capsule Take 25 mg by mouth 2 (two) times daily.    . Probiotic Product (PROBIOTIC PO) Take 1 capsule by mouth daily.    . tamsulosin (FLOMAX) 0.4 MG CAPS capsule Take 0.4 mg by mouth daily.     [name] torsemide (DEMADEX) 20 MG tablet TAKE THREE TABLETS BY MOUTH TWICE DAILY 540 tablet 1  . zolpidem (AMBIEN) 5 MG tablet Take 5 mg by mouth at bedtime as needed. for insomnia    . insulin aspart (NOVOLOG) 100 UNIT/ML injection Inject 30 Units into the skin 3 (three) times daily with meals.  (Patient not taking: Reported on 04/18/2020)    . LANTUS SOLOSTAR 100 UNIT/ML Solostar Pen Inject 115 Units into the skin 2 (two) times daily.  (Patient not taking: Reported on 04/18/2020)  4   No facility-administered medications prior to visit.    Review of Systems  Review of Systems  Constitutional: Positive for fatigue.  Respiratory: Positive for cough, chest tightness, shortness of breath and wheezing.   Gastrointestinal: Positive for abdominal distention.    Physical Exam  BP 116/70   Pulse 90   Temp 97.6 F (36.4 C) (Oral)   Wt 232 lb 6 oz (105.4 kg)   SpO2 96%   BMI 34.32 kg/m  Physical  Exam Constitutional:      Appearance: Normal appearance.  HENT:     Mouth/Throat:     Mouth: Mucous membranes  are moist.     Pharynx: Oropharynx is clear.  Cardiovascular:     Rate and Rhythm: Normal rate and regular rhythm.     Comments: Trace LE edema Pulmonary:     Effort: No respiratory distress.     Breath sounds: Wheezing and rhonchi present.  Abdominal:     General: There is distension.  Neurological:     General: No focal deficit present.     Mental Status: He is alert. Mental status is at baseline.  Psychiatric:        Mood and Affect: Mood normal.        Behavior: Behavior normal.        Thought Content: Thought content normal.        Judgment: Judgment normal.      Lab Results:  CBC    Component Value Date/Time   WBC 12.2 (H) 04/12/2020 1348   RBC 4.77 04/12/2020 1348   HGB 12.8 (L) 04/12/2020 1348   HCT 40.3 04/12/2020 1348   PLT 215 04/12/2020 1348   MCV 84.5 04/12/2020 1348   MCH 26.8 04/12/2020 1348   MCHC 31.8 04/12/2020 1348   RDW 14.2 04/12/2020 1348   LYMPHSABS 1.8 01/05/2020 1503   MONOABS 0.5 01/05/2020 1503   EOSABS 0.1 01/05/2020 1503   BASOSABS 0.0 01/05/2020 1503    BMET    Component Value Date/Time   NA 136 04/12/2020 1348   K 3.6 04/12/2020 1348   CL 99 04/12/2020 1348   CO2 24 04/12/2020 1348   GLUCOSE 259 (H) 04/12/2020 1348   BUN 19 04/12/2020 1348   CREATININE 1.92 (H) 04/12/2020 1348   CREATININE 2.67 (H) 09/26/2014 1138   CALCIUM 8.8 (L) 04/12/2020 1348   GFRNONAA 36 (L) 04/12/2020 1348   GFRAA 42 (L) 04/12/2020 1348    BNP    Component Value Date/Time   BNP 84.2 04/12/2020 1348    ProBNP    Component Value Date/Time   PROBNP 58.0 05/10/2015 1555    Imaging: DG Chest 2 View  Result Date: 04/12/2020 CLINICAL DATA:  Shortness of EXAM: CHEST - 2 VIEW COMPARISON:  March 07, 2020 and January 05, 2020 chest radiograph; August 30, 2019 chest CT FINDINGS: There is scarring in the left mid and lower lung regions with  ill-defined interstitial and airspace in these areas and probable small amount of loculated pleural effusion. Right lung is clear. Heart is mildly enlarged, stable, with pulmonary vascularity normal. No adenopathy. There is aortic atherosclerosis. No bone lesions. IMPRESSION: Scarring with ill-defined opacity in the left mid lower lung regions, likely primarily representing chronic scar but with questionable superimposed atelectasis and potential patchy airspace opacity. Suspect rather minimal loculated pleural effusion on the left as well. Right lung clear.  Stable cardiac prominence. Aortic Atherosclerosis (ICD10-I70.0). Electronically Signed   By: Lowella Grip III M.D.   On: 04/12/2020 14:12     Assessment & Plan:   ILD (interstitial lung disease) ([place]) - Smoking-related interstitial lung disease with progression. High symptom burden. Patient continues to smoke but has cut down.  End of life discussion started today, he has been on hospice in the past and is open to returning. I will discuss with Dr. Zelphia Cairo who is his primary pulmonologist whether or not this is appropriate at this time. If not then recommend referral to palliative care and consider repeat CT imaging.  FU in 4-6 weeks.   Chronic respiratory failure with hypoxia ([name]) - Chronic hypoxemic respiratory failure on 6L oxygen at  baseline. O2 96% today on 5L POC.   Pulmonary edema - Continue Damadex 60mg  twice daily  COLD (chronic obstructive lung disease) ([name]) - Increased shortness of breath, wheezing, chest tightness and prod cough x 2 weeks  - Chest x-ray 04/16/20 [facility] showed pleural and parenchymal density at the left lung base with blunting of costophrenic angle, without evidence of acute infiltrate.  - Sending in [place] for acute exacerbations, holding off on oral steriods d/t hyperglycemia  - Continue Breztri two puffs twice daily, adding aerochamber    Martyn Ehrich, NP 04/18/2020

## 2020-04-18 NOTE — Patient Instructions (Addendum)
Recommendations: - Continue BREZTRI inhaler two puffs twice daily (Use Aerochamber aka spacer)  - Take Mucinex 600mg  twice daily  - Holding off on CT imaging right now  - Holding off on oral steriods d/t hyperglycemia  - I will talk with Dr. Chase Caller about palliative care vs hospice  Follow-up: -4-6 weeks with Dr. Chase Caller or his first available ( 30 min slot)    Palliative Care Palliative care involves care of body, mind, and spirit in order to improve a person's quality of life. Palliative care services are offered to people dealing with serious and life-threatening illnesses, often in the hospital or in a long-term care setting. The specific services are different for each person and are based on the person's needs and preferences. Palliative care requires a team of people who ensure:  Control of pain and other symptoms.  Family support.  Spiritual support.  Emotional and social support.  Comfort. Palliative care is a way to bring comfort and peace of mind to a person and his or her family. It can have a positive impact on the person's quality of life and the course of the illness. What is the difference between palliative care and hospice? Palliative care and hospice have similar goals of managing symptoms, promoting comfort, improving quality of life, and maintaining a person's dignity. However, palliative care may be offered during any phase of a serious illness, while hospice care is usually offered when a person is expected to live for 6 months or less. Who can receive palliative care services? Palliative care is offered to children and adults who are seriously ill. It is often offered in cases where a person:  Is not responding well to treatment.  Needs pain management.  Had treatment or surgery and developed symptoms that are difficult to manage.  Is undergoing a treatment to cure a condition (active treatment), like chemotherapy.  Has a diagnosis of an advanced  disease, or a disease that shortens his or her life. A health care provider will usually recommend palliative care services when more support would be helpful. Family members and friends may also receive palliative care services to cope with stress and other concerns. Who makes up the palliative care team? The following people make up a palliative care team:  The person receiving care and his or her family.  Physicians, including primary health care providers and specialists.  Nurses.  A Education officer, museum. Depending on a person's needs, the following people may also be included on a palliative care team:  A pain specialist.  A hospice specialist.  A financial or Research officer, trade union.  Religious or spiritual leaders.  A care coordinator or case manager.  A bereavement coordinator. The team will talk with the person and his or her family about:  The role of the pain specialist and the hospice specialist.  The person's physical symptoms, such as pain, nausea, vomiting, and shortness of breath.  Stress, depression, and anxiety symptoms.  Function and mobility issues and how to stay as active as possible.  Treatment options and how they may affect life.  Spiritual wishes, such as rituals and prayer.  Legacy and memory making activities.  Life and death as a normal process.  Advance directives or living wills, health care proxies, and end-of-life care.  Any other concerns or issues. The palliative care team will make it okay to talk about difficult issues and topics. They will address spiritual and emotional concerns and give the person's preferences high importance. Summary  Palliative care involves  care of body, mind, and spirit in order to improve a person's quality of life.  The specific services are different for each person and are based on the person's needs and preferences.  Palliative care is a way to bring comfort and peace of mind to a person and his or her  family. This information is not intended to replace advice given to you by your health care provider. Make sure you discuss any questions you have with your health care provider. Document Revised: 08/03/2017 Document Reviewed: 09/21/2016 Elsevier Patient Education  [place].

## 2020-04-18 NOTE — Assessment & Plan Note (Addendum)
-   Chronic hypoxemic respiratory failure on 6L oxygen at baseline. O2 96% today on 5L POC.

## 2020-04-18 NOTE — Assessment & Plan Note (Signed)
-   Continue Damadex 60mg  twice daily

## 2020-04-22 NOTE — Telephone Encounter (Signed)
Called and spoke with pt letting him know the info stated by both Beth and MR and that recommendations was to refer him to [facility] and palliative. Also stated to pt that we will repeat CT in feb 2022. Pt verbalized understanding. Referrals have been placed. Nothing further needed.

## 2020-04-22 NOTE — Telephone Encounter (Signed)
He lives with his mom. He is dozing off due to high doses of pain meds. Very difficult sitution. Please consider [facility] and/or home palliation. We can do repeat HRCT in feb/march 2022. Thanks for reaching out

## 2020-04-22 NOTE — Telephone Encounter (Signed)
Can you please let patient know I discussed with MR and he recommend referral to [facility] and palliative. Needs repeat HRCT in February 2022

## 2020-04-23 ENCOUNTER — Telehealth: Payer: Self-pay | Admitting: Internal Medicine

## 2020-04-23 NOTE — Telephone Encounter (Signed)
Called and spoke with patient to go over message from yesterday with Raquel Sarna. Let patient know that referral for palliative care was placed and that Dr. Chase Caller wanted to repeat his CT in Feb of 2022. Patient expressed understanding. Nothing further needed at this time.

## 2020-04-26 ENCOUNTER — Other Ambulatory Visit: Payer: Self-pay

## 2020-04-26 ENCOUNTER — Other Ambulatory Visit: Payer: Medicaid Other | Admitting: Nurse Practitioner

## 2020-04-26 DIAGNOSIS — Z515 Encounter for palliative care: Secondary | ICD-10-CM

## 2020-04-26 DIAGNOSIS — J9611 Chronic respiratory failure with hypoxia: Secondary | ICD-10-CM

## 2020-04-26 NOTE — Progress Notes (Signed)
[facility] Consult Note Telephone: [phone]  Fax: [phone]  PATIENT NAME: [name] [address] [place] [address] [phone] (home)  DOB: November 05, 1955 MRN: [id number]  PRIMARY CARE PROVIDER:    Synthia Innocent [address] [place], [place] [address] Phone: [phone] Fax: [phone]  REFERRING PROVIDER:  Brand Males, MD (Pulmonology)  RESPONSIBLE PARTY:   Extended Emergency Contact Information Primary Emergency Contact: Noa,[name] Address: [place]          [place], [place] [address] [name] of [place] Phone: [phone] Mobile Phone: [phone] Relation: Mother Secondary Emergency Contact: [name] [place] of [place] Phone: [phone] Mobile Phone: [phone] Relation: Brother  I met face to face with patient and family in home.  ASSESSMENT AND RECOMMENDATIONS:   1. Advance Care Planning/Goals of Care:  Visit at the request of Dr. Brand Males for palliative care consult. Today's visit consisted of building trust and discussions on Palliative Care medicine as specialized medical care for people living with serious illness, aimed at facilitating better quality of life through symptoms relief, assisting with advance care plan and establishing goals of care. Patient report having experience with palliative care and Hospice. Patient report he was a former Hospice patient, report being on Hospice care for 2-4 years. He was discharged from Hospice care 3 years ago. Report having bad experience with Hospice care and was initially hesitant to accept Palliative care services. Patient report one of his dissatisfaction was that he overdosed 5 times while under Hospice care services related to over medication. Provided validation, acknowledged patient's concerns.  Emotional support provided. Goals of care: Patient's goal of care is comfort, desire to live out the  rest of his life in comfort. Directives: Patient's code status is DNR. Patient has signed DNR and MOST form at home. Copies uploaded to [facility] EMR.  2. Symptom Management: Patient with end stage interstitial lung disease and history of renal cancer s/p radical right nephrectomy August 2015. Patient had ED visit on 04/16/2020 for SOB, was evaluated and discharged the same day. Patient is a current cigarette smoker( report smoking 4 sticks a day), expressed unwillingness to quit smoking saying smoking and watching TV are the only activities he enjoys doing. Patient report chronic pain in his abdomen, worse on right upper quadrant below his right breast and across his upper abdomen. Patient report having pain of 10/10 at it's worse, and 5/10 today, which he said is tolerable. His current pain regimen is Oxycodone-Acetaminophen 10-386m every 6-8hrs as needed and Tylenol 655mevery 6hrs as needed. Patient report feeling well today, report having good days and bad days, saying "sometimes I feel like there is nothing wrong with me". COPD is end stage, patient on continuous oxygen supplementation at 6L. Patient report activity intolerance, mostly stays indoors. Oxygen saturation today is 96%. Bilateral lower extremity edema related to Heart failure is managed with Torsemide 603mID. Patient voiced no other concerns today, no sign of acute distress noted. Palliative care will continue to provide support to patient, family and the medical team.   3. Follow up Palliative Care Visit: Patient left with my contact info, patient know to call if worsening symptoms. Palliative care will continue to follow for goals of care clarification and symptom management. Return in about 4 weeks or prn.  4. Family /Caregiver/Community Supports: Patient lives with his mother who assist in his care. He receives personal care services 3 hrs a day 5 days  in a week.   5. Cognitive / Functional decline: Patient awake and alert,  able to make his own decisions. He requires assistance with his ADLs, but able to feed self and ambulate without assistive device. Patient unable to ambulate to his mail box without getting SOB. He is continent of bowel and bladder.   I spent 80 minutes providing this consultation, time includes time spent with patient/family, chart review, provider coordination, and documentation.  More than 50% of the time in this consultation was spent coordinating communication.   HISTORY OF PRESENT ILLNESS:  [name] is a 64 y.o. year old male with multiple medical problems including COPD, obstructive sleep apnea, interstitial lung disease with emphysema, pulmonary edema, chronic respiratory failure on high flow oxygen, past substance abuse, chronic pain on narcotics, polypharmacy, type 2 diabetes, essential hypertension, orthostatic hypotension, coronary arthrosclerosis, metabolic encephalopathy, chronic kidney disease stage III, renal cell carcinoma. Palliative Care was asked to follow this patient by consultation request of to help address advance care planning and goals of care. This is an initial visit.  CODE STATUS: DNR  PPS: 50%  HOSPICE ELIGIBILITY/DIAGNOSIS: TBD  PAST MEDICAL HISTORY:  Past Medical History:  Diagnosis Date   Anxiety    Arthritis    Cholecystitis, acute 12/20/2013   Lap chole 6/5   Cocaine abuse ([place])    COPD (chronic obstructive pulmonary disease) ([name])    Coronary atherosclerosis of native coronary artery    a. 03/09/2013 Cath/PCI: LM nl, LAD: 50p, 67m(2.5x16 promus DES), LCX nl, OM1 min irregs, LPL/LPDA diff dzs, RCA nondom, mod diff dzs, EF 55%.   Depression    Essential hypertension    GERD (gastroesophageal reflux disease)    Headache    Hepatitis Late 1970s   History of nephrolithiasis    History of pneumonia    History of stroke    Right MCA distribution, residual left-sided weakness   Hyperlipidemia    Noncompliance    Renal cell carcinoma  ([name])    Status post radical right nephrectomy August 2015   Sleep apnea    On CPAP, 4L O2 no cpap at home yet   [place]    SOCIAL HX:  Social History   Tobacco Use   Smoking status: Current Some Day Smoker    Packs/day: 1.00    Years: 41.00    Pack years: 41.00    Types: Cigarettes    Start date: 05/03/1972   Smokeless tobacco: Never Used   Tobacco comment: 2-3 per day as of 03/07/2020  Substance Use Topics   Alcohol use: No    Alcohol/week: 0.0 standard drinks   FAMILY HX:  Family History  Problem Relation Age of Onset   Cancer Mother        Thyroid - living in her 844's   Hypertension Other    Diabetes Other    Stroke Other    Lung cancer Father    CAD Father    Cancer Maternal Grandmother        Breast   Cancer Maternal Grandfather        Throat and stomach   CAD Brother     ALLERGIES:  Allergies  Allergen Reactions   Oxycodone Itching and Nausea Only    Pt is able to tolerate if taken with benadryl     PERTINENT MEDICATIONS:  Outpatient Encounter Medications as of 04/26/2020  Medication Sig   ALPRAZolam (XANAX) 0.5 MG tablet Take 1 tablet (0.5 mg  total) by mouth 3 (three) times daily as needed for anxiety.   atorvastatin (LIPITOR) 20 MG tablet Take 20 mg by mouth at bedtime.   azithromycin (ZITHROMAX) 250 MG tablet Zpack taper as directed   clopidogrel (PLAVIX) 75 MG tablet TAKE ONE TABLET BY MOUTH EVERY DAY   diltiazem (CARDIZEM CD) 180 MG 24 hr capsule Take 1 capsule (180 mg total) by mouth daily.   docusate sodium (COLACE) 100 MG capsule Take 200 mg by mouth at bedtime.    esomeprazole (NEXIUM) 40 MG capsule Take 40 mg by mouth daily at 12 noon. Pt only take as needed   FLUoxetine (PROZAC) 40 MG capsule Take 40 mg by mouth daily. Take with 10 mg capsule = 50 mg total   gabapentin (NEURONTIN) 300 MG capsule Take 900 mg by mouth 2 (two) times daily.    Glycopyrrolate-Formoterol (BEVESPI AEROSPHERE)  9-4.8 MCG/ACT AERO Inhale 2 puffs into the lungs 2 (two) times daily.   insulin aspart (NOVOLOG) 100 UNIT/ML injection Inject 30 Units into the skin 3 (three) times daily with meals.  (Patient not taking: Reported on 04/18/2020)   insulin regular human CONCENTRATED (HUMULIN R U-500 KWIKPEN) 500 UNIT/ML kwikpen Inject into the skin.   isosorbide mononitrate (IMDUR) 60 MG 24 hr tablet TAKE ONE TABLET BY MOUTH DAILY   LANTUS SOLOSTAR 100 UNIT/ML Solostar Pen Inject 115 Units into the skin 2 (two) times daily.  (Patient not taking: Reported on 04/18/2020)   linagliptin (TRADJENTA) 5 MG TABS tablet Take 5 mg by mouth daily.   LORazepam (ATIVAN) 1 MG tablet Take 0.5 mg by mouth at bedtime.    nitroGLYCERIN (NITROSTAT) 0.4 MG SL tablet Place 1 tablet (0.4 mg total) under the tongue every 5 (five) minutes as needed for chest pain. IF 3 TABLETS ARE NECESSARY CALL 911   oxybutynin (DITROPAN-XL) 10 MG 24 hr tablet Take 10 mg by mouth daily.   oxyCODONE-acetaminophen (PERCOCET) 10-325 MG tablet Take 1 tablet by mouth every 6 (six) hours as needed for pain.   potassium chloride SA (K-DUR,KLOR-CON) 20 MEQ tablet Take 20 mEq by mouth 2 (two) times daily.    pregabalin (LYRICA) 25 MG capsule Take 25 mg by mouth 2 (two) times daily.   Probiotic Product (PROBIOTIC PO) Take 1 capsule by mouth daily.   tamsulosin (FLOMAX) 0.4 MG CAPS capsule Take 0.4 mg by mouth daily.    torsemide (DEMADEX) 20 MG tablet TAKE THREE TABLETS BY MOUTH TWICE DAILY   zolpidem (AMBIEN) 5 MG tablet Take 5 mg by mouth at bedtime as needed. for insomnia   No facility-administered encounter medications on file as of 04/26/2020.    PHYSICAL EXAM / ROS:   Current and past weights: Ht 43f9", Wt. 229lb, BMI 33.8kg/m2 General: obese, ill appearing, sitting in chair in NAD, coherent and cooperative Cardiovascular: denied chest pain, non-pitting edema to BLE Pulmonary: no cough, no increased SOB, wheezing Abdomen: appetite fair,  denied constipation, continent of bowel, abdominal distension GU: denies dysuria, continent of urine MSK:  no joint and ROM abnormalities, ambulatory Skin: no rashes or wounds reported or noted on exposed skin Neurological: Weakness, but otherwise nonfocal  QJari Favre DNP, AGPCNP-BC

## 2020-05-02 ENCOUNTER — Telehealth: Payer: Self-pay

## 2020-05-02 ENCOUNTER — Other Ambulatory Visit: Payer: Self-pay | Admitting: Obstetrics and Gynecology

## 2020-05-02 NOTE — Telephone Encounter (Signed)
Volunteer check in call made for palliative care: has pain, tired, but nurse coming friday

## 2020-05-02 NOTE — Patient Outreach (Signed)
Care Coordination  05/02/2020  ADIS STURGILL [birth year] [phone]  RNCM called  patient to confirm Palliative Care is involved and needs are being met. Per patient's Mother, patient does not feel like talking to anyone today.  Patient agreed to return call another day.  RNCM will schedule return call.  Aida Raider RN, BSN [place]   [facility] - Managed Medicaid High Risk [phone]

## 2020-05-15 ENCOUNTER — Other Ambulatory Visit: Payer: Medicaid Other | Admitting: Nurse Practitioner

## 2020-05-15 ENCOUNTER — Other Ambulatory Visit: Payer: Self-pay

## 2020-05-15 DIAGNOSIS — R110 Nausea: Secondary | ICD-10-CM

## 2020-05-15 DIAGNOSIS — Z515 Encounter for palliative care: Secondary | ICD-10-CM

## 2020-05-15 NOTE — Progress Notes (Addendum)
[facility] Consult Note Telephone: [phone]  Fax: [phone]  PATIENT NAME: [name] [address] [place] [address] [phone] (home)  DOB: 06-05-1956 MRN: [id number]  PRIMARY CARE PROVIDER:    Synthia Innocent [address] [place], [place] [address] Phone: [phone] Fax: [phone]  REFERRING PROVIDER:   Dr. Chase Caller (Pulmonology)  RESPONSIBLE PARTY:   Extended Emergency Contact Information Primary Emergency Contact: Duval,[name] Address: [place]          [place],  [address] [name] of [place] Phone: [phone] Mobile Phone: [phone] Relation: Mother Secondary Emergency Contact: [name] [place] of [place] Phone: [phone] Mobile Phone: [phone] Relation: Brother  I met face to face with patient and family in home.    ASSESSMENT AND RECOMMENDATIONS:   Goal of care:Patient's goal of care is comfort, desire to live out the rest of his life in comfort. Directives: Patient's code status is DNR. Patient has signed DNR and MOST form at home. Copies on [facility] EMR. MOST details include Limited additional intervention, antibiotics if indicated, IV fluids if indicated, feeding tube for a defined trial period.  2. Symptom Management:  Patient with end-stage interstitial lung disease dependent on supplement oxygen, history of renal cancer s/p radical right nephrectomy. Patient with uncontrolled diabetes dependent on insulin. Patient called office reporting feeling unwell with nausea, diarrhea, and weakness, symptoms started a week ago. Patient seen today. EMS was called, later cancelled as patient did not appear toxic, was able to get out of bed independently, no report of fever, chills, or increased SOB. Hyperglycemia: Patient report blood sugars range of 400s to 800 in the last week. On Humalog 80units TID, followed by Endocrinology Dr. Kathlene Cote. Per encounter documented on Epic on 05/03/2020, patient was asked to increase dose to 90 units TID when he feels better and eating more consistently. CBG this morning 489. Patient self-administered 80units of insulin. Patient will recheck blood sugar before lunch. Recommend consistency in monitoring blood sugars and avoid skipping insulin administration even if he did not eat a meal. Nausea: Patient report constant nausea without vomiting in the last week, not on any med for nausea. Recommend antiemetic as needed. Prescription for Zofran 22m by mouth every 6hrs PRN called to his pharmacy, requested for 30 tabs, no refill. Encouraged patient to take pill before meal to prevent nausea. Diarrhea: Patient report diarrhea, report having 5 loose stools before 9am today. Denied blood in stool, report some lower abdominal cramping. Patient takes Imodium as needed, has taken 2 tabs today. Patient takes one capsule of Probiotic daily, recommend patient increase Probitics to twice a day. Recommend patient take liquid diet like Gatorade, Jello, broth. If nausea subsides to try bland foods. Patient encouraged to eat and drink in frequent small volumes. Patient advised to go to ED if worsening symptoms, bloody stools, increasing SOB, severe weakness, or blood sugar consistently greater than 600. Patient and family verbalized understanding. Patient was sitting in the dining area by the end of the visit, family brought him chicken noddle soup and he was drinking the broth.   3. Follow up Palliative Care Visit: Palliative care will continue to follow for goals of care clarification and symptom management. Return 2 weeks or prn.  4. Family /Caregiver/Community Supports: Patient lives with his mother who assist in his care. He receives personal care services 3 hrs a day 5 days a week.  5. Cognitive / Functional decline: Patient awake  and alert, able to make his own decisions. He requires assistance with his ADLs, but  able to feed self and ambulate without assistive device. Patient unable to ambulate to his mail box without getting SOB. He is continent of bowel and bladder.    I spent 50 minutes providing this consultation, time includes time spent with patient and family, chart review, provider coordination, and documentation. More than 50% of the time in this consultation was spent coordinating communication.  HISTORY OF PRESENT ILLNESS:  [name] is a 64 y.o. year old male with multiple medical problems including COPD, obstructive sleep apnea, interstitial lung disease with emphysema,pulmonary edema, chronic respiratory failure on high flow oxygen,past substance abuse,chronic pain on narcotics,polypharmacy,type 2 diabetes, essential hypertension, orthostatic hypotension, coronary arthrosclerosis, metabolic encephalopathy, chronic kidney disease stage III, renal cell carcinoma. Palliative Care was asked to follow this patient to help address advance care planning, goals of care and symptom management. This is an acute visit.  CODE STATUS: DNR  PPS: 40%  HOSPICE ELIGIBILITY/DIAGNOSIS: TBD. Patient was discharged from Hospice care 3 years ago.  PAST MEDICAL HISTORY:  Past Medical History:  Diagnosis Date  . Anxiety   . Arthritis   . Cholecystitis, acute 12/20/2013   Lap chole 6/5  . Cocaine abuse ([place])   . COPD (chronic obstructive pulmonary disease) ([facility])   . Coronary atherosclerosis of native coronary artery    a. 03/09/2013 Cath/PCI: LM nl, LAD: 50p, 36m(2.5x16 promus DES), LCX nl, OM1 min irregs, LPL/LPDA diff dzs, RCA nondom, mod diff dzs, EF 55%.  . Depression   . Essential hypertension   . GERD (gastroesophageal reflux disease)   . Headache   . Hepatitis Late 1970s  . History of nephrolithiasis   . History of pneumonia   . History of stroke    Right MCA distribution, residual left-sided weakness  . Hyperlipidemia   . Noncompliance   . Renal cell carcinoma ([facility]    Status post  radical right nephrectomy August 2015  . Sleep apnea    On CPAP, 4L O2 no cpap at home yet  . [place]    SOCIAL HX:  Social History   Tobacco Use  . Smoking status: Current Some Day Smoker    Packs/day: 1.00    Years: 41.00    Pack years: 41.00    Types: Cigarettes    Start date: 05/03/1972  . Smokeless tobacco: Never Used  . Tobacco comment: 2-3 per day as of 03/07/2020  Substance Use Topics  . Alcohol use: No    Alcohol/week: 0.0 standard drinks   FAMILY HX:  Family History  Problem Relation Age of Onset  . Cancer Mother        Thyroid - living in her 878's  .Marland KitchenHypertension Other   . Diabetes Other   . Stroke Other   . Lung cancer Father   . CAD Father   . Cancer Maternal Grandmother        Breast  . Cancer Maternal Grandfather        Throat and stomach  . CAD Brother     ALLERGIES:  Allergies  Allergen Reactions  . Oxycodone Itching and Nausea Only    Pt is able to tolerate if taken with benadryl     PERTINENT MEDICATIONS:  Outpatient Encounter Medications as of 05/15/2020  Medication Sig  . ALPRAZolam (XANAX) 0.5 MG tablet Take 1 tablet (0.5 mg total) by mouth 3 (three) times daily as needed  for anxiety.  [name] atorvastatin (LIPITOR) 20 MG tablet Take 20 mg by mouth at bedtime.  [name] azithromycin (ZITHROMAX) 250 MG tablet Zpack taper as directed  . clopidogrel (PLAVIX) 75 MG tablet TAKE ONE TABLET BY MOUTH EVERY DAY  . diltiazem (CARDIZEM CD) 180 MG 24 hr capsule Take 1 capsule (180 mg total) by mouth daily.  [name] docusate sodium (COLACE) 100 MG capsule Take 200 mg by mouth at bedtime.   [name] esomeprazole (NEXIUM) 40 MG capsule Take 40 mg by mouth daily at 12 noon. Pt only take as needed  . FLUoxetine (PROZAC) 40 MG capsule Take 40 mg by mouth daily. Take with 10 mg capsule = 50 mg total  . gabapentin (NEURONTIN) 300 MG capsule Take 900 mg by mouth 2 (two) times daily.   . Glycopyrrolate-Formoterol (BEVESPI AEROSPHERE) 9-4.8 MCG/ACT AERO Inhale  2 puffs into the lungs 2 (two) times daily.  . insulin aspart (NOVOLOG) 100 UNIT/ML injection Inject 30 Units into the skin 3 (three) times daily with meals.  (Patient not taking: Reported on 04/18/2020)  . insulin regular human CONCENTRATED (HUMULIN R U-500 KWIKPEN) 500 UNIT/ML kwikpen Inject into the skin.  [name] isosorbide mononitrate (IMDUR) 60 MG 24 hr tablet TAKE ONE TABLET BY MOUTH DAILY  . LANTUS SOLOSTAR 100 UNIT/ML Solostar Pen Inject 115 Units into the skin 2 (two) times daily.  (Patient not taking: Reported on 04/18/2020)  . linagliptin (TRADJENTA) 5 MG TABS tablet Take 5 mg by mouth daily.  [name] LORazepam (ATIVAN) 1 MG tablet Take 0.5 mg by mouth at bedtime.   . nitroGLYCERIN (NITROSTAT) 0.4 MG SL tablet Place 1 tablet (0.4 mg total) under the tongue every 5 (five) minutes as needed for chest pain. IF 3 TABLETS ARE NECESSARY CALL 911  . oxybutynin (DITROPAN-XL) 10 MG 24 hr tablet Take 10 mg by mouth daily.  [name] oxyCODONE-acetaminophen (PERCOCET) 10-325 MG tablet Take 1 tablet by mouth every 6 (six) hours as needed for pain.  . potassium chloride SA (K-DUR,KLOR-CON) 20 MEQ tablet Take 20 mEq by mouth 2 (two) times daily.   . pregabalin (LYRICA) 25 MG capsule Take 25 mg by mouth 2 (two) times daily.  . Probiotic Product (PROBIOTIC PO) Take 1 capsule by mouth daily.  . tamsulosin (FLOMAX) 0.4 MG CAPS capsule Take 0.4 mg by mouth daily.   [name] torsemide (DEMADEX) 20 MG tablet TAKE THREE TABLETS BY MOUTH TWICE DAILY  . zolpidem (AMBIEN) 5 MG tablet Take 5 mg by mouth at bedtime as needed. for insomnia   No facility-administered encounter medications on file as of 05/15/2020.    PHYSICAL EXAM / ROS:   V/S: 132/68, P 96, RR 20, 98% on 6L General:  frail appearing, obese, lying in bed in NAD Cardiovascular: denied chest pain, no edema  Pulmonary: no cough, no increased SOB, on 6L oxygen via nasal canula GI: appetite poor, continent of bowel GU: denies dysuria, continent of urine MSK:  no joint and  ROM abnormalities, ambulatory Skin: no rashes or wounds reported Neurological: Weakness, but otherwise nonfocal  Jari Favre, DNP, AGPCNP-BC

## 2020-05-29 ENCOUNTER — Other Ambulatory Visit: Payer: Self-pay

## 2020-05-29 ENCOUNTER — Other Ambulatory Visit: Payer: Medicaid Other | Admitting: Nurse Practitioner

## 2020-05-29 ENCOUNTER — Other Ambulatory Visit: Payer: Self-pay | Admitting: Cardiology

## 2020-05-29 DIAGNOSIS — E1165 Type 2 diabetes mellitus with hyperglycemia: Secondary | ICD-10-CM

## 2020-05-29 DIAGNOSIS — Z515 Encounter for palliative care: Secondary | ICD-10-CM

## 2020-05-29 NOTE — Progress Notes (Signed)
[facility] Consult Note Telephone: [phone]  Fax: [phone]  PATIENT NAME: [name] [address] [place] [address] [phone] (home)  DOB: Dec 13, 1955 MRN: [id number]  PRIMARY CARE PROVIDER:    Synthia Innocent [address] [place], [place] [address] Phone: [phone] Fax: [phone]  REFERRING PROVIDER:   Brand Males, MD (Pulmonology)  RESPONSIBLE PARTY:   Extended Emergency Contact Information Primary Emergency Contact: Gloria,[name] Address: [address]          [place], [place] [address] [name] of [place] Phone: [phone] Mobile Phone: [phone] Relation: Mother Secondary Emergency Contact: [name] [place] of [place] Phone: [phone] Mobile Phone: [phone] Relation: Brother  I met face to face with patient and family in home.  ASSESSMENT AND RECOMMENDATIONS:   Advance Care Planning: Goal of care: Patient's goal of care is comfort, desire to live out the rest of his life in comfort. Directives:  Signed DNR and MOST form at home. Copies on [facility] EMR. MOST details include Limited additional intervention, antibiotics if indicated, IV fluids if indicated, feeding tube for a defined trial period.  Symptom Management:  Patient was seen 2 weeks ago for hyperglycemia related to uncontrolled diabetes, patient at the time reported constant nausea without vomiting with blood sugars 400 to 800s. He was started on Zofran 76m every 6hrs PRN, advised to increased insulin as recommended by his pcp from 80 units to 90 units TID. Patient endorsed good response to Zofran. Report bettter control of blood surgars on 90units Novolog TID, report CBG range in high 200s and in the 300s. He denied nausea, report feeling better overall. Patient report he went out shopping for clothing today, tolerated the activity well. Denied increased SOB, denied uncontrolled  pain. Patient continues to ambulate within his home with Rolator Walker and motorized wheel chair outside his home. His mother is his main care giver, has a paid care giver that assist with his ADLs 3hrs a day 5 days a week. I provided general support and encouragement, no other unmet needs identified.  Follow up Palliative Care Visit: Palliative care will continue to follow for goals of care clarification and symptom management. Return in about 4 weeks or prn.  I spent 30 minutes providing this consultation, time includes time spent with patient/family, chart review, provider coordination, and documentation. More than 50% of the time in this consultation was spent coordinating communication.   HISTORY OF PRESENT ILLNESS:Kevin W Knightis a 64y.o.year old malewith multiple medical problems including COPD, obstructive sleep apnea, interstitial lung disease with emphysema,pulmonary edema, chronic respiratory failure on high flow oxygen,past substance abuse,chronic pain on narcotics,polypharmacy,type 2 diabetes, essential hypertension, orthostatic hypotension, coronary arthrosclerosis, metabolic encephalopathy, chronic kidney disease stage III, renal cell carcinoma.Palliative Care was asked to follow this patient to help address advance care planning, goals of care and symptom management. This is a follow up visit from 05/15/2020.  CODE STATUS: DNR  PPS: 40%  HOSPICE ELIGIBILITY/DIAGNOSIS: TBD  PHYSICAL EXAM / ROS:   General:  frail appearing, obese, noted in bed in his room in NAD Cardiovascular: denied chest pain, no edema  Pulmonary: no acute cough, no increased SOB, on 6L oxygen via nasal canula GI: appetite fair, continent of bowel GU: denies dysuria, continent of urine MSK:  no joint and ROM abnormalities, ambulatory Skin: no rashes or wounds reported Neurological: chronic weakness  PAST MEDICAL HISTORY:  Past Medical History:  Diagnosis Date  . Anxiety   .  Arthritis   .  Cholecystitis, acute 12/20/2013   Lap chole 6/5  . Cocaine abuse ([facility])   . COPD (chronic obstructive pulmonary disease) ([facility])   . Coronary atherosclerosis of native coronary artery    a. 03/09/2013 Cath/PCI: LM nl, LAD: 50p, 55m(2.5x16 promus DES), LCX nl, OM1 min irregs, LPL/LPDA diff dzs, RCA nondom, mod diff dzs, EF 55%.  . Depression   . Essential hypertension   . GERD (gastroesophageal reflux disease)   . Headache   . Hepatitis Late 1970s  . History of nephrolithiasis   . History of pneumonia   . History of stroke    Right MCA distribution, residual left-sided weakness  . Hyperlipidemia   . Noncompliance   . Renal cell carcinoma ([facility]    Status post radical right nephrectomy August 2015  . Sleep apnea    On CPAP, 4L O2 no cpap at home yet  . Type 2 diabetes mellitus ([facility] 2011    SOCIAL HX:  Social History   Tobacco Use  . Smoking status: Current Some Day Smoker    Packs/day: 1.00    Years: 41.00    Pack years: 41.00    Types: Cigarettes    Start date: 05/03/1972  . Smokeless tobacco: Never Used  . Tobacco comment: 2-3 per day as of 03/07/2020  Substance Use Topics  . Alcohol use: No    Alcohol/week: 0.0 standard drinks   FAMILY HX:  Family History  Problem Relation Age of Onset  . Cancer Mother        Thyroid - living in her 859's  .Marland KitchenHypertension Other   . Diabetes Other   . Stroke Other   . Lung cancer Father   . CAD Father   . Cancer Maternal Grandmother        Breast  . Cancer Maternal Grandfather        Throat and stomach  . CAD Brother    ALLERGIES:  Allergies  Allergen Reactions  . Oxycodone Itching and Nausea Only    Pt is able to tolerate if taken with benadryl     PERTINENT MEDICATIONS:  Outpatient Encounter Medications as of 05/29/2020  Medication Sig  . ALPRAZolam (XANAX) 0.5 MG tablet Take 1 tablet (0.5 mg total) by mouth 3 (three) times daily as needed for anxiety.  .Marland Kitchenatorvastatin (LIPITOR) 20 MG tablet Take 20 mg by mouth at bedtime.    .Marland Kitchenazithromycin (ZITHROMAX) 250 MG tablet Zpack taper as directed  . clopidogrel (PLAVIX) 75 MG tablet TAKE ONE TABLET BY MOUTH EVERY DAY  . diltiazem (CARDIZEM CD) 180 MG 24 hr capsule Take 1 capsule (180 mg total) by mouth daily.  .Marland Kitchendocusate sodium (COLACE) 100 MG capsule Take 200 mg by mouth at bedtime.   .Marland Kitchenesomeprazole (NEXIUM) 40 MG capsule Take 40 mg by mouth daily at 12 noon. Pt only take as needed  . FLUoxetine (PROZAC) 40 MG capsule Take 40 mg by mouth daily. Take with 10 mg capsule = 50 mg total  . gabapentin (NEURONTIN) 300 MG capsule Take 900 mg by mouth 2 (two) times daily.   . Glycopyrrolate-Formoterol (BEVESPI AEROSPHERE) 9-4.8 MCG/ACT AERO Inhale 2 puffs into the lungs 2 (two) times daily.  . insulin aspart (NOVOLOG) 100 UNIT/ML injection Inject 30 Units into the skin 3 (three) times daily with meals.  (Patient not taking: Reported on 04/18/2020)  . insulin regular human CONCENTRATED (HUMULIN R U-500 KWIKPEN) 500 UNIT/ML kwikpen Inject into the skin.  .Marland Kitchenisosorbide mononitrate (  IMDUR) 60 MG 24 hr tablet TAKE ONE TABLET BY MOUTH DAILY  . LANTUS SOLOSTAR 100 UNIT/ML Solostar Pen Inject 115 Units into the skin 2 (two) times daily.  (Patient not taking: Reported on 04/18/2020)  . linagliptin (TRADJENTA) 5 MG TABS tablet Take 5 mg by mouth daily.  [name] LORazepam (ATIVAN) 1 MG tablet Take 0.5 mg by mouth at bedtime.   . nitroGLYCERIN (NITROSTAT) 0.4 MG SL tablet Place 1 tablet (0.4 mg total) under the tongue every 5 (five) minutes as needed for chest pain. IF 3 TABLETS ARE NECESSARY CALL 911  . oxybutynin (DITROPAN-XL) 10 MG 24 hr tablet Take 10 mg by mouth daily.  [name] oxyCODONE-acetaminophen (PERCOCET) 10-325 MG tablet Take 1 tablet by mouth every 6 (six) hours as needed for pain.  . potassium chloride SA (K-DUR,KLOR-CON) 20 MEQ tablet Take 20 mEq by mouth 2 (two) times daily.   . pregabalin (LYRICA) 25 MG capsule Take 25 mg by mouth 2 (two) times daily.  . Probiotic Product (PROBIOTIC PO)  Take 1 capsule by mouth daily.  . tamsulosin (FLOMAX) 0.4 MG CAPS capsule Take 0.4 mg by mouth daily.   [name] torsemide (DEMADEX) 20 MG tablet TAKE THREE TABLETS BY MOUTH TWICE DAILY  . zolpidem (AMBIEN) 5 MG tablet Take 5 mg by mouth at bedtime as needed. for insomnia   No facility-administered encounter medications on file as of 05/29/2020.    Jari Favre, DNP, AGPCNP-BC

## 2020-06-07 NOTE — Progress Notes (Signed)
Cardiology Office Note  Date: 06/10/2020   ID: Jariah, Jarmon [birth year], MRN [id number]  PCP:  [name], PA-C (Inactive)  Cardiologist:  [name], MD Electrophysiologist:  None   Chief Complaint  Patient presents with   Cardiac follow-up    History of Present Illness: [name] is a 64 y.o. male last seen in May.  He presents for a routine visit.  Chronically short of breath, NYHA class III at baseline.  Also intermittent angina symptoms as before which we are managing medically.  He has palliative care home services in place, recent note from November 10 reviewed.  He was seen in the Pulmonary clinic in September.  I reviewed his current medications which are outlined below.  He has had no change in current diuretic dosing.  Tends to run a low blood pressure as well, although not frankly symptomatic, no syncope.  This has limited up titration of therapy.  I reviewed his most recent lab work.  Past Medical History:  Diagnosis Date   Anxiety    Arthritis    Cholecystitis, acute 12/20/2013   Lap chole 6/5   Cocaine abuse ([name])    COPD (chronic obstructive pulmonary disease) ([name])    Coronary atherosclerosis of native coronary artery    a. 03/09/2013 Cath/PCI: LM nl, LAD: 50p, 33m (2.5x16 promus DES), LCX nl, OM1 min irregs, LPL/LPDA diff dzs, RCA nondom, mod diff dzs, EF 55%.   Depression    Essential hypertension    GERD (gastroesophageal reflux disease)    Headache    Hepatitis Late 1970s   History of nephrolithiasis    History of pneumonia    History of stroke    Right MCA distribution, residual left-sided weakness   Hyperlipidemia    Noncompliance    Renal cell carcinoma ([facility])    Status post radical right nephrectomy August 2015   Sleep apnea    On CPAP, 4L O2 no cpap at home yet   Type 2 diabetes mellitus ([facility]) 2011    Past Surgical History:  Procedure Laterality Date   APPENDECTOMY  1970's   CHOLECYSTECTOMY  N/A 12/22/2013   Procedure: LAPAROSCOPIC CHOLECYSTECTOMY ;  Surgeon: [name], MD;  Location: [facility];  Service: General;  Laterality: N/A;   ENDARTERECTOMY Right 10/08/2014   Procedure: Right ENDARTERECTOMY CAROTID;  Surgeon: [name], MD;  Location: [facility];  Service: Vascular;  Laterality: Right;   LAPAROSCOPIC LYSIS OF ADHESIONS  02/21/2014   Procedure: LAPAROSCOPIC LYSIS OF ADHESIONS EXTINSIVE;  Surgeon: [name], MD;  Location[facility];  Service: Urology;;   LEFT HEART CATHETERIZATION WITH CORONARY ANGIOGRAM N/A 06/02/2012   Procedure: LEFT HEART CATHETERIZATION WITH CORONARY ANGIOGRAM;  Surgeon: [name], MD;  [facility] C[facility] LAB;  Service: Cardiovascular;  Laterality: N/A;   LEFT HEART CATHETERIZATION WITH CORONARY ANGIOGRAM N/A 03/09/2013   Procedure: LEFT HEART CATHETERIZATION WITH CORONARY ANGIOGRAM;  Surgeon: [name], MD;  [facility];  Service: Cardiovascular;  Laterality: N/A;   LEFT HEART CATHETERIZATION WITH CORONARY ANGIOGRAM N/A 12/20/2013   Procedure: LEFT HEART CATHETERIZATION WITH CORONARY ANGIOGRAM;  Surgeon: [name], MD;  Location: [facility] C[facility] LAB;  Service: Cardiovascular;  Laterality: N/A;   LUNG BIOPSY Left 05/18/2014   Procedure: LUNG BIOPSY left upper lobe & left lower lobe;  Surgeon: [name], MD;  Location: [facility];  Service: Thoracic;  Laterality: Left;   PATCH ANGIOPLASTY Right 10/08/2014   Procedure: PATCH ANGIOPLASTY Right Carotid;  Surgeon: [name], MD;  Location: [facility];  Service: Vascular;  Laterality: Right;   ROBOT ASSISTED LAPAROSCOPIC NEPHRECTOMY Right 02/21/2014   Procedure: ROBOTIC ASSISTED LAPAROSCOPIC RIGHT NEPHRECTOMY ;  Surgeon: [name], MD;  Location[facility];  Service: Urology;  Laterality: Right;   VIDEO ASSISTED THORACOSCOPY Left 05/18/2014   Procedure: LEFT VIDEO ASSISTED THORACOSCOPY;  Surgeon: [name], MD;  Location: [facility];  Service: Thoracic;  Laterality: Left;    VIDEO BRONCHOSCOPY  05/18/2014   Procedure: VIDEO BRONCHOSCOPY;  Surgeon: [name], MD;  Location: [facility];  Service: Thoracic;;    Current Outpatient Medications  Medication Sig Dispense Refill   ALPRAZolam (XANAX) 0.5 MG tablet Take 1 tablet (0.5 mg total) by mouth 3 (three) times daily as needed for anxiety.  0   atorvastatin (LIPITOR) 20 MG tablet Take 20 mg by mouth at bedtime.  1   clopidogrel (PLAVIX) 75 MG tablet TAKE ONE TABLET BY MOUTH EVERY DAY 90 tablet 1   diltiazem (CARDIZEM CD) 180 MG 24 hr capsule Take 1 capsule (180 mg total) by mouth daily. 90 capsule 2   docusate sodium (COLACE) 100 MG capsule Take 200 mg by mouth at bedtime.      esomeprazole (NEXIUM) 40 MG capsule Take 40 mg by mouth daily at 12 noon. Pt only take as needed     FLUoxetine (PROZAC) 40 MG capsule Take 40 mg by mouth daily. Take with 10 mg capsule = 50 mg total     gabapentin (NEURONTIN) 300 MG capsule Take 900 mg by mouth 2 (two) times daily.      Glycopyrrolate-Formoterol (BEVESPI AEROSPHERE) 9-4.8 MCG/ACT AERO Inhale 2 puffs into the lungs 2 (two) times daily. 1 Inhaler 11   insulin lispro (HUMALOG) 100 UNIT/ML cartridge Inject 80 Units into the skin 3 (three) times daily with meals.     insulin regular human CONCENTRATED (HUMULIN R U-500 KWIKPEN) 500 UNIT/ML kwikpen Inject into the skin.     isosorbide mononitrate (IMDUR) 60 MG 24 hr tablet TAKE ONE TABLET BY MOUTH DAILY 90 tablet 1   linagliptin (TRADJENTA) 5 MG TABS tablet Take 5 mg by mouth daily.     LORazepam (ATIVAN) 1 MG tablet Take 0.5 mg by mouth at bedtime.      nitroGLYCERIN (NITROSTAT) 0.4 MG SL tablet Place 1 tablet (0.4 mg total) under the tongue every 5 (five) minutes as needed for chest pain. IF 3 TABLETS ARE NECESSARY CALL 911 25 tablet 3   oxybutynin (DITROPAN-XL) 10 MG 24 hr tablet Take 10 mg by mouth daily.  11   oxyCODONE-acetaminophen (PERCOCET) 10-325 MG tablet Take 1 tablet by mouth every 6 (six) hours  as needed for pain. 30 tablet 0   potassium chloride SA (K-DUR,KLOR-CON) 20 MEQ tablet Take 20 mEq by mouth 2 (two) times daily.      pregabalin (LYRICA) 25 MG capsule Take 25 mg by mouth 2 (two) times daily.     Probiotic Product (PROBIOTIC PO) Take 1 capsule by mouth daily.     tamsulosin (FLOMAX) 0.4 MG CAPS capsule Take 0.4 mg by mouth daily.      torsemide (DEMADEX) 20 MG tablet TAKE THREE TABLETS BY MOUTH TWICE DAILY 540 tablet 1   zolpidem (AMBIEN) 5 MG tablet Take 5 mg by mouth at bedtime as needed. for insomnia     No current facility-administered medications for this visit.   Allergies:  Oxycodone   ROS: No frank syncope.  Physical Exam: VS:  BP (!) 82/48    Pulse (!) 105    Ht 5\' 6"  (1.676 m)    Wt 228 lb (103.4 kg)    SpO2 95%    BMI 36.80 kg/m , BMI Body mass index is 36.8 kg/m.  Wt Readings from Last 3 Encounters:  06/10/20 228 lb (103.4 kg)  04/18/20 232 lb 6 oz (105.4 kg)  03/07/20 237 lb (107.5 kg)    General: Chronically ill-appearing male, wearing oxygen via nasal cannula.  No distress. HEENT: Conjunctiva and lids normal, wearing a mask. Neck: Supple, no elevated JVP or carotid bruits, no thyromegaly. Lungs: Coarse breath sounds with prolonged expiratory phase and rhonchi. Cardiac: Regular rate and rhythm, no S3 or significant systolic murmur, no pericardial rub. Abdomen: Protuberant, nontender. Extremities: Mild lower leg edema.  ECG:  An ECG dated 03/07/2020 was personally reviewed today and demonstrated:  Sinus tachycardia with PAC, prolonged PR interval, nonspecific ST changes.  Recent Labwork: 01/05/2020: ALT 19; AST 23 04/12/2020: B Natriuretic Peptide 84.2; BUN 19; Creatinine, Ser 1.92; Hemoglobin 12.8; Platelets 215; Potassium 3.6; Sodium 136     Component Value Date/Time   CHOL 128 12/19/2013 0220   TRIG 152 (H) 12/19/2013 0220   HDL 31 (L) 12/19/2013 0220   CHOLHDL 4.1 12/19/2013 0220   VLDL 30 12/19/2013 0220   LDLCALC 67 12/19/2013 0220     Other Studies Reviewed Today:  Cardiac catheterization 12/20/2013: Hemodynamic Findings: Central aortic pressure: 120/62 Left ventricular pressure: 127/7/15  Angiographic Findings:  Left main: Short segment. No obstructive disease.   Left Anterior Descending Artery: Moderate caliber vessel that courses to the apex. There is 40% proximal stenosis. The mid stented segment is patent without restenosis. The distal vessel has diffuse non-obstructive plaque. The diagonal branches are small in caliber with no obstructive disease.   Circumflex Artery: Large dominant system with large bifurcating first obtuse marginal branch and three small caliber posterolateral branches. The OM branch has diffuse 50% proximal stenosis, no focally obstructive lesions. The three small caliber posterolateral branches all have diffuse moderate to severe stenosis that is unchanged from last cath.   Right Coronary Artery: Small non-dominant vessel with 40% mid vessel stenosis.   Left Ventricular Angiogram: LVEF=65%  Impression: 1. Double vessel CAD with patent stent mid LAD 2. Moderate non-obstructive disease in the Circumflex and RCA 3. Normal LV systolic function  Echocardiogram 07/05/2015: Study Conclusions  - Left ventricle: The cavity size was normal. Wall thickness was normal. Systolic function was normal. The estimated ejection fraction was in the range of 60% to 65%. Doppler parameters are consistent with abnormal left ventricular relaxation (grade 1 diastolic dysfunction).  High-resolution chest CT 08/30/2019: IMPRESSION: 1. Negative for interstitial lung disease. 2. Interval clearing of previously seen subpleural ground-glass in the anterior segment right upper lobe. 3. Aortic atherosclerosis (ICD10-I70.0). Coronary artery calcification. 4. Emphysema (ICD10-J43.9).  Assessment and Plan:  1.  CAD with history of previous LAD intervention, patent stent site in 2015 and  otherwise moderate circumflex and RCA disease.  Plan to continue medical therapy for management of angina symptoms.  On Plavix, Lipitor, Cardizem CD, and Imdur.  2.  Severe emphysematous lung disease and interstitial lung disease, continuing to follow with Dr. Chase Caller and currently with palliative care services at home.  He continues on supplemental oxygen with chronic hypoxic respiratory failure.  3.  Chronic diastolic heart failure, weight is down compared to last check in September, he continues on stable dose of Demadex with potassium supplements.  Medication Adjustments/Labs and  Tests Ordered: Current medicines are reviewed at length with the patient today.  Concerns regarding medicines are outlined above.   Tests Ordered: No orders of the defined types were placed in this encounter.   Medication Changes: No orders of the defined types were placed in this encounter.   Disposition:  Follow up 6 months in the [place] office.  Signed, Satira Sark, MD, [facility] 06/10/2020 10:39 AM    [place] [facility] [place], [place], [place] [address] Phone: [phone]; Fax: [phone]

## 2020-06-10 ENCOUNTER — Ambulatory Visit: Payer: Medicaid Other | Admitting: Cardiology

## 2020-06-10 ENCOUNTER — Encounter: Payer: Self-pay | Admitting: Cardiology

## 2020-06-10 VITALS — BP 82/48 | HR 105 | Ht 66.0 in | Wt 228.0 lb

## 2020-06-10 DIAGNOSIS — I5032 Chronic diastolic (congestive) heart failure: Secondary | ICD-10-CM

## 2020-06-10 DIAGNOSIS — I25119 Atherosclerotic heart disease of native coronary artery with unspecified angina pectoris: Secondary | ICD-10-CM | POA: Diagnosis not present

## 2020-06-10 NOTE — Patient Instructions (Addendum)

## 2020-06-11 ENCOUNTER — Other Ambulatory Visit: Payer: Self-pay | Admitting: Cardiology

## 2020-07-11 ENCOUNTER — Other Ambulatory Visit: Payer: Self-pay | Admitting: Cardiology

## 2020-08-01 ENCOUNTER — Telehealth: Payer: Self-pay | Admitting: Nurse Practitioner

## 2020-08-01 ENCOUNTER — Other Ambulatory Visit: Payer: Self-pay

## 2020-08-01 ENCOUNTER — Other Ambulatory Visit: Payer: Self-pay | Admitting: Obstetrics and Gynecology

## 2020-08-01 NOTE — Telephone Encounter (Signed)
Called patient to offer to schedule a Palliative f/u visit, no answer - left message requesting a return call to schedule, left my name and call back number.

## 2020-08-01 NOTE — Patient Outreach (Signed)
Care Coordination  08/01/2020  [name] 02-20-1956 [id number]   RNCM called patient at scheduled time.  Patient  is pleased with services he is currently receiving from [facility].  Patient stated he had not received a visit since 11/10/21and did not know why, also stated he was not sure which phone number to call to inquire.   RNCM called [facility] and they will reach out to patient to schedule a visit.  RNCM called patient to update him and gave him the phone number for [facility].  Patient appreciative.  Aida Raider RN, BSN [place]  [facility] - Managed Medicaid High Risk [phone].

## 2020-08-05 ENCOUNTER — Other Ambulatory Visit: Payer: Self-pay

## 2020-08-05 ENCOUNTER — Other Ambulatory Visit: Payer: Self-pay | Admitting: Obstetrics and Gynecology

## 2020-08-05 NOTE — Patient Instructions (Signed)
Visit Information  Mr. Willmore was given information about Medicaid Managed Care team care coordination services as a part of their [place] Medicaid benefit. ZERICK PREVETTE verbally consented to engagement with the [facility] Managed Care team.   For questions related to your [facility], please call: [phone] or visit the homepage here: [email or web address]  If you would like to schedule transportation through your [facility], please call the following number at least 2 days in advance of your appointment: [phone]  Mr. Kley - following are the goals we discussed in your visit today:  Goals Addressed            This Visit's Progress   . [place] My Quality of Life       Timeframe:  Short-Term Goal Priority:  High Start Date:     08/01/20                        Expected End Date:  08/15/20.                     Follow Up Date 08/05/2020   Anticipate call from [facility] to discuss Palliative Care. [phone]    Patient verbalizes understanding of instructions provided today.   The Managed Medicaid care management team will reach out to the patient again over the next 7 days.  The patient has been provided with contact information for the Managed Medicaid care management team and has been advised to call with any health related questions or concerns.   Zymier Rodgers G., RN  Following is a copy of your plan of care:  Patient Care Plan: General Plan of Care (Adult)    Problem Identified: Coping Skills (General Plan of Care)     Problem Identified: Quality of Life (General Plan of Care)     Goal: Quality of Life Maintained   Start Date: 08/01/2020  Expected End Date: 11/03/2020  This Visit's Progress: On track  Priority: High  Note:   Current Barriers:  . Care Coordination needs related to palliative care engagement. [name] to independently  consult with palliative care team.  Nurse Case Manager Clinical Goal(s):  [name] Over the next 14 days, patient will verbalize understanding of plan for palliative care follow up.  Interventions:  . Inter-disciplinary care team collaboration (see longitudinal plan of care) . Evaluation of current treatment plan related to palliative care  and patient's adherence to plan as established by provider. . Advised patient to anticipate call from [facility] to discuss plan. [name] with [name] regarding palliative care services.  Patient Goals/Self-Care Activities Over the next 14 days, patient will:  - receive follow up from [facility].  Follow Up Plan: The Managed Medicaid care management team will reach out to the patient again over the next 7 days.  The patient has been provided with contact information for the Managed Medicaid care management team and has been advised to call with any health related questions or concerns.

## 2020-08-05 NOTE — Patient Outreach (Addendum)
Medicaid Managed Care   Nurse Care Manager Note  08/05/2020 Name:  [name] MRN:  [id number] DOB:  [birth year]  [name] is an 65 y.o. year old male who is a primary patient of [name], PA-C (Inactive).  The [facility] Managed Care Coordination team was consulted for assistance with:    palliative care follow up.  [name] was given information about Medicaid Managed Care Coordination team services today. [name] agreed to services and verbal consent obtained.  Engaged with patient by telephone for initial visit in response to provider referral for case management and/or care coordination services.   Assessments/Interventions:  Review of past medical history, allergies, medications, health status, including review of consultants reports, laboratory and other test data, was performed as part of comprehensive evaluation and provision of chronic care management services.  SDOH (Social Determinants of Health) assessments and interventions performed:   Care Plan  Allergies  Allergen Reactions  . Oxycodone Itching and Nausea Only    Pt is able to tolerate if taken with benadryl    Medications Reviewed Today    Reviewed by Gayla Medicus, RN (Registered Nurse) on 08/05/20 at [place] List Status: <None>  Medication Order Taking? Sig Documenting Provider Last Dose Status Informant  ALPRAZolam (XANAX) 0.5 MG tablet [id number] No Take 1 tablet (0.5 mg total) by mouth 3 (three) times daily as needed for anxiety. Dhungel, Nishant, MD Taking Active Self  atorvastatin (LIPITOR) 20 MG tablet [id number] No Take 20 mg by mouth at bedtime. [provider] Taking Active Self  clopidogrel (PLAVIX) 75 MG tablet [id number] No TAKE ONE TABLET BY MOUTH EVERY DAY Satira Sark, MD Taking Active   diltiazem (CARDIZEM CD) 180 MG 24 hr capsule [id number] No Take 1 capsule (180 mg total) by mouth daily. Satira Sark, MD Taking Active   docusate sodium (COLACE) 100 MG  capsule [id number] No Take 200 mg by mouth at bedtime.  [provider] Taking Active Self  esomeprazole (NEXIUM) 40 MG capsule [id number] No Take 40 mg by mouth daily at 12 noon. Pt only take as needed [provider] Taking Active   FLUoxetine (PROZAC) 40 MG capsule [id number] No Take 40 mg by mouth daily. Take with 10 mg capsule = 50 mg total [provider] Taking Active Self  gabapentin (NEURONTIN) 300 MG capsule [id number] No Take 900 mg by mouth 2 (two) times daily.  [provider] Taking Active Self  Glycopyrrolate-Formoterol (BEVESPI AEROSPHERE) 9-4.8 MCG/ACT AERO [id number] No Inhale 2 puffs into the lungs 2 (two) times daily. Brand Males, MD Taking Active Self  insulin lispro (HUMALOG) 100 UNIT/ML cartridge [id number] No Inject 80 Units into the skin 3 (three) times daily with meals. [provider] Taking Active   insulin regular human CONCENTRATED (HUMULIN R U-500 KWIKPEN) 500 UNIT/ML Claiborne Rigg [id number] No Inject into the skin. [provider] Taking Active   isosorbide mononitrate (IMDUR) 60 MG 24 hr tablet [id number]  TAKE ONE TABLET BY MOUTH DAILY Satira Sark, MD  Active   linagliptin (TRADJENTA) 5 MG TABS tablet [id number] No Take 5 mg by mouth daily. [provider] Taking Active Self  LORazepam (ATIVAN) 1 MG tablet [id number] No Take 0.5 mg by mouth at bedtime.  [provider] Taking Active Self  nitroGLYCERIN (NITROSTAT) 0.4 MG SL tablet [id number]  Place 1 tablet (0.4 mg total) under the tongue every 5 (five) minutes as needed for chest pain. IF 3 TABLETS ARE NECESSARY  CALL 911 Satira Sark, MD  Active   oxybutynin (DITROPAN-XL) 10 MG 24 hr tablet [id number] No Take 10 mg by mouth daily. [provider] Taking Active Self  oxyCODONE-acetaminophen (PERCOCET) 10-325 MG tablet [id number] No Take 1 tablet by mouth every 6 (six) hours as needed for pain. Dhungel, Nishant, MD Taking Active Self   potassium chloride SA (K-DUR,KLOR-CON) 20 MEQ tablet [id number] No Take 20 mEq by mouth 2 (two) times daily.  [provider] Taking Active Self  pregabalin (LYRICA) 25 MG capsule [id number] No Take 25 mg by mouth 2 (two) times daily. [provider] Taking Active   Probiotic Product (PROBIOTIC PO) [id number] No Take 1 capsule by mouth daily. [provider] Taking Active Self  tamsulosin (FLOMAX) 0.4 MG CAPS capsule [id number] No Take 0.4 mg by mouth daily.  [provider] Taking Active Self  torsemide (DEMADEX) 20 MG tablet [id number] No TAKE THREE TABLETS BY MOUTH TWICE DAILY Satira Sark, MD Taking Active   zolpidem (AMBIEN) 5 MG tablet [id number] No Take 5 mg by mouth at bedtime as needed. for insomnia [provider] Taking Active           Patient Active Problem List   Diagnosis Date Noted  . Sepsis ([place]) 05/19/2019  . Sepsis with hypotension ([place]) 12/23/2018  . Chronic respiratory failure with hypoxia ([place]) 12/23/2018  . UTI symptoms 11/08/2018  . Abdominal distention 11/08/2018  . Chest pain 11/08/2018  . Hyperglycemia 11/08/2018  . AKI (acute kidney injury) ([place]) 11/08/2018  . Acute metabolic encephalopathy 18/84/1660  . Polypharmacy 06/03/2018  . Diabetes mellitus due to underlying condition, uncontrolled, with hyperglycemia, with long-term current use of insulin ([place]) 06/03/2018  . Community acquired pneumonia 05/31/2018  . Acute on chronic respiratory failure with hypoxia ([place]) 05/31/2018  . Acute respiratory failure ([place])   . Decreased mobility and endurance 07/19/2015  . CKD (chronic kidney disease) stage 3, GFR 30-59 ml/min ([name]) 07/06/2015  . Acute on chronic respiratory failure ([place]) 07/05/2015  . COPD with acute exacerbation ([place]) 07/05/2015  . Interstitial lung disease ([place]) 04/10/2015  . Cough syncope 04/10/2015  . Dyspnea 04/10/2015  . Atypical chest pain 02/28/2015  . Syncope 02/28/2015  . Orthostatic dizziness  02/25/2015  . Right leg pain 02/25/2015  . Right calf pain 02/14/2015  . Malaise and fatigue 02/14/2015  . 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 Smoking 11/12/2014  . History of stroke 10/24/2014  . Carotid disease, bilateral ([place]) 10/24/2014  . Orthostatic hypotension 10/24/2014  . Obstructive sleep apnea   . HLD (hyperlipidemia)   . Acute on chronic renal failure ([place])   . Renal cell carcinoma ([place])   . Metabolic encephalopathy   . Depression   . Anxiety state   . Noncompliance with medication regimen   . Carotid artery stenosis 10/08/2014  . Left sided numbness 09/26/2014  . Hyperreflexia 09/26/2014  . COLD (chronic obstructive lung disease) ([place]) 08/16/2014  . ILD (interstitial lung disease) ([place]) 08/16/2014  . Dizziness and giddiness 08/16/2014  . Other specified hypotension 08/16/2014  . Pulmonary edema   . Other emphysema ([place])   . Snoring 04/30/2014  . Fluid overload 02/22/2014  . Renal neoplasm 02/21/2014  . Preoperative cardiovascular examination 02/07/2014  . Renal mass, right 12/21/2013  . Coronary atherosclerosis of native coronary artery   . Substance abuse ([place])   . Hyperlipidemia   . Diabetes mellitus type 2, uncontrolled, with cardiovascular complications 63/07/6008  . Tobacco abuse 06/01/2012  . H/O medication  noncompliance 06/01/2012  . Cocaine abuse ([place]) 06/01/2012  . Essential hypertension, benign     Conditions to be addressed/monitored per PCP order:  as stated above.  Care Plan : General Plan of Care (Adult)  Updates made by Gayla Medicus, RN since 08/05/2020 12:00 AM    Problem: Coping Skills (General Plan of Care)     Problem: Quality of Life (General Plan of Care)     Goal: Quality of Life Maintained   Start Date: 08/01/2020  Expected End Date: 11/03/2020  This Visit's Progress: On track  Priority: High  Note:   Current Barriers:  . Care Coordination needs related to palliative care engagement. [name] to  independently consult with palliative care team.  Nurse Case Manager Clinical Goal(s):  [name] Over the next 14 days, patient will verbalize understanding of plan for palliative care follow up.  Interventions:  . Inter-disciplinary care team collaboration (see longitudinal plan of care) . Evaluation of current treatment plan related to palliative care  and patient's adherence to plan as established by provider. . Advised patient to anticipate call from [facility] to discuss plan. [name] with [name] regarding palliative care services.  Patient Goals/Self-Care Activities Over the next 14 days, patient will:  - receive follow up from [facility].  Follow Up Plan: The Managed Medicaid care management team will reach out to the patient again over the next 7 days.  The patient has been provided with contact information for the Managed Medicaid care management team and has been advised to call with any health related questions or concerns.             Follow Up:  Patient agrees to Care Plan and Follow-up.  Plan: The Managed Medicaid care management team will reach out to the patient again over the next 7 days. and The patient has been provided with contact information for the Managed Medicaid care management team and has been advised to call with any health related questions or concerns.  Date/time of next scheduled RN care management/care coordination outreach:  08/06/20 at 230.

## 2020-08-06 ENCOUNTER — Other Ambulatory Visit: Payer: Self-pay | Admitting: Obstetrics and Gynecology

## 2020-08-06 ENCOUNTER — Telehealth: Payer: Self-pay | Admitting: Nurse Practitioner

## 2020-08-06 ENCOUNTER — Other Ambulatory Visit: Payer: Self-pay

## 2020-08-06 NOTE — Telephone Encounter (Signed)
Spoke with patient and have scheduled an In-home Palliative f/u visit for 08/20/20 @ 9 AM.

## 2020-08-06 NOTE — Patient Instructions (Signed)
Visit Information  [name] was given information about Medicaid Managed Care team care coordination services as a part of their [place] Medicaid benefit. MAUREEN DUESING verbally consented to engagement with the [facility] Managed Care team.   For questions related to your [facility], please call: [phone] or visit the homepage here: [email or web address]  If you would like to schedule transportation through your [facility], please call the following number at least 2 days in advance of your appointment: [phone]  Patient verbalizes understanding of instructions provided today.   The Managed Medicaid care management team will reach out to the patient again over the next 30 days.  The patient has been provided with contact information for the Managed Medicaid care management team and has been advised to call with any health related questions or concerns.   ,Aida Raider RN, [place] Management Coordinator - Managed Medicaid High Risk [phone].  Following is a copy of your plan of care:  Patient Care Plan: General Plan of Care (Adult)    Problem Identified: Coping Skills (General Plan of Care)     Problem Identified: Quality of Life (General Plan of Care)     Goal: Quality of Life Maintained   Start Date: 08/01/2020  Expected End Date: 11/03/2020  Recent Progress: On track  Priority: High  Note:   Current Barriers:  . Care Coordination needs related to palliative care engagement. [name] to independently consult with palliative care team.  Nurse Case Manager Clinical Goal(s):  [name] Over the next 14 days, patient will verbalize understanding of plan for palliative care follow up.  Interventions:  . Inter-disciplinary care team collaboration (see longitudinal plan of care) . Evaluation of current treatment plan  related to palliative care  and patient's adherence to plan as established by provider. . Advised patient to anticipate call from [facility] to discuss plan. [name] with [name] regarding palliative care services.  Patient Goals/Self-Care Activities Over the next 14 days, patient will:  - receive follow up from [facility]. Update 08/06/20:  Patient is scheduled 08/20/20 at 0900 with [facility].  Follow Up Plan: The Managed Medicaid care management team will reach out to the patient again over the next 30 days.  The patient has been provided with contact information for the Managed Medicaid care management team and has been advised to call with any health related questions or concerns.

## 2020-08-06 NOTE — Patient Outreach (Signed)
Medicaid Managed Care   Nurse Care Manager Note  08/06/2020 Name:  [name] MRN:  [id number] DOB:  04/26/1956  [name] is an 65 y.o. year old male who is a primary patient of Roseanna RainbowWright, Kristen M, PA-C (Inactive).  [facility] was consulted for assistance with:    palliative care follow up.  [name] was given information about Medicaid Managed Care Coordination team services today. [name] agreed to services and verbal consent obtained.  Engaged with patient by telephone for follow up visit in response to provider referral for case management and/or care coordination services.   Assessments/Interventions:  Review of past medical history, allergies, medications, health status, including review of consultants reports, laboratory and other test data, was performed as part of comprehensive evaluation and provision of chronic care management services.  SDOH (Social Determinants of Health) assessments and interventions performed:   Care Plan  Allergies  Allergen Reactions  . Oxycodone Itching and Nausea Only    Pt is able to tolerate if taken with benadryl    Medications Reviewed Today    Reviewed by Danie Chandlerraft, Kimoni Pickerill G, RN (Registered Nurse) on 08/05/20 at 1535  Med List Status: <None>  Medication Order Taking? Sig Documenting Provider Last Dose Status Informant  ALPRAZolam (XANAX) 0.5 MG tablet [id number] No Take 1 tablet (0.5 mg total) by mouth 3 (three) times daily as needed for anxiety. Dhungel, Nishant, MD Taking Active Self  atorvastatin (LIPITOR) 20 MG tablet [id number] No Take 20 mg by mouth at bedtime. [provider] Taking Active Self  clopidogrel (PLAVIX) 75 MG tablet [id number] No TAKE ONE TABLET BY MOUTH EVERY DAY Jonelle SidleMcDowell, Samuel G, MD Taking Active   diltiazem (CARDIZEM CD) 180 MG 24 hr capsule [id number] No Take 1 capsule (180 mg total) by mouth daily. Jonelle SidleMcDowell, Samuel G, MD Taking Active   docusate sodium (COLACE) 100  MG capsule [id number] No Take 200 mg by mouth at bedtime.  [provider] Taking Active Self  esomeprazole (NEXIUM) 40 MG capsule [id number] No Take 40 mg by mouth daily at 12 noon. Pt only take as needed [provider] Taking Active   FLUoxetine (PROZAC) 40 MG capsule [id number] No Take 40 mg by mouth daily. Take with 10 mg capsule = 50 mg total [provider] Taking Active Self  gabapentin (NEURONTIN) 300 MG capsule [id number] No Take 900 mg by mouth 2 (two) times daily.  [provider] Taking Active Self  Glycopyrrolate-Formoterol (BEVESPI AEROSPHERE) 9-4.8 MCG/ACT AERO [id number] No Inhale 2 puffs into the lungs 2 (two) times daily. Kalman Shanamaswamy, Murali, MD Taking Active Self  insulin lispro (HUMALOG) 100 UNIT/ML cartridge [id number] No Inject 80 Units into the skin 3 (three) times daily with meals. [provider] Taking Active   insulin regular human CONCENTRATED (HUMULIN R U-500 KWIKPEN) 500 UNIT/ML Stephanie Coupkwikpen [id number] No Inject into the skin. [provider] Taking Active   isosorbide mononitrate (IMDUR) 60 MG 24 hr tablet [id number]  TAKE ONE TABLET BY MOUTH DAILY Jonelle SidleMcDowell, Samuel G, MD  Active   linagliptin (TRADJENTA) 5 MG TABS tablet [id number] No Take 5 mg by mouth daily. [provider] Taking Active Self  LORazepam (ATIVAN) 1 MG tablet [id number] No Take 0.5 mg by mouth at bedtime.  [provider] Taking Active Self  nitroGLYCERIN (NITROSTAT) 0.4 MG SL tablet [id number]  Place 1 tablet (0.4 mg total) under the tongue every 5 (five) minutes as needed for chest pain. IF 3 TABLETS ARE  NECESSARY CALL 911 Satira Sark, MD  Active   oxybutynin (DITROPAN-XL) 10 MG 24 hr tablet [id number] No Take 10 mg by mouth daily. [provider] Taking Active Self  oxyCODONE-acetaminophen (PERCOCET) 10-325 MG tablet [id number] No Take 1 tablet by mouth every 6 (six) hours as needed for pain. Dhungel, Nishant, MD Taking Active Self   potassium chloride SA (K-DUR,KLOR-CON) 20 MEQ tablet [id number] No Take 20 mEq by mouth 2 (two) times daily.  [provider] Taking Active Self  pregabalin (LYRICA) 25 MG capsule [id number] No Take 25 mg by mouth 2 (two) times daily. [provider] Taking Active   Probiotic Product (PROBIOTIC PO) [id number] No Take 1 capsule by mouth daily. [provider] Taking Active Self  tamsulosin (FLOMAX) 0.4 MG CAPS capsule [id number] No Take 0.4 mg by mouth daily.  [provider] Taking Active Self  torsemide (DEMADEX) 20 MG tablet [id number] No TAKE THREE TABLETS BY MOUTH TWICE DAILY Satira Sark, MD Taking Active   zolpidem (AMBIEN) 5 MG tablet [id number] No Take 5 mg by mouth at bedtime as needed. for insomnia [provider] Taking Active           Patient Active Problem List   Diagnosis Date Noted  . Sepsis ([place]) 05/19/2019  . Sepsis with hypotension ([place]) 12/23/2018  . Chronic respiratory failure with hypoxia ([place]) 12/23/2018  . UTI symptoms 11/08/2018  . Abdominal distention 11/08/2018  . Chest pain 11/08/2018  . Hyperglycemia 11/08/2018  . AKI (acute kidney injury) ([place]) 11/08/2018  . Acute metabolic encephalopathy 37/16/9678  . Polypharmacy 06/03/2018  . Diabetes mellitus due to underlying condition, uncontrolled, with hyperglycemia, with long-term current use of insulin ([place]) 06/03/2018  . Community acquired pneumonia 05/31/2018  . Acute on chronic respiratory failure with hypoxia ([place]) 05/31/2018  . Acute respiratory failure ([place])   . Decreased mobility and endurance 07/19/2015  . CKD (chronic kidney disease) stage 3, GFR 30-59 ml/min ([name]) 07/06/2015  . Acute on chronic respiratory failure ([place]) 07/05/2015  . COPD with acute exacerbation ([place]) 07/05/2015  . Interstitial lung disease ([place]) 04/10/2015  . Cough syncope 04/10/2015  . Dyspnea 04/10/2015  . Atypical chest pain 02/28/2015  . Syncope 02/28/2015  . Orthostatic dizziness  02/25/2015  . Right leg pain 02/25/2015  . Right calf pain 02/14/2015  . Malaise and fatigue 02/14/2015  . 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[place]) 11/12/2014  . Smoking 11/12/2014  . History of stroke 10/24/2014  . Carotid disease, bilateral ([place]) 10/24/2014  . Orthostatic hypotension 10/24/2014  . Obstructive sleep apnea   . HLD (hyperlipidemia)   . Acute on chronic renal failure ([place])   . Renal cell carcinoma ([place])   . Metabolic encephalopathy   . Depression   . Anxiety state   . Noncompliance with medication regimen   . Carotid artery stenosis 10/08/2014  . Left sided numbness 09/26/2014  . Hyperreflexia 09/26/2014  . COLD (chronic obstructive lung disease) ([place]) 08/16/2014  . ILD (interstitial lung disease) ([place]) 08/16/2014  . Dizziness and giddiness 08/16/2014  . Other specified hypotension 08/16/2014  . Pulmonary edema   . Other emphysema ([place])   . Snoring 04/30/2014  . Fluid overload 02/22/2014  . Renal neoplasm 02/21/2014  . Preoperative cardiovascular examination 02/07/2014  . Renal mass, right 12/21/2013  . Coronary atherosclerosis of native coronary artery   . Substance abuse ([place])   . Hyperlipidemia   . Diabetes mellitus type 2, uncontrolled, with cardiovascular complications 93/81/0175  . Tobacco abuse 06/01/2012  . H/O  medication noncompliance 06/01/2012  . Cocaine abuse ([place]) 06/01/2012  . Essential hypertension, benign     Conditions to be addressed/monitored per PCP order:  as stated above.  Care Plan : General Plan of Care (Adult)  Updates made by Gayla Medicus, RN since 08/06/2020 12:00 AM    Problem: Quality of Life (General Plan of Care)     Goal: Quality of Life Maintained   Start Date: 08/01/2020  Expected End Date: 11/03/2020  Recent Progress: On track  Priority: High  Note:   Current Barriers:  . Care Coordination needs related to palliative care engagement. [name] to independently consult with palliative care team.  Nurse Case  Manager Clinical Goal(s):  [name] Over the next 14 days, patient will verbalize understanding of plan for palliative care follow up.  Interventions:  . Inter-disciplinary care team collaboration (see longitudinal plan of care) . Evaluation of current treatment plan related to palliative care  and patient's adherence to plan as established by provider. . Advised patient to anticipate call from [facility] to discuss plan. [name] with [name] regarding palliative care services.  Patient Goals/Self-Care Activities Over the next 14 days, patient will:  - receive follow up from [facility]. Update 08/06/20:  Patient is scheduled 08/20/20 at 0900 with [facility].  Follow Up Plan: The Managed Medicaid care management team will reach out to the patient again over the next 30 days.  The patient has been provided with contact information for the Managed Medicaid care management team and has been advised to call with any health related questions or concerns.     Follow Up:  Patient agrees to Care Plan and Follow-up.  Plan: The Managed Medicaid care management team will reach out to the patient again over the next 30 days. and The patient has been provided with contact information for the Managed Medicaid care management team and has been advised to call with any health related questions or concerns.  Date/time of next scheduled RN care management/care coordination outreach:  09/06/20 at 1030

## 2020-08-15 ENCOUNTER — Other Ambulatory Visit: Payer: Self-pay | Admitting: Obstetrics and Gynecology

## 2020-08-15 NOTE — Patient Instructions (Signed)
Hi [name], sorry we missed you today - as a part of your Medicaid benefit, you are eligible for care management and care coordination services at no cost or copay. I was unable to reach you by phone today but would be happy to help you with your health related needs. Please feel free to call me at [phone]  A member of the Managed Medicaid care management team will reach out to you again over the next 7 days.   Aida Raider RN, BSN [place]  [facility] Curator - Managed Medicaid High Risk [phone].

## 2020-08-15 NOTE — Patient Outreach (Addendum)
Care Coordination  08/15/2020  [name] Apr 26, 1956 [id number]    Medicaid Managed Care   Unsuccessful Outreach Note  08/15/2020 Name: [name] MRN: [id number] DOB: 08-Aug-1955  Referred by: [name], PA-C (Inactive) Reason for referral : High Risk Managed Medicaid (Unsuccessful telephone outreach)   An unsuccessful telephone outreach was attempted today. The patient was referred to the case management team for assistance with care management and care coordination.   Follow Up Plan: The patient has been provided with contact information for the care management team and has been advised to call with any health related questions or concerns.  The care management team will reach out to the patient again over the next 7 days.   Aida Raider RN, BSN [place]  [facility]ator - Managed Medicaid High Risk [phone].

## 2020-08-20 ENCOUNTER — Other Ambulatory Visit: Payer: Medicaid Other | Admitting: Nurse Practitioner

## 2020-08-20 ENCOUNTER — Other Ambulatory Visit: Payer: Self-pay

## 2020-08-20 ENCOUNTER — Telehealth: Payer: Self-pay | Admitting: Nurse Practitioner

## 2020-08-20 DIAGNOSIS — Z1159 Encounter for screening for other viral diseases: Secondary | ICD-10-CM | POA: Diagnosis not present

## 2020-08-20 DIAGNOSIS — J449 Chronic obstructive pulmonary disease, unspecified: Secondary | ICD-10-CM | POA: Diagnosis not present

## 2020-09-06 ENCOUNTER — Other Ambulatory Visit: Payer: Self-pay | Admitting: Obstetrics and Gynecology

## 2020-09-06 NOTE — Patient Instructions (Signed)
Visit Information  [name]  - as a part of your Medicaid benefit, you are eligible for care management and care coordination services at no cost or copay. I was unable to reach you by phone today but would be happy to help you with your health related needs. Please feel free to call me at [phone].  A member of the Managed Medicaid care management team will reach out to you again over the next 7 days.   Aida Raider RN, BSN [place]  [facility]ator - Managed Medicaid High Risk [phone].

## 2020-09-06 NOTE — Patient Outreach (Signed)
Care Coordination  09/06/2020  [name] 16-Feb-1956 [id number]    Medicaid Managed Care   Unsuccessful Outreach Note  09/06/2020 Name: [name] MRN: [id number] DOB: 12-15-1955  Referred by: [name], PA-C (Inactive) Reason for referral : High Risk Managed Medicaid (Unsuccessful telephone outreach)   A second unsuccessful telephone outreach was attempted today. The patient was referred to the case management team for assistance with care management and care coordination.   Follow Up Plan: A member of the Managed  Medicaid  care management team will reach out to the patient again over the next 7 days.   Aida Raider RN, BSN [place]  [facility]ator - Managed Medicaid High Risk [phone].

## 2020-09-09 ENCOUNTER — Other Ambulatory Visit: Payer: Self-pay | Admitting: Cardiology

## 2020-09-10 ENCOUNTER — Other Ambulatory Visit: Payer: Self-pay

## 2020-09-10 ENCOUNTER — Other Ambulatory Visit: Payer: Self-pay | Admitting: Obstetrics and Gynecology

## 2020-09-10 NOTE — Patient Instructions (Signed)
Visit Information  [name] was given information about Medicaid Managed Care team care coordination services as a part of their [place] Medicaid benefit. [name] verbally consented to engagement with the [facility] Managed Care team.   For questions related to your [facility], please call: [phone] or visit the homepage here: [email or web address]  If you would like to schedule transportation through your [facility], please call the following number at least 2 days in advance of your appointment: [phone].   [name] - following are the goals we discussed in your visit today:  Goals Addressed            This Visit's Progress   . [place] My Quality of Life       Timeframe:  Long-Range Goal Priority:  High Start Date:     08/01/20                        Expected End Date:  12/08/20                    Follow Up Date 10/08/20   Anticipate call from [facility] to discuss Palliative Care. [phone]. Update 09/10/20:  Patient had appointment with [facility] scheduled for 09-17-20.  Patient's brother died 09-05-20 so appointment rescheduled to 10/02/20 at 3pm.      Patient verbalizes understanding of instructions provided today.   The Managed Medicaid care management team will reach out to the patient again over the next 30 days.  The patient has been provided with contact information for the Managed Medicaid care management team and has been advised to call with any health related questions or concerns.   [name], RN  Following is a copy of your plan of care:  Patient Care Plan: General Plan of Care (Adult)    Problem Identified: Coping Skills (General Plan of Care)     Problem Identified: Quality of Life (General Plan of Care)     Long-Range Goal: Quality of Life Maintained   Start Date: 08/01/2020  Expected End Date: 11/03/2020   Recent Progress: On track  Priority: High  Note:   Current Barriers:  . Care Coordination needs related to palliative care engagement. [name] to independently consult with palliative care team.  Nurse Case Manager Clinical Goal(s):  [name] Over the next 14 days, patient will verbalize understanding of plan for palliative care follow up.  Interventions:  . Inter-disciplinary care team collaboration (see longitudinal plan of care) . Evaluation of current treatment plan related to palliative care  and patient's adherence to plan as established by provider. . Advised patient to anticipate call from [facility] to discuss plan. [name] with [name] regarding palliative care services.  Patient Goals/Self-Care Activities Over the next 14 days, patient will:  - receive follow up from [facility]. Update 08/06/20:  Patient is scheduled 17-Sep-2020 at 0900 with [facility]. Update 09/10/20:  Patient rescheduled appointment to 10/02/20 at 3pm.  Patient's brother died 05-Sep-2020.  Follow Up Plan: The Managed Medicaid care management team will reach out to the patient again over the next 30 days.  The patient has been provided with contact information for the Managed Medicaid care management team and has been advised to call with any health related questions or concerns.

## 2020-09-10 NOTE — Patient Outreach (Signed)
Medicaid Managed Care   Nurse Care Manager Note  09/10/2020 Name:  [name] MRN:  [id number] DOB:  06-17-1956  [name] is an 65 y.o. year old male who is a primary patient of [name], PA-C (Inactive).  The [facility] Managed Care Coordination team was consulted for assistance with:    chronic healthcare management needs-patient is being followed by palliative care at [facility].  Mr. [name] was given information about Medicaid Managed Care Coordination team services today. [name] agreed to services and verbal consent obtained.  Engaged with patient by telephone for follow up visit in response to provider referral for case management and/or care coordination services.   Assessments/Interventions:  Review of past medical history, allergies, medications, health status, including review of consultants reports, laboratory and other test data, was performed as part of comprehensive evaluation and provision of chronic care management services.  SDOH (Social Determinants of Health) assessments and interventions performed:   Care Plan  Allergies  Allergen Reactions  . Oxycodone Itching and Nausea Only    Pt is able to tolerate if taken with benadryl    Medications Reviewed Today    Reviewed by Gayla Medicus, RN (Registered Nurse) on 09/10/20 at 1051  Med List Status: <None>  Medication Order Taking? Sig Documenting Provider Last Dose Status Informant  ALPRAZolam (XANAX) 0.5 MG tablet [id number] No Take 1 tablet (0.5 mg total) by mouth 3 (three) times daily as needed for anxiety. Dhungel, Nishant, MD Taking Active Self  atorvastatin (LIPITOR) 20 MG tablet [id number] No Take 20 mg by mouth at bedtime. [provider] Taking Active Self  clopidogrel (PLAVIX) 75 MG tablet [id number] No TAKE ONE TABLET BY MOUTH EVERY DAY Satira Sark, MD Taking Active   diltiazem (CARDIZEM CD) 180 MG 24 hr capsule [id number] No Take 1 capsule (180 mg total) by mouth daily.  Satira Sark, MD Taking Active   docusate sodium (COLACE) 100 MG capsule [id number] No Take 200 mg by mouth at bedtime.  [provider] Taking Active Self  esomeprazole (NEXIUM) 40 MG capsule [id number] No Take 40 mg by mouth daily at 12 noon. Pt only take as needed [provider] Taking Active   FLUoxetine (PROZAC) 40 MG capsule [id number] No Take 40 mg by mouth daily. Take with 10 mg capsule = 50 mg total [provider] Taking Active Self  gabapentin (NEURONTIN) 300 MG capsule [id number] No Take 900 mg by mouth 2 (two) times daily.  [provider] Taking Active Self  Glycopyrrolate-Formoterol (BEVESPI AEROSPHERE) 9-4.8 MCG/ACT AERO [id number] No Inhale 2 puffs into the lungs 2 (two) times daily. Brand Males, MD Taking Active Self  insulin lispro (HUMALOG) 100 UNIT/ML cartridge [id number] No Inject 80 Units into the skin 3 (three) times daily with meals. [provider] Taking Active   insulin regular human CONCENTRATED (HUMULIN R U-500 KWIKPEN) 500 UNIT/ML Claiborne Rigg [id number] No Inject into the skin. [provider] Taking Active   isosorbide mononitrate (IMDUR) 60 MG 24 hr tablet [id number]  TAKE ONE TABLET BY MOUTH DAILY Satira Sark, MD  Active   linagliptin (TRADJENTA) 5 MG TABS tablet [id number] No Take 5 mg by mouth daily. [provider] Taking Active Self  LORazepam (ATIVAN) 1 MG tablet [id number] No Take 0.5 mg by mouth at bedtime.  [provider] Taking Active Self  nitroGLYCERIN (NITROSTAT) 0.4 MG SL tablet [id number]  Place 1 tablet (0.4 mg total) under the tongue every 5 (five) minutes as  needed for chest pain. IF 3 TABLETS ARE NECESSARY CALL 911 Satira Sark, MD  Active   oxybutynin (DITROPAN-XL) 10 MG 24 hr tablet [id number] No Take 10 mg by mouth daily. [provider] Taking Active Self  oxyCODONE-acetaminophen (PERCOCET) 10-325 MG tablet [id number] No Take 1 tablet by mouth every 6 (six)  hours as needed for pain. Dhungel, Nishant, MD Taking Active Self  potassium chloride SA (K-DUR,KLOR-CON) 20 MEQ tablet [id number] No Take 20 mEq by mouth 2 (two) times daily.  [provider] Taking Active Self  pregabalin (LYRICA) 25 MG capsule [id number] No Take 25 mg by mouth 2 (two) times daily. [provider] Taking Active   Probiotic Product (PROBIOTIC PO) [id number] No Take 1 capsule by mouth daily. [provider] Taking Active Self  tamsulosin (FLOMAX) 0.4 MG CAPS capsule [id number] No Take 0.4 mg by mouth daily.  [provider] Taking Active Self  torsemide (DEMADEX) 20 MG tablet [id number] No TAKE THREE TABLETS BY MOUTH TWICE DAILY Satira Sark, MD Taking Active   zolpidem (AMBIEN) 5 MG tablet [id number] No Take 5 mg by mouth at bedtime as needed. for insomnia [provider] Taking Active           Patient Active Problem List   Diagnosis Date Noted  . Sepsis ([name]) 05/19/2019  . Sepsis with hypotension ([name]) 12/23/2018  . Chronic respiratory failure with hypoxia ([name]) 12/23/2018  . UTI symptoms 11/08/2018  . Abdominal distention 11/08/2018  . Chest pain 11/08/2018  . Hyperglycemia 11/08/2018  . AKI (acute kidney injury) ([name]) 11/08/2018  . Acute metabolic encephalopathy 48/88/9169  . Polypharmacy 06/03/2018  . Diabetes mellitus due to underlying condition, uncontrolled, with hyperglycemia, with long-term current use of insulin ([name]) 06/03/2018  . Community acquired pneumonia 05/31/2018  . Acute on chronic respiratory failure with hypoxia ([name]) 05/31/2018  . Acute respiratory failure ([name])   . Decreased mobility and endurance 07/19/2015  . CKD (chronic kidney disease) stage 3, GFR 30-59 ml/min ([name]) 07/06/2015  . Acute on chronic respiratory failure ([name]) 07/05/2015  . COPD with acute exacerbation ([name]) 07/05/2015  . Interstitial lung disease ([name]) 04/10/2015  . Cough syncope 04/10/2015  . Dyspnea 04/10/2015  . Atypical  chest pain 02/28/2015  . Syncope 02/28/2015  . Orthostatic dizziness 02/25/2015  . Right leg pain 02/25/2015  . Right calf pain 02/14/2015  . Malaise and fatigue 02/14/2015  . 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[place]) 11/12/2014  . Smoking 11/12/2014  . History of stroke 10/24/2014  . Carotid disease, bilateral ([place]) 10/24/2014  . Orthostatic hypotension 10/24/2014  . Obstructive sleep apnea   . HLD (hyperlipidemia)   . Acute on chronic renal failure ([place])   . Renal cell carcinoma ([place])   . Metabolic encephalopathy   . Depression   . Anxiety state   . Noncompliance with medication regimen   . Carotid artery stenosis 10/08/2014  . Left sided numbness 09/26/2014  . Hyperreflexia 09/26/2014  . COLD (chronic obstructive lung disease) ([place]) 08/16/2014  . ILD (interstitial lung disease) ([place]) 08/16/2014  . Dizziness and giddiness 08/16/2014  . Other specified hypotension 08/16/2014  . Pulmonary edema   . Other emphysema ([place])   . Snoring 04/30/2014  . Fluid overload 02/22/2014  . Renal neoplasm 02/21/2014  . Preoperative cardiovascular examination 02/07/2014  . Renal mass, right 12/21/2013  . Coronary atherosclerosis of native coronary artery   . Substance abuse ([place])   . Hyperlipidemia   . Diabetes mellitus type 2, uncontrolled, with cardiovascular complications 45/09/8880  .  Tobacco abuse 06/01/2012  . H/O medication noncompliance 06/01/2012  . Cocaine abuse ([place]) 06/01/2012  . Essential hypertension, benign     Conditions to be addressed/monitored per PCP order:  chronic healthcare management needs-patient is being followed by palliative care at [facility].  multiple medical problems including COPD, obstructive sleep apnea, interstitial lung disease with emphysema,pulmonary edema, chronic respiratory failure on high flow oxygen,past substance abuse,chronic pain on narcotics,polypharmacy,type 2 diabetes, essential hypertension, orthostatic hypotension, coronary  arthrosclerosis, metabolic encephalopathy, chronic kidney disease stage III, renal cell carcinoma  Care Plan : General Plan of Care (Adult)  Updates made by Gayla Medicus, RN since 09/10/2020 12:00 AM    Problem: Quality of Life (General Plan of Care)     Long-Range Goal: Quality of Life Maintained   Start Date: 08/01/2020  Expected End Date: 11/03/2020  Recent Progress: On track  Priority: High  Note:   Current Barriers:  . Care Coordination needs related to palliative care engagement. [name] to independently consult with palliative care team.  Nurse Case Manager Clinical Goal(s):  [name] Over the next 14 days, patient will verbalize understanding of plan for palliative care follow up.  Interventions:  . Inter-disciplinary care team collaboration (see longitudinal plan of care) . Evaluation of current treatment plan related to palliative care  and patient's adherence to plan as established by provider. . Advised patient to anticipate call from [facility] to discuss plan. [name] with [name] regarding palliative care services.  Patient Goals/Self-Care Activities Over the next 14 days, patient will:  - receive follow up from [facility]. Update 08/06/20:  Patient is scheduled 08/20/20 at 0900 with [facility]. Update 09/10/20:  Patient rescheduled appointment to 10/02/20 at 3pm.  Patient's brother died 24-Aug-2020.  Follow Up Plan: The Managed Medicaid care management team will reach out to the patient again over the next 30 days.  The patient has been provided with contact information for the Managed Medicaid care management team and has been advised to call with any health related questions or concerns.     Follow Up:  Patient agrees to Care Plan and Follow-up.  Plan: The Managed Medicaid care management team will reach out to the patient again over the next 30 days. and The patient has been provided with contact information for the Managed Medicaid care management team  and has been advised to call with any health related questions or concerns.  Date/time of next scheduled RN care management/care coordination outreach:  10/08/20 at 1030.

## 2020-09-17 DIAGNOSIS — J449 Chronic obstructive pulmonary disease, unspecified: Secondary | ICD-10-CM | POA: Diagnosis not present

## 2020-09-20 ENCOUNTER — Other Ambulatory Visit: Payer: Self-pay

## 2020-09-20 ENCOUNTER — Ambulatory Visit (INDEPENDENT_AMBULATORY_CARE_PROVIDER_SITE_OTHER): Payer: Medicare Other | Admitting: Podiatry

## 2020-09-20 DIAGNOSIS — E1165 Type 2 diabetes mellitus with hyperglycemia: Secondary | ICD-10-CM | POA: Diagnosis not present

## 2020-09-20 DIAGNOSIS — IMO0002 Reserved for concepts with insufficient information to code with codable children: Secondary | ICD-10-CM

## 2020-09-20 DIAGNOSIS — M79675 Pain in left toe(s): Secondary | ICD-10-CM | POA: Diagnosis not present

## 2020-09-20 DIAGNOSIS — L84 Corns and callosities: Secondary | ICD-10-CM | POA: Diagnosis not present

## 2020-09-20 DIAGNOSIS — B351 Tinea unguium: Secondary | ICD-10-CM

## 2020-09-20 DIAGNOSIS — M79674 Pain in right toe(s): Secondary | ICD-10-CM | POA: Diagnosis not present

## 2020-09-20 DIAGNOSIS — E1149 Type 2 diabetes mellitus with other diabetic neurological complication: Secondary | ICD-10-CM

## 2020-09-20 NOTE — Progress Notes (Signed)
Subjective:   Patient ID: [name], male   DOB: 65 y.o.   MRN: [id number]   HPI 65 year old male presents the office today for concerns of thick, discolored toenails that he cannot trim himself.  He states that his brother used to trim them however recently passed away.  He is also diabetic his last A1c was 13.5.  He states that at times his blood sugar will be in the 800s.  He has lung cancer he cannot bend over and trim his toenails.  Denies any open sores.  He has no other concerns today.   Review of Systems  All other systems reviewed and are negative.  Past Medical History:  Diagnosis Date  . Anxiety   . Arthritis   . Cholecystitis, acute 12/20/2013   Lap chole 6/5  . Cocaine abuse ([place])   . COPD (chronic obstructive pulmonary disease) ([place])   . Coronary atherosclerosis of native coronary artery    a. 03/09/2013 Cath/PCI: LM nl, LAD: 50p, 65m (2.5x16 promus DES), LCX nl, OM1 min irregs, LPL/LPDA diff dzs, RCA nondom, mod diff dzs, EF 55%.  . Depression   . Essential hypertension   . GERD (gastroesophageal reflux disease)   . Headache   . Hepatitis Late 1970s  . History of nephrolithiasis   . History of pneumonia   . History of stroke    Right MCA distribution, residual left-sided weakness  . Hyperlipidemia   . Noncompliance   . Renal cell carcinoma [facility])    Status post radical right nephrectomy August 2015  . Sleep apnea    On CPAP, 4L O2 no cpap at home yet  . Type 2 diabetes mellitus ([place]) 2011    Past Surgical History:  Procedure Laterality Date  . APPENDECTOMY  1970's  . CHOLECYSTECTOMY N/A 12/22/2013   Procedure: LAPAROSCOPIC CHOLECYSTECTOMY ;  Surgeon: [name], MD;  Location: [place];  Service: General;  Laterality: N/A;  . ENDARTERECTOMY Right 10/08/2014   Procedure: Right ENDARTERECTOMY CAROTID;  Surgeon: [name], MD;  Location: [place];  Service: Vascular;  Laterality: Right;  . LAPAROSCOPIC LYSIS OF ADHESIONS  02/21/2014   Procedure: LAPAROSCOPIC  LYSIS OF ADHESIONS EXTINSIVE;  Surgeon: [name], MD;  Location: [facility];  Service: Urology;;  . LEFT HEART CATHETERIZATION WITH CORONARY ANGIOGRAM N/A 06/02/2012   Procedure: LEFT HEART CATHETERIZATION WITH CORONARY ANGIOGRAM;  Surgeon: [name], MD;  Location: [facility] CATH LAB;  Service: Cardiovascular;  Laterality: N/A;  . LEFT HEART CATHETERIZATION WITH CORONARY ANGIOGRAM N/A 03/09/2013   Procedure: LEFT HEART CATHETERIZATION WITH CORONARY ANGIOGRAM;  Surgeon: [name], MD;  Location: [facility] CATH LAB;  Service: Cardiovascular;  Laterality: N/A;  . LEFT HEART CATHETERIZATION WITH CORONARY ANGIOGRAM N/A 12/20/2013   Procedure: LEFT HEART CATHETERIZATION WITH CORONARY ANGIOGRAM;  Surgeon: [name], MD;  Location: [facility] CATH LAB;  Service: Cardiovascular;  Laterality: N/A;  . LUNG BIOPSY Left 05/18/2014   Procedure: LUNG BIOPSY left upper lobe & left lower lobe;  Surgeon: [name], MD;  Location: [place];  Service: Thoracic;  Laterality: Left;  . PATCH ANGIOPLASTY Right 10/08/2014   Procedure: PATCH ANGIOPLASTY Right Carotid;  Surgeon: [name], MD;  Location: [place];  Service: Vascular;  Laterality: Right;  . ROBOT ASSISTED LAPAROSCOPIC NEPHRECTOMY Right 02/21/2014   Procedure: ROBOTIC ASSISTED LAPAROSCOPIC RIGHT NEPHRECTOMY ;  Surgeon: [name], MD;  Location: [facility];  Service: Urology;  Laterality: Right;  [name] VIDEO ASSISTED THORACOSCOPY Left 05/18/2014   Procedure:  LEFT VIDEO ASSISTED THORACOSCOPY;  Surgeon: [name], MD;  Location: [place];  Service: Thoracic;  Laterality: Left;  [name] VIDEO BRONCHOSCOPY  05/18/2014   Procedure: VIDEO BRONCHOSCOPY;  Surgeon: [name], MD;  Location: [place];  Service: Thoracic;;     Current Outpatient Medications:  .  acetaminophen (TYLENOL) 325 MG tablet, 2 tablets as needed, Disp: , Rfl:  .  budesonide (PULMICORT) 1 MG/2ML nebulizer solution, 4 ml, Disp: , Rfl:  .  formoterol (PERFOROMIST) 20 MCG/2ML  nebulizer solution, 2 ml, Disp: , Rfl:  .  moxifloxacin (AVELOX) 400 MG tablet, 1 tablet, Disp: , Rfl:  .  ALPRAZolam (XANAX) 0.5 MG tablet, Take 1 tablet (0.5 mg total) by mouth 3 (three) times daily as needed for anxiety., Disp: , Rfl: 0 .  atorvastatin (LIPITOR) 20 MG tablet, Take 20 mg by mouth at bedtime., Disp: , Rfl: 1 .  budesonide-formoterol (SYMBICORT) 160-4.5 MCG/ACT inhaler, Inhale 2 puffs into the lungs 2 (two) times daily., Disp: , Rfl:  .  clopidogrel (PLAVIX) 75 MG tablet, TAKE ONE TABLET BY MOUTH EVERY DAY, Disp: 90 tablet, Rfl: 1 .  diltiazem (CARDIZEM CD) 180 MG 24 hr capsule, Take 1 capsule (180 mg total) by mouth daily., Disp: 90 capsule, Rfl: 2 .  diphenhydrAMINE (BENADRYL) 25 mg capsule, 1 capsule as needed, Disp: , Rfl:  .  docusate sodium (COLACE) 100 MG capsule, Take 200 mg by mouth at bedtime. , Disp: , Rfl:  .  esomeprazole (NEXIUM) 40 MG capsule, Take 40 mg by mouth daily at 12 noon. Pt only take as needed, Disp: , Rfl:  .  FLUoxetine (PROZAC) 40 MG capsule, Take 40 mg by mouth daily. Take with 10 mg capsule = 50 mg total, Disp: , Rfl:  .  gabapentin (NEURONTIN) 300 MG capsule, Take 900 mg by mouth 2 (two) times daily. , Disp: , Rfl:  .  Glycopyrrolate-Formoterol (BEVESPI AEROSPHERE) 9-4.8 MCG/ACT AERO, Inhale 2 puffs into the lungs 2 (two) times daily., Disp: 1 Inhaler, Rfl: 11 .  insulin aspart (NOVOLOG FLEXPEN) 100 UNIT/ML FlexPen, 30 units 5-10 min before meals, Disp: , Rfl:  .  insulin lispro (HUMALOG) 100 UNIT/ML cartridge, Inject 80 Units into the skin 3 (three) times daily with meals., Disp: , Rfl:  .  insulin regular human CONCENTRATED (HUMULIN R U-500 KWIKPEN) 500 UNIT/ML kwikpen, Inject into the skin., Disp: , Rfl:  .  isosorbide mononitrate (IMDUR) 60 MG 24 hr tablet, TAKE ONE TABLET BY MOUTH DAILY, Disp: 90 tablet, Rfl: 1 .  LANTUS 100 UNIT/ML injection, SMARTSIG:115 Unit(s) SUB-Q Twice Daily, Disp: , Rfl:  .  linagliptin (TRADJENTA) 5 MG TABS tablet, Take  5 mg by mouth daily., Disp: , Rfl:  .  LORazepam (ATIVAN) 1 MG tablet, Take 0.5 mg by mouth at bedtime. , Disp: , Rfl:  .  nitroGLYCERIN (NITROSTAT) 0.4 MG SL tablet, Place 1 tablet (0.4 mg total) under the tongue every 5 (five) minutes as needed for chest pain. IF 3 TABLETS ARE NECESSARY CALL 911, Disp: 25 tablet, Rfl: 3 .  ondansetron (ZOFRAN) 4 MG tablet, Take 4 mg by mouth every 6 (six) hours as needed., Disp: , Rfl:  .  ondansetron (ZOFRAN) 8 MG tablet, 1 tablet, Disp: , Rfl:  .  oxybutynin (DITROPAN-XL) 10 MG 24 hr tablet, Take 10 mg by mouth daily., Disp: , Rfl: 11 .  oxyCODONE-acetaminophen (PERCOCET) 10-325 MG tablet, Take 1 tablet by mouth every 6 (six) hours as needed for pain., Disp: 30  tablet, Rfl: 0 .  potassium chloride SA (K-DUR,KLOR-CON) 20 MEQ tablet, Take 20 mEq by mouth 2 (two) times daily. , Disp: , Rfl:  .  pregabalin (LYRICA) 25 MG capsule, Take 25 mg by mouth 2 (two) times daily., Disp: , Rfl:  .  Probiotic Product (PROBIOTIC PO), Take 1 capsule by mouth daily., Disp: , Rfl:  .  tamsulosin (FLOMAX) 0.4 MG CAPS capsule, Take 0.4 mg by mouth daily. , Disp: , Rfl:  .  torsemide (DEMADEX) 20 MG tablet, TAKE THREE TABLETS BY MOUTH TWICE DAILY, Disp: 540 tablet, Rfl: 1 .  zolpidem (AMBIEN) 5 MG tablet, Take 5 mg by mouth at bedtime as needed. for insomnia, Disp: , Rfl:   Allergies  Allergen Reactions  . Lisinopril Other (See Comments)  . Oxycodone Itching and Nausea Only    Pt is able to tolerate if taken with benadryl          Objective:  Physical Exam  General: AAO x3, NAD  Dermatological: Nails are hypertrophic, dystrophic, brittle, discolored, elongated 10. No surrounding redness or drainage. Tenderness nails 1-5 bilaterally.  Hyperkeratotic lesion left foot submetatarsal 1.  Old blister present upon debridement there is no underlying ulceration drainage or any signs of infection.  No open lesions or pre-ulcerative lesions are identified today.   Vascular:  Dorsalis Pedis artery and Posterior Tibial artery pedal pulses are 2/4 bilateral with immedate capillary fill time. There is no pain with calf compression, swelling, warmth, erythema.   Neruologic: Sensation absent with Thornell Mule monofilament.  Musculoskeletal: Hammertoes present.  Muscular strength 5/5 in all groups tested bilateral.      Assessment:   Symptomatic onychomycosis, uncontrolled diabetes with neuropathy     Plan:  -Treatment options discussed including all alternatives, risks, and complications -Etiology of symptoms were discussed -Nails debrided 10 without complications or bleeding. -Debrided hyperkeratotic lesion with any complications or bleeding.  Recommend moisturizer and monitor closely for any skin breakdown. -Daily foot inspection -Follow-up in 2 months or sooner if any problems arise. In the meantime, encouraged to call the office with any questions, concerns, change in symptoms.   Celesta Gentile, DPM

## 2020-09-24 ENCOUNTER — Other Ambulatory Visit: Payer: Self-pay

## 2020-09-24 ENCOUNTER — Ambulatory Visit
Admission: RE | Admit: 2020-09-24 | Discharge: 2020-09-24 | Disposition: A | Discharge status: Home / Self Care | Payer: Medicare Other | Source: Ambulatory Visit | Attending: Primary Care | Admitting: Primary Care

## 2020-09-24 DIAGNOSIS — R0602 Shortness of breath: Secondary | ICD-10-CM | POA: Diagnosis not present

## 2020-09-24 DIAGNOSIS — J849 Interstitial pulmonary disease, unspecified: Secondary | ICD-10-CM

## 2020-09-25 DIAGNOSIS — H6122 Impacted cerumen, left ear: Secondary | ICD-10-CM | POA: Diagnosis not present

## 2020-09-25 DIAGNOSIS — S9032XA Contusion of left foot, initial encounter: Secondary | ICD-10-CM | POA: Diagnosis not present

## 2020-10-02 ENCOUNTER — Other Ambulatory Visit: Payer: Medicare Other | Admitting: Nurse Practitioner

## 2020-10-02 ENCOUNTER — Other Ambulatory Visit: Payer: Self-pay

## 2020-10-02 DIAGNOSIS — I959 Hypotension, unspecified: Secondary | ICD-10-CM

## 2020-10-02 DIAGNOSIS — Z515 Encounter for palliative care: Secondary | ICD-10-CM | POA: Diagnosis not present

## 2020-10-02 NOTE — Progress Notes (Signed)
[facility] Consult Note Telephone: [phone]  Fax: [phone]  PATIENT NAME: [name] [address] [name] [address] [id number] (home)  DOB: [birth year] MRN: [id number]  PRIMARY CARE PROVIDER:    Carolee Rota, NP Phone [phone] Fax [phone]  REFERRING PROVIDER:   Carolee Rota, NP  RESPONSIBLE PARTY:   Extended Emergency Contact Information Primary Emergency Contact: [name],Kevin Address: [address]          [place], [place] [address] [name] of [place] Phone: [phone] Mobile Phone: [phone] Relation: Mother Secondary Emergency Contact: [name] [place] of [place] Phone: [phone] Mobile Phone: [phone] Relation: Brother  I met face to face with patient in home. Patient mother present in home during visit.   ASSESSMENT AND RECOMMENDATIONS:   Advance Care Planning: Patient's goal of care is comfort. He has a signed DNR and MOST form in home, copy on [facility] EMR.  Symptom Management:  Dark stool: Patient report stool incontinence x 2 during sleep in the last 2 weeks, last occurrence was 3 days ago, report unawareness of it happening. Described stool as dark, watery and malodorous, denied any antibiotic use in the last 3 months. Patient denied abdominal pain, denied bloating, denied nausea, denied vomiting, denied rectal bleed. Patient report taking Imodium when it occurs with relief. Recommendation: patient many need to be evaluated for occult stool or GI referral if symptoms persist.  Falls: Patient report falls related to hypotension, report falling 3 times in the last month, no report of acute injury, report feeling lightheadedness and legs giving out on him. Report recent SBP range of 80 to low 100s. Patient has a history of orthostatic BP, his current medication for BP control include Isosorbide mononitro 60mg  daily, Diltiazem 180mg ,  Torsemide 20mg . Patient denied chest pain, denied palpitation, denied syncope. Voiced no other concerns. Recommendation: Recommend medication adjustment, consider decreasing dose of Imdur. Phone called made to patient's PCP office, unable to reach his new PCP Carolee Rota, NP. [place] with visit findings left with nurse Larene Beach.    Follow up Palliative Care Visit: Palliative care will continue to follow for complex decision making and symptom management. Return in about 4 weeks or prn.  Family /Caregiver/Community Supports: Patient lives at home with his mother. He has a paid care giver 3 hr a day Monday thru Friday.  Cognitive / Functional decline: patient awake, alert and an coherent, able to make his own decisions. He is independent with his ADLs, walks independently without assistive device within his home, uses assistive device when outside his home.  I spent 30 minutes providing this consultation, time includes time spent with patient, chart review, provider coordination, and documentation. More than 50% of the time in this consultation was spent counseling and coordinating communication.   CHIEF COMPLAINT: Dark loose stools  History obtained from review of EMR and discussion with patient. Records reviewed and summarized bellow.  HISTORY OF PRESENT ILLNESS:Kevin W Knightis a 65 y.o.year old malewith multiple medical problems including COPD, obstructive sleep apnea, interstitial lung disease with emphysema,pulmonary edema, chronic respiratory failure on high flow oxygen,past substance abuse,chronic pain on narcotics,polypharmacy,type 2 diabetes, essential hypertension, orthostatic hypotension, coronary arthrosclerosis, metabolic encephalopathy, chronic kidney disease stage III, renal cell carcinoma.Palliative Care was asked to follow this patient to help address advance care planning, goals of care and symptom management. This is a follow up visit.  CODE STATUS: DNR  PPS:  50%  HOSPICE ELIGIBILITY/DIAGNOSIS: TBD  ROS/staff/patient Constitutional: denies  fever, denies chills, endorsed chronic fatgue EYES: denies vision changes ENMT: denies dysphagia Cardiovascular: denies chest pain, denied palpitation Pulmonary: denies cough, denies increased SOB Abdomen: endorses fair appetite, denies constipation, endorses incontinence of bowel GU: denies dysuria, denies incontinence of urine MSK: endorses ROM limitations, report falls  Skin: denies rashes or wounds Neurological: endorses weakness, denies uncontrolled pain, denies insomnia Psych: Endorses positive mood Heme/lymph/immuno: denies bruises, abnormal bleeding   Physical Exam: Vital signs: 106/74, P 82, RR 18, 96% on 6L oxygen via nasal cannula General: frail appearing, cooperative, NAD EYES: anicteric sclera, lids intact, no discharge  ENMT: intact hearing, oral mucous membranes moist CV:  no LE edema Pulmonary: no increased work of breathing, no cough, wheezes auscultated Abdomen: no ascites GU: deferred MSK: no contractures of LE, ambulatory Skin: warm and dry, no rashes or wounds on visible skin Neuro: Generalized weakness, A and O x 4 Psych: non-anxious affect today Hem/lymph/immuno: no widespread bruising   PAST MEDICAL HISTORY:  Past Medical History:  Diagnosis Date  . Anxiety   . Arthritis   . Cholecystitis, acute 12/20/2013   Lap chole 6/5  . Cocaine abuse ([name])   . COPD (chronic obstructive pulmonary disease) ([name])   . Coronary atherosclerosis of native coronary artery    a. 03/09/2013 Cath/PCI: LM nl, LAD: 50p, 23m (2.5x16 promus DES), LCX nl, OM1 min irregs, LPL/LPDA diff dzs, RCA nondom, mod diff dzs, EF 55%.  . Depression   . Essential hypertension   . GERD (gastroesophageal reflux disease)   . Headache   . Hepatitis Late 1970s  . History of nephrolithiasis   . History of pneumonia   . History of stroke    Right MCA distribution, residual left-sided weakness  . Hyperlipidemia    . Noncompliance   . Renal cell carcinoma [facility])    Status post radical right nephrectomy August 2015  . Sleep apnea    On CPAP, 4L O2 no cpap at home yet  . Type 2 diabetes mellitus ([name]) 2011    SOCIAL HX:  Social History   Tobacco Use  . Smoking status: Current Some Day Smoker    Packs/day: 1.00    Years: 41.00    Pack years: 41.00    Types: Cigarettes    Start date: 05/03/1972  . Smokeless tobacco: Never Used  . Tobacco comment: 2-3 per day as of 03/07/2020  Substance Use Topics  . Alcohol use: No    Alcohol/week: 0.0 standard drinks   FAMILY HX:  Family History  Problem Relation Age of Onset  . Cancer Mother        Thyroid - living in her 37's.  [name] Hypertension Other   . Diabetes Other   . Stroke Other   . Lung cancer Father   . CAD Father   . Cancer Maternal Grandmother        Breast  . Cancer Maternal Grandfather        Throat and stomach  . CAD Brother     ALLERGIES:  Allergies  Allergen Reactions  . Lisinopril Other (See Comments)  . Oxycodone Itching and Nausea Only    Pt is able to tolerate if taken with benadryl     PERTINENT MEDICATIONS:  Outpatient Encounter Medications as of 10/02/2020  Medication Sig  . acetaminophen (TYLENOL) 325 MG tablet 2 tablets as needed  . ALPRAZolam (XANAX) 0.5 MG tablet Take 1 tablet (0.5 mg total) by mouth 3 (three) times daily as needed for anxiety.  [name] atorvastatin (LIPITOR)  20 MG tablet Take 20 mg by mouth at bedtime.  . budesonide (PULMICORT) 1 MG/2ML nebulizer solution 4 ml  . budesonide-formoterol (SYMBICORT) 160-4.5 MCG/ACT inhaler Inhale 2 puffs into the lungs 2 (two) times daily.  . clopidogrel (PLAVIX) 75 MG tablet TAKE ONE TABLET BY MOUTH EVERY DAY  . diltiazem (CARDIZEM CD) 180 MG 24 hr capsule Take 1 capsule (180 mg total) by mouth daily.  . diphenhydrAMINE (BENADRYL) 25 mg capsule 1 capsule as needed  . docusate sodium (COLACE) 100 MG capsule Take 200 mg by mouth at bedtime.   [name] esomeprazole (NEXIUM) 40  MG capsule Take 40 mg by mouth daily at 12 noon. Pt only take as needed  . FLUoxetine (PROZAC) 40 MG capsule Take 40 mg by mouth daily. Take with 10 mg capsule = 50 mg total  . formoterol (PERFOROMIST) 20 MCG/2ML nebulizer solution 2 ml  . gabapentin (NEURONTIN) 300 MG capsule Take 900 mg by mouth 2 (two) times daily.   . Glycopyrrolate-Formoterol (BEVESPI AEROSPHERE) 9-4.8 MCG/ACT AERO Inhale 2 puffs into the lungs 2 (two) times daily.  . insulin aspart (NOVOLOG FLEXPEN) 100 UNIT/ML FlexPen 30 units 5-10 min before meals  . insulin lispro (HUMALOG) 100 UNIT/ML cartridge Inject 80 Units into the skin 3 (three) times daily with meals.  . insulin regular human CONCENTRATED (HUMULIN R U-500 KWIKPEN) 500 UNIT/ML kwikpen Inject into the skin.  [name] isosorbide mononitrate (IMDUR) 60 MG 24 hr tablet TAKE ONE TABLET BY MOUTH DAILY  . LANTUS 100 UNIT/ML injection SMARTSIG:115 Unit(s) SUB-Q Twice Daily  . linagliptin (TRADJENTA) 5 MG TABS tablet Take 5 mg by mouth daily.  [name] LORazepam (ATIVAN) 1 MG tablet Take 0.5 mg by mouth at bedtime.   . moxifloxacin (AVELOX) 400 MG tablet 1 tablet  . nitroGLYCERIN (NITROSTAT) 0.4 MG SL tablet Place 1 tablet (0.4 mg total) under the tongue every 5 (five) minutes as needed for chest pain. IF 3 TABLETS ARE NECESSARY CALL 911  . ondansetron (ZOFRAN) 4 MG tablet Take 4 mg by mouth every 6 (six) hours as needed.  . ondansetron (ZOFRAN) 8 MG tablet 1 tablet  . oxybutynin (DITROPAN-XL) 10 MG 24 hr tablet Take 10 mg by mouth daily.  [name] oxyCODONE-acetaminophen (PERCOCET) 10-325 MG tablet Take 1 tablet by mouth every 6 (six) hours as needed for pain.  . potassium chloride SA (K-DUR,KLOR-CON) 20 MEQ tablet Take 20 mEq by mouth 2 (two) times daily.   . pregabalin (LYRICA) 25 MG capsule Take 25 mg by mouth 2 (two) times daily.  . Probiotic Product (PROBIOTIC PO) Take 1 capsule by mouth daily.  . tamsulosin (FLOMAX) 0.4 MG CAPS capsule Take 0.4 mg by mouth daily.   [name] torsemide  (DEMADEX) 20 MG tablet TAKE THREE TABLETS BY MOUTH TWICE DAILY  . zolpidem (AMBIEN) 5 MG tablet Take 5 mg by mouth at bedtime as needed. for insomnia   No facility-administered encounter medications on file as of 10/02/2020.    Thank you for the opportunity to participate in the care of Mr Art Levan. The palliative care team will continue to follow. Please call our office at [phone] if we can be of additional assistance.   Jari Favre, DNP, AGPCNP-BC

## 2020-10-04 ENCOUNTER — Telehealth: Payer: Self-pay | Admitting: Cardiology

## 2020-10-04 ENCOUNTER — Telehealth: Payer: Self-pay | Admitting: Primary Care

## 2020-10-04 DIAGNOSIS — I959 Hypotension, unspecified: Secondary | ICD-10-CM | POA: Diagnosis not present

## 2020-10-04 DIAGNOSIS — I288 Other diseases of pulmonary vessels: Secondary | ICD-10-CM

## 2020-10-04 DIAGNOSIS — R55 Syncope and collapse: Secondary | ICD-10-CM | POA: Diagnosis not present

## 2020-10-04 NOTE — Telephone Encounter (Signed)
Patient stated that Monday 3/14, his bp was 60/40 with a hr of 72. Yesterday patient's bp was 88/70. Patient states that his bp has been staying in the 80/70 range since last office visit with Cardiology. Patient c/o syncope episodes, SOB (COPD), and CP.  Please advise.

## 2020-10-04 NOTE — Telephone Encounter (Signed)
Please let patient know CT chest showed no evidence of fibrotic lung disease. This appears to have cleared on the last CT scan he had. Air trapping indicative of small airways disease related to COPD. He has Emphysema and some scarring to lungs. 45mm lung nodules which is stable. Coronary artery calcification. Enlarged pulmonary artery, we should repeat echocardiogram to evaluate for pulm HTN.   Please order echo and make sure he has follow up with MR first available

## 2020-10-04 NOTE — Telephone Encounter (Signed)
Pt is calling to get the results of the CT that was done on 09/26/2020.  EW please advise. Thanks

## 2020-10-04 NOTE — Telephone Encounter (Signed)
I spoke with patient. He said he has no symptoms today, BP 112/60, HR 72 . He just wants to discuss medication doses.

## 2020-10-04 NOTE — Telephone Encounter (Signed)
Called and spoke with pt and he is aware of results per EW.  He is aware of order placed for the echo and that he will need to follow up with MR after the echo to review these results.

## 2020-10-04 NOTE — Telephone Encounter (Signed)
Pt's BP is running low- his PCP thinks his BP medications need to be adjusted

## 2020-10-07 NOTE — Telephone Encounter (Signed)
Does he have a follow-up visit with [place]?  It appears his blood pressure is stable.  He has had any more episodes?

## 2020-10-07 NOTE — Telephone Encounter (Signed)
Hs 6/1 apt with SM, no further issuses

## 2020-10-07 NOTE — Telephone Encounter (Signed)
Okay thank you

## 2020-10-08 ENCOUNTER — Other Ambulatory Visit: Payer: Self-pay | Admitting: Obstetrics and Gynecology

## 2020-10-08 ENCOUNTER — Other Ambulatory Visit: Payer: Self-pay

## 2020-10-08 NOTE — Patient Instructions (Signed)
Visit Information  Mr. Pond was given information about Medicaid Managed Care team care coordination services as a part of their [place] Medicaid benefit. CHRISTYAN REGER verbally consented to engagement with the [facility] Managed Care team.   For questions related to your [facility], please call: [phone] or visit the homepage here: [email or web address]  If you would like to schedule transportation through your [facility], please call the following number at least 2 days in advance of your appointment: [phone].   Call the [place] at [phone], at any time, 24 hours a day, 7 days a week. If you are in danger or need immediate medical attention call 911.  Mr. Helmers - following are the goals we discussed in your visit today:  Goals Addressed            This Visit's Progress   . [place] My Quality of Life       Timeframe:  Long-Range Goal Priority:  High Start Date:     08/01/20                        Expected End Date:  12/08/20                    Follow Up Date 11/08/20   Anticipate call from [facility] to discuss Palliative Care. [phone]. Update 09/10/20:  Patient had appointment with [facility] scheduled for 2020-08-23.  Patient's brother died 11-Aug-2020 so appointment rescheduled to 10/02/20 at 3pm. Update 10/08/20:  Patient seen by [facility] 10/02/20, has follow up appointment scheduled in April.      Patient verbalizes understanding of instructions provided today.   The Managed Medicaid care management team will reach out to the patient again over the next 30 days.  The patient has been provided with contact information for the Managed Medicaid care management team and has been advised to call with any health related questions or concerns.   Aida Raider RN, BSN [place]  [facility], hospital - Managed Medicaid High Risk [phone].  Following is a copy of your plan of care:  Patient Care Plan: General Plan of Care (Adult)    Problem Identified: Coping Skills (General Plan of Care)     Problem Identified: Quality of Life (General Plan of Care)     Long-Range Goal: Quality of Life Maintained   Start Date: 08/01/2020  Expected End Date: 01/08/2021  Recent Progress: On track  Priority: High  Note:   Current Barriers:  . Care Coordination needs related to palliative care engagement. [name] to independently consult with palliative care team.  Nurse Case Manager Clinical Goal(s):  [name] Over the next 14 days, patient will verbalize understanding of plan for palliative care follow up. [name] Update 10/08/20:  Patient is currently receiving Palliative Care services.  Interventions:  . Inter-disciplinary care team collaboration (see longitudinal plan of care) . Evaluation of current treatment plan related to palliative care  and patient's adherence to plan as established by provider. . Advised patient to anticipate call from [facility] to discuss plan. [name] with [name] regarding palliative care services. [name] Update 10/08/20:  Patient seen by [name] 09/30/20-has follow up appointment scheduled in April.  Patient Goals/Self-Care Activities Over the next 14 days, patient will:  - receive follow up from [facility]. Update 08/06/20:  Patient is scheduled 2020/08/23 at 0900 with [facility]. Update 09/10/20:  Patient  rescheduled appointment to 10/02/20 at 3pm.  Patient's brother died 08/21/2020. Update October 21, 2020:  Patient seen by [name] 10/02/20.  Follow Up Plan: The Managed Medicaid care management team will reach out to the patient again over the next 30 days.  The patient has been provided with contact information for the Managed Medicaid care management team and has been advised to call with any health related questions or concerns.

## 2020-10-08 NOTE — Patient Outreach (Signed)
Medicaid Managed Care   Nurse Care Manager Note  10/08/2020 Name:  [name] MRN:  [id number] DOB:  1956/07/07  [name] is an 65 y.o. year old male who is a primary patient of [name], PA-C (Inactive).  The [facility] Managed Care Coordination team was consulted for assistance with:    chronic healthcare management needs.  [name] was given information about Medicaid Managed Care Coordination team services today. [name] agreed to services and verbal consent obtained.  Engaged with patient by telephone for follow up visit in response to provider referral for case management and/or care coordination services.   Assessments/Interventions:  Review of past medical history, allergies, medications, health status, including review of consultants reports, laboratory and other test data, was performed as part of comprehensive evaluation and provision of chronic care management services.  SDOH (Social Determinants of Health) assessments and interventions performed:   Care Plan  Allergies  Allergen Reactions  . Lisinopril Other (See Comments)  . Oxycodone Itching and Nausea Only    Pt is able to tolerate if taken with benadryl    Medications Reviewed Today    Reviewed by Gayla Medicus, RN (Registered Nurse) on 10/08/20 at 1123  Med List Status: <None>  Medication Order Taking? Sig Documenting Provider Last Dose Status Informant  acetaminophen (TYLENOL) 325 MG tablet [id number]  2 tablets as needed [provider]  Active   ALPRAZolam (XANAX) 0.5 MG tablet [id number] No Take 1 tablet (0.5 mg total) by mouth 3 (three) times daily as needed for anxiety. Dhungel, Nishant, MD Taking Active Self  atorvastatin (LIPITOR) 20 MG tablet [id number] No Take 20 mg by mouth at bedtime. [provider] Taking Active Self  budesonide (PULMICORT) 1 MG/2ML nebulizer solution [id number]  4 ml [provider]  Active   budesonide-formoterol (SYMBICORT) 160-4.5  MCG/ACT inhaler [id number]  Inhale 2 puffs into the lungs 2 (two) times daily. [provider]  Active   clopidogrel (PLAVIX) 75 MG tablet [id number] No TAKE ONE TABLET BY MOUTH EVERY DAY Satira Sark, MD Taking Active   diltiazem (CARDIZEM CD) 180 MG 24 hr capsule [id number] No Take 1 capsule (180 mg total) by mouth daily. Satira Sark, MD Taking Active   diphenhydrAMINE (BENADRYL) 25 mg capsule [id number]  1 capsule as needed [provider]  Active   docusate sodium (COLACE) 100 MG capsule [id number] No Take 200 mg by mouth at bedtime.  [provider] Taking Active Self  esomeprazole (NEXIUM) 40 MG capsule [id number] No Take 40 mg by mouth daily at 12 noon. Pt only take as needed [provider] Taking Active   FLUoxetine (PROZAC) 40 MG capsule [id number] No Take 40 mg by mouth daily. Take with 10 mg capsule = 50 mg total [provider] Taking Active Self  formoterol (PERFOROMIST) 20 MCG/2ML nebulizer solution [id number]  2 ml [provider]  Active   gabapentin (NEURONTIN) 300 MG capsule [id number] No Take 900 mg by mouth 2 (two) times daily.  [provider] Taking Active Self  Glycopyrrolate-Formoterol (BEVESPI AEROSPHERE) 9-4.8 MCG/ACT AERO [id number] No Inhale 2 puffs into the lungs 2 (two) times daily. Brand Males, MD Taking Active Self  insulin aspart (NOVOLOG FLEXPEN) 100 UNIT/ML FlexPen [id number]  30 units 5-10 min before meals [provider]  Active   insulin lispro (HUMALOG) 100 UNIT/ML cartridge [id number] No Inject 80 Units into the skin 3 (three) times daily with meals. [provider] Taking Active  insulin regular human CONCENTRATED (HUMULIN R U-500 KWIKPEN) 500 UNIT/ML Claiborne Rigg [id number] No Inject into the skin. [provider] Taking Active   isosorbide mononitrate (IMDUR) 60 MG 24 hr tablet [id number]  TAKE ONE TABLET BY MOUTH DAILY Satira Sark, MD  Active   LANTUS 100  UNIT/ML injection [id number]  SMARTSIG:115 Unit(s) SUB-Q Twice Daily [provider]  Active   linagliptin (TRADJENTA) 5 MG TABS tablet [id number] No Take 5 mg by mouth daily. [provider] Taking Active Self  LORazepam (ATIVAN) 1 MG tablet [id number] No Take 0.5 mg by mouth at bedtime.  [provider] Taking Active Self  moxifloxacin (AVELOX) 400 MG tablet [id number]  1 tablet [provider]  Active   nitroGLYCERIN (NITROSTAT) 0.4 MG SL tablet [id number]  Place 1 tablet (0.4 mg total) under the tongue every 5 (five) minutes as needed for chest pain. IF 3 TABLETS ARE NECESSARY CALL 911 Satira Sark, MD  Active   ondansetron [facility]) 4 MG tablet [id number]  Take 4 mg by mouth every 6 (six) hours as needed. [provider]  Active   ondansetron (ZOFRAN) 8 MG tablet [id number]  1 tablet [provider]  Active   oxybutynin (DITROPAN-XL) 10 MG 24 hr tablet [id number] No Take 10 mg by mouth daily. [provider] Taking Active Self  oxyCODONE-acetaminophen (PERCOCET) 10-325 MG tablet [id number] No Take 1 tablet by mouth every 6 (six) hours as needed for pain. Dhungel, Nishant, MD Taking Active Self  potassium chloride SA (K-DUR,KLOR-CON) 20 MEQ tablet [id number] No Take 20 mEq by mouth 2 (two) times daily.  [provider] Taking Active Self  pregabalin (LYRICA) 25 MG capsule [id number] No Take 25 mg by mouth 2 (two) times daily. [provider] Taking Active   Probiotic Product (PROBIOTIC PO) [id number] No Take 1 capsule by mouth daily. [provider] Taking Active Self  tamsulosin (FLOMAX) 0.4 MG CAPS capsule [id number] No Take 0.4 mg by mouth daily.  [provider] Taking Active Self  torsemide (DEMADEX) 20 MG tablet [id number] No TAKE THREE TABLETS BY MOUTH TWICE DAILY Satira Sark, MD Taking Active   zolpidem (AMBIEN) 5 MG tablet [id number] No Take 5 mg by mouth at bedtime as needed. for insomnia  [provider] Taking Active           Patient Active Problem List   Diagnosis Date Noted  . Sepsis ([place]) 05/19/2019  . Sepsis with hypotension ([name]) 12/23/2018  . Chronic respiratory failure with hypoxia ([place]) 12/23/2018  . UTI symptoms 11/08/2018  . Abdominal distention 11/08/2018  . Chest pain 11/08/2018  . Hyperglycemia 11/08/2018  . AKI (acute kidney injury) ([place]) 11/08/2018  . Acute metabolic encephalopathy 24/26/8341  . Polypharmacy 06/03/2018  . Diabetes mellitus due to underlying condition, uncontrolled, with hyperglycemia, with long-term current use of insulin ([place]) 06/03/2018  . Community acquired pneumonia 05/31/2018  . Acute on chronic respiratory failure with hypoxia ([place]) 05/31/2018  . Acute respiratory failure ([place])   . Decreased mobility and endurance 07/19/2015  . CKD (chronic kidney disease) stage 3, GFR 30-59 ml/min ([name]) 07/06/2015  . Acute on chronic respiratory failure ([place]) 07/05/2015  . COPD with acute exacerbation ([place]) 07/05/2015  . Interstitial lung disease ([place]) 04/10/2015  . Cough syncope 04/10/2015  . Dyspnea 04/10/2015  . Atypical chest pain 02/28/2015  . Syncope 02/28/2015  . Orthostatic dizziness 02/25/2015  . Right leg pain 02/25/2015  . Right calf pain 02/14/2015  . Malaise and fatigue 02/14/2015  .  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[place]) 11/12/2014  . Smoking 11/12/2014  . History of stroke 10/24/2014  . Carotid disease, bilateral ([place]) 10/24/2014  . Orthostatic hypotension 10/24/2014  . Obstructive sleep apnea   . HLD (hyperlipidemia)   . Acute on chronic renal failure ([place])   . Renal cell carcinoma ([place])   . Metabolic encephalopathy   . Depression   . Anxiety state   . Noncompliance with medication regimen   . Carotid artery stenosis 10/08/2014  . Left sided numbness 09/26/2014  . Hyperreflexia 09/26/2014  . COLD (chronic obstructive lung disease) ([place]) 08/16/2014  . ILD (interstitial lung disease) ([place])  08/16/2014  . Dizziness and giddiness 08/16/2014  . Other specified hypotension 08/16/2014  . Pulmonary edema   . Other emphysema ([place])   . Snoring 04/30/2014  . Fluid overload 02/22/2014  . Renal neoplasm 02/21/2014  . Preoperative cardiovascular examination 02/07/2014  . Renal mass, right 12/21/2013  . Coronary atherosclerosis of native coronary artery   . Substance abuse ([place])   . Hyperlipidemia   . Diabetes mellitus type 2, uncontrolled, with cardiovascular complications 16/04/9603  . Tobacco abuse 06/01/2012  . H/O medication noncompliance 06/01/2012  . Cocaine abuse ([place]) 06/01/2012  . Essential hypertension, benign     Conditions to be addressed/monitored per PCP order:  chronic healthcare management needs, COPD, obstructive sleep apnea, interstitial lung disease with emphysema, pulmonary edema, chronic respiratory failure on high flow oxygen, past substance abuse, chronic pain on narcotics, polypharmacy, type 2 diabetes, essential hypertension, orthostatic hypotension, coronary arthrosclerosis, metabolic encephalopathy, chronic kidney disease stage III, renal cell carcinoma  Care Plan : General Plan of Care (Adult)  Updates made by Gayla Medicus, RN since 2020/11/03 12:00 AM    Problem: Quality of Life (General Plan of Care)     Long-Range Goal: Quality of Life Maintained   Start Date: 08/01/2020  Expected End Date: 01/08/2021  Recent Progress: On track  Priority: High  Note:   Current Barriers:  . Care Coordination needs related to palliative care engagement. [name] to independently consult with palliative care team.  Nurse Case Manager Clinical Goal(s):  [name] Over the next 14 days, patient will verbalize understanding of plan for palliative care follow up. [name] Update 03-Nov-2020:  Patient is currently receiving Palliative Care services.  Interventions:  . Inter-disciplinary care team collaboration (see longitudinal plan of care) . Evaluation of current treatment plan  related to palliative care  and patient's adherence to plan as established by provider. . Advised patient to anticipate call from [facility] to discuss plan. [name] with [name] regarding palliative care services. [name] Update 2020/11/03:  Patient seen by [name] 09/30/20-has follow up appointment scheduled in April.  Patient Goals/Self-Care Activities Over the next 14 days, patient will:  - receive follow up from [facility]. Update 08/06/20:  Patient is scheduled 08/20/20 at 0900 with [facility]. Update 09/10/20:  Patient rescheduled appointment to 10/02/20 at 3pm.  Patient's brother died Sep 03, 2020. Update 03-Nov-2020:  Patient seen by [name] 10/02/20.  Follow Up Plan: The Managed Medicaid care management team will reach out to the patient again over the next 30 days.  The patient has been provided with contact information for the Managed Medicaid care management team and has been advised to call with any health related questions or concerns.      Follow Up:  Patient agrees to Care Plan and Follow-up.  Plan: The Managed Medicaid care management team will reach out to the patient again over the next 30 days. and  The patient has been provided with contact information for the Managed Medicaid care management team and has been advised to call with any health related questions or concerns.  Date/time of next scheduled RN care management/care coordination outreach:  11/08/20 at 1030.

## 2020-10-18 DIAGNOSIS — J449 Chronic obstructive pulmonary disease, unspecified: Secondary | ICD-10-CM | POA: Diagnosis not present

## 2020-11-08 ENCOUNTER — Ambulatory Visit (INDEPENDENT_AMBULATORY_CARE_PROVIDER_SITE_OTHER): Payer: Medicare Other | Admitting: Podiatry

## 2020-11-08 ENCOUNTER — Other Ambulatory Visit: Payer: Self-pay

## 2020-11-08 ENCOUNTER — Encounter: Payer: Self-pay | Admitting: Podiatry

## 2020-11-08 ENCOUNTER — Other Ambulatory Visit: Payer: Self-pay | Admitting: Obstetrics and Gynecology

## 2020-11-08 DIAGNOSIS — I5189 Other ill-defined heart diseases: Secondary | ICD-10-CM | POA: Insufficient documentation

## 2020-11-08 DIAGNOSIS — E1165 Type 2 diabetes mellitus with hyperglycemia: Secondary | ICD-10-CM | POA: Insufficient documentation

## 2020-11-08 DIAGNOSIS — Z85528 Personal history of other malignant neoplasm of kidney: Secondary | ICD-10-CM | POA: Insufficient documentation

## 2020-11-08 DIAGNOSIS — H9190 Unspecified hearing loss, unspecified ear: Secondary | ICD-10-CM | POA: Insufficient documentation

## 2020-11-08 DIAGNOSIS — L84 Corns and callosities: Secondary | ICD-10-CM | POA: Diagnosis not present

## 2020-11-08 DIAGNOSIS — IMO0002 Reserved for concepts with insufficient information to code with codable children: Secondary | ICD-10-CM

## 2020-11-08 DIAGNOSIS — F17200 Nicotine dependence, unspecified, uncomplicated: Secondary | ICD-10-CM | POA: Insufficient documentation

## 2020-11-08 DIAGNOSIS — N400 Enlarged prostate without lower urinary tract symptoms: Secondary | ICD-10-CM | POA: Insufficient documentation

## 2020-11-08 DIAGNOSIS — M79674 Pain in right toe(s): Secondary | ICD-10-CM

## 2020-11-08 DIAGNOSIS — G8929 Other chronic pain: Secondary | ICD-10-CM | POA: Insufficient documentation

## 2020-11-08 DIAGNOSIS — N139 Obstructive and reflux uropathy, unspecified: Secondary | ICD-10-CM | POA: Insufficient documentation

## 2020-11-08 DIAGNOSIS — B351 Tinea unguium: Secondary | ICD-10-CM | POA: Diagnosis not present

## 2020-11-08 DIAGNOSIS — R630 Anorexia: Secondary | ICD-10-CM | POA: Insufficient documentation

## 2020-11-08 DIAGNOSIS — I8393 Asymptomatic varicose veins of bilateral lower extremities: Secondary | ICD-10-CM | POA: Insufficient documentation

## 2020-11-08 DIAGNOSIS — Z794 Long term (current) use of insulin: Secondary | ICD-10-CM | POA: Insufficient documentation

## 2020-11-08 DIAGNOSIS — G629 Polyneuropathy, unspecified: Secondary | ICD-10-CM | POA: Insufficient documentation

## 2020-11-08 DIAGNOSIS — M79675 Pain in left toe(s): Secondary | ICD-10-CM

## 2020-11-08 DIAGNOSIS — R159 Full incontinence of feces: Secondary | ICD-10-CM | POA: Insufficient documentation

## 2020-11-08 DIAGNOSIS — I5032 Chronic diastolic (congestive) heart failure: Secondary | ICD-10-CM | POA: Insufficient documentation

## 2020-11-08 DIAGNOSIS — E1142 Type 2 diabetes mellitus with diabetic polyneuropathy: Secondary | ICD-10-CM | POA: Insufficient documentation

## 2020-11-08 DIAGNOSIS — E1149 Type 2 diabetes mellitus with other diabetic neurological complication: Secondary | ICD-10-CM

## 2020-11-08 DIAGNOSIS — K219 Gastro-esophageal reflux disease without esophagitis: Secondary | ICD-10-CM | POA: Insufficient documentation

## 2020-11-08 DIAGNOSIS — Z6838 Body mass index (BMI) 38.0-38.9, adult: Secondary | ICD-10-CM | POA: Insufficient documentation

## 2020-11-08 NOTE — Patient Instructions (Signed)
Visit Information  [name]  - as a part of your Medicaid benefit, you are eligible for care management and care coordination services at no cost or copay. I was unable to reach you by phone today but would be happy to help you with your health related needs. Please feel free to call me at [phone].  A member of the Managed Medicaid care management team will reach out to you again over the next 7 days.   Ronniesha Seibold RN, BSN [place]  [facility] Care Management Coordinator - Managed Medicaid High Risk [phone].    

## 2020-11-08 NOTE — Patient Outreach (Signed)
Care Coordination  11/08/2020  ELDRIDGE MARCOTT 1956-02-28 [id number]   Medicaid Managed Care   Unsuccessful Outreach Note  11/08/2020 Name: MISHAEL HARAN MRN: [id number] DOB: 09-Mar-1956  Referred by: [name], PA-C (Inactive) Reason for referral : High Risk Managed Medicaid (Unsuccessful telephone outreach)   An unsuccessful telephone outreach was attempted today. The patient was referred to the case management team for assistance with care management and care coordination.   Follow Up Plan: A member of the Managed Medicaid  care management team will reach out to the patient again over the next 7 days.   Aida Raider RN, BSN [place]  [facility] - Managed Medicaid High Risk [phone].

## 2020-11-13 ENCOUNTER — Telehealth: Payer: Self-pay | Admitting: Internal Medicine

## 2020-11-13 ENCOUNTER — Telehealth: Payer: Self-pay | Admitting: Nurse Practitioner

## 2020-11-13 NOTE — Progress Notes (Signed)
Subjective: 65 y.o. returns the office today for painful, elongated, thickened toenails which he cannot trim himself. Denies any redness or drainage around the nails. Denies any acute changes since last appointment and no new complaints today. Denies any systemic complaints such as fevers, chills, nausea, vomiting.   PCP: [name], PA-C (Inactive)   Objective: AAO 3, NAD DP/PT pulses palpable, CRT less than 3 seconds Protective sensation decreased with Simms Weinstein monofilament Nails hypertrophic, dystrophic, elongated, brittle, discolored 10. There is tenderness overlying the nails 1-5 bilaterally. There is no surrounding erythema or drainage along the nail sites. Preulcerative callus on his left foot submetatarsal 1.  Upon debridement no underlying ulceration drainage or signs of infection. No pain with calf compression, swelling, warmth, erythema.  Assessment: Patient presents with symptomatic onychomycosis  Plan: -Treatment options including alternatives, risks, complications were discussed -Nails sharply debrided 10 without complication/bleeding. -Sharply debrided the hyperkeratotic lesion x1 without any complications or bleeding.  Recommend moisturizer and offloading daily.  Monitor for any signs or symptoms of infection. -Discussed daily foot inspection. If there are any changes, to call the office immediately.  -Follow-up in 9 weeks or sooner if any problems are to arise. In the meantime, encouraged to call the office with any questions, concerns, changes symptoms.  Celesta Gentile, DPM

## 2020-11-13 NOTE — Telephone Encounter (Signed)
I have called and spoke with the pt and he stated that the nurse that was to come out had an emergency and she did finally call the pt and rescheduled with him.  Nothing further is needed.

## 2020-11-14 DIAGNOSIS — R197 Diarrhea, unspecified: Secondary | ICD-10-CM | POA: Diagnosis not present

## 2020-11-14 DIAGNOSIS — R1319 Other dysphagia: Secondary | ICD-10-CM | POA: Diagnosis not present

## 2020-11-14 DIAGNOSIS — K921 Melena: Secondary | ICD-10-CM | POA: Diagnosis not present

## 2020-11-15 ENCOUNTER — Other Ambulatory Visit: Payer: Self-pay | Admitting: Cardiology

## 2020-11-18 ENCOUNTER — Other Ambulatory Visit: Payer: Medicare Other | Admitting: Nurse Practitioner

## 2020-11-18 ENCOUNTER — Telehealth: Payer: Self-pay | Admitting: Internal Medicine

## 2020-11-18 ENCOUNTER — Other Ambulatory Visit: Payer: Self-pay

## 2020-11-18 VITALS — BP 122/68 | HR 95 | Resp 18

## 2020-11-18 DIAGNOSIS — R0601 Orthopnea: Secondary | ICD-10-CM | POA: Diagnosis not present

## 2020-11-18 DIAGNOSIS — Z515 Encounter for palliative care: Secondary | ICD-10-CM | POA: Diagnosis not present

## 2020-11-18 DIAGNOSIS — R197 Diarrhea, unspecified: Secondary | ICD-10-CM

## 2020-11-18 DIAGNOSIS — G894 Chronic pain syndrome: Secondary | ICD-10-CM | POA: Diagnosis not present

## 2020-11-18 NOTE — Progress Notes (Signed)
[facility] Consult Note Telephone: [phone]  Fax: [phone]    Date of encounter: 11/18/20 PATIENT NAME: [name] [address] [place] [address]   [id number] (home)  DOB: 07-15-1956 MRN: [id number]   PRIMARY CARE PROVIDER:    Garth [name] (Inactive),  [address] [place] [address] [phone]  REFERRING PROVIDER:   Carolee Rota, NP  RESPONSIBLE PARTY:    Contact Information    Name Relation Home Work Mobile   [name],Kevin Mother [phone]  [phone]   Dona, Klemann [phone]  [phone]     I met face to face with patient in home.His mother present in home at visit. Palliative Care was asked to follow this patient by consultation request of  to address advance care planning and complex medical decision making. This is a follow up visit.                                  ASSESSMENT AND PLAN / RECOMMENDATIONS:   Advance Care Planning/Goals of Care: Goals include to maximize quality of life and symptom management. Our advance care planning conversation included a discussion about:     Experiences with loved ones who have been seriously ill or have died   Exploration of personal, cultural or spiritual beliefs that might influence medical decisions   CODE STATUS: DNR Goal of care: Patient's goal of care is comfort while preserving function. Directives: signed DNR and MOST form present in home, copy on [facility] EMR. Patient reiterated desire to not be resuscitated in the event of cardiac or respiratory arrest. Patient verbalized desire for cremation after his death, saying his family is aware of his decision. Validation provided. Provided general support and encouragement. Palliative care will continue to provide support to patient, family and medical team.  I spent 25 minutes providing this consultation. More than 50% of the time in this consultation  was spent in counseling and care coordination. -----------------------------------------------------------------------------  Symptom Management/Plan: Orthopnea: Phone call made to Dr. Chase Caller to discuss possibility of restarting patient's CPAP use at bedtime.  Melena/diarhea: Condition ongoing in the last 2 months, associated with watery stools/diarhea. Described stool as chocolate color, denied bright red blood. No report of nausea, vomiting or abdominal pain or bloating, report occasional lightheaded with sudden position change. Patient was seen by GI four days ago. Pending results for stool culture and rule out C-diff, report been scheduled for upper GI endoscopy. Patient report having up to 5  bowel movements in a day. Patient report he was advised against taking anti-diarrhea by GI until after completion of his diagnostics. Plan: continue current plan of care. Follow up with GI as scheduled. Encouraged adequate fluid intake to maintain hydration. Discussed fall prevention strategies,encourage use of his walker during ambulation. Chronic pain: controlled on current regimen. Continue Oxycodone -acetaminophen 10-325mg  by mouth every 6hrs as needed and Lyrica 25mg  twice a day.  Follow up Palliative Care Visit: Palliative care will continue to follow for complex medical decision making, advance care planning, and clarification of goals. Return in about 4 weeks or prn.  PPS: 50%  HOSPICE ELIGIBILITY/DIAGNOSIS: TBD  CHIEF COMPLAIN: Difficulty breathing while lying down  History obtained from review of EMR, and discussion with patient.  HISTORY OF PRESENT ILLNESS:Kevin W Knightis a 65 y.o.year old malewith complaint of difficulty breathing while lying down in the context of COPD and  history of obstructive sleep apnea. Report condition associated with feeling of smothering during sleep. Report condition is intermittent and has worsened in the last month, requiring him to sleep on 4 pillows.  He said sleeping on the 4 pillows puts a strain on his neck, giving him neck pain in the mornings. He denied new cough, denied fever, denied chills, denied increased lower extremity edema, unsure if he snores when sleeping. No signs of acute distress noted during visit. Ten systems reviewed and are negative for acute change, except as noted in the HPI. Review of last CT scan completed on 09/24/2020 showed stable findings compared with previous imaging. Patient requested to be restarted on CPAP which was discontinued when he was on Hospice care. He was last seen by his pulmonologist 8 months ago. His other medical problems include interstitial lung disease with emphysema,pulmonary edema, chronic respiratory failure on high flow oxygen at 6L,past substance abuse,chronic pain on narcotics,type 2 diabetes, essential hypertension, orthostatic hypotension, coronary arthrosclerosis, metabolic encephalopathy, chronic kidney disease stage III, hx renal cell carcinoma.  I reviewed available labs, medications, imaging, studies and related documents from the EMR.  Records reviewed and summarized above.   Physical Exam: General: frail appearing, cooperative, NAD EYES: anicteric sclera, no discharge  ENMT: intact hearing, oral mucous membranes moist CV: no LE edema Pulmonary: no increased work of breathing, no cough, no wheezing Abdomen:no ascites GU: deferred MSK: moves all extremities, ambulatory Skin: warm and dry, no rashes or wounds on visible skin Neuro: Generalized weakness,A and O x 4 Psych: non-anxious affecttoday Hem/lymph/immuno: no widespread bruising  Past Medical History:  Diagnosis Date  . Anxiety   . Arthritis   . Cholecystitis, acute 12/20/2013   Lap chole 6/5  . Cocaine abuse ([place])   . COPD (chronic obstructive pulmonary disease) ([place])   . Coronary atherosclerosis of native coronary artery    a. 03/09/2013 Cath/PCI: LM nl, LAD: 50p, 34m (2.5x16 promus DES), LCX nl, OM1 min  irregs, LPL/LPDA diff dzs, RCA nondom, mod diff dzs, EF 55%.  . Depression   . Essential hypertension   . GERD (gastroesophageal reflux disease)   . Headache   . Hepatitis Late 1970s  . History of nephrolithiasis   . History of pneumonia   . History of stroke    Right MCA distribution, residual left-sided weakness  . Hyperlipidemia   . Noncompliance   . Renal cell carcinoma [facility])    Status post radical right nephrectomy August 2015  . Sleep apnea    On CPAP, 4L O2 no cpap at home yet  . Type 2 diabetes mellitus ([place]) 2011   Current Outpatient Medications on File Prior to Visit  Medication Sig Dispense Refill  . acetaminophen (TYLENOL) 325 MG tablet 2 tablets as needed    . ALPRAZolam (XANAX) 0.25 MG tablet Take 0.25 mg by mouth 3 (three) times daily as needed.    . ALPRAZolam (XANAX) 0.5 MG tablet Take 1 tablet (0.5 mg total) by mouth 3 (three) times daily as needed for anxiety.  0  . atorvastatin (LIPITOR) 20 MG tablet Take 20 mg by mouth at bedtime.  1  . budesonide (PULMICORT) 1 MG/2ML nebulizer solution 4 ml    . budesonide-formoterol (SYMBICORT) 160-4.5 MCG/ACT inhaler Inhale 2 puffs into the lungs 2 (two) times daily.    . clopidogrel (PLAVIX) 75 MG tablet TAKE ONE TABLET BY MOUTH EVERY DAY 90 tablet 1  . diltiazem (CARDIZEM CD) 180 MG 24 hr capsule Take 1 capsule (180 mg total)  by mouth daily. 90 capsule 2  . diphenhydrAMINE (BENADRYL) 25 mg capsule 1 capsule as needed    . docusate sodium (COLACE) 100 MG capsule Take 200 mg by mouth at bedtime.     [name] esomeprazole (NEXIUM) 40 MG capsule Take 40 mg by mouth daily at 12 noon. Pt only take as needed    . FLUoxetine (PROZAC) 40 MG capsule Take 40 mg by mouth daily. Take with 10 mg capsule = 50 mg total    . formoterol (PERFOROMIST) 20 MCG/2ML nebulizer solution 2 ml    . gabapentin (NEURONTIN) 300 MG capsule Take 900 mg by mouth 2 (two) times daily.     [name] glucose blood (TRUE METRIX BLOOD GLUCOSE TEST) test strip     .  Glycopyrrolate-Formoterol (BEVESPI AEROSPHERE) 9-4.8 MCG/ACT AERO Inhale 2 puffs into the lungs 2 (two) times daily. 1 Inhaler 11  . insulin aspart (NOVOLOG FLEXPEN) 100 UNIT/ML FlexPen 30 units 5-10 min before meals    . insulin lispro (HUMALOG) 100 UNIT/ML cartridge Inject 80 Units into the skin 3 (three) times daily with meals.    . insulin regular human CONCENTRATED (HUMULIN R U-500 KWIKPEN) 500 UNIT/ML kwikpen Inject into the skin.    [name] isosorbide mononitrate (IMDUR) 60 MG 24 hr tablet TAKE ONE TABLET BY MOUTH DAILY 90 tablet 1  . LANTUS 100 UNIT/ML injection SMARTSIG:115 Unit(s) SUB-Q Twice Daily    . linagliptin (TRADJENTA) 5 MG TABS tablet Take 5 mg by mouth daily.    [name] LORazepam (ATIVAN) 1 MG tablet Take 0.5 mg by mouth at bedtime.     . moxifloxacin (AVELOX) 400 MG tablet 1 tablet    . nitroGLYCERIN (NITROSTAT) 0.4 MG SL tablet PLACE ONE tablet (0.4 MG total) UNDER THE TONGUE every FIVE (five) minutes as needed FOR CHEST PAIN. IF 3 TABLETS ARE NECESSARY CALL 911] 25 tablet 3  . ondansetron (ZOFRAN) 4 MG tablet Take 4 mg by mouth every 6 (six) hours as needed.    . ondansetron (ZOFRAN) 8 MG tablet 1 tablet    . oxybutynin (DITROPAN-XL) 10 MG 24 hr tablet Take 10 mg by mouth daily.  11  . oxyCODONE-acetaminophen (PERCOCET) 10-325 MG tablet Take 1 tablet by mouth every 6 (six) hours as needed for pain. 30 tablet 0  . potassium chloride SA (K-DUR,KLOR-CON) 20 MEQ tablet Take 20 mEq by mouth 2 (two) times daily.     . pregabalin (LYRICA) 25 MG capsule Take 25 mg by mouth 2 (two) times daily.    . Probiotic Product (PROBIOTIC PO) Take 1 capsule by mouth daily.    . tamsulosin (FLOMAX) 0.4 MG CAPS capsule Take 0.4 mg by mouth daily.     [name] torsemide (DEMADEX) 20 MG tablet TAKE THREE TABLETS BY MOUTH TWICE DAILY 540 tablet 1  . zolpidem (AMBIEN) 5 MG tablet Take 5 mg by mouth at bedtime as needed. for insomnia     No current facility-administered medications on file prior to visit.    Thank  you for the opportunity to participate in the care of Mr. Bynum.  The palliative care team will continue to follow. Please call our office at [phone] if we can be of additional assistance.   Alba Destine, NP DNP,AGPCNP-BC  COVID-19 PATIENT SCREENING TOOL Asked and negative response unless otherwise noted:   Have you had symptoms of covid, tested positive or been in contact with someone with symptoms/positive test in the past 5-10 days?

## 2020-11-18 NOTE — Telephone Encounter (Signed)
Called and left message on voicemail to please return phone call. Contact number provided. 

## 2020-11-19 ENCOUNTER — Telehealth: Payer: Self-pay | Admitting: Internal Medicine

## 2020-11-19 NOTE — Telephone Encounter (Signed)
Called and spoke to [place], NP, with palliative care. She states the pt was on a CPAP while under hospice care but once pt transitioned from hospice to palliative care they d/c the CPAP. Pt is now questioning if he can get back on the CPAP. Gwenith states the pt was on CPAP prior to entering Hospice. However, I do not see in his recent chart where pt was on CPAP.  Gwenith couldn't give me much information as she thought we had the pt's CPAP start information.    Dr. Chase Caller, please advise on pt's CPAP.

## 2020-11-19 NOTE — Telephone Encounter (Signed)
MR please advise on placing an order for a hospital bed for the pt.

## 2020-11-21 NOTE — Telephone Encounter (Signed)
Spoke with patient to let him know that Dr. Chase Caller would like for him to reach out to his PCP to see if they would do order for hospital bed. Patient expressed understanding. Advised him to let us know if he needs anything else or if they wont do it. Nothing further needed at this time.

## 2020-11-21 NOTE — Telephone Encounter (Signed)
Called and spoke with patient to let him know that we needed to get him scheduled with one of our sleep doctors to see about a cpap machine because Dr. Chase Caller does not do sleep. Advised I would like to get his scheduled with Dr. Halford Chessman since he read his test back in 2016. Patient agreed and is now scheduled for 12/20/20 with Dr. Halford Chessman. Nothing further needed at this time.

## 2020-11-21 NOTE — Telephone Encounter (Signed)
Can the PCP [name], PA-C (Inactive) not handle it? If no PCP , then we can place it

## 2020-11-21 NOTE — Telephone Encounter (Signed)
Patient needs a sleep doc eval for cPAP. I do not recollect cPAP.

## 2020-11-21 NOTE — Telephone Encounter (Signed)
Called and spoke with [facility]. I advised her that I will reach out to the patient to see if I can get him scheduled with one of our sleep doctors for a sleep consult since MR does not deal with sleep. She verbalized understanding.   Called patient but was told that he was not available and to call back in about 30 minutes.

## 2020-11-21 NOTE — Telephone Encounter (Signed)
Called patient but was told he was not available and to call back in 30 minutes.

## 2020-11-22 ENCOUNTER — Ambulatory Visit: Payer: Medicaid Other | Admitting: Podiatry

## 2020-11-22 DIAGNOSIS — N183 Chronic kidney disease, stage 3 unspecified: Secondary | ICD-10-CM | POA: Diagnosis not present

## 2020-11-22 DIAGNOSIS — E1140 Type 2 diabetes mellitus with diabetic neuropathy, unspecified: Secondary | ICD-10-CM | POA: Diagnosis not present

## 2020-11-22 DIAGNOSIS — G629 Polyneuropathy, unspecified: Secondary | ICD-10-CM | POA: Diagnosis not present

## 2020-11-22 DIAGNOSIS — G8929 Other chronic pain: Secondary | ICD-10-CM | POA: Diagnosis not present

## 2020-11-22 DIAGNOSIS — Z794 Long term (current) use of insulin: Secondary | ICD-10-CM | POA: Diagnosis not present

## 2020-11-22 DIAGNOSIS — I25110 Atherosclerotic heart disease of native coronary artery with unstable angina pectoris: Secondary | ICD-10-CM | POA: Diagnosis not present

## 2020-11-22 DIAGNOSIS — J449 Chronic obstructive pulmonary disease, unspecified: Secondary | ICD-10-CM | POA: Diagnosis not present

## 2020-11-22 DIAGNOSIS — I5032 Chronic diastolic (congestive) heart failure: Secondary | ICD-10-CM | POA: Diagnosis not present

## 2020-11-22 DIAGNOSIS — R600 Localized edema: Secondary | ICD-10-CM | POA: Diagnosis not present

## 2020-11-25 ENCOUNTER — Other Ambulatory Visit: Payer: Self-pay | Admitting: Obstetrics and Gynecology

## 2020-11-25 ENCOUNTER — Other Ambulatory Visit: Payer: Self-pay

## 2020-11-25 NOTE — Patient Outreach (Cosign Needed)
Medicaid Managed Care   Nurse Care Manager Note  11/25/2020 Name:  [name] MRN:  [id number] DOB:  [birth year]  [name] is an 65 y.o. year old male who is a primary patient of Kevin Graff, PA-C (Inactive).  The [facility] Managed Care Coordination team was consulted for assistance with:    chronic healthcare management needs-patient is being followed by [facility].  [name] was given information about Medicaid Managed Care Coordination team services today. [name] agreed to services and verbal consent obtained.  Engaged with patient by telephone for follow up visit in response to provider referral for case management and/or care coordination services.   Assessments/Interventions:  Review of past medical history, allergies, medications, health status, including review of consultants reports, laboratory and other test data, was performed as part of comprehensive evaluation and provision of chronic care management services.  SDOH (Social Determinants of Health) assessments and interventions performed:   Care Plan  Allergies  Allergen Reactions  . Lisinopril Other (See Comments)  . Oxycodone Itching and Nausea Only    Pt is able to tolerate if taken with benadryl    Medications Reviewed Today    Reviewed by Kevin Medicus, RN (Registered Nurse) on 11/25/20 at [facility] List Status: <None>  Medication Order Taking? Sig Documenting Provider Last Dose Status Informant  acetaminophen (TYLENOL) 325 MG tablet [id number]  2 tablets as needed [provider]  Active   ALPRAZolam (XANAX) 0.25 MG tablet [id number]  Take 0.25 mg by mouth 3 (three) times daily as needed. [provider]  Active   ALPRAZolam Duanne Moron) 0.5 MG tablet [id number] No Take 1 tablet (0.5 mg total) by mouth 3 (three) times daily as needed for anxiety. [name], Nishant, MD Taking Active Self  atorvastatin (LIPITOR) 20 MG tablet [id number] No Take 20 mg by mouth at bedtime. [provider] Taking Active Self  budesonide (PULMICORT) 1 MG/2ML nebulizer solution [id number]  4 ml [provider]  Active   budesonide-formoterol (SYMBICORT) 160-4.5 MCG/ACT inhaler [id number]  Inhale 2 puffs into the lungs 2 (two) times daily. [provider]  Active   clopidogrel (PLAVIX) 75 MG tablet [id number] No TAKE ONE TABLET BY MOUTH EVERY DAY Kevin Sark, MD Taking Active   diltiazem (CARDIZEM CD) 180 MG 24 hr capsule [id number] No Take 1 capsule (180 mg total) by mouth daily. Kevin Sark, MD Taking Active   diphenhydrAMINE (BENADRYL) 25 mg capsule [id number]  1 capsule as needed [provider]  Active   docusate sodium (COLACE) 100 MG capsule [id number] No Take 200 mg by mouth at bedtime.  [provider] Taking Active Self  esomeprazole (NEXIUM) 40 MG capsule [id number] No Take 40 mg by mouth daily at 12 noon. Pt only take as needed [provider] Taking Active   FLUoxetine (PROZAC) 40 MG capsule [id number] No Take 40 mg by mouth daily. Take with 10 mg capsule = 50 mg total [provider] Taking Active Self  formoterol (PERFOROMIST) 20 MCG/2ML nebulizer solution [id number]  2 ml [provider]  Active   gabapentin (NEURONTIN) 300 MG capsule [id number] No Take 900 mg by mouth 2 (two) times daily.  [provider] Taking Active Self  glucose blood (TRUE METRIX BLOOD GLUCOSE TEST) test strip [id number]   [provider]  Active   Glycopyrrolate-Formoterol (BEVESPI AEROSPHERE) 9-4.8 MCG/ACT AERO [id number] No Inhale 2 puffs into the lungs 2 (two) times daily. Kevin Males, MD Taking Active Self  insulin aspart (NOVOLOG FLEXPEN) 100 UNIT/ML FlexPen [id number]  30 units 5-10 min before meals [provider]  Active   insulin lispro (HUMALOG) 100 UNIT/ML cartridge [id number] No Inject 80 Units into the skin 3 (three) times daily with meals. [provider] Taking Active   insulin  regular human CONCENTRATED (HUMULIN R U-500 KWIKPEN) 500 UNIT/ML Claiborne [name] [id number] No Inject into the skin. [provider] Taking Active   isosorbide mononitrate (IMDUR) 60 MG 24 hr tablet [id number]  TAKE ONE TABLET BY MOUTH DAILY Kevin Sark, MD  Active   LANTUS 100 UNIT/ML injection [id number]  SMARTSIG:115 Unit(s) SUB-Q Twice Daily [provider]  Active   linagliptin (TRADJENTA) 5 MG TABS tablet [id number] No Take 5 mg by mouth daily. [provider] Taking Active Self  LORazepam (ATIVAN) 1 MG tablet [id number] No Take 0.5 mg by mouth at bedtime.  [provider] Taking Active Self  moxifloxacin (AVELOX) 400 MG tablet [id number]  1 tablet [provider]  Active   nitroGLYCERIN (NITROSTAT) 0.4 MG SL tablet [id number]  PLACE ONE tablet (0.4 MG total) UNDER THE TONGUE every FIVE (five) minutes as needed FOR CHEST PAIN. IF 3 TABLETS ARE NECESSARY CALL 911] Kevin Sark, MD  Active   ondansetron (ZOFRAN) 4 MG tablet [id number]  Take 4 mg by mouth every 6 (six) hours as needed. [provider]  Active   ondansetron (ZOFRAN) 8 MG tablet [id number]  1 tablet [provider]  Active   oxybutynin (DITROPAN-XL) 10 MG 24 hr tablet [id number] No Take 10 mg by mouth daily. [provider] Taking Active Self  oxyCODONE-acetaminophen (PERCOCET) 10-325 MG tablet [id number] No Take 1 tablet by mouth every 6 (six) hours as needed for pain. [name], Nishant, MD Taking Active Self  potassium chloride SA (K-DUR,KLOR-CON) 20 MEQ tablet [id number] No Take 20 mEq by mouth 2 (two) times daily.  [provider] Taking Active Self  pregabalin (LYRICA) 25 MG capsule [id number] No Take 25 mg by mouth 2 (two) times daily. [provider] Taking Active   Probiotic Product (PROBIOTIC PO) [id number] No Take 1 capsule by mouth daily. [provider] Taking Active Self  tamsulosin (FLOMAX) 0.4 MG CAPS capsule [id number] No Take 0.4  mg by mouth daily.  [provider] Taking Active Self  torsemide (DEMADEX) 20 MG tablet [id number] No TAKE THREE TABLETS BY MOUTH TWICE DAILY Kevin Sark, MD Taking Active   zolpidem (AMBIEN) 5 MG tablet [id number] No Take 5 mg by mouth at bedtime as needed. for insomnia [provider] Taking Active           Patient Active Problem List   Diagnosis Date Noted  . Benign prostatic hyperplasia 11/08/2020  . Body mass index (BMI) 38.0-38.9, adult 11/08/2020  . Chronic diastolic heart failure ([place]) 11/08/2020  . Chronic pain 11/08/2020  . Diabetic peripheral neuropathy associated with type 2 diabetes mellitus ([place]) 11/08/2020  . Diastolic dysfunction 01/75/1025  . Gastroesophageal reflux disease 11/08/2020  . Hearing loss 11/08/2020  . Incontinence of feces 11/08/2020  . Long term (current) use of insulin ([place]) 11/08/2020  . Loss of appetite 11/08/2020  . Nicotine dependence, unspecified, uncomplicated 85/27/7824  . Peripheral neuropathy 11/08/2020  . Personal history of other malignant neoplasm of kidney 11/08/2020  . Severe hyperglycemia due to diabetes mellitus ([place]) 11/08/2020  . Urinary tract obstruction 11/08/2020  . Asymptomatic varicose veins of bilateral lower extremities 11/08/2020  . Sepsis ([place]) 05/19/2019  . Sepsis with hypotension ([place])  12/23/2018  . Chronic respiratory failure with hypoxia ([place]) 12/23/2018  . UTI symptoms 11/08/2018  . Abdominal distention 11/08/2018  . Chest pain 11/08/2018  . Hyperglycemia 11/08/2018  . AKI (acute kidney injury) ([place]) 11/08/2018  . Acute metabolic encephalopathy 71/69/6789  . Polypharmacy 06/03/2018  . Diabetes mellitus due to underlying condition, uncontrolled, with hyperglycemia, with long-term current use of insulin ([place]) 06/03/2018  . Community acquired pneumonia 05/31/2018  . Acute on chronic respiratory failure with hypoxia ([place]) 05/31/2018  . Acute respiratory failure ([place])   . Decreased mobility  and endurance 07/19/2015  . CKD (chronic kidney disease) stage 3, GFR 30-59 ml/min ([name]) 07/06/2015  . Acute on chronic respiratory failure ([place]) 07/05/2015  . COPD with acute exacerbation ([place]) 07/05/2015  . Interstitial lung disease ([place]) 04/10/2015  . Cough syncope 04/10/2015  . Dyspnea 04/10/2015  . Atypical chest pain 02/28/2015  . Syncope 02/28/2015  . Orthostatic dizziness 02/25/2015  . Right leg pain 02/25/2015  . Right calf pain 02/14/2015  . Malaise and fatigue 02/14/2015  . 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[place]) 11/12/2014  . Smoking 11/12/2014  . History of stroke 10/24/2014  . Carotid disease, bilateral ([place]) 10/24/2014  . Orthostatic hypotension 10/24/2014  . Obstructive sleep apnea   . HLD (hyperlipidemia)   . Acute on chronic renal failure ([place])   . Renal cell carcinoma ([place])   . Metabolic encephalopathy   . Depression   . Anxiety state   . Noncompliance with medication regimen   . Carotid artery stenosis 10/08/2014  . Left sided numbness 09/26/2014  . Hyperreflexia 09/26/2014  . COLD (chronic obstructive lung disease) ([employer]) 08/16/2014  . ILD (interstitial lung disease) ([place]) 08/16/2014  . Dizziness and giddiness 08/16/2014  . Other specified hypotension 08/16/2014  . Pulmonary edema   . Other emphysema ([place])   . Snoring 04/30/2014  . Fluid overload 02/22/2014  . Renal neoplasm 02/21/2014  . Preoperative cardiovascular examination 02/07/2014  . Renal mass, right 12/21/2013  . Coronary atherosclerosis of native coronary artery   . Substance abuse ([place])   . Hyperlipidemia   . Diabetes mellitus type 2, uncontrolled, with cardiovascular complications 38/04/1750  . Tobacco abuse 06/01/2012  . H/O medication noncompliance 06/01/2012  . Cocaine abuse ([place]) 06/01/2012  . Essential hypertension, benign     Conditions to be addressed/monitored per PCPorder:  chronic healthcare management needs-patient is being followed by Palliative Care,   Care Plan :  General Plan of Care (Adult)  Updates made by Kevin Medicus, RN since 11/25/2020 12:00 AM    Problem: Quality of Life (General Plan of Care)     Long-Range Goal: Quality of Life Maintained Completed 11/25/2020  Start Date: 08/01/2020  Expected End Date: 01/08/2021  Recent Progress: On track  Priority: High  Note:   Current Barriers:  . Care Coordination needs related to palliative care engagement. [name] to independently consult with palliative care team.  Nurse Case Manager Clinical Goal(s):  [name] Over the next 14 days, patient will verbalize understanding of plan for palliative care follow up. [name] Update 10/08/20:  Patient is currently receiving Palliative Care services.  Interventions:  . Inter-disciplinary care team collaboration (see longitudinal plan of care) . Evaluation of current treatment plan related to palliative care  and patient's adherence to plan as established by provider. . Advised patient to anticipate call from [facility] to discuss plan. [name] with [name] regarding palliative care services. [name] Update 10/08/20:  Patient seen by [name] 09/30/20-has follow up appointment scheduled in  April.  Patient Goals/Self-Care Activities Over the next 14 days, patient will:  - receive follow up from [facility]. Update 08/06/20:  Patient is scheduled 08/20/20 at 0900 with [facility]. Update 09/10/20:  Patient rescheduled appointment to 10/02/20 at 3pm.  Patient's brother died 08/24/2020. Update 10-24-2020:  Patient seen by [name] 10/02/20.  Follow Up Plan: Patient continues to be followed by Palliative Care.  Patient is no longer a Managed Medicaid patient-has [place] Access, therefore will be followed by [facility] Access.

## 2020-11-25 NOTE — Patient Instructions (Signed)
Visit Information  [name] was given information about Medicaid Managed Care team care coordination services and verbally consented to engagement with the Medicaid Managed Care team.   Aida Raider RN, BSN [place] Management Coordinator - Managed [facility] High Risk [phone].  Following is a copy of your plan of care:  Patient Care Plan: General Plan of Care (Adult)    Problem Identified: Coping Skills (General Plan of Care)     Problem Identified: Quality of Life (General Plan of Care)     Long-Range Goal: Quality of Life Maintained Completed 11/25/2020  Start Date: 08/01/2020  Expected End Date: 01/08/2021  Recent Progress: On track  Priority: High  Note:   Current Barriers:  . Care Coordination needs related to palliative care engagement. [name] to independently consult with palliative care team.  Nurse Case Manager Clinical Goal(s):  [name] Over the next 14 days, patient will verbalize understanding of plan for palliative care follow up. [name] Update 15-Oct-2020:  Patient is currently receiving Palliative Care services.  Interventions:  . Inter-disciplinary care team collaboration (see longitudinal plan of care) . Evaluation of current treatment plan related to palliative care  and patient's adherence to plan as established by provider. . Advised patient to anticipate call from [facility] to discuss plan. [name] with [name] regarding palliative care services. [name] Update 10/15/2020:  Patient seen by [name] 09/30/20-has follow up appointment scheduled in April.  Patient Goals/Self-Care Activities Over the next 14 days, patient will:  - receive follow up from [facility]. Update 08/06/20:  Patient is scheduled 08/20/20 at 0900 with [facility]. Update 09/10/20:  Patient rescheduled appointment to 10/02/20 at 3pm.  Patient's brother died 08/15/20. Update 10/15/20:  Patient seen by [name] 10/02/20.  Follow Up Plan: Patient  continues to be followed by Palliative Care.  Patient is no longer a Managed Medicaid patient-has [place] Access, therefore will be followed by [facility] Access.

## 2020-11-27 DIAGNOSIS — E1140 Type 2 diabetes mellitus with diabetic neuropathy, unspecified: Secondary | ICD-10-CM | POA: Diagnosis not present

## 2020-11-27 DIAGNOSIS — J449 Chronic obstructive pulmonary disease, unspecified: Secondary | ICD-10-CM | POA: Diagnosis not present

## 2020-11-27 DIAGNOSIS — G629 Polyneuropathy, unspecified: Secondary | ICD-10-CM | POA: Diagnosis not present

## 2020-11-27 DIAGNOSIS — G8929 Other chronic pain: Secondary | ICD-10-CM | POA: Diagnosis not present

## 2020-12-06 ENCOUNTER — Ambulatory Visit (HOSPITAL_COMMUNITY)
Admission: RE | Admit: 2020-12-06 | Discharge: 2020-12-06 | Disposition: A | Discharge status: Home / Self Care | Payer: Medicare Other | Source: Ambulatory Visit | Attending: Primary Care | Admitting: Primary Care

## 2020-12-06 ENCOUNTER — Other Ambulatory Visit: Payer: Self-pay

## 2020-12-06 DIAGNOSIS — Z87891 Personal history of nicotine dependence: Secondary | ICD-10-CM | POA: Insufficient documentation

## 2020-12-06 DIAGNOSIS — I517 Cardiomegaly: Secondary | ICD-10-CM

## 2020-12-06 DIAGNOSIS — I288 Other diseases of pulmonary vessels: Secondary | ICD-10-CM | POA: Diagnosis not present

## 2020-12-06 DIAGNOSIS — I119 Hypertensive heart disease without heart failure: Secondary | ICD-10-CM | POA: Insufficient documentation

## 2020-12-06 DIAGNOSIS — G4730 Sleep apnea, unspecified: Secondary | ICD-10-CM | POA: Insufficient documentation

## 2020-12-06 DIAGNOSIS — I272 Pulmonary hypertension, unspecified: Secondary | ICD-10-CM | POA: Diagnosis not present

## 2020-12-06 DIAGNOSIS — F1410 Cocaine abuse, uncomplicated: Secondary | ICD-10-CM | POA: Diagnosis not present

## 2020-12-06 DIAGNOSIS — E785 Hyperlipidemia, unspecified: Secondary | ICD-10-CM | POA: Diagnosis not present

## 2020-12-06 DIAGNOSIS — I2510 Atherosclerotic heart disease of native coronary artery without angina pectoris: Secondary | ICD-10-CM | POA: Diagnosis not present

## 2020-12-06 DIAGNOSIS — K219 Gastro-esophageal reflux disease without esophagitis: Secondary | ICD-10-CM | POA: Diagnosis not present

## 2020-12-06 DIAGNOSIS — J449 Chronic obstructive pulmonary disease, unspecified: Secondary | ICD-10-CM | POA: Diagnosis not present

## 2020-12-06 DIAGNOSIS — I639 Cerebral infarction, unspecified: Secondary | ICD-10-CM | POA: Diagnosis not present

## 2020-12-06 DIAGNOSIS — E119 Type 2 diabetes mellitus without complications: Secondary | ICD-10-CM | POA: Insufficient documentation

## 2020-12-06 DIAGNOSIS — I10 Essential (primary) hypertension: Secondary | ICD-10-CM | POA: Insufficient documentation

## 2020-12-06 LAB — ECHOCARDIOGRAM COMPLETE
Area-P 1/2: 2.73 cm2
S' Lateral: 3.4 cm

## 2020-12-06 NOTE — Progress Notes (Signed)
  Echocardiogram 2D Echocardiogram has been performed.  [name] 12/06/2020, 12:20 PM

## 2020-12-09 ENCOUNTER — Telehealth: Payer: Self-pay | Admitting: Nurse Practitioner

## 2020-12-17 NOTE — Progress Notes (Deleted)
Cardiology Office Note  Date: 12/17/2020   ID: Kevin, [name] [birth year], MRN [id number]  PCP:  [name], PA-C (Inactive)  Cardiologist:  [name], MD Electrophysiologist:  None   No chief complaint on file.   History of Present Illness: [name] is a 65 y.o. male last seen in November 2021.  Past Medical History:  Diagnosis Date  . Anxiety   . Arthritis   . Cholecystitis, acute 12/20/2013   Lap chole 6/5  . Cocaine abuse ([place])   . COPD (chronic obstructive pulmonary disease) ([place])   . Coronary atherosclerosis of native coronary artery    a. 03/09/2013 Cath/PCI: LM nl, LAD: 50p, 12m (2.5x16 promus DES), LCX nl, OM1 min irregs, LPL/LPDA diff dzs, RCA nondom, mod diff dzs, EF 55%.  . Depression   . Essential hypertension   . GERD (gastroesophageal reflux disease)   . Headache   . Hepatitis Late 1970s  . History of nephrolithiasis   . History of pneumonia   . History of stroke    Right MCA distribution, residual left-sided weakness  . Hyperlipidemia   . Noncompliance   . Renal cell carcinoma [facility])    Status post radical right nephrectomy August 2015  . Sleep apnea    On CPAP, 4L O2 no cpap at home yet  . Type 2 diabetes mellitus ([place]) 2011    Past Surgical History:  Procedure Laterality Date  . APPENDECTOMY  1970's  . CHOLECYSTECTOMY N/A 12/22/2013   Procedure: LAPAROSCOPIC CHOLECYSTECTOMY ;  Surgeon: [name], MD;  Location: [place];  Service: General;  Laterality: N/A;  . ENDARTERECTOMY Right 10/08/2014   Procedure: Right ENDARTERECTOMY CAROTID;  Surgeon: [name], MD;  Location: [place];  Service: Vascular;  Laterality: Right;  . LAPAROSCOPIC LYSIS OF ADHESIONS  02/21/2014   Procedure: LAPAROSCOPIC LYSIS OF ADHESIONS EXTINSIVE;  Surgeon: [name], MD;  Location: [facility];  Service: Urology;;  . LEFT HEART CATHETERIZATION WITH CORONARY ANGIOGRAM N/A 06/02/2012   Procedure: LEFT HEART CATHETERIZATION WITH CORONARY ANGIOGRAM;   Surgeon: [name], MD;  Location: [facility] CATH LAB;  Service: Cardiovascular;  Laterality: N/A;  . LEFT HEART CATHETERIZATION WITH CORONARY ANGIOGRAM N/A 03/09/2013   Procedure: LEFT HEART CATHETERIZATION WITH CORONARY ANGIOGRAM;  Surgeon: [name], MD;  Location: [facility] CATH LAB;  Service: Cardiovascular;  Laterality: N/A;  . LEFT HEART CATHETERIZATION WITH CORONARY ANGIOGRAM N/A 12/20/2013   Procedure: LEFT HEART CATHETERIZATION WITH CORONARY ANGIOGRAM;  Surgeon: [name], MD;  Location: [facility] CATH LAB;  Service: Cardiovascular;  Laterality: N/A;  . LUNG BIOPSY Left 05/18/2014   Procedure: LUNG BIOPSY left upper lobe & left lower lobe;  Surgeon: [name], MD;  Location: [place];  Service: Thoracic;  Laterality: Left;  . PATCH ANGIOPLASTY Right 10/08/2014   Procedure: PATCH ANGIOPLASTY Right Carotid;  Surgeon: [name], MD;  Location: [place];  Service: Vascular;  Laterality: Right;  . ROBOT ASSISTED LAPAROSCOPIC NEPHRECTOMY Right 02/21/2014   Procedure: ROBOTIC ASSISTED LAPAROSCOPIC RIGHT NEPHRECTOMY ;  Surgeon: [name], MD;  Location: [facility];  Service: Urology;  Laterality: Right;  [name] VIDEO ASSISTED THORACOSCOPY Left 05/18/2014   Procedure: LEFT VIDEO ASSISTED THORACOSCOPY;  Surgeon: [name], MD;  Location: [name];  Service: Thoracic;  Laterality: Left;  [name] VIDEO BRONCHOSCOPY  05/18/2014   Procedure: VIDEO BRONCHOSCOPY;  Surgeon: [name], MD;  Location: [name];  Service: Thoracic;;    Current Outpatient Medications  Medication Sig  Dispense Refill  . acetaminophen (TYLENOL) 325 MG tablet 2 tablets as needed    . ALPRAZolam (XANAX) 0.25 MG tablet Take 0.25 mg by mouth 3 (three) times daily as needed.    . ALPRAZolam (XANAX) 0.5 MG tablet Take 1 tablet (0.5 mg total) by mouth 3 (three) times daily as needed for anxiety.  0  . atorvastatin (LIPITOR) 20 MG tablet Take 20 mg by mouth at bedtime.  1  . budesonide (PULMICORT) 1 MG/2ML nebulizer  solution 4 ml    . budesonide-formoterol (SYMBICORT) 160-4.5 MCG/ACT inhaler Inhale 2 puffs into the lungs 2 (two) times daily.    . clopidogrel (PLAVIX) 75 MG tablet TAKE ONE TABLET BY MOUTH EVERY DAY 90 tablet 1  . diltiazem (CARDIZEM CD) 180 MG 24 hr capsule Take 1 capsule (180 mg total) by mouth daily. 90 capsule 2  . diphenhydrAMINE (BENADRYL) 25 mg capsule 1 capsule as needed    . docusate sodium (COLACE) 100 MG capsule Take 200 mg by mouth at bedtime.     [name] esomeprazole (NEXIUM) 40 MG capsule Take 40 mg by mouth daily at 12 noon. Pt only take as needed    . FLUoxetine (PROZAC) 40 MG capsule Take 40 mg by mouth daily. Take with 10 mg capsule = 50 mg total    . formoterol (PERFOROMIST) 20 MCG/2ML nebulizer solution 2 ml    . gabapentin (NEURONTIN) 300 MG capsule Take 900 mg by mouth 2 (two) times daily.     [name] glucose blood (TRUE METRIX BLOOD GLUCOSE TEST) test strip     . Glycopyrrolate-Formoterol (BEVESPI AEROSPHERE) 9-4.8 MCG/ACT AERO Inhale 2 puffs into the lungs 2 (two) times daily. 1 Inhaler 11  . insulin aspart (NOVOLOG FLEXPEN) 100 UNIT/ML FlexPen 30 units 5-10 min before meals    . insulin lispro (HUMALOG) 100 UNIT/ML cartridge Inject 80 Units into the skin 3 (three) times daily with meals.    . insulin regular human CONCENTRATED (HUMULIN R U-500 KWIKPEN) 500 UNIT/ML kwikpen Inject into the skin.    [name] isosorbide mononitrate (IMDUR) 60 MG 24 hr tablet TAKE ONE TABLET BY MOUTH DAILY 90 tablet 1  . LANTUS 100 UNIT/ML injection SMARTSIG:115 Unit(s) SUB-Q Twice Daily    . linagliptin (TRADJENTA) 5 MG TABS tablet Take 5 mg by mouth daily.    [name] LORazepam (ATIVAN) 1 MG tablet Take 0.5 mg by mouth at bedtime.     . moxifloxacin (AVELOX) 400 MG tablet 1 tablet    . nitroGLYCERIN (NITROSTAT) 0.4 MG SL tablet PLACE ONE tablet (0.4 MG total) UNDER THE TONGUE every FIVE (five) minutes as needed FOR CHEST PAIN. IF 3 TABLETS ARE NECESSARY CALL 911] 25 tablet 3  . ondansetron (ZOFRAN) 4 MG tablet  Take 4 mg by mouth every 6 (six) hours as needed.    . ondansetron (ZOFRAN) 8 MG tablet 1 tablet    . oxybutynin (DITROPAN-XL) 10 MG 24 hr tablet Take 10 mg by mouth daily.  11  . oxyCODONE-acetaminophen (PERCOCET) 10-325 MG tablet Take 1 tablet by mouth every 6 (six) hours as needed for pain. 30 tablet 0  . potassium chloride SA (K-DUR,KLOR-CON) 20 MEQ tablet Take 20 mEq by mouth 2 (two) times daily.     . pregabalin (LYRICA) 25 MG capsule Take 25 mg by mouth 2 (two) times daily.    . Probiotic Product (PROBIOTIC PO) Take 1 capsule by mouth daily.    . tamsulosin (FLOMAX) 0.4 MG CAPS capsule Take 0.4 mg by mouth daily.     [name]  torsemide (DEMADEX) 20 MG tablet TAKE THREE TABLETS BY MOUTH TWICE DAILY 540 tablet 1  . zolpidem (AMBIEN) 5 MG tablet Take 5 mg by mouth at bedtime as needed. for insomnia     No current facility-administered medications for this visit.   Allergies:  Lisinopril and Oxycodone   Social History: The patient  reports that he has been smoking cigarettes. He started smoking about 48 years ago. He has a 41.00 pack-year smoking history. He has never used smokeless tobacco. He reports that he does not drink alcohol and does not use drugs.   Family History: The patient's family history includes CAD in his brother and father; Cancer in his maternal grandfather, maternal grandmother, and mother; Diabetes in an other family member; Hypertension in an other family member; Lung cancer in his father; Stroke in an other family member.   ROS:  Please see the history of present illness. Otherwise, complete review of systems is positive for {NONE DEFAULTED:18576::"none"}.  All other systems are reviewed and negative.   Physical Exam: VS:  There were no vitals taken for this visit., BMI There is no height or weight on file to calculate BMI.  Wt Readings from Last 3 Encounters:  06/10/20 228 lb (103.4 kg)  04/18/20 232 lb 6 oz (105.4 kg)  03/07/20 237 lb (107.5 kg)    General: Patient  appears comfortable at rest. HEENT: Conjunctiva and lids normal, oropharynx clear with moist mucosa. Neck: Supple, no elevated JVP or carotid bruits, no thyromegaly. Lungs: Clear to auscultation, nonlabored breathing at rest. Cardiac: Regular rate and rhythm, no S3 or significant systolic murmur, no pericardial rub. Abdomen: Soft, nontender, no hepatomegaly, bowel sounds present, no guarding or rebound. Extremities: No pitting edema, distal pulses 2+. Skin: Warm and dry. Musculoskeletal: No kyphosis. Neuropsychiatric: Alert and oriented x3, affect grossly appropriate.  ECG:  An ECG dated 03/07/2020 was personally reviewed today and demonstrated:  Sinus tachycardia with PAC, prolonged PR interval, nonspecific ST changes.  Recent Labwork: 01/05/2020: ALT 19; AST 23 04/12/2020: B Natriuretic Peptide 84.2; BUN 19; Creatinine, Ser 1.92; Hemoglobin 12.8; Platelets 215; Potassium 3.6; Sodium 136     Component Value Date/Time   CHOL 128 12/19/2013 0220   TRIG 152 (H) 12/19/2013 0220   HDL 31 (L) 12/19/2013 0220   CHOLHDL 4.1 12/19/2013 0220   VLDL 30 12/19/2013 0220   LDLCALC 67 12/19/2013 0220    Other Studies Reviewed Today:  Cardiac catheterization 12/20/2013: Hemodynamic Findings: Central aortic pressure: 120/62 Left ventricular pressure: 127/7/15  Angiographic Findings:  Left main: Short segment. No obstructive disease.   Left Anterior Descending Artery: Moderate caliber vessel that courses to the apex. There is 40% proximal stenosis. The mid stented segment is patent without restenosis. The distal vessel has diffuse non-obstructive plaque. The diagonal branches are small in caliber with no obstructive disease.   Circumflex Artery: Large dominant system with large bifurcating first obtuse marginal branch and three small caliber posterolateral branches. The OM branch has diffuse 50% proximal stenosis, no focally obstructive lesions. The three small caliber posterolateral branches  all have diffuse moderate to severe stenosis that is unchanged from last cath.   Right Coronary Artery: Small non-dominant vessel with 40% mid vessel stenosis.   Left Ventricular Angiogram: LVEF=65%  Impression: 1. Double vessel CAD with patent stent mid LAD 2. Moderate non-obstructive disease in the Circumflex and RCA 3. Normal LV systolic function  High-resolution chest CT 09/24/2020: IMPRESSION: 1. No evidence of interstitial lung disease. Air trapping is indicative of small  airways disease. 2. Pleuroparenchymal scarring and volume loss in the lower left hemithorax. 3. Aortic atherosclerosis (ICD10-I70.0). Coronary artery calcification. 4. Enlarged pulmonic trunk, indicative of pulmonary arterial hypertension. 5.  Emphysema (ICD10-J43.9).  Echocardiogram 12/06/2020: 1. Left ventricular ejection fraction, by estimation, is 55 to 60%. The  left ventricle has normal function. The left ventricle has no regional  wall motion abnormalities. There is mild left ventricular hypertrophy.  Left ventricular diastolic parameters  are consistent with Grade I diastolic dysfunction (impaired relaxation).  2. Right ventricular systolic function is normal. The right ventricular  size is normal. Tricuspid regurgitation signal is inadequate for assessing  PA pressure.  3. Left atrial size was mildly dilated.  4. The mitral valve is grossly normal. Trivial mitral valve  regurgitation.  5. The aortic valve is tricuspid. Aortic valve regurgitation is not  visualized.  6. Mildly dilated pulmonary artery.  7. The inferior vena cava is dilated in size with >50% respiratory  variability, suggesting right atrial pressure of 8 mmHg.   Assessment and Plan:    Medication Adjustments/Labs and Tests Ordered: Current medicines are reviewed at length with the patient today.  Concerns regarding medicines are outlined above.   Tests Ordered: No orders of the defined types were placed in this  encounter.   Medication Changes: No orders of the defined types were placed in this encounter.   Disposition:  Follow up {follow up:15908}  Signed, Satira Sark, MD, [facility] 12/17/2020 11:35 AM    [name] [place], [place], [place] [address] Phone: [phone]; Fax: [phone]

## 2020-12-18 ENCOUNTER — Ambulatory Visit: Payer: Medicaid Other | Admitting: Cardiology

## 2020-12-18 DIAGNOSIS — J449 Chronic obstructive pulmonary disease, unspecified: Secondary | ICD-10-CM | POA: Diagnosis not present

## 2020-12-18 DIAGNOSIS — I25119 Atherosclerotic heart disease of native coronary artery with unspecified angina pectoris: Secondary | ICD-10-CM

## 2020-12-20 ENCOUNTER — Institutional Professional Consult (permissible substitution): Payer: Medicare Other | Admitting: Pulmonary Disease

## 2020-12-23 ENCOUNTER — Other Ambulatory Visit: Payer: Self-pay | Admitting: Cardiology

## 2020-12-24 ENCOUNTER — Emergency Department (HOSPITAL_BASED_OUTPATIENT_CLINIC_OR_DEPARTMENT_OTHER): Payer: Medicare Other | Admitting: Radiology

## 2020-12-24 ENCOUNTER — Other Ambulatory Visit: Payer: Self-pay

## 2020-12-24 ENCOUNTER — Emergency Department (HOSPITAL_BASED_OUTPATIENT_CLINIC_OR_DEPARTMENT_OTHER): Payer: Medicare Other

## 2020-12-24 ENCOUNTER — Inpatient Hospital Stay (HOSPITAL_BASED_OUTPATIENT_CLINIC_OR_DEPARTMENT_OTHER)
Admission: EM | Admit: 2020-12-24 | Discharge: 2020-12-28 | DRG: 617 | Disposition: A | Discharge status: Home / Self Care | Payer: Medicare Other | Attending: Internal Medicine | Admitting: Internal Medicine

## 2020-12-24 DIAGNOSIS — L97509 Non-pressure chronic ulcer of other part of unspecified foot with unspecified severity: Secondary | ICD-10-CM | POA: Diagnosis present

## 2020-12-24 DIAGNOSIS — Z8616 Personal history of COVID-19: Secondary | ICD-10-CM | POA: Diagnosis not present

## 2020-12-24 DIAGNOSIS — E875 Hyperkalemia: Secondary | ICD-10-CM | POA: Diagnosis present

## 2020-12-24 DIAGNOSIS — Z79899 Other long term (current) drug therapy: Secondary | ICD-10-CM

## 2020-12-24 DIAGNOSIS — E1169 Type 2 diabetes mellitus with other specified complication: Secondary | ICD-10-CM | POA: Diagnosis not present

## 2020-12-24 DIAGNOSIS — Z9981 Dependence on supplemental oxygen: Secondary | ICD-10-CM

## 2020-12-24 DIAGNOSIS — Z89431 Acquired absence of right foot: Secondary | ICD-10-CM

## 2020-12-24 DIAGNOSIS — J9611 Chronic respiratory failure with hypoxia: Secondary | ICD-10-CM | POA: Diagnosis present

## 2020-12-24 DIAGNOSIS — E871 Hypo-osmolality and hyponatremia: Secondary | ICD-10-CM | POA: Diagnosis present

## 2020-12-24 DIAGNOSIS — Z7902 Long term (current) use of antithrombotics/antiplatelets: Secondary | ICD-10-CM

## 2020-12-24 DIAGNOSIS — N1832 Chronic kidney disease, stage 3b: Secondary | ICD-10-CM | POA: Diagnosis present

## 2020-12-24 DIAGNOSIS — Z6834 Body mass index (BMI) 34.0-34.9, adult: Secondary | ICD-10-CM | POA: Diagnosis not present

## 2020-12-24 DIAGNOSIS — I5032 Chronic diastolic (congestive) heart failure: Secondary | ICD-10-CM | POA: Diagnosis not present

## 2020-12-24 DIAGNOSIS — Z801 Family history of malignant neoplasm of trachea, bronchus and lung: Secondary | ICD-10-CM

## 2020-12-24 DIAGNOSIS — E669 Obesity, unspecified: Secondary | ICD-10-CM | POA: Diagnosis present

## 2020-12-24 DIAGNOSIS — K219 Gastro-esophageal reflux disease without esophagitis: Secondary | ICD-10-CM | POA: Diagnosis present

## 2020-12-24 DIAGNOSIS — M869 Osteomyelitis, unspecified: Secondary | ICD-10-CM | POA: Diagnosis not present

## 2020-12-24 DIAGNOSIS — Z955 Presence of coronary angioplasty implant and graft: Secondary | ICD-10-CM

## 2020-12-24 DIAGNOSIS — Z833 Family history of diabetes mellitus: Secondary | ICD-10-CM | POA: Diagnosis not present

## 2020-12-24 DIAGNOSIS — L03119 Cellulitis of unspecified part of limb: Secondary | ICD-10-CM | POA: Diagnosis not present

## 2020-12-24 DIAGNOSIS — T380X5A Adverse effect of glucocorticoids and synthetic analogues, initial encounter: Secondary | ICD-10-CM | POA: Diagnosis present

## 2020-12-24 DIAGNOSIS — Z8673 Personal history of transient ischemic attack (TIA), and cerebral infarction without residual deficits: Secondary | ICD-10-CM

## 2020-12-24 DIAGNOSIS — I10 Essential (primary) hypertension: Secondary | ICD-10-CM | POA: Diagnosis not present

## 2020-12-24 DIAGNOSIS — E861 Hypovolemia: Secondary | ICD-10-CM | POA: Diagnosis present

## 2020-12-24 DIAGNOSIS — I2510 Atherosclerotic heart disease of native coronary artery without angina pectoris: Secondary | ICD-10-CM | POA: Diagnosis present

## 2020-12-24 DIAGNOSIS — E1122 Type 2 diabetes mellitus with diabetic chronic kidney disease: Secondary | ICD-10-CM | POA: Diagnosis not present

## 2020-12-24 DIAGNOSIS — I700 Atherosclerosis of aorta: Secondary | ICD-10-CM | POA: Diagnosis not present

## 2020-12-24 DIAGNOSIS — I130 Hypertensive heart and chronic kidney disease with heart failure and stage 1 through stage 4 chronic kidney disease, or unspecified chronic kidney disease: Secondary | ICD-10-CM | POA: Diagnosis not present

## 2020-12-24 DIAGNOSIS — E11621 Type 2 diabetes mellitus with foot ulcer: Secondary | ICD-10-CM | POA: Diagnosis not present

## 2020-12-24 DIAGNOSIS — F17210 Nicotine dependence, cigarettes, uncomplicated: Secondary | ICD-10-CM | POA: Diagnosis not present

## 2020-12-24 DIAGNOSIS — Z8249 Family history of ischemic heart disease and other diseases of the circulatory system: Secondary | ICD-10-CM

## 2020-12-24 DIAGNOSIS — R600 Localized edema: Secondary | ICD-10-CM | POA: Diagnosis not present

## 2020-12-24 DIAGNOSIS — N400 Enlarged prostate without lower urinary tract symptoms: Secondary | ICD-10-CM | POA: Diagnosis present

## 2020-12-24 DIAGNOSIS — L089 Local infection of the skin and subcutaneous tissue, unspecified: Secondary | ICD-10-CM | POA: Diagnosis not present

## 2020-12-24 DIAGNOSIS — Z823 Family history of stroke: Secondary | ICD-10-CM

## 2020-12-24 DIAGNOSIS — E785 Hyperlipidemia, unspecified: Secondary | ICD-10-CM | POA: Diagnosis present

## 2020-12-24 DIAGNOSIS — Z794 Long term (current) use of insulin: Secondary | ICD-10-CM | POA: Diagnosis not present

## 2020-12-24 DIAGNOSIS — S98912A Complete traumatic amputation of left foot, level unspecified, initial encounter: Secondary | ICD-10-CM | POA: Diagnosis not present

## 2020-12-24 DIAGNOSIS — E118 Type 2 diabetes mellitus with unspecified complications: Secondary | ICD-10-CM | POA: Diagnosis not present

## 2020-12-24 DIAGNOSIS — E1165 Type 2 diabetes mellitus with hyperglycemia: Secondary | ICD-10-CM | POA: Diagnosis present

## 2020-12-24 DIAGNOSIS — R000 Tachycardia, unspecified: Secondary | ICD-10-CM | POA: Diagnosis not present

## 2020-12-24 DIAGNOSIS — R202 Paresthesia of skin: Secondary | ICD-10-CM | POA: Diagnosis not present

## 2020-12-24 DIAGNOSIS — R5381 Other malaise: Secondary | ICD-10-CM | POA: Diagnosis present

## 2020-12-24 DIAGNOSIS — I70213 Atherosclerosis of native arteries of extremities with intermittent claudication, bilateral legs: Secondary | ICD-10-CM | POA: Diagnosis not present

## 2020-12-24 DIAGNOSIS — E1142 Type 2 diabetes mellitus with diabetic polyneuropathy: Secondary | ICD-10-CM | POA: Diagnosis not present

## 2020-12-24 DIAGNOSIS — L97529 Non-pressure chronic ulcer of other part of left foot with unspecified severity: Secondary | ICD-10-CM | POA: Diagnosis not present

## 2020-12-24 DIAGNOSIS — Z85528 Personal history of other malignant neoplasm of kidney: Secondary | ICD-10-CM

## 2020-12-24 DIAGNOSIS — L02619 Cutaneous abscess of unspecified foot: Secondary | ICD-10-CM | POA: Diagnosis not present

## 2020-12-24 DIAGNOSIS — J9811 Atelectasis: Secondary | ICD-10-CM | POA: Diagnosis not present

## 2020-12-24 DIAGNOSIS — M86172 Other acute osteomyelitis, left ankle and foot: Secondary | ICD-10-CM | POA: Diagnosis not present

## 2020-12-24 DIAGNOSIS — Z7984 Long term (current) use of oral hypoglycemic drugs: Secondary | ICD-10-CM | POA: Diagnosis not present

## 2020-12-24 DIAGNOSIS — U071 COVID-19: Secondary | ICD-10-CM | POA: Diagnosis not present

## 2020-12-24 DIAGNOSIS — J449 Chronic obstructive pulmonary disease, unspecified: Secondary | ICD-10-CM | POA: Diagnosis not present

## 2020-12-24 DIAGNOSIS — Z905 Acquired absence of kidney: Secondary | ICD-10-CM

## 2020-12-24 DIAGNOSIS — E1151 Type 2 diabetes mellitus with diabetic peripheral angiopathy without gangrene: Secondary | ICD-10-CM | POA: Diagnosis not present

## 2020-12-24 LAB — CBC WITH DIFFERENTIAL/PLATELET
Abs Immature Granulocytes: 0.12 10*3/uL — ABNORMAL HIGH (ref 0.00–0.07)
Basophils Absolute: 0 10*3/uL (ref 0.0–0.1)
Basophils Relative: 0 %
Eosinophils Absolute: 0 10*3/uL (ref 0.0–0.5)
Eosinophils Relative: 0 %
HCT: 38.8 % — ABNORMAL LOW (ref 39.0–52.0)
Hemoglobin: 13 g/dL (ref 13.0–17.0)
Immature Granulocytes: 1 %
Lymphocytes Relative: 9 %
Lymphs Abs: 1.1 10*3/uL (ref 0.7–4.0)
MCH: 27.1 pg (ref 26.0–34.0)
MCHC: 33.5 g/dL (ref 30.0–36.0)
MCV: 80.8 fL (ref 80.0–100.0)
Monocytes Absolute: 0.8 10*3/uL (ref 0.1–1.0)
Monocytes Relative: 7 %
Neutro Abs: 10.5 10*3/uL — ABNORMAL HIGH (ref 1.7–7.7)
Neutrophils Relative %: 83 %
Platelets: 213 10*3/uL (ref 150–400)
RBC: 4.8 MIL/uL (ref 4.22–5.81)
RDW: 13.8 % (ref 11.5–15.5)
WBC: 12.6 10*3/uL — ABNORMAL HIGH (ref 4.0–10.5)
nRBC: 0 % (ref 0.0–0.2)

## 2020-12-24 LAB — RESP PANEL BY RT-PCR (FLU A&B, COVID) ARPGX2
Influenza A by PCR: NEGATIVE
Influenza B by PCR: NEGATIVE
SARS Coronavirus 2 by RT PCR: POSITIVE — AB

## 2020-12-24 LAB — URINALYSIS, ROUTINE W REFLEX MICROSCOPIC
Bilirubin Urine: NEGATIVE
Glucose, UA: NEGATIVE mg/dL
Hgb urine dipstick: NEGATIVE
Ketones, ur: NEGATIVE mg/dL
Leukocytes,Ua: NEGATIVE
Nitrite: NEGATIVE
Protein, ur: NEGATIVE mg/dL
Specific Gravity, Urine: 1.006 (ref 1.005–1.030)
pH: 5 (ref 5.0–8.0)

## 2020-12-24 LAB — COMPREHENSIVE METABOLIC PANEL
ALT: 8 U/L (ref 0–44)
AST: 7 U/L — ABNORMAL LOW (ref 15–41)
Albumin: 3.8 g/dL (ref 3.5–5.0)
Alkaline Phosphatase: 81 U/L (ref 38–126)
Anion gap: 13 (ref 5–15)
BUN: 24 mg/dL — ABNORMAL HIGH (ref 8–23)
CO2: 22 mmol/L (ref 22–32)
Calcium: 9.1 mg/dL (ref 8.9–10.3)
Chloride: 95 mmol/L — ABNORMAL LOW (ref 98–111)
Creatinine, Ser: 1.81 mg/dL — ABNORMAL HIGH (ref 0.61–1.24)
GFR, Estimated: 41 mL/min — ABNORMAL LOW (ref >60)
Glucose, Bld: 238 mg/dL — ABNORMAL HIGH (ref 70–99)
Potassium: 3.8 mmol/L (ref 3.5–5.1)
Sodium: 130 mmol/L — ABNORMAL LOW (ref 135–145)
Total Bilirubin: 0.7 mg/dL (ref 0.3–1.2)
Total Protein: 7.7 g/dL (ref 6.5–8.1)

## 2020-12-24 LAB — LACTIC ACID, PLASMA
Lactic Acid, Venous: 2.2 mmol/L (ref 0.5–1.9)
Lactic Acid, Venous: 1.8 mmol/L (ref 0.5–1.9)

## 2020-12-24 LAB — PROTIME-INR
INR: 1.1 (ref 0.8–1.2)
Prothrombin Time: 13.8 seconds (ref 11.4–15.2)

## 2020-12-24 LAB — APTT: aPTT: 34 seconds (ref 24–36)

## 2020-12-24 LAB — GLUCOSE, CAPILLARY: Glucose-Capillary: 275 mg/dL — ABNORMAL HIGH (ref 70–99)

## 2020-12-24 LAB — CBG MONITORING, ED: Glucose-Capillary: 284 mg/dL — ABNORMAL HIGH (ref 70–99)

## 2020-12-24 MED ORDER — DEXAMETHASONE 4 MG PO TABS: 6.0000 mg | ORAL_TABLET | ORAL | Status: DC

## 2020-12-24 MED ORDER — ZINC SULFATE 220 (50 ZN) MG PO CAPS
220.0000 mg | ORAL_CAPSULE | Freq: Every day | ORAL | Status: DC
  Administered 2020-12-25 – 2020-12-28 (×4): 220 mg via ORAL
  Filled 2020-12-24 (×4): qty 1

## 2020-12-24 MED ORDER — VITAMIN D 25 MCG (1000 UNIT) PO TABS
1000.0000 [IU] | ORAL_TABLET | Freq: Every day | ORAL | Status: DC
  Administered 2020-12-25 – 2020-12-28 (×4): 1000 [IU] via ORAL
  Filled 2020-12-24 (×4): qty 1

## 2020-12-24 MED ORDER — THIAMINE HCL 100 MG PO TABS
100.0000 mg | ORAL_TABLET | Freq: Every day | ORAL | Status: DC
  Administered 2020-12-25 – 2020-12-28 (×4): 100 mg via ORAL
  Filled 2020-12-24 (×4): qty 1

## 2020-12-24 MED ORDER — PANTOPRAZOLE SODIUM 40 MG PO TBEC
40.0000 mg | DELAYED_RELEASE_TABLET | Freq: Every day | ORAL | Status: DC
  Administered 2020-12-25 – 2020-12-28 (×4): 40 mg via ORAL
  Filled 2020-12-24 (×4): qty 1

## 2020-12-24 MED ORDER — INSULIN ASPART 100 UNIT/ML IJ SOLN: 4.0000 [IU] | Freq: Three times a day (TID) | INTRAMUSCULAR | Status: DC

## 2020-12-24 MED ORDER — CLOPIDOGREL BISULFATE 75 MG PO TABS
75.0000 mg | ORAL_TABLET | Freq: Every day | ORAL | Status: DC
  Administered 2020-12-25 – 2020-12-28 (×4): 75 mg via ORAL
  Filled 2020-12-24 (×4): qty 1

## 2020-12-24 MED ORDER — DEXAMETHASONE 4 MG PO TABS
6.0000 mg | ORAL_TABLET | Freq: Once | ORAL | Status: AC
  Administered 2020-12-24: 6 mg via ORAL
  Filled 2020-12-24: qty 2

## 2020-12-24 MED ORDER — INSULIN ASPART 100 UNIT/ML IJ SOLN: 0.0000 [IU] | Freq: Three times a day (TID) | INTRAMUSCULAR | Status: DC

## 2020-12-24 MED ORDER — SODIUM CHLORIDE 0.9 % IV SOLN: 100.0000 mg | Freq: Every day | INTRAVENOUS | Status: DC

## 2020-12-24 MED ORDER — SODIUM CHLORIDE 0.9 % IV SOLN
200.0000 mg | Freq: Once | INTRAVENOUS | Status: DC
  Filled 2020-12-24: qty 40

## 2020-12-24 MED ORDER — TAMSULOSIN HCL 0.4 MG PO CAPS
0.4000 mg | ORAL_CAPSULE | Freq: Every day | ORAL | Status: DC
  Administered 2020-12-25 – 2020-12-28 (×4): 0.4 mg via ORAL
  Filled 2020-12-24 (×4): qty 1

## 2020-12-24 MED ORDER — GABAPENTIN 300 MG PO CAPS
300.0000 mg | ORAL_CAPSULE | Freq: Two times a day (BID) | ORAL | Status: DC
  Administered 2020-12-25 – 2020-12-28 (×8): 300 mg via ORAL
  Filled 2020-12-24 (×8): qty 1

## 2020-12-24 MED ORDER — FOLIC ACID 1 MG PO TABS
1.0000 mg | ORAL_TABLET | Freq: Every day | ORAL | Status: DC
  Administered 2020-12-25 – 2020-12-28 (×4): 1 mg via ORAL
  Filled 2020-12-24 (×4): qty 1

## 2020-12-24 MED ORDER — GUAIFENESIN-DM 100-10 MG/5ML PO SYRP: 10.0000 mL | ORAL_SOLUTION | ORAL | Status: DC | PRN

## 2020-12-24 MED ORDER — ATORVASTATIN CALCIUM 10 MG PO TABS
20.0000 mg | ORAL_TABLET | Freq: Every day | ORAL | Status: DC
  Administered 2020-12-24 – 2020-12-27 (×4): 20 mg via ORAL
  Filled 2020-12-24 (×4): qty 2

## 2020-12-24 MED ORDER — INSULIN GLARGINE 100 UNIT/ML ~~LOC~~ SOLN
10.0000 [IU] | Freq: Two times a day (BID) | SUBCUTANEOUS | Status: DC
  Administered 2020-12-24 – 2020-12-25 (×2): 10 [IU] via SUBCUTANEOUS
  Filled 2020-12-24 (×4): qty 0.1

## 2020-12-24 MED ORDER — MOMETASONE FURO-FORMOTEROL FUM 200-5 MCG/ACT IN AERO
2.0000 | INHALATION_SPRAY | Freq: Two times a day (BID) | RESPIRATORY_TRACT | Status: DC
  Administered 2020-12-25 – 2020-12-28 (×7): 2 via RESPIRATORY_TRACT
  Filled 2020-12-24: qty 8.8

## 2020-12-24 MED ORDER — CEFAZOLIN SODIUM-DEXTROSE 2-4 GM/100ML-% IV SOLN
2.0000 g | Freq: Three times a day (TID) | INTRAVENOUS | Status: DC
  Administered 2020-12-24 – 2020-12-28 (×12): 2 g via INTRAVENOUS
  Filled 2020-12-24 (×16): qty 100

## 2020-12-24 MED ORDER — VANCOMYCIN HCL IN DEXTROSE 1-5 GM/200ML-% IV SOLN
1000.0000 mg | Freq: Once | INTRAVENOUS | Status: AC
  Administered 2020-12-24: 1000 mg via INTRAVENOUS
  Filled 2020-12-24: qty 200

## 2020-12-24 MED ORDER — ENOXAPARIN SODIUM 40 MG/0.4ML IJ SOSY
40.0000 mg | PREFILLED_SYRINGE | INTRAMUSCULAR | Status: DC
  Administered 2020-12-25 – 2020-12-28 (×4): 40 mg via SUBCUTANEOUS
  Filled 2020-12-24 (×4): qty 0.4

## 2020-12-24 MED ORDER — ADULT MULTIVITAMIN W/MINERALS CH
1.0000 | ORAL_TABLET | Freq: Every day | ORAL | Status: DC
  Administered 2020-12-25 – 2020-12-28 (×4): 1 via ORAL
  Filled 2020-12-24 (×4): qty 1

## 2020-12-24 MED ORDER — ALPRAZOLAM 0.25 MG PO TABS: 0.2500 mg | ORAL_TABLET | Freq: Two times a day (BID) | ORAL | Status: DC | PRN

## 2020-12-24 MED ORDER — ZOLPIDEM TARTRATE 5 MG PO TABS: 5.0000 mg | ORAL_TABLET | Freq: Every evening | ORAL | Status: DC | PRN

## 2020-12-24 MED ORDER — INSULIN ASPART 100 UNIT/ML IJ SOLN
0.0000 [IU] | Freq: Every day | INTRAMUSCULAR | Status: DC
  Administered 2020-12-24: 3 [IU] via SUBCUTANEOUS
  Administered 2020-12-25: 2 [IU] via SUBCUTANEOUS

## 2020-12-24 MED ORDER — VANCOMYCIN HCL IN DEXTROSE 1-5 GM/200ML-% IV SOLN: 1000.0000 mg | Freq: Once | INTRAVENOUS | Status: DC

## 2020-12-24 MED ORDER — SODIUM CHLORIDE 0.9 % IV BOLUS (SEPSIS)
500.0000 mL | Freq: Once | INTRAVENOUS | Status: AC
  Administered 2020-12-24: 500 mL via INTRAVENOUS

## 2020-12-24 MED ORDER — LACTATED RINGERS IV SOLN: INTRAVENOUS | Status: AC

## 2020-12-24 MED ORDER — ASCORBIC ACID 500 MG PO TABS
500.0000 mg | ORAL_TABLET | Freq: Every day | ORAL | Status: DC
  Administered 2020-12-25 – 2020-12-28 (×4): 500 mg via ORAL
  Filled 2020-12-24 (×4): qty 1

## 2020-12-24 NOTE — ED Notes (Signed)
Pt has been transferred to  [facility]  Via [facility]

## 2020-12-24 NOTE — ED Provider Notes (Signed)
Emergency Department Provider Note   I have reviewed the triage vital signs and the nursing notes.   HISTORY  Chief Complaint diabetic foot ulcer   HPI [name] is a 65 y.o. male with past medical history reviewed below including COPD on 6 L nasal cannula oxygen at all times, diabetes, CAD presents to the emergency department with worsening wound to the left foot.  He has had a wound in this area for the last month and reports following with [name] with [facility] at the [facility] location.  He was seen there today in the office and referred to the emergency department for IV antibiotics and evaluation of possible osteomyelitis.  Patient states that he is feeling somewhat more short of breath and has begun to notice swelling along with redness in the foot on the left.  He denies chest pain.  He did have some subjective fever last night along with nausea but no vomiting. Has not been on outpatient abx.   Past Medical History:  Diagnosis Date   Anxiety    Arthritis    Cholecystitis, acute 12/20/2013   Lap chole 6/5   Cocaine abuse ([name])    COPD (chronic obstructive pulmonary disease) ([name])    Coronary atherosclerosis of native coronary artery    a. 03/09/2013 Cath/PCI: LM nl, LAD: 50p, 85m (2.5x16 promus DES), LCX nl, OM1 min irregs, LPL/LPDA diff dzs, RCA nondom, mod diff dzs, EF 55%.   Depression    Essential hypertension    GERD (gastroesophageal reflux disease)    Headache    Hepatitis Late 1970s   History of nephrolithiasis    History of pneumonia    History of stroke    Right MCA distribution, residual left-sided weakness   Hyperlipidemia    Noncompliance    Renal cell carcinoma ([name])    Status post radical right nephrectomy August 2015   Sleep apnea    On CPAP, 4L O2 no cpap at home yet   Type 2 diabetes mellitus ([place]) 2011    Patient Active Problem List   Diagnosis Date Noted   Benign prostatic hyperplasia 11/08/2020   Body mass index (BMI)  38.0-38.9, adult 11/08/2020   Chronic diastolic heart failure ([place]) 11/08/2020   Chronic pain 11/08/2020   Diabetic peripheral neuropathy associated with type 2 diabetes mellitus ([place]) 86/76/1950   Diastolic dysfunction 93/26/7124   Gastroesophageal reflux disease 11/08/2020   Hearing loss 11/08/2020   Incontinence of feces 11/08/2020   [name] term (current) use of insulin ([place]) 11/08/2020   Loss of appetite 11/08/2020   Nicotine dependence, unspecified, uncomplicated 58/03/9832   Peripheral neuropathy 11/08/2020   Personal history of other malignant neoplasm of kidney 11/08/2020   Severe hyperglycemia due to diabetes mellitus ([place]) 11/08/2020   Urinary tract obstruction 11/08/2020   Asymptomatic varicose veins of bilateral lower extremities 11/08/2020   Sepsis ([place]) 05/19/2019   Sepsis with hypotension ([place]) 12/23/2018   Chronic respiratory failure with hypoxia ([place]) 12/23/2018   UTI symptoms 11/08/2018   Abdominal distention 11/08/2018   Chest pain 11/08/2018   Hyperglycemia 11/08/2018   AKI (acute kidney injury) ([place]) 82/50/5397   Acute metabolic encephalopathy 67/34/1937   Polypharmacy 06/03/2018   Diabetes mellitus due to underlying condition, uncontrolled, with hyperglycemia, with [name]-term current use of insulin ([place]) 06/03/2018   Community acquired pneumonia 05/31/2018   Acute on chronic respiratory failure with hypoxia ([place]) 05/31/2018   Acute respiratory failure ([name])    Decreased mobility and endurance 07/19/2015  CKD (chronic kidney disease) stage 3, GFR 30-59 ml/min ([name]) 07/06/2015   Acute on chronic respiratory failure ([place]) 07/05/2015   COPD with acute exacerbation ([place]) 07/05/2015   Interstitial lung disease ([place]) 04/10/2015   Cough syncope 04/10/2015   Dyspnea 04/10/2015   Atypical chest pain 02/28/2015   Syncope 02/28/2015   Orthostatic dizziness 02/25/2015   Right leg pain 02/25/2015   Right calf pain 02/14/2015   Malaise and fatigue 02/14/2015    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[place]) 11/12/2014   Smoking 11/12/2014   History of stroke 10/24/2014   Carotid disease, bilateral ([place]) 10/24/2014   Orthostatic hypotension 10/24/2014   Obstructive sleep apnea    HLD (hyperlipidemia)    Acute on chronic renal failure ([name])    Renal cell carcinoma ([name])    Metabolic encephalopathy    Depression    Anxiety state    Noncompliance with medication regimen    Carotid artery stenosis 10/08/2014   Left sided numbness 09/26/2014   Hyperreflexia 09/26/2014   COLD (chronic obstructive lung disease) ([place]) 08/16/2014   ILD (interstitial lung disease) ([place]) 08/16/2014   Dizziness and giddiness 08/16/2014   Other specified hypotension 08/16/2014   Pulmonary edema    Other emphysema ([place])    Snoring 04/30/2014   Fluid overload 02/22/2014   Renal neoplasm 02/21/2014   Preoperative cardiovascular examination 02/07/2014   Renal mass, right 12/21/2013   Coronary atherosclerosis of native coronary artery    Substance abuse ([place])    Hyperlipidemia    Diabetes mellitus type 2, uncontrolled, with cardiovascular complications 30/03/2329   Tobacco abuse 06/01/2012   H/O medication noncompliance 06/01/2012   Cocaine abuse ([place]) 06/01/2012   Essential hypertension, benign     Past Surgical History:  Procedure Laterality Date   APPENDECTOMY  1970's   CHOLECYSTECTOMY N/A 12/22/2013   Procedure: LAPAROSCOPIC CHOLECYSTECTOMY ;  Surgeon: [name], MD;  Location: [place];  Service: General;  Laterality: N/A;   ENDARTERECTOMY Right 10/08/2014   Procedure: Right ENDARTERECTOMY CAROTID;  Surgeon: [name], MD;  Location: [place];  Service: Vascular;  Laterality: Right;   LAPAROSCOPIC LYSIS OF ADHESIONS  02/21/2014   Procedure: LAPAROSCOPIC LYSIS OF ADHESIONS EXTINSIVE;  Surgeon: [name], MD;  Location: [facility];  Service: Urology;;   LEFT HEART CATHETERIZATION WITH CORONARY ANGIOGRAM N/A 06/02/2012   Procedure: LEFT HEART CATHETERIZATION  WITH CORONARY ANGIOGRAM;  Surgeon: [name], MD;  [facility] CATH LAB;  Service: Cardiovascular;  Laterality: N/A;   LEFT HEART CATHETERIZATION WITH CORONARY ANGIOGRAM N/A 03/09/2013   Procedure: LEFT HEART CATHETERIZATION WITH CORONARY ANGIOGRAM;  Surgeon: [name], MD;  Location: [facility] C[facility] LAB;  Service: Cardiovascular;  Laterality: N/A;   LEFT HEART CATHETERIZATION WITH CORONARY ANGIOGRAM N/A 12/20/2013   Procedure: LEFT HEART CATHETERIZATION WITH CORONARY ANGIOGRAM;  Surgeon: [name], MD;  Location: [facility] CATH LAB;  Service: Cardiovascular;  Laterality: N/A;   LUNG BIOPSY Left 05/18/2014   Procedure: LUNG BIOPSY left upper lobe & left lower lobe;  Surgeon: [name], MD;  Location: [place];  Service: Thoracic;  Laterality: Left;   PATCH ANGIOPLASTY Right 10/08/2014   Procedure: PATCH ANGIOPLASTY Right Carotid;  Surgeon: [name], MD;  Location: [place];  Service: Vascular;  Laterality: Right;   ROBOT ASSISTED LAPAROSCOPIC NEPHRECTOMY Right 02/21/2014   Procedure: ROBOTIC ASSISTED LAPAROSCOPIC RIGHT NEPHRECTOMY ;  Surgeon: [name], MD;  Location: [facility];  Service: Urology;  Laterality: Right;   VIDEO ASSISTED THORACOSCOPY Left 05/18/2014   Procedure:  LEFT VIDEO ASSISTED THORACOSCOPY;  Surgeon: [name], MD;  Location: [place];  Service: Thoracic;  Laterality: Left;   VIDEO BRONCHOSCOPY  05/18/2014   Procedure: VIDEO BRONCHOSCOPY;  Surgeon: [name], MD;  [facility];  Service: Thoracic;;    Allergies Lisinopril and Oxycodone  Family History  Problem Relation Age of Onset   Cancer Mother        Thyroid - living in her 72's.   Hypertension Other    Diabetes Other    Stroke Other    Lung cancer Father    CAD Father    Cancer Maternal Grandmother        Breast   Cancer Maternal Grandfather        Throat and stomach   CAD Brother     Social History Social History   Tobacco Use   Smoking status: Current Some Day  Smoker    Packs/day: 1.00    Years: 41.00    Pack years: 41.00    Types: Cigarettes    Start date: 05/03/1972   Smokeless tobacco: Never Used   Tobacco comment: 2-3 per day as of 03/07/2020  Vaping Use   Vaping Use: Never used  Substance Use Topics   Alcohol use: No    Alcohol/week: 0.0 standard drinks   Drug use: No    Types: Cocaine    Comment: last used cocaine 11/2013. but is cocaine + this admit July 2015    Review of Systems  Constitutional: Subjective fever/chills Eyes: No visual changes. ENT: No sore throat. Cardiovascular: Denies chest pain. Respiratory: Positive shortness of breath. Gastrointestinal: No abdominal pain. Mild nausea, no vomiting.  No diarrhea.  No constipation. Genitourinary: Negative for dysuria. Musculoskeletal: Negative for back pain. Skin: Positive left foot ulceration.  Neurological: Negative for headaches, focal weakness or numbness.  10-point ROS otherwise negative.  ____________________________________________   PHYSICAL EXAM:  VITAL SIGNS: ED Triage Vitals  Enc Vitals Group     BP 12/24/20 1513 116/62     Pulse Rate 12/24/20 1513 (!) 104     Resp 12/24/20 1513 20     Temp 12/24/20 1513 99.1 F (37.3 C)     Temp Source 12/24/20 1513 Oral     SpO2 12/24/20 1513 95 %     Weight 12/24/20 1515 222 lb (100.7 kg)     Height 12/24/20 1515 5\' 8"  (1.727 m)   Constitutional: Alert and oriented. Well appearing and in no acute distress. Eyes: Conjunctivae are normal. Head: Atraumatic. Nose: No congestion/rhinnorhea. Mouth/Throat: Mucous membranes are moist.  Neck: No stridor.  Cardiovascular: Tachycardia. Good peripheral circulation. Grossly normal heart sounds.   Respiratory: Normal respiratory effort.  No retractions. Lungs CTAB. Baseline 6L [place] O2.  Gastrointestinal: Soft and nontender. No distention.  Musculoskeletal: Edema and erythema in the left foot extending up into the left calf.  There is an ulcerating wound at the distal first  metatarsal as pictured below.  Neurologic:  Normal speech and language.  Chronic neuropathy in the bilateral lower extremities.  Skin:  Skin is warm and dry.  Cellulitis and ulceration to the distal first metatarsal.       ____________________________________________   LABS (all labs ordered are listed, but only abnormal results are displayed)  Labs Reviewed  LACTIC ACID, PLASMA - Abnormal; Notable for the following components:      Result Value   Lactic Acid, Venous 2.2 (*)    All other components within normal limits  COMPREHENSIVE METABOLIC PANEL -  Abnormal; Notable for the following components:   Sodium 130 (*)    Chloride 95 (*)    Glucose, Bld 238 (*)    BUN 24 (*)    Creatinine, Ser 1.81 (*)    AST 7 (*)    GFR, Estimated 41 (*)    All other components within normal limits  CBC WITH DIFFERENTIAL/PLATELET - Abnormal; Notable for the following components:   WBC 12.6 (*)    HCT 38.8 (*)    Neutro Abs 10.5 (*)    Abs Immature Granulocytes 0.12 (*)    All other components within normal limits  CBG MONITORING, ED - Abnormal; Notable for the following components:   Glucose-Capillary 284 (*)    All other components within normal limits  RESP PANEL BY RT-PCR (FLU A&B, COVID) ARPGX2  CULTURE, BLOOD (ROUTINE X 2)  CULTURE, BLOOD (ROUTINE X 2)  URINE CULTURE  PROTIME-INR  APTT  LACTIC ACID, PLASMA  URINALYSIS, ROUTINE W REFLEX MICROSCOPIC  CBG MONITORING, ED   ____________________________________________  EKG   EKG Interpretation  Date/Time:  Tuesday December 24 2020 15:37:23 EDT Ventricular Rate:  100 PR Interval:  217 QRS Duration: 94 QT Interval:  329 QTC Calculation: 425 R Axis:   158 Text Interpretation: Sinus tachycardia Premature atrial complexes Prolonged PR interval Nonspecific T wave abnormality Confirmed by Lajean Saver [phone]) on 12/25/2020 6:27:26 PM         ____________________________________________  RADIOLOGY  DG Chest Portable 1  View  Result Date: 12/24/2020 CLINICAL DATA:  Questionable sepsis, diabetes mellitus, COPD EXAM: PORTABLE CHEST 1 VIEW COMPARISON:  Portable exam 1600 hours compared to 04/12/2020 FINDINGS: Enlargement of cardiac silhouette. Mediastinal contours and pulmonary vascularity normal. Atherosclerotic calcification aorta. Parenchymal scarring at the mid to inferior LEFT chest. Bibasilar atelectasis. Lungs otherwise clear. No definite acute infiltrate, pleural effusion, or pneumothorax. Osseous structures unremarkable. IMPRESSION: Bibasilar atelectasis and chronic LEFT basilar scarring. Enlargement of cardiac silhouette. Aortic Atherosclerosis (ICD10-I70.0). Electronically Signed   By: Lavonia Dana M.D.   On: 12/24/2020 16:18   DG Foot Complete Left  Result Date: 12/24/2020 CLINICAL DATA:  Plantar ulceration great toe, diabetes mellitus, question osteomyelitis EXAM: LEFT FOOT - COMPLETE 3+ VIEW COMPARISON:  05/25/2005 FINDINGS: Osseous mineralization decreased. Joint spaces preserved. No acute fracture or dislocation. Ulcer identified at plantar aspect of LEFT foot at the level of the proximal phalanx. Small soft tissue calcification versus radiopaque foreign body at ulcer 1.2 mm diameter. No bone destruction to suggest osteomyelitis. IMPRESSION: No definite evidence of osteomyelitis. If this remains a clinical concern, consider MR assessment. Electronically Signed   By: Lavonia Dana M.D.   On: 12/24/2020 16:21    ____________________________________________   PROCEDURES  Procedure(s) performed:   Procedures  CRITICAL CARE Performed by: Margette Fast Total critical care time: 35 minutes Critical care time was exclusive of separately billable procedures and treating other patients. Critical care was necessary to treat or prevent imminent or life-threatening deterioration. Critical care was time spent personally by me on the following activities: development of treatment plan with patient and/or surrogate  as well as nursing, discussions with consultants, evaluation of patient's response to treatment, examination of patient, obtaining history from patient or surrogate, ordering and performing treatments and interventions, ordering and review of laboratory studies, ordering and review of radiographic studies, pulse oximetry and re-evaluation of patient's condition.  Nanda Quinton, MD Emergency Medicine  ____________________________________________   INITIAL IMPRESSION / ASSESSMENT AND PLAN / ED COURSE  Pertinent labs & imaging results that  were available during my care of the patient were reviewed by me and considered in my medical decision making (see chart for details).   Patient presents to the emergency department with worsening wound to the left foot.  He has some developing cellulitis in the foot and lower extremity on the left.  He is chronically ill at baseline on 6 L nasal cannula which is similar today.  No fever but does arrive with tachycardia.  No altered mental status.  Initiated septic order set with tachycardia and signs of infection.  Patient will require antibiotics and admission.  He is followed by [facility].   05:00 PM  Patient's lab work reviewed showing leukocytosis with mildly elevated lactic acid.  Mild hyponatremia with creatinine of 1.8 seeming near in line with patient's prior values.  No obvious osteomyelitis on the left foot x-ray.  Chest x-ray appears chronic.  Discussed the case with podiatry on-call for [facility], Dr. Posey Pronto.  He agrees with plan for MRI of the foot suspecting osteomyelitis.  He will consult on the patient and prefers them to be admitted to [facility] if possible.   Discussed patient's case with [facility] to request admission. Patient and family (if present) updated with plan. Care transferred to [facility] service.  I reviewed all nursing notes, vitals, pertinent old records, EKGs, labs, imaging (as  available).    ____________________________________________  FINAL CLINICAL IMPRESSION(S) / ED DIAGNOSES  Final diagnoses:  Ulcer of left foot, unspecified ulcer stage ([name])     MEDICATIONS GIVEN DURING THIS VISIT:  Medications  lactated ringers infusion (has no administration in time range)  sodium chloride 0.9 % bolus 500 mL (500 mLs Intravenous New Bag/Given 12/24/20 1626)  vancomycin (VANCOCIN) IVPB 1000 mg/200 mL premix (1,000 mg Intravenous New Bag/Given 12/24/20 1626)    Followed by  vancomycin (VANCOCIN) IVPB 1000 mg/200 mL premix (has no administration in time range)     Note:  This document was prepared using Dragon voice recognition software and may include unintentional dictation errors.  Nanda Quinton, MD, [facility] Emergency Medicine    Veverly Larimer, Wonda [facility], MD 12/26/20 [phone]

## 2020-12-24 NOTE — H&P (Signed)
History and Physical  [name] UKG:[id number] DOB: 1956-05-17 DOA: 12/24/2020  Referring physician: Accepted as a direct admission from [facility] to telemetry as inpatient status by Dr. Ella [name], [facility] PCP: Kevin Rainbow, PA-C (Inactive)  Outpatient Specialists: Podiatry. Patient coming from: Home.  Chief Complaint: Diabetic left foot ulcer.  HPI: [name] is a 65 y.o. male with medical history significant for type 2 diabetes, CKD 3B, essential hypertension, COPD on 6 L nasal cannula continuously at baseline followed by pulmonary, coronary artery disease, chronic left foot diabetic ulcer followed by podiatry, worsening for the past 1 month who presented to [facility] ED as instructed by [facility] walk-in clinic on the day of presentation.  He was seen there today due to self noted swelling erythema and burning sensation at the site of his diabetic foot ulcer.  After examination in the office he was referred to the emergency department for IV antibiotics and evaluation of possible osteomyelitis.  Not on antibiotics prior to admission.  Reports recovering from recent COVID-19 viral infection.  Tested positive for COVID test kit 2 weeks ago and was treated with Paxil twice daily x5 days by his primary care provider.  States he was having GI symptoms, diarrhea, which have now resolved.  In the ED he was started on IV vancomycin due to concern for cellulitis.  Blood cultures were obtained and are pending.  Patient was admitted as a direct admit by Dr. Ella [name] from [facility] ED to [facility] for further evaluation and management of present condition.  His COVID-19 screening test returned positive on 12/24/2020.  ED Course:  T-max 99.1.  BP 125/56, pulse 76, respiratory 20, O2 saturation 100% on 5 L.  Lab studies remarkable for serum sodium 130, serum bicarb 22, glucose 238, BUN 24, creatinine 1.81, GFR 41.  Lactic acid 2.2, 1.8.  WBC 12.6, hemoglobin 13.0, MCV 80, platelet 213, neutrophil count 10.5.  Review of  Systems: Review of systems as noted in the HPI. All other systems reviewed and are negative.   Past Medical History:  Diagnosis Date  . Anxiety   . Arthritis   . Cholecystitis, acute 12/20/2013   Lap chole 6/5  . Cocaine abuse ([name])   . COPD (chronic obstructive pulmonary disease) ([name])   . Coronary atherosclerosis of native coronary artery    a. 03/09/2013 Cath/PCI: LM nl, LAD: 50p, 84m (2.5x16 promus DES), LCX nl, OM1 min irregs, LPL/LPDA diff dzs, RCA nondom, mod diff dzs, EF 55%.  . Depression   . Essential hypertension   . GERD (gastroesophageal reflux disease)   . Headache   . Hepatitis Late 1970s  . History of nephrolithiasis   . History of pneumonia   . History of stroke    Right MCA distribution, residual left-sided weakness  . Hyperlipidemia   . Noncompliance   . Renal cell carcinoma [facility])    Status post radical right nephrectomy August 2015  . Sleep apnea    On CPAP, 4L O2 no cpap at home yet  . Type 2 diabetes mellitus ([name]) 2011   Past Surgical History:  Procedure Laterality Date  . APPENDECTOMY  1970's  . CHOLECYSTECTOMY N/A 12/22/2013   Procedure: LAPAROSCOPIC CHOLECYSTECTOMY ;  Surgeon: Kevin Rubenstein, MD;  Location: [facility] OR;  Service: General;  Laterality: N/A;  . ENDARTERECTOMY Right 10/08/2014   Procedure: Right ENDARTERECTOMY CAROTID;  Surgeon: Kevin Earthly, MD;  Location: [facility] OR;  Service: Vascular;  Laterality: Right;  . LAPAROSCOPIC LYSIS OF ADHESIONS  02/21/2014  Procedure: LAPAROSCOPIC LYSIS OF ADHESIONS EXTINSIVE;  Surgeon: Kevin Frock, MD;  Location: [facility] ORS;  Service: Urology;;  . LEFT HEART CATHETERIZATION WITH CORONARY ANGIOGRAM N/A 06/02/2012   Procedure: LEFT HEART CATHETERIZATION WITH CORONARY ANGIOGRAM;  Surgeon: Kevin Bow, MD;  Location: [facility] CATH LAB;  Service: Cardiovascular;  Laterality: N/A;  . LEFT HEART CATHETERIZATION WITH CORONARY ANGIOGRAM N/A 03/09/2013   Procedure: LEFT HEART CATHETERIZATION WITH CORONARY ANGIOGRAM;  Surgeon:  Kevin Mocha, MD;  Location: [facility] CATH LAB;  Service: Cardiovascular;  Laterality: N/A;  . LEFT HEART CATHETERIZATION WITH CORONARY ANGIOGRAM N/A 12/20/2013   Procedure: LEFT HEART CATHETERIZATION WITH CORONARY ANGIOGRAM;  Surgeon: Kevin Blanks, MD;  Location: [facility] CATH LAB;  Service: Cardiovascular;  Laterality: N/A;  . LUNG BIOPSY Left 05/18/2014   Procedure: LUNG BIOPSY left upper lobe & left lower lobe;  Surgeon: Kevin Nakayama, MD;  Location: [place];  Service: Thoracic;  Laterality: Left;  . PATCH ANGIOPLASTY Right 10/08/2014   Procedure: PATCH ANGIOPLASTY Right Carotid;  Surgeon: Kevin Posner, MD;  Location: [place];  Service: Vascular;  Laterality: Right;  . ROBOT ASSISTED LAPAROSCOPIC NEPHRECTOMY Right 02/21/2014   Procedure: ROBOTIC ASSISTED LAPAROSCOPIC RIGHT NEPHRECTOMY ;  Surgeon: Kevin Frock, MD;  Location: [facility] ORS;  Service: Urology;  Laterality: Right;  [name] VIDEO ASSISTED THORACOSCOPY Left 05/18/2014   Procedure: LEFT VIDEO ASSISTED THORACOSCOPY;  Surgeon: Kevin Nakayama, MD;  Location: [place];  Service: Thoracic;  Laterality: Left;  [name] VIDEO BRONCHOSCOPY  05/18/2014   Procedure: VIDEO BRONCHOSCOPY;  Surgeon: Kevin Nakayama, MD;  Location: [place];  Service: Thoracic;;    Social History:  reports that he has been smoking cigarettes. He started smoking about 48 years ago. He has a 41.00 pack-year smoking history. He has never used smokeless tobacco. He reports that he does not drink alcohol and does not use drugs.   Allergies  Allergen Reactions  . Lisinopril Other (See Comments)  . Oxycodone Itching and Nausea Only    Pt is able to tolerate if taken with benadryl    Family History  Problem Relation Age of Onset  . Cancer Mother        Thyroid - living in her 52's.  [name] Hypertension Other   . Diabetes Other   . Stroke Other   . Lung cancer Father   . CAD Father   . Cancer Maternal Grandmother        Breast  . Cancer Maternal Grandfather        Throat and  stomach  . CAD Brother       Prior to Admission medications   Medication Sig Start Date End Date Taking? Authorizing Provider  acetaminophen (TYLENOL) 325 MG tablet 2 tablets as needed 06/03/18   [provider]  ALPRAZolam (XANAX) 0.25 MG tablet Take 0.25 mg by mouth 3 (three) times daily as needed. 10/24/20   [provider]  ALPRAZolam Duanne Moron) 0.5 MG tablet Take 1 tablet (0.5 mg total) by mouth 3 (three) times daily as needed for anxiety. 06/03/18   Dhungel, Flonnie Overman, MD  atorvastatin (LIPITOR) 20 MG tablet Take 20 mg by mouth at bedtime. 04/26/18   [provider]  budesonide (PULMICORT) 1 MG/2ML nebulizer solution 4 ml 01/16/20   [provider]  budesonide-formoterol (SYMBICORT) 160-4.5 MCG/ACT inhaler Inhale 2 puffs into the lungs 2 (two) times daily.    [provider]  clopidogrel (PLAVIX) 75 MG tablet TAKE ONE TABLET BY MOUTH EVERY DAY 02/27/20   Domenic Polite,  Aloha Gell, MD  diltiazem (CARDIZEM CD) 180 MG 24 hr capsule Take 1 capsule (180 mg total) by mouth daily. 10/27/19   Satira Sark, MD  diphenhydrAMINE (BENADRYL) 25 mg capsule 1 capsule as needed    [provider]  docusate sodium (COLACE) 100 MG capsule Take 200 mg by mouth at bedtime.     [provider]  esomeprazole (NEXIUM) 40 MG capsule Take 40 mg by mouth daily at 12 noon. Pt only take as needed    [provider]  FLUoxetine (PROZAC) 40 MG capsule Take 40 mg by mouth daily. Take with 10 mg capsule = 50 mg total    [provider]  formoterol (PERFOROMIST) 20 MCG/2ML nebulizer solution 2 ml 01/16/20   [provider]  gabapentin (NEURONTIN) 300 MG capsule Take 900 mg by mouth 2 (two) times daily.     [provider]  glucose blood (TRUE METRIX BLOOD GLUCOSE TEST) test strip     [provider]  Glycopyrrolate-Formoterol (BEVESPI AEROSPHERE) 9-4.8 MCG/ACT AERO Inhale 2 puffs into the lungs 2 (two) times daily. 03/30/18    Brand Males, MD  insulin aspart (NOVOLOG FLEXPEN) 100 UNIT/ML FlexPen 30 units 5-10 min before meals    [provider]  insulin lispro (HUMALOG) 100 UNIT/ML cartridge Inject 80 Units into the skin 3 (three) times daily with meals.    [provider]  insulin regular human CONCENTRATED (HUMULIN R U-500 KWIKPEN) 500 UNIT/ML kwikpen Inject into the skin. 04/08/20   [provider]  isosorbide mononitrate (IMDUR) 60 MG 24 hr tablet TAKE ONE TABLET BY MOUTH DAILY 06/11/20   Satira Sark, MD  LANTUS 100 UNIT/ML injection SMARTSIG:115 Unit(s) SUB-Q Twice Daily 04/08/20   [provider]  linagliptin (TRADJENTA) 5 MG TABS tablet Take 5 mg by mouth daily.    [provider]  LORazepam (ATIVAN) 1 MG tablet Take 0.5 mg by mouth at bedtime.     [provider]  moxifloxacin (AVELOX) 400 MG tablet 1 tablet 02/22/20   [provider]  nitroGLYCERIN (NITROSTAT) 0.4 MG SL tablet PLACE ONE tablet (0.4 MG total) UNDER THE TONGUE every FIVE (five) minutes as needed FOR CHEST PAIN. IF 3 TABLETS ARE NECESSARY CALL 911] 11/15/20   Satira Sark, MD  ondansetron (ZOFRAN) 4 MG tablet Take 4 mg by mouth every 6 (six) hours as needed. 05/15/20   [provider]  ondansetron (ZOFRAN) 8 MG tablet 1 tablet    [provider]  oxybutynin (DITROPAN-XL) 10 MG 24 hr tablet Take 10 mg by mouth daily. 02/22/18   [provider]  oxyCODONE-acetaminophen (PERCOCET) 10-325 MG tablet Take 1 tablet by mouth every 6 (six) hours as needed for pain. 06/03/18   Dhungel, Nishant, MD  potassium chloride SA (K-DUR,KLOR-CON) 20 MEQ tablet Take 20 mEq by mouth 2 (two) times daily.     [provider]  pregabalin (LYRICA) 25 MG capsule Take 25 mg by mouth 2 (two) times daily. 02/14/19   [provider]  Probiotic Product (PROBIOTIC PO) Take 1 capsule by mouth daily.    [provider]  tamsulosin (FLOMAX) 0.4 MG CAPS  capsule Take 0.4 mg by mouth daily.     [provider]  torsemide (DEMADEX) 20 MG tablet TAKE THREE TABLETS BY MOUTH TWICE DAILY 12/23/20   Satira Sark, MD  zolpidem (AMBIEN) 5 MG tablet Take 5 mg by mouth at bedtime as needed. for insomnia 03/03/19   [provider]    Physical Exam: BP (!) 125/56 (BP Location: Right Arm) Comment: not. nurse  Pulse 76   Temp 98.2 F (36.8 C) (Oral)   Resp 20   Ht $R'5\' 8"'Gr$  (1.727 m)   Wt 100.7 kg   SpO2 100%   BMI 33.75 kg/m   . General: 66 y.o. year-old male well developed well nourished in no acute distress.  Alert and oriented x3. . Cardiovascular: Regular rate and rhythm with no rubs or gallops.  No thyromegaly or JVD noted.  No lower extremity edema.  [name] Respiratory: Diffuse rales, wheezing bilaterally.  Good inspiratory effort. . Abdomen: Soft nontender nondistended with normal bowel sounds x4 quadrants. . Muskuloskeletal: No cyanosis, clubbing or edema noted bilaterally . Neuro: CN II-XII intact, strength, sensation, reflexes . Skin: Erythema affecting left foot plantar region of first metatarsal. . Psychiatry: Judgement and insight appear normal. Mood is appropriate for condition and setting          Labs on Admission:  Basic Metabolic Panel: Recent Labs  Lab 12/24/20 1545  NA 130*  K 3.8  CL 95*  CO2 22  GLUCOSE 238*  BUN 24*  CREATININE 1.81*  CALCIUM 9.1   Liver Function Tests: Recent Labs  Lab 12/24/20 1545  AST 7*  ALT 8  ALKPHOS 81  BILITOT 0.7  PROT 7.7  ALBUMIN 3.8   No results for input(s): LIPASE, AMYLASE in the last 168 hours. No results for input(s): AMMONIA in the last 168 hours. CBC: Recent Labs  Lab 12/24/20 1545  WBC 12.6*  NEUTROABS 10.5*  HGB 13.0  HCT 38.8*  MCV 80.8  PLT 213   Cardiac Enzymes: No results for input(s): CKTOTAL, CKMB, CKMBINDEX, TROPONINI in the last 168 hours.  BNP (last 3 results) Recent Labs    04/12/20 1348  BNP 84.2    ProBNP (last 3  results) No results for input(s): PROBNP in the last 8760 hours.  CBG: Recent Labs  Lab 12/24/20 1518  GLUCAP 284*    Radiological Exams on Admission: DG Chest Portable 1 View  Result Date: 12/24/2020 CLINICAL DATA:  Questionable sepsis, diabetes mellitus, COPD EXAM: PORTABLE CHEST 1 VIEW COMPARISON:  Portable exam 1600 hours compared to 04/12/2020 FINDINGS: Enlargement of cardiac silhouette. Mediastinal contours and pulmonary vascularity normal. Atherosclerotic calcification aorta. Parenchymal scarring at the mid to inferior LEFT chest. Bibasilar atelectasis. Lungs otherwise clear. No definite acute infiltrate, pleural effusion, or pneumothorax. Osseous structures unremarkable. IMPRESSION: Bibasilar atelectasis and chronic LEFT basilar scarring. Enlargement of cardiac silhouette. Aortic Atherosclerosis (ICD10-I70.0). Electronically Signed   By: Lavonia Dana M.D.   On: 12/24/2020 16:18   DG Foot Complete Left  Result Date: 12/24/2020 CLINICAL DATA:  Plantar ulceration great toe, diabetes mellitus, question osteomyelitis EXAM: LEFT FOOT - COMPLETE 3+ VIEW COMPARISON:  05/25/2005 FINDINGS: Osseous mineralization decreased. Joint spaces preserved. No acute fracture or dislocation. Ulcer identified at plantar aspect of LEFT foot at the level of the proximal phalanx. Small soft tissue calcification versus radiopaque foreign body at ulcer 1.2 mm diameter. No bone destruction to suggest osteomyelitis. IMPRESSION: No definite evidence of osteomyelitis. If this remains a clinical concern, consider MR assessment. Electronically Signed   By: Lavonia Dana M.D.   On: 12/24/2020 16:21    EKG: I independently viewed the EKG done and my findings are as followed: Sinus tachycardia rate of 100.  Nonspecific ST-T changes.  QTc 425.  Assessment/Plan Present on Admission: . Diabetic foot ulcer ([place])  Active Problems:   Diabetic foot  ulcer ([place])  Sepsis secondary to diabetic left foot ulcer, POA. Presented with  leukocytosis 12.6, respiration rate 25, heart rate 104, evidence of left foot diabetic ulcer Has been followed by podiatry for the past 1 month. Not on antibiotics prior to admission. Osteomyelitis needs to be ruled out. Obtain sed rate and CRP. MRI left foot to rule out osteomyelitis. Continue Ancef Left dorsalis pedis difficult to palpate Obtain ABI left foot to assess arterial disease.  Positive COVID-19 screening test. No definite acute infiltrates on chest x-ray AT his baseline oxygen requirement. Patient had an initial diagnosis of COVID-19 infection 2 weeks prior to this presentation. Son reported he was treated with Paxil twice daily x5 days by his primary care provider and completed the course. We will hold off on remdesivir since he has been treated previously. We will start p.o. Decadron due to mild diffuse wheezing on exam Multivitamins, vitamin D3, zinc, vitamin C Incentive spirometer Mobilize as tolerated  CKD 3B He appears to be at his baseline creatinine 1.8 with GFR 41 Monitor urine output Avoid nephrotoxic agents Repeat BMP in the morning.  Type 2 diabetes with hyperglycemia Likely will be exacerbated by Decadron Obtain hemoglobin A1c. Scheduled long acting and short acting subcu insulin Insulin sliding scale.  Diabetic polyneuropathy Resume home regimen. Fall precautions.  Hypovolemic hyponatremia Serum sodium corrected for hyperglycemia 132 Continue gentle IV fluid hydration Repeat BMP in the morning.  Physical debility PT OT to assess once hemodynamically stable Fall precautions.  To none at the time of this dictation his home meds were not fully reconciled.    DVT prophylaxis: Subcu Lovenox daily.  Code Status: Full code.  Family Communication: None at bedside.  Disposition Plan: Admit to telemetry unit  Consults called: Podiatry.  Admission status: Inpatient status.  Patient will require at least 2 midnights for further evaluation and  treatment of present condition.   Status is: Inpatient    Dispo:  Patient From: Home  Planned Disposition: Home, possibly on 12/26/2020 or when podiatry signs ofF.  Medically stable for discharge: No         Kayleen Memos MD [facility] Pager [phone]  If 7PM-7AM, please contact night-coverage [email or web address] Password [facility]  12/24/2020, 9:34 PM

## 2020-12-24 NOTE — ED Notes (Signed)
attempted to give report  - RN told call back

## 2020-12-24 NOTE — ED Triage Notes (Signed)
Patient has ulceration to foot below left great toe,ongoing x 1 month

## 2020-12-24 NOTE — ED Notes (Signed)
MD notified of latic acid value

## 2020-12-24 NOTE — ED Notes (Signed)
Notified [name] at [place] patient is covid positive at 15

## 2020-12-24 NOTE — ED Notes (Signed)
Report given to [facility] 

## 2020-12-25 ENCOUNTER — Inpatient Hospital Stay (HOSPITAL_COMMUNITY): Payer: Medicare Other

## 2020-12-25 ENCOUNTER — Other Ambulatory Visit: Payer: Medicare Other | Admitting: Nurse Practitioner

## 2020-12-25 DIAGNOSIS — E118 Type 2 diabetes mellitus with unspecified complications: Secondary | ICD-10-CM

## 2020-12-25 DIAGNOSIS — I2510 Atherosclerotic heart disease of native coronary artery without angina pectoris: Secondary | ICD-10-CM

## 2020-12-25 DIAGNOSIS — L97529 Non-pressure chronic ulcer of other part of left foot with unspecified severity: Secondary | ICD-10-CM

## 2020-12-25 DIAGNOSIS — E1165 Type 2 diabetes mellitus with hyperglycemia: Secondary | ICD-10-CM

## 2020-12-25 DIAGNOSIS — E11621 Type 2 diabetes mellitus with foot ulcer: Secondary | ICD-10-CM

## 2020-12-25 DIAGNOSIS — N1832 Chronic kidney disease, stage 3b: Secondary | ICD-10-CM

## 2020-12-25 LAB — COMPREHENSIVE METABOLIC PANEL
ALT: 12 U/L (ref 0–44)
AST: 18 U/L (ref 15–41)
Albumin: 2.8 g/dL — ABNORMAL LOW (ref 3.5–5.0)
Alkaline Phosphatase: 76 U/L (ref 38–126)
Anion gap: 11 (ref 5–15)
BUN: 26 mg/dL — ABNORMAL HIGH (ref 8–23)
CO2: 21 mmol/L — ABNORMAL LOW (ref 22–32)
Calcium: 8.3 mg/dL — ABNORMAL LOW (ref 8.9–10.3)
Chloride: 100 mmol/L (ref 98–111)
Creatinine, Ser: 1.57 mg/dL — ABNORMAL HIGH (ref 0.61–1.24)
GFR, Estimated: 49 mL/min — ABNORMAL LOW (ref >60)
Glucose, Bld: 423 mg/dL — ABNORMAL HIGH (ref 70–99)
Potassium: 5.7 mmol/L — ABNORMAL HIGH (ref 3.5–5.1)
Sodium: 132 mmol/L — ABNORMAL LOW (ref 135–145)
Total Bilirubin: 1.7 mg/dL — ABNORMAL HIGH (ref 0.3–1.2)
Total Protein: 6.7 g/dL (ref 6.5–8.1)

## 2020-12-25 LAB — GLUCOSE, CAPILLARY
Glucose-Capillary: 477 mg/dL — ABNORMAL HIGH (ref 70–99)
Glucose-Capillary: 466 mg/dL — ABNORMAL HIGH (ref 70–99)
Glucose-Capillary: 311 mg/dL — ABNORMAL HIGH (ref 70–99)
Glucose-Capillary: 223 mg/dL — ABNORMAL HIGH (ref 70–99)

## 2020-12-25 LAB — CBC WITH DIFFERENTIAL/PLATELET
Abs Immature Granulocytes: 0.07 10*3/uL (ref 0.00–0.07)
Basophils Absolute: 0 10*3/uL (ref 0.0–0.1)
Basophils Relative: 0 %
Eosinophils Absolute: 0 10*3/uL (ref 0.0–0.5)
Eosinophils Relative: 0 %
HCT: 36.8 % — ABNORMAL LOW (ref 39.0–52.0)
Hemoglobin: 12.2 g/dL — ABNORMAL LOW (ref 13.0–17.0)
Immature Granulocytes: 1 %
Lymphocytes Relative: 7 %
Lymphs Abs: 0.6 10*3/uL — ABNORMAL LOW (ref 0.7–4.0)
MCH: 27.4 pg (ref 26.0–34.0)
MCHC: 33.2 g/dL (ref 30.0–36.0)
MCV: 82.5 fL (ref 80.0–100.0)
Monocytes Absolute: 0.2 10*3/uL (ref 0.1–1.0)
Monocytes Relative: 3 %
Neutro Abs: 8.2 10*3/uL — ABNORMAL HIGH (ref 1.7–7.7)
Neutrophils Relative %: 89 %
Platelets: 192 10*3/uL (ref 150–400)
RBC: 4.46 MIL/uL (ref 4.22–5.81)
RDW: 13.6 % (ref 11.5–15.5)
WBC: 9.2 10*3/uL (ref 4.0–10.5)
nRBC: 0 % (ref 0.0–0.2)

## 2020-12-25 LAB — SEDIMENTATION RATE: Sed Rate: 85 mm/hr — ABNORMAL HIGH (ref 0–16)

## 2020-12-25 LAB — MAGNESIUM: Magnesium: 1.3 mg/dL — ABNORMAL LOW (ref 1.7–2.4)

## 2020-12-25 LAB — C-REACTIVE PROTEIN: CRP: 15.6 mg/dL — ABNORMAL HIGH (ref <1.0)

## 2020-12-25 LAB — PHOSPHORUS: Phosphorus: 2.3 mg/dL — ABNORMAL LOW (ref 2.5–4.6)

## 2020-12-25 LAB — D-DIMER, QUANTITATIVE: D-Dimer, Quant: 0.93 ug/mL-FEU — ABNORMAL HIGH (ref 0.00–0.50)

## 2020-12-25 LAB — FERRITIN: Ferritin: 288 ng/mL (ref 24–336)

## 2020-12-25 LAB — HIV ANTIBODY (ROUTINE TESTING W REFLEX): HIV Screen 4th Generation wRfx: NONREACTIVE

## 2020-12-25 MED ORDER — ISOSORBIDE MONONITRATE ER 60 MG PO TB24
60.0000 mg | ORAL_TABLET | Freq: Every day | ORAL | Status: DC
  Administered 2020-12-25 – 2020-12-28 (×4): 60 mg via ORAL
  Filled 2020-12-25 (×4): qty 1

## 2020-12-25 MED ORDER — OXYCODONE-ACETAMINOPHEN 5-325 MG PO TABS
1.0000 | ORAL_TABLET | Freq: Once | ORAL | Status: AC | PRN
  Administered 2020-12-26: 1 via ORAL
  Filled 2020-12-25: qty 1

## 2020-12-25 MED ORDER — MAGNESIUM SULFATE 2 GM/50ML IV SOLN
2.0000 g | Freq: Once | INTRAVENOUS | Status: AC
  Administered 2020-12-25: 2 g via INTRAVENOUS
  Filled 2020-12-25: qty 50

## 2020-12-25 MED ORDER — INSULIN REGULAR HUMAN (CONC) 500 UNIT/ML ~~LOC~~ SOPN
60.0000 [IU] | PEN_INJECTOR | Freq: Three times a day (TID) | SUBCUTANEOUS | Status: DC
  Administered 2020-12-25 – 2020-12-26 (×4): 60 [IU] via SUBCUTANEOUS
  Filled 2020-12-25: qty 3

## 2020-12-25 MED ORDER — INSULIN ASPART 100 UNIT/ML IJ SOLN
0.0000 [IU] | Freq: Three times a day (TID) | INTRAMUSCULAR | Status: DC
  Administered 2020-12-25: 15 [IU] via SUBCUTANEOUS
  Administered 2020-12-25: 20 [IU] via SUBCUTANEOUS
  Administered 2020-12-26: 15 [IU] via SUBCUTANEOUS
  Administered 2020-12-26: 11 [IU] via SUBCUTANEOUS
  Administered 2020-12-26: 20 [IU] via SUBCUTANEOUS

## 2020-12-25 MED ORDER — SODIUM ZIRCONIUM CYCLOSILICATE 10 G PO PACK
10.0000 g | PACK | Freq: Two times a day (BID) | ORAL | Status: AC
  Administered 2020-12-25: 10 g via ORAL
  Filled 2020-12-25: qty 1

## 2020-12-25 MED ORDER — INSULIN ASPART 100 UNIT/ML IJ SOLN
30.0000 [IU] | Freq: Once | INTRAMUSCULAR | Status: AC
  Administered 2020-12-25: 30 [IU] via SUBCUTANEOUS

## 2020-12-25 MED ORDER — SODIUM ZIRCONIUM CYCLOSILICATE 10 G PO PACK
10.0000 g | PACK | Freq: Once | ORAL | Status: AC
  Administered 2020-12-25: 10 g via ORAL
  Filled 2020-12-25: qty 1

## 2020-12-25 MED ORDER — DIPHENHYDRAMINE HCL 25 MG PO CAPS: 25.0000 mg | ORAL_CAPSULE | Freq: Once | ORAL | Status: DC | PRN

## 2020-12-25 NOTE — Progress Notes (Signed)
PROGRESS NOTE                                                                                                                                                                                                             Patient Demographics:    [name], is a 65 y.o. male, DOB - 10/22/1955, IHK:[id number]  Outpatient Primary MD for the patient is Garth Bigness (Inactive)   Admit date - 12/24/2020   LOS - 1  Chief Complaint  Patient presents with  . diabetic foot ulcer       Brief Narrative: Patient is a 65 y.o. male with PMHx of insulin-dependent DM-2, CKD stage IIIb, HTN, COPD with chronic hypoxic respiratory failure on 6 L of home O2-presented with left diabetic foot.  See below for further details.  COVID-19 vaccinated status: Vaccinated  Significant Events: 5/28>> COVID 19 positive 6/7>> Admit to [facility], [place] for left diabetic foot  Significant studies: 6/7>> x-ray left foot: No definite evidence of osteomyelitis. 6/7>> chest x-ray: No pneumonia.  COVID-19 medications: None  Antibiotics: Ancef: 6/7>>  Microbiology data: 6/7 >>blood culture: No growth 6/7>> urine culture: Pending  Procedures: None  Consults: Podiatry  DVT prophylaxis: enoxaparin (LOVENOX) injection 40 mg Start: 12/25/20 1000    Subjective:    [name] today appears comfortable-on 6 L (at baseline).  Left foot unchanged overnight.   Assessment  & Plan :   Left diabetic foot: Continue empiric Ancef-cultures negative so far-await MRI-podiatry following.  Doubt sepsis present on admission-sepsis ruled out.  COVID-19 infection: Per patient-diagnosed around 5/28-completed treatment with Paxlovid-currently feels that he is back to his baseline-has chronic cough and is on 6 L of oxygen chronically.  Suspect requires 10 days of isolation.  Insulin-dependent DM-2 with uncontrolled hyperglycemia: Likely due to Decadron-started  on admission-per patient-he is on Humulin R 500-takes 80 units 3 times a day-we will go ahead and resume it at 60 units 3 times daily-I have stopped Decadron.  Stop Lantus.  Recent Labs    12/24/20 1518 12/24/20 2259 12/25/20 0811  GLUCAP 284* 275* 477*   CKD stage IIIb: Creatinine close to baseline-follow.  Hyperkalemia: Mild-we will treat with Lokelma-reassess tomorrow.  Diabetic polyneuropathy: At baseline  History of CAD-s/p PCI in 2015: No anginal symptoms-continue Plavix, statin, Imdur  COPD with chronic hypoxic respiratory failure: On 6 L  of oxygen at baseline.  Stable-no wheezing-stop Decadron.  Continue bronchodilators  BPH: Continue Flomax  GERD: Continue PPI  Obesity: Estimated body mass index is 33.72 kg/m as calculated from the following:   Height as of this encounter: 5\' 8"  (1.727 m).   Weight as of this encounter: 100.6 kg.    ABG:    Component Value Date/Time   PHART 7.410 06/12/2019 1350   PCO2ART 45.0 06/12/2019 1350   PO2ART 93.1 06/12/2019 1350   HCO3 28.0 06/12/2019 1350   TCO2 25 11/07/2018 1717   ACIDBASEDEF 2.0 11/07/2018 1717   O2SAT 97.6 06/12/2019 1350    Vent Settings: N/A    Condition - Stable  Family Communication  : None at bedside-we will reach out to family over the next few days.  Code Status :  Full Code  Diet :  Diet Order            Diet NPO time specified Except for: Sips with Meds  Diet effective midnight                  Disposition Plan  :   Status is: Inpatient  Remains inpatient appropriate because:Inpatient level of care appropriate due to severity of illness   Dispo:  Patient From: Home  Planned Disposition: Home  Medically stable for discharge: No     Barriers to discharge: Left diabetic foot-on IV antibiotics-awaiting MRI to determine if patient have osteomyelitis-May require amputation.  Antimicorbials  :    Anti-infectives (From admission, onward)   Start     Dose/Rate Route Frequency  Ordered Stop   12/26/20 1000  remdesivir 100 mg in sodium chloride 0.9 % 100 mL IVPB  Status:  Discontinued       "Followed by" Linked Group Details   100 mg 200 mL/hr over 30 Minutes Intravenous Daily 12/24/20 2157 12/24/20 2238   12/25/20 0000  remdesivir 200 mg in sodium chloride 0.9% 250 mL IVPB  Status:  Discontinued       "Followed by" Linked Group Details   200 mg 580 mL/hr over 30 Minutes Intravenous Once 12/24/20 2157 12/24/20 2238   12/24/20 2230  ceFAZolin (ANCEF) IVPB 2g/100 mL premix        2 g 200 mL/hr over 30 Minutes Intravenous Every 8 hours 12/24/20 2131     12/24/20 1715  vancomycin (VANCOCIN) IVPB 1000 mg/200 mL premix       "Followed by" Linked Group Details   1,000 mg 200 mL/hr over 60 Minutes Intravenous  Once 12/24/20 1545 12/24/20 1920   12/24/20 1545  vancomycin (VANCOCIN) IVPB 1000 mg/200 mL premix  Status:  Discontinued        1,000 mg 200 mL/hr over 60 Minutes Intravenous  Once 12/24/20 1537 12/24/20 1545   12/24/20 1545  vancomycin (VANCOCIN) IVPB 1000 mg/200 mL premix       "Followed by" Linked Group Details   1,000 mg 200 mL/hr over 60 Minutes Intravenous  Once 12/24/20 1545 12/24/20 1735      Inpatient Medications  Scheduled Meds: . vitamin C  500 mg Oral Daily  . atorvastatin  20 mg Oral QHS  . cholecalciferol  1,000 Units Oral Daily  . clopidogrel  75 mg Oral Daily  . enoxaparin (LOVENOX) injection  40 mg Subcutaneous Q24H  . folic acid  1 mg Oral Daily  . gabapentin  300 mg Oral BID  . insulin aspart  0-20 Units Subcutaneous TID WC  . insulin aspart  0-5 Units Subcutaneous  QHS  . insulin regular human CONCENTRATED  60 Units Subcutaneous TID WC  . mometasone-formoterol  2 puff Inhalation BID  . multivitamin with minerals  1 tablet Oral Daily  . pantoprazole  40 mg Oral Daily  . tamsulosin  0.4 mg Oral Daily  . thiamine  100 mg Oral Daily  . zinc sulfate  220 mg Oral Daily   Continuous Infusions: .  ceFAZolin (ANCEF) IV 2 g (12/25/20  0522)  . lactated ringers 150 mL/hr at 12/24/20 2316   PRN Meds:.ALPRAZolam, guaiFENesin-dextromethorphan, zolpidem   Time Spent in minutes  35   See all Orders from today for further details   Oren Binet M.D on 12/25/2020 at 11:19 AM  To page go to [email or web address] - use universal password  [facility] -  Office  [phone]    Objective:   Vitals:   12/24/20 1921 12/24/20 2118 12/25/20 0504 12/25/20 0522  BP:  (!) 125/56 (!) 138/57   Pulse:  76 73   Resp:  20    Temp:  98.2 F (36.8 C) 98.1 F (36.7 C)   TempSrc:  Oral Axillary   SpO2: 98% 100% 94%   Weight:    100.6 kg  Height:        Wt Readings from Last 3 Encounters:  12/25/20 100.6 kg  06/10/20 103.4 kg  04/18/20 105.4 kg     Intake/Output Summary (Last 24 hours) at 12/25/2020 1119 Last data filed at 12/25/2020 0349 Gross per 24 hour  Intake 1500 ml  Output 1675 ml  Net -175 ml     Physical Exam Gen Exam:Alert awake-not in any distress HEENT:atraumatic, normocephalic Chest: B/L clear to auscultation anteriorly CVS:S1S2 regular Abdomen:soft non tender, non distended Extremities:no edema Neurology: Non focal Skin: no rash   Data Review:    CBC Recent Labs  Lab 12/24/20 1545 12/25/20 0408  WBC 12.6* 9.2  HGB 13.0 12.2*  HCT 38.8* 36.8*  PLT 213 192  MCV 80.8 82.5  MCH 27.1 27.4  MCHC 33.5 33.2  RDW 13.8 13.6  LYMPHSABS 1.1 0.6*  MONOABS 0.8 0.2  EOSABS 0.0 0.0  BASOSABS 0.0 0.0    Chemistries  Recent Labs  Lab 12/24/20 1545 12/25/20 0408  NA 130* 132*  K 3.8 5.7*  CL 95* 100  CO2 22 21*  GLUCOSE 238* 423*  BUN 24* 26*  CREATININE 1.81* 1.57*  CALCIUM 9.1 8.3*  MG  --  1.3*  AST 7* 18  ALT 8 12  ALKPHOS 81 76  BILITOT 0.7 1.7*   ------------------------------------------------------------------------------------------------------------------ No results for input(s): CHOL, HDL, LDLCALC, TRIG, CHOLHDL, LDLDIRECT in the last 72 hours.  Lab Results  Component  Value Date   HGBA1C 13.4 (H) 05/19/2019   ------------------------------------------------------------------------------------------------------------------ No results for input(s): TSH, T4TOTAL, T3FREE, THYROIDAB in the last 72 hours.  Invalid input(s): FREET3 ------------------------------------------------------------------------------------------------------------------ Recent Labs    12/25/20 0408  FERRITIN 288    Coagulation profile Recent Labs  Lab 12/24/20 1545  INR 1.1    Recent Labs    12/25/20 0408  DDIMER 0.93*    Cardiac Enzymes No results for input(s): CKMB, TROPONINI, MYOGLOBIN in the last 168 hours.  Invalid input(s): CK ------------------------------------------------------------------------------------------------------------------    Component Value Date/Time   BNP 84.2 04/12/2020 1348    Micro Results Recent Results (from the past 240 hour(s))  Blood Culture (routine x 2)     Status: None (Preliminary result)   Collection Time: 12/24/20  3:50 PM   Specimen: BLOOD  Result Value Ref  Range Status   Specimen Description BLOOD LEFT ANTECUBITAL  Final   Special Requests   Final    BOTTLES DRAWN AEROBIC AND ANAEROBIC Blood Culture adequate volume   Culture   Final    NO GROWTH < 12 HOURS Performed at [facility], [address], [place], [place] [address]    Report Status PENDING  Incomplete  Resp Panel by RT-PCR (Flu A&B, Covid)     Status: Abnormal   Collection Time: 12/24/20  4:00 PM   Specimen: Nasopharyngeal(NP) swabs in vial transport medium  Result Value Ref Range Status   SARS Coronavirus 2 by RT PCR POSITIVE (A) NEGATIVE Final    Comment: RESULT CALLED TO, READ BACK BY AND VERIFIED WITH: Radene Ou B RN [id number] [place] 12/24/20 (NOTE) SARS-CoV-2 target nucleic acids are DETECTED.  The SARS-CoV-2 RNA is generally detectable in upper respiratory specimens during the acute phase of infection. Positive results are indicative of the presence of  the identified virus, but do not rule out bacterial infection or co-infection with other pathogens not detected by the test. Clinical correlation with patient history and other diagnostic information is necessary to determine patient infection status. The expected result is Negative.  Fact Sheet for Patients: EntrepreneurPulse.com.au  Fact Sheet for Healthcare Providers: IncredibleEmployment.be  This test is not yet approved or cleared by the [place] FDA and  has been authorized for detection and/or diagnosis of SARS-CoV-2 by FDA under an Emergency Use Authorization (EUA).  This EUA will remain in effect (meaning this test can be used)  for the duration of  the COVID-19 declaration under Section 564(b)(1) of the Act, 21 U.S.C. section 360bbb-3(b)(1), unless the authorization is terminated or revoked sooner.     Influenza A by PCR NEGATIVE NEGATIVE Final   Influenza B by PCR NEGATIVE NEGATIVE Final    Comment: (NOTE) The Xpert Xpress SARS-CoV-2/FLU/RSV plus assay is intended as an aid in the diagnosis of influenza from Nasopharyngeal swab specimens and should not be used as a sole basis for treatment. Nasal washings and aspirates are unacceptable for Xpert Xpress SARS-CoV-2/FLU/RSV testing.  Fact Sheet for Patients: EntrepreneurPulse.com.au  Fact Sheet for Healthcare Providers: IncredibleEmployment.be  This test is not yet approved or cleared by the [place] FDA and has been authorized for detection and/or diagnosis of SARS-CoV-2 by FDA under an Emergency Use Authorization (EUA). This EUA will remain in effect (meaning this test can be used) for the duration of the COVID-19 declaration under Section 564(b)(1) of the Act, 21 U.S.C. section 360bbb-3(b)(1), unless the authorization is terminated or revoked.  Performed at [facility], [address], [place], [place]  [address]     Radiology Reports DG Chest Portable 1 View  Result Date: 12/24/2020 CLINICAL DATA:  Questionable sepsis, diabetes mellitus, COPD EXAM: PORTABLE CHEST 1 VIEW COMPARISON:  Portable exam 1600 hours compared to 04/12/2020 FINDINGS: Enlargement of cardiac silhouette. Mediastinal contours and pulmonary vascularity normal. Atherosclerotic calcification aorta. Parenchymal scarring at the mid to inferior LEFT chest. Bibasilar atelectasis. Lungs otherwise clear. No definite acute infiltrate, pleural effusion, or pneumothorax. Osseous structures unremarkable. IMPRESSION: Bibasilar atelectasis and chronic LEFT basilar scarring. Enlargement of cardiac silhouette. Aortic Atherosclerosis (ICD10-I70.0). Electronically Signed   By: Lavonia Dana M.D.   On: 12/24/2020 16:18   DG Foot Complete Left  Result Date: 12/24/2020 CLINICAL DATA:  Plantar ulceration great toe, diabetes mellitus, question osteomyelitis EXAM: LEFT FOOT - COMPLETE 3+ VIEW COMPARISON:  05/25/2005 FINDINGS: Osseous mineralization decreased. Joint spaces preserved. No acute  fracture or dislocation. Ulcer identified at plantar aspect of LEFT foot at the level of the proximal phalanx. Small soft tissue calcification versus radiopaque foreign body at ulcer 1.2 mm diameter. No bone destruction to suggest osteomyelitis. IMPRESSION: No definite evidence of osteomyelitis. If this remains a clinical concern, consider MR assessment. Electronically Signed   By: Lavonia Dana M.D.   On: 12/24/2020 16:21   ECHOCARDIOGRAM COMPLETE  Result Date: 12/06/2020    ECHOCARDIOGRAM REPORT   Patient Name:   CAEDIN MOGAN [facility] Date of Exam: 12/06/2020 Medical Rec #:  [id number]      Height:       66.0 in Accession #:    [id number]     Weight:       228.0 lb Date of Birth:  06/15/1956     BSA:          2.114 m Patient Age:    32 years       BP:           87/52 mmHg Patient Gender: M              HR:           91 bpm. Exam Location:  Outpatient Procedure: 2D Echo, Cardiac  Doppler and Color Doppler Indications:    I27.20 Pulmonary Hypertension  History:        Patient has prior history of Echocardiogram examinations, most                 recent 07/05/2015. CAD, Stroke and COPD; Risk                 Factors:Hypertension, Diabetes, Dyslipidemia, Former Smoker and                 Sleep Apnea. Cocaine abuse. GERD.  Sonographer:    Tiffany Dance Referring Phys: [id number] [place]  1. Left ventricular ejection fraction, by estimation, is 55 to 60%. The left ventricle has normal function. The left ventricle has no regional wall motion abnormalities. There is mild left ventricular hypertrophy. Left ventricular diastolic parameters are consistent with Grade I diastolic dysfunction (impaired relaxation).  2. Right ventricular systolic function is normal. The right ventricular size is normal. Tricuspid regurgitation signal is inadequate for assessing PA pressure.  3. Left atrial size was mildly dilated.  4. The mitral valve is grossly normal. Trivial mitral valve regurgitation.  5. The aortic valve is tricuspid. Aortic valve regurgitation is not visualized.  6. Mildly dilated pulmonary artery.  7. The inferior vena cava is dilated in size with >50% respiratory variability, suggesting right atrial pressure of 8 mmHg. Comparison(s): Prior images unable to be directly viewed, comparison made by report only. Changes from prior study are noted. 07/05/2015: LVEF 60-65%. FINDINGS  Left Ventricle: Left ventricular ejection fraction, by estimation, is 55 to 60%. The left ventricle has normal function. The left ventricle has no regional wall motion abnormalities. The left ventricular internal cavity size was normal in size. There is  mild left ventricular hypertrophy. Left ventricular diastolic parameters are consistent with Grade I diastolic dysfunction (impaired relaxation). Indeterminate filling pressures. Right Ventricle: The right ventricular size is normal. No increase in right  ventricular wall thickness. Right ventricular systolic function is normal. Tricuspid regurgitation signal is inadequate for assessing PA pressure. Left Atrium: Left atrial size was mildly dilated. Right Atrium: Right atrial size was normal in size. Pericardium: There is no evidence of pericardial effusion. Mitral Valve: The mitral valve is grossly normal.  Trivial mitral valve regurgitation. Tricuspid Valve: The tricuspid valve is grossly normal. Tricuspid valve regurgitation is trivial. Aortic Valve: The aortic valve is tricuspid. Aortic valve regurgitation is not visualized. Pulmonic Valve: The pulmonic valve was grossly normal. Pulmonic valve regurgitation is trivial. Aorta: The aortic root and ascending aorta are structurally normal, with no evidence of dilitation. Pulmonary Artery: The pulmonary artery is mildly dilated. Venous: The inferior vena cava is dilated in size with greater than 50% respiratory variability, suggesting right atrial pressure of 8 mmHg. IAS/Shunts: No atrial level shunt detected by color flow Doppler.  LEFT VENTRICLE PLAX 2D LVIDd:         4.90 cm LVIDs:         3.40 cm LV PW:         1.50 cm LV IVS:        1.10 cm LVOT diam:     2.10 cm LV SV:         73 LV SV Index:   35 LVOT Area:     3.46 cm  RIGHT VENTRICLE             IVC RV Basal diam:  3.00 cm     IVC diam: 2.50 cm RV Mid diam:    2.20 cm RV S prime:     11.40 cm/s TAPSE (M-mode): 1.6 cm LEFT ATRIUM           Index       RIGHT ATRIUM           Index LA diam:      3.90 cm 1.84 cm/m  RA Area:     11.40 cm LA Vol (A2C): 38.5 ml 18.21 ml/m RA Volume:   23.70 ml  11.21 ml/m LA Vol (A4C): 67.4 ml 31.88 ml/m  AORTIC VALVE LVOT Vmax:   115.67 cm/s LVOT Vmean:  78.900 cm/s LVOT VTI:    0.211 m  AORTA Ao Root diam: 3.30 cm Ao Asc diam:  3.40 cm MITRAL VALVE MV Area (PHT): 2.73 cm    SHUNTS MV Decel Time: 278 msec    Systemic VTI:  0.21 m MV E velocity: 56.30 cm/s  Systemic Diam: 2.10 cm MV A velocity: 80.40 cm/s MV E/A ratio:  0.70  Lyman Bishop MD Electronically signed by Lyman Bishop MD Signature Date/Time: 12/06/2020/3:49:44 PM    Final

## 2020-12-25 NOTE — Progress Notes (Signed)
Inpatient Diabetes Program Recommendations  AACE/ADA: New Consensus Statement on Inpatient Glycemic Control (2015)  Target Ranges:  Prepandial:   less than 140 mg/dL      Peak postprandial:   less than 180 mg/dL (1-2 hours)      Critically ill patients:  140 - 180 mg/dL   Lab Results  Component Value Date   GLUCAP 466 (H) 12/25/2020   HGBA1C 13.4 (H) 05/19/2019     Diabetes history:  DM2 Outpatient Diabetes medications:  U-500 90 units TID, Novolog 30 units TID, Tradjenta 5 mg QD Current orders for Inpatient glycemic control:  U-500 60 units TID, Novolog 0-20 units TID & 0-5 units QHS,   Inpatient Diabetes Program Recommendations:    Please consider, Novolog 0-20 units QHS.  Patient is currently NPO.  Spoke with him over the phone to verify above home medications.  Once eating please add Novolog TID meal coverage.  A1C is currently pending.  Will continue to follow while inpatient.  Thank you, Reche Dixon, RN, BSN Diabetes Coordinator Inpatient Diabetes Program [phone] (team pager from 8a-5p)

## 2020-12-25 NOTE — Consult Note (Signed)
  Subjective:  Patient ID: [name], male    DOB: 05-10-1956,  MRN: [id number]  A 65 y.o. male presents with medical history significant for type 2 diabetes, CKD 3B, essential hypertension, COPD on 6 L nasal cannula continuously at baseline followed by pulmonary, coronary artery disease presents with left hallux ulceration concerning for osteomyelitis with some redness circumferentially.  Patient states that this has been developing for quite some time he never realized it because he is neuropathic on the plantar aspect of his foot.  He is a diabetic with A1c still pending.  He has not been taking care of the wound.  He is known to Dr. Earleen Newport and was sent to the emergency room for concerning for osteomyelitis. Objective:   Vitals:   12/24/20 2118 12/25/20 0504  BP: (!) 125/56 (!) 138/57  Pulse: 76 73  Resp: 20   Temp: 98.2 F (36.8 C) 98.1 F (36.7 C)  SpO2: 100% 94%   General AA&O x3. Normal mood and affect.  Vascular Dorsalis pedis and posterior tibial pulses non-palpable Diminished capillary refill to all digits. Pedal hair not present.  Neurologic Epicritic sensation grossly intact.  Dermatologic  left hallux wound probing down to deep tissue with concern for osteomyelitis.  Malodor present.  No purulent drainage expressed.  Redness noticed on perfusion around the toe up to the level of the metatarsophalangeal joint.  Fiber granular wound base noted.  Orthopedic: MMT 5/5 in dorsiflexion, plantarflexion, inversion, and eversion. Normal joint ROM without pain or crepitus.    No definite evidence of osteomyelitis.  If this remains a clinical concern, consider MR assessment.  Assessment & Plan:  Patient was evaluated and treated and all questions answered.  Left hallux wound probing down to deep tissue/bone with concern for osteomyelitis -All questions concerns were addressed. -Radiographically there is no concern for osteomyelitis however there is a foreign body that is  present that could have led to the introduction was bacteria and therefore the ulceration. -Awaiting MRI for further evaluation of the bone.  If there is osteomyelitis present, patient will benefit from amputation of the great toe versus partial hallux amputation. -He will also benefit from ABIs PVRs to assess the vascular flow to the left lower extremity. -Local wound care with Betadine wet-to-dry dressing changes once a day -Partial weightbearing to the heel with Darco wedge shoe   Felipa Furnace, DPM  Accessible via secure chat for questions or concerns.

## 2020-12-26 ENCOUNTER — Encounter (HOSPITAL_COMMUNITY): Payer: Self-pay | Admitting: Internal Medicine

## 2020-12-26 ENCOUNTER — Inpatient Hospital Stay (HOSPITAL_COMMUNITY): Payer: Medicare Other

## 2020-12-26 DIAGNOSIS — I70213 Atherosclerosis of native arteries of extremities with intermittent claudication, bilateral legs: Secondary | ICD-10-CM

## 2020-12-26 LAB — CBC WITH DIFFERENTIAL/PLATELET
Abs Immature Granulocytes: 0.09 10*3/uL — ABNORMAL HIGH (ref 0.00–0.07)
Basophils Absolute: 0 10*3/uL (ref 0.0–0.1)
Basophils Relative: 0 %
Eosinophils Absolute: 0 10*3/uL (ref 0.0–0.5)
Eosinophils Relative: 0 %
HCT: 33.3 % — ABNORMAL LOW (ref 39.0–52.0)
Hemoglobin: 11.3 g/dL — ABNORMAL LOW (ref 13.0–17.0)
Immature Granulocytes: 1 %
Lymphocytes Relative: 8 %
Lymphs Abs: 0.8 10*3/uL (ref 0.7–4.0)
MCH: 27.3 pg (ref 26.0–34.0)
MCHC: 33.9 g/dL (ref 30.0–36.0)
MCV: 80.4 fL (ref 80.0–100.0)
Monocytes Absolute: 0.6 10*3/uL (ref 0.1–1.0)
Monocytes Relative: 6 %
Neutro Abs: 8.7 10*3/uL — ABNORMAL HIGH (ref 1.7–7.7)
Neutrophils Relative %: 85 %
Platelets: 209 10*3/uL (ref 150–400)
RBC: 4.14 MIL/uL — ABNORMAL LOW (ref 4.22–5.81)
RDW: 13.2 % (ref 11.5–15.5)
WBC: 10.2 10*3/uL (ref 4.0–10.5)
nRBC: 0 % (ref 0.0–0.2)

## 2020-12-26 LAB — HEMOGLOBIN A1C
Hgb A1c MFr Bld: 11.7 % — ABNORMAL HIGH (ref 4.8–5.6)
Mean Plasma Glucose: 289 mg/dL

## 2020-12-26 LAB — COMPREHENSIVE METABOLIC PANEL
ALT: 10 U/L (ref 0–44)
AST: 11 U/L — ABNORMAL LOW (ref 15–41)
Albumin: 2.5 g/dL — ABNORMAL LOW (ref 3.5–5.0)
Alkaline Phosphatase: 58 U/L (ref 38–126)
Anion gap: 10 (ref 5–15)
BUN: 20 mg/dL (ref 8–23)
CO2: 24 mmol/L (ref 22–32)
Calcium: 8.6 mg/dL — ABNORMAL LOW (ref 8.9–10.3)
Chloride: 101 mmol/L (ref 98–111)
Creatinine, Ser: 1.36 mg/dL — ABNORMAL HIGH (ref 0.61–1.24)
GFR, Estimated: 58 mL/min — ABNORMAL LOW (ref >60)
Glucose, Bld: 202 mg/dL — ABNORMAL HIGH (ref 70–99)
Potassium: 3.6 mmol/L (ref 3.5–5.1)
Sodium: 135 mmol/L (ref 135–145)
Total Bilirubin: 0.3 mg/dL (ref 0.3–1.2)
Total Protein: 6.2 g/dL — ABNORMAL LOW (ref 6.5–8.1)

## 2020-12-26 LAB — URINE CULTURE: Culture: <10000 — AB

## 2020-12-26 LAB — GLUCOSE, CAPILLARY
Glucose-Capillary: 301 mg/dL — ABNORMAL HIGH (ref 70–99)
Glucose-Capillary: 367 mg/dL — ABNORMAL HIGH (ref 70–99)
Glucose-Capillary: 297 mg/dL — ABNORMAL HIGH (ref 70–99)
Glucose-Capillary: 439 mg/dL — ABNORMAL HIGH (ref 70–99)

## 2020-12-26 MED ORDER — INSULIN REGULAR HUMAN (CONC) 500 UNIT/ML ~~LOC~~ SOPN
70.0000 [IU] | PEN_INJECTOR | Freq: Three times a day (TID) | SUBCUTANEOUS | Status: DC
  Administered 2020-12-26 – 2020-12-27 (×2): 70 [IU] via SUBCUTANEOUS

## 2020-12-26 NOTE — Anesthesia Preprocedure Evaluation (Addendum)
Anesthesia Evaluation    Reviewed: Allergy & Precautions, Patient's Chart, lab work & pertinent test results  Airway Mallampati: III  TM Distance: >3 FB Neck ROM: Full    Dental  (+) Edentulous Upper, Edentulous Lower   Pulmonary sleep apnea, Continuous Positive Airway Pressure Ventilation and Oxygen sleep apnea , COPD (6LPM 24/7 via [place]),  oxygen dependent, Current Smoker,  41 pack year history  chronic hypoxic respiratory failure on 6 L of home O2 COVID positive 12/14/20 5cigg/d   Pulmonary exam normal breath sounds clear to auscultation       Cardiovascular hypertension, Pt. on medications + CAD (PCI in 2015) and +CHF (grade 1 diastolic dysfunction)  Normal cardiovascular exam Rhythm:Regular Rate:Normal  Last echo 11/2020: 1. Left ventricular ejection fraction, by estimation, is 55 to 60%. The  left ventricle has normal function. The left ventricle has no regional  wall motion abnormalities. There is mild left ventricular hypertrophy.  Left ventricular diastolic parameters  are consistent with Grade I diastolic dysfunction (impaired relaxation).  2. Right ventricular systolic function is normal. The right ventricular  size is normal. Tricuspid regurgitation signal is inadequate for assessing  PA pressure.  3. Left atrial size was mildly dilated.  4. The mitral valve is grossly normal. Trivial mitral valve  regurgitation.  5. The aortic valve is tricuspid. Aortic valve regurgitation is not  visualized.  6. Mildly dilated pulmonary artery.  7. The inferior vena cava is dilated in size with >50% respiratory  variability, suggesting right atrial pressure of 8 mmHg.    Neuro/Psych  Headaches, PSYCHIATRIC DISORDERS Anxiety Depression CVA (R MCA stroke- residual L weakness, on plavix), Residual Symptoms    GI/Hepatic GERD  Medicated and Controlled,(+)     substance abuse (in past)  cocaine use,   Endo/Other  diabetes,  Poorly Controlled, Type 2, Insulin Dependent, Oral Hypoglycemic AgentsObesity BMI 34 Last a1c 11.7 FS 284 this morning  Renal/GU CRFRenal disease (RCC s/p R nephrectomy 2015)CKD 3, Cr 1.36  negative genitourinary   Musculoskeletal  (+) Arthritis , Osteoarthritis,  Diabetic L foot ulcer   Abdominal (+) + obese,   Peds  Hematology negative hematology ROS (+)   Anesthesia Other Findings   Reproductive/Obstetrics negative OB ROS                            Anesthesia Physical Anesthesia Plan  ASA: 4  Anesthesia Plan: MAC   Post-op Pain Management:    Induction:   PONV Risk Score and Plan: 2 and Propofol infusion and TIVA  Airway Management Planned: Natural Airway and Simple Face Mask  Additional Equipment: None  Intra-op Plan:   Post-operative Plan:   Informed Consent: I have reviewed the patients History and Physical, chart, labs and discussed the procedure including the risks, benefits and alternatives for the proposed anesthesia with the patient or authorized representative who has indicated his/her understanding and acceptance.       Plan Discussed with: CRNA  Anesthesia Plan Comments: (Per pt very little feeling in foot, unlikely to benefit from peripheral nerve block)       Anesthesia Quick Evaluation

## 2020-12-26 NOTE — Progress Notes (Signed)
Inpatient Diabetes Program Recommendations  AACE/ADA: New Consensus Statement on Inpatient Glycemic Control (2015)  Target Ranges:  Prepandial:   less than 140 mg/dL      Peak postprandial:   less than 180 mg/dL (1-2 hours)      Critically ill patients:  140 - 180 mg/dL   Lab Results  Component Value Date   GLUCAP 367 (H) 12/26/2020   HGBA1C 11.7 (H) 12/25/2020    Review of Glycemic Control Results for [name], [name] (MRN [id number]) as of 12/26/2020 11:54  Ref. Range 12/25/2020 20:29 12/26/2020 07:41 12/26/2020 11:37  Glucose-Capillary Latest Ref Range: 70 - 99 mg/dL 223 (H) 301 (H) 367 (H)   Diabetes history:  DM2 Outpatient Diabetes medications:  U-500 90 units TID, Novolog 30 units TID, Tradjenta 5 mg QD Current orders for Inpatient glycemic control:  U-500 60 units TID, Novolog 0-20 units TID & 0-5 units QHS,   Inpatient Diabetes Program Recommendations:    Consider increasing U-500 to 70 units TID.   Thanks, Bronson Curb, MSN, RNC-OB Diabetes Coordinator [phone] (8a-5p)

## 2020-12-26 NOTE — Progress Notes (Signed)
PROGRESS NOTE                                                                                                                                                                                                             Patient Demographics:    [name], is a 65 y.o. male, DOB - [birth year], ZDG:[id number]  Outpatient Primary MD for the patient is Garth Bigness (Inactive)   Admit date - 12/24/2020   LOS - 2  Chief Complaint  Patient presents with   diabetic foot ulcer       Brief Narrative: Patient is a 65 y.o. male with PMHx of insulin-dependent DM-2, CKD stage IIIb, HTN, COPD with chronic hypoxic respiratory failure on 6 L of home O2-presented with left diabetic foot.  See below for further details.  COVID-19 vaccinated status: Vaccinated  Significant Events: 5/28>> COVID 19 positive 6/7>> Admit to [facility] for left diabetic foot  Significant studies: 6/7>> x-ray left foot: No definite evidence of osteomyelitis. 6/7>> chest x-ray: No pneumonia. 6/8>> MRI left foot: Osteomyelitis at the distal tuft of great toe  COVID-19 medications: None  Antibiotics: Ancef: 6/7>>  Microbiology data: 6/7 >>blood culture: No growth 6/7>> urine culture: Insignificant growth  Procedures: None  Consults: Podiatry  DVT prophylaxis: enoxaparin (LOVENOX) injection 40 mg Start: 12/25/20 1000    Subjective:   Chest pain or shortness of breath-lying comfortably in bed.   Assessment  & Plan :   Left diabetic foot with acute osteomyelitis of great toe: Continue IV Ancef-cultures negative so far-MRI confirms osteomyelitis of great toe-podiatry following with plans for toe amputation on 6/10. Doubt sepsis present on admission-sepsis ruled out.  COVID-19 infection: Per patient-diagnosed around 5/28-completed treatment with Paxlovid-currently feels that he is back to his baseline-has chronic cough and is on 6 L of  oxygen chronically.  Suspect requires 10 days of isolation.  Insulin-dependent DM-2 with uncontrolled hyperglycemia: CBGs better controlled but still elevated-increase Humulin R 500 to 70 units 3 times daily.  Follow and adjust.   Recent Labs    12/25/20 2029 12/26/20 0741 12/26/20 1137  GLUCAP 223* 301* 367*    CKD stage IIIb: Creatinine close to baseline-follow.  Hyperkalemia: Resolved-treated with Lokelma.  Diabetic polyneuropathy: At baseline  History of CAD-s/p PCI in 2015: No anginal symptoms-continue Plavix, statin, Imdur  COPD with chronic hypoxic  respiratory failure: On 6 L of oxygen at baseline.  Stable-no wheezing-stop Decadron.  Continue bronchodilators  BPH: Continue Flomax  GERD: Continue PPI  Obesity: Estimated body mass index is 33.69 kg/m as calculated from the following:   Height as of this encounter: 5\' 8"  (1.727 m).   Weight as of this encounter: 100.5 kg.    ABG:    Component Value Date/Time   PHART 7.410 06/12/2019 1350   PCO2ART 45.0 06/12/2019 1350   PO2ART 93.1 06/12/2019 1350   HCO3 28.0 06/12/2019 1350   TCO2 25 11/07/2018 1717   ACIDBASEDEF 2.0 11/07/2018 1717   O2SAT 97.6 06/12/2019 1350    Vent Settings: N/A    Condition - Stable  Family Communication  : None at bedside-we will reach out to family over the next few days.  Code Status :  Full Code  Diet :  Diet Order             Diet heart healthy/carb modified Room service appropriate? Yes with Assist; Fluid consistency: Thin  Diet effective now                    Disposition Plan  :   Status is: Inpatient  Remains inpatient appropriate because:Inpatient level of care appropriate due to severity of illness   Dispo:  Patient From: Home  Planned Disposition: Home  Medically stable for discharge: No     Barriers to discharge: Left diabetic foot-on IV antibiotics-amputation scheduled for 6/10.  Antimicorbials  :    Anti-infectives (From admission, onward)     Start     Dose/Rate Route Frequency Ordered Stop   12/26/20 1000  remdesivir 100 mg in sodium chloride 0.9 % 100 mL IVPB  Status:  Discontinued       See Hyperspace for full Linked Orders Report.   100 mg 200 mL/hr over 30 Minutes Intravenous Daily 12/24/20 2157 12/24/20 2238   12/25/20 0000  remdesivir 200 mg in sodium chloride 0.9% 250 mL IVPB  Status:  Discontinued       See Hyperspace for full Linked Orders Report.   200 mg 580 mL/hr over 30 Minutes Intravenous Once 12/24/20 2157 12/24/20 2238   12/24/20 2230  ceFAZolin (ANCEF) IVPB 2g/100 mL premix        2 g 200 mL/hr over 30 Minutes Intravenous Every 8 hours 12/24/20 2131     12/24/20 1715  vancomycin (VANCOCIN) IVPB 1000 mg/200 mL premix       See Hyperspace for full Linked Orders Report.   1,000 mg 200 mL/hr over 60 Minutes Intravenous  Once 12/24/20 1545 12/24/20 1920   12/24/20 1545  vancomycin (VANCOCIN) IVPB 1000 mg/200 mL premix  Status:  Discontinued        1,000 mg 200 mL/hr over 60 Minutes Intravenous  Once 12/24/20 1537 12/24/20 1545   12/24/20 1545  vancomycin (VANCOCIN) IVPB 1000 mg/200 mL premix       See Hyperspace for full Linked Orders Report.   1,000 mg 200 mL/hr over 60 Minutes Intravenous  Once 12/24/20 1545 12/24/20 1735       Inpatient Medications  Scheduled Meds:  vitamin C  500 mg Oral Daily   atorvastatin  20 mg Oral QHS   cholecalciferol  1,000 Units Oral Daily   clopidogrel  75 mg Oral Daily   enoxaparin (LOVENOX) injection  40 mg Subcutaneous O27X   folic acid  1 mg Oral Daily   gabapentin  300 mg Oral BID  insulin aspart  0-20 Units Subcutaneous TID WC   insulin aspart  0-5 Units Subcutaneous QHS   insulin regular human CONCENTRATED  60 Units Subcutaneous TID WC   isosorbide mononitrate  60 mg Oral Daily   mometasone-formoterol  2 puff Inhalation BID   multivitamin with minerals  1 tablet Oral Daily   pantoprazole  40 mg Oral Daily   tamsulosin  0.4 mg Oral Daily   thiamine  100  mg Oral Daily   zinc sulfate  220 mg Oral Daily   Continuous Infusions:   ceFAZolin (ANCEF) IV 2 g (12/26/20 0628)   PRN Meds:.ALPRAZolam, diphenhydrAMINE, guaiFENesin-dextromethorphan, oxyCODONE-acetaminophen, zolpidem   Time Spent in minutes  35   See all Orders from today for further details   Oren Binet M.D on 12/26/2020 at 12:21 PM  To page go to [email or web address] - use universal password  [facility] -  Office  [phone]    Objective:   Vitals:   12/25/20 1351 12/25/20 2031 12/26/20 0500 12/26/20 0544  BP: (!) 172/73 134/68  (!) 175/64  Pulse:  78  79  Resp:  20  17  Temp: 98 F (36.7 C) 97.9 F (36.6 C)  98 F (36.7 C)  TempSrc: Oral Oral  Oral  SpO2:  95%  94%  Weight:   100.5 kg   Height:        Wt Readings from Last 3 Encounters:  12/26/20 100.5 kg  06/10/20 103.4 kg  04/18/20 105.4 kg     Intake/Output Summary (Last 24 hours) at 12/26/2020 1221 Last data filed at 12/25/2020 2051 Gross per 24 hour  Intake 420 ml  Output 1650 ml  Net -1230 ml      Physical Exam Gen Exam:Alert awake-not in any distress HEENT:atraumatic, normocephalic Chest: B/L clear to auscultation anteriorly CVS:S1S2 regular Abdomen:soft non tender, non distended Extremities:no edema-left foot wrapped-did not open dressing. Neurology: Non focal Skin: no rash    Data Review:    CBC Recent Labs  Lab 12/24/20 1545 12/25/20 0408 12/26/20 0115  WBC 12.6* 9.2 10.2  HGB 13.0 12.2* 11.3*  HCT 38.8* 36.8* 33.3*  PLT 213 192 209  MCV 80.8 82.5 80.4  MCH 27.1 27.4 27.3  MCHC 33.5 33.2 33.9  RDW 13.8 13.6 13.2  LYMPHSABS 1.1 0.6* 0.8  MONOABS 0.8 0.2 0.6  EOSABS 0.0 0.0 0.0  BASOSABS 0.0 0.0 0.0     Chemistries  Recent Labs  Lab 12/24/20 1545 12/25/20 0408 12/26/20 0115  NA 130* 132* 135  K 3.8 5.7* 3.6  CL 95* 100 101  CO2 22 21* 24  GLUCOSE 238* 423* 202*  BUN 24* 26* 20  CREATININE 1.81* 1.57* 1.36*  CALCIUM 9.1 8.3* 8.6*  MG  --  1.3*  --    AST 7* 18 11*  ALT 8 12 10   ALKPHOS 81 76 58  BILITOT 0.7 1.7* 0.3    ------------------------------------------------------------------------------------------------------------------ No results for input(s): CHOL, HDL, LDLCALC, TRIG, CHOLHDL, LDLDIRECT in the last 72 hours.  Lab Results  Component Value Date   HGBA1C 11.7 (H) 12/25/2020   ------------------------------------------------------------------------------------------------------------------ No results for input(s): TSH, T4TOTAL, T3FREE, THYROIDAB in the last 72 hours.  Invalid input(s): FREET3 ------------------------------------------------------------------------------------------------------------------ Recent Labs    12/25/20 0408  FERRITIN 288     Coagulation profile Recent Labs  Lab 12/24/20 1545  INR 1.1     Recent Labs    12/25/20 0408  DDIMER 0.93*     Cardiac Enzymes No results for input(s): CKMB, TROPONINI, MYOGLOBIN  in the last 168 hours.  Invalid input(s): CK ------------------------------------------------------------------------------------------------------------------    Component Value Date/Time   BNP 84.2 04/12/2020 1348    Micro Results Recent Results (from the past 240 hour(s))  Blood Culture (routine x 2)     Status: None (Preliminary result)   Collection Time: 12/24/20  3:50 PM   Specimen: BLOOD  Result Value Ref Range Status   Specimen Description BLOOD LEFT ANTECUBITAL  Final   Special Requests   Final    BOTTLES DRAWN AEROBIC AND ANAEROBIC Blood Culture adequate volume   Culture   Final    NO GROWTH 2 DAYS Performed at [facility], [address] [place], [place] [address]    Report Status PENDING  Incomplete  Resp Panel by RT-PCR (Flu A&B, Covid)     Status: Abnormal   Collection Time: 12/24/20  4:00 PM   Specimen: Nasopharyngeal(NP) swabs in vial transport medium  Result Value Ref Range Status   SARS Coronavirus 2 by RT PCR POSITIVE (A) NEGATIVE  Final    Comment: RESULT CALLED TO, READ BACK BY AND VERIFIED WITH: Radene Ou B RN [id number] 12/24/20 (NOTE) SARS-CoV-2 target nucleic acids are DETECTED.  The SARS-CoV-2 RNA is generally detectable in upper respiratory specimens during the acute phase of infection. Positive results are indicative of the presence of the identified virus, but do not rule out bacterial infection or co-infection with other pathogens not detected by the test. Clinical correlation with patient history and other diagnostic information is necessary to determine patient infection status. The expected result is Negative.  Fact Sheet for Patients: EntrepreneurPulse.com.au  Fact Sheet for Healthcare Providers: IncredibleEmployment.be  This test is not yet approved or cleared by the [place] FDA and  has been authorized for detection and/or diagnosis of SARS-CoV-2 by FDA under an Emergency Use Authorization (EUA).  This EUA will remain in effect (meaning this test can be used)  for the duration of  the COVID-19 declaration under Section 564(b)(1) of the Act, 21 U.S.C. section 360bbb-3(b)(1), unless the authorization is terminated or revoked sooner.     Influenza A by PCR NEGATIVE NEGATIVE Final   Influenza B by PCR NEGATIVE NEGATIVE Final    Comment: (NOTE) The Xpert Xpress SARS-CoV-2/FLU/RSV plus assay is intended as an aid in the diagnosis of influenza from Nasopharyngeal swab specimens and should not be used as a sole basis for treatment. Nasal washings and aspirates are unacceptable for Xpert Xpress SARS-CoV-2/FLU/RSV testing.  Fact Sheet for Patients: EntrepreneurPulse.com.au  Fact Sheet for Healthcare Providers: IncredibleEmployment.be  This test is not yet approved or cleared by the [place] FDA and has been authorized for detection and/or diagnosis of SARS-CoV-2 by FDA under an Emergency Use Authorization (EUA). This  EUA will remain in effect (meaning this test can be used) for the duration of the COVID-19 declaration under Section 564(b)(1) of the Act, 21 U.S.C. section 360bbb-3(b)(1), unless the authorization is terminated or revoked.  Performed at [facility], [address], [place], [place] [address]   Urine culture     Status: Abnormal   Collection Time: 12/24/20  8:08 PM   Specimen: In/Out Cath Urine  Result Value Ref Range Status   Specimen Description   Final    IN/OUT CATH URINE Performed at [facility], [address], [place], [place] [address]    Special Requests   Final    NONE Performed at [facility], [address] [place], [place] [address]  Culture (A)  Final    <10,000 COLONIES/mL INSIGNIFICANT GROWTH Performed at [facility] [address] [place], [place] [address]    Report Status 12/26/2020 FINAL  Final    Radiology Reports MR FOOT LEFT WO CONTRAST  Result Date: 12/25/2020 CLINICAL DATA:  Osteomyelitis, foot osteomyeleitis EXAM: MRI OF THE LEFT FOOT WITHOUT CONTRAST TECHNIQUE: Multiplanar, multisequence MR imaging of the left foot was performed. No intravenous contrast was administered. COMPARISON:  Left foot radiograph 12/24/2020 FINDINGS: Bones/Joint/Cartilage There is a small area of edema signal and confluent low T1 signal within the tuft of the distal phalanx of the great toe. There is edema signal without confluent low T1 signal in the great toe proximal phalanx. Ligaments Intact Lisfranc ligament.  No evidence of plantar plate tear Muscles and Tendons There is diffuse muscle atrophy throughout the foot. Soft tissues There is a plantar soft tissue ulcer along the great toe. Diffuse soft tissue edema. IMPRESSION: Findings consistent with a small area of osteomyelitis in the distal tuft of the great toe. Mild edema signal within the great toe proximal phalanx which could represent reactive marrow  change or early osteomyelitis. Plantar soft tissue ulcer noted along the great toe. No evidence of soft tissue abscess. Electronically Signed   By: Maurine Simmering   On: 12/25/2020 18:58   DG Chest Portable 1 View  Result Date: 12/24/2020 CLINICAL DATA:  Questionable sepsis, diabetes mellitus, COPD EXAM: PORTABLE CHEST 1 VIEW COMPARISON:  Portable exam 1600 hours compared to 04/12/2020 FINDINGS: Enlargement of cardiac silhouette. Mediastinal contours and pulmonary vascularity normal. Atherosclerotic calcification aorta. Parenchymal scarring at the mid to inferior LEFT chest. Bibasilar atelectasis. Lungs otherwise clear. No definite acute infiltrate, pleural effusion, or pneumothorax. Osseous structures unremarkable. IMPRESSION: Bibasilar atelectasis and chronic LEFT basilar scarring. Enlargement of cardiac silhouette. Aortic Atherosclerosis (ICD10-I70.0). Electronically Signed   By: Lavonia Dana M.D.   On: 12/24/2020 16:18   DG Foot Complete Left  Result Date: 12/24/2020 CLINICAL DATA:  Plantar ulceration great toe, diabetes mellitus, question osteomyelitis EXAM: LEFT FOOT - COMPLETE 3+ VIEW COMPARISON:  05/25/2005 FINDINGS: Osseous mineralization decreased. Joint spaces preserved. No acute fracture or dislocation. Ulcer identified at plantar aspect of LEFT foot at the level of the proximal phalanx. Small soft tissue calcification versus radiopaque foreign body at ulcer 1.2 mm diameter. No bone destruction to suggest osteomyelitis. IMPRESSION: No definite evidence of osteomyelitis. If this remains a clinical concern, consider MR assessment. Electronically Signed   By: Lavonia Dana M.D.   On: 12/24/2020 16:21   VAS [place] ABI WITH/WO TBI  Result Date: 12/26/2020  LOWER EXTREMITY DOPPLER STUDY Patient Name:  GONSALO CUTHBERTSON  Date of Exam:   12/26/2020 Medical Rec #: [id number]       Accession #:    [id number] Date of Birth: Jul 09, 1956      Patient Gender: M Patient Age:   64Y Exam Location:  [facility]  Procedure:      VAS [place] ABI WITH/WO TBI Referring Phys: [id number] [place] --------------------------------------------------------------------------------  Indications: Parasthesia. High Risk Factors: Hypertension, Diabetes.  Limitations: Today's exam was limited due to an open wound, involuntary patient              movement and bandages. Comparison Study: No prior studies. Performing Technologist: Carlos Levering RVT  Examination Guidelines: A complete evaluation includes at minimum, Doppler waveform signals and systolic blood pressure reading at the level of bilateral brachial, anterior tibial, and posterior tibial arteries, when vessel segments  are accessible. Bilateral testing is considered an integral part of a complete examination. Photoelectric Plethysmograph (PPG) waveforms and toe systolic pressure readings are included as required and additional duplex testing as needed. Limited examinations for reoccurring indications may be performed as noted.  ABI Findings: +---------+------------------+-----+---------+--------+ Right    Rt Pressure (mmHg)IndexWaveform Comment  +---------+------------------+-----+---------+--------+ Brachial 153                    triphasic         +---------+------------------+-----+---------+--------+ PTA      120               0.78 biphasic          +---------+------------------+-----+---------+--------+ DP       125               0.82 biphasic          +---------+------------------+-----+---------+--------+ Great Toe95                0.62                   +---------+------------------+-----+---------+--------+ +--------+------------------+-----+----------+-------+ Left    Lt Pressure (mmHg)IndexWaveform  Comment +--------+------------------+-----+----------+-------+ [id number]                    biphasic          +--------+------------------+-----+----------+-------+ PTA     117               0.76 monophasic         +--------+------------------+-----+----------+-------+ DP      99                0.65 monophasic        +--------+------------------+-----+----------+-------+ +-------+-----------+-----------+------------+------------+ ABI/TBIToday's ABIToday's TBIPrevious ABIPrevious TBI +-------+-----------+-----------+------------+------------+ Right  0.82       0.62                                +-------+-----------+-----------+------------+------------+ Left   0.76                                           +-------+-----------+-----------+------------+------------+  Summary: Right: Resting right ankle-brachial index indicates mild right lower extremity arterial disease. The right toe-brachial index is abnormal. Left: Resting left ankle-brachial index indicates moderate left lower extremity arterial disease. Unable to obtain TBI due to open wounds.  *See table(s) above for measurements and observations.     Preliminary    ECHOCARDIOGRAM COMPLETE  Result Date: 12/06/2020    ECHOCARDIOGRAM REPORT   Patient Name:   RAJIV PARLATO [facility] Date of Exam: 12/06/2020 Medical Rec #:  [id number]      Height:       66.0 in Accession #:    [id number]     Weight:       228.0 lb Date of Birth:  1956/06/21     BSA:          2.114 m Patient Age:    35 years       BP:           87/52 mmHg Patient Gender: M              HR:           91 bpm. Exam Location:  Outpatient Procedure: 2D Echo, Cardiac Doppler and Color Doppler Indications:  I27.20 Pulmonary Hypertension  History:        Patient has prior history of Echocardiogram examinations, most                 recent 07/05/2015. CAD, Stroke and COPD; Risk                 Factors:Hypertension, Diabetes, Dyslipidemia, Former Smoker and                 Sleep Apnea. Cocaine abuse. GERD.  Sonographer:    Tiffany Dance Referring Phys: [id number] Progress  1. Left ventricular ejection fraction, by estimation, is 55 to 60%. The left ventricle has normal function. The  left ventricle has no regional wall motion abnormalities. There is mild left ventricular hypertrophy. Left ventricular diastolic parameters are consistent with Grade I diastolic dysfunction (impaired relaxation).  2. Right ventricular systolic function is normal. The right ventricular size is normal. Tricuspid regurgitation signal is inadequate for assessing PA pressure.  3. Left atrial size was mildly dilated.  4. The mitral valve is grossly normal. Trivial mitral valve regurgitation.  5. The aortic valve is tricuspid. Aortic valve regurgitation is not visualized.  6. Mildly dilated pulmonary artery.  7. The inferior vena cava is dilated in size with >50% respiratory variability, suggesting right atrial pressure of 8 mmHg. Comparison(s): Prior images unable to be directly viewed, comparison made by report only. Changes from prior study are noted. 07/05/2015: LVEF 60-65%. FINDINGS  Left Ventricle: Left ventricular ejection fraction, by estimation, is 55 to 60%. The left ventricle has normal function. The left ventricle has no regional wall motion abnormalities. The left ventricular internal cavity size was normal in size. There is  mild left ventricular hypertrophy. Left ventricular diastolic parameters are consistent with Grade I diastolic dysfunction (impaired relaxation). Indeterminate filling pressures. Right Ventricle: The right ventricular size is normal. No increase in right ventricular wall thickness. Right ventricular systolic function is normal. Tricuspid regurgitation signal is inadequate for assessing PA pressure. Left Atrium: Left atrial size was mildly dilated. Right Atrium: Right atrial size was normal in size. Pericardium: There is no evidence of pericardial effusion. Mitral Valve: The mitral valve is grossly normal. Trivial mitral valve regurgitation. Tricuspid Valve: The tricuspid valve is grossly normal. Tricuspid valve regurgitation is trivial. Aortic Valve: The aortic valve is tricuspid. Aortic  valve regurgitation is not visualized. Pulmonic Valve: The pulmonic valve was grossly normal. Pulmonic valve regurgitation is trivial. Aorta: The aortic root and ascending aorta are structurally normal, with no evidence of dilitation. Pulmonary Artery: The pulmonary artery is mildly dilated. Venous: The inferior vena cava is dilated in size with greater than 50% respiratory variability, suggesting right atrial pressure of 8 mmHg. IAS/Shunts: No atrial level shunt detected by color flow Doppler.  LEFT VENTRICLE PLAX 2D LVIDd:         4.90 cm LVIDs:         3.40 cm LV PW:         1.50 cm LV IVS:        1.10 cm LVOT diam:     2.10 cm LV SV:         73 LV SV Index:   35 LVOT Area:     3.46 cm  RIGHT VENTRICLE             IVC RV Basal diam:  3.00 cm     IVC diam: 2.50 cm RV Mid diam:    2.20 cm RV S prime:  11.40 cm/s TAPSE (M-mode): 1.6 cm LEFT ATRIUM           Index       RIGHT ATRIUM           Index LA diam:      3.90 cm 1.84 cm/m  RA Area:     11.40 cm LA Vol (A2C): 38.5 ml 18.21 ml/m RA Volume:   23.70 ml  11.21 ml/m LA Vol (A4C): 67.4 ml 31.88 ml/m  AORTIC VALVE LVOT Vmax:   115.67 cm/s LVOT Vmean:  78.900 cm/s LVOT VTI:    0.211 m  AORTA Ao Root diam: 3.30 cm Ao Asc diam:  3.40 cm MITRAL VALVE MV Area (PHT): 2.73 cm    SHUNTS MV Decel Time: 278 msec    Systemic VTI:  0.21 m MV E velocity: 56.30 cm/s  Systemic Diam: 2.10 cm MV A velocity: 80.40 cm/s MV E/A ratio:  0.70 Lyman Bishop MD Electronically signed by Lyman Bishop MD Signature Date/Time: 12/06/2020/3:49:44 PM    Final

## 2020-12-26 NOTE — Plan of Care (Signed)

## 2020-12-26 NOTE — Progress Notes (Signed)
ABI's have been completed. Preliminary results can be found in CV Proc through chart review.   12/26/20 11:18 AM [name] RVT

## 2020-12-27 ENCOUNTER — Inpatient Hospital Stay (HOSPITAL_COMMUNITY): Payer: Medicare Other | Admitting: Certified Registered Nurse Anesthetist

## 2020-12-27 ENCOUNTER — Encounter (HOSPITAL_COMMUNITY): Payer: Self-pay | Admitting: Internal Medicine

## 2020-12-27 ENCOUNTER — Inpatient Hospital Stay (HOSPITAL_COMMUNITY): Payer: Medicare Other

## 2020-12-27 ENCOUNTER — Encounter (HOSPITAL_COMMUNITY)
Admission: EM | Disposition: A | Discharge status: Home / Self Care | Payer: Self-pay | Source: Home / Self Care | Attending: Internal Medicine

## 2020-12-27 DIAGNOSIS — L97529 Non-pressure chronic ulcer of other part of left foot with unspecified severity: Secondary | ICD-10-CM

## 2020-12-27 DIAGNOSIS — E11621 Type 2 diabetes mellitus with foot ulcer: Secondary | ICD-10-CM

## 2020-12-27 DIAGNOSIS — I10 Essential (primary) hypertension: Secondary | ICD-10-CM

## 2020-12-27 DIAGNOSIS — M869 Osteomyelitis, unspecified: Secondary | ICD-10-CM

## 2020-12-27 HISTORY — PX: AMPUTATION: SHX166

## 2020-12-27 LAB — GLUCOSE, CAPILLARY
Glucose-Capillary: 236 mg/dL — ABNORMAL HIGH (ref 70–99)
Glucose-Capillary: 261 mg/dL — ABNORMAL HIGH (ref 70–99)
Glucose-Capillary: 282 mg/dL — ABNORMAL HIGH (ref 70–99)
Glucose-Capillary: 284 mg/dL — ABNORMAL HIGH (ref 70–99)
Glucose-Capillary: 296 mg/dL — ABNORMAL HIGH (ref 70–99)
Glucose-Capillary: 315 mg/dL — ABNORMAL HIGH (ref 70–99)
Glucose-Capillary: 319 mg/dL — ABNORMAL HIGH (ref 70–99)
Glucose-Capillary: 356 mg/dL — ABNORMAL HIGH (ref 70–99)

## 2020-12-27 LAB — MRSA PCR SCREENING: MRSA by PCR: NEGATIVE

## 2020-12-27 LAB — CBC WITH DIFFERENTIAL/PLATELET
Abs Immature Granulocytes: 0.06 10*3/uL (ref 0.00–0.07)
Basophils Absolute: 0 10*3/uL (ref 0.0–0.1)
Basophils Relative: 0 %
Eosinophils Absolute: 0 10*3/uL (ref 0.0–0.5)
Eosinophils Relative: 0 %
HCT: 34.2 % — ABNORMAL LOW (ref 39.0–52.0)
Hemoglobin: 11.4 g/dL — ABNORMAL LOW (ref 13.0–17.0)
Immature Granulocytes: 1 %
Lymphocytes Relative: 15 %
Lymphs Abs: 1.1 10*3/uL (ref 0.7–4.0)
MCH: 27.4 pg (ref 26.0–34.0)
MCHC: 33.3 g/dL (ref 30.0–36.0)
MCV: 82.2 fL (ref 80.0–100.0)
Monocytes Absolute: 0.4 10*3/uL (ref 0.1–1.0)
Monocytes Relative: 6 %
Neutro Abs: 5.6 10*3/uL (ref 1.7–7.7)
Neutrophils Relative %: 78 %
Platelets: 204 10*3/uL (ref 150–400)
RBC: 4.16 MIL/uL — ABNORMAL LOW (ref 4.22–5.81)
RDW: 13.4 % (ref 11.5–15.5)
WBC: 7.2 10*3/uL (ref 4.0–10.5)
nRBC: 0 % (ref 0.0–0.2)

## 2020-12-27 LAB — COMPREHENSIVE METABOLIC PANEL
ALT: 8 U/L (ref 0–44)
AST: 10 U/L — ABNORMAL LOW (ref 15–41)
Albumin: 2.5 g/dL — ABNORMAL LOW (ref 3.5–5.0)
Alkaline Phosphatase: 72 U/L (ref 38–126)
Anion gap: 10 (ref 5–15)
BUN: 19 mg/dL (ref 8–23)
CO2: 23 mmol/L (ref 22–32)
Calcium: 8.4 mg/dL — ABNORMAL LOW (ref 8.9–10.3)
Chloride: 99 mmol/L (ref 98–111)
Creatinine, Ser: 1.4 mg/dL — ABNORMAL HIGH (ref 0.61–1.24)
GFR, Estimated: 56 mL/min — ABNORMAL LOW (ref >60)
Glucose, Bld: 427 mg/dL — ABNORMAL HIGH (ref 70–99)
Potassium: 4.1 mmol/L (ref 3.5–5.1)
Sodium: 132 mmol/L — ABNORMAL LOW (ref 135–145)
Total Bilirubin: 0.3 mg/dL (ref 0.3–1.2)
Total Protein: 6 g/dL — ABNORMAL LOW (ref 6.5–8.1)

## 2020-12-27 LAB — GLUCOSE, RANDOM: Glucose, Bld: 479 mg/dL — ABNORMAL HIGH (ref 70–99)

## 2020-12-27 SURGERY — AMPUTATION, FOOT, PARTIAL
Anesthesia: Monitor Anesthesia Care | Site: Foot | Laterality: Left

## 2020-12-27 MED ORDER — INSULIN ASPART 100 UNIT/ML IJ SOLN: 10.0000 [IU] | Freq: Once | INTRAMUSCULAR | Status: AC

## 2020-12-27 MED ORDER — CHLORHEXIDINE GLUCONATE 0.12 % MT SOLN
OROMUCOSAL | Status: AC
  Administered 2020-12-27: 15 mL via OROMUCOSAL
  Filled 2020-12-27: qty 15

## 2020-12-27 MED ORDER — LIDOCAINE HCL 1 % IJ SOLN
INTRAMUSCULAR | Status: AC
  Filled 2020-12-27: qty 20

## 2020-12-27 MED ORDER — MIDAZOLAM HCL 2 MG/2ML IJ SOLN
INTRAMUSCULAR | Status: DC | PRN
  Administered 2020-12-27: 2 mg via INTRAVENOUS

## 2020-12-27 MED ORDER — 0.9 % SODIUM CHLORIDE (POUR BTL) OPTIME
TOPICAL | Status: DC | PRN
  Administered 2020-12-27: 1000 mL

## 2020-12-27 MED ORDER — ORAL CARE MOUTH RINSE
15.0000 mL | Freq: Once | OROMUCOSAL | Status: AC
Start: 2020-12-27 — End: 2020-12-27

## 2020-12-27 MED ORDER — INSULIN ASPART 100 UNIT/ML IJ SOLN
7.0000 [IU] | Freq: Once | INTRAMUSCULAR | Status: AC
  Administered 2020-12-27: 7 [IU] via SUBCUTANEOUS

## 2020-12-27 MED ORDER — INSULIN ASPART 100 UNIT/ML IJ SOLN
0.0000 [IU] | INTRAMUSCULAR | Status: DC
  Administered 2020-12-27: 3 [IU] via SUBCUTANEOUS
  Administered 2020-12-27: 5 [IU] via SUBCUTANEOUS
  Administered 2020-12-27: 9 [IU] via SUBCUTANEOUS
  Administered 2020-12-27: 5 [IU] via SUBCUTANEOUS
  Administered 2020-12-28 (×2): 3 [IU] via SUBCUTANEOUS
  Administered 2020-12-28: 5 [IU] via SUBCUTANEOUS

## 2020-12-27 MED ORDER — HYDROMORPHONE HCL 1 MG/ML IJ SOLN: 0.2500 mg | INTRAMUSCULAR | Status: DC | PRN

## 2020-12-27 MED ORDER — PROPOFOL 500 MG/50ML IV EMUL
INTRAVENOUS | Status: DC | PRN
  Administered 2020-12-27: 125 ug/kg/min via INTRAVENOUS

## 2020-12-27 MED ORDER — BUPIVACAINE HCL (PF) 0.25 % IJ SOLN
INTRAMUSCULAR | Status: AC
  Filled 2020-12-27: qty 30

## 2020-12-27 MED ORDER — INSULIN ASPART 100 UNIT/ML IJ SOLN
INTRAMUSCULAR | Status: AC
  Administered 2020-12-27: 10 [IU] via SUBCUTANEOUS
  Filled 2020-12-27: qty 1

## 2020-12-27 MED ORDER — LIDOCAINE 1 % OPTIME INJ - NO CHARGE
INTRAMUSCULAR | Status: DC | PRN
  Administered 2020-12-27: 5 mL

## 2020-12-27 MED ORDER — MAGNESIUM SULFATE 2 GM/50ML IV SOLN
2.0000 g | Freq: Once | INTRAVENOUS | Status: AC
  Administered 2020-12-27: 2 g via INTRAVENOUS
  Filled 2020-12-27: qty 50

## 2020-12-27 MED ORDER — LACTATED RINGERS IV SOLN: INTRAVENOUS | Status: DC | PRN

## 2020-12-27 MED ORDER — LACTATED RINGERS IV SOLN: INTRAVENOUS | Status: DC

## 2020-12-27 MED ORDER — HYDROCODONE-ACETAMINOPHEN 7.5-325 MG PO TABS: 1.0000 | ORAL_TABLET | Freq: Once | ORAL | Status: DC | PRN

## 2020-12-27 MED ORDER — ACETAMINOPHEN 500 MG PO TABS
1000.0000 mg | ORAL_TABLET | Freq: Once | ORAL | Status: AC
  Administered 2020-12-27: 1000 mg via ORAL
  Filled 2020-12-27: qty 2

## 2020-12-27 MED ORDER — BUPIVACAINE HCL 0.25 % IJ SOLN
INTRAMUSCULAR | Status: DC | PRN
  Administered 2020-12-27: 5 mL

## 2020-12-27 MED ORDER — MIDAZOLAM HCL 2 MG/2ML IJ SOLN
INTRAMUSCULAR | Status: AC
  Filled 2020-12-27: qty 2

## 2020-12-27 MED ORDER — ONDANSETRON HCL 4 MG/2ML IJ SOLN: 4.0000 mg | Freq: Once | INTRAMUSCULAR | Status: DC | PRN

## 2020-12-27 MED ORDER — CHLORHEXIDINE GLUCONATE 0.12 % MT SOLN: 15.0000 mL | Freq: Once | OROMUCOSAL | Status: AC

## 2020-12-27 SURGICAL SUPPLY — 49 items
APL PRP STRL LF DISP 70% ISPRP (MISCELLANEOUS) ×1
BLADE AVERAGE 25X9 (BLADE) IMPLANT
BLADE SURG 15 STRL LF DISP TIS (BLADE) ×2 IMPLANT
BLADE SURG 15 STRL SS (BLADE) ×4
BNDG ELASTIC 3X5.8 VLCR STR LF (GAUZE/BANDAGES/DRESSINGS) IMPLANT
BNDG ELASTIC 4X5.8 VLCR STR LF (GAUZE/BANDAGES/DRESSINGS) ×2 IMPLANT
BNDG GAUZE ELAST 4 BULKY (GAUZE/BANDAGES/DRESSINGS) ×2 IMPLANT
CHLORAPREP W/TINT 26 (MISCELLANEOUS) ×2 IMPLANT
COVER BACK TABLE 60X90IN (DRAPES) ×2 IMPLANT
COVER WAND RF STERILE (DRAPES) IMPLANT
DRAPE EXTREMITY T 121X128X90 (DISPOSABLE) ×2 IMPLANT
DRAPE IMP U-DRAPE 54X76 (DRAPES) ×2 IMPLANT
DRAPE SURG 17X23 STRL (DRAPES) IMPLANT
DRAPE U-SHAPE 47X51 STRL (DRAPES) ×2 IMPLANT
DRSG EMULSION OIL 3X3 NADH (GAUZE/BANDAGES/DRESSINGS) ×2 IMPLANT
DRSG PAD ABDOMINAL 8X10 ST (GAUZE/BANDAGES/DRESSINGS) ×2 IMPLANT
ELECT REM PT RETURN 9FT ADLT (ELECTROSURGICAL) ×2
ELECTRODE REM PT RTRN 9FT ADLT (ELECTROSURGICAL) ×1 IMPLANT
GAUZE 4X4 16PLY RFD (DISPOSABLE) IMPLANT
GAUZE SPONGE 4X4 12PLY STRL (GAUZE/BANDAGES/DRESSINGS) ×2 IMPLANT
GLOVE BIO SURGEON STRL SZ7 (GLOVE) ×2 IMPLANT
GLOVE BIOGEL PI IND STRL 7.5 (GLOVE) ×1 IMPLANT
GLOVE BIOGEL PI INDICATOR 7.5 (GLOVE) ×1
GOWN STRL REUS W/ TWL LRG LVL3 (GOWN DISPOSABLE) ×1 IMPLANT
GOWN STRL REUS W/ TWL XL LVL3 (GOWN DISPOSABLE) ×1 IMPLANT
GOWN STRL REUS W/TWL LRG LVL3 (GOWN DISPOSABLE) ×2
GOWN STRL REUS W/TWL XL LVL3 (GOWN DISPOSABLE) ×2
KIT BASIN OR (CUSTOM PROCEDURE TRAY) ×2 IMPLANT
MARKER SKIN DUAL TIP RULER LAB (MISCELLANEOUS) ×2 IMPLANT
MICROMATRIX 500MG (Tissue) ×2 IMPLANT
NDL SAFETY ECLIPSE 18X1.5 (NEEDLE) IMPLANT
NEEDLE HYPO 18GX1.5 SHARP (NEEDLE)
NEEDLE HYPO 25X1 1.5 SAFETY (NEEDLE) ×2 IMPLANT
NS IRRIG 1000ML POUR BTL (IV SOLUTION) IMPLANT
PACK ORTHO EXTREMITY (CUSTOM PROCEDURE TRAY) ×2 IMPLANT
PADDING CAST ABS 4INX4YD NS (CAST SUPPLIES) ×2
PADDING CAST ABS COTTON 4X4 ST (CAST SUPPLIES) ×2 IMPLANT
PENCIL SMOKE EVACUATOR (MISCELLANEOUS) ×2 IMPLANT
SOLUTION PARTIC MCRMTRX 500MG (Tissue) ×1 IMPLANT
STOCKINETTE 6  STRL (DRAPES) ×2
STOCKINETTE 6 STRL (DRAPES) ×1 IMPLANT
SUT PROLENE 2 0 CT2 30 (SUTURE) ×2 IMPLANT
SUT PROLENE 3 0 PS 2 (SUTURE) ×4 IMPLANT
SWAB CULTURE LIQUID MINI MALE (MISCELLANEOUS) IMPLANT
SYR BULB EAR ULCER 3OZ GRN STR (SYRINGE) ×2 IMPLANT
SYR CONTROL 10ML LL (SYRINGE) IMPLANT
TUBE CONNECTING 20X1/4 (TUBING) ×2 IMPLANT
UNDERPAD 30X36 HEAVY ABSORB (UNDERPADS AND DIAPERS) ×2 IMPLANT
YANKAUER SUCT BULB TIP NO VENT (SUCTIONS) ×2 IMPLANT

## 2020-12-27 NOTE — Progress Notes (Signed)
Notified by tele of 5 beat run of v-tach.  MD notified.

## 2020-12-27 NOTE — Plan of Care (Signed)

## 2020-12-27 NOTE — Transfer of Care (Signed)
Immediate Anesthesia Transfer of Care Note  Patient: [name]  Procedure(s) Performed: PARTIAL FIRST RAY AMPUTATION OF FOOT (Left: Foot)  Patient Location: PACU  Anesthesia Type:MAC  Level of Consciousness: drowsy and patient cooperative  Airway & Oxygen Therapy: Patient Spontanous Breathing and Patient connected to face mask oxygen  Post-op Assessment: Report given to RN and Post -op Vital signs reviewed and stable  Post vital signs: Reviewed and stable  Last Vitals:  Vitals Value Taken Time  BP 155/79 12/27/20 1300  Temp 37.3 C 12/27/20 1300  Pulse 82 12/27/20 1300  Resp 17 12/27/20 1300  SpO2 97 % 12/27/20 1300  Vitals shown include unvalidated device data.  Last Pain:  Vitals:   12/27/20 1122  TempSrc: Oral  PainSc: 0-No pain         Complications: No notable events documented.

## 2020-12-27 NOTE — Op Note (Signed)
Surgeon: Surgeon(s): Felipa Furnace, DPM  Assistants: None Pre-operative diagnosis: Left hallux osteomyelitis Post-operative diagnosis: same Procedure: Procedure(s) (LRB): PARTIAL FIRST RAY AMPUTATION OF FOOT (Left)  Pathology:  ID Type Source Tests Collected by Time Destination  1 : clean margin of Left first ray of foot Tissue PATH Digit amputation SURGICAL PATHOLOGY Felipa Furnace, DPM 12/27/2020 1226   2 : Left first ray of foot Tissue PATH Digit amputation SURGICAL PATHOLOGY Felipa Furnace, DPM 12/27/2020 1240     Pertinent Intra-op findings: Osteomyelitic changes noted at the base of the proximal phalanx as well as the sesamoid and the metatarsal head Anesthesia: General  Hemostasis: None EBL: 100 cc Materials: 3-0 Prolene and micro matrix Injectables: None Complications: None  Indications for surgery: A 65 y.o. male presents with left foot osteomyelitis with soft tissue ulcer. Patient has failed all conservative therapy including but not limited to local wound care and IV antibiotics. He wishes to have surgical correction of the foot/deformity. It was determined that patient would benefit from left partial first ray amputation with primary closure. Informed surgical risk consent was reviewed and read aloud to the patient.  I reviewed the films.  I have discussed my findings with the patient in great detail.  I have discussed all risks including but not limited to infection, stiffness, scarring, limp, disability, deformity, damage to blood vessels and nerves, numbness, poor healing, need for braces, arthritis, chronic pain, amputation, death.  All benefits and realistic expectations discussed in great detail.  I have made no promises as to the outcome.  I have provided realistic expectations.  I have offered the patient a 2nd opinion, which they have declined and assured me they preferred to proceed despite the risks   Procedure in detail: The patient was both verbally and visually  identified by myself, the nursing staff, and anesthesia staff in the preoperative holding area. They were then transferred to the operating room and placed on the operative table in supine position.  Attention was directed to the left hallux, a skin marker was used to delineated fishmouth style incision.  Using #15 blade the incision was carried down from the epidermal dermal junction down to the level of the bone.  The toe was disarticulated at the metatarsophalangeal joint.  At this time it was also determined that patient will need to have the head of the metatarsal resected for primary closure.  At this time is also important note that patient did not have any purulent drainage.  The wound appeared clear of infection.  The bone was friable and consistent with osteomyelitic changes.  The wound was thoroughly irrigated with normal saline solution in standard technique using pulse lavage.  Using sagittal saw the head of the first metatarsal was resected in standard technique.  This was sent to pathology as clean margin.  At this time the wound appeared clear of infection and it was determined that patient will benefit from proper primary closure.  Using 3-0 Prolene simple interrupted suture technique was utilized to close the wound.  The wound was dressed with Betadine wet-to-dry dressing followed by Kerlix and Ace bandage.  All bony prominences were adequately padded.  He will be partial weightbearing to the heel discharged on p.o. antibiotics based on culture.  I will see him in clinic 1 week from discharge.  At the conclusion of the procedure the patient was awoken from anesthesia and found to have tolerated the procedure well any complications. There were transferred to PACU with vital  signs stable and vascular status intact.  Boneta Lucks, DPM

## 2020-12-27 NOTE — Interval H&P Note (Signed)
History and Physical Interval Note:  12/27/2020 11:56 AM  [name]  has presented today for surgery, with the diagnosis of foot ulcer diabetic.  The various methods of treatment have been discussed with the patient and family. After consideration of risks, benefits and other options for treatment, the patient has consented to  Procedure(s): [place] (Left) as a surgical intervention.  The patient's history has been reviewed, patient examined, no change in status, stable for surgery.  I have reviewed the patient's chart and labs.  Questions were answered to the patient's satisfaction.     Felipa Furnace

## 2020-12-27 NOTE — Progress Notes (Signed)
Patient taken to OR vis transport. OK per MD to be off monitoring during transport.

## 2020-12-27 NOTE — Progress Notes (Signed)
PROGRESS NOTE                                                                                                                                                                                                             Patient Demographics:    [name], is a 65 y.o. male, DOB - 1956/04/03, GYF:[id number]  Outpatient Primary MD for the patient is Garth Bigness (Inactive)   Admit date - 12/24/2020   LOS - 3  Chief Complaint  Patient presents with   diabetic foot ulcer       Brief Narrative: Patient is a 65 y.o. male with PMHx of insulin-dependent DM-2, CKD stage IIIb, HTN, COPD with chronic hypoxic respiratory failure on 6 L of home O2-presented with left diabetic foot.  See below for further details.  COVID-19 vaccinated status: Vaccinated  Significant Events: 5/28>> COVID 19 positive 6/7>> Admit to [facility] for left diabetic foot  Significant studies: 6/7>> x-ray left foot: No definite evidence of osteomyelitis. 6/7>> chest x-ray: No pneumonia. 6/8>> MRI left foot: Osteomyelitis at the distal tuft of great toe  COVID-19 medications: None  Antibiotics: Ancef: 6/7>>  Microbiology data: 6/7 >>blood culture: No growth 6/7>> urine culture: Insignificant growth  Procedures: None  Consults: Podiatry  DVT prophylaxis: enoxaparin (LOVENOX) injection 40 mg Start: 12/25/20 1000    Subjective:   No major issues overnight-denies any chest pain or shortness of breath.  For to amputation today.   Assessment  & Plan :   Left diabetic foot with acute osteomyelitis of great toe: Remains on IV Ancef-culture data negative so far-podiatry following-for toe amputation later today.   COVID-19 infection: Per patient-diagnosed around 5/28-completed treatment with Paxlovid-currently feels that he is back to his baseline-has chronic cough and is on 6 L of oxygen chronically.  Has completed 10 days of  isolation-suspect does not need any further isolation at this point.  Insulin-dependent DM-2 with uncontrolled hyperglycemia: CBGs remain uncontrolled-for now continue with Humulin R 570 units 3 times daily-currently he is n.p.o.-once he is back from the OR-we can better consider further adjustment of his insulin regimen.    Recent Labs    12/26/20 2232 12/27/20 0337 12/27/20 0751  GLUCAP 439* 356* 284*    CKD stage IIIb: Creatinine close to baseline-follow.  Hyperkalemia: Resolved-treated with Lokelma.  Diabetic polyneuropathy: At baseline  History of CAD-s/p PCI in 2015: No anginal symptoms-continue Plavix, statin, Imdur  COPD with chronic hypoxic respiratory failure: On 6 L of oxygen at baseline.  Stable-no wheezing-stop Decadron.  Continue bronchodilators  BPH: Continue Flomax  GERD: Continue PPI  Obesity: Estimated body mass index is 34.55 kg/m as calculated from the following:   Height as of this encounter: 5\' 8"  (1.727 m).   Weight as of this encounter: 103.1 kg.    ABG:    Component Value Date/Time   PHART 7.410 06/12/2019 1350   PCO2ART 45.0 06/12/2019 1350   PO2ART 93.1 06/12/2019 1350   HCO3 28.0 06/12/2019 1350   TCO2 25 11/07/2018 1717   ACIDBASEDEF 2.0 11/07/2018 1717   O2SAT 97.6 06/12/2019 1350    Vent Settings: N/A    Condition - Stable  Family Communication  : None at bedside-we will reach out to family over the next few days.  Code Status :  Full Code  Diet :  Diet Order             Diet NPO time specified  Diet effective now                    Disposition Plan  :   Status is: Inpatient  Remains inpatient appropriate because:Inpatient level of care appropriate due to severity of illness   Dispo:  Patient From: Home  Planned Disposition: Home  Medically stable for discharge: No     Barriers to discharge: Left diabetic foot-on IV antibiotics-amputation scheduled for 6/10.  Antimicorbials  :    Anti-infectives (From  admission, onward)    Start     Dose/Rate Route Frequency Ordered Stop   12/26/20 1000  remdesivir 100 mg in sodium chloride 0.9 % 100 mL IVPB  Status:  Discontinued       See Hyperspace for full Linked Orders Report.   100 mg 200 mL/hr over 30 Minutes Intravenous Daily 12/24/20 2157 12/24/20 2238   12/25/20 0000  remdesivir 200 mg in sodium chloride 0.9% 250 mL IVPB  Status:  Discontinued       See Hyperspace for full Linked Orders Report.   200 mg 580 mL/hr over 30 Minutes Intravenous Once 12/24/20 2157 12/24/20 2238   12/24/20 2230  ceFAZolin (ANCEF) IVPB 2g/100 mL premix        2 g 200 mL/hr over 30 Minutes Intravenous Every 8 hours 12/24/20 2131     12/24/20 1715  vancomycin (VANCOCIN) IVPB 1000 mg/200 mL premix       See Hyperspace for full Linked Orders Report.   1,000 mg 200 mL/hr over 60 Minutes Intravenous  Once 12/24/20 1545 12/24/20 1920   12/24/20 1545  vancomycin (VANCOCIN) IVPB 1000 mg/200 mL premix  Status:  Discontinued        1,000 mg 200 mL/hr over 60 Minutes Intravenous  Once 12/24/20 1537 12/24/20 1545   12/24/20 1545  vancomycin (VANCOCIN) IVPB 1000 mg/200 mL premix       See Hyperspace for full Linked Orders Report.   1,000 mg 200 mL/hr over 60 Minutes Intravenous  Once 12/24/20 1545 12/24/20 1735       Inpatient Medications  Scheduled Meds:  acetaminophen  1,000 mg Oral Once   vitamin C  500 mg Oral Daily   atorvastatin  20 mg Oral QHS   cholecalciferol  1,000 Units Oral Daily   clopidogrel  75 mg Oral Daily   enoxaparin (LOVENOX) injection  40 mg Subcutaneous Z30Q   folic  acid  1 mg Oral Daily   gabapentin  300 mg Oral BID   insulin aspart  0-9 Units Subcutaneous Q4H   insulin regular human CONCENTRATED  70 Units Subcutaneous TID WC   isosorbide mononitrate  60 mg Oral Daily   mometasone-formoterol  2 puff Inhalation BID   multivitamin with minerals  1 tablet Oral Daily   pantoprazole  40 mg Oral Daily   tamsulosin  0.4 mg Oral Daily   thiamine   100 mg Oral Daily   zinc sulfate  220 mg Oral Daily   Continuous Infusions:   ceFAZolin (ANCEF) IV 2 g (12/27/20 0640)   PRN Meds:.ALPRAZolam, diphenhydrAMINE, guaiFENesin-dextromethorphan, zolpidem   Time Spent in minutes  35   See all Orders from today for further details   Oren Binet M.D on 12/27/2020 at 10:59 AM  To page go to [email or web address] - use universal password  [facility] -  Office  [phone]    Objective:   Vitals:   12/26/20 1900 12/27/20 0500 12/27/20 0634 12/27/20 0635  BP: (!) 146/64 (!) 180/73 (!) 179/97   Pulse: 80 79 83   Resp: 20 14 18    Temp: 98 F (36.7 C) 97.9 F (36.6 C)    TempSrc: Oral Oral    SpO2: 95% 95% 97%   Weight:    103.1 kg  Height:        Wt Readings from Last 3 Encounters:  12/27/20 103.1 kg  06/10/20 103.4 kg  04/18/20 105.4 kg     Intake/Output Summary (Last 24 hours) at 12/27/2020 1059 Last data filed at 12/27/2020 0640 Gross per 24 hour  Intake 900 ml  Output 400 ml  Net 500 ml      Physical Exam Gen Exam:Alert awake-not in any distress HEENT:atraumatic, normocephalic Chest: B/L clear to auscultation anteriorly CVS:S1S2 regular Abdomen:soft non tender, non distended Extremities:no edema Neurology: Non focal Skin: no rash    Data Review:    CBC Recent Labs  Lab 12/24/20 1545 12/25/20 0408 12/26/20 0115 12/27/20 0223  WBC 12.6* 9.2 10.2 7.2  HGB 13.0 12.2* 11.3* 11.4*  HCT 38.8* 36.8* 33.3* 34.2*  PLT 213 192 209 204  MCV 80.8 82.5 80.4 82.2  MCH 27.1 27.4 27.3 27.4  MCHC 33.5 33.2 33.9 33.3  RDW 13.8 13.6 13.2 13.4  LYMPHSABS 1.1 0.6* 0.8 1.1  MONOABS 0.8 0.2 0.6 0.4  EOSABS 0.0 0.0 0.0 0.0  BASOSABS 0.0 0.0 0.0 0.0     Chemistries  Recent Labs  Lab 12/24/20 1545 12/25/20 0408 12/26/20 0115 12/26/20 2336 12/27/20 0223  NA 130* 132* 135  --  132*  K 3.8 5.7* 3.6  --  4.1  CL 95* 100 101  --  99  CO2 22 21* 24  --  23  GLUCOSE 238* 423* 202* 479* 427*  BUN 24* 26* 20   --  19  CREATININE 1.81* 1.57* 1.36*  --  1.40*  CALCIUM 9.1 8.3* 8.6*  --  8.4*  MG  --  1.3*  --   --   --   AST 7* 18 11*  --  10*  ALT 8 12 10   --  8  ALKPHOS 81 76 58  --  72  BILITOT 0.7 1.7* 0.3  --  0.3    ------------------------------------------------------------------------------------------------------------------ No results for input(s): CHOL, HDL, LDLCALC, TRIG, CHOLHDL, LDLDIRECT in the last 72 hours.  Lab Results  Component Value Date   HGBA1C 11.7 (H) 12/25/2020   ------------------------------------------------------------------------------------------------------------------ No results  for input(s): TSH, T4TOTAL, T3FREE, THYROIDAB in the last 72 hours.  Invalid input(s): FREET3 ------------------------------------------------------------------------------------------------------------------ Recent Labs    12/25/20 0408  FERRITIN 288     Coagulation profile Recent Labs  Lab 12/24/20 1545  INR 1.1     Recent Labs    12/25/20 0408  DDIMER 0.93*     Cardiac Enzymes No results for input(s): CKMB, TROPONINI, MYOGLOBIN in the last 168 hours.  Invalid input(s): CK ------------------------------------------------------------------------------------------------------------------    Component Value Date/Time   BNP 84.2 04/12/2020 1348    Micro Results Recent Results (from the past 240 hour(s))  Blood Culture (routine x 2)     Status: None (Preliminary result)   Collection Time: 12/24/20  3:50 PM   Specimen: BLOOD  Result Value Ref Range Status   Specimen Description BLOOD LEFT ANTECUBITAL  Final   Special Requests   Final    BOTTLES DRAWN AEROBIC AND ANAEROBIC Blood Culture adequate volume   Culture   Final    NO GROWTH 3 DAYS Performed at [facility], [address], [place], [place] [address]    Report Status PENDING  Incomplete  Resp Panel by RT-PCR (Flu A&B, Covid)     Status: Abnormal   Collection Time: 12/24/20  4:00 PM    Specimen: Nasopharyngeal(NP) swabs in vial transport medium  Result Value Ref Range Status   SARS Coronavirus 2 by RT PCR POSITIVE (A) NEGATIVE Final    Comment: RESULT CALLED TO, READ BACK BY AND VERIFIED WITH: Radene Ou B RN [address] [place] 12/24/20 (NOTE) SARS-CoV-2 target nucleic acids are DETECTED.  The SARS-CoV-2 RNA is generally detectable in upper respiratory specimens during the acute phase of infection. Positive results are indicative of the presence of the identified virus, but do not rule out bacterial infection or co-infection with other pathogens not detected by the test. Clinical correlation with patient history and other diagnostic information is necessary to determine patient infection status. The expected result is Negative.  Fact Sheet for Patients: EntrepreneurPulse.com.au  Fact Sheet for Healthcare Providers: IncredibleEmployment.be  This test is not yet approved or cleared by the [place] FDA and  has been authorized for detection and/or diagnosis of SARS-CoV-2 by FDA under an Emergency Use Authorization (EUA).  This EUA will remain in effect (meaning this test can be used)  for the duration of  the COVID-19 declaration under Section 564(b)(1) of the Act, 21 U.S.C. section 360bbb-3(b)(1), unless the authorization is terminated or revoked sooner.     Influenza A by PCR NEGATIVE NEGATIVE Final   Influenza B by PCR NEGATIVE NEGATIVE Final    Comment: (NOTE) The Xpert Xpress SARS-CoV-2/FLU/RSV plus assay is intended as an aid in the diagnosis of influenza from Nasopharyngeal swab specimens and should not be used as a sole basis for treatment. Nasal washings and aspirates are unacceptable for Xpert Xpress SARS-CoV-2/FLU/RSV testing.  Fact Sheet for Patients: EntrepreneurPulse.com.au  Fact Sheet for Healthcare Providers: IncredibleEmployment.be  This test is not yet approved or cleared by the  [place] FDA and has been authorized for detection and/or diagnosis of SARS-CoV-2 by FDA under an Emergency Use Authorization (EUA). This EUA will remain in effect (meaning this test can be used) for the duration of the COVID-19 declaration under Section 564(b)(1) of the Act, 21 U.S.C. section 360bbb-3(b)(1), unless the authorization is terminated or revoked.  Performed at [facility], [address] [place], [place] [address]   Urine culture     Status: Abnormal   Collection Time:  12/24/20  8:08 PM   Specimen: In/Out Cath Urine  Result Value Ref Range Status   Specimen Description   Final    IN/OUT CATH URINE Performed at [facility], [address], [place], [place] [address]    Special Requests   Final    NONE Performed at [facility], [address], [place], [place] [address]    Culture (A)  Final    <10,000 COLONIES/mL INSIGNIFICANT GROWTH Performed at [facility] [address], [place], [place] [address]    Report Status 12/26/2020 FINAL  Final  MRSA PCR Screening     Status: None   Collection Time: 12/24/20  9:28 PM   Specimen: Nasopharyngeal  Result Value Ref Range Status   MRSA by PCR NEGATIVE NEGATIVE Final    Comment:        The GeneXpert MRSA Assay (FDA approved for NASAL specimens only), is one component of a comprehensive MRSA colonization surveillance program. It is not intended to diagnose MRSA infection nor to guide or monitor treatment for MRSA infections. Performed at [facility], [place] [address] [place], [place] [address]     Radiology Reports MR FOOT LEFT WO CONTRAST  Result Date: 12/25/2020 CLINICAL DATA:  Osteomyelitis, foot osteomyeleitis EXAM: MRI OF THE LEFT FOOT WITHOUT CONTRAST TECHNIQUE: Multiplanar, multisequence MR imaging of the left foot was performed. No intravenous contrast was administered. COMPARISON:  Left foot radiograph 12/24/2020 FINDINGS:  Bones/Joint/Cartilage There is a small area of edema signal and confluent low T1 signal within the tuft of the distal phalanx of the great toe. There is edema signal without confluent low T1 signal in the great toe proximal phalanx. Ligaments Intact Lisfranc ligament.  No evidence of plantar plate tear Muscles and Tendons There is diffuse muscle atrophy throughout the foot. Soft tissues There is a plantar soft tissue ulcer along the great toe. Diffuse soft tissue edema. IMPRESSION: Findings consistent with a small area of osteomyelitis in the distal tuft of the great toe. Mild edema signal within the great toe proximal phalanx which could represent reactive marrow change or early osteomyelitis. Plantar soft tissue ulcer noted along the great toe. No evidence of soft tissue abscess. Electronically Signed   By: Maurine Simmering   On: 12/25/2020 18:58   DG Chest Portable 1 View  Result Date: 12/24/2020 CLINICAL DATA:  Questionable sepsis, diabetes mellitus, COPD EXAM: PORTABLE CHEST 1 VIEW COMPARISON:  Portable exam 1600 hours compared to 04/12/2020 FINDINGS: Enlargement of cardiac silhouette. Mediastinal contours and pulmonary vascularity normal. Atherosclerotic calcification aorta. Parenchymal scarring at the mid to inferior LEFT chest. Bibasilar atelectasis. Lungs otherwise clear. No definite acute infiltrate, pleural effusion, or pneumothorax. Osseous structures unremarkable. IMPRESSION: Bibasilar atelectasis and chronic LEFT basilar scarring. Enlargement of cardiac silhouette. Aortic Atherosclerosis (ICD10-I70.0). Electronically Signed   By: Lavonia Dana M.D.   On: 12/24/2020 16:18   DG Foot Complete Left  Result Date: 12/24/2020 CLINICAL DATA:  Plantar ulceration great toe, diabetes mellitus, question osteomyelitis EXAM: LEFT FOOT - COMPLETE 3+ VIEW COMPARISON:  05/25/2005 FINDINGS: Osseous mineralization decreased. Joint spaces preserved. No acute fracture or dislocation. Ulcer identified at plantar aspect of  LEFT foot at the level of the proximal phalanx. Small soft tissue calcification versus radiopaque foreign body at ulcer 1.2 mm diameter. No bone destruction to suggest osteomyelitis. IMPRESSION: No definite evidence of osteomyelitis. If this remains a clinical concern, consider MR assessment. Electronically Signed   By: Lavonia Dana M.D.   On: 12/24/2020  16:21   VAS [place] ABI WITH/WO TBI  Result Date: 12/26/2020  LOWER EXTREMITY DOPPLER STUDY Patient Name:  KARO ROG  Date of Exam:   12/26/2020 Medical Rec #: [id number]       Accession #:    [id number] Date of Birth: 05-09-1956      Patient Gender: M Patient Age:   64Y Exam Location:  [facility] Procedure:      VAS [place] ABI WITH/WO TBI Referring Phys: [id number] [place] --------------------------------------------------------------------------------  Indications: Parasthesia. High Risk Factors: Hypertension, Diabetes.  Limitations: Today's exam was limited due to an open wound, involuntary patient              movement and bandages. Comparison Study: No prior studies. Performing Technologist: Carlos Levering RVT  Examination Guidelines: A complete evaluation includes at minimum, Doppler waveform signals and systolic blood pressure reading at the level of bilateral brachial, anterior tibial, and posterior tibial arteries, when vessel segments are accessible. Bilateral testing is considered an integral part of a complete examination. Photoelectric Plethysmograph (PPG) waveforms and toe systolic pressure readings are included as required and additional duplex testing as needed. Limited examinations for reoccurring indications may be performed as noted.  ABI Findings: +---------+------------------+-----+---------+--------+ Right    Rt Pressure (mmHg)IndexWaveform Comment  +---------+------------------+-----+---------+--------+ Brachial 153                    triphasic         +---------+------------------+-----+---------+--------+ PTA      120                0.78 biphasic          +---------+------------------+-----+---------+--------+ DP       125               0.82 biphasic          +---------+------------------+-----+---------+--------+ Great Toe95                0.62                   +---------+------------------+-----+---------+--------+ +--------+------------------+-----+----------+-------+ Left    Lt Pressure (mmHg)IndexWaveform  Comment +--------+------------------+-----+----------+-------+ [id number]                    biphasic          +--------+------------------+-----+----------+-------+ PTA     117               0.76 monophasic        +--------+------------------+-----+----------+-------+ DP      99                0.65 monophasic        +--------+------------------+-----+----------+-------+ +-------+-----------+-----------+------------+------------+ ABI/TBIToday's ABIToday's TBIPrevious ABIPrevious TBI +-------+-----------+-----------+------------+------------+ Right  0.82       0.62                                +-------+-----------+-----------+------------+------------+ Left   0.76                                           +-------+-----------+-----------+------------+------------+  Summary: Right: Resting right ankle-brachial index indicates mild right lower extremity arterial disease. The right toe-brachial index is abnormal. Left: Resting left ankle-brachial index indicates moderate left lower extremity arterial disease. Unable to obtain TBI due to open wounds.  *See  table(s) above for measurements and observations.  Electronically signed by Monica Martinez MD on 12/26/2020 at 7:18:50 PM.    Final    ECHOCARDIOGRAM COMPLETE  Result Date: 12/06/2020    ECHOCARDIOGRAM REPORT   Patient Name:   WILMON CONOVER [facility] Date of Exam: 12/06/2020 Medical Rec #:  [id number]      Height:       66.0 in Accession #:    [id number]     Weight:       228.0 lb Date of Birth:  Nov 13, 1955     BSA:           2.114 m Patient Age:    77 years       BP:           87/52 mmHg Patient Gender: M              HR:           91 bpm. Exam Location:  Outpatient Procedure: 2D Echo, Cardiac Doppler and Color Doppler Indications:    I27.20 Pulmonary Hypertension  History:        Patient has prior history of Echocardiogram examinations, most                 recent 07/05/2015. CAD, Stroke and COPD; Risk                 Factors:Hypertension, Diabetes, Dyslipidemia, Former Smoker and                 Sleep Apnea. Cocaine abuse. GERD.  Sonographer:    Tiffany Dance Referring Phys: [id number] [place]  1. Left ventricular ejection fraction, by estimation, is 55 to 60%. The left ventricle has normal function. The left ventricle has no regional wall motion abnormalities. There is mild left ventricular hypertrophy. Left ventricular diastolic parameters are consistent with Grade I diastolic dysfunction (impaired relaxation).  2. Right ventricular systolic function is normal. The right ventricular size is normal. Tricuspid regurgitation signal is inadequate for assessing PA pressure.  3. Left atrial size was mildly dilated.  4. The mitral valve is grossly normal. Trivial mitral valve regurgitation.  5. The aortic valve is tricuspid. Aortic valve regurgitation is not visualized.  6. Mildly dilated pulmonary artery.  7. The inferior vena cava is dilated in size with >50% respiratory variability, suggesting right atrial pressure of 8 mmHg. Comparison(s): Prior images unable to be directly viewed, comparison made by report only. Changes from prior study are noted. 07/05/2015: LVEF 60-65%. FINDINGS  Left Ventricle: Left ventricular ejection fraction, by estimation, is 55 to 60%. The left ventricle has normal function. The left ventricle has no regional wall motion abnormalities. The left ventricular internal cavity size was normal in size. There is  mild left ventricular hypertrophy. Left ventricular diastolic parameters are  consistent with Grade I diastolic dysfunction (impaired relaxation). Indeterminate filling pressures. Right Ventricle: The right ventricular size is normal. No increase in right ventricular wall thickness. Right ventricular systolic function is normal. Tricuspid regurgitation signal is inadequate for assessing PA pressure. Left Atrium: Left atrial size was mildly dilated. Right Atrium: Right atrial size was normal in size. Pericardium: There is no evidence of pericardial effusion. Mitral Valve: The mitral valve is grossly normal. Trivial mitral valve regurgitation. Tricuspid Valve: The tricuspid valve is grossly normal. Tricuspid valve regurgitation is trivial. Aortic Valve: The aortic valve is tricuspid. Aortic valve regurgitation is not visualized. Pulmonic Valve: The pulmonic valve was grossly normal. Pulmonic valve regurgitation is trivial.  Aorta: The aortic root and ascending aorta are structurally normal, with no evidence of dilitation. Pulmonary Artery: The pulmonary artery is mildly dilated. Venous: The inferior vena cava is dilated in size with greater than 50% respiratory variability, suggesting right atrial pressure of 8 mmHg. IAS/Shunts: No atrial level shunt detected by color flow Doppler.  LEFT VENTRICLE PLAX 2D LVIDd:         4.90 cm LVIDs:         3.40 cm LV PW:         1.50 cm LV IVS:        1.10 cm LVOT diam:     2.10 cm LV SV:         73 LV SV Index:   35 LVOT Area:     3.46 cm  RIGHT VENTRICLE             IVC RV Basal diam:  3.00 cm     IVC diam: 2.50 cm RV Mid diam:    2.20 cm RV S prime:     11.40 cm/s TAPSE (M-mode): 1.6 cm LEFT ATRIUM           Index       RIGHT ATRIUM           Index LA diam:      3.90 cm 1.84 cm/m  RA Area:     11.40 cm LA Vol (A2C): 38.5 ml 18.21 ml/m RA Volume:   23.70 ml  11.21 ml/m LA Vol (A4C): 67.4 ml 31.88 ml/m  AORTIC VALVE LVOT Vmax:   115.67 cm/s LVOT Vmean:  78.900 cm/s LVOT VTI:    0.211 m  AORTA Ao Root diam: 3.30 cm Ao Asc diam:  3.40 cm MITRAL VALVE MV  Area (PHT): 2.73 cm    SHUNTS MV Decel Time: 278 msec    Systemic VTI:  0.21 m MV E velocity: 56.30 cm/s  Systemic Diam: 2.10 cm MV A velocity: 80.40 cm/s MV E/A ratio:  0.70 Lyman Bishop MD Electronically signed by Lyman Bishop MD Signature Date/Time: 12/06/2020/3:49:44 PM    Final

## 2020-12-27 NOTE — Progress Notes (Signed)
Echo was inadequate for assessing PA pressure, mildly dilated pulmonary artery. Grade 1 diastolic dysfunction. Needs follow up with MR

## 2020-12-27 NOTE — Anesthesia Procedure Notes (Signed)
Procedure Name: MAC Date/Time: 12/27/2020 12:25 PM Performed by: Kathryne Hitch, CRNA Pre-anesthesia Checklist: Patient identified, Emergency Drugs available, Suction available and Patient being monitored Patient Re-evaluated:Patient Re-evaluated prior to induction Oxygen Delivery Method: Simple face mask Preoxygenation: Pre-oxygenation with 100% oxygen Induction Type: IV induction Placement Confirmation: positive ETCO2 Dental Injury: Teeth and Oropharynx as per pre-operative assessment

## 2020-12-27 NOTE — Anesthesia Postprocedure Evaluation (Signed)
Anesthesia Post Note  Patient: [name]  Procedure(s) Performed: PARTIAL FIRST RAY AMPUTATION OF FOOT (Left: Foot)     Patient location during evaluation: PACU Anesthesia Type: MAC Level of consciousness: awake and alert Pain management: pain level controlled Vital Signs Assessment: post-procedure vital signs reviewed and stable Respiratory status: spontaneous breathing, nonlabored ventilation and respiratory function stable Cardiovascular status: blood pressure returned to baseline and stable Postop Assessment: no apparent nausea or vomiting Anesthetic complications: no   No notable events documented.  Last Vitals:  Vitals:   12/27/20 1343 12/27/20 1457  BP: (!) 169/68 (!) 162/64  Pulse: 82 75  Resp: (!) 22 20  Temp: 36.7 C   SpO2: 95% 96%    Last Pain:  Vitals:   12/27/20 1343  TempSrc: Oral  PainSc:                  Pervis Hocking

## 2020-12-27 NOTE — Progress Notes (Signed)
Patient returned from OR.  Awake and alert.  Monitors in place. Vitals taken.

## 2020-12-28 DIAGNOSIS — E1151 Type 2 diabetes mellitus with diabetic peripheral angiopathy without gangrene: Secondary | ICD-10-CM

## 2020-12-28 LAB — MAGNESIUM: Magnesium: 1.8 mg/dL (ref 1.7–2.4)

## 2020-12-28 LAB — CBC WITH DIFFERENTIAL/PLATELET
Abs Immature Granulocytes: 0.06 10*3/uL (ref 0.00–0.07)
Basophils Absolute: 0 10*3/uL (ref 0.0–0.1)
Basophils Relative: 0 %
Eosinophils Absolute: 0 10*3/uL (ref 0.0–0.5)
Eosinophils Relative: 0 %
HCT: 33 % — ABNORMAL LOW (ref 39.0–52.0)
Hemoglobin: 11 g/dL — ABNORMAL LOW (ref 13.0–17.0)
Immature Granulocytes: 1 %
Lymphocytes Relative: 18 %
Lymphs Abs: 1.2 10*3/uL (ref 0.7–4.0)
MCH: 27.4 pg (ref 26.0–34.0)
MCHC: 33.3 g/dL (ref 30.0–36.0)
MCV: 82.1 fL (ref 80.0–100.0)
Monocytes Absolute: 0.6 10*3/uL (ref 0.1–1.0)
Monocytes Relative: 9 %
Neutro Abs: 4.9 10*3/uL (ref 1.7–7.7)
Neutrophils Relative %: 72 %
Platelets: 192 10*3/uL (ref 150–400)
RBC: 4.02 MIL/uL — ABNORMAL LOW (ref 4.22–5.81)
RDW: 13.4 % (ref 11.5–15.5)
WBC: 6.8 10*3/uL (ref 4.0–10.5)
nRBC: 0 % (ref 0.0–0.2)

## 2020-12-28 LAB — COMPREHENSIVE METABOLIC PANEL
ALT: 5 U/L (ref 0–44)
AST: 11 U/L — ABNORMAL LOW (ref 15–41)
Albumin: 2.4 g/dL — ABNORMAL LOW (ref 3.5–5.0)
Alkaline Phosphatase: 56 U/L (ref 38–126)
Anion gap: 7 (ref 5–15)
BUN: 18 mg/dL (ref 8–23)
CO2: 27 mmol/L (ref 22–32)
Calcium: 8.6 mg/dL — ABNORMAL LOW (ref 8.9–10.3)
Chloride: 100 mmol/L (ref 98–111)
Creatinine, Ser: 1.12 mg/dL (ref 0.61–1.24)
GFR, Estimated: >60 mL/min (ref >60)
Glucose, Bld: 263 mg/dL — ABNORMAL HIGH (ref 70–99)
Potassium: 4.3 mmol/L (ref 3.5–5.1)
Sodium: 134 mmol/L — ABNORMAL LOW (ref 135–145)
Total Bilirubin: 0.4 mg/dL (ref 0.3–1.2)
Total Protein: 5.9 g/dL — ABNORMAL LOW (ref 6.5–8.1)

## 2020-12-28 LAB — GLUCOSE, CAPILLARY
Glucose-Capillary: 241 mg/dL — ABNORMAL HIGH (ref 70–99)
Glucose-Capillary: 225 mg/dL — ABNORMAL HIGH (ref 70–99)

## 2020-12-28 MED ORDER — NITROGLYCERIN 0.4 MG SL SUBL: 0.4000 mg | SUBLINGUAL_TABLET | SUBLINGUAL | Status: DC | PRN

## 2020-12-28 MED ORDER — HYDROCODONE-ACETAMINOPHEN 5-325 MG PO TABS
1.0000 | ORAL_TABLET | Freq: Four times a day (QID) | ORAL | Status: DC | PRN
  Administered 2020-12-28: 1 via ORAL
  Filled 2020-12-28: qty 1

## 2020-12-28 MED ORDER — OXYCODONE-ACETAMINOPHEN 10-325 MG PO TABS: 1.0000 | ORAL_TABLET | Freq: Four times a day (QID) | ORAL | 0 refills | Status: DC | PRN

## 2020-12-28 MED ORDER — DOXYCYCLINE HYCLATE 100 MG PO TABS: 100.0000 mg | ORAL_TABLET | Freq: Two times a day (BID) | ORAL | 0 refills | Status: DC

## 2020-12-28 MED ORDER — INSULIN REGULAR HUMAN (CONC) 500 UNIT/ML ~~LOC~~ SOPN: 80.0000 [IU] | PEN_INJECTOR | Freq: Three times a day (TID) | SUBCUTANEOUS | Status: DC

## 2020-12-28 MED ORDER — ISOSORBIDE MONONITRATE ER 60 MG PO TB24: 60.0000 mg | ORAL_TABLET | Freq: Every day | ORAL | Status: DC

## 2020-12-28 NOTE — Progress Notes (Signed)
Orthopedic Tech Progress Note Patient Details:  [name] [birth year] [id number]  Ortho Devices Type of Ortho Device: Darco shoe Ortho Device/Splint Location: LLE Ortho Device/Splint Interventions: Application, Ordered   Post Interventions Patient Tolerated: Well Instructions Provided: Adjustment of device  Sibel Khurana A Rayvin Abid 12/28/2020, 11:30 AM

## 2020-12-28 NOTE — Evaluation (Signed)
Occupational Therapy Evaluation Patient Details Name: [name] MRN: [id number] DOB: 10/27/1955 Today's Date: 12/28/2020    History of Present Illness Patient is a 65 y.o. male with PMHx of insulin-dependent DM-2, CKD stage IIIb, HTN, COPD with chronic hypoxic respiratory failure on 6 L of home O2-presented with left diabetic foot. S/p partial first ray amputation 6/10.   Clinical Impression   PTA, pt was living at home, pt reports his mom lives with him, pt had assistance with dressing and bathing from PCA who is there M-F for 4hours. Otherwise, pt reports he was independent with ADL and functional mobility with intermittent use of a cane. Pt currently s/p partial first ray amputation. He is PWB through L heel, no wedge shoe was present in pt's room at the time of this session, therefor pt kept NWB through LLE. Pt able to maintain WB status with sit<>stand and stand-pivot transfers x2 at [facility] level. He completed LB dressing while sitting EOB and reports he is at his baseline with self-care. Pt reports he has a w/c, RW, BSC at home. Patient evaluated by Occupational Therapy with no further acute OT needs identified. All education has been completed and the patient has no further questions. See below for any follow-up Occupational Therapy or equipment needs. OT to sign off. Thank you for referral.        Follow Up Recommendations  No OT follow up    Equipment Recommendations  None recommended by OT    Recommendations for Other Services       Precautions / Restrictions Precautions Precautions: Fall Precaution Comments: PWB through heel L foot Required Braces or Orthoses: Other Brace Other Brace: wedge shoe Restrictions Weight Bearing Restrictions: No      Mobility Bed Mobility               General bed mobility comments: pt sitting EOB upon arrival    Transfers Overall transfer level: Needs assistance Equipment used: Rolling walker (2 wheeled) Transfers: Sit to/from  [facility] Sit to Stand: Supervision Stand pivot transfers: Supervision       General transfer comment: supervision with cues for safe hand placement, pt completed stand pivot transfer with supervision while maintaining NWB to LLE due to no wedge shoe present in room.    Balance Overall balance assessment: Mild deficits observed, not formally tested                                         ADL either performed or assessed with clinical judgement   ADL Overall ADL's : At baseline                                       General ADL Comments: pt demonstrated ability to don LB clothing while sitting EOB, supervision for stand pivot transfer, pt functioning close to baseline level with good maintenance of PWB status     Vision Baseline Vision/History: No visual deficits Patient Visual Report: No change from baseline       Perception     Praxis      Pertinent Vitals/Pain Pain Assessment: 0-10 Pain Score: 5  Pain Location: incision site Pain Descriptors / Indicators: Sore;Throbbing Pain Intervention(s): Limited activity within patient's tolerance;Monitored during session     Hand Dominance Right   Extremity/Trunk Assessment Upper  Extremity Assessment Upper Extremity Assessment: Overall WFL for tasks assessed   Lower Extremity Assessment Lower Extremity Assessment: Overall WFL for tasks assessed;LLE deficits/detail LLE Deficits / Details: s/p partial first ray amputation   Cervical / Trunk Assessment Cervical / Trunk Assessment: Normal   Communication Communication Communication: No difficulties   Cognition Arousal/Alertness: Awake/alert Behavior During Therapy: WFL for tasks assessed/performed Overall Cognitive Status: History of cognitive impairments - at baseline                                 General Comments: has minor memory limitations at baseline, otherwise [facility] for tasks assessed   General  Comments  SpO2 92%-96% on 5lnc;    Exercises     Shoulder Instructions      Home Living Family/patient expects to be discharged to:: Private residence Living Arrangements: Parent Available Help at Discharge: Personal care attendant (M-F 4 hours/day) Type of Home: Mobile home Home Access: Ramped entrance     Home Layout: One level     Bathroom Shower/Tub: Occupational psychologist: Handicapped height Bathroom Accessibility: Yes How Accessible: Accessible via walker [facility] bed;Wheelchair - [employer] - 2 wheels;Walker - 4 wheels;Bedside commode;Shower seat   Additional Comments: pt's mom lives with him and his brother lives behind him;aide assists pt with dressing, medications, showers      Prior Functioning/Environment Level of Independence: Needs assistance  Gait / Transfers Assistance Needed: cane at times otherwise independent with ambulation ADL's / Homemaking Assistance Needed: aid assists with bathing, dressing;pt on 6lnc at baseline   Comments: still drives short distances        OT Problem List: Decreased activity tolerance;Impaired balance (sitting and/or standing);Pain      OT Treatment/Interventions:      OT Goals(Current goals can be found in the care plan section) Acute Rehab OT Goals Patient Stated Goal: to go home today OT Goal Formulation: With patient Time For Goal Achievement: 01/11/21 Potential to Achieve Goals: Good  OT Frequency:     Barriers to D/C:            Co-evaluation              AM-PAC OT "6 Clicks" Daily Activity     Outcome Measure Help from another person eating meals?: None Help from another person taking care of personal grooming?: A Little Help from another person toileting, which includes using toliet, bedpan, or urinal?: A Little Help from another person bathing (including washing, rinsing, drying)?: A Little Help from another person to put on and taking off regular upper body  clothing?: None Help from another person to put on and taking off regular lower body clothing?: A Little 6 Click Score: 20   End of Session Equipment Utilized During Treatment: Gait belt;Rolling walker;Oxygen Nurse Communication: Mobility status  Activity Tolerance: Patient tolerated treatment well Patient left: in bed;with call bell/phone within reach  OT Visit Diagnosis: Other abnormalities of gait and mobility (R26.89);Pain Pain - Right/Left: Left Pain - part of body: Ankle and joints of foot                Time: 0488-8916 OT Time Calculation (min): 21 min Charges:  OT General Charges $OT Visit: 1 Visit OT Evaluation $OT Eval Low Complexity: [place] OTR/L Acute Rehabilitation Services Office: [place] 12/28/2020, 9:26 AM

## 2020-12-28 NOTE — Progress Notes (Signed)
Completed DC teaching with patient. Verbalized understanding of follow-up appointments, medication regimen and non-weight bearing status. PIV removed and belongings gathered.  Took patient to DC area via wheelchair.

## 2020-12-28 NOTE — Discharge Summary (Addendum)
PATIENT DETAILS Name: [name] Age: 65 y.o. Sex: male Date of Birth: 04-07-1956 MRN: [id number]. Admitting Physician: Kayleen Memos, DO ORV:IFBPPH, Faylene Million, PA-C (Inactive)  Admit Date: 12/24/2020 Discharge date: 12/28/2020  Recommendations for Outpatient Follow-up:  Follow up with PCP in 1-2 weeks Please obtain CMP/CBC in one week Ensure follow-up with podiatry PCP to consider outpatient referral to vascular surgery for underlying PAD   Admitted From:  Home  Disposition: [place]: No  Equipment/Devices: None  Discharge Condition: Stable  CODE STATUS: FULL CODE  Diet recommendation:  Diet Order             Diet - low sodium heart healthy           Diet heart healthy/carb modified Room service appropriate? Yes; Fluid consistency: Thin  Diet effective now                    Brief Narrative: Patient is a 65 y.o. male with PMHx of insulin-dependent DM-2, CKD stage IIIb, HTN, COPD with chronic hypoxic respiratory failure on 6 L of home O2-presented with left diabetic foot.  See below for further details.   COVID-19 vaccinated status: Vaccinated   Significant Events: 5/28>> COVID 19 positive 6/7>> Admit to [facility] for left diabetic foot   Significant studies: 6/7>> x-ray left foot: No definite evidence of osteomyelitis. 6/7>> chest x-ray: No pneumonia. 6/8>> MRI left foot: Osteomyelitis at the distal tuft of great toe   COVID-19 medications: None   Antibiotics: Ancef: 6/7>>6/11   Microbiology data: 6/7 >>blood culture: No growth 6/7>> urine culture: Insignificant growth   Procedures: 6/10>> partial first ray amputation   Consults: [place] [facility] Course: Left diabetic foot with acute osteomyelitis of great toe: Treated with IV Ancef-culture data negative so far-MRI confirmed underlying osteomyelitis-patient underwent partial first ray amputation on 6/10.  Discussed with Dr. Patel-podiatry-today-okay to discharge-he  remains partial weightbearing on his left foot-recommendations are to continue antibiotics in the form of doxycycline on discharge.  Dr. Posey Pronto will follow in his office next week-and adjust antibiotics accordingly.   COVID-19 infection: Per patient-diagnosed around 5/28-completed treatment with Paxlovid-currently feels that he is back to his baseline-has chronic cough and is on 6 L of oxygen chronically.  Has completed 10 days of isolation-suspect does not need any further isolation at this point.   Insulin-dependent DM-2 with uncontrolled hyperglycemia: CBGs were on the higher side-he will resume his usual dosing of Humulin R 500 on discharge.  Follow-up with his primary care practitioner for further optimization.  CKD stage IIIb: Creatinine close to baseline-follow.   Hyperkalemia: Resolved-treated with Lokelma.   Diabetic polyneuropathy: At baseline   History of CAD-s/p PCI in 2015: No anginal symptoms-continue Plavix, statin, Imdur  PAD-does appear to have some amount of PAD based on ABI-PCP to consider outpatient referral to vascular surgery.   COPD with chronic hypoxic respiratory failure: On 6 L of oxygen at baseline.  Stable-no wheezing-continue usual bronchodilator regimen.   BPH: Continue Flomax   GERD: Continue PPI   Obesity: Estimated body mass index is 34.55 kg/m as calculated from the following:   Height as of this encounter: 5\' 8"  (1.727 m).   Weight as of this encounter: 103.1 kg.    Discharge Diagnoses:  Active Problems:   Diabetic foot ulcer ([place])   Discharge Instructions:  Activity:  partial weightbearing to the heel  Discharge Instructions     Call MD for:  redness, tenderness, or signs  of infection (pain, swelling, redness, odor or green/yellow discharge around incision site)   Complete by: As directed    Diet - low sodium heart healthy   Complete by: As directed    Discharge instructions   Complete by: As directed    Follow with Primary MD  [name], PA-C (Inactive) in 1-2 weeks  Please follow-up with Dr. Posey Pronto next week  Please get a complete blood count and chemistry panel checked by your Primary MD at your next visit, and again as instructed by your Primary MD.  Get Medicines reviewed and adjusted: Please take all your medications with you for your next visit with your Primary MD  Laboratory/radiological data: Please request your Primary MD to go over all hospital tests and procedure/radiological results at the follow up, please ask your Primary MD to get all Hospital records sent to his/her office.  In some cases, they will be blood work, cultures and biopsy results pending at the time of your discharge. Please request that your primary care M.D. follows up on these results.  Also Note the following: If you experience worsening of your admission symptoms, develop shortness of breath, life threatening emergency, suicidal or homicidal thoughts you must seek medical attention immediately by calling 911 or calling your MD immediately  if symptoms less severe.  You must read complete instructions/literature along with all the possible adverse reactions/side effects for all the Medicines you take and that have been prescribed to you. Take any new Medicines after you have completely understood and accpet all the possible adverse reactions/side effects.   Do not drive when taking Pain medications or sleeping medications (Benzodaizepines)  Do not take more than prescribed Pain, Sleep and Anxiety Medications. It is not advisable to combine anxiety,sleep and pain medications without talking with your primary care practitioner  Special Instructions: If you have smoked or chewed Tobacco  in the last 2 yrs please stop smoking, stop any regular Alcohol  and or any Recreational drug use.  Wear Seat belts while driving.  Please note: You were cared for by a hospitalist during your hospital stay. Once you are discharged, your primary  care physician will handle any further medical issues. Please note that NO REFILLS for any discharge medications will be authorized once you are discharged, as it is imperative that you return to your primary care physician (or establish a relationship with a primary care physician if you do not have one) for your post hospital discharge needs so that they can reassess your need for medications and monitor your lab values.   Keep dressing in the left foot as is-it will be changed when you see podiatry-Dr. Posey Pronto next week   Increase activity slowly   Complete by: As directed    Leave dressing on - Keep it clean, dry, and intact until clinic visit   Complete by: As directed       Allergies as of 12/28/2020       Reactions   Lisinopril Other (See Comments)   Other reaction(s): worsening kidney function   Oxycodone Itching, Nausea Only   Pt is able to tolerate if taken with benadryl        Medication List     STOP taking these medications    NovoLOG FlexPen 100 UNIT/ML FlexPen Generic drug: insulin aspart   oxyCODONE-acetaminophen 10-325 MG tablet Commonly known as: PERCOCET       TAKE these medications    acetaminophen 325 MG tablet Commonly known as: TYLENOL  Take 650 mg by mouth as needed (pain).   ALPRAZolam 0.5 MG tablet Commonly known as: XANAX Take 1 tablet (0.5 mg total) by mouth 3 (three) times daily as needed for anxiety.   atorvastatin 20 MG tablet Commonly known as: LIPITOR Take 20 mg by mouth at bedtime.   budesonide 1 MG/2ML nebulizer solution Commonly known as: PULMICORT Take 1 mg by nebulization as needed (shortness of breath).   budesonide-formoterol 160-4.5 MCG/ACT inhaler Commonly known as: SYMBICORT Inhale 2 puffs into the lungs 2 (two) times daily.   clopidogrel 75 MG tablet Commonly known as: PLAVIX TAKE ONE TABLET BY MOUTH EVERY DAY   diltiazem 180 MG 24 hr capsule Commonly known as: CARDIZEM CD Take 1 capsule (180 mg total) by mouth  daily.   diphenhydrAMINE 25 mg capsule Commonly known as: BENADRYL Take 25 mg by mouth every 6 (six) hours as needed for itching (take with percocet).   docusate sodium 100 MG capsule Commonly known as: COLACE Take 200 mg by mouth at bedtime.   doxycycline 100 MG tablet Commonly known as: VIBRA-TABS Take 1 tablet (100 mg total) by mouth 2 (two) times daily.   esomeprazole 40 MG capsule Commonly known as: NEXIUM Take 40 mg by mouth at bedtime. Pt only take as needed   FLUoxetine 40 MG capsule Commonly known as: PROZAC Take 40 mg by mouth daily. Take with 10mg  capsule for a total of 50mg  daily.   FLUoxetine 10 MG capsule Commonly known as: PROZAC Take 10 mg by mouth daily. Take with 40mg  capsule for a total of 50mg  daily.   formoterol 20 MCG/2ML nebulizer solution Commonly known as: PERFOROMIST Take 20 mcg by nebulization as needed (shortness of breath).   gabapentin 300 MG capsule Commonly known as: NEURONTIN Take 900 mg by mouth 3 (three) times daily.   Glycopyrrolate-Formoterol 9-4.8 MCG/ACT Aero Commonly known as: Airline pilot Inhale 2 puffs into the lungs 2 (two) times daily.   insulin regular human CONCENTRATED 500 UNIT/ML injection Commonly known as: HUMULIN R Inject 90 Units into the skin 3 (three) times daily with meals.   isosorbide mononitrate 60 MG 24 hr tablet Commonly known as: IMDUR Take 1 tablet (60 mg total) by mouth daily.   linagliptin 5 MG Tabs tablet Commonly known as: TRADJENTA Take 5 mg by mouth daily.   nitroGLYCERIN 0.4 MG SL tablet Commonly known as: NITROSTAT Place 1 tablet (0.4 mg total) under the tongue every 5 (five) minutes as needed for chest pain. What changed: See the new instructions.   oxybutynin 10 MG 24 hr tablet Commonly known as: DITROPAN-XL Take 10 mg by mouth daily.   potassium chloride SA 20 MEQ tablet Commonly known as: KLOR-CON Take 20 mEq by mouth 2 (two) times daily.   pregabalin 25 MG capsule Commonly  known as: LYRICA Take 25 mg by mouth 2 (two) times daily.   PROBIOTIC PO Take 1 capsule by mouth daily.   tamsulosin 0.4 MG Caps capsule Commonly known as: FLOMAX Take 0.4 mg by mouth daily.   torsemide 20 MG tablet Commonly known as: DEMADEX TAKE THREE TABLETS BY MOUTH TWICE DAILY What changed: when to take this   True Metrix Blood Glucose Test test strip Generic drug: glucose blood   zolpidem 5 MG tablet Commonly known as: AMBIEN Take 5 mg by mouth at bedtime. For insomnia.               Discharge Care Instructions  (From admission, onward)  Start     Ordered   12/28/20 0000  Leave dressing on - Keep it clean, dry, and intact until clinic visit        12/28/20 0844            Follow-up Information     [name], PA-C. Schedule an appointment as soon as possible for a visit in 1 week(s).   Specialty: Physician Assistant Why: Hospital follow up Contact information: [address] [place] [address] [phone]         Satira Sark, MD. Call in 1 month(s).   Specialty: Cardiology Contact information: [place] [address] [phone]         Felipa Furnace, DPM. Schedule an appointment as soon as possible for a visit.   Specialty: Podiatry Why: Please follow next week-for wound check/post hospital discharge visit Contact information: [place] [address] [phone]                Allergies  Allergen Reactions   Lisinopril Other (See Comments)    Other reaction(s): worsening kidney function   Oxycodone Itching and Nausea Only    Pt is able to tolerate if taken with benadryl     Other Procedures/Studies: MR FOOT LEFT WO CONTRAST  Result Date: 12/25/2020 CLINICAL DATA:  Osteomyelitis, foot osteomyeleitis EXAM: MRI OF THE LEFT FOOT WITHOUT CONTRAST TECHNIQUE: Multiplanar, multisequence MR imaging of the left foot was performed. No intravenous contrast was  administered. COMPARISON:  Left foot radiograph 12/24/2020 FINDINGS: Bones/Joint/Cartilage There is a small area of edema signal and confluent low T1 signal within the tuft of the distal phalanx of the great toe. There is edema signal without confluent low T1 signal in the great toe proximal phalanx. Ligaments Intact Lisfranc ligament.  No evidence of plantar plate tear Muscles and Tendons There is diffuse muscle atrophy throughout the foot. Soft tissues There is a plantar soft tissue ulcer along the great toe. Diffuse soft tissue edema. IMPRESSION: Findings consistent with a small area of osteomyelitis in the distal tuft of the great toe. Mild edema signal within the great toe proximal phalanx which could represent reactive marrow change or early osteomyelitis. Plantar soft tissue ulcer noted along the great toe. No evidence of soft tissue abscess. Electronically Signed   By: Maurine Simmering   On: 12/25/2020 18:58   DG Chest Portable 1 View  Result Date: 12/24/2020 CLINICAL DATA:  Questionable sepsis, diabetes mellitus, COPD EXAM: PORTABLE CHEST 1 VIEW COMPARISON:  Portable exam 1600 hours compared to 04/12/2020 FINDINGS: Enlargement of cardiac silhouette. Mediastinal contours and pulmonary vascularity normal. Atherosclerotic calcification aorta. Parenchymal scarring at the mid to inferior LEFT chest. Bibasilar atelectasis. Lungs otherwise clear. No definite acute infiltrate, pleural effusion, or pneumothorax. Osseous structures unremarkable. IMPRESSION: Bibasilar atelectasis and chronic LEFT basilar scarring. Enlargement of cardiac silhouette. Aortic Atherosclerosis (ICD10-I70.0). Electronically Signed   By: Lavonia Dana M.D.   On: 12/24/2020 16:18   DG Foot Complete Left  Result Date: 12/27/2020 CLINICAL DATA:  Status post partial 1st ray amputation of the left foot. EXAM: LEFT FOOT - COMPLETE 3+ VIEW COMPARISON:  12/24/2020 FINDINGS: Interval amputation of the great toe and distal aspect of the 1st metatarsal.  Associated soft tissue irregularity and overlying bandage material. No fractures or radiopaque foreign bodies seen. IMPRESSION: Status post amputation of the distal aspect 1st ray, as described above. Electronically Signed   By: Percell Locus.D.  On: 12/27/2020 13:45   DG Foot Complete Left  Result Date: 12/24/2020 CLINICAL DATA:  Plantar ulceration great toe, diabetes mellitus, question osteomyelitis EXAM: LEFT FOOT - COMPLETE 3+ VIEW COMPARISON:  05/25/2005 FINDINGS: Osseous mineralization decreased. Joint spaces preserved. No acute fracture or dislocation. Ulcer identified at plantar aspect of LEFT foot at the level of the proximal phalanx. Small soft tissue calcification versus radiopaque foreign body at ulcer 1.2 mm diameter. No bone destruction to suggest osteomyelitis. IMPRESSION: No definite evidence of osteomyelitis. If this remains a clinical concern, consider MR assessment. Electronically Signed   By: Lavonia Dana M.D.   On: 12/24/2020 16:21   VAS [place] ABI WITH/WO TBI  Result Date: 12/26/2020  LOWER EXTREMITY DOPPLER STUDY Patient Name:  [name]  Date of Exam:   12/26/2020 Medical Rec #: [id number]       Accession #:    [id number] Date of Birth: February 05, 1956      Patient Gender: M Patient Age:   64Y Exam Location:  [facility] Procedure:      VAS [place] ABI WITH/WO TBI Referring Phys: [id number] [place] --------------------------------------------------------------------------------  Indications: Parasthesia. High Risk Factors: Hypertension, Diabetes.  Limitations: Today's exam was limited due to an open wound, involuntary patient              movement and bandages. Comparison Study: No prior studies. Performing Technologist: Carlos Levering RVT  Examination Guidelines: A complete evaluation includes at minimum, Doppler waveform signals and systolic blood pressure reading at the level of bilateral brachial, anterior tibial, and posterior tibial arteries, when vessel segments are accessible.  Bilateral testing is considered an integral part of a complete examination. Photoelectric Plethysmograph (PPG) waveforms and toe systolic pressure readings are included as required and additional duplex testing as needed. Limited examinations for reoccurring indications may be performed as noted.  ABI Findings: +---------+------------------+-----+---------+--------+ Right    Rt Pressure (mmHg)IndexWaveform Comment  +---------+------------------+-----+---------+--------+ Brachial 153                    triphasic         +---------+------------------+-----+---------+--------+ PTA      120               0.78 biphasic          +---------+------------------+-----+---------+--------+ DP       125               0.82 biphasic          +---------+------------------+-----+---------+--------+ Great Toe95                0.62                   +---------+------------------+-----+---------+--------+ +--------+------------------+-----+----------+-------+ Left    Lt Pressure (mmHg)IndexWaveform  Comment +--------+------------------+-----+----------+-------+ [id number]                    biphasic          +--------+------------------+-----+----------+-------+ PTA     117               0.76 monophasic        +--------+------------------+-----+----------+-------+ DP      99                0.65 monophasic        +--------+------------------+-----+----------+-------+ +-------+-----------+-----------+------------+------------+ ABI/TBIToday's ABIToday's TBIPrevious ABIPrevious TBI +-------+-----------+-----------+------------+------------+ Right  0.82       0.62                                +-------+-----------+-----------+------------+------------+  Left   0.76                                           +-------+-----------+-----------+------------+------------+  Summary: Right: Resting right ankle-brachial index indicates mild right lower extremity arterial  disease. The right toe-brachial index is abnormal. Left: Resting left ankle-brachial index indicates moderate left lower extremity arterial disease. Unable to obtain TBI due to open wounds.  *See table(s) above for measurements and observations.  Electronically signed by Monica Martinez MD on 12/26/2020 at 7:18:50 PM.    Final    ECHOCARDIOGRAM COMPLETE  Result Date: 12/06/2020    ECHOCARDIOGRAM REPORT   Patient Name:   [name] [facility] Date of Exam: 12/06/2020 Medical Rec #:  [id number]      Height:       66.0 in Accession #:    [id number]     Weight:       228.0 lb Date of Birth:  03-17-1956     BSA:          2.114 m Patient Age:    44 years       BP:           87/52 mmHg Patient Gender: M              HR:           91 bpm. Exam Location:  Outpatient Procedure: 2D Echo, Cardiac Doppler and Color Doppler Indications:    I27.20 Pulmonary Hypertension  History:        Patient has prior history of Echocardiogram examinations, most                 recent 07/05/2015. CAD, Stroke and COPD; Risk                 Factors:Hypertension, Diabetes, Dyslipidemia, Former Smoker and                 Sleep Apnea. Cocaine abuse. GERD.  Sonographer:    Tiffany Dance Referring Phys: [id number] [place]  1. Left ventricular ejection fraction, by estimation, is 55 to 60%. The left ventricle has normal function. The left ventricle has no regional wall motion abnormalities. There is mild left ventricular hypertrophy. Left ventricular diastolic parameters are consistent with Grade I diastolic dysfunction (impaired relaxation).  2. Right ventricular systolic function is normal. The right ventricular size is normal. Tricuspid regurgitation signal is inadequate for assessing PA pressure.  3. Left atrial size was mildly dilated.  4. The mitral valve is grossly normal. Trivial mitral valve regurgitation.  5. The aortic valve is tricuspid. Aortic valve regurgitation is not visualized.  6. Mildly dilated pulmonary artery.  7.  The inferior vena cava is dilated in size with >50% respiratory variability, suggesting right atrial pressure of 8 mmHg. Comparison(s): Prior images unable to be directly viewed, comparison made by report only. Changes from prior study are noted. 07/05/2015: LVEF 60-65%. FINDINGS  Left Ventricle: Left ventricular ejection fraction, by estimation, is 55 to 60%. The left ventricle has normal function. The left ventricle has no regional wall motion abnormalities. The left ventricular internal cavity size was normal in size. There is  mild left ventricular hypertrophy. Left ventricular diastolic parameters are consistent with Grade I diastolic dysfunction (impaired relaxation). Indeterminate filling pressures. Right Ventricle: The right ventricular size is normal. No increase in right ventricular wall thickness. Right ventricular  systolic function is normal. Tricuspid regurgitation signal is inadequate for assessing PA pressure. Left Atrium: Left atrial size was mildly dilated. Right Atrium: Right atrial size was normal in size. Pericardium: There is no evidence of pericardial effusion. Mitral Valve: The mitral valve is grossly normal. Trivial mitral valve regurgitation. Tricuspid Valve: The tricuspid valve is grossly normal. Tricuspid valve regurgitation is trivial. Aortic Valve: The aortic valve is tricuspid. Aortic valve regurgitation is not visualized. Pulmonic Valve: The pulmonic valve was grossly normal. Pulmonic valve regurgitation is trivial. Aorta: The aortic root and ascending aorta are structurally normal, with no evidence of dilitation. Pulmonary Artery: The pulmonary artery is mildly dilated. Venous: The inferior vena cava is dilated in size with greater than 50% respiratory variability, suggesting right atrial pressure of 8 mmHg. IAS/Shunts: No atrial level shunt detected by color flow Doppler.  LEFT VENTRICLE PLAX 2D LVIDd:         4.90 cm LVIDs:         3.40 cm LV PW:         1.50 cm LV IVS:        1.10  cm LVOT diam:     2.10 cm LV SV:         73 LV SV Index:   35 LVOT Area:     3.46 cm  RIGHT VENTRICLE             IVC RV Basal diam:  3.00 cm     IVC diam: 2.50 cm RV Mid diam:    2.20 cm RV S prime:     11.40 cm/s TAPSE (M-mode): 1.6 cm LEFT ATRIUM           Index       RIGHT ATRIUM           Index LA diam:      3.90 cm 1.84 cm/m  RA Area:     11.40 cm LA Vol (A2C): 38.5 ml 18.21 ml/m RA Volume:   23.70 ml  11.21 ml/m LA Vol (A4C): 67.4 ml 31.88 ml/m  AORTIC VALVE LVOT Vmax:   115.67 cm/s LVOT Vmean:  78.900 cm/s LVOT VTI:    0.211 m  AORTA Ao Root diam: 3.30 cm Ao Asc diam:  3.40 cm MITRAL VALVE MV Area (PHT): 2.73 cm    SHUNTS MV Decel Time: 278 msec    Systemic VTI:  0.21 m MV E velocity: 56.30 cm/s  Systemic Diam: 2.10 cm MV A velocity: 80.40 cm/s MV E/A ratio:  0.70 Lyman Bishop MD Electronically signed by Lyman Bishop MD Signature Date/Time: 12/06/2020/3:49:44 PM    Final      TODAY-DAY OF DISCHARGE:  Subjective:   [name] today has no headache,no chest abdominal pain,no new weakness tingling or numbness, feels much better wants to go home today.  Objective:   Blood pressure (!) 156/67, pulse 84, temperature 97.6 F (36.4 C), resp. rate 19, height 5\' 8"  (1.727 m), weight 102 kg, SpO2 96 %.  Intake/Output Summary (Last 24 hours) at 12/28/2020 0914 Last data filed at 12/28/2020 0821 Gross per 24 hour  Intake 2077.27 ml  Output 1150 ml  Net 927.27 ml   Filed Weights   12/26/20 0500 12/27/20 0635 12/28/20 0534  Weight: 100.5 kg 103.1 kg 102 kg    Exam: Awake Alert, Oriented *3, No new F.N deficits, Normal affect [place].AT,PERRAL Supple Neck,No JVD, No cervical lymphadenopathy appriciated.  Symmetrical Chest wall movement, Good air movement bilaterally, CTAB RRR,No Gallops,Rubs or new Murmurs,  No Parasternal Heave +ve B.Sounds, Abd Soft, Non tender, No organomegaly appriciated, No rebound -guarding or rigidity. No Cyanosis, Clubbing or edema, No new Rash or  bruise   PERTINENT RADIOLOGIC STUDIES: DG Foot Complete Left  Result Date: 12/27/2020 CLINICAL DATA:  Status post partial 1st ray amputation of the left foot. EXAM: LEFT FOOT - COMPLETE 3+ VIEW COMPARISON:  12/24/2020 FINDINGS: Interval amputation of the great toe and distal aspect of the 1st metatarsal. Associated soft tissue irregularity and overlying bandage material. No fractures or radiopaque foreign bodies seen. IMPRESSION: Status post amputation of the distal aspect 1st ray, as described above. Electronically Signed   By: Claudie Revering M.D.   On: 12/27/2020 13:45   VAS [place] ABI WITH/WO TBI  Result Date: 12/26/2020  LOWER EXTREMITY DOPPLER STUDY Patient Name:  [name]  Date of Exam:   12/26/2020 Medical Rec #: [id number]       Accession #:    [id number] Date of Birth: [birth year]      Patient Gender: M Patient Age:   64Y Exam Location:  [facility] Procedure:      VAS [place] ABI WITH/WO TBI Referring Phys: [id number] [place] --------------------------------------------------------------------------------  Indications: Parasthesia. High Risk Factors: Hypertension, Diabetes.  Limitations: Today's exam was limited due to an open wound, involuntary patient              movement and bandages. Comparison Study: No prior studies. Performing Technologist: Carlos Levering RVT  Examination Guidelines: A complete evaluation includes at minimum, Doppler waveform signals and systolic blood pressure reading at the level of bilateral brachial, anterior tibial, and posterior tibial arteries, when vessel segments are accessible. Bilateral testing is considered an integral part of a complete examination. Photoelectric Plethysmograph (PPG) waveforms and toe systolic pressure readings are included as required and additional duplex testing as needed. Limited examinations for reoccurring indications may be performed as noted.  ABI Findings: +---------+------------------+-----+---------+--------+ Right    Rt  Pressure (mmHg)IndexWaveform Comment  +---------+------------------+-----+---------+--------+ Brachial 153                    triphasic         +---------+------------------+-----+---------+--------+ PTA      120               0.78 biphasic          +---------+------------------+-----+---------+--------+ DP       125               0.82 biphasic          +---------+------------------+-----+---------+--------+ Great Toe95                0.62                   +---------+------------------+-----+---------+--------+ +--------+------------------+-----+----------+-------+ Left    Lt Pressure (mmHg)IndexWaveform  Comment +--------+------------------+-----+----------+-------+ [id number]                    biphasic          +--------+------------------+-----+----------+-------+ PTA     117               0.76 monophasic        +--------+------------------+-----+----------+-------+ DP      99                0.65 monophasic        +--------+------------------+-----+----------+-------+ +-------+-----------+-----------+------------+------------+ ABI/TBIToday's ABIToday's TBIPrevious ABIPrevious TBI +-------+-----------+-----------+------------+------------+ Right  0.82       0.62                                +-------+-----------+-----------+------------+------------+  Left   0.76                                           +-------+-----------+-----------+------------+------------+  Summary: Right: Resting right ankle-brachial index indicates mild right lower extremity arterial disease. The right toe-brachial index is abnormal. Left: Resting left ankle-brachial index indicates moderate left lower extremity arterial disease. Unable to obtain TBI due to open wounds.  *See table(s) above for measurements and observations.  Electronically signed by Monica Martinez MD on 12/26/2020 at 7:18:50 PM.    Final      PERTINENT LAB RESULTS: CBC: Recent Labs     12/27/20 0223 12/28/20 0344  WBC 7.2 6.8  HGB 11.4* 11.0*  HCT 34.2* 33.0*  PLT 204 192   CMET CMP     Component Value Date/Time   NA 134 (L) 12/28/2020 0344   K 4.3 12/28/2020 0344   CL 100 12/28/2020 0344   CO2 27 12/28/2020 0344   GLUCOSE 263 (H) 12/28/2020 0344   BUN 18 12/28/2020 0344   CREATININE 1.12 12/28/2020 0344   CREATININE 2.67 (H) 09/26/2014 1138   CALCIUM 8.6 (L) 12/28/2020 0344   PROT 5.9 (L) 12/28/2020 0344   ALBUMIN 2.4 (L) 12/28/2020 0344   AST 11 (L) 12/28/2020 0344   ALT 5 12/28/2020 0344   ALKPHOS 56 12/28/2020 0344   BILITOT 0.4 12/28/2020 0344   GFRNONAA >60 12/28/2020 0344   GFRAA 42 (L) 04/12/2020 1348    GFR Estimated Creatinine Clearance: 76.1 mL/min (by C-G formula based on SCr of 1.12 mg/dL). No results for input(s): LIPASE, AMYLASE in the last 72 hours. No results for input(s): CKTOTAL, CKMB, CKMBINDEX, TROPONINI in the last 72 hours. Invalid input(s): POCBNP No results for input(s): DDIMER in the last 72 hours. No results for input(s): HGBA1C in the last 72 hours. No results for input(s): CHOL, HDL, LDLCALC, TRIG, CHOLHDL, LDLDIRECT in the last 72 hours. No results for input(s): TSH, T4TOTAL, T3FREE, THYROIDAB in the last 72 hours.  Invalid input(s): FREET3 No results for input(s): VITAMINB12, FOLATE, FERRITIN, TIBC, IRON, RETICCTPCT in the last 72 hours. Coags: No results for input(s): INR in the last 72 hours.  Invalid input(s): PT Microbiology: Recent Results (from the past 240 hour(s))  Blood Culture (routine x 2)     Status: None (Preliminary result)   Collection Time: 12/24/20  3:50 PM   Specimen: BLOOD  Result Value Ref Range Status   Specimen Description BLOOD LEFT ANTECUBITAL  Final   Special Requests   Final    BOTTLES DRAWN AEROBIC AND ANAEROBIC Blood Culture adequate volume   Culture   Final    NO GROWTH 4 DAYS Performed at [facility], [address] [place], [place] [address]    Report Status PENDING   Incomplete  Resp Panel by RT-PCR (Flu A&B, Covid)     Status: Abnormal   Collection Time: 12/24/20  4:00 PM   Specimen: Nasopharyngeal(NP) swabs in vial transport medium  Result Value Ref Range Status   SARS Coronavirus 2 by RT PCR POSITIVE (A) NEGATIVE Final    Comment: RESULT CALLED TO, READ BACK BY AND VERIFIED WITH: Radene Ou B RN [id number] 12/24/20 (NOTE) SARS-CoV-2 target nucleic acids are DETECTED.  The SARS-CoV-2 RNA is generally detectable in upper respiratory specimens during the acute phase of infection. Positive results are indicative of the presence of the identified virus,  but do not rule out bacterial infection or co-infection with other pathogens not detected by the test. Clinical correlation with patient history and other diagnostic information is necessary to determine patient infection status. The expected result is Negative.  Fact Sheet for Patients: EntrepreneurPulse.com.au  Fact Sheet for Healthcare Providers: IncredibleEmployment.be  This test is not yet approved or cleared by the [place] FDA and  has been authorized for detection and/or diagnosis of SARS-CoV-2 by FDA under an Emergency Use Authorization (EUA).  This EUA will remain in effect (meaning this test can be used)  for the duration of  the COVID-19 declaration under Section 564(b)(1) of the Act, 21 U.S.C. section 360bbb-3(b)(1), unless the authorization is terminated or revoked sooner.     Influenza A by PCR NEGATIVE NEGATIVE Final   Influenza B by PCR NEGATIVE NEGATIVE Final    Comment: (NOTE) The Xpert Xpress SARS-CoV-2/FLU/RSV plus assay is intended as an aid in the diagnosis of influenza from Nasopharyngeal swab specimens and should not be used as a sole basis for treatment. Nasal washings and aspirates are unacceptable for Xpert Xpress SARS-CoV-2/FLU/RSV testing.  Fact Sheet for Patients: EntrepreneurPulse.com.au  Fact Sheet for  Healthcare Providers: IncredibleEmployment.be  This test is not yet approved or cleared by the [place] FDA and has been authorized for detection and/or diagnosis of SARS-CoV-2 by FDA under an Emergency Use Authorization (EUA). This EUA will remain in effect (meaning this test can be used) for the duration of the COVID-19 declaration under Section 564(b)(1) of the Act, 21 U.S.C. section 360bbb-3(b)(1), unless the authorization is terminated or revoked.  Performed at [facility], [address] [place], [place] [address]   Urine culture     Status: Abnormal   Collection Time: 12/24/20  8:08 PM   Specimen: In/Out Cath Urine  Result Value Ref Range Status   Specimen Description   Final    IN/OUT CATH URINE Performed at [facility], [address] [place], [place] [address]    Special Requests   Final    NONE Performed at [facility], [address] [place], [place] [address]    Culture (A)  Final    <10,000 COLONIES/mL INSIGNIFICANT GROWTH Performed at [facility] [address], [place], [place] [address]    Report Status 12/26/2020 FINAL  Final  MRSA PCR Screening     Status: None   Collection Time: 12/24/20  9:28 PM   Specimen: Nasopharyngeal  Result Value Ref Range Status   MRSA by PCR NEGATIVE NEGATIVE Final    Comment:        The GeneXpert MRSA Assay (FDA approved for NASAL specimens only), is one component of a comprehensive MRSA colonization surveillance program. It is not intended to diagnose MRSA infection nor to guide or monitor treatment for MRSA infections. Performed at [facility], [place] [address] [place], [place] [address]     FURTHER DISCHARGE INSTRUCTIONS:  Get Medicines reviewed and adjusted: Please take all your medications with you for your next visit with your Primary MD  Laboratory/radiological data: Please request your Primary MD to go  over all hospital tests and procedure/radiological results at the follow up, please ask your Primary MD to get all Hospital records sent to his/her office.  In some cases, they will be blood work, cultures and biopsy results pending at the time of your discharge. Please request that your primary care M.D. goes through all the records of your hospital data and follows  up on these results.  Also Note the following: If you experience worsening of your admission symptoms, develop shortness of breath, life threatening emergency, suicidal or homicidal thoughts you must seek medical attention immediately by calling 911 or calling your MD immediately  if symptoms less severe.  You must read complete instructions/literature along with all the possible adverse reactions/side effects for all the Medicines you take and that have been prescribed to you. Take any new Medicines after you have completely understood and accpet all the possible adverse reactions/side effects.   Do not drive when taking Pain medications or sleeping medications (Benzodaizepines)  Do not take more than prescribed Pain, Sleep and Anxiety Medications. It is not advisable to combine anxiety,sleep and pain medications without talking with your primary care practitioner  Special Instructions: If you have smoked or chewed Tobacco  in the last 2 yrs please stop smoking, stop any regular Alcohol  and or any Recreational drug use.  Wear Seat belts while driving.  Please note: You were cared for by a hospitalist during your hospital stay. Once you are discharged, your primary care physician will handle any further medical issues. Please note that NO REFILLS for any discharge medications will be authorized once you are discharged, as it is imperative that you return to your primary care physician (or establish a relationship with a primary care physician if you do not have one) for your post hospital discharge needs so that they can reassess your  need for medications and monitor your lab values.  Total Time spent coordinating discharge including counseling, education and face to face time equals 35 minutes.  SignedOren Binet 12/28/2020 9:14 AM

## 2020-12-29 LAB — CULTURE, BLOOD (ROUTINE X 2)
Culture: NO GROWTH
Special Requests: ADEQUATE

## 2020-12-30 ENCOUNTER — Other Ambulatory Visit: Payer: Self-pay | Admitting: Cardiology

## 2020-12-30 ENCOUNTER — Encounter (HOSPITAL_COMMUNITY): Payer: Self-pay | Admitting: Podiatry

## 2020-12-30 DIAGNOSIS — J449 Chronic obstructive pulmonary disease, unspecified: Secondary | ICD-10-CM | POA: Diagnosis not present

## 2020-12-30 DIAGNOSIS — J9611 Chronic respiratory failure with hypoxia: Secondary | ICD-10-CM | POA: Diagnosis not present

## 2020-12-30 DIAGNOSIS — R269 Unspecified abnormalities of gait and mobility: Secondary | ICD-10-CM | POA: Diagnosis not present

## 2020-12-30 LAB — SURGICAL PATHOLOGY

## 2021-01-02 ENCOUNTER — Other Ambulatory Visit: Payer: Self-pay

## 2021-01-02 ENCOUNTER — Ambulatory Visit (INDEPENDENT_AMBULATORY_CARE_PROVIDER_SITE_OTHER): Payer: Medicaid Other | Admitting: Podiatry

## 2021-01-02 DIAGNOSIS — E1149 Type 2 diabetes mellitus with other diabetic neurological complication: Secondary | ICD-10-CM

## 2021-01-02 DIAGNOSIS — IMO0002 Reserved for concepts with insufficient information to code with codable children: Secondary | ICD-10-CM

## 2021-01-02 DIAGNOSIS — M86072 Acute hematogenous osteomyelitis, left ankle and foot: Secondary | ICD-10-CM

## 2021-01-02 DIAGNOSIS — E1165 Type 2 diabetes mellitus with hyperglycemia: Secondary | ICD-10-CM

## 2021-01-03 ENCOUNTER — Ambulatory Visit: Payer: Medicaid Other | Admitting: Podiatry

## 2021-01-03 DIAGNOSIS — Z794 Long term (current) use of insulin: Secondary | ICD-10-CM | POA: Diagnosis not present

## 2021-01-03 DIAGNOSIS — E11621 Type 2 diabetes mellitus with foot ulcer: Secondary | ICD-10-CM | POA: Diagnosis not present

## 2021-01-03 DIAGNOSIS — Z7984 Long term (current) use of oral hypoglycemic drugs: Secondary | ICD-10-CM | POA: Diagnosis not present

## 2021-01-06 NOTE — Progress Notes (Signed)
Subjective: [name] is a 65 y.o. is seen today in office s/p left partial first amputation preformed on December 27, 2020 with Dr. Posey Pronto.  Patient states that a few weeks before he was admitted to the hospital he got COVID he states that during that time his on the foot opened up and worsened.  He was admitted to the hospital plan of osteomyelitis underwent amputation.  Currently on antibiotics.  Denies any fevers, chills, nausea, vomiting.     Objective: General: No acute distress, AAOx3  DP/PT pulses palpable 2/4, CRT < 3 sec to all digits.  Left foot: Incision is well coapted without any evidence of dehiscence and sutures are intact.  There is faint surrounding erythema but there is no ascending cellulitis.  There is some macerated tissue on the incision.  There is no fluctuation crepitation.  There is no malodor.  No purulence identified. No other open lesions or pre-ulcerative lesions.  No pain with calf compression, swelling, warmth, erythema.   Assessment and Plan:  Status post left foot partial first amputation  -Treatment options discussed including all alternatives, risks, and complications -Incision was clean.  Small amount of Betadine was applied followed by dressing.  We will try to set up home health dressing changes for him as well. -Continue Darco wedge shoe -Continue antibiotics -Elevation -Pain medication as needed. -Monitor for any clinical signs or symptoms of infection and DVT/PE and directed to call the office immediately should any occur or go to the ER. -Follow-up as scheduled or sooner if any problems arise. In the meantime, encouraged to call the office with any questions, concerns, change in symptoms.   Celesta Gentile, DPM

## 2021-01-08 ENCOUNTER — Telehealth: Payer: Self-pay

## 2021-01-08 NOTE — Telephone Encounter (Signed)
Received a message from [place] with [facility], they are not able to provide home health care due to nurse shortage/covid. I also reached out to [place] with [facility] and the patients insurance is OON with them. I will talk to [facility] in our office to find another option.

## 2021-01-10 ENCOUNTER — Encounter: Payer: Self-pay | Admitting: Podiatry

## 2021-01-10 ENCOUNTER — Other Ambulatory Visit: Payer: Self-pay

## 2021-01-10 ENCOUNTER — Ambulatory Visit (INDEPENDENT_AMBULATORY_CARE_PROVIDER_SITE_OTHER): Payer: Medicaid Other | Admitting: Podiatry

## 2021-01-10 VITALS — BP 129/72 | HR 96 | Temp 98.7°F | Resp 12

## 2021-01-10 DIAGNOSIS — E1165 Type 2 diabetes mellitus with hyperglycemia: Secondary | ICD-10-CM

## 2021-01-10 DIAGNOSIS — IMO0002 Reserved for concepts with insufficient information to code with codable children: Secondary | ICD-10-CM

## 2021-01-10 DIAGNOSIS — M86072 Acute hematogenous osteomyelitis, left ankle and foot: Secondary | ICD-10-CM

## 2021-01-10 DIAGNOSIS — E1149 Type 2 diabetes mellitus with other diabetic neurological complication: Secondary | ICD-10-CM

## 2021-01-10 NOTE — Progress Notes (Signed)
Subjective: [name] is a 65 y.o. is seen today in office s/p left partial first amputation preformed on December 27, 2020 with Dr. Posey Pronto.  Still currently on antibiotics and has been using the Darco wedge shoe.  No significant pain at this time but has occasional discomfort.  Denies any fevers, chills, nausea, vomiting.     Objective: General: No acute distress, AAOx3  DP/PT pulses palpable 2/4, CRT < 3 sec to all digits.  Left foot: Incision is well coapted without any evidence of dehiscence and sutures are intact.  The area of the erythema, swelling has much improved and is no ascending cellulitis.  No fluctuation crepitation.  No malodor.  No purulence. No other open lesions or pre-ulcerative lesions.  No pain with calf compression, swelling, warmth, erythema.   Assessment and Plan:  Status post left foot partial first amputation  -Treatment options discussed including all alternatives, risks, and complications -Incision is healing well.  Sutures are intact with suture crusting incisions are locked sutures intact today.  Small amount of Betadine was applied followed by dressing.  Keep dressing clean, dry, intact.  Awaiting home health set up but unfortunately due to staffing issues and out of network if she is not able to get this arranged. -Continue Darco wedge shoe -Continue antibiotics -Monitor for any clinical signs or symptoms of infection and DVT/PE and directed to call the office immediately should any occur or go to the ER. -Follow-up as scheduled or sooner if any problems arise. In the meantime, encouraged to call the office with any questions, concerns, change in symptoms.   *On the office the patient seemed to be tired.  Originally his blood pressure was low but upon recheck it was 129/72 pulse 96 respirations 12 Temp 98.7.  Blood sugar was 256.  Denies any chest pain or any worsening shortness of breath.  Before the patient left he was feeling well I discussed with his family  to keep a close monitoring on him if there is any worsening to the emergency department.  *RTC 1 week with Dr. Shauna Hugh, DPM

## 2021-01-16 ENCOUNTER — Other Ambulatory Visit: Payer: Self-pay | Admitting: Obstetrics and Gynecology

## 2021-01-16 NOTE — Patient Instructions (Signed)
Visit Information  [name]  - as a part of your Medicaid benefit, you are eligible for care management and care coordination services at no cost or copay. I was unable to reach you by phone today but would be happy to help you with your health related needs. Please feel free to call me at [phone].  A member of the Managed Medicaid care management team will reach out to you again over the next 7-14 days.   Aida Raider RN, BSN [place]  [facility] - Managed Medicaid High Risk [phone].

## 2021-01-16 NOTE — Patient Outreach (Signed)
Care Coordination  01/16/2021  DERYCK HIPPLER 1956-07-01 [id number]   Medicaid Managed Care   Unsuccessful Outreach Note  01/16/2021 Name: KAREEN HITSMAN MRN: [id number] DOB: 10-28-1955  Referred by: [name], PA-C (Inactive) Reason for referral : High Risk Managed Medicaid (Unsuccessful telephone outreach)   An unsuccessful telephone outreach was attempted today. The patient was referred to the case management team for assistance with care management and care coordination.   Follow Up Plan: A member of the Managed Medicaid care management team will reach out to the patient again over the next 7-14 days.   Aida Raider RN, BSN [place]  [facility] - Managed Medicaid High Risk [phone].

## 2021-01-17 ENCOUNTER — Ambulatory Visit (INDEPENDENT_AMBULATORY_CARE_PROVIDER_SITE_OTHER): Payer: Medicaid Other | Admitting: Podiatry

## 2021-01-17 ENCOUNTER — Other Ambulatory Visit: Payer: Self-pay

## 2021-01-17 DIAGNOSIS — E1140 Type 2 diabetes mellitus with diabetic neuropathy, unspecified: Secondary | ICD-10-CM | POA: Diagnosis not present

## 2021-01-17 DIAGNOSIS — E1165 Type 2 diabetes mellitus with hyperglycemia: Secondary | ICD-10-CM

## 2021-01-17 DIAGNOSIS — M86072 Acute hematogenous osteomyelitis, left ankle and foot: Secondary | ICD-10-CM

## 2021-01-17 DIAGNOSIS — G629 Polyneuropathy, unspecified: Secondary | ICD-10-CM | POA: Diagnosis not present

## 2021-01-17 DIAGNOSIS — Z9889 Other specified postprocedural states: Secondary | ICD-10-CM

## 2021-01-17 DIAGNOSIS — J449 Chronic obstructive pulmonary disease, unspecified: Secondary | ICD-10-CM | POA: Diagnosis not present

## 2021-01-17 DIAGNOSIS — IMO0002 Reserved for concepts with insufficient information to code with codable children: Secondary | ICD-10-CM

## 2021-01-17 DIAGNOSIS — G8929 Other chronic pain: Secondary | ICD-10-CM | POA: Diagnosis not present

## 2021-01-17 DIAGNOSIS — E1149 Type 2 diabetes mellitus with other diabetic neurological complication: Secondary | ICD-10-CM

## 2021-01-21 ENCOUNTER — Encounter: Payer: Self-pay | Admitting: Podiatry

## 2021-01-21 NOTE — Progress Notes (Addendum)
Subjective: [name] is a 65 y.o. is seen today in office s/p left partial first amputation preformed on December 27, 2020 with by me.  He has been following up with Dr. Jacqualyn Posey.  He states he is doing well.  They have been doing dressing changes every other day.  He denies any other acute complaints.  He would like to discuss next treatment options   Objective: General: No acute distress, AAOx3  DP/PT pulses palpable 2/4, CRT < 3 sec to all digits.  Left foot: Incision is well coapted without any evidence of dehiscence and sutures are intact.  The area of the erythema, swelling has much improved and is no ascending cellulitis.  No fluctuation crepitation.  No malodor.  No purulence. No other open lesions or pre-ulcerative lesions.  No pain with calf compression, swelling, warmth, erythema.   Assessment and Plan:  Status post left foot partial first amputation  -Treatment options discussed including all alternatives, risks, and complications -Incision is healing well however there is significant maceration present around the incision site that led to superficial dehiscence.  Sutures were removed by me today. -Aggressive Betadine wet-to-dry dressings for now -Continue Darco wedge shoe -Continue antibiotics -Monitor for any clinical signs or symptoms of infection and DVT/PE and directed to call the office immediately should any occur or go to the ER. -Follow-up as scheduled or sooner if any problems arise. In the meantime, encouraged to call the office with any questions, concerns, change in symptoms.    Boneta Lucks DPM

## 2021-01-22 DIAGNOSIS — J449 Chronic obstructive pulmonary disease, unspecified: Secondary | ICD-10-CM | POA: Diagnosis not present

## 2021-01-24 ENCOUNTER — Ambulatory Visit: Payer: Medicaid Other | Admitting: Podiatry

## 2021-01-26 NOTE — Progress Notes (Deleted)
Cardiology Office Note  Date: 01/26/2021   ID: [name] [birth year], MRN [id number]  PCP:  [name], PA-C (Inactive)  Cardiologist:  [name], MD Electrophysiologist:  None   Chief Complaint: 1 Month hospital follow up  History of Present Illness: [name] is a 65 y.o. male with a history of COPD / ILD / OSA, CAD, DM2, HTN, CVA , HLD, Renal Ca, CKD III.   He was last seen in the office by Dr. Domenic Polite on 06/10/2020 for routine visit.  Chronically short of breath with NYHA class III symptoms at baseline.  On 6 L nasal cannula at all times.  She experiencing intermittent anginal symptoms and being managed medically.  He was receiving palliative care home services.  He had recently been seen in the pulmonary clinic in September.  He recently presented to [facility] emergency department on 12/24/2020 with worsening wound to the left foot.  He had been following with Dr. Earleen Newport at [facility] clinic for this wound.  He was referred from clinic to the emergency department with diagnosis of possible osteomyelitis and worsening wound.  He had been feeling more short of breath and began to notice swelling along with redness in and left foot. MRI confirmed osteomyelitis. He was treated with IV antibiotics He underwent partial first ray amputation on 6/10. He was noted to have some degree of PAD on ABI. PCP to consider referral to vascular. Podiatry to follow outpatient and adjust antibiotics. He was Covid positive  PAD referral ?   Past Medical History:  Diagnosis Date   Anxiety    Arthritis    Cholecystitis, acute 12/20/2013   Lap chole 6/5   Cocaine abuse ([name])    COPD (chronic obstructive pulmonary disease) ([name])    Coronary atherosclerosis of native coronary artery    a. 03/09/2013 Cath/PCI: LM nl, LAD: 50p, 80m (2.5x16 promus DES), LCX nl, OM1 min irregs, LPL/LPDA diff dzs, RCA nondom, mod diff dzs, EF 55%.   Depression    Essential  hypertension    GERD (gastroesophageal reflux disease)    Headache    Hepatitis Late 1970s   History of nephrolithiasis    History of pneumonia    History of stroke    Right MCA distribution, residual left-sided weakness   Hyperlipidemia    Noncompliance    Renal cell carcinoma ([place])    Status post radical right nephrectomy August 2015   Sleep apnea    On CPAP, 4L O2 no cpap at home yet   Type 2 diabetes mellitus ([place]) 2011    Past Surgical History:  Procedure Laterality Date   AMPUTATION Left 12/27/2020   Procedure: PARTIAL FIRST RAY AMPUTATION OF FOOT;  Surgeon: Felipa Furnace, DPM;  Location: [place];  Service: Podiatry;  Laterality: Left;   APPENDECTOMY  1970's   CHOLECYSTECTOMY N/A 12/22/2013   Procedure: LAPAROSCOPIC CHOLECYSTECTOMY ;  Surgeon: [name], MD;  Location: [place];  Service: General;  Laterality: N/A;   ENDARTERECTOMY Right 10/08/2014   Procedure: Right ENDARTERECTOMY CAROTID;  Surgeon: [name], MD;  Location: [place];  Service: Vascular;  Laterality: Right;   LAPAROSCOPIC LYSIS OF ADHESIONS  02/21/2014   Procedure: LAPAROSCOPIC LYSIS OF ADHESIONS EXTINSIVE;  Surgeon: [name], MD;  Location: [facility];  Service: Urology;;   LEFT HEART CATHETERIZATION WITH CORONARY ANGIOGRAM N/A 06/02/2012   Procedure: LEFT HEART CATHETERIZATION WITH CORONARY ANGIOGRAM;  Surgeon: [name], MD;  Location: [place] CATH LAB;  Service: Cardiovascular;  Laterality: N/A;   LEFT HEART CATHETERIZATION WITH CORONARY ANGIOGRAM N/A 03/09/2013   Procedure: LEFT HEART CATHETERIZATION WITH CORONARY ANGIOGRAM;  Surgeon: [name], MD;  Location: [facility] CATH LAB;  Service: Cardiovascular;  Laterality: N/A;   LEFT HEART CATHETERIZATION WITH CORONARY ANGIOGRAM N/A 12/20/2013   Procedure: LEFT HEART CATHETERIZATION WITH CORONARY ANGIOGRAM;  Surgeon: [name], MD;  Location: [facility] CATH LAB;  Service: Cardiovascular;  Laterality: N/A;   LUNG BIOPSY Left 05/18/2014   Procedure: LUNG  BIOPSY left upper lobe & left lower lobe;  Surgeon: [name], MD;  Location: [place];  Service: Thoracic;  Laterality: Left;   PATCH ANGIOPLASTY Right 10/08/2014   Procedure: PATCH ANGIOPLASTY Right Carotid;  Surgeon: [name], MD;  Location: [place];  Service: Vascular;  Laterality: Right;   ROBOT ASSISTED LAPAROSCOPIC NEPHRECTOMY Right 02/21/2014   Procedure: ROBOTIC ASSISTED LAPAROSCOPIC RIGHT NEPHRECTOMY ;  Surgeon: [name], MD;  Location: [facility];  Service: Urology;  Laterality: Right;   VIDEO ASSISTED THORACOSCOPY Left 05/18/2014   Procedure: LEFT VIDEO ASSISTED THORACOSCOPY;  Surgeon: [name], MD;  Location: [place];  Service: Thoracic;  Laterality: Left;   VIDEO BRONCHOSCOPY  05/18/2014   Procedure: VIDEO BRONCHOSCOPY;  Surgeon: [name], MD;  Location: [place];  Service: Thoracic;;    Current Outpatient Medications  Medication Sig Dispense Refill   acetaminophen (TYLENOL) 325 MG tablet Take 650 mg by mouth as needed (pain).     ALPRAZolam (XANAX) 0.5 MG tablet Take 1 tablet (0.5 mg total) by mouth 3 (three) times daily as needed for anxiety.  0   ALPRAZolam (XANAX) 1 MG tablet Take 1 mg by mouth 3 (three) times daily as needed.     atorvastatin (LIPITOR) 20 MG tablet Take 20 mg by mouth at bedtime.  1   budesonide (PULMICORT) 1 MG/2ML nebulizer solution Take 1 mg by nebulization as needed (shortness of breath).     budesonide-formoterol (SYMBICORT) 160-4.5 MCG/ACT inhaler Inhale 2 puffs into the lungs 2 (two) times daily.     clopidogrel (PLAVIX) 75 MG tablet TAKE ONE TABLET BY MOUTH EVERY DAY 90 tablet 2   diltiazem (CARDIZEM CD) 180 MG 24 hr capsule TAKE ONE CAPSULE BY MOUTH EVERY DAY 90 capsule 2   diphenhydrAMINE (BENADRYL) 25 mg capsule Take 25 mg by mouth every 6 (six) hours as needed for itching (take with percocet).     docusate sodium (COLACE) 100 MG capsule Take 200 mg by mouth at bedtime.      doxycycline (VIBRA-TABS) 100 MG tablet Take 1  tablet (100 mg total) by mouth 2 (two) times daily. 28 tablet 0   esomeprazole (NEXIUM) 40 MG capsule Take 40 mg by mouth at bedtime. Pt only take as needed     FLUoxetine (PROZAC) 10 MG capsule Take 10 mg by mouth daily. Take with 40mg  capsule for a total of 50mg  daily.     FLUoxetine (PROZAC) 20 MG capsule Take by mouth.     FLUoxetine (PROZAC) 40 MG capsule Take 40 mg by mouth daily. Take with 10mg  capsule for a total of 50mg  daily.     formoterol (PERFOROMIST) 20 MCG/2ML nebulizer solution Take 20 mcg by nebulization as needed (shortness of breath).     gabapentin (NEURONTIN) 300 MG capsule Take 900 mg by mouth 3 (three) times daily.     glucose blood (TRUE METRIX BLOOD GLUCOSE TEST) test strip      Glycopyrrolate-Formoterol (  BEVESPI AEROSPHERE) 9-4.8 MCG/ACT AERO Inhale 2 puffs into the lungs 2 (two) times daily. 1 Inhaler 11   insulin regular human CONCENTRATED (HUMULIN R) 500 UNIT/ML injection Inject 90 Units into the skin 3 (three) times daily with meals.     isosorbide mononitrate (IMDUR) 60 MG 24 hr tablet Take 1 tablet (60 mg total) by mouth daily.     linagliptin (TRADJENTA) 5 MG TABS tablet Take 5 mg by mouth daily.     nitroGLYCERIN (NITROSTAT) 0.4 MG SL tablet Place 1 tablet (0.4 mg total) under the tongue every 5 (five) minutes as needed for chest pain.     oxybutynin (DITROPAN-XL) 10 MG 24 hr tablet Take 10 mg by mouth daily.  11   PAXLOVID 20 x 150 MG & 10 x 100MG  TBPK USE AS INSTRUCTED ON PACKAGE FOR 5 DAYS     potassium chloride SA (K-DUR,KLOR-CON) 20 MEQ tablet Take 20 mEq by mouth 2 (two) times daily.      pregabalin (LYRICA) 25 MG capsule Take 25 mg by mouth 2 (two) times daily.     Probiotic Product (PROBIOTIC PO) Take 1 capsule by mouth daily.     sucralfate (CARAFATE) 1 g tablet TAKE ONE TABLET BY MOUTH TWICE DAILY ON an empty stomach     tamsulosin (FLOMAX) 0.4 MG CAPS capsule Take 0.4 mg by mouth daily.      torsemide (DEMADEX) 20 MG tablet TAKE THREE TABLETS BY MOUTH  TWICE DAILY 540 tablet 0   zolpidem (AMBIEN) 5 MG tablet Take 5 mg by mouth at bedtime. For insomnia.     No current facility-administered medications for this visit.   Allergies:  Lisinopril and Oxycodone   Social History: The patient  reports that he has been smoking cigarettes. He started smoking about 48 years ago. He has a 41.00 pack-year smoking history. He has never used smokeless tobacco. He reports that he does not drink alcohol and does not use drugs.   Family History: The patient's family history includes CAD in his brother and father; Cancer in his maternal grandfather, maternal grandmother, and mother; Diabetes in an other family member; Hypertension in an other family member; Lung cancer in his father; Stroke in an other family member.   ROS:  Please see the history of present illness. Otherwise, complete review of systems is positive for none.  All other systems are reviewed and negative.   Physical Exam: VS:  There were no vitals taken for this visit., BMI There is no height or weight on file to calculate BMI.  Wt Readings from Last 3 Encounters:  12/28/20 224 lb 12.8 oz (102 kg)  06/10/20 228 lb (103.4 kg)  04/18/20 232 lb 6 oz (105.4 kg)    General: Patient appears comfortable at rest. HEENT: Conjunctiva and lids normal, oropharynx clear with moist mucosa. Neck: Supple, no elevated JVP or carotid bruits, no thyromegaly. Lungs: Clear to auscultation, nonlabored breathing at rest. Cardiac: Regular rate and rhythm, no S3 or significant systolic murmur, no pericardial rub. Abdomen: Soft, nontender, no hepatomegaly, bowel sounds present, no guarding or rebound. Extremities: No pitting edema, distal pulses 2+. Skin: Warm and dry. Musculoskeletal: No kyphosis. Neuropsychiatric: Alert and oriented x3, affect grossly appropriate.  ECG:  {EKG/Telemetry Strips Reviewed:[id number]}  Recent Labwork: 04/12/2020: B Natriuretic Peptide 84.2 12/28/2020: ALT 5; AST 11; BUN 18;  Creatinine, Ser 1.12; Hemoglobin 11.0; Magnesium 1.8; Platelets 192; Potassium 4.3; Sodium 134     Component Value Date/Time   CHOL 128 12/19/2013 0220  TRIG 152 (H) 12/19/2013 0220   HDL 31 (L) 12/19/2013 0220   CHOLHDL 4.1 12/19/2013 0220   VLDL 30 12/19/2013 0220   LDLCALC 67 12/19/2013 0220    Other Studies Reviewed Today:  CT chest high resolution 09/25/2020 IMPRESSION: 1. No evidence of interstitial lung disease. Air trapping is indicative of small airways disease. 2. Pleuroparenchymal scarring and volume loss in the lower left hemithorax. 3. Aortic atherosclerosis (ICD10-I70.0). Coronary artery calcification. 4. Enlarged pulmonic trunk, indicative of pulmonary arterial hypertension. 5.  Emphysema (ICD10-J43.9).    Lower arterial study 12/26/2020 Right: Resting right ankle-brachial index indicates mild right lower extremity arterial disease. The right toebrachial index is abnormal. Left: Resting left ankle-brachial index indicates moderate left lower extremity arterial disease. Unable to obtain TBI due to open wounds.   Echocardiogram 12/06/2020  1. Left ventricular ejection fraction, by estimation, is 55 to 60%. The  left ventricle has normal function. The left ventricle has no regional  wall motion abnormalities. There is mild left ventricular hypertrophy.  Left ventricular diastolic parameters  are consistent with Grade I diastolic dysfunction (impaired relaxation).   2. Right ventricular systolic function is normal. The right ventricular  size is normal. Tricuspid regurgitation signal is inadequate for assessing  PA pressure.   3. Left atrial size was mildly dilated.   4. The mitral valve is grossly normal. Trivial mitral valve  regurgitation.   5. The aortic valve is tricuspid. Aortic valve regurgitation is not  visualized.   6. Mildly dilated pulmonary artery.   7. The inferior vena cava is dilated in size with >50% respiratory  variability, suggesting right  atrial pressure of 8 mmHg.   Comparison(s): Prior images unable to be directly viewed, comparison made  by report only. Changes from prior study are noted. 07/05/2015: LVEF  60-65%.    Cardiac catheterization 12/20/2013: Hemodynamic Findings: Central aortic pressure: 120/62 Left ventricular pressure: 127/7/15   Angiographic Findings:   Left main: Short segment. No obstructive disease.     Left Anterior Descending Artery: Moderate caliber vessel that courses to the apex. There is 40% proximal stenosis. The mid stented segment is patent without restenosis. The distal vessel has diffuse non-obstructive plaque. The diagonal branches are small in caliber with no obstructive disease.     Circumflex Artery: Large dominant system with large bifurcating first obtuse marginal branch and three small caliber posterolateral branches. The OM branch has diffuse 50% proximal stenosis, no focally obstructive lesions. The three small caliber posterolateral branches all have diffuse moderate to severe stenosis that is unchanged from last cath.     Right Coronary Artery: Small non-dominant vessel with 40% mid vessel stenosis.   Left Ventricular Angiogram: LVEF=65%   Impression: 1. Double vessel CAD with patent stent mid LAD 2. Moderate non-obstructive disease in the Circumflex and RCA 3. Normal LV systolic function   Echocardiogram 07/05/2015: Study Conclusions  - Left ventricle: The cavity size was normal. Wall thickness was   normal. Systolic function was normal. The estimated ejection   fraction was in the range of 60% to 65%. Doppler parameters are   consistent with abnormal left ventricular relaxation (grade 1   diastolic dysfunction).   High-resolution chest CT 08/30/2019: IMPRESSION: 1. Negative for interstitial lung disease. 2. Interval clearing of previously seen subpleural ground-glass in the anterior segment right upper lobe. 3. Aortic atherosclerosis (ICD10-I70.0). Coronary  artery calcification. 4.  Emphysema (ICD10-J43.9). Assessment and Plan:  No diagnosis found.   Medication Adjustments/Labs and Tests Ordered: Current medicines are reviewed  at length with the patient today.  Concerns regarding medicines are outlined above.   Disposition: Follow-up with ***  Signed, Levell July, NP 01/26/2021 8:44 PM    [place] [facility] [address], [place], [place] [address] Phone: [phone]; Fax: [phone]

## 2021-01-27 ENCOUNTER — Ambulatory Visit: Payer: Medicare Other | Admitting: Family Medicine

## 2021-01-29 ENCOUNTER — Ambulatory Visit: Payer: Medicaid Other | Admitting: Podiatry

## 2021-02-04 DIAGNOSIS — H547 Unspecified visual loss: Secondary | ICD-10-CM | POA: Diagnosis not present

## 2021-02-04 DIAGNOSIS — J849 Interstitial pulmonary disease, unspecified: Secondary | ICD-10-CM | POA: Diagnosis not present

## 2021-02-04 DIAGNOSIS — I25110 Atherosclerotic heart disease of native coronary artery with unstable angina pectoris: Secondary | ICD-10-CM | POA: Diagnosis not present

## 2021-02-04 DIAGNOSIS — Z794 Long term (current) use of insulin: Secondary | ICD-10-CM | POA: Diagnosis not present

## 2021-02-04 DIAGNOSIS — Z89412 Acquired absence of left great toe: Secondary | ICD-10-CM | POA: Diagnosis not present

## 2021-02-04 DIAGNOSIS — Z0000 Encounter for general adult medical examination without abnormal findings: Secondary | ICD-10-CM | POA: Diagnosis not present

## 2021-02-04 DIAGNOSIS — Z23 Encounter for immunization: Secondary | ICD-10-CM | POA: Diagnosis not present

## 2021-02-04 DIAGNOSIS — J9611 Chronic respiratory failure with hypoxia: Secondary | ICD-10-CM | POA: Diagnosis not present

## 2021-02-04 DIAGNOSIS — Z7984 Long term (current) use of oral hypoglycemic drugs: Secondary | ICD-10-CM | POA: Diagnosis not present

## 2021-02-05 ENCOUNTER — Ambulatory Visit (INDEPENDENT_AMBULATORY_CARE_PROVIDER_SITE_OTHER): Payer: Medicare Other | Admitting: Podiatry

## 2021-02-05 ENCOUNTER — Other Ambulatory Visit: Payer: Self-pay

## 2021-02-05 DIAGNOSIS — IMO0002 Reserved for concepts with insufficient information to code with codable children: Secondary | ICD-10-CM

## 2021-02-05 DIAGNOSIS — L97522 Non-pressure chronic ulcer of other part of left foot with fat layer exposed: Secondary | ICD-10-CM | POA: Diagnosis not present

## 2021-02-05 DIAGNOSIS — E1149 Type 2 diabetes mellitus with other diabetic neurological complication: Secondary | ICD-10-CM

## 2021-02-05 DIAGNOSIS — Z9889 Other specified postprocedural states: Secondary | ICD-10-CM

## 2021-02-05 DIAGNOSIS — M86072 Acute hematogenous osteomyelitis, left ankle and foot: Secondary | ICD-10-CM

## 2021-02-07 ENCOUNTER — Ambulatory Visit: Payer: Medicaid Other | Admitting: Podiatry

## 2021-02-07 ENCOUNTER — Encounter: Payer: Self-pay | Admitting: Podiatry

## 2021-02-07 NOTE — Progress Notes (Signed)
Subjective:  Patient ID: [name], male    DOB: January 12, 1956,  MRN: [id number]  Chief Complaint  Patient presents with   Wound Check    Wound check     65 y.o. male presents for wound care.  Patient presents with left first MPJ wound status post partial first ray amputation.  Patient states that he has been doing local wound care with Betadine wet-to-dry dressing.  He denies any other acute comes in been doing dressing changes every day.  He would like to discuss further options.   Review of Systems: Negative except as noted in the HPI. Denies N/V/F/Ch.  Past Medical History:  Diagnosis Date   Anxiety    Arthritis    Cholecystitis, acute 12/20/2013   Lap chole 6/5   Cocaine abuse ([name])    COPD (chronic obstructive pulmonary disease) ([name])    Coronary atherosclerosis of native coronary artery    a. 03/09/2013 Cath/PCI: LM nl, LAD: 50p, 39m(2.5x16 promus DES), LCX nl, OM1 min irregs, LPL/LPDA diff dzs, RCA nondom, mod diff dzs, EF 55%.   Depression    Essential hypertension    GERD (gastroesophageal reflux disease)    Headache    Hepatitis Late 1970s   History of nephrolithiasis    History of pneumonia    History of stroke    Right MCA distribution, residual left-sided weakness   Hyperlipidemia    Noncompliance    Renal cell carcinoma ([name])    Status post radical right nephrectomy August 2015   Sleep apnea    On CPAP, 4L O2 no cpap at home yet   Type 2 diabetes mellitus ([place] 2011    Current Outpatient Medications:    acetaminophen (TYLENOL) 325 MG tablet, Take 650 mg by mouth as needed (pain)., Disp: , Rfl:    ALPRAZolam (XANAX) 0.5 MG tablet, Take 1 tablet (0.5 mg total) by mouth 3 (three) times daily as needed for anxiety., Disp: , Rfl: 0   ALPRAZolam (XANAX) 1 MG tablet, Take 1 mg by mouth 3 (three) times daily as needed., Disp: , Rfl:    atorvastatin (LIPITOR) 20 MG tablet, Take 20 mg by mouth at bedtime., Disp: , Rfl: 1   budesonide (PULMICORT) 1 MG/2ML  nebulizer solution, Take 1 mg by nebulization as needed (shortness of breath)., Disp: , Rfl:    budesonide-formoterol (SYMBICORT) 160-4.5 MCG/ACT inhaler, Inhale 2 puffs into the lungs 2 (two) times daily., Disp: , Rfl:    clopidogrel (PLAVIX) 75 MG tablet, TAKE ONE TABLET BY MOUTH EVERY DAY, Disp: 90 tablet, Rfl: 2   diltiazem (CARDIZEM CD) 180 MG 24 hr capsule, TAKE ONE CAPSULE BY MOUTH EVERY DAY, Disp: 90 capsule, Rfl: 2   diphenhydrAMINE (BENADRYL) 25 mg capsule, Take 25 mg by mouth every 6 (six) hours as needed for itching (take with percocet)., Disp: , Rfl:    docusate sodium (COLACE) 100 MG capsule, Take 200 mg by mouth at bedtime. , Disp: , Rfl:    doxycycline (VIBRA-TABS) 100 MG tablet, Take 1 tablet (100 mg total) by mouth 2 (two) times daily., Disp: 28 tablet, Rfl: 0   esomeprazole (NEXIUM) 40 MG capsule, Take 40 mg by mouth at bedtime. Pt only take as needed, Disp: , Rfl:    FLUoxetine (PROZAC) 10 MG capsule, Take 10 mg by mouth daily. Take with '40mg'$  capsule for a total of '50mg'$  daily., Disp: , Rfl:    FLUoxetine (PROZAC) 20 MG capsule, Take by mouth., Disp: , Rfl:    FLUoxetine (  PROZAC) 40 MG capsule, Take 40 mg by mouth daily. Take with '10mg'$  capsule for a total of '50mg'$  daily., Disp: , Rfl:    formoterol (PERFOROMIST) 20 MCG/2ML nebulizer solution, Take 20 mcg by nebulization as needed (shortness of breath)., Disp: , Rfl:    gabapentin (NEURONTIN) 300 MG capsule, Take 900 mg by mouth 3 (three) times daily., Disp: , Rfl:    glucose blood (TRUE METRIX BLOOD GLUCOSE TEST) test strip, , Disp: , Rfl:    Glycopyrrolate-Formoterol (BEVESPI AEROSPHERE) 9-4.8 MCG/ACT AERO, Inhale 2 puffs into the lungs 2 (two) times daily., Disp: 1 Inhaler, Rfl: 11   insulin regular human CONCENTRATED (HUMULIN R) 500 UNIT/ML injection, Inject 90 Units into the skin 3 (three) times daily with meals., Disp: , Rfl:    isosorbide mononitrate (IMDUR) 60 MG 24 hr tablet, Take 1 tablet (60 mg total) by mouth daily.,  Disp: , Rfl:    linagliptin (TRADJENTA) 5 MG TABS tablet, Take 5 mg by mouth daily., Disp: , Rfl:    nitroGLYCERIN (NITROSTAT) 0.4 MG SL tablet, Place 1 tablet (0.4 mg total) under the tongue every 5 (five) minutes as needed for chest pain., Disp: , Rfl:    oxybutynin (DITROPAN-XL) 10 MG 24 hr tablet, Take 10 mg by mouth daily., Disp: , Rfl: 11   PAXLOVID 20 x 150 MG & 10 x '100MG'$  TBPK, USE AS INSTRUCTED ON PACKAGE FOR 5 DAYS, Disp: , Rfl:    potassium chloride SA (K-DUR,KLOR-CON) 20 MEQ tablet, Take 20 mEq by mouth 2 (two) times daily. , Disp: , Rfl:    pregabalin (LYRICA) 25 MG capsule, Take 25 mg by mouth 2 (two) times daily., Disp: , Rfl:    Probiotic Product (PROBIOTIC PO), Take 1 capsule by mouth daily., Disp: , Rfl:    sucralfate (CARAFATE) 1 g tablet, TAKE ONE TABLET BY MOUTH TWICE DAILY ON an empty stomach, Disp: , Rfl:    tamsulosin (FLOMAX) 0.4 MG CAPS capsule, Take 0.4 mg by mouth daily. , Disp: , Rfl:    torsemide (DEMADEX) 20 MG tablet, TAKE THREE TABLETS BY MOUTH TWICE DAILY, Disp: 540 tablet, Rfl: 0   zolpidem (AMBIEN) 5 MG tablet, Take 5 mg by mouth at bedtime. For insomnia., Disp: , Rfl:   Social History   Tobacco Use  Smoking Status Some Days   Packs/day: 1.00   Years: 41.00   Pack years: 41.00   Types: Cigarettes   Start date: 05/03/1972  Smokeless Tobacco Never  Tobacco Comments   2-3 per day as of 03/07/2020    Allergies  Allergen Reactions   Lisinopril Other (See Comments)    Other reaction(s): worsening kidney function   Oxycodone Itching and Nausea Only    Pt is able to tolerate if taken with benadryl   Objective:  There were no vitals filed for this visit. There is no height or weight on file to calculate BMI. Constitutional Well developed. Well nourished.  Vascular Dorsalis pedis pulses palpable bilaterally. Posterior tibial pulses palpable bilaterally. Capillary refill normal to all digits.  No cyanosis or clubbing noted. Pedal hair growth normal.   Neurologic Normal speech. Oriented to person, place, and time. Protective sensation absent  Dermatologic Wound Location: Left first MPJ wound with fat layer exposed probing down to deep tissue no exposure of bone.  No purulent drainage noted. Wound Base: Mixed Granular/Fibrotic Peri-wound: Macerated Exudate: Scant/small amount Serosanguinous exudate Wound Measurements: -See below  Orthopedic: No pain to palpation either foot.   Radiographs: None Assessment:  1. Type II diabetes mellitus with neurological manifestations, uncontrolled ([place])   2. Acute hematogenous osteomyelitis of left foot ([name])   3. Status post foot surgery   4. Chronic foot ulcer with fat layer exposed, left ([place])    Plan:  Patient was evaluated and treated and all questions answered.  Ulcer left first MPJ wound status post partial first ray amputation -Debridement as below. -Dressed with Betadine wet-to-dry, DSD. -Continue off-loading with surgical shoe. -Given the nature of the wound I believe patient will benefit from wound care center follow-up.  Procedure: Excisional Debridement of Wound Tool: Sharp chisel blade/tissue nipper Rationale: Removal of non-viable soft tissue from the wound to promote healing.  Anesthesia: none Pre-Debridement Wound Measurements: 2 cm x 1.5 cm x 0.3 cm  Post-Debridement Wound Measurements: 2.2 cm x 1.7 cm x 0.3 cm  Type of Debridement: Sharp Excisional Tissue Removed: Non-viable soft tissue Blood loss: Minimal (<50cc) Depth of Debridement: subcutaneous tissue. Technique: Sharp excisional debridement to bleeding, viable wound base.  Wound Progress: The wound appears to be more stagnant and has progressed to gotten worse. Site healing conversation 7 Dressing: Dry, sterile, compression dressing. Disposition: Patient tolerated procedure well. Patient to return in 1 week for follow-up.  No follow-ups on file.

## 2021-02-10 ENCOUNTER — Other Ambulatory Visit: Payer: Self-pay | Admitting: Cardiology

## 2021-02-13 ENCOUNTER — Other Ambulatory Visit: Payer: Self-pay | Admitting: Obstetrics and Gynecology

## 2021-02-13 ENCOUNTER — Other Ambulatory Visit: Payer: Self-pay

## 2021-02-13 NOTE — Patient Outreach (Cosign Needed)
Care Coordination  02/13/2021  [name] [birth year] [id number]  RNCM called patient at scheduled time.  RNCM made multiple attempts with line busy each time.  Patient's insurance coverage has changed to [facility] and [employer].  The Managed Medicaid team is not contracted with those companies to provide services. Patient will continue to be followed by his PCP and [facility] for any services.  Aida Raider RN, BSN [place]  [facility] - Managed Medicaid High Risk [phone].

## 2021-02-18 ENCOUNTER — Ambulatory Visit: Payer: Self-pay

## 2021-02-21 ENCOUNTER — Ambulatory Visit: Payer: Medicare Other | Admitting: Family Medicine

## 2021-02-22 DIAGNOSIS — J449 Chronic obstructive pulmonary disease, unspecified: Secondary | ICD-10-CM | POA: Diagnosis not present

## 2021-02-26 ENCOUNTER — Encounter: Payer: Self-pay | Admitting: Internal Medicine

## 2021-02-26 ENCOUNTER — Other Ambulatory Visit: Payer: Self-pay | Admitting: *Deleted

## 2021-02-26 ENCOUNTER — Telehealth: Payer: Self-pay | Admitting: *Deleted

## 2021-02-26 ENCOUNTER — Ambulatory Visit (INDEPENDENT_AMBULATORY_CARE_PROVIDER_SITE_OTHER): Payer: Medicare Other | Admitting: Internal Medicine

## 2021-02-26 ENCOUNTER — Ambulatory Visit (INDEPENDENT_AMBULATORY_CARE_PROVIDER_SITE_OTHER): Payer: Medicaid Other | Admitting: Podiatry

## 2021-02-26 ENCOUNTER — Other Ambulatory Visit: Payer: Self-pay

## 2021-02-26 VITALS — BP 128/74 | HR 91 | Temp 99.1°F | Ht 68.0 in | Wt 219.2 lb

## 2021-02-26 DIAGNOSIS — Z720 Tobacco use: Secondary | ICD-10-CM

## 2021-02-26 DIAGNOSIS — J438 Other emphysema: Secondary | ICD-10-CM | POA: Diagnosis not present

## 2021-02-26 DIAGNOSIS — M86072 Acute hematogenous osteomyelitis, left ankle and foot: Secondary | ICD-10-CM

## 2021-02-26 DIAGNOSIS — J9611 Chronic respiratory failure with hypoxia: Secondary | ICD-10-CM | POA: Diagnosis not present

## 2021-02-26 DIAGNOSIS — IMO0002 Reserved for concepts with insufficient information to code with codable children: Secondary | ICD-10-CM

## 2021-02-26 DIAGNOSIS — J441 Chronic obstructive pulmonary disease with (acute) exacerbation: Secondary | ICD-10-CM | POA: Diagnosis not present

## 2021-02-26 DIAGNOSIS — E1149 Type 2 diabetes mellitus with other diabetic neurological complication: Secondary | ICD-10-CM

## 2021-02-26 DIAGNOSIS — E1165 Type 2 diabetes mellitus with hyperglycemia: Secondary | ICD-10-CM

## 2021-02-26 DIAGNOSIS — R062 Wheezing: Secondary | ICD-10-CM

## 2021-02-26 MED ORDER — PREDNISONE 10 MG PO TABS: ORAL_TABLET | ORAL | 0 refills | Status: AC

## 2021-02-26 MED ORDER — BREZTRI AEROSPHERE 160-9-4.8 MCG/ACT IN AERO: 2.0000 | INHALATION_SPRAY | Freq: Two times a day (BID) | RESPIRATORY_TRACT | 5 refills | Status: DC

## 2021-02-26 NOTE — Telephone Encounter (Signed)
Called patient, no answer, left vmessage for a call back for more clarification on paperwork from [employer] that was submitted

## 2021-02-26 NOTE — Progress Notes (Signed)
Subjective:  Patient ID: [name], male    DOB: 01-06-1956,  MRN: [id number]  Chief Complaint  Patient presents with   Wound Check    PT denies any drainage of wound. Pt states he changes bandages every day as needed.     65 y.o. male presents for wound care.  Patient presents with left first MPJ wound status post partial first ray amputation.  Patient states that he has been doing local wound care with Betadine wet-to-dry dressing.  He denies any other acute comes in been doing dressing changes every day.  He is scheduled for the wound care center next week.  He denies any other acute complaints   Review of Systems: Negative except as noted in the HPI. Denies N/V/F/Ch.  Past Medical History:  Diagnosis Date   Anxiety    Arthritis    Cholecystitis, acute 12/20/2013   Lap chole 6/5   Cocaine abuse ([name])    COPD (chronic obstructive pulmonary disease) ([name])    Coronary atherosclerosis of native coronary artery    a. 03/09/2013 Cath/PCI: LM nl, LAD: 50p, 60m(2.5x16 promus DES), LCX nl, OM1 min irregs, LPL/LPDA diff dzs, RCA nondom, mod diff dzs, EF 55%.   Depression    Essential hypertension    GERD (gastroesophageal reflux disease)    Headache    Hepatitis Late 1970s   History of nephrolithiasis    History of pneumonia    History of stroke    Right MCA distribution, residual left-sided weakness   Hyperlipidemia    Noncompliance    Renal cell carcinoma ([name])    Status post radical right nephrectomy August 2015   Sleep apnea    On CPAP, 4L O2 no cpap at home yet   Type 2 diabetes mellitus ([place] 2011    Current Outpatient Medications:    acetaminophen (TYLENOL) 325 MG tablet, Take 650 mg by mouth as needed (pain)., Disp: , Rfl:    ALPRAZolam (XANAX) 0.5 MG tablet, Take 1 tablet (0.5 mg total) by mouth 3 (three) times daily as needed for anxiety., Disp: , Rfl: 0   ALPRAZolam (XANAX) 1 MG tablet, Take 1 mg by mouth 3 (three) times daily as needed., Disp: , Rfl:     atorvastatin (LIPITOR) 20 MG tablet, Take 20 mg by mouth at bedtime., Disp: , Rfl: 1   budesonide (PULMICORT) 1 MG/2ML nebulizer solution, Take 1 mg by nebulization as needed (shortness of breath)., Disp: , Rfl:    budesonide-formoterol (SYMBICORT) 160-4.5 MCG/ACT inhaler, Inhale 2 puffs into the lungs 2 (two) times daily., Disp: , Rfl:    clopidogrel (PLAVIX) 75 MG tablet, TAKE ONE TABLET BY MOUTH EVERY DAY, Disp: 90 tablet, Rfl: 2   diltiazem (CARDIZEM CD) 180 MG 24 hr capsule, TAKE ONE CAPSULE BY MOUTH EVERY DAY, Disp: 90 capsule, Rfl: 2   diphenhydrAMINE (BENADRYL) 25 mg capsule, Take 25 mg by mouth every 6 (six) hours as needed for itching (take with percocet)., Disp: , Rfl:    docusate sodium (COLACE) 100 MG capsule, Take 200 mg by mouth at bedtime. , Disp: , Rfl:    doxycycline (VIBRA-TABS) 100 MG tablet, Take 1 tablet (100 mg total) by mouth 2 (two) times daily., Disp: 28 tablet, Rfl: 0   esomeprazole (NEXIUM) 40 MG capsule, Take 40 mg by mouth at bedtime. Pt only take as needed, Disp: , Rfl:    FLUoxetine (PROZAC) 10 MG capsule, Take 10 mg by mouth daily. Take with '40mg'$  capsule for a total of  $'50mg'g$  daily., Disp: , Rfl:    FLUoxetine (PROZAC) 20 MG capsule, Take by mouth., Disp: , Rfl:    FLUoxetine (PROZAC) 40 MG capsule, Take 40 mg by mouth daily. Take with '10mg'$  capsule for a total of '50mg'$  daily., Disp: , Rfl:    formoterol (PERFOROMIST) 20 MCG/2ML nebulizer solution, Take 20 mcg by nebulization as needed (shortness of breath)., Disp: , Rfl:    gabapentin (NEURONTIN) 300 MG capsule, Take 900 mg by mouth 3 (three) times daily., Disp: , Rfl:    glucose blood (TRUE METRIX BLOOD GLUCOSE TEST) test strip, , Disp: , Rfl:    Glycopyrrolate-Formoterol (BEVESPI AEROSPHERE) 9-4.8 MCG/ACT AERO, Inhale 2 puffs into the lungs 2 (two) times daily., Disp: 1 Inhaler, Rfl: 11   insulin regular human CONCENTRATED (HUMULIN R) 500 UNIT/ML injection, Inject 90 Units into the skin 3 (three) times daily with  meals., Disp: , Rfl:    isosorbide mononitrate (IMDUR) 60 MG 24 hr tablet, Take 1 tablet (60 mg total) by mouth daily., Disp: , Rfl:    linagliptin (TRADJENTA) 5 MG TABS tablet, Take 5 mg by mouth daily., Disp: , Rfl:    nitroGLYCERIN (NITROSTAT) 0.4 MG SL tablet, PLACE ONE tablet UNDER THE TONGUE every FIVE minutes as needed FOR CHEST PAIN. IF 3 TABLETS ARE NECESSARY CALL 911, Disp: 25 tablet, Rfl: 3   oxybutynin (DITROPAN-XL) 10 MG 24 hr tablet, Take 10 mg by mouth daily., Disp: , Rfl: 11   PAXLOVID 20 x 150 MG & 10 x '100MG'$  TBPK, USE AS INSTRUCTED ON PACKAGE FOR 5 DAYS, Disp: , Rfl:    potassium chloride SA (K-DUR,KLOR-CON) 20 MEQ tablet, Take 20 mEq by mouth 2 (two) times daily. , Disp: , Rfl:    pregabalin (LYRICA) 25 MG capsule, Take 25 mg by mouth 2 (two) times daily., Disp: , Rfl:    Probiotic Product (PROBIOTIC PO), Take 1 capsule by mouth daily., Disp: , Rfl:    sucralfate (CARAFATE) 1 g tablet, TAKE ONE TABLET BY MOUTH TWICE DAILY ON an empty stomach, Disp: , Rfl:    tamsulosin (FLOMAX) 0.4 MG CAPS capsule, Take 0.4 mg by mouth daily. , Disp: , Rfl:    torsemide (DEMADEX) 20 MG tablet, TAKE THREE TABLETS BY MOUTH TWICE DAILY, Disp: 540 tablet, Rfl: 0   zolpidem (AMBIEN) 5 MG tablet, Take 5 mg by mouth at bedtime. For insomnia., Disp: , Rfl:   Social History   Tobacco Use  Smoking Status Some Days   Packs/day: 1.00   Years: 41.00   Pack years: 41.00   Types: Cigarettes   Start date: 05/03/1972  Smokeless Tobacco Never  Tobacco Comments   2-3 per day as of 03/07/2020    Allergies  Allergen Reactions   Lisinopril Other (See Comments)    Other reaction(s): worsening kidney function   Oxycodone Itching and Nausea Only    Pt is able to tolerate if taken with benadryl   Objective:  There were no vitals filed for this visit. There is no height or weight on file to calculate BMI. Constitutional Well developed. Well nourished.  Vascular Dorsalis pedis pulses palpable  bilaterally. Posterior tibial pulses palpable bilaterally. Capillary refill normal to all digits.  No cyanosis or clubbing noted. Pedal hair growth normal.  Neurologic Normal speech. Oriented to person, place, and time. Protective sensation absent  Dermatologic Wound Location: Left first MPJ wound with fat layer exposed probing down to deep tissue no exposure of bone.  No purulent drainage noted. Wound Base:  Mixed Granular/Fibrotic Peri-wound: Macerated Exudate: Scant/small amount Serosanguinous exudate Wound Measurements: -See below  Orthopedic: No pain to palpation either foot.   Radiographs: None Assessment:   1. Type II diabetes mellitus with neurological manifestations, uncontrolled ([place])   2. Acute hematogenous osteomyelitis of left foot ([place])     Plan:  Patient was evaluated and treated and all questions answered.  Ulcer left first MPJ wound status post partial first ray amputation -Debridement as below. -Dressed with Betadine wet-to-dry, DSD. -Continue off-loading with surgical shoe. -Patient has a wound care appointment set up for next Wednesday.  I encouraged him to continue managing and deferring further care to the wound care center.  Procedure: Excisional Debridement of Wound Tool: Sharp chisel blade/tissue nipper Rationale: Removal of non-viable soft tissue from the wound to promote healing.  Anesthesia: none Pre-Debridement Wound Measurements: 2 cm x 1.5 cm x 0.3 cm  Post-Debridement Wound Measurements: 2.2 cm x 1.7 cm x 0.3 cm  Type of Debridement: Sharp Excisional Tissue Removed: Non-viable soft tissue Blood loss: Minimal (<50cc) Depth of Debridement: subcutaneous tissue. Technique: Sharp excisional debridement to bleeding, viable wound base.  Wound Progress: The wound appears to be more stagnant and has progressed to gotten worse. Site healing conversation 7 Dressing: Dry, sterile, compression dressing. Disposition: Patient tolerated procedure well.  Patient to return in 1 week for follow-up.  No follow-ups on file.

## 2021-02-26 NOTE — Progress Notes (Signed)
OV 04/10/2015  Chief Complaint  Patient presents with   Follow-up    Pt states his breathing has worsened since last OV. Pt was admitted to [facility] for CP and syncope. Pt c/o increase in DOE, prod cough with dark brown mucus, chest disfomfort when active.    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 At that point in time spirometry showed a significant decline in forced vital capacity from 4 L at baseline to 2.6 L. He was using 4 L of oxygen at rest and 6 L with exertion at home. However we could not convincingly document hypoxemia with exertion on room air in the office at that time. Therefore to this and I ordered pulmonary function tests and CT scan of the chest. He did have CT scan of the chest in August 2016. There is no report on progression compared to February 2016 but I suspect the findings are similar. While he underwent pulmonary function test he had cough syncope and was admitted to the hospital. Cardiology notes reviewed and the final diagnosis was cough syncope. He he now presents for follow-up. He continues to be miserable with class IV dyspnea. He uses 4-6 liters of oxygen because he subjectively feels the need to do that. He does not monitor his pulse ox at home. He says that he continues to have cough syncope at home and has fallen several times. He has now moved to live with his 25 year old mom. He is socially isolated otherwise. He feels like he needs an extra layer of help at home. There are no other issues  Pulse oximetry on room air at rest and walking desaturation test 185 feet 3 laps on room air: RA rest after 15 min off o2, pulse o98% and lowest pulse ox with forehead probe was 90%. He did get dizzy with coughing   OV 06/05/15 65 year old man with tobacco abuse and history of interstitial lung disease,  desquamative interstitial pneumonitis and burnt out Langerhans' cell histiocytosis, hypoxemic respiratory failure; untreated OSA unable to tolerate CPAP. He probably also has some degree of superimposed COPD.  He presents today with two weeks of increased dyspnea, . He is having paroxysms of cough, can lead to pre-syncope / syncope. Last was 2 days ago. He has LE edema, probable more than his baseline. He is using duoneb bid, symbicort bid, albuterol prn - averages once a day. He complains of dry mouth. He took extra lasix after last OV here with TP, helped his edema but not his dyspnea.     OV 07/05/2015  Chief Complaint  Patient presents with   Follow-up    Pt states his SOB has worsened since last OV. Pt also c/o increase in nonprod cough, pt states he frequently coughs when eating or drinking. Pt c/o right lateral chest pain when coughing. Pt denies f/c/s.   Admit    64yo male smoker with  ILD(DIP)  on Oxygen .  Polysubstance abuse hx.   08/09/2015 [facility] follow up  Patient returns for a post hospital follow-up. Patient was recently admitted with acute on chronic respiratory failure with underlying interstitial lung disease for  acute on chronic diastolic heart failure. He improved with diuresis .  He says he is feeling improved. Has decreased shortness of breath. Unfortunately continues to smoke. We discussed smoking cessation. He denies any hemoptysis, chest pain,  orthopnea, PND, or increased leg swelling.    OV 10/14/2015  Chief Complaint  Patient presents with   Follow-up    Still having SOB, coughing and mild wheezing. Pt states that he feels like fluid is coming back especially on left side.    Follow-up interstitial lung disease and chronic hypoxemic respiratory failure associated with volume overload and diastolic dysfunction and coronary artery disease and multiple medical problems including depression and anxiety and poor social situation  He presents for routine  follow-up. During stable health times of November been able to document hypoxemia and him on room air at rest. Also during admissions he has been hypoxemic. Again today on room air 10 minutes his pulse ox was 97%. He insists that he gets very dyspneic when he turns his oxygen off and he needs 5 L of oxygen at all times. He does not check his pulse ox at home so we do not know his true oxygen levels when he is off oxygen. He has class III-for dyspnea. He is on oxygen. He lives with his mom was 9 years old and takes care of him. Overall he feels stable. [place] symptom assessment score documented below and it is obvious that is significant problems are fatigue, depression, anxiety and shortness of breath. He is open to undergo home palliative care program  [facility] Symptom Assessment Numerical Scale 0 is no problem -> 10 worst problem 10/14/2015    No Pain -> Worst pain 0  No Tiredness -> Worset tiredness 10  No Nausea -> Worst nausea 0  No Depression -> Worst depression 7  No Anxiety -> Worst Anxiety 8  No Drowsiness -> Worst Drowsiness 10  Best appetite-> Worst Appetitle 3  Best Feeling of well being -> Worst feeling 5  No dyspnea-> Worst dyspnea 8  Other problem (none -> severe) 10 - chest discomfort, when active  Completed by  patient         Last CT chest December 2016 and when compared to August 2016 no change in DIC pattern of ILD  Chest x-ray 10/04/2015: Reported as chronic changes or jaw baseline. I do not have these image for visualization  Lab work 07/26/2015: Shows a creatinine of 1.68 mg percent with a GFR of 43. Hemoglobin 13 g percent.  Cardiology visit with Dr. Domenic Polite 08/28/2015: There is discussion about switching his Lasix and metolazone and Demadex. Volume overload is likely multifactorial with RV dysfunction a significant component  Subjective:     Patient ID: KOREE RAPPLEYE, male   DOB: Oct 14, 1955, 65 y.o.   MRN: [id number]  HPI   OV 04/23/2017  Chief  Complaint  Patient presents with   Follow-up    Pt states that his breathing is worse than before. C/o SOB all the time, coughing, and chest pain. 6L O2 24/7.   Follow-up ILD chronic hypoxemic respiratory failure. He did have a high-resolution CT scan of the chest in September 2018. The shows mostly pulmonary edema and fluid pattern. I personally not seen in the last year and a half. He says that he continues to live with his mom. [facility] is visiting him 3 times a week and giving supportive care. He is on a lot of opioids and anxiolytics. He easily is nodding off in the exam room. He doesn't seem interested in giving me a good history. He has no insight into his disease. [place] symptom assessment score shows significant worsening of fatigue and pain and depression and overall  high symptom burden.   OV 03/10/2018  Chief Complaint  Patient presents with   Follow-up    Pt states he is having a lot of pain with his lungs and it is very unbearable. Pt also states his SOB is a lot worse, coughs all the time, has a lot of back pain, is very unsteady on his feet, and also has occ. CP.    [name] , 65 y.o. , with dob 05-26-1956 and male ,Not Hispanic or Latino from [address] [place] [address] - presents to lung clinic for hronic hypoxemic respiratory failure that previously eyes seen was on the basis of ILD but CT scan last in 2018 showed that he did not have ILD. In fact he's had a follow-up CT scan of the chest May 2019 that does not show ILD. But he does have chronic hypoxemic respiratory failure. The basis of this is poorly understood but given his smoking history and 2016 pulmonary function test showing isolated reduction in diffusion capacity this would be all COPD/emphysema. He has missed many appointments and is very poor with history given his opioid dependence. Today he is a little bit more awake and he tells me that he continues to be on 5 L oxygen  nasal cannula. He says that he frequently dozes off and loses balance and falls.He also says for the last few months his lungs have been hurting but is no cough or congestion. He says he is on nebulizers but we do not have this in the med list. He does not know what nebulizers he is on. On exam today was significantly wheezing according to the medical assistant.   OV 08/01/2018  Chief Complaint  Patient presents with   Follow-up    Pt states his breathing has become worse since last visit, has a dry cough, has had chest pain, and states his lungs have hurt. Pt states he feels like his lungs are burning.      OV 08/01/2018  Subjective:  Patient ID: [name], male , DOB: 1956-04-12 , age 57 y.o. , MRN: [id number] , ADDRESS: [place] [address]   08/01/2018 -   Chief Complaint  Patient presents with   Follow-up    Pt states his breathing has become worse since last visit, has a dry cough, has had chest pain, and states his lungs have hurt. Pt states he feels like his lungs are burning.     HPI CORRI BLAUER 65 y.o. -presents for his follow-up of mixed emphysema with DIP/burned-out Langerhans' cell histiocytosis.  Since his last visit he got admitted November 2019 with acute on chronic hypoxemic respiratory failure.  In the interim he continues to smoke.  He is no longer in hospice but is requesting hospice back because he benefited from the extra layer of support of the palliative piece.  He thinks he might be dying but there is no evidence of that.  His primary care physician ordered ultrasound of the liver that is reported as diffuse hepatocellular disease not otherwise specified.  He will follow-up with [place] primary care physician about it.  Today try to requalify for oxygen.  We turned his oxygen off and walked him but even after 1 lap he became fatigued and wobbly and was at fall risk so we could not walk him any further.  His main issue is that he has significant  multiple symptomatology that includes significant fatigue and nonspecific abdominal pain and shortness of breath.  COPD CAT score showed a symptom burden at 33 with fatigue at rated as very severe, sleep quality is very severe limitation in activities is very severe and dyspnea is very severe and chest tightness is very severe.  The only thing he does not have is any sputum production but he has a moderate cough.       OV 09/08/2018  Subjective:  Patient ID: [name], male , DOB: [birth year] , age 57 y.o. , MRN: [id number] , ADDRESS: [place] [address]   09/08/2018 -   Chief Complaint  Patient presents with   Follow-up    PFT performed today. Pt states he has been worse since last visit. States he had the flu 1 week ago. Pt states his breathing is worse and has more fatigue than usual.   Interstitial lung disease with emphysema -surgical lung biopsy May 18, 2014 read in [place] as DIP with burned-out Langerhans' cell histiocytosis but reinterpreted February 2020 at [facility] as [facility] = smoking-related interstitial fibrosis associated with emphysema  Polypharmacy pain medications and heavy symptom burden  HPI TRAYDEN REVEAL 65 y.o. -returns for follow-up.  He was seen approximately 1 month ago.  At that point in time we decided to re-stage and re-figure his interstitial lung disease.  I discussed his 73 pathology slide at our interstitial lung disease conference.  Our local pulmonary pathologist feels strongly that there is no evidence of Langerhan histiocytosis X.  Instead he has smoking-related interstitial fibrosis (SRIF) with associated emphysema.  On his latest high-resolution CT chest there is not much of a description of ILD even though he has crackles on exam.  Report as below and I personally visualized that CT.  He has a steady progression and worsening of pulmonary function test over 3 or 4 years as documented below.  However, he is not  hypoxemic.  He is continuing to use oxygen and is requesting oxygen for symptomatic relief of dyspnea.  However when he turned the oxygen off and walked him he is not hypoxemic at all.  His room air at rest pulse ox is 97% with heart rate of 88/min.  We walked him 2 laps and he stopped and his pulse ox dropped to 92% and his heart rate rose to 94%.  While he does drop his pulse ox by more than 3 points he is not going below 88%.  On the other hand he is becoming very symptomatic with severe shortness of breath dizziness and staggering.  Medical assistant notes that he is always nodding off.  We administered a [place] symptom assessment score and he has significant poly-symptom burden.  Medication review shows polypharmacy.  He is unable to quit any of these.    CT chest high resolution 08/11/2018 Lungs/Pleura: Postoperative changes of wedge resection are again noted in the left lower lobe and tracking along the left major fissure, similar to the prior study. However, compared to the prior examination there is greatly increased pleuroparenchymal thickening and architectural distortion in the mid to lower left hemithorax with extensive parenchymal banding throughout this region. These findings are markedly asymmetric compared to the contralateral side where there is only minimal linear scarring in the posterior aspect of the right lower lobe in the most dependent portion of the lung. High-resolution images otherwise demonstrate no widespread areas of ground-glass attenuation, septal thickening, traction bronchiectasis or honeycombing elsewhere in the lungs. Inspiratory and expiratory imaging demonstrates mild air trapping, most evident in the left lower  lobe. No acute consolidative airspace disease. No pleural effusions. Diffuse bronchial wall thickening with mild centrilobular and paraseptal emphysema.  IMPRESSION: 1. Although there are areas of increasing fibrosis throughout the mid to lower  lung, these findings are clearly asymmetric with the contralateral side and are favored to be related to developing areas of pleuroparenchymal scarring, potentially related to prior surgery. The pattern is not a usual pattern for any typical interstitial lung disease, and there is no involvement of the right lung. 2. Diffuse bronchial wall thickening with mild centrilobular and paraseptal emphysema; imaging findings compatible with the reported clinical history of underlying COPD. 3. Air trapping, most evident in the left lower lobe, indicative of small airways disease. 4. Aortic atherosclerosis, in addition to left main and 3 vessel coronary artery disease. Please note that although the presence of coronary artery calcium documents the presence of coronary artery disease, the severity of this disease and any potential stenosis cannot be assessed on this non-gated CT examination. Assessment for potential risk factor modification, dietary therapy or pharmacologic therapy may be warranted, if clinically indicated. 5. Severe hepatic steatosis. 6. Lipomatous hypertrophy of the interatrial septum.   Aortic Atherosclerosis (ICD10-I70.0) and Emphysema (ICD10-J43.9).     Electronically Signed   By: Vinnie Langton M.D.   On: 08/12/2018 08:29    OV 05/03/2019  Subjective:  Patient ID: [name], male , DOB: 1956/05/07 , age 34 y.o. , MRN: [id number] , ADDRESS: [place] [address]   Interstitial lung disease with emphysema -surgical lung biopsy May 18, 2014 read in [place] as DIP with burned-out Langerhans' cell histiocytosis but reinterpreted February 2020 at [facility] as [facility] = smoking-related interstitial fibrosis associated with emphysema  05/03/2019 -   Chief Complaint  Patient presents with   COPD with acute exacerbation    Feels his COPD has gotten worse since the televisit on 04/20/19    Fu mixed ILD and emphysema with chronic  respiratory failure    HPI RAJDEEP ABBS 65 y.o. -presents for follow-up.  Last seen by myself in early part of the year.  Since then has seen nurse practitioner 2 or 3 times through combination of physical visits and video/telephone visits.  1 of the times he is sent to the emergency department and admitted.  Last CT scan of the chest was in April 2020 and did not show any progression of his combination emphysema and ILD.  He had a blood gas at that time without any hypercapnia.  He tells me that he continues to live with his mom.  He uses between 6 and 8 L of oxygen and is stable.  He continues to have multiple symptoms including confusion early in the morning [he is noticed to be on a lot of pain meds] and chronic pain including pain in the right lateral chest.  In terms of his breathing he is stable.  Overall his quality of life and functional status is poor with an ECOG of 4.  Home palliative care used to visit with him but not anymore.  He wants the support again.  He is not interested in hospice.  He is asking for the shingles shot but we do not have this.  He will have his high-dose flu shot today.  Overall supportive care    Results for RAWAD, REGAN (MRN [id number]) as of 05/03/2019 10:53  Ref. Range 11/07/2018 17:17  pH, Ven Latest Ref Range: 7.250 - 7.430  7.373  pCO2, Ven Latest  Ref Range: 44.0 - 60.0 mmHg 40.6 (L)  pO2, Ven Latest Ref Range: 32.0 - 45.0 mmHg 63.0 (H)    IMPRESSION: Chest:   1. New small focal area of peripheral ground-glass density in the anterior right upper lobe is likely infectious or inflammatory. 2. Unchanged pleuroparenchymal scarring and fibrosis in the mid to lower left hemithorax. 3.  Emphysema (ICD10-J43.9). 4.  Aortic atherosclerosis (ICD10-I70.0).   Abdomen and pelvis:   1.  No acute intra-abdominal process. 2. Hepatic steatosis. 3. Prior right nephrectomy. 4. Bilateral femoral head avascular necrosis.     Electronically Signed   By:  Titus Dubin M.D.   On: 11/07/2018 23:33     OV 06/08/2019  Subjective:  Patient ID: [name], male , DOB: 03-08-1956 , age 55 y.o. , MRN: [id number] , ADDRESS: [place] [address]    Interstitial lung disease with emphysema -surgical lung biopsy May 18, 2014 read in [place] as DIP with burned-out Langerhans' cell histiocytosis but reinterpreted February 2020 at [facility] as [facility] = smoking-related interstitial fibrosis associated with emphysema  06/08/2019 -   Chief Complaint  Patient presents with   Hospitalization Follow-up    Pt in hosp 10/30-11/1.  Pt states he has had good days and bad days since being out of the hospital. Pt states he does have alot of SOB, has an occ cough due to getting choked, and has chest tightness.      Interstitial lung disease with emphysema -surgical lung biopsy May 18, 2014 read in [place] as DIP with burned-out Langerhans' cell histiocytosis but reinterpreted February 2020 at [facility] as [facility] = smoking-related interstitial fibrosis associated with emphysema  Chronic hypoxemic respiratory failure due to the above on 6 L of oxygen at baseline  Ongoing smoking  Ongoing polypharmacy and opioid use  HPI SALIF SERVELLON 65 y.o. -last seen mid October 2020.  Then to his end of October 2020 he got admitted with sepsis elevated lactic acid.  Diagnosed to have pneumonia based on chest x-ray and then treated with antibiotics.  Discharge November 1 second 2020 on Augmentin.  He is now back to his baseline of 6 L oxygen and is beginning to feel well.  Nevertheless overall he still feels deconditioned.  As usual he says his lungs hurt.  This visit was set up mainly in the context of his recent admission.  Admission labs reviewed and in particular he seems to have developed hypercapnia at least he was hypercapnic in the hospital.  He is no longer using his CPAP.  Unclear why.  He is interested in getting this  back.  He is interested in BiPAP as well if he qualifies.  He continues to have a large symptom burden as documented below.  Last CT scan of the chest April 2020      Results for DAMICO, TRZCINSKI (MRN [id number]) as of 09/08/2018 11:46  Ref. Range 04/13/2014 11:43 09/07/2014 11:39 09/08/2018 10:21  FVC-Pre Latest Units: L 4.37 4.03 3.80  FVC-%Pred-Pre Latest Units: % 90 83 81   Results for ALEJANDRA, LEMMO (MRN [id number]) as of 09/08/2018 11:46  Ref. Range 04/13/2014 11:43 09/07/2014 11:39 09/08/2018 10:21  DLCO unc Latest Units: ml/min/mmHg 17.92 16.41 13.84  DLCO unc % pred Latest Units: % 55 50 51   Results for NISCHAL, OCH (MRN [id number]) as of 06/08/2019 11:39  Ref. Range 05/19/2019 15:00  pH, Arterial Latest Ref Range: 7.350 - 7.450  7.346 (L)  pCO2  arterial Latest Ref Range: 32.0 - 48.0 mmHg 52.1 (H)  pO2, Arterial Latest Ref Range: 83.0 - 108.0 mmHg 73.5 (L)    OV 10/30/2019  Subjective:  Patient ID: [name], male , DOB: [birth year] , age 20 y.o. , MRN: [id number] , ADDRESS: [address] [place] [address]   10/30/2019 -   Chief Complaint  Patient presents with   Follow-up    Pt states he believes his breathing has become worse since last visit. States when he walks, he will begin to have pain in his lungs and chest.     Interstitial lung disease with emphysema -surgical lung biopsy May 18, 2014 read in [place] as DIP with burned-out Langerhans' cell histiocytosis but reinterpreted February 2020 at [facility] as [facility] = smoking-related interstitial fibrosis associated with emphysema  Chronic hypoxemic respiratory failure due to the above on 6 L of oxygen at baseline  Ongoing smoking  Ongoing polypharmacy and opioid use   HPI ANTRONE ZEH 65 y.o. -presents for follow-up.  After the last visit he had a work-up.  Blood gas did not show any hypercapnia.  High-resolution CT chest that showed emphysema.  No ILD.  His pulmonary function test though  shows decline.  Review of the chart indicates that he is in need of alpha-1 antitrypsin level checked.  He continues to have significant symptoms.  Symptom score is documented.  He says also for the last 1 month he is coughing discolored sputum that is a change from baseline.  The sputum is yellow in color.  He says cardiac etiology has been ruled out as reason for his dyspnea.  He is frustrated by his multiple symptomatology that includes pain in his breast and overall discomfort and exhaustion and tiredness.  We have struggled to fix all this.  He continues to have polypharmacy and smoking.  He lives with his mom.  PFT   Results for TOXIE, SIMONINI (MRN [id number]) as of 10/30/2019 09:44  Ref. Range 04/13/2014 11:43 09/07/2014 11:39 09/08/2018 10:21 09/04/2019 11:01  FVC-Pre Latest Units: L 4.37 4.03 3.80 3.47  FVC-%Pred-Pre Latest Units: % 90 83 81 74  Results for ASHA, SEARS (MRN [id number]) as of 10/30/2019 09:44  Ref. Range 04/13/2014 11:43 09/07/2014 11:39 09/08/2018 10:21 09/04/2019 11:01  DLCO unc Latest Units: ml/min/mmHg 17.92 16.41 13.84 14.15  DLCO unc % pred Latest Units: % 55 50 51 52   ROS - per HPI  IMPRESSION: 1. Negative for interstitial lung disease. 2. Interval clearing of previously seen subpleural ground-glass in the anterior segment right upper lobe. 3. Aortic atherosclerosis (ICD10-I70.0). Coronary artery calcification. 4.  Emphysema (ICD10-J43.9).     Electronically Signed   By: Lorin Picket M.D.   On: 08/30/2019 16:20    OV 02/26/2021  Subjective:  Patient ID: [name], male , DOB: 08/21/1955 , age 40 y.o. , MRN: [id number] , ADDRESS: [place] [id number] PCP [name], PA-C (Inactive) Patient Care Team: [name], PA-C (Inactive) as PCP - General (Physician Assistant) Satira Sark, MD as PCP - Cardiology (Cardiology) [name], MD as Consulting Physician (Urology) Brand Males, MD as Consulting  Physician (Pulmonary Disease) [name], Stephanie G, NP (Inactive) as Nurse Practitioner (Hospice and Palliative Medicine) Julianne Handler, NP (Inactive) as Nurse Practitioner (Hospice and Palliative Medicine) Trula Slade, DPM as Consulting Physician (Podiatry)  This Provider for this visit: Treatment Team:  Attending Provider: Brand Males, MD    02/26/2021 -  Chief Complaint  Patient presents with   Follow-up    Pt states he has been about the same since last visit. States he does have an occ cough and breathing is about the same.    Interstitial lung disease with emphysema (alpha 1 MM) -surgical lung biopsy May 18, 2014 read in [place] as DIP with burned-out Langerhans' cell histiocytosis but reinterpreted February 2020 at [facility] [facility] [facility] = smoking-related interstitial fibrosis associated with emphysema  Chronic hypoxemic respiratory failure due to the above on 6 L of oxygen at baseline  Ongoing smoking  Ongoing polypharmacy and opioid use  Non specific chronic msk chest pain  HPI  [facility] [facility] -not seeing Tamel Kear in over a year.  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  Review of the records indicate he is always wheezing.  He continues to live with his mom.  He is continues to use 6 L of oxygen.  He is on Bevespi inhaler.  He again complains that his lungs hurt in the back.  He had a CT scan of the chest in March 2022 and its mostly emphysema.  He also had a chest x-ray in June 2022 during a hospitalization and it was clear other than atelectasis.  He points to his right upper back in the intrascapular area as the place where it hurts.  He is to have home palliative care and he benefited from it but apparently they are not coming anymore.  He wants a referral.  He is awaiting to amputation.  He uses a cane.  He has chronic pain.  He is poor quality of life.  He did have COVID in May 2022.  Overall symptom assessment is that he appears to be at  baseline   [facility] Symptom Assessment Numerical Scale 0 is no problem -> 10 worst problem 10/14/2015   04/23/2017  08/01/2018  06/08/2019  10/30/2019  02/26/2021   No Pain -> Worst pain 0 '7 9 9 7 8  '$ No Tiredness -> Worset tiredness '10 9 10 10 10 10  '$ No Nausea -> Worst nausea 0 0 0 3 2 0  No Depression -> Worst depression '7 10 5 9 9 8  '$ No Anxiety -> Worst Anxiety '8 5 5 8 8 9  '$ No Drowsiness -> Worst Drowsiness 10 3 (though was nodding off) '1 9 3 3  '$ Best appetite-> Worst Appetitle '3 4 3 3 8 7  '$ Best Feeling of well being -> Worst feeling '5 7 1 4 8 7  '$ Cough   '8 9 10 2  '$ No dyspnea-> Worst dyspnea '8 8 10 10 10 8  '$ Other problem (none -> severe) 10 - chest discomfort, when active 7 leg pain 10 10 chest discomfort and other miscellaneous  Lungs  hurt in the back at time  Completed by  patient patient patient patient  pateient           Results for KHAIDYN, JEWART (MRN [id number]) as of 10/30/2019 09:44  Ref. Range 06/12/2019 13:50  FIO2 Unknown 44.00  pH, Arterial Latest Ref Range: 7.350 - 7.450  7.410  pCO2 arterial Latest Ref Range: 32.0 - 48.0 mmHg 45.0  pO2, Arterial Latest Ref Range: 83.0 - 108.0 mmHg 93.1      PFT  PFT Results Latest Ref Rng & Units 09/04/2019 09/08/2018 09/07/2014 04/13/2014  FVC-Pre L 3.47 3.80 4.03 4.37  FVC-Predicted Pre % 74 81 83 90  FVC-Post L - - 4.29 4.43  FVC-Predicted Post % - -  89 92  Pre FEV1/FVC % % 77 76 81 82  Post FEV1/FCV % % - - 83 83  FEV1-Pre L 2.68 2.88 3.28 3.58  FEV1-Predicted Pre % 77 82 90 97  FEV1-Post L - - 3.58 3.67  DLCO uncorrected ml/min/mmHg 14.15 13.84 16.41 17.92  DLCO UNC% % 52 51 50 55  DLVA Predicted % 71 62 57 63  TLC L - - 6.36 6.11  TLC % Predicted % - - 90 87  RV % Predicted % - - 85 76   HRCT March 2022   IMPRESSION: 1. No evidence of interstitial lung disease. Air trapping is indicative of small airways disease. 2. Pleuroparenchymal scarring and volume loss in the lower left hemithorax. 3. Aortic  atherosclerosis (ICD10-I70.0). Coronary artery calcification. 4. Enlarged pulmonic trunk, indicative of pulmonary arterial hypertension. 5.  Emphysema (ICD10-J43.9).     Electronically Signed   By: Lorin Picket M.D.   On: 09/25/2020 08:19   CXR June 2022  IMPRESSION: Bibasilar atelectasis and chronic LEFT basilar scarring.   Enlargement of cardiac silhouette.   Aortic Atherosclerosis (ICD10-I70.0).     Electronically Signed   By: Lavonia Dana M.D.   On: 12/24/2020 16:18     has a past medical history of Anxiety, Arthritis, Cholecystitis, acute (12/20/2013), Cocaine abuse ([place]), COPD (chronic obstructive pulmonary disease) ([place]), Coronary atherosclerosis of native coronary artery, Depression, Essential hypertension, GERD (gastroesophageal reflux disease), Headache, Hepatitis (Late 1970s), History of nephrolithiasis, History of pneumonia, History of stroke, Hyperlipidemia, Noncompliance, Renal cell carcinoma ([place]), Sleep apnea, and Type 2 diabetes mellitus ([place]) (2011).   reports that he has been smoking cigarettes. He started smoking about 48 years ago. He has a 41.00 pack-year smoking history. He has never used smokeless tobacco.  Past Surgical History:  Procedure Laterality Date   AMPUTATION Left 12/27/2020   Procedure: PARTIAL FIRST RAY AMPUTATION OF FOOT;  Surgeon: Felipa Furnace, DPM;  Location: [place];  Service: Podiatry;  Laterality: Left;   APPENDECTOMY  1970's   CHOLECYSTECTOMY N/A 12/22/2013   Procedure: LAPAROSCOPIC CHOLECYSTECTOMY ;  Surgeon: [name], MD;  Location: [place];  Service: General;  Laterality: N/A;   ENDARTERECTOMY Right 10/08/2014   Procedure: Right ENDARTERECTOMY CAROTID;  Surgeon: [name], MD;  Location: [place];  Service: Vascular;  Laterality: Right;   LAPAROSCOPIC LYSIS OF ADHESIONS  02/21/2014   Procedure: LAPAROSCOPIC LYSIS OF ADHESIONS EXTINSIVE;  Surgeon: [name], MD;  Location: [facility];  Service: Urology;;   LEFT HEART  CATHETERIZATION WITH CORONARY ANGIOGRAM N/A 06/02/2012   Procedure: LEFT HEART CATHETERIZATION WITH CORONARY ANGIOGRAM;  Surgeon: [name], MD;  Location[facility] C[facility] LAB;  Service: Cardiovascular;  Laterality: N/A;   LEFT HEART CATHETERIZATION WITH CORONARY ANGIOGRAM N/A 03/09/2013   Procedure: LEFT HEART CATHETERIZATION WITH CORONARY ANGIOGRAM;  Surgeon: [name], MD;  Location: [facility] CATH LAB;  Service: Cardiovascular;  Laterality: N/A;   LEFT HEART CATHETERIZATION WITH CORONARY ANGIOGRAM N/A 12/20/2013   Procedure: LEFT HEART CATHETERIZATION WITH CORONARY ANGIOGRAM;  Surgeon: [name], MD;  Location: [facility] CATH LAB;  Service: Cardiovascular;  Laterality: N/A;   LUNG BIOPSY Left 05/18/2014   Procedure: LUNG BIOPSY left upper lobe & left lower lobe;  Surgeon: [name], MD;  Location: [place];  Service: Thoracic;  Laterality: Left;   PATCH ANGIOPLASTY Right 10/08/2014   Procedure: PATCH ANGIOPLASTY Right Carotid;  Surgeon: [name], MD;  Location: [place];  Service: Vascular;  Laterality: Right;   ROBOT  ASSISTED LAPAROSCOPIC NEPHRECTOMY Right 02/21/2014   Procedure: ROBOTIC ASSISTED LAPAROSCOPIC RIGHT NEPHRECTOMY ;  Surgeon: [name], MD;  Location: [facility];  Service: Urology;  Laterality: Right;   VIDEO ASSISTED THORACOSCOPY Left 05/18/2014   Procedure: LEFT VIDEO ASSISTED THORACOSCOPY;  Surgeon: [name], MD;  Location: [place];  Service: Thoracic;  Laterality: Left;   VIDEO BRONCHOSCOPY  05/18/2014   Procedure: VIDEO BRONCHOSCOPY;  Surgeon: [name], MD;  Location: [place];  Service: Thoracic;;    Allergies  Allergen Reactions   Lisinopril Other (See Comments)    Other reaction(s): worsening kidney function   Oxycodone Itching and Nausea Only    Pt is able to tolerate if taken with benadryl    Immunization History  Administered Date(s) Administered   Fluad Quad(high Dose 65+) 05/03/2019   Influenza Split 04/04/2014, 03/27/2015    Moderna Sars-Covid-2 Vaccination 03/07/2020, 04/04/2020   Pneumococcal Polysaccharide-23 11/04/2012   Zoster, Live 02/05/2020    Family History  Problem Relation Age of Onset   Cancer Mother        Thyroid - living in her 34's.   Hypertension Other    Diabetes Other    Stroke Other    Lung cancer Father    CAD Father    Cancer Maternal Grandmother        Breast   Cancer Maternal Grandfather        Throat and stomach   CAD Brother      Current Outpatient Medications:    acetaminophen (TYLENOL) 325 MG tablet, Take 650 mg by mouth as needed (pain)., Disp: , Rfl:    ALPRAZolam (XANAX) 0.5 MG tablet, Take 1 tablet (0.5 mg total) by mouth 3 (three) times daily as needed for anxiety., Disp: , Rfl: 0   ALPRAZolam (XANAX) 1 MG tablet, Take 1 mg by mouth 3 (three) times daily as needed., Disp: , Rfl:    atorvastatin (LIPITOR) 20 MG tablet, Take 20 mg by mouth at bedtime., Disp: , Rfl: 1   Budeson-Glycopyrrol-Formoterol (BREZTRI AEROSPHERE) 160-9-4.8 MCG/ACT AERO, Inhale 2 puffs into the lungs in the morning and at bedtime., Disp: 10.7 g, Rfl: 5   budesonide (PULMICORT) 1 MG/2ML nebulizer solution, Take 1 mg by nebulization as needed (shortness of breath)., Disp: , Rfl:    clopidogrel (PLAVIX) 75 MG tablet, TAKE ONE TABLET BY MOUTH EVERY DAY, Disp: 90 tablet, Rfl: 2   diltiazem (CARDIZEM CD) 180 MG 24 hr capsule, TAKE ONE CAPSULE BY MOUTH EVERY DAY, Disp: 90 capsule, Rfl: 2   diphenhydrAMINE (BENADRYL) 25 mg capsule, Take 25 mg by mouth every 6 (six) hours as needed for itching (take with percocet)., Disp: , Rfl:    docusate sodium (COLACE) 100 MG capsule, Take 200 mg by mouth at bedtime. , Disp: , Rfl:    esomeprazole (NEXIUM) 40 MG capsule, Take 40 mg by mouth at bedtime. Pt only take as needed, Disp: , Rfl:    FLUoxetine (PROZAC) 20 MG capsule, Take by mouth., Disp: , Rfl:    FLUoxetine (PROZAC) 40 MG capsule, Take 40 mg by mouth daily. Take with '10mg'$  capsule for a total of '50mg'$  daily.,  Disp: , Rfl:    formoterol (PERFOROMIST) 20 MCG/2ML nebulizer solution, Take 20 mcg by nebulization as needed (shortness of breath)., Disp: , Rfl:    gabapentin (NEURONTIN) 300 MG capsule, Take 900 mg by mouth 3 (three) times daily., Disp: , Rfl:    glucose blood (TRUE METRIX BLOOD GLUCOSE TEST) test strip, , Disp: ,  Rfl:    insulin regular human CONCENTRATED (HUMULIN R) 500 UNIT/ML injection, Inject 90 Units into the skin 3 (three) times daily with meals., Disp: , Rfl:    isosorbide mononitrate (IMDUR) 60 MG 24 hr tablet, Take 1 tablet (60 mg total) by mouth daily., Disp: , Rfl:    linagliptin (TRADJENTA) 5 MG TABS tablet, Take 5 mg by mouth daily., Disp: , Rfl:    nitroGLYCERIN (NITROSTAT) 0.4 MG SL tablet, PLACE ONE tablet UNDER THE TONGUE every FIVE minutes as needed FOR CHEST PAIN. IF 3 TABLETS ARE NECESSARY CALL 911, Disp: 25 tablet, Rfl: 3   oxybutynin (DITROPAN-XL) 10 MG 24 hr tablet, Take 10 mg by mouth daily., Disp: , Rfl: 11   potassium chloride SA (K-DUR,KLOR-CON) 20 MEQ tablet, Take 20 mEq by mouth 2 (two) times daily. , Disp: , Rfl:    predniSONE (DELTASONE) 10 MG tablet, Take 4 tablets (40 mg total) by mouth daily with breakfast for 1 day, THEN 3 tablets (30 mg total) daily with breakfast for 1 day, THEN 2 tablets (20 mg total) daily with breakfast for 1 day, THEN 1 tablet (10 mg total) daily with breakfast for 1 day, THEN 0.5 tablets (5 mg total) daily with breakfast for 1 day., Disp: 10.5 tablet, Rfl: 0   pregabalin (LYRICA) 25 MG capsule, Take 25 mg by mouth 2 (two) times daily., Disp: , Rfl:    Probiotic Product (PROBIOTIC PO), Take 1 capsule by mouth daily., Disp: , Rfl:    tamsulosin (FLOMAX) 0.4 MG CAPS capsule, Take 0.4 mg by mouth daily. , Disp: , Rfl:    torsemide (DEMADEX) 20 MG tablet, TAKE THREE TABLETS BY MOUTH TWICE DAILY, Disp: 540 tablet, Rfl: 0   zolpidem (AMBIEN) 5 MG tablet, Take 5 mg by mouth at bedtime. For insomnia., Disp: , Rfl:    sucralfate (CARAFATE) 1 g  tablet, TAKE ONE TABLET BY MOUTH TWICE DAILY ON an empty stomach (Patient not taking: Reported on 02/26/2021), Disp: , Rfl:       Objective:   Vitals:   02/26/21 1503  BP: 128/74  Pulse: 91  Temp: 99.1 F (37.3 C)  TempSrc: Oral  SpO2: 98%  Weight: 219 lb 3.2 oz (99.4 kg)  Height: '5\' 8"'$  (1.727 m)    Estimated body mass index is 33.33 kg/m as calculated from the following:   Height as of this encounter: '5\' 8"'$  (1.727 m).   Weight as of this encounter: 219 lb 3.2 oz (99.4 kg).  '@WEIGHTCHANGE'$ @  Filed Weights   02/26/21 1503  Weight: 219 lb 3.2 oz (99.4 kg)     Physical Exam  General: No distress.  Looks more awake but still deconditioned Neuro: Alert and Oriented x 3. GCS 15. Speech normal Psych: Pleasant Resp:  Barrel Chest - yes.  Wheeze -chronic wheezing which I think is no baseline, Crackles - no, No overt respiratory distress CVS: Normal heart sounds. Murmurs - no Ext: Stigmata of Connective Tissue Disease - no HEENT: Normal upper airway. PEERL +. No post nasal drip        Assessment:       ICD-10-CM   1. Chronic respiratory failure with hypoxia ([name])  J96.11     2. Chronic obstructive pulmonary disease with acute exacerbation ([name])  J44.1     3. Other emphysema ([place])  J43.8     4. Tobacco abuse  Z72.0     5. Wheezing  R06.2          Plan:  Patient Instructions     ICD-10-CM   1. Chronic respiratory failure with hypoxia ([name])  J96.11     2. Chronic obstructive pulmonary disease with acute exacerbation ([name])  J44.1     3. Other emphysema ([place])  J43.8     4. Tobacco abuse  Z72.0     5. Wheezing  R06.2       On your latest CT scan there is no more evidence of interstitial lung disease/pulmonary fibrosis. Instead the dominant feature is emphysema  Currently might be in a bit of COPD flareup or emphysema flareup- but could just be that you have baseline wheezing  Overall shortness of breath is multifactorial and this includes emphysema,  weight, physical deconditioning and other reasons  No reason to explain from lungs to explain msk pain in your upper back   Plan -- Please take prednisone 40 mg x1 day, then 30 mg x1 day, then 20 mg x1 day, then 10 mg x1 day, and then 5 mg x1 day and stop --Continue oxygen as before -Change  Bevespi inhaler to BREZTRI inhaler daily =- Other prn nebulizers  as before -Re-referr  home palliative care   Follow-up -3 months or sooner if needed    SIGNATURE    Dr. Brand Males, M.D., F.C.C.P,  Pulmonary and Critical Care Medicine Staff Physician, [place] Director - Interstitial Lung Disease  Program  [facility] [place] [facility] [place], [place], [address]  Pager: [phone], If no answer or between  15:00h - 7:00h: call [phone] Telephone: [phone]  4:02 PM 02/26/2021

## 2021-02-26 NOTE — Patient Instructions (Addendum)
ICD-10-CM   1. Chronic respiratory failure with hypoxia ([name])  J96.11     2. Chronic obstructive pulmonary disease with acute exacerbation ([name])  J44.1     3. Other emphysema ([place])  J43.8     4. Tobacco abuse  Z72.0     5. Wheezing  R06.2       On your latest CT scan there is no more evidence of interstitial lung disease/pulmonary fibrosis. Instead the dominant feature is emphysema  Currently might be in a bit of COPD flareup or emphysema flareup- but could just be that you have baseline wheezing  Overall shortness of breath is multifactorial and this includes emphysema, weight, physical deconditioning and other reasons  No reason to explain from lungs to explain msk pain in your upper back   Plan -- Please take prednisone 40 mg x1 day, then 30 mg x1 day, then 20 mg x1 day, then 10 mg x1 day, and then 5 mg x1 day and stop --Continue oxygen as before -Change  Bevespi inhaler to BREZTRI inhaler daily =- Other prn nebulizers  as before -Re-referr  home palliative care   Follow-up -3 months or sooner if needed

## 2021-02-26 NOTE — Telephone Encounter (Signed)
-----   Message from Rivka Barbara sent at 02/26/2021  1:56 PM EDT ----- Regarding: [facility] [place] afternoon,   Patient states that he was here on 7/20 and left some paperwork with Dr. Posey Pronto about getting him some additional home health hours that was to be sent to [facility]. Can someone folllowup on this please? He forgot to ask Dr. Posey Pronto when he was here today.  Thank you.

## 2021-02-27 DIAGNOSIS — E1140 Type 2 diabetes mellitus with diabetic neuropathy, unspecified: Secondary | ICD-10-CM | POA: Diagnosis not present

## 2021-02-27 DIAGNOSIS — G8929 Other chronic pain: Secondary | ICD-10-CM | POA: Diagnosis not present

## 2021-02-27 DIAGNOSIS — G629 Polyneuropathy, unspecified: Secondary | ICD-10-CM | POA: Diagnosis not present

## 2021-02-27 DIAGNOSIS — J449 Chronic obstructive pulmonary disease, unspecified: Secondary | ICD-10-CM | POA: Diagnosis not present

## 2021-03-06 ENCOUNTER — Other Ambulatory Visit (HOSPITAL_COMMUNITY): Payer: Self-pay | Admitting: Internal Medicine

## 2021-03-06 ENCOUNTER — Encounter (HOSPITAL_BASED_OUTPATIENT_CLINIC_OR_DEPARTMENT_OTHER): Payer: Medicare Other | Attending: Internal Medicine | Admitting: Internal Medicine

## 2021-03-06 ENCOUNTER — Other Ambulatory Visit: Payer: Self-pay

## 2021-03-06 DIAGNOSIS — Z8249 Family history of ischemic heart disease and other diseases of the circulatory system: Secondary | ICD-10-CM | POA: Insufficient documentation

## 2021-03-06 DIAGNOSIS — N183 Chronic kidney disease, stage 3 unspecified: Secondary | ICD-10-CM | POA: Diagnosis not present

## 2021-03-06 DIAGNOSIS — E11621 Type 2 diabetes mellitus with foot ulcer: Secondary | ICD-10-CM

## 2021-03-06 DIAGNOSIS — E1122 Type 2 diabetes mellitus with diabetic chronic kidney disease: Secondary | ICD-10-CM | POA: Insufficient documentation

## 2021-03-06 DIAGNOSIS — X58XXXD Exposure to other specified factors, subsequent encounter: Secondary | ICD-10-CM | POA: Insufficient documentation

## 2021-03-06 DIAGNOSIS — Z833 Family history of diabetes mellitus: Secondary | ICD-10-CM | POA: Diagnosis not present

## 2021-03-06 DIAGNOSIS — E1169 Type 2 diabetes mellitus with other specified complication: Secondary | ICD-10-CM | POA: Diagnosis not present

## 2021-03-06 DIAGNOSIS — Z9049 Acquired absence of other specified parts of digestive tract: Secondary | ICD-10-CM | POA: Insufficient documentation

## 2021-03-06 DIAGNOSIS — Z8673 Personal history of transient ischemic attack (TIA), and cerebral infarction without residual deficits: Secondary | ICD-10-CM | POA: Diagnosis not present

## 2021-03-06 DIAGNOSIS — F17210 Nicotine dependence, cigarettes, uncomplicated: Secondary | ICD-10-CM | POA: Diagnosis not present

## 2021-03-06 DIAGNOSIS — E785 Hyperlipidemia, unspecified: Secondary | ICD-10-CM | POA: Insufficient documentation

## 2021-03-06 DIAGNOSIS — I130 Hypertensive heart and chronic kidney disease with heart failure and stage 1 through stage 4 chronic kidney disease, or unspecified chronic kidney disease: Secondary | ICD-10-CM | POA: Insufficient documentation

## 2021-03-06 DIAGNOSIS — J449 Chronic obstructive pulmonary disease, unspecified: Secondary | ICD-10-CM | POA: Diagnosis not present

## 2021-03-06 DIAGNOSIS — I2510 Atherosclerotic heart disease of native coronary artery without angina pectoris: Secondary | ICD-10-CM | POA: Insufficient documentation

## 2021-03-06 DIAGNOSIS — Z9981 Dependence on supplemental oxygen: Secondary | ICD-10-CM | POA: Insufficient documentation

## 2021-03-06 DIAGNOSIS — Z794 Long term (current) use of insulin: Secondary | ICD-10-CM | POA: Insufficient documentation

## 2021-03-06 DIAGNOSIS — M869 Osteomyelitis, unspecified: Secondary | ICD-10-CM | POA: Insufficient documentation

## 2021-03-06 DIAGNOSIS — S91302D Unspecified open wound, left foot, subsequent encounter: Secondary | ICD-10-CM | POA: Diagnosis not present

## 2021-03-06 DIAGNOSIS — Z85528 Personal history of other malignant neoplasm of kidney: Secondary | ICD-10-CM | POA: Insufficient documentation

## 2021-03-06 DIAGNOSIS — Z905 Acquired absence of kidney: Secondary | ICD-10-CM | POA: Insufficient documentation

## 2021-03-06 DIAGNOSIS — Z89412 Acquired absence of left great toe: Secondary | ICD-10-CM | POA: Diagnosis not present

## 2021-03-06 DIAGNOSIS — G4730 Sleep apnea, unspecified: Secondary | ICD-10-CM | POA: Diagnosis not present

## 2021-03-06 DIAGNOSIS — S91302A Unspecified open wound, left foot, initial encounter: Secondary | ICD-10-CM | POA: Diagnosis not present

## 2021-03-06 DIAGNOSIS — L97509 Non-pressure chronic ulcer of other part of unspecified foot with unspecified severity: Secondary | ICD-10-CM

## 2021-03-06 DIAGNOSIS — I5032 Chronic diastolic (congestive) heart failure: Secondary | ICD-10-CM | POA: Insufficient documentation

## 2021-03-06 NOTE — Progress Notes (Signed)
[name] ([id number]) , Visit Report for 03/06/2021 Abuse/Suicide Risk Screen Details Patient Name: Date of Service: [name], [name] 03/06/2021 2:45 PM Medical Record Number: [id number] Patient Account Number: [id number] Date of Birth/Sex: Treating RN: 03-Jul-1956 ([name]) Rhae Hammock Primary Care Dahlton Hinde: Sammuel Hines Other Clinician: Referring Charleston Vierling: Treating Mccabe Gloria/Extender: Seward Grater in Treatment: 0 Abuse/Suicide Risk Screen Items Answer ABUSE RISK SCREEN: Has anyone close to you tried to hurt or harm you recentlyo No Do you feel uncomfortable with anyone in your familyo No Has anyone forced you do things that you didnt want to doo No Electronic Signature(s) Signed: 03/06/2021 5:10:54 PM By: Rhae Hammock RN Entered By: Rhae Hammock on 03/06/2021 15:33:38 -------------------------------------------------------------------------------- Activities of Daily Living Details Patient Name: Date of Service: [name], [name] 03/06/2021 2:45 PM Medical Record Number: [id number] Patient Account Number: [id number] Date of Birth/Sex: Treating RN: 02-Mar-1956 ([name]) Rhae Hammock Primary Care Kariana Wiles: Sammuel Hines Other Clinician: Referring Solomia Harrell: Treating Ruqayyah Lute/Extender: Seward Grater in Treatment: 0 Activities of Daily Living Items Answer Activities of Daily Living (Please select one for each item) Drive Automobile Not Able T Medications ake Completely Able Use T elephone Completely Able Care for Appearance Need Assistance Use T oilet Completely Able Bath / Shower Need Assistance Dress Self Need Assistance Feed Self Completely Able Walk Need Assistance Get In / Out Bed Need Assistance Housework Need Assistance Prepare Meals Need Assistance Handle Money Need Assistance Shop for Self Need Assistance Electronic Signature(s) Signed: 03/06/2021 5:10:54 PM By: Rhae Hammock  RN Entered By: Rhae Hammock on 03/06/2021 15:34:14 -------------------------------------------------------------------------------- Education Screening Details Patient Name: Date of Service: [name] 03/06/2021 2:45 PM Medical Record Number: [id number] Patient Account Number: [id number] Date of Birth/Sex: Treating RN: 31-Jan-1956 (64 y.o. Male) Rhae Hammock Primary Care Alanta Scobey: Sammuel Hines Other Clinician: Referring Avleen Bordwell: Treating Erinn Huskins/Extender: Seward Grater in Treatment: 0 Primary Learner Assessed: Patient Learning Preferences/Education Level/Primary Language Learning Preference: Explanation, Demonstration, Communication Board, Printed Material Highest Education Level: High School Preferred Language: English Cognitive Barrier Language Barrier: No Translator Needed: No Memory Deficit: No Emotional Barrier: No Cultural/Religious Beliefs Affecting Medical Care: No Physical Barrier Impaired Vision: Yes Glasses, reading Impaired Hearing: No Decreased Hand dexterity: No Knowledge/Comprehension Knowledge Level: High Comprehension Level: High Ability to understand written instructions: High Ability to understand verbal instructions: High Motivation Anxiety Level: Calm Cooperation: Cooperative Education Importance: Denies Need Interest in Health Problems: Asks Questions Perception: Coherent Willingness to Engage in Self-Management High Activities: Readiness to Engage in Self-Management High Activities: Electronic Signature(s) Signed: 03/06/2021 5:10:54 PM By: Rhae Hammock RN Entered By: Rhae Hammock on 03/06/2021 15:34:39 -------------------------------------------------------------------------------- Fall Risk Assessment Details Patient Name: Date of Service: [name] 03/06/2021 2:45 PM Medical Record Number: [id number] Patient Account Number: [id number] Date of Birth/Sex: Treating RN: 06-02-1956 (64  y.o. Male) Rhae Hammock Primary Care Vashti Bolanos: Sammuel Hines Other Clinician: Referring Shellene Sweigert: Treating Ladislaus Repsher/Extender: Seward Grater in Treatment: 0 Fall Risk Assessment Items Have you had 2 or more falls in the last 12 monthso 0 No Have you had any fall that resulted in injury in the last 12 monthso 0 No FALLS RISK SCREEN History of falling - immediate or within 3 months 0 No Secondary diagnosis (Do you have 2 or more medical diagnoseso) 0 No Ambulatory aid None/bed rest/wheelchair/nurse 0 No Crutches/cane/walker 0 No Furniture 0 No Intravenous therapy Access/Saline/Heparin Lock 0 No Gait/Transferring Normal/ bed rest/ wheelchair 0 No Weak (short steps with or without  shuffle, stooped but able to lift head while walking, may seek 0 No support from furniture) Impaired (short steps with shuffle, may have difficulty arising from chair, head down, impaired 0 No balance) Mental Status Oriented to own ability 0 No Electronic Signature(s) Signed: 03/06/2021 5:10:54 PM By: Rhae Hammock RN Entered By: Rhae Hammock on 03/06/2021 15:34:45 -------------------------------------------------------------------------------- Foot Assessment Details Patient Name: Date of Service: [name] 03/06/2021 2:45 PM Medical Record Number: [id number] Patient Account Number: [id number] Date of Birth/Sex: Treating RN: 1956/06/24 ([name]) Rhae Hammock Primary Care Shemuel Harkleroad: Sammuel Hines Other Clinician: Referring Steffie Waggoner: Treating Cadan Maggart/Extender: Seward Grater in Treatment: 0 Foot Assessment Items Site Locations + = Sensation present, - = Sensation absent, C = Callus, U = Ulcer R = Redness, W = Warmth, M = Maceration, PU = Pre-ulcerative lesion F = Fissure, S = Swelling, D = Dryness Assessment Right: Left: Other Deformity: No No Prior Foot Ulcer: No No Prior Amputation: No Yes Charcot Joint: No  No Ambulatory Status: Ambulatory Without Help Gait: Steady Electronic Signature(s) Signed: 03/06/2021 5:10:54 PM By: Rhae Hammock RN Entered By: Rhae Hammock on 03/06/2021 15:42:44 -------------------------------------------------------------------------------- Nutrition Risk Screening Details Patient Name: Date of Service: [name] 03/06/2021 2:45 PM Medical Record Number: [id number] Patient Account Number: [id number] Date of Birth/Sex: Treating RN: 02-05-1956 (64 y.o. Male) Rhae Hammock Primary Care Demontre Padin: Sammuel Hines Other Clinician: Referring Dejanique Ruehl: Treating Chamia Schmutz/Extender: Seward Grater in Treatment: 0 Height (in): 72 Weight (lbs): 250 Body Mass Index (BMI): 33.9 Nutrition Risk Screening Items Score Screening NUTRITION RISK SCREEN: I have an illness or condition that made me change the kind and/or amount of food I eat 0 No I eat fewer than two meals per day 0 No I eat few fruits and vegetables, or milk products 0 No I have three or more drinks of beer, liquor or wine almost every day 0 No I have tooth or mouth problems that make it hard for me to eat 0 No I don't always have enough money to buy the food I need 0 No I eat alone most of the time 0 No I take three or more different prescribed or over-the-counter drugs a day 0 No Without wanting to, I have lost or gained 10 pounds in the last six months 0 No I am not always physically able to shop, cook and/or feed myself 0 No Nutrition Protocols Good Risk Protocol 0 No interventions needed Moderate Risk Protocol High Risk Proctocol Risk Level: Good Risk Score: 0 Electronic Signature(s) Signed: 03/06/2021 5:10:54 PM By: Rhae Hammock RN Entered By: Rhae Hammock on 03/06/2021 15:34:50

## 2021-03-06 NOTE — Progress Notes (Addendum)
[name] ([id number]) , Visit Report for 03/06/2021 Allergy List Details Patient Name: Date of Service: [name], [name] 03/06/2021 2:45 PM Medical Record Number: [id number] Patient Account Number: [id number] Date of Birth/Sex: Treating RN: 11/23/1955 ([name]) Rhae Hammock Primary Care Sahiti Joswick: Sammuel Hines Other Clinician: Referring Enriqueta Augusta: Treating Keshaun Dubey/Extender: Seward Grater in Treatment: 0 Allergies Active Allergies oxycodone lisinopril Allergy Notes Electronic Signature(s) Signed: 03/06/2021 5:10:54 PM By: Rhae Hammock RN Entered By: Rhae Hammock on 03/06/2021 15:30:13 -------------------------------------------------------------------------------- Arrival Information Details Patient Name: Date of Service: [name]. 03/06/2021 2:45 PM Medical Record Number: [id number] Patient Account Number: [id number] Date of Birth/Sex: Treating RN: Dec 10, 1955 ([name]) Rhae Hammock Primary Care Job Holtsclaw: Sammuel Hines Other Clinician: Referring Leeta Grimme: Treating Lewanda Perea/Extender: Seward Grater in Treatment: 0 Visit Information Patient Arrived: Ambulatory Arrival Time: 15:29 Accompanied By: mom Transfer Assistance: None Patient Identification Verified: Yes Secondary Verification Process Completed: Yes Patient Requires Transmission-Based Precautions: No Patient Has Alerts: Yes Patient Alerts: ABI's: R: 0.82 L:0.76 History Since Last Visit Added or deleted any medications: No Any new allergies or adverse reactions: No Had a fall or experienced change in activities of daily living that may affect risk of falls: No Signs or symptoms of abuse/neglect since last visito No Hospitalized since last visit: No Implantable device outside of the clinic excluding cellular tissue based products placed in the center since last visit: No Has Dressing in Place as Prescribed: Yes Electronic  Signature(s) Signed: 03/06/2021 5:10:54 PM By: Rhae Hammock RN Entered By: Rhae Hammock on 03/06/2021 15:29:44 -------------------------------------------------------------------------------- Clinic Level of Care Assessment Details Patient Name: Date of Service: [name], [name] 03/06/2021 2:45 PM Medical Record Number: [id number] Patient Account Number: [id number] Date of Birth/Sex: Treating RN: 08-06-1955 ([name]) Rhae Hammock Primary Care Everett Ehrler: Sammuel Hines Other Clinician: Referring Khamani Daniely: Treating Namiko Pritts/Extender: Seward Grater in Treatment: 0 Clinic Level of Care Assessment Items TOOL 4 Quantity Score X- 1 0 Use when only an EandM is performed on FOLLOW-UP visit ASSESSMENTS - Nursing Assessment / Reassessment X- 1 10 Reassessment of Co-morbidities (includes updates in patient status) X- 1 5 Reassessment of Adherence to Treatment Plan ASSESSMENTS - Wound and Skin A ssessment / Reassessment X - Simple Wound Assessment / Reassessment - one wound 1 5 '[]'$  - 0 Complex Wound Assessment / Reassessment - multiple wounds '[]'$  - 0 Dermatologic / Skin Assessment (not related to wound area) ASSESSMENTS - Focused Assessment X- 1 5 Circumferential Edema Measurements - multi extremities '[]'$  - 0 Nutritional Assessment / Counseling / Intervention '[]'$  - 0 Lower Extremity Assessment (monofilament, tuning fork, pulses) '[]'$  - 0 Peripheral Arterial Disease Assessment (using hand held doppler) ASSESSMENTS - Ostomy and/or Continence Assessment and Care '[]'$  - 0 Incontinence Assessment and Management '[]'$  - 0 Ostomy Care Assessment and Management (repouching, etc.) PROCESS - Coordination of Care X - Simple Patient / Family Education for ongoing care 1 15 '[]'$  - 0 Complex (extensive) Patient / Family Education for ongoing care X- 1 10 Staff obtains Programmer, systems, Records, T Results / Process Orders est '[]'$  - 0 Staff telephones HHA, Nursing Homes /  Clarify orders / etc '[]'$  - 0 Routine Transfer to another Facility (non-emergent condition) '[]'$  - 0 Routine Hospital Admission (non-emergent condition) '[]'$  - 0 New Admissions / Biomedical engineer / Ordering NPWT Apligraf, etc. , '[]'$  - 0 Emergency Hospital Admission (emergent condition) X- 1 10 Simple Discharge Coordination '[]'$  - 0 Complex (extensive) Discharge Coordination PROCESS - Special Needs '[]'$  -  0 Pediatric / Minor Patient Management '[]'$  - 0 Isolation Patient Management '[]'$  - 0 Hearing / Language / Visual special needs '[]'$  - 0 Assessment of Community assistance (transportation, D/C planning, etc.) '[]'$  - 0 Additional assistance / Altered mentation '[]'$  - 0 Support Surface(s) Assessment (bed, cushion, seat, etc.) INTERVENTIONS - Wound Cleansing / Measurement X - Simple Wound Cleansing - one wound 1 5 '[]'$  - 0 Complex Wound Cleansing - multiple wounds X- 1 5 Wound Imaging (photographs - any number of wounds) '[]'$  - 0 Wound Tracing (instead of photographs) X- 1 5 Simple Wound Measurement - one wound '[]'$  - 0 Complex Wound Measurement - multiple wounds INTERVENTIONS - Wound Dressings X - Small Wound Dressing one or multiple wounds 1 10 '[]'$  - 0 Medium Wound Dressing one or multiple wounds '[]'$  - 0 Large Wound Dressing one or multiple wounds '[]'$  - 0 Application of Medications - topical '[]'$  - 0 Application of Medications - injection INTERVENTIONS - Miscellaneous '[]'$  - 0 External ear exam '[]'$  - 0 Specimen Collection (cultures, biopsies, blood, body fluids, etc.) '[]'$  - 0 Specimen(s) / Culture(s) sent or taken to Lab for analysis '[]'$  - 0 Patient Transfer (multiple staff / Civil Service fast streamer / Similar devices) '[]'$  - 0 Simple Staple / Suture removal (25 or less) '[]'$  - 0 Complex Staple / Suture removal (26 or more) '[]'$  - 0 Hypo / Hyperglycemic Management (close monitor of Blood Glucose) '[]'$  - 0 Ankle / Brachial Index (ABI) - do not check if billed separately X- 1 5 Vital Signs Has the  patient been seen at the hospital within the last three years: Yes Total Score: 90 Level Of Care: New/Established - Level 3 Electronic Signature(s) Signed: 03/06/2021 5:10:54 PM By: Rhae Hammock RN Entered By: Rhae Hammock on 03/06/2021 16:03:47 -------------------------------------------------------------------------------- Encounter Discharge Information Details Patient Name: Date of Service: [name]. 03/06/2021 2:45 PM Medical Record Number: [id number] Patient Account Number: [id number] Date of Birth/Sex: Treating RN: Apr 18, 1956 ([name]) Rhae Hammock Primary Care Toriann Spadoni: Sammuel Hines Other Clinician: Referring Armenta Erskin: Treating Lanita Stammen/Extender: Seward Grater in Treatment: 0 Encounter Discharge Information Items Discharge Condition: Stable Ambulatory Status: Cane Discharge Destination: Home Transportation: Private Auto Accompanied By: SELF Schedule Follow-up Appointment: Yes Clinical Summary of Care: Patient Declined Electronic Signature(s) Signed: 03/06/2021 5:10:54 PM By: Rhae Hammock RN Entered By: Rhae Hammock on 03/06/2021 16:05:05 -------------------------------------------------------------------------------- Lower Extremity Assessment Details Patient Name: Date of Service: [name] 03/06/2021 2:45 PM Medical Record Number: [id number] Patient Account Number: [id number] Date of Birth/Sex: Treating RN: [birth year] (64 y.o. Male) Rhae Hammock Primary Care Starlena Beil: Sammuel Hines Other Clinician: Referring Lakitha Gordy: Treating Anea Fodera/Extender: Seward Grater in Treatment: 0 Edema Assessment Assessed: [name]: Yes] [Right: No] Edema: [Left: Ye] [Right: s] Calf Left: Right: Point of Measurement: 37 cm From Medial Instep 37 cm Ankle Left: Right: Point of Measurement: 8 cm From Medial Instep 23 cm Vascular Assessment Pulses: Dorsalis Pedis Palpable:  [Left:Yes] Posterior Tibial Palpable: [Left:Yes] Electronic Signature(s) Signed: 03/06/2021 5:10:54 PM By: Rhae Hammock RN Entered By: Rhae Hammock on 03/06/2021 15:41:48 -------------------------------------------------------------------------------- Multi Wound Chart Details Patient Name: Date of Service: [name]. 03/06/2021 2:45 PM Medical Record Number: [id number] Patient Account Number: [id number] Date of Birth/Sex: Treating RN: 03-22-1956 (64 y.o. Male) Rhae Hammock Primary Care Achaia Garlock: Sammuel Hines Other Clinician: Referring Tanara Turvey: Treating Pietra Zuluaga/Extender: Seward Grater in Treatment: 0 Vital Signs Height(in): Capillary Blood Glucose(mg/dl): 487 Weight(lbs): Pulse(bpm): 94 Body Mass Index(BMI): Blood Pressure(mmHg): 134/71 Temperature(F): 98 Respiratory  Rate(breaths/min): 17 Photos: [5:No Photos Left Amputation Site - Toe] [N/A:N/A N/A] Wound Location: [5:Surgical Injury] [N/A:N/A] Wounding Event: [5:Open Surgical Wound] [N/A:N/A] Primary Etiology: [5:Chronic Obstructive Pulmonary] [N/A:N/A] Comorbid History: [5:Disease (COPD), Sleep Apnea, Coronary Artery Disease, Hypertension, Type II Diabetes 12/27/2020] [N/A:N/A] Date Acquired: [5:0] [N/A:N/A] Weeks of Treatment: [5:Open] [N/A:N/A] Wound Status: [5:1x0.9x1.3] [N/A:N/A] Measurements L x W x D (cm) [5:0.707] [N/A:N/A] A (cm) : rea [5:0.919] [N/A:N/A] Volume (cm) : [5:0.00%] [N/A:N/A] % Reduction in A rea: [5:0.00%] [N/A:N/A] % Reduction in Volume: [5:12] Starting Position 1 (o'clock): [5:12] Ending Position 1 (o'clock): [5:0.8] Maximum Distance 1 (cm): [5:Yes] [N/A:N/A] Undermining: [5:Full Thickness Without Exposed] [N/A:N/A] Classification: [5:Support Structures Medium] [N/A:N/A] Exudate Amount: [5:Serosanguineous] [N/A:N/A] Exudate Type: [5:red, brown] [N/A:N/A] Exudate Color: [5:Distinct, outline attached] [N/A:N/A] Wound Margin: [5:Small (1-33%)]  [N/A:N/A] Granulation Amount: [5:Red, Pink] [N/A:N/A] Granulation Quality: [5:Large (67-100%)] [N/A:N/A] Necrotic Amount: [5:Fascia: No] [N/A:N/A] Exposed Structures: [5:Fat Layer (Subcutaneous Tissue): No Tendon: No Muscle: No Joint: No Bone: No Small (1-33%)] [N/A:N/A] Treatment Notes Wound #5 (Amputation Site - Toe) Wound Laterality: Left Cleanser Soap and Water Discharge Instruction: May shower and wash wound with dial antibacterial soap and water prior to dressing change. Wound Cleanser Discharge Instruction: Cleanse the wound with wound cleanser prior to applying a clean dressing using gauze sponges, not tissue or cotton balls. Peri-Wound Care Topical Primary Dressing KerraCel Ag Gelling Fiber Dressing, 4x5 in (silver alginate) Discharge Instruction: Apply silver alginate to wound bed as instructed Secondary Dressing Woven Gauze Sponge, Non-Sterile 4x4 in Discharge Instruction: Apply over primary dressing as directed. ABD Pad, 5x9 Discharge Instruction: Apply over primary dressing as directed. Optifoam Non-Adhesive Dressing, 4x4 in Discharge Instruction: Apply over primary dressing as directed. Secured With [employer], 4.5x3.1 (in/yd) Discharge Instruction: Secure with Kerlix as directed. 21M Medipore H Soft Cloth Surgical T 4 x 2 (in/yd) ape Discharge Instruction: Secure dressing with tape as directed. Compression Wrap Compression Stockings Add-Ons Electronic Signature(s) Signed: 03/07/2021 12:22:12 PM By: Kalman Shan DO Signed: 03/11/2021 5:31:05 PM By: Rhae Hammock RN Entered By: Kalman Shan on 03/07/2021 12:12:07 -------------------------------------------------------------------------------- Multi-Disciplinary Care Plan Details Patient Name: Date of Service: [name]. 03/06/2021 2:45 PM Medical Record Number: [id number] Patient Account Number: [id number] Date of Birth/Sex: Treating RN: 1956-02-03 (64 y.o. Male) Rhae Hammock Primary Care Oluwatosin Higginson: Sammuel Hines Other Clinician: Referring Jariana Shumard: Treating Tarshia Kot/Extender: Seward Grater in Treatment: 0 Active Inactive Orientation to the Wound Care Program Nursing Diagnoses: Knowledge deficit related to the wound healing center program Goals: Patient/caregiver will verbalize understanding of the [facility] Date Initiated: 03/06/2021 Target Resolution Date: 03/26/2021 Goal Status: Active Interventions: Provide education on orientation to the wound center Notes: Wound/Skin Impairment Nursing Diagnoses: Impaired tissue integrity Knowledge deficit related to ulceration/compromised skin integrity Goals: Patient will have a decrease in wound volume by X% from date: (specify in notes) Date Initiated: 03/06/2021 Target Resolution Date: 03/26/2021 Goal Status: Active Patient/caregiver will verbalize understanding of skin care regimen Date Initiated: 03/06/2021 Target Resolution Date: 03/26/2021 Goal Status: Active Ulcer/skin breakdown will have a volume reduction of 30% by week 4 Date Initiated: 03/06/2021 Target Resolution Date: 03/26/2021 Goal Status: Active Interventions: Assess patient/caregiver ability to obtain necessary supplies Assess patient/caregiver ability to perform ulcer/skin care regimen upon admission and as needed Assess ulceration(s) every visit Notes: Electronic Signature(s) Signed: 03/06/2021 5:10:54 PM By: Rhae Hammock RN Entered By: Rhae Hammock on 03/06/2021 15:57:05 -------------------------------------------------------------------------------- Pain Assessment Details Patient Name: Date of Service: [name]. 03/06/2021 2:45 PM Medical Record Number:  [id number] Patient Account Number: [id number] Date of Birth/Sex: Treating RN: 1956-01-19 ([name]) Rhae Hammock Primary Care Patrice Moates: Sammuel Hines Other Clinician: Referring Dartanian Knaggs: Treating  Alyan Hartline/Extender: Seward Grater in Treatment: 0 Active Problems Location of Pain Severity and Description of Pain Patient Has Paino No Site Locations Pain Management and Medication Current Pain Management: Electronic Signature(s) Signed: 03/06/2021 5:10:54 PM By: Rhae Hammock RN Entered By: Rhae Hammock on 03/06/2021 15:35:00 -------------------------------------------------------------------------------- Patient/Caregiver Education Details Patient Name: Date of Service: [name] 8/18/2022andnbsp2:45 PM Medical Record Number: [id number] Patient Account Number: [id number] Date of Birth/Gender: Treating RN: 1955-08-15 ([name]) Rhae Hammock Primary Care Physician: Sammuel Hines Other Clinician: Referring Physician: Treating Physician/Extender: Seward Grater in Treatment: 0 Education Assessment Education Provided To: Patient Education Topics Provided [place]: o Methods: Explain/Verbal Responses: State content correctly Electronic Signature(s) Signed: 03/06/2021 5:10:54 PM By: Rhae Hammock RN Entered By: Rhae Hammock on 03/06/2021 15:57:14 -------------------------------------------------------------------------------- Wound Assessment Details Patient Name: Date of Service: [name] 03/06/2021 2:45 PM Medical Record Number: [id number] Patient Account Number: [id number] Date of Birth/Sex: Treating RN: 19-Dec-1955 (64 y.o. Male) Rhae Hammock Primary Care Colbey Wirtanen: Sammuel Hines Other Clinician: Referring Gottfried Standish: Treating Rim Thatch/Extender: Seward Grater in Treatment: 0 Wound Status Wound Number: 5 Primary Open Surgical Wound Etiology: Wound Location: Left Amputation Site - Toe Wound Open Wounding Event: Surgical Injury Status: Date Acquired: 12/27/2020 Comorbid Chronic Obstructive Pulmonary Disease (COPD), Sleep  Apnea, Weeks Of Treatment: 0 History: Coronary Artery Disease, Hypertension, Type II Diabetes Clustered Wound: No Wound Measurements Length: (cm) 1 Width: (cm) 0.9 Depth: (cm) 1.3 Area: (cm) 0.707 Volume: (cm) 0.919 % Reduction in Area: 0% % Reduction in Volume: 0% Epithelialization: Small (1-33%) Tunneling: No Undermining: Yes Starting Position (o'clock): 12 Ending Position (o'clock): 12 Maximum Distance: (cm) 0.8 Wound Description Classification: Full Thickness Without Exposed Support Structures Wound Margin: Distinct, outline attached Exudate Amount: Medium Exudate Type: Serosanguineous Exudate Color: red, brown Foul Odor After Cleansing: No Slough/Fibrino Yes Wound Bed Granulation Amount: Small (1-33%) Exposed Structure Granulation Quality: Red, Pink Fascia Exposed: No Necrotic Amount: Large (67-100%) Fat Layer (Subcutaneous Tissue) Exposed: No Necrotic Quality: Adherent Slough Tendon Exposed: No Muscle Exposed: No Joint Exposed: No Bone Exposed: No Electronic Signature(s) Signed: 03/06/2021 5:10:54 PM By: Rhae Hammock RN Entered By: Rhae Hammock on 03/06/2021 15:46:58 -------------------------------------------------------------------------------- Vitals Details Patient Name: Date of Service: [name]. 03/06/2021 2:45 PM Medical Record Number: [id number] Patient Account Number: [id number] Date of Birth/Sex: Treating RN: 1956-03-28 (64 y.o. Male) Rhae Hammock Primary Care Britanni Yarde: Sammuel Hines Other Clinician: Referring Mikailah Morel: Treating Shaylea Ucci/Extender: Seward Grater in Treatment: 0 Vital Signs Time Taken: 15:36 Temperature (F): 98 Pulse (bpm): 94 Respiratory Rate (breaths/min): 17 Blood Pressure (mmHg): 134/71 Capillary Blood Glucose (mg/dl): 487 Reference Range: 80 - 120 mg / dl Electronic Signature(s) Signed: 03/06/2021 5:10:54 PM By: Rhae Hammock RN Entered By: Rhae Hammock on  03/06/2021 15:36:45

## 2021-03-07 DIAGNOSIS — S91109A Unspecified open wound of unspecified toe(s) without damage to nail, initial encounter: Secondary | ICD-10-CM | POA: Diagnosis not present

## 2021-03-07 NOTE — Progress Notes (Signed)
[name] ([id number]) , Visit Report for 03/06/2021 Chief Complaint Document Details Patient Name: Date of Service: Kevin, [name] 03/06/2021 2:45 PM Medical Record Number: [id number] Patient Account Number: [id number] Date of Birth/Sex: Treating RN: [birth year] ([name]) Rhae Hammock Primary Care Provider: Sammuel Hines Other Clinician: Referring Provider: Treating Provider/Extender: Seward Grater in Treatment: 0 Information Obtained from: Patient Chief Complaint non healing to the left great toe surgical wound site s/p amputation Electronic Signature(s) Signed: 03/07/2021 12:22:12 PM By: Kalman Shan DO Entered By: Kalman Shan on 03/07/2021 12:12:34 -------------------------------------------------------------------------------- HPI Details Patient Name: Date of Service: [name]. 03/06/2021 2:45 PM Medical Record Number: [id number] Patient Account Number: [id number] Date of Birth/Sex: Treating RN: 06/25/1956 ([name]) Rhae Hammock Primary Care Provider: Sammuel Hines Other Clinician: Referring Provider: Treating Provider/Extender: Seward Grater in Treatment: 0 History of Present Illness HPI Description: Admission 8/18 [name] is a 65 year old male with a past medical history of uncontrolled insulin-dependent type 2 diabetes, COPD On O2 6 L via nasal cannula That presents to the clinic for a 62-monthhistory of nonhealing wound to the left great toe amputation site. He has been following with podiatry for a wound to his left great toe that developed osteomyelitis and subsequently required amputation. He had the surgery done on 6/10. He had a partial first ray amputation by Dr. PPosey Pronto Since his surgery he has been placing iodine on the wound bed. He states that there has been no improvement to the wound healing in the past 2 months. He states he has occasional intermittent pain to the  area. He denies systemic signs of infection. Electronic Signature(s) Signed: 03/07/2021 12:22:12 PM By: HKalman ShanDO Entered By: HKalman Shanon 03/07/2021 12:15:28 -------------------------------------------------------------------------------- Physical Exam Details Patient Name: Date of Service: [name]8/18/2022 2:45 PM Medical Record Number: [id number] Account Number: [id number] of Birth/Sex: Treating RN: 106-20-1957([name]) BRhae HammockPrimary Care Provider: WSammuel HinesOther Clinician: Referring Provider: Treating Provider/Extender: HSeward Graterin Treatment: 0 Constitutional respirations regular, non-labored and within target range for patient.. Cardiovascular 2+ dorsalis pedis/posterior tibialis pulses. Psychiatric pleasant and cooperative. Notes Left foot: T the left great toe amputation site there is an open wound with mild odor and nonviable tissue. Appears to probe to bone. Surrounding skin is o macerated but with no signs of infection. Electronic Signature(s) Signed: 03/07/2021 12:22:12 PM By: HKalman ShanDO Entered By: HKalman Shanon 03/07/2021 12:19:54 -------------------------------------------------------------------------------- Physician Orders Details Patient Name: Date of Service: [name] 03/06/2021 2:45 PM Medical Record Number: [id number] Account Number: [id number] of Birth/Sex: Treating RN: 123-Nov-1957([name]) BRhae HammockPrimary Care Provider: WSammuel HinesOther Clinician: Referring Provider: Treating Provider/Extender: HSeward Graterin Treatment: 0 Verbal / Phone Orders: No Diagnosis Coding ICD-10 Coding Code Description E11.621 Type 2 diabetes mellitus with foot ulcer S91.302A Unspecified open wound, left foot, initial encounter J44.9 Chronic obstructive pulmonary disease, unspecified Follow-up  Appointments ppointment in 1 week. - Dr. HHeber CarolinaReturn A Bathing/ Shower/ Hygiene May shower with protection but do not get wound dressing(s) wet. - May use a cast protector to protect dressing in shower Edema Control - Lymphedema / SCD / Other Elevate legs to the level of the heart or above for 30 minutes daily and/or when sitting, a frequency of: Avoid standing for long periods of time. Wound Treatment Wound #5 - Amputation Site - Toe Wound Laterality: Left  Cleanser: Soap and Water 1 x Per Day/15 Days Discharge Instructions: May shower and wash wound with dial antibacterial soap and water prior to dressing change. Cleanser: Wound Cleanser (DME) (Generic) 1 x Per Day/15 Days Discharge Instructions: Cleanse the wound with wound cleanser prior to applying a clean dressing using gauze sponges, not tissue or cotton balls. Prim Dressing: KerraCel Ag Gelling Fiber Dressing, 4x5 in (silver alginate) (DME) (Generic) 1 x Per Day/15 Days ary Discharge Instructions: Apply silver alginate to wound bed as instructed Secondary Dressing: Woven Gauze Sponge, Non-Sterile 4x4 in (DME) (Generic) 1 x Per Day/15 Days Discharge Instructions: Apply over primary dressing as directed. Secondary Dressing: ABD Pad, 5x9 (DME) (Generic) 1 x Per Day/15 Days Discharge Instructions: Apply over primary dressing as directed. Secondary Dressing: Optifoam Non-Adhesive Dressing, 4x4 in (DME) (Generic) 1 x Per Day/15 Days Discharge Instructions: Apply over primary dressing as directed. Secured With: [employer], 4.5x3.1 (in/yd) (DME) (Generic) 1 x Per Day/15 Days Discharge Instructions: Secure with Kerlix as directed. Secured With: 29M Medipore H Soft Cloth Surgical T 4 x 2 (in/yd) (DME) (Generic) 1 x Per Day/15 Days ape Discharge Instructions: Secure dressing with tape as directed. Radiology MRI, lower extremity without contrast - LEFT FOOT WITHOUT CONTRAST ICD 10: CPT ; ; : Electronic Signature(s) Signed:  03/07/2021 12:22:12 PM By: Kalman Shan DO Previous Signature: 03/06/2021 5:10:54 PM Version By: Rhae Hammock RN Entered By: Kalman Shan on 03/07/2021 12:20:06 Prescription 03/06/2021 [facility] Monica Martinez -------------------------------------------------------------------------------- Kalman Shan DO Patient Name: Provider: 04/06/1956 [id number] Date of Birth: NPI#: [id number] Sex: DEA #: [phone] Phone #: License #: [place] Patient Address: [facility] [address] [place], [place] [address] [place], [place] [address] [phone] Allergies oxycodone; lisinopril Provider's Orders MRI, lower extremity without contrast - LEFT FOOT WITHOUT CONTRAST ICD 10: CPT ; ; : Hand Signature: Date(s): Electronic Signature(s) Signed: 03/07/2021 12:22:12 PM By: Kalman Shan DO Previous Signature: 03/06/2021 5:10:54 PM Version By: Rhae Hammock RN Entered By: Kalman Shan on 03/07/2021 12:20:07 -------------------------------------------------------------------------------- Problem List Details Patient Name: Date of Service: [name]. 03/06/2021 2:45 PM Medical Record Number: [id number] Patient Account Number: [id number] Date of Birth/Sex: Treating RN: 14-Sep-1955 (64 y.o. Male) Rhae Hammock Primary Care Provider: Sammuel Hines Other Clinician: Referring Provider: Treating Provider/Extender: Seward Grater in Treatment: 0 Active Problems ICD-10 Encounter Code Description Active Date MDM Diagnosis E11.621 Type 2 diabetes mellitus with foot ulcer 03/06/2021 No Yes S91.302A Unspecified open wound, left foot, initial encounter 03/06/2021 No Yes J44.9 Chronic obstructive pulmonary disease, unspecified 03/06/2021 No Yes Inactive Problems Resolved Problems Electronic Signature(s) Signed: 03/07/2021 12:22:12 PM By: Kalman Shan DO Entered By: Kalman Shan on 03/07/2021 12:12:02 -------------------------------------------------------------------------------- Progress Note Details Patient Name: Date of Service: [name] 03/06/2021 2:45 PM Medical Record Number: [id number] Patient Account Number: [id number] Date of Birth/Sex: Treating RN: 1955/12/29 (64 y.o. Male) Rhae Hammock Primary Care Provider: Sammuel Hines Other Clinician: Referring Provider: Treating Provider/Extender: Seward Grater in Treatment: 0 Subjective Chief Complaint Information obtained from Patient non healing to the left great toe surgical wound site s/p amputation History of Present Illness (HPI) Admission 8/18 Mr. Nahzir Schult is a 65 year old male with a past medical history of uncontrolled insulin-dependent type 2 diabetes, COPD On O2 6 L via nasal cannula That presents to the clinic for a 33-monthhistory of nonhealing wound to the left great toe amputation site. He has been following with  podiatry for a wound to his left great toe that developed osteomyelitis and subsequently required amputation. He had the surgery done on 6/10. He had a partial first ray amputation by Dr. Posey Pronto. Since his surgery he has been placing iodine on the wound bed. He states that there has been no improvement to the wound healing in the past 2 months. He states he has occasional intermittent pain to the area. He denies systemic signs of infection. Patient History Information obtained from Patient. Allergies oxycodone, lisinopril Family History Cancer - Maternal Grandparents,Paternal Grandparents,Father,Mother, Diabetes - Mother,Father, Heart Disease - Maternal Grandparents,Mother,Father, Kidney Disease - Maternal Grandparents, Lung Disease - Father, Stroke - Paternal Grandparents,Maternal Grandparents,Mother, No family history of Hereditary Spherocytosis, Hypertension, Seizures, Thyroid Problems, Tuberculosis. Social History Current every day  smoker - 1/2 pack a day, Alcohol Use - Never, Drug Use - No History, Caffeine Use - Daily - coffee, tea. Medical History Respiratory Patient has history of Chronic Obstructive Pulmonary Disease (COPD), Sleep Apnea - does not use CPAP Cardiovascular Patient has history of Coronary Artery Disease - mx vessel, Hypertension Gastrointestinal Denies history of Cirrhosis , Colitis, Crohnoos, Hepatitis A, Hepatitis B, Hepatitis C Endocrine Patient has history of Type II Diabetes - last A1c - 13.3 Hospitalization/Surgery History - appendectomy. - cholecystectomy. - laproscopic nephrectomy. - lapro lysis of lesions. - thorascopy. - lung biopsy. - bronchoscopy. - (L) heart cath with angio X3. - endarcdectomy. - patch angioplasty. - left great toe amp. December 27, 2020. Medical A Surgical History Notes nd Constitutional Symptoms (General Health) noncompliance , cocaine abuse ,hepatitis ,headaches Respiratory h/o pneumonia , end stage chronic lung disease , COPD/ILD Cardiovascular h/o CVA , hyperlipidemia , coronary atherosclerosis , acute on chronic diastolic heart failure LVEF 60%-65% Gastrointestinal cholecystitis , GERD Genitourinary h/o nephrolithiasis , h/o renal cell carcinoma , CKD Stage 3 Musculoskeletal arthritis Psychiatric anxiety , depression Review of Systems (ROS) Constitutional Symptoms (General Health) Denies complaints or symptoms of Fatigue, Fever, Chills, Marked Weight Change. Eyes Denies complaints or symptoms of Dry Eyes, Vision Changes, Glasses / Contacts. Ear/Nose/Mouth/Throat Denies complaints or symptoms of Chronic sinus problems or rhinitis. Gastrointestinal Denies complaints or symptoms of Frequent diarrhea, Nausea, Vomiting. Objective Constitutional respirations regular, non-labored and within target range for patient.. Vitals Time Taken: 3:36 PM, Temperature: 98 F, Pulse: 94 bpm, Respiratory Rate: 17 breaths/min, Blood Pressure: 134/71 mmHg, Capillary Blood  Glucose: 487 mg/dl. Cardiovascular 2+ dorsalis pedis/posterior tibialis pulses. Psychiatric pleasant and cooperative. General Notes: Left foot: T the left great toe amputation site there is an open wound with mild odor and nonviable tissue. Appears to probe to bone. o Surrounding skin is macerated but with no signs of infection. Integumentary (Hair, Skin) Wound #5 status is Open. Original cause of wound was Surgical Injury. The date acquired was: 12/27/2020. The wound is located on the Left Amputation Site - T The wound measures 1cm length x 0.9cm width x 1.3cm depth; 0.707cm^2 area and 0.919cm^3 volume. There is no tunneling noted, however, there is oe. undermining starting at 12:00 and ending at 12:00 with a maximum distance of 0.8cm. There is a medium amount of serosanguineous drainage noted. The wound margin is distinct with the outline attached to the wound base. There is small (1-33%) red, pink granulation within the wound bed. There is a large (67- 100%) amount of necrotic tissue within the wound bed including Adherent Slough. Assessment Active Problems ICD-10 Type 2 diabetes mellitus with foot ulcer Unspecified open wound, left foot, initial encounter Chronic obstructive pulmonary disease, unspecified  Patient presents with a 47-monthhistory of nonhealing wound to the previous surgical amputation site of the left great toe secondary to osteomylitis. I was concerned that I feel bone on exam. I would like to get an MRI of the left foot Is unconcerned he still has osteomyelitis. If he does he may need further amputation versus IV antibiotics. There is maceration noted to the surrounding wound bed and I recommended switching the dressing from iodine to silver alginate. I recommended he do daily dressing changes. No obvious signs of infection on exam. Follow-up in 1 week Plan Follow-up Appointments: Return Appointment in 1 week. - Dr. HHeber CarolinaBathing/ Shower/ Hygiene: May shower with  protection but do not get wound dressing(s) wet. - May use a cast protector to protect dressing in shower Edema Control - Lymphedema / SCD / Other: Elevate legs to the level of the heart or above for 30 minutes daily and/or when sitting, a frequency of: Avoid standing for long periods of time. Radiology ordered were: MRI, lower extremity without contrast - LEFT FOOT WITHOUT CONTRAST ICD 10: CPT ; ; : WOUND #5: - Amputation Site - T oe Wound Laterality: Left Cleanser: Soap and Water 1 x Per [id number] Discharge Instructions: May shower and wash wound with dial antibacterial soap and water prior to dressing change. Cleanser: Wound Cleanser (DME) (Generic) 1 x Per Day/15 Days Discharge Instructions: Cleanse the wound with wound cleanser prior to applying a clean dressing using gauze sponges, not tissue or cotton balls. Prim Dressing: KerraCel Ag Gelling Fiber Dressing, 4x5 in (silver alginate) (DME) (Generic) 1 x Per Day/15 Days ary Discharge Instructions: Apply silver alginate to wound bed as instructed Secondary Dressing: Woven Gauze Sponge, Non-Sterile 4x4 in (DME) (Generic) 1 x Per Day/15 Days Discharge Instructions: Apply over primary dressing as directed. Secondary Dressing: ABD Pad, 5x9 (DME) (Generic) 1 x Per Day/15 Days Discharge Instructions: Apply over primary dressing as directed. Secondary Dressing: Optifoam Non-Adhesive Dressing, 4x4 in (DME) (Generic) 1 x Per Day/15 Days Discharge Instructions: Apply over primary dressing as directed. Secured With: [employer] 4.5x3.1 (in/yd) (DME) (Generic) 1 x Per Day/15 Days Discharge Instructions: Secure with Kerlix as directed. Secured With: 68M Medipore H Soft Cloth Surgical T 4 x 2 (in/yd) (DME) (Generic) 1 x Per Day/15 Days ape Discharge Instructions: Secure dressing with tape as directed. 1. MRI of the left foot 2. Silver alginate 3. Follow-up in 1 week Electronic Signature(s) Signed: 03/07/2021 12:22:12 PM By: HKalman ShanDO Entered By: HKalman Shanon 03/07/2021 12:21:44 -------------------------------------------------------------------------------- HxROS Details Patient Name: Date of Service: [name] 03/06/2021 2:45 PM Medical Record Number: [id number] Account Number: [id number] of Birth/Sex: Treating RN: 1Jan 04, 1957(64 y.o. Male) BRhae HammockPrimary Care Provider: WSammuel HinesOther Clinician: Referring Provider: Treating Provider/Extender: HSeward Graterin Treatment: 0 Information Obtained From Patient Constitutional Symptoms (General Health) Complaints and Symptoms: Negative for: Fatigue; Fever; Chills; Marked Weight Change Medical History: Past Medical History Notes: noncompliance , cocaine abuse ,hepatitis ,headaches Eyes Complaints and Symptoms: Negative for: Dry Eyes; Vision Changes; Glasses / Contacts Ear/Nose/Mouth/Throat Complaints and Symptoms: Negative for: Chronic sinus problems or rhinitis Gastrointestinal Complaints and Symptoms: Negative for: Frequent diarrhea; Nausea; Vomiting Medical History: Negative for: Cirrhosis ; Colitis; Crohns; Hepatitis A; Hepatitis B; Hepatitis C Past Medical History Notes: cholecystitis , GERD Hematologic/Lymphatic Respiratory Medical History: Positive for: Chronic Obstructive Pulmonary Disease (COPD); Sleep Apnea - does not use CPAP Past Medical History Notes: h/o pneumonia , end stage chronic  lung disease , COPD/ILD Cardiovascular Medical History: Positive for: Coronary Artery Disease - mx vessel; Hypertension Past Medical History Notes: h/o CVA , hyperlipidemia , coronary atherosclerosis , acute on chronic diastolic heart failure LVEF 60%-65% Endocrine Medical History: Positive for: Type II Diabetes - last A1c - 13.3 Genitourinary Medical History: Past Medical History Notes: h/o nephrolithiasis , h/o renal cell carcinoma , CKD Stage 3 Musculoskeletal Medical  History: Past Medical History Notes: arthritis Psychiatric Medical History: Past Medical History Notes: anxiety , depression Immunizations Pneumococcal Vaccine: Received Pneumococcal Vaccination: Yes Received Pneumococcal Vaccination On or After 60th Birthday: Yes Implantable Devices None Hospitalization / Surgery History Type of Hospitalization/Surgery appendectomy cholecystectomy laproscopic nephrectomy lapro lysis of lesions thorascopy lung biopsy bronchoscopy (L) heart cath with angio X3 endarcdectomy patch angioplasty left great toe amp. December 27, 2020 Family and Social History Cancer: Yes - Maternal Grandparents,Paternal Grandparents,Father,Mother; Diabetes: Yes - Mother,Father; Heart Disease: Yes - Maternal Grandparents,Mother,Father; Hereditary Spherocytosis: No; Hypertension: No; Kidney Disease: Yes - Maternal Grandparents; Lung Disease: Yes - Father; Seizures: No; Stroke: Yes - Paternal Grandparents,Maternal Grandparents,Mother; Thyroid Problems: No; Tuberculosis: No; Current every day smoker - 1/2 pack a day; Alcohol Use: Never; Drug Use: No History; Caffeine Use: Daily - coffee, tea; Financial Concerns: No; Food, Clothing or Shelter Needs: No; Support System Lacking: No; Transportation Concerns: No Electronic Signature(s) Signed: 03/06/2021 5:10:54 PM By: Rhae Hammock RN Signed: 03/07/2021 12:22:12 PM By: Kalman Shan DO Entered By: Rhae Hammock on 03/06/2021 15:35:28 -------------------------------------------------------------------------------- SuperBill Details Patient Name: Date of Service: [name] 03/06/2021 Medical Record Number: [id number] Patient Account Number: [id number] Date of Birth/Sex: Treating RN: 03-31-1956 (64 y.o. Male) Rhae Hammock Primary Care Provider: Sammuel Hines Other Clinician: Referring Provider: Treating Provider/Extender: Seward Grater in Treatment: 0 Diagnosis Coding ICD-10  Codes Code Description [id number] Type 2 diabetes mellitus with foot ulcer S91.302A Unspecified open wound, left foot, initial encounter J44.9 Chronic obstructive pulmonary disease, unspecified Facility Procedures CPT4 Code: [id number] Description: 99213 - WOUND CARE VISIT-LEV 3 EST PT Modifier: Quantity: 1 Physician Procedures : CPT4 Code Description Modifier [id number] - WC PHYS LEVEL 4 - NEW PT ICD-10 Diagnosis Description E11.621 Type 2 diabetes mellitus with foot ulcer S91.302A Unspecified open wound, left foot, initial encounter J44.9 Chronic obstructive pulmonary  disease, unspecified Quantity: 1 Electronic Signature(s) Signed: 03/07/2021 12:22:12 PM By: Kalman Shan DO Previous Signature: 03/06/2021 5:10:54 PM Version By: Rhae Hammock RN Entered By: Kalman Shan on 03/07/2021 12:21:53

## 2021-03-11 DIAGNOSIS — S91109A Unspecified open wound of unspecified toe(s) without damage to nail, initial encounter: Secondary | ICD-10-CM | POA: Diagnosis not present

## 2021-03-13 ENCOUNTER — Encounter (HOSPITAL_BASED_OUTPATIENT_CLINIC_OR_DEPARTMENT_OTHER): Payer: Medicare Other | Admitting: Internal Medicine

## 2021-03-13 ENCOUNTER — Other Ambulatory Visit: Payer: Self-pay

## 2021-03-13 DIAGNOSIS — Z905 Acquired absence of kidney: Secondary | ICD-10-CM | POA: Diagnosis not present

## 2021-03-13 DIAGNOSIS — Z89412 Acquired absence of left great toe: Secondary | ICD-10-CM | POA: Diagnosis not present

## 2021-03-13 DIAGNOSIS — M869 Osteomyelitis, unspecified: Secondary | ICD-10-CM | POA: Diagnosis not present

## 2021-03-13 DIAGNOSIS — F17210 Nicotine dependence, cigarettes, uncomplicated: Secondary | ICD-10-CM | POA: Diagnosis not present

## 2021-03-13 DIAGNOSIS — S91302D Unspecified open wound, left foot, subsequent encounter: Secondary | ICD-10-CM

## 2021-03-13 DIAGNOSIS — E785 Hyperlipidemia, unspecified: Secondary | ICD-10-CM | POA: Diagnosis not present

## 2021-03-13 DIAGNOSIS — E11621 Type 2 diabetes mellitus with foot ulcer: Secondary | ICD-10-CM | POA: Diagnosis not present

## 2021-03-13 DIAGNOSIS — Z9981 Dependence on supplemental oxygen: Secondary | ICD-10-CM | POA: Diagnosis not present

## 2021-03-13 DIAGNOSIS — Z8249 Family history of ischemic heart disease and other diseases of the circulatory system: Secondary | ICD-10-CM | POA: Diagnosis not present

## 2021-03-13 DIAGNOSIS — I130 Hypertensive heart and chronic kidney disease with heart failure and stage 1 through stage 4 chronic kidney disease, or unspecified chronic kidney disease: Secondary | ICD-10-CM | POA: Diagnosis not present

## 2021-03-13 DIAGNOSIS — Z9049 Acquired absence of other specified parts of digestive tract: Secondary | ICD-10-CM | POA: Diagnosis not present

## 2021-03-13 DIAGNOSIS — Z85528 Personal history of other malignant neoplasm of kidney: Secondary | ICD-10-CM | POA: Diagnosis not present

## 2021-03-13 DIAGNOSIS — Z8673 Personal history of transient ischemic attack (TIA), and cerebral infarction without residual deficits: Secondary | ICD-10-CM | POA: Diagnosis not present

## 2021-03-13 DIAGNOSIS — G4730 Sleep apnea, unspecified: Secondary | ICD-10-CM | POA: Diagnosis not present

## 2021-03-13 DIAGNOSIS — Z833 Family history of diabetes mellitus: Secondary | ICD-10-CM | POA: Diagnosis not present

## 2021-03-13 DIAGNOSIS — J449 Chronic obstructive pulmonary disease, unspecified: Secondary | ICD-10-CM

## 2021-03-13 DIAGNOSIS — I2510 Atherosclerotic heart disease of native coronary artery without angina pectoris: Secondary | ICD-10-CM | POA: Diagnosis not present

## 2021-03-13 DIAGNOSIS — E1169 Type 2 diabetes mellitus with other specified complication: Secondary | ICD-10-CM | POA: Diagnosis not present

## 2021-03-13 DIAGNOSIS — E1122 Type 2 diabetes mellitus with diabetic chronic kidney disease: Secondary | ICD-10-CM | POA: Diagnosis not present

## 2021-03-13 DIAGNOSIS — N183 Chronic kidney disease, stage 3 unspecified: Secondary | ICD-10-CM | POA: Diagnosis not present

## 2021-03-13 DIAGNOSIS — I5032 Chronic diastolic (congestive) heart failure: Secondary | ICD-10-CM | POA: Diagnosis not present

## 2021-03-13 DIAGNOSIS — Z794 Long term (current) use of insulin: Secondary | ICD-10-CM | POA: Diagnosis not present

## 2021-03-13 NOTE — Progress Notes (Signed)
Cardiology Office Note  Date: 03/14/2021   ID: Lev, Ciolek 16-Sep-1955, MRN [id number]  PCP:  No primary care provider on file.  Cardiologist:  [name], MD Electrophysiologist:  None   Chief Complaint: Hospital follow up.   History of Present Illness: [name] is a 65 y.o. male with a history of DM2, CAD, CKD stage IIIb, HTN, COPD, chronic hypoxic respiratory failure on 6 L home O2 level diabetic foot.    He was last seen by Dr. Domenic Polite on 06/10/2020 for routine visit.  He was chronically short of breath on minimal NYHA class III baseline.  Intermittent anginal symptoms managed medically.  He had palliative home care in place.  He was continuing medical therapy for CAD including Plavix, Lipitor, Cardizem CD, and Imdur.  He was following Dr. Chase Caller, pulmonology, and currently on palliative care home services on supplemental oxygen with chronic hypoxic respiratory failure.  He was continuing Demadex with potassium supplementation for chronic diastolic heart failure.  He was COVID-positive on 12/14/2020.  He was admitted to [facility] on 12/24/2020 with  left diabetic foot with osteomyelitis of great toe.  Had partial first ray amputation on 12/27/2020.  PAD per ABI ordered by PCP.  To consider outpatient referral to vascular surgery.  COPD with chronic respiratory failure on 6 L oxygen at baseline.  History of PCI in 2015.  Having no anginal symptoms.  Continuing Plavix, statin, Imdur.   He is here for hospital follow-up today.  He denies any issues.  He has an orthopedic boot on his left foot after left first digit amputation.  He is receiving wound care.  He is on continuous O2 6 L.  Chronically short of breath.  He was previously on palliative care but states he is now out of palliative care.  Other than recent issue with osteomyelitis of left foot he states he had COVID twice.  He denies any anginal symptoms, orthostatic symptoms, CVA or TIA-like symptoms, orthostatic  symptoms.  States he sometimes feels transiently dizzy but no near syncopal or syncopal episodes.  Denies palpitations or arrhythmias, PND, orthopnea.  Denies any bleeding.  States he occasionally has what he describes as pleuritic-like chest pain with pain on inspiration.  He was seeing Dr. Chase Caller pulmonology.  He saw Dr. Chase Caller on February 26, 2021 for clinic visit.  Dr. Chase Caller indicated on latest CT scan there was no more evidence of interstitial lung disease or pulmonary fibrosis.  The dominant feature was emphysema.  Prescribed prednisone and started on Breztri inhaler.  He was referred to home palliative care.  He was to continue oxygen as before.    Past Medical History:  Diagnosis Date   Anxiety    Arthritis    Cholecystitis, acute 12/20/2013   Lap chole 6/5   Cocaine abuse ([name])    COPD (chronic obstructive pulmonary disease) ([name])    Coronary atherosclerosis of native coronary artery    a. 03/09/2013 Cath/PCI: LM nl, LAD: 50p, 38m(2.5x16 promus DES), LCX nl, OM1 min irregs, LPL/LPDA diff dzs, RCA nondom, mod diff dzs, EF 55%.   Depression    Essential hypertension    GERD (gastroesophageal reflux disease)    Headache    Hepatitis Late 1970s   History of nephrolithiasis    History of pneumonia    History of stroke    Right MCA distribution, residual left-sided weakness   Hyperlipidemia    Noncompliance    Renal cell carcinoma ([place]  Status post radical right nephrectomy August 2015   Sleep apnea    On CPAP, 4L O2 no cpap at home yet   Type 2 diabetes mellitus ([place]) 2011    Past Surgical History:  Procedure Laterality Date   AMPUTATION Left 12/27/2020   Procedure: PARTIAL FIRST RAY AMPUTATION OF FOOT;  Surgeon: Felipa Furnace, DPM;  Location: [facility];  Service: Podiatry;  Laterality: Left;   APPENDECTOMY  1970's   CHOLECYSTECTOMY N/A 12/22/2013   Procedure: LAPAROSCOPIC CHOLECYSTECTOMY ;  Surgeon: [name], MD;  Location: [place];  Service: General;   Laterality: N/A;   ENDARTERECTOMY Right 10/08/2014   Procedure: Right ENDARTERECTOMY CAROTID;  Surgeon: [name], MD;  Location: [place];  Service: Vascular;  Laterality: Right;   LAPAROSCOPIC LYSIS OF ADHESIONS  02/21/2014   Procedure: LAPAROSCOPIC LYSIS OF ADHESIONS EXTINSIVE;  Surgeon: [name], MD;  Location: [facility];  Service: Urology;;   LEFT HEART CATHETERIZATION WITH CORONARY ANGIOGRAM N/A 06/02/2012   Procedure: LEFT HEART CATHETERIZATION WITH CORONARY ANGIOGRAM;  Surgeon: [name], MD;  Location[name] C[facility] LAB;  Service: Cardiovascular;  Laterality: N/A;   LEFT HEART CATHETERIZATION WITH CORONARY ANGIOGRAM N/A 03/09/2013   Procedure: LEFT HEART CATHETERIZATION WITH CORONARY ANGIOGRAM;  Surgeon: [name], MD;  Location: [facility] CATH LAB;  Service: Cardiovascular;  Laterality: N/A;   LEFT HEART CATHETERIZATION WITH CORONARY ANGIOGRAM N/A 12/20/2013   Procedure: LEFT HEART CATHETERIZATION WITH CORONARY ANGIOGRAM;  Surgeon: [name], MD;  Location: [facility] CATH LAB;  Service: Cardiovascular;  Laterality: N/A;   LUNG BIOPSY Left 05/18/2014   Procedure: LUNG BIOPSY left upper lobe & left lower lobe;  Surgeon: [name], MD;  Location: [place];  Service: Thoracic;  Laterality: Left;   PATCH ANGIOPLASTY Right 10/08/2014   Procedure: PATCH ANGIOPLASTY Right Carotid;  Surgeon: [name], MD;  Location: [place];  Service: Vascular;  Laterality: Right;   ROBOT ASSISTED LAPAROSCOPIC NEPHRECTOMY Right 02/21/2014   Procedure: ROBOTIC ASSISTED LAPAROSCOPIC RIGHT NEPHRECTOMY ;  Surgeon: [name], MD;  Location: [facility];  Service: Urology;  Laterality: Right;   VIDEO ASSISTED THORACOSCOPY Left 05/18/2014   Procedure: LEFT VIDEO ASSISTED THORACOSCOPY;  Surgeon: [name], MD;  Location: [place];  Service: Thoracic;  Laterality: Left;   VIDEO BRONCHOSCOPY  05/18/2014   Procedure: VIDEO BRONCHOSCOPY;  Surgeon: [name], MD;  Location: [place];  Service:  Thoracic;;    Current Outpatient Medications  Medication Sig Dispense Refill   acetaminophen (TYLENOL) 325 MG tablet Take 650 mg by mouth as needed (pain).     ALPRAZolam (XANAX) 0.5 MG tablet Take 1 tablet (0.5 mg total) by mouth 3 (three) times daily as needed for anxiety.  0   ALPRAZolam (XANAX) 1 MG tablet Take 1 mg by mouth 3 (three) times daily as needed.     atorvastatin (LIPITOR) 20 MG tablet Take 20 mg by mouth at bedtime.  1   Budeson-Glycopyrrol-Formoterol (BREZTRI AEROSPHERE) 160-9-4.8 MCG/ACT AERO Inhale 2 puffs into the lungs in the morning and at bedtime. 10.7 g 5   budesonide (PULMICORT) 1 MG/2ML nebulizer solution Take 1 mg by nebulization as needed (shortness of breath).     clopidogrel (PLAVIX) 75 MG tablet TAKE ONE TABLET BY MOUTH EVERY DAY 90 tablet 2   diltiazem (CARDIZEM CD) 180 MG 24 hr capsule TAKE ONE CAPSULE BY MOUTH EVERY DAY 90 capsule 2   diphenhydrAMINE (BENADRYL) 25 mg capsule Take 25 mg by mouth every 6 (six)  hours as needed for itching (take with percocet).     docusate sodium (COLACE) 100 MG capsule Take 200 mg by mouth at bedtime.      esomeprazole (NEXIUM) 40 MG capsule Take 40 mg by mouth at bedtime. Pt only take as needed     FLUoxetine (PROZAC) 20 MG capsule Take by mouth.     FLUoxetine (PROZAC) 40 MG capsule Take 40 mg by mouth daily. Take with '10mg'$  capsule for a total of '50mg'$  daily.     formoterol (PERFOROMIST) 20 MCG/2ML nebulizer solution Take 20 mcg by nebulization as needed (shortness of breath).     gabapentin (NEURONTIN) 300 MG capsule Take 900 mg by mouth 3 (three) times daily.     glucose blood (TRUE METRIX BLOOD GLUCOSE TEST) test strip      insulin regular human CONCENTRATED (HUMULIN R) 500 UNIT/ML injection Inject 90 Units into the skin 3 (three) times daily with meals.     isosorbide mononitrate (IMDUR) 60 MG 24 hr tablet Take 1 tablet (60 mg total) by mouth daily.     linagliptin (TRADJENTA) 5 MG TABS tablet Take 5 mg by mouth daily.      nitroGLYCERIN (NITROSTAT) 0.4 MG SL tablet PLACE ONE tablet UNDER THE TONGUE every FIVE minutes as needed FOR CHEST PAIN. IF 3 TABLETS ARE NECESSARY CALL 911 25 tablet 3   oxybutynin (DITROPAN-XL) 10 MG 24 hr tablet Take 10 mg by mouth daily.  11   potassium chloride SA (K-DUR,KLOR-CON) 20 MEQ tablet Take 20 mEq by mouth 2 (two) times daily.      pregabalin (LYRICA) 25 MG capsule Take 25 mg by mouth 2 (two) times daily.     Probiotic Product (PROBIOTIC PO) Take 1 capsule by mouth daily.     sucralfate (CARAFATE) 1 g tablet TAKE ONE TABLET BY MOUTH TWICE DAILY ON an empty stomach (Patient not taking: Reported on 02/26/2021)     tamsulosin (FLOMAX) 0.4 MG CAPS capsule Take 0.4 mg by mouth daily.      torsemide (DEMADEX) 20 MG tablet TAKE THREE TABLETS BY MOUTH TWICE DAILY 540 tablet 0   zolpidem (AMBIEN) 5 MG tablet Take 5 mg by mouth at bedtime. For insomnia.     No current facility-administered medications for this visit.   Allergies:  Lisinopril and Oxycodone   Social History: The patient  reports that he has been smoking cigarettes. He started smoking about 48 years ago. He has a 41.00 pack-year smoking history. He has never used smokeless tobacco. He reports that he does not drink alcohol and does not use drugs.   Family History: The patient's family history includes CAD in his brother and father; Cancer in his maternal grandfather, maternal grandmother, and mother; Diabetes in an other family member; Hypertension in an other family member; Lung cancer in his father; Stroke in an other family member.   ROS:  Please see the history of present illness. Otherwise, complete review of systems is positive for none.  All other systems are reviewed and negative.   Physical Exam: VS:  BP 114/78   Pulse 71   Ht 6' (1.829 m)   Wt 225 lb 12.8 oz (102.4 kg)   SpO2 96% Comment: 6 L/min 02 24/7  BMI 30.62 kg/m , BMI Body mass index is 30.62 kg/m.  Wt Readings from Last 3 Encounters:  03/14/21 225  lb 12.8 oz (102.4 kg)  02/26/21 219 lb 3.2 oz (99.4 kg)  12/28/20 224 lb 12.8 oz (102 kg)  General: Patient appears comfortable at rest. Neck: Supple, no elevated JVP or carotid bruits, no thyromegaly. Lungs: Inspiratory and expiratory wheezing, nonlabored breathing at rest. Cardiac: Regular rate and rhythm, no S3 or significant systolic murmur, no pericardial rub. Extremities: No pitting edema, distal pulses 2+.  Orthopedic shoe on left foot secondary to surgery amputation of left great toe Skin: Warm and dry. Musculoskeletal: No kyphosis. Neuropsychiatric: Alert and oriented x3, affect grossly appropriate.  ECG:  EKG 12/25/2018 22 drop read to ED, sinus tachycardia rate of 100, atrial premature complexes and couplets, prolonged PR interval, consider right ventricular hypertrophy nonspecific T wave abnormalities inferior leads  Recent Labwork: 04/12/2020: B Natriuretic Peptide 84.2 12/28/2020: ALT 5; AST 11; BUN 18; Creatinine, Ser 1.12; Hemoglobin 11.0; Magnesium 1.8; Platelets 192; Potassium 4.3; Sodium 134     Component Value Date/Time   CHOL 128 12/19/2013 0220   TRIG 152 (H) 12/19/2013 0220   HDL 31 (L) 12/19/2013 0220   CHOLHDL 4.1 12/19/2013 0220   VLDL 30 12/19/2013 0220   LDLCALC 67 12/19/2013 0220    Other Studies Reviewed Today:  ABI 12/26/2020 Summary:  Right: Resting right ankle-brachial index indicates mild right lower  extremity arterial disease. The right toe-brachial index is abnormal.   Left: Resting left ankle-brachial index indicates moderate left lower  extremity arterial disease.   Unable to obtain TBI due to open wounds.      Echocardiogram 12/06/2020  1. Left ventricular ejection fraction, by estimation, is 55 to 60%. The left ventricle has normal function. The left ventricle has no regional wall motion abnormalities. There is mild left ventricular hypertrophy. Left ventricular diastolic parameters are consistent with Grade I diastolic dysfunction  (impaired relaxation). 2. Right ventricular systolic function is normal. The right ventricular size is normal. Tricuspid regurgitation signal is inadequate for assessing PA pressure. 3. Left atrial size was mildly dilated. 4. The mitral valve is grossly normal. Trivial mitral valve regurgitation. 5. The aortic valve is tricuspid. Aortic valve regurgitation is not visualized. 6. Mildly dilated pulmonary artery. 7. The inferior vena cava is dilated in size with >50% respiratory variability, suggesting right atrial pressure of 8 mmHg. Comparison(s): Prior images unable to be directly viewed, comparison made by report only. Changes from prior study are noted. 07/05/2015: LVEF 60-65%.    Cardiac catheterization 12/20/2013: Hemodynamic Findings: Central aortic pressure: 120/62 Left ventricular pressure: 127/7/15   Angiographic Findings:   Left main: Short segment. No obstructive disease.     Left Anterior Descending Artery: Moderate caliber vessel that courses to the apex. There is 40% proximal stenosis. The mid stented segment is patent without restenosis. The distal vessel has diffuse non-obstructive plaque. The diagonal branches are small in caliber with no obstructive disease.     Circumflex Artery: Large dominant system with large bifurcating first obtuse marginal branch and three small caliber posterolateral branches. The OM branch has diffuse 50% proximal stenosis, no focally obstructive lesions. The three small caliber posterolateral branches all have diffuse moderate to severe stenosis that is unchanged from last cath.     Right Coronary Artery: Small non-dominant vessel with 40% mid vessel stenosis.    Left Ventricular Angiogram: LVEF=65%   Impression: 1. Double vessel CAD with patent stent mid LAD 2. Moderate non-obstructive disease in the Circumflex and RCA 3. Normal LV systolic function   Echocardiogram 07/05/2015: Study Conclusions  - Left ventricle: The cavity size was  normal. Wall thickness was   normal. Systolic function was normal. The estimated ejection   fraction was  in the range of 60% to 65%. Doppler parameters are   consistent with abnormal left ventricular relaxation (grade 1   diastolic dysfunction).   High-resolution chest CT 08/30/2019: IMPRESSION: 1. Negative for interstitial lung disease. 2. Interval clearing of previously seen subpleural ground-glass in the anterior segment right upper lobe. 3. Aortic atherosclerosis (ICD10-I70.0). Coronary artery calcification. 4.  Emphysema (ICD10-J43.9).    Assessment and Plan:  1. CAD in native artery   2. End stage COPD ([place])   3. Chronic diastolic heart failure ([place])   4. Essential hypertension, benign    1. CAD in native artery Denies any anginal or exertional symptoms.  He is very inactive due to end-stage lung disease and mobility issues secondary to recent left foot surgery.  Continue Imdur 60 mg daily, nitroglycerin 0.4 as needed, atorvastatin 20 mg daily, Plavix 75 mg daily.  2. End stage COPD ([place]) Significant history of smoking with end-stage COPD.  Follows with Dr. Chase Caller pulmonology.  On continuous O2 6 L nasal cannula.  Recently started on Breztri inhaler.    3. Chronic diastolic heart failure ([place]) Last echocardiogram 12/06/2020 EF 55-60.  No WMA's.  Mild LVH, G1 DD, LA mildly dilated, trivial MR, mildly dilated pulmonary artery.  Current weight 225.  Weight on 02/27/2019 22-19.  Continue torsemide 60 mg p.o. twice daily.  Continue potassium supplementation 20 mEq 2 times daily.  4.  Essential hypertension. Blood pressure well controlled today at 114/78 continue diltiazem 180 mg daily.  Continue torsemide 60 mg p.o. twice daily.  Medication Adjustments/Labs and Tests Ordered: Current medicines are reviewed at length with the patient today.  Concerns regarding medicines are outlined above.   Disposition: Follow-up with Dr. Domenic Polite or APP 6 months  Signed, Levell July,  NP 03/14/2021 5:17 PM    [facility] [place], [place], [place] [address] Phone: [phone]; Fax: [phone]

## 2021-03-14 ENCOUNTER — Encounter: Payer: Self-pay | Admitting: Family Medicine

## 2021-03-14 ENCOUNTER — Ambulatory Visit (INDEPENDENT_AMBULATORY_CARE_PROVIDER_SITE_OTHER): Payer: Medicare Other | Admitting: Family Medicine

## 2021-03-14 VITALS — BP 114/78 | HR 71 | Ht 72.0 in | Wt 225.8 lb

## 2021-03-14 DIAGNOSIS — I10 Essential (primary) hypertension: Secondary | ICD-10-CM

## 2021-03-14 DIAGNOSIS — J449 Chronic obstructive pulmonary disease, unspecified: Secondary | ICD-10-CM

## 2021-03-14 DIAGNOSIS — I5032 Chronic diastolic (congestive) heart failure: Secondary | ICD-10-CM

## 2021-03-14 DIAGNOSIS — I2510 Atherosclerotic heart disease of native coronary artery without angina pectoris: Secondary | ICD-10-CM | POA: Diagnosis not present

## 2021-03-14 NOTE — Patient Instructions (Signed)
Medication Instructions:  Continue all current medications.   Labwork: none  Testing/Procedures: none  Follow-Up: 6 months   Any Other Special Instructions Will Be Listed Below (If Applicable).   If you need a refill on your cardiac medications before your next appointment, please call your pharmacy.  

## 2021-03-15 ENCOUNTER — Other Ambulatory Visit: Payer: Self-pay

## 2021-03-15 ENCOUNTER — Ambulatory Visit (HOSPITAL_COMMUNITY)
Admission: RE | Admit: 2021-03-15 | Discharge: 2021-03-15 | Disposition: A | Discharge status: Home / Self Care | Payer: Medicare Other | Source: Ambulatory Visit | Attending: Internal Medicine | Admitting: Internal Medicine

## 2021-03-15 DIAGNOSIS — M869 Osteomyelitis, unspecified: Secondary | ICD-10-CM | POA: Insufficient documentation

## 2021-03-15 DIAGNOSIS — L97509 Non-pressure chronic ulcer of other part of unspecified foot with unspecified severity: Secondary | ICD-10-CM | POA: Insufficient documentation

## 2021-03-15 DIAGNOSIS — S92352A Displaced fracture of fifth metatarsal bone, left foot, initial encounter for closed fracture: Secondary | ICD-10-CM | POA: Diagnosis not present

## 2021-03-15 DIAGNOSIS — E11621 Type 2 diabetes mellitus with foot ulcer: Secondary | ICD-10-CM | POA: Diagnosis not present

## 2021-03-15 DIAGNOSIS — E1169 Type 2 diabetes mellitus with other specified complication: Secondary | ICD-10-CM | POA: Diagnosis not present

## 2021-03-15 DIAGNOSIS — Z9889 Other specified postprocedural states: Secondary | ICD-10-CM | POA: Diagnosis not present

## 2021-03-15 DIAGNOSIS — M7989 Other specified soft tissue disorders: Secondary | ICD-10-CM | POA: Diagnosis not present

## 2021-03-17 NOTE — Telephone Encounter (Signed)
Returned the call to patient for more information concerning paperwork and liberty. No answer, left  a vmessage for a call back .

## 2021-03-18 NOTE — Progress Notes (Signed)
[name] ([id number]) , Visit Report for 03/13/2021 Chief Complaint Document Details Patient Name: Date of Service: [name] 03/13/2021 3:30 PM Medical Record Number: [id number] Patient Account Number: [id number] Date of Birth/Sex: Treating RN: 09-09-1955 (65 y.o. Erie Noe Primary Care Provider: Sammuel Hines Other Clinician: Referring Provider: Treating Provider/Extender: Seward Grater in Treatment: 1 Information Obtained from: Patient Chief Complaint non healing to the left great toe surgical wound site s/p amputation Electronic Signature(s) Signed: 03/13/2021 4:16:06 PM By: Kalman Shan DO Entered By: Kalman Shan on 03/13/2021 16:12:35 -------------------------------------------------------------------------------- HPI Details Patient Name: Date of Service: [name]. 03/13/2021 3:30 PM Medical Record Number: [id number] Patient Account Number: [id number] Date of Birth/Sex: Treating RN: [birth year] (65 y.o. Erie Noe Primary Care Provider: Sammuel Hines Other Clinician: Referring Provider: Treating Provider/Extender: Seward Grater in Treatment: 1 History of Present Illness HPI Description: Admission 8/18 [name] is a 64 year old male with a past medical history of uncontrolled insulin-dependent type 2 diabetes, COPD On O2 6 L via nasal cannula That presents to the clinic for a 69-monthhistory of nonhealing wound to the left great toe amputation site. He has been following with podiatry for a wound to his left great toe that developed osteomyelitis and subsequently required amputation. He had the surgery done on 6/10. He had a partial first ray amputation by Dr. PPosey Pronto Since his surgery he has been placing iodine on the wound bed. He states that there has been no improvement to the wound healing in the past 2 months. He states he has occasional intermittent pain to the area. He  denies systemic signs of infection. 8/25; patient presents for 1 week follow-up. He states he has been using silver alginate daily without issues. He has no complaints today. He denies signs of infection. He is scheduled to have his MRI on 8/27. Electronic Signature(s) Signed: 03/13/2021 4:16:06 PM By: HKalman ShanDO Entered By: HKalman Shanon 03/13/2021 16:13:02 -------------------------------------------------------------------------------- Physical Exam Details Patient Name: Date of Service: [name], [name]8/25/2022 3:30 PM Medical Record Number: [id number] Account Number: [id number] of Birth/Sex: Treating RN: [birth year](65y.o. MErie NoePrimary Care Provider: WSammuel HinesOther Clinician: Referring Provider: Treating Provider/Extender: HSeward Graterin Treatment: 1 Constitutional respirations regular, non-labored and within target range for patient.. Cardiovascular 2+ dorsalis pedis/posterior tibialis pulses. Psychiatric pleasant and cooperative. Notes Left foot: The left great toe amputation site there is an open wound with nonviable tissue. Depth is close to bone. No maceration. No signs of infection. Electronic Signature(s) Signed: 03/13/2021 4:16:06 PM By: HKalman ShanDO Entered By: HKalman Shanon 03/13/2021 16:14:05 -------------------------------------------------------------------------------- Physician Orders Details Patient Name: Date of Service: [name] 03/13/2021 3:30 PM Medical Record Number: [id number] Account Number: [id number] of Birth/Sex: Treating RN: 102/25/1957(65y.o. MJanyth ContesPrimary Care Provider: WSammuel HinesOther Clinician: Referring Provider: Treating Provider/Extender: HSeward Graterin Treatment: 1 Verbal / Phone Orders: No Diagnosis Coding ICD-10 Coding Code Description E11.621 Type 2 diabetes mellitus with foot  ulcer S91.302A Unspecified open wound, left foot, initial encounter J44.9 Chronic obstructive pulmonary disease, unspecified Follow-up Appointments ppointment in 1 week. - Dr. HHeber [place] A Bathing/ Shower/ Hygiene May shower with protection but do not get wound dressing(s) wet. - May use a cast protector to protect dressing in shower Edema Control - Lymphedema / SCD / Other Elevate legs to the level of the heart or above for  30 minutes daily and/or when sitting, a frequency of: - throughout the day Avoid standing for long periods of time. Off-Loading Open toe surgical shoe to: - left foot Wound Treatment Wound #5 - Amputation Site - Toe Wound Laterality: Left Cleanser: Soap and Water 1 x Per Day/15 Days Discharge Instructions: May shower and wash wound with dial antibacterial soap and water prior to dressing change. Cleanser: Wound Cleanser (Generic) 1 x Per Day/15 Days Discharge Instructions: Cleanse the wound with wound cleanser prior to applying a clean dressing using gauze sponges, not tissue or cotton balls. Prim Dressing: KerraCel Ag Gelling Fiber Dressing, 4x5 in (silver alginate) (Generic) 1 x Per Day/15 Days ary Discharge Instructions: Apply silver alginate to wound bed as instructed Secondary Dressing: Woven Gauze Sponge, Non-Sterile 4x4 in (Generic) 1 x Per Day/15 Days Discharge Instructions: Apply over primary dressing as directed. Secondary Dressing: ABD Pad, 5x9 1 x Per Day/15 Days Discharge Instructions: Apply over primary dressing as directed. Secured With: [employer], 4.5x3.1 (in/yd) (Generic) 1 x Per Day/15 Days Discharge Instructions: Secure with Kerlix as directed. Secured With: 53M Medipore H Soft Cloth Surgical T 4 x 2 (in/yd) (Generic) 1 x Per Day/15 Days ape Discharge Instructions: Secure dressing with tape as directed. Electronic Signature(s) Signed: 03/13/2021 4:16:06 PM By: Kalman Shan DO Entered By: Kalman Shan on 03/13/2021  16:14:18 -------------------------------------------------------------------------------- Problem List Details Patient Name: Date of Service: [name]. 03/13/2021 3:30 PM Medical Record Number: [id number] Patient Account Number: [id number] Date of Birth/Sex: Treating RN: June 26, 1956 (65 y.o. Janyth Contes Primary Care Provider: Sammuel Hines Other Clinician: Referring Provider: Treating Provider/Extender: Seward Grater in Treatment: 1 Active Problems ICD-10 Encounter Code Description Active Date MDM Diagnosis S91.302D Unspecified open wound, left foot, subsequent encounter 03/13/2021 No Yes E11.621 Type 2 diabetes mellitus with foot ulcer 03/06/2021 No Yes J44.9 Chronic obstructive pulmonary disease, unspecified 03/06/2021 No Yes M86.9 Osteomyelitis, unspecified 03/13/2021 No Yes Inactive Problems ICD-10 Code Description Active Date Inactive Date S91.302A Unspecified open wound, left foot, initial encounter 03/06/2021 03/06/2021 Resolved Problems Electronic Signature(s) Signed: 03/13/2021 4:16:06 PM By: Kalman Shan DO Entered By: Kalman Shan on 03/13/2021 16:12:07 -------------------------------------------------------------------------------- Progress Note Details Patient Name: Date of Service: [name] 03/13/2021 3:30 PM Medical Record Number: [id number] Patient Account Number: [id number] Date of Birth/Sex: Treating RN: 11/04/1955 (65 y.o. [name], Lauren Primary Care Provider: Sammuel Hines Other Clinician: Referring Provider: Treating Provider/Extender: Seward Grater in Treatment: 1 Subjective Chief Complaint Information obtained from Patient non healing to the left great toe surgical wound site s/p amputation History of Present Illness (HPI) Admission 8/18 [name] is a 66 year old male with a past medical history of uncontrolled insulin-dependent type 2 diabetes, COPD On O2 6 L via  nasal cannula That presents to the clinic for a 19-monthhistory of nonhealing wound to the left great toe amputation site. He has been following with podiatry for a wound to his left great toe that developed osteomyelitis and subsequently required amputation. He had the surgery done on 6/10. He had a partial first ray amputation by Dr. PPosey Pronto Since his surgery he has been placing iodine on the wound bed. He states that there has been no improvement to the wound healing in the past 2 months. He states he has occasional intermittent pain to the area. He denies systemic signs of infection. 8/25; patient presents for 1 week follow-up. He states he has been using silver alginate daily without issues. He has no complaints  today. He denies signs of infection. He is scheduled to have his MRI on 8/27. Patient History Information obtained from Patient. Family History Cancer - Maternal Grandparents,Paternal Grandparents,Father,Mother, Diabetes - Mother,Father, Heart Disease - Maternal Grandparents,Mother,Father, Kidney Disease - Maternal Grandparents, Lung Disease - Father, Stroke - Paternal Grandparents,Maternal Grandparents,Mother, No family history of Hereditary Spherocytosis, Hypertension, Seizures, Thyroid Problems, Tuberculosis. Social History Current every day smoker - 1/2 pack a day, Alcohol Use - Never, Drug Use - No History, Caffeine Use - Daily - coffee, tea. Medical History Respiratory Patient has history of Chronic Obstructive Pulmonary Disease (COPD), Sleep Apnea - does not use CPAP Cardiovascular Patient has history of Coronary Artery Disease - mx vessel, Hypertension Gastrointestinal Denies history of Cirrhosis , Colitis, Crohnoos, Hepatitis A, Hepatitis B, Hepatitis C Endocrine Patient has history of Type II Diabetes - last A1c - 13.3 Hospitalization/Surgery History - appendectomy. - cholecystectomy. - laproscopic nephrectomy. - lapro lysis of lesions. - thorascopy. - lung biopsy.  - bronchoscopy. - (L) heart cath with angio X3. - endarcdectomy. - patch angioplasty. - left great toe amp. December 27, 2020. Medical A Surgical History Notes nd Constitutional Symptoms (General Health) noncompliance , cocaine abuse ,hepatitis ,headaches Respiratory h/o pneumonia , end stage chronic lung disease , COPD/ILD Cardiovascular h/o CVA , hyperlipidemia , coronary atherosclerosis , acute on chronic diastolic heart failure LVEF 60%-65% Gastrointestinal cholecystitis , GERD Genitourinary h/o nephrolithiasis , h/o renal cell carcinoma , CKD Stage 3 Musculoskeletal arthritis Psychiatric anxiety , depression Objective Constitutional respirations regular, non-labored and within target range for patient.. Vitals Time Taken: 3:37 PM, Temperature: 97.9 F, Pulse: 116 bpm, Respiratory Rate: 16 breaths/min, Blood Pressure: 98/61 mmHg. Cardiovascular 2+ dorsalis pedis/posterior tibialis pulses. Psychiatric pleasant and cooperative. General Notes: Left foot: The left great toe amputation site there is an open wound with nonviable tissue. Depth is close to bone. No maceration. No signs of infection. Integumentary (Hair, Skin) Wound #5 status is Open. Original cause of wound was Surgical Injury. The date acquired was: 12/27/2020. The wound has been in treatment 1 weeks. The wound is located on the Left Amputation Site - T The wound measures 1cm length x 0.6cm width x 1.2cm depth; 0.471cm^2 area and 0.565cm^3 volume. oe. There is tendon and Fat Layer (Subcutaneous Tissue) exposed. There is no tunneling or undermining noted. There is a medium amount of serosanguineous drainage noted. The wound margin is well defined and not attached to the wound base. There is large (67-100%) red, pink, hyper - granulation within the wound bed. There is a small (1-33%) amount of necrotic tissue within the wound bed including Adherent Slough. Assessment Active Problems ICD-10 Unspecified open wound, left  foot, subsequent encounter Type 2 diabetes mellitus with foot ulcer Chronic obstructive pulmonary disease, unspecified Osteomyelitis, unspecified Patient's wound actually shows some slight improvement in appearance since last clinic visit. Depth is still close to bone. MRI scheduled for 8/27. I recommended continuing silver alginate daily. Follow-up in 1 week. T patient to take dressing off and keeping area covered prior to MRI due to the silver. old Plan Follow-up Appointments: Return Appointment in 1 week. - Dr. Heber [place] Bathing/ Shower/ Hygiene: May shower with protection but do not get wound dressing(s) wet. - May use a cast protector to protect dressing in shower Edema Control - Lymphedema / SCD / Other: Elevate legs to the level of the heart or above for 30 minutes daily and/or when sitting, a frequency of: - throughout the day Avoid standing for long periods of time. Off-Loading: Open  toe surgical shoe to: - left foot WOUND #5: - Amputation Site - T oe Wound Laterality: Left Cleanser: Soap and Water 1 x Per [id number] Days Discharge Instructions: May shower and wash wound with dial antibacterial soap and water prior to dressing change. Cleanser: Wound Cleanser (Generic) 1 x Per Day/15 Days Discharge Instructions: Cleanse the wound with wound cleanser prior to applying a clean dressing using gauze sponges, not tissue or cotton balls. Prim Dressing: KerraCel Ag Gelling Fiber Dressing, 4x5 in (silver alginate) (Generic) 1 x Per Day/15 Days ary Discharge Instructions: Apply silver alginate to wound bed as instructed Secondary Dressing: Woven Gauze Sponge, Non-Sterile 4x4 in (Generic) 1 x Per Day/15 Days Discharge Instructions: Apply over primary dressing as directed. Secondary Dressing: ABD Pad, 5x9 1 x Per Day/15 Days Discharge Instructions: Apply over primary dressing as directed. Secured With: [employer], 4.5x3.1 (in/yd) (Generic) 1 x Per Day/15 Days Discharge Instructions:  Secure with Kerlix as directed. Secured With: 31M Medipore H Soft Cloth Surgical T 4 x 2 (in/yd) (Generic) 1 x Per Day/15 Days ape Discharge Instructions: Secure dressing with tape as directed. 1. Continue silver alginate daily 2. Follow-up in 1 week Electronic Signature(s) Signed: 03/13/2021 4:16:06 PM By: Kalman Shan DO Entered By: Kalman Shan on 03/13/2021 16:15:32 -------------------------------------------------------------------------------- HxROS Details Patient Name: Date of Service: [name]. 03/13/2021 3:30 PM Medical Record Number: [id number] Patient Account Number: [id number] Date of Birth/Sex: Treating RN: 1955/08/05 (65 y.o. Erie Noe Primary Care Provider: Sammuel Hines Other Clinician: Referring Provider: Treating Provider/Extender: Seward Grater in Treatment: 1 Information Obtained From Patient Constitutional Symptoms (General Health) Medical History: Past Medical History Notes: noncompliance , cocaine abuse ,hepatitis ,headaches Respiratory Medical History: Positive for: Chronic Obstructive Pulmonary Disease (COPD); Sleep Apnea - does not use CPAP Past Medical History Notes: h/o pneumonia , end stage chronic lung disease , COPD/ILD Cardiovascular Medical History: Positive for: Coronary Artery Disease - mx vessel; Hypertension Past Medical History Notes: h/o CVA , hyperlipidemia , coronary atherosclerosis , acute on chronic diastolic heart failure LVEF 60%-65% Gastrointestinal Medical History: Negative for: Cirrhosis ; Colitis; Crohns; Hepatitis A; Hepatitis B; Hepatitis C Past Medical History Notes: cholecystitis , GERD Endocrine Medical History: Positive for: Type II Diabetes - last A1c - 13.3 Genitourinary Medical History: Past Medical History Notes: h/o nephrolithiasis , h/o renal cell carcinoma , CKD Stage 3 Musculoskeletal Medical History: Past Medical History  Notes: arthritis Psychiatric Medical History: Past Medical History Notes: anxiety , depression Immunizations Pneumococcal Vaccine: Received Pneumococcal Vaccination: Yes Received Pneumococcal Vaccination On or After 60th Birthday: Yes Implantable Devices None Hospitalization / Surgery History Type of Hospitalization/Surgery appendectomy cholecystectomy laproscopic nephrectomy lapro lysis of lesions thorascopy lung biopsy bronchoscopy (L) heart cath with angio X3 endarcdectomy patch angioplasty left great toe amp. December 27, 2020 Family and Social History Cancer: Yes - Maternal Grandparents,Paternal Grandparents,Father,Mother; Diabetes: Yes - Mother,Father; Heart Disease: Yes - Maternal Grandparents,Mother,Father; Hereditary Spherocytosis: No; Hypertension: No; Kidney Disease: Yes - Maternal Grandparents; Lung Disease: Yes - Father; Seizures: No; Stroke: Yes - Paternal Grandparents,Maternal Grandparents,Mother; Thyroid Problems: No; Tuberculosis: No; Current every day smoker - 1/2 pack a day; Alcohol Use: Never; Drug Use: No History; Caffeine Use: Daily - coffee, tea; Financial Concerns: No; Food, Clothing or Shelter Needs: No; Support System Lacking: No; Transportation Concerns: No Electronic Signature(s) Signed: 03/13/2021 4:16:06 PM By: Kalman Shan DO Signed: 03/18/2021 5:24:19 PM By: Rhae Hammock RN Entered By: Kalman Shan on 03/13/2021 16:13:11 -------------------------------------------------------------------------------- SuperBill Details Patient Name: Date of Service:  TAREK, MCLANE 03/13/2021 Medical Record Number: [id number] Patient Account Number: [id number] Date of Birth/Sex: Treating RN: January 09, 1956 (65 y.o. Erie Noe Primary Care Provider: Sammuel Hines Other Clinician: Referring Provider: Treating Provider/Extender: Seward Grater in Treatment: 1 Diagnosis Coding ICD-10 Codes Code Description [phone]  Unspecified open wound, left foot, subsequent encounter E11.621 Type 2 diabetes mellitus with foot ulcer J44.9 Chronic obstructive pulmonary disease, unspecified M86.9 Osteomyelitis, unspecified Facility Procedures CPT4 Code: [id number] Description: 99213 - WOUND CARE VISIT-LEV 3 EST PT Modifier: Quantity: 1 Physician Procedures : CPT4 Code Description Modifier [id number] - WC PHYS LEVEL 3 - EST PT ICD-10 Diagnosis Description S91.302D Unspecified open wound, left foot, subsequent encounter E11.621 Type 2 diabetes mellitus with foot ulcer J44.9 Chronic obstructive pulmonary  disease, unspecified M86.9 Osteomyelitis, unspecified Quantity: 1 Electronic Signature(s) Signed: 03/13/2021 4:57:56 PM By: Levan Hurst RN, BSN Signed: 03/14/2021 8:47:49 AM By: Kalman Shan DO Previous Signature: 03/13/2021 4:16:06 PM Version By: Kalman Shan DO Entered By: Levan Hurst on 03/13/2021 16:36:45

## 2021-03-18 NOTE — Progress Notes (Signed)
[name] ([id number]) , Visit Report for 03/13/2021 Arrival Information Details Patient Name: Date of Service: SABRE, MORIOKA 03/13/2021 3:30 PM Medical Record Number: [id number] Patient Account Number: [id number] Date of Birth/Sex: Treating RN: [birth year] (65 y.o. Janyth Contes Primary Care Leilanny Fluitt: Sammuel Hines Other Clinician: Referring Jamel Dunton: Treating Kaidyn Javid/Extender: Seward Grater in Treatment: 1 Visit Information History Since Last Visit Added or deleted any medications: No Patient Arrived: Ambulatory Any new allergies or adverse reactions: No Arrival Time: 15:37 Had a fall or experienced change in No Accompanied By: alone activities of daily living that may affect Transfer Assistance: None risk of falls: Patient Identification Verified: Yes Signs or symptoms of abuse/neglect since No Secondary Verification Process Completed: Yes last visito Patient Requires Transmission-Based Precautions: No Hospitalized since last visit: No Patient Has Alerts: Yes Implantable device outside of the clinic No Patient Alerts: ABI's: R: 0.82 L:0.76 excluding cellular tissue based products placed in the center since last visit: Has Dressing in Place as Prescribed: Yes Has Footwear/Offloading in Place as Yes Prescribed: Left: Surgical Shoe with Pressure Relief Insole Pain Present Now: No Electronic Signature(s) Signed: 03/13/2021 4:57:56 PM By: Levan Hurst RN, BSN Entered By: Levan Hurst on 03/13/2021 15:49:34 -------------------------------------------------------------------------------- Clinic Level of Care Assessment Details Patient Name: Date of Service: TOM, FRIEDERS 03/13/2021 3:30 PM Medical Record Number: [id number] Patient Account Number: [id number] Date of Birth/Sex: Treating RN: 1956/06/18 (65 y.o. Janyth Contes Primary Care Grisela Mesch: Sammuel Hines Other Clinician: Referring Aubriee Szeto: Treating Brindley Madarang/Extender:  Seward Grater in Treatment: 1 Clinic Level of Care Assessment Items TOOL 4 Quantity Score X- 1 0 Use when only an EandM is performed on FOLLOW-UP visit ASSESSMENTS - Nursing Assessment / Reassessment X- 1 10 Reassessment of Co-morbidities (includes updates in patient status) X- 1 5 Reassessment of Adherence to Treatment Plan ASSESSMENTS - Wound and Skin A ssessment / Reassessment X - Simple Wound Assessment / Reassessment - one wound 1 5 '[]'$  - 0 Complex Wound Assessment / Reassessment - multiple wounds '[]'$  - 0 Dermatologic / Skin Assessment (not related to wound area) ASSESSMENTS - Focused Assessment '[]'$  - 0 Circumferential Edema Measurements - multi extremities '[]'$  - 0 Nutritional Assessment / Counseling / Intervention X- 1 5 Lower Extremity Assessment (monofilament, tuning fork, pulses) '[]'$  - 0 Peripheral Arterial Disease Assessment (using hand held doppler) ASSESSMENTS - Ostomy and/or Continence Assessment and Care '[]'$  - 0 Incontinence Assessment and Management '[]'$  - 0 Ostomy Care Assessment and Management (repouching, etc.) PROCESS - Coordination of Care X - Simple Patient / Family Education for ongoing care 1 15 '[]'$  - 0 Complex (extensive) Patient / Family Education for ongoing care X- 1 10 Staff obtains Programmer, systems, Records, T Results / Process Orders est '[]'$  - 0 Staff telephones HHA, Nursing Homes / Clarify orders / etc '[]'$  - 0 Routine Transfer to another Facility (non-emergent condition) '[]'$  - 0 Routine Hospital Admission (non-emergent condition) '[]'$  - 0 New Admissions / Biomedical engineer / Ordering NPWT Apligraf, etc. , '[]'$  - 0 Emergency Hospital Admission (emergent condition) X- 1 10 Simple Discharge Coordination '[]'$  - 0 Complex (extensive) Discharge Coordination PROCESS - Special Needs '[]'$  - 0 Pediatric / Minor Patient Management '[]'$  - 0 Isolation Patient Management '[]'$  - 0 Hearing / Language / Visual special needs '[]'$  - 0 Assessment of  Community assistance (transportation, D/C planning, etc.) '[]'$  - 0 Additional assistance / Altered mentation '[]'$  - 0 Support Surface(s) Assessment (bed, cushion, seat, etc.) INTERVENTIONS - Wound Cleansing / Measurement  X - Simple Wound Cleansing - one wound 1 5 '[]'$  - 0 Complex Wound Cleansing - multiple wounds X- 1 5 Wound Imaging (photographs - any number of wounds) '[]'$  - 0 Wound Tracing (instead of photographs) X- 1 5 Simple Wound Measurement - one wound '[]'$  - 0 Complex Wound Measurement - multiple wounds INTERVENTIONS - Wound Dressings '[]'$  - 0 Small Wound Dressing one or multiple wounds X- 1 15 Medium Wound Dressing one or multiple wounds '[]'$  - 0 Large Wound Dressing one or multiple wounds '[]'$  - 0 Application of Medications - topical '[]'$  - 0 Application of Medications - injection INTERVENTIONS - Miscellaneous '[]'$  - 0 External ear exam '[]'$  - 0 Specimen Collection (cultures, biopsies, blood, body fluids, etc.) '[]'$  - 0 Specimen(s) / Culture(s) sent or taken to Lab for analysis '[]'$  - 0 Patient Transfer (multiple staff / Civil Service fast streamer / Similar devices) '[]'$  - 0 Simple Staple / Suture removal (25 or less) '[]'$  - 0 Complex Staple / Suture removal (26 or more) '[]'$  - 0 Hypo / Hyperglycemic Management (close monitor of Blood Glucose) '[]'$  - 0 Ankle / Brachial Index (ABI) - do not check if billed separately X- 1 5 Vital Signs Has the patient been seen at the hospital within the last three years: Yes Total Score: 95 Level Of Care: New/Established - Level 3 Electronic Signature(s) Signed: 03/13/2021 4:57:56 PM By: Levan Hurst RN, BSN Entered By: Levan Hurst on 03/13/2021 16:36:37 -------------------------------------------------------------------------------- Encounter Discharge Information Details Patient Name: Date of Service: [name]. 03/13/2021 3:30 PM Medical Record Number: [id number] Patient Account Number: [id number] Date of Birth/Sex: Treating RN: 16-Aug-1955 (65 y.o. Janyth Contes Primary Care Tenzin Pavon: Sammuel Hines Other Clinician: Referring Nareg Breighner: Treating Jamilla Galli/Extender: Seward Grater in Treatment: 1 Encounter Discharge Information Items Discharge Condition: Stable Ambulatory Status: Ambulatory Discharge Destination: Home Transportation: Private Auto Accompanied By: alone Schedule Follow-up Appointment: Yes Clinical Summary of Care: Patient Declined Electronic Signature(s) Signed: 03/13/2021 4:57:56 PM By: Levan Hurst RN, BSN Entered By: Levan Hurst on 03/13/2021 16:37:15 -------------------------------------------------------------------------------- Lower Extremity Assessment Details Patient Name: Date of Service: [name]. 03/13/2021 3:30 PM Medical Record Number: [id number] Patient Account Number: [id number] Date of Birth/Sex: Treating RN: December 20, 1955 (65 y.o. Janyth Contes Primary Care Francesa Eugenio: Sammuel Hines Other Clinician: Referring Braedin Millhouse: Treating Charidy Cappelletti/Extender: Seward Grater in Treatment: 1 Edema Assessment Assessed: [Left: No] [name]: No] Edema: [Left: Ye] [Right: s] Calf Left: Right: Point of Measurement: 37 cm From Medial Instep 37 cm Ankle Left: Right: Point of Measurement: 8 cm From Medial Instep 23 cm Vascular Assessment Pulses: Dorsalis Pedis Palpable: [Left:Yes] Electronic Signature(s) Signed: 03/13/2021 4:57:56 PM By: Levan Hurst RN, BSN Entered By: Levan Hurst on 03/13/2021 15:50:34 -------------------------------------------------------------------------------- Multi Wound Chart Details Patient Name: Date of Service: [name]. 03/13/2021 3:30 PM Medical Record Number: [id number] Patient Account Number: [id number] Date of Birth/Sex: Treating RN: May 22, 1956 (65 y.o. Erie Noe Primary Care Teyonna Plaisted: Sammuel Hines Other Clinician: Referring Trig Mcbryar: Treating Dastan Krider/Extender: Seward Grater in Treatment: 1 Vital Signs Height(in): Pulse(bpm): 116 Weight(lbs): Blood Pressure(mmHg): 36/61 Body Mass Index(BMI): Temperature(F): 97.9 Respiratory Rate(breaths/min): 16 Photos: [N/A:N/A] Left Amputation Site - Toe N/A N/A Wound Location: Surgical Injury N/A N/A Wounding Event: Open Surgical Wound N/A N/A Primary Etiology: Chronic Obstructive Pulmonary N/A N/A Comorbid History: Disease (COPD), Sleep Apnea, Coronary Artery Disease, Hypertension, Type II Diabetes 12/27/2020 N/A N/A Date Acquired: 1 N/A N/A Weeks of Treatment: Open N/A N/A Wound Status: 1x0.6x1.2 N/A N/A  Measurements L x W x D (cm) 0.471 N/A N/A A (cm) : rea 0.565 N/A N/A Volume (cm) : 33.40% N/A N/A % Reduction in Area: 38.50% N/A N/A % Reduction in Volume: Full Thickness With Exposed Support N/A N/A Classification: Structures Medium N/A N/A Exudate Amount: Serosanguineous N/A N/A Exudate Type: red, brown N/A N/A Exudate Color: Well defined, not attached N/A N/A Wound Margin: Large (67-100%) N/A N/A Granulation Amount: Red, Pink, Hyper-granulation N/A N/A Granulation Quality: Small (1-33%) N/A N/A Necrotic Amount: Fat Layer (Subcutaneous Tissue): Yes N/A N/A Exposed Structures: Tendon: Yes Fascia: No Muscle: No Joint: No Bone: No Small (1-33%) N/A N/A Epithelialization: Treatment Notes Electronic Signature(s) Signed: 03/13/2021 4:16:06 PM By: Kalman Shan DO Signed: 03/18/2021 5:24:19 PM By: Rhae Hammock RN Entered By: Kalman Shan on 03/13/2021 16:12:22 -------------------------------------------------------------------------------- Multi-Disciplinary Care Plan Details Patient Name: Date of Service: [name]. 03/13/2021 3:30 PM Medical Record Number: [id number] Patient Account Number: [id number] Date of Birth/Sex: Treating RN: April 19, 1956 (65 y.o. Janyth Contes Primary Care Cruzita Lipa: Sammuel Hines Other Clinician: Referring  Rubena Roseman: Treating Pilar Westergaard/Extender: Seward Grater in Treatment: 1 Active Inactive Orientation to the Wound Care Program Nursing Diagnoses: Knowledge deficit related to the wound healing center program Goals: Patient/caregiver will verbalize understanding of the [place] Date Initiated: 03/06/2021 Target Resolution Date: 03/26/2021 Goal Status: Active Interventions: Provide education on orientation to the wound center Notes: Wound/Skin Impairment Nursing Diagnoses: Impaired tissue integrity Knowledge deficit related to ulceration/compromised skin integrity Goals: Patient will have a decrease in wound volume by X% from date: (specify in notes) Date Initiated: 03/06/2021 Target Resolution Date: 03/26/2021 Goal Status: Active Patient/caregiver will verbalize understanding of skin care regimen Date Initiated: 03/06/2021 Target Resolution Date: 03/26/2021 Goal Status: Active Ulcer/skin breakdown will have a volume reduction of 30% by week 4 Date Initiated: 03/06/2021 Target Resolution Date: 03/26/2021 Goal Status: Active Interventions: Assess patient/caregiver ability to obtain necessary supplies Assess patient/caregiver ability to perform ulcer/skin care regimen upon admission and as needed Assess ulceration(s) every visit Notes: Electronic Signature(s) Signed: 03/13/2021 4:57:56 PM By: Levan Hurst RN, BSN Entered By: Levan Hurst on 03/13/2021 16:36:05 -------------------------------------------------------------------------------- Pain Assessment Details Patient Name: Date of Service: [name] 03/13/2021 3:30 PM Medical Record Number: [id number] Patient Account Number: [id number] Date of Birth/Sex: Treating RN: [birth year] (65 y.o. Janyth Contes Primary Care Sarin Comunale: Sammuel Hines Other Clinician: Referring Caridad Silveira: Treating Emmanuella Mirante/Extender: Seward Grater in Treatment: 1 Active  Problems Location of Pain Severity and Description of Pain Patient Has Paino No Site Locations Pain Management and Medication Current Pain Management: Electronic Signature(s) Signed: 03/13/2021 4:57:56 PM By: Levan Hurst RN, BSN Entered By: Levan Hurst on 03/13/2021 15:50:28 -------------------------------------------------------------------------------- Patient/Caregiver Education Details Patient Name: Date of Service: [name] 8/25/2022andnbsp3:30 PM Medical Record Number: [id number] Patient Account Number: [id number] Date of Birth/Gender: Treating RN: 1955-12-03 (65 y.o. Janyth Contes Primary Care Physician: Sammuel Hines Other Clinician: Referring Physician: Treating Physician/Extender: Seward Grater in Treatment: 1 Education Assessment Education Provided To: Patient Education Topics Provided Wound/Skin Impairment: Methods: Explain/Verbal Responses: State content correctly Electronic Signature(s) Signed: 03/13/2021 4:57:56 PM By: Levan Hurst RN, BSN Entered By: Levan Hurst on 03/13/2021 16:36:15 -------------------------------------------------------------------------------- Wound Assessment Details Patient Name: Date of Service: [name] 03/13/2021 3:30 PM Medical Record Number: [id number] Patient Account Number: [id number] Date of Birth/Sex: Treating RN: December 31, 1955 (65 y.o. Erie Noe Primary Care Kelissa Merlin: Sammuel Hines Other Clinician: Referring Rehmat Murtagh: Treating Emsley Custer/Extender: Seward Grater in Treatment:  1 Wound Status Wound Number: 5 Primary Open Surgical Wound Etiology: Wound Location: Left Amputation Site - Toe Wound Open Wounding Event: Surgical Injury Status: Date Acquired: 12/27/2020 Comorbid Chronic Obstructive Pulmonary Disease (COPD), Sleep Apnea, Weeks Of Treatment: 1 History: Coronary Artery Disease, Hypertension, Type II Diabetes Clustered Wound:  No Photos Wound Measurements Length: (cm) 1 Width: (cm) 0.6 Depth: (cm) 1.2 Area: (cm) 0.471 Volume: (cm) 0.565 % Reduction in Area: 33.4% % Reduction in Volume: 38.5% Epithelialization: Small (1-33%) Tunneling: No Undermining: No Wound Description Classification: Full Thickness With Exposed Support Structures Wound Margin: Well defined, not attached Exudate Amount: Medium Exudate Type: Serosanguineous Exudate Color: red, brown Foul Odor After Cleansing: No Slough/Fibrino Yes Wound Bed Granulation Amount: Large (67-100%) Exposed Structure Granulation Quality: Red, Pink, Hyper-granulation Fascia Exposed: No Necrotic Amount: Small (1-33%) Fat Layer (Subcutaneous Tissue) Exposed: Yes Necrotic Quality: Adherent Slough Tendon Exposed: Yes Muscle Exposed: No Joint Exposed: No Bone Exposed: No Treatment Notes Wound #5 (Amputation Site - Toe) Wound Laterality: Left Cleanser Soap and Water Discharge Instruction: May shower and wash wound with dial antibacterial soap and water prior to dressing change. Wound Cleanser Discharge Instruction: Cleanse the wound with wound cleanser prior to applying a clean dressing using gauze sponges, not tissue or cotton balls. Peri-Wound Care Topical Primary Dressing KerraCel Ag Gelling Fiber Dressing, 4x5 in (silver alginate) Discharge Instruction: Apply silver alginate to wound bed as instructed Secondary Dressing Woven Gauze Sponge, Non-Sterile 4x4 in Discharge Instruction: Apply over primary dressing as directed. ABD Pad, 5x9 Discharge Instruction: Apply over primary dressing as directed. Secured With [employer], 4.5x3.1 (in/yd) Discharge Instruction: Secure with Kerlix as directed. 42M Medipore H Soft Cloth Surgical T 4 x 2 (in/yd) ape Discharge Instruction: Secure dressing with tape as directed. Compression Wrap Compression Stockings Add-Ons Electronic Signature(s) Signed: 03/17/2021 3:13:54 PM By: Sandre Kitty Signed: 03/18/2021 5:24:19 PM By: Rhae Hammock RN Entered By: Sandre Kitty on 03/13/2021 15:47:55 -------------------------------------------------------------------------------- Vitals Details Patient Name: Date of Service: [name]. 03/13/2021 3:30 PM Medical Record Number: [id number] Patient Account Number: [id number] Date of Birth/Sex: Treating RN: 06/02/1956 (65 y.o. Janyth Contes Primary Care Nastassia Bazaldua: Sammuel Hines Other Clinician: Referring Cuca Benassi: Treating Corynn Solberg/Extender: Seward Grater in Treatment: 1 Vital Signs Time Taken: 15:37 Temperature (F): 97.9 Pulse (bpm): 116 Respiratory Rate (breaths/min): 16 Blood Pressure (mmHg): 98/61 Reference Range: 80 - 120 mg / dl Electronic Signature(s) Signed: 03/13/2021 4:57:56 PM By: Levan Hurst RN, BSN Entered By: Levan Hurst on 03/13/2021 15:50:20

## 2021-03-20 ENCOUNTER — Encounter (HOSPITAL_BASED_OUTPATIENT_CLINIC_OR_DEPARTMENT_OTHER): Payer: Medicare Other | Admitting: Internal Medicine

## 2021-03-25 ENCOUNTER — Telehealth: Payer: Self-pay | Admitting: *Deleted

## 2021-03-25 DIAGNOSIS — J449 Chronic obstructive pulmonary disease, unspecified: Secondary | ICD-10-CM | POA: Diagnosis not present

## 2021-03-25 NOTE — Telephone Encounter (Signed)
Called and spoke with patient concerning a letter from [facility] to extend hours for home health care.  Patient stated that he gave it to physician last visit.  Asked patient to have the insurance to fax our office the paper work to complete and return to them,verbalized understanding and said that he would.

## 2021-03-27 ENCOUNTER — Encounter (HOSPITAL_BASED_OUTPATIENT_CLINIC_OR_DEPARTMENT_OTHER): Payer: Medicare Other | Admitting: Internal Medicine

## 2021-03-28 DIAGNOSIS — S91109A Unspecified open wound of unspecified toe(s) without damage to nail, initial encounter: Secondary | ICD-10-CM | POA: Diagnosis not present

## 2021-03-30 DIAGNOSIS — G629 Polyneuropathy, unspecified: Secondary | ICD-10-CM | POA: Diagnosis not present

## 2021-03-30 DIAGNOSIS — J449 Chronic obstructive pulmonary disease, unspecified: Secondary | ICD-10-CM | POA: Diagnosis not present

## 2021-03-30 DIAGNOSIS — E1140 Type 2 diabetes mellitus with diabetic neuropathy, unspecified: Secondary | ICD-10-CM | POA: Diagnosis not present

## 2021-03-30 DIAGNOSIS — G8929 Other chronic pain: Secondary | ICD-10-CM | POA: Diagnosis not present

## 2021-04-02 ENCOUNTER — Telehealth: Payer: Self-pay | Admitting: Podiatry

## 2021-04-02 NOTE — Telephone Encounter (Signed)
Patient called the office this today wanting to give you some information that he stated ou needed about [facility].    Please advise

## 2021-04-03 ENCOUNTER — Other Ambulatory Visit: Payer: Self-pay

## 2021-04-03 ENCOUNTER — Encounter (HOSPITAL_BASED_OUTPATIENT_CLINIC_OR_DEPARTMENT_OTHER): Payer: Medicare Other | Attending: Internal Medicine | Admitting: Internal Medicine

## 2021-04-03 DIAGNOSIS — N183 Chronic kidney disease, stage 3 unspecified: Secondary | ICD-10-CM | POA: Diagnosis not present

## 2021-04-03 DIAGNOSIS — Z9981 Dependence on supplemental oxygen: Secondary | ICD-10-CM | POA: Insufficient documentation

## 2021-04-03 DIAGNOSIS — S91302D Unspecified open wound, left foot, subsequent encounter: Secondary | ICD-10-CM

## 2021-04-03 DIAGNOSIS — J449 Chronic obstructive pulmonary disease, unspecified: Secondary | ICD-10-CM | POA: Diagnosis not present

## 2021-04-03 DIAGNOSIS — Z8249 Family history of ischemic heart disease and other diseases of the circulatory system: Secondary | ICD-10-CM | POA: Diagnosis not present

## 2021-04-03 DIAGNOSIS — Z833 Family history of diabetes mellitus: Secondary | ICD-10-CM | POA: Diagnosis not present

## 2021-04-03 DIAGNOSIS — Z888 Allergy status to other drugs, medicaments and biological substances status: Secondary | ICD-10-CM | POA: Insufficient documentation

## 2021-04-03 DIAGNOSIS — E1151 Type 2 diabetes mellitus with diabetic peripheral angiopathy without gangrene: Secondary | ICD-10-CM | POA: Insufficient documentation

## 2021-04-03 DIAGNOSIS — I130 Hypertensive heart and chronic kidney disease with heart failure and stage 1 through stage 4 chronic kidney disease, or unspecified chronic kidney disease: Secondary | ICD-10-CM | POA: Insufficient documentation

## 2021-04-03 DIAGNOSIS — I5032 Chronic diastolic (congestive) heart failure: Secondary | ICD-10-CM | POA: Insufficient documentation

## 2021-04-03 DIAGNOSIS — E11621 Type 2 diabetes mellitus with foot ulcer: Secondary | ICD-10-CM | POA: Diagnosis not present

## 2021-04-03 DIAGNOSIS — Z885 Allergy status to narcotic agent status: Secondary | ICD-10-CM | POA: Diagnosis not present

## 2021-04-03 DIAGNOSIS — Z794 Long term (current) use of insulin: Secondary | ICD-10-CM | POA: Insufficient documentation

## 2021-04-03 DIAGNOSIS — E1122 Type 2 diabetes mellitus with diabetic chronic kidney disease: Secondary | ICD-10-CM | POA: Insufficient documentation

## 2021-04-03 NOTE — Progress Notes (Signed)
[name] ([id number]) , Visit Report for 04/03/2021 Chief Complaint Document Details Patient Name: Date of Service: ASHA, SEARS 04/03/2021 9:30 A M Medical Record Number: [id number] Patient Account Number: [id number] Date of Birth/Sex: Treating RN: March 16, 1956 (65 y.o. [name], Lauren Primary Care Provider: Carolee Rota Other Clinician: Referring Provider: Treating Provider/Extender: Maryagnes Amos in Treatment: 4 Information Obtained from: Patient Chief Complaint non healing to the left great toe surgical wound site s/p amputation Electronic Signature(s) Signed: 04/03/2021 10:16:36 AM By: Kalman Shan DO Entered By: Kalman Shan on 04/03/2021 10:05:31 -------------------------------------------------------------------------------- HPI Details Patient Name: Date of Service: [name]. 04/03/2021 9:30 A M Medical Record Number: [id number] Patient Account Number: [id number] Date of Birth/Sex: Treating RN: August 10, 1955 (65 y.o. [name], Lauren Primary Care Provider: Carolee Rota Other Clinician: Referring Provider: Treating Provider/Extender: Maryagnes Amos in Treatment: 4 History of Present Illness HPI Description: Admission 8/18 Mr. Eriverto Lazur is a 65 year old male with a past medical history of uncontrolled insulin-dependent type 2 diabetes, COPD On O2 6 L via nasal cannula That presents to the clinic for a 73-monthhistory of nonhealing wound to the left great toe amputation site. He has been following with podiatry for a wound to his left great toe that developed osteomyelitis and subsequently required amputation. He had the surgery done on 6/10. He had a partial first ray amputation by Dr. PPosey Pronto Since his surgery he has been placing iodine on the wound bed. He states that there has been no improvement to the wound healing in the past 2 months. He states he has occasional intermittent pain to the  area. He denies systemic signs of infection. 8/25; patient presents for 1 week follow-up. He states he has been using silver alginate daily without issues. He has no complaints today. He denies signs of infection. He is scheduled to have his MRI on 8/27. 9/15; patient presents for follow-up. He missed his last clinic appointment due to feeling ill. He continues to use silver alginate daily without any issues. He reports improvement in wound healing. He currently denies signs of infection. Electronic Signature(s) Signed: 04/03/2021 10:16:36 AM By: HKalman ShanDO Entered By: HKalman Shanon 04/03/2021 10:06:02 -------------------------------------------------------------------------------- Physical Exam Details Patient Name: Date of Service: [name] 04/03/2021 9:30 A M Medical Record Number: [id number] Account Number: [id number] of Birth/Sex: Treating RN: 107/16/1957(65y.o. MErie NoePrimary Care Provider: DCarolee RotaOther Clinician: Referring Provider: Treating Provider/Extender: HMaryagnes Amosin Treatment: 4 Constitutional respirations regular, non-labored and within target range for patient..Marland KitchenPsychiatric pleasant and cooperative. Notes Left foot: Left great toe amputation site there is a small open wound with granulation tissue present. Very little depth noted on exam. No maceration or signs of infection. Electronic Signature(s) Signed: 04/03/2021 10:16:36 AM By: HKalman ShanDO Entered By: HKalman Shanon 04/03/2021 10:06:44 -------------------------------------------------------------------------------- Physician Orders Details Patient Name: Date of Service: [name] 04/03/2021 9:30 A M Medical Record Number: [id number] Account Number: [id number] of Birth/Sex: Treating RN: [birth year](65y.o. MBurnadette [name] Lauren Primary Care Provider: DCarolee RotaOther Clinician: Referring  Provider: Treating Provider/Extender: HMaryagnes Amosin Treatment: 4 Verbal / Phone Orders: No Diagnosis Coding ICD-10 Coding Code Description S91.302D Unspecified open wound, left foot, subsequent encounter E11.621 Type 2 diabetes mellitus with foot ulcer J44.9 Chronic obstructive pulmonary disease, unspecified M86.9 Osteomyelitis, unspecified Follow-up Appointments ppointment in 2 weeks. - Dr. HHeber Carolinaon Tuesday Return A Bathing/ Shower/  Hygiene May shower with protection but do not get wound dressing(s) wet. - May use a cast protector to protect dressing in shower Edema Control - Lymphedema / SCD / Other Elevate legs to the level of the heart or above for 30 minutes daily and/or when sitting, a frequency of: - throughout the day Avoid standing for long periods of time. Off-Loading Open toe surgical shoe to: - left foot Wound Treatment Wound #5 - Amputation Site - Toe Wound Laterality: Left Cleanser: Soap and Water 1 x Per Day/15 Days Discharge Instructions: May shower and wash wound with dial antibacterial soap and water prior to dressing change. Cleanser: Wound Cleanser (Generic) 1 x Per Day/15 Days Discharge Instructions: Cleanse the wound with wound cleanser prior to applying a clean dressing using gauze sponges, not tissue or cotton balls. Prim Dressing: KerraCel Ag Gelling Fiber Dressing, 4x5 in (silver alginate) (Generic) 1 x Per Day/15 Days ary Discharge Instructions: Apply silver alginate to wound bed as instructed Secondary Dressing: Woven Gauze Sponge, Non-Sterile 4x4 in (Generic) 1 x Per Day/15 Days Discharge Instructions: Apply over primary dressing as directed. Secondary Dressing: ABD Pad, 5x9 1 x Per Day/15 Days Discharge Instructions: Apply over primary dressing as directed. Secured With: [employer], 4.5x3.1 (in/yd) (Generic) 1 x Per Day/15 Days Discharge Instructions: Secure with Kerlix as directed. Secured With: 18M Medipore H  Soft Cloth Surgical T 4 x 2 (in/yd) (Generic) 1 x Per Day/15 Days ape Discharge Instructions: Secure dressing with tape as directed. Electronic Signature(s) Signed: 04/03/2021 10:16:36 AM By: Kalman Shan DO Entered By: Kalman Shan on 04/03/2021 10:07:16 -------------------------------------------------------------------------------- Problem List Details Patient Name: Date of Service: [name]. 04/03/2021 9:30 A M Medical Record Number: [id number] Patient Account Number: [id number] Date of Birth/Sex: Treating RN: 26-Dec-1955 (65 y.o. [name], Lauren Primary Care Provider: Carolee Rota Other Clinician: Referring Provider: Treating Provider/Extender: Maryagnes Amos in Treatment: 4 Active Problems ICD-10 Encounter Code Description Active Date MDM Diagnosis S91.302D Unspecified open wound, left foot, subsequent encounter 03/13/2021 No Yes E11.621 Type 2 diabetes mellitus with foot ulcer 03/06/2021 No Yes J44.9 Chronic obstructive pulmonary disease, unspecified 03/06/2021 No Yes Inactive Problems ICD-10 Code Description Active Date Inactive Date S91.302A Unspecified open wound, left foot, initial encounter 03/06/2021 03/06/2021 Resolved Problems Electronic Signature(s) Signed: 04/03/2021 10:16:36 AM By: Kalman Shan DO Entered By: Kalman Shan on 04/03/2021 10:15:34 -------------------------------------------------------------------------------- Progress Note Details Patient Name: Date of Service: [name]. 04/03/2021 9:30 A M Medical Record Number: [id number] Patient Account Number: [id number] Date of Birth/Sex: Treating RN: 04/27/1956 (65 y.o. [name], Lauren Primary Care Provider: Carolee Rota Other Clinician: Referring Provider: Treating Provider/Extender: Maryagnes Amos in Treatment: 4 Subjective Chief Complaint Information obtained from Patient non healing to the left great toe surgical  wound site s/p amputation History of Present Illness (HPI) Admission 8/18 Mr. Summit Gromek is a 65 year old male with a past medical history of uncontrolled insulin-dependent type 2 diabetes, COPD On O2 6 L via nasal cannula That presents to the clinic for a 58-monthhistory of nonhealing wound to the left great toe amputation site. He has been following with podiatry for a wound to his left great toe that developed osteomyelitis and subsequently required amputation. He had the surgery done on 6/10. He had a partial first ray amputation by Dr. PPosey Pronto Since his surgery he has been placing iodine on the wound bed. He states that there has been no improvement to the wound healing in the past 2 months.  He states he has occasional intermittent pain to the area. He denies systemic signs of infection. 8/25; patient presents for 1 week follow-up. He states he has been using silver alginate daily without issues. He has no complaints today. He denies signs of infection. He is scheduled to have his MRI on 8/27. 9/15; patient presents for follow-up. He missed his last clinic appointment due to feeling ill. He continues to use silver alginate daily without any issues. He reports improvement in wound healing. He currently denies signs of infection. Patient History Information obtained from Patient. Family History Cancer - Maternal Grandparents,Paternal Grandparents,Father,Mother, Diabetes - Mother,Father, Heart Disease - Maternal Grandparents,Mother,Father, Kidney Disease - Maternal Grandparents, Lung Disease - Father, Stroke - Paternal Grandparents,Maternal Grandparents,Mother, No family history of Hereditary Spherocytosis, Hypertension, Seizures, Thyroid Problems, Tuberculosis. Social History Current every day smoker - 1/2 pack a day, Alcohol Use - Never, Drug Use - No History, Caffeine Use - Daily - coffee, tea. Medical History Respiratory Patient has history of Chronic Obstructive Pulmonary Disease  (COPD), Sleep Apnea - does not use CPAP Cardiovascular Patient has history of Coronary Artery Disease - mx vessel, Hypertension Gastrointestinal Denies history of Cirrhosis , Colitis, Crohnoos, Hepatitis A, Hepatitis B, Hepatitis C Endocrine Patient has history of Type II Diabetes - last A1c - 13.3 Hospitalization/Surgery History - appendectomy. - cholecystectomy. - laproscopic nephrectomy. - lapro lysis of lesions. - thorascopy. - lung biopsy. - bronchoscopy. - (L) heart cath with angio X3. - endarcdectomy. - patch angioplasty. - left great toe amp. December 27, 2020. Medical A Surgical History Notes nd Constitutional Symptoms (General Health) noncompliance , cocaine abuse ,hepatitis ,headaches Respiratory h/o pneumonia , end stage chronic lung disease , COPD/ILD Cardiovascular h/o CVA , hyperlipidemia , coronary atherosclerosis , acute on chronic diastolic heart failure LVEF 60%-65% Gastrointestinal cholecystitis , GERD Genitourinary h/o nephrolithiasis , h/o renal cell carcinoma , CKD Stage 3 Musculoskeletal arthritis Psychiatric anxiety , depression Objective Constitutional respirations regular, non-labored and within target range for patient.. Vitals Time Taken: 9:39 AM, Temperature: 98.7 F, Pulse: 87 bpm, Respiratory Rate: 16 breaths/min, Blood Pressure: 90/60 mmHg. Psychiatric pleasant and cooperative. General Notes: Left foot: Left great toe amputation site there is a small open wound with granulation tissue present. Very little depth noted on exam. No maceration or signs of infection. Integumentary (Hair, Skin) Wound #5 status is Open. Original cause of wound was Surgical Injury. The date acquired was: 12/27/2020. The wound has been in treatment 4 weeks. The wound is located on the Left Amputation Site - T The wound measures 0.5cm length x 0.4cm width x 0.3cm depth; 0.157cm^2 area and 0.047cm^3 volume. oe. There is tendon and Fat Layer (Subcutaneous Tissue) exposed. There  is a medium amount of serosanguineous drainage noted. The wound margin is well defined and not attached to the wound base. There is large (67-100%) red, pink, hyper - granulation within the wound bed. There is a small (1-33%) amount of necrotic tissue within the wound bed including Adherent Slough. Assessment Active Problems ICD-10 Unspecified open wound, left foot, subsequent encounter Type 2 diabetes mellitus with foot ulcer Chronic obstructive pulmonary disease, unspecified Patient has shown dramatic improvement since last clinic visit. The wound is much smaller and close to healed. He had an MRI on 8/27 that showed bone marrow edema that could be reactive marrow changes versus early osteomyelitis. Since he has done so well this favors the image findings to be reactive marrow changes. I recommended he continue to do silver alginate daily dressing changes. Follow-up  in 2 weeks and I am hopeful this will be healed by then. Plan Follow-up Appointments: Return Appointment in 2 weeks. - Dr. Heber Trona on Tuesday Bathing/ Shower/ Hygiene: May shower with protection but do not get wound dressing(s) wet. - May use a cast protector to protect dressing in shower Edema Control - Lymphedema / SCD / Other: Elevate legs to the level of the heart or above for 30 minutes daily and/or when sitting, a frequency of: - throughout the day Avoid standing for long periods of time. Off-Loading: Open toe surgical shoe to: - left foot WOUND #5: - Amputation Site - T oe Wound Laterality: Left Cleanser: Soap and Water 1 x Per Day/15 Days Discharge Instructions: May shower and wash wound with dial antibacterial soap and water prior to dressing change. Cleanser: Wound Cleanser (Generic) 1 x Per Day/15 Days Discharge Instructions: Cleanse the wound with wound cleanser prior to applying a clean dressing using gauze sponges, not tissue or cotton balls. Prim Dressing: KerraCel Ag Gelling Fiber Dressing, 4x5 in (silver  alginate) (Generic) 1 x Per Day/15 Days ary Discharge Instructions: Apply silver alginate to wound bed as instructed Secondary Dressing: Woven Gauze Sponge, Non-Sterile 4x4 in (Generic) 1 x Per Day/15 Days Discharge Instructions: Apply over primary dressing as directed. Secondary Dressing: ABD Pad, 5x9 1 x Per Day/15 Days Discharge Instructions: Apply over primary dressing as directed. Secured With: [employer], 4.5x3.1 (in/yd) (Generic) 1 x Per Day/15 Days Discharge Instructions: Secure with Kerlix as directed. Secured With: 60M Medipore H Soft Cloth Surgical T 4 x 2 (in/yd) (Generic) 1 x Per Day/15 Days ape Discharge Instructions: Secure dressing with tape as directed. 1. Continue silver alginate daily 2. Follow-up in 2 weeks Electronic Signature(s) Signed: 04/03/2021 10:16:36 AM By: Kalman Shan DO Entered By: Kalman Shan on 04/03/2021 10:16:09 -------------------------------------------------------------------------------- HxROS Details Patient Name: Date of Service: [name]. 04/03/2021 9:30 A M Medical Record Number: [id number] Patient Account Number: [id number] Date of Birth/Sex: Treating RN: 03/30/1956 (65 y.o. [name], Lauren Primary Care Provider: Carolee Rota Other Clinician: Referring Provider: Treating Provider/Extender: Maryagnes Amos in Treatment: 4 Information Obtained From Patient Constitutional Symptoms (General Health) Medical History: Past Medical History Notes: noncompliance , cocaine abuse ,hepatitis ,headaches Respiratory Medical History: Positive for: Chronic Obstructive Pulmonary Disease (COPD); Sleep Apnea - does not use CPAP Past Medical History Notes: h/o pneumonia , end stage chronic lung disease , COPD/ILD Cardiovascular Medical History: Positive for: Coronary Artery Disease - mx vessel; Hypertension Past Medical History Notes: h/o CVA , hyperlipidemia , coronary atherosclerosis , acute on  chronic diastolic heart failure LVEF 60%-65% Gastrointestinal Medical History: Negative for: Cirrhosis ; Colitis; Crohns; Hepatitis A; Hepatitis B; Hepatitis C Past Medical History Notes: cholecystitis , GERD Endocrine Medical History: Positive for: Type II Diabetes - last A1c - 13.3 Genitourinary Medical History: Past Medical History Notes: h/o nephrolithiasis , h/o renal cell carcinoma , CKD Stage 3 Musculoskeletal Medical History: Past Medical History Notes: arthritis Psychiatric Medical History: Past Medical History Notes: anxiety , depression Immunizations Pneumococcal Vaccine: Received Pneumococcal Vaccination: Yes Received Pneumococcal Vaccination On or After 60th Birthday: Yes Implantable Devices None Hospitalization / Surgery History Type of Hospitalization/Surgery appendectomy cholecystectomy laproscopic nephrectomy lapro lysis of lesions thorascopy lung biopsy bronchoscopy (L) heart cath with angio X3 endarcdectomy patch angioplasty left great toe amp. December 27, 2020 Family and Social History Cancer: Yes - Maternal Grandparents,Paternal Grandparents,Father,Mother; Diabetes: Yes - Mother,Father; Heart Disease: Yes - Maternal Grandparents,Mother,Father; Hereditary Spherocytosis: No; Hypertension: No; Kidney Disease:  Yes - Maternal Grandparents; Lung Disease: Yes - Father; Seizures: No; Stroke: Yes - Paternal Grandparents,Maternal Grandparents,Mother; Thyroid Problems: No; Tuberculosis: No; Current every day smoker - 1/2 pack a day; Alcohol Use: Never; Drug Use: No History; Caffeine Use: Daily - coffee, tea; Financial Concerns: No; Food, Clothing or Shelter Needs: No; Support System Lacking: No; Transportation Concerns: No Electronic Signature(s) Signed: 04/03/2021 10:16:36 AM By: Kalman Shan DO Signed: 04/03/2021 4:34:42 PM By: Rhae Hammock RN Entered By: Kalman Shan on 04/03/2021  10:06:09 -------------------------------------------------------------------------------- SuperBill Details Patient Name: Date of Service: [name]. 04/03/2021 Medical Record Number: [id number] Patient Account Number: [id number] Date of Birth/Sex: Treating RN: 1955/10/17 (65 y.o. [name], Lauren Primary Care Provider: Carolee Rota Other Clinician: Referring Provider: Treating Provider/Extender: Maryagnes Amos in Treatment: 4 Diagnosis Coding ICD-10 Codes Code Description [phone] Unspecified open wound, left foot, subsequent encounter E11.621 Type 2 diabetes mellitus with foot ulcer J44.9 Chronic obstructive pulmonary disease, unspecified Facility Procedures CPT4 Code: [id number] Description: 99213 - WOUND CARE VISIT-LEV 3 EST PT Modifier: Quantity: 1 Physician Procedures : CPT4 Code Description Modifier [id number] - WC PHYS LEVEL 3 - EST PT ICD-10 Diagnosis Description S91.302D Unspecified open wound, left foot, subsequent encounter E11.621 Type 2 diabetes mellitus with foot ulcer J44.9 Chronic obstructive pulmonary  disease, unspecified Quantity: 1 Electronic Signature(s) Signed: 04/03/2021 10:16:36 AM By: Kalman Shan DO Entered By: Kalman Shan on 04/03/2021 10:16:18

## 2021-04-03 NOTE — Progress Notes (Signed)
[name] ([id number]) , Visit Report for 04/03/2021 Arrival Information Details Patient Name: Date of Service: [name], [name] 04/03/2021 9:30 A M Medical Record Number: [id number] Patient Account Number: [id number] Date of Birth/Sex: Treating RN: 1955-12-03 (65 y.o. [name], Lauren Primary Care Yurem Viner: Carolee Rota Other Clinician: Referring Quintasia Theroux: Treating Gilberto Stanforth/Extender: Maryagnes Amos in Treatment: 4 Visit Information History Since Last Visit Added or deleted any medications: No Patient Arrived: [name] Any new allergies or adverse reactions: No Arrival Time: 09:36 Had a fall or experienced change in No Accompanied By: self activities of daily living that may affect Transfer Assistance: None risk of falls: Patient Identification Verified: Yes Signs or symptoms of abuse/neglect since last visito No Secondary Verification Process Completed: Yes Hospitalized since last visit: No Patient Requires Transmission-Based Precautions: No Implantable device outside of the clinic excluding No Patient Has Alerts: Yes cellular tissue based products placed in the center Patient Alerts: ABI's: R: 0.82 L:0.76 since last visit: Has Dressing in Place as Prescribed: Yes Pain Present Now: Yes Electronic Signature(s) Signed: 04/03/2021 11:03:59 AM By: Sandre Kitty Entered By: Sandre Kitty on 04/03/2021 09:36:29 -------------------------------------------------------------------------------- Clinic Level of Care Assessment Details Patient Name: Date of Service: [name] 04/03/2021 9:30 A M Medical Record Number: [id number] Patient Account Number: [id number] Date of Birth/Sex: Treating RN: 1955/11/28 (65 y.o. [name], Lauren Primary Care Jenni Thew: Carolee Rota Other Clinician: Referring Timmie Calix: Treating Melvena Vink/Extender: Maryagnes Amos in Treatment: 4 Clinic Level of Care Assessment Items TOOL 4  Quantity Score X- 1 0 Use when only an EandM is performed on FOLLOW-UP visit ASSESSMENTS - Nursing Assessment / Reassessment X- 1 10 Reassessment of Co-morbidities (includes updates in patient status) X- 1 5 Reassessment of Adherence to Treatment Plan ASSESSMENTS - Wound and Skin A ssessment / Reassessment X - Simple Wound Assessment / Reassessment - one wound 1 5 '[]'$  - 0 Complex Wound Assessment / Reassessment - multiple wounds X- 1 10 Dermatologic / Skin Assessment (not related to wound area) ASSESSMENTS - Focused Assessment '[]'$  - 0 Circumferential Edema Measurements - multi extremities '[]'$  - 0 Nutritional Assessment / Counseling / Intervention '[]'$  - 0 Lower Extremity Assessment (monofilament, tuning fork, pulses) '[]'$  - 0 Peripheral Arterial Disease Assessment (using hand held doppler) ASSESSMENTS - Ostomy and/or Continence Assessment and Care '[]'$  - 0 Incontinence Assessment and Management '[]'$  - 0 Ostomy Care Assessment and Management (repouching, etc.) PROCESS - Coordination of Care X - Simple Patient / Family Education for ongoing care 1 15 '[]'$  - 0 Complex (extensive) Patient / Family Education for ongoing care X- 1 10 Staff obtains Programmer, systems, Records, T Results / Process Orders est '[]'$  - 0 Staff telephones HHA, Nursing Homes / Clarify orders / etc '[]'$  - 0 Routine Transfer to another Facility (non-emergent condition) '[]'$  - 0 Routine Hospital Admission (non-emergent condition) '[]'$  - 0 New Admissions / Biomedical engineer / Ordering NPWT Apligraf, etc. , '[]'$  - 0 Emergency Hospital Admission (emergent condition) X- 1 10 Simple Discharge Coordination '[]'$  - 0 Complex (extensive) Discharge Coordination PROCESS - Special Needs '[]'$  - 0 Pediatric / Minor Patient Management '[]'$  - 0 Isolation Patient Management '[]'$  - 0 Hearing / Language / Visual special needs '[]'$  - 0 Assessment of Community assistance (transportation, D/C planning, etc.) '[]'$  - 0 Additional assistance / Altered  mentation '[]'$  - 0 Support Surface(s) Assessment (bed, cushion, seat, etc.) INTERVENTIONS - Wound Cleansing / Measurement X - Simple Wound Cleansing - one wound 1 5 '[]'$  - 0 Complex  Wound Cleansing - multiple wounds X- 1 5 Wound Imaging (photographs - any number of wounds) '[]'$  - 0 Wound Tracing (instead of photographs) X- 1 5 Simple Wound Measurement - one wound '[]'$  - 0 Complex Wound Measurement - multiple wounds INTERVENTIONS - Wound Dressings X - Small Wound Dressing one or multiple wounds 1 10 '[]'$  - 0 Medium Wound Dressing one or multiple wounds '[]'$  - 0 Large Wound Dressing one or multiple wounds '[]'$  - 0 Application of Medications - topical '[]'$  - 0 Application of Medications - injection INTERVENTIONS - Miscellaneous '[]'$  - 0 External ear exam '[]'$  - 0 Specimen Collection (cultures, biopsies, blood, body fluids, etc.) '[]'$  - 0 Specimen(s) / Culture(s) sent or taken to Lab for analysis '[]'$  - 0 Patient Transfer (multiple staff / Civil Service fast streamer / Similar devices) '[]'$  - 0 Simple Staple / Suture removal (25 or less) '[]'$  - 0 Complex Staple / Suture removal (26 or more) '[]'$  - 0 Hypo / Hyperglycemic Management (close monitor of Blood Glucose) '[]'$  - 0 Ankle / Brachial Index (ABI) - do not check if billed separately X- 1 5 Vital Signs Has the patient been seen at the hospital within the last three years: Yes Total Score: 95 Level Of Care: New/Established - Level 3 Electronic Signature(s) Signed: 04/03/2021 4:34:42 PM By: Rhae Hammock RN Entered By: Rhae Hammock on 04/03/2021 10:08:30 -------------------------------------------------------------------------------- Encounter Discharge Information Details Patient Name: Date of Service: [name]. 04/03/2021 9:30 A M Medical Record Number: [id number] Patient Account Number: [id number] Date of Birth/Sex: Treating RN: June 16, 1956 (65 y.o. [name], Lauren Primary Care Shenee Wignall: Carolee Rota Other Clinician: Referring  Rojelio Uhrich: Treating Monish Haliburton/Extender: Maryagnes Amos in Treatment: 4 Encounter Discharge Information Items Discharge Condition: Stable Ambulatory Status: Ambulatory Discharge Destination: Home Transportation: Private Auto Accompanied By: self Schedule Follow-up Appointment: Yes Clinical Summary of Care: Patient Declined Electronic Signature(s) Signed: 04/03/2021 4:34:42 PM By: Rhae Hammock RN Entered By: Rhae Hammock on 04/03/2021 10:11:11 -------------------------------------------------------------------------------- Lower Extremity Assessment Details Patient Name: Date of Service: [name]. 04/03/2021 9:30 A M Medical Record Number: [id number] Patient Account Number: [id number] Date of Birth/Sex: Treating RN: 05/05/1956 (65 y.o. [name], Lauren Primary Care Ellison Rieth: Carolee Rota Other Clinician: Referring Lorita Forinash: Treating Chrisy Hillebrand/Extender: Maryagnes Amos in Treatment: 4 Edema Assessment Assessed: [name]: Yes] [name]: No] Edema: [Left: Ye] [Right: s] Calf Left: Right: Point of Measurement: 37 cm From Medial Instep 37 cm Ankle Left: Right: Point of Measurement: 8 cm From Medial Instep 23 cm Vascular Assessment Pulses: Dorsalis Pedis Palpable: [Left:Yes] Posterior Tibial Palpable: [Left:Yes] Electronic Signature(s) Signed: 04/03/2021 4:34:42 PM By: Rhae Hammock RN Entered By: Rhae Hammock on 04/03/2021 09:57:48 -------------------------------------------------------------------------------- Multi Wound Chart Details Patient Name: Date of Service: [name]. 04/03/2021 9:30 A M Medical Record Number: [id number] Patient Account Number: [id number] Date of Birth/Sex: Treating RN: 1956-01-21 (65 y.o. [name], Lauren Primary Care Seith Aikey: Carolee Rota Other Clinician: Referring Cass Edinger: Treating Chamar Broughton/Extender: Maryagnes Amos in Treatment: 4 Vital  Signs Height(in): Pulse(bpm): 8 Weight(lbs): Blood Pressure(mmHg): 90/60 Body Mass Index(BMI): Temperature(F): 98.7 Respiratory Rate(breaths/min): 16 Photos: [N/A:N/A] Left Amputation Site - Toe N/A N/A Wound Location: Surgical Injury N/A N/A Wounding Event: Open Surgical Wound N/A N/A Primary Etiology: Chronic Obstructive Pulmonary N/A N/A Comorbid History: Disease (COPD), Sleep Apnea, Coronary Artery Disease, Hypertension, Type II Diabetes 12/27/2020 N/A N/A Date Acquired: 4 N/A N/A Weeks of Treatment: Open N/A N/A Wound Status: 0.5x0.4x0.3 N/A N/A Measurements L x W x D (cm) 0.157 N/A  N/A A (cm) : rea 0.047 N/A N/A Volume (cm) : 77.80% N/A N/A % Reduction in Area: 94.90% N/A N/A % Reduction in Volume: Full Thickness With Exposed Support N/A N/A Classification: Structures Medium N/A N/A Exudate Amount: Serosanguineous N/A N/A Exudate Type: red, brown N/A N/A Exudate Color: Well defined, not attached N/A N/A Wound Margin: Large (67-100%) N/A N/A Granulation Amount: Red, Pink, Hyper-granulation N/A N/A Granulation Quality: Small (1-33%) N/A N/A Necrotic Amount: Fat Layer (Subcutaneous Tissue): Yes N/A N/A Exposed Structures: Tendon: Yes Fascia: No Muscle: No Joint: No Bone: No Small (1-33%) N/A N/A Epithelialization: Treatment Notes Electronic Signature(s) Signed: 04/03/2021 10:16:36 AM By: Kalman Shan DO Signed: 04/03/2021 4:34:42 PM By: Rhae Hammock RN Entered By: Kalman Shan on 04/03/2021 10:05:16 -------------------------------------------------------------------------------- Multi-Disciplinary Care Plan Details Patient Name: Date of Service: [name]. 04/03/2021 9:30 A M Medical Record Number: [id number] Patient Account Number: [id number] Date of Birth/Sex: Treating RN: [birth year] (65 y.o. [name], Lauren Primary Care Mariko Nowakowski: Carolee Rota Other Clinician: Referring Marty Uy: Treating Channing Savich/Extender:  Maryagnes Amos in Treatment: 4 Active Inactive Wound/Skin Impairment Nursing Diagnoses: Impaired tissue integrity Knowledge deficit related to ulceration/compromised skin integrity Goals: Patient will have a decrease in wound volume by X% from date: (specify in notes) Date Initiated: 03/06/2021 Target Resolution Date: 03/26/2021 Goal Status: Active Patient/caregiver will verbalize understanding of skin care regimen Date Initiated: 03/06/2021 Target Resolution Date: 03/26/2021 Goal Status: Active Ulcer/skin breakdown will have a volume reduction of 30% by week 4 Date Initiated: 03/06/2021 Target Resolution Date: 03/26/2021 Goal Status: Active Interventions: Assess patient/caregiver ability to obtain necessary supplies Assess patient/caregiver ability to perform ulcer/skin care regimen upon admission and as needed Assess ulceration(s) every visit Notes: Electronic Signature(s) Signed: 04/03/2021 4:34:42 PM By: Rhae Hammock RN Entered By: Rhae Hammock on 04/03/2021 09:58:23 -------------------------------------------------------------------------------- Pain Assessment Details Patient Name: Date of Service: [name]. 04/03/2021 9:30 A M Medical Record Number: [id number] Patient Account Number: [id number] Date of Birth/Sex: Treating RN: 12/28/1955 (65 y.o. [name] Primary Care Jamilynn Whitacre: Carolee Rota Other Clinician: Referring Jill Ruppe: Treating Karmel Patricelli/Extender: Maryagnes Amos in Treatment: 4 Active Problems Location of Pain Severity and Description of Pain Patient Has Paino Yes Site Locations Rate the pain. Current Pain Level: 4 Pain Management and Medication Current Pain Management: Electronic Signature(s) Signed: 04/03/2021 11:03:59 AM By: Sandre Kitty Signed: 04/03/2021 4:34:42 PM By: Rhae Hammock RN Entered By: Sandre Kitty on 04/03/2021  09:39:43 -------------------------------------------------------------------------------- Patient/Caregiver Education Details Patient Name: Date of Service: [name], Kevin W. 9/15/2022andnbsp9:30 A M Medical Record Number: [id number] Patient Account Number: [id number] Date of Birth/Gender: Treating RN: 03-21-1956 (65 y.o. [name] Primary Care Physician: Carolee Rota Other Clinician: Referring Physician: Treating Physician/Extender: Maryagnes Amos in Treatment: 4 Education Assessment Education Provided To: Patient Education Topics Provided Wound/Skin Impairment: Methods: Explain/Verbal Responses: Reinforcements needed, State content correctly Electronic Signature(s) Signed: 04/03/2021 4:34:42 PM By: Rhae Hammock RN Entered By: Rhae Hammock on 04/03/2021 09:58:55 -------------------------------------------------------------------------------- Wound Assessment Details Patient Name: Date of Service: [name]. 04/03/2021 9:30 A M Medical Record Number: [id number] Patient Account Number: [id number] Date of Birth/Sex: Treating RN: 30-Apr-1956 (65 y.o. [name] Primary Care Juanitta Earnhardt: Carolee Rota Other Clinician: Referring Trell Secrist: Treating Nioka Thorington/Extender: Maryagnes Amos in Treatment: 4 Wound Status Wound Number: 5 Primary Open Surgical Wound Etiology: Wound Location: Left Amputation Site - Toe Wound Open Wounding Event: Surgical Injury Status: Date Acquired: 12/27/2020 Comorbid Chronic Obstructive Pulmonary Disease (COPD), Sleep Apnea, Weeks Of Treatment: 4  History: Coronary Artery Disease, Hypertension, Type II Diabetes Clustered Wound: No Photos Wound Measurements Length: (cm) 0.5 Width: (cm) 0.4 Depth: (cm) 0.3 Area: (cm) 0.157 Volume: (cm) 0.047 % Reduction in Area: 77.8% % Reduction in Volume: 94.9% Epithelialization: Small (1-33%) Wound Description Classification:  Full Thickness With Exposed Support Structures Wound Margin: Well defined, not attached Exudate Amount: Medium Exudate Type: Serosanguineous Exudate Color: red, brown Foul Odor After Cleansing: No Slough/Fibrino Yes Wound Bed Granulation Amount: Large (67-100%) Exposed Structure Granulation Quality: Red, Pink, Hyper-granulation Fascia Exposed: No Necrotic Amount: Small (1-33%) Fat Layer (Subcutaneous Tissue) Exposed: Yes Necrotic Quality: Adherent Slough Tendon Exposed: Yes Muscle Exposed: No Joint Exposed: No Bone Exposed: No Treatment Notes Wound #5 (Amputation Site - Toe) Wound Laterality: Left Cleanser Soap and Water Discharge Instruction: May shower and wash wound with dial antibacterial soap and water prior to dressing change. Wound Cleanser Discharge Instruction: Cleanse the wound with wound cleanser prior to applying a clean dressing using gauze sponges, not tissue or cotton balls. Peri-Wound Care Topical Primary Dressing KerraCel Ag Gelling Fiber Dressing, 4x5 in (silver alginate) Discharge Instruction: Apply silver alginate to wound bed as instructed Secondary Dressing Woven Gauze Sponge, Non-Sterile 4x4 in Discharge Instruction: Apply over primary dressing as directed. ABD Pad, 5x9 Discharge Instruction: Apply over primary dressing as directed. Secured With [employer], 4.5x3.1 (in/yd) Discharge Instruction: Secure with Kerlix as directed. 72M Medipore H Soft Cloth Surgical T 4 x 2 (in/yd) ape Discharge Instruction: Secure dressing with tape as directed. Compression Wrap Compression Stockings Add-Ons Electronic Signature(s) Signed: 04/03/2021 11:03:59 AM By: Sandre Kitty Signed: 04/03/2021 4:34:42 PM By: Rhae Hammock RN Entered By: Sandre Kitty on 04/03/2021 09:45:48 -------------------------------------------------------------------------------- Vitals Details Patient Name: Date of Service: [name]. 04/03/2021 9:30 A M Medical  Record Number: [id number] Patient Account Number: [id number] Date of Birth/Sex: Treating RN: 26-Oct-1955 (65 y.o. [name] Primary Care Darcie Mellone: Carolee Rota Other Clinician: Referring Rabiah Goeser: Treating Cheryel Kyte/Extender: Maryagnes Amos in Treatment: 4 Vital Signs Time Taken: 09:39 Temperature (F): 98.7 Pulse (bpm): 87 Respiratory Rate (breaths/min): 16 Blood Pressure (mmHg): 90/60 Reference Range: 80 - 120 mg / dl Electronic Signature(s) Signed: 04/03/2021 11:03:59 AM By: Sandre Kitty Entered By: Sandre Kitty on 04/03/2021 09:39:34

## 2021-04-09 ENCOUNTER — Ambulatory Visit (INDEPENDENT_AMBULATORY_CARE_PROVIDER_SITE_OTHER): Payer: Medicare Other | Admitting: Podiatry

## 2021-04-09 ENCOUNTER — Other Ambulatory Visit: Payer: Self-pay

## 2021-04-09 DIAGNOSIS — L97521 Non-pressure chronic ulcer of other part of left foot limited to breakdown of skin: Secondary | ICD-10-CM

## 2021-04-09 DIAGNOSIS — E1165 Type 2 diabetes mellitus with hyperglycemia: Secondary | ICD-10-CM | POA: Diagnosis not present

## 2021-04-09 DIAGNOSIS — E1149 Type 2 diabetes mellitus with other diabetic neurological complication: Secondary | ICD-10-CM | POA: Diagnosis not present

## 2021-04-09 DIAGNOSIS — IMO0002 Reserved for concepts with insufficient information to code with codable children: Secondary | ICD-10-CM

## 2021-04-09 NOTE — Progress Notes (Signed)
Subjective:  Patient ID: [name], male    DOB: March 09, 1956,  MRN: [id number]  Chief Complaint  Patient presents with   Wound Check    Wound check     65 y.o. male presents for wound care.  Patient presents with left first MPJ wound status post partial first ray amputation.  He states is going to wound care center they are helping him close the wound pretty well.  He denies any other acute complaints.   Review of Systems: Negative except as noted in the HPI. Denies N/V/F/Ch.  Past Medical History:  Diagnosis Date   Anxiety    Arthritis    Cholecystitis, acute 12/20/2013   Lap chole 6/5   Cocaine abuse ([name])    COPD (chronic obstructive pulmonary disease) ([name])    Coronary atherosclerosis of native coronary artery    a. 03/09/2013 Cath/PCI: LM nl, LAD: 50p, 74m (2.5x16 promus DES), LCX nl, OM1 min irregs, LPL/LPDA diff dzs, RCA nondom, mod diff dzs, EF 55%.   Depression    Essential hypertension    GERD (gastroesophageal reflux disease)    Headache    Hepatitis Late 1970s   History of nephrolithiasis    History of pneumonia    History of stroke    Right MCA distribution, residual left-sided weakness   Hyperlipidemia    Noncompliance    Renal cell carcinoma ([name])    Status post radical right nephrectomy August 2015   Sleep apnea    On CPAP, 4L O2 no cpap at home yet   Type 2 diabetes mellitus ([place]) 2011    Current Outpatient Medications:    acetaminophen (TYLENOL) 325 MG tablet, Take 650 mg by mouth as needed (pain)., Disp: , Rfl:    ALPRAZolam (XANAX) 0.5 MG tablet, Take 1 tablet (0.5 mg total) by mouth 3 (three) times daily as needed for anxiety., Disp: , Rfl: 0   ALPRAZolam (XANAX) 1 MG tablet, Take 1 mg by mouth 3 (three) times daily as needed., Disp: , Rfl:    atorvastatin (LIPITOR) 20 MG tablet, Take 20 mg by mouth at bedtime., Disp: , Rfl: 1   Budeson-Glycopyrrol-Formoterol (BREZTRI AEROSPHERE) 160-9-4.8 MCG/ACT AERO, Inhale 2 puffs into the lungs in the  morning and at bedtime., Disp: 10.7 g, Rfl: 5   budesonide (PULMICORT) 1 MG/2ML nebulizer solution, Take 1 mg by nebulization as needed (shortness of breath)., Disp: , Rfl:    clopidogrel (PLAVIX) 75 MG tablet, TAKE ONE TABLET BY MOUTH EVERY DAY, Disp: 90 tablet, Rfl: 2   diltiazem (CARDIZEM CD) 180 MG 24 hr capsule, TAKE ONE CAPSULE BY MOUTH EVERY DAY, Disp: 90 capsule, Rfl: 2   diphenhydrAMINE (BENADRYL) 25 mg capsule, Take 25 mg by mouth every 6 (six) hours as needed for itching (take with percocet)., Disp: , Rfl:    docusate sodium (COLACE) 100 MG capsule, Take 200 mg by mouth at bedtime. , Disp: , Rfl:    esomeprazole (NEXIUM) 40 MG capsule, Take 40 mg by mouth at bedtime. Pt only take as needed, Disp: , Rfl:    FLUoxetine (PROZAC) 20 MG capsule, Take by mouth., Disp: , Rfl:    FLUoxetine (PROZAC) 40 MG capsule, Take 40 mg by mouth daily. Take with 10mg  capsule for a total of 50mg  daily., Disp: , Rfl:    formoterol (PERFOROMIST) 20 MCG/2ML nebulizer solution, Take 20 mcg by nebulization as needed (shortness of breath)., Disp: , Rfl:    gabapentin (NEURONTIN) 300 MG capsule, Take 900 mg by mouth 3 (  three) times daily., Disp: , Rfl:    glucose blood (TRUE METRIX BLOOD GLUCOSE TEST) test strip, , Disp: , Rfl:    insulin regular human CONCENTRATED (HUMULIN R) 500 UNIT/ML injection, Inject 90 Units into the skin 3 (three) times daily with meals., Disp: , Rfl:    isosorbide mononitrate (IMDUR) 60 MG 24 hr tablet, Take 1 tablet (60 mg total) by mouth daily., Disp: , Rfl:    linagliptin (TRADJENTA) 5 MG TABS tablet, Take 5 mg by mouth daily., Disp: , Rfl:    nitroGLYCERIN (NITROSTAT) 0.4 MG SL tablet, PLACE ONE tablet UNDER THE TONGUE every FIVE minutes as needed FOR CHEST PAIN. IF 3 TABLETS ARE NECESSARY CALL 911, Disp: 25 tablet, Rfl: 3   oxybutynin (DITROPAN-XL) 10 MG 24 hr tablet, Take 10 mg by mouth daily., Disp: , Rfl: 11   potassium chloride SA (K-DUR,KLOR-CON) 20 MEQ tablet, Take 20 mEq by  mouth 2 (two) times daily. , Disp: , Rfl:    pregabalin (LYRICA) 25 MG capsule, Take 25 mg by mouth 2 (two) times daily., Disp: , Rfl:    Probiotic Product (PROBIOTIC PO), Take 1 capsule by mouth daily., Disp: , Rfl:    sucralfate (CARAFATE) 1 g tablet, TAKE ONE TABLET BY MOUTH TWICE DAILY ON an empty stomach (Patient not taking: Reported on 02/26/2021), Disp: , Rfl:    tamsulosin (FLOMAX) 0.4 MG CAPS capsule, Take 0.4 mg by mouth daily. , Disp: , Rfl:    torsemide (DEMADEX) 20 MG tablet, TAKE THREE TABLETS BY MOUTH TWICE DAILY, Disp: 540 tablet, Rfl: 0   zolpidem (AMBIEN) 5 MG tablet, Take 5 mg by mouth at bedtime. For insomnia., Disp: , Rfl:   Social History   Tobacco Use  Smoking Status Some Days   Packs/day: 1.00   Years: 41.00   Pack years: 41.00   Types: Cigarettes   Start date: 05/03/1972  Smokeless Tobacco Never  Tobacco Comments   Smoking 5cigs per day as of 02/26/21    Allergies  Allergen Reactions   Lisinopril Other (See Comments)    Other reaction(s): worsening kidney function   Oxycodone Itching and Nausea Only    Pt is able to tolerate if taken with benadryl   Objective:  There were no vitals filed for this visit. There is no height or weight on file to calculate BMI. Constitutional Well developed. Well nourished.  Vascular Dorsalis pedis pulses palpable bilaterally. Posterior tibial pulses palpable bilaterally. Capillary refill normal to all digits.  No cyanosis or clubbing noted. Pedal hair growth normal.  Neurologic Normal speech. Oriented to person, place, and time. Protective sensation absent  Dermatologic Wound Location: Left first MPJ wound with limited to the breakdown of the skin.  No deep probing noted.  No complication noted.  Orthopedic: No pain to palpation either foot.   Radiographs: None Assessment:   1. Type II diabetes mellitus with neurological manifestations, uncontrolled ([place])   2. Skin ulcer of foot, left, limited to breakdown of skin  [facility])      Plan:  Patient was evaluated and treated and all questions answered.  Ulcer left first MPJ wound status post partial first ray amputation -Clinically doing well.  The wound is mostly completely epithelialized.  He is being managed in the wound care center primarily for this wound.  I will defer further care over to them.  I discussed with him that I will continue to follow him peripherally to make sure the wound is completely closed.  He states understanding.  Once he has completely epithelialized the wound we can discuss diabetic shoes with toe filler.  No follow-ups on file.

## 2021-04-15 ENCOUNTER — Other Ambulatory Visit: Payer: Self-pay

## 2021-04-15 ENCOUNTER — Encounter (HOSPITAL_BASED_OUTPATIENT_CLINIC_OR_DEPARTMENT_OTHER): Payer: Medicare Other | Admitting: Internal Medicine

## 2021-04-15 DIAGNOSIS — Z794 Long term (current) use of insulin: Secondary | ICD-10-CM | POA: Diagnosis not present

## 2021-04-15 DIAGNOSIS — Z888 Allergy status to other drugs, medicaments and biological substances status: Secondary | ICD-10-CM | POA: Diagnosis not present

## 2021-04-15 DIAGNOSIS — I130 Hypertensive heart and chronic kidney disease with heart failure and stage 1 through stage 4 chronic kidney disease, or unspecified chronic kidney disease: Secondary | ICD-10-CM | POA: Diagnosis not present

## 2021-04-15 DIAGNOSIS — S91302D Unspecified open wound, left foot, subsequent encounter: Secondary | ICD-10-CM | POA: Diagnosis not present

## 2021-04-15 DIAGNOSIS — J449 Chronic obstructive pulmonary disease, unspecified: Secondary | ICD-10-CM | POA: Diagnosis not present

## 2021-04-15 DIAGNOSIS — E1122 Type 2 diabetes mellitus with diabetic chronic kidney disease: Secondary | ICD-10-CM | POA: Diagnosis not present

## 2021-04-15 DIAGNOSIS — Z833 Family history of diabetes mellitus: Secondary | ICD-10-CM | POA: Diagnosis not present

## 2021-04-15 DIAGNOSIS — E1151 Type 2 diabetes mellitus with diabetic peripheral angiopathy without gangrene: Secondary | ICD-10-CM | POA: Diagnosis not present

## 2021-04-15 DIAGNOSIS — Z885 Allergy status to narcotic agent status: Secondary | ICD-10-CM | POA: Diagnosis not present

## 2021-04-15 DIAGNOSIS — E11621 Type 2 diabetes mellitus with foot ulcer: Secondary | ICD-10-CM

## 2021-04-15 DIAGNOSIS — N183 Chronic kidney disease, stage 3 unspecified: Secondary | ICD-10-CM | POA: Diagnosis not present

## 2021-04-15 DIAGNOSIS — Z9981 Dependence on supplemental oxygen: Secondary | ICD-10-CM | POA: Diagnosis not present

## 2021-04-15 DIAGNOSIS — Z8249 Family history of ischemic heart disease and other diseases of the circulatory system: Secondary | ICD-10-CM | POA: Diagnosis not present

## 2021-04-15 DIAGNOSIS — I5032 Chronic diastolic (congestive) heart failure: Secondary | ICD-10-CM | POA: Diagnosis not present

## 2021-04-15 NOTE — Progress Notes (Addendum)
[name] ([id number]) , Visit Report for 04/15/2021 Chief Complaint Document Details Patient Name: Date of Service: [name], [name] 04/15/2021 10:15 A M Medical Record Number: [id number] Patient Account Number: [id number] Date of Birth/Sex: Treating RN: [birth year] (65 y.o. [name] Primary Care Provider: Carolee Rota Other Clinician: Referring Provider: Treating Provider/Extender: Maryagnes Amos in Treatment: 5 Information Obtained from: Patient Chief Complaint non healing to the left great toe surgical wound site s/p amputation Electronic Signature(s) Signed: 04/15/2021 12:39:55 PM By: Kalman Shan DO Entered By: Kalman Shan on 04/15/2021 12:32:01 -------------------------------------------------------------------------------- HPI Details Patient Name: Date of Service: [name]. 04/15/2021 10:15 A M Medical Record Number: [id number] Patient Account Number: [id number] Date of Birth/Sex: Treating RN: 04-23-1956 (65 y.o. [name] Primary Care Provider: Carolee Rota Other Clinician: Referring Provider: Treating Provider/Extender: Maryagnes Amos in Treatment: 5 History of Present Illness HPI Description: Admission 8/18 Mr. Shiv Shuey is a 65 year old male with a past medical history of uncontrolled insulin-dependent type 2 diabetes, COPD On O2 6 L via nasal cannula That presents to the clinic for a 25-month history of nonhealing wound to the left great toe amputation site. He has been following with podiatry for a wound to his left great toe that developed osteomyelitis and subsequently required amputation. He had the surgery done on 6/10. He had a partial first ray amputation by Dr. Posey Pronto. Since his surgery he has been placing iodine on the wound bed. He states that there has been no improvement to the wound healing in the past 2 months. He states he has occasional intermittent pain to the area. He  denies systemic signs of infection. 8/25; patient presents for 1 week follow-up. He states he has been using silver alginate daily without issues. He has no complaints today. He denies signs of infection. He is scheduled to have his MRI on 8/27. 9/15; patient presents for follow-up. He missed his last clinic appointment due to feeling ill. He continues to use silver alginate daily without any issues. He reports improvement in wound healing. He currently denies signs of infection. 9/27; patient presents for follow-up. He has developed a new wound to the second left toe. He is unaware of this and was noted on exam today. He continues to use silver alginate to the amputation site wound. He has no issues or complaints today. He currently denies signs of infection. Electronic Signature(s) Signed: 04/15/2021 12:39:55 PM By: Kalman Shan DO Entered By: Kalman Shan on 04/15/2021 12:32:56 -------------------------------------------------------------------------------- Physical Exam Details Patient Name: Date of Service: [name], [name] 04/15/2021 10:15 A M Medical Record Number: [id number] Patient Account Number: [id number] Date of Birth/Sex: Treating RN: Jan 04, 1956 (65 y.o. [name] Primary Care Provider: Carolee Rota Other Clinician: Referring Provider: Treating Provider/Extender: Maryagnes Amos in Treatment: 5 Constitutional respirations regular, non-labored and within target range for patient.[name] Psychiatric pleasant and cooperative. Notes Left foot: T the left great toe amputation site there is a small open wound with granulation tissue present. Slight increase in depth compared to prior clinic visit. o No obvious signs of infection on exam. Distal portion of the second toe has an open wound with granulation tissue present. No signs of infection. Electronic Signature(s) Signed: 04/15/2021 12:39:55 PM By: Kalman Shan DO Entered By: Kalman Shan on 04/15/2021 12:34:35 -------------------------------------------------------------------------------- Physician Orders Details Patient Name: Date of Service: [name]. 04/15/2021 10:15 A M Medical Record Number: [id number] Patient Account Number: [id number] Date of Birth/Sex: Treating RN:  07/03/1956 (65 y.o. [name] Primary Care Provider: Carolee Rota Other Clinician: Referring Provider: Treating Provider/Extender: Maryagnes Amos in Treatment: 5 Verbal / Phone Orders: No Diagnosis Coding ICD-10 Coding Code Description S91.302D Unspecified open wound, left foot, subsequent encounter E11.621 Type 2 diabetes mellitus with foot ulcer J44.9 Chronic obstructive pulmonary disease, unspecified Follow-up Appointments ppointment in 2 weeks. - Dr. Heber [place] on Tuesday Return A Bathing/ Shower/ Hygiene May shower with protection but do not get wound dressing(s) wet. - May use a cast protector to protect dressing in shower Edema Control - Lymphedema / SCD / Other Elevate legs to the level of the heart or above for 30 minutes daily and/or when sitting, a frequency of: - throughout the day Avoid standing for long periods of time. Off-Loading Open toe surgical shoe to: - left foot Wound Treatment Wound #5 - Amputation Site - Toe Wound Laterality: Left Cleanser: Soap and Water 1 x Per Day/30 Days Discharge Instructions: May shower and wash wound with dial antibacterial soap and water prior to dressing change. Cleanser: Wound Cleanser (Generic) 1 x Per Day/30 Days Discharge Instructions: Cleanse the wound with wound cleanser prior to applying a clean dressing using gauze sponges, not tissue or cotton balls. Prim Dressing: KerraCel Ag Gelling Fiber Dressing, 4x5 in (silver alginate) (DME) (Generic) 1 x Per Day/30 Days ary Discharge Instructions: Apply silver alginate to wound bed as instructed Secondary Dressing: Woven Gauze Sponge, Non-Sterile 4x4  in (DME) (Generic) 1 x Per Day/30 Days Discharge Instructions: Apply over primary dressing as directed. Secured With: [employer], 4.5x3.1 (in/yd) (DME) (Generic) 1 x Per Day/30 Days Discharge Instructions: Secure with Kerlix as directed. Secured With: 57M Medipore H Soft Cloth Surgical Tape, 2x2 (in/yd) (DME) (Generic) 1 x Per Day/30 Days Discharge Instructions: Secure dressing with tape as directed. Wound #6 - T Second oe Wound Laterality: Left Prim Dressing: KerraCel Ag Gelling Fiber Dressing, 2x2 in (silver alginate) (DME) (Generic) 1 x Per Day/30 Days ary Discharge Instructions: Apply silver alginate to wound bed as instructed Secondary Dressing: Woven Gauze Sponges 2x2 in (DME) (Generic) 1 x Per Day/30 Days Discharge Instructions: Apply over primary dressing as directed. Secondary Dressing: Optifoam Non-Adhesive Dressing, 4x4 in (DME) (Generic) 1 x Per Day/30 Days Discharge Instructions: Apply over primary dressing to cushion tip of toe Secured With: Conforming Stretch Gauze Bandage, Sterile 2x75 (in/in) (DME) (Generic) 1 x Per Day/30 Days Discharge Instructions: Secure with stretch gauze as directed. Secured With: 57M Medipore H Soft Cloth Surgical Tape, 2x2 (in/yd) (DME) (Generic) 1 x Per Day/30 Days Discharge Instructions: Secure dressing with tape as directed. Consults Infectious Disease - diabetic foot ulcer of previous left great toe amputation site and left 2nd toe with possible osteomyelitis - (ICD10 E11.621 - Type 2 diabetes mellitus with foot ulcer) Electronic Signature(s) Signed: 04/15/2021 12:39:55 PM By: Kalman Shan DO Entered By: Kalman Shan on 04/15/2021 12:35:53 Prescription 04/15/2021 Hasten Monica Martinez -------------------------------------------------------------------------------- Kalman Shan DO Patient Name: Provider: 04-Jul-1956 [id number] Date of Birth: NPI#Jerilynn Mages [id number] Sex: DEA #: [phone] Phone #: License #: [place] Patient Address: [address] [place], [place] [address] [place], [place] [address] [phone] Allergies oxycodone; lisinopril Provider's Orders Infectious Disease - ICD10: E11.621 - diabetic foot ulcer of previous left great toe amputation site and left 2nd toe with possible osteomyelitis Hand Signature: Date(s): Electronic Signature(s) Signed: 04/15/2021 12:39:55 PM By: Kalman Shan DO Entered By: Kalman Shan  on 04/15/2021 12:35:54 -------------------------------------------------------------------------------- Problem List Details Patient Name: Date of Service: [name], [name] 04/15/2021 10:15 A M Medical Record Number: [id number] Patient Account Number: [id number] Date of Birth/Sex: Treating RN: 04/13/1956 (65 y.o. [name] Primary Care Provider: Carolee Rota Other Clinician: Referring Provider: Treating Provider/Extender: Maryagnes Amos in Treatment: 5 Active Problems ICD-10 Encounter Code Description Active Date MDM Diagnosis S91.302D Unspecified open wound, left foot, subsequent encounter 03/13/2021 No Yes E11.621 Type 2 diabetes mellitus with foot ulcer 03/06/2021 No Yes J44.9 Chronic obstructive pulmonary disease, unspecified 03/06/2021 No Yes Inactive Problems ICD-10 Code Description Active Date Inactive Date S91.302A Unspecified open wound, left foot, initial encounter 03/06/2021 03/06/2021 Resolved Problems Electronic Signature(s) Signed: 04/15/2021 12:39:55 PM By: Kalman Shan DO Entered By: Kalman Shan on 04/15/2021 12:31:42 -------------------------------------------------------------------------------- Progress Note Details Patient Name: Date of Service: [name]. 04/15/2021 10:15 A M Medical Record Number: [id number] Patient Account Number: [id number] Date of Birth/Sex: Treating RN: 26-Jul-1955 (65 y.o. [name], [name] Primary Care  Provider: Carolee Rota Other Clinician: Referring Provider: Treating Provider/Extender: Maryagnes Amos in Treatment: 5 Subjective Chief Complaint Information obtained from Patient non healing to the left great toe surgical wound site s/p amputation History of Present Illness (HPI) Admission 8/18 Mr. Delynn Pursley is a 65 year old male with a past medical history of uncontrolled insulin-dependent type 2 diabetes, COPD On O2 6 L via nasal cannula That presents to the clinic for a 65-month history of nonhealing wound to the left great toe amputation site. He has been following with podiatry for a wound to his left great toe that developed osteomyelitis and subsequently required amputation. He had the surgery done on 6/10. He had a partial first ray amputation by Dr. Posey Pronto. Since his surgery he has been placing iodine on the wound bed. He states that there has been no improvement to the wound healing in the past 2 months. He states he has occasional intermittent pain to the area. He denies systemic signs of infection. 8/25; patient presents for 1 week follow-up. He states he has been using silver alginate daily without issues. He has no complaints today. He denies signs of infection. He is scheduled to have his MRI on 8/27. 9/15; patient presents for follow-up. He missed his last clinic appointment due to feeling ill. He continues to use silver alginate daily without any issues. He reports improvement in wound healing. He currently denies signs of infection. 9/27; patient presents for follow-up. He has developed a new wound to the second left toe. He is unaware of this and was noted on exam today. He continues to use silver alginate to the amputation site wound. He has no issues or complaints today. He currently denies signs of infection. Patient History Information obtained from Patient. Family History Cancer - Maternal Grandparents,Paternal Grandparents,Father,Mother,  Diabetes - Mother,Father, Heart Disease - Maternal Grandparents,Mother,Father, Kidney Disease - Maternal Grandparents, Lung Disease - Father, Stroke - Paternal Grandparents,Maternal Grandparents,Mother, No family history of Hereditary Spherocytosis, Hypertension, Seizures, Thyroid Problems, Tuberculosis. Social History Current every day smoker - 1/2 pack a day, Alcohol Use - Never, Drug Use - No History, Caffeine Use - Daily - coffee, tea. Medical History Respiratory Patient has history of Chronic Obstructive Pulmonary Disease (COPD), Sleep Apnea - does not use CPAP Cardiovascular Patient has history of Coronary Artery Disease - mx vessel, Hypertension Gastrointestinal Denies history of Cirrhosis , Colitis, Crohnoos, Hepatitis A, Hepatitis B, Hepatitis C Endocrine Patient has history of Type II Diabetes - last A1c -  13.3 Hospitalization/Surgery History - appendectomy. - cholecystectomy. - laproscopic nephrectomy. - lapro lysis of lesions. - thorascopy. - lung biopsy. - bronchoscopy. - (L) heart cath with angio X3. - endarcdectomy. - patch angioplasty. - left great toe amp. December 27, 2020. Medical A Surgical History Notes nd Constitutional Symptoms (General Health) noncompliance , cocaine abuse ,hepatitis ,headaches Respiratory h/o pneumonia , end stage chronic lung disease , COPD/ILD Cardiovascular h/o CVA , hyperlipidemia , coronary atherosclerosis , acute on chronic diastolic heart failure LVEF 60%-65% Gastrointestinal cholecystitis , GERD Genitourinary h/o nephrolithiasis , h/o renal cell carcinoma , CKD Stage 3 Musculoskeletal arthritis Psychiatric anxiety , depression Objective Constitutional respirations regular, non-labored and within target range for patient.. Vitals Time Taken: 10:48 AM, Temperature: 97.9 F, Pulse: 99 bpm, Respiratory Rate: 16 breaths/min, Blood Pressure: 136/72 mmHg, Capillary Blood Glucose: 568 mg/dl. General Notes: glucose per pt report this am, pt  reports blood sugar has not been below 300 recently Psychiatric pleasant and cooperative. General Notes: Left foot: T the left great toe amputation site there is a small open wound with granulation tissue present. Slight increase in depth compared to o prior clinic visit. No obvious signs of infection on exam. Distal portion of the second toe has an open wound with granulation tissue present. No signs of infection. Integumentary (Hair, Skin) Wound #5 status is Open. Original cause of wound was Surgical Injury. The date acquired was: 12/27/2020. The wound has been in treatment 5 weeks. The wound is located on the Left Amputation Site - T The wound measures 0.2cm length x 0.2cm width x 0.7cm depth; 0.031cm^2 area and 0.022cm^3 volume. oe. There is tendon and Fat Layer (Subcutaneous Tissue) exposed. There is no tunneling noted, however, there is undermining starting at 12:00 and ending at 9:00 with a maximum distance of 0.3cm. There is a small amount of serosanguineous drainage noted. The wound margin is well defined and not attached to the wound base. There is large (67-100%) red, pink granulation within the wound bed. There is a small (1-33%) amount of necrotic tissue within the wound bed including Adherent Slough. Wound #6 status is Open. Original cause of wound was Gradually Appeared. The date acquired was: 04/15/2021. The wound is located on the Left T Second. oe The wound measures 0.3cm length x 0.4cm width x 0.2cm depth; 0.094cm^2 area and 0.019cm^3 volume. There is Fat Layer (Subcutaneous Tissue) exposed. There is no tunneling or undermining noted. There is a small amount of serosanguineous drainage noted. The wound margin is thickened. There is medium (34- 66%) pink granulation within the wound bed. There is a medium (34-66%) amount of necrotic tissue within the wound bed including Adherent Slough. Assessment Active Problems ICD-10 Unspecified open wound, left foot, subsequent  encounter Type 2 diabetes mellitus with foot ulcer Chronic obstructive pulmonary disease, unspecified Patient's wound is stable with slight increase in depth from last clinic visit. I recommended continuing silver alginate to this. He now has a new wound to the second left distal toe. I recommended also silver alginate and to relieve pressure from this area. With slight decrease in progress I will refer to infectious disease to see if there is any further recommendations for antibiotics due to possible early osteomyelitis noted on MRI. Plan Follow-up Appointments: Return Appointment in 2 weeks. - Dr. Heber [place] on Tuesday Bathing/ Shower/ Hygiene: May shower with protection but do not get wound dressing(s) wet. - May use a cast protector to protect dressing in shower Edema Control - Lymphedema / SCD / Other: Elevate  legs to the level of the heart or above for 30 minutes daily and/or when sitting, a frequency of: - throughout the day Avoid standing for long periods of time. Off-Loading: Open toe surgical shoe to: - left foot Consults ordered were: Infectious Disease - diabetic foot ulcer of previous left great toe amputation site and left 2nd toe with possible osteomyelitis WOUND #5: - Amputation Site - T oe Wound Laterality: Left Cleanser: Soap and Water 1 x Per Day/30 Days Discharge Instructions: May shower and wash wound with dial antibacterial soap and water prior to dressing change. Cleanser: Wound Cleanser (Generic) 1 x Per Day/30 Days Discharge Instructions: Cleanse the wound with wound cleanser prior to applying a clean dressing using gauze sponges, not tissue or cotton balls. Prim Dressing: KerraCel Ag Gelling Fiber Dressing, 4x5 in (silver alginate) (DME) (Generic) 1 x Per Day/30 Days ary Discharge Instructions: Apply silver alginate to wound bed as instructed Secondary Dressing: Woven Gauze Sponge, Non-Sterile 4x4 in (DME) (Generic) 1 x Per Day/30 Days Discharge Instructions: Apply  over primary dressing as directed. Secured With: [employer], 4.5x3.1 (in/yd) (DME) (Generic) 1 x Per Day/30 Days Discharge Instructions: Secure with Kerlix as directed. Secured With: 64M Medipore H Soft Cloth Surgical T ape, 2x2 (in/yd) (DME) (Generic) 1 x Per Day/30 Days Discharge Instructions: Secure dressing with tape as directed. WOUND #6: - T Second Wound Laterality: Left oe Prim Dressing: KerraCel Ag Gelling Fiber Dressing, 2x2 in (silver alginate) (DME) (Generic) 1 x Per Day/30 Days ary Discharge Instructions: Apply silver alginate to wound bed as instructed Secondary Dressing: Woven Gauze Sponges 2x2 in (DME) (Generic) 1 x Per Day/30 Days Discharge Instructions: Apply over primary dressing as directed. Secondary Dressing: Optifoam Non-Adhesive Dressing, 4x4 in (DME) (Generic) 1 x Per Day/30 Days Discharge Instructions: Apply over primary dressing to cushion tip of toe Secured With: Conforming Stretch Gauze Bandage, Sterile 2x75 (in/in) (DME) (Generic) 1 x Per Day/30 Days Discharge Instructions: Secure with stretch gauze as directed. Secured With: 64M Medipore H Soft Cloth Surgical T ape, 2x2 (in/yd) (DME) (Generic) 1 x Per Day/30 Days Discharge Instructions: Secure dressing with tape as directed. 1. Silver alginate 2. Infectious disease referral Electronic Signature(s) Signed: 04/15/2021 12:39:55 PM By: Kalman Shan DO Entered By: Kalman Shan on 04/15/2021 12:38:29 -------------------------------------------------------------------------------- HxROS Details Patient Name: Date of Service: [name]. 04/15/2021 10:15 A M Medical Record Number: [id number] Patient Account Number: [id number] Date of Birth/Sex: Treating RN: 08-Mar-1956 (65 y.o. [name] Primary Care Provider: Carolee Rota Other Clinician: Referring Provider: Treating Provider/Extender: Maryagnes Amos in Treatment: 5 Information Obtained  From Patient Constitutional Symptoms (General Health) Medical History: Past Medical History Notes: noncompliance , cocaine abuse ,hepatitis ,headaches Respiratory Medical History: Positive for: Chronic Obstructive Pulmonary Disease (COPD); Sleep Apnea - does not use CPAP Past Medical History Notes: h/o pneumonia , end stage chronic lung disease , COPD/ILD Cardiovascular Medical History: Positive for: Coronary Artery Disease - mx vessel; Hypertension Past Medical History Notes: h/o CVA , hyperlipidemia , coronary atherosclerosis , acute on chronic diastolic heart failure LVEF 60%-65% Gastrointestinal Medical History: Negative for: Cirrhosis ; Colitis; Crohns; Hepatitis A; Hepatitis B; Hepatitis C Past Medical History Notes: cholecystitis , GERD Endocrine Medical History: Positive for: Type II Diabetes - last A1c - 13.3 Genitourinary Medical History: Past Medical History Notes: h/o nephrolithiasis , h/o renal cell carcinoma , CKD Stage 3 Musculoskeletal Medical History: Past Medical History Notes: arthritis Psychiatric Medical History: Past Medical History Notes: anxiety , depression Immunizations  Pneumococcal Vaccine: Received Pneumococcal Vaccination: Yes Received Pneumococcal Vaccination On or After 60th Birthday: Yes Implantable Devices None Hospitalization / Surgery History Type of Hospitalization/Surgery appendectomy cholecystectomy laproscopic nephrectomy lapro lysis of lesions thorascopy lung biopsy bronchoscopy (L) heart cath with angio X3 endarcdectomy patch angioplasty left great toe amp. December 27, 2020 Family and Social History Cancer: Yes - Maternal Grandparents,Paternal Grandparents,Father,Mother; Diabetes: Yes - Mother,Father; Heart Disease: Yes - Maternal Grandparents,Mother,Father; Hereditary Spherocytosis: No; Hypertension: No; Kidney Disease: Yes - Maternal Grandparents; Lung Disease: Yes - Father; Seizures: No; Stroke: Yes - Paternal  Grandparents,Maternal Grandparents,Mother; Thyroid Problems: No; Tuberculosis: No; Current every day smoker - 1/2 pack a day; Alcohol Use: Never; Drug Use: No History; Caffeine Use: Daily - coffee, tea; Financial Concerns: No; Food, Clothing or Shelter Needs: No; Support System Lacking: No; Transportation Concerns: No Electronic Signature(s) Signed: 04/15/2021 12:39:55 PM By: Kalman Shan DO Signed: 04/15/2021 5:46:45 PM By: Deon Pilling RN, BSN Entered By: Kalman Shan on 04/15/2021 12:33:04 -------------------------------------------------------------------------------- SuperBill Details Patient Name: Date of Service: [name] 04/15/2021 Medical Record Number: [id number] Patient Account Number: [id number] Date of Birth/Sex: Treating RN: [birth year] (65 y.o. [name] Primary Care Provider: Carolee Rota Other Clinician: Referring Provider: Treating Provider/Extender: Maryagnes Amos in Treatment: 5 Diagnosis Coding ICD-10 Codes Code Description [phone] Unspecified open wound, left foot, subsequent encounter E11.621 Type 2 diabetes mellitus with foot ulcer J44.9 Chronic obstructive pulmonary disease, unspecified Facility Procedures CPT4 Code: [id number] Description: 99214 - WOUND CARE VISIT-LEV 4 EST PT Modifier: Quantity: 1 Physician Procedures : CPT4 Code Description Modifier [id number] - WC PHYS LEVEL 3 - EST PT ICD-10 Diagnosis Description S91.302D Unspecified open wound, left foot, subsequent encounter E11.621 Type 2 diabetes mellitus with foot ulcer J44.9 Chronic obstructive pulmonary  disease, unspecified Quantity: 1 Electronic Signature(s) Signed: 04/16/2021 12:34:10 PM By: Deon Pilling RN, BSN Signed: 04/16/2021 1:59:54 PM By: Kalman Shan DO Previous Signature: 04/15/2021 12:39:55 PM Version By: Kalman Shan DO Entered By: Deon Pilling on 04/16/2021 12:34:09

## 2021-04-16 NOTE — Progress Notes (Signed)
[name] ([id number]) , Visit Report for 04/15/2021 Arrival Information Details Patient Name: Date of Service: [name], [name] 04/15/2021 10:15 A M Medical Record Number: [id number] Patient Account Number: [id number] Date of Birth/Sex: Treating RN: 04-Feb-1956 (65 y.o. [name], [name] Primary Care Shyne Lehrke: Carolee Rota Other Clinician: Referring Meliyah Simon: Treating Mariano Doshi/Extender: Maryagnes Amos in Treatment: 5 Visit Information History Since Last Visit Added or deleted any medications: No Patient Arrived: Ambulatory Any new allergies or adverse reactions: No Arrival Time: 10:46 Had a fall or experienced change in No Accompanied By: self activities of daily living that may affect Transfer Assistance: None risk of falls: Patient Identification Verified: Yes Signs or symptoms of abuse/neglect since last visito No Secondary Verification Process Completed: Yes Hospitalized since last visit: No Patient Requires Transmission-Based Precautions: No Implantable device outside of the clinic excluding No Patient Has Alerts: Yes cellular tissue based products placed in the center Patient Alerts: ABI's: R: 0.82 L:0.76 since last visit: Has Dressing in Place as Prescribed: Yes Pain Present Now: No Electronic Signature(s) Signed: 04/15/2021 12:59:59 PM By: Sandre Kitty Entered By: Sandre Kitty on 04/15/2021 10:46:29 -------------------------------------------------------------------------------- Clinic Level of Care Assessment Details Patient Name: Date of Service: [name] 04/15/2021 10:15 A M Medical Record Number: [id number] Patient Account Number: [id number] Date of Birth/Sex: Treating RN: Nov 22, 1955 (65 y.o. [name], [name] Primary Care Terree Gaultney: Carolee Rota Other Clinician: Referring Jaila Schellhorn: Treating Taygan Connell/Extender: Maryagnes Amos in Treatment: 5 Clinic Level of Care Assessment Items TOOL 4  Quantity Score X- 1 0 Use when only an EandM is performed on FOLLOW-UP visit ASSESSMENTS - Nursing Assessment / Reassessment X- 1 10 Reassessment of Co-morbidities (includes updates in patient status) X- 1 5 Reassessment of Adherence to Treatment Plan ASSESSMENTS - Wound and Skin A ssessment / Reassessment _0  - 0 Simple Wound Assessment / Reassessment - one wound X- 2 5 Complex Wound Assessment / Reassessment - multiple wounds X- 1 10 Dermatologic / Skin Assessment (not related to wound area) ASSESSMENTS - Focused Assessment X- 1 5 Circumferential Edema Measurements - multi extremities X- 1 10 Nutritional Assessment / Counseling / Intervention _1  - 0 Lower Extremity Assessment (monofilament, tuning fork, pulses) _2  - 0 Peripheral Arterial Disease Assessment (using hand held doppler) ASSESSMENTS - Ostomy and/or Continence Assessment and Care _3  - 0 Incontinence Assessment and Management _4  - 0 Ostomy Care Assessment and Management (repouching, etc.) PROCESS - Coordination of Care _5  - 0 Simple Patient / Family Education for ongoing care X- 1 20 Complex (extensive) Patient / Family Education for ongoing care X- 1 10 Staff obtains Programmer, systems, Records, T Results / Process Orders est _6  - 0 Staff telephones HHA, Nursing Homes / Clarify orders / etc _7  - 0 Routine Transfer to another Facility (non-emergent condition) _8  - 0 Routine Hospital Admission (non-emergent condition) _9  - 0 New Admissions / Biomedical engineer / Ordering NPWT Apligraf, etc. , _10  - 0 Emergency Hospital Admission (emergent condition) _11  - 0 Simple Discharge Coordination X- 1 15 Complex (extensive) Discharge Coordination PROCESS - Special Needs _12  - 0 Pediatric / Minor Patient Management _13  - 0 Isolation Patient Management _14  - 0 Hearing / Language / Visual special needs _15  - 0 Assessment of Community assistance (transportation, D/C planning, etc.) _16  - 0 Additional assistance / Altered  mentation _17  - 0 Support Surface(s) Assessment (bed, cushion, seat, etc.) INTERVENTIONS - Wound Cleansing / Measurement _18  - 0 Simple Wound Cleansing - one wound X- 2 5 Complex Wound Cleansing -  multiple wounds X- 1 5 Wound Imaging (photographs - any number of wounds) _0  - 0 Wound Tracing (instead of photographs) _1  - 0 Simple Wound Measurement - one wound X- 2 5 Complex Wound Measurement - multiple wounds INTERVENTIONS - Wound Dressings X - Small Wound Dressing one or multiple wounds 2 10 _2  - 0 Medium Wound Dressing one or multiple wounds _3  - 0 Large Wound Dressing one or multiple wounds <GBTDVVOHYWVPXTGG>_2<\/IRSWNIOEVOJJKKXF>_8  - 0 Application of Medications - topical <HWEXHBZJIRCVELFY>_1<\/OFBPZWCHENIDPOEU>_2  - 0 Application of Medications - injection INTERVENTIONS - Miscellaneous _6  - 0 External ear exam _7  - 0 Specimen Collection (cultures, biopsies, blood, body fluids, etc.) _8  - 0 Specimen(s) / Culture(s) sent or taken to Lab for analysis _9  - 0 Patient Transfer (multiple staff / Civil Service fast streamer / Similar devices) _10  - 0 Simple Staple / Suture removal (25 or less) _11  - 0 Complex Staple / Suture removal (26 or more) _12  - 0 Hypo / Hyperglycemic Management (close monitor of Blood Glucose) _13  - 0 Ankle / Brachial Index (ABI) - do not check if billed separately X- 1 5 Vital Signs Has the patient been seen at the hospital within the last three years: Yes Total Score: 145 Level Of Care: New/Established - Level 4 Electronic Signature(s) Signed: 04/16/2021 5:54:54 PM By: Deon Pilling RN, BSN Entered By: Deon Pilling on 04/16/2021 12:33:54 -------------------------------------------------------------------------------- Lower Extremity Assessment Details Patient Name: Date of Service: [name]. 04/15/2021 10:15 A M Medical Record Number: [id number] Patient Account Number: [id number] Date of Birth/Sex: Treating RN: 1955/09/07 (65 y.o. [name] Primary Care Blu Lori: Carolee Rota Other Clinician: Referring Shenouda Genova: Treating  Uilani Sanville/Extender: Maryagnes Amos in Treatment: 5 Edema Assessment Assessed: [Left: No] [Right: No] Edema: [Left: N] [Right: o] Calf Left: Right: Point of Measurement: 37 cm From Medial Instep 37 cm Ankle Left: Right: Point of Measurement: 8 cm From Medial Instep 23 cm Vascular Assessment Pulses: Dorsalis Pedis Palpable: [Left:Yes] Electronic Signature(s) Signed: 04/16/2021 5:46:28 PM By: Baruch Gouty RN, BSN Entered By: Baruch Gouty on 04/15/2021 11:13:11 -------------------------------------------------------------------------------- Multi Wound Chart Details Patient Name: Date of Service: [name]. 04/15/2021 10:15 A M Medical Record Number: [id number] Patient Account Number: [id number] Date of Birth/Sex: Treating RN: 1956-06-01 (65 y.o. [name], [name] Primary Care Mell Mellott: Carolee Rota Other Clinician: Referring Mercury Rock: Treating Elin Seats/Extender: Maryagnes Amos in Treatment: 5 Vital Signs Height(in): Capillary Blood Glucose(mg/dl): 568 Weight(lbs): Pulse(bpm): 23 Body Mass Index(BMI): Blood Pressure(mmHg): 136/72 Temperature(F): 97.9 Respiratory Rate(breaths/min): 16 Photos: [N/A:N/A] Left Amputation Site - Toe Left T Second oe N/A Wound Location: Surgical Injury Gradually Appeared N/A Wounding Event: Open Surgical Wound Diabetic Wound/Ulcer of the Lower N/A Primary Etiology: Extremity Chronic Obstructive Pulmonary Chronic Obstructive Pulmonary N/A Comorbid History: Disease (COPD), Sleep Apnea, Disease (COPD), Sleep Apnea, Coronary Artery Disease, Coronary Artery Disease, Hypertension, Type II Diabetes Hypertension, Type II Diabetes 12/27/2020 04/15/2021 N/A Date Acquired: 5 0 N/A Weeks of Treatment: Open Open N/A Wound Status: 0.2x0.2x0.7 0.3x0.4x0.2 N/A Measurements L x W x D (cm) 0.031 0.094 N/A A (cm) : rea 0.022 0.019 N/A Volume (cm) : 95.60% 0.00% N/A % Reduction in A  rea: 97.60% 0.00% N/A % Reduction in Volume: 12 Starting Position 1 (o'clock): 9 Ending Position 1 (o'clock): 0.3 Maximum Distance 1 (cm): Yes No N/A Undermining: Full Thickness With Exposed Support Grade 1 N/A Classification: Structures Small Small N/A Exudate Amount: Serosanguineous Serosanguineous N/A Exudate Type: red, brown red, brown N/A Exudate Color: Well defined, not attached Thickened N/A Wound Margin:  Large (67-100%) Medium (34-66%) N/A Granulation Amount: Red, Pink Pink N/A Granulation Quality: Small (1-33%) Medium (34-66%) N/A Necrotic Amount: Fat Layer (Subcutaneous Tissue): Yes Fat Layer (Subcutaneous Tissue): Yes N/A Exposed Structures: Tendon: Yes Fascia: No Fascia: No Tendon: No Muscle: No Muscle: No Joint: No Joint: No Bone: No Bone: No Small (1-33%) None N/A Epithelialization: Treatment Notes Electronic Signature(s) Signed: 04/15/2021 12:39:55 PM By: Kalman Shan DO Signed: 04/15/2021 5:46:45 PM By: Deon Pilling RN, BSN Entered By: Kalman Shan on 04/15/2021 12:31:50 -------------------------------------------------------------------------------- Multi-Disciplinary Care Plan Details Patient Name: Date of Service: [name]. 04/15/2021 10:15 A M Medical Record Number: [id number] Patient Account Number: [id number] Date of Birth/Sex: Treating RN: 1956/01/18 (65 y.o. [name] Primary Care Kamesha Herne: Carolee Rota Other Clinician: Referring Dimitri Shakespeare: Treating Aijah Lattner/Extender: Maryagnes Amos in Treatment: 5 Multidisciplinary Care Plan reviewed with physician Active Inactive Wound/Skin Impairment Nursing Diagnoses: Impaired tissue integrity Knowledge deficit related to ulceration/compromised skin integrity Goals: Patient will have a decrease in wound volume by X% from date: (specify in notes) Date Initiated: 03/06/2021 Date Inactivated: 04/15/2021 Target Resolution Date: 03/26/2021 Goal  Status: Met Patient/caregiver will verbalize understanding of skin care regimen Date Initiated: 03/06/2021 Target Resolution Date: 04/22/2021 Goal Status: Active Ulcer/skin breakdown will have a volume reduction of 30% by week 4 Date Initiated: 03/06/2021 Date Inactivated: 04/15/2021 Target Resolution Date: 03/26/2021 Goal Status: Unmet Unmet Reason: probable osteomyelitis Ulcer/skin breakdown will have a volume reduction of 50% by week 8 Date Initiated: 04/15/2021 Target Resolution Date: 04/22/2021 Goal Status: Active Interventions: Assess patient/caregiver ability to obtain necessary supplies Assess patient/caregiver ability to perform ulcer/skin care regimen upon admission and as needed Assess ulceration(s) every visit Notes: Electronic Signature(s) Signed: 04/16/2021 5:46:28 PM By: Baruch Gouty RN, BSN Entered By: Baruch Gouty on 04/15/2021 11:15:28 -------------------------------------------------------------------------------- Pain Assessment Details Patient Name: Date of Service: [name]. 04/15/2021 10:15 A M Medical Record Number: [id number] Patient Account Number: [id number] Date of Birth/Sex: Treating RN: 26-Sep-1955 (65 y.o. [name] Primary Care Rowynn Mcweeney: Carolee Rota Other Clinician: Referring Sandra Brents: Treating Teller Wakefield/Extender: Maryagnes Amos in Treatment: 5 Active Problems Location of Pain Severity and Description of Pain Patient Has Paino No Site Locations Pain Management and Medication Current Pain Management: Electronic Signature(s) Signed: 04/15/2021 12:59:59 PM By: Sandre Kitty Signed: 04/15/2021 5:46:45 PM By: Deon Pilling RN, BSN Entered By: Sandre Kitty on 04/15/2021 10:49:26 -------------------------------------------------------------------------------- Patient/Caregiver Education Details Patient Name: Date of Service: [name], Kevin W. 9/27/2022andnbsp10:15 [place] Record Number:  [id number] Patient Account Number: [id number] Date of Birth/Gender: Treating RN: [birth year] (65 y.o. [name] Primary Care Physician: Carolee Rota Other Clinician: Referring Physician: Treating Physician/Extender: Maryagnes Amos in Treatment: 5 Education Assessment Education Provided To: Patient Education Topics Provided Elevated Blood Sugar/ Impact on Healing: Handouts: Elevated Blood Sugars: How Do They Affect Wound Healing Methods: Explain/Verbal, Printed Responses: Reinforcements needed, State content correctly Infection: Methods: Explain/Verbal Responses: Reinforcements needed, State content correctly Wound/Skin Impairment: Methods: Explain/Verbal Responses: Reinforcements needed, State content correctly Electronic Signature(s) Signed: 04/16/2021 5:46:28 PM By: Baruch Gouty RN, BSN Entered By: Baruch Gouty on 04/15/2021 11:16:02 -------------------------------------------------------------------------------- Wound Assessment Details Patient Name: Date of Service: [name]. 04/15/2021 10:15 A M Medical Record Number: [id number] Patient Account Number: [id number] Date of Birth/Sex: Treating RN: [birth year] (64 y.o. [name] Primary Care Tyreese Thain: Carolee Rota Other Clinician: Referring Artisha Capri: Treating Leoni Goodness/Extender: Maryagnes Amos in Treatment: 5 Wound Status Wound Number: 5 Primary Open Surgical Wound Etiology: Wound Location: Left  Amputation Site - Toe Wound Open Wounding Event: Surgical Injury Status: Date Acquired: 12/27/2020 Comorbid Chronic Obstructive Pulmonary Disease (COPD), Sleep Apnea, Weeks Of Treatment: 5 History: Coronary Artery Disease, Hypertension, Type II Diabetes Clustered Wound: No Photos Wound Measurements Length: (cm) 0.2 Width: (cm) 0.2 Depth: (cm) 0.7 Area: (cm) 0.031 Volume: (cm) 0.022 % Reduction in Area: 95.6% % Reduction in Volume:  97.6% Epithelialization: Small (1-33%) Tunneling: No Undermining: Yes Starting Position (o'clock): 12 Ending Position (o'clock): 9 Maximum Distance: (cm) 0.3 Wound Description Classification: Full Thickness With Exposed Support Structures Wound Margin: Well defined, not attached Exudate Amount: Small Exudate Type: Serosanguineous Exudate Color: red, brown Foul Odor After Cleansing: No Slough/Fibrino Yes Wound Bed Granulation Amount: Large (67-100%) Exposed Structure Granulation Quality: Red, Pink Fascia Exposed: No Necrotic Amount: Small (1-33%) Fat Layer (Subcutaneous Tissue) Exposed: Yes Necrotic Quality: Adherent Slough Tendon Exposed: Yes Muscle Exposed: No Joint Exposed: No Bone Exposed: No Electronic Signature(s) Signed: 04/15/2021 5:46:45 PM By: Deon Pilling RN, BSN Signed: 04/16/2021 5:46:28 PM By: Baruch Gouty RN, BSN Entered By: Baruch Gouty on 04/15/2021 11:30:05 -------------------------------------------------------------------------------- Wound Assessment Details Patient Name: Date of Service: [name]. 04/15/2021 10:15 A M Medical Record Number: [id number] Patient Account Number: [id number] Date of Birth/Sex: Treating RN: [birth year] (65 y.o. [name], [name] Primary Care Ivana Nicastro: Carolee Rota Other Clinician: Referring Lillyrose Reitan: Treating Damari Hiltz/Extender: Maryagnes Amos in Treatment: 5 Wound Status Wound Number: 6 Primary Diabetic Wound/Ulcer of the Lower Extremity Etiology: Wound Location: Left T Second oe Wound Open Wounding Event: Gradually Appeared Status: Date Acquired: 04/15/2021 Comorbid Chronic Obstructive Pulmonary Disease (COPD), Sleep Apnea, Weeks Of Treatment: 0 History: Coronary Artery Disease, Hypertension, Type II Diabetes Clustered Wound: No Photos Wound Measurements Length: (cm) 0.3 Width: (cm) 0.4 Depth: (cm) 0.2 Area: (cm) 0.094 Volume: (cm) 0.019 % Reduction in Area: 0% %  Reduction in Volume: 0% Epithelialization: None Tunneling: No Undermining: No Wound Description Classification: Grade 1 Wound Margin: Thickened Exudate Amount: Small Exudate Type: Serosanguineous Exudate Color: red, brown Foul Odor After Cleansing: No Slough/Fibrino Yes Wound Bed Granulation Amount: Medium (34-66%) Exposed Structure Granulation Quality: Pink Fascia Exposed: No Necrotic Amount: Medium (34-66%) Fat Layer (Subcutaneous Tissue) Exposed: Yes Necrotic Quality: Adherent Slough Tendon Exposed: No Muscle Exposed: No Joint Exposed: No Bone Exposed: No Electronic Signature(s) Signed: 04/15/2021 5:46:45 PM By: Deon Pilling RN, BSN Signed: 04/16/2021 5:46:28 PM By: Baruch Gouty RN, BSN Entered By: Baruch Gouty on 04/15/2021 11:14:28 -------------------------------------------------------------------------------- Vitals Details Patient Name: Date of Service: [name]. 04/15/2021 10:15 A M Medical Record Number: [id number] Patient Account Number: [id number] Date of Birth/Sex: Treating RN: 03/03/1956 (65 y.o. [name], [name] Primary Care Brent Taillon: Carolee Rota Other Clinician: Referring Jersee Winiarski: Treating Moises Terpstra/Extender: Maryagnes Amos in Treatment: 5 Vital Signs Time Taken: 10:48 Temperature (F): 97.9 Pulse (bpm): 99 Respiratory Rate (breaths/min): 16 Blood Pressure (mmHg): 136/72 Capillary Blood Glucose (mg/dl): 568 Reference Range: 80 - 120 mg / dl Notes glucose per pt report this am, pt reports blood sugar has not been below 300 recently Electronic Signature(s) Signed: 04/16/2021 5:46:28 PM By: Baruch Gouty RN, BSN Entered By: Baruch Gouty on 04/15/2021 11:09:38

## 2021-04-18 ENCOUNTER — Ambulatory Visit: Payer: Medicare Other | Admitting: Infectious Disease

## 2021-04-18 DIAGNOSIS — S91109A Unspecified open wound of unspecified toe(s) without damage to nail, initial encounter: Secondary | ICD-10-CM | POA: Diagnosis not present

## 2021-04-21 ENCOUNTER — Encounter (HOSPITAL_BASED_OUTPATIENT_CLINIC_OR_DEPARTMENT_OTHER): Payer: Medicare Other | Attending: Internal Medicine | Admitting: Internal Medicine

## 2021-04-21 ENCOUNTER — Other Ambulatory Visit: Payer: Self-pay

## 2021-04-21 DIAGNOSIS — Z89412 Acquired absence of left great toe: Secondary | ICD-10-CM | POA: Insufficient documentation

## 2021-04-21 DIAGNOSIS — I5032 Chronic diastolic (congestive) heart failure: Secondary | ICD-10-CM | POA: Diagnosis not present

## 2021-04-21 DIAGNOSIS — L97529 Non-pressure chronic ulcer of other part of left foot with unspecified severity: Secondary | ICD-10-CM | POA: Insufficient documentation

## 2021-04-21 DIAGNOSIS — T8189XA Other complications of procedures, not elsewhere classified, initial encounter: Secondary | ICD-10-CM | POA: Diagnosis not present

## 2021-04-21 DIAGNOSIS — Z794 Long term (current) use of insulin: Secondary | ICD-10-CM | POA: Diagnosis not present

## 2021-04-21 DIAGNOSIS — S91302D Unspecified open wound, left foot, subsequent encounter: Secondary | ICD-10-CM

## 2021-04-21 DIAGNOSIS — N183 Chronic kidney disease, stage 3 unspecified: Secondary | ICD-10-CM | POA: Insufficient documentation

## 2021-04-21 DIAGNOSIS — E1122 Type 2 diabetes mellitus with diabetic chronic kidney disease: Secondary | ICD-10-CM | POA: Diagnosis not present

## 2021-04-21 DIAGNOSIS — I130 Hypertensive heart and chronic kidney disease with heart failure and stage 1 through stage 4 chronic kidney disease, or unspecified chronic kidney disease: Secondary | ICD-10-CM | POA: Diagnosis not present

## 2021-04-21 DIAGNOSIS — F17210 Nicotine dependence, cigarettes, uncomplicated: Secondary | ICD-10-CM | POA: Diagnosis not present

## 2021-04-21 DIAGNOSIS — J449 Chronic obstructive pulmonary disease, unspecified: Secondary | ICD-10-CM | POA: Insufficient documentation

## 2021-04-21 DIAGNOSIS — Y835 Amputation of limb(s) as the cause of abnormal reaction of the patient, or of later complication, without mention of misadventure at the time of the procedure: Secondary | ICD-10-CM | POA: Insufficient documentation

## 2021-04-21 DIAGNOSIS — Z9981 Dependence on supplemental oxygen: Secondary | ICD-10-CM | POA: Insufficient documentation

## 2021-04-21 DIAGNOSIS — M86672 Other chronic osteomyelitis, left ankle and foot: Secondary | ICD-10-CM | POA: Insufficient documentation

## 2021-04-21 DIAGNOSIS — E11621 Type 2 diabetes mellitus with foot ulcer: Secondary | ICD-10-CM | POA: Insufficient documentation

## 2021-04-21 NOTE — Progress Notes (Addendum)
[name] ([id number]) , Visit Report for 04/21/2021 Arrival Information Details Patient Name: Date of Service: [name], [name] 04/21/2021 9:45 A M Medical Record Number: [id number] Patient Account Number: [id number] Date of Birth/Sex: Treating RN: 23-Aug-1955 (65 y.o. [name], [name] Primary Care [place] Kielbasa: Carolee Rota Other Clinician: Referring Kordell Jafri: Treating Micayla Brathwaite/Extender: Maryagnes Amos in Treatment: 6 Visit Information History Since Last Visit Added or deleted any medications: No Patient Arrived: Ambulatory Any new allergies or adverse reactions: No Arrival Time: 09:54 Had a fall or experienced change in No Accompanied By: self activities of daily living that may affect Transfer Assistance: None risk of falls: Patient Identification Verified: Yes Signs or symptoms of abuse/neglect since No Secondary Verification Process Completed: Yes last visito Patient Requires Transmission-Based Precautions: No Hospitalized since last visit: No Patient Has Alerts: Yes Implantable device outside of the clinic No Patient Alerts: ABI's: R: 0.82 L:0.76 excluding cellular tissue based products placed in the center since last visit: Has Dressing in Place as Prescribed: Yes Has Footwear/Offloading in Place as Yes Prescribed: Left: Surgical Shoe with Pressure Relief Insole Pain Present Now: No Electronic Signature(s) Signed: 04/21/2021 12:34:44 PM By: Deon Pilling RN, BSN Entered By: Deon Pilling on 04/21/2021 09:58:25 -------------------------------------------------------------------------------- Clinic Level of Care Assessment Details Patient Name: Date of Service: [name] 04/21/2021 9:45 A M Medical Record Number: [id number] Patient Account Number: [id number] Date of Birth/Sex: Treating RN: [birth year] (65 y.o. [name], [name] Primary Care Majestic Brister: Carolee Rota Other Clinician: Referring Tarra Pence: Treating Aysa Larivee/Extender:  Maryagnes Amos in Treatment: 6 Clinic Level of Care Assessment Items TOOL 4 Quantity Score X- 1 0 Use when only an EandM is performed on FOLLOW-UP visit ASSESSMENTS - Nursing Assessment / Reassessment X- 1 10 Reassessment of Co-morbidities (includes updates in patient status) X- 1 5 Reassessment of Adherence to Treatment Plan ASSESSMENTS - Wound and Skin A ssessment / Reassessment []  - 0 Simple Wound Assessment / Reassessment - one wound X- 2 5 Complex Wound Assessment / Reassessment - multiple wounds X- 1 10 Dermatologic / Skin Assessment (not related to wound area) ASSESSMENTS - Focused Assessment X- 1 5 Circumferential Edema Measurements - multi extremities X- 1 10 Nutritional Assessment / Counseling / Intervention []  - 0 Lower Extremity Assessment (monofilament, tuning fork, pulses) []  - 0 Peripheral Arterial Disease Assessment (using hand held doppler) ASSESSMENTS - Ostomy and/or Continence Assessment and Care []  - 0 Incontinence Assessment and Management []  - 0 Ostomy Care Assessment and Management (repouching, etc.) PROCESS - Coordination of Care []  - 0 Simple Patient / Family Education for ongoing care X- 1 20 Complex (extensive) Patient / Family Education for ongoing care X- 1 10 Staff obtains Programmer, systems, Records, T Results / Process Orders est []  - 0 Staff telephones HHA, Nursing Homes / Clarify orders / etc []  - 0 Routine Transfer to another Facility (non-emergent condition) []  - 0 Routine Hospital Admission (non-emergent condition) []  - 0 New Admissions / Biomedical engineer / Ordering NPWT Apligraf, etc. , []  - 0 Emergency Hospital Admission (emergent condition) []  - 0 Simple Discharge Coordination X- 1 15 Complex (extensive) Discharge Coordination PROCESS - Special Needs []  - 0 Pediatric / Minor Patient Management []  - 0 Isolation Patient Management []  - 0 Hearing / Language / Visual special needs []  -  0 Assessment of Community assistance (transportation, D/C planning, etc.) []  - 0 Additional assistance / Altered mentation []  - 0 Support Surface(s) Assessment (bed, cushion, seat, etc.) INTERVENTIONS - Wound Cleansing / Measurement []  -  0 Simple Wound Cleansing - one wound X- 2 5 Complex Wound Cleansing - multiple wounds X- 1 5 Wound Imaging (photographs - any number of wounds) $RemoveBe'[]'NvTqxuQPk$  - 0 Wound Tracing (instead of photographs) $RemoveBeforeD'[]'dDRUGJPCDJMZdC$  - 0 Simple Wound Measurement - one wound X- 2 5 Complex Wound Measurement - multiple wounds INTERVENTIONS - Wound Dressings X - Small Wound Dressing one or multiple wounds 2 10 $Re'[]'OCI$  - 0 Medium Wound Dressing one or multiple wounds $RemoveBeforeD'[]'hzDSxKkNrqwDFA$  - 0 Large Wound Dressing one or multiple wounds $RemoveBeforeD'[]'JSDDgdUiAkTUes$  - 0 Application of Medications - topical $RemoveB'[]'cvNewIfu$  - 0 Application of Medications - injection INTERVENTIONS - Miscellaneous $RemoveBeforeD'[]'HPzCDQwDuzYRNT$  - 0 External ear exam $Remove'[]'XOtvwZV$  - 0 Specimen Collection (cultures, biopsies, blood, body fluids, etc.) $RemoveBefor'[]'eQwCChHJyoQO$  - 0 Specimen(s) / Culture(s) sent or taken to Lab for analysis $RemoveBefo'[]'DJsmcDUAAUO$  - 0 Patient Transfer (multiple staff / Civil Service fast streamer / Similar devices) $RemoveBeforeDE'[]'KGOLXLONqUkhlnc$  - 0 Simple Staple / Suture removal (25 or less) $Remove'[]'qlrlsit$  - 0 Complex Staple / Suture removal (26 or more) $Remove'[]'bDiPJuN$  - 0 Hypo / Hyperglycemic Management (close monitor of Blood Glucose) $RemoveBefore'[]'jUhccxSDuWznO$  - 0 Ankle / Brachial Index (ABI) - do not check if billed separately X- 1 5 Vital Signs Has the patient been seen at the hospital within the last three years: Yes Total Score: 145 Level Of Care: New/Established - Level 4 Electronic Signature(s) Signed: 04/21/2021 12:34:44 PM By: Deon Pilling RN, BSN Entered By: Deon Pilling on 04/21/2021 10:29:03 -------------------------------------------------------------------------------- Encounter Discharge Information Details Patient Name: Date of Service: [name]. 04/21/2021 9:45 A M Medical Record Number: [id number] Patient Account Number: [id number] Date of Birth/Sex: Treating  RN: [birth year] (65 y.o. [name] Primary Care Zhaniya Swallows: Carolee Rota Other Clinician: Referring Jocabed Cheese: Treating Philopateer Strine/Extender: Maryagnes Amos in Treatment: 6 Encounter Discharge Information Items Discharge Condition: Stable Ambulatory Status: Ambulatory Discharge Destination: Home Transportation: Private Auto Accompanied By: self Schedule Follow-up Appointment: Yes Clinical Summary of Care: Electronic Signature(s) Signed: 04/21/2021 12:34:44 PM By: Deon Pilling RN, BSN Entered By: Deon Pilling on 04/21/2021 10:30:05 -------------------------------------------------------------------------------- Lower Extremity Assessment Details Patient Name: Date of Service: [name]. 04/21/2021 9:45 A M Medical Record Number: [id number] Patient Account Number: [id number] Date of Birth/Sex: Treating RN: 1956-06-04 (65 y.o. [name] Primary Care Rishon Thilges: Carolee Rota Other Clinician: Referring Seng Larch: Treating Naomy Esham/Extender: Maryagnes Amos in Treatment: 6 Edema Assessment Assessed: [name]: No] [Right: No] Edema: [Left: Ye] [Right: s] Calf Left: Right: Point of Measurement: 37 cm From Medial Instep 36.5 cm Ankle Left: Right: Point of Measurement: 8 cm From Medial Instep 23 cm Vascular Assessment Pulses: Dorsalis Pedis Palpable: [Left:Yes] Electronic Signature(s) Signed: 04/21/2021 12:34:44 PM By: Deon Pilling RN, BSN Entered By: Deon Pilling on 04/21/2021 10:02:17 -------------------------------------------------------------------------------- Multi Wound Chart Details Patient Name: Date of Service: [name]. 04/21/2021 9:45 A M Medical Record Number: [id number] Patient Account Number: [id number] Date of Birth/Sex: Treating RN: [birth year] (66 y.o. [name] Primary Care Antonia Culbertson: Carolee Rota Other Clinician: Referring Gizella [place]: Treating Keith Cancio/Extender: Maryagnes Amos in Treatment: 6 Vital Signs Height(in): Capillary Blood Glucose(mg/dl): 600 Weight(lbs): Pulse(bpm): 112 Body Mass Index(BMI): Blood Pressure(mmHg): 136/74 Temperature(F): 97.7 Respiratory Rate(breaths/min): 20 Photos: [N/A:N/A] Left Amputation Site - Toe Left T Second oe N/A Wound Location: Surgical Injury Gradually Appeared N/A Wounding Event: Open Surgical Wound Diabetic Wound/Ulcer of the Lower N/A Primary Etiology: Extremity Chronic Obstructive Pulmonary Chronic Obstructive Pulmonary N/A Comorbid History: Disease (COPD), Sleep Apnea, Disease (COPD), Sleep Apnea, Coronary Artery Disease, Coronary Artery Disease, Hypertension, Type  II Diabetes Hypertension, Type II Diabetes 12/27/2020 04/15/2021 N/A Date Acquired: 6 0 N/A Weeks of Treatment: Open Open N/A Wound Status: 0.1x0.1x0.5 0.1x0.3x0.3 N/A Measurements L x W x D (cm) 0.008 0.024 N/A A (cm) : rea 0.004 0.007 N/A Volume (cm) : 98.90% 74.50% N/A % Reduction in A rea: 99.60% 63.20% N/A % Reduction in Volume: 9 12 Starting Position 1 (o'clock): 12 12 Ending Position 1 (o'clock): 0.7 0.2 Maximum Distance 1 (cm): Yes Yes N/A Undermining: Full Thickness With Exposed Support Grade 1 N/A Classification: Structures Small Small N/A Exudate Amount: Serosanguineous Serosanguineous N/A Exudate Type: red, brown red, brown N/A Exudate Color: Well defined, not attached Thickened N/A Wound Margin: Large (67-100%) Large (67-100%) N/A Granulation Amount: Red, Pink Pink N/A Granulation Quality: None Present (0%) None Present (0%) N/A Necrotic Amount: Fat Layer (Subcutaneous Tissue): Yes Fat Layer (Subcutaneous Tissue): Yes N/A Exposed Structures: Fascia: No Fascia: No Tendon: No Tendon: No Muscle: No Muscle: No Joint: No Joint: No Bone: No Bone: No Small (1-33%) None N/A Epithelialization: Treatment Notes Wound #5 (Amputation Site - Toe) Wound Laterality:  Left Cleanser Normal Saline Discharge Instruction: Cleanse the wound with Normal Saline prior to applying a clean dressing using gauze sponges, not tissue or cotton balls. Soap and Water Discharge Instruction: May shower and wash wound with dial antibacterial soap and water prior to dressing change. Peri-Wound Care Topical Primary Dressing KerraCel Ag Gelling Fiber Dressing, 4x5 in (silver alginate) Discharge Instruction: Apply silver alginate to wound bed as instructed Secondary Dressing Woven Gauze Sponge, Non-Sterile 4x4 in Discharge Instruction: Apply over primary dressing as directed. Secured With [employer], 4.5x3.1 (in/yd) Discharge Instruction: Secure with Kerlix as directed. 41M Medipore H Soft Cloth Surgical T ape, 2x2 (in/yd) Discharge Instruction: Secure dressing with tape as directed. Compression Wrap Compression Stockings Add-Ons Wound #6 (Toe Second) Wound Laterality: Left Cleanser Normal Saline Discharge Instruction: Cleanse the wound with Normal Saline prior to applying a clean dressing using gauze sponges, not tissue or cotton balls. Peri-Wound Care Topical Primary Dressing KerraCel Ag Gelling Fiber Dressing, 2x2 in (silver alginate) Discharge Instruction: Apply silver alginate to wound bed as instructed Secondary Dressing Woven Gauze Sponges 2x2 in Discharge Instruction: Apply over primary dressing as directed. Optifoam Non-Adhesive Dressing, 4x4 in Discharge Instruction: Apply over primary dressing to cushion tip of toe Secured With Conforming Stretch Gauze Bandage, Sterile 2x75 (in/in) Discharge Instruction: Secure with stretch gauze as directed. 41M Medipore H Soft Cloth Surgical T ape, 2x2 (in/yd) Discharge Instruction: Secure dressing with tape as directed. Compression Wrap Compression Stockings Add-Ons Electronic Signature(s) Signed: 04/22/2021 1:19:34 PM By: Kalman Shan DO Signed: 04/22/2021 5:32:19 PM By: Deon Pilling RN,  BSN Entered By: Kalman Shan on 04/22/2021 13:05:24 -------------------------------------------------------------------------------- Multi-Disciplinary Care Plan Details Patient Name: Date of Service: [name]. 04/21/2021 9:45 A M Medical Record Number: [id number] Patient Account Number: [id number] Date of Birth/Sex: Treating RN: 22-Oct-1955 (65 y.o. [name] Primary Care Abdirahim Flavell: Carolee Rota Other Clinician: Referring Neko Mcgeehan: Treating Donyea Beverlin/Extender: Maryagnes Amos in Treatment: 6 Multidisciplinary Care Plan reviewed with physician Active Inactive Wound/Skin Impairment Nursing Diagnoses: Impaired tissue integrity Knowledge deficit related to ulceration/compromised skin integrity Goals: Patient will have a decrease in wound volume by X% from date: (specify in notes) Date Initiated: 03/06/2021 Date Inactivated: 04/15/2021 Target Resolution Date: 03/26/2021 Goal Status: Met Patient/caregiver will verbalize understanding of skin care regimen Date Initiated: 03/06/2021 Target Resolution Date: 05/19/2021 Goal Status: Active Ulcer/skin breakdown will have a volume reduction of 30% by week  4 Date Initiated: 03/06/2021 Date Inactivated: 04/15/2021 Target Resolution Date: 03/26/2021 Goal Status: Unmet Unmet Reason: probable osteomyelitis Ulcer/skin breakdown will have a volume reduction of 50% by week 8 Date Initiated: 04/15/2021 Date Inactivated: 04/21/2021 Target Resolution Date: 04/22/2021 Goal Status: Met Ulcer/skin breakdown will have a volume reduction of 80% by week 12 Date Initiated: 04/21/2021 Target Resolution Date: 05/19/2021 Goal Status: Active Interventions: Assess patient/caregiver ability to obtain necessary supplies Assess patient/caregiver ability to perform ulcer/skin care regimen upon admission and as needed Assess ulceration(s) every visit Notes: Electronic Signature(s) Signed: 04/21/2021 12:34:44 PM By: Deon Pilling  RN, BSN Entered By: Deon Pilling on 04/21/2021 10:06:51 -------------------------------------------------------------------------------- Pain Assessment Details Patient Name: Date of Service: [name]. 04/21/2021 9:45 A M Medical Record Number: [id number] Patient Account Number: [id number] Date of Birth/Sex: Treating RN: 1955-08-24 (65 y.o. [name] Primary Care Chevis Weisensel: Carolee Rota Other Clinician: Referring Cleva Camero: Treating Brexley Cutshaw/Extender: Maryagnes Amos in Treatment: 6 Active Problems Location of Pain Severity and Description of Pain Patient Has Paino No Site Locations Rate the pain. Current Pain Level: 0 Pain Management and Medication Current Pain Management: Electronic Signature(s) Signed: 04/21/2021 12:34:44 PM By: Deon Pilling RN, BSN Entered By: Deon Pilling on 04/21/2021 10:02:02 -------------------------------------------------------------------------------- Patient/Caregiver Education Details Patient Name: Date of Service: [name] 10/3/2022andnbsp9:45 A M Medical Record Number: [id number] Patient Account Number: [id number] Date of Birth/Gender: Treating RN: [birth year] (65 y.o. [name] Primary Care Physician: Carolee Rota Other Clinician: Referring Physician: Treating Physician/Extender: Maryagnes Amos in Treatment: 6 Education Assessment Education Provided To: Patient Education Topics Provided Elevated Blood Sugar/ Impact on Healing: Methods: Explain/Verbal Responses: Reinforcements needed, State content correctly Wound Debridement: Methods: Explain/Verbal Responses: Reinforcements needed, State content correctly Electronic Signature(s) Signed: 04/21/2021 12:34:44 PM By: Deon Pilling RN, BSN Entered By: Deon Pilling on 04/21/2021 10:08:01 -------------------------------------------------------------------------------- Wound Assessment Details Patient  Name: Date of Service: [name]. 04/21/2021 9:45 A M Medical Record Number: [id number] Patient Account Number: [id number] Date of Birth/Sex: Treating RN: [birth year] (65 y.o. [name], [name] Primary Care Ashaki Frosch: Carolee Rota Other Clinician: Referring Averly Ericson: Treating Naasia Weilbacher/Extender: Maryagnes Amos in Treatment: 6 Wound Status Wound Number: 5 Primary Open Surgical Wound Etiology: Wound Location: Left Amputation Site - Toe Wound Open Wounding Event: Surgical Injury Status: Date Acquired: 12/27/2020 Comorbid Chronic Obstructive Pulmonary Disease (COPD), Sleep Apnea, Weeks Of Treatment: 6 History: Coronary Artery Disease, Hypertension, Type II Diabetes Clustered Wound: No Photos Wound Measurements Length: (cm) 0.1 Width: (cm) 0.1 Depth: (cm) 0.5 Area: (cm) 0.008 Volume: (cm) 0.004 % Reduction in Area: 98.9% % Reduction in Volume: 99.6% Epithelialization: Small (1-33%) Tunneling: No Undermining: Yes Starting Position (o'clock): 9 Ending Position (o'clock): 12 Maximum Distance: (cm) 0.7 Wound Description Classification: Full Thickness With Exposed Support Structures Wound Margin: Well defined, not attached Exudate Amount: Small Exudate Type: Serosanguineous Exudate Color: red, brown Foul Odor After Cleansing: No Slough/Fibrino Yes Wound Bed Granulation Amount: Large (67-100%) Exposed Structure Granulation Quality: Red, Pink Fascia Exposed: No Necrotic Amount: None Present (0%) Fat Layer (Subcutaneous Tissue) Exposed: Yes Tendon Exposed: No Muscle Exposed: No Joint Exposed: No Bone Exposed: No Treatment Notes Wound #5 (Amputation Site - Toe) Wound Laterality: Left Cleanser Normal Saline Discharge Instruction: Cleanse the wound with Normal Saline prior to applying a clean dressing using gauze sponges, not tissue or cotton balls. Soap and Water Discharge Instruction: May shower and wash wound with dial antibacterial soap  and water prior to dressing change. Peri-Wound Care Topical Primary Dressing  KerraCel Ag Gelling Fiber Dressing, 4x5 in (silver alginate) Discharge Instruction: Apply silver alginate to wound bed as instructed Secondary Dressing Woven Gauze Sponge, Non-Sterile 4x4 in Discharge Instruction: Apply over primary dressing as directed. Secured With [employer], 4.5x3.1 (in/yd) Discharge Instruction: Secure with Kerlix as directed. 42M Medipore H Soft Cloth Surgical T ape, 2x2 (in/yd) Discharge Instruction: Secure dressing with tape as directed. Compression Wrap Compression Stockings Add-Ons Electronic Signature(s) Signed: 04/21/2021 12:34:44 PM By: Shawn Stall RN, BSN Entered By: Shawn Stall on 04/21/2021 10:05:21 -------------------------------------------------------------------------------- Wound Assessment Details Patient Name: Date of Service: [name]. 04/21/2021 9:45 A M Medical Record Number: [id number] Patient Account Number: [id number] Date of Birth/Sex: Treating RN: 20-Dec-1955 (66 y.o. Harlon Flor, Millard.Loa Primary Care Leydy Worthey: Mitzi Hansen Other Clinician: Referring Raymar Joiner: Treating Anija Brickner/Extender: Lavera Guise in Treatment: 6 Wound Status Wound Number: 6 Primary Diabetic Wound/Ulcer of the Lower Extremity Etiology: Wound Location: Left T Second oe Wound Open Wounding Event: Gradually Appeared Status: Date Acquired: 04/15/2021 Comorbid Chronic Obstructive Pulmonary Disease (COPD), Sleep Apnea, Weeks Of Treatment: 0 History: Coronary Artery Disease, Hypertension, Type II Diabetes Clustered Wound: No Photos Wound Measurements Length: (cm) 0.1 Width: (cm) 0.3 Depth: (cm) 0.3 Area: (cm) 0.024 Volume: (cm) 0.007 % Reduction in Area: 74.5% % Reduction in Volume: 63.2% Epithelialization: None Tunneling: No Undermining: Yes Starting Position (o'clock): 12 Ending Position (o'clock): 12 Maximum Distance: (cm)  0.2 Wound Description Classification: Grade 1 Wound Margin: Thickened Exudate Amount: Small Exudate Type: Serosanguineous Exudate Color: red, brown Foul Odor After Cleansing: No Slough/Fibrino No Wound Bed Granulation Amount: Large (67-100%) Exposed Structure Granulation Quality: Pink Fascia Exposed: No Necrotic Amount: None Present (0%) Fat Layer (Subcutaneous Tissue) Exposed: Yes Tendon Exposed: No Muscle Exposed: No Joint Exposed: No Bone Exposed: No Treatment Notes Wound #6 (Toe Second) Wound Laterality: Left Cleanser Normal Saline Discharge Instruction: Cleanse the wound with Normal Saline prior to applying a clean dressing using gauze sponges, not tissue or cotton balls. Peri-Wound Care Topical Primary Dressing KerraCel Ag Gelling Fiber Dressing, 2x2 in (silver alginate) Discharge Instruction: Apply silver alginate to wound bed as instructed Secondary Dressing Woven Gauze Sponges 2x2 in Discharge Instruction: Apply over primary dressing as directed. Optifoam Non-Adhesive Dressing, 4x4 in Discharge Instruction: Apply over primary dressing to cushion tip of toe Secured With Conforming Stretch Gauze Bandage, Sterile 2x75 (in/in) Discharge Instruction: Secure with stretch gauze as directed. 42M Medipore H Soft Cloth Surgical T ape, 2x2 (in/yd) Discharge Instruction: Secure dressing with tape as directed. Compression Wrap Compression Stockings Add-Ons Electronic Signature(s) Signed: 04/21/2021 12:34:44 PM By: Shawn Stall RN, BSN Entered By: Shawn Stall on 04/21/2021 10:05:50 -------------------------------------------------------------------------------- Vitals Details Patient Name: Date of Service: [name]. 04/21/2021 9:45 A M Medical Record Number: [id number] Patient Account Number: [id number] Date of Birth/Sex: Treating RN: [birth year] (65 y.o. Harlon Flor, Millard.Loa Primary Care Tracker Mance: Mitzi Hansen Other Clinician: Referring Treydon Henricks: Treating  Infant Zink/Extender: Lavera Guise in Treatment: 6 Vital Signs Time Taken: 09:58 Temperature (F): 97.7 Pulse (bpm): 112 Respiratory Rate (breaths/min): 20 Blood Pressure (mmHg): 136/74 Capillary Blood Glucose (mg/dl): 617 Reference Range: 80 - 120 mg / dl Notes Per patient takes insulin and 3 oral medications. Per patient PCP manages has referred to an endocrinologist. Per patient waiting for a call for an appt. Advised patient to call PCP with a follow up on that appt. patient in agreement. Electronic Signature(s) Signed: 04/21/2021 12:34:44 PM By: Shawn Stall RN, BSN Entered By: Shawn Stall on 04/21/2021 09:59:45

## 2021-04-22 NOTE — Progress Notes (Signed)
RUI WORDELL ([id number]) , Visit Report for 04/21/2021 Chief Complaint Document Details Patient Name: Date of Service: AJENE, CARCHI 04/21/2021 9:45 A M Medical Record Number: [id number] Patient Account Number: [id number] Date of Birth/Sex: Treating RN: 1956/04/23 (65 y.o. [name] Primary Care Provider: Carolee Rota Other Clinician: Referring Provider: Treating Provider/Extender: Maryagnes Amos in Treatment: 6 Information Obtained from: Patient Chief Complaint non healing to the left great toe surgical wound site s/p amputation Electronic Signature(s) Signed: 04/22/2021 1:19:34 PM By: Kalman Shan DO Entered By: Kalman Shan on 04/22/2021 13:05:32 -------------------------------------------------------------------------------- HPI Details Patient Name: Date of Service: [name]. 04/21/2021 9:45 A M Medical Record Number: [id number] Patient Account Number: [id number] Date of Birth/Sex: Treating RN: 04/21/1956 (65 y.o. [name] Primary Care Provider: Carolee Rota Other Clinician: Referring Provider: Treating Provider/Extender: Maryagnes Amos in Treatment: 6 History of Present Illness HPI Description: Admission 8/18 Mr. Yi Haugan is a 65 year old male with a past medical history of uncontrolled insulin-dependent type 2 diabetes, COPD On O2 6 L via nasal cannula That presents to the clinic for a 45-month history of nonhealing wound to the left great toe amputation site. He has been following with podiatry for a wound to his left great toe that developed osteomyelitis and subsequently required amputation. He had the surgery done on 6/10. He had a partial first ray amputation by Dr. Posey Pronto. Since his surgery he has been placing iodine on the wound bed. He states that there has been no improvement to the wound healing in the past 2 months. He states he has occasional intermittent pain to the area. He  denies systemic signs of infection. 8/25; patient presents for 1 week follow-up. He states he has been using silver alginate daily without issues. He has no complaints today. He denies signs of infection. He is scheduled to have his MRI on 8/27. 9/15; patient presents for follow-up. He missed his last clinic appointment due to feeling ill. He continues to use silver alginate daily without any issues. He reports improvement in wound healing. He currently denies signs of infection. 9/27; patient presents for follow-up. He has developed a new wound to the second left toe. He is unaware of this and was noted on exam today. He continues to use silver alginate to the amputation site wound. He has no issues or complaints today. He currently denies signs of infection. 10/4; patient presents for follow-up. He is using silver alginate to the wound sites. He has no issues or complaints today. He denies systemic signs of infection. Electronic Signature(s) Signed: 04/22/2021 1:19:34 PM By: Kalman Shan DO Entered By: Kalman Shan on 04/22/2021 13:08:11 -------------------------------------------------------------------------------- Physical Exam Details Patient Name: Date of Service: MAGUIRE, SIME 04/21/2021 9:45 A M Medical Record Number: [id number] Patient Account Number: [id number] Date of Birth/Sex: Treating RN: 1956-04-04 (65 y.o. [name] Primary Care Provider: Carolee Rota Other Clinician: Referring Provider: Treating Provider/Extender: Maryagnes Amos in Treatment: 6 Constitutional respirations regular, non-labored and within target range for patient.[name] Psychiatric pleasant and cooperative. Notes Left foot: T the left great toe amputation site there is a small open wound with granulation tissue present and depth that overlies bone. No obvious signs of o infection on exam. Distal portion of the second toe has an open wound with callus and granulation  tissue present. No signs of infection. Electronic Signature(s) Signed: 04/22/2021 1:19:34 PM By: Kalman Shan DO Entered By: Kalman Shan on 04/22/2021 13:09:33 -------------------------------------------------------------------------------- Physician Orders Details  Patient Name: Date of Service: CLEBURN, MAIOLO 04/21/2021 9:45 A M Medical Record Number: [id number] Patient Account Number: [id number] Date of Birth/Sex: Treating RN: 1955/11/02 (64 y.o. [name] Primary Care Provider: Carolee Rota Other Clinician: Referring Provider: Treating Provider/Extender: Maryagnes Amos in Treatment: 6 Verbal / Phone Orders: No Diagnosis Coding ICD-10 Coding Code Description S91.302D Unspecified open wound, left foot, subsequent encounter E11.621 Type 2 diabetes mellitus with foot ulcer J44.9 Chronic obstructive pulmonary disease, unspecified Follow-up Appointments ppointment in 1 week. - Dr. Heber  on Tuesday Return A ***Ensure to follow up with primary care provider concerning elevated blood glucose.*** ***Infectious Disease appt 04/24/2021 ensure to go to appt this week.*** Bathing/ Shower/ Hygiene May shower and wash wound with soap and water. - with dressing changes only. Edema Control - Lymphedema / SCD / Other Elevate legs to the level of the heart or above for 30 minutes daily and/or when sitting, a frequency of: - throughout the day Avoid standing for long periods of time. Moisturize legs daily. - both legs and feet every night before bed. Off-Loading Open toe surgical shoe to: - left foot Wound Treatment Wound #5 - Amputation Site - Toe Wound Laterality: Left Cleanser: Normal Saline (DME) (Generic) 1 x Per Day/30 Days Discharge Instructions: Cleanse the wound with Normal Saline prior to applying a clean dressing using gauze sponges, not tissue or cotton balls. Cleanser: Soap and Water 1 x Per Day/30 Days Discharge Instructions: May shower  and wash wound with dial antibacterial soap and water prior to dressing change. Prim Dressing: KerraCel Ag Gelling Fiber Dressing, 4x5 in (silver alginate) (Generic) 1 x Per Day/30 Days ary Discharge Instructions: Apply silver alginate to wound bed as instructed Secondary Dressing: Woven Gauze Sponge, Non-Sterile 4x4 in (Generic) 1 x Per Day/30 Days Discharge Instructions: Apply over primary dressing as directed. Secured With: [employer], 4.5x3.1 (in/yd) (Generic) 1 x Per Day/30 Days Discharge Instructions: Secure with Kerlix as directed. Secured With: 40M Medipore H Soft Cloth Surgical Tape, 2x2 (in/yd) (DME) (Generic) 1 x Per Day/30 Days Discharge Instructions: Secure dressing with tape as directed. Wound #6 - T Second oe Wound Laterality: Left Cleanser: Normal Saline (DME) (Generic) 1 x Per Day/30 Days Discharge Instructions: Cleanse the wound with Normal Saline prior to applying a clean dressing using gauze sponges, not tissue or cotton balls. Prim Dressing: KerraCel Ag Gelling Fiber Dressing, 2x2 in (silver alginate) (Generic) 1 x Per Day/30 Days ary Discharge Instructions: Apply silver alginate to wound bed as instructed Secondary Dressing: Woven Gauze Sponges 2x2 in (Generic) 1 x Per Day/30 Days Discharge Instructions: Apply over primary dressing as directed. Secondary Dressing: Optifoam Non-Adhesive Dressing, 4x4 in (Generic) 1 x Per Day/30 Days Discharge Instructions: Apply over primary dressing to cushion tip of toe Secured With: Conforming Stretch Gauze Bandage, Sterile 2x75 (in/in) (Generic) 1 x Per Day/30 Days Discharge Instructions: Secure with stretch gauze as directed. Secured With: 40M Medipore H Soft Cloth Surgical Tape, 2x2 (in/yd) (DME) (Generic) 1 x Per Day/30 Days Discharge Instructions: Secure dressing with tape as directed. Electronic Signature(s) Signed: 04/22/2021 1:19:34 PM By: Kalman Shan DO Previous Signature: 04/21/2021 12:34:44 PM Version By:  Deon Pilling RN, BSN Entered By: Kalman Shan on 04/22/2021 13:15:58 -------------------------------------------------------------------------------- Problem List Details Patient Name: Date of Service: [name]. 04/21/2021 9:45 A M Medical Record Number: [id number] Patient Account Number: [id number] Date of Birth/Sex: Treating RN: 1956-04-27 (65 y.o. [name] Primary Care Provider: Carolee Rota Other Clinician: Referring Provider: Treating  Provider/Extender: Maryagnes Amos in Treatment: 6 Active Problems ICD-10 Encounter Code Description Active Date MDM Diagnosis S91.302D Unspecified open wound, left foot, subsequent encounter 03/13/2021 No Yes E11.621 Type 2 diabetes mellitus with foot ulcer 03/06/2021 No Yes J44.9 Chronic obstructive pulmonary disease, unspecified 03/06/2021 No Yes Inactive Problems ICD-10 Code Description Active Date Inactive Date S91.302A Unspecified open wound, left foot, initial encounter 03/06/2021 03/06/2021 Resolved Problems Electronic Signature(s) Signed: 04/22/2021 1:19:34 PM By: Kalman Shan DO Previous Signature: 04/21/2021 12:34:44 PM Version By: Deon Pilling RN, BSN Entered By: Kalman Shan on 04/22/2021 13:05:10 -------------------------------------------------------------------------------- Progress Note Details Patient Name: Date of Service: [name]. 04/21/2021 9:45 A M Medical Record Number: [id number] Patient Account Number: [id number] Date of Birth/Sex: Treating RN: 14-Mar-1956 (65 y.o. [name], [name] Primary Care Provider: Carolee Rota Other Clinician: Referring Provider: Treating Provider/Extender: Maryagnes Amos in Treatment: 6 Subjective Chief Complaint Information obtained from Patient non healing to the left great toe surgical wound site s/p amputation History of Present Illness (HPI) Admission 8/18 Mr. Jeancarlo Leffler is a 65 year old male with a  past medical history of uncontrolled insulin-dependent type 2 diabetes, COPD On O2 6 L via nasal cannula That presents to the clinic for a 98-month history of nonhealing wound to the left great toe amputation site. He has been following with podiatry for a wound to his left great toe that developed osteomyelitis and subsequently required amputation. He had the surgery done on 6/10. He had a partial first ray amputation by Dr. Posey Pronto. Since his surgery he has been placing iodine on the wound bed. He states that there has been no improvement to the wound healing in the past 2 months. He states he has occasional intermittent pain to the area. He denies systemic signs of infection. 8/25; patient presents for 1 week follow-up. He states he has been using silver alginate daily without issues. He has no complaints today. He denies signs of infection. He is scheduled to have his MRI on 8/27. 9/15; patient presents for follow-up. He missed his last clinic appointment due to feeling ill. He continues to use silver alginate daily without any issues. He reports improvement in wound healing. He currently denies signs of infection. 9/27; patient presents for follow-up. He has developed a new wound to the second left toe. He is unaware of this and was noted on exam today. He continues to use silver alginate to the amputation site wound. He has no issues or complaints today. He currently denies signs of infection. 10/4; patient presents for follow-up. He is using silver alginate to the wound sites. He has no issues or complaints today. He denies systemic signs of infection. Patient History Information obtained from Patient. Family History Cancer - Maternal Grandparents,Paternal Grandparents,Father,Mother, Diabetes - Mother,Father, Heart Disease - Maternal Grandparents,Mother,Father, Kidney Disease - Maternal Grandparents, Lung Disease - Father, Stroke - Paternal Grandparents,Maternal Grandparents,Mother, No family  history of Hereditary Spherocytosis, Hypertension, Seizures, Thyroid Problems, Tuberculosis. Social History Current every day smoker - 1/2 pack a day, Alcohol Use - Never, Drug Use - No History, Caffeine Use - Daily - coffee, tea. Medical History Respiratory Patient has history of Chronic Obstructive Pulmonary Disease (COPD), Sleep Apnea - does not use CPAP Cardiovascular Patient has history of Coronary Artery Disease - mx vessel, Hypertension Gastrointestinal Denies history of Cirrhosis , Colitis, Crohnoos, Hepatitis A, Hepatitis B, Hepatitis C Endocrine Patient has history of Type II Diabetes - last A1c - 13.3 Hospitalization/Surgery History - appendectomy. - cholecystectomy. - laproscopic  nephrectomy. - lapro lysis of lesions. - thorascopy. - lung biopsy. - bronchoscopy. - (L) heart cath with angio X3. - endarcdectomy. - patch angioplasty. - left great toe amp. December 27, 2020. Medical A Surgical History Notes nd Constitutional Symptoms (General Health) noncompliance , cocaine abuse ,hepatitis ,headaches Respiratory h/o pneumonia , end stage chronic lung disease , COPD/ILD Cardiovascular h/o CVA , hyperlipidemia , coronary atherosclerosis , acute on chronic diastolic heart failure LVEF 60%-65% Gastrointestinal cholecystitis , GERD Genitourinary h/o nephrolithiasis , h/o renal cell carcinoma , CKD Stage 3 Musculoskeletal arthritis Psychiatric anxiety , depression Objective Constitutional respirations regular, non-labored and within target range for patient.. Vitals Time Taken: 9:58 AM, Temperature: 97.7 F, Pulse: 112 bpm, Respiratory Rate: 20 breaths/min, Blood Pressure: 136/74 mmHg, Capillary Blood Glucose: 600 mg/dl. General Notes: Per patient takes insulin and 3 oral medications. Per patient PCP manages has referred to an endocrinologist. Per patient waiting for a call for an appt. Advised patient to call PCP with a follow up on that appt. patient in  agreement. Psychiatric pleasant and cooperative. General Notes: Left foot: T the left great toe amputation site there is a small open wound with granulation tissue present and depth that overlies bone. No o obvious signs of infection on exam. Distal portion of the second toe has an open wound with callus and granulation tissue present. No signs of infection. Integumentary (Hair, Skin) Wound #5 status is Open. Original cause of wound was Surgical Injury. The date acquired was: 12/27/2020. The wound has been in treatment 6 weeks. The wound is located on the Left Amputation Site - T The wound measures 0.1cm length x 0.1cm width x 0.5cm depth; 0.008cm^2 area and 0.004cm^3 volume. oe. There is Fat Layer (Subcutaneous Tissue) exposed. There is no tunneling noted, however, there is undermining starting at 9:00 and ending at 12:00 with a maximum distance of 0.7cm. There is a small amount of serosanguineous drainage noted. The wound margin is well defined and not attached to the wound base. There is large (67-100%) red, pink granulation within the wound bed. There is no necrotic tissue within the wound bed. Wound #6 status is Open. Original cause of wound was Gradually Appeared. The date acquired was: 04/15/2021. The wound is located on the Left T Second. oe The wound measures 0.1cm length x 0.3cm width x 0.3cm depth; 0.024cm^2 area and 0.007cm^3 volume. There is Fat Layer (Subcutaneous Tissue) exposed. There is no tunneling noted, however, there is undermining starting at 12:00 and ending at 12:00 with a maximum distance of 0.2cm. There is a small amount of serosanguineous drainage noted. The wound margin is thickened. There is large (67-100%) pink granulation within the wound bed. There is no necrotic tissue within the wound bed. Assessment Active Problems ICD-10 Unspecified open wound, left foot, subsequent encounter Type 2 diabetes mellitus with foot ulcer Chronic obstructive pulmonary disease,  unspecified Patient's wounds are stable. I recommended continuing silver alginate. We had a discussion about the importance of glycemic control in wound healing. Per EMR his hemoglobin A1c has been above 11 for the past 3 years. His most recent was 11.7 on 12/25/2020. I also referred him to infectious disease due to possible early osteo on imaging. I would like to see if he would benefit from prolonged antibiotics. I recommended aggressive offloading to the area. Follow- up in 1 week. Plan Follow-up Appointments: Return Appointment in 1 week. - Dr. Heber [place] on Tuesday ***Ensure to follow up with primary care provider concerning elevated blood glucose.*** ***Infectious Disease  appt 04/24/2021 ensure to go to appt this week.*** Bathing/ Shower/ Hygiene: May shower and wash wound with soap and water. - with dressing changes only. Edema Control - Lymphedema / SCD / Other: Elevate legs to the level of the heart or above for 30 minutes daily and/or when sitting, a frequency of: - throughout the day Avoid standing for long periods of time. Moisturize legs daily. - both legs and feet every night before bed. Off-Loading: Open toe surgical shoe to: - left foot WOUND #5: - Amputation Site - T oe Wound Laterality: Left Cleanser: Normal Saline (DME) (Generic) 1 x Per Day/30 Days Discharge Instructions: Cleanse the wound with Normal Saline prior to applying a clean dressing using gauze sponges, not tissue or cotton balls. Cleanser: Soap and Water 1 x Per Day/30 Days Discharge Instructions: May shower and wash wound with dial antibacterial soap and water prior to dressing change. Prim Dressing: KerraCel Ag Gelling Fiber Dressing, 4x5 in (silver alginate) (Generic) 1 x Per Day/30 Days ary Discharge Instructions: Apply silver alginate to wound bed as instructed Secondary Dressing: Woven Gauze Sponge, Non-Sterile 4x4 in (Generic) 1 x Per Day/30 Days Discharge Instructions: Apply over primary dressing as  directed. Secured With: [employer], 4.5x3.1 (in/yd) (Generic) 1 x Per Day/30 Days Discharge Instructions: Secure with Kerlix as directed. Secured With: 1M Medipore H Soft Cloth Surgical T ape, 2x2 (in/yd) (DME) (Generic) 1 x Per Day/30 Days Discharge Instructions: Secure dressing with tape as directed. WOUND #6: - T Second Wound Laterality: Left oe Cleanser: Normal Saline (DME) (Generic) 1 x Per Day/30 Days Discharge Instructions: Cleanse the wound with Normal Saline prior to applying a clean dressing using gauze sponges, not tissue or cotton balls. Prim Dressing: KerraCel Ag Gelling Fiber Dressing, 2x2 in (silver alginate) (Generic) 1 x Per Day/30 Days ary Discharge Instructions: Apply silver alginate to wound bed as instructed Secondary Dressing: Woven Gauze Sponges 2x2 in (Generic) 1 x Per Day/30 Days Discharge Instructions: Apply over primary dressing as directed. Secondary Dressing: Optifoam Non-Adhesive Dressing, 4x4 in (Generic) 1 x Per Day/30 Days Discharge Instructions: Apply over primary dressing to cushion tip of toe Secured With: Conforming Stretch Gauze Bandage, Sterile 2x75 (in/in) (Generic) 1 x Per Day/30 Days Discharge Instructions: Secure with stretch gauze as directed. Secured With: 1M Medipore H Soft Cloth Surgical T ape, 2x2 (in/yd) (DME) (Generic) 1 x Per Day/30 Days Discharge Instructions: Secure dressing with tape as directed. 1. Silver alginate 2. Aggressive offloading 3. Follow-up in 1 week Electronic Signature(s) Signed: 04/22/2021 1:19:34 PM By: Kalman Shan DO Entered By: Kalman Shan on 04/22/2021 13:18:41 -------------------------------------------------------------------------------- HxROS Details Patient Name: Date of Service: [name]. 04/21/2021 9:45 A M Medical Record Number: [id number] Patient Account Number: [id number] Date of Birth/Sex: Treating RN: 08/28/1955 (65 y.o. [name] Primary Care Provider: Carolee Rota Other Clinician: Referring Provider: Treating Provider/Extender: Maryagnes Amos in Treatment: 6 Information Obtained From Patient Constitutional Symptoms (General Health) Medical History: Past Medical History Notes: noncompliance , cocaine abuse ,hepatitis ,headaches Respiratory Medical History: Positive for: Chronic Obstructive Pulmonary Disease (COPD); Sleep Apnea - does not use CPAP Past Medical History Notes: h/o pneumonia , end stage chronic lung disease , COPD/ILD Cardiovascular Medical History: Positive for: Coronary Artery Disease - mx vessel; Hypertension Past Medical History Notes: h/o CVA , hyperlipidemia , coronary atherosclerosis , acute on chronic diastolic heart failure LVEF 60%-65% Gastrointestinal Medical History: Negative for: Cirrhosis ; Colitis; Crohns; Hepatitis A; Hepatitis B; Hepatitis C Past Medical History  Notes: cholecystitis , GERD Endocrine Medical History: Positive for: Type II Diabetes - last A1c - 13.3 Genitourinary Medical History: Past Medical History Notes: h/o nephrolithiasis , h/o renal cell carcinoma , CKD Stage 3 Musculoskeletal Medical History: Past Medical History Notes: arthritis Psychiatric Medical History: Past Medical History Notes: anxiety , depression Immunizations Pneumococcal Vaccine: Received Pneumococcal Vaccination: Yes Received Pneumococcal Vaccination On or After 60th Birthday: Yes Implantable Devices None Hospitalization / Surgery History Type of Hospitalization/Surgery appendectomy cholecystectomy laproscopic nephrectomy lapro lysis of lesions thorascopy lung biopsy bronchoscopy (L) heart cath with angio X3 endarcdectomy patch angioplasty left great toe amp. December 27, 2020 Family and Social History Cancer: Yes - Maternal Grandparents,Paternal Grandparents,Father,Mother; Diabetes: Yes - Mother,Father; Heart Disease: Yes - Maternal Grandparents,Mother,Father; Hereditary  Spherocytosis: No; Hypertension: No; Kidney Disease: Yes - Maternal Grandparents; Lung Disease: Yes - Father; Seizures: No; Stroke: Yes - Paternal Grandparents,Maternal Grandparents,Mother; Thyroid Problems: No; Tuberculosis: No; Current every day smoker - 1/2 pack a day; Alcohol Use: Never; Drug Use: No History; Caffeine Use: Daily - coffee, tea; Financial Concerns: No; Food, Clothing or Shelter Needs: No; Support System Lacking: No; Transportation Concerns: No Electronic Signature(s) Signed: 04/22/2021 1:19:34 PM By: Kalman Shan DO Signed: 04/22/2021 5:32:19 PM By: Deon Pilling RN, BSN Entered By: Kalman Shan on 04/22/2021 13:08:19 -------------------------------------------------------------------------------- [place] Details Patient Name: Date of Service: [name] 04/21/2021 Medical Record Number: [id number] Patient Account Number: [id number] Date of Birth/Sex: Treating RN: 1956/04/24 (65 y.o. [name] Primary Care Provider: Carolee Rota Other Clinician: Referring Provider: Treating Provider/Extender: Maryagnes Amos in Treatment: 6 Diagnosis Coding ICD-10 Codes Code Description [phone] Unspecified open wound, left foot, subsequent encounter E11.621 Type 2 diabetes mellitus with foot ulcer J44.9 Chronic obstructive pulmonary disease, unspecified Facility Procedures CPT4 Code: [id number] Description: 99214 - WOUND CARE VISIT-LEV 4 EST PT Modifier: Quantity: 1 Physician Procedures : CPT4 Code Description Modifier [id number] - WC PHYS LEVEL 3 - EST PT ICD-10 Diagnosis Description S91.302D Unspecified open wound, left foot, subsequent encounter E11.621 Type 2 diabetes mellitus with foot ulcer J44.9 Chronic obstructive pulmonary  disease, unspecified Quantity: 1 Electronic Signature(s) Signed: 04/22/2021 1:19:34 PM By: Kalman Shan DO Previous Signature: 04/21/2021 12:34:44 PM Version By: Deon Pilling RN, BSN Entered By:  Kalman Shan on 04/22/2021 13:19:01

## 2021-04-24 ENCOUNTER — Other Ambulatory Visit: Payer: Self-pay

## 2021-04-24 ENCOUNTER — Encounter: Payer: Self-pay | Admitting: Infectious Disease

## 2021-04-24 ENCOUNTER — Ambulatory Visit (HOSPITAL_COMMUNITY): Payer: Medicare Other | Admitting: Infectious Disease

## 2021-04-24 VITALS — BP 93/59 | HR 112 | Wt 225.0 lb

## 2021-04-24 DIAGNOSIS — J84117 Desquamative interstitial pneumonia: Secondary | ICD-10-CM | POA: Diagnosis not present

## 2021-04-24 DIAGNOSIS — J9621 Acute and chronic respiratory failure with hypoxia: Secondary | ICD-10-CM | POA: Diagnosis not present

## 2021-04-24 DIAGNOSIS — I10 Essential (primary) hypertension: Secondary | ICD-10-CM

## 2021-04-24 DIAGNOSIS — B188 Other chronic viral hepatitis: Secondary | ICD-10-CM

## 2021-04-24 DIAGNOSIS — E11621 Type 2 diabetes mellitus with foot ulcer: Secondary | ICD-10-CM

## 2021-04-24 DIAGNOSIS — N179 Acute kidney failure, unspecified: Secondary | ICD-10-CM | POA: Diagnosis not present

## 2021-04-24 DIAGNOSIS — Z89412 Acquired absence of left great toe: Secondary | ICD-10-CM | POA: Diagnosis not present

## 2021-04-24 DIAGNOSIS — N183 Chronic kidney disease, stage 3 unspecified: Secondary | ICD-10-CM

## 2021-04-24 DIAGNOSIS — M869 Osteomyelitis, unspecified: Secondary | ICD-10-CM | POA: Diagnosis not present

## 2021-04-24 DIAGNOSIS — E1142 Type 2 diabetes mellitus with diabetic polyneuropathy: Secondary | ICD-10-CM

## 2021-04-24 DIAGNOSIS — L97529 Non-pressure chronic ulcer of other part of left foot with unspecified severity: Secondary | ICD-10-CM | POA: Diagnosis not present

## 2021-04-24 DIAGNOSIS — J9622 Acute and chronic respiratory failure with hypercapnia: Secondary | ICD-10-CM

## 2021-04-24 DIAGNOSIS — S91109A Unspecified open wound of unspecified toe(s) without damage to nail, initial encounter: Secondary | ICD-10-CM | POA: Diagnosis not present

## 2021-04-24 DIAGNOSIS — J449 Chronic obstructive pulmonary disease, unspecified: Secondary | ICD-10-CM | POA: Diagnosis not present

## 2021-04-24 HISTORY — DX: Acquired absence of left great toe: Z89.412

## 2021-04-24 HISTORY — DX: Osteomyelitis, unspecified: M86.9

## 2021-04-24 NOTE — Progress Notes (Signed)
Reason for infectious disease consult: Diabetic foot infections with question of osteomyelitis now with the second metatarsal and residual potential osteomyelitis at first metatarsal residua at site of first ray amputation  Requesting physician: Kalman Shan, DO  Subjective:    Patient ID: [name], male    DOB: 16-Jul-1956, 65 y.o.   MRN: [id number]  HPI  [name] is a 65 year old Caucasian man with multiple medical problems including coronary artery disease status post coronary artery bypass grafting, hypertension hyperlipidemia renal cell carcinoma status nephrectomy and chronic kidney disease, peripheral vascular disease, Struve interstitial lung disease and COPD on chronic 6 L via nasal cannula who had osteomyelitis involving his first toe and was hospitalized this June and underwent first ray amputation with Dr. Posey Pronto.  Since then he was followed closely by Dr Posey Pronto and also referred to wound care clinic.  He has had problems with 1 site where the first toe was taken off failing to heal and apparently having some drainage from that site.  He also has had failure to resolve site of his second toe.  He had an MRI performed in late August which showed changes at the site of ray amputation that could be postsurgical versus residual infection, along with soft tissue swelling around this resection site and then new bony edema within the second metatarsal that was concerning for new osteomyelitis.  Had evidence also of a minimally displaced fracture of the base of the fifth metatarsal.  This was also new finding on exam.  He was referred to our clinic for evaluation of osteomyelitis involving these 2 sites.  The patient himself is telling me very emphatically that if he has osteomyelitis he is confident he will need surgery and if he needs surgery and removal of a toe he would prefer to have :"all of my toes removed"   I can certainly appreciate that he has multiple medical  problems and that anesthesia is not trivial for him and that he would have some peace of mind if he felt like he was done with dealing with infections in his foot and done with further surgeries and antibiotics.  I am highly skeptical in any case that treating him with antibiotics alone are going to have great effect on his outcome here absent more surgery.  He says he has essentially no sensation in his feet and that he could if he wanted to likely burn of cigarette out on his foot.  He does though have a sensation of shooting pain in his left foot that comes and goes.  He does not have fevers chills nausea or malaise.    Past Medical History:  Diagnosis Date   Anxiety    Arthritis    Cholecystitis, acute 12/20/2013   Lap chole 6/5   Cocaine abuse ([name])    COPD (chronic obstructive pulmonary disease) ([name])    Coronary atherosclerosis of native coronary artery    a. 03/09/2013 Cath/PCI: LM nl, LAD: 50p, 48m (2.5x16 promus DES), LCX nl, OM1 min irregs, LPL/LPDA diff dzs, RCA nondom, mod diff dzs, EF 55%.   Depression    Essential hypertension    GERD (gastroesophageal reflux disease)    Headache    Hepatitis Late 1970s   History of nephrolithiasis    History of pneumonia    History of stroke    Right MCA distribution, residual left-sided weakness   Hyperlipidemia    Noncompliance    Renal cell carcinoma ([name])    Status post radical  right nephrectomy August 2015   Sleep apnea    On CPAP, 4L O2 no cpap at home yet   Type 2 diabetes mellitus ([place]) 2011    Past Surgical History:  Procedure Laterality Date   AMPUTATION Left 12/27/2020   Procedure: PARTIAL FIRST RAY AMPUTATION OF FOOT;  Surgeon: Felipa Furnace, DPM;  Location: [place];  Service: Podiatry;  Laterality: Left;   APPENDECTOMY  1970's   CHOLECYSTECTOMY N/A 12/22/2013   Procedure: LAPAROSCOPIC CHOLECYSTECTOMY ;  Surgeon: [name], MD;  Location: [place];  Service: General;  Laterality: N/A;   ENDARTERECTOMY Right  10/08/2014   Procedure: Right ENDARTERECTOMY CAROTID;  Surgeon: [name], MD;  Location: [place];  Service: Vascular;  Laterality: Right;   LAPAROSCOPIC LYSIS OF ADHESIONS  02/21/2014   Procedure: LAPAROSCOPIC LYSIS OF ADHESIONS EXTINSIVE;  Surgeon: [name], MD;  Location: [facility];  Service: Urology;;   LEFT HEART CATHETERIZATION WITH CORONARY ANGIOGRAM N/A 06/02/2012   Procedure: LEFT HEART CATHETERIZATION WITH CORONARY ANGIOGRAM;  Surgeon: [name], MD;  Location: [facility] CATH LAB;  Service: Cardiovascular;  Laterality: N/A;   LEFT HEART CATHETERIZATION WITH CORONARY ANGIOGRAM N/A 03/09/2013   Procedure: LEFT HEART CATHETERIZATION WITH CORONARY ANGIOGRAM;  Surgeon: [name], MD;  Location: [facility] CATH LAB;  Service: Cardiovascular;  Laterality: N/A;   LEFT HEART CATHETERIZATION WITH CORONARY ANGIOGRAM N/A 12/20/2013   Procedure: LEFT HEART CATHETERIZATION WITH CORONARY ANGIOGRAM;  Surgeon: [name], MD;  Location: [facility] CATH LAB;  Service: Cardiovascular;  Laterality: N/A;   LUNG BIOPSY Left 05/18/2014   Procedure: LUNG BIOPSY left upper lobe & left lower lobe;  Surgeon: [name], MD;  Location: [place];  Service: Thoracic;  Laterality: Left;   PATCH ANGIOPLASTY Right 10/08/2014   Procedure: PATCH ANGIOPLASTY Right Carotid;  Surgeon: [name], MD;  Location: [place];  Service: Vascular;  Laterality: Right;   ROBOT ASSISTED LAPAROSCOPIC NEPHRECTOMY Right 02/21/2014   Procedure: ROBOTIC ASSISTED LAPAROSCOPIC RIGHT NEPHRECTOMY ;  Surgeon: [name], MD;  Location: [facility];  Service: Urology;  Laterality: Right;   VIDEO ASSISTED THORACOSCOPY Left 05/18/2014   Procedure: LEFT VIDEO ASSISTED THORACOSCOPY;  Surgeon: [name], MD;  Location: [place];  Service: Thoracic;  Laterality: Left;   VIDEO BRONCHOSCOPY  05/18/2014   Procedure: VIDEO BRONCHOSCOPY;  Surgeon: [name], MD;  Location: [facility] OR;  Service: Thoracic;;    Family History  Problem  Relation Age of Onset   Cancer Mother        Thyroid - living in her 70's.   Hypertension Other    Diabetes Other    Stroke Other    Lung cancer Father    CAD Father    Cancer Maternal Grandmother        Breast   Cancer Maternal Grandfather        Throat and stomach   CAD Brother       Social History   Socioeconomic History   Marital status: Single    Spouse name: Not on file   Number of children: Not on file   Years of education: Not on file   Highest education level: Not on file  Occupational History   Occupation: N/A  Tobacco Use   Smoking status: Some Days    Packs/day: 1.00    Years: 41.00    Pack years: 41.00    Types: Cigarettes    Start date: 05/03/1972   Smokeless tobacco: Never   Tobacco comments:  Smoking 5cigs per day as of 02/26/21  Vaping Use   Vaping Use: Never used  Substance and Sexual Activity   Alcohol use: No    Alcohol/week: 0.0 standard drinks   Drug use: No    Types: Cocaine    Comment: last used cocaine 11/2013. but is cocaine + this admit July 2015   Sexual activity: Never  Other Topics Concern   Not on file  Social History Narrative   Lives in [place] with his mother.  He does not work.   Social Determinants of Health   Financial Resource Strain: Not on file  Food Insecurity: Not on file  Transportation Needs: Not on file  Physical Activity: Not on file  Stress: Not on file  Social Connections: Not on file    Allergies  Allergen Reactions   Lisinopril Other (See Comments)    Other reaction(s): worsening kidney function   Oxycodone Itching and Nausea Only    Pt is able to tolerate if taken with benadryl     Current Outpatient Medications:    acetaminophen (TYLENOL) 325 MG tablet, Take 650 mg by mouth as needed (pain)., Disp: , Rfl:    ALPRAZolam (XANAX) 0.5 MG tablet, Take 1 tablet (0.5 mg total) by mouth 3 (three) times daily as needed for anxiety., Disp: , Rfl: 0   ALPRAZolam (XANAX) 1 MG tablet, Take 1 mg by mouth  3 (three) times daily as needed., Disp: , Rfl:    atorvastatin (LIPITOR) 20 MG tablet, Take 20 mg by mouth at bedtime., Disp: , Rfl: 1   Budeson-Glycopyrrol-Formoterol (BREZTRI AEROSPHERE) 160-9-4.8 MCG/ACT AERO, Inhale 2 puffs into the lungs in the morning and at bedtime., Disp: 10.7 g, Rfl: 5   budesonide (PULMICORT) 1 MG/2ML nebulizer solution, Take 1 mg by nebulization as needed (shortness of breath)., Disp: , Rfl:    clopidogrel (PLAVIX) 75 MG tablet, TAKE ONE TABLET BY MOUTH EVERY DAY, Disp: 90 tablet, Rfl: 2   diltiazem (CARDIZEM CD) 180 MG 24 hr capsule, TAKE ONE CAPSULE BY MOUTH EVERY DAY, Disp: 90 capsule, Rfl: 2   diphenhydrAMINE (BENADRYL) 25 mg capsule, Take 25 mg by mouth every 6 (six) hours as needed for itching (take with percocet)., Disp: , Rfl:    docusate sodium (COLACE) 100 MG capsule, Take 200 mg by mouth at bedtime. , Disp: , Rfl:    esomeprazole (NEXIUM) 40 MG capsule, Take 40 mg by mouth at bedtime. Pt only take as needed, Disp: , Rfl:    FLUoxetine (PROZAC) 20 MG capsule, Take by mouth., Disp: , Rfl:    FLUoxetine (PROZAC) 40 MG capsule, Take 40 mg by mouth daily. Take with 10mg  capsule for a total of 50mg  daily., Disp: , Rfl:    formoterol (PERFOROMIST) 20 MCG/2ML nebulizer solution, Take 20 mcg by nebulization as needed (shortness of breath)., Disp: , Rfl:    gabapentin (NEURONTIN) 300 MG capsule, Take 900 mg by mouth 3 (three) times daily., Disp: , Rfl:    glucose blood (TRUE METRIX BLOOD GLUCOSE TEST) test strip, , Disp: , Rfl:    insulin regular human CONCENTRATED (HUMULIN R) 500 UNIT/ML injection, Inject 90 Units into the skin 3 (three) times daily with meals., Disp: , Rfl:    isosorbide mononitrate (IMDUR) 60 MG 24 hr tablet, Take 1 tablet (60 mg total) by mouth daily., Disp: , Rfl:    linagliptin (TRADJENTA) 5 MG TABS tablet, Take 5 mg by mouth daily., Disp: , Rfl:    nitroGLYCERIN (NITROSTAT) 0.4 MG SL  tablet, PLACE ONE tablet UNDER THE TONGUE every FIVE minutes as  needed FOR CHEST PAIN. IF 3 TABLETS ARE NECESSARY CALL 911, Disp: 25 tablet, Rfl: 3   oxybutynin (DITROPAN-XL) 10 MG 24 hr tablet, Take 10 mg by mouth daily., Disp: , Rfl: 11   potassium chloride SA (K-DUR,KLOR-CON) 20 MEQ tablet, Take 20 mEq by mouth 2 (two) times daily. , Disp: , Rfl:    pregabalin (LYRICA) 25 MG capsule, Take 25 mg by mouth 2 (two) times daily., Disp: , Rfl:    Probiotic Product (PROBIOTIC PO), Take 1 capsule by mouth daily., Disp: , Rfl:    sucralfate (CARAFATE) 1 g tablet, TAKE ONE TABLET BY MOUTH TWICE DAILY ON an empty stomach (Patient not taking: Reported on 02/26/2021), Disp: , Rfl:    tamsulosin (FLOMAX) 0.4 MG CAPS capsule, Take 0.4 mg by mouth daily. , Disp: , Rfl:    torsemide (DEMADEX) 20 MG tablet, TAKE THREE TABLETS BY MOUTH TWICE DAILY, Disp: 540 tablet, Rfl: 0   zolpidem (AMBIEN) 5 MG tablet, Take 5 mg by mouth at bedtime. For insomnia., Disp: , Rfl:    Review of Systems  Constitutional:  Negative for activity change, appetite change, chills, diaphoresis, fatigue, fever and unexpected weight change.  HENT:  Negative for congestion, rhinorrhea, sinus pressure, sneezing, sore throat and trouble swallowing.   Eyes:  Negative for photophobia and visual disturbance.  Respiratory:  Negative for cough, chest tightness, shortness of breath, wheezing and stridor.   Cardiovascular:  Negative for chest pain, palpitations and leg swelling.  Gastrointestinal:  Negative for abdominal distention, abdominal pain, anal bleeding, blood in stool, constipation, diarrhea, nausea and vomiting.  Genitourinary:  Negative for difficulty urinating, dysuria, flank pain and hematuria.  Musculoskeletal:  Negative for arthralgias, back pain, gait problem, joint swelling and myalgias.  Skin:  Positive for wound. Negative for color change, pallor and rash.  Neurological:  Negative for dizziness, tremors, weakness and light-headedness.  Hematological:  Negative for adenopathy. Does not  bruise/bleed easily.  Psychiatric/Behavioral:  Positive for dysphoric mood. Negative for agitation, behavioral problems, confusion, decreased concentration and sleep disturbance.       Objective:   Physical Exam Constitutional:      Appearance: He is well-developed.  HENT:     Head: Normocephalic and atraumatic.  Eyes:     Conjunctiva/sclera: Conjunctivae normal.  Cardiovascular:     Rate and Rhythm: Regular rhythm. Tachycardia present.  Pulmonary:     Effort: Pulmonary effort is normal. No respiratory distress.     Breath sounds: No wheezing.  Abdominal:     General: There is no distension.     Palpations: Abdomen is soft.  Musculoskeletal:     Cervical back: Normal range of motion and neck supple.  Skin:    General: Skin is warm and dry.     Coloration: Skin is not pale.     Findings: No erythema or rash.  Neurological:     General: No focal deficit present.     Mental Status: He is alert and oriented to person, place, and time.  Psychiatric:        Mood and Affect: Mood normal.        Behavior: Behavior normal.        Thought Content: Thought content normal.        Judgment: Judgment normal.     Left foot 04/24/2021:       Right foot 04/24/2021:        Assessment & Plan:  Osteomyelitis in second metatarsal and residual of first metatarsal on MRI:  I we will send message to Dr. Posey Pronto through epic.  My thought would be to first see Dr. Posey Pronto for consideration of more proximal amputation.  The patient is clearly preferring this to treating with antibiotics without surgery.  I also do not think antibiotics without surgery will be effective.  If Dr. Posey Pronto does not think that he has indication for surgery the patient will need a second opinion with orthopedic surgery which I be happy to facilitate.  I am scheduling the patient with me in roughly a month's time with the presumption that he will in the interim likely have more surgery where intraoperative  cultures can be sent to guide antimicrobial therapy that we would hope to use in a "mop up fashion to help eradicate infection in his foot.  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  History of hepatitis: He believes he had a viral hepatitis when he was younger his hepatitis C antibody was negative but I do not see his testing for hepatitis B or A.  He has tested negative for HIV repeatedly and says he has not been sexually active for several decades.  I will check a total hepatitis a antibody panel level hepatitis B surface antigen and surface antibody.  His hepatitis C antibody was negative.  I spent 82 minutes with the patient including than 50% of the time in face to face counseling of the patient regarding the nature of diabetic foot infections and need for multidisciplinary surgical [place] antimicrobial nutritional and wound care approach, how intraoperative cultures can guide therapy personally reviewing MRI of the foot performed on March 14, 2021, she will brachial indices performed on December 26, 2020, CBC and BMP along with review of medical records in preparation for the visit and during the visit and in coordination of his care.

## 2021-04-25 ENCOUNTER — Telehealth: Payer: Self-pay

## 2021-04-25 NOTE — Telephone Encounter (Signed)
Called patient to relay his blood sugar level. He sounded very lethargic on the phone and states he's "not doing well."   He says he has not checked his blood sugar this morning, but that he feels confused and tired. He says his mother is in the house with him, but that she cannot drive. RN relayed that his glucose level was worrisome paired with his lethargy and advised him to go to the emergency department. He says he will either get a neighbor to take him or call 911.   RN had patient repeat the plan to ensure he understood the importance of calling 911 if no one else was able to take him to the hospital. Patient verbalized understanding and has no further questions.   Spoke with Earnest Bailey at [facility], patient's PCP and notified her that our office would be faxing over lab work. She says patient was last seen 02/04/21.   Beryle Flock, RN

## 2021-04-25 NOTE — Telephone Encounter (Signed)
-----   Message from Truman Hayward, MD sent at 04/25/2021  8:18 AM EDT ----- Regarding: another critical glucose level. pt needs to be checking regularly he is difficult to control per his account  ----- Message ----- From: Interface, [facility] Lab Results In Sent: 04/24/2021  10:37 PM EDT To: Truman Hayward, MD

## 2021-04-28 ENCOUNTER — Encounter (HOSPITAL_BASED_OUTPATIENT_CLINIC_OR_DEPARTMENT_OTHER): Payer: Medicare Other | Admitting: Internal Medicine

## 2021-04-28 ENCOUNTER — Other Ambulatory Visit: Payer: Self-pay

## 2021-04-28 DIAGNOSIS — I5032 Chronic diastolic (congestive) heart failure: Secondary | ICD-10-CM | POA: Diagnosis not present

## 2021-04-28 DIAGNOSIS — I130 Hypertensive heart and chronic kidney disease with heart failure and stage 1 through stage 4 chronic kidney disease, or unspecified chronic kidney disease: Secondary | ICD-10-CM | POA: Diagnosis not present

## 2021-04-28 DIAGNOSIS — S91302D Unspecified open wound, left foot, subsequent encounter: Secondary | ICD-10-CM | POA: Diagnosis not present

## 2021-04-28 DIAGNOSIS — M86672 Other chronic osteomyelitis, left ankle and foot: Secondary | ICD-10-CM

## 2021-04-28 DIAGNOSIS — E11621 Type 2 diabetes mellitus with foot ulcer: Secondary | ICD-10-CM | POA: Diagnosis not present

## 2021-04-28 DIAGNOSIS — J449 Chronic obstructive pulmonary disease, unspecified: Secondary | ICD-10-CM | POA: Diagnosis not present

## 2021-04-28 DIAGNOSIS — L97529 Non-pressure chronic ulcer of other part of left foot with unspecified severity: Secondary | ICD-10-CM | POA: Diagnosis not present

## 2021-04-28 DIAGNOSIS — E1122 Type 2 diabetes mellitus with diabetic chronic kidney disease: Secondary | ICD-10-CM | POA: Diagnosis not present

## 2021-04-28 DIAGNOSIS — Z794 Long term (current) use of insulin: Secondary | ICD-10-CM | POA: Diagnosis not present

## 2021-04-28 DIAGNOSIS — N183 Chronic kidney disease, stage 3 unspecified: Secondary | ICD-10-CM | POA: Diagnosis not present

## 2021-04-28 DIAGNOSIS — F17210 Nicotine dependence, cigarettes, uncomplicated: Secondary | ICD-10-CM | POA: Diagnosis not present

## 2021-04-28 DIAGNOSIS — Z9981 Dependence on supplemental oxygen: Secondary | ICD-10-CM | POA: Diagnosis not present

## 2021-04-28 DIAGNOSIS — T8189XA Other complications of procedures, not elsewhere classified, initial encounter: Secondary | ICD-10-CM | POA: Diagnosis not present

## 2021-04-28 DIAGNOSIS — Z89412 Acquired absence of left great toe: Secondary | ICD-10-CM | POA: Diagnosis not present

## 2021-04-28 NOTE — Progress Notes (Signed)
LENNIN OSMOND ([id number]) , Visit Report for 04/28/2021 Chief Complaint Document Details Patient Name: Date of Service: ALVIA, TORY 04/28/2021 10:00 A M Medical Record Number: [id number] Patient Account Number: [id number] Date of Birth/Sex: Treating RN: 21-Apr-1956 (65 y.o. [name] Primary Care Provider: Carolee Rota Other Clinician: Referring Provider: Treating Provider/Extender: Maryagnes Amos in Treatment: 7 Information Obtained from: Patient Chief Complaint non healing to the left great toe surgical wound site s/p amputation Electronic Signature(s) Signed: 04/28/2021 10:34:39 AM By: Kalman Shan DO Entered By: Kalman Shan on 04/28/2021 10:25:45 -------------------------------------------------------------------------------- HPI Details Patient Name: Date of Service: [name]. 04/28/2021 10:00 A M Medical Record Number: [id number] Patient Account Number: [id number] Date of Birth/Sex: Treating RN: [birth year] (65 y.o. [name] Primary Care Provider: Carolee Rota Other Clinician: Referring Provider: Treating Provider/Extender: Maryagnes Amos in Treatment: 7 History of Present Illness HPI Description: Admission 8/18 Mr. Antario Yasuda is a 65 year old male with a past medical history of uncontrolled insulin-dependent type 2 diabetes, COPD On O2 6 L via nasal cannula That presents to the clinic for a 79-month history of nonhealing wound to the left great toe amputation site. He has been following with podiatry for a wound to his left great toe that developed osteomyelitis and subsequently required amputation. He had the surgery done on 6/10. He had a partial first ray amputation by Dr. Posey Pronto. Since his surgery he has been placing iodine on the wound bed. He states that there has been no improvement to the wound healing in the past 2 months. He states he has occasional intermittent pain to the  area. He denies systemic signs of infection. 8/25; patient presents for 1 week follow-up. He states he has been using silver alginate daily without issues. He has no complaints today. He denies signs of infection. He is scheduled to have his MRI on 8/27. 9/15; patient presents for follow-up. He missed his last clinic appointment due to feeling ill. He continues to use silver alginate daily without any issues. He reports improvement in wound healing. He currently denies signs of infection. 9/27; patient presents for follow-up. He has developed a new wound to the second left toe. He is unaware of this and was noted on exam today. He continues to use silver alginate to the amputation site wound. He has no issues or complaints today. He currently denies signs of infection. 10/4; patient presents for follow-up. He is using silver alginate to the wound sites. He has no issues or complaints today. He denies systemic signs of infection. 10/10; patient presents for follow-up. He has been using silver alginate to the wound sites. He states that his foot became more swollen over the weekend, But has since improved. He overall feels okay. Electronic Signature(s) Signed: 04/28/2021 10:34:39 AM By: Kalman Shan DO Entered By: Kalman Shan on 04/28/2021 10:26:42 -------------------------------------------------------------------------------- Physical Exam Details Patient Name: Date of Service: [name]. 04/28/2021 10:00 A M Medical Record Number: [id number] Patient Account Number: [id number] Date of Birth/Sex: Treating RN: 1956/05/30 (65 y.o. [name] Primary Care Provider: Carolee Rota Other Clinician: Referring Provider: Treating Provider/Extender: Maryagnes Amos in Treatment: 7 Constitutional respirations regular, non-labored and within target range for patient.[name] Psychiatric pleasant and cooperative. Notes Left foot: T the left great toe amputation  site there is a small open wound with granulation tissue present and depth that overlies bone. No obvious signs of o infection on exam. The Distal portion of the second toe  has an open wound with callus and purulent drainage. There is increased warmth and erythema present. Electronic Signature(s) Signed: 04/28/2021 10:34:39 AM By: Kalman Shan DO Entered By: Kalman Shan on 04/28/2021 10:27:45 -------------------------------------------------------------------------------- Physician Orders Details Patient Name: Date of Service: [name]. 04/28/2021 10:00 A M Medical Record Number: [id number] Patient Account Number: [id number] Date of Birth/Sex: Treating RN: 1955/11/12 (65 y.o. [name] Primary Care Provider: Carolee Rota Other Clinician: Referring Provider: Treating Provider/Extender: Maryagnes Amos in Treatment: 7 Verbal / Phone Orders: No Diagnosis Coding ICD-10 Coding Code Description S91.302D Unspecified open wound, left foot, subsequent encounter E11.621 Type 2 diabetes mellitus with foot ulcer J44.9 Chronic obstructive pulmonary disease, unspecified Follow-up Appointments ppointment in 1 week. - Dr. Heber [place] on Tuesday Return A ****Pick up oral antibiotics today a pharmacy.**** ***Call Dr. Posey Pronto office today to move up scheduled appt sooner related to infection to toe.**** Bathing/ Shower/ Hygiene May shower and wash wound with soap and water. - with dressing changes only. Edema Control - Lymphedema / SCD / Other Elevate legs to the level of the heart or above for 30 minutes daily and/or when sitting, a frequency of: - throughout the day Avoid standing for long periods of time. Moisturize legs daily. - both legs and feet every night before bed. Off-Loading Open toe surgical shoe to: - left foot Wound Treatment Wound #5 - Amputation Site - Toe Wound Laterality: Left Cleanser: Normal Saline (Generic) 1 x Per Day/30  Days Discharge Instructions: Cleanse the wound with Normal Saline prior to applying a clean dressing using gauze sponges, not tissue or cotton balls. Cleanser: Soap and Water 1 x Per Day/30 Days Discharge Instructions: May shower and wash wound with dial antibacterial soap and water prior to dressing change. Prim Dressing: KerraCel Ag Gelling Fiber Dressing, 4x5 in (silver alginate) (Generic) 1 x Per Day/30 Days ary Discharge Instructions: Apply silver alginate to wound bed as instructed Secondary Dressing: Woven Gauze Sponge, Non-Sterile 4x4 in (Generic) 1 x Per Day/30 Days Discharge Instructions: Apply over primary dressing as directed. Secured With: [employer], 4.5x3.1 (in/yd) (Generic) 1 x Per Day/30 Days Discharge Instructions: Secure with Kerlix as directed. Secured With: 10M Medipore H Soft Cloth Surgical Tape, 2x2 (in/yd) (Generic) 1 x Per Day/30 Days Discharge Instructions: Secure dressing with tape as directed. Wound #6 - T Second oe Wound Laterality: Left Cleanser: Normal Saline (Generic) 1 x Per Day/30 Days Discharge Instructions: Cleanse the wound with Normal Saline prior to applying a clean dressing using gauze sponges, not tissue or cotton balls. Prim Dressing: KerraCel Ag Gelling Fiber Dressing, 2x2 in (silver alginate) (Generic) 1 x Per Day/30 Days ary Discharge Instructions: Apply silver alginate to wound bed as instructed Secondary Dressing: Woven Gauze Sponges 2x2 in (Generic) 1 x Per Day/30 Days Discharge Instructions: Apply over primary dressing as directed. Secondary Dressing: Optifoam Non-Adhesive Dressing, 4x4 in (Generic) 1 x Per Day/30 Days Discharge Instructions: Apply over primary dressing to cushion tip of toe Secured With: Conforming Stretch Gauze Bandage, Sterile 2x75 (in/in) (Generic) 1 x Per Day/30 Days Discharge Instructions: Secure with stretch gauze as directed. Secured With: 10M Medipore H Soft Cloth Surgical Tape, 2x2 (in/yd) (Generic) 1 x Per  Day/30 Days Discharge Instructions: Secure dressing with tape as directed. Laboratory naerobe culture (MICRO) - Culture of left foot second toe. Bacteria identified in Unspecified specimen by A LOINC Code: 191-4 Convenience Name: Anerobic culture Patient Medications llergies: oxycodone, lisinopril A Notifications Medication Indication Start End 04/28/2021 clindamycin HCl DOSE  1 - oral 300 mg capsule - 1 capsule oral QID x 7 days Electronic Signature(s) Signed: 04/28/2021 10:29:05 AM By: Kalman Shan DO Entered By: Kalman Shan on 04/28/2021 10:29:04 -------------------------------------------------------------------------------- Problem List Details Patient Name: Date of Service: [name]. 04/28/2021 10:00 A M Medical Record Number: [id number] Patient Account Number: [id number] Date of Birth/Sex: Treating RN: [birth year] (65 y.o. [name] Primary Care Provider: Carolee Rota Other Clinician: Referring Provider: Treating Provider/Extender: Maryagnes Amos in Treatment: 7 Active Problems ICD-10 Encounter Code Description Active Date MDM Diagnosis S91.302D Unspecified open wound, left foot, subsequent encounter 03/13/2021 No Yes E11.621 Type 2 diabetes mellitus with foot ulcer 03/06/2021 No Yes J44.9 Chronic obstructive pulmonary disease, unspecified 03/06/2021 No Yes Inactive Problems ICD-10 Code Description Active Date Inactive Date S91.302A Unspecified open wound, left foot, initial encounter 03/06/2021 03/06/2021 Resolved Problems Electronic Signature(s) Signed: 04/28/2021 10:34:39 AM By: Kalman Shan DO Entered By: Kalman Shan on 04/28/2021 10:25:19 -------------------------------------------------------------------------------- Progress Note Details Patient Name: Date of Service: [name]. 04/28/2021 10:00 A M Medical Record Number: [id number] Patient Account Number: [id number] Date of Birth/Sex: Treating  RN: [birth year] (65 y.o. [name] Primary Care Provider: Carolee Rota Other Clinician: Referring Provider: Treating Provider/Extender: Maryagnes Amos in Treatment: 7 Subjective Chief Complaint Information obtained from Patient non healing to the left great toe surgical wound site s/p amputation History of Present Illness (HPI) Admission 8/18 Mr. Tywaun Hiltner is a 65 year old male with a past medical history of uncontrolled insulin-dependent type 2 diabetes, COPD On O2 6 L via nasal cannula That presents to the clinic for a 62-month history of nonhealing wound to the left great toe amputation site. He has been following with podiatry for a wound to his left great toe that developed osteomyelitis and subsequently required amputation. He had the surgery done on 6/10. He had a partial first ray amputation by Dr. Posey Pronto. Since his surgery he has been placing iodine on the wound bed. He states that there has been no improvement to the wound healing in the past 2 months. He states he has occasional intermittent pain to the area. He denies systemic signs of infection. 8/25; patient presents for 1 week follow-up. He states he has been using silver alginate daily without issues. He has no complaints today. He denies signs of infection. He is scheduled to have his MRI on 8/27. 9/15; patient presents for follow-up. He missed his last clinic appointment due to feeling ill. He continues to use silver alginate daily without any issues. He reports improvement in wound healing. He currently denies signs of infection. 9/27; patient presents for follow-up. He has developed a new wound to the second left toe. He is unaware of this and was noted on exam today. He continues to use silver alginate to the amputation site wound. He has no issues or complaints today. He currently denies signs of infection. 10/4; patient presents for follow-up. He is using silver alginate to the wound  sites. He has no issues or complaints today. He denies systemic signs of infection. 10/10; patient presents for follow-up. He has been using silver alginate to the wound sites. He states that his foot became more swollen over the weekend, But has since improved. He overall feels okay. Patient History Information obtained from Patient. Family History Cancer - Maternal Grandparents,Paternal Grandparents,Father,Mother, Diabetes - Mother,Father, Heart Disease - Maternal Grandparents,Mother,Father, Kidney Disease - Maternal Grandparents, Lung Disease - Father, Stroke - Paternal Grandparents,Maternal Grandparents,Mother, No family history of Hereditary  Spherocytosis, Hypertension, Seizures, Thyroid Problems, Tuberculosis. Social History Current every day smoker - 1/2 pack a day, Alcohol Use - Never, Drug Use - No History, Caffeine Use - Daily - coffee, tea. Medical History Respiratory Patient has history of Chronic Obstructive Pulmonary Disease (COPD), Sleep Apnea - does not use CPAP Cardiovascular Patient has history of Coronary Artery Disease - mx vessel, Hypertension Gastrointestinal Denies history of Cirrhosis , Colitis, Crohnoos, Hepatitis A, Hepatitis B, Hepatitis C Endocrine Patient has history of Type II Diabetes - last A1c - 13.3 Hospitalization/Surgery History - appendectomy. - cholecystectomy. - laproscopic nephrectomy. - lapro lysis of lesions. - thorascopy. - lung biopsy. - bronchoscopy. - (L) heart cath with angio X3. - endarcdectomy. - patch angioplasty. - left great toe amp. December 27, 2020. Medical A Surgical History Notes nd Constitutional Symptoms (General Health) noncompliance , cocaine abuse ,hepatitis ,headaches Respiratory h/o pneumonia , end stage chronic lung disease , COPD/ILD Cardiovascular h/o CVA , hyperlipidemia , coronary atherosclerosis , acute on chronic diastolic heart failure LVEF 60%-65% Gastrointestinal cholecystitis , GERD Genitourinary h/o  nephrolithiasis , h/o renal cell carcinoma , CKD Stage 3 Musculoskeletal arthritis Psychiatric anxiety , depression Objective Constitutional respirations regular, non-labored and within target range for patient.. Vitals Time Taken: 9:40 AM, T emperature: 98.5 F, Pulse: 101 bpm, Respiratory Rate: 20 breaths/min, Blood Pressure: 179/94 mmHg, Capillary Blood Glucose: 310 mg/dl. General Notes: Per patient sees PCP this afternoon at 1430 related to blood glucose. Discussed at length with patient the concern of a regular pepsi bottle in his hand in relation to elevated blood glucose and diabetic ulcers. Psychiatric pleasant and cooperative. General Notes: Left foot: T the left great toe amputation site there is a small open wound with granulation tissue present and depth that overlies bone. No o obvious signs of infection on exam. The Distal portion of the second toe has an open wound with callus and purulent drainage. There is increased warmth and erythema present. Integumentary (Hair, Skin) Wound #5 status is Open. Original cause of wound was Surgical Injury. The date acquired was: 12/27/2020. The wound has been in treatment 7 weeks. The wound is located on the Left Amputation Site - T The wound measures 0.2cm length x 0.2cm width x 0.6cm depth; 0.031cm^2 area and 0.019cm^3 volume. oe. There is Fat Layer (Subcutaneous Tissue) exposed. There is no tunneling or undermining noted. There is a small amount of serosanguineous drainage noted. The wound margin is well defined and not attached to the wound base. There is large (67-100%) red, pink granulation within the wound bed. There is no necrotic tissue within the wound bed. General Notes: callous to periwound. Wound #6 status is Open. Original cause of wound was Gradually Appeared. The date acquired was: 04/15/2021. The wound has been in treatment 1 weeks. The wound is located on the Left T Second. The wound measures 0.3cm length x 0.3cm width x  0.5cm depth; 0.071cm^2 area and 0.035cm^3 volume. There is oe Fat Layer (Subcutaneous Tissue) exposed. There is no tunneling noted, however, there is undermining starting at 12:00 and ending at 12:00 with a maximum distance of 0.5cm. There is a medium amount of serosanguineous drainage noted. The wound margin is thickened. There is large (67-100%) pink granulation within the wound bed. There is no necrotic tissue within the wound bed. General Notes: callous to periwound. Assessment Active Problems ICD-10 Unspecified open wound, left foot, subsequent encounter Type 2 diabetes mellitus with foot ulcer Chronic obstructive pulmonary disease, unspecified Patient's amputation site is stable. Unfortunately patient  has worsening of the second left toe. There is purulent drainage. I will treat with clindamycin. Patient was evaluated by infectious disease, Dr. Tommy Medal on 10/6. It was suggested he needs additional surgery to address his osteomyelitis and antibiotics alone would not be sufficient. Since patient's second toe appears infected and with a history of osteomyelitis to the second metatarsal he will likely need amputation of at least this site. He states he wants all his toes removed. Patient is currently scheduled to see Dr. Posey Pronto on 11/2. He states he will call to try and get a sooner appointment. He if he is unable to do so we will call and try and facilitate this. Follow-up in 1 week. Plan Follow-up Appointments: Return Appointment in 1 week. - Dr. Heber [place] on Tuesday ****Pick up oral antibiotics today a pharmacy.**** ***Call Dr. Posey Pronto office today to move up scheduled appt sooner related to infection to toe.**** Bathing/ Shower/ Hygiene: May shower and wash wound with soap and water. - with dressing changes only. Edema Control - Lymphedema / SCD / Other: Elevate legs to the level of the heart or above for 30 minutes daily and/or when sitting, a frequency of: - throughout the day Avoid  standing for long periods of time. Moisturize legs daily. - both legs and feet every night before bed. Off-Loading: Open toe surgical shoe to: - left foot Laboratory ordered were: Anerobic culture - Culture of left foot second toe. The following medication(s) was prescribed: clindamycin HCl oral 300 mg capsule 1 1 capsule oral QID x 7 days starting 04/28/2021 WOUND #5: - Amputation Site - T oe Wound Laterality: Left Cleanser: Normal Saline (Generic) 1 x Per Day/30 Days Discharge Instructions: Cleanse the wound with Normal Saline prior to applying a clean dressing using gauze sponges, not tissue or cotton balls. Cleanser: Soap and Water 1 x Per Day/30 Days Discharge Instructions: May shower and wash wound with dial antibacterial soap and water prior to dressing change. Prim Dressing: KerraCel Ag Gelling Fiber Dressing, 4x5 in (silver alginate) (Generic) 1 x Per Day/30 Days ary Discharge Instructions: Apply silver alginate to wound bed as instructed Secondary Dressing: Woven Gauze Sponge, Non-Sterile 4x4 in (Generic) 1 x Per Day/30 Days Discharge Instructions: Apply over primary dressing as directed. Secured With: [employer], 4.5x3.1 (in/yd) (Generic) 1 x Per Day/30 Days Discharge Instructions: Secure with Kerlix as directed. Secured With: 87M Medipore H Soft Cloth Surgical T ape, 2x2 (in/yd) (Generic) 1 x Per Day/30 Days Discharge Instructions: Secure dressing with tape as directed. WOUND #6: - T Second Wound Laterality: Left oe Cleanser: Normal Saline (Generic) 1 x Per Day/30 Days Discharge Instructions: Cleanse the wound with Normal Saline prior to applying a clean dressing using gauze sponges, not tissue or cotton balls. Prim Dressing: KerraCel Ag Gelling Fiber Dressing, 2x2 in (silver alginate) (Generic) 1 x Per Day/30 Days ary Discharge Instructions: Apply silver alginate to wound bed as instructed Secondary Dressing: Woven Gauze Sponges 2x2 in (Generic) 1 x Per Day/30  Days Discharge Instructions: Apply over primary dressing as directed. Secondary Dressing: Optifoam Non-Adhesive Dressing, 4x4 in (Generic) 1 x Per Day/30 Days Discharge Instructions: Apply over primary dressing to cushion tip of toe Secured With: Conforming Stretch Gauze Bandage, Sterile 2x75 (in/in) (Generic) 1 x Per Day/30 Days Discharge Instructions: Secure with stretch gauze as directed. Secured With: 87M Medipore H Soft Cloth Surgical T ape, 2x2 (in/yd) (Generic) 1 x Per Day/30 Days Discharge Instructions: Secure dressing with tape as directed. 1. Clindamycin 2. Silver alginate  3. Follow-up with Dr. Posey Pronto Electronic Signature(s) Signed: 04/28/2021 10:34:39 AM By: Kalman Shan DO Entered By: Kalman Shan on 04/28/2021 10:33:32 -------------------------------------------------------------------------------- HxROS Details Patient Name: Date of Service: [name]. 04/28/2021 10:00 A M Medical Record Number: [id number] Patient Account Number: [id number] Date of Birth/Sex: Treating RN: Jul 22, 1955 (65 y.o. [name] Primary Care Provider: Carolee Rota Other Clinician: Referring Provider: Treating Provider/Extender: Maryagnes Amos in Treatment: 7 Information Obtained From Patient Constitutional Symptoms (General Health) Medical History: Past Medical History Notes: noncompliance , cocaine abuse ,hepatitis ,headaches Respiratory Medical History: Positive for: Chronic Obstructive Pulmonary Disease (COPD); Sleep Apnea - does not use CPAP Past Medical History Notes: h/o pneumonia , end stage chronic lung disease , COPD/ILD Cardiovascular Medical History: Positive for: Coronary Artery Disease - mx vessel; Hypertension Past Medical History Notes: h/o CVA , hyperlipidemia , coronary atherosclerosis , acute on chronic diastolic heart failure LVEF 60%-65% Gastrointestinal Medical History: Negative for: Cirrhosis ; Colitis; Crohns;  Hepatitis A; Hepatitis B; Hepatitis C Past Medical History Notes: cholecystitis , GERD Endocrine Medical History: Positive for: Type II Diabetes - last A1c - 13.3 Genitourinary Medical History: Past Medical History Notes: h/o nephrolithiasis , h/o renal cell carcinoma , CKD Stage 3 Musculoskeletal Medical History: Past Medical History Notes: arthritis Psychiatric Medical History: Past Medical History Notes: anxiety , depression Immunizations Pneumococcal Vaccine: Received Pneumococcal Vaccination: Yes Received Pneumococcal Vaccination On or After 60th Birthday: Yes Implantable Devices None Hospitalization / Surgery History Type of Hospitalization/Surgery appendectomy cholecystectomy laproscopic nephrectomy lapro lysis of lesions thorascopy lung biopsy bronchoscopy (L) heart cath with angio X3 endarcdectomy patch angioplasty left great toe amp. December 27, 2020 Family and Social History Cancer: Yes - Maternal Grandparents,Paternal Grandparents,Father,Mother; Diabetes: Yes - Mother,Father; Heart Disease: Yes - Maternal Grandparents,Mother,Father; Hereditary Spherocytosis: No; Hypertension: No; Kidney Disease: Yes - Maternal Grandparents; Lung Disease: Yes - Father; Seizures: No; Stroke: Yes - Paternal Grandparents,Maternal Grandparents,Mother; Thyroid Problems: No; Tuberculosis: No; Current every day smoker - 1/2 pack a day; Alcohol Use: Never; Drug Use: No History; Caffeine Use: Daily - coffee, tea; Financial Concerns: No; Food, Clothing or Shelter Needs: No; Support System Lacking: No; Transportation Concerns: No Electronic Signature(s) Signed: 04/28/2021 10:34:39 AM By: Kalman Shan DO Signed: 04/28/2021 5:11:49 PM By: Lorrin Jackson Entered By: Kalman Shan on 04/28/2021 10:26:48 -------------------------------------------------------------------------------- [place] Details Patient Name: Date of Service: [name] 04/28/2021 Medical Record Number:  [id number] Patient Account Number: [id number] Date of Birth/Sex: Treating RN: 01-14-1956 (65 y.o. [name] Primary Care Provider: Carolee Rota Other Clinician: Referring Provider: Treating Provider/Extender: Maryagnes Amos in Treatment: 7 Diagnosis Coding ICD-10 Codes Code Description [phone] Unspecified open wound, left foot, subsequent encounter E11.621 Type 2 diabetes mellitus with foot ulcer J44.9 Chronic obstructive pulmonary disease, unspecified M86.672 Other chronic osteomyelitis, left ankle and foot Facility Procedures CPT4 Code: [id number] Description: 99214 - WOUND CARE VISIT-LEV 4 EST PT Modifier: Quantity: 1 Physician Procedures : CPT4 Code Description Modifier [id number] 99214 - WC PHYS LEVEL 4 - EST PT ICD-10 Diagnosis Description M86.672 Other chronic osteomyelitis, left ankle and foot S91.302D Unspecified open wound, left foot, subsequent encounter E11.621 Type 2 diabetes  mellitus with foot ulcer J44.9 Chronic obstructive pulmonary disease, unspecified Quantity: 1 Electronic Signature(s) Signed: 04/28/2021 10:34:39 AM By: Kalman Shan DO Entered By: Kalman Shan on 04/28/2021 10:34:25

## 2021-04-29 DIAGNOSIS — G629 Polyneuropathy, unspecified: Secondary | ICD-10-CM | POA: Diagnosis not present

## 2021-04-29 DIAGNOSIS — E1140 Type 2 diabetes mellitus with diabetic neuropathy, unspecified: Secondary | ICD-10-CM | POA: Diagnosis not present

## 2021-04-29 DIAGNOSIS — J449 Chronic obstructive pulmonary disease, unspecified: Secondary | ICD-10-CM | POA: Diagnosis not present

## 2021-04-29 DIAGNOSIS — G8929 Other chronic pain: Secondary | ICD-10-CM | POA: Diagnosis not present

## 2021-04-29 LAB — CBC WITH DIFFERENTIAL/PLATELET
Absolute Monocytes: 557 cells/uL (ref 200–950)
Basophils Absolute: 63 cells/uL (ref 0–200)
Basophils Relative: 0.6 %
Eosinophils Absolute: 105 cells/uL (ref 15–500)
Eosinophils Relative: 1 %
HCT: 41 % (ref 38.5–50.0)
Hemoglobin: 13.4 g/dL (ref 13.2–17.1)
Lymphs Abs: 1628 cells/uL (ref 850–3900)
MCH: 27.7 pg (ref 27.0–33.0)
MCHC: 32.7 g/dL (ref 32.0–36.0)
MCV: 84.9 fL (ref 80.0–100.0)
MPV: 11.9 fL (ref 7.5–12.5)
Monocytes Relative: 5.3 %
Neutro Abs: 8148 cells/uL — ABNORMAL HIGH (ref 1500–7800)
Neutrophils Relative %: 77.6 %
Platelets: 238 10*3/uL (ref 140–400)
RBC: 4.83 10*6/uL (ref 4.20–5.80)
RDW: 13.2 % (ref 11.0–15.0)
Total Lymphocyte: 15.5 %
WBC: 10.5 10*3/uL (ref 3.8–10.8)

## 2021-04-29 LAB — HCV RNA,QUANTITATIVE REAL TIME PCR
HCV Quantitative Log: <1.18 Log IU/mL
HCV RNA, PCR, QN: <15 IU/mL

## 2021-04-29 LAB — COMPLETE METABOLIC PANEL WITH GFR
AG Ratio: 1.3 (calc) (ref 1.0–2.5)
ALT: 6 U/L — ABNORMAL LOW (ref 9–46)
AST: 9 U/L — ABNORMAL LOW (ref 10–35)
Albumin: 3.9 g/dL (ref 3.6–5.1)
Alkaline phosphatase (APISO): 89 U/L (ref 35–144)
BUN/Creatinine Ratio: 12 (calc) (ref 6–22)
BUN: 22 mg/dL (ref 7–25)
CO2: 24 mmol/L (ref 20–32)
Calcium: 8.4 mg/dL — ABNORMAL LOW (ref 8.6–10.3)
Chloride: 92 mmol/L — ABNORMAL LOW (ref 98–110)
Creat: 1.89 mg/dL — ABNORMAL HIGH (ref 0.70–1.35)
Globulin: 3 g/dL (calc) (ref 1.9–3.7)
Glucose, Bld: 547 mg/dL (ref 65–99)
Potassium: 4.4 mmol/L (ref 3.5–5.3)
Sodium: 130 mmol/L — ABNORMAL LOW (ref 135–146)
Total Bilirubin: 0.4 mg/dL (ref 0.2–1.2)
Total Protein: 6.9 g/dL (ref 6.1–8.1)
eGFR: 39 mL/min/{1.73_m2} — ABNORMAL LOW (ref >=60)

## 2021-04-29 LAB — SEDIMENTATION RATE: Sed Rate: 45 mm/h — ABNORMAL HIGH (ref 0–20)

## 2021-04-29 LAB — HEPATITIS A ANTIBODY, TOTAL: Hepatitis A AB,Total: NONREACTIVE

## 2021-04-29 LAB — HEPATITIS B SURFACE ANTIBODY, QUANTITATIVE: Hep B S AB Quant (Post): 8 m[IU]/mL — ABNORMAL LOW (ref >=10)

## 2021-04-29 LAB — C-REACTIVE PROTEIN: CRP: 22.8 mg/L — ABNORMAL HIGH (ref <8.0)

## 2021-04-29 LAB — HEPATITIS C ANTIBODY
Hepatitis C Ab: REACTIVE — AB
SIGNAL TO CUT-OFF: 1.1 — ABNORMAL HIGH (ref <1.00)

## 2021-04-29 LAB — HEPATITIS B SURFACE ANTIGEN: Hepatitis B Surface Ag: NONREACTIVE

## 2021-04-30 ENCOUNTER — Telehealth: Payer: Self-pay | Admitting: Urology

## 2021-04-30 ENCOUNTER — Other Ambulatory Visit: Payer: Self-pay

## 2021-04-30 ENCOUNTER — Ambulatory Visit (INDEPENDENT_AMBULATORY_CARE_PROVIDER_SITE_OTHER): Payer: Medicare Other | Admitting: Podiatry

## 2021-04-30 DIAGNOSIS — E1142 Type 2 diabetes mellitus with diabetic polyneuropathy: Secondary | ICD-10-CM

## 2021-04-30 DIAGNOSIS — M86172 Other acute osteomyelitis, left ankle and foot: Secondary | ICD-10-CM | POA: Diagnosis not present

## 2021-04-30 DIAGNOSIS — Z01818 Encounter for other preprocedural examination: Secondary | ICD-10-CM | POA: Diagnosis not present

## 2021-04-30 NOTE — Progress Notes (Signed)
Kevin Corin W. ([id number]) , Visit Report for 04/28/2021 Arrival Information Details Patient Name: Date of Service: [name], Kevin W. 04/28/2021 10:00 A M Medical Record Number: [id number] Patient Account Number: [id number] Date of Birth/Sex: Treating RN: 11/04/1955 (64 y.o. M) Deaton, Bobbi Primary Care Provider: Daniel, Janice Other Clinician: Referring Provider: Treating Provider/Extender: Hoffman, Jessica Daniel, Janice Weeks in Treatment: 7 Visit Information History Since Last Visit All ordered tests and consults were completed: Yes Patient Arrived: Ambulatory Added or deleted any medications: No Arrival Time: 09:40 Any new allergies or adverse reactions: No Accompanied By: self Had a fall or experienced change in No Transfer Assistance: None activities of daily living that may affect Patient Identification Verified: Yes risk of falls: Secondary Verification Process Completed: Yes Signs or symptoms of abuse/neglect since last visito No Patient Requires Transmission-Based Precautions: No Hospitalized since last visit: No Patient Has Alerts: Yes Implantable device outside of the clinic excluding No Patient Alerts: ABI's: R: 0.82 L:0.76 cellular tissue based products placed in the center since last visit: Has Dressing in Place as Prescribed: Yes Pain Present Now: No Notes Per patient seen ID and recommends transmet to left foot. Per patient no abx prescribed. Electronic Signature(s) Signed: 04/28/2021 4:50:46 PM By: Deaton, Bobbi RN, BSN Entered By: Deaton, Bobbi on 04/28/2021 09:44:34 -------------------------------------------------------------------------------- Clinic Level of Care Assessment Details Patient Name: Date of Service: [name], Kevin W. 04/28/2021 10:00 A M Medical Record Number: [id number] Patient Account Number: [id number] Date of Birth/Sex: Treating RN: 06/17/1956 (64 y.o. M) Deaton, Bobbi Primary Care Provider: Daniel, Janice Other  Clinician: Referring Provider: Treating Provider/Extender: Hoffman, Jessica Daniel, Janice Weeks in Treatment: 7 Clinic Level of Care Assessment Items TOOL 4 Quantity Score X- 1 0 Use when only an EandM is performed on FOLLOW-UP visit ASSESSMENTS - Nursing Assessment / Reassessment X- 1 10 Reassessment of Co-morbidities (includes updates in patient status) X- 1 5 Reassessment of Adherence to Treatment Plan ASSESSMENTS - Wound and Skin A ssessment / Reassessment [] - 0 Simple Wound Assessment / Reassessment - one wound X- 2 5 Complex Wound Assessment / Reassessment - multiple wounds X- 1 10 Dermatologic / Skin Assessment (not related to wound area) ASSESSMENTS - Focused Assessment X- 1 5 Circumferential Edema Measurements - multi extremities X- 1 10 Nutritional Assessment / Counseling / Intervention [] - 0 Lower Extremity Assessment (monofilament, tuning fork, pulses) [] - 0 Peripheral Arterial Disease Assessment (using hand held doppler) ASSESSMENTS - Ostomy and/or Continence Assessment and Care [] - 0 Incontinence Assessment and Management [] - 0 Ostomy Care Assessment and Management (repouching, etc.) PROCESS - Coordination of Care [] - 0 Simple Patient / Family Education for ongoing care X- 1 20 Complex (extensive) Patient / Family Education for ongoing care X- 1 10 Staff obtains Consents, Records, T Results / Process Orders est [] - 0 Staff telephones HHA, Nursing Homes / Clarify orders / etc [] - 0 Routine Transfer to another Facility (non-emergent condition) [] - 0 Routine Hospital Admission (non-emergent condition) [] - 0 New Admissions / Insurance Authorizations / Ordering NPWT Apligraf, etc. , [] - 0 Emergency Hospital Admission (emergent condition) [] - 0 Simple Discharge Coordination X- 1 15 Complex (extensive) Discharge Coordination PROCESS - Special Needs [] - 0 Pediatric / Minor Patient Management [] - 0 Isolation Patient Management [] -  0 Hearing / Language / Visual special needs [] - 0 Assessment of Community assistance (transportation, D/C planning, etc.) [] - 0 Additional assistance / Altered mentation [] - 0 Support Surface(s) Assessment (  bed, cushion, seat, etc.) INTERVENTIONS - Wound Cleansing / Measurement [] - 0 Simple Wound Cleansing - one wound X- 2 5 Complex Wound Cleansing - multiple wounds X- 1 5 Wound Imaging (photographs - any number of wounds) [] - 0 Wound Tracing (instead of photographs) [] - 0 Simple Wound Measurement - one wound X- 2 5 Complex Wound Measurement - multiple wounds INTERVENTIONS - Wound Dressings X - Small Wound Dressing one or multiple wounds 1 10 [] - 0 Medium Wound Dressing one or multiple wounds [] - 0 Large Wound Dressing one or multiple wounds [] - 0 Application of Medications - topical [] - 0 Application of Medications - injection INTERVENTIONS - Miscellaneous [] - 0 External ear exam X- 1 5 Specimen Collection (cultures, biopsies, blood, body fluids, etc.) [] - 0 Specimen(s) / Culture(s) sent or taken to Lab for analysis [] - 0 Patient Transfer (multiple staff / Hoyer Lift / Similar devices) [] - 0 Simple Staple / Suture removal (25 or less) [] - 0 Complex Staple / Suture removal (26 or more) [] - 0 Hypo / Hyperglycemic Management (close monitor of Blood Glucose) [] - 0 Ankle / Brachial Index (ABI) - do not check if billed separately X- 1 5 Vital Signs Has the patient been seen at the hospital within the last three years: Yes Total Score: 140 Level Of Care: New/Established - Level 4 Electronic Signature(s) Signed: 04/28/2021 4:50:46 PM By: Deaton, Bobbi RN, BSN Entered By: Deaton, Bobbi on 04/28/2021 10:05:53 -------------------------------------------------------------------------------- Encounter Discharge Information Details Patient Name: Date of Service: [name], Kevin W. 04/28/2021 10:00 A M Medical Record Number: [id number] Patient Account  Number: [id number] Date of Birth/Sex: Treating RN: 12/07/1955 (64 y.o. M) Deaton, Bobbi Primary Care Provider: Daniel, Janice Other Clinician: Referring Provider: Treating Provider/Extender: Hoffman, Jessica Daniel, Janice Weeks in Treatment: 7 Encounter Discharge Information Items Discharge Condition: Stable Ambulatory Status: Ambulatory Discharge Destination: Home Transportation: Private Auto Accompanied By: self Schedule Follow-up Appointment: Yes Clinical Summary of Care: Electronic Signature(s) Signed: 04/28/2021 4:50:46 PM By: Deaton, Bobbi RN, BSN Entered By: Deaton, Bobbi on 04/28/2021 10:07:11 -------------------------------------------------------------------------------- Lower Extremity Assessment Details Patient Name: Date of Service: [name], Kevin W. 04/28/2021 10:00 A M Medical Record Number: [id number] Patient Account Number: [id number] Date of Birth/Sex: Treating RN: 10/24/1955 (64 y.o. M) Deaton, Bobbi Primary Care Provider: Daniel, Janice Other Clinician: Referring Provider: Treating Provider/Extender: Hoffman, Jessica Daniel, Janice Weeks in Treatment: 7 Edema Assessment Assessed: [Left: Yes] [Right: No] Edema: [Left: Ye] [Right: s] Calf Left: Right: Point of Measurement: 37 cm From Medial Instep 36 cm Ankle Left: Right: Point of Measurement: 8 cm From Medial Instep 23.5 cm Vascular Assessment Pulses: Dorsalis Pedis Palpable: [Left:Yes] Electronic Signature(s) Signed: 04/28/2021 4:50:46 PM By: Deaton, Bobbi RN, BSN Entered By: Deaton, Bobbi on 04/28/2021 09:47:02 -------------------------------------------------------------------------------- Multi Wound Chart Details Patient Name: Date of Service: [name], Kevin W. 04/28/2021 10:00 A M Medical Record Number: [id number] Patient Account Number: [id number] Date of Birth/Sex: Treating RN: 01/07/1956 (64 y.o. M) Barnhart, Jodi Primary Care Provider: Daniel, Janice Other Clinician: Referring  Provider: Treating Provider/Extender: Hoffman, Jessica Daniel, Janice Weeks in Treatment: 7 Vital Signs Height(in): Capillary Blood Glucose(mg/dl): 310 Weight(lbs): Pulse(bpm): 101 Body Mass Index(BMI): Blood Pressure(mmHg): 179/94 Temperature(°F): 98.5 Respiratory Rate(breaths/min): 20 Photos: [N/A:N/A] Left Amputation Site - Toe Left T Second oe N/A Wound Location: Surgical Injury Gradually Appeared N/A Wounding Event: Open Surgical Wound Diabetic Wound/Ulcer of the Lower N/A Primary Etiology: Extremity Chronic Obstructive Pulmonary Chronic Obstructive Pulmonary N/A Comorbid History: Disease (COPD), Sleep Apnea,   Disease (COPD), Sleep Apnea, Coronary Artery Disease, Coronary Artery Disease, Hypertension, Type II Diabetes Hypertension, Type II Diabetes 12/27/2020 04/15/2021 N/A Date Acquired: 7 1 N/A Weeks of Treatment: Open Open N/A Wound Status: 0.2x0.2x0.6 0.3x0.3x0.5 N/A Measurements L x W x D (cm) 0.031 0.071 N/A A (cm) : rea 0.019 0.035 N/A Volume (cm) : 95.60% 24.50% N/A % Reduction in A rea: 97.90% -84.20% N/A % Reduction in Volume: 12 Starting Position 1 (o'clock): 12 Ending Position 1 (o'clock): 0.5 Maximum Distance 1 (cm): No Yes N/A Undermining: Full Thickness With Exposed Support Grade 1 N/A Classification: Structures Small Medium N/A Exudate Amount: Serosanguineous Serosanguineous N/A Exudate Type: red, brown red, brown N/A Exudate Color: Well defined, not attached Thickened N/A Wound Margin: Large (67-100%) Large (67-100%) N/A Granulation Amount: Red, Pink Pink N/A Granulation Quality: None Present (0%) None Present (0%) N/A Necrotic Amount: Fat Layer (Subcutaneous Tissue): Yes Fat Layer (Subcutaneous Tissue): Yes N/A Exposed Structures: Fascia: No Fascia: No Tendon: No Tendon: No Muscle: No Muscle: No Joint: No Joint: No Bone: No Bone: No Small (1-33%) None N/A Epithelialization: callous to periwound. callous to  periwound. N/A Assessment Notes: Treatment Notes Wound #5 (Amputation Site - Toe) Wound Laterality: Left Cleanser Normal Saline Discharge Instruction: Cleanse the wound with Normal Saline prior to applying a clean dressing using gauze sponges, not tissue or cotton balls. Soap and Water Discharge Instruction: May shower and wash wound with dial antibacterial soap and water prior to dressing change. Peri-Wound Care Topical Primary Dressing KerraCel Ag Gelling Fiber Dressing, 4x5 in (silver alginate) Discharge Instruction: Apply silver alginate to wound bed as instructed Secondary Dressing Woven Gauze Sponge, Non-Sterile 4x4 in Discharge Instruction: Apply over primary dressing as directed. Secured With Kerlix Roll Sterile, 4.5x3.1 (in/yd) Discharge Instruction: Secure with Kerlix as directed. 3M Medipore H Soft Cloth Surgical T ape, 2x2 (in/yd) Discharge Instruction: Secure dressing with tape as directed. Compression Wrap Compression Stockings Add-Ons Wound #6 (Toe Second) Wound Laterality: Left Cleanser Normal Saline Discharge Instruction: Cleanse the wound with Normal Saline prior to applying a clean dressing using gauze sponges, not tissue or cotton balls. Peri-Wound Care Topical Primary Dressing KerraCel Ag Gelling Fiber Dressing, 2x2 in (silver alginate) Discharge Instruction: Apply silver alginate to wound bed as instructed Secondary Dressing Woven Gauze Sponges 2x2 in Discharge Instruction: Apply over primary dressing as directed. Optifoam Non-Adhesive Dressing, 4x4 in Discharge Instruction: Apply over primary dressing to cushion tip of toe Secured With Conforming Stretch Gauze Bandage, Sterile 2x75 (in/in) Discharge Instruction: Secure with stretch gauze as directed. 3M Medipore H Soft Cloth Surgical T ape, 2x2 (in/yd) Discharge Instruction: Secure dressing with tape as directed. Compression Wrap Compression Stockings Add-Ons Electronic Signature(s) Signed:  04/28/2021 10:34:39 AM By: Hoffman, Jessica DO Signed: 04/28/2021 5:11:49 PM By: Barnhart, Jodi Entered By: Hoffman, Jessica on 04/28/2021 10:25:29 -------------------------------------------------------------------------------- Multi-Disciplinary Care Plan Details Patient Name: Date of Service: [name], Kevin W. 04/28/2021 10:00 A M Medical Record Number: [id number] Patient Account Number: [id number] Date of Birth/Sex: Treating RN: 04/12/1956 (64 y.o. M) Deaton, Bobbi Primary Care Provider: Daniel, Janice Other Clinician: Referring Provider: Treating Provider/Extender: Hoffman, Jessica Daniel, Janice Weeks in Treatment: 7 Multidisciplinary Care Plan reviewed with physician Active Inactive Wound/Skin Impairment Nursing Diagnoses: Impaired tissue integrity Knowledge deficit related to ulceration/compromised skin integrity Goals: Patient will have a decrease in wound volume by X% from date: (specify in notes) Date Initiated: 03/06/2021 Date Inactivated: 04/15/2021 Target Resolution Date: 03/26/2021 Goal Status: Met Patient/caregiver will verbalize understanding of skin care regimen Date Initiated: 03/06/2021 Target Resolution   Date: 05/19/2021 Goal Status: Active Ulcer/skin breakdown will have a volume reduction of 30% by week 4 Date Initiated: 03/06/2021 Date Inactivated: 04/15/2021 Target Resolution Date: 03/26/2021 Goal Status: Unmet Unmet Reason: probable osteomyelitis Ulcer/skin breakdown will have a volume reduction of 50% by week 8 Date Initiated: 04/15/2021 Date Inactivated: 04/21/2021 Target Resolution Date: 04/22/2021 Goal Status: Met Ulcer/skin breakdown will have a volume reduction of 80% by week 12 Date Initiated: 04/21/2021 Target Resolution Date: 05/19/2021 Goal Status: Active Interventions: Assess patient/caregiver ability to obtain necessary supplies Assess patient/caregiver ability to perform ulcer/skin care regimen upon admission and as needed Assess ulceration(s)  every visit Notes: Electronic Signature(s) Signed: 04/28/2021 4:50:46 PM By: Deon Pilling RN, BSN Entered By: Deon Pilling on 04/28/2021 09:47:30 -------------------------------------------------------------------------------- Pain Assessment Details Patient Name: Date of Service: Kevin Brookes. 04/28/2021 10:00 A M Medical Record Number: [id number] Patient Account Number: [id number] Date of Birth/Sex: Treating RN: 1955-10-14 (65 y.o. [name] Primary Care Reeda Soohoo: Carolee Rota Other Clinician: Referring Loranda Mastel: Treating Marilena Trevathan/Extender: Maryagnes Amos in Treatment: 7 Active Problems Location of Pain Severity and Description of Pain Patient Has Paino No Site Locations Rate the pain. Current Pain Level: 0 Pain Management and Medication Current Pain Management: Medication: No Cold Application: No Rest: No Massage: No Activity: No T.E.N.S.: No Heat Application: No Leg drop or elevation: No Is the Current Pain Management Adequate: Adequate How does your wound impact your activities of daily livingo Sleep: No Bathing: No Appetite: No Relationship With Others: No Bladder Continence: No Emotions: No Bowel Continence: No Work: No Toileting: No Drive: No Dressing: No Hobbies: No Engineer, maintenance) Signed: 04/28/2021 4:50:46 PM By: Deon Pilling RN, BSN Entered By: Deon Pilling on 04/28/2021 09:46:50 -------------------------------------------------------------------------------- Patient/Caregiver Education Details Patient Name: Date of Service: [name], Kevin W. 10/10/2022andnbsp10:00 A M Medical Record Number: [id number] Patient Account Number: [id number] Date of Birth/Gender: Treating RN: [birth year] (65 y.o. [name] Primary Care Physician: Carolee Rota Other Clinician: Referring Physician: Treating Physician/Extender: Maryagnes Amos in Treatment: 7 Education  Assessment Education Provided To: Patient Education Topics Provided Wound/Skin Impairment: Handouts: Skin Care Do's and Dont's Methods: Explain/Verbal Responses: Reinforcements needed Electronic Signature(s) Signed: 04/28/2021 4:50:46 PM By: Deon Pilling RN, BSN Entered By: Deon Pilling on 04/28/2021 09:47:47 -------------------------------------------------------------------------------- Wound Assessment Details Patient Name: Date of Service: Kevin Brookes. 04/28/2021 10:00 A M Medical Record Number: [id number] Patient Account Number: [id number] Date of Birth/Sex: Treating RN: 12/04/1955 (65 y.o. [name], [name] Primary Care Colisha Redler: Carolee Rota Other Clinician: Referring Kaydra Borgen: Treating Verl Kitson/Extender: Maryagnes Amos in Treatment: 7 Wound Status Wound Number: 5 Primary Open Surgical Wound Etiology: Wound Location: Left Amputation Site - Toe Wound Open Wounding Event: Surgical Injury Status: Date Acquired: 12/27/2020 Comorbid Chronic Obstructive Pulmonary Disease (COPD), Sleep Apnea, Weeks Of Treatment: 7 History: Coronary Artery Disease, Hypertension, Type II Diabetes Clustered Wound: No Photos Wound Measurements Length: (cm) 0.2 Width: (cm) 0.2 Depth: (cm) 0.6 Area: (cm) 0.031 Volume: (cm) 0.019 % Reduction in Area: 95.6% % Reduction in Volume: 97.9% Epithelialization: Small (1-33%) Tunneling: No Undermining: No Wound Description Classification: Full Thickness With Exposed Support Structures Wound Margin: Well defined, not attached Exudate Amount: Small Exudate Type: Serosanguineous Exudate Color: red, brown Foul Odor After Cleansing: No Slough/Fibrino Yes Wound Bed Granulation Amount: Large (67-100%) Exposed Structure Granulation Quality: Red, Pink Fascia Exposed: No Necrotic Amount: None Present (0%) Fat Layer (Subcutaneous Tissue) Exposed: Yes Tendon Exposed: No Muscle Exposed: No Joint Exposed: No Bone  Exposed: No Assessment Notes  callous to periwound. Treatment Notes Wound #5 (Amputation Site - Toe) Wound Laterality: Left Cleanser Normal Saline Discharge Instruction: Cleanse the wound with Normal Saline prior to applying a clean dressing using gauze sponges, not tissue or cotton balls. Soap and Water Discharge Instruction: May shower and wash wound with dial antibacterial soap and water prior to dressing change. Peri-Wound Care Topical Primary Dressing KerraCel Ag Gelling Fiber Dressing, 4x5 in (silver alginate) Discharge Instruction: Apply silver alginate to wound bed as instructed Secondary Dressing Woven Gauze Sponge, Non-Sterile 4x4 in Discharge Instruction: Apply over primary dressing as directed. Secured With Kerlix Roll Sterile, 4.5x3.1 (in/yd) Discharge Instruction: Secure with Kerlix as directed. 3M Medipore H Soft Cloth Surgical T ape, 2x2 (in/yd) Discharge Instruction: Secure dressing with tape as directed. Compression Wrap Compression Stockings Add-Ons Electronic Signature(s) Signed: 04/28/2021 4:50:46 PM By: Deaton, Bobbi RN, BSN Signed: 04/30/2021 1:46:44 PM By: Dawkins, Destiny Entered By: Dawkins, Destiny on 04/28/2021 09:44:07 -------------------------------------------------------------------------------- Wound Assessment Details Patient Name: Date of Service: [name], Kevin W. 04/28/2021 10:00 A M Medical Record Number: [id number] Patient Account Number: [id number] Date of Birth/Sex: Treating RN: 06/25/1956 (64 y.o. M) Deaton, Bobbi Primary Care Provider: Daniel, Janice Other Clinician: Referring Provider: Treating Provider/Extender: Hoffman, Jessica Daniel, Janice Weeks in Treatment: 7 Wound Status Wound Number: 6 Primary Diabetic Wound/Ulcer of the Lower Extremity Etiology: Wound Location: Left T Second oe Wound Open Wounding Event: Gradually Appeared Status: Date Acquired: 04/15/2021 Comorbid Chronic Obstructive Pulmonary Disease (COPD),  Sleep Apnea, Weeks Of Treatment: 1 History: Coronary Artery Disease, Hypertension, Type II Diabetes Clustered Wound: No Photos Wound Measurements Length: (cm) 0.3 Width: (cm) 0.3 Depth: (cm) 0.5 Area: (cm) 0.071 Volume: (cm) 0.035 % Reduction in Area: 24.5% % Reduction in Volume: -84.2% Epithelialization: None Tunneling: No Undermining: Yes Starting Position (o'clock): 12 Ending Position (o'clock): 12 Maximum Distance: (cm) 0.5 Wound Description Classification: Grade 1 Wound Margin: Thickened Exudate Amount: Medium Exudate Type: Serosanguineous Exudate Color: red, brown Foul Odor After Cleansing: No Slough/Fibrino No Wound Bed Granulation Amount: Large (67-100%) Exposed Structure Granulation Quality: Pink Fascia Exposed: No Necrotic Amount: None Present (0%) Fat Layer (Subcutaneous Tissue) Exposed: Yes Tendon Exposed: No Muscle Exposed: No Joint Exposed: No Bone Exposed: No Assessment Notes callous to periwound. Treatment Notes Wound #6 (Toe Second) Wound Laterality: Left Cleanser Normal Saline Discharge Instruction: Cleanse the wound with Normal Saline prior to applying a clean dressing using gauze sponges, not tissue or cotton balls. Peri-Wound Care Topical Primary Dressing KerraCel Ag Gelling Fiber Dressing, 2x2 in (silver alginate) Discharge Instruction: Apply silver alginate to wound bed as instructed Secondary Dressing Woven Gauze Sponges 2x2 in Discharge Instruction: Apply over primary dressing as directed. Optifoam Non-Adhesive Dressing, 4x4 in Discharge Instruction: Apply over primary dressing to cushion tip of toe Secured With Conforming Stretch Gauze Bandage, Sterile 2x75 (in/in) Discharge Instruction: Secure with stretch gauze as directed. 3M Medipore H Soft Cloth Surgical T ape, 2x2 (in/yd) Discharge Instruction: Secure dressing with tape as directed. Compression Wrap Compression Stockings Add-Ons Electronic Signature(s) Signed: 04/28/2021  4:50:46 PM By: Deaton, Bobbi RN, BSN Signed: 04/30/2021 1:46:44 PM By: Dawkins, Destiny Entered By: Dawkins, Destiny on 04/28/2021 09:44:41 -------------------------------------------------------------------------------- Vitals Details Patient Name: Date of Service: [name], Kevin W. 04/28/2021 10:00 A M Medical Record Number: [id number] Patient Account Number: [id number] Date of Birth/Sex: Treating RN: 09/24/1955 (64 y.o. M) Deaton, Bobbi Primary Care Provider: Daniel, Janice Other Clinician: Referring Provider: Treating Provider/Extender: Hoffman, Jessica Daniel, Janice Weeks in Treatment: 7 Vital Signs Time Taken: 09:40 Temperature (°F): 98.5 Pulse (bpm): 101   Respiratory Rate (breaths/min): 20 Blood Pressure (mmHg): 179/94 Capillary Blood Glucose (mg/dl): 310 Reference Range: 80 - 120 mg / dl Notes Per patient sees PCP this afternoon at 1430 related to blood glucose. Discussed at length with patient the concern of a regular pepsi bottle in his hand in relation to elevated blood glucose and diabetic ulcers. Electronic Signature(s) Signed: 04/28/2021 4:50:46 PM By: Deaton, Bobbi RN, BSN Entered By: Deaton, Bobbi on 04/28/2021 09:46:39 

## 2021-04-30 NOTE — Telephone Encounter (Signed)
DOS - 05/05/21  METATARSAL AMPUTATION LEFT --- [id number]  [facility] EFFECTIVE DATE - 10/18/20  PLAN DEDUCTIBLE - $0.00  OUT OF POCKET - $7,550.00 W/ $7,449.66 REMAINING COINSURANCE - 20% COPAY - $0.00   PER [employer] WEB SITE FOR CPT CODE 02585 Notification or Prior Authorization is not required for the requested services  Decision ID #:[id number]

## 2021-05-01 ENCOUNTER — Telehealth: Payer: Self-pay | Admitting: *Deleted

## 2021-05-01 NOTE — Telephone Encounter (Signed)
Patient is calling for status on a form from [facility], to extend his home health hours. I explained to call them , give them our fax number and have them fax over to be completed or call us back and give their number so that I may contact them again to have them send form.He said that he would.

## 2021-05-04 LAB — AEROBIC/ANAEROBIC CULTURE W GRAM STAIN (SURGICAL/DEEP WOUND)

## 2021-05-05 ENCOUNTER — Encounter: Payer: Self-pay | Admitting: Podiatry

## 2021-05-05 DIAGNOSIS — M25572 Pain in left ankle and joints of left foot: Secondary | ICD-10-CM | POA: Diagnosis not present

## 2021-05-05 DIAGNOSIS — M869 Osteomyelitis, unspecified: Secondary | ICD-10-CM | POA: Diagnosis not present

## 2021-05-05 DIAGNOSIS — M86472 Chronic osteomyelitis with draining sinus, left ankle and foot: Secondary | ICD-10-CM | POA: Diagnosis not present

## 2021-05-05 DIAGNOSIS — M86672 Other chronic osteomyelitis, left ankle and foot: Secondary | ICD-10-CM | POA: Diagnosis not present

## 2021-05-05 NOTE — Progress Notes (Addendum)
Subjective:  Patient ID: [name], male    DOB: [birth year],  MRN: [id number]  Chief Complaint  Patient presents with   Wound Check    Wound check     65 y.o. male presents for wound care.  Patient presents with left first MPJ wound status post partial first ray amputation.  He has new findings on MRI consistent with osteomyelitis of the second metatarsal.  At this time he has lost the integrity of the first and second ray and patient may need a transmetatarsal amputation.  Patient was sent back to me for surgical intervention.  Review of Systems: Negative except as noted in the HPI. Denies N/V/F/Ch.  Past Medical History:  Diagnosis Date   Anxiety    Arthritis    Cholecystitis, acute 12/20/2013   Lap chole 6/5   Cocaine abuse ([name])    COPD (chronic obstructive pulmonary disease) ([name])    Coronary atherosclerosis of native coronary artery    a. 03/09/2013 Cath/PCI: LM nl, LAD: 50p, 60m (2.5x16 promus DES), LCX nl, OM1 min irregs, LPL/LPDA diff dzs, RCA nondom, mod diff dzs, EF 55%.   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 ([place]) 04/24/2021   History of nephrolithiasis    History of pneumonia    History of stroke    Right MCA distribution, residual left-sided weakness   Hyperlipidemia    Noncompliance    Osteomyelitis of second toe of left foot ([place]) 04/24/2021   Renal cell carcinoma ([name])    Status post radical right nephrectomy August 2015   Sleep apnea    On CPAP, 4L O2 no cpap at home yet   Type 2 diabetes mellitus ([place]) 2011    Current Outpatient Medications:    acetaminophen (TYLENOL) 325 MG tablet, Take 650 mg by mouth as needed (pain)., Disp: , Rfl:    ALPRAZolam (XANAX) 0.5 MG tablet, Take 1 tablet (0.5 mg total) by mouth 3 (three) times daily as needed for anxiety. (Patient not taking: Reported on 04/24/2021), Disp: , Rfl: 0    ALPRAZolam (XANAX) 1 MG tablet, Take 1 mg by mouth 3 (three) times daily as needed., Disp: , Rfl:    atorvastatin (LIPITOR) 20 MG tablet, Take 20 mg by mouth at bedtime., Disp: , Rfl: 1   Budeson-Glycopyrrol-Formoterol (BREZTRI AEROSPHERE) 160-9-4.8 MCG/ACT AERO, Inhale 2 puffs into the lungs in the morning and at bedtime., Disp: 10.7 g, Rfl: 5   budesonide (PULMICORT) 1 MG/2ML nebulizer solution, Take 1 mg by nebulization as needed (shortness of breath)., Disp: , Rfl:    clopidogrel (PLAVIX) 75 MG tablet, TAKE ONE TABLET BY MOUTH EVERY DAY, Disp: 90 tablet, Rfl: 2   diltiazem (CARDIZEM CD) 180 MG 24 hr capsule, TAKE ONE CAPSULE BY MOUTH EVERY DAY, Disp: 90 capsule, Rfl: 2   diphenhydrAMINE (BENADRYL) 25 mg capsule, Take 25 mg by mouth every 6 (six) hours as needed for itching (take with percocet)., Disp: , Rfl:    docusate sodium (COLACE) 100 MG capsule, Take 200 mg by mouth at bedtime. , Disp: , Rfl:    esomeprazole (NEXIUM) 40 MG capsule, Take 40 mg by mouth at bedtime. Pt only take as needed, Disp: , Rfl:    FLUoxetine (PROZAC) 20 MG capsule, Take by mouth., Disp: , Rfl:    FLUoxetine (PROZAC) 40 MG capsule, Take 40 mg  by mouth daily. Take with 10mg  capsule for a total of 50mg  daily., Disp: , Rfl:    formoterol (PERFOROMIST) 20 MCG/2ML nebulizer solution, Take 20 mcg by nebulization as needed (shortness of breath)., Disp: , Rfl:    gabapentin (NEURONTIN) 300 MG capsule, Take 900 mg by mouth 3 (three) times daily., Disp: , Rfl:    glucose blood (TRUE METRIX BLOOD GLUCOSE TEST) test strip, , Disp: , Rfl:    insulin regular human CONCENTRATED (HUMULIN R) 500 UNIT/ML injection, Inject 90 Units into the skin 3 (three) times daily with meals., Disp: , Rfl:    isosorbide mononitrate (IMDUR) 60 MG 24 hr tablet, Take 1 tablet (60 mg total) by mouth daily., Disp: , Rfl:    linagliptin (TRADJENTA) 5 MG TABS tablet, Take 5 mg by mouth daily., Disp: , Rfl:    nitroGLYCERIN (NITROSTAT) 0.4 MG SL tablet,  PLACE ONE tablet UNDER THE TONGUE every FIVE minutes as needed FOR CHEST PAIN. IF 3 TABLETS ARE NECESSARY CALL 911, Disp: 25 tablet, Rfl: 3   oxybutynin (DITROPAN-XL) 10 MG 24 hr tablet, Take 10 mg by mouth daily., Disp: , Rfl: 11   potassium chloride SA (K-DUR,KLOR-CON) 20 MEQ tablet, Take 20 mEq by mouth 2 (two) times daily. , Disp: , Rfl:    pregabalin (LYRICA) 25 MG capsule, Take 25 mg by mouth 2 (two) times daily., Disp: , Rfl:    Probiotic Product (PROBIOTIC PO), Take 1 capsule by mouth daily., Disp: , Rfl:    sucralfate (CARAFATE) 1 g tablet, TAKE ONE TABLET BY MOUTH TWICE DAILY ON an empty stomach (Patient not taking: No sig reported), Disp: , Rfl:    tamsulosin (FLOMAX) 0.4 MG CAPS capsule, Take 0.4 mg by mouth daily. , Disp: , Rfl:    torsemide (DEMADEX) 20 MG tablet, TAKE THREE TABLETS BY MOUTH TWICE DAILY, Disp: 540 tablet, Rfl: 0   zolpidem (AMBIEN) 5 MG tablet, Take 5 mg by mouth at bedtime. For insomnia., Disp: , Rfl:   Social History   Tobacco Use  Smoking Status Some Days   Packs/day: 1.00   Years: 41.00   Pack years: 41.00   Types: Cigarettes   Start date: 05/03/1972  Smokeless Tobacco Never  Tobacco Comments   Smoking 5cigs per day as of 02/26/21    Allergies  Allergen Reactions   Lisinopril Other (See Comments)    Other reaction(s): worsening kidney function   Oxycodone Itching and Nausea Only    Pt is able to tolerate if taken with benadryl   Objective:  There were no vitals filed for this visit. There is no height or weight on file to calculate BMI. Constitutional Well developed. Well nourished.  Vascular Dorsalis pedis pulses palpable bilaterally. Posterior tibial pulses palpable bilaterally. Capillary refill normal to all digits.  No cyanosis or clubbing noted. Pedal hair growth normal.  Neurologic Normal speech. Oriented to person, place, and time. Protective sensation absent  Dermatologic Wound Location: Left first MPJ wound probing down to deep  tissue with probe down to the second metatarsal.  No purulent drainage noted no cellulitis noted.  Orthopedic: No pain to palpation either foot.   Radiographs: Postsurgical changes of great toe amputation. There is bone marrow edema within the distal aspect of the residual first metatarsal, which could be reactive marrow changes or early osteomyelitis. Extensive soft tissue swelling at the resection site without well-defined/drainable collection.   Bony edema and mildly low T1 signal within the second metatarsal head, new from prior MRI, which  could be reactive marrow change or early osteomyelitis.   Acute, minimally displaced fracture of the base of the fifth metatarsal, new from the prior exam. Assessment:   1. Preoperative examination   2. Osteomyelitis of foot, left, acute ([place])       Plan:  Patient was evaluated and treated and all questions answered.  Ulcer left first MPJ wound status post partial first ray amputation -Patient was referred back to me given that the most recent MRI showed osteomyelitis of the second metatarsal and great.  Given that patient is experiencing osteomyelitic changes of the second metatarsal I believe patient will benefit from a transmetatarsal amputation from functional standpoint.  Given that he is lost the integrity of the first ray and now the second ray he would not benefit from just second metatarsal amputation as the risk of redeveloping ulcerations are much greater now.  At this time I discussed with the patient in extensive detail that he will benefit from a transmetatarsal amputation.  Patient agrees with the plan would like to proceed with that.  This may even have to be staged as patient may need Achilles tendon lengthening as part of a staged procedure in the future -He will need to be nonweightbearing to the left lower extremity in a boot.  I discussed with the patient he states understanding -Informed surgical risk consent was reviewed and  read aloud to the patient.  I reviewed the films.  I have discussed my findings with the patient in great detail.  I have discussed all risks including but not limited to infection, stiffness, scarring, limp, disability, deformity, damage to blood vessels and nerves, numbness, poor healing, need for braces, arthritis, chronic pain, amputation, death.  All benefits and realistic expectations discussed in great detail.  I have made no promises as to the outcome.  I have provided realistic expectations.  I have offered the patient a 2nd opinion, which they have declined and assured me they preferred to proceed despite the risks -Given that his A1c is 11.7 he is a high risk of major amputation.  I discussed this with the patient in extensive detail that transmetatarsal amputation may not heal given his sugars are very uncontrolled.  I discussed the importance of controlling the sugar.  He states understanding.   No follow-ups on file.

## 2021-05-06 ENCOUNTER — Encounter (HOSPITAL_BASED_OUTPATIENT_CLINIC_OR_DEPARTMENT_OTHER): Payer: Medicare Other | Admitting: Internal Medicine

## 2021-05-07 ENCOUNTER — Other Ambulatory Visit: Payer: Self-pay | Admitting: Cardiology

## 2021-05-08 ENCOUNTER — Telehealth: Payer: Self-pay | Admitting: *Deleted

## 2021-05-08 ENCOUNTER — Ambulatory Visit (INDEPENDENT_AMBULATORY_CARE_PROVIDER_SITE_OTHER): Payer: Medicare Other

## 2021-05-08 ENCOUNTER — Other Ambulatory Visit: Payer: Self-pay

## 2021-05-08 DIAGNOSIS — E1142 Type 2 diabetes mellitus with diabetic polyneuropathy: Secondary | ICD-10-CM

## 2021-05-08 NOTE — Telephone Encounter (Signed)
Patient is calling because he has just noticed that his post surgery foot is bleeding thru bandages, sock and onto his carpet, no pain but concerned.  Returned the call to patient and explained that he is to continue to elevate and ice the foot , will schedule an appointment today to see nurse. Patient is scheduled for a visit with the nurse today at 2:00. Appointment has been confirmed w/ patient.

## 2021-05-08 NOTE — Progress Notes (Signed)
Patient in office today with complaint of bleeding through the dressing after having surgery. Dr. Valentina Lucks assessed the area and stated it was okay to redress. Patient was redressed with DSD with extra padding. Applied cam boot as well. Advised patient to call the office if it continues to bleed through and monitor for signs and symptoms.

## 2021-05-09 ENCOUNTER — Telehealth: Payer: Self-pay | Admitting: *Deleted

## 2021-05-09 NOTE — Telephone Encounter (Signed)
Patient called to give information from [facility] [phone] or [phone] that patient may get additional home health assistant. [employer] and spoke with Orene Desanctis and he will fax form.

## 2021-05-09 NOTE — Telephone Encounter (Signed)
Received for from [place], given to physician to complete. Faxed completed for to [facility].Received confirmation:05/09/21.Patient is aware.

## 2021-05-13 NOTE — Telephone Encounter (Signed)
Paper work to [employer] has been faxed-05/12/21

## 2021-05-14 ENCOUNTER — Other Ambulatory Visit: Payer: Self-pay

## 2021-05-14 ENCOUNTER — Ambulatory Visit (INDEPENDENT_AMBULATORY_CARE_PROVIDER_SITE_OTHER): Payer: Medicare Other | Admitting: Podiatry

## 2021-05-14 ENCOUNTER — Ambulatory Visit (INDEPENDENT_AMBULATORY_CARE_PROVIDER_SITE_OTHER): Payer: Medicare Other

## 2021-05-14 DIAGNOSIS — Z9889 Other specified postprocedural states: Secondary | ICD-10-CM

## 2021-05-14 DIAGNOSIS — M86172 Other acute osteomyelitis, left ankle and foot: Secondary | ICD-10-CM

## 2021-05-14 NOTE — Progress Notes (Signed)
d 

## 2021-05-14 NOTE — Progress Notes (Signed)
Subjective:  Patient ID: [name], male    DOB: 1956/01/25,  MRN: [id number]  No chief complaint on file.   DOS: 05/05/2021 Procedure: Left transmetatarsal amputation  65 y.o. male returns for post-op check.  Patient states is doing well.  He has not been changing the bandage.  He denies any other acute event the bandage has been clean dry and intact.  He is nonweightbearing to the left lower extremity in a cam boot.  Review of Systems: Negative except as noted in the HPI. Denies N/V/F/Ch.  Past Medical History:  Diagnosis Date   Anxiety    Arthritis    Cholecystitis, acute 12/20/2013   Lap chole 6/5   Cocaine abuse ([name])    COPD (chronic obstructive pulmonary disease) ([name])    Coronary atherosclerosis of native coronary artery    a. 03/09/2013 Cath/PCI: LM nl, LAD: 50p, 37m (2.5x16 promus DES), LCX nl, OM1 min irregs, LPL/LPDA diff dzs, RCA nondom, mod diff dzs, EF 55%.   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 ([place]) 04/24/2021   History of nephrolithiasis    History of pneumonia    History of stroke    Right MCA distribution, residual left-sided weakness   Hyperlipidemia    Noncompliance    Osteomyelitis of second toe of left foot ([place]) 04/24/2021   Renal cell carcinoma ([name])    Status post radical right nephrectomy August 2015   Sleep apnea    On CPAP, 4L O2 no cpap at home yet   Type 2 diabetes mellitus ([place]) 2011    Current Outpatient Medications:    acetaminophen (TYLENOL) 325 MG tablet, Take 650 mg by mouth as needed (pain)., Disp: , Rfl:    ALPRAZolam (XANAX) 0.5 MG tablet, Take 1 tablet (0.5 mg total) by mouth 3 (three) times daily as needed for anxiety. (Patient not taking: Reported on 04/24/2021), Disp: , Rfl: 0   ALPRAZolam (XANAX) 1 MG tablet, Take 1 mg by mouth 3 (three) times daily as needed., Disp: , Rfl:     atorvastatin (LIPITOR) 20 MG tablet, Take 20 mg by mouth at bedtime., Disp: , Rfl: 1   Budeson-Glycopyrrol-Formoterol (BREZTRI AEROSPHERE) 160-9-4.8 MCG/ACT AERO, Inhale 2 puffs into the lungs in the morning and at bedtime., Disp: 10.7 g, Rfl: 5   budesonide (PULMICORT) 1 MG/2ML nebulizer solution, Take 1 mg by nebulization as needed (shortness of breath)., Disp: , Rfl:    clopidogrel (PLAVIX) 75 MG tablet, TAKE ONE TABLET BY MOUTH EVERY DAY, Disp: 90 tablet, Rfl: 2   diltiazem (CARDIZEM CD) 180 MG 24 hr capsule, TAKE ONE CAPSULE BY MOUTH EVERY DAY, Disp: 90 capsule, Rfl: 2   diphenhydrAMINE (BENADRYL) 25 mg capsule, Take 25 mg by mouth every 6 (six) hours as needed for itching (take with percocet)., Disp: , Rfl:    docusate sodium (COLACE) 100 MG capsule, Take 200 mg by mouth at bedtime. , Disp: , Rfl:    esomeprazole (NEXIUM) 40 MG capsule, Take 40 mg by mouth at bedtime. Pt only take as needed, Disp: , Rfl:    FLUoxetine (PROZAC) 20 MG capsule, Take by mouth., Disp: , Rfl:    FLUoxetine (PROZAC) 40 MG capsule, Take 40 mg by mouth daily. Take with 10mg  capsule for a total of 50mg  daily., Disp: , Rfl:    formoterol (PERFOROMIST) 20 MCG/2ML  nebulizer solution, Take 20 mcg by nebulization as needed (shortness of breath)., Disp: , Rfl:    gabapentin (NEURONTIN) 300 MG capsule, Take 900 mg by mouth 3 (three) times daily., Disp: , Rfl:    glucose blood (TRUE METRIX BLOOD GLUCOSE TEST) test strip, , Disp: , Rfl:    insulin regular human CONCENTRATED (HUMULIN R) 500 UNIT/ML injection, Inject 90 Units into the skin 3 (three) times daily with meals., Disp: , Rfl:    isosorbide mononitrate (IMDUR) 60 MG 24 hr tablet, Take 1 tablet (60 mg total) by mouth daily., Disp: , Rfl:    linagliptin (TRADJENTA) 5 MG TABS tablet, Take 5 mg by mouth daily., Disp: , Rfl:    nitroGLYCERIN (NITROSTAT) 0.4 MG SL tablet, PLACE ONE tablet UNDER THE TONGUE every FIVE minutes as needed FOR CHEST PAIN. IF 3 TABLETS ARE NECESSARY  CALL 911, Disp: 25 tablet, Rfl: 3   oxybutynin (DITROPAN-XL) 10 MG 24 hr tablet, Take 10 mg by mouth daily., Disp: , Rfl: 11   potassium chloride SA (K-DUR,KLOR-CON) 20 MEQ tablet, Take 20 mEq by mouth 2 (two) times daily. , Disp: , Rfl:    pregabalin (LYRICA) 25 MG capsule, Take 25 mg by mouth 2 (two) times daily., Disp: , Rfl:    Probiotic Product (PROBIOTIC PO), Take 1 capsule by mouth daily., Disp: , Rfl:    sucralfate (CARAFATE) 1 g tablet, TAKE ONE TABLET BY MOUTH TWICE DAILY ON an empty stomach (Patient not taking: No sig reported), Disp: , Rfl:    tamsulosin (FLOMAX) 0.4 MG CAPS capsule, Take 0.4 mg by mouth daily. , Disp: , Rfl:    torsemide (DEMADEX) 20 MG tablet, TAKE THREE TABLETS BY MOUTH TWICE DAILY, Disp: 540 tablet, Rfl: 0   zolpidem (AMBIEN) 5 MG tablet, Take 5 mg by mouth at bedtime. For insomnia., Disp: , Rfl:   Social History   Tobacco Use  Smoking Status Some Days   Packs/day: 1.00   Years: 41.00   Pack years: 41.00   Types: Cigarettes   Start date: 05/03/1972  Smokeless Tobacco Never  Tobacco Comments   Smoking 5cigs per day as of 02/26/21    Allergies  Allergen Reactions   Lisinopril Other (See Comments)    Other reaction(s): worsening kidney function   Oxycodone Itching and Nausea Only    Pt is able to tolerate if taken with benadryl   Objective:  There were no vitals filed for this visit. There is no height or weight on file to calculate BMI. Constitutional Well developed. Well nourished.  Vascular Foot warm and well perfused. Capillary refill normal to all digits.   Neurologic Normal speech. Oriented to person, place, and time. Epicritic sensation to light touch grossly present bilaterally.  Dermatologic Skin healing well without signs of infection. Skin edges well coapted without signs of infection.  Orthopedic: Tenderness to palpation noted about the surgical site.   Radiographs: 3 views of skeletally mature adult left foot: Transmetatarsal  amputation noted.  No signs of osteomyelitis noted. Assessment:   1. Status post foot surgery   2. Osteomyelitis of foot, left, acute ([place])    Plan:  Patient was evaluated and treated and all questions answered.  S/p foot surgery left -Progressing as expected post-operatively. -XR: See above -WB Status: Nonweightbearing in left lower extremity in cam boot and walker -Sutures: Intact.  No clinical signs of dehiscence noted.  No complication noted. -Medications: None -Foot redressed.  No follow-ups on file.

## 2021-05-19 ENCOUNTER — Telehealth: Payer: Self-pay | Admitting: *Deleted

## 2021-05-19 NOTE — Telephone Encounter (Signed)
Patient is calling to request someone to come out to change his bandages daily,his mother is unable to do this. Please advise.

## 2021-05-20 ENCOUNTER — Other Ambulatory Visit: Payer: Self-pay | Admitting: *Deleted

## 2021-05-20 DIAGNOSIS — M869 Osteomyelitis, unspecified: Secondary | ICD-10-CM

## 2021-05-20 NOTE — Telephone Encounter (Signed)
Faxed order to [facility] informed

## 2021-05-20 NOTE — Telephone Encounter (Signed)
[facility] called and they have low staffing,unable to send anyone out to patient's home for dressing changes.  Called [facility], no availability for his location.  Faxed to [facility],received confirmation, will wait for their availability.

## 2021-05-21 ENCOUNTER — Ambulatory Visit: Payer: Medicare Other | Admitting: Podiatry

## 2021-05-22 ENCOUNTER — Telehealth: Payer: Self-pay | Admitting: *Deleted

## 2021-05-22 DIAGNOSIS — Z9181 History of falling: Secondary | ICD-10-CM | POA: Diagnosis not present

## 2021-05-22 DIAGNOSIS — K219 Gastro-esophageal reflux disease without esophagitis: Secondary | ICD-10-CM | POA: Diagnosis not present

## 2021-05-22 DIAGNOSIS — M1990 Unspecified osteoarthritis, unspecified site: Secondary | ICD-10-CM | POA: Diagnosis not present

## 2021-05-22 DIAGNOSIS — Z4801 Encounter for change or removal of surgical wound dressing: Secondary | ICD-10-CM | POA: Diagnosis not present

## 2021-05-22 DIAGNOSIS — Z905 Acquired absence of kidney: Secondary | ICD-10-CM | POA: Diagnosis not present

## 2021-05-22 DIAGNOSIS — F17210 Nicotine dependence, cigarettes, uncomplicated: Secondary | ICD-10-CM | POA: Diagnosis not present

## 2021-05-22 DIAGNOSIS — Z4781 Encounter for orthopedic aftercare following surgical amputation: Secondary | ICD-10-CM | POA: Diagnosis not present

## 2021-05-22 DIAGNOSIS — I10 Essential (primary) hypertension: Secondary | ICD-10-CM | POA: Diagnosis not present

## 2021-05-22 DIAGNOSIS — Z7902 Long term (current) use of antithrombotics/antiplatelets: Secondary | ICD-10-CM | POA: Diagnosis not present

## 2021-05-22 DIAGNOSIS — F32A Depression, unspecified: Secondary | ICD-10-CM | POA: Diagnosis not present

## 2021-05-22 DIAGNOSIS — Z89432 Acquired absence of left foot: Secondary | ICD-10-CM | POA: Diagnosis not present

## 2021-05-22 DIAGNOSIS — I2510 Atherosclerotic heart disease of native coronary artery without angina pectoris: Secondary | ICD-10-CM | POA: Diagnosis not present

## 2021-05-22 DIAGNOSIS — J449 Chronic obstructive pulmonary disease, unspecified: Secondary | ICD-10-CM | POA: Diagnosis not present

## 2021-05-22 DIAGNOSIS — E1142 Type 2 diabetes mellitus with diabetic polyneuropathy: Secondary | ICD-10-CM | POA: Diagnosis not present

## 2021-05-22 DIAGNOSIS — C641 Malignant neoplasm of right kidney, except renal pelvis: Secondary | ICD-10-CM | POA: Diagnosis not present

## 2021-05-22 DIAGNOSIS — E1169 Type 2 diabetes mellitus with other specified complication: Secondary | ICD-10-CM | POA: Diagnosis not present

## 2021-05-22 DIAGNOSIS — Z79891 Long term (current) use of opiate analgesic: Secondary | ICD-10-CM | POA: Diagnosis not present

## 2021-05-22 DIAGNOSIS — Z7984 Long term (current) use of oral hypoglycemic drugs: Secondary | ICD-10-CM | POA: Diagnosis not present

## 2021-05-22 DIAGNOSIS — M86172 Other acute osteomyelitis, left ankle and foot: Secondary | ICD-10-CM | POA: Diagnosis not present

## 2021-05-22 DIAGNOSIS — Z91199 Patient's noncompliance with other medical treatment and regimen due to unspecified reason: Secondary | ICD-10-CM | POA: Diagnosis not present

## 2021-05-22 DIAGNOSIS — G4730 Sleep apnea, unspecified: Secondary | ICD-10-CM | POA: Diagnosis not present

## 2021-05-22 NOTE — Telephone Encounter (Signed)
[name] w/ [facility] [phone] calling to let the physician know that the patient was seen today and the right outer side of incision is oozing,when she arrived the bandages were soaked.Does the patient need to be seen in office, what does he advise.[name]

## 2021-05-23 NOTE — Telephone Encounter (Signed)
[name] w/ [facility] is calling and wants to know if it is ok to make another visit to patient for observations and dressing changes from yesterday. Please advise.

## 2021-05-24 DIAGNOSIS — J449 Chronic obstructive pulmonary disease, unspecified: Secondary | ICD-10-CM | POA: Diagnosis not present

## 2021-05-24 DIAGNOSIS — Z79891 Long term (current) use of opiate analgesic: Secondary | ICD-10-CM | POA: Diagnosis not present

## 2021-05-24 DIAGNOSIS — I10 Essential (primary) hypertension: Secondary | ICD-10-CM | POA: Diagnosis not present

## 2021-05-24 DIAGNOSIS — Z91199 Patient's noncompliance with other medical treatment and regimen due to unspecified reason: Secondary | ICD-10-CM | POA: Diagnosis not present

## 2021-05-24 DIAGNOSIS — Z89432 Acquired absence of left foot: Secondary | ICD-10-CM | POA: Diagnosis not present

## 2021-05-24 DIAGNOSIS — K219 Gastro-esophageal reflux disease without esophagitis: Secondary | ICD-10-CM | POA: Diagnosis not present

## 2021-05-24 DIAGNOSIS — M86172 Other acute osteomyelitis, left ankle and foot: Secondary | ICD-10-CM | POA: Diagnosis not present

## 2021-05-24 DIAGNOSIS — Z7902 Long term (current) use of antithrombotics/antiplatelets: Secondary | ICD-10-CM | POA: Diagnosis not present

## 2021-05-24 DIAGNOSIS — Z905 Acquired absence of kidney: Secondary | ICD-10-CM | POA: Diagnosis not present

## 2021-05-24 DIAGNOSIS — G4730 Sleep apnea, unspecified: Secondary | ICD-10-CM | POA: Diagnosis not present

## 2021-05-24 DIAGNOSIS — I2510 Atherosclerotic heart disease of native coronary artery without angina pectoris: Secondary | ICD-10-CM | POA: Diagnosis not present

## 2021-05-24 DIAGNOSIS — E1169 Type 2 diabetes mellitus with other specified complication: Secondary | ICD-10-CM | POA: Diagnosis not present

## 2021-05-24 DIAGNOSIS — F17210 Nicotine dependence, cigarettes, uncomplicated: Secondary | ICD-10-CM | POA: Diagnosis not present

## 2021-05-24 DIAGNOSIS — Z9181 History of falling: Secondary | ICD-10-CM | POA: Diagnosis not present

## 2021-05-24 DIAGNOSIS — Z4781 Encounter for orthopedic aftercare following surgical amputation: Secondary | ICD-10-CM | POA: Diagnosis not present

## 2021-05-24 DIAGNOSIS — Z4801 Encounter for change or removal of surgical wound dressing: Secondary | ICD-10-CM | POA: Diagnosis not present

## 2021-05-24 DIAGNOSIS — C641 Malignant neoplasm of right kidney, except renal pelvis: Secondary | ICD-10-CM | POA: Diagnosis not present

## 2021-05-24 DIAGNOSIS — Z7984 Long term (current) use of oral hypoglycemic drugs: Secondary | ICD-10-CM | POA: Diagnosis not present

## 2021-05-24 DIAGNOSIS — E1142 Type 2 diabetes mellitus with diabetic polyneuropathy: Secondary | ICD-10-CM | POA: Diagnosis not present

## 2021-05-24 DIAGNOSIS — M1990 Unspecified osteoarthritis, unspecified site: Secondary | ICD-10-CM | POA: Diagnosis not present

## 2021-05-24 DIAGNOSIS — F32A Depression, unspecified: Secondary | ICD-10-CM | POA: Diagnosis not present

## 2021-05-25 DIAGNOSIS — F17210 Nicotine dependence, cigarettes, uncomplicated: Secondary | ICD-10-CM | POA: Diagnosis not present

## 2021-05-25 DIAGNOSIS — I2510 Atherosclerotic heart disease of native coronary artery without angina pectoris: Secondary | ICD-10-CM | POA: Diagnosis not present

## 2021-05-25 DIAGNOSIS — J449 Chronic obstructive pulmonary disease, unspecified: Secondary | ICD-10-CM | POA: Diagnosis not present

## 2021-05-25 DIAGNOSIS — Z7902 Long term (current) use of antithrombotics/antiplatelets: Secondary | ICD-10-CM | POA: Diagnosis not present

## 2021-05-25 DIAGNOSIS — Z91199 Patient's noncompliance with other medical treatment and regimen due to unspecified reason: Secondary | ICD-10-CM | POA: Diagnosis not present

## 2021-05-25 DIAGNOSIS — Z9181 History of falling: Secondary | ICD-10-CM | POA: Diagnosis not present

## 2021-05-25 DIAGNOSIS — G4730 Sleep apnea, unspecified: Secondary | ICD-10-CM | POA: Diagnosis not present

## 2021-05-25 DIAGNOSIS — Z79891 Long term (current) use of opiate analgesic: Secondary | ICD-10-CM | POA: Diagnosis not present

## 2021-05-25 DIAGNOSIS — Z905 Acquired absence of kidney: Secondary | ICD-10-CM | POA: Diagnosis not present

## 2021-05-25 DIAGNOSIS — C641 Malignant neoplasm of right kidney, except renal pelvis: Secondary | ICD-10-CM | POA: Diagnosis not present

## 2021-05-25 DIAGNOSIS — E1142 Type 2 diabetes mellitus with diabetic polyneuropathy: Secondary | ICD-10-CM | POA: Diagnosis not present

## 2021-05-25 DIAGNOSIS — Z4781 Encounter for orthopedic aftercare following surgical amputation: Secondary | ICD-10-CM | POA: Diagnosis not present

## 2021-05-25 DIAGNOSIS — Z4801 Encounter for change or removal of surgical wound dressing: Secondary | ICD-10-CM | POA: Diagnosis not present

## 2021-05-25 DIAGNOSIS — E1169 Type 2 diabetes mellitus with other specified complication: Secondary | ICD-10-CM | POA: Diagnosis not present

## 2021-05-25 DIAGNOSIS — F32A Depression, unspecified: Secondary | ICD-10-CM | POA: Diagnosis not present

## 2021-05-25 DIAGNOSIS — Z89432 Acquired absence of left foot: Secondary | ICD-10-CM | POA: Diagnosis not present

## 2021-05-25 DIAGNOSIS — K219 Gastro-esophageal reflux disease without esophagitis: Secondary | ICD-10-CM | POA: Diagnosis not present

## 2021-05-25 DIAGNOSIS — M1990 Unspecified osteoarthritis, unspecified site: Secondary | ICD-10-CM | POA: Diagnosis not present

## 2021-05-25 DIAGNOSIS — M86172 Other acute osteomyelitis, left ankle and foot: Secondary | ICD-10-CM | POA: Diagnosis not present

## 2021-05-25 DIAGNOSIS — I10 Essential (primary) hypertension: Secondary | ICD-10-CM | POA: Diagnosis not present

## 2021-05-25 DIAGNOSIS — Z7984 Long term (current) use of oral hypoglycemic drugs: Secondary | ICD-10-CM | POA: Diagnosis not present

## 2021-05-26 DIAGNOSIS — Z89432 Acquired absence of left foot: Secondary | ICD-10-CM | POA: Diagnosis not present

## 2021-05-26 DIAGNOSIS — M86172 Other acute osteomyelitis, left ankle and foot: Secondary | ICD-10-CM | POA: Diagnosis not present

## 2021-05-26 DIAGNOSIS — Z4781 Encounter for orthopedic aftercare following surgical amputation: Secondary | ICD-10-CM | POA: Diagnosis not present

## 2021-05-26 DIAGNOSIS — Z4801 Encounter for change or removal of surgical wound dressing: Secondary | ICD-10-CM | POA: Diagnosis not present

## 2021-05-26 DIAGNOSIS — Z9181 History of falling: Secondary | ICD-10-CM | POA: Diagnosis not present

## 2021-05-26 DIAGNOSIS — Z905 Acquired absence of kidney: Secondary | ICD-10-CM | POA: Diagnosis not present

## 2021-05-26 DIAGNOSIS — J449 Chronic obstructive pulmonary disease, unspecified: Secondary | ICD-10-CM | POA: Diagnosis not present

## 2021-05-26 DIAGNOSIS — F32A Depression, unspecified: Secondary | ICD-10-CM | POA: Diagnosis not present

## 2021-05-26 DIAGNOSIS — G4730 Sleep apnea, unspecified: Secondary | ICD-10-CM | POA: Diagnosis not present

## 2021-05-26 DIAGNOSIS — E1142 Type 2 diabetes mellitus with diabetic polyneuropathy: Secondary | ICD-10-CM | POA: Diagnosis not present

## 2021-05-26 DIAGNOSIS — I2510 Atherosclerotic heart disease of native coronary artery without angina pectoris: Secondary | ICD-10-CM | POA: Diagnosis not present

## 2021-05-26 DIAGNOSIS — I10 Essential (primary) hypertension: Secondary | ICD-10-CM | POA: Diagnosis not present

## 2021-05-26 DIAGNOSIS — Z7902 Long term (current) use of antithrombotics/antiplatelets: Secondary | ICD-10-CM | POA: Diagnosis not present

## 2021-05-26 DIAGNOSIS — K219 Gastro-esophageal reflux disease without esophagitis: Secondary | ICD-10-CM | POA: Diagnosis not present

## 2021-05-26 DIAGNOSIS — M1990 Unspecified osteoarthritis, unspecified site: Secondary | ICD-10-CM | POA: Diagnosis not present

## 2021-05-26 DIAGNOSIS — E1169 Type 2 diabetes mellitus with other specified complication: Secondary | ICD-10-CM | POA: Diagnosis not present

## 2021-05-26 DIAGNOSIS — Z79891 Long term (current) use of opiate analgesic: Secondary | ICD-10-CM | POA: Diagnosis not present

## 2021-05-26 DIAGNOSIS — C641 Malignant neoplasm of right kidney, except renal pelvis: Secondary | ICD-10-CM | POA: Diagnosis not present

## 2021-05-26 DIAGNOSIS — Z91199 Patient's noncompliance with other medical treatment and regimen due to unspecified reason: Secondary | ICD-10-CM | POA: Diagnosis not present

## 2021-05-26 DIAGNOSIS — F17210 Nicotine dependence, cigarettes, uncomplicated: Secondary | ICD-10-CM | POA: Diagnosis not present

## 2021-05-26 DIAGNOSIS — Z7984 Long term (current) use of oral hypoglycemic drugs: Secondary | ICD-10-CM | POA: Diagnosis not present

## 2021-05-26 NOTE — Telephone Encounter (Signed)
[name] nurse w/ [facility] is calling to request verbal orders for permission for continue visits for patient.Please advise.

## 2021-05-26 NOTE — Telephone Encounter (Signed)
Returned the call to Nurse(Tina)-[phone] w/ [facility] , no answer, left vmessage to continue with nurse visits for patient per physician.

## 2021-05-28 ENCOUNTER — Ambulatory Visit (INDEPENDENT_AMBULATORY_CARE_PROVIDER_SITE_OTHER): Payer: Medicare Other | Admitting: Podiatry

## 2021-05-28 ENCOUNTER — Other Ambulatory Visit: Payer: Self-pay

## 2021-05-28 DIAGNOSIS — M869 Osteomyelitis, unspecified: Secondary | ICD-10-CM

## 2021-05-28 DIAGNOSIS — Z9889 Other specified postprocedural states: Secondary | ICD-10-CM

## 2021-05-28 NOTE — Progress Notes (Signed)
Subjective:  Patient ID: [name], male    DOB: 1955-11-18,  MRN: [id number]  Chief Complaint  Patient presents with   Routine Post Op    POV #2 DOS 05/05/2021 LT METATARSAL AMPUTATION W/CLOSURE     DOS: 05/05/2021 Procedure: Left transmetatarsal amputation  65 y.o. male returns for post-op check.  Patient states is doing well.  He has not been changing the bandage.  He denies any other acute event the bandage has been clean dry and intact.  He is nonweightbearing to the left lower extremity in a cam boot.  Review of Systems: Negative except as noted in the HPI. Denies N/V/F/Ch.  Past Medical History:  Diagnosis Date   Anxiety    Arthritis    Cholecystitis, acute 12/20/2013   Lap chole 6/5   Cocaine abuse ([name])    COPD (chronic obstructive pulmonary disease) ([name])    Coronary atherosclerosis of native coronary artery    a. 03/09/2013 Cath/PCI: LM nl, LAD: 50p, 39m (2.5x16 promus DES), LCX nl, OM1 min irregs, LPL/LPDA diff dzs, RCA nondom, mod diff dzs, EF 55%.   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 ([place]) 04/24/2021   History of nephrolithiasis    History of pneumonia    History of stroke    Right MCA distribution, residual left-sided weakness   Hyperlipidemia    Noncompliance    Osteomyelitis of second toe of left foot ([place]) 04/24/2021   Renal cell carcinoma ([name])    Status post radical right nephrectomy August 2015   Sleep apnea    On CPAP, 4L O2 no cpap at home yet   Type 2 diabetes mellitus ([place]) 2011    Current Outpatient Medications:    acetaminophen (TYLENOL) 325 MG tablet, Take 650 mg by mouth as needed (pain)., Disp: , Rfl:    ALPRAZolam (XANAX) 0.5 MG tablet, Take 1 tablet (0.5 mg total) by mouth 3 (three) times daily as needed for anxiety. (Patient not taking: Reported on 04/24/2021), Disp: , Rfl: 0   ALPRAZolam  (XANAX) 1 MG tablet, Take 1 mg by mouth 3 (three) times daily as needed., Disp: , Rfl:    atorvastatin (LIPITOR) 20 MG tablet, Take 20 mg by mouth at bedtime., Disp: , Rfl: 1   Budeson-Glycopyrrol-Formoterol (BREZTRI AEROSPHERE) 160-9-4.8 MCG/ACT AERO, Inhale 2 puffs into the lungs in the morning and at bedtime., Disp: 10.7 g, Rfl: 5   budesonide (PULMICORT) 1 MG/2ML nebulizer solution, Take 1 mg by nebulization as needed (shortness of breath)., Disp: , Rfl:    clopidogrel (PLAVIX) 75 MG tablet, TAKE ONE TABLET BY MOUTH EVERY DAY, Disp: 90 tablet, Rfl: 2   diltiazem (CARDIZEM CD) 180 MG 24 hr capsule, TAKE ONE CAPSULE BY MOUTH EVERY DAY, Disp: 90 capsule, Rfl: 2   diphenhydrAMINE (BENADRYL) 25 mg capsule, Take 25 mg by mouth every 6 (six) hours as needed for itching (take with percocet)., Disp: , Rfl:    docusate sodium (COLACE) 100 MG capsule, Take 200 mg by mouth at bedtime. , Disp: , Rfl:    esomeprazole (NEXIUM) 40 MG capsule, Take 40 mg by mouth at bedtime. Pt only take as needed, Disp: , Rfl:    FLUoxetine (PROZAC) 20 MG capsule, Take by mouth., Disp: , Rfl:    FLUoxetine (PROZAC) 40 MG capsule, Take 40 mg by mouth daily. Take  with 10mg  capsule for a total of 50mg  daily., Disp: , Rfl:    formoterol (PERFOROMIST) 20 MCG/2ML nebulizer solution, Take 20 mcg by nebulization as needed (shortness of breath)., Disp: , Rfl:    gabapentin (NEURONTIN) 300 MG capsule, Take 900 mg by mouth 3 (three) times daily., Disp: , Rfl:    glucose blood (TRUE METRIX BLOOD GLUCOSE TEST) test strip, , Disp: , Rfl:    insulin regular human CONCENTRATED (HUMULIN R) 500 UNIT/ML injection, Inject 90 Units into the skin 3 (three) times daily with meals., Disp: , Rfl:    isosorbide mononitrate (IMDUR) 60 MG 24 hr tablet, Take 1 tablet (60 mg total) by mouth daily., Disp: , Rfl:    linagliptin (TRADJENTA) 5 MG TABS tablet, Take 5 mg by mouth daily., Disp: , Rfl:    nitroGLYCERIN (NITROSTAT) 0.4 MG SL tablet, PLACE ONE tablet  UNDER THE TONGUE every FIVE minutes as needed FOR CHEST PAIN. IF 3 TABLETS ARE NECESSARY CALL 911, Disp: 25 tablet, Rfl: 3   oxybutynin (DITROPAN-XL) 10 MG 24 hr tablet, Take 10 mg by mouth daily., Disp: , Rfl: 11   potassium chloride SA (K-DUR,KLOR-CON) 20 MEQ tablet, Take 20 mEq by mouth 2 (two) times daily. , Disp: , Rfl:    pregabalin (LYRICA) 25 MG capsule, Take 25 mg by mouth 2 (two) times daily., Disp: , Rfl:    Probiotic Product (PROBIOTIC PO), Take 1 capsule by mouth daily., Disp: , Rfl:    sucralfate (CARAFATE) 1 g tablet, TAKE ONE TABLET BY MOUTH TWICE DAILY ON an empty stomach (Patient not taking: No sig reported), Disp: , Rfl:    tamsulosin (FLOMAX) 0.4 MG CAPS capsule, Take 0.4 mg by mouth daily. , Disp: , Rfl:    torsemide (DEMADEX) 20 MG tablet, TAKE THREE TABLETS BY MOUTH TWICE DAILY, Disp: 540 tablet, Rfl: 0   zolpidem (AMBIEN) 5 MG tablet, Take 5 mg by mouth at bedtime. For insomnia., Disp: , Rfl:   Social History   Tobacco Use  Smoking Status Some Days   Packs/day: 1.00   Years: 41.00   Pack years: 41.00   Types: Cigarettes   Start date: 05/03/1972  Smokeless Tobacco Never  Tobacco Comments   Smoking 5cigs per day as of 02/26/21    Allergies  Allergen Reactions   Lisinopril Other (See Comments)    Other reaction(s): worsening kidney function   Oxycodone Itching and Nausea Only    Pt is able to tolerate if taken with benadryl   Objective:  There were no vitals filed for this visit. There is no height or weight on file to calculate BMI. Constitutional Well developed. Well nourished.  Vascular Foot warm and well perfused. Capillary refill normal to all digits.   Neurologic Normal speech. Oriented to person, place, and time. Epicritic sensation to light touch grossly present bilaterally.  Dermatologic Superficial at the essence of the transmetatarsal amputation noted.  No probing down to deep tissue or bone noted.  No purulent drainage noted no redness noted.   Orthopedic: No further tenderness to palpation noted about the surgical site.   Radiographs: 3 views of skeletally mature adult left foot: Transmetatarsal amputation noted.  No signs of osteomyelitis noted. Assessment:   1. Status post foot surgery   2. Osteomyelitis of second toe of left foot ([place])     Plan:  Patient was evaluated and treated and all questions answered.  S/p foot surgery left -Progressing as expected post-operatively. -XR: See above -WB Status: Nonweightbearing in  left lower extremity in cam boot and walker -Sutures: Removed.  Superficial dehiscence noted.  Continue local wound care with Betadine wet-to-dry dressing -Medications: None -At this time given that patient has superficial dehiscence I believe patient will benefit from going over the wound care center to help close the wound.  I discussed this with patient he states understanding and he would like to return to wound care center  No follow-ups on file.

## 2021-05-29 ENCOUNTER — Telehealth: Payer: Self-pay | Admitting: Podiatry

## 2021-05-29 DIAGNOSIS — Z905 Acquired absence of kidney: Secondary | ICD-10-CM | POA: Diagnosis not present

## 2021-05-29 DIAGNOSIS — Z7902 Long term (current) use of antithrombotics/antiplatelets: Secondary | ICD-10-CM | POA: Diagnosis not present

## 2021-05-29 DIAGNOSIS — Z91199 Patient's noncompliance with other medical treatment and regimen due to unspecified reason: Secondary | ICD-10-CM | POA: Diagnosis not present

## 2021-05-29 DIAGNOSIS — F17210 Nicotine dependence, cigarettes, uncomplicated: Secondary | ICD-10-CM | POA: Diagnosis not present

## 2021-05-29 DIAGNOSIS — F32A Depression, unspecified: Secondary | ICD-10-CM | POA: Diagnosis not present

## 2021-05-29 DIAGNOSIS — Z4781 Encounter for orthopedic aftercare following surgical amputation: Secondary | ICD-10-CM | POA: Diagnosis not present

## 2021-05-29 DIAGNOSIS — M1990 Unspecified osteoarthritis, unspecified site: Secondary | ICD-10-CM | POA: Diagnosis not present

## 2021-05-29 DIAGNOSIS — E1142 Type 2 diabetes mellitus with diabetic polyneuropathy: Secondary | ICD-10-CM | POA: Diagnosis not present

## 2021-05-29 DIAGNOSIS — J449 Chronic obstructive pulmonary disease, unspecified: Secondary | ICD-10-CM | POA: Diagnosis not present

## 2021-05-29 DIAGNOSIS — K219 Gastro-esophageal reflux disease without esophagitis: Secondary | ICD-10-CM | POA: Diagnosis not present

## 2021-05-29 DIAGNOSIS — Z4801 Encounter for change or removal of surgical wound dressing: Secondary | ICD-10-CM | POA: Diagnosis not present

## 2021-05-29 DIAGNOSIS — C641 Malignant neoplasm of right kidney, except renal pelvis: Secondary | ICD-10-CM | POA: Diagnosis not present

## 2021-05-29 DIAGNOSIS — I2510 Atherosclerotic heart disease of native coronary artery without angina pectoris: Secondary | ICD-10-CM | POA: Diagnosis not present

## 2021-05-29 DIAGNOSIS — E1169 Type 2 diabetes mellitus with other specified complication: Secondary | ICD-10-CM | POA: Diagnosis not present

## 2021-05-29 DIAGNOSIS — Z89432 Acquired absence of left foot: Secondary | ICD-10-CM | POA: Diagnosis not present

## 2021-05-29 DIAGNOSIS — Z79891 Long term (current) use of opiate analgesic: Secondary | ICD-10-CM | POA: Diagnosis not present

## 2021-05-29 DIAGNOSIS — M86172 Other acute osteomyelitis, left ankle and foot: Secondary | ICD-10-CM | POA: Diagnosis not present

## 2021-05-29 DIAGNOSIS — G4730 Sleep apnea, unspecified: Secondary | ICD-10-CM | POA: Diagnosis not present

## 2021-05-29 DIAGNOSIS — Z7984 Long term (current) use of oral hypoglycemic drugs: Secondary | ICD-10-CM | POA: Diagnosis not present

## 2021-05-29 DIAGNOSIS — Z9181 History of falling: Secondary | ICD-10-CM | POA: Diagnosis not present

## 2021-05-29 DIAGNOSIS — I10 Essential (primary) hypertension: Secondary | ICD-10-CM | POA: Diagnosis not present

## 2021-05-29 NOTE — Telephone Encounter (Signed)
[name] from [facility] called , they can not come out daily and change the dressing for patient they can do twice a week .  Or they will need instructions changed . Please advise

## 2021-05-30 ENCOUNTER — Ambulatory Visit: Payer: Medicare Other | Admitting: Infectious Disease

## 2021-05-30 DIAGNOSIS — E1140 Type 2 diabetes mellitus with diabetic neuropathy, unspecified: Secondary | ICD-10-CM | POA: Diagnosis not present

## 2021-05-30 DIAGNOSIS — G8929 Other chronic pain: Secondary | ICD-10-CM | POA: Diagnosis not present

## 2021-05-30 DIAGNOSIS — G629 Polyneuropathy, unspecified: Secondary | ICD-10-CM | POA: Diagnosis not present

## 2021-05-30 DIAGNOSIS — J449 Chronic obstructive pulmonary disease, unspecified: Secondary | ICD-10-CM | POA: Diagnosis not present

## 2021-06-02 ENCOUNTER — Encounter (HOSPITAL_BASED_OUTPATIENT_CLINIC_OR_DEPARTMENT_OTHER): Payer: Medicare Other | Attending: Internal Medicine | Admitting: Internal Medicine

## 2021-06-02 ENCOUNTER — Other Ambulatory Visit: Payer: Self-pay

## 2021-06-02 DIAGNOSIS — Z9981 Dependence on supplemental oxygen: Secondary | ICD-10-CM | POA: Insufficient documentation

## 2021-06-02 DIAGNOSIS — J449 Chronic obstructive pulmonary disease, unspecified: Secondary | ICD-10-CM | POA: Insufficient documentation

## 2021-06-02 DIAGNOSIS — F17210 Nicotine dependence, cigarettes, uncomplicated: Secondary | ICD-10-CM | POA: Insufficient documentation

## 2021-06-02 DIAGNOSIS — Y835 Amputation of limb(s) as the cause of abnormal reaction of the patient, or of later complication, without mention of misadventure at the time of the procedure: Secondary | ICD-10-CM | POA: Diagnosis not present

## 2021-06-02 DIAGNOSIS — Z89412 Acquired absence of left great toe: Secondary | ICD-10-CM | POA: Diagnosis not present

## 2021-06-02 DIAGNOSIS — Z794 Long term (current) use of insulin: Secondary | ICD-10-CM | POA: Insufficient documentation

## 2021-06-02 DIAGNOSIS — E11621 Type 2 diabetes mellitus with foot ulcer: Secondary | ICD-10-CM | POA: Insufficient documentation

## 2021-06-02 DIAGNOSIS — L97528 Non-pressure chronic ulcer of other part of left foot with other specified severity: Secondary | ICD-10-CM | POA: Insufficient documentation

## 2021-06-02 DIAGNOSIS — T8131XA Disruption of external operation (surgical) wound, not elsewhere classified, initial encounter: Secondary | ICD-10-CM | POA: Diagnosis not present

## 2021-06-02 DIAGNOSIS — E1165 Type 2 diabetes mellitus with hyperglycemia: Secondary | ICD-10-CM | POA: Insufficient documentation

## 2021-06-02 NOTE — Progress Notes (Signed)
LEHI PHIFER ([id number]) , Visit Report for 06/02/2021 Arrival Information Details Patient Name: Date of Service: NAREG, BREIGHNER 06/02/2021 12:30 PM Medical Record Number: [id number] Patient Account Number: [id number] Date of Birth/Sex: Treating RN: 1955/08/06 (65 y.o. [name] Primary Care Keanan Melander: Carolee Rota Other Clinician: Referring Yazmen Briones: Treating Julien Berryman/Extender: Maryagnes Amos in Treatment: 12 Visit Information History Since Last Visit Added or deleted any medications: No Patient Arrived: Kasandra Knudsen Any new allergies or adverse reactions: No Arrival Time: 12:40 Had a fall or experienced change in No Transfer Assistance: None activities of daily living that may affect Patient Identification Verified: Yes risk of falls: Secondary Verification Process Completed: Yes Signs or symptoms of abuse/neglect since last No Patient Requires Transmission-Based Precautions: No visito Patient Has Alerts: Yes Hospitalized since last visit: No Patient Alerts: ABI's: R: 0.82 L:0.76 Implantable device outside of the clinic No excluding cellular tissue based products placed in the center since last visit: Has Dressing in Place as Prescribed: Yes Has Footwear/Offloading in Place as Yes Prescribed: Left: Removable Cast Walker/Walking Boot Pain Present Now: No Electronic Signature(s) Signed: 06/02/2021 4:35:28 PM By: Lorrin Jackson Entered By: Lorrin Jackson on 06/02/2021 12:43:31 -------------------------------------------------------------------------------- Encounter Discharge Information Details Patient Name: Date of Service: [name]. 06/02/2021 12:30 PM Medical Record Number: [id number] Patient Account Number: [id number] Date of Birth/Sex: Treating RN: Feb 01, 1956 (65 y.o. [name] Primary Care Kenniel Bergsma: Carolee Rota Other Clinician: Referring Alessander Sikorski: Treating Jafar Poffenberger/Extender: Maryagnes Amos  in Treatment: 12 Encounter Discharge Information Items Post Procedure Vitals Discharge Condition: Stable Temperature (F): 98.8 Ambulatory Status: Cane Pulse (bpm): 101 Discharge Destination: Home Respiratory Rate (breaths/min): 18 Transportation: Private Auto Blood Pressure (mmHg): 128/68 Schedule Follow-up Appointment: Yes Clinical Summary of Care: Provided on 06/02/2021 Form Type Recipient Paper Patient Patient Electronic Signature(s) Signed: 06/02/2021 4:35:28 PM By: Lorrin Jackson Entered By: Lorrin Jackson on 06/02/2021 13:26:08 -------------------------------------------------------------------------------- Lower Extremity Assessment Details Patient Name: Date of Service: [name]. 06/02/2021 12:30 PM Medical Record Number: [id number] Patient Account Number: [id number] Date of Birth/Sex: Treating RN: [birth year] (65 y.o. [name] Primary Care Andriel Omalley: Carolee Rota Other Clinician: Referring Kelliann Pendergraph: Treating Morning Halberg/Extender: Maryagnes Amos in Treatment: 12 Edema Assessment Assessed: [name]: Yes] [name]: No] Edema: [Left: Ye] [Right: s] Calf Left: Right: Point of Measurement: 37 cm From Medial Instep 37 cm Ankle Left: Right: Point of Measurement: 8 cm From Medial Instep 23 cm Vascular Assessment Pulses: Dorsalis Pedis Palpable: [Left:Yes] Electronic Signature(s) Signed: 06/02/2021 4:35:28 PM By: Lorrin Jackson Entered By: Lorrin Jackson on 06/02/2021 12:45:16 -------------------------------------------------------------------------------- Multi Wound Chart Details Patient Name: Date of Service: [name]. 06/02/2021 12:30 PM Medical Record Number: [id number] Patient Account Number: [id number] Date of Birth/Sex: Treating RN: 07-24-1955 (65 y.o. [name] Primary Care Kinze Labo: Carolee Rota Other Clinician: Referring Burch Marchuk: Treating Hortencia Martire/Extender: Maryagnes Amos in  Treatment: 12 Vital Signs Height(in): Capillary Blood Glucose(mg/dl): 306 Weight(lbs): Pulse(bpm): 101 Body Mass Index(BMI): Blood Pressure(mmHg): 128/68 Temperature(F): 97.8 Respiratory Rate(breaths/min): 18 Photos: [5:No Photos Left Amputation Site - Toe] [6:No 32 Left T Second oe] [7:Left Amputation Site - Transmetatarsal] Wound Location: [5:Surgical Injury] [6:Gradually Appeared] [7:Surgical Injury] Wounding Event: [5:Open Surgical Wound] [6:Diabetic Wound/Ulcer of the Lower] [7:Dehisced Wound] Primary Etiology: [5:Chronic Obstructive Pulmonary] [6:Extremity Chronic Obstructive Pulmonary] [7:Chronic Obstructive Pulmonary] Comorbid History: [5:Disease (COPD), Sleep Apnea, Coronary Artery Disease, Hypertension, Type II Diabetes 12/27/2020] [6:Disease (COPD), Sleep Apnea, Coronary Artery Disease, Hypertension, Type II Diabetes 04/15/2021] [7:Disease (COPD), Sleep Apnea,  Coronary Artery Disease,  Hypertension, Type II Diabetes 05/20/2021] Date Acquired: [5:12] [6:6] [7:0] Weeks of Treatment: [5:Converted] [6:Amputation] [7:Open] Wound Status: [5:N/A] [6:0x0x0] [7:1x8.6x0.4] Measurements L x W x D (cm) [5:N/A] [6:0] [7:6.754] A (cm) : rea [5:N/A] [6:0] [7:2.702] Volume (cm) : [5:N/A] [6:100.00%] [7:0.00%] % Reduction in Area: [5:N/A] [6:100.00%] [7:0.00%] % Reduction in Volume: [5:Full Thickness With Exposed Support Grade 1] [7:Full Thickness Without Exposed] Classification: [5:Structures Small] [6:Medium] [7:Support Structures Medium] Exudate Amount: [5:Serosanguineous] [6:Serosanguineous] [7:Serosanguineous] Exudate Type: [5:red, brown] [6:red, brown] [7:red, brown] Exudate Color: [5:No] [6:No] [7:Yes] Foul Odor A Cleansing: [5:fter N/A] [6:N/A] [7:No] Odor Anticipated Due to Product Use: [5:Well defined, not attached] [6:Thickened] [7:Well defined, not attached] Wound Margin: [5:Large (67-100%)] [6:Large (67-100%)] [7:Small (1-33%)] Granulation A mount: [5:Red, Pink] [6:Pink]  [7:Red] Granulation Quality: [5:None Present (0%)] [6:None Present (0%)] [7:Large (67-100%)] Necrotic Amount: [5:Fat Layer (Subcutaneous Tissue): Yes Fat Layer (Subcutaneous Tissue): Yes Fat Layer (Subcutaneous Tissue): Yes] Exposed Structures: [5:Fascia: No Tendon: No Muscle: No Joint: No Bone: No Small (1-33%)] [6:Fascia: No Tendon: No Muscle: No Joint: No Bone: No None] [7:Fascia: No Tendon: No Muscle: No Joint: No Bone: No None] Epithelialization: [5:N/A] [6:N/A] [7:Debridement - Excisional] Debridement: Pre-procedure Verification/Time Out N/A [6:N/A] [7:12:59] Taken: [5:N/A] [6:N/A] [7:Other] Pain Control: [5:N/A] [6:N/A] [7:Subcutaneous, Slough] Tissue Debrided: [5:N/A] [6:N/A] [7:Skin/Subcutaneous Tissue] Level: [5:N/A] [6:N/A] [7:5] Debridement A (sq cm): [5:rea N/A] [6:N/A] [7:Curette] Instrument: [5:N/A] [6:N/A] [7:Minimum] Bleeding: [5:N/A] [6:N/A] [7:Pressure] Hemostasis A chieved: [5:N/A] [6:N/A] [7:Procedure was tolerated well] Debridement Treatment Response: [5:N/A] [6:N/A] [7:1x8.6x0.4] Post Debridement Measurements L x W x D (cm) [5:N/A] [6:N/A] [7:2.702] Post Debridement Volume: (cm) [5:N/A] [6:N/A] [7:Debridement] Treatment Notes Wound #7 (Amputation Site - Transmetatarsal) Wound Laterality: Left Cleanser Soap and Water Discharge Instruction: May shower and wash wound with dial antibacterial soap and water prior to dressing change. Wound Cleanser Discharge Instruction: Cleanse the wound with wound cleanser prior to applying a clean dressing using gauze sponges, not tissue or cotton balls. Peri-Wound Care Topical Primary Dressing IODOFLEX 0.9% Cadexomer Iodine Pad 4x6 cm Discharge Instruction: Or Iodosorb if HH cannot get Iodoflex Secondary Dressing Woven Gauze Sponge, Non-Sterile 4x4 in Discharge Instruction: Apply over primary dressing as directed. ABD Pad, 5x9 Discharge Instruction: Apply over primary dressing as directed. Secured With Elastic Bandage 4  inch (ACE bandage) Discharge Instruction: Secure with ACE bandage as directed. Kerlix Roll Sterile, 4.5x3.1 (in/yd) Discharge Instruction: Secure with Kerlix as directed. Compression Wrap Compression Stockings Add-Ons Electronic Signature(s) Signed: 06/02/2021 3:40:29 PM By: Kalman Shan DO Signed: 06/02/2021 4:35:28 PM By: Lorrin Jackson Entered By: Kalman Shan on 06/02/2021 15:33:40 -------------------------------------------------------------------------------- Multi-Disciplinary Care Plan Details Patient Name: Date of Service: [name]. 06/02/2021 12:30 PM Medical Record Number: [id number] Patient Account Number: [id number] Date of Birth/Sex: Treating RN: [birth year] (65 y.o. [name] Primary Care Sameeha Rockefeller: Carolee Rota Other Clinician: Referring Bridgit Eynon: Treating Sima Lindenberger/Extender: Maryagnes Amos in Treatment: 12 Multidisciplinary Care Plan reviewed with physician Active Inactive Wound/Skin Impairment Nursing Diagnoses: Impaired tissue integrity Knowledge deficit related to ulceration/compromised skin integrity Goals: Patient will have a decrease in wound volume by X% from date: (specify in notes) Date Initiated: 03/06/2021 Date Inactivated: 04/15/2021 Target Resolution Date: 03/26/2021 Goal Status: Met Patient/caregiver will verbalize understanding of skin care regimen Date Initiated: 03/06/2021 Target Resolution Date: 06/30/2021 Goal Status: Active Ulcer/skin breakdown will have a volume reduction of 30% by week 4 Date Initiated: 03/06/2021 Date Inactivated: 04/15/2021 Target Resolution Date: 03/26/2021 Goal Status: Unmet Unmet Reason: probable osteomyelitis Ulcer/skin breakdown will have a volume reduction of 50% by  week 8 Date Initiated: 04/15/2021 Date Inactivated: 04/21/2021 Target Resolution Date: 04/22/2021 Goal Status: Met Ulcer/skin breakdown will have a volume reduction of 80% by week 12 Date Initiated:  04/21/2021 Target Resolution Date: 05/19/2021 Goal Status: Active Interventions: Assess patient/caregiver ability to obtain necessary supplies Assess patient/caregiver ability to perform ulcer/skin care regimen upon admission and as needed Assess ulceration(s) every visit Notes: 06/02/21: Patient had transmet 10/27. Incision dehisced, wound care ongoing. Electronic Signature(s) Signed: 06/02/2021 4:35:28 PM By: Lorrin Jackson Entered By: Lorrin Jackson on 06/02/2021 12:46:52 -------------------------------------------------------------------------------- Pain Assessment Details Patient Name: Date of Service: DAILEN, MCCLISH 06/02/2021 12:30 PM Medical Record Number: [id number] Patient Account Number: [id number] Date of Birth/Sex: Treating RN: [birth year] (65 y.o. [name] Primary Care Kenndra Morris: Carolee Rota Other Clinician: Referring Candelario Steppe: Treating Shalimar Mcclain/Extender: Maryagnes Amos in Treatment: 12 Active Problems Location of Pain Severity and Description of Pain Patient Has Paino No Site Locations Pain Management and Medication Current Pain Management: Electronic Signature(s) Signed: 06/02/2021 4:35:28 PM By: Lorrin Jackson Entered By: Lorrin Jackson on 06/02/2021 12:44:20 -------------------------------------------------------------------------------- Patient/Caregiver Education Details Patient Name: Date of Service: [name] 11/14/2022andnbsp12:30 PM Medical Record Number: [id number] Patient Account Number: [id number] Date of Birth/Gender: Treating RN: 1955/12/13 (65 y.o. [name] Primary Care Physician: Carolee Rota Other Clinician: Referring Physician: Treating Physician/Extender: Maryagnes Amos in Treatment: 12 Education Assessment Education Provided To: Patient Education Topics Provided Offloading: Methods: Explain/Verbal, Printed Responses: State content correctly Wound/Skin  Impairment: Methods: Demonstration, Explain/Verbal, Printed Responses: State content correctly Electronic Signature(s) Signed: 06/02/2021 4:35:28 PM By: Lorrin Jackson Entered By: Lorrin Jackson on 06/02/2021 12:47:34 -------------------------------------------------------------------------------- Wound Assessment Details Patient Name: Date of Service: [name]. 06/02/2021 12:30 PM Medical Record Number: [id number] Patient Account Number: [id number] Date of Birth/Sex: Treating RN: 25-Jul-1955 (65 y.o. [name] Primary Care Ronnie Mallette: Carolee Rota Other Clinician: Referring Alajia Schmelzer: Treating Talasia Saulter/Extender: Maryagnes Amos in Treatment: 12 Wound Status Wound Number: 5 Primary Open Surgical Wound Etiology: Wound Location: Left Amputation Site - Toe Wound Converted Wounding Event: Surgical Injury Status: Date Acquired: 12/27/2020 Comorbid Chronic Obstructive Pulmonary Disease (COPD), Sleep Apnea, Weeks Of Treatment: 12 History: Coronary Artery Disease, Hypertension, Type II Diabetes Clustered Wound: No Wound Measurements % Reduction in Area: % Reduction in Volume: Epithelialization: Small (1-33%) Wound Description Classification: Full Thickness With Exposed Support Structures Wound Margin: Well defined, not attached Exudate Amount: Small Exudate Type: Serosanguineous Exudate Color: red, brown Foul Odor After Cleansing: No Slough/Fibrino Yes Wound Bed Granulation Amount: Large (67-100%) Exposed Structure Granulation Quality: Red, Pink Fascia Exposed: No Necrotic Amount: None Present (0%) Fat Layer (Subcutaneous Tissue) Exposed: Yes Tendon Exposed: No Muscle Exposed: No Joint Exposed: No Bone Exposed: No Electronic Signature(s) Signed: 06/02/2021 4:35:28 PM By: Lorrin Jackson Entered By: Lorrin Jackson on 06/02/2021 12:48:53 -------------------------------------------------------------------------------- Wound Assessment  Details Patient Name: Date of Service: [name]. 06/02/2021 12:30 PM Medical Record Number: [id number] Patient Account Number: [id number] Date of Birth/Sex: Treating RN: [birth year] (65 y.o. [name] Primary Care Anisia Leija: Carolee Rota Other Clinician: Referring Vela Render: Treating Toron Bowring/Extender: Maryagnes Amos in Treatment: 12 Wound Status Wound Number: 6 Primary Diabetic Wound/Ulcer of the Lower Extremity Etiology: Wound Location: Left T Second oe Wound Amputation Wounding Event: Gradually Appeared Status: Date Acquired: 04/15/2021 Outcome Transmetatarsal Weeks Of Treatment: 6 Level: Clustered Wound: No Comorbid Chronic Obstructive Pulmonary Disease (COPD), Sleep Apnea, History: Coronary Artery Disease, Hypertension, Type II Diabetes Wound Measurements Length: (cm) Width: (cm) Depth: (cm) Area: (cm) Volume: (  cm) 0 % Reduction in Area: 100% 0 % Reduction in Volume: 100% 0 Epithelialization: None 0 0 Wound Description Classification: Grade 1 Wound Margin: Thickened Exudate Amount: Medium Exudate Type: Serosanguineous Exudate Color: red, brown Foul Odor After Cleansing: No Slough/Fibrino No Wound Bed Granulation Amount: Large (67-100%) Exposed Structure Granulation Quality: Pink Fascia Exposed: No Necrotic Amount: None Present (0%) Fat Layer (Subcutaneous Tissue) Exposed: Yes Tendon Exposed: No Muscle Exposed: No Joint Exposed: No Bone Exposed: No Electronic Signature(s) Signed: 06/02/2021 4:35:28 PM By: Lorrin Jackson Entered By: Lorrin Jackson on 06/02/2021 12:48:33 -------------------------------------------------------------------------------- Wound Assessment Details Patient Name: Date of Service: [name]. 06/02/2021 12:30 PM Medical Record Number: [id number] Patient Account Number: [id number] Date of Birth/Sex: Treating RN: 12-Aug-1955 (64 y.o. [name] Primary Care Wyland Rastetter: Carolee Rota Other Clinician: Referring Gatlyn Lipari: Treating Denelda Akerley/Extender: Maryagnes Amos in Treatment: 12 Wound Status Wound Number: 7 Primary Dehisced Wound Etiology: Wound Location: Left Amputation Site - Transmetatarsal Wound Open Wounding Event: Surgical Injury Status: Date Acquired: 05/20/2021 Comorbid Chronic Obstructive Pulmonary Disease (COPD), Sleep Apnea, Weeks Of Treatment: 0 History: Coronary Artery Disease, Hypertension, Type II Diabetes Clustered Wound: No Photos Wound Measurements Length: (cm) 1 Width: (cm) 8.6 Depth: (cm) 0.4 Area: (cm) 6.754 Volume: (cm) 2.702 % Reduction in Area: 0% % Reduction in Volume: 0% Epithelialization: None Tunneling: No Undermining: No Wound Description Classification: Full Thickness Without Exposed Support Structures Wound Margin: Well defined, not attached Exudate Amount: Medium Exudate Type: Serosanguineous Exudate Color: red, brown Foul Odor After Cleansing: Yes Due to Product Use: No Slough/Fibrino Yes Wound Bed Granulation Amount: Small (1-33%) Exposed Structure Granulation Quality: Red Fascia Exposed: No Necrotic Amount: Large (67-100%) Fat Layer (Subcutaneous Tissue) Exposed: Yes Necrotic Quality: Adherent Slough Tendon Exposed: No Muscle Exposed: No Joint Exposed: No Bone Exposed: No Treatment Notes Wound #7 (Amputation Site - Transmetatarsal) Wound Laterality: Left Cleanser Soap and Water Discharge Instruction: May shower and wash wound with dial antibacterial soap and water prior to dressing change. Wound Cleanser Discharge Instruction: Cleanse the wound with wound cleanser prior to applying a clean dressing using gauze sponges, not tissue or cotton balls. Peri-Wound Care Topical Primary Dressing IODOFLEX 0.9% Cadexomer Iodine Pad 4x6 cm Discharge Instruction: Or Iodosorb if HH cannot get Iodoflex Secondary Dressing Woven Gauze Sponge, Non-Sterile 4x4 in Discharge Instruction:  Apply over primary dressing as directed. ABD Pad, 5x9 Discharge Instruction: Apply over primary dressing as directed. Secured With Elastic Bandage 4 inch (ACE bandage) Discharge Instruction: Secure with ACE bandage as directed. Kerlix Roll Sterile, 4.5x3.1 (in/yd) Discharge Instruction: Secure with Kerlix as directed. Compression Wrap Compression Stockings Add-Ons Electronic Signature(s) Signed: 06/02/2021 4:35:28 PM By: Lorrin Jackson Signed: 06/02/2021 4:35:28 PM By: Lorrin Jackson Entered By: Lorrin Jackson on 06/02/2021 13:05:06 -------------------------------------------------------------------------------- Vitals Details Patient Name: Date of Service: [name]. 06/02/2021 12:30 PM Medical Record Number: [id number] Patient Account Number: [id number] Date of Birth/Sex: Treating RN: Jul 05, 1956 (65 y.o. [name] Primary Care Anylah Scheib: Carolee Rota Other Clinician: Referring Hemi Chacko: Treating Cimone Fahey/Extender: Maryagnes Amos in Treatment: 12 Vital Signs Time Taken: 12:43 Temperature (F): 97.8 Pulse (bpm): 101 Respiratory Rate (breaths/min): 18 Blood Pressure (mmHg): 128/68 Capillary Blood Glucose (mg/dl): 306 Reference Range: 80 - 120 mg / dl Electronic Signature(s) Signed: 06/02/2021 4:35:28 PM By: Lorrin Jackson Entered By: Lorrin Jackson on 06/02/2021 12:44:07

## 2021-06-02 NOTE — Progress Notes (Signed)
[name] ([id number]) , Visit Report for 06/02/2021 Chief Complaint Document Details Patient Name: Date of Service: [name], [name] 06/02/2021 12:30 PM Medical Record Number: [id number] Patient Account Number: [id number] Date of Birth/Sex: Treating RN: Aug 02, 1955 (65 y.o. [name] Primary Care Provider: Carolee [name] Other Clinician: Referring Provider: Treating Provider/Extender: [name] in Treatment: 12 Information Obtained from: Patient Chief Complaint Nonhealing wound secondary to Wound dehiscence status post transmetatarsal amputation on the left Electronic Signature(s) Signed: 06/02/2021 3:40:29 PM By: Kevin Shan DO Entered By: [name] on 06/02/2021 15:36:12 -------------------------------------------------------------------------------- Debridement Details Patient Name: Date of Service: [name]. 06/02/2021 12:30 PM Medical Record Number: [id number] Patient Account Number: [id number] Date of Birth/Sex: Treating RN: 11/27/1955 (65 y.o. [name] Primary Care Provider: Carolee [name] Other Clinician: Referring Provider: Treating Provider/Extender: [name] in Treatment: 12 Debridement Performed for Assessment: Wound #7 Left Amputation Site - Transmetatarsal Performed By: Physician Kevin Shan, DO Debridement Type: Debridement Level of Consciousness (Pre-procedure): Awake and Alert Pre-procedure Verification/Time Out Yes - 12:59 Taken: Start Time: 13:00 Pain Control: Other : Benzocaine T Area Debrided (L x W): otal 1 (cm) x 5 (cm) = 5 (cm) Tissue and other material debrided: Non-Viable, Slough, Subcutaneous, Slough Level: Skin/Subcutaneous Tissue Debridement Description: Excisional Instrument: Curette Bleeding: Minimum Hemostasis Achieved: Pressure End Time: 13:06 Response to Treatment: Procedure was tolerated well Level of Consciousness (Post- Awake and  Alert procedure): Post Debridement Measurements of Total Wound Length: (cm) 1 Width: (cm) 8.6 Depth: (cm) 0.4 Volume: (cm) 2.702 Character of Wound/Ulcer Post Debridement: Stable Post Procedure Diagnosis Same as Pre-procedure Electronic Signature(s) Signed: 06/02/2021 3:40:29 PM By: Kevin Shan DO Signed: 06/02/2021 4:35:28 PM By: [name] Entered By: [name] on 06/02/2021 13:06:29 -------------------------------------------------------------------------------- HPI Details Patient Name: Date of Service: [name]. 06/02/2021 12:30 PM Medical Record Number: [id number] Patient Account Number: [id number] Date of Birth/Sex: Treating RN: 26-Oct-1955 (65 y.o. [name] Primary Care Provider: Carolee [name] Other Clinician: Referring Provider: Treating Provider/Extender: [name] in Treatment: 12 History of Present Illness HPI Description: Admission 8/18 [name] is a 65 year old male with a past medical history of uncontrolled insulin-dependent type 2 diabetes, COPD On O2 6 L via nasal cannula That presents to the clinic for a 71-month history of nonhealing wound to the left great toe amputation site. He has been following with podiatry for a wound to his left great toe that developed osteomyelitis and subsequently required amputation. He had the surgery done on 6/10. He had a partial first ray amputation by Dr. Posey [name]. Since his surgery he has been placing iodine on the wound bed. He states that there has been no improvement to the wound healing in the past 2 months. He states he has occasional intermittent pain to the area. He denies systemic signs of infection. 8/25; patient presents for 1 week follow-up. He states he has been using silver alginate daily without issues. He has no complaints today. He denies signs of infection. He is scheduled to have his MRI on 8/27. 9/15; patient presents for follow-up. He missed his  last clinic appointment due to feeling ill. He continues to use silver alginate daily without any issues. He reports improvement in wound healing. He currently denies signs of infection. 9/27; patient presents for follow-up. He has developed a new wound to the second left toe. He is unaware of this and was noted on exam today. He continues to use silver alginate to the  amputation site wound. He has no issues or complaints today. He currently denies signs of infection. 10/4; patient presents for follow-up. He is using silver alginate to the wound sites. He has no issues or complaints today. He denies systemic signs of infection. 10/10; patient presents for follow-up. He has been using silver alginate to the wound sites. He states that his foot became more swollen over the weekend, But has since improved. He overall feels okay. Admission 06/02/2021 Patient presents for nonhealing wound secondary to wound dehiscence following a transmetatarsal amputation. He states he has been using Betadine wet-to- dry dressings to the wound bed. He states the wound has been open for the past 3 weeks. He currently denies Pain or signs of infection. Electronic Signature(s) Signed: 06/02/2021 3:40:29 PM By: Kevin Shan DO Entered By: [name] on 06/02/2021 15:37:26 -------------------------------------------------------------------------------- Physical Exam Details Patient Name: Date of Service: [name] 06/02/2021 12:30 PM Medical Record Number: [id number] Patient Account Number: [id number] Date of Birth/Sex: Treating RN: Mar 25, 1956 (65 y.o. [name] Primary Care Provider: Carolee [name] Other Clinician: Referring Provider: Treating Provider/Extender: [name] in Treatment: 12 Constitutional respirations regular, non-labored and within target range for patient.[name] Psychiatric pleasant and cooperative. Notes Left foot: Transmetatarsal amputation.  Wound dehiscence at the surgical site. Nonviable tissue throughout the wound bed. No purulent drainage. No signs of infection. Electronic Signature(s) Signed: 06/02/2021 3:40:29 PM By: Kevin Shan DO Entered By: [name] on 06/02/2021 15:38:10 -------------------------------------------------------------------------------- Physician Orders Details Patient Name: Date of Service: [name]. 06/02/2021 12:30 PM Medical Record Number: [id number] Patient Account Number: [id number] Date of Birth/Sex: Treating RN: 20-Sep-1955 (65 y.o. [name] Primary Care Provider: Carolee [name] Other Clinician: Referring Provider: Treating Provider/Extender: [name] in Treatment: 12 Verbal / Phone Orders: No Diagnosis Coding ICD-10 Coding Code Description [phone] Non-pressure chronic ulcer of other part of left foot with other specified severity T81.31XA Disruption of external operation (surgical) wound, not elsewhere classified, initial encounter E11.621 Type 2 diabetes mellitus with foot ulcer J44.9 Chronic obstructive pulmonary disease, unspecified Follow-up Appointments ppointment in 1 week. - Dr. Heber [place] Return A Bathing/ Shower/ Hygiene May shower and wash wound with soap and water. - with dressing changes only. Edema Control - Lymphedema / SCD / Other Elevate legs to the level of the heart or above for 30 minutes daily and/or when sitting, a frequency of: - throughout the day Avoid standing for long periods of time. Moisturize legs daily. - both legs and feet every night before bed. Off-Loading DH Walker Boot to: - left foot per surgeon Additional Orders / Instructions Follow Nutritious Diet - Monitor and keep blood sugars well controlled Home Health New wound care orders this week; continue Home Health for wound care. May utilize formulary equivalent dressing for wound treatment orders unless otherwise specified. - Dressing changed  to Iodoflex (or Iodosorb) 3x week. Other Home Health Orders/Instructions: - [facility] Wound Treatment Wound #7 - Amputation Site - Transmetatarsal Wound Laterality: Left Cleanser: Soap and Water [facility]) Every Other Day/30 Days Discharge Instructions: May shower and wash wound with dial antibacterial soap and water prior to dressing change. Cleanser: Wound Cleanser [facility]) Every Other Day/30 Days Discharge Instructions: Cleanse the wound with wound cleanser prior to applying a clean dressing using gauze sponges, not tissue or cotton balls. Prim Dressing: IODOFLEX 0.9% Cadexomer Iodine Pad 4x6 cm [facility]) Every Other Day/30 Days ary Discharge Instructions: Or Iodosorb if HH cannot get Iodoflex Secondary  Dressing: Woven Gauze Sponge, Non-Sterile 4x4 in [facility]) Every Other Day/30 Days Discharge Instructions: Apply over primary dressing as directed. Secondary Dressing: ABD Pad, 5x9 [facility]) Every Other Day/30 Days Discharge Instructions: Apply over primary dressing as directed. Secured With: Elastic Bandage 4 inch (ACE bandage) (Home Health) Every Other Day/30 Days Discharge Instructions: Secure with ACE bandage as directed. Secured With: [employer], 4.5x3.1 (in/yd) [facility]) Every Other Day/30 Days Discharge Instructions: Secure with Kerlix as directed. Electronic Signature(s) Signed: 06/02/2021 3:40:29 PM By: Kevin Shan DO Entered By: [name] on 06/02/2021 15:38:30 -------------------------------------------------------------------------------- Problem List Details Patient Name: Date of Service: [name]. 06/02/2021 12:30 PM Medical Record Number: [id number] Patient Account Number: [id number] Date of Birth/Sex: Treating RN: October 03, 1955 (65 y.o. [name] Primary Care Provider: Carolee [name] Other Clinician: Referring Provider: Treating Provider/Extender: [name] in Treatment:  12 Active Problems ICD-10 Encounter Code Description Active Date MDM Diagnosis L97.528 Non-pressure chronic ulcer of other part of left foot with other specified 06/02/2021 No Yes severity T81.31XA Disruption of external operation (surgical) wound, not elsewhere classified, 06/02/2021 No Yes initial encounter E11.621 Type 2 diabetes mellitus with foot ulcer 03/06/2021 No Yes J44.9 Chronic obstructive pulmonary disease, unspecified 03/06/2021 No Yes Inactive Problems ICD-10 Code Description Active Date Inactive Date S91.302A Unspecified open wound, left foot, initial encounter 03/06/2021 03/06/2021 Resolved Problems Electronic Signature(s) Signed: 06/02/2021 3:40:29 PM By: Kevin Shan DO Entered By: [name] on 06/02/2021 15:33:17 -------------------------------------------------------------------------------- Progress Note Details Patient Name: Date of Service: [name]. 06/02/2021 12:30 PM Medical Record Number: [id number] Patient Account Number: [id number] Date of Birth/Sex: Treating RN: 14-Apr-1956 (65 y.o. [name] Primary Care Provider: Carolee [name] Other Clinician: Referring Provider: Treating Provider/Extender: [name] in Treatment: 12 Subjective Chief Complaint Information obtained from Patient Nonhealing wound secondary to Wound dehiscence status post transmetatarsal amputation on the left History of Present Illness (HPI) Admission 8/18 [name] is a 65 year old male with a past medical history of uncontrolled insulin-dependent type 2 diabetes, COPD On O2 6 L via nasal cannula That presents to the clinic for a 81-month history of nonhealing wound to the left great toe amputation site. He has been following with podiatry for a wound to his left great toe that developed osteomyelitis and subsequently required amputation. He had the surgery done on 6/10. He had a partial first ray amputation by Dr. Posey [name].  Since his surgery he has been placing iodine on the wound bed. He states that there has been no improvement to the wound healing in the past 2 months. He states he has occasional intermittent pain to the area. He denies systemic signs of infection. 8/25; patient presents for 1 week follow-up. He states he has been using silver alginate daily without issues. He has no complaints today. He denies signs of infection. He is scheduled to have his MRI on 8/27. 9/15; patient presents for follow-up. He missed his last clinic appointment due to feeling ill. He continues to use silver alginate daily without any issues. He reports improvement in wound healing. He currently denies signs of infection. 9/27; patient presents for follow-up. He has developed a new wound to the second left toe. He is unaware of this and was noted on exam today. He continues to use silver alginate to the amputation site wound. He has no issues or complaints today. He currently denies signs of infection. 10/4; patient presents for follow-up. He is using silver alginate to the wound  sites. He has no issues or complaints today. He denies systemic signs of infection. 10/10; patient presents for follow-up. He has been using silver alginate to the wound sites. He states that his foot became more swollen over the weekend, But has since improved. He overall feels okay. Admission 06/02/2021 Patient presents for nonhealing wound secondary to wound dehiscence following a transmetatarsal amputation. He states he has been using Betadine wet-to- dry dressings to the wound bed. He states the wound has been open for the past 3 weeks. He currently denies Pain or signs of infection. Patient History Information obtained from Patient. Family History Cancer - Maternal Grandparents,Paternal Grandparents,Father,Mother, Diabetes - Mother,Father, Heart Disease - Maternal Grandparents,Mother,Father, Kidney Disease - Maternal Grandparents, Lung Disease -  Father, Stroke - Paternal Grandparents,Maternal Grandparents,Mother, No family history of Hereditary Spherocytosis, Hypertension, Seizures, Thyroid Problems, Tuberculosis. Social History Current every day smoker - 1/2 pack a day, Alcohol Use - Never, Drug Use - No History, Caffeine Use - Daily - coffee, tea. Medical History Respiratory Patient has history of Chronic Obstructive Pulmonary Disease (COPD), Sleep Apnea - does not use CPAP Cardiovascular Patient has history of Coronary Artery Disease - mx vessel, Hypertension Gastrointestinal Denies history of Cirrhosis , Colitis, Crohnoos, Hepatitis A, Hepatitis B, Hepatitis C Endocrine Patient has history of Type II Diabetes - last A1c - 13.3 Hospitalization/Surgery History - appendectomy. - cholecystectomy. - laproscopic nephrectomy. - lapro lysis of lesions. - thorascopy. - lung biopsy. - bronchoscopy. - (L) heart cath with angio X3. - endarcdectomy. - patch angioplasty. - left great toe amp. December 27, 2020. Medical A Surgical History Notes nd Constitutional Symptoms (General Health) noncompliance , cocaine abuse ,hepatitis ,headaches Respiratory h/o pneumonia , end stage chronic lung disease , COPD/ILD Cardiovascular h/o CVA , hyperlipidemia , coronary atherosclerosis , acute on chronic diastolic heart failure LVEF 60%-65% Gastrointestinal cholecystitis , GERD Genitourinary h/o nephrolithiasis , h/o renal cell carcinoma , CKD Stage 3 Musculoskeletal arthritis Psychiatric anxiety , depression Objective Constitutional respirations regular, non-labored and within target range for patient.. Vitals Time Taken: 12:43 PM, Temperature: 97.8 F, Pulse: 101 bpm, Respiratory Rate: 18 breaths/min, Blood Pressure: 128/68 mmHg, Capillary Blood Glucose: 306 mg/dl. Psychiatric pleasant and cooperative. General Notes: Left foot: Transmetatarsal amputation. Wound dehiscence at the surgical site. Nonviable tissue throughout the wound bed. No  purulent drainage. No signs of infection. Integumentary (Hair, Skin) Wound #5 status is Converted. Original cause of wound was Surgical Injury. The date acquired was: 12/27/2020. The wound has been in treatment 12 weeks. The wound is located on the Left Amputation Site - T There is Fat Layer (Subcutaneous Tissue) exposed. There is a small amount of serosanguineous drainage oe. noted. The wound margin is well defined and not attached to the wound base. There is large (67-100%) red, pink granulation within the wound bed. There is no necrotic tissue within the wound bed. Wound #6 status is Amputation. Original cause of wound was Gradually Appeared. The date acquired was: 04/15/2021. The wound has been in treatment 6 weeks. The wound is located on the Left T Second. The wound measures 0cm length x 0cm width x 0cm depth; 0cm^2 area and 0cm^3 volume. There is Fat oe Layer (Subcutaneous Tissue) exposed. There is a medium amount of serosanguineous drainage noted. The wound margin is thickened. There is large (67-100%) pink granulation within the wound bed. There is no necrotic tissue within the wound bed. Wound #7 status is Open. Original cause of wound was Surgical Injury. The date acquired was: 05/20/2021. The wound  is located on the Left Amputation Site - Transmetatarsal. The wound measures 1cm length x 8.6cm width x 0.4cm depth; 6.754cm^2 area and 2.702cm^3 volume. There is Fat Layer (Subcutaneous Tissue) exposed. There is no tunneling or undermining noted. There is a medium amount of serosanguineous drainage noted. Foul odor after cleansing was noted. The wound margin is well defined and not attached to the wound base. There is small (1-33%) red granulation within the wound bed. There is a large (67- 100%) amount of necrotic tissue within the wound bed including Adherent Slough. Assessment Active Problems ICD-10 Non-pressure chronic ulcer of other part of left foot with other specified  severity Disruption of external operation (surgical) wound, not elsewhere classified, initial encounter Type 2 diabetes mellitus with foot ulcer Chronic obstructive pulmonary disease, unspecified Last month patient had a transmetatarsal amputation of his left foot. The wound has dehisced and been open for the past 3 weeks. I I debrided nonviable tissue. No signs of infection. At this time I recommended Iodoflex. Follow-up in 1 week. Procedures Wound #7 Pre-procedure diagnosis of Wound #7 is a Dehisced Wound located on the Left Amputation Site - Transmetatarsal . There was a Excisional Skin/Subcutaneous Tissue Debridement with a total area of 5 sq cm performed by Kevin Shan, DO. With the following instrument(s): Curette to remove Non-Viable tissue/material. Material removed includes Subcutaneous Tissue and Slough and after achieving pain control using Other (Benzocaine). No specimens were taken. A time out was conducted at 12:59, prior to the start of the procedure. A Minimum amount of bleeding was controlled with Pressure. The procedure was tolerated well. Post Debridement Measurements: 1cm length x 8.6cm width x 0.4cm depth; 2.702cm^3 volume. Character of Wound/Ulcer Post Debridement is stable. Post procedure Diagnosis Wound #7: Same as Pre-Procedure Plan Follow-up Appointments: Return Appointment in 1 week. - Dr. Heber [place] Bathing/ Shower/ Hygiene: May shower and wash wound with soap and water. - with dressing changes only. Edema Control - Lymphedema / SCD / Other: Elevate legs to the level of the heart or above for 30 minutes daily and/or when sitting, a frequency of: - throughout the day Avoid standing for long periods of time. Moisturize legs daily. - both legs and feet every night before bed. Off-Loading: DH Walker Boot to: - left foot per surgeon Additional Orders / Instructions: Follow Nutritious Diet - Monitor and keep blood sugars well controlled Home Health: New wound care  orders this week; continue Home Health for wound care. May utilize formulary equivalent dressing for wound treatment orders unless otherwise specified. - Dressing changed to Iodoflex (or Iodosorb) 3x week. Other Home Health Orders/Instructions: - [place] #7: - Amputation Site - Transmetatarsal Wound Laterality: Left Cleanser: Soap and Water [facility]) Every Other Day/30 Days Discharge Instructions: May shower and wash wound with dial antibacterial soap and water prior to dressing change. Cleanser: Wound Cleanser [facility]) Every Other Day/30 Days Discharge Instructions: Cleanse the wound with wound cleanser prior to applying a clean dressing using gauze sponges, not tissue or cotton balls. Prim Dressing: IODOFLEX 0.9% Cadexomer Iodine Pad 4x6 cm [facility]) Every Other Day/30 Days ary Discharge Instructions: Or Iodosorb if HH cannot get Iodoflex Secondary Dressing: Woven Gauze Sponge, Non-Sterile 4x4 in [facility]) Every Other Day/30 Days Discharge Instructions: Apply over primary dressing as directed. Secondary Dressing: ABD Pad, 5x9 [facility]) Every Other Day/30 Days Discharge Instructions: Apply over primary dressing as directed. Secured With: Elastic Bandage 4 inch (ACE bandage) (Home Health) Every Other Day/30 Days Discharge Instructions:  Secure with ACE bandage as directed. Secured With: [employer], 4.5x3.1 (in/yd) [facility]) Every Other Day/30 Days Discharge Instructions: Secure with Kerlix as directed. 1. In office sharp debridement 2. Iodoflex 3. Follow-up in 1 week Electronic Signature(s) Signed: 06/02/2021 3:40:29 PM By: Kevin Shan DO Entered By: [name] on 06/02/2021 15:39:42 -------------------------------------------------------------------------------- HxROS Details Patient Name: Date of Service: [name]. 06/02/2021 12:30 PM Medical Record Number: [id number] Patient Account Number: [id number] Date of  Birth/Sex: Treating RN: 08/30/1955 (65 y.o. [name] Primary Care Provider: Carolee [name] Other Clinician: Referring Provider: Treating Provider/Extender: [name] in Treatment: 12 Information Obtained From Patient Constitutional Symptoms (General Health) Medical History: Past Medical History Notes: noncompliance , cocaine abuse ,hepatitis ,headaches Respiratory Medical History: Positive for: Chronic Obstructive Pulmonary Disease (COPD); Sleep Apnea - does not use CPAP Past Medical History Notes: h/o pneumonia , end stage chronic lung disease , COPD/ILD Cardiovascular Medical History: Positive for: Coronary Artery Disease - mx vessel; Hypertension Past Medical History Notes: h/o CVA , hyperlipidemia , coronary atherosclerosis , acute on chronic diastolic heart failure LVEF 60%-65% Gastrointestinal Medical History: Negative for: Cirrhosis ; Colitis; Crohns; Hepatitis A; Hepatitis B; Hepatitis C Past Medical History Notes: cholecystitis , GERD Endocrine Medical History: Positive for: Type II Diabetes - last A1c - 13.3 Genitourinary Medical History: Past Medical History Notes: h/o nephrolithiasis , h/o renal cell carcinoma , CKD Stage 3 Musculoskeletal Medical History: Past Medical History Notes: arthritis Psychiatric Medical History: Past Medical History Notes: anxiety , depression Immunizations Pneumococcal Vaccine: Received Pneumococcal Vaccination: Yes Received Pneumococcal Vaccination On or After 60th Birthday: Yes Implantable Devices None Hospitalization / Surgery History Type of Hospitalization/Surgery appendectomy cholecystectomy laproscopic nephrectomy lapro lysis of lesions thorascopy lung biopsy bronchoscopy (L) heart cath with angio X3 endarcdectomy patch angioplasty left great toe amp. December 27, 2020 Family and Social History Cancer: Yes - Maternal Grandparents,Paternal Grandparents,Father,Mother;  Diabetes: Yes - Mother,Father; Heart Disease: Yes - Maternal Grandparents,Mother,Father; Hereditary Spherocytosis: No; Hypertension: No; Kidney Disease: Yes - Maternal Grandparents; Lung Disease: Yes - Father; Seizures: No; Stroke: Yes - Paternal Grandparents,Maternal Grandparents,Mother; Thyroid Problems: No; Tuberculosis: No; Current every day smoker - 1/2 pack a day; Alcohol Use: Never; Drug Use: No History; Caffeine Use: Daily - coffee, tea; Financial Concerns: No; Food, Clothing or Shelter Needs: No; Support System Lacking: No; Transportation Concerns: No Electronic Signature(s) Signed: 06/02/2021 3:40:29 PM By: Kevin Shan DO Signed: 06/02/2021 4:35:28 PM By: [name] Entered By: [name] on 06/02/2021 15:37:36 -------------------------------------------------------------------------------- SuperBill Details Patient Name: Date of Service: [name]. 06/02/2021 Medical Record Number: [id number] Patient Account Number: [id number] Date of Birth/Sex: Treating RN: [birth year] (65 y.o. [name] Primary Care Provider: Carolee [name] Other Clinician: Referring Provider: Treating Provider/Extender: [name] in Treatment: 12 Diagnosis Coding ICD-10 Codes Code Description [phone] Non-pressure chronic ulcer of other part of left foot with other specified severity T81.31XA Disruption of external operation (surgical) wound, not elsewhere classified, initial encounter E11.621 Type 2 diabetes mellitus with foot ulcer J44.9 Chronic obstructive pulmonary disease, unspecified Facility Procedures CPT4 Code: [id number] Description: 11042 - DEB SUBQ TISSUE 20 SQ CM/< ICD-10 Diagnosis Description L97.528 Non-pressure chronic ulcer of other part of left foot with other specified severi T81.31XA Disruption of external operation (surgical) wound, not elsewhere classified,  init E11.621 Type 2 diabetes mellitus with foot ulcer Modifier: ty ial  encounter Quantity: 1 Physician Procedures : CPT4 Code Description Modifier [id number] - WC PHYS LEVEL 3 - EST PT ICD-10  Diagnosis Description L97.528 Non-pressure chronic ulcer of other part of left foot with other specified severity T81.31XA Disruption of external operation (surgical) wound,  not elsewhere classified, initial encounter E11.621 Type 2 diabetes mellitus with foot ulcer J44.9 Chronic obstructive pulmonary disease, unspecified Quantity: 1 : [id number] 11042 - WC PHYS SUBQ TISS 20 SQ CM ICD-10 Diagnosis Description L97.528 Non-pressure chronic ulcer of other part of left foot with other specified severity T81.31XA Disruption of external operation (surgical) wound, not elsewhere classified,  initial encounter E11.621 Type 2 diabetes mellitus with foot ulcer Quantity: 1 Electronic Signature(s) Signed: 06/02/2021 3:40:29 PM By: Kevin Shan DO Entered By: [name] on 06/02/2021 15:40:07

## 2021-06-03 NOTE — Telephone Encounter (Signed)
Called and spoke w/ Otila Kluver ([facility])giving verbal orders that they can come out 2-3 times weekly,she verbalized understanding.

## 2021-06-04 DIAGNOSIS — F17210 Nicotine dependence, cigarettes, uncomplicated: Secondary | ICD-10-CM | POA: Diagnosis not present

## 2021-06-04 DIAGNOSIS — K219 Gastro-esophageal reflux disease without esophagitis: Secondary | ICD-10-CM | POA: Diagnosis not present

## 2021-06-04 DIAGNOSIS — Z7984 Long term (current) use of oral hypoglycemic drugs: Secondary | ICD-10-CM | POA: Diagnosis not present

## 2021-06-04 DIAGNOSIS — I2510 Atherosclerotic heart disease of native coronary artery without angina pectoris: Secondary | ICD-10-CM | POA: Diagnosis not present

## 2021-06-04 DIAGNOSIS — E1142 Type 2 diabetes mellitus with diabetic polyneuropathy: Secondary | ICD-10-CM | POA: Diagnosis not present

## 2021-06-04 DIAGNOSIS — Z4781 Encounter for orthopedic aftercare following surgical amputation: Secondary | ICD-10-CM | POA: Diagnosis not present

## 2021-06-04 DIAGNOSIS — F32A Depression, unspecified: Secondary | ICD-10-CM | POA: Diagnosis not present

## 2021-06-04 DIAGNOSIS — Z7902 Long term (current) use of antithrombotics/antiplatelets: Secondary | ICD-10-CM | POA: Diagnosis not present

## 2021-06-04 DIAGNOSIS — I10 Essential (primary) hypertension: Secondary | ICD-10-CM | POA: Diagnosis not present

## 2021-06-04 DIAGNOSIS — J449 Chronic obstructive pulmonary disease, unspecified: Secondary | ICD-10-CM | POA: Diagnosis not present

## 2021-06-04 DIAGNOSIS — Z91199 Patient's noncompliance with other medical treatment and regimen due to unspecified reason: Secondary | ICD-10-CM | POA: Diagnosis not present

## 2021-06-04 DIAGNOSIS — Z9181 History of falling: Secondary | ICD-10-CM | POA: Diagnosis not present

## 2021-06-04 DIAGNOSIS — Z905 Acquired absence of kidney: Secondary | ICD-10-CM | POA: Diagnosis not present

## 2021-06-04 DIAGNOSIS — E1169 Type 2 diabetes mellitus with other specified complication: Secondary | ICD-10-CM | POA: Diagnosis not present

## 2021-06-04 DIAGNOSIS — Z89432 Acquired absence of left foot: Secondary | ICD-10-CM | POA: Diagnosis not present

## 2021-06-04 DIAGNOSIS — Z79891 Long term (current) use of opiate analgesic: Secondary | ICD-10-CM | POA: Diagnosis not present

## 2021-06-04 DIAGNOSIS — M86172 Other acute osteomyelitis, left ankle and foot: Secondary | ICD-10-CM | POA: Diagnosis not present

## 2021-06-04 DIAGNOSIS — Z4801 Encounter for change or removal of surgical wound dressing: Secondary | ICD-10-CM | POA: Diagnosis not present

## 2021-06-04 DIAGNOSIS — G4730 Sleep apnea, unspecified: Secondary | ICD-10-CM | POA: Diagnosis not present

## 2021-06-04 DIAGNOSIS — C641 Malignant neoplasm of right kidney, except renal pelvis: Secondary | ICD-10-CM | POA: Diagnosis not present

## 2021-06-04 DIAGNOSIS — M1990 Unspecified osteoarthritis, unspecified site: Secondary | ICD-10-CM | POA: Diagnosis not present

## 2021-06-05 ENCOUNTER — Encounter: Payer: Self-pay | Admitting: Internal Medicine

## 2021-06-05 ENCOUNTER — Ambulatory Visit (INDEPENDENT_AMBULATORY_CARE_PROVIDER_SITE_OTHER): Payer: Medicare Other | Admitting: Internal Medicine

## 2021-06-05 ENCOUNTER — Other Ambulatory Visit: Payer: Self-pay

## 2021-06-05 VITALS — BP 122/80 | HR 104 | Ht 68.0 in | Wt 221.6 lb

## 2021-06-05 DIAGNOSIS — Z720 Tobacco use: Secondary | ICD-10-CM | POA: Diagnosis not present

## 2021-06-05 DIAGNOSIS — J9611 Chronic respiratory failure with hypoxia: Secondary | ICD-10-CM

## 2021-06-05 DIAGNOSIS — J438 Other emphysema: Secondary | ICD-10-CM

## 2021-06-05 DIAGNOSIS — J449 Chronic obstructive pulmonary disease, unspecified: Secondary | ICD-10-CM

## 2021-06-05 DIAGNOSIS — N189 Chronic kidney disease, unspecified: Secondary | ICD-10-CM | POA: Diagnosis not present

## 2021-06-05 LAB — HEPATIC FUNCTION PANEL
ALT: 7 U/L (ref 0–53)
AST: 8 U/L (ref 0–37)
Albumin: 3.9 g/dL (ref 3.5–5.2)
Alkaline Phosphatase: 85 U/L (ref 39–117)
Bilirubin, Direct: 0.1 mg/dL (ref 0.0–0.3)
Total Bilirubin: 0.6 mg/dL (ref 0.2–1.2)
Total Protein: 7.7 g/dL (ref 6.0–8.3)

## 2021-06-05 LAB — CBC WITH DIFFERENTIAL/PLATELET
Basophils Absolute: 0 10*3/uL (ref 0.0–0.1)
Basophils Relative: 0.3 % (ref 0.0–3.0)
Eosinophils Absolute: 0.1 10*3/uL (ref 0.0–0.7)
Eosinophils Relative: 0.5 % (ref 0.0–5.0)
HCT: 37.2 % — ABNORMAL LOW (ref 39.0–52.0)
Hemoglobin: 12.4 g/dL — ABNORMAL LOW (ref 13.0–17.0)
Lymphocytes Relative: 9.1 % — ABNORMAL LOW (ref 12.0–46.0)
Lymphs Abs: 1.3 10*3/uL (ref 0.7–4.0)
MCHC: 33.2 g/dL (ref 30.0–36.0)
MCV: 81.6 fl (ref 78.0–100.0)
Monocytes Absolute: 0.7 10*3/uL (ref 0.1–1.0)
Monocytes Relative: 4.5 % (ref 3.0–12.0)
Neutro Abs: 12.7 10*3/uL — ABNORMAL HIGH (ref 1.4–7.7)
Neutrophils Relative %: 85.6 % — ABNORMAL HIGH (ref 43.0–77.0)
Platelets: 229 10*3/uL (ref 150.0–400.0)
RBC: 4.56 Mil/uL (ref 4.22–5.81)
RDW: 14.6 % (ref 11.5–15.5)
WBC: 14.8 10*3/uL — ABNORMAL HIGH (ref 4.0–10.5)

## 2021-06-05 LAB — BASIC METABOLIC PANEL
BUN: 28 mg/dL — ABNORMAL HIGH (ref 6–23)
CO2: 29 mEq/L (ref 19–32)
Calcium: 8.5 mg/dL (ref 8.4–10.5)
Chloride: 90 mEq/L — ABNORMAL LOW (ref 96–112)
Creatinine, Ser: 1.89 mg/dL — ABNORMAL HIGH (ref 0.40–1.50)
GFR: 36.69 mL/min — ABNORMAL LOW (ref >60.00)
Glucose, Bld: 441 mg/dL — ABNORMAL HIGH (ref 70–99)
Potassium: 3.9 mEq/L (ref 3.5–5.1)
Sodium: 128 mEq/L — ABNORMAL LOW (ref 135–145)

## 2021-06-05 MED ORDER — AZITHROMYCIN 250 MG PO TABS: ORAL_TABLET | ORAL | 0 refills | Status: DC

## 2021-06-05 MED ORDER — PREDNISONE 10 MG PO TABS: ORAL_TABLET | ORAL | 0 refills | Status: AC

## 2021-06-05 NOTE — Addendum Note (Signed)
Addended by: Konrad Felix L on: 06/05/2021 10:47 AM   Modules accepted: Orders

## 2021-06-05 NOTE — Addendum Note (Signed)
Addended by: Konrad Felix L on: 06/05/2021 02:02 PM   Modules accepted: Orders

## 2021-06-05 NOTE — Progress Notes (Signed)
OV 04/10/2015  Chief Complaint  Patient presents with   Follow-up    Pt states his breathing has worsened since last OV. Pt was admitted to [facility] for CP and syncope. Pt c/o increase in DOE, prod cough with dark brown mucus, chest disfomfort when active.    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 At that point in time spirometry showed a significant decline in forced vital capacity from 4 L at baseline to 2.6 L. He was using 4 L of oxygen at rest and 6 L with exertion at home. However we could not convincingly document hypoxemia with exertion on room air in the office at that time. Therefore to this and I ordered pulmonary function tests and CT scan of the chest. He did have CT scan of the chest in August 2016. There is no report on progression compared to February 2016 but I suspect the findings are similar. While he underwent pulmonary function test he had cough syncope and was admitted to the hospital. Cardiology notes reviewed and the final diagnosis was cough syncope. He he now presents for follow-up. He continues to be miserable with class IV dyspnea. He uses 4-6 liters of oxygen because he subjectively feels the need to do that. He does not monitor his pulse ox at home. He says that he continues to have cough syncope at home and has fallen several times. He has now moved to live with his 66 year old mom. He is socially isolated otherwise. He feels like he needs an extra layer of help at home. There are no other issues  Pulse oximetry on room air at rest and walking desaturation test 185 feet 3 laps on room air: RA rest after 15 min off o2, pulse o98% and lowest pulse ox with forehead probe was 90%. He did get dizzy with coughing   OV 06/05/15 65 year old man with tobacco abuse and history of interstitial lung disease,  desquamative interstitial pneumonitis and burnt out Langerhans' cell histiocytosis, hypoxemic respiratory failure; untreated OSA unable to tolerate CPAP. He probably also has some degree of superimposed COPD.  He presents today with two weeks of increased dyspnea, . He is having paroxysms of cough, can lead to pre-syncope / syncope. Last was 2 days ago. He has LE edema, probable more than his baseline. He is using duoneb bid, symbicort bid, albuterol prn - averages once a day. He complains of dry mouth. He took extra lasix after last OV here with TP, helped his edema but not his dyspnea.     OV 07/05/2015  Chief Complaint  Patient presents with   Follow-up    Pt states his SOB has worsened since last OV. Pt also c/o increase in nonprod cough, pt states he frequently coughs when eating or drinking. Pt c/o right lateral chest pain when coughing. Pt denies f/c/s.   Admit    65yo male smoker with  ILD(DIP)  on Oxygen .  Polysubstance abuse hx.   08/09/2015 [facility] follow up  Patient returns for a post hospital follow-up. Patient was recently admitted with acute on chronic respiratory failure with underlying interstitial lung disease for  acute on chronic diastolic heart failure. He improved with diuresis .  He says he is feeling improved. Has decreased shortness of breath. Unfortunately continues to smoke. We discussed smoking cessation. He denies any hemoptysis, chest pain, orthopnea, PND,  or increased leg swelling.    OV 10/14/2015  Chief Complaint  Patient presents with   Follow-up    Still having SOB, coughing and mild wheezing. Pt states that he feels like fluid is coming back especially on left side.    Follow-up interstitial lung disease and chronic hypoxemic respiratory failure associated with volume overload and diastolic dysfunction and coronary artery disease and multiple medical problems including depression and anxiety and poor social situation  He presents for routine  follow-up. During stable health times of November been able to document hypoxemia and him on room air at rest. Also during admissions he has been hypoxemic. Again today on room air 10 minutes his pulse ox was 97%. He insists that he gets very dyspneic when he turns his oxygen off and he needs 5 L of oxygen at all times. He does not check his pulse ox at home so we do not know his true oxygen levels when he is off oxygen. He has class III-for dyspnea. He is on oxygen. He lives with his mom was 73 years old and takes care of him. Overall he feels stable. [place] symptom assessment score documented below and it is obvious that is significant problems are fatigue, depression, anxiety and shortness of breath. He is open to undergo home palliative care program  [facility] Symptom Assessment Numerical Scale 0 is no problem -> 10 worst problem 10/14/2015    No Pain -> Worst pain 0  No Tiredness -> Worset tiredness 10  No Nausea -> Worst nausea 0  No Depression -> Worst depression 7  No Anxiety -> Worst Anxiety 8  No Drowsiness -> Worst Drowsiness 10  Best appetite-> Worst Appetitle 3  Best Feeling of well being -> Worst feeling 5  No dyspnea-> Worst dyspnea 8  Other problem (none -> severe) 10 - chest discomfort, when active  Completed by  patient         Last CT chest December 2016 and when compared to August 2016 no change in DIC pattern of ILD  Chest x-ray 10/04/2015: Reported as chronic changes or jaw baseline. I do not have these image for visualization  Lab work 07/26/2015: Shows a creatinine of 1.68 mg percent with a GFR of 43. Hemoglobin 13 g percent.  Cardiology visit with Dr. Domenic Polite 08/28/2015: There is discussion about switching his Lasix and metolazone and Demadex. Volume overload is likely multifactorial with RV dysfunction a significant component  Subjective:     Patient ID: [name], male   DOB: May 31, 1956, 65 y.o.   MRN: [id number]  HPI   OV 04/23/2017  Chief  Complaint  Patient presents with   Follow-up    Pt states that his breathing is worse than before. C/o SOB all the time, coughing, and chest pain. 6L O2 24/7.   Follow-up ILD chronic hypoxemic respiratory failure. He did have a high-resolution CT scan of the chest in September 2018. The shows mostly pulmonary edema and fluid pattern. I personally not seen in the last year and a half. He says that he continues to live with his mom. [facility] is visiting him 3 times a week and giving supportive care. He is on a lot of opioids and anxiolytics. He easily is nodding off in the exam room. He doesn't seem interested in giving me a good history. He has no insight into his disease. [place] symptom assessment score shows significant worsening of fatigue and pain and depression and overall high symptom  burden.   OV 03/10/2018  Chief Complaint  Patient presents with   Follow-up    Pt states he is having a lot of pain with his lungs and it is very unbearable. Pt also states his SOB is a lot worse, coughs all the time, has a lot of back pain, is very unsteady on his feet, and also has occ. CP.    [name] , 65 y.o. , with dob [birth year] and male ,Not Hispanic or Latino from [address] [place] [address] - presents to lung clinic for hronic hypoxemic respiratory failure that previously eyes seen was on the basis of ILD but CT scan last in 2018 showed that he did not have ILD. In fact he's had a follow-up CT scan of the chest May 2019 that does not show ILD. But he does have chronic hypoxemic respiratory failure. The basis of this is poorly understood but given his smoking history and 2016 pulmonary function test showing isolated reduction in diffusion capacity this would be all COPD/emphysema. He has missed many appointments and is very poor with history given his opioid dependence. Today he is a little bit more awake and he tells me that he continues to be on 5 L oxygen  nasal cannula. He says that he frequently dozes off and loses balance and falls.He also says for the last few months his lungs have been hurting but is no cough or congestion. He says he is on nebulizers but we do not have this in the med list. He does not know what nebulizers he is on. On exam today was significantly wheezing according to the medical assistant.   OV 08/01/2018  Chief Complaint  Patient presents with   Follow-up    Pt states his breathing has become worse since last visit, has a dry cough, has had chest pain, and states his lungs have hurt. Pt states he feels like his lungs are burning.      OV 08/01/2018  Subjective:  Patient ID: [name], male , DOB: 1956-03-20 , age 71 y.o. , MRN: [id number] , ADDRESS: [place] [address]   08/01/2018 -   Chief Complaint  Patient presents with   Follow-up    Pt states his breathing has become worse since last visit, has a dry cough, has had chest pain, and states his lungs have hurt. Pt states he feels like his lungs are burning.     HPI [name] 65 y.o. -presents for his follow-up of mixed emphysema with DIP/burned-out Langerhans' cell histiocytosis.  Since his last visit he got admitted November 2019 with acute on chronic hypoxemic respiratory failure.  In the interim he continues to smoke.  He is no longer in hospice but is requesting hospice back because he benefited from the extra layer of support of the palliative piece.  He thinks he might be dying but there is no evidence of that.  His primary care physician ordered ultrasound of the liver that is reported as diffuse hepatocellular disease not otherwise specified.  He will follow-up with [place] primary care physician about it.  Today try to requalify for oxygen.  We turned his oxygen off and walked him but even after 1 lap he became fatigued and wobbly and was at fall risk so we could not walk him any further.  His main issue is that he has significant  multiple symptomatology that includes significant fatigue and nonspecific abdominal pain and shortness of breath.  COPD  CAT score showed a symptom burden at 33 with fatigue at rated as very severe, sleep quality is very severe limitation in activities is very severe and dyspnea is very severe and chest tightness is very severe.  The only thing he does not have is any sputum production but he has a moderate cough.       OV 09/08/2018  Subjective:  Patient ID: [name], male , DOB: Dec 07, 1955 , age 7 y.o. , MRN: [id number] , ADDRESS: [place] [address]   09/08/2018 -   Chief Complaint  Patient presents with   Follow-up    PFT performed today. Pt states he has been worse since last visit. States he had the flu 1 week ago. Pt states his breathing is worse and has more fatigue than usual.   Interstitial lung disease with emphysema -surgical lung biopsy May 18, 2014 read in [place] as DIP with burned-out Langerhans' cell histiocytosis but reinterpreted February 2020 at [facility] as [facility] = smoking-related interstitial fibrosis associated with emphysema  Polypharmacy pain medications and heavy symptom burden  HPI [name] 65 y.o. -returns for follow-up.  He was seen approximately 1 month ago.  At that point in time we decided to re-stage and re-figure his interstitial lung disease.  I discussed his 46 pathology slide at our interstitial lung disease conference.  Our local pulmonary pathologist feels strongly that there is no evidence of Langerhan histiocytosis X.  Instead he has smoking-related interstitial fibrosis (SRIF) with associated emphysema.  On his latest high-resolution CT chest there is not much of a description of ILD even though he has crackles on exam.  Report as below and I personally visualized that CT.  He has a steady progression and worsening of pulmonary function test over 3 or 4 years as documented below.  However, he is not  hypoxemic.  He is continuing to use oxygen and is requesting oxygen for symptomatic relief of dyspnea.  However when he turned the oxygen off and walked him he is not hypoxemic at all.  His room air at rest pulse ox is 97% with heart rate of 88/min.  We walked him 2 laps and he stopped and his pulse ox dropped to 92% and his heart rate rose to 94%.  While he does drop his pulse ox by more than 3 points he is not going below 88%.  On the other hand he is becoming very symptomatic with severe shortness of breath dizziness and staggering.  Medical assistant notes that he is always nodding off.  We administered a [place] symptom assessment score and he has significant poly-symptom burden.  Medication review shows polypharmacy.  He is unable to quit any of these.    CT chest high resolution 08/11/2018 Lungs/Pleura: Postoperative changes of wedge resection are again noted in the left lower lobe and tracking along the left major fissure, similar to the prior study. However, compared to the prior examination there is greatly increased pleuroparenchymal thickening and architectural distortion in the mid to lower left hemithorax with extensive parenchymal banding throughout this region. These findings are markedly asymmetric compared to the contralateral side where there is only minimal linear scarring in the posterior aspect of the right lower lobe in the most dependent portion of the lung. High-resolution images otherwise demonstrate no widespread areas of ground-glass attenuation, septal thickening, traction bronchiectasis or honeycombing elsewhere in the lungs. Inspiratory and expiratory imaging demonstrates mild air trapping, most evident in the left lower lobe.  No acute consolidative airspace disease. No pleural effusions. Diffuse bronchial wall thickening with mild centrilobular and paraseptal emphysema.  IMPRESSION: 1. Although there are areas of increasing fibrosis throughout the mid to lower  lung, these findings are clearly asymmetric with the contralateral side and are favored to be related to developing areas of pleuroparenchymal scarring, potentially related to prior surgery. The pattern is not a usual pattern for any typical interstitial lung disease, and there is no involvement of the right lung. 2. Diffuse bronchial wall thickening with mild centrilobular and paraseptal emphysema; imaging findings compatible with the reported clinical history of underlying COPD. 3. Air trapping, most evident in the left lower lobe, indicative of small airways disease. 4. Aortic atherosclerosis, in addition to left main and 3 vessel coronary artery disease. Please note that although the presence of coronary artery calcium documents the presence of coronary artery disease, the severity of this disease and any potential stenosis cannot be assessed on this non-gated CT examination. Assessment for potential risk factor modification, dietary therapy or pharmacologic therapy may be warranted, if clinically indicated. 5. Severe hepatic steatosis. 6. Lipomatous hypertrophy of the interatrial septum.   Aortic Atherosclerosis (ICD10-I70.0) and Emphysema (ICD10-J43.9).     Electronically Signed   By: Vinnie Langton M.D.   On: 08/12/2018 08:29    OV 05/03/2019  Subjective:  Patient ID: [name], male , DOB: 06-19-1956 , age 59 y.o. , MRN: [id number] , ADDRESS: [place] [address]   Interstitial lung disease with emphysema -surgical lung biopsy May 18, 2014 read in [place] as DIP with burned-out Langerhans' cell histiocytosis but reinterpreted February 2020 at [facility] as [facility] = smoking-related interstitial fibrosis associated with emphysema  05/03/2019 -   Chief Complaint  Patient presents with   COPD with acute exacerbation    Feels his COPD has gotten worse since the televisit on 04/20/19    Fu mixed ILD and emphysema with chronic  respiratory failure    HPI [name] 65 y.o. -presents for follow-up.  Last seen by myself in early part of the year.  Since then has seen nurse practitioner 2 or 3 times through combination of physical visits and video/telephone visits.  1 of the times he is sent to the emergency department and admitted.  Last CT scan of the chest was in April 2020 and did not show any progression of his combination emphysema and ILD.  He had a blood gas at that time without any hypercapnia.  He tells me that he continues to live with his mom.  He uses between 6 and 8 L of oxygen and is stable.  He continues to have multiple symptoms including confusion early in the morning [he is noticed to be on a lot of pain meds] and chronic pain including pain in the right lateral chest.  In terms of his breathing he is stable.  Overall his quality of life and functional status is poor with an ECOG of 4.  Home palliative care used to visit with him but not anymore.  He wants the support again.  He is not interested in hospice.  He is asking for the shingles shot but we do not have this.  He will have his high-dose flu shot today.  Overall supportive care    Results for Kevin, [name] (MRN [id number]) as of 05/03/2019 10:53  Ref. Range 11/07/2018 17:17  pH, Ven Latest Ref Range: 7.250 - 7.430  7.373  pCO2, Ven Latest Ref  Range: 44.0 - 60.0 mmHg 40.6 (L)  pO2, Ven Latest Ref Range: 32.0 - 45.0 mmHg 63.0 (H)    IMPRESSION: Chest:   1. New small focal area of peripheral ground-glass density in the anterior right upper lobe is likely infectious or inflammatory. 2. Unchanged pleuroparenchymal scarring and fibrosis in the mid to lower left hemithorax. 3.  Emphysema (ICD10-J43.9). 4.  Aortic atherosclerosis (ICD10-I70.0).   Abdomen and pelvis:   1.  No acute intra-abdominal process. 2. Hepatic steatosis. 3. Prior right nephrectomy. 4. Bilateral femoral head avascular necrosis.     Electronically Signed   By:  Titus Dubin M.D.   On: 11/07/2018 23:33     OV 06/08/2019  Subjective:  Patient ID: [name], male , DOB: 04/06/1956 , age 34 y.o. , MRN: [id number] , ADDRESS: [place] [address]    Interstitial lung disease with emphysema -surgical lung biopsy May 18, 2014 read in [place] as DIP with burned-out Langerhans' cell histiocytosis but reinterpreted February 2020 at [facility] as [facility] = smoking-related interstitial fibrosis associated with emphysema  06/08/2019 -   Chief Complaint  Patient presents with   Hospitalization Follow-up    Pt in hosp 10/30-11/1.  Pt states he has had good days and bad days since being out of the hospital. Pt states he does have alot of SOB, has an occ cough due to getting choked, and has chest tightness.      Interstitial lung disease with emphysema -surgical lung biopsy May 18, 2014 read in [place] as DIP with burned-out Langerhans' cell histiocytosis but reinterpreted February 2020 at [facility] as [facility] = smoking-related interstitial fibrosis associated with emphysema  Chronic hypoxemic respiratory failure due to the above on 6 L of oxygen at baseline  Ongoing smoking  Ongoing polypharmacy and opioid use  HPI [name] 65 y.o. -last seen mid October 2020.  Then to his end of October 2020 he got admitted with sepsis elevated lactic acid.  Diagnosed to have pneumonia based on chest x-ray and then treated with antibiotics.  Discharge November 1 second 2020 on Augmentin.  He is now back to his baseline of 6 L oxygen and is beginning to feel well.  Nevertheless overall he still feels deconditioned.  As usual he says his lungs hurt.  This visit was set up mainly in the context of his recent admission.  Admission labs reviewed and in particular he seems to have developed hypercapnia at least he was hypercapnic in the hospital.  He is no longer using his CPAP.  Unclear why.  He is interested in getting this  back.  He is interested in BiPAP as well if he qualifies.  He continues to have a large symptom burden as documented below.  Last CT scan of the chest April 2020      Results for Kevin, [name] (MRN [id number]) as of 09/08/2018 11:46  Ref. Range 04/13/2014 11:43 09/07/2014 11:39 09/08/2018 10:21  FVC-Pre Latest Units: L 4.37 4.03 3.80  FVC-%Pred-Pre Latest Units: % 90 83 81   Results for Kevin, [name] (MRN [id number]) as of 09/08/2018 11:46  Ref. Range 04/13/2014 11:43 09/07/2014 11:39 09/08/2018 10:21  DLCO unc Latest Units: ml/min/mmHg 17.92 16.41 13.84  DLCO unc % pred Latest Units: % 55 50 51   Results for Kevin, [name] (MRN [id number]) as of 06/08/2019 11:39  Ref. Range 05/19/2019 15:00  pH, Arterial Latest Ref Range: 7.350 - 7.450  7.346 (L)  pCO2 arterial  Latest Ref Range: 32.0 - 48.0 mmHg 52.1 (H)  pO2, Arterial Latest Ref Range: 83.0 - 108.0 mmHg 73.5 (L)    OV 10/30/2019  Subjective:  Patient ID: [name], male , DOB: [birth year] , age 51 y.o. , MRN: [id number] , ADDRESS: [address] [place] [address]   10/30/2019 -   Chief Complaint  Patient presents with   Follow-up    Pt states he believes his breathing has become worse since last visit. States when he walks, he will begin to have pain in his lungs and chest.     Interstitial lung disease with emphysema -surgical lung biopsy May 18, 2014 read in [place] as DIP with burned-out Langerhans' cell histiocytosis but [facility]interpreted February 2020 [facility] as [facility] = smoking-related interstitial fibrosis associated with emphysema  Chronic hypoxemic respiratory failure due to the above on 6 L of oxygen at baseline  Ongoing smoking  Ongoing polypharmacy and opioid use   HPI [name] 65 y.o. -presents for follow-up.  After the last visit he had a work-up.  Blood gas did not show any hypercapnia.  High-resolution CT chest that showed emphysema.  No ILD.  His pulmonary function test though  shows decline.  Review of the chart indicates that he is in need of alpha-1 antitrypsin level checked.  He continues to have significant symptoms.  Symptom score is documented.  He says also for the last 1 month he is coughing discolored sputum that is a change from baseline.  The sputum is yellow in color.  He says cardiac etiology has been ruled out as reason for his dyspnea.  He is frustrated by his multiple symptomatology that includes pain in his breast and overall discomfort and exhaustion and tiredness.  We have struggled to fix all this.  He continues to have polypharmacy and smoking.  He lives with his mom.  PFT   Results for Kevin, [place] (MRN [id number]) as of 10/30/2019 09:44  Ref. Range 04/13/2014 11:43 09/07/2014 11:39 09/08/2018 10:21 09/04/2019 11:01  FVC-Pre Latest Units: L 4.37 4.03 3.80 3.47  FVC-%Pred-Pre Latest Units: % 90 83 81 74  Results for Kevin, [name] (MRN [id number]) as of 10/30/2019 09:44  Ref. Range 04/13/2014 11:43 09/07/2014 11:39 09/08/2018 10:21 09/04/2019 11:01  DLCO unc Latest Units: ml/min/mmHg 17.92 16.41 13.84 14.15  DLCO unc % pred Latest Units: % 55 50 51 52   ROS - per HPI  IMPRESSION: 1. Negative for interstitial lung disease. 2. Interval clearing of previously seen subpleural ground-glass in the anterior segment right upper lobe. 3. Aortic atherosclerosis (ICD10-I70.0). Coronary artery calcification. 4.  Emphysema (ICD10-J43.9).     Electronically Signed   By: Lorin Picket M.D.   On: 08/30/2019 16:20    OV 02/26/2021  Subjective:  Patient ID: [name], male , DOB: [birth year] , age 40 y.o. , MRN: [id number] , ADDRESS: [place] [address] PCP [name], PA-C (Inactive) Patient Care Team: [name], PA-C (Inactive) as PCP - General (Physician Assistant) Satira Sark, MD as PCP - Cardiology (Cardiology) [name], MD as Consulting Physician (Urology) Brand Males, MD as Consulting  Physician (Pulmonary Disease) [name], Stephanie G, NP (Inactive) as Nurse Practitioner (Hospice and Palliative Medicine) Julianne Handler, NP (Inactive) as Nurse Practitioner (Hospice and Palliative Medicine) Trula Slade, DPM as Consulting Physician (Podiatry)  This Provider for this visit: Treatment Team:  Attending Provider: Brand Males, MD    02/26/2021 -  Chief Complaint  Patient presents with   Follow-up    Pt states he has been about the same since last visit. States he does have an occ cough and breathing is about the same.    Interstitial lung disease with emphysema (alpha 1 MM) -surgical lung biopsy May 18, 2014 read in [place] as DIP with burned-out Langerhans' cell histiocytosis but reinterpreted February 2020 at [facility] as [facility] = smoking-related interstitial fibrosis associated with emphysema  Chronic hypoxemic respiratory failure due to the above on 6 L of oxygen at baseline  Ongoing smoking  Ongoing polypharmacy and opioid use  Non specific chronic msk chest pain  HPI  [name] -not seeing Milledge Gerding in over a year.  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  Review of the records indicate he is always wheezing.  He continues to live with his mom.  He is continues to use 6 L of oxygen.  He is on Bevespi inhaler.  He again complains that his lungs hurt in the back.  He had a CT scan of the chest in March 2022 and its mostly emphysema.  He also had a chest x-ray in June 2022 during a hospitalization and it was clear other than atelectasis.  He points to his right upper back in the intrascapular area as the place where it hurts.  He is to have home palliative care and he benefited from it but apparently they are not coming anymore.  He wants a referral.  He is awaiting to amputation.  He uses a cane.  He has chronic pain.  He is poor quality of life.  He did have COVID in May 2022.  Overall symptom assessment is that he appears to be at  baseline Results for Kevin, [name] (MRN [id number]) as of 10/30/2019 09:44  Ref. Range 06/12/2019 13:50  FIO2 Unknown 44.00  pH, Arterial Latest Ref Range: 7.350 - 7.450  7.410  pCO2 arterial Latest Ref Range: 32.0 - 48.0 mmHg 45.0  pO2, Arterial Latest Ref Range: 83.0 - 108.0 mmHg 93.1      HRCT March 2022   IMPRESSION: 1. No evidence of interstitial lung disease. Air trapping is indicative of small airways disease. 2. Pleuroparenchymal scarring and volume loss in the lower left hemithorax. 3. Aortic atherosclerosis (ICD10-I70.0). Coronary artery calcification. 4. Enlarged pulmonic trunk, indicative of pulmonary arterial hypertension. 5.  Emphysema (ICD10-J43.9).     Electronically Signed   By: Lorin Picket M.D.   On: 09/25/2020 08:19   CXR June 2022  IMPRESSION: Bibasilar atelectasis and chronic LEFT basilar scarring.   Enlargement of cardiac silhouette.   Aortic Atherosclerosis (ICD10-I70.0).     Electronically Signed   By: Lavonia Dana M.D.   On: 12/24/2020 16:18      OV 06/05/2021  Subjective:  Patient ID: [name], male , DOB: 11/16/1955 , age 65 y.o. , MRN: [id number] , ADDRESS: [address] [place] [address] PCP Carolee Rota, NP Patient Care Team: Carolee Rota, NP as PCP - General (Nurse Practitioner) Satira Sark, MD as PCP - Cardiology (Cardiology) [name], MD as Consulting Physician (Urology) Brand Males, MD as Consulting Physician (Pulmonary Disease) [name], Stephanie G, NP (Inactive) as Nurse Practitioner (Hospice and Palliative Medicine) Julianne Handler, NP (Inactive) as Nurse Practitioner (Hospice and Palliative Medicine) Trula Slade, DPM as Consulting Physician (Podiatry)  This Provider for this visit: Treatment Team:  Attending Provider: Brand Males, MD    Interstitial lung disease with  emphysema (alpha 1 MM) -surgical lung biopsy May 18, 2014 read in [place] as DIP with  burned-out Langerhans' cell histiocytosis but reinterpreted February 2020 at [facility] as [facility] = smoking-related interstitial fibrosis associated with emphysema  Chronic hypoxemic respiratory failure due to the above on 6 L of oxygen at baseline  Ongoing smoking  Ongoing polypharmacy and opioid use  Non specific chronic msk chest pai  06/05/2021 -   Chief Complaint  Patient presents with   Follow-up     HPI [name] 65 y.o. -returns for his 47-month follow-up.  In the interim he has had left foot partial amputation.  He says the wound has not healed.  He is going for dressing changes.  He continues to have multiple symptoms and generally have poor quality of life.  He lives with his mom who is now 19.  But the home health aide visits him and takes care of him.  He says he has had increased cough since June 2022, increase sputum and change in color since June 2022 and increased wheezing.  Because of the stress of the amputation he is smoking more.  He is now smoking half pack per day.  He feels he will benefit from antibiotic and prednisone which I gave him last time as well.  He says last time I ordered some tests on him.  I am not able to confirm that on my notes or on the review of the labs.  Nevertheless October 2022 he had new increase in creatinine.  He also had hyponatremia.  This is probably around the time of his amputation.  We need to recheck on these.    [place] Symptom Assessment Numerical Scale 0 is no problem -> 10 worst problem 10/14/2015   04/23/2017  08/01/2018  06/08/2019  10/30/2019  02/26/2021   No Pain -> Worst pain 0 7 9 9 7 8   No Tiredness -> Worset tiredness 10 9 10 10 10 10   No Nausea -> Worst nausea 0 0 0 3 2 0  No Depression -> Worst depression 7 10 5 9 9 8   No Anxiety -> Worst Anxiety 8 5 5 8 8 9   No Drowsiness -> Worst Drowsiness 10 3 (though was nodding off) 1 9 3 3   Best appetite-> Worst Appetitle 3 4 3 3 8 7   Best Feeling of well being  -> Worst feeling 5 7 1 4 8 7   Cough   8 9 10 2   No dyspnea-> Worst dyspnea 8 8 10 10 10 8   Other problem (none -> severe) 10 - chest discomfort, when active 7 leg pain 10 10 chest discomfort and other miscellaneous  Lungs  hurt in the back at time  Completed by  patient patient patient patient  pateient            CT Chest data  No results found.    PFT  PFT Results Latest Ref Rng & Units 09/04/2019 09/08/2018 09/07/2014 04/13/2014  FVC-Pre L 3.47 3.80 4.03 4.37  FVC-Predicted Pre % 74 81 83 90  FVC-Post L - - 4.29 4.43  FVC-Predicted Post % - - 89 92  Pre FEV1/FVC % % 77 76 81 82  Post FEV1/FCV % % - - 83 83  FEV1-Pre L 2.68 2.88 3.28 3.58  FEV1-Predicted Pre % 77 82 90 97  FEV1-Post L - - 3.58 3.67  DLCO uncorrected ml/min/mmHg 14.15 13.84 16.41 17.92  DLCO UNC% % 52 51 50 55  DLVA Predicted %  71 62 57 63  TLC L - - 6.36 6.11  TLC % Predicted % - - 90 87  RV % Predicted % - - 85 76       has a past medical history of Anxiety, Arthritis, Cholecystitis, acute (12/20/2013), Cocaine abuse ([place]), COPD (chronic obstructive pulmonary disease) ([place]), Coronary atherosclerosis of native coronary artery, Depression, Essential hypertension, GERD (gastroesophageal reflux disease), Headache, Hepatitis (Late 1970s), History of complete ray amputation of first toe of left foot ([place]) (04/24/2021), History of nephrolithiasis, History of pneumonia, History of stroke, Hyperlipidemia, Noncompliance, Osteomyelitis of second toe of left foot ([place]) (04/24/2021), Renal cell carcinoma ([place]), Sleep apnea, and Type 2 diabetes mellitus ([place]) (2011).   reports that he has been smoking cigarettes. He started smoking about 49 years ago. He has a 41.00 pack-year smoking history. He has never used smokeless tobacco.  Past Surgical History:  Procedure Laterality Date   AMPUTATION Left 12/27/2020   Procedure: PARTIAL FIRST RAY AMPUTATION OF FOOT;  Surgeon: Felipa Furnace, DPM;  Location: [place];  Service: Podiatry;   Laterality: Left;   APPENDECTOMY  1970's   CHOLECYSTECTOMY N/A 12/22/2013   Procedure: LAPAROSCOPIC CHOLECYSTECTOMY ;  Surgeon: [name], MD;  Location: [place];  Service: General;  Laterality: N/A;   ENDARTERECTOMY Right 10/08/2014   Procedure: Right ENDARTERECTOMY CAROTID;  Surgeon: [name], MD;  Location: [place];  Service: Vascular;  Laterality: Right;   LAPAROSCOPIC LYSIS OF ADHESIONS  02/21/2014   Procedure: LAPAROSCOPIC LYSIS OF ADHESIONS EXTINSIVE;  Surgeon: [name], MD;  Location: [facility];  Service: Urology;;   LEFT HEART CATHETERIZATION WITH CORONARY ANGIOGRAM N/A 06/02/2012   Procedure: LEFT HEART CATHETERIZATION WITH CORONARY ANGIOGRAM;  Surgeon: [name], MD;  [facility] C[facility] LAB;  Service: Cardiovascular;  Laterality: N/A;   LEFT HEART CATHETERIZATION WITH CORONARY ANGIOGRAM N/A 03/09/2013   Procedure: LEFT HEART CATHETERIZATION WITH CORONARY ANGIOGRAM;  Surgeon: [name], MD;  [facility] C[facility] LAB;  Service: Cardiovascular;  Laterality: N/A;   LEFT HEART CATHETERIZATION WITH CORONARY ANGIOGRAM N/A 12/20/2013   Procedure: LEFT HEART CATHETERIZATION WITH CORONARY ANGIOGRAM;  Surgeon: [name], MD;  Location: [facility] CATH LAB;  Service: Cardiovascular;  Laterality: N/A;   LUNG BIOPSY Left 05/18/2014   Procedure: LUNG BIOPSY left upper lobe & left lower lobe;  Surgeon: [name], MD;  Location: [place];  Service: Thoracic;  Laterality: Left;   PATCH ANGIOPLASTY Right 10/08/2014   Procedure: PATCH ANGIOPLASTY Right Carotid;  Surgeon: [name], MD;  Location: [place];  Service: Vascular;  Laterality: Right;   ROBOT ASSISTED LAPAROSCOPIC NEPHRECTOMY Right 02/21/2014   Procedure: ROBOTIC ASSISTED LAPAROSCOPIC RIGHT NEPHRECTOMY ;  Surgeon: [name], MD;  Location: [facility];  Service: Urology;  Laterality: Right;   VIDEO ASSISTED THORACOSCOPY Left 05/18/2014   Procedure: LEFT VIDEO ASSISTED THORACOSCOPY;  Surgeon: [name], MD;   Location: [place];  Service: Thoracic;  Laterality: Left;   VIDEO BRONCHOSCOPY  05/18/2014   Procedure: VIDEO BRONCHOSCOPY;  Surgeon: [name], MD;  Location: [place];  Service: Thoracic;;    Allergies  Allergen Reactions   Lisinopril Other (See Comments)    Other reaction(s): worsening kidney function   Oxycodone Itching and Nausea Only    Pt is able to tolerate if taken with benadryl    Immunization History  Administered Date(s) Administered   Fluad Quad(high Dose 65+) 05/03/2019   Influenza Split 04/04/2014, 03/27/2015   Influenza, High Dose Seasonal PF 05/03/2019,  07/29/2020   Influenza,inj,quad, With Preservative 04/02/2014, 03/19/2015   Moderna Sars-Covid-2 Vaccination 03/07/2020, 04/04/2020   Pneumococcal Polysaccharide-23 11/04/2012, 02/04/2021   Zoster, Live 02/05/2020, 05/09/2020    Family History  Problem Relation Age of Onset   Cancer Mother        Thyroid - living in her 45's.   Hypertension Other    Diabetes Other    Stroke Other    Lung cancer Father    CAD Father    Cancer Maternal Grandmother        Breast   Cancer Maternal Grandfather        Throat and stomach   CAD Brother      Current Outpatient Medications:    acetaminophen (TYLENOL) 325 MG tablet, Take 650 mg by mouth as needed (pain)., Disp: , Rfl:    ALPRAZolam (XANAX) 1 MG tablet, Take 1 mg by mouth 3 (three) times daily as needed., Disp: , Rfl:    atorvastatin (LIPITOR) 20 MG tablet, Take 20 mg by mouth at bedtime., Disp: , Rfl: 1   Budeson-Glycopyrrol-Formoterol (BREZTRI AEROSPHERE) 160-9-4.8 MCG/ACT AERO, Inhale 2 puffs into the lungs in the morning and at bedtime., Disp: 10.7 g, Rfl: 5   budesonide (PULMICORT) 1 MG/2ML nebulizer solution, Take 1 mg by nebulization as needed (shortness of breath)., Disp: , Rfl:    clopidogrel (PLAVIX) 75 MG tablet, TAKE ONE TABLET BY MOUTH EVERY DAY, Disp: 90 tablet, Rfl: 2   diltiazem (CARDIZEM CD) 180 MG 24 hr capsule, TAKE ONE CAPSULE BY MOUTH  EVERY DAY, Disp: 90 capsule, Rfl: 2   diphenhydrAMINE (BENADRYL) 25 mg capsule, Take 25 mg by mouth every 6 (six) hours as needed for itching (take with percocet)., Disp: , Rfl:    docusate sodium (COLACE) 100 MG capsule, Take 200 mg by mouth at bedtime. , Disp: , Rfl:    esomeprazole (NEXIUM) 40 MG capsule, Take 40 mg by mouth at bedtime. Pt only take as needed, Disp: , Rfl:    FLUoxetine (PROZAC) 20 MG capsule, Take by mouth., Disp: , Rfl:    FLUoxetine (PROZAC) 40 MG capsule, Take 40 mg by mouth daily. Take with 10mg  capsule for a total of 50mg  daily., Disp: , Rfl:    formoterol (PERFOROMIST) 20 MCG/2ML nebulizer solution, Take 20 mcg by nebulization as needed (shortness of breath)., Disp: , Rfl:    gabapentin (NEURONTIN) 300 MG capsule, Take 900 mg by mouth 3 (three) times daily., Disp: , Rfl:    glucose blood (TRUE METRIX BLOOD GLUCOSE TEST) test strip, , Disp: , Rfl:    insulin regular human CONCENTRATED (HUMULIN R) 500 UNIT/ML injection, Inject 90 Units into the skin 3 (three) times daily with meals., Disp: , Rfl:    isosorbide mononitrate (IMDUR) 60 MG 24 hr tablet, Take 1 tablet (60 mg total) by mouth daily., Disp: , Rfl:    linagliptin (TRADJENTA) 5 MG TABS tablet, Take 5 mg by mouth daily., Disp: , Rfl:    nitroGLYCERIN (NITROSTAT) 0.4 MG SL tablet, PLACE ONE tablet UNDER THE TONGUE every FIVE minutes as needed FOR CHEST PAIN. IF 3 TABLETS ARE NECESSARY CALL 911, Disp: 25 tablet, Rfl: 3   oxybutynin (DITROPAN-XL) 10 MG 24 hr tablet, Take 10 mg by mouth daily., Disp: , Rfl: 11   potassium chloride SA (K-DUR,KLOR-CON) 20 MEQ tablet, Take 20 mEq by mouth 2 (two) times daily. , Disp: , Rfl:    pregabalin (LYRICA) 25 MG capsule, Take 25 mg by mouth 2 (two) times daily., Disp: ,  Rfl:    Probiotic Product (PROBIOTIC PO), Take 1 capsule by mouth daily., Disp: , Rfl:    sucralfate (CARAFATE) 1 g tablet, TAKE ONE TABLET BY MOUTH TWICE DAILY ON an empty stomach, Disp: , Rfl:    tamsulosin (FLOMAX)  0.4 MG CAPS capsule, Take 0.4 mg by mouth daily. , Disp: , Rfl:    torsemide (DEMADEX) 20 MG tablet, TAKE THREE TABLETS BY MOUTH TWICE DAILY, Disp: 540 tablet, Rfl: 0   zolpidem (AMBIEN) 5 MG tablet, Take 5 mg by mouth at bedtime. For insomnia., Disp: , Rfl:    ALPRAZolam (XANAX) 0.5 MG tablet, Take 1 tablet (0.5 mg total) by mouth 3 (three) times daily as needed for anxiety. (Patient not taking: Reported on 06/05/2021), Disp: , Rfl: 0      Objective:   Vitals:   06/05/21 1009  BP: 122/80  Pulse: (!) 104  SpO2: 98%  Weight: 221 lb 9.6 oz (100.5 kg)  Height: 5\' 8"  (1.727 m)    Estimated body mass index is 33.69 kg/m as calculated from the following:   Height as of this encounter: 5\' 8"  (1.727 m).   Weight as of this encounter: 221 lb 9.6 oz (100.5 kg).  @WEIGHTCHANGE @  [employer]   06/05/21 1009  Weight: 221 lb 9.6 oz (100.5 kg)     Physical Exam   General: No distress. obese Neuro: Alert and Oriented x 3. GCS 15. Speech normal Psych: Pleasant Resp:  Barrel Chest - no.  Wheeze - YES, Crackles - no, No overt respiratory distress CVS: Normal heart sounds. Murmurs - no Ext: Stigmata of Connective Tissue Disease - no HEENT: Normal upper airway. PEERL +. No post nasal drip        Assessment:       ICD-10-CM   1. Chronic obstructive pulmonary disease, unspecified COPD type ([place])  J44.9     2. Chronic respiratory failure with hypoxia ([name])  J96.11     3. Other emphysema ([place])  J43.8     4. Tobacco abuse  Z72.0     5. Chronic kidney disease, unspecified CKD stage  N18.9          Plan:     Patient Instructions     ICD-10-CM   1. Chronic obstructive pulmonary disease, unspecified COPD type ([place])  J44.9     2. Chronic respiratory failure with hypoxia ([name])  J96.11     3. Other emphysema ([place])  J43.8     4. Tobacco abuse  Z72.0     5. Chronic kidney disease, unspecified CKD stage  N18.9        On your latest CT scan there is no more evidence of  interstitial lung disease/pulmonary fibrosis. Instead the dominant feature is emphysema  Currently might be in a bit of COPD flareup or emphysema flareup- but could just be that you have baseline wheezing  Overall shortness of breath is multifactorial and this includes emphysema, weight, physical deconditioning and other reasons  Not sure what test I ran since last visit in august 2022 visit - seems like I did not run any additonal tests. Still oct 2022 creatinine done by others shows elevation and sodium is low  Plan - stop smoking -- Please take prednisone 40 mg x1 day, then 30 mg x1 day, then 20 mg x1 day, then 10 mg x1 day, and then 5 mg x1 day and stop - Z pak --Continue oxygen as before -Contnue BREZTRI inhaler  2 puff twice daily =-  Other prn nebulizers  as before - check cbc, bmet, lft 06/05/2021    Follow-up -6 months or sooner if needed  - ESAS symptom score at followup    SIGNATURE    Dr. Brand Males, M.D., F.C.C.P,  Pulmonary and Critical Care Medicine Staff Physician, [place] Director - Interstitial Lung Disease  Program  Pulmonary [place] [facility] [place], [place], [address]  Pager: [phone], If no answer or between  15:00h - 7:00h: call [phone] Telephone: [phone]  10:43 AM 06/05/2021

## 2021-06-05 NOTE — Addendum Note (Signed)
Addended by: Elby Beck R on: 06/05/2021 03:37 PM   Modules accepted: Orders

## 2021-06-05 NOTE — Patient Instructions (Addendum)
ICD-10-CM   1. Chronic obstructive pulmonary disease, unspecified COPD type ([place])  J44.9     2. Chronic respiratory failure with hypoxia ([name])  J96.11     3. Other emphysema ([place])  J43.8     4. Tobacco abuse  Z72.0     5. Chronic kidney disease, unspecified CKD stage  N18.9        On your latest CT scan there is no more evidence of interstitial lung disease/pulmonary fibrosis. Instead the dominant feature is emphysema  Currently might be in a bit of COPD flareup or emphysema flareup- but could just be that you have baseline wheezing  Overall shortness of breath is multifactorial and this includes emphysema, weight, physical deconditioning and other reasons  Not sure what test I ran since last visit in august 2022 visit - seems like I did not run any additonal tests. Still oct 2022 creatinine done by others shows elevation and sodium is low  Plan - stop smoking -- Please take prednisone 40 mg x1 day, then 30 mg x1 day, then 20 mg x1 day, then 10 mg x1 day, and then 5 mg x1 day and stop - Z pak --Continue oxygen as before -Contnue BREZTRI inhaler  2 puff twice daily =- Other prn nebulizers  as before - check cbc, bmet, lft 06/05/2021    Follow-up -6 months or sooner if needed  - ESAS symptom score at followup

## 2021-06-05 NOTE — Addendum Note (Signed)
Addended by: Konrad Felix L on: 06/05/2021 01:55 PM   Modules accepted: Orders

## 2021-06-06 ENCOUNTER — Ambulatory Visit: Payer: Medicare Other | Admitting: Infectious Disease

## 2021-06-06 DIAGNOSIS — G4730 Sleep apnea, unspecified: Secondary | ICD-10-CM | POA: Diagnosis not present

## 2021-06-06 DIAGNOSIS — M1990 Unspecified osteoarthritis, unspecified site: Secondary | ICD-10-CM | POA: Diagnosis not present

## 2021-06-06 DIAGNOSIS — K219 Gastro-esophageal reflux disease without esophagitis: Secondary | ICD-10-CM | POA: Diagnosis not present

## 2021-06-06 DIAGNOSIS — F32A Depression, unspecified: Secondary | ICD-10-CM | POA: Diagnosis not present

## 2021-06-06 DIAGNOSIS — E1142 Type 2 diabetes mellitus with diabetic polyneuropathy: Secondary | ICD-10-CM | POA: Diagnosis not present

## 2021-06-06 DIAGNOSIS — J449 Chronic obstructive pulmonary disease, unspecified: Secondary | ICD-10-CM | POA: Diagnosis not present

## 2021-06-06 DIAGNOSIS — Z79891 Long term (current) use of opiate analgesic: Secondary | ICD-10-CM | POA: Diagnosis not present

## 2021-06-06 DIAGNOSIS — I10 Essential (primary) hypertension: Secondary | ICD-10-CM | POA: Diagnosis not present

## 2021-06-06 DIAGNOSIS — Z905 Acquired absence of kidney: Secondary | ICD-10-CM | POA: Diagnosis not present

## 2021-06-06 DIAGNOSIS — Z9181 History of falling: Secondary | ICD-10-CM | POA: Diagnosis not present

## 2021-06-06 DIAGNOSIS — F17210 Nicotine dependence, cigarettes, uncomplicated: Secondary | ICD-10-CM | POA: Diagnosis not present

## 2021-06-06 DIAGNOSIS — Z4781 Encounter for orthopedic aftercare following surgical amputation: Secondary | ICD-10-CM | POA: Diagnosis not present

## 2021-06-06 DIAGNOSIS — E1169 Type 2 diabetes mellitus with other specified complication: Secondary | ICD-10-CM | POA: Diagnosis not present

## 2021-06-06 DIAGNOSIS — M86172 Other acute osteomyelitis, left ankle and foot: Secondary | ICD-10-CM | POA: Diagnosis not present

## 2021-06-06 DIAGNOSIS — Z89432 Acquired absence of left foot: Secondary | ICD-10-CM | POA: Diagnosis not present

## 2021-06-06 DIAGNOSIS — Z4801 Encounter for change or removal of surgical wound dressing: Secondary | ICD-10-CM | POA: Diagnosis not present

## 2021-06-06 DIAGNOSIS — Z91199 Patient's noncompliance with other medical treatment and regimen due to unspecified reason: Secondary | ICD-10-CM | POA: Diagnosis not present

## 2021-06-06 DIAGNOSIS — I2510 Atherosclerotic heart disease of native coronary artery without angina pectoris: Secondary | ICD-10-CM | POA: Diagnosis not present

## 2021-06-06 DIAGNOSIS — Z7902 Long term (current) use of antithrombotics/antiplatelets: Secondary | ICD-10-CM | POA: Diagnosis not present

## 2021-06-06 DIAGNOSIS — Z7984 Long term (current) use of oral hypoglycemic drugs: Secondary | ICD-10-CM | POA: Diagnosis not present

## 2021-06-06 DIAGNOSIS — C641 Malignant neoplasm of right kidney, except renal pelvis: Secondary | ICD-10-CM | POA: Diagnosis not present

## 2021-06-09 ENCOUNTER — Other Ambulatory Visit: Payer: Self-pay

## 2021-06-09 ENCOUNTER — Encounter (HOSPITAL_BASED_OUTPATIENT_CLINIC_OR_DEPARTMENT_OTHER): Payer: Medicare Other | Admitting: Internal Medicine

## 2021-06-09 DIAGNOSIS — Z794 Long term (current) use of insulin: Secondary | ICD-10-CM | POA: Diagnosis not present

## 2021-06-09 DIAGNOSIS — T8131XA Disruption of external operation (surgical) wound, not elsewhere classified, initial encounter: Secondary | ICD-10-CM | POA: Diagnosis not present

## 2021-06-09 DIAGNOSIS — L97528 Non-pressure chronic ulcer of other part of left foot with other specified severity: Secondary | ICD-10-CM

## 2021-06-09 DIAGNOSIS — E11621 Type 2 diabetes mellitus with foot ulcer: Secondary | ICD-10-CM

## 2021-06-09 DIAGNOSIS — J449 Chronic obstructive pulmonary disease, unspecified: Secondary | ICD-10-CM | POA: Diagnosis not present

## 2021-06-09 DIAGNOSIS — Z89412 Acquired absence of left great toe: Secondary | ICD-10-CM | POA: Diagnosis not present

## 2021-06-09 DIAGNOSIS — F17210 Nicotine dependence, cigarettes, uncomplicated: Secondary | ICD-10-CM | POA: Diagnosis not present

## 2021-06-09 DIAGNOSIS — E1165 Type 2 diabetes mellitus with hyperglycemia: Secondary | ICD-10-CM | POA: Diagnosis not present

## 2021-06-09 DIAGNOSIS — Z9981 Dependence on supplemental oxygen: Secondary | ICD-10-CM | POA: Diagnosis not present

## 2021-06-09 NOTE — Progress Notes (Signed)
MARQUES ERICSON ([id number]) , Visit Report for 06/09/2021 Chief Complaint Document Details Patient Name: Date of Service: KIMO, BANCROFT 06/09/2021 12:45 PM Medical Record Number: [id number] Patient Account Number: [id number] Date of Birth/Sex: Treating RN: [birth year] (65 y.o. [name] Primary Care Provider: Carolee Rota Other Clinician: Referring Provider: Treating Provider/Extender: Maryagnes Amos in Treatment: 13 Information Obtained from: Patient Chief Complaint Nonhealing wound secondary to Wound dehiscence status post transmetatarsal amputation on the left Electronic Signature(s) Signed: 06/09/2021 2:15:37 PM By: Kalman Shan DO Entered By: Kalman Shan on 06/09/2021 14:13:01 -------------------------------------------------------------------------------- Debridement Details Patient Name: Date of Service: [name]. 06/09/2021 12:45 PM Medical Record Number: [id number] Patient Account Number: [id number] Date of Birth/Sex: Treating RN: 10-Feb-1956 (65 y.o. [name] Primary Care Provider: Carolee Rota Other Clinician: Referring Provider: Treating Provider/Extender: Maryagnes Amos in Treatment: 13 Debridement Performed for Assessment: Wound #7 Left Amputation Site - Transmetatarsal Performed By: Physician Kalman Shan, DO Debridement Type: Debridement Level of Consciousness (Pre-procedure): Awake and Alert Pre-procedure Verification/Time Out Yes - 13:49 Taken: Start Time: 13:50 T Area Debrided (L x W): otal 1 (cm) x 5.2 (cm) = 5.2 (cm) Tissue and other material debrided: Non-Viable, Slough, Subcutaneous, Slough Level: Skin/Subcutaneous Tissue Debridement Description: Excisional Instrument: Curette Bleeding: Minimum Hemostasis Achieved: Pressure End Time: 13:55 Response to Treatment: Procedure was tolerated well Level of Consciousness (Post- Awake and Alert procedure): Post  Debridement Measurements of Total Wound Length: (cm) 1 Width: (cm) 5.2 Depth: (cm) 0.9 Volume: (cm) 3.676 Character of Wound/Ulcer Post Debridement: Stable Post Procedure Diagnosis Same as Pre-procedure Electronic Signature(s) Signed: 06/09/2021 2:15:37 PM By: Kalman Shan DO Signed: 06/09/2021 4:56:39 PM By: Lorrin Jackson Entered By: Lorrin Jackson on 06/09/2021 13:57:57 -------------------------------------------------------------------------------- HPI Details Patient Name: Date of Service: [name]. 06/09/2021 12:45 PM Medical Record Number: [id number] Patient Account Number: [id number] Date of Birth/Sex: Treating RN: [birth year] (65 y.o. [name] Primary Care Provider: Carolee Rota Other Clinician: Referring Provider: Treating Provider/Extender: Maryagnes Amos in Treatment: 13 History of Present Illness HPI Description: Admission 8/18 Mr. Jp Eastham is a 65 year old male with a past medical history of uncontrolled insulin-dependent type 2 diabetes, COPD On O2 6 L via nasal cannula That presents to the clinic for a 48-month history of nonhealing wound to the left great toe amputation site. He has been following with podiatry for a wound to his left great toe that developed osteomyelitis and subsequently required amputation. He had the surgery done on 6/10. He had a partial first ray amputation by Dr. Posey Pronto. Since his surgery he has been placing iodine on the wound bed. He states that there has been no improvement to the wound healing in the past 2 months. He states he has occasional intermittent pain to the area. He denies systemic signs of infection. 8/25; patient presents for 1 week follow-up. He states he has been using silver alginate daily without issues. He has no complaints today. He denies signs of infection. He is scheduled to have his MRI on 8/27. 9/15; patient presents for follow-up. He missed his last clinic appointment  due to feeling ill. He continues to use silver alginate daily without any issues. He reports improvement in wound healing. He currently denies signs of infection. 9/27; patient presents for follow-up. He has developed a new wound to the second left toe. He is unaware of this and was noted on exam today. He continues to use silver alginate to the amputation site wound. He has  no issues or complaints today. He currently denies signs of infection. 10/4; patient presents for follow-up. He is using silver alginate to the wound sites. He has no issues or complaints today. He denies systemic signs of infection. 10/10; patient presents for follow-up. He has been using silver alginate to the wound sites. He states that his foot became more swollen over the weekend, But has since improved. He overall feels okay. Admission 06/02/2021 Patient presents for nonhealing wound secondary to wound dehiscence following a transmetatarsal amputation. He states he has been using Betadine wet-to- dry dressings to the wound bed. He states the wound has been open for the past 3 weeks. He currently denies Pain or signs of infection. 11/21; patient presents for follow-up. He has home health but states this will end in 2 weeks per insurance. He has no issues or complaints today. He denies signs of infection. Electronic Signature(s) Signed: 06/09/2021 2:15:37 PM By: Kalman Shan DO Entered By: Kalman Shan on 06/09/2021 14:13:30 -------------------------------------------------------------------------------- Physical Exam Details Patient Name: Date of Service: ROOSEVELT, EIMERS 06/09/2021 12:45 PM Medical Record Number: [id number] Patient Account Number: [id number] Date of Birth/Sex: Treating RN: 1955-08-17 (65 y.o. [name] Primary Care Provider: Carolee Rota Other Clinician: Referring Provider: Treating Provider/Extender: Maryagnes Amos in Treatment:  13 Constitutional respirations regular, non-labored and within target range for patient.. Cardiovascular 2+ dorsalis pedis/posterior tibialis pulses. Psychiatric pleasant and cooperative. Notes Left foot: Transmetatarsal amputation. Wound dehiscence at the surgical site. Nonviable tissue throughout the wound bed. No purulent drainage. No signs of infection. Electronic Signature(s) Signed: 06/09/2021 2:15:37 PM By: Kalman Shan DO Entered By: Kalman Shan on 06/09/2021 14:13:57 -------------------------------------------------------------------------------- Physician Orders Details Patient Name: Date of Service: [name]. 06/09/2021 12:45 PM Medical Record Number: [id number] Patient Account Number: [id number] Date of Birth/Sex: Treating RN: 1955/11/24 (65 y.o. [name] Primary Care Provider: Carolee Rota Other Clinician: Referring Provider: Treating Provider/Extender: Maryagnes Amos in Treatment: 13 Verbal / Phone Orders: No Diagnosis Coding ICD-10 Coding Code Description 8584658613 Non-pressure chronic ulcer of other part of left foot with other specified severity T81.31XA Disruption of external operation (surgical) wound, not elsewhere classified, initial encounter E11.621 Type 2 diabetes mellitus with foot ulcer J44.9 Chronic obstructive pulmonary disease, unspecified Follow-up Appointments ppointment in 1 week. - Dr. Heber Silex Return A Bathing/ Shower/ Hygiene May shower and wash wound with soap and water. - with dressing changes only. Edema Control - Lymphedema / SCD / Other Elevate legs to the level of the heart or above for 30 minutes daily and/or when sitting, a frequency of: - throughout the day Avoid standing for long periods of time. Moisturize legs daily. - both legs and feet every night before bed. Off-Loading DH Walker Boot to: - left foot per surgeon Additional Orders / Instructions Follow Nutritious Diet - Monitor  and keep blood sugars well controlled Home Health No change in wound care orders this week; continue Home Health for wound care. May utilize formulary equivalent dressing for wound treatment orders unless otherwise specified. Other Home Health Orders/Instructions: - [facility] Wound Treatment Wound #7 - Amputation Site - Transmetatarsal Wound Laterality: Left Cleanser: Soap and Water [facility]) Every Other Day/30 Days Discharge Instructions: May shower and wash wound with dial antibacterial soap and water prior to dressing change. Cleanser: Wound Cleanser [facility]) Every Other Day/30 Days Discharge Instructions: Cleanse the wound with wound cleanser prior to applying a clean dressing using gauze sponges, not tissue or cotton balls. Prim  Dressing: IODOFLEX 0.9% Cadexomer Iodine Pad 4x6 cm [facility]) Every Other Day/30 Days ary Discharge Instructions: Or Iodosorb if HH cannot get Iodoflex Secondary Dressing: Woven Gauze Sponge, Non-Sterile 4x4 in [facility]) Every Other Day/30 Days Discharge Instructions: Apply over primary dressing as directed. Secondary Dressing: ABD Pad, 5x9 [facility]) Every Other Day/30 Days Discharge Instructions: Apply over primary dressing as directed. Secured With: Elastic Bandage 4 inch (ACE bandage) (Home Health) Every Other Day/30 Days Discharge Instructions: Secure with ACE bandage as directed. Secured With: [employer], 4.5x3.1 (in/yd) [facility]) Every Other Day/30 Days Discharge Instructions: Secure with Kerlix as directed. Electronic Signature(s) Signed: 06/09/2021 2:15:37 PM By: Kalman Shan DO Entered By: Kalman Shan on 06/09/2021 14:14:11 -------------------------------------------------------------------------------- Problem List Details Patient Name: Date of Service: [name]. 06/09/2021 12:45 PM Medical Record Number: [id number] Patient Account Number: [id number] Date of Birth/Sex: Treating  RN: 1955-08-28 (65 y.o. [name] Primary Care Provider: Carolee Rota Other Clinician: Referring Provider: Treating Provider/Extender: Maryagnes Amos in Treatment: 13 Active Problems ICD-10 Encounter Code Description Active Date MDM Diagnosis L97.528 Non-pressure chronic ulcer of other part of left foot with other specified 06/02/2021 No Yes severity T81.31XA Disruption of external operation (surgical) wound, not elsewhere classified, 06/02/2021 No Yes initial encounter E11.621 Type 2 diabetes mellitus with foot ulcer 03/06/2021 No Yes J44.9 Chronic obstructive pulmonary disease, unspecified 03/06/2021 No Yes Inactive Problems ICD-10 Code Description Active Date Inactive Date S91.302A Unspecified open wound, left foot, initial encounter 03/06/2021 03/06/2021 Resolved Problems Electronic Signature(s) Signed: 06/09/2021 2:15:37 PM By: Kalman Shan DO Entered By: Kalman Shan on 06/09/2021 14:12:42 -------------------------------------------------------------------------------- Progress Note Details Patient Name: Date of Service: [name]. 06/09/2021 12:45 PM Medical Record Number: [id number] Patient Account Number: [id number] Date of Birth/Sex: Treating RN: 1955-09-22 (65 y.o. [name] Primary Care Provider: Carolee Rota Other Clinician: Referring Provider: Treating Provider/Extender: Maryagnes Amos in Treatment: 13 Subjective Chief Complaint Information obtained from Patient Nonhealing wound secondary to Wound dehiscence status post transmetatarsal amputation on the left History of Present Illness (HPI) Admission 8/18 Mr. Del Overfelt is a 65 year old male with a past medical history of uncontrolled insulin-dependent type 2 diabetes, COPD On O2 6 L via nasal cannula That presents to the clinic for a 23-month history of nonhealing wound to the left great toe amputation site. He has been following  with podiatry for a wound to his left great toe that developed osteomyelitis and subsequently required amputation. He had the surgery done on 6/10. He had a partial first ray amputation by Dr. Posey Pronto. Since his surgery he has been placing iodine on the wound bed. He states that there has been no improvement to the wound healing in the past 2 months. He states he has occasional intermittent pain to the area. He denies systemic signs of infection. 8/25; patient presents for 1 week follow-up. He states he has been using silver alginate daily without issues. He has no complaints today. He denies signs of infection. He is scheduled to have his MRI on 8/27. 9/15; patient presents for follow-up. He missed his last clinic appointment due to feeling ill. He continues to use silver alginate daily without any issues. He reports improvement in wound healing. He currently denies signs of infection. 9/27; patient presents for follow-up. He has developed a new wound to the second left toe. He is unaware of this and was noted on exam today. He continues to use silver alginate to the amputation site wound. He  has no issues or complaints today. He currently denies signs of infection. 10/4; patient presents for follow-up. He is using silver alginate to the wound sites. He has no issues or complaints today. He denies systemic signs of infection. 10/10; patient presents for follow-up. He has been using silver alginate to the wound sites. He states that his foot became more swollen over the weekend, But has since improved. He overall feels okay. Admission 06/02/2021 Patient presents for nonhealing wound secondary to wound dehiscence following a transmetatarsal amputation. He states he has been using Betadine wet-to- dry dressings to the wound bed. He states the wound has been open for the past 3 weeks. He currently denies Pain or signs of infection. 11/21; patient presents for follow-up. He has home health but states this  will end in 2 weeks per insurance. He has no issues or complaints today. He denies signs of infection. Patient History Information obtained from Patient. Family History Cancer - Maternal Grandparents,Paternal Grandparents,Father,Mother, Diabetes - Mother,Father, Heart Disease - Maternal Grandparents,Mother,Father, Kidney Disease - Maternal Grandparents, Lung Disease - Father, Stroke - Paternal Grandparents,Maternal Grandparents,Mother, No family history of Hereditary Spherocytosis, Hypertension, Seizures, Thyroid Problems, Tuberculosis. Social History Current every day smoker - 1/2 pack a day, Alcohol Use - Never, Drug Use - No History, Caffeine Use - Daily - coffee, tea. Medical History Respiratory Patient has history of Chronic Obstructive Pulmonary Disease (COPD), Sleep Apnea - does not use CPAP Cardiovascular Patient has history of Coronary Artery Disease - mx vessel, Hypertension Gastrointestinal Denies history of Cirrhosis , Colitis, Crohnoos, Hepatitis A, Hepatitis B, Hepatitis C Endocrine Patient has history of Type II Diabetes - last A1c - 13.3 Hospitalization/Surgery History - appendectomy. - cholecystectomy. - laproscopic nephrectomy. - lapro lysis of lesions. - thorascopy. - lung biopsy. - bronchoscopy. - (L) heart cath with angio X3. - endarcdectomy. - patch angioplasty. - left great toe amp. December 27, 2020. Medical A Surgical History Notes nd Constitutional Symptoms (General Health) noncompliance , cocaine abuse ,hepatitis ,headaches Respiratory h/o pneumonia , end stage chronic lung disease , COPD/ILD Cardiovascular h/o CVA , hyperlipidemia , coronary atherosclerosis , acute on chronic diastolic heart failure LVEF 60%-65% Gastrointestinal cholecystitis , GERD Genitourinary h/o nephrolithiasis , h/o renal cell carcinoma , CKD Stage 3 Musculoskeletal arthritis Psychiatric anxiety , depression Objective Constitutional respirations regular, non-labored and within  target range for patient.. Vitals Time Taken: 1:37 PM, Temperature: 97.6 F, Pulse: 105 bpm, Respiratory Rate: 18 breaths/min, Blood Pressure: 103/67 mmHg, Capillary Blood Glucose: 350 mg/dl. Cardiovascular 2+ dorsalis pedis/posterior tibialis pulses. Psychiatric pleasant and cooperative. General Notes: Left foot: Transmetatarsal amputation. Wound dehiscence at the surgical site. Nonviable tissue throughout the wound bed. No purulent drainage. No signs of infection. Integumentary (Hair, Skin) Wound #7 status is Open. Original cause of wound was Surgical Injury. The date acquired was: 05/20/2021. The wound has been in treatment 1 weeks. The wound is located on the Left Amputation Site - Transmetatarsal. The wound measures 1cm length x 5.2cm width x 0.9cm depth; 4.084cm^2 area and 3.676cm^3 volume. There is Fat Layer (Subcutaneous Tissue) exposed. There is no tunneling or undermining noted. There is a medium amount of serosanguineous drainage noted. Foul odor after cleansing was noted. The wound margin is well defined and not attached to the wound base. There is medium (34-66%) red granulation within the wound bed. There is a medium (34-66%) amount of necrotic tissue within the wound bed including Adherent Slough. Assessment Active Problems ICD-10 Non-pressure chronic ulcer of other part of left foot with  other specified severity Disruption of external operation (surgical) wound, not elsewhere classified, initial encounter Type 2 diabetes mellitus with foot ulcer Chronic obstructive pulmonary disease, unspecified Patient's wound is stable. I debrided nonviable tissue. No signs of infection on exam. I recommended continuing Iodoflex. Once this area is cleaned up he may do well with a wound VAC. Follow-up in 1 week. Procedures Wound #7 Pre-procedure diagnosis of Wound #7 is a Dehisced Wound located on the Left Amputation Site - Transmetatarsal . There was a Excisional Skin/Subcutaneous Tissue  Debridement with a total area of 5.2 sq cm performed by Kalman Shan, DO. With the following instrument(s): Curette to remove Non-Viable tissue/material. Material removed includes Subcutaneous Tissue and Slough and. No specimens were taken. A time out was conducted at 13:49, prior to the start of the procedure. A Minimum amount of bleeding was controlled with Pressure. The procedure was tolerated well. Post Debridement Measurements: 1cm length x 5.2cm width x 0.9cm depth; 3.676cm^3 volume. Character of Wound/Ulcer Post Debridement is stable. Post procedure Diagnosis Wound #7: Same as Pre-Procedure Plan Follow-up Appointments: Return Appointment in 1 week. - Dr. Heber [place] Bathing/ Shower/ Hygiene: May shower and wash wound with soap and water. - with dressing changes only. Edema Control - Lymphedema / SCD / Other: Elevate legs to the level of the heart or above for 30 minutes daily and/or when sitting, a frequency of: - throughout the day Avoid standing for long periods of time. Moisturize legs daily. - both legs and feet every night before bed. Off-Loading: DH Walker Boot to: - left foot per surgeon Additional Orders / Instructions: Follow Nutritious Diet - Monitor and keep blood sugars well controlled Home Health: No change in wound care orders this week; continue Home Health for wound care. May utilize formulary equivalent dressing for wound treatment orders unless otherwise specified. Other Home Health Orders/Instructions: - [place] #7: - Amputation Site - Transmetatarsal Wound Laterality: Left Cleanser: Soap and Water [facility]) Every Other Day/30 Days Discharge Instructions: May shower and wash wound with dial antibacterial soap and water prior to dressing change. Cleanser: Wound Cleanser [facility]) Every Other Day/30 Days Discharge Instructions: Cleanse the wound with wound cleanser prior to applying a clean dressing using gauze sponges, not tissue or cotton  balls. Prim Dressing: IODOFLEX 0.9% Cadexomer Iodine Pad 4x6 cm [facility]) Every Other Day/30 Days ary Discharge Instructions: Or Iodosorb if HH cannot get Iodoflex Secondary Dressing: Woven Gauze Sponge, Non-Sterile 4x4 in [facility]) Every Other Day/30 Days Discharge Instructions: Apply over primary dressing as directed. Secondary Dressing: ABD Pad, 5x9 [facility]) Every Other Day/30 Days Discharge Instructions: Apply over primary dressing as directed. Secured With: Elastic Bandage 4 inch (ACE bandage) (Home Health) Every Other Day/30 Days Discharge Instructions: Secure with ACE bandage as directed. Secured With: [employer], 4.5x3.1 (in/yd) [facility]) Every Other Day/30 Days Discharge Instructions: Secure with Kerlix as directed. 1. In office sharp debridement 2. Iodoflex 3. Follow-up in 1 week Electronic Signature(s) Signed: 06/09/2021 2:15:37 PM By: Kalman Shan DO Entered By: Kalman Shan on 06/09/2021 14:15:06 -------------------------------------------------------------------------------- HxROS Details Patient Name: Date of Service: [name]. 06/09/2021 12:45 PM Medical Record Number: [id number] Patient Account Number: [id number] Date of Birth/Sex: Treating RN: July 06, 1956 (65 y.o. [name] Primary Care Provider: Carolee Rota Other Clinician: Referring Provider: Treating Provider/Extender: Maryagnes Amos in Treatment: 13 Information Obtained From Patient Constitutional Symptoms (General Health) Medical History: Past Medical History Notes: noncompliance , cocaine abuse ,hepatitis ,headaches Respiratory  Medical History: Positive for: Chronic Obstructive Pulmonary Disease (COPD); Sleep Apnea - does not use CPAP Past Medical History Notes: h/o pneumonia , end stage chronic lung disease , COPD/ILD Cardiovascular Medical History: Positive for: Coronary Artery Disease - mx vessel; Hypertension Past  Medical History Notes: h/o CVA , hyperlipidemia , coronary atherosclerosis , acute on chronic diastolic heart failure LVEF 60%-65% Gastrointestinal Medical History: Negative for: Cirrhosis ; Colitis; Crohns; Hepatitis A; Hepatitis B; Hepatitis C Past Medical History Notes: cholecystitis , GERD Endocrine Medical History: Positive for: Type II Diabetes - last A1c - 13.3 Genitourinary Medical History: Past Medical History Notes: h/o nephrolithiasis , h/o renal cell carcinoma , CKD Stage 3 Musculoskeletal Medical History: Past Medical History Notes: arthritis Psychiatric Medical History: Past Medical History Notes: anxiety , depression Immunizations Pneumococcal Vaccine: Received Pneumococcal Vaccination: Yes Received Pneumococcal Vaccination On or After 60th Birthday: Yes Implantable Devices None Hospitalization / Surgery History Type of Hospitalization/Surgery appendectomy cholecystectomy laproscopic nephrectomy lapro lysis of lesions thorascopy lung biopsy bronchoscopy (L) heart cath with angio X3 endarcdectomy patch angioplasty left great toe amp. December 27, 2020 Family and Social History Cancer: Yes - Maternal Grandparents,Paternal Grandparents,Father,Mother; Diabetes: Yes - Mother,Father; Heart Disease: Yes - Maternal Grandparents,Mother,Father; Hereditary Spherocytosis: No; Hypertension: No; Kidney Disease: Yes - Maternal Grandparents; Lung Disease: Yes - Father; Seizures: No; Stroke: Yes - Paternal Grandparents,Maternal Grandparents,Mother; Thyroid Problems: No; Tuberculosis: No; Current every day smoker - 1/2 pack a day; Alcohol Use: Never; Drug Use: No History; Caffeine Use: Daily - coffee, tea; Financial Concerns: No; Food, Clothing or Shelter Needs: No; Support System Lacking: No; Transportation Concerns: No Electronic Signature(s) Signed: 06/09/2021 2:15:37 PM By: Kalman Shan DO Signed: 06/09/2021 4:56:39 PM By: Lorrin Jackson Entered By: Kalman Shan on 06/09/2021 14:13:37 -------------------------------------------------------------------------------- SuperBill Details Patient Name: Date of Service: [name]. 06/09/2021 Medical Record Number: [id number] Patient Account Number: [id number] Date of Birth/Sex: Treating RN: October 06, 1955 (65 y.o. [name] Primary Care Provider: Carolee Rota Other Clinician: Referring Provider: Treating Provider/Extender: Maryagnes Amos in Treatment: 13 Diagnosis Coding ICD-10 Codes Code Description [id number] Non-pressure chronic ulcer of other part of left foot with other specified severity T81.31XA Disruption of external operation (surgical) wound, not elsewhere classified, initial encounter E11.621 Type 2 diabetes mellitus with foot ulcer J44.9 Chronic obstructive pulmonary disease, unspecified Facility Procedures CPT4 Code: [id number] Description: 39767 - DEB SUBQ TISSUE 20 SQ CM/< ICD-10 Diagnosis Description L97.528 Non-pressure chronic ulcer of other part of left foot with other specified seve E11.621 Type 2 diabetes mellitus with foot ulcer Modifier: rity Quantity: 1 Physician Procedures : CPT4 Code Description Modifier [id number] - WC PHYS SUBQ TISS 20 SQ CM ICD-10 Diagnosis Description L97.528 Non-pressure chronic ulcer of other part of left foot with other specified severity E11.621 Type 2 diabetes mellitus with foot ulcer Quantity: 1 Electronic Signature(s) Signed: 06/09/2021 2:15:37 PM By: Kalman Shan DO Entered By: Kalman Shan on 06/09/2021 14:15:22

## 2021-06-09 NOTE — Progress Notes (Signed)
[name] ([id number]) , Visit Report for 06/09/2021 Arrival Information Details Patient Name: Date of Service: [name], [name] 06/09/2021 12:45 PM Medical Record Number: [id number] Patient Account Number: [id number] Date of Birth/Sex: Treating RN: Jan 10, 1956 (65 y.o. [name] Primary Care Proctor Carriker: Carolee Rota Other Clinician: Referring Nastasia Kage: Treating Azarria Balint/Extender: Maryagnes Amos in Treatment: 50 Visit Information History Since Last Visit Added or deleted any medications: No Patient Arrived: [name] Any new allergies or adverse reactions: No Arrival Time: 13:36 Had a fall or experienced change in No Accompanied By: self activities of daily living that may affect Transfer Assistance: None risk of falls: Patient Requires Transmission-Based Precautions: No Signs or symptoms of abuse/neglect since last visito No Patient Has Alerts: Yes Hospitalized since last visit: No Patient Alerts: ABI's: R: 0.82 L:0.76 Implantable device outside of the clinic excluding No cellular tissue based products placed in the center since last visit: Has Dressing in Place as Prescribed: Yes Pain Present Now: No Electronic Signature(s) Signed: 06/09/2021 3:01:27 PM By: Sandre Kitty Entered By: Sandre Kitty on 06/09/2021 13:37:04 -------------------------------------------------------------------------------- Encounter Discharge Information Details Patient Name: Date of Service: [name]. 06/09/2021 12:45 PM Medical Record Number: [id number] Patient Account Number: [id number] Date of Birth/Sex: Treating RN: [birth year] (65 y.o. [name] Primary Care Ivoree Felmlee: Carolee Rota Other Clinician: Referring Elgie Landino: Treating Tsutomu Barfoot/Extender: Maryagnes Amos in Treatment: 13 Encounter Discharge Information Items Post Procedure Vitals Discharge Condition: Stable Temperature (F): 97.6 Ambulatory Status:  Cane Pulse (bpm): 105 Discharge Destination: Home Respiratory Rate (breaths/min): 18 Transportation: Private Auto Blood Pressure (mmHg): 103/67 Schedule Follow-up Appointment: Yes Clinical Summary of Care: Provided on 06/09/2021 Form Type Recipient Paper Patient Patient Electronic Signature(s) Signed: 06/09/2021 4:56:39 PM By: Lorrin Jackson Entered By: Lorrin Jackson on 06/09/2021 14:02:12 -------------------------------------------------------------------------------- Lower Extremity Assessment Details Patient Name: Date of Service: Kevin, [name] 06/09/2021 12:45 PM Medical Record Number: [id number] Patient Account Number: [id number] Date of Birth/Sex: Treating RN: 04-Mar-1956 (65 y.o. [name] Primary Care Arilyn Brierley: Carolee Rota Other Clinician: Referring Najia Hurlbutt: Treating Ali Mclaurin/Extender: Maryagnes Amos in Treatment: 13 Edema Assessment Assessed: [name]: Yes] [name]: No] Edema: [Left: Ye] [Right: s] Calf Left: Right: Point of Measurement: 37 cm From Medial Instep 36.8 cm Ankle Left: Right: Point of Measurement: 8 cm From Medial Instep 23 cm Vascular Assessment Pulses: Dorsalis Pedis Palpable: [Left:Yes] Electronic Signature(s) Signed: 06/09/2021 4:56:39 PM By: Lorrin Jackson Entered By: Lorrin Jackson on 06/09/2021 13:49:30 -------------------------------------------------------------------------------- Multi Wound Chart Details Patient Name: Date of Service: [name]. 06/09/2021 12:45 PM Medical Record Number: [id number] Patient Account Number: [id number] Date of Birth/Sex: Treating RN: [birth year] (65 y.o. [name] Primary Care Reveca Desmarais: Carolee Rota Other Clinician: Referring Joselyn Edling: Treating Anna Livers/Extender: Maryagnes Amos in Treatment: 13 Vital Signs Height(in): Capillary Blood Glucose(mg/dl): 350 Weight(lbs): Pulse(bpm): 105 Body Mass Index(BMI): Blood  Pressure(mmHg): 103/67 Temperature(F): 97.6 Respiratory Rate(breaths/min): 18 Photos: [7:Left Amputation Site - Transmetatarsal N/A] [N/A:N/A] Wound Location: [7:Surgical Injury] [N/A:N/A] Wounding Event: [7:Dehisced Wound] [N/A:N/A] Primary Etiology: [7:Chronic Obstructive Pulmonary] [N/A:N/A] Comorbid History: [7:Disease (COPD), Sleep Apnea, Coronary Artery Disease, Hypertension, Type II Diabetes 05/20/2021] [N/A:N/A] Date Acquired: [7:1] [N/A:N/A] Weeks of Treatment: [7:Open] [N/A:N/A] Wound Status: [7:1x5.2x0.9] [N/A:N/A] Measurements L x W x D (cm) [7:4.084] [N/A:N/A] A (cm) : rea [7:3.676] [N/A:N/A] Volume (cm) : [7:39.50%] [N/A:N/A] % Reduction in Area: [7:-36.00%] [N/A:N/A] % Reduction in Volume: [7:Full Thickness Without Exposed] [N/A:N/A] Classification: [7:Support Structures Medium] [N/A:N/A] Exudate Amount: [7:Serosanguineous] [N/A:N/A] Exudate Type: [7:red, brown] [N/A:N/A] Exudate Color: [  7:Yes] [N/A:N/A] Foul Odor A Cleansing: [7:fter No] [N/A:N/A] Odor Anticipated Due to Product Use: [7:Well defined, not attached] [N/A:N/A] Wound Margin: [7:Medium (34-66%)] [N/A:N/A] Granulation A mount: [7:Red] [N/A:N/A] Granulation Quality: [7:Medium (34-66%)] [N/A:N/A] Necrotic Amount: [7:Fat Layer (Subcutaneous Tissue): Yes N/A] Exposed Structures: [7:Fascia: No Tendon: No Muscle: No Joint: No Bone: No Small (1-33%)] [N/A:N/A] Epithelialization: [7:Debridement - Excisional] [N/A:N/A] Debridement: Pre-procedure Verification/Time Out 13:49 [N/A:N/A] Taken: [7:Subcutaneous, Slough] [N/A:N/A] Tissue Debrided: [7:Skin/Subcutaneous Tissue] [N/A:N/A] Level: [7:5.2] [N/A:N/A] Debridement A (sq cm): [7:rea Curette] [N/A:N/A] Instrument: [7:Minimum] [N/A:N/A] Bleeding: [7:Pressure] [N/A:N/A] Hemostasis A chieved: [7:Procedure was tolerated well] [N/A:N/A] Debridement Treatment Response: [7:1x5.2x0.9] [N/A:N/A] Post Debridement Measurements L x W x D (cm) [7:3.676]  [N/A:N/A] Post Debridement Volume: (cm) [7:Debridement] [N/A:N/A] Treatment Notes Wound #7 (Amputation Site - Transmetatarsal) Wound Laterality: Left Cleanser Peri-Wound Care Topical Primary Dressing Secondary Dressing Secured With Compression Wrap Compression Stockings Add-Ons Electronic Signature(s) Signed: 06/09/2021 2:15:37 PM By: Kalman Shan DO Signed: 06/09/2021 4:56:39 PM By: Lorrin Jackson Entered By: Kalman Shan on 06/09/2021 14:12:50 -------------------------------------------------------------------------------- Multi-Disciplinary Care Plan Details Patient Name: Date of Service: [name]. 06/09/2021 12:45 PM Medical Record Number: [id number] Patient Account Number: [id number] Date of Birth/Sex: Treating RN: 1955-11-08 (65 y.o. [name] Primary Care Shakirra Buehler: Carolee Rota Other Clinician: Referring Glenroy Crossen: Treating Bryler Dibble/Extender: Maryagnes Amos in Treatment: [place] reviewed with physician Active Inactive Wound/Skin Impairment Nursing Diagnoses: Impaired tissue integrity Knowledge deficit related to ulceration/compromised skin integrity Goals: Patient will have a decrease in wound volume by X% from date: (specify in notes) Date Initiated: 03/06/2021 Date Inactivated: 04/15/2021 Target Resolution Date: 03/26/2021 Goal Status: Met Patient/caregiver will verbalize understanding of skin care regimen Date Initiated: 03/06/2021 Target Resolution Date: 06/30/2021 Goal Status: Active Ulcer/skin breakdown will have a volume reduction of 30% by week 4 Date Initiated: 03/06/2021 Date Inactivated: 04/15/2021 Target Resolution Date: 03/26/2021 Goal Status: Unmet Unmet Reason: probable osteomyelitis Ulcer/skin breakdown will have a volume reduction of 50% by week 8 Date Initiated: 04/15/2021 Date Inactivated: 04/21/2021 Target Resolution Date: 04/22/2021 Goal Status: Met Ulcer/skin breakdown  will have a volume reduction of 80% by week 12 Date Initiated: 04/21/2021 Target Resolution Date: 06/30/2021 Goal Status: Active Interventions: Assess patient/caregiver ability to obtain necessary supplies Assess patient/caregiver ability to perform ulcer/skin care regimen upon admission and as needed Assess ulceration(s) every visit Notes: 06/02/21: Patient had transmet 10/27. Incision dehisced, wound care ongoing. Electronic Signature(s) Signed: 06/09/2021 4:56:39 PM By: Lorrin Jackson Entered By: Lorrin Jackson on 06/09/2021 13:52:13 -------------------------------------------------------------------------------- Pain Assessment Details Patient Name: Date of Service: Kevin, [name] 06/09/2021 12:45 PM Medical Record Number: [id number] Patient Account Number: [id number] Date of Birth/Sex: Treating RN: [birth year] (65 y.o. [name] Primary Care Willford Rabideau: Carolee Rota Other Clinician: Referring Amanie Mcculley: Treating Aimar Shrewsbury/Extender: Maryagnes Amos in Treatment: 13 Active Problems Location of Pain Severity and Description of Pain Patient Has Paino No Site Locations Pain Management and Medication Current Pain Management: Electronic Signature(s) Signed: 06/09/2021 3:01:27 PM By: Sandre Kitty Signed: 06/09/2021 4:56:39 PM By: Lorrin Jackson Entered By: Sandre Kitty on 06/09/2021 13:37:48 -------------------------------------------------------------------------------- Patient/Caregiver Education Details Patient Name: Date of Service: [name] 11/21/2022andnbsp12:45 PM Medical Record Number: [id number] Patient Account Number: [id number] Date of Birth/Gender: Treating RN: 07/19/1956 (65 y.o. [name] Primary Care Physician: Carolee Rota Other Clinician: Referring Physician: Treating Physician/Extender: Maryagnes Amos in Treatment: 13 Education Assessment Education Provided  To: Patient Education Topics Provided Offloading: Methods: Explain/Verbal, Printed Responses: State content correctly Wound/Skin Impairment: Methods: Explain/Verbal,  Printed Responses: State content correctly Electronic Signature(s) Signed: 06/09/2021 4:56:39 PM By: Lorrin Jackson Entered By: Lorrin Jackson on 06/09/2021 13:52:53 -------------------------------------------------------------------------------- Wound Assessment Details Patient Name: Date of Service: Kevin, [name] 06/09/2021 12:45 PM Medical Record Number: [id number] Patient Account Number: [id number] Date of Birth/Sex: Treating RN: 30-Sep-1955 (64 y.o. [name] Primary Care Aziyah Provencal: Carolee Rota Other Clinician: Referring Jimmey Hengel: Treating Yulianna Folse/Extender: Maryagnes Amos in Treatment: 13 Wound Status Wound Number: 7 Primary Dehisced Wound Etiology: Wound Location: Left Amputation Site - Transmetatarsal Wound Open Wounding Event: Surgical Injury Status: Date Acquired: 05/20/2021 Comorbid Chronic Obstructive Pulmonary Disease (COPD), Sleep Apnea, Weeks Of Treatment: 1 History: Coronary Artery Disease, Hypertension, Type II Diabetes Clustered Wound: No Photos Wound Measurements Length: (cm) 1 Width: (cm) 5.2 Depth: (cm) 0.9 Area: (cm) 4.084 Volume: (cm) 3.676 % Reduction in Area: 39.5% % Reduction in Volume: -36% Epithelialization: Small (1-33%) Tunneling: No Undermining: No Wound Description Classification: Full Thickness Without Exposed Support Structures Wound Margin: Well defined, not attached Exudate Amount: Medium Exudate Type: Serosanguineous Exudate Color: red, brown Foul Odor After Cleansing: Yes Due to Product Use: No Slough/Fibrino Yes Wound Bed Granulation Amount: Medium (34-66%) Exposed Structure Granulation Quality: Red Fascia Exposed: No Necrotic Amount: Medium (34-66%) Fat Layer (Subcutaneous Tissue) Exposed: Yes Necrotic Quality:  Adherent Slough Tendon Exposed: No Muscle Exposed: No Joint Exposed: No Bone Exposed: No Treatment Notes Wound #7 (Amputation Site - Transmetatarsal) Wound Laterality: Left Cleanser Peri-Wound Care Topical Primary Dressing Secondary Dressing Secured With Compression Wrap Compression Stockings Add-Ons Electronic Signature(s) Signed: 06/09/2021 4:56:39 PM By: Lorrin Jackson Entered By: Lorrin Jackson on 06/09/2021 13:51:28 -------------------------------------------------------------------------------- Vitals Details Patient Name: Date of Service: [name]. 06/09/2021 12:45 PM Medical Record Number: [id number] Patient Account Number: [id number] Date of Birth/Sex: Treating RN: March 03, 1956 (65 y.o. [name] Primary Care Jonel Weldon: Carolee Rota Other Clinician: Referring Jamine Wingate: Treating Fountain Derusha/Extender: Maryagnes Amos in Treatment: 13 Vital Signs Time Taken: 13:37 Temperature (F): 97.6 Pulse (bpm): 105 Respiratory Rate (breaths/min): 18 Blood Pressure (mmHg): 103/67 Capillary Blood Glucose (mg/dl): 350 Reference Range: 80 - 120 mg / dl Electronic Signature(s) Signed: 06/09/2021 3:01:27 PM By: Sandre Kitty Entered By: Sandre Kitty on 06/09/2021 13:37:39

## 2021-06-11 DIAGNOSIS — I10 Essential (primary) hypertension: Secondary | ICD-10-CM | POA: Diagnosis not present

## 2021-06-11 DIAGNOSIS — Z4801 Encounter for change or removal of surgical wound dressing: Secondary | ICD-10-CM | POA: Diagnosis not present

## 2021-06-11 DIAGNOSIS — M1990 Unspecified osteoarthritis, unspecified site: Secondary | ICD-10-CM | POA: Diagnosis not present

## 2021-06-11 DIAGNOSIS — F32A Depression, unspecified: Secondary | ICD-10-CM | POA: Diagnosis not present

## 2021-06-11 DIAGNOSIS — E1169 Type 2 diabetes mellitus with other specified complication: Secondary | ICD-10-CM | POA: Diagnosis not present

## 2021-06-11 DIAGNOSIS — Z79891 Long term (current) use of opiate analgesic: Secondary | ICD-10-CM | POA: Diagnosis not present

## 2021-06-11 DIAGNOSIS — M86172 Other acute osteomyelitis, left ankle and foot: Secondary | ICD-10-CM | POA: Diagnosis not present

## 2021-06-11 DIAGNOSIS — G4730 Sleep apnea, unspecified: Secondary | ICD-10-CM | POA: Diagnosis not present

## 2021-06-11 DIAGNOSIS — Z9181 History of falling: Secondary | ICD-10-CM | POA: Diagnosis not present

## 2021-06-11 DIAGNOSIS — I2510 Atherosclerotic heart disease of native coronary artery without angina pectoris: Secondary | ICD-10-CM | POA: Diagnosis not present

## 2021-06-11 DIAGNOSIS — E1142 Type 2 diabetes mellitus with diabetic polyneuropathy: Secondary | ICD-10-CM | POA: Diagnosis not present

## 2021-06-11 DIAGNOSIS — J449 Chronic obstructive pulmonary disease, unspecified: Secondary | ICD-10-CM | POA: Diagnosis not present

## 2021-06-11 DIAGNOSIS — Z905 Acquired absence of kidney: Secondary | ICD-10-CM | POA: Diagnosis not present

## 2021-06-11 DIAGNOSIS — Z7902 Long term (current) use of antithrombotics/antiplatelets: Secondary | ICD-10-CM | POA: Diagnosis not present

## 2021-06-11 DIAGNOSIS — F17210 Nicotine dependence, cigarettes, uncomplicated: Secondary | ICD-10-CM | POA: Diagnosis not present

## 2021-06-11 DIAGNOSIS — C641 Malignant neoplasm of right kidney, except renal pelvis: Secondary | ICD-10-CM | POA: Diagnosis not present

## 2021-06-11 DIAGNOSIS — Z91199 Patient's noncompliance with other medical treatment and regimen due to unspecified reason: Secondary | ICD-10-CM | POA: Diagnosis not present

## 2021-06-11 DIAGNOSIS — Z7984 Long term (current) use of oral hypoglycemic drugs: Secondary | ICD-10-CM | POA: Diagnosis not present

## 2021-06-11 DIAGNOSIS — Z4781 Encounter for orthopedic aftercare following surgical amputation: Secondary | ICD-10-CM | POA: Diagnosis not present

## 2021-06-11 DIAGNOSIS — Z89432 Acquired absence of left foot: Secondary | ICD-10-CM | POA: Diagnosis not present

## 2021-06-11 DIAGNOSIS — K219 Gastro-esophageal reflux disease without esophagitis: Secondary | ICD-10-CM | POA: Diagnosis not present

## 2021-06-13 DIAGNOSIS — Z4781 Encounter for orthopedic aftercare following surgical amputation: Secondary | ICD-10-CM | POA: Diagnosis not present

## 2021-06-13 DIAGNOSIS — Z9181 History of falling: Secondary | ICD-10-CM | POA: Diagnosis not present

## 2021-06-13 DIAGNOSIS — E1142 Type 2 diabetes mellitus with diabetic polyneuropathy: Secondary | ICD-10-CM | POA: Diagnosis not present

## 2021-06-13 DIAGNOSIS — G4730 Sleep apnea, unspecified: Secondary | ICD-10-CM | POA: Diagnosis not present

## 2021-06-13 DIAGNOSIS — Z7902 Long term (current) use of antithrombotics/antiplatelets: Secondary | ICD-10-CM | POA: Diagnosis not present

## 2021-06-13 DIAGNOSIS — Z91199 Patient's noncompliance with other medical treatment and regimen due to unspecified reason: Secondary | ICD-10-CM | POA: Diagnosis not present

## 2021-06-13 DIAGNOSIS — M86172 Other acute osteomyelitis, left ankle and foot: Secondary | ICD-10-CM | POA: Diagnosis not present

## 2021-06-13 DIAGNOSIS — E1169 Type 2 diabetes mellitus with other specified complication: Secondary | ICD-10-CM | POA: Diagnosis not present

## 2021-06-13 DIAGNOSIS — F32A Depression, unspecified: Secondary | ICD-10-CM | POA: Diagnosis not present

## 2021-06-13 DIAGNOSIS — J449 Chronic obstructive pulmonary disease, unspecified: Secondary | ICD-10-CM | POA: Diagnosis not present

## 2021-06-13 DIAGNOSIS — Z7984 Long term (current) use of oral hypoglycemic drugs: Secondary | ICD-10-CM | POA: Diagnosis not present

## 2021-06-13 DIAGNOSIS — K219 Gastro-esophageal reflux disease without esophagitis: Secondary | ICD-10-CM | POA: Diagnosis not present

## 2021-06-13 DIAGNOSIS — I10 Essential (primary) hypertension: Secondary | ICD-10-CM | POA: Diagnosis not present

## 2021-06-13 DIAGNOSIS — I2510 Atherosclerotic heart disease of native coronary artery without angina pectoris: Secondary | ICD-10-CM | POA: Diagnosis not present

## 2021-06-13 DIAGNOSIS — Z4801 Encounter for change or removal of surgical wound dressing: Secondary | ICD-10-CM | POA: Diagnosis not present

## 2021-06-13 DIAGNOSIS — C641 Malignant neoplasm of right kidney, except renal pelvis: Secondary | ICD-10-CM | POA: Diagnosis not present

## 2021-06-13 DIAGNOSIS — M1990 Unspecified osteoarthritis, unspecified site: Secondary | ICD-10-CM | POA: Diagnosis not present

## 2021-06-13 DIAGNOSIS — Z89432 Acquired absence of left foot: Secondary | ICD-10-CM | POA: Diagnosis not present

## 2021-06-13 DIAGNOSIS — F17210 Nicotine dependence, cigarettes, uncomplicated: Secondary | ICD-10-CM | POA: Diagnosis not present

## 2021-06-13 DIAGNOSIS — Z79891 Long term (current) use of opiate analgesic: Secondary | ICD-10-CM | POA: Diagnosis not present

## 2021-06-13 DIAGNOSIS — Z905 Acquired absence of kidney: Secondary | ICD-10-CM | POA: Diagnosis not present

## 2021-06-16 ENCOUNTER — Encounter (HOSPITAL_BASED_OUTPATIENT_CLINIC_OR_DEPARTMENT_OTHER): Payer: Medicare Other | Admitting: Internal Medicine

## 2021-06-16 ENCOUNTER — Other Ambulatory Visit: Payer: Self-pay

## 2021-06-16 DIAGNOSIS — E11621 Type 2 diabetes mellitus with foot ulcer: Secondary | ICD-10-CM | POA: Diagnosis not present

## 2021-06-16 DIAGNOSIS — Z794 Long term (current) use of insulin: Secondary | ICD-10-CM | POA: Diagnosis not present

## 2021-06-16 DIAGNOSIS — J449 Chronic obstructive pulmonary disease, unspecified: Secondary | ICD-10-CM

## 2021-06-16 DIAGNOSIS — T8131XD Disruption of external operation (surgical) wound, not elsewhere classified, subsequent encounter: Secondary | ICD-10-CM

## 2021-06-16 DIAGNOSIS — Z89412 Acquired absence of left great toe: Secondary | ICD-10-CM | POA: Diagnosis not present

## 2021-06-16 DIAGNOSIS — L97528 Non-pressure chronic ulcer of other part of left foot with other specified severity: Secondary | ICD-10-CM | POA: Diagnosis not present

## 2021-06-16 DIAGNOSIS — Z9981 Dependence on supplemental oxygen: Secondary | ICD-10-CM | POA: Diagnosis not present

## 2021-06-16 DIAGNOSIS — F17210 Nicotine dependence, cigarettes, uncomplicated: Secondary | ICD-10-CM | POA: Diagnosis not present

## 2021-06-16 DIAGNOSIS — T8131XA Disruption of external operation (surgical) wound, not elsewhere classified, initial encounter: Secondary | ICD-10-CM | POA: Diagnosis not present

## 2021-06-16 DIAGNOSIS — E1165 Type 2 diabetes mellitus with hyperglycemia: Secondary | ICD-10-CM | POA: Diagnosis not present

## 2021-06-17 NOTE — Progress Notes (Signed)
[name] ([id number]) , Visit Report for 06/16/2021 Chief Complaint Document Details Patient Name: Date of Service: [name], [name] 06/16/2021 12:30 PM Medical Record Number: [id number] Patient Account Number: [id number] Date of Birth/Sex: Treating RN: Jan 15, 1956 (65 y.o. [name] Primary Care Provider: Carolee Rota Other Clinician: Referring Provider: Treating Provider/Extender: Maryagnes Amos in Treatment: 14 Information Obtained from: Patient Chief Complaint Nonhealing wound secondary to Wound dehiscence status post transmetatarsal amputation on the left Electronic Signature(s) Signed: 06/16/2021 1:24:03 PM By: Kalman Shan DO Entered By: Kalman Shan on 06/16/2021 13:18:02 -------------------------------------------------------------------------------- HPI Details Patient Name: Date of Service: [name]. 06/16/2021 12:30 PM Medical Record Number: [id number] Patient Account Number: [id number] Date of Birth/Sex: Treating RN: [birth year] (65 y.o. [name] Primary Care Provider: Carolee Rota Other Clinician: Referring Provider: Treating Provider/Extender: Maryagnes Amos in Treatment: 14 History of Present Illness HPI Description: Admission 8/18 [name] is a 65 year old male with a past medical history of uncontrolled insulin-dependent type 2 diabetes, COPD On O2 6 L via nasal cannula That presents to the clinic for a 16-month history of nonhealing wound to the left great toe amputation site. He has been following with podiatry for a wound to his left great toe that developed osteomyelitis and subsequently required amputation. He had the surgery done on 6/10. He had a partial first ray amputation by Dr. Posey Pronto. Since his surgery he has been placing iodine on the wound bed. He states that there has been no improvement to the wound healing in the past 2 months. He states he has occasional  intermittent pain to the area. He denies systemic signs of infection. 8/25; patient presents for 1 week follow-up. He states he has been using silver alginate daily without issues. He has no complaints today. He denies signs of infection. He is scheduled to have his MRI on 8/27. 9/15; patient presents for follow-up. He missed his last clinic appointment due to feeling ill. He continues to use silver alginate daily without any issues. He reports improvement in wound healing. He currently denies signs of infection. 9/27; patient presents for follow-up. He has developed a new wound to the second left toe. He is unaware of this and was noted on exam today. He continues to use silver alginate to the amputation site wound. He has no issues or complaints today. He currently denies signs of infection. 10/4; patient presents for follow-up. He is using silver alginate to the wound sites. He has no issues or complaints today. He denies systemic signs of infection. 10/10; patient presents for follow-up. He has been using silver alginate to the wound sites. He states that his foot became more swollen over the weekend, But has since improved. He overall feels okay. Admission 06/02/2021 Patient presents for nonhealing wound secondary to wound dehiscence following a transmetatarsal amputation. He states he has been using Betadine wet-to- dry dressings to the wound bed. He states the wound has been open for the past 3 weeks. He currently denies Pain or signs of infection. 11/21; patient presents for follow-up. He has home health but states this will end in 2 weeks per insurance. He has no issues or complaints today. He denies signs of infection. 11/28; patient presents for follow-up. He states that home health will be stopped at the end of the week. He has no issues or complaints today. Electronic Signature(s) Signed: 06/16/2021 1:24:03 PM By: Kalman Shan DO Entered By: Kalman Shan on 06/16/2021  13:19:06 -------------------------------------------------------------------------------- Physical Exam  Details Patient Name: Date of Service: [name], [name] 06/16/2021 12:30 PM Medical Record Number: [id number] Patient Account Number: [id number] Date of Birth/Sex: Treating RN: [birth year] (65 y.o. [name] Primary Care Provider: Carolee Rota Other Clinician: Referring Provider: Treating Provider/Extender: Maryagnes Amos in Treatment: 14 Constitutional respirations regular, non-labored and within target range for patient.. Cardiovascular 2+ dorsalis pedis/posterior tibialis pulses. Psychiatric pleasant and cooperative. Notes Left foot: Transmetatarsal amputation. Wound dehiscence at the surgical site. Nonviable tissue in the wound bed. Tunneling at the 9 o'clock position that probes to bone. No purulent drainage. No signs of infection. Electronic Signature(s) Signed: 06/16/2021 1:24:03 PM By: Kalman Shan DO Entered By: Kalman Shan on 06/16/2021 13:20:17 -------------------------------------------------------------------------------- Physician Orders Details Patient Name: Date of Service: [name]. 06/16/2021 12:30 PM Medical Record Number: [id number] Patient Account Number: [id number] Date of Birth/Sex: Treating RN: 08-30-1955 (65 y.o. Janyth Contes Primary Care Provider: Carolee Rota Other Clinician: Referring Provider: Treating Provider/Extender: Maryagnes Amos in Treatment: 14 Verbal / Phone Orders: No Diagnosis Coding ICD-10 Coding Code Description [phone] Non-pressure chronic ulcer of other part of left foot with other specified severity T81.31XA Disruption of external operation (surgical) wound, not elsewhere classified, initial encounter E11.621 Type 2 diabetes mellitus with foot ulcer J44.9 Chronic obstructive pulmonary disease, unspecified Follow-up Appointments ppointment in 1 week.  - Dr. Heber [place] Return A Bathing/ Shower/ Hygiene May shower and wash wound with soap and water. - with dressing changes only. Edema Control - Lymphedema / SCD / Other Elevate legs to the level of the heart or above for 30 minutes daily and/or when sitting, a frequency of: - throughout the day Avoid standing for long periods of time. Moisturize legs daily. - both legs and feet every night before bed. Off-Loading DH Walker Boot to: - left foot per surgeon Additional Orders / Instructions Follow Nutritious Diet - Monitor and keep blood sugars well controlled Home Health New wound care orders this week; continue Home Health for wound care. May utilize formulary equivalent dressing for wound treatment orders unless otherwise specified. - Change primary dressing to Hydrofera Blue, lightly pack tunnel at 9 o'clock with Iodoform packing strip Other Home Health Orders/Instructions: - [place] Wound Treatment Wound #7 - Amputation Site - Transmetatarsal Wound Laterality: Left Cleanser: Soap and Water [facility]) Every Other Day/30 Days Discharge Instructions: May shower and wash wound with dial antibacterial soap and water prior to dressing change. Cleanser: Wound Cleanser [facility]) Every Other Day/30 Days Discharge Instructions: Cleanse the wound with wound cleanser prior to applying a clean dressing using gauze sponges, not tissue or cotton balls. Prim Dressing: Hydrofera Blue Classic Foam, 4x4 in [facility]) (Generic) Every Other Day/30 Days ary Discharge Instructions: Moisten with saline prior to applying to wound bed Prim Dressing: Iodoform packing strip 1/4 (in) [facility]) Every Other Day/30 Days ary Discharge Instructions: ****Lightly pack into tunnel at 9 o'clock***** Secondary Dressing: Woven Gauze Sponge, Non-Sterile 4x4 in [facility]) Every Other Day/30 Days Discharge Instructions: Apply over primary dressing as directed. Secondary Dressing: ABD Pad, 5x9 [facility]) Every Other Day/30 Days Discharge Instructions: Apply over primary dressing as directed. Secured With: Elastic Bandage 4 inch (ACE bandage) (Home Health) Every Other Day/30 Days Discharge Instructions: Secure with ACE bandage as directed. Secured With: [employer], 4.5x3.1 (in/yd) [facility]) Every Other Day/30 Days Discharge Instructions: Secure with Kerlix as directed. Electronic Signature(s) Signed: 06/16/2021 1:24:03 PM By: Kalman Shan DO Entered By: Kalman Shan on  06/16/2021 13:20:32 -------------------------------------------------------------------------------- Problem List Details Patient Name: Date of Service: BRALIN, [name] 06/16/2021 12:30 PM Medical Record Number: [id number] Patient Account Number: [id number] Date of Birth/Sex: Treating RN: 1956-03-23 (65 y.o. Janyth Contes Primary Care Provider: Carolee Rota Other Clinician: Referring Provider: Treating Provider/Extender: Maryagnes Amos in Treatment: 14 Active Problems ICD-10 Encounter Code Description Active Date MDM Diagnosis L97.528 Non-pressure chronic ulcer of other part of left foot with other specified 06/02/2021 No Yes severity T81.31XD Disruption of external operation (surgical) wound, not elsewhere classified, 06/02/2021 No Yes subsequent encounter E11.621 Type 2 diabetes mellitus with foot ulcer 03/06/2021 No Yes J44.9 Chronic obstructive pulmonary disease, unspecified 03/06/2021 No Yes Inactive Problems ICD-10 Code Description Active Date Inactive Date S91.302A Unspecified open wound, left foot, initial encounter 03/06/2021 03/06/2021 Resolved Problems Electronic Signature(s) Signed: 06/16/2021 1:24:03 PM By: Kalman Shan DO Entered By: Kalman Shan on 06/16/2021 13:17:39 -------------------------------------------------------------------------------- Progress Note Details Patient Name: Date of Service: [name]. 06/16/2021  12:30 PM Medical Record Number: [id number] Patient Account Number: [id number] Date of Birth/Sex: Treating RN: 1955/10/06 (65 y.o. [name] Primary Care Provider: Carolee Rota Other Clinician: Referring Provider: Treating Provider/Extender: Maryagnes Amos in Treatment: 14 Subjective Chief Complaint Information obtained from Patient Nonhealing wound secondary to Wound dehiscence status post transmetatarsal amputation on the left History of Present Illness (HPI) Admission 8/18 [name] is a 65 year old male with a past medical history of uncontrolled insulin-dependent type 2 diabetes, COPD On O2 6 L via nasal cannula That presents to the clinic for a 30-month history of nonhealing wound to the left great toe amputation site. He has been following with podiatry for a wound to his left great toe that developed osteomyelitis and subsequently required amputation. He had the surgery done on 6/10. He had a partial first ray amputation by Dr. Posey Pronto. Since his surgery he has been placing iodine on the wound bed. He states that there has been no improvement to the wound healing in the past 2 months. He states he has occasional intermittent pain to the area. He denies systemic signs of infection. 8/25; patient presents for 1 week follow-up. He states he has been using silver alginate daily without issues. He has no complaints today. He denies signs of infection. He is scheduled to have his MRI on 8/27. 9/15; patient presents for follow-up. He missed his last clinic appointment due to feeling ill. He continues to use silver alginate daily without any issues. He reports improvement in wound healing. He currently denies signs of infection. 9/27; patient presents for follow-up. He has developed a new wound to the second left toe. He is unaware of this and was noted on exam today. He continues to use silver alginate to the amputation site wound. He has no issues or  complaints today. He currently denies signs of infection. 10/4; patient presents for follow-up. He is using silver alginate to the wound sites. He has no issues or complaints today. He denies systemic signs of infection. 10/10; patient presents for follow-up. He has been using silver alginate to the wound sites. He states that his foot became more swollen over the weekend, But has since improved. He overall feels okay. Admission 06/02/2021 Patient presents for nonhealing wound secondary to wound dehiscence following a transmetatarsal amputation. He states he has been using Betadine wet-to- dry dressings to the wound bed. He states the wound has been open for the past 3 weeks. He currently denies Pain or signs of infection.  11/21; patient presents for follow-up. He has home health but states this will end in 2 weeks per insurance. He has no issues or complaints today. He denies signs of infection. 11/28; patient presents for follow-up. He states that home health will be stopped at the end of the week. He has no issues or complaints today. Patient History Information obtained from Patient. Family History Cancer - Maternal Grandparents,Paternal Grandparents,Father,Mother, Diabetes - Mother,Father, Heart Disease - Maternal Grandparents,Mother,Father, Kidney Disease - Maternal Grandparents, Lung Disease - Father, Stroke - Paternal Grandparents,Maternal Grandparents,Mother, No family history of Hereditary Spherocytosis, Hypertension, Seizures, Thyroid Problems, Tuberculosis. Social History Current every day smoker - 1/2 pack a day, Alcohol Use - Never, Drug Use - No History, Caffeine Use - Daily - coffee, tea. Medical History Respiratory Patient has history of Chronic Obstructive Pulmonary Disease (COPD), Sleep Apnea - does not use CPAP Cardiovascular Patient has history of Coronary Artery Disease - mx vessel, Hypertension Gastrointestinal Denies history of Cirrhosis , Colitis, Crohnoos,  Hepatitis A, Hepatitis B, Hepatitis C Endocrine Patient has history of Type II Diabetes - last A1c - 13.3 Hospitalization/Surgery History - appendectomy. - cholecystectomy. - laproscopic nephrectomy. - lapro lysis of lesions. - thorascopy. - lung biopsy. - bronchoscopy. - (L) heart cath with angio X3. - endarcdectomy. - patch angioplasty. - left great toe amp. December 27, 2020. Medical A Surgical History Notes nd Constitutional Symptoms (General Health) noncompliance , cocaine abuse ,hepatitis ,headaches Respiratory h/o pneumonia , end stage chronic lung disease , COPD/ILD Cardiovascular h/o CVA , hyperlipidemia , coronary atherosclerosis , acute on chronic diastolic heart failure LVEF 60%-65% Gastrointestinal cholecystitis , GERD Genitourinary h/o nephrolithiasis , h/o renal cell carcinoma , CKD Stage 3 Musculoskeletal arthritis Psychiatric anxiety , depression Objective Constitutional respirations regular, non-labored and within target range for patient.. Vitals Time Taken: 12:39 PM, Temperature: 97.4 F, Pulse: 90 bpm, Respiratory Rate: 18 breaths/min, Blood Pressure: 152/77 mmHg, Capillary Blood Glucose: 450 mg/dl. General Notes: glucose per pt report Cardiovascular 2+ dorsalis pedis/posterior tibialis pulses. Psychiatric pleasant and cooperative. General Notes: Left foot: Transmetatarsal amputation. Wound dehiscence at the surgical site. Nonviable tissue in the wound bed. Tunneling at the 9 o'clock position that probes to bone. No purulent drainage. No signs of infection. Integumentary (Hair, Skin) Wound #7 status is Open. Original cause of wound was Surgical Injury. The date acquired was: 05/20/2021. The wound has been in treatment 2 weeks. The wound is located on the Left Amputation Site - Transmetatarsal. The wound measures 1.2cm length x 5.2cm width x 0.8cm depth; 4.901cm^2 area and 3.921cm^3 volume. There is Fat Layer (Subcutaneous Tissue) exposed. There is no undermining  noted, however, there is tunneling at 9:00 with a maximum distance of 2.6cm. There is a medium amount of serosanguineous drainage noted. The wound margin is well defined and not attached to the wound base. There is small (1-33%) pink granulation within the wound bed. There is a large (67-100%) amount of necrotic tissue within the wound bed including Adherent Slough. Assessment Active Problems ICD-10 Non-pressure chronic ulcer of other part of left foot with other specified severity Disruption of external operation (surgical) wound, not elsewhere classified, subsequent encounter Type 2 diabetes mellitus with foot ulcer Chronic obstructive pulmonary disease, unspecified Patient's wound is stable. No signs of infection on exam. There is an area of tunneling at the 9 o'clock position that probes to bone. We will add iodoform packing to this. I recommended Hydrofera Blue to the wound bed. He follows up with his podiatrist Dr. Posey Pronto on 11/30.  I recommended he discuss physical exam findings with him. Patient is an uncontrolled diabetic with most recent blood glucose levels in the 400s. He is also a current smoker. There is little chance of his current wound to heal due to this. Patient expressed understanding. Follow-up in 1 week. Plan Follow-up Appointments: Return Appointment in 1 week. - Dr. Heber [place] Bathing/ Shower/ Hygiene: May shower and wash wound with soap and water. - with dressing changes only. Edema Control - Lymphedema / SCD / Other: Elevate legs to the level of the heart or above for 30 minutes daily and/or when sitting, a frequency of: - throughout the day Avoid standing for long periods of time. Moisturize legs daily. - both legs and feet every night before bed. Off-Loading: DH Walker Boot to: - left foot per surgeon Additional Orders / Instructions: Follow Nutritious Diet - Monitor and keep blood sugars well controlled Home Health: New wound care orders this week; continue Home  Health for wound care. May utilize formulary equivalent dressing for wound treatment orders unless otherwise specified. - Change primary dressing to Hydrofera Blue, lightly pack tunnel at 9 o'clock with Iodoform packing strip Other Home Health Orders/Instructions: - [employer] #7: - Amputation Site - Transmetatarsal Wound Laterality: Left Cleanser: Soap and Water [facility]) Every Other Day/30 Days Discharge Instructions: May shower and wash wound with dial antibacterial soap and water prior to dressing change. Cleanser: Wound Cleanser [facility]) Every Other Day/30 Days Discharge Instructions: Cleanse the wound with wound cleanser prior to applying a clean dressing using gauze sponges, not tissue or cotton balls. Prim Dressing: Hydrofera Blue Classic Foam, 4x4 in [facility]) (Generic) Every Other Day/30 Days ary Discharge Instructions: Moisten with saline prior to applying to wound bed Prim Dressing: Iodoform packing strip 1/4 (in) [facility]) Every Other Day/30 Days ary Discharge Instructions: ****Lightly pack into tunnel at 9 o'clock***** Secondary Dressing: Woven Gauze Sponge, Non-Sterile 4x4 in [facility]) Every Other Day/30 Days Discharge Instructions: Apply over primary dressing as directed. Secondary Dressing: ABD Pad, 5x9 [facility]) Every Other Day/30 Days Discharge Instructions: Apply over primary dressing as directed. Secured With: Elastic Bandage 4 inch (ACE bandage) (Home Health) Every Other Day/30 Days Discharge Instructions: Secure with ACE bandage as directed. Secured With: [employer], 4.5x3.1 (in/yd) [facility]) Every Other Day/30 Days Discharge Instructions: Secure with Kerlix as directed. 1. Hydrofera Blue and iodoform packing 2. Follow-up in 1 week Electronic Signature(s) Signed: 06/16/2021 1:24:03 PM By: Kalman Shan DO Entered By: Kalman Shan on 06/16/2021  13:23:14 -------------------------------------------------------------------------------- HxROS Details Patient Name: Date of Service: [name]. 06/16/2021 12:30 PM Medical Record Number: [id number] Patient Account Number: [id number] Date of Birth/Sex: Treating RN: 1955-08-16 (65 y.o. [name] Primary Care Provider: Carolee Rota Other Clinician: Referring Provider: Treating Provider/Extender: Maryagnes Amos in Treatment: 14 Information Obtained From Patient Constitutional Symptoms (General Health) Medical History: Past Medical History Notes: noncompliance , cocaine abuse ,hepatitis ,headaches Respiratory Medical History: Positive for: Chronic Obstructive Pulmonary Disease (COPD); Sleep Apnea - does not use CPAP Past Medical History Notes: h/o pneumonia , end stage chronic lung disease , COPD/ILD Cardiovascular Medical History: Positive for: Coronary Artery Disease - mx vessel; Hypertension Past Medical History Notes: h/o CVA , hyperlipidemia , coronary atherosclerosis , acute on chronic diastolic heart failure LVEF 60%-65% Gastrointestinal Medical History: Negative for: Cirrhosis ; Colitis; Crohns; Hepatitis A; Hepatitis B; Hepatitis C Past Medical History Notes: cholecystitis , GERD Endocrine Medical History: Positive for: Type II Diabetes -  last A1c - 13.3 Genitourinary Medical History: Past Medical History Notes: h/o nephrolithiasis , h/o renal cell carcinoma , CKD Stage 3 Musculoskeletal Medical History: Past Medical History Notes: arthritis Psychiatric Medical History: Past Medical History Notes: anxiety , depression Immunizations Pneumococcal Vaccine: Received Pneumococcal Vaccination: Yes Received Pneumococcal Vaccination On or After 60th Birthday: Yes Implantable Devices None Hospitalization / Surgery History Type of Hospitalization/Surgery appendectomy cholecystectomy laproscopic nephrectomy lapro lysis  of lesions thorascopy lung biopsy bronchoscopy (L) heart cath with angio X3 endarcdectomy patch angioplasty left great toe amp. December 27, 2020 Family and Social History Cancer: Yes - Maternal Grandparents,Paternal Grandparents,Father,Mother; Diabetes: Yes - Mother,Father; Heart Disease: Yes - Maternal Grandparents,Mother,Father; Hereditary Spherocytosis: No; Hypertension: No; Kidney Disease: Yes - Maternal Grandparents; Lung Disease: Yes - Father; Seizures: No; Stroke: Yes - Paternal Grandparents,Maternal Grandparents,Mother; Thyroid Problems: No; Tuberculosis: No; Current every day smoker - 1/2 pack a day; Alcohol Use: Never; Drug Use: No History; Caffeine Use: Daily - coffee, tea; Financial Concerns: No; Food, Clothing or Shelter Needs: No; Support System Lacking: No; Transportation Concerns: No Electronic Signature(s) Signed: 06/16/2021 1:24:03 PM By: Kalman Shan DO Signed: 06/16/2021 6:07:33 PM By: Lorrin Jackson Entered By: Kalman Shan on 06/16/2021 13:19:39 -------------------------------------------------------------------------------- SuperBill Details Patient Name: Date of Service: [name]. 06/16/2021 Medical Record Number: [id number] Patient Account Number: [id number] Date of Birth/Sex: Treating RN: 03/24/1956 (65 y.o. [name] Primary Care Provider: Carolee Rota Other Clinician: Referring Provider: Treating Provider/Extender: Maryagnes Amos in Treatment: 14 Diagnosis Coding ICD-10 Codes Code Description [phone] Non-pressure chronic ulcer of other part of left foot with other specified severity T81.31XD Disruption of external operation (surgical) wound, not elsewhere classified, subsequent encounter E11.621 Type 2 diabetes mellitus with foot ulcer J44.9 Chronic obstructive pulmonary disease, unspecified Facility Procedures CPT4 Code: [id number] Description: 99213 - WOUND CARE VISIT-LEV 3 EST PT Modifier: Quantity:  1 Physician Procedures : CPT4 Code Description Modifier [id number] - WC PHYS LEVEL 3 - EST PT ICD-10 Diagnosis Description L97.528 Non-pressure chronic ulcer of other part of left foot with other specified severity T81.31XD Disruption of external operation (surgical) wound,  not elsewhere classified, subsequent encounter E11.621 Type 2 diabetes mellitus with foot ulcer J44.9 Chronic obstructive pulmonary disease, unspecified Quantity: 1 Electronic Signature(s) Signed: 06/16/2021 4:53:05 PM By: Kalman Shan DO Signed: 06/17/2021 5:45:41 PM By: Levan Hurst RN, BSN Previous Signature: 06/16/2021 1:24:03 PM Version By: Kalman Shan DO Entered By: Levan Hurst on 06/16/2021 16:36:07

## 2021-06-17 NOTE — Progress Notes (Signed)
[name] ([id number]) , Visit Report for 06/16/2021 Arrival Information Details Patient Name: Date of Service: [name], [name] 06/16/2021 12:30 PM Medical Record Number: [id number] Patient Account Number: [id number] Date of Birth/Sex: Treating RN: 1955-09-05 (65 y.o. Janyth Contes Primary Care Zarin Hagmann: Carolee Rota Other Clinician: Referring Unity Luepke: Treating Deshayla Empson/Extender: Maryagnes Amos in Treatment: 14 Visit Information History Since Last Visit Added or deleted any medications: No Patient Arrived: Wheel Chair Any new allergies or adverse reactions: No Arrival Time: 12:39 Had a fall or experienced change in No Accompanied By: alone activities of daily living that may affect Transfer Assistance: Manual risk of falls: Patient Identification Verified: Yes Signs or symptoms of abuse/neglect since last No Secondary Verification Process Completed: Yes visito Patient Requires Transmission-Based Precautions: No Hospitalized since last visit: No Patient Has Alerts: Yes Implantable device outside of the clinic No Patient Alerts: ABI's: R: 0.82 L:0.76 excluding cellular tissue based products placed in the center since last visit: Has Dressing in Place as Prescribed: Yes Has Footwear/Offloading in Place as Yes Prescribed: Left: Removable Cast Walker/Walking Boot Pain Present Now: No Electronic Signature(s) Signed: 06/17/2021 5:45:41 PM By: Levan Hurst RN, BSN Entered By: Levan Hurst on 06/16/2021 16:37:08 -------------------------------------------------------------------------------- Clinic Level of Care Assessment Details Patient Name: Date of Service: [name], [name] 06/16/2021 12:30 PM Medical Record Number: [id number] Patient Account Number: [id number] Date of Birth/Sex: Treating RN: 08-08-1955 (65 y.o. Janyth Contes Primary Care Bates Collington: Carolee Rota Other Clinician: Referring Kimm Ungaro: Treating Jencarlo Bonadonna/Extender:  Maryagnes Amos in Treatment: 14 Clinic Level of Care Assessment Items TOOL 4 Quantity Score X- 1 0 Use when only an EandM is performed on FOLLOW-UP visit ASSESSMENTS - Nursing Assessment / Reassessment X- 1 10 Reassessment of Co-morbidities (includes updates in patient status) X- 1 5 Reassessment of Adherence to Treatment Plan ASSESSMENTS - Wound and Skin A ssessment / Reassessment X - Simple Wound Assessment / Reassessment - one wound 1 5 _0  - 0 Complex Wound Assessment / Reassessment - multiple wounds _1  - 0 Dermatologic / Skin Assessment (not related to wound area) ASSESSMENTS - Focused Assessment _2  - 0 Circumferential Edema Measurements - multi extremities _3  - 0 Nutritional Assessment / Counseling / Intervention X- 1 5 Lower Extremity Assessment (monofilament, tuning fork, pulses) _4  - 0 Peripheral Arterial Disease Assessment (using hand held doppler) ASSESSMENTS - Ostomy and/or Continence Assessment and Care _5  - 0 Incontinence Assessment and Management _6  - 0 Ostomy Care Assessment and Management (repouching, etc.) PROCESS - Coordination of Care X - Simple Patient / Family Education for ongoing care 1 15 _7  - 0 Complex (extensive) Patient / Family Education for ongoing care X- 1 10 Staff obtains Programmer, systems, Records, T Results / Process Orders est X- 1 10 Staff telephones HHA, Nursing Homes / Clarify orders / etc _8  - 0 Routine Transfer to another Facility (non-emergent condition) _9  - 0 Routine Hospital Admission (non-emergent condition) _10  - 0 New Admissions / Biomedical engineer / Ordering NPWT Apligraf, etc. , _11  - 0 Emergency Hospital Admission (emergent condition) X- 1 10 Simple Discharge Coordination _12  - 0 Complex (extensive) Discharge Coordination PROCESS - Special Needs _13  - 0 Pediatric / Minor Patient Management _14  - 0 Isolation Patient Management _15  - 0 Hearing / Language / Visual special needs _16  -  0 Assessment of Community assistance (transportation, D/C planning, etc.) _17  - 0 Additional assistance / Altered mentation _18  - 0 Support Surface(s) Assessment (bed, cushion, seat, etc.) INTERVENTIONS - Wound Cleansing / Measurement X -  Simple Wound Cleansing - one wound 1 5 _0  - 0 Complex Wound Cleansing - multiple wounds X- 1 5 Wound Imaging (photographs - any number of wounds) _1  - 0 Wound Tracing (instead of photographs) X- 1 5 Simple Wound Measurement - one wound _2  - 0 Complex Wound Measurement - multiple wounds INTERVENTIONS - Wound Dressings _3  - 0 Small Wound Dressing one or multiple wounds X- 1 15 Medium Wound Dressing one or multiple wounds _4  - 0 Large Wound Dressing one or multiple wounds <ZOXWRUEAVWUJWJXB>_1<\/YNWGNFAOZHYQMVHQ>_4  - 0 Application of Medications - topical <ONGEXBMWUXLKGMWN>_0<\/UVOZDGUYQIHKVQQV>_9  - 0 Application of Medications - injection INTERVENTIONS - Miscellaneous _7  - 0 External ear exam _8  - 0 Specimen Collection (cultures, biopsies, blood, body fluids, etc.) _9  - 0 Specimen(s) / Culture(s) sent or taken to Lab for analysis _10  - 0 Patient Transfer (multiple staff / Civil Service fast streamer / Similar devices) _11  - 0 Simple Staple / Suture removal (25 or less) _12  - 0 Complex Staple / Suture removal (26 or more) _13  - 0 Hypo / Hyperglycemic Management (close monitor of Blood Glucose) _14  - 0 Ankle / Brachial Index (ABI) - do not check if billed separately X- 1 5 Vital Signs Has the patient been seen at the hospital within the last three years: Yes Total Score: 105 Level Of Care: New/Established - Level 3 Electronic Signature(s) Signed: 06/17/2021 5:45:41 PM By: Levan Hurst RN, BSN Entered By: Levan Hurst on 06/16/2021 16:34:46 -------------------------------------------------------------------------------- Encounter Discharge Information Details Patient Name: Date of Service: [name]. 06/16/2021 12:30 PM Medical Record Number: [id number] Patient Account Number: [id number] Date of Birth/Sex: Treating  RN: Jun 02, 1956 (65 y.o. Janyth Contes Primary Care Chinenye Katzenberger: Carolee Rota Other Clinician: Referring Saquan Furtick: Treating Chaundra Abreu/Extender: Maryagnes Amos in Treatment: 14 Encounter Discharge Information Items Discharge Condition: Stable Ambulatory Status: Wheelchair Discharge Destination: Home Transportation: Private Auto Accompanied By: alone Schedule Follow-up Appointment: Yes Clinical Summary of Care: Patient Declined Electronic Signature(s) Signed: 06/17/2021 5:45:41 PM By: Levan Hurst RN, BSN Entered By: Levan Hurst on 06/16/2021 16:37:00 -------------------------------------------------------------------------------- Lower Extremity Assessment Details Patient Name: Date of Service: [name]. 06/16/2021 12:30 PM Medical Record Number: [id number] Patient Account Number: [id number] Date of Birth/Sex: Treating RN: [birth year] (65 y.o. Janyth Contes Primary Care Christopher Glasscock: Carolee Rota Other Clinician: Referring Jianna Drabik: Treating Gino Garrabrant/Extender: Maryagnes Amos in Treatment: 14 Edema Assessment Assessed: [name]: No] [name]: No] Edema: [Left: Ye] [Right: s] Calf Left: Right: Point of Measurement: 37 cm From Medial Instep 36 cm Ankle Left: Right: Point of Measurement: 8 cm From Medial Instep 23 cm Vascular Assessment Pulses: Dorsalis Pedis Palpable: [Left:Yes] Electronic Signature(s) Signed: 06/17/2021 5:45:41 PM By: Levan Hurst RN, BSN Entered By: Levan Hurst on 06/16/2021 12:49:36 -------------------------------------------------------------------------------- Multi Wound Chart Details Patient Name: Date of Service: [name]. 06/16/2021 12:30 PM Medical Record Number: [id number] Patient Account Number: [id number] Date of Birth/Sex: Treating RN: [birth year] (65 y.o. [name] Primary Care Thompson Mckim: Carolee Rota Other Clinician: Referring Arthea Nobel: Treating  Alezandra Egli/Extender: Maryagnes Amos in Treatment: 14 Vital Signs Height(in): Capillary Blood Glucose(mg/dl): 450 Weight(lbs): Pulse(bpm): 23 Body Mass Index(BMI): Blood Pressure(mmHg): 152/77 Temperature(F): 97.4 Respiratory Rate(breaths/min): 18 Photos: [N/A:N/A] Left Amputation Site - Transmetatarsal N/A N/A Wound Location: Surgical Injury N/A N/A Wounding Event: Dehisced Wound N/A N/A Primary Etiology: Chronic Obstructive Pulmonary N/A N/A Comorbid History: Disease (COPD), Sleep Apnea, Coronary Artery Disease, Hypertension, Type II Diabetes 05/20/2021 N/A N/A Date Acquired: 2 N/A N/A Weeks of Treatment: Open N/A N/A Wound Status: 1.2x5.2x0.8 N/A  N/A Measurements L x W x D (cm) 4.901 N/A N/A A (cm) : rea 3.921 N/A N/A Volume (cm) : 27.40% N/A N/A % Reduction in A rea: -45.10% N/A N/A % Reduction in Volume: 9 Position 1 (o'clock): 2.6 Maximum Distance 1 (cm): Yes N/A N/A Tunneling: Full Thickness Without Exposed N/A N/A Classification: Support Structures Medium N/A N/A Exudate Amount: Serosanguineous N/A N/A Exudate Type: red, brown N/A N/A Exudate Color: Well defined, not attached N/A N/A Wound Margin: Small (1-33%) N/A N/A Granulation Amount: Pink N/A N/A Granulation Quality: Large (67-100%) N/A N/A Necrotic Amount: Fat Layer (Subcutaneous Tissue): Yes N/A N/A Exposed Structures: Fascia: No Tendon: No Muscle: No Joint: No Bone: No Small (1-33%) N/A N/A Epithelialization: Treatment Notes Electronic Signature(s) Signed: 06/16/2021 1:24:03 PM By: Kalman Shan DO Signed: 06/16/2021 6:07:33 PM By: Lorrin Jackson Entered By: Kalman Shan on 06/16/2021 13:17:52 -------------------------------------------------------------------------------- Multi-Disciplinary Care Plan Details Patient Name: Date of Service: [name]. 06/16/2021 12:30 PM Medical Record Number: [id number] Patient Account Number:  [id number] Date of Birth/Sex: Treating RN: November 09, 1955 (65 y.o. Janyth Contes Primary Care Emine Lopata: Carolee Rota Other Clinician: Referring Navie Lamoreaux: Treating Ranata Laughery/Extender: Maryagnes Amos in Treatment: [place] reviewed with physician Active Inactive Wound/Skin Impairment Nursing Diagnoses: Impaired tissue integrity Knowledge deficit related to ulceration/compromised skin integrity Goals: Patient will have a decrease in wound volume by X% from date: (specify in notes) Date Initiated: 03/06/2021 Date Inactivated: 04/15/2021 Target Resolution Date: 03/26/2021 Goal Status: Met Patient/caregiver will verbalize understanding of skin care regimen Date Initiated: 03/06/2021 Target Resolution Date: 06/30/2021 Goal Status: Active Ulcer/skin breakdown will have a volume reduction of 30% by week 4 Date Initiated: 03/06/2021 Date Inactivated: 04/15/2021 Target Resolution Date: 03/26/2021 Goal Status: Unmet Unmet Reason: probable osteomyelitis Ulcer/skin breakdown will have a volume reduction of 50% by week 8 Date Initiated: 04/15/2021 Date Inactivated: 04/21/2021 Target Resolution Date: 04/22/2021 Goal Status: Met Ulcer/skin breakdown will have a volume reduction of 80% by week 12 Date Initiated: 04/21/2021 Target Resolution Date: 06/30/2021 Goal Status: Active Interventions: Assess patient/caregiver ability to obtain necessary supplies Assess patient/caregiver ability to perform ulcer/skin care regimen upon admission and as needed Assess ulceration(s) every visit Notes: 06/02/21: Patient had transmet 10/27. Incision dehisced, wound care ongoing. Electronic Signature(s) Signed: 06/17/2021 5:45:41 PM By: Levan Hurst RN, BSN Entered By: Levan Hurst on 06/16/2021 12:57:30 -------------------------------------------------------------------------------- Pain Assessment Details Patient Name: Date of Service: [name].  06/16/2021 12:30 PM Medical Record Number: [id number] Patient Account Number: [id number] Date of Birth/Sex: Treating RN: [birth year] (65 y.o. Janyth Contes Primary Care Noga Fogg: Carolee Rota Other Clinician: Referring Aleyza Salmi: Treating Sadee Osland/Extender: Maryagnes Amos in Treatment: 14 Active Problems Location of Pain Severity and Description of Pain Patient Has Paino No Site Locations Pain Management and Medication Current Pain Management: Electronic Signature(s) Signed: 06/17/2021 5:45:41 PM By: Levan Hurst RN, BSN Entered By: Levan Hurst on 06/16/2021 12:41:01 -------------------------------------------------------------------------------- Patient/Caregiver Education Details Patient Name: Date of Service: [name] 11/28/2022andnbsp12:30 PM Medical Record Number: [id number] Patient Account Number: [id number] Date of Birth/Gender: Treating RN: 09/01/1955 (65 y.o. Janyth Contes Primary Care Physician: Carolee Rota Other Clinician: Referring Physician: Treating Physician/Extender: Maryagnes Amos in Treatment: 14 Education Assessment Education Provided To: Patient Education Topics Provided Wound/Skin Impairment: Methods: Explain/Verbal Responses: State content correctly [employer]) Signed: 06/17/2021 5:45:41 PM By: Levan Hurst RN, BSN Entered By: Levan Hurst on 06/16/2021 12:57:45 -------------------------------------------------------------------------------- Wound Assessment Details Patient Name: Date of Service: [name]. 06/16/2021 12:30 PM  Medical Record Number: [id number] Patient Account Number: [id number] Date of Birth/Sex: Treating RN: Aug 20, 1955 (65 y.o. Janyth Contes Primary Care Rahul Malinak: Carolee Rota Other Clinician: Referring Asucena Galer: Treating Blu Lori/Extender: Maryagnes Amos in Treatment: 14 Wound Status Wound Number: 7  Primary Dehisced Wound Etiology: Wound Location: Left Amputation Site - Transmetatarsal Wound Open Wounding Event: Surgical Injury Status: Date Acquired: 05/20/2021 Comorbid Chronic Obstructive Pulmonary Disease (COPD), Sleep Apnea, Weeks Of Treatment: 2 History: Coronary Artery Disease, Hypertension, Type II Diabetes Clustered Wound: No Photos Wound Measurements Length: (cm) 1.2 Width: (cm) 5.2 Depth: (cm) 0.8 Area: (cm) 4.901 Volume: (cm) 3.921 % Reduction in Area: 27.4% % Reduction in Volume: -45.1% Epithelialization: Small (1-33%) Tunneling: Yes Position (o'clock): 9 Maximum Distance: (cm) 2.6 Undermining: No Wound Description Classification: Full Thickness Without Exposed Support Structures Wound Margin: Well defined, not attached Exudate Amount: Medium Exudate Type: Serosanguineous Exudate Color: red, brown Foul Odor After Cleansing: No Slough/Fibrino Yes Wound Bed Granulation Amount: Small (1-33%) Exposed Structure Granulation Quality: Pink Fascia Exposed: No Necrotic Amount: Large (67-100%) Fat Layer (Subcutaneous Tissue) Exposed: Yes Necrotic Quality: Adherent Slough Tendon Exposed: No Muscle Exposed: No Joint Exposed: No Bone Exposed: No Treatment Notes Wound #7 (Amputation Site - Transmetatarsal) Wound Laterality: Left Cleanser Soap and Water Discharge Instruction: May shower and wash wound with dial antibacterial soap and water prior to dressing change. Wound Cleanser Discharge Instruction: Cleanse the wound with wound cleanser prior to applying a clean dressing using gauze sponges, not tissue or cotton balls. Peri-Wound Care Topical Primary Dressing Hydrofera Blue Classic Foam, 4x4 in Discharge Instruction: Moisten with saline prior to applying to wound bed Iodoform packing strip 1/4 (in) Discharge Instruction: ****Lightly pack into tunnel at 9 o'clock***** Secondary Dressing Woven Gauze Sponge, Non-Sterile 4x4 in Discharge Instruction: Apply  over primary dressing as directed. ABD Pad, 5x9 Discharge Instruction: Apply over primary dressing as directed. Secured With Elastic Bandage 4 inch (ACE bandage) Discharge Instruction: Secure with ACE bandage as directed. Kerlix Roll Sterile, 4.5x3.1 (in/yd) Discharge Instruction: Secure with Kerlix as directed. Compression Wrap Compression Stockings Add-Ons Electronic Signature(s) Signed: 06/17/2021 5:45:41 PM By: Levan Hurst RN, BSN Entered By: Levan Hurst on 06/16/2021 12:50:00 -------------------------------------------------------------------------------- Vitals Details Patient Name: Date of Service: [name]. 06/16/2021 12:30 PM Medical Record Number: [id number] Patient Account Number: [id number] Date of Birth/Sex: Treating RN: 1956-07-07 (65 y.o. Janyth Contes Primary Care Laquentin Loudermilk: Carolee Rota Other Clinician: Referring Nahara Dona: Treating Lameeka Schleifer/Extender: Maryagnes Amos in Treatment: 14 Vital Signs Time Taken: 12:39 Temperature (F): 97.4 Pulse (bpm): 90 Respiratory Rate (breaths/min): 18 Blood Pressure (mmHg): 152/77 Capillary Blood Glucose (mg/dl): 450 Reference Range: 80 - 120 mg / dl Notes glucose per pt report Electronic Signature(s) Signed: 06/17/2021 5:45:41 PM By: Levan Hurst RN, BSN Entered By: Levan Hurst on 06/16/2021 12:40:15

## 2021-06-18 ENCOUNTER — Other Ambulatory Visit: Payer: Self-pay

## 2021-06-18 ENCOUNTER — Telehealth: Payer: Self-pay | Admitting: Podiatry

## 2021-06-18 ENCOUNTER — Ambulatory Visit (INDEPENDENT_AMBULATORY_CARE_PROVIDER_SITE_OTHER): Payer: Medicare Other | Admitting: Podiatry

## 2021-06-18 ENCOUNTER — Ambulatory Visit (INDEPENDENT_AMBULATORY_CARE_PROVIDER_SITE_OTHER): Payer: Medicare Other

## 2021-06-18 DIAGNOSIS — Z91199 Patient's noncompliance with other medical treatment and regimen due to unspecified reason: Secondary | ICD-10-CM | POA: Diagnosis not present

## 2021-06-18 DIAGNOSIS — Z7902 Long term (current) use of antithrombotics/antiplatelets: Secondary | ICD-10-CM | POA: Diagnosis not present

## 2021-06-18 DIAGNOSIS — Z79891 Long term (current) use of opiate analgesic: Secondary | ICD-10-CM | POA: Diagnosis not present

## 2021-06-18 DIAGNOSIS — Z9889 Other specified postprocedural states: Secondary | ICD-10-CM | POA: Diagnosis not present

## 2021-06-18 DIAGNOSIS — I2510 Atherosclerotic heart disease of native coronary artery without angina pectoris: Secondary | ICD-10-CM | POA: Diagnosis not present

## 2021-06-18 DIAGNOSIS — F17210 Nicotine dependence, cigarettes, uncomplicated: Secondary | ICD-10-CM | POA: Diagnosis not present

## 2021-06-18 DIAGNOSIS — C641 Malignant neoplasm of right kidney, except renal pelvis: Secondary | ICD-10-CM | POA: Diagnosis not present

## 2021-06-18 DIAGNOSIS — J449 Chronic obstructive pulmonary disease, unspecified: Secondary | ICD-10-CM | POA: Diagnosis not present

## 2021-06-18 DIAGNOSIS — M86172 Other acute osteomyelitis, left ankle and foot: Secondary | ICD-10-CM | POA: Diagnosis not present

## 2021-06-18 DIAGNOSIS — Z4801 Encounter for change or removal of surgical wound dressing: Secondary | ICD-10-CM | POA: Diagnosis not present

## 2021-06-18 DIAGNOSIS — Z905 Acquired absence of kidney: Secondary | ICD-10-CM | POA: Diagnosis not present

## 2021-06-18 DIAGNOSIS — E1142 Type 2 diabetes mellitus with diabetic polyneuropathy: Secondary | ICD-10-CM | POA: Diagnosis not present

## 2021-06-18 DIAGNOSIS — Z7984 Long term (current) use of oral hypoglycemic drugs: Secondary | ICD-10-CM | POA: Diagnosis not present

## 2021-06-18 DIAGNOSIS — G4730 Sleep apnea, unspecified: Secondary | ICD-10-CM | POA: Diagnosis not present

## 2021-06-18 DIAGNOSIS — I10 Essential (primary) hypertension: Secondary | ICD-10-CM | POA: Diagnosis not present

## 2021-06-18 DIAGNOSIS — F32A Depression, unspecified: Secondary | ICD-10-CM | POA: Diagnosis not present

## 2021-06-18 DIAGNOSIS — K219 Gastro-esophageal reflux disease without esophagitis: Secondary | ICD-10-CM | POA: Diagnosis not present

## 2021-06-18 DIAGNOSIS — M1990 Unspecified osteoarthritis, unspecified site: Secondary | ICD-10-CM | POA: Diagnosis not present

## 2021-06-18 DIAGNOSIS — Z9181 History of falling: Secondary | ICD-10-CM | POA: Diagnosis not present

## 2021-06-18 DIAGNOSIS — E1169 Type 2 diabetes mellitus with other specified complication: Secondary | ICD-10-CM | POA: Diagnosis not present

## 2021-06-18 DIAGNOSIS — Z89432 Acquired absence of left foot: Secondary | ICD-10-CM | POA: Diagnosis not present

## 2021-06-18 DIAGNOSIS — Z4781 Encounter for orthopedic aftercare following surgical amputation: Secondary | ICD-10-CM | POA: Diagnosis not present

## 2021-06-18 DIAGNOSIS — M869 Osteomyelitis, unspecified: Secondary | ICD-10-CM

## 2021-06-18 MED ORDER — DOXYCYCLINE HYCLATE 100 MG PO TABS: 100.0000 mg | ORAL_TABLET | Freq: Two times a day (BID) | ORAL | 0 refills | Status: AC

## 2021-06-18 NOTE — Progress Notes (Signed)
Subjective:  Patient ID: [name], male    DOB: [birth year],  MRN: [id number]  Chief Complaint  Patient presents with   Routine Post Op     POV #3 DOS 05/05/2021 LT METATARSAL AMPUTATION W/CLOSURE     DOS: 05/05/2021 Procedure: Left transmetatarsal amputation  65 y.o. male returns for post-op check.  Patient states is doing well.  He has not been changing the bandage.  He denies any other acute event the bandage has been clean dry and intact.  He is nonweightbearing to the left lower extremity in a cam boot.  Review of Systems: Negative except as noted in the HPI. Denies N/V/F/Ch.  Past Medical History:  Diagnosis Date   Anxiety    Arthritis    Cholecystitis, acute 12/20/2013   Lap chole 6/5   Cocaine abuse ([name])    COPD (chronic obstructive pulmonary disease) ([name])    Coronary atherosclerosis of native coronary artery    a. 03/09/2013 Cath/PCI: LM nl, LAD: 50p, 35m (2.5x16 promus DES), LCX nl, OM1 min irregs, LPL/LPDA diff dzs, RCA nondom, mod diff dzs, EF 55%.   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 ([name]) 04/24/2021   History of nephrolithiasis    History of pneumonia    History of stroke    Right MCA distribution, residual left-sided weakness   Hyperlipidemia    Noncompliance    Osteomyelitis of second toe of left foot ([place]) 04/24/2021   Renal cell carcinoma ([name])    Status post radical right nephrectomy August 2015   Sleep apnea    On CPAP, 4L O2 no cpap at home yet   Type 2 diabetes mellitus ([place]) 2011    Current Outpatient Medications:    doxycycline (VIBRA-TABS) 100 MG tablet, Take 1 tablet (100 mg total) by mouth 2 (two) times daily., Disp: 60 tablet, Rfl: 0   acetaminophen (TYLENOL) 325 MG tablet, Take 650 mg by mouth as needed (pain)., Disp: , Rfl:    ALPRAZolam (XANAX) 0.5 MG tablet, Take 1 tablet (0.5 mg total) by  mouth 3 (three) times daily as needed for anxiety. (Patient not taking: Reported on 06/05/2021), Disp: , Rfl: 0   ALPRAZolam (XANAX) 1 MG tablet, Take 1 mg by mouth 3 (three) times daily as needed., Disp: , Rfl:    atorvastatin (LIPITOR) 20 MG tablet, Take 20 mg by mouth at bedtime., Disp: , Rfl: 1   azithromycin (ZITHROMAX) 250 MG tablet, Take 2 tablets first day then 1 tablet daily until gone, Disp: 6 tablet, Rfl: 0   Budeson-Glycopyrrol-Formoterol (BREZTRI AEROSPHERE) 160-9-4.8 MCG/ACT AERO, Inhale 2 puffs into the lungs in the morning and at bedtime., Disp: 10.7 g, Rfl: 5   budesonide (PULMICORT) 1 MG/2ML nebulizer solution, Take 1 mg by nebulization as needed (shortness of breath)., Disp: , Rfl:    clopidogrel (PLAVIX) 75 MG tablet, TAKE ONE TABLET BY MOUTH EVERY DAY, Disp: 90 tablet, Rfl: 2   diltiazem (CARDIZEM CD) 180 MG 24 hr capsule, TAKE ONE CAPSULE BY MOUTH EVERY DAY, Disp: 90 capsule, Rfl: 2   diphenhydrAMINE (BENADRYL) 25 mg capsule, Take 25 mg by mouth every 6 (six) hours as needed for itching (take with percocet)., Disp: , Rfl:    docusate sodium (COLACE) 100 MG capsule, Take 200 mg by mouth at bedtime. , Disp: , Rfl:    esomeprazole (  NEXIUM) 40 MG capsule, Take 40 mg by mouth at bedtime. Pt only take as needed, Disp: , Rfl:    FLUoxetine (PROZAC) 20 MG capsule, Take by mouth., Disp: , Rfl:    FLUoxetine (PROZAC) 40 MG capsule, Take 40 mg by mouth daily. Take with 10mg  capsule for a total of 50mg  daily., Disp: , Rfl:    formoterol (PERFOROMIST) 20 MCG/2ML nebulizer solution, Take 20 mcg by nebulization as needed (shortness of breath)., Disp: , Rfl:    gabapentin (NEURONTIN) 300 MG capsule, Take 900 mg by mouth 3 (three) times daily., Disp: , Rfl:    glucose blood (TRUE METRIX BLOOD GLUCOSE TEST) test strip, , Disp: , Rfl:    insulin regular human CONCENTRATED (HUMULIN R) 500 UNIT/ML injection, Inject 90 Units into the skin 3 (three) times daily with meals., Disp: , Rfl:    isosorbide  mononitrate (IMDUR) 60 MG 24 hr tablet, Take 1 tablet (60 mg total) by mouth daily., Disp: , Rfl:    linagliptin (TRADJENTA) 5 MG TABS tablet, Take 5 mg by mouth daily., Disp: , Rfl:    nitroGLYCERIN (NITROSTAT) 0.4 MG SL tablet, PLACE ONE tablet UNDER THE TONGUE every FIVE minutes as needed FOR CHEST PAIN. IF 3 TABLETS ARE NECESSARY CALL 911, Disp: 25 tablet, Rfl: 3   oxybutynin (DITROPAN-XL) 10 MG 24 hr tablet, Take 10 mg by mouth daily., Disp: , Rfl: 11   potassium chloride SA (K-DUR,KLOR-CON) 20 MEQ tablet, Take 20 mEq by mouth 2 (two) times daily. , Disp: , Rfl:    pregabalin (LYRICA) 25 MG capsule, Take 25 mg by mouth 2 (two) times daily., Disp: , Rfl:    Probiotic Product (PROBIOTIC PO), Take 1 capsule by mouth daily., Disp: , Rfl:    sucralfate (CARAFATE) 1 g tablet, TAKE ONE TABLET BY MOUTH TWICE DAILY ON an empty stomach, Disp: , Rfl:    tamsulosin (FLOMAX) 0.4 MG CAPS capsule, Take 0.4 mg by mouth daily. , Disp: , Rfl:    torsemide (DEMADEX) 20 MG tablet, TAKE THREE TABLETS BY MOUTH TWICE DAILY, Disp: 540 tablet, Rfl: 0   zolpidem (AMBIEN) 5 MG tablet, Take 5 mg by mouth at bedtime. For insomnia., Disp: , Rfl:   Social History   Tobacco Use  Smoking Status Every Day   Packs/day: 1.00   Years: 41.00   Pack years: 41.00   Types: Cigarettes   Start date: 05/03/1972  Smokeless Tobacco Never  Tobacco Comments   1/2 ppd 06/05/21    Allergies  Allergen Reactions   Lisinopril Other (See Comments)    Other reaction(s): worsening kidney function   Oxycodone Itching and Nausea Only    Pt is able to tolerate if taken with benadryl   Objective:  There were no vitals filed for this visit. There is no height or weight on file to calculate BMI. Constitutional Well developed. Well nourished.  Vascular Foot warm and well perfused. Capillary refill normal to all digits.   Neurologic Normal speech. Oriented to person, place, and time. Epicritic sensation to light touch grossly  present bilaterally.  Dermatologic Deep dehiscence to the lateral aspect of the transmetatarsal amputation noted.  No probing down to deep tissue or bone noted.  No purulent drainage noted no redness noted.  Orthopedic: No further tenderness to palpation noted about the surgical site.   Radiographs: 3 views of skeletally mature adult left foot: Transmetatarsal amputation noted.  No signs of osteomyelitis noted. Assessment:   1. Status post foot surgery  2. Osteomyelitis of second toe of left foot ([place])     Plan:  Patient was evaluated and treated and all questions answered.  S/p foot surgery left transmetatarsal amputation - -Continue primary management of the wound care center for the dehiscence.  The wound does probe down to deep tissue.  No radiographic signs of osteomyelitis noted at this time.  No purulent drainage was clinically appreciated.  At this time I am hoping patient can be closed with local wound care.  The primary determinant factor is uncontrolled sugars as making this very difficult. -Patient is a high risk of losing the leg if this regresses.  No follow-ups on file.

## 2021-06-18 NOTE — Telephone Encounter (Signed)
Patient is requesting you extend the order for his home health care - he would like you to extend the order until 12/21 with [place]   Fax order to [phone]

## 2021-06-19 NOTE — Telephone Encounter (Signed)
Called [facility], spoke with Arbie Cookey giving verbal to extend home health order,she verbalized understanding.

## 2021-06-20 DIAGNOSIS — K219 Gastro-esophageal reflux disease without esophagitis: Secondary | ICD-10-CM | POA: Diagnosis not present

## 2021-06-20 DIAGNOSIS — E1169 Type 2 diabetes mellitus with other specified complication: Secondary | ICD-10-CM | POA: Diagnosis not present

## 2021-06-20 DIAGNOSIS — Z7902 Long term (current) use of antithrombotics/antiplatelets: Secondary | ICD-10-CM | POA: Diagnosis not present

## 2021-06-20 DIAGNOSIS — J449 Chronic obstructive pulmonary disease, unspecified: Secondary | ICD-10-CM | POA: Diagnosis not present

## 2021-06-20 DIAGNOSIS — Z89432 Acquired absence of left foot: Secondary | ICD-10-CM | POA: Diagnosis not present

## 2021-06-20 DIAGNOSIS — F17210 Nicotine dependence, cigarettes, uncomplicated: Secondary | ICD-10-CM | POA: Diagnosis not present

## 2021-06-20 DIAGNOSIS — F32A Depression, unspecified: Secondary | ICD-10-CM | POA: Diagnosis not present

## 2021-06-20 DIAGNOSIS — Z79891 Long term (current) use of opiate analgesic: Secondary | ICD-10-CM | POA: Diagnosis not present

## 2021-06-20 DIAGNOSIS — M86172 Other acute osteomyelitis, left ankle and foot: Secondary | ICD-10-CM | POA: Diagnosis not present

## 2021-06-20 DIAGNOSIS — C641 Malignant neoplasm of right kidney, except renal pelvis: Secondary | ICD-10-CM | POA: Diagnosis not present

## 2021-06-20 DIAGNOSIS — E1142 Type 2 diabetes mellitus with diabetic polyneuropathy: Secondary | ICD-10-CM | POA: Diagnosis not present

## 2021-06-20 DIAGNOSIS — Z7984 Long term (current) use of oral hypoglycemic drugs: Secondary | ICD-10-CM | POA: Diagnosis not present

## 2021-06-20 DIAGNOSIS — Z91199 Patient's noncompliance with other medical treatment and regimen due to unspecified reason: Secondary | ICD-10-CM | POA: Diagnosis not present

## 2021-06-20 DIAGNOSIS — Z905 Acquired absence of kidney: Secondary | ICD-10-CM | POA: Diagnosis not present

## 2021-06-20 DIAGNOSIS — G4730 Sleep apnea, unspecified: Secondary | ICD-10-CM | POA: Diagnosis not present

## 2021-06-20 DIAGNOSIS — I2510 Atherosclerotic heart disease of native coronary artery without angina pectoris: Secondary | ICD-10-CM | POA: Diagnosis not present

## 2021-06-20 DIAGNOSIS — Z4781 Encounter for orthopedic aftercare following surgical amputation: Secondary | ICD-10-CM | POA: Diagnosis not present

## 2021-06-20 DIAGNOSIS — M1990 Unspecified osteoarthritis, unspecified site: Secondary | ICD-10-CM | POA: Diagnosis not present

## 2021-06-20 DIAGNOSIS — Z9181 History of falling: Secondary | ICD-10-CM | POA: Diagnosis not present

## 2021-06-20 DIAGNOSIS — I10 Essential (primary) hypertension: Secondary | ICD-10-CM | POA: Diagnosis not present

## 2021-06-20 DIAGNOSIS — Z4801 Encounter for change or removal of surgical wound dressing: Secondary | ICD-10-CM | POA: Diagnosis not present

## 2021-06-23 ENCOUNTER — Other Ambulatory Visit: Payer: Self-pay

## 2021-06-23 ENCOUNTER — Encounter (HOSPITAL_BASED_OUTPATIENT_CLINIC_OR_DEPARTMENT_OTHER): Payer: Medicare Other | Attending: Internal Medicine | Admitting: Internal Medicine

## 2021-06-23 DIAGNOSIS — E11621 Type 2 diabetes mellitus with foot ulcer: Secondary | ICD-10-CM | POA: Diagnosis not present

## 2021-06-23 DIAGNOSIS — I5032 Chronic diastolic (congestive) heart failure: Secondary | ICD-10-CM | POA: Diagnosis not present

## 2021-06-23 DIAGNOSIS — E1122 Type 2 diabetes mellitus with diabetic chronic kidney disease: Secondary | ICD-10-CM | POA: Insufficient documentation

## 2021-06-23 DIAGNOSIS — Y839 Surgical procedure, unspecified as the cause of abnormal reaction of the patient, or of later complication, without mention of misadventure at the time of the procedure: Secondary | ICD-10-CM | POA: Diagnosis not present

## 2021-06-23 DIAGNOSIS — F17210 Nicotine dependence, cigarettes, uncomplicated: Secondary | ICD-10-CM | POA: Insufficient documentation

## 2021-06-23 DIAGNOSIS — Z794 Long term (current) use of insulin: Secondary | ICD-10-CM | POA: Diagnosis not present

## 2021-06-23 DIAGNOSIS — J449 Chronic obstructive pulmonary disease, unspecified: Secondary | ICD-10-CM | POA: Diagnosis not present

## 2021-06-23 DIAGNOSIS — T8131XA Disruption of external operation (surgical) wound, not elsewhere classified, initial encounter: Secondary | ICD-10-CM | POA: Diagnosis not present

## 2021-06-23 DIAGNOSIS — I130 Hypertensive heart and chronic kidney disease with heart failure and stage 1 through stage 4 chronic kidney disease, or unspecified chronic kidney disease: Secondary | ICD-10-CM | POA: Insufficient documentation

## 2021-06-23 DIAGNOSIS — L97528 Non-pressure chronic ulcer of other part of left foot with other specified severity: Secondary | ICD-10-CM | POA: Diagnosis present

## 2021-06-23 DIAGNOSIS — N183 Chronic kidney disease, stage 3 unspecified: Secondary | ICD-10-CM | POA: Diagnosis not present

## 2021-06-23 DIAGNOSIS — E1165 Type 2 diabetes mellitus with hyperglycemia: Secondary | ICD-10-CM | POA: Insufficient documentation

## 2021-06-24 DIAGNOSIS — J449 Chronic obstructive pulmonary disease, unspecified: Secondary | ICD-10-CM | POA: Diagnosis not present

## 2021-06-24 NOTE — Progress Notes (Signed)
[name] ([id number]) , Visit Report for 06/23/2021 SuperBill Details Patient Name: Date of Service: [name], [name] 06/23/2021 Medical Record Number: [id number] Patient Account Number: [id number] Date of Birth/Sex: Treating RN: Jul 27, 1955 (65 y.o. [name] Primary Care Provider: Carolee Rota Other Clinician: Referring Provider: Treating Provider/Extender: Maryagnes Amos in Treatment: 15 Diagnosis Coding ICD-10 Codes Code Description [phone] Non-pressure chronic ulcer of other part of left foot with other specified severity T81.31XD Disruption of external operation (surgical) wound, not elsewhere classified, subsequent encounter E11.621 Type 2 diabetes mellitus with foot ulcer J44.9 Chronic obstructive pulmonary disease, unspecified Facility Procedures CPT4 Code Description Modifier Quantity [id number] [phone] - WOUND CARE VISIT-LEV 3 EST PT 1 Electronic Signature(s) Signed: 06/23/2021 2:38:54 PM By: Deon Pilling RN, BSN Signed: 06/24/2021 3:41:17 PM By: Kalman Shan DO Entered By: Deon Pilling on 06/23/2021 13:22:34

## 2021-06-24 NOTE — Progress Notes (Signed)
[name] ([id number]) , Visit Report for 06/23/2021 Arrival Information Details Patient Name: Date of Service: [name], [name] 06/23/2021 12:30 PM Medical Record Number: [id number] Patient Account Number: [id number] Date of Birth/Sex: Treating RN: 28-Dec-1955 (65 y.o. [name] Primary Care Cleotis Sparr: Carolee Rota Other Clinician: Referring Lariza Cothron: Treating [name]/Extender: Maryagnes Amos in Treatment: 15 Visit Information History Since Last Visit Added or deleted any medications: No Patient Arrived: Wheel Chair Any new allergies or adverse reactions: No Arrival Time: 12:48 Had a fall or experienced change in No Accompanied By: caregiver/sister-in-law activities of daily living that may affect Transfer Assistance: None risk of falls: Patient Requires Transmission-Based Precautions: No Signs or symptoms of abuse/neglect since last visito No Patient Has Alerts: Yes Hospitalized since last visit: No Patient Alerts: ABI's: R: 0.82 L:0.76 Implantable device outside of the clinic excluding No cellular tissue based products placed in the center since last visit: Has Dressing in Place as Prescribed: Yes Pain Present Now: No Electronic Signature(s) Signed: 06/24/2021 5:02:41 PM By: Dellie Catholic RN Entered By: Dellie Catholic on 06/23/2021 12:49:44 -------------------------------------------------------------------------------- Clinic Level of Care Assessment Details Patient Name: Date of Service: [name], [name] 06/23/2021 12:30 PM Medical Record Number: [id number] Patient Account Number: [id number] Date of Birth/Sex: Treating RN: 03-31-1956 (65 y.o. [name] Primary Care Christee Mervine: Carolee Rota Other Clinician: Referring Murrell Dome: Treating Asher Torpey/Extender: Maryagnes Amos in Treatment: 15 Clinic Level of Care Assessment Items TOOL 4 Quantity Score X- 1 0 Use when only an EandM is performed on  FOLLOW-UP visit ASSESSMENTS - Nursing Assessment / Reassessment X- 1 10 Reassessment of Co-morbidities (includes updates in patient status) X- 1 5 Reassessment of Adherence to Treatment Plan ASSESSMENTS - Wound and Skin A ssessment / Reassessment X - Simple Wound Assessment / Reassessment - one wound 1 5 []  - 0 Complex Wound Assessment / Reassessment - multiple wounds X- 1 10 Dermatologic / Skin Assessment (not related to wound area) ASSESSMENTS - Focused Assessment []  - 0 Circumferential Edema Measurements - multi extremities []  - 0 Nutritional Assessment / Counseling / Intervention []  - 0 Lower Extremity Assessment (monofilament, tuning fork, pulses) []  - 0 Peripheral Arterial Disease Assessment (using hand held doppler) ASSESSMENTS - Ostomy and/or Continence Assessment and Care []  - 0 Incontinence Assessment and Management []  - 0 Ostomy Care Assessment and Management (repouching, etc.) PROCESS - Coordination of Care X - Simple Patient / Family Education for ongoing care 1 15 []  - 0 Complex (extensive) Patient / Family Education for ongoing care X- 1 10 Staff obtains Programmer, systems, Records, T Results / Process Orders est []  - 0 Staff telephones HHA, Nursing Homes / Clarify orders / etc []  - 0 Routine Transfer to another Facility (non-emergent condition) []  - 0 Routine Hospital Admission (non-emergent condition) []  - 0 New Admissions / Biomedical engineer / Ordering NPWT Apligraf, etc. , []  - 0 Emergency Hospital Admission (emergent condition) X- 1 10 Simple Discharge Coordination []  - 0 Complex (extensive) Discharge Coordination PROCESS - Special Needs []  - 0 Pediatric / Minor Patient Management []  - 0 Isolation Patient Management []  - 0 Hearing / Language / Visual special needs []  - 0 Assessment of Community assistance (transportation, D/C planning, etc.) []  - 0 Additional assistance / Altered mentation []  - 0 Support Surface(s) Assessment (bed, cushion,  seat, etc.) INTERVENTIONS - Wound Cleansing / Measurement X - Simple Wound Cleansing - one wound 1 5 []  - 0 Complex Wound Cleansing - multiple wounds X- 1 5 Wound  Imaging (photographs - any number of wounds) []  - 0 Wound Tracing (instead of photographs) X- 1 5 Simple Wound Measurement - one wound []  - 0 Complex Wound Measurement - multiple wounds INTERVENTIONS - Wound Dressings []  - 0 Small Wound Dressing one or multiple wounds X- 1 15 Medium Wound Dressing one or multiple wounds []  - 0 Large Wound Dressing one or multiple wounds []  - 0 Application of Medications - topical []  - 0 Application of Medications - injection INTERVENTIONS - Miscellaneous []  - 0 External ear exam []  - 0 Specimen Collection (cultures, biopsies, blood, body fluids, etc.) []  - 0 Specimen(s) / Culture(s) sent or taken to Lab for analysis []  - 0 Patient Transfer (multiple staff / Civil Service fast streamer / Similar devices) []  - 0 Simple Staple / Suture removal (25 or less) []  - 0 Complex Staple / Suture removal (26 or more) []  - 0 Hypo / Hyperglycemic Management (close monitor of Blood Glucose) []  - 0 Ankle / Brachial Index (ABI) - do not check if billed separately X- 1 5 Vital Signs Has the patient been seen at the hospital within the last three years: Yes Total Score: 100 Level Of Care: New/Established - Level 3 Electronic Signature(s) Signed: 06/23/2021 2:38:54 PM By: Deon Pilling RN, BSN Entered By: Deon Pilling on 06/23/2021 13:22:23 -------------------------------------------------------------------------------- Encounter Discharge Information Details Patient Name: Date of Service: Kevin Brookes. 06/23/2021 12:30 PM Medical Record Number: [id number] Patient Account Number: [id number] Date of Birth/Sex: Treating RN: Jul 23, 1955 (65 y.o. [name] Primary Care Nijel Flink: Carolee Rota Other Clinician: Referring Jamylah Marinaccio: Treating Catlin Doria/Extender: Maryagnes Amos  in Treatment: 15 Encounter Discharge Information Items Discharge Condition: Stable Ambulatory Status: Wheelchair Discharge Destination: Home Transportation: Private Auto Accompanied By: sister-in-law Schedule Follow-up Appointment: Yes Clinical Summary of Care: Patient Declined Electronic Signature(s) Signed: 06/24/2021 5:02:41 PM By: Dellie Catholic RN Entered By: Dellie Catholic on 06/23/2021 13:16:02 -------------------------------------------------------------------------------- Patient/Caregiver Education Details Patient Name: Date of Service: Kevin Brookes 12/5/2022andnbsp12:30 PM Medical Record Number: [id number] Patient Account Number: [id number] Date of Birth/Gender: Treating RN: 04-26-1956 (65 y.o. [name] Primary Care Physician: Carolee Rota Other Clinician: Referring Physician: Treating Physician/Extender: Maryagnes Amos in Treatment: 15 Education Assessment Education Provided To: Patient Education Topics Provided Pain: Methods: Explain/Verbal Electronic Signature(s) Signed: 06/24/2021 5:02:41 PM By: Dellie Catholic RN Entered By: Dellie Catholic on 06/23/2021 13:15:26 -------------------------------------------------------------------------------- Wound Assessment Details Patient Name: Date of Service: [name], [name] 06/23/2021 12:30 PM Medical Record Number: [id number] Patient Account Number: [id number] Date of Birth/Sex: Treating RN: 02-Sep-1955 (65 y.o. [name] Primary Care Nyia Tsao: Carolee Rota Other Clinician: Referring Narayan Scull: Treating Hero Kulish/Extender: Maryagnes Amos in Treatment: 15 Wound Status Wound Number: 7 Primary Dehisced Wound Etiology: Wound Location: Left Amputation Site - Transmetatarsal Wound Open Wounding Event: Surgical Injury Status: Date Acquired: 05/20/2021 Comorbid Chronic Obstructive Pulmonary Disease (COPD), Sleep Apnea, Weeks Of Treatment:  3 History: Coronary Artery Disease, Hypertension, Type II Diabetes Clustered Wound: No Wound Measurements Length: (cm) 1.2 Width: (cm) 5.2 Depth: (cm) 0.8 Area: (cm) 4.901 Volume: (cm) 3.921 % Reduction in Area: 27.4% % Reduction in Volume: -45.1% Epithelialization: Small (1-33%) Wound Description Classification: Full Thickness Without Exposed Support Structures Wound Margin: Well defined, not attached Exudate Amount: Medium Exudate Type: Serosanguineous Exudate Color: red, brown Foul Odor After Cleansing: No Slough/Fibrino Yes Wound Bed Granulation Amount: Small (1-33%) Exposed Structure Granulation Quality: Pink Fascia Exposed: No Necrotic Amount: Large (67-100%) Fat Layer (Subcutaneous Tissue) Exposed: Yes Necrotic Quality: Adherent Slough  Tendon Exposed: No Muscle Exposed: No Joint Exposed: No Bone Exposed: No Treatment Notes Wound #7 (Amputation Site - Transmetatarsal) Wound Laterality: Left Cleanser Soap and Water Discharge Instruction: May shower and wash wound with dial antibacterial soap and water prior to dressing change. Wound Cleanser Discharge Instruction: Cleanse the wound with wound cleanser prior to applying a clean dressing using gauze sponges, not tissue or cotton balls. Peri-Wound Care Topical Primary Dressing Hydrofera Blue Classic Foam, 4x4 in Discharge Instruction: Moisten with saline prior to applying to wound bed Iodoform packing strip 1/4 (in) Discharge Instruction: ****Lightly pack into tunnel at 9 o'clock***** Secondary Dressing Woven Gauze Sponge, Non-Sterile 4x4 in Discharge Instruction: Apply over primary dressing as directed. ABD Pad, 5x9 Discharge Instruction: Apply over primary dressing as directed. Secured With Elastic Bandage 4 inch (ACE bandage) Discharge Instruction: Secure with ACE bandage as directed. Kerlix Roll Sterile, 4.5x3.1 (in/yd) Discharge Instruction: Secure with Kerlix as directed. Compression Wrap Compression  Stockings Add-Ons Electronic Signature(s) Signed: 06/24/2021 5:02:41 PM By: Dellie Catholic RN Entered By: Dellie Catholic on 06/23/2021 12:57:15 -------------------------------------------------------------------------------- Vitals Details Patient Name: Date of Service: Kevin Brookes. 06/23/2021 12:30 PM Medical Record Number: [id number] Patient Account Number: [id number] Date of Birth/Sex: Treating RN: 1955-10-17 (65 y.o. [name] Primary Care Fleetwood Pierron: Carolee Rota Other Clinician: Referring Irene Mitcham: Treating Zaryan Yakubov/Extender: Maryagnes Amos in Treatment: 15 Vital Signs Time Taken: 13:49 Temperature (F): 98.6 Pulse (bpm): 100 Respiratory Rate (breaths/min): 18 Blood Pressure (mmHg): 160/86 Reference Range: 80 - 120 mg / dl Electronic Signature(s) Signed: 06/24/2021 5:02:41 PM By: Dellie Catholic RN Entered By: Dellie Catholic on 06/23/2021 12:50:15

## 2021-06-25 DIAGNOSIS — I2510 Atherosclerotic heart disease of native coronary artery without angina pectoris: Secondary | ICD-10-CM | POA: Diagnosis not present

## 2021-06-25 DIAGNOSIS — Z7902 Long term (current) use of antithrombotics/antiplatelets: Secondary | ICD-10-CM | POA: Diagnosis not present

## 2021-06-25 DIAGNOSIS — G4730 Sleep apnea, unspecified: Secondary | ICD-10-CM | POA: Diagnosis not present

## 2021-06-25 DIAGNOSIS — M1990 Unspecified osteoarthritis, unspecified site: Secondary | ICD-10-CM | POA: Diagnosis not present

## 2021-06-25 DIAGNOSIS — E1142 Type 2 diabetes mellitus with diabetic polyneuropathy: Secondary | ICD-10-CM | POA: Diagnosis not present

## 2021-06-25 DIAGNOSIS — E1169 Type 2 diabetes mellitus with other specified complication: Secondary | ICD-10-CM | POA: Diagnosis not present

## 2021-06-25 DIAGNOSIS — K219 Gastro-esophageal reflux disease without esophagitis: Secondary | ICD-10-CM | POA: Diagnosis not present

## 2021-06-25 DIAGNOSIS — M86172 Other acute osteomyelitis, left ankle and foot: Secondary | ICD-10-CM | POA: Diagnosis not present

## 2021-06-25 DIAGNOSIS — F17210 Nicotine dependence, cigarettes, uncomplicated: Secondary | ICD-10-CM | POA: Diagnosis not present

## 2021-06-25 DIAGNOSIS — Z91199 Patient's noncompliance with other medical treatment and regimen due to unspecified reason: Secondary | ICD-10-CM | POA: Diagnosis not present

## 2021-06-25 DIAGNOSIS — F32A Depression, unspecified: Secondary | ICD-10-CM | POA: Diagnosis not present

## 2021-06-25 DIAGNOSIS — J449 Chronic obstructive pulmonary disease, unspecified: Secondary | ICD-10-CM | POA: Diagnosis not present

## 2021-06-25 DIAGNOSIS — Z4781 Encounter for orthopedic aftercare following surgical amputation: Secondary | ICD-10-CM | POA: Diagnosis not present

## 2021-06-25 DIAGNOSIS — Z9181 History of falling: Secondary | ICD-10-CM | POA: Diagnosis not present

## 2021-06-25 DIAGNOSIS — Z7984 Long term (current) use of oral hypoglycemic drugs: Secondary | ICD-10-CM | POA: Diagnosis not present

## 2021-06-25 DIAGNOSIS — Z4801 Encounter for change or removal of surgical wound dressing: Secondary | ICD-10-CM | POA: Diagnosis not present

## 2021-06-25 DIAGNOSIS — C641 Malignant neoplasm of right kidney, except renal pelvis: Secondary | ICD-10-CM | POA: Diagnosis not present

## 2021-06-25 DIAGNOSIS — Z905 Acquired absence of kidney: Secondary | ICD-10-CM | POA: Diagnosis not present

## 2021-06-25 DIAGNOSIS — I10 Essential (primary) hypertension: Secondary | ICD-10-CM | POA: Diagnosis not present

## 2021-06-25 DIAGNOSIS — Z79891 Long term (current) use of opiate analgesic: Secondary | ICD-10-CM | POA: Diagnosis not present

## 2021-06-25 DIAGNOSIS — Z89432 Acquired absence of left foot: Secondary | ICD-10-CM | POA: Diagnosis not present

## 2021-06-26 ENCOUNTER — Other Ambulatory Visit: Payer: Self-pay | Admitting: Cardiology

## 2021-06-27 DIAGNOSIS — E1169 Type 2 diabetes mellitus with other specified complication: Secondary | ICD-10-CM | POA: Diagnosis not present

## 2021-06-27 DIAGNOSIS — Z79891 Long term (current) use of opiate analgesic: Secondary | ICD-10-CM | POA: Diagnosis not present

## 2021-06-27 DIAGNOSIS — Z9181 History of falling: Secondary | ICD-10-CM | POA: Diagnosis not present

## 2021-06-27 DIAGNOSIS — Z7984 Long term (current) use of oral hypoglycemic drugs: Secondary | ICD-10-CM | POA: Diagnosis not present

## 2021-06-27 DIAGNOSIS — M86172 Other acute osteomyelitis, left ankle and foot: Secondary | ICD-10-CM | POA: Diagnosis not present

## 2021-06-27 DIAGNOSIS — Z905 Acquired absence of kidney: Secondary | ICD-10-CM | POA: Diagnosis not present

## 2021-06-27 DIAGNOSIS — Z4781 Encounter for orthopedic aftercare following surgical amputation: Secondary | ICD-10-CM | POA: Diagnosis not present

## 2021-06-27 DIAGNOSIS — I2510 Atherosclerotic heart disease of native coronary artery without angina pectoris: Secondary | ICD-10-CM | POA: Diagnosis not present

## 2021-06-27 DIAGNOSIS — Z4801 Encounter for change or removal of surgical wound dressing: Secondary | ICD-10-CM | POA: Diagnosis not present

## 2021-06-27 DIAGNOSIS — F32A Depression, unspecified: Secondary | ICD-10-CM | POA: Diagnosis not present

## 2021-06-27 DIAGNOSIS — K219 Gastro-esophageal reflux disease without esophagitis: Secondary | ICD-10-CM | POA: Diagnosis not present

## 2021-06-27 DIAGNOSIS — I10 Essential (primary) hypertension: Secondary | ICD-10-CM | POA: Diagnosis not present

## 2021-06-27 DIAGNOSIS — M1990 Unspecified osteoarthritis, unspecified site: Secondary | ICD-10-CM | POA: Diagnosis not present

## 2021-06-27 DIAGNOSIS — C641 Malignant neoplasm of right kidney, except renal pelvis: Secondary | ICD-10-CM | POA: Diagnosis not present

## 2021-06-27 DIAGNOSIS — G4730 Sleep apnea, unspecified: Secondary | ICD-10-CM | POA: Diagnosis not present

## 2021-06-27 DIAGNOSIS — E1142 Type 2 diabetes mellitus with diabetic polyneuropathy: Secondary | ICD-10-CM | POA: Diagnosis not present

## 2021-06-27 DIAGNOSIS — Z91199 Patient's noncompliance with other medical treatment and regimen due to unspecified reason: Secondary | ICD-10-CM | POA: Diagnosis not present

## 2021-06-27 DIAGNOSIS — J449 Chronic obstructive pulmonary disease, unspecified: Secondary | ICD-10-CM | POA: Diagnosis not present

## 2021-06-27 DIAGNOSIS — Z7902 Long term (current) use of antithrombotics/antiplatelets: Secondary | ICD-10-CM | POA: Diagnosis not present

## 2021-06-27 DIAGNOSIS — F17210 Nicotine dependence, cigarettes, uncomplicated: Secondary | ICD-10-CM | POA: Diagnosis not present

## 2021-06-27 DIAGNOSIS — Z89432 Acquired absence of left foot: Secondary | ICD-10-CM | POA: Diagnosis not present

## 2021-06-29 DIAGNOSIS — E1140 Type 2 diabetes mellitus with diabetic neuropathy, unspecified: Secondary | ICD-10-CM | POA: Diagnosis not present

## 2021-06-29 DIAGNOSIS — G629 Polyneuropathy, unspecified: Secondary | ICD-10-CM | POA: Diagnosis not present

## 2021-06-29 DIAGNOSIS — J449 Chronic obstructive pulmonary disease, unspecified: Secondary | ICD-10-CM | POA: Diagnosis not present

## 2021-06-29 DIAGNOSIS — G8929 Other chronic pain: Secondary | ICD-10-CM | POA: Diagnosis not present

## 2021-06-30 ENCOUNTER — Other Ambulatory Visit: Payer: Self-pay

## 2021-06-30 ENCOUNTER — Encounter (HOSPITAL_BASED_OUTPATIENT_CLINIC_OR_DEPARTMENT_OTHER): Payer: Medicare Other | Admitting: Internal Medicine

## 2021-06-30 ENCOUNTER — Other Ambulatory Visit: Payer: Self-pay | Admitting: Cardiology

## 2021-06-30 DIAGNOSIS — T8131XD Disruption of external operation (surgical) wound, not elsewhere classified, subsequent encounter: Secondary | ICD-10-CM

## 2021-06-30 DIAGNOSIS — E1122 Type 2 diabetes mellitus with diabetic chronic kidney disease: Secondary | ICD-10-CM | POA: Diagnosis not present

## 2021-06-30 DIAGNOSIS — J849 Interstitial pulmonary disease, unspecified: Secondary | ICD-10-CM | POA: Diagnosis not present

## 2021-06-30 DIAGNOSIS — F17210 Nicotine dependence, cigarettes, uncomplicated: Secondary | ICD-10-CM | POA: Diagnosis not present

## 2021-06-30 DIAGNOSIS — I10 Essential (primary) hypertension: Secondary | ICD-10-CM | POA: Diagnosis not present

## 2021-06-30 DIAGNOSIS — G8929 Other chronic pain: Secondary | ICD-10-CM | POA: Diagnosis not present

## 2021-06-30 DIAGNOSIS — E1165 Type 2 diabetes mellitus with hyperglycemia: Secondary | ICD-10-CM | POA: Diagnosis not present

## 2021-06-30 DIAGNOSIS — L97528 Non-pressure chronic ulcer of other part of left foot with other specified severity: Secondary | ICD-10-CM | POA: Diagnosis not present

## 2021-06-30 DIAGNOSIS — G629 Polyneuropathy, unspecified: Secondary | ICD-10-CM | POA: Diagnosis not present

## 2021-06-30 DIAGNOSIS — J9611 Chronic respiratory failure with hypoxia: Secondary | ICD-10-CM | POA: Diagnosis not present

## 2021-06-30 DIAGNOSIS — L03116 Cellulitis of left lower limb: Secondary | ICD-10-CM | POA: Diagnosis not present

## 2021-06-30 DIAGNOSIS — E1140 Type 2 diabetes mellitus with diabetic neuropathy, unspecified: Secondary | ICD-10-CM | POA: Diagnosis not present

## 2021-06-30 DIAGNOSIS — Z794 Long term (current) use of insulin: Secondary | ICD-10-CM | POA: Diagnosis not present

## 2021-06-30 DIAGNOSIS — Z7984 Long term (current) use of oral hypoglycemic drugs: Secondary | ICD-10-CM | POA: Diagnosis not present

## 2021-06-30 DIAGNOSIS — T8131XA Disruption of external operation (surgical) wound, not elsewhere classified, initial encounter: Secondary | ICD-10-CM | POA: Diagnosis not present

## 2021-06-30 DIAGNOSIS — J449 Chronic obstructive pulmonary disease, unspecified: Secondary | ICD-10-CM | POA: Diagnosis not present

## 2021-06-30 DIAGNOSIS — N183 Chronic kidney disease, stage 3 unspecified: Secondary | ICD-10-CM | POA: Diagnosis not present

## 2021-06-30 DIAGNOSIS — E11621 Type 2 diabetes mellitus with foot ulcer: Secondary | ICD-10-CM | POA: Diagnosis not present

## 2021-06-30 DIAGNOSIS — I5032 Chronic diastolic (congestive) heart failure: Secondary | ICD-10-CM | POA: Diagnosis not present

## 2021-06-30 DIAGNOSIS — I130 Hypertensive heart and chronic kidney disease with heart failure and stage 1 through stage 4 chronic kidney disease, or unspecified chronic kidney disease: Secondary | ICD-10-CM | POA: Diagnosis not present

## 2021-06-30 LAB — GLUCOSE, CAPILLARY: Glucose-Capillary: 391 mg/dL — ABNORMAL HIGH (ref 70–99)

## 2021-06-30 NOTE — Progress Notes (Signed)
[name] ([id number]) , Visit Report for 06/30/2021 Chief Complaint Document Details Patient Name: Date of Service: [name], [name] 06/30/2021 3:00 PM Medical Record Number: [id number] Patient Account Number: [id number] Date of Birth/Sex: Treating RN: [name] (65 y.o. [name] Primary Care Provider: Carolee [name] Other Clinician: Referring Provider: Treating Provider/Extender: [name] in Treatment: 16 Information Obtained from: Patient Chief Complaint Nonhealing wound secondary to Wound dehiscence status post transmetatarsal amputation on the left Electronic Signature(s) Signed: 06/30/2021 5:24:56 PM By: Kalman Shan DO Entered By: Kalman Shan on 06/30/2021 17:16:44 -------------------------------------------------------------------------------- HPI Details Patient Name: Date of Service: [name]. 06/30/2021 3:00 PM Medical Record Number: [id number] Patient Account Number: [id number] Date of Birth/Sex: Treating RN: [name] (65 y.o. [name] Primary Care Provider: Carolee [name] Other Clinician: Referring Provider: Treating Provider/Extender: [name] in Treatment: 16 History of Present Illness HPI Description: Admission 8/18 [name] is a 65 year old male with a past medical history of uncontrolled insulin-dependent type 2 diabetes, COPD On O2 6 L via nasal cannula That presents to the clinic for a 31-month history of nonhealing wound to the left great toe amputation site. He has been following with podiatry for a wound to his left great toe that developed osteomyelitis and subsequently required amputation. He had the surgery done on 6/10. He had a partial first ray amputation by Dr. Posey Pronto. Since his surgery he has been placing iodine on the wound bed. He states that there has been no improvement to the wound healing in the past 2 months. He states he has occasional  intermittent pain to the area. He denies systemic signs of infection. 8/25; patient presents for 1 week follow-up. He states he has been using silver alginate daily without issues. He has no complaints today. He denies signs of infection. He is scheduled to have his MRI on 8/27. 9/15; patient presents for follow-up. He missed his last clinic appointment due to feeling ill. He continues to use silver alginate daily without any issues. He reports improvement in wound healing. He currently denies signs of infection. 9/27; patient presents for follow-up. He has developed a new wound to the second left toe. He is unaware of this and was noted on exam today. He continues to use silver alginate to the amputation site wound. He has no issues or complaints today. He currently denies signs of infection. 10/4; patient presents for follow-up. He is using silver alginate to the wound sites. He has no issues or complaints today. He denies systemic signs of infection. 10/10; patient presents for follow-up. He has been using silver alginate to the wound sites. He states that his foot became more swollen over the weekend, But has since improved. He overall feels okay. Admission 06/02/2021 Patient presents for nonhealing wound secondary to wound dehiscence following a transmetatarsal amputation. He states he has been using Betadine wet-to- dry dressings to the wound bed. He states the wound has been open for the past 3 weeks. He currently denies Pain or signs of infection. 11/21; patient presents for follow-up. He has home health but states this will end in 2 weeks per insurance. He has no issues or complaints today. He denies signs of infection. 11/28; patient presents for follow-up. He states that home health will be stopped at the end of the week. He has no issues or complaints today. 12/12; patient presents for follow-up. He states he saw Dr. Posey Pronto on 11/30 and was prescribed doxycycline for cellulitis. He  currently reports more erythema and pain over the past week. He states his sugars have been very high and in the 400s. His family encouraged him to go to the ED over the weekend however he declined. Electronic Signature(s) Signed: 06/30/2021 5:24:56 PM By: Kalman Shan DO Entered By: Kalman Shan on 06/30/2021 17:18:18 -------------------------------------------------------------------------------- Physical Exam Details Patient Name: Date of Service: [name], [name] 06/30/2021 3:00 PM Medical Record Number: [id number] Patient Account Number: [id number] Date of Birth/Sex: Treating RN: [name] (65 y.o. [name] Primary Care Provider: Carolee [name] Other Clinician: Referring Provider: Treating Provider/Extender: [name] in Treatment: 16 Constitutional respirations regular, non-labored and within target range for patient.. Cardiovascular 2+ dorsalis pedis/posterior tibialis pulses. Psychiatric pleasant and cooperative. Notes Left foot: Transmetatarsal amputation. Wound dehiscence at the surgical site. Nonviable tissue in the wound bed. Tunneling at the 9 o'clock position that probes to bone. No purulent drainage. Mild erythema to the foot and leg. Electronic Signature(s) Signed: 06/30/2021 5:24:56 PM By: Kalman Shan DO Entered By: Kalman Shan on 06/30/2021 17:19:02 -------------------------------------------------------------------------------- Physician Orders Details Patient Name: Date of Service: [name]. 06/30/2021 3:00 PM Medical Record Number: [id number] Patient Account Number: [id number] Date of Birth/Sex: Treating RN: [name] (65 y.o. [name] Primary Care Provider: Carolee [name] Other Clinician: Referring Provider: Treating Provider/Extender: [name] in Treatment: [phone] Verbal / Phone Orders: No Diagnosis Coding ICD-10 Coding Code Description [phone]  Non-pressure chronic ulcer of other part of left foot with other specified severity T81.31XD Disruption of external operation (surgical) wound, not elsewhere classified, subsequent encounter E11.621 Type 2 diabetes mellitus with foot ulcer J44.9 Chronic obstructive pulmonary disease, unspecified Follow-up Appointments ppointment in 1 week. - Dr. Heber [place] Return A ***Pick up pharmacy oral antibiotics today and start it today. **** ****STOP taking the doxycycline. *** Closely monitor blood glucose. Ensure to see endocrinologist for blood glucose control. Stop smoking. ***IF worsens go to the emergency department if redness and edema increases. *** Bathing/ Shower/ Hygiene May shower with protection but do not get wound dressing(s) wet. Edema Control - Lymphedema / SCD / Other Elevate legs to the level of the heart or above for 30 minutes daily and/or when sitting, a frequency of: - throughout the day Avoid standing for long periods of time. Moisturize legs daily. - both legs and feet every night before bed. Off-Loading DH Walker Boot to: - left foot per surgeon Additional Orders / Instructions Follow Nutritious Diet - Monitor and keep blood sugars well controlled Home Health No change in wound care orders this week; continue Home Health for wound care. May utilize formulary equivalent dressing for wound treatment orders unless otherwise specified. Other Home Health Orders/Instructions: - [facility] Wound Treatment Wound #7 - Amputation Site - Transmetatarsal Wound Laterality: Left Cleanser: Soap and Water [facility]) Every Other Day/30 Days Discharge Instructions: May shower and wash wound with dial antibacterial soap and water prior to dressing change. Cleanser: Wound Cleanser [facility]) Every Other Day/30 Days Discharge Instructions: Cleanse the wound with wound cleanser prior to applying a clean dressing using gauze sponges, not tissue or cotton balls. Prim Dressing:  Hydrofera Blue Classic Foam, 4x4 in [facility]) (Generic) Every Other Day/30 Days ary Discharge Instructions: Moisten with saline prior to applying to wound bed Secondary Dressing: Woven Gauze Sponge, Non-Sterile 4x4 in [facility]) Every Other Day/30 Days Discharge Instructions: Apply over primary dressing as directed. Secondary Dressing: ABD Pad, 5x9 [facility]) Every Other Day/30 Days Discharge Instructions: Apply  over primary dressing as directed. Secured With: Elastic Bandage 4 inch (ACE bandage) (Home Health) Every Other Day/30 Days Discharge Instructions: Secure with ACE bandage as directed. Secured With: [employer], 4.5x3.1 (in/yd) [facility]) Every Other Day/30 Days Discharge Instructions: Secure with Kerlix as directed. Patient Medications llergies: oxycodone, lisinopril A Notifications Medication Indication Start End 06/30/2021 clindamycin HCl DOSE 1 - oral 300 mg capsule - 1 capsule oral q6h x 7 days Electronic Signature(s) Signed: 06/30/2021 5:24:56 PM By: Kalman Shan DO Previous Signature: 06/30/2021 5:23:23 PM Version By: Kalman Shan DO Entered By: Kalman Shan on 06/30/2021 17:23:36 -------------------------------------------------------------------------------- Problem List Details Patient Name: Date of Service: [name]. 06/30/2021 3:00 PM Medical Record Number: [id number] Patient Account Number: [id number] Date of Birth/Sex: Treating RN: [name] (65 y.o. [name] Primary Care Provider: Carolee [name] Other Clinician: Referring Provider: Treating Provider/Extender: [name] in Treatment: 16 Active Problems ICD-10 Encounter Code Description Active Date MDM Diagnosis L97.528 Non-pressure chronic ulcer of other part of left foot with other specified 06/02/2021 No Yes severity T81.31XD Disruption of external operation (surgical) wound, not elsewhere classified, 06/02/2021 No  Yes subsequent encounter E11.621 Type 2 diabetes mellitus with foot ulcer 03/06/2021 No Yes J44.9 Chronic obstructive pulmonary disease, unspecified 03/06/2021 No Yes Inactive Problems ICD-10 Code Description Active Date Inactive Date S91.302A Unspecified open wound, left foot, initial encounter 03/06/2021 03/06/2021 Resolved Problems Electronic Signature(s) Signed: 06/30/2021 5:24:56 PM By: Kalman Shan DO Entered By: Kalman Shan on 06/30/2021 17:16:00 -------------------------------------------------------------------------------- Progress Note Details Patient Name: Date of Service: [name]. 06/30/2021 3:00 PM Medical Record Number: [id number] Patient Account Number: [id number] Date of Birth/Sex: Treating RN: [birth year] (65 y.o. [name] Primary Care Provider: Carolee [name] Other Clinician: Referring Provider: Treating Provider/Extender: [name] in Treatment: 16 Subjective Chief Complaint Information obtained from Patient Nonhealing wound secondary to Wound dehiscence status post transmetatarsal amputation on the left History of Present Illness (HPI) Admission 8/18 Mr. Spike Desilets is a 65 year old male with a past medical history of uncontrolled insulin-dependent type 2 diabetes, COPD On O2 6 L via nasal cannula That presents to the clinic for a 55-month history of nonhealing wound to the left great toe amputation site. He has been following with podiatry for a wound to his left great toe that developed osteomyelitis and subsequently required amputation. He had the surgery done on 6/10. He had a partial first ray amputation by Dr. Posey Pronto. Since his surgery he has been placing iodine on the wound bed. He states that there has been no improvement to the wound healing in the past 2 months. He states he has occasional intermittent pain to the area. He denies systemic signs of infection. 8/25; patient presents for 1 week follow-up. He  states he has been using silver alginate daily without issues. He has no complaints today. He denies signs of infection. He is scheduled to have his MRI on 8/27. 9/15; patient presents for follow-up. He missed his last clinic appointment due to feeling ill. He continues to use silver alginate daily without any issues. He reports improvement in wound healing. He currently denies signs of infection. 9/27; patient presents for follow-up. He has developed a new wound to the second left toe. He is unaware of this and was noted on exam today. He continues to use silver alginate to the amputation site wound. He has no issues or complaints today. He currently denies signs of infection. 10/4; patient presents for follow-up. He is using silver  alginate to the wound sites. He has no issues or complaints today. He denies systemic signs of infection. 10/10; patient presents for follow-up. He has been using silver alginate to the wound sites. He states that his foot became more swollen over the weekend, But has since improved. He overall feels okay. Admission 06/02/2021 Patient presents for nonhealing wound secondary to wound dehiscence following a transmetatarsal amputation. He states he has been using Betadine wet-to- dry dressings to the wound bed. He states the wound has been open for the past 3 weeks. He currently denies Pain or signs of infection. 11/21; patient presents for follow-up. He has home health but states this will end in 2 weeks per insurance. He has no issues or complaints today. He denies signs of infection. 11/28; patient presents for follow-up. He states that home health will be stopped at the end of the week. He has no issues or complaints today. 12/12; patient presents for follow-up. He states he saw Dr. Posey Pronto on 11/30 and was prescribed doxycycline for cellulitis. He currently reports more erythema and pain over the past week. He states his sugars have been very high and in the 400s. His  family encouraged him to go to the ED over the weekend however he declined. Patient History Information obtained from Patient. Family History Cancer - Maternal Grandparents,Paternal Grandparents,Father,Mother, Diabetes - Mother,Father, Heart Disease - Maternal Grandparents,Mother,Father, Kidney Disease - Maternal Grandparents, Lung Disease - Father, Stroke - Paternal Grandparents,Maternal Grandparents,Mother, No family history of Hereditary Spherocytosis, Hypertension, Seizures, Thyroid Problems, Tuberculosis. Social History Current every day smoker - 1/2 pack a day, Alcohol Use - Never, Drug Use - No History, Caffeine Use - Daily - coffee, tea. Medical History Respiratory Patient has history of Chronic Obstructive Pulmonary Disease (COPD), Sleep Apnea - does not use CPAP Cardiovascular Patient has history of Coronary Artery Disease - mx vessel, Hypertension Gastrointestinal Denies history of Cirrhosis , Colitis, Crohnoos, Hepatitis A, Hepatitis B, Hepatitis C Endocrine Patient has history of Type II Diabetes - last A1c - 13.3 Hospitalization/Surgery History - appendectomy. - cholecystectomy. - laproscopic nephrectomy. - lapro lysis of lesions. - thorascopy. - lung biopsy. - bronchoscopy. - (L) heart cath with angio X3. - endarcdectomy. - patch angioplasty. - left great toe amp. Kevin 10, 2022. Medical A Surgical History Notes nd Constitutional Symptoms (General Health) noncompliance , cocaine abuse ,hepatitis ,headaches Respiratory h/o pneumonia , end stage chronic lung disease , COPD/ILD Cardiovascular h/o CVA , hyperlipidemia , coronary atherosclerosis , acute on chronic diastolic heart failure LVEF 60%-65% Gastrointestinal cholecystitis , GERD Genitourinary h/o nephrolithiasis , h/o renal cell carcinoma , CKD Stage 3 Musculoskeletal arthritis Psychiatric anxiety , depression Objective Constitutional respirations regular, non-labored and within target range for  patient.. Vitals Time Taken: 3:30 PM, Temperature: 97.3 F, Pulse: 82 bpm, Respiratory Rate: 20 breaths/min, Blood Pressure: 94/61 mmHg, Capillary Blood Glucose: 391 mg/dl. General Notes: Blood glucose at 0600 today 400. Checked blood glucose in office 391 now. Provider made aware of vital signs. Cardiovascular 2+ dorsalis pedis/posterior tibialis pulses. Psychiatric pleasant and cooperative. General Notes: Left foot: Transmetatarsal amputation. Wound dehiscence at the surgical site. Nonviable tissue in the wound bed. Tunneling at the 9 o'clock position that probes to bone. No purulent drainage. Mild erythema to the foot and leg. Integumentary (Hair, Skin) Wound #7 status is Open. Original cause of wound was Surgical Injury. The date acquired was: 05/20/2021. The wound has been in treatment 4 weeks. The wound is located on the Left Amputation Site - Transmetatarsal. The wound  measures 1.1cm length x 5.5cm width x 1.3cm depth; 4.752cm^2 area and 6.177cm^3 volume. There is Fat Layer (Subcutaneous Tissue) exposed. There is no tunneling noted, however, there is undermining starting at 11:00 and ending at 1:00 with a maximum distance of 0.5cm. There is a large amount of purulent drainage noted. Foul odor after cleansing was noted. The wound margin is thickened. There is no granulation within the wound bed. There is a large (67-100%) amount of necrotic tissue within the wound bed including Adherent Slough. General Notes: maceration and redness to periwound. Assessment Active Problems ICD-10 Non-pressure chronic ulcer of other part of left foot with other specified severity Disruption of external operation (surgical) wound, not elsewhere classified, subsequent encounter Type 2 diabetes mellitus with foot ulcer Chronic obstructive pulmonary disease, unspecified Patient's wound is stable. There is slight increase in erythema to the foot and leg. I asked him to stop doxycycline and I will prescribe  clindamycin. Unfortunately patient's blood glucose levels are uncontrolled and he is a current smoker. These factors are a big deterrent for his wound healing. He is at high risk for amputation because of this. Follow-up in 1 week. Plan Follow-up Appointments: Return Appointment in 1 week. - Dr. Heber [place] ***Pick up pharmacy oral antibiotics today and start it today. **** ****STOP taking the doxycycline. *** Closely monitor blood glucose. Ensure to see endocrinologist for blood glucose control. Stop smoking. ***IF worsens go to the emergency department if redness and edema increases. *** Bathing/ Shower/ Hygiene: May shower with protection but do not get wound dressing(s) wet. Edema Control - Lymphedema / SCD / Other: Elevate legs to the level of the heart or above for 30 minutes daily and/or when sitting, a frequency of: - throughout the day Avoid standing for long periods of time. Moisturize legs daily. - both legs and feet every night before bed. Off-Loading: DH Walker Boot to: - left foot per surgeon Additional Orders / Instructions: Follow Nutritious Diet - Monitor and keep blood sugars well controlled Home Health: No change in wound care orders this week; continue Home Health for wound care. May utilize formulary equivalent dressing for wound treatment orders unless otherwise specified. Other Home Health Orders/Instructions: - [facility] The following medication(s) was prescribed: clindamycin HCl oral 300 mg capsule 1 1 capsule oral q6h x 7 days starting 06/30/2021 WOUND #7: - Amputation Site - Transmetatarsal Wound Laterality: Left Cleanser: Soap and Water [facility]) Every Other Day/30 Days Discharge Instructions: May shower and wash wound with dial antibacterial soap and water prior to dressing change. Cleanser: Wound Cleanser [facility]) Every Other Day/30 Days Discharge Instructions: Cleanse the wound with wound cleanser prior to applying a clean dressing using gauze  sponges, not tissue or cotton balls. Prim Dressing: Hydrofera Blue Classic Foam, 4x4 in [facility]) (Generic) Every Other Day/30 Days ary Discharge Instructions: Moisten with saline prior to applying to wound bed Secondary Dressing: Woven Gauze Sponge, Non-Sterile 4x4 in [name]) Every Other Day/30 Days Discharge Instructions: Apply over primary dressing as directed. Secondary Dressing: ABD Pad, 5x9 [facility]) Every Other Day/30 Days Discharge Instructions: Apply over primary dressing as directed. Secured With: Elastic Bandage 4 inch (ACE bandage) (Home Health) Every Other Day/30 Days Discharge Instructions: Secure with ACE bandage as directed. Secured With: [employer], 4.5x3.1 (in/yd) [facility]) Every Other Day/30 Days Discharge Instructions: Secure with Kerlix as directed. 1. Hydrofera Blue 2. Clindamycin 3. Follow-up in 1 week Electronic Signature(s) Signed: 06/30/2021 5:24:56 PM By: Kalman Shan DO Entered By:  Kalman Shan on 06/30/2021 17:23:51 -------------------------------------------------------------------------------- HxROS Details Patient Name: Date of Service: [name], [name] 06/30/2021 3:00 PM Medical Record Number: [id number] Patient Account Number: [id number] Date of Birth/Sex: Treating RN: [birth year][name] (65 y.o. [name] Primary Care Provider: Carolee [name] Other Clinician: Referring Provider: Treating Provider/Extender: [name] in Treatment: 16 Information Obtained From Patient Constitutional Symptoms (General Health) Medical History: Past Medical History Notes: noncompliance , cocaine abuse ,hepatitis ,headaches Respiratory Medical History: Positive for: Chronic Obstructive Pulmonary Disease (COPD); Sleep Apnea - does not use CPAP Past Medical History Notes: h/o pneumonia , end stage chronic lung disease , COPD/ILD Cardiovascular Medical History: Positive for: Coronary Artery Disease -  mx vessel; Hypertension Past Medical History Notes: h/o CVA , hyperlipidemia , coronary atherosclerosis , acute on chronic diastolic heart failure LVEF 60%-65% Gastrointestinal Medical History: Negative for: Cirrhosis ; Colitis; Crohns; Hepatitis A; Hepatitis B; Hepatitis C Past Medical History Notes: cholecystitis , GERD Endocrine Medical History: Positive for: Type II Diabetes - last A1c - 13.3 Genitourinary Medical History: Past Medical History Notes: h/o nephrolithiasis , h/o renal cell carcinoma , CKD Stage 3 Musculoskeletal Medical History: Past Medical History Notes: arthritis Psychiatric Medical History: Past Medical History Notes: anxiety , depression Immunizations Pneumococcal Vaccine: Received Pneumococcal Vaccination: Yes Received Pneumococcal Vaccination On or After 60th Birthday: Yes Implantable Devices None Hospitalization / Surgery History Type of Hospitalization/Surgery appendectomy cholecystectomy laproscopic nephrectomy lapro lysis of lesions thorascopy lung biopsy bronchoscopy (L) heart cath with angio X3 endarcdectomy patch angioplasty left great toe amp. Kevin 10, 2022 Family and Social History Cancer: Yes - Maternal Grandparents,Paternal Grandparents,Father,Mother; Diabetes: Yes - Mother,Father; Heart Disease: Yes - Maternal Grandparents,Mother,Father; Hereditary Spherocytosis: No; Hypertension: No; Kidney Disease: Yes - Maternal Grandparents; Lung Disease: Yes - Father; Seizures: No; Stroke: Yes - Paternal Grandparents,Maternal Grandparents,Mother; Thyroid Problems: No; Tuberculosis: No; Current every day smoker - 1/2 pack a day; Alcohol Use: Never; Drug Use: No History; Caffeine Use: Daily - coffee, tea; Financial Concerns: No; Food, Clothing or Shelter Needs: No; Support System Lacking: No; Transportation Concerns: No Electronic Signature(s) Signed: 06/30/2021 5:24:56 PM By: Kalman Shan DO Signed: 06/30/2021 5:29:47 PM By: Lorrin Jackson Entered By: Kalman Shan on 06/30/2021 17:18:25 -------------------------------------------------------------------------------- SuperBill Details Patient Name: Date of Service: [name]. 06/30/2021 Medical Record Number: [id number] Patient Account Number: [id number] Date of Birth/Sex: Treating RN: [birth year] (65 y.o. [name], [name] Primary Care Provider: Carolee [name] Other Clinician: Referring Provider: Treating Provider/Extender: [name] in Treatment: 16 Diagnosis Coding ICD-10 Codes Code Description [phone] Non-pressure chronic ulcer of other part of left foot with other specified severity T81.31XD Disruption of external operation (surgical) wound, not elsewhere classified, subsequent encounter E11.621 Type 2 diabetes mellitus with foot ulcer J44.9 Chronic obstructive pulmonary disease, unspecified L03.116 Cellulitis of left lower limb Facility Procedures CPT4 Code: [id number] Description: [id number] - WOUND CARE VISIT-LEV 4 EST PT Modifier: Quantity: 1 Physician Procedures : CPT4 Code Description Modifier [id number] - WC PHYS LEVEL 4 - EST PT ICD-10 Diagnosis Description L03.116 Cellulitis of left lower limb L97.528 Non-pressure chronic ulcer of other part of left foot with other specified severity T81.31XD Disruption  of external operation (surgical) wound, not elsewhere classified, subsequent encounter E11.621 Type 2 diabetes mellitus with foot ulcer Quantity: 1 Electronic Signature(s) Signed: 06/30/2021 5:24:56 PM By: Kalman Shan DO Entered By: Kalman Shan on 06/30/2021 17:24:37

## 2021-06-30 NOTE — Progress Notes (Signed)
[name] ([id number]) , Visit Report for 06/30/2021 Arrival Information Details Patient Name: Date of Service: [name], [name] 06/30/2021 3:00 PM Medical Record Number: [id number] Patient Account Number: [id number] Date of Birth/Sex: Treating RN: 03/23/1956 (65 y.o. [name], [name] Primary Care Tomeshia Pizzi: Carolee Rota Other Clinician: Referring Monya Kozakiewicz: Treating Brandy Zuba/Extender: Maryagnes Amos in Treatment: 16 Visit Information History Since Last Visit Added or deleted any medications: No Patient Arrived: Wheel Chair Any new allergies or adverse reactions: No Arrival Time: 15:30 Had a fall or experienced change in No Accompanied By: Niece in law activities of daily living that may affect Transfer Assistance: Manual risk of falls: Patient Identification Verified: Yes Signs or symptoms of abuse/neglect since last No Secondary Verification Process Completed: Yes visito Patient Requires Transmission-Based Precautions: No Hospitalized since last visit: No Patient Has Alerts: Yes Implantable device outside of the clinic No Patient Alerts: ABI's: R: 0.82 L:0.76 excluding cellular tissue based products placed in the center since last visit: Has Dressing in Place as Prescribed: Yes Has Footwear/Offloading in Place as Yes Prescribed: Left: Removable Cast Walker/Walking Boot Pain Present Now: Yes Notes Per patient pain and swelling to left foot and leg noted over weekend. Per niece patient's blood glucose Friday at 0600 BG 700 and dropped to 150 within 4 hours. Per niece patient seen PCP today, PCP ordered blood work, urologist referral related to Prostate, and Endocrinologist for elevated BG. Per patient and niece PCP is aware of feeling bad today. Per patient feeling forgetful. Dolan Xia made aware. Electronic Signature(s) Signed: 06/30/2021 6:04:36 PM By: Deon Pilling RN, BSN Entered By: Deon Pilling on 06/30/2021  15:44:18 -------------------------------------------------------------------------------- Clinic Level of Care Assessment Details Patient Name: Date of Service: [name], [name] 06/30/2021 3:00 PM Medical Record Number: [id number] Patient Account Number: [id number] Date of Birth/Sex: Treating RN: Jan 09, 1956 (65 y.o. [name], [name] Primary Care Jessaca Philippi: Carolee Rota Other Clinician: Referring Karalee Hauter: Treating Vestal Crandall/Extender: Maryagnes Amos in Treatment: 16 Clinic Level of Care Assessment Items TOOL 4 Quantity Score X- 1 0 Use when only an EandM is performed on FOLLOW-UP visit ASSESSMENTS - Nursing Assessment / Reassessment X- 1 10 Reassessment of Co-morbidities (includes updates in patient status) X- 1 5 Reassessment of Adherence to Treatment Plan ASSESSMENTS - Wound and Skin Assessment / Reassessment X- 1 5 Simple Wound Assessment / Reassessment - one wound []  - 0 Complex Wound Assessment / Reassessment - multiple wounds X- 1 10 Dermatologic / Skin Assessment (not related to wound area) ASSESSMENTS - Focused Assessment X- 1 5 Circumferential Edema Measurements - multi extremities X- 1 10 Nutritional Assessment / Counseling / Intervention []  - 0 Lower Extremity Assessment (monofilament, tuning fork, pulses) []  - 0 Peripheral Arterial Disease Assessment (using hand held doppler) ASSESSMENTS - Ostomy and/or Continence Assessment and Care []  - 0 Incontinence Assessment and Management []  - 0 Ostomy Care Assessment and Management (repouching, etc.) PROCESS - Coordination of Care []  - 0 Simple Patient / Family Education for ongoing care X- 1 20 Complex (extensive) Patient / Family Education for ongoing care X- 1 10 Staff obtains Programmer, systems, Records, T Results / Process Orders est X- 1 10 Staff telephones HHA, Nursing Homes / Clarify orders / etc []  - 0 Routine Transfer to another Facility (non-emergent condition) []  - 0 Routine Hospital  Admission (non-emergent condition) []  - 0 New Admissions / Biomedical engineer / Ordering NPWT Apligraf, etc. , []  - 0 Emergency Hospital Admission (emergent condition) []  - 0 Simple Discharge Coordination X- 1 15  Complex (extensive) Discharge Coordination PROCESS - Special Needs []  - 0 Pediatric / Minor Patient Management []  - 0 Isolation Patient Management []  - 0 Hearing / Language / Visual special needs []  - 0 Assessment of Community assistance (transportation, D/C planning, etc.) []  - 0 Additional assistance / Altered mentation []  - 0 Support Surface(s) Assessment (bed, cushion, seat, etc.) INTERVENTIONS - Wound Cleansing / Measurement []  - 0 Simple Wound Cleansing - one wound X- 1 5 Complex Wound Cleansing - multiple wounds X- 1 5 Wound Imaging (photographs - any number of wounds) []  - 0 Wound Tracing (instead of photographs) []  - 0 Simple Wound Measurement - one wound X- 1 5 Complex Wound Measurement - multiple wounds INTERVENTIONS - Wound Dressings []  - 0 Small Wound Dressing one or multiple wounds X- 1 15 Medium Wound Dressing one or multiple wounds []  - 0 Large Wound Dressing one or multiple wounds []  - 0 Application of Medications - topical []  - 0 Application of Medications - injection INTERVENTIONS - Miscellaneous []  - 0 External ear exam []  - 0 Specimen Collection (cultures, biopsies, blood, body fluids, etc.) []  - 0 Specimen(s) / Culture(s) sent or taken to Lab for analysis []  - 0 Patient Transfer (multiple staff / Civil Service fast streamer / Similar devices) []  - 0 Simple Staple / Suture removal (25 or less) []  - 0 Complex Staple / Suture removal (26 or more) []  - 0 Hypo / Hyperglycemic Management (close monitor of Blood Glucose) []  - 0 Ankle / Brachial Index (ABI) - do not check if billed separately X- 1 5 Vital Signs Has the patient been seen at the hospital within the last three years: Yes Total Score: 135 Level Of Care: New/Established -  Level 4 Electronic Signature(s) Signed: 06/30/2021 6:04:36 PM By: Deon Pilling RN, BSN Entered By: Deon Pilling on 06/30/2021 16:21:35 -------------------------------------------------------------------------------- Encounter Discharge Information Details Patient Name: Date of Service: [name]. 06/30/2021 3:00 PM Medical Record Number: [id number] Patient Account Number: [id number] Date of Birth/Sex: Treating RN: 1956/03/27 (65 y.o. [name] Primary Care Marcayla Budge: Carolee Rota Other Clinician: Referring Cecilio Ohlrich: Treating Dula Havlik/Extender: Maryagnes Amos in Treatment: 16 Encounter Discharge Information Items Discharge Condition: Stable Ambulatory Status: Wheelchair Discharge Destination: Home Transportation: Private Auto Accompanied By: niece in law Schedule Follow-up Appointment: Yes Clinical Summary of Care: Electronic Signature(s) Signed: 06/30/2021 6:04:36 PM By: Deon Pilling RN, BSN Entered By: Deon Pilling on 06/30/2021 16:22:35 -------------------------------------------------------------------------------- Lower Extremity Assessment Details Patient Name: Date of Service: [name]. 06/30/2021 3:00 PM Medical Record Number: [id number] Patient Account Number: [id number] Date of Birth/Sex: Treating RN: Aug 26, 1955 (65 y.o. [name] Primary Care Abem Shaddix: Carolee Rota Other Clinician: Referring Oather Muilenburg: Treating Aldeen Riga/Extender: Maryagnes Amos in Treatment: 16 Edema Assessment Assessed: [name]: Yes] [name]: No] Edema: [Left: Ye] [Right: s] Calf Left: Right: Point of Measurement: 37 cm From Medial Instep 42 cm Ankle Left: Right: Point of Measurement: 8 cm From Medial Instep 27 cm Vascular Assessment Pulses: Dorsalis Pedis Palpable: [Left:Yes] Notes Left foot and leg red, warm to touch, and edematous. Electronic Signature(s) Signed: 06/30/2021 6:04:36 PM By: Deon Pilling RN,  BSN Entered By: Deon Pilling on 06/30/2021 15:46:19 -------------------------------------------------------------------------------- Multi Wound Chart Details Patient Name: Date of Service: [name]. 06/30/2021 3:00 PM Medical Record Number: [id number] Patient Account Number: [id number] Date of Birth/Sex: Treating RN: Aug 06, 1955 (65 y.o. [name] Primary Care Jacqualine Weichel: Carolee Rota Other Clinician: Referring Akram Kissick: Treating Jaylaa Gallion/Extender: Maryagnes Amos in Treatment: 16 Vital  Signs Height(in): Capillary Blood Glucose(mg/dl): 391 Weight(lbs): Pulse(bpm): 82 Body Mass Index(BMI): Blood Pressure(mmHg): 94/61 Temperature(F): 97.3 Respiratory Rate(breaths/min): 20 Photos: [N/A:N/A] Left Amputation Site - Transmetatarsal N/A N/A Wound Location: Surgical Injury N/A N/A Wounding Event: Dehisced Wound N/A N/A Primary Etiology: Chronic Obstructive Pulmonary N/A N/A Comorbid History: Disease (COPD), Sleep Apnea, Coronary Artery Disease, Hypertension, Type II Diabetes 05/20/2021 N/A N/A Date Acquired: 4 N/A N/A Weeks of Treatment: Open N/A N/A Wound Status: 1.1x5.5x1.3 N/A N/A Measurements L x W x D (cm) 4.752 N/A N/A A (cm) : rea 6.177 N/A N/A Volume (cm) : 29.60% N/A N/A % Reduction in A rea: -128.60% N/A N/A % Reduction in Volume: 11 Starting Position 1 (o'clock): 1 Ending Position 1 (o'clock): 0.5 Maximum Distance 1 (cm): Yes N/A N/A Undermining: Full Thickness Without Exposed N/A N/A Classification: Support Structures Large N/A N/A Exudate Amount: Purulent N/A N/A Exudate Type: yellow, brown, green N/A N/A Exudate Color: Yes N/A N/A Foul Odor A Cleansing: fter No N/A N/A Odor Anticipated Due to Product Use: Thickened N/A N/A Wound Margin: None Present (0%) N/A N/A Granulation A mount: Large (67-100%) N/A N/A Necrotic Amount: Fat Layer (Subcutaneous Tissue): Yes N/A N/A Exposed  Structures: Fascia: No Tendon: No Muscle: No Joint: No Bone: No None N/A N/A Epithelialization: maceration and redness to periwound. N/A N/A Assessment Notes: Treatment Notes Wound #7 (Amputation Site - Transmetatarsal) Wound Laterality: Left Cleanser Soap and Water Discharge Instruction: May shower and wash wound with dial antibacterial soap and water prior to dressing change. Wound Cleanser Discharge Instruction: Cleanse the wound with wound cleanser prior to applying a clean dressing using gauze sponges, not tissue or cotton balls. Peri-Wound Care Topical Primary Dressing Hydrofera Blue Classic Foam, 4x4 in Discharge Instruction: Moisten with saline prior to applying to wound bed Secondary Dressing Woven Gauze Sponge, Non-Sterile 4x4 in Discharge Instruction: Apply over primary dressing as directed. ABD Pad, 5x9 Discharge Instruction: Apply over primary dressing as directed. Secured With Elastic Bandage 4 inch (ACE bandage) Discharge Instruction: Secure with ACE bandage as directed. Kerlix Roll Sterile, 4.5x3.1 (in/yd) Discharge Instruction: Secure with Kerlix as directed. Compression Wrap Compression Stockings Add-Ons Electronic Signature(s) Signed: 06/30/2021 5:24:56 PM By: Kalman Shan DO Signed: 06/30/2021 5:29:47 PM By: Lorrin Jackson Entered By: Kalman Shan on 06/30/2021 17:16:31 -------------------------------------------------------------------------------- Multi-Disciplinary Care Plan Details Patient Name: Date of Service: [name]. 06/30/2021 3:00 PM Medical Record Number: [id number] Patient Account Number: [id number] Date of Birth/Sex: Treating RN: Sep 02, 1955 (65 y.o. [name] Primary Care Michaeal Davis: Carolee Rota Other Clinician: Referring Chonda Baney: Treating Ken Bonn/Extender: Maryagnes Amos in Treatment: [place] reviewed with physician Active Inactive Nutrition Nursing  Diagnoses: Impaired glucose control: actual or potential Goals: Patient/caregiver verbalizes understanding of need to maintain therapeutic glucose control per primary care physician Date Initiated: 06/30/2021 Target Resolution Date: 07/25/2021 Goal Status: Active Patient/caregiver will maintain therapeutic glucose control Date Initiated: 06/30/2021 Target Resolution Date: 07/25/2021 Goal Status: Active Interventions: Assess HgA1c results as ordered upon admission and as needed Provide education on elevated blood sugars and impact on wound healing Provide education on nutrition Treatment Activities: Obtain HgA1c : 06/30/2021 Patient referred to Primary Care Physician for further nutritional evaluation : 06/30/2021 Notes: Wound/Skin Impairment Nursing Diagnoses: Impaired tissue integrity Knowledge deficit related to ulceration/compromised skin integrity Goals: Patient will have a decrease in wound volume by X% from date: (specify in notes) Date Initiated: 03/06/2021 Date Inactivated: 04/15/2021 Target Resolution Date: 03/26/2021 Goal Status: Met Patient/caregiver will verbalize understanding of skin care regimen  Date Initiated: 03/06/2021 Target Resolution Date: 06/30/2021 Goal Status: Active Ulcer/skin breakdown will have a volume reduction of 30% by week 4 Date Initiated: 03/06/2021 Date Inactivated: 04/15/2021 Target Resolution Date: 03/26/2021 Goal Status: Unmet Unmet Reason: probable osteomyelitis Ulcer/skin breakdown will have a volume reduction of 50% by week 8 Date Initiated: 04/15/2021 Date Inactivated: 04/21/2021 Target Resolution Date: 04/22/2021 Goal Status: Met Ulcer/skin breakdown will have a volume reduction of 80% by week 12 Date Initiated: 04/21/2021 Target Resolution Date: 06/30/2021 Goal Status: Active Interventions: Assess patient/caregiver ability to obtain necessary supplies Assess patient/caregiver ability to perform ulcer/skin care regimen upon admission and as  needed Assess ulceration(s) every visit Notes: 06/02/21: Patient had transmet 10/27. Incision dehisced, wound care ongoing. Electronic Signature(s) Signed: 06/30/2021 6:04:36 PM By: Deon Pilling RN, BSN Entered By: Deon Pilling on 06/30/2021 16:16:28 -------------------------------------------------------------------------------- Pain Assessment Details Patient Name: Date of Service: [name]. 06/30/2021 3:00 PM Medical Record Number: [id number] Patient Account Number: [id number] Date of Birth/Sex: Treating RN: May 26, 1956 (65 y.o. [name] Primary Care Zarai Orsborn: Carolee Rota Other Clinician: Referring Ashly Goethe: Treating Quinlan Mcfall/Extender: Maryagnes Amos in Treatment: 16 Active Problems Location of Pain Severity and Description of Pain Patient Has Paino Yes Site Locations Pain Location: Pain in Ulcers Rate the pain. Current Pain Level: 8 Worst Pain Level: 10 Least Pain Level: 0 Tolerable Pain Level: 8 Pain Management and Medication Current Pain Management: Medication: Yes Cold Application: No Rest: Yes Massage: No Activity: No T.E.N.S.: No Heat Application: No Leg drop or elevation: No Is the Current Pain Management Adequate: Adequate How does your wound impact your activities of daily livingo Sleep: Yes Bathing: No Appetite: No Relationship With Others: No Bladder Continence: No Emotions: No Bowel Continence: No Work: No Toileting: No Drive: No Dressing: No Hobbies: No Engineer, maintenance) Signed: 06/30/2021 6:04:36 PM By: Deon Pilling RN, BSN Entered By: Deon Pilling on 06/30/2021 15:45:45 -------------------------------------------------------------------------------- Patient/Caregiver Education Details Patient Name: Date of Service: [name], Kevin W. 12/12/2022andnbsp3:00 PM Medical Record Number: [id number] Patient Account Number: [id number] Date of Birth/Gender: Treating RN: 06/02/1956 (65 y.o. [name] Primary Care Physician: Carolee Rota Other Clinician: Referring Physician: Treating Physician/Extender: Maryagnes Amos in Treatment: 16 Education Assessment Education Provided To: Patient and Caregiver niece Education Topics Provided Nutrition: Handouts: Elevated Blood Sugars: How Do They Affect Wound Healing Methods: Explain/Verbal Responses: Reinforcements needed Electronic Signature(s) Signed: 06/30/2021 6:04:36 PM By: Deon Pilling RN, BSN Entered By: Deon Pilling on 06/30/2021 16:16:48 -------------------------------------------------------------------------------- Wound Assessment Details Patient Name: Date of Service: [name]. 06/30/2021 3:00 PM Medical Record Number: [id number] Patient Account Number: [id number] Date of Birth/Sex: Treating RN: [birth year] (65 y.o. [name], [name] Primary Care Kamoria Lucien: Carolee Rota Other Clinician: Referring Ashley Montminy: Treating Denym Rahimi/Extender: Maryagnes Amos in Treatment: 16 Wound Status Wound Number: 7 Primary Dehisced Wound Etiology: Wound Location: Left Amputation Site - Transmetatarsal Wound Open Wounding Event: Surgical Injury Status: Date Acquired: 05/20/2021 Comorbid Chronic Obstructive Pulmonary Disease (COPD), Sleep Apnea, Weeks Of Treatment: 4 History: Coronary Artery Disease, Hypertension, Type II Diabetes Clustered Wound: No Photos Wound Measurements Length: (cm) 1.1 Width: (cm) 5.5 Depth: (cm) 1.3 Area: (cm) 4.752 Volume: (cm) 6.177 % Reduction in Area: 29.6% % Reduction in Volume: -128.6% Epithelialization: None Tunneling: No Undermining: Yes Starting Position (o'clock): 11 Ending Position (o'clock): 1 Maximum Distance: (cm) 0.5 Wound Description Classification: Full Thickness Without Exposed Support Structures Wound Margin: Thickened Exudate Amount: Large Exudate Type: Purulent Exudate Color: yellow, brown, green Foul  Odor After Cleansing:  Yes Due to Product Use: No Slough/Fibrino Yes Wound Bed Granulation Amount: None Present (0%) Exposed Structure Necrotic Amount: Large (67-100%) Fascia Exposed: No Necrotic Quality: Adherent Slough Fat Layer (Subcutaneous Tissue) Exposed: Yes Tendon Exposed: No Muscle Exposed: No Joint Exposed: No Bone Exposed: No Assessment Notes maceration and redness to periwound. Treatment Notes Wound #7 (Amputation Site - Transmetatarsal) Wound Laterality: Left Cleanser Soap and Water Discharge Instruction: May shower and wash wound with dial antibacterial soap and water prior to dressing change. Wound Cleanser Discharge Instruction: Cleanse the wound with wound cleanser prior to applying a clean dressing using gauze sponges, not tissue or cotton balls. Peri-Wound Care Topical Primary Dressing Hydrofera Blue Classic Foam, 4x4 in Discharge Instruction: Moisten with saline prior to applying to wound bed Secondary Dressing Woven Gauze Sponge, Non-Sterile 4x4 in Discharge Instruction: Apply over primary dressing as directed. ABD Pad, 5x9 Discharge Instruction: Apply over primary dressing as directed. Secured With Elastic Bandage 4 inch (ACE bandage) Discharge Instruction: Secure with ACE bandage as directed. Kerlix Roll Sterile, 4.5x3.1 (in/yd) Discharge Instruction: Secure with Kerlix as directed. Compression Wrap Compression Stockings Add-Ons Electronic Signature(s) Signed: 06/30/2021 4:52:58 PM By: Valeria Batman EMT Signed: 06/30/2021 6:04:36 PM By: Deon Pilling RN, BSN Entered By: Valeria Batman on 06/30/2021 16:52:58 -------------------------------------------------------------------------------- Vitals Details Patient Name: Date of Service: [name]. 06/30/2021 3:00 PM Medical Record Number: [id number] Patient Account Number: [id number] Date of Birth/Sex: Treating RN: February 05, 1956 (65 y.o. [name], [name] Primary Care Jae Bruck: Carolee Rota Other Clinician: Referring Alejandra Barna: Treating Vimal Derego/Extender: Maryagnes Amos in Treatment: 16 Vital Signs Time Taken: 15:30 Temperature (F): 97.3 Pulse (bpm): 82 Respiratory Rate (breaths/min): 20 Blood Pressure (mmHg): 94/61 Capillary Blood Glucose (mg/dl): 391 Reference Range: 80 - 120 mg / dl Notes Blood glucose at 0600 today 400. Checked blood glucose in office 391 now. Shedrick Sarli made aware of vital signs. Electronic Signature(s) Signed: 06/30/2021 6:04:36 PM By: Deon Pilling RN, BSN Entered By: Deon Pilling on 06/30/2021 15:45:21

## 2021-07-03 DIAGNOSIS — G4730 Sleep apnea, unspecified: Secondary | ICD-10-CM | POA: Diagnosis not present

## 2021-07-03 DIAGNOSIS — I10 Essential (primary) hypertension: Secondary | ICD-10-CM | POA: Diagnosis not present

## 2021-07-03 DIAGNOSIS — I2510 Atherosclerotic heart disease of native coronary artery without angina pectoris: Secondary | ICD-10-CM | POA: Diagnosis not present

## 2021-07-03 DIAGNOSIS — E1169 Type 2 diabetes mellitus with other specified complication: Secondary | ICD-10-CM | POA: Diagnosis not present

## 2021-07-03 DIAGNOSIS — M86172 Other acute osteomyelitis, left ankle and foot: Secondary | ICD-10-CM | POA: Diagnosis not present

## 2021-07-03 DIAGNOSIS — J449 Chronic obstructive pulmonary disease, unspecified: Secondary | ICD-10-CM | POA: Diagnosis not present

## 2021-07-03 DIAGNOSIS — Z79891 Long term (current) use of opiate analgesic: Secondary | ICD-10-CM | POA: Diagnosis not present

## 2021-07-03 DIAGNOSIS — K219 Gastro-esophageal reflux disease without esophagitis: Secondary | ICD-10-CM | POA: Diagnosis not present

## 2021-07-03 DIAGNOSIS — E1142 Type 2 diabetes mellitus with diabetic polyneuropathy: Secondary | ICD-10-CM | POA: Diagnosis not present

## 2021-07-03 DIAGNOSIS — M1990 Unspecified osteoarthritis, unspecified site: Secondary | ICD-10-CM | POA: Diagnosis not present

## 2021-07-03 DIAGNOSIS — Z9181 History of falling: Secondary | ICD-10-CM | POA: Diagnosis not present

## 2021-07-03 DIAGNOSIS — Z4781 Encounter for orthopedic aftercare following surgical amputation: Secondary | ICD-10-CM | POA: Diagnosis not present

## 2021-07-03 DIAGNOSIS — Z7902 Long term (current) use of antithrombotics/antiplatelets: Secondary | ICD-10-CM | POA: Diagnosis not present

## 2021-07-03 DIAGNOSIS — Z7984 Long term (current) use of oral hypoglycemic drugs: Secondary | ICD-10-CM | POA: Diagnosis not present

## 2021-07-03 DIAGNOSIS — Z4801 Encounter for change or removal of surgical wound dressing: Secondary | ICD-10-CM | POA: Diagnosis not present

## 2021-07-03 DIAGNOSIS — F17210 Nicotine dependence, cigarettes, uncomplicated: Secondary | ICD-10-CM | POA: Diagnosis not present

## 2021-07-03 DIAGNOSIS — C641 Malignant neoplasm of right kidney, except renal pelvis: Secondary | ICD-10-CM | POA: Diagnosis not present

## 2021-07-03 DIAGNOSIS — F32A Depression, unspecified: Secondary | ICD-10-CM | POA: Diagnosis not present

## 2021-07-03 DIAGNOSIS — Z91199 Patient's noncompliance with other medical treatment and regimen due to unspecified reason: Secondary | ICD-10-CM | POA: Diagnosis not present

## 2021-07-03 DIAGNOSIS — Z89432 Acquired absence of left foot: Secondary | ICD-10-CM | POA: Diagnosis not present

## 2021-07-03 DIAGNOSIS — Z905 Acquired absence of kidney: Secondary | ICD-10-CM | POA: Diagnosis not present

## 2021-07-04 DIAGNOSIS — E1142 Type 2 diabetes mellitus with diabetic polyneuropathy: Secondary | ICD-10-CM | POA: Diagnosis not present

## 2021-07-04 DIAGNOSIS — K219 Gastro-esophageal reflux disease without esophagitis: Secondary | ICD-10-CM | POA: Diagnosis not present

## 2021-07-04 DIAGNOSIS — Z4801 Encounter for change or removal of surgical wound dressing: Secondary | ICD-10-CM | POA: Diagnosis not present

## 2021-07-04 DIAGNOSIS — C641 Malignant neoplasm of right kidney, except renal pelvis: Secondary | ICD-10-CM | POA: Diagnosis not present

## 2021-07-04 DIAGNOSIS — Z905 Acquired absence of kidney: Secondary | ICD-10-CM | POA: Diagnosis not present

## 2021-07-04 DIAGNOSIS — E1169 Type 2 diabetes mellitus with other specified complication: Secondary | ICD-10-CM | POA: Diagnosis not present

## 2021-07-04 DIAGNOSIS — Z4781 Encounter for orthopedic aftercare following surgical amputation: Secondary | ICD-10-CM | POA: Diagnosis not present

## 2021-07-04 DIAGNOSIS — Z9181 History of falling: Secondary | ICD-10-CM | POA: Diagnosis not present

## 2021-07-04 DIAGNOSIS — F17210 Nicotine dependence, cigarettes, uncomplicated: Secondary | ICD-10-CM | POA: Diagnosis not present

## 2021-07-04 DIAGNOSIS — Z79891 Long term (current) use of opiate analgesic: Secondary | ICD-10-CM | POA: Diagnosis not present

## 2021-07-04 DIAGNOSIS — Z89432 Acquired absence of left foot: Secondary | ICD-10-CM | POA: Diagnosis not present

## 2021-07-04 DIAGNOSIS — I2510 Atherosclerotic heart disease of native coronary artery without angina pectoris: Secondary | ICD-10-CM | POA: Diagnosis not present

## 2021-07-04 DIAGNOSIS — Z7902 Long term (current) use of antithrombotics/antiplatelets: Secondary | ICD-10-CM | POA: Diagnosis not present

## 2021-07-04 DIAGNOSIS — F32A Depression, unspecified: Secondary | ICD-10-CM | POA: Diagnosis not present

## 2021-07-04 DIAGNOSIS — M86172 Other acute osteomyelitis, left ankle and foot: Secondary | ICD-10-CM | POA: Diagnosis not present

## 2021-07-04 DIAGNOSIS — G4730 Sleep apnea, unspecified: Secondary | ICD-10-CM | POA: Diagnosis not present

## 2021-07-04 DIAGNOSIS — J449 Chronic obstructive pulmonary disease, unspecified: Secondary | ICD-10-CM | POA: Diagnosis not present

## 2021-07-04 DIAGNOSIS — Z91199 Patient's noncompliance with other medical treatment and regimen due to unspecified reason: Secondary | ICD-10-CM | POA: Diagnosis not present

## 2021-07-04 DIAGNOSIS — Z7984 Long term (current) use of oral hypoglycemic drugs: Secondary | ICD-10-CM | POA: Diagnosis not present

## 2021-07-04 DIAGNOSIS — M1990 Unspecified osteoarthritis, unspecified site: Secondary | ICD-10-CM | POA: Diagnosis not present

## 2021-07-04 DIAGNOSIS — I10 Essential (primary) hypertension: Secondary | ICD-10-CM | POA: Diagnosis not present

## 2021-07-07 ENCOUNTER — Encounter (HOSPITAL_BASED_OUTPATIENT_CLINIC_OR_DEPARTMENT_OTHER): Payer: Medicare Other | Admitting: Internal Medicine

## 2021-07-09 ENCOUNTER — Other Ambulatory Visit: Payer: Self-pay

## 2021-07-09 ENCOUNTER — Ambulatory Visit (INDEPENDENT_AMBULATORY_CARE_PROVIDER_SITE_OTHER): Payer: Medicare Other | Admitting: Podiatry

## 2021-07-09 DIAGNOSIS — M869 Osteomyelitis, unspecified: Secondary | ICD-10-CM

## 2021-07-09 DIAGNOSIS — M86172 Other acute osteomyelitis, left ankle and foot: Secondary | ICD-10-CM

## 2021-07-09 DIAGNOSIS — T8130XA Disruption of wound, unspecified, initial encounter: Secondary | ICD-10-CM

## 2021-07-09 NOTE — Progress Notes (Signed)
Subjective:  Patient ID: [name], male    DOB: 1956/06/08,  MRN: [id number]  Chief Complaint  Patient presents with   Routine Post Op    POV #4 DOS 05/05/2021 LT METATARSAL AMPUTATION W/CLOSURE     DOS: 05/05/2021 Procedure: Left transmetatarsal amputation  65 y.o. male returns for post-op check.  Patient states is doing well.  He has not been changing the bandage.  He denies any other acute event the bandage has been clean dry and intact.  He is nonweightbearing to the left lower extremity in a cam boot.  Review of Systems: Negative except as noted in the HPI. Denies N/V/F/Ch.  Past Medical History:  Diagnosis Date   Anxiety    Arthritis    Cholecystitis, acute 12/20/2013   Lap chole 6/5   Cocaine abuse ([name])    COPD (chronic obstructive pulmonary disease) ([name])    Coronary atherosclerosis of native coronary artery    a. 03/09/2013 Cath/PCI: LM nl, LAD: 50p, 21m (2.5x16 promus DES), LCX nl, OM1 min irregs, LPL/LPDA diff dzs, RCA nondom, mod diff dzs, EF 55%.   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 ([place]) 04/24/2021   History of nephrolithiasis    History of pneumonia    History of stroke    Right MCA distribution, residual left-sided weakness   Hyperlipidemia    Noncompliance    Osteomyelitis of second toe of left foot ([place]) 04/24/2021   Renal cell carcinoma ([name])    Status post radical right nephrectomy August 2015   Sleep apnea    On CPAP, 4L O2 no cpap at home yet   Type 2 diabetes mellitus ([place]) 2011    Current Outpatient Medications:    acetaminophen (TYLENOL) 325 MG tablet, Take 650 mg by mouth as needed (pain)., Disp: , Rfl:    ALPRAZolam (XANAX) 0.5 MG tablet, Take 1 tablet (0.5 mg total) by mouth 3 (three) times daily as needed for anxiety. (Patient not taking: Reported on 06/05/2021), Disp: , Rfl: 0   ALPRAZolam  (XANAX) 1 MG tablet, Take 1 mg by mouth 3 (three) times daily as needed., Disp: , Rfl:    atorvastatin (LIPITOR) 20 MG tablet, Take 20 mg by mouth at bedtime., Disp: , Rfl: 1   azithromycin (ZITHROMAX) 250 MG tablet, Take 2 tablets first day then 1 tablet daily until gone, Disp: 6 tablet, Rfl: 0   Budeson-Glycopyrrol-Formoterol (BREZTRI AEROSPHERE) 160-9-4.8 MCG/ACT AERO, Inhale 2 puffs into the lungs in the morning and at bedtime., Disp: 10.7 g, Rfl: 5   budesonide (PULMICORT) 1 MG/2ML nebulizer solution, Take 1 mg by nebulization as needed (shortness of breath)., Disp: , Rfl:    clopidogrel (PLAVIX) 75 MG tablet, TAKE ONE TABLET BY MOUTH EVERY DAY, Disp: 90 tablet, Rfl: 2   diltiazem (CARDIZEM CD) 180 MG 24 hr capsule, TAKE ONE CAPSULE BY MOUTH EVERY DAY, Disp: 90 capsule, Rfl: 2   diphenhydrAMINE (BENADRYL) 25 mg capsule, Take 25 mg by mouth every 6 (six) hours as needed for itching (take with percocet)., Disp: , Rfl:    docusate sodium (COLACE) 100 MG capsule, Take 200 mg by mouth at bedtime. , Disp: , Rfl:    doxycycline (VIBRA-TABS) 100 MG tablet, Take 1 tablet (100 mg total) by mouth 2 (two) times daily., Disp: 60 tablet, Rfl: 0   esomeprazole ([place])  40 MG capsule, Take 40 mg by mouth at bedtime. Pt only take as needed, Disp: , Rfl:    FLUoxetine (PROZAC) 20 MG capsule, Take by mouth., Disp: , Rfl:    FLUoxetine (PROZAC) 40 MG capsule, Take 40 mg by mouth daily. Take with 10mg  capsule for a total of 50mg  daily., Disp: , Rfl:    formoterol (PERFOROMIST) 20 MCG/2ML nebulizer solution, Take 20 mcg by nebulization as needed (shortness of breath)., Disp: , Rfl:    gabapentin (NEURONTIN) 300 MG capsule, Take 900 mg by mouth 3 (three) times daily., Disp: , Rfl:    glucose blood (TRUE METRIX BLOOD GLUCOSE TEST) test strip, , Disp: , Rfl:    insulin regular human CONCENTRATED (HUMULIN R) 500 UNIT/ML injection, Inject 90 Units into the skin 3 (three) times daily with meals., Disp: , Rfl:     isosorbide mononitrate (IMDUR) 60 MG 24 hr tablet, TAKE ONE TABLET BY MOUTH EVERY DAY, Disp: 90 tablet, Rfl: 1   linagliptin (TRADJENTA) 5 MG TABS tablet, Take 5 mg by mouth daily., Disp: , Rfl:    nitroGLYCERIN (NITROSTAT) 0.4 MG SL tablet, PLACE ONE tablet UNDER THE TONGUE every FIVE minutes as needed FOR CHEST PAIN. IF 3 TABLETS ARE NECESSARY CALL 911, Disp: 25 tablet, Rfl: 3   oxybutynin (DITROPAN-XL) 10 MG 24 hr tablet, Take 10 mg by mouth daily., Disp: , Rfl: 11   potassium chloride SA (K-DUR,KLOR-CON) 20 MEQ tablet, Take 20 mEq by mouth 2 (two) times daily. , Disp: , Rfl:    pregabalin (LYRICA) 25 MG capsule, Take 25 mg by mouth 2 (two) times daily., Disp: , Rfl:    Probiotic Product (PROBIOTIC PO), Take 1 capsule by mouth daily., Disp: , Rfl:    sucralfate (CARAFATE) 1 g tablet, TAKE ONE TABLET BY MOUTH TWICE DAILY ON an empty stomach, Disp: , Rfl:    tamsulosin (FLOMAX) 0.4 MG CAPS capsule, Take 0.4 mg by mouth daily. , Disp: , Rfl:    torsemide (DEMADEX) 20 MG tablet, TAKE THREE TABLETS BY MOUTH TWICE DAILY, Disp: 540 tablet, Rfl: 0   zolpidem (AMBIEN) 5 MG tablet, Take 5 mg by mouth at bedtime. For insomnia., Disp: , Rfl:   Social History   Tobacco Use  Smoking Status Every Day   Packs/day: 1.00   Years: 41.00   Pack years: 41.00   Types: Cigarettes   Start date: 05/03/1972  Smokeless Tobacco Never  Tobacco Comments   1/2 ppd 06/05/21    Allergies  Allergen Reactions   Lisinopril Other (See Comments)    Other reaction(s): worsening kidney function   Oxycodone Itching and Nausea Only    Pt is able to tolerate if taken with benadryl   Objective:  There were no vitals filed for this visit. There is no height or weight on file to calculate BMI. Constitutional Well developed. Well nourished.  Vascular Foot warm and well perfused. Capillary refill normal to all digits.   Neurologic Normal speech. Oriented to person, place, and time. Epicritic sensation to light touch  grossly present bilaterally.  Dermatologic Deep dehiscence to the lateral aspect of the transmetatarsal amputation noted.  Probes down to deep bone no purulent drainage noted.  Malodor present.  Worsening of the wound noted.  Mild erythema present.  Orthopedic: No further tenderness to palpation noted about the surgical site.   Radiographs: None Assessment:   1. Osteomyelitis of second toe of left foot ([place])   2. Osteomyelitis of foot, left, acute ([place])  3. Wound dehiscence      Plan:  Patient was evaluated and treated and all questions answered.  S/p foot surgery left transmetatarsal amputation with wound dehiscence -All questions and concerns were discussed with the patient in extensive detail -Patient has worsening of the wound with malodor present.  No purulent drainage was clinically expressed.  The wound does probe down to bone and multiple sites.  Given that patient has uncontrolled A1c and finding of malodor and worsening of the wound I believe patient will benefit from IV antibiotics and further surgical management with proximal amputation. -I discussed with the patient to go to the emergency room to be admitted for IV antibiotics -He will need an MRI to evaluate the extent of osteomyelitis -Nonweightbearing to the left lower extremity with a cam boot and knee scooter -Local wound care with Betadine wet-to-dry -Patient high risk of undergoing below the knee amputation/major amputation given the nature of the wound in the setting of uncontrolled diabetes.  I discussed this with patient extensive detail he states understanding  No follow-ups on file.

## 2021-07-10 DIAGNOSIS — C641 Malignant neoplasm of right kidney, except renal pelvis: Secondary | ICD-10-CM | POA: Diagnosis not present

## 2021-07-10 DIAGNOSIS — E1169 Type 2 diabetes mellitus with other specified complication: Secondary | ICD-10-CM | POA: Diagnosis not present

## 2021-07-10 DIAGNOSIS — K219 Gastro-esophageal reflux disease without esophagitis: Secondary | ICD-10-CM | POA: Diagnosis not present

## 2021-07-10 DIAGNOSIS — Z9181 History of falling: Secondary | ICD-10-CM | POA: Diagnosis not present

## 2021-07-10 DIAGNOSIS — J449 Chronic obstructive pulmonary disease, unspecified: Secondary | ICD-10-CM | POA: Diagnosis not present

## 2021-07-10 DIAGNOSIS — I10 Essential (primary) hypertension: Secondary | ICD-10-CM | POA: Diagnosis not present

## 2021-07-10 DIAGNOSIS — E1142 Type 2 diabetes mellitus with diabetic polyneuropathy: Secondary | ICD-10-CM | POA: Diagnosis not present

## 2021-07-10 DIAGNOSIS — Z7902 Long term (current) use of antithrombotics/antiplatelets: Secondary | ICD-10-CM | POA: Diagnosis not present

## 2021-07-10 DIAGNOSIS — M86172 Other acute osteomyelitis, left ankle and foot: Secondary | ICD-10-CM | POA: Diagnosis not present

## 2021-07-10 DIAGNOSIS — G4730 Sleep apnea, unspecified: Secondary | ICD-10-CM | POA: Diagnosis not present

## 2021-07-10 DIAGNOSIS — Z4801 Encounter for change or removal of surgical wound dressing: Secondary | ICD-10-CM | POA: Diagnosis not present

## 2021-07-10 DIAGNOSIS — M1990 Unspecified osteoarthritis, unspecified site: Secondary | ICD-10-CM | POA: Diagnosis not present

## 2021-07-10 DIAGNOSIS — Z4781 Encounter for orthopedic aftercare following surgical amputation: Secondary | ICD-10-CM | POA: Diagnosis not present

## 2021-07-10 DIAGNOSIS — Z89432 Acquired absence of left foot: Secondary | ICD-10-CM | POA: Diagnosis not present

## 2021-07-10 DIAGNOSIS — F17210 Nicotine dependence, cigarettes, uncomplicated: Secondary | ICD-10-CM | POA: Diagnosis not present

## 2021-07-10 DIAGNOSIS — I2510 Atherosclerotic heart disease of native coronary artery without angina pectoris: Secondary | ICD-10-CM | POA: Diagnosis not present

## 2021-07-10 DIAGNOSIS — F32A Depression, unspecified: Secondary | ICD-10-CM | POA: Diagnosis not present

## 2021-07-10 DIAGNOSIS — Z905 Acquired absence of kidney: Secondary | ICD-10-CM | POA: Diagnosis not present

## 2021-07-10 DIAGNOSIS — Z91199 Patient's noncompliance with other medical treatment and regimen due to unspecified reason: Secondary | ICD-10-CM | POA: Diagnosis not present

## 2021-07-10 DIAGNOSIS — Z79891 Long term (current) use of opiate analgesic: Secondary | ICD-10-CM | POA: Diagnosis not present

## 2021-07-10 DIAGNOSIS — Z7984 Long term (current) use of oral hypoglycemic drugs: Secondary | ICD-10-CM | POA: Diagnosis not present

## 2021-07-14 DIAGNOSIS — G4730 Sleep apnea, unspecified: Secondary | ICD-10-CM | POA: Diagnosis not present

## 2021-07-14 DIAGNOSIS — I10 Essential (primary) hypertension: Secondary | ICD-10-CM | POA: Diagnosis not present

## 2021-07-14 DIAGNOSIS — E1142 Type 2 diabetes mellitus with diabetic polyneuropathy: Secondary | ICD-10-CM | POA: Diagnosis not present

## 2021-07-14 DIAGNOSIS — Z4781 Encounter for orthopedic aftercare following surgical amputation: Secondary | ICD-10-CM | POA: Diagnosis not present

## 2021-07-14 DIAGNOSIS — E1169 Type 2 diabetes mellitus with other specified complication: Secondary | ICD-10-CM | POA: Diagnosis not present

## 2021-07-14 DIAGNOSIS — M1990 Unspecified osteoarthritis, unspecified site: Secondary | ICD-10-CM | POA: Diagnosis not present

## 2021-07-14 DIAGNOSIS — C641 Malignant neoplasm of right kidney, except renal pelvis: Secondary | ICD-10-CM | POA: Diagnosis not present

## 2021-07-14 DIAGNOSIS — Z79891 Long term (current) use of opiate analgesic: Secondary | ICD-10-CM | POA: Diagnosis not present

## 2021-07-14 DIAGNOSIS — Z4801 Encounter for change or removal of surgical wound dressing: Secondary | ICD-10-CM | POA: Diagnosis not present

## 2021-07-14 DIAGNOSIS — I2510 Atherosclerotic heart disease of native coronary artery without angina pectoris: Secondary | ICD-10-CM | POA: Diagnosis not present

## 2021-07-14 DIAGNOSIS — M86172 Other acute osteomyelitis, left ankle and foot: Secondary | ICD-10-CM | POA: Diagnosis not present

## 2021-07-14 DIAGNOSIS — Z91199 Patient's noncompliance with other medical treatment and regimen due to unspecified reason: Secondary | ICD-10-CM | POA: Diagnosis not present

## 2021-07-14 DIAGNOSIS — Z89432 Acquired absence of left foot: Secondary | ICD-10-CM | POA: Diagnosis not present

## 2021-07-14 DIAGNOSIS — Z905 Acquired absence of kidney: Secondary | ICD-10-CM | POA: Diagnosis not present

## 2021-07-14 DIAGNOSIS — F32A Depression, unspecified: Secondary | ICD-10-CM | POA: Diagnosis not present

## 2021-07-14 DIAGNOSIS — F17210 Nicotine dependence, cigarettes, uncomplicated: Secondary | ICD-10-CM | POA: Diagnosis not present

## 2021-07-14 DIAGNOSIS — J449 Chronic obstructive pulmonary disease, unspecified: Secondary | ICD-10-CM | POA: Diagnosis not present

## 2021-07-14 DIAGNOSIS — K219 Gastro-esophageal reflux disease without esophagitis: Secondary | ICD-10-CM | POA: Diagnosis not present

## 2021-07-14 DIAGNOSIS — Z7902 Long term (current) use of antithrombotics/antiplatelets: Secondary | ICD-10-CM | POA: Diagnosis not present

## 2021-07-14 DIAGNOSIS — Z7984 Long term (current) use of oral hypoglycemic drugs: Secondary | ICD-10-CM | POA: Diagnosis not present

## 2021-07-14 DIAGNOSIS — Z9181 History of falling: Secondary | ICD-10-CM | POA: Diagnosis not present

## 2021-07-15 ENCOUNTER — Other Ambulatory Visit: Payer: Self-pay

## 2021-07-15 ENCOUNTER — Inpatient Hospital Stay (HOSPITAL_COMMUNITY)
Admission: EM | Admit: 2021-07-15 | Discharge: 2021-07-18 | DRG: 516 | Discharge status: Against Medical Advice | Payer: Medicare Other | Attending: Internal Medicine | Admitting: Internal Medicine

## 2021-07-15 ENCOUNTER — Emergency Department (HOSPITAL_COMMUNITY): Payer: Medicare Other

## 2021-07-15 ENCOUNTER — Inpatient Hospital Stay (HOSPITAL_COMMUNITY): Payer: Medicare Other

## 2021-07-15 DIAGNOSIS — N400 Enlarged prostate without lower urinary tract symptoms: Secondary | ICD-10-CM | POA: Diagnosis present

## 2021-07-15 DIAGNOSIS — E1122 Type 2 diabetes mellitus with diabetic chronic kidney disease: Secondary | ICD-10-CM | POA: Diagnosis not present

## 2021-07-15 DIAGNOSIS — Z823 Family history of stroke: Secondary | ICD-10-CM

## 2021-07-15 DIAGNOSIS — M86172 Other acute osteomyelitis, left ankle and foot: Secondary | ICD-10-CM | POA: Diagnosis present

## 2021-07-15 DIAGNOSIS — J449 Chronic obstructive pulmonary disease, unspecified: Secondary | ICD-10-CM | POA: Diagnosis not present

## 2021-07-15 DIAGNOSIS — Z885 Allergy status to narcotic agent status: Secondary | ICD-10-CM

## 2021-07-15 DIAGNOSIS — Z801 Family history of malignant neoplasm of trachea, bronchus and lung: Secondary | ICD-10-CM

## 2021-07-15 DIAGNOSIS — Z9861 Coronary angioplasty status: Secondary | ICD-10-CM

## 2021-07-15 DIAGNOSIS — E785 Hyperlipidemia, unspecified: Secondary | ICD-10-CM | POA: Diagnosis not present

## 2021-07-15 DIAGNOSIS — J9611 Chronic respiratory failure with hypoxia: Secondary | ICD-10-CM | POA: Diagnosis present

## 2021-07-15 DIAGNOSIS — I129 Hypertensive chronic kidney disease with stage 1 through stage 4 chronic kidney disease, or unspecified chronic kidney disease: Secondary | ICD-10-CM | POA: Diagnosis present

## 2021-07-15 DIAGNOSIS — I2510 Atherosclerotic heart disease of native coronary artery without angina pectoris: Secondary | ICD-10-CM | POA: Diagnosis present

## 2021-07-15 DIAGNOSIS — I70245 Atherosclerosis of native arteries of left leg with ulceration of other part of foot: Secondary | ICD-10-CM | POA: Diagnosis not present

## 2021-07-15 DIAGNOSIS — I70249 Atherosclerosis of native arteries of left leg with ulceration of unspecified site: Secondary | ICD-10-CM | POA: Diagnosis present

## 2021-07-15 DIAGNOSIS — F32A Depression, unspecified: Secondary | ICD-10-CM | POA: Diagnosis not present

## 2021-07-15 DIAGNOSIS — F17210 Nicotine dependence, cigarettes, uncomplicated: Secondary | ICD-10-CM | POA: Diagnosis present

## 2021-07-15 DIAGNOSIS — K219 Gastro-esophageal reflux disease without esophagitis: Secondary | ICD-10-CM | POA: Diagnosis present

## 2021-07-15 DIAGNOSIS — L02612 Cutaneous abscess of left foot: Secondary | ICD-10-CM | POA: Diagnosis present

## 2021-07-15 DIAGNOSIS — Z888 Allergy status to other drugs, medicaments and biological substances status: Secondary | ICD-10-CM | POA: Diagnosis not present

## 2021-07-15 DIAGNOSIS — L97929 Non-pressure chronic ulcer of unspecified part of left lower leg with unspecified severity: Secondary | ICD-10-CM | POA: Diagnosis not present

## 2021-07-15 DIAGNOSIS — M869 Osteomyelitis, unspecified: Secondary | ICD-10-CM | POA: Diagnosis not present

## 2021-07-15 DIAGNOSIS — E1165 Type 2 diabetes mellitus with hyperglycemia: Secondary | ICD-10-CM | POA: Diagnosis present

## 2021-07-15 DIAGNOSIS — Z794 Long term (current) use of insulin: Secondary | ICD-10-CM

## 2021-07-15 DIAGNOSIS — E1169 Type 2 diabetes mellitus with other specified complication: Secondary | ICD-10-CM | POA: Diagnosis not present

## 2021-07-15 DIAGNOSIS — N1832 Chronic kidney disease, stage 3b: Secondary | ICD-10-CM | POA: Diagnosis present

## 2021-07-15 DIAGNOSIS — T8789 Other complications of amputation stump: Secondary | ICD-10-CM | POA: Diagnosis not present

## 2021-07-15 DIAGNOSIS — Z95820 Peripheral vascular angioplasty status with implants and grafts: Secondary | ICD-10-CM | POA: Diagnosis not present

## 2021-07-15 DIAGNOSIS — E11621 Type 2 diabetes mellitus with foot ulcer: Secondary | ICD-10-CM | POA: Diagnosis present

## 2021-07-15 DIAGNOSIS — Z87442 Personal history of urinary calculi: Secondary | ICD-10-CM

## 2021-07-15 DIAGNOSIS — E1151 Type 2 diabetes mellitus with diabetic peripheral angiopathy without gangrene: Secondary | ICD-10-CM | POA: Diagnosis present

## 2021-07-15 DIAGNOSIS — Z8249 Family history of ischemic heart disease and other diseases of the circulatory system: Secondary | ICD-10-CM

## 2021-07-15 DIAGNOSIS — M79672 Pain in left foot: Secondary | ICD-10-CM | POA: Diagnosis not present

## 2021-07-15 DIAGNOSIS — I739 Peripheral vascular disease, unspecified: Secondary | ICD-10-CM | POA: Diagnosis not present

## 2021-07-15 DIAGNOSIS — Z5329 Procedure and treatment not carried out because of patient's decision for other reasons: Secondary | ICD-10-CM | POA: Diagnosis not present

## 2021-07-15 DIAGNOSIS — Y835 Amputation of limb(s) as the cause of abnormal reaction of the patient, or of later complication, without mention of misadventure at the time of the procedure: Secondary | ICD-10-CM | POA: Diagnosis present

## 2021-07-15 DIAGNOSIS — E1140 Type 2 diabetes mellitus with diabetic neuropathy, unspecified: Secondary | ICD-10-CM | POA: Diagnosis present

## 2021-07-15 DIAGNOSIS — E119 Type 2 diabetes mellitus without complications: Secondary | ICD-10-CM

## 2021-07-15 DIAGNOSIS — Z20822 Contact with and (suspected) exposure to covid-19: Secondary | ICD-10-CM | POA: Diagnosis not present

## 2021-07-15 DIAGNOSIS — Z89432 Acquired absence of left foot: Secondary | ICD-10-CM

## 2021-07-15 DIAGNOSIS — Z905 Acquired absence of kidney: Secondary | ICD-10-CM

## 2021-07-15 DIAGNOSIS — M86672 Other chronic osteomyelitis, left ankle and foot: Secondary | ICD-10-CM | POA: Diagnosis not present

## 2021-07-15 DIAGNOSIS — Z9981 Dependence on supplemental oxygen: Secondary | ICD-10-CM

## 2021-07-15 DIAGNOSIS — I10 Essential (primary) hypertension: Secondary | ICD-10-CM | POA: Diagnosis not present

## 2021-07-15 DIAGNOSIS — Z8673 Personal history of transient ischemic attack (TIA), and cerebral infarction without residual deficits: Secondary | ICD-10-CM

## 2021-07-15 DIAGNOSIS — Z66 Do not resuscitate: Secondary | ICD-10-CM | POA: Diagnosis present

## 2021-07-15 DIAGNOSIS — Z9049 Acquired absence of other specified parts of digestive tract: Secondary | ICD-10-CM

## 2021-07-15 DIAGNOSIS — M7989 Other specified soft tissue disorders: Secondary | ICD-10-CM | POA: Diagnosis not present

## 2021-07-15 DIAGNOSIS — G4733 Obstructive sleep apnea (adult) (pediatric): Secondary | ICD-10-CM | POA: Diagnosis present

## 2021-07-15 DIAGNOSIS — L97529 Non-pressure chronic ulcer of other part of left foot with unspecified severity: Secondary | ICD-10-CM | POA: Diagnosis not present

## 2021-07-15 DIAGNOSIS — Z833 Family history of diabetes mellitus: Secondary | ICD-10-CM

## 2021-07-15 DIAGNOSIS — E782 Mixed hyperlipidemia: Secondary | ICD-10-CM | POA: Diagnosis present

## 2021-07-15 DIAGNOSIS — F419 Anxiety disorder, unspecified: Secondary | ICD-10-CM | POA: Diagnosis present

## 2021-07-15 DIAGNOSIS — R600 Localized edema: Secondary | ICD-10-CM | POA: Diagnosis not present

## 2021-07-15 DIAGNOSIS — Z85528 Personal history of other malignant neoplasm of kidney: Secondary | ICD-10-CM

## 2021-07-15 LAB — COMPREHENSIVE METABOLIC PANEL
ALT: 13 U/L (ref 0–44)
AST: 12 U/L — ABNORMAL LOW (ref 15–41)
Albumin: 3 g/dL — ABNORMAL LOW (ref 3.5–5.0)
Alkaline Phosphatase: 68 U/L (ref 38–126)
Anion gap: 9 (ref 5–15)
BUN: 8 mg/dL (ref 8–23)
CO2: 25 mmol/L (ref 22–32)
Calcium: 9.8 mg/dL (ref 8.9–10.3)
Chloride: 102 mmol/L (ref 98–111)
Creatinine, Ser: 1.27 mg/dL — ABNORMAL HIGH (ref 0.61–1.24)
GFR, Estimated: >60 mL/min (ref >60)
Glucose, Bld: 290 mg/dL — ABNORMAL HIGH (ref 70–99)
Potassium: 3.9 mmol/L (ref 3.5–5.1)
Sodium: 136 mmol/L (ref 135–145)
Total Bilirubin: 0.3 mg/dL (ref 0.3–1.2)
Total Protein: 7.3 g/dL (ref 6.5–8.1)

## 2021-07-15 LAB — CBC WITH DIFFERENTIAL/PLATELET
Abs Immature Granulocytes: 0.07 10*3/uL (ref 0.00–0.07)
Basophils Absolute: 0 10*3/uL (ref 0.0–0.1)
Basophils Relative: 1 %
Eosinophils Absolute: 0.1 10*3/uL (ref 0.0–0.5)
Eosinophils Relative: 1 %
HCT: 40.3 % (ref 39.0–52.0)
Hemoglobin: 12.9 g/dL — ABNORMAL LOW (ref 13.0–17.0)
Immature Granulocytes: 1 %
Lymphocytes Relative: 17 %
Lymphs Abs: 1.3 10*3/uL (ref 0.7–4.0)
MCH: 26.9 pg (ref 26.0–34.0)
MCHC: 32 g/dL (ref 30.0–36.0)
MCV: 84 fL (ref 80.0–100.0)
Monocytes Absolute: 0.4 10*3/uL (ref 0.1–1.0)
Monocytes Relative: 5 %
Neutro Abs: 5.8 10*3/uL (ref 1.7–7.7)
Neutrophils Relative %: 75 %
Platelets: 284 10*3/uL (ref 150–400)
RBC: 4.8 MIL/uL (ref 4.22–5.81)
RDW: 13.2 % (ref 11.5–15.5)
WBC: 7.6 10*3/uL (ref 4.0–10.5)
nRBC: 0 % (ref 0.0–0.2)

## 2021-07-15 LAB — LACTIC ACID, PLASMA
Lactic Acid, Venous: 1.8 mmol/L (ref 0.5–1.9)
Lactic Acid, Venous: 1.2 mmol/L (ref 0.5–1.9)

## 2021-07-15 LAB — RESP PANEL BY RT-PCR (FLU A&B, COVID) ARPGX2
Influenza A by PCR: NEGATIVE
Influenza B by PCR: NEGATIVE
SARS Coronavirus 2 by RT PCR: NEGATIVE

## 2021-07-15 LAB — APTT: aPTT: 31 seconds (ref 24–36)

## 2021-07-15 LAB — PROTIME-INR
INR: 1 (ref 0.8–1.2)
Prothrombin Time: 12.9 seconds (ref 11.4–15.2)

## 2021-07-15 NOTE — H&P (Addendum)
History and Physical    [name] TFT:[id number] DOB: 01/28/1956 DOA: 07/15/2021  PCP: Carolee Rota, NP Patient coming from: Home  Chief Complaint: Left foot wound  HPI: [name] is a 65 y.o. male with medical history significant of COPD, chronic hypoxic respiratory failure on home oxygen, polysubstance abuse (tobacco and cocaine), CAD status post PCI, anxiety, depression, BPH, hypertension, GERD, CVA, hyperlipidemia, renal cell carcinoma status post radical nephrectomy in 2015, OSA, medical noncompliance, poorly controlled insulin-dependent type 2 diabetes, status post transmetatarsal amputation of the left foot due to osteomyelitis followed by Dr. Posey Pronto from podiatry.  During recent office visit on 12/21, he was noted to have worsening left foot wound for which Dr. Posey Pronto had recommended admission for IV antibiotics and further surgical management for proximal amputation.  Patient did not wish to be admitted at that time due to the holidays and presented to the ED today to be admitted.  No fever or leukocytosis.  Hemoglobin mildly low but stable.  Blood glucose 290.  Creatinine 1.2, stable.  Lactic acid normal x2.  Blood cultures drawn.  Screening COVID and influenza PCR negative.  X-ray of left foot showing findings concerning for osteomyelitis involving the surgical margin of the second and fifth metatarsal bones. On call podiatrist Dr. Sherryle Lis consulted - recommended MRI and keeping n.p.o. after midnight in anticipation of surgery tomorrow.  Recommended not starting antibiotics until intraoperative cultures are obtained.  Podiatry will see the patient in the morning.  Patient states his podiatrist Dr. Posey Pronto sent him here for amputation as his foot is infected.  States he was told this was due to him having poorly controlled diabetes.  Reports fever of 101 F at home.  Reports taking all of his diabetes medications regularly.  Reports chronic shortness of breath due to COPD and uses 6 L  oxygen all the time.  Also reports history of sleep apnea but does not use CPAP.  He does not think his shortness of breath is any worse from his baseline.  Denies cough or chest pain.  No other complaints.  Review of Systems:  All systems reviewed and apart from history of presenting illness, are negative.  Past Medical History:  Diagnosis Date   Anxiety    Arthritis    Cholecystitis, acute 12/20/2013   Lap chole 6/5   Cocaine abuse ([name])    COPD (chronic obstructive pulmonary disease) ([name])    Coronary atherosclerosis of native coronary artery    a. 03/09/2013 Cath/PCI: LM nl, LAD: 50p, 67m (2.5x16 promus DES), LCX nl, OM1 min irregs, LPL/LPDA diff dzs, RCA nondom, mod diff dzs, EF 55%.   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 ([place]) 04/24/2021   History of nephrolithiasis    History of pneumonia    History of stroke    Right MCA distribution, residual left-sided weakness   Hyperlipidemia    Noncompliance    Osteomyelitis of second toe of left foot ([place]) 04/24/2021   Renal cell carcinoma ([place])    Status post radical right nephrectomy August 2015   Sleep apnea    On CPAP, 4L O2 no cpap at home yet   Type 2 diabetes mellitus ([place]) 2011    Past Surgical History:  Procedure Laterality Date   AMPUTATION Left 12/27/2020   Procedure: PARTIAL FIRST RAY AMPUTATION OF FOOT;  Surgeon: Felipa Furnace, DPM;  Location: [place];  Service: Podiatry;  Laterality: Left;   APPENDECTOMY  1970's   CHOLECYSTECTOMY N/A 12/22/2013   Procedure: LAPAROSCOPIC CHOLECYSTECTOMY ;  Surgeon: [name], MD;  Location: [place];  Service: General;  Laterality: N/A;   ENDARTERECTOMY Right 10/08/2014   Procedure: Right ENDARTERECTOMY CAROTID;  Surgeon: [name], MD;  Location: [place];  Service: Vascular;  Laterality: Right;   LAPAROSCOPIC LYSIS OF ADHESIONS  02/21/2014    Procedure: LAPAROSCOPIC LYSIS OF ADHESIONS EXTINSIVE;  Surgeon: [name], MD;  Location: [facility];  Service: Urology;;   LEFT HEART CATHETERIZATION WITH CORONARY ANGIOGRAM N/A 06/02/2012   Procedure: LEFT HEART CATHETERIZATION WITH CORONARY ANGIOGRAM;  Surgeon: [name], MD;  Location[facility] CATH LAB;  Service: Cardiovascular;  Laterality: N/A;   LEFT HEART CATHETERIZATION WITH CORONARY ANGIOGRAM N/A 03/09/2013   Procedure: LEFT HEART CATHETERIZATION WITH CORONARY ANGIOGRAM;  Surgeon: [name], MD;  Location: [facility] CATH LAB;  Service: Cardiovascular;  Laterality: N/A;   LEFT HEART CATHETERIZATION WITH CORONARY ANGIOGRAM N/A 12/20/2013   Procedure: LEFT HEART CATHETERIZATION WITH CORONARY ANGIOGRAM;  Surgeon: [name], MD;  Location: [facility];  Service: Cardiovascular;  Laterality: N/A;   LUNG BIOPSY Left 05/18/2014   Procedure: LUNG BIOPSY left upper lobe & left lower lobe;  Surgeon: [name], MD;  Location: [place];  Service: Thoracic;  Laterality: Left;   PATCH ANGIOPLASTY Right 10/08/2014   Procedure: PATCH ANGIOPLASTY Right Carotid;  Surgeon: [name], MD;  Location: [place];  Service: Vascular;  Laterality: Right;   ROBOT ASSISTED LAPAROSCOPIC NEPHRECTOMY Right 02/21/2014   Procedure: ROBOTIC ASSISTED LAPAROSCOPIC RIGHT NEPHRECTOMY ;  Surgeon: [name], MD;  Location: [facility];  Service: Urology;  Laterality: Right;   VIDEO ASSISTED THORACOSCOPY Left 05/18/2014   Procedure: LEFT VIDEO ASSISTED THORACOSCOPY;  Surgeon: [name], MD;  Location: [place];  Service: Thoracic;  Laterality: Left;   VIDEO BRONCHOSCOPY  05/18/2014   Procedure: VIDEO BRONCHOSCOPY;  Surgeon: [name], MD;  Location: [place];  Service: Thoracic;;     reports that he has been smoking cigarettes. He started smoking about 49 years ago. He has a 41.00 pack-year smoking history. He has never used smokeless tobacco. He reports that he does not drink alcohol and does not  use drugs.  Allergies  Allergen Reactions   Lisinopril Other (See Comments)    Other reaction(s): worsening kidney function   Oxycodone Itching and Nausea Only    Pt is able to tolerate if taken with benadryl    Family History  Problem Relation Age of Onset   Cancer Mother        Thyroid - living in her 22's.   Hypertension Other    Diabetes Other    Stroke Other    Lung cancer Father    CAD Father    Cancer Maternal Grandmother        Breast   Cancer Maternal Grandfather        Throat and stomach   CAD Brother     Prior to Admission medications   Medication Sig Start Date End Date Taking? Authorizing Provider  acetaminophen (TYLENOL) 325 MG tablet Take 650 mg by mouth as needed (pain). 06/03/18  Yes [provider]  ALPRAZolam Duanne Moron) 1 MG tablet Take 1 mg by mouth 3 (three) times daily as needed. 01/07/21  Yes [provider]  atorvastatin (LIPITOR) 20 MG tablet Take 20 mg by  mouth at bedtime. 04/26/18  Yes [provider]  Budeson-Glycopyrrol-Formoterol (BREZTRI AEROSPHERE) 160-9-4.8 MCG/ACT AERO Inhale 2 puffs into the lungs in the morning and at bedtime. 02/26/21  Yes Brand Males, MD  budesonide (PULMICORT) 1 MG/2ML nebulizer solution Take 1 mg by nebulization as needed (shortness of breath). 01/16/20  Yes [provider]  clopidogrel (PLAVIX) 75 MG tablet TAKE ONE TABLET BY MOUTH EVERY DAY Patient taking differently: Take 75 mg by mouth daily. 12/30/20  Yes Satira Sark, MD  diltiazem (CARDIZEM CD) 180 MG 24 hr capsule TAKE ONE CAPSULE BY MOUTH EVERY DAY Patient taking differently: Take 180 mg by mouth daily. 12/30/20  Yes Satira Sark, MD  diphenhydrAMINE (BENADRYL) 25 mg capsule Take 25 mg by mouth every 6 (six) hours as needed for itching (take with percocet).   Yes [provider]  esomeprazole (NEXIUM) 40 MG capsule Take 40 mg by mouth at bedtime. Pt only take as needed   Yes [provider]   FLUoxetine (PROZAC) 20 MG capsule Take 20 mg by mouth daily. 01/03/21  Yes [provider]  FLUoxetine (PROZAC) 40 MG capsule Take 40 mg by mouth daily. Take with 10mg  capsule for a total of 50mg  daily.   Yes [provider]  formoterol (PERFOROMIST) 20 MCG/2ML nebulizer solution Take 20 mcg by nebulization as needed (shortness of breath). 01/16/20  Yes [provider]  gabapentin (NEURONTIN) 300 MG capsule Take 900 mg by mouth 3 (three) times daily.   Yes [provider]  insulin regular human CONCENTRATED (HUMULIN R) 500 UNIT/ML injection Inject 90 Units into the skin 3 (three) times daily with meals.   Yes [provider]  isosorbide mononitrate (IMDUR) 60 MG 24 hr tablet TAKE ONE TABLET BY MOUTH EVERY DAY Patient taking differently: Take 60 mg by mouth daily. 06/30/21  Yes Satira Sark, MD  linagliptin (TRADJENTA) 5 MG TABS tablet Take 5 mg by mouth daily.   Yes [provider]  nitroGLYCERIN (NITROSTAT) 0.4 MG SL tablet PLACE ONE tablet UNDER THE TONGUE every FIVE minutes as needed FOR CHEST PAIN. IF 3 TABLETS ARE NECESSARY CALL 911 Patient taking differently: Place 0.4 mg under the tongue every 5 (five) minutes as needed for chest pain. 05/07/21  Yes Satira Sark, MD  potassium chloride SA (K-DUR,KLOR-CON) 20 MEQ tablet Take 20 mEq by mouth 2 (two) times daily.    Yes [provider]  pregabalin (LYRICA) 25 MG capsule Take 25 mg by mouth 2 (two) times daily. 02/14/19  Yes [provider]  Probiotic Product (PROBIOTIC PO) Take 1 capsule by mouth daily.   Yes [provider]  tamsulosin (FLOMAX) 0.4 MG CAPS capsule Take 0.4 mg by mouth daily.    Yes [provider]  torsemide (DEMADEX) 20 MG tablet TAKE THREE TABLETS BY MOUTH TWICE DAILY Patient taking differently: Take 20 mg by mouth 2 (two) times daily. 06/26/21  Yes Satira Sark, MD  zolpidem (AMBIEN) 5 MG tablet Take 5 mg by mouth at  bedtime. For insomnia. 03/03/19  Yes [provider]  ALPRAZolam Duanne Moron) 0.5 MG tablet Take 1 tablet (0.5 mg total) by mouth 3 (three) times daily as needed for anxiety. Patient not taking: Reported on 06/05/2021 06/03/18   Dhungel, Flonnie Overman, MD  azithromycin (ZITHROMAX) 250 MG tablet Take 2 tablets first day then 1 tablet daily until gone Patient not taking: Reported on 07/15/2021 06/05/21   Brand Males, MD  doxycycline (VIBRA-TABS) 100 MG tablet Take 1  tablet (100 mg total) by mouth 2 (two) times daily. Patient not taking: Reported on 07/15/2021 06/18/21 07/18/21  Felipa Furnace, DPM  glucose blood (TRUE METRIX BLOOD GLUCOSE TEST) test strip     [provider]    Physical Exam: Vitals:   07/15/21 1227 07/15/21 1727 07/15/21 2036 07/16/21 0000  BP: (!) 115/92 126/78 (!) 132/96 (!) 167/92  Pulse: 95 86 88 83  Resp: 16 16 20  (!) 22  Temp: 98.6 F (37 C)     TempSrc: Oral     SpO2: 98% 98% 98% 94%    Physical Exam Constitutional:      General: He is not in acute distress. HENT:     Head: Normocephalic and atraumatic.  Eyes:     Extraocular Movements: Extraocular movements intact.     Conjunctiva/sclera: Conjunctivae normal.  Cardiovascular:     Rate and Rhythm: Normal rate and regular rhythm.     Pulses: Normal pulses.  Pulmonary:     Effort: Pulmonary effort is normal. No respiratory distress.     Breath sounds: Normal breath sounds. No wheezing or rales.  Abdominal:     General: Bowel sounds are normal. There is no distension.     Palpations: Abdomen is soft.     Tenderness: There is no abdominal tenderness.  Musculoskeletal:     Cervical back: Normal range of motion and neck supple.  Skin:    General: Skin is warm and dry.  Neurological:     General: No focal deficit present.     Mental Status: He is alert and oriented to person, place, and time.       Labs on Admission: I have personally reviewed following labs and imaging  studies  CBC: Recent Labs  Lab 07/15/21 1245  WBC 7.6  NEUTROABS 5.8  HGB 12.9*  HCT 40.3  MCV 84.0  PLT 315   Basic Metabolic Panel: Recent Labs  Lab 07/15/21 1245  NA 136  K 3.9  CL 102  CO2 25  GLUCOSE 290*  BUN 8  CREATININE 1.27*  CALCIUM 9.8   GFR: CrCl cannot be calculated (Unknown ideal weight.). Liver Function Tests: Recent Labs  Lab 07/15/21 1245  AST 12*  ALT 13  ALKPHOS 68  BILITOT 0.3  PROT 7.3  ALBUMIN 3.0*   No results for input(s): LIPASE, AMYLASE in the last 168 hours. No results for input(s): AMMONIA in the last 168 hours. Coagulation Profile: Recent Labs  Lab 07/15/21 1245  INR 1.0   Cardiac Enzymes: No results for input(s): CKTOTAL, CKMB, CKMBINDEX, TROPONINI in the last 168 hours. BNP (last 3 results) No results for input(s): PROBNP in the last 8760 hours. HbA1C: No results for input(s): HGBA1C in the last 72 hours. CBG: No results for input(s): GLUCAP in the last 168 hours. Lipid Profile: No results for input(s): CHOL, HDL, LDLCALC, TRIG, CHOLHDL, LDLDIRECT in the last 72 hours. Thyroid Function Tests: No results for input(s): TSH, T4TOTAL, FREET4, T3FREE, THYROIDAB in the last 72 hours. Anemia Panel: No results for input(s): VITAMINB12, FOLATE, FERRITIN, TIBC, IRON, RETICCTPCT in the last 72 hours. Urine analysis:    Component Value Date/Time   COLORURINE COLORLESS (A) 12/24/2020 2008   APPEARANCEUR CLEAR 12/24/2020 2008   LABSPEC 1.006 12/24/2020 2008   PHURINE 5.0 12/24/2020 2008   GLUCOSEU NEGATIVE 12/24/2020 2008   HGBUR NEGATIVE 12/24/2020 2008   [place] NEGATIVE 12/24/2020 2008   [place] NEGATIVE 12/24/2020 2008   PROTEINUR NEGATIVE 12/24/2020 2008   UROBILINOGEN 0.2  02/28/2015 1310   NITRITE NEGATIVE 12/24/2020 2008   LEUKOCYTESUR NEGATIVE 12/24/2020 2008    Radiological Exams on Admission: DG Foot Complete Left  Result Date: 07/15/2021 CLINICAL DATA:  Pain in left foot.  Status post forefoot  amputation. EXAM: LEFT FOOT - COMPLETE 3+ VIEW COMPARISON:  06/18/2021 FINDINGS: The patient is status post forefoot amputation at the level of the mid shaft of the metatarsal bones. The surgical margin of the second metatarsal bone is irregular with underlying erosive changes which is concerning for osteomyelitis. Bony fragmentation and exuberant periosteal reaction is identified. There is also irregularity along the surgical margin of the fifth metatarsal bone. Diffuse soft tissue swelling appears increased from previous exam. No definite soft tissue gas identified. Unchanged appearance of fracture deformity at the base of the fifth metatarsal bone. IMPRESSION: 1. Findings concerning for osteomyelitis involving the surgical margin of the second and fifth metatarsal bones. 2. Increase in diffuse soft tissue swelling. No soft tissue gas identified. 3. Stable appearance of fracture deformity at the base of the fifth metatarsal bone. Electronically Signed   By: Kerby Moors M.D.   On: 07/15/2021 13:21    EKG: Independently reviewed.  Sinus rhythm with first-degree AV block, no significant change since prior tracing.  Assessment/Plan Principal Problem:   Osteomyelitis ([name]) Active Problems:   Type 2 diabetes mellitus ([name])   Hyperlipidemia   HLD (hyperlipidemia)   Benign prostatic hyperplasia   Left foot second and fifth metatarsal osteomyelitis Patient with history of medical noncompliance and poorly controlled diabetes.  History of transmetatarsal amputation of left foot due to osteomyelitis sent here from podiatry as he was noted to have worsening left foot wound.  Failed outpatient antibiotic therapy.  X-ray of left foot showing findings concerning for osteomyelitis involving the surgical margin of the second and fifth metatarsal bones.  No fever, tachycardia, leukocytosis, or lactic acidosis to suggest sepsis. -Blood cultures drawn.  Podiatry consulted and recommending not starting antibiotics  until intraoperative cultures are obtained.  Plan for surgery tomorrow, keep n.p.o. after midnight.  MRI of left foot ordered by Dr. Sherryle Lis and currently pending.  Poorly controlled insulin-dependent type 2 diabetes History of medical noncompliance and last A1c 11.7 in June 2022.  Labs today showing blood glucose 290. -Repeat A1c.  Sliding scale insulin sensitive every 4 hours as patient will be n.p.o.  Diabetic neuropathy -Continue gabapentin and pregabalin  COPD Chronic hypoxic respiratory failure No signs of acute exacerbation.  Stable on 6 L home oxygen. -Continue Breztri, albuterol as needed.  Continue supplemental oxygen.  CAD status post PCI Stable.  Not endorsing any anginal symptoms. -Hold Plavix at this time.  Continue Cardizem, Imdur, and Lipitor.  Hypertension -Continue Cardizem, Imdur  Hyperlipidemia -Continue Lipitor  Anxiety -Continue Xanax as needed  Addendum: Anxiety/depression He is on Prozac but pharmacy needs to verify his home dose.  GERD -Continue PPI  BPH -Continue Flomax  DVT prophylaxis: SCDs Code Status: Patient wishes to be DNR. Family Communication: No family available at this time. Disposition Plan: Status is: Inpatient  Remains inpatient appropriate because: Needs surgery for osteomyelitis  Level of care: Level of care: Med-Surg  The medical decision making on this patient was of high complexity and the patient is at high risk for clinical deterioration, therefore this is a level 3 visit.  Shela Leff MD [facility]  If 7PM-7AM, please contact night-coverage [email or web address]  07/16/2021, 12:19 AM

## 2021-07-15 NOTE — ED Provider Notes (Signed)
[facility] EMERGENCY DEPARTMENT Provider Note   CSN: [id number] Arrival date & time: 07/15/21  1146     History Chief Complaint  Patient presents with   Wound Check    [name] is a 65 y.o. male.   Wound Check   This patient is a 65 year old male, complaint of left foot discomfort with infection, the patient has a history of COPD as well as a history of diabetes and coronary disease.  He actually has unfortunately developed recurrent infections in his foot and has undergone multiple amputations of his toes of the left foot.  He is followed by Dr. Posey Pronto with podiatry who wanted him to come over to the hospital for admission due to osteomyelitis and the need for further amputation of his foot.  The patient does state that he is having subjective fevers and chills however he does not have a fever on arrival.  He had recently been taking cephalexin but he did not tolerate it very well so he stopped taking it and it has been several days since he has had any antibiotics.  The patient states that his symptoms are progressive, gradually worsening, they are now moderate to severe, they are not associated with purulent drainage but he does have a bit of a foul smell coming from it.  He is getting his dressings changed and packing changed regularly.  Past Medical History:  Diagnosis Date   Anxiety    Arthritis    Cholecystitis, acute 12/20/2013   Lap chole 6/5   Cocaine abuse ([name])    COPD (chronic obstructive pulmonary disease) ([name])    Coronary atherosclerosis of native coronary artery    a. 03/09/2013 Cath/PCI: LM nl, LAD: 50p, 41m (2.5x16 promus DES), LCX nl, OM1 min irregs, LPL/LPDA diff dzs, RCA nondom, mod diff dzs, EF 55%.   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 ([place]) 04/24/2021   History of nephrolithiasis    History of  pneumonia    History of stroke    Right MCA distribution, residual left-sided weakness   Hyperlipidemia    Noncompliance    Osteomyelitis of second toe of left foot ([place]) 04/24/2021   Renal cell carcinoma ([place])    Status post radical right nephrectomy August 2015   Sleep apnea    On CPAP, 4L O2 no cpap at home yet   Type 2 diabetes mellitus ([place]) 2011    Patient Active Problem List   Diagnosis Date Noted   History of complete ray amputation of first toe of left foot ([place]) 04/24/2021   Osteomyelitis of second toe of left foot ([place]) 04/24/2021   Diabetic foot ulcer ([place]) 12/24/2020   Benign prostatic hyperplasia 11/08/2020   Body mass index (BMI) 38.0-38.9, adult 11/08/2020   Chronic diastolic heart failure ([place]) 11/08/2020   Chronic pain 11/08/2020   Diabetic peripheral neuropathy associated with type 2 diabetes mellitus ([place]) 22/29/7989   Diastolic dysfunction 21/19/4174   Gastroesophageal reflux disease 11/08/2020   Hearing loss 11/08/2020   Incontinence of feces 11/08/2020   Long term (current) use of insulin ([place]) 11/08/2020   Loss of appetite 11/08/2020   Nicotine dependence, unspecified, uncomplicated 03/02/4817   Peripheral neuropathy 11/08/2020   Personal history of other malignant neoplasm of kidney 11/08/2020   Severe hyperglycemia due to diabetes mellitus ([place]) 11/08/2020  Urinary tract obstruction 11/08/2020   Asymptomatic varicose veins of bilateral lower extremities 11/08/2020   Sepsis ([place]) 05/19/2019   Sepsis with hypotension ([place]) 12/23/2018   Chronic respiratory failure with hypoxia ([place]) 12/23/2018   UTI symptoms 11/08/2018   Abdominal distention 11/08/2018   Chest pain 11/08/2018   Hyperglycemia 11/08/2018   AKI (acute kidney injury) ([place]) 26/94/8546   Acute metabolic encephalopathy 27/09/5007   Polypharmacy 06/03/2018   Diabetes mellitus due to underlying condition, uncontrolled, with hyperglycemia, with long-term current use of insulin ([place])  06/03/2018   Community acquired pneumonia 05/31/2018   Acute on chronic respiratory failure with hypoxia ([place]) 05/31/2018   Acute respiratory failure ([name])    Decreased mobility and endurance 07/19/2015   CKD (chronic kidney disease) stage 3, GFR 30-59 ml/min ([name]) 07/06/2015   Acute on chronic respiratory failure ([place]) 07/05/2015   COPD with acute exacerbation ([place]) 07/05/2015   Interstitial lung disease ([place]) 04/10/2015   Cough syncope 04/10/2015   Dyspnea 04/10/2015   Atypical chest pain 02/28/2015   Syncope 02/28/2015   Orthostatic dizziness 02/25/2015   Right leg pain 02/25/2015   Right calf pain 02/14/2015   Malaise and fatigue 02/14/2015   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[place]) 11/12/2014   Smoking 11/12/2014   History of stroke 10/24/2014   Carotid disease, bilateral ([place]) 10/24/2014   Orthostatic hypotension 10/24/2014   Obstructive sleep apnea    HLD (hyperlipidemia)    Acute on chronic renal failure ([name])    Renal cell carcinoma ([name])    Metabolic encephalopathy    Depression    Anxiety state    Noncompliance with medication regimen    Carotid artery stenosis 10/08/2014   Left sided numbness 09/26/2014   Hyperreflexia 09/26/2014   COLD (chronic obstructive lung disease) ([place]) 08/16/2014   ILD (interstitial lung disease) ([place]) 08/16/2014   Dizziness and giddiness 08/16/2014   Other specified hypotension 08/16/2014   Pulmonary edema    Other emphysema ([name])    Snoring 04/30/2014   Fluid overload 02/22/2014   Renal neoplasm 02/21/2014   Preoperative cardiovascular examination 02/07/2014   Renal mass, right 12/21/2013   Coronary atherosclerosis of native coronary artery    Substance abuse ([place])    Hyperlipidemia    Diabetes mellitus type 2, uncontrolled, with cardiovascular complications 38/18/2993   Tobacco abuse 06/01/2012   H/O medication noncompliance 06/01/2012   Cocaine abuse ([address]) 06/01/2012   Essential hypertension, benign     Past Surgical  History:  Procedure Laterality Date   AMPUTATION Left 12/27/2020   Procedure: PARTIAL FIRST RAY AMPUTATION OF FOOT;  Surgeon: Felipa Furnace, DPM;  Location: [place];  Service: Podiatry;  Laterality: Left;   APPENDECTOMY  1970's   CHOLECYSTECTOMY N/A 12/22/2013   Procedure: LAPAROSCOPIC CHOLECYSTECTOMY ;  Surgeon: [name], MD;  Location: [place];  Service: General;  Laterality: N/A;   ENDARTERECTOMY Right 10/08/2014   Procedure: Right ENDARTERECTOMY CAROTID;  Surgeon: [name], MD;  Location: [place];  Service: Vascular;  Laterality: Right;   LAPAROSCOPIC LYSIS OF ADHESIONS  02/21/2014   Procedure: LAPAROSCOPIC LYSIS OF ADHESIONS EXTINSIVE;  Surgeon: [name], MD;  Location: [facility];  Service: Urology;;   LEFT HEART CATHETERIZATION WITH CORONARY ANGIOGRAM N/A 06/02/2012   Procedure: LEFT HEART CATHETERIZATION WITH CORONARY ANGIOGRAM;  Surgeon: [name], MD;  Location[facility] CATH LAB;  Service: Cardiovascular;  Laterality: N/A;   LEFT HEART CATHETERIZATION WITH CORONARY ANGIOGRAM N/A 03/09/2013   Procedure: LEFT HEART CATHETERIZATION WITH CORONARY ANGIOGRAM;  Surgeon: Legrand Como  Burt Knack, MD;  Location: [facility] CATH LAB;  Service: Cardiovascular;  Laterality: N/A;   LEFT HEART CATHETERIZATION WITH CORONARY ANGIOGRAM N/A 12/20/2013   Procedure: LEFT HEART CATHETERIZATION WITH CORONARY ANGIOGRAM;  Surgeon: [name], MD;  Location: [facility] CATH LAB;  Service: Cardiovascular;  Laterality: N/A;   LUNG BIOPSY Left 05/18/2014   Procedure: LUNG BIOPSY left upper lobe & left lower lobe;  Surgeon: [name], MD;  Location: [place];  Service: Thoracic;  Laterality: Left;   PATCH ANGIOPLASTY Right 10/08/2014   Procedure: PATCH ANGIOPLASTY Right Carotid;  Surgeon: [name], MD;  Location: [place];  Service: Vascular;  Laterality: Right;   ROBOT ASSISTED LAPAROSCOPIC NEPHRECTOMY Right 02/21/2014   Procedure: ROBOTIC ASSISTED LAPAROSCOPIC RIGHT NEPHRECTOMY ;  Surgeon: [name], MD;   Location: [facility];  Service: Urology;  Laterality: Right;   VIDEO ASSISTED THORACOSCOPY Left 05/18/2014   Procedure: LEFT VIDEO ASSISTED THORACOSCOPY;  Surgeon: [name], MD;  Location: [place];  Service: Thoracic;  Laterality: Left;   VIDEO BRONCHOSCOPY  05/18/2014   Procedure: VIDEO BRONCHOSCOPY;  Surgeon: [name], MD;  Location: [facility];  Service: Thoracic;;       Family History  Problem Relation Age of Onset   Cancer Mother        Thyroid - living in her 10's.   Hypertension Other    Diabetes Other    Stroke Other    Lung cancer Father    CAD Father    Cancer Maternal Grandmother        Breast   Cancer Maternal Grandfather        Throat and stomach   CAD Brother     Social History   Tobacco Use   Smoking status: Every Day    Packs/day: 1.00    Years: 41.00    Pack years: 41.00    Types: Cigarettes    Start date: 05/03/1972   Smokeless tobacco: Never   Tobacco comments:    1/2 ppd 06/05/21  Vaping Use   Vaping Use: Never used  Substance Use Topics   Alcohol use: No    Alcohol/week: 0.0 standard drinks   Drug use: No    Types: Cocaine    Comment: last used cocaine 11/2013. but is cocaine + this admit July 2015    Home Medications Prior to Admission medications   Medication Sig Start Date End Date Taking? Authorizing Provider  acetaminophen (TYLENOL) 325 MG tablet Take 650 mg by mouth as needed (pain). 06/03/18   [provider]  ALPRAZolam Duanne Moron) 0.5 MG tablet Take 1 tablet (0.5 mg total) by mouth 3 (three) times daily as needed for anxiety. Patient not taking: Reported on 06/05/2021 06/03/18   Dhungel, Flonnie Overman, MD  ALPRAZolam Duanne Moron) 1 MG tablet Take 1 mg by mouth 3 (three) times daily as needed. 01/07/21   [provider]  atorvastatin (LIPITOR) 20 MG tablet Take 20 mg by mouth at bedtime. 04/26/18   [provider]  azithromycin (ZITHROMAX) 250 MG tablet Take 2 tablets first day then 1 tablet daily until gone  06/05/21   Brand Males, MD  Budeson-Glycopyrrol-Formoterol (BREZTRI AEROSPHERE) 160-9-4.8 MCG/ACT AERO Inhale 2 puffs into the lungs in the morning and at bedtime. 02/26/21   Brand Males, MD  budesonide (PULMICORT) 1 MG/2ML nebulizer solution Take 1 mg by nebulization as needed (shortness of breath). 01/16/20   [provider]  clopidogrel (PLAVIX) 75 MG tablet TAKE ONE TABLET BY MOUTH EVERY DAY 12/30/20  Satira Sark, MD  diltiazem [facility] CD) 180 MG 24 hr capsule TAKE ONE CAPSULE BY MOUTH EVERY DAY 12/30/20   Satira Sark, MD  diphenhydrAMINE (BENADRYL) 25 mg capsule Take 25 mg by mouth every 6 (six) hours as needed for itching (take with percocet).    [provider]  docusate sodium (COLACE) 100 MG capsule Take 200 mg by mouth at bedtime.     [provider]  doxycycline (VIBRA-TABS) 100 MG tablet Take 1 tablet (100 mg total) by mouth 2 (two) times daily. 06/18/21 07/18/21  Felipa Furnace, DPM  esomeprazole (NEXIUM) 40 MG capsule Take 40 mg by mouth at bedtime. Pt only take as needed    [provider]  FLUoxetine (PROZAC) 20 MG capsule Take by mouth. 01/03/21   [provider]  FLUoxetine (PROZAC) 40 MG capsule Take 40 mg by mouth daily. Take with 10mg  capsule for a total of 50mg  daily.    [provider]  formoterol (PERFOROMIST) 20 MCG/2ML nebulizer solution Take 20 mcg by nebulization as needed (shortness of breath). 01/16/20   [provider]  gabapentin (NEURONTIN) 300 MG capsule Take 900 mg by mouth 3 (three) times daily.    [provider]  glucose blood (TRUE METRIX BLOOD GLUCOSE TEST) test strip     [provider]  insulin regular human CONCENTRATED (HUMULIN R) 500 UNIT/ML injection Inject 90 Units into the skin 3 (three) times daily with meals.    [provider]  isosorbide mononitrate (IMDUR) 60 MG 24 hr tablet TAKE ONE TABLET BY MOUTH EVERY DAY 06/30/21   Satira Sark, MD  linagliptin (TRADJENTA) 5 MG TABS tablet Take 5 mg by mouth daily.    [provider]  nitroGLYCERIN (NITROSTAT) 0.4 MG SL tablet PLACE ONE tablet UNDER THE TONGUE every FIVE minutes as needed FOR CHEST PAIN. IF 3 TABLETS ARE NECESSARY CALL 911 05/07/21   Satira Sark, MD  oxybutynin (DITROPAN-XL) 10 MG 24 hr tablet Take 10 mg by mouth daily. 02/22/18   [provider]  potassium chloride SA (K-DUR,KLOR-CON) 20 MEQ tablet Take 20 mEq by mouth 2 (two) times daily.     [provider]  pregabalin (LYRICA) 25 MG capsule Take 25 mg by mouth 2 (two) times daily. 02/14/19   [provider]  Probiotic Product (PROBIOTIC PO) Take 1 capsule by mouth daily.    [provider]  sucralfate (CARAFATE) 1 g tablet TAKE ONE TABLET BY MOUTH TWICE DAILY ON an empty stomach 11/14/20   [provider]  tamsulosin (FLOMAX) 0.4 MG CAPS capsule Take 0.4 mg by mouth daily.     [provider]  torsemide (DEMADEX) 20 MG tablet TAKE THREE TABLETS BY MOUTH TWICE DAILY 06/26/21   Satira Sark, MD  zolpidem (AMBIEN) 5 MG tablet Take 5 mg by mouth at bedtime. For insomnia. 03/03/19   [provider]    Allergies    Lisinopril and Oxycodone  Review of Systems   Review of Systems  All other systems reviewed and are negative.  Physical Exam Updated Vital Signs BP (!) 132/96    Pulse 88    Temp 98.6 F (37 C) (Oral)    Resp 20    SpO2 98%   Physical Exam Vitals and nursing note reviewed.  Constitutional:      General: He is not in acute distress.    Appearance: He is well-developed.  HENT:     Head: Normocephalic and  atraumatic.     Mouth/Throat:     Pharynx: No oropharyngeal exudate.  Eyes:     General: No scleral icterus.       Right eye: No discharge.        Left eye: No discharge.     Conjunctiva/sclera: Conjunctivae normal.     Pupils: Pupils are equal, round, and reactive to light.  Neck:     Thyroid: No thyromegaly.      Vascular: No JVD.  Cardiovascular:     Rate and Rhythm: Normal rate and regular rhythm.     Heart sounds: Normal heart sounds. No murmur heard.   No friction rub. No gallop.  Pulmonary:     Effort: Pulmonary effort is normal. No respiratory distress.     Breath sounds: Wheezing present. No rales.  Abdominal:     General: Bowel sounds are normal. There is no distension.     Palpations: Abdomen is soft. There is no mass.     Tenderness: There is no abdominal tenderness.  Musculoskeletal:        General: No tenderness. Normal range of motion.     Cervical back: Normal range of motion and neck supple.  Lymphadenopathy:     Cervical: No cervical adenopathy.  Skin:    General: Skin is warm and dry.     Findings: No erythema or rash.     Comments: The wound at the tip of the left foot appears to be dehisced, there is packing in place with a foul smell, no significant redness of the foot  Neurological:     Mental Status: He is alert.     Coordination: Coordination normal.  Psychiatric:        Behavior: Behavior normal.    ED Results / Procedures / Treatments   Labs (all labs ordered are listed, but only abnormal results are displayed) Labs Reviewed  COMPREHENSIVE METABOLIC PANEL - Abnormal; Notable for the following components:      Result Value   Glucose, Bld 290 (*)    Creatinine, Ser 1.27 (*)    Albumin 3.0 (*)    AST 12 (*)    All other components within normal limits  CBC WITH DIFFERENTIAL/PLATELET - Abnormal; Notable for the following components:   Hemoglobin 12.9 (*)    All other components within normal limits  CULTURE, BLOOD (ROUTINE X 2)  CULTURE, BLOOD (ROUTINE X 2)  RESP PANEL BY RT-PCR (FLU A&B, COVID) ARPGX2  LACTIC ACID, PLASMA  PROTIME-INR  APTT  LACTIC ACID, PLASMA  URINALYSIS, ROUTINE W REFLEX MICROSCOPIC    EKG None  Radiology DG Foot Complete Left  Result Date: 07/15/2021 CLINICAL DATA:  Pain in left foot.  Status post forefoot amputation.  EXAM: LEFT FOOT - COMPLETE 3+ VIEW COMPARISON:  06/18/2021 FINDINGS: The patient is status post forefoot amputation at the level of the mid shaft of the metatarsal bones. The surgical margin of the second metatarsal bone is irregular with underlying erosive changes which is concerning for osteomyelitis. Bony fragmentation and exuberant periosteal reaction is identified. There is also irregularity along the surgical margin of the fifth metatarsal bone. Diffuse soft tissue swelling appears increased from previous exam. No definite soft tissue gas identified. Unchanged appearance of fracture deformity at the base of the fifth metatarsal bone. IMPRESSION: 1. Findings concerning for osteomyelitis involving the surgical margin of the second and fifth metatarsal bones. 2. Increase in diffuse soft tissue swelling. No soft tissue gas identified. 3. Stable appearance of fracture deformity at  the base of the fifth metatarsal bone. Electronically Signed   By: Kerby Moors M.D.   On: 07/15/2021 13:21    Procedures Procedures   Medications Ordered in ED Medications - No data to display  ED Course  I have reviewed the triage vital signs and the nursing notes.  Pertinent labs & imaging results that were available during my care of the patient were reviewed by me and considered in my medical decision making (see chart for details).    MDM Rules/Calculators/A&P                          This patient presents to the ED for concern of osteomyelitis or other infections of the foot, this involves an extensive number of treatment options, and is a complaint that carries with it a high risk of complications and morbidity.  The differential diagnosis includes osteomyelitis, cellulitis, abscess   Additional history obtained:  Additional history obtained from electronic medical record as well as discussion with the on-call podiatrist External records from outside source obtained and reviewed including the patient  having prior history of osteomyelitis and now worsening symptoms   Lab Tests:  I Ordered, reviewed, and interpreted labs.  The pertinent results include: No leukocytosis, renal dysfunction but better than baseline   Imaging Studies ordered:  I ordered imaging studies including x-ray of the foot I independently visualized and interpreted imaging which showed osteomyelitis of the tips of some of the metacarpal I agree with the radiologist interpretation   Cardiac Monitoring:  The patient was maintained on a cardiac monitor.  I personally viewed and interpreted the cardiac monitored which showed an underlying rhythm of: Normal sinus rhythm  I discussed with the consultant regarding the use of medications but they state they do not want him to have antibiotics tonight, given no white blood cell count and no sepsis syndrome they would like to wait until cultures are obtained during surgery according to Dr. Sherryle Lis   Critical Interventions:  Evaluation for osteomyelitis Labs and admission Consultation with specialist   Consultations Obtained:  I requested consultation with the podiatrist,  and discussed lab and imaging findings as well as pertinent plan - they recommend: Admission to the hospital to hospitalist   Problem List / ED Course:  Osteomyelitis, patient will need antibiotics once cultures occur however podiatrist has requested that I do not do this yet.  They would like to do surgery first Diabetes, will need to be on his home medications to have optimal control of his blood sugar Hypertension noted at 132/96, home medication COPD, continue home medications, not in distress at this time, he does need supplemental oxygen   Reevaluation:  After the interventions noted above, I reevaluated the patient and found that they have :stayed the same   Dispostion:  After consideration of the diagnostic results and the patients response to treatment feel that the patent would  benefit from admission to the hospital, hospitalist paged. They will admit      Final Clinical Impression(s) / ED Diagnoses Final diagnoses:  Osteomyelitis of foot, left, acute ([place])  Other specified diabetes mellitus with foot ulcer, with long-term current use of insulin ([place])  Essential hypertension  Chronic obstructive pulmonary disease, unspecified COPD type ([place])     Noemi Chapel, MD 07/15/21 2126

## 2021-07-15 NOTE — ED Provider Notes (Signed)
Emergency Medicine Provider Triage Evaluation Note  [name] , a 65 y.o. male  was evaluated in triage.  Pt complains of left foot wound.  States that he was told to come the emergency department in 12/21 by Dr. Posey Pronto his podiatrist for progressively worsening left foot wound following amputation of the toes in June of this year in context of poorly controlled diabetes.  Was directed to the ED by podiatry for IV antibiotics and admission to the hospital.  Patient requested to hold off on presentation until after Christmas, which is why he presents today..  Review of Systems  Positive: Left foot wound Negative: Fevers, chills, nausea, vomiting  Physical Exam  BP (!) 115/92 (BP Location: Right Arm)    Pulse 95    Temp 98.6 F (37 C) (Oral)    Resp 16    SpO2 98%  Gen:   Awake, no distress   Resp:  Normal effort, wheezing throughout the lung fields bilaterally.  On oxygen with nasal cannula at his baseline.  MSK:   Moves extremities without difficulty  Other:  Patient mild large boot on left foot, requesting boot and dressing not be removed until he is in a room. RRR no M/R/G.  Patient is nontoxic-appearing.  Medical Decision Making  Medically screening exam initiated at 12:45 PM.  Appropriate orders placed.  [name] was informed that the remainder of the evaluation will be completed by another provider, this initial triage assessment does not replace that evaluation, and the importance of remaining in the ED until their evaluation is complete.  Per chart review, podiatry planning further amputation of the foot.  Work-up ordered. This chart was dictated using voice recognition software, Dragon. Despite the best efforts of this provider to proofread and correct errors, errors may still occur which can change documentation meaning.    Aura Dials 07/15/21 1247    Truddie Hidden, MD 07/15/21 1434

## 2021-07-15 NOTE — ED Triage Notes (Signed)
Pt reports was sent here for admission for L foot amputation. Had toes amputated in June and is followed by the wound care center and Dr. Posey Pronto who sent him here.

## 2021-07-16 ENCOUNTER — Encounter (HOSPITAL_COMMUNITY): Payer: Self-pay | Admitting: Internal Medicine

## 2021-07-16 ENCOUNTER — Inpatient Hospital Stay (HOSPITAL_COMMUNITY): Payer: Medicare Other

## 2021-07-16 DIAGNOSIS — L97529 Non-pressure chronic ulcer of other part of left foot with unspecified severity: Secondary | ICD-10-CM

## 2021-07-16 DIAGNOSIS — M86472 Chronic osteomyelitis with draining sinus, left ankle and foot: Secondary | ICD-10-CM

## 2021-07-16 DIAGNOSIS — E1151 Type 2 diabetes mellitus with diabetic peripheral angiopathy without gangrene: Secondary | ICD-10-CM

## 2021-07-16 DIAGNOSIS — I10 Essential (primary) hypertension: Secondary | ICD-10-CM

## 2021-07-16 DIAGNOSIS — J449 Chronic obstructive pulmonary disease, unspecified: Secondary | ICD-10-CM

## 2021-07-16 DIAGNOSIS — Z89432 Acquired absence of left foot: Secondary | ICD-10-CM

## 2021-07-16 DIAGNOSIS — I739 Peripheral vascular disease, unspecified: Secondary | ICD-10-CM

## 2021-07-16 DIAGNOSIS — I70245 Atherosclerosis of native arteries of left leg with ulceration of other part of foot: Secondary | ICD-10-CM

## 2021-07-16 DIAGNOSIS — M869 Osteomyelitis, unspecified: Secondary | ICD-10-CM

## 2021-07-16 LAB — HEMOGLOBIN A1C
Hgb A1c MFr Bld: 10.4 % — ABNORMAL HIGH (ref 4.8–5.6)
Mean Plasma Glucose: 251.78 mg/dL

## 2021-07-16 LAB — CBG MONITORING, ED
Glucose-Capillary: 186 mg/dL — ABNORMAL HIGH (ref 70–99)
Glucose-Capillary: 223 mg/dL — ABNORMAL HIGH (ref 70–99)

## 2021-07-16 LAB — GLUCOSE, CAPILLARY
Glucose-Capillary: 210 mg/dL — ABNORMAL HIGH (ref 70–99)
Glucose-Capillary: 289 mg/dL — ABNORMAL HIGH (ref 70–99)
Glucose-Capillary: 246 mg/dL — ABNORMAL HIGH (ref 70–99)

## 2021-07-16 MED ORDER — BUDESON-GLYCOPYRROL-FORMOTEROL 160-9-4.8 MCG/ACT IN AERO: 2.0000 | INHALATION_SPRAY | Freq: Two times a day (BID) | RESPIRATORY_TRACT | Status: DC

## 2021-07-16 MED ORDER — ATORVASTATIN CALCIUM 10 MG PO TABS
20.0000 mg | ORAL_TABLET | Freq: Every day | ORAL | Status: DC
  Administered 2021-07-16 – 2021-07-17 (×3): 20 mg via ORAL
  Filled 2021-07-16 (×3): qty 2

## 2021-07-16 MED ORDER — FLUTICASONE FUROATE-VILANTEROL 200-25 MCG/ACT IN AEPB
1.0000 | INHALATION_SPRAY | Freq: Every day | RESPIRATORY_TRACT | Status: DC
  Administered 2021-07-16: 08:00:00 1 via RESPIRATORY_TRACT
  Filled 2021-07-16 (×3): qty 28

## 2021-07-16 MED ORDER — INSULIN ASPART 100 UNIT/ML IJ SOLN
0.0000 [IU] | INTRAMUSCULAR | Status: DC
  Administered 2021-07-16 (×2): 3 [IU] via SUBCUTANEOUS
  Administered 2021-07-16: 18:00:00 5 [IU] via SUBCUTANEOUS
  Administered 2021-07-16: 01:00:00 2 [IU] via SUBCUTANEOUS
  Administered 2021-07-16 (×2): 3 [IU] via SUBCUTANEOUS
  Administered 2021-07-17: 21:00:00 7 [IU] via SUBCUTANEOUS
  Administered 2021-07-17 (×3): 3 [IU] via SUBCUTANEOUS
  Administered 2021-07-17 – 2021-07-18 (×2): 5 [IU] via SUBCUTANEOUS
  Administered 2021-07-18 (×2): 3 [IU] via SUBCUTANEOUS

## 2021-07-16 MED ORDER — ALBUTEROL SULFATE (2.5 MG/3ML) 0.083% IN NEBU: 2.5000 mg | INHALATION_SOLUTION | Freq: Four times a day (QID) | RESPIRATORY_TRACT | Status: DC | PRN

## 2021-07-16 MED ORDER — UMECLIDINIUM BROMIDE 62.5 MCG/ACT IN AEPB
1.0000 | INHALATION_SPRAY | Freq: Every day | RESPIRATORY_TRACT | Status: DC
  Administered 2021-07-16: 08:00:00 1 via RESPIRATORY_TRACT
  Filled 2021-07-16 (×2): qty 7

## 2021-07-16 MED ORDER — GABAPENTIN 300 MG PO CAPS
900.0000 mg | ORAL_CAPSULE | Freq: Three times a day (TID) | ORAL | Status: DC
  Administered 2021-07-16 – 2021-07-18 (×6): 900 mg via ORAL
  Filled 2021-07-16 (×6): qty 3

## 2021-07-16 MED ORDER — DILTIAZEM HCL ER COATED BEADS 180 MG PO CP24
180.0000 mg | ORAL_CAPSULE | Freq: Every day | ORAL | Status: DC
  Administered 2021-07-16 – 2021-07-18 (×3): 180 mg via ORAL
  Filled 2021-07-16 (×4): qty 1

## 2021-07-16 MED ORDER — TAMSULOSIN HCL 0.4 MG PO CAPS
0.4000 mg | ORAL_CAPSULE | Freq: Every day | ORAL | Status: DC
  Administered 2021-07-16 – 2021-07-18 (×3): 0.4 mg via ORAL
  Filled 2021-07-16 (×3): qty 1

## 2021-07-16 MED ORDER — ACETAMINOPHEN 650 MG RE SUPP: 650.0000 mg | Freq: Four times a day (QID) | RECTAL | Status: DC | PRN

## 2021-07-16 MED ORDER — ALPRAZOLAM 0.5 MG PO TABS
1.0000 mg | ORAL_TABLET | Freq: Three times a day (TID) | ORAL | Status: DC | PRN
  Administered 2021-07-16: 22:00:00 1 mg via ORAL
  Filled 2021-07-16: qty 2

## 2021-07-16 MED ORDER — PANTOPRAZOLE SODIUM 40 MG PO TBEC
40.0000 mg | DELAYED_RELEASE_TABLET | Freq: Every day | ORAL | Status: DC
  Administered 2021-07-16 – 2021-07-18 (×3): 40 mg via ORAL
  Filled 2021-07-16 (×3): qty 1

## 2021-07-16 MED ORDER — PREGABALIN 25 MG PO CAPS
25.0000 mg | ORAL_CAPSULE | Freq: Two times a day (BID) | ORAL | Status: DC
  Administered 2021-07-16 – 2021-07-18 (×6): 25 mg via ORAL
  Filled 2021-07-16 (×6): qty 1

## 2021-07-16 MED ORDER — ACETAMINOPHEN 325 MG PO TABS
650.0000 mg | ORAL_TABLET | Freq: Four times a day (QID) | ORAL | Status: DC | PRN
  Administered 2021-07-16: 22:00:00 650 mg via ORAL
  Filled 2021-07-16 (×2): qty 2

## 2021-07-16 MED ORDER — ISOSORBIDE MONONITRATE ER 60 MG PO TB24
60.0000 mg | ORAL_TABLET | Freq: Every day | ORAL | Status: DC
  Administered 2021-07-16 – 2021-07-17 (×2): 60 mg via ORAL
  Filled 2021-07-16 (×3): qty 1

## 2021-07-16 NOTE — Progress Notes (Signed)
Inpatient Diabetes Program Recommendations  AACE/ADA: New Consensus Statement on Inpatient Glycemic Control (2015)  Target Ranges:  Prepandial:   less than 140 mg/dL      Peak postprandial:   less than 180 mg/dL (1-2 hours)      Critically ill patients:  140 - 180 mg/dL   Lab Results  Component Value Date   GLUCAP 210 (H) 07/16/2021   HGBA1C 10.4 (H) 07/15/2021    Review of Glycemic Control  Latest Reference Range & Units 07/16/21 00:35 07/16/21 08:12 07/16/21 11:15  Glucose-Capillary 70 - 99 mg/dL 186 (H) 223 (H) 210 (H)  (H): Data is abnormally high Diabetes history: Type 2 DM Outpatient Diabetes medications: U-500 90 units TID, Tradjenta 5 mg QD Current orders for Inpatient glycemic control: Novolog 0-9 units Q4H  Inpatient Diabetes Program Recommendations:    Consider adding Semglee 15 units QD.  Will follow. Thanks, Bronson Curb, MSN, RNC-OB Diabetes Coordinator [phone] (8a-5p)

## 2021-07-16 NOTE — Progress Notes (Signed)
ABI's have been completed. Preliminary results can be found in CV Proc through chart review.   07/16/21 12:41 PM [name] RVT

## 2021-07-16 NOTE — Progress Notes (Signed)
PROGRESS NOTE  [name] HYI:[id number] DOB: 27-Feb-1956 DOA: 07/15/2021 PCP: Carolee Rota, NP   LOS: 1 day   Brief Narrative / Interim history:  65 y.o. male with medical history significant of COPD, chronic hypoxic respiratory failure on home oxygen, polysubstance abuse (tobacco and cocaine), CAD status post PCI, anxiety, depression, BPH, hypertension, GERD, CVA, hyperlipidemia, renal cell carcinoma status post radical nephrectomy in 2015, OSA, medical noncompliance, poorly controlled insulin-dependent type 2 diabetes, status post transmetatarsal amputation of the left foot due to osteomyelitis followed by Dr. Posey Pronto from podiatry.  On 12/21 he was seen in office and recommended to be admitted for IV antibiotics as well as surgical management but patient refused due to the holidays.  He presented and was admitted on 12/27.  Podiatry consulted.  Subjective / 24h Interval events: Doing well, no significant complaints.  Denies any fever or chills.    Assessment & Plan: Principal Problem Acute on chronic osteomyelitis, foot abscess-MRI done this admission showed osteomyelitis of the second, third, fourth and fifth metatarsals.  There is also a small 9 mm fluid collection along the plantar aspect of the third metatarsal stump concerning for abscess.  Podiatry consulted, may undergo surgery later today.  Currently monitor off antibiotics to increase intraoperative culture yield.  He is afebrile and does not have a leukocytosis.  Active Problems Poorly controlled insulin-dependent type 2 diabetes, with hyperglycemia -History of medical noncompliance.  A1c 10.4.  Continue sliding scale  CBG (last 3)  Recent Labs    07/16/21 0035 07/16/21 0812 07/16/21 1115  GLUCAP 186* 223* 210*   Diabetic neuropathy -Continue gabapentin and pregabalin   COPD, chronic hypoxic respiratory failure -No signs of acute exacerbation.  Stable on 6 L home oxygen.  Continue home regimen   CAD status post PCI  -Stable.  No chest pain.  Hold Plavix in anticipation for surgery   Hypertension-Continue Cardizem, Imdur   Hyperlipidemia -Continue Lipitor   Anxiety -Continue Xanax as needed   GERD -Continue PPI   BPH -Continue Flomax  Scheduled Meds:  atorvastatin  20 mg Oral QHS   diltiazem  180 mg Oral Daily   fluticasone furoate-vilanterol  1 puff Inhalation Daily   gabapentin  900 mg Oral TID   insulin aspart  0-9 Units Subcutaneous Q4H   isosorbide mononitrate  60 mg Oral Daily   pantoprazole  40 mg Oral Daily   pregabalin  25 mg Oral BID   tamsulosin  0.4 mg Oral Daily   umeclidinium bromide  1 puff Inhalation Daily   Continuous Infusions: PRN Meds:.acetaminophen **OR** acetaminophen, albuterol, ALPRAZolam  Diet Orders (From admission, onward)     Start     Ordered   07/16/21 0001  Diet NPO time specified  Diet effective midnight        07/15/21 2223            DVT prophylaxis: SCDs Start: 07/16/21 0013     Code Status: DNR  Family Communication: no family at bedside   Status is: Inpatient  Remains inpatient appropriate because: pending surgery   Level of care: Med-Surg  Consultants:  Podiatry   Procedures:  none  Microbiology  none  Antimicrobials: none    Objective: Vitals:   07/16/21 0630 07/16/21 0730 07/16/21 0800 07/16/21 0905  BP: (!) 175/95 (!) 143/72 (!) 156/82 (!) 174/98  Pulse: 75 72 74 91  Resp: 17 18 17 16   Temp:    97.8 F (36.6 C)  TempSrc:    Oral  SpO2: 92% 94% 92% 95%   No intake or output data in the 24 hours ending 07/16/21 1133 There were no vitals filed for this visit.  Examination:  Constitutional: NAD Eyes: no scleral icterus ENMT: Mucous membranes are moist.  Neck: normal, supple Respiratory: Diminished breath sounds, clear to auscultation bilaterally, no wheezing, no crackles. Normal respiratory effort. Cardiovascular: Regular rate and rhythm, no murmurs / rubs / gallops. No LE edema.  Abdomen: non distended, no  tenderness. Bowel sounds positive.  Musculoskeletal: no clubbing / cyanosis.  Skin: no rashes Neurologic: non focal   Data Reviewed: I have independently reviewed following labs and imaging studies   CBC: Recent Labs  Lab 07/15/21 1245  WBC 7.6  NEUTROABS 5.8  HGB 12.9*  HCT 40.3  MCV 84.0  PLT 956   Basic Metabolic Panel: Recent Labs  Lab 07/15/21 1245  NA 136  K 3.9  CL 102  CO2 25  GLUCOSE 290*  BUN 8  CREATININE 1.27*  CALCIUM 9.8   Liver Function Tests: Recent Labs  Lab 07/15/21 1245  AST 12*  ALT 13  ALKPHOS 68  BILITOT 0.3  PROT 7.3  ALBUMIN 3.0*   Coagulation Profile: Recent Labs  Lab 07/15/21 1245  INR 1.0   HbA1C: Recent Labs    07/15/21 1245  HGBA1C 10.4*   CBG: Recent Labs  Lab 07/16/21 0035 07/16/21 0812 07/16/21 1115  GLUCAP 186* 223* 210*    Recent Results (from the past 240 hour(s))  Blood Culture (routine x 2)     Status: None (Preliminary result)   Collection Time: 07/15/21  9:20 PM   Specimen: BLOOD  Result Value Ref Range Status   Specimen Description BLOOD SITE NOT SPECIFIED  Final   Special Requests   Final    BOTTLES DRAWN AEROBIC AND ANAEROBIC Blood Culture adequate volume   Culture   Final    NO GROWTH < 12 HOURS Performed at [facility], [place] [address] [place], [place] [address]    Report Status PENDING  Incomplete  Blood Culture (routine x 2)     Status: None (Preliminary result)   Collection Time: 07/15/21  9:30 PM   Specimen: BLOOD  Result Value Ref Range Status   Specimen Description BLOOD SITE NOT SPECIFIED  Final   Special Requests   Final    BOTTLES DRAWN AEROBIC AND ANAEROBIC Blood Culture adequate volume   Culture   Final    NO GROWTH < 12 HOURS Performed at [facility], [place] [address], [place], [place] [address]    Report Status PENDING  Incomplete  Resp Panel by RT-PCR (Flu A&B, Covid)     Status: None   Collection Time: 07/15/21  9:37 PM   Specimen: Nasopharyngeal(NP) swabs  in vial transport medium  Result Value Ref Range Status   SARS Coronavirus 2 by RT PCR NEGATIVE NEGATIVE Final    Comment: (NOTE) SARS-CoV-2 target nucleic acids are NOT DETECTED.  The SARS-CoV-2 RNA is generally detectable in upper respiratory specimens during the acute phase of infection. The lowest concentration of SARS-CoV-2 viral copies this assay can detect is 138 copies/mL. A negative result does not preclude SARS-Cov-2 infection and should not be used as the sole basis for treatment or other patient management decisions. A negative result may occur with  improper specimen collection/handling, submission of specimen other than nasopharyngeal swab, presence of viral mutation(s) within the areas targeted by this assay, and inadequate number of viral copies(<138 copies/mL). A negative  result must be combined with clinical observations, patient history, and epidemiological information. The expected result is Negative.  Fact Sheet for Patients:  EntrepreneurPulse.com.au  Fact Sheet for Healthcare Providers:  IncredibleEmployment.be  This test is no t yet approved or cleared by the [place] FDA and  has been authorized for detection and/or diagnosis of SARS-CoV-2 by FDA under an Emergency Use Authorization (EUA). This EUA will remain  in effect (meaning this test can be used) for the duration of the COVID-19 declaration under Section 564(b)(1) of the Act, 21 U.S.C.section 360bbb-3(b)(1), unless the authorization is terminated  or revoked sooner.       Influenza A by PCR NEGATIVE NEGATIVE Final   Influenza B by PCR NEGATIVE NEGATIVE Final    Comment: (NOTE) The Xpert Xpress SARS-CoV-2/FLU/RSV plus assay is intended as an aid in the diagnosis of influenza from Nasopharyngeal swab specimens and should not be used as a sole basis for treatment. Nasal washings and aspirates are unacceptable for Xpert Xpress  SARS-CoV-2/FLU/RSV testing.  Fact Sheet for Patients: EntrepreneurPulse.com.au  Fact Sheet for Healthcare Providers: IncredibleEmployment.be  This test is not yet approved or cleared by the [place] FDA and has been authorized for detection and/or diagnosis of SARS-CoV-2 by FDA under an Emergency Use Authorization (EUA). This EUA will remain in effect (meaning this test can be used) for the duration of the COVID-19 declaration under Section 564(b)(1) of the Act, 21 U.S.C. section 360bbb-3(b)(1), unless the authorization is terminated or revoked.  Performed at [facility], [place] [name], [place], [place] [address]      Radiology Studies: MR FOOT LEFT WO CONTRAST  Result Date: 07/16/2021 CLINICAL DATA:  Foot pain and swelling. EXAM: MRI OF THE LEFT FOOT WITHOUT CONTRAST TECHNIQUE: Multiplanar, multisequence MR imaging of the left foot was performed. No intravenous contrast was administered. COMPARISON:  None. FINDINGS: Bones/Joint/Cartilage Transmetatarsal amputation of the left foot. Irregularity at the amputation site of the metatarsals with bone marrow edema extending into the proximal shaft most severe involving the second, third, fourth and fifth metatarsals. 9 mm fluid collection along the plantar aspect of the third metatarsal stump concerning for an abscess. Small sliver of fluid along the plantar aspect of the stump of the fourth metatarsal also concerning for an abscess. Normal alignment. No joint effusion. Small full-thickness cartilage fissure of the medial corner of the talar dome with subchondral reactive marrow edema. Ligaments Collateral ligaments are intact.  Lisfranc ligament is intact. Muscles and Tendons Flexor, peroneal and extensor compartment tendons are intact. Generalized muscle atrophy. Soft tissue No other fluid collection or hematoma. No soft tissue mass. Soft tissue wound along the distal stump of the amputation  site. IMPRESSION: 1. Transmetatarsal amputation of the left foot. Irregularity at the amputation site of the metatarsals with bone marrow edema extending into the proximal shaft most severe involving the second, third, fourth and fifth metatarsals. Overall appearance is most consistent with osteomyelitis. 2. A 9 mm fluid collection along the plantar aspect of the third metatarsal stump concerning for an abscess. Small sliver of fluid along the plantar aspect of the stump of the fourth metatarsal also concerning for an abscess. Electronically Signed   By: Kathreen Devoid M.D.   On: 07/16/2021 07:23   DG Foot Complete Left  Result Date: 07/15/2021 CLINICAL DATA:  Pain in left foot.  Status post forefoot amputation. EXAM: LEFT FOOT - COMPLETE 3+ VIEW COMPARISON:  06/18/2021 FINDINGS: The patient is status post forefoot amputation at the level of  the mid shaft of the metatarsal bones. The surgical margin of the second metatarsal bone is irregular with underlying erosive changes which is concerning for osteomyelitis. Bony fragmentation and exuberant periosteal reaction is identified. There is also irregularity along the surgical margin of the fifth metatarsal bone. Diffuse soft tissue swelling appears increased from previous exam. No definite soft tissue gas identified. Unchanged appearance of fracture deformity at the base of the fifth metatarsal bone. IMPRESSION: 1. Findings concerning for osteomyelitis involving the surgical margin of the second and fifth metatarsal bones. 2. Increase in diffuse soft tissue swelling. No soft tissue gas identified. 3. Stable appearance of fracture deformity at the base of the fifth metatarsal bone. Electronically Signed   By: Kerby Moors M.D.   On: 07/15/2021 13:21     Marzetta Board, MD, PhD [facility]  Between 7 am - 7 pm I am available, please contact me via Amion (for emergencies) or Securechat (non urgent messages)  Between 7 pm - 7 am I am not available,  please contact night coverage MD/APP via Amion

## 2021-07-16 NOTE — ED Notes (Signed)
Pt's CBG 213 at 0459. Result not crossing over into Epic.

## 2021-07-16 NOTE — Consult Note (Signed)
Reason for Consult: Wound infection, osteomyelitis Referring Physician: Dr. Josie Saunders is an 65 y.o. male.  HPI: 65 year old male with a history of type 2 diabetes that is poorly controlled and peripheral arterial disease who previously underwent transmetatarsal amputation and wound revision most recently in October 2022 by our partner Dr. Posey Pronto.  Has had persistent difficulty healing the wound.  His A1c remains highly elevated.  With worsening the wound Dr. Posey Pronto recommended admission and surgical intervention.  Past Medical History:  Diagnosis Date   Anxiety    Arthritis    Cholecystitis, acute 12/20/2013   Lap chole 6/5   Cocaine abuse ([name])    COPD (chronic obstructive pulmonary disease) ([name])    Coronary atherosclerosis of native coronary artery    a. 03/09/2013 Cath/PCI: LM nl, LAD: 50p, 67m (2.5x16 promus DES), LCX nl, OM1 min irregs, LPL/LPDA diff dzs, RCA nondom, mod diff dzs, EF 55%.   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 ([place]) 04/24/2021   History of nephrolithiasis    History of pneumonia    History of stroke    Right MCA distribution, residual left-sided weakness   Hyperlipidemia    Noncompliance    Osteomyelitis of second toe of left foot ([place]) 04/24/2021   Renal cell carcinoma ([place])    Status post radical right nephrectomy August 2015   Sleep apnea    On CPAP, 4L O2 no cpap at home yet   Type 2 diabetes mellitus ([place]) 2011    Past Surgical History:  Procedure Laterality Date   AMPUTATION Left 12/27/2020   Procedure: PARTIAL FIRST RAY AMPUTATION OF FOOT;  Surgeon: Felipa Furnace, DPM;  Location: [place];  Service: Podiatry;  Laterality: Left;   APPENDECTOMY  1970's   CHOLECYSTECTOMY N/A 12/22/2013   Procedure: LAPAROSCOPIC CHOLECYSTECTOMY ;  Surgeon: [name], MD;  Location: [place];  Service: General;   Laterality: N/A;   ENDARTERECTOMY Right 10/08/2014   Procedure: Right ENDARTERECTOMY CAROTID;  Surgeon: [name], MD;  Location: [place];  Service: Vascular;  Laterality: Right;   LAPAROSCOPIC LYSIS OF ADHESIONS  02/21/2014   Procedure: LAPAROSCOPIC LYSIS OF ADHESIONS EXTINSIVE;  Surgeon: [name], MD;  Location[facility];  Service: Urology;;   LEFT HEART CATHETERIZATION WITH CORONARY ANGIOGRAM N/A 06/02/2012   Procedure: LEFT HEART CATHETERIZATION WITH CORONARY ANGIOGRAM;  Surgeon: [name], MD;  [facility] C[facility] LAB;  Service: Cardiovascular;  Laterality: N/A;   LEFT HEART CATHETERIZATION WITH CORONARY ANGIOGRAM N/A 03/09/2013   Procedure: LEFT HEART CATHETERIZATION WITH CORONARY ANGIOGRAM;  Surgeon: [name], MD;  Location: [facility] CATH LAB;  Service: Cardiovascular;  Laterality: N/A;   LEFT HEART CATHETERIZATION WITH CORONARY ANGIOGRAM N/A 12/20/2013   Procedure: LEFT HEART CATHETERIZATION WITH CORONARY ANGIOGRAM;  Surgeon: [name], MD;  Location: [facility] CATH LAB;  Service: Cardiovascular;  Laterality: N/A;   LUNG BIOPSY Left 05/18/2014   Procedure: LUNG BIOPSY left upper lobe & left lower lobe;  Surgeon: [name], MD;  Location: [place];  Service: Thoracic;  Laterality: Left;   PATCH ANGIOPLASTY Right 10/08/2014   Procedure: PATCH ANGIOPLASTY Right Carotid;  Surgeon: [name], MD;  Location: [place];  Service: Vascular;  Laterality: Right;   ROBOT ASSISTED LAPAROSCOPIC NEPHRECTOMY Right 02/21/2014   Procedure: ROBOTIC ASSISTED LAPAROSCOPIC RIGHT NEPHRECTOMY ;  Surgeon: [name], MD;  Location[facility];  Service: Urology;  Laterality: Right;   VIDEO ASSISTED THORACOSCOPY Left 05/18/2014   Procedure: LEFT VIDEO ASSISTED THORACOSCOPY;  Surgeon: [name], MD;  Location: [place];  Service: Thoracic;  Laterality: Left;   VIDEO BRONCHOSCOPY  05/18/2014   Procedure: VIDEO BRONCHOSCOPY;  Surgeon: [name], MD;  Location: [facility];  Service:  Thoracic;;    Family History  Problem Relation Age of Onset   Cancer Mother        Thyroid - living in her 8's.   Hypertension Other    Diabetes Other    Stroke Other    Lung cancer Father    CAD Father    Cancer Maternal Grandmother        Breast   Cancer Maternal Grandfather        Throat and stomach   CAD Brother     Social History:  reports that he has been smoking cigarettes. He started smoking about 49 years ago. He has a 41.00 pack-year smoking history. He has never used smokeless tobacco. He reports that he does not drink alcohol and does not use drugs.  Allergies:  Allergies  Allergen Reactions   Lisinopril Other (See Comments)    Other reaction(s): worsening kidney function   Oxycodone Itching and Nausea Only    Pt is able to tolerate if taken with benadryl    Medications: I have reviewed the patient's current medications.  Results for orders placed or performed during the hospital encounter of 07/15/21 (from the past 48 hour(s))  Lactic acid, plasma     Status: None   Collection Time: 07/15/21 12:45 PM  Result Value Ref Range   Lactic Acid, Venous 1.8 0.5 - 1.9 mmol/L    Comment: Performed at [facility], [address], [place], [place] [address]  Comprehensive metabolic panel     Status: Abnormal   Collection Time: 07/15/21 12:45 PM  Result Value Ref Range   Sodium 136 135 - 145 mmol/L   Potassium 3.9 3.5 - 5.1 mmol/L   Chloride 102 98 - 111 mmol/L   CO2 25 22 - 32 mmol/L   Glucose, Bld 290 (H) 70 - 99 mg/dL    Comment: Glucose reference range applies only to samples taken after fasting for at least 8 hours.   BUN 8 8 - 23 mg/dL   Creatinine, Ser 1.27 (H) 0.61 - 1.24 mg/dL   Calcium 9.8 8.9 - 10.3 mg/dL   Total Protein 7.3 6.5 - 8.1 g/dL   Albumin 3.0 (L) 3.5 - 5.0 g/dL   AST 12 (L) 15 - 41 U/L   ALT 13 0 - 44 U/L   Alkaline Phosphatase 68 38 - 126 U/L   Total Bilirubin 0.3 0.3 - 1.2 mg/dL   GFR, Estimated >60 >60 mL/min    Comment:  (NOTE) Calculated using the CKD-EPI Creatinine Equation (2021)    Anion gap 9 5 - 15    Comment: Performed at [facility], [place] [address] [place], [place] [address]  CBC WITH DIFFERENTIAL     Status: Abnormal   Collection Time: 07/15/21 12:45 PM  Result Value Ref Range   WBC 7.6 4.0 - 10.5 K/uL   RBC 4.80 4.22 - 5.81 MIL/uL   Hemoglobin 12.9 (L) 13.0 - 17.0 g/dL   HCT 40.3 39.0 - 52.0 %   MCV 84.0 80.0 - 100.0 fL   MCH 26.9 26.0 - 34.0 pg  MCHC 32.0 30.0 - 36.0 g/dL   RDW 13.2 11.5 - 15.5 %   Platelets 284 150 - 400 K/uL   nRBC 0.0 0.0 - 0.2 %   Neutrophils Relative % 75 %   Neutro Abs 5.8 1.7 - 7.7 K/uL   Lymphocytes Relative 17 %   Lymphs Abs 1.3 0.7 - 4.0 K/uL   Monocytes Relative 5 %   Monocytes Absolute 0.4 0.1 - 1.0 K/uL   Eosinophils Relative 1 %   Eosinophils Absolute 0.1 0.0 - 0.5 K/uL   Basophils Relative 1 %   Basophils Absolute 0.0 0.0 - 0.1 K/uL   Immature Granulocytes 1 %   Abs Immature Granulocytes 0.07 0.00 - 0.07 K/uL    Comment: Performed at [facility] [address], [place], [place] [address]  Protime-INR     Status: None   Collection Time: 07/15/21 12:45 PM  Result Value Ref Range   Prothrombin Time 12.9 11.4 - 15.2 seconds   INR 1.0 0.8 - 1.2    Comment: (NOTE) INR goal varies based on device and disease states. Performed at [facility], [place] [address] [place], [place] [address]   APTT     Status: None   Collection Time: 07/15/21 12:45 PM  Result Value Ref Range   aPTT 31 24 - 36 seconds    Comment: Performed at [facility] [address], [place], [place] [address]  Hemoglobin A1c     Status: Abnormal   Collection Time: 07/15/21 12:45 PM  Result Value Ref Range   Hgb A1c MFr Bld 10.4 (H) 4.8 - 5.6 %    Comment: (NOTE) Pre diabetes:          5.7%-6.4%  Diabetes:              >6.4%  Glycemic control for   <7.0% adults with diabetes    Mean Plasma Glucose 251.78 mg/dL    Comment: Performed at [facility] [address], [place], [place] [address]  Blood Culture (routine x 2)     Status: None (Preliminary result)   Collection Time: 07/15/21  9:20 PM   Specimen: BLOOD  Result Value Ref Range   Specimen Description BLOOD SITE NOT SPECIFIED    Special Requests      BOTTLES DRAWN AEROBIC AND ANAEROBIC Blood Culture adequate volume   Culture      NO GROWTH < 12 HOURS Performed at [facility], [place] [address], [place], [place] [address]    Report Status PENDING   Lactic acid, plasma     Status: None   Collection Time: 07/15/21  9:30 PM  Result Value Ref Range   Lactic Acid, Venous 1.2 0.5 - 1.9 mmol/L    Comment: Performed at [facility] [address] [place], [place] [address]  Blood Culture (routine x 2)     Status: None (Preliminary result)   Collection Time: 07/15/21  9:30 PM   Specimen: BLOOD  Result Value Ref Range   Specimen Description BLOOD SITE NOT SPECIFIED    Special Requests      BOTTLES DRAWN AEROBIC AND ANAEROBIC Blood Culture adequate volume   Culture      NO GROWTH < 12 HOURS Performed at [facility], [place] [address], [place], [place] [address]    Report Status PENDING   Resp Panel by RT-PCR (Flu A&B, Covid)     Status: None   Collection Time:  07/15/21  9:37 PM   Specimen: Nasopharyngeal(NP) swabs in vial transport medium  Result Value Ref Range   SARS Coronavirus 2 by RT PCR NEGATIVE NEGATIVE    Comment: (NOTE) SARS-CoV-2 target nucleic acids are NOT DETECTED.  The SARS-CoV-2 RNA is generally detectable in upper respiratory specimens during the acute phase of infection. The lowest concentration of SARS-CoV-2 viral copies this assay can detect is 138 copies/mL. A negative result does not preclude SARS-Cov-2 infection and should not be used as the sole basis for treatment or other patient management decisions. A negative result may occur with  improper specimen collection/handling, submission of specimen other than nasopharyngeal  swab, presence of viral mutation(s) within the areas targeted by this assay, and inadequate number of viral copies(<138 copies/mL). A negative result must be combined with clinical observations, patient history, and epidemiological information. The expected result is Negative.  Fact Sheet for Patients:  EntrepreneurPulse.com.au  Fact Sheet for Healthcare Providers:  IncredibleEmployment.be  This test is no t yet approved or cleared by the [place] FDA and  has been authorized for detection and/or diagnosis of SARS-CoV-2 by FDA under an Emergency Use Authorization (EUA). This EUA will remain  in effect (meaning this test can be used) for the duration of the COVID-19 declaration under Section 564(b)(1) of the Act, 21 U.S.C.section 360bbb-3(b)(1), unless the authorization is terminated  or revoked sooner.       Influenza A by PCR NEGATIVE NEGATIVE   Influenza B by PCR NEGATIVE NEGATIVE    Comment: (NOTE) The Xpert Xpress SARS-CoV-2/FLU/RSV plus assay is intended as an aid in the diagnosis of influenza from Nasopharyngeal swab specimens and should not be used as a sole basis for treatment. Nasal washings and aspirates are unacceptable for Xpert Xpress SARS-CoV-2/FLU/RSV testing.  Fact Sheet for Patients: EntrepreneurPulse.com.au  Fact Sheet for Healthcare Providers: IncredibleEmployment.be  This test is not yet approved or cleared by the [place] FDA and has been authorized for detection and/or diagnosis of SARS-CoV-2 by FDA under an Emergency Use Authorization (EUA). This EUA will remain in effect (meaning this test can be used) for the duration of the COVID-19 declaration under Section 564(b)(1) of the Act, 21 U.S.C. section 360bbb-3(b)(1), unless the authorization is terminated or revoked.  Performed at [facility], [place] [address] [place], [place] [address]   CBG monitoring, ED      Status: Abnormal   Collection Time: 07/16/21 12:35 AM  Result Value Ref Range   Glucose-Capillary 186 (H) 70 - 99 mg/dL    Comment: Glucose reference range applies only to samples taken after fasting for at least 8 hours.  CBG monitoring, ED     Status: Abnormal   Collection Time: 07/16/21  8:12 AM  Result Value Ref Range   Glucose-Capillary 223 (H) 70 - 99 mg/dL    Comment: Glucose reference range applies only to samples taken after fasting for at least 8 hours.  Glucose, capillary     Status: Abnormal   Collection Time: 07/16/21 11:15 AM  Result Value Ref Range   Glucose-Capillary 210 (H) 70 - 99 mg/dL    Comment: Glucose reference range applies only to samples taken after fasting for at least 8 hours.   Comment 1 Notify RN   Glucose, capillary     Status: Abnormal   Collection Time: 07/16/21  4:33 PM  Result Value Ref Range   Glucose-Capillary 289 (H) 70 - 99 mg/dL    Comment: Glucose reference range applies only to samples  taken after fasting for at least 8 hours.   Comment 1 Notify RN   Glucose, capillary     Status: Abnormal   Collection Time: 07/16/21  8:03 PM  Result Value Ref Range   Glucose-Capillary 246 (H) 70 - 99 mg/dL    Comment: Glucose reference range applies only to samples taken after fasting for at least 8 hours.    MR FOOT LEFT WO CONTRAST  Result Date: 07/16/2021 CLINICAL DATA:  Foot pain and swelling. EXAM: MRI OF THE LEFT FOOT WITHOUT CONTRAST TECHNIQUE: Multiplanar, multisequence MR imaging of the left foot was performed. No intravenous contrast was administered. COMPARISON:  None. FINDINGS: Bones/Joint/Cartilage Transmetatarsal amputation of the left foot. Irregularity at the amputation site of the metatarsals with bone marrow edema extending into the proximal shaft most severe involving the second, third, fourth and fifth metatarsals. 9 mm fluid collection along the plantar aspect of the third metatarsal stump concerning for an abscess. Small sliver of fluid  along the plantar aspect of the stump of the fourth metatarsal also concerning for an abscess. Normal alignment. No joint effusion. Small full-thickness cartilage fissure of the medial corner of the talar dome with subchondral reactive marrow edema. Ligaments Collateral ligaments are intact.  Lisfranc ligament is intact. Muscles and Tendons Flexor, peroneal and extensor compartment tendons are intact. Generalized muscle atrophy. Soft tissue No other fluid collection or hematoma. No soft tissue mass. Soft tissue wound along the distal stump of the amputation site. IMPRESSION: 1. Transmetatarsal amputation of the left foot. Irregularity at the amputation site of the metatarsals with bone marrow edema extending into the proximal shaft most severe involving the second, third, fourth and fifth metatarsals. Overall appearance is most consistent with osteomyelitis. 2. A 9 mm fluid collection along the plantar aspect of the third metatarsal stump concerning for an abscess. Small sliver of fluid along the plantar aspect of the stump of the fourth metatarsal also concerning for an abscess. Electronically Signed   By: Kathreen Devoid M.D.   On: 07/16/2021 07:23   DG Foot Complete Left  Result Date: 07/15/2021 CLINICAL DATA:  Pain in left foot.  Status post forefoot amputation. EXAM: LEFT FOOT - COMPLETE 3+ VIEW COMPARISON:  06/18/2021 FINDINGS: The patient is status post forefoot amputation at the level of the mid shaft of the metatarsal bones. The surgical margin of the second metatarsal bone is irregular with underlying erosive changes which is concerning for osteomyelitis. Bony fragmentation and exuberant periosteal reaction is identified. There is also irregularity along the surgical margin of the fifth metatarsal bone. Diffuse soft tissue swelling appears increased from previous exam. No definite soft tissue gas identified. Unchanged appearance of fracture deformity at the base of the fifth metatarsal bone. IMPRESSION:  1. Findings concerning for osteomyelitis involving the surgical margin of the second and fifth metatarsal bones. 2. Increase in diffuse soft tissue swelling. No soft tissue gas identified. 3. Stable appearance of fracture deformity at the base of the fifth metatarsal bone. Electronically Signed   By: Kerby Moors M.D.   On: 07/15/2021 13:21   VAS [place] ABI WITH/WO TBI  Result Date: 07/16/2021  LOWER EXTREMITY DOPPLER STUDY Patient Name:  ELAD MACPHAIL  Date of Exam:   07/16/2021 Medical Rec #: [id number]       Accession #:    [id number] Date of Birth: [birth year]      Patient Gender: M Patient Age:   49 years Exam Location:  [facility] Procedure:  VAS [place] ABI WITH/WO TBI Referring Phys: Amila Callies --------------------------------------------------------------------------------  Indications: Peripheral artery disease. High Risk Factors: Hypertension, hyperlipidemia, Diabetes, current smoker.  Vascular Interventions: 12/27/2020 - PARTIAL FIRST RAY AMPUTATION OF FOOT Left. Comparison Study: 12/26/2020 - Right: Resting right ankle-brachial index indicates                   mild right lower                   extremity arterial disease. The right toe-brachial index is                   abnormal.                    Left: Resting left ankle-brachial index indicates moderate                   left lower                   extremity arterial disease. Performing Technologist: Carlos Levering RVT  Examination Guidelines: A complete evaluation includes at minimum, Doppler waveform signals and systolic blood pressure reading at the level of bilateral brachial, anterior tibial, and posterior tibial arteries, when vessel segments are accessible. Bilateral testing is considered an integral part of a complete examination. Photoelectric Plethysmograph (PPG) waveforms and toe systolic pressure readings are included as required and additional duplex testing as needed. Limited examinations for reoccurring indications may be  performed as noted.  ABI Findings: +---------+------------------+-----+----------+--------+  Right     Rt Pressure (mmHg) Index Waveform   Comment   +---------+------------------+-----+----------+--------+  Brachial  162                      triphasic            +---------+------------------+-----+----------+--------+  PTA       115                0.71  monophasic           +---------+------------------+-----+----------+--------+  DP        105                0.65  monophasic           +---------+------------------+-----+----------+--------+  Great Toe 89                 0.55                       +---------+------------------+-----+----------+--------+ +---------+------------------+-----+----------+----------+  Left      Lt Pressure (mmHg) Index Waveform   Comment     +---------+------------------+-----+----------+----------+  Brachial  139                      monophasic             +---------+------------------+-----+----------+----------+  PTA       105                0.65  monophasic             +---------+------------------+-----+----------+----------+  DP        108                0.67  monophasic             +---------+------------------+-----+----------+----------+  Great Toe  Amputation  +---------+------------------+-----+----------+----------+ +-------+-----------+-----------+------------+------------+  ABI/TBI Today's ABI Today's TBI Previous ABI Previous TBI  +-------+-----------+-----------+------------+------------+  Right   0.71        0.55        0.82         0.62          +-------+-----------+-----------+------------+------------+  Left    0.67                    0.76                       +-------+-----------+-----------+------------+------------+  Summary: Right: Resting right ankle-brachial index indicates moderate right lower extremity arterial disease. The right toe-brachial index is abnormal. Left: Resting left ankle-brachial index indicates moderate left  lower extremity arterial disease. Unable to obtain TBI due to first ray amputation.  *See table(s) above for measurements and observations.  Electronically signed by Jamelle Haring on 07/16/2021 at 3:16:13 PM.    Final     Review of Systems  Skin:        Ulcer of left foot  All other systems reviewed and are negative. Blood pressure 117/62, pulse 78, temperature 98.1 F (36.7 C), temperature source Oral, resp. rate 17, SpO2 (!) 87 %.  Vitals:   07/16/21 1552 07/16/21 2000  BP: 123/70 117/62  Pulse: 91 78  Resp: 16 17  Temp: 97.9 F (36.6 C) 98.1 F (36.7 C)  SpO2: 94% (!) 87%    General AA&O x3. Normal mood and affect.  Vascular Dorsalis pedis and posterior tibial pulses  barely palpable left  Capillary refill normal  Pedal hair growth diminished.  Neurologic Epicritic sensation grossly absent.  Dermatologic (Wound) Full-thickness ulceration at amputation stump with purulent drainage  Orthopedic: Motor intact BLE.    Assessment/Plan:  Osteomyelitis of metatarsals -Imaging: Studies independently reviewed -Antibiotics: Continue broad-spectrum -WB Status: NWB LLE -Had a long discussion with him regarding his prospects of limb salvage at this point.  He has had multiple foot surgeries and none have showed significant ability to heal.  He does have peripheral arterial disease and vascular surgery has been consulted for evaluation and likely angiography.  I discussed further amputation of the midfoot like a Lisfranc amputation, discussed with him that functionally this is less successful than a transmetatarsal amputation and wound healing is less likely as well.  Elective below-knee amputation would likely offer a better chance of wound healing and still offer him a chance at ambulating with a prosthetic.  He says at this point he is tired of dealing with the foot its been 6 months since this started and would like to have definitive treatment.  He would like to plan for below-knee  amputation.  I will defer to vascular surgery and/or orthopedics for this.  Criselda Peaches 07/16/2021, 8:29 PM   Best available via secure chat for questions or concerns.

## 2021-07-16 NOTE — Consult Note (Signed)
[facility] OF [place]  ASSESSMENT / PLAN: [name] is a 65 y.o. male with atherosclerosis of native arteries of left lower extremity causing ulceration;  diabetic foot infection .  Patient counseled patients with chronic limb threatening ischemia have an annual risk of cardiovascular mortality of 25% and a high risk of amputation.   Recommend the following which can slow the progression of atherosclerosis and reduce the risk of major adverse cardiac / limb events:  Complete cessation from all tobacco products. Blood glucose control with goal A1c < 7%. Blood pressure control with goal blood pressure < 140/90 mmHg. Lipid reduction therapy with goal LDL-C <100 mg/dL (<70 if symptomatic from PAD).  Aspirin 81mg  PO QD.  Atorvastatin 40-80mg  PO QD (or other "high intensity" statin therapy).  Plan left lower extremity angiogram with possible intervention via right common femoral approach in cath lab 07/17/2021.  N.p.o. after midnight.  Orders for consent written.  CHIEF COMPLAINT: Left foot worsening  HISTORY OF PRESENT ILLNESS: [name] is a 65 y.o. male with multiple comorbidities (see below) who presents to [facility], ER for evaluation of deteriorating left foot.  He has been under the care of the [facility] team.  He is status post transmetatarsal amputation.  Recent MRI shows osteomyelitis in the third metatarsal.  Vascular surgery consultation was requested to evaluate for amputation versus limb salvage.  The patient reports he is ambulatory but only around his house.  He is somewhat independent and able to attend to his ADLs with assistance.  VASCULAR SURGICAL HISTORY: Right carotid endarterectomy 10/08/2014 with Dr. Donnetta Hutching  VASCULAR RISK FACTORS: Positive history of stroke / transient ischemic attack. Positive history of coronary artery disease. + history of PCI.  Negative history of CABG.  Positive history of diabetes mellitus. Last A1c  10.4. Positive history of smoking. Actively smoking. Positive history of hypertension.  Negative history of chronic kidney disease.  Last GFR >60.  Positive history of chronic obstructive pulmonary disease.  FUNCTIONAL STATUS: ECOG performance status: (2) Ambulatory and capable of self care, unable to carry out work activity, up and about > 50% or waking hours Ambulatory status: Minimally ambulatory (e.g. about the home only)  Past Medical History:  Diagnosis Date   Anxiety    Arthritis    Cholecystitis, acute 12/20/2013   Lap chole 6/5   Cocaine abuse ([name])    COPD (chronic obstructive pulmonary disease) ([place])    Coronary atherosclerosis of native coronary artery    a. 03/09/2013 Cath/PCI: LM nl, LAD: 50p, 53m (2.5x16 promus DES), LCX nl, OM1 min irregs, LPL/LPDA diff dzs, RCA nondom, mod diff dzs, EF 55%.   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 ([place]) 04/24/2021   History of nephrolithiasis    History of pneumonia    History of stroke    Right MCA distribution, residual left-sided weakness   Hyperlipidemia    Noncompliance    Osteomyelitis of second toe of left foot ([place]) 04/24/2021   Renal cell carcinoma ([place])    Status post radical right nephrectomy August 2015   Sleep apnea    On CPAP, 4L O2 no cpap at home yet   Type 2 diabetes mellitus ([place]) 2011    Past Surgical History:  Procedure Laterality Date   AMPUTATION Left 12/27/2020   Procedure: PARTIAL FIRST  RAY AMPUTATION OF FOOT;  Surgeon: Felipa Furnace, DPM;  Location: [place];  Service: Podiatry;  Laterality: Left;   APPENDECTOMY  1970's   CHOLECYSTECTOMY N/A 12/22/2013   Procedure: LAPAROSCOPIC CHOLECYSTECTOMY ;  Surgeon: [name], MD;  Location: [place];  Service: General;  Laterality: N/A;   ENDARTERECTOMY Right 10/08/2014   Procedure: Right ENDARTERECTOMY CAROTID;  Surgeon:  [name], MD;  Location: [place];  Service: Vascular;  Laterality: Right;   LAPAROSCOPIC LYSIS OF ADHESIONS  02/21/2014   Procedure: LAPAROSCOPIC LYSIS OF ADHESIONS EXTINSIVE;  Surgeon: [name], MD;  Location: [facility];  Service: Urology;;   LEFT HEART CATHETERIZATION WITH CORONARY ANGIOGRAM N/A 06/02/2012   Procedure: LEFT HEART CATHETERIZATION WITH CORONARY ANGIOGRAM;  Surgeon: [name], MD;  Location: [facility] CATH LAB;  Service: Cardiovascular;  Laterality: N/A;   LEFT HEART CATHETERIZATION WITH CORONARY ANGIOGRAM N/A 03/09/2013   Procedure: LEFT HEART CATHETERIZATION WITH CORONARY ANGIOGRAM;  Surgeon: [name], MD;  Location: [facility] CATH LAB;  Service: Cardiovascular;  Laterality: N/A;   LEFT HEART CATHETERIZATION WITH CORONARY ANGIOGRAM N/A 12/20/2013   Procedure: LEFT HEART CATHETERIZATION WITH CORONARY ANGIOGRAM;  Surgeon: [name], MD;  Location: [facility] LAB;  Service: Cardiovascular;  Laterality: N/A;   LUNG BIOPSY Left 05/18/2014   Procedure: LUNG BIOPSY left upper lobe & left lower lobe;  Surgeon: [name], MD;  Location: [place];  Service: Thoracic;  Laterality: Left;   PATCH ANGIOPLASTY Right 10/08/2014   Procedure: PATCH ANGIOPLASTY Right Carotid;  Surgeon: [name], MD;  Location: [place];  Service: Vascular;  Laterality: Right;   ROBOT ASSISTED LAPAROSCOPIC NEPHRECTOMY Right 02/21/2014   Procedure: ROBOTIC ASSISTED LAPAROSCOPIC RIGHT NEPHRECTOMY ;  Surgeon: [name], MD;  Location: [facility];  Service: Urology;  Laterality: Right;   VIDEO ASSISTED THORACOSCOPY Left 05/18/2014   Procedure: LEFT VIDEO ASSISTED THORACOSCOPY;  Surgeon: [name], MD;  Location: [place];  Service: Thoracic;  Laterality: Left;   VIDEO BRONCHOSCOPY  05/18/2014   Procedure: VIDEO BRONCHOSCOPY;  Surgeon: [name], MD;  Location: [facility] OR;  Service: Thoracic;;    Family History  Problem Relation Age of Onset   Cancer Mother        Thyroid - living in  her 26's.   Hypertension Other    Diabetes Other    Stroke Other    Lung cancer Father    CAD Father    Cancer Maternal Grandmother        Breast   Cancer Maternal Grandfather        Throat and stomach   CAD Brother     Social History   Socioeconomic History   Marital status: Single    Spouse name: Not on file   Number of children: Not on file   Years of education: Not on file   Highest education level: Not on file  Occupational History   Occupation: N/A  Tobacco Use   Smoking status: Every Day    Packs/day: 1.00    Years: 41.00    Pack years: 41.00    Types: Cigarettes    Start date: 05/03/1972   Smokeless tobacco: Never   Tobacco comments:    1/2 ppd 06/05/21  Vaping Use   Vaping Use: Never used  Substance and Sexual Activity   Alcohol use: No    Alcohol/week: 0.0 standard drinks   Drug use: No    Types: Cocaine    Comment: last used cocaine 11/2013.  but is cocaine + this admit July 2015   Sexual activity: Never  Other Topics Concern   Not on file  Social History Narrative   Lives in [place] with his mother.  He does not work.   Social Determinants of Health   Financial Resource Strain: Not on file  Food Insecurity: Not on file  Transportation Needs: Not on file  Physical Activity: Not on file  Stress: Not on file  Social Connections: Not on file  Intimate Partner Violence: Not on file    Allergies  Allergen Reactions   Lisinopril Other (See Comments)    Other reaction(s): worsening kidney function   Oxycodone Itching and Nausea Only    Pt is able to tolerate if taken with benadryl    Current Facility-Administered Medications  Medication Dose Route Frequency Provider Last Rate Last Admin   acetaminophen (TYLENOL) tablet 650 mg  650 mg Oral Q6H PRN Shela Leff, MD       Or   acetaminophen (TYLENOL) suppository 650 mg  650 mg Rectal Q6H PRN Shela Leff, MD       albuterol (PROVENTIL) (2.5 MG/3ML) 0.083% nebulizer solution 2.5 mg   2.5 mg Nebulization Q6H PRN Shela Leff, MD       ALPRAZolam Duanne Moron) tablet 1 mg  1 mg Oral TID PRN Shela Leff, MD       atorvastatin (LIPITOR) tablet 20 mg  20 mg Oral QHS Shela Leff, MD   20 mg at 07/16/21 0046   diltiazem (CARDIZEM CD) 24 hr capsule 180 mg  180 mg Oral Daily Shela Leff, MD   180 mg at 07/16/21 1218   fluticasone furoate-vilanterol (BREO ELLIPTA) 200-25 MCG/ACT 1 puff  1 puff Inhalation Daily Shela Leff, MD   1 puff at 07/16/21 0807   gabapentin (NEURONTIN) capsule 900 mg  900 mg Oral TID Shela Leff, MD   900 mg at 07/16/21 1217   insulin aspart (novoLOG) injection 0-9 Units  0-9 Units Subcutaneous Q4H Shela Leff, MD   3 Units at 07/16/21 1219   isosorbide mononitrate (IMDUR) 24 hr tablet 60 mg  60 mg Oral Daily Shela Leff, MD   60 mg at 07/16/21 1216   pantoprazole (PROTONIX) EC tablet 40 mg  40 mg Oral Daily Shela Leff, MD   40 mg at 07/16/21 1218   pregabalin (LYRICA) capsule 25 mg  25 mg Oral BID Shela Leff, MD   25 mg at 07/16/21 1218   tamsulosin (FLOMAX) capsule 0.4 mg  0.4 mg Oral Daily Shela Leff, MD   0.4 mg at 07/16/21 1219   umeclidinium bromide (INCRUSE ELLIPTA) 62.5 MCG/ACT 1 puff  1 puff Inhalation Daily Shela Leff, MD   1 puff at 07/16/21 0806    REVIEW OF SYSTEMS:  [X]  denotes positive finding, [ ]  denotes negative finding Cardiac  Comments:  Chest pain or chest pressure:    Shortness of breath upon exertion:    Short of breath when lying flat:    Irregular heart rhythm:        Vascular    Pain in calf, thigh, or hip brought on by ambulation:    Pain in feet at night that wakes you up from your sleep:     Blood clot in your veins:    Leg swelling:         Pulmonary    Oxygen at home:    Productive cough:     Wheezing:         Neurologic  Sudden weakness in arms or legs:     Sudden numbness in arms or legs:     Sudden onset of difficulty speaking or  slurred speech:    Temporary loss of vision in one eye:     Problems with dizziness:         Gastrointestinal    Blood in stool:     Vomited blood:         Genitourinary    Burning when urinating:     Blood in urine:        Psychiatric    Major depression:         Hematologic    Bleeding problems:    Problems with blood clotting too easily:        Skin    Rashes or ulcers:        Constitutional    Fever or chills:      PHYSICAL EXAM Vitals:   07/16/21 0630 07/16/21 0730 07/16/21 0800 07/16/21 0905  BP: (!) 175/95 (!) 143/72 (!) 156/82 (!) 174/98  Pulse: 75 72 74 91  Resp: 17 18 17 16   Temp:    97.8 F (36.6 C)  TempSrc:    Oral  SpO2: 92% 94% 92% 95%    Constitutional: chronically ill appearing older than stated age. no distress. Appears well nourished.  Neurologic: CN intact. no focal findings. no sensory loss. Psychiatric:  Mood and affect symmetric and appropriate. Eyes:  No icterus. No conjunctival pallor. Ears, nose, throat:  mucous membranes moist. Midline trachea.  Cardiac: regular rate and rhythm.  Respiratory:  unlabored. Abdominal:  soft, non-tender, non-distended.  Peripheral vascular: 2+ femoral pulses. Absent pedal pulses. L TMA with ulceration Extremity: no edema. no cyanosis. no pallor.  Skin: no gangrene. L tma with mid incision ulceration.  Lymphatic: no Stemmer's sign. no palpable lymphadenopathy.  PERTINENT LABORATORY AND RADIOLOGIC DATA  Most recent CBC CBC Latest Ref Rng & Units 07/15/2021 06/05/2021 04/24/2021  WBC 4.0 - 10.5 K/uL 7.6 14.8(H) 10.5  Hemoglobin 13.0 - 17.0 g/dL 12.9(L) 12.4(L) 13.4  Hematocrit 39.0 - 52.0 % 40.3 37.2(L) 41.0  Platelets 150 - 400 K/uL 284 229.0 238     Most recent CMP CMP Latest Ref Rng & Units 07/15/2021 06/05/2021 04/24/2021  Glucose 70 - 99 mg/dL 290(H) 441(H) 547(HH)  BUN 8 - 23 mg/dL 8 28(H) 22  Creatinine 0.61 - 1.24 mg/dL 1.27(H) 1.89(H) 1.89(H)  Sodium 135 - 145 mmol/L 136 128(L) 130(L)   Potassium 3.5 - 5.1 mmol/L 3.9 3.9 4.4  Chloride 98 - 111 mmol/L 102 90(L) 92(L)  CO2 22 - 32 mmol/L 25 29 24   Calcium 8.9 - 10.3 mg/dL 9.8 8.5 8.4(L)  Total Protein 6.5 - 8.1 g/dL 7.3 7.7 6.9  Total Bilirubin 0.3 - 1.2 mg/dL 0.3 0.6 0.4  Alkaline Phos 38 - 126 U/L 68 85 -  AST 15 - 41 U/L 12(L) 8 9(L)  ALT 0 - 44 U/L 13 7 6(L)    Renal function CrCl cannot be calculated (Unknown ideal weight.).  Hgb A1c MFr Bld (%)  Date Value  07/15/2021 10.4 (H)    LDL Cholesterol  Date Value Ref Range Status  12/19/2013 67 0 - 99 mg/dL Final    Comment:           Total Cholesterol/HDL:CHD Risk Coronary Heart Disease Risk Table                     Men   Women  1/2 Average Risk   3.4   3.3  Average Risk       5.0   4.4  2 X Average Risk   9.6   7.1  3 X Average Risk  23.4   11.0        Use the calculated Patient Ratio above and the CHD Risk Table to determine the patient's CHD Risk.        ATP III CLASSIFICATION (LDL):  <100     mg/dL   Optimal  100-129  mg/dL   Near or Above                    Optimal  130-159  mg/dL   Borderline  160-189  mg/dL   High  >190     mg/dL   Very High     +-------+-----------+-----------+------------+------------+   ABI/TBI Today's ABI Today's TBI Previous ABI Previous TBI   +-------+-----------+-----------+------------+------------+   Right   0.71        0.55        0.82         0.62           +-------+-----------+-----------+------------+------------+   Left    0.67                    0.76                        +-------+-----------+-----------+------------+------------+   Yevonne Aline. Stanford Breed, MD [facility] Phone Number: [phone] 07/16/2021 3:30 PM  Total time spent on preparing this encounter including chart review, data review, collecting history, examining the patient, coordinating care for this new patient, 60 minutes.  Portions of this report may have been transcribed using voice recognition  software.  Every effort has been made to ensure accuracy; however, inadvertent computerized transcription errors may still be present.

## 2021-07-16 NOTE — Treatment Plan (Signed)
Full consult note to follow   New ABI reviewed, worsened since June 2022  I discussed his prospects of limb salvage with him, I think a further partial foot amputation such as a Lisfranc may still carry significant risk that it may not heal, and the more proximal in the foot get the less functional it becomes as far as ambulation.  Discussed with him the option of an elective below-knee amputation and he is interested in pursuing this and moving on from his foot wounds at this point which is understandable  Would recommend a vascular surgery consult for evaluation of his arterial flow and if he would be able to heal a below-knee amputation, will defer to them if they would like to perform below-knee amputation or consult orthopedics for this  Lanae Crumbly, [facility] 07/16/2021

## 2021-07-16 NOTE — H&P (View-Only) (Signed)
VASC[facility] VEIN SPECIALISTS OF   ASSESSMENT / PLAN: [name] is a 65 y.o. male with atherosclerosis of native arteries of left lower extremity causing ulceration;  diabetic foot infection .  Patient counseled patients with chronic limb threatening ischemia have an annual risk of cardiovascular mortality of 25% and a high risk of amputation.   Recommend the following which can slow the progression of atherosclerosis and reduce the risk of major adverse cardiac / limb events:  Complete cessation from all tobacco products. Blood glucose control with goal A1c < 7%. Blood pressure control with goal blood pressure < 140/90 mmHg. Lipid reduction therapy with goal LDL-C <100 mg/dL (<70 if symptomatic from PAD).  Aspirin 81mg  PO QD.  Atorvastatin 40-80mg  PO QD (or other "high intensity" statin therapy).  Plan left lower extremity angiogram with possible intervention via right common femoral approach in cath lab 07/17/2021.  N.p.o. after midnight.  Orders for consent written.  CHIEF COMPLAINT: Left foot worsening  HISTORY OF PRESENT ILLNESS: [name] is a 65 y.o. male with multiple comorbidities (see below) who presents to [facility], ER for evaluation of deteriorating left foot.  He has been under the care of the [facility] pod[facility]ry team.  He is status post transmetatarsal amputation.  Recent MRI shows osteomyelitis in the third metatarsal.  Vascular surgery consultation was requested to evaluate for amputation versus limb salvage.  The patient reports he is ambulatory but only around his house.  He is somewhat independent and able to attend to his ADLs with assistance.  VASCULAR SURGICAL HISTORY: Right carotid endarterectomy 10/08/2014 with Dr. Donnetta Hutching  VASCULAR RISK FACTORS: Positive history of stroke / transient ischemic attack. Positive history of coronary artery disease. + history of PCI.  Negative history of CABG.  Positive history of diabetes mellitus. Last A1c  10.4. Positive history of smoking. Actively smoking. Positive history of hypertension.  Negative history of chronic kidney disease.  Last GFR >60.  Positive history of chronic obstructive pulmonary disease.  FUNCTIONAL STATUS: ECOG performance status: (2) Ambulatory and capable of self care, unable to carry out work activity, up and about > 50% or waking hours Ambulatory status: Minimally ambulatory (e.g. about the home only)  Past Medical History:  Diagnosis Date   Anxiety    Arthritis    Cholecystitis, acute 12/20/2013   Lap chole 6/5   Cocaine abuse ([name])    COPD (chronic obstructive pulmonary disease) ([place])    Coronary atherosclerosis of native coronary artery    a. 03/09/2013 Cath/PCI: LM nl, LAD: 50p, 34m (2.5x16 promus DES), LCX nl, OM1 min irregs, LPL/LPDA diff dzs, RCA nondom, mod diff dzs, EF 55%.   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 ([place]) 04/24/2021   History of nephrolithiasis    History of pneumonia    History of stroke    Right MCA distribution, residual left-sided weakness   Hyperlipidemia    Noncompliance    Osteomyelitis of second toe of left foot ([place]) 04/24/2021   Renal cell carcinoma ([place])    Status post radical right nephrectomy August 2015   Sleep apnea    On CPAP, 4L O2 no cpap at home yet   Type 2 diabetes mellitus ([place]) 2011    Past Surgical History:  Procedure Laterality Date   AMPUTATION Left 12/27/2020   Procedure: PARTIAL FIRST  RAY AMPUTATION OF FOOT;  Surgeon: Felipa Furnace, DPM;  Location: [place];  Service: Podiatry;  Laterality: Left;   APPENDECTOMY  1970's   CHOLECYSTECTOMY N/A 12/22/2013   Procedure: LAPAROSCOPIC CHOLECYSTECTOMY ;  Surgeon: [name], MD;  Location: [place];  Service: General;  Laterality: N/A;   ENDARTERECTOMY Right 10/08/2014   Procedure: Right ENDARTERECTOMY CAROTID;  Surgeon:  [name], MD;  Location: [place];  Service: Vascular;  Laterality: Right;   LAPAROSCOPIC LYSIS OF ADHESIONS  02/21/2014   Procedure: LAPAROSCOPIC LYSIS OF ADHESIONS EXTINSIVE;  Surgeon: [name], MD;  Location: [facility];  Service: Urology;;   LEFT HEART CATHETERIZATION WITH CORONARY ANGIOGRAM N/A 06/02/2012   Procedure: LEFT HEART CATHETERIZATION WITH CORONARY ANGIOGRAM;  Surgeon: [name], MD;  Location: [facility] CATH LAB;  Service: Cardiovascular;  Laterality: N/A;   LEFT HEART CATHETERIZATION WITH CORONARY ANGIOGRAM N/A 03/09/2013   Procedure: LEFT HEART CATHETERIZATION WITH CORONARY ANGIOGRAM;  Surgeon: [name], MD;  Location: [facility] CATH LAB;  Service: Cardiovascular;  Laterality: N/A;   LEFT HEART CATHETERIZATION WITH CORONARY ANGIOGRAM N/A 12/20/2013   Procedure: LEFT HEART CATHETERIZATION WITH CORONARY ANGIOGRAM;  Surgeon: [name], MD;  Location: [facility] CATH LAB;  Service: Cardiovascular;  Laterality: N/A;   LUNG BIOPSY Left 05/18/2014   Procedure: LUNG BIOPSY left upper lobe & left lower lobe;  Surgeon: [name], MD;  Location: [place];  Service: Thoracic;  Laterality: Left;   PATCH ANGIOPLASTY Right 10/08/2014   Procedure: PATCH ANGIOPLASTY Right Carotid;  Surgeon: [name], MD;  Location: [place];  Service: Vascular;  Laterality: Right;   ROBOT ASSISTED LAPAROSCOPIC NEPHRECTOMY Right 02/21/2014   Procedure: ROBOTIC ASSISTED LAPAROSCOPIC RIGHT NEPHRECTOMY ;  Surgeon: [name], MD;  Location: [facility];  Service: Urology;  Laterality: Right;   VIDEO ASSISTED THORACOSCOPY Left 05/18/2014   Procedure: LEFT VIDEO ASSISTED THORACOSCOPY;  Surgeon: [name], MD;  Location: [place];  Service: Thoracic;  Laterality: Left;   VIDEO BRONCHOSCOPY  05/18/2014   Procedure: VIDEO BRONCHOSCOPY;  Surgeon: [name], MD;  Location: [facility];  Service: Thoracic;;    Family History  Problem Relation Age of Onset   Cancer Mother        Thyroid - living in  her 74's.   Hypertension Other    Diabetes Other    Stroke Other    Lung cancer Father    CAD Father    Cancer Maternal Grandmother        Breast   Cancer Maternal Grandfather        Throat and stomach   CAD Brother     Social History   Socioeconomic History   Marital status: Single    Spouse name: Not on file   Number of children: Not on file   Years of education: Not on file   Highest education level: Not on file  Occupational History   Occupation: N/A  Tobacco Use   Smoking status: Every Day    Packs/day: 1.00    Years: 41.00    Pack years: 41.00    Types: Cigarettes    Start date: 05/03/1972   Smokeless tobacco: Never   Tobacco comments:    1/2 ppd 06/05/21  Vaping Use   Vaping Use: Never used  Substance and Sexual Activity   Alcohol use: No    Alcohol/week: 0.0 standard drinks   Drug use: No    Types: Cocaine    Comment: last used cocaine 11/2013.  but is cocaine + this admit July 2015   Sexual activity: Never  Other Topics Concern   Not on file  Social History Narrative   Lives in [place] with his mother.  He does not work.   Social Determinants of Health   Financial Resource Strain: Not on file  Food Insecurity: Not on file  Transportation Needs: Not on file  Physical Activity: Not on file  Stress: Not on file  Social Connections: Not on file  Intimate Partner Violence: Not on file    Allergies  Allergen Reactions   Lisinopril Other (See Comments)    Other reaction(s): worsening kidney function   Oxycodone Itching and Nausea Only    Pt is able to tolerate if taken with benadryl    Current Facility-Administered Medications  Medication Dose Route Frequency Provider Last Rate Last Admin   acetaminophen (TYLENOL) tablet 650 mg  650 mg Oral Q6H PRN Shela Leff, MD       Or   acetaminophen (TYLENOL) suppository 650 mg  650 mg Rectal Q6H PRN Shela Leff, MD       albuterol (PROVENTIL) (2.5 MG/3ML) 0.083% nebulizer solution 2.5 mg   2.5 mg Nebulization Q6H PRN Shela Leff, MD       ALPRAZolam Duanne Moron) tablet 1 mg  1 mg Oral TID PRN Shela Leff, MD       atorvastatin (LIPITOR) tablet 20 mg  20 mg Oral QHS Shela Leff, MD   20 mg at 07/16/21 0046   diltiazem (CARDIZEM CD) 24 hr capsule 180 mg  180 mg Oral Daily Shela Leff, MD   180 mg at 07/16/21 1218   fluticasone furoate-vilanterol (BREO ELLIPTA) 200-25 MCG/ACT 1 puff  1 puff Inhalation Daily Shela Leff, MD   1 puff at 07/16/21 0807   gabapentin (NEURONTIN) capsule 900 mg  900 mg Oral TID Shela Leff, MD   900 mg at 07/16/21 1217   insulin aspart (novoLOG) injection 0-9 Units  0-9 Units Subcutaneous Q4H Shela Leff, MD   3 Units at 07/16/21 1219   isosorbide mononitrate (IMDUR) 24 hr tablet 60 mg  60 mg Oral Daily Shela Leff, MD   60 mg at 07/16/21 1216   pantoprazole (PROTONIX) EC tablet 40 mg  40 mg Oral Daily Shela Leff, MD   40 mg at 07/16/21 1218   pregabalin (LYRICA) capsule 25 mg  25 mg Oral BID Shela Leff, MD   25 mg at 07/16/21 1218   tamsulosin (FLOMAX) capsule 0.4 mg  0.4 mg Oral Daily Shela Leff, MD   0.4 mg at 07/16/21 1219   umeclidinium bromide (INCRUSE ELLIPTA) 62.5 MCG/ACT 1 puff  1 puff Inhalation Daily Shela Leff, MD   1 puff at 07/16/21 0806    REVIEW OF SYSTEMS:  [X]  denotes positive finding, [ ]  denotes negative finding Cardiac  Comments:  Chest pain or chest pressure:    Shortness of breath upon exertion:    Short of breath when lying flat:    Irregular heart rhythm:        Vascular    Pain in calf, thigh, or hip brought on by ambulation:    Pain in feet at night that wakes you up from your sleep:     Blood clot in your veins:    Leg swelling:         Pulmonary    Oxygen at home:    Productive cough:     Wheezing:         Neurologic  Sudden weakness in arms or legs:     Sudden numbness in arms or legs:     Sudden onset of difficulty speaking or  slurred speech:    Temporary loss of vision in one eye:     Problems with dizziness:         Gastrointestinal    Blood in stool:     Vomited blood:         Genitourinary    Burning when urinating:     Blood in urine:        Psychiatric    Major depression:         Hematologic    Bleeding problems:    Problems with blood clotting too easily:        Skin    Rashes or ulcers:        Constitutional    Fever or chills:      PHYSICAL EXAM Vitals:   07/16/21 0630 07/16/21 0730 07/16/21 0800 07/16/21 0905  BP: (!) 175/95 (!) 143/72 (!) 156/82 (!) 174/98  Pulse: 75 72 74 91  Resp: 17 18 17 16   Temp:    97.8 F (36.6 C)  TempSrc:    Oral  SpO2: 92% 94% 92% 95%    Constitutional: chronically ill appearing older than stated age. no distress. Appears well nourished.  Neurologic: CN intact. no focal findings. no sensory loss. Psychiatric:  Mood and affect symmetric and appropriate. Eyes:  No icterus. No conjunctival pallor. Ears, nose, throat:  mucous membranes moist. Midline trachea.  Cardiac: regular rate and rhythm.  Respiratory:  unlabored. Abdominal:  soft, non-tender, non-distended.  Peripheral vascular: 2+ femoral pulses. Absent pedal pulses. L TMA with ulceration Extremity: no edema. no cyanosis. no pallor.  Skin: no gangrene. L tma with mid incision ulceration.  Lymphatic: no Stemmer's sign. no palpable lymphadenopathy.  PERTINENT LABORATORY AND RADIOLOGIC DATA  Most recent CBC CBC Latest Ref Rng & Units 07/15/2021 06/05/2021 04/24/2021  WBC 4.0 - 10.5 K/uL 7.6 14.8(H) 10.5  Hemoglobin 13.0 - 17.0 g/dL 12.9(L) 12.4(L) 13.4  Hematocrit 39.0 - 52.0 % 40.3 37.2(L) 41.0  Platelets 150 - 400 K/uL 284 229.0 238     Most recent CMP CMP Latest Ref Rng & Units 07/15/2021 06/05/2021 04/24/2021  Glucose 70 - 99 mg/dL 290(H) 441(H) 547(HH)  BUN 8 - 23 mg/dL 8 28(H) 22  Creatinine 0.61 - 1.24 mg/dL 1.27(H) 1.89(H) 1.89(H)  Sodium 135 - 145 mmol/L 136 128(L) 130(L)   Potassium 3.5 - 5.1 mmol/L 3.9 3.9 4.4  Chloride 98 - 111 mmol/L 102 90(L) 92(L)  CO2 22 - 32 mmol/L 25 29 24   Calcium 8.9 - 10.3 mg/dL 9.8 8.5 8.4(L)  Total Protein 6.5 - 8.1 g/dL 7.3 7.7 6.9  Total Bilirubin 0.3 - 1.2 mg/dL 0.3 0.6 0.4  Alkaline Phos 38 - 126 U/L 68 85 -  AST 15 - 41 U/L 12(L) 8 9(L)  ALT 0 - 44 U/L 13 7 6(L)    Renal function CrCl cannot be calculated (Unknown ideal weight.).  Hgb A1c MFr Bld (%)  Date Value  07/15/2021 10.4 (H)    LDL Cholesterol  Date Value Ref Range Status  12/19/2013 67 0 - 99 mg/dL Final    Comment:           Total Cholesterol/HDL:CHD Risk Coronary Heart Disease Risk Table                     Men   Women  1/2 Average Risk   3.4   3.3  Average Risk       5.0   4.4  2 X Average Risk   9.6   7.1  3 X Average Risk  23.4   11.0        Use the calculated Patient Ratio above and the CHD Risk Table to determine the patient's CHD Risk.        ATP III CLASSIFICATION (LDL):  <100     mg/dL   Optimal  100-129  mg/dL   Near or Above                    Optimal  130-159  mg/dL   Borderline  160-189  mg/dL   High  >190     mg/dL   Very High     +-------+-----------+-----------+------------+------------+   ABI/TBI Today's ABI Today's TBI Previous ABI Previous TBI   +-------+-----------+-----------+------------+------------+   Right   0.71        0.55        0.82         0.62           +-------+-----------+-----------+------------+------------+   Left    0.67                    0.76                        +-------+-----------+-----------+------------+------------+   Yevonne Aline. Stanford Breed, MD [facility] Phone Number: [phone] 07/16/2021 3:30 PM  Total time spent on preparing this encounter including chart review, data review, collecting history, examining the patient, coordinating care for this new patient, 60 minutes.  Portions of this report may have been transcribed using voice recognition  software.  Every effort has been made to ensure accuracy; however, inadvertent computerized transcription errors may still be present.

## 2021-07-17 ENCOUNTER — Encounter (HOSPITAL_COMMUNITY): Payer: Self-pay | Admitting: Surgery

## 2021-07-17 ENCOUNTER — Inpatient Hospital Stay (HOSPITAL_COMMUNITY)
Admission: EM | Discharge status: Against Medical Advice | Payer: Self-pay | Source: Home / Self Care | Attending: Internal Medicine

## 2021-07-17 DIAGNOSIS — L97529 Non-pressure chronic ulcer of other part of left foot with unspecified severity: Secondary | ICD-10-CM

## 2021-07-17 DIAGNOSIS — M869 Osteomyelitis, unspecified: Secondary | ICD-10-CM

## 2021-07-17 DIAGNOSIS — I70245 Atherosclerosis of native arteries of left leg with ulceration of other part of foot: Secondary | ICD-10-CM

## 2021-07-17 DIAGNOSIS — Z89432 Acquired absence of left foot: Secondary | ICD-10-CM

## 2021-07-17 HISTORY — PX: PERIPHERAL VASCULAR INTERVENTION: CATH118257

## 2021-07-17 HISTORY — PX: ABDOMINAL AORTOGRAM W/LOWER EXTREMITY: CATH118223

## 2021-07-17 LAB — GLUCOSE, CAPILLARY
Glucose-Capillary: 218 mg/dL — ABNORMAL HIGH (ref 70–99)
Glucose-Capillary: 227 mg/dL — ABNORMAL HIGH (ref 70–99)
Glucose-Capillary: 235 mg/dL — ABNORMAL HIGH (ref 70–99)
Glucose-Capillary: 236 mg/dL — ABNORMAL HIGH (ref 70–99)
Glucose-Capillary: 281 mg/dL — ABNORMAL HIGH (ref 70–99)
Glucose-Capillary: 315 mg/dL — ABNORMAL HIGH (ref 70–99)

## 2021-07-17 LAB — BASIC METABOLIC PANEL
Anion gap: 7 (ref 5–15)
BUN: 12 mg/dL (ref 8–23)
CO2: 27 mmol/L (ref 22–32)
Calcium: 8.6 mg/dL — ABNORMAL LOW (ref 8.9–10.3)
Chloride: 100 mmol/L (ref 98–111)
Creatinine, Ser: 1.8 mg/dL — ABNORMAL HIGH (ref 0.61–1.24)
GFR, Estimated: 41 mL/min — ABNORMAL LOW (ref >60)
Glucose, Bld: 247 mg/dL — ABNORMAL HIGH (ref 70–99)
Potassium: 3.6 mmol/L (ref 3.5–5.1)
Sodium: 134 mmol/L — ABNORMAL LOW (ref 135–145)

## 2021-07-17 LAB — CBC
HCT: 33.5 % — ABNORMAL LOW (ref 39.0–52.0)
Hemoglobin: 10.6 g/dL — ABNORMAL LOW (ref 13.0–17.0)
MCH: 26.5 pg (ref 26.0–34.0)
MCHC: 31.6 g/dL (ref 30.0–36.0)
MCV: 83.8 fL (ref 80.0–100.0)
Platelets: 276 10*3/uL (ref 150–400)
RBC: 4 MIL/uL — ABNORMAL LOW (ref 4.22–5.81)
RDW: 13.5 % (ref 11.5–15.5)
WBC: 9.3 10*3/uL (ref 4.0–10.5)
nRBC: 0 % (ref 0.0–0.2)

## 2021-07-17 LAB — POCT ACTIVATED CLOTTING TIME
Activated Clotting Time: 209 seconds
Activated Clotting Time: 221 seconds

## 2021-07-17 SURGERY — ABDOMINAL AORTOGRAM W/LOWER EXTREMITY
Anesthesia: LOCAL | Laterality: Left

## 2021-07-17 MED ORDER — IODIXANOL 320 MG/ML IV SOLN
INTRAVENOUS | Status: DC | PRN
  Administered 2021-07-17: 12:00:00 60 mL

## 2021-07-17 MED ORDER — SODIUM CHLORIDE 0.9 % IV SOLN: 250.0000 mL | INTRAVENOUS | Status: DC | PRN

## 2021-07-17 MED ORDER — ONDANSETRON HCL 4 MG/2ML IJ SOLN: 4.0000 mg | Freq: Four times a day (QID) | INTRAMUSCULAR | Status: DC | PRN

## 2021-07-17 MED ORDER — HEPARIN SODIUM (PORCINE) 1000 UNIT/ML IJ SOLN
INTRAMUSCULAR | Status: AC
  Filled 2021-07-17: qty 10

## 2021-07-17 MED ORDER — MIDAZOLAM HCL 2 MG/2ML IJ SOLN
INTRAMUSCULAR | Status: AC
  Filled 2021-07-17: qty 2

## 2021-07-17 MED ORDER — ASPIRIN EC 81 MG PO TBEC
81.0000 mg | DELAYED_RELEASE_TABLET | Freq: Every day | ORAL | Status: DC
  Administered 2021-07-17 – 2021-07-18 (×2): 81 mg via ORAL
  Filled 2021-07-17 (×2): qty 1

## 2021-07-17 MED ORDER — LIDOCAINE HCL (PF) 1 % IJ SOLN
INTRAMUSCULAR | Status: AC
  Filled 2021-07-17: qty 30

## 2021-07-17 MED ORDER — SODIUM CHLORIDE 0.9 % WEIGHT BASED INFUSION
1.0000 mL/kg/h | INTRAVENOUS | Status: AC
  Administered 2021-07-17: 17:00:00 1 mL/kg/h via INTRAVENOUS

## 2021-07-17 MED ORDER — OXYCODONE-ACETAMINOPHEN 5-325 MG PO TABS
1.0000 | ORAL_TABLET | Freq: Four times a day (QID) | ORAL | Status: DC | PRN
  Administered 2021-07-17: 23:00:00 1 via ORAL
  Filled 2021-07-17 (×2): qty 1

## 2021-07-17 MED ORDER — LABETALOL HCL 5 MG/ML IV SOLN: 10.0000 mg | INTRAVENOUS | Status: DC | PRN

## 2021-07-17 MED ORDER — HYDRALAZINE HCL 20 MG/ML IJ SOLN: 5.0000 mg | INTRAMUSCULAR | Status: DC | PRN

## 2021-07-17 MED ORDER — FENTANYL CITRATE (PF) 100 MCG/2ML IJ SOLN
INTRAMUSCULAR | Status: AC
  Filled 2021-07-17: qty 2

## 2021-07-17 MED ORDER — CLOPIDOGREL BISULFATE 75 MG PO TABS
75.0000 mg | ORAL_TABLET | Freq: Every day | ORAL | Status: DC
  Administered 2021-07-18: 11:00:00 75 mg via ORAL
  Filled 2021-07-17: qty 1

## 2021-07-17 MED ORDER — HEPARIN (PORCINE) IN NACL 1000-0.9 UT/500ML-% IV SOLN
INTRAVENOUS | Status: AC
  Filled 2021-07-17: qty 500

## 2021-07-17 MED ORDER — SODIUM CHLORIDE 0.9% FLUSH: 3.0000 mL | INTRAVENOUS | Status: DC | PRN

## 2021-07-17 MED ORDER — HEPARIN SODIUM (PORCINE) 1000 UNIT/ML IJ SOLN
INTRAMUSCULAR | Status: DC | PRN
  Administered 2021-07-17: 3000 [IU] via INTRAVENOUS
  Administered 2021-07-17: 10000 [IU] via INTRAVENOUS

## 2021-07-17 MED ORDER — SODIUM CHLORIDE 0.9% FLUSH
3.0000 mL | Freq: Two times a day (BID) | INTRAVENOUS | Status: DC
  Administered 2021-07-18 (×2): 3 mL via INTRAVENOUS

## 2021-07-17 MED ORDER — FENTANYL CITRATE (PF) 100 MCG/2ML IJ SOLN
INTRAMUSCULAR | Status: DC | PRN
  Administered 2021-07-17: 25 ug via INTRAVENOUS
  Administered 2021-07-17: 50 ug via INTRAVENOUS
  Administered 2021-07-17: 25 ug via INTRAVENOUS

## 2021-07-17 MED ORDER — ACETAMINOPHEN 325 MG PO TABS
650.0000 mg | ORAL_TABLET | ORAL | Status: DC | PRN
  Administered 2021-07-18: 01:00:00 650 mg via ORAL

## 2021-07-17 MED ORDER — CLOPIDOGREL BISULFATE 300 MG PO TABS
ORAL_TABLET | ORAL | Status: AC
  Filled 2021-07-17: qty 1

## 2021-07-17 MED ORDER — LIDOCAINE HCL (PF) 1 % IJ SOLN
INTRAMUSCULAR | Status: DC | PRN
  Administered 2021-07-17: 15 mL
  Administered 2021-07-17: 4 mL

## 2021-07-17 MED ORDER — OXYCODONE-ACETAMINOPHEN 5-325 MG PO TABS: 2.0000 | ORAL_TABLET | Freq: Four times a day (QID) | ORAL | Status: DC | PRN

## 2021-07-17 MED ORDER — MIDAZOLAM HCL 2 MG/2ML IJ SOLN
INTRAMUSCULAR | Status: DC | PRN
  Administered 2021-07-17 (×3): 1 mg via INTRAVENOUS

## 2021-07-17 MED ORDER — SODIUM CHLORIDE 0.9 % IV SOLN: INTRAVENOUS | Status: DC

## 2021-07-17 MED ORDER — CLOPIDOGREL BISULFATE 300 MG PO TABS
ORAL_TABLET | ORAL | Status: DC | PRN
  Administered 2021-07-17: 300 mg via ORAL

## 2021-07-17 SURGICAL SUPPLY — 30 items
BAG SNAP BAND KOVER 36X36 (MISCELLANEOUS) ×2 IMPLANT
BALLN STERLING OTW 4X220X150 (BALLOONS) ×3
BALLN STERLING OTW 6X220X150 (BALLOONS) ×3
BALLOON STERLING OTW 4X220X150 (BALLOONS) IMPLANT
BALLOON STERLING OTW 6X220X150 (BALLOONS) IMPLANT
CATH CXI 2.6F 90 ANG (CATHETERS) ×3
CATH OMNI FLUSH 5F 65CM (CATHETERS) ×2 IMPLANT
CATH QUICKCROSS SUPP .035X90CM (MICROCATHETER) ×2 IMPLANT
CATH SPRT ANG 90X2.3FR ACPT (CATHETERS) IMPLANT
DEVICE ONE SNARE 10MM (MISCELLANEOUS) ×2 IMPLANT
DEVICE TORQUE H2O (MISCELLANEOUS) ×2 IMPLANT
DEVICE VASC CLSR CELT ART 6 (Vascular Products) ×2 IMPLANT
GUIDEWIRE ANGLED .035X150CM (WIRE) ×2 IMPLANT
KIT ANGIASSIST CO2 SYSTEM (KITS) ×2 IMPLANT
KIT MICROPUNCTURE NIT STIFF (SHEATH) ×2 IMPLANT
KIT PV (KITS) ×3 IMPLANT
SHEATH GLIDE SLENDER 4/5FR (SHEATH) ×2 IMPLANT
SHEATH PINNACLE 5F 10CM (SHEATH) ×2 IMPLANT
SHEATH PINNACLE 6F 10CM (SHEATH) ×2 IMPLANT
SHEATH PINNACLE MP 6F 45CM (SHEATH) ×2 IMPLANT
SHEATH PROBE COVER 6X72 (BAG) ×2 IMPLANT
STENT ELUVIA 7X100X130 (Permanent Stent) ×2 IMPLANT
STENT ELUVIA 7X150X130 (Permanent Stent) ×2 IMPLANT
STENT ELUVIA 7X40X130 (Permanent Stent) ×2 IMPLANT
STENT ELUVIA 7X60X130 (Permanent Stent) ×2 IMPLANT
SYR MEDRAD MARK 7 150ML (SYRINGE) ×3 IMPLANT
TRANSDUCER W/STOPCOCK (MISCELLANEOUS) ×3 IMPLANT
TRAY PV CATH (CUSTOM PROCEDURE TRAY) ×3 IMPLANT
WIRE BENTSON .035X145CM (WIRE) ×2 IMPLANT
WIRE G V18X300CM (WIRE) ×2 IMPLANT

## 2021-07-17 NOTE — Progress Notes (Signed)
Inpatient Diabetes Program Recommendations  AACE/ADA: New Consensus Statement on Inpatient Glycemic Control (2015)  Target Ranges:  Prepandial:   less than 140 mg/dL      Peak postprandial:   less than 180 mg/dL (1-2 hours)      Critically ill patients:  140 - 180 mg/dL   Lab Results  Component Value Date   GLUCAP 236 (H) 07/17/2021   HGBA1C 10.4 (H) 07/15/2021    Review of Glycemic Control  Diabetes history: type 2 Outpatient Diabetes medications: U-500 insulin 90 units TID, Novolog 30 units TID, Tradjenta 5 mg daily Current orders for Inpatient glycemic control: Novolog SENSITIVE correction scale every 4 hours.  Inpatient Diabetes Program Recommendations:   Spokd with patient about his diabetes. Was diagnosed in 2012. Started on insulin in 2015. Was taking Lantus 120 units TID at that time. Currently taking U-500 insulin 90 units TID which he has been on for about 3 years. He also takes Novolog 30 units TID with meals. States that he forgets to take all of his medications until later in the day on many days. He is busy taking care of his 71 year old mother whom he lives with. He states that he gets confused from time to time. Discussed his HgbA1C of 10.4%. states that his A1C was 12% not too long ago. He saw his PCP 2 weeks ago and will see them again in about 1 month. He checks blood sugars X3 per day. States that he does not have low blood sugars at home.  He states that he has an aide that stays with them during the day and then family lives right behind them and checks on them in the evening. States that he is responsible for the food that he and his mother eat. They eat mostly frozen foods or get food brought in to them.  Will continue to monitor blood sugars while in the hospital.  Harvel Ricks RN BSN CDE Diabetes Coordinator Pager: [phone]  8am-5pm

## 2021-07-17 NOTE — Op Note (Signed)
Patient name: [name] MRN: [id number] DOB: February 18, 1956 Sex: male  07/17/2021 Pre-operative Diagnosis: Nonhealing left foot wound Post-operative diagnosis:  Same Surgeon:  [name] Procedure Performed:  1.  Ultrasound-guided access, right femoral artery  2.  Ultrasound-guided access, left dorsalis pedis artery  3.  Abdominal aortogram with CO2  4.  Left lower extremity runoff  5.  Stent, left superficial femoral artery  6.  Conscious sedation, 105 minutes     Indications: This is a 65 year old gentleman with a nonhealing transmetatarsal amputation.  He comes in today for angiographic evaluation.  Procedure:  The patient was identified in the holding area and taken to room 8.  The patient was then placed supine on the table and prepped and draped in the usual sterile fashion.  A time out was called.  Conscious sedation was administered with the use of IV fentanyl and Versed under continuous physician and nurse monitoring.  Heart rate, blood pressure, and oxygen saturation were continuously monitored.  Total sedation time was 105 minutes.  Ultrasound was used to evaluate the right common femoral artery.  It was patent .  A digital ultrasound image was acquired.  A micropuncture needle was used to access the right common femoral artery under ultrasound guidance.  An 018 wire was advanced without resistance and a micropuncture sheath was placed.  The 018 wire was removed and a benson wire was placed.  The micropuncture sheath was exchanged for a 5 french sheath.  An omniflush catheter was advanced over the wire to the level of L-1.  An abdominal angiogram was obtained.  Next, using the omniflush catheter and a benson wire, the aortic bifurcation was crossed and the catheter was placed into theleft external iliac artery and left runoff was obtained.    Findings:   Aortogram: No significant renal artery stenosis was identified.  The infrarenal abdominal aorta is widely patent.   Bilateral common and external iliac arteries are widely patent.  Right Lower Extremity: Not evaluated due to renal issues  Left Lower Extremity: The left common femoral and profundofemoral artery are widely patent.  The superficial femoral artery is occluded just beyond its origin with reconstitution of the adductor canal.  The popliteal artery below this is widely patent with three-vessel runoff.  Intervention: After the above images were acquired the decision was made to proceed with intervention.  A 6 French 45 cm sheath was advanced into the left external iliac artery.  The patient was fully heparinized.  Using an [place] and a quick cross, I attempted to cross the occlusion however ended up in a dissection plane and could not get back to reentry.  Therefore I elected to proceed with pedal access.  The left dorsalis pedis was evaluated with ultrasound was large artery.  It was cannulated under ultrasound guidance with a micropuncture needle.  A [place] wire was then inserted followed by 5 French slender sheath.  Using a V-18 wire and a CXI catheter.  I was able to advance the wire through the occlusion.  It was snared with a 10 mm snare and brought through the sheath.  I then had through and through access.  A 4 x 220 Sterling balloon was used to predilate the lesion.  I then deployed overlapping 7 mm Elluvia stents to treat the stenosis.  These were postdilated with a 6 mm balloon.  Completion imaging showed widely patent superficial femoral artery with no change in runoff.  The right groin was closed with a  [place].  Manual pressure was held on the foot for hemostasis of the dorsalis pedis access.  Impression:  #1  Left superficial femoral artery occlusion, successfully crossed and stented using 7 mm overlapping Elluvia stents.  This did require pedal access.  #2  Three-vessel runoff to the left foot  #3  A total of 60 cc of contrast were utilized for the procedure.    Theotis Burrow,  M.D., [facility] [place] Office: [phone] Pager:  [phone]

## 2021-07-17 NOTE — Progress Notes (Signed)
PROGRESS NOTE  [name] BPZ:[id number] DOB: 06/05/1956 DOA: 07/15/2021 PCP: Carolee Rota, NP   LOS: 2 days   Brief Narrative / Interim history:  65 y.o. male with medical history significant of COPD, chronic hypoxic respiratory failure on home oxygen, polysubstance abuse (tobacco and cocaine), CAD status post PCI, anxiety, depression, BPH, hypertension, GERD, CVA, hyperlipidemia, renal cell carcinoma status post radical nephrectomy in 2015, OSA, medical noncompliance, poorly controlled insulin-dependent type 2 diabetes, status post transmetatarsal amputation of the left foot due to osteomyelitis followed by Dr. Posey Pronto from podiatry.  On 12/21 he was seen in office and recommended to be admitted for IV antibiotics as well as surgical management but patient refused due to the holidays.  He presented and was admitted on 12/27.  Podiatry consulted.  Subjective / 24h Interval events: No complaints.  Denies any shortness of breath.  No chest pain.  No fever or chills  Assessment & Plan: Principal Problem Acute on chronic osteomyelitis, foot abscess-MRI done this admission showed osteomyelitis of the second, third, fourth and fifth metatarsals.  There is also a small 9 mm fluid collection along the plantar aspect of the third metatarsal stump concerning for abscess.  Podiatry consulted, however foot may not be salvageable at all.  Underwent ABIs yesterday which showed worsening peripheral vascular disease.  Vascular surgery consulted, to undergo arteriogram today, may need BKA versus AKA later on  Active Problems Chronic kidney disease stage IIIb -Baseline creatinine varies but more consistently 1.4-1.9.  Currently 1.8  Poorly controlled insulin-dependent type 2 diabetes, with hyperglycemia -History of medical noncompliance.  A1c 10.4.  Continue sliding scale, CBGs as below  CBG (last 3)  Recent Labs    07/17/21 0029 07/17/21 0407 07/17/21 0842  GLUCAP 281* 227* 236*    Diabetic  neuropathy -Continue gabapentin and pregabalin   COPD, chronic hypoxic respiratory failure -No signs of acute exacerbation.  Stable on 6 L home oxygen.  Continue home regimen   CAD status post PCI -Stable.  No chest pain.  Hold Plavix in anticipation for surgery   Hypertension-Continue Cardizem, Imdur   Hyperlipidemia -Continue Lipitor   Anxiety -Continue Xanax as needed   GERD -Continue PPI   BPH -Continue Flomax  Scheduled Meds:  atorvastatin  20 mg Oral QHS   diltiazem  180 mg Oral Daily   fluticasone furoate-vilanterol  1 puff Inhalation Daily   gabapentin  900 mg Oral TID   insulin aspart  0-9 Units Subcutaneous Q4H   isosorbide mononitrate  60 mg Oral Daily   pantoprazole  40 mg Oral Daily   pregabalin  25 mg Oral BID   tamsulosin  0.4 mg Oral Daily   umeclidinium bromide  1 puff Inhalation Daily   Continuous Infusions:  sodium chloride 75 mL/hr at 07/17/21 0541   PRN Meds:.acetaminophen **OR** acetaminophen, albuterol, ALPRAZolam  Diet Orders (From admission, onward)     Start     Ordered   07/17/21 0420  Diet NPO time specified  Diet effective midnight        07/17/21 0419            DVT prophylaxis: SCDs Start: 07/16/21 0013     Code Status: DNR  Family Communication: no family at bedside   Status is: Inpatient  Remains inpatient appropriate because: pending surgery   Level of care: Med-Surg  Consultants:  Podiatry   Procedures:  none  Microbiology  none  Antimicrobials: none    Objective: Vitals:   07/17/21 0405 07/17/21 0542  07/17/21 0839 07/17/21 0840  BP: 107/64  95/67   Pulse: 65  71 66  Resp: 16  18   Temp: 98 F (36.7 C)  (!) 97.4 F (36.3 C)   TempSrc: Oral  Oral   SpO2: (!) 85%  91% 92%  Weight:  98.6 kg    Height:  (!) 5.9" (0.15 m)      Intake/Output Summary (Last 24 hours) at 07/17/2021 0935 Last data filed at 07/17/2021 0844 Gross per 24 hour  Intake 480 ml  Output --  Net 480 ml   Filed Weights    07/17/21 0542  Weight: 98.6 kg    Examination:  Constitutional: He is in no distress Eyes: Anicteric ENMT: moist mucous membranes Neck: normal, supple Respiratory: Overall distant breath sounds, faint end expiratory wheezing.  No crackles. Cardiovascular: Regular rate and rhythm, no murmurs, no peripheral edema Abdomen: Soft, NT, ND, bowel sounds positive Musculoskeletal: no clubbing / cyanosis.  Skin: no rashes seen Neurologic: non focal   Data Reviewed: I have independently reviewed following labs and imaging studies   CBC: Recent Labs  Lab 07/15/21 1245 07/17/21 0217  WBC 7.6 9.3  NEUTROABS 5.8  --   HGB 12.9* 10.6*  HCT 40.3 33.5*  MCV 84.0 83.8  PLT 284 935    Basic Metabolic Panel: Recent Labs  Lab 07/15/21 1245 07/17/21 0217  NA 136 134*  K 3.9 3.6  CL 102 100  CO2 25 27  GLUCOSE 290* 247*  BUN 8 12  CREATININE 1.27* 1.80*  CALCIUM 9.8 8.6*    Liver Function Tests: Recent Labs  Lab 07/15/21 1245  AST 12*  ALT 13  ALKPHOS 68  BILITOT 0.3  PROT 7.3  ALBUMIN 3.0*    Coagulation Profile: Recent Labs  Lab 07/15/21 1245  INR 1.0    HbA1C: Recent Labs    07/15/21 1245  HGBA1C 10.4*    CBG: Recent Labs  Lab 07/16/21 1633 07/16/21 2003 07/17/21 0029 07/17/21 0407 07/17/21 0842  GLUCAP 289* 246* 281* 227* 236*     Recent Results (from the past 240 hour(s))  Blood Culture (routine x 2)     Status: None (Preliminary result)   Collection Time: 07/15/21  9:20 PM   Specimen: BLOOD  Result Value Ref Range Status   Specimen Description BLOOD SITE NOT SPECIFIED  Final   Special Requests   Final    BOTTLES DRAWN AEROBIC AND ANAEROBIC Blood Culture adequate volume   Culture   Final    NO GROWTH 2 DAYS Performed at [facility], [place] [address] [place], [place] [address]    Report Status PENDING  Incomplete  Blood Culture (routine x 2)     Status: None (Preliminary result)   Collection Time: 07/15/21  9:30 PM   Specimen:  BLOOD  Result Value Ref Range Status   Specimen Description BLOOD SITE NOT SPECIFIED  Final   Special Requests   Final    BOTTLES DRAWN AEROBIC AND ANAEROBIC Blood Culture adequate volume   Culture   Final    NO GROWTH 2 DAYS Performed at [facility], [place] [address], [place], [place] [address]    Report Status PENDING  Incomplete  Resp Panel by RT-PCR (Flu A&B, Covid)     Status: None   Collection Time: 07/15/21  9:37 PM   Specimen: Nasopharyngeal(NP) swabs in vial transport medium  Result Value Ref Range Status   SARS Coronavirus 2 by RT PCR NEGATIVE NEGATIVE Final  Comment: (NOTE) SARS-CoV-2 target nucleic acids are NOT DETECTED.  The SARS-CoV-2 RNA is generally detectable in upper respiratory specimens during the acute phase of infection. The lowest concentration of SARS-CoV-2 viral copies this assay can detect is 138 copies/mL. A negative result does not preclude SARS-Cov-2 infection and should not be used as the sole basis for treatment or other patient management decisions. A negative result may occur with  improper specimen collection/handling, submission of specimen other than nasopharyngeal swab, presence of viral mutation(s) within the areas targeted by this assay, and inadequate number of viral copies(<138 copies/mL). A negative result must be combined with clinical observations, patient history, and epidemiological information. The expected result is Negative.  Fact Sheet for Patients:  EntrepreneurPulse.com.au  Fact Sheet for Healthcare Providers:  IncredibleEmployment.be  This test is no t yet approved or cleared by the [place] FDA and  has been authorized for detection and/or diagnosis of SARS-CoV-2 by FDA under an Emergency Use Authorization (EUA). This EUA will remain  in effect (meaning this test can be used) for the duration of the COVID-19 declaration under Section 564(b)(1) of the Act, 21 U.S.C.section  360bbb-3(b)(1), unless the authorization is terminated  or revoked sooner.       Influenza A by PCR NEGATIVE NEGATIVE Final   Influenza B by PCR NEGATIVE NEGATIVE Final    Comment: (NOTE) The Xpert Xpress SARS-CoV-2/FLU/RSV plus assay is intended as an aid in the diagnosis of influenza from Nasopharyngeal swab specimens and should not be used as a sole basis for treatment. Nasal washings and aspirates are unacceptable for Xpert Xpress SARS-CoV-2/FLU/RSV testing.  Fact Sheet for Patients: EntrepreneurPulse.com.au  Fact Sheet for Healthcare Providers: IncredibleEmployment.be  This test is not yet approved or cleared by the [place] FDA and has been authorized for detection and/or diagnosis of SARS-CoV-2 by FDA under an Emergency Use Authorization (EUA). This EUA will remain in effect (meaning this test can be used) for the duration of the COVID-19 declaration under Section 564(b)(1) of the Act, 21 U.S.C. section 360bbb-3(b)(1), unless the authorization is terminated or revoked.  Performed at [facility], [place] [address], [place], [place] [address]       Radiology Studies: VAS [place] ABI WITH/WO TBI  Result Date: 07/16/2021  LOWER EXTREMITY DOPPLER STUDY Patient Name:  [name]  Date of Exam:   07/16/2021 Medical Rec #: [id number]       Accession #:    [id number] Date of Birth: 1955-10-29      Patient Gender: M Patient Age:   22 years Exam Location:  [facility] Procedure:      VAS [place] ABI WITH/WO TBI Referring Phys: ADAM MCDONALD --------------------------------------------------------------------------------  Indications: Peripheral artery disease. High Risk Factors: Hypertension, hyperlipidemia, Diabetes, current smoker.  Vascular Interventions: 12/27/2020 - PARTIAL FIRST RAY AMPUTATION OF FOOT Left. Comparison Study: 12/26/2020 - Right: Resting right ankle-brachial index indicates                   mild right lower                    extremity arterial disease. The right toe-brachial index is                   abnormal.                    Left: Resting left ankle-brachial index indicates moderate  left lower                   extremity arterial disease. Performing Technologist: Carlos Levering RVT  Examination Guidelines: A complete evaluation includes at minimum, Doppler waveform signals and systolic blood pressure reading at the level of bilateral brachial, anterior tibial, and posterior tibial arteries, when vessel segments are accessible. Bilateral testing is considered an integral part of a complete examination. Photoelectric Plethysmograph (PPG) waveforms and toe systolic pressure readings are included as required and additional duplex testing as needed. Limited examinations for reoccurring indications may be performed as noted.  ABI Findings: +---------+------------------+-----+----------+--------+  Right     Rt Pressure (mmHg) Index Waveform   Comment   +---------+------------------+-----+----------+--------+  Brachial  162                      triphasic            +---------+------------------+-----+----------+--------+  PTA       115                0.71  monophasic           +---------+------------------+-----+----------+--------+  DP        105                0.65  monophasic           +---------+------------------+-----+----------+--------+  Great Toe 89                 0.55                       +---------+------------------+-----+----------+--------+ +---------+------------------+-----+----------+----------+  Left      Lt Pressure (mmHg) Index Waveform   Comment     +---------+------------------+-----+----------+----------+  Brachial  139                      monophasic             +---------+------------------+-----+----------+----------+  PTA       105                0.65  monophasic             +---------+------------------+-----+----------+----------+  DP        108                0.67  monophasic              +---------+------------------+-----+----------+----------+  Great Toe                                     Amputation  +---------+------------------+-----+----------+----------+ +-------+-----------+-----------+------------+------------+  ABI/TBI Today's ABI Today's TBI Previous ABI Previous TBI  +-------+-----------+-----------+------------+------------+  Right   0.71        0.55        0.82         0.62          +-------+-----------+-----------+------------+------------+  Left    0.67                    0.76                       +-------+-----------+-----------+------------+------------+  Summary: Right: Resting right ankle-brachial index indicates moderate right lower extremity arterial disease. The right toe-brachial index is abnormal. Left: Resting left ankle-brachial index indicates moderate left lower extremity arterial disease.  Unable to obtain TBI due to first ray amputation.  *See table(s) above for measurements and observations.  Electronically signed by Jamelle Haring on 07/16/2021 at 3:16:13 PM.    Final      Marzetta Board, MD, PhD [facility]  Between 7 am - 7 pm I am available, please contact me via Amion (for emergencies) or Securechat (non urgent messages)  Between 7 pm - 7 am I am not available, please contact night coverage MD/APP via Amion

## 2021-07-17 NOTE — Progress Notes (Addendum)
Patient arrived from cath lab to 4e03, CHG bath completed. Vital signs obtained and patient placed on monitor CCMD made aware. Patient with incisions to foot and groin level 0. Call bell with in reach. Taevin Mcferran, Bettina Gavia RN

## 2021-07-17 NOTE — Interval H&P Note (Signed)
History and Physical Interval Note:  07/17/2021 9:39 AM  [name]  has presented today for surgery, with the diagnosis of osteomyelitis.  The various methods of treatment have been discussed with the patient and family. After consideration of risks, benefits and other options for treatment, the patient has consented to  Procedure(s): ABDOMINAL AORTOGRAM W/LOWER EXTREMITY (N/A) as a surgical intervention.  The patient's history has been reviewed, patient examined, no change in status, stable for surgery.  I have reviewed the patient's chart and labs.  Questions were answered to the patient's satisfaction.     [name]

## 2021-07-18 ENCOUNTER — Encounter (HOSPITAL_BASED_OUTPATIENT_CLINIC_OR_DEPARTMENT_OTHER): Payer: Medicare Other | Admitting: Internal Medicine

## 2021-07-18 DIAGNOSIS — Z95820 Peripheral vascular angioplasty status with implants and grafts: Secondary | ICD-10-CM

## 2021-07-18 LAB — BASIC METABOLIC PANEL
Anion gap: 6 (ref 5–15)
BUN: 13 mg/dL (ref 8–23)
CO2: 24 mmol/L (ref 22–32)
Calcium: 8.3 mg/dL — ABNORMAL LOW (ref 8.9–10.3)
Chloride: 103 mmol/L (ref 98–111)
Creatinine, Ser: 1.5 mg/dL — ABNORMAL HIGH (ref 0.61–1.24)
GFR, Estimated: 51 mL/min — ABNORMAL LOW (ref >60)
Glucose, Bld: 233 mg/dL — ABNORMAL HIGH (ref 70–99)
Potassium: 3.9 mmol/L (ref 3.5–5.1)
Sodium: 133 mmol/L — ABNORMAL LOW (ref 135–145)

## 2021-07-18 LAB — GLUCOSE, CAPILLARY
Glucose-Capillary: 253 mg/dL — ABNORMAL HIGH (ref 70–99)
Glucose-Capillary: 238 mg/dL — ABNORMAL HIGH (ref 70–99)

## 2021-07-18 LAB — CBC
HCT: 33.1 % — ABNORMAL LOW (ref 39.0–52.0)
Hemoglobin: 10.6 g/dL — ABNORMAL LOW (ref 13.0–17.0)
MCH: 26.6 pg (ref 26.0–34.0)
MCHC: 32 g/dL (ref 30.0–36.0)
MCV: 83.2 fL (ref 80.0–100.0)
Platelets: 248 10*3/uL (ref 150–400)
RBC: 3.98 MIL/uL — ABNORMAL LOW (ref 4.22–5.81)
RDW: 13.3 % (ref 11.5–15.5)
WBC: 8.8 10*3/uL (ref 4.0–10.5)
nRBC: 0 % (ref 0.0–0.2)

## 2021-07-18 MED FILL — Heparin Sod (Porcine)-NaCl IV Soln 1000 Unit/500ML-0.9%: INTRAVENOUS | Qty: 1000 | Status: AC

## 2021-07-18 MED FILL — Fentanyl Citrate Preservative Free (PF) Inj 100 MCG/2ML: INTRAMUSCULAR | Qty: 2 | Status: AC

## 2021-07-18 NOTE — Progress Notes (Signed)
Pt signed AMA form and stated he was going home   Phoebe Sharps, RN

## 2021-07-18 NOTE — Discharge Summary (Signed)
Physician AGAINST MEDICAL ADVICE discharge Summary  [name] EPP:[id number] DOB: 1955/10/15 DOA: 07/15/2021  PCP: Carolee Rota, NP  Admit date: 07/15/2021 Discharge date: 07/18/2021  Admitted From: home Disposition: [place] Hospital Course / Discharge diagnoses: Please see today's progress note, patient is here with osteomyelitis and foot abscess based on the MRI.  Podiatry consulted initially with plans for BKA however due to abnormal ABIs vascular surgery was consulted.  He underwent abdominal aortogram and was found to have a superficial femoral artery occlusion on the left lower extremity, status post stenting.  It was felt like foot may be salvageable and ID and podiatry were consulted again, however before he was able to be seen decided to go home and decline further medical treatment. Patient has been warned that this is not Medically advisable at this time, and can result in Medical complications like Death and Disability, patient understands and accepts the risks involved and assumes full responsibilty of this decision.    Discharge Instructions   Allergies as of 07/18/2021       Reactions   Lisinopril Other (See Comments)   Other reaction(s): worsening kidney function   Oxycodone Itching, Nausea Only   Pt is able to tolerate if taken with benadryl     Med Rec must be completed prior to using this [place]  MR FOOT LEFT WO CONTRAST  Result Date: 07/16/2021 CLINICAL DATA:  Foot pain and swelling. EXAM: MRI OF THE LEFT FOOT WITHOUT CONTRAST TECHNIQUE: Multiplanar, multisequence MR imaging of the left foot was performed. No intravenous contrast was administered. COMPARISON:  None. FINDINGS: Bones/Joint/Cartilage Transmetatarsal amputation of the left foot. Irregularity at the amputation site of the metatarsals with bone marrow edema extending into the proximal shaft most severe involving the second, third, fourth and fifth metatarsals. 9 mm  fluid collection along the plantar aspect of the third metatarsal stump concerning for an abscess. Small sliver of fluid along the plantar aspect of the stump of the fourth metatarsal also concerning for an abscess. Normal alignment. No joint effusion. Small full-thickness cartilage fissure of the medial corner of the talar dome with subchondral reactive marrow edema. Ligaments Collateral ligaments are intact.  Lisfranc ligament is intact. Muscles and Tendons Flexor, peroneal and extensor compartment tendons are intact. Generalized muscle atrophy. Soft tissue No other fluid collection or hematoma. No soft tissue mass. Soft tissue wound along the distal stump of the amputation site. IMPRESSION: 1. Transmetatarsal amputation of the left foot. Irregularity at the amputation site of the metatarsals with bone marrow edema extending into the proximal shaft most severe involving the second, third, fourth and fifth metatarsals. Overall appearance is most consistent with osteomyelitis. 2. A 9 mm fluid collection along the plantar aspect of the third metatarsal stump concerning for an abscess. Small sliver of fluid along the plantar aspect of the stump of the fourth metatarsal also concerning for an abscess. Electronically Signed   By: Kathreen Devoid M.D.   On: 07/16/2021 07:23   PERIPHERAL VASCULAR CATHETERIZATION  Result Date: 07/17/2021 Images from the original result were not included. Patient name: [name] MRN: [id number] DOB: [birth year] Sex: male 07/17/2021 Pre-operative Diagnosis: Nonhealing left foot wound Post-operative diagnosis:  Same Surgeon:  [name] Procedure Performed:  1.  Ultrasound-guided access, right femoral artery  2.  Ultrasound-guided access, left dorsalis pedis artery  3.  Abdominal aortogram with CO2  4.  Left lower extremity runoff  5.  Stent, left superficial femoral artery  6.  Conscious sedation, 105 minutes  Indications: This is a 65 year old gentleman with a nonhealing  transmetatarsal amputation.  He comes in today for angiographic evaluation. Procedure:  The patient was identified in the holding area and taken to room 8.  The patient was then placed supine on the table and prepped and draped in the usual sterile fashion.  A time out was called.  Conscious sedation was administered with the use of IV fentanyl and Versed under continuous physician and nurse monitoring.  Heart rate, blood pressure, and oxygen saturation were continuously monitored.  Total sedation time was 105 minutes.  Ultrasound was used to evaluate the right common femoral artery.  It was patent .  A digital ultrasound image was acquired.  A micropuncture needle was used to access the right common femoral artery under ultrasound guidance.  An 018 wire was advanced without resistance and a micropuncture sheath was placed.  The 018 wire was removed and a benson wire was placed.  The micropuncture sheath was exchanged for a 5 french sheath.  An omniflush catheter was advanced over the wire to the level of L-1.  An abdominal angiogram was obtained.  Next, using the omniflush catheter and a benson wire, the aortic bifurcation was crossed and the catheter was placed into theleft external iliac artery and left runoff was obtained.  Findings:  Aortogram: No significant renal artery stenosis was identified.  The infrarenal abdominal aorta is widely patent.  Bilateral common and external iliac arteries are widely patent.  Right Lower Extremity: Not evaluated due to renal issues  Left Lower Extremity: The left common femoral and profundofemoral artery are widely patent.  The superficial femoral artery is occluded just beyond its origin with reconstitution of the adductor canal.  The popliteal artery below this is widely patent with three-vessel runoff. Intervention: After the above images were acquired the decision was made to proceed with intervention.  A 6 French 45 cm sheath was advanced into the left external iliac  artery.  The patient was fully heparinized.  Using an [place] and a quick cross, I attempted to cross the occlusion however ended up in a dissection plane and could not get back to reentry.  Therefore I elected to proceed with pedal access. The left dorsalis pedis was evaluated with ultrasound was large artery.  It was cannulated under ultrasound guidance with a micropuncture needle.  A [place] wire was then inserted followed by 5 French slender sheath.  Using a V-18 wire and a CXI catheter.  I was able to advance the wire through the occlusion.  It was snared with a 10 mm snare and brought through the sheath.  I then had through and through access. A 4 x 220 [place] balloon was used to predilate the lesion.  I then deployed overlapping 7 mm [place] stents to treat the stenosis.  These were postdilated with a 6 mm balloon.  Completion imaging showed widely patent superficial femoral artery with no change in runoff. The right groin was closed with a [place].  Manual pressure was held on the foot for hemostasis of the dorsalis pedis access. Impression:  #1  Left superficial femoral artery occlusion, successfully crossed and stented using 7 mm overlapping [place]uv[place] stents.  This did require pedal access.  #2  Three-vessel runoff to the left foot  #3  A total of 60 cc of contrast were utilized for the procedure. Theotis Burrow, M.D., [facility] [place] Office: [phone] Pager:  [phone]  DG Foot Complete Left  Result Date: 07/15/2021 CLINICAL DATA:  Pain in left foot.  Status post forefoot amputation. EXAM: LEFT FOOT - COMPLETE 3+ VIEW COMPARISON:  06/18/2021 FINDINGS: The patient is status post forefoot amputation at the level of the mid shaft of the metatarsal bones. The surgical margin of the second metatarsal bone is irregular with underlying erosive changes which is concerning for osteomyelitis. Bony fragmentation and exuberant periosteal reaction is identified. There  is also irregularity along the surgical margin of the fifth metatarsal bone. Diffuse soft tissue swelling appears increased from previous exam. No definite soft tissue gas identified. Unchanged appearance of fracture deformity at the base of the fifth metatarsal bone. IMPRESSION: 1. Findings concerning for osteomyelitis involving the surgical margin of the second and fifth metatarsal bones. 2. Increase in diffuse soft tissue swelling. No soft tissue gas identified. 3. Stable appearance of fracture deformity at the base of the fifth metatarsal bone. Electronically Signed   By: Kerby Moors M.D.   On: 07/15/2021 13:21   VAS [place] ABI WITH/WO TBI  Result Date: 07/16/2021  LOWER EXTREMITY DOPPLER STUDY Patient Name:  DEKLEN POPELKA  Date of Exam:   07/16/2021 Medical Rec #: [id number]       Accession #:    [id number] Date of Birth: 1956/02/17      Patient Gender: M Patient Age:   65 years Exam Location:  [facility] Procedure:      VAS [place] ABI WITH/WO TBI Referring Phys: ADAM MCDONALD --------------------------------------------------------------------------------  Indications: Peripheral artery disease. High Risk Factors: Hypertension, hyperlipidemia, Diabetes, current smoker.  Vascular Interventions: 12/27/2020 - PARTIAL FIRST RAY AMPUTATION OF FOOT Left. Comparison Study: 12/26/2020 - Right: Resting right ankle-brachial index indicates                   mild right lower                   extremity arterial disease. The right toe-brachial index is                   abnormal.                    Left: Resting left ankle-brachial index indicates moderate                   left lower                   extremity arterial disease. Performing Technologist: Carlos Levering RVT  Examination Guidelines: A complete evaluation includes at minimum, Doppler waveform signals and systolic blood pressure reading at the level of bilateral brachial, anterior tibial, and posterior tibial arteries, when vessel segments are  accessible. Bilateral testing is considered an integral part of a complete examination. Photoelectric Plethysmograph (PPG) waveforms and toe systolic pressure readings are included as required and additional duplex testing as needed. Limited examinations for reoccurring indications may be performed as noted.  ABI Findings: +---------+------------------+-----+----------+--------+  Right     Rt Pressure (mmHg) Index Waveform   Comment   +---------+------------------+-----+----------+--------+  Brachial  162                      triphasic            +---------+------------------+-----+----------+--------+  PTA       115                0.71  monophasic           +---------+------------------+-----+----------+--------+  DP        105                0.65  monophasic           +---------+------------------+-----+----------+--------+  Great Toe 89                 0.55                       +---------+------------------+-----+----------+--------+ +---------+------------------+-----+----------+----------+  Left      Lt Pressure (mmHg) Index Waveform   Comment     +---------+------------------+-----+----------+----------+  Brachial  139                      monophasic             +---------+------------------+-----+----------+----------+  PTA       105                0.65  monophasic             +---------+------------------+-----+----------+----------+  DP        108                0.67  monophasic             +---------+------------------+-----+----------+----------+  Great Toe                                     Amputation  +---------+------------------+-----+----------+----------+ +-------+-----------+-----------+------------+------------+  ABI/TBI Today's ABI Today's TBI Previous ABI Previous TBI  +-------+-----------+-----------+------------+------------+  Right   0.71        0.55        0.82         0.62          +-------+-----------+-----------+------------+------------+  Left    0.67                    0.76                        +-------+-----------+-----------+------------+------------+  Summary: Right: Resting right ankle-brachial index indicates moderate right lower extremity arterial disease. The right toe-brachial index is abnormal. Left: Resting left ankle-brachial index indicates moderate left lower extremity arterial disease. Unable to obtain TBI due to first ray amputation.  *See table(s) above for measurements and observations.  Electronically signed by Jamelle Haring on 07/16/2021 at 3:16:13 PM.    Final       The results of significant diagnostics from this hospitalization (including imaging, microbiology, ancillary and laboratory) are listed below for reference.     Microbiology: Recent Results (from the past 240 hour(s))  Blood Culture (routine x 2)     Status: None (Preliminary result)   Collection Time: 07/15/21  9:20 PM   Specimen: BLOOD  Result Value Ref Range Status   Specimen Description BLOOD SITE NOT SPECIFIED  Final   Special Requests   Final    BOTTLES DRAWN AEROBIC AND ANAEROBIC Blood Culture adequate volume   Culture   Final    NO GROWTH 3 DAYS Performed at [facility], [address], [place], [place] [address]    Report Status PENDING  Incomplete  Blood Culture (routine x 2)     Status: None (Preliminary result)   Collection Time: 07/15/21  9:30 PM   Specimen: BLOOD  Result Value Ref Range Status   Specimen Description  BLOOD SITE NOT SPECIFIED  Final   Special Requests   Final    BOTTLES DRAWN AEROBIC AND ANAEROBIC Blood Culture adequate volume   Culture   Final    NO GROWTH 3 DAYS Performed at [facility], [address], [place], [place] [address]    Report Status PENDING  Incomplete  Resp Panel by RT-PCR (Flu A&B, Covid)     Status: None   Collection Time: 07/15/21  9:37 PM   Specimen: Nasopharyngeal(NP) swabs in vial transport medium  Result Value Ref Range Status   SARS Coronavirus 2 by RT PCR NEGATIVE NEGATIVE Final    Comment: (NOTE) SARS-CoV-2  target nucleic acids are NOT DETECTED.  The SARS-CoV-2 RNA is generally detectable in upper respiratory specimens during the acute phase of infection. The lowest concentration of SARS-CoV-2 viral copies this assay can detect is 138 copies/mL. A negative result does not preclude SARS-Cov-2 infection and should not be used as the sole basis for treatment or other patient management decisions. A negative result may occur with  improper specimen collection/handling, submission of specimen other than nasopharyngeal swab, presence of viral mutation(s) within the areas targeted by this assay, and inadequate number of viral copies(<138 copies/mL). A negative result must be combined with clinical observations, patient history, and epidemiological information. The expected result is Negative.  Fact Sheet for Patients:  EntrepreneurPulse.com.au  Fact Sheet for Healthcare Providers:  IncredibleEmployment.be  This test is no t yet approved or cleared by the [place] FDA and  has been authorized for detection and/or diagnosis of SARS-CoV-2 by FDA under an Emergency Use Authorization (EUA). This EUA will remain  in effect (meaning this test can be used) for the duration of the COVID-19 declaration under Section 564(b)(1) of the Act, 21 U.S.C.section 360bbb-3(b)(1), unless the authorization is terminated  or revoked sooner.       Influenza A by PCR NEGATIVE NEGATIVE Final   Influenza B by PCR NEGATIVE NEGATIVE Final    Comment: (NOTE) The Xpert Xpress SARS-CoV-2/FLU/RSV plus assay is intended as an aid in the diagnosis of influenza from Nasopharyngeal swab specimens and should not be used as a sole basis for treatment. Nasal washings and aspirates are unacceptable for Xpert Xpress SARS-CoV-2/FLU/RSV testing.  Fact Sheet for Patients: EntrepreneurPulse.com.au  Fact Sheet for Healthcare  Providers: IncredibleEmployment.be  This test is not yet approved or cleared by the [place] FDA and has been authorized for detection and/or diagnosis of SARS-CoV-2 by FDA under an Emergency Use Authorization (EUA). This EUA will remain in effect (meaning this test can be used) for the duration of the COVID-19 declaration under Section 564(b)(1) of the Act, 21 U.S.C. section 360bbb-3(b)(1), unless the authorization is terminated or revoked.  Performed at [facility], [place] [address] [place], [place] [address]      Labs: Basic Metabolic Panel: Recent Labs  Lab 07/15/21 1245 07/17/21 0217 07/18/21 0132  NA 136 134* 133*  K 3.9 3.6 3.9  CL 102 100 103  CO2 25 27 24   GLUCOSE 290* 247* 233*  BUN 8 12 13   CREATININE 1.27* 1.80* 1.50*  CALCIUM 9.8 8.6* 8.3*   Liver Function Tests: Recent Labs  Lab 07/15/21 1245  AST 12*  ALT 13  ALKPHOS 68  BILITOT 0.3  PROT 7.3  ALBUMIN 3.0*   CBC: Recent Labs  Lab 07/15/21 1245 07/17/21 0217 07/18/21 0132  WBC 7.6 9.3 8.8  NEUTROABS 5.8  --   --   HGB 12.9* 10.6*  10.6*  HCT 40.3 33.5* 33.1*  MCV 84.0 83.8 83.2  PLT 284 276 248   CBG: Recent Labs  Lab 07/17/21 1622 07/17/21 2047 07/17/21 2335 07/18/21 0406 07/18/21 1044  GLUCAP 218* 315* 235* 253* 238*   Hgb A1c No results for input(s): HGBA1C in the last 72 hours. Lipid Profile No results for input(s): CHOL, HDL, LDLCALC, TRIG, CHOLHDL, LDLDIRECT in the last 72 hours. Thyroid function studies No results for input(s): TSH, T4TOTAL, T3FREE, THYROIDAB in the last 72 hours.  Invalid input(s): FREET3 Urinalysis    Component Value Date/Time   COLORURINE COLORLESS (A) 12/24/2020 2008   APPEARANCEUR CLEAR 12/24/2020 2008   LABSPEC 1.006 12/24/2020 2008   PHURINE 5.0 12/24/2020 2008   GLUCOSEU NEGATIVE 12/24/2020 2008   HGBUR NEGATIVE 12/24/2020 2008   BILIRUBINUR NEGATIVE 12/24/2020 2008   [place] NEGATIVE 12/24/2020 2008    PROTEINUR NEGATIVE 12/24/2020 2008   UROBILINOGEN 0.2 02/28/2015 1310   NITRITE NEGATIVE 12/24/2020 2008   LEUKOCYTESUR NEGATIVE 12/24/2020 2008    FURTHER DISCHARGE INSTRUCTIONS:   Get Medicines reviewed and adjusted: Please take all your medications with you for your next visit with your Primary MD   Laboratory/radiological data: Please request your Primary MD to go over all hospital tests and procedure/radiological results at the follow up, please ask your Primary MD to get all Hospital records sent to his/her office.   In some cases, they will be blood work, cultures and biopsy results pending at the time of your discharge. Please request that your primary care M.D. goes through all the records of your hospital data and follows up on these results.   Also Note the following: If you experience worsening of your admission symptoms, develop shortness of breath, life threatening emergency, suicidal or homicidal thoughts you must seek medical attention immediately by calling 911 or calling your MD immediately  if symptoms less severe.   You must read complete instructions/literature along with all the possible adverse reactions/side effects for all the Medicines you take and that have been prescribed to you. Take any new Medicines after you have completely understood and accpet all the possible adverse reactions/side effects.    Do not drive when taking Pain medications or sleeping medications (Benzodaizepines)   Do not take more than prescribed Pain, Sleep and Anxiety Medications. It is not advisable to combine anxiety,sleep and pain medications without talking with your primary care practitioner   Special Instructions: If you have smoked or chewed Tobacco  in the last 2 yrs please stop smoking, stop any regular Alcohol  and or any Recreational drug use.   Wear Seat belts while driving.   Please note: You were cared for by a hospitalist during your hospital stay. Once you are  discharged, your primary care physician will handle any further medical issues. Please note that NO REFILLS for any discharge medications will be authorized once you are discharged, as it is imperative that you return to your primary care physician (or establish a relationship with a primary care physician if you do not have one) for your post hospital discharge needs so that they can reassess your need for medications and monitor your lab values.   SIGNED:  Marzetta Board, MD, PhD 07/18/2021, 4:01 PM

## 2021-07-18 NOTE — Progress Notes (Signed)
PROGRESS NOTE  [name] MWN:[id number] DOB: August 05, 1955 DOA: 07/15/2021 PCP: Carolee Rota, NP   LOS: 3 days   Brief Narrative / Interim history:  65 y.o. male with medical history significant of COPD, chronic hypoxic respiratory failure on home oxygen, polysubstance abuse (tobacco and cocaine), CAD status post PCI, anxiety, depression, BPH, hypertension, GERD, CVA, hyperlipidemia, renal cell carcinoma status post radical nephrectomy in 2015, OSA, medical noncompliance, poorly controlled insulin-dependent type 2 diabetes, status post transmetatarsal amputation of the left foot due to osteomyelitis followed by Dr. Posey Pronto from podiatry.  On 12/21 he was seen in office and recommended to be admitted for IV antibiotics as well as surgical management but patient refused due to the holidays.  He presented and was admitted on 12/27.  Podiatry consulted.  Subjective / 24h Interval events: No complaints.  He is begging me to let him go home.  Tells me he wants to be home for the holidays.  Assessment & Plan: Principal Problem Acute on chronic osteomyelitis, foot abscess-MRI done this admission showed osteomyelitis of the second, third, fourth and fifth metatarsals.  There is also a small 9 mm fluid collection along the plantar aspect of the third metatarsal stump concerning for abscess.  Podiatry consulted, however foot may not be salvageable at all.  Underwent ABIs which showed worsening peripheral vascular disease.  Vascular surgery consulted.  He underwent abdominal aortogram and had superficial femoral artery occlusion on the left lower extremity, status post stenting.  Blood flow improved significantly post procedurally, and vascular surgery thinks foot may be salvageable.  Podiatry to see and evaluate again. -ID consulted as well -Patient really insistent that he goes home today.  I do not think that that is realistic.  ID needs to weigh in, he will also need IV antibiotics.  Ideally he is best  served he has culture targeted treatment and will need some form of intervention/I&D before even knowing which antibiotics are needed.  Active Problems Chronic kidney disease stage IIIb -Baseline creatinine varies but more consistently 1.4-1.9.  Currently at baseline at 1.5.  Poorly controlled insulin-dependent type 2 diabetes, with hyperglycemia -History of medical noncompliance.  A1c 10.4.  Continue sliding scale, CBGs as below  CBG (last 3)  Recent Labs    07/17/21 2335 07/18/21 0406 07/18/21 1044  GLUCAP 235* 253* 238*    Diabetic neuropathy -Continue gabapentin and pregabalin   COPD, chronic hypoxic respiratory failure -No signs of acute exacerbation.  Stable on 6 L home oxygen.  Continue home regimen   CAD status post PCI -Stable.  No chest pain.  Hold Plavix in anticipation for surgery   Hypertension-Continue Cardizem, Imdur   Hyperlipidemia -Continue Lipitor   Anxiety -Continue Xanax as needed   GERD -Continue PPI   BPH -Continue Flomax  Scheduled Meds:  aspirin EC  81 mg Oral Daily   atorvastatin  20 mg Oral QHS   clopidogrel  75 mg Oral Q breakfast   diltiazem  180 mg Oral Daily   fluticasone furoate-vilanterol  1 puff Inhalation Daily   gabapentin  900 mg Oral TID   insulin aspart  0-9 Units Subcutaneous Q4H   isosorbide mononitrate  60 mg Oral Daily   pantoprazole  40 mg Oral Daily   pregabalin  25 mg Oral BID   sodium chloride flush  3 mL Intravenous Q12H   tamsulosin  0.4 mg Oral Daily   umeclidinium bromide  1 puff Inhalation Daily   Continuous Infusions:  sodium chloride 75 mL/hr at  07/17/21 0541   sodium chloride     PRN Meds:.sodium chloride, acetaminophen **OR** acetaminophen, acetaminophen, albuterol, ALPRAZolam, hydrALAZINE, labetalol, ondansetron (ZOFRAN) IV, oxyCODONE-acetaminophen **OR** oxyCODONE-acetaminophen, sodium chloride flush  Diet Orders (From admission, onward)     Start     Ordered   07/17/21 1547  Diet regular Room service  appropriate? Yes; Fluid consistency: Thin  Diet effective now       Question Answer Comment  Room service appropriate? Yes   Fluid consistency: Thin      07/17/21 1546            DVT prophylaxis: SCDs Start: 07/16/21 0013     Code Status: DNR  Family Communication: no family at bedside   Status is: Inpatient  Remains inpatient appropriate because: pending surgery   Level of care: Progressive Cardiac  Consultants:  Podiatry   Procedures:  none  Microbiology  none  Antimicrobials: none    Objective: Vitals:   07/17/21 2100 07/17/21 2332 07/18/21 0404 07/18/21 0849  BP:  103/81  110/76  Pulse:  80 71 87  Resp: 20 20 20  (!) 23  Temp:  97.8 F (36.6 C) 98.1 F (36.7 C) 97.9 F (36.6 C)  TempSrc:  Oral Oral Oral  SpO2:  95%  95%  Weight:      Height:        Intake/Output Summary (Last 24 hours) at 07/18/2021 1339 Last data filed at 07/18/2021 7425 Gross per 24 hour  Intake 2060 ml  Output 850 ml  Net 1210 ml    Filed Weights   07/17/21 0542 07/17/21 1244  Weight: 98.6 kg 102 kg    Examination:  Constitutional: NAD Eyes: No scleral icterus ENMT: Moist mucous membranes Neck: normal, supple Respiratory: Distant breath sounds, faint end expiratory wheezing Cardiovascular: Regular rate and rhythm, no edema Abdomen: Soft, nontender, nondistended, bowel sounds positive Musculoskeletal: no clubbing / cyanosis.  Skin: No rashes seen Neurologic: No focal deficits  Data Reviewed: I have independently reviewed following labs and imaging studies   CBC: Recent Labs  Lab 07/15/21 1245 07/17/21 0217 07/18/21 0132  WBC 7.6 9.3 8.8  NEUTROABS 5.8  --   --   HGB 12.9* 10.6* 10.6*  HCT 40.3 33.5* 33.1*  MCV 84.0 83.8 83.2  PLT 284 276 956    Basic Metabolic Panel: Recent Labs  Lab 07/15/21 1245 07/17/21 0217 07/18/21 0132  NA 136 134* 133*  K 3.9 3.6 3.9  CL 102 100 103  CO2 25 27 24   GLUCOSE 290* 247* 233*  BUN 8 12 13   CREATININE  1.27* 1.80* 1.50*  CALCIUM 9.8 8.6* 8.3*    Liver Function Tests: Recent Labs  Lab 07/15/21 1245  AST 12*  ALT 13  ALKPHOS 68  BILITOT 0.3  PROT 7.3  ALBUMIN 3.0*    Coagulation Profile: Recent Labs  Lab 07/15/21 1245  INR 1.0    HbA1C: No results for input(s): HGBA1C in the last 72 hours.  CBG: Recent Labs  Lab 07/17/21 1622 07/17/21 2047 07/17/21 2335 07/18/21 0406 07/18/21 1044  GLUCAP 218* 315* 235* 253* 238*     Recent Results (from the past 240 hour(s))  Blood Culture (routine x 2)     Status: None (Preliminary result)   Collection Time: 07/15/21  9:20 PM   Specimen: BLOOD  Result Value Ref Range Status   Specimen Description BLOOD SITE NOT SPECIFIED  Final   Special Requests   Final    BOTTLES DRAWN AEROBIC AND ANAEROBIC Blood  Culture adequate volume   Culture   Final    NO GROWTH 3 DAYS Performed at [facility], [place] [address], [place], [place] [address]    Report Status PENDING  Incomplete  Blood Culture (routine x 2)     Status: None (Preliminary result)   Collection Time: 07/15/21  9:30 PM   Specimen: BLOOD  Result Value Ref Range Status   Specimen Description BLOOD SITE NOT SPECIFIED  Final   Special Requests   Final    BOTTLES DRAWN AEROBIC AND ANAEROBIC Blood Culture adequate volume   Culture   Final    NO GROWTH 3 DAYS Performed at [facility], [address] [place], [place] [address]    Report Status PENDING  Incomplete  Resp Panel by RT-PCR (Flu A&B, Covid)     Status: None   Collection Time: 07/15/21  9:37 PM   Specimen: Nasopharyngeal(NP) swabs in vial transport medium  Result Value Ref Range Status   SARS Coronavirus 2 by RT PCR NEGATIVE NEGATIVE Final    Comment: (NOTE) SARS-CoV-2 target nucleic acids are NOT DETECTED.  The SARS-CoV-2 RNA is generally detectable in upper respiratory specimens during the acute phase of infection. The lowest concentration of SARS-CoV-2 viral copies this assay can detect  is 138 copies/mL. A negative result does not preclude SARS-Cov-2 infection and should not be used as the sole basis for treatment or other patient management decisions. A negative result may occur with  improper specimen collection/handling, submission of specimen other than nasopharyngeal swab, presence of viral mutation(s) within the areas targeted by this assay, and inadequate number of viral copies(<138 copies/mL). A negative result must be combined with clinical observations, patient history, and epidemiological information. The expected result is Negative.  Fact Sheet for Patients:  EntrepreneurPulse.com.au  Fact Sheet for Healthcare Providers:  IncredibleEmployment.be  This test is no t yet approved or cleared by the [place] FDA and  has been authorized for detection and/or diagnosis of SARS-CoV-2 by FDA under an Emergency Use Authorization (EUA). This EUA will remain  in effect (meaning this test can be used) for the duration of the COVID-19 declaration under Section 564(b)(1) of the Act, 21 U.S.C.section 360bbb-3(b)(1), unless the authorization is terminated  or revoked sooner.       Influenza A by PCR NEGATIVE NEGATIVE Final   Influenza B by PCR NEGATIVE NEGATIVE Final    Comment: (NOTE) The Xpert Xpress SARS-CoV-2/FLU/RSV plus assay is intended as an aid in the diagnosis of influenza from Nasopharyngeal swab specimens and should not be used as a sole basis for treatment. Nasal washings and aspirates are unacceptable for Xpert Xpress SARS-CoV-2/FLU/RSV testing.  Fact Sheet for Patients: EntrepreneurPulse.com.au  Fact Sheet for Healthcare Providers: IncredibleEmployment.be  This test is not yet approved or cleared by the [place] FDA and has been authorized for detection and/or diagnosis of SARS-CoV-2 by FDA under an Emergency Use Authorization (EUA). This EUA will remain in effect  (meaning this test can be used) for the duration of the COVID-19 declaration under Section 564(b)(1) of the Act, 21 U.S.C. section 360bbb-3(b)(1), unless the authorization is terminated or revoked.  Performed at [facility], [place] [address] [place], [place] [address]       Radiology Studies: No results found.   Marzetta Board, MD, PhD [facility]  Between 7 am - 7 pm I am available, please contact me via Amion (for emergencies) or Securechat (non urgent messages)  Between 7 pm -  7 am I am not available, please contact night coverage MD/APP via Amion

## 2021-07-18 NOTE — Progress Notes (Addendum)
°  Progress Note    07/18/2021 7:01 AM 1 Day Post-Op  Subjective:  c/o soreness on the upper inner thigh on the left, otherwise feels good  afebrile  Vitals:   07/17/21 2332 07/18/21 0404  BP: 103/81   Pulse: 80 71  Resp: 20 20  Temp: 97.8 F (36.6 C) 98.1 F (36.7 C)  SpO2: 95%     Physical Exam: Cardiac:  regular Lungs:  non labored Incisions:  right groin is soft without hematoma; bandage on dorsum of left foot clean and dry Extremities:  +doppler signal left PT   CBC    Component Value Date/Time   WBC 8.8 07/18/2021 0132   RBC 3.98 (L) 07/18/2021 0132   HGB 10.6 (L) 07/18/2021 0132   HCT 33.1 (L) 07/18/2021 0132   PLT 248 07/18/2021 0132   MCV 83.2 07/18/2021 0132   MCH 26.6 07/18/2021 0132   MCHC 32.0 07/18/2021 0132   RDW 13.3 07/18/2021 0132   LYMPHSABS 1.3 07/15/2021 1245   MONOABS 0.4 07/15/2021 1245   EOSABS 0.1 07/15/2021 1245   BASOSABS 0.0 07/15/2021 1245    BMET    Component Value Date/Time   NA 133 (L) 07/18/2021 0132   K 3.9 07/18/2021 0132   CL 103 07/18/2021 0132   CO2 24 07/18/2021 0132   GLUCOSE 233 (H) 07/18/2021 0132   BUN 13 07/18/2021 0132   CREATININE 1.50 (H) 07/18/2021 0132   CREATININE 1.89 (H) 04/24/2021 1524   CALCIUM 8.3 (L) 07/18/2021 0132   GFRNONAA 51 (L) 07/18/2021 0132   GFRAA 42 (L) 04/12/2020 1348    INR    Component Value Date/Time   INR 1.0 07/15/2021 1245     Intake/Output Summary (Last 24 hours) at 07/18/2021 0701 Last data filed at 07/18/2021 0407 Gross per 24 hour  Intake 1580 ml  Output 850 ml  Net 730 ml     Assessment/Plan:  65 y.o. male is s/p:  Arteriogram with stent to left SFA  1 Day Post-Op   -pt with + doppler signal left PT and right groin is soft -wound management left foot per podiatry -continue asa/plavix/statin -f/u with vascular in 6-8 weeks with LLE arterial duplex and ABI.  He has hx of right CEA in 2016 and occluded left ICA.  Will get carotid duplex when he returns  since he hasn't had one since then.   Leontine Locket, PA-C [facility] [phone] 07/18/2021 7:01 AM   I agree with the above POD #1 Palpable left DP Needs ASA 81mg , plavix and statin. Continue ABX Given significant improvement in blood flow, I would try to salvage his TMA.  If not possible, he would require BKA.  Please ask podiatry to re-evaluate given change in blood flow Needs abx for at least 6 weeks  Wells Kendrell Lottman

## 2021-07-20 LAB — CULTURE, BLOOD (ROUTINE X 2)
Culture: NO GROWTH
Culture: NO GROWTH
Special Requests: ADEQUATE
Special Requests: ADEQUATE

## 2021-07-23 ENCOUNTER — Ambulatory Visit: Payer: Medicare Other | Admitting: Podiatry

## 2021-07-25 DIAGNOSIS — J449 Chronic obstructive pulmonary disease, unspecified: Secondary | ICD-10-CM | POA: Diagnosis not present

## 2021-07-29 DIAGNOSIS — G629 Polyneuropathy, unspecified: Secondary | ICD-10-CM | POA: Diagnosis not present

## 2021-07-29 DIAGNOSIS — J449 Chronic obstructive pulmonary disease, unspecified: Secondary | ICD-10-CM | POA: Diagnosis not present

## 2021-07-29 DIAGNOSIS — Z23 Encounter for immunization: Secondary | ICD-10-CM | POA: Diagnosis not present

## 2021-07-29 DIAGNOSIS — E1140 Type 2 diabetes mellitus with diabetic neuropathy, unspecified: Secondary | ICD-10-CM | POA: Diagnosis not present

## 2021-07-29 DIAGNOSIS — J849 Interstitial pulmonary disease, unspecified: Secondary | ICD-10-CM | POA: Diagnosis not present

## 2021-07-29 DIAGNOSIS — I2510 Atherosclerotic heart disease of native coronary artery without angina pectoris: Secondary | ICD-10-CM | POA: Diagnosis not present

## 2021-07-29 DIAGNOSIS — G8929 Other chronic pain: Secondary | ICD-10-CM | POA: Diagnosis not present

## 2021-07-29 DIAGNOSIS — I739 Peripheral vascular disease, unspecified: Secondary | ICD-10-CM | POA: Diagnosis not present

## 2021-07-29 DIAGNOSIS — Z794 Long term (current) use of insulin: Secondary | ICD-10-CM | POA: Diagnosis not present

## 2021-07-30 ENCOUNTER — Other Ambulatory Visit: Payer: Self-pay

## 2021-07-30 ENCOUNTER — Ambulatory Visit (INDEPENDENT_AMBULATORY_CARE_PROVIDER_SITE_OTHER): Payer: Medicare Other | Admitting: Podiatry

## 2021-07-30 DIAGNOSIS — G8929 Other chronic pain: Secondary | ICD-10-CM | POA: Diagnosis not present

## 2021-07-30 DIAGNOSIS — E1142 Type 2 diabetes mellitus with diabetic polyneuropathy: Secondary | ICD-10-CM | POA: Diagnosis not present

## 2021-07-30 DIAGNOSIS — J449 Chronic obstructive pulmonary disease, unspecified: Secondary | ICD-10-CM | POA: Diagnosis not present

## 2021-07-30 DIAGNOSIS — L97522 Non-pressure chronic ulcer of other part of left foot with fat layer exposed: Secondary | ICD-10-CM

## 2021-07-30 DIAGNOSIS — E1140 Type 2 diabetes mellitus with diabetic neuropathy, unspecified: Secondary | ICD-10-CM | POA: Diagnosis not present

## 2021-07-30 DIAGNOSIS — G629 Polyneuropathy, unspecified: Secondary | ICD-10-CM | POA: Diagnosis not present

## 2021-07-31 DIAGNOSIS — Z794 Long term (current) use of insulin: Secondary | ICD-10-CM | POA: Diagnosis not present

## 2021-07-31 DIAGNOSIS — E1140 Type 2 diabetes mellitus with diabetic neuropathy, unspecified: Secondary | ICD-10-CM | POA: Diagnosis not present

## 2021-08-01 ENCOUNTER — Telehealth: Payer: Self-pay | Admitting: *Deleted

## 2021-08-01 ENCOUNTER — Encounter: Payer: Self-pay | Admitting: Podiatry

## 2021-08-01 ENCOUNTER — Other Ambulatory Visit: Payer: Self-pay | Admitting: *Deleted

## 2021-08-01 ENCOUNTER — Other Ambulatory Visit: Payer: Self-pay | Admitting: Podiatry

## 2021-08-01 NOTE — Telephone Encounter (Signed)
Patient is calling for status of his wound care referral. Returned call back to patient, informing that a referral has been sent to [facility] wound care and they will contact him to schedule appointment. He wanted to let the physician know that per the nurse (Tina)at [facility] the home visits has to be a new order, wanted the nurse to come out daily.

## 2021-08-01 NOTE — Progress Notes (Signed)
Subjective:  Patient ID: [name], male    DOB: March 29, 1956,  MRN: [id number]  Chief Complaint  Patient presents with   Wound Check    Left foot     66 y.o. male presents for wound care. Patient presents with left foot transmetatarsal amputation stump wound.  Patient surgery was on 05/05/2021.  Patient was recently admitted to the hospital for which he did not have any foot surgeries however had more vascular intervention.  He states is doing well.  He would like to know if he should go to the wound care center.  His last A1c was 10.4.  He denies any other acute complaints.     07/17/2021 vascular surgery report:Impression:             #1  Left superficial femoral artery occlusion, successfully crossed and stented using 7 mm overlapping Elluvia stents.  This did require pedal access.             #2  Three-vessel runoff to the left foot             #3  A total of 60 cc of contrast were utilized for the procedure.   Review of Systems: Negative except as noted in the HPI. Denies N/V/F/Ch.  Past Medical History:  Diagnosis Date   Anxiety    Arthritis    Cholecystitis, acute 12/20/2013   Lap chole 6/5   Cocaine abuse ([name])    COPD (chronic obstructive pulmonary disease) ([name])    Coronary atherosclerosis of native coronary artery    a. 03/09/2013 Cath/PCI: LM nl, LAD: 50p, 68m (2.5x16 promus DES), LCX nl, OM1 min irregs, LPL/LPDA diff dzs, RCA nondom, mod diff dzs, EF 55%.   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 ([place]) 04/24/2021   History of nephrolithiasis    History of pneumonia    History of stroke    Right MCA distribution, residual left-sided weakness   Hyperlipidemia    Noncompliance    Osteomyelitis of second toe of left foot ([place]) 04/24/2021   Renal cell carcinoma ([name])    Status post radical right nephrectomy August 2015    Sleep apnea    On CPAP, 4L O2 no cpap at home yet   Type 2 diabetes mellitus ([place]) 2011    Current Outpatient Medications:    acetaminophen (TYLENOL) 325 MG tablet, Take 650 mg by mouth as needed (pain)., Disp: , Rfl:    ALPRAZolam (XANAX) 0.5 MG tablet, Take 1 tablet (0.5 mg total) by mouth 3 (three) times daily as needed for anxiety. (Patient not taking: Reported on 06/05/2021), Disp: , Rfl: 0   ALPRAZolam (XANAX) 1 MG tablet, Take 1 mg by mouth 3 (three) times daily as needed., Disp: , Rfl:    atorvastatin (LIPITOR) 20 MG tablet, Take 20 mg by mouth at bedtime., Disp: , Rfl: 1   azithromycin (ZITHROMAX) 250 MG tablet, Take 2 tablets first day then 1 tablet daily until gone (Patient not taking: Reported on 07/15/2021), Disp: 6 tablet, Rfl: 0   Budeson-Glycopyrrol-Formoterol (BREZTRI AEROSPHERE) 160-9-4.8 MCG/ACT AERO, Inhale 2 puffs into the lungs in the morning and at bedtime., Disp: 10.7 g, Rfl: 5   budesonide (PULMICORT) 1 MG/2ML nebulizer solution, Take 1 mg by nebulization as needed (shortness of breath)., Disp: , Rfl:    clopidogrel (PLAVIX) 75  MG tablet, TAKE ONE TABLET BY MOUTH EVERY DAY (Patient taking differently: Take 75 mg by mouth daily.), Disp: 90 tablet, Rfl: 2   diltiazem (CARDIZEM CD) 180 MG 24 hr capsule, TAKE ONE CAPSULE BY MOUTH EVERY DAY (Patient taking differently: Take 180 mg by mouth daily.), Disp: 90 capsule, Rfl: 2   diphenhydrAMINE (BENADRYL) 25 mg capsule, Take 25 mg by mouth every 6 (six) hours as needed for itching (take with percocet)., Disp: , Rfl:    esomeprazole (NEXIUM) 40 MG capsule, Take 40 mg by mouth at bedtime. Pt only take as needed, Disp: , Rfl:    FLUoxetine (PROZAC) 20 MG capsule, Take 20 mg by mouth daily., Disp: , Rfl:    FLUoxetine (PROZAC) 40 MG capsule, Take 40 mg by mouth daily. Take with 10mg  capsule for a total of 50mg  daily., Disp: , Rfl:    formoterol (PERFOROMIST) 20 MCG/2ML nebulizer solution, Take 20 mcg by nebulization as needed  (shortness of breath)., Disp: , Rfl:    gabapentin (NEURONTIN) 300 MG capsule, Take 900 mg by mouth 3 (three) times daily., Disp: , Rfl:    glucose blood (TRUE METRIX BLOOD GLUCOSE TEST) test strip, , Disp: , Rfl:    insulin regular human CONCENTRATED (HUMULIN R) 500 UNIT/ML injection, Inject 90 Units into the skin 3 (three) times daily with meals., Disp: , Rfl:    isosorbide mononitrate (IMDUR) 60 MG 24 hr tablet, TAKE ONE TABLET BY MOUTH EVERY DAY (Patient taking differently: Take 60 mg by mouth daily.), Disp: 90 tablet, Rfl: 1   linagliptin (TRADJENTA) 5 MG TABS tablet, Take 5 mg by mouth daily., Disp: , Rfl:    nitroGLYCERIN (NITROSTAT) 0.4 MG SL tablet, PLACE ONE tablet UNDER THE TONGUE every FIVE minutes as needed FOR CHEST PAIN. IF 3 TABLETS ARE NECESSARY CALL 911 (Patient taking differently: Place 0.4 mg under the tongue every 5 (five) minutes as needed for chest pain.), Disp: 25 tablet, Rfl: 3   potassium chloride SA (K-DUR,KLOR-CON) 20 MEQ tablet, Take 20 mEq by mouth 2 (two) times daily. , Disp: , Rfl:    pregabalin (LYRICA) 25 MG capsule, Take 25 mg by mouth 2 (two) times daily., Disp: , Rfl:    Probiotic Product (PROBIOTIC PO), Take 1 capsule by mouth daily., Disp: , Rfl:    tamsulosin (FLOMAX) 0.4 MG CAPS capsule, Take 0.4 mg by mouth daily. , Disp: , Rfl:    torsemide (DEMADEX) 20 MG tablet, TAKE THREE TABLETS BY MOUTH TWICE DAILY (Patient taking differently: Take 20 mg by mouth 2 (two) times daily.), Disp: 540 tablet, Rfl: 0   zolpidem (AMBIEN) 5 MG tablet, Take 5 mg by mouth at bedtime. For insomnia., Disp: , Rfl:   Social History   Tobacco Use  Smoking Status Every Day   Packs/day: 1.00   Years: 41.00   Pack years: 41.00   Types: Cigarettes   Start date: 05/03/1972  Smokeless Tobacco Never  Tobacco Comments   1/2 ppd 06/05/21    Allergies  Allergen Reactions   Lisinopril Other (See Comments)    Other reaction(s): worsening kidney function   Oxycodone Itching and  Nausea Only    Pt is able to tolerate if taken with benadryl   Objective:  There were no vitals filed for this visit. There is no height or weight on file to calculate BMI. Constitutional Well developed. Well nourished.  Vascular Dorsalis pedis pulses palpable bilaterally. Posterior tibial pulses palpable bilaterally. Capillary refill normal to all digits.  No cyanosis  or clubbing noted. Pedal hair growth normal.  Neurologic Normal speech. Oriented to person, place, and time. Protective sensation absent  Dermatologic Wound Location: Left transmetatarsal amputation stump site with fat layer exposed.  Overall wound is looking better however 1 centrally that probes down to deep tissue.  No purulent drainage noted.  Fiber granular wound base Wound Base: Mixed Granular/Fibrotic Peri-wound: Calloused Exudate: Scant/small amount Serosanguinous exudate Wound Measurements: -See below  Orthopedic: No pain to palpation either foot.   Radiographs: None Assessment:   1. Foot ulcer, left, with fat layer exposed ([place])   2. Diabetic peripheral neuropathy associated with type 2 diabetes mellitus ([place])    Plan:  Patient was evaluated and treated and all questions answered.  Ulcer left foot ulceration status post transmetatarsal amputation with fat layer exposed -Debridement as below. -Dressed with Betadine wet-to-dry, DSD. -Continue off-loading with surgical shoe. -Referred to the wound care center was placed for [facility]  Procedure: Excisional Debridement of Wound Tool: Sharp chisel blade/tissue nipper Rationale: Removal of non-viable soft tissue from the wound to promote healing.  Anesthesia: none Pre-Debridement Wound Measurements: 1 cm x 1.5 cm x 0.5 cm  Post-Debridement Wound Measurements: 1.2 cm x 1.7 cm x 0.3 cm  Type of Debridement: Sharp Excisional Tissue Removed: Non-viable soft tissue Blood loss: Minimal (<50cc) Depth of Debridement: subcutaneous  tissue. Technique: Sharp excisional debridement to bleeding, viable wound base.  Wound Progress: This is my initial evaluation I will continue monitor the progression of the wound Site healing conversation 7 Dressing: Dry, sterile, compression dressing. Disposition: Patient tolerated procedure well. Patient to return in 1 week for follow-up.  No follow-ups on file.

## 2021-08-04 NOTE — Telephone Encounter (Signed)
[facility] called and said that they are unable to assist patient at this time due to staffing issues in that area.  Called and gave information to his nurse Otila Kluver and she is going to contact them, will call me back if anything changes.

## 2021-08-04 NOTE — Telephone Encounter (Signed)
Tina,nurse w/ [facility] called back and said that they can take patient and asked if we could refax his order. Refaxed along with last office visit note and demographics.

## 2021-08-08 ENCOUNTER — Other Ambulatory Visit: Payer: Self-pay

## 2021-08-08 ENCOUNTER — Encounter (HOSPITAL_BASED_OUTPATIENT_CLINIC_OR_DEPARTMENT_OTHER): Payer: Medicare Other | Attending: Internal Medicine | Admitting: Internal Medicine

## 2021-08-08 DIAGNOSIS — Z9981 Dependence on supplemental oxygen: Secondary | ICD-10-CM | POA: Diagnosis not present

## 2021-08-08 DIAGNOSIS — Z794 Long term (current) use of insulin: Secondary | ICD-10-CM | POA: Diagnosis not present

## 2021-08-08 DIAGNOSIS — E11621 Type 2 diabetes mellitus with foot ulcer: Secondary | ICD-10-CM | POA: Diagnosis not present

## 2021-08-08 DIAGNOSIS — T8131XD Disruption of external operation (surgical) wound, not elsewhere classified, subsequent encounter: Secondary | ICD-10-CM | POA: Diagnosis not present

## 2021-08-08 DIAGNOSIS — X58XXXD Exposure to other specified factors, subsequent encounter: Secondary | ICD-10-CM | POA: Insufficient documentation

## 2021-08-08 DIAGNOSIS — F17210 Nicotine dependence, cigarettes, uncomplicated: Secondary | ICD-10-CM | POA: Insufficient documentation

## 2021-08-08 DIAGNOSIS — Z89412 Acquired absence of left great toe: Secondary | ICD-10-CM | POA: Diagnosis not present

## 2021-08-08 DIAGNOSIS — L97528 Non-pressure chronic ulcer of other part of left foot with other specified severity: Secondary | ICD-10-CM | POA: Insufficient documentation

## 2021-08-08 DIAGNOSIS — J449 Chronic obstructive pulmonary disease, unspecified: Secondary | ICD-10-CM | POA: Diagnosis not present

## 2021-08-08 NOTE — Progress Notes (Addendum)
[name] ([id number]) , Visit Report for 08/08/2021 Arrival Information Details Patient Name: Date of Service: [name], [name] 08/08/2021 11:00 A M Medical Record Number: [id number] Patient Account Number: [id number] Date of Birth/Sex: Treating RN: [birth year] (66 y.o. [name] Primary Care Adin Lariccia: Carolee Rota Other Clinician: Referring Makayia Duplessis: Treating Robbyn Hodkinson/Extender: Maryagnes Amos in Treatment: 44 Visit Information History Since Last Visit Added or deleted any medications: No Patient Arrived: Kasandra Knudsen Any new allergies or adverse reactions: No Arrival Time: 11:38 Had a fall or experienced change in No Transfer Assistance: None activities of daily living that may affect Patient Identification Verified: Yes risk of falls: Secondary Verification Process Completed: Yes Signs or symptoms of abuse/neglect since last No Patient Requires Transmission-Based Precautions: No visito Patient Has Alerts: Yes Hospitalized since last visit: No Patient Alerts: ABI's: R: 0.82 L:0.76 Implantable device outside of the clinic No excluding cellular tissue based products placed in the center since last visit: Has Dressing in Place as Prescribed: Yes Has Footwear/Offloading in Place as Yes Prescribed: Left: Removable Cast Walker/Walking Boot Pain Present Now: No Electronic Signature(s) Signed: 08/08/2021 12:49:36 PM By: Lorrin Jackson Entered By: Lorrin Jackson on 08/08/2021 11:38:38 -------------------------------------------------------------------------------- Encounter Discharge Information Details Patient Name: Date of Service: Kevin Brookes. 08/08/2021 11:00 A M Medical Record Number: [id number] Patient Account Number: [id number] Date of Birth/Sex: Treating RN: 1955/11/13 (66 y.o. [name] Primary Care Gurkaran Rahm: Carolee Rota Other Clinician: Referring Tayshon Winker: Treating Carynn Felling/Extender: Maryagnes Amos  in Treatment: 22 Encounter Discharge Information Items Post Procedure Vitals Discharge Condition: Stable Temperature (F): 97.6 Ambulatory Status: Cane Pulse (bpm): 101 Discharge Destination: Home Respiratory Rate (breaths/min): 18 Transportation: Private Auto Blood Pressure (mmHg): 91/59 Schedule Follow-up Appointment: Yes Clinical Summary of Care: Provided on 08/08/2021 Form Type Recipient Paper Patient Patient Electronic Signature(s) Signed: 08/08/2021 12:49:36 PM By: Lorrin Jackson Entered By: Lorrin Jackson on 08/08/2021 12:29:17 -------------------------------------------------------------------------------- Lower Extremity Assessment Details Patient Name: Date of Service: [name], [name] 08/08/2021 11:00 A M Medical Record Number: [id number] Patient Account Number: [id number] Date of Birth/Sex: Treating RN: 31-Dec-1955 (66 y.o. [name] Primary Care Raffaella Edison: Carolee Rota Other Clinician: Referring Windell Musson: Treating Kyndra Condron/Extender: Maryagnes Amos in Treatment: 22 Edema Assessment Assessed: [name]: Yes] [name]: No] Edema: [Left: N] [Right: o] Calf Left: Right: Point of Measurement: 37 cm From Medial Instep 41 cm Ankle Left: Right: Point of Measurement: 8 cm From Medial Instep 26 cm Vascular Assessment Pulses: Dorsalis Pedis Palpable: [Left:Yes] Electronic Signature(s) Signed: 08/08/2021 12:49:36 PM By: Lorrin Jackson Entered By: Lorrin Jackson on 08/08/2021 11:46:18 -------------------------------------------------------------------------------- Multi Wound Chart Details Patient Name: Date of Service: Kevin Brookes. 08/08/2021 11:00 A M Medical Record Number: [id number] Patient Account Number: [id number] Date of Birth/Sex: Treating RN: February 28, 1956 (66 y.o. Ulyses Amor, Vaughan Basta Primary Care Dalvin Clipper: Carolee Rota Other Clinician: Referring Deforest Maiden: Treating Jimia Gentles/Extender: Maryagnes Amos in  Treatment: 22 Vital Signs Height(in): Capillary Blood Glucose(mg/dl): 260 Weight(lbs): Pulse(bpm): 101 Body Mass Index(BMI): Blood Pressure(mmHg): 91/59 Temperature(F): 97.6 Respiratory Rate(breaths/min): 18 Photos: [7:Left Amputation Site - Transmetatarsal N/A] [N/A:N/A] Wound Location: [7:Surgical Injury] [N/A:N/A] Wounding Event: [7:Dehisced Wound] [N/A:N/A] Primary Etiology: [7:Chronic Obstructive Pulmonary] [N/A:N/A] Comorbid History: [7:Disease (COPD), Sleep Apnea, Coronary Artery Disease, Hypertension, Type II Diabetes 05/20/2021] [N/A:N/A] Date Acquired: [7:9] [N/A:N/A] Weeks of Treatment: [7:Open] [N/A:N/A] Wound Status: [7:0.8x2x1] [N/A:N/A] Measurements L x W x D (cm) [7:1.257] [N/A:N/A] A (cm) : rea [7:1.257] [N/A:N/A] Volume (cm) : [7:81.40%] [N/A:N/A] % Reduction in A [7:rea: 53.50%] [N/A:N/A] % Reduction  in Volume: [7:Full Thickness Without Exposed] [N/A:N/A] Classification: [7:Support Structures Medium] [N/A:N/A] Exudate A mount: [7:Serosanguineous] [N/A:N/A] Exudate Type: [7:red, brown] [N/A:N/A] Exudate Color: [7:Well defined, not attached] [N/A:N/A] Wound Margin: [7:Medium (34-66%)] [N/A:N/A] Granulation A mount: [7:Pink, Pale] [N/A:N/A] Granulation Quality: [7:Medium (34-66%)] [N/A:N/A] Necrotic A mount: [7:Fat Layer (Subcutaneous Tissue): Yes N/A] Exposed Structures: [7:Fascia: No Tendon: No Muscle: No Joint: No Bone: No Medium (34-66%)] [N/A:N/A] Epithelialization: [7:Debridement - Excisional] [N/A:N/A] Debridement: Pre-procedure Verification/Time Out 11:44 [N/A:N/A] Taken: [7:Other] [N/A:N/A] Pain Control: [7:Callus, Subcutaneous] [N/A:N/A] Tissue Debrided: [7:Skin/Subcutaneous Tissue] [N/A:N/A] Level: [7:1.6] [N/A:N/A] Debridement A (sq cm): [7:rea Curette] [N/A:N/A] Instrument: [7:Minimum] [N/A:N/A] Bleeding: [7:Pressure] [N/A:N/A] Hemostasis A chieved: [7:Procedure was tolerated well] [N/A:N/A] Debridement Treatment Response: [7:0.8x2x1]  [N/A:N/A] Post Debridement Measurements L x W x D (cm) [7:1.257] [N/A:N/A] Post Debridement Volume: (cm) [7:Calloused Periwound] [N/A:N/A] Assessment Notes: [7:Debridement] [N/A:N/A] Treatment Notes Wound #7 (Amputation Site - Transmetatarsal) Wound Laterality: Left Cleanser Soap and Water Discharge Instruction: May shower and wash wound with dial antibacterial soap and water prior to dressing change. Wound Cleanser Discharge Instruction: Cleanse the wound with wound cleanser prior to applying a clean dressing using gauze sponges, not tissue or cotton balls. Peri-Wound Care Topical Gentamicin Discharge Instruction: As directed by physician Primary Dressing Hydrofera Blue Classic Foam, 4x4 in Discharge Instruction: Moisten with saline prior to applying to wound bed Secondary Dressing Woven Gauze Sponge, Non-Sterile 4x4 in Discharge Instruction: Apply over primary dressing as directed. ABD Pad, 5x9 Discharge Instruction: Apply over primary dressing as directed. Secured With Elastic Bandage 4 inch (ACE bandage) Discharge Instruction: Secure with ACE bandage as directed. Kerlix Roll Sterile, 4.5x3.1 (in/yd) Discharge Instruction: Secure with Kerlix as directed. Compression Wrap Compression Stockings Add-Ons Electronic Signature(s) Signed: 08/08/2021 1:33:29 PM By: Kalman Shan DO Signed: 08/11/2021 4:39:43 PM By: Baruch Gouty RN, BSN Entered By: Kalman Shan on 08/08/2021 13:29:21 -------------------------------------------------------------------------------- Multi-Disciplinary Care Plan Details Patient Name: Date of Service: Kevin Brookes. 08/08/2021 11:00 A M Medical Record Number: [id number] Patient Account Number: [id number] Date of Birth/Sex: Treating RN: Apr 05, 1956 (66 y.o. [name] Primary Care Rutledge Selsor: Carolee Rota Other Clinician: Referring Yarelli Decelles: Treating Dunya Meiners/Extender: Maryagnes Amos in Treatment:  80 Multidisciplinary Care Plan reviewed with physician Active Inactive Nutrition Nursing Diagnoses: Impaired glucose control: actual or potential Goals: Patient/caregiver verbalizes understanding of need to maintain therapeutic glucose control per primary care physician Date Initiated: 06/30/2021 Target Resolution Date: 09/12/2021 Goal Status: Active Patient/caregiver will maintain therapeutic glucose control Date Initiated: 06/30/2021 Target Resolution Date: 09/12/2021 Goal Status: Active Interventions: Assess HgA1c results as ordered upon admission and as needed Provide education on elevated blood sugars and impact on wound healing Provide education on nutrition Treatment Activities: Education provided on Nutrition : 06/30/2021 Obtain HgA1c : 06/30/2021 Patient referred to Primary Care Physician for further nutritional evaluation : 06/30/2021 Notes: Wound/Skin Impairment Nursing Diagnoses: Impaired tissue integrity Knowledge deficit related to ulceration/compromised skin integrity Goals: Patient will have a decrease in wound volume by X% from date: (specify in notes) Date Initiated: 03/06/2021 Date Inactivated: 04/15/2021 Target Resolution Date: 03/26/2021 Goal Status: Met Patient/caregiver will verbalize understanding of skin care regimen Date Initiated: 03/06/2021 Target Resolution Date: 09/12/2021 Goal Status: Active Ulcer/skin breakdown will have a volume reduction of 30% by week 4 Date Initiated: 03/06/2021 Date Inactivated: 04/15/2021 Target Resolution Date: 03/26/2021 Goal Status: Unmet Unmet Reason: probable osteomyelitis Ulcer/skin breakdown will have a volume reduction of 50% by week 8 Date Initiated: 04/15/2021 Date Inactivated: 04/21/2021 Target Resolution Date: 04/22/2021 Goal Status: Met Ulcer/skin breakdown will have  a volume reduction of 80% by week 12 Date Initiated: 04/21/2021 Target Resolution Date: 06/30/2021 Goal Status: Active Interventions: Assess  patient/caregiver ability to obtain necessary supplies Assess patient/caregiver ability to perform ulcer/skin care regimen upon admission and as needed Assess ulceration(s) every visit Notes: 06/02/21: Patient had transmet 10/27. Incision dehisced, wound care ongoing. Electronic Signature(s) Signed: 08/08/2021 12:49:36 PM By: Lorrin Jackson Entered By: Lorrin Jackson on 08/08/2021 11:33:34 -------------------------------------------------------------------------------- Pain Assessment Details Patient Name: Date of Service: [name], [name] 08/08/2021 11:00 A M Medical Record Number: [id number] Patient Account Number: [id number] Date of Birth/Sex: Treating RN: 1956/02/04 (66 y.o. [name] Primary Care Natasha Paulson: Carolee Rota Other Clinician: Referring Raef Sprigg: Treating Mireille Lacombe/Extender: Maryagnes Amos in Treatment: 22 Active Problems Location of Pain Severity and Description of Pain Patient Has Paino No Site Locations Pain Management and Medication Current Pain Management: Electronic Signature(s) Signed: 08/08/2021 12:49:36 PM By: Lorrin Jackson Entered By: Lorrin Jackson on 08/08/2021 11:39:15 -------------------------------------------------------------------------------- Patient/Caregiver Education Details Patient Name: Date of Service: [name], Kevin W. 1/20/2023andnbsp11:00 [place] Record Number: [id number] Patient Account Number: [id number] Date of Birth/Gender: Treating RN: 04-24-1956 (66 y.o. [name] Primary Care Physician: Carolee Rota Other Clinician: Referring Physician: Treating Physician/Extender: Maryagnes Amos in Treatment: 22 Education Assessment Education Provided To: Patient Education Topics Provided Elevated Blood Sugar/ Impact on Healing: Methods: Explain/Verbal, Printed Responses: State content correctly Offloading: Methods: Explain/Verbal, Printed Responses: State content  correctly Wound/Skin Impairment: Methods: Explain/Verbal, Printed Responses: State content correctly Electronic Signature(s) Signed: 08/08/2021 12:49:36 PM By: Lorrin Jackson Entered By: Lorrin Jackson on 08/08/2021 11:38:07 -------------------------------------------------------------------------------- Wound Assessment Details Patient Name: Date of Service: Kevin Brookes. 08/08/2021 11:00 A M Medical Record Number: [id number] Patient Account Number: [id number] Date of Birth/Sex: Treating RN: 1956/06/04 (66 y.o. [name] Primary Care Natanya Holecek: Carolee Rota Other Clinician: Referring Avaleigh Decuir: Treating Pamula Luther/Extender: Maryagnes Amos in Treatment: 22 Wound Status Wound Number: 7 Primary Dehisced Wound Etiology: Wound Location: Left Amputation Site - Transmetatarsal Wound Open Wounding Event: Surgical Injury Status: Date Acquired: 05/20/2021 Comorbid Chronic Obstructive Pulmonary Disease (COPD), Sleep Apnea, Weeks Of Treatment: 9 History: Coronary Artery Disease, Hypertension, Type II Diabetes Clustered Wound: No Photos Wound Measurements Length: (cm) 0.8 Width: (cm) 2 Depth: (cm) 1 Area: (cm) 1.257 Volume: (cm) 1.257 % Reduction in Area: 81.4% % Reduction in Volume: 53.5% Epithelialization: Medium (34-66%) Tunneling: No Undermining: No Wound Description Classification: Full Thickness Without Exposed Support Structures Wound Margin: Well defined, not attached Exudate Amount: Medium Exudate Type: Serosanguineous Exudate Color: red, brown Foul Odor After Cleansing: No Slough/Fibrino Yes Wound Bed Granulation Amount: Medium (34-66%) Exposed Structure Granulation Quality: Pink, Pale Fascia Exposed: No Necrotic Amount: Medium (34-66%) Fat Layer (Subcutaneous Tissue) Exposed: Yes Tendon Exposed: No Muscle Exposed: No Joint Exposed: No Bone Exposed: No Assessment Notes Calloused Periwound Treatment Notes Wound #7  (Amputation Site - Transmetatarsal) Wound Laterality: Left Cleanser Soap and Water Discharge Instruction: May shower and wash wound with dial antibacterial soap and water prior to dressing change. Wound Cleanser Discharge Instruction: Cleanse the wound with wound cleanser prior to applying a clean dressing using gauze sponges, not tissue or cotton balls. Peri-Wound Care Topical Gentamicin Discharge Instruction: As directed by physician Primary Dressing Hydrofera Blue Classic Foam, 4x4 in Discharge Instruction: Moisten with saline prior to applying to wound bed Secondary Dressing Woven Gauze Sponge, Non-Sterile 4x4 in Discharge Instruction: Apply over primary dressing as directed. ABD Pad, 5x9 Discharge Instruction: Apply over primary dressing as directed. Secured With [employer]  Bandage 4 inch (ACE bandage) Discharge Instruction: Secure with ACE bandage as directed. Kerlix Roll Sterile, 4.5x3.1 (in/yd) Discharge Instruction: Secure with Kerlix as directed. Compression Wrap Compression Stockings Add-Ons Electronic Signature(s) Signed: 08/08/2021 12:49:36 PM By: Lorrin Jackson Entered By: Lorrin Jackson on 08/08/2021 12:16:24 -------------------------------------------------------------------------------- Vitals Details Patient Name: Date of Service: Kevin Brookes. 08/08/2021 11:00 A M Medical Record Number: [id number] Patient Account Number: [id number] Date of Birth/Sex: Treating RN: [birth year] (66 y.o. [name] Primary Care Elexia Friedt: Carolee Rota Other Clinician: Referring Alahni Varone: Treating Torina Ey/Extender: Maryagnes Amos in Treatment: 22 Vital Signs Time Taken: 11:38 Temperature (F): 97.6 Pulse (bpm): 101 Respiratory Rate (breaths/min): 18 Blood Pressure (mmHg): 91/59 Capillary Blood Glucose (mg/dl): 260 Reference Range: 80 - 120 mg / dl Electronic Signature(s) Signed: 08/08/2021 12:49:36 PM By: Lorrin Jackson Entered By:  Lorrin Jackson on 08/08/2021 11:39:04

## 2021-08-11 DIAGNOSIS — S91109A Unspecified open wound of unspecified toe(s) without damage to nail, initial encounter: Secondary | ICD-10-CM | POA: Diagnosis not present

## 2021-08-11 NOTE — Progress Notes (Signed)
[name] ([id number]) , Visit Report for 08/08/2021 Chief Complaint Document Details Patient Name: Date of Service: [name], [name] 08/08/2021 11:00 A M Medical Record Number: [id number] Patient Account Number: [id number] Date of Birth/Sex: Treating RN: [birth year] (66 y.o. [name] Primary Care Provider: Carolee Rota Other Clinician: Referring Provider: Treating Provider/Extender: Maryagnes Amos in Treatment: 22 Information Obtained from: Patient Chief Complaint Nonhealing wound secondary to Wound dehiscence status post transmetatarsal amputation on the left Electronic Signature(s) Signed: 08/08/2021 1:33:29 PM By: Kalman Shan DO Entered By: Kalman Shan on 08/08/2021 13:29:32 -------------------------------------------------------------------------------- Debridement Details Patient Name: Date of Service: [name]. 08/08/2021 11:00 A M Medical Record Number: [id number] Patient Account Number: [id number] Date of Birth/Sex: Treating RN: [birth year] (66 y.o. [name] Primary Care Provider: Carolee Rota Other Clinician: Referring Provider: Treating Provider/Extender: Maryagnes Amos in Treatment: 22 Debridement Performed for Assessment: Wound #7 Left Amputation Site - Transmetatarsal Performed By: Physician Kalman Shan, DO Debridement Type: Debridement Level of Consciousness (Pre-procedure): Awake and Alert Pre-procedure Verification/Time Out Yes - 11:44 Taken: Start Time: 11:45 Pain Control: Other : Benzocaine T Area Debrided (L x W): otal 0.8 (cm) x 2 (cm) = 1.6 (cm) Tissue and other material debrided: Non-Viable, Callus, Subcutaneous Level: Skin/Subcutaneous Tissue Debridement Description: Excisional Instrument: Curette Bleeding: Minimum Hemostasis Achieved: Pressure End Time: 11:55 Response to Treatment: Procedure was tolerated well Level of Consciousness (Post- Awake and  Alert procedure): Post Debridement Measurements of Total Wound Length: (cm) 0.8 Width: (cm) 2 Depth: (cm) 1 Volume: (cm) 1.257 Character of Wound/Ulcer Post Debridement: Stable Post Procedure Diagnosis Same as Pre-procedure Electronic Signature(s) Signed: 08/08/2021 12:49:36 PM By: Lorrin Jackson Signed: 08/08/2021 1:33:29 PM By: Kalman Shan DO Entered By: Lorrin Jackson on 08/08/2021 12:17:03 -------------------------------------------------------------------------------- HPI Details Patient Name: Date of Service: [name]. 08/08/2021 11:00 A M Medical Record Number: [id number] Patient Account Number: [id number] Date of Birth/Sex: Treating RN: [birth year] (66 y.o. [name] Primary Care Provider: Carolee Rota Other Clinician: Referring Provider: Treating Provider/Extender: Maryagnes Amos in Treatment: 22 History of Present Illness HPI Description: Admission 8/18 [name] is a 66 year old male with a past medical history of uncontrolled insulin-dependent type 2 diabetes, COPD On O2 6 L via nasal cannula That presents to the clinic for a 54-month history of nonhealing wound to the left great toe amputation site. He has been following with podiatry for a wound to his left great toe that developed osteomyelitis and subsequently required amputation. He had the surgery done on 6/10. He had a partial first ray amputation by Dr. Posey Pronto. Since his surgery he has been placing iodine on the wound bed. He states that there has been no improvement to the wound healing in the past 2 months. He states he has occasional intermittent pain to the area. He denies systemic signs of infection. 8/25; patient presents for 1 week follow-up. He states he has been using silver alginate daily without issues. He has no complaints today. He denies signs of infection. He is scheduled to have his MRI on 8/27. 9/15; patient presents for follow-up. He missed his  last clinic appointment due to feeling ill. He continues to use silver alginate daily without any issues. He reports improvement in wound healing. He currently denies signs of infection. 9/27; patient presents for follow-up. He has developed a new wound to the second left toe. He is unaware of this and was noted on exam today. He continues to use silver alginate  to the amputation site wound. He has no issues or complaints today. He currently denies signs of infection. 10/4; patient presents for follow-up. He is using silver alginate to the wound sites. He has no issues or complaints today. He denies systemic signs of infection. 10/10; patient presents for follow-up. He has been using silver alginate to the wound sites. He states that his foot became more swollen over the weekend, But has since improved. He overall feels okay. Admission 06/02/2021 Patient presents for nonhealing wound secondary to wound dehiscence following a transmetatarsal amputation. He states he has been using Betadine wet-to- dry dressings to the wound bed. He states the wound has been open for the past 3 weeks. He currently denies Pain or signs of infection. 11/21; patient presents for follow-up. He has home health but states this will end in 2 weeks per insurance. He has no issues or complaints today. He denies signs of infection. 11/28; patient presents for follow-up. He states that home health will be stopped at the end of the week. He has no issues or complaints today. 12/12; patient presents for follow-up. He states he saw Dr. Posey Pronto on 11/30 and was prescribed doxycycline for cellulitis. He currently reports more erythema and pain over the past week. He states his sugars have been very high and in the 400s. His family encouraged him to go to the ED over the weekend however he declined. 1/20; patient presents for follow-up. He reports having an arteriogram with stenting placed to the left lower extremity. He has been using  Hydrofera Blue to the wound bed with improvement in wound healing. He denies signs of infection. Electronic Signature(s) Signed: 08/08/2021 1:33:29 PM By: Kalman Shan DO Entered By: Kalman Shan on 08/08/2021 13:30:12 -------------------------------------------------------------------------------- Physical Exam Details Patient Name: Date of Service: KAEGAN, HETTICH 08/08/2021 11:00 A M Medical Record Number: [id number] Patient Account Number: [id number] Date of Birth/Sex: Treating RN: 1955/09/17 (66 y.o. [name] Primary Care Provider: Other Clinician: Carolee Rota Referring Provider: Treating Provider/Extender: Maryagnes Amos in Treatment: 22 Constitutional respirations regular, non-labored and within target range for patient.. Cardiovascular 2+ dorsalis pedis/posterior tibialis pulses. Psychiatric pleasant and cooperative. Notes Left foot: Transmetatarsal amputation. open wound to the surgical incision site. Nonviable tissue. Post debridement there is granulation tissue and some scant non viable tissue present. No probing to bone. No drainage noted. Electronic Signature(s) Signed: 08/08/2021 1:33:29 PM By: Kalman Shan DO Entered By: Kalman Shan on 08/08/2021 13:31:46 -------------------------------------------------------------------------------- Physician Orders Details Patient Name: Date of Service: [name]. 08/08/2021 11:00 A M Medical Record Number: [id number] Patient Account Number: [id number] Date of Birth/Sex: Treating RN: Nov 26, 1955 (66 y.o. [name] Primary Care Provider: Carolee Rota Other Clinician: Referring Provider: Treating Provider/Extender: Maryagnes Amos in Treatment: 46 Verbal / Phone Orders: No Diagnosis Coding ICD-10 Coding Code Description [phone] Non-pressure chronic ulcer of other part of left foot with other specified severity T81.31XD Disruption of  external operation (surgical) wound, not elsewhere classified, subsequent encounter E11.621 Type 2 diabetes mellitus with foot ulcer J44.9 Chronic obstructive pulmonary disease, unspecified Follow-up Appointments ppointment in 2 weeks. - with Dr. Heber [place] Return A -Monitor/Control Blood Sugar -Stop/Decrease Smoking Bathing/ Shower/ Hygiene May shower with protection but do not get wound dressing(s) wet. Edema Control - Lymphedema / SCD / Other Elevate legs to the level of the heart or above for 30 minutes daily and/or when sitting, a frequency of: - throughout the day Avoid standing for long periods of time. Moisturize  legs daily. - both legs and feet every night before bed. Off-Loading DH Walker Boot to: - left foot per surgeon Additional Orders / Instructions Follow Nutritious Diet - Monitor and keep blood sugars well controlled Home Health dmit to [place] for wound care. May utilize formulary equivalent dressing for wound treatment orders unless otherwise specified. - A Skilled nursing for wound care 3x a week Other Home Health Orders/Instructions: - [place] Wound Treatment Wound #7 - Amputation Site - Transmetatarsal Wound Laterality: Left Cleanser: Soap and Water Every Other Day/30 Days Discharge Instructions: May shower and wash wound with dial antibacterial soap and water prior to dressing change. Cleanser: Wound Cleanser Every Other Day/30 Days Discharge Instructions: Cleanse the wound with wound cleanser prior to applying a clean dressing using gauze sponges, not tissue or cotton balls. Topical: Gentamicin Every Other Day/30 Days Discharge Instructions: As directed by physician Prim Dressing: Hydrofera Blue Classic Foam, 4x4 in (DME) (Generic) Every Other Day/30 Days ary Discharge Instructions: Moisten with saline prior to applying to wound bed Secondary Dressing: Woven Gauze Sponge, Non-Sterile 4x4 in (DME) (Generic) Every Other Day/30 Days Discharge  Instructions: Apply over primary dressing as directed. Secondary Dressing: ABD Pad, 5x9 (DME) (Generic) Every Other Day/30 Days Discharge Instructions: Apply over primary dressing as directed. Secured With: Elastic Bandage 4 inch (ACE bandage) (DME) (Generic) Every Other Day/30 Days Discharge Instructions: Secure with ACE bandage as directed. Secured With: [employer], 4.5x3.1 (in/yd) (DME) (Generic) Every Other Day/30 Days Discharge Instructions: Secure with Kerlix as directed. Electronic Signature(s) Signed: 08/08/2021 1:33:29 PM By: Kalman Shan DO Previous Signature: 08/08/2021 12:49:36 PM Version By: Lorrin Jackson Entered By: Kalman Shan on 08/08/2021 13:32:02 -------------------------------------------------------------------------------- Problem List Details Patient Name: Date of Service: [name]. 08/08/2021 11:00 A M Medical Record Number: [id number] Patient Account Number: [id number] Date of Birth/Sex: Treating RN: 1956-03-27 (66 y.o. [name] Primary Care Provider: Carolee Rota Other Clinician: Referring Provider: Treating Provider/Extender: Maryagnes Amos in Treatment: 22 Active Problems ICD-10 Encounter Code Description Active Date MDM Diagnosis L97.528 Non-pressure chronic ulcer of other part of left foot with other specified 06/02/2021 No Yes severity T81.31XD Disruption of external operation (surgical) wound, not elsewhere classified, 06/02/2021 No Yes subsequent encounter E11.621 Type 2 diabetes mellitus with foot ulcer 03/06/2021 No Yes J44.9 Chronic obstructive pulmonary disease, unspecified 03/06/2021 No Yes Inactive Problems ICD-10 Code Description Active Date Inactive Date S91.302A Unspecified open wound, left foot, initial encounter 03/06/2021 03/06/2021 Resolved Problems Electronic Signature(s) Signed: 08/08/2021 1:33:29 PM By: Kalman Shan DO Previous Signature: 08/08/2021 12:49:36 PM Version By:  Lorrin Jackson Entered By: Kalman Shan on 08/08/2021 13:28:37 -------------------------------------------------------------------------------- Progress Note Details Patient Name: Date of Service: [name]. 08/08/2021 11:00 A M Medical Record Number: [id number] Patient Account Number: [id number] Date of Birth/Sex: Treating RN: 1956-03-16 (66 y.o. [name] Primary Care Provider: Carolee Rota Other Clinician: Referring Provider: Treating Provider/Extender: Maryagnes Amos in Treatment: 22 Subjective Chief Complaint Information obtained from Patient Nonhealing wound secondary to Wound dehiscence status post transmetatarsal amputation on the left History of Present Illness (HPI) Admission 8/18 [name] is a 66 year old male with a past medical history of uncontrolled insulin-dependent type 2 diabetes, COPD On O2 6 L via nasal cannula That presents to the clinic for a 66-month history of nonhealing wound to the left great toe amputation site. He has been following with podiatry for a wound to his left great toe that developed osteomyelitis and subsequently required amputation.  He had the surgery done on 6/10. He had a partial first ray amputation by Dr. Posey Pronto. Since his surgery he has been placing iodine on the wound bed. He states that there has been no improvement to the wound healing in the past 2 months. He states he has occasional intermittent pain to the area. He denies systemic signs of infection. 8/25; patient presents for 1 week follow-up. He states he has been using silver alginate daily without issues. He has no complaints today. He denies signs of infection. He is scheduled to have his MRI on 8/27. 9/15; patient presents for follow-up. He missed his last clinic appointment due to feeling ill. He continues to use silver alginate daily without any issues. He reports improvement in wound healing. He currently denies signs of  infection. 9/27; patient presents for follow-up. He has developed a new wound to the second left toe. He is unaware of this and was noted on exam today. He continues to use silver alginate to the amputation site wound. He has no issues or complaints today. He currently denies signs of infection. 10/4; patient presents for follow-up. He is using silver alginate to the wound sites. He has no issues or complaints today. He denies systemic signs of infection. 10/10; patient presents for follow-up. He has been using silver alginate to the wound sites. He states that his foot became more swollen over the weekend, But has since improved. He overall feels okay. Admission 06/02/2021 Patient presents for nonhealing wound secondary to wound dehiscence following a transmetatarsal amputation. He states he has been using Betadine wet-to- dry dressings to the wound bed. He states the wound has been open for the past 3 weeks. He currently denies Pain or signs of infection. 11/21; patient presents for follow-up. He has home health but states this will end in 2 weeks per insurance. He has no issues or complaints today. He denies signs of infection. 11/28; patient presents for follow-up. He states that home health will be stopped at the end of the week. He has no issues or complaints today. 12/12; patient presents for follow-up. He states he saw Dr. Posey Pronto on 11/30 and was prescribed doxycycline for cellulitis. He currently reports more erythema and pain over the past week. He states his sugars have been very high and in the 400s. His family encouraged him to go to the ED over the weekend however he declined. 1/20; patient presents for follow-up. He reports having an arteriogram with stenting placed to the left lower extremity. He has been using Hydrofera Blue to the wound bed with improvement in wound healing. He denies signs of infection. Patient History Information obtained from Patient. Family History Cancer -  Maternal Grandparents,Paternal Grandparents,Father,Mother, Diabetes - Mother,Father, Heart Disease - Maternal Grandparents,Mother,Father, Kidney Disease - Maternal Grandparents, Lung Disease - Father, Stroke - Paternal Grandparents,Maternal Grandparents,Mother, No family history of Hereditary Spherocytosis, Hypertension, Seizures, Thyroid Problems, Tuberculosis. Social History Current every day smoker - 1/2 pack a day, Alcohol Use - Never, Drug Use - No History, Caffeine Use - Daily - coffee, tea. Medical History Respiratory Patient has history of Chronic Obstructive Pulmonary Disease (COPD), Sleep Apnea - does not use CPAP Cardiovascular Patient has history of Coronary Artery Disease - mx vessel, Hypertension Gastrointestinal Denies history of Cirrhosis , Colitis, Crohnoos, Hepatitis A, Hepatitis B, Hepatitis C Endocrine Patient has history of Type II Diabetes - last A1c - 13.3 Hospitalization/Surgery History - appendectomy. - cholecystectomy. - laproscopic nephrectomy. - lapro lysis of lesions. - thorascopy. - lung  biopsy. - bronchoscopy. - (L) heart cath with angio X3. - endarcdectomy. - patch angioplasty. - left great toe amp. December 27, 2020. Medical A Surgical History Notes nd Constitutional Symptoms (General Health) noncompliance , cocaine abuse ,hepatitis ,headaches Respiratory h/o pneumonia , end stage chronic lung disease , COPD/ILD Cardiovascular h/o CVA , hyperlipidemia , coronary atherosclerosis , acute on chronic diastolic heart failure LVEF 60%-65% Gastrointestinal cholecystitis , GERD Genitourinary h/o nephrolithiasis , h/o renal cell carcinoma , CKD Stage 3 Musculoskeletal arthritis Psychiatric anxiety , depression Objective Constitutional respirations regular, non-labored and within target range for patient.. Vitals Time Taken: 11:38 AM, Temperature: 97.6 F, Pulse: 101 bpm, Respiratory Rate: 18 breaths/min, Blood Pressure: 91/59 mmHg, Capillary Blood  Glucose: 260 mg/dl. Cardiovascular 2+ dorsalis pedis/posterior tibialis pulses. Psychiatric pleasant and cooperative. General Notes: Left foot: Transmetatarsal amputation. open wound to the surgical incision site. Nonviable tissue. Post debridement there is granulation tissue and some scant non viable tissue present. No probing to bone. No drainage noted. Integumentary (Hair, Skin) Wound #7 status is Open. Original cause of wound was Surgical Injury. The date acquired was: 05/20/2021. The wound has been in treatment 9 weeks. The wound is located on the Left Amputation Site - Transmetatarsal. The wound measures 0.8cm length x 2cm width x 1cm depth; 1.257cm^2 area and 1.257cm^3 volume. There is Fat Layer (Subcutaneous Tissue) exposed. There is no tunneling or undermining noted. There is a medium amount of serosanguineous drainage noted. The wound margin is well defined and not attached to the wound base. There is medium (34-66%) pink, pale granulation within the wound bed. There is a medium (34-66%) amount of necrotic tissue within the wound bed. General Notes: Calloused Periwound Assessment Active Problems ICD-10 Non-pressure chronic ulcer of other part of left foot with other specified severity Disruption of external operation (surgical) wound, not elsewhere classified, subsequent encounter Type 2 diabetes mellitus with foot ulcer Chronic obstructive pulmonary disease, unspecified Patient's wound has shown improvement in size in appearance since last clinic visit. He had an arteriogram and had stenting placed in the left superficial femoral artery. I debrided nonviable tissue. I recommended continue Hydrofera Blue. Follow-up in 2 weeks. Procedures Wound #7 Pre-procedure diagnosis of Wound #7 is a Dehisced Wound located on the Left Amputation Site - Transmetatarsal . There was a Excisional Skin/Subcutaneous Tissue Debridement with a total area of 1.6 sq cm performed by Kalman Shan, DO.  With the following instrument(s): Curette to remove Non-Viable tissue/material. Material removed includes Callus and Subcutaneous Tissue and after achieving pain control using Other (Benzocaine). No specimens were taken. A time out was conducted at 11:44, prior to the start of the procedure. A Minimum amount of bleeding was controlled with Pressure. The procedure was tolerated well. Post Debridement Measurements: 0.8cm length x 2cm width x 1cm depth; 1.257cm^3 volume. Character of Wound/Ulcer Post Debridement is stable. Post procedure Diagnosis Wound #7: Same as Pre-Procedure Plan Follow-up Appointments: Return Appointment in 2 weeks. - with Dr. Heber [place] -Monitor/Control Blood Sugar -Stop/Decrease Smoking Bathing/ Shower/ Hygiene: May shower with protection but do not get wound dressing(s) wet. Edema Control - Lymphedema / SCD / Other: Elevate legs to the level of the heart or above for 30 minutes daily and/or when sitting, a frequency of: - throughout the day Avoid standing for long periods of time. Moisturize legs daily. - both legs and feet every night before bed. Off-Loading: DH Walker Boot to: - left foot per surgeon Additional Orders / Instructions: Follow Nutritious Diet - Monitor and keep blood sugars  well controlled Home Health: Admit to [place] for wound care. May utilize formulary equivalent dressing for wound treatment orders unless otherwise specified. - Skilled nursing for wound care 3x a week Other Home Health Orders/Instructions: - [place] #7: - Amputation Site - Transmetatarsal Wound Laterality: Left Cleanser: Soap and Water Every Other Day/30 Days Discharge Instructions: May shower and wash wound with dial antibacterial soap and water prior to dressing change. Cleanser: Wound Cleanser Every Other Day/30 Days Discharge Instructions: Cleanse the wound with wound cleanser prior to applying a clean dressing using gauze sponges, not tissue or cotton  balls. Topical: Gentamicin Every Other Day/30 Days Discharge Instructions: As directed by physician Prim Dressing: Hydrofera Blue Classic Foam, 4x4 in (DME) (Generic) Every Other Day/30 Days ary Discharge Instructions: Moisten with saline prior to applying to wound bed Secondary Dressing: Woven Gauze Sponge, Non-Sterile 4x4 in (DME) (Generic) Every Other Day/30 Days Discharge Instructions: Apply over primary dressing as directed. Secondary Dressing: ABD Pad, 5x9 (DME) (Generic) Every Other Day/30 Days Discharge Instructions: Apply over primary dressing as directed. Secured With: Elastic Bandage 4 inch (ACE bandage) (DME) (Generic) Every Other Day/30 Days Discharge Instructions: Secure with ACE bandage as directed. Secured With: [employer], 4.5x3.1 (in/yd) (DME) (Generic) Every Other Day/30 Days Discharge Instructions: Secure with Kerlix as directed. 1. Hydrofera Blue 2. In office sharp debridement 3. Follow-up in 2 weeks Electronic Signature(s) Signed: 08/08/2021 1:33:29 PM By: Kalman Shan DO Entered By: Kalman Shan on 08/08/2021 13:32:50 -------------------------------------------------------------------------------- HxROS Details Patient Name: Date of Service: [name]. 08/08/2021 11:00 A M Medical Record Number: [id number] Patient Account Number: [id number] Date of Birth/Sex: Treating RN: 07-10-1956 (66 y.o. [name] Primary Care Provider: Carolee Rota Other Clinician: Referring Provider: Treating Provider/Extender: Maryagnes Amos in Treatment: 26 Information Obtained From Patient Constitutional Symptoms (General Health) Medical History: Past Medical History Notes: noncompliance , cocaine abuse ,hepatitis ,headaches Respiratory Medical History: Positive for: Chronic Obstructive Pulmonary Disease (COPD); Sleep Apnea - does not use CPAP Past Medical History Notes: h/o pneumonia , end stage chronic lung disease ,  COPD/ILD Cardiovascular Medical History: Positive for: Coronary Artery Disease - mx vessel; Hypertension Past Medical History Notes: h/o CVA , hyperlipidemia , coronary atherosclerosis , acute on chronic diastolic heart failure LVEF 60%-65% Gastrointestinal Medical History: Negative for: Cirrhosis ; Colitis; Crohns; Hepatitis A; Hepatitis B; Hepatitis C Past Medical History Notes: cholecystitis , GERD Endocrine Medical History: Positive for: Type II Diabetes - last A1c - 13.3 Genitourinary Medical History: Past Medical History Notes: h/o nephrolithiasis , h/o renal cell carcinoma , CKD Stage 3 Musculoskeletal Medical History: Past Medical History Notes: arthritis Psychiatric Medical History: Past Medical History Notes: anxiety , depression Immunizations Pneumococcal Vaccine: Received Pneumococcal Vaccination: Yes Received Pneumococcal Vaccination On or After 60th Birthday: Yes Implantable Devices None Hospitalization / Surgery History Type of Hospitalization/Surgery appendectomy cholecystectomy laproscopic nephrectomy lapro lysis of lesions thorascopy lung biopsy bronchoscopy (L) heart cath with angio X3 endarcdectomy patch angioplasty left great toe amp. December 27, 2020 Family and Social History Cancer: Yes - Maternal Grandparents,Paternal Grandparents,Father,Mother; Diabetes: Yes - Mother,Father; Heart Disease: Yes - Maternal Grandparents,Mother,Father; Hereditary Spherocytosis: No; Hypertension: No; Kidney Disease: Yes - Maternal Grandparents; Lung Disease: Yes - Father; Seizures: No; Stroke: Yes - Paternal Grandparents,Maternal Grandparents,Mother; Thyroid Problems: No; Tuberculosis: No; Current every day smoker - 1/2 pack a day; Alcohol Use: Never; Drug Use: No History; Caffeine Use: Daily - coffee, tea; Financial Concerns: No; Food, Clothing or Shelter Needs: No; Support System  Lacking: No; Transportation Concerns: No Electronic Signature(s) Signed: 08/08/2021  1:33:29 PM By: Kalman Shan DO Signed: 08/11/2021 4:39:43 PM By: Kevin Gouty RN, BSN Entered By: Kalman Shan on 08/08/2021 13:30:23 -------------------------------------------------------------------------------- [place] Details Patient Name: Date of Service: [name] 08/08/2021 Medical Record Number: [id number] Patient Account Number: [id number] Date of Birth/Sex: Treating RN: 09-Feb-1956 (66 y.o. [name] Primary Care Provider: Carolee Rota Other Clinician: Referring Provider: Treating Provider/Extender: Maryagnes Amos in Treatment: 22 Diagnosis Coding ICD-10 Codes Code Description [phone] Non-pressure chronic ulcer of other part of left foot with other specified severity T81.31XD Disruption of external operation (surgical) wound, not elsewhere classified, subsequent encounter E11.621 Type 2 diabetes mellitus with foot ulcer J44.9 Chronic obstructive pulmonary disease, unspecified Facility Procedures CPT4 Code: [id number] Description: 03013 - DEB SUBQ TISSUE 20 SQ CM/< ICD-10 Diagnosis Description L97.528 Non-pressure chronic ulcer of other part of left foot with other specified seve Modifier: rity Quantity: 1 Physician Procedures : CPT4 Code Description Modifier [id number] - WC PHYS SUBQ TISS 20 SQ CM ICD-10 Diagnosis Description L97.528 Non-pressure chronic ulcer of other part of left foot with other specified severity Quantity: 1 Electronic Signature(s) Signed: 08/08/2021 1:33:29 PM By: Kalman Shan DO Previous Signature: 08/08/2021 12:49:36 PM Version By: Lorrin Jackson Entered By: Kalman Shan on 08/08/2021 13:33:08

## 2021-08-19 DIAGNOSIS — S91109A Unspecified open wound of unspecified toe(s) without damage to nail, initial encounter: Secondary | ICD-10-CM | POA: Diagnosis not present

## 2021-08-20 ENCOUNTER — Ambulatory Visit (INDEPENDENT_AMBULATORY_CARE_PROVIDER_SITE_OTHER): Payer: Medicare Other | Admitting: Podiatry

## 2021-08-20 ENCOUNTER — Ambulatory Visit: Payer: Commercial Managed Care - HMO | Admitting: Podiatry

## 2021-08-20 ENCOUNTER — Other Ambulatory Visit: Payer: Self-pay

## 2021-08-20 DIAGNOSIS — L97522 Non-pressure chronic ulcer of other part of left foot with fat layer exposed: Secondary | ICD-10-CM

## 2021-08-20 DIAGNOSIS — E1142 Type 2 diabetes mellitus with diabetic polyneuropathy: Secondary | ICD-10-CM

## 2021-08-20 NOTE — Progress Notes (Signed)
Subjective:  Patient ID: [name], male    DOB: 11/04/1955,  MRN: [id number]  Chief Complaint  Patient presents with   Wound Check    66 y.o. male presents for wound care. Patient presents with left foot transmetatarsal amputation stump wound.  Patient surgery was on 05/05/2021.  He states he is doing a lot better.  The wound care center has been helping him heal the wound.  He is a diabetic he denies any other acute complaints    07/17/2021 vascular surgery report:Impression:             #1  Left superficial femoral artery occlusion, successfully crossed and stented using 7 mm overlapping Elluvia stents.  This did require pedal access.             #2  Three-vessel runoff to the left foot             #3  A total of 60 cc of contrast were utilized for the procedure.   Review of Systems: Negative except as noted in the HPI. Denies N/V/F/Ch.  Past Medical History:  Diagnosis Date   Anxiety    Arthritis    Cholecystitis, acute 12/20/2013   Lap chole 6/5   Cocaine abuse ([name])    COPD (chronic obstructive pulmonary disease) ([name])    Coronary atherosclerosis of native coronary artery    a. 03/09/2013 Cath/PCI: LM nl, LAD: 50p, 80m (2.5x16 promus DES), LCX nl, OM1 min irregs, LPL/LPDA diff dzs, RCA nondom, mod diff dzs, EF 55%.   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 ([place]) 04/24/2021   History of nephrolithiasis    History of pneumonia    History of stroke    Right MCA distribution, residual left-sided weakness   Hyperlipidemia    Noncompliance    Osteomyelitis of second toe of left foot ([place]) 04/24/2021   Renal cell carcinoma ([name])    Status post radical right nephrectomy August 2015   Sleep apnea    On CPAP, 4L O2 no cpap at home yet   Type 2 diabetes mellitus ([place]) 2011    Current Outpatient Medications:    acetaminophen (TYLENOL)  325 MG tablet, Take 650 mg by mouth as needed (pain)., Disp: , Rfl:    ALPRAZolam (XANAX) 0.5 MG tablet, Take 1 tablet (0.5 mg total) by mouth 3 (three) times daily as needed for anxiety. (Patient not taking: Reported on 06/05/2021), Disp: , Rfl: 0   ALPRAZolam (XANAX) 1 MG tablet, Take 1 mg by mouth 3 (three) times daily as needed., Disp: , Rfl:    atorvastatin (LIPITOR) 20 MG tablet, Take 20 mg by mouth at bedtime., Disp: , Rfl: 1   azithromycin (ZITHROMAX) 250 MG tablet, Take 2 tablets first day then 1 tablet daily until gone (Patient not taking: Reported on 07/15/2021), Disp: 6 tablet, Rfl: 0   Budeson-Glycopyrrol-Formoterol (BREZTRI AEROSPHERE) 160-9-4.8 MCG/ACT AERO, Inhale 2 puffs into the lungs in the morning and at bedtime., Disp: 10.7 g, Rfl: 5   budesonide (PULMICORT) 1 MG/2ML nebulizer solution, Take 1 mg by nebulization as needed (shortness of breath)., Disp: , Rfl:    clopidogrel (PLAVIX) 75 MG tablet, TAKE ONE TABLET BY MOUTH EVERY DAY (Patient taking differently: Take 75 mg by mouth daily.), Disp: 90 tablet, Rfl: 2   diltiazem (CARDIZEM CD) 180 MG 24  hr capsule, TAKE ONE CAPSULE BY MOUTH EVERY DAY (Patient taking differently: Take 180 mg by mouth daily.), Disp: 90 capsule, Rfl: 2   diphenhydrAMINE (BENADRYL) 25 mg capsule, Take 25 mg by mouth every 6 (six) hours as needed for itching (take with percocet)., Disp: , Rfl:    esomeprazole (NEXIUM) 40 MG capsule, Take 40 mg by mouth at bedtime. Pt only take as needed, Disp: , Rfl:    FLUoxetine (PROZAC) 20 MG capsule, Take 20 mg by mouth daily., Disp: , Rfl:    FLUoxetine (PROZAC) 40 MG capsule, Take 40 mg by mouth daily. Take with 10mg  capsule for a total of 50mg  daily., Disp: , Rfl:    formoterol (PERFOROMIST) 20 MCG/2ML nebulizer solution, Take 20 mcg by nebulization as needed (shortness of breath)., Disp: , Rfl:    gabapentin (NEURONTIN) 300 MG capsule, Take 900 mg by mouth 3 (three) times daily., Disp: , Rfl:    glucose blood (TRUE  METRIX BLOOD GLUCOSE TEST) test strip, , Disp: , Rfl:    insulin regular human CONCENTRATED (HUMULIN R) 500 UNIT/ML injection, Inject 90 Units into the skin 3 (three) times daily with meals., Disp: , Rfl:    isosorbide mononitrate (IMDUR) 60 MG 24 hr tablet, TAKE ONE TABLET BY MOUTH EVERY DAY (Patient taking differently: Take 60 mg by mouth daily.), Disp: 90 tablet, Rfl: 1   linagliptin (TRADJENTA) 5 MG TABS tablet, Take 5 mg by mouth daily., Disp: , Rfl:    nitroGLYCERIN (NITROSTAT) 0.4 MG SL tablet, PLACE ONE tablet UNDER THE TONGUE every FIVE minutes as needed FOR CHEST PAIN. IF 3 TABLETS ARE NECESSARY CALL 911 (Patient taking differently: Place 0.4 mg under the tongue every 5 (five) minutes as needed for chest pain.), Disp: 25 tablet, Rfl: 3   potassium chloride SA (K-DUR,KLOR-CON) 20 MEQ tablet, Take 20 mEq by mouth 2 (two) times daily. , Disp: , Rfl:    pregabalin (LYRICA) 25 MG capsule, Take 25 mg by mouth 2 (two) times daily., Disp: , Rfl:    Probiotic Product (PROBIOTIC PO), Take 1 capsule by mouth daily., Disp: , Rfl:    tamsulosin (FLOMAX) 0.4 MG CAPS capsule, Take 0.4 mg by mouth daily. , Disp: , Rfl:    torsemide (DEMADEX) 20 MG tablet, TAKE THREE TABLETS BY MOUTH TWICE DAILY (Patient taking differently: Take 20 mg by mouth 2 (two) times daily.), Disp: 540 tablet, Rfl: 0   zolpidem (AMBIEN) 5 MG tablet, Take 5 mg by mouth at bedtime. For insomnia., Disp: , Rfl:   Social History   Tobacco Use  Smoking Status Every Day   Packs/day: 1.00   Years: 41.00   Pack years: 41.00   Types: Cigarettes   Start date: 05/03/1972  Smokeless Tobacco Never  Tobacco Comments   1/2 ppd 06/05/21    Allergies  Allergen Reactions   Lisinopril Other (See Comments)    Other reaction(s): worsening kidney function   Oxycodone Itching and Nausea Only    Pt is able to tolerate if taken with benadryl   Objective:  There were no vitals filed for this visit. There is no height or weight on file to  calculate BMI. Constitutional Well developed. Well nourished.  Vascular Dorsalis pedis pulses palpable bilaterally. Posterior tibial pulses palpable bilaterally. Capillary refill normal to all digits.  No cyanosis or clubbing noted. Pedal hair growth normal.  Neurologic Normal speech. Oriented to person, place, and time. Protective sensation absent  Dermatologic Wound Location: Left transmetatarsal amputation stump site with fat  layer exposed.  The wound is improved.  There is still an area that probes down to deep tissue but no exposure of bone.  Orthopedic: No pain to palpation either foot.   Radiographs: None Assessment:   1. Foot ulcer, left, with fat layer exposed ([place])   2. Diabetic peripheral neuropathy associated with type 2 diabetes mellitus ([place])     Plan:  Patient was evaluated and treated and all questions answered.  Ulcer left foot ulceration status post transmetatarsal amputation with fat layer exposed -Patient is being primarily managed at the wound care center.  Clinically it is improving considerably.  At this time I will continue to monitor him from afar and follow-up when needed.  Wound has improved considerably.  Patient states understanding well we will see him back in 3 months for evaluation  No follow-ups on file.

## 2021-08-22 ENCOUNTER — Other Ambulatory Visit: Payer: Self-pay

## 2021-08-22 ENCOUNTER — Encounter (HOSPITAL_BASED_OUTPATIENT_CLINIC_OR_DEPARTMENT_OTHER): Payer: Medicare Other | Attending: Internal Medicine | Admitting: Internal Medicine

## 2021-08-22 DIAGNOSIS — L97528 Non-pressure chronic ulcer of other part of left foot with other specified severity: Secondary | ICD-10-CM | POA: Insufficient documentation

## 2021-08-22 DIAGNOSIS — J449 Chronic obstructive pulmonary disease, unspecified: Secondary | ICD-10-CM | POA: Diagnosis not present

## 2021-08-22 DIAGNOSIS — E11621 Type 2 diabetes mellitus with foot ulcer: Secondary | ICD-10-CM | POA: Diagnosis not present

## 2021-08-22 DIAGNOSIS — Z89412 Acquired absence of left great toe: Secondary | ICD-10-CM | POA: Insufficient documentation

## 2021-08-22 DIAGNOSIS — T8131XD Disruption of external operation (surgical) wound, not elsewhere classified, subsequent encounter: Secondary | ICD-10-CM

## 2021-08-22 DIAGNOSIS — Y835 Amputation of limb(s) as the cause of abnormal reaction of the patient, or of later complication, without mention of misadventure at the time of the procedure: Secondary | ICD-10-CM | POA: Insufficient documentation

## 2021-08-22 DIAGNOSIS — T8781 Dehiscence of amputation stump: Secondary | ICD-10-CM | POA: Insufficient documentation

## 2021-08-22 DIAGNOSIS — Z9981 Dependence on supplemental oxygen: Secondary | ICD-10-CM | POA: Diagnosis not present

## 2021-08-25 DIAGNOSIS — J449 Chronic obstructive pulmonary disease, unspecified: Secondary | ICD-10-CM | POA: Diagnosis not present

## 2021-08-25 NOTE — Progress Notes (Signed)
[name] ([id number]) , Visit Report for 08/22/2021 Chief Complaint Document Details Patient Name: Date of Service: Kevin, [name] 08/22/2021 10:30 A M Medical Record Number: [id number] Patient Account Number: [id number] Date of Birth/Sex: Treating RN: [birth year] (66 y.o. [name] Primary Care Provider: Carolee [name] Other Clinician: Referring Provider: Treating Provider/Extender: [name] in Treatment: 24 Information Obtained from: Patient Chief Complaint Nonhealing wound secondary to Wound dehiscence status post transmetatarsal amputation on the left Electronic Signature(s) Signed: 08/22/2021 12:57:16 PM By: Kevin Shan DO Entered By: [name] on 08/22/2021 12:50:41 -------------------------------------------------------------------------------- HPI Details Patient Name: Date of Service: [name]. 08/22/2021 10:30 A M Medical Record Number: [id number] Patient Account Number: [id number] Date of Birth/Sex: Treating RN: [birth year] (67 y.o. [name] Primary Care Provider: Carolee [name] Other Clinician: Referring Provider: Treating Provider/Extender: [name] in Treatment: 24 History of Present Illness HPI Description: Admission 8/18 [name] is a 66 year old male with a past medical history of uncontrolled insulin-dependent type 2 diabetes, COPD On O2 6 L via nasal cannula That presents to the clinic for a 19-month history of nonhealing wound to the left great toe amputation site. He has been following with podiatry for a wound to his left great toe that developed osteomyelitis and subsequently required amputation. He had the surgery done on 6/10. He had a partial first ray amputation by [name]. Since his surgery he has been placing iodine on the wound bed. He states that there has been no improvement to the wound healing in the past 2 months. He states he has occasional  intermittent pain to the area. He denies systemic signs of infection. 8/25; patient presents for 1 week follow-up. He states he has been using silver alginate daily without issues. He has no complaints today. He denies signs of infection. He is scheduled to have his MRI on 8/27. 9/15; patient presents for follow-up. He missed his last clinic appointment due to feeling ill. He continues to use silver alginate daily without any issues. He reports improvement in wound healing. He currently denies signs of infection. 9/27; patient presents for follow-up. He has developed a new wound to the second left toe. He is unaware of this and was noted on exam today. He continues to use silver alginate to the amputation site wound. He has no issues or complaints today. He currently denies signs of infection. 10/4; patient presents for follow-up. He is using silver alginate to the wound sites. He has no issues or complaints today. He denies systemic signs of infection. 10/10; patient presents for follow-up. He has been using silver alginate to the wound sites. He states that his foot became more swollen over the weekend, But has since improved. He overall feels okay. Admission 06/02/2021 Patient presents for nonhealing wound secondary to wound dehiscence following a transmetatarsal amputation. He states he has been using Betadine wet-to- dry dressings to the wound bed. He states the wound has been open for the past 3 weeks. He currently denies Pain or signs of infection. 11/21; patient presents for follow-up. He has home health but states this will end in 2 weeks per insurance. He has no issues or complaints today. He denies signs of infection. 11/28; patient presents for follow-up. He states that home health will be stopped at the end of the week. He has no issues or complaints today. 12/12; patient presents for follow-up. He states he saw [name] on 11/30 and was prescribed doxycycline for cellulitis.  He  currently reports more erythema and pain over the past week. He states his sugars have been very high and in the 400s. His family encouraged him to go to the ED over the weekend however he declined. 1/20; patient presents for follow-up. He reports having an arteriogram with stenting placed to the left lower extremity. He has been using Hydrofera Blue to the wound bed with improvement in wound healing. He denies signs of infection. 2/3; patient presents for follow-up. He has been using Hydrofera Blue to the wound beds without issues. He reports improvement in wound healing. He denies signs of infection. Electronic Signature(s) Signed: 08/22/2021 12:57:16 PM By: Kevin Shan DO Entered By: [name] on 08/22/2021 12:51:02 -------------------------------------------------------------------------------- Physical Exam Details Patient Name: Date of Service: [name] 08/22/2021 10:30 A M Medical Record Number: [id number] Patient Account Number: [id number] Date of Birth/Sex: Treating RN: 1955-12-04 (66 y.o. [name] Primary Care Provider: Carolee [name] Other Clinician: Referring Provider: Treating Provider/Extender: [name] in Treatment: 24 Constitutional respirations regular, non-labored and within target range for patient.. Cardiovascular 2+ dorsalis pedis/posterior tibialis pulses. Psychiatric pleasant and cooperative. Notes Left foot: Transmetatarsal amputation. Open wound with granulation tissue and scant nonviable tissue. No probing to bone. No drainage noted. Improvement in healing since last clinic visit. Electronic Signature(s) Signed: 08/22/2021 12:57:16 PM By: Kevin Shan DO Entered By: [name] on 08/22/2021 12:51:40 -------------------------------------------------------------------------------- Physician Orders Details Patient Name: Date of Service: [name]. 08/22/2021 10:30 A M Medical Record Number:  [id number] Patient Account Number: [id number] Date of Birth/Sex: Treating RN: [birth year] (66 y.o. [name] Primary Care Provider: Carolee [name] Other Clinician: Referring Provider: Treating Provider/Extender: [name] in Treatment: 24 Verbal / Phone Orders: No Diagnosis Coding Follow-up Appointments ppointment in 2 weeks. - with Dr. Heber Marmaduke Return A -Monitor/Control Blood Sugar -Stop/Decrease Smoking Bathing/ Shower/ Hygiene May shower with protection but do not get wound dressing(s) wet. Edema Control - Lymphedema / SCD / Other Elevate legs to the level of the heart or above for 30 minutes daily and/or when sitting, a frequency of: - throughout the day Avoid standing for long periods of time. Moisturize legs daily. - both legs and feet every night before bed. Off-Loading DH Walker Boot to: - left foot per surgeon Additional Orders / Instructions Follow Nutritious Diet - Monitor and keep blood sugars well controlled Home Health No change in wound care orders this week; continue Home Health for wound care. May utilize formulary equivalent dressing for wound treatment orders unless otherwise specified. - Skilled nursing for wound care 3x a week Other Home Health Orders/Instructions: - [facility] Wound Treatment Wound #7 - Amputation Site - Transmetatarsal Wound Laterality: Left Cleanser: Soap and Water Every Other Day/30 Days Discharge Instructions: May shower and wash wound with dial antibacterial soap and water prior to dressing change. Cleanser: Wound Cleanser Every Other Day/30 Days Discharge Instructions: Cleanse the wound with wound cleanser prior to applying a clean dressing using gauze sponges, not tissue or cotton balls. Topical: Gentamicin Every Other Day/30 Days Discharge Instructions: As directed by physician ****In office only*** Prim Dressing: Hydrofera Blue Classic Foam, 4x4 in (Generic) Every Other Day/30  Days ary Discharge Instructions: Moisten with saline prior to applying to wound bed Secondary Dressing: Woven Gauze Sponge, Non-Sterile 4x4 in (Generic) Every Other Day/30 Days Discharge Instructions: Apply over primary dressing as directed. Secondary Dressing: ABD Pad, 5x9 (Generic) Every Other Day/30 Days Discharge Instructions: Apply over primary dressing as  directed. Secured With: Elastic Bandage 4 inch (ACE bandage) (Generic) Every Other Day/30 Days Discharge Instructions: Secure with ACE bandage as directed. Secured With: [employer], 4.5x3.1 (in/yd) (Generic) Every Other Day/30 Days Discharge Instructions: Secure with Kerlix as directed. Electronic Signature(s) Signed: 08/22/2021 12:57:16 PM By: Kevin Shan DO Entered By: [name] on 08/22/2021 12:51:54 -------------------------------------------------------------------------------- Problem List Details Patient Name: Date of Service: [name]. 08/22/2021 10:30 A M Medical Record Number: [id number] Patient Account Number: [id number] Date of Birth/Sex: Treating RN: Nov 27, 1955 (66 y.o. [name], [name] Primary Care Provider: Carolee [name] Other Clinician: Referring Provider: Treating Provider/Extender: [name] in Treatment: 24 Active Problems ICD-10 Encounter Code Description Active Date MDM Diagnosis L97.528 Non-pressure chronic ulcer of other part of left foot with other specified 06/02/2021 No Yes severity T81.31XD Disruption of external operation (surgical) wound, not elsewhere classified, 06/02/2021 No Yes subsequent encounter E11.621 Type 2 diabetes mellitus with foot ulcer 03/06/2021 No Yes J44.9 Chronic obstructive pulmonary disease, unspecified 03/06/2021 No Yes Inactive Problems ICD-10 Code Description Active Date Inactive Date S91.302A Unspecified open wound, left foot, initial encounter 03/06/2021 03/06/2021 Resolved Problems Electronic  Signature(s) Signed: 08/22/2021 12:57:16 PM By: Kevin Shan DO Entered By: [name] on 08/22/2021 12:50:26 -------------------------------------------------------------------------------- Progress Note Details Patient Name: Date of Service: [name]. 08/22/2021 10:30 A M Medical Record Number: [id number] Patient Account Number: [id number] Date of Birth/Sex: Treating RN: 06/16/1956 (66 y.o. [name] Primary Care Provider: Carolee [name] Other Clinician: Referring Provider: Treating Provider/Extender: [name] in Treatment: 24 Subjective Chief Complaint Information obtained from Patient Nonhealing wound secondary to Wound dehiscence status post transmetatarsal amputation on the left History of Present Illness (HPI) Admission 8/18 Mr. Gianluca Chhim is a 66 year old male with a past medical history of uncontrolled insulin-dependent type 2 diabetes, COPD On O2 6 L via nasal cannula That presents to the clinic for a 31-month history of nonhealing wound to the left great toe amputation site. He has been following with podiatry for a wound to his left great toe that developed osteomyelitis and subsequently required amputation. He had the surgery done on 6/10. He had a partial first ray amputation by [name]. Since his surgery he has been placing iodine on the wound bed. He states that there has been no improvement to the wound healing in the past 2 months. He states he has occasional intermittent pain to the area. He denies systemic signs of infection. 8/25; patient presents for 1 week follow-up. He states he has been using silver alginate daily without issues. He has no complaints today. He denies signs of infection. He is scheduled to have his MRI on 8/27. 9/15; patient presents for follow-up. He missed his last clinic appointment due to feeling ill. He continues to use silver alginate daily without any issues. He reports improvement in  wound healing. He currently denies signs of infection. 9/27; patient presents for follow-up. He has developed a new wound to the second left toe. He is unaware of this and was noted on exam today. He continues to use silver alginate to the amputation site wound. He has no issues or complaints today. He currently denies signs of infection. 10/4; patient presents for follow-up. He is using silver alginate to the wound sites. He has no issues or complaints today. He denies systemic signs of infection. 10/10; patient presents for follow-up. He has been using silver alginate to the wound sites. He states that his foot became more swollen over the  weekend, But has since improved. He overall feels okay. Admission 06/02/2021 Patient presents for nonhealing wound secondary to wound dehiscence following a transmetatarsal amputation. He states he has been using Betadine wet-to- dry dressings to the wound bed. He states the wound has been open for the past 3 weeks. He currently denies Pain or signs of infection. 11/21; patient presents for follow-up. He has home health but states this will end in 2 weeks per insurance. He has no issues or complaints today. He denies signs of infection. 11/28; patient presents for follow-up. He states that home health will be stopped at the end of the week. He has no issues or complaints today. 12/12; patient presents for follow-up. He states he saw [name] on 11/30 and was prescribed doxycycline for cellulitis. He currently reports more erythema and pain over the past week. He states his sugars have been very high and in the 400s. His family encouraged him to go to the ED over the weekend however he declined. 1/20; patient presents for follow-up. He reports having an arteriogram with stenting placed to the left lower extremity. He has been using Hydrofera Blue to the wound bed with improvement in wound healing. He denies signs of infection. 2/3; patient presents for  follow-up. He has been using Hydrofera Blue to the wound beds without issues. He reports improvement in wound healing. He denies signs of infection. Patient History Information obtained from Patient. Family History Cancer - Maternal Grandparents,Paternal Grandparents,Father,Mother, Diabetes - Mother,Father, Heart Disease - Maternal Grandparents,Mother,Father, Kidney Disease - Maternal Grandparents, Lung Disease - Father, Stroke - Paternal Grandparents,Maternal Grandparents,Mother, No family history of Hereditary Spherocytosis, Hypertension, Seizures, Thyroid Problems, Tuberculosis. Social History Current every day smoker - 1/2 pack a day, Alcohol Use - Never, Drug Use - No History, Caffeine Use - Daily - coffee, tea. Medical History Respiratory Patient has history of Chronic Obstructive Pulmonary Disease (COPD), Sleep Apnea - does not use CPAP Cardiovascular Patient has history of Coronary Artery Disease - mx vessel, Hypertension Gastrointestinal Denies history of Cirrhosis , Colitis, Crohnoos, Hepatitis A, Hepatitis B, Hepatitis C Endocrine Patient has history of Type II Diabetes - last A1c - 13.3 Hospitalization/Surgery History - appendectomy. - cholecystectomy. - laproscopic nephrectomy. - lapro lysis of lesions. - thorascopy. - lung biopsy. - bronchoscopy. - (L) heart cath with angio X3. - endarcdectomy. - patch angioplasty. - left great toe amp. December 27, 2020. Medical A Surgical History Notes nd Constitutional Symptoms (General Health) noncompliance , cocaine abuse ,hepatitis ,headaches Respiratory h/o pneumonia , end stage chronic lung disease , COPD/ILD Cardiovascular h/o CVA , hyperlipidemia , coronary atherosclerosis , acute on chronic diastolic heart failure LVEF 60%-65% Gastrointestinal cholecystitis , GERD Genitourinary h/o nephrolithiasis , h/o renal cell carcinoma , CKD Stage 3 Musculoskeletal arthritis Psychiatric anxiety ,  depression Objective Constitutional respirations regular, non-labored and within target range for patient.. Vitals Time Taken: 10:44 AM, Height: 69 in, Source: Stated, Weight: 215 lbs, Source: Stated, BMI: 31.7, Temperature: 97.6 F, Pulse: 91 bpm, Respiratory Rate: 20 breaths/min, Blood Pressure: 153/76 mmHg. Cardiovascular 2+ dorsalis pedis/posterior tibialis pulses. Psychiatric pleasant and cooperative. General Notes: Left foot: Transmetatarsal amputation. Open wound with granulation tissue and scant nonviable tissue. No probing to bone. No drainage noted. Improvement in healing since last clinic visit. Integumentary (Hair, Skin) Wound #7 status is Open. Original cause of wound was Surgical Injury. The date acquired was: 05/20/2021. The wound has been in treatment 11 weeks. The wound is located on the Left Amputation Site - Transmetatarsal. The wound  measures 0.3cm length x 0.4cm width x 0.5cm depth; 0.094cm^2 area and 0.047cm^3 volume. There is Fat Layer (Subcutaneous Tissue) exposed. There is no tunneling or undermining noted. There is a medium amount of serosanguineous drainage noted. The wound margin is well defined and not attached to the wound base. There is medium (34-66%) pink, pale granulation within the wound bed. There is a medium (34-66%) amount of necrotic tissue within the wound bed. Assessment Active Problems ICD-10 Non-pressure chronic ulcer of other part of left foot with other specified severity Disruption of external operation (surgical) wound, not elsewhere classified, subsequent encounter Type 2 diabetes mellitus with foot ulcer Chronic obstructive pulmonary disease, unspecified Patient's wound continues to show improvement in size and appearance. I recommended continuing Hydrofera Blue. Follow-up in 2 week. Plan Follow-up Appointments: Return Appointment in 2 weeks. - with Dr. Heber Stoddard -Monitor/Control Blood Sugar -Stop/Decrease Smoking Bathing/ Shower/  Hygiene: May shower with protection but do not get wound dressing(s) wet. Edema Control - Lymphedema / SCD / Other: Elevate legs to the level of the heart or above for 30 minutes daily and/or when sitting, a frequency of: - throughout the day Avoid standing for long periods of time. Moisturize legs daily. - both legs and feet every night before bed. Off-Loading: DH Walker Boot to: - left foot per surgeon Additional Orders / Instructions: Follow Nutritious Diet - Monitor and keep blood sugars well controlled Home Health: No change in wound care orders this week; continue Home Health for wound care. May utilize formulary equivalent dressing for wound treatment orders unless otherwise specified. - Skilled nursing for wound care 3x a week Other Home Health Orders/Instructions: - [place] #7: - Amputation Site - Transmetatarsal Wound Laterality: Left Cleanser: Soap and Water Every Other Day/30 Days Discharge Instructions: May shower and wash wound with dial antibacterial soap and water prior to dressing change. Cleanser: Wound Cleanser Every Other Day/30 Days Discharge Instructions: Cleanse the wound with wound cleanser prior to applying a clean dressing using gauze sponges, not tissue or cotton balls. Topical: Gentamicin Every Other Day/30 Days Discharge Instructions: As directed by physician ****In office only*** Prim Dressing: Hydrofera Blue Classic Foam, 4x4 in (Generic) Every Other Day/30 Days ary Discharge Instructions: Moisten with saline prior to applying to wound bed Secondary Dressing: Woven Gauze Sponge, Non-Sterile 4x4 in (Generic) Every Other Day/30 Days Discharge Instructions: Apply over primary dressing as directed. Secondary Dressing: ABD Pad, 5x9 (Generic) Every Other Day/30 Days Discharge Instructions: Apply over primary dressing as directed. Secured With: Elastic Bandage 4 inch (ACE bandage) (Generic) Every Other Day/30 Days Discharge Instructions: Secure  with ACE bandage as directed. Secured With: [employer], 4.5x3.1 (in/yd) (Generic) Every Other Day/30 Days Discharge Instructions: Secure with Kerlix as directed. 1. Hydrofera Blue 2. Aggressive offloading 3. Follow-up in 2 weeks Electronic Signature(s) Signed: 08/22/2021 12:57:16 PM By: Kevin Shan DO Entered By: [name] on 08/22/2021 12:52:43 -------------------------------------------------------------------------------- HxROS Details Patient Name: Date of Service: [name]. 08/22/2021 10:30 A M Medical Record Number: [id number] Patient Account Number: [id number] Date of Birth/Sex: Treating RN: 08/06/1955 (66 y.o. [name] Primary Care Provider: Carolee [name] Other Clinician: Referring Provider: Treating Provider/Extender: [name] in Treatment: 24 Information Obtained From Patient Constitutional Symptoms (General Health) Medical History: Past Medical History Notes: noncompliance , cocaine abuse ,hepatitis ,headaches Respiratory Medical History: Positive for: Chronic Obstructive Pulmonary Disease (COPD); Sleep Apnea - does not use CPAP Past Medical History Notes: h/o pneumonia , end stage chronic lung disease ,  COPD/ILD Cardiovascular Medical History: Positive for: Coronary Artery Disease - mx vessel; Hypertension Past Medical History Notes: h/o CVA , hyperlipidemia , coronary atherosclerosis , acute on chronic diastolic heart failure LVEF 60%-65% Gastrointestinal Medical History: Negative for: Cirrhosis ; Colitis; Crohns; Hepatitis A; Hepatitis B; Hepatitis C Past Medical History Notes: cholecystitis , GERD Endocrine Medical History: Positive for: Type II Diabetes - last A1c - 13.3 Genitourinary Medical History: Past Medical History Notes: h/o nephrolithiasis , h/o renal cell carcinoma , CKD Stage 3 Musculoskeletal Medical History: Past Medical History Notes: arthritis Psychiatric Medical  History: Past Medical History Notes: anxiety , depression Immunizations Pneumococcal Vaccine: Received Pneumococcal Vaccination: Yes Received Pneumococcal Vaccination On or After 60th Birthday: Yes Implantable Devices None Hospitalization / Surgery History Type of Hospitalization/Surgery appendectomy cholecystectomy laproscopic nephrectomy lapro lysis of lesions thorascopy lung biopsy bronchoscopy (L) heart cath with angio X3 endarcdectomy patch angioplasty left great toe amp. December 27, 2020 Family and Social History Cancer: Yes - Maternal Grandparents,Paternal Grandparents,Father,Mother; Diabetes: Yes - Mother,Father; Heart Disease: Yes - Maternal Grandparents,Mother,Father; Hereditary Spherocytosis: No; Hypertension: No; Kidney Disease: Yes - Maternal Grandparents; Lung Disease: Yes - Father; Seizures: No; Stroke: Yes - Paternal Grandparents,Maternal Grandparents,Mother; Thyroid Problems: No; Tuberculosis: No; Current every day smoker - 1/2 pack a day; Alcohol Use: Never; Drug Use: No History; Caffeine Use: Daily - coffee, tea; Financial Concerns: No; Food, Clothing or Shelter Needs: No; Support System Lacking: No; Transportation Concerns: No Electronic Signature(s) Signed: 08/22/2021 12:57:16 PM By: Kevin Shan DO Signed: 08/25/2021 4:37:55 PM By: Baruch Gouty RN, BSN Entered By: [name] on 08/22/2021 12:51:10 -------------------------------------------------------------------------------- [place] Details Patient Name: Date of Service: [name] 08/22/2021 Medical Record Number: [id number] Patient Account Number: [id number] Date of Birth/Sex: Treating RN: 1955-09-02 (66 y.o. [name], [name] Primary Care Provider: Carolee [name] Other Clinician: Referring Provider: Treating Provider/Extender: [name] in Treatment: 24 Diagnosis Coding ICD-10 Codes Code Description [phone] Non-pressure chronic ulcer of other part of  left foot with other specified severity T81.31XD Disruption of external operation (surgical) wound, not elsewhere classified, subsequent encounter E11.621 Type 2 diabetes mellitus with foot ulcer J44.9 Chronic obstructive pulmonary disease, unspecified Facility Procedures CPT4 Code: [id number] Description: 99214 - WOUND CARE VISIT-LEV 4 EST PT Modifier: Quantity: 1 Physician Procedures : CPT4 Code Description Modifier [id number] - WC PHYS LEVEL 3 - EST PT ICD-10 Diagnosis Description L97.528 Non-pressure chronic ulcer of other part of left foot with other specified severity T81.31XD Disruption of external operation (surgical) wound,  not elsewhere classified, subsequent encounter E11.621 Type 2 diabetes mellitus with foot ulcer J44.9 Chronic obstructive pulmonary disease, unspecified Quantity: 1 Electronic Signature(s) Signed: 08/22/2021 12:57:16 PM By: Kevin Shan DO Entered By: [name] on 08/22/2021 12:52:56

## 2021-08-25 NOTE — Progress Notes (Signed)
DONTRAY HABERLAND ([id number]) , Visit Report for 08/22/2021 Arrival Information Details Patient Name: Date of Service: MOHAMAD, BRUSO 08/22/2021 10:30 A M Medical Record Number: [id number] Patient Account Number: [id number] Date of Birth/Sex: Treating RN: 1955-12-29 (66 y.o. Collene Gobble Primary Care Davine Sweney: Carolee Rota Other Clinician: Referring Franke Menter: Treating Kerin Cecchi/Extender: Maryagnes Amos in Treatment: 24 Visit Information History Since Last Visit Added or deleted any medications: No Patient Arrived: Ambulatory Any new allergies or adverse reactions: No Arrival Time: 10:41 Had a fall or experienced change in No Accompanied By: friend activities of daily living that may affect Transfer Assistance: None risk of falls: Patient Identification Verified: Yes Signs or symptoms of abuse/neglect since last visito No Patient Requires Transmission-Based Precautions: No Hospitalized since last visit: No Patient Has Alerts: Yes Implantable device outside of the clinic excluding No Patient Alerts: ABI's: R: 0.82 L:0.76 cellular tissue based products placed in the center since last visit: Has Dressing in Place as Prescribed: Yes Pain Present Now: No Electronic Signature(s) Signed: 08/22/2021 1:19:39 PM By: Dellie Catholic RN Entered By: Dellie Catholic on 08/22/2021 10:42:49 -------------------------------------------------------------------------------- Clinic Level of Care Assessment Details Patient Name: Date of Service: EFFIE, JANOSKI 08/22/2021 10:30 A M Medical Record Number: [id number] Patient Account Number: [id number] Date of Birth/Sex: Treating RN: [birth year] (66 y.o. [name], [name] Primary Care Terrilee Dudzik: Carolee Rota Other Clinician: Referring Arnette Driggs: Treating Willy Pinkerton/Extender: Maryagnes Amos in Treatment: 24 Clinic Level of Care Assessment Items TOOL 4 Quantity Score X- 1 0 Use when only an EandM is  performed on FOLLOW-UP visit ASSESSMENTS - Nursing Assessment / Reassessment X- 1 10 Reassessment of Co-morbidities (includes updates in patient status) X- 1 5 Reassessment of Adherence to Treatment Plan ASSESSMENTS - Wound and Skin A ssessment / Reassessment X - Simple Wound Assessment / Reassessment - one wound 1 5 []  - 0 Complex Wound Assessment / Reassessment - multiple wounds X- 1 10 Dermatologic / Skin Assessment (not related to wound area) ASSESSMENTS - Focused Assessment X- 1 5 Circumferential Edema Measurements - multi extremities X- 1 10 Nutritional Assessment / Counseling / Intervention []  - 0 Lower Extremity Assessment (monofilament, tuning fork, pulses) []  - 0 Peripheral Arterial Disease Assessment (using hand held doppler) ASSESSMENTS - Ostomy and/or Continence Assessment and Care []  - 0 Incontinence Assessment and Management []  - 0 Ostomy Care Assessment and Management (repouching, etc.) PROCESS - Coordination of Care X - Simple Patient / Family Education for ongoing care 1 15 []  - 0 Complex (extensive) Patient / Family Education for ongoing care X- 1 10 Staff obtains Consents, Records, T Results / Process Orders est X- 1 10 Staff telephones HHA, Nursing Homes / Clarify orders / etc []  - 0 Routine Transfer to another Facility (non-emergent condition) []  - 0 Routine Hospital Admission (non-emergent condition) []  - 0 New Admissions / Biomedical engineer / Ordering NPWT Apligraf, etc. , []  - 0 Emergency Hospital Admission (emergent condition) X- 1 10 Simple Discharge Coordination []  - 0 Complex (extensive) Discharge Coordination PROCESS - Special Needs []  - 0 Pediatric / Minor Patient Management []  - 0 Isolation Patient Management []  - 0 Hearing / Language / Visual special needs []  - 0 Assessment of Community assistance (transportation, D/C planning, etc.) []  - 0 Additional assistance / Altered mentation []  - 0 Support Surface(s) Assessment  (bed, cushion, seat, etc.) INTERVENTIONS - Wound Cleansing / Measurement X - Simple Wound Cleansing - one wound 1 5 []  - 0 Complex Wound Cleansing - multiple  wounds X- 1 5 Wound Imaging (photographs - any number of wounds) $RemoveBe'[]'QGlYPmCfS$  - 0 Wound Tracing (instead of photographs) X- 1 5 Simple Wound Measurement - one wound $RemoveB'[]'BlavdzBA$  - 0 Complex Wound Measurement - multiple wounds INTERVENTIONS - Wound Dressings $RemoveBeforeD'[]'EYJdbUIdrszvEo$  - 0 Small Wound Dressing one or multiple wounds X- 1 15 Medium Wound Dressing one or multiple wounds $RemoveBeforeD'[]'QzRqjXfQzqWqvG$  - 0 Large Wound Dressing one or multiple wounds $RemoveBeforeD'[]'qEqFkuWoFAAUtq$  - 0 Application of Medications - topical $RemoveB'[]'cMvCKyAX$  - 0 Application of Medications - injection INTERVENTIONS - Miscellaneous $RemoveBeforeD'[]'VlEwDCxBJDIktn$  - 0 External ear exam $Remove'[]'QeeexFB$  - 0 Specimen Collection (cultures, biopsies, blood, body fluids, etc.) $RemoveBefor'[]'ioewbhRtFJTH$  - 0 Specimen(s) / Culture(s) sent or taken to Lab for analysis $RemoveBefo'[]'xEHfwcuMgkV$  - 0 Patient Transfer (multiple staff / Civil Service fast streamer / Similar devices) $RemoveBeforeDE'[]'oIGJcmnxveKwcoJ$  - 0 Simple Staple / Suture removal (25 or less) $Remove'[]'PIMcAOr$  - 0 Complex Staple / Suture removal (26 or more) $Remove'[]'dCxkxuj$  - 0 Hypo / Hyperglycemic Management (close monitor of Blood Glucose) $RemoveBefore'[]'QIWTQpHKQXCWb$  - 0 Ankle / Brachial Index (ABI) - do not check if billed separately X- 1 5 Vital Signs Has the patient been seen at the hospital within the last three years: Yes Total Score: 125 Level Of Care: New/Established - Level 4 Electronic Signature(s) Signed: 08/22/2021 1:48:22 PM By: Deon Pilling RN, BSN Entered By: Deon Pilling on 08/22/2021 11:27:26 -------------------------------------------------------------------------------- Encounter Discharge Information Details Patient Name: Date of Service: [name]. 08/22/2021 10:30 A M Medical Record Number: [id number] Patient Account Number: [id number] Date of Birth/Sex: Treating RN: 1955/11/15 (66 y.o. [name] Primary Care Delante Karapetyan: Carolee Rota Other Clinician: Referring Latanza Pfefferkorn: Treating Jilliane Kazanjian/Extender: Maryagnes Amos in Treatment: 24 Encounter Discharge Information Items Discharge Condition: Stable Ambulatory Status: Cane Discharge Destination: Home Transportation: Private Auto Accompanied By: family member Schedule Follow-up Appointment: Yes Clinical Summary of Care: Electronic Signature(s) Signed: 08/22/2021 1:48:22 PM By: Deon Pilling RN, BSN Entered By: Deon Pilling on 08/22/2021 11:28:05 -------------------------------------------------------------------------------- Lower Extremity Assessment Details Patient Name: Date of Service: [name]. 08/22/2021 10:30 A M Medical Record Number: [id number] Patient Account Number: [id number] Date of Birth/Sex: Treating RN: Dec 03, 1955 (66 y.o. Collene Gobble Primary Care Yashvi Jasinski: Carolee Rota Other Clinician: Referring Alanee Ting: Treating Omar Orrego/Extender: Maryagnes Amos in Treatment: 24 Edema Assessment Assessed: [name]: No] [name]: No] Edema: [Left: N] [Right: o] Calf Left: Right: Point of Measurement: 37 cm From Medial Instep 37 cm Ankle Left: Right: Point of Measurement: 8 cm From Medial Instep 26 cm Electronic Signature(s) Signed: 08/22/2021 1:19:39 PM By: Dellie Catholic RN Entered By: Dellie Catholic on 08/22/2021 10:46:50 -------------------------------------------------------------------------------- Multi Wound Chart Details Patient Name: Date of Service: [name]. 08/22/2021 10:30 A M Medical Record Number: [id number] Patient Account Number: [id number] Date of Birth/Sex: Treating RN: Nov 25, 1955 (66 y.o. [name] Primary Care Perian Tedder: Carolee Rota Other Clinician: Referring Ruthy Forry: Treating Gerald Kuehl/Extender: Maryagnes Amos in Treatment: 24 Vital Signs Height(in): 46 Pulse(bpm): 80 Weight(lbs): 215 Blood Pressure(mmHg): 153/76 Body Mass Index(BMI): 31.7 Temperature(F): 97.6 Respiratory Rate(breaths/min): 20 Photos: [N/A:N/A] Left  Amputation Site - Transmetatarsal N/A N/A Wound Location: Surgical Injury N/A N/A Wounding Event: Dehisced Wound N/A N/A Primary Etiology: Chronic Obstructive Pulmonary N/A N/A Comorbid History: Disease (COPD), Sleep Apnea, Coronary Artery Disease, Hypertension, Type II Diabetes 05/20/2021 N/A N/A Date Acquired: 11 N/A N/A Weeks of Treatment: Open N/A N/A Wound Status: No N/A N/A Wound Recurrence: 0.3x0.4x0.5 N/A N/A Measurements L x W x D (cm) 0.094 N/A N/A A (cm) : rea 0.047 N/A N/A  Volume (cm) : 98.60% N/A N/A % Reduction in Area: 98.30% N/A N/A % Reduction in Volume: Full Thickness Without Exposed N/A N/A Classification: Support Structures Medium N/A N/A Exudate Amount: Serosanguineous N/A N/A Exudate Type: red, brown N/A N/A Exudate Color: Well defined, not attached N/A N/A Wound Margin: Medium (34-66%) N/A N/A Granulation Amount: Pink, Pale N/A N/A Granulation Quality: Medium (34-66%) N/A N/A Necrotic Amount: Fat Layer (Subcutaneous Tissue): Yes N/A N/A Exposed Structures: Fascia: No Tendon: No Muscle: No Joint: No Bone: No Medium (34-66%) N/A N/A Epithelialization: Treatment Notes Wound #7 (Amputation Site - Transmetatarsal) Wound Laterality: Left Cleanser Soap and Water Discharge Instruction: May shower and wash wound with dial antibacterial soap and water prior to dressing change. Wound Cleanser Discharge Instruction: Cleanse the wound with wound cleanser prior to applying a clean dressing using gauze sponges, not tissue or cotton balls. Peri-Wound Care Topical Gentamicin Discharge Instruction: As directed by physician ****In office only*** Primary Dressing Hydrofera Blue Classic Foam, 4x4 in Discharge Instruction: Moisten with saline prior to applying to wound bed Secondary Dressing Woven Gauze Sponge, Non-Sterile 4x4 in Discharge Instruction: Apply over primary dressing as directed. ABD Pad, 5x9 Discharge Instruction: Apply over  primary dressing as directed. Secured With Elastic Bandage 4 inch (ACE bandage) Discharge Instruction: Secure with ACE bandage as directed. Kerlix Roll Sterile, 4.5x3.1 (in/yd) Discharge Instruction: Secure with Kerlix as directed. Compression Wrap Compression Stockings Add-Ons Electronic Signature(s) Signed: 08/22/2021 12:57:16 PM By: Kalman Shan DO Signed: 08/25/2021 4:37:55 PM By: Baruch Gouty RN, BSN Entered By: Kalman Shan on 08/22/2021 12:50:31 -------------------------------------------------------------------------------- Multi-Disciplinary Care Plan Details Patient Name: Date of Service: [name]. 08/22/2021 10:30 A M Medical Record Number: [id number] Patient Account Number: [id number] Date of Birth/Sex: Treating RN: January 08, 1956 (66 y.o. [name], [name] Primary Care Inice Sanluis: Carolee Rota Other Clinician: Referring Symphony Demuro: Treating Javae Braaten/Extender: Maryagnes Amos in Treatment: 24 Multidisciplinary Care Plan reviewed with physician Active Inactive Nutrition Nursing Diagnoses: Impaired glucose control: actual or potential Goals: Patient/caregiver verbalizes understanding of need to maintain therapeutic glucose control per primary care physician Date Initiated: 06/30/2021 Target Resolution Date: 10/17/2021 Goal Status: Active Patient/caregiver will maintain therapeutic glucose control Date Initiated: 06/30/2021 Target Resolution Date: 10/17/2021 Goal Status: Active Interventions: Assess HgA1c results as ordered upon admission and as needed Provide education on elevated blood sugars and impact on wound healing Provide education on nutrition Treatment Activities: Education provided on Nutrition : 06/30/2021 Obtain HgA1c : 06/30/2021 Patient referred to Primary Care Physician for further nutritional evaluation : 06/30/2021 Notes: Wound/Skin Impairment Nursing Diagnoses: Impaired tissue integrity Knowledge deficit related  to ulceration/compromised skin integrity Goals: Patient will have a decrease in wound volume by X% from date: (specify in notes) Date Initiated: 03/06/2021 Date Inactivated: 04/15/2021 Target Resolution Date: 03/26/2021 Goal Status: Met Patient/caregiver will verbalize understanding of skin care regimen Date Initiated: 03/06/2021 Target Resolution Date: 10/17/2021 Goal Status: Active Ulcer/skin breakdown will have a volume reduction of 30% by week 4 Date Initiated: 03/06/2021 Date Inactivated: 04/15/2021 Target Resolution Date: 03/26/2021 Goal Status: Unmet Unmet Reason: probable osteomyelitis Ulcer/skin breakdown will have a volume reduction of 50% by week 8 Date Initiated: 04/15/2021 Date Inactivated: 04/21/2021 Target Resolution Date: 04/22/2021 Goal Status: Met Ulcer/skin breakdown will have a volume reduction of 80% by week 12 Date Initiated: 04/21/2021 Target Resolution Date: 10/10/2021 Goal Status: Active Interventions: Assess patient/caregiver ability to obtain necessary supplies Assess patient/caregiver ability to perform ulcer/skin care regimen upon admission and as needed Assess ulceration(s) every visit Notes: 06/02/21: Patient had transmet 10/27.  Incision dehisced, wound care ongoing. Electronic Signature(s) Signed: 08/22/2021 1:48:22 PM By: Deon Pilling RN, BSN Entered By: Deon Pilling on 08/22/2021 11:26:27 -------------------------------------------------------------------------------- Pain Assessment Details Patient Name: Date of Service: [name]. 08/22/2021 10:30 A M Medical Record Number: [id number] Patient Account Number: [id number] Date of Birth/Sex: Treating RN: June 16, 1956 (67 y.o. Collene Gobble Primary Care Samari Bittinger: Carolee Rota Other Clinician: Referring Lucille Crichlow: Treating Glendene Wyer/Extender: Maryagnes Amos in Treatment: 24 Active Problems Location of Pain Severity and Description of Pain Patient Has Paino No Site  Locations Pain Management and Medication Current Pain Management: Electronic Signature(s) Signed: 08/22/2021 1:19:39 PM By: Dellie Catholic RN Entered By: Dellie Catholic on 08/22/2021 10:44:40 -------------------------------------------------------------------------------- Patient/Caregiver Education Details Patient Name: Date of Service: Giglia, Amir W. 2/3/2023andnbsp10:30 [place] Record Number: [id number] Patient Account Number: [id number] Date of Birth/Gender: Treating RN: 04/20/1956 (66 y.o. [name] Primary Care Physician: Carolee Rota Other Clinician: Referring Physician: Treating Physician/Extender: Maryagnes Amos in Treatment: 24 Education Assessment Education Provided To: Patient Education Topics Provided Nutrition: Handouts: Nutrition Methods: Explain/Verbal Responses: Reinforcements needed Electronic Signature(s) Signed: 08/22/2021 1:48:22 PM By: Deon Pilling RN, BSN Entered By: Deon Pilling on 08/22/2021 11:26:52 -------------------------------------------------------------------------------- Wound Assessment Details Patient Name: Date of Service: [name]. 08/22/2021 10:30 A M Medical Record Number: [id number] Patient Account Number: [id number] Date of Birth/Sex: Treating RN: 1955-12-07 (65 y.o. Collene Gobble Primary Care Tory Septer: Carolee Rota Other Clinician: Referring Eamonn Sermeno: Treating Dereona Kolodny/Extender: Maryagnes Amos in Treatment: 24 Wound Status Wound Number: 7 Primary Dehisced Wound Etiology: Wound Location: Left Amputation Site - Transmetatarsal Wound Open Wounding Event: Surgical Injury Status: Date Acquired: 05/20/2021 Comorbid Chronic Obstructive Pulmonary Disease (COPD), Sleep Apnea, Weeks Of Treatment: 11 History: Coronary Artery Disease, Hypertension, Type II Diabetes Clustered Wound: No Photos Wound Measurements Length: (cm) 0.3 Width: (cm) 0.4 Depth: (cm)  0.5 Area: (cm) 0.094 Volume: (cm) 0.047 % Reduction in Area: 98.6% % Reduction in Volume: 98.3% Epithelialization: Medium (34-66%) Tunneling: No Undermining: No Wound Description Classification: Full Thickness Without Exposed Support Structures Wound Margin: Well defined, not attached Exudate Amount: Medium Exudate Type: Serosanguineous Exudate Color: red, brown Foul Odor After Cleansing: No Slough/Fibrino Yes Wound Bed Granulation Amount: Medium (34-66%) Exposed Structure Granulation Quality: Pink, Pale Fascia Exposed: No Necrotic Amount: Medium (34-66%) Fat Layer (Subcutaneous Tissue) Exposed: Yes Tendon Exposed: No Muscle Exposed: No Joint Exposed: No Bone Exposed: No Treatment Notes Wound #7 (Amputation Site - Transmetatarsal) Wound Laterality: Left Cleanser Soap and Water Discharge Instruction: May shower and wash wound with dial antibacterial soap and water prior to dressing change. Wound Cleanser Discharge Instruction: Cleanse the wound with wound cleanser prior to applying a clean dressing using gauze sponges, not tissue or cotton balls. Peri-Wound Care Topical Gentamicin Discharge Instruction: As directed by physician ****In office only*** Primary Dressing Hydrofera Blue Classic Foam, 4x4 in Discharge Instruction: Moisten with saline prior to applying to wound bed Secondary Dressing Woven Gauze Sponge, Non-Sterile 4x4 in Discharge Instruction: Apply over primary dressing as directed. ABD Pad, 5x9 Discharge Instruction: Apply over primary dressing as directed. Secured With Elastic Bandage 4 inch (ACE bandage) Discharge Instruction: Secure with ACE bandage as directed. Kerlix Roll Sterile, 4.5x3.1 (in/yd) Discharge Instruction: Secure with Kerlix as directed. Compression Wrap Compression Stockings Add-Ons Electronic Signature(s) Signed: 08/22/2021 1:19:39 PM By: Dellie Catholic RN Signed: 08/25/2021 8:03:42 AM By: Sandre Kitty Entered By: Sandre Kitty on 08/22/2021 10:49:26 -------------------------------------------------------------------------------- Vitals Details Patient Name: Date of Service: [name]. 08/22/2021 10:30  A M Medical Record Number: [id number] Patient Account Number: [id number] Date of Birth/Sex: Treating RN: [birth year] (66 y.o. Collene Gobble Primary Care Greenlee Ancheta: Carolee Rota Other Clinician: Referring Ciana Simmon: Treating Angely Dietz/Extender: Maryagnes Amos in Treatment: 24 Vital Signs Time Taken: 10:44 Temperature (F): 97.6 Height (in): 69 Pulse (bpm): 91 Source: Stated Respiratory Rate (breaths/min): 20 Weight (lbs): 215 Blood Pressure (mmHg): 153/76 Source: Stated Reference Range: 80 - 120 mg / dl Body Mass Index (BMI): 31.7 Electronic Signature(s) Signed: 08/22/2021 1:19:39 PM By: Dellie Catholic RN Entered By: Dellie Catholic on 08/22/2021 10:44:16

## 2021-08-27 ENCOUNTER — Other Ambulatory Visit: Payer: Self-pay

## 2021-08-27 DIAGNOSIS — I739 Peripheral vascular disease, unspecified: Secondary | ICD-10-CM

## 2021-08-27 DIAGNOSIS — E1151 Type 2 diabetes mellitus with diabetic peripheral angiopathy without gangrene: Secondary | ICD-10-CM | POA: Diagnosis not present

## 2021-08-27 DIAGNOSIS — Z89432 Acquired absence of left foot: Secondary | ICD-10-CM | POA: Diagnosis not present

## 2021-08-27 DIAGNOSIS — E1142 Type 2 diabetes mellitus with diabetic polyneuropathy: Secondary | ICD-10-CM | POA: Diagnosis not present

## 2021-08-27 DIAGNOSIS — F32A Depression, unspecified: Secondary | ICD-10-CM | POA: Diagnosis not present

## 2021-08-27 DIAGNOSIS — E11621 Type 2 diabetes mellitus with foot ulcer: Secondary | ICD-10-CM | POA: Diagnosis not present

## 2021-08-27 DIAGNOSIS — G4730 Sleep apnea, unspecified: Secondary | ICD-10-CM | POA: Diagnosis not present

## 2021-08-27 DIAGNOSIS — F17210 Nicotine dependence, cigarettes, uncomplicated: Secondary | ICD-10-CM | POA: Diagnosis not present

## 2021-08-27 DIAGNOSIS — Z79891 Long term (current) use of opiate analgesic: Secondary | ICD-10-CM | POA: Diagnosis not present

## 2021-08-27 DIAGNOSIS — I10 Essential (primary) hypertension: Secondary | ICD-10-CM | POA: Diagnosis not present

## 2021-08-27 DIAGNOSIS — I69354 Hemiplegia and hemiparesis following cerebral infarction affecting left non-dominant side: Secondary | ICD-10-CM | POA: Diagnosis not present

## 2021-08-27 DIAGNOSIS — Z794 Long term (current) use of insulin: Secondary | ICD-10-CM | POA: Diagnosis not present

## 2021-08-27 DIAGNOSIS — Z7984 Long term (current) use of oral hypoglycemic drugs: Secondary | ICD-10-CM | POA: Diagnosis not present

## 2021-08-27 DIAGNOSIS — M1990 Unspecified osteoarthritis, unspecified site: Secondary | ICD-10-CM | POA: Diagnosis not present

## 2021-08-27 DIAGNOSIS — Z955 Presence of coronary angioplasty implant and graft: Secondary | ICD-10-CM | POA: Diagnosis not present

## 2021-08-27 DIAGNOSIS — Z7902 Long term (current) use of antithrombotics/antiplatelets: Secondary | ICD-10-CM | POA: Diagnosis not present

## 2021-08-27 DIAGNOSIS — Z905 Acquired absence of kidney: Secondary | ICD-10-CM | POA: Diagnosis not present

## 2021-08-27 DIAGNOSIS — K219 Gastro-esophageal reflux disease without esophagitis: Secondary | ICD-10-CM | POA: Diagnosis not present

## 2021-08-27 DIAGNOSIS — L97422 Non-pressure chronic ulcer of left heel and midfoot with fat layer exposed: Secondary | ICD-10-CM | POA: Diagnosis not present

## 2021-08-27 DIAGNOSIS — Z9981 Dependence on supplemental oxygen: Secondary | ICD-10-CM | POA: Diagnosis not present

## 2021-08-27 DIAGNOSIS — E785 Hyperlipidemia, unspecified: Secondary | ICD-10-CM | POA: Diagnosis not present

## 2021-08-27 DIAGNOSIS — J449 Chronic obstructive pulmonary disease, unspecified: Secondary | ICD-10-CM | POA: Diagnosis not present

## 2021-08-27 DIAGNOSIS — I6529 Occlusion and stenosis of unspecified carotid artery: Secondary | ICD-10-CM

## 2021-08-27 DIAGNOSIS — C641 Malignant neoplasm of right kidney, except renal pelvis: Secondary | ICD-10-CM | POA: Diagnosis not present

## 2021-08-27 DIAGNOSIS — I2510 Atherosclerotic heart disease of native coronary artery without angina pectoris: Secondary | ICD-10-CM | POA: Diagnosis not present

## 2021-08-28 DIAGNOSIS — H6122 Impacted cerumen, left ear: Secondary | ICD-10-CM | POA: Diagnosis not present

## 2021-08-30 DIAGNOSIS — G629 Polyneuropathy, unspecified: Secondary | ICD-10-CM | POA: Diagnosis not present

## 2021-08-30 DIAGNOSIS — G8929 Other chronic pain: Secondary | ICD-10-CM | POA: Diagnosis not present

## 2021-08-30 DIAGNOSIS — E1140 Type 2 diabetes mellitus with diabetic neuropathy, unspecified: Secondary | ICD-10-CM | POA: Diagnosis not present

## 2021-08-30 DIAGNOSIS — J449 Chronic obstructive pulmonary disease, unspecified: Secondary | ICD-10-CM | POA: Diagnosis not present

## 2021-09-01 ENCOUNTER — Other Ambulatory Visit: Payer: Self-pay

## 2021-09-01 ENCOUNTER — Ambulatory Visit (HOSPITAL_COMMUNITY)
Admission: RE | Admit: 2021-09-01 | Discharge: 2021-09-01 | Disposition: A | Discharge status: Home / Self Care | Payer: Medicare Other | Source: Ambulatory Visit | Attending: Surgery | Admitting: Surgery

## 2021-09-01 ENCOUNTER — Ambulatory Visit (INDEPENDENT_AMBULATORY_CARE_PROVIDER_SITE_OTHER)
Admit: 2021-09-01 | Discharge: 2021-09-01 | Disposition: A | Discharge status: Home / Self Care | Payer: Medicare Other | Attending: Surgery | Admitting: Surgery

## 2021-09-01 DIAGNOSIS — C641 Malignant neoplasm of right kidney, except renal pelvis: Secondary | ICD-10-CM | POA: Diagnosis not present

## 2021-09-01 DIAGNOSIS — E1142 Type 2 diabetes mellitus with diabetic polyneuropathy: Secondary | ICD-10-CM | POA: Diagnosis not present

## 2021-09-01 DIAGNOSIS — Z7984 Long term (current) use of oral hypoglycemic drugs: Secondary | ICD-10-CM | POA: Diagnosis not present

## 2021-09-01 DIAGNOSIS — Z89432 Acquired absence of left foot: Secondary | ICD-10-CM | POA: Diagnosis not present

## 2021-09-01 DIAGNOSIS — I739 Peripheral vascular disease, unspecified: Secondary | ICD-10-CM

## 2021-09-01 DIAGNOSIS — I69354 Hemiplegia and hemiparesis following cerebral infarction affecting left non-dominant side: Secondary | ICD-10-CM | POA: Diagnosis not present

## 2021-09-01 DIAGNOSIS — Z955 Presence of coronary angioplasty implant and graft: Secondary | ICD-10-CM | POA: Diagnosis not present

## 2021-09-01 DIAGNOSIS — I10 Essential (primary) hypertension: Secondary | ICD-10-CM | POA: Diagnosis not present

## 2021-09-01 DIAGNOSIS — F17210 Nicotine dependence, cigarettes, uncomplicated: Secondary | ICD-10-CM | POA: Diagnosis not present

## 2021-09-01 DIAGNOSIS — Z794 Long term (current) use of insulin: Secondary | ICD-10-CM | POA: Diagnosis not present

## 2021-09-01 DIAGNOSIS — E1151 Type 2 diabetes mellitus with diabetic peripheral angiopathy without gangrene: Secondary | ICD-10-CM | POA: Diagnosis not present

## 2021-09-01 DIAGNOSIS — I6529 Occlusion and stenosis of unspecified carotid artery: Secondary | ICD-10-CM | POA: Insufficient documentation

## 2021-09-01 DIAGNOSIS — E11621 Type 2 diabetes mellitus with foot ulcer: Secondary | ICD-10-CM | POA: Diagnosis not present

## 2021-09-01 DIAGNOSIS — L97422 Non-pressure chronic ulcer of left heel and midfoot with fat layer exposed: Secondary | ICD-10-CM | POA: Diagnosis not present

## 2021-09-01 DIAGNOSIS — F32A Depression, unspecified: Secondary | ICD-10-CM | POA: Diagnosis not present

## 2021-09-01 DIAGNOSIS — Z7902 Long term (current) use of antithrombotics/antiplatelets: Secondary | ICD-10-CM | POA: Diagnosis not present

## 2021-09-01 DIAGNOSIS — Z79891 Long term (current) use of opiate analgesic: Secondary | ICD-10-CM | POA: Diagnosis not present

## 2021-09-01 DIAGNOSIS — I2510 Atherosclerotic heart disease of native coronary artery without angina pectoris: Secondary | ICD-10-CM | POA: Diagnosis not present

## 2021-09-01 DIAGNOSIS — M1990 Unspecified osteoarthritis, unspecified site: Secondary | ICD-10-CM | POA: Diagnosis not present

## 2021-09-01 DIAGNOSIS — E785 Hyperlipidemia, unspecified: Secondary | ICD-10-CM | POA: Diagnosis not present

## 2021-09-01 DIAGNOSIS — Z9981 Dependence on supplemental oxygen: Secondary | ICD-10-CM | POA: Diagnosis not present

## 2021-09-01 DIAGNOSIS — K219 Gastro-esophageal reflux disease without esophagitis: Secondary | ICD-10-CM | POA: Diagnosis not present

## 2021-09-01 DIAGNOSIS — Z905 Acquired absence of kidney: Secondary | ICD-10-CM | POA: Diagnosis not present

## 2021-09-01 DIAGNOSIS — J449 Chronic obstructive pulmonary disease, unspecified: Secondary | ICD-10-CM | POA: Diagnosis not present

## 2021-09-01 DIAGNOSIS — G4730 Sleep apnea, unspecified: Secondary | ICD-10-CM | POA: Diagnosis not present

## 2021-09-04 DIAGNOSIS — F17210 Nicotine dependence, cigarettes, uncomplicated: Secondary | ICD-10-CM | POA: Diagnosis not present

## 2021-09-04 DIAGNOSIS — K219 Gastro-esophageal reflux disease without esophagitis: Secondary | ICD-10-CM | POA: Diagnosis not present

## 2021-09-04 DIAGNOSIS — E785 Hyperlipidemia, unspecified: Secondary | ICD-10-CM | POA: Diagnosis not present

## 2021-09-04 DIAGNOSIS — M1990 Unspecified osteoarthritis, unspecified site: Secondary | ICD-10-CM | POA: Diagnosis not present

## 2021-09-04 DIAGNOSIS — E1142 Type 2 diabetes mellitus with diabetic polyneuropathy: Secondary | ICD-10-CM | POA: Diagnosis not present

## 2021-09-04 DIAGNOSIS — G4730 Sleep apnea, unspecified: Secondary | ICD-10-CM | POA: Diagnosis not present

## 2021-09-04 DIAGNOSIS — I2510 Atherosclerotic heart disease of native coronary artery without angina pectoris: Secondary | ICD-10-CM | POA: Diagnosis not present

## 2021-09-04 DIAGNOSIS — F32A Depression, unspecified: Secondary | ICD-10-CM | POA: Diagnosis not present

## 2021-09-04 DIAGNOSIS — Z794 Long term (current) use of insulin: Secondary | ICD-10-CM | POA: Diagnosis not present

## 2021-09-04 DIAGNOSIS — E11621 Type 2 diabetes mellitus with foot ulcer: Secondary | ICD-10-CM | POA: Diagnosis not present

## 2021-09-04 DIAGNOSIS — Z7902 Long term (current) use of antithrombotics/antiplatelets: Secondary | ICD-10-CM | POA: Diagnosis not present

## 2021-09-04 DIAGNOSIS — C641 Malignant neoplasm of right kidney, except renal pelvis: Secondary | ICD-10-CM | POA: Diagnosis not present

## 2021-09-04 DIAGNOSIS — J449 Chronic obstructive pulmonary disease, unspecified: Secondary | ICD-10-CM | POA: Diagnosis not present

## 2021-09-04 DIAGNOSIS — Z89432 Acquired absence of left foot: Secondary | ICD-10-CM | POA: Diagnosis not present

## 2021-09-04 DIAGNOSIS — Z955 Presence of coronary angioplasty implant and graft: Secondary | ICD-10-CM | POA: Diagnosis not present

## 2021-09-04 DIAGNOSIS — I69354 Hemiplegia and hemiparesis following cerebral infarction affecting left non-dominant side: Secondary | ICD-10-CM | POA: Diagnosis not present

## 2021-09-04 DIAGNOSIS — Z79891 Long term (current) use of opiate analgesic: Secondary | ICD-10-CM | POA: Diagnosis not present

## 2021-09-04 DIAGNOSIS — Z9981 Dependence on supplemental oxygen: Secondary | ICD-10-CM | POA: Diagnosis not present

## 2021-09-04 DIAGNOSIS — Z905 Acquired absence of kidney: Secondary | ICD-10-CM | POA: Diagnosis not present

## 2021-09-04 DIAGNOSIS — Z7984 Long term (current) use of oral hypoglycemic drugs: Secondary | ICD-10-CM | POA: Diagnosis not present

## 2021-09-04 DIAGNOSIS — I10 Essential (primary) hypertension: Secondary | ICD-10-CM | POA: Diagnosis not present

## 2021-09-04 DIAGNOSIS — L97422 Non-pressure chronic ulcer of left heel and midfoot with fat layer exposed: Secondary | ICD-10-CM | POA: Diagnosis not present

## 2021-09-04 DIAGNOSIS — E1151 Type 2 diabetes mellitus with diabetic peripheral angiopathy without gangrene: Secondary | ICD-10-CM | POA: Diagnosis not present

## 2021-09-05 ENCOUNTER — Ambulatory Visit (INDEPENDENT_AMBULATORY_CARE_PROVIDER_SITE_OTHER): Payer: Medicare Other | Admitting: Physician Assistant

## 2021-09-05 ENCOUNTER — Encounter (HOSPITAL_BASED_OUTPATIENT_CLINIC_OR_DEPARTMENT_OTHER): Payer: Medicare Other | Admitting: Internal Medicine

## 2021-09-05 ENCOUNTER — Other Ambulatory Visit: Payer: Self-pay

## 2021-09-05 VITALS — BP 105/61 | HR 93 | Temp 98.0°F | Resp 20 | Ht 68.0 in | Wt 219.4 lb

## 2021-09-05 DIAGNOSIS — I6529 Occlusion and stenosis of unspecified carotid artery: Secondary | ICD-10-CM

## 2021-09-05 DIAGNOSIS — E11621 Type 2 diabetes mellitus with foot ulcer: Secondary | ICD-10-CM | POA: Diagnosis not present

## 2021-09-05 DIAGNOSIS — Z9981 Dependence on supplemental oxygen: Secondary | ICD-10-CM | POA: Diagnosis not present

## 2021-09-05 DIAGNOSIS — I739 Peripheral vascular disease, unspecified: Secondary | ICD-10-CM | POA: Diagnosis not present

## 2021-09-05 DIAGNOSIS — J449 Chronic obstructive pulmonary disease, unspecified: Secondary | ICD-10-CM | POA: Diagnosis not present

## 2021-09-05 DIAGNOSIS — L97528 Non-pressure chronic ulcer of other part of left foot with other specified severity: Secondary | ICD-10-CM | POA: Diagnosis not present

## 2021-09-05 DIAGNOSIS — Z89412 Acquired absence of left great toe: Secondary | ICD-10-CM | POA: Diagnosis not present

## 2021-09-05 DIAGNOSIS — T8781 Dehiscence of amputation stump: Secondary | ICD-10-CM | POA: Diagnosis not present

## 2021-09-05 NOTE — Progress Notes (Signed)
[facility] [place] HISTORY AND PHYSICAL   History of Present Illness:  Patient is a 66 y.o. year old male who presents for evaluation of PAD with history of non healing left TMA performed by DPM.  He is here for follow up s/p aortogram with SFA stent and evidence of three vessel run off on the angiogram by Dr. Trula Slade.  He goes to the wound care center for dressing changes.  He was seen today just prior to his appointment here.  He states they are very satisfied with the progress of healing since his endovascular intervention.    He denies non healing wounds, claudication or rest pain on the right LE.    He has history of right CEA by Dr. Donnetta Hutching 10/08/2014 and has remained asymptomatic of stroke or TIA symptoms.  He denies amaurosis, aphasia and weakness.  He does have a history of stroke/TIA in the distant past.  He is medically managed on Lipitor and Plavix daily.        Past Medical History:  Diagnosis Date   Anxiety    Arthritis    Cholecystitis, acute 12/20/2013   Lap chole 6/5   Cocaine abuse ([name])    COPD (chronic obstructive pulmonary disease) ([name])    Coronary atherosclerosis of native coronary artery    a. 03/09/2013 Cath/PCI: LM nl, LAD: 50p, 43m (2.5x16 promus DES), LCX nl, OM1 min irregs, LPL/LPDA diff dzs, RCA nondom, mod diff dzs, EF 55%.   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 ([place]) 04/24/2021   History of nephrolithiasis    History of pneumonia    History of stroke    Right MCA distribution, residual left-sided weakness   Hyperlipidemia    Noncompliance    Osteomyelitis of second toe of left foot ([place]) 04/24/2021   Renal cell carcinoma ([place])    Status post radical right nephrectomy August 2015   Sleep apnea    On CPAP, 4L O2 no cpap at home yet   Type 2 diabetes mellitus ([place]) 2011    Past Surgical History:   Procedure Laterality Date   ABDOMINAL AORTOGRAM W/LOWER EXTREMITY Left 07/17/2021   Procedure: ABDOMINAL AORTOGRAM W/LOWER EXTREMITY;  Surgeon: Serafina Mitchell, MD;  Location: [facility];  Service: Cardiovascular;  Laterality: Left;   AMPUTATION Left 12/27/2020   Procedure: PARTIAL FIRST RAY AMPUTATION OF FOOT;  Surgeon: Felipa Furnace, DPM;  Location: [facility];  Service: Podiatry;  Laterality: Left;   APPENDECTOMY  1970's   CHOLECYSTECTOMY N/A 12/22/2013   Procedure: LAPAROSCOPIC CHOLECYSTECTOMY ;  Surgeon: [name], MD;  Location: [facility];  Service: General;  Laterality: N/A;   ENDARTERECTOMY Right 10/08/2014   Procedure: Right ENDARTERECTOMY CAROTID;  Surgeon: [name], MD;  Location: [facility];  Service: Vascular;  Laterality: Right;   LAPAROSCOPIC LYSIS OF ADHESIONS  02/21/2014   Procedure: LAPAROSCOPIC LYSIS OF ADHESIONS EXTINSIVE;  Surgeon: [name], MD;  Location: [facility];  Service: Urology;;   LEFT HEART CATHETERIZATION WITH CORONARY ANGIOGRAM N/A 06/02/2012   Procedure: LEFT HEART CATHETERIZATION WITH CORONARY ANGIOGRAM;  Surgeon: [name], MD;  Location: [facility];  Service: Cardiovascular;  Laterality: N/A;   LEFT HEART CATHETERIZATION WITH CORONARY ANGIOGRAM N/A 03/09/2013   Procedure: LEFT HEART CATHETERIZATION WITH CORONARY ANGIOGRAM;  Surgeon: [name], MD;  Location: [facility];  Service: Cardiovascular;  Laterality: N/A;   LEFT HEART CATHETERIZATION WITH CORONARY ANGIOGRAM N/A 12/20/2013   Procedure: LEFT HEART CATHETERIZATION WITH CORONARY ANGIOGRAM;  Surgeon: [name], MD;  Location: [facility];  Service: Cardiovascular;  Laterality: N/A;   LUNG BIOPSY Left 05/18/2014   Procedure: LUNG BIOPSY left upper lobe & left lower lobe;  Surgeon: [name], MD;  Location: [facility];  Service: Thoracic;  Laterality: Left;   PATCH ANGIOPLASTY Right 10/08/2014   Procedure: PATCH ANGIOPLASTY Right Carotid;  Surgeon: [name], MD;   Location: [facility];  Service: Vascular;  Laterality: Right;   PERIPHERAL VASCULAR INTERVENTION Left 07/17/2021   Procedure: PERIPHERAL VASCULAR INTERVENTION;  Surgeon: Serafina Mitchell, MD;  Location: [facility];  Service: Cardiovascular;  Laterality: Left;  Superficial Femoral Artery   ROBOT ASSISTED LAPAROSCOPIC NEPHRECTOMY Right 02/21/2014   Procedure: ROBOTIC ASSISTED LAPAROSCOPIC RIGHT NEPHRECTOMY ;  Surgeon: [name], MD;  Location: [facility];  Service: Urology;  Laterality: Right;   VIDEO ASSISTED THORACOSCOPY Left 05/18/2014   Procedure: LEFT VIDEO ASSISTED THORACOSCOPY;  Surgeon: [name], MD;  Location: [facility];  Service: Thoracic;  Laterality: Left;   VIDEO BRONCHOSCOPY  05/18/2014   Procedure: VIDEO BRONCHOSCOPY;  Surgeon: [name], MD;  Location: [facility];  Service: Thoracic;;    ROS:   General:  No weight loss, Fever, chills  HEENT: No recent headaches, no nasal bleeding, no visual changes, no sore throat  Neurologic: No dizziness, blackouts, seizures. No recent symptoms of stroke or mini- stroke. No recent episodes of slurred speech, or temporary blindness.  Cardiac: No recent episodes of chest pain/pressure, no shortness of breath at rest.  No shortness of breath with exertion.  Denies history of atrial fibrillation or irregular heartbeat  Vascular: No history of rest pain in feet.  No history of claudication.  No history of non-healing ulcer, No history of DVT   Pulmonary: No home oxygen, no productive cough, no hemoptysis,  No asthma or wheezing  Musculoskeletal:  [ ]  Arthritis, [ ]  Low back pain,  [ ]  Joint pain  Hematologic:No history of hypercoagulable state.  No history of easy bleeding.  No history of anemia  Gastrointestinal: No hematochezia or melena,  No gastroesophageal reflux, no trouble swallowing  Urinary: [ ]  chronic Kidney disease, [ ]  on HD - [ ]  MWF or [ ]  TTHS, [ ]  Burning with urination, [ ]  Frequent urination, [ ]  Difficulty  urinating;   Skin: No rashes  Psychological: No history of anxiety,  No history of depression  Social History Social History   Tobacco Use   Smoking status: Every Day    Packs/day: 1.00    Years: 41.00    Pack years: 41.00    Types: Cigarettes    Start date: 05/03/1972    Passive exposure: Never   Smokeless tobacco: Never   Tobacco comments:    1/2 ppd 06/05/21  Vaping Use   Vaping Use: Never used  Substance Use Topics   Alcohol use: No    Alcohol/week: 0.0 standard drinks   Drug use: No    Types: Cocaine    Comment: last used cocaine 11/2013. but is cocaine + this admit July 2015    Family History Family History  Problem Relation Age of Onset   Cancer Mother        Thyroid - living in her 49's.   Hypertension Other    Diabetes Other  Stroke Other    Lung cancer Father    CAD Father    Cancer Maternal Grandmother        Breast   Cancer Maternal Grandfather        Throat and stomach   CAD Brother     Allergies  Allergies  Allergen Reactions   Lisinopril Other (See Comments)    Other reaction(s): worsening kidney function   Pollen Extract     Other reaction(s): Unknown   Oxycodone Itching and Nausea Only    Pt is able to tolerate if taken with benadryl     Current Outpatient Medications  Medication Sig Dispense Refill   acetaminophen (TYLENOL) 325 MG tablet Take 650 mg by mouth as needed (pain).     ALPRAZolam (XANAX) 0.5 MG tablet Take 1 tablet (0.5 mg total) by mouth 3 (three) times daily as needed for anxiety.  0   ALPRAZolam (XANAX) 1 MG tablet Take 1 mg by mouth 3 (three) times daily as needed.     atorvastatin (LIPITOR) 20 MG tablet Take 20 mg by mouth at bedtime.  1   azithromycin (ZITHROMAX) 250 MG tablet Take 2 tablets first day then 1 tablet daily until gone 6 tablet 0   Budeson-Glycopyrrol-Formoterol (BREZTRI AEROSPHERE) 160-9-4.8 MCG/ACT AERO Inhale 2 puffs into the lungs in the morning and at bedtime. 10.7 g 5   budesonide (PULMICORT) 1  MG/2ML nebulizer solution Take 1 mg by nebulization as needed (shortness of breath).     clopidogrel (PLAVIX) 75 MG tablet TAKE ONE TABLET BY MOUTH EVERY DAY (Patient taking differently: Take 75 mg by mouth daily.) 90 tablet 2   diltiazem (CARDIZEM CD) 180 MG 24 hr capsule TAKE ONE CAPSULE BY MOUTH EVERY DAY (Patient taking differently: Take 180 mg by mouth daily.) 90 capsule 2   diphenhydrAMINE (BENADRYL) 25 mg capsule Take 25 mg by mouth every 6 (six) hours as needed for itching (take with percocet).     esomeprazole (NEXIUM) 40 MG capsule Take 40 mg by mouth at bedtime. Pt only take as needed     FLUoxetine (PROZAC) 20 MG capsule Take 20 mg by mouth daily.     FLUoxetine (PROZAC) 40 MG capsule Take 40 mg by mouth daily. Take with 10mg  capsule for a total of 50mg  daily.     formoterol (PERFOROMIST) 20 MCG/2ML nebulizer solution Take 20 mcg by nebulization as needed (shortness of breath).     gabapentin (NEURONTIN) 300 MG capsule Take 900 mg by mouth 3 (three) times daily.     glucose blood (TRUE METRIX BLOOD GLUCOSE TEST) test strip      insulin regular human CONCENTRATED (HUMULIN R) 500 UNIT/ML injection Inject 90 Units into the skin 3 (three) times daily with meals.     isosorbide mononitrate (IMDUR) 60 MG 24 hr tablet TAKE ONE TABLET BY MOUTH EVERY DAY (Patient taking differently: Take 60 mg by mouth daily.) 90 tablet 1   linagliptin (TRADJENTA) 5 MG TABS tablet Take 5 mg by mouth daily.     nitroGLYCERIN (NITROSTAT) 0.4 MG SL tablet PLACE ONE tablet UNDER THE TONGUE every FIVE minutes as needed FOR CHEST PAIN. IF 3 TABLETS ARE NECESSARY CALL 911 (Patient taking differently: Place 0.4 mg under the tongue every 5 (five) minutes as needed for chest pain.) 25 tablet 3   potassium chloride SA (K-DUR,KLOR-CON) 20 MEQ tablet Take 20 mEq by mouth 2 (two) times daily.      pregabalin (LYRICA) 25 MG capsule Take 25 mg by  mouth 2 (two) times daily.     Probiotic Product (PROBIOTIC PO) Take 1 capsule by  mouth daily.     tamsulosin (FLOMAX) 0.4 MG CAPS capsule Take 0.4 mg by mouth daily.      torsemide (DEMADEX) 20 MG tablet TAKE THREE TABLETS BY MOUTH TWICE DAILY (Patient taking differently: Take 20 mg by mouth 2 (two) times daily.) 540 tablet 0   zolpidem (AMBIEN) 5 MG tablet Take 5 mg by mouth at bedtime. For insomnia.     No current facility-administered medications for this visit.    Physical Examination  Vitals:   09/05/21 1234 09/05/21 1237  BP: (!) 80/60 105/61  Pulse: 93   Resp: 20   Temp: 98 F (36.7 C)   TempSrc: Temporal   SpO2: 96%   Weight: 219 lb 6.4 oz (99.5 kg)   Height: 5\' 8"  (1.727 m)     Body mass index is 33.36 kg/m.  General:  Alert and oriented, no acute distress HEENT: Normal Neck: No bruit or JVD Pulmonary: Clear to auscultation bilaterally Cardiac: Regular Rate and Rhythm without murmur Abdomen: Soft, non-tender, non-distended, no mass, no scars Skin: No rash Extremity Pulses:  2+ radial, brachial, femoral, dorsalis pedis, posterior tibial pulses bilaterally Musculoskeletal: No deformity or edema  Neurologic: Upper and lower extremity motor 5/5 and symmetric  DATA:    ABI Findings:  +---------+------------------+-----+----------+--------+   Right     Rt Pressure (mmHg) Index Waveform   Comment    +---------+------------------+-----+----------+--------+   Brachial  118                                            +---------+------------------+-----+----------+--------+   PTA       76                 0.64  monophasic            +---------+------------------+-----+----------+--------+   DP        74                 0.63  monophasic            +---------+------------------+-----+----------+--------+   Great Toe 59                 0.50                        +---------+------------------+-----+----------+--------+   +--------+------------------+-----+----------+-------+   Left     Lt Pressure (mmHg) Index Waveform   Comment    +--------+------------------+-----+----------+-------+   Brachial 87                       monophasic           +--------+------------------+-----+----------+-------+   PTA      128                1.08  biphasic             +--------+------------------+-----+----------+-------+   DP       121                1.03  biphasic             +--------+------------------+-----+----------+-------+   +-------+-----------+-----------+------------+------------+   ABI/TBI Today's ABI Today's TBI Previous ABI Previous TBI   +-------+-----------+-----------+------------+------------+   Right   0.64  0.50        0.71         0.55           +-------+-----------+-----------+------------+------------+   Left    1.08        amputated   0.67         amputated      +-------+-----------+-----------+------------+------------+     Summary:  Right: Resting right ankle-brachial index indicates moderate right lower  extremity arterial disease. The right toe-brachial index is abnormal.   Left: Resting left ankle-brachial index is within normal range. No  evidence of significant left lower extremity arterial disease.   +----------+--------+-----+--------+--------+---------+   LEFT       PSV cm/s Ratio Stenosis Waveform Comments    +----------+--------+-----+--------+--------+---------+   CFA Prox   165                     biphasic             +----------+--------+-----+--------+--------+---------+   CFA Distal 148                     biphasic             +----------+--------+-----+--------+--------+---------+   DFA        176                     biphasic             +----------+--------+-----+--------+--------+---------+   SFA Prox                                    see stent   +----------+--------+-----+--------+--------+---------+   POP Prox   150                     biphasic             +----------+--------+-----+--------+--------+---------+   POP Distal 137                     biphasic              +----------+--------+-----+--------+--------+---------+      Left Stent(s):  +---------------+--------+--------+--------+--------+   SFA             PSV cm/s Stenosis Waveform Comments   +---------------+--------+--------+--------+--------+   Prox to Stent   139               biphasic            +---------------+--------+--------+--------+--------+   Proximal Stent  195               biphasic            +---------------+--------+--------+--------+--------+   Mid Stent       135               biphasic            +---------------+--------+--------+--------+--------+   Distal Stent    114               biphasic            +---------------+--------+--------+--------+--------+   Distal to Stent 144               biphasic            +---------------+--------+--------+--------+--------+     Summary:  Left: Left superficial femoral artery stenting appears patent  without  evidence of restenosis.      Right Carotid Findings:  +----------+--------+--------+--------+--------------------------+---------  +              PSV cm/s EDV cm/s Stenosis Plaque Description         Comments     +----------+--------+--------+--------+--------------------------+---------  +   CCA Prox   138      20                                                        +----------+--------+--------+--------+--------------------------+---------  +   CCA Mid    104      26                                                        +----------+--------+--------+--------+--------------------------+---------  +   CCA Distal 302      66       >50%     heterogenous and irregular bulb  area   +----------+--------+--------+--------+--------------------------+---------  +   ICA Prox   165      58       40-59%   heterogenous                            +----------+--------+--------+--------+--------------------------+---------  +   ICA Mid    125      39                                                         +----------+--------+--------+--------+--------------------------+---------  +   ICA Distal 114      44                                                        +----------+--------+--------+--------+--------------------------+---------  +   ECA        314      21       >50%     heterogenous                            +----------+--------+--------+--------+--------------------------+---------  +   +----------+--------+-------+----------------+-------------------+              PSV cm/s EDV cms Describe         Arm Pressure (mmHG)   +----------+--------+-------+----------------+-------------------+   Subclavian 206              Multiphasic, WNL                       +----------+--------+-------+----------------+-------------------+   +---------+--------+--+--------+--+---------+   Vertebral PSV cm/s 80 EDV cm/s 12 Antegrade   +---------+--------+--+--------+--+---------+       Left Carotid Findings:  +----------+--------+--------+--------+------------------+--------+  PSV cm/s EDV cm/s Stenosis Plaque Description Comments   +----------+--------+--------+--------+------------------+--------+   CCA Prox   69       13                                              +----------+--------+--------+--------+------------------+--------+   CCA Mid    52       13                heterogenous                  +----------+--------+--------+--------+------------------+--------+   CCA Distal 56       14                heterogenous                  +----------+--------+--------+--------+------------------+--------+   ICA Prox                     Occluded                               +----------+--------+--------+--------+------------------+--------+   ICA Mid                      Occluded                               +----------+--------+--------+--------+------------------+--------+   ICA Distal                   Occluded                                +----------+--------+--------+--------+------------------+--------+   ECA        397      89       >50%     heterogenous                  +----------+--------+--------+--------+------------------+--------+   +----------+--------+--------+--------+-------------------+              PSV cm/s EDV cm/s Describe Arm Pressure (mmHG)   +----------+--------+--------+--------+-------------------+   Subclavian 168               Stenotic                       +----------+--------+--------+--------+-------------------+   +---------+--------+--+--------+--+--------+   Vertebral PSV cm/s 57 EDV cm/s 31 Atypical   +---------+--------+--+--------+--+--------+      Summary:  Right Carotid: Velocities in the right ICA are consistent with a 40-59%                 stenosis. Hemodynamically significant plaque >50%  visualized in                 the CCA. The ECA appears >50% stenosed.   Left Carotid: Evidence consistent with a total occlusion of the left ICA.  The                ECA appears >50% stenosed.   Vertebrals:  Right vertebral artery demonstrates antegrade flow. Left  vertebral               artery demonstrates atypical flow.  Subclavians:  Left subclavian artery was stenotic. Normal flow hemodynamics  were               seen in the right subclavian artery.   ASSESSMENT/PLAN:  S/P left SFA stent placement for non healing left TMA performed by DPM. Left SFA stent is patent with biphasic flow.  There is no evidence of stenosis.   ABI's have improved on the left.  Slight decrease in TBI and ABI on the right LE with pressure of  toe of 59.  He remains asymptomatic without wounds or rest pain.  Plan repeat ABI's in 6 months.  Carotid duplex demonstrates left ICA is a know occluded artery.  Right ICA has 40-59% stenosis.  He is s/p right CEA 2016 by DR. Early.  He remains asymptomatic.  We will repeat the carotid duplex in 6 months and he will f/u for wound check in 3-4 weeks left LE.  He has  evidence of improved left LE inflow.  Continue to follow up with DPM and OP wound clinic.       Roxy Horseman PA-C [facility] [place] Office: [phone]

## 2021-09-08 ENCOUNTER — Other Ambulatory Visit: Payer: Self-pay | Admitting: Cardiology

## 2021-09-08 DIAGNOSIS — G4730 Sleep apnea, unspecified: Secondary | ICD-10-CM | POA: Diagnosis not present

## 2021-09-08 DIAGNOSIS — F32A Depression, unspecified: Secondary | ICD-10-CM | POA: Diagnosis not present

## 2021-09-08 DIAGNOSIS — J449 Chronic obstructive pulmonary disease, unspecified: Secondary | ICD-10-CM | POA: Diagnosis not present

## 2021-09-08 DIAGNOSIS — Z905 Acquired absence of kidney: Secondary | ICD-10-CM | POA: Diagnosis not present

## 2021-09-08 DIAGNOSIS — Z79891 Long term (current) use of opiate analgesic: Secondary | ICD-10-CM | POA: Diagnosis not present

## 2021-09-08 DIAGNOSIS — I10 Essential (primary) hypertension: Secondary | ICD-10-CM | POA: Diagnosis not present

## 2021-09-08 DIAGNOSIS — Z7902 Long term (current) use of antithrombotics/antiplatelets: Secondary | ICD-10-CM | POA: Diagnosis not present

## 2021-09-08 DIAGNOSIS — I69354 Hemiplegia and hemiparesis following cerebral infarction affecting left non-dominant side: Secondary | ICD-10-CM | POA: Diagnosis not present

## 2021-09-08 DIAGNOSIS — Z89432 Acquired absence of left foot: Secondary | ICD-10-CM | POA: Diagnosis not present

## 2021-09-08 DIAGNOSIS — F17210 Nicotine dependence, cigarettes, uncomplicated: Secondary | ICD-10-CM | POA: Diagnosis not present

## 2021-09-08 DIAGNOSIS — C641 Malignant neoplasm of right kidney, except renal pelvis: Secondary | ICD-10-CM | POA: Diagnosis not present

## 2021-09-08 DIAGNOSIS — Z7984 Long term (current) use of oral hypoglycemic drugs: Secondary | ICD-10-CM | POA: Diagnosis not present

## 2021-09-08 DIAGNOSIS — E1151 Type 2 diabetes mellitus with diabetic peripheral angiopathy without gangrene: Secondary | ICD-10-CM | POA: Diagnosis not present

## 2021-09-08 DIAGNOSIS — E11621 Type 2 diabetes mellitus with foot ulcer: Secondary | ICD-10-CM | POA: Diagnosis not present

## 2021-09-08 DIAGNOSIS — Z9981 Dependence on supplemental oxygen: Secondary | ICD-10-CM | POA: Diagnosis not present

## 2021-09-08 DIAGNOSIS — E785 Hyperlipidemia, unspecified: Secondary | ICD-10-CM | POA: Diagnosis not present

## 2021-09-08 DIAGNOSIS — K219 Gastro-esophageal reflux disease without esophagitis: Secondary | ICD-10-CM | POA: Diagnosis not present

## 2021-09-08 DIAGNOSIS — M1990 Unspecified osteoarthritis, unspecified site: Secondary | ICD-10-CM | POA: Diagnosis not present

## 2021-09-08 DIAGNOSIS — L97422 Non-pressure chronic ulcer of left heel and midfoot with fat layer exposed: Secondary | ICD-10-CM | POA: Diagnosis not present

## 2021-09-08 DIAGNOSIS — Z794 Long term (current) use of insulin: Secondary | ICD-10-CM | POA: Diagnosis not present

## 2021-09-08 DIAGNOSIS — Z955 Presence of coronary angioplasty implant and graft: Secondary | ICD-10-CM | POA: Diagnosis not present

## 2021-09-08 DIAGNOSIS — I2510 Atherosclerotic heart disease of native coronary artery without angina pectoris: Secondary | ICD-10-CM | POA: Diagnosis not present

## 2021-09-08 DIAGNOSIS — E1142 Type 2 diabetes mellitus with diabetic polyneuropathy: Secondary | ICD-10-CM | POA: Diagnosis not present

## 2021-09-08 NOTE — Progress Notes (Signed)
[name] ([id number]) , Visit Report for 09/05/2021 Chief Complaint Document Details Patient Name: Date of Service: [name], [name] 09/05/2021 10:30 A M Medical Record Number: [id number] Patient Account Number: [id number] Date of Birth/Sex: Treating RN: 02/03/1956 (66 y.o. [name] Primary Care Provider: Carolee Rota Other Clinician: Referring Provider: Treating Provider/Extender: Maryagnes Amos in Treatment: 26 Information Obtained from: Patient Chief Complaint Nonhealing wound secondary to Wound dehiscence status post transmetatarsal amputation on the left Electronic Signature(s) Signed: 09/05/2021 12:17:38 PM By: Kalman Shan DO Entered By: Kalman Shan on 09/05/2021 12:02:49 -------------------------------------------------------------------------------- Debridement Details Patient Name: Date of Service: [name]. 09/05/2021 10:30 A M Medical Record Number: [id number] Patient Account Number: [id number] Date of Birth/Sex: Treating RN: 1956/01/09 (65 y.o. [name], [name] Primary Care Provider: Carolee Rota Other Clinician: Referring Provider: Treating Provider/Extender: Maryagnes Amos in Treatment: 26 Debridement Performed for Assessment: Wound #7 Left Amputation Site - Transmetatarsal Performed By: Physician Kalman Shan, DO Debridement Type: Debridement Level of Consciousness (Pre-procedure): Awake and Alert Pre-procedure Verification/Time Out Yes - 11:10 Taken: Start Time: 11:11 Pain Control: Lidocaine 4% T opical Solution T Area Debrided (L x W): otal 1 (cm) x 2 (cm) = 2 (cm) Tissue and other material debrided: Viable, Non-Viable, Callus, Slough, Subcutaneous, Skin: Dermis , Skin: Epidermis, Fibrin/Exudate, Slough Level: Skin/Subcutaneous Tissue Debridement Description: Excisional Instrument: Curette Bleeding: Minimum Hemostasis Achieved: Pressure End Time: 11:15 Procedural Pain:  0 Post Procedural Pain: 0 Response to Treatment: Procedure was tolerated well Level of Consciousness (Post- Awake and Alert procedure): Post Debridement Measurements of Total Wound Length: (cm) 0.3 Width: (cm) 0.9 Depth: (cm) 0.9 Volume: (cm) 0.191 Character of Wound/Ulcer Post Debridement: Improved Post Procedure Diagnosis Same as Pre-procedure Electronic Signature(s) Signed: 09/05/2021 12:17:38 PM By: Kalman Shan DO Signed: 09/05/2021 12:57:37 PM By: Deon Pilling RN, BSN Entered By: Deon Pilling on 09/05/2021 11:16:32 -------------------------------------------------------------------------------- HPI Details Patient Name: Date of Service: [name]. 09/05/2021 10:30 A M Medical Record Number: [id number] Patient Account Number: [id number] Date of Birth/Sex: Treating RN: 02-11-1956 (66 y.o. [name] Primary Care Provider: Carolee Rota Other Clinician: Referring Provider: Treating Provider/Extender: Maryagnes Amos in Treatment: 26 History of Present Illness HPI Description: Admission 8/18 [name] is a 66 year old male with a past medical history of uncontrolled insulin-dependent type 2 diabetes, COPD On O2 6 L via nasal cannula That presents to the clinic for a 64-month history of nonhealing wound to the left great toe amputation site. He has been following with podiatry for a wound to his left great toe that developed osteomyelitis and subsequently required amputation. He had the surgery done on 6/10. He had a partial first ray amputation by Dr. Posey Pronto. Since his surgery he has been placing iodine on the wound bed. He states that there has been no improvement to the wound healing in the past 2 months. He states he has occasional intermittent pain to the area. He denies systemic signs of infection. 8/25; patient presents for 1 week follow-up. He states he has been using silver alginate daily without issues. He has no complaints  today. He denies signs of infection. He is scheduled to have his MRI on 8/27. 9/15; patient presents for follow-up. He missed his last clinic appointment due to feeling ill. He continues to use silver alginate daily without any issues. He reports improvement in wound healing. He currently denies signs of infection. 9/27; patient presents for follow-up. He has developed a new wound to the  second left toe. He is unaware of this and was noted on exam today. He continues to use silver alginate to the amputation site wound. He has no issues or complaints today. He currently denies signs of infection. 10/4; patient presents for follow-up. He is using silver alginate to the wound sites. He has no issues or complaints today. He denies systemic signs of infection. 10/10; patient presents for follow-up. He has been using silver alginate to the wound sites. He states that his foot became more swollen over the weekend, But has since improved. He overall feels okay. Admission 06/02/2021 Patient presents for nonhealing wound secondary to wound dehiscence following a transmetatarsal amputation. He states he has been using Betadine wet-to- dry dressings to the wound bed. He states the wound has been open for the past 3 weeks. He currently denies Pain or signs of infection. 11/21; patient presents for follow-up. He has home health but states this will end in 2 weeks per insurance. He has no issues or complaints today. He denies signs of infection. 11/28; patient presents for follow-up. He states that home health will be stopped at the end of the week. He has no issues or complaints today. 12/12; patient presents for follow-up. He states he saw Dr. Posey Pronto on 11/30 and was prescribed doxycycline for cellulitis. He currently reports more erythema and pain over the past week. He states his sugars have been very high and in the 400s. His family encouraged him to go to the ED over the weekend however he declined. 1/20;  patient presents for follow-up. He reports having an arteriogram with stenting placed to the left lower extremity. He has been using Hydrofera Blue to the wound bed with improvement in wound healing. He denies signs of infection. 2/3; patient presents for follow-up. He has been using Hydrofera Blue to the wound beds without issues. He reports improvement in wound healing. He denies signs of infection. 2/17; patient presents for follow-up. He has been using Hydrofera Blue to the wound beds without issues. He reports continued improvement in wound healing. He denies signs of infection. Electronic Signature(s) Signed: 09/05/2021 12:17:38 PM By: Kalman Shan DO Entered By: Kalman Shan on 09/05/2021 12:08:57 -------------------------------------------------------------------------------- Physical Exam Details Patient Name: Date of Service: [name]. 09/05/2021 10:30 A M Medical Record Number: [id number] Patient Account Number: [id number] Date of Birth/Sex: Treating RN: 18-Oct-1955 (66 y.o. [name] Primary Care Provider: Carolee Rota Other Clinician: Referring Provider: Treating Provider/Extender: Maryagnes Amos in Treatment: 26 Constitutional respirations regular, non-labored and within target range for patient.[name] Psychiatric pleasant and cooperative. Notes Left foot: Transmetatarsal amputation. Open wound with granulation tissue and nonviable tissue. No probing to bone. No drainage noted. Appears well-healing. Electronic Signature(s) Signed: 09/05/2021 12:17:38 PM By: Kalman Shan DO Entered By: Kalman Shan on 09/05/2021 12:09:52 -------------------------------------------------------------------------------- Physician Orders Details Patient Name: Date of Service: [name]. 09/05/2021 10:30 A M Medical Record Number: [id number] Patient Account Number: [id number] Date of Birth/Sex: Treating RN: 10-30-1955 (66 y.o. [name] Primary Care Provider: Carolee Rota Other Clinician: Referring Provider: Treating Provider/Extender: Maryagnes Amos in Treatment: 26 Verbal / Phone Orders: No Diagnosis Coding Follow-up Appointments ppointment in 2 weeks. - with Dr. Heber [place] Room 8 Return A -Monitor/Control Blood Sugar -Stop/Decrease Smoking Bathing/ Shower/ Hygiene May shower with protection but do not get wound dressing(s) wet. Edema Control - Lymphedema / SCD / Other Elevate legs to the level of the heart or above for 30 minutes daily and/or  when sitting, a frequency of: - throughout the day Avoid standing for long periods of time. Moisturize legs daily. - both legs and feet every night before bed. Off-Loading DH Walker Boot to: - left foot per surgeon Additional Orders / Instructions Follow Nutritious Diet - Monitor and keep blood sugars well controlled Home Health No change in wound care orders this week; continue Home Health for wound care. May utilize formulary equivalent dressing for wound treatment orders unless otherwise specified. - Skilled nursing for wound care 3x a week Other Home Health Orders/Instructions: - [place] Wound Treatment Wound #7 - Amputation Site - Transmetatarsal Wound Laterality: Left Cleanser: Soap and Water Every Other Day/30 Days Discharge Instructions: May shower and wash wound with dial antibacterial soap and water prior to dressing change. Cleanser: Wound Cleanser Every Other Day/30 Days Discharge Instructions: Cleanse the wound with wound cleanser prior to applying a clean dressing using gauze sponges, not tissue or cotton balls. Topical: Gentamicin Every Other Day/30 Days Discharge Instructions: As directed by physician ****In office only*** Prim Dressing: Hydrofera Blue Classic Foam, 4x4 in (Generic) Every Other Day/30 Days ary Discharge Instructions: Moisten with saline prior to applying to wound bed Secondary Dressing: Woven Gauze  Sponge, Non-Sterile 4x4 in (Generic) Every Other Day/30 Days Discharge Instructions: Apply over primary dressing as directed. Secondary Dressing: ABD Pad, 5x9 (Generic) Every Other Day/30 Days Discharge Instructions: Apply over primary dressing as directed. Secured With: Elastic Bandage 4 inch (ACE bandage) (Generic) Every Other Day/30 Days Discharge Instructions: Secure with ACE bandage as directed. Secured With: [employer], 4.5x3.1 (in/yd) (Generic) Every Other Day/30 Days Discharge Instructions: Secure with Kerlix as directed. Electronic Signature(s) Signed: 09/05/2021 12:17:38 PM By: Kalman Shan DO Entered By: Kalman Shan on 09/05/2021 12:10:08 -------------------------------------------------------------------------------- Problem List Details Patient Name: Date of Service: [name]. 09/05/2021 10:30 A M Medical Record Number: [id number] Patient Account Number: [id number] Date of Birth/Sex: Treating RN: 1955-10-20 (66 y.o. [name] Primary Care Provider: Carolee Rota Other Clinician: Referring Provider: Treating Provider/Extender: Maryagnes Amos in Treatment: 26 Active Problems ICD-10 Encounter Code Description Active Date MDM Diagnosis L97.528 Non-pressure chronic ulcer of other part of left foot with other specified 06/02/2021 No Yes severity T81.31XD Disruption of external operation (surgical) wound, not elsewhere classified, 06/02/2021 No Yes subsequent encounter E11.621 Type 2 diabetes mellitus with foot ulcer 03/06/2021 No Yes J44.9 Chronic obstructive pulmonary disease, unspecified 03/06/2021 No Yes Inactive Problems ICD-10 Code Description Active Date Inactive Date S91.302A Unspecified open wound, left foot, initial encounter 03/06/2021 03/06/2021 Resolved Problems Electronic Signature(s) Signed: 09/05/2021 12:17:38 PM By: Kalman Shan DO Entered By: Kalman Shan on 09/05/2021  12:02:30 -------------------------------------------------------------------------------- Progress Note Details Patient Name: Date of Service: [name]. 09/05/2021 10:30 A M Medical Record Number: [id number] Patient Account Number: [id number] Date of Birth/Sex: Treating RN: 07-13-1956 (66 y.o. [name] Primary Care Provider: Carolee Rota Other Clinician: Referring Provider: Treating Provider/Extender: Maryagnes Amos in Treatment: 26 Subjective Chief Complaint Information obtained from Patient Nonhealing wound secondary to Wound dehiscence status post transmetatarsal amputation on the left History of Present Illness (HPI) Admission 8/18 [name] is a 66 year old male with a past medical history of uncontrolled insulin-dependent type 2 diabetes, COPD On O2 6 L via nasal cannula That presents to the clinic for a 72-month history of nonhealing wound to the left great toe amputation site. He has been following with podiatry for a wound to his left great toe that developed osteomyelitis and  subsequently required amputation. He had the surgery done on 6/10. He had a partial first ray amputation by Dr. Posey Pronto. Since his surgery he has been placing iodine on the wound bed. He states that there has been no improvement to the wound healing in the past 2 months. He states he has occasional intermittent pain to the area. He denies systemic signs of infection. 8/25; patient presents for 1 week follow-up. He states he has been using silver alginate daily without issues. He has no complaints today. He denies signs of infection. He is scheduled to have his MRI on 8/27. 9/15; patient presents for follow-up. He missed his last clinic appointment due to feeling ill. He continues to use silver alginate daily without any issues. He reports improvement in wound healing. He currently denies signs of infection. 9/27; patient presents for follow-up. He has developed a  new wound to the second left toe. He is unaware of this and was noted on exam today. He continues to use silver alginate to the amputation site wound. He has no issues or complaints today. He currently denies signs of infection. 10/4; patient presents for follow-up. He is using silver alginate to the wound sites. He has no issues or complaints today. He denies systemic signs of infection. 10/10; patient presents for follow-up. He has been using silver alginate to the wound sites. He states that his foot became more swollen over the weekend, But has since improved. He overall feels okay. Admission 06/02/2021 Patient presents for nonhealing wound secondary to wound dehiscence following a transmetatarsal amputation. He states he has been using Betadine wet-to- dry dressings to the wound bed. He states the wound has been open for the past 3 weeks. He currently denies Pain or signs of infection. 11/21; patient presents for follow-up. He has home health but states this will end in 2 weeks per insurance. He has no issues or complaints today. He denies signs of infection. 11/28; patient presents for follow-up. He states that home health will be stopped at the end of the week. He has no issues or complaints today. 12/12; patient presents for follow-up. He states he saw Dr. Posey Pronto on 11/30 and was prescribed doxycycline for cellulitis. He currently reports more erythema and pain over the past week. He states his sugars have been very high and in the 400s. His family encouraged him to go to the ED over the weekend however he declined. 1/20; patient presents for follow-up. He reports having an arteriogram with stenting placed to the left lower extremity. He has been using Hydrofera Blue to the wound bed with improvement in wound healing. He denies signs of infection. 2/3; patient presents for follow-up. He has been using Hydrofera Blue to the wound beds without issues. He reports improvement in wound healing. He  denies signs of infection. 2/17; patient presents for follow-up. He has been using Hydrofera Blue to the wound beds without issues. He reports continued improvement in wound healing. He denies signs of infection. Patient History Information obtained from Patient. Family History Cancer - Maternal Grandparents,Paternal Grandparents,Father,Mother, Diabetes - Mother,Father, Heart Disease - Maternal Grandparents,Mother,Father, Kidney Disease - Maternal Grandparents, Lung Disease - Father, Stroke - Paternal Grandparents,Maternal Grandparents,Mother, No family history of Hereditary Spherocytosis, Hypertension, Seizures, Thyroid Problems, Tuberculosis. Social History Current every day smoker - 1/2 pack a day, Alcohol Use - Never, Drug Use - No History, Caffeine Use - Daily - coffee, tea. Medical History Respiratory Patient has history of Chronic Obstructive Pulmonary Disease (COPD), Sleep Apnea - does  not use CPAP Cardiovascular Patient has history of Coronary Artery Disease - mx vessel, Hypertension Gastrointestinal Denies history of Cirrhosis , Colitis, Crohnoos, Hepatitis A, Hepatitis B, Hepatitis C Endocrine Patient has history of Type II Diabetes - last A1c - 13.3 Hospitalization/Surgery History - appendectomy. - cholecystectomy. - laproscopic nephrectomy. - lapro lysis of lesions. - thorascopy. - lung biopsy. - bronchoscopy. - (L) heart cath with angio X3. - endarcdectomy. - patch angioplasty. - left great toe amp. December 27, 2020. Medical A Surgical History Notes nd Constitutional Symptoms (General Health) noncompliance , cocaine abuse ,hepatitis ,headaches Respiratory h/o pneumonia , end stage chronic lung disease , COPD/ILD Cardiovascular h/o CVA , hyperlipidemia , coronary atherosclerosis , acute on chronic diastolic heart failure LVEF 60%-65% Gastrointestinal cholecystitis , GERD Genitourinary h/o nephrolithiasis , h/o renal cell carcinoma , CKD Stage  3 Musculoskeletal arthritis Psychiatric anxiety , depression Objective Constitutional respirations regular, non-labored and within target range for patient.. Vitals Time Taken: 10:45 AM, Height: 69 in, Weight: 215 lbs, BMI: 31.7, Temperature: 98.7 F, Pulse: 90 bpm, Respiratory Rate: 17 breaths/min, Blood Pressure: 105/74 mmHg. Psychiatric pleasant and cooperative. General Notes: Left foot: Transmetatarsal amputation. Open wound with granulation tissue and nonviable tissue. No probing to bone. No drainage noted. Appears well-healing. Integumentary (Hair, Skin) Wound #7 status is Open. Original cause of wound was Surgical Injury. The date acquired was: 05/20/2021. The wound has been in treatment 13 weeks. The wound is located on the Left Amputation Site - Transmetatarsal. The wound measures 0.3cm length x 0.9cm width x 1cm depth; 0.212cm^2 area and 0.212cm^3 volume. There is Fat Layer (Subcutaneous Tissue) exposed. There is tunneling at 3:00 with a maximum distance of 0.3cm. There is a medium amount of serosanguineous drainage noted. The wound margin is well defined and not attached to the wound base. There is medium (34-66%) pink, pale granulation within the wound bed. There is a medium (34-66%) amount of necrotic tissue within the wound bed. Assessment Active Problems ICD-10 Non-pressure chronic ulcer of other part of left foot with other specified severity Disruption of external operation (surgical) wound, not elsewhere classified, subsequent encounter Type 2 diabetes mellitus with foot ulcer Chronic obstructive pulmonary disease, unspecified Patient's wound has shown improvement in size and appearance since last clinic visit. I debrided nonviable tissue. No signs of surrounding infection. I recommended continuing with Hydrofera Blue. Follow-up in 2 weeks. Procedures Wound #7 Pre-procedure diagnosis of Wound #7 is a Dehisced Wound located on the Left Amputation Site - Transmetatarsal  . There was a Excisional Skin/Subcutaneous Tissue Debridement with a total area of 2 sq cm performed by Kalman Shan, DO. With the following instrument(s): Curette to remove Viable and Non- Viable tissue/material. Material removed includes Callus, Subcutaneous Tissue, Slough, Skin: Dermis, Skin: Epidermis, and Fibrin/Exudate after achieving pain control using Lidocaine 4% T opical Solution. A time out was conducted at 11:10, prior to the start of the procedure. A Minimum amount of bleeding was controlled with Pressure. The procedure was tolerated well with a pain level of 0 throughout and a pain level of 0 following the procedure. Post Debridement Measurements: 0.3cm length x 0.9cm width x 0.9cm depth; 0.191cm^3 volume. Character of Wound/Ulcer Post Debridement is improved. Post procedure Diagnosis Wound #7: Same as Pre-Procedure Plan Follow-up Appointments: Return Appointment in 2 weeks. - with Dr. Heber [place] Room 8 -Monitor/Control Blood Sugar -Stop/Decrease Smoking Bathing/ Shower/ Hygiene: May shower with protection but do not get wound dressing(s) wet. Edema Control - Lymphedema / SCD / Other: Elevate legs to the  level of the heart or above for 30 minutes daily and/or when sitting, a frequency of: - throughout the day Avoid standing for long periods of time. Moisturize legs daily. - both legs and feet every night before bed. Off-Loading: DH Walker Boot to: - left foot per surgeon Additional Orders / Instructions: Follow Nutritious Diet - Monitor and keep blood sugars well controlled Home Health: No change in wound care orders this week; continue Home Health for wound care. May utilize formulary equivalent dressing for wound treatment orders unless otherwise specified. - Skilled nursing for wound care 3x a week Other Home Health Orders/Instructions: - [place] #7: - Amputation Site - Transmetatarsal Wound Laterality: Left Cleanser: Soap and Water Every Other Day/30  Days Discharge Instructions: May shower and wash wound with dial antibacterial soap and water prior to dressing change. Cleanser: Wound Cleanser Every Other Day/30 Days Discharge Instructions: Cleanse the wound with wound cleanser prior to applying a clean dressing using gauze sponges, not tissue or cotton balls. Topical: Gentamicin Every Other Day/30 Days Discharge Instructions: As directed by physician ****In office only*** Prim Dressing: Hydrofera Blue Classic Foam, 4x4 in (Generic) Every Other Day/30 Days ary Discharge Instructions: Moisten with saline prior to applying to wound bed Secondary Dressing: Woven Gauze Sponge, Non-Sterile 4x4 in (Generic) Every Other Day/30 Days Discharge Instructions: Apply over primary dressing as directed. Secondary Dressing: ABD Pad, 5x9 (Generic) Every Other Day/30 Days Discharge Instructions: Apply over primary dressing as directed. Secured With: Elastic Bandage 4 inch (ACE bandage) (Generic) Every Other Day/30 Days Discharge Instructions: Secure with ACE bandage as directed. Secured With: [employer], 4.5x3.1 (in/yd) (Generic) Every Other Day/30 Days Discharge Instructions: Secure with Kerlix as directed. 1. In office sharp debridement 2. Hydrofera Blue 3. Follow-up in 2 weeks Electronic Signature(s) Signed: 09/05/2021 12:17:38 PM By: Kalman Shan DO Entered By: Kalman Shan on 09/05/2021 12:15:22 -------------------------------------------------------------------------------- HxROS Details Patient Name: Date of Service: [name]. 09/05/2021 10:30 A M Medical Record Number: [id number] Patient Account Number: [id number] Date of Birth/Sex: Treating RN: [birth year] (66 y.o. [name] Primary Care Provider: Carolee Rota Other Clinician: Referring Provider: Treating Provider/Extender: Maryagnes Amos in Treatment: 26 Information Obtained From Patient Constitutional Symptoms (General  Health) Medical History: Past Medical History Notes: noncompliance , cocaine abuse ,hepatitis ,headaches Respiratory Medical History: Positive for: Chronic Obstructive Pulmonary Disease (COPD); Sleep Apnea - does not use CPAP Past Medical History Notes: h/o pneumonia , end stage chronic lung disease , COPD/ILD Cardiovascular Medical History: Positive for: Coronary Artery Disease - mx vessel; Hypertension Past Medical History Notes: h/o CVA , hyperlipidemia , coronary atherosclerosis , acute on chronic diastolic heart failure LVEF 60%-65% Gastrointestinal Medical History: Negative for: Cirrhosis ; Colitis; Crohns; Hepatitis A; Hepatitis B; Hepatitis C Past Medical History Notes: cholecystitis , GERD Endocrine Medical History: Positive for: Type II Diabetes - last A1c - 13.3 Genitourinary Medical History: Past Medical History Notes: h/o nephrolithiasis , h/o renal cell carcinoma , CKD Stage 3 Musculoskeletal Medical History: Past Medical History Notes: arthritis Psychiatric Medical History: Past Medical History Notes: anxiety , depression Immunizations Pneumococcal Vaccine: Received Pneumococcal Vaccination: Yes Received Pneumococcal Vaccination On or After 60th Birthday: Yes Implantable Devices None Hospitalization / Surgery History Type of Hospitalization/Surgery appendectomy cholecystectomy laproscopic nephrectomy lapro lysis of lesions thorascopy lung biopsy bronchoscopy (L) heart cath with angio X3 endarcdectomy patch angioplasty left great toe amp. December 27, 2020 Family and Social History Cancer: Yes - Maternal Grandparents,Paternal Grandparents,Father,Mother; Diabetes: Yes - Mother,Father; Heart  Disease: Yes - Maternal Grandparents,Mother,Father; Hereditary Spherocytosis: No; Hypertension: No; Kidney Disease: Yes - Maternal Grandparents; Lung Disease: Yes - Father; Seizures: No; Stroke: Yes - Paternal Grandparents,Maternal Grandparents,Mother; Thyroid  Problems: No; Tuberculosis: No; Current every day smoker - 1/2 pack a day; Alcohol Use: Never; Drug Use: No History; Caffeine Use: Daily - coffee, tea; Financial Concerns: No; Food, Clothing or Shelter Needs: No; Support System Lacking: No; Transportation Concerns: No Electronic Signature(s) Signed: 09/05/2021 12:17:38 PM By: Kalman Shan DO Signed: 09/08/2021 5:43:45 PM By: Baruch Gouty RN, BSN Entered By: Kalman Shan on 09/05/2021 12:09:04 -------------------------------------------------------------------------------- [place] Details Patient Name: Date of Service: [name] 09/05/2021 Medical Record Number: [id number] Patient Account Number: [id number] Date of Birth/Sex: Treating RN: 1956/01/16 (66 y.o. [name], [name] Primary Care Provider: Carolee Rota Other Clinician: Referring Provider: Treating Provider/Extender: Maryagnes Amos in Treatment: 26 Diagnosis Coding ICD-10 Codes Code Description [phone] Non-pressure chronic ulcer of other part of left foot with other specified severity T81.31XD Disruption of external operation (surgical) wound, not elsewhere classified, subsequent encounter E11.621 Type 2 diabetes mellitus with foot ulcer J44.9 Chronic obstructive pulmonary disease, unspecified Facility Procedures CPT4 Code: [id number] Description: 34035 - DEB SUBQ TISSUE 20 SQ CM/< ICD-10 Diagnosis Description L97.528 Non-pressure chronic ulcer of other part of left foot with other specified seve E11.621 Type 2 diabetes mellitus with foot ulcer Modifier: rity Quantity: 1 Physician Procedures : CPT4 Code Description Modifier [id number] - WC PHYS SUBQ TISS 20 SQ CM ICD-10 Diagnosis Description L97.528 Non-pressure chronic ulcer of other part of left foot with other specified severity E11.621 Type 2 diabetes mellitus with foot ulcer Quantity: 1 Electronic Signature(s) Signed: 09/05/2021 12:17:38 PM By: Kalman Shan DO Entered By:  Kalman Shan on 09/05/2021 12:16:07

## 2021-09-08 NOTE — Progress Notes (Signed)
[name] ([id number]) , Visit Report for 09/05/2021 Arrival Information Details Patient Name: Date of Service: [name], [name] 09/05/2021 10:30 A M Medical Record Number: [id number] Patient Account Number: [id number] Date of Birth/Sex: Treating RN: 1955/11/06 (66 y.o. [name], Lauren Primary Care Karsten Vaughn: Carolee Rota Other Clinician: Referring Coyle Stordahl: Treating Taheera Thomann/Extender: Maryagnes Amos in Treatment: 36 Visit Information History Since Last Visit Added or deleted any medications: No Patient Arrived: Ambulatory Any new allergies or adverse reactions: No Arrival Time: 10:45 Had a fall or experienced change in No Accompanied By: self activities of daily living that may affect Transfer Assistance: None risk of falls: Patient Identification Verified: Yes Signs or symptoms of abuse/neglect since last visito No Secondary Verification Process Completed: Yes Hospitalized since last visit: No Patient Requires Transmission-Based Precautions: No Implantable device outside of the clinic excluding No Patient Has Alerts: Yes cellular tissue based products placed in the center Patient Alerts: ABI's: R: 0.82 L:0.76 since last visit: Has Dressing in Place as Prescribed: Yes Pain Present Now: No Electronic Signature(s) Signed: 09/05/2021 12:52:20 PM By: Rhae Hammock RN Entered By: Rhae Hammock on 09/05/2021 10:45:46 -------------------------------------------------------------------------------- Encounter Discharge Information Details Patient Name: Date of Service: Kevin Brookes. 09/05/2021 10:30 A M Medical Record Number: [id number] Patient Account Number: [id number] Date of Birth/Sex: Treating RN: 31-May-1956 (66 y.o. [name] Primary Care Andrue Dini: Carolee Rota Other Clinician: Referring Vaida Kerchner: Treating Deneene Tarver/Extender: Maryagnes Amos in Treatment: 26 Encounter Discharge Information Items Post  Procedure Vitals Discharge Condition: Stable Temperature (F): 98.7 Ambulatory Status: Cane Pulse (bpm): 90 Discharge Destination: Home Respiratory Rate (breaths/min): 17 Transportation: Private Auto Blood Pressure (mmHg): 105/74 Accompanied By: self Schedule Follow-up Appointment: Yes Clinical Summary of Care: Electronic Signature(s) Signed: 09/05/2021 12:57:37 PM By: Deon Pilling RN, BSN Entered By: Deon Pilling on 09/05/2021 11:20:43 -------------------------------------------------------------------------------- Lower Extremity Assessment Details Patient Name: Date of Service: Kevin Brookes. 09/05/2021 10:30 A M Medical Record Number: [id number] Patient Account Number: [id number] Date of Birth/Sex: Treating RN: 12/15/1955 (66 y.o. [name], Lauren Primary Care Lianny Molter: Carolee Rota Other Clinician: Referring Farley Crooker: Treating Jana Swartzlander/Extender: Maryagnes Amos in Treatment: 26 Edema Assessment Assessed: [name]: No] [name]: No] Edema: [Left: N] [Right: o] Calf Left: Right: Point of Measurement: 37 cm From Medial Instep 37.5 cm Ankle Left: Right: Point of Measurement: 8 cm From Medial Instep 38.5 cm Vascular Assessment Pulses: Dorsalis Pedis Palpable: [Left:Yes] Posterior Tibial Palpable: [Left:Yes] Electronic Signature(s) Signed: 09/05/2021 12:52:20 PM By: Rhae Hammock RN Entered By: Rhae Hammock on 09/05/2021 10:46:46 -------------------------------------------------------------------------------- Multi Wound Chart Details Patient Name: Date of Service: Kevin Brookes. 09/05/2021 10:30 A M Medical Record Number: [id number] Patient Account Number: [id number] Date of Birth/Sex: Treating RN: [birth year] (66 y.o. [name] Primary Care Brenda Samano: Carolee Rota Other Clinician: Referring Laporchia Nakajima: Treating Shasha Buchbinder/Extender: Maryagnes Amos in Treatment: 26 Vital Signs Height(in):  21 Pulse(bpm): 90 Weight(lbs): 215 Blood Pressure(mmHg): 105/74 Body Mass Index(BMI): 31.7 Temperature(F): 98.7 Respiratory Rate(breaths/min): 17 Photos: [7:Left Amputation Site - Transmetatarsal N/A] [N/A:N/A] Wound Location: [7:Surgical Injury] [N/A:N/A] Wounding Event: [7:Dehisced Wound] [N/A:N/A] Primary Etiology: [7:Chronic Obstructive Pulmonary] [N/A:N/A] Comorbid History: [7:Disease (COPD), Sleep Apnea, Coronary Artery Disease, Hypertension, Type II Diabetes 05/20/2021] [N/A:N/A] Date Acquired: [7:13] [N/A:N/A] Weeks of Treatment: [7:Open] [N/A:N/A] Wound Status: [7:No] [N/A:N/A] Wound Recurrence: [7:0.3x0.9x1] [N/A:N/A] Measurements L x W x D (cm) [7:0.212] [N/A:N/A] A (cm) : rea [7:0.212] [N/A:N/A] Volume (cm) : [7:96.90%] [N/A:N/A] % Reduction in A [7:rea: 92.20%] [N/A:N/A] % Reduction in Volume: [7:3] Position  1 (o'clock): [7:0.3] Maximum Distance 1 (cm): [7:Yes] [N/A:N/A] Tunneling: [7:Full Thickness Without Exposed] [N/A:N/A] Classification: [7:Support Structures Medium] [N/A:N/A] Exudate A mount: [7:Serosanguineous] [N/A:N/A] Exudate Type: [7:red, brown] [N/A:N/A] Exudate Color: [7:Well defined, not attached] [N/A:N/A] Wound Margin: [7:Medium (34-66%)] [N/A:N/A] Granulation A mount: [7:Pink, Pale] [N/A:N/A] Granulation Quality: [7:Medium (34-66%)] [N/A:N/A] Necrotic A mount: [7:Fat Layer (Subcutaneous Tissue): Yes N/A] Exposed Structures: [7:Fascia: No Tendon: No Muscle: No Joint: No Bone: No Medium (34-66%)] [N/A:N/A] Epithelialization: [7:Debridement - Excisional] [N/A:N/A] Debridement: Pre-procedure Verification/Time Out 11:10 [N/A:N/A] Taken: [7:Lidocaine 4% Topical Solution] [N/A:N/A] Pain Control: [7:Callus, Subcutaneous, Slough] [N/A:N/A] Tissue Debrided: [7:Skin/Subcutaneous Tissue] [N/A:N/A] Level: [7:2] [N/A:N/A] Debridement A (sq cm): [7:rea Curette] [N/A:N/A] Instrument: [7:Minimum] [N/A:N/A] Bleeding: [7:Pressure] [N/A:N/A] Hemostasis A  chieved: [7:0] [N/A:N/A] Procedural Pain: [7:0] [N/A:N/A] Post Procedural Pain: [7:Procedure was tolerated well] [N/A:N/A] Debridement Treatment Response: [7:0.3x0.9x0.9] [N/A:N/A] Post Debridement Measurements L x W x D (cm) [7:0.191] [N/A:N/A] Post Debridement Volume: (cm) [7:Debridement] [N/A:N/A] Treatment Notes Wound #7 (Amputation Site - Transmetatarsal) Wound Laterality: Left Cleanser Soap and Water Discharge Instruction: May shower and wash wound with dial antibacterial soap and water prior to dressing change. Wound Cleanser Discharge Instruction: Cleanse the wound with wound cleanser prior to applying a clean dressing using gauze sponges, not tissue or cotton balls. Peri-Wound Care Topical Gentamicin Discharge Instruction: As directed by physician ****In office only*** Primary Dressing Hydrofera Blue Classic Foam, 4x4 in Discharge Instruction: Moisten with saline prior to applying to wound bed Secondary Dressing Woven Gauze Sponge, Non-Sterile 4x4 in Discharge Instruction: Apply over primary dressing as directed. ABD Pad, 5x9 Discharge Instruction: Apply over primary dressing as directed. Secured With Elastic Bandage 4 inch (ACE bandage) Discharge Instruction: Secure with ACE bandage as directed. Kerlix Roll Sterile, 4.5x3.1 (in/yd) Discharge Instruction: Secure with Kerlix as directed. Compression Wrap Compression Stockings Add-Ons Electronic Signature(s) Signed: 09/05/2021 12:17:38 PM By: Kalman Shan DO Signed: 09/08/2021 5:43:45 PM By: Baruch Gouty RN, BSN Entered By: Kalman Shan on 09/05/2021 12:02:37 -------------------------------------------------------------------------------- [place] Details Patient Name: Date of Service: Kevin Brookes. 09/05/2021 10:30 A M Medical Record Number: [id number] Patient Account Number: [id number] Date of Birth/Sex: Treating RN: Feb 26, 1956 (66 y.o. [name], [name] Primary Care Elisse Pennick:  Carolee Rota Other Clinician: Referring Hennesy Sobalvarro: Treating Jenel Gierke/Extender: Maryagnes Amos in Treatment: 26 Multidisciplinary Care Plan reviewed with physician Active Inactive Nutrition Nursing Diagnoses: Impaired glucose control: actual or potential Goals: Patient/caregiver verbalizes understanding of need to maintain therapeutic glucose control per primary care physician Date Initiated: 06/30/2021 Target Resolution Date: 10/17/2021 Goal Status: Active Patient/caregiver will maintain therapeutic glucose control Date Initiated: 06/30/2021 Target Resolution Date: 10/17/2021 Goal Status: Active Interventions: Assess HgA1c results as ordered upon admission and as needed Provide education on elevated blood sugars and impact on wound healing Provide education on nutrition Treatment Activities: Education provided on Nutrition : 08/22/2021 Obtain HgA1c : 06/30/2021 Patient referred to Primary Care Physician for further nutritional evaluation : 06/30/2021 Notes: Wound/Skin Impairment Nursing Diagnoses: Impaired tissue integrity Knowledge deficit related to ulceration/compromised skin integrity Goals: Patient will have a decrease in wound volume by X% from date: (specify in notes) Date Initiated: 03/06/2021 Date Inactivated: 04/15/2021 Target Resolution Date: 03/26/2021 Goal Status: Met Patient/caregiver will verbalize understanding of skin care regimen Date Initiated: 03/06/2021 Target Resolution Date: 10/17/2021 Goal Status: Active Ulcer/skin breakdown will have a volume reduction of 30% by week 4 Date Initiated: 03/06/2021 Date Inactivated: 04/15/2021 Target Resolution Date: 03/26/2021 Goal Status: Unmet Unmet Reason: probable osteomyelitis Ulcer/skin breakdown will have a volume reduction of 50% by  week 8 Date Initiated: 04/15/2021 Date Inactivated: 04/21/2021 Target Resolution Date: 04/22/2021 Goal Status: Met Ulcer/skin breakdown will have a volume  reduction of 80% by week 12 Date Initiated: 04/21/2021 Target Resolution Date: 10/10/2021 Goal Status: Active Interventions: Assess patient/caregiver ability to obtain necessary supplies Assess patient/caregiver ability to perform ulcer/skin care regimen upon admission and as needed Assess ulceration(s) every visit Notes: 06/02/21: Patient had transmet 10/27. Incision dehisced, wound care ongoing. Electronic Signature(s) Signed: 09/05/2021 12:57:37 PM By: Deon Pilling RN, BSN Entered By: Deon Pilling on 09/05/2021 11:18:37 -------------------------------------------------------------------------------- Pain Assessment Details Patient Name: Date of Service: Kevin Brookes. 09/05/2021 10:30 A M Medical Record Number: [id number] Patient Account Number: [id number] Date of Birth/Sex: Treating RN: Jul 19, 1956 (66 y.o. [name], Lauren Primary Care Rockelle Heuerman: Carolee Rota Other Clinician: Referring Mickayla Trouten: Treating Jazae Gandolfi/Extender: Maryagnes Amos in Treatment: 26 Active Problems Location of Pain Severity and Description of Pain Patient Has Paino No Site Locations Pain Management and Medication Current Pain Management: Electronic Signature(s) Signed: 09/05/2021 12:52:20 PM By: Rhae Hammock RN Entered By: Rhae Hammock on 09/05/2021 10:46:31 -------------------------------------------------------------------------------- Patient/Caregiver Education Details Patient Name: Date of Service: [name], Kevin W. 2/17/2023andnbsp10:30 [place] Record Number: [id number] Patient Account Number: [id number] Date of Birth/Gender: Treating RN: Feb 17, 1956 (65 y.o. [name] Primary Care Physician: Carolee Rota Other Clinician: Referring Physician: Treating Physician/Extender: Maryagnes Amos in Treatment: 26 Education Assessment Education Provided To: Patient Education Topics Provided Elevated Blood Sugar/ Impact on  Healing: Handouts: Elevated Blood Sugars: How Do They Affect Wound Healing Methods: Explain/Verbal, Printed Responses: Reinforcements needed Electronic Signature(s) Signed: 09/05/2021 12:57:37 PM By: Deon Pilling RN, BSN Entered By: Deon Pilling on 09/05/2021 11:19:23 -------------------------------------------------------------------------------- Wound Assessment Details Patient Name: Date of Service: Kevin Brookes. 09/05/2021 10:30 A M Medical Record Number: [id number] Patient Account Number: [id number] Date of Birth/Sex: Treating RN: [birth year] (66 y.o. [name], Lauren Primary Care Verline Kong: Carolee Rota Other Clinician: Referring Ramondo Dietze: Treating Avarose Mervine/Extender: Maryagnes Amos in Treatment: 26 Wound Status Wound Number: 7 Primary Dehisced Wound Etiology: Wound Location: Left Amputation Site - Transmetatarsal Wound Open Wounding Event: Surgical Injury Status: Date Acquired: 05/20/2021 Comorbid Chronic Obstructive Pulmonary Disease (COPD), Sleep Apnea, Weeks Of Treatment: 13 History: Coronary Artery Disease, Hypertension, Type II Diabetes Clustered Wound: No Photos Wound Measurements Length: (cm) 0.3 Width: (cm) 0.9 Depth: (cm) 1 Area: (cm) 0.212 Volume: (cm) 0.212 % Reduction in Area: 96.9% % Reduction in Volume: 92.2% Epithelialization: Medium (34-66%) Tunneling: Yes Position (o'clock): 3 Maximum Distance: (cm) 0.3 Wound Description Classification: Full Thickness Without Exposed Support Structures Wound Margin: Well defined, not attached Exudate Amount: Medium Exudate Type: Serosanguineous Exudate Color: red, brown Foul Odor After Cleansing: No Slough/Fibrino Yes Wound Bed Granulation Amount: Medium (34-66%) Exposed Structure Granulation Quality: Pink, Pale Fascia Exposed: No Necrotic Amount: Medium (34-66%) Fat Layer (Subcutaneous Tissue) Exposed: Yes Tendon Exposed: No Muscle Exposed: No Joint Exposed: No Bone  Exposed: No Treatment Notes Wound #7 (Amputation Site - Transmetatarsal) Wound Laterality: Left Cleanser Soap and Water Discharge Instruction: May shower and wash wound with dial antibacterial soap and water prior to dressing change. Wound Cleanser Discharge Instruction: Cleanse the wound with wound cleanser prior to applying a clean dressing using gauze sponges, not tissue or cotton balls. Peri-Wound Care Topical Gentamicin Discharge Instruction: As directed by physician ****In office only*** Primary Dressing Hydrofera Blue Classic Foam, 4x4 in Discharge Instruction: Moisten with saline prior to applying to wound bed Secondary Dressing Woven Gauze Sponge, Non-Sterile 4x4 in Discharge Instruction:  Apply over primary dressing as directed. ABD Pad, 5x9 Discharge Instruction: Apply over primary dressing as directed. Secured With Elastic Bandage 4 inch (ACE bandage) Discharge Instruction: Secure with ACE bandage as directed. Kerlix Roll Sterile, 4.5x3.1 (in/yd) Discharge Instruction: Secure with Kerlix as directed. Compression Wrap Compression Stockings Add-Ons Electronic Signature(s) Signed: 09/05/2021 12:52:20 PM By: Rhae Hammock RN Entered By: Rhae Hammock on 09/05/2021 10:50:37 -------------------------------------------------------------------------------- Vitals Details Patient Name: Date of Service: Kevin Brookes. 09/05/2021 10:30 A M Medical Record Number: [id number] Patient Account Number: [id number] Date of Birth/Sex: Treating RN: 03/27/1956 (66 y.o. [name], Lauren Primary Care Taequan Stockhausen: Carolee Rota Other Clinician: Referring Kenyanna Grzesiak: Treating Graycie Halley/Extender: Maryagnes Amos in Treatment: 26 Vital Signs Time Taken: 10:45 Temperature (F): 98.7 Height (in): 69 Pulse (bpm): 90 Weight (lbs): 215 Respiratory Rate (breaths/min): 17 Body Mass Index (BMI): 31.7 Blood Pressure (mmHg): 105/74 Reference Range: 80 - 120 mg  / dl Electronic Signature(s) Signed: 09/05/2021 12:52:20 PM By: Rhae Hammock RN Entered By: Rhae Hammock on 09/05/2021 10:48:10

## 2021-09-09 ENCOUNTER — Encounter: Payer: Self-pay | Admitting: Physician Assistant

## 2021-09-11 DIAGNOSIS — J449 Chronic obstructive pulmonary disease, unspecified: Secondary | ICD-10-CM | POA: Diagnosis not present

## 2021-09-11 DIAGNOSIS — L97422 Non-pressure chronic ulcer of left heel and midfoot with fat layer exposed: Secondary | ICD-10-CM | POA: Diagnosis not present

## 2021-09-11 DIAGNOSIS — Z89432 Acquired absence of left foot: Secondary | ICD-10-CM | POA: Diagnosis not present

## 2021-09-11 DIAGNOSIS — I69354 Hemiplegia and hemiparesis following cerebral infarction affecting left non-dominant side: Secondary | ICD-10-CM | POA: Diagnosis not present

## 2021-09-11 DIAGNOSIS — E785 Hyperlipidemia, unspecified: Secondary | ICD-10-CM | POA: Diagnosis not present

## 2021-09-11 DIAGNOSIS — Z955 Presence of coronary angioplasty implant and graft: Secondary | ICD-10-CM | POA: Diagnosis not present

## 2021-09-11 DIAGNOSIS — I10 Essential (primary) hypertension: Secondary | ICD-10-CM | POA: Diagnosis not present

## 2021-09-11 DIAGNOSIS — F17210 Nicotine dependence, cigarettes, uncomplicated: Secondary | ICD-10-CM | POA: Diagnosis not present

## 2021-09-11 DIAGNOSIS — I2510 Atherosclerotic heart disease of native coronary artery without angina pectoris: Secondary | ICD-10-CM | POA: Diagnosis not present

## 2021-09-11 DIAGNOSIS — Z7984 Long term (current) use of oral hypoglycemic drugs: Secondary | ICD-10-CM | POA: Diagnosis not present

## 2021-09-11 DIAGNOSIS — E1142 Type 2 diabetes mellitus with diabetic polyneuropathy: Secondary | ICD-10-CM | POA: Diagnosis not present

## 2021-09-11 DIAGNOSIS — M1990 Unspecified osteoarthritis, unspecified site: Secondary | ICD-10-CM | POA: Diagnosis not present

## 2021-09-11 DIAGNOSIS — F32A Depression, unspecified: Secondary | ICD-10-CM | POA: Diagnosis not present

## 2021-09-11 DIAGNOSIS — C641 Malignant neoplasm of right kidney, except renal pelvis: Secondary | ICD-10-CM | POA: Diagnosis not present

## 2021-09-11 DIAGNOSIS — G4730 Sleep apnea, unspecified: Secondary | ICD-10-CM | POA: Diagnosis not present

## 2021-09-11 DIAGNOSIS — Z905 Acquired absence of kidney: Secondary | ICD-10-CM | POA: Diagnosis not present

## 2021-09-11 DIAGNOSIS — Z79891 Long term (current) use of opiate analgesic: Secondary | ICD-10-CM | POA: Diagnosis not present

## 2021-09-11 DIAGNOSIS — E1151 Type 2 diabetes mellitus with diabetic peripheral angiopathy without gangrene: Secondary | ICD-10-CM | POA: Diagnosis not present

## 2021-09-11 DIAGNOSIS — Z7902 Long term (current) use of antithrombotics/antiplatelets: Secondary | ICD-10-CM | POA: Diagnosis not present

## 2021-09-11 DIAGNOSIS — Z794 Long term (current) use of insulin: Secondary | ICD-10-CM | POA: Diagnosis not present

## 2021-09-11 DIAGNOSIS — K219 Gastro-esophageal reflux disease without esophagitis: Secondary | ICD-10-CM | POA: Diagnosis not present

## 2021-09-11 DIAGNOSIS — E11621 Type 2 diabetes mellitus with foot ulcer: Secondary | ICD-10-CM | POA: Diagnosis not present

## 2021-09-11 DIAGNOSIS — Z9981 Dependence on supplemental oxygen: Secondary | ICD-10-CM | POA: Diagnosis not present

## 2021-09-12 ENCOUNTER — Other Ambulatory Visit: Payer: Self-pay

## 2021-09-12 DIAGNOSIS — I739 Peripheral vascular disease, unspecified: Secondary | ICD-10-CM

## 2021-09-12 DIAGNOSIS — I6529 Occlusion and stenosis of unspecified carotid artery: Secondary | ICD-10-CM

## 2021-09-15 DIAGNOSIS — H938X2 Other specified disorders of left ear: Secondary | ICD-10-CM | POA: Diagnosis not present

## 2021-09-19 ENCOUNTER — Other Ambulatory Visit: Payer: Self-pay

## 2021-09-19 ENCOUNTER — Encounter (HOSPITAL_BASED_OUTPATIENT_CLINIC_OR_DEPARTMENT_OTHER): Payer: Medicare Other | Attending: Internal Medicine | Admitting: Internal Medicine

## 2021-09-19 DIAGNOSIS — Y835 Amputation of limb(s) as the cause of abnormal reaction of the patient, or of later complication, without mention of misadventure at the time of the procedure: Secondary | ICD-10-CM | POA: Diagnosis not present

## 2021-09-19 DIAGNOSIS — J449 Chronic obstructive pulmonary disease, unspecified: Secondary | ICD-10-CM | POA: Insufficient documentation

## 2021-09-19 DIAGNOSIS — E11621 Type 2 diabetes mellitus with foot ulcer: Secondary | ICD-10-CM | POA: Diagnosis not present

## 2021-09-19 DIAGNOSIS — Z9981 Dependence on supplemental oxygen: Secondary | ICD-10-CM | POA: Diagnosis not present

## 2021-09-19 DIAGNOSIS — S91109A Unspecified open wound of unspecified toe(s) without damage to nail, initial encounter: Secondary | ICD-10-CM | POA: Diagnosis not present

## 2021-09-19 DIAGNOSIS — T8781 Dehiscence of amputation stump: Secondary | ICD-10-CM | POA: Diagnosis not present

## 2021-09-19 DIAGNOSIS — L97528 Non-pressure chronic ulcer of other part of left foot with other specified severity: Secondary | ICD-10-CM | POA: Insufficient documentation

## 2021-09-19 NOTE — Progress Notes (Signed)
[name] ([id number]) , Visit Report for 09/19/2021 Chief Complaint Document Details Patient Name: Date of Service: Kevin, [name] 09/19/2021 10:30 A M Medical Record Number: [id number] Patient Account Number: [id number] Date of Birth/Sex: Treating RN: March 22, 1956 (66 y.o. M) Primary Care Provider: Carolee Rota Other Clinician: Referring Provider: Treating Provider/Extender: Maryagnes Amos in Treatment: 28 Information Obtained from: Patient Chief Complaint Nonhealing wound secondary to Wound dehiscence status post transmetatarsal amputation on the left Electronic Signature(s) Signed: 09/19/2021 11:12:28 AM By: Kalman Shan DO Entered By: Kalman Shan on 09/19/2021 11:07:54 -------------------------------------------------------------------------------- Debridement Details Patient Name: Date of Service: [name]. 09/19/2021 10:30 A M Medical Record Number: [id number] Patient Account Number: [id number] Date of Birth/Sex: Treating RN: April 04, 1956 (66 y.o. [name], [name] Primary Care Provider: Carolee Rota Other Clinician: Referring Provider: Treating Provider/Extender: Maryagnes Amos in Treatment: 28 Debridement Performed for Assessment: Wound #7 Left Amputation Site - Transmetatarsal Performed By: Physician Kalman Shan, DO Debridement Type: Debridement Level of Consciousness (Pre-procedure): Awake and Alert Pre-procedure Verification/Time Out Yes - 10:40 Taken: Start Time: 10:41 Pain Control: Other : benzocaine 20% T Area Debrided (L x W): otal 1 (cm) x 2 (cm) = 2 (cm) Tissue and other material debrided: Non-Viable, Callus, Subcutaneous Level: Skin/Subcutaneous Tissue Debridement Description: Excisional Instrument: Curette Bleeding: Minimum Hemostasis Achieved: Pressure End Time: 10:43 Procedural Pain: 0 Post Procedural Pain: 0 Response to Treatment: Procedure was tolerated well Level of  Consciousness (Post- Awake and Alert procedure): Post Debridement Measurements of Total Wound Length: (cm) 0.2 Width: (cm) 0.2 Depth: (cm) 0.3 Volume: (cm) 0.009 Character of Wound/Ulcer Post Debridement: Improved Post Procedure Diagnosis Same as Pre-procedure Electronic Signature(s) Signed: 09/19/2021 11:12:28 AM By: Kalman Shan DO Signed: 09/19/2021 12:46:23 PM By: Deon Pilling RN, BSN Entered By: Deon Pilling on 09/19/2021 10:43:57 -------------------------------------------------------------------------------- HPI Details Patient Name: Date of Service: [name]. 09/19/2021 10:30 A M Medical Record Number: [id number] Patient Account Number: [id number] Date of Birth/Sex: Treating RN: 08-11-1955 (66 y.o. M) Primary Care Provider: Carolee Rota Other Clinician: Referring Provider: Treating Provider/Extender: Maryagnes Amos in Treatment: 28 History of Present Illness HPI Description: Admission 8/18 [name] is a 66 year old male with a past medical history of uncontrolled insulin-dependent type 2 diabetes, COPD On O2 6 L via nasal cannula That presents to the clinic for a 61-month history of nonhealing wound to the left great toe amputation site. He has been following with podiatry for a wound to his left great toe that developed osteomyelitis and subsequently required amputation. He had the surgery done on 6/10. He had a partial first ray amputation by Dr. Posey Pronto. Since his surgery he has been placing iodine on the wound bed. He states that there has been no improvement to the wound healing in the past 2 months. He states he has occasional intermittent pain to the area. He denies systemic signs of infection. 8/25; patient presents for 1 week follow-up. He states he has been using silver alginate daily without issues. He has no complaints today. He denies signs of infection. He is scheduled to have his MRI on 8/27. 9/15; patient presents for  follow-up. He missed his last clinic appointment due to feeling ill. He continues to use silver alginate daily without any issues. He reports improvement in wound healing. He currently denies signs of infection. 9/27; patient presents for follow-up. He has developed a new wound to the second left toe. He is unaware of this and was noted on exam today.  He continues to use silver alginate to the amputation site wound. He has no issues or complaints today. He currently denies signs of infection. 10/4; patient presents for follow-up. He is using silver alginate to the wound sites. He has no issues or complaints today. He denies systemic signs of infection. 10/10; patient presents for follow-up. He has been using silver alginate to the wound sites. He states that his foot became more swollen over the weekend, But has since improved. He overall feels okay. Admission 06/02/2021 Patient presents for nonhealing wound secondary to wound dehiscence following a transmetatarsal amputation. He states he has been using Betadine wet-to- dry dressings to the wound bed. He states the wound has been open for the past 3 weeks. He currently denies Pain or signs of infection. 11/21; patient presents for follow-up. He has home health but states this will end in 2 weeks per insurance. He has no issues or complaints today. He denies signs of infection. 11/28; patient presents for follow-up. He states that home health will be stopped at the end of the week. He has no issues or complaints today. 12/12; patient presents for follow-up. He states he saw Dr. Posey Pronto on 11/30 and was prescribed doxycycline for cellulitis. He currently reports more erythema and pain over the past week. He states his sugars have been very high and in the 400s. His family encouraged him to go to the ED over the weekend however he declined. 1/20; patient presents for follow-up. He reports having an arteriogram with stenting placed to the left lower  extremity. He has been using Hydrofera Blue to the wound bed with improvement in wound healing. He denies signs of infection. 2/3; patient presents for follow-up. He has been using Hydrofera Blue to the wound beds without issues. He reports improvement in wound healing. He denies signs of infection. 2/17; patient presents for follow-up. He has been using Hydrofera Blue to the wound beds without issues. He reports continued improvement in wound healing. He denies signs of infection. 3/3; patient presents for follow-up. He has been using Hydrofera Blue to the wound beds. Home health has discharged the patient. He currently denies signs of infection. Electronic Signature(s) Signed: 09/19/2021 11:12:28 AM By: Kalman Shan DO Entered By: Kalman Shan on 09/19/2021 11:08:22 -------------------------------------------------------------------------------- Physical Exam Details Patient Name: Date of Service: [name]. 09/19/2021 10:30 A M Medical Record Number: [id number] Patient Account Number: [id number] Date of Birth/Sex: Treating RN: 1956/03/24 (66 y.o. M) Primary Care Provider: Carolee Rota Other Clinician: Referring Provider: Treating Provider/Extender: Maryagnes Amos in Treatment: 28 Constitutional respirations regular, non-labored and within target range for patient.. Cardiovascular 2+ dorsalis pedis/posterior tibialis pulses. Psychiatric pleasant and cooperative. Notes Left foot: Transmetatarsal amputation. Significant callus and post debridement there is an open wound with granulation tissue present. Minimal depth to the wound bed. No drainage noted. Appears well-healing. Electronic Signature(s) Signed: 09/19/2021 11:12:28 AM By: Kalman Shan DO Entered By: Kalman Shan on 09/19/2021 11:09:02 -------------------------------------------------------------------------------- Physician Orders Details Patient Name: Date of Service: [name]. 09/19/2021 10:30 A M Medical Record Number: [id number] Patient Account Number: [id number] Date of Birth/Sex: Treating RN: 07-Nov-1955 (66 y.o. [name] Primary Care Provider: Carolee Rota Other Clinician: Referring Provider: Treating Provider/Extender: Maryagnes Amos in Treatment: 28 Verbal / Phone Orders: No Diagnosis Coding ICD-10 Coding Code Description L97.528 Non-pressure chronic ulcer of other part of left foot with other specified severity T81.31XD Disruption of external operation (surgical) wound, not elsewhere classified, subsequent encounter E11.621  Type 2 diabetes mellitus with foot ulcer J44.9 Chronic obstructive pulmonary disease, unspecified Follow-up Appointments ppointment in 2 weeks. - with Dr. Heber [place] Room 8 Return A -Monitor/Control Blood Sugar -Stop/Decrease Smoking Other: - Pick up gentamicin ointment at your pharmacy. Bathing/ Shower/ Hygiene May shower with protection but do not get wound dressing(s) wet. Edema Control - Lymphedema / SCD / Other Elevate legs to the level of the heart or above for 30 minutes daily and/or when sitting, a frequency of: - throughout the day Avoid standing for long periods of time. Moisturize legs daily. - both legs and feet every night before bed. Off-Loading DH Walker Boot to: - left foot per surgeon Additional Orders / Instructions Follow Nutritious Diet - Monitor and keep blood sugars well controlled Wound Treatment Wound #7 - Amputation Site - Transmetatarsal Wound Laterality: Left Cleanser: Soap and Water 1 x Per Day/30 Days Discharge Instructions: May shower and wash wound with dial antibacterial soap and water prior to dressing change. Cleanser: Wound Cleanser 1 x Per Day/30 Days Discharge Instructions: Cleanse the wound with wound cleanser prior to applying a clean dressing using gauze sponges, not tissue or cotton balls. Peri-Wound Care: Skin Prep (DME) (Generic) 1 x Per Day/30  Days Discharge Instructions: Use skin prep as directed Topical: Gentamicin 1 x Per Day/30 Days Discharge Instructions: Apply directly into wound bed. May use a Q-tip to gently apply a thin layer. Secondary Dressing: Woven Gauze Sponges 2x2 in 1 x Per Day/30 Days Discharge Instructions: Apply over primary dressing as directed. Secondary Dressing: Zetuvit Plus Silicone Border Dressing 4x4 (in/in) (DME) (Generic) 1 x Per Day/30 Days Discharge Instructions: Apply silicone border over primary dressing as directed. Patient Medications llergies: oxycodone, lisinopril A Notifications Medication Indication Start End 09/19/2021 gentamicin DOSE 1 - topical 0.1 % cream - Apply daily to the wound bed Electronic Signature(s) Signed: 09/19/2021 11:12:28 AM By: Kalman Shan DO Previous Signature: 09/19/2021 11:09:55 AM Version By: Kalman Shan DO Entered By: Kalman Shan on 09/19/2021 11:10:11 -------------------------------------------------------------------------------- Problem List Details Patient Name: Date of Service: [name]. 09/19/2021 10:30 A M Medical Record Number: [id number] Patient Account Number: [id number] Date of Birth/Sex: Treating RN: [birth year] (66 y.o. [name], [name] Primary Care Provider: Carolee Rota Other Clinician: Referring Provider: Treating Provider/Extender: Maryagnes Amos in Treatment: 28 Active Problems ICD-10 Encounter Code Description Active Date MDM Diagnosis L97.528 Non-pressure chronic ulcer of other part of left foot with other specified 06/02/2021 No Yes severity T81.31XD Disruption of external operation (surgical) wound, not elsewhere classified, 06/02/2021 No Yes subsequent encounter E11.621 Type 2 diabetes mellitus with foot ulcer 03/06/2021 No Yes J44.9 Chronic obstructive pulmonary disease, unspecified 03/06/2021 No Yes Inactive Problems ICD-10 Code Description Active Date Inactive Date S91.302A Unspecified  open wound, left foot, initial encounter 03/06/2021 03/06/2021 Resolved Problems Electronic Signature(s) Signed: 09/19/2021 11:12:28 AM By: Kalman Shan DO Entered By: Kalman Shan on 09/19/2021 11:07:40 -------------------------------------------------------------------------------- Progress Note Details Patient Name: Date of Service: [name]. 09/19/2021 10:30 A M Medical Record Number: [id number] Patient Account Number: [id number] Date of Birth/Sex: Treating RN: 1955/08/15 (65 y.o. M) Primary Care Provider: Carolee Rota Other Clinician: Referring Provider: Treating Provider/Extender: Maryagnes Amos in Treatment: 28 Subjective Chief Complaint Information obtained from Patient Nonhealing wound secondary to Wound dehiscence status post transmetatarsal amputation on the left History of Present Illness (HPI) Admission 8/18 [name] is a 66 year old male with a past medical history of uncontrolled insulin-dependent type 2 diabetes, COPD On O2 6 L via  nasal cannula That presents to the clinic for a 67-month history of nonhealing wound to the left great toe amputation site. He has been following with podiatry for a wound to his left great toe that developed osteomyelitis and subsequently required amputation. He had the surgery done on 6/10. He had a partial first ray amputation by Dr. Posey Pronto. Since his surgery he has been placing iodine on the wound bed. He states that there has been no improvement to the wound healing in the past 2 months. He states he has occasional intermittent pain to the area. He denies systemic signs of infection. 8/25; patient presents for 1 week follow-up. He states he has been using silver alginate daily without issues. He has no complaints today. He denies signs of infection. He is scheduled to have his MRI on 8/27. 9/15; patient presents for follow-up. He missed his last clinic appointment due to feeling ill. He continues to  use silver alginate daily without any issues. He reports improvement in wound healing. He currently denies signs of infection. 9/27; patient presents for follow-up. He has developed a new wound to the second left toe. He is unaware of this and was noted on exam today. He continues to use silver alginate to the amputation site wound. He has no issues or complaints today. He currently denies signs of infection. 10/4; patient presents for follow-up. He is using silver alginate to the wound sites. He has no issues or complaints today. He denies systemic signs of infection. 10/10; patient presents for follow-up. He has been using silver alginate to the wound sites. He states that his foot became more swollen over the weekend, But has since improved. He overall feels okay. Admission 06/02/2021 Patient presents for nonhealing wound secondary to wound dehiscence following a transmetatarsal amputation. He states he has been using Betadine wet-to- dry dressings to the wound bed. He states the wound has been open for the past 3 weeks. He currently denies Pain or signs of infection. 11/21; patient presents for follow-up. He has home health but states this will end in 2 weeks per insurance. He has no issues or complaints today. He denies signs of infection. 11/28; patient presents for follow-up. He states that home health will be stopped at the end of the week. He has no issues or complaints today. 12/12; patient presents for follow-up. He states he saw Dr. Posey Pronto on 11/30 and was prescribed doxycycline for cellulitis. He currently reports more erythema and pain over the past week. He states his sugars have been very high and in the 400s. His family encouraged him to go to the ED over the weekend however he declined. 1/20; patient presents for follow-up. He reports having an arteriogram with stenting placed to the left lower extremity. He has been using Hydrofera Blue to the wound bed with improvement in wound  healing. He denies signs of infection. 2/3; patient presents for follow-up. He has been using Hydrofera Blue to the wound beds without issues. He reports improvement in wound healing. He denies signs of infection. 2/17; patient presents for follow-up. He has been using Hydrofera Blue to the wound beds without issues. He reports continued improvement in wound healing. He denies signs of infection. 3/3; patient presents for follow-up. He has been using Hydrofera Blue to the wound beds. Home health has discharged the patient. He currently denies signs of infection. Patient History Information obtained from Patient. Family History Cancer - Maternal Grandparents,Paternal Grandparents,Father,Mother, Diabetes - Mother,Father, Heart Disease - Maternal Grandparents,Mother,Father, Kidney Disease -  Maternal Grandparents, Lung Disease - Father, Stroke - Paternal Grandparents,Maternal Grandparents,Mother, No family history of Hereditary Spherocytosis, Hypertension, Seizures, Thyroid Problems, Tuberculosis. Social History Current every day smoker - 1/2 pack a day, Alcohol Use - Never, Drug Use - No History, Caffeine Use - Daily - coffee, tea. Medical History Respiratory Patient has history of Chronic Obstructive Pulmonary Disease (COPD), Sleep Apnea - does not use CPAP Cardiovascular Patient has history of Coronary Artery Disease - mx vessel, Hypertension Gastrointestinal Denies history of Cirrhosis , Colitis, Crohnoos, Hepatitis A, Hepatitis B, Hepatitis C Endocrine Patient has history of Type II Diabetes - last A1c - 13.3 Hospitalization/Surgery History - appendectomy. - cholecystectomy. - laproscopic nephrectomy. - lapro lysis of lesions. - thorascopy. - lung biopsy. - bronchoscopy. - (L) heart cath with angio X3. - endarcdectomy. - patch angioplasty. - left great toe amp. December 27, 2020. Medical A Surgical History Notes nd Constitutional Symptoms (General Health) noncompliance , cocaine abuse  ,hepatitis ,headaches Respiratory h/o pneumonia , end stage chronic lung disease , COPD/ILD Cardiovascular h/o CVA , hyperlipidemia , coronary atherosclerosis , acute on chronic diastolic heart failure LVEF 60%-65% Gastrointestinal cholecystitis , GERD Genitourinary h/o nephrolithiasis , h/o renal cell carcinoma , CKD Stage 3 Musculoskeletal arthritis Psychiatric anxiety , depression Objective Constitutional respirations regular, non-labored and within target range for patient.. Vitals Time Taken: 10:28 AM, Height: 69 in, Weight: 215 lbs, BMI: 31.7, Temperature: 98.2 F, Pulse: 112 bpm, Respiratory Rate: 20 breaths/min, Blood Pressure: 99/67 mmHg, Capillary Blood Glucose: 167 mg/dl. Cardiovascular 2+ dorsalis pedis/posterior tibialis pulses. Psychiatric pleasant and cooperative. General Notes: Left foot: Transmetatarsal amputation. Significant callus and post debridement there is an open wound with granulation tissue present. Minimal depth to the wound bed. No drainage noted. Appears well-healing. Integumentary (Hair, Skin) Wound #7 status is Open. Original cause of wound was Surgical Injury. The date acquired was: 05/20/2021. The wound has been in treatment 15 weeks. The wound is located on the Left Amputation Site - Transmetatarsal. The wound measures 0.1cm length x 0.1cm width x 0.3cm depth; 0.008cm^2 area and 0.002cm^3 volume. There is no tunneling or undermining noted. There is a none present amount of drainage noted. The wound margin is well defined and not attached to the wound base. There is no granulation within the wound bed. There is a medium (34-66%) amount of necrotic tissue within the wound bed. General Notes: callous to periwound. Assessment Active Problems ICD-10 Non-pressure chronic ulcer of other part of left foot with other specified severity Disruption of external operation (surgical) wound, not elsewhere classified, subsequent encounter Type 2 diabetes mellitus  with foot ulcer Chronic obstructive pulmonary disease, unspecified Patient's wound has shown improvement in size and appearance since last clinic visit. I debrided nonviable tissue. He still has a small open wound present. Home health has been doing dressing changes however they have discharged the patient. Patient has a tough time doing dressing changes on his own. He did state however he could apply ointment to the wound bed and keep the area covered with a Band-Aid. At this time I recommended gentamicin ointment daily. Follow-up in 2 weeks. Procedures Wound #7 Pre-procedure diagnosis of Wound #7 is a Dehisced Wound located on the Left Amputation Site - Transmetatarsal . There was a Excisional Skin/Subcutaneous Tissue Debridement with a total area of 2 sq cm performed by Kalman Shan, DO. With the following instrument(s): Curette to remove Non-Viable tissue/material. Material removed includes Callus and Subcutaneous Tissue and after achieving pain control using Other (benzocaine 20%). A time out  was conducted at 10:40, prior to the start of the procedure. A Minimum amount of bleeding was controlled with Pressure. The procedure was tolerated well with a pain level of 0 throughout and a pain level of 0 following the procedure. Post Debridement Measurements: 0.2cm length x 0.2cm width x 0.3cm depth; 0.009cm^3 volume. Character of Wound/Ulcer Post Debridement is improved. Post procedure Diagnosis Wound #7: Same as Pre-Procedure Plan Follow-up Appointments: Return Appointment in 2 weeks. - with Dr. Heber [place] Room 8 -Monitor/Control Blood Sugar -Stop/Decrease Smoking Other: - Pick up gentamicin ointment at your pharmacy. Bathing/ Shower/ Hygiene: May shower with protection but do not get wound dressing(s) wet. Edema Control - Lymphedema / SCD / Other: Elevate legs to the level of the heart or above for 30 minutes daily and/or when sitting, a frequency of: - throughout the day Avoid standing for  long periods of time. Moisturize legs daily. - both legs and feet every night before bed. Off-Loading: DH Walker Boot to: - left foot per surgeon Additional Orders / Instructions: Follow Nutritious Diet - Monitor and keep blood sugars well controlled The following medication(s) was prescribed: gentamicin topical 0.1 % cream 1 Apply daily to the wound bed starting 09/19/2021 WOUND #7: - Amputation Site - Transmetatarsal Wound Laterality: Left Cleanser: Soap and Water 1 x Per Day/30 Days Discharge Instructions: May shower and wash wound with dial antibacterial soap and water prior to dressing change. Cleanser: Wound Cleanser 1 x Per Day/30 Days Discharge Instructions: Cleanse the wound with wound cleanser prior to applying a clean dressing using gauze sponges, not tissue or cotton balls. Peri-Wound Care: Skin Prep (DME) (Generic) 1 x Per Day/30 Days Discharge Instructions: Use skin prep as directed Topical: Gentamicin 1 x Per Day/30 Days Discharge Instructions: Apply directly into wound bed. May use a Q-tip to gently apply a thin layer. Secondary Dressing: Woven Gauze Sponges 2x2 in 1 x Per Day/30 Days Discharge Instructions: Apply over primary dressing as directed. Secondary Dressing: Zetuvit Plus Silicone Border Dressing 4x4 (in/in) (DME) (Generic) 1 x Per Day/30 Days Discharge Instructions: Apply silicone border over primary dressing as directed. 1. Gentamicin ointment daily 2. Follow-up in 2 weeks Electronic Signature(s) Signed: 09/19/2021 11:12:28 AM By: Kalman Shan DO Entered By: Kalman Shan on 09/19/2021 11:11:53 -------------------------------------------------------------------------------- HxROS Details Patient Name: Date of Service: [name]. 09/19/2021 10:30 A M Medical Record Number: [id number] Patient Account Number: [id number] Date of Birth/Sex: Treating RN: 01-16-1956 (66 y.o. M) Primary Care Provider: Carolee Rota Other Clinician: Referring  Provider: Treating Provider/Extender: Maryagnes Amos in Treatment: 28 Information Obtained From Patient Constitutional Symptoms (General Health) Medical History: Past Medical History Notes: noncompliance , cocaine abuse ,hepatitis ,headaches Respiratory Medical History: Positive for: Chronic Obstructive Pulmonary Disease (COPD); Sleep Apnea - does not use CPAP Past Medical History Notes: h/o pneumonia , end stage chronic lung disease , COPD/ILD Cardiovascular Medical History: Positive for: Coronary Artery Disease - mx vessel; Hypertension Past Medical History Notes: h/o CVA , hyperlipidemia , coronary atherosclerosis , acute on chronic diastolic heart failure LVEF 60%-65% Gastrointestinal Medical History: Negative for: Cirrhosis ; Colitis; Crohns; Hepatitis A; Hepatitis B; Hepatitis C Past Medical History Notes: cholecystitis , GERD Endocrine Medical History: Positive for: Type II Diabetes - last A1c - 13.3 Genitourinary Medical History: Past Medical History Notes: h/o nephrolithiasis , h/o renal cell carcinoma , CKD Stage 3 Musculoskeletal Medical History: Past Medical History Notes: arthritis Psychiatric Medical History: Past Medical History Notes: anxiety , depression Immunizations Pneumococcal Vaccine: Received Pneumococcal Vaccination: Yes  Received Pneumococcal Vaccination On or After 60th Birthday: Yes Implantable Devices None Hospitalization / Surgery History Type of Hospitalization/Surgery appendectomy cholecystectomy laproscopic nephrectomy lapro lysis of lesions thorascopy lung biopsy bronchoscopy (L) heart cath with angio X3 endarcdectomy patch angioplasty left great toe amp. December 27, 2020 Family and Social History Cancer: Yes - Maternal Grandparents,Paternal Grandparents,Father,Mother; Diabetes: Yes - Mother,Father; Heart Disease: Yes - Maternal Grandparents,Mother,Father; Hereditary Spherocytosis: No; Hypertension: No;  Kidney Disease: Yes - Maternal Grandparents; Lung Disease: Yes - Father; Seizures: No; Stroke: Yes - Paternal Grandparents,Maternal Grandparents,Mother; Thyroid Problems: No; Tuberculosis: No; Current every day smoker - 1/2 pack a day; Alcohol Use: Never; Drug Use: No History; Caffeine Use: Daily - coffee, tea; Financial Concerns: No; Food, Clothing or Shelter Needs: No; Support System Lacking: No; Transportation Concerns: No Electronic Signature(s) Signed: 09/19/2021 11:12:28 AM By: Kalman Shan DO Entered By: Kalman Shan on 09/19/2021 11:08:27 -------------------------------------------------------------------------------- SuperBill Details Patient Name: Date of Service: [name] 09/19/2021 Medical Record Number: [id number] Patient Account Number: [id number] Date of Birth/Sex: Treating RN: 08-16-1955 (66 y.o. [name] Primary Care Provider: Carolee Rota Other Clinician: Referring Provider: Treating Provider/Extender: Maryagnes Amos in Treatment: 28 Diagnosis Coding ICD-10 Codes Code Description [phone] Non-pressure chronic ulcer of other part of left foot with other specified severity T81.31XD Disruption of external operation (surgical) wound, not elsewhere classified, subsequent encounter E11.621 Type 2 diabetes mellitus with foot ulcer J44.9 Chronic obstructive pulmonary disease, unspecified Facility Procedures CPT4 Code: [id number] Description: 95747 - DEB SUBQ TISSUE 20 SQ CM/< ICD-10 Diagnosis Description L97.528 Non-pressure chronic ulcer of other part of left foot with other specified seve Modifier: rity Quantity: 1 Physician Procedures : CPT4 Code Description Modifier [id number] - WC PHYS SUBQ TISS 20 SQ CM ICD-10 Diagnosis Description L97.528 Non-pressure chronic ulcer of other part of left foot with other specified severity Quantity: 1 Electronic Signature(s) Signed: 09/19/2021 11:12:28 AM By: Kalman Shan DO Entered  By: Kalman Shan on 09/19/2021 11:12:05

## 2021-09-19 NOTE — Progress Notes (Signed)
[name] ([id number]) , Visit Report for 09/19/2021 Arrival Information Details Patient Name: Date of Service: [name], [name] 09/19/2021 10:30 A M Medical Record Number: [id number] Patient Account Number: [id number] Date of Birth/Sex: Treating RN: Apr 19, 1956 (66 y.o. [name], [name] Primary Care [name]: [name] Other Clinician: Referring [name]: Treating [name]/Extender: [name] in Treatment: 28 Visit Information History Since Last Visit Added or deleted any medications: No Patient Arrived: [name] Any new allergies or adverse reactions: No Arrival Time: 10:29 Had a fall or experienced change in No Accompanied By: self activities of daily living that may affect Transfer Assistance: None risk of falls: Patient Identification Verified: Yes Signs or symptoms of abuse/neglect since last No Secondary Verification Process Completed: Yes visito Patient Requires Transmission-Based Precautions: No Hospitalized since last visit: No Patient Has Alerts: Yes Implantable device outside of the clinic No Patient Alerts: ABI's: R: 0.82 L:0.76 excluding cellular tissue based products placed in the center since last visit: Has Dressing in Place as Prescribed: Yes Has Footwear/Offloading in Place as Yes Prescribed: Left: Removable Cast Walker/Walking Boot Pain Present Now: No Electronic Signature(s) Signed: 09/19/2021 12:46:23 PM By: Kevin Pilling RN, BSN Entered By: [name] on 09/19/2021 10:29:54 -------------------------------------------------------------------------------- Encounter Discharge Information Details Patient Name: Date of Service: [name]. 09/19/2021 10:30 A M Medical Record Number: [id number] Patient Account Number: [id number] Date of Birth/Sex: Treating RN: 1955/09/13 (65 y.o. [name] Primary Care [name]: [name] Other Clinician: Referring Annalisa [name]: Treating [name]/Extender: [name] in Treatment: 28 Encounter Discharge Information Items Post Procedure Vitals Discharge Condition: Stable Temperature (F): 98.2 Ambulatory Status: Cane Pulse (bpm): 112 Discharge Destination: Home Respiratory Rate (breaths/min): 20 Transportation: Private Auto Blood Pressure (mmHg): 99/67 Accompanied By: family member Schedule Follow-up Appointment: Yes Clinical Summary of Care: Electronic Signature(s) Signed: 09/19/2021 12:46:23 PM By: Kevin Pilling RN, BSN Entered By: [name] on 09/19/2021 10:48:57 -------------------------------------------------------------------------------- Lower Extremity Assessment Details Patient Name: Date of Service: [name]. 09/19/2021 10:30 A M Medical Record Number: [id number] Patient Account Number: [id number] Date of Birth/Sex: Treating RN: 1955/09/21 (65 y.o. [name] Primary Care Rheda Kassab: [name] Other Clinician: Referring Kaysin Brock: Treating [name]/Extender: [name] in Treatment: 28 Edema Assessment Assessed: [name]: Yes] [name]: No] Edema: [Left: N] [Right: o] Calf Left: Right: Point of Measurement: 37 cm From Medial Instep 36.5 cm Ankle Left: Right: Point of Measurement: 8 cm From Medial Instep 28.4 cm Vascular Assessment Pulses: Dorsalis Pedis Palpable: [Left:Yes] Electronic Signature(s) Signed: 09/19/2021 12:46:23 PM By: Kevin Pilling RN, BSN Entered By: [name] on 09/19/2021 10:32:20 -------------------------------------------------------------------------------- Multi Wound Chart Details Patient Name: Date of Service: [name]. 09/19/2021 10:30 A M Medical Record Number: [id number] Patient Account Number: [id number] Date of Birth/Sex: Treating RN: 10-Apr-1956 (65 y.o. M) Primary Care Niya Behler: [name] Other Clinician: Referring [name]: Treating [name]/Extender: [name] in Treatment:  28 Vital Signs Height(in): 69 Capillary Blood Glucose(mg/dl): 167 Weight(lbs): 215 Pulse(bpm): 112 Body Mass Index(BMI): 31.7 Blood Pressure(mmHg): 99/67 Temperature(F): 98.2 Respiratory Rate(breaths/min): 20 Photos: [7:Left Amputation Site - Transmetatarsal N/A] [N/A:N/A] Wound Location: [7:Surgical Injury] [N/A:N/A] Wounding Event: [7:Dehisced Wound] [N/A:N/A] Primary Etiology: [7:Chronic Obstructive Pulmonary] [N/A:N/A] Comorbid History: [7:Disease (COPD), Sleep Apnea, Coronary Artery Disease, Hypertension, Type II Diabetes 05/20/2021] [N/A:N/A] Date Acquired: [7:15] [N/A:N/A] Weeks of Treatment: [7:Open] [N/A:N/A] Wound Status: [7:No] [N/A:N/A] Wound Recurrence: [7:0.1x0.1x0.3] [N/A:N/A] Measurements L x W x D (cm) [7:0.008] [N/A:N/A] A (cm) : rea [7:0.002] [N/A:N/A] Volume (cm) : [7:99.90%] [N/A:N/A] %  Reduction in A [7:rea: 99.90%] [N/A:N/A] % Reduction in Volume: [7:Full Thickness Without Exposed] [N/A:N/A] Classification: [7:Support Structures None Present] [N/A:N/A] Exudate A mount: [7:Well defined, not attached] [N/A:N/A] Wound Margin: [7:None Present (0%)] [N/A:N/A] Granulation A mount: [7:Medium (34-66%)] [N/A:N/A] Necrotic A mount: [7:Fascia: No] [N/A:N/A] Exposed Structures: [7:Fat Layer (Subcutaneous Tissue): No Tendon: No Muscle: No Joint: No Bone: No Large (67-100%)] [N/A:N/A] Epithelialization: [7:Debridement - Excisional] [N/A:N/A] Debridement: Pre-procedure Verification/Time Out 10:40 [N/A:N/A] Taken: [7:Other] [N/A:N/A] Pain Control: [7:Callus, Subcutaneous] [N/A:N/A] Tissue Debrided: [7:Skin/Subcutaneous Tissue] [N/A:N/A] Level: [7:2] [N/A:N/A] Debridement A (sq cm): [7:rea Curette] [N/A:N/A] Instrument: [7:Minimum] [N/A:N/A] Bleeding: [7:Pressure] [N/A:N/A] Hemostasis A chieved: [7:0] [N/A:N/A] Procedural Pain: [7:0] [N/A:N/A] Post Procedural Pain: [7:Procedure was tolerated well] [N/A:N/A] Debridement Treatment Response: [7:0.2x0.2x0.3]  [N/A:N/A] Post Debridement Measurements L x W x D (cm) [7:0.009] [N/A:N/A] Post Debridement Volume: (cm) [7:callous to periwound.] [N/A:N/A] Assessment Notes: [7:Debridement] [N/A:N/A] Treatment Notes Wound #7 (Amputation Site - Transmetatarsal) Wound Laterality: Left Cleanser Soap and Water Discharge Instruction: May shower and wash wound with dial antibacterial soap and water prior to dressing change. Wound Cleanser Discharge Instruction: Cleanse the wound with wound cleanser prior to applying a clean dressing using gauze sponges, not tissue or cotton balls. Peri-Wound Care Skin Prep Discharge Instruction: Use skin prep as directed Topical Gentamicin Discharge Instruction: Apply directly into wound bed. May use a Q-tip to gently apply a thin layer. Primary Dressing Secondary Dressing Woven Gauze Sponges 2x2 in Discharge Instruction: Apply over primary dressing as directed. Zetuvit Plus Silicone Border Dressing 4x4 (in/in) Discharge Instruction: Apply silicone border over primary dressing as directed. Secured With Compression Wrap Compression Stockings Add-Ons Electronic Signature(s) Signed: 09/19/2021 11:12:28 AM By: Kalman Shan DO Entered By: Kalman Shan on 09/19/2021 11:07:46 -------------------------------------------------------------------------------- Multi-Disciplinary Care Plan Details Patient Name: Date of Service: [name]. 09/19/2021 10:30 A M Medical Record Number: [id number] Patient Account Number: [id number] Date of Birth/Sex: Treating RN: [birth year] (66 y.o. [name], [name] Primary Care Cartrell Bentsen: [name] Other Clinician: Referring Eugenie Harewood: Treating Sriya Kroeze/Extender: [name] in Treatment: 28 Multidisciplinary Care Plan reviewed with physician Active Inactive Nutrition Nursing Diagnoses: Impaired glucose control: actual or potential Goals: Patient/caregiver verbalizes understanding of need to  maintain therapeutic glucose control per primary care physician Date Initiated: 06/30/2021 Target Resolution Date: 10/17/2021 Goal Status: Active Patient/caregiver will maintain therapeutic glucose control Date Initiated: 06/30/2021 Target Resolution Date: 10/17/2021 Goal Status: Active Interventions: Assess HgA1c results as ordered upon admission and as needed Provide education on elevated blood sugars and impact on wound healing Provide education on nutrition Treatment Activities: Education provided on Nutrition : 06/30/2021 Obtain HgA1c : 06/30/2021 Patient referred to Primary Care Physician for further nutritional evaluation : 06/30/2021 Notes: Wound/Skin Impairment Nursing Diagnoses: Impaired tissue integrity Knowledge deficit related to ulceration/compromised skin integrity Goals: Patient will have a decrease in wound volume by X% from date: (specify in notes) Date Initiated: 03/06/2021 Date Inactivated: 04/15/2021 Target Resolution Date: 03/26/2021 Goal Status: Met Patient/caregiver will verbalize understanding of skin care regimen Date Initiated: 03/06/2021 Target Resolution Date: 10/17/2021 Goal Status: Active Ulcer/skin breakdown will have a volume reduction of 30% by week 4 Date Initiated: 03/06/2021 Date Inactivated: 04/15/2021 Target Resolution Date: 03/26/2021 Goal Status: Unmet Unmet Reason: probable osteomyelitis Ulcer/skin breakdown will have a volume reduction of 50% by week 8 Date Initiated: 04/15/2021 Date Inactivated: 04/21/2021 Target Resolution Date: 04/22/2021 Goal Status: Met Ulcer/skin breakdown will have a volume reduction of 80% by week 12 Date Initiated: 04/21/2021 Target Resolution Date: 10/10/2021 Goal Status: Active Interventions: Assess patient/caregiver  ability to obtain necessary supplies Assess patient/caregiver ability to perform ulcer/skin care regimen upon admission and as needed Assess ulceration(s) every visit Notes: 06/02/21: Patient had  transmet 10/27. Incision dehisced, wound care ongoing. Electronic Signature(s) Signed: 09/19/2021 12:46:23 PM By: Kevin Pilling RN, BSN Entered By: [name] on 09/19/2021 10:46:45 -------------------------------------------------------------------------------- Pain Assessment Details Patient Name: Date of Service: [name]. 09/19/2021 10:30 A M Medical Record Number: [id number] Patient Account Number: [id number] Date of Birth/Sex: Treating RN: 1956/04/05 (66 y.o. [name] Primary Care Myles Tavella: [name] Other Clinician: Referring Bernardine Langworthy: Treating Kenedie Dirocco/Extender: [name] in Treatment: 28 Active Problems Location of Pain Severity and Description of Pain Patient Has Paino No Site Locations Rate the pain. Current Pain Level: 0 Pain Management and Medication Current Pain Management: Medication: No Cold Application: No Rest: No Massage: No Activity: No T.E.N.S.: No Heat Application: No Leg drop or elevation: No Is the Current Pain Management Adequate: Adequate How does your wound impact your activities of daily livingo Sleep: No Bathing: No Appetite: No Relationship With Others: No Bladder Continence: No Emotions: No Bowel Continence: No Work: No Toileting: No Drive: No Dressing: No Hobbies: No Engineer, maintenance) Signed: 09/19/2021 12:46:23 PM By: Kevin Pilling RN, BSN Entered By: [name] on 09/19/2021 10:30:17 -------------------------------------------------------------------------------- Patient/Caregiver Education Details Patient Name: Date of Service: Zirbes, Krosby W. 3/3/2023andnbsp10:30 [place] Record Number: [id number] Patient Account Number: [id number] Date of Birth/Gender: Treating RN: 04-Feb-1956 (66 y.o. [name] Primary Care Physician: [name] Other Clinician: Referring Physician: Treating Physician/Extender: [name] in Treatment:  28 Education Assessment Education Provided To: Patient Education Topics Provided Elevated Blood Sugar/ Impact on Healing: Handouts: Elevated Blood Sugars: How Do They Affect Wound Healing Methods: Explain/Verbal Responses: Reinforcements needed Electronic Signature(s) Signed: 09/19/2021 12:46:23 PM By: Kevin Pilling RN, BSN Entered By: [name] on 09/19/2021 10:46:56 -------------------------------------------------------------------------------- Wound Assessment Details Patient Name: Date of Service: [name]. 09/19/2021 10:30 A M Medical Record Number: [id number] Patient Account Number: [id number] Date of Birth/Sex: Treating RN: [birth year] (66 y.o. [name], [name] Primary Care Melchizedek Espinola: [name] Other Clinician: Referring Kyleigha Markert: Treating Laina Guerrieri/Extender: [name] in Treatment: 28 Wound Status Wound Number: 7 Primary Dehisced Wound Etiology: Wound Location: Left Amputation Site - Transmetatarsal Wound Open Wounding Event: Surgical Injury Status: Date Acquired: 05/20/2021 Comorbid Chronic Obstructive Pulmonary Disease (COPD), Sleep Apnea, Weeks Of Treatment: 15 History: Coronary Artery Disease, Hypertension, Type II Diabetes Clustered Wound: No Photos Wound Measurements Length: (cm) 0.1 Width: (cm) 0.1 Depth: (cm) 0.3 Area: (cm) 0.008 Volume: (cm) 0.002 % Reduction in Area: 99.9% % Reduction in Volume: 99.9% Epithelialization: Large (67-100%) Tunneling: No Undermining: No Wound Description Classification: Full Thickness Without Exposed Support Structures Wound Margin: Well defined, not attached Exudate Amount: None Present Foul Odor After Cleansing: No Slough/Fibrino No Wound Bed Granulation Amount: None Present (0%) Exposed Structure Necrotic Amount: Medium (34-66%) Fascia Exposed: No Fat Layer (Subcutaneous Tissue) Exposed: No Tendon Exposed: No Muscle Exposed: No Joint Exposed: No Bone Exposed:  No Assessment Notes callous to periwound. Treatment Notes Wound #7 (Amputation Site - Transmetatarsal) Wound Laterality: Left Cleanser Soap and Water Discharge Instruction: May shower and wash wound with dial antibacterial soap and water prior to dressing change. Wound Cleanser Discharge Instruction: Cleanse the wound with wound cleanser prior to applying a clean dressing using gauze sponges, not tissue or cotton balls. Peri-Wound Care Skin Prep Discharge Instruction: Use skin prep as directed Topical Gentamicin Discharge Instruction: Apply directly into  wound bed. May use a Q-tip to gently apply a thin layer. Primary Dressing Secondary Dressing Woven Gauze Sponges 2x2 in Discharge Instruction: Apply over primary dressing as directed. Zetuvit Plus Silicone Border Dressing 4x4 (in/in) Discharge Instruction: Apply silicone border over primary dressing as directed. Secured With Compression Wrap Compression Stockings Environmental education officer) Signed: 09/19/2021 12:46:23 PM By: Kevin Pilling RN, BSN Entered By: [name] on 09/19/2021 10:42:43 -------------------------------------------------------------------------------- Vitals Details Patient Name: Date of Service: [name]. 09/19/2021 10:30 A M Medical Record Number: [id number] Patient Account Number: [id number] Date of Birth/Sex: Treating RN: 1955/07/23 (66 y.o. [name], [name] Primary Care Theodis Kinsel: Other Clinician: Carolee [name] Referring Ximena Todaro: Treating Miyu Fenderson/Extender: [name] in Treatment: 28 Vital Signs Time Taken: 10:28 Temperature (F): 98.2 Height (in): 69 Pulse (bpm): 112 Weight (lbs): 215 Respiratory Rate (breaths/min): 20 Body Mass Index (BMI): 31.7 Blood Pressure (mmHg): 99/67 Capillary Blood Glucose (mg/dl): 167 Reference Range: 80 - 120 mg / dl Electronic Signature(s) Signed: 09/19/2021 12:46:23 PM By: Kevin Pilling RN, BSN Entered By: [name] on 09/19/2021 10:30:09

## 2021-09-22 DIAGNOSIS — J449 Chronic obstructive pulmonary disease, unspecified: Secondary | ICD-10-CM | POA: Diagnosis not present

## 2021-10-03 ENCOUNTER — Encounter (HOSPITAL_BASED_OUTPATIENT_CLINIC_OR_DEPARTMENT_OTHER): Payer: Medicare Other | Admitting: Internal Medicine

## 2021-10-03 ENCOUNTER — Other Ambulatory Visit: Payer: Self-pay

## 2021-10-03 DIAGNOSIS — L97528 Non-pressure chronic ulcer of other part of left foot with other specified severity: Secondary | ICD-10-CM | POA: Diagnosis not present

## 2021-10-03 DIAGNOSIS — E11621 Type 2 diabetes mellitus with foot ulcer: Secondary | ICD-10-CM

## 2021-10-03 DIAGNOSIS — Z9981 Dependence on supplemental oxygen: Secondary | ICD-10-CM | POA: Diagnosis not present

## 2021-10-03 DIAGNOSIS — J449 Chronic obstructive pulmonary disease, unspecified: Secondary | ICD-10-CM | POA: Diagnosis not present

## 2021-10-03 DIAGNOSIS — T8781 Dehiscence of amputation stump: Secondary | ICD-10-CM | POA: Diagnosis not present

## 2021-10-03 NOTE — Progress Notes (Signed)
Lapoint Dois W. ([id number]) ?, ?Visit Report for 10/03/2021 ?Arrival Information Details ?Patient Name: Date of Service: ?RAMEZ, ARRONA W. 10/03/2021 10:30 A M ?Medical Record Number: [id number] ?Patient Account Number: [id number] ?Date of Birth/Sex: Treating RN: ?12-26-1955 (66 y.o. [name], Lauren ?Primary Care Meleny Tregoning: Carolee Rota Other Clinician: ?Referring Bradon Fester: ?Treating Shavaughn Seidl/Extender: Kalman Shan ?Carolee Rota ?Weeks in Treatment: 30 ?Visit Information History Since Last Visit ?Added or deleted any medications: No ?Patient Arrived: Kasandra Knudsen ?Any new allergies or adverse reactions: No ?Arrival Time: 10:44 ?Had a fall or experienced change in No ?Accompanied By: self ?activities of daily living that may affect ?Transfer Assistance: None ?risk of falls: ?Patient Identification Verified: Yes ?Signs or symptoms of abuse/neglect since last visito No ?Secondary Verification Process Completed: Yes ?Hospitalized since last visit: No ?Patient Requires Transmission-Based Precautions: No ?Implantable device outside of the clinic excluding No ?Patient Has Alerts: Yes ?cellular tissue based products placed in the center ?Patient Alerts: ?ABI's: R: 0.82 L:0.76 since last visit: ?Has Dressing in Place as Prescribed: Yes ?Pain Present Now: No ?Electronic Signature(s) ?Signed: 10/03/2021 12:57:05 PM By: Rhae Hammock RN ?Entered By: Rhae Hammock on 10/03/2021 10:45:26 ?-------------------------------------------------------------------------------- ?Lower Extremity Assessment Details ?Patient Name: Date of Service: ?ELCHONON, MAXSON W. 10/03/2021 10:30 A M ?Medical Record Number: [id number] ?Patient Account Number: [id number] ?Date of Birth/Sex: Treating RN: ?03-30-1956 (66 y.o. [name], Lauren ?Primary Care Vlada Uriostegui: Carolee Rota Other Clinician: ?Referring Tuwanda Vokes: ?Treating Shawnese Magner/Extender: Kalman Shan ?Carolee Rota ?Weeks in Treatment: 30 ?Edema Assessment ?Assessed: [Left: Yes] [Right:  No] ?Edema: [Left: N] [Right: o] ?Calf ?Left: Right: ?Point of Measurement: 37 cm From Medial Instep 36.5 cm ?Ankle ?Left: Right: ?Point of Measurement: 8 cm From Medial Instep 28.4 cm ?Vascular Assessment ?Pulses: ?Dorsalis Pedis ?Palpable: [Left:Yes] ?Posterior Tibial ?Palpable: [Left:Yes] ?Electronic Signature(s) ?Signed: 10/03/2021 12:57:05 PM By: Rhae Hammock RN ?Entered By: Rhae Hammock on 10/03/2021 10:46:39 ?-------------------------------------------------------------------------------- ?Multi Wound Chart Details ?Patient Name: ?Date of Service: ?HUXTON, GLAUS W. 10/03/2021 10:30 A M ?Medical Record Number: [id number] ?Patient Account Number: [id number] ?Date of Birth/Sex: ?Treating RN: ?1956-05-01 (66 y.o. M) ?Primary Care Kylen Ismael: Carolee Rota ?Other Clinician: ?Referring Dore Oquin: ?Treating Tricha Ruggirello/Extender: Kalman Shan ?Carolee Rota ?Weeks in Treatment: 30 ?Vital Signs ?Height(in): 69 ?Capillary Blood Glucose(mg/dl): 256 ?Weight(lbs): 215 ?Pulse(bpm): 74 ?Body Mass Index(BMI): 31.7 ?Blood Pressure(mmHg): 86/58 ?Temperature(??F): 98.7 ?Respiratory Rate(breaths/min): 17 ?Photos: [N/A:N/A] ?Left Amputation Site - Transmetatarsal N/A N/A ?Wound Location: ?Surgical Injury N/A N/A ?Wounding Event: ?Dehisced Wound N/A N/A ?Primary Etiology: ?Chronic Obstructive Pulmonary N/A N/A ?Comorbid History: ?Disease (COPD), Sleep Apnea, ?Coronary Artery Disease, ?Hypertension, Type II Diabetes ?05/20/2021 N/A N/A ?Date Acquired: ?37 N/A N/A ?Weeks of Treatment: ?Open N/A N/A ?Wound Status: ?No N/A N/A ?Wound Recurrence: ?0.1x0.1x0.3 N/A N/A ?Measurements L x W x D (cm) ?0.008 N/A N/A ?A (cm?) : ?rea ?0.002 N/A N/A ?Volume (cm?) : ?99.90% N/A N/A ?% Reduction in A rea: ?99.90% N/A N/A ?% Reduction in Volume: ?Full Thickness Without Exposed N/A N/A ?Classification: ?Support Structures ?None Present N/A N/A ?Exudate A mount: ?Well defined, not attached N/A N/A ?Wound Margin: ?None Present (0%) N/A  N/A ?Granulation A mount: ?Medium (34-66%) N/A N/A ?Necrotic A mount: ?Fascia: No N/A N/A ?Exposed Structures: ?Fat Layer (Subcutaneous Tissue): No ?Tendon: No ?Muscle: No ?Joint: No ?Bone: No ?Large (67-100%) N/A N/A ?Epithelialization: ?Debridement - Excisional N/A N/A ?Debridement: ?Pre-procedure Verification/Time Out 11:00 N/A N/A ?Taken: ?Lidocaine N/A N/A ?Pain Control: ?Callus, Subcutaneous N/A N/A ?Tissue Debrided: ?Skin/Subcutaneous Tissue N/A N/A ?Level: ?0.16 N/A N/A ?Debridement A (sq cm): ?rea ?Curette N/A N/A ?Instrument: ?  Minimum N/A N/A ?Bleeding: ?Pressure N/A N/A ?Hemostasis Achieved: ?0 N/A N/A ?Procedural Pain: ?0 N/A N/A ?Post Procedural Pain: ?Procedure was tolerated well N/A N/A ?Debridement Treatment Response: ?0.3x0.4x0.3 N/A N/A ?Post Debridement Measurements L x ?W x D (cm) ?0.028 N/A N/A ?Post Debridement Volume: (cm?) ?Debridement N/A N/A ?Procedures Performed: ?Treatment Notes ?Electronic Signature(s) ?Signed: 10/03/2021 12:29:21 PM By: Kalman Shan DO ?Entered By: Kalman Shan on 10/03/2021 11:41:59 ?-------------------------------------------------------------------------------- ?Pain Assessment Details ?Patient Name: ?Date of Service: ?DONAL, LYNAM W. 10/03/2021 10:30 A M ?Medical Record Number: [id number] ?Patient Account Number: [id number] ?Date of Birth/Sex: ?Treating RN: ?March 29, 1956 (66 y.o. [name], Lauren ?Primary Care Peni Rupard: Carolee Rota ?Other Clinician: ?Referring Glender Augusta: ?Treating Robbin Loughmiller/Extender: Kalman Shan ?Carolee Rota ?Weeks in Treatment: 30 ?Active Problems ?Location of Pain Severity and Description of Pain ?Patient Has Paino No ?Site Locations ?Pain Management and Medication ?Current Pain Management: ?Electronic Signature(s) ?Signed: 10/03/2021 12:57:05 PM By: Rhae Hammock RN ?Entered By: Rhae Hammock on 10/03/2021 10:46:30 ?-------------------------------------------------------------------------------- ?Patient/Caregiver Education  Details ?Patient Name: ?Date of Service: ?Dutko, Hawken W. 3/17/2023andnbsp10:30 A M ?Medical Record Number: [id number] ?Patient Account Number: [id number] ?Date of Birth/Gender: ?Treating RN: ?[birth year] (66 y.o. [name], Lauren ?Primary Care Physician: Carolee Rota ?Other Clinician: ?Referring Physician: ?Treating Physician/Extender: Kalman Shan ?Carolee Rota ?Weeks in Treatment: 30 ?Education Assessment ?Education Provided To: ?Patient ?Education Topics Provided ?Wound/Skin Impairment: ?Methods: Explain/Verbal ?Responses: State content correctly ?Electronic Signature(s) ?Signed: 10/03/2021 12:57:05 PM By: Rhae Hammock RN ?Entered By: Rhae Hammock on 10/03/2021 10:56:19 ?-------------------------------------------------------------------------------- ?Wound Assessment Details ?Patient Name: ?Date of Service: ?NICKOLAI, RINKS W. 10/03/2021 10:30 A M ?Medical Record Number: [id number] ?Patient Account Number: [id number] ?Date of Birth/Sex: ?Treating RN: ?10-Jun-1956 (66 y.o. [name], Lauren ?Primary Care Mariam Helbert: Carolee Rota ?Other Clinician: ?Referring Jerlene Rockers: ?Treating Lenin Kuhnle/Extender: Kalman Shan ?Carolee Rota ?Weeks in Treatment: 30 ?Wound Status ?Wound Number: 7 Primary Dehisced Wound ?Etiology: ?Wound Location: Left Amputation Site - Transmetatarsal ?Wound Open ?Wounding Event: Surgical Injury ?Status: ?Date Acquired: 05/20/2021 ?Comorbid Chronic Obstructive Pulmonary Disease (COPD), Sleep Apnea, ?Weeks Of Treatment: 17 ?History: Coronary Artery Disease, Hypertension, Type II Diabetes ?Clustered Wound: No ?Photos ?Wound Measurements ?Length: (cm) 0.1 ?Width: (cm) 0.1 ?Depth: (cm) 0.3 ?Area: (cm?) 0.008 ?Volume: (cm?) 0.002 ?% Reduction in Area: 99.9% ?% Reduction in Volume: 99.9% ?Epithelialization: Large (67-100%) ?Wound Description ?Classification: Full Thickness Without Exposed Support Structures ?Wound Margin: Well defined, not attached ?Exudate Amount: None Present ?Foul  Odor After Cleansing: No ?Slough/Fibrino No ?Wound Bed ?Granulation Amount: None Present (0%) Exposed Structure ?Necrotic Amount: Medium (34-66%) ?Fascia Exposed: No ?Fat Layer (Subcutaneous Tissue) Exposed: N

## 2021-10-03 NOTE — Progress Notes (Signed)
Kevin Bryse W. ([id number]) ?, ?Visit Report for 10/03/2021 ?Chief Complaint Document Details ?Patient Name: Date of Service: ?[name], Kevin W. 10/03/2021 10:30 A M ?Medical Record Number: [id number] ?Patient Account Number: [id number] ?Date of Birth/Sex: Treating RN: ?09/04/1955 (66 y.o. M) ?Primary Care Provider: Carolee Rota Other Clinician: ?Referring Provider: ?Treating Provider/Extender: Kalman Shan ?Carolee Rota ?Weeks in Treatment: 30 ?Information Obtained from: Patient ?Chief Complaint ?Nonhealing wound secondary to Wound dehiscence status post transmetatarsal amputation on the left ?Electronic Signature(s) ?Signed: 10/03/2021 12:29:21 PM By: Kalman Shan DO ?Entered By: Kalman Shan on 10/03/2021 11:42:32 ?-------------------------------------------------------------------------------- ?Debridement Details ?Patient Name: Date of Service: ?[name], Kevin W. 10/03/2021 10:30 A M ?Medical Record Number: [id number] ?Patient Account Number: [id number] ?Date of Birth/Sex: Treating RN: ?06-Aug-1955 (66 y.o. [name], Lauren ?Primary Care Provider: Carolee Rota Other Clinician: ?Referring Provider: ?Treating Provider/Extender: Kalman Shan ?Carolee Rota ?Weeks in Treatment: 30 ?Debridement Performed for Assessment: Wound #7 Left Amputation Site - Transmetatarsal ?Performed By: Physician Kalman Shan, DO ?Debridement Type: Debridement ?Level of Consciousness (Pre-procedure): Awake and Alert ?Pre-procedure Verification/Time Out Yes - 11:00 ?Taken: ?Start Time: 11:00 ?Pain Control: Lidocaine ?T Area Debrided (L x W): ?otal 0.4 (cm) x 0.4 (cm) = 0.16 (cm?) ?Tissue and other material debrided: ?Viable, Non-Viable, Callus, Subcutaneous, Skin: Dermis ?Level: Skin/Subcutaneous Tissue ?Debridement Description: Excisional ?Instrument: Curette ?Bleeding: Minimum ?Hemostasis Achieved: Pressure ?End Time: 11:00 ?Procedural Pain: 0 ?Post Procedural Pain: 0 ?Response to Treatment: Procedure was tolerated  well ?Level of Consciousness (Post- Awake and Alert ?procedure): ?Post Debridement Measurements of Total Wound ?Length: (cm) 0.3 ?Width: (cm) 0.4 ?Depth: (cm) 0.3 ?Volume: (cm?) 0.028 ?Character of Wound/Ulcer Post Debridement: Improved ?Post Procedure Diagnosis ?Same as Pre-procedure ?Electronic Signature(s) ?Signed: 10/03/2021 12:29:21 PM By: Kalman Shan DO ?Signed: 10/03/2021 12:57:05 PM By: Rhae Hammock RN ?Entered By: Rhae Hammock on 10/03/2021 11:03:32 ?-------------------------------------------------------------------------------- ?HPI Details ?Patient Name: Date of Service: ?[name], Kevin W. 10/03/2021 10:30 A M ?Medical Record Number: [id number] ?Patient Account Number: [id number] ?Date of Birth/Sex: Treating RN: ?12/18/1955 (66 y.o. M) ?Primary Care Provider: Carolee Rota Other Clinician: ?Referring Provider: ?Treating Provider/Extender: Kalman Shan ?Carolee Rota ?Weeks in Treatment: 30 ?History of Present Illness ?HPI Description: Admission 8/18 ?Mr. Elisha Cooksey is a 66 year old male with a past medical history of uncontrolled insulin-dependent type 2 diabetes, COPD On O2 6 L via nasal cannula That ?presents to the clinic for a 58-monthhistory of nonhealing wound to the left great toe amputation site. He has been following with podiatry for a wound to his left ?great toe that developed osteomyelitis and subsequently required amputation. He had the surgery done on 6/10. He had a partial first ray amputation by Dr. ?Patel. Since his surgery he has been placing iodine on the wound bed. He states that there has been no improvement to the wound healing in the past 2 ?months. He states he has occasional intermittent pain to the area. He denies systemic signs of infection. ?8/25; patient presents for 1 week follow-up. He states he has been using silver alginate daily without issues. He has no complaints today. He denies signs of ?infection. He is scheduled to have his MRI on 8/27. ?9/15;  patient presents for follow-up. He missed his last clinic appointment due to feeling ill. He continues to use silver alginate daily without any issues. He ?reports improvement in wound healing. He currently denies signs of infection. ?9/27; patient presents for follow-up. He has developed a new wound to the second left toe. He is unaware of this and was noted on exam today. He  continues ?to use silver alginate to the amputation site wound. He has no issues or complaints today. He currently denies signs of infection. ?10/4; patient presents for follow-up. He is using silver alginate to the wound sites. He has no issues or complaints today. He denies systemic signs of ?infection. ?10/10; patient presents for follow-up. He has been using silver alginate to the wound sites. He states that his foot became more swollen over the weekend, But ?has since improved. He overall feels okay. ?Admission 06/02/2021 ?Patient presents for nonhealing wound secondary to wound dehiscence following a transmetatarsal amputation. He states he has been using Betadine wet-to- ?dry dressings to the wound bed. He states the wound has been open for the past 3 weeks. He currently denies Pain or signs of infection. ?11/21; patient presents for follow-up. He has home health but states this will end in 2 weeks per insurance. He has no issues or complaints today. He denies ?signs of infection. ?11/28; patient presents for follow-up. He states that home health will be stopped at the end of the week. He has no issues or complaints today. ?12/12; patient presents for follow-up. He states he saw Dr. Posey Pronto on 11/30 and was prescribed doxycycline for cellulitis. He currently reports more erythema ?and pain over the past week. He states his sugars have been very high and in the 400s. His family encouraged him to go to the ED over the weekend ?however he declined. ?1/20; patient presents for follow-up. He reports having an arteriogram with stenting placed  to the left lower extremity. He has been using Hydrofera Blue to the ?wound bed with improvement in wound healing. He denies signs of infection. ?2/3; patient presents for follow-up. He has been using Hydrofera Blue to the wound beds without issues. He reports improvement in wound healing. He denies ?signs of infection. ?2/17; patient presents for follow-up. He has been using Hydrofera Blue to the wound beds without issues. He reports continued improvement in wound healing. ?He denies signs of infection. ?3/3; patient presents for follow-up. He has been using Hydrofera Blue to the wound beds. Home health has discharged the patient. He currently denies signs of ?infection. ?3/17; patient presents for follow-up. He has been using gentamicin ointment to the wound bed. He has no issues or complaints today. ?Electronic Signature(s) ?Signed: 10/03/2021 12:29:21 PM By: Kalman Shan DO ?Entered By: Kalman Shan on 10/03/2021 11:43:28 ?-------------------------------------------------------------------------------- ?Physical Exam Details ?Patient Name: Date of Service: ?[name], Kevin W. 10/03/2021 10:30 A M ?Medical Record Number: [id number] ?Patient Account Number: [id number] ?Date of Birth/Sex: Treating RN: ?09-23-1955 (66 y.o. M) ?Primary Care Provider: Carolee Rota Other Clinician: ?Referring Provider: ?Treating Provider/Extender: Kalman Shan ?Carolee Rota ?Weeks in Treatment: 30 ?Constitutional ?respirations regular, non-labored and within target range for patient.[name] ?Cardiovascular ?2+ dorsalis pedis/posterior tibialis pulses. ?Psychiatric ?pleasant and cooperative. ?Notes ?Left foot: Transmetatarsal amputation. Open wound with nonviable tissue and granulation tissue. No surrounding signs of infection. ?Electronic Signature(s) ?Signed: 10/03/2021 12:29:21 PM By: Kalman Shan DO ?Entered By: Kalman Shan on 10/03/2021  11:44:05 ?-------------------------------------------------------------------------------- ?Physician Orders Details ?Patient Name: Date of Service: ?KALIM, KISSEL W. 10/03/2021 10:30 A M ?Medical Record Number: [id number] ?Patient Account Number: [id number] ?Date of Birth/Sex: Treating RN:

## 2021-10-09 ENCOUNTER — Other Ambulatory Visit: Payer: Self-pay

## 2021-10-09 ENCOUNTER — Encounter (HOSPITAL_BASED_OUTPATIENT_CLINIC_OR_DEPARTMENT_OTHER): Payer: Medicare Other | Admitting: Internal Medicine

## 2021-10-09 DIAGNOSIS — Z9981 Dependence on supplemental oxygen: Secondary | ICD-10-CM | POA: Diagnosis not present

## 2021-10-09 DIAGNOSIS — L97528 Non-pressure chronic ulcer of other part of left foot with other specified severity: Secondary | ICD-10-CM

## 2021-10-09 DIAGNOSIS — T8131XD Disruption of external operation (surgical) wound, not elsewhere classified, subsequent encounter: Secondary | ICD-10-CM

## 2021-10-09 DIAGNOSIS — T8781 Dehiscence of amputation stump: Secondary | ICD-10-CM | POA: Diagnosis not present

## 2021-10-09 DIAGNOSIS — J449 Chronic obstructive pulmonary disease, unspecified: Secondary | ICD-10-CM

## 2021-10-09 DIAGNOSIS — E11621 Type 2 diabetes mellitus with foot ulcer: Secondary | ICD-10-CM | POA: Diagnosis not present

## 2021-10-09 NOTE — Progress Notes (Signed)
Kevin Ercil W. ([id number]) ?, ?Visit Report for 10/09/2021 ?Fall Risk Assessment Details ?Patient Name: Date of Service: ?[name], [name] 10/09/2021 12:30 PM ?Medical Record Number: [id number] ?Patient Account Number: [id number] ?Date of Birth/Sex: Treating RN: ?[birth year] (66 y.o. M) Rolin Barry, Bobbi ?Primary Care Kyiesha Millward: Carolee Rota Other Clinician: ?Referring Jadi Deyarmin: ?Treating Zenith Kercheval/Extender: Kalman Shan ?Carolee Rota ?Weeks in Treatment: 31 ?Fall Risk Assessment Items ?Have you had 2 or more falls in the last 12 monthso 0 Yes ?Have you had any fall that resulted in injury in the last 12 monthso 0 Yes ?FALLS RISK SCREEN ?History of falling - immediate or within 3 months 25 Yes ?Secondary diagnosis (Do you have 2 or more medical diagnoseso) 0 No ?Ambulatory aid ?None/bed rest/wheelchair/nurse 0 No ?Crutches/cane/walker 15 Yes ?Furniture 0 No ?Intravenous therapy Access/Saline/Heparin Lock 0 No ?Gait/Transferring ?Normal/ bed rest/ wheelchair 0 No ?Weak (short steps with or without shuffle, stooped but able to lift head while walking, may seek 10 Yes ?support from furniture) ?Impaired (short steps with shuffle, may have difficulty arising from chair, head down, impaired 0 No ?balance) ?Mental Status ?Oriented to own ability 0 Yes ?Electronic Signature(s) ?Signed: 10/09/2021 5:33:59 PM By: Deon Pilling RN, BSN ?Entered By: Deon Pilling on 10/09/2021 12:43:52 ?

## 2021-10-10 NOTE — Progress Notes (Signed)
[place] Jerl W. ([id number]) ?, ?Visit Report for 10/09/2021 ?Chief Complaint Document Details ?Patient Name: Date of Service: ?RENDON, HOWELL 10/09/2021 12:30 PM ?Medical Record Number: [id number] ?Patient Account Number: [id number] ?Date of Birth/Sex: Treating RN: ?10/17/1955 (66 y.o. [name], Lauren ?Primary Care Provider: Carolee Rota Other Clinician: ?Referring Provider: ?Treating Provider/Extender: Kalman Shan ?Carolee Rota ?Weeks in Treatment: 31 ?Information Obtained from: Patient ?Chief Complaint ?Nonhealing wound secondary to Wound dehiscence status post transmetatarsal amputation on the left ?Electronic Signature(s) ?Signed: 10/09/2021 12:59:02 PM By: Kalman Shan DO ?Entered By: Kalman Shan on 10/09/2021 12:54:19 ?-------------------------------------------------------------------------------- ?HPI Details ?Patient Name: Date of Service: ?MILO, SCHREIER 10/09/2021 12:30 PM ?Medical Record Number: [id number] ?Patient Account Number: [id number] ?Date of Birth/Sex: Treating RN: ?11-12-1955 (66 y.o. [name], Lauren ?Primary Care Provider: Carolee Rota Other Clinician: ?Referring Provider: ?Treating Provider/Extender: Kalman Shan ?Carolee Rota ?Weeks in Treatment: 31 ?History of Present Illness ?HPI Description: Admission 8/18 ?Mr. Becket Wecker is a 66 year old male with a past medical history of uncontrolled insulin-dependent type 2 diabetes, COPD On O2 6 L via nasal cannula That ?presents to the clinic for a 30-monthhistory of nonhealing wound to the left great toe amputation site. He has been following with podiatry for a wound to his left ?great toe that developed osteomyelitis and subsequently required amputation. He had the surgery done on 6/10. He had a partial first ray amputation by Dr. ?Patel. Since his surgery he has been placing iodine on the wound bed. He states that there has been no improvement to the wound healing in the past 2 ?months. He states he has  occasional intermittent pain to the area. He denies systemic signs of infection. ?8/25; patient presents for 1 week follow-up. He states he has been using silver alginate daily without issues. He has no complaints today. He denies signs of ?infection. He is scheduled to have his MRI on 8/27. ?9/15; patient presents for follow-up. He missed his last clinic appointment due to feeling ill. He continues to use silver alginate daily without any issues. He ?reports improvement in wound healing. He currently denies signs of infection. ?9/27; patient presents for follow-up. He has developed a new wound to the second left toe. He is unaware of this and was noted on exam today. He continues ?to use silver alginate to the amputation site wound. He has no issues or complaints today. He currently denies signs of infection. ?10/4; patient presents for follow-up. He is using silver alginate to the wound sites. He has no issues or complaints today. He denies systemic signs of ?infection. ?10/10; patient presents for follow-up. He has been using silver alginate to the wound sites. He states that his foot became more swollen over the weekend, But ?has since improved. He overall feels okay. ?Admission 06/02/2021 ?Patient presents for nonhealing wound secondary to wound dehiscence following a transmetatarsal amputation. He states he has been using Betadine wet-to- ?dry dressings to the wound bed. He states the wound has been open for the past 3 weeks. He currently denies Pain or signs of infection. ?11/21; patient presents for follow-up. He has home health but states this will end in 2 weeks per insurance. He has no issues or complaints today. He denies ?signs of infection. ?11/28; patient presents for follow-up. He states that home health will be stopped at the end of the week. He has no issues or complaints today. ?12/12; patient presents for follow-up. He states he saw Dr. PPosey Prontoon 11/30 and was prescribed doxycycline for  cellulitis. He  currently reports more erythema ?and pain over the past week. He states his sugars have been very high and in the 400s. His family encouraged him to go to the ED over the weekend ?however he declined. ?1/20; patient presents for follow-up. He reports having an arteriogram with stenting placed to the left lower extremity. He has been using Hydrofera Blue to the ?wound bed with improvement in wound healing. He denies signs of infection. ?2/3; patient presents for follow-up. He has been using Hydrofera Blue to the wound beds without issues. He reports improvement in wound healing. He denies ?signs of infection. ?2/17; patient presents for follow-up. He has been using Hydrofera Blue to the wound beds without issues. He reports continued improvement in wound healing. ?He denies signs of infection. ?3/3; patient presents for follow-up. He has been using Hydrofera Blue to the wound beds. Home health has discharged the patient. He currently denies signs of ?infection. ?3/17; patient presents for follow-up. He has been using gentamicin ointment to the wound bed. He has no issues or complaints today. ?3/23; patient presents for follow-up. He has been using collagen. He has no issues or complaints today. He reports improvement in wound healing. ?Electronic Signature(s) ?Signed: 10/09/2021 12:59:02 PM By: Kalman Shan DO ?Entered By: Kalman Shan on 10/09/2021 12:54:44 ?-------------------------------------------------------------------------------- ?Physical Exam Details ?Patient Name: Date of Service: ?SHELLIE, GOETTL 10/09/2021 12:30 PM ?Medical Record Number: [id number] ?Patient Account Number: [id number] ?Date of Birth/Sex: Treating RN: ?[birth year] (66 y.o. [name], Lauren ?Primary Care Provider: Carolee Rota Other Clinician: ?Referring Provider: ?Treating Provider/Extender: Kalman Shan ?Carolee Rota ?Weeks in Treatment: 31 ?Constitutional ?respirations regular, non-labored and within  target range for patient.[name] ?Cardiovascular ?2+ dorsalis pedis/posterior tibialis pulses. ?Psychiatric ?pleasant and cooperative. ?Notes ?Left foot: Transmetatarsal amputation. Very small open wound with granulation tissue at the base. No tunneling or undermining. No surrounding signs of ?infection. Appears well-healing. ?Electronic Signature(s) ?Signed: 10/09/2021 12:59:02 PM By: Kalman Shan DO ?Entered By: Kalman Shan on 10/09/2021 12:55:29 ?-------------------------------------------------------------------------------- ?Physician Orders Details ?Patient Name: Date of Service: ?DONDRE, CATALFAMO 10/09/2021 12:30 PM ?Medical Record Number: [id number] ?Patient Account Number: [id number] ?Date of Birth/Sex: Treating RN: ?[birth year] (66 y.o. [name], Lauren ?Primary Care Provider: Carolee Rota Other Clinician: ?Referring Provider: ?Treating Provider/Extender: Kalman Shan ?Carolee Rota ?Weeks in Treatment: 31 ?Verbal / Phone Orders: No ?Diagnosis Coding ?ICD-10 Coding ?Code Description ?L97.528 Non-pressure chronic ulcer of other part of left foot with other specified severity ?T81.31XD Disruption of external operation (surgical) wound, not elsewhere classified, subsequent encounter ?E11.621 Type 2 diabetes mellitus with foot ulcer ?J44.9 Chronic obstructive pulmonary disease, unspecified ?Follow-up Appointments ?ppointment in 1 week. - DR. Jacky Dross and [facility] Room # 9 ?Return A ?Bathing/ Shower/ Hygiene ?May shower with protection but do not get wound dressing(s) wet. ?Edema Control - Lymphedema / SCD / Other ?Elevate legs to the level of the heart or above for 30 minutes daily and/or when sitting, a frequency of: - throughout the day ?Avoid standing for long periods of time. ?Moisturize legs daily. - both legs and feet every night before bed. ?Off-Loading ?DH Walker Boot to: - left foot per surgeon ?Additional Orders / Instructions ?Follow Nutritious Diet - Monitor and keep blood sugars well  controlled ?Wound Treatment ?Wound #7 - Amputation Site - Transmetatarsal Wound Laterality: Left ?Cleanser: Soap and Water 1 x Per Day/30 Days ?Discharge Instructions: May shower and wash wound with dial antibacterial soap and wa

## 2021-10-10 NOTE — Progress Notes (Signed)
[place] Greogry W. ([id number]) ?, ?Visit Report for 10/09/2021 ?Arrival Information Details ?Patient Name: Date of Service: ?[name], [name] 10/09/2021 12:30 PM ?Medical Record Number: [id number] ?Patient Account Number: [id number] ?Date of Birth/Sex: Treating RN: ?1955/11/03 (66 y.o. M) Rolin Barry, Bobbi ?Primary Care Freeman Borba: Carolee Rota Other Clinician: ?Referring Tywanna Seifer: ?Treating Artavia Jeanlouis/Extender: Kalman Shan ?Carolee Rota ?Weeks in Treatment: 31 ?Visit Information History Since Last Visit ?Added or deleted any medications: No ?Patient Arrived: Kasandra Knudsen ?Any new allergies or adverse reactions: No ?Arrival Time: 12:35 ?Had a fall or experienced change in Yes ?Accompanied By: self ?activities of daily living that may affect ?Transfer Assistance: None ?risk of falls: ?Patient Identification Verified: Yes ?Signs or symptoms of abuse/neglect since last visito No ?Secondary Verification Process Completed: Yes ?Hospitalized since last visit: No ?Patient Requires Transmission-Based Precautions: No ?Implantable device outside of the clinic excluding No ?Patient Has Alerts: Yes ?cellular tissue based products placed in the center ?Patient Alerts: ?ABI's: R: 0.82 L:0.76 since last visit: ?Has Dressing in Place as Prescribed: Yes ?Pain Present Now: Yes ?Electronic Signature(s) ?Signed: 10/09/2021 5:33:59 PM By: Deon Pilling RN, BSN ?Entered By: Deon Pilling on 10/09/2021 12:39:38 ?-------------------------------------------------------------------------------- ?Clinic Level of Care Assessment Details ?Patient Name: Date of Service: ?[name], [name] 10/09/2021 12:30 PM ?Medical Record Number: [id number] ?Patient Account Number: [id number] ?Date of Birth/Sex: Treating RN: ?[birth year] (66 y.o. [name], Lauren ?Primary Care Jernard Reiber: Carolee Rota Other Clinician: ?Referring Donell Sliwinski: ?Treating Glenisha Gundry/Extender: Kalman Shan ?Carolee Rota ?Weeks in Treatment: 31 ?Clinic Level of Care Assessment Items ?TOOL 4  Quantity Score ?X- 1 0 ?Use when only an EandM is performed on FOLLOW-UP visit ?ASSESSMENTS - Nursing Assessment / Reassessment ?X- 1 10 ?Reassessment of Co-morbidities (includes updates in patient status) ?X- 1 5 ?Reassessment of Adherence to Treatment Plan ?ASSESSMENTS - Wound and Skin A ssessment / Reassessment ?X - Simple Wound Assessment / Reassessment - one wound 1 5 ?'[]'$  - 0 ?Complex Wound Assessment / Reassessment - multiple wounds ?'[]'$  - 0 ?Dermatologic / Skin Assessment (not related to wound area) ?ASSESSMENTS - Focused Assessment ?X- 1 5 ?Circumferential Edema Measurements - multi extremities ?'[]'$  - 0 ?Nutritional Assessment / Counseling / Intervention ?'[]'$  - 0 ?Lower Extremity Assessment (monofilament, tuning fork, pulses) ?'[]'$  - 0 ?Peripheral Arterial Disease Assessment (using hand held doppler) ?ASSESSMENTS - Ostomy and/or Continence Assessment and Care ?'[]'$  - 0 ?Incontinence Assessment and Management ?'[]'$  - 0 ?Ostomy Care Assessment and Management (repouching, etc.) ?PROCESS - Coordination of Care ?X - Simple Patient / Family Education for ongoing care 1 15 ?'[]'$  - 0 ?Complex (extensive) Patient / Family Education for ongoing care ?X- 1 10 ?Staff obtains Consents, Records, T Results / Process Orders ?est ?'[]'$  - 0 ?Staff telephones HHA, Nursing Homes / Clarify orders / etc ?'[]'$  - 0 ?Routine Transfer to another Facility (non-emergent condition) ?'[]'$  - 0 ?Routine Hospital Admission (non-emergent condition) ?'[]'$  - 0 ?New Admissions / Biomedical engineer / Ordering NPWT Apligraf, etc. ?, ?'[]'$  - 0 ?Emergency Hospital Admission (emergent condition) ?X- 1 10 ?Simple Discharge Coordination ?'[]'$  - 0 ?Complex (extensive) Discharge Coordination ?PROCESS - Special Needs ?'[]'$  - 0 ?Pediatric / Minor Patient Management ?'[]'$  - 0 ?Isolation Patient Management ?'[]'$  - 0 ?Hearing / Language / Visual special needs ?'[]'$  - 0 ?Assessment of Community assistance (transportation, D/C planning, etc.) ?'[]'$  - 0 ?Additional assistance / Altered  mentation ?'[]'$  - 0 ?Support Surface(s) Assessment (bed, cushion, seat, etc.) ?INTERVENTIONS - Wound Cleansing / Measurement ?X - Simple Wound Cleansing - one wound 1 5 ?'[]'$  - 0 ?Complex  Wound Cleansing - multiple wounds ?X- 1 5 ?Wound Imaging (photographs - any number of wounds) ?'[]'$  - 0 ?Wound Tracing (instead of photographs) ?X- 1 5 ?Simple Wound Measurement - one wound ?'[]'$  - 0 ?Complex Wound Measurement - multiple wounds ?INTERVENTIONS - Wound Dressings ?X - Small Wound Dressing one or multiple wounds 1 10 ?'[]'$  - 0 ?Medium Wound Dressing one or multiple wounds ?'[]'$  - 0 ?Large Wound Dressing one or multiple wounds ?X- 1 5 ?Application of Medications - topical ?'[]'$  - 0 ?Application of Medications - injection ?INTERVENTIONS - Miscellaneous ?'[]'$  - 0 ?External ear exam ?'[]'$  - 0 ?Specimen Collection (cultures, biopsies, blood, body fluids, etc.) ?'[]'$  - 0 ?Specimen(s) / Culture(s) sent or taken to Lab for analysis ?'[]'$  - 0 ?Patient Transfer (multiple staff / Civil Service fast streamer / Similar devices) ?'[]'$  - 0 ?Simple Staple / Suture removal (25 or less) ?'[]'$  - 0 ?Complex Staple / Suture removal (26 or more) ?'[]'$  - 0 ?Hypo / Hyperglycemic Management (close monitor of Blood Glucose) ?'[]'$  - 0 ?Ankle / Brachial Index (ABI) - do not check if billed separately ?X- 1 5 ?Vital Signs ?Has the patient been seen at the hospital within the last three years: Yes ?Total Score: 95 ?Level Of Care: New/Established - Level 3 ?Electronic Signature(s) ?Signed: 10/10/2021 12:10:41 PM By: Rhae Hammock RN ?Entered By: Rhae Hammock on 10/09/2021 13:01:45 ?-------------------------------------------------------------------------------- ?Encounter Discharge Information Details ?Patient Name: Date of Service: ?[name], [name] 10/09/2021 12:30 PM ?Medical Record Number: [id number] ?Patient Account Number: [id number] ?Date of Birth/Sex: Treating RN: ?[birth year] (66 y.o. [name], Lauren ?Primary Care Kingsly Kloepfer: Carolee Rota Other Clinician: ?Referring  Tyjuan Demetro: ?Treating Wilma Michaelson/Extender: Kalman Shan ?Carolee Rota ?Weeks in Treatment: 31 ?Encounter Discharge Information Items ?Discharge Condition: Stable ?Ambulatory Status: Ambulatory ?Discharge Destination: Home ?Transportation: Private Auto ?Accompanied By: self ?Schedule Follow-up Appointment: Yes ?Clinical Summary of Care: Patient Declined ?Electronic Signature(s) ?Signed: 10/10/2021 12:10:41 PM By: Rhae Hammock RN ?Entered By: Rhae Hammock on 10/09/2021 13:04:12 ?-------------------------------------------------------------------------------- ?Lower Extremity Assessment Details ?Patient Name: Date of Service: ?[name], [name] 10/09/2021 12:30 PM ?Medical Record Number: [id number] ?Patient Account Number: [id number] ?Date of Birth/Sex: Treating RN: ?January 27, 1956 (66 y.o. M) Rolin Barry, Bobbi ?Primary Care Kailoni Vahle: Carolee Rota Other Clinician: ?Referring Myli Pae: ?Treating Lariyah Shetterly/Extender: Kalman Shan ?Carolee Rota ?Weeks in Treatment: 31 ?Edema Assessment ?Assessed: [Left: Yes] [Right: No] ?Edema: [Left: N] [Right: o] ?Calf ?Left: Right: ?Point of Measurement: 37 cm From Medial Instep 34 cm ?Ankle ?Left: Right: ?Point of Measurement: 8 cm From Medial Instep 23 cm ?Vascular Assessment ?Pulses: ?Dorsalis Pedis ?Palpable: [Left:Yes] ?Electronic Signature(s) ?Signed: 10/09/2021 5:33:59 PM By: Deon Pilling RN, BSN ?Entered By: Deon Pilling on 10/09/2021 12:41:17 ?-------------------------------------------------------------------------------- ?Multi Wound Chart Details ?Patient Name: ?Date of Service: ?[name], [name] 10/09/2021 12:30 PM ?Medical Record Number: [id number] ?Patient Account Number: [id number] ?Date of Birth/Sex: ?Treating RN: ?22-Jul-1955 (66 y.o. [name], Lauren ?Primary Care Miyani Cronic: Carolee Rota ?Other Clinician: ?Referring Reigna Ruperto: ?Treating Candise Crabtree/Extender: Kalman Shan ?Carolee Rota ?Weeks in Treatment: 31 ?Vital Signs ?Height(in): 69 ?Capillary Blood  Glucose(mg/dl): 250 ?Weight(lbs): 215 ?Pulse(bpm): 93 ?Body Mass Index(BMI): 31.7 ?Blood Pressure(mmHg): 120/70 ?Temperature(??F): 98.3 ?Respiratory Rate(breaths/min): 20 ?Photos: [N/A:N/A] ?Left Amputation Site - Transmet

## 2021-10-16 ENCOUNTER — Encounter (HOSPITAL_BASED_OUTPATIENT_CLINIC_OR_DEPARTMENT_OTHER): Payer: Medicare Other | Admitting: Internal Medicine

## 2021-10-16 DIAGNOSIS — T8131XD Disruption of external operation (surgical) wound, not elsewhere classified, subsequent encounter: Secondary | ICD-10-CM | POA: Diagnosis not present

## 2021-10-16 DIAGNOSIS — L97528 Non-pressure chronic ulcer of other part of left foot with other specified severity: Secondary | ICD-10-CM

## 2021-10-16 DIAGNOSIS — J449 Chronic obstructive pulmonary disease, unspecified: Secondary | ICD-10-CM

## 2021-10-16 DIAGNOSIS — T8781 Dehiscence of amputation stump: Secondary | ICD-10-CM | POA: Diagnosis not present

## 2021-10-16 DIAGNOSIS — Z9981 Dependence on supplemental oxygen: Secondary | ICD-10-CM | POA: Diagnosis not present

## 2021-10-16 DIAGNOSIS — E11621 Type 2 diabetes mellitus with foot ulcer: Secondary | ICD-10-CM | POA: Diagnosis not present

## 2021-10-17 NOTE — Progress Notes (Signed)
[place] Ahyan W. ([id number]) ?, ?Visit Report for 10/16/2021 ?Arrival Information Details ?Patient Name: Date of Service: ?GIDEON, BURSTEIN 10/16/2021 1:15 PM ?Medical Record Number: [id number] ?Patient Account Number: [id number] ?Date of Birth/Sex: Treating RN: ?August 15, 1955 (66 y.o. [name], Lauren ?Primary Care Koleen Celia: Carolee Rota Other Clinician: ?Referring Ishia Tenorio: ?Treating Ilena Dieckman/Extender: Kalman Shan ?Carolee Rota ?Weeks in Treatment: 32 ?Visit Information History Since Last Visit ?Added or deleted any medications: No ?Patient Arrived: Kasandra Knudsen ?Any new allergies or adverse reactions: No ?Arrival Time: 13:45 ?Had a fall or experienced change in No ?Accompanied By: self ?activities of daily living that may affect ?Transfer Assistance: None ?risk of falls: ?Patient Identification Verified: Yes ?Signs or symptoms of abuse/neglect since last visito No ?Secondary Verification Process Completed: Yes ?Hospitalized since last visit: No ?Patient Requires Transmission-Based Precautions: No ?Implantable device outside of the clinic excluding No ?Patient Has Alerts: Yes ?cellular tissue based products placed in the center ?Patient Alerts: ?ABI's: R: 0.82 L:0.76 since last visit: ?Has Dressing in Place as Prescribed: Yes ?Pain Present Now: No ?Electronic Signature(s) ?Signed: 10/17/2021 12:49:17 PM By: Rhae Hammock RN ?Entered By: Rhae Hammock on 10/16/2021 13:45:54 ?-------------------------------------------------------------------------------- ?Clinic Level of Care Assessment Details ?Patient Name: Date of Service: ?ROSA, GAMBALE 10/16/2021 1:15 PM ?Medical Record Number: [id number] ?Patient Account Number: [id number] ?Date of Birth/Sex: Treating RN: ?July 27, 1955 (66 y.o. [name], Lauren ?Primary Care Garlen Reinig: Carolee Rota Other Clinician: ?Referring Catalyna Reilly: ?Treating Johnella Crumm/Extender: Kalman Shan ?Carolee Rota ?Weeks in Treatment: 32 ?Clinic Level of Care Assessment Items ?TOOL 4  Quantity Score ?X- 1 0 ?Use when only an EandM is performed on FOLLOW-UP visit ?ASSESSMENTS - Nursing Assessment / Reassessment ?X- 1 10 ?Reassessment of Co-morbidities (includes updates in patient status) ?X- 1 5 ?Reassessment of Adherence to Treatment Plan ?ASSESSMENTS - Wound and Skin A ssessment / Reassessment ?X - Simple Wound Assessment / Reassessment - one wound 1 5 ?'[]'$  - 0 ?Complex Wound Assessment / Reassessment - multiple wounds ?'[]'$  - 0 ?Dermatologic / Skin Assessment (not related to wound area) ?ASSESSMENTS - Focused Assessment ?X- 1 5 ?Circumferential Edema Measurements - multi extremities ?'[]'$  - 0 ?Nutritional Assessment / Counseling / Intervention ?'[]'$  - 0 ?Lower Extremity Assessment (monofilament, tuning fork, pulses) ?'[]'$  - 0 ?Peripheral Arterial Disease Assessment (using hand held doppler) ?ASSESSMENTS - Ostomy and/or Continence Assessment and Care ?'[]'$  - 0 ?Incontinence Assessment and Management ?'[]'$  - 0 ?Ostomy Care Assessment and Management (repouching, etc.) ?PROCESS - Coordination of Care ?X - Simple Patient / Family Education for ongoing care 1 15 ?'[]'$  - 0 ?Complex (extensive) Patient / Family Education for ongoing care ?X- 1 10 ?Staff obtains Consents, Records, T Results / Process Orders ?est ?'[]'$  - 0 ?Staff telephones HHA, Nursing Homes / Clarify orders / etc ?'[]'$  - 0 ?Routine Transfer to another Facility (non-emergent condition) ?'[]'$  - 0 ?Routine Hospital Admission (non-emergent condition) ?'[]'$  - 0 ?New Admissions / Biomedical engineer / Ordering NPWT Apligraf, etc. ?, ?'[]'$  - 0 ?Emergency Hospital Admission (emergent condition) ?X- 1 10 ?Simple Discharge Coordination ?'[]'$  - 0 ?Complex (extensive) Discharge Coordination ?PROCESS - Special Needs ?'[]'$  - 0 ?Pediatric / Minor Patient Management ?'[]'$  - 0 ?Isolation Patient Management ?'[]'$  - 0 ?Hearing / Language / Visual special needs ?'[]'$  - 0 ?Assessment of Community assistance (transportation, D/C planning, etc.) ?'[]'$  - 0 ?Additional assistance / Altered  mentation ?'[]'$  - 0 ?Support Surface(s) Assessment (bed, cushion, seat, etc.) ?INTERVENTIONS - Wound Cleansing / Measurement ?X - Simple Wound Cleansing - one wound 1 5 ?'[]'$  - 0 ?Complex Wound  Cleansing - multiple wounds ?X- 1 5 ?Wound Imaging (photographs - any number of wounds) ?'[]'$  - 0 ?Wound Tracing (instead of photographs) ?X- 1 5 ?Simple Wound Measurement - one wound ?'[]'$  - 0 ?Complex Wound Measurement - multiple wounds ?INTERVENTIONS - Wound Dressings ?X - Small Wound Dressing one or multiple wounds 1 10 ?'[]'$  - 0 ?Medium Wound Dressing one or multiple wounds ?'[]'$  - 0 ?Large Wound Dressing one or multiple wounds ?'[]'$  - 0 ?Application of Medications - topical ?'[]'$  - 0 ?Application of Medications - injection ?INTERVENTIONS - Miscellaneous ?'[]'$  - 0 ?External ear exam ?'[]'$  - 0 ?Specimen Collection (cultures, biopsies, blood, body fluids, etc.) ?'[]'$  - 0 ?Specimen(s) / Culture(s) sent or taken to Lab for analysis ?'[]'$  - 0 ?Patient Transfer (multiple staff / Civil Service fast streamer / Similar devices) ?'[]'$  - 0 ?Simple Staple / Suture removal (25 or less) ?'[]'$  - 0 ?Complex Staple / Suture removal (26 or more) ?'[]'$  - 0 ?Hypo / Hyperglycemic Management (close monitor of Blood Glucose) ?'[]'$  - 0 ?Ankle / Brachial Index (ABI) - do not check if billed separately ?X- 1 5 ?Vital Signs ?Has the patient been seen at the hospital within the last three years: Yes ?Total Score: 90 ?Level Of Care: New/Established - Level 3 ?Electronic Signature(s) ?Signed: 10/17/2021 12:49:17 PM By: Rhae Hammock RN ?Entered By: Rhae Hammock on 10/17/2021 11:58:48 ?-------------------------------------------------------------------------------- ?Encounter Discharge Information Details ?Patient Name: Date of Service: ?RANVIR, RENOVATO 10/16/2021 1:15 PM ?Medical Record Number: [id number] ?Patient Account Number: [id number] ?Date of Birth/Sex: Treating RN: ?18-Oct-1955 (66 y.o. [name], Lauren ?Primary Care Kenyatta Keidel: Carolee Rota Other Clinician: ?Referring  Laderrick Wilk: ?Treating Darrelyn Morro/Extender: Kalman Shan ?Carolee Rota ?Weeks in Treatment: 32 ?Encounter Discharge Information Items ?Discharge Condition: Stable ?Ambulatory Status: Ambulatory ?Discharge Destination: Home ?Transportation: Private Auto ?Accompanied By: self ?Schedule Follow-up Appointment: Yes ?Clinical Summary of Care: Patient Declined ?Electronic Signature(s) ?Signed: 10/17/2021 12:49:17 PM By: Rhae Hammock RN ?Entered By: Rhae Hammock on 10/17/2021 11:59:34 ?-------------------------------------------------------------------------------- ?Lower Extremity Assessment Details ?Patient Name: Date of Service: ?NEHEMYAH, FOUSHEE 10/16/2021 1:15 PM ?Medical Record Number: [id number] ?Patient Account Number: [id number] ?Date of Birth/Sex: Treating RN: ?1955-12-23 (66 y.o. [name], Lauren ?Primary Care Bennie Scaff: Carolee Rota Other Clinician: ?Referring Elion Hocker: ?Treating Mehar Kirkwood/Extender: Kalman Shan ?Carolee Rota ?Weeks in Treatment: 32 ?Edema Assessment ?Assessed: [Left: Yes] [Right: No] ?Edema: [Left: N] [Right: o] ?Calf ?Left: Right: ?Point of Measurement: 37 cm From Medial Instep 34 cm ?Ankle ?Left: Right: ?Point of Measurement: 8 cm From Medial Instep 23 cm ?Vascular Assessment ?Pulses: ?Dorsalis Pedis ?Palpable: [Left:Yes] ?Posterior Tibial ?Palpable: [Left:Yes] ?Electronic Signature(s) ?Signed: 10/17/2021 12:49:17 PM By: Rhae Hammock RN ?Entered By: Rhae Hammock on 10/16/2021 13:52:48 ?-------------------------------------------------------------------------------- ?Multi Wound Chart Details ?Patient Name: ?Date of Service: ?BRETTON, TANDY 10/16/2021 1:15 PM ?Medical Record Number: [id number] ?Patient Account Number: [id number] ?Date of Birth/Sex: ?Treating RN: ?11-23-1955 (66 y.o. [name], Lauren ?Primary Care Torrell Krutz: Carolee Rota ?Other Clinician: ?Referring Rio Kidane: ?Treating Valeree Leidy/Extender: Kalman Shan ?Carolee Rota ?Weeks in Treatment:  32 ?Vital Signs ?Height(in): 69 ?Capillary Blood Glucose(mg/dl): 300 ?Weight(lbs): 215 ?Pulse(bpm): 90 ?Body Mass Index(BMI): 31.7 ?Blood Pressure(mmHg): 163/81 ?Temperature(??F): 97.5 ?Respiratory Rate(breaths/min): 17 ?P

## 2021-10-17 NOTE — Progress Notes (Signed)
Kevin Oisin W. ([id number]) ?, ?Visit Report for 10/16/2021 ?Chief Complaint Document Details ?Patient Name: Date of Service: ?[name], [name] 10/16/2021 1:15 PM ?Medical Record Number: [id number] ?Patient Account Number: [id number] ?Date of Birth/Sex: Treating RN: ?01-22-1956 (66 y.o. [name], [name] ?Primary Care Provider: Carolee [name] Other Clinician: ?Referring Provider: ?Treating Provider/Extender: [name] ?[name] ?Weeks in Treatment: 32 ?Information Obtained from: Patient ?Chief Complaint ?Nonhealing wound secondary to Wound dehiscence status post transmetatarsal amputation on the left ?Electronic Signature(s) ?Signed: 10/16/2021 2:21:23 PM By: Kevin Shan DO ?Entered By: [name] on 10/16/2021 14:18:25 ?-------------------------------------------------------------------------------- ?HPI Details ?Patient Name: Date of Service: ?[name], [name] 10/16/2021 1:15 PM ?Medical Record Number: [id number] ?Patient Account Number: [id number] ?Date of Birth/Sex: Treating RN: ?Dec 23, 1955 (66 y.o. [name], [name] ?Primary Care Provider: Carolee [name] Other Clinician: ?Referring Provider: ?Treating Provider/Extender: [name] ?[name] ?Weeks in Treatment: 32 ?History of Present Illness ?HPI Description: Admission 8/18 ?Mr. Salomon Ganser is a 66 year old male with a past medical history of uncontrolled insulin-dependent type 2 diabetes, COPD On O2 6 L via nasal cannula That ?presents to the clinic for a 48-monthhistory of nonhealing wound to the left great toe amputation site. He has been following with podiatry for a wound to his left ?great toe that developed osteomyelitis and subsequently required amputation. He had the surgery done on 6/10. He had a partial first ray amputation by Dr. ?Patel. Since his surgery he has been placing iodine on the wound bed. He states that there has been no improvement to the wound healing in the past 2 ?months. He states he has occasional  intermittent pain to the area. He denies systemic signs of infection. ?8/25; patient presents for 1 week follow-up. He states he has been using silver alginate daily without issues. He has no complaints today. He denies signs of ?infection. He is scheduled to have his MRI on 8/27. ?9/15; patient presents for follow-up. He missed his last clinic appointment due to feeling ill. He continues to use silver alginate daily without any issues. He ?reports improvement in wound healing. He currently denies signs of infection. ?9/27; patient presents for follow-up. He has developed a new wound to the second left toe. He is unaware of this and was noted on exam today. He continues ?to use silver alginate to the amputation site wound. He has no issues or complaints today. He currently denies signs of infection. ?10/4; patient presents for follow-up. He is using silver alginate to the wound sites. He has no issues or complaints today. He denies systemic signs of ?infection. ?10/10; patient presents for follow-up. He has been using silver alginate to the wound sites. He states that his foot became more swollen over the weekend, But ?has since improved. He overall feels okay. ?Admission 06/02/2021 ?Patient presents for nonhealing wound secondary to wound dehiscence following a transmetatarsal amputation. He states he has been using Betadine wet-to- ?dry dressings to the wound bed. He states the wound has been open for the past 3 weeks. He currently denies Pain or signs of infection. ?11/21; patient presents for follow-up. He has home health but states this will end in 2 weeks per insurance. He has no issues or complaints today. He denies ?signs of infection. ?11/28; patient presents for follow-up. He states that home health will be stopped at the end of the week. He has no issues or complaints today. ?12/12; patient presents for follow-up. He states he saw Dr. PPosey Prontoon 11/30 and was prescribed doxycycline for cellulitis. He  currently reports more erythema ?and pain over the past week. He states his sugars have been very high and in the 400s. His family encouraged him to go to the ED over the weekend ?however he declined. ?1/20; patient presents for follow-up. He reports having an arteriogram with stenting placed to the left lower extremity. He has been using Hydrofera Blue to the ?wound bed with improvement in wound healing. He denies signs of infection. ?2/3; patient presents for follow-up. He has been using Hydrofera Blue to the wound beds without issues. He reports improvement in wound healing. He denies ?signs of infection. ?2/17; patient presents for follow-up. He has been using Hydrofera Blue to the wound beds without issues. He reports continued improvement in wound healing. ?He denies signs of infection. ?3/3; patient presents for follow-up. He has been using Hydrofera Blue to the wound beds. Home health has discharged the patient. He currently denies signs of ?infection. ?3/17; patient presents for follow-up. He has been using gentamicin ointment to the wound bed. He has no issues or complaints today. ?3/23; patient presents for follow-up. He has been using collagen. He has no issues or complaints today. He reports improvement in wound healing. ?3/30; patient presents for follow-up. He has been using antibiotic ointment to the wound site. He has been keeping the area covered with a Band-Aid and reports ?that the area has had no drainage for the past few days. He denies signs of infection. ?Electronic Signature(s) ?Signed: 10/16/2021 2:21:23 PM By: Kevin Shan DO ?Entered By: [name] on 10/16/2021 14:19:12 ?-------------------------------------------------------------------------------- ?Physical Exam Details ?Patient Name: Date of Service: ?[name], [name] 10/16/2021 1:15 PM ?Medical Record Number: [id number] ?Patient Account Number: [id number] ?Date of Birth/Sex: Treating RN: ?[birth year] (66 y.o. [name],  [name] ?Primary Care Provider: Carolee [name] Other Clinician: ?Referring Provider: ?Treating Provider/Extender: [name] ?[name] ?Weeks in Treatment: 32 ?Constitutional ?respirations regular, non-labored and within target range for patient.[name] ?Cardiovascular ?2+ dorsalis pedis/posterior tibialis pulses. ?Psychiatric ?pleasant and cooperative. ?Notes ?Left foot: Transmetatarsal amputation. There is epithelization throughout the incision site. No drainage noted. ?Electronic Signature(s) ?Signed: 10/16/2021 2:21:23 PM By: Kevin Shan DO ?Entered By: [name] on 10/16/2021 14:19:46 ?-------------------------------------------------------------------------------- ?Physician Orders Details ?Patient Name: Date of Service: ?[name], [name] 10/16/2021 1:15 PM ?Medical Record Number: [id number] ?Patient Account Number: [id number] ?Date of Birth/Sex: Treating RN: ?Jan 14, 1956 (66 y.o. [name], [name] ?Primary Care Provider: Carolee [name] Other Clinician: ?Referring Provider: ?Treating Provider/Extender: [name] ?[name] ?Weeks in Treatment: 32 ?Verbal / Phone Orders: No ?Diagnosis Coding ?Discharge From [facility] ?Discharge from [facility] ?Electronic Signature(s) ?Signed: 10/16/2021 2:21:23 PM By: Kevin Shan DO ?Entered By: [name] on 10/16/2021 14:20:07 ?-------------------------------------------------------------------------------- ?Problem List Details ?Patient Name: ?Date of Service: ?[name], [name] 10/16/2021 1:15 PM ?Medical Record Number: [id number] ?Patient Account Number: [id number] ?Date of Birth/Sex: ?Treating RN: ?1955/11/12 (66 y.o. [name], [name] ?Primary Care Provider: Carolee [name] ?Other Clinician: ?Referring Provider: ?Treating Provider/Extender: [name] ?[name] ?Weeks in Treatment: 32 ?Active Problems ?ICD-10 ?Encounter ?Code Description Active Date MDM ?Diagnosis ?L97.528 Non-pressure chronic ulcer of other part  of left foot with other specified 06/02/2021 No Yes ?severity ?T81.31XD Disruption of external operation (surgical) wound, not elsewhere classified, 06/02/2021 No Yes ?subsequent encounter ?E11.621 Type 2 diabe

## 2021-10-23 ENCOUNTER — Other Ambulatory Visit: Payer: Self-pay

## 2021-10-23 ENCOUNTER — Observation Stay (HOSPITAL_COMMUNITY): Payer: Medicare Other

## 2021-10-23 ENCOUNTER — Encounter (HOSPITAL_COMMUNITY): Payer: Self-pay

## 2021-10-23 ENCOUNTER — Emergency Department (HOSPITAL_COMMUNITY): Payer: Medicare Other

## 2021-10-23 ENCOUNTER — Observation Stay (HOSPITAL_COMMUNITY)
Admission: EM | Admit: 2021-10-23 | Discharge: 2021-10-24 | Disposition: A | Discharge status: Home / Self Care | Payer: Medicare Other | Attending: Internal Medicine | Admitting: Internal Medicine

## 2021-10-23 DIAGNOSIS — F17210 Nicotine dependence, cigarettes, uncomplicated: Secondary | ICD-10-CM | POA: Diagnosis not present

## 2021-10-23 DIAGNOSIS — I959 Hypotension, unspecified: Secondary | ICD-10-CM | POA: Diagnosis not present

## 2021-10-23 DIAGNOSIS — E119 Type 2 diabetes mellitus without complications: Secondary | ICD-10-CM | POA: Insufficient documentation

## 2021-10-23 DIAGNOSIS — I129 Hypertensive chronic kidney disease with stage 1 through stage 4 chronic kidney disease, or unspecified chronic kidney disease: Secondary | ICD-10-CM | POA: Diagnosis not present

## 2021-10-23 DIAGNOSIS — E1140 Type 2 diabetes mellitus with diabetic neuropathy, unspecified: Secondary | ICD-10-CM | POA: Diagnosis not present

## 2021-10-23 DIAGNOSIS — Z7984 Long term (current) use of oral hypoglycemic drugs: Secondary | ICD-10-CM | POA: Insufficient documentation

## 2021-10-23 DIAGNOSIS — J449 Chronic obstructive pulmonary disease, unspecified: Secondary | ICD-10-CM | POA: Diagnosis not present

## 2021-10-23 DIAGNOSIS — R400 Somnolence: Secondary | ICD-10-CM

## 2021-10-23 DIAGNOSIS — Z7902 Long term (current) use of antithrombotics/antiplatelets: Secondary | ICD-10-CM | POA: Diagnosis not present

## 2021-10-23 DIAGNOSIS — Z20822 Contact with and (suspected) exposure to covid-19: Secondary | ICD-10-CM | POA: Diagnosis not present

## 2021-10-23 DIAGNOSIS — Z794 Long term (current) use of insulin: Secondary | ICD-10-CM | POA: Insufficient documentation

## 2021-10-23 DIAGNOSIS — I2510 Atherosclerotic heart disease of native coronary artery without angina pectoris: Secondary | ICD-10-CM | POA: Diagnosis not present

## 2021-10-23 DIAGNOSIS — J90 Pleural effusion, not elsewhere classified: Secondary | ICD-10-CM | POA: Diagnosis not present

## 2021-10-23 DIAGNOSIS — N183 Chronic kidney disease, stage 3 unspecified: Secondary | ICD-10-CM | POA: Insufficient documentation

## 2021-10-23 DIAGNOSIS — C649 Malignant neoplasm of unspecified kidney, except renal pelvis: Secondary | ICD-10-CM | POA: Diagnosis present

## 2021-10-23 DIAGNOSIS — G9340 Encephalopathy, unspecified: Secondary | ICD-10-CM

## 2021-10-23 DIAGNOSIS — J9611 Chronic respiratory failure with hypoxia: Secondary | ICD-10-CM | POA: Insufficient documentation

## 2021-10-23 DIAGNOSIS — Z85528 Personal history of other malignant neoplasm of kidney: Secondary | ICD-10-CM | POA: Diagnosis not present

## 2021-10-23 DIAGNOSIS — I700 Atherosclerosis of aorta: Secondary | ICD-10-CM | POA: Diagnosis not present

## 2021-10-23 DIAGNOSIS — E860 Dehydration: Secondary | ICD-10-CM | POA: Diagnosis not present

## 2021-10-23 DIAGNOSIS — E1169 Type 2 diabetes mellitus with other specified complication: Secondary | ICD-10-CM

## 2021-10-23 DIAGNOSIS — I517 Cardiomegaly: Secondary | ICD-10-CM | POA: Diagnosis not present

## 2021-10-23 DIAGNOSIS — R109 Unspecified abdominal pain: Secondary | ICD-10-CM | POA: Diagnosis not present

## 2021-10-23 DIAGNOSIS — J439 Emphysema, unspecified: Secondary | ICD-10-CM | POA: Diagnosis present

## 2021-10-23 DIAGNOSIS — Z8673 Personal history of transient ischemic attack (TIA), and cerebral infarction without residual deficits: Secondary | ICD-10-CM | POA: Diagnosis not present

## 2021-10-23 DIAGNOSIS — R531 Weakness: Secondary | ICD-10-CM | POA: Diagnosis not present

## 2021-10-23 DIAGNOSIS — R0602 Shortness of breath: Secondary | ICD-10-CM | POA: Diagnosis not present

## 2021-10-23 DIAGNOSIS — R197 Diarrhea, unspecified: Secondary | ICD-10-CM | POA: Diagnosis not present

## 2021-10-23 DIAGNOSIS — R6889 Other general symptoms and signs: Secondary | ICD-10-CM | POA: Diagnosis not present

## 2021-10-23 DIAGNOSIS — Z743 Need for continuous supervision: Secondary | ICD-10-CM | POA: Diagnosis not present

## 2021-10-23 HISTORY — DX: Encephalopathy, unspecified: G93.40

## 2021-10-23 LAB — URINALYSIS, ROUTINE W REFLEX MICROSCOPIC
Bilirubin Urine: NEGATIVE
Glucose, UA: NEGATIVE mg/dL
Hgb urine dipstick: NEGATIVE
Ketones, ur: NEGATIVE mg/dL
Leukocytes,Ua: NEGATIVE
Nitrite: NEGATIVE
Protein, ur: NEGATIVE mg/dL
Specific Gravity, Urine: 1.009 (ref 1.005–1.030)
pH: 5 (ref 5.0–8.0)

## 2021-10-23 LAB — CBC
HCT: 40 % (ref 39.0–52.0)
Hemoglobin: 12.8 g/dL — ABNORMAL LOW (ref 13.0–17.0)
MCH: 27.1 pg (ref 26.0–34.0)
MCHC: 32 g/dL (ref 30.0–36.0)
MCV: 84.6 fL (ref 80.0–100.0)
Platelets: 257 10*3/uL (ref 150–400)
RBC: 4.73 MIL/uL (ref 4.22–5.81)
RDW: 14.5 % (ref 11.5–15.5)
WBC: 9.4 10*3/uL (ref 4.0–10.5)
nRBC: 0 % (ref 0.0–0.2)

## 2021-10-23 LAB — I-STAT VENOUS BLOOD GAS, ED
Acid-Base Excess: 0 mmol/L (ref 0.0–2.0)
Bicarbonate: 25.1 mmol/L (ref 20.0–28.0)
Calcium, Ion: 1.16 mmol/L (ref 1.15–1.40)
HCT: 38 % — ABNORMAL LOW (ref 39.0–52.0)
Hemoglobin: 12.9 g/dL — ABNORMAL LOW (ref 13.0–17.0)
O2 Saturation: 99 %
Potassium: 4.1 mmol/L (ref 3.5–5.1)
Sodium: 139 mmol/L (ref 135–145)
TCO2: 26 mmol/L (ref 22–32)
pCO2, Ven: 43.4 mmHg — ABNORMAL LOW (ref 44–60)
pH, Ven: 7.371 (ref 7.25–7.43)
pO2, Ven: 145 mmHg — ABNORMAL HIGH (ref 32–45)

## 2021-10-23 LAB — AMMONIA: Ammonia: 38 umol/L — ABNORMAL HIGH (ref 9–35)

## 2021-10-23 LAB — COMPREHENSIVE METABOLIC PANEL
ALT: 10 U/L (ref 0–44)
AST: 15 U/L (ref 15–41)
Albumin: 3.3 g/dL — ABNORMAL LOW (ref 3.5–5.0)
Alkaline Phosphatase: 60 U/L (ref 38–126)
Anion gap: 6 (ref 5–15)
BUN: 28 mg/dL — ABNORMAL HIGH (ref 8–23)
CO2: 23 mmol/L (ref 22–32)
Calcium: 8.9 mg/dL (ref 8.9–10.3)
Chloride: 108 mmol/L (ref 98–111)
Creatinine, Ser: 1.82 mg/dL — ABNORMAL HIGH (ref 0.61–1.24)
GFR, Estimated: 40 mL/min — ABNORMAL LOW (ref >60)
Glucose, Bld: 70 mg/dL (ref 70–99)
Potassium: 4 mmol/L (ref 3.5–5.1)
Sodium: 137 mmol/L (ref 135–145)
Total Bilirubin: 0.6 mg/dL (ref 0.3–1.2)
Total Protein: 6.9 g/dL (ref 6.5–8.1)

## 2021-10-23 LAB — RESP PANEL BY RT-PCR (FLU A&B, COVID) ARPGX2
Influenza A by PCR: NEGATIVE
Influenza B by PCR: NEGATIVE
SARS Coronavirus 2 by RT PCR: NEGATIVE

## 2021-10-23 LAB — LIPASE, BLOOD: Lipase: 40 U/L (ref 11–51)

## 2021-10-23 LAB — TROPONIN I (HIGH SENSITIVITY)
Troponin I (High Sensitivity): 8 ng/L (ref <18)
Troponin I (High Sensitivity): 8 ng/L (ref <18)

## 2021-10-23 LAB — LACTIC ACID, PLASMA: Lactic Acid, Venous: 1.1 mmol/L (ref 0.5–1.9)

## 2021-10-23 MED ORDER — INSULIN ASPART 100 UNIT/ML IJ SOLN: 0.0000 [IU] | Freq: Three times a day (TID) | INTRAMUSCULAR | Status: DC

## 2021-10-23 MED ORDER — BUDESONIDE 0.5 MG/2ML IN SUSP: 1.0000 mg | Freq: Four times a day (QID) | RESPIRATORY_TRACT | Status: DC | PRN

## 2021-10-23 MED ORDER — ARFORMOTEROL TARTRATE 15 MCG/2ML IN NEBU
15.0000 ug | INHALATION_SOLUTION | Freq: Two times a day (BID) | RESPIRATORY_TRACT | Status: DC
  Administered 2021-10-24: 15 ug via RESPIRATORY_TRACT
  Filled 2021-10-23: qty 2

## 2021-10-23 MED ORDER — LACTULOSE 10 GM/15ML PO SOLN: 10.0000 g | Freq: Once | ORAL | Status: DC

## 2021-10-23 MED ORDER — HYDRALAZINE HCL 20 MG/ML IJ SOLN: 10.0000 mg | INTRAMUSCULAR | Status: DC | PRN

## 2021-10-23 MED ORDER — SODIUM CHLORIDE 0.9 % IV BOLUS
1000.0000 mL | Freq: Once | INTRAVENOUS | Status: AC
  Administered 2021-10-23: 1000 mL via INTRAVENOUS

## 2021-10-23 MED ORDER — ACETAMINOPHEN 325 MG PO TABS: 650.0000 mg | ORAL_TABLET | Freq: Four times a day (QID) | ORAL | Status: DC | PRN

## 2021-10-23 MED ORDER — INSULIN REGULAR HUMAN (CONC) 500 UNIT/ML ~~LOC~~ SOLN
90.0000 [IU] | Freq: Three times a day (TID) | SUBCUTANEOUS | Status: DC
  Filled 2021-10-23: qty 20

## 2021-10-23 MED ORDER — CLOPIDOGREL BISULFATE 75 MG PO TABS
75.0000 mg | ORAL_TABLET | Freq: Every day | ORAL | Status: DC
  Administered 2021-10-24: 75 mg via ORAL
  Filled 2021-10-23: qty 1

## 2021-10-23 MED ORDER — ACETAMINOPHEN 650 MG RE SUPP: 650.0000 mg | Freq: Four times a day (QID) | RECTAL | Status: DC | PRN

## 2021-10-23 MED ORDER — ENOXAPARIN SODIUM 40 MG/0.4ML IJ SOSY
40.0000 mg | PREFILLED_SYRINGE | Freq: Every day | INTRAMUSCULAR | Status: DC
  Administered 2021-10-24: 40 mg via SUBCUTANEOUS
  Filled 2021-10-23: qty 0.4

## 2021-10-23 MED ORDER — IOHEXOL 300 MG/ML  SOLN
80.0000 mL | Freq: Once | INTRAMUSCULAR | Status: AC | PRN
  Administered 2021-10-23: 80 mL via INTRAVENOUS

## 2021-10-23 NOTE — ED Notes (Signed)
Pt transported to CT ?

## 2021-10-23 NOTE — H&P (Signed)
?History and Physical  ? ? [name] LFY:[id number] DOB: 02-07-1956 DOA: 10/23/2021 ? ?PCP: Carolee Rota, NP  ?Patient coming from: Home. ? ?Chief Complaint: Increasing lethargy low blood pressure.  Diarrhea. ? ?HPI: [name] is a 66 y.o. male with history of CAD status post PCI, peripheral vascular disease, recent left foot transmetatarsal amputation for osteomyelitis, COPD, diabetes mellitus on large dose of insulin has been experiencing diarrhea for the last 1 week which has been persistent and severe watery with some abdominal discomfort around the periumbilical area where patient has a hernia.  Denies any vomiting.  Patient states he has been taking his medicines.  Had gone to his PCP where patient was found to be hypotensive with systolic in the 85I and was transferred to ER.  Patient denies any chest pain or shortness of breath or productive cough.  Denies any recent travel sick contacts fever or chills.  Denies using any recent antibiotics.  Last time he used antibiotics was when he had a wound infection of the left transmetatarsal about 4 months ago. ? ?ED Course: In the ER patient's lab work show most of them at baseline.  Patient appears lethargic.  CT head and CT abdomen does not show anything acute.  Since patient remain lethargic VBG was done which shows PCO2 of 43.  Ammonia level is around 38 which is slightly higher than normal and was given lactulose.  High sensitive troponins were negative.  Lactic acid was normal.  Patient admitted for further observation for acute encephalopathy-lethargy and also patient was documented to have hypotension at the PCPs office.  Patient also has continuous diarrhea. ? ?Review of Systems: As per HPI, rest all negative. ? ? ?Past Medical History:  ?Diagnosis Date  ? Anxiety   ? Arthritis   ? Cholecystitis, acute 12/20/2013  ? Lap chole 6/5  ? Cocaine abuse ([place])   ? COPD (chronic obstructive pulmonary disease) ([place])   ? Coronary atherosclerosis of native  coronary artery   ? a. 03/09/2013 Cath/PCI: LM nl, LAD: 50p, 62m(2.5x16 promus DES), LCX nl, OM1 min irregs, LPL/LPDA diff dzs, RCA nondom, mod diff dzs, EF 55%.  ? Depression   ? Essential hypertension   ? GERD (gastroesophageal reflux disease)   ? Headache   ? Hepatitis Late 1970s  ? History of complete ray am[place] of first [place] of [place] foot [place] 04/24[place]  ? History of nephrolithiasis   ? History of pneumonia   ? History of stroke   ? Right MCA distribution, residual left-sided weakness  ? Hyperlipidemia   ? Noncompliance   ? Osteomyelitis of second toe of left foot ([place] 04/24/2021  ? Renal cell carcinoma ([place]   ? Status post radical right nephrectomy August 2015  ? Sleep apnea   ? On CPAP, 4L O2 no cpap at home yet  ? Type 2 diabetes mellitus ([place] 2011  ? ? ?Past Surgical History:  ?Procedure Laterality Date  ? ABDOMINAL AORTOGRAM W/LOWER EXTREMITY Left 07/17/2021  ? Procedure: ABDOMINAL AORTOGRAM W/LOWER EXTREMITY;  Surgeon: BSerafina Mitchell MD;  [facility];  Service: Cardiovascular;  Laterality: Left;  ? AMPUTATION Left 12/27/2020  ? Procedure: PARTIAL FIRST RAY AMPUTATION OF FOOT;  Surgeon: PFelipa Furnace DPM;  [place]  Service: Podiatry;  Laterality: Left;  ? APPENDECTOMY  1970's  ? CHOLECYSTECTOMY N/A 12/22/2013  ? Procedure: LAPAROSCOPIC CHOLECYSTECTOMY ;  Surgeon: DHarl Bowie MD;  [place]  Service: General;  Laterality: N/A;  ?  ENDARTERECTOMY Right 10/08/2014  ? Procedure: Right ENDARTERECTOMY CAROTID;  Surgeon: [name], MD;  Location: [place];  Service: Vascular;  Laterality: Right;  ? LAPAROSCOPIC LYSIS OF ADHESIONS  02/21/2014  ? Procedure: LAPAROSCOPIC LYSIS OF ADHESIONS EXTINSIVE;  Surgeon: [name], MD;  [facility];  Service: Urology;;  ? LEFT HEART CATHETERIZATION WITH CORONARY ANGIOGRAM N/A 06/02/2012  ? Procedure: LEFT HEART CATHETERIZATION WITH CORONARY ANGIOGRAM;  Surgeon: [name], MD;  Location: [facility] CATH LAB;  Service:  Cardiovascular;  Laterality: N/A;  ? LEFT HEART CATHETERIZATION WITH CORONARY ANGIOGRAM N/A 03/09/2013  ? Procedure: LEFT HEART CATHETERIZATION WITH CORONARY ANGIOGRAM;  Surgeon: [name], MD;  Location: [facility] CATH LAB;  Service: Cardiovascular;  Laterality: N/A;  ? LEFT HEART CATHETERIZATION WITH CORONARY ANGIOGRAM N/A 12/20/2013  ? Procedure: LEFT HEART CATHETERIZATION WITH CORONARY ANGIOGRAM;  Surgeon: [name], MD;  Location: [facility] CATH LAB;  Service: Cardiovascular;  Laterality: N/A;  ? LUNG BIOPSY Left 05/18/2014  ? Procedure: LUNG BIOPSY left upper lobe & left lower lobe;  Surgeon: [name], MD;  Location: [place];  Service: Thoracic;  Laterality: Left;  ? PATCH ANGIOPLASTY Right 10/08/2014  ? Procedure: PATCH ANGIOPLASTY Right Carotid;  Surgeon: [name], MD;  Location: [place];  Service: Vascular;  Laterality: Right;  ? PERIPHERAL VASCULAR INTERVENTION Left 07/17/2021  ? Procedure: PERIPHERAL VASCULAR INTERVENTION;  Surgeon: Serafina Mitchell, MD;  Location: [place] [facility];  Service: Cardiovascular;  Laterality: Left;  Superficial Femoral Artery  ? ROBOT ASSISTED LAPAROSCOPIC NEPHRECTOMY Right 02/21/2014  ? Procedure: ROBOTIC ASSISTED LAPAROSCOPIC RIGHT NEPHRECTOMY ;  Surgeon: [name], MD;  [facility];  Service: Urology;  Laterality: Right;  ? VIDEO ASSISTED THORACOSCOPY Left 05/18/2014  ? Procedure: LEFT VIDEO ASSISTED THORACOSCOPY;  Surgeon: [name], MD;  Location: [place];  Service: Thoracic;  Laterality: Left;  ? VIDEO BRONCHOSCOPY  05/18/2014  ? Procedure: VIDEO BRONCHOSCOPY;  Surgeon: [name], MD;  Location: [place];  Service: Thoracic;;  ? ? ? reports that he has been smoking cigarettes. He started smoking about 49 years ago. He has a 41.00 pack-year smoking history. He has never been exposed to tobacco smoke. He has never used smokeless tobacco. He reports that he does not drink alcohol and does not use drugs. ? ?Allergies  ?Allergen  Reactions  ? Lisinopril Other (See Comments)  ?  Other reaction(s): worsening kidney function  ? Pollen Extract   ?  Other reaction(s): Unknown  ? Oxycodone Itching and Nausea Only  ?  Pt is able to tolerate if taken with benadryl  ? ? ?Family History  ?Problem Relation Age of Onset  ? Cancer Mother   ?     Thyroid - living in her 15's.  ? Hypertension Other   ? Diabetes Other   ? Stroke Other   ? Lung cancer Father   ? CAD Father   ? Cancer Maternal Grandmother   ?     Breast  ? Cancer Maternal Grandfather   ?     Throat and stomach  ? CAD Brother   ? ? ?Prior to Admission medications   ?Medication Sig Start Date End Date Taking? Authorizing Provider  ?acetaminophen (TYLENOL) 325 MG tablet Take 650 mg by mouth as needed (pain). 06/03/18   [provider]  ?ALPRAZolam Duanne Moron) 0.5 MG tablet Take 1 tablet (0.5 mg total) by mouth 3 (three) times daily as needed for anxiety. 06/03/18   Louellen Molder, MD  ?  ALPRAZolam (XANAX) 1 MG tablet Take 1 mg by mouth 3 (three) times daily as needed. 01/07/21   [provider]  ?atorvastatin (LIPITOR) 20 MG tablet Take 20 mg by mouth at bedtime. 04/26/18   [provider]  ?azithromycin (ZITHROMAX) 250 MG tablet Take 2 tablets first day then 1 tablet daily until gone 06/05/21   Brand Males, MD  ?Budeson-Glycopyrrol-Formoterol (BREZTRI AEROSPHERE) 160-9-4.8 MCG/ACT AERO Inhale 2 puffs into the lungs in the morning and at bedtime. 02/26/21   Brand Males, MD  ?budesonide (PULMICORT) 1 MG/2ML nebulizer solution Take 1 mg by nebulization as needed (shortness of breath). 01/16/20   [provider]  ?clopidogrel (PLAVIX) 75 MG tablet TAKE ONE TABLET BY MOUTH EVERY DAY ?Patient taking differently: Take 75 mg by mouth daily. 12/30/20   Satira Sark, MD  ?diltiazem (CARDIZEM CD) 180 MG 24 hr capsule TAKE ONE CAPSULE BY MOUTH EVERY DAY ?Patient taking differently: Take 180 mg by mouth daily. 12/30/20   Satira Sark, MD  ?diphenhydrAMINE  (BENADRYL) 25 mg capsule Take 25 mg by mouth every 6 (six) hours as needed for itching (take with percocet).    [provider]  ?esomeprazole (NEXIUM) 40 MG capsule Take 40 mg by mouth at bedtime. Pt only take

## 2021-10-23 NOTE — ED Triage Notes (Incomplete)
[facility] from Dr. Gabriel Carina. Pt seen for n/v/d, gen weakness since last Thursday. Hypotensive at dr 84/60. Pt received 250 NS via EMS. ?

## 2021-10-23 NOTE — ED Provider Notes (Signed)
? ?Emergency Department Provider Note ? ? ?I have reviewed the triage vital signs and the nursing notes. ? ? ?HISTORY ? ?Chief Complaint ?Diarrhea ? ? ?HPI ?[name] is a 66 y.o. male past medical history reviewed below including COPD, known umbilical hernia, hypertension, hyperlipidemia, and DM presents to the emergency department from his PCP office with report of decreased mentation, report of diarrhea, and abdominal discomfort.  Patient reports having some pain near his hernia and tells me that seems larger than normal.  He states that occurred 1 week ago.  The PCP documentation, at bedside, reports that he arrived there with a friend and seemed altered. Patient denies any EtOH or drugs. No new medications. No recent abx or other medication change. Patient reports foot infection in December with amputation at that time. Denies any wound issues or fever.  ? ? ?Past Medical History:  ?Diagnosis Date  ? Anxiety   ? Arthritis   ? Cholecystitis, acute 12/20/2013  ? Lap chole 6/5  ? Cocaine abuse ([place])   ? COPD (chronic obstructive pulmonary disease) ([place])   ? Coronary atherosclerosis of native coronary artery   ? a. 03/09/2013 Cath/PCI: LM nl, LAD: 50p, 57m(2.5x16 promus DES), LCX nl, OM1 min irregs, LPL/LPDA diff dzs, RCA nondom, mod diff dzs, EF 55%.  ? Depression   ? Essential hypertension   ? GERD (gastroesophageal reflux disease)   ? Headache   ? Hepatitis Late 1970s  ? History of complete ray amputation of first toe of left foot ([place] 04/24/2021  ? History of nephrolithiasis   ? History of pneumonia   ? History of stroke   ? Right MCA distribution, residual left-sided weakness  ? Hyperlipidemia   ? Noncompliance   ? Osteomyelitis of second toe of left foot ([place] 04/24/2021  ? [place]al cell carcinoma [place]   ? Status post radical right nephrectomy August 2015  ? Sleep apnea   ? On CPAP, 4L O2 no cpap at home yet  ? Type 2 diabetes mellitus ([place] 2011  ? ? ?Review of Systems ? ?Constitutional: No  fever/chills ?Eyes: No visual changes. ?ENT: No sore throat. ?Cardiovascular: Denies chest pain. ?Respiratory: Denies shortness of breath. ?Gastrointestinal: Positive abdominal pain.  No nausea, no vomiting.  Positive diarrhea.  No constipation. ?Genitourinary: Negative for dysuria. ?Musculoskeletal: Negative for back pain. ?Skin: Negative for rash. ?Neurological: Negative for headaches, focal weakness or numbness. ? ? ?____________________________________________ ? ? ?PHYSICAL EXAM: ? ?VITAL SIGNS: ?ED Triage Vitals  ?Enc Vitals Group  ?   BP 10/23/21 1622 118/68  ?   Pulse Rate 10/23/21 1622 78  ?   Resp 10/23/21 1622 (!) 21  ?   Temp 10/23/21 1622 98.1 ?F (36.7 ?C)  ?   Temp src --   ?   SpO2 10/23/21 1622 94 %  ?   Weight 10/23/21 1633 219 lb 5.7 oz (99.5 kg)  ?   Height 10/23/21 1633 '5\' 8"'$  (1.727 m)  ? ?Constitutional: Somnolent but able to provide a history.  ?Eyes: Conjunctivae are normal. PERRL.  ?Head: Atraumatic. ?Nose: No congestion/rhinnorhea. ?Mouth/Throat: Mucous membranes are moist.   ?Neck: No stridor.   ?Cardiovascular: Normal rate, regular rhythm. Good peripheral circulation. Grossly normal heart sounds.   ?Respiratory: Normal respiratory effort.  No retractions. Lungs CTAB. ?Gastrointestinal: Soft with periumbilical hernia which is soft and reducible. No signs of incarceration.  ?Musculoskeletal: No gross deformities of extremities. ?Neurologic:  Normal speech and language.  ?Skin:  Skin is warm, dry and  intact. No rash noted. ? ? ?____________________________________________ ?  ?LABS ?(all labs ordered are listed, but only abnormal results are displayed) ? ?Labs Reviewed  ?CBC - Abnormal; Notable for the following components:  ?    Result Value  ? Hemoglobin 12.8 (*)   ? All other components within normal limits  ?AMMONIA - Abnormal; Notable for the following components:  ? Ammonia 38 (*)   ? All other components within normal limits  ?COMPREHENSIVE METABOLIC PANEL - Abnormal; Notable for the  following components:  ? BUN 28 (*)   ? Creatinine, Ser 1.82 (*)   ? Albumin 3.3 (*)   ? GFR, Estimated 40 (*)   ? All other components within normal limits  ?CBC - Abnormal; Notable for the following components:  ? Hemoglobin 12.9 (*)   ? All other components within normal limits  ?COMPREHENSIVE METABOLIC PANEL - Abnormal; Notable for the following components:  ? Glucose, Bld 155 (*)   ? BUN 25 (*)   ? Creatinine, Ser 1.62 (*)   ? Albumin 3.2 (*)   ? AST 11 (*)   ? GFR, Estimated 47 (*)   ? All other components within normal limits  ?I-STAT VENOUS BLOOD GAS, ED - Abnormal; Notable for the following components:  ? pCO2, Ven 43.4 (*)   ? pO2, Ven 145 (*)   ? HCT 38.0 (*)   ? Hemoglobin 12.9 (*)   ? All other components within normal limits  ?CBG MONITORING, ED - Abnormal; Notable for the following components:  ? Glucose-Capillary 185 (*)   ? All other components within normal limits  ?CBG MONITORING, ED - Abnormal; Notable for the following components:  ? Glucose-Capillary 172 (*)   ? All other components within normal limits  ?RESP PANEL BY RT-PCR (FLU A&B, COVID) ARPGX2  ?C DIFFICILE QUICK SCREEN W PCR REFLEX    ?GASTROINTESTINAL PANEL BY PCR, STOOL (REPLACES STOOL CULTURE)  ?URINALYSIS, ROUTINE W REFLEX MICROSCOPIC  ?LACTIC ACID, PLASMA  ?LIPASE, BLOOD  ?AMMONIA  ?RAPID URINE DRUG SCREEN, HOSP PERFORMED  ?TROPONIN I (HIGH SENSITIVITY)  ?TROPONIN I (HIGH SENSITIVITY)  ? ?____________________________________________ ? ?EKG ? ? EKG Interpretation ? ?Date/Time:  Thursday October 23 2021 16:26:15 EDT ?Ventricular Rate:  79 ?PR Interval:  220 ?QRS Duration: 101 ?QT Interval:  367 ?QTC Calculation: 421 ?R Axis:   -40 ?Text Interpretation: Sinus rhythm Atrial premature complex Prolonged PR interval Left axis deviation Abnormal R-wave progression, late transition Borderline repolarization abnormality Borderline ST elevation, lateral leads St changes similar to Dec 2022 tracing Confirmed by Nanda Quinton [phone]) on 10/23/2021  6:12:46 PM ?  ? ?  ? ? ?____________________________________________ ? ?RADIOLOGY ? ?CT Head Wo Contrast ? ?Result Date: 10/23/2021 ?CLINICAL DATA:  Mental status change, unknown cause EXAM: CT HEAD WITHOUT CONTRAST TECHNIQUE: Contiguous axial images were obtained from the base of the skull through the vertex without intravenous contrast. RADIATION DOSE REDUCTION: This exam was performed according to the departmental dose-optimization program which includes automated exposure control, adjustment of the mA and/or kV according to patient size and/or use of iterative reconstruction technique. COMPARISON:  05/19/2019 head CT. FINDINGS: Brain: Prominent chronic encephalomalacia in the lateral right frontal lobe, unchanged. No evidence of parenchymal hemorrhage or extra-axial fluid collection. No mass lesion, mass effect, or midline shift. No CT evidence of acute infarction. Cerebral volume is age appropriate. No ventriculomegaly. Vascular: No acute abnormality. Skull: No evidence of calvarial fracture. Sinuses/Orbits: The visualized paranasal sinuses are essentially clear. Other:  The mastoid air cells  are unopacified. IMPRESSION: 1. No evidence of acute intracranial abnormality. 2. Prominent chronic encephalomalacia in the lateral right frontal lobe. Electronically Signed   By: Ilona Sorrel M.D.   On: 10/23/2021 20:21  ? ?CT ABDOMEN PELVIS W CONTRAST ? ?Result Date: 10/23/2021 ?CLINICAL DATA:  Acute abdominal pain.  Mental status change. EXAM: CT ABDOMEN AND PELVIS WITH CONTRAST TECHNIQUE: Multidetector CT imaging of the abdomen and pelvis was performed using the standard protocol following bolus administration of intravenous contrast. RADIATION DOSE REDUCTION: This exam was performed according to the departmental dose-optimization program which includes automated exposure control, adjustment of the mA and/or kV according to patient size and/or use of iterative reconstruction technique. CONTRAST:  64m OMNIPAQUE IOHEXOL 300  MG/ML  SOLN COMPARISON:  CT abdomen 09/07/2014. FINDINGS: Lower chest: There are patchy airspace opacities in both lung bases with some calcifications in the left lung base. Intra-atrial lipoma partially visualized as se

## 2021-10-23 NOTE — ED Notes (Signed)
Pt has an area approx the size of a baseball sticking out on the right side of the umbilical area. ?

## 2021-10-24 DIAGNOSIS — G9340 Encephalopathy, unspecified: Secondary | ICD-10-CM | POA: Diagnosis not present

## 2021-10-24 DIAGNOSIS — J441 Chronic obstructive pulmonary disease with (acute) exacerbation: Secondary | ICD-10-CM

## 2021-10-24 DIAGNOSIS — R197 Diarrhea, unspecified: Secondary | ICD-10-CM | POA: Diagnosis not present

## 2021-10-24 LAB — CBC
HCT: 39.9 % (ref 39.0–52.0)
Hemoglobin: 12.9 g/dL — ABNORMAL LOW (ref 13.0–17.0)
MCH: 27.1 pg (ref 26.0–34.0)
MCHC: 32.3 g/dL (ref 30.0–36.0)
MCV: 83.8 fL (ref 80.0–100.0)
Platelets: 186 10*3/uL (ref 150–400)
RBC: 4.76 MIL/uL (ref 4.22–5.81)
RDW: 14.1 % (ref 11.5–15.5)
WBC: 7.1 10*3/uL (ref 4.0–10.5)
nRBC: 0 % (ref 0.0–0.2)

## 2021-10-24 LAB — COMPREHENSIVE METABOLIC PANEL
ALT: 10 U/L (ref 0–44)
AST: 11 U/L — ABNORMAL LOW (ref 15–41)
Albumin: 3.2 g/dL — ABNORMAL LOW (ref 3.5–5.0)
Alkaline Phosphatase: 64 U/L (ref 38–126)
Anion gap: 8 (ref 5–15)
BUN: 25 mg/dL — ABNORMAL HIGH (ref 8–23)
CO2: 24 mmol/L (ref 22–32)
Calcium: 8.9 mg/dL (ref 8.9–10.3)
Chloride: 106 mmol/L (ref 98–111)
Creatinine, Ser: 1.62 mg/dL — ABNORMAL HIGH (ref 0.61–1.24)
GFR, Estimated: 47 mL/min — ABNORMAL LOW (ref >60)
Glucose, Bld: 155 mg/dL — ABNORMAL HIGH (ref 70–99)
Potassium: 4.1 mmol/L (ref 3.5–5.1)
Sodium: 138 mmol/L (ref 135–145)
Total Bilirubin: 0.8 mg/dL (ref 0.3–1.2)
Total Protein: 6.5 g/dL (ref 6.5–8.1)

## 2021-10-24 LAB — CBG MONITORING, ED
Glucose-Capillary: 185 mg/dL — ABNORMAL HIGH (ref 70–99)
Glucose-Capillary: 172 mg/dL — ABNORMAL HIGH (ref 70–99)
Glucose-Capillary: 136 mg/dL — ABNORMAL HIGH (ref 70–99)

## 2021-10-24 LAB — RAPID URINE DRUG SCREEN, HOSP PERFORMED
Amphetamines: NOT DETECTED
Barbiturates: NOT DETECTED
Benzodiazepines: POSITIVE — AB
Cocaine: NOT DETECTED
Opiates: POSITIVE — AB
Tetrahydrocannabinol: NOT DETECTED

## 2021-10-24 LAB — AMMONIA: Ammonia: 29 umol/L (ref 9–35)

## 2021-10-24 MED ORDER — INSULIN REGULAR HUMAN (CONC) 500 UNIT/ML ~~LOC~~ SOPN
90.0000 [IU] | PEN_INJECTOR | Freq: Three times a day (TID) | SUBCUTANEOUS | Status: DC
  Administered 2021-10-24 (×2): 90 [IU] via SUBCUTANEOUS
  Filled 2021-10-24: qty 3

## 2021-10-24 NOTE — Discharge Summary (Signed)
?Physician Discharge Summary ?  ?Patient: [name] MRN: [id number] DOB: 1955/11/15  ?Admit date:     10/23/2021  ?Discharge date: 10/25/21  ?Discharge Physician: Corrie Mckusick Chelcee Korpi  ? ?PCP: Carolee Rota, NP  ? ?Recommendations at discharge:  ? ?Follow-up with your primary care provider as outpatient in 1 week. ? ?Discharge Diagnoses: ?Principal Problem: ?  Acute encephalopathy ?Active Problems: ?  Type 2 diabetes mellitus ([place]) ?  COLD (chronic obstructive lung disease) ([place]) ?  Renal cell carcinoma ([place]) ?  Diarrhea ? ?Resolved Problems: ?  * No resolved hospital problems. * ? ?Hospital Course: ?[name] is a 66 y.o. male with past medical history of CAD status post PCI, peripheral vascular disease, recent left foot transmetatarsal amputation for osteomyelitis, COPD, diabetes mellitus on large dose of insulin presented to hospital with complaints of diarrhea for 1 week with abdominal discomfort.  He had taken Imodium in did not have any further episodes while in the hospital so GI pathogen panel and C. difficile could not be sent.  He was also noted to be mildly hypotensive at the PCP's clinic and was sent into the ED.  CT head scan and abdomen scan did not show any acute findings.  He was slightly lethargic with PCO2 of 43.  Lactate was normal.  Patient was then placed in for observation.  ? ?Assessment/Plan ? ?Acute encephalopathy/lethargy - ?Could be secondary to multiple medications that the patient is on including sedative-hypnotics narcotics.  Patient was informed about it.  Patient is on Xanax, Ambien, gabapentin, as outpatient including oxycodone.  Patient is currently at baseline mentation. ? ?Diarrhea  ?None during hospitalization.  Patient takes Imodium at home.  Encouraged oral hydration. ? ?Diabetes mellitus type 2 on insulin 500 which will be continued on discharge. ? ?Essential hypertension  ?Patient was initially hypotensive which has improved.  Was on Imdur tamsulosin Demadex Cardizem as  outpatient.  Vies to resume Demadex starting tomorrow. ? ?CAD status post PCI  ?On Imdur, Cardizem, Plavix, Lipitor.  Will be resumed on discharge. ? ?COPD with chronic hypoxic respiratory failure.  On 6 L of oxygen at baseline.  Patient used to be on hospice in the past for 3 years.  Currently not on hospice. ? ?Peripheral vascular disease, status post left foot transmetatarsal amputation  ?No signs of infection. ? ?Chronic kidney disease stage III  ?Creatinine appears to be at baseline.  ?  ?Consultants: None ? ?Procedures performed: None ? ?Disposition: Home ? ?Diet recommendation:  ?Discharge Diet Orders (From admission, onward)  ? ?  Start     Ordered  ? 10/24/21 0000  Diet - low sodium heart healthy       ? 10/24/21 1354  ? 10/24/21 0000  Diet Carb Modified       ? 10/24/21 1354  ? ?  ?  ? ?  ? ?Cardiac and Carb modified diet ?DISCHARGE MEDICATION: ?Allergies as of 10/24/2021   ? ?   Reactions  ? Lisinopril Other (See Comments)  ? Other reaction(s): worsening kidney function  ? Pollen Extract   ? Other reaction(s): sneezing  ? Oxycodone Itching, Nausea Only  ? Pt is able to tolerate if taken with benadryl  ? ?  ? ?  ?Medication List  ?  ? ?STOP taking these medications   ? ?azithromycin 250 MG tablet ?Commonly known as: ZITHROMAX ?  ? ?  ? ?TAKE these medications   ? ?acetaminophen 325 MG tablet ?Commonly known as: TYLENOL ?Take 650 mg  by mouth See admin instructions. Take 650 mg every 3-4 hours as needed for pain. ?  ?ALPRAZolam 1 MG tablet ?Commonly known as: Duanne Moron ?Take 1 mg by mouth 3 (three) times daily as needed. ?What changed: Another medication with the same name was removed. Continue taking this medication, and follow the directions you see here. ?  ?atorvastatin 20 MG tablet ?Commonly known as: LIPITOR ?Take 20 mg by mouth at bedtime. ?  ?Breztri Aerosphere 160-9-4.8 MCG/ACT Aero ?Generic drug: Budeson-Glycopyrrol-Formoterol ?Inhale 2 puffs into the lungs in the morning and at bedtime. ?  ?budesonide 1  MG/2ML nebulizer solution ?Commonly known as: PULMICORT ?Take 1 mg by nebulization in the morning, at noon, and at bedtime. ?  ?clopidogrel 75 MG tablet ?Commonly known as: PLAVIX ?TAKE ONE TABLET BY MOUTH EVERY DAY ?  ?diltiazem 180 MG 24 hr capsule ?Commonly known as: CARDIZEM CD ?TAKE ONE CAPSULE BY MOUTH EVERY DAY ?  ?diphenhydrAMINE 25 mg capsule ?Commonly known as: BENADRYL ?Take 50 mg by mouth at bedtime. ?  ?esomeprazole 20 MG capsule ?Commonly known as: [place] ?Take 20 mg by mouth daily at 12 noon. ?What changed: Another medication with the same name was removed. Continue taking this medication, and follow the directions you see here. ?  ?FLUoxetine 40 MG capsule ?Commonly known as: PROZAC ?Take 40 mg by mouth daily. ?  ?FLUoxetine 20 MG capsule ?Commonly known as: PROZAC ?Take 20 mg by mouth daily. ?  ?formoterol 20 MCG/2ML nebulizer solution ?Commonly known as: PERFOROMIST ?Take 20 mcg by nebulization as needed (shortness of breath). ?  ?gabapentin 300 MG capsule ?Commonly known as: NEURONTIN ?Take 900 mg by mouth 3 (three) times daily. ?  ?insulin regular human CONCENTRATED 500 UNIT/ML injection ?Commonly known as: HUMULIN R ?Inject 90 Units into the skin 3 (three) times daily with meals. ?  ?isosorbide mononitrate 60 MG 24 hr tablet ?Commonly known as: IMDUR ?TAKE ONE TABLET BY MOUTH EVERY DAY ?  ?linagliptin 5 MG Tabs tablet ?Commonly known as: TRADJENTA ?Take 5 mg by mouth daily. ?  ?nitroGLYCERIN 0.4 MG SL tablet ?Commonly known as: NITROSTAT ?PLACE ONE tablet UNDER THE TONGUE every FIVE minutes as needed FOR CHEST PAIN. IF 3 TABLETS ARE NECESSARY CALL 911 ?  ?oxyCODONE-acetaminophen 10-325 MG tablet ?Commonly known as: PERCOCET ?Take 1 tablet by mouth daily as needed for pain. ?  ?potassium chloride SA 20 MEQ tablet ?Commonly known as: KLOR-CON M ?Take 20 mEq by mouth 2 (two) times daily. ?  ?pregabalin 25 MG capsule ?Commonly known as: LYRICA ?Take 25 mg by mouth 2 (two) times daily. ?  ?PROBIOTIC  PO ?Take 1 capsule by mouth daily. ?  ?tamsulosin 0.4 MG Caps capsule ?Commonly known as: FLOMAX ?Take 0.4 mg by mouth daily. ?  ?torsemide 20 MG tablet ?Commonly known as: DEMADEX ?TAKE THREE TABLETS BY MOUTH TWICE DAILY ?What changed: when to take this ?  ?True Metrix Blood Glucose Test test strip ?Generic drug: glucose blood ?  ?zolpidem 5 MG tablet ?Commonly known as: AMBIEN ?Take 5 mg by mouth at bedtime. For insomnia. ?  ? ?  ? ?Subjective ? Today, patient was seen and examined at bedside.  Patient feels at his baseline.  Denies any chest pain, shortness of breath, dizziness, lightheadedness or confusion.  Has not had any diarrhea since coming to the hospital.  Wishes to go home. ? ?Discharge Exam: ?Danley Danker Weights  ? 10/23/21 1633  ?Weight: 99.5 kg  ? ? ?  10/24/2021  ?  2:00 PM  10/24/2021  ? 11:30 AM 10/24/2021  ? 11:00 AM  ?Vitals with BMI  ?Systolic 119 417 408  ?Diastolic 82 84 74  ?Pulse 87 85 73  ?  ?General:  Average built, not in obvious distress.  On nasal cannula oxygen ?HENT:   No scleral pallor or icterus noted. Oral mucosa is moist.  ?Chest:   Diminished breath sounds bilaterally.  ?CVS: S1 &S2 heard. No murmur.  Regular rate and rhythm. ?Abdomen: Soft, nontender, nondistended.  Bowel sounds are heard.   ?Extremities: No cyanosis, clubbing or edema.  Peripheral pulses are palpable.  Left foot transmetatarsal area appears clean. ?Psych: Alert, awake and oriented, normal mood ?CNS:  No cranial nerve deficits.  Power equal in all extremities.   ?Skin: Warm and dry.  No rashes noted. ? ?Condition at discharge: good ? ?The results of significant diagnostics from this hospitalization (including imaging, microbiology, ancillary and laboratory) are listed below for reference.  ? ?Imaging Studies: ?CT Head Wo Contrast ? ?Result Date: 10/23/2021 ?CLINICAL DATA:  Mental status change, unknown cause EXAM: CT HEAD WITHOUT CONTRAST TECHNIQUE: Contiguous axial images were obtained from the base of the skull through the  vertex without intravenous contrast. RADIATION DOSE REDUCTION: This exam was performed according to the departmental dose-optimization program which includes automated exposure control, adjustment of th

## 2021-10-24 NOTE — ED Notes (Signed)
ED TO INPATIENT HANDOFF REPORT ? ?ED Nurse Name and Phone #: Deneise Lever [phone]  ? ?S ?Name/Age/Gender ?[name] ?66 y.o. ?male ?Room/Bed: 016C/016C ? ?Code Status ?  Code Status: DNR ? ?Home/SNF/Other ?Home ?Patient oriented to: self, place, time, and situation ?Is this baseline? Yes  ? ?Triage Complete: Triage complete  ?Chief Complaint ?Acute encephalopathy [G93.40] ? ?Triage Note ?No notes on file  ? ?Allergies ?Allergies  ?Allergen Reactions  ? Lisinopril Other (See Comments)  ?  Other reaction(s): worsening kidney function  ? Pollen Extract   ?  Other reaction(s): sneezing  ? Oxycodone Itching and Nausea Only  ?  Pt is able to tolerate if taken with benadryl  ? ? ?Level of Care/Admitting Diagnosis ?ED Disposition   ? ? ED Disposition  ?Admit  ? Condition  ?--  ? Comment  ?Hospital Area: [facility] [[id number]] ? Level of Care: Progressive [102] ? Admit to Progressive based on following criteria: MULTISYSTEM THREATS such as stable sepsis, metabolic/electrolyte imbalance with or without encephalopathy that is responding to early treatment. ? May place patient in observation at [facility] or [place] if equivalent level of care is available:: No ? Covid Evaluation: Asymptomatic - no recent exposure (last 10 days) testing not required ? Diagnosis: Acute encephalopathy [453646] ? Admitting Physician: Rise Patience [phone] ? Attending Physician: Rise Patience [phone] ?  ?  ? ?  ? ? ?B ?Medical/Surgery History ?Past Medical History:  ?Diagnosis Date  ? Anxiety   ? Arthritis   ? Cholecystitis, acute 12/20/2013  ? Lap chole 6/5  ? Cocaine abuse ([place])   ? COPD (chronic obstructive pulmonary disease) ([place])   ? Coronary atherosclerosis of native coronary artery   ? a. 03/09/2013 Cath/PCI: LM nl, LAD: 50p, 56m(2.5x16 promus DES), LCX nl, OM1 min irregs, LPL/LPDA diff dzs, RCA nondom, mod diff dzs, EF 55%.  ? Depression   ? Essential hypertension   ? GERD (gastroesophageal reflux disease)   ?  Headache   ? Hepatitis Late 1970s  ? History of complete ray amputation of first toe of left foot ([place] 04/24[place]  ? History of nephrolithiasis   ? History of pneumonia   ? History of stroke   ? Right MCA distribution, residual left-sided weakness  ? Hyperlipidemia   ? Noncompliance   ? Osteomyelitis of second toe of left foot ([place] 04/24/2021  ? Renal cell carcinoma ([place]   ? Status post radical right nephrectomy August 2015  ? Sleep apnea   ? On CPAP, 4L O2 no cpap at home yet  ? Type 2 diabetes mellitus ([place]  ? ?Past Surgical History:  ?Procedure Laterality Date  ? ABDOMINAL AORTOGRAM W/LOWER EXTREMITY Left 07/17/2021  ? Procedure: ABDOMINAL AORTOGRAM W/LOWER EXTREMITY;  Surgeon: BSerafina Mitchell MD;  [facility];  Service: Cardiovascular;  Laterality: Left;  ? AMPUTATION Left 12/27/2020  ? Procedure: PARTIAL FIRST RAY AMPUTATION OF FOOT;  Surgeon: PFelipa Furnace DPM;  [place] [facility]  Service: Podiatry;  Laterality: Left;  ? APPENDECTOMY  1970's  ? CHOLECYSTECTOMY N/A 12/22/2013  ? Procedure: LAPAROSCOPIC CHOLECYSTECTOMY ;  Surgeon: DHarl Bowie MD;  [place] [facility]  Service: General;  Laterality: N/A;  ? ENDARTERECTOMY Right 10/08/2014  ? Procedure: Right ENDARTERECTOMY CAROTID;  Surgeon: TRosetta Posner MD;  [facility]  Service: Vascular;  Laterality: Right;  ? LAPAROSCOPIC LYSIS OF ADHESIONS  02/21/2014  ? Procedure: LAPAROSCOPIC LYSIS OF ADHESIONS EXTINSIVE;  Surgeon:  [name], MD;  [facility];  Service: Urology;;  ? LEFT HEART CATHETERIZATION WITH CORONARY ANGIOGRAM N/A 06/02/2012  ? Procedure: LEFT HEART CATHETERIZATION WITH CORONARY ANGIOGRAM;  Surgeon: [name], MD;  Location: [facility] C[facility] LAB;  Service: Cardiovascular;  Laterality: N/A;  ? LEFT HEART CATHETERIZATION WITH CORONARY ANGIOGRAM N/A 03/09/2013  ? Procedure: LEFT HEART CATHETERIZATION WITH CORONARY ANGIOGRAM;  Surgeon: [name], MD;  Location: [facility] CATH LAB;  Service: Cardiovascular;   Laterality: N/A;  ? LEFT HEART CATHETERIZATION WITH CORONARY ANGIOGRAM N/A 12/20/2013  ? Procedure: LEFT HEART CATHETERIZATION WITH CORONARY ANGIOGRAM;  Surgeon: [name], MD;  Location: [facility] CATH LAB;  Service: Cardiovascular;  Laterality: N/A;  ? LUNG BIOPSY Left 05/18/2014  ? Procedure: LUNG BIOPSY left upper lobe & left lower lobe;  Surgeon: [name], MD;  Location: [place];  Service: Thoracic;  Laterality: Left;  ? PATCH ANGIOPLASTY Right 10/08/2014  ? Procedure: PATCH ANGIOPLASTY Right Carotid;  Surgeon: [name], MD;  Location: [place];  Service: Vascular;  Laterality: Right;  ? PERIPHERAL VASCULAR INTERVENTION Left 07/17/2021  ? Procedure: PERIPHERAL VASCULAR INTERVENTION;  Surgeon: Serafina Mitchell, MD;  Location: [place] [facility];  Service: Cardiovascular;  Laterality: Left;  Superficial Femoral Artery  ? ROBOT ASSISTED LAPAROSCOPIC NEPHRECTOMY Right 02/21/2014  ? Procedure: ROBOTIC ASSISTED LAPAROSCOPIC RIGHT NEPHRECTOMY ;  Surgeon: [name], MD;  [facility];  Service: Urology;  Laterality: Right;  ? VIDEO ASSISTED THORACOSCOPY Left 05/18/2014  ? Procedure: LEFT VIDEO ASSISTED THORACOSCOPY;  Surgeon: [name], MD;  Location: [place];  Service: Thoracic;  Laterality: Left;  ? VIDEO BRONCHOSCOPY  05/18/2014  ? Procedure: VIDEO BRONCHOSCOPY;  Surgeon: [name], MD;  Location: [place];  Service: Thoracic;;  ?  ? ?A ?IV Location/Drains/Wounds ?Patient Lines/Drains/Airways Status   ? ? Active Line/Drains/Airways   ? ? Name Placement date Placement time Site Days  ? Peripheral IV 10/23/21 18 G Left;Posterior Forearm 10/23/21  --  Forearm  1  ? Incision (Closed) 12/27/20 Foot Left 12/27/20  1246  -- 301  ? Incision (Closed) 07/17/21 Groin Anterior;Proximal;Right 07/17/21  1246  -- 99  ? Incision (Closed) 07/17/21 Foot Anterior;Left 07/17/21  1246  -- 99  ? ?  ?  ? ?  ? ? ?Intake/Output Last 24 hours ?No intake or output data in the 24 hours ending 10/24/21  1321 ? ?Labs/Imaging ?Results for orders placed or performed during the hospital encounter of 10/23/21 (from the past 48 hour(s))  ?CBC     Status: Abnormal  ? Collection Time: 10/23/21  4:41 PM  ?Result Value Ref Range  ? WBC 9.4 4.0 - 10.5 K/uL  ? RBC 4.73 4.22 - 5.81 MIL/uL  ? Hemoglobin 12.8 (L) 13.0 - 17.0 g/dL  ? HCT 40.0 39.0 - 52.0 %  ? MCV 84.6 80.0 - 100.0 fL  ? MCH 27.1 26.0 - 34.0 pg  ? MCHC 32.0 30.0 - 36.0 g/dL  ? RDW 14.5 11.5 - 15.5 %  ? Platelets 257 150 - 400 K/uL  ? nRBC 0.0 0.0 - 0.2 %  ?  Comment: Performed at [facility], [place] [address] [place], [place] [address]  ?Urinalysis, Routine w reflex microscopic Urine, Clean Catch     Status: None  ? Collection Time: 10/23/21  4:41 PM  ?Result Value Ref Range  ? Color, Urine YELLOW YELLOW  ? APPearance CLEAR CLEAR  ? Specific Gravity, Urine 1.009 1.005 - 1.030  ?  pH 5.0 5.0 - 8.0  ? Glucose, UA NEGATIVE NEGATIVE mg/dL  ? Hgb urine dipstick NEGATIVE NEGATIVE  ? Bilirubin Urine NEGATIVE NEGATIVE  ? Ketones, ur NEGATIVE NEGATIVE mg/dL  ? Protein, ur NEGATIVE NEGATIVE mg/dL  ? Nitrite NEGATIVE NEGATIVE  ? Leukocytes,Ua NEGATIVE NEGATIVE  ?  Comment: Performed at [facility], [place] [address] [place], [place] [address]  ?Urine rapid drug screen (hosp performed)     Status: Abnormal  ? Collection Time: 10/23/21  4:41 PM  ?Result Value Ref Range  ? Opiates POSITIVE (A) NONE DETECTED  ? Cocaine NONE DETECTED NONE DETECTED  ? Benzodiazepines POSITIVE (A) NONE DETECTED  ? Amphetamines NONE DETECTED NONE DETECTED  ? Tetrahydrocannabinol NONE DETECTED NONE DETECTED  ? Barbiturates NONE DETECTED NONE DETECTED  ?  Comment: (NOTE) ?DRUG SCREEN FOR MEDICAL PURPOSES ?ONLY.  IF CONFIRMATION IS NEEDED ?FOR ANY PURPOSE, NOTIFY LAB ?WITHIN 5 DAYS. ? ?LOWEST DETECTABLE LIMITS ?FOR URINE DRUG SCREEN ?Drug Class                     Cutoff (ng/mL) ?Amphetamine and metabolites    1000 ?Barbiturate and metabolites    200 ?Benzodiazepine                  200 ?Tricyclics and metabolites     300 ?Opiates and metabolites        300 ?Cocaine and metabolites        300 ?THC                            50 ?Performed at [facility], [place] [address], [place], [place] ?[address] ?

## 2021-10-24 NOTE — Hospital Course (Signed)
[name] is a 66 y.o. male with past medical history of CAD status post PCI, peripheral vascular disease, recent left foot transmetatarsal amputation for osteomyelitis, COPD, diabetes mellitus on large dose of insulin presented to hospital with complaints of diarrhea for 1 week with abdominal discomfort.  He had taken Imodium in did not have any further episodes while in the hospital so GI pathogen panel and C. difficile could not be sent.  He was also noted to be mildly hypotensive at the PCP's clinic and was sent into the ED.  CT head scan and abdomen scan did not show any acute findings.  He was slightly lethargic with PCO2 of 43.  Lactate was normal.  Patient was then placed in for observation.  ? ?Assessment/Plan ? ?Acute encephalopathy/lethargy - ?Could be secondary to multiple medications that the patient is on including sedative-hypnotics narcotics.  Patient was informed about it.  Patient is on Xanax, Ambien, gabapentin, as outpatient including oxycodone.  Patient is currently at baseline mentation. ? ?Diarrhea  ?None during hospitalization.  Patient takes Imodium at home.  Encouraged oral hydration. ? ?Diabetes mellitus type 2 on insulin 500 which will be continued on discharge. ? ?Essential hypertension  ?Patient was initially hypotensive which has improved.  Was on Imdur tamsulosin Demadex Cardizem as outpatient.  Vies to resume Demadex starting tomorrow. ? ?CAD status post PCI  ?On Imdur, Cardizem, Plavix, Lipitor.  Will be resumed on discharge. ? ?COPD with chronic hypoxic respiratory failure.  On 6 L of oxygen at baseline.  Patient used to be on hospice in the past for 3 years.  Currently not on hospice. ? ?Peripheral vascular disease, status post left foot transmetatarsal amputation  ?No signs of infection. ? ?Chronic kidney disease stage III  ?Creatinine appears to be at baseline.  ?  ?

## 2021-11-06 DIAGNOSIS — Z794 Long term (current) use of insulin: Secondary | ICD-10-CM | POA: Diagnosis not present

## 2021-11-06 DIAGNOSIS — E1140 Type 2 diabetes mellitus with diabetic neuropathy, unspecified: Secondary | ICD-10-CM | POA: Diagnosis not present

## 2021-11-19 ENCOUNTER — Ambulatory Visit (INDEPENDENT_AMBULATORY_CARE_PROVIDER_SITE_OTHER): Payer: Medicaid Other | Admitting: Podiatry

## 2021-11-19 DIAGNOSIS — E1142 Type 2 diabetes mellitus with diabetic polyneuropathy: Secondary | ICD-10-CM

## 2021-11-19 DIAGNOSIS — L97522 Non-pressure chronic ulcer of other part of left foot with fat layer exposed: Secondary | ICD-10-CM

## 2021-11-19 NOTE — Progress Notes (Signed)
?Subjective:  ?Patient ID: [name], male    DOB: [birth year],  MRN: [id number] ? ?Chief Complaint  ?Patient presents with  ? Wound Check  ? ? ?66 y.o. male presents for wound care. Patient presents with left foot transmetatarsal amputation stump wound.  Patient surgery was on 05/05/2021.  He states he is doing a lot better.  The wound center has healed the wound.  He is here for his diabetic shoes with the filler. ? ? ? ?07/17/2021 vascular surgery report:Impression: ?            #1  Left superficial femoral artery occlusion, successfully crossed and stented using 7 mm overlapping Elluvia stents.  This did require pedal access. ?            #2  Three-vessel runoff to the left foot ?            #3  A total of 60 cc of contrast were utilized for the procedure. ? ? ?Review of Systems: Negative except as noted in the HPI. Denies N/V/F/Ch. ? ?Past Medical History:  ?Diagnosis Date  ? Anxiety   ? Arthritis   ? Cholecystitis, acute 12/20/2013  ? Lap chole 6/5  ? Cocaine abuse ([place])   ? COPD (chronic obstructive pulmonary disease) ([place])   ? Coronary atherosclerosis of native coronary artery   ? a. 03/09/2013 Cath/PCI: LM nl, LAD: 50p, 15m(2.5x16 promus DES), LCX nl, OM1 min irregs, LPL/LPDA diff dzs, RCA nondom, mod diff dzs, EF 55%.  ? Depression   ? Essential hypertension   ? GERD (gastroesophageal reflux disease)   ? Headache   ? Hepatitis Late 1970s  ? History of complete ray amputation of first toe of left foot ([place] 04/24/2021  ? History of nephrolithiasis   ? History of pneumonia   ? History of stroke   ? Right MCA distribution, residual left-sided weakness  ? Hyperlipidemia   ? Noncompliance   ? Osteomyelitis of second toe of left foot ([place] 04/24/2021  ? Renal cell carcinoma ([place]   ? Status post radical right nephrectomy August 2015  ? Sleep apnea   ? On CPAP, 4L O2 no cpap at home yet  ? Type 2 diabetes mellitus ([facility] 2011  ? ? ?Current Outpatient Medications:  ?  acetaminophen (TYLENOL) 325 MG tablet, Take  650 mg by mouth See admin instructions. Take 650 mg every 3-4 hours as needed for pain., Disp: , Rfl:  ?  ALPRAZolam (XANAX) 1 MG tablet, Take 1 mg by mouth 3 (three) times daily as needed., Disp: , Rfl:  ?  atorvastatin (LIPITOR) 20 MG tablet, Take 20 mg by mouth at bedtime., Disp: , Rfl: 1 ?  Budeson-Glycopyrrol-Formoterol (BREZTRI AEROSPHERE) 160-9-4.8 MCG/ACT AERO, Inhale 2 puffs into the lungs in the morning and at bedtime., Disp: 10.7 g, Rfl: 5 ?  budesonide (PULMICORT) 1 MG/2ML nebulizer solution, Take 1 mg by nebulization in the morning, at noon, and at bedtime., Disp: , Rfl:  ?  clopidogrel (PLAVIX) 75 MG tablet, TAKE ONE TABLET BY MOUTH EVERY DAY (Patient taking differently: Take 75 mg by mouth daily.), Disp: 90 tablet, Rfl: 2 ?  diltiazem (CARDIZEM CD) 180 MG 24 hr capsule, TAKE ONE CAPSULE BY MOUTH EVERY DAY (Patient taking differently: Take 180 mg by mouth daily.), Disp: 90 capsule, Rfl: 2 ?  diphenhydrAMINE (BENADRYL) 25 mg capsule, Take 50 mg by mouth at bedtime., Disp: , Rfl:  ?  esomeprazole (NEXIUM) 20 MG capsule, Take 20 mg by mouth  daily at 12 noon., Disp: , Rfl:  ?  FLUoxetine (PROZAC) 20 MG capsule, Take 20 mg by mouth daily., Disp: , Rfl:  ?  FLUoxetine (PROZAC) 40 MG capsule, Take 40 mg by mouth daily., Disp: , Rfl:  ?  formoterol (PERFOROMIST) 20 MCG/2ML nebulizer solution, Take 20 mcg by nebulization as needed (shortness of breath)., Disp: , Rfl:  ?  gabapentin (NEURONTIN) 300 MG capsule, Take 900 mg by mouth 3 (three) times daily., Disp: , Rfl:  ?  glucose blood (TRUE METRIX BLOOD GLUCOSE TEST) test strip, , Disp: , Rfl:  ?  insulin regular human CONCENTRATED (HUMULIN R) 500 UNIT/ML injection, Inject 90 Units into the skin 3 (three) times daily with meals., Disp: , Rfl:  ?  isosorbide mononitrate (IMDUR) 60 MG 24 hr tablet, TAKE ONE TABLET BY MOUTH EVERY DAY (Patient taking differently: Take 60 mg by mouth daily.), Disp: 90 tablet, Rfl: 1 ?  linagliptin (TRADJENTA) 5 MG TABS tablet, Take 5  mg by mouth daily., Disp: , Rfl:  ?  nitroGLYCERIN (NITROSTAT) 0.4 MG SL tablet, PLACE ONE tablet UNDER THE TONGUE every FIVE minutes as needed FOR CHEST PAIN. IF 3 TABLETS ARE NECESSARY CALL 911, Disp: 25 tablet, Rfl: 3 ?  oxyCODONE-acetaminophen (PERCOCET) 10-325 MG tablet, Take 1 tablet by mouth daily as needed for pain., Disp: , Rfl:  ?  potassium chloride SA (K-DUR,KLOR-CON) 20 MEQ tablet, Take 20 mEq by mouth 2 (two) times daily. , Disp: , Rfl:  ?  pregabalin (LYRICA) 25 MG capsule, Take 25 mg by mouth 2 (two) times daily., Disp: , Rfl:  ?  Probiotic Product (PROBIOTIC PO), Take 1 capsule by mouth daily., Disp: , Rfl:  ?  tamsulosin (FLOMAX) 0.4 MG CAPS capsule, Take 0.4 mg by mouth daily. , Disp: , Rfl:  ?  torsemide (DEMADEX) 20 MG tablet, TAKE THREE TABLETS BY MOUTH TWICE DAILY (Patient taking differently: Take 60 mg by mouth 2 (two) times daily.), Disp: 540 tablet, Rfl: 0 ?  zolpidem (AMBIEN) 5 MG tablet, Take 5 mg by mouth at bedtime. For insomnia., Disp: , Rfl:  ? ?Social History  ? ?Tobacco Use  ?Smoking Status Every Day  ? Packs/day: 1.00  ? Years: 41.00  ? Pack years: 41.00  ? Types: Cigarettes  ? Start date: 05/03/1972  ? Passive exposure: Never  ?Smokeless Tobacco Never  ?Tobacco Comments  ? 1/2 ppd 06/05/21  ? ? ?Allergies  ?Allergen Reactions  ? Lisinopril Other (See Comments)  ?  Other reaction(s): worsening kidney function  ? Pollen Extract   ?  Other reaction(s): sneezing  ? Oxycodone Itching and Nausea Only  ?  Pt is able to tolerate if taken with benadryl  ? ?Objective:  ?There were no vitals filed for this visit. ?There is no height or weight on file to calculate BMI. ?Constitutional Well developed. ?Well nourished.  ?Vascular Dorsalis pedis pulses palpable bilaterally. ?Posterior tibial pulses palpable bilaterally. ?Capillary refill normal to all digits.  ?No cyanosis or clubbing noted. ?Pedal hair growth normal.  ?Neurologic Normal speech. ?Oriented to person, place, and time. ?Protective  sensation absent  ?Dermatologic Wound Location: Left transmetatarsal amputation site is completely healed.  No further dehiscence noted no gapping noted.  ?Orthopedic: No pain to palpation either foot.  ? ?Radiographs: None ?Assessment:  ? ?No diagnosis found. ? ? ?Plan:  ?Patient was evaluated and treated and all questions answered. ? ?Ulcer left foot ulceration status post transmetatarsal amputation with fat layer exposed ?-The wound has completely  reepithelialized.  And the TMA site has completely healed.  Given that his uncontrolled diabetic with A1c of 10.1 I discussed glucose management and he will benefit from diabetic shoes given the history of amputation with the failure.  I discussed with patient he states understanding ?--To be scheduled with Aaron Edelman to get diabetic shoes with a TMA filler ? ?No follow-ups on file. ? ?  ?

## 2021-11-26 ENCOUNTER — Ambulatory Visit: Payer: Medicare Other

## 2021-11-26 DIAGNOSIS — E1142 Type 2 diabetes mellitus with diabetic polyneuropathy: Secondary | ICD-10-CM

## 2021-11-26 DIAGNOSIS — Z9889 Other specified postprocedural states: Secondary | ICD-10-CM

## 2021-11-26 NOTE — Progress Notes (Signed)
SITUATION ?Reason for Consult: Evaluation for Prefabricated Diabetic Shoes and Custom Diabetic Inserts. ?Patient / Caregiver Report: Patient would like well fitting shoes ? ?OBJECTIVE DATA: ?Patient History / Diagnosis:  ?  ICD-10-CM   ?1. Diabetic peripheral neuropathy associated with type 2 diabetes mellitus ([place])  E11.42   ?  ?2. Status post foot surgery  Z98.890   ?  ? ? ?Physician Treating Diabetes:  Supervisor of Carolee Rota NP ? ?Current or Previous Devices:   None and no history ? ?In-Person Foot Examination: ?Ulcers & Callousing:   Historical ?Deformities:    Left transmet amputation ?Sensation:    Compromised  ?Shoe Size:     11W ? ?ORTHOTIC RECOMMENDATION ?Recommended Devices: ?- 1x pair prefabricated PDAC approved diabetic shoes; Patient Selected B5000M Size 11W ?- 3x pair custom-to-patient PDAC approved vacuum formed diabetic insoles. ? ?GOALS OF SHOES AND INSOLES ?- Reduce shear and pressure ?- Reduce / Prevent callus formation ?- Reduce / Prevent ulceration ?- Protect the fragile healing compromised diabetic foot. ? ?Patient would benefit from diabetic shoes and inserts as patient has diabetes mellitus and the patient has one or more of the following conditions: ?- History of partial or complete amputation of the foot ?- History of previous foot ulceration. ?- History of pre-ulcerative callus ?- Peripheral neuropathy with evidence of callus formation ?- Foot deformity ?- Poor circulation ? ?ACTIONS PERFORMED ?Potential out of pocket cost was communicated to patient. Patient understood and consented to measurement and casting. Patient was casted for insoles via crush box and measured for shoes via brannock device. Procedure was explained and patient tolerated procedure well. All questions were answered and concerns addressed. Casts were shipped to central fabrication for HOLD until Certificate of Medical Necessity or otherwise necessary authorization from insurance is obtained. ? ?PLAN ?Shoes are to  be ordered and casts released from hold once all appropriate paperwork is complete. Patient is to be contacted and scheduled for fitting once shoes and insoles have been fabricated and received. ? ?

## 2021-12-09 ENCOUNTER — Other Ambulatory Visit: Payer: Self-pay | Admitting: Cardiology

## 2021-12-17 ENCOUNTER — Telehealth: Payer: Self-pay

## 2021-12-17 NOTE — Telephone Encounter (Signed)
2 pm.  Follow up call made to patient to check on overall status and offer a home visit.  Patient would like to be seen by NP.  Visit scheduled for 01/06/22 @ 11 am.

## 2021-12-23 ENCOUNTER — Encounter: Payer: Self-pay | Admitting: Internal Medicine

## 2021-12-23 ENCOUNTER — Ambulatory Visit (INDEPENDENT_AMBULATORY_CARE_PROVIDER_SITE_OTHER): Payer: Medicare Other | Admitting: Internal Medicine

## 2021-12-23 VITALS — BP 128/72 | HR 108 | Ht 68.0 in | Wt 222.6 lb

## 2021-12-23 DIAGNOSIS — R062 Wheezing: Secondary | ICD-10-CM

## 2021-12-23 DIAGNOSIS — J449 Chronic obstructive pulmonary disease, unspecified: Secondary | ICD-10-CM | POA: Diagnosis not present

## 2021-12-23 DIAGNOSIS — J9611 Chronic respiratory failure with hypoxia: Secondary | ICD-10-CM | POA: Diagnosis not present

## 2021-12-23 DIAGNOSIS — J438 Other emphysema: Secondary | ICD-10-CM

## 2021-12-23 DIAGNOSIS — Z720 Tobacco use: Secondary | ICD-10-CM

## 2021-12-23 MED ORDER — PREDNISONE 10 MG PO TABS
ORAL_TABLET | ORAL | 0 refills | Status: AC
Start: 2021-12-23 — End: 2021-12-28

## 2021-12-23 NOTE — Patient Instructions (Signed)
ICD-10-CM   1. Chronic obstructive pulmonary disease, unspecified COPD type ([place])  J44.9     2. Chronic respiratory failure with hypoxia ([name])  J96.11     3. Other emphysema ([place])  J43.8     4. Tobacco abuse  Z72.0     5. Chronic kidney disease, unspecified CKD stage  N18.9        On your latest CT scan there is no more evidence of interstitial lung disease/pulmonary fibrosis. Instead the dominant feature is emphysema  Currently might be in a bit of COPD flareup or emphysema flareup- but could just be that you have baseline wheezing  Overall shortness of breath is multifactorial and this includes emphysema, weight, physical deconditioning and other reasons    Plan - stop smoking -- Please take prednisone 40 mg x1 day, then 30 mg x1 day, then 20 mg x1 day, then 10 mg x1 day, and then 5 mg x1 day and stop ---Continue oxygen as before -Contnue BREZTRI inhaler  2 puff twice daily =- Other prn nebulizers  as before     Follow-up -6 months or sooner if needed  - ESAS symptom score at followup

## 2021-12-23 NOTE — Progress Notes (Signed)
OV 04/10/2015  Chief Complaint  Patient presents with   Follow-up    Pt states his breathing has worsened since last OV. Pt was admitted to [facility] for CP and syncope. Pt c/o increase in DOE, prod cough with dark brown mucus, chest disfomfort when active.    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 At that point in time spirometry showed a significant decline in forced vital capacity from 4 L at baseline to 2.6 L. He was using 4 L of oxygen at rest and 6 L with exertion at home. However we could not convincingly document hypoxemia with exertion on room air in the office at that time. Therefore to this and I ordered pulmonary function tests and CT scan of the chest. He did have CT scan of the chest in August 2016. There is no report on progression compared to February 2016 but I suspect the findings are similar. While he underwent pulmonary function test he had cough syncope and was admitted to the hospital. Cardiology notes reviewed and the final diagnosis was cough syncope. He he now presents for follow-up. He continues to be miserable with class IV dyspnea. He uses 4-6 liters of oxygen because he subjectively feels the need to do that. He does not monitor his pulse ox at home. He says that he continues to have cough syncope at home and has fallen several times. He has now moved to live with his 67 year old mom. He is socially isolated otherwise. He feels like he needs an extra layer of help at home. There are no other issues  Pulse oximetry on room air at rest and walking desaturation test 185 feet 3 laps on room air: RA rest after 15 min off o2, pulse o98% and lowest pulse ox with forehead probe was 90%. He did get dizzy with coughing   OV 06/05/15 66 year old man with tobacco abuse and history of interstitial lung disease,  desquamative interstitial pneumonitis and burnt out Langerhans' cell histiocytosis, hypoxemic respiratory failure; untreated OSA unable to tolerate CPAP. He probably also has some degree of superimposed COPD.  He presents today with two weeks of increased dyspnea, . He is having paroxysms of cough, can lead to pre-syncope / syncope. Last was 2 days ago. He has LE edema, probable more than his baseline. He is using duoneb bid, symbicort bid, albuterol prn - averages once a day. He complains of dry mouth. He took extra lasix after last OV here with TP, helped his edema but not his dyspnea.     OV 07/05/2015  Chief Complaint  Patient presents with   Follow-up    Pt states his SOB has worsened since last OV. Pt also c/o increase in nonprod cough, pt states he frequently coughs when eating or drinking. Pt c/o right lateral chest pain when coughing. Pt denies f/c/s.   Admit    66yo male smoker with  ILD(DIP)  on Oxygen .  Polysubstance abuse hx.   08/09/2015 [facility] follow up  Patient returns for a post hospital follow-up. Patient was recently admitted with acute on chronic respiratory failure with underlying interstitial lung disease for  acute on chronic diastolic heart failure. He improved with diuresis .  He says he is feeling improved. Has decreased shortness of breath. Unfortunately continues to smoke. We discussed smoking cessation. He denies any hemoptysis, chest pain, orthopnea, PND,  or increased leg swelling.    OV 10/14/2015  Chief Complaint  Patient presents with   Follow-up    Still having SOB, coughing and mild wheezing. Pt states that he feels like fluid is coming back especially on left side.    Follow-up interstitial lung disease and chronic hypoxemic respiratory failure associated with volume overload and diastolic dysfunction and coronary artery disease and multiple medical problems including depression and anxiety and poor social situation  He presents for routine  follow-up. During stable health times of November been able to document hypoxemia and him on room air at rest. Also during admissions he has been hypoxemic. Again today on room air 10 minutes his pulse ox was 97%. He insists that he gets very dyspneic when he turns his oxygen off and he needs 5 L of oxygen at all times. He does not check his pulse ox at home so we do not know his true oxygen levels when he is off oxygen. He has class III-for dyspnea. He is on oxygen. He lives with his mom was 67 years old and takes care of him. Overall he feels stable. [place] symptom assessment score documented below and it is obvious that is significant problems are fatigue, depression, anxiety and shortness of breath. He is open to undergo home palliative care program  [facility] Symptom Assessment Numerical Scale 0 is no problem -> 10 worst problem 10/14/2015    No Pain -> Worst pain 0  No Tiredness -> Worset tiredness 10  No Nausea -> Worst nausea 0  No Depression -> Worst depression 7  No Anxiety -> Worst Anxiety 8  No Drowsiness -> Worst Drowsiness 10  Best appetite-> Worst Appetitle 3  Best Feeling of well being -> Worst feeling 5  No dyspnea-> Worst dyspnea 8  Other problem (none -> severe) 10 - chest discomfort, when active  Completed by  patient         Last CT chest December 2016 and when compared to August 2016 no change in DIC pattern of ILD  Chest x-ray 10/04/2015: Reported as chronic changes or jaw baseline. I do not have these image for visualization  Lab work 07/26/2015: Shows a creatinine of 1.68 mg percent with a GFR of 43. Hemoglobin 13 g percent.  Cardiology visit with Dr. Domenic Polite 08/28/2015: There is discussion about switching his Lasix and metolazone and Demadex. Volume overload is likely multifactorial with RV dysfunction a significant component  Subjective:     Patient ID: [name], male   DOB: May 31, 1956, 66 y.o.   MRN: [id number]  HPI   OV 04/23/2017  Chief  Complaint  Patient presents with   Follow-up    Pt states that his breathing is worse than before. C/o SOB all the time, coughing, and chest pain. 6L O2 24/7.   Follow-up ILD chronic hypoxemic respiratory failure. He did have a high-resolution CT scan of the chest in September 2018. The shows mostly pulmonary edema and fluid pattern. I personally not seen in the last year and a half. He says that he continues to live with his mom. [facility] is visiting him 3 times a week and giving supportive care. He is on a lot of opioids and anxiolytics. He easily is nodding off in the exam room. He doesn't seem interested in giving me a good history. He has no insight into his disease. [place] symptom assessment score shows significant worsening of fatigue and pain and depression and overall high symptom  burden.   OV 03/10/2018  Chief Complaint  Patient presents with   Follow-up    Pt states he is having a lot of pain with his lungs and it is very unbearable. Pt also states his SOB is a lot worse, coughs all the time, has a lot of back pain, is very unsteady on his feet, and also has occ. CP.    [name] , 66 y.o. , with dob [birth year] and male ,Not Hispanic or Latino from [address] [place] [address] - presents to lung clinic for hronic hypoxemic respiratory failure that previously eyes seen was on the basis of ILD but CT scan last in 2018 showed that he did not have ILD. In fact he's had a follow-up CT scan of the chest May 2019 that does not show ILD. But he does have chronic hypoxemic respiratory failure. The basis of this is poorly understood but given his smoking history and 2016 pulmonary function test showing isolated reduction in diffusion capacity this would be all COPD/emphysema. He has missed many appointments and is very poor with history given his opioid dependence. Today he is a little bit more awake and he tells me that he continues to be on 5 L oxygen  nasal cannula. He says that he frequently dozes off and loses balance and falls.He also says for the last few months his lungs have been hurting but is no cough or congestion. He says he is on nebulizers but we do not have this in the med list. He does not know what nebulizers he is on. On exam today was significantly wheezing according to the medical assistant.   OV 08/01/2018  Chief Complaint  Patient presents with   Follow-up    Pt states his breathing has become worse since last visit, has a dry cough, has had chest pain, and states his lungs have hurt. Pt states he feels like his lungs are burning.      OV 08/01/2018  Subjective:  Patient ID: [name], male , DOB: 1956-03-20 , age 71 y.o. , MRN: [id number] , ADDRESS: [place] [address]   08/01/2018 -   Chief Complaint  Patient presents with   Follow-up    Pt states his breathing has become worse since last visit, has a dry cough, has had chest pain, and states his lungs have hurt. Pt states he feels like his lungs are burning.     HPI [name] 66 y.o. -presents for his follow-up of mixed emphysema with DIP/burned-out Langerhans' cell histiocytosis.  Since his last visit he got admitted November 2019 with acute on chronic hypoxemic respiratory failure.  In the interim he continues to smoke.  He is no longer in hospice but is requesting hospice back because he benefited from the extra layer of support of the palliative piece.  He thinks he might be dying but there is no evidence of that.  His primary care physician ordered ultrasound of the liver that is reported as diffuse hepatocellular disease not otherwise specified.  He will follow-up with [place] primary care physician about it.  Today try to requalify for oxygen.  We turned his oxygen off and walked him but even after 1 lap he became fatigued and wobbly and was at fall risk so we could not walk him any further.  His main issue is that he has significant  multiple symptomatology that includes significant fatigue and nonspecific abdominal pain and shortness of breath.  COPD  CAT score showed a symptom burden at 33 with fatigue at rated as very severe, sleep quality is very severe limitation in activities is very severe and dyspnea is very severe and chest tightness is very severe.  The only thing he does not have is any sputum production but he has a moderate cough.       OV 09/08/2018  Subjective:  Patient ID: [name], male , DOB: Dec 07, 1955 , age 7 y.o. , MRN: [id number] , ADDRESS: [place] [address]   09/08/2018 -   Chief Complaint  Patient presents with   Follow-up    PFT performed today. Pt states he has been worse since last visit. States he had the flu 1 week ago. Pt states his breathing is worse and has more fatigue than usual.   Interstitial lung disease with emphysema -surgical lung biopsy May 18, 2014 read in [place] as DIP with burned-out Langerhans' cell histiocytosis but reinterpreted February 2020 at [facility] as [facility] = smoking-related interstitial fibrosis associated with emphysema  Polypharmacy pain medications and heavy symptom burden  HPI [name] 66 y.o. -returns for follow-up.  He was seen approximately 1 month ago.  At that point in time we decided to re-stage and re-figure his interstitial lung disease.  I discussed his 46 pathology slide at our interstitial lung disease conference.  Our local pulmonary pathologist feels strongly that there is no evidence of Langerhan histiocytosis X.  Instead he has smoking-related interstitial fibrosis (SRIF) with associated emphysema.  On his latest high-resolution CT chest there is not much of a description of ILD even though he has crackles on exam.  Report as below and I personally visualized that CT.  He has a steady progression and worsening of pulmonary function test over 3 or 4 years as documented below.  However, he is not  hypoxemic.  He is continuing to use oxygen and is requesting oxygen for symptomatic relief of dyspnea.  However when he turned the oxygen off and walked him he is not hypoxemic at all.  His room air at rest pulse ox is 97% with heart rate of 88/min.  We walked him 2 laps and he stopped and his pulse ox dropped to 92% and his heart rate rose to 94%.  While he does drop his pulse ox by more than 3 points he is not going below 88%.  On the other hand he is becoming very symptomatic with severe shortness of breath dizziness and staggering.  Medical assistant notes that he is always nodding off.  We administered a [place] symptom assessment score and he has significant poly-symptom burden.  Medication review shows polypharmacy.  He is unable to quit any of these.    CT chest high resolution 08/11/2018 Lungs/Pleura: Postoperative changes of wedge resection are again noted in the left lower lobe and tracking along the left major fissure, similar to the prior study. However, compared to the prior examination there is greatly increased pleuroparenchymal thickening and architectural distortion in the mid to lower left hemithorax with extensive parenchymal banding throughout this region. These findings are markedly asymmetric compared to the contralateral side where there is only minimal linear scarring in the posterior aspect of the right lower lobe in the most dependent portion of the lung. High-resolution images otherwise demonstrate no widespread areas of ground-glass attenuation, septal thickening, traction bronchiectasis or honeycombing elsewhere in the lungs. Inspiratory and expiratory imaging demonstrates mild air trapping, most evident in the left lower lobe.  No acute consolidative airspace disease. No pleural effusions. Diffuse bronchial wall thickening with mild centrilobular and paraseptal emphysema.  IMPRESSION: 1. Although there are areas of increasing fibrosis throughout the mid to lower  lung, these findings are clearly asymmetric with the contralateral side and are favored to be related to developing areas of pleuroparenchymal scarring, potentially related to prior surgery. The pattern is not a usual pattern for any typical interstitial lung disease, and there is no involvement of the right lung. 2. Diffuse bronchial wall thickening with mild centrilobular and paraseptal emphysema; imaging findings compatible with the reported clinical history of underlying COPD. 3. Air trapping, most evident in the left lower lobe, indicative of small airways disease. 4. Aortic atherosclerosis, in addition to left main and 3 vessel coronary artery disease. Please note that although the presence of coronary artery calcium documents the presence of coronary artery disease, the severity of this disease and any potential stenosis cannot be assessed on this non-gated CT examination. Assessment for potential risk factor modification, dietary therapy or pharmacologic therapy may be warranted, if clinically indicated. 5. Severe hepatic steatosis. 6. Lipomatous hypertrophy of the interatrial septum.   Aortic Atherosclerosis (ICD10-I70.0) and Emphysema (ICD10-J43.9).     Electronically Signed   By: Vinnie Langton M.D.   On: 08/12/2018 08:29    OV 05/03/2019  Subjective:  Patient ID: [name], male , DOB: 06-19-1956 , age 59 y.o. , MRN: [id number] , ADDRESS: [place] [address]   Interstitial lung disease with emphysema -surgical lung biopsy May 18, 2014 read in [place] as DIP with burned-out Langerhans' cell histiocytosis but reinterpreted February 2020 at [facility] as [facility] = smoking-related interstitial fibrosis associated with emphysema  05/03/2019 -   Chief Complaint  Patient presents with   COPD with acute exacerbation    Feels his COPD has gotten worse since the televisit on 04/20/19    Fu mixed ILD and emphysema with chronic  respiratory failure    HPI [name] 66 y.o. -presents for follow-up.  Last seen by myself in early part of the year.  Since then has seen nurse practitioner 2 or 3 times through combination of physical visits and video/telephone visits.  1 of the times he is sent to the emergency department and admitted.  Last CT scan of the chest was in April 2020 and did not show any progression of his combination emphysema and ILD.  He had a blood gas at that time without any hypercapnia.  He tells me that he continues to live with his mom.  He uses between 6 and 8 L of oxygen and is stable.  He continues to have multiple symptoms including confusion early in the morning [he is noticed to be on a lot of pain meds] and chronic pain including pain in the right lateral chest.  In terms of his breathing he is stable.  Overall his quality of life and functional status is poor with an ECOG of 4.  Home palliative care used to visit with him but not anymore.  He wants the support again.  He is not interested in hospice.  He is asking for the shingles shot but we do not have this.  He will have his high-dose flu shot today.  Overall supportive care    Results for JEANLUC, WEGMAN (MRN [id number]) as of 05/03/2019 10:53  Ref. Range 11/07/2018 17:17  pH, Ven Latest Ref Range: 7.250 - 7.430  7.373  pCO2, Ven Latest Ref  Range: 44.0 - 60.0 mmHg 40.6 (L)  pO2, Ven Latest Ref Range: 32.0 - 45.0 mmHg 63.0 (H)    IMPRESSION: Chest:   1. New small focal area of peripheral ground-glass density in the anterior right upper lobe is likely infectious or inflammatory. 2. Unchanged pleuroparenchymal scarring and fibrosis in the mid to lower left hemithorax. 3.  Emphysema (ICD10-J43.9). 4.  Aortic atherosclerosis (ICD10-I70.0).   Abdomen and pelvis:   1.  No acute intra-abdominal process. 2. Hepatic steatosis. 3. Prior right nephrectomy. 4. Bilateral femoral head avascular necrosis.     Electronically Signed   By:  Titus Dubin M.D.   On: 11/07/2018 23:33     OV 06/08/2019  Subjective:  Patient ID: [name], male , DOB: 04/06/1956 , age 34 y.o. , MRN: [id number] , ADDRESS: [place] [address]    Interstitial lung disease with emphysema -surgical lung biopsy May 18, 2014 read in [place] as DIP with burned-out Langerhans' cell histiocytosis but reinterpreted February 2020 at [facility] as [facility] = smoking-related interstitial fibrosis associated with emphysema  06/08/2019 -   Chief Complaint  Patient presents with   Hospitalization Follow-up    Pt in hosp 10/30-11/1.  Pt states he has had good days and bad days since being out of the hospital. Pt states he does have alot of SOB, has an occ cough due to getting choked, and has chest tightness.      Interstitial lung disease with emphysema -surgical lung biopsy May 18, 2014 read in [place] as DIP with burned-out Langerhans' cell histiocytosis but reinterpreted February 2020 at [facility] as [facility] = smoking-related interstitial fibrosis associated with emphysema  Chronic hypoxemic respiratory failure due to the above on 6 L of oxygen at baseline  Ongoing smoking  Ongoing polypharmacy and opioid use  HPI [name] 66 y.o. -last seen mid October 2020.  Then to his end of October 2020 he got admitted with sepsis elevated lactic acid.  Diagnosed to have pneumonia based on chest x-ray and then treated with antibiotics.  Discharge November 1 second 2020 on Augmentin.  He is now back to his baseline of 6 L oxygen and is beginning to feel well.  Nevertheless overall he still feels deconditioned.  As usual he says his lungs hurt.  This visit was set up mainly in the context of his recent admission.  Admission labs reviewed and in particular he seems to have developed hypercapnia at least he was hypercapnic in the hospital.  He is no longer using his CPAP.  Unclear why.  He is interested in getting this  back.  He is interested in BiPAP as well if he qualifies.  He continues to have a large symptom burden as documented below.  Last CT scan of the chest April 2020      Results for SADIEL, MOTA (MRN [id number]) as of 09/08/2018 11:46  Ref. Range 04/13/2014 11:43 09/07/2014 11:39 09/08/2018 10:21  FVC-Pre Latest Units: L 4.37 4.03 3.80  FVC-%Pred-Pre Latest Units: % 90 83 81   Results for MAYSIN, CARSTENS (MRN [id number]) as of 09/08/2018 11:46  Ref. Range 04/13/2014 11:43 09/07/2014 11:39 09/08/2018 10:21  DLCO unc Latest Units: ml/min/mmHg 17.92 16.41 13.84  DLCO unc % pred Latest Units: % 55 50 51   Results for QUANTEZ, SCHNYDER (MRN [id number]) as of 06/08/2019 11:39  Ref. Range 05/19/2019 15:00  pH, Arterial Latest Ref Range: 7.350 - 7.450  7.346 (L)  pCO2 arterial  Latest Ref Range: 32.0 - 48.0 mmHg 52.1 (H)  pO2, Arterial Latest Ref Range: 83.0 - 108.0 mmHg 73.5 (L)    OV 10/30/2019  Subjective:  Patient ID: [name], male , DOB: [birth year] , age 51 y.o. , MRN: [id number] , ADDRESS: [address] [place] [address]   10/30/2019 -   Chief Complaint  Patient presents with   Follow-up    Pt states he believes his breathing has become worse since last visit. States when he walks, he will begin to have pain in his lungs and chest.     Interstitial lung disease with emphysema -surgical lung biopsy May 18, 2014 read in [place] as DIP with burned-out Langerhans' cell histiocytosis but [facility]interpreted February 2020 at [facility] as [facility] = smoking-related interstitial fibrosis associated with emphysema  Chronic hypoxemic respiratory failure due to the above on 6 L of oxygen at baseline  Ongoing smoking  Ongoing polypharmacy and opioid use   HPI [name] 66 y.o. -presents for follow-up.  After the last visit he had a work-up.  Blood gas did not show any hypercapnia.  High-resolution CT chest that showed emphysema.  No ILD.  His pulmonary function test though  shows decline.  Review of the chart indicates that he is in need of alpha-1 antitrypsin level checked.  He continues to have significant symptoms.  Symptom score is documented.  He says also for the last 1 month he is coughing discolored sputum that is a change from baseline.  The sputum is yellow in color.  He says cardiac etiology has been ruled out as reason for his dyspnea.  He is frustrated by his multiple symptomatology that includes pain in his breast and overall discomfort and exhaustion and tiredness.  We have struggled to fix all this.  He continues to have polypharmacy and smoking.  He lives with his mom.  PFT   Results for DONTRAY, HABERLAND (MRN [id number]) as of 10/30/2019 09:44  Ref. Range 04/13/2014 11:43 09/07/2014 11:39 09/08/2018 10:21 09/04/2019 11:01  FVC-Pre Latest Units: L 4.37 4.03 3.80 3.47  FVC-%Pred-Pre Latest Units: % 90 83 81 74  Results for MASSON, NALEPA (MRN [id number]) as of 10/30/2019 09:44  Ref. Range 04/13/2014 11:43 09/07/2014 11:39 09/08/2018 10:21 09/04/2019 11:01  DLCO unc Latest Units: ml/min/mmHg 17.92 16.41 13.84 14.15  DLCO unc % pred Latest Units: % 55 50 51 52   ROS - per HPI  IMPRESSION: 1. Negative for interstitial lung disease. 2. Interval clearing of previously seen subpleural ground-glass in the anterior segment right upper lobe. 3. Aortic atherosclerosis (ICD10-I70.0). Coronary artery calcification. 4.  Emphysema (ICD10-J43.9).     Electronically Signed   By: Lorin Picket M.D.   On: 08/30/2019 16:20    OV 02/26/2021  Subjective:  Patient ID: [name], male , DOB: [birth year] , age 40 y.o. , MRN: [id number] , ADDRESS: [place] [address] PCP [name], PA-C (Inactive) Patient Care Team: [name], PA-C (Inactive) as PCP - General (Physician Assistant) Satira Sark, MD as PCP - Cardiology (Cardiology) [name], MD as Consulting Physician (Urology) Brand Males, MD as Consulting  Physician (Pulmonary Disease) [name], Stephanie G, NP (Inactive) as Nurse Practitioner (Hospice and Palliative Medicine) Julianne Handler, NP (Inactive) as Nurse Practitioner (Hospice and Palliative Medicine) Trula Slade, DPM as Consulting Physician (Podiatry)  This Provider for this visit: Treatment Team:  Attending Provider: Brand Males, MD    02/26/2021 -  Chief Complaint  Patient presents with   Follow-up    Pt states he has been about the same since last visit. States he does have an occ cough and breathing is about the same.    Interstitial lung disease with emphysema (alpha 1 MM) -surgical lung biopsy May 18, 2014 read in [place] as DIP with burned-out Langerhans' cell histiocytosis but reinterpreted February 2020 at [facility] as [facility] = smoking-related interstitial fibrosis associated with emphysema  Chronic hypoxemic respiratory failure due to the above on 6 L of oxygen at baseline  Ongoing smoking  Ongoing polypharmacy and opioid use  Non specific chronic msk chest pain  HPI  [name] -not seeing Milledge Gerding in over a year.  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  Review of the records indicate he is always wheezing.  He continues to live with his mom.  He is continues to use 6 L of oxygen.  He is on Bevespi inhaler.  He again complains that his lungs hurt in the back.  He had a CT scan of the chest in March 2022 and its mostly emphysema.  He also had a chest x-ray in June 2022 during a hospitalization and it was clear other than atelectasis.  He points to his right upper back in the intrascapular area as the place where it hurts.  He is to have home palliative care and he benefited from it but apparently they are not coming anymore.  He wants a referral.  He is awaiting to amputation.  He uses a cane.  He has chronic pain.  He is poor quality of life.  He did have COVID in May 2022.  Overall symptom assessment is that he appears to be at  baseline Results for ARA, MANO (MRN [id number]) as of 10/30/2019 09:44  Ref. Range 06/12/2019 13:50  FIO2 Unknown 44.00  pH, Arterial Latest Ref Range: 7.350 - 7.450  7.410  pCO2 arterial Latest Ref Range: 32.0 - 48.0 mmHg 45.0  pO2, Arterial Latest Ref Range: 83.0 - 108.0 mmHg 93.1      HRCT March 2022   IMPRESSION: 1. No evidence of interstitial lung disease. Air trapping is indicative of small airways disease. 2. Pleuroparenchymal scarring and volume loss in the lower left hemithorax. 3. Aortic atherosclerosis (ICD10-I70.0). Coronary artery calcification. 4. Enlarged pulmonic trunk, indicative of pulmonary arterial hypertension. 5.  Emphysema (ICD10-J43.9).     Electronically Signed   By: Lorin Picket M.D.   On: 09/25/2020 08:19   CXR June 2022  IMPRESSION: Bibasilar atelectasis and chronic LEFT basilar scarring.   Enlargement of cardiac silhouette.   Aortic Atherosclerosis (ICD10-I70.0).     Electronically Signed   By: Lavonia Dana M.D.   On: 12/24/2020 16:18      OV 06/05/2021  Subjective:  Patient ID: [name], male , DOB: 11/16/1955 , age 30 y.o. , MRN: [id number] , ADDRESS: [place] [address] PCP Carolee Rota, NP Patient Care Team: Carolee Rota, NP as PCP - General (Nurse Practitioner) Satira Sark, MD as PCP - Cardiology (Cardiology) [name], MD as Consulting Physician (Urology) Brand Males, MD as Consulting Physician (Pulmonary Disease) [name], Stephanie G, NP (Inactive) as Nurse Practitioner (Hospice and Palliative Medicine) Julianne Handler, NP (Inactive) as Nurse Practitioner (Hospice and Palliative Medicine) Trula Slade, DPM as Consulting Physician (Podiatry)  This Provider for this visit: Treatment Team:  Attending Provider: Brand Males, MD    Interstitial lung disease with  emphysema (alpha 1 MM) -surgical lung biopsy May 18, 2014 read in [place] as DIP with  burned-out Langerhans' cell histiocytosis but reinterpreted February 2020 at [facility] as [facility] = smoking-related interstitial fibrosis associated with emphysema  Chronic hypoxemic respiratory failure due to the above on 6 L of oxygen at baseline  Ongoing smoking  Ongoing polypharmacy and opioid use  Non specific chronic msk chest pai  06/05/2021 -   Chief Complaint  Patient presents with   Follow-up     HPI [name] 66 y.o. -returns for his 11-monthfollow-up.  In the interim he has had left foot partial amputation.  He says the wound has not healed.  He is going for dressing changes.  He continues to have multiple symptoms and generally have poor quality of life.  He lives with his mom who is now 862  But the home health aide visits him and takes care of him.  He says he has had increased cough since June 2022, increase sputum and change in color since June 2022 and increased wheezing.  Because of the stress of the amputation he is smoking more.  He is now smoking half pack per day.  He feels he will benefit from antibiotic and prednisone which I gave him last time as well.  He says last time I ordered some tests on him.  I am not able to confirm that on my notes or on the review of the labs.  Nevertheless October 2022 he had new increase in creatinine.  He also had hyponatremia.  This is probably around the time of his amputation.  We need to recheck on these.  CT Chest data  No results found.     OV 12/23/2021  Subjective:  Patient ID: JJamie [name] male , DOB: 1July 29, 1957, age [age]y.o. , MRN: [id number], [address] [id number] DCarolee Rota NP Patient Care Team: DCarolee Rota NP as PCP - General (Nurse Practitioner) MSatira Sark MD as PCP - Cardiology (Cardiology) MAlexis Frock MD as Consulting Physician (Urology) RBrand Males MD as Consulting Physician (Pulmonary Disease) JMartinique Stephanie G, NP (Inactive)  as Nurse Practitioner (Hospice and Palliative Medicine) SJulianne Handler NP (Inactive) as Nurse Practitioner (Hospice and Palliative Medicine) WTrula Slade DPM as Consulting Physician (Podiatry)  This Provider for this visit: Treatment Team:  Attending Provider: RBrand Males MD    12/23/2021 -   Chief Complaint  Patient presents with   Follow-up    Pt states his lungs hurt all the time.     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 -returns for follow-up.  In April 2023 he got hospitalized for encephalopathy.  His urine tox was positive for benzos and opioids which he takes on a prescription basis.  He continues to 6 L.  He is wheezing all the time.  He is frustrated by his quality of life.  He says that he wished his natural life ended soon but  he is not actively suicidal.  Or he get some medicine to put him out of his misery.  He complains of sensitive pain around bilateral nipples when his shirt rubs against his nipples.  This is his musculoskeletal pain that he talks about his chest pain.  This is chronic.  There is no change in this.  Still lives with his mother.  There is a taking care of him.  There are no other new issues.     [place] Symptom Assessment Numerical Scale 0 is no problem -> 10 worst problem 10/14/2015   04/23/2017  08/01/2018  06/08/2019  10/30/2019  02/26/2021  12/23/2021   No Pain -> Worst pain 0 '7 9 9 7 8 7  '$ No Tiredness -> Worset  tiredness '10 9 10 10 10 10 10  '$ No Nausea -> Worst nausea 0 0 0 3 2 0 2  No Depression -> Worst depression '7 10 5 9 9 8 9  '$ No Anxiety -> Worst Anxiety '8 5 5 8 8 9 9  '$ No Drowsiness -> Worst Drowsiness 10 3 (though was nodding off) '1 9 3 3 2  '$ Best appetite-> Worst Appetitle '3 4 3 3 8 7 8  '$ Best Feeling of well being -> Worst feeling '5 7 1 4 8 7 2  '$ Cough   '8 9 10 2 8  '$ No dyspnea-> Worst dyspnea '8 8 10 10 10 8 '$ x  Other problem (none -> severe) 10 - chest discomfort, when active 7 leg pain 10 10 chest discomfort and other miscellaneous  Lungs  hurt in the back at time patient  Completed by  patient patient patient patient  pateient                PFT     Latest Ref Rng & Units 09/04/2019   11:01 AM 09/08/2018   10:21 AM 09/07/2014   11:39 AM 04/13/2014   11:43 AM  PFT Results  FVC-Pre L 3.47   3.80   4.03   4.37    FVC-Predicted Pre % 74   81   83   90    FVC-Post L   4.29   4.43    FVC-Predicted Post %   89   92    Pre FEV1/FVC % % 77   76   81   82    Post FEV1/FCV % %   83   83    FEV1-Pre L 2.68   2.88   3.28   3.58    FEV1-Predicted Pre % 77   82   90   97    FEV1-Post L   3.58   3.67    DLCO uncorrected ml/min/mmHg 14.15   13.84   16.41   17.92    DLCO UNC% % 52   51   50   55    DLVA Predicted % 71   62   57   63    TLC L   6.36   6.11    TLC % Predicted %   90   87    RV % Predicted %   85   76         has a past medical history of Anxiety, Arthritis, Cholecystitis, acute (12/20/2013), Cocaine abuse ([name]), COPD (chronic obstructive pulmonary disease) ([place]), Coronary atherosclerosis of native coronary artery, Depression, Essential hypertension, GERD (gastroesophageal reflux disease), Headache, Hepatitis (Late 1970s), History of complete ray amputation of first toe of left foot ([place]) (04/24/2021),  History of nephrolithiasis, History of pneumonia, History of stroke, Hyperlipidemia, Noncompliance, Osteomyelitis of second toe of left foot ([place]) (04/24/2021), Renal cell carcinoma  ([place]), Sleep apnea, and Type 2 diabetes mellitus ([place]) (2011).   reports that he has been smoking cigarettes. He started smoking about 49 years ago. He has a 41.00 pack-year smoking history. He has never been exposed to tobacco smoke. He has never used smokeless tobacco.  Past Surgical History:  Procedure Laterality Date   ABDOMINAL AORTOGRAM W/LOWER EXTREMITY Left 07/17/2021   Procedure: ABDOMINAL AORTOGRAM W/LOWER EXTREMITY;  Surgeon: Serafina Mitchell, MD;  Location: [place] [facility];  Service: Cardiovascular;  Laterality: Left;   AMPUTATION Left 12/27/2020   Procedure: PARTIAL FIRST RAY AMPUTATION OF FOOT;  Surgeon: Felipa Furnace, DPM;  Location: [place];  Service: Podiatry;  Laterality: Left;   APPENDECTOMY  1970's   CHOLECYSTECTOMY N/A 12/22/2013   Procedure: LAPAROSCOPIC CHOLECYSTECTOMY ;  Surgeon: [name], MD;  Location: [place];  Service: General;  Laterality: N/A;   ENDARTERECTOMY Right 10/08/2014   Procedure: Right ENDARTERECTOMY CAROTID;  Surgeon: [name], MD;  Location: [place];  Service: Vascular;  Laterality: Right;   LAPAROSCOPIC LYSIS OF ADHESIONS  02/21/2014   Procedure: LAPAROSCOPIC LYSIS OF ADHESIONS EXTINSIVE;  Surgeon: [name], MD;  Location: [facility];  Service: Urology;;   LEFT HEART CATHETERIZATION WITH CORONARY ANGIOGRAM N/A 06/02/2012   Procedure: LEFT HEART CATHETERIZATION WITH CORONARY ANGIOGRAM;  Surgeon: [name], MD;  [facility];  Service: Cardiovascular;  Laterality: N/A;   LEFT HEART CATHETERIZATION WITH CORONARY ANGIOGRAM N/A 03/09/2013   Procedure: LEFT HEART CATHETERIZATION WITH CORONARY ANGIOGRAM;  Surgeon: [name], MD;  Location: [facility] CATH LAB;  Service: Cardiovascular;  Laterality: N/A;   LEFT HEART CATHETERIZATION WITH CORONARY ANGIOGRAM N/A 12/20/2013   Procedure: LEFT HEART CATHETERIZATION WITH CORONARY ANGIOGRAM;  Surgeon: [name], MD;  Location: [facility] C[facility] LAB;  Service: Cardiovascular;  Laterality: N/A;    LUNG BIOPSY Left 05/18/2014   Procedure: LUNG BIOPSY left upper lobe & left lower lobe;  Surgeon: [name], MD;  Location: [place];  Service: Thoracic;  Laterality: Left;   PATCH ANGIOPLASTY Right 10/08/2014   Procedure: PATCH ANGIOPLASTY Right Carotid;  Surgeon: [name], MD;  Location: [place];  Service: Vascular;  Laterality: Right;   PERIPHERAL VASCULAR INTERVENTION Left 07/17/2021   Procedure: PERIPHERAL VASCULAR INTERVENTION;  Surgeon: Serafina Mitchell, MD;  Location: [place] [facility];  Service: Cardiovascular;  Laterality: Left;  Superficial Femoral Artery   ROBOT ASSISTED LAPAROSCOPIC NEPHRECTOMY Right 02/21/2014   Procedure: ROBOTIC ASSISTED LAPAROSCOPIC RIGHT NEPHRECTOMY ;  Surgeon: [name], MD;  Location: [facility];  Service: Urology;  Laterality: Right;   VIDEO ASSISTED THORACOSCOPY Left 05/18/2014   Procedure: LEFT VIDEO ASSISTED THORACOSCOPY;  Surgeon: [name], MD;  Location: [place];  Service: Thoracic;  Laterality: Left;   VIDEO BRONCHOSCOPY  05/18/2014   Procedure: VIDEO BRONCHOSCOPY;  Surgeon: [name], MD;  Location: [place];  Service: Thoracic;;    Allergies  Allergen Reactions   Lisinopril Other (See Comments)    Other reaction(s): worsening kidney function   Pollen Extract     Other reaction(s): sneezing   Oxycodone Itching and Nausea Only    Pt is able to tolerate if taken with benadryl    Immunization History  Administered Date(s) Administered   Fluad Quad(high Dose 65+) 05/03/2019   Influenza Split 04/04/2014, 03/27/2015   Influenza, High Dose Seasonal  PF 05/03/2019, 07/29/2020, 07/29/2021   Influenza,inj,quad, With Preservative 04/02/2014, 03/19/2015   Moderna Sars-Covid-2 Vaccination 03/07/2020, 04/04/2020   Pneumococcal Polysaccharide-23 11/04/2012, 02/04/2021   Zoster, Live 02/05/2020, 05/09/2020    Family History  Problem Relation Age of Onset   Cancer Mother        Thyroid - living in her 77's.   Hypertension  Other    Diabetes Other    Stroke Other    Lung cancer Father    CAD Father    Cancer Maternal Grandmother        Breast   Cancer Maternal Grandfather        Throat and stomach   CAD Brother      Current Outpatient Medications:    acetaminophen (TYLENOL) 325 MG tablet, Take 650 mg by mouth See admin instructions. Take 650 mg every 3-4 hours as needed for pain., Disp: , Rfl:    ALPRAZolam (XANAX) 1 MG tablet, Take 1 mg by mouth 3 (three) times daily as needed., Disp: , Rfl:    atorvastatin (LIPITOR) 20 MG tablet, Take 20 mg by mouth at bedtime., Disp: , Rfl: 1   Budeson-Glycopyrrol-Formoterol (BREZTRI AEROSPHERE) 160-9-4.8 MCG/ACT AERO, Inhale 2 puffs into the lungs in the morning and at bedtime., Disp: 10.7 g, Rfl: 5   budesonide (PULMICORT) 1 MG/2ML nebulizer solution, Take 1 mg by nebulization in the morning, at noon, and at bedtime., Disp: , Rfl:    clopidogrel (PLAVIX) 75 MG tablet, TAKE ONE TABLET BY MOUTH EVERY DAY (Patient taking differently: Take 75 mg by mouth daily.), Disp: 90 tablet, Rfl: 2   diltiazem (CARDIZEM CD) 180 MG 24 hr capsule, TAKE ONE CAPSULE BY MOUTH EVERY DAY (Patient taking differently: Take 180 mg by mouth daily.), Disp: 90 capsule, Rfl: 2   diphenhydrAMINE (BENADRYL) 25 mg capsule, Take 50 mg by mouth at bedtime., Disp: , Rfl:    esomeprazole (NEXIUM) 20 MG capsule, Take 20 mg by mouth daily at 12 noon., Disp: , Rfl:    FLUoxetine (PROZAC) 20 MG capsule, Take 20 mg by mouth daily., Disp: , Rfl:    FLUoxetine (PROZAC) 40 MG capsule, Take 40 mg by mouth daily., Disp: , Rfl:    formoterol (PERFOROMIST) 20 MCG/2ML nebulizer solution, Take 20 mcg by nebulization as needed (shortness of breath)., Disp: , Rfl:    gabapentin (NEURONTIN) 300 MG capsule, Take 900 mg by mouth 3 (three) times daily., Disp: , Rfl:    glucose blood (TRUE METRIX BLOOD GLUCOSE TEST) test strip, , Disp: , Rfl:    insulin regular human CONCENTRATED (HUMULIN R) 500 UNIT/ML injection, Inject 90  Units into the skin 3 (three) times daily with meals., Disp: , Rfl:    isosorbide mononitrate (IMDUR) 60 MG 24 hr tablet, TAKE ONE TABLET BY MOUTH EVERY DAY (Patient taking differently: Take 60 mg by mouth daily.), Disp: 90 tablet, Rfl: 1   linagliptin (TRADJENTA) 5 MG TABS tablet, Take 5 mg by mouth daily., Disp: , Rfl:    nitroGLYCERIN (NITROSTAT) 0.4 MG SL tablet, Place 1 tablet (0.4 mg total) under the tongue every 5 (five) minutes x 3 doses as needed for chest pain (if no relief after 2nd dose, proceed to ED or call 911)., Disp: 25 tablet, Rfl: 3   oxyCODONE-acetaminophen (PERCOCET) 10-325 MG tablet, Take 1 tablet by mouth daily as needed for pain., Disp: , Rfl:    potassium chloride SA (K-DUR,KLOR-CON) 20 MEQ tablet, Take 20 mEq by mouth 2 (two) times daily. , Disp: , Rfl:  pregabalin (LYRICA) 25 MG capsule, Take 25 mg by mouth 2 (two) times daily., Disp: , Rfl:    Probiotic Product (PROBIOTIC PO), Take 1 capsule by mouth daily., Disp: , Rfl:    tamsulosin (FLOMAX) 0.4 MG CAPS capsule, Take 0.4 mg by mouth daily. , Disp: , Rfl:    torsemide (DEMADEX) 20 MG tablet, TAKE THREE TABLETS BY MOUTH TWICE DAILY (Patient taking differently: Take 60 mg by mouth 2 (two) times daily.), Disp: 540 tablet, Rfl: 0   zolpidem (AMBIEN) 5 MG tablet, Take 5 mg by mouth at bedtime. For insomnia., Disp: , Rfl:       Objective:   Vitals:   12/23/21 1411  BP: 128/72  Pulse: (!) 108  SpO2: 97%  Weight: 222 lb 9.6 oz (101 kg)  Height: '5\' 8"'$  (1.727 m)    Estimated body mass index is 33.85 kg/m as calculated from the following:   Height as of this encounter: '5\' 8"'$  (1.727 m).   Weight as of this encounter: 222 lb 9.6 oz (101 kg).  '@WEIGHTCHANGE'$ @  [employer]   12/23/21 1411  Weight: 222 lb 9.6 oz (101 kg)     Physical Exam    General: No distress. Looks well Neuro: Alert and Oriented x 3. GCS 15. Speech normal Psych: Pleasant Resp:  Barrel Chest - n.  Wheeze - YES dIFFUSE, Crackles - no, No  overt respiratory distress CVS: Normal heart sounds. Murmurs - no Ext: Stigmata of Connective Tissue Disease - no HEENT: Normal upper airway. PEERL +. No post nasal drip   Has cane Obese Deconditoned     Assessment:       ICD-10-CM   1. Chronic obstructive pulmonary disease, unspecified COPD type ([place])  J44.9     2. Chronic respiratory failure with hypoxia ([name])  J96.11     3. Other emphysema ([place])  J43.8     4. Tobacco abuse  Z72.0     5. Wheezing  R06.2          Plan:     Patient Instructions     ICD-10-CM   1. Chronic obstructive pulmonary disease, unspecified COPD type ([place])  J44.9     2. Chronic respiratory failure with hypoxia ([name])  J96.11     3. Other emphysema ([place])  J43.8     4. Tobacco abuse  Z72.0     5. Chronic kidney disease, unspecified CKD stage  N18.9        On your latest CT scan there is no more evidence of interstitial lung disease/pulmonary fibrosis. Instead the dominant feature is emphysema  Currently might be in a bit of COPD flareup or emphysema flareup- but could just be that you have baseline wheezing  Overall shortness of breath is multifactorial and this includes emphysema, weight, physical deconditioning and other reasons    Plan - stop smoking -- Please take prednisone 40 mg x1 day, then 30 mg x1 day, then 20 mg x1 day, then 10 mg x1 day, and then 5 mg x1 day and stop ---Continue oxygen as before -Contnue BREZTRI inhaler  2 puff twice daily =- Other prn nebulizers  as before     Follow-up -6 months or sooner if needed  - ESAS symptom score at followup    SIGNATURE    Dr. Brand Males, M.D., F.C.C.P,  Pulmonary and Critical Care Medicine Staff Physician, [place] Director - Interstitial Lung Disease  Program  Pulmonary [place] [facility] [place]  Pulmonary [place], [place], [address]  Pager: [phone], If no answer or between  15:00h - 7:00h: call [phone] Telephone: [phone]  2:38 PM 12/23/2021

## 2021-12-25 ENCOUNTER — Other Ambulatory Visit: Payer: Self-pay | Admitting: Cardiology

## 2022-01-06 ENCOUNTER — Other Ambulatory Visit: Payer: Medicare Other | Admitting: Family Medicine

## 2022-01-06 ENCOUNTER — Encounter: Payer: Self-pay | Admitting: Family Medicine

## 2022-01-06 VITALS — BP 120/68 | HR 81 | Resp 22 | Wt 222.6 lb

## 2022-01-06 DIAGNOSIS — J9611 Chronic respiratory failure with hypoxia: Secondary | ICD-10-CM

## 2022-01-06 DIAGNOSIS — Z515 Encounter for palliative care: Secondary | ICD-10-CM

## 2022-01-06 DIAGNOSIS — Z905 Acquired absence of kidney: Secondary | ICD-10-CM | POA: Insufficient documentation

## 2022-01-06 DIAGNOSIS — R9341 Abnormal radiologic findings on diagnostic imaging of renal pelvis, ureter, or bladder: Secondary | ICD-10-CM | POA: Insufficient documentation

## 2022-01-06 DIAGNOSIS — R079 Chest pain, unspecified: Secondary | ICD-10-CM
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
## 2022-01-07 ENCOUNTER — Telehealth: Payer: Self-pay | Admitting: Family Medicine

## 2022-01-07 DIAGNOSIS — G894 Chronic pain syndrome: Secondary | ICD-10-CM | POA: Diagnosis not present

## 2022-01-07 DIAGNOSIS — R42 Dizziness and giddiness: Secondary | ICD-10-CM | POA: Diagnosis not present

## 2022-01-07 NOTE — Telephone Encounter (Signed)
Received message from [place] with Dr Domenic Polite, Cardiology.  She states to have pt add Imdur 30 mg evening dose to his 60 mg am dose.  Left message on pt's cell phone that Cardiology recommended addition of an Imdur 30 mg evening dose in addition to the 60 mg am dose.  Sent prescription for 30 mg to [facility].  Notified [facility] office who had already seen pt today that Cardiology recommended an increase in Imdur dose with the addition of a 30 mg in the evening.  Sent in RX to [facility] for Isosorbid mononitrate 24 hrs 30 mg by mouth in the evening, disp # 90 with 1 refill.  Damaris Hippo FNP-C

## 2022-01-21 ENCOUNTER — Other Ambulatory Visit: Payer: Self-pay | Admitting: Cardiology

## 2022-01-26 ENCOUNTER — Telehealth: Payer: Self-pay | Admitting: Family Medicine

## 2022-01-26 NOTE — Telephone Encounter (Signed)
On call received call from patient over the weekend stating that his right leg is more swollen than his left, "my lungs are hurting and I am coughing up this nasty brown phlegm."  Pt states he continues to drink fluids but has no interest in eating and cannot get OOB.  He has no pain in the leg and cannot feel his feet.  He had been having chest pain and dose of Imdur had been increased at last visit. His blood sugars are running 300-400. He does not want to go to the hospital and states "I am really tired."  Pt denies that there is blood in his sputum and states slight increase in heat in RLE but this has gone down some overnight.  Pt states he is ok with referral to [facility].  PPS is now 20. TCT Karren Cobble, NP with [facility] who has been seeing pt since Carolee Rota NP left the practice. Left message to ask if NP agreed that the pt appears to have a prognosis of 6 months or less if his disease follows the current course and if she would be willing to remain as attending.  Left number for call back. Damaris Hippo FNP-C

## 2022-01-26 NOTE — Telephone Encounter (Incomplete Revision)
On call received call from patient over the weekend stating that his right leg is more swollen than his left, "my lungs are hurting and I am coughing up this nasty brown phlegm."  Pt states he continues to drink fluids but has no interest in eating and cannot get OOB.  He has no pain in the leg and cannot feel his feet.  He had been having chest pain and dose of Imdur had been increased at last visit. His blood sugars are running 300-400. He does not want to go to the hospital and states "I am really tired."  Pt denies that there is blood in his sputum and states slight increase in heat in RLE but this has gone down some overnight.  Pt states he is ok with referral to [facility].  PPS is now 20. TCT Karren Cobble, NP with [facility] who has been seeing pt since Carolee Rota NP left the practice. Left message to ask if NP agreed that the pt appears to have a prognosis of 6 months or less if his disease follows the current course and if she would be willing to remain as attending.  Left number for call back. Damaris Hippo FNP-C  01/23/22 1:45 PM Sent RX for Doxycycline 100 mg po BID x 10 days for COPD exacerbation with likely CAP.  Pt has hx of multisubstance abuse and review of PDMP indicates he just filled a Rx of OxyCodone 10/325 mg po Q 6 hrs prn, 120 pills on 01/23/22.  He also has recent prescriptions for Pregabalin, Ambien and Alprazolam.  Ordinarily would send in Prednisone but concern for severe hyperglycemia already and hx of osteomyelitis.  Damaris Hippo FNP-C

## 2022-01-27 ENCOUNTER — Other Ambulatory Visit: Payer: Medicare Other | Admitting: Family Medicine

## 2022-01-27 ENCOUNTER — Encounter: Payer: Self-pay | Admitting: Family Medicine

## 2022-01-27 VITALS — BP 98/60 | HR 77 | Resp 24

## 2022-01-27 DIAGNOSIS — I5032 Chronic diastolic (congestive) heart failure: Secondary | ICD-10-CM

## 2022-01-27 DIAGNOSIS — R140 Abdominal distension (gaseous): Secondary | ICD-10-CM | POA: Diagnosis not present

## 2022-01-27 DIAGNOSIS — Z515 Encounter for palliative care: Secondary | ICD-10-CM

## 2022-01-27 DIAGNOSIS — E1165 Type 2 diabetes mellitus with hyperglycemia: Secondary | ICD-10-CM

## 2022-01-27 DIAGNOSIS — J9611 Chronic respiratory failure with hypoxia: Secondary | ICD-10-CM | POA: Diagnosis not present

## 2022-01-27 DIAGNOSIS — J441 Chronic obstructive pulmonary disease with (acute) exacerbation: Secondary | ICD-10-CM

## 2022-01-27 NOTE — Progress Notes (Unsigned)
Therapist, nutritional Palliative Care Consult Note Telephone: [phone]  Fax: [phone]    Date of encounter: 01/27/22 4:42 PM PATIENT NAME: [name] [address] [place] [address]   [phone] (home)  DOB: 20-Dec-1955 MRN: [id number] PRIMARY CARE PROVIDER:    Mitzi Hansen, NP,  [address] [place] [address] [phone]  REFERRING PROVIDER:   Mitzi Hansen, NP [address] [place],  [place] [address] [phone]  RESPONSIBLE PARTY:    Contact Information     Name Relation Home Work Mobile   [name] Mother [phone]  [phone]   [name] [phone]  [phone]   [name]   [phone]        I met face to face with patient and family in *** home/facility. Palliative Care was asked to follow this patient by consultation request of  Mitzi Hansen, NP to address advance care planning and complex medical decision making. This is a follow up visit.                                   ASSESSMENT AND PLAN / RECOMMENDATIONS:   Advance Care Planning/Goals of Care: Goals include to maximize quality of life and symptom management. Patient/health care surrogate gave his/her permission to discuss. Our advance care planning conversation included a discussion about:    The value and importance of advance care planning  Experiences with loved ones who have been seriously ill or have died  Exploration of personal, cultural or spiritual beliefs that might influence medical decisions  Exploration of goals of care in the event of a sudden injury or illness  Identification of a healthcare agent  Review and updating or creation of an  advance directive document . Decision not to resuscitate or to de-escalate disease focused treatments due to poor prognosis. CODE STATUS:  Symptom Management/Plan:    Follow up Palliative Care Visit: Palliative care will continue to follow for complex medical  decision making, advance care planning, and clarification of goals. Return *** weeks or prn.  I spent *** minutes providing this consultation. More than 50% of the time in this consultation was spent in counseling and care coordination.  This visit was coded based on medical decision making (MDM).***  PPS: ***0%  HOSPICE ELIGIBILITY/DIAGNOSIS: TBD  Chief Complaint: ***  HISTORY OF PRESENT ILLNESS:  [name] is a 66 y.o. year old male  with *** .   90 units U-500 TID, 35 units before meals TID. Not eating well, drinking.  Taking Tradjenta.  Has some swelling on left side of abdomen.  Eating minimal meals but drinking well.  Not making much urine even though taking Torsemide 60 mg BID.  He has not started the Imdur 30 mg dose in the evening.  States his lungs hurt.  He has had orthopnea, PND and wheezing "all the time".  Denies fever, nausea and vomiting.   History obtained from review of EMR, discussion with primary team, and interview with family, facility staff/caregiver and/or [name].  I reviewed available labs, medications, imaging, studies and related documents from the EMR.  Records reviewed and summarized above.   ROS  General: NAD Cardiovascular: endorses "lung pain" different from chest pain, endorses DOE Pulmonary: endorses cough but has a lot of brown sputum production in the am not as much the rest of the day, endorses increased SOB and wheezing, worsening orthopnea the  past 2 weeks Abdomen: endorses poor appetite, denies constipation, endorses continence of bowel GU: denies dysuria, endorses continence of urine MSK:  endorses increased weakness and fatigue,  no falls reported Skin: has slight redness of distal third of right foot, no heat and has no sensation Neurological: endorses pain, some insomnia Psych: Endorses depressed mood   Physical Exam: Current and past weights: Constitutional: NAD General: frail appearing, thin/WNWD/obese  EYES: anicteric sclera,  lids intact, no discharge  ENMT: intact hearing, oral mucous membranes moist, dentition intact CV: S1S2, RRR, no LE edema Pulmonary: LCTA, no increased work of breathing, no cough, room air Abdomen: intake 100%, normo-active BS + 4 quadrants, soft and non tender, no ascites GU: deferred MSK: no sarcopenia, moves all extremities, ambulatory Skin: warm and dry, no rashes or wounds on visible skin Neuro:  no generalized weakness,  no cognitive impairment Psych: non-anxious affect, A and O x 3 Hem/lymph/immuno: no widespread bruising   Thank you for the opportunity to participate in the care of [name].  The palliative care team will continue to follow. Please call our office at [phone] if we can be of additional assistance.   Marijo Conception, FNP   COVID-19 PATIENT SCREENING TOOL Asked and negative response unless otherwise noted:   Have you had symptoms of covid, tested positive or been in contact with someone with symptoms/positive test in the past 5-10 days?

## 2022-01-27 NOTE — Progress Notes (Incomplete)
[facility], [facility] Palliative Care Consult Note Telephone: [phone]  Fax: [phone]    Date of encounter: 01/27/22 4:42 PM PATIENT NAME: [name] [address] [place] [address]   [phone] (home)  DOB: 05/05/1956 MRN: [id number] PRIMARY CARE PROVIDER:    Carolee Rota, NP,  [place] [address] [place] [address] [phone]  Karren Cobble, NP [facility]  REFERRING PROVIDER:   Carolee Rota, [place] [address] [place],  [place] [address] [phone]  RESPONSIBLE PARTY:    Contact Information     Name Relation Home Work Mobile   Ardoin,[name] Mother [phone]  [phone]   Apolonio Schneiders [phone]  [phone]   Isaiah Blakes   [phone]        I met face to face with patient in his home. Palliative Care was asked to follow this patient by consultation request of  Karren Cobble, NP to address advance care planning and complex medical decision making. This is a follow up visit.                                   ASSESSMENT, SYMPTOM MANAGEMENT AND PLAN / RECOMMENDATIONS:   Advance Care Planning/Goals of Care: Goals include to maximize quality of life and symptom management. Patient gave his permission to discuss with him but did not want his mother to know. Our advance care planning conversation included a discussion about:    Exploration of personal, cultural or spiritual beliefs that might influence medical decisions-He states previously when on Hospice service he was kept "so comfortable I didn't know I was in the world." He lives with his mother and "we are all each other has so I worry about leaving her here with no one and she was really upset about my talking about Hospice."  Pt states "I'm really tired and ready to go." Decision not to resuscitate or to de-escalate disease focused treatments due to poor prognosis. CODE STATUS: MOST as of 10/30/2015: DNR/DNI with limited  additional intervention Use of antibiotics  and IV fluids if indicated Feeding tube for a defined trial period     Follow up Palliative Care Visit: Palliative care will continue to follow for complex medical decision making, advance care planning, and clarification of goals. Return 2 weeks or prn.   This visit was coded based on medical decision making (MDM).  PPS: 40%  HOSPICE ELIGIBILITY/DIAGNOSIS: TBD  Chief Complaint: Acute visit for c/o "my lungs hurt and I'm coughing up this nasty brown phlegm".  HISTORY OF PRESENT ILLNESS:  [name] is a 66 y.o. year old male with chronic respiratory failure  due to COPD, ILD and pulmonary fibrosis. 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  He also has hx of polysubstance abuse with tobacco and cocaine.  Pt states in the last 2 weeks he has had increasing DOE, PND and orthopnea.  He has heard himself wheezing and where usually cough productive of white phlegm he now has production of "nasty brown phlegm" more in the am but continues in smaller amounts throughout the day.  Denies fever, nausea and vomiting.  Has had poor appetite but continues to drink fluids.  States  he has had decreased urine output despite use of Torsemide 60 mg BID.  He continues to have some chest pain which he calls "pain in my lungs", denies blood in his sputum.  He c/o debilitating fatigue and weakness. He states he has a shower chair, can shower but then has to go to bed for a while.  He has increased his nighttime O2 from 6 to 8 L to be more comfortable.  He does have prescription for Oxycodone 10/325 mg and states this does help some with his pain.   90 units U-500 TID, 35 units before meals TID. Not eating well, drinking.  Taking  Tradjenta.  Has some swelling on left side of abdomen.  Eating minimal meals but drinking well.  Not making much urine even though taking Torsemide 60 mg BID.  He has not started the Imdur 30 mg dose in the evening.  States his lungs hurt.  He has had orthopnea, PND and wheezing "all the time".  Denies fever, nausea and vomiting.   History obtained from review of EMR, discussion with primary team, and interview with family, facility staff/caregiver and/or [name].  I reviewed available labs, medications, imaging, studies and related documents from the EMR.  Records reviewed and summarized above.   ROS  General: NAD Cardiovascular: endorses "lung pain" different from chest pain, endorses DOE Pulmonary: endorses cough but has a lot of brown sputum production in the am not as much the rest of the day, endorses increased SOB and wheezing, worsening orthopnea the past 2 weeks Abdomen: endorses poor appetite, denies constipation, endorses continence of bowel GU: denies dysuria, endorses continence of urine MSK:  endorses increased weakness and fatigue,  no falls reported Skin: has slight redness of distal third of right foot, no heat and has no sensation Neurological: endorses pain, some insomnia Psych: Endorses depressed mood   Physical Exam: Current and past weights: Constitutional: NAD General: frail appearing, thin/WNWD/obese  EYES: anicteric sclera, lids intact, no discharge  ENMT: intact hearing, oral mucous membranes moist, dentition intact CV: S1S2, RRR, no LE edema Pulmonary: LCTA, no increased work of breathing, no cough, room air Abdomen: intake 100%, normo-active BS + 4 quadrants, soft and non tender, no ascites GU: deferred MSK: no sarcopenia, moves all extremities, ambulatory Skin: warm and dry, no rashes or wounds on visible skin Neuro:  no generalized weakness,  no cognitive impairment Psych: non-anxious affect, A and O x 3 Hem/lymph/immuno: no widespread  bruising   Thank you for the opportunity to participate in the care of [name].  The palliative care team will continue to follow. Please call our office at [phone] if we can be of additional assistance.   Marijo Conception, FNP   COVID-19 PATIENT SCREENING TOOL Asked and negative response unless otherwise noted:   Have you had symptoms of covid, tested positive or been in contact with someone with symptoms/positive test in the past 5-10 days?

## 2022-01-29 ENCOUNTER — Telehealth: Payer: Self-pay | Admitting: Family Medicine

## 2022-01-29 NOTE — Telephone Encounter (Signed)
TCT pt and offered to have SW contact him for counseling and to discuss beginning planning for care for his mother to help alleviate the caregiver burden.  Offered to speak with pt's mother's PCP regarding a referral for her for Palliative. Advised that [facility] could help support them both if he thought she might be willing.  He states he will discuss it with her and let me know.  Damaris Hippo FNP-C

## 2022-02-02 DIAGNOSIS — I959 Hypotension, unspecified: Secondary | ICD-10-CM | POA: Diagnosis not present

## 2022-02-02 DIAGNOSIS — R079 Chest pain, unspecified: Secondary | ICD-10-CM | POA: Diagnosis not present

## 2022-02-02 DIAGNOSIS — E785 Hyperlipidemia, unspecified: Secondary | ICD-10-CM | POA: Diagnosis not present

## 2022-02-02 DIAGNOSIS — J449 Chronic obstructive pulmonary disease, unspecified: Secondary | ICD-10-CM | POA: Diagnosis not present

## 2022-02-02 DIAGNOSIS — Z515 Encounter for palliative care: Secondary | ICD-10-CM | POA: Diagnosis not present

## 2022-02-02 DIAGNOSIS — E1140 Type 2 diabetes mellitus with diabetic neuropathy, unspecified: Secondary | ICD-10-CM | POA: Diagnosis not present

## 2022-02-02 DIAGNOSIS — I25110 Atherosclerotic heart disease of native coronary artery with unstable angina pectoris: Secondary | ICD-10-CM | POA: Diagnosis not present

## 2022-02-03 ENCOUNTER — Other Ambulatory Visit: Payer: Medicare Other | Admitting: Family Medicine

## 2022-02-05 DIAGNOSIS — E1140 Type 2 diabetes mellitus with diabetic neuropathy, unspecified: Secondary | ICD-10-CM | POA: Diagnosis not present

## 2022-02-05 DIAGNOSIS — Z794 Long term (current) use of insulin: Secondary | ICD-10-CM | POA: Diagnosis not present

## 2022-02-11 ENCOUNTER — Other Ambulatory Visit: Payer: Medicare Other | Admitting: Family Medicine

## 2022-02-11 ENCOUNTER — Encounter: Payer: Self-pay | Admitting: Family Medicine

## 2022-02-11 VITALS — BP 126/56 | HR 64

## 2022-02-11 DIAGNOSIS — I5032 Chronic diastolic (congestive) heart failure: Secondary | ICD-10-CM | POA: Diagnosis not present

## 2022-02-11 DIAGNOSIS — J9611 Chronic respiratory failure with hypoxia: Secondary | ICD-10-CM

## 2022-02-11 DIAGNOSIS — J441 Chronic obstructive pulmonary disease with (acute) exacerbation: Secondary | ICD-10-CM

## 2022-02-11 DIAGNOSIS — B370 Candidal stomatitis: Secondary | ICD-10-CM | POA: Diagnosis not present

## 2022-02-11 DIAGNOSIS — I2510 Atherosclerotic heart disease of native coronary artery without angina pectoris: Secondary | ICD-10-CM

## 2022-02-11 DIAGNOSIS — E1165 Type 2 diabetes mellitus with hyperglycemia: Secondary | ICD-10-CM

## 2022-02-11 NOTE — Progress Notes (Signed)
Therapist, nutritional Palliative Care Consult Note Telephone: [phone]  Fax: [phone]    Date of encounter: 02/11/22 9:27 AM PATIENT NAME: [name] [address] [place] [address]   [phone] (home)  DOB: Sep 19, 1955 MRN: [id number] PRIMARY CARE PROVIDER:   Kae Heller, NP [facility]  REFERRING PROVIDER:   Mitzi Hansen, NP [address] [place],  [place] [address] [phone]  RESPONSIBLE PARTY:    Contact Information     Name Relation Home Work Mobile   Amore,[name] Mother [phone]  [phone]   Charleen Kirks [phone]  [phone]   Theophilus Bones   [phone]        I met face to face with patient in his home. Palliative Care was asked to follow this patient by consultation request of Kae Heller, NP to address advance care planning and complex medical decision making. This is a follow up visit.                                   ASSESSMENT, SYMPTOM MANAGEMENT AND PLAN / RECOMMENDATIONS:  Fluconazole 100 mg daily x7, 4 more days Doxycycline.  COPD with acute exacerbation and acute on chronic respiratory failure Started pt on Doxycycline 100 mg po BID with completion of 10 day course tomorrow.  Continues to be symptomatic, will give additional 4 days of Doxycycline. Continue O2@6L  during the day and 8 L at HS.  Hyperglycemia due to DM Advised pt to begin rotating sites to see if this would improve absorption of insulin. Continue Tradjenta daily. Poorly controlled but some improvement with rotation of sites  Chronic diastolic heart failure Improved edema, less symptomatic at rest/with exertion Continue Torsemide 60 mg BID  4.   CAD in native artery Decrease in unstable angina with change to Imdur 60 mg daily and 30 mg QHS No recent need for NTG  5.   Oropharyngeal candidiasis Blood sugars poorly controlled, recent antibiotics and brown phlegm-will treat with a 7 day  course of Fluconazole at end of antibiotic treatment.  Advance Care Planning/Goals of Care: Goals include to maximize quality of life and symptom management.  Our advance care planning conversation included a discussion about:    Exploration of personal, cultural or spiritual beliefs that might influence medical decisions-He states previously when on Hospice service he was kept "so comfortable I didn't know I was in the world." Brother states he is not ready to give up driving Decision not to resuscitate or to de-escalate disease focused treatments due to poor prognosis. CODE STATUS: MOST as of 10/30/2015: DNR/DNI with limited additional intervention Use of antibiotics  and IV fluids if indicated Feeding tube for a defined trial period     Follow up Palliative Care Visit: Palliative care will continue to follow for complex medical decision making, advance care planning, and clarification of goals. Return 4 weeks or prn.   This visit was coded based on medical decision making (MDM).  PPS: 40%  HOSPICE ELIGIBILITY/DIAGNOSIS: TBD  Chief Complaint: Acute follow up visit to reassess treatment of recent COPD exacerbation, chest pain and heart failure.  HISTORY OF PRESENT ILLNESS:  [name] is a 66 y.o. year old male with chronic respiratory failure  due to COPD, ILD and pulmonary fibrosis. He has poorly controlled type 2 IDDM, HTN, CAD, carotid artery stenosis, chronic diastolic heart failure ,OSA, severe PVD with peripheral neuropathy, a hx  of right renal carcinoma s/p nephrectomy and diabetic foot ulcer with amputation of distal portion of left foot, CKD and BPH.  He also has hx of polysubstance abuse with tobacco and cocaine.  Pt states he is eating at least 1 meal per day.  He continues to have pain in his lungs and has continued cough productive of brown phlegm but states some improvement in sx with Doxycycline which he will complete tomorrow. He states no change in productive brown  phlegm, denies sore throat or pain with swallowing. States he avoids coughing as much as possible due to the pain generated in his lungs.  He continues wheezing. Has orthopnea and PND. His most recent weight was 233 lbs with "a lot of swelling".  States he has had decreased urine output despite use of Torsemide 60 mg BID.  Since increased Imdur he has not had chest pain or had to take NTG.  He c/o debilitating fatigue and weakness, significant DOE.  He has increased his nighttime O2 from 6 to 8 L to be more comfortable.   He states his blood sugar today was 277 which is lower for him but continues to run some 400s. He continues taking 90 units U-500 TID, 35 units short acting insulin before meals TID. He is routinely rotating his sites. Continues on Tradjenta daily.  Eating well at least one meal per day and drinking well.   Denies fever, nausea and vomiting, constipation. He continues to smoke at times which is the only time he removes his O2.  His brother was present from [place] today during visit.  Advised that pt remains symptomatic but stable for now.  He states that the males in their family usually die before age 62 with CV disease but the relatives on his mother's side live to their 74s.  Pt had advised he is still driving some to go to the store or just get out of the house for short periods. Pt states he is just not ready to be on Hospice again or as out of it as previously when he was kept comfortable until he has to so he can keep his independence. Brother indicated that they had been out for lunch recently.  Advised of pt's concern for their mother and he states "He knows that we are going to take care of [place]. He is just not ready to give up driving yet."   History obtained from review of EMR, discussion with  family, and/or Mr. Hayashi.  I reviewed available labs, medications, imaging, studies and related documents from the EMR.  Records reviewed and summarized above.   ROS  General: NAD,  fatigue Cardiovascular: endorses continued "lung pain", denies chest pain, endorses DOE Pulmonary: endorses cough  productive of brown sputum unchanged since starting Doxycycline.  Continued SOB and wheezing, worsening orthopnea the past 2 weeks Abdomen: endorses poor appetite, denies constipation, endorses continence of bowel GU: denies dysuria, endorses continence of urine MSK:  endorses increased weakness and fatigue,  no falls reported Skin: erythema of distal right foot resolved Neurological: endorses pain, some insomnia Psych: Endorses depressed mood   Physical Exam: Current and past weights: 222 lbs 9.6 ounces as of 01/06/22, 228 lbs at [facility] PCP on 02/02/22 Constitutional: NAD General: frail appearing, obese  EYES: anicteric sclera CV: S1S2, RRR,  Trace Bilat pedal edema, Noted clubbing Pulmonary: Scattered inspiratory and expiratory wheeze throughout, diminished in bases on 6L [place] currently Abdomen: Less distended and softer than prior on left, active BS + 4  quadrants MSK: no sarcopenia, moves all extremities, ambulatory Skin: No erythema or increased heat of BLE Neuro:  noted generalized weakness,  no cognitive impairment Psych: Depressed affect, A and O x 3    Thank you for the opportunity to participate in the care of Mr. Quintela.  The palliative care team will continue to follow. Please call our office at [phone] if we can be of additional assistance.   Marijo Conception, FNP -C  COVID-19 PATIENT SCREENING TOOL Asked and negative response unless otherwise noted:   Have you had symptoms of covid, tested positive or been in contact with someone with symptoms/positive test in the past 5-10 days? Unknown

## 2022-02-27 ENCOUNTER — Other Ambulatory Visit: Payer: Self-pay | Admitting: Cardiology

## 2022-03-03 ENCOUNTER — Other Ambulatory Visit: Payer: Self-pay | Admitting: Cardiology

## 2022-03-16 ENCOUNTER — Telehealth: Payer: Self-pay | Admitting: Podiatry

## 2022-03-16 NOTE — Telephone Encounter (Signed)
Patient called he wants to know if you ever got his shoes in ?  [name] ordered them 6 months ago? Please advise

## 2022-03-27 ENCOUNTER — Ambulatory Visit: Payer: Medicare Other | Attending: Cardiology | Admitting: Cardiology

## 2022-03-27 ENCOUNTER — Encounter: Payer: Self-pay | Admitting: Cardiology

## 2022-03-27 VITALS — BP 110/72 | HR 98 | Ht 72.0 in | Wt 228.2 lb

## 2022-03-27 DIAGNOSIS — I25119 Atherosclerotic heart disease of native coronary artery with unspecified angina pectoris: Secondary | ICD-10-CM

## 2022-03-27 DIAGNOSIS — J449 Chronic obstructive pulmonary disease, unspecified: Secondary | ICD-10-CM | POA: Diagnosis not present

## 2022-03-27 DIAGNOSIS — I5032 Chronic diastolic (congestive) heart failure: Secondary | ICD-10-CM | POA: Diagnosis not present

## 2022-03-27 MED ORDER — ISOSORBIDE MONONITRATE ER 60 MG PO TB24
60.0000 mg | ORAL_TABLET | Freq: Every morning | ORAL | 3 refills | Status: DC
Start: 2022-03-27 — End: 2023-03-04

## 2022-03-27 NOTE — Patient Instructions (Addendum)

## 2022-03-27 NOTE — Progress Notes (Signed)
Cardiology Office Note  Date: 03/27/2022   ID: Joaovictor, Krone 11-30-1955, MRN [id number]  PCP:  Sheran Spine, FNP  Cardiologist:  [name], MD Electrophysiologist:  None   Chief Complaint  Patient presents with   Cardiac follow-up    History of Present Illness: [name] is a 66 y.o. male last seen in August 2022 by Mr. Leonides Sake NP.  He is here today with an assistant for a follow-up visit. He is still followed by palliative care.  Visit noted with Dr. Chase Caller in June, I reviewed the note.  Predominant issue at this time is COPD with chronic hypoxic respiratory failure, no evidence of ILD by last chest CT. He reports good and bad days in terms of overall generalized fatigue and shortness of breath.  I reviewed his medications, largely unchanged from a cardiac perspective with the exception of up titration of Imdur.  Blood pressure is well controlled today.  I reviewed his echocardiogram and chest CT results from last year.  He did have lab work earlier this year with creatinine 1.62 and hemoglobin 12.9.  Past Medical History:  Diagnosis Date   Anxiety    Arthritis    Cholecystitis, acute 12/20/2013   Lap chole 6/5   Cocaine abuse ([place])    Community acquired pneumonia 05/31/2018   COPD (chronic obstructive pulmonary disease) ([name])    Coronary atherosclerosis of native coronary artery    a. 03/09/2013 Cath/PCI: LM nl, LAD: 50p, 6m(2.5x16 promus DES), LCX nl, OM1 min irregs, LPL/LPDA diff dzs, RCA nondom, mod diff dzs, EF 55%.   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 ([place] 04/24/2021   History of nephrolithiasis    History of pneumonia    History of stroke    Right MCA distribution, residual left-sided weakness   Hyperlipidemia    Metabolic encephalopathy    Noncompliance    Osteomyelitis of second toe of left  foot ([place] 04/24/2021   Renal cell carcinoma ([facility]    Status post radical right nephrectomy August 2015   Respiratory failure with hypoxia ([facility] [place]    Sepsis ([facility] 05/19/2019   Sleep apnea    On CPAP, 4L O2 no cpap at home yet   Type 2 diabetes mellitus ([facility] [place] 2011    Past Surgical History:  Procedure Laterality Date   ABDOMINAL AORTOGRAM W/LOWER EXTREMITY Left 07/17/2021   Procedure: ABDOMINAL AORTOGRAM W/LOWER EXTREMITY;  Surgeon: BSerafina Mitchell MD;  [facility];  Service: Cardiovascular;  Laterality: Left;   AMPUTATION Left 12/27/2020   Procedure: PARTIAL FIRST RAY AMPUTATION OF FOOT;  Surgeon: PFelipa Furnace DPM;  [place] [place] [facility]  Service: Podiatry;  Laterality: Left;   APPENDECTOMY  1970's   CHOLECYSTECTOMY N/A 12/22/2013   Procedure: LAPAROSCOPIC CHOLECYSTECTOMY ;  Surgeon: DHarl Bowie MD;  [place] [facility]  Service: General;  Laterality: N/A;   ENDARTERECTOMY Right 10/08/2014   Procedure: Right ENDARTERECTOMY CAROTID;  Surgeon: TRosetta Posner MD;  [place] [facility]  Service: Vascular;  Laterality: Right;   LAPAROSCOPIC LYSIS OF ADHESIONS  02/21/2014   Procedure: LAPAROSCOPIC LYSIS OF ADHESIONS EXTINSIVE;  Surgeon: TAlexis Frock MD;  [facility];  Service: Urology;;   LEFT HEART CATHETERIZATION WITH CORONARY ANGIOGRAM N/A 06/02/2012   Procedure: LEFT HEART CATHETERIZATION WITH CORONARY ANGIOGRAM;  Surgeon: TMarcello Moores  Baird Kay, MD;  Location: [place] [facility];  Service: Cardiovascular;  Laterality: N/A;   LEFT HEART CATHETERIZATION WITH CORONARY ANGIOGRAM N/A 03/09/2013   Procedure: LEFT HEART CATHETERIZATION WITH CORONARY ANGIOGRAM;  Surgeon: [name], MD;  Location[facility] C[facility] LAB;  Service: Cardiovascular;  Laterality: N/A;   LEFT HEART CATHETERIZATION WITH CORONARY ANGIOGRAM N/A 12/20/2013   Procedure: LEFT HEART CATHETERIZATION WITH CORONARY ANGIOGRAM;  Surgeon: [name], MD;  Location: [facility] CATH LAB;  Service: Cardiovascular;  Laterality: N/A;    LUNG BIOPSY Left 05/18/2014   Procedure: LUNG BIOPSY left upper lobe & left lower lobe;  Surgeon: [name], MD;  Location: [place];  Service: Thoracic;  Laterality: Left;   PATCH ANGIOPLASTY Right 10/08/2014   Procedure: PATCH ANGIOPLASTY Right Carotid;  Surgeon: [name], MD;  Location: [place];  Service: Vascular;  Laterality: Right;   PERIPHERAL VASCULAR INTERVENTION Left 07/17/2021   Procedure: PERIPHERAL VASCULAR INTERVENTION;  Surgeon: Serafina Mitchell, MD;  Location: [place] [facility];  Service: Cardiovascular;  Laterality: Left;  Superficial Femoral Artery   ROBOT ASSISTED LAPAROSCOPIC NEPHRECTOMY Right 02/21/2014   Procedure: ROBOTIC ASSISTED LAPAROSCOPIC RIGHT NEPHRECTOMY ;  Surgeon: [name], MD;  [facility];  Service: Urology;  Laterality: Right;   VIDEO ASSISTED THORACOSCOPY Left 05/18/2014   Procedure: LEFT VIDEO ASSISTED THORACOSCOPY;  Surgeon: [name], MD;  Location: [place];  Service: Thoracic;  Laterality: Left;   VIDEO BRONCHOSCOPY  05/18/2014   Procedure: VIDEO BRONCHOSCOPY;  Surgeon: [name], MD;  Location: [place];  Service: Thoracic;;    Current Outpatient Medications  Medication Sig Dispense Refill   acetaminophen (TYLENOL) 325 MG tablet Take 650 mg by mouth See admin instructions. Take 650 mg every 3-4 hours as needed for pain.     ALPRAZolam (XANAX) 1 MG tablet Take 1 mg by mouth 3 (three) times daily as needed.     atorvastatin (LIPITOR) 20 MG tablet Take 20 mg by mouth at bedtime.  1   Budeson-Glycopyrrol-Formoterol (BREZTRI AEROSPHERE) 160-9-4.8 MCG/ACT AERO Inhale 2 puffs into the lungs in the morning and at bedtime. 10.7 g 5   budesonide (PULMICORT) 1 MG/2ML nebulizer solution Take 1 mg by nebulization in the morning, at noon, and at bedtime.     clopidogrel (PLAVIX) 75 MG tablet Take 1 tablet (75 mg total) by mouth daily. 90 tablet 1   diltiazem (CARDIZEM CD) 180 MG 24 hr capsule TAKE ONE CAPSULE BY MOUTH EVERY DAY 90  capsule 2   diphenhydrAMINE (BENADRYL) 25 mg capsule Take 50 mg by mouth at bedtime.     esomeprazole (NEXIUM) 20 MG capsule Take 20 mg by mouth daily at 12 noon.     FLUoxetine (PROZAC) 20 MG capsule Take 20 mg by mouth daily.     FLUoxetine (PROZAC) 40 MG capsule Take 40 mg by mouth daily.     formoterol (PERFOROMIST) 20 MCG/2ML nebulizer solution Take 20 mcg by nebulization as needed (shortness of breath).     gabapentin (NEURONTIN) 300 MG capsule Take 900 mg by mouth 3 (three) times daily.     glucose blood (TRUE METRIX BLOOD GLUCOSE TEST) test strip      insulin regular human CONCENTRATED (HUMULIN R) 500 UNIT/ML injection Inject 90 Units into the skin 3 (three) times daily with meals.     linagliptin (TRADJENTA) 5 MG TABS tablet Take 5 mg by mouth daily.     nitroGLYCERIN (NITROSTAT) 0.4 MG SL tablet Place 1 tablet (0.4 mg  total) under the tongue every 5 (five) minutes x 3 doses as needed for chest pain (if no relief after 2nd dose, proceed to ED or call 911). 25 tablet 3   oxyCODONE-acetaminophen (PERCOCET) 10-325 MG tablet Take 1 tablet by mouth daily as needed for pain.     potassium chloride SA (K-DUR,KLOR-CON) 20 MEQ tablet Take 20 mEq by mouth 2 (two) times daily.      pregabalin (LYRICA) 25 MG capsule Take 25 mg by mouth 2 (two) times daily.     Probiotic Product (PROBIOTIC PO) Take 1 capsule by mouth daily.     tamsulosin (FLOMAX) 0.4 MG CAPS capsule Take 0.4 mg by mouth daily.      torsemide (DEMADEX) 20 MG tablet TAKE THREE TABLETS BY MOUTH TWICE DAILY (Patient taking differently: Take 60 mg by mouth 2 (two) times daily.) 540 tablet 0   zolpidem (AMBIEN) 5 MG tablet Take 5 mg by mouth at bedtime. For insomnia.     isosorbide mononitrate (IMDUR) 60 MG 24 hr tablet Take 1 tablet (60 mg total) by mouth in the morning. & 30 mg in the evening 135 tablet 3   No current facility-administered medications for this visit.   Allergies:  Lisinopril, Pollen extract, and Oxycodone   ROS: No  palpitations.  Physical Exam: VS:  BP 110/72 (BP Location: Left Arm, Patient Position: Sitting, Cuff Size: Large)   Pulse 98   Ht 6' (1.829 m)   Wt 228 lb 3.2 oz (103.5 kg)   SpO2 93% Comment: currently on 6 liters of oxygen  BMI 30.95 kg/m , BMI Body mass index is 30.95 kg/m.  Wt Readings from Last 3 Encounters:  03/27/22 228 lb 3.2 oz (103.5 kg)  01/06/22 222 lb 9.6 oz (101 kg)  12/23/21 222 lb 9.6 oz (101 kg)    General: Chronically ill-appearing, no distress, wearing oxygen via nasal cannula. Neck: Supple, no elevated JVP or carotid bruits. Lungs: Coarse breath sounds throughout with prolonged expiratory phase and wheeze. Cardiac: Distant regular heart sounds, no S3 or significant systolic murmur. Extremities: Mild leg edema.  ECG:  An ECG dated 10/23/2021 was personally reviewed today and demonstrated:  Sinus rhythm with PAC, poor R wave progression, nonspecific T wave changes and leftward axis.  Recent Labwork: 10/24/2021: ALT 10; AST 11; BUN 25; Creatinine, Ser 1.62; Hemoglobin 12.9; Platelets 186; Potassium 4.1; Sodium 138   Other Studies Reviewed Today:  High-resolution chest CT 09/25/2020: IMPRESSION: 1. No evidence of interstitial lung disease. Air trapping is indicative of small airways disease. 2. Pleuroparenchymal scarring and volume loss in the lower left hemithorax. 3. Aortic atherosclerosis (ICD10-I70.0). Coronary artery calcification. 4. Enlarged pulmonic trunk, indicative of pulmonary arterial hypertension. 5.  Emphysema (ICD10-J43.9).  Echocardiogram 12/06/2020:  1. Left ventricular ejection fraction, by estimation, is 55 to 60%. The  left ventricle has normal function. The left ventricle has no regional  wall motion abnormalities. There is mild left ventricular hypertrophy.  Left ventricular diastolic parameters  are consistent with Grade I diastolic dysfunction (impaired relaxation).   2. Right ventricular systolic function is normal. The right  ventricular  size is normal. Tricuspid regurgitation signal is inadequate for assessing  PA pressure.   3. Left atrial size was mildly dilated.   4. The mitral valve is grossly normal. Trivial mitral valve  regurgitation.   5. The aortic valve is tricuspid. Aortic valve regurgitation is not  visualized.   6. Mildly dilated pulmonary artery.   7. The inferior vena cava is dilated  in size with >50% respiratory  variability, suggesting right atrial pressure of 8 mmHg.   Assessment and Plan:  1.  CAD with prior LAD intervention and patent stent site in 2015 with otherwise moderate circumflex and RCA disease managed medically.  LVEF 55 to 60% by echocardiogram last year.  Plan to continue observation on Plavix, Lipitor, Cardizem CD, and Imdur which was uptitrated in the interim.  Not on beta-blocker given severe lung disease.  2.  Severe emphysema with chronic hypoxic respiratory failure.  He continues to follow with Dr. Chase Caller and is on continuous oxygen.  Also being followed by palliative care.  3.  HFpEF with fluctuating fluid status.  He remains on Demadex with potassium supplement.  4.  CKD stage IIIb.  Medication Adjustments/Labs and Tests Ordered: Current medicines are reviewed at length with the patient today.  Concerns regarding medicines are outlined above.   Tests Ordered: No orders of the defined types were placed in this encounter.   Medication Changes: Meds ordered this encounter  Medications   isosorbide mononitrate (IMDUR) 60 MG 24 hr tablet    Sig: Take 1 tablet (60 mg total) by mouth in the morning. & 30 mg in the evening    Dispense:  135 tablet    Refill:  3    Disposition:  Follow up  6 months.  Signed, Satira Sark, MD, [facility] 03/27/2022 2:24 PM    [facility] [place], [place],  [address] Phone: [phone]; Fax: [phone]

## 2022-04-01 NOTE — Telephone Encounter (Signed)
Left verbal message with a gentleman in the home to have pt call back regarding his shoes order. Aaron Edelman did not enter the patient into [employer] so the order has not been processed. I will submit the paperwork today.

## 2022-04-01 NOTE — Telephone Encounter (Signed)
Spoke with pt and informed him that the order was never submitted. Apologized and patient was understanding of the situation. Will submit CMN to his treating physician and follow up with him when he comes in for his appt with Posey Pronto next week.

## 2022-04-08 ENCOUNTER — Ambulatory Visit: Payer: Medicare Other | Admitting: Podiatry

## 2022-04-09 ENCOUNTER — Telehealth: Payer: Self-pay | Admitting: Family Medicine

## 2022-04-09 NOTE — Telephone Encounter (Signed)
Received voice mail yesterday from pt's friend, Jodelle Gross, to call patient that he was requesting a visit. TCT pt's cell and home phone with no answer on cell phone.  Mother states pt has gone to see his doctor in [place]. Damaris Hippo FNP-C

## 2022-04-10 ENCOUNTER — Telehealth: Payer: Self-pay

## 2022-04-10 NOTE — Telephone Encounter (Signed)
[name] with patient. Patient reports "having good days and bad. Sometimes I can't even get up." Reports not having an aide any more. Needs help.   Said he needed to see NP for a visit. Scheduled with Santiago Glad, NP for 04/15/22 at 1145a.

## 2022-04-10 NOTE — Telephone Encounter (Signed)
1137- Palliative Check In  Attempted to contact patient to schedule f/u with NP. No answer. LVM

## 2022-04-15 ENCOUNTER — Other Ambulatory Visit: Payer: Medicare Other | Admitting: Family Medicine

## 2022-04-15 VITALS — BP 88/68 | HR 104 | Wt 223.4 lb

## 2022-04-15 DIAGNOSIS — J9611 Chronic respiratory failure with hypoxia: Secondary | ICD-10-CM

## 2022-04-15 DIAGNOSIS — I5032 Chronic diastolic (congestive) heart failure: Secondary | ICD-10-CM | POA: Diagnosis not present

## 2022-04-15 DIAGNOSIS — J441 Chronic obstructive pulmonary disease with (acute) exacerbation: Secondary | ICD-10-CM | POA: Diagnosis not present

## 2022-04-15 NOTE — Progress Notes (Signed)
Therapist, nutritional Palliative Care Consult Note Telephone: [phone]  Fax: [phone]    Date of encounter: 04/15/22 12:01 PM PATIENT NAME: [name] [address] [place] [address]   [phone] (home)  DOB: September 11, 1955 MRN: [id number] PRIMARY CARE PROVIDER:   Kae Heller, NP [facility]  REFERRING PROVIDER:   Genia Hotter, FNP [address] [place],  [place] [address] [phone]  RESPONSIBLE PARTY:    Contact Information     Name Relation Home Work Mobile   [name],Kevin Mother [phone]  [phone]   [name] [phone]  [phone]   [name]   [phone]        I met face to face with patient in his home. Palliative Care was asked to follow this patient by consultation request of Kae Heller, NP to address advance care planning and complex medical decision making. This is a follow up visit.                                   ASSESSMENT, SYMPTOM MANAGEMENT AND PLAN / RECOMMENDATIONS:   Chronic respiratory failure due to COPD/ILD Continues on 6 L [place] with increase to 8L with sleep or exertion.   Chronic diastolic heart failure Decrease in frequency/severity of CP-Continue Imdur 60 mg in am, 30 mg Q pm. Continue Plavix 75 mg, Cardizem CDS 180 mg daily    Advance Care Planning/Goals of Care: Goals include to maximize quality of life and symptom management.  Our advance care planning conversation included a discussion about:    Exploration of personal, cultural or spiritual beliefs that might influence medical decisions-He states previously when on Hospice service he was kept "so comfortable I didn't know I was in the world." He says he is ready but cannot get on Hospice until his mother is gone. Decision not to resuscitate or to de-escalate disease focused treatments due to poor prognosis. CODE STATUS: MOST as of 10/30/2015: DNR/DNI with limited additional  intervention Use of antibiotics  and IV fluids if indicated Feeding tube for a defined trial period     Follow up Palliative Care Visit: Palliative care will continue to follow for complex medical decision making, advance care planning, and clarification of goals. Return 4 weeks or prn.   This visit was coded based on medical decision making (MDM).  PPS: 40%  HOSPICE ELIGIBILITY/DIAGNOSIS: TBD  Chief Complaint: Palliative Care is continuing to follow pt with cardiomyopathy and chronic respiratory failure.  HISTORY OF PRESENT ILLNESS:  [name] is a 66 y.o. year old male with chronic respiratory failure  due to COPD, ILD and pulmonary fibrosis. He has poorly controlled type 2 IDDM, HTN, CAD, carotid artery stenosis, chronic diastolic heart failure, OSA, severe PVD with peripheral neuropathy, a hx of right renal carcinoma s/p nephrectomy and diabetic foot ulcer with amputation of distal portion of left foot, CKD and BPH.  He also has hx of polysubstance abuse with tobacco and cocaine.   He states his blood sugar today was 277 which is lower for him but continues to run some 400s. Blood sugars running 370s-430s. He continues taking 90 units U-500 TID, 35 units short acting insulin before meals TID and rotating his sites. He states he has periods of severe fatigue and has not been able to get OOB and not been able to eat. He has no sensation in bilat feet,  lost his shoe and didn't know that he was without a shoe.  Denies diaphoresis and nausea.  He had some CP yesterday but states he has not really had a lot of CP since adding a 30 mg Imdur added in the evenings daily (takes 60 mg Imdur in am). He has O2 at 6-8 L [place].  Fell OOB during the night about 2 weeks ago.  He has worsening SOB at rest and DOE.     History obtained from review of EMR, discussion with [name].  I reviewed available labs, medications, imaging, studies and related documents from the EMR.  Records reviewed and  summarized above.   ROS  General: NAD, fatigue Cardiovascular: endorses continued "lung pain", denies chest pain, endorses DOE Pulmonary: endorses continued cough productive of brown sputum unchanged even after last antibiotic.  Continued wheezing but is at baseline.  Has worsening orthopnea and PND Abdomen: endorses poor appetite, denies constipation, endorses continence of bowel GU: denies dysuria, endorses continence of urine MSK:  endorses increased weakness and fatigue,  no falls reported Skin: Some duskiness of bilat feet and has short, sharp pain in bilateral feet Neurological: endorses pain 5/10 scale in lungs, some insomnia Psych: Endorses depressed mood   Physical Exam: Current and past weights: 222 lbs 9.6 ounces as of 01/06/22, 228 lbs  3.2 ounces on 03/27/22, current weight on home scale today 223.4 lbs Constitutional: NAD General: frail appearing, obese  EYES: anicteric sclera CV: S1S2, IRIR, no BLE edema, Noted clubbing and duskiness of bilat feet/hands, Cap refill > 3 sec Pulmonary: Scattered inspiratory and expiratory wheeze throughout with course bronchovesicular sounds on 8L [place] currently Abdomen: Less distended and softer than prior on left, active BS + 4 quadrants MSK: no sarcopenia, moves all extremities, ambulatory Skin: No erythema or increased heat of BLE Neuro:  noted generalized weakness,  no cognitive impairment Psych: Depressed affect, A and O x 3    Thank you for the opportunity to participate in the care of [name].  The palliative care team will continue to follow. Please call our office at [phone] if we can be of additional assistance.   Kevin Conception, FNP -C  COVID-19 PATIENT SCREENING TOOL Asked and negative response unless otherwise noted:   Have you had symptoms of covid, tested positive or been in contact with someone with symptoms/positive test in the past 5-10 days? No

## 2022-04-18 ENCOUNTER — Encounter: Payer: Self-pay | Admitting: Family Medicine

## 2022-04-24 ENCOUNTER — Encounter: Payer: Self-pay | Admitting: *Deleted

## 2022-04-24 ENCOUNTER — Telehealth: Payer: Self-pay | Admitting: *Deleted

## 2022-04-24 NOTE — Patient Outreach (Signed)
  Care Coordination   Initial Visit Note   04/24/2022 Name: [name] MRN: [id number] DOB: 1956-05-05  [name] is a 66 y.o. year old male who sees [name], Girtha Rm, FNP for primary care. I spoke with  [name] by phone today.  What matters to the patients health and wellness today?  No needs    Goals Addressed               This Visit's Progress     COMPLETED: No needs (pt-stated)        Care Coordination Interventions: Reviewed medications with patient and discussed Adherence with all medications and offered education accordingly Reviewed scheduled/upcoming provider appointments including pending appointments and verified pt has completed his AWV last week with his primary provider. Screening for signs and symptoms of depression related to chronic disease state  Assessed social determinant of health barriers         SDOH assessments and interventions completed:  Yes  SDOH Interventions Today    Flowsheet Row Most Recent Value  SDOH Interventions   Food Insecurity Interventions Intervention Not Indicated  Housing Interventions Intervention Not Indicated  Utilities Interventions Intervention Not Indicated        Care Coordination Interventions Activated:  Yes  Care Coordination Interventions:  Yes, provided   Follow up plan: No further intervention required.   Encounter Outcome:  Pt. Visit Completed   Raina Mina, RN Care Management Coordinator [place] Office [phone]

## 2022-04-24 NOTE — Patient Instructions (Signed)
Visit Information  Thank you for taking time to visit with me today. Please don't hesitate to contact me if I can be of assistance to you.   Following are the goals we discussed today:   Goals Addressed               This Visit's Progress     COMPLETED: No needs (pt-stated)        Care Coordination Interventions: Reviewed medications with patient and discussed Adherence with all medications and offered education accordingly Reviewed scheduled/upcoming provider appointments including pending appointments and verified pt has completed his AWV last week with his primary provider. Screening for signs and symptoms of depression related to chronic disease state  Assessed social determinant of health barriers         Please call the care guide team at [phone] if you need to cancel or reschedule your appointment.   If you are experiencing a Mental Health or [place] or need someone to talk to, please call the Suicide and Crisis Lifeline: 988  The patient verbalized understanding of instructions, educational materials, and care plan provided today and DECLINED offer to receive copy of patient instructions, educational materials, and care plan.   No further follow up required: No needs  Raina Mina, RN Care Management Coordinator [place] Office [phone]

## 2022-05-04 DIAGNOSIS — G894 Chronic pain syndrome: Secondary | ICD-10-CM | POA: Diagnosis not present

## 2022-05-04 DIAGNOSIS — J9611 Chronic respiratory failure with hypoxia: Secondary | ICD-10-CM | POA: Diagnosis not present

## 2022-05-04 DIAGNOSIS — L821 Other seborrheic keratosis: Secondary | ICD-10-CM | POA: Diagnosis not present

## 2022-05-04 DIAGNOSIS — N183 Chronic kidney disease, stage 3 unspecified: Secondary | ICD-10-CM | POA: Diagnosis not present

## 2022-05-04 DIAGNOSIS — I5032 Chronic diastolic (congestive) heart failure: Secondary | ICD-10-CM | POA: Diagnosis not present

## 2022-05-04 DIAGNOSIS — Z89412 Acquired absence of left great toe: Secondary | ICD-10-CM | POA: Diagnosis not present

## 2022-05-04 DIAGNOSIS — E119 Type 2 diabetes mellitus without complications: Secondary | ICD-10-CM | POA: Diagnosis not present

## 2022-05-04 DIAGNOSIS — Z23 Encounter for immunization: Secondary | ICD-10-CM | POA: Diagnosis not present

## 2022-05-06 ENCOUNTER — Ambulatory Visit: Payer: Medicare Other | Admitting: Podiatry

## 2022-05-08 DIAGNOSIS — Z794 Long term (current) use of insulin: Secondary | ICD-10-CM | POA: Diagnosis not present

## 2022-05-08 DIAGNOSIS — E1140 Type 2 diabetes mellitus with diabetic neuropathy, unspecified: Secondary | ICD-10-CM | POA: Diagnosis not present

## 2022-05-11 ENCOUNTER — Other Ambulatory Visit: Payer: Self-pay

## 2022-05-11 ENCOUNTER — Emergency Department (HOSPITAL_COMMUNITY): Payer: Medicare Other

## 2022-05-11 ENCOUNTER — Encounter (HOSPITAL_COMMUNITY): Payer: Self-pay | Admitting: *Deleted

## 2022-05-11 ENCOUNTER — Emergency Department (HOSPITAL_COMMUNITY)
Admission: EM | Admit: 2022-05-11 | Discharge: 2022-05-11 | Disposition: A | Discharge status: Home / Self Care | Payer: Medicare Other | Attending: Emergency Medicine | Admitting: Emergency Medicine

## 2022-05-11 DIAGNOSIS — E119 Type 2 diabetes mellitus without complications: Secondary | ICD-10-CM | POA: Diagnosis not present

## 2022-05-11 DIAGNOSIS — R6889 Other general symptoms and signs: Secondary | ICD-10-CM | POA: Diagnosis not present

## 2022-05-11 DIAGNOSIS — Z7984 Long term (current) use of oral hypoglycemic drugs: Secondary | ICD-10-CM | POA: Diagnosis not present

## 2022-05-11 DIAGNOSIS — Z20822 Contact with and (suspected) exposure to covid-19: Secondary | ICD-10-CM | POA: Diagnosis not present

## 2022-05-11 DIAGNOSIS — R0602 Shortness of breath: Secondary | ICD-10-CM | POA: Diagnosis not present

## 2022-05-11 DIAGNOSIS — D72829 Elevated white blood cell count, unspecified: Secondary | ICD-10-CM | POA: Diagnosis not present

## 2022-05-11 DIAGNOSIS — I10 Essential (primary) hypertension: Secondary | ICD-10-CM | POA: Diagnosis not present

## 2022-05-11 DIAGNOSIS — J168 Pneumonia due to other specified infectious organisms: Secondary | ICD-10-CM | POA: Diagnosis not present

## 2022-05-11 DIAGNOSIS — J441 Chronic obstructive pulmonary disease with (acute) exacerbation: Secondary | ICD-10-CM | POA: Insufficient documentation

## 2022-05-11 DIAGNOSIS — Z794 Long term (current) use of insulin: Secondary | ICD-10-CM | POA: Diagnosis not present

## 2022-05-11 DIAGNOSIS — I499 Cardiac arrhythmia, unspecified: Secondary | ICD-10-CM | POA: Diagnosis not present

## 2022-05-11 DIAGNOSIS — R739 Hyperglycemia, unspecified: Secondary | ICD-10-CM | POA: Diagnosis not present

## 2022-05-11 DIAGNOSIS — R059 Cough, unspecified: Secondary | ICD-10-CM | POA: Diagnosis not present

## 2022-05-11 DIAGNOSIS — R918 Other nonspecific abnormal finding of lung field: Secondary | ICD-10-CM | POA: Diagnosis not present

## 2022-05-11 DIAGNOSIS — Z79899 Other long term (current) drug therapy: Secondary | ICD-10-CM | POA: Diagnosis not present

## 2022-05-11 DIAGNOSIS — Z7902 Long term (current) use of antithrombotics/antiplatelets: Secondary | ICD-10-CM | POA: Diagnosis not present

## 2022-05-11 DIAGNOSIS — F17200 Nicotine dependence, unspecified, uncomplicated: Secondary | ICD-10-CM | POA: Diagnosis not present

## 2022-05-11 DIAGNOSIS — J189 Pneumonia, unspecified organism: Secondary | ICD-10-CM

## 2022-05-11 DIAGNOSIS — Z7951 Long term (current) use of inhaled steroids: Secondary | ICD-10-CM | POA: Insufficient documentation

## 2022-05-11 DIAGNOSIS — R0689 Other abnormalities of breathing: Secondary | ICD-10-CM | POA: Diagnosis not present

## 2022-05-11 DIAGNOSIS — Z743 Need for continuous supervision: Secondary | ICD-10-CM | POA: Diagnosis not present

## 2022-05-11 LAB — TROPONIN I (HIGH SENSITIVITY)
Troponin I (High Sensitivity): 9 ng/L (ref <18)
Troponin I (High Sensitivity): 8 ng/L (ref <18)

## 2022-05-11 LAB — BASIC METABOLIC PANEL
Anion gap: 12 (ref 5–15)
BUN: 21 mg/dL (ref 8–23)
CO2: 29 mmol/L (ref 22–32)
Calcium: 8.9 mg/dL (ref 8.9–10.3)
Chloride: 97 mmol/L — ABNORMAL LOW (ref 98–111)
Creatinine, Ser: 1.73 mg/dL — ABNORMAL HIGH (ref 0.61–1.24)
GFR, Estimated: 43 mL/min — ABNORMAL LOW (ref >60)
Glucose, Bld: 238 mg/dL — ABNORMAL HIGH (ref 70–99)
Potassium: 4 mmol/L (ref 3.5–5.1)
Sodium: 138 mmol/L (ref 135–145)

## 2022-05-11 LAB — CBC WITH DIFFERENTIAL/PLATELET
Abs Immature Granulocytes: 0.09 10*3/uL — ABNORMAL HIGH (ref 0.00–0.07)
Basophils Absolute: 0 10*3/uL (ref 0.0–0.1)
Basophils Relative: 0 %
Eosinophils Absolute: 0.1 10*3/uL (ref 0.0–0.5)
Eosinophils Relative: 1 %
HCT: 45.6 % (ref 39.0–52.0)
Hemoglobin: 15.1 g/dL (ref 13.0–17.0)
Immature Granulocytes: 1 %
Lymphocytes Relative: 12 %
Lymphs Abs: 1.3 10*3/uL (ref 0.7–4.0)
MCH: 27.6 pg (ref 26.0–34.0)
MCHC: 33.1 g/dL (ref 30.0–36.0)
MCV: 83.4 fL (ref 80.0–100.0)
Monocytes Absolute: 0.7 10*3/uL (ref 0.1–1.0)
Monocytes Relative: 6 %
Neutro Abs: 9 10*3/uL — ABNORMAL HIGH (ref 1.7–7.7)
Neutrophils Relative %: 80 %
Platelets: 221 10*3/uL (ref 150–400)
RBC: 5.47 MIL/uL (ref 4.22–5.81)
RDW: 13.8 % (ref 11.5–15.5)
WBC: 11.2 10*3/uL — ABNORMAL HIGH (ref 4.0–10.5)
nRBC: 0 % (ref 0.0–0.2)

## 2022-05-11 LAB — RESP PANEL BY RT-PCR (FLU A&B, COVID) ARPGX2
Influenza A by PCR: NEGATIVE
Influenza B by PCR: NEGATIVE
SARS Coronavirus 2 by RT PCR: NEGATIVE

## 2022-05-11 MED ORDER — GUAIFENESIN 100 MG/5ML PO LIQD
15.0000 mL | Freq: Once | ORAL | Status: AC
  Administered 2022-05-11: 15 mL via ORAL
  Filled 2022-05-11: qty 15

## 2022-05-11 MED ORDER — IPRATROPIUM BROMIDE 0.02 % IN SOLN
0.5000 mg | Freq: Once | RESPIRATORY_TRACT | Status: AC
Start: 2022-05-11 — End: 2022-05-11
  Administered 2022-05-11: 0.5 mg via RESPIRATORY_TRACT
  Filled 2022-05-11: qty 2.5

## 2022-05-11 MED ORDER — DOXYCYCLINE HYCLATE 100 MG PO TABS
100.0000 mg | ORAL_TABLET | Freq: Once | ORAL | Status: AC
  Administered 2022-05-11: 100 mg via ORAL
  Filled 2022-05-11: qty 1

## 2022-05-11 MED ORDER — PREDNISONE 20 MG PO TABS: 20.0000 mg | ORAL_TABLET | Freq: Every day | ORAL | 0 refills | Status: AC

## 2022-05-11 MED ORDER — METHYLPREDNISOLONE SODIUM SUCC 125 MG IJ SOLR
80.0000 mg | Freq: Once | INTRAMUSCULAR | Status: AC
Start: 2022-05-11 — End: 2022-05-11
  Administered 2022-05-11: 80 mg via INTRAVENOUS
  Filled 2022-05-11: qty 2

## 2022-05-11 MED ORDER — DOXYCYCLINE HYCLATE 100 MG PO CAPS: 100.0000 mg | ORAL_CAPSULE | Freq: Two times a day (BID) | ORAL | 0 refills | Status: DC

## 2022-05-11 NOTE — ED Notes (Signed)
Pt waiting on ride

## 2022-05-11 NOTE — ED Notes (Signed)
Pt states ride should be here any minute. Pt resting. NAD

## 2022-05-11 NOTE — ED Triage Notes (Signed)
Pt c/o sob that started x one week ago after getting his flu shot; pt is on 6L of O2 at all times    Co2 with ems was 12, pt placed on NRB and CO2 increased to 30  EMS reports pleural rubbing sound to lungs  Cbg 271  Pt denies any pain, just sob

## 2022-05-11 NOTE — ED Notes (Signed)
Pt in bed, pt states that he is on 6L via [place] at home, pt presently on 6L via , pt satting low to mid 90s, pt has wet, unproductive cough,

## 2022-05-11 NOTE — ED Provider Notes (Signed)
[facility] Provider Note   CSN: [id number] Arrival date & time: 05/11/22  1102     History  Chief Complaint  Patient presents with   Shortness of Breath    [name] is a 66 y.o. male.  Pt is a 66 yo male with pmh of COPD, hypertension, hyperlipidemia, diabetes, OSA, current smoker presenting for sob. Pt admits to cough with progressive sob x 1 week.  Patient presents to emergency department on his home 6 L nasal cannula.  No hypoxia on arrival.  Denies fevers or chills. Denies sinus pressure, nasal congestion, or throat pain. Denies nausea, vomiting, diarrhea, or abdominal pain.   The history is provided by the patient. No language interpreter was used.  Shortness of Breath Associated symptoms: cough   Associated symptoms: no abdominal pain, no chest pain, no ear pain, no fever, no rash, no sore throat and no vomiting        Home Medications Prior to Admission medications   Medication Sig Start Date End Date Taking? Authorizing Provider  doxycycline (VIBRAMYCIN) 100 MG capsule Take 1 capsule (100 mg total) by mouth 2 (two) times daily. 85/02/77  Yes Campbell Stall P, DO  predniSONE (DELTASONE) 20 MG tablet Take 1 tablet (20 mg total) by mouth daily for 5 days. 05/11/22 41/28/78 Yes Campbell Stall P, DO  acetaminophen (TYLENOL) 325 MG tablet Take 650 mg by mouth See admin instructions. Take 650 mg every 3-4 hours as needed for pain. 06/03/18   [provider]  ALPRAZolam Duanne Moron) 1 MG tablet Take 1 mg by mouth 3 (three) times daily as needed. 01/07/21   [provider]  atorvastatin (LIPITOR) 20 MG tablet Take 20 mg by mouth at bedtime. 04/26/18   [provider]  Budeson-Glycopyrrol-Formoterol (BREZTRI AEROSPHERE) 160-9-4.8 MCG/ACT AERO Inhale 2 puffs into the lungs in the morning and at bedtime. 02/26/21   Brand Males, MD  budesonide (PULMICORT) 1 MG/2ML nebulizer solution Take 1 mg by nebulization in the morning, at noon, and  at bedtime. 01/16/20   [provider]  clopidogrel (PLAVIX) 75 MG tablet Take 1 tablet (75 mg total) by mouth daily. 01/21/22   Satira Sark, MD  diltiazem (CARDIZEM CD) 180 MG 24 hr capsule TAKE ONE CAPSULE BY MOUTH EVERY DAY 03/03/22   Satira Sark, MD  diphenhydrAMINE (BENADRYL) 25 mg capsule Take 50 mg by mouth at bedtime.    [provider]  esomeprazole (NEXIUM) 20 MG capsule Take 20 mg by mouth daily at 12 noon.    [provider]  FLUoxetine (PROZAC) 20 MG capsule Take 20 mg by mouth daily. 01/03/21   [provider]  FLUoxetine (PROZAC) 40 MG capsule Take 40 mg by mouth daily.    [provider]  formoterol (PERFOROMIST) 20 MCG/2ML nebulizer solution Take 20 mcg by nebulization as needed (shortness of breath). 01/16/20   [provider]  gabapentin (NEURONTIN) 300 MG capsule Take 900 mg by mouth 3 (three) times daily.    [provider]  glucose blood (TRUE METRIX BLOOD GLUCOSE TEST) test strip     [provider]  insulin regular human CONCENTRATED (HUMULIN R) 500 UNIT/ML injection Inject 90 Units into the skin 3 (three) times daily with meals.    [provider]  isosorbide mononitrate (IMDUR) 60 MG 24 hr tablet Take 1 tablet (60 mg total) by mouth in the morning. & 30 mg in the evening 03/27/22   Satira Sark, MD  linagliptin (  TRADJENTA) 5 MG TABS tablet Take 5 mg by mouth daily.    [provider]  nitroGLYCERIN (NITROSTAT) 0.4 MG SL tablet Place 1 tablet (0.4 mg total) under the tongue every 5 (five) minutes x 3 doses as needed for chest pain (if no relief after 2nd dose, proceed to ED or call 911). 02/27/22   Satira Sark, MD  oxyCODONE-acetaminophen (PERCOCET) 10-325 MG tablet Take 1 tablet by mouth daily as needed for pain. 09/25/21   [provider]  potassium chloride SA (K-DUR,KLOR-CON) 20 MEQ tablet Take 20 mEq by mouth 2 (two) times daily.     [provider]   pregabalin (LYRICA) 25 MG capsule Take 25 mg by mouth 2 (two) times daily. 02/14/19   [provider]  Probiotic Product (PROBIOTIC PO) Take 1 capsule by mouth daily.    [provider]  tamsulosin (FLOMAX) 0.4 MG CAPS capsule Take 0.4 mg by mouth daily.     [provider]  torsemide (DEMADEX) 20 MG tablet TAKE THREE TABLETS BY MOUTH TWICE DAILY Patient taking differently: Take 60 mg by mouth 2 (two) times daily. 06/26/21   Satira Sark, MD  zolpidem (AMBIEN) 5 MG tablet Take 5 mg by mouth at bedtime. For insomnia. 03/03/19   [provider]      Allergies    Lisinopril, Pollen extract, and Oxycodone    Review of Systems   Review of Systems  Constitutional:  Negative for chills and fever.  HENT:  Negative for ear pain and sore throat.   Eyes:  Negative for pain and visual disturbance.  Respiratory:  Positive for cough and shortness of breath.   Cardiovascular:  Negative for chest pain and palpitations.  Gastrointestinal:  Negative for abdominal pain and vomiting.  Genitourinary:  Negative for dysuria and hematuria.  Musculoskeletal:  Negative for arthralgias and back pain.  Skin:  Negative for color change and rash.  Neurological:  Negative for seizures and syncope.  All other systems reviewed and are negative.   Physical Exam Updated Vital Signs BP (!) 118/53   Pulse 91   Temp 97.6 F (36.4 C) (Oral)   Resp 19   Ht '5\' 9"'$  (1.753 m)   Wt 102.1 kg   SpO2 94%   BMI 33.23 kg/m  Physical Exam Vitals and nursing note reviewed.  Constitutional:      General: He is not in acute distress.    Appearance: He is well-developed.  HENT:     Head: Normocephalic and atraumatic.  Eyes:     Conjunctiva/sclera: Conjunctivae normal.  Cardiovascular:     Rate and Rhythm: Normal rate and regular rhythm.     Heart sounds: No murmur heard. Pulmonary:     Effort: Pulmonary effort is normal. No respiratory distress.     Breath sounds: Examination of  the right-lower field reveals rales. Examination of the left-lower field reveals rales. Wheezing and rales present.  Abdominal:     Palpations: Abdomen is soft.     Tenderness: There is no abdominal tenderness.  Musculoskeletal:        General: No swelling.     Cervical back: Neck supple.  Skin:    General: Skin is warm and dry.     Capillary Refill: Capillary refill takes less than 2 seconds.  Neurological:     Mental Status: He is alert.  Psychiatric:        Mood and Affect: Mood normal.     ED Results / Procedures /  Treatments   Labs (all labs ordered are listed, but only abnormal results are displayed) Labs Reviewed  CBC WITH DIFFERENTIAL/PLATELET - Abnormal; Notable for the following components:      Result Value   WBC 11.2 (*)    Neutro Abs 9.0 (*)    Abs Immature Granulocytes 0.09 (*)    All other components within normal limits  BASIC METABOLIC PANEL - Abnormal; Notable for the following components:   Chloride 97 (*)    Glucose, Bld 238 (*)    Creatinine, Ser 1.73 (*)    GFR, Estimated 43 (*)    All other components within normal limits  RESP PANEL BY RT-PCR (FLU A&B, COVID) [id number]  TROPONIN I (HIGH SENSITIVITY)  TROPONIN I (HIGH SENSITIVITY)    EKG EKG Interpretation  Date/Time:  Monday May 11 2022 11:13:17 EDT Ventricular Rate:  95 PR Interval:  201 QRS Duration: 97 QT Interval:  360 QTC Calculation: 453 R Axis:   236 Text Interpretation: Sinus rhythm Atrial premature complex Markedly posterior QRS axis Low voltage, extremity leads Confirmed by Campbell Stall ([id number]) on 75/91/6384 2:46:29 PM  Radiology DG Chest 2 View  Result Date: 05/11/2022 CLINICAL DATA:  Shortness of breath and cough repeat film with nipple markers. EXAM: CHEST - 2 VIEW COMPARISON:  Earlier same day FINDINGS: Collapse/consolidative opacity with pleural fluid/thickening in the left base again noted. Nodular density reported in the lower left lung on the previous study does not  represent the patient's nipple based on nipple marker location today. Heart size stable. Telemetry leads overlie the chest. IMPRESSION: 1. Left base collapse/consolidative opacity with pleural fluid/thickening. 2. Nodular density projecting at the left base on the previous study does not represent the patient's nipple. CT chest recommended to further evaluate. Electronically Signed   By: Misty Stanley M.D.   On: 05/11/2022 14:11   DG Chest Portable 1 View  Result Date: 05/11/2022 CLINICAL DATA:  Provided history: Shortness of breath. Additional history provided: Shortness of breath which began 1 week ago following flu shot. History of COPD and asthma. EXAM: PORTABLE CHEST 1 VIEW COMPARISON:  Prior chest radiographs 10/23/2021 and earlier. Chest CT 09/24/2020. FINDINGS: Mild cardiomegaly. Aortic valve sclerosis. An 11 mm nodular opacity projects in the region of the left lung base. Redemonstrated foci of scarring within left mid and lower lung field. Suspected superimposed airspace disease within the left mid lung. No appreciable airspace consolidation on the right. No evidence of pleural effusion or pneumothorax. No acute bony abnormality identified. Spondylosis and levocurvature of the thoracic spine. IMPRESSION: 11 mm nodular opacity projecting in the region of the left lung base, new from prior exams. This may reflect an asymmetric nipple shadow or a pulmonary nodule. A repeat AP chest radiograph with nipple markers is recommended. If this finding does not correspond with the left-sided nipple marker on the follow-up radiographs, a chest CT is recommended for further evaluation. Suspected airspace disease within the left mid lung. Background scarring within the left mid and lower lung fields. Electronically Signed   By: Kellie Simmering D.O.   On: 05/11/2022 12:00    Procedures Procedures    Medications Ordered in ED Medications  doxycycline (VIBRA-TABS) tablet 100 mg (has no administration in time  range)  ipratropium (ATROVENT) nebulizer solution 0.5 mg (0.5 mg Nebulization Given 05/11/22 1242)  methylPREDNISolone sodium succinate (SOLU-MEDROL) 125 mg/2 mL injection 80 mg (80 mg Intravenous Given 05/11/22 1329)  guaiFENesin (ROBITUSSIN) 100 MG/5ML liquid 15 mL (15 mLs Oral Given 05/11/22 1331)  ED Course/ Medical Decision Making/ A&P                           Medical Decision Making Amount and/or Complexity of Data Reviewed Labs: ordered. Radiology: ordered.  Risk OTC drugs. Prescription drug management.   2:48 PM 66 yo male with pmh of COPD, hypertension, hyperlipidemia, diabetes, OSA, current smoker presenting for sob and cough x1 week.  Patient is alert and oriented x3, no acute distress, afebrile, so vital signs.  Physical exam demonstrates wheezes bilaterally and rales in the lower lung fields.  Patient states he has an allergy to albuterol.  Ipratropium ordered with Solu-Medrol for likely COPD exacerbation possibly from URI or pneumonia.  ECG as interpreted by myself demonstrates this rhythm.  No ST segment elevation or depression.  Stable electrolytes.  Stable troponins.  Stable laboratory studies.  Mild leukocytosis present at 11.2.  Chest x-ray demonstrates focal opacity at the left lung base with pleural fluid and thickening concerning for pneumonia.  Otherwise no signs or symptoms of sepsis.  No increased oxygen requirements.  Antibiotics and prednisone sent to pharmacy for likely COPD exacerbation secondary to pneumonia.    Patient in no distress and overall condition improved here in the ED. Detailed discussions were had with the patient regarding current findings, and need for close f/u with PCP or on call doctor. The patient has been instructed to return immediately if the symptoms worsen in any way for re-evaluation. Patient verbalized understanding and is in agreement with current care plan. All questions answered prior to discharge.       Final Clinical  Impression(s) / ED Diagnoses Final diagnoses:  Pneumonia of left lower lobe due to infectious organism  COPD exacerbation ([place])    Rx / DC Orders ED Discharge Orders          Ordered    doxycycline (VIBRAMYCIN) 100 MG capsule  2 times daily        05/11/22 1447    predniSONE (DELTASONE) 20 MG tablet  Daily        05/11/22 1447              Lianne Cure, DO 09/81/19 1448

## 2022-05-24 ENCOUNTER — Other Ambulatory Visit: Payer: Self-pay

## 2022-05-24 ENCOUNTER — Encounter (HOSPITAL_COMMUNITY): Payer: Self-pay

## 2022-05-24 ENCOUNTER — Emergency Department (HOSPITAL_COMMUNITY): Payer: Medicare Other

## 2022-05-24 ENCOUNTER — Emergency Department (HOSPITAL_COMMUNITY)
Admission: EM | Admit: 2022-05-24 | Discharge: 2022-05-25 | Disposition: A | Discharge status: Home / Self Care | Payer: Medicare Other | Attending: Emergency Medicine | Admitting: Emergency Medicine

## 2022-05-24 DIAGNOSIS — R404 Transient alteration of awareness: Secondary | ICD-10-CM | POA: Diagnosis not present

## 2022-05-24 DIAGNOSIS — J439 Emphysema, unspecified: Secondary | ICD-10-CM | POA: Diagnosis not present

## 2022-05-24 DIAGNOSIS — J9811 Atelectasis: Secondary | ICD-10-CM | POA: Diagnosis not present

## 2022-05-24 DIAGNOSIS — Z7902 Long term (current) use of antithrombotics/antiplatelets: Secondary | ICD-10-CM | POA: Diagnosis not present

## 2022-05-24 DIAGNOSIS — S0240FA Zygomatic fracture, left side, initial encounter for closed fracture: Secondary | ICD-10-CM | POA: Diagnosis not present

## 2022-05-24 DIAGNOSIS — R531 Weakness: Secondary | ICD-10-CM | POA: Diagnosis not present

## 2022-05-24 DIAGNOSIS — R55 Syncope and collapse: Secondary | ICD-10-CM | POA: Insufficient documentation

## 2022-05-24 DIAGNOSIS — R911 Solitary pulmonary nodule: Secondary | ICD-10-CM | POA: Diagnosis not present

## 2022-05-24 DIAGNOSIS — S0291XA Unspecified fracture of skull, initial encounter for closed fracture: Secondary | ICD-10-CM | POA: Diagnosis not present

## 2022-05-24 DIAGNOSIS — J449 Chronic obstructive pulmonary disease, unspecified: Secondary | ICD-10-CM | POA: Diagnosis not present

## 2022-05-24 DIAGNOSIS — I10 Essential (primary) hypertension: Secondary | ICD-10-CM | POA: Diagnosis not present

## 2022-05-24 DIAGNOSIS — R42 Dizziness and giddiness: Secondary | ICD-10-CM | POA: Insufficient documentation

## 2022-05-24 DIAGNOSIS — G9389 Other specified disorders of brain: Secondary | ICD-10-CM | POA: Diagnosis not present

## 2022-05-24 DIAGNOSIS — Z794 Long term (current) use of insulin: Secondary | ICD-10-CM | POA: Insufficient documentation

## 2022-05-24 DIAGNOSIS — R6889 Other general symptoms and signs: Secondary | ICD-10-CM | POA: Diagnosis not present

## 2022-05-24 DIAGNOSIS — J90 Pleural effusion, not elsewhere classified: Secondary | ICD-10-CM | POA: Diagnosis not present

## 2022-05-24 DIAGNOSIS — Z743 Need for continuous supervision: Secondary | ICD-10-CM | POA: Diagnosis not present

## 2022-05-24 DIAGNOSIS — R0689 Other abnormalities of breathing: Secondary | ICD-10-CM | POA: Diagnosis not present

## 2022-05-24 DIAGNOSIS — S0240DA Maxillary fracture, left side, initial encounter for closed fracture: Secondary | ICD-10-CM | POA: Diagnosis not present

## 2022-05-24 LAB — COMPREHENSIVE METABOLIC PANEL
ALT: 18 U/L (ref 0–44)
AST: 16 U/L (ref 15–41)
Albumin: 2.9 g/dL — ABNORMAL LOW (ref 3.5–5.0)
Alkaline Phosphatase: 70 U/L (ref 38–126)
Anion gap: 7 (ref 5–15)
BUN: 21 mg/dL (ref 8–23)
CO2: 22 mmol/L (ref 22–32)
Calcium: 8.8 mg/dL — ABNORMAL LOW (ref 8.9–10.3)
Chloride: 108 mmol/L (ref 98–111)
Creatinine, Ser: 1.28 mg/dL — ABNORMAL HIGH (ref 0.61–1.24)
GFR, Estimated: >60 mL/min (ref >60)
Glucose, Bld: 180 mg/dL — ABNORMAL HIGH (ref 70–99)
Potassium: 3.9 mmol/L (ref 3.5–5.1)
Sodium: 137 mmol/L (ref 135–145)
Total Bilirubin: 0.8 mg/dL (ref 0.3–1.2)
Total Protein: 6.6 g/dL (ref 6.5–8.1)

## 2022-05-24 LAB — CBC WITH DIFFERENTIAL/PLATELET
Abs Immature Granulocytes: 0.04 10*3/uL (ref 0.00–0.07)
Basophils Absolute: 0 10*3/uL (ref 0.0–0.1)
Basophils Relative: 0 %
Eosinophils Absolute: 0.1 10*3/uL (ref 0.0–0.5)
Eosinophils Relative: 1 %
HCT: 43.8 % (ref 39.0–52.0)
Hemoglobin: 14.3 g/dL (ref 13.0–17.0)
Immature Granulocytes: 1 %
Lymphocytes Relative: 19 %
Lymphs Abs: 1.5 10*3/uL (ref 0.7–4.0)
MCH: 26.9 pg (ref 26.0–34.0)
MCHC: 32.6 g/dL (ref 30.0–36.0)
MCV: 82.5 fL (ref 80.0–100.0)
Monocytes Absolute: 0.5 10*3/uL (ref 0.1–1.0)
Monocytes Relative: 7 %
Neutro Abs: 5.6 10*3/uL (ref 1.7–7.7)
Neutrophils Relative %: 72 %
Platelets: 221 10*3/uL (ref 150–400)
RBC: 5.31 MIL/uL (ref 4.22–5.81)
RDW: 13.8 % (ref 11.5–15.5)
WBC: 7.8 10*3/uL (ref 4.0–10.5)
nRBC: 0 % (ref 0.0–0.2)

## 2022-05-24 LAB — TROPONIN I (HIGH SENSITIVITY)
Troponin I (High Sensitivity): 9 ng/L (ref <18)
Troponin I (High Sensitivity): 9 ng/L (ref <18)

## 2022-05-24 MED ORDER — IOHEXOL 300 MG/ML  SOLN
75.0000 mL | Freq: Once | INTRAMUSCULAR | Status: AC | PRN
  Administered 2022-05-24: 75 mL via INTRAVENOUS

## 2022-05-24 MED ORDER — SODIUM CHLORIDE 0.9 % IV BOLUS
500.0000 mL | Freq: Once | INTRAVENOUS | Status: AC
  Administered 2022-05-24: 500 mL via INTRAVENOUS

## 2022-05-24 NOTE — Discharge Instructions (Signed)
Follow-up with your family doctor this week and follow-up with your lung doctor in the next couple weeks

## 2022-05-24 NOTE — ED Provider Notes (Signed)
[facility] EMERGENCY DEPARTMENT Provider Note   CSN: [id number] Arrival date & time: 05/24/22  1654     History {Add pertinent medical, surgical, social history, OB history to HPI:1} Chief Complaint  Patient presents with   Loss of Consciousness    [name] is a 66 y.o. male.  Patient had a syncopal episode today.  Patient has a history of COPD and hypertension.   Loss of Consciousness      Home Medications Prior to Admission medications   Medication Sig Start Date End Date Taking? Authorizing Provider  acetaminophen (TYLENOL) 325 MG tablet Take 650 mg by mouth See admin instructions. Take 650 mg every 3-4 hours as needed for pain. 06/03/18   [provider]  ALPRAZolam Duanne Moron) 1 MG tablet Take 1 mg by mouth 3 (three) times daily as needed. 01/07/21   [provider]  atorvastatin (LIPITOR) 20 MG tablet Take 20 mg by mouth at bedtime. 04/26/18   [provider]  Budeson-Glycopyrrol-Formoterol (BREZTRI AEROSPHERE) 160-9-4.8 MCG/ACT AERO Inhale 2 puffs into the lungs in the morning and at bedtime. 02/26/21   Brand Males, MD  budesonide (PULMICORT) 1 MG/2ML nebulizer solution Take 1 mg by nebulization in the morning, at noon, and at bedtime. 01/16/20   [provider]  clopidogrel (PLAVIX) 75 MG tablet Take 1 tablet (75 mg total) by mouth daily. 01/21/22   Satira Sark, MD  diltiazem (CARDIZEM CD) 180 MG 24 hr capsule TAKE ONE CAPSULE BY MOUTH EVERY DAY 03/03/22   Satira Sark, MD  diphenhydrAMINE (BENADRYL) 25 mg capsule Take 50 mg by mouth at bedtime.    [provider]  doxycycline (VIBRAMYCIN) 100 MG capsule Take 1 capsule (100 mg total) by mouth 2 (two) times daily. 78/67/54   Campbell Stall P, DO  esomeprazole (NEXIUM) 20 MG capsule Take 20 mg by mouth daily at 12 noon.    [provider]  FLUoxetine (PROZAC) 20 MG capsule Take 20 mg by mouth daily. 01/03/21   [provider]  FLUoxetine (PROZAC)  40 MG capsule Take 40 mg by mouth daily.    [provider]  formoterol (PERFOROMIST) 20 MCG/2ML nebulizer solution Take 20 mcg by nebulization as needed (shortness of breath). 01/16/20   [provider]  gabapentin (NEURONTIN) 300 MG capsule Take 900 mg by mouth 3 (three) times daily.    [provider]  glucose blood (TRUE METRIX BLOOD GLUCOSE TEST) test strip     [provider]  insulin regular human CONCENTRATED (HUMULIN R) 500 UNIT/ML injection Inject 90 Units into the skin 3 (three) times daily with meals.    [provider]  isosorbide mononitrate (IMDUR) 60 MG 24 hr tablet Take 1 tablet (60 mg total) by mouth in the morning. & 30 mg in the evening 03/27/22   Satira Sark, MD  linagliptin (TRADJENTA) 5 MG TABS tablet Take 5 mg by mouth daily.    [provider]  nitroGLYCERIN (NITROSTAT) 0.4 MG SL tablet Place 1 tablet (0.4 mg total) under the tongue every 5 (five) minutes x 3 doses as needed for chest pain (if no relief after 2nd dose, proceed to ED or call 911). 02/27/22   Satira Sark, MD  oxyCODONE-acetaminophen (PERCOCET) 10-325 MG tablet Take 1 tablet by mouth daily as needed for pain. 09/25/21   [provider]  potassium chloride SA (K-DUR,KLOR-CON) 20 MEQ tablet Take 20 mEq by mouth 2 (two) times daily.     [provider]  pregabalin (LYRICA) 25 MG capsule Take 25 mg by mouth 2 (two) times daily. 02/14/19   [provider]  Probiotic Product (PROBIOTIC PO) Take 1 capsule by mouth daily.    [provider]  tamsulosin (FLOMAX) 0.4 MG CAPS capsule Take 0.4 mg by mouth daily.     [provider]  torsemide (DEMADEX) 20 MG tablet TAKE THREE TABLETS BY MOUTH TWICE DAILY Patient taking differently: Take 60 mg by mouth 2 (two) times daily. 06/26/21   Satira Sark, MD  zolpidem (AMBIEN) 5 MG tablet Take 5 mg by mouth at bedtime. For insomnia. 03/03/19   [provider]       Allergies    Lisinopril, Pollen extract, and Oxycodone    Review of Systems   Review of Systems  Cardiovascular:  Positive for syncope.    Physical Exam Updated Vital Signs BP 135/71   Pulse 81   Temp 97.9 F (36.6 C) (Oral)   Resp (!) 22   Ht '5\' 9"'$  (1.753 m)   Wt 102 kg   SpO2 99%   BMI 33.21 kg/m  Physical Exam  ED Results / Procedures / Treatments   Labs (all labs ordered are listed, but only abnormal results are displayed) Labs Reviewed  COMPREHENSIVE METABOLIC PANEL - Abnormal; Notable for the following components:      Result Value   Glucose, Bld 180 (*)    Creatinine, Ser 1.28 (*)    Calcium 8.8 (*)    Albumin 2.9 (*)    All other components within normal limits  CBC WITH DIFFERENTIAL/PLATELET  TROPONIN I (HIGH SENSITIVITY)  TROPONIN I (HIGH SENSITIVITY)    EKG None  Radiology CT Chest W Contrast  Result Date: 05/24/2022 CLINICAL DATA:  Abnormal xray - lung nodule, >= 1 cm EXAM: CT CHEST WITH CONTRAST TECHNIQUE: Multidetector CT imaging of the chest was performed during intravenous contrast administration. RADIATION DOSE REDUCTION: This exam was performed according to the departmental dose-optimization program which includes automated exposure control, adjustment of the mA and/or kV according to patient size and/or use of iterative reconstruction technique. CONTRAST:  21m OMNIPAQUE IOHEXOL 300 MG/ML  SOLN COMPARISON:  High-resolution chest CT 09/24/2020, radiograph earlier today. FINDINGS: Cardiovascular: Mild cardiomegaly. There are coronary artery calcifications. Moderate atherosclerosis of the thoracic aorta without acute aortic findings. Atherosclerosis of the aortic branch vessels. Dilatation of the main pulmonary artery at 3.7 cm. There is no pulmonary embolus to the segmental level. Mediastinum/Nodes: No enlarged mediastinal or hilar lymph nodes. Scattered small mediastinal lymph nodes are not enlarged by size criteria. Largest lymph node in the  pre-vascular space at 10 mm short axis is stable from 2022. Small hiatal hernia. Subcentimeter peripherally calcified right thyroid nodule. No follow-up is recommended. Lungs/Pleura: Moderate emphysema. Central bronchial thickening. Chronic left lung volume loss with pleuroparenchymal scarring in the left upper and lower lobe. Areas of chronic parenchymal calcification in the area of scarring. Areas of chronic round atelectasis in the left lower lobe. There is no evidence of acute airspace disease. Left lower lobe nodule measures 5 mm, series 4, image 98. This previously measured 3 mm. No pleural fluid. Upper Abdomen: Moderate atherosclerosis of the upper abdominal aorta with irregular plaque. No aneurysm or acute findings where included. No acute upper abdominal findings. Musculoskeletal: Thoracic spondylosis. There are no acute or suspicious osseous abnormalities. Remote left lateral eighth rib fracture. IMPRESSION: 1. Left lower lobe pulmonary nodule measures 5 mm. This previously measured 3 mm in March of 2022.  Given this is increased in size, recommend follow-up CT in 6 months. 2. Chronic left lung volume loss with pleuroparenchymal scarring in the left upper and lower lobe. Areas of chronic round atelectasis in the left lower lobe. 3. No acute airspace disease. 4. Dilatation of the main pulmonary artery consistent with pulmonary arterial hypertension. 5. Aortic atherosclerosis.  Coronary artery calcifications. Aortic Atherosclerosis (ICD10-I70.0) and Emphysema (ICD10-J43.9). Electronically Signed   By: Keith Rake M.D.   On: 05/24/2022 23:22   CT Head Wo Contrast  Result Date: 05/24/2022 CLINICAL DATA:  Status post fall. EXAM: CT HEAD WITHOUT CONTRAST TECHNIQUE: Contiguous axial images were obtained from the base of the skull through the vertex without intravenous contrast. RADIATION DOSE REDUCTION: This exam was performed according to the departmental dose-optimization program which includes  automated exposure control, adjustment of the mA and/or kV according to patient size and/or use of iterative reconstruction technique. COMPARISON:  October 23, 2021 FINDINGS: Brain: There is mild cerebral atrophy with widening of the extra-axial spaces and ventricular dilatation. There are areas of decreased attenuation within the white matter tracts of the supratentorial brain, consistent with microvascular disease changes. A stable chronic right frontoparietal infarct is seen. Vascular: No hyperdense vessel or unexpected calcification. Skull: A chronic fracture deformity of the left zygomatic arch is seen. Sinuses/Orbits: There is marked severity right maxillary sinus, sphenoid sinus and right ethmoid sinus mucosal thickening. Mild to moderate severity left maxillary sinus and left ethmoid sinus mucosal thickening is also seen. Other: None. IMPRESSION: 1. Generalized cerebral atrophy and chronic white matter small vessel ischemic changes without evidence of an acute intracranial abnormality. 2. Chronic right frontoparietal infarct. 3. Marked severity pansinus disease. Electronically Signed   By: Virgina Norfolk M.D.   On: 05/24/2022 19:23   DG Chest Port 1 View  Result Date: 05/24/2022 CLINICAL DATA:  Recent diagnosis of pneumonia presenting with dizziness and generalized weakness. EXAM: PORTABLE CHEST 1 VIEW COMPARISON:  May 11, 2022 FINDINGS: The cardiac silhouette is mildly enlarged and unchanged in size. There is marked severity calcification of the aortic arch. Mild, diffuse, chronic appearing increased interstitial lung markings are noted. Moderate severity left basilar airspace disease is seen which is mildly decreased in severity when compared to the prior study. Associated pleural thickening is seen with a small, stable left pleural effusion. No pneumothorax is identified. Multilevel degenerative changes seen throughout the thoracic spine. IMPRESSION: 1. Moderate severity left basilar airspace  disease which is mildly decreased in severity when compared to the prior study. 2. Stable left basilar pleural thickening with a small, stable left pleural effusion. Electronically Signed   By: Virgina Norfolk M.D.   On: 05/24/2022 19:18    Procedures Procedures  {Document cardiac monitor, telemetry assessment procedure when appropriate:1}  Medications Ordered in ED Medications  sodium chloride 0.9 % bolus 500 mL (0 mLs Intravenous Stopped 05/24/22 1913)  iohexol (OMNIPAQUE) 300 MG/ML solution 75 mL (75 mLs Intravenous Contrast Given 05/24/22 2254)    ED Course/ Medical Decision Making/ A&P                           Medical Decision Making Amount and/or Complexity of Data Reviewed Labs: ordered. Radiology: ordered. ECG/medicine tests: ordered.  Risk Prescription drug management.   Patient with syncope.  Labs unremarkable.  Patient has chronic lung problems.  He is referred back to his family doctor and pulmonologist  {Document critical care time when appropriate:1} {Document review of labs and  clinical decision tools ie heart score, Chads2Vasc2 etc:1}  {Document your independent review of radiology images, and any outside records:1} {Document your discussion with family members, caretakers, and with consultants:1} {Document social determinants of health affecting pt's care:1} {Document your decision making why or why not admission, treatments were needed:1} Final Clinical Impression(s) / ED Diagnoses Final diagnoses:  Syncope and collapse    Rx / DC Orders ED Discharge Orders     None

## 2022-05-24 NOTE — ED Notes (Signed)
Patient transported to CT 

## 2022-05-24 NOTE — ED Triage Notes (Signed)
Pt presents to ED via [facility] after LOC. Pt was sitting at home, had LOC, fell out of chair and hit his head. Pt c/o dizziness and generalized weakness. Diagnosed with pneumonia 2 weeks ago. CBG 247.

## 2022-05-27 ENCOUNTER — Other Ambulatory Visit: Payer: Self-pay | Admitting: Cardiology

## 2022-05-28 ENCOUNTER — Ambulatory Visit (INDEPENDENT_AMBULATORY_CARE_PROVIDER_SITE_OTHER): Payer: Medicare Other | Admitting: Podiatry

## 2022-05-28 DIAGNOSIS — E1142 Type 2 diabetes mellitus with diabetic polyneuropathy: Secondary | ICD-10-CM

## 2022-05-28 DIAGNOSIS — Z89432 Acquired absence of left foot: Secondary | ICD-10-CM | POA: Diagnosis not present

## 2022-05-28 NOTE — Progress Notes (Signed)
Subjective:  Patient ID: [name], male    DOB: [birth year],  MRN: [id number]  Chief Complaint  Patient presents with   Nail Problem    Nail trim     66 y.o. male presents with the above complaint.  Patient presents with left transmetatarsal amputation which has reepithelialized.  At this time patient will benefit from diabetic shoes with a failure to the left side.  He states he is doing okay.  Weightbearing as tolerated in regular shoes.  Denies any other acute complaints   Review of Systems: Negative except as noted in the HPI. Denies N/V/F/Ch.  Past Medical History:  Diagnosis Date   Anxiety    Arthritis    Cholecystitis, acute 12/20/2013   Lap chole 6/5   Cocaine abuse ([place])    Community acquired pneumonia 05/31/2018   COPD (chronic obstructive pulmonary disease) ([name])    Coronary atherosclerosis of native coronary artery    a. 03/09/2013 Cath/PCI: LM nl, LAD: 50p, 44m(2.5x16 promus DES), LCX nl, OM1 min irregs, LPL/LPDA diff dzs, RCA nondom, mod diff dzs, EF 55%.   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 ([facility] 04/24/2021   History of nephrolithiasis    History of pneumonia    History of stroke    Right MCA distribution, residual left-sided weakness   Hyperlipidemia    Metabolic encephalopathy    Noncompliance    Osteomyelitis of second toe of left foot ([facility] 04/24/2021   [place]al cell carcinoma ([facility]    Status post radical right nephrectomy August 2015   Res[place]    Sepsis ([facility] 05/19/2019   Sleep apnea    On CPAP, 4L O2 no cpap at home yet   Type 2 diabetes mellitus ([facility] 2011    Current Outpatient Medications:    acetaminophen (TYLENOL) 325 MG tablet, Take 650 mg by mouth See admin instructions. Take 650 mg every 3-4 hours as needed for pain., Disp: , Rfl:    ALPRAZolam (XANAX) 1 MG tablet,  Take 1 mg by mouth 3 (three) times daily as needed., Disp: , Rfl:    atorvastatin (LIPITOR) 20 MG tablet, Take 20 mg by mouth at bedtime., Disp: , Rfl: 1   Budeson-Glycopyrrol-Formoterol (BREZTRI AEROSPHERE) 160-9-4.8 MCG/ACT AERO, Inhale 2 puffs into the lungs in the morning and at bedtime., Disp: 10.7 g, Rfl: 5   budesonide (PULMICORT) 1 MG/2ML nebulizer solution, Take 1 mg by nebulization in the morning, at noon, and at bedtime., Disp: , Rfl:    clopidogrel (PLAVIX) 75 MG tablet, TAKE ONE TABLET BY MOUTH EVERY DAY, Disp: 90 tablet, Rfl: 1   diltiazem (CARDIZEM CD) 180 MG 24 hr capsule, TAKE ONE CAPSULE BY MOUTH EVERY DAY, Disp: 90 capsule, Rfl: 2   diphenhydrAMINE (BENADRYL) 25 mg capsule, Take 50 mg by mouth at bedtime., Disp: , Rfl:    doxycycline (VIBRAMYCIN) 100 MG capsule, Take 1 capsule (100 mg total) by mouth 2 (two) times daily., Disp: 20 capsule, Rfl: 0   esomeprazole (NEXIUM) 20 MG capsule, Take 20 mg by mouth daily at 12 noon., Disp: , Rfl:    FLUoxetine (PROZAC) 20 MG capsule, Take 20 mg by mouth daily., Disp: , Rfl:    FLUoxetine (PROZAC) 40 MG capsule, Take 40 mg by mouth daily., Disp: , Rfl:    formoterol (PERFOROMIST) 20  MCG/2ML nebulizer solution, Take 20 mcg by nebulization as needed (shortness of breath)., Disp: , Rfl:    gabapentin (NEURONTIN) 300 MG capsule, Take 900 mg by mouth 3 (three) times daily., Disp: , Rfl:    glucose blood (TRUE METRIX BLOOD GLUCOSE TEST) test strip, , Disp: , Rfl:    insulin regular human CONCENTRATED (HUMULIN R) 500 UNIT/ML injection, Inject 90 Units into the skin 3 (three) times daily with meals., Disp: , Rfl:    isosorbide mononitrate (IMDUR) 60 MG 24 hr tablet, Take 1 tablet (60 mg total) by mouth in the morning. & 30 mg in the evening, Disp: 135 tablet, Rfl: 3   linagliptin (TRADJENTA) 5 MG TABS tablet, Take 5 mg by mouth daily., Disp: , Rfl:    nitroGLYCERIN (NITROSTAT) 0.4 MG SL tablet, Place 1 tablet (0.4 mg total) under the tongue every 5  (five) minutes x 3 doses as needed for chest pain (if no relief after 2nd dose, proceed to ED or call 911)., Disp: 25 tablet, Rfl: 3   oxyCODONE-acetaminophen (PERCOCET) 10-325 MG tablet, Take 1 tablet by mouth daily as needed for pain., Disp: , Rfl:    potassium chloride SA (K-DUR,KLOR-CON) 20 MEQ tablet, Take 20 mEq by mouth 2 (two) times daily. , Disp: , Rfl:    pregabalin (LYRICA) 25 MG capsule, Take 25 mg by mouth 2 (two) times daily., Disp: , Rfl:    Probiotic Product (PROBIOTIC PO), Take 1 capsule by mouth daily., Disp: , Rfl:    tamsulosin (FLOMAX) 0.4 MG CAPS capsule, Take 0.4 mg by mouth daily. , Disp: , Rfl:    torsemide (DEMADEX) 20 MG tablet, TAKE THREE TABLETS BY MOUTH TWICE DAILY (Patient taking differently: Take 60 mg by mouth 2 (two) times daily.), Disp: 540 tablet, Rfl: 0   zolpidem (AMBIEN) 5 MG tablet, Take 5 mg by mouth at bedtime. For insomnia., Disp: , Rfl:   Social History   Tobacco Use  Smoking Status Every Day   Packs/day: 1.00   Years: 41.00   Total pack years: 41.00   Types: Cigarettes   Start date: 05/03/1972   Passive exposure: Never  Smokeless Tobacco Never  Tobacco Comments   1/2 ppd 06/05/21    Allergies  Allergen Reactions   Lisinopril Other (See Comments)    Other reaction(s): worsening kidney function   Pollen Extract     Other reaction(s): sneezing   Oxycodone Itching and Nausea Only    Pt is able to tolerate if taken with benadryl   Objective:  There were no vitals filed for this visit. There is no height or weight on file to calculate BMI. Constitutional Well developed. Well nourished.  Vascular Dorsalis pedis pulses palpable bilaterally. Posterior tibial pulses palpable bilaterally. Capillary refill normal to all digits.  No cyanosis or clubbing noted. Pedal hair growth normal.  Neurologic Normal speech. Oriented to person, place, and time. Epicritic sensation to light touch grossly present bilaterally.  Dermatologic Xerosis noted  to both skin mild  Orthopedic: Left transmet tarsal amputation noted completely reepithelialized.  No signs of open wounds or lesion noted.   Radiographs: None Assessment:   1. History of transmetatarsal amputation of left foot ([place])    Plan:  Patient was evaluated and treated and all questions answered.  Left history of transmetatarsal amputation with underlying diabetes -All questions and concerns were discussed with the patient in extensive detail given the presence of transmetatarsal amputation I believe patient would benefit from diabetic shoes with a toe filler. -  Patient was casted for diabetic shoes today  No follow-ups on file.

## 2022-05-29 DIAGNOSIS — R55 Syncope and collapse: Secondary | ICD-10-CM | POA: Diagnosis not present

## 2022-06-17 DIAGNOSIS — E1140 Type 2 diabetes mellitus with diabetic neuropathy, unspecified: Secondary | ICD-10-CM | POA: Diagnosis not present

## 2022-06-26 ENCOUNTER — Other Ambulatory Visit: Payer: Self-pay | Admitting: Cardiology

## 2022-06-26 ENCOUNTER — Ambulatory Visit (INDEPENDENT_AMBULATORY_CARE_PROVIDER_SITE_OTHER): Payer: Medicare Other | Admitting: Nurse Practitioner

## 2022-06-26 ENCOUNTER — Ambulatory Visit (INDEPENDENT_AMBULATORY_CARE_PROVIDER_SITE_OTHER): Payer: Medicare Other

## 2022-06-26 ENCOUNTER — Encounter: Payer: Self-pay | Admitting: Nurse Practitioner

## 2022-06-26 VITALS — BP 122/64 | HR 101 | Wt 215.0 lb

## 2022-06-26 DIAGNOSIS — J441 Chronic obstructive pulmonary disease with (acute) exacerbation: Secondary | ICD-10-CM

## 2022-06-26 DIAGNOSIS — R058 Other specified cough: Secondary | ICD-10-CM | POA: Diagnosis not present

## 2022-06-26 DIAGNOSIS — I951 Orthostatic hypotension: Secondary | ICD-10-CM

## 2022-06-26 DIAGNOSIS — J449 Chronic obstructive pulmonary disease, unspecified: Secondary | ICD-10-CM

## 2022-06-26 DIAGNOSIS — R42 Dizziness and giddiness: Secondary | ICD-10-CM | POA: Diagnosis not present

## 2022-06-26 DIAGNOSIS — J9611 Chronic respiratory failure with hypoxia: Secondary | ICD-10-CM

## 2022-06-26 DIAGNOSIS — R059 Cough, unspecified: Secondary | ICD-10-CM | POA: Diagnosis not present

## 2022-06-26 MED ORDER — PREDNISONE 10 MG PO TABS: ORAL_TABLET | ORAL | 0 refills | Status: DC

## 2022-06-26 MED ORDER — AMOXICILLIN-POT CLAVULANATE 875-125 MG PO TABS: 1.0000 | ORAL_TABLET | Freq: Two times a day (BID) | ORAL | 0 refills | Status: DC

## 2022-06-26 NOTE — Progress Notes (Signed)
$'@Patient'D$  ID: [name], male    DOB: 1956/01/31, 66 y.o.   MRN: [id number]  Chief Complaint  Patient presents with   Consult    Pt is a hospital f/u for syncope of LOC    Referring provider: [name], Girtha Rm, FNP  HPI: 66 year old male, active smoker followed for COPD and chronic respiratory failure on supplemental O2.  He is a patient Dr. Golden Pop and last seen in office 12/23/2021.  He was previously diagnosed with interstitial lung disease.  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  This was reinterpreted in February 2020 at [facility] smoking-related interstitial fibrosis associated with emphysema.  He had repeat CT in March 2022 without ILD. Past medical history significant for hypertension, CAD, carotid artery disease, diastolic CHF, GERD, DM 2, CKD stage III, history of substance abuse, anxiety, HLD.  TEST/EVENTS:   12/23/2021: OV with Dr. Chase Caller for follow-up.  In April 2023 he was hospitalized for encephalopathy.  Urine tox was positive for benzos and opioids which she takes on a prescription basis.  Has trouble with wheezing all the time.  Continues on 6 L supplemental O2.  Frustrated by quality of life.  Currently COPD flare.  Could also be that he just has baseline wheezing.  Plan to treat with steroid inhaler.  Strongly encouraged him to stop smoking.  He was continued on [place]. Overall felt like his shortness of breath was multifactorial including emphysema, weight, physical deconditioning. Latest CT without evidence of ILD.  06/26/2022: Today - follow up Patient presents today for follow-up.  He was seen in the ED on 10/23 and treated for left lower lobe pneumonia with doxycycline course.  He was discharged home.  He then went back to the ED on 11/5 after a syncopal episode.  CT scan showed resolved infiltrates and no evidence of PE.  EKG with sinus rhythm without any significant change.  Troponins  were negative.  CT of the head was unremarkable aside from chronic severe pansinusitis.  Felt as though his syncope was related to low oxygen levels and dehydration. Today, he tells me that he is still struggling with dizziness and for the last few weeks he has had an increased, productive cough. Thick, brown sputum. He also feels a little more short of breath and has noticed himself wheezing more. He denies any further syncopal events. He has not had any fevers, chills, night sweats, hemoptysis. He is eating and drinking well. He is using Breztri twice daily. No increased oxygen needs - maintain on 5-6 lpm. His POC doesn't seem to be working right. He's tried to contact his DME company to have someone look at it but he can't seem to get anyone to help him.   Allergies  Allergen Reactions   Lisinopril Other (See Comments)    Other reaction(s): worsening kidney function   Pollen Extract     Other reaction(s): sneezing   Oxycodone Itching and Nausea Only    Pt is able to tolerate if taken with benadryl    Immunization History  Administered Date(s) Administered   Fluad Quad(high Dose 65+) 05/03/2019   Influenza Split 04/04/2014, 03/27/2015   Influenza, High Dose Seasonal PF 05/03/2019, 07/29/2020, 07/29/2021   Influenza,inj,quad, With Preservative 04/02/2014, 03/19/2015   Moderna Sars-Covid-2 Vaccination 03/07/2020, 04/04/2020   Pneumococcal Polysaccharide-23 11/04/2012, 02/04/2021   Zoster, Live 02/05/2020, 05/09/2020    Past Medical History:  Diagnosis Date   Anxiety    Arthritis  Cholecystitis, acute 12/20/2013   Lap chole 6/5   Cocaine abuse ([place])    Community acquired pneumonia 05/31/2018   COPD (chronic obstructive pulmonary disease) ([name])    Coronary atherosclerosis of native coronary artery    a. 03/09/2013 Cath/PCI: LM nl, LAD: 50p, 81m(2.5x16 promus DES), LCX nl, OM1 min irregs, LPL/LPDA diff dzs, RCA nondom, mod diff dzs, EF 55%.   Depression    Essential hypertension     GERD (gastroesophageal reflux disease)    Headache    Hepatitis Late 1970s   History of complete ray amputation of first toe of left foot ([facility] 04/24/2021   History of nephrolithiasis    History of pneumonia    History of stroke    Right MCA distribution, residual left-sided weakness   Hyperlipidemia    Metabolic encephalopathy    Noncompliance    Osteomyelitis of second toe of left foot ([facility] 04/24/2021   [place]al cell carcinoma ([facility]    Status post radical right nephrectomy August 2015   Respiratory failure with hypoxia ([facility]    Sepsis ([facility] 05/19/2019   Sleep apnea    On CPAP, 4L O2 no cpap at home yet   Type 2 diabetes mellitus ([facility] 2011    Tobacco History: Social History   Tobacco Use  Smoking Status Every Day   Packs/day: 1.00   Years: 41.00   Total pack years: 41.00   Types: Cigarettes   Start date: 05/03/1972   Passive exposure: Never  Smokeless Tobacco Never  Tobacco Comments   1/2 ppd 06/05/21   Ready to quit: Not Answered Counseling given: Not Answered Tobacco comments: 1/2 ppd 06/05/21   Outpatient Medications Prior to Visit  Medication Sig Dispense Refill   acetaminophen (TYLENOL) 325 MG tablet Take 650 mg by mouth See admin instructions. Take 650 mg every 3-4 hours as needed for pain.     ALPRAZolam (XANAX) 1 MG tablet Take 1 mg by mouth 3 (three) times daily as needed.     atorvastatin (LIPITOR) 20 MG tablet Take 20 mg by mouth at bedtime.  1   Budeson-Glycopyrrol-Formoterol (BREZTRI AEROSPHERE) 160-9-4.8 MCG/ACT AERO Inhale 2 puffs into the lungs in the morning and at bedtime. 10.7 g 5   budesonide (PULMICORT) 1 MG/2ML nebulizer solution Take 1 mg by nebulization in the morning, at noon, and at bedtime.     clopidogrel (PLAVIX) 75 MG tablet TAKE ONE TABLET BY MOUTH EVERY DAY 90 tablet 1   diltiazem (CARDIZEM CD) 180 MG 24 hr capsule TAKE ONE CAPSULE BY MOUTH EVERY DAY 90 capsule 2   diphenhydrAMINE (BENADRYL) 25 mg capsule Take 50 mg by mouth at  bedtime.     esomeprazole (NEXIUM) 20 MG capsule Take 20 mg by mouth daily at 12 noon.     FLUoxetine (PROZAC) 20 MG capsule Take 20 mg by mouth daily.     FLUoxetine (PROZAC) 40 MG capsule Take 40 mg by mouth daily.     formoterol (PERFOROMIST) 20 MCG/2ML nebulizer solution Take 20 mcg by nebulization as needed (shortness of breath).     gabapentin (NEURONTIN) 300 MG capsule Take 900 mg by mouth 3 (three) times daily.     glucose blood (TRUE METRIX BLOOD GLUCOSE TEST) test strip      insulin regular human CONCENTRATED (HUMULIN R) 500 UNIT/ML injection Inject 90 Units into the skin 3 (three) times daily with meals.     isosorbide mononitrate (IMDUR) 60 MG 24 hr tablet Take 1 tablet (60 mg total) by mouth  in the morning. & 30 mg in the evening 135 tablet 3   linagliptin (TRADJENTA) 5 MG TABS tablet Take 5 mg by mouth daily.     nitroGLYCERIN (NITROSTAT) 0.4 MG SL tablet Place 1 tablet (0.4 mg total) under the tongue every 5 (five) minutes x 3 doses as needed for chest pain (if no relief after 2nd dose, proceed to ED or call 911). 25 tablet 3   oxyCODONE-acetaminophen (PERCOCET) 10-325 MG tablet Take 1 tablet by mouth daily as needed for pain.     potassium chloride SA (K-DUR,KLOR-CON) 20 MEQ tablet Take 20 mEq by mouth 2 (two) times daily.      pregabalin (LYRICA) 25 MG capsule Take 25 mg by mouth 2 (two) times daily.     Probiotic Product (PROBIOTIC PO) Take 1 capsule by mouth daily.     tamsulosin (FLOMAX) 0.4 MG CAPS capsule Take 0.4 mg by mouth daily.      torsemide (DEMADEX) 20 MG tablet TAKE THREE TABLETS BY MOUTH TWICE DAILY (Patient taking differently: Take 60 mg by mouth 2 (two) times daily.) 540 tablet 0   zolpidem (AMBIEN) 5 MG tablet Take 5 mg by mouth at bedtime. For insomnia.     doxycycline (VIBRAMYCIN) 100 MG capsule Take 1 capsule (100 mg total) by mouth 2 (two) times daily. 20 capsule 0   No facility-administered medications prior to visit.     Review of Systems:    Constitutional: No weight loss or gain, night sweats, fevers, chills +fatigue, lassitude. HEENT: No headaches, difficulty swallowing, tooth/dental problems, or sore throat. No sneezing, itching, ear ache, nasal congestion, or post nasal drip CV:  +dizziness. No chest pain, orthopnea, PND, swelling in lower extremities, anasarca, palpitations, syncope Resp: +shortness of breath with exertion; productive cough; wheezing.  No hemoptysis. No chest wall deformity GI:  No heartburn, indigestion, abdominal pain, nausea, vomiting, diarrhea, change in bowel habits, loss of appetite, bloody stools.  GU: No dysuria, change in color of urine, urgency or frequency.  No flank pain, no hematuria  Skin: No rash, lesions, ulcerations MSK:  No joint pain or swelling.  No decreased range of motion.  No back pain. Neuro: No gait abnormalities, confusion Psych: No depression or anxiety. Mood stable.     Physical Exam:  BP 122/64 (BP Location: Right Arm, Patient Position: Sitting, Cuff Size: Normal)   Pulse (!) 101   Wt 215 lb (97.5 kg)   SpO2 94%   BMI 31.75 kg/m   GEN: Pleasant, interactive, chronically-ill appearing; obese; in no acute distress. HEENT:  Normocephalic and atraumatic. PERRLA. Sclera white. Nasal turbinates pink, moist and patent bilaterally. No rhinorrhea present. Oropharynx pink and moist, without exudate or edema. No lesions, ulcerations, or postnasal drip.  NECK:  Supple w/ fair ROM. No JVD present. Normal carotid impulses w/o bruits. Thyroid symmetrical with no goiter or nodules palpated. No lymphadenopathy.   CV: RRR, no m/r/g, no peripheral edema. Pulses intact, +2 bilaterally. No cyanosis, pallor or clubbing. PULMONARY:  Unlabored, regular breathing. Scattered wheezes bilaterally A&P. No accessory muscle use.  GI: BS present and normoactive. Soft, non-tender to palpation. No organomegaly or masses detected.  MSK: No erythema, warmth or tenderness. Cap refil <2 sec all extrem. No  deformities or joint swelling noted.  Neuro: A/Ox3. No focal deficits noted.   Skin: Warm, no lesions or rashe Psych: Normal affect and behavior. Judgement and thought content appropriate.     Lab Results:  CBC    Component Value Date/Time  WBC 7.8 05/24/2022 1842   RBC 5.31 05/24/2022 1842   HGB 14.3 05/24/2022 1842   HCT 43.8 05/24/2022 1842   PLT 221 05/24/2022 1842   MCV 82.5 05/24/2022 1842   MCH 26.9 05/24/2022 1842   MCHC 32.6 05/24/2022 1842   RDW 13.8 05/24/2022 1842   LYMPHSABS 1.5 05/24/2022 1842   MONOABS 0.5 05/24/2022 1842   EOSABS 0.1 05/24/2022 1842   BASOSABS 0.0 05/24/2022 1842    BMET    Component Value Date/Time   NA 137 05/24/2022 1842   K 3.9 05/24/2022 1842   CL 108 05/24/2022 1842   CO2 22 05/24/2022 1842   GLUCOSE 180 (H) 05/24/2022 1842   BUN 21 05/24/2022 1842   CREATININE 1.28 (H) 05/24/2022 1842   CREATININE 1.89 (H) 04/24/2021 1524   CALCIUM 8.8 (L) 05/24/2022 1842   GFRNONAA >60 05/24/2022 1842   GFRAA 42 (L) 04/12/2020 1348    BNP    Component Value Date/Time   BNP 84.2 04/12/2020 1348     Imaging:  DG Chest 2 View  Result Date: 06/26/2022 CLINICAL DATA:  Productive cough.  COPD EXAM: CHEST - 2 VIEW COMPARISON:  05/24/2022 FINDINGS: Mild cardiomegaly. Aortic atherosclerosis. Chronic pleural and parenchymal scarring on the left. Newly seen patchy infiltrate in the right lower lobe. Upper lungs remain clear. IMPRESSION: 1. Newly seen patchy infiltrate in the right lower lobe consistent with pneumonia. 2. Chronic pleural and parenchymal scarring on the left. Electronically Signed   By: Nelson Chimes M.D.   On: 06/26/2022 15:16         Latest Ref Rng & Units 09/04/2019   11:01 AM 09/08/2018   10:21 AM 09/07/2014   11:39 AM 04/13/2014   11:43 AM  PFT Results  FVC-Pre L 3.47  3.80  4.03  4.37   FVC-Predicted Pre % 74  81  83  90   FVC-Post L   4.29  4.43   FVC-Predicted Post %   89  92   Pre FEV1/FVC % % 77  76  81  82   Post  FEV1/FCV % %   83  83   FEV1-Pre L 2.68  2.88  3.28  3.58   FEV1-Predicted Pre % 77  82  90  97   FEV1-Post L   3.58  3.67   DLCO uncorrected ml/min/mmHg 14.15  13.84  16.41  17.92   DLCO UNC% % 52  51  50  55   DLVA Predicted % 71  62  57  63   TLC L   6.36  6.11   TLC % Predicted %   90  87   RV % Predicted %   85  76     No results found for: "NITRICOXIDE"      Assessment & Plan:   COPD with acute exacerbation ([name]) AECOPD, which I suspect is contributing to his dizziness and weakness. He was treated for pneumonia in October with doxycycline course. CT from 11/5 with resolution. We will obtain CXR today to rule out superimposed infection. He is on multiple sedating medications so higher risk for aspiration. Recommended we treat him with prednisone taper and empiric augmentin 7 day course. He will target mucociliary clearance therapies as well. Strict return/ED precautions. Continue triple therapy regimen.  Patient Instructions  Continue Breztri 2 puffs Twice daily. Brush tongue and rinse mouth afterwards Continue Albuterol inhaler 2 puffs or 3 mL neb every 6 hours as needed for shortness of breath or  wheezing. Notify if symptoms persist despite rescue inhaler/neb use.  Continue nexium 1 tab daily Continue supplemental oxygen 5-6 lpm for goal >88-90%. You need to be using your continuous tanks when you are out of the house instead of your POC unit.  Prednisone taper. 4 tabs for 2 days, then 3 tabs for 2 days, 2 tabs for 2 days, then 1 tab for 2 days, then stop. Take in AM with food. Augmentin 1 tab Twice daily for 7 days. Take with food. Take daily probiotic while taking  Guaifenesin 1 tab Twice daily for chest congestion   Sputum culture today   Obtain follow up with cardiology   Chest x ray today   Follow up in 7-10 days with Dr. Chase Caller or Alanson Aly. If symptoms do not improve or worsen, please contact office for sooner follow up or seek emergency care.    Chronic  respiratory failure with hypoxia ([name]) Stable without increased O2 requirement. He is maintained on 5-6 lpm. Having some trouble with his POC machine today. He has to cut the machine off and back on again frequently as he keeps getting an error message. Has requested assistance of the DME for troubleshooting without response. He was able to maintain saturations >90% on 5 lpm POC today; although, I think he may receive more benefit from continuous usage given his severe lung disease. Advised he use his continuous tanks when out of the house until seen back. We will also send orders to troubleshoot his machine. Goal >88-90%  Dizziness Not a new problem. Seems to be worse over the past month or two. EKG unchanged when compared to 11/5. Orthostatics without 20 mmHg change. Possibly related to chronic lung disease and hypoxic respiratory failure. We will reassess at follow up. Did encourage him to reach out to cardiology for follow up to rule out cardiac component.    I spent 42 minutes of dedicated to the care of this patient on the date of this encounter to include pre-visit review of records, face-to-face time with the patient discussing conditions above, post visit ordering of testing, clinical documentation with the electronic health record, making appropriate referrals as documented, and communicating necessary findings to members of the patients care team.  Clayton Bibles, NP 06/26/2022  Pt aware and understands NP's role.

## 2022-06-26 NOTE — Assessment & Plan Note (Addendum)
Not a new problem. Seems to be worse over the past month or two. EKG unchanged when compared to 11/5. Orthostatics without 20 mmHg change. Possibly related to chronic lung disease and hypoxic respiratory failure. We will reassess at follow up. Did encourage him to reach out to cardiology for follow up to rule out cardiac component. ED precautions.

## 2022-06-26 NOTE — Assessment & Plan Note (Signed)
Stable without increased O2 requirement. He is maintained on 5-6 lpm. Having some trouble with his POC machine today. He has to cut the machine off and back on again frequently as he keeps getting an error message. Has requested assistance of the DME for troubleshooting without response. He was able to maintain saturations >90% on 5 lpm POC today; although, I think he may receive more benefit from continuous usage given his severe lung disease. Advised he use his continuous tanks when out of the house until seen back. We will also send orders to troubleshoot his machine. Goal >88-90%

## 2022-06-26 NOTE — Assessment & Plan Note (Addendum)
AECOPD, which I suspect is contributing to his dizziness and weakness. He was treated for pneumonia in October with doxycycline course. CT from 11/5 with resolution. We will obtain CXR today to rule out superimposed infection. He is on multiple sedating medications so higher risk for aspiration. Recommended we treat him with prednisone taper and empiric augmentin 7 day course. He will target mucociliary clearance therapies as well. Strict return/ED precautions. Continue triple therapy regimen.  Patient Instructions  Continue Breztri 2 puffs Twice daily. Brush tongue and rinse mouth afterwards Continue Albuterol inhaler 2 puffs or 3 mL neb every 6 hours as needed for shortness of breath or wheezing. Notify if symptoms persist despite rescue inhaler/neb use.  Continue nexium 1 tab daily Continue supplemental oxygen 5-6 lpm for goal >88-90%. You need to be using your continuous tanks when you are out of the house instead of your POC unit.  Prednisone taper. 4 tabs for 2 days, then 3 tabs for 2 days, 2 tabs for 2 days, then 1 tab for 2 days, then stop. Take in AM with food. Augmentin 1 tab Twice daily for 7 days. Take with food. Take daily probiotic while taking  Guaifenesin 1 tab Twice daily for chest congestion   Sputum culture today   Obtain follow up with cardiology   Chest x ray today   Follow up in 7-10 days with Dr. Chase Caller or Alanson Aly. If symptoms do not improve or worsen, please contact office for sooner follow up or seek emergency care.

## 2022-06-26 NOTE — Progress Notes (Signed)
Notified pt of RLL pneumonia. He was started on augmentin at our visit. Instructed him to complete this course and collect sputum culture, if able. Encouraged him to target mucociliary clearance therapies. Strict ED precautions. Verbalized understanding.

## 2022-06-26 NOTE — Patient Instructions (Addendum)
Continue Breztri 2 puffs Twice daily. Brush tongue and rinse mouth afterwards Continue Albuterol inhaler 2 puffs or 3 mL neb every 6 hours as needed for shortness of breath or wheezing. Notify if symptoms persist despite rescue inhaler/neb use.  Continue nexium 1 tab daily Continue supplemental oxygen 5-6 lpm for goal >88-90%. You need to be using your continuous tanks when you are out of the house instead of your POC unit.  Prednisone taper. 4 tabs for 2 days, then 3 tabs for 2 days, 2 tabs for 2 days, then 1 tab for 2 days, then stop. Take in AM with food. Augmentin 1 tab Twice daily for 7 days. Take with food. Take daily probiotic while taking  Guaifenesin 1 tab Twice daily for chest congestion   Sputum culture today   Obtain follow up with cardiology   Chest x ray today   Follow up in 7-10 days with Dr. Chase Caller or Alanson Aly. If symptoms do not improve or worsen, please contact office for sooner follow up or seek emergency care.

## 2022-07-04 ENCOUNTER — Other Ambulatory Visit: Payer: Self-pay | Admitting: Internal Medicine

## 2022-08-06 ENCOUNTER — Telehealth: Payer: Self-pay | Admitting: Cardiology

## 2022-08-06 DIAGNOSIS — Z89412 Acquired absence of left great toe: Secondary | ICD-10-CM | POA: Diagnosis not present

## 2022-08-06 DIAGNOSIS — I5032 Chronic diastolic (congestive) heart failure: Secondary | ICD-10-CM | POA: Diagnosis not present

## 2022-08-06 DIAGNOSIS — E1151 Type 2 diabetes mellitus with diabetic peripheral angiopathy without gangrene: Secondary | ICD-10-CM | POA: Diagnosis not present

## 2022-08-06 DIAGNOSIS — I959 Hypotension, unspecified: Secondary | ICD-10-CM | POA: Diagnosis not present

## 2022-08-06 DIAGNOSIS — E1140 Type 2 diabetes mellitus with diabetic neuropathy, unspecified: Secondary | ICD-10-CM | POA: Diagnosis not present

## 2022-08-06 DIAGNOSIS — Z9181 History of falling: Secondary | ICD-10-CM | POA: Diagnosis not present

## 2022-08-06 NOTE — Telephone Encounter (Signed)
Pt c/o BP issue: STAT if pt c/o blurred vision, one-sided weakness or slurred speech  1. What are your last 5 BP readings?   62/42 in office today 60/35 with their machine Past 6-8 months has been consistently low  2. Are you having any other symptoms (ex. Dizziness, headache, blurred vision, passed out)?   Dizziness, pain in his lungs, SOB  3. What is your BP issue?    Caller stated patient's blood pressure is reading very low and they want to consult to address this.  Patient is on oxygen.

## 2022-08-06 NOTE — Telephone Encounter (Signed)
Spoke with Engineer, maintenance at PCP office who reports low BP's while in office. 62/42 & 60/35. Suggested ED and says they are directing patient to ED for evaluation. Per [name], MD asking about medication changes and adjustment recommendations. Advised that ED providers would sort out and make adjustments as needed once stable.

## 2022-08-07 DIAGNOSIS — E1140 Type 2 diabetes mellitus with diabetic neuropathy, unspecified: Secondary | ICD-10-CM | POA: Diagnosis not present

## 2022-08-07 DIAGNOSIS — Z794 Long term (current) use of insulin: Secondary | ICD-10-CM | POA: Diagnosis not present

## 2022-08-10 ENCOUNTER — Telehealth: Payer: Self-pay | Admitting: Cardiology

## 2022-08-10 NOTE — Telephone Encounter (Signed)
  Pt c/o BP issue: STAT if pt c/o blurred vision, one-sided weakness or slurred speech  1. What are your last 5 BP readings? Today 98/62 While at [place] its 62/42  2. Are you having any other symptoms (ex. Dizziness, headache, blurred vision, passed out)?   3. What is your BP issue? [facility] called back, she is following from her call last week. She said, she reached out to pt today and pt did not go to ED and BP today is at 98/62. Pt's pcp is still concern and still waiting for Dr. Myles Gip recommendation.

## 2022-08-11 ENCOUNTER — Telehealth: Payer: Self-pay | Admitting: Cardiology

## 2022-08-11 ENCOUNTER — Other Ambulatory Visit: Payer: Self-pay | Admitting: *Deleted

## 2022-08-11 MED ORDER — MIDODRINE HCL 2.5 MG PO TABS: 2.5000 mg | ORAL_TABLET | Freq: Two times a day (BID) | ORAL | 6 refills | Status: DC

## 2022-08-11 NOTE — Telephone Encounter (Signed)
Pt c/o medication issue:  1. Name of Medication:   isosorbide mononitrate (IMDUR) 60 MG 24 hr tablet   2. How are you currently taking this medication (dosage and times per day)?   As prescribed  3. Are you having a reaction (difficulty breathing--STAT)?   No  4. What is your medication issue?   Patient stated he wanted to know if he should stop taking this medication as he was started on new medication.

## 2022-08-11 NOTE — Telephone Encounter (Signed)
Advised that no meds were stopped when midodrine was added. Verbalized understanding.

## 2022-08-11 NOTE — Telephone Encounter (Signed)
Prescription resent as message received that initial one failed to go through.

## 2022-08-11 NOTE — Telephone Encounter (Signed)
Notified aide Fredrich Birks) with verbal permission from the patient.  Will send new medication to pharmacy now.  Follow up appointment given for 08/27/2022 with Finis Bud, NP in [place] office.  Suggested he keep log of BP readings & bring to that visit for provider review.  Copy of this note forwarded to pcp as well.

## 2022-08-20 ENCOUNTER — Other Ambulatory Visit: Payer: 59

## 2022-08-20 ENCOUNTER — Telehealth: Payer: Self-pay | Admitting: Nurse Practitioner

## 2022-08-20 ENCOUNTER — Ambulatory Visit: Payer: 59 | Admitting: Podiatry

## 2022-08-20 NOTE — Telephone Encounter (Signed)
Viral illness so supportive care measures to address the symptoms unless he is having AECOPD. Please determine what his symptoms are. Thanks.

## 2022-08-20 NOTE — Telephone Encounter (Signed)
[name] from [place] @ [phone] calling to say [name]'s mom tested poss for RSV. They wanted Dr. And NP to know he feels as though he is coming down with it as well.

## 2022-08-20 NOTE — Telephone Encounter (Signed)
[facility] called [place] to let us know that patients mom tested positive for RSV and now he feels like he is coming down with is as well.  Recommendations?

## 2022-08-20 NOTE — Telephone Encounter (Signed)
Attempted to call patient but line is busy. Will try again

## 2022-08-21 NOTE — Telephone Encounter (Signed)
ATC X1 unable to leave a voicemail. Line just kept ringing. Will try to call patient back later

## 2022-08-21 NOTE — Telephone Encounter (Signed)
ATC patient this morning to check on symptoms but no voicemail picked up. Line kept ringing. Will try again

## 2022-08-25 NOTE — Telephone Encounter (Signed)
Spoke with the pt and notified of response per Joellen Jersey. He states that he has had PNA recently, and still having some cough with clear sputum. I scheduled him for ROV with Katie for 09/04/22  Refused sooner appt since he has a lot of other appts scheduled for this wk  Advised seek emergent care sooner if needed

## 2022-08-27 ENCOUNTER — Ambulatory Visit: Payer: 59 | Attending: Nurse Practitioner | Admitting: Nurse Practitioner

## 2022-08-27 VITALS — BP 102/52 | HR 84 | Ht 69.0 in | Wt 216.0 lb

## 2022-08-27 DIAGNOSIS — J449 Chronic obstructive pulmonary disease, unspecified: Secondary | ICD-10-CM

## 2022-08-27 DIAGNOSIS — R0602 Shortness of breath: Secondary | ICD-10-CM | POA: Diagnosis not present

## 2022-08-27 DIAGNOSIS — I10 Essential (primary) hypertension: Secondary | ICD-10-CM | POA: Diagnosis not present

## 2022-08-27 DIAGNOSIS — N189 Chronic kidney disease, unspecified: Secondary | ICD-10-CM | POA: Diagnosis not present

## 2022-08-27 DIAGNOSIS — E785 Hyperlipidemia, unspecified: Secondary | ICD-10-CM | POA: Diagnosis not present

## 2022-08-27 DIAGNOSIS — I5032 Chronic diastolic (congestive) heart failure: Secondary | ICD-10-CM | POA: Diagnosis not present

## 2022-08-27 DIAGNOSIS — I25119 Atherosclerotic heart disease of native coronary artery with unspecified angina pectoris: Secondary | ICD-10-CM | POA: Diagnosis not present

## 2022-08-27 MED ORDER — RANOLAZINE ER 500 MG PO TB12: 500.0000 mg | ORAL_TABLET | Freq: Two times a day (BID) | ORAL | 6 refills | Status: DC

## 2022-08-27 NOTE — Patient Instructions (Addendum)
Medication Instructions:  Begin Ranexa '500mg'$  twice a day   Continue all other current medications.   Labwork: none  Testing/Procedures: Your physician has requested that you have an echocardiogram. Echocardiography is a painless test that uses sound waves to create images of your heart. It provides your doctor with information about the size and shape of your heart and how well your heart's chambers and valves are working. This procedure takes approximately one hour. There are no restrictions for this procedure. Please do NOT wear cologne, perfume, aftershave, or lotions (deodorant is allowed). Please arrive 15 minutes prior to your appointment time.  Office will contact with results via phone, letter or mychart.     Follow-Up: 4-6 weeks   Any Other Special Instructions Will Be Listed Below (If Applicable).   If you need a refill on your cardiac medications before your next appointment, please call your pharmacy.

## 2022-08-27 NOTE — Progress Notes (Signed)
Cardiology Office Note:    Date:  08/27/2022  ID:  Kevin, [name] 1956-02-11, MRN [id number]  PCP:  Larey Dresser, Girtha Rm, [place] [facility] Cardiologist:  [name], MD     Referring MD: Larey Dresser Girtha Rm, FNP   CC: Worsening shortness of breath and random/rare episodes of CP  History of Present Illness:    [name] is a 67 y.o. male with a hx of the following:  CAD Hypertension Hyperlipidemia Type 2 diabetes OSA on CPAP History of stroke History of noncompliance History of substance abuse History of renal cell carcinoma, s/p right nephrectomy in 2015 History of hepatitis COPD  Patient is a 67 year old male with past medical history as mentioned above.  CT of chest in 2022 showed air trapping consistent with small airway diseases, pleuroparenchymal scarring and volume loss in left lower hemithorax, aortic atherosclerosis with coronary artery calcification, evidence of emphysema.  Echocardiogram at that time revealed EF 55 to 60%, grade 1 DD, mild LVH, dilated IVC, suggestive RAP of 8 mmHg.  Last seen by Dr. Domenic Polite on March 27, 2022.  Was being followed by palliative care.  Predominant issue is COPD.  Last CT of chest was negative for evidence of ILD.  Was not on beta-blocker given severe lung disease.  Continued on continuous oxygen.  No medication changes were made.  Still to follow-up in 6 months.  Today he presents for follow-up. CC is worsening shortness of breath and rare "not often" episodes of chest pain. Says shortness of breath is constant, feels as though his breathing is worsening. Used to wear O2 only at night and is now is wearing O2 continuously. Admits to random, rare episodes of chest pain, described as pressure, as elephant sitting on chest, noticed at rest and not associated with exertion, around once per week. His symptoms are improved with long acting nitrate and NG, has been stable over time. Reports sleeping on 2-3 pillows  at night. Admits to fatigue and denies any palpitations, syncope, presyncope, dizziness, PND, swelling or significant weight changes, acute bleeding, or claudication. Tolerating medications well. He denies any chest pain today.  Past Medical History:  Diagnosis Date   Anxiety    Arthritis    Cholecystitis, acute 12/20/2013   Lap chole 6/5   Cocaine abuse ([place])    Community acquired pneumonia 05/31/2018   COPD (chronic obstructive pulmonary disease) ([name])    Coronary atherosclerosis of native coronary artery    a. 03/09/2013 Cath/PCI: LM nl, LAD: 50p, 88m(2.5x16 promus DES), LCX nl, OM1 min irregs, LPL/LPDA diff dzs, RCA nondom, mod diff dzs, EF 55%.   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 [place] 04/24/2021   History of nephrolithiasis    History of pneumonia    History of stroke    Right MCA distribution, residual left-sided weakness   Hyperlipidemia    Metabolic encephalopathy    Noncompliance    Osteomyelitis of second toe of left foot ([place] 04/24/2021   Renal cell carcinoma ([place]    Status post radical right nephrectomy August 2015   Respiratory failure with hypoxia ([place]    [place]epsis [place] 05/19[place]   Sleep apnea    On CPAP, 4L O2 no cpap at home yet   Type 2 diabetes mellitus ([place] 2011    Past Surgical  History:  Procedure Laterality Date   ABDOMINAL AORTOGRAM W/LOWER EXTREMITY Left 07/17/2021   Procedure: ABDOMINAL AORTOGRAM W/LOWER EXTREMITY;  Surgeon: Serafina Mitchell, MD;  Location: [place] [facility];  Service: Cardiovascular;  Laterality: Left;   AMPUTATION Left 12/27/2020   Procedure: PARTIAL FIRST RAY AMPUTATION OF FOOT;  Surgeon: Felipa Furnace, DPM;  Location: [place];  Service: Podiatry;  Laterality: Left;   APPENDECTOMY  1970's   CHOLECYSTECTOMY N/A 12/22/2013   Procedure: LAPAROSCOPIC CHOLECYSTECTOMY ;  Surgeon: [name], MD;  Location: [place];  Service: General;  Laterality: N/A;   ENDARTERECTOMY Right 10/08/2014   Procedure: Right ENDARTERECTOMY CAROTID;  Surgeon: [name], MD;  Location: [place];  Service: Vascular;  Laterality: Right;   LAPAROSCOPIC LYSIS OF ADHESIONS  02/21/2014   Procedure: LAPAROSCOPIC LYSIS OF ADHESIONS EXTINSIVE;  Surgeon: [name], MD;  [facility];  Service: Urology;;   LEFT HEART CATHETERIZATION WITH CORONARY ANGIOGRAM N/A 06/02/2012   Procedure: LEFT HEART CATHETERIZATION WITH CORONARY ANGIOGRAM;  Surgeon: [name], MD;  [facility] C[facility] LAB;  Service: Cardiovascular;  Laterality: N/A;   LEFT HEART CATHETERIZATION WITH CORONARY ANGIOGRAM N/A 03/09/2013   Procedure: LEFT HEART CATHETERIZATION WITH CORONARY ANGIOGRAM;  Surgeon: [name], MD;  Location: [facility] CATH LAB;  Service: Cardiovascular;  Laterality: N/A;   LEFT HEART CATHETERIZATION WITH CORONARY ANGIOGRAM N/A 12/20/2013   Procedure: LEFT HEART CATHETERIZATION WITH CORONARY ANGIOGRAM;  Surgeon: [name], MD;  Location: [facility] CATH LAB;  Service: Cardiovascular;  Laterality: N/A;   LUNG BIOPSY Left 05/18/2014   Procedure: LUNG BIOPSY left upper lobe & left lower lobe;  Surgeon: [name], MD;  Location: [place];  Service: Thoracic;  Laterality: Left;   PATCH ANGIOPLASTY Right 10/08/2014   Procedure: PATCH ANGIOPLASTY Right Carotid;  Surgeon: [name], MD;  Location: [place];  Service: Vascular;  Laterality: Right;   PERIPHERAL VASCULAR INTERVENTION Left 07/17/2021   Procedure: PERIPHERAL VASCULAR INTERVENTION;  Surgeon: Serafina Mitchell, MD;  Location: [place] [facility];  Service: Cardiovascular;  Laterality: Left;  Superficial Femoral Artery   ROBOT ASSISTED LAPAROSCOPIC NEPHRECTOMY Right 02/21/2014   Procedure: ROBOTIC ASSISTED LAPAROSCOPIC RIGHT NEPHRECTOMY ;  Surgeon: [name], MD;  [facility];  Service: Urology;  Laterality: Right;   VIDEO ASSISTED THORACOSCOPY Left  05/18/2014   Procedure: LEFT VIDEO ASSISTED THORACOSCOPY;  Surgeon: [name], MD;  Location: [place];  Service: Thoracic;  Laterality: Left;   VIDEO BRONCHOSCOPY  05/18/2014   Procedure: VIDEO BRONCHOSCOPY;  Surgeon: [name], MD;  Location: [place];  Service: Thoracic;;    Current Medications: Current Meds  Medication Sig   acetaminophen (TYLENOL) 325 MG tablet Take 650 mg by mouth See admin instructions. Take 650 mg every 3-4 hours as needed for pain.   ALPRAZolam (XANAX) 1 MG tablet Take 1 mg by mouth 3 (three) times daily as needed.   atorvastatin (LIPITOR) 20 MG tablet Take 20 mg by mouth at bedtime.   BREZTRI AEROSPHERE 160-9-4.8 MCG/ACT AERO Inhale 2 puffs into the lungs in the morning and at bedtime.   budesonide (PULMICORT) 1 MG/2ML nebulizer solution Take 1 mg by nebulization in the morning, at noon, and at bedtime.   clopidogrel (PLAVIX) 75 MG tablet TAKE ONE TABLET BY MOUTH EVERY DAY   diltiazem (CARDIZEM CD) 180 MG 24 hr capsule TAKE ONE CAPSULE BY MOUTH EVERY DAY   diphenhydrAMINE (BENADRYL) 25 mg capsule Take 50 mg by mouth at bedtime.  esomeprazole (NEXIUM) 20 MG capsule Take 20 mg by mouth daily at 12 noon.   FLUoxetine (PROZAC) 20 MG capsule Take 20 mg by mouth daily.   FLUoxetine (PROZAC) 40 MG capsule Take 40 mg by mouth daily.   formoterol (PERFOROMIST) 20 MCG/2ML nebulizer solution Take 20 mcg by nebulization as needed (shortness of breath).   gabapentin (NEURONTIN) 300 MG capsule Take 900 mg by mouth 3 (three) times daily.   glucose blood (TRUE METRIX BLOOD GLUCOSE TEST) test strip    insulin regular human CONCENTRATED (HUMULIN R) 500 UNIT/ML injection Inject 90 Units into the skin 3 (three) times daily with meals.   isosorbide mononitrate (IMDUR) 60 MG 24 hr tablet Take 1 tablet (60 mg total) by mouth in the morning. & 30 mg in the evening   linagliptin (TRADJENTA) 5 MG TABS tablet Take 5 mg by mouth daily.   midodrine (PROAMATINE) 2.5 MG tablet  Take 1 tablet (2.5 mg total) by mouth 2 (two) times daily.   nitroGLYCERIN (NITROSTAT) 0.4 MG SL tablet Place 1 tablet (0.4 mg total) under the tongue every 5 (five) minutes x 3 doses as needed for chest pain (if no relief after 2nd dose, proceed to ED or call 911).   oxyCODONE-acetaminophen (PERCOCET) 10-325 MG tablet Take 1 tablet by mouth daily as needed for pain.   potassium chloride SA (K-DUR,KLOR-CON) 20 MEQ tablet Take 20 mEq by mouth 2 (two) times daily.    pregabalin (LYRICA) 25 MG capsule Take 25 mg by mouth 2 (two) times daily.   Probiotic Product (PROBIOTIC PO) Take 1 capsule by mouth daily.   tamsulosin (FLOMAX) 0.4 MG CAPS capsule Take 0.4 mg by mouth daily.    torsemide (DEMADEX) 20 MG tablet TAKE THREE TABLETS BY MOUTH TWICE DAILY (Patient taking differently: Take 60 mg by mouth 2 (two) times daily.)   zolpidem (AMBIEN) 5 MG tablet Take 5 mg by mouth at bedtime. For insomnia.     Allergies:   Lisinopril, Pollen extract, and Oxycodone   Social History   Socioeconomic History   Marital status: Single    Spouse name: Not on file   Number of children: Not on file   Years of education: Not on file   Highest education level: Not on file  Occupational History   Occupation: N/A  Tobacco Use   Smoking status: Every Day    Packs/day: 1.00    Years: 41.00    Total pack years: 41.00    Types: Cigarettes    Start date: 05/03/1972    Passive exposure: Never   Smokeless tobacco: Never   Tobacco comments:    1/2 ppd 06/05/21  Vaping Use   Vaping Use: Never used  Substance and Sexual Activity   Alcohol use: No    Alcohol/week: 0.0 standard drinks of alcohol   Drug use: No    Types: Cocaine    Comment: last used cocaine 11/2013. but is cocaine + this admit July 2015   Sexual activity: Never  Other Topics Concern   Not on file  Social History Narrative   Lives in [place] with his mother.  He does not work.   Social Determinants of Health   Financial Resource Strain:  Low Risk  (05/19/2019)   Overall Financial Resource Strain (CARDIA)    Difficulty of Paying Living Expenses: Not hard at all  Food Insecurity: No Food Insecurity (04/24/2022)   Hunger Vital Sign    Worried About Running Out of Food in the Last Year: Never  true    Ran Out of Food in the Last Year: Never true  Transportation Needs: No Transportation Needs (05/19/2019)   PRAPARE - Hydrologist (Medical): No    Lack of Transportation (Non-Medical): No  Physical Activity: Inactive (05/19/2019)   Exercise Vital Sign    Days of Exercise per Week: 0 days    Minutes of Exercise per Session: 0 min  Stress: No Stress Concern Present (05/19/2019)   Jay    Feeling of Stress : Not at all  Social Connections: Unknown (05/19/2019)   Social Connection and Isolation Panel [NHANES]    Frequency of Communication with Friends and Family: More than three times a week    Frequency of Social Gatherings with Friends and Family: More than three times a week    Attends Religious Services: More than 4 times per year    Active Member of [employer] or Organizations: Yes    Attends Music therapist: More than 4 times per year    Marital Status: Not on file     Family History: The patient's family history includes CAD in his brother and father; Cancer in his maternal grandfather, maternal grandmother, and mother; Diabetes in an other family member; Hypertension in an other family member; Lung cancer in his father; Stroke in an other family member.  ROS:   Review of Systems  Constitutional:  Positive for malaise/fatigue. Negative for chills, diaphoresis, fever and weight loss.  HENT: Negative.    Eyes: Negative.   Respiratory:  Positive for shortness of breath. Negative for cough, hemoptysis, sputum production and wheezing.   Cardiovascular:  Positive for chest pain and orthopnea. Negative for  palpitations, claudication, leg swelling and PND.  Gastrointestinal: Negative.   Genitourinary: Negative.   Musculoskeletal: Negative.   Skin: Negative.   Neurological: Negative.   Endo/Heme/Allergies: Negative.   Psychiatric/Behavioral: Negative.      Please see the history of present illness.    All other systems reviewed and are negative.  EKGs/Labs/Other Studies Reviewed:    The following studies were reviewed today:   EKG:  EKG dated 06/26/2022 reviewed and revealed ST, 104 bpm, LAFB.   Carotid duplex on 09/01/2021: Summary:  Right Carotid: Velocities in the right ICA are consistent with a 40-59% stenosis. Hemodynamically significant plaque >50%  visualized in the CCA. The ECA appears >50% stenosed.   Left Carotid: Evidence consistent with a total occlusion of the left ICA.  The ECA appears >50% stenosed.   Vertebrals:  Right vertebral artery demonstrates antegrade flow. Left  vertebral artery demonstrates atypical flow.  Subclavians: Left subclavian artery was stenotic. Normal flow hemodynamics  were seen in the right subclavian artery.  Echocardiogram on 12/06/20:  1. Left ventricular ejection fraction, by estimation, is 55 to 60%. The  left ventricle has normal function. The left ventricle has no regional  wall motion abnormalities. There is mild left ventricular hypertrophy.  Left ventricular diastolic parameters  are consistent with Grade I diastolic dysfunction (impaired relaxation).   2. Right ventricular systolic function is normal. The right ventricular  size is normal. Tricuspid regurgitation signal is inadequate for assessing  PA pressure.   3. Left atrial size was mildly dilated.   4. The mitral valve is grossly normal. Trivial mitral valve  regurgitation.   5. The aortic valve is tricuspid. Aortic valve regurgitation is not  visualized.   6. Mildly dilated pulmonary artery.   7. The inferior  vena cava is dilated in size with >50% respiratory   variability, suggesting right atrial pressure of 8 mmHg.   Comparison(s): Prior images unable to be directly viewed, comparison made  by report only. Changes from prior study are noted. 07/05/2015: LVEF  60-65%.   Recent Labs: 05/24/2022: ALT 18; BUN 21; Creatinine, Ser 1.28; Hemoglobin 14.3; Platelets 221; Potassium 3.9; Sodium 137  Recent Lipid Panel    Component Value Date/Time   CHOL 128 12/19/2013 0220   TRIG 152 (H) 12/19/2013 0220   HDL 31 (L) 12/19/2013 0220   CHOLHDL 4.1 12/19/2013 0220   VLDL 30 12/19/2013 0220   LDLCALC 67 12/19/2013 0220     Physical Exam:    VS:  BP (!) 102/52   Pulse 84   Ht 5' 9"$  (1.753 m)   Wt 216 lb (98 kg)   SpO2 95%   BMI 31.90 kg/m     Wt Readings from Last 3 Encounters:  08/27/22 216 lb (98 kg)  06/26/22 215 lb (97.5 kg)  05/24/22 224 lb 13.9 oz (102 kg)     GEN: Obese, 67 y.o. male, fatigued and short of breath with exertion while on continuous O2, not in acute distress HEENT: Normal NECK: No JVD; No carotid bruits CARDIAC: S1/S2, RRR, no murmurs, rubs, gallops; 2+ pulses RESPIRATORY:  Clear to auscultation without rales, wheezing or rhonchi  MUSCULOSKELETAL:  No edema; No deformity  SKIN: Warm and dry NEUROLOGIC:  Alert and oriented x 3 PSYCHIATRIC:  Normal affect   ASSESSMENT:    1. Coronary artery disease involving native heart with angina pectoris, unspecified vessel or lesion type ([place])   2. Chronic heart failure with preserved ejection fraction ([place])   3. Essential hypertension, benign   4. Hyperlipidemia, unspecified hyperlipidemia type   5. Chronic kidney disease, unspecified CKD stage   6. Chronic obstructive pulmonary disease, unspecified COPD type ([place])   7. SOB (shortness of breath)    PLAN:    In order of problems listed above:  CAD History of prior LAD intervention and patent stent seen in 2015. Has moderate RCA and Cx dx that has been medically managed. Rare, episodes of CP, not associated with  exertion, occurring at rest. Etiology multifactorial, believe his severe COPD/ history HFpEF is also contributing to these symptoms. Current breathing status prevents from ischemic evaluation at this time. Not having any current chest pain now and not in acute distress, chronically ill-appearing according to Dr. Myles Gip documentation, which I am in agreement with. BP today prevents further titration of Imdur. Will add Ranexa 500 mg BID to medication regimen. Will update Echocardiogram at this time. Not on BB given his severe COPD. Continue Plavix, Lipitor, Cardizem, and Imdur and NG PRN. Discussed NG use with him. ED precautions discussed. Will route note to Dr. Domenic Polite. Low threshold to consider right and left heart cath if symptoms to not improve, will need to make sure patient is volume optimized and symptoms have improved prior to undergoing procedure - see below.  HFpEF Echocardiogram 11/2020 showed preserved EF, no RWMA, mild LVH, grade 1 DD. Euvolemic and well compensated on exam; however admits to worsening SHOB and 3 pillow orthopnea. Will update Echo at this time. Continue current medication regimen. Low sodium diet, fluid restriction <2L, and daily weights encouraged. Educated to contact our office for weight gain of 2 lbs overnight or 5 lbs in one week.  Hypertension BP soft today at 102/52. Continue current medication regimen. Adding Ranexa as mentioned above with current  BP. Discussed to monitor BP at home at least 2 hours after medications and sitting for 5-10 minutes. Low salt, heart healthy diet recommended.  Hyperlipidemia No recent lipid panel on file. Will request at next OV. Continue current medications. Heart healthy diet recommended.   History of renal cell carcinoma, s/p right nephrectomy in 2015; history of CKD Most recent sCr showed improved renal function at 1.28 with eGFR > 60. Continue to follow with PCP.   COPD Chief complaint today is worsening respiratory status.  Having DOE on exam today while talking to me during interview. On continuous oxygen. Chronically ill-appearing according to documentation by Dr. Domenic Polite. Continue to follow-up with Dr. Chase Caller and Palliative Care.   7. Disposition: Follow-up with me or APP in 4-6 weeks or sooner if anything changes. ED precautions were discussed.    Medication Adjustments/Labs and Tests Ordered: Current medicines are reviewed at length with the patient today.  Concerns regarding medicines are outlined above.  Orders Placed This Encounter  Procedures   ECHOCARDIOGRAM COMPLETE   Meds ordered this encounter  Medications   ranolazine (RANEXA) 500 MG 12 hr tablet    Sig: Take 1 tablet (500 mg total) by mouth 2 (two) times daily.    Dispense:  60 tablet    Refill:  6    New 08/27/2022    Patient Instructions  Medication Instructions:  Begin Ranexa 565m twice a day   Continue all other current medications.   Labwork: none  Testing/Procedures: Your physician has requested that you have an echocardiogram. Echocardiography is a painless test that uses sound waves to create images of your heart. It provides your doctor with information about the size and shape of your heart and how well your heart's chambers and valves are working. This procedure takes approximately one hour. There are no restrictions for this procedure. Please do NOT wear cologne, perfume, aftershave, or lotions (deodorant is allowed). Please arrive 15 minutes prior to your appointment time.  Office will contact with results via phone, letter or mychart.     Follow-Up: 4-6 weeks   Any Other Special Instructions Will Be Listed Below (If Applicable).   If you need a refill on your cardiac medications before your next appointment, please call your pharmacy.    Signed, EFinis Bud NP  08/29/2022 11:22 AM    [place]

## 2022-09-01 ENCOUNTER — Ambulatory Visit (INDEPENDENT_AMBULATORY_CARE_PROVIDER_SITE_OTHER): Payer: 59 | Admitting: Podiatry

## 2022-09-01 DIAGNOSIS — M79674 Pain in right toe(s): Secondary | ICD-10-CM

## 2022-09-01 DIAGNOSIS — B351 Tinea unguium: Secondary | ICD-10-CM

## 2022-09-01 NOTE — Progress Notes (Signed)
  Subjective:  Patient ID: [name], male    DOB: Mar 13, 1956,  MRN: [id number]  Chief Complaint  Patient presents with   Nail Problem   68 y.o. male returns for the above complaint.  Patient presents with thickened elongated dystrophic toenails x 5 to the right side.  He has a PMH of the left side.  Denies any other acute close he would like for me to debride down he has not seen anyone else prior to seeing me.  Objective:  There were no vitals filed for this visit. Podiatric Exam: Vascular: dorsalis pedis and posterior tibial pulses are palpable bilateral. Capillary return is immediate. Temperature gradient is WNL. Skin turgor WNL  Sensorium: Normal Semmes Weinstein monofilament test. Normal tactile sensation bilaterally. Nail Exam: Pt has thick disfigured discolored nails with subungual debris noted  entire nail hallux through fifth toenail on the right side pain on palpation to the nails. Ulcer Exam: There is no evidence of ulcer or pre-ulcerative changes or infection. Orthopedic Exam: Muscle tone and strength are WNL. No limitations in general ROM. No crepitus or effusions noted.  Skin: No Porokeratosis. No infection or ulcers    Assessment & Plan:  No diagnosis found.  Patient was evaluated and treated and all questions answered.  Onychomycosis with pain  -Nails palliatively debrided as below. -Educated on self-care  Procedure: Nail Debridement Rationale: pain  Type of Debridement: manual, sharp debridement. Instrumentation: Nail nipper, rotary burr. Number of Nails: 5  Procedures and Treatment: Consent by patient was obtained for treatment procedures. The patient understood the discussion of treatment and procedures well. All questions were answered thoroughly reviewed. Debridement of mycotic and hypertrophic toenails, 1 through 5 bilateral and clearing of subungual debris. No ulceration, no infection noted.  Return Visit-Office Procedure: Patient instructed to return  to the office for a follow up visit 3 months for continued evaluation and treatment.  Boneta Lucks, DPM    No follow-ups on file.

## 2022-09-04 ENCOUNTER — Encounter: Payer: Self-pay | Admitting: Nurse Practitioner

## 2022-09-04 ENCOUNTER — Other Ambulatory Visit: Payer: 59

## 2022-09-04 ENCOUNTER — Ambulatory Visit (INDEPENDENT_AMBULATORY_CARE_PROVIDER_SITE_OTHER): Payer: 59 | Admitting: Nurse Practitioner

## 2022-09-04 ENCOUNTER — Ambulatory Visit (INDEPENDENT_AMBULATORY_CARE_PROVIDER_SITE_OTHER): Payer: 59

## 2022-09-04 VITALS — BP 110/60 | HR 89 | Temp 97.8°F | Ht 69.0 in | Wt 220.0 lb

## 2022-09-04 DIAGNOSIS — J9611 Chronic respiratory failure with hypoxia: Secondary | ICD-10-CM

## 2022-09-04 DIAGNOSIS — R0902 Hypoxemia: Secondary | ICD-10-CM | POA: Diagnosis not present

## 2022-09-04 DIAGNOSIS — J441 Chronic obstructive pulmonary disease with (acute) exacerbation: Secondary | ICD-10-CM | POA: Diagnosis not present

## 2022-09-04 DIAGNOSIS — F329 Major depressive disorder, single episode, unspecified: Secondary | ICD-10-CM

## 2022-09-04 DIAGNOSIS — I5032 Chronic diastolic (congestive) heart failure: Secondary | ICD-10-CM | POA: Diagnosis not present

## 2022-09-04 DIAGNOSIS — Z515 Encounter for palliative care: Secondary | ICD-10-CM

## 2022-09-04 DIAGNOSIS — J189 Pneumonia, unspecified organism: Secondary | ICD-10-CM

## 2022-09-04 DIAGNOSIS — R059 Cough, unspecified: Secondary | ICD-10-CM | POA: Diagnosis not present

## 2022-09-04 MED ORDER — PREDNISONE 10 MG PO TABS: ORAL_TABLET | ORAL | 0 refills | Status: DC

## 2022-09-04 MED ORDER — ALBUTEROL SULFATE (2.5 MG/3ML) 0.083% IN NEBU: 2.5000 mg | INHALATION_SOLUTION | Freq: Four times a day (QID) | RESPIRATORY_TRACT | 5 refills | Status: DC | PRN

## 2022-09-04 MED ORDER — DOXYCYCLINE HYCLATE 100 MG PO TABS: 100.0000 mg | ORAL_TABLET | Freq: Two times a day (BID) | ORAL | 0 refills | Status: AC

## 2022-09-04 NOTE — Progress Notes (Signed)
$@PatientJ$  ID: [name], male    DOB: 10-Mar-1956, 67 y.o.   MRN: [id number]  Chief Complaint  Patient presents with   Follow-up    F/u on pna, pt staes he can't breathe, wheezing and while laying all he does is cough . Lungs hurt and fatigue     Referring provider: [name], Girtha Rm, FNP  HPI: 67 year old male, active smoker followed for COPD and chronic respiratory failure on supplemental O2.  He is a patient Dr. Golden Pop and last seen in office 12/23/2021.  He was previously diagnosed with interstitial lung disease.  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  This was reinterpreted in February 2020 at [facility] smoking-related interstitial fibrosis associated with emphysema.  He had repeat CT in March 2022 without ILD. Past medical history significant for hypertension, CAD, carotid artery disease, diastolic CHF, GERD, DM 2, CKD stage III, history of substance abuse, anxiety, HLD.  TEST/EVENTS:  09/04/2019 PFT: FVC 74, FEV1 77, ratio 77, DLCOunc 52 05/24/2022 CT chest w con: mild cardiomegaly. Atherosclerosis. Dilated pulmonary artery. No LAD. Small hiatal hernia. Subcm peripherally calcified right thyroid nodule. Moderate emphysema. Central bronchial thickening. Chronic left lung volume loss. Areas of chronic parenchymal calcification. Areas of rounded atelectasis. No acute process. LLL nodule 5 mm, previously 3 mm. F/u CT 6 months recommended.  06/26/2022 CXR: newly seen patchy infiltrated in RLL, consistent with pna  12/23/2021: OV with Dr. Chase Caller for follow-up.  In April 2023 he was hospitalized for encephalopathy.  Urine tox was positive for benzos and opioids which she takes on a prescription basis.  Has trouble with wheezing all the time.  Continues on 6 L supplemental O2.  Frustrated by quality of life.  Currently COPD flare.  Could also be that he just has baseline wheezing.  Plan to treat with steroid  inhaler.  Strongly encouraged him to stop smoking.  He was continued on [place]. Overall felt like his shortness of breath was multifactorial including emphysema, weight, physical deconditioning. Latest CT without evidence of ILD.  06/26/2022: OV with Dustine Stickler NP for follow-up.  He was seen in the ED on 10/23 and treated for left lower lobe pneumonia with doxycycline course.  He was discharged home.  He then went back to the ED on 11/5 after a syncopal episode.  CT scan showed resolved infiltrates and no evidence of PE.  EKG with sinus rhythm without any significant change.  Troponins were negative.  CT of the head was unremarkable aside from chronic severe pansinusitis.  Felt as though his syncope was related to low oxygen levels and dehydration. Today, he tells me that he is still struggling with dizziness and for the last few weeks he has had an increased, productive cough. Thick, brown sputum. He also feels a little more short of breath and has noticed himself wheezing more. He denies any further syncopal events. He has not had any fevers, chills, night sweats, hemoptysis. He is eating and drinking well. He is using Breztri twice daily. No increased oxygen needs - maintain on 5-6 lpm. His POC doesn't seem to be working right. He's tried to contact his DME company to have someone look at it but he can't seem to get anyone to help him.  CXR with RLL patchy infiltrate. Treated with augmentin course and sputum cultures ordered. Pred taper for AECOPD.  09/04/2022: Today - acute Patient presents today for acute visit. After our last visit, he was  supposed to follow up in 7-10 days but we did not see him back. He tells me that he did get better but over the past two weeks, he's had increased shortness of breath, productive cough with brown sputum, and a lot of wheezing. His chest feels a lot more congested and he has trouble getting a deep breath in. He denies any fevers, chills, night sweats, hemoptysis, leg  swelling.  He is very frustrated with how he has felt over the past few months.  He is using his nebulizer treatments a few times a day.  He uses his Breztri twice daily.  Not currently taking Mucinex.  He is still wearing 5 to 6 L of oxygen, which is his baseline.  At her last visit, we did walk him on his POC which did not seem to be working correctly.  Advised him to use continuous tanks.  He is continue to use his POC and has yet to have it evaluated by his DME company.  He did have some confusion today regarding the timeline.  He thought that the last time he had pneumonia was in January.  I do not see anywhere in his chart where he was treated for pneumonia in January.  He was in the emergency room in October for pneumonia.  He was then treated again in December for pneumonia.  Regardless, he feels like he has just declined over the last year.  He was previously referred to hospice but then transition to palliative care.  He has not seen them in quite some time.  They do have plans to come back out to see him next week.  He does feel depressed related to his symptoms.  He denies any SI/HI.  Allergies  Allergen Reactions   Lisinopril Other (See Comments)    Other reaction(s): worsening kidney function   Pollen Extract     Other reaction(s): sneezing   Oxycodone Itching and Nausea Only    Pt is able to tolerate if taken with benadryl    Immunization History  Administered Date(s) Administered   Fluad Quad(high Dose 65+) 05/03/2019   Influenza Split 04/04/2014, 03/27/2015   Influenza, High Dose Seasonal PF 05/03/2019, 07/29/2020, 07/29/2021   Influenza,inj,quad, With Preservative 04/02/2014, 03/19/2015   Moderna Sars-Covid-2 Vaccination 03/07/2020, 04/04/2020   Pneumococcal Polysaccharide-23 11/04/2012, 02/04/2021   Zoster, Live 02/05/2020, 05/09/2020    Past Medical History:  Diagnosis Date   Anxiety    Arthritis    Cholecystitis, acute 12/20/2013   Lap chole 6/5   Cocaine abuse ([place])     Community acquired pneumonia 05/31/2018   COPD (chronic obstructive pulmonary disease) ([place])    Coronary atherosclerosis of native coronary artery    a. 03/09/2013 Cath/PCI: LM nl, LAD: 50p, 107m(2.5x16 promus DES), LCX nl, OM1 min irregs, LPL/LPDA diff dzs, RCA nondom, mod diff dzs, EF 55%.   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 ([place] 04/24/2021   History of nephrolithiasis    History of pneumonia    History of stroke    Right MCA distribution, residual left-sided weakness   Hyperlipidemia    Metabolic encephalopathy    Noncompliance    Osteomyelitis of second toe of left foot ([place] 04/24/2021   Renal cell carcinoma ([place]    Status post radical right nephrectomy August 2015   Respiratory failure with  hypoxia ([place])    Sepsis ([place]) 05/19/2019   Sleep apnea    On CPAP, 4L O2 no cpap at home yet   Type 2 diabetes mellitus ([place]) 2011    Tobacco History: Social History   Tobacco Use  Smoking Status Every Day   Packs/day: 1.00   Years: 41.00   Total pack years: 41.00   Types: Cigarettes   Start date: 05/03/1972   Passive exposure: Never  Smokeless Tobacco Never  Tobacco Comments   1/2 ppd 06/05/21   Ready to quit: Not Answered Counseling given: Not Answered Tobacco comments: 1/2 ppd 06/05/21   Outpatient Medications Prior to Visit  Medication Sig Dispense Refill   acetaminophen (TYLENOL) 325 MG tablet Take 650 mg by mouth See admin instructions. Take 650 mg every 3-4 hours as needed for pain.     ALPRAZolam (XANAX) 1 MG tablet Take 1 mg by mouth 3 (three) times daily as needed.     atorvastatin (LIPITOR) 20 MG tablet Take 20 mg by mouth at bedtime.  1   BREZTRI AEROSPHERE 160-9-4.8 MCG/ACT AERO Inhale 2 puffs into the lungs in the morning and at bedtime. 10.7 g 5   budesonide (PULMICORT) 1 MG/2ML nebulizer solution Take 1 mg by  nebulization in the morning, at noon, and at bedtime.     clopidogrel (PLAVIX) 75 MG tablet TAKE ONE TABLET BY MOUTH EVERY DAY 90 tablet 1   diltiazem (CARDIZEM CD) 180 MG 24 hr capsule TAKE ONE CAPSULE BY MOUTH EVERY DAY 90 capsule 2   diphenhydrAMINE (BENADRYL) 25 mg capsule Take 50 mg by mouth at bedtime.     esomeprazole (NEXIUM) 20 MG capsule Take 20 mg by mouth daily at 12 noon.     FLUoxetine (PROZAC) 20 MG capsule Take 20 mg by mouth daily.     FLUoxetine (PROZAC) 40 MG capsule Take 40 mg by mouth daily.     gabapentin (NEURONTIN) 300 MG capsule Take 900 mg by mouth 3 (three) times daily.     glucose blood (TRUE METRIX BLOOD GLUCOSE TEST) test strip      insulin regular human CONCENTRATED (HUMULIN R) 500 UNIT/ML injection Inject 90 Units into the skin 3 (three) times daily with meals.     isosorbide mononitrate (IMDUR) 60 MG 24 hr tablet Take 1 tablet (60 mg total) by mouth in the morning. & 30 mg in the evening 135 tablet 3   linagliptin (TRADJENTA) 5 MG TABS tablet Take 5 mg by mouth daily.     midodrine (PROAMATINE) 2.5 MG tablet Take 1 tablet (2.5 mg total) by mouth 2 (two) times daily. 60 tablet 6   nitroGLYCERIN (NITROSTAT) 0.4 MG SL tablet Place 1 tablet (0.4 mg total) under the tongue every 5 (five) minutes x 3 doses as needed for chest pain (if no relief after 2nd dose, proceed to ED or call 911). 25 tablet 3   oxyCODONE-acetaminophen (PERCOCET) 10-325 MG tablet Take 1 tablet by mouth daily as needed for pain.     potassium chloride SA (K-DUR,KLOR-CON) 20 MEQ tablet Take 20 mEq by mouth 2 (two) times daily.      pregabalin (LYRICA) 25 MG capsule Take 25 mg by mouth 2 (two) times daily.     Probiotic Product (PROBIOTIC PO) Take 1 capsule by mouth daily.     ranolazine (RANEXA) 500 MG 12 hr tablet Take 1 tablet (500 mg total) by mouth 2 (two) times daily. 60 tablet 6   tamsulosin (FLOMAX) 0.4 MG CAPS capsule Take  0.4 mg by mouth daily.      torsemide (DEMADEX) 20 MG tablet TAKE  THREE TABLETS BY MOUTH TWICE DAILY (Patient taking differently: Take 60 mg by mouth 2 (two) times daily.) 540 tablet 0   zolpidem (AMBIEN) 5 MG tablet Take 5 mg by mouth at bedtime. For insomnia.     formoterol (PERFOROMIST) 20 MCG/2ML nebulizer solution Take 20 mcg by nebulization as needed (shortness of breath).     No facility-administered medications prior to visit.     Review of Systems:   Constitutional: No weight loss or gain, night sweats, fevers, chills +fatigue, lassitude. HEENT: No headaches, difficulty swallowing, tooth/dental problems, or sore throat. No sneezing, itching, ear ache, nasal congestion, or post nasal drip CV:  No chest pain, orthopnea, PND, swelling in lower extremities, anasarca, palpitations, dizziness, syncope Resp: +shortness of breath with exertion; productive cough; wheezing.  No hemoptysis. No chest wall deformity GI:  No heartburn, indigestion, abdominal pain, nausea, vomiting, diarrhea, change in bowel habits, loss of appetite, bloody stools.  GU: No dysuria, change in color of urine, urgency or frequency.  No flank pain, no hematuria  Skin: No rash, lesions, ulcerations MSK:  No increased joint pain or swelling.   Neuro: No gait abnormalities. +mild confusion surrounding timeline of events Psych: No anxiety. +depression. No SI/HI. Mood stable.     Physical Exam:  BP 110/60   Pulse 89   Temp 97.8 F (36.6 C) (Oral)   Ht 5' 9"$  (1.753 m)   Wt 220 lb (99.8 kg)   SpO2 92%   BMI 32.49 kg/m   GEN: Pleasant, interactive, chronically-ill appearing; obese; in no acute distress. HEENT:  Normocephalic and atraumatic. PERRLA. Sclera white. Nasal turbinates pink, moist and patent bilaterally. No rhinorrhea present. Oropharynx pink and moist, without exudate or edema. No lesions, ulcerations, or postnasal drip.  NECK:  Supple w/ fair ROM. No JVD present. Normal carotid impulses w/o bruits. Thyroid symmetrical with no goiter or nodules palpated. No  lymphadenopathy.   CV: RRR, no m/r/g, no peripheral edema. Pulses intact, +2 bilaterally. No cyanosis, pallor or clubbing. PULMONARY:  Unlabored, regular breathing. Scattered wheezes and rhonchi bilaterally A&P. Congested cough. No accessory muscle use.  GI: BS present and normoactive. Soft, non-tender to palpation. No organomegaly or masses detected.  MSK: No erythema, warmth or tenderness. Cap refil <2 sec all extrem. No deformities or joint swelling noted.  Neuro: A/Ox3. No focal deficits noted.   Skin: Warm, no lesions or rashe Psych: Normal affect and behavior. Judgement and thought content appropriate.     Lab Results:  CBC    Component Value Date/Time   WBC 7.8 05/24/2022 1842   RBC 5.31 05/24/2022 1842   HGB 14.3 05/24/2022 1842   HCT 43.8 05/24/2022 1842   PLT 221 05/24/2022 1842   MCV 82.5 05/24/2022 1842   MCH 26.9 05/24/2022 1842   MCHC 32.6 05/24/2022 1842   RDW 13.8 05/24/2022 1842   LYMPHSABS 1.5 05/24/2022 1842   MONOABS 0.5 05/24/2022 1842   EOSABS 0.1 05/24/2022 1842   BASOSABS 0.0 05/24/2022 1842    BMET    Component Value Date/Time   NA 137 05/24/2022 1842   K 3.9 05/24/2022 1842   CL 108 05/24/2022 1842   CO2 22 05/24/2022 1842   GLUCOSE 180 (H) 05/24/2022 1842   BUN 21 05/24/2022 1842   CREATININE 1.28 (H) 05/24/2022 1842   CREATININE 1.89 (H) 04/24/2021 1524   CALCIUM 8.8 (L) 05/24/2022 1842   GFRNONAA >60  05/24/2022 1842   GFRAA 42 (L) 04/12/2020 1348    BNP    Component Value Date/Time   BNP 84.2 04/12/2020 1348     Imaging:  DG Chest 2 View  Result Date: 09/04/2022 CLINICAL DATA:  Productive cough, hypoxia EXAM: CHEST - 2 VIEW COMPARISON:  06/26/2022 FINDINGS: Frontal and lateral views of the chest demonstrate a stable cardiac silhouette. Chronic left pleural thickening and left basilar pleural and parenchymal scarring. No acute airspace disease, effusion, or pneumothorax. Central vascular congestion. No acute bony abnormality.  IMPRESSION: 1. Central vascular congestion without overt edema. 2. No acute airspace disease. 3. Stable left-sided pleural and parenchymal scarring. Electronically Signed   By: Randa Ngo M.D.   On: 09/04/2022 15:50         Latest Ref Rng & Units 09/04/2019   11:01 AM 09/08/2018   10:21 AM 09/07/2014   11:39 AM 04/13/2014   11:43 AM  PFT Results  FVC-Pre L 3.47  3.80  4.03  4.37   FVC-Predicted Pre % 74  81  83  90   FVC-Post L   4.29  4.43   FVC-Predicted Post %   89  92   Pre FEV1/FVC % % 77  76  81  82   Post FEV1/FCV % %   83  83   FEV1-Pre L 2.68  2.88  3.28  3.58   FEV1-Predicted Pre % 77  82  90  97   FEV1-Post L   3.58  3.67   DLCO uncorrected ml/min/mmHg 14.15  13.84  16.41  17.92   DLCO UNC% % 52  51  50  55   DLVA Predicted % 71  62  57  63   TLC L   6.36  6.11   TLC % Predicted %   90  87   RV % Predicted %   85  76     No results found for: "NITRICOXIDE"      Assessment & Plan:   COPD with acute exacerbation ([name]) AECOPD with increased productive cough and dyspnea.  Treated with Depo 80 mg injection x 1 and albuterol neb in office.  Will start him on empiric doxycycline 7-day course and prednisone taper.  Encouraged him to maximize bronchodilator therapy and mucociliary clearance therapies.  He was nontoxic-appearing and vital signs were stable in office today.  CXR did not show any acute process.  He was able to maintain saturations on his baseline of 5 to 6 L/min; therefore, determined that he would be appropriate to treat outpatient for the time being. He was provided with strict ED precautions.  Close follow-up. He has severe disease based on symptom burden.  He also has associated heart failure and chronic respiratory failure with significant oxygen requirements at his baseline.  He has been evaluated by hospice in the past and was transition to palliative care.  He was lost to follow-up with them.  They will begin seeing him again soon.  Understandably, he is  feeling depressed.  No SI or HI.  Encouraged him to discuss this further with palliative care or his PCP so they can assist him with managing his feelings surrounding progression of disease.  Patient Instructions  Continue Breztri 2 puffs Twice daily. Brush tongue and rinse mouth afterwards Continue Albuterol inhaler 2 puffs or 3 mL neb every 6 hours as needed for shortness of breath or wheezing. Notify if symptoms persist despite rescue inhaler/neb use. Use your neb treatments four times a  day until symptoms improve  Continue nexium 1 tab daily Continue supplemental oxygen 5-6 lpm for goal >88-90%. You need to be using your continuous tanks when you are out of the house instead of your POC unit.   Prednisone taper. 4 tabs for 3 days, then 3 tabs for 3 days, 2 tabs for 3 days, then 1 tab for 3 days, then stop. Take in AM with food. Start tomorrow.  Doxycycline 1 tab Twice daily for 7 days.  Take with food. Wear sunscreen when outside as this increases your risk for sunburns  Guaifenesin 1 tab Twice daily for chest congestion   Follow up with palliative care   Follow up in 7-10 days with Dr. Chase Caller or Alanson Aly. If symptoms do not improve or worsen, please contact office for sooner follow up or seek emergency care.    Chronic respiratory failure with hypoxia ([name]) Stable without increased O2 requirement.  Again encouraged him to have his POC looked at by his DME company.  He will continue 5 to 6 L/min for goal greater than 88-90%.  Advised him to use his portable tanks inside of his POC until he can get the machine fixed.  Chronic diastolic heart failure ([name]) Euvolemic on exam today.  CXR did have some mild vascular congestion without overt pulmonary edema.  He admits to adherence with torsemide.  Will continue to monitor him moving forward.  If no improvement with above recommendations, will obtain lab workup and may try increased dose of diuretics for short duration.  Follow-up with  cardiology as scheduled.  Depression Depression surrounding disease prognosis and progression.  See above recommendations.  No SI/HI.    I spent 42 minutes of dedicated to the care of this patient on the date of this encounter to include pre-visit review of records, face-to-face time with the patient discussing conditions above, post visit ordering of testing, clinical documentation with the electronic health record, making appropriate referrals as documented, and communicating necessary findings to members of the patients care team.  Clayton Bibles, NP 09/04/2022  Pt aware and understands NP's role.

## 2022-09-04 NOTE — Assessment & Plan Note (Signed)
AECOPD with increased productive cough and dyspnea.  Treated with Depo 80 mg injection x 1 and albuterol neb in office.  Will start him on empiric doxycycline 7-day course and prednisone taper.  Encouraged him to maximize bronchodilator therapy and mucociliary clearance therapies.  He was nontoxic-appearing and vital signs were stable in office today.  CXR did not show any acute process.  He was able to maintain saturations on his baseline of 5 to 6 L/min; therefore, determined that he would be appropriate to treat outpatient for the time being. He was provided with strict ED precautions.  Close follow-up. He has severe disease based on symptom burden.  He also has associated heart failure and chronic respiratory failure with significant oxygen requirements at his baseline.  He has been evaluated by hospice in the past and was transition to palliative care.  He was lost to follow-up with them.  They will begin seeing him again soon.  Understandably, he is feeling depressed.  No SI or HI.  Encouraged him to discuss this further with palliative care or his PCP so they can assist him with managing his feelings surrounding progression of disease.  Patient Instructions  Continue Breztri 2 puffs Twice daily. Brush tongue and rinse mouth afterwards Continue Albuterol inhaler 2 puffs or 3 mL neb every 6 hours as needed for shortness of breath or wheezing. Notify if symptoms persist despite rescue inhaler/neb use. Use your neb treatments four times a day until symptoms improve  Continue nexium 1 tab daily Continue supplemental oxygen 5-6 lpm for goal >88-90%. You need to be using your continuous tanks when you are out of the house instead of your POC unit.   Prednisone taper. 4 tabs for 3 days, then 3 tabs for 3 days, 2 tabs for 3 days, then 1 tab for 3 days, then stop. Take in AM with food. Start tomorrow.  Doxycycline 1 tab Twice daily for 7 days.  Take with food. Wear sunscreen when outside as this increases  your risk for sunburns  Guaifenesin 1 tab Twice daily for chest congestion   Follow up with palliative care   Follow up in 7-10 days with Dr. Chase Caller or Alanson Aly. If symptoms do not improve or worsen, please contact office for sooner follow up or seek emergency care.

## 2022-09-04 NOTE — Assessment & Plan Note (Addendum)
Stable without increased O2 requirement.  Again encouraged him to have his POC looked at by his DME company.  He will continue 5 to 6 L/min for goal greater than 88-90%.  Advised him to use his portable tanks inside of his POC until he can get the machine fixed.

## 2022-09-04 NOTE — Patient Instructions (Addendum)
Continue Breztri 2 puffs Twice daily. Brush tongue and rinse mouth afterwards Continue Albuterol inhaler 2 puffs or 3 mL neb every 6 hours as needed for shortness of breath or wheezing. Notify if symptoms persist despite rescue inhaler/neb use. Use your neb treatments four times a day until symptoms improve  Continue nexium 1 tab daily Continue supplemental oxygen 5-6 lpm for goal >88-90%. You need to be using your continuous tanks when you are out of the house instead of your POC unit.   Prednisone taper. 4 tabs for 3 days, then 3 tabs for 3 days, 2 tabs for 3 days, then 1 tab for 3 days, then stop. Take in AM with food. Start tomorrow.  Doxycycline 1 tab Twice daily for 7 days.  Take with food. Wear sunscreen when outside as this increases your risk for sunburns  Guaifenesin 1 tab Twice daily for chest congestion   Follow up with palliative care   Follow up in 7-10 days with Dr. Chase Caller or Alanson Aly. If symptoms do not improve or worsen, please contact office for sooner follow up or seek emergency care.

## 2022-09-04 NOTE — Progress Notes (Signed)
COMMUNITY PALLIATIVE CARE SW NOTE  PATIENT NAME: [name] DOB: 08-12-1955 MRN: [id number]  PRIMARY CARE PROVIDER: Sheran Spine, FNP  RESPONSIBLE PARTY:  Acct ID - Guarantor Home Phone Work Phone Relationship Acct Type  [id number] EMBERT, [id number] [phone]  Self P/F     [place], [place], [place] [id number]   Initial Social Work Telephonic Encounter  PC SW completed a telephonic visit with patient to assess his needs and comfort. Patient report that he was having a hard time with lungs and breathing. Patient report that he is having a hard time breathing even with o2. He stated that he is scheduled to see his lung doctor this afternoon. He expressed his desire to continue with palliative care team as he has been wanting follow-up since his last visit with the palliative care provider.  SW scheduled a visit  on 09/09/22 @ 2 pm.   Social History   Tobacco Use   Smoking status: Every Day    Packs/day: 1.00    Years: 41.00    Total pack years: 41.00    Types: Cigarettes    Start date: 05/03/1972    Passive exposure: Never   Smokeless tobacco: Never   Tobacco comments:    1/2 ppd 06/05/21  Substance Use Topics   Alcohol use: No    Alcohol/week: 0.0 standard drinks of alcohol    CODE STATUS: DNR ADVANCED DIRECTIVES:Yes MOST FORM COMPLETE:  Yes HOSPICE EDUCATION PROVIDED: No  Duration of encounter and documentation: 30 minutes  [employer], LCSW

## 2022-09-04 NOTE — Assessment & Plan Note (Signed)
Depression surrounding disease prognosis and progression.  See above recommendations.  No SI/HI.

## 2022-09-04 NOTE — Assessment & Plan Note (Addendum)
Euvolemic on exam today.  CXR did have some mild vascular congestion without overt pulmonary edema.  He admits to adherence with torsemide.  Will continue to monitor him moving forward.  If no improvement with above recommendations, will obtain lab workup and may try increased dose of diuretics for short duration.  Follow-up with cardiology as scheduled.

## 2022-09-09 ENCOUNTER — Other Ambulatory Visit: Payer: 59

## 2022-09-09 VITALS — BP 104/52 | HR 89 | Resp 22

## 2022-09-09 DIAGNOSIS — Z515 Encounter for palliative care: Secondary | ICD-10-CM

## 2022-09-09 NOTE — Progress Notes (Signed)
[place] Follow Up Encounter Note  PATIENT NAME: [name] DOB: 1955-11-27 MRN: [id number]  PRIMARY CARE PROVIDER: Sheran Spine, FNP  RESPONSIBLE PARTY:  Acct ID - Guarantor Home Phone Work Phone Relationship Acct Type  [id number] Antoine [phone]  Self P/F     [place], [place],  [id number]   RN completed home visit. Home care aide also present .   HISTORY OF PRESENT ILLNESS:  HISTORY OF PRESENT ILLNESS: 67 y.o. year old male with chronic respiratory failure  due to COPD, ILD and pulmonary fibrosis. He has poorly controlled type 2 IDDM, HTN, CAD, carotid artery stenosis, chronic diastolic heart failure, OSA, severe PVD with peripheral neuropathy, a hx of right renal carcinoma s/p nephrectomy and diabetic foot ulcer with amputation of distal portion of left foot, CKD and BPH.  He also has hx of polysubstance abuse with tobacco and cocaine.   Cognitive: Arrived to find pt lying on left side in bed with eyes closed and respirations even but rapid at 22. Pt able to communicate with RN but rarely opening eyes. States, " I just feel so bad and weak. Not a good day."  Respiratory: Active smoker- Pt noted to have resp rate in low 20s while lying in bed with eyes closed. O2 in use at 6lpm via n/c with sats 90%. Coarse breath sounds noted with expiratory wheezes in lower lobes bilaterally. Non productive but "wet" sounding cough noted. Reports that sputum is brown when something does come up.    Appetite: Pt reports that medication he takes helps him have an appetite. Caregiver reports that he eats 2 meals per day usually. Notes that pt's appetite has increased.   Pain: Pt reports having constant "pain in my lungs." Reports that nothing helps it and it is worse when coughing. Rates pain as 6 on 1-10 scale continuously.   Palliative Care/ Hospice: RN noted that pt was referred for hospice July 2023 but deferred. RN spoke discussed benefits of hospice care  as well as the differences between hospice and palliative with patient. Pt voiced desire to be evaluated for hospice.   RN to reach out to pulmonologist for hospice referral.    CODE STATUS: DNR/ DNI ADVANCED DIRECTIVES: N MOST FORM: Yes    PHYSICAL EXAM:   VITALS: Today's Vitals   09/09/22 1539  BP: (!) 104/52  Pulse: 89  Resp: (!) 22  SpO2: 90%  PainSc: 6   PainLoc: Chest    LUNGS: coarse breath sounds with exp wheezes bilaterally. Cough present. Pt reports having pneumonia. CARDIAC:  HR regular, no JVD EXTREMITIES: MAE x 4, no edema noted     Jacqulyn Cane, RN

## 2022-09-11 ENCOUNTER — Telehealth: Payer: Self-pay | Admitting: Nurse Practitioner

## 2022-09-11 ENCOUNTER — Telehealth: Payer: Self-pay

## 2022-09-11 NOTE — Telephone Encounter (Signed)
1033 Palliative Care Note  No response received from my chart messaging to pulmonologist office. RN called office and left request for return call to obtain order for hospice referral. Awaiting return call.  Jacqulyn Cane, RN

## 2022-09-11 NOTE — Telephone Encounter (Signed)
Looked at patient's chart, I do not see where our office ordered hospice or palliative care before.   Katie, are you ok with [place] placing a referral for Hospice or would you prefer this to come from his PCP? Thanks!

## 2022-09-11 NOTE — Telephone Encounter (Signed)
I'm fine with hospice referral but his PCP or one of the hospice providers should be listed as his attending for this. Thanks.

## 2022-09-14 ENCOUNTER — Telehealth: Payer: Self-pay | Admitting: Nurse Practitioner

## 2022-09-14 NOTE — Telephone Encounter (Signed)
[facility].   [place] and spoke with Tonia Ghent advised her that that [name] was ok with the hospice referral but she was advising the PCP to place order.   She states that that she will send message to the doctor and figure out what is needing to be done. Nothing further needed

## 2022-09-15 ENCOUNTER — Other Ambulatory Visit: Payer: 59

## 2022-09-15 NOTE — Telephone Encounter (Signed)
Called Julianna back at [facility] [facility] and she states that she called patient back yesterday and he is refusing any type of care at home. He does not want hospice or anything of that nature. I told [name] I would just send Joellen Jersey an update with his care just to make sure she is aware.   Thank you   Joellen Jersey just [place]

## 2022-09-16 ENCOUNTER — Encounter: Payer: Self-pay | Admitting: Nurse Practitioner

## 2022-09-16 ENCOUNTER — Ambulatory Visit (INDEPENDENT_AMBULATORY_CARE_PROVIDER_SITE_OTHER): Payer: 59 | Admitting: Nurse Practitioner

## 2022-09-16 VITALS — BP 132/84 | HR 82 | Ht 69.0 in | Wt 215.0 lb

## 2022-09-16 DIAGNOSIS — I5032 Chronic diastolic (congestive) heart failure: Secondary | ICD-10-CM

## 2022-09-16 DIAGNOSIS — J9611 Chronic respiratory failure with hypoxia: Secondary | ICD-10-CM | POA: Diagnosis not present

## 2022-09-16 DIAGNOSIS — J441 Chronic obstructive pulmonary disease with (acute) exacerbation: Secondary | ICD-10-CM | POA: Diagnosis not present

## 2022-09-16 MED ORDER — BENZONATATE 200 MG PO CAPS: 200.0000 mg | ORAL_CAPSULE | Freq: Three times a day (TID) | ORAL | 1 refills | Status: DC | PRN

## 2022-09-16 MED ORDER — PREDNISONE 10 MG PO TABS: ORAL_TABLET | ORAL | 0 refills | Status: DC

## 2022-09-16 MED ORDER — ALBUTEROL SULFATE (2.5 MG/3ML) 0.083% IN NEBU
2.5000 mg | INHALATION_SOLUTION | Freq: Once | RESPIRATORY_TRACT | Status: AC
  Administered 2022-09-16: 2.5 mg via RESPIRATORY_TRACT

## 2022-09-16 MED ORDER — FORMOTEROL FUMARATE 20 MCG/2ML IN NEBU: 20.0000 ug | INHALATION_SOLUTION | Freq: Two times a day (BID) | RESPIRATORY_TRACT | 3 refills | Status: DC

## 2022-09-16 MED ORDER — BUDESONIDE 1 MG/2ML IN SUSP: 1.0000 mg | Freq: Two times a day (BID) | RESPIRATORY_TRACT | 3 refills | Status: DC

## 2022-09-16 MED ORDER — REVEFENACIN 175 MCG/3ML IN SOLN: 175.0000 ug | Freq: Every day | RESPIRATORY_TRACT | 3 refills | Status: DC

## 2022-09-16 MED ORDER — METHYLPREDNISOLONE ACETATE 80 MG/ML IJ SUSP
80.0000 mg | Freq: Once | INTRAMUSCULAR | Status: AC
  Administered 2022-09-16: 80 mg via INTRAMUSCULAR

## 2022-09-16 NOTE — Progress Notes (Unsigned)
$'@Patient'e$  ID: [name], male    DOB: Nov 26, 1955, 67 y.o.   MRN: [id number]  Chief Complaint  Patient presents with   Follow-up    Py f/u states that he is feeling no better, he has a lingering non-productive cough, chest tightness, wheezing @ night. Reports he finished the prednisone and has one more doxycycline for 2/29.   States he has no energy to do ADL's or to take care of his mother.    Referring provider: [name], Girtha Rm, FNP  HPI: 67 year old male, active smoker followed for COPD and chronic respiratory failure on supplemental O2.  He is a patient Dr. Golden Pop and last seen in office 09/04/2022 by [facility] NP.  He was previously diagnosed with interstitial lung disease.  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  This was reinterpreted in February 2020 at [facility] smoking-related interstitial fibrosis associated with emphysema.  He had repeat CT in March 2022 without ILD. Past medical history significant for hypertension, CAD, carotid artery disease, diastolic CHF, GERD, DM 2, CKD stage III, history of substance abuse, anxiety, HLD.  TEST/EVENTS:  09/04/2019 PFT: FVC 74, FEV1 77, ratio 77, DLCOunc 52 05/24/2022 CT chest w con: mild cardiomegaly. Atherosclerosis. Dilated pulmonary artery. No LAD. Small hiatal hernia. Subcm peripherally calcified right thyroid nodule. Moderate emphysema. Central bronchial thickening. Chronic left lung volume loss. Areas of chronic parenchymal calcification. Areas of rounded atelectasis. No acute process. LLL nodule 5 mm, previously 3 mm. F/u CT 6 months recommended.  06/26/2022 CXR: newly seen patchy infiltrated in RLL, consistent with pna 09/04/2022 CXR: chronic left pleural thickening and left basilar pleural and parenchymal scarring. No acute process. Central vascular congestion without overt edema  12/23/2021: OV with Dr. Chase Caller for follow-up.  In April 2023 he was  hospitalized for encephalopathy.  Urine tox was positive for benzos and opioids which she takes on a prescription basis.  Has trouble with wheezing all the time.  Continues on 6 L supplemental O2.  Frustrated by quality of life.  Currently COPD flare.  Could also be that he just has baseline wheezing.  Plan to treat with steroid inhaler.  Strongly encouraged him to stop smoking.  He was continued on [place]. Overall felt like his shortness of breath was multifactorial including emphysema, weight, physical deconditioning. Latest CT without evidence of ILD.  06/26/2022: OV with Talon Witting NP for follow-up.  He was seen in the ED on 10/23 and treated for left lower lobe pneumonia with doxycycline course.  He was discharged home.  He then went back to the ED on 11/5 after a syncopal episode.  CT scan showed resolved infiltrates and no evidence of PE.  EKG with sinus rhythm without any significant change.  Troponins were negative.  CT of the head was unremarkable aside from chronic severe pansinusitis.  Felt as though his syncope was related to low oxygen levels and dehydration. Today, he tells me that he is still struggling with dizziness and for the last few weeks he has had an increased, productive cough. Thick, brown sputum. He also feels a little more short of breath and has noticed himself wheezing more. He denies any further syncopal events. He has not had any fevers, chills, night sweats, hemoptysis. He is eating and drinking well. He is using Breztri twice daily. No increased oxygen needs - maintain on 5-6 lpm. His POC doesn't seem to be working right. He's tried to contact his DME company  to have someone look at it but he can't seem to get anyone to help him.  CXR with RLL patchy infiltrate. Treated with augmentin course and sputum cultures ordered. Pred taper for AECOPD.  09/04/2022: OV with Elvin Banker NP for acute visit. After our last visit, he was supposed to follow up in 7-10 days but we did not see him back. He  tells me that he did get better but over the past two weeks, he's had increased shortness of breath, productive cough with brown sputum, and a lot of wheezing. His chest feels a lot more congested and he has trouble getting a deep breath in. He denies any fevers, chills, night sweats, hemoptysis, leg swelling.  He is very frustrated with how he has felt over the past few months.  He is using his nebulizer treatments a few times a day.  He uses his Breztri twice daily.  Not currently taking Mucinex.  He is still wearing 5 to 6 L of oxygen, which is his baseline.  At her last visit, we did walk him on his POC which did not seem to be working correctly.  Advised him to use continuous tanks.  He is continue to use his POC and has yet to have it evaluated by his DME company.  He did have some confusion today regarding the timeline.  He thought that the last time he had pneumonia was in January.  I do not see anywhere in his chart where he was treated for pneumonia in January.  He was in the emergency room in October for pneumonia.  He was then treated again in December for pneumonia.  Regardless, he feels like he has just declined over the last year.  He was previously referred to hospice but then transition to palliative care.  He has not seen them in quite some time.  They do have plans to come back out to see him next week.  He does feel depressed related to his symptoms.  He denies any SI/HI.  09/16/2022: Today - follow up Patient presents today after being treated for AECOPD with empiric doxycycline and prednisone taper. CXR was without acute process and resolved pna. He tells me today that his cough has improved; no longer productive but still having some dry coughing spells. He feels like his breathing and wheezing are worse again after coming off the steroids. He was feeling better on this. Feels fatigued as well. He denies fevers, chills, hemoptysis, leg swelling, orthopnea. He has been using his Breztri twice  daily. Uses his nebs multiple times a day, which he feels work well for him but don't last long. No increased oxygen requirement.   Allergies  Allergen Reactions   Lisinopril Other (See Comments)    Other reaction(s): worsening kidney function   Pollen Extract     Other reaction(s): sneezing   Oxycodone Itching and Nausea Only    Pt is able to tolerate if taken with benadryl    Immunization History  Administered Date(s) Administered   Fluad Quad(high Dose 65+) 05/03/2019   Influenza Split 04/04/2014, 03/27/2015   Influenza, High Dose Seasonal PF 05/03/2019, 07/29/2020, 07/29/2021   Influenza,inj,quad, With Preservative 04/02/2014, 03/19/2015   Moderna Sars-Covid-2 Vaccination 03/07/2020, 04/04/2020   Pneumococcal Polysaccharide-23 11/04/2012, 02/04/2021   Zoster, Live 02/05/2020, 05/09/2020    Past Medical History:  Diagnosis Date   Anxiety    Arthritis    Cholecystitis, acute 12/20/2013   Lap chole 6/5   Cocaine abuse ([place])    Community  acquired pneumonia 05/31/2018   COPD (chronic obstructive pulmonary disease) ([name])    Coronary atherosclerosis of native coronary artery    a. 03/09/2013 Cath/PCI: LM nl, LAD: 50p, 87m(2.5x16 promus DES), LCX nl, OM1 min irregs, LPL/LPDA diff dzs, RCA nondom, mod diff dzs, EF 55%.   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 ([facility] 04/24/2021   History of nephrolithiasis    History of pneumonia    History of stroke    Right MCA distribution, residual left-sided weakness   Hyperlipidemia    Metabolic encephalopathy    Noncompliance    Osteomyelitis of second toe of left foot ([facility] 04/24/2021   Renal cell carcinoma ([facility]    Status post radical right nephrectomy August 2015   Respiratory failure with hypoxia ([facility]    Sepsis ([facility] 05/19/2019   Sleep apnea    On CPAP, 4L O2 no cpap at home yet   Type 2  diabetes mellitus ([facility] 2011    Tobacco History: Social History   Tobacco Use  Smoking Status Every Day   Packs/day: 1.00   Years: 41.00   Total pack years: 41.00   Types: Cigarettes   Start date: 05/03/1972   Passive exposure: Never  Smokeless Tobacco Never  Tobacco Comments   1/2 ppd 09/16/2022, [facility]   Ready to quit: Not Answered Counseling given: Not Answered Tobacco comments: 1/2 ppd 09/16/2022, [facility]   Outpatient Medications Prior to Visit  Medication Sig Dispense Refill   acetaminophen (TYLENOL) 325 MG tablet Take 650 mg by mouth See admin instructions. Take 650 mg every 3-4 hours as needed for pain.     albuterol (PROVENTIL) (2.5 MG/3ML) 0.083% nebulizer solution Take 3 mLs (2.5 mg total) by nebulization every 6 (six) hours as needed for wheezing or shortness of breath. 75 mL 5   ALPRAZolam (XANAX) 1 MG tablet Take 1 mg by mouth 3 (three) times daily as needed.     atorvastatin (LIPITOR) 20 MG tablet Take 20 mg by mouth at bedtime.  1   clopidogrel (PLAVIX) 75 MG tablet TAKE ONE TABLET BY MOUTH EVERY DAY 90 tablet 1   diltiazem (CARDIZEM CD) 180 MG 24 hr capsule TAKE ONE CAPSULE BY MOUTH EVERY DAY 90 capsule 2   diphenhydrAMINE (BENADRYL) 25 mg capsule Take 50 mg by mouth at bedtime.     esomeprazole (NEXIUM) 20 MG capsule Take 20 mg by mouth daily at 12 noon.     FLUoxetine (PROZAC) 20 MG capsule Take 20 mg by mouth daily.     FLUoxetine (PROZAC) 40 MG capsule Take 40 mg by mouth daily.     gabapentin (NEURONTIN) 300 MG capsule Take 900 mg by mouth 3 (three) times daily.     glucose blood (TRUE METRIX BLOOD GLUCOSE TEST) test strip      insulin regular human CONCENTRATED (HUMULIN R) 500 UNIT/ML injection Inject 90 Units into the skin 3 (three) times daily with meals.     isosorbide mononitrate (IMDUR) 60 MG 24 hr tablet Take 1 tablet (60 mg total) by mouth in the morning. & 30 mg in the evening 135 tablet 3   linagliptin (TRADJENTA) 5 MG TABS tablet Take 5 mg by mouth daily.      midodrine (PROAMATINE) 2.5 MG tablet Take 1 tablet (2.5 mg total) by mouth 2 (two) times daily. 60 tablet  6   nitroGLYCERIN (NITROSTAT) 0.4 MG SL tablet Place 1 tablet (0.4 mg total) under the tongue every 5 (five) minutes x 3 doses as needed for chest pain (if no relief after 2nd dose, proceed to ED or call 911). 25 tablet 3   oxyCODONE-acetaminophen (PERCOCET) 10-325 MG tablet Take 1 tablet by mouth daily as needed for pain.     potassium chloride SA (K-DUR,KLOR-CON) 20 MEQ tablet Take 20 mEq by mouth 2 (two) times daily.      pregabalin (LYRICA) 25 MG capsule Take 25 mg by mouth 2 (two) times daily.     Probiotic Product (PROBIOTIC PO) Take 1 capsule by mouth daily.     ranolazine (RANEXA) 500 MG 12 hr tablet Take 1 tablet (500 mg total) by mouth 2 (two) times daily. 60 tablet 6   tamsulosin (FLOMAX) 0.4 MG CAPS capsule Take 0.4 mg by mouth daily.      torsemide (DEMADEX) 20 MG tablet TAKE THREE TABLETS BY MOUTH TWICE DAILY (Patient taking differently: Take 60 mg by mouth 2 (two) times daily.) 540 tablet 0   zolpidem (AMBIEN) 5 MG tablet Take 5 mg by mouth at bedtime. For insomnia.     BREZTRI AEROSPHERE 160-9-4.8 MCG/ACT AERO Inhale 2 puffs into the lungs in the morning and at bedtime. 10.7 g 5   budesonide (PULMICORT) 1 MG/2ML nebulizer solution Take 1 mg by nebulization in the morning, at noon, and at bedtime.     predniSONE (DELTASONE) 10 MG tablet 4 tabs for 3 days, then 3 tabs for 3 days, 2 tabs for 3 days, then 1 tab for 3 days, then stop 30 tablet 0   No facility-administered medications prior to visit.     Review of Systems:   Constitutional: No weight loss or gain, night sweats, fevers, chills +fatigue, lassitude. HEENT: No headaches, difficulty swallowing, tooth/dental problems, or sore throat. No sneezing, itching, ear ache, nasal congestion, or post nasal drip CV:  No chest pain, orthopnea, PND, swelling in lower extremities, anasarca, palpitations, dizziness,  syncope Resp: +shortness of breath with exertion; productive cough; wheezing.  No hemoptysis. No chest wall deformity GI:  No heartburn, indigestion, abdominal pain, nausea, vomiting, diarrhea, change in bowel habits, loss of appetite, bloody stools.  GU: No dysuria, change in color of urine, urgency or frequency.  No flank pain, no hematuria  Skin: No rash, lesions, ulcerations MSK:  No increased joint pain or swelling.   Neuro: No gait abnormalities. +mild confusion surrounding timeline of events Psych: No anxiety. +depression. No SI/HI. Mood stable.     Physical Exam:  BP 132/84   Pulse 82   Ht '5\' 9"'$  (1.753 m)   Wt 215 lb (97.5 kg) Comment: pt stated  SpO2 95% Comment: 5L POC  BMI 31.75 kg/m   GEN: Pleasant, interactive, chronically-ill appearing; obese; in no acute distress. HEENT:  Normocephalic and atraumatic. PERRLA. Sclera white. Nasal turbinates pink, moist and patent bilaterally. No rhinorrhea present. Oropharynx pink and moist, without exudate or edema. No lesions, ulcerations, or postnasal drip.  NECK:  Supple w/ fair ROM. No JVD present. Normal carotid impulses w/o bruits. Thyroid symmetrical with no goiter or nodules palpated. No lymphadenopathy.   CV: RRR, no m/r/g, no peripheral edema. Pulses intact, +2 bilaterally. No cyanosis, pallor or clubbing. PULMONARY:  Unlabored, regular breathing. Scattered wheezes and rhonchi bilaterally A&P. Congested cough. No accessory muscle use.  GI: BS present and normoactive. Soft, non-tender to palpation. No organomegaly or masses detected.  MSK: No erythema,  warmth or tenderness. Cap refil <2 sec all extrem. No deformities or joint swelling noted.  Neuro: A/Ox3. No focal deficits noted.   Skin: Warm, no lesions or rashe Psych: Normal affect and behavior. Judgement and thought content appropriate.     Lab Results:  CBC    Component Value Date/Time   WBC 7.8 05/24/2022 1842   RBC 5.31 05/24/2022 1842   HGB 14.3 05/24/2022 1842    HCT 43.8 05/24/2022 1842   PLT 221 05/24/2022 1842   MCV 82.5 05/24/2022 1842   MCH 26.9 05/24/2022 1842   MCHC 32.6 05/24/2022 1842   RDW 13.8 05/24/2022 1842   LYMPHSABS 1.5 05/24/2022 1842   MONOABS 0.5 05/24/2022 1842   EOSABS 0.1 05/24/2022 1842   BASOSABS 0.0 05/24/2022 1842    BMET    Component Value Date/Time   NA 137 05/24/2022 1842   K 3.9 05/24/2022 1842   CL 108 05/24/2022 1842   CO2 22 05/24/2022 1842   GLUCOSE 180 (H) 05/24/2022 1842   BUN 21 05/24/2022 1842   CREATININE 1.28 (H) 05/24/2022 1842   CREATININE 1.89 (H) 04/24/2021 1524   CALCIUM 8.8 (L) 05/24/2022 1842   GFRNONAA >60 05/24/2022 1842   GFRAA 42 (L) 04/12/2020 1348    BNP    Component Value Date/Time   BNP 84.2 04/12/2020 1348     Imaging:  DG Chest 2 View  Result Date: 09/04/2022 CLINICAL DATA:  Productive cough, hypoxia EXAM: CHEST - 2 VIEW COMPARISON:  06/26/2022 FINDINGS: Frontal and lateral views of the chest demonstrate a stable cardiac silhouette. Chronic left pleural thickening and left basilar pleural and parenchymal scarring. No acute airspace disease, effusion, or pneumothorax. Central vascular congestion. No acute bony abnormality. IMPRESSION: 1. Central vascular congestion without overt edema. 2. No acute airspace disease. 3. Stable left-sided pleural and parenchymal scarring. Electronically Signed   By: Randa Ngo M.D.   On: 09/04/2022 15:50    albuterol (PROVENTIL) (2.5 MG/3ML) 0.083% nebulizer solution 2.5 mg     Date Action Dose Route User   09/16/2022 1543 Given 2.5 mg Nebulization Mellody Life E, CMA      methylPREDNISolone acetate (DEPO-MEDROL) injection 80 mg     Date Action Dose Route User   09/16/2022 1535 Given 80 mg Intramuscular (Left Ventrogluteal) Priscille Kluver, CMA          Latest Ref Rng & Units 09/04/2019   11:01 AM 09/08/2018   10:21 AM 09/07/2014   11:39 AM 04/13/2014   11:43 AM  PFT Results  FVC-Pre L 3.47  3.80  4.03  4.37   FVC-Predicted  Pre % 74  81  83  90   FVC-Post L   4.29  4.43   FVC-Predicted Post %   89  92   Pre FEV1/FVC % % 77  76  81  82   Post FEV1/FCV % %   83  83   FEV1-Pre L 2.68  2.88  3.28  3.58   FEV1-Predicted Pre % 77  82  90  97   FEV1-Post L   3.58  3.67   DLCO uncorrected ml/min/mmHg 14.15  13.84  16.41  17.92   DLCO UNC% % 52  51  50  55   DLVA Predicted % 71  62  57  63   TLC L   6.36  6.11   TLC % Predicted %   90  87   RV % Predicted %   85  76  No results found for: "NITRICOXIDE"      Assessment & Plan:   COPD with acute exacerbation ([place]) Unresolved AECOPD. Overall, cough has improved but empiric doxycycline course but dyspnea remains. We discussed continued treatment outpatient vs inpatient. Given his VS are stable and he has not had any increase O2 requirement, shared decision to continue treating him outpatient. Strict ED/return precautions were provided. We will restart him on extended prednisone course and cough control measures. Maximize bronchodilator therapies. Plan to change him to triple therapy neb regimen from [facility] to see if he has better benefit from this as his symptom burden is high at baseline. Action plan in place.  Patient Instructions  Continue Breztri 2 puffs Twice daily until you receive your nebulized medications. Brush tongue and rinse mouth afterwards Once received, you will stop Breztri and start.... Budesonide neb 2 mL Twice daily. Brush tongue and rinse mouth afterwards Formoterol (performist) 2 mL Twice daily. Can use with budesonide Revefenacin (Yupelri) 3 mL once daily  Continue Albuterol inhaler 2 puffs or 3 mL neb every 6 hours as needed for shortness of breath or wheezing. Notify if symptoms persist despite rescue inhaler/neb use. Use your neb treatments four times a day until symptoms improve  Continue nexium 1 tab daily Continue supplemental oxygen 5-6 lpm for goal >88-90%. You need to be using your continuous tanks when you are out of the house  instead of your POC unit.   Prednisone taper. 4 tabs for 5 days, then 3 tabs for 5 days, 2 tabs for 5 days, then stay on 1 tablet daily until you are seen back. Take in AM with food. Start tomorrow. Monitor your blood sugar. If you are staying >350, go to the emergency department Mucinex DM over the counter Twice daily for cough/congestion Benzonatate 1 capsule Three times a day for cough   Follow up in 7-10 days with Dr. Chase Caller or Alanson Aly. If symptoms do not improve or worsen, please contact office for sooner follow up or seek emergency care.    Chronic respiratory failure with hypoxia ([name]) Stable without increased O2 requirements today. Able to maintain on POC 5 lpm. Seems to be working better at this point. Advised him to monitor the machine and if it malfunctions, to use his portable tanks and seek troubleshooting from his DME. Goal [id number]  Chronic diastolic heart failure ([name]) Euvolemic on exam. Does not seem to be having acute exacerbation of his CHF. May consider BNP and repeat echo is symptoms persist despite above recommendations. He admits to compliance with torsemide. Follow up with cardiology as scheduled.      I spent 42 minutes of dedicated to the care of this patient on the date of this encounter to include pre-visit review of records, face-to-face time with the patient discussing conditions above, post visit ordering of testing, clinical documentation with the electronic health record, making appropriate referrals as documented, and communicating necessary findings to members of the patients care team.  Clayton Bibles, NP 09/16/2022  Pt aware and understands NP's role.

## 2022-09-16 NOTE — Assessment & Plan Note (Signed)
Euvolemic on exam. Does not seem to be having acute exacerbation of his CHF. May consider BNP and repeat echo is symptoms persist despite above recommendations. He admits to compliance with torsemide. Follow up with cardiology as scheduled.

## 2022-09-16 NOTE — Telephone Encounter (Signed)
I think he has been participating in Palliative care and they are the ones who made the recommendation to transition to Hospice so I would just make sure they are aware. Thanks.

## 2022-09-16 NOTE — Patient Instructions (Addendum)
Continue Breztri 2 puffs Twice daily until you receive your nebulized medications. Brush tongue and rinse mouth afterwards Once received, you will stop Breztri and start.... Budesonide neb 2 mL Twice daily. Brush tongue and rinse mouth afterwards Formoterol (performist) 2 mL Twice daily. Can use with budesonide Revefenacin (Yupelri) 3 mL once daily  Continue Albuterol inhaler 2 puffs or 3 mL neb every 6 hours as needed for shortness of breath or wheezing. Notify if symptoms persist despite rescue inhaler/neb use. Use your neb treatments four times a day until symptoms improve  Continue nexium 1 tab daily Continue supplemental oxygen 5-6 lpm for goal >88-90%. You need to be using your continuous tanks when you are out of the house instead of your POC unit.   Prednisone taper. 4 tabs for 5 days, then 3 tabs for 5 days, 2 tabs for 5 days, then stay on 1 tablet daily until you are seen back. Take in AM with food. Start tomorrow. Monitor your blood sugar. If you are staying >350, go to the emergency department Mucinex DM over the counter Twice daily for cough/congestion Benzonatate 1 capsule Three times a day for cough   Follow up in 7-10 days with Dr. Chase Caller or Alanson Aly. If symptoms do not improve or worsen, please contact office for sooner follow up or seek emergency care.

## 2022-09-16 NOTE — Telephone Encounter (Signed)
Called [facility] and made sure they are aware of the update from patient. [name] noted in patients chart and told me she would keep [place] up to date with any changes. Nothing further needed

## 2022-09-16 NOTE — Assessment & Plan Note (Addendum)
Stable without increased O2 requirements today. Able to maintain on POC 5 lpm. Seems to be working better at this point. Advised him to monitor the machine and if it malfunctions, to use his portable tanks and seek troubleshooting from his DME. Goal >88-90%

## 2022-09-16 NOTE — Assessment & Plan Note (Signed)
Unresolved AECOPD. Overall, cough has improved with empiric doxycycline course but dyspnea remains. We discussed continued treatment outpatient vs inpatient. Given his VS are stable and he has not had any increase O2 requirement, shared decision to continue treating him outpatient. Strict ED/return precautions were provided. We will restart him on extended prednisone course and cough control measures. Maximize bronchodilator therapies. Plan to change him to triple therapy neb regimen from [facility] to see if he has better benefit from this as his symptom burden is high at baseline and he has struggled with recurrent exacerbations. Action plan in place.  Patient Instructions  Continue Breztri 2 puffs Twice daily until you receive your nebulized medications. Brush tongue and rinse mouth afterwards Once received, you will stop Breztri and start.... Budesonide neb 2 mL Twice daily. Brush tongue and rinse mouth afterwards Formoterol (performist) 2 mL Twice daily. Can use with budesonide Revefenacin (Yupelri) 3 mL once daily  Continue Albuterol inhaler 2 puffs or 3 mL neb every 6 hours as needed for shortness of breath or wheezing. Notify if symptoms persist despite rescue inhaler/neb use. Use your neb treatments four times a day until symptoms improve  Continue nexium 1 tab daily Continue supplemental oxygen 5-6 lpm for goal >88-90%. You need to be using your continuous tanks when you are out of the house instead of your POC unit.   Prednisone taper. 4 tabs for 5 days, then 3 tabs for 5 days, 2 tabs for 5 days, then stay on 1 tablet daily until you are seen back. Take in AM with food. Start tomorrow. Monitor your blood sugar. If you are staying >350, go to the emergency department Mucinex DM over the counter Twice daily for cough/congestion Benzonatate 1 capsule Three times a day for cough   Follow up in 7-10 days with Dr. Chase Caller or Alanson Aly. If symptoms do not improve or worsen, please contact  office for sooner follow up or seek emergency care.

## 2022-09-17 ENCOUNTER — Encounter: Payer: Self-pay | Admitting: Nurse Practitioner

## 2022-09-21 ENCOUNTER — Other Ambulatory Visit: Payer: Self-pay | Admitting: *Deleted

## 2022-09-21 DIAGNOSIS — I739 Peripheral vascular disease, unspecified: Secondary | ICD-10-CM

## 2022-09-21 DIAGNOSIS — I6529 Occlusion and stenosis of unspecified carotid artery: Secondary | ICD-10-CM

## 2022-09-25 ENCOUNTER — Ambulatory Visit (INDEPENDENT_AMBULATORY_CARE_PROVIDER_SITE_OTHER): Payer: 59 | Admitting: Nurse Practitioner

## 2022-09-25 ENCOUNTER — Encounter: Payer: Self-pay | Admitting: Nurse Practitioner

## 2022-09-25 VITALS — BP 90/50 | HR 83 | Temp 97.8°F | Ht 69.0 in | Wt 215.8 lb

## 2022-09-25 DIAGNOSIS — I5032 Chronic diastolic (congestive) heart failure: Secondary | ICD-10-CM

## 2022-09-25 DIAGNOSIS — J449 Chronic obstructive pulmonary disease, unspecified: Secondary | ICD-10-CM | POA: Diagnosis not present

## 2022-09-25 DIAGNOSIS — J9611 Chronic respiratory failure with hypoxia: Secondary | ICD-10-CM

## 2022-09-25 DIAGNOSIS — I9589 Other hypotension: Secondary | ICD-10-CM | POA: Diagnosis not present

## 2022-09-25 DIAGNOSIS — J439 Emphysema, unspecified: Secondary | ICD-10-CM

## 2022-09-25 LAB — BASIC METABOLIC PANEL
BUN: 53 mg/dL — ABNORMAL HIGH (ref 6–23)
CO2: 28 mEq/L (ref 19–32)
Calcium: 9.3 mg/dL (ref 8.4–10.5)
Chloride: 97 mEq/L (ref 96–112)
Creatinine, Ser: 2.05 mg/dL — ABNORMAL HIGH (ref 0.40–1.50)
GFR: 32.97 mL/min — ABNORMAL LOW (ref >60.00)
Glucose, Bld: 123 mg/dL — ABNORMAL HIGH (ref 70–99)
Potassium: 4.2 mEq/L (ref 3.5–5.1)
Sodium: 136 mEq/L (ref 135–145)

## 2022-09-25 LAB — BRAIN NATRIURETIC PEPTIDE: Pro B Natriuretic peptide (BNP): 54 pg/mL (ref 0.0–100.0)

## 2022-09-25 MED ORDER — PREDNISONE 5 MG PO TABS: ORAL_TABLET | ORAL | 0 refills | Status: DC

## 2022-09-25 MED ORDER — PREDNISONE 5 MG PO TABS: 5.0000 mg | ORAL_TABLET | Freq: Every day | ORAL | 5 refills | Status: DC

## 2022-09-25 NOTE — Assessment & Plan Note (Signed)
Abdominal distention. Soft and BS nl on exam today. Weight is stable. Possible due to prednisone use vs acute CHF exacerbation. His CXR from 2/16 did show vascular congestion. He is compliant with torsemide per his report. Check BNP and BMET today. May consider increasing diuretics for short duration and obtaining echo, dependent on results. Dyspnea improved with steroids and neb therapies. Follow up with cardiology as scheduled.

## 2022-09-25 NOTE — Assessment & Plan Note (Addendum)
BP 90/50 today. He tells me that this is normal for him; usually somewhere between 90-100. Asymptomatic. Advised him to closely monitor at home. If no improvement, he will contact cardiology for medication adjustments. ED precautions advised.

## 2022-09-25 NOTE — Patient Instructions (Addendum)
Stop Breztri. You will only use your nebulized treatments Continue Budesonide neb 2 mL Twice daily. Brush tongue and rinse mouth afterwards Continue Formoterol (performist) 2 mL Twice daily. Can use with budesonide Continue Revefenacin (Yupelri) 3 mL once daily Continue Mucinex DM over the counter Twice daily for cough/congestion Continue Benzonatate 1 capsule Three times a day for cough  Continue Albuterol inhaler 2 puffs or 3 mL neb every 6 hours as needed for shortness of breath or wheezing. Notify if symptoms persist despite rescue inhaler/neb use. Use your neb treatments four times a day until symptoms improve  Continue nexium 1 tab daily Continue supplemental oxygen 5-6 lpm for goal >88-90%.    Finish prednisone taper and then start taking 5 mg daily. Take in AM with food    Labs today- I will call you if we need to adjust your fluid pills Monitor blood pressure at home for goal >100/60 and <140/90. Call your heart doctor or primary care doctor if unable to maintain a normal blood pressure  Follow up in 6 weeks with Dr. Chase Caller (1st) or Katie Rosella Crandell,NP. If symptoms do not improve or worsen, please contact office for sooner follow up or seek emergency care.

## 2022-09-25 NOTE — Assessment & Plan Note (Addendum)
Stable without increased O2 requirement. Sats improved today; up from 92% to 98% on 6 lpm. Using 5 lpm on home concentrator. POC not working properly so he has been alternating between this and his portable tanks. Goal >88-90%

## 2022-09-25 NOTE — Assessment & Plan Note (Signed)
Moderate COPD with high symptom burden and slow to resolve recent exacerbation. He looks the best I have seen him in the past few weeks. Clinically improving. He will continue current taper and remain on low dose daily steroids. Action plan in place. Encouraged him to remain active. Smoking cessation strongly advised but not entertained by patient. Will continue to have discussions moving forward. Educated on the effects continued smoking has on his lung function.  Patient Instructions  Stop Breztri. You will only use your nebulized treatments Continue Budesonide neb 2 mL Twice daily. Brush tongue and rinse mouth afterwards Continue Formoterol (performist) 2 mL Twice daily. Can use with budesonide Continue Revefenacin (Yupelri) 3 mL once daily Continue Mucinex DM over the counter Twice daily for cough/congestion Continue Benzonatate 1 capsule Three times a day for cough  Continue Albuterol inhaler 2 puffs or 3 mL neb every 6 hours as needed for shortness of breath or wheezing. Notify if symptoms persist despite rescue inhaler/neb use. Use your neb treatments four times a day until symptoms improve  Continue nexium 1 tab daily Continue supplemental oxygen 5-6 lpm for goal >88-90%.    Finish prednisone taper and then start taking 5 mg daily. Take in AM with food    Labs today- I will call you if we need to adjust your fluid pills Monitor blood pressure at home for goal >100/60 and <140/90. Call your heart doctor or primary care doctor if unable to maintain a normal blood pressure  Follow up in 6 weeks with Dr. Chase Caller (1st) or Katie Kewanna Kasprzak,NP. If symptoms do not improve or worsen, please contact office for sooner follow up or seek emergency care.

## 2022-09-25 NOTE — Progress Notes (Signed)
$'@Patient'A$  ID: [name], male    DOB: 1956/06/20, 67 y.o.   MRN: [id number]  Chief Complaint  Patient presents with   Follow-up    Feeling better with his breathing.  Prednisone is making him feel out of it and weird.  Not back to baseline.  Still has coughing/wheezing.  Nothing coming up.    Referring provider: [name], Girtha Rm, FNP  HPI: 67 year old male, active smoker followed for COPD and chronic respiratory failure on supplemental O2.  He is a patient Dr. Golden Pop and last seen in office 09/16/2022 by [name] NP.  He was previously diagnosed with interstitial lung disease.  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  This was reinterpreted in February 2020 at [facility] smoking-related interstitial fibrosis associated with emphysema.  He had repeat CT in March 2022 without ILD. Past medical history significant for hypertension, CAD, carotid artery disease, diastolic CHF, GERD, DM 2, CKD stage III, history of substance abuse, anxiety, HLD.  TEST/EVENTS:  09/04/2019 PFT: FVC 74, FEV1 77, ratio 77, DLCOunc 52 05/24/2022 CT chest w con: mild cardiomegaly. Atherosclerosis. Dilated pulmonary artery. No LAD. Small hiatal hernia. Subcm peripherally calcified right thyroid nodule. Moderate emphysema. Central bronchial thickening. Chronic left lung volume loss. Areas of chronic parenchymal calcification. Areas of rounded atelectasis. No acute process. LLL nodule 5 mm, previously 3 mm. F/u CT 6 months recommended.  06/26/2022 CXR: newly seen patchy infiltrated in RLL, consistent with pna 09/04/2022 CXR: chronic left pleural thickening and left basilar pleural and parenchymal scarring. No acute process. Central vascular congestion without overt edema  12/23/2021: OV with Dr. Chase Caller for follow-up.  In April 2023 he was hospitalized for encephalopathy.  Urine tox was positive for benzos and opioids which she takes on a prescription  basis.  Has trouble with wheezing all the time.  Continues on 6 L supplemental O2.  Frustrated by quality of life.  Currently COPD flare.  Could also be that he just has baseline wheezing.  Plan to treat with steroid inhaler.  Strongly encouraged him to stop smoking.  He was continued on [place]. Overall felt like his shortness of breath was multifactorial including emphysema, weight, physical deconditioning. Latest CT without evidence of ILD.  06/26/2022: OV with Ariyan Sinnett NP for follow-up.  He was seen in the ED on 10/23 and treated for left lower lobe pneumonia with doxycycline course.  He was discharged home.  He then went back to the ED on 11/5 after a syncopal episode.  CT scan showed resolved infiltrates and no evidence of PE.  EKG with sinus rhythm without any significant change.  Troponins were negative.  CT of the head was unremarkable aside from chronic severe pansinusitis.  Felt as though his syncope was related to low oxygen levels and dehydration. Today, he tells me that he is still struggling with dizziness and for the last few weeks he has had an increased, productive cough. Thick, brown sputum. He also feels a little more short of breath and has noticed himself wheezing more. He denies any further syncopal events. He has not had any fevers, chills, night sweats, hemoptysis. He is eating and drinking well. He is using Breztri twice daily. No increased oxygen needs - maintain on 5-6 lpm. His POC doesn't seem to be working right. He's tried to contact his DME company to have someone look at it but he can't seem to get anyone to help him.  CXR with RLL  patchy infiltrate. Treated with augmentin course and sputum cultures ordered. Pred taper for AECOPD.  09/04/2022: OV with Abeeha Twist NP for acute visit. After our last visit, he was supposed to follow up in 7-10 days but we did not see him back. He tells me that he did get better but over the past two weeks, he's had increased shortness of breath, productive  cough with brown sputum, and a lot of wheezing. His chest feels a lot more congested and he has trouble getting a deep breath in. He denies any fevers, chills, night sweats, hemoptysis, leg swelling.  He is very frustrated with how he has felt over the past few months.  He is using his nebulizer treatments a few times a day.  He uses his Breztri twice daily.  Not currently taking Mucinex.  He is still wearing 5 to 6 L of oxygen, which is his baseline.  At her last visit, we did walk him on his POC which did not seem to be working correctly.  Advised him to use continuous tanks.  He is continue to use his POC and has yet to have it evaluated by his DME company.  He did have some confusion today regarding the timeline.  He thought that the last time he had pneumonia was in January.  I do not see anywhere in his chart where he was treated for pneumonia in January.  He was in the emergency room in October for pneumonia.  He was then treated again in December for pneumonia.  Regardless, he feels like he has just declined over the last year.  He was previously referred to hospice but then transition to palliative care.  He has not seen them in quite some time.  They do have plans to come back out to see him next week.  He does feel depressed related to his symptoms.  He denies any SI/HI.  09/16/2022: OV with Vedder Brittian NP for AECOPD with empiric doxycycline and prednisone taper. CXR was without acute process and resolved pna. He tells me today that his cough has improved; no longer productive but still having some dry coughing spells. He feels like his breathing and wheezing are worse again after coming off the steroids. He was feeling better on this. Feels fatigued as well. He denies fevers, chills, hemoptysis, leg swelling, orthopnea. He has been using his Breztri twice daily. Uses his nebs multiple times a day, which he feels work well for him but don't last long. No increased oxygen requirement.   09/25/2022: Today -  follow up Patient presents today for follow up. At our last visit, we started him on nebulized triple therapy regimen and extended prednisone taper. He is taking 30 mg of prednisone and will go to 20 mg tomorrow. He is feeling much better today. Feels like his energy levels have improved and he doesn't feel as weak. He has been able to change his bed without having to sit and rest now. His cough has mostly resolved. He does feel like his stomach is a little more swollen and shirts are tighter. He is taking his torsemide but hasn't been urinating as much. Denies any chest pain, leg swelling, palpitations, orthopnea, dizziness. No increased oxygen requirement.   Allergies  Allergen Reactions   Lisinopril Other (See Comments)    Other reaction(s): worsening kidney function   Pollen Extract     Other reaction(s): sneezing   Oxycodone Itching and Nausea Only    Pt is able to tolerate if taken with  benadryl    Immunization History  Administered Date(s) Administered   Fluad Quad(high Dose 65+) 05/03/2019   Influenza Split 04/04/2014, 03/27/2015   Influenza, High Dose Seasonal PF 05/03/2019, 07/29/2020, 07/29/2021   Influenza,inj,quad, With Preservative 04/02/2014, 03/19/2015   Moderna Sars-Covid-2 Vaccination 03/07/2020, 04/04/2020   Pneumococcal Polysaccharide-23 11/04/2012, 02/04/2021   Zoster, Live 02/05/2020, 05/09/2020    Past Medical History:  Diagnosis Date   Anxiety    Arthritis    Cholecystitis, acute 12/20/2013   Lap chole 6/5   Cocaine abuse ([place])    Community acquired pneumonia 05/31/2018   COPD (chronic obstructive pulmonary disease) ([place])    Coronary atherosclerosis of native coronary artery    a. 03/09/2013 Cath/PCI: LM nl, LAD: 50p, 79m(2.5x16 promus DES), LCX nl, OM1 min irregs, LPL/LPDA diff dzs, RCA nondom, mod diff dzs, EF 55%.   Depression    Essential hypertension    GERD (gastroesophageal reflux disease)    Headache    Hepatitis Late 1970s   History of complete  ray amputation of first toe of left foot ([place] 04/24/2021   History of nephrolithiasis    History of pneumonia    History of stroke    Right MCA distribution, residual left-sided weakness   Hyperlipidemia    Metabolic encephalopathy    Noncompliance    [place]steomyelitis of second toe of left foot [place] 04/24/2021   [place] cell carcinoma [place]    Status post radical right nephrectomy August 2015   Respiratory failure with hypoxia ([facility]    Sepsis ([facility] 05/19/2019   Sleep apnea    On CPAP, 4L O2 no cpap at home yet   Type 2 diabetes mellitus ([facility] 2011    Tobacco History: Social History   Tobacco Use  Smoking Status Every Day   Packs/day: 1.00   Years: 41.00   Total pack years: 41.00   Types: Cigarettes   Start date: 05/03/1972   Passive exposure: Never  Smokeless Tobacco Never  Tobacco Comments   1/2 ppd 09/25/2022 hfb   Ready to quit: Not Answered Counseling given: Not Answered Tobacco comments: 1/2 ppd 09/25/2022 hfb   Outpatient Medications Prior to Visit  Medication Sig Dispense Refill   acetaminophen (TYLENOL) 325 MG tablet Take 650 mg by mouth See admin instructions. Take 650 mg every 3-4 hours as needed for pain.     albuterol (PROVENTIL) (2.5 MG/3ML) 0.083% nebulizer solution Take 3 mLs (2.5 mg total) by nebulization every 6 (six) hours as needed for wheezing or shortness of breath. 75 mL 5   ALPRAZolam (XANAX) 1 MG tablet Take 1 mg by mouth 3 (three) times daily as needed.     atorvastatin (LIPITOR) 20 MG tablet Take 20 mg by mouth at bedtime.  1   benzonatate (TESSALON) 200 MG capsule Take 1 capsule (200 mg total) by mouth 3 (three) times daily as needed for cough. 30 capsule 1   budesonide (PULMICORT) 1 MG/2ML nebulizer solution Take 2 mLs (1 mg total) by nebulization in the morning and at bedtime. 360 mL 3   clopidogrel (PLAVIX) 75 MG tablet TAKE ONE TABLET BY MOUTH EVERY DAY 90 tablet 1   diltiazem (CARDIZEM CD) 180 MG 24 hr capsule TAKE ONE CAPSULE BY MOUTH EVERY DAY  90 capsule 2   diphenhydrAMINE (BENADRYL) 25 mg capsule Take 50 mg by mouth at bedtime.     esomeprazole (NEXIUM) 20 MG capsule Take 20 mg by mouth daily at 12 noon.     FLUoxetine (PROZAC) 20 MG capsule Take  20 mg by mouth daily.     FLUoxetine (PROZAC) 40 MG capsule Take 40 mg by mouth daily.     formoterol (PERFOROMIST) 20 MCG/2ML nebulizer solution Take 2 mLs (20 mcg total) by nebulization 2 (two) times daily. 360 mL 3   gabapentin (NEURONTIN) 300 MG capsule Take 900 mg by mouth 3 (three) times daily.     glucose blood (TRUE METRIX BLOOD GLUCOSE TEST) test strip      insulin regular human CONCENTRATED (HUMULIN R) 500 UNIT/ML injection Inject 90 Units into the skin 3 (three) times daily with meals.     isosorbide mononitrate (IMDUR) 60 MG 24 hr tablet Take 1 tablet (60 mg total) by mouth in the morning. & 30 mg in the evening 135 tablet 3   linagliptin (TRADJENTA) 5 MG TABS tablet Take 5 mg by mouth daily.     midodrine (PROAMATINE) 2.5 MG tablet Take 1 tablet (2.5 mg total) by mouth 2 (two) times daily. 60 tablet 6   nitroGLYCERIN (NITROSTAT) 0.4 MG SL tablet Place 1 tablet (0.4 mg total) under the tongue every 5 (five) minutes x 3 doses as needed for chest pain (if no relief after 2nd dose, proceed to ED or call 911). 25 tablet 3   oxyCODONE-acetaminophen (PERCOCET) 10-325 MG tablet Take 1 tablet by mouth daily as needed for pain.     potassium chloride SA (K-DUR,KLOR-CON) 20 MEQ tablet Take 20 mEq by mouth 2 (two) times daily.      pregabalin (LYRICA) 25 MG capsule Take 25 mg by mouth 2 (two) times daily.     Probiotic Product (PROBIOTIC PO) Take 1 capsule by mouth daily.     ranolazine (RANEXA) 500 MG 12 hr tablet Take 1 tablet (500 mg total) by mouth 2 (two) times daily. 60 tablet 6   revefenacin (YUPELRI) 175 MCG/3ML nebulizer solution Take 3 mLs (175 mcg total) by nebulization daily. 270 mL 3   tamsulosin (FLOMAX) 0.4 MG CAPS capsule Take 0.4 mg by mouth daily.      torsemide (DEMADEX)  20 MG tablet TAKE THREE TABLETS BY MOUTH TWICE DAILY (Patient taking differently: Take 60 mg by mouth 2 (two) times daily.) 540 tablet 0   zolpidem (AMBIEN) 5 MG tablet Take 5 mg by mouth at bedtime. For insomnia.     predniSONE (DELTASONE) 10 MG tablet 4 tabs for 5 days, then 3 tabs for 5 days, 2 tabs for 5 days, then stay on 1 tablet daily until you are seen back. Take in AM with food. 45 tablet 0   No facility-administered medications prior to visit.     Review of Systems:   Constitutional: No weight loss or gain, night sweats, fevers, chills +fatigue, lassitude (improved)  HEENT: No headaches, difficulty swallowing, tooth/dental problems, or sore throat. No sneezing, itching, ear ache, nasal congestion, or post nasal drip CV:  +abdominal swelling. No chest pain, orthopnea, PND, swelling in lower extremities, anasarca, palpitations, dizziness, syncope Resp: +shortness of breath with exertion (improved); improved, rare cough. No wheezing.  No hemoptysis. No chest wall deformity GI:  No heartburn, indigestion, abdominal pain, nausea, vomiting, diarrhea, change in bowel habits, loss of appetite, bloody stools.  GU: No dysuria, change in color of urine, urgency or frequency.   Skin: No rash, lesions, ulcerations MSK:  No increased joint pain or swelling.   Neuro: No gait abnormalities.  Psych: No anxiety. +depression. No SI/HI. Mood stable.     Physical Exam:  BP (!) 90/50 (BP Location: Right Arm,  Patient Position: Sitting, Cuff Size: Normal)   Pulse 83   Temp 97.8 F (36.6 C) (Oral)   Ht '5\' 9"'$  (1.753 m)   Wt 215 lb 12.8 oz (97.9 kg)   SpO2 98%   BMI 31.87 kg/m   GEN: Pleasant, interactive, chronically-ill appearing; obese; in no acute distress. HEENT:  Normocephalic and atraumatic. PERRLA. Sclera white. Nasal turbinates pink, moist and patent bilaterally. No rhinorrhea present. Oropharynx pink and moist, without exudate or edema. No lesions, ulcerations, or postnasal drip.  NECK:   Supple w/ fair ROM. No JVD present. Normal carotid impulses w/o bruits. Thyroid symmetrical with no goiter or nodules palpated. No lymphadenopathy.   CV: RRR, no m/r/g, no peripheral edema. Pulses intact, +2 bilaterally. No cyanosis, pallor or clubbing. PULMONARY:  Unlabored, regular breathing. Minimal rhonchi b/l bases otherwise clear A&P w/o wheezes/rales/rhonchi. No accessory muscle use.  GI: BS present and normoactive. Soft, non-tender to palpation. Protuberant. No organomegaly or masses detected.  MSK: No erythema, warmth or tenderness. Cap refil <2 sec all extrem. No deformities or joint swelling noted.  Neuro: A/Ox3. No focal deficits noted.   Skin: Warm, no lesions or rashe Psych: Normal affect and behavior. Judgement and thought content appropriate.     Lab Results:  CBC    Component Value Date/Time   WBC 7.8 05/24/2022 1842   RBC 5.31 05/24/2022 1842   HGB 14.3 05/24/2022 1842   HCT 43.8 05/24/2022 1842   PLT 221 05/24/2022 1842   MCV 82.5 05/24/2022 1842   MCH 26.9 05/24/2022 1842   MCHC 32.6 05/24/2022 1842   RDW 13.8 05/24/2022 1842   LYMPHSABS 1.5 05/24/2022 1842   MONOABS 0.5 05/24/2022 1842   EOSABS 0.1 05/24/2022 1842   BASOSABS 0.0 05/24/2022 1842    BMET    Component Value Date/Time   NA 137 05/24/2022 1842   K 3.9 05/24/2022 1842   CL 108 05/24/2022 1842   CO2 22 05/24/2022 1842   GLUCOSE 180 (H) 05/24/2022 1842   BUN 21 05/24/2022 1842   CREATININE 1.28 (H) 05/24/2022 1842   CREATININE 1.89 (H) 04/24/2021 1524   CALCIUM 8.8 (L) 05/24/2022 1842   GFRNONAA >60 05/24/2022 1842   GFRAA 42 (L) 04/12/2020 1348    BNP    Component Value Date/Time   BNP 84.2 04/12/2020 1348     Imaging:  DG Chest 2 View  Result Date: 09/04/2022 CLINICAL DATA:  Productive cough, hypoxia EXAM: CHEST - 2 VIEW COMPARISON:  06/26/2022 FINDINGS: Frontal and lateral views of the chest demonstrate a stable cardiac silhouette. Chronic left pleural thickening and left  basilar pleural and parenchymal scarring. No acute airspace disease, effusion, or pneumothorax. Central vascular congestion. No acute bony abnormality. IMPRESSION: 1. Central vascular congestion without overt edema. 2. No acute airspace disease. 3. Stable left-sided pleural and parenchymal scarring. Electronically Signed   By: Randa Ngo M.D.   On: 09/04/2022 15:50    albuterol (PROVENTIL) (2.5 MG/3ML) 0.083% nebulizer solution 2.5 mg     Date Action Dose Route User   09/16/2022 1543 Given 2.5 mg Nebulization Mellody Life E, CMA      methylPREDNISolone acetate (DEPO-MEDROL) injection 80 mg     Date Action Dose Route User   09/16/2022 1535 Given 80 mg Intramuscular (Left Ventrogluteal) Priscille Kluver, CMA          Latest Ref Rng & Units 09/04/2019   11:01 AM 09/08/2018   10:21 AM 09/07/2014   11:39 AM 04/13/2014   11:43 AM  PFT Results  FVC-Pre L 3.47  3.80  4.03  4.37   FVC-Predicted Pre % 74  81  83  90   FVC-Post L   4.29  4.43   FVC-Predicted Post %   89  92   Pre FEV1/FVC % % 77  76  81  82   Post FEV1/FCV % %   83  83   FEV1-Pre L 2.68  2.88  3.28  3.58   FEV1-Predicted Pre % 77  82  90  97   FEV1-Post L   3.58  3.67   DLCO uncorrected ml/min/mmHg 14.15  13.84  16.41  17.92   DLCO UNC% % 52  51  50  55   DLVA Predicted % 71  62  57  63   TLC L   6.36  6.11   TLC % Predicted %   90  87   RV % Predicted %   85  76     No results found for: "NITRICOXIDE"      Assessment & Plan:   COPD with emphysema ([name]) Moderate COPD with high symptom burden and slow to resolve recent exacerbation. He looks the best I have seen him in the past few weeks. Clinically improving. He will continue current taper and remain on low dose daily steroids. Action plan in place. Encouraged him to remain active. Smoking cessation strongly advised but not entertained by patient. Will continue to have discussions moving forward. Educated on the effects continued smoking has on his lung  function.  Patient Instructions  Stop Breztri. You will only use your nebulized treatments Continue Budesonide neb 2 mL Twice daily. Brush tongue and rinse mouth afterwards Continue Formoterol (performist) 2 mL Twice daily. Can use with budesonide Continue Revefenacin (Yupelri) 3 mL once daily Continue Mucinex DM over the counter Twice daily for cough/congestion Continue Benzonatate 1 capsule Three times a day for cough  Continue Albuterol inhaler 2 puffs or 3 mL neb every 6 hours as needed for shortness of breath or wheezing. Notify if symptoms persist despite rescue inhaler/neb use. Use your neb treatments four times a day until symptoms improve  Continue nexium 1 tab daily Continue supplemental oxygen 5-6 lpm for goal >88-90%.    Finish prednisone taper and then start taking 5 mg daily. Take in AM with food    Labs today- I will call you if we need to adjust your fluid pills Monitor blood pressure at home for goal >100/60 and <140/90. Call your heart doctor or primary care doctor if unable to maintain a normal blood pressure  Follow up in 6 weeks with Dr. Chase Caller (1st) or Katie Daune Divirgilio,NP. If symptoms do not improve or worsen, please contact office for sooner follow up or seek emergency care.   Chronic respiratory failure with hypoxia ([name]) Stable without increased O2 requirement. Sats improved today; up from 92% to 98% on 6 lpm. Using 5 lpm on home concentrator. POC not working properly so he has been alternating between this and his portable tanks. Goal [id number]  Chronic diastolic heart failure ([name]) Abdominal distention. Soft and BS nl on exam today. Weight is stable. Possible due to prednisone use vs acute CHF exacerbation. His CXR from 2/16 did show vascular congestion. He is compliant with torsemide per his report. Check BNP and BMET today. May consider increasing diuretics for short duration and obtaining echo, dependent on results. Dyspnea improved with steroids and neb therapies.  Follow up with cardiology as scheduled.   Other  specified hypotension BP 90/50 today. He tells me that this is normal for him; usually somewhere between 90-100. Asymptomatic. Advised him to closely monitor at home. If no improvement, he will contact cardiology for medication adjustments. ED precautions advised.     I spent 35 minutes of dedicated to the care of this patient on the date of this encounter to include pre-visit review of records, face-to-face time with the patient discussing conditions above, post visit ordering of testing, clinical documentation with the electronic health record, making appropriate referrals as documented, and communicating necessary findings to members of the patients care team.  Clayton Bibles, NP 09/25/2022  Pt aware and understands NP's role.

## 2022-09-28 ENCOUNTER — Telehealth: Payer: Self-pay | Admitting: Nurse Practitioner

## 2022-09-28 NOTE — Progress Notes (Signed)
Please notify patient that his BNP (hormone that the heart produces when it has to work too hard) was normal, which is good news. His kidney function has declined though, which could be why he is not urinating as often with his torsemide. He needs to call his PCP today to schedule a f/u appt and determine next steps. Thanks.

## 2022-09-28 NOTE — Telephone Encounter (Signed)
Needs Clinical notes and insurance

## 2022-09-29 ENCOUNTER — Other Ambulatory Visit: Payer: Self-pay | Admitting: Nurse Practitioner

## 2022-09-29 DIAGNOSIS — J441 Chronic obstructive pulmonary disease with (acute) exacerbation: Secondary | ICD-10-CM

## 2022-09-29 NOTE — Telephone Encounter (Signed)
Clinical notes/insurance waiting to be faxed to [facility]

## 2022-10-05 ENCOUNTER — Ambulatory Visit (INDEPENDENT_AMBULATORY_CARE_PROVIDER_SITE_OTHER)
Admission: RE | Admit: 2022-10-05 | Discharge: 2022-10-05 | Disposition: A | Discharge status: Home / Self Care | Payer: 59 | Source: Ambulatory Visit | Attending: Surgery | Admitting: Surgery

## 2022-10-05 ENCOUNTER — Ambulatory Visit (INDEPENDENT_AMBULATORY_CARE_PROVIDER_SITE_OTHER): Payer: 59 | Admitting: Physician Assistant

## 2022-10-05 ENCOUNTER — Ambulatory Visit (HOSPITAL_COMMUNITY)
Admission: RE | Admit: 2022-10-05 | Discharge: 2022-10-05 | Disposition: A | Discharge status: Home / Self Care | Payer: 59 | Source: Ambulatory Visit | Attending: Surgery | Admitting: Surgery

## 2022-10-05 DIAGNOSIS — I771 Stricture of artery: Secondary | ICD-10-CM | POA: Diagnosis not present

## 2022-10-05 DIAGNOSIS — I739 Peripheral vascular disease, unspecified: Secondary | ICD-10-CM | POA: Diagnosis not present

## 2022-10-05 DIAGNOSIS — I6529 Occlusion and stenosis of unspecified carotid artery: Secondary | ICD-10-CM | POA: Insufficient documentation

## 2022-10-05 LAB — VAS US ABI WITH/WO TBI
Left ABI: 1.15
Right ABI: 0.73

## 2022-10-06 ENCOUNTER — Encounter: Payer: Self-pay | Admitting: Physician Assistant

## 2022-10-06 ENCOUNTER — Telehealth: Payer: Self-pay | Admitting: Nurse Practitioner

## 2022-10-06 DIAGNOSIS — J441 Chronic obstructive pulmonary disease with (acute) exacerbation: Secondary | ICD-10-CM

## 2022-10-06 MED ORDER — BUDESONIDE 1 MG/2ML IN SUSP: 1.0000 mg | Freq: Two times a day (BID) | RESPIRATORY_TRACT | 3 refills | Status: DC

## 2022-10-06 NOTE — Telephone Encounter (Signed)
[facility] calling. RX they recd for this PT budesonide (PULMICORT) 1 MG/2ML nebulizer solution is giving the wrong dosage. Pls call or refax.  States  2 ml twice a day and then "total 1 milligram" .) Vials can not be broken she states.

## 2022-10-06 NOTE — Telephone Encounter (Signed)
Updated order with directions. Nothing further needed

## 2022-10-06 NOTE — Progress Notes (Unsigned)
Virtual Visit via Telephone Note   I connected with [name] on 10/06/2022 using the [employer] by telephone.  Patient was located at home and accompanied by his wife. I am located at [facility] office.   The limitations of evaluation and management by telemedicine and the availability of in person appointments have been previously discussed with the patient and are documented in the patients chart. The patient expressed understanding and consented to proceed.  PCP: [name], Girtha Rm, FNP   History of Present Illness: [name] is a 67 y.o. male who is followed for carotid artery stenosis as well as PAD.  He has history of left SFA stenting by Dr. Trula Slade on 07/17/2021 due to nonhealing left foot wound.  He eventually healed a transmetatarsal amputation by podiatry.  He denies any new tissue changes of bilateral lower extremities.  He also is without rest pain or claudication currently.  Surgical history also significant for right carotid endarterectomy performed by Dr. Donnetta Hutching due to high-grade asymptomatic stenosis.  Past Medical History:  Diagnosis Date   Anxiety    Arthritis    Cholecystitis, acute 12/20/2013   Lap chole 6/5   Cocaine abuse ([place])    Community acquired pneumonia 05/31/2018   COPD (chronic obstructive pulmonary disease) ([name])    Coronary atherosclerosis of native coronary artery    a. 03/09/2013 Cath/PCI: LM nl, LAD: 50p, 74m (2.5x16 promus DES), LCX nl, OM1 min irregs, LPL/LPDA diff dzs, RCA nondom, mod diff dzs, EF 55%.   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 ([place]) 04/24/2021   History of nephrolithiasis    History of pneumonia    History of stroke    Right MCA distribution, residual left-sided weakness   Hyperlipidemia    Metabolic encephalopathy    Noncompliance    Osteomyelitis of second toe of left foot ([place])  04/24/2021   Renal cell carcinoma ([place])    Status post radical right nephrectomy August 2015   Respiratory failure with hypoxia ([place])    Sepsis ([place]) 05/19/2019   Sleep apnea    On CPAP, 4L O2 no cpap at home yet   Type 2 diabetes mellitus ([place]) 2011  ***  Past Surgical History:  Procedure Laterality Date   ABDOMINAL AORTOGRAM W/LOWER EXTREMITY Left 07/17/2021   Procedure: ABDOMINAL AORTOGRAM W/LOWER EXTREMITY;  Surgeon: Serafina Mitchell, MD;  Location: [place] [facility];  Service: Cardiovascular;  Laterality: Left;   AMPUTATION Left 12/27/2020   Procedure: PARTIAL FIRST RAY AMPUTATION OF FOOT;  Surgeon: Felipa Furnace, DPM;  Location: [place];  Service: Podiatry;  Laterality: Left;   APPENDECTOMY  1970's   CHOLECYSTECTOMY N/A 12/22/2013   Procedure: LAPAROSCOPIC CHOLECYSTECTOMY ;  Surgeon: [name], MD;  Location: [place];  Service: General;  Laterality: N/A;   ENDARTERECTOMY Right 10/08/2014   Procedure: Right ENDARTERECTOMY CAROTID;  Surgeon: [name], MD;  Location: [place];  Service: Vascular;  Laterality: Right;   LAPAROSCOPIC LYSIS OF ADHESIONS  02/21/2014   Procedure: LAPAROSCOPIC LYSIS OF ADHESIONS EXTINSIVE;  Surgeon: [name], MD;  Location: [facility];  Service: Urology;;   LEFT HEART CATHETERIZATION WITH CORONARY ANGIOGRAM N/A 06/02/2012   Procedure: LEFT HEART CATHETERIZATION WITH CORONARY ANGIOGRAM;  Surgeon: [name], MD;  Location[facility] CATH LAB;  Service: Cardiovascular;  Laterality: N/A;  LEFT HEART CATHETERIZATION WITH CORONARY ANGIOGRAM N/A 03/09/2013   Procedure: LEFT HEART CATHETERIZATION WITH CORONARY ANGIOGRAM;  Surgeon: [name], MD;  Location: [facility] CATH LAB;  Service: Cardiovascular;  Laterality: N/A;   LEFT HEART CATHETERIZATION WITH CORONARY ANGIOGRAM N/A 12/20/2013   Procedure: LEFT HEART CATHETERIZATION WITH CORONARY ANGIOGRAM;  Surgeon: [name], MD;  Location: [facility] CATH LAB;  Service: Cardiovascular;  Laterality: N/A;   LUNG  BIOPSY Left 05/18/2014   Procedure: LUNG BIOPSY left upper lobe & left lower lobe;  Surgeon: [name], MD;  Location: [place];  Service: Thoracic;  Laterality: Left;   PATCH ANGIOPLASTY Right 10/08/2014   Procedure: PATCH ANGIOPLASTY Right Carotid;  Surgeon: [name], MD;  Location: [place];  Service: Vascular;  Laterality: Right;   PERIPHERAL VASCULAR INTERVENTION Left 07/17/2021   Procedure: PERIPHERAL VASCULAR INTERVENTION;  Surgeon: Serafina Mitchell, MD;  Location: [place] [facility];  Service: Cardiovascular;  Laterality: Left;  Superficial Femoral Artery   ROBOT ASSISTED LAPAROSCOPIC NEPHRECTOMY Right 02/21/2014   Procedure: ROBOTIC ASSISTED LAPAROSCOPIC RIGHT NEPHRECTOMY ;  Surgeon: [name], MD;  Location: [facility];  Service: Urology;  Laterality: Right;   VIDEO ASSISTED THORACOSCOPY Left 05/18/2014   Procedure: LEFT VIDEO ASSISTED THORACOSCOPY;  Surgeon: [name], MD;  Location: [place];  Service: Thoracic;  Laterality: Left;   VIDEO BRONCHOSCOPY  05/18/2014   Procedure: VIDEO BRONCHOSCOPY;  Surgeon: [name], MD;  Location: [place];  Service: Thoracic;;  ***  No outpatient medications have been marked as taking for the 10/05/22 encounter (Appointment) with Dagoberto Ligas, PA-C.    12 system ROS was negative unless otherwise noted in HPI***   Observations/Objective:   Assessment and Plan:   Follow Up Instructions:   Follow up {follow up:15908}   I discussed the assessment and treatment plan with the patient. The patient was provided an opportunity to ask questions and all were answered. The patient agreed with the plan and demonstrated an understanding of the instructions.   The patient was advised to call back or seek an in-person evaluation if the symptoms worsen or if the condition fails to improve as anticipated.  I spent *** minutes with the patient via telephone encounter.   Signed, Dagoberto Ligas [facility]  [place] Office: [phone]  10/06/2022, 12:46 PM

## 2022-10-07 ENCOUNTER — Other Ambulatory Visit: Payer: Self-pay

## 2022-10-07 ENCOUNTER — Ambulatory Visit: Payer: 59 | Attending: Cardiology | Admitting: Cardiology

## 2022-10-07 ENCOUNTER — Encounter: Payer: Self-pay | Admitting: Cardiology

## 2022-10-07 VITALS — BP 100/68 | HR 90 | Ht 69.0 in | Wt 216.6 lb

## 2022-10-07 DIAGNOSIS — I5032 Chronic diastolic (congestive) heart failure: Secondary | ICD-10-CM | POA: Diagnosis not present

## 2022-10-07 DIAGNOSIS — I25119 Atherosclerotic heart disease of native coronary artery with unspecified angina pectoris: Secondary | ICD-10-CM | POA: Diagnosis not present

## 2022-10-07 DIAGNOSIS — I739 Peripheral vascular disease, unspecified: Secondary | ICD-10-CM | POA: Diagnosis not present

## 2022-10-07 NOTE — Patient Instructions (Addendum)

## 2022-10-07 NOTE — Progress Notes (Signed)
    Cardiology Office Note  Date: 10/07/2022   ID: Tarrence, Romick 1955-09-22, MRN [id number]  History of Present Illness: [name] is a 67 y.o. male last seen in February by Ms. Arlington Calix NP, I reviewed the note.  He is here for a follow-up visit.  I reviewed interval chart follow-up, he has seen Pulmonary and [facility]VS.  Still has good days and bad days in terms of his breathing status.  He is on 6 L of oxygen via nasal cannula at home.  No recent cough, fevers or chills.  Has felt relatively weak.  Sometimes does not get out of the bed all day.  I went over his medications.  He did tolerate addition of Ranexa and feels like this has lessened nitroglycerin use overall.  Otherwise his cardiac regimen remained stable.  Current vital signs did not allow further up titration.  Physical Exam: VS:  BP 100/68   Pulse 90   Ht 5\' 9"  (1.753 m)   Wt 216 lb 9.6 oz (98.2 kg)   SpO2 93%   BMI 31.99 kg/m , BMI Body mass index is 31.99 kg/m.  Wt Readings from Last 3 Encounters:  10/07/22 216 lb 9.6 oz (98.2 kg)  09/25/22 215 lb 12.8 oz (97.9 kg)  09/16/22 215 lb (97.5 kg)    General: Patient appears comfortable at rest.  Wearing oxygen via nasal cannula. HEENT: Conjunctiva and lids normal. Neck: Supple, no elevated JVP. Lungs: Diffuse rhonchi with prolonged expiratory phase. Cardiac: R distant regular heart sounds without gallop. Extremities: Mild ankle edema.  ECG:  An ECG dated 06/26/2022 was personally reviewed today and demonstrated:  Sinus rhythm with left anterior fascicular block.  Labwork: 05/24/2022: ALT 18; AST 16; Hemoglobin 14.3; Platelets 221 09/25/2022: BUN 53; Creatinine, Ser 2.05; Potassium 4.2; Pro B Natriuretic peptide (BNP) 54.0; Sodium 136   Other Studies Reviewed Today:  No interval cardiac testing for review today (echocardiogram not completed ordered at last visit).  Assessment and Plan:  1.  CAD status post DES to the mid LAD in 2014 with otherwise medically managed  disease.  He tolerated the addition of Ranexa with relative improvement in nitroglycerin use.  Continue Plavix, Lipitor, Cardizem CD, Imdur, and Ranexa.  2.  PAD status post left SFA stenting in December 2022, prior transmetatarsal amputation on that side.  3.  Carotid artery disease status post right CEA.  Carotid Dopplers recently showed 40 to 59% RICA stenosis and occluded LICA.  4.  HFpEF.  LVEF 55 to 60% with grade 1 diastolic dysfunction by echocardiogram in 2022.  He is on Demadex with potassium supplement, weight is stable and he reports no progressive leg swelling at this time.  5.  History of essential hypertension.  Blood pressure has been lower in the setting of medication requirements.  He is actually on ProAmatine at this point with stable blood pressure.  6.  COPD with chronic hypoxic respiratory failure.  Disposition:  Follow up  6 months.  Signed, Satira Sark, M.D., F.A.C.C.

## 2022-10-13 DIAGNOSIS — I5032 Chronic diastolic (congestive) heart failure: Secondary | ICD-10-CM | POA: Diagnosis not present

## 2022-10-13 DIAGNOSIS — G894 Chronic pain syndrome: Secondary | ICD-10-CM | POA: Diagnosis not present

## 2022-10-13 DIAGNOSIS — J849 Interstitial pulmonary disease, unspecified: Secondary | ICD-10-CM | POA: Diagnosis not present

## 2022-10-13 DIAGNOSIS — J9611 Chronic respiratory failure with hypoxia: Secondary | ICD-10-CM | POA: Diagnosis not present

## 2022-10-13 DIAGNOSIS — N1832 Chronic kidney disease, stage 3b: Secondary | ICD-10-CM | POA: Diagnosis not present

## 2022-10-13 DIAGNOSIS — Z794 Long term (current) use of insulin: Secondary | ICD-10-CM | POA: Diagnosis not present

## 2022-10-13 DIAGNOSIS — F5101 Primary insomnia: Secondary | ICD-10-CM | POA: Diagnosis not present

## 2022-10-13 DIAGNOSIS — E11621 Type 2 diabetes mellitus with foot ulcer: Secondary | ICD-10-CM | POA: Diagnosis not present

## 2022-10-13 DIAGNOSIS — E1151 Type 2 diabetes mellitus with diabetic peripheral angiopathy without gangrene: Secondary | ICD-10-CM | POA: Diagnosis not present

## 2022-10-21 ENCOUNTER — Other Ambulatory Visit: Payer: Self-pay | Admitting: Cardiology

## 2022-10-22 ENCOUNTER — Telehealth: Payer: Self-pay

## 2022-10-22 NOTE — Telephone Encounter (Signed)
LOV notes faxed to [facility], after 3 failed attempts. Closing encounter.

## 2022-10-28 ENCOUNTER — Ambulatory Visit (INDEPENDENT_AMBULATORY_CARE_PROVIDER_SITE_OTHER): Payer: 59 | Admitting: Podiatry

## 2022-10-28 DIAGNOSIS — Z9889 Other specified postprocedural states: Secondary | ICD-10-CM

## 2022-10-28 DIAGNOSIS — Z89432 Acquired absence of left foot: Secondary | ICD-10-CM

## 2022-10-28 DIAGNOSIS — E1142 Type 2 diabetes mellitus with diabetic polyneuropathy: Secondary | ICD-10-CM

## 2022-10-28 NOTE — Progress Notes (Signed)
Patient presents to the office today for diabetic shoe and insole measuring.  ABN signed.   Documentation of medical necessity will be sent to patient's treating diabetic doctor to verify and sign.   Patient's diabetic provider: DR Kathreen Cosier [phone]   Shoes and insoles will be ordered at that time and patient will be notified for an appointment for fitting when they arrive.   Patient shoe selection-   1st   Shoe choice:   [id number]  Shoe size ordered: 9.5 W

## 2022-11-06 ENCOUNTER — Ambulatory Visit: Payer: 59 | Admitting: Nurse Practitioner

## 2022-11-12 DIAGNOSIS — E119 Type 2 diabetes mellitus without complications: Secondary | ICD-10-CM | POA: Diagnosis not present

## 2022-11-12 DIAGNOSIS — Z794 Long term (current) use of insulin: Secondary | ICD-10-CM | POA: Diagnosis not present

## 2022-11-16 NOTE — Progress Notes (Signed)
Letter was mailed to pt w/ results - NFN

## 2022-11-16 NOTE — Progress Notes (Signed)
Notified pt via letter - NFN

## 2022-11-17 ENCOUNTER — Ambulatory Visit: Payer: 59 | Admitting: Nurse Practitioner

## 2022-11-26 ENCOUNTER — Ambulatory Visit: Payer: 59 | Admitting: Nurse Practitioner

## 2022-11-30 ENCOUNTER — Ambulatory Visit: Payer: 59 | Admitting: Nurse Practitioner

## 2022-12-02 ENCOUNTER — Ambulatory Visit: Payer: 59 | Admitting: Podiatry

## 2022-12-09 ENCOUNTER — Ambulatory Visit (INDEPENDENT_AMBULATORY_CARE_PROVIDER_SITE_OTHER): Payer: 59 | Admitting: Podiatry

## 2022-12-09 DIAGNOSIS — Z794 Long term (current) use of insulin: Secondary | ICD-10-CM

## 2022-12-09 DIAGNOSIS — E0865 Diabetes mellitus due to underlying condition with hyperglycemia: Secondary | ICD-10-CM | POA: Diagnosis not present

## 2022-12-09 DIAGNOSIS — S90852A Superficial foreign body, left foot, initial encounter: Secondary | ICD-10-CM

## 2022-12-09 DIAGNOSIS — S91332A Puncture wound without foreign body, left foot, initial encounter: Secondary | ICD-10-CM

## 2022-12-09 DIAGNOSIS — L539 Erythematous condition, unspecified: Secondary | ICD-10-CM

## 2022-12-09 MED ORDER — DOXYCYCLINE HYCLATE 100 MG PO TABS: 100.0000 mg | ORAL_TABLET | Freq: Two times a day (BID) | ORAL | 0 refills | Status: DC

## 2022-12-09 NOTE — Progress Notes (Signed)
Subjective:  Patient ID: [name], male    DOB: 1955/10/10,  MRN: [id number]  Chief Complaint  Patient presents with   Nail Problem    Nail trim    67 y.o. male presents with the above complaint.  Patient presents with complaint of left midfoot foreign body.  Patient states that he may have stepped on something few days ago.  When to get it evaluated appears to be very superficial.  Patient may have stepped on some staples.  He has not seen anyone else prior to seeing me denies any other acute complaints he is a diabetic with history of TMA to the left side.   Review of Systems: Negative except as noted in the HPI. Denies N/V/F/Ch.  Past Medical History:  Diagnosis Date   Anxiety    Arthritis    Cholecystitis, acute 12/20/2013   Lap chole 6/5   Cocaine abuse ([name])    Community acquired pneumonia 05/31/2018   COPD (chronic obstructive pulmonary disease) ([name])    Coronary atherosclerosis of native coronary artery    a. 03/09/2013 Cath/PCI: LM nl, LAD: 50p, 17m (2.5x16 promus DES), LCX nl, OM1 min irregs, LPL/LPDA diff dzs, RCA nondom, mod diff dzs, EF 55%.   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 ([name]) 04/24/2021   History of nephrolithiasis    History of pneumonia    History of stroke    Right MCA distribution, residual left-sided weakness   Hyperlipidemia    Metabolic encephalopathy    Noncompliance    Osteomyelitis of second toe of left foot ([name]) 04/24/2021   Renal cell carcinoma ([name])    Status post radical right nephrectomy August 2015   Respiratory failure with hypoxia ([name])    Sepsis ([name]) 05/19/2019   Sleep apnea    On CPAP, 4L O2 no cpap at home yet   Type 2 diabetes mellitus ([name]) 2011    Current Outpatient Medications:    doxycycline (VIBRA-TABS) 100 MG tablet, Take 1 tablet (100 mg total) by mouth 2 (two) times  daily., Disp: 20 tablet, Rfl: 0   acetaminophen (TYLENOL) 325 MG tablet, Take 650 mg by mouth See admin instructions. Take 650 mg every 3-4 hours as needed for pain., Disp: , Rfl:    albuterol (PROVENTIL) (2.5 MG/3ML) 0.083% nebulizer solution, Take 3 mLs (2.5 mg total) by nebulization every 6 (six) hours as needed for wheezing or shortness of breath., Disp: 75 mL, Rfl: 5   ALPRAZolam (XANAX) 1 MG tablet, Take 1 mg by mouth 3 (three) times daily as needed., Disp: , Rfl:    atorvastatin (LIPITOR) 20 MG tablet, Take 20 mg by mouth at bedtime., Disp: , Rfl: 1   benzonatate (TESSALON) 200 MG capsule, TAKE ONE CAPSULE BY MOUTH THREE TIMES DAILY AS NEEDED FOR COUGH, Disp: 30 capsule, Rfl: 1   budesonide (PULMICORT) 1 MG/2ML nebulizer solution, Take 2 mLs (1 mg total) by nebulization in the morning and at bedtime., Disp: 360 mL, Rfl: 3   clopidogrel (PLAVIX) 75 MG tablet, TAKE ONE TABLET BY MOUTH EVERY DAY, Disp: 90 tablet, Rfl: 1   diltiazem (CARDIZEM CD) 180 MG 24 hr capsule, TAKE ONE CAPSULE BY MOUTH EVERY DAY, Disp: 90 capsule, Rfl: 2   diphenhydrAMINE (BENADRYL) 25 mg capsule, Take 50 mg by mouth at bedtime., Disp: , Rfl:  esomeprazole (NEXIUM) 20 MG capsule, Take 20 mg by mouth daily at 12 noon., Disp: , Rfl:    FLUoxetine (PROZAC) 20 MG capsule, Take 20 mg by mouth daily., Disp: , Rfl:    FLUoxetine (PROZAC) 40 MG capsule, Take 40 mg by mouth daily., Disp: , Rfl:    formoterol (PERFOROMIST) 20 MCG/2ML nebulizer solution, Take 2 mLs (20 mcg total) by nebulization 2 (two) times daily., Disp: 360 mL, Rfl: 3   gabapentin (NEURONTIN) 300 MG capsule, Take 900 mg by mouth 3 (three) times daily., Disp: , Rfl:    glucose blood (TRUE METRIX BLOOD GLUCOSE TEST) test strip, , Disp: , Rfl:    insulin regular human CONCENTRATED (HUMULIN R) 500 UNIT/ML injection, Inject 90 Units into the skin 3 (three) times daily with meals., Disp: , Rfl:    isosorbide mononitrate (IMDUR) 60 MG 24 hr tablet, Take 1 tablet (60  mg total) by mouth in the morning. & 30 mg in the evening, Disp: 135 tablet, Rfl: 3   linagliptin (TRADJENTA) 5 MG TABS tablet, Take 5 mg by mouth daily., Disp: , Rfl:    midodrine (PROAMATINE) 2.5 MG tablet, Take 1 tablet (2.5 mg total) by mouth 2 (two) times daily., Disp: 60 tablet, Rfl: 6   nitroGLYCERIN (NITROSTAT) 0.4 MG SL tablet, Place 1 tablet (0.4 mg total) under the tongue every 5 (five) minutes x 3 doses as needed for chest pain (if no relief after 2nd dose, proceed to ED or call 911)., Disp: 25 tablet, Rfl: 3   oxyCODONE-acetaminophen (PERCOCET) 10-325 MG tablet, Take 1 tablet by mouth daily as needed for pain., Disp: , Rfl:    potassium chloride SA (K-DUR,KLOR-CON) 20 MEQ tablet, Take 20 mEq by mouth 2 (two) times daily. , Disp: , Rfl:    pregabalin (LYRICA) 25 MG capsule, Take 25 mg by mouth 2 (two) times daily., Disp: , Rfl:    Probiotic Product (PROBIOTIC PO), Take 1 capsule by mouth daily., Disp: , Rfl:    ranolazine (RANEXA) 500 MG 12 hr tablet, Take 1 tablet (500 mg total) by mouth 2 (two) times daily., Disp: 60 tablet, Rfl: 6   revefenacin (YUPELRI) 175 MCG/3ML nebulizer solution, Take 3 mLs (175 mcg total) by nebulization daily., Disp: 270 mL, Rfl: 3   tamsulosin (FLOMAX) 0.4 MG CAPS capsule, Take 0.4 mg by mouth daily. , Disp: , Rfl:    torsemide (DEMADEX) 20 MG tablet, TAKE THREE TABLETS BY MOUTH TWICE DAILY (Patient taking differently: Take 60 mg by mouth 2 (two) times daily.), Disp: 540 tablet, Rfl: 0   zolpidem (AMBIEN) 5 MG tablet, Take 5 mg by mouth at bedtime. For insomnia., Disp: , Rfl:   Social History   Tobacco Use  Smoking Status Every Day   Packs/day: 1.00   Years: 41.00   Additional pack years: 0.00   Total pack years: 41.00   Types: Cigarettes   Start date: 05/03/1972   Passive exposure: Never  Smokeless Tobacco Never  Tobacco Comments   1/2 ppd 09/25/2022 hfb    Allergies  Allergen Reactions   Lisinopril Other (See Comments)    Other reaction(s):  worsening kidney function   Pollen Extract     Other reaction(s): sneezing   Oxycodone Itching and Nausea Only    Pt is able to tolerate if taken with benadryl   Objective:  There were no vitals filed for this visit. There is no height or weight on file to calculate BMI. Constitutional Well developed. Well nourished.  Vascular  Dorsalis pedis pulses palpable bilaterally. Posterior tibial pulses palpable bilaterally. Capillary refill normal to all digits.  No cyanosis or clubbing noted. Pedal hair growth normal.  Neurologic Normal speech. Oriented to person, place, and time. Epicritic sensation to light touch grossly present bilaterally.  Dermatologic Left entry point wound noted with no erythema or purulent drainage.  Clinically unable to appreciate the staple superficially.  Open wound noted appears limited to fat layer.  Does not probe down to deep bone or muscle  Orthopedic: Normal joint ROM without pain or crepitus bilaterally. No visible deformities. No bony tenderness.   Radiographs: None Assessment:   1. Puncture wound of left foot, initial encounter   2. Erythema   3. Diabetes mellitus due to underlying condition, uncontrolled, with hyperglycemia, with long-term current use of insulin ([name])   4. Foreign body in left foot, initial encounter    Plan:  Patient was evaluated and treated and all questions answered.  Left foot foreign body staples -All questions and concerns were discussed with the patient in extensive detail.  Given the patient has neuropathy pathic severely patient does not require any local anesthesia.  Betadine was used to clean the skin using mosquito hemostat the staple was removed in its entirety through the entry point.  No purulent drainage noted.  Patient's tetanus has been updated.  Patient was placed on doxycycline for next 10 days to avoid infection.  I discussed with the patient that if it becomes red hot swollen infected foot to go to the emergency  room right away he states understanding. -Doxycycline was dispensed -Keep the wound covered with triple number to get a Band-Aid   No follow-ups on file.

## 2022-12-10 ENCOUNTER — Ambulatory Visit (INDEPENDENT_AMBULATORY_CARE_PROVIDER_SITE_OTHER): Payer: 59 | Admitting: Nurse Practitioner

## 2022-12-10 ENCOUNTER — Encounter: Payer: Self-pay | Admitting: Nurse Practitioner

## 2022-12-10 VITALS — BP 120/60 | HR 85 | Temp 97.8°F | Ht 69.0 in | Wt 222.4 lb

## 2022-12-10 DIAGNOSIS — J439 Emphysema, unspecified: Secondary | ICD-10-CM

## 2022-12-10 DIAGNOSIS — J9611 Chronic respiratory failure with hypoxia: Secondary | ICD-10-CM | POA: Diagnosis not present

## 2022-12-10 NOTE — Assessment & Plan Note (Signed)
Poorly controlled due to stopping maintenance regimen. He was re-educated on medications and proper use. Verbalized understanding. He had resolution in bronchospasm with duoneb today so will hold off on further steroids given his DM and unhealing wound that he is already being treated for. No infectious respiratory symptoms and already on doxycycline. Close follow up. Action plan in place.  Patient Instructions  Restart your nebulizer treatments.... -Budesonide neb 2 mL Twice daily. Brush tongue and rinse mouth afterwards -Formoterol (performist) 2 mL Twice daily. Can use with budesonide -Revefenacin (Yupelri) 3 mL once daily. Do this one separately  Make sure you don't stop your treatments because that if what helps control your COPD on a daily basis. Without them, you risk having a flare or going to the hospital   Continue Mucinex DM over the counter Twice daily for cough/congestion Continue Benzonatate 1 capsule Three times a day for cough  Continue Albuterol inhaler 2 puffs or 3 mL neb every 6 hours as needed for shortness of breath or wheezing. Notify if symptoms persist despite rescue inhaler/neb use.  Continue nexium 1 tab daily Continue supplemental oxygen 5-6 lpm for goal >88-90%.    Follow up in 4 weeks with Dr. Marchelle Gearing (1st) or Katie Kiara Keep,NP. If symptoms do not improve or worsen, please contact office for sooner follow up or seek emergency care.

## 2022-12-10 NOTE — Assessment & Plan Note (Signed)
Stable without increased O2 requirement. Goal >88-90% 

## 2022-12-10 NOTE — Progress Notes (Signed)
@Patient  ID: [name], male    DOB: Aug 10, 1955, 67 y.o.   MRN: [id number]  Chief Complaint  Patient presents with   Follow-up    Pt states he is still feeling the same, fatigue, depressed, sob     Referring provider: [name], [name], FNP  HPI: 67 year old male, active smoker followed for COPD and chronic respiratory failure on supplemental O2.  He is a patient Dr. Jane [name] and last seen in office 09/25/2022 by [facility] NP.  He was previously diagnosed with interstitial lung disease.  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  This was reinterpreted in February 2020 at [facility] smoking-related interstitial fibrosis associated with emphysema.  He had repeat CT in March 2022 without ILD. Past medical history significant for hypertension, CAD, carotid artery disease, diastolic CHF, GERD, DM 2, CKD stage III, history of substance abuse, anxiety, HLD.  TEST/EVENTS:  09/04/2019 PFT: FVC 74, FEV1 77, ratio 77, DLCOunc 52 05/24/2022 CT chest w con: mild cardiomegaly. Atherosclerosis. Dilated pulmonary artery. No LAD. Small hiatal hernia. Subcm peripherally calcified right thyroid nodule. Moderate emphysema. Central bronchial thickening. Chronic left lung volume loss. Areas of chronic parenchymal calcification. Areas of rounded atelectasis. No acute process. LLL nodule 5 mm, previously 3 mm. F/u CT 6 months recommended.  06/26/2022 CXR: newly seen patchy infiltrated in RLL, consistent with pna 09/04/2022 CXR: chronic left pleural thickening and left basilar pleural and parenchymal scarring. No acute process. Central vascular congestion without overt edema  12/23/2021: OV with Dr. Marchelle Gearing for follow-up.  In April 2023 he was hospitalized for encephalopathy.  Urine tox was positive for benzos and opioids which she takes on a prescription basis.  Has trouble with wheezing all the time.  Continues on 6 L supplemental O2.  Frustrated  by quality of life.  Currently COPD flare.  Could also be that he just has baseline wheezing.  Plan to treat with steroid inhaler.  Strongly encouraged him to stop smoking.  He was continued on [place]. Overall felt like his shortness of breath was multifactorial including emphysema, weight, physical deconditioning. Latest CT without evidence of ILD.  06/26/2022: OV with [name] for follow-up.  He was seen in the ED on 10/23 and treated for left lower lobe pneumonia with doxycycline course.  He was discharged home.  He then went back to the ED on 11/5 after a syncopal episode.  CT scan showed resolved infiltrates and no evidence of PE.  EKG with sinus rhythm without any significant change.  Troponins were negative.  CT of the head was unremarkable aside from chronic severe pansinusitis.  Felt as though his syncope was related to low oxygen levels and dehydration. Today, he tells me that he is still struggling with dizziness and for the last few weeks he has had an increased, productive cough. Thick, brown sputum. He also feels a little more short of breath and has noticed himself wheezing more. He denies any further syncopal events. He has not had any fevers, chills, night sweats, hemoptysis. He is eating and drinking well. He is using Breztri twice daily. No increased oxygen needs - maintain on 5-6 lpm. His POC doesn't seem to be working right. He's tried to contact his DME company to have someone look at it but he can't seem to get anyone to help him.  CXR with RLL patchy infiltrate. Treated with augmentin course and sputum cultures ordered. Pred taper for AECOPD.  09/04/2022: OV  with Kevin Grose NP for acute visit. After our last visit, he was supposed to follow up in 7-10 days but we did not see him back. He tells me that he did get better but over the past two weeks, he's had increased shortness of breath, productive cough with brown sputum, and a lot of wheezing. His chest feels a lot more congested and he has  trouble getting a deep breath in. He denies any fevers, chills, night sweats, hemoptysis, leg swelling.  He is very frustrated with how he has felt over the past few months.  He is using his nebulizer treatments a few times a day.  He uses his Breztri twice daily.  Not currently taking Mucinex.  He is still wearing 5 to 6 L of oxygen, which is his baseline.  At her last visit, we did walk him on his POC which did not seem to be working correctly.  Advised him to use continuous tanks.  He is continue to use his POC and has yet to have it evaluated by his DME company.  He did have some confusion today regarding the timeline.  He thought that the last time he had pneumonia was in January.  I do not see anywhere in his chart where he was treated for pneumonia in January.  He was in the emergency room in October for pneumonia.  He was then treated again in December for pneumonia.  Regardless, he feels like he has just declined over the last year.  He was previously referred to hospice but then transition to palliative care.  He has not seen them in quite some time.  They do have plans to come back out to see him next week.  He does feel depressed related to his symptoms.  He denies any SI/HI.  09/16/2022: OV with Kevin Shaff NP for AECOPD with empiric doxycycline and prednisone taper. CXR was without acute process and resolved pna. He tells me today that his cough has improved; no longer productive but still having some dry coughing spells. He feels like his breathing and wheezing are worse again after coming off the steroids. He was feeling better on this. Feels fatigued as well. He denies fevers, chills, hemoptysis, leg swelling, orthopnea. He has been using his Breztri twice daily. Uses his nebs multiple times a day, which he feels work well for him but don't last long. No increased oxygen requirement.   09/25/2022: OV with Kevin Heslop NP for follow up. At our last visit, we started him on nebulized triple therapy regimen and  extended prednisone taper. He is taking 30 mg of prednisone and will go to 20 mg tomorrow. He is feeling much better today. Feels like his energy levels have improved and he doesn't feel as weak. He has been able to change his bed without having to sit and rest now. His cough has mostly resolved. He does feel like his stomach is a little more swollen and shirts are tighter. He is taking his torsemide but hasn't been urinating as much. Denies any chest pain, leg swelling, palpitations, orthopnea, dizziness. No increased oxygen requirement.   12/10/2022: Today - follow up Patient presents today for follow up. He stopped his nebulizer treatments since he was here last. He had been using the Yupelri twice a day with the other nebs and it made him sick on his stomach. He didn't realize this was once daily only and to not be mixed with the others. Since coming off, he's felt a little more short  winded. He's noticed more wheezing. No increased chest congestion, cough, fevers, chills, hemoptysis, leg swelling. He struggles with feeling down when he gets like this. He denies SI/HI. He's also being treated for a unresolved wound infection with doxycycline, which is not helping his mood either.   Allergies  Allergen Reactions   Lisinopril Other (See Comments)    Other reaction(s): worsening kidney function   Pollen Extract     Other reaction(s): sneezing   Oxycodone Itching and Nausea Only    Pt is able to tolerate if taken with benadryl    Immunization History  Administered Date(s) Administered   Fluad Quad(high Dose 65+) 05/03/2019   Influenza Split 04/04/2014, 03/27/2015   Influenza, High Dose Seasonal PF 05/03/2019, 07/29/2020, 07/29/2021   Influenza,inj,quad, With Preservative 04/02/2014, 03/19/2015   Moderna Sars-Covid-2 Vaccination 03/07/2020, 04/04/2020   Pneumococcal Polysaccharide-23 11/04/2012, 02/04/2021   Zoster, Live 02/05/2020, 05/09/2020    Past Medical History:  Diagnosis Date    Anxiety    Arthritis    Cholecystitis, acute 12/20/2013   Lap chole 6/5   Cocaine abuse ([name])    Community acquired pneumonia 05/31/2018   COPD (chronic obstructive pulmonary disease) ([name])    Coronary atherosclerosis of native coronary artery    a. 03/09/2013 Cath/PCI: LM nl, LAD: 50p, 59m (2.5x16 promus DES), LCX nl, OM1 min irregs, LPL/LPDA diff dzs, RCA nondom, mod diff dzs, EF 55%.   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 ([name]) 04/24/2021   History of nephrolithiasis    History of pneumonia    History of stroke    Right MCA distribution, residual left-sided weakness   Hyperlipidemia    Metabolic encephalopathy    Noncompliance    Osteomyelitis of second toe of left foot ([name]) 04/24/2021   Renal cell carcinoma ([name])    Status post radical right nephrectomy August 2015   Respiratory failure with hypoxia ([name])    Sepsis ([name]) 05/19/2019   Sleep apnea    On CPAP, 4L O2 no cpap at home yet   Type 2 diabetes mellitus ([name]) 2011    Tobacco History: Social History   Tobacco Use  Smoking Status Every Day   Packs/day: 1.00   Years: 41.00   Additional pack years: 0.00   Total pack years: 41.00   Types: Cigarettes   Start date: 05/03/1972   Passive exposure: Never  Smokeless Tobacco Never  Tobacco Comments   1/2 ppd 09/25/2022 hfb   Ready to quit: Not Answered Counseling given: Not Answered Tobacco comments: 1/2 ppd 09/25/2022 hfb   Outpatient Medications Prior to Visit  Medication Sig Dispense Refill   acetaminophen (TYLENOL) 325 MG tablet Take 650 mg by mouth See admin instructions. Take 650 mg every 3-4 hours as needed for pain.     albuterol (PROVENTIL) (2.5 MG/3ML) 0.083% nebulizer solution Take 3 mLs (2.5 mg total) by nebulization every 6 (six) hours as needed for wheezing or shortness of breath. 75 mL 5   ALPRAZolam (XANAX) 1 MG  tablet Take 1 mg by mouth 3 (three) times daily as needed.     atorvastatin (LIPITOR) 20 MG tablet Take 20 mg by mouth at bedtime.  1   benzonatate (TESSALON) 200 MG capsule TAKE ONE CAPSULE BY MOUTH THREE TIMES DAILY AS NEEDED FOR COUGH 30 capsule 1   budesonide (PULMICORT) 1 MG/2ML nebulizer  solution Take 2 mLs (1 mg total) by nebulization in the morning and at bedtime. 360 mL 3   clopidogrel (PLAVIX) 75 MG tablet TAKE ONE TABLET BY MOUTH EVERY DAY 90 tablet 1   diltiazem (CARDIZEM CD) 180 MG 24 hr capsule TAKE ONE CAPSULE BY MOUTH EVERY DAY 90 capsule 2   diphenhydrAMINE (BENADRYL) 25 mg capsule Take 50 mg by mouth at bedtime.     doxycycline (VIBRA-TABS) 100 MG tablet Take 1 tablet (100 mg total) by mouth 2 (two) times daily. 20 tablet 0   esomeprazole (NEXIUM) 20 MG capsule Take 20 mg by mouth daily at 12 [name].     FLUoxetine (PROZAC) 20 MG capsule Take 20 mg by mouth daily.     FLUoxetine (PROZAC) 40 MG capsule Take 40 mg by mouth daily.     formoterol (PERFOROMIST) 20 MCG/2ML nebulizer solution Take 2 mLs (20 mcg total) by nebulization 2 (two) times daily. 360 mL 3   gabapentin (NEURONTIN) 300 MG capsule Take 900 mg by mouth 3 (three) times daily.     glucose blood (TRUE METRIX BLOOD GLUCOSE TEST) test strip      insulin regular human CONCENTRATED (HUMULIN R) 500 UNIT/ML injection Inject 90 Units into the skin 3 (three) times daily with meals.     isosorbide mononitrate (IMDUR) 60 MG 24 hr tablet Take 1 tablet (60 mg total) by mouth in the morning. & 30 mg in the evening 135 tablet 3   linagliptin (TRADJENTA) 5 MG TABS tablet Take 5 mg by mouth daily.     midodrine (PROAMATINE) 2.5 MG tablet Take 1 tablet (2.5 mg total) by mouth 2 (two) times daily. 60 tablet 6   nitroGLYCERIN (NITROSTAT) 0.4 MG SL tablet Place 1 tablet (0.4 mg total) under the tongue every 5 (five) minutes x 3 doses as needed for chest pain (if no relief after 2nd dose, proceed to ED or call 911). 25 tablet 3    oxyCODONE-acetaminophen (PERCOCET) 10-325 MG tablet Take 1 tablet by mouth daily as needed for pain.     potassium chloride SA (K-DUR,KLOR-CON) 20 MEQ tablet Take 20 mEq by mouth 2 (two) times daily.      pregabalin (LYRICA) 25 MG capsule Take 25 mg by mouth 2 (two) times daily.     Probiotic Product (PROBIOTIC PO) Take 1 capsule by mouth daily.     ranolazine (RANEXA) 500 MG 12 hr tablet Take 1 tablet (500 mg total) by mouth 2 (two) times daily. 60 tablet 6   revefenacin (YUPELRI) 175 MCG/3ML nebulizer solution Take 3 mLs (175 mcg total) by nebulization daily. 270 mL 3   tamsulosin (FLOMAX) 0.4 MG CAPS capsule Take 0.4 mg by mouth daily.      torsemide (DEMADEX) 20 MG tablet TAKE THREE TABLETS BY MOUTH TWICE DAILY (Patient taking differently: Take 60 mg by mouth 2 (two) times daily.) 540 tablet 0   zolpidem (AMBIEN) 5 MG tablet Take 5 mg by mouth at bedtime. For insomnia.     No facility-administered medications prior to visit.     Review of Systems:   Constitutional: No weight loss or gain, night sweats, fevers, chills, lassitude +fatigue HEENT: No headaches, difficulty swallowing, tooth/dental problems, or sore throat. No sneezing, itching, ear ache, nasal congestion, or post nasal drip CV:  No chest pain, orthopnea, PND, swelling in lower extremities, anasarca, palpitations, dizziness, syncope Resp: +shortness of breath with exertion; occasional wheeze. No productive cough.  No hemoptysis. No chest wall deformity GI:  No  heartburn, indigestion, abdominal pain, nausea, vomiting, diarrhea, change in bowel habits, loss of appetite, bloody stools.  GU: No dysuria, change in color of urine, urgency or frequency.   Skin: No rash, lesions, ulcerations MSK:  No increased joint pain or swelling.   Neuro: No gait abnormalities.  Psych: No anxiety. +depression. No SI/HI. Mood stable.     Physical Exam:  BP 120/60   Pulse 85   Temp 97.8 F (36.6 C) (Oral)   Ht 5\' 9"  (1.753 m)   Wt 222 lb  6.4 oz (100.9 kg)   SpO2 97%   BMI 32.84 kg/m   GEN: Pleasant, interactive, chronically-ill appearing; obese; in no acute distress. HEENT:  Normocephalic and atraumatic. PERRLA. Sclera white. Nasal turbinates pink, moist and patent bilaterally. No rhinorrhea present. Oropharynx pink and moist, without exudate or edema. No lesions, ulcerations, or postnasal drip.  NECK:  Supple w/ fair ROM. No JVD present. Normal carotid impulses w/o bruits. Thyroid symmetrical with no goiter or nodules palpated. No lymphadenopathy.   CV: RRR, no m/r/g, no peripheral edema. Pulses intact, +2 bilaterally. No cyanosis, pallor or clubbing. PULMONARY:  Unlabored, regular breathing. Scattered rhonchi b/l; resolved expiratory wheeze with neb treatment. No accessory muscle use.  GI: BS present and normoactive. Soft, non-tender to palpation. Protuberant. No organomegaly or masses detected.  MSK: No erythema, warmth or tenderness. Cap refil <2 sec all extrem. No deformities or joint swelling noted.  Neuro: A/Ox3. No focal deficits noted.   Skin: Warm, no lesions or rashe Psych: Normal affect and behavior. Judgement and thought content appropriate.     Lab Results:  CBC    Component Value Date/Time   WBC 7.8 05/24/2022 1842   RBC 5.31 05/24/2022 1842   HGB 14.3 05/24/2022 1842   HCT 43.8 05/24/2022 1842   PLT 221 05/24/2022 1842   MCV 82.5 05/24/2022 1842   MCH 26.9 05/24/2022 1842   MCHC 32.6 05/24/2022 1842   RDW 13.8 05/24/2022 1842   LYMPHSABS 1.5 05/24/2022 1842   MONOABS 0.5 05/24/2022 1842   EOSABS 0.1 05/24/2022 1842   BASOSABS 0.0 05/24/2022 1842    BMET    Component Value Date/Time   NA 136 09/25/2022 1412   K 4.2 09/25/2022 1412   CL 97 09/25/2022 1412   CO2 28 09/25/2022 1412   GLUCOSE 123 (H) 09/25/2022 1412   BUN 53 (H) 09/25/2022 1412   CREATININE 2.05 (H) 09/25/2022 1412   CREATININE 1.89 (H) 04/24/2021 1524   CALCIUM 9.3 09/25/2022 1412   GFRNONAA >60 05/24/2022 1842   GFRAA  42 (L) 04/12/2020 1348    BNP    Component Value Date/Time   BNP 84.2 04/12/2020 1348     Imaging:  No results found.        Latest Ref Rng & Units 09/04/2019   11:01 AM 09/08/2018   10:21 AM 09/07/2014   11:39 AM 04/13/2014   11:43 AM  PFT Results  FVC-Pre L 3.47  3.80  4.03  4.37   FVC-Predicted Pre % 74  81  83  90   FVC-Post L   4.29  4.43   FVC-Predicted Post %   89  92   Pre FEV1/FVC % % 77  76  81  82   Post FEV1/FCV % %   83  83   FEV1-Pre L 2.68  2.88  3.28  3.58   FEV1-Predicted Pre % 77  82  90  97   FEV1-Post L   3.58  3.67   DLCO uncorrected ml/min/mmHg 14.15  13.84  16.41  17.92   DLCO UNC% % 52  51  50  55   DLVA Predicted % 71  62  57  63   TLC L   6.36  6.11   TLC % Predicted %   90  87   RV % Predicted %   85  76     No results found for: "NITRICOXIDE"      Assessment & Plan:   COPD with emphysema ([name]) Poorly controlled due to stopping maintenance regimen. He was re-educated on medications and proper use. Verbalized understanding. He had resolution in bronchospasm with duoneb today so will hold off on further steroids given his DM and unhealing wound that he is already being treated for. No infectious respiratory symptoms and already on doxycycline. Close follow up. Action plan in place.  Patient Instructions  Restart your nebulizer treatments.... -Budesonide neb 2 mL Twice daily. Brush tongue and rinse mouth afterwards -Formoterol (performist) 2 mL Twice daily. Can use with budesonide -Revefenacin (Yupelri) 3 mL once daily. Do this one separately  Make sure you don't stop your treatments because that if what helps control your COPD on a daily basis. Without them, you risk having a flare or going to the hospital   Continue Mucinex DM over the counter Twice daily for cough/congestion Continue Benzonatate 1 capsule Three times a day for cough  Continue Albuterol inhaler 2 puffs or 3 mL neb every 6 hours as needed for shortness of breath or  wheezing. Notify if symptoms persist despite rescue inhaler/neb use.  Continue nexium 1 tab daily Continue supplemental oxygen 5-6 lpm for goal >88-90%.    Follow up in 4 weeks with Dr. Marchelle Gearing (1st) or Katie Zidan Helget,NP. If symptoms do not improve or worsen, please contact office for sooner follow up or seek emergency care.   Chronic respiratory failure with hypoxia ([name]) Stable without increased O2 requirement. Goal >88-90%   I spent 35 minutes of dedicated to the care of this patient on the date of this encounter to include pre-visit review of records, face-to-face time with the patient discussing conditions above, post visit ordering of testing, clinical documentation with the electronic health record, making appropriate referrals as documented, and communicating necessary findings to members of the patients care team.  Noemi Chapel, NP 12/10/2022  Pt aware and understands NP's role.

## 2022-12-10 NOTE — Patient Instructions (Addendum)
Restart your nebulizer treatments.... -Budesonide neb 2 mL Twice daily. Brush tongue and rinse mouth afterwards -Formoterol (performist) 2 mL Twice daily. Can use with budesonide -Revefenacin (Yupelri) 3 mL once daily. Do this one separately  Make sure you don't stop your treatments because that if what helps control your COPD on a daily basis. Without them, you risk having a flare or going to the hospital   Continue Mucinex DM over the counter Twice daily for cough/congestion Continue Benzonatate 1 capsule Three times a day for cough  Continue Albuterol inhaler 2 puffs or 3 mL neb every 6 hours as needed for shortness of breath or wheezing. Notify if symptoms persist despite rescue inhaler/neb use.  Continue nexium 1 tab daily Continue supplemental oxygen 5-6 lpm for goal >88-90%.    Follow up in 4 weeks with Dr. Marchelle Gearing (1st) or Katie Aashka Salomone,NP. If symptoms do not improve or worsen, please contact office for sooner follow up or seek emergency care.

## 2022-12-22 ENCOUNTER — Other Ambulatory Visit: Payer: Self-pay

## 2022-12-22 ENCOUNTER — Encounter (HOSPITAL_COMMUNITY): Payer: Self-pay

## 2022-12-22 ENCOUNTER — Observation Stay (HOSPITAL_COMMUNITY)
Admission: EM | Admit: 2022-12-22 | Discharge: 2022-12-24 | Disposition: A | Discharge status: Home Health | Payer: 59 | Attending: Family Medicine | Admitting: Family Medicine

## 2022-12-22 ENCOUNTER — Emergency Department (HOSPITAL_COMMUNITY): Payer: 59

## 2022-12-22 DIAGNOSIS — R110 Nausea: Secondary | ICD-10-CM | POA: Insufficient documentation

## 2022-12-22 DIAGNOSIS — E1122 Type 2 diabetes mellitus with diabetic chronic kidney disease: Secondary | ICD-10-CM | POA: Insufficient documentation

## 2022-12-22 DIAGNOSIS — Z8673 Personal history of transient ischemic attack (TIA), and cerebral infarction without residual deficits: Secondary | ICD-10-CM | POA: Insufficient documentation

## 2022-12-22 DIAGNOSIS — R197 Diarrhea, unspecified: Secondary | ICD-10-CM | POA: Diagnosis not present

## 2022-12-22 DIAGNOSIS — F17210 Nicotine dependence, cigarettes, uncomplicated: Secondary | ICD-10-CM | POA: Diagnosis not present

## 2022-12-22 DIAGNOSIS — J449 Chronic obstructive pulmonary disease, unspecified: Secondary | ICD-10-CM | POA: Diagnosis not present

## 2022-12-22 DIAGNOSIS — R531 Weakness: Principal | ICD-10-CM

## 2022-12-22 DIAGNOSIS — I10 Essential (primary) hypertension: Secondary | ICD-10-CM | POA: Diagnosis present

## 2022-12-22 DIAGNOSIS — I5032 Chronic diastolic (congestive) heart failure: Secondary | ICD-10-CM | POA: Insufficient documentation

## 2022-12-22 DIAGNOSIS — I130 Hypertensive heart and chronic kidney disease with heart failure and stage 1 through stage 4 chronic kidney disease, or unspecified chronic kidney disease: Secondary | ICD-10-CM | POA: Diagnosis not present

## 2022-12-22 DIAGNOSIS — Z7902 Long term (current) use of antithrombotics/antiplatelets: Secondary | ICD-10-CM | POA: Insufficient documentation

## 2022-12-22 DIAGNOSIS — K219 Gastro-esophageal reflux disease without esophagitis: Secondary | ICD-10-CM | POA: Diagnosis not present

## 2022-12-22 DIAGNOSIS — Z794 Long term (current) use of insulin: Secondary | ICD-10-CM | POA: Diagnosis not present

## 2022-12-22 DIAGNOSIS — E876 Hypokalemia: Secondary | ICD-10-CM | POA: Diagnosis not present

## 2022-12-22 DIAGNOSIS — I951 Orthostatic hypotension: Secondary | ICD-10-CM | POA: Insufficient documentation

## 2022-12-22 DIAGNOSIS — Z955 Presence of coronary angioplasty implant and graft: Secondary | ICD-10-CM | POA: Insufficient documentation

## 2022-12-22 DIAGNOSIS — Z7984 Long term (current) use of oral hypoglycemic drugs: Secondary | ICD-10-CM | POA: Insufficient documentation

## 2022-12-22 DIAGNOSIS — Z743 Need for continuous supervision: Secondary | ICD-10-CM | POA: Diagnosis not present

## 2022-12-22 DIAGNOSIS — Z79899 Other long term (current) drug therapy: Secondary | ICD-10-CM | POA: Diagnosis not present

## 2022-12-22 DIAGNOSIS — E1165 Type 2 diabetes mellitus with hyperglycemia: Secondary | ICD-10-CM | POA: Diagnosis present

## 2022-12-22 DIAGNOSIS — Z905 Acquired absence of kidney: Secondary | ICD-10-CM | POA: Diagnosis not present

## 2022-12-22 DIAGNOSIS — E782 Mixed hyperlipidemia: Secondary | ICD-10-CM | POA: Diagnosis not present

## 2022-12-22 DIAGNOSIS — Z85528 Personal history of other malignant neoplasm of kidney: Secondary | ICD-10-CM | POA: Insufficient documentation

## 2022-12-22 DIAGNOSIS — Z89422 Acquired absence of other left toe(s): Secondary | ICD-10-CM | POA: Diagnosis not present

## 2022-12-22 DIAGNOSIS — I2510 Atherosclerotic heart disease of native coronary artery without angina pectoris: Secondary | ICD-10-CM | POA: Diagnosis present

## 2022-12-22 DIAGNOSIS — J9611 Chronic respiratory failure with hypoxia: Secondary | ICD-10-CM | POA: Diagnosis present

## 2022-12-22 DIAGNOSIS — Z6838 Body mass index (BMI) 38.0-38.9, adult: Secondary | ICD-10-CM

## 2022-12-22 DIAGNOSIS — R5381 Other malaise: Secondary | ICD-10-CM | POA: Diagnosis not present

## 2022-12-22 DIAGNOSIS — E669 Obesity, unspecified: Secondary | ICD-10-CM

## 2022-12-22 DIAGNOSIS — N189 Chronic kidney disease, unspecified: Secondary | ICD-10-CM | POA: Diagnosis not present

## 2022-12-22 LAB — COMPREHENSIVE METABOLIC PANEL
ALT: 10 U/L (ref 0–44)
AST: 18 U/L (ref 15–41)
Albumin: 3.5 g/dL (ref 3.5–5.0)
Alkaline Phosphatase: 72 U/L (ref 38–126)
Anion gap: 11 (ref 5–15)
BUN: 12 mg/dL (ref 8–23)
CO2: 23 mmol/L (ref 22–32)
Calcium: 8.7 mg/dL — ABNORMAL LOW (ref 8.9–10.3)
Chloride: 99 mmol/L (ref 98–111)
Creatinine, Ser: 1.24 mg/dL (ref 0.61–1.24)
GFR, Estimated: >60 mL/min (ref >60)
Glucose, Bld: 189 mg/dL — ABNORMAL HIGH (ref 70–99)
Potassium: 3.5 mmol/L (ref 3.5–5.1)
Sodium: 133 mmol/L — ABNORMAL LOW (ref 135–145)
Total Bilirubin: 1.2 mg/dL (ref 0.3–1.2)
Total Protein: 7.3 g/dL (ref 6.5–8.1)

## 2022-12-22 LAB — CBC WITH DIFFERENTIAL/PLATELET
Abs Immature Granulocytes: 0.05 10*3/uL (ref 0.00–0.07)
Basophils Absolute: 0 10*3/uL (ref 0.0–0.1)
Basophils Relative: 1 %
Eosinophils Absolute: 0.1 10*3/uL (ref 0.0–0.5)
Eosinophils Relative: 1 %
HCT: 48.1 % (ref 39.0–52.0)
Hemoglobin: 15.7 g/dL (ref 13.0–17.0)
Immature Granulocytes: 1 %
Lymphocytes Relative: 16 %
Lymphs Abs: 1.2 10*3/uL (ref 0.7–4.0)
MCH: 27 pg (ref 26.0–34.0)
MCHC: 32.6 g/dL (ref 30.0–36.0)
MCV: 82.8 fL (ref 80.0–100.0)
Monocytes Absolute: 0.4 10*3/uL (ref 0.1–1.0)
Monocytes Relative: 5 %
Neutro Abs: 5.9 10*3/uL (ref 1.7–7.7)
Neutrophils Relative %: 76 %
Platelets: 157 10*3/uL (ref 150–400)
RBC: 5.81 MIL/uL (ref 4.22–5.81)
RDW: 13.6 % (ref 11.5–15.5)
WBC: 7.6 10*3/uL (ref 4.0–10.5)
nRBC: 0 % (ref 0.0–0.2)

## 2022-12-22 LAB — URINALYSIS, ROUTINE W REFLEX MICROSCOPIC
Bilirubin Urine: NEGATIVE
Glucose, UA: 50 mg/dL — AB
Hgb urine dipstick: NEGATIVE
Ketones, ur: NEGATIVE mg/dL
Leukocytes,Ua: NEGATIVE
Nitrite: NEGATIVE
Protein, ur: >=300 mg/dL — AB
Specific Gravity, Urine: 1.022 (ref 1.005–1.030)
pH: 6 (ref 5.0–8.0)

## 2022-12-22 LAB — LIPASE, BLOOD: Lipase: 36 U/L (ref 11–51)

## 2022-12-22 MED ORDER — ONDANSETRON 4 MG PO TBDP
4.0000 mg | ORAL_TABLET | Freq: Once | ORAL | Status: AC
  Administered 2022-12-22: 4 mg via ORAL
  Filled 2022-12-22: qty 1

## 2022-12-22 MED ORDER — ONDANSETRON HCL 4 MG/2ML IJ SOLN
4.0000 mg | Freq: Four times a day (QID) | INTRAMUSCULAR | Status: DC | PRN
  Administered 2022-12-22 – 2022-12-23 (×2): 4 mg via INTRAVENOUS
  Filled 2022-12-22 (×2): qty 2

## 2022-12-22 MED ORDER — ONDANSETRON HCL 4 MG PO TABS: 4.0000 mg | ORAL_TABLET | Freq: Four times a day (QID) | ORAL | Status: DC | PRN

## 2022-12-22 MED ORDER — ACETAMINOPHEN 650 MG RE SUPP: 650.0000 mg | Freq: Four times a day (QID) | RECTAL | Status: DC | PRN

## 2022-12-22 MED ORDER — SODIUM CHLORIDE 0.9 % IV BOLUS
500.0000 mL | Freq: Once | INTRAVENOUS | Status: AC
  Administered 2022-12-22: 500 mL via INTRAVENOUS

## 2022-12-22 MED ORDER — ACETAMINOPHEN 325 MG PO TABS: 650.0000 mg | ORAL_TABLET | Freq: Four times a day (QID) | ORAL | Status: DC | PRN

## 2022-12-22 MED ORDER — ENOXAPARIN SODIUM 40 MG/0.4ML IJ SOSY
40.0000 mg | PREFILLED_SYRINGE | INTRAMUSCULAR | Status: DC
  Administered 2022-12-22 – 2022-12-23 (×2): 40 mg via SUBCUTANEOUS
  Filled 2022-12-22 (×2): qty 0.4

## 2022-12-22 NOTE — ED Provider Notes (Signed)
[place] EMERGENCY DEPARTMENT AT [facility] Provider Note   CSN: [id number] Arrival date & time: 12/22/22  1219     History  Chief Complaint  Patient presents with   Weakness    [name] is a 67 y.o. male.  PMH of emphysema, chronic respiratory failure on supplemental oxygen, CAD, carotid artery disease, diastolic CHF, CKD, anxiety, diabetes.  Comes to ER today complaining of generalized weakness, nausea and diarrhea up to 4 times a day for the past week.  This started after finishing course of doxycycline for left foot infection which is since resolved.  Denies any vomiting, black or bloody stools.  No fevers or chills, no abdominal pain, no urinary symptoms.   Weakness      Home Medications Prior to Admission medications   Medication Sig Start Date End Date Taking? Authorizing Provider  acetaminophen (TYLENOL) 325 MG tablet Take 650 mg by mouth See admin instructions. Take 650 mg every 3-4 hours as needed for pain. 06/03/18  Yes [provider]  albuterol (PROVENTIL) (2.5 MG/3ML) 0.083% nebulizer solution Take 3 mLs (2.5 mg total) by nebulization every 6 (six) hours as needed for wheezing or shortness of breath. 09/04/22  Yes Cobb, Ruby Cola, NP  ALPRAZolam Prudy Feeler) 1 MG tablet Take 1 mg by mouth 3 (three) times daily as needed. 01/07/21  Yes [provider]  atorvastatin (LIPITOR) 20 MG tablet Take 20 mg by mouth at bedtime. 04/26/18  Yes [provider]  benzonatate (TESSALON) 200 MG capsule TAKE ONE CAPSULE BY MOUTH THREE TIMES DAILY AS NEEDED FOR COUGH Patient taking differently: Take 200 mg by mouth 3 (three) times daily as needed for cough. 09/29/22  Yes Kalman Shan, MD  budesonide (PULMICORT) 1 MG/2ML nebulizer solution Take 2 mLs (1 mg total) by nebulization in the morning and at bedtime. 10/06/22  Yes Cobb, Ruby Cola, NP  clopidogrel (PLAVIX) 75 MG tablet TAKE ONE TABLET BY MOUTH EVERY DAY 05/27/22  Yes Jonelle Sidle, MD   diltiazem (CARDIZEM CD) 180 MG 24 hr capsule TAKE ONE CAPSULE BY MOUTH EVERY DAY 10/21/22  Yes Jonelle Sidle, MD  diphenhydrAMINE (BENADRYL) 25 mg capsule Take 50 mg by mouth at bedtime.   Yes [provider]  Docusate Sodium (DSS) 100 MG CAPS Take 1 capsule by mouth as needed (constipation). 04/08/20  Yes [provider]  doxycycline (VIBRA-TABS) 100 MG tablet Take 1 tablet (100 mg total) by mouth 2 (two) times daily. 12/09/22  Yes Candelaria Stagers, DPM  esomeprazole (NEXIUM) 20 MG capsule Take 20 mg by mouth daily at 12 noon.   Yes [provider]  FLUoxetine (PROZAC) 40 MG capsule Take 40 mg by mouth daily.   Yes [provider]  formoterol (PERFOROMIST) 20 MCG/2ML nebulizer solution Take 2 mLs (20 mcg total) by nebulization 2 (two) times daily. 09/16/22  Yes Cobb, Ruby Cola, NP  gabapentin (NEURONTIN) 300 MG capsule Take 900 mg by mouth 3 (three) times daily.   Yes [provider]  insulin regular human CONCENTRATED (HUMULIN R) 500 UNIT/ML injection Inject 90 Units into the skin 3 (three) times daily with meals.   Yes [provider]  isosorbide mononitrate (IMDUR) 60 MG 24 hr tablet Take 1 tablet (60 mg total) by mouth in the morning. & 30 mg in the evening 03/27/22  Yes Jonelle Sidle, MD  linagliptin (TRADJENTA) 5 MG TABS tablet Take 5 mg by mouth daily.   Yes [provider]  midodrine (  PROAMATINE) 2.5 MG tablet Take 1 tablet (2.5 mg total) by mouth 2 (two) times daily. 08/11/22  Yes Jonelle Sidle, MD  oxyCODONE-acetaminophen (PERCOCET) 10-325 MG tablet Take 1 tablet by mouth daily as needed for pain. 09/25/21  Yes [provider]  potassium chloride SA (K-DUR,KLOR-CON) 20 MEQ tablet Take 20 mEq by mouth 2 (two) times daily.    Yes [provider]  pregabalin (LYRICA) 25 MG capsule Take 25 mg by mouth 2 (two) times daily. 02/14/19  Yes [provider]  ranolazine (RANEXA) 500 MG 12 hr tablet Take 1  tablet (500 mg total) by mouth 2 (two) times daily. 08/27/22  Yes Sharlene Dory, NP  revefenacin (YUPELRI) 175 MCG/3ML nebulizer solution Take 3 mLs (175 mcg total) by nebulization daily. 09/16/22  Yes Cobb, Ruby Cola, NP  tamsulosin (FLOMAX) 0.4 MG CAPS capsule Take 0.4 mg by mouth daily.    Yes [provider]  torsemide (DEMADEX) 20 MG tablet TAKE THREE TABLETS BY MOUTH TWICE DAILY Patient taking differently: Take 60 mg by mouth 2 (two) times daily. 06/26/21  Yes Jonelle Sidle, MD  zolpidem (AMBIEN) 5 MG tablet Take 5 mg by mouth at bedtime. For insomnia. 03/03/19  Yes [provider]  glucose blood (TRUE METRIX BLOOD GLUCOSE TEST) test strip     [provider]  nitroGLYCERIN (NITROSTAT) 0.4 MG SL tablet Place 1 tablet (0.4 mg total) under the tongue every 5 (five) minutes x 3 doses as needed for chest pain (if no relief after 2nd dose, proceed to ED or call 911). Patient not taking: Reported on 12/22/2022 06/26/22   Jonelle Sidle, MD  NOVOLOG FLEXPEN 100 UNIT/ML FlexPen 30 units 5-10 minutes before meals Subcutaneous three times a day for 30 days    [provider]      Allergies    Lisinopril, Pollen extract, and Oxycodone    Review of Systems   Review of Systems  Neurological:  Positive for weakness.    Physical Exam Updated Vital Signs BP (!) 142/65   Pulse 71   Temp 98.1 F (36.7 C) (Oral)   Resp 19   Ht 5\' 9"  (1.753 m)   Wt 100.9 kg   SpO2 97%   BMI 32.84 kg/m  Physical Exam Vitals and nursing note reviewed.  Constitutional:      General: [name] is not in acute distress.    Appearance: [name] is well-developed.  HENT:     Head: Normocephalic and atraumatic.     Mouth/Throat:     Mouth: Mucous membranes are moist.  Eyes:     Conjunctiva/sclera: Conjunctivae normal.  Cardiovascular:     Rate and Rhythm: Normal rate and regular rhythm.     Heart sounds: No murmur heard. Pulmonary:     Effort: Pulmonary effort is normal. No  respiratory distress.     Breath sounds: Normal breath sounds.  Abdominal:     Palpations: Abdomen is soft.     Tenderness: There is no abdominal tenderness.  Musculoskeletal:        General: No swelling.     Cervical back: Neck supple.     Right lower leg: No edema.     Left lower leg: No edema.     Comments: Small abrasion to the dorsum of the left foot with no surrounding cellulitis or crepitus  Skin:    General: Skin is warm and dry.     Capillary Refill: Capillary refill takes less than 2 seconds.  Neurological:  General: No focal deficit present.     Mental Status: [name] is alert and oriented to person, place, and time.  Psychiatric:        Mood and Affect: Mood normal.     ED Results / Procedures / Treatments   Labs (all labs ordered are listed, but only abnormal results are displayed) Labs Reviewed  COMPREHENSIVE METABOLIC PANEL - Abnormal; Notable for the following components:      Result Value   Sodium 133 (*)    Glucose, Bld 189 (*)    Calcium 8.7 (*)    All other components within normal limits  URINALYSIS, ROUTINE W REFLEX MICROSCOPIC - Abnormal; Notable for the following components:   Glucose, UA 50 (*)    Protein, ur >=300 (*)    Bacteria, UA RARE (*)    All other components within normal limits  C DIFFICILE QUICK SCREEN W PCR REFLEX    GASTROINTESTINAL PANEL BY PCR, STOOL (REPLACES STOOL CULTURE)  LIPASE, BLOOD  CBC WITH DIFFERENTIAL/PLATELET  CBC WITH DIFFERENTIAL/PLATELET    EKG None  Radiology DG Chest Portable 1 View  Result Date: 12/22/2022 CLINICAL DATA:  [facility] EXAM: PORTABLE CHEST 1 VIEW COMPARISON:  09/04/2022.  05/24/2022. FINDINGS: Pleuroparenchymal scarring at the left base is stable since prior and comparing back to 05/24/2022. Right lung clear. Cardiopericardial silhouette is at upper limits of normal for size. Bones are diffusely demineralized. Telemetry leads overlie the chest. IMPRESSION: Chronic pleuroparenchymal scarring at the left  base. No acute cardiopulmonary findings. Electronically Signed   By: Kennith Center M.D.   On: 12/22/2022 14:44    Procedures Procedures    Medications Ordered in ED Medications  ondansetron (ZOFRAN-ODT) disintegrating tablet 4 mg (4 mg Oral Given 12/22/22 1409)  sodium chloride 0.9 % bolus 500 mL (0 mLs Intravenous Stopped 12/22/22 1708)  sodium chloride 0.9 % bolus 500 mL (0 mLs Intravenous Stopped 12/22/22 2135)    ED Course/ Medical Decision Making/ A&P Clinical Course as of 12/22/22 2153  Tue Dec 22, 2022  1828 Was having some nausea and diarrhea for the past several days and states [name] feels somewhat weak, given Zofran and fluids here, states [name] feels about the same, labs are all reassuring, vital signs are also reassuring, [name] is having no chest pain or shortness of breath, no dizziness or syncope.  Discussed with patient no indication for admission.  [name] was not able to give a stool sample as [name] took Imodium prior to coming in the seem to stop his diarrhea.  Plan to ambulate patient and if [name] tolerates ambulation we will discharge him home to follow-up with his PCP and GI. [CB]    Clinical Course User Index [CB] Ma Rings, PA-C                             Medical Decision Making This patient presents to the ED for concern of weakness for the past week, nausea and diarrhea x 7 days, this involves an extensive number of treatment options, and is a complaint that carries with it a high risk of complications and morbidity.  The differential diagnosis includes hydration, electrolyte abnormality, orthostatic hypotension, sepsis, pneumonia, UTI, other   Co morbidities that complicate the patient evaluation :   COPD on 6 L oxygen, CHF, diabetes   Additional history obtained:  Additional history obtained from EMR External records from outside source obtained and reviewed including prior notes and labs  Lab Tests:  I Ordered, and personally interpreted labs.  The pertinent results  include: BC is normal, lipase is normal, CMP has mild hyponatremia, mild hyperglycemia   Imaging Studies ordered:  I ordered imaging studies including CXR I independently visualized and interpreted imaging which showed no pulmonary edema or infiltrate I agree with the radiologist interpretation     Consultations Obtained:  I requested consultation with the hospitalist,  and discussed lab and imaging findings as well as pertinent plan - they recommend: For nausea diarrhea and generalized weakness with orthostatic hypotension plans to continue to hydrate for likely dehydration.   Problem List / ED Course / Critical interventions / Medication management  Here for generalized weakness and diarrhea x 1 week, took Imodium today not able to have bowel movement in the ED, labs are overall reassuring but do have some mild hyponatremia, will give him some gentle fluids and [name] was still hypotensive on standing that [name] is able to ambulate but got dizzy with ambulation.  Give him another half liter of NS, avoiding overhydration due to his history of CHF.  On repeat evaluation after this [name] was still orthostatic and required nurse to hold him up when [name] stood.  For admission send continue hydration I ordered medication including normal saline for dehydration, dizziness Reevaluation of the patient after these medicines showed that the patient worsened I have reviewed the patients home medicines and have made adjustments as needed        Amount and/or Complexity of Data Reviewed Labs: ordered. Radiology: ordered.  Risk Prescription drug management. Decision regarding hospitalization.           Final Clinical Impression(s) / ED Diagnoses Final diagnoses:  Orthostatic hypotension  Weakness    Rx / DC Orders ED Discharge Orders     None         Josem Kaufmann 12/22/22 2153    Bethann Berkshire, MD 12/24/22 1039

## 2022-12-22 NOTE — ED Notes (Signed)
Ambulated pt around desk x 1 lap  Pt stated he started to get light headed just before getting back to room  Pt steady gait throughout ambulation

## 2022-12-22 NOTE — ED Triage Notes (Signed)
Patient complains of generalized weakness over the last few days with some nausea and diarrhea

## 2022-12-22 NOTE — H&P (Signed)
History and Physical    Patient: [name]:[id number] DOB: July 16, 1956 DOA: 12/22/2022 DOS: the patient was seen and examined on 12/23/2022 PCP: [name], [name], FNP  Patient coming from: Home  Chief Complaint:  Chief Complaint  Patient presents with   Weakness   HPI: [name] is a 67 y.o. male with medical history significant of chronic respiratory failure with hypoxemia on supplemental oxygen at 6LPM, COPD, type 2 diabetes mellitus with diabetic neuropathy, CAD s/p stent placement, essential hypertension, GERD, diastolic CHF hyperlipidemia, sleep apnea not on CPAP who presents to the emergency department due to 1 week onset of nausea, diarrhea, generalized weakness.  Diarrhea was up to 4 episodes of loose bowel movement daily since onset a week ago.  Diarrhea started after completing a course of doxycycline for left foot infection which is currently resolved.  He denies fever, chills, vomiting, abdominal pain, chest pain or shortness of breath  ED Course:  In the emergency department, respiratory rate was 24/min on arrival to the ED, other vital signs are within normal range.  Workup in the ED showed normal CBC and BMP except for sodium of 133 and blood glucose of 189.  Urinalysis was positive for proteinuria, lipase was 36. Chest x-ray showed chronic pleural-parenchymal scarring at the left base.  No acute cardiopulmonary findings. Patient was treated with Zofran, IV hydration was provided. Hospitalist was asked to admit patient for further evaluation and management.  Review of Systems: Review of systems as noted in the HPI. All other systems reviewed and are negative.   Past Medical History:  Diagnosis Date   Anxiety    Arthritis    Cholecystitis, acute 12/20/2013   Lap chole 6/5   Cocaine abuse ([name])    Community acquired pneumonia 05/31/2018   COPD (chronic obstructive pulmonary disease) ([name])    Coronary atherosclerosis of native coronary artery    a. 03/09/2013  Cath/PCI: LM nl, LAD: 50p, 72m (2.5x16 promus DES), LCX nl, OM1 min irregs, LPL/LPDA diff dzs, RCA nondom, mod diff dzs, EF 55%.   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 ([name]) 04/24/2021   History of nephrolithiasis    History of pneumonia    History of stroke    Right MCA distribution, residual left-sided weakness   Hyperlipidemia    Metabolic encephalopathy    Noncompliance    Osteomyelitis of second toe of left foot ([name]) 04/24/2021   Renal cell carcinoma ([name])    Status post radical right nephrectomy August 2015   Respiratory failure with hypoxia ([name])    Sepsis ([name]) 05/19/2019   Sleep apnea    On CPAP, 4L O2 no cpap at home yet   Type 2 diabetes mellitus ([name]) 2011   Past Surgical History:  Procedure Laterality Date   ABDOMINAL AORTOGRAM W/LOWER EXTREMITY Left 07/17/2021   Procedure: ABDOMINAL AORTOGRAM W/LOWER EXTREMITY;  Surgeon: Nada Libman, MD;  Location: [facility];  Service: Cardiovascular;  Laterality: Left;   AMPUTATION Left 12/27/2020   Procedure: PARTIAL FIRST RAY AMPUTATION OF FOOT;  Surgeon: Candelaria Stagers, DPM;  Location: [facility] OR;  Service: Podiatry;  Laterality: Left;   APPENDECTOMY  1970's   CHOLECYSTECTOMY N/A 12/22/2013   Procedure: LAPAROSCOPIC CHOLECYSTECTOMY ;  Surgeon: Shelly Rubenstein, MD;  Location: [facility] OR;  Service: General;  Laterality: N/A;  ENDARTERECTOMY Right 10/08/2014   Procedure: Right ENDARTERECTOMY CAROTID;  Surgeon: Larina Earthly, MD;  Location: [facility] OR;  Service: Vascular;  Laterality: Right;   LAPAROSCOPIC LYSIS OF ADHESIONS  02/21/2014   Procedure: LAPAROSCOPIC LYSIS OF ADHESIONS EXTINSIVE;  Surgeon: Sebastian Ache, MD;  Location: [facility] ORS;  Service: Urology;;   LEFT HEART CATHETERIZATION WITH CORONARY ANGIOGRAM N/A 06/02/2012   Procedure: LEFT HEART CATHETERIZATION WITH CORONARY ANGIOGRAM;   Surgeon: Herby Abraham, MD;  Location: [facility] CATH LAB;  Service: Cardiovascular;  Laterality: N/A;   LEFT HEART CATHETERIZATION WITH CORONARY ANGIOGRAM N/A 03/09/2013   Procedure: LEFT HEART CATHETERIZATION WITH CORONARY ANGIOGRAM;  Surgeon: Tonny Bollman, MD;  Location: [facility] CATH LAB;  Service: Cardiovascular;  Laterality: N/A;   LEFT HEART CATHETERIZATION WITH CORONARY ANGIOGRAM N/A 12/20/2013   Procedure: LEFT HEART CATHETERIZATION WITH CORONARY ANGIOGRAM;  Surgeon: Kathleene Hazel, MD;  Location: [facility] CATH LAB;  Service: Cardiovascular;  Laterality: N/A;   LUNG BIOPSY Left 05/18/2014   Procedure: LUNG BIOPSY left upper lobe & left lower lobe;  Surgeon: Loreli Slot, MD;  Location: [facility] OR;  Service: Thoracic;  Laterality: Left;   PATCH ANGIOPLASTY Right 10/08/2014   Procedure: PATCH ANGIOPLASTY Right Carotid;  Surgeon: Larina Earthly, MD;  Location: [facility] OR;  Service: Vascular;  Laterality: Right;   PERIPHERAL VASCULAR INTERVENTION Left 07/17/2021   Procedure: PERIPHERAL VASCULAR INTERVENTION;  Surgeon: Nada Libman, MD;  Location: [facility];  Service: Cardiovascular;  Laterality: Left;  Superficial Femoral Artery   ROBOT ASSISTED LAPAROSCOPIC NEPHRECTOMY Right 02/21/2014   Procedure: ROBOTIC ASSISTED LAPAROSCOPIC RIGHT NEPHRECTOMY ;  Surgeon: Sebastian Ache, MD;  Location: [facility] ORS;  Service: Urology;  Laterality: Right;   VIDEO ASSISTED THORACOSCOPY Left 05/18/2014   Procedure: LEFT VIDEO ASSISTED THORACOSCOPY;  Surgeon: Loreli Slot, MD;  Location: [facility] OR;  Service: Thoracic;  Laterality: Left;   VIDEO BRONCHOSCOPY  05/18/2014   Procedure: VIDEO BRONCHOSCOPY;  Surgeon: Loreli Slot, MD;  Location: [facility] OR;  Service: Thoracic;;    Social History:  reports that he has been smoking cigarettes. He started smoking about 50 years ago. He has a 41.00 pack-year smoking history. He has never been exposed to tobacco smoke. He has never used smokeless tobacco. He reports that  he does not drink alcohol and does not use drugs.   Allergies  Allergen Reactions   Lisinopril Other (See Comments)    Other reaction(s): worsening kidney function   Pollen Extract     Other reaction(s): sneezing   Oxycodone Itching and Nausea Only    Pt is able to tolerate if taken with benadryl    Family History  Problem Relation Age of Onset   Cancer Mother        Thyroid - living in her 6's.   Hypertension Other    Diabetes Other    Stroke Other    Lung cancer Father    CAD Father    Cancer Maternal Grandmother        Breast   Cancer Maternal Grandfather        Throat and stomach   CAD Brother      Prior to Admission medications   Medication Sig Start Date End Date Taking? Authorizing Provider  acetaminophen (TYLENOL) 325 MG tablet Take 650 mg by mouth See admin instructions. Take 650 mg every 3-4 hours as needed for pain. 06/03/18  Yes [provider]  albuterol (PROVENTIL) (2.5 MG/3ML) 0.083% nebulizer solution Take 3 mLs (2.5 mg total)  by nebulization every 6 (six) hours as needed for wheezing or shortness of breath. 09/04/22  Yes Cobb, Ruby Cola, NP  ALPRAZolam Prudy Feeler) 1 MG tablet Take 1 mg by mouth 3 (three) times daily as needed. 01/07/21  Yes [provider]  atorvastatin (LIPITOR) 20 MG tablet Take 20 mg by mouth at bedtime. 04/26/18  Yes [provider]  benzonatate (TESSALON) 200 MG capsule TAKE ONE CAPSULE BY MOUTH THREE TIMES DAILY AS NEEDED FOR COUGH Patient taking differently: Take 200 mg by mouth 3 (three) times daily as needed for cough. 09/29/22  Yes Kalman Shan, MD  budesonide (PULMICORT) 1 MG/2ML nebulizer solution Take 2 mLs (1 mg total) by nebulization in the morning and at bedtime. 10/06/22  Yes Cobb, Ruby Cola, NP  clopidogrel (PLAVIX) 75 MG tablet TAKE ONE TABLET BY MOUTH EVERY DAY 05/27/22  Yes Jonelle Sidle, MD  diltiazem (CARDIZEM CD) 180 MG 24 hr capsule TAKE ONE CAPSULE BY MOUTH EVERY DAY 10/21/22  Yes  Jonelle Sidle, MD  diphenhydrAMINE (BENADRYL) 25 mg capsule Take 50 mg by mouth at bedtime.   Yes [provider]  Docusate Sodium (DSS) 100 MG CAPS Take 1 capsule by mouth as needed (constipation). 04/08/20  Yes [provider]  doxycycline (VIBRA-TABS) 100 MG tablet Take 1 tablet (100 mg total) by mouth 2 (two) times daily. 12/09/22  Yes Candelaria Stagers, DPM  esomeprazole (NEXIUM) 20 MG capsule Take 20 mg by mouth daily at 12 noon.   Yes [provider]  FLUoxetine (PROZAC) 40 MG capsule Take 40 mg by mouth daily.   Yes [provider]  formoterol (PERFOROMIST) 20 MCG/2ML nebulizer solution Take 2 mLs (20 mcg total) by nebulization 2 (two) times daily. 09/16/22  Yes Cobb, Ruby Cola, NP  gabapentin (NEURONTIN) 300 MG capsule Take 900 mg by mouth 3 (three) times daily.   Yes [provider]  insulin regular human CONCENTRATED (HUMULIN R) 500 UNIT/ML injection Inject 90 Units into the skin 3 (three) times daily with meals.   Yes [provider]  isosorbide mononitrate (IMDUR) 60 MG 24 hr tablet Take 1 tablet (60 mg total) by mouth in the morning. & 30 mg in the evening 03/27/22  Yes Jonelle Sidle, MD  linagliptin (TRADJENTA) 5 MG TABS tablet Take 5 mg by mouth daily.   Yes [provider]  midodrine (PROAMATINE) 2.5 MG tablet Take 1 tablet (2.5 mg total) by mouth 2 (two) times daily. 08/11/22  Yes Jonelle Sidle, MD  oxyCODONE-acetaminophen (PERCOCET) 10-325 MG tablet Take 1 tablet by mouth daily as needed for pain. 09/25/21  Yes [provider]  potassium chloride SA (K-DUR,KLOR-CON) 20 MEQ tablet Take 20 mEq by mouth 2 (two) times daily.    Yes [provider]  pregabalin (LYRICA) 25 MG capsule Take 25 mg by mouth 2 (two) times daily. 02/14/19  Yes [provider]  ranolazine (RANEXA) 500 MG 12 hr tablet Take 1 tablet (500 mg total) by mouth 2 (two) times daily. 08/27/22  Yes Sharlene Dory, NP   revefenacin (YUPELRI) 175 MCG/3ML nebulizer solution Take 3 mLs (175 mcg total) by nebulization daily. 09/16/22  Yes Cobb, Ruby Cola, NP  tamsulosin (FLOMAX) 0.4 MG CAPS capsule Take 0.4 mg by mouth daily.    Yes [provider]  torsemide (DEMADEX) 20 MG tablet TAKE THREE TABLETS BY MOUTH TWICE DAILY Patient taking differently: Take 60 mg by mouth 2 (two) times daily. 06/26/21  Yes Jonelle Sidle, MD  zolpidem (AMBIEN) 5 MG tablet Take 5 mg by mouth at bedtime. For insomnia. 03/03/19  Yes [provider]  glucose blood (TRUE METRIX BLOOD GLUCOSE TEST) test strip     [provider]  nitroGLYCERIN (NITROSTAT) 0.4 MG SL tablet Place 1 tablet (0.4 mg total) under the tongue every 5 (five) minutes x 3 doses as needed for chest pain (if no relief after 2nd dose, proceed to ED or call 911). Patient not taking: Reported on 12/22/2022 06/26/22   Jonelle Sidle, MD  NOVOLOG FLEXPEN 100 UNIT/ML FlexPen 30 units 5-10 minutes before meals Subcutaneous three times a day for 30 days    [provider]    Physical Exam: BP (!) 165/66 (BP Location: Right Arm)   Pulse (!) 59   Temp 97.8 F (36.6 C) (Oral)   Resp 16   Ht 5\' 9"  (1.753 m)   Wt 100.9 kg   SpO2 100%   BMI 32.84 kg/m   General: 67 y.o. year-old male ill appearing, but in no acute distress.  Alert and oriented x3. HEENT: NCAT, EOMI Neck: Supple, trachea medial Cardiovascular: Regular rate and rhythm with no rubs or gallops.  No thyromegaly or JVD noted.  No lower extremity edema. 2/4 pulses in all 4 extremities. Respiratory: Clear to auscultation with no wheezes or rales. Good inspiratory effort. Abdomen: Soft, nontender nondistended with normal bowel sounds x4 quadrants. Muskuloskeletal: No cyanosis, clubbing or edema noted bilaterally Neuro: CN II-XII intact, sensation, reflexes intact Skin: No ulcerative lesions noted or rashes Psychiatry: Mood is appropriate for condition and setting           Labs on Admission:  Basic Metabolic Panel: Recent Labs  Lab 12/22/22 1237  NA 133*  K 3.5  CL 99  CO2 23  GLUCOSE 189*  BUN 12  CREATININE 1.24  CALCIUM 8.7*   Liver Function Tests: Recent Labs  Lab 12/22/22 1237  AST 18  ALT 10  ALKPHOS 72  BILITOT 1.2  PROT 7.3  ALBUMIN 3.5   Recent Labs  Lab 12/22/22 1237  LIPASE 36   No results for input(s): "AMMONIA" in the last 168 hours. CBC: Recent Labs  Lab 12/22/22 1411  WBC 7.6  NEUTROABS 5.9  HGB 15.7  HCT 48.1  MCV 82.8  PLT 157   Cardiac Enzymes: No results for input(s): "CKTOTAL", "CKMB", "CKMBINDEX", "TROPONINI" in the last 168 hours.  BNP (last 3 results) No results for input(s): "BNP" in the last 8760 hours.  ProBNP (last 3 results) Recent Labs    09/25/22 1412  PROBNP 54.0    CBG: No results for input(s): "GLUCAP" in the last 168 hours.  Radiological Exams on Admission: DG Chest Portable 1 View  Result Date: 12/22/2022 CLINICAL DATA:  [facility] EXAM: PORTABLE CHEST 1 VIEW COMPARISON:  09/04/2022.  05/24/2022. FINDINGS: Pleuroparenchymal scarring at the left base is stable since prior and comparing back to 05/24/2022. Right lung clear. Cardiopericardial silhouette is at upper limits of normal for size. Bones are diffusely demineralized. Telemetry leads overlie the chest. IMPRESSION: Chronic pleuroparenchymal scarring at the left base. No acute cardiopulmonary findings. Electronically Signed   By: Kennith Center M.D.   On: 12/22/2022 14:44    EKG: I independently viewed the EKG done and my findings are as followed: EKG was not done in the ED  Assessment/Plan Present on Admission:  Diarrhea  Hyperglycemia due to diabetes mellitus ([name])  COPD (chronic obstructive pulmonary disease) ([name])  Chronic respiratory failure with hypoxia ([name])  Essential hypertension  Mixed hyperlipidemia  GERD without esophagitis  CAD in native artery  Principal Problem:   Generalized weakness Active Problems:    Chronic respiratory failure with hypoxia ([name])   CAD in native artery   COPD (chronic obstructive pulmonary disease) ([name])   Essential hypertension   Mixed hyperlipidemia   Hyperglycemia due to diabetes mellitus ([name])   Body mass index (BMI) 38.0-38.9, adult   GERD without esophagitis   Diarrhea   Nausea  Generalized weakness Continue fall precaution Continue IV hydration Protein supplement will be provided  Nausea Continue Zofran  Diarrhea Last bowel movement was this morning CBC and GI stool panel pending collection  Type 2 diabetes mellitus with hyperglycemia Continue Semglee 8 units nightly and adjust dose accordingly Continue ISS and hypoglycemic protocol  COPD not in acute exacerbation Continue Pulmicort, albuterol  Chronic respiratory failure with hypoxia Continue on supplemental oxygen to maintain O2 sat > 92%  Essential hypertension BP meds temporarily held at this time due to orthostatic hypotension  Mixed hyperlipidemia Continue Lipitor  GERD Continue Protonix  CAD s/p stent placement Continue Plavix, Lipitor  Obesity (BMI 32.84) Diet and lifestyle modification  DVT prophylaxis: Lovenox  Advance Care Planning: DNR  Consults: None  Family Communication: None at bedside  Severity of Illness: The appropriate patient status for this patient is OBSERVATION. Observation status is judged to be reasonable and necessary in order to provide the required intensity of service to ensure the patient's safety. The patient's presenting symptoms, physical exam findings, and initial radiographic and laboratory data in the context of their medical condition is felt to place them at decreased risk for further clinical deterioration. Furthermore, it is anticipated that the patient will be medically stable for discharge from the hospital within 2 midnights of admission.   Author: Frankey Shown, DO 12/23/2022 12:43 AM  For on call review [email or web address].  Continue  Zofran

## 2022-12-22 NOTE — ED Notes (Signed)
No needs at this time, Patient also expressed he has no urge to urinate or have a bowel movement

## 2022-12-23 DIAGNOSIS — R531 Weakness: Secondary | ICD-10-CM | POA: Diagnosis not present

## 2022-12-23 DIAGNOSIS — R110 Nausea: Secondary | ICD-10-CM | POA: Insufficient documentation

## 2022-12-23 LAB — COMPREHENSIVE METABOLIC PANEL
ALT: 8 U/L (ref 0–44)
AST: 8 U/L — ABNORMAL LOW (ref 15–41)
Albumin: 3.1 g/dL — ABNORMAL LOW (ref 3.5–5.0)
Alkaline Phosphatase: 65 U/L (ref 38–126)
Anion gap: 7 (ref 5–15)
BUN: 12 mg/dL (ref 8–23)
CO2: 25 mmol/L (ref 22–32)
Calcium: 8.6 mg/dL — ABNORMAL LOW (ref 8.9–10.3)
Chloride: 105 mmol/L (ref 98–111)
Creatinine, Ser: 1.06 mg/dL (ref 0.61–1.24)
GFR, Estimated: >60 mL/min (ref >60)
Glucose, Bld: 158 mg/dL — ABNORMAL HIGH (ref 70–99)
Potassium: 3.1 mmol/L — ABNORMAL LOW (ref 3.5–5.1)
Sodium: 137 mmol/L (ref 135–145)
Total Bilirubin: 0.8 mg/dL (ref 0.3–1.2)
Total Protein: 6.4 g/dL — ABNORMAL LOW (ref 6.5–8.1)

## 2022-12-23 LAB — CBC
HCT: 45.9 % (ref 39.0–52.0)
Hemoglobin: 15 g/dL (ref 13.0–17.0)
MCH: 27.6 pg (ref 26.0–34.0)
MCHC: 32.7 g/dL (ref 30.0–36.0)
MCV: 84.5 fL (ref 80.0–100.0)
Platelets: 161 10*3/uL (ref 150–400)
RBC: 5.43 MIL/uL (ref 4.22–5.81)
RDW: 13.5 % (ref 11.5–15.5)
WBC: 7.1 10*3/uL (ref 4.0–10.5)
nRBC: 0 % (ref 0.0–0.2)

## 2022-12-23 LAB — GLUCOSE, CAPILLARY
Glucose-Capillary: 191 mg/dL — ABNORMAL HIGH (ref 70–99)
Glucose-Capillary: 158 mg/dL — ABNORMAL HIGH (ref 70–99)
Glucose-Capillary: 148 mg/dL — ABNORMAL HIGH (ref 70–99)
Glucose-Capillary: 230 mg/dL — ABNORMAL HIGH (ref 70–99)
Glucose-Capillary: 160 mg/dL — ABNORMAL HIGH (ref 70–99)

## 2022-12-23 LAB — PHOSPHORUS: Phosphorus: 2.8 mg/dL (ref 2.5–4.6)

## 2022-12-23 LAB — MAGNESIUM: Magnesium: 1.7 mg/dL (ref 1.7–2.4)

## 2022-12-23 LAB — HEMOGLOBIN A1C
Hgb A1c MFr Bld: 9.3 % — ABNORMAL HIGH (ref 4.8–5.6)
Mean Plasma Glucose: 220 mg/dL

## 2022-12-23 LAB — HIV ANTIBODY (ROUTINE TESTING W REFLEX): HIV Screen 4th Generation wRfx: NONREACTIVE

## 2022-12-23 MED ORDER — TAMSULOSIN HCL 0.4 MG PO CAPS
0.4000 mg | ORAL_CAPSULE | Freq: Every day | ORAL | Status: DC
  Administered 2022-12-23 – 2022-12-24 (×2): 0.4 mg via ORAL
  Filled 2022-12-23 (×2): qty 1

## 2022-12-23 MED ORDER — ALBUTEROL SULFATE (2.5 MG/3ML) 0.083% IN NEBU
2.5000 mg | INHALATION_SOLUTION | Freq: Four times a day (QID) | RESPIRATORY_TRACT | Status: DC | PRN
  Administered 2022-12-24: 2.5 mg via RESPIRATORY_TRACT
  Filled 2022-12-23: qty 3

## 2022-12-23 MED ORDER — TORSEMIDE 20 MG PO TABS
60.0000 mg | ORAL_TABLET | Freq: Two times a day (BID) | ORAL | Status: DC
  Administered 2022-12-23 (×2): 60 mg via ORAL
  Filled 2022-12-23 (×2): qty 3

## 2022-12-23 MED ORDER — ATORVASTATIN CALCIUM 10 MG PO TABS
20.0000 mg | ORAL_TABLET | Freq: Every day | ORAL | Status: DC
  Administered 2022-12-23: 20 mg via ORAL
  Filled 2022-12-23: qty 2

## 2022-12-23 MED ORDER — BENZONATATE 100 MG PO CAPS: 200.0000 mg | ORAL_CAPSULE | Freq: Three times a day (TID) | ORAL | Status: DC | PRN

## 2022-12-23 MED ORDER — ZOLPIDEM TARTRATE 5 MG PO TABS
5.0000 mg | ORAL_TABLET | Freq: Every day | ORAL | Status: DC
  Administered 2022-12-23: 5 mg via ORAL
  Filled 2022-12-23: qty 1

## 2022-12-23 MED ORDER — GLUCERNA SHAKE PO LIQD
237.0000 mL | Freq: Three times a day (TID) | ORAL | Status: DC
  Administered 2022-12-23 – 2022-12-24 (×4): 237 mL via ORAL

## 2022-12-23 MED ORDER — ARFORMOTEROL TARTRATE 15 MCG/2ML IN NEBU
15.0000 ug | INHALATION_SOLUTION | Freq: Two times a day (BID) | RESPIRATORY_TRACT | Status: DC
  Administered 2022-12-23 – 2022-12-24 (×3): 15 ug via RESPIRATORY_TRACT
  Filled 2022-12-23 (×3): qty 2

## 2022-12-23 MED ORDER — INSULIN ASPART 100 UNIT/ML IJ SOLN
0.0000 [IU] | Freq: Every day | INTRAMUSCULAR | Status: DC
  Administered 2022-12-23: 3 [IU] via SUBCUTANEOUS

## 2022-12-23 MED ORDER — INSULIN GLARGINE-YFGN 100 UNIT/ML ~~LOC~~ SOLN
8.0000 [IU] | Freq: Every day | SUBCUTANEOUS | Status: DC
  Filled 2022-12-23: qty 0.08

## 2022-12-23 MED ORDER — ALPRAZOLAM 1 MG PO TABS
1.0000 mg | ORAL_TABLET | Freq: Three times a day (TID) | ORAL | Status: DC | PRN
  Administered 2022-12-23: 1 mg via ORAL
  Filled 2022-12-23: qty 1

## 2022-12-23 MED ORDER — DILTIAZEM HCL ER COATED BEADS 180 MG PO CP24
180.0000 mg | ORAL_CAPSULE | Freq: Every day | ORAL | Status: DC
  Administered 2022-12-23 – 2022-12-24 (×2): 180 mg via ORAL
  Filled 2022-12-23 (×2): qty 1

## 2022-12-23 MED ORDER — LINAGLIPTIN 5 MG PO TABS
5.0000 mg | ORAL_TABLET | Freq: Every day | ORAL | Status: DC
  Administered 2022-12-23 – 2022-12-24 (×2): 5 mg via ORAL
  Filled 2022-12-23 (×2): qty 1

## 2022-12-23 MED ORDER — POTASSIUM CHLORIDE CRYS ER 20 MEQ PO TBCR
40.0000 meq | EXTENDED_RELEASE_TABLET | ORAL | Status: AC
  Administered 2022-12-23 (×2): 40 meq via ORAL
  Filled 2022-12-23 (×2): qty 2

## 2022-12-23 MED ORDER — SODIUM CHLORIDE 0.9 % IV SOLN: INTRAVENOUS | Status: AC

## 2022-12-23 MED ORDER — PREGABALIN 25 MG PO CAPS
25.0000 mg | ORAL_CAPSULE | Freq: Two times a day (BID) | ORAL | Status: DC
  Administered 2022-12-23 – 2022-12-24 (×3): 25 mg via ORAL
  Filled 2022-12-23 (×3): qty 1

## 2022-12-23 MED ORDER — CLOPIDOGREL BISULFATE 75 MG PO TABS
75.0000 mg | ORAL_TABLET | Freq: Every day | ORAL | Status: DC
  Administered 2022-12-23 – 2022-12-24 (×2): 75 mg via ORAL
  Filled 2022-12-23 (×2): qty 1

## 2022-12-23 MED ORDER — ACETAMINOPHEN 325 MG PO TABS: 650.0000 mg | ORAL_TABLET | ORAL | Status: DC

## 2022-12-23 MED ORDER — RANOLAZINE ER 500 MG PO TB12
500.0000 mg | ORAL_TABLET | Freq: Two times a day (BID) | ORAL | Status: DC
  Administered 2022-12-23 – 2022-12-24 (×3): 500 mg via ORAL
  Filled 2022-12-23 (×3): qty 1

## 2022-12-23 MED ORDER — GABAPENTIN 300 MG PO CAPS
900.0000 mg | ORAL_CAPSULE | Freq: Three times a day (TID) | ORAL | Status: DC
  Administered 2022-12-23 – 2022-12-24 (×4): 900 mg via ORAL
  Filled 2022-12-23 (×4): qty 3

## 2022-12-23 MED ORDER — PANTOPRAZOLE SODIUM 40 MG PO TBEC
40.0000 mg | DELAYED_RELEASE_TABLET | Freq: Every day | ORAL | Status: DC
  Administered 2022-12-23 – 2022-12-24 (×2): 40 mg via ORAL
  Filled 2022-12-23 (×2): qty 1

## 2022-12-23 MED ORDER — FLUOXETINE HCL 20 MG PO CAPS
40.0000 mg | ORAL_CAPSULE | Freq: Every day | ORAL | Status: DC
  Administered 2022-12-23 – 2022-12-24 (×2): 40 mg via ORAL
  Filled 2022-12-23 (×2): qty 2

## 2022-12-23 MED ORDER — INSULIN ASPART 100 UNIT/ML IJ SOLN
0.0000 [IU] | Freq: Three times a day (TID) | INTRAMUSCULAR | Status: DC
  Administered 2022-12-23: 5 [IU] via SUBCUTANEOUS
  Administered 2022-12-23: 3 [IU] via SUBCUTANEOUS
  Administered 2022-12-23: 2 [IU] via SUBCUTANEOUS
  Administered 2022-12-24: 3 [IU] via SUBCUTANEOUS

## 2022-12-23 MED ORDER — BUDESONIDE 0.5 MG/2ML IN SUSP
1.0000 mg | Freq: Every day | RESPIRATORY_TRACT | Status: DC
  Administered 2022-12-23 – 2022-12-24 (×2): 1 mg via RESPIRATORY_TRACT
  Filled 2022-12-23 (×3): qty 4

## 2022-12-23 NOTE — Evaluation (Signed)
Physical Therapy Evaluation Patient Details Name: [name] MRN: [id number] DOB: 18-Sep-1955 Today's Date: 12/23/2022  History of Present Illness  [name] is a 67 y.o. male with medical history significant of chronic respiratory failure with hypoxemia on supplemental oxygen at 6LPM, COPD, type 2 diabetes mellitus with diabetic neuropathy, CAD s/p stent placement, essential hypertension, GERD, diastolic CHF hyperlipidemia, sleep apnea not on CPAP who presents to the emergency department due to 1 week onset of nausea, diarrhea, generalized weakness.  Diarrhea was up to 4 episodes of loose bowel movement daily since onset a week ago.  Diarrhea started after completing a course of doxycycline for left foot infection which is currently resolved.  He denies fever, chills, vomiting, abdominal pain, chest pain or shortness of breath   Clinical Impression  Patient demonstrates good return for sitting up at bedside with [facility] partially raises, very unsteady on feet having to lean on nearby objects for support when taking steps without AD, also c/o mild lightheadedness and required use of RW for safety.   Patient ambulated in hallway with ataxic like gait  mostly due to baseline neuropathy in feet with decreased proprioception, no loss of balance, limited secondary to fatigue and SpO2 at 98% while on 6 LPM O2. Patient tolerated sitting up in chair after therapy - nurse notified. Patient will benefit from continued skilled physical therapy in hospital and recommended venue below to increase strength, balance, endurance for safe ADLs and gait.         Recommendations for follow up therapy are one component of a multi-disciplinary discharge planning process, led by the attending physician.  Recommendations may be updated based on patient status, additional functional criteria and insurance authorization.  Follow Up Recommendations       Assistance Recommended at Discharge Set up Supervision/Assistance   Patient can return home with the following  A little help with walking and/or transfers;A little help with bathing/dressing/bathroom;Help with stairs or ramp for entrance;Assistance with cooking/housework    Equipment Recommendations None recommended by PT  Recommendations for Other Services       Functional Status Assessment Patient has had a recent decline in their functional status and demonstrates the ability to make significant improvements in function in a reasonable and predictable amount of time.     Precautions / Restrictions Precautions Precautions: Fall Restrictions Weight Bearing Restrictions: No      Mobility  Bed Mobility                    Transfers Overall transfer level: Modified independent                 General transfer comment: HOB partially raised    Ambulation/Gait Ambulation/Gait assistance: Min guard, Min assist Gait Distance (Feet): 45 Feet Assistive device: Rolling walker (2 wheels) Gait Pattern/deviations: Decreased step length - right, Decreased step length - left, Decreased stride length, Ataxic Gait velocity: slow     General Gait Details: slow labored cadence with ataxic like gait mostly due to numbness BLE feet due to diabetic neuropathy, no loss of balance, limited mostly due to fatigue  Stairs            Wheelchair Mobility    Modified Rankin (Stroke Patients Only)       Balance Overall balance assessment: Needs assistance Sitting-balance support: Feet supported, No upper extremity supported Sitting balance-Leahy Scale: Good Sitting balance - Comments: seated at EOB   Standing balance support: During functional activity, No upper extremity supported  Standing balance-Leahy Scale: Poor Standing balance comment: fair using RW                             Pertinent Vitals/Pain Pain Assessment Pain Assessment: No/denies pain    Home Living Family/patient expects to be discharged to:: Private  residence Living Arrangements: Parent Available Help at Discharge: Family;Personal care attendant;Available PRN/intermittently Type of Home: Mobile home Home Access: Stairs to enter;Ramped entrance       Home Layout: One level Home Equipment: Agricultural consultant (2 wheels);Rollator (4 wheels);Shower seat;BSC/3in1;Wheelchair - manual;Electric scooter;Hospital bed      Prior Function Prior Level of Function : Needs assist       Physical Assist : Mobility (physical);ADLs (physical) Mobility (physical): Bed mobility;Transfers;Gait;Stairs   Mobility Comments: household ambulator using SPC PRN, or RW for longer distances ADLs Comments: Home aides 4 hours/day x 5 days/week     Hand Dominance   Dominant Hand: Right    Extremity/Trunk Assessment   Upper Extremity Assessment Upper Extremity Assessment: Overall WFL for tasks assessed    Lower Extremity Assessment Lower Extremity Assessment: Generalized weakness    Cervical / Trunk Assessment Cervical / Trunk Assessment: Normal  Communication   Communication: No difficulties  Cognition Arousal/Alertness: Awake/alert Behavior During Therapy: WFL for tasks assessed/performed Overall Cognitive Status: Within Functional Limits for tasks assessed                                          General Comments      Exercises     Assessment/Plan    PT Assessment Patient needs continued PT services  PT Problem List Decreased strength;Decreased activity tolerance;Decreased balance;Decreased mobility       PT Treatment Interventions DME instruction;Gait training;Stair training;Functional mobility training;Therapeutic activities;Therapeutic exercise;Patient/family education;Balance training    PT Goals (Current goals can be found in the Care Plan section)  Acute Rehab PT Goals Patient Stated Goal: return home with family and home aides to assist PT Goal Formulation: With patient Time For Goal Achievement:  12/25/22 Potential to Achieve Goals: Good    Frequency Min 3X/week     Co-evaluation               AM-PAC PT "6 Clicks" Mobility  Outcome Measure Help needed turning from your back to your side while in a flat bed without using bedrails?: None Help needed moving from lying on your back to sitting on the side of a flat bed without using bedrails?: None Help needed moving to and from a bed to a chair (including a wheelchair)?: A Little Help needed standing up from a chair using your arms (e.g., wheelchair or bedside chair)?: A Little Help needed to walk in hospital room?: A Lot Help needed climbing 3-5 steps with a railing? : A Lot 6 Click Score: 18    End of Session Equipment Utilized During Treatment: Oxygen Activity Tolerance: Patient tolerated treatment well;Patient limited by fatigue Patient left: in chair;with call bell/phone within reach Nurse Communication: Mobility status PT Visit Diagnosis: Unsteadiness on feet (R26.81);Other abnormalities of gait and mobility (R26.89);Muscle weakness (generalized) (M62.81)    Time: 1430-1501 PT Time Calculation (min) (ACUTE ONLY): 31 min   Charges:   PT Evaluation $PT Eval Moderate Complexity: 1 Mod PT Treatments $Therapeutic Activity: 23-37 mins        3:23 PM, 12/23/22 Ocie Bob, MPT  Physical Therapist with [facility] 336 [phone] office [phone] mobile phone

## 2022-12-23 NOTE — Progress Notes (Signed)
PROGRESS NOTE    [name]  NWG:[id number] DOB: Jan 07, 1956 DOA: 12/22/2022 PCP: Genia Hotter, FNP   Brief Narrative:  HPI: [name] is a 67 y.o. male with medical history significant of chronic respiratory failure with hypoxemia on supplemental oxygen at 6LPM, COPD, type 2 diabetes mellitus with diabetic neuropathy, CAD s/p stent placement, essential hypertension, GERD, diastolic CHF hyperlipidemia, sleep apnea not on CPAP who presents to the emergency department due to 1 week onset of nausea, diarrhea, generalized weakness.  Diarrhea was up to 4 episodes of loose bowel movement daily since onset a week ago.  Diarrhea started after completing a course of doxycycline for left foot infection which is currently resolved.  He denies fever, chills, vomiting, abdominal pain, chest pain or shortness of breath   ED Course:  In the emergency department, respiratory rate was 24/min on arrival to the ED, other vital signs are within normal range.  Workup in the ED showed normal CBC and BMP except for sodium of 133 and blood glucose of 189.  Urinalysis was positive for proteinuria, lipase was 36. Chest x-ray showed chronic pleural-parenchymal scarring at the left base.  No acute cardiopulmonary findings. Patient was treated with Zofran, IV hydration was provided. Hospitalist was asked to admit patient for further evaluation and management.  Assessment & Plan:   Principal Problem:   Generalized weakness Active Problems:   Chronic respiratory failure with hypoxia ([name])   CAD in native artery   COPD (chronic obstructive pulmonary disease) ([name])   Essential hypertension   Mixed hyperlipidemia   Hyperglycemia due to diabetes mellitus ([name])   Body mass index (BMI) 38.0-38.9, adult   GERD without esophagitis   Diarrhea   Nausea  Generalized weakness: Likely secondary to diarrhea.  Will consult PT OT.  Nausea/vomiting: Improved.  Continue symptomatic treatment.  Diarrhea: Could be due  to C. difficile since he had recent exposure to antibiotics.  CT and GI pathogen panel pending.  Patient has not had any episode of diarrhea since admission.  Continue Zosyn in the meantime.  Chronic hypoxic respiratory failure: Requiring 6 L of oxygen chronically.  Currently at baseline.  Type 2 diabetes mellitus: Continue 8 units of Semglee nightly with SSI.  Blood sugar fairly controlled.  Hypokalemia: Will replace.  GERD: Protonix.  CAD s/p stent placement: Stable, asymptomatic.  Continue Plavix and Lipitor.  Mixed hyperlipidemia: Continue Lipitor.  COPD: Not in acute exacerbation.  Continue bronchodilators.  Essential hypertension: Blood pressure fairly controlled.  Will resume diltiazem but hold Imdur.  Obesity: Weight loss and dietary modification counseled.  DVT prophylaxis: enoxaparin (LOVENOX) injection 40 mg Start: 12/22/22 2230 SCDs Start: 12/22/22 2206   Code Status: DNR  Family Communication:  None present at bedside.  Plan of care discussed with patient in length and he/she verbalized understanding and agreed with it.  Status is: Observation The patient will require care spanning > 2 midnights and should be moved to inpatient because: Patient feels significantly weak, needs PT OT assessment, may need to be discharged to SNF.   Estimated body mass index is 32.84 kg/m as calculated from the following:   Height as of this encounter: 5\' 9"  (1.753 m).   Weight as of this encounter: 100.9 kg.    Nutritional Assessment: Body mass index is 32.84 kg/m.[name] Seen by dietician.  I agree with the assessment and plan as outlined below: Nutrition Status:        . Skin Assessment: I have examined the patient's skin and  I agree with the wound assessment as performed by the wound care RN as outlined below:    Consultants:  None  Procedures:  None  Antimicrobials:  Anti-infectives (From admission, onward)    None         Subjective: Patient seen and examined.   His nausea vomiting and diarrhea has improved but he feels profoundly weak.  No other complaint.  Objective: Vitals:   12/22/22 2100 12/22/22 2130 12/22/22 2334 12/23/22 0316  BP: (!) 147/81 (!) 142/65 (!) 165/66 137/86  Pulse: 63 71 (!) 59 76  Resp: 20 19 16 14   Temp:   97.8 F (36.6 C) 98.2 F (36.8 C)  TempSrc:   Oral Oral  SpO2: 100% 97% 100% 95%  Weight:      Height:        Intake/Output Summary (Last 24 hours) at 12/23/2022 0738 Last data filed at 12/23/2022 0500 Gross per 24 hour  Intake 1068.33 ml  Output --  Net 1068.33 ml   Filed Weights   12/22/22 1228  Weight: 100.9 kg    Examination:  General exam: Appears very weak and tired Respiratory system: Rhonchi bilaterally. Respiratory effort normal. Cardiovascular system: S1 & S2 heard, RRR. No JVD, murmurs, rubs, gallops or clicks. No pedal edema. Gastrointestinal system: Abdomen is nondistended, soft and nontender. No organomegaly or masses felt. Normal bowel sounds heard. Central nervous system: Alert and oriented. No focal neurological deficits. Extremities: Symmetric 5 x 5 power. Skin: No rashes, lesions or ulcers Psychiatry: Judgement and insight appear normal. Mood & affect appropriate.    Data Reviewed: I have personally reviewed following labs and imaging studies  CBC: Recent Labs  Lab 12/22/22 1411 12/23/22 0502  WBC 7.6 7.1  NEUTROABS 5.9  --   HGB 15.7 15.0  HCT 48.1 45.9  MCV 82.8 84.5  PLT 157 161   Basic Metabolic Panel: Recent Labs  Lab 12/22/22 1237 12/23/22 0502  NA 133* 137  K 3.5 3.1*  CL 99 105  CO2 23 25  GLUCOSE 189* 158*  BUN 12 12  CREATININE 1.24 1.06  CALCIUM 8.7* 8.6*  MG  --  1.7  PHOS  --  2.8   GFR: Estimated Creatinine Clearance: 79.2 mL/min (by C-G formula based on SCr of 1.06 mg/dL). Liver Function Tests: Recent Labs  Lab 12/22/22 1237 12/23/22 0502  AST 18 8*  ALT 10 8  ALKPHOS 72 65  BILITOT 1.2 0.8  PROT 7.3 6.4*  ALBUMIN 3.5 3.1*   Recent  Labs  Lab 12/22/22 1237  LIPASE 36   No results for input(s): "AMMONIA" in the last 168 hours. Coagulation Profile: No results for input(s): "INR", "PROTIME" in the last 168 hours. Cardiac Enzymes: No results for input(s): "CKTOTAL", "CKMB", "CKMBINDEX", "TROPONINI" in the last 168 hours. BNP (last 3 results) Recent Labs    09/25/22 1412  PROBNP 54.0   HbA1C: No results for input(s): "HGBA1C" in the last 72 hours. CBG: Recent Labs  Lab 12/23/22 0130 12/23/22 0726  GLUCAP 191* 158*   Lipid Profile: No results for input(s): "CHOL", "HDL", "LDLCALC", "TRIG", "CHOLHDL", "LDLDIRECT" in the last 72 hours. Thyroid Function Tests: No results for input(s): "TSH", "T4TOTAL", "FREET4", "T3FREE", "THYROIDAB" in the last 72 hours. Anemia Panel: No results for input(s): "VITAMINB12", "FOLATE", "FERRITIN", "TIBC", "IRON", "RETICCTPCT" in the last 72 hours. Sepsis Labs: No results for input(s): "PROCALCITON", "LATICACIDVEN" in the last 168 hours.  No results found for this or any previous visit (from the past 240 hour(s)).  Radiology Studies: DG Chest Portable 1 View  Result Date: 12/22/2022 CLINICAL DATA:  [facility] EXAM: PORTABLE CHEST 1 VIEW COMPARISON:  09/04/2022.  05/24/2022. FINDINGS: Pleuroparenchymal scarring at the left base is stable since prior and comparing back to 05/24/2022. Right lung clear. Cardiopericardial silhouette is at upper limits of normal for size. Bones are diffusely demineralized. Telemetry leads overlie the chest. IMPRESSION: Chronic pleuroparenchymal scarring at the left base. No acute cardiopulmonary findings. Electronically Signed   By: Kennith Center M.D.   On: 12/22/2022 14:44    Scheduled Meds:  enoxaparin (LOVENOX) injection  40 mg Subcutaneous Q24H   feeding supplement (GLUCERNA SHAKE)  237 mL Oral TID BM   insulin aspart  0-15 Units Subcutaneous TID WC   insulin aspart  0-5 Units Subcutaneous QHS   [START ON 12/24/2022] insulin glargine-yfgn  8 Units  Subcutaneous QHS   potassium chloride  40 mEq Oral Q4H   Continuous Infusions:   LOS: 0 days   Hughie Closs, MD [facility]  12/23/2022, 7:38 AM   *Please note that this is a verbal dictation therefore any spelling or grammatical errors are due to the "Dragon [employer] One" system interpretation.  Please page via Amion and do not message via secure chat for urgent patient care matters. Secure chat can be used for non urgent patient care matters.  How to contact the [facility] Attending or Consulting provider 7A - 7P or covering provider during after hours 7P -7A, for this patient?  Check the care team in [facility] and look for a) attending/consulting [facility] provider listed and b) the [facility] team listed. Page or secure chat 7A-7P. Log into [email or web address] and use 's universal password to access. If you do not have the password, please contact the hospital operator. Locate the [facility] provider you are looking for under [facility] and page to a number that you can be directly reached. If you still have difficulty reaching the provider, please page the [facility] (Director on Call) for the Hospitalists listed on amion for assistance.

## 2022-12-23 NOTE — Plan of Care (Signed)
  Problem: Acute Rehab PT Goals(only PT should resolve) Goal: Pt Will Go Supine/Side To Sit Outcome: Progressing Flowsheets (Taken 12/23/2022 1524) Pt will go Supine/Side to Sit:  Independently  with modified independence Goal: Patient Will Transfer Sit To/From Stand Outcome: Progressing Flowsheets (Taken 12/23/2022 1524) Patient will transfer sit to/from stand:  with supervision  with modified independence Goal: Pt Will Transfer Bed To Chair/Chair To Bed Outcome: Progressing Flowsheets (Taken 12/23/2022 1524) Pt will Transfer Bed to Chair/Chair to Bed:  with supervision  min guard assist Goal: Pt Will Ambulate Outcome: Progressing Flowsheets (Taken 12/23/2022 1524) Pt will Ambulate:  75 feet  with min guard assist  with supervision  with rolling walker   3:24 PM, 12/23/22 Ocie Bob, MPT Physical Therapist with [facility] 336 [phone] office [phone] mobile phone

## 2022-12-23 NOTE — Care Management Obs Status (Signed)
MEDICARE OBSERVATION STATUS NOTIFICATION   Patient Details  Name: [name] MRN: [id number] Date of Birth: 05-05-1956   Medicare Observation Status Notification Given:  Yes    Elliot Gault, LCSW 12/23/2022, 9:58 AM

## 2022-12-23 NOTE — TOC CM/SW Note (Signed)
Transition of Care [facility]) - Inpatient Brief Assessment   Patient Details  Name: [name] MRN: [id number] Date of Birth: 04-03-1956  Transition of Care [facility]) CM/SW Contact:    Elliot Gault, LCSW Phone Number: 12/23/2022, 10:02 AM   Clinical Narrative:  Transition of Care Department [facility]) has reviewed patient and no TOC needs have been identified at this time. We will continue to monitor patient advancement through interdisciplinary progression rounds. If new patient transition needs arise, please place a TOC consult.  Transition of Care Asessment: Insurance and Status: Insurance coverage has been reviewed Patient has primary care physician: Yes Home environment has been reviewed: from home with mother Prior level of function:: independent Prior/Current Home Services: No current home services Social Determinants of Health Reivew: SDOH reviewed no interventions necessary Readmission risk has been reviewed: Yes Transition of care needs: no transition of care needs at this time

## 2022-12-24 DIAGNOSIS — N179 Acute kidney failure, unspecified: Secondary | ICD-10-CM

## 2022-12-24 DIAGNOSIS — J9611 Chronic respiratory failure with hypoxia: Secondary | ICD-10-CM | POA: Diagnosis not present

## 2022-12-24 DIAGNOSIS — R531 Weakness: Secondary | ICD-10-CM | POA: Diagnosis not present

## 2022-12-24 DIAGNOSIS — R197 Diarrhea, unspecified: Secondary | ICD-10-CM | POA: Diagnosis not present

## 2022-12-24 DIAGNOSIS — Z6838 Body mass index (BMI) 38.0-38.9, adult: Secondary | ICD-10-CM

## 2022-12-24 LAB — BASIC METABOLIC PANEL
Anion gap: 9 (ref 5–15)
BUN: 24 mg/dL — ABNORMAL HIGH (ref 8–23)
CO2: 25 mmol/L (ref 22–32)
Calcium: 8.5 mg/dL — ABNORMAL LOW (ref 8.9–10.3)
Chloride: 100 mmol/L (ref 98–111)
Creatinine, Ser: 2.08 mg/dL — ABNORMAL HIGH (ref 0.61–1.24)
GFR, Estimated: 34 mL/min — ABNORMAL LOW (ref >60)
Glucose, Bld: 186 mg/dL — ABNORMAL HIGH (ref 70–99)
Potassium: 4.1 mmol/L (ref 3.5–5.1)
Sodium: 134 mmol/L — ABNORMAL LOW (ref 135–145)

## 2022-12-24 LAB — GLUCOSE, CAPILLARY: Glucose-Capillary: 177 mg/dL — ABNORMAL HIGH (ref 70–99)

## 2022-12-24 MED ORDER — SODIUM CHLORIDE 0.9 % IV SOLN: INTRAVENOUS | Status: DC

## 2022-12-24 MED ORDER — POTASSIUM CHLORIDE CRYS ER 20 MEQ PO TBCR: 20.0000 meq | EXTENDED_RELEASE_TABLET | Freq: Two times a day (BID) | ORAL | Status: DC

## 2022-12-24 MED ORDER — TORSEMIDE 20 MG PO TABS: 60.0000 mg | ORAL_TABLET | Freq: Two times a day (BID) | ORAL | 0 refills | Status: DC

## 2022-12-24 NOTE — Discharge Summary (Addendum)
Physician Discharge Summary  [name] ZOX:[id number] DOB: 10-Apr-1956 DOA: 12/22/2022  PCP: Genia Hotter, FNP Cardiology: [facility]   Admit date: 12/22/2022 Discharge date: 12/24/2022  Admitted From:  Home  Disposition: Home with home health and 24/7 supervision  Recommendations for Outpatient Follow-up:  Follow up with PCP in 1 weeks Please obtain BMP in one week to check renal function   Home Health: PT   Discharge Condition: STABLE   CODE STATUS: DNR DIET: resume low sodium carb modified    Brief Hospitalization Summary: Please see all hospital notes, images, labs for full details of the hospitalization. ADMISSION PROVIDER HPI:  67 y.o. male with medical history significant of chronic respiratory failure with hypoxemia on supplemental oxygen at 6LPM, COPD, type 2 diabetes mellitus with diabetic neuropathy, CAD s/p stent placement, essential hypertension, GERD, diastolic CHF hyperlipidemia, sleep apnea not on CPAP who presents to the emergency department due to 1 week onset of nausea, diarrhea, generalized weakness.  Diarrhea was up to 4 episodes of loose bowel movement daily since onset a week ago.  Diarrhea started after completing a course of doxycycline for left foot infection which is currently resolved.  He denies fever, chills, vomiting, abdominal pain, chest pain or shortness of breath   ED Course:  In the emergency department, respiratory rate was 24/min on arrival to the ED, other vital signs are within normal range.  Workup in the ED showed normal CBC and BMP except for sodium of 133 and blood glucose of 189.  Urinalysis was positive for proteinuria, lipase was 36.  Chest x-ray showed chronic pleural-parenchymal scarring at the left base.  No acute cardiopulmonary findings.  Patient was treated with Zofran, IV hydration was provided.  Hospitalist was asked to admit patient for further evaluation and management.  HOSPITAL COURSE  Patient was admitted with generalized  weakness thought to be secondary to dehydration associated with an acute viral diarrheal illness.  He was also hypokalemic.  This was repleted and he was treated with IV fluid hydration.  He ended up being restarted on his home torsemide 60 mg twice daily and developed a mild AKI.  The diuretics have currently been on hold and he was hydrated again.  He is feeling better and back to his baseline.  He reports that he has strong support at home with an aide and 24/7 supervision and agreeable to home health care.  This has been arranged.  We are asking him to have a brief diuretic holiday and he was advised to resume his diuretics and potassium supplement on 12/28/2022.  He is no longer having diarrhea.  We have not been able to obtain a stool sample due to him not having any bowel movements.  He feels well and stable to discharge home.  We have recommended close outpatient follow-up with his PCP to have repeat labs to monitor renal function.  He is being discharged home in stable condition.  Patient reports that he already has a cardiology follow-up appointment scheduled.  He was strongly advised to see his PCP in about 1 week to have labs done and he verbalized understanding.  Discharge Diagnoses:  Principal Problem:   Generalized weakness Active Problems:   CAD in native artery   COPD (chronic obstructive pulmonary disease) ([name])   Essential hypertension   Mixed hyperlipidemia   Hyperglycemia due to diabetes mellitus ([name])   Chronic respiratory failure with hypoxia ([name])   Body mass index (BMI) 38.0-38.9, adult   GERD without esophagitis  Diarrhea   Nausea   Discharge Instructions:  Allergies as of 12/24/2022       Reactions   Lisinopril Other (See Comments)   Other reaction(s): worsening kidney function   Pollen Extract    Other reaction(s): sneezing   Oxycodone Itching, Nausea Only   Pt is able to tolerate if taken with benadryl        Medication List     STOP taking these  medications    gabapentin 300 MG capsule Commonly known as: NEURONTIN   nitroGLYCERIN 0.4 MG SL tablet Commonly known as: NITROSTAT   NovoLOG FlexPen 100 UNIT/ML FlexPen Generic drug: insulin aspart       TAKE these medications    acetaminophen 325 MG tablet Commonly known as: TYLENOL Take 650 mg by mouth See admin instructions. Take 650 mg every 3-4 hours as needed for pain.   albuterol (2.5 MG/3ML) 0.083% nebulizer solution Commonly known as: PROVENTIL Take 3 mLs (2.5 mg total) by nebulization every 6 (six) hours as needed for wheezing or shortness of breath.   ALPRAZolam 1 MG tablet Commonly known as: XANAX Take 1 mg by mouth 3 (three) times daily as needed.   atorvastatin 20 MG tablet Commonly known as: LIPITOR Take 20 mg by mouth at bedtime.   benzonatate 200 MG capsule Commonly known as: TESSALON TAKE ONE CAPSULE BY MOUTH THREE TIMES DAILY AS NEEDED FOR COUGH What changed: See the new instructions.   budesonide 1 MG/2ML nebulizer solution Commonly known as: PULMICORT Take 2 mLs (1 mg total) by nebulization in the morning and at bedtime.   clopidogrel 75 MG tablet Commonly known as: PLAVIX TAKE ONE TABLET BY MOUTH EVERY DAY   diltiazem 180 MG 24 hr capsule Commonly known as: CARDIZEM CD TAKE ONE CAPSULE BY MOUTH EVERY DAY   diphenhydrAMINE 25 mg capsule Commonly known as: BENADRYL Take 50 mg by mouth at bedtime.   doxycycline 100 MG tablet Commonly known as: VIBRA-TABS Take 1 tablet (100 mg total) by mouth 2 (two) times daily.   DSS 100 MG Caps Take 1 capsule by mouth as needed (constipation).   esomeprazole 20 MG capsule Commonly known as: NEXIUM Take 20 mg by mouth daily at 12 noon.   FLUoxetine 40 MG capsule Commonly known as: PROZAC Take 40 mg by mouth daily.   formoterol 20 MCG/2ML nebulizer solution Commonly known as: Perforomist Take 2 mLs (20 mcg total) by nebulization 2 (two) times daily.   insulin regular human CONCENTRATED 500  UNIT/ML injection Commonly known as: HUMULIN R Inject 90 Units into the skin 3 (three) times daily with meals.   isosorbide mononitrate 60 MG 24 hr tablet Commonly known as: IMDUR Take 1 tablet (60 mg total) by mouth in the morning. & 30 mg in the evening   linagliptin 5 MG Tabs tablet Commonly known as: TRADJENTA Take 5 mg by mouth daily.   midodrine 2.5 MG tablet Commonly known as: PROAMATINE Take 1 tablet (2.5 mg total) by mouth 2 (two) times daily.   oxyCODONE-acetaminophen 10-325 MG tablet Commonly known as: PERCOCET Take 1 tablet by mouth daily as needed for pain.   potassium chloride SA 20 MEQ tablet Commonly known as: KLOR-CON M Take 1 tablet (20 mEq total) by mouth 2 (two) times daily. Start taking on: December 28, 2022 What changed: These instructions start on December 28, 2022. If you are unsure what to do until then, ask your doctor or other care provider.   pregabalin 25 MG capsule Commonly known  as: LYRICA Take 25 mg by mouth 2 (two) times daily.   ranolazine 500 MG 12 hr tablet Commonly known as: RANEXA Take 1 tablet (500 mg total) by mouth 2 (two) times daily.   revefenacin 175 MCG/3ML nebulizer solution Commonly known as: YUPELRI Take 3 mLs (175 mcg total) by nebulization daily.   tamsulosin 0.4 MG Caps capsule Commonly known as: FLOMAX Take 0.4 mg by mouth daily.   torsemide 20 MG tablet Commonly known as: DEMADEX Take 3 tablets (60 mg total) by mouth 2 (two) times daily. Start taking on: December 28, 2022 What changed: These instructions start on December 28, 2022. If you are unsure what to do until then, ask your doctor or other care provider.   True Metrix Blood Glucose Test test strip Generic drug: glucose blood   zolpidem 5 MG tablet Commonly known as: AMBIEN Take 5 mg by mouth at bedtime. For insomnia.        Follow-up Information     [facility] Follow up.   Specialty: Home Health Services Why: Will contact you to schedule home  health visits. Contact information: [address] [place] [address] [phone]         [name], [name], FNP. Schedule an appointment as soon as possible for a visit in 1 week(s).   Specialty: Family Medicine Why: Hospital Follow Up Contact information: [address] [place] [address] [phone]                Allergies  Allergen Reactions   Lisinopril Other (See Comments)    Other reaction(s): worsening kidney function   Pollen Extract     Other reaction(s): sneezing   Oxycodone Itching and Nausea Only    Pt is able to tolerate if taken with benadryl   Allergies as of 12/24/2022       Reactions   Lisinopril Other (See Comments)   Other reaction(s): worsening kidney function   Pollen Extract    Other reaction(s): sneezing   Oxycodone Itching, Nausea Only   Pt is able to tolerate if taken with benadryl        Medication List     STOP taking these medications    gabapentin 300 MG capsule Commonly known as: NEURONTIN   nitroGLYCERIN 0.4 MG SL tablet Commonly known as: NITROSTAT   NovoLOG FlexPen 100 UNIT/ML FlexPen Generic drug: insulin aspart       TAKE these medications    acetaminophen 325 MG tablet Commonly known as: TYLENOL Take 650 mg by mouth See admin instructions. Take 650 mg every 3-4 hours as needed for pain.   albuterol (2.5 MG/3ML) 0.083% nebulizer solution Commonly known as: PROVENTIL Take 3 mLs (2.5 mg total) by nebulization every 6 (six) hours as needed for wheezing or shortness of breath.   ALPRAZolam 1 MG tablet Commonly known as: XANAX Take 1 mg by mouth 3 (three) times daily as needed.   atorvastatin 20 MG tablet Commonly known as: LIPITOR Take 20 mg by mouth at bedtime.   benzonatate 200 MG capsule Commonly known as: TESSALON TAKE ONE CAPSULE BY MOUTH THREE TIMES DAILY AS NEEDED FOR COUGH What changed: See the new instructions.   budesonide 1 MG/2ML nebulizer solution Commonly known as:  PULMICORT Take 2 mLs (1 mg total) by nebulization in the morning and at bedtime.   clopidogrel 75 MG tablet Commonly known as: PLAVIX TAKE ONE TABLET BY MOUTH EVERY DAY   diltiazem 180 MG 24  hr capsule Commonly known as: CARDIZEM CD TAKE ONE CAPSULE BY MOUTH EVERY DAY   diphenhydrAMINE 25 mg capsule Commonly known as: BENADRYL Take 50 mg by mouth at bedtime.   doxycycline 100 MG tablet Commonly known as: VIBRA-TABS Take 1 tablet (100 mg total) by mouth 2 (two) times daily.   DSS 100 MG Caps Take 1 capsule by mouth as needed (constipation).   esomeprazole 20 MG capsule Commonly known as: NEXIUM Take 20 mg by mouth daily at 12 noon.   FLUoxetine 40 MG capsule Commonly known as: PROZAC Take 40 mg by mouth daily.   formoterol 20 MCG/2ML nebulizer solution Commonly known as: Perforomist Take 2 mLs (20 mcg total) by nebulization 2 (two) times daily.   insulin regular human CONCENTRATED 500 UNIT/ML injection Commonly known as: HUMULIN R Inject 90 Units into the skin 3 (three) times daily with meals.   isosorbide mononitrate 60 MG 24 hr tablet Commonly known as: IMDUR Take 1 tablet (60 mg total) by mouth in the morning. & 30 mg in the evening   linagliptin 5 MG Tabs tablet Commonly known as: TRADJENTA Take 5 mg by mouth daily.   midodrine 2.5 MG tablet Commonly known as: PROAMATINE Take 1 tablet (2.5 mg total) by mouth 2 (two) times daily.   oxyCODONE-acetaminophen 10-325 MG tablet Commonly known as: PERCOCET Take 1 tablet by mouth daily as needed for pain.   potassium chloride SA 20 MEQ tablet Commonly known as: KLOR-CON M Take 1 tablet (20 mEq total) by mouth 2 (two) times daily. Start taking on: December 28, 2022 What changed: These instructions start on December 28, 2022. If you are unsure what to do until then, ask your doctor or other care provider.   pregabalin 25 MG capsule Commonly known as: LYRICA Take 25 mg by mouth 2 (two) times daily.   ranolazine 500 MG  12 hr tablet Commonly known as: RANEXA Take 1 tablet (500 mg total) by mouth 2 (two) times daily.   revefenacin 175 MCG/3ML nebulizer solution Commonly known as: YUPELRI Take 3 mLs (175 mcg total) by nebulization daily.   tamsulosin 0.4 MG Caps capsule Commonly known as: FLOMAX Take 0.4 mg by mouth daily.   torsemide 20 MG tablet Commonly known as: DEMADEX Take 3 tablets (60 mg total) by mouth 2 (two) times daily. Start taking on: December 28, 2022 What changed: These instructions start on December 28, 2022. If you are unsure what to do until then, ask your doctor or other care provider.   True Metrix Blood Glucose Test test strip Generic drug: glucose blood   zolpidem 5 MG tablet Commonly known as: AMBIEN Take 5 mg by mouth at bedtime. For insomnia.        Procedures/Studies: DG Chest Portable 1 View  Result Date: 12/22/2022 CLINICAL DATA:  [place] EXAM: PORTABLE CHEST 1 VIEW COMPARISON:  09/04/2022.  05/24/2022. FINDINGS: Pleuroparenchymal scarring at the left base is stable since prior and comparing back to 05/24/2022. Right lung clear. Cardiopericardial silhouette is at upper limits of normal for size. Bones are diffusely demineralized. Telemetry leads overlie the chest. IMPRESSION: Chronic pleuroparenchymal scarring at the left base. No acute cardiopulmonary findings. Electronically Signed   By: Kennith Center M.D.   On: 12/22/2022 14:44     Subjective: Pt reports he is feeling back to his baseline. He has had no further diarrhea and no bowel movements and no stool testing could be completed.  He says he has an aide and lots of support at  home and he is agreeable to home health services.    Discharge Exam: Vitals:   12/24/22 0833 12/24/22 0835  BP:    Pulse:    Resp:    Temp:    SpO2: 100% 100%   Vitals:   12/24/22 0417 12/24/22 0830 12/24/22 0833 12/24/22 0835  BP: (!) 117/57     Pulse: (!) 56     Resp: 18     Temp: (!) 96.9 F (36.1 C)     TempSrc:      SpO2: 98%  97% 100% 100%  Weight:      Height:        General: Pt is alert, awake, not in acute distress Cardiovascular: normal S1/S2 +, no rubs, no gallops Respiratory: CTA bilaterally, no wheezing, no rhonchi Abdominal: Soft, NT, ND, bowel sounds + Extremities: trace pretibial edema bilateral, no cyanosis   The results of significant diagnostics from this hospitalization (including imaging, microbiology, ancillary and laboratory) are listed below for reference.     Microbiology: No results found for this or any previous visit (from the past 240 hour(s)).   Labs: BNP (last 3 results) No results for input(s): "BNP" in the last 8760 hours. Basic Metabolic Panel: Recent Labs  Lab 12/22/22 1237 12/23/22 0502 12/24/22 0441  NA 133* 137 134*  K 3.5 3.1* 4.1  CL 99 105 100  CO2 23 25 25   GLUCOSE 189* 158* 186*  BUN 12 12 24*  CREATININE 1.24 1.06 2.08*  CALCIUM 8.7* 8.6* 8.5*  MG  --  1.7  --   PHOS  --  2.8  --    Liver Function Tests: Recent Labs  Lab 12/22/22 1237 12/23/22 0502  AST 18 8*  ALT 10 8  ALKPHOS 72 65  BILITOT 1.2 0.8  PROT 7.3 6.4*  ALBUMIN 3.5 3.1*   Recent Labs  Lab 12/22/22 1237  LIPASE 36   No results for input(s): "AMMONIA" in the last 168 hours. CBC: Recent Labs  Lab 12/22/22 1411 12/23/22 0502  WBC 7.6 7.1  NEUTROABS 5.9  --   HGB 15.7 15.0  HCT 48.1 45.9  MCV 82.8 84.5  PLT 157 161   Cardiac Enzymes: No results for input(s): "CKTOTAL", "CKMB", "CKMBINDEX", "TROPONINI" in the last 168 hours. BNP: Invalid input(s): "POCBNP" CBG: Recent Labs  Lab 12/23/22 0726 12/23/22 1117 12/23/22 1606 12/23/22 2200 12/24/22 0747  GLUCAP 158* 148* 230* 160* 177*   D-Dimer No results for input(s): "DDIMER" in the last 72 hours. Hgb A1c Recent Labs    12/22/22 0025  HGBA1C 9.3*   Lipid Profile No results for input(s): "CHOL", "HDL", "LDLCALC", "TRIG", "CHOLHDL", "LDLDIRECT" in the last 72 hours. Thyroid function studies No results for  input(s): "TSH", "T4TOTAL", "T3FREE", "THYROIDAB" in the last 72 hours.  Invalid input(s): "FREET3" Anemia work up No results for input(s): "VITAMINB12", "FOLATE", "FERRITIN", "TIBC", "IRON", "RETICCTPCT" in the last 72 hours. Urinalysis    Component Value Date/Time   COLORURINE YELLOW 12/22/2022 1925   APPEARANCEUR CLEAR 12/22/2022 1925   LABSPEC 1.022 12/22/2022 1925   PHURINE 6.0 12/22/2022 1925   GLUCOSEU 50 (A) 12/22/2022 1925   HGBUR NEGATIVE 12/22/2022 1925   BILIRUBINUR NEGATIVE 12/22/2022 1925   KETONESUR NEGATIVE 12/22/2022 1925   PROTEINUR >=300 (A) 12/22/2022 1925   UROBILINOGEN 0.2 02/28/2015 1310   NITRITE NEGATIVE 12/22/2022 1925   LEUKOCYTESUR NEGATIVE 12/22/2022 1925   Sepsis Labs Recent Labs  Lab 12/22/22 1411 12/23/22 0502  WBC 7.6 7.1   Microbiology No  results found for this or any previous visit (from the past 240 hour(s)).  Time coordinating discharge: 41 mins   SIGNED:  Standley Dakins, MD  [facility] 12/24/2022, 10:52 AM How to contact the [facility] Attending or Consulting provider 7A - 7P or covering provider during after hours 7P -7A, for this patient?  Check the care team in [facility] and look for a) attending/consulting [facility] provider listed and b) the [facility] team listed Log into [email or web address] and use [employer]'s universal password to access. If you do not have the password, please contact the hospital operator. Locate the [facility] provider you are looking for under [facility] and page to a number that you can be directly reached. If you still have difficulty reaching the provider, please page the [facility] (Director on Call) for the Hospitalists listed on amion for assistance.

## 2022-12-24 NOTE — Plan of Care (Signed)
  Problem: Acute Rehab OT Goals (only OT should resolve) Goal: Pt. Will Perform Grooming Flowsheets (Taken 12/24/2022 0930) Pt Will Perform Grooming:  with modified independence  standing Goal: Pt. Will Transfer To Toilet Flowsheets (Taken 12/24/2022 0930) Pt Will Transfer to Toilet:  with modified independence  ambulating Goal: Pt/Caregiver Will Perform Home Exercise Program Flowsheets (Taken 12/24/2022 0930) Pt/caregiver will Perform Home Exercise Program:  Increased strength  Both right and left upper extremity  Independently  Velera Lansdale OT, MOT

## 2022-12-24 NOTE — TOC Transition Note (Addendum)
Transition of Care [facility]) - CM/SW Discharge Note   Patient Details  Name: [name] MRN: [id number] Date of Birth: [birth year]  [facility]) CM/SW Contact:  Karn Cassis, LCSW Phone Number: 12/24/2022, 9:22 AM   Clinical Narrative: Pt d/c today. PT evaluated pt and recommend HHPT. Discussed with pt and pt agreeable with no preference on agency. Referred and accepted by [facility] with Frances Furbish. Orders in. No other needs reported.       Final next level of care: Home w Home Health Services Barriers to Discharge: Barriers Resolved   Patient Goals and CMS Choice   Choice offered to / list presented to : Patient  Discharge Placement                      Patient and family notified of of transfer: 12/24/22  Discharge Plan and Services Additional resources added to the After Visit Summary for                            [facility] Arranged: PT [facility] Agency: [facility] Date [facility] Agency Contacted: 12/24/22 Time HH Agency Contacted: [phone] Representative spoke with at [facility] Agency: Kandee Keen  Social Determinants of Health (SDOH) Interventions SDOH Screenings   Food Insecurity: No Food Insecurity (12/22/2022)  Housing: Low Risk  (12/22/2022)  Transportation Needs: No Transportation Needs (12/22/2022)  Utilities: Not At Risk (12/22/2022)  Depression (PHQ2-9): Low Risk  (04/24/2022)  Financial Resource Strain: Low Risk  (05/19/2019)  Physical Activity: Inactive (05/19/2019)  Social Connections: Unknown (05/19/2019)  Stress: No Stress Concern Present (05/19/2019)  Tobacco Use: High Risk (12/22/2022)     Readmission Risk Interventions     No data to display

## 2022-12-24 NOTE — Discharge Instructions (Addendum)
Please have your kidney function rechecked with primary care office in 1 week.  You can resume taking your diuretics and potassium on Monday 12/28/22    IMPORTANT INFORMATION: PAY CLOSE ATTENTION   PHYSICIAN DISCHARGE INSTRUCTIONS  Follow with Primary care provider  [name], [name], FNP  and other consultants as instructed by your Hospitalist Physician  SEEK MEDICAL CARE OR RETURN TO EMERGENCY ROOM IF SYMPTOMS COME BACK, WORSEN OR NEW PROBLEM DEVELOPS   Please note: You were cared for by a hospitalist during your hospital stay. Every effort will be made to forward records to your primary care provider.  You can request that your primary care provider send for your hospital records if they have not received them.  Once you are discharged, your primary care physician will handle any further medical issues. Please note that NO REFILLS for any discharge medications will be authorized once you are discharged, as it is imperative that you return to your primary care physician (or establish a relationship with a primary care physician if you do not have one) for your post hospital discharge needs so that they can reassess your need for medications and monitor your lab values.  Please get a complete blood count and chemistry panel checked by your Primary MD at your next visit, and again as instructed by your Primary MD.  Get Medicines reviewed and adjusted: Please take all your medications with you for your next visit with your Primary MD  Laboratory/radiological data: Please request your Primary MD to go over all hospital tests and procedure/radiological results at the follow up, please ask your primary care provider to get all Hospital records sent to his/her office.  In some cases, they will be blood work, cultures and biopsy results pending at the time of your discharge. Please request that your primary care provider follow up on these results.  If you are diabetic, please bring your blood sugar  readings with you to your follow up appointment with primary care.    Please call and make your follow up appointments as soon as possible.    Also Note the following: If you experience worsening of your admission symptoms, develop shortness of breath, life threatening emergency, suicidal or homicidal thoughts you must seek medical attention immediately by calling 911 or calling your MD immediately  if symptoms less severe.  You must read complete instructions/literature along with all the possible adverse reactions/side effects for all the Medicines you take and that have been prescribed to you. Take any new Medicines after you have completely understood and accpet all the possible adverse reactions/side effects.   Do not drive when taking Pain medications or sleeping medications (Benzodiazepines)  Do not take more than prescribed Pain, Sleep and Anxiety Medications. It is not advisable to combine anxiety,sleep and pain medications without talking with your primary care practitioner  Special Instructions: If you have smoked or chewed Tobacco  in the last 2 yrs please stop smoking, stop any regular Alcohol  and or any Recreational drug use.  Wear Seat belts while driving.  Do not drive if taking any narcotic, mind altering or controlled substances or recreational drugs or alcohol.

## 2022-12-24 NOTE — Evaluation (Addendum)
Occupational Therapy Evaluation Patient Details Name: [name] MRN: [id number] DOB: April 16, 1956 Today's Date: 12/24/2022   History of Present Illness [name] is a 67 y.o. male with medical history significant of chronic respiratory failure with hypoxemia on supplemental oxygen at 6LPM, COPD, type 2 diabetes mellitus with diabetic neuropathy, CAD s/p stent placement, essential hypertension, GERD, diastolic CHF hyperlipidemia, sleep apnea not on CPAP who presents to the emergency department due to 1 week onset of nausea, diarrhea, generalized weakness.  Diarrhea was up to 4 episodes of loose bowel movement daily since onset a week ago.  Diarrhea started after completing a course of doxycycline for left foot infection which is currently resolved.  He denies fever, chills, vomiting, abdominal pain, chest pain or shortness of breath   Clinical Impression   Pt agreeable to OT evaluation. Pt unsteady in standing with need for min G assist with RW. Pt is assisted at baseline for lower body ADL tasks. Generally weak but WDL A/ROM at the shoulder. Pt reports ability to have someone present during standing mobility for awhile at home. Pt does report blurry vision, but reports this has happened before. Noted slight nystagmus in L upper quadrant during visual tracking. Pt will benefit from continued OT in the hospital and recommended venue below to increase strength, balance, and endurance for safe ADL's.         Recommendations for follow up therapy are one component of a multi-disciplinary discharge planning process, led by the attending physician.  Recommendations may be updated based on patient status, additional functional criteria and insurance authorization.   Assistance Recommended at Discharge Intermittent Supervision/Assistance  Patient can return home with the following A little help with walking and/or transfers;A lot of help with bathing/dressing/bathroom;Assistance with  cooking/housework;Assist for transportation;Help with stairs or ramp for entrance    Functional Status Assessment  Patient has had a recent decline in their functional status and demonstrates the ability to make significant improvements in function in a reasonable and predictable amount of time.  Equipment Recommendations  None recommended by OT    Recommendations for Other Services Other (comment) (Follow up with an optometrist if vision remains blurry.)     Precautions / Restrictions Precautions Precautions: Fall Restrictions Weight Bearing Restrictions: No      Mobility Bed Mobility Overal bed mobility: Needs Assistance Bed Mobility: Supine to Sit     Supine to sit: Supervision, HOB elevated     General bed mobility comments: Mild labored effort; use of railing.    Transfers Overall transfer level: Needs assistance Equipment used: Rolling walker (2 wheels) Transfers: Bed to chair/wheelchair/BSC, Sit to/from Stand Sit to Stand: Min guard     Step pivot transfers: Min guard     General transfer comment: Labored and unsteady; mild shaking while standing.      Balance Overall balance assessment: Needs assistance Sitting-balance support: Feet supported, No upper extremity supported Sitting balance-Leahy Scale: Good Sitting balance - Comments: seated at EOB   Standing balance support: During functional activity, No upper extremity supported Standing balance-Leahy Scale: Poor Standing balance comment: fair using RW                           ADL either performed or assessed with clinical judgement   ADL Overall ADL's : Needs assistance/impaired     Grooming: Sitting;Set up   Upper Body Bathing: Set up;Sitting   Lower Body Bathing: Maximal assistance;Sitting/lateral leans   Upper Body Dressing :  Set up;Sitting   Lower Body Dressing: Maximal assistance;Sitting/lateral leans   Toilet Transfer: Min guard;Rolling walker (2  wheels);Ambulation Toilet Transfer Details (indicate cue type and reason): Simulated via EOB to chair transfer. Toileting- Clothing Manipulation and Hygiene: Set up;Sitting/lateral lean       Functional mobility during ADLs: Min guard;Rolling walker (2 wheels) General ADL Comments: Able to ambulate ~5 to 7 feet forward and backward from chair with RW.     Vision Baseline Vision/History: 1 Wears glasses Ability to See in Adequate Light: 2 Moderately impaired Patient Visual Report: Blurring of vision Vision Assessment?: Yes Tracking/Visual Pursuits: Other (comment) (L upper quadrant nystagmus noted briefly during tracking.) Additional Comments: Poor ability to read the board ~8 feet away. Pt reports this is not the first time he has had blurry vision.                Pertinent Vitals/Pain Pain Assessment Pain Assessment: No/denies pain     Hand Dominance Right   Extremity/Trunk Assessment Upper Extremity Assessment Upper Extremity Assessment: Generalized weakness   Lower Extremity Assessment Lower Extremity Assessment: Defer to PT evaluation   Cervical / Trunk Assessment Cervical / Trunk Assessment: Normal   Communication Communication Communication: No difficulties   Cognition Arousal/Alertness: Awake/alert Behavior During Therapy: WFL for tasks assessed/performed Overall Cognitive Status: Within Functional Limits for tasks assessed                                                        Home Living Family/patient expects to be discharged to:: Private residence Living Arrangements: Parent Available Help at Discharge: Family;Personal care attendant;Available PRN/intermittently Type of Home: Mobile home Home Access: Stairs to enter;Ramped entrance     Home Layout: One level     Bathroom Shower/Tub: Producer, television/film/video: Handicapped height Bathroom Accessibility: Yes   Home Equipment: Agricultural consultant (2 wheels);Rollator (4  wheels);Shower seat;BSC/3in1;Wheelchair - manual;Electric scooter;Hospital bed          Prior Functioning/Environment Prior Level of Function : Needs assist       Physical Assist : ADLs (physical)   ADLs (physical): IADLs;Bathing;Dressing Mobility Comments: household ambulator using SPC PRN, or RW for longer distances ADLs Comments: Home aides 4 hours/day x 5 days/week; assisted for bathing, dressing, and IADL's.        OT Problem List: Decreased strength;Decreased activity tolerance;Impaired balance (sitting and/or standing)      OT Treatment/Interventions: Self-care/ADL training;Therapeutic exercise;Therapeutic activities;Balance training    OT Goals(Current goals can be found in the care plan section) Acute Rehab OT Goals Patient Stated Goal: return home OT Goal Formulation: With patient Time For Goal Achievement: 01/07/23 Potential to Achieve Goals: Good  OT Frequency: Min 1X/week                  AM-PAC OT "6 Clicks" Daily Activity     Outcome Measure Help from another person eating meals?: None Help from another person taking care of personal grooming?: A Little Help from another person toileting, which includes using toliet, bedpan, or urinal?: A Little Help from another person bathing (including washing, rinsing, drying)?: A Lot Help from another person to put on and taking off regular upper body clothing?: A Little Help from another person to put on and taking off regular lower body clothing?: A Lot 6 Click Score:  17   End of Session Equipment Utilized During Treatment: Rolling walker (2 wheels);Oxygen  Activity Tolerance: Patient tolerated treatment well Patient left: in chair;with call bell/phone within reach  OT Visit Diagnosis: Unsteadiness on feet (R26.81);Other abnormalities of gait and mobility (R26.89);Muscle weakness (generalized) (M62.81)                Time: 6045-4098 OT Time Calculation (min): 13 min Charges:  OT General Charges $OT  Visit: 1 Visit OT Evaluation $OT Eval Low Complexity: 1 Low  [name] OT, MOT  [name] 12/24/2022, 9:27 AM

## 2022-12-27 ENCOUNTER — Other Ambulatory Visit: Payer: Self-pay

## 2022-12-27 ENCOUNTER — Emergency Department (HOSPITAL_BASED_OUTPATIENT_CLINIC_OR_DEPARTMENT_OTHER): Payer: 59

## 2022-12-27 ENCOUNTER — Encounter (HOSPITAL_BASED_OUTPATIENT_CLINIC_OR_DEPARTMENT_OTHER): Payer: Self-pay | Admitting: Emergency Medicine

## 2022-12-27 ENCOUNTER — Emergency Department (HOSPITAL_BASED_OUTPATIENT_CLINIC_OR_DEPARTMENT_OTHER)
Admission: EM | Admit: 2022-12-27 | Discharge: 2022-12-27 | Disposition: A | Discharge status: Home / Self Care | Payer: 59 | Attending: Emergency Medicine | Admitting: Emergency Medicine

## 2022-12-27 DIAGNOSIS — S0993XA Unspecified injury of face, initial encounter: Secondary | ICD-10-CM | POA: Diagnosis not present

## 2022-12-27 DIAGNOSIS — E119 Type 2 diabetes mellitus without complications: Secondary | ICD-10-CM | POA: Diagnosis not present

## 2022-12-27 DIAGNOSIS — Z79899 Other long term (current) drug therapy: Secondary | ICD-10-CM | POA: Diagnosis not present

## 2022-12-27 DIAGNOSIS — S43085A Other dislocation of left shoulder joint, initial encounter: Secondary | ICD-10-CM | POA: Insufficient documentation

## 2022-12-27 DIAGNOSIS — Z8673 Personal history of transient ischemic attack (TIA), and cerebral infarction without residual deficits: Secondary | ICD-10-CM | POA: Insufficient documentation

## 2022-12-27 DIAGNOSIS — S0011XA Contusion of right eyelid and periocular area, initial encounter: Secondary | ICD-10-CM | POA: Diagnosis not present

## 2022-12-27 DIAGNOSIS — S43005A Unspecified dislocation of left shoulder joint, initial encounter: Secondary | ICD-10-CM | POA: Diagnosis not present

## 2022-12-27 DIAGNOSIS — I2510 Atherosclerotic heart disease of native coronary artery without angina pectoris: Secondary | ICD-10-CM | POA: Insufficient documentation

## 2022-12-27 DIAGNOSIS — S199XXA Unspecified injury of neck, initial encounter: Secondary | ICD-10-CM | POA: Diagnosis not present

## 2022-12-27 DIAGNOSIS — S43015A Anterior dislocation of left humerus, initial encounter: Secondary | ICD-10-CM | POA: Diagnosis not present

## 2022-12-27 DIAGNOSIS — I10 Essential (primary) hypertension: Secondary | ICD-10-CM | POA: Diagnosis not present

## 2022-12-27 DIAGNOSIS — Z7984 Long term (current) use of oral hypoglycemic drugs: Secondary | ICD-10-CM | POA: Insufficient documentation

## 2022-12-27 DIAGNOSIS — J449 Chronic obstructive pulmonary disease, unspecified: Secondary | ICD-10-CM | POA: Insufficient documentation

## 2022-12-27 DIAGNOSIS — Z794 Long term (current) use of insulin: Secondary | ICD-10-CM | POA: Diagnosis not present

## 2022-12-27 DIAGNOSIS — Z7902 Long term (current) use of antithrombotics/antiplatelets: Secondary | ICD-10-CM | POA: Insufficient documentation

## 2022-12-27 DIAGNOSIS — R519 Headache, unspecified: Secondary | ICD-10-CM | POA: Diagnosis not present

## 2022-12-27 DIAGNOSIS — M25512 Pain in left shoulder: Secondary | ICD-10-CM | POA: Diagnosis present

## 2022-12-27 DIAGNOSIS — S0990XA Unspecified injury of head, initial encounter: Secondary | ICD-10-CM | POA: Diagnosis not present

## 2022-12-27 LAB — BASIC METABOLIC PANEL
Anion gap: 9 (ref 5–15)
BUN: 24 mg/dL — ABNORMAL HIGH (ref 8–23)
CO2: 28 mmol/L (ref 22–32)
Calcium: 9.5 mg/dL (ref 8.9–10.3)
Chloride: 98 mmol/L (ref 98–111)
Creatinine, Ser: 1.57 mg/dL — ABNORMAL HIGH (ref 0.61–1.24)
GFR, Estimated: 48 mL/min — ABNORMAL LOW (ref >60)
Glucose, Bld: 223 mg/dL — ABNORMAL HIGH (ref 70–99)
Potassium: 4.4 mmol/L (ref 3.5–5.1)
Sodium: 135 mmol/L (ref 135–145)

## 2022-12-27 LAB — CBC
HCT: 40.7 % (ref 39.0–52.0)
Hemoglobin: 13.7 g/dL (ref 13.0–17.0)
MCH: 27.7 pg (ref 26.0–34.0)
MCHC: 33.7 g/dL (ref 30.0–36.0)
MCV: 82.2 fL (ref 80.0–100.0)
Platelets: 159 10*3/uL (ref 150–400)
RBC: 4.95 MIL/uL (ref 4.22–5.81)
RDW: 13.4 % (ref 11.5–15.5)
WBC: 8.5 10*3/uL (ref 4.0–10.5)
nRBC: 0 % (ref 0.0–0.2)

## 2022-12-27 MED ORDER — HYDROMORPHONE HCL 1 MG/ML IJ SOLN
0.5000 mg | Freq: Once | INTRAMUSCULAR | Status: AC
  Administered 2022-12-27: 0.5 mg via INTRAVENOUS
  Filled 2022-12-27: qty 1

## 2022-12-27 MED ORDER — FENTANYL CITRATE PF 50 MCG/ML IJ SOSY: 50.0000 ug | PREFILLED_SYRINGE | Freq: Once | INTRAMUSCULAR | Status: DC
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
## 2022-12-27 NOTE — ED Notes (Signed)
Pt reports to nurse that he wants to go home and does not want to be admitted to the hospital. RN notified Dr. Rubin Payor. Pt stated same to MD. Reports he is aware of the risks of leaving AMA but still wants to go home.

## 2022-12-27 NOTE — Discharge Instructions (Signed)
You are leaving against our advice.  This could cause serious problems with your shoulder including loss of use of it.  Feel free to return for further treatment.  Follow-up with orthopedic surgery.
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
## 2022-12-27 NOTE — ED Notes (Signed)
Discharge instructions discussed with pt. Pt verbalized understanding. Pt stable and ambulatory. Aware that he is leaving against medical advice and aware of the risks

## 2022-12-28 DIAGNOSIS — S43005A Unspecified dislocation of left shoulder joint, initial encounter: Secondary | ICD-10-CM | POA: Diagnosis not present

## 2022-12-30 ENCOUNTER — Ambulatory Visit: Payer: 59 | Admitting: Podiatry

## 2023-01-05 ENCOUNTER — Telehealth: Payer: Self-pay | Admitting: Cardiology

## 2023-01-05 ENCOUNTER — Telehealth: Payer: Self-pay | Admitting: Nurse Practitioner

## 2023-01-05 DIAGNOSIS — S43005A Unspecified dislocation of left shoulder joint, initial encounter: Secondary | ICD-10-CM | POA: Diagnosis not present

## 2023-01-05 DIAGNOSIS — S43085A Other dislocation of left shoulder joint, initial encounter: Secondary | ICD-10-CM | POA: Diagnosis not present

## 2023-01-05 NOTE — Telephone Encounter (Signed)
   Name: ANGELDANIEL PEPITONE  DOB: 27-Feb-1956  MRN: [id number]  Primary Cardiologist: Nona Dell, MD  Chart reviewed as part of pre-operative protocol coverage. Because of Adetokunbo Bollenbach Staller's past medical history and time since last visit, he will require a follow-up in-office visit in order to better assess preoperative cardiovascular risk.  Pre-op covering staff: - Please schedule appointment and call patient to inform them. If patient already had an upcoming appointment within acceptable timeframe, please add "pre-op clearance" to the appointment notes so provider is aware. - Please contact requesting surgeon's office via preferred method (i.e, phone, fax) to inform them of need for appointment prior to surgery.   Joylene Grapes, NP  01/05/2023, 4:22 PM

## 2023-01-05 NOTE — Telephone Encounter (Signed)
   Pre-operative Risk Assessment    Patient Name: [name]  DOB: 1955/08/22 MRN: [id number]      Request for Surgical Clearance    Procedure:   Left Reverse Total Shoulder Replacement VS Hemi Proximal Humerus for fracture   Date of Surgery:  Clearance 01/13/23                                 Surgeon:  Mariea Clonts  Surgeon's Group or Practice Name:  [facility] Phone number:  [phone] Fax number:  [phone]   Type of Clearance Requested:   - Pharmacy:  Hold Clopidogrel (Plavix) ?   Type of Anesthesia:  General    Additional requests/questions:    Lajuana Matte   01/05/2023, 3:45 PM

## 2023-01-05 NOTE — Telephone Encounter (Signed)
I will send a message to our [facility] scheduling team to see if they may be able to find an appt for the pt in time for pre op clearance.   Left Reverse Total Shoulder Replacement VS Hemi Proximal Humerus for fracture. Surgery date is 01/13/23

## 2023-01-05 NOTE — Telephone Encounter (Signed)
Fax received from Dr. Ramond Marrow with [facility] to perform a Left Reverse Total Shoulder Replacement vs Hemi Proximal Humerus for Fracture on patient.  Patient needs surgery clearance. Surgery is 01/13/23. Patient was seen on 12/10/22. Office protocol is a risk assessment can be sent to surgeon if patient has been seen in 60 days or less.   Sending to [employer] for risk assessment or recommendations if patient needs to be seen in office prior to surgical procedure.

## 2023-01-05 NOTE — Progress Notes (Signed)
COVID Vaccine received:  []  No []  Yes Date of any COVID positive Test in last 90 days:  PCP - Kae Heller FNP Cardiologist - Dr. Nona Dell  Chest x-ray - 12/22/22  EPIC EKG -  06/26/22  EPIC Stress Test -  ECHO - 12/06/20  EPIC Cardiac Cath -   Bowel Prep - [x]  No  []   Yes ______  Pacemaker / ICD device []  No []  Yes   Spinal Cord Stimulator:[]  No []  Yes       History of Sleep Apnea? []  No []  Yes   CPAP used?- []  No []  Yes    Does the patient monitor blood sugar?          []  No []  Yes  []  N/A  Patient has: []  NO Hx DM   []  Pre-DM                 []  DM1  []   DM2 Does patient have a [employer] or Dexacom? []  No []  Yes   Fasting Blood Sugar Ranges-  Checks Blood Sugar _____ times a day  GLP1 agonist / usual dose -  GLP1 instructions:  SGLT-2 inhibitors / usual dose -  SGLT-2 instructions:   Blood Thinner / Instructions: Aspirin Instructions:  Comments:   Activity level: Patient is able / unable to climb a flight of stairs without difficulty; []  No CP  []  No SOB, but would have ___   Patient can / can not perform ADLs without assistance.   Anesthesia review:   Patient denies shortness of breath, fever, cough and chest pain at PAT appointment.  Patient verbalized understanding and agreement to the Pre-Surgical Instructions that were given to them at this PAT appointment. Patient was also educated of the need to review these PAT instructions again prior to his/her surgery.I reviewed the appropriate phone numbers to call if they have any and questions or concerns.

## 2023-01-05 NOTE — Patient Instructions (Signed)
SURGICAL WAITING ROOM VISITATION  Patients having surgery or a procedure may have no more than 2 support people in the waiting area - these visitors may rotate.    Children under the age of 55 must have an adult with them who is not the patient.  Due to an increase in RSV and influenza rates and associated hospitalizations, children ages 18 and under may not visit patients in [facility] hospitals.  If the patient needs to stay at the hospital during part of their recovery, the visitor guidelines for inpatient rooms apply. Pre-op nurse will coordinate an appropriate time for 1 support person to accompany patient in pre-op.  This support person may not rotate.    Please refer to the [facility] website for the visitor guidelines for Inpatients (after your surgery is over and you are in a regular room).       Your procedure is scheduled on: 01/13/23   Report to [facility] Main Entrance    Report to admitting at 12:30PM   Call this number if you have problems the morning of surgery [phone]   Do not eat food or drink liquids :After Midnight.    questions, please contact your surgeon's office.     Oral Hygiene is also important to reduce your risk of infection.                                    Remember - BRUSH YOUR TEETH THE MORNING OF SURGERY WITH YOUR REGULAR TOOTHPASTE  DENTURES WILL BE REMOVED PRIOR TO SURGERY PLEASE DO NOT APPLY "Poly grip" OR ADHESIVES!!!   Do NOT smoke after Midnight   Take these medicines the morning of surgery with A SIP OF WATER: Tylenol, Xanax, Cardizem, Nexium, Prozac, Imdur, Lyrica,   DO NOT TAKE ANY ORAL DIABETIC MEDICATIONS DAY OF YOUR SURGERY  Bring CPAP mask and tubing day of surgery.                              You may not have any metal on your body including hair pins, jewelry, and body piercing             Do not wear make-up, lotions, powders, perfumes/cologne, or deodorant  Do not wear nail polish including gel and S&S,  artificial/acrylic nails, or any other type of covering on natural nails including finger and toenails. If you have artificial nails, gel coating, etc. that needs to be removed by a nail salon please have this removed prior to surgery or surgery may need to be canceled/ delayed if the surgeon/ anesthesia feels like they are unable to be safely monitored.   Do not shave  48 hours prior to surgery.               Men may shave face and neck.   Do not bring valuables to the hospital. [place] IS NOT             RESPONSIBLE   FOR VALUABLES.   Contacts, glasses, dentures or bridgework may not be worn into surgery.   Bring small overnight bag day of surgery.   DO NOT BRING YOUR HOME MEDICATIONS TO THE HOSPITAL. PHARMACY WILL DISPENSE MEDICATIONS LISTED ON YOUR MEDICATION LIST TO YOU DURING YOUR ADMISSION IN THE HOSPITAL!    Patients discharged on the day of surgery will not be allowed  to drive home.  Someone NEEDS to stay with you for the first 24 hours after anesthesia.   Special Instructions: Bring a copy of your healthcare power of attorney and living will documents the day of surgery if you haven't scanned them before.              Please read over the following fact sheets you were given: IF YOU HAVE QUESTIONS ABOUT YOUR PRE-OP INSTRUCTIONS PLEASE CALL [phone]   If you received a COVID test during your pre-op visit  it is requested that you wear a mask when out in public, stay away from anyone that may not be feeling well and notify your surgeon if you develop symptoms. If you test positive for Covid or have been in contact with anyone that has tested positive in the last 10 days please notify you surgeon.      Pre-operative 5 CHG Bath Instructions   You can play a key role in reducing the risk of infection after surgery. Your skin needs to be as free of germs as possible. You can reduce the number of germs on your skin by washing with CHG (chlorhexidine gluconate) soap before  surgery. CHG is an antiseptic soap that kills germs and continues to kill germs even after washing.   DO NOT use if you have an allergy to chlorhexidine/CHG or antibacterial soaps. If your skin becomes reddened or irritated, stop using the CHG and notify one of our RNs at [phone].   Please shower with the CHG soap starting 4 days before surgery using the following schedule:     Please keep in mind the following:  DO NOT shave, including legs and underarms, starting the day of your first shower.   You may shave your face at any point before/day of surgery.  Place clean sheets on your bed the day you start using CHG soap. Use a clean washcloth (not used since being washed) for each shower. DO NOT sleep with pets once you start using the CHG.   CHG Shower Instructions:  If you choose to wash your hair and private area, wash first with your normal shampoo/soap.  After you use shampoo/soap, rinse your hair and body thoroughly to remove shampoo/soap residue.  Turn the water OFF and apply about 3 tablespoons (45 ml) of CHG soap to a CLEAN washcloth.  Apply CHG soap ONLY FROM YOUR NECK DOWN TO YOUR TOES (washing for 3-5 minutes)  DO NOT use CHG soap on face, private areas, open wounds, or sores.  Pay special attention to the area where your surgery is being performed.  If you are having back surgery, having someone wash your back for you may be helpful. Wait 2 minutes after CHG soap is applied, then you may rinse off the CHG soap.  Pat dry with a clean towel  Put on clean clothes/pajamas   If you choose to wear lotion, please use ONLY the CHG-compatible lotions on the back of this paper.     Additional instructions for the day of surgery: DO NOT APPLY any lotions, deodorants, cologne, or perfumes.   Put on clean/comfortable clothes.  Brush your teeth.  Ask your nurse before applying any prescription medications to the skin.      CHG Compatible Lotions   Aveeno Moisturizing  lotion  Cetaphil Moisturizing Cream  Cetaphil Moisturizing Lotion  Clairol Herbal Essence Moisturizing Lotion, Dry Skin  Clairol Herbal Essence Moisturizing Lotion, Extra Dry Skin  Clairol Herbal Essence Moisturizing Lotion, Normal Skin  Curel Age Defying Therapeutic Moisturizing Lotion with Alpha Hydroxy  Curel Extreme Care Body Lotion  Curel Soothing Hands Moisturizing Hand Lotion  Curel Therapeutic Moisturizing Cream, Fragrance-Free  Curel Therapeutic Moisturizing Lotion, Fragrance-Free  Curel Therapeutic Moisturizing Lotion, Original Formula  Eucerin Daily Replenishing Lotion  Eucerin Dry Skin Therapy Plus Alpha Hydroxy Crme  Eucerin Dry Skin Therapy Plus Alpha Hydroxy Lotion  Eucerin Original Crme  Eucerin Original Lotion  Eucerin Plus Crme Eucerin Plus Lotion  Eucerin TriLipid Replenishing Lotion  Keri Anti-Bacterial Hand Lotion  Keri Deep Conditioning Original Lotion Dry Skin Formula Softly Scented  Keri Deep Conditioning Original Lotion, Fragrance Free Sensitive Skin Formula  Keri Lotion Fast Absorbing Fragrance Free Sensitive Skin Formula  Keri Lotion Fast Absorbing Softly Scented Dry Skin Formula  Keri Original Lotion  Keri Skin Renewal Lotion Keri Silky Smooth Lotion  Keri Silky Smooth Sensitive Skin Lotion  Nivea Body Creamy Conditioning Oil  Nivea Body Extra Enriched Lotion  Nivea Body Original Lotion  Nivea Body Sheer Moisturizing Lotion Nivea Crme  Nivea Skin Firming Lotion  NutraDerm 30 Skin Lotion  NutraDerm Skin Lotion  NutraDerm Therapeutic Skin Cream  NutraDerm Therapeutic Skin Lotion  ProShield Protective Hand Cream    How to Manage Your Diabetes Before and After Surgery  Why is it important to control my blood sugar before and after surgery? Improving blood sugar levels before and after surgery helps healing and can limit problems. A way of improving blood sugar control is eating a healthy diet by:  Eating less sugar and carbohydrates   Increasing activity/exercise  Talking with your doctor about reaching your blood sugar goals High blood sugars (greater than 180 mg/dL) can raise your risk of infections and slow your recovery, so you will need to focus on controlling your diabetes during the weeks before surgery. Make sure that the doctor who takes care of your diabetes knows about your planned surgery including the date and location.  How do I manage my blood sugar before surgery? Check your blood sugar at least 4 times a day, starting 2 days before surgery, to make sure that the level is not too high or low. Check your blood sugar the morning of your surgery when you wake up and every 2 hours until you get to the Short Stay unit. If your blood sugar is less than 70 mg/dL, you will need to treat for low blood sugar: Do not take insulin. Treat a low blood sugar (less than 70 mg/dL) with  cup of clear juice (cranberry or apple), 4 glucose tablets, OR glucose gel. Recheck blood sugar in 15 minutes after treatment (to make sure it is greater than 70 mg/dL). If your blood sugar is not greater than 70 mg/dL on recheck, call [phone] for further instructions. Report your blood sugar to the short stay nurse when you get to Short Stay.  If you are admitted to the hospital after surgery: Your blood sugar will be checked by the staff and you will probably be given insulin after surgery (instead of oral diabetes medicines) to make sure you have good blood sugar levels. The goal for blood sugar control after surgery is 80-180 mg/dL.   WHAT DO I DO ABOUT MY DIABETES MEDICATION?  Do not take oral diabetes medicines (pills) the morning of surgery.  THE NIGHT BEFORE SURGERY, DO NOT TAKE EVENING DOSE OF INSULIN       May take Tradjenta the day before surgery but do not take it the day of surgery.  THE  MORNING OF SURGERY,May take half of regular insulin only if CBG greater than 220.  DO NOT TAKE THE FOLLOWING 7 DAYS PRIOR TO  SURGERY: Ozempic, Wegovy, Rybelsus (Semaglutide), Byetta (exenatide), Bydureon (exenatide ER), Victoza, Saxenda (liraglutide), or Trulicity (dulaglutide) Mounjaro (Tirzepatide) Adlyxin (Lixisenatide), Polyethylene Glycol Loxenatide.  If your CBG is greater than 220 mg/dL, you may take  of your sliding scale  (correction) dose of insulin.    INCENTIVE SPIROMETER An incentive spirometer is a tool that can help keep your lungs clear and active. This tool measures how well you are filling your lungs with each breath. Taking long deep breaths may help reverse or decrease the chance of developing breathing (pulmonary) problems (especially infection) following: A long period of time when you are unable to move or be active. BEFORE THE PROCEDURE  If the spirometer includes an indicator to show your best effort, your nurse or respiratory therapist will set it to a desired goal. If possible, sit up straight or lean slightly forward. Try not to slouch. Hold the incentive spirometer in an upright position. INSTRUCTIONS FOR USE  Sit on the edge of your bed if possible, or sit up as far as you can in bed or on a chair. Hold the incentive spirometer in an upright position. Breathe out normally. Place the mouthpiece in your mouth and seal your lips tightly around it. Breathe in slowly and as deeply as possible, raising the piston or the ball toward the top of the column. Hold your breath for 3-5 seconds or for as long as possible. Allow the piston or ball to fall to the bottom of the column. Remove the mouthpiece from your mouth and breathe out normally. Rest for a few seconds and repeat Steps 1 through 7 at least 10 times every 1-2 hours when you are awake. Take your time and take a few normal breaths between deep breaths. The spirometer may include an indicator to show your best effort. Use the indicator as a goal to work toward during each repetition. After each set of 10 deep breaths, practice coughing to  be sure your lungs are clear. If you have an incision (the cut made at the time of surgery), support your incision when coughing by placing a pillow or rolled up towels firmly against it. Once you are able to get out of bed, walk around indoors and cough well. You may stop using the incentive spirometer when instructed by your caregiver.  RISKS AND COMPLICATIONS Take your time so you do not get dizzy or light-headed. If you are in pain, you may need to take or ask for pain medication before doing incentive spirometry. It is harder to take a deep breath if you are having pain. AFTER USE Rest and breathe slowly and easily. It can be helpful to keep track of a log of your progress. Your caregiver can provide you with a simple table to help with this. If you are using the spirometer at home, follow these instructions: SEEK MEDICAL CARE IF:  You are having difficultly using the spirometer. You have trouble using the spirometer as often as instructed. Your pain medication is not giving enough relief while using the spirometer. You develop fever of 100.5 F (38.1 C) or higher. SEEK IMMEDIATE MEDICAL CARE IF:  You cough up bloody sputum that had not been present before. You develop fever of 102 F (38.9 C) or greater. You develop worsening pain at or near the incision site. MAKE SURE YOU:  Understand these instructions.  Will watch your condition. Will get help right away if you are not doing well or get worse. Document Released: 11/16/2006 Document Revised: 09/28/2011 Document Reviewed: 01/17/2007 [name] Patient Information 2014 [place], [place] [facility]- Preparing for Total Shoulder Arthroplasty    Before surgery, you can play an important role. Because skin is not sterile, your skin needs to be as free of germs as possible. You can reduce the number of germs on your skin by using the following products. Benzoyl Peroxide Gel Reduces the number of germs present on the skin Applied twice a  day to shoulder area starting two days before surgery    ==================================================================  Please follow these instructions carefully:  BENZOYL PEROXIDE 5% GEL  Please do not use if you have an allergy to benzoyl peroxide.   If your skin becomes reddened/irritated stop using the benzoyl peroxide.  Starting two days before surgery, apply as follows: Apply benzoyl peroxide in the morning and at night. Apply after taking a shower. If you are not taking a shower clean entire shoulder front, back, and side along with the armpit with a clean wet washcloth.  Place a quarter-sized dollop on your shoulder and rub in thoroughly, making sure to cover the front, back, and side of your shoulder, along with the armpit.   2 days before ____ AM   ____ PM              1 day before ____ AM   ____ PM                         Do this twice a day for two days.  (Last application is the night before surgery, AFTER using the CHG soap as described below).  Do NOT apply benzoyl peroxide gel on the day of surgery.

## 2023-01-06 NOTE — Telephone Encounter (Signed)
Pt was offered to see Dr. Dot Been, 01/07/23, but pt couldn't make it Next available is 01/20/23 8:30 with Sharlene Dory, NP.  Per pt, this is ok, that he will have to reschedule his surgery.  Will route to the requesting surgeon's office to make them aware.

## 2023-01-07 ENCOUNTER — Inpatient Hospital Stay (HOSPITAL_COMMUNITY)
Admission: RE | Admit: 2023-01-07 | Discharge: 2023-01-07 | Disposition: A | Discharge status: Home / Self Care | Payer: 59 | Source: Ambulatory Visit

## 2023-01-07 DIAGNOSIS — Z01818 Encounter for other preprocedural examination: Secondary | ICD-10-CM

## 2023-01-07 DIAGNOSIS — E119 Type 2 diabetes mellitus without complications: Secondary | ICD-10-CM

## 2023-01-07 DIAGNOSIS — I10 Essential (primary) hypertension: Secondary | ICD-10-CM

## 2023-01-07 NOTE — Telephone Encounter (Signed)
Will update all parties involved. Pt has appt 01/20/23 with Sharlene Dory, NP. See notes pt states he will reschedule his procedure.

## 2023-01-12 ENCOUNTER — Other Ambulatory Visit: Payer: Self-pay

## 2023-01-12 ENCOUNTER — Encounter (HOSPITAL_COMMUNITY): Payer: Self-pay

## 2023-01-12 ENCOUNTER — Inpatient Hospital Stay (HOSPITAL_COMMUNITY)
Admission: AD | Admit: 2023-01-12 | Discharge: 2023-01-19 | DRG: 483 | Disposition: A | Discharge status: Home Health | Payer: 59 | Source: Ambulatory Visit | Attending: Family Medicine | Admitting: Family Medicine

## 2023-01-12 ENCOUNTER — Inpatient Hospital Stay (HOSPITAL_COMMUNITY): Payer: 59

## 2023-01-12 DIAGNOSIS — Z8249 Family history of ischemic heart disease and other diseases of the circulatory system: Secondary | ICD-10-CM

## 2023-01-12 DIAGNOSIS — S143XXA Injury of brachial plexus, initial encounter: Secondary | ICD-10-CM | POA: Diagnosis not present

## 2023-01-12 DIAGNOSIS — S42402A Unspecified fracture of lower end of left humerus, initial encounter for closed fracture: Secondary | ICD-10-CM | POA: Diagnosis not present

## 2023-01-12 DIAGNOSIS — Z01818 Encounter for other preprocedural examination: Secondary | ICD-10-CM

## 2023-01-12 DIAGNOSIS — S42402D Unspecified fracture of lower end of left humerus, subsequent encounter for fracture with routine healing: Secondary | ICD-10-CM | POA: Diagnosis not present

## 2023-01-12 DIAGNOSIS — Z85528 Personal history of other malignant neoplasm of kidney: Secondary | ICD-10-CM

## 2023-01-12 DIAGNOSIS — S42309A Unspecified fracture of shaft of humerus, unspecified arm, initial encounter for closed fracture: Secondary | ICD-10-CM | POA: Diagnosis not present

## 2023-01-12 DIAGNOSIS — S43015A Anterior dislocation of left humerus, initial encounter: Secondary | ICD-10-CM | POA: Diagnosis not present

## 2023-01-12 DIAGNOSIS — F17210 Nicotine dependence, cigarettes, uncomplicated: Secondary | ICD-10-CM | POA: Diagnosis present

## 2023-01-12 DIAGNOSIS — E1151 Type 2 diabetes mellitus with diabetic peripheral angiopathy without gangrene: Secondary | ICD-10-CM | POA: Diagnosis present

## 2023-01-12 DIAGNOSIS — J9611 Chronic respiratory failure with hypoxia: Secondary | ICD-10-CM | POA: Diagnosis present

## 2023-01-12 DIAGNOSIS — N1831 Chronic kidney disease, stage 3a: Secondary | ICD-10-CM | POA: Diagnosis present

## 2023-01-12 DIAGNOSIS — Z888 Allergy status to other drugs, medicaments and biological substances status: Secondary | ICD-10-CM

## 2023-01-12 DIAGNOSIS — I252 Old myocardial infarction: Secondary | ICD-10-CM

## 2023-01-12 DIAGNOSIS — E785 Hyperlipidemia, unspecified: Secondary | ICD-10-CM | POA: Diagnosis present

## 2023-01-12 DIAGNOSIS — Z9981 Dependence on supplemental oxygen: Secondary | ICD-10-CM

## 2023-01-12 DIAGNOSIS — Z66 Do not resuscitate: Secondary | ICD-10-CM | POA: Diagnosis not present

## 2023-01-12 DIAGNOSIS — G4730 Sleep apnea, unspecified: Secondary | ICD-10-CM | POA: Diagnosis present

## 2023-01-12 DIAGNOSIS — E1142 Type 2 diabetes mellitus with diabetic polyneuropathy: Secondary | ICD-10-CM | POA: Diagnosis present

## 2023-01-12 DIAGNOSIS — G588 Other specified mononeuropathies: Secondary | ICD-10-CM | POA: Diagnosis present

## 2023-01-12 DIAGNOSIS — R339 Retention of urine, unspecified: Secondary | ICD-10-CM | POA: Diagnosis not present

## 2023-01-12 DIAGNOSIS — Z794 Long term (current) use of insulin: Secondary | ICD-10-CM | POA: Diagnosis not present

## 2023-01-12 DIAGNOSIS — E876 Hypokalemia: Secondary | ICD-10-CM | POA: Diagnosis not present

## 2023-01-12 DIAGNOSIS — R0689 Other abnormalities of breathing: Secondary | ICD-10-CM | POA: Diagnosis not present

## 2023-01-12 DIAGNOSIS — S43005A Unspecified dislocation of left shoulder joint, initial encounter: Secondary | ICD-10-CM | POA: Diagnosis not present

## 2023-01-12 DIAGNOSIS — F32A Depression, unspecified: Secondary | ICD-10-CM | POA: Diagnosis present

## 2023-01-12 DIAGNOSIS — Z716 Tobacco abuse counseling: Secondary | ICD-10-CM

## 2023-01-12 DIAGNOSIS — I25118 Atherosclerotic heart disease of native coronary artery with other forms of angina pectoris: Secondary | ICD-10-CM | POA: Diagnosis present

## 2023-01-12 DIAGNOSIS — Z01810 Encounter for preprocedural cardiovascular examination: Secondary | ICD-10-CM | POA: Diagnosis not present

## 2023-01-12 DIAGNOSIS — K219 Gastro-esophageal reflux disease without esophagitis: Secondary | ICD-10-CM | POA: Diagnosis present

## 2023-01-12 DIAGNOSIS — X58XXXA Exposure to other specified factors, initial encounter: Secondary | ICD-10-CM | POA: Diagnosis present

## 2023-01-12 DIAGNOSIS — Z79899 Other long term (current) drug therapy: Secondary | ICD-10-CM

## 2023-01-12 DIAGNOSIS — I2089 Other forms of angina pectoris: Secondary | ICD-10-CM | POA: Diagnosis not present

## 2023-01-12 DIAGNOSIS — Z833 Family history of diabetes mellitus: Secondary | ICD-10-CM

## 2023-01-12 DIAGNOSIS — I739 Peripheral vascular disease, unspecified: Secondary | ICD-10-CM | POA: Diagnosis not present

## 2023-01-12 DIAGNOSIS — J929 Pleural plaque without asbestos: Secondary | ICD-10-CM | POA: Diagnosis not present

## 2023-01-12 DIAGNOSIS — R338 Other retention of urine: Secondary | ICD-10-CM | POA: Diagnosis not present

## 2023-01-12 DIAGNOSIS — S42412G Displaced simple supracondylar fracture without intercondylar fracture of left humerus, subsequent encounter for fracture with delayed healing: Secondary | ICD-10-CM | POA: Diagnosis not present

## 2023-01-12 DIAGNOSIS — Z823 Family history of stroke: Secondary | ICD-10-CM

## 2023-01-12 DIAGNOSIS — I951 Orthostatic hypotension: Secondary | ICD-10-CM | POA: Diagnosis not present

## 2023-01-12 DIAGNOSIS — I129 Hypertensive chronic kidney disease with stage 1 through stage 4 chronic kidney disease, or unspecified chronic kidney disease: Secondary | ICD-10-CM | POA: Diagnosis not present

## 2023-01-12 DIAGNOSIS — D649 Anemia, unspecified: Secondary | ICD-10-CM | POA: Diagnosis not present

## 2023-01-12 DIAGNOSIS — E11649 Type 2 diabetes mellitus with hypoglycemia without coma: Secondary | ICD-10-CM | POA: Diagnosis not present

## 2023-01-12 DIAGNOSIS — J9811 Atelectasis: Secondary | ICD-10-CM | POA: Diagnosis not present

## 2023-01-12 DIAGNOSIS — E1122 Type 2 diabetes mellitus with diabetic chronic kidney disease: Secondary | ICD-10-CM | POA: Diagnosis present

## 2023-01-12 DIAGNOSIS — E860 Dehydration: Secondary | ICD-10-CM | POA: Diagnosis present

## 2023-01-12 DIAGNOSIS — I69354 Hemiplegia and hemiparesis following cerebral infarction affecting left non-dominant side: Secondary | ICD-10-CM | POA: Diagnosis not present

## 2023-01-12 DIAGNOSIS — I2510 Atherosclerotic heart disease of native coronary artery without angina pectoris: Secondary | ICD-10-CM | POA: Diagnosis not present

## 2023-01-12 DIAGNOSIS — Z7984 Long term (current) use of oral hypoglycemic drugs: Secondary | ICD-10-CM

## 2023-01-12 DIAGNOSIS — G4733 Obstructive sleep apnea (adult) (pediatric): Secondary | ICD-10-CM | POA: Diagnosis not present

## 2023-01-12 DIAGNOSIS — E1165 Type 2 diabetes mellitus with hyperglycemia: Secondary | ICD-10-CM | POA: Diagnosis present

## 2023-01-12 DIAGNOSIS — N189 Chronic kidney disease, unspecified: Secondary | ICD-10-CM | POA: Diagnosis not present

## 2023-01-12 DIAGNOSIS — I10 Essential (primary) hypertension: Secondary | ICD-10-CM

## 2023-01-12 DIAGNOSIS — I130 Hypertensive heart and chronic kidney disease with heart failure and stage 1 through stage 4 chronic kidney disease, or unspecified chronic kidney disease: Secondary | ICD-10-CM | POA: Diagnosis present

## 2023-01-12 DIAGNOSIS — M1990 Unspecified osteoarthritis, unspecified site: Secondary | ICD-10-CM | POA: Diagnosis present

## 2023-01-12 DIAGNOSIS — Z885 Allergy status to narcotic agent status: Secondary | ICD-10-CM

## 2023-01-12 DIAGNOSIS — I700 Atherosclerosis of aorta: Secondary | ICD-10-CM | POA: Diagnosis not present

## 2023-01-12 DIAGNOSIS — N281 Cyst of kidney, acquired: Secondary | ICD-10-CM | POA: Diagnosis not present

## 2023-01-12 DIAGNOSIS — I5032 Chronic diastolic (congestive) heart failure: Secondary | ICD-10-CM | POA: Diagnosis present

## 2023-01-12 DIAGNOSIS — N179 Acute kidney failure, unspecified: Secondary | ICD-10-CM | POA: Diagnosis not present

## 2023-01-12 DIAGNOSIS — J449 Chronic obstructive pulmonary disease, unspecified: Secondary | ICD-10-CM | POA: Diagnosis present

## 2023-01-12 DIAGNOSIS — I5031 Acute diastolic (congestive) heart failure: Secondary | ICD-10-CM | POA: Diagnosis not present

## 2023-01-12 DIAGNOSIS — Z955 Presence of coronary angioplasty implant and graft: Secondary | ICD-10-CM

## 2023-01-12 DIAGNOSIS — S42202A Unspecified fracture of upper end of left humerus, initial encounter for closed fracture: Secondary | ICD-10-CM | POA: Diagnosis not present

## 2023-01-12 DIAGNOSIS — E119 Type 2 diabetes mellitus without complications: Secondary | ICD-10-CM | POA: Diagnosis not present

## 2023-01-12 DIAGNOSIS — F419 Anxiety disorder, unspecified: Secondary | ICD-10-CM | POA: Diagnosis present

## 2023-01-12 DIAGNOSIS — S42292A Other displaced fracture of upper end of left humerus, initial encounter for closed fracture: Secondary | ICD-10-CM | POA: Diagnosis not present

## 2023-01-12 DIAGNOSIS — Z9989 Dependence on other enabling machines and devices: Secondary | ICD-10-CM | POA: Diagnosis not present

## 2023-01-12 DIAGNOSIS — Z7902 Long term (current) use of antithrombotics/antiplatelets: Secondary | ICD-10-CM

## 2023-01-12 DIAGNOSIS — Z7951 Long term (current) use of inhaled steroids: Secondary | ICD-10-CM

## 2023-01-12 DIAGNOSIS — Z96612 Presence of left artificial shoulder joint: Secondary | ICD-10-CM | POA: Diagnosis not present

## 2023-01-12 DIAGNOSIS — Z905 Acquired absence of kidney: Secondary | ICD-10-CM

## 2023-01-12 DIAGNOSIS — Z89412 Acquired absence of left great toe: Secondary | ICD-10-CM

## 2023-01-12 DIAGNOSIS — S42252A Displaced fracture of greater tuberosity of left humerus, initial encounter for closed fracture: Secondary | ICD-10-CM | POA: Diagnosis not present

## 2023-01-12 LAB — SURGICAL PCR SCREEN
MRSA, PCR: NEGATIVE
Staphylococcus aureus: NEGATIVE

## 2023-01-12 LAB — CBC
HCT: 46.2 % (ref 39.0–52.0)
Hemoglobin: 14.7 g/dL (ref 13.0–17.0)
MCH: 26.8 pg (ref 26.0–34.0)
MCHC: 31.8 g/dL (ref 30.0–36.0)
MCV: 84.3 fL (ref 80.0–100.0)
Platelets: 208 10*3/uL (ref 150–400)
RBC: 5.48 MIL/uL (ref 4.22–5.81)
RDW: 13.5 % (ref 11.5–15.5)
WBC: 8.5 10*3/uL (ref 4.0–10.5)
nRBC: 0 % (ref 0.0–0.2)

## 2023-01-12 LAB — GLUCOSE, CAPILLARY
Glucose-Capillary: 159 mg/dL — ABNORMAL HIGH (ref 70–99)
Glucose-Capillary: 191 mg/dL — ABNORMAL HIGH (ref 70–99)
Glucose-Capillary: 59 mg/dL — ABNORMAL LOW (ref 70–99)
Glucose-Capillary: 60 mg/dL — ABNORMAL LOW (ref 70–99)
Glucose-Capillary: 75 mg/dL (ref 70–99)
Glucose-Capillary: 78 mg/dL (ref 70–99)

## 2023-01-12 LAB — COMPREHENSIVE METABOLIC PANEL
ALT: 13 U/L (ref 0–44)
AST: 11 U/L — ABNORMAL LOW (ref 15–41)
Albumin: 3.1 g/dL — ABNORMAL LOW (ref 3.5–5.0)
Alkaline Phosphatase: 100 U/L (ref 38–126)
Anion gap: 10 (ref 5–15)
BUN: 12 mg/dL (ref 8–23)
CO2: 23 mmol/L (ref 22–32)
Calcium: 8.7 mg/dL — ABNORMAL LOW (ref 8.9–10.3)
Chloride: 103 mmol/L (ref 98–111)
Creatinine, Ser: 1.23 mg/dL (ref 0.61–1.24)
GFR, Estimated: >60 mL/min (ref >60)
Glucose, Bld: 137 mg/dL — ABNORMAL HIGH (ref 70–99)
Potassium: 3.3 mmol/L — ABNORMAL LOW (ref 3.5–5.1)
Sodium: 136 mmol/L (ref 135–145)
Total Bilirubin: 0.5 mg/dL (ref 0.3–1.2)
Total Protein: 6.8 g/dL (ref 6.5–8.1)

## 2023-01-12 LAB — PROTIME-INR
INR: 1.1 (ref 0.8–1.2)
Prothrombin Time: 14.1 seconds (ref 11.4–15.2)

## 2023-01-12 MED ORDER — INSULIN ASPART 100 UNIT/ML IJ SOLN
0.0000 [IU] | Freq: Three times a day (TID) | INTRAMUSCULAR | Status: DC
  Administered 2023-01-12: 3 [IU] via SUBCUTANEOUS

## 2023-01-12 MED ORDER — RANOLAZINE ER 500 MG PO TB12
500.0000 mg | ORAL_TABLET | Freq: Two times a day (BID) | ORAL | Status: DC
  Administered 2023-01-12 – 2023-01-19 (×14): 500 mg via ORAL
  Filled 2023-01-12 (×15): qty 1

## 2023-01-12 MED ORDER — ALBUTEROL SULFATE (2.5 MG/3ML) 0.083% IN NEBU: 2.5000 mg | INHALATION_SOLUTION | RESPIRATORY_TRACT | Status: DC | PRN

## 2023-01-12 MED ORDER — DILTIAZEM HCL ER COATED BEADS 180 MG PO CP24
180.0000 mg | ORAL_CAPSULE | Freq: Every day | ORAL | Status: DC
  Administered 2023-01-13 – 2023-01-15 (×3): 180 mg via ORAL
  Filled 2023-01-12 (×3): qty 1

## 2023-01-12 MED ORDER — METOPROLOL TARTRATE 5 MG/5ML IV SOLN
5.0000 mg | Freq: Four times a day (QID) | INTRAVENOUS | Status: DC | PRN
  Administered 2023-01-12: 5 mg via INTRAVENOUS
  Filled 2023-01-12: qty 5

## 2023-01-12 MED ORDER — ACETAMINOPHEN 650 MG RE SUPP: 650.0000 mg | Freq: Four times a day (QID) | RECTAL | Status: DC | PRN

## 2023-01-12 MED ORDER — PREGABALIN 25 MG PO CAPS
25.0000 mg | ORAL_CAPSULE | Freq: Two times a day (BID) | ORAL | Status: DC
  Administered 2023-01-12 – 2023-01-19 (×14): 25 mg via ORAL
  Filled 2023-01-12 (×14): qty 1

## 2023-01-12 MED ORDER — DOCUSATE SODIUM 100 MG PO CAPS
100.0000 mg | ORAL_CAPSULE | Freq: Two times a day (BID) | ORAL | Status: DC
  Administered 2023-01-12: 100 mg via ORAL
  Filled 2023-01-12 (×2): qty 1

## 2023-01-12 MED ORDER — PANTOPRAZOLE SODIUM 40 MG PO TBEC
40.0000 mg | DELAYED_RELEASE_TABLET | Freq: Every day | ORAL | Status: DC
  Administered 2023-01-12 – 2023-01-19 (×8): 40 mg via ORAL
  Filled 2023-01-12 (×8): qty 1

## 2023-01-12 MED ORDER — MORPHINE SULFATE (PF) 2 MG/ML IV SOLN
2.0000 mg | INTRAVENOUS | Status: DC | PRN
  Administered 2023-01-13: 2 mg via INTRAVENOUS
  Filled 2023-01-12: qty 1

## 2023-01-12 MED ORDER — OXYCODONE-ACETAMINOPHEN 5-325 MG PO TABS
1.0000 | ORAL_TABLET | Freq: Four times a day (QID) | ORAL | Status: DC | PRN
  Administered 2023-01-12 (×2): 1 via ORAL
  Filled 2023-01-12 (×2): qty 1

## 2023-01-12 MED ORDER — ALPRAZOLAM 0.5 MG PO TABS: 1.0000 mg | ORAL_TABLET | Freq: Three times a day (TID) | ORAL | Status: DC | PRN

## 2023-01-12 MED ORDER — INSULIN ASPART 100 UNIT/ML IJ SOLN: 0.0000 [IU] | Freq: Every day | INTRAMUSCULAR | Status: DC

## 2023-01-12 MED ORDER — OXYCODONE HCL 5 MG PO TABS
5.0000 mg | ORAL_TABLET | Freq: Four times a day (QID) | ORAL | Status: DC | PRN
  Administered 2023-01-12: 5 mg via ORAL
  Filled 2023-01-12: qty 1

## 2023-01-12 MED ORDER — ACETAMINOPHEN 325 MG PO TABS: 650.0000 mg | ORAL_TABLET | Freq: Four times a day (QID) | ORAL | Status: DC | PRN

## 2023-01-12 MED ORDER — INSULIN REGULAR HUMAN (CONC) 500 UNIT/ML ~~LOC~~ SOLN
90.0000 [IU] | Freq: Three times a day (TID) | SUBCUTANEOUS | Status: DC
  Filled 2023-01-12: qty 20

## 2023-01-12 MED ORDER — INSULIN REGULAR HUMAN (CONC) 500 UNIT/ML ~~LOC~~ SOPN
90.0000 [IU] | PEN_INJECTOR | Freq: Three times a day (TID) | SUBCUTANEOUS | Status: DC
  Administered 2023-01-12: 90 [IU] via SUBCUTANEOUS
  Filled 2023-01-12: qty 3

## 2023-01-12 MED ORDER — POLYETHYLENE GLYCOL 3350 17 G PO PACK: 17.0000 g | PACK | Freq: Every day | ORAL | Status: DC | PRN

## 2023-01-12 MED ORDER — ENOXAPARIN SODIUM 40 MG/0.4ML IJ SOSY
40.0000 mg | PREFILLED_SYRINGE | Freq: Once | INTRAMUSCULAR | Status: AC
  Administered 2023-01-12: 40 mg via SUBCUTANEOUS
  Filled 2023-01-12: qty 0.4

## 2023-01-12 MED ORDER — ONDANSETRON HCL 4 MG/2ML IJ SOLN: 4.0000 mg | Freq: Four times a day (QID) | INTRAMUSCULAR | Status: DC | PRN

## 2023-01-12 MED ORDER — TRAZODONE HCL 50 MG PO TABS: 25.0000 mg | ORAL_TABLET | Freq: Every evening | ORAL | Status: DC | PRN

## 2023-01-12 MED ORDER — ATORVASTATIN CALCIUM 20 MG PO TABS
20.0000 mg | ORAL_TABLET | Freq: Every day | ORAL | Status: DC
  Administered 2023-01-12 – 2023-01-18 (×7): 20 mg via ORAL
  Filled 2023-01-12 (×7): qty 1

## 2023-01-12 MED ORDER — SODIUM CHLORIDE 0.9 % IV SOLN: INTRAVENOUS | Status: AC

## 2023-01-12 MED ORDER — TRAMADOL HCL 50 MG PO TABS: 50.0000 mg | ORAL_TABLET | Freq: Four times a day (QID) | ORAL | Status: DC | PRN

## 2023-01-12 MED ORDER — ONDANSETRON HCL 4 MG PO TABS: 4.0000 mg | ORAL_TABLET | Freq: Four times a day (QID) | ORAL | Status: DC | PRN

## 2023-01-12 NOTE — H&P (View-Only) (Signed)
PREOPERATIVE H&P  Chief Complaint: left proximal humerus fracture , dislocation  HPI: [name] is a 67 y.o. male who presents for preoperative history and physical prior to scheduled surgery, Procedure(s): REVERSE SHOULDER ARTHROPLASTY.   Patient has a past medical history significant for multiple co-morbidities, see below.   The patient had a fall on 12/27/2022.  He was seen in the ER.  He had an irreducible shoulder dislocation.  He was sent home at that point for outpatient followup.  He had followup ten days later.  Dr. Jena Gauss sent him directly from his office to my office for urgent evaluation.  The patient has extraordinarily long complex medial history.  He had a stroke in 1997, heart attack in 2007, he has one functional kidney.  He has diabetes with hemoglobin A1C of 10.  His left shoulder has been quite painful.  He has not been in a sling  Symptoms are rated as moderate to severe, and have been worsening.  This is significantly impairing activities of daily living.    Please see clinic note for further details on this patient's care.    He has elected for surgical management.   Past Medical History:  Diagnosis Date   Anxiety    Arthritis    Cholecystitis, acute 12/20/2013   Lap chole 6/5   Cocaine abuse ([name])    Community acquired pneumonia 05/31/2018   COPD (chronic obstructive pulmonary disease) ([name])    Coronary atherosclerosis of native coronary artery    a. 03/09/2013 Cath/PCI: LM nl, LAD: 50p, 50m (2.5x16 promus DES), LCX nl, OM1 min irregs, LPL/LPDA diff dzs, RCA nondom, mod diff dzs, EF 55%.   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 ([name]) 04/24/2021   History of nephrolithiasis    History of pneumonia    History of stroke    Right MCA distribution, residual left-sided weakness   Hyperlipidemia    Metabolic  encephalopathy    Noncompliance    Osteomyelitis of second toe of left foot ([name]) 04/24/2021   Renal cell carcinoma ([name])    Status post radical right nephrectomy August 2015   Respiratory failure with hypoxia ([name])    Sepsis ([name]) 05/19/2019   Sleep apnea    On CPAP, 4L O2 no cpap at home yet   Type 2 diabetes mellitus ([name]) 2011   Past Surgical History:  Procedure Laterality Date   ABDOMINAL AORTOGRAM W/LOWER EXTREMITY Left 07/17/2021   Procedure: ABDOMINAL AORTOGRAM W/LOWER EXTREMITY;  Surgeon: Nada Libman, MD;  Location: [facility];  Service: Cardiovascular;  Laterality: Left;   AMPUTATION Left 12/27/2020   Procedure: PARTIAL FIRST RAY AMPUTATION OF FOOT;  Surgeon: Candelaria Stagers, DPM;  Location: [facility] OR;  Service: Podiatry;  Laterality: Left;   APPENDECTOMY  1970's   CHOLECYSTECTOMY N/A 12/22/2013   Procedure: LAPAROSCOPIC CHOLECYSTECTOMY ;  Surgeon: Shelly Rubenstein, MD;  Location: [facility] OR;  Service: General;  Laterality: N/A;   ENDARTERECTOMY Right 10/08/2014   Procedure: Right ENDARTERECTOMY CAROTID;  Surgeon: Larina Earthly, MD;  Location: [facility] OR;  Service: Vascular;  Laterality: Right;   LAPAROSCOPIC LYSIS OF ADHESIONS  02/21/2014   Procedure: LAPAROSCOPIC LYSIS OF ADHESIONS EXTINSIVE;  Surgeon: Sebastian Ache, MD;  Location: [facility] ORS;  Service: Urology;;   LEFT HEART CATHETERIZATION WITH CORONARY ANGIOGRAM N/A 06/02/2012  Procedure: LEFT HEART CATHETERIZATION WITH CORONARY ANGIOGRAM;  Surgeon: Herby Abraham, MD;  Location: [facility] CATH LAB;  Service: Cardiovascular;  Laterality: N/A;   LEFT HEART CATHETERIZATION WITH CORONARY ANGIOGRAM N/A 03/09/2013   Procedure: LEFT HEART CATHETERIZATION WITH CORONARY ANGIOGRAM;  Surgeon: Tonny Bollman, MD;  Location: [facility] CATH LAB;  Service: Cardiovascular;  Laterality: N/A;   LEFT HEART CATHETERIZATION WITH CORONARY ANGIOGRAM N/A 12/20/2013   Procedure: LEFT HEART CATHETERIZATION WITH CORONARY ANGIOGRAM;  Surgeon: Kathleene Hazel, MD;   Location: [facility] CATH LAB;  Service: Cardiovascular;  Laterality: N/A;   LUNG BIOPSY Left 05/18/2014   Procedure: LUNG BIOPSY left upper lobe & left lower lobe;  Surgeon: Loreli Slot, MD;  Location: [facility] OR;  Service: Thoracic;  Laterality: Left;   PATCH ANGIOPLASTY Right 10/08/2014   Procedure: PATCH ANGIOPLASTY Right Carotid;  Surgeon: Larina Earthly, MD;  Location: [facility] OR;  Service: Vascular;  Laterality: Right;   PERIPHERAL VASCULAR INTERVENTION Left 07/17/2021   Procedure: PERIPHERAL VASCULAR INTERVENTION;  Surgeon: Nada Libman, MD;  Location: [facility];  Service: Cardiovascular;  Laterality: Left;  Superficial Femoral Artery   ROBOT ASSISTED LAPAROSCOPIC NEPHRECTOMY Right 02/21/2014   Procedure: ROBOTIC ASSISTED LAPAROSCOPIC RIGHT NEPHRECTOMY ;  Surgeon: Sebastian Ache, MD;  Location: [facility] ORS;  Service: Urology;  Laterality: Right;   VIDEO ASSISTED THORACOSCOPY Left 05/18/2014   Procedure: LEFT VIDEO ASSISTED THORACOSCOPY;  Surgeon: Loreli Slot, MD;  Location: [facility] OR;  Service: Thoracic;  Laterality: Left;   VIDEO BRONCHOSCOPY  05/18/2014   Procedure: VIDEO BRONCHOSCOPY;  Surgeon: Loreli Slot, MD;  Location: [facility] OR;  Service: Thoracic;;   Social History   Socioeconomic History   Marital status: Single    Spouse name: Not on file   Number of children: Not on file   Years of education: Not on file   Highest education level: Not on file  Occupational History   Occupation: N/A  Tobacco Use   Smoking status: Every Day    Packs/day: 1.00    Years: 41.00    Additional pack years: 0.00    Total pack years: 41.00    Types: Cigarettes    Start date: 05/03/1972    Passive exposure: Never   Smokeless tobacco: Never   Tobacco comments:    1/2 ppd 09/25/2022 hfb  Vaping Use   Vaping Use: Never used  Substance and Sexual Activity   Alcohol use: No    Alcohol/week: 0.0 standard drinks of alcohol   Drug use: No    Types: Cocaine    Comment: last used cocaine  11/2013. but is cocaine + this admit July 2015   Sexual activity: Never  Other Topics Concern   Not on file  Social History Narrative   Lives in [place] with his mother.  He does not work.   Social Determinants of Health   Financial Resource Strain: Low Risk  (05/19/2019)   Overall Financial Resource Strain (CARDIA)    Difficulty of Paying Living Expenses: Not hard at all  Food Insecurity: No Food Insecurity (01/12/2023)   Hunger Vital Sign    Worried About Running Out of Food in the Last Year: Never true    Ran Out of Food in the Last Year: Never true  Transportation Needs: No Transportation Needs (01/12/2023)   PRAPARE - Administrator, Civil Service (Medical): No    Lack of Transportation (Non-Medical): No  Physical Activity: Inactive (05/19/2019)   Exercise Vital Sign    Days  of Exercise per Week: 0 days    Minutes of Exercise per Session: 0 min  Stress: No Stress Concern Present (05/19/2019)   [employer] of Occupational Health - Occupational Stress Questionnaire    Feeling of Stress : Not at all  Social Connections: Unknown (05/19/2019)   Social Connection and Isolation Panel [NHANES]    Frequency of Communication with Friends and Family: More than three times a week    Frequency of Social Gatherings with Friends and Family: More than three times a week    Attends Religious Services: More than 4 times per year    Active Member of [employer] or Organizations: Yes    Attends Engineer, structural: More than 4 times per year    Marital Status: Not on file   Family History  Problem Relation Age of Onset   Cancer Mother        Thyroid - living in her 25's.   Hypertension Other    Diabetes Other    Stroke Other    Lung cancer Father    CAD Father    Cancer Maternal Grandmother        Breast   Cancer Maternal Grandfather        Throat and stomach   CAD Brother    Allergies  Allergen Reactions   Lisinopril Other (See Comments)    Other  reaction(s): worsening kidney function   Pollen Extract     Other reaction(s): sneezing   Oxycodone Itching and Nausea Only    Pt is able to tolerate if taken with benadryl   Prior to Admission medications   Medication Sig Start Date End Date Taking? Authorizing Provider  acetaminophen (TYLENOL) 500 MG tablet Take 1,000 mg by mouth every 6 (six) hours as needed for moderate pain. 06/03/18  Yes [provider]  albuterol (PROVENTIL) (2.5 MG/3ML) 0.083% nebulizer solution Take 3 mLs (2.5 mg total) by nebulization every 6 (six) hours as needed for wheezing or shortness of breath. 09/04/22  Yes Cobb, Ruby Cola, NP  ALPRAZolam Prudy Feeler) 1 MG tablet Take 1 mg by mouth 3 (three) times daily. 01/07/21  Yes [provider]  atorvastatin (LIPITOR) 20 MG tablet Take 20 mg by mouth at bedtime. 04/26/18  Yes [provider]  benzonatate (TESSALON) 200 MG capsule TAKE ONE CAPSULE BY MOUTH THREE TIMES DAILY AS NEEDED FOR COUGH Patient taking differently: Take 200 mg by mouth 3 (three) times daily as needed for cough. 09/29/22  Yes Kalman Shan, MD  budesonide (PULMICORT) 1 MG/2ML nebulizer solution Take 2 mLs (1 mg total) by nebulization in the morning and at bedtime. Patient taking differently: Take 1 mg by nebulization 2 (two) times daily as needed (Weezing/SOB). 10/06/22  Yes Cobb, Ruby Cola, NP  clopidogrel (PLAVIX) 75 MG tablet TAKE ONE TABLET BY MOUTH EVERY DAY 05/27/22  Yes Jonelle Sidle, MD  diltiazem (CARDIZEM CD) 180 MG 24 hr capsule TAKE ONE CAPSULE BY MOUTH EVERY DAY Patient taking differently: Take 180 mg by mouth daily. 10/21/22  Yes Jonelle Sidle, MD  diphenhydrAMINE (BENADRYL) 25 mg capsule Take 50 mg by mouth at bedtime.   Yes [provider]  esomeprazole (NEXIUM) 40 MG capsule Take 40 mg by mouth daily.   Yes [provider]  FLUoxetine (PROZAC) 40 MG capsule Take 40 mg by mouth daily.   Yes [provider]  formoterol  (PERFOROMIST) 20 MCG/2ML nebulizer solution Take 2 mLs (20 mcg total) by nebulization 2 (two) times daily. Patient  not taking: Reported on 01/12/2023 09/16/22  Yes Cobb, Ruby Cola, NP  gabapentin (NEURONTIN) 300 MG capsule Take 900 mg by mouth 3 (three) times daily.   Yes [provider]  insulin regular human CONCENTRATED (HUMULIN R) 500 UNIT/ML injection Inject 90 Units into the skin 3 (three) times daily.   Yes [provider]  isosorbide mononitrate (IMDUR) 30 MG 24 hr tablet Take 30 mg by mouth every evening.   Yes [provider]  isosorbide mononitrate (IMDUR) 60 MG 24 hr tablet Take 1 tablet (60 mg total) by mouth in the morning. & 30 mg in the evening Patient taking differently: Take 60 mg by mouth in the morning. 03/27/22  Yes Jonelle Sidle, MD  linagliptin (TRADJENTA) 5 MG TABS tablet Take 5 mg by mouth daily.   Yes [provider]  midodrine (PROAMATINE) 2.5 MG tablet Take 1 tablet (2.5 mg total) by mouth 2 (two) times daily. 08/11/22  Yes Jonelle Sidle, MD  oxyCODONE-acetaminophen (PERCOCET) 10-325 MG tablet Take 1 tablet by mouth every 6 (six) hours as needed for pain. 09/25/21  Yes [provider]  potassium chloride SA (KLOR-CON M) 20 MEQ tablet Take 1 tablet (20 mEq total) by mouth 2 (two) times daily. 12/28/22  Yes Johnson, Clanford L, MD  pregabalin (LYRICA) 25 MG capsule Take 25 mg by mouth 2 (two) times daily. 02/14/19  Yes [provider]  ranolazine (RANEXA) 500 MG 12 hr tablet Take 1 tablet (500 mg total) by mouth 2 (two) times daily. 08/27/22  Yes Sharlene Dory, NP  revefenacin (YUPELRI) 175 MCG/3ML nebulizer solution Take 3 mLs (175 mcg total) by nebulization daily. Patient taking differently: Take 175 mcg by nebulization daily as needed (Wheezing/SOB). 09/16/22  Yes Cobb, Ruby Cola, NP  tamsulosin (FLOMAX) 0.4 MG CAPS capsule Take 0.4 mg by mouth daily.    Yes [provider]  torsemide (DEMADEX) 20 MG  tablet Take 3 tablets (60 mg total) by mouth 2 (two) times daily. Patient taking differently: Take 60 mg by mouth 3 (three) times daily. 12/28/22  Yes Johnson, Clanford L, MD  traMADol (ULTRAM) 50 MG tablet Take 50 mg by mouth 4 (four) times daily as needed for moderate pain. 01/05/23  Yes [provider]  zolpidem (AMBIEN) 5 MG tablet Take 5 mg by mouth at bedtime. 03/03/19  Yes [provider]  doxycycline (VIBRA-TABS) 100 MG tablet Take 1 tablet (100 mg total) by mouth 2 (two) times daily. Patient not taking: Reported on 01/06/2023 12/09/22   Candelaria Stagers, DPM    ROS: All other systems have been reviewed and were otherwise negative with the exception of those mentioned in the HPI and as above.  Physical Exam: General: Alert, no acute distress Cardiovascular: No pedal edema Respiratory: No cyanosis, no use of accessory musculature GI: No organomegaly, abdomen is soft and non-tender Skin: No lesions in the area of chief complaint Neurologic: Sensation intact distally Psychiatric: Patient is competent for consent with normal mood and affect Lymphatic: No axillary or cervical lymphadenopathy  MUSCULOSKELETAL:  Range of motion of the shoulder is not able to be tested.  His deltoid function appears to be abnormal.  His axillary nerve sensation appears to be altered.  He is not able to really demonstrate firing of his deltoid secondary to pain.  Distal motor and sensory function appears to be completely intact.  Skin is quite thin.  Otherwise no obvious wounds around the shoulder.    Imaging: X-rays demonstrate on  multiple views a complex chronic fracture/dislocation of the shoulder with anterior and inferior dislocation.    BMI: There is no height or weight on file to calculate BMI.  Lab Results  Component Value Date   ALBUMIN 3.1 (L) 12/23/2022   Diabetes:   Patient has a diagnosis of diabetes,  Lab Results  Component Value Date   HGBA1C 9.3 (H) 12/22/2022    Smoking Status: Social History   Tobacco Use  Smoking Status Every Day   Packs/day: 1.00   Years: 41.00   Additional pack years: 0.00   Total pack years: 41.00   Types: Cigarettes   Start date: 05/03/1972   Passive exposure: Never  Smokeless Tobacco Never  Tobacco Comments   1/2 ppd 09/25/2022 hfb   Ready to quit: Not Answered Counseling given: Not Answered Tobacco comments: 1/2 ppd 09/25/2022 hfb  Urgent surgery due to dislocation         Assessment: left proximal humerus fracture , dislocation  Plan: Plan for Procedure(s): REVERSE SHOULDER ARTHROPLASTY  Unfortunately the patient does not have any other option than surgery.  His brachial plexus has clearly been injured with his dislocation and fracture.  I think it is appropriate to consider a hemiarthroplasty versus reverse total shoulder arthroplasty, depending on his overall cuff stability.  We will probably lean towards hemiarthroplasty if possible.  Risks, benefits and alternatives of surgery were discussed.  He has extraordinarily high risk of continued instability, infection and periprosthetic fracture amongst others.    The risks benefits and alternatives were discussed with the patient including but not limited to the risks of nonoperative treatment, versus surgical intervention including infection, bleeding, nerve injury,  blood clots, cardiopulmonary complications, morbidity, mortality, among others, and they were willing to proceed.   We additionally specifically discussed risks of axillary nerve injury, infection, periprosthetic fracture, continued pain and longevity of implants prior to beginning procedure.    Patient will be admitted for inpatient treatment for surgery, pain control, OT, prophylactic antibiotics, VTE prophylaxis, and discharge planning. The patient is planning to be discharged home with outpatient PT.   Patient has been admitted to hospitalist service prior to surgery. We appreciate their  assistance on the patient.   The patient acknowledged the explanation, agreed to proceed with the plan and consent was signed.   Operative Plan: Left hemiarthroplasty versus reverse total shoulder arthroplasty Discharge Medications: standard DVT Prophylaxis: resume plavix Physical Therapy: delayed outpatient PT Special Discharge needs: Sling. [address]   Vernetta Honey, [place]  01/12/2023 4:54 PM

## 2023-01-12 NOTE — Telephone Encounter (Signed)
Katie please advise on Clearance. Surgery is tomorrow

## 2023-01-12 NOTE — Progress Notes (Signed)
Hypoglycemic Event  CBG: 60  Treatment: 8 oz juice/soda  Symptoms: Hungry  Follow-up CBG: Time:2152 CBG Result:78  Possible Reasons for Event: Inadequate meal intake  Comments/MD notified:James Daniels,NP    Montel Clock

## 2023-01-12 NOTE — Consult Note (Cosign Needed Addendum)
  PREOPERATIVE H&P  Chief Complaint: left proximal humerus fracture , dislocation  HPI: [name] is a 66 y.o. male who presents for preoperative history and physical prior to scheduled surgery, Procedure(s): REVERSE SHOULDER ARTHROPLASTY.   Patient has a past medical history significant for multiple co-morbidities, see below.   The patient had a fall on 12/27/2022.  He was seen in the ER.  He had an irreducible shoulder dislocation.  He was sent home at that point for outpatient followup.  He had followup ten days later.  Dr. Haddix sent him directly from his office to my office for urgent evaluation.  The patient has extraordinarily long complex medial history.  He had a stroke in 1997, heart attack in 2007, he has one functional kidney.  He has diabetes with hemoglobin A1C of 10.  His left shoulder has been quite painful.  He has not been in a sling  Symptoms are rated as moderate to severe, and have been worsening.  This is significantly impairing activities of daily living.    Please see clinic note for further details on this patient's care.    He has elected for surgical management.   Past Medical History:  Diagnosis Date   Anxiety    Arthritis    Cholecystitis, acute 12/20/2013   Lap chole 6/5   Cocaine abuse ([name])    Community acquired pneumonia 05/31/2018   COPD (chronic obstructive pulmonary disease) ([name])    Coronary atherosclerosis of native coronary artery    a. 03/09/2013 Cath/PCI: LM nl, LAD: 50p, 90m (2.5x16 promus DES), LCX nl, OM1 min irregs, LPL/LPDA diff dzs, RCA nondom, mod diff dzs, EF 55%.   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 ([name]) 04/24/2021   History of nephrolithiasis    History of pneumonia    History of stroke    Right MCA distribution, residual left-sided weakness   Hyperlipidemia    Metabolic  encephalopathy    Noncompliance    Osteomyelitis of second toe of left foot ([name]) 04/24/2021   Renal cell carcinoma ([name])    Status post radical right nephrectomy August 2015   Respiratory failure with hypoxia ([name])    Sepsis ([name]) 05/19/2019   Sleep apnea    On CPAP, 4L O2 no cpap at home yet   Type 2 diabetes mellitus ([name]) 2011   Past Surgical History:  Procedure Laterality Date   ABDOMINAL AORTOGRAM W/LOWER EXTREMITY Left 07/17/2021   Procedure: ABDOMINAL AORTOGRAM W/LOWER EXTREMITY;  Surgeon: Brabham, Vance W, MD;  Location: [facility];  Service: Cardiovascular;  Laterality: Left;   AMPUTATION Left 12/27/2020   Procedure: PARTIAL FIRST RAY AMPUTATION OF FOOT;  Surgeon: Patel, Kevin P, DPM;  Location: [facility];  Service: Podiatry;  Laterality: Left;   APPENDECTOMY  1970's   CHOLECYSTECTOMY N/A 12/22/2013   Procedure: LAPAROSCOPIC CHOLECYSTECTOMY ;  Surgeon: Douglas A Blackman, MD;  Location: [facility];  Service: General;  Laterality: N/A;   ENDARTERECTOMY Right 10/08/2014   Procedure: Right ENDARTERECTOMY CAROTID;  Surgeon: Todd F Early, MD;  Location: [facility];  Service: Vascular;  Laterality: Right;   LAPAROSCOPIC LYSIS OF ADHESIONS  02/21/2014   Procedure: LAPAROSCOPIC LYSIS OF ADHESIONS EXTINSIVE;  Surgeon: Theodore Manny, MD;  Location: [facility];  Service: Urology;;   LEFT HEART CATHETERIZATION WITH CORONARY ANGIOGRAM N/A 06/02/2012     Procedure: LEFT HEART CATHETERIZATION WITH CORONARY ANGIOGRAM;  Surgeon: Thomas D Stuckey, MD;  Location: [facility];  Service: Cardiovascular;  Laterality: N/A;   LEFT HEART CATHETERIZATION WITH CORONARY ANGIOGRAM N/A 03/09/2013   Procedure: LEFT HEART CATHETERIZATION WITH CORONARY ANGIOGRAM;  Surgeon: Michael Cooper, MD;  Location: [facility];  Service: Cardiovascular;  Laterality: N/A;   LEFT HEART CATHETERIZATION WITH CORONARY ANGIOGRAM N/A 12/20/2013   Procedure: LEFT HEART CATHETERIZATION WITH CORONARY ANGIOGRAM;  Surgeon: Christopher D McAlhany, MD;   Location: [facility];  Service: Cardiovascular;  Laterality: N/A;   LUNG BIOPSY Left 05/18/2014   Procedure: LUNG BIOPSY left upper lobe & left lower lobe;  Surgeon: Steven C Hendrickson, MD;  Location: [facility];  Service: Thoracic;  Laterality: Left;   PATCH ANGIOPLASTY Right 10/08/2014   Procedure: PATCH ANGIOPLASTY Right Carotid;  Surgeon: Todd F Early, MD;  Location: [facility];  Service: Vascular;  Laterality: Right;   PERIPHERAL VASCULAR INTERVENTION Left 07/17/2021   Procedure: PERIPHERAL VASCULAR INTERVENTION;  Surgeon: Brabham, Vance W, MD;  [facility]: [facility];  Service: Cardiovascular;  Laterality: Left;  Superficial Femoral Artery   ROBOT ASSISTED LAPAROSCOPIC NEPHRECTOMY Right 02/21/2014   Procedure: ROBOTIC ASSISTED LAPAROSCOPIC RIGHT NEPHRECTOMY ;  Surgeon: Theodore Manny, MD;  Location: [facility];  Service: Urology;  Laterality: Right;   VIDEO ASSISTED THORACOSCOPY Left 05/18/2014   Procedure: LEFT VIDEO ASSISTED THORACOSCOPY;  Surgeon: Steven C Hendrickson, MD;  Location: [facility];  Service: Thoracic;  Laterality: Left;   VIDEO BRONCHOSCOPY  05/18/2014   Procedure: VIDEO BRONCHOSCOPY;  Surgeon: Steven C Hendrickson, MD;  Location: [facility];  Service: Thoracic;;   Social History   Socioeconomic History   Marital status: Single    Spouse name: Not on file   Number of children: Not on file   Years of education: Not on file   Highest education level: Not on file  Occupational History   Occupation: N/A  Tobacco Use   Smoking status: Every Day    Packs/day: 1.00    Years: 41.00    Additional pack years: 0.00    Total pack years: 41.00    Types: Cigarettes    Start date: 05/03/1972    Passive exposure: Never   Smokeless tobacco: Never   Tobacco comments:    1/2 ppd 09/25/2022 hfb  Vaping Use   Vaping Use: Never used  Substance and Sexual Activity   Alcohol use: No    Alcohol/week: 0.0 standard drinks of alcohol   Drug use: No    Types: Cocaine    Comment: last used cocaine  11/2013. but is cocaine + this admit July 2015   Sexual activity: Never  Other Topics Concern   Not on file  Social History Narrative   Lives in [place] with his mother.  He does not work.   Social Determinants of Health   Financial Resource Strain: Low Risk  (05/19/2019)   Overall Financial Resource Strain (CARDIA)    Difficulty of Paying Living Expenses: Not hard at all  Food Insecurity: No Food Insecurity (01/12/2023)   Hunger Vital Sign    Worried About Running Out of Food in the Last Year: Never true    Ran Out of Food in the Last Year: Never true  Transportation Needs: No Transportation Needs (01/12/2023)   PRAPARE - Transportation    Lack of Transportation (Medical): No    Lack of Transportation (Non-Medical): No  Physical Activity: Inactive (05/19/2019)   Exercise Vital Sign    Days   of Exercise per Week: 0 days    Minutes of Exercise per Session: 0 min  Stress: No Stress Concern Present (05/19/2019)   [employer] - Occupational Stress Questionnaire    Feeling of Stress : Not at all  Social Connections: Unknown (05/19/2019)   Social Connection and Isolation Panel [NHANES]    Frequency of Communication with Friends and Family: More than three times a week    Frequency of Social Gatherings with Friends and Family: More than three times a week    Attends Religious Services: More than 4 times per year    Active Member of Clubs or Organizations: Yes    Attends Club or Organization Meetings: More than 4 times per year    Marital Status: Not on file   Family History  Problem Relation Age of Onset   Cancer Mother        Thyroid - living in her 80's.   Hypertension Other    Diabetes Other    Stroke Other    Lung cancer Father    CAD Father    Cancer Maternal Grandmother        Breast   Cancer Maternal Grandfather        Throat and stomach   CAD Brother    Allergies  Allergen Reactions   Lisinopril Other (See Comments)    Other  reaction(s): worsening kidney function   Pollen Extract     Other reaction(s): sneezing   Oxycodone Itching and Nausea Only    Pt is able to tolerate if taken with benadryl   Prior to Admission medications   Medication Sig Start Date End Date Taking? Authorizing Provider  acetaminophen (TYLENOL) 500 MG tablet Take 1,000 mg by mouth every 6 (six) hours as needed for moderate pain. 06/03/18  Yes [provider]  albuterol (PROVENTIL) (2.5 MG/3ML) 0.083% nebulizer solution Take 3 mLs (2.5 mg total) by nebulization every 6 (six) hours as needed for wheezing or shortness of breath. 09/04/22  Yes Cobb, Katherine V, NP  ALPRAZolam (XANAX) 1 MG tablet Take 1 mg by mouth 3 (three) times daily. 01/07/21  Yes [provider]  atorvastatin (LIPITOR) 20 MG tablet Take 20 mg by mouth at bedtime. 04/26/18  Yes [provider]  benzonatate (TESSALON) 200 MG capsule TAKE ONE CAPSULE BY MOUTH THREE TIMES DAILY AS NEEDED FOR COUGH Patient taking differently: Take 200 mg by mouth 3 (three) times daily as needed for cough. 09/29/22  Yes Ramaswamy, Murali, MD  budesonide (PULMICORT) 1 MG/2ML nebulizer solution Take 2 mLs (1 mg total) by nebulization in the morning and at bedtime. Patient taking differently: Take 1 mg by nebulization 2 (two) times daily as needed (Weezing/SOB). 10/06/22  Yes Cobb, Katherine V, NP  clopidogrel (PLAVIX) 75 MG tablet TAKE ONE TABLET BY MOUTH EVERY DAY 05/27/22  Yes McDowell, Samuel G, MD  diltiazem (CARDIZEM CD) 180 MG 24 hr capsule TAKE ONE CAPSULE BY MOUTH EVERY DAY Patient taking differently: Take 180 mg by mouth daily. 10/21/22  Yes McDowell, Samuel G, MD  diphenhydrAMINE (BENADRYL) 25 mg capsule Take 50 mg by mouth at bedtime.   Yes [provider]  esomeprazole (NEXIUM) 40 MG capsule Take 40 mg by mouth daily.   Yes [provider]  FLUoxetine (PROZAC) 40 MG capsule Take 40 mg by mouth daily.   Yes [provider]  formoterol  (PERFOROMIST) 20 MCG/2ML nebulizer solution Take 2 mLs (20 mcg total) by nebulization 2 (two) times daily. Patient   not taking: Reported on 01/12/2023 09/16/22  Yes Cobb, Katherine V, NP  gabapentin (NEURONTIN) 300 MG capsule Take 900 mg by mouth 3 (three) times daily.   Yes [provider]  insulin regular human CONCENTRATED (HUMULIN R) 500 UNIT/ML injection Inject 90 Units into the skin 3 (three) times daily.   Yes [provider]  isosorbide mononitrate (IMDUR) 30 MG 24 hr tablet Take 30 mg by mouth every evening.   Yes [provider]  isosorbide mononitrate (IMDUR) 60 MG 24 hr tablet Take 1 tablet (60 mg total) by mouth in the morning. & 30 mg in the evening Patient taking differently: Take 60 mg by mouth in the morning. 03/27/22  Yes McDowell, Samuel G, MD  linagliptin (TRADJENTA) 5 MG TABS tablet Take 5 mg by mouth daily.   Yes [provider]  midodrine (PROAMATINE) 2.5 MG tablet Take 1 tablet (2.5 mg total) by mouth 2 (two) times daily. 08/11/22  Yes McDowell, Samuel G, MD  oxyCODONE-acetaminophen (PERCOCET) 10-325 MG tablet Take 1 tablet by mouth every 6 (six) hours as needed for pain. 09/25/21  Yes [provider]  potassium chloride SA (KLOR-CON M) 20 MEQ tablet Take 1 tablet (20 mEq total) by mouth 2 (two) times daily. 12/28/22  Yes Johnson, Clanford L, MD  pregabalin (LYRICA) 25 MG capsule Take 25 mg by mouth 2 (two) times daily. 02/14/19  Yes [provider]  ranolazine (RANEXA) 500 MG 12 hr tablet Take 1 tablet (500 mg total) by mouth 2 (two) times daily. 08/27/22  Yes Peck, Elizabeth, NP  revefenacin (YUPELRI) 175 MCG/3ML nebulizer solution Take 3 mLs (175 mcg total) by nebulization daily. Patient taking differently: Take 175 mcg by nebulization daily as needed (Wheezing/SOB). 09/16/22  Yes Cobb, Katherine V, NP  tamsulosin (FLOMAX) 0.4 MG CAPS capsule Take 0.4 mg by mouth daily.    Yes [provider]  torsemide (DEMADEX) 20 MG  tablet Take 3 tablets (60 mg total) by mouth 2 (two) times daily. Patient taking differently: Take 60 mg by mouth 3 (three) times daily. 12/28/22  Yes Johnson, Clanford L, MD  traMADol (ULTRAM) 50 MG tablet Take 50 mg by mouth 4 (four) times daily as needed for moderate pain. 01/05/23  Yes [provider]  zolpidem (AMBIEN) 5 MG tablet Take 5 mg by mouth at bedtime. 03/03/19  Yes [provider]  doxycycline (VIBRA-TABS) 100 MG tablet Take 1 tablet (100 mg total) by mouth 2 (two) times daily. Patient not taking: Reported on 01/06/2023 12/09/22   Patel, Kevin P, DPM    ROS: All other systems have been reviewed and were otherwise negative with the exception of those mentioned in the HPI and as above.  Physical Exam: General: Alert, no acute distress Cardiovascular: No pedal edema Respiratory: No cyanosis, no use of accessory musculature GI: No organomegaly, abdomen is soft and non-tender Skin: No lesions in the area of chief complaint Neurologic: Sensation intact distally Psychiatric: Patient is competent for consent with normal mood and affect Lymphatic: No axillary or cervical lymphadenopathy  MUSCULOSKELETAL:  Range of motion of the shoulder is not able to be tested.  His deltoid function appears to be abnormal.  His axillary nerve sensation appears to be altered.  He is not able to really demonstrate firing of his deltoid secondary to pain.  Distal motor and sensory function appears to be completely intact.  Skin is quite thin.  Otherwise no obvious wounds around the shoulder.    Imaging: X-rays demonstrate on   multiple views a complex chronic fracture/dislocation of the shoulder with anterior and inferior dislocation.    BMI: There is no height or weight on file to calculate BMI.  Lab Results  Component Value Date   ALBUMIN 3.1 (L) 12/23/2022   Diabetes:   Patient has a diagnosis of diabetes,  Lab Results  Component Value Date   HGBA1C 9.3 (H) 12/22/2022    Smoking Status: Social History   Tobacco Use  Smoking Status Every Day   Packs/day: 1.00   Years: 41.00   Additional pack years: 0.00   Total pack years: 41.00   Types: Cigarettes   Start date: 05/03/1972   Passive exposure: Never  Smokeless Tobacco Never  Tobacco Comments   1/2 ppd 09/25/2022 hfb   Ready to quit: Not Answered Counseling given: Not Answered Tobacco comments: 1/2 ppd 09/25/2022 hfb  Urgent surgery due to dislocation         Assessment: left proximal humerus fracture , dislocation  Plan: Plan for Procedure(s): REVERSE SHOULDER ARTHROPLASTY  Unfortunately the patient does not have any other option than surgery.  His brachial plexus has clearly been injured with his dislocation and fracture.  I think it is appropriate to consider a hemiarthroplasty versus reverse total shoulder arthroplasty, depending on his overall cuff stability.  We will probably lean towards hemiarthroplasty if possible.  Risks, benefits and alternatives of surgery were discussed.  He has extraordinarily high risk of continued instability, infection and periprosthetic fracture amongst others.    The risks benefits and alternatives were discussed with the patient including but not limited to the risks of nonoperative treatment, versus surgical intervention including infection, bleeding, nerve injury,  blood clots, cardiopulmonary complications, morbidity, mortality, among others, and they were willing to proceed.   We additionally specifically discussed risks of axillary nerve injury, infection, periprosthetic fracture, continued pain and longevity of implants prior to beginning procedure.    Patient will be admitted for inpatient treatment for surgery, pain control, OT, prophylactic antibiotics, VTE prophylaxis, and discharge planning. The patient is planning to be discharged home with outpatient PT.   Patient has been admitted to hospitalist service prior to surgery. We appreciate their  assistance on the patient.   The patient acknowledged the explanation, agreed to proceed with the plan and consent was signed.   Operative Plan: Left hemiarthroplasty versus reverse total shoulder arthroplasty Discharge Medications: standard DVT Prophylaxis: resume plavix Physical Therapy: delayed outpatient PT Special Discharge needs: Sling. Iceman   Jendaya Gossett N Shiva Karis, PA-C  01/12/2023 4:54 PM  

## 2023-01-12 NOTE — Progress Notes (Signed)
Spoke to [employer] [employer] at Dr. Austin Miles office regarding status of surgery for 01/13/23.  She stated that they are going to do a direct admit today for patient as his surgery is urgent.  Sherri will call me back if there are any issues with direct admission.
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
## 2023-01-13 ENCOUNTER — Other Ambulatory Visit: Payer: Self-pay

## 2023-01-13 ENCOUNTER — Inpatient Hospital Stay (HOSPITAL_COMMUNITY): Payer: 59

## 2023-01-13 ENCOUNTER — Ambulatory Visit: Payer: 59 | Admitting: Podiatry

## 2023-01-13 ENCOUNTER — Ambulatory Visit (HOSPITAL_COMMUNITY): Admission: RE | Admit: 2023-01-13 | Payer: 59 | Source: Home / Self Care | Admitting: Orthopaedic Surgery

## 2023-01-13 ENCOUNTER — Encounter (HOSPITAL_COMMUNITY)
Admission: AD | Disposition: A | Discharge status: Home Health | Payer: Self-pay | Source: Ambulatory Visit | Attending: Internal Medicine

## 2023-01-13 ENCOUNTER — Inpatient Hospital Stay (HOSPITAL_COMMUNITY): Payer: 59 | Admitting: Certified Registered Nurse Anesthetist

## 2023-01-13 DIAGNOSIS — I5031 Acute diastolic (congestive) heart failure: Secondary | ICD-10-CM

## 2023-01-13 DIAGNOSIS — J9611 Chronic respiratory failure with hypoxia: Secondary | ICD-10-CM

## 2023-01-13 DIAGNOSIS — G4733 Obstructive sleep apnea (adult) (pediatric): Secondary | ICD-10-CM

## 2023-01-13 DIAGNOSIS — S42252A Displaced fracture of greater tuberosity of left humerus, initial encounter for closed fracture: Secondary | ICD-10-CM

## 2023-01-13 DIAGNOSIS — Z01810 Encounter for preprocedural cardiovascular examination: Secondary | ICD-10-CM | POA: Diagnosis not present

## 2023-01-13 DIAGNOSIS — I2510 Atherosclerotic heart disease of native coronary artery without angina pectoris: Secondary | ICD-10-CM

## 2023-01-13 DIAGNOSIS — S42402D Unspecified fracture of lower end of left humerus, subsequent encounter for fracture with routine healing: Secondary | ICD-10-CM

## 2023-01-13 DIAGNOSIS — S42412G Displaced simple supracondylar fracture without intercondylar fracture of left humerus, subsequent encounter for fracture with delayed healing: Secondary | ICD-10-CM | POA: Diagnosis not present

## 2023-01-13 DIAGNOSIS — I10 Essential (primary) hypertension: Secondary | ICD-10-CM

## 2023-01-13 DIAGNOSIS — E119 Type 2 diabetes mellitus without complications: Secondary | ICD-10-CM

## 2023-01-13 DIAGNOSIS — J449 Chronic obstructive pulmonary disease, unspecified: Secondary | ICD-10-CM

## 2023-01-13 DIAGNOSIS — I25118 Atherosclerotic heart disease of native coronary artery with other forms of angina pectoris: Secondary | ICD-10-CM | POA: Diagnosis not present

## 2023-01-13 DIAGNOSIS — I739 Peripheral vascular disease, unspecified: Secondary | ICD-10-CM

## 2023-01-13 DIAGNOSIS — S42292A Other displaced fracture of upper end of left humerus, initial encounter for closed fracture: Secondary | ICD-10-CM | POA: Diagnosis not present

## 2023-01-13 DIAGNOSIS — Z01818 Encounter for other preprocedural examination: Secondary | ICD-10-CM

## 2023-01-13 DIAGNOSIS — I2089 Other forms of angina pectoris: Secondary | ICD-10-CM | POA: Diagnosis not present

## 2023-01-13 DIAGNOSIS — S43005A Unspecified dislocation of left shoulder joint, initial encounter: Secondary | ICD-10-CM | POA: Diagnosis not present

## 2023-01-13 DIAGNOSIS — F17210 Nicotine dependence, cigarettes, uncomplicated: Secondary | ICD-10-CM

## 2023-01-13 DIAGNOSIS — Z9989 Dependence on other enabling machines and devices: Secondary | ICD-10-CM

## 2023-01-13 HISTORY — PX: REVERSE SHOULDER ARTHROPLASTY: SHX5054

## 2023-01-13 LAB — BASIC METABOLIC PANEL WITH GFR
Anion gap: 10 (ref 5–15)
BUN: 9 mg/dL (ref 8–23)
CO2: 22 mmol/L (ref 22–32)
Calcium: 8.7 mg/dL — ABNORMAL LOW (ref 8.9–10.3)
Chloride: 104 mmol/L (ref 98–111)
Creatinine, Ser: 1.08 mg/dL (ref 0.61–1.24)
GFR, Estimated: >60 mL/min
Glucose, Bld: 161 mg/dL — ABNORMAL HIGH (ref 70–99)
Potassium: 4.7 mmol/L (ref 3.5–5.1)
Sodium: 136 mmol/L (ref 135–145)

## 2023-01-13 LAB — ECHOCARDIOGRAM COMPLETE
AR max vel: 2.38 cm2
AV Area VTI: 2.48 cm2
AV Area mean vel: 2.35 cm2
AV Mean grad: 5 mmHg
AV Peak grad: 9.5 mmHg
Ao pk vel: 1.54 m/s
Area-P 1/2: 2.29 cm2
Calc EF: 50.6 %
Height: 69 in
MV VTI: 4.12 cm2
S' Lateral: 3.9 cm
Single Plane A2C EF: 52.8 %
Single Plane A4C EF: 51.6 %
Weight: 3294.55 oz

## 2023-01-13 LAB — CBC
HCT: 43.3 % (ref 39.0–52.0)
Hemoglobin: 13.6 g/dL (ref 13.0–17.0)
MCH: 27 pg (ref 26.0–34.0)
MCHC: 31.4 g/dL (ref 30.0–36.0)
MCV: 86.1 fL (ref 80.0–100.0)
Platelets: 172 10*3/uL (ref 150–400)
RBC: 5.03 MIL/uL (ref 4.22–5.81)
RDW: 13.5 % (ref 11.5–15.5)
WBC: 7.1 10*3/uL (ref 4.0–10.5)
nRBC: 0 % (ref 0.0–0.2)

## 2023-01-13 LAB — GLUCOSE, CAPILLARY
Glucose-Capillary: 63 mg/dL — ABNORMAL LOW (ref 70–99)
Glucose-Capillary: 78 mg/dL (ref 70–99)
Glucose-Capillary: 75 mg/dL (ref 70–99)
Glucose-Capillary: 90 mg/dL (ref 70–99)
Glucose-Capillary: 132 mg/dL — ABNORMAL HIGH (ref 70–99)
Glucose-Capillary: 254 mg/dL — ABNORMAL HIGH (ref 70–99)

## 2023-01-13 LAB — BASIC METABOLIC PANEL
Anion gap: 7 (ref 5–15)
BUN: 11 mg/dL (ref 8–23)
CO2: 25 mmol/L (ref 22–32)
Calcium: 8.4 mg/dL — ABNORMAL LOW (ref 8.9–10.3)
Chloride: 107 mmol/L (ref 98–111)
Creatinine, Ser: 1.18 mg/dL (ref 0.61–1.24)
GFR, Estimated: >60 mL/min (ref >60)
Glucose, Bld: 58 mg/dL — ABNORMAL LOW (ref 70–99)
Potassium: 3.6 mmol/L (ref 3.5–5.1)
Sodium: 139 mmol/L (ref 135–145)

## 2023-01-13 SURGERY — ARTHROPLASTY, SHOULDER, TOTAL, REVERSE
Anesthesia: General | Site: Shoulder | Laterality: Left

## 2023-01-13 MED ORDER — PHENOL 1.4 % MT LIQD: 1.0000 | OROMUCOSAL | Status: DC | PRN

## 2023-01-13 MED ORDER — ROCURONIUM BROMIDE 10 MG/ML (PF) SYRINGE
PREFILLED_SYRINGE | INTRAVENOUS | Status: AC
  Filled 2023-01-13: qty 10

## 2023-01-13 MED ORDER — INSULIN ASPART 100 UNIT/ML IJ SOLN
0.0000 [IU] | INTRAMUSCULAR | Status: DC
  Administered 2023-01-13: 5 [IU] via SUBCUTANEOUS
  Administered 2023-01-14 (×2): 2 [IU] via SUBCUTANEOUS
  Administered 2023-01-14: 3 [IU] via SUBCUTANEOUS
  Administered 2023-01-14: 2 [IU] via SUBCUTANEOUS
  Administered 2023-01-15: 3 [IU] via SUBCUTANEOUS
  Administered 2023-01-15 (×2): 1 [IU] via SUBCUTANEOUS
  Administered 2023-01-15: 2 [IU] via SUBCUTANEOUS
  Administered 2023-01-15: 1 [IU] via SUBCUTANEOUS
  Administered 2023-01-15: 3 [IU] via SUBCUTANEOUS
  Administered 2023-01-16 – 2023-01-17 (×4): 2 [IU] via SUBCUTANEOUS
  Administered 2023-01-17 (×2): 1 [IU] via SUBCUTANEOUS

## 2023-01-13 MED ORDER — HYDROCODONE-ACETAMINOPHEN 7.5-325 MG PO TABS
1.0000 | ORAL_TABLET | ORAL | Status: DC | PRN
  Administered 2023-01-13 – 2023-01-19 (×15): 1 via ORAL
  Filled 2023-01-13 (×15): qty 1

## 2023-01-13 MED ORDER — CEFAZOLIN SODIUM-DEXTROSE 2-4 GM/100ML-% IV SOLN
2.0000 g | INTRAVENOUS | Status: AC
  Administered 2023-01-13: 2 g via INTRAVENOUS
  Filled 2023-01-13: qty 100

## 2023-01-13 MED ORDER — ISOSORBIDE MONONITRATE ER 30 MG PO TB24
30.0000 mg | ORAL_TABLET | Freq: Every evening | ORAL | Status: DC
  Administered 2023-01-13 – 2023-01-14 (×2): 30 mg via ORAL
  Filled 2023-01-13 (×2): qty 1

## 2023-01-13 MED ORDER — ALPRAZOLAM 0.5 MG PO TABS
1.0000 mg | ORAL_TABLET | Freq: Three times a day (TID) | ORAL | Status: DC
  Administered 2023-01-13 – 2023-01-19 (×16): 1 mg via ORAL
  Filled 2023-01-13 (×16): qty 2

## 2023-01-13 MED ORDER — CEFAZOLIN SODIUM-DEXTROSE 2-4 GM/100ML-% IV SOLN: 2.0000 g | Freq: Once | INTRAVENOUS | Status: DC

## 2023-01-13 MED ORDER — INSULIN REGULAR HUMAN (CONC) 500 UNIT/ML ~~LOC~~ SOLN: 45.0000 [IU] | Freq: Three times a day (TID) | SUBCUTANEOUS | Status: DC

## 2023-01-13 MED ORDER — INSULIN ASPART 100 UNIT/ML IJ SOLN: 0.0000 [IU] | INTRAMUSCULAR | Status: DC | PRN

## 2023-01-13 MED ORDER — ONDANSETRON HCL 4 MG/2ML IJ SOLN
INTRAMUSCULAR | Status: AC
  Filled 2023-01-13: qty 2

## 2023-01-13 MED ORDER — PHENYLEPHRINE 80 MCG/ML (10ML) SYRINGE FOR IV PUSH (FOR BLOOD PRESSURE SUPPORT)
PREFILLED_SYRINGE | INTRAVENOUS | Status: DC | PRN
  Administered 2023-01-13 (×2): 160 ug via INTRAVENOUS

## 2023-01-13 MED ORDER — GABAPENTIN 300 MG PO CAPS
600.0000 mg | ORAL_CAPSULE | Freq: Three times a day (TID) | ORAL | Status: DC
  Administered 2023-01-13 – 2023-01-14 (×4): 600 mg via ORAL
  Filled 2023-01-13 (×4): qty 2

## 2023-01-13 MED ORDER — SODIUM CHLORIDE 0.9 % IR SOLN
Status: DC | PRN
  Administered 2023-01-13: 1000 mL

## 2023-01-13 MED ORDER — TRANEXAMIC ACID-NACL 1000-0.7 MG/100ML-% IV SOLN: 1000.0000 mg | INTRAVENOUS | Status: DC

## 2023-01-13 MED ORDER — VANCOMYCIN HCL 1 G IV SOLR
INTRAVENOUS | Status: DC | PRN
  Administered 2023-01-13: 1000 mg via TOPICAL

## 2023-01-13 MED ORDER — HYDROCODONE-ACETAMINOPHEN 5-325 MG PO TABS
1.0000 | ORAL_TABLET | ORAL | Status: DC | PRN
  Administered 2023-01-14 – 2023-01-15 (×2): 1 via ORAL
  Filled 2023-01-13 (×2): qty 1

## 2023-01-13 MED ORDER — BUPIVACAINE LIPOSOME 1.3 % IJ SUSP
INTRAMUSCULAR | Status: AC
  Filled 2023-01-13: qty 10

## 2023-01-13 MED ORDER — BUPIVACAINE LIPOSOME 1.3 % IJ SUSP
INTRAMUSCULAR | Status: DC | PRN
  Administered 2023-01-13: 10 mL

## 2023-01-13 MED ORDER — CEFAZOLIN SODIUM-DEXTROSE 2-4 GM/100ML-% IV SOLN
2.0000 g | Freq: Four times a day (QID) | INTRAVENOUS | Status: AC
  Administered 2023-01-13 – 2023-01-14 (×3): 2 g via INTRAVENOUS
  Filled 2023-01-13 (×3): qty 100

## 2023-01-13 MED ORDER — LACTATED RINGERS IV SOLN: INTRAVENOUS | Status: DC

## 2023-01-13 MED ORDER — MAGNESIUM CITRATE PO SOLN: 1.0000 | Freq: Once | ORAL | Status: DC | PRN

## 2023-01-13 MED ORDER — BUPIVACAINE HCL (PF) 0.5 % IJ SOLN
INTRAMUSCULAR | Status: DC | PRN
  Administered 2023-01-13: 30 mL

## 2023-01-13 MED ORDER — ALBUTEROL SULFATE HFA 108 (90 BASE) MCG/ACT IN AERS
INHALATION_SPRAY | RESPIRATORY_TRACT | Status: AC
  Filled 2023-01-13: qty 6.7

## 2023-01-13 MED ORDER — MENTHOL 3 MG MT LOZG: 1.0000 | LOZENGE | OROMUCOSAL | Status: DC | PRN

## 2023-01-13 MED ORDER — ROCURONIUM BROMIDE 10 MG/ML (PF) SYRINGE
PREFILLED_SYRINGE | INTRAVENOUS | Status: DC | PRN
  Administered 2023-01-13: 40 mg via INTRAVENOUS

## 2023-01-13 MED ORDER — CHLORHEXIDINE GLUCONATE 0.12 % MT SOLN: 15.0000 mL | Freq: Once | OROMUCOSAL | Status: DC

## 2023-01-13 MED ORDER — 0.9 % SODIUM CHLORIDE (POUR BTL) OPTIME
TOPICAL | Status: DC | PRN
  Administered 2023-01-13: 1000 mL

## 2023-01-13 MED ORDER — ALBUTEROL SULFATE HFA 108 (90 BASE) MCG/ACT IN AERS
INHALATION_SPRAY | RESPIRATORY_TRACT | Status: DC | PRN
  Administered 2023-01-13: 4 via RESPIRATORY_TRACT

## 2023-01-13 MED ORDER — PHENYLEPHRINE HCL-NACL 20-0.9 MG/250ML-% IV SOLN
INTRAVENOUS | Status: DC | PRN
  Administered 2023-01-13: 40 ug/min via INTRAVENOUS

## 2023-01-13 MED ORDER — HYDROMORPHONE HCL 1 MG/ML IJ SOLN
0.2500 mg | INTRAMUSCULAR | Status: DC | PRN
  Administered 2023-01-13 (×4): 0.5 mg via INTRAVENOUS

## 2023-01-13 MED ORDER — STERILE WATER FOR IRRIGATION IR SOLN
Status: DC | PRN
  Administered 2023-01-13: 2000 mL

## 2023-01-13 MED ORDER — HYDROMORPHONE HCL 1 MG/ML IJ SOLN
INTRAMUSCULAR | Status: AC
  Filled 2023-01-13: qty 1

## 2023-01-13 MED ORDER — INSULIN REGULAR HUMAN (CONC) 500 UNIT/ML ~~LOC~~ SOPN
45.0000 [IU] | PEN_INJECTOR | Freq: Three times a day (TID) | SUBCUTANEOUS | Status: DC
  Administered 2023-01-13 – 2023-01-19 (×17): 45 [IU] via SUBCUTANEOUS

## 2023-01-13 MED ORDER — SUCCINYLCHOLINE CHLORIDE 200 MG/10ML IV SOSY
PREFILLED_SYRINGE | INTRAVENOUS | Status: DC | PRN
  Administered 2023-01-13: 120 mg via INTRAVENOUS

## 2023-01-13 MED ORDER — ONDANSETRON HCL 4 MG PO TABS: 4.0000 mg | ORAL_TABLET | Freq: Four times a day (QID) | ORAL | Status: DC | PRN

## 2023-01-13 MED ORDER — SUCCINYLCHOLINE CHLORIDE 200 MG/10ML IV SOSY
PREFILLED_SYRINGE | INTRAVENOUS | Status: AC
  Filled 2023-01-13: qty 10

## 2023-01-13 MED ORDER — DIPHENHYDRAMINE HCL 12.5 MG/5ML PO ELIX: 12.5000 mg | ORAL_SOLUTION | ORAL | Status: DC | PRN

## 2023-01-13 MED ORDER — FENTANYL CITRATE (PF) 100 MCG/2ML IJ SOLN
INTRAMUSCULAR | Status: AC
  Filled 2023-01-13: qty 2

## 2023-01-13 MED ORDER — VANCOMYCIN HCL 1000 MG IV SOLR
INTRAVENOUS | Status: AC
  Filled 2023-01-13: qty 20

## 2023-01-13 MED ORDER — REVEFENACIN 175 MCG/3ML IN SOLN
175.0000 ug | Freq: Every day | RESPIRATORY_TRACT | Status: DC
  Administered 2023-01-13 – 2023-01-19 (×7): 175 ug via RESPIRATORY_TRACT
  Filled 2023-01-13 (×8): qty 3

## 2023-01-13 MED ORDER — ONDANSETRON HCL 4 MG/2ML IJ SOLN
4.0000 mg | Freq: Once | INTRAMUSCULAR | Status: AC | PRN
  Administered 2023-01-13: 4 mg via INTRAVENOUS

## 2023-01-13 MED ORDER — TRANEXAMIC ACID-NACL 1000-0.7 MG/100ML-% IV SOLN
1000.0000 mg | INTRAVENOUS | Status: AC
  Administered 2023-01-13: 1000 mg via INTRAVENOUS
  Filled 2023-01-13: qty 100

## 2023-01-13 MED ORDER — DEXTROSE 50 % IV SOLN
INTRAVENOUS | Status: AC
  Filled 2023-01-13: qty 50

## 2023-01-13 MED ORDER — ONDANSETRON HCL 4 MG/2ML IJ SOLN: 4.0000 mg | Freq: Four times a day (QID) | INTRAMUSCULAR | Status: DC | PRN

## 2023-01-13 MED ORDER — DEXAMETHASONE SODIUM PHOSPHATE 4 MG/ML IJ SOLN
INTRAMUSCULAR | Status: DC | PRN
  Administered 2023-01-13: 5 mg via INTRAVENOUS

## 2023-01-13 MED ORDER — TORSEMIDE 20 MG PO TABS
60.0000 mg | ORAL_TABLET | Freq: Two times a day (BID) | ORAL | Status: DC
  Administered 2023-01-13 – 2023-01-14 (×3): 60 mg via ORAL
  Filled 2023-01-13 (×3): qty 3

## 2023-01-13 MED ORDER — LIDOCAINE HCL (PF) 2 % IJ SOLN
INTRAMUSCULAR | Status: AC
  Filled 2023-01-13: qty 5

## 2023-01-13 MED ORDER — ACETAMINOPHEN 500 MG PO TABS: 1000.0000 mg | ORAL_TABLET | Freq: Once | ORAL | Status: DC

## 2023-01-13 MED ORDER — DIPHENHYDRAMINE HCL 25 MG PO CAPS
50.0000 mg | ORAL_CAPSULE | Freq: Every day | ORAL | Status: DC
  Administered 2023-01-13 – 2023-01-18 (×6): 50 mg via ORAL
  Filled 2023-01-13 (×6): qty 2

## 2023-01-13 MED ORDER — POTASSIUM CHLORIDE CRYS ER 20 MEQ PO TBCR
20.0000 meq | EXTENDED_RELEASE_TABLET | Freq: Two times a day (BID) | ORAL | Status: DC
  Administered 2023-01-13 – 2023-01-14 (×3): 20 meq via ORAL
  Filled 2023-01-13 (×3): qty 1

## 2023-01-13 MED ORDER — DOCUSATE SODIUM 100 MG PO CAPS
100.0000 mg | ORAL_CAPSULE | Freq: Two times a day (BID) | ORAL | Status: DC
  Administered 2023-01-13 – 2023-01-19 (×10): 100 mg via ORAL
  Filled 2023-01-13 (×12): qty 1

## 2023-01-13 MED ORDER — DEXTROSE 50 % IV SOLN
12.5000 g | INTRAVENOUS | Status: AC
  Administered 2023-01-13: 12.5 g via INTRAVENOUS

## 2023-01-13 MED ORDER — ISOSORBIDE MONONITRATE ER 60 MG PO TB24
60.0000 mg | ORAL_TABLET | Freq: Every day | ORAL | Status: DC
  Administered 2023-01-14 – 2023-01-15 (×2): 60 mg via ORAL
  Filled 2023-01-13 (×2): qty 1

## 2023-01-13 MED ORDER — BUDESONIDE 0.5 MG/2ML IN SUSP
0.5000 mg | Freq: Two times a day (BID) | RESPIRATORY_TRACT | Status: DC
  Administered 2023-01-13 – 2023-01-19 (×12): 0.5 mg via RESPIRATORY_TRACT
  Filled 2023-01-13 (×13): qty 2

## 2023-01-13 MED ORDER — ACETAMINOPHEN 500 MG PO TABS
500.0000 mg | ORAL_TABLET | Freq: Four times a day (QID) | ORAL | Status: AC
  Administered 2023-01-13 – 2023-01-14 (×3): 500 mg via ORAL
  Filled 2023-01-13 (×3): qty 1

## 2023-01-13 MED ORDER — ORAL CARE MOUTH RINSE: 15.0000 mL | Freq: Once | OROMUCOSAL | Status: DC

## 2023-01-13 MED ORDER — KETAMINE HCL 10 MG/ML IJ SOLN
INTRAMUSCULAR | Status: DC | PRN
  Administered 2023-01-13: 30 mg via INTRAVENOUS
  Administered 2023-01-13: 20 mg via INTRAVENOUS

## 2023-01-13 MED ORDER — DEXAMETHASONE SODIUM PHOSPHATE 10 MG/ML IJ SOLN
INTRAMUSCULAR | Status: AC
  Filled 2023-01-13: qty 1

## 2023-01-13 MED ORDER — BUPIVACAINE HCL (PF) 0.5 % IJ SOLN
INTRAMUSCULAR | Status: AC
  Filled 2023-01-13: qty 30

## 2023-01-13 MED ORDER — PROPOFOL 10 MG/ML IV BOLUS
INTRAVENOUS | Status: AC
  Filled 2023-01-13: qty 20

## 2023-01-13 MED ORDER — MORPHINE SULFATE (PF) 2 MG/ML IV SOLN: 0.5000 mg | INTRAVENOUS | Status: DC | PRN

## 2023-01-13 MED ORDER — PROPOFOL 10 MG/ML IV BOLUS
INTRAVENOUS | Status: DC | PRN
  Administered 2023-01-13: 100 mg via INTRAVENOUS

## 2023-01-13 MED ORDER — ONDANSETRON HCL 4 MG/2ML IJ SOLN
INTRAMUSCULAR | Status: DC | PRN
  Administered 2023-01-13: 4 mg via INTRAVENOUS

## 2023-01-13 MED ORDER — FLUOXETINE HCL 20 MG PO CAPS
40.0000 mg | ORAL_CAPSULE | Freq: Every day | ORAL | Status: DC
  Administered 2023-01-13 – 2023-01-19 (×7): 40 mg via ORAL
  Filled 2023-01-13 (×7): qty 2

## 2023-01-13 MED ORDER — FENTANYL CITRATE (PF) 100 MCG/2ML IJ SOLN
INTRAMUSCULAR | Status: DC | PRN
  Administered 2023-01-13: 100 ug via INTRAVENOUS

## 2023-01-13 MED ORDER — SUGAMMADEX SODIUM 200 MG/2ML IV SOLN
INTRAVENOUS | Status: DC | PRN
  Administered 2023-01-13: 200 mg via INTRAVENOUS

## 2023-01-13 SURGICAL SUPPLY — 69 items
AID PSTN UNV HD RSTRNT DISP (MISCELLANEOUS) ×1
APL PRP STRL LF DISP 70% ISPRP (MISCELLANEOUS) ×2
BAG COUNTER SPONGE SURGICOUNT (BAG) ×1 IMPLANT
BAG SPNG CNTER NS LX DISP (BAG) ×1
BIT DRILL CANN 2.7 (BIT) ×1
BIT DRILL SRG 2.7XCANN AO CPLG (BIT) IMPLANT
BIT DRL SRG 2.7XCANN AO CPLNG (BIT) ×1
BLADE SAW SGTL 73X25 THK (BLADE) ×1 IMPLANT
BLADE SAW SGTL 81X20 HD (BLADE) IMPLANT
CHLORAPREP W/TINT 26 (MISCELLANEOUS) ×2 IMPLANT
CLSR STERI-STRIP ANTIMIC 1/2X4 (GAUZE/BANDAGES/DRESSINGS) ×1 IMPLANT
COOLER ICEMAN CLASSIC (MISCELLANEOUS) IMPLANT
COVER BACK TABLE 60X90IN (DRAPES) ×1 IMPLANT
COVER SURGICAL LIGHT HANDLE (MISCELLANEOUS) ×1 IMPLANT
DRAPE C-ARM 42X120 X-RAY (DRAPES) IMPLANT
DRAPE INCISE IOBAN 66X45 STRL (DRAPES) ×1 IMPLANT
DRAPE ORTHO SPLIT 77X108 STRL (DRAPES) ×2
DRAPE POUCH INSTRU U-SHP 10X18 (DRAPES) ×1 IMPLANT
DRAPE SHEET LG 3/4 BI-LAMINATE (DRAPES) ×2 IMPLANT
DRAPE SURG ORHT 6 SPLT 77X108 (DRAPES) ×2 IMPLANT
DRSG AQUACEL AG ADV 3.5X 6 (GAUZE/BANDAGES/DRESSINGS) ×1 IMPLANT
ELECT BLADE TIP CTD 4 INCH (ELECTRODE) ×1 IMPLANT
ELECT PENCIL ROCKER SW 15FT (MISCELLANEOUS) IMPLANT
ELECT REM PT RETURN 15FT ADLT (MISCELLANEOUS) ×1 IMPLANT
FACESHIELD WRAPAROUND (MASK) ×2 IMPLANT
FACESHIELD WRAPAROUND OR TEAM (MASK) ×2 IMPLANT
GAUZE XEROFORM 1X8 LF (GAUZE/BANDAGES/DRESSINGS) IMPLANT
GLOVE BIO SURGEON STRL SZ 6.5 (GLOVE) ×2 IMPLANT
GLOVE BIOGEL PI IND STRL 6.5 (GLOVE) ×1 IMPLANT
GLOVE BIOGEL PI IND STRL 8 (GLOVE) ×1 IMPLANT
GLOVE ECLIPSE 8.0 STRL XLNG CF (GLOVE) ×2 IMPLANT
GOWN STRL REUS W/ TWL LRG LVL3 (GOWN DISPOSABLE) ×2 IMPLANT
GOWN STRL REUS W/TWL LRG LVL3 (GOWN DISPOSABLE) ×2
HANDPIECE INTERPULSE COAX TIP (DISPOSABLE) ×1
HEAD HUMERAL AEQUALIS 48X18 (Head) ×1 IMPLANT
HEAD HUMERAL HIGH OS 48X18 (Head) IMPLANT
K-WIRE ORTHOPEDIC 1.4X150L (WIRE) ×1
KIT BASIN OR (CUSTOM PROCEDURE TRAY) ×1 IMPLANT
KIT STABILIZATION SHOULDER (MISCELLANEOUS) ×1 IMPLANT
KIT TURNOVER KIT A (KITS) IMPLANT
KWIRE ORTHOPEDIC 1.4X150L (WIRE) IMPLANT
MANIFOLD NEPTUNE II (INSTRUMENTS) ×1 IMPLANT
NDL MA TROC 1/2 (NEEDLE) IMPLANT
NEEDLE MA TROC 1/2 (NEEDLE) ×1 IMPLANT
NS IRRIG 1000ML POUR BTL (IV SOLUTION) ×1 IMPLANT
PACK SHOULDER (CUSTOM PROCEDURE TRAY) ×1 IMPLANT
PAD COLD SHLDR WRAP-ON (PAD) IMPLANT
RESTRAINT HEAD UNIVERSAL NS (MISCELLANEOUS) ×1 IMPLANT
SCREW CANN PT RVRS CUT FLT 34X (Screw) IMPLANT
SCREW CANN PT RVRS CUT FLT 42X (Screw) IMPLANT
SCREW CANNULATED 4.0X34MM (Screw) ×1 IMPLANT
SCREW CANNULATED 4.0X42MM (Screw) ×2 IMPLANT
SET HNDPC FAN SPRY TIP SCT (DISPOSABLE) ×1 IMPLANT
SLING ARM IMMOBILIZER LRG (SOFTGOODS) IMPLANT
SPONGE T-LAP 4X18 ~~LOC~~+RFID (SPONGE) ×1 IMPLANT
STEM HUMERAL SZ4BX104 132.5D (Joint) IMPLANT
SUCTION TUBE FRAZIER 12FR DISP (SUCTIONS) IMPLANT
SUT ETHIBOND 2 V 37 (SUTURE) ×1 IMPLANT
SUT ETHIBOND NAB CT1 #1 30IN (SUTURE) ×1 IMPLANT
SUT FIBERWIRE #5 38 CONV NDL (SUTURE) ×3
SUT MNCRL AB 4-0 PS2 18 (SUTURE) ×1 IMPLANT
SUT VIC AB 0 CT1 36 (SUTURE) IMPLANT
SUT VIC AB 3-0 SH 27 (SUTURE) ×1
SUT VIC AB 3-0 SH 27X BRD (SUTURE) ×1 IMPLANT
SUTURE FIBERWR #5 38 CONV NDL (SUTURE) ×2 IMPLANT
TOWEL OR 17X26 10 PK STRL BLUE (TOWEL DISPOSABLE) ×1 IMPLANT
TUBE SUCTION HIGH CAP CLEAR NV (SUCTIONS) ×1 IMPLANT
WASHER ASNIS III 4.0 SCR (Washer) IMPLANT
WATER STERILE IRR 1000ML POUR (IV SOLUTION) ×2 IMPLANT

## 2023-01-13 NOTE — Interval H&P Note (Signed)
All questions answered, patient wants to proceed with procedure. ? ?

## 2023-01-13 NOTE — Anesthesia Postprocedure Evaluation (Signed)
Anesthesia Post Note  Patient: [name]  Procedure(s) Performed: OPEN REDUCTION OF CHRONIC SHOULDER DISLOCATION, OPEN REDUCTION INTERNAL FIXATION OF TUBULAR FRACTURE, LEFT SHOULDER HEMIPLASTY, LATERJET AUGMENTATION (Left: Shoulder)     Patient location during evaluation: PACU Anesthesia Type: General Level of consciousness: awake and alert Pain management: pain level controlled Vital Signs Assessment: post-procedure vital signs reviewed and stable Respiratory status: spontaneous breathing, nonlabored ventilation, respiratory function stable and patient connected to nasal cannula oxygen Cardiovascular status: blood pressure returned to baseline and stable Postop Assessment: no apparent nausea or vomiting Anesthetic complications: no  No notable events documented.  Last Vitals:  Vitals:   01/13/23 1630 01/13/23 1645  BP: (!) 142/67 (!) 149/72  Pulse: 78 77  Resp: 15 16  Temp:    SpO2: 96% 95%    Last Pain:  Vitals:   01/13/23 1645  TempSrc:   PainSc: Asleep                 Amarachi Kotz S

## 2023-01-13 NOTE — Anesthesia Preprocedure Evaluation (Signed)
Anesthesia Evaluation  Patient identified by MRN, date of birth, ID band Patient awake    Reviewed: Allergy & Precautions, H&P , NPO status , Patient's Chart, lab work & pertinent test results  Airway Mallampati: III  TM Distance: >3 FB Neck ROM: Limited    Dental no notable dental hx.    Pulmonary sleep apnea, Continuous Positive Airway Pressure Ventilation and Oxygen sleep apnea , COPD,  oxygen dependent, Current Smoker   breath sounds clear to auscultation + decreased breath sounds      Cardiovascular hypertension, + CAD  Normal cardiovascular exam Rhythm:Regular Rate:Normal     Neuro/Psych CVA, Residual Symptoms  negative psych ROS   GI/Hepatic Neg liver ROS,GERD  ,,  Endo/Other  negative endocrine ROSdiabetes    Renal/GU negative Renal ROS  negative genitourinary   Musculoskeletal negative musculoskeletal ROS (+)    Abdominal   Peds negative pediatric ROS (+)  Hematology negative hematology ROS (+)   Anesthesia Other Findings   Reproductive/Obstetrics negative OB ROS                             Anesthesia Physical Anesthesia Plan  ASA: 4  Anesthesia Plan: General   Post-op Pain Management: Ketamine IV*   Induction: Intravenous  PONV Risk Score and Plan: 1 and Ondansetron, Dexamethasone and Treatment may vary due to age or medical condition  Airway Management Planned: Oral ETT  Additional Equipment:   Intra-op Plan:   Post-operative Plan: Possible Post-op intubation/ventilation  Informed Consent: I have reviewed the patients History and Physical, chart, labs and discussed the procedure including the risks, benefits and alternatives for the proposed anesthesia with the patient or authorized representative who has indicated his/her understanding and acceptance.    Suspend DNR.   Dental advisory given  Plan Discussed with: CRNA and Surgeon  Anesthesia Plan Comments:  (Discussed possibility of post operative ventilation with patient. Expresses understanding)       Anesthesia Quick Evaluation

## 2023-01-13 NOTE — Consult Note (Addendum)
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 remission: Has not used any substances for more than 10 years   Risk Assessment/Risk Scores:                For questions or updates, please contact [facility] Please consult [email or web address] for contact info under    Signed, Azalee Course, [place]  01/13/2023 8:57 AM  I have seen and examined the patient along with Azalee Course, PA .  I have reviewed the chart, notes and new data.  I agree with PA/NP's note.  Key new complaints: He does not currently have angina pectoris.  He did have angina pectoris walking to the mailbox 2 weeks ago, but for him that is an unusual degree of exertion.  He rarely does it.  He does not have angina with activities of daily living such as dressing or personal hygiene.  CCS functional class II.  He is not  on beta-blockers due to advanced chronic lung disease. Key examination changes: Normal cardiovascular exam.  Diminished breath sounds throughout.  No active wheezing. Key new findings / data: There is subtle T wave inversion leads V1-V2 on the current ECG which was not present on ECGs from November and December 2023.  Reviewed the echocardiogram that was just performed.  He has normal left ventricular systolic function and does not have any serious valvular abnormalities.  The very mild hypokinesis of the inferolateral wall, in the distribution of the severely diseased left circumflex coronary artery.  Septal, anterior wall and apical wall motion is normal (the LAD artery territory).  Borderline elevated systolic PA pressure.  PLAN: This is a high risk patient lead due to his poor functional status and advanced chronic lung disease.  He has symptoms of stable angina pectoris, he does not have symptoms of an unstable coronary syndrome.  There are some subtle changes on his electrocardiogram, that do not necessarily identify the territory that is ischemic. It is most likely that his angina is due to ischemia in the territory of the left circumflex coronary artery, that is not amenable to revascularization. Fortunately, the surgery that he is scheduled for is low risk from a cardiac point of view.  He is in a lot of pain due to his shoulder problem and is eager to go through the surgery.  Cannot take beta-blockers due to his lung problems I do not think that there are preoperative interventions that would further reduce his cardiac risk, without a potentially very long delay in repair of his dislocated shoulder (even if we did identify a severe coronary blockage that were amenable to revascularization, PCI would be followed by at least 3 months of uninterrupted dual antiplatelet therapy.).  Consequently, I think he should proceed with the planned surgery today.  Important to avoid wide swings in hemodynamics  during his surgical procedure and avoid hypoxemia.  Would like to repeat his electrocardiogram after surgery.    Thurmon Fair, MD, [facility] [phone] 01/13/2023, 1:20 PM '

## 2023-01-13 NOTE — Inpatient Diabetes Management (Signed)
Inpatient Diabetes Program Recommendations  AACE/ADA: New Consensus Statement on Inpatient Glycemic Control (2015)  Target Ranges:  Prepandial:   less than 140 mg/dL      Peak postprandial:   less than 180 mg/dL (1-2 hours)      Critically ill patients:  140 - 180 mg/dL   Lab Results  Component Value Date   GLUCAP 90 01/13/2023   HGBA1C 9.3 (H) 12/22/2022    Review of Glycemic Control  Diabetes history: DM2 Outpatient Diabetes medications: U-500 90 units TID Current orders for Inpatient glycemic control: Novolog 0-9 units Q4H  Inpatient Diabetes Program Recommendations:    Consider restarting U-500 at 50% of home dose - 45 units TID (when po intake resumes)  Add Novolog 0-20 units TID with meals and 0-5 HS  Pt reports he has made multiple appts, but has not been able to see an endocrinologist. DM managed by PCP Monitors 3x/day.  Pt states he never has lows at home.  Had mild hypos this am d/t NPO status. Drinking regular Cokes that he brought from home. "I don't like diet sodas." Discussed importance of good glycemic control to reduce risks of complications.   Follow while inpatient.  Thank you. Ailene Ards, RD, LDN, CDCES Inpatient Diabetes Coordinator [phone]
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
## 2023-01-13 NOTE — Progress Notes (Signed)
Hypoglycemic Event  CBG: 63  Treatment: D50 25 mL (12.5 gm)  Symptoms: Hungry  Follow-up CBG: Time: 0440 CBG Result: 78  Possible Reasons for Event: Inadequate meal intake  Comments/MD notified:James Daniels,NP    Montel Clock

## 2023-01-13 NOTE — TOC Initial Note (Signed)
Transition of Care [facility]) - Initial/Assessment Note   Patient Details  Name: [name] MRN: [id number] Date of Birth: 03-Nov-1955  Transition of [facility]) CM/SW Contact:    Ewing Schlein, LCSW Phone Number: 01/13/2023, 10:54 AM  Clinical Narrative: Patient to receive surgery and, per chart review, the plan is to discharge home with OP therapy. TOC following for possible discharge needs.  Expected Discharge Plan: OP Rehab Barriers to Discharge: Continued Medical Work up  Expected Discharge Plan and Services In-house Referral: Clinical Social Work Living arrangements for the past 2 months: Single Family Home  Prior Living Arrangements/Services Living arrangements for the past 2 months: Single Family Home Patient language and need for interpreter reviewed:: Yes Need for Family Participation in Patient Care: No (Comment) Care giver support system in place?: Yes (comment) Criminal Activity/Legal Involvement Pertinent to Current Situation/Hospitalization: No - Comment as needed  Activities of Daily Living Home Assistive Devices/Equipment: Environmental consultant (specify type), Wheelchair, Eyeglasses, Dentures (specify type) (cane) ADL Screening (condition at time of admission) Patient's cognitive ability adequate to safely complete daily activities?: Yes Is the patient deaf or have difficulty hearing?: No Does the patient have difficulty seeing, even when wearing glasses/contacts?: No Does the patient have difficulty concentrating, remembering, or making decisions?: No Does the patient have difficulty dressing or bathing?: No Independently performs ADLs?: No Communication: Independent Dressing (OT): Needs assistance Is this a change from baseline?: Pre-admission baseline Grooming: Needs assistance Is this a change from baseline?: Pre-admission baseline Feeding: Needs assistance Bathing: Needs assistance Is this a change from baseline?: Pre-admission baseline Toileting: Needs assistance Is  this a change from baseline?: Pre-admission baseline In/Out Bed: Needs assistance Is this a change from baseline?: Pre-admission baseline Walks in Home: Needs assistance Is this a change from baseline?: Pre-admission baseline Does the patient have difficulty walking or climbing stairs?: Yes Weakness of Legs: None Weakness of Arms/Hands: None  Emotional Assessment Orientation: : Oriented to Self, Oriented to Place, Oriented to  Time, Oriented to Situation Alcohol / Substance Use: Not Applicable Psych Involvement: No (comment)  Admission diagnosis:  Humerus fracture [S42.309A] Patient Active Problem List   Diagnosis Date Noted   Humerus fracture 01/12/2023   Nausea 12/23/2022   Generalized weakness 12/22/2022   Oropharyngeal candidiasis 02/11/2022   Palliative care encounter 01/06/2022   Abnormal CT scan, bladder 01/06/2022   Single kidney 01/06/2022   Diarrhea 10/23/2021   Osteomyelitis ([name]) 07/15/2021   History of complete ray amputation of first toe of left foot ([name]) 04/24/2021   Osteomyelitis of second toe of left foot ([name]) 04/24/2021   Diabetic foot ulcer ([name]) 12/24/2020   Benign prostatic hyperplasia 11/08/2020   Body mass index (BMI) 38.0-38.9, adult 11/08/2020   Chronic diastolic heart failure ([name]) 11/08/2020   Chronic pain 11/08/2020   Diabetic peripheral neuropathy associated with type 2 diabetes mellitus ([name]) 11/08/2020   Diastolic dysfunction 11/08/2020   GERD without esophagitis 11/08/2020   Hearing loss 11/08/2020   Incontinence of feces 11/08/2020   Long term (current) use of insulin ([name]) 11/08/2020   Loss of appetite 11/08/2020   Nicotine dependence, unspecified, uncomplicated 11/08/2020   Peripheral neuropathy 11/08/2020   Personal history of other malignant neoplasm of kidney 11/08/2020   Severe hyperglycemia due to diabetes mellitus ([name]) 11/08/2020   Urinary tract obstruction 11/08/2020   Asymptomatic varicose veins of bilateral lower extremities  11/08/2020   Chronic respiratory failure with hypoxia ([name]) 12/23/2018   UTI symptoms 11/08/2018   Abdominal distention 11/08/2018   Chest pain 11/08/2018  Hyperglycemia due to diabetes mellitus ([name]) 11/08/2018   Polypharmacy 06/03/2018   Diabetes mellitus due to underlying condition, uncontrolled, with hyperglycemia, with long-term current use of insulin ([name]) 06/03/2018   Decreased mobility and endurance 07/19/2015   CKD (chronic kidney disease) stage 3, GFR 30-59 ml/min ([name]) 07/06/2015   Interstitial lung disease ([name]) 04/10/2015   Cough syncope 04/10/2015   Dyspnea 04/10/2015   Atypical chest pain 02/28/2015   Syncope 02/28/2015   Orthostatic dizziness 02/25/2015   Right leg pain 02/25/2015   Right calf pain 02/14/2015   Malaise and fatigue 02/14/2015   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[name]) 11/12/2014   Smoking 11/12/2014   History of stroke 10/24/2014   Carotid disease, bilateral ([name]) 10/24/2014   Orthostatic hypotension 10/24/2014   Obstructive sleep apnea    Mixed hyperlipidemia    Acute on chronic renal failure ([name])    Renal cell carcinoma ([name])    Depression    Anxiety state    Noncompliance with medication regimen    Carotid artery stenosis 10/08/2014   Dizziness 09/26/2014   Left sided numbness 09/26/2014   Hyperreflexia 09/26/2014   Essential hypertension 09/26/2014   COPD (chronic obstructive pulmonary disease) ([name]) 08/16/2014   ILD (interstitial lung disease) ([name]) 08/16/2014   Dizziness and giddiness 08/16/2014   Other specified hypotension 08/16/2014   Snoring 04/30/2014   Fluid overload 02/22/2014   Renal neoplasm 02/21/2014   Preoperative cardiovascular examination 02/07/2014   Renal mass, right 12/21/2013   CAD in native artery    Type 2 diabetes mellitus ([name])    Substance abuse ([name])    Hyperlipidemia    Diabetes mellitus type 2, uncontrolled, with cardiovascular complications 06/01/2012   Tobacco abuse 06/01/2012   H/O medication  noncompliance 06/01/2012   Cocaine abuse ([name]) 06/01/2012   Essential hypertension, benign    PCP:  [name], [name], FNP Pharmacy:   [facility] [place], [place] - [address] [place] [address] Phone: [phone] Fax: [phone]  Social Determinants of Health (SDOH) Social History: SDOH Screenings   Food Insecurity: No Food Insecurity (01/12/2023)  Housing: Patient Declined (01/12/2023)  Transportation Needs: No Transportation Needs (01/12/2023)  Utilities: Not At Risk (01/12/2023)  Depression (PHQ2-9): Low Risk  (04/24/2022)  Financial Resource Strain: Low Risk  (05/19/2019)  Physical Activity: Inactive (05/19/2019)  Social Connections: Unknown (05/19/2019)  Stress: No Stress Concern Present (05/19/2019)  Tobacco Use: High Risk (12/27/2022)   SDOH Interventions:    Readmission Risk Interventions    01/13/2023   10:44 AM  Readmission Risk Prevention Plan  Transportation Screening Complete  HRI or Home Care Consult Complete  Social Work Consult for Recovery Care Planning/Counseling Complete  Palliative Care Screening Not Applicable  Medication Review Oceanographer) Complete

## 2023-01-13 NOTE — Transfer of Care (Signed)
Immediate Anesthesia Transfer of Care Note  Patient: ZAKI GERTSCH  Procedure(s) Performed: OPEN REDUCTION OF CHRONIC SHOULDER DISLOCATION, OPEN REDUCTION INTERNAL FIXATION OF TUBULAR FRACTURE, LEFT SHOULDER HEMIPLASTY, LATERJET AUGMENTATION (Left: Shoulder)  Patient Location: PACU  Anesthesia Type:General  Level of Consciousness: drowsy and patient cooperative  Airway & Oxygen Therapy: Patient Spontanous Breathing and Patient connected to face mask oxygen  Post-op Assessment: Report given to RN and Post -op Vital signs reviewed and stable  Post vital signs: Reviewed and stable  Last Vitals:  Vitals Value Taken Time  BP 168/75 01/13/23 1600  Temp    Pulse 73 01/13/23 1603  Resp 27 01/13/23 1603  SpO2 99 % 01/13/23 1603  Vitals shown include unvalidated device data.  Last Pain:  Vitals:   01/13/23 1128  TempSrc: Oral  PainSc:       Patients Stated Pain Goal: 2 (01/13/23 8756)  Complications: No notable events documented.

## 2023-01-14 ENCOUNTER — Encounter (HOSPITAL_COMMUNITY): Payer: Self-pay | Admitting: Internal Medicine

## 2023-01-14 ENCOUNTER — Ambulatory Visit: Payer: 59 | Admitting: Nurse Practitioner

## 2023-01-14 DIAGNOSIS — E119 Type 2 diabetes mellitus without complications: Secondary | ICD-10-CM

## 2023-01-14 DIAGNOSIS — I10 Essential (primary) hypertension: Secondary | ICD-10-CM | POA: Diagnosis not present

## 2023-01-14 DIAGNOSIS — Z794 Long term (current) use of insulin: Secondary | ICD-10-CM

## 2023-01-14 DIAGNOSIS — S42402D Unspecified fracture of lower end of left humerus, subsequent encounter for fracture with routine healing: Secondary | ICD-10-CM | POA: Diagnosis not present

## 2023-01-14 LAB — GLUCOSE, CAPILLARY
Glucose-Capillary: 175 mg/dL — ABNORMAL HIGH (ref 70–99)
Glucose-Capillary: 177 mg/dL — ABNORMAL HIGH (ref 70–99)
Glucose-Capillary: 177 mg/dL — ABNORMAL HIGH (ref 70–99)
Glucose-Capillary: 222 mg/dL — ABNORMAL HIGH (ref 70–99)
Glucose-Capillary: 79 mg/dL (ref 70–99)
Glucose-Capillary: 87 mg/dL (ref 70–99)

## 2023-01-14 MED ORDER — TAMSULOSIN HCL 0.4 MG PO CAPS
0.4000 mg | ORAL_CAPSULE | Freq: Every day | ORAL | Status: DC
  Administered 2023-01-14 – 2023-01-19 (×6): 0.4 mg via ORAL
  Filled 2023-01-14 (×6): qty 1

## 2023-01-14 MED ORDER — CLOPIDOGREL BISULFATE 75 MG PO TABS
75.0000 mg | ORAL_TABLET | Freq: Every day | ORAL | Status: DC
  Administered 2023-01-14 – 2023-01-19 (×6): 75 mg via ORAL
  Filled 2023-01-14 (×6): qty 1

## 2023-01-14 NOTE — Plan of Care (Signed)
  Problem: Activity: Goal: Risk for activity intolerance will decrease Outcome: Progressing   Problem: Nutrition: Goal: Adequate nutrition will be maintained Outcome: Progressing   Problem: Coping: Goal: Level of anxiety will decrease Outcome: Progressing   

## 2023-01-14 NOTE — Plan of Care (Signed)
  Problem: Safety: Goal: Ability to remain free from injury will improve Outcome: Progressing   Problem: Education: Goal: Knowledge of the prescribed therapeutic regimen will improve Outcome: Progressing   Problem: Activity: Goal: Ability to tolerate increased activity will improve Outcome: Progressing   Problem: Pain Management: Goal: Pain level will decrease with appropriate interventions Outcome: Progressing

## 2023-01-14 NOTE — Discharge Instructions (Signed)
Ramond Marrow MD, MPH Alfonse Alpers, PA-C [facility] [address] [phone] (tel)   [phone] (fax)   POST-OPERATIVE INSTRUCTIONS - TOTAL SHOULDER REPLACEMENT    WOUND CARE You may leave the operative dressing in place until your follow-up appointment. KEEP THE INCISIONS CLEAN AND DRY. There may be a small amount of fluid/bleeding leaking at the surgical site. This is normal after surgery.  If it fills with liquid or blood please call us immediately to change it for you. Use the provided ice machine or Ice packs as often as possible for the first 3-4 days, then as needed for pain relief.   Keep a layer of cloth or a shirt between your skin and the cooling unit to prevent frost bite as it can get very cold.  SHOWERING: - You may shower on Post-Op Day #2.  - The dressing is water resistant but do not scrub it as it may start to peel up.   - You may remove the sling for showering - Gently pat the area dry.  - Do not soak the shoulder in water.  - Do not go swimming in the pool or ocean until your incision has completely healed (about 4-6 weeks after surgery) - KEEP THE INCISIONS CLEAN AND DRY.  EXERCISES Wear the sling at all times  You may remove the sling for showering, but keep the arm across the chest or in a secondary sling.    Accidental/Purposeful External Rotation and shoulder flexion (reaching behind you) is to be avoided at all costs for the first month.   It is normal for your fingers/hand to become more swollen after surgery and discolored from bruising.   This will resolve over the first few weeks usually after surgery. Please continue to ambulate and do not stay sitting or lying for too long.  Perform foot and wrist pumps to assist in circulation.  PHYSICAL THERAPY - No therapy for 4 weeks after surgery  REGIONAL ANESTHESIA (NERVE BLOCKS) The anesthesia team may have performed a nerve block for you this is a great tool used  to minimize pain.   The block may start wearing off overnight (between 8-24 hours postop) When the block wears off, your pain may go from nearly zero to the pain you would have had postop without the block. This is an abrupt transition but nothing dangerous is happening.   This can be a challenging period but utilize your as needed pain medications to try and manage this period. We suggest you use the pain medication the first night prior to going to bed, to ease this transition.  You may take an extra dose of narcotic when this happens if needed   POST-OP MEDICATIONS- Multimodal approach to pain control In general your pain will be controlled with a combination of substances.  Prescriptions unless otherwise discussed are electronically sent to your pharmacy.  This is a carefully made plan we use to minimize narcotic use.     Acetaminophen - Non-narcotic pain medicine taken on a scheduled basis  Oxycodone - This is a strong narcotic, to be used only on an "as needed" basis for SEVERE pain. Zofran -  take as needed for nausea   FOLLOW-UP If you develop a Fever (>101.5), Redness or Drainage from the surgical incision site, please call our office to arrange for an evaluation. Please call the office to schedule a follow-up appointment for a wound check, 7-10 days post-operatively.  IF YOU HAVE ANY QUESTIONS, PLEASE  FEEL FREE TO CALL OUR OFFICE.  HELPFUL INFORMATION  Your arm will be in a sling following surgery. You will be in this sling for the next 4 weeks.   You may be more comfortable sleeping in a semi-seated position the first few nights following surgery.  Keep a pillow propped under the elbow and forearm for comfort.  If you have a recliner type of chair it might be beneficial.  If not that is fine too, but it would be helpful to sleep propped up with pillows behind your operated shoulder as well under your elbow and forearm.  This will reduce pulling on the suture lines.  When  dressing, put your operative arm in the sleeve first.  When getting undressed, take your operative arm out last.  Loose fitting, button-down shirts are recommended.  In most states it is against the law to drive while your arm is in a sling. And certainly against the law to drive while taking narcotics.  You may return to work/school in the next couple of days when you feel up to it. Desk work and typing in the sling is fine.  We suggest you use the pain medication the first night prior to going to bed, in order to ease any pain when the anesthesia wears off. You should avoid taking pain medications on an empty stomach as it will make you nauseous.  You should wean off your narcotic medicines as soon as you are able.     Most patients will be off narcotics before their first postop appointment.   Do not drink alcoholic beverages or take illicit drugs when taking pain medications.  Pain medication may make you constipated.  Below are a few solutions to try in this order: Decrease the amount of pain medication if you aren't having pain. Drink lots of decaffeinated fluids. Drink prune juice and/or each dried prunes  If the first 3 don't work start with additional solutions Take Colace - an over-the-counter stool softener Take Senokot - an over-the-counter laxative Take Miralax - a stronger over-the-counter laxative   Dental Antibiotics:  In most cases prophylactic antibiotics for Dental procdeures after total joint surgery are not necessary.  Exceptions are as follows:  1. History of prior total joint infection  2. Severely immunocompromised (Organ Transplant, cancer chemotherapy, Rheumatoid biologic meds such as Humera)  3. Poorly controlled diabetes (A1C &gt; 8.0, blood glucose over 200)  If you have one of these conditions, contact your surgeon for an antibiotic prescription, prior to your dental procedure.   For more information including helpful videos and documents visit  our website:   [email or web address]
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
## 2023-01-14 NOTE — Telephone Encounter (Signed)
He's admitted for humerus fracture with plans for surgical repair. They need to consult PCCM for clearance, if they want pulmonary's input. Thanks.

## 2023-01-14 NOTE — Telephone Encounter (Signed)
Closing encounter. NFN 

## 2023-01-14 NOTE — Evaluation (Signed)
Occupational Therapy Evaluation Patient Details Name: [name] MRN: [id number] DOB: Dec 16, 1955 Today's Date: 01/14/2023   History of Present Illness [name] is a 67 y.o. male with medical history significant of chronic respiratory failure with hypoxemia on supplemental oxygen at 6LPM, COPD, type 2 diabetes mellitus with diabetic neuropathy, CAD s/p stent placement, essential hypertension, GERD, diastolic CHF hyperlipidemia, sleep apnea not on CPAP admitted on 01/13/23 for L shoulder hemiarthoplasty due to humeral fx sustain in a fall on 6/9.   Clinical Impression   Pt lives with his mother and is her caregiver. He has an aide 5 days a week, who will assist with personal care, but typically helps with IADLs. Pt has neighbors who will assist with getting groceries as needed. Pt typically walks with a cane or RW. All education completed and entire ADL with pt, reinforced compensatory strategies, sling use, positioning and NWB on L UE with written handouts. Will keep on caseload to reinforce.     Recommendations for follow up therapy are one component of a multi-disciplinary discharge planning process, led by the attending physician.  Recommendations may be updated based on patient status, additional functional criteria and insurance authorization.   Assistance Recommended at Discharge Intermittent Supervision/Assistance  Patient can return home with the following A little help with walking and/or transfers;Assistance with cooking/housework;Assist for transportation;Help with stairs or ramp for entrance;A little help with bathing/dressing/bathroom    Functional Status Assessment  Patient has had a recent decline in their functional status and demonstrates the ability to make significant improvements in function in a reasonable and predictable amount of time.  Equipment Recommendations  None recommended by OT    Recommendations for Other Services       Precautions / Restrictions  Precautions Precautions: Fall;Shoulder Type of Shoulder Precautions: no L shoulder ROM, elbow to hand only Shoulder Interventions: Shoulder sling/immobilizer;Off for dressing/bathing/exercises Precaution Booklet Issued: Yes (comment) Required Braces or Orthoses: Sling Restrictions Weight Bearing Restrictions: Yes LUE Weight Bearing: Non weight bearing      Mobility Bed Mobility Overal bed mobility: Modified Independent             General bed mobility comments: toward R side of bed    Transfers Overall transfer level: Needs assistance Equipment used: None Transfers: Sit to/from Stand Sit to Stand: Supervision           General transfer comment: stabilized on IV pole      Balance Overall balance assessment: Needs assistance   Sitting balance-Leahy Scale: Good       Standing balance-Leahy Scale: Poor                             ADL either performed or assessed with clinical judgement   ADL Overall ADL's : Needs assistance/impaired Eating/Feeding: Independent;Sitting   Grooming: Modified independent;Standing;Sitting   Upper Body Bathing: Minimal assistance;Sitting   Lower Body Bathing: Minimal assistance;Sit to/from stand   Upper Body Dressing : Minimal assistance;Sitting       Toilet Transfer: Min guard;Ambulation;BSC/3in1 Statistician Details (indicate cue type and reason): pushed IV pole Toileting- Clothing Manipulation and Hygiene: Set up;Sitting/lateral lean       Functional mobility during ADLs: Min guard (pushing IV pole) General ADL Comments: Educated in compensatory strategies for ADLs, positioning L UE in bed and chair, sling use, NWB status.     Vision Baseline Vision/History: 1 Wears glasses Ability to See in Adequate Light: 0 Adequate Patient Visual  Report: No change from baseline       Perception     Praxis      Pertinent Vitals/Pain Pain Assessment Pain Assessment: Faces Faces Pain Scale: No hurt      Hand Dominance Right   Extremity/Trunk Assessment Upper Extremity Assessment Upper Extremity Assessment: LUE deficits/detail LUE Deficits / Details: nerve block still partially intact LUE Coordination: decreased gross motor   Lower Extremity Assessment Lower Extremity Assessment: Overall WFL for tasks assessed   Cervical / Trunk Assessment Cervical / Trunk Assessment: Normal   Communication Communication Communication: No difficulties   Cognition Arousal/Alertness: Awake/alert Behavior During Therapy: WFL for tasks assessed/performed Overall Cognitive Status: Within Functional Limits for tasks assessed                                       General Comments       Exercises     Shoulder Instructions      Home Living Family/patient expects to be discharged to:: Private residence Living Arrangements: Parent Available Help at Discharge: Personal care attendant;Family;Available PRN/intermittently (PCS worker 2 hours/5 days a week) Type of Home: Mobile home Home Access: Ramped entrance     Home Layout: One level     Bathroom Shower/Tub: Producer, television/film/video: Handicapped height     Home Equipment: Agricultural consultant (2 wheels);Rollator (4 wheels);Shower seat;Wheelchair - manual;Electric scooter;Hospital bed;Grab bars - tub/shower;Hand held shower head          Prior Functioning/Environment Prior Level of Function : Needs assist             Mobility Comments: household ambulator using SPC PRN, or RW for longer distances ADLs Comments: Home aides 2 hours/day x 5 days/week; assisted for bathing, dressing, and IADL's, pt is the caregiver of his mom        OT Problem List: Impaired balance (sitting and/or standing);Decreased activity tolerance;Impaired UE functional use      OT Treatment/Interventions: Self-care/ADL training;Therapeutic exercise;Therapeutic activities;Patient/family education    OT Goals(Current goals can be  found in the care plan section) Acute Rehab OT Goals OT Goal Formulation: With patient Time For Goal Achievement: 01/28/23 Potential to Achieve Goals: Good ADL Goals Additional ADL Goal #1: Pt will complete basic ADLs mod I. Additional ADL Goal #2: Pt will don and doff L UE sling independently. Additional ADL Goal #3: Pt will demonstrate independence in L UE AROM elbow to hand.  OT Frequency: Min 1X/week    Co-evaluation              AM-PAC OT "6 Clicks" Daily Activity     Outcome Measure Help from another person eating meals?: None Help from another person taking care of personal grooming?: None Help from another person toileting, which includes using toliet, bedpan, or urinal?: None Help from another person bathing (including washing, rinsing, drying)?: A Little Help from another person to put on and taking off regular upper body clothing?: A Little Help from another person to put on and taking off regular lower body clothing?: A Little 6 Click Score: 21   End of Session Equipment Utilized During Treatment: Gait belt;Oxygen (6L)  Activity Tolerance: Patient tolerated treatment well Patient left: in chair;with call bell/phone within reach;with chair alarm set  OT Visit Diagnosis: Unsteadiness on feet (R26.81);Other abnormalities of gait and mobility (R26.89);Muscle weakness (generalized) (M62.81)  Time: 1610-9604 OT Time Calculation (min): 58 min Charges:  OT General Charges $OT Visit: 1 Visit OT Evaluation $OT Eval Moderate Complexity: 1 Mod OT Treatments $Self Care/Home Management : 38-52 mins  Berna Spare, OTR/L Acute Rehabilitation Services Office: [phone]   Evern Bio 01/14/2023, 12:03 PM

## 2023-01-14 NOTE — Progress Notes (Addendum)
Patient Name: [name] Date of Encounter: 01/14/2023 [facility] Cardiologist: Nona Dell, MD   Interval Summary  .    67 y.o. male with a hx of CAD s/p DES to LAD 03/09/2013, PAD s/p L SFA stenting 06/2021, carotid artery disease s/p R CEA, HTN, HLD, DM II, COPD on 6 L/min [place] 24/7, tobacco abuse, h/o CVA, renal cell carcinoma s/p R nephrectomy and remote history of substance use in remission was seen 01/13/23 for pre-op clearance for reverse shoulder arthroplasty and was cleared by cardiology with no angina with usual activities though high risk.    Pt underwent planned surgery without complications.  Today  no chest pain, no SOB having some shoulder pain but improved from yesterday.      Vital Signs .    Vitals:   01/13/23 2124 01/14/23 0052 01/14/23 0538 01/14/23 0800  BP: 130/85 122/81 121/80   Pulse: 83 84 77   Resp: 18 17 19    Temp: (!) 97.5 F (36.4 C) 97.7 F (36.5 C) 97.7 F (36.5 C)   TempSrc: Oral     SpO2: 97% 98% 98% 99%  Weight:      Height:        Intake/Output Summary (Last 24 hours) at 01/14/2023 0856 Last data filed at 01/14/2023 0600 Gross per 24 hour  Intake 2193.23 ml  Output 700 ml  Net 1493.23 ml      01/12/2023   12:15 PM 12/22/2022   12:28 PM 12/10/2022    1:42 PM  Last 3 Weights  Weight (lbs) 205 lb 14.6 oz 222 lb 6.4 oz 222 lb 6.4 oz  Weight (kg) 93.4 kg 100.88 kg 100.88 kg      Telemetry/ECG    SR with PACs  - Personally Reviewed  EKG personally reviewed I ordered but not yet done   Echo 01/13/23 EF 55-60%, mild concentric LVH G1DD  RV normal, LA mildly dilated, trivial MR, + aortic sclerosis no stenosis.   Physical Exam .   GEN: No acute distress.   Neck: No JVD Cardiac: RRR, no murmurs, rubs, or gallops.  Respiratory: Clear to auscultation bilaterally. GI: Soft, nontender, non-distended  MS: No edema lower ext, should arm in sling  Assessment & Plan .     POD 1 reverse shoulder arthoplasty per ortho  CAD  with hx of DES to LAD 2014 and abnormal EKG  --plavix on hold for surgery resume per surgery  --repeat EKG today  --no angina currently hx of angina with more than usual exertion  PAD with hx LSFA stent/carotid disease with hx R CEA --stable  HTN/HLD/DM2/COPD on home 02/tobacco use/ hx CVA per IM  --BP stable 121/80   Hx Renal cell carcinoma with hx Rt nephrectomy --Cr 1.08    For questions or updates, please contact [facility] Please consult [email or web address] for contact info under        Signed, Nada Boozer, NP   I have seen and examined the patient along with Nada Boozer, NP.  I have reviewed the chart, notes and new data.  I agree with PA/NP's note.  Key new complaints: did well w surgery from CV standpoint. Notable improvement in BP. Key examination changes: normal CV exam Key new findings / data: ECG shows similar anteroseptal T wave inversion as preop  PLAN: Since he is asymptomatic, no plan for active workup during this admission. Resume usual home CV meds. Has an appt w Sharlene Dory, NP 01/20/2023 8:30 AM.  Rachelle Hora  Adriona Kaney, MD, [facility] [phone] 01/14/2023, 12:32 PM

## 2023-01-14 NOTE — Progress Notes (Signed)
   ORTHOPAEDIC PROGRESS NOTE  s/p Procedure(s): OPEN REDUCTION OF CHRONIC SHOULDER DISLOCATION, OPEN REDUCTION INTERNAL FIXATION OF TUBULAR FRACTURE, LEFT SHOULDER HEMIPLASTY, LATERJET AUGMENTATION  SUBJECTIVE: Reports moderate pain about operative site. He feels better than he did with his shoulder dislocated.   No chest pain. No SOB. No nausea/vomiting. No other complaints.  OBJECTIVE: PE: General: sitting up in hospital bed, NAD Cardiac: regular rate Pulmonary: no increased work of breathing LUE: Dressing CDI and sling well fitting,  full and painless ROM throughout hand with DPC of 0.  He is able to perform distal motor function in AIN, PIN distributions. He reports paresthesias in the left hand - worse ulnar nerve distribution. He continues to have numbness in axillary nerve distribution.    Vitals:   01/14/23 0052 01/14/23 0538  BP: 122/81 121/80  Pulse: 84 77  Resp: 17 19  Temp: 97.7 F (36.5 C) 97.7 F (36.5 C)  SpO2: 98% 98%   Stable post-op images.   ASSESSMENT: [name] is a 67 y.o. male POD#1  PLAN: Weightbearing: NWB LUE Insicional and dressing care: Reinforce dressings as needed Orthopedic device(s):  Sling Showering: hold for now VTE prophylaxis: Resume Plavix. Ambulation. SCDs. Pain control: PRN pain medications, minimize narcotics as able Follow - up plan: 1 week in office Dispo: TBD. Okay to discharge once cleared by medicine and therapy teams.   Contact information:  Weekdays 8-5 [facility] PA-C , After hours and holidays please check [email or web address] for group call information for [facility]  Alfonse Alpers, PA-C 01/14/2023

## 2023-01-14 NOTE — Plan of Care (Signed)
  Problem: Safety: Goal: Ability to remain free from injury will improve Outcome: Progressing   Problem: Education: Goal: Knowledge of the prescribed therapeutic regimen will improve Outcome: Progressing   Problem: Activity: Goal: Ability to tolerate increased activity will improve Outcome: Progressing   Problem: Pain Management: Goal: Pain level will decrease with appropriate interventions Outcome: Progressing   

## 2023-01-15 DIAGNOSIS — N179 Acute kidney failure, unspecified: Secondary | ICD-10-CM

## 2023-01-15 DIAGNOSIS — I2510 Atherosclerotic heart disease of native coronary artery without angina pectoris: Secondary | ICD-10-CM | POA: Diagnosis not present

## 2023-01-15 DIAGNOSIS — S42402D Unspecified fracture of lower end of left humerus, subsequent encounter for fracture with routine healing: Secondary | ICD-10-CM | POA: Diagnosis not present

## 2023-01-15 LAB — CBC
HCT: 39.2 % (ref 39.0–52.0)
Hemoglobin: 12.5 g/dL — ABNORMAL LOW (ref 13.0–17.0)
MCH: 26.9 pg (ref 26.0–34.0)
MCHC: 31.9 g/dL (ref 30.0–36.0)
MCV: 84.3 fL (ref 80.0–100.0)
Platelets: 214 10*3/uL (ref 150–400)
RBC: 4.65 MIL/uL (ref 4.22–5.81)
RDW: 13.7 % (ref 11.5–15.5)
WBC: 11.5 10*3/uL — ABNORMAL HIGH (ref 4.0–10.5)
nRBC: 0 % (ref 0.0–0.2)

## 2023-01-15 LAB — GLUCOSE, CAPILLARY
Glucose-Capillary: 126 mg/dL — ABNORMAL HIGH (ref 70–99)
Glucose-Capillary: 131 mg/dL — ABNORMAL HIGH (ref 70–99)
Glucose-Capillary: 134 mg/dL — ABNORMAL HIGH (ref 70–99)
Glucose-Capillary: 172 mg/dL — ABNORMAL HIGH (ref 70–99)
Glucose-Capillary: 206 mg/dL — ABNORMAL HIGH (ref 70–99)
Glucose-Capillary: 207 mg/dL — ABNORMAL HIGH (ref 70–99)
Glucose-Capillary: 98 mg/dL (ref 70–99)

## 2023-01-15 LAB — BASIC METABOLIC PANEL
Anion gap: 11 (ref 5–15)
BUN: 31 mg/dL — ABNORMAL HIGH (ref 8–23)
CO2: 23 mmol/L (ref 22–32)
Calcium: 8.4 mg/dL — ABNORMAL LOW (ref 8.9–10.3)
Chloride: 98 mmol/L (ref 98–111)
Creatinine, Ser: 2.1 mg/dL — ABNORMAL HIGH (ref 0.61–1.24)
GFR, Estimated: 34 mL/min — ABNORMAL LOW (ref >60)
Glucose, Bld: 114 mg/dL — ABNORMAL HIGH (ref 70–99)
Potassium: 4.6 mmol/L (ref 3.5–5.1)
Sodium: 132 mmol/L — ABNORMAL LOW (ref 135–145)

## 2023-01-15 MED ORDER — LACTATED RINGERS IV SOLN: INTRAVENOUS | Status: AC

## 2023-01-15 MED ORDER — GABAPENTIN 300 MG PO CAPS
300.0000 mg | ORAL_CAPSULE | Freq: Every day | ORAL | Status: DC
  Administered 2023-01-15 – 2023-01-18 (×4): 300 mg via ORAL
  Filled 2023-01-15 (×4): qty 1

## 2023-01-15 MED ORDER — ONDANSETRON HCL 4 MG PO TABS: 4.0000 mg | ORAL_TABLET | Freq: Three times a day (TID) | ORAL | 0 refills | Status: AC | PRN

## 2023-01-15 MED ORDER — ACETAMINOPHEN 500 MG PO TABS: 1000.0000 mg | ORAL_TABLET | Freq: Three times a day (TID) | ORAL | 0 refills | Status: AC

## 2023-01-15 MED ORDER — OXYCODONE HCL 5 MG PO TABS: ORAL_TABLET | ORAL | 0 refills | Status: DC

## 2023-01-15 MED ORDER — SODIUM CHLORIDE 0.9 % IV BOLUS
250.0000 mL | Freq: Once | INTRAVENOUS | Status: AC
  Administered 2023-01-15: 250 mL via INTRAVENOUS

## 2023-01-15 NOTE — Evaluation (Signed)
Physical Therapy Evaluation Patient Details Name: [name] MRN: [id number] DOB: 12-06-1955 Today's Date: 01/15/2023  History of Present Illness  67 yo male presents to therapy s/p surgical repair of L humeral fx with pt undergoing L TSA, L greater tuberosity ORIF, open reduction of L shoulder, and augmentation of L shoulder anterior gelnoid with autograft bone on 01/13/2023. Pt sustained fall on 12/27/2022 and sustained irreducible L shoulder dislocation. Pt is currently in sling at all times, L UE NWB and no L shoulder ROM. Pt had recent hospital admission 6/4-6/6 due to diarrhea and progressive weakness with dx of gastroenteritis. 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  Clinical Impression   [name] is a 67 y.o. male  POD 2 s/p L humeral ORIF and L TSA. Patient reports mod I with mobility at baseline. Patient is now limited by functional impairments (see PT problem list below) and requires S for supine to sit for bed mobility and min A  for transfers. Patient was unable to safely ambulate with trial using quad cane in R UE due to symptomatic orthostatic hypotension and noted B LE instability. Nursing staff made PT aware of pt soft Bp at rest and PT assessed for orthostatic hypotension with positive findings, please see below. Pt has fall hx and remains fall risk. Pt ed provided on L UE restrictions, PT donned sling and ed provided on elevating L UE to address edema and use of iceman for pain and edema management. Pt left seated in recliner, all needs in place set up for lunch and nursing aware of BP findings. Patient will benefit from continued skilled PT interventions to  address impairments and progress towards PLOF. Acute PT will follow to progress mobility and stair training in preparation for safe discharge to next venue.      Recommendations for follow up therapy are one component of a multi-disciplinary discharge planning process, led by the attending physician.  Recommendations may be updated based on patient status, additional functional criteria and insurance authorization.  Follow Up Recommendations Can patient physically be transported by private vehicle: No     Assistance Recommended at Discharge Intermittent Supervision/Assistance  Patient can return home with the following  A little help with bathing/dressing/bathroom;Help with stairs or ramp for entrance;Assistance with cooking/housework;Assist for transportation;A lot of help with walking and/or transfers    Equipment Recommendations Cane  Recommendations for Other Services       Functional Status Assessment Patient has had a recent decline in their functional status and demonstrates the ability to make significant improvements in function in a reasonable and predictable amount of time.     Precautions / Restrictions Precautions Precautions: Fall;Shoulder Type of Shoulder Precautions: no L shoulder ROM, elbow to hand only Shoulder Interventions: Shoulder sling/immobilizer;Off for dressing/bathing/exercises Precaution Booklet Issued: Yes (comment) Required Braces or Orthoses: Sling Restrictions Weight Bearing Restrictions: Yes LUE Weight Bearing: Non weight bearing      Mobility  Bed Mobility Overal bed mobility: Modified Independent Bed Mobility: Supine to Sit     Supine to sit: Supervision, HOB elevated     General bed mobility comments: toward R side of bed    Transfers Overall transfer level: Needs assistance  Equipment used: None Transfers: Sit to/from [employer] to Stand: Min guard   Step pivot transfers: Min guard       General transfer comment: use of quad  cane R UE, c/o dizzines and noted LE instabiltiy    Ambulation/Gait               General Gait Details: NT due to symptomatic othostatic hypotension  Stairs            Wheelchair Mobility    Modified Rankin (Stroke Patients Only)       Balance Overall balance assessment: Needs assistance Sitting-balance support: Feet supported, No upper extremity supported Sitting balance-Leahy Scale: Fair Sitting balance - Comments: seated at EOB, difficulty maintaining LLE on floor and posteior lean   Standing balance support: During functional activity, Single extremity supported, Reliant on assistive device for balance Standing balance-Leahy Scale: Poor Standing balance comment: use of posterior B LEs on bed to stabilize in static standing                             Pertinent Vitals/Pain Pain Assessment Pain Assessment: Faces Faces Pain Scale: Hurts even more Breathing: normal Pain Location: L UE Pain Descriptors / Indicators: Aching, Constant, Discomfort, Operative site guarding Pain Intervention(s): Limited activity within patient's tolerance, Monitored during session, Premedicated before session, Repositioned, Ice applied    Home Living Family/patient expects to be discharged to:: Private residence Living Arrangements: Parent Available Help at Discharge: Personal care attendant;Family;Available PRN/intermittently (PCS worker 2 hours/5 days a week) Type of Home: Mobile home Home Access: Ramped entrance       Home Layout: One level Home Equipment: Agricultural consultant (2 wheels);Rollator (4 wheels);Shower seat;Wheelchair - manual;Electric scooter;Hospital bed;Grab bars - tub/shower;Hand held shower head      Prior Function Prior Level of Function : Needs assist       Physical Assist : ADLs (physical) Mobility (physical): Bed mobility;Transfers;Gait;Stairs ADLs (physical): IADLs;Bathing;Dressing Mobility Comments: pt reports use of rollator or SPC ADLs  Comments: Home aides 2 hours/day x 5 days/week; assisted for bathing, dressing, and IADL's, pt is the caregiver of his mom     Hand Dominance   Dominant Hand: Right    Extremity/Trunk Assessment        Lower Extremity Assessment Lower Extremity Assessment: Overall WFL for tasks assessed (hx of peripherial neuropathy, L foot transmetatarsal amputation and does not have custom orthotic shoes at this time)    Cervical / Trunk Assessment Cervical / Trunk Assessment: Normal  Communication   Communication: No difficulties  Cognition Arousal/Alertness: Awake/alert Behavior During Therapy: WFL for tasks assessed/performed Overall Cognitive Status: Within Functional Limits for tasks assessed                                          General Comments      Exercises     Assessment/Plan    PT Assessment Patient needs continued PT services  PT Problem List Decreased strength;Decreased activity tolerance;Decreased balance;Decreased mobility       PT Treatment Interventions DME instruction;Gait training;Stair training;Functional mobility training;Therapeutic activities;Therapeutic exercise;Patient/family education;Balance training;Modalities    PT Goals (Current goals can be found in the Care Plan section)  Acute Rehab PT Goals Patient Stated Goal: return home with family and home aides to assist PT Goal Formulation: With patient Time For Goal Achievement: 12/25/22 Potential to  Achieve Goals: Good    Frequency Min 1X/week     Co-evaluation               AM-PAC PT "6 Clicks" Mobility  Outcome Measure Help needed turning from your back to your side while in a flat bed without using bedrails?: A Little Help needed moving from lying on your back to sitting on the side of a flat bed without using bedrails?: A Little Help needed moving to and from a bed to a chair (including a wheelchair)?: A Little Help needed standing up from a chair using your arms  (e.g., wheelchair or bedside chair)?: A Little Help needed to walk in hospital room?: Total Help needed climbing 3-5 steps with a railing? : Total 6 Click Score: 14    End of Session Equipment Utilized During Treatment: Oxygen Activity Tolerance: Patient limited by fatigue;Treatment limited secondary to medical complications (Comment) (orthostatic hypotension) Patient left: in chair;with call bell/phone within reach;with chair alarm set Nurse Communication: Mobility status;Other (comment) (Bp findings) PT Visit Diagnosis: Unsteadiness on feet (R26.81);Other abnormalities of gait and mobility (R26.89);Muscle weakness (generalized) (M62.81)    Time: 1610-9604 PT Time Calculation (min) (ACUTE ONLY): 38 min   Charges:   PT Evaluation $PT Eval Moderate Complexity: 1 Mod PT Treatments $Therapeutic Activity: 38-52 mins        Johnny Bridge, PT Acute Rehab   Jacqualyn Posey 01/15/2023, 4:11 PM
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
## 2023-01-15 NOTE — Progress Notes (Signed)
   ORTHOPAEDIC PROGRESS NOTE  s/p Procedure(s): OPEN REDUCTION OF CHRONIC SHOULDER DISLOCATION, OPEN REDUCTION INTERNAL FIXATION OF TUBULAR FRACTURE, LEFT SHOULDER HEMIPLASTY, LATERJET AUGMENTATION  SUBJECTIVE: Reports moderate pain about operative site - about the same as yesterday. He feels better than he did with his shoulder dislocated. Still has numbness in his arm which was noted preoperatively.   No chest pain. No SOB. No nausea/vomiting. No other complaints.  OBJECTIVE: PE: General:resting in hospital bed, NAD Cardiac: regular rate Pulmonary: no increased work of breathing LUE: Dressing CDI and sling well fitting,  full and painless ROM throughout hand with DPC of 0.  He is able to perform distal motor function in AIN, PIN distributions. Difficulty with ulnar nerve motor function. He reports paresthesias in the left hand - worse ulnar nerve distribution. He continues to have numbness in axillary nerve distribution.    Vitals:   01/15/23 0437 01/15/23 0742  BP: (!) 97/56   Pulse: 70   Resp: 18   Temp: (!) 97.5 F (36.4 C)   SpO2: 98% 99%   Stable post-op images.   ASSESSMENT: [name] is a 67 y.o. male POD#2  PLAN: Weightbearing: NWB LUE Insicional and dressing care: Reinforce dressings as needed Orthopedic device(s):  Sling Showering: hold for now VTE prophylaxis: Resume Plavix. Ambulation. SCDs. Pain control: PRN pain medications, minimize narcotics as able Follow - up plan: 1 week in office Dispo: TBD. Okay to discharge once cleared by medicine and therapy teams. Discharge medications sent to patient's local pharmacy.   Contact information:  Weekdays 8-5 Dr. Ramond Marrow, Alfonse Alpers PA-C , After hours and holidays please check [email or web address] for group call information for [facility]  Alfonse Alpers, PA-C 01/15/2023

## 2023-01-15 NOTE — Progress Notes (Signed)
Orthopedic Tech Progress Note Patient Details:  [name] [birth year] [id number]  Ortho Devices Type of Ortho Device: Sling immobilizer Ortho Device/Splint Interventions: Ordered, Adjustment     New sling requested by 3W staff, patient requested to not have sling on at this time because his arm is comfotable in the position that it is in with the iceman on top. Sling adjusted and left at bedside. Darleen Crocker 01/15/2023, 10:06 AM

## 2023-01-15 NOTE — Progress Notes (Addendum)
Patient Name: [name] Date of Encounter: 01/15/2023 [facility] Cardiologist: Nona Dell, MD   Interval Summary  .   67 y.o. male with a hx of CAD s/p DES to LAD 03/09/2013, PAD s/p L SFA stenting 06/2021, carotid artery disease s/p R CEA, HTN, HLD, DM II, COPD on 6 LNC 24/7, tobacco abuse, h/o CVA, renal cell carcinoma s/p R nephrectomy and remote history of substance use in remission , who is admitted for left humeral displaced fracture, cardiology was consulted on 01/13/23 for pre-op clearance for reverse shoulder arthroplasty. He was deemed high risk due to poor functional status and advanced lung disease with other comorbidities, had angina 2 weeks ago with walking which was unusual level of exertion for him, had no angina before or after surgery and Echo 6/26 with LVEF 55-60%, grade I DD, normal RV, WMA not ideally assessed . He is followed by cardiology post-operatively, doing well so far from CV standpoint.    He reports having more pain of his shoulder today, also feels increasingly SOB while resting in bed. He wants more pain meds. He denied any chest pain.   Vital Signs .    Vitals:   01/14/23 1948 01/14/23 2107 01/15/23 0437 01/15/23 0742  BP:  117/76 (!) 97/56   Pulse:  68 70   Resp:  18 18   Temp:  97.6 F (36.4 C) (!) 97.5 F (36.4 C)   TempSrc:  Oral    SpO2: 94% 97% 98% 99%  Weight:      Height:        Intake/Output Summary (Last 24 hours) at 01/15/2023 0958 Last data filed at 01/15/2023 0909 Gross per 24 hour  Intake 840 ml  Output 600 ml  Net 240 ml      01/12/2023   12:15 PM 12/22/2022   12:28 PM 12/10/2022    1:42 PM  Last 3 Weights  Weight (lbs) 205 lb 14.6 oz 222 lb 6.4 oz 222 lb 6.4 oz  Weight (kg) 93.4 kg 100.88 kg 100.88 kg      Telemetry/ECG      EKG from 6/27 not done, re-ordered today  Echo 01/13/23 EF 55-60%, mild concentric LVH G1DD  RV normal, LA mildly dilated, trivial MR, + aortic sclerosis no stenosis.   Physical  Exam .    GEN: No acute distress.   Neck: No JVD Cardiac: RRR, no murmurs, rubs, or gallops.  Respiratory: Clear to auscultation bilaterally.On [place] oxygen. Speaks full sentence.  GI: Soft, nontender, non-distended  MS: No edema lower ext, should arm in sling  Assessment & Plan .     CAD with hx of DES to LAD 2014 and abnormal EKG  - no chest pain POD 2 reverse shoulder arthoplasty, pre-op Echo as above  - Plavix resumed 6/27 per ortho surgery clearance, continue PTA lipitor, Imdur, ranexa, diltiazem,  not on BB PTA due to lung disease  - Follow up arranged with cardiology 7/3, consider ischemic evaluation if angina remains (had reported angina with walking 2 weeks ago)  Chronic diastolic heart failure -  euvolemic on exam today, feels more SOB at rest with increased pain this AM - monitor respiratory exam , repeat CXR if needed  - BP low normal, tolerating PO, labs with new AKI, agree with IVF trial and holding PTA torsemide   PAD with hx LSFA stent Carotid disease with hx R CEA --stable, continue outpatient surveillance   Displaced left humeral fracture  HTN HLD DM2  COPD on home oxygen tobacco use Hx CVA Hx of RCC s/p R nephrectomy - per primary team     For questions or updates, please contact [facility] Please consult [email or web address] for contact info under        Signed, Cyndi Bender, NP   I have seen and examined the patient along with Cyndi Bender, NP .  I have reviewed the chart, notes and new data.  I agree with PA/NP's note.   PLAN: [facility] will sign off.   Medication Recommendations:  resume previous cardiac meds Other recommendations (labs, testing, etc):  consider outpatient evaluation for asymptomatic ECG changes w nuclear perfusion study (avoid contrast unless really necessary due to CKD) Follow up as an outpatient:  01/20/23 w Thyra Breed, MD, [facility] [phone] 01/15/2023, 10:28 AM

## 2023-01-15 NOTE — Care Management Important Message (Signed)
Important Message  Patient Details IM Letter given. Name: [name] MRN: [id number] Date of Birth: 11/21/1955   Medicare Important Message Given:  Yes     Caren Macadam 01/15/2023, 3:13 PM

## 2023-01-15 NOTE — Plan of Care (Signed)
  Problem: Activity: Goal: Risk for activity intolerance will decrease Outcome: Progressing   Problem: Nutrition: Goal: Adequate nutrition will be maintained Outcome: Progressing   Problem: Pain Managment: Goal: General experience of comfort will improve Outcome: Progressing   

## 2023-01-16 ENCOUNTER — Inpatient Hospital Stay (HOSPITAL_COMMUNITY): Payer: 59

## 2023-01-16 DIAGNOSIS — N179 Acute kidney failure, unspecified: Secondary | ICD-10-CM | POA: Diagnosis not present

## 2023-01-16 LAB — CBC
HCT: 38.8 % — ABNORMAL LOW (ref 39.0–52.0)
Hemoglobin: 12.2 g/dL — ABNORMAL LOW (ref 13.0–17.0)
MCH: 26.9 pg (ref 26.0–34.0)
MCHC: 31.4 g/dL (ref 30.0–36.0)
MCV: 85.5 fL (ref 80.0–100.0)
Platelets: 182 10*3/uL (ref 150–400)
RBC: 4.54 MIL/uL (ref 4.22–5.81)
RDW: 13.8 % (ref 11.5–15.5)
WBC: 9.1 10*3/uL (ref 4.0–10.5)
nRBC: 0 % (ref 0.0–0.2)

## 2023-01-16 LAB — BASIC METABOLIC PANEL
Anion gap: 11 (ref 5–15)
BUN: 39 mg/dL — ABNORMAL HIGH (ref 8–23)
CO2: 22 mmol/L (ref 22–32)
Calcium: 8 mg/dL — ABNORMAL LOW (ref 8.9–10.3)
Chloride: 100 mmol/L (ref 98–111)
Creatinine, Ser: 2.25 mg/dL — ABNORMAL HIGH (ref 0.61–1.24)
GFR, Estimated: 31 mL/min — ABNORMAL LOW (ref >60)
Glucose, Bld: 121 mg/dL — ABNORMAL HIGH (ref 70–99)
Potassium: 4.2 mmol/L (ref 3.5–5.1)
Sodium: 133 mmol/L — ABNORMAL LOW (ref 135–145)

## 2023-01-16 LAB — URINALYSIS, ROUTINE W REFLEX MICROSCOPIC
Bilirubin Urine: NEGATIVE
Glucose, UA: NEGATIVE mg/dL
Hgb urine dipstick: NEGATIVE
Ketones, ur: NEGATIVE mg/dL
Leukocytes,Ua: NEGATIVE
Nitrite: NEGATIVE
Protein, ur: NEGATIVE mg/dL
Specific Gravity, Urine: 1.008 (ref 1.005–1.030)
pH: 6 (ref 5.0–8.0)

## 2023-01-16 LAB — GLUCOSE, CAPILLARY
Glucose-Capillary: 119 mg/dL — ABNORMAL HIGH (ref 70–99)
Glucose-Capillary: 194 mg/dL — ABNORMAL HIGH (ref 70–99)
Glucose-Capillary: 156 mg/dL — ABNORMAL HIGH (ref 70–99)
Glucose-Capillary: 76 mg/dL (ref 70–99)
Glucose-Capillary: 97 mg/dL (ref 70–99)

## 2023-01-16 LAB — SODIUM, URINE, RANDOM: Sodium, Ur: <10 mmol/L

## 2023-01-16 LAB — CREATININE, URINE, RANDOM: Creatinine, Urine: 38 mg/dL

## 2023-01-16 MED ORDER — OXYCODONE HCL 5 MG PO TABS: 5.0000 mg | ORAL_TABLET | ORAL | 0 refills | Status: DC | PRN

## 2023-01-16 MED ORDER — MIDODRINE HCL 2.5 MG PO TABS
2.5000 mg | ORAL_TABLET | Freq: Two times a day (BID) | ORAL | Status: DC
  Administered 2023-01-16 – 2023-01-19 (×2): 2.5 mg via ORAL
  Filled 2023-01-16 (×7): qty 1

## 2023-01-16 MED ORDER — DILTIAZEM HCL ER COATED BEADS 120 MG PO CP24
120.0000 mg | ORAL_CAPSULE | Freq: Every day | ORAL | Status: DC
  Administered 2023-01-16: 120 mg via ORAL
  Filled 2023-01-16: qty 1

## 2023-01-16 MED ORDER — DILTIAZEM HCL 60 MG PO TABS
60.0000 mg | ORAL_TABLET | Freq: Two times a day (BID) | ORAL | Status: DC
  Administered 2023-01-16 – 2023-01-19 (×6): 60 mg via ORAL
  Filled 2023-01-16 (×7): qty 1

## 2023-01-16 MED ORDER — MIDODRINE HCL 2.5 MG PO TABS: 2.5000 mg | ORAL_TABLET | Freq: Two times a day (BID) | ORAL | Status: DC

## 2023-01-16 MED ORDER — SODIUM CHLORIDE 0.9 % IV SOLN: INTRAVENOUS | Status: AC

## 2023-01-16 NOTE — Progress Notes (Signed)
   ORTHOPAEDIC PROGRESS NOTE  s/p Procedure(s): OPEN REDUCTION OF CHRONIC SHOULDER DISLOCATION, OPEN REDUCTION INTERNAL FIXATION OF TUBULAR FRACTURE, LEFT SHOULDER HEMIPLASTY, LATERJET AUGMENTATION  3 Days Post-Op  SUBJECTIVE: Reports moderate pain about operative site - about the same as yesterday. He feels better than he did with his shoulder dislocated. Still has numbness in his arm which was noted preoperatively.   No chest pain. No SOB. No nausea/vomiting. Mild lightheadedness with standing yesterday.  On O2 at home.  No other complaints.  OBJECTIVE: PE: General:resting in hospital bed, NAD Cardiac: regular rate Pulmonary: no increased work of breathing LUE: Dressing CDI and sling well fitting,  full and painless ROM throughout hand with DPC of 0.  He is able to perform distal motor function in AIN, PIN distributions. Difficulty with ulnar nerve motor function. He reports paresthesias in the left hand - worse ulnar nerve distribution. He continues to have numbness in axillary nerve distribution.    Vitals:   01/15/23 1929 01/16/23 0554  BP: (!) 108/58 133/60  Pulse: 75 80  Resp: 18 19  Temp: 98 F (36.7 C) 98.4 F (36.9 C)  SpO2: 91% 97%   Stable post-op images.   ASSESSMENT: [name] is a 67 y.o. male POD#2  PLAN: Weightbearing: NWB LUE Insicional and dressing care: Reinforce dressings as needed Orthopedic device(s):  Sling Showering: hold for now VTE prophylaxis: Resume Plavix. Ambulation. SCDs. Pain control: PRN pain medications, minimize narcotics as able Follow - up plan: 1 week in office Dispo: TBD. Okay to discharge once cleared by medicine and therapy teams. Possible DC to SNF, defer to medicine/PT recommendations.  Discharge medications sent to patient's local pharmacy, printed Oxycodone script.   Contact information:  Weekdays 8-5 Dr. Ramond Marrow, Alfonse Alpers PA-C , After hours and holidays please check [email or web address] for group call information for  [facility]  Kathie Dike 01/16/23

## 2023-01-16 NOTE — Progress Notes (Signed)
PT Cancellation Note  Patient Details Name: [name] MRN: [id number] DOB: 03/08/1956   Cancelled Treatment:     PT attempted x 3 but deferred at pt request 2* fatigue.  Pt did work with OT earlier this date.  Will follow.Mauro Kaufmann PT Acute Rehabilitation Services Pager [phone] Office [phone]    [facility] 01/16/2023, 3:57 PM
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
## 2023-01-16 NOTE — Progress Notes (Signed)
Patient denies discomfort or pain; but has not voided since earlier in the day. Bladder scan performed revealing 700cc of urine in bladder; In and Out cath performed; patient tolerated procedure well; clean catch urine obtained. 600cc of urine in foley bag before catheter inadvertently came out; patient continued to urinate after catheter out for another 700 cc of clear yellow urine in urinal.
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
## 2023-01-16 NOTE — Progress Notes (Signed)
Occupational Therapy Treatment Patient Details Name: [name] MRN: [id number] DOB: 1955-09-05 Today's Date: 01/16/2023   History of present illness 67 yo male presents to therapy s/p surgical repair of L humeral fx with pt undergoing L TSA, L greater tuberosity ORIF, open reduction of L shoulder, and augmentation of L shoulder anterior gelnoid with autograft bone on 01/13/2023. Pt sustained fall on 12/27/2022 and sustained irreducible L shoulder dislocation. Pt is currently in sling at all times, L UE NWB and no L shoulder ROM. Pt had recent hospital admission 6/4-6/6 due to diarrhea and progressive weakness with dx of gastroenteritis. 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   OT comments  Pt making progress with functional goals. Pt initially lethargic stating that he didn't sleep well last night but able to participate after sitting EOB with increased alertness. Pt unsteady throughout ADL mobility. Pt required verbal and physical cues for safety during walk back to bed from chair; pt attempting to sit before reaching EOB from recliner. Reviewed NWB restrictions, sling wear, L UE positioning and compensatory ADL techniques. OT will continue to follow acutely to maximize level of function and safety   Recommendations for follow up therapy are one component of a multi-disciplinary discharge planning process, led by the attending physician.  Recommendations may be updated based on patient status, additional functional criteria and insurance authorization.    Assistance Recommended at Discharge Intermittent Supervision/Assistance  Patient can return home with the following  A little  help with walking and/or transfers;Assistance with cooking/housework;Assist for transportation;Help with stairs or ramp for entrance;A little help with bathing/dressing/bathroom   Equipment Recommendations  None recommended by OT    Recommendations for Other Services      Precautions / Restrictions Precautions Precautions: Fall;Shoulder Type of Shoulder Precautions: no L shoulder ROM, elbow to hand only Shoulder Interventions: Shoulder sling/immobilizer;Off for dressing/bathing/exercises Precaution Comments: pt able to recall shoulder precautions, NWB Required Braces or Orthoses: Sling Restrictions Weight Bearing Restrictions: Yes LUE Weight Bearing: Non weight bearing       Mobility Bed Mobility Overal bed mobility: Modified Independent Bed Mobility: Supine to Sit, Sit to Supine     Supine to sit: Supervision, HOB elevated Sit to supine: Supervision   General bed mobility comments: toward R side of bed, increased time and effort, no physical assist    Transfers Overall transfer level: Needs assistance Equipment used: None, Quad cane Transfers: Sit to/from Stand Sit to Stand: Min guard     Step pivot transfers: Min guard     General transfer comment: use of quad cane R UE, pt attempting to sit before reacing EOB from recliner     Balance Overall balance assessment: Needs assistance Sitting-balance support: Feet supported, No upper extremity supported Sitting balance-Leahy Scale: Fair Sitting balance - Comments: seated at EOB, difficulty maintaining LLE on floor and posteior lean   Standing balance support: During functional activity, Single extremity supported, Reliant on assistive device for balance Standing balance-Leahy Scale: Poor                             ADL either performed or assessed with clinical judgement   ADL  Overall ADL's : Needs assistance/impaired     Grooming: Wash/dry hands;Wash/dry face;Min guard;Standing   Upper Body  Bathing: Minimal assistance;Sitting Upper Body Bathing Details (indicate cue type and reason): simulated     Upper Body Dressing : Minimal assistance;Sitting       Toilet Transfer: Min guard;Ambulation;BSC/3in1 Statistician Details (indicate cue type and reason): simulated to chair Toileting- Clothing Manipulation and Hygiene: Min guard;Sitting/lateral lean       Functional mobility during ADLs: Min guard General ADL Comments: reviewed compensatory strategies for ADLs, positioning L UE in bed and chair, sling use, NWB status    Extremity/Trunk Assessment Upper Extremity Assessment Upper Extremity Assessment: LUE deficits/detail LUE Deficits / Details: sling   Lower Extremity Assessment Lower Extremity Assessment: Defer to PT evaluation   Cervical / Trunk Assessment Cervical / Trunk Assessment: Normal    Vision Baseline Vision/History: 1 Wears glasses Ability to See in Adequate Light: 0 Adequate Patient Visual Report: No change from baseline     Perception     Praxis      Cognition Arousal/Alertness: Lethargic, Suspect due to medications, Awake/alert Behavior During Therapy: WFL for tasks assessed/performed Overall Cognitive Status: Within Functional Limits for tasks assessed                                 General Comments: initially lethargic        Exercises      Shoulder Instructions       General Comments      Pertinent Vitals/ Pain       Pain Assessment Pain Assessment: 0-10 Pain Score: 4  Pain Location: L UE Pain Descriptors / Indicators: Aching, Discomfort, Sore Pain Intervention(s): Monitored during session, Repositioned  Home Living                                          Prior Functioning/Environment              Frequency  Min 1X/week        Progress Toward Goals  OT Goals(current goals can now be found in the care plan section)  Progress towards OT goals: Progressing toward goals      Plan Discharge plan remains appropriate    Co-evaluation                 AM-PAC OT "6 Clicks" Daily Activity     Outcome Measure   Help from another person eating meals?: None Help from another person taking care of personal grooming?: A Little Help from another person toileting, which includes using toliet, bedpan, or urinal?: A Little Help from another person bathing (including washing, rinsing, drying)?: A Little Help from another person to put on and taking off regular upper body clothing?: A Little Help from another person to put on and taking off regular lower body clothing?: A Little 6 Click Score: 19    End of Session Equipment Utilized During Treatment: Gait belt;Oxygen;Other (comment) (cane)  OT Visit Diagnosis: Unsteadiness on feet (R26.81);Other abnormalities of gait and mobility (R26.89);Muscle weakness (generalized) (M62.81)   Activity Tolerance Patient tolerated treatment well;Patient limited by lethargy   Patient Left in bed;with call bell/phone within reach;with bed alarm set   Nurse Communication          Time: 8119-1478 OT Time Calculation (min): 24 min  Charges:  OT General Charges $OT Visit: 1 Visit OT Treatments $Self Care/Home Management : 8-22 mins $Therapeutic Activity: 8-22 mins   Galen Manila 01/16/2023, 1:06 PM

## 2023-01-16 NOTE — Consult Note (Addendum)
Renal Service Consult Note [name]  [name] 01/16/2023 Kevin Krabbe, MD Requesting Physician: Dr. Waymon Amato  Reason for Consult: Renal failure HPI: The patient is a 67 y.o. year-old w/ PMH of CAD sp PCI, PAD, HTN, HL, DM2, COPD, on home O2, CVA, sp R nephrectomy who was admitted to ortho service for elective surgery of L humeral fracture which he sustained on 6/09. Pt underwent L shoulder surgery on 6/26. Post-op creatinine rose to 2.1 yesterday and 2.25 today (b/l creat 1.1 on admission).  We are asked to see for renal failure.   IP meds: lipitor, plavix, cardizem cd, prozac, neurontin, dilaudid, insulin, imdur, protonix, kdur, lyrica, ranexa, flomax, torsemide 60 bid (dc'd yest), IV ancef, LR 50cc/hr, NS 50 cc/hr, norco prn, IV metoprolol prn, zofran, vanc powder.   Pt seen in room. He knows all his BP lowering home meds and has been taking all of them, right up to his admit date.  Also taking the midodrine 2.5 bid. He says his BP is labile and can "drop off" out of nowhere and he usually just goes to sleep and when he wakes up the BP is better. Currently denies any LE edema or SOB. He is on 6L O2 at home. Main c/o is that he is very thirsty here and can't get enough to drink.   On 6/28 BP's dropped into the 70s- 90s for about . He rec'd NS bolus 250 and IVF's at 50 cc/hr and his torsemide 60mg  bid was stopped.    ROS - denies CP, no joint pain, no HA, no blurry vision, no rash, no diarrhea, no nausea/ vomiting, no dysuria, no difficulty voiding   Past Medical History  Past Medical History:  Diagnosis Date   Anxiety    Arthritis    Cholecystitis, acute 12/20/2013   Lap chole 6/5   Cocaine abuse ([name])    Community acquired pneumonia 05/31/2018   COPD (chronic obstructive pulmonary disease) ([name])    Coronary atherosclerosis of native coronary artery    a. 03/09/2013 Cath/PCI: LM nl, LAD: 50p, 84m (2.5x16 promus DES), LCX nl, OM1 min irregs, LPL/LPDA diff  dzs, RCA nondom, mod diff dzs, EF 55%.   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 ([name]) 04/24/2021   History of nephrolithiasis    History of pneumonia    History of stroke    Right MCA distribution, residual left-sided weakness   Hyperlipidemia    Metabolic encephalopathy    Noncompliance    Osteomyelitis of second toe of left foot ([name]) 04/24/2021   Renal cell carcinoma ([name])    Status post radical right nephrectomy August 2015   Respiratory failure with hypoxia ([name])    Sepsis ([name]) 05/19/2019   Sleep apnea    On CPAP, 4L O2 no cpap at home yet   Type 2 diabetes mellitus ([name]) 2011   Past Surgical History  Past Surgical History:  Procedure Laterality Date   ABDOMINAL AORTOGRAM W/LOWER EXTREMITY Left 07/17/2021   Procedure: ABDOMINAL AORTOGRAM W/LOWER EXTREMITY;  Surgeon: Nada Libman, MD;  Location: [facility];  Service: Cardiovascular;  Laterality: Left;   AMPUTATION Left 12/27/2020   Procedure: PARTIAL FIRST RAY AMPUTATION OF FOOT;  Surgeon: Candelaria Stagers, DPM;  Location: [facility] OR;  Service: Podiatry;  Laterality: Left;   APPENDECTOMY  1970's  CHOLECYSTECTOMY N/A 12/22/2013   Procedure: LAPAROSCOPIC CHOLECYSTECTOMY ;  Surgeon: Shelly Rubenstein, MD;  Location: [facility] OR;  Service: General;  Laterality: N/A;   ENDARTERECTOMY Right 10/08/2014   Procedure: Right ENDARTERECTOMY CAROTID;  Surgeon: Larina Earthly, MD;  Location: [facility] OR;  Service: Vascular;  Laterality: Right;   LAPAROSCOPIC LYSIS OF ADHESIONS  02/21/2014   Procedure: LAPAROSCOPIC LYSIS OF ADHESIONS EXTINSIVE;  Surgeon: Sebastian Ache, MD;  Location: [facility] ORS;  Service: Urology;;   LEFT HEART CATHETERIZATION WITH CORONARY ANGIOGRAM N/A 06/02/2012   Procedure: LEFT HEART CATHETERIZATION WITH CORONARY ANGIOGRAM;  Surgeon: Herby Abraham, MD;  Location: [name] CATH LAB;  Service:  Cardiovascular;  Laterality: N/A;   LEFT HEART CATHETERIZATION WITH CORONARY ANGIOGRAM N/A 03/09/2013   Procedure: LEFT HEART CATHETERIZATION WITH CORONARY ANGIOGRAM;  Surgeon: Tonny Bollman, MD;  Location: [facility] CATH LAB;  Service: Cardiovascular;  Laterality: N/A;   LEFT HEART CATHETERIZATION WITH CORONARY ANGIOGRAM N/A 12/20/2013   Procedure: LEFT HEART CATHETERIZATION WITH CORONARY ANGIOGRAM;  Surgeon: Kathleene Hazel, MD;  Location: [name] CATH LAB;  Service: Cardiovascular;  Laterality: N/A;   LUNG BIOPSY Left 05/18/2014   Procedure: LUNG BIOPSY left upper lobe & left lower lobe;  Surgeon: Loreli Slot, MD;  Location: [name] OR;  Service: Thoracic;  Laterality: Left;   PATCH ANGIOPLASTY Right 10/08/2014   Procedure: PATCH ANGIOPLASTY Right Carotid;  Surgeon: Larina Earthly, MD;  Location: [name] OR;  Service: Vascular;  Laterality: Right;   PERIPHERAL VASCULAR INTERVENTION Left 07/17/2021   Procedure: PERIPHERAL VASCULAR INTERVENTION;  Surgeon: Nada Libman, MD;  Location: [facility];  Service: Cardiovascular;  Laterality: Left;  Superficial Femoral Artery   REVERSE SHOULDER ARTHROPLASTY Left 01/13/2023   Procedure: OPEN REDUCTION OF CHRONIC SHOULDER DISLOCATION, OPEN REDUCTION INTERNAL FIXATION OF TUBULAR FRACTURE, LEFT SHOULDER HEMIPLASTY, LATERJET AUGMENTATION;  Surgeon: Bjorn Pippin, MD;  Location: [facility] ORS;  Service: Orthopedics;  Laterality: Left;   ROBOT ASSISTED LAPAROSCOPIC NEPHRECTOMY Right 02/21/2014   Procedure: ROBOTIC ASSISTED LAPAROSCOPIC RIGHT NEPHRECTOMY ;  Surgeon: Sebastian Ache, MD;  Location: [facility];  Service: Urology;  Laterality: Right;   VIDEO ASSISTED THORACOSCOPY Left 05/18/2014   Procedure: LEFT VIDEO ASSISTED THORACOSCOPY;  Surgeon: Loreli Slot, MD;  Location: [facility] OR;  Service: Thoracic;  Laterality: Left;   VIDEO BRONCHOSCOPY  05/18/2014   Procedure: VIDEO BRONCHOSCOPY;  Surgeon: Loreli Slot, MD;  Location: [name] OR;  Service: Thoracic;;    Family History  Family History  Problem Relation Age of Onset   Cancer Mother        Thyroid - living in her 75's.   Hypertension Other    Diabetes Other    Stroke Other    Lung cancer Father    CAD Father    Cancer Maternal Grandmother        Breast   Cancer Maternal Grandfather        Throat and stomach   CAD Brother    Social History  reports that he has been smoking cigarettes. He started smoking about 50 years ago. He has a 41.00 pack-year smoking history. He has never been exposed to tobacco smoke. He has never used smokeless tobacco. He reports that he does not drink alcohol and does not use drugs. Allergies  Allergies  Allergen Reactions   Lisinopril Other (See Comments)    Other reaction(s): worsening kidney function   Pollen Extract     Other reaction(s): sneezing   Oxycodone Itching and Nausea Only  Pt is able to tolerate if taken with benadryl   Home medications Prior to Admission medications   Medication Sig Start Date End Date Taking? Authorizing Provider  acetaminophen (TYLENOL) 500 MG tablet Take 1,000 mg by mouth every 6 (six) hours as needed for moderate pain. 06/03/18  Yes [provider]  acetaminophen (TYLENOL) 500 MG tablet Take 2 tablets (1,000 mg total) by mouth every 8 (eight) hours for 14 days. 01/15/23 01/29/23 Yes McBane, Jerald Kief, PA-C  albuterol (PROVENTIL) (2.5 MG/3ML) 0.083% nebulizer solution Take 3 mLs (2.5 mg total) by nebulization every 6 (six) hours as needed for wheezing or shortness of breath. 09/04/22  Yes Cobb, Ruby Cola, NP  ALPRAZolam Prudy Feeler) 1 MG tablet Take 1 mg by mouth 3 (three) times daily. 01/07/21  Yes [provider]  atorvastatin (LIPITOR) 20 MG tablet Take 20 mg by mouth at bedtime. 04/26/18  Yes [provider]  benzonatate (TESSALON) 200 MG capsule TAKE ONE CAPSULE BY MOUTH THREE TIMES DAILY AS NEEDED FOR COUGH Patient taking differently: Take 200 mg by mouth 3 (three) times daily as needed for  cough. 09/29/22  Yes Kalman Shan, MD  budesonide (PULMICORT) 1 MG/2ML nebulizer solution Take 2 mLs (1 mg total) by nebulization in the morning and at bedtime. Patient taking differently: Take 1 mg by nebulization 2 (two) times daily as needed (Weezing/SOB). 10/06/22  Yes Cobb, Ruby Cola, NP  clopidogrel (PLAVIX) 75 MG tablet TAKE ONE TABLET BY MOUTH EVERY DAY 05/27/22  Yes Jonelle Sidle, MD  diltiazem (CARDIZEM CD) 180 MG 24 hr capsule TAKE ONE CAPSULE BY MOUTH EVERY DAY Patient taking differently: Take 180 mg by mouth daily. 10/21/22  Yes Jonelle Sidle, MD  diphenhydrAMINE (BENADRYL) 25 mg capsule Take 50 mg by mouth at bedtime.   Yes [provider]  esomeprazole (NEXIUM) 40 MG capsule Take 40 mg by mouth daily.   Yes [provider]  FLUoxetine (PROZAC) 40 MG capsule Take 40 mg by mouth daily.   Yes [provider]  gabapentin (NEURONTIN) 300 MG capsule Take 900 mg by mouth 3 (three) times daily.   Yes [provider]  insulin regular human CONCENTRATED (HUMULIN R) 500 UNIT/ML injection Inject 90 Units into the skin 3 (three) times daily.   Yes [provider]  isosorbide mononitrate (IMDUR) 30 MG 24 hr tablet Take 30 mg by mouth every evening.   Yes [provider]  isosorbide mononitrate (IMDUR) 60 MG 24 hr tablet Take 1 tablet (60 mg total) by mouth in the morning. & 30 mg in the evening Patient taking differently: Take 60 mg by mouth in the morning. 03/27/22  Yes Jonelle Sidle, MD  linagliptin (TRADJENTA) 5 MG TABS tablet Take 5 mg by mouth daily.   Yes [provider]  midodrine (PROAMATINE) 2.5 MG tablet Take 1 tablet (2.5 mg total) by mouth 2 (two) times daily. 08/11/22  Yes Jonelle Sidle, MD  ondansetron (ZOFRAN) 4 MG tablet Take 1 tablet (4 mg total) by mouth every 8 (eight) hours as needed for up to 7 days for nausea or vomiting. 01/15/23 01/22/23 Yes McBane, Jerald Kief, PA-C  oxyCODONE (OXY IR/ROXICODONE)  5 MG immediate release tablet Take 1-2 pills every 6 hrs as needed for severe pain, no more than 6 per day 01/15/23 01/20/23 Yes McBane, Jerald Kief, PA-C  oxyCODONE (ROXICODONE) 5 MG immediate release tablet Take 1 tablet (5 mg total) by mouth every 4 (four) hours as needed for severe pain.  01/16/23  Yes Cecil Cobbs, PA-C  oxyCODONE-acetaminophen (PERCOCET) 10-325 MG tablet Take 1 tablet by mouth every 6 (six) hours as needed for pain. 09/25/21  Yes [provider]  potassium chloride SA (KLOR-CON M) 20 MEQ tablet Take 1 tablet (20 mEq total) by mouth 2 (two) times daily. 12/28/22  Yes Johnson, Clanford L, MD  pregabalin (LYRICA) 25 MG capsule Take 25 mg by mouth 2 (two) times daily. 02/14/19  Yes [provider]  ranolazine (RANEXA) 500 MG 12 hr tablet Take 1 tablet (500 mg total) by mouth 2 (two) times daily. 08/27/22  Yes Sharlene Dory, NP  revefenacin (YUPELRI) 175 MCG/3ML nebulizer solution Take 3 mLs (175 mcg total) by nebulization daily. Patient taking differently: Take 175 mcg by nebulization daily as needed (Wheezing/SOB). 09/16/22  Yes Cobb, Ruby Cola, NP  tamsulosin (FLOMAX) 0.4 MG CAPS capsule Take 0.4 mg by mouth daily.    Yes [provider]  torsemide (DEMADEX) 20 MG tablet Take 3 tablets (60 mg total) by mouth 2 (two) times daily. Patient taking differently: Take 60 mg by mouth 3 (three) times daily. 12/28/22  Yes Johnson, Clanford L, MD  traMADol (ULTRAM) 50 MG tablet Take 50 mg by mouth 4 (four) times daily as needed for moderate pain. 01/05/23  Yes [provider]  zolpidem (AMBIEN) 5 MG tablet Take 5 mg by mouth at bedtime. 03/03/19  Yes [provider]  doxycycline (VIBRA-TABS) 100 MG tablet Take 1 tablet (100 mg total) by mouth 2 (two) times daily. Patient not taking: Reported on 01/06/2023 12/09/22   Candelaria Stagers, DPM  formoterol (PERFOROMIST) 20 MCG/2ML nebulizer solution Take 2 mLs (20 mcg total) by nebulization 2 (two) times  daily. Patient not taking: Reported on 01/12/2023 09/16/22   Noemi Chapel, NP     Vitals:   01/15/23 1820 01/15/23 1929 01/16/23 0554 01/16/23 0858  BP:  (!) 108/58 133/60   Pulse:  75 80   Resp:  18 19   Temp:  98 F (36.7 C) 98.4 F (36.9 C)   TempSrc:  Oral    SpO2: 95% 91% 97% 97%  Weight:      Height:       Exam Gen alert, no distress, chron ill appearing adult male No rash, cyanosis or gangrene Sclera anicteric, throat clear  No jvd or bruits Chest clear bilat to bases, no rales/ wheezing RRR no RG Abd soft ntnd no mass or ascites +bs, obese GU normal male MS L arm in a sline Ext no LE or UE edema, no wounds or ulcers Neuro is alert, Ox 3 , nonfocal    Home meds include - albuterol, xanax, lipitor, pulmicort, plavix, cardizem cd 180, benadryl 50 hs, esomeprazole, fluoxetine, formoterol, gabapentin 600 tid, insulin regular 90u tid, imdur 60 am+ 30 pm, linagliptin, midodrine 2.5 bid, percocet prn, klorcon 20 bid, lyrica 25 bid, ranexa, yupelri nebs, flomax, demadex 60 bid, ultram prn, ambien, prns/ vits/ supps     Date   Creat  eGFR   2010-2013  1.00- 1.12   2014    1.08- 1.40   2015   0.83- 1.87   Mar 2016  3.16 >> 1.68 AKI episode   Jul- dec 2016 1.58- 1.93   Jan 2017  1.90 >> 1.55 AKI    2018   1.66- 1.92   Nov 2019  1.85 >> 1.27 AKI   April 2020  1.91 >> 1.14 AKI   Jun - oct 2020 1.32 - 1.80  Jun- sept 2021 1.74- 1.92   Jun 2022  1.81 >> 1.12  AKI   Oct - dec 2022 1.27- 1.89   April 2023  1.62- 1.82   Oct-nov 2023 1.28- 1.73 43- > 60 ml/min   09/25/22  2.05   6/04- 12/27/22  1.06- 2.08   6/25   1.23   6/26   1.18     01/15/23  2.10   01/16/23  2.25    Renal US 6/29 --> Right Kidney is surgically absent. Left Kidney: 13.9 cm, echogenicity wnl, no hydronephrosis.     CXR 6/25 - L pleural thickening / atx, no other active findings    BP's 165/ 80 on 6/25             130/ 85 on 6/26              110/75 on 6/27               97/56 on 6/28  -->  orthostatics 104/65 lying and 75/ 55 standing               133/60 on 6/29     No nsaids/ contrast/ acei or ARB given here  Assessment/ Plan: AKI on CKD 3a - b/l creat 1.28- 1.73 from late 2023, eGFR 43- >60 ml/min. Creat here was 1.23 on admit for elective shoulder surgery then rose up to 2.25 today which is 4 days later. Renal shows solitary L kidney w/o obstruction. UA is pending. Exam is w/o vol overload. Had hypotension episode yesterday morning, and rec'd some IVF"s and diuretics and imdur were stopped. BP's improved today. Suspect AKI related to vol depletion +/- cardizem. Will resume IVF"s at 75 cc/hr, change cardizem to short-acting (60mg  bid for now) w/ hold orders. Will resume his home midodrine 2.5 bid. Check am cortisol. Will follow.  SP L shoulder surgery CAD sp PCI/ chronic angina HTN - holding imdur, lowered cardizem and giving short-acting for now (60mg  bid).  Chronic resp failure - on 6 L [place] at home. Admit CXR w/o edema.     Kevin Moselle  MD [facility] 01/16/2023, 12:56 PM  Recent Labs  Lab 01/12/23 1703 01/13/23 0448 01/15/23 0323 01/16/23 0408  HGB 14.7   < > 12.5* 12.2*  ALBUMIN 3.1*  --   --   --   CALCIUM 8.7*   < > 8.4* 8.0*  CREATININE 1.23   < > 2.10* 2.25*  K 3.3*   < > 4.6 4.2   < > = values in this interval not displayed.   Inpatient medications:  ALPRAZolam  1 mg Oral TID   atorvastatin  20 mg Oral QHS   budesonide (PULMICORT) nebulizer solution  0.5 mg Nebulization BID   clopidogrel  75 mg Oral Daily   diltiazem  120 mg Oral Daily   diphenhydrAMINE  50 mg Oral QHS   docusate sodium  100 mg Oral BID   FLUoxetine  40 mg Oral Daily   gabapentin  300 mg Oral QHS   insulin aspart  0-9 Units Subcutaneous Q4H   insulin regular human CONCENTRATED  45 Units Subcutaneous TID WC   pantoprazole  40 mg Oral Daily   pregabalin  25 mg Oral BID   ranolazine  500 mg Oral BID   revefenacin  175 mcg Nebulization Daily   tamsulosin  0.4 mg Oral Daily    albuterol,  diphenhydrAMINE, HYDROcodone-acetaminophen, HYDROcodone-acetaminophen, magnesium citrate, menthol-cetylpyridinium **OR** phenol, morphine injection, ondansetron **OR** ondansetron (ZOFRAN) IV,  polyethylene glycol

## 2023-01-17 DIAGNOSIS — R338 Other retention of urine: Secondary | ICD-10-CM

## 2023-01-17 DIAGNOSIS — N189 Chronic kidney disease, unspecified: Secondary | ICD-10-CM

## 2023-01-17 DIAGNOSIS — N179 Acute kidney failure, unspecified: Secondary | ICD-10-CM | POA: Diagnosis not present

## 2023-01-17 LAB — BASIC METABOLIC PANEL
Anion gap: 6 (ref 5–15)
BUN: 34 mg/dL — ABNORMAL HIGH (ref 8–23)
CO2: 25 mmol/L (ref 22–32)
Calcium: 8.2 mg/dL — ABNORMAL LOW (ref 8.9–10.3)
Chloride: 107 mmol/L (ref 98–111)
Creatinine, Ser: 1.77 mg/dL — ABNORMAL HIGH (ref 0.61–1.24)
GFR, Estimated: 42 mL/min — ABNORMAL LOW (ref >60)
Glucose, Bld: 149 mg/dL — ABNORMAL HIGH (ref 70–99)
Potassium: 4.4 mmol/L (ref 3.5–5.1)
Sodium: 138 mmol/L (ref 135–145)

## 2023-01-17 LAB — GLUCOSE, CAPILLARY
Glucose-Capillary: 104 mg/dL — ABNORMAL HIGH (ref 70–99)
Glucose-Capillary: 124 mg/dL — ABNORMAL HIGH (ref 70–99)
Glucose-Capillary: 147 mg/dL — ABNORMAL HIGH (ref 70–99)
Glucose-Capillary: 159 mg/dL — ABNORMAL HIGH (ref 70–99)
Glucose-Capillary: 172 mg/dL — ABNORMAL HIGH (ref 70–99)
Glucose-Capillary: 67 mg/dL — ABNORMAL LOW (ref 70–99)

## 2023-01-17 LAB — CORTISOL-AM, BLOOD: Cortisol - AM: 7.8 ug/dL (ref 6.7–22.6)

## 2023-01-17 LAB — TSH: TSH: 0.086 u[IU]/mL — ABNORMAL LOW (ref 0.350–4.500)

## 2023-01-17 LAB — T4, FREE: Free T4: 1.15 ng/dL — ABNORMAL HIGH (ref 0.61–1.12)

## 2023-01-17 MED ORDER — INSULIN ASPART 100 UNIT/ML IJ SOLN
0.0000 [IU] | Freq: Three times a day (TID) | INTRAMUSCULAR | Status: DC
  Administered 2023-01-18: 2 [IU] via SUBCUTANEOUS
  Administered 2023-01-18: 9 [IU] via SUBCUTANEOUS
  Administered 2023-01-18: 2 [IU] via SUBCUTANEOUS
  Administered 2023-01-19: 3 [IU] via SUBCUTANEOUS
  Administered 2023-01-19: 2 [IU] via SUBCUTANEOUS

## 2023-01-17 MED ORDER — SODIUM CHLORIDE 0.9 % IV SOLN: INTRAVENOUS | Status: AC
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
## 2023-01-17 NOTE — Progress Notes (Signed)
Hypoglycemic Event  CBG: 67  Treatment: 8 oz juice/soda  Symptoms: None  Follow-up CBG: Time:17:10 CBG Result:73  Comments/MD notified:Dr. Hongalgi made aware.  18:00 Pt now eating dinner.    Waldon Merl
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
## 2023-01-17 NOTE — Progress Notes (Addendum)
[facility] Progress Note  Subjective: seen in room. BP's much better and creat down to 1.7  Vitals:   01/17/23 0415 01/17/23 0500 01/17/23 0748 01/17/23 0856  BP: (!) 153/69   (!) 143/72  Pulse: 80   85  Resp: 20   17  Temp: 98.2 F (36.8 C)   98.5 F (36.9 C)  TempSrc: Oral   Oral  SpO2: 95%  91% 96%  Weight:  101.8 kg    Height:        Exam: Gen alert, no distress, chron ill appearing adult male No jvd or bruits Chest clear bilat to bases RRR no RG Abd soft ntnd no mass or ascites +bs, obese GU normal male MS L arm in a sline Ext no LE edema Neuro is alert, Ox 3 , nonfocal      Home meds include - albuterol, xanax, lipitor, pulmicort, plavix, cardizem cd 180, benadryl 50 hs, esomeprazole, fluoxetine, formoterol, gabapentin 600 tid, insulin regular 90u tid, imdur 60 am+ 30 pm, linagliptin, midodrine 2.5 bid, percocet prn, klorcon 20 bid, lyrica 25 bid, ranexa, yupelri nebs, flomax, demadex 60 bid, ultram prn, ambien, prns/ vits/ supps      Date                          Creat               eGFR   2010-2013                 1.00- 1.12   2014                          1.08- 1.40   2015                          0.83- 1.87   Mar 2016                   3.16 >> 1.68    AKI episode   Jul- dec 2016            1.58- 1.93   Jan 2017                   1.90 >> 1.55    AKI    2018                          1.66- 1.92   Nov 2019                   1.85 >> 1.27    AKI   April 2020                  1.91 >> 1.14    AKI   Jun - oct 2020           1.32 - 1.80   Jun- sept 2021          1.74- 1.92   Jun 2022                   1.81 >> 1.12    AKI   Oct - dec 2022          1.27- 1.89   April 2023                  1.62- 1.82  Oct-nov 2023            1.28- 1.73        43- > 60 ml/min   09/25/22                        2.05   6/04- 12/27/22             1.06- 2.08   6/25                           1.23   6/26                           1.18                    01/15/23                       2.10   01/16/23                      2.25     Renal US 6/29 --> Right Kidney is surgically absent. Left Kidney: 13.9 cm, echogenicity wnl, no hydronephrosis.     CXR 6/25 - L pleural thickening / atx, no other active findings    BP's 165/ 80 on 6/25             130/ 85 on 6/26              110/75 on 6/27               97/56 on 6/28  --> orthostatics 104/65 lying and 75/ 55 standing               133/60 on 6/29     No nsaids/ contrast/ acei or ARB given here   UA negative   UNa, < 10, UCr 38   Assessment/ Plan: AKI on CKD 3a - b/l creat 1.28- 1.73 from late 2023, eGFR 43- >60 ml/min. Creat here was 1.23 on admit for elective shoulder surgery then rose up to 2.25 after drop in BP's. Renal shows solitary L kidney w/o obstruction. UA was negative. UNa low. Exam was euvolemic possibly dry. BP's had gradually dropped into 90's the after the surgery likely combination of diuretics/ vol depletion and BP lowering meds. BP lowering meds were reduced and diuretic was dc'd. His home midodrine was resumed 2.5 bid and IVF's were started. BP's came up and creat is down to 1.7 today which is in his usual range. Would cont to hold home torsemide until there are signs of vol overload or 4-7 days from now. Will lower IVF"s to 50 cc/hr. He should continue to recover. No further suggestions. Call w/ any questions. Will sign off.  SP L shoulder surgery CAD sp PCI/ chronic angina HTN - holding imdur, lowered cardizem and giving short-acting for now (60mg  bid). Defer to pmd resuming usual doses.  Chronic resp failure - on 6 L [place] at home. Admit CXR w/o edema.      Kevin Moselle MD  [facility] 01/17/2023, 11:06 AM  Recent Labs  Lab 01/12/23 1703 01/13/23 0448 01/15/23 0323 01/16/23 0408 01/17/23 0326  HGB 14.7   < > 12.5* 12.2*  --   ALBUMIN 3.1*  --   --   --   --   CALCIUM 8.7*   < > 8.4*  8.0* 8.2*  CREATININE 1.23   < > 2.10* 2.25* 1.77*  K 3.3*   < > 4.6 4.2 4.4   < > = values in this interval not  displayed.   No results for input(s): "IRON", "TIBC", "FERRITIN" in the last 168 hours. Inpatient medications:  ALPRAZolam  1 mg Oral TID   atorvastatin  20 mg Oral QHS   budesonide (PULMICORT) nebulizer solution  0.5 mg Nebulization BID   clopidogrel  75 mg Oral Daily   diltiazem  60 mg Oral Q12H   diphenhydrAMINE  50 mg Oral QHS   docusate sodium  100 mg Oral BID   FLUoxetine  40 mg Oral Daily   gabapentin  300 mg Oral QHS   insulin aspart  0-9 Units Subcutaneous Q4H   insulin regular human CONCENTRATED  45 Units Subcutaneous TID WC   midodrine  2.5 mg Oral BID WC   pantoprazole  40 mg Oral Daily   pregabalin  25 mg Oral BID   ranolazine  500 mg Oral BID   revefenacin  175 mcg Nebulization Daily   tamsulosin  0.4 mg Oral Daily    sodium chloride 75 mL/hr at 01/17/23 0956   albuterol, diphenhydrAMINE, HYDROcodone-acetaminophen, HYDROcodone-acetaminophen, magnesium citrate, menthol-cetylpyridinium **OR** phenol, morphine injection, ondansetron **OR** ondansetron (ZOFRAN) IV, polyethylene glycol

## 2023-01-17 NOTE — Plan of Care (Signed)

## 2023-01-18 DIAGNOSIS — I10 Essential (primary) hypertension: Secondary | ICD-10-CM | POA: Diagnosis not present

## 2023-01-18 DIAGNOSIS — I2510 Atherosclerotic heart disease of native coronary artery without angina pectoris: Secondary | ICD-10-CM | POA: Diagnosis not present

## 2023-01-18 DIAGNOSIS — E119 Type 2 diabetes mellitus without complications: Secondary | ICD-10-CM | POA: Diagnosis not present

## 2023-01-18 DIAGNOSIS — S42402D Unspecified fracture of lower end of left humerus, subsequent encounter for fracture with routine healing: Secondary | ICD-10-CM | POA: Diagnosis not present

## 2023-01-18 LAB — GLUCOSE, CAPILLARY
Glucose-Capillary: 151 mg/dL — ABNORMAL HIGH (ref 70–99)
Glucose-Capillary: 382 mg/dL — ABNORMAL HIGH (ref 70–99)
Glucose-Capillary: 173 mg/dL — ABNORMAL HIGH (ref 70–99)
Glucose-Capillary: 113 mg/dL — ABNORMAL HIGH (ref 70–99)

## 2023-01-18 LAB — T3, FREE: T3, Free: 2.2 pg/mL (ref 2.0–4.4)

## 2023-01-18 LAB — BASIC METABOLIC PANEL
Anion gap: 8 (ref 5–15)
BUN: 25 mg/dL — ABNORMAL HIGH (ref 8–23)
CO2: 26 mmol/L (ref 22–32)
Calcium: 8.8 mg/dL — ABNORMAL LOW (ref 8.9–10.3)
Chloride: 104 mmol/L (ref 98–111)
Creatinine, Ser: 1.21 mg/dL (ref 0.61–1.24)
GFR, Estimated: >60 mL/min (ref >60)
Glucose, Bld: 153 mg/dL — ABNORMAL HIGH (ref 70–99)
Potassium: 5.1 mmol/L (ref 3.5–5.1)
Sodium: 138 mmol/L (ref 135–145)

## 2023-01-18 NOTE — TOC Progression Note (Signed)
Transition of Care [facility]) - Progression Note    Patient Details  Name: [name] MRN: [id number] Date of Birth: 18-Sep-1955  Transition of Care [facility]) CM/SW Contact  Amada Jupiter, LCSW Phone Number: 01/18/2023, 1:19 PM  Clinical Narrative:     Have begun SNF bed search but continue to await PASRR approval for level 2.  Contacted [place] [facility] today to follow up on report made over there weekend - was informed that case was "not screened in" = no further investigation to occur and they will not be following.    Expected Discharge Plan: OP Rehab Barriers to Discharge: Continued Medical Work up  Expected Discharge Plan and Services In-house Referral: Clinical Social Work     Living arrangements for the past 2 months: Single Family Home                                       Social Determinants of Health (SDOH) Interventions SDOH Screenings   Food Insecurity: No Food Insecurity (01/12/2023)  Housing: Patient Declined (01/12/2023)  Transportation Needs: No Transportation Needs (01/12/2023)  Utilities: Not At Risk (01/12/2023)  Depression (PHQ2-9): Low Risk  (04/24/2022)  Financial Resource Strain: Low Risk  (05/19/2019)  Physical Activity: Inactive (05/19/2019)  Social Connections: Unknown (05/19/2019)  Stress: No Stress Concern Present (05/19/2019)  Tobacco Use: High Risk (01/14/2023)    Readmission Risk Interventions    01/13/2023   10:44 AM  Readmission Risk Prevention Plan  Transportation Screening Complete  HRI or Home Care Consult Complete  Social Work Consult for Recovery Care Planning/Counseling Complete  Palliative Care Screening Not Applicable  Medication Review [employer]) Complete

## 2023-01-18 NOTE — Plan of Care (Signed)
  Problem: Activity: Goal: Risk for activity intolerance will decrease Outcome: Progressing   Problem: Safety: Goal: Ability to remain free from injury will improve Outcome: Progressing   Problem: Pain Management: Goal: Pain level will decrease with appropriate interventions Outcome: Progressing   

## 2023-01-18 NOTE — Progress Notes (Signed)
Physical Therapy Treatment Patient Details Name: [name] MRN: [id number] DOB: 01/26/1956 Today's Date: 01/18/2023   History of Present Illness 67 yo male presents to therapy s/p surgical repair of L humeral fx with pt undergoing L TSA, L greater tuberosity ORIF, open reduction of L shoulder, and augmentation of L shoulder anterior gelnoid with autograft bone on 01/13/2023. Pt sustained fall on 12/27/2022 and sustained irreducible L shoulder dislocation. Pt is currently in sling at all times, L UE NWB and no L shoulder ROM. Pt had recent hospital admission 6/4-6/6 due to diarrhea and progressive weakness with dx of gastroenteritis. 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    PT Comments    Pt is very sleepy this am but agreeable to PT; pt is min assist to min-guard for bed mobility, transfers and taking steps bed to chair; he denies dizziness however is dyspneic on (3L O2) with minimal effort requiring rest breaks during session.  Pt is at risk for falls given balance deficits and deconditioning and will benefit from post acute rehab.   Recommendations for follow up therapy are one component of a multi-disciplinary discharge planning process, led by the attending physician.  Recommendations may be updated based on patient status, additional functional criteria and insurance authorization.  Follow Up Recommendations  Can patient physically be transported by private vehicle: No    Assistance Recommended at Discharge Intermittent Supervision/Assistance  Patient can return home with the following A little help with bathing/dressing/bathroom;Help with stairs or ramp for  entrance;Assistance with cooking/housework;Assist for transportation;A lot of help with walking and/or transfers   Equipment Recommendations  Other (comment) (defer to SNF)    Recommendations for Other Services       Precautions / Restrictions Precautions Precautions: Fall;Shoulder Type of Shoulder Precautions: no L shoulder ROM, elbow to hand only Shoulder Interventions: Shoulder sling/immobilizer;Off for dressing/bathing/exercises Precaution Comments: pt able to recall shoulder precautions, NWB Required Braces or Orthoses: Sling Restrictions Weight Bearing Restrictions: Yes LUE Weight Bearing: Non weight bearing     Mobility  Bed Mobility Overal bed mobility: Needs Assistance Bed Mobility: Supine to Sit     Supine to sit: Supervision, HOB elevated     General bed mobility comments: toward R side of bed, increased time and effort, no physical assist; supervision for safety    Transfers Overall transfer level: Needs assistance Equipment used: None, Quad cane Transfers: Sit to/from Stand Sit to Stand: Min guard, Min assist           General transfer comment: min-guard to min assist to steady, cues for safety, backing up to chair and controlling descent    Ambulation/Gait Ambulation/Gait assistance: Min assist, Min guard Gait Distance (Feet): 5 Feet Assistive device: Quad cane         General Gait Details: steps fwd and back, denies dizziness   Stairs             Wheelchair Mobility    Modified Rankin (Stroke Patients Only)       Balance   Sitting-balance support: Feet supported, No upper extremity supported Sitting balance-Leahy Scale: Fair Sitting balance - Comments: fair +;no LOB static sit, falling asleep EOB; able to scoot  fwd and  back   Standing balance support: During functional activity, Single extremity supported, Reliant on assistive device for balance Standing balance-Leahy Scale: Poor Standing balance comment: reliant on RUE  support for static standl external assist for dynamic                            Cognition Arousal/Alertness:  (very sleepy) Behavior During Therapy: WFL for tasks assessed/performed Overall Cognitive Status: Within Functional Limits for tasks assessed                                 General Comments: falls asleep but arouses easily; follows commands consistently        Exercises General Exercises - Lower Extremity Ankle Circles/Pumps: AROM, Both, 5 reps Long Arc Quad: AROM, Strengthening, 10 reps, Seated    General Comments        Pertinent Vitals/Pain Pain Assessment Pain Assessment: Faces Faces Pain Scale: Hurts little more Pain Location: L UE Pain Descriptors / Indicators: Aching, Discomfort, Sore Pain Intervention(s): Limited activity within patient's tolerance, Monitored during session, Repositioned (declined ice)    Home Living                          Prior Function            PT Goals (current goals can now be found in the care plan section) Acute Rehab PT Goals Patient Stated Goal: return home with family and home aides to assist PT Goal Formulation: With patient Time For Goal Achievement: 12/25/22 Potential to Achieve Goals: Good Progress towards PT goals: Progressing toward goals    Frequency    Min 1X/week      PT Plan Current plan remains appropriate    Co-evaluation              AM-PAC PT "6 Clicks" Mobility   Outcome Measure  Help needed turning from your back to your side while in a flat bed without using bedrails?: A Little Help needed moving from lying on your back to sitting on the side of a flat bed without using bedrails?: A Little Help needed moving to and from a bed to a chair (including a wheelchair)?: A Little Help needed standing up from a chair using your arms (e.g., wheelchair or bedside chair)?: A Little Help needed to walk in hospital room?: A Lot Help needed climbing 3-5 steps  with a railing? : A Lot 6 Click Score: 16    End of Session Equipment Utilized During Treatment: Oxygen Activity Tolerance: Patient limited by fatigue;Other (comment) (very sleepy) Patient left: in chair;with call bell/phone within reach;with chair alarm set Nurse Communication: Mobility status PT Visit Diagnosis: Unsteadiness on feet (R26.81);Other abnormalities of gait and mobility (R26.89);Muscle weakness (generalized) (M62.81)     Time: 1610-9604 PT Time Calculation (min) (ACUTE ONLY): 15 min  Charges:  $Therapeutic Activity: 8-22 mins                     Delice Bison, PT  Acute Rehab Dept [facility]) [phone]  01/18/2023    [facility] 01/18/2023, 10:23 AM
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
## 2023-01-18 NOTE — [place] FL2 (Signed)
[place] MEDICAID FL2 LEVEL OF CARE FORM     IDENTIFICATION  Patient Name: [name] Birthdate: 1956/06/19 Sex: male Admission Date (Current Location): 01/12/2023  [facility] and [place] Number:  Producer, television/film/video and Address:  [facility],  [address] [place], [place] [address]      Provider Number: [id number]  Attending Physician Name and Address:  Meredeth Ide, MD  Relative Name and Phone Number:       Current Level of Care: Hospital Recommended Level of Care: Skilled Nursing Facility Prior Approval Number:    Date Approved/Denied:   PASRR Number: pending  Discharge Plan: SNF    Current Diagnoses: Patient Active Problem List   Diagnosis Date Noted   Humerus fracture 01/12/2023   Nausea 12/23/2022   Generalized weakness 12/22/2022   Oropharyngeal candidiasis 02/11/2022   Palliative care encounter 01/06/2022   Abnormal CT scan, bladder 01/06/2022   Single kidney 01/06/2022   Diarrhea 10/23/2021   Osteomyelitis ([name]) 07/15/2021   History of complete ray amputation of first toe of left foot ([name]) 04/24/2021   Osteomyelitis of second toe of left foot ([name]) 04/24/2021   Diabetic foot ulcer ([name]) 12/24/2020   Benign prostatic hyperplasia 11/08/2020   Body mass index (BMI) 38.0-38.9, adult 11/08/2020   Chronic diastolic heart failure ([name]) 11/08/2020   Chronic pain 11/08/2020   Diabetic peripheral neuropathy associated with type 2 diabetes mellitus ([name]) 11/08/2020   Diastolic dysfunction 11/08/2020   GERD without esophagitis 11/08/2020   Hearing loss 11/08/2020   Incontinence of feces 11/08/2020   Long term (current) use of insulin ([name]) 11/08/2020   Loss of appetite 11/08/2020   Nicotine dependence, unspecified, uncomplicated 11/08/2020   Peripheral neuropathy 11/08/2020   Personal history of other malignant neoplasm of kidney 11/08/2020   Severe hyperglycemia due to diabetes mellitus ([name]) 11/08/2020   Urinary tract obstruction 11/08/2020    Asymptomatic varicose veins of bilateral lower extremities 11/08/2020   Chronic respiratory failure with hypoxia ([name]) 12/23/2018   UTI symptoms 11/08/2018   Abdominal distention 11/08/2018   Chest pain 11/08/2018   Hyperglycemia due to diabetes mellitus ([name]) 11/08/2018   Polypharmacy 06/03/2018   Diabetes mellitus due to underlying condition, uncontrolled, with hyperglycemia, with long-term current use of insulin ([name]) 06/03/2018   Decreased mobility and endurance 07/19/2015   CKD (chronic kidney disease) stage 3, GFR 30-59 ml/min ([name]) 07/06/2015   Interstitial lung disease ([name]) 04/10/2015   Cough syncope 04/10/2015   Dyspnea 04/10/2015   Atypical chest pain 02/28/2015   Syncope 02/28/2015   Orthostatic dizziness 02/25/2015   Right leg pain 02/25/2015   Right calf pain 02/14/2015   Malaise and fatigue 02/14/2015   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[name]) 11/12/2014   Smoking 11/12/2014   History of stroke 10/24/2014   Carotid disease, bilateral ([name]) 10/24/2014   Orthostatic hypotension 10/24/2014   Obstructive sleep apnea    Mixed hyperlipidemia    Acute on chronic renal failure ([name])    Renal cell carcinoma ([name])    Depression    Anxiety state    Noncompliance with medication regimen    Carotid artery stenosis 10/08/2014   Dizziness 09/26/2014   Left sided numbness 09/26/2014   Hyperreflexia 09/26/2014   Essential hypertension 09/26/2014   COPD (chronic obstructive pulmonary disease) ([name]) 08/16/2014   ILD (interstitial lung disease) ([name]) 08/16/2014   Dizziness and giddiness 08/16/2014   Other specified hypotension 08/16/2014   Snoring 04/30/2014   Fluid overload 02/22/2014   Renal neoplasm 02/21/2014  Preoperative cardiovascular examination 02/07/2014   Renal mass, right 12/21/2013   Coronary artery disease involving native coronary artery of native heart without angina pectoris    Type 2 diabetes mellitus ([name])    Substance abuse ([name])    Hyperlipidemia     Diabetes mellitus type 2, uncontrolled, with cardiovascular complications 06/01/2012   Tobacco abuse 06/01/2012   H/O medication noncompliance 06/01/2012   Cocaine abuse ([name]) 06/01/2012   Essential hypertension, benign     Orientation RESPIRATION BLADDER Height & Weight     Self, Time, Situation  O2 Continent, Indwelling catheter Weight: 224 lb 6.9 oz (101.8 kg) Height:  5\' 9"  (175.3 cm)  BEHAVIORAL SYMPTOMS/MOOD NEUROLOGICAL BOWEL NUTRITION STATUS      Continent Diet (heart healthy)  AMBULATORY STATUS COMMUNICATION OF NEEDS Skin   Limited Assist Verbally Other (Comment) (surgical incision)                       Personal Care Assistance Level of Assistance  Bathing, Feeding, Dressing Bathing Assistance: Limited assistance Feeding assistance: Limited assistance Dressing Assistance: Limited assistance     Functional Limitations Info  Sight, Hearing, Speech Sight Info: Adequate Hearing Info: Adequate Speech Info: Adequate    SPECIAL CARE FACTORS FREQUENCY  PT (By licensed PT), OT (By licensed OT)     PT Frequency: 5x/wk OT Frequency: 5x/wk            Contractures Contractures Info: Not present    Additional Factors Info  Code Status, Allergies, Psychotropic Code Status Info: DNR Allergies Info: Lisinopril, Pollen Extract, Oxycodone Psychotropic Info: see MAR         Current Medications (01/18/2023):  This is the current hospital active medication list Current Facility-Administered Medications  Medication Dose Route Frequency Provider Last Rate Last Admin   0.9 %  sodium chloride infusion   Intravenous Continuous Elease Etienne, MD 50 mL/hr at 01/17/23 1532 Infusion Verify at 01/17/23 1532   albuterol (PROVENTIL) (2.5 MG/3ML) 0.083% nebulizer solution 2.5 mg  2.5 mg Nebulization Q2H PRN McBane, Jerald Kief, PA-C       ALPRAZolam Prudy Feeler) tablet 1 mg  1 mg Oral TID Marcellus Scott D, MD   1 mg at 01/18/23 0901   atorvastatin (LIPITOR) tablet 20 mg  20 mg  Oral QHS Vernetta Honey, PA-C   20 mg at 01/17/23 2128   budesonide (PULMICORT) nebulizer solution 0.5 mg  0.5 mg Nebulization BID Marcellus Scott D, MD   0.5 mg at 01/18/23 1610   clopidogrel (PLAVIX) tablet 75 mg  75 mg Oral Daily Marcellus Scott D, MD   75 mg at 01/18/23 0853   diltiazem (CARDIZEM) tablet 60 mg  60 mg Oral Q12H Delano Metz, MD   60 mg at 01/18/23 0855   diphenhydrAMINE (BENADRYL) 12.5 MG/5ML elixir 12.5-25 mg  12.5-25 mg Oral Q4H PRN McBane, Jerald Kief, PA-C       diphenhydrAMINE (BENADRYL) capsule 50 mg  50 mg Oral QHS Hongalgi, Anand D, MD   50 mg at 01/17/23 2128   docusate sodium (COLACE) capsule 100 mg  100 mg Oral BID Vernetta Honey, PA-C   100 mg at 01/18/23 0854   FLUoxetine (PROZAC) capsule 40 mg  40 mg Oral Daily Marcellus Scott D, MD   40 mg at 01/18/23 0855   gabapentin (NEURONTIN) capsule 300 mg  300 mg Oral QHS Elease Etienne, MD   300 mg at 01/17/23 2129   HYDROcodone-acetaminophen (NORCO) 7.5-325 MG per tablet  1 tablet  1 tablet Oral Q4H PRN Vernetta Honey, PA-C   1 tablet at 01/18/23 0250   HYDROcodone-acetaminophen (NORCO/VICODIN) 5-325 MG per tablet 1 tablet  1 tablet Oral Q4H PRN Vernetta Honey, PA-C   1 tablet at 01/15/23 6045   insulin aspart (novoLOG) injection 0-9 Units  0-9 Units Subcutaneous TID WC Elease Etienne, MD   2 Units at 01/18/23 0852   insulin regular human CONCENTRATED (HUMULIN R) 500 UNIT/ML KwikPen 45 Units  45 Units Subcutaneous TID WC Elease Etienne, MD   45 Units at 01/18/23 4098   magnesium citrate solution 1 Bottle  1 Bottle Oral Once PRN McBane, Jerald Kief, PA-C       menthol-cetylpyridinium (CEPACOL) lozenge 3 mg  1 lozenge Oral PRN McBane, Jerald Kief, PA-C       Or   phenol (CHLORASEPTIC) mouth spray 1 spray  1 spray Mouth/Throat PRN McBane, Jerald Kief, PA-C       midodrine (PROAMATINE) tablet 2.5 mg  2.5 mg Oral BID WC Delano Metz, MD   2.5 mg at 01/16/23 1830   morphine (PF) 2 MG/ML injection 0.5  mg  0.5 mg Intravenous Q4H PRN McBane, Caroline N, PA-C       ondansetron (ZOFRAN) tablet 4 mg  4 mg Oral Q6H PRN McBane, Jerald Kief, PA-C       Or   ondansetron (ZOFRAN) injection 4 mg  4 mg Intravenous Q6H PRN McBane, Caroline N, PA-C       pantoprazole (PROTONIX) EC tablet 40 mg  40 mg Oral Daily Vernetta Honey, PA-C   40 mg at 01/18/23 0853   polyethylene glycol (MIRALAX / GLYCOLAX) packet 17 g  17 g Oral Daily PRN McBane, Jerald Kief, PA-C       pregabalin (LYRICA) capsule 25 mg  25 mg Oral BID Vernetta Honey, PA-C   25 mg at 01/18/23 0853   ranolazine (RANEXA) 12 hr tablet 500 mg  500 mg Oral BID Vernetta Honey, PA-C   500 mg at 01/18/23 1191   revefenacin (YUPELRI) nebulizer solution 175 mcg  175 mcg Nebulization Daily Elease Etienne, MD   175 mcg at 01/18/23 4782   tamsulosin (FLOMAX) capsule 0.4 mg  0.4 mg Oral Daily Elease Etienne, MD   0.4 mg at 01/18/23 9562     Discharge Medications: Please see discharge summary for a list of discharge medications.  Relevant Imaging Results:  Relevant Lab Results:   Additional Information SS# [id number]  Amada Jupiter, LCSW

## 2023-01-18 NOTE — Plan of Care (Signed)
  Problem: Education: Goal: Knowledge of General Education information will improve Description: Including pain rating scale, medication(s)/side effects and non-pharmacologic comfort measures Outcome: Progressing   Problem: Health Behavior/Discharge Planning: Goal: Ability to manage health-related needs will improve Outcome: Progressing   Problem: Clinical Measurements: Goal: Ability to maintain clinical measurements within normal limits will improve Outcome: Adequate for Discharge Goal: Will remain free from infection Outcome: Progressing Goal: Diagnostic test results will improve Outcome: Progressing Goal: Respiratory complications will improve Outcome: Progressing Goal: Cardiovascular complication will be avoided Outcome: Progressing   Problem: Activity: Goal: Risk for activity intolerance will decrease Outcome: Adequate for Discharge   Problem: Nutrition: Goal: Adequate nutrition will be maintained Outcome: Completed/Met   Problem: Coping: Goal: Level of anxiety will decrease Outcome: Progressing   Problem: Elimination: Goal: Will not experience complications related to bowel motility Outcome: Progressing Goal: Will not experience complications related to urinary retention Outcome: Progressing   Problem: Pain Managment: Goal: General experience of comfort will improve Outcome: Progressing   Problem: Safety: Goal: Ability to remain free from injury will improve Outcome: Progressing   Problem: Skin Integrity: Goal: Risk for impaired skin integrity will decrease Outcome: Progressing   Problem: Education: Goal: Knowledge of the prescribed therapeutic regimen will improve Outcome: Adequate for Discharge Goal: Understanding of activity limitations/precautions following surgery will improve Outcome: Adequate for Discharge Goal: Individualized Educational Video(s) Outcome: Completed/Met   Problem: Activity: Goal: Ability to tolerate increased activity will  improve Outcome: Progressing   Problem: Pain Management: Goal: Pain level will decrease with appropriate interventions Outcome: Progressing

## 2023-01-19 LAB — GLUCOSE, CAPILLARY
Glucose-Capillary: 73 mg/dL (ref 70–99)
Glucose-Capillary: 213 mg/dL — ABNORMAL HIGH (ref 70–99)
Glucose-Capillary: 190 mg/dL — ABNORMAL HIGH (ref 70–99)

## 2023-01-19 LAB — BASIC METABOLIC PANEL
Anion gap: 8 (ref 5–15)
BUN: 30 mg/dL — ABNORMAL HIGH (ref 8–23)
CO2: 25 mmol/L (ref 22–32)
Calcium: 8.6 mg/dL — ABNORMAL LOW (ref 8.9–10.3)
Chloride: 102 mmol/L (ref 98–111)
Creatinine, Ser: 1.36 mg/dL — ABNORMAL HIGH (ref 0.61–1.24)
GFR, Estimated: 57 mL/min — ABNORMAL LOW (ref >60)
Glucose, Bld: 123 mg/dL — ABNORMAL HIGH (ref 70–99)
Potassium: 4.7 mmol/L (ref 3.5–5.1)
Sodium: 135 mmol/L (ref 135–145)

## 2023-01-19 MED ORDER — DILTIAZEM HCL 60 MG PO TABS: 60.0000 mg | ORAL_TABLET | Freq: Two times a day (BID) | ORAL | 2 refills | Status: DC

## 2023-01-19 NOTE — Plan of Care (Signed)
  Problem: Education: Goal: Knowledge of General Education information will improve Description: Including pain rating scale, medication(s)/side effects and non-pharmacologic comfort measures Outcome: Adequate for Discharge   Problem: Health Behavior/Discharge Planning: Goal: Ability to manage health-related needs will improve Outcome: Progressing   Problem: Clinical Measurements: Goal: Ability to maintain clinical measurements within normal limits will improve Outcome: Progressing Goal: Will remain free from infection Outcome: Adequate for Discharge Goal: Diagnostic test results will improve Outcome: Adequate for Discharge Goal: Respiratory complications will improve Outcome: Progressing Goal: Cardiovascular complication will be avoided Outcome: Adequate for Discharge   Problem: Activity: Goal: Risk for activity intolerance will decrease Outcome: Adequate for Discharge   Problem: Coping: Goal: Level of anxiety will decrease Outcome: Progressing   Problem: Elimination: Goal: Will not experience complications related to bowel motility Outcome: Completed/Met Goal: Will not experience complications related to urinary retention Outcome: Completed/Met   Problem: Pain Managment: Goal: General experience of comfort will improve Outcome: Adequate for Discharge   Problem: Safety: Goal: Ability to remain free from injury will improve Outcome: Progressing   Problem: Skin Integrity: Goal: Risk for impaired skin integrity will decrease Outcome: Adequate for Discharge   Problem: Education: Goal: Knowledge of the prescribed therapeutic regimen will improve Outcome: Adequate for Discharge Goal: Understanding of activity limitations/precautions following surgery will improve Outcome: Progressing   Problem: Activity: Goal: Ability to tolerate increased activity will improve Outcome: Progressing   Problem: Pain Management: Goal: Pain level will decrease with appropriate  interventions Outcome: Progressing

## 2023-01-19 NOTE — Discharge Summary (Signed)
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 performed according to the departmental dose-optimization program which includes automated exposure control, adjustment of the mA and/or kV according to patient size and/or use of iterative reconstruction technique. COMPARISON:  Head CT 05/24/2022. FINDINGS: CT HEAD FINDINGS Brain: No acute intracranial hemorrhage. Unchanged encephalomalacia in the right frontal operculum from prior right MCA territory infarct. Otherwise, gray-white differentiation is preserved. No hydrocephalus or extra-axial collection. No mass effect or midline shift. Vascular: No hyperdense vessel or unexpected calcification. Skull: No calvarial fracture or suspicious bone lesion. Skull base is unremarkable. Other: None. CT MAXILLOFACIAL FINDINGS Osseous: No fracture or mandibular dislocation. No destructive process. Orbits: Negative. No traumatic or inflammatory finding. Sinuses: Well aerated. Soft tissues: Mild soft tissue swelling of the right cheek. CT CERVICAL SPINE FINDINGS Alignment: Normal. Skull  base and vertebrae: No acute fracture. Normal craniocervical junction. No suspicious bone lesions. Soft tissues and spinal canal: No prevertebral fluid or swelling. No visible canal hematoma. Disc levels: Mild cervical spondylosis without high-grade spinal canal stenosis. Upper chest: Hazy opacity and interlobular septal thickening in the right lung apex, may reflect asymmetric pulmonary edema versus hypoventilatory changes. Other: Atherosclerotic calcifications of the carotid bulbs. IMPRESSION: 1. No acute intracranial process. 2. No acute facial bone fracture. Mild soft tissue swelling of the right cheek. 3. No acute fracture or traumatic listhesis of the cervical spine. 4. Hazy opacity and interlobular septal thickening in the right lung apex, may reflect asymmetric pulmonary edema versus hypoventilatory changes. Electronically Signed   By: Orvan Falconer M.D.   On: 12/27/2022 17:44   CT Cervical Spine Wo Contrast  Result Date: 12/27/2022 CLINICAL DATA:  Head trauma, minor (Age >= 65y); Neck trauma (Age >= 65y); Facial trauma, blunt. EXAM: CT HEAD WITHOUT CONTRAST CT MAXILLOFACIAL WITHOUT CONTRAST CT CERVICAL SPINE WITHOUT CONTRAST TECHNIQUE: Multidetector CT imaging of the head, cervical spine, and maxillofacial structures were performed using the standard protocol without intravenous contrast. Multiplanar CT image reconstructions of the cervical spine and maxillofacial structures were also generated. RADIATION DOSE REDUCTION: This exam was performed according to the departmental dose-optimization program which includes automated exposure control, adjustment of the mA and/or kV according to patient size and/or use of iterative reconstruction technique. COMPARISON:  Head CT 05/24/2022. FINDINGS: CT HEAD FINDINGS Brain: No acute intracranial hemorrhage. Unchanged encephalomalacia in the right frontal operculum from prior right MCA territory infarct. Otherwise, gray-white differentiation is preserved. No  hydrocephalus or extra-axial collection. No mass effect or midline shift. Vascular: No hyperdense vessel or unexpected calcification. Skull: No calvarial fracture or suspicious bone lesion. Skull base is unremarkable.  Other: None. CT MAXILLOFACIAL FINDINGS Osseous: No fracture or mandibular dislocation. No destructive process. Orbits: Negative. No traumatic or inflammatory finding. Sinuses: Well aerated. Soft tissues: Mild soft tissue swelling of the right cheek. CT CERVICAL SPINE FINDINGS Alignment: Normal. Skull base and vertebrae: No acute fracture. Normal craniocervical junction. No suspicious bone lesions. Soft tissues and spinal canal: No prevertebral fluid or swelling. No visible canal hematoma. Disc levels: Mild cervical spondylosis without high-grade spinal canal stenosis. Upper chest: Hazy opacity and interlobular septal thickening in the right lung apex, may reflect asymmetric pulmonary edema versus hypoventilatory changes. Other: Atherosclerotic calcifications of the carotid bulbs. IMPRESSION: 1. No acute intracranial process. 2. No acute facial bone fracture. Mild soft tissue swelling of the right cheek. 3. No acute fracture or traumatic listhesis of the cervical spine. 4. Hazy opacity and interlobular septal thickening in the right lung apex, may reflect asymmetric pulmonary edema versus hypoventilatory changes. Electronically Signed   By: Orvan Falconer M.D.   On: 12/27/2022 17:44   DG Shoulder Left  Result Date: 12/27/2022 CLINICAL DATA:  Pain after trauma EXAM: LEFT SHOULDER - 2 VIEW COMPARISON:  None Available. FINDINGS: Anterior dislocation of the humeral head. There is a displaced fracture of the greater tuberosity as well. Underlying osteopenia. Mild joint space loss of the AC joint. Extensive parenchymal opacity involving the left lung. Please correlate with prior chest x-ray 12/22/2022. IMPRESSION: Anterior shoulder dislocation with widely displaced fracture involving the humeral head in  the area of the greater tuberosity. Recommend follow up imaging after relocation. Electronically Signed   By: [name] M.D.   On: 12/27/2022 17:33   DG Chest Portable 1 View  Result Date: 12/22/2022 CLINICAL DATA:  Malaise EXAM: PORTABLE CHEST 1 VIEW COMPARISON:  09/04/2022.  05/24/2022. FINDINGS: Pleuroparenchymal scarring at the left base is stable since prior and comparing back to 05/24/2022. Right lung clear. Cardiopericardial silhouette is at upper limits of normal for size. Bones are diffusely demineralized. Telemetry leads overlie the chest. IMPRESSION: Chronic pleuroparenchymal scarring at the left base. No acute cardiopulmonary findings. Electronically Signed   By: Kennith Center M.D.   On: 12/22/2022 14:44    Microbiology: Results for orders placed or performed during the hospital encounter of 01/12/23  Surgical pcr screen     Status: None   Collection Time: 01/12/23  8:08 PM   Specimen: Nasal Mucosa; Nasal Swab  Result Value Ref Range Status   MRSA, PCR NEGATIVE NEGATIVE Final   Staphylococcus aureus NEGATIVE NEGATIVE Final    Comment: (NOTE) The Xpert SA Assay (FDA approved for NASAL specimens in patients 83 years of age and older), is one component of a comprehensive surveillance program. It is not intended to diagnose infection nor to guide or monitor treatment. Performed at [facility], [address], [place], [place] [address]     Labs: CBC: Recent Labs  Lab 01/12/23 1703 01/13/23 0448 01/15/23 0323 01/16/23 0408  WBC 8.5 7.1 11.5* 9.1  HGB 14.7 13.6 12.5* 12.2*  HCT 46.2 43.3 39.2 38.8*  MCV 84.3 86.1 84.3 85.5  PLT 208 172 214 182   Basic Metabolic Panel: Recent Labs  Lab 01/15/23 0323 01/16/23 0408 01/17/23 0326 01/18/23 0324 01/19/23 0349  NA 132* 133* 138 138 135  K 4.6 4.2 4.4 5.1 4.7  CL 98 100 107 104 102  CO2 23 22 25 26 25   GLUCOSE 114* 121* 149* 153* 123*  BUN 31* 39* 34* 25* 30*  CREATININE 2.10* 2.25* 1.77* 1.21  1.36*  CALCIUM 8.4* 8.0* 8.2* 8.8* 8.6*   Liver Function Tests: Recent Labs  Lab 01/12/23 1703  AST 11*  ALT 13  ALKPHOS 100  BILITOT 0.5  PROT 6.8  ALBUMIN 3.1*   CBG: Recent Labs  Lab 01/18/23 1133 01/18/23 1530 01/18/23 2024 01/19/23 0740 01/19/23 1148  GLUCAP 382* 173* 113* 213* 190*    Discharge time spent: greater than 30 minutes.  Signed: Meredeth Ide, MD [facility] 01/19/2023

## 2023-01-19 NOTE — Care Management Important Message (Signed)
Important Message  Patient Details IM Letter given. Name: KARMELLO SILCOX MRN: [id number] Date of Birth: 10-Sep-1955   Medicare Important Message Given:  Yes     Caren Macadam 01/19/2023, 1:52 PM

## 2023-01-19 NOTE — Progress Notes (Signed)
Discharge instructions reviewed with pt.  IV removed.  Pt awaiting ride home.

## 2023-01-19 NOTE — Progress Notes (Signed)
Occupational Therapy Treatment Patient Details Name: [name] MRN: [id number] DOB: [birth year] Today's Date: 01/19/2023   History of present illness 67 yo male presents to therapy s/p surgical repair of L humeral fx with pt undergoing L TSA, L greater tuberosity ORIF, open reduction of L shoulder, and augmentation of L shoulder anterior gelnoid with autograft bone on 01/13/2023. Pt sustained fall on 12/27/2022 and sustained irreducible L shoulder dislocation. Pt is currently in sling at all times, L UE NWB and no L shoulder ROM. Pt had recent hospital admission 6/4-6/6 due to diarrhea and progressive weakness with dx of gastroenteritis. 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   OT comments  Patient was educated on sling placement, proper positioning of shoulder in recliner with pillows, and LUE exercises. Patient needed max A to complete tasks with multimodal cues. Patient will need 24/7 caregiver support to maintain LUE restrictions in next level of care. Patient's discharge plan remains appropriate at this time. OT will continue to follow acutely.     Recommendations for follow up therapy are one component of a multi-disciplinary discharge planning process, led by the attending physician.  Recommendations may be updated based on patient status, additional functional criteria and insurance authorization.    Assistance Recommended at Discharge Frequent or constant Supervision/Assistance  Patient can return home with the following  A little help with walking and/or transfers;Assistance with cooking/housework;Assist for transportation;Help with stairs or ramp for entrance;A little  help with bathing/dressing/bathroom   Equipment Recommendations  None recommended by OT       Precautions / Restrictions Precautions Precautions: Fall;Shoulder Type of Shoulder Precautions: no L shoulder ROM, elbow to hand only Shoulder Interventions: Shoulder sling/immobilizer;Off for dressing/bathing/exercises Precaution Booklet Issued: Yes (comment) Precaution Comments: pt able to recall shoulder precautions, NWB Required Braces or Orthoses: Sling Restrictions Weight Bearing Restrictions: Yes LUE Weight Bearing: Non weight bearing       Mobility Bed Mobility               General bed mobility comments: up in recliner and remained in same at end of session                 ADL either performed or assessed with clinical judgement   ADL Overall ADL's : Needs assistance/impaired       General ADL Comments: patient was TD for donning sling with patient having no awareness of strap placement with verbal and visual demonstration. Patient was educated on proper positioning of shoudler and ice machine. patient verbalized understanding with max A for set up. patient reported he wanted to transition home. patient was educated that he would need increased caregiver support from 4 hours a day to be able to transition home at this time.      Cognition Arousal/Alertness: Awake/alert Behavior During Therapy: Flat affect Overall Cognitive Status: Within Functional Limits for tasks assessed              Exercises Shoulder Exercises Elbow Extension:  (unable to complete this exercise) Wrist Flexion: 10 reps, AROM (with increased time and cues for proper positioning) Wrist Extension: 10 reps, AROM Digit Composite Flexion: 10 reps, AROM Composite Extension: 10 reps, AROM  Pertinent Vitals/ Pain       Pain Assessment Pain Assessment: Faces Faces Pain Scale: Hurts little more Pain Location: L UE Pain Descriptors / Indicators: Aching, Discomfort,  Sore Pain Intervention(s): Limited activity within patient's tolerance, Monitored during session         Frequency  Min 1X/week        Progress Toward Goals  OT Goals(current goals can now be found in the care plan section)  Progress towards OT goals: Progressing toward goals     Plan Discharge plan remains appropriate       AM-PAC OT "6 Clicks" Daily Activity     Outcome Measure   Help from another person eating meals?: None Help from another person taking care of personal grooming?: A Little Help from another person toileting, which includes using toliet, bedpan, or urinal?: A Little Help from another person bathing (including washing, rinsing, drying)?: A Little Help from another person to put on and taking off regular upper body clothing?: A Little Help from another person to put on and taking off regular lower body clothing?: A Little 6 Click Score: 19    End of Session Equipment Utilized During Treatment: Oxygen  OT Visit Diagnosis: Unsteadiness on feet (R26.81);Other abnormalities of gait and mobility (R26.89);Muscle weakness (generalized) (M62.81)   Activity Tolerance Patient tolerated treatment well   Patient Left in chair;with call bell/phone within reach;with chair alarm set   Nurse Communication Other (comment) (ok to participate in session)        Time: 1610-9604 OT Time Calculation (min): 18 min  Charges: OT General Charges $OT Visit: 1 Visit OT Treatments $Self Care/Home Management : 8-22 mins  Rosalio Loud, MS Acute Rehabilitation Department Office# [phone]   Selinda Flavin 01/19/2023, 12:13 PM

## 2023-01-19 NOTE — TOC Transition Note (Signed)
Transition of Care [facility]) - CM/SW Discharge Note   Patient Details  Name: [name] MRN: [id number] Date of Birth: [birth year]  Transition of [facility]) CM/SW Contact:  Armanda Heritage, RN Phone Number: 01/19/2023, 1:40 PM   Clinical Narrative:     CM met with patient at bedside who reports he does NOT want to go to SNF and wishes to return home today.  Patient reports he lives with mother and has an aide 4 hours/day, daily.  Reports he has home oxygen and requests a bedside commode.  Patient is interested in HHPT services at well.  Patient reports at discharge aide will pick him up and has a portable concentrator for his use during transport.  [employer] rep Jermaine provided with referral for bedside commode and will deliver to bedside.  [employer] rep Elnita Maxwell has accepted referral for HHPT services.  No further TOC needs identified.  Final next level of care: Home w Home Health Services Barriers to Discharge: No Barriers Identified   Patient Goals and CMS Choice CMS Medicare.gov Compare Post Acute Care list provided to:: Patient Choice offered to / list presented to : Patient  Discharge Placement                         Discharge Plan and Services Additional resources added to the After Visit Summary for   In-house Referral: Clinical Social Work Discharge Planning Services: CM Consult Post Acute Care Choice: Home Health          DME Arranged: Bedside commode DME Agency: Other - Comment Loyal Buba) Date DME Agency Contacted: 01/19/23 Time DME Agency Contacted: 1320 Representative spoke with at DME Agency: Vaughan Basta HH Arranged: PT HH Agency: [employer] [facility] Date [facility] Agency Contacted: 01/19/23 Time HH Agency Contacted: 1340 Representative spoke with at [facility] Agency: Elnita Maxwell  Social Determinants of Health (SDOH) Interventions SDOH Screenings   Food Insecurity: No Food Insecurity (01/12/2023)  Housing: Patient Declined (01/12/2023)  Transportation Needs:  No Transportation Needs (01/12/2023)  Utilities: Not At Risk (01/12/2023)  Depression (PHQ2-9): Low Risk  (04/24/2022)  Financial Resource Strain: Low Risk  (05/19/2019)  Physical Activity: Inactive (05/19/2019)  Social Connections: Unknown (05/19/2019)  Stress: No Stress Concern Present (05/19/2019)  Tobacco Use: High Risk (01/14/2023)     Readmission Risk Interventions    01/19/2023    1:19 PM 01/13/2023   10:44 AM  Readmission Risk Prevention Plan  Transportation Screening  Complete  HRI or Home Care Consult  Complete  Social Work Consult for Recovery Care Planning/Counseling  Complete  Palliative Care Screening  Not Applicable  Medication Review [employer])  Complete  PCP or Specialist appointment within 3-5 days of discharge Complete   HRI or Home Care Consult Complete   SW Recovery Care/Counseling Consult Complete   Palliative Care Screening Not Applicable   Skilled Nursing Facility Not Applicable

## 2023-01-20 ENCOUNTER — Ambulatory Visit: Payer: 59 | Admitting: Nurse Practitioner

## 2023-02-01 ENCOUNTER — Other Ambulatory Visit: Payer: Self-pay | Admitting: Nurse Practitioner

## 2023-02-01 DIAGNOSIS — J441 Chronic obstructive pulmonary disease with (acute) exacerbation: Secondary | ICD-10-CM

## 2023-02-02 ENCOUNTER — Emergency Department (HOSPITAL_COMMUNITY): Payer: 59

## 2023-02-02 ENCOUNTER — Observation Stay (HOSPITAL_COMMUNITY)
Admission: EM | Admit: 2023-02-02 | Discharge: 2023-02-03 | Disposition: A | Discharge status: Home / Self Care | Payer: 59 | Attending: Internal Medicine | Admitting: Internal Medicine

## 2023-02-02 ENCOUNTER — Encounter (HOSPITAL_COMMUNITY): Payer: Self-pay

## 2023-02-02 DIAGNOSIS — G4733 Obstructive sleep apnea (adult) (pediatric): Secondary | ICD-10-CM | POA: Diagnosis not present

## 2023-02-02 DIAGNOSIS — E1122 Type 2 diabetes mellitus with diabetic chronic kidney disease: Secondary | ICD-10-CM | POA: Diagnosis not present

## 2023-02-02 DIAGNOSIS — J189 Pneumonia, unspecified organism: Secondary | ICD-10-CM | POA: Diagnosis not present

## 2023-02-02 DIAGNOSIS — G9341 Metabolic encephalopathy: Secondary | ICD-10-CM | POA: Diagnosis not present

## 2023-02-02 DIAGNOSIS — Z794 Long term (current) use of insulin: Secondary | ICD-10-CM | POA: Diagnosis not present

## 2023-02-02 DIAGNOSIS — Z743 Need for continuous supervision: Secondary | ICD-10-CM | POA: Diagnosis not present

## 2023-02-02 DIAGNOSIS — J811 Chronic pulmonary edema: Secondary | ICD-10-CM | POA: Diagnosis not present

## 2023-02-02 DIAGNOSIS — F17210 Nicotine dependence, cigarettes, uncomplicated: Secondary | ICD-10-CM | POA: Insufficient documentation

## 2023-02-02 DIAGNOSIS — R0602 Shortness of breath: Secondary | ICD-10-CM | POA: Diagnosis not present

## 2023-02-02 DIAGNOSIS — J449 Chronic obstructive pulmonary disease, unspecified: Secondary | ICD-10-CM | POA: Diagnosis not present

## 2023-02-02 DIAGNOSIS — N189 Chronic kidney disease, unspecified: Secondary | ICD-10-CM | POA: Diagnosis not present

## 2023-02-02 DIAGNOSIS — I2510 Atherosclerotic heart disease of native coronary artery without angina pectoris: Secondary | ICD-10-CM | POA: Diagnosis not present

## 2023-02-02 DIAGNOSIS — R4020 Unspecified coma: Secondary | ICD-10-CM | POA: Diagnosis not present

## 2023-02-02 DIAGNOSIS — I10 Essential (primary) hypertension: Secondary | ICD-10-CM | POA: Diagnosis present

## 2023-02-02 DIAGNOSIS — N179 Acute kidney failure, unspecified: Secondary | ICD-10-CM | POA: Insufficient documentation

## 2023-02-02 DIAGNOSIS — I6782 Cerebral ischemia: Secondary | ICD-10-CM | POA: Diagnosis not present

## 2023-02-02 DIAGNOSIS — R079 Chest pain, unspecified: Secondary | ICD-10-CM | POA: Diagnosis not present

## 2023-02-02 DIAGNOSIS — Z79899 Other long term (current) drug therapy: Secondary | ICD-10-CM | POA: Diagnosis not present

## 2023-02-02 DIAGNOSIS — E876 Hypokalemia: Secondary | ICD-10-CM | POA: Insufficient documentation

## 2023-02-02 DIAGNOSIS — Z7902 Long term (current) use of antithrombotics/antiplatelets: Secondary | ICD-10-CM | POA: Diagnosis not present

## 2023-02-02 DIAGNOSIS — J9611 Chronic respiratory failure with hypoxia: Secondary | ICD-10-CM | POA: Diagnosis not present

## 2023-02-02 DIAGNOSIS — E0865 Diabetes mellitus due to underlying condition with hyperglycemia: Secondary | ICD-10-CM

## 2023-02-02 DIAGNOSIS — E1165 Type 2 diabetes mellitus with hyperglycemia: Secondary | ICD-10-CM | POA: Insufficient documentation

## 2023-02-02 DIAGNOSIS — J849 Interstitial pulmonary disease, unspecified: Secondary | ICD-10-CM | POA: Diagnosis present

## 2023-02-02 DIAGNOSIS — Z85828 Personal history of other malignant neoplasm of skin: Secondary | ICD-10-CM | POA: Diagnosis not present

## 2023-02-02 DIAGNOSIS — E041 Nontoxic single thyroid nodule: Secondary | ICD-10-CM | POA: Diagnosis not present

## 2023-02-02 DIAGNOSIS — Z8673 Personal history of transient ischemic attack (TIA), and cerebral infarction without residual deficits: Secondary | ICD-10-CM | POA: Insufficient documentation

## 2023-02-02 DIAGNOSIS — R4182 Altered mental status, unspecified: Principal | ICD-10-CM

## 2023-02-02 DIAGNOSIS — R404 Transient alteration of awareness: Secondary | ICD-10-CM | POA: Diagnosis not present

## 2023-02-02 DIAGNOSIS — R918 Other nonspecific abnormal finding of lung field: Secondary | ICD-10-CM | POA: Diagnosis not present

## 2023-02-02 DIAGNOSIS — I129 Hypertensive chronic kidney disease with stage 1 through stage 4 chronic kidney disease, or unspecified chronic kidney disease: Secondary | ICD-10-CM | POA: Diagnosis not present

## 2023-02-02 DIAGNOSIS — R6889 Other general symptoms and signs: Secondary | ICD-10-CM | POA: Diagnosis not present

## 2023-02-02 DIAGNOSIS — N1832 Chronic kidney disease, stage 3b: Secondary | ICD-10-CM | POA: Insufficient documentation

## 2023-02-02 DIAGNOSIS — I6523 Occlusion and stenosis of bilateral carotid arteries: Secondary | ICD-10-CM | POA: Diagnosis not present

## 2023-02-02 DIAGNOSIS — I700 Atherosclerosis of aorta: Secondary | ICD-10-CM | POA: Diagnosis not present

## 2023-02-02 DIAGNOSIS — I6502 Occlusion and stenosis of left vertebral artery: Secondary | ICD-10-CM | POA: Diagnosis not present

## 2023-02-02 DIAGNOSIS — R0789 Other chest pain: Secondary | ICD-10-CM | POA: Diagnosis not present

## 2023-02-02 DIAGNOSIS — T50904A Poisoning by unspecified drugs, medicaments and biological substances, undetermined, initial encounter: Secondary | ICD-10-CM | POA: Diagnosis not present

## 2023-02-02 LAB — CBC WITH DIFFERENTIAL/PLATELET
Abs Immature Granulocytes: 0.07 10*3/uL (ref 0.00–0.07)
Basophils Absolute: 0 10*3/uL (ref 0.0–0.1)
Basophils Relative: 0 %
Eosinophils Absolute: 0.1 10*3/uL (ref 0.0–0.5)
Eosinophils Relative: 1 %
HCT: 36.7 % — ABNORMAL LOW (ref 39.0–52.0)
Hemoglobin: 11.8 g/dL — ABNORMAL LOW (ref 13.0–17.0)
Immature Granulocytes: 1 %
Lymphocytes Relative: 17 %
Lymphs Abs: 1.4 10*3/uL (ref 0.7–4.0)
MCH: 26.6 pg (ref 26.0–34.0)
MCHC: 32.2 g/dL (ref 30.0–36.0)
MCV: 82.8 fL (ref 80.0–100.0)
Monocytes Absolute: 0.6 10*3/uL (ref 0.1–1.0)
Monocytes Relative: 7 %
Neutro Abs: 6.3 10*3/uL (ref 1.7–7.7)
Neutrophils Relative %: 74 %
Platelets: 231 10*3/uL (ref 150–400)
RBC: 4.43 MIL/uL (ref 4.22–5.81)
RDW: 13.8 % (ref 11.5–15.5)
WBC: 8.4 10*3/uL (ref 4.0–10.5)
nRBC: 0 % (ref 0.0–0.2)

## 2023-02-02 LAB — URINALYSIS, ROUTINE W REFLEX MICROSCOPIC
Bilirubin Urine: NEGATIVE
Glucose, UA: NEGATIVE mg/dL
Hgb urine dipstick: NEGATIVE
Ketones, ur: NEGATIVE mg/dL
Leukocytes,Ua: NEGATIVE
Nitrite: NEGATIVE
Protein, ur: >300 mg/dL — AB
Specific Gravity, Urine: 1.02 (ref 1.005–1.030)
pH: 6 (ref 5.0–8.0)

## 2023-02-02 LAB — COMPREHENSIVE METABOLIC PANEL
ALT: 12 U/L (ref 0–44)
AST: 12 U/L — ABNORMAL LOW (ref 15–41)
Albumin: 2.9 g/dL — ABNORMAL LOW (ref 3.5–5.0)
Alkaline Phosphatase: 80 U/L (ref 38–126)
Anion gap: 9 (ref 5–15)
BUN: 20 mg/dL (ref 8–23)
CO2: 18 mmol/L — ABNORMAL LOW (ref 22–32)
Calcium: 7.8 mg/dL — ABNORMAL LOW (ref 8.9–10.3)
Chloride: 109 mmol/L (ref 98–111)
Creatinine, Ser: 1.66 mg/dL — ABNORMAL HIGH (ref 0.61–1.24)
GFR, Estimated: 45 mL/min — ABNORMAL LOW (ref >60)
Glucose, Bld: 174 mg/dL — ABNORMAL HIGH (ref 70–99)
Potassium: 3 mmol/L — ABNORMAL LOW (ref 3.5–5.1)
Sodium: 136 mmol/L (ref 135–145)
Total Bilirubin: 0.4 mg/dL (ref 0.3–1.2)
Total Protein: 6.1 g/dL — ABNORMAL LOW (ref 6.5–8.1)

## 2023-02-02 LAB — BLOOD GAS, VENOUS
Acid-base deficit: 4.9 mmol/L — ABNORMAL HIGH (ref 0.0–2.0)
Bicarbonate: 20.6 mmol/L (ref 20.0–28.0)
Drawn by: 66297
O2 Saturation: 87.6 %
Patient temperature: 36.7
pCO2, Ven: 38 mmHg — ABNORMAL LOW (ref 44–60)
pH, Ven: 7.33 (ref 7.25–7.43)
pO2, Ven: 57 mmHg — ABNORMAL HIGH (ref 32–45)

## 2023-02-02 LAB — URINALYSIS, MICROSCOPIC (REFLEX)

## 2023-02-02 LAB — RAPID URINE DRUG SCREEN, HOSP PERFORMED
Amphetamines: NOT DETECTED
Barbiturates: NOT DETECTED
Benzodiazepines: POSITIVE — AB
Cocaine: NOT DETECTED
Opiates: NOT DETECTED
Tetrahydrocannabinol: NOT DETECTED

## 2023-02-02 LAB — BRAIN NATRIURETIC PEPTIDE: B Natriuretic Peptide: 174 pg/mL — ABNORMAL HIGH (ref 0.0–100.0)

## 2023-02-02 LAB — CBG MONITORING, ED: Glucose-Capillary: 165 mg/dL — ABNORMAL HIGH (ref 70–99)

## 2023-02-02 LAB — TROPONIN I (HIGH SENSITIVITY)
Troponin I (High Sensitivity): 9 ng/L (ref <18)
Troponin I (High Sensitivity): 10 ng/L (ref <18)

## 2023-02-02 LAB — GLUCOSE, CAPILLARY: Glucose-Capillary: 132 mg/dL — ABNORMAL HIGH (ref 70–99)

## 2023-02-02 LAB — LACTIC ACID, PLASMA: Lactic Acid, Venous: 0.8 mmol/L (ref 0.5–1.9)

## 2023-02-02 LAB — MAGNESIUM: Magnesium: 1.3 mg/dL — ABNORMAL LOW (ref 1.7–2.4)

## 2023-02-02 LAB — PROCALCITONIN: Procalcitonin: <0.1 ng/mL

## 2023-02-02 MED ORDER — NALOXONE HCL 2 MG/2ML IJ SOSY
PREFILLED_SYRINGE | INTRAMUSCULAR | Status: AC
  Filled 2023-02-02: qty 2

## 2023-02-02 MED ORDER — SODIUM CHLORIDE 0.9 % IV SOLN
1.0000 g | Freq: Once | INTRAVENOUS | Status: AC
  Administered 2023-02-02: 1 g via INTRAVENOUS
  Filled 2023-02-02: qty 10

## 2023-02-02 MED ORDER — FUROSEMIDE 10 MG/ML IJ SOLN
20.0000 mg | Freq: Once | INTRAMUSCULAR | Status: AC
  Administered 2023-02-02: 20 mg via INTRAVENOUS
  Filled 2023-02-02: qty 2

## 2023-02-02 MED ORDER — POTASSIUM CHLORIDE 10 MEQ/100ML IV SOLN
10.0000 meq | INTRAVENOUS | Status: AC
  Administered 2023-02-02 (×2): 10 meq via INTRAVENOUS
  Filled 2023-02-02 (×2): qty 100

## 2023-02-02 MED ORDER — INSULIN ASPART 100 UNIT/ML IJ SOLN: 0.0000 [IU] | Freq: Four times a day (QID) | INTRAMUSCULAR | Status: DC

## 2023-02-02 MED ORDER — HEPARIN SODIUM (PORCINE) 5000 UNIT/ML IJ SOLN
5000.0000 [IU] | Freq: Three times a day (TID) | INTRAMUSCULAR | Status: DC
  Administered 2023-02-02 – 2023-02-03 (×3): 5000 [IU] via SUBCUTANEOUS
  Filled 2023-02-02 (×3): qty 1

## 2023-02-02 MED ORDER — NALOXONE HCL 0.4 MG/ML IJ SOLN
2.0000 mg | Freq: Once | INTRAMUSCULAR | Status: DC
  Filled 2023-02-02: qty 5

## 2023-02-02 MED ORDER — ACETAMINOPHEN 325 MG PO TABS
650.0000 mg | ORAL_TABLET | Freq: Four times a day (QID) | ORAL | Status: DC | PRN
  Administered 2023-02-03: 650 mg via ORAL
  Filled 2023-02-02: qty 2

## 2023-02-02 MED ORDER — ARFORMOTEROL TARTRATE 15 MCG/2ML IN NEBU
15.0000 ug | INHALATION_SOLUTION | Freq: Two times a day (BID) | RESPIRATORY_TRACT | Status: DC
Start: 2023-02-02 — End: 2023-02-02

## 2023-02-02 MED ORDER — IOHEXOL 350 MG/ML SOLN
50.0000 mL | Freq: Once | INTRAVENOUS | Status: AC | PRN
  Administered 2023-02-02: 50 mL via INTRAVENOUS

## 2023-02-02 MED ORDER — SODIUM CHLORIDE 0.9 % IV SOLN
500.0000 mg | INTRAVENOUS | Status: DC
  Administered 2023-02-02: 500 mg via INTRAVENOUS
  Filled 2023-02-02: qty 5

## 2023-02-02 MED ORDER — SODIUM CHLORIDE 0.9 % IV SOLN: 2.0000 g | INTRAVENOUS | Status: DC

## 2023-02-02 MED ORDER — ARFORMOTEROL TARTRATE 15 MCG/2ML IN NEBU
15.0000 ug | INHALATION_SOLUTION | Freq: Two times a day (BID) | RESPIRATORY_TRACT | Status: DC
  Administered 2023-02-03: 15 ug via RESPIRATORY_TRACT
  Filled 2023-02-02: qty 2

## 2023-02-02 MED ORDER — IOHEXOL 350 MG/ML SOLN
75.0000 mL | Freq: Once | INTRAVENOUS | Status: AC | PRN
  Administered 2023-02-02: 75 mL via INTRAVENOUS

## 2023-02-02 MED ORDER — INSULIN ASPART 100 UNIT/ML IJ SOLN
0.0000 [IU] | INTRAMUSCULAR | Status: DC
  Administered 2023-02-02: 1 [IU] via SUBCUTANEOUS

## 2023-02-02 MED ORDER — MAGNESIUM SULFATE 2 GM/50ML IV SOLN
2.0000 g | Freq: Once | INTRAVENOUS | Status: AC
  Administered 2023-02-02: 2 g via INTRAVENOUS
  Filled 2023-02-02: qty 50

## 2023-02-02 MED ORDER — POLYETHYLENE GLYCOL 3350 17 G PO PACK: 17.0000 g | PACK | Freq: Every day | ORAL | Status: DC | PRN

## 2023-02-02 MED ORDER — ACETAMINOPHEN 650 MG RE SUPP: 650.0000 mg | Freq: Four times a day (QID) | RECTAL | Status: DC | PRN

## 2023-02-02 MED ORDER — NALOXONE HCL 2 MG/2ML IJ SOSY
2.0000 mg | PREFILLED_SYRINGE | Freq: Once | INTRAMUSCULAR | Status: AC
  Administered 2023-02-02: 2 mg via INTRAVENOUS

## 2023-02-02 MED ORDER — POTASSIUM CHLORIDE IN NACL 40-0.9 MEQ/L-% IV SOLN: INTRAVENOUS | Status: AC

## 2023-02-02 MED ORDER — SODIUM CHLORIDE 0.9 % IV BOLUS
2000.0000 mL | Freq: Once | INTRAVENOUS | Status: AC
  Administered 2023-02-02: 2000 mL via INTRAVENOUS

## 2023-02-02 MED ORDER — ALBUTEROL SULFATE (2.5 MG/3ML) 0.083% IN NEBU: 2.5000 mg | INHALATION_SOLUTION | Freq: Four times a day (QID) | RESPIRATORY_TRACT | Status: DC | PRN

## 2023-02-02 MED ORDER — NALOXONE HCL 2 MG/2ML IJ SOSY
2.0000 mg | PREFILLED_SYRINGE | Freq: Once | INTRAMUSCULAR | Status: AC
  Administered 2023-02-02: 2 mg via INTRAVENOUS
  Filled 2023-02-02: qty 2

## 2023-02-02 NOTE — ED Notes (Signed)
Pt in MRI.

## 2023-02-02 NOTE — Assessment & Plan Note (Signed)
Stable

## 2023-02-02 NOTE — Assessment & Plan Note (Signed)
On 4 L during my examination sats 100%.

## 2023-02-02 NOTE — ED Provider Notes (Signed)
[place] EMERGENCY DEPARTMENT AT [facility] Provider Note   CSN: [id number] Arrival date & time: 02/02/23  1330     History {Add pertinent medical, surgical, social history, OB history to HPI:1} Chief Complaint  Patient presents with   Altered Mental Status    [name] is a 67 y.o. male.  Patient with a history of coronary artery disease COPD and diabetes and a stroke.  He had recent left shoulder surgery.  Patient was having chest pain today and called EMS.  He took 2 nitros and walk to the EMS but shortly became unresponsive.  Patient's blood pressure was 60 systolic.  EMS gave him some Narcan.  When he arrived in the emergency department he is very lethargic but his blood pressure improved  The history is provided by the EMS personnel. No language interpreter was used.  Altered Mental Status Presenting symptoms: behavior changes   Severity:  Severe Most recent episode:  Today Episode history:  Continuous Timing:  Constant Progression:  Waxing and waning Chronicity:  New Context: not alcohol use        Home Medications Prior to Admission medications   Medication Sig Start Date End Date Taking? Authorizing Provider  albuterol (PROVENTIL) (2.5 MG/3ML) 0.083% nebulizer solution Take 3 mLs (2.5 mg total) by nebulization every 6 (six) hours as needed for wheezing or shortness of breath. 09/04/22   Cobb, Ruby Cola, NP  ALPRAZolam Prudy Feeler) 1 MG tablet Take 1 mg by mouth 3 (three) times daily. 01/07/21   [provider]  atorvastatin (LIPITOR) 20 MG tablet Take 20 mg by mouth at bedtime. 04/26/18   [provider]  benzonatate (TESSALON) 200 MG capsule TAKE ONE CAPSULE BY MOUTH THREE TIMES DAILY AS NEEDED FOR COUGH Patient taking differently: Take 200 mg by mouth 3 (three) times daily as needed for cough. 09/29/22   Kalman Shan, MD  budesonide (PULMICORT) 1 MG/2ML nebulizer solution Generic for Pulmicort. Inhale one vial in nebulizer twice a  day. Rinse mouth after use. 02/01/23   Cobb, Ruby Cola, NP  clopidogrel (PLAVIX) 75 MG tablet TAKE ONE TABLET BY MOUTH EVERY DAY 05/27/22   Jonelle Sidle, MD  diltiazem (CARDIZEM) 60 MG tablet Take 1 tablet (60 mg total) by mouth every 12 (twelve) hours. 01/19/23   Meredeth Ide, MD  diphenhydrAMINE (BENADRYL) 25 mg capsule Take 50 mg by mouth at bedtime.    [provider]  esomeprazole (NEXIUM) 40 MG capsule Take 40 mg by mouth daily.    [provider]  FLUoxetine (PROZAC) 40 MG capsule Take 40 mg by mouth daily.    [provider]  formoterol (PERFOROMIST) 20 MCG/2ML nebulizer solution Take 2 mLs (20 mcg total) by nebulization 2 (two) times daily. Patient not taking: Reported on 01/12/2023 09/16/22   Noemi Chapel, NP  gabapentin (NEURONTIN) 300 MG capsule Take 900 mg by mouth 3 (three) times daily.    [provider]  insulin regular human CONCENTRATED (HUMULIN R) 500 UNIT/ML injection Inject 90 Units into the skin 3 (three) times daily.    [provider]  isosorbide mononitrate (IMDUR) 30 MG 24 hr tablet Take 30 mg by mouth every evening.    [provider]  isosorbide mononitrate (IMDUR) 60 MG 24 hr tablet Take 1 tablet (60 mg total) by mouth in the morning. & 30 mg in the evening Patient taking differently: Take 60 mg by mouth in the morning. 03/27/22   Jonelle Sidle, MD  linagliptin (TRADJENTA) 5 MG TABS tablet Take 5 mg by mouth daily.    [provider]  midodrine (PROAMATINE) 2.5 MG tablet Take 1 tablet (2.5 mg total) by mouth 2 (two) times daily. 08/11/22   Jonelle Sidle, MD  oxyCODONE (ROXICODONE) 5 MG immediate release tablet Take 1 tablet (5 mg total) by mouth every 4 (four) hours as needed for severe pain. 01/16/23   Cecil Cobbs, PA-C  pregabalin (LYRICA) 25 MG capsule Take 25 mg by mouth 2 (two) times daily. 02/14/19   [provider]  ranolazine (RANEXA) 500 MG 12 hr tablet Take 1 tablet  (500 mg total) by mouth 2 (two) times daily. 08/27/22   Sharlene Dory, NP  revefenacin [facility]) 175 MCG/3ML nebulizer solution Take 3 mLs (175 mcg total) by nebulization daily. Patient taking differently: Take 175 mcg by nebulization daily as needed (Wheezing/SOB). 09/16/22   Cobb, Ruby Cola, NP  tamsulosin (FLOMAX) 0.4 MG CAPS capsule Take 0.4 mg by mouth daily.     [provider]  zolpidem (AMBIEN) 5 MG tablet Take 5 mg by mouth at bedtime. 03/03/19   [provider]      Allergies    Lisinopril, Pollen extract, and Oxycodone    Review of Systems   Review of Systems  Unable to perform ROS: Mental status change    Physical Exam Updated Vital Signs BP (!) 167/92   Pulse 81   Temp (!) 97.2 F (36.2 C)   Resp (!) 23   SpO2 98%  Physical Exam Vitals and nursing note reviewed.  Constitutional:      Appearance: He is well-developed.     Comments: Lethargic but protecting his airway  HENT:     Head: Normocephalic.     Nose: Nose normal.  Eyes:     General: No scleral icterus.    Conjunctiva/sclera: Conjunctivae normal.  Neck:     Thyroid: No thyromegaly.  Cardiovascular:     Rate and Rhythm: Normal rate and regular rhythm.     Heart sounds: No murmur heard.    No friction rub. No gallop.  Pulmonary:     Breath sounds: No stridor. No wheezing or rales.  Chest:     Chest wall: No tenderness.  Abdominal:     General: There is no distension.     Tenderness: There is no abdominal tenderness. There is no rebound.  Musculoskeletal:     Cervical back: Neck supple.     Comments: Patient can move all extremities secondary to pain  Lymphadenopathy:     Cervical: No cervical adenopathy.  Skin:    Findings: No erythema or rash.  Neurological:     Motor: No abnormal muscle tone.     Coordination: Coordination normal.     ED Results / Procedures / Treatments   Labs (all labs ordered are listed, but only abnormal results are displayed) Labs Reviewed  CBC  WITH DIFFERENTIAL/PLATELET - Abnormal; Notable for the following components:      Result Value   Hemoglobin 11.8 (*)    HCT 36.7 (*)    All other components within normal limits  COMPREHENSIVE METABOLIC PANEL - Abnormal; Notable for the following components:   Potassium 3.0 (*)    CO2 18 (*)    Glucose, Bld 174 (*)    Creatinine, Ser 1.66 (*)    Calcium 7.8 (*)    Total Protein 6.1 (*)    Albumin 2.9 (*)    AST 12 (*)  GFR, Estimated 45 (*)    All other components within normal limits  BLOOD GAS, VENOUS - Abnormal; Notable for the following components:   pCO2, Ven 38 (*)    pO2, Ven 57 (*)    Acid-base deficit 4.9 (*)    All other components within normal limits  RAPID URINE DRUG SCREEN, HOSP PERFORMED - Abnormal; Notable for the following components:   Benzodiazepines POSITIVE (*)    All other components within normal limits  CBG MONITORING, ED - Abnormal; Notable for the following components:   Glucose-Capillary 165 (*)    All other components within normal limits  URINALYSIS, ROUTINE W REFLEX MICROSCOPIC  BRAIN NATRIURETIC PEPTIDE  TROPONIN I (HIGH SENSITIVITY)  TROPONIN I (HIGH SENSITIVITY)    EKG EKG Interpretation Date/Time:  Tuesday February 02 2023 13:37:28 EDT Ventricular Rate:  90 PR Interval:  216 QRS Duration:  101 QT Interval:  406 QTC Calculation: 497 R Axis:   -53  Text Interpretation: Sinus or ectopic atrial rhythm Atrial premature complexes Borderline prolonged PR interval Left anterior fascicular block Abnormal R-wave progression, late transition Borderline prolonged QT interval Confirmed by Bethann Berkshire [phone]) on 02/02/2023 2:19:52 PM  Radiology CT ANGIO HEAD NECK W WO CM  Result Date: 02/02/2023 CLINICAL DATA:  Stroke/TIA, determine embolic source EXAM: CT ANGIOGRAPHY HEAD AND NECK WITH AND WITHOUT CONTRAST TECHNIQUE: Multidetector CT imaging of the head and neck was performed using the standard protocol during bolus administration of intravenous  contrast. Multiplanar CT image reconstructions and MIPs were obtained to evaluate the vascular anatomy. Carotid stenosis measurements (when applicable) are obtained utilizing NASCET criteria, using the distal internal carotid diameter as the denominator. RADIATION DOSE REDUCTION: This exam was performed according to the departmental dose-optimization program which includes automated exposure control, adjustment of the mA and/or kV according to patient size and/or use of iterative reconstruction technique. CONTRAST:  75mL OMNIPAQUE IOHEXOL 350 MG/ML SOLN COMPARISON:  None Available. FINDINGS: CT HEAD FINDINGS Brain: Right frontal operculum encephalomalacia. No evidence of acute large vascular territory infarct, acute hemorrhage, mass lesion or midline shift. Vascular: See below. Skull: No acute fracture. Sinuses/Orbits: Clear sinuses.  No acute orbital findings. Other: No mastoid effusions. Review of the MIP images confirms the above findings CTA NECK FINDINGS Aortic arch: Great vessel origins are patent without significant stenosis. Calcific atherosclerosis. Right carotid system: Severe ulcerated atherosclerosis of the right carotid bifurcation and proximal ICA with approximately 60% stenosis, somewhat underestimated due to small caliber of the distal ICA. Left carotid system: Occlusion of the left ICA proximally. Non opacification of the remainder of the assay in the neck. Vertebral arteries: Right dominant. 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 Skeleton: No acute abnormality on limited assessment. Other neck: No acute abnormality on limited assessment. 1.2 cm right thyroid nodule shows not require further imaging follow-up (ref: J Am Coll Radiol. 2015 Feb;12(2): 143-50). Upper chest: Emphysema.  Aortic atherosclerosis. Review of the MIP images confirms the above findings CTA HEAD FINDINGS Anterior circulation: Age indeterminate occlusion of the left proximal  ICA in the neck with irregular reconstitution at the intracranial cavernous segment and opacified MCAs and ACAs. Right intracranial ICA, MCA and ACA are patent without proximal high-grade stenosis. Posterior circulation: Bilateral intradural vertebral arteries, basilar artery and bilateral posterior cerebral arteries are patent without proximal hemodynamically significant stenosis. Venous sinuses: As permitted by contrast timing, patent. Review of the MIP images confirms the above findings IMPRESSION: 1. Age indeterminate occlusion of the left proximal  ICA in the neck with irregular reconstitution at the intracranial cavernous segment and opacified MCAs and ACAs. 2. 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 Severe ulcerated atherosclerosis of the right carotid bifurcation and proximal ICA with approximately 60% stenosis, somewhat underestimated due to small caliber of the distal ICA. 4. Aortic Atherosclerosis (ICD10-I70.0) and Emphysema (ICD10-J43.9). Findings discussed with Dr. Estell Harpin via telephone at 3:40 p.m. Electronically Signed   By: Feliberto Harts M.D.   On: 02/02/2023 15:41   DG Chest Port 1 View  Result Date: 02/02/2023 CLINICAL DATA:  Shortness of breath and chest pain.  Unresponsive. EXAM: PORTABLE CHEST 1 VIEW COMPARISON:  01/16/2023 FINDINGS: Defibrillator overlies the chest. Cardiomegaly and aortic atherosclerosis as seen previously. Interstitial and early alveolar pulmonary edema. Chronic pleural and parenchymal scarring on the left. IMPRESSION: Interstitial and early alveolar pulmonary edema. Cardiomegaly. Aortic atherosclerosis. Defibrillator. Electronically Signed   By: Paulina Fusi M.D.   On: 02/02/2023 15:39    Procedures Procedures  {Document cardiac monitor, telemetry assessment procedure when appropriate:1}  Medications Ordered in ED Medications  sodium chloride 0.9 % bolus 2,000 mL (0 mLs Intravenous Stopped 02/02/23 1539)   naloxone (NARCAN) injection 2 mg (2 mg Intravenous Given 02/02/23 1345)  iohexol (OMNIPAQUE) 350 MG/ML injection 75 mL (75 mLs Intravenous Contrast Given 02/02/23 1512)  naloxone (NARCAN) injection 2 mg (2 mg Intravenous Given 02/02/23 1539)    ED Course/ Medical Decision Making/ A&P   {  CRITICAL CARE Performed by: Bethann Berkshire Total critical care time: 50 minutes Critical care time was exclusive of separately billable procedures and treating other patients. Critical care was necessary to treat or prevent imminent or life-threatening deterioration. Critical care was time spent personally by me on the following activities: development of treatment plan with patient and/or surrogate as well as nursing, discussions with consultants, evaluation of patient's response to treatment, examination of patient, obtaining history from patient or surrogate, ordering and performing treatments and interventions, ordering and review of laboratory studies, ordering and review of radiographic studies, pulse oximetry and re-evaluation of patient's condition.   Patient was given multiple doses of Narcan.  He did improve some.  He will answer some questions appropriately and moving extremities.  Patient is still very lethargic.  CT angio of the head and neck showed significant disease but I spoke with Dr. Selina Cooley neurology and looking at the CT and knowing his exam she said that he did not need any type of interventional procedure.  She recommended getting an MRI of the brain and if that is negative he can be a medicine admission Click here for ABCD2, HEART and other calculatorsREFRESH Note before signing :1}                          Medical Decision Making Amount and/or Complexity of Data Reviewed Labs: ordered. Radiology: ordered. ECG/medicine tests: ordered.  Risk Prescription drug management.   Chest pain and altered mental status  {Document critical care time when appropriate:1} {Document review of labs  and clinical decision tools ie heart score, Chads2Vasc2 etc:1}  {Document your independent review of radiology images, and any outside records:1} {Document your discussion with family members, caretakers, and with consultants:1} {Document social determinants of health affecting pt's care:1} {Document your decision making why or why not admission, treatments were needed:1} Final Clinical Impression(s) / ED Diagnoses Final diagnoses:  None    Rx / DC Orders ED Discharge Orders  None

## 2023-02-02 NOTE — Assessment & Plan Note (Addendum)
Currently somnolent.  Became unresponsive after taking 2 of nitro.  Hypotensive in the 60s.  Not back to baseline.  Some response to 2 mg of Narcan x 2.  UDS positive for benzos- Likely etiology of altered mental status, possible pneumonia, dehydration.  Possible hypotension from nitro. -Remain n.p.o. - Hydrate - CBg Q6h - MRI brain no acute intracranial process, Absent flow void in the petrous segment of the left ICA,compatible with occlusion seen on same day CT head and neck angiogram. 3. Chronic right MCA territory infarct. -CT head and neck age-indeterminate occlusion of left proximal ICA with irregular reconstitution at the intracranial cavernous segment.  See detailed report.  Also with severe ulcerated atherosclerosis of right carotid bifurcation and proximal ICA. - EDP spoke to Dr. Selina Cooley, patient did not need any type of interventional procedure. -Hold psychoactive home meds-Xanax, Lyrica, Prozac

## 2023-02-02 NOTE — Assessment & Plan Note (Addendum)
Creatinine 1.6, baseline 1-1.2. -2 L bolus given, continue N/s + 40kcl 75cc/hr x 12hrs

## 2023-02-02 NOTE — ED Triage Notes (Signed)
Per EMS  CP Took 2 nitroglycerin per to EMS 2. Unresponsive @ 1:15 A. Pt walked to stretcher B. Became unresponsive 3. Run of Vtach   18G RAC 500cc NS 2mg  narcan  Hx COPD, shoulder surgery 7 days ago

## 2023-02-02 NOTE — ED Notes (Signed)
MRI called to ask when patient will be going to MRI. Per MRI personnel they will get him later

## 2023-02-02 NOTE — Assessment & Plan Note (Deleted)
Creatinine 1.6, baseline 1 -1.3

## 2023-02-02 NOTE — ED Notes (Signed)
ED TO INPATIENT HANDOFF REPORT  ED Nurse Name and Phone #: Cat  S Name/Age/Gender [name] 67 y.o. male Room/Bed: APA11/APA11  Code Status   Code Status: Prior  Home/SNF/Other Home Disoriented x4 Is this baseline? No   Triage Complete: Triage complete  Chief Complaint Acute metabolic encephalopathy [G93.41]  Triage Note Per EMS  CP Took 2 nitroglycerin per to EMS 2. Unresponsive @ 1:15 A. Pt walked to stretcher B. Became unresponsive 3. Run of Vtach   18G RAC 500cc NS 2mg  narcan  Hx COPD, shoulder surgery 7 days ago      Allergies Allergies  Allergen Reactions   Lisinopril Other (See Comments)    Other reaction(s): worsening kidney function   Pollen Extract     Other reaction(s): sneezing   Oxycodone Itching and Nausea Only    Pt is able to tolerate if taken with benadryl    Level of Care/Admitting Diagnosis ED Disposition     ED Disposition  Admit   Condition  --   Comment  Hospital Area: [facility] [[id number]]  Level of Care: Telemetry [5]  Covid Evaluation: Asymptomatic - no recent exposure (last 10 days) testing not required  Diagnosis: Acute metabolic encephalopathy [[id number]]  Admitting Physician: Onnie Boer [[id number]]  Attending Physician: Onnie Boer [email or web address]          B Medical/Surgery History Past Medical History:  Diagnosis Date   Anxiety    Arthritis    Cholecystitis, acute 12/20/2013   Lap chole 6/5   Cocaine abuse ([name])    Community acquired pneumonia 05/31/2018   COPD (chronic obstructive pulmonary disease) ([name])    Coronary atherosclerosis of native coronary artery    a. 03/09/2013 Cath/PCI: LM nl, LAD: 50p, 1m (2.5x16 promus DES), LCX nl, OM1 min irregs, LPL/LPDA diff dzs, RCA nondom, mod diff dzs, EF 55%.   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  ([name]) 04/24/2021   History of nephrolithiasis    History of pneumonia    History of stroke    Right MCA distribution, residual left-sided weakness   Hyperlipidemia    Metabolic encephalopathy    Noncompliance    Osteomyelitis of second toe of left foot ([name]) 04/24/2021   Renal cell carcinoma ([name])    Status post radical right nephrectomy August 2015   Respiratory failure with hypoxia ([name])    Sepsis ([name]) 05/19/2019   Sleep apnea    On CPAP, 4L O2 no cpap at home yet   Type 2 diabetes mellitus ([name]) 2011   Past Surgical History:  Procedure Laterality Date   ABDOMINAL AORTOGRAM W/LOWER EXTREMITY Left 07/17/2021   Procedure: ABDOMINAL AORTOGRAM W/LOWER EXTREMITY;  Surgeon: Nada Libman, MD;  [facility]: [facility] INV[facility];  Service: Cardiovascular;  Laterality: Left;   AMPUTATION Left 12/27/2020   Procedure: PARTIAL FIRST RAY AMPUTATION OF FOOT;  Surgeon: Candelaria Stagers, DPM;  Location: [facility] OR;  Service: Podiatry;  Laterality: Left;   APPENDECTOMY  1970's   CHOLECYSTECTOMY N/A 12/22/2013   Procedure: LAPAROSCOPIC CHOLECYSTECTOMY ;  Surgeon: Shelly Rubenstein, MD;  Location: [facility] OR;  Service: General;  Laterality: N/A;   ENDARTERECTOMY Right 10/08/2014   Procedure: Right ENDARTERECTOMY CAROTID;  Surgeon: Larina Earthly, MD;  Location: [facility] OR;  Service: Vascular;  Laterality: Right;   LAPAROSCOPIC LYSIS OF ADHESIONS  02/21/2014   Procedure: LAPAROSCOPIC LYSIS OF ADHESIONS EXTINSIVE;  Surgeon: Sebastian Ache, MD;  Location: [facility] ORS;  Service: Urology;;   LEFT HEART CATHETERIZATION WITH CORONARY ANGIOGRAM N/A 06/02/2012   Procedure: LEFT HEART CATHETERIZATION WITH CORONARY ANGIOGRAM;  Surgeon: Herby Abraham, MD;  Location: [name] CATH LAB;  Service: Cardiovascular;  Laterality: N/A;   LEFT HEART CATHETERIZATION WITH CORONARY ANGIOGRAM N/A 03/09/2013   Procedure: LEFT HEART CATHETERIZATION WITH CORONARY ANGIOGRAM;  Surgeon: Tonny Bollman, MD;  Location: [facility] CATH LAB;  Service: Cardiovascular;   Laterality: N/A;   LEFT HEART CATHETERIZATION WITH CORONARY ANGIOGRAM N/A 12/20/2013   Procedure: LEFT HEART CATHETERIZATION WITH CORONARY ANGIOGRAM;  Surgeon: Kathleene Hazel, MD;  Location: [facility] CATH LAB;  Service: Cardiovascular;  Laterality: N/A;   LUNG BIOPSY Left 05/18/2014   Procedure: LUNG BIOPSY left upper lobe & left lower lobe;  Surgeon: Loreli Slot, MD;  Location: [facility] OR;  Service: Thoracic;  Laterality: Left;   PATCH ANGIOPLASTY Right 10/08/2014   Procedure: PATCH ANGIOPLASTY Right Carotid;  Surgeon: Larina Earthly, MD;  Location: [facility] OR;  Service: Vascular;  Laterality: Right;   PERIPHERAL VASCULAR INTERVENTION Left 07/17/2021   Procedure: PERIPHERAL VASCULAR INTERVENTION;  Surgeon: Nada Libman, MD;  [facility]: [facility];  Service: Cardiovascular;  Laterality: Left;  Superficial Femoral Artery   REVERSE SHOULDER ARTHROPLASTY Left 01/13/2023   Procedure: OPEN REDUCTION OF CHRONIC SHOULDER DISLOCATION, OPEN REDUCTION INTERNAL FIXATION OF TUBULAR FRACTURE, LEFT SHOULDER HEMIPLASTY, LATERJET AUGMENTATION;  Surgeon: Bjorn Pippin, MD;  Location: [facility] ORS;  Service: Orthopedics;  Laterality: Left;   ROBOT ASSISTED LAPAROSCOPIC NEPHRECTOMY Right 02/21/2014   Procedure: ROBOTIC ASSISTED LAPAROSCOPIC RIGHT NEPHRECTOMY ;  Surgeon: Sebastian Ache, MD;  Location: [facility] ORS;  Service: Urology;  Laterality: Right;   VIDEO ASSISTED THORACOSCOPY Left 05/18/2014   Procedure: LEFT VIDEO ASSISTED THORACOSCOPY;  Surgeon: Loreli Slot, MD;  Location: [facility] OR;  Service: Thoracic;  Laterality: Left;   VIDEO BRONCHOSCOPY  05/18/2014   Procedure: VIDEO BRONCHOSCOPY;  Surgeon: Loreli Slot, MD;  Location: [facility] OR;  Service: Thoracic;;     A IV Location/Drains/Wounds Patient Lines/Drains/Airways Status     Active Line/Drains/Airways     Name Placement date Placement time Site Days   Peripheral IV 02/02/23 18 G Anterior;Distal;Right;Upper Arm 02/02/23  1337  Arm  less than 1    Peripheral IV 02/02/23 18 G 1.16" Anterior;Distal;Left Forearm 02/02/23  1345  Forearm  less than 1            Intake/Output Last 24 hours  Intake/Output Summary (Last 24 hours) at 02/02/2023 1941 Last data filed at 02/02/2023 1334 Gross per 24 hour  Intake 500 ml  Output --  Net 500 ml    Labs/Imaging Results for orders placed or performed during the hospital encounter of 02/02/23 (from the past 48 hour(s))  CBG monitoring, ED     Status: Abnormal   Collection Time: 02/02/23  1:43 PM  Result Value Ref Range   Glucose-Capillary 165 (H) 70 - 99 mg/dL    Comment: Glucose reference range applies only to samples taken after fasting for at least 8 hours.  CBC with Differential     Status: Abnormal   Collection Time: 02/02/23  2:00 PM  Result Value Ref Range   WBC 8.4 4.0 - 10.5 K/uL   RBC 4.43 4.22 - 5.81 MIL/uL   Hemoglobin 11.8 (L) 13.0 - 17.0 g/dL   HCT 40.9 (L) 81.1 - 91.4 %   MCV 82.8  80.0 - 100.0 fL   MCH 26.6 26.0 - 34.0 pg   MCHC 32.2 30.0 - 36.0 g/dL   RDW 38.7 56.4 - 33.2 %   Platelets 231 150 - 400 K/uL   nRBC 0.0 0.0 - 0.2 %   Neutrophils Relative % 74 %   Neutro Abs 6.3 1.7 - 7.7 K/uL   Lymphocytes Relative 17 %   Lymphs Abs 1.4 0.7 - 4.0 K/uL   Monocytes Relative 7 %   Monocytes Absolute 0.6 0.1 - 1.0 K/uL   Eosinophils Relative 1 %   Eosinophils Absolute 0.1 0.0 - 0.5 K/uL   Basophils Relative 0 %   Basophils Absolute 0.0 0.0 - 0.1 K/uL   Immature Granulocytes 1 %   Abs Immature Granulocytes 0.07 0.00 - 0.07 K/uL    Comment: Performed at [facility], [address] [place], [place] [address]  Comprehensive metabolic panel     Status: Abnormal   Collection Time: 02/02/23  2:00 PM  Result Value Ref Range   Sodium 136 135 - 145 mmol/L   Potassium 3.0 (L) 3.5 - 5.1 mmol/L   Chloride 109 98 - 111 mmol/L   CO2 18 (L) 22 - 32 mmol/L   Glucose, Bld 174 (H) 70 - 99 mg/dL    Comment: Glucose reference range applies only to samples taken after fasting for at  least 8 hours.   BUN 20 8 - 23 mg/dL   Creatinine, Ser 4.16 (H) 0.61 - 1.24 mg/dL   Calcium 7.8 (L) 8.9 - 10.3 mg/dL   Total Protein 6.1 (L) 6.5 - 8.1 g/dL   Albumin 2.9 (L) 3.5 - 5.0 g/dL   AST 12 (L) 15 - 41 U/L   ALT 12 0 - 44 U/L   Alkaline Phosphatase 80 38 - 126 U/L   Total Bilirubin 0.4 0.3 - 1.2 mg/dL   GFR, Estimated 45 (L) >60 mL/min    Comment: (NOTE) Calculated using the CKD-EPI Creatinine Equation (2021)    Anion gap 9 5 - 15    Comment: Performed at [facility], [address], [place], [place] [address]  Troponin I (High Sensitivity)     Status: None   Collection Time: 02/02/23  2:00 PM  Result Value Ref Range   Troponin I (High Sensitivity) 9 <18 ng/L    Comment: (NOTE) Elevated high sensitivity troponin I (hsTnI) values and significant  changes across serial measurements may suggest ACS but many other  chronic and acute conditions are known to elevate hsTnI results.  Refer to the "Links" section for chest pain algorithms and additional  guidance. Performed at [facility], [address], [place], [place] [address]   Blood gas, venous (at [facility])     Status: Abnormal   Collection Time: 02/02/23  2:00 PM  Result Value Ref Range   pH, Ven 7.33 7.25 - 7.43   pCO2, Ven 38 (L) 44 - 60 mmHg   pO2, Ven 57 (H) 32 - 45 mmHg   Bicarbonate 20.6 20.0 - 28.0 mmol/L   Acid-base deficit 4.9 (H) 0.0 - 2.0 mmol/L   O2 Saturation 87.6 %   Patient temperature 36.7    Collection site BLOOD RIGHT FOREARM    Drawn by [phone]     Comment: Performed at [facility], [address], [place], [place] [address]  Brain natriuretic peptide     Status: Abnormal   Collection Time: 02/02/23  2:00 PM  Result Value Ref Range   B Natriuretic Peptide  174.0 (H) 0.0 - 100.0 pg/mL    Comment: Performed at [facility], [address], [place], [place] [address]  Urinalysis, Routine w reflex microscopic -Urine, Catheterized     Status: Abnormal   Collection Time: 02/02/23  2:10 PM  Result  Value Ref Range   Color, Urine YELLOW YELLOW   APPearance CLEAR CLEAR   Specific Gravity, Urine 1.020 1.005 - 1.030   pH 6.0 5.0 - 8.0   Glucose, UA NEGATIVE NEGATIVE mg/dL   Hgb urine dipstick NEGATIVE NEGATIVE   Bilirubin Urine NEGATIVE NEGATIVE   Ketones, ur NEGATIVE NEGATIVE mg/dL   Protein, ur >604 (A) NEGATIVE mg/dL   Nitrite NEGATIVE NEGATIVE   Leukocytes,Ua NEGATIVE NEGATIVE    Comment: Performed at [facility], [address] [place], [place] [address]  Rapid urine drug screen (hospital performed)     Status: Abnormal   Collection Time: 02/02/23  2:10 PM  Result Value Ref Range   Opiates NONE DETECTED NONE DETECTED   Cocaine NONE DETECTED NONE DETECTED   Benzodiazepines POSITIVE (A) NONE DETECTED   Amphetamines NONE DETECTED NONE DETECTED   Tetrahydrocannabinol NONE DETECTED NONE DETECTED   Barbiturates NONE DETECTED NONE DETECTED    Comment: (NOTE) DRUG SCREEN FOR MEDICAL PURPOSES ONLY.  IF CONFIRMATION IS NEEDED FOR ANY PURPOSE, NOTIFY LAB WITHIN 5 DAYS.  LOWEST DETECTABLE LIMITS FOR URINE DRUG SCREEN Drug Class                     Cutoff (ng/mL) Amphetamine and metabolites    1000 Barbiturate and metabolites    200 Benzodiazepine                 200 Opiates and metabolites        300 Cocaine and metabolites        300 THC                            50 Performed at [facility], [address] [place], [place] [address]   Urinalysis, Microscopic (reflex)     Status: Abnormal   Collection Time: 02/02/23  2:10 PM  Result Value Ref Range   RBC / HPF 0-5 0 - 5 RBC/hpf   WBC, UA 0-5 0 - 5 WBC/hpf   Bacteria, UA FEW (A) NONE SEEN   Squamous Epithelial / HPF 0-5 0 - 5 /HPF   Non Squamous Epithelial PRESENT (A) NONE SEEN   Hyaline Casts, UA PRESENT     Comment: Performed at [facility], [address], [place], [place] [address]  Troponin I (High Sensitivity)     Status: None   Collection Time: 02/02/23  3:46 PM  Result Value Ref Range   Troponin I (High  Sensitivity) 10 <18 ng/L    Comment: (NOTE) Elevated high sensitivity troponin I (hsTnI) values and significant  changes across serial measurements may suggest ACS but many other  chronic and acute conditions are known to elevate hsTnI results.  Refer to the "Links" section for chest pain algorithms and additional  guidance. Performed at [facility], [address], [place], [place] [address]    MR BRAIN WO CONTRAST  Result Date: 02/02/2023 CLINICAL DATA:  Mental status change, unknown cause EXAM: MRI HEAD WITHOUT CONTRAST TECHNIQUE: Multiplanar, multiecho pulse sequences of the brain and surrounding structures were obtained without intravenous contrast. COMPARISON:  Same day CT angiogram, CT head 12/27/2022. FINDINGS: Brain: Negative for an acute infarct. No hemorrhage.  No hydrocephalus. No extra-axial fluid collection. There is a chronic right MCA territory infarct. There is sequela of mild overall chronic microvascular ischemic change. Vascular: Absent flow void in the petrous segment of the left ICA (series 10, image 5). This is compatible with the occlusion seen on same day CT angiogram. Skull and upper cervical spine: Normal marrow signal. Sinuses/Orbits: Moderate left and small right mastoid effusion. No middle ear effusion. Polypoid mucosal thickening left maxillary sinus. Orbits are unremarkable. Other: None. IMPRESSION: 1. No acute intracranial process. 2. Absent flow void in the petrous segment of the left ICA, compatible with occlusion seen on same day CT head and neck angiogram. 3. Chronic right MCA territory infarct. Electronically Signed   By: Lorenza Cambridge M.D.   On: 02/02/2023 18:29   CT Angio Chest PE W and/or Wo Contrast  Result Date: 02/02/2023 CLINICAL DATA:  Altered mental status, high clinical suspicion for PE EXAM: CT ANGIOGRAPHY CHEST WITH CONTRAST TECHNIQUE: Multidetector CT imaging of the chest was performed using the standard protocol during bolus administration of  intravenous contrast. Multiplanar CT image reconstructions and MIPs were obtained to evaluate the vascular anatomy. RADIATION DOSE REDUCTION: This exam was performed according to the departmental dose-optimization program which includes automated exposure control, adjustment of the mA and/or kV according to patient size and/or use of iterative reconstruction technique. CONTRAST:  50mL OMNIPAQUE IOHEXOL 350 MG/ML SOLN COMPARISON:  Previous CT done on 05/24/2022 and chest radiograph done today FINDINGS: Cardiovascular: There is no demonstrable intimal flap in the thoracic aorta. Atherosclerotic plaques and calcifications are noted in thoracic aorta and its major branches. Significant atherosclerotic plaques are noted in the proximal course of left subclavian artery. There are no intraluminal filling defects in central pulmonary artery branches. Motion artifact and infiltrates limit evaluation of peripheral pulmonary artery branches. Mediastinum/Nodes: No significant lymphadenopathy is seen. Coarse calcifications are seen in right lobe thyroid. Lungs/Pleura: Breathing motion limits evaluation of lung fields. There are patchy infiltrates in left lower lobe. There is decreased volume in the left lung. There is prominence of interstitial markings in right parahilar region and right lower lung field. There is no significant pleural effusion or pneumothorax. Upper Abdomen: There is fatty infiltration in liver. Small hiatal hernia is seen. Musculoskeletal: No acute findings are seen. Postsurgical changes are noted in the left shoulder. Review of the MIP images confirms the above findings. IMPRESSION: There is no evidence of central pulmonary artery embolism. Evaluation of peripheral pulmonary artery branches is limited by breathing motion and infiltrates. There is no evidence of thoracic aortic dissection. Aortic arteriosclerosis. Significant atherosclerotic changes are noted in the proximal course of left subclavian artery.  Coronary artery calcifications are seen. Patchy infiltrates are seen in left lower lung field. There is subtle increase in interstitial markings in right lung. Findings suggest atelectasis/pneumonia and possibly interstitial edema or interstitial pneumonia. Evaluation of lung fields is limited by motion artifacts. Fatty liver.  Small hiatal hernia. Electronically Signed   By: Ernie Avena M.D.   On: 02/02/2023 17:17   CT ANGIO HEAD NECK W WO CM  Result Date: 02/02/2023 CLINICAL DATA:  Stroke/TIA, determine embolic source EXAM: CT ANGIOGRAPHY HEAD AND NECK WITH AND WITHOUT CONTRAST TECHNIQUE: Multidetector CT imaging of the head and neck was performed using the standard protocol during bolus administration of intravenous contrast. Multiplanar CT image reconstructions and MIPs were obtained to evaluate the vascular anatomy. Carotid stenosis measurements (when applicable) are obtained utilizing NASCET criteria, using the distal internal carotid diameter as the  denominator. RADIATION DOSE REDUCTION: This exam was performed according to the departmental dose-optimization program which includes automated exposure control, adjustment of the mA and/or kV according to patient size and/or use of iterative reconstruction technique. CONTRAST:  75mL OMNIPAQUE IOHEXOL 350 MG/ML SOLN COMPARISON:  None Available. FINDINGS: CT HEAD FINDINGS Brain: Right frontal operculum encephalomalacia. No evidence of acute large vascular territory infarct, acute hemorrhage, mass lesion or midline shift. Vascular: See below. Skull: No acute fracture. Sinuses/Orbits: Clear sinuses.  No acute orbital findings. Other: No mastoid effusions. Review of the MIP images confirms the above findings CTA NECK FINDINGS Aortic arch: Great vessel origins are patent without significant stenosis. Calcific atherosclerosis. Right carotid system: Severe ulcerated atherosclerosis of the right carotid bifurcation and proximal ICA with approximately 60%  stenosis, somewhat underestimated due to small caliber of the distal ICA. Left carotid system: Occlusion of the left ICA proximally. Non opacification of the remainder of the assay in the neck. Vertebral arteries: Right dominant. 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 Skeleton: No acute abnormality on limited assessment. Other neck: No acute abnormality on limited assessment. 1.2 cm right thyroid nodule shows not require further imaging follow-up (ref: J Am Coll Radiol. 2015 Feb;12(2): 143-50). Upper chest: Emphysema.  Aortic atherosclerosis. Review of the MIP images confirms the above findings CTA HEAD FINDINGS Anterior circulation: Age indeterminate occlusion of the left proximal ICA in the neck with irregular reconstitution at the intracranial cavernous segment and opacified MCAs and ACAs. Right intracranial ICA, MCA and ACA are patent without proximal high-grade stenosis. Posterior circulation: Bilateral intradural vertebral arteries, basilar artery and bilateral posterior cerebral arteries are patent without proximal hemodynamically significant stenosis. Venous sinuses: As permitted by contrast timing, patent. Review of the MIP images confirms the above findings IMPRESSION: 1. Age indeterminate occlusion of the left proximal ICA in the neck with irregular reconstitution at the intracranial cavernous segment and opacified MCAs and ACAs. 2. 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 Severe ulcerated atherosclerosis of the right carotid bifurcation and proximal ICA with approximately 60% stenosis, somewhat underestimated due to small caliber of the distal ICA. 4. Aortic Atherosclerosis (ICD10-I70.0) and Emphysema (ICD10-J43.9). Findings discussed with Dr. Estell Harpin via telephone at 3:40 p.m. Electronically Signed   By: Feliberto Harts M.D.   On: 02/02/2023 15:41   DG Chest Port 1 View  Result Date:  02/02/2023 CLINICAL DATA:  Shortness of breath and chest pain.  Unresponsive. EXAM: PORTABLE CHEST 1 VIEW COMPARISON:  01/16/2023 FINDINGS: Defibrillator overlies the chest. Cardiomegaly and aortic atherosclerosis as seen previously. Interstitial and early alveolar pulmonary edema. Chronic pleural and parenchymal scarring on the left. IMPRESSION: Interstitial and early alveolar pulmonary edema. Cardiomegaly. Aortic atherosclerosis. Defibrillator. Electronically Signed   By: Paulina Fusi M.D.   On: 02/02/2023 15:39    Pending Labs Unresulted Labs (From admission, onward)     Start     Ordered   02/02/23 1816  Lactic acid, plasma  Now then every 2 hours,   R (with STAT occurrences)      02/02/23 1815            Vitals/Pain Today's Vitals   02/02/23 1630 02/02/23 1700 02/02/23 1715 02/02/23 1815  BP: (!) 161/67   (!) 142/69  Pulse: 86 83 77 76  Resp: (!) 27 (!) 23 (!) 22 18  Temp: (!) 97.4 F (36.3 C) (!) 97.5 F (36.4 C) (!) 97.4 F (36.3 C)   TempSrc:  SpO2: 98% 97% 96% 96%    Isolation Precautions No active isolations  Medications Medications  azithromycin (ZITHROMAX) 500 mg in sodium chloride 0.9 % 250 mL IVPB (500 mg Intravenous New Bag/Given 02/02/23 1905)  sodium chloride 0.9 % bolus 2,000 mL (0 mLs Intravenous Stopped 02/02/23 1539)  naloxone (NARCAN) injection 2 mg (2 mg Intravenous Given 02/02/23 1345)  iohexol (OMNIPAQUE) 350 MG/ML injection 75 mL (75 mLs Intravenous Contrast Given 02/02/23 1512)  naloxone (NARCAN) injection 2 mg (2 mg Intravenous Given 02/02/23 1539)  iohexol (OMNIPAQUE) 350 MG/ML injection 50 mL (50 mLs Intravenous Contrast Given 02/02/23 1649)  furosemide (LASIX) injection 20 mg (20 mg Intravenous Given 02/02/23 1826)  cefTRIAXone (ROCEPHIN) 1 g in sodium chloride 0.9 % 100 mL IVPB (0 g Intravenous Stopped 02/02/23 1906)    Mobility non-ambulatory     Focused Assessments Neuro Assessment Handoff:         Neuro Assessment:   Neuro Checks:       Has TPA been given? No If patient is a Neuro Trauma and patient is going to OR before floor call report to 4N Charge nurse: [phone] or [phone]   R Recommendations: See Admitting Provider Note  Report given to:   Additional Notes:

## 2023-02-02 NOTE — Assessment & Plan Note (Addendum)
-  Electrolytes have been repleted -Continue to follow trend. -Patient advised to maintain adequate hydration and resume daily supplementation

## 2023-02-02 NOTE — ED Provider Notes (Signed)
4:17 PM Assumed care of patient from off-going team. For more details, please see note from same day.  In brief, this is a 67 y.o. male who was having CP, took 2 of his rx SL nitroglycerin, walked to ambulance/stretcher, went unresponsive w/ EMS w/ hypotension 60s. On arrival to ED, was lethargic but pressure okay. Has received narcan 3 times but still very sleepy. MAEs. CTA H&N, spoke with neuro, nothing for IR to work on. Initial trop 9. No leukocytosis. Did recently have surgery on his shoulder for L humerus fx. UDS +benzos. On 6L home [place] for chronic hypoxic resp failure with COPD. pCO2 not elevated.  Plan/Dispo at time of sign-out & ED Course since sign-out: [ ]  MRI brain wo contrast and if negative can admit to medicine [ ]  repeat trop Based on patient's history, will also order CT PE, though does not explain AMS.  BP (!) 167/92   Pulse 81   Temp (!) 97.2 F (36.2 C)   Resp (!) 23   SpO2 98%    ED Course:   Clinical Course as of 02/02/23 1936  Tue Feb 02, 2023  1732 Troponin I (High Sensitivity): 10 Repeat trop neg, flat [HN]  1812 CTA w/ patchy infiltrates, query pulm edema or interstitial PNA. No white count, afebrile, but is mildly hypothermic but not <36C. Does not meet SIRS criteria but lactate pending. He is tachypneic with elevated BNP, c/f HF/pulm edema. Does not take lasix at home, will give 20 mg IV lasix. Will also give IV abx to cover for PNA as well. Pending MRI. [HN]  1852 MR BRAIN WO CONTRAST 1. No acute intracranial process. 2. Absent flow void in the petrous segment of the left ICA, compatible with occlusion seen on same day CT head and neck angiogram. 3. Chronic right MCA territory infarct. [HN]  1935 EKG 12-Lead [ML]  1936 Pt admitted to hospitalist for further w/u and management. Given 2L NS, narcan, lasix, ceftriaxone, azithromycin. [HN]    Clinical Course User Index [HN] Loetta Rough, MD [ML] Welby, [place], Student-PA    ------------------------------- Vivi Barrack, MD Emergency Medicine  This note was created using dictation software, which may contain spelling or grammatical errors.   Loetta Rough, MD 02/02/23 [phone]

## 2023-02-02 NOTE — Assessment & Plan Note (Addendum)
-  Presents with altered mental status associated with transient hypotension after using nitroglycerin along with antihypertensive agents and analgesics. -CT angiogram demonstrated no pulmonary embolism and suggested the presence of atelectasis versus pneumonia; negative procalcitonin, no complaints of fever, normal WBCs, no productive coughing spells or worsening shortness of breath from baseline. -Patient remained asymptomatic from infection standpoint and pneumonia has been ruled out -Use of flutter valve and resumption of bronchodilator management along with mucolytic's has been recommended -Patient will continue home oxygen supplementation and outpatient follow-up with PCP.

## 2023-02-02 NOTE — Assessment & Plan Note (Signed)
-  Resume home antihypertensive agent -Heart healthy diet discussed with patient.

## 2023-02-02 NOTE — Assessment & Plan Note (Signed)
Resume home insulin therapy and the use of Tradjenta -Modified carbohydrate diet discussed with patient.

## 2023-02-02 NOTE — Assessment & Plan Note (Addendum)
CAD s/p DES to LAD 03/09/2013, PAD s/p L SFA stenting 06/2021.  Initial complaints of chest pain.  EKG without significant ST or T wave changes, troponin 9> 10.  Recent echo 12/2022 EF 55 to 60%, grade 1 DD. -Reassess in a.m. when patient mental status improved -Hold relaxing, Plavix, Imdur, while n.p.o.

## 2023-02-02 NOTE — H&P (Signed)
History and Physical    MACKAY HANAUER ZOX:[id number] DOB: May 24, 1956 DOA: 02/02/2023  PCP: Genia Hotter, FNP   Patient coming from: Home  I have personally briefly reviewed patient's old medical records in [facility]  Chief Complaint:   HPI: [name] is a 67 y.o. male with medical history significant for diabetes, diastolic CHF, coronary artery disease, substance use, stroke, COPD, CAD, chronic respiratory failure. Patient was brought to the ED via EMS with initial complaints of chest pain.  Patient took 2 nitroglycerin and walked to stretcher, and became unresponsive.  He was also hypotensive with systolic in the 60s.  At the time of my evaluation, patient is deeply asleep, hard to arouse, groans during my exam otherwise eyes are closed, moves upper extremities to some extent.  Recent hospitalization 6/25 to 7/2-for displaced left humeral fracture with dislocation, also with AKI on CKD 3B, acute urinary retention, hypotension.  Underwent surgery -6/26 by Dr. Everardo Pacific.   ED Course: Patient was still lethargic in the ED.  Blood pressure 109 improved to 160s.  O2 sats 95 to 100% on 6 L.  Temperature down to 97.  Heart rate 76-93.  Respirate rate 18-36 Troponin 9 >> 10. Potassium 3. VBG shows pH of 7.3, pCO2 of 38. BNP 174. CTA chest-limited exam, but no evidence of central pulmonary artery embolism.  Patchy infiltrates in left lower lung , increasing interstitial markings in the right lung.  Possible atelectasis/pneumonia versus interstitial edema or interstitial pneumonia. Patient still lethargic on arrival to ED 3 L bolus given. Narcan 2 mg x 2 given with minimal improvement in mental status. IV ceftriaxone and azithromycin started, which CTA chest findings 20 mg of Lasix subsequently given.  Review of Systems: Unable to assess due to altered mental status  Past Medical History:  Diagnosis Date   Anxiety    Arthritis    Cholecystitis, acute 12/20/2013   Lap chole  6/5   Cocaine abuse ([name])    Community acquired pneumonia 05/31/2018   COPD (chronic obstructive pulmonary disease) ([name])    Coronary atherosclerosis of native coronary artery    a. 03/09/2013 Cath/PCI: LM nl, LAD: 50p, 29m (2.5x16 promus DES), LCX nl, OM1 min irregs, LPL/LPDA diff dzs, RCA nondom, mod diff dzs, EF 55%.   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 ([name]) 04/24/2021   History of nephrolithiasis    History of pneumonia    History of stroke    Right MCA distribution, residual left-sided weakness   Hyperlipidemia    Metabolic encephalopathy    Noncompliance    Osteomyelitis of second toe of left foot ([name]) 04/24/2021   Renal cell carcinoma ([name])    Status post radical right nephrectomy August 2015   Respiratory failure with hypoxia ([name])    Sepsis ([name]) 05/19/2019   Sleep apnea    On CPAP, 4L O2 no cpap at home yet   Type 2 diabetes mellitus ([name]) 2011    Past Surgical History:  Procedure Laterality Date   ABDOMINAL AORTOGRAM W/LOWER EXTREMITY Left 07/17/2021   Procedure: ABDOMINAL AORTOGRAM W/LOWER EXTREMITY;  Surgeon: Nada Libman, MD;  Location: [facility] INV[facility] LAB;  Service: Cardiovascular;  Laterality: Left;   AMPUTATION Left 12/27/2020   Procedure: PARTIAL FIRST RAY AMPUTATION OF FOOT;  Surgeon: Candelaria Stagers, DPM;  Location: [facility]  OR;  Service: Podiatry;  Laterality: Left;   APPENDECTOMY  1970's   CHOLECYSTECTOMY N/A 12/22/2013   Procedure: LAPAROSCOPIC CHOLECYSTECTOMY ;  Surgeon: Shelly Rubenstein, MD;  Location: [facility] OR;  Service: General;  Laterality: N/A;   ENDARTERECTOMY Right 10/08/2014   Procedure: Right ENDARTERECTOMY CAROTID;  Surgeon: Larina Earthly, MD;  Location: [facility] OR;  Service: Vascular;  Laterality: Right;   LAPAROSCOPIC LYSIS OF ADHESIONS  02/21/2014   Procedure: LAPAROSCOPIC LYSIS OF ADHESIONS EXTINSIVE;  Surgeon:  Sebastian Ache, MD;  Location: [facility] ORS;  Service: Urology;;   LEFT HEART CATHETERIZATION WITH CORONARY ANGIOGRAM N/A 06/02/2012   Procedure: LEFT HEART CATHETERIZATION WITH CORONARY ANGIOGRAM;  Surgeon: Herby Abraham, MD;  Location: [facility] CATH LAB;  Service: Cardiovascular;  Laterality: N/A;   LEFT HEART CATHETERIZATION WITH CORONARY ANGIOGRAM N/A 03/09/2013   Procedure: LEFT HEART CATHETERIZATION WITH CORONARY ANGIOGRAM;  Surgeon: Tonny Bollman, MD;  Location: [facility] CATH LAB;  Service: Cardiovascular;  Laterality: N/A;   LEFT HEART CATHETERIZATION WITH CORONARY ANGIOGRAM N/A 12/20/2013   Procedure: LEFT HEART CATHETERIZATION WITH CORONARY ANGIOGRAM;  Surgeon: Kathleene Hazel, MD;  Location: [facility] CATH LAB;  Service: Cardiovascular;  Laterality: N/A;   LUNG BIOPSY Left 05/18/2014   Procedure: LUNG BIOPSY left upper lobe & left lower lobe;  Surgeon: Loreli Slot, MD;  Location: [facility] OR;  Service: Thoracic;  Laterality: Left;   PATCH ANGIOPLASTY Right 10/08/2014   Procedure: PATCH ANGIOPLASTY Right Carotid;  Surgeon: Larina Earthly, MD;  Location: [facility] OR;  Service: Vascular;  Laterality: Right;   PERIPHERAL VASCULAR INTERVENTION Left 07/17/2021   Procedure: PERIPHERAL VASCULAR INTERVENTION;  Surgeon: Nada Libman, MD;  Location: [facility];  Service: Cardiovascular;  Laterality: Left;  Superficial Femoral Artery   REVERSE SHOULDER ARTHROPLASTY Left 01/13/2023   Procedure: OPEN REDUCTION OF CHRONIC SHOULDER DISLOCATION, OPEN REDUCTION INTERNAL FIXATION OF TUBULAR FRACTURE, LEFT SHOULDER HEMIPLASTY, LATERJET AUGMENTATION;  Surgeon: Bjorn Pippin, MD;  Location: [facility] ORS;  Service: Orthopedics;  Laterality: Left;   ROBOT ASSISTED LAPAROSCOPIC NEPHRECTOMY Right 02/21/2014   Procedure: ROBOTIC ASSISTED LAPAROSCOPIC RIGHT NEPHRECTOMY ;  Surgeon: Sebastian Ache, MD;  Location: [facility] ORS;  Service: Urology;  Laterality: Right;   VIDEO ASSISTED THORACOSCOPY Left 05/18/2014   Procedure: LEFT VIDEO ASSISTED  THORACOSCOPY;  Surgeon: Loreli Slot, MD;  Location: [facility] OR;  Service: Thoracic;  Laterality: Left;   VIDEO BRONCHOSCOPY  05/18/2014   Procedure: VIDEO BRONCHOSCOPY;  Surgeon: Loreli Slot, MD;  Location: [facility] OR;  Service: Thoracic;;     reports that he has been smoking cigarettes. He started smoking about 50 years ago. He has a 50.8 pack-year smoking history. He has never been exposed to tobacco smoke. He has never used smokeless tobacco. He reports that he does not drink alcohol and does not use drugs.  Allergies  Allergen Reactions   Lisinopril Other (See Comments)    Other reaction(s): worsening kidney function   Pollen Extract     Other reaction(s): sneezing   Oxycodone Itching and Nausea Only    Pt is able to tolerate if taken with benadryl    Family History  Problem Relation Age of Onset   Cancer Mother        Thyroid - living in her 63's.   Hypertension Other    Diabetes Other    Stroke Other    Lung cancer Father    CAD Father    Cancer Maternal Grandmother        Breast  Cancer Maternal Grandfather        Throat and stomach   CAD Brother     Prior to Admission medications   Medication Sig Start Date End Date Taking? Authorizing Provider  albuterol (PROVENTIL) (2.5 MG/3ML) 0.083% nebulizer solution Take 3 mLs (2.5 mg total) by nebulization every 6 (six) hours as needed for wheezing or shortness of breath. 09/04/22   Cobb, Ruby Cola, NP  ALPRAZolam Prudy Feeler) 1 MG tablet Take 1 mg by mouth 3 (three) times daily. 01/07/21   [provider]  atorvastatin (LIPITOR) 20 MG tablet Take 20 mg by mouth at bedtime. 04/26/18   [provider]  benzonatate (TESSALON) 200 MG capsule TAKE ONE CAPSULE BY MOUTH THREE TIMES DAILY AS NEEDED FOR COUGH Patient taking differently: Take 200 mg by mouth 3 (three) times daily as needed for cough. 09/29/22   Kalman Shan, MD  budesonide (PULMICORT) 1 MG/2ML nebulizer solution Generic for Pulmicort. Inhale one  vial in nebulizer twice a day. Rinse mouth after use. 02/01/23   Cobb, Ruby Cola, NP  clopidogrel (PLAVIX) 75 MG tablet TAKE ONE TABLET BY MOUTH EVERY DAY 05/27/22   Jonelle Sidle, MD  diltiazem (CARDIZEM) 60 MG tablet Take 1 tablet (60 mg total) by mouth every 12 (twelve) hours. 01/19/23   Meredeth Ide, MD  diphenhydrAMINE (BENADRYL) 25 mg capsule Take 50 mg by mouth at bedtime.    [provider]  esomeprazole (NEXIUM) 40 MG capsule Take 40 mg by mouth daily.    [provider]  FLUoxetine (PROZAC) 40 MG capsule Take 40 mg by mouth daily.    [provider]  formoterol (PERFOROMIST) 20 MCG/2ML nebulizer solution Take 2 mLs (20 mcg total) by nebulization 2 (two) times daily. Patient not taking: Reported on 01/12/2023 09/16/22   Noemi Chapel, NP  gabapentin (NEURONTIN) 300 MG capsule Take 900 mg by mouth 3 (three) times daily.    [provider]  insulin regular human CONCENTRATED (HUMULIN R) 500 UNIT/ML injection Inject 90 Units into the skin 3 (three) times daily.    [provider]  isosorbide mononitrate (IMDUR) 30 MG 24 hr tablet Take 30 mg by mouth every evening.    [provider]  isosorbide mononitrate (IMDUR) 60 MG 24 hr tablet Take 1 tablet (60 mg total) by mouth in the morning. & 30 mg in the evening Patient taking differently: Take 60 mg by mouth in the morning. 03/27/22   Jonelle Sidle, MD  linagliptin (TRADJENTA) 5 MG TABS tablet Take 5 mg by mouth daily.    [provider]  midodrine (PROAMATINE) 2.5 MG tablet Take 1 tablet (2.5 mg total) by mouth 2 (two) times daily. 08/11/22   Jonelle Sidle, MD  oxyCODONE (ROXICODONE) 5 MG immediate release tablet Take 1 tablet (5 mg total) by mouth every 4 (four) hours as needed for severe pain. 01/16/23   Cecil Cobbs, PA-C  pregabalin (LYRICA) 25 MG capsule Take 25 mg by mouth 2 (two) times daily. 02/14/19   [provider]  ranolazine (RANEXA) 500 MG 12  hr tablet Take 1 tablet (500 mg total) by mouth 2 (two) times daily. 08/27/22   Sharlene Dory, NP  revefenacin [facility]) 175 MCG/3ML nebulizer solution Take 3 mLs (175 mcg total) by nebulization daily. Patient taking differently: Take 175 mcg by nebulization daily as needed (Wheezing/SOB). 09/16/22   Cobb, Ruby Cola, NP  tamsulosin (FLOMAX) 0.4 MG CAPS capsule Take 0.4 mg by mouth daily.  [provider]  zolpidem (AMBIEN) 5 MG tablet Take 5 mg by mouth at bedtime. 03/03/19   [provider]    Physical Exam: Vitals:   02/02/23 1630 02/02/23 1700 02/02/23 1715 02/02/23 1815  BP: (!) 161/67   (!) 142/69  Pulse: 86 83 77 76  Resp: (!) 27 (!) 23 (!) 22 18  Temp: (!) 97.4 F (36.3 C) (!) 97.5 F (36.4 C) (!) 97.4 F (36.3 C)   TempSrc:      SpO2: 98% 97% 96% 96%    Constitutional: Deeply asleep, hard to arouse, groans during exam, in response to vigorous touch but otherwise not answering questions Vitals:   02/02/23 1630 02/02/23 1700 02/02/23 1715 02/02/23 1815  BP: (!) 161/67   (!) 142/69  Pulse: 86 83 77 76  Resp: (!) 27 (!) 23 (!) 22 18  Temp: (!) 97.4 F (36.3 C) (!) 97.5 F (36.4 C) (!) 97.4 F (36.3 C)   TempSrc:      SpO2: 98% 97% 96% 96%   Eyes: Pupils equal, lids and conjunctivae normal ENMT: Mucous membranes are moist.   Neck: normal, supple, no masses, no thyromegaly Respiratory: On 4 L O2, sats 100%, during my evaluation, normal respiratory effort. No accessory muscle use.  Cardiovascular: Regular rate and rhythm, No extremity edema. 2+ pedal pulses. No carotid bruits.  Abdomen: Full, no tenderness, no masses palpated. No hepatosplenomegaly. Bowel sounds positive.  Musculoskeletal: no clubbing / cyanosis. No joint deformity upper and lower extremities.  Skin: no rashes, lesions, ulcers. No induration Neurologic: No facial asymmetry, somnolent Psychiatric: Unable to assess, somnolent.  Labs on Admission: I have personally reviewed following  labs and imaging studies  CBC: Recent Labs  Lab 02/02/23 1400  WBC 8.4  NEUTROABS 6.3  HGB 11.8*  HCT 36.7*  MCV 82.8  PLT 231   Basic Metabolic Panel: Recent Labs  Lab 02/02/23 1400  NA 136  K 3.0*  CL 109  CO2 18*  GLUCOSE 174*  BUN 20  CREATININE 1.66*  CALCIUM 7.8*   GFR: CrCl cannot be calculated (Unknown ideal weight.). Liver Function Tests: Recent Labs  Lab 02/02/23 1400  AST 12*  ALT 12  ALKPHOS 80  BILITOT 0.4  PROT 6.1*  ALBUMIN 2.9*   BNP (last 3 results) Recent Labs    09/25/22 1412  PROBNP 54.0   HbA1C: No results for input(s): "HGBA1C" in the last 72 hours. CBG: Recent Labs  Lab 02/02/23 1343  GLUCAP 165*   Urine analysis:    Component Value Date/Time   COLORURINE YELLOW 02/02/2023 1410   APPEARANCEUR CLEAR 02/02/2023 1410   LABSPEC 1.020 02/02/2023 1410   PHURINE 6.0 02/02/2023 1410   GLUCOSEU NEGATIVE 02/02/2023 1410   HGBUR NEGATIVE 02/02/2023 1410   BILIRUBINUR NEGATIVE 02/02/2023 1410   KETONESUR NEGATIVE 02/02/2023 1410   PROTEINUR >300 (A) 02/02/2023 1410   UROBILINOGEN 0.2 02/28/2015 1310   NITRITE NEGATIVE 02/02/2023 1410   LEUKOCYTESUR NEGATIVE 02/02/2023 1410    Radiological Exams on Admission: MR BRAIN WO CONTRAST  Result Date: 02/02/2023 CLINICAL DATA:  Mental status change, unknown cause EXAM: MRI HEAD WITHOUT CONTRAST TECHNIQUE: Multiplanar, multiecho pulse sequences of the brain and surrounding structures were obtained without intravenous contrast. COMPARISON:  Same day CT angiogram, CT head 12/27/2022. FINDINGS: Brain: Negative for an acute infarct. No hemorrhage. No hydrocephalus. No extra-axial fluid collection. There is a chronic right MCA territory infarct. There is sequela of mild overall chronic microvascular ischemic change. Vascular: Absent flow void  in the petrous segment of the left ICA (series 10, image 5). This is compatible with the occlusion seen on same day CT angiogram. Skull and upper cervical  spine: Normal marrow signal. Sinuses/Orbits: Moderate left and small right mastoid effusion. No middle ear effusion. Polypoid mucosal thickening left maxillary sinus. Orbits are unremarkable. Other: None. IMPRESSION: 1. No acute intracranial process. 2. Absent flow void in the petrous segment of the left ICA, compatible with occlusion seen on same day CT head and neck angiogram. 3. Chronic right MCA territory infarct. Electronically Signed   By: Lorenza Cambridge M.D.   On: 02/02/2023 18:29   CT Angio Chest PE W and/or Wo Contrast  Result Date: 02/02/2023 CLINICAL DATA:  Altered mental status, high clinical suspicion for PE EXAM: CT ANGIOGRAPHY CHEST WITH CONTRAST TECHNIQUE: Multidetector CT imaging of the chest was performed using the standard protocol during bolus administration of intravenous contrast. Multiplanar CT image reconstructions and MIPs were obtained to evaluate the vascular anatomy. RADIATION DOSE REDUCTION: This exam was performed according to the departmental dose-optimization program which includes automated exposure control, adjustment of the mA and/or kV according to patient size and/or use of iterative reconstruction technique. CONTRAST:  50mL OMNIPAQUE IOHEXOL 350 MG/ML SOLN COMPARISON:  Previous CT done on 05/24/2022 and chest radiograph done today FINDINGS: Cardiovascular: There is no demonstrable intimal flap in the thoracic aorta. Atherosclerotic plaques and calcifications are noted in thoracic aorta and its major branches. Significant atherosclerotic plaques are noted in the proximal course of left subclavian artery. There are no intraluminal filling defects in central pulmonary artery branches. Motion artifact and infiltrates limit evaluation of peripheral pulmonary artery branches. Mediastinum/Nodes: No significant lymphadenopathy is seen. Coarse calcifications are seen in right lobe thyroid. Lungs/Pleura: Breathing motion limits evaluation of lung fields. There are patchy infiltrates in  left lower lobe. There is decreased volume in the left lung. There is prominence of interstitial markings in right parahilar region and right lower lung field. There is no significant pleural effusion or pneumothorax. Upper Abdomen: There is fatty infiltration in liver. Small hiatal hernia is seen. Musculoskeletal: No acute findings are seen. Postsurgical changes are noted in the left shoulder. Review of the MIP images confirms the above findings. IMPRESSION: There is no evidence of central pulmonary artery embolism. Evaluation of peripheral pulmonary artery branches is limited by breathing motion and infiltrates. There is no evidence of thoracic aortic dissection. Aortic arteriosclerosis. Significant atherosclerotic changes are noted in the proximal course of left subclavian artery. Coronary artery calcifications are seen. Patchy infiltrates are seen in left lower lung field. There is subtle increase in interstitial markings in right lung. Findings suggest atelectasis/pneumonia and possibly interstitial edema or interstitial pneumonia. Evaluation of lung fields is limited by motion artifacts. Fatty liver.  Small hiatal hernia. Electronically Signed   By: Ernie Avena M.D.   On: 02/02/2023 17:17   CT ANGIO HEAD NECK W WO CM  Result Date: 02/02/2023 CLINICAL DATA:  Stroke/TIA, determine embolic source EXAM: CT ANGIOGRAPHY HEAD AND NECK WITH AND WITHOUT CONTRAST TECHNIQUE: Multidetector CT imaging of the head and neck was performed using the standard protocol during bolus administration of intravenous contrast. Multiplanar CT image reconstructions and MIPs were obtained to evaluate the vascular anatomy. Carotid stenosis measurements (when applicable) are obtained utilizing NASCET criteria, using the distal internal carotid diameter as the denominator. RADIATION DOSE REDUCTION: This exam was performed according to the departmental dose-optimization program which includes automated exposure control,  adjustment of the mA and/or kV according to  patient size and/or use of iterative reconstruction technique. CONTRAST:  75mL OMNIPAQUE IOHEXOL 350 MG/ML SOLN COMPARISON:  None Available. FINDINGS: CT HEAD FINDINGS Brain: Right frontal operculum encephalomalacia. No evidence of acute large vascular territory infarct, acute hemorrhage, mass lesion or midline shift. Vascular: See below. Skull: No acute fracture. Sinuses/Orbits: Clear sinuses.  No acute orbital findings. Other: No mastoid effusions. Review of the MIP images confirms the above findings CTA NECK FINDINGS Aortic arch: Great vessel origins are patent without significant stenosis. Calcific atherosclerosis. Right carotid system: Severe ulcerated atherosclerosis of the right carotid bifurcation and proximal ICA with approximately 60% stenosis, somewhat underestimated due to small caliber of the distal ICA. Left carotid system: Occlusion of the left ICA proximally. Non opacification of the remainder of the assay in the neck. Vertebral arteries: Right dominant. 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 Skeleton: No acute abnormality on limited assessment. Other neck: No acute abnormality on limited assessment. 1.2 cm right thyroid nodule shows not require further imaging follow-up (ref: J Am Coll Radiol. 2015 Feb;12(2): 143-50). Upper chest: Emphysema.  Aortic atherosclerosis. Review of the MIP images confirms the above findings CTA HEAD FINDINGS Anterior circulation: Age indeterminate occlusion of the left proximal ICA in the neck with irregular reconstitution at the intracranial cavernous segment and opacified MCAs and ACAs. Right intracranial ICA, MCA and ACA are patent without proximal high-grade stenosis. Posterior circulation: Bilateral intradural vertebral arteries, basilar artery and bilateral posterior cerebral arteries are patent without proximal hemodynamically significant stenosis. Venous sinuses: As  permitted by contrast timing, patent. Review of the MIP images confirms the above findings IMPRESSION: 1. Age indeterminate occlusion of the left proximal ICA in the neck with irregular reconstitution at the intracranial cavernous segment and opacified MCAs and ACAs. 2. 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 Severe ulcerated atherosclerosis of the right carotid bifurcation and proximal ICA with approximately 60% stenosis, somewhat underestimated due to small caliber of the distal ICA. 4. Aortic Atherosclerosis (ICD10-I70.0) and Emphysema (ICD10-J43.9). Findings discussed with Dr. Estell Harpin via telephone at 3:40 p.m. Electronically Signed   By: Feliberto Harts M.D.   On: 02/02/2023 15:41   DG Chest Port 1 View  Result Date: 02/02/2023 CLINICAL DATA:  Shortness of breath and chest pain.  Unresponsive. EXAM: PORTABLE CHEST 1 VIEW COMPARISON:  01/16/2023 FINDINGS: Defibrillator overlies the chest. Cardiomegaly and aortic atherosclerosis as seen previously. Interstitial and early alveolar pulmonary edema. Chronic pleural and parenchymal scarring on the left. IMPRESSION: Interstitial and early alveolar pulmonary edema. Cardiomegaly. Aortic atherosclerosis. Defibrillator. Electronically Signed   By: Paulina Fusi M.D.   On: 02/02/2023 15:39    EKG: Independently reviewed.  Sinus.  Rate 93, QTc 497.  LAFB.  Atrial premature complexes.   Assessment/Plan Principal Problem:   Acute metabolic encephalopathy Active Problems:   Chest pain   Chronic respiratory failure with hypoxia ([name])   Hypokalemia   Coronary artery disease involving native coronary artery of native heart without angina pectoris   COPD (chronic obstructive pulmonary disease) ([name])   ILD (interstitial lung disease) ([name])   Essential hypertension   Obstructive sleep apnea   CKD (chronic kidney disease) stage 3, GFR 30-59 ml/min ([name])   Diabetes mellitus due to underlying condition,  uncontrolled, with hyperglycemia, with long-term current use of insulin ([name])   Assessment and Plan: * Acute metabolic encephalopathy Currently somnolent.  Became unresponsive after taking 2 of nitro.  Hypotensive in the 60s.  Not  back to baseline.  Some response to 2 mg of Narcan x 2.  UDS positive for benzos- Likely etiology of altered mental status, possible pneumonia, dehydration.  Possible hypotension from nitro. -Remain n.p.o. - Hydrate - CBg Q6h - MRI brain no acute intracranial process, Absent flow void in the petrous segment of the left ICA,compatible with occlusion seen on same day CT head and neck angiogram. 3. Chronic right MCA territory infarct. -CT head and neck age-indeterminate occlusion of left proximal ICA with irregular reconstitution at the intracranial cavernous segment.  See detailed report.  Also with severe ulcerated atherosclerosis of right carotid bifurcation and proximal ICA. - EDP spoke to Dr. Selina Cooley, patient did not need any type of interventional procedure. -Hold psychoactive home meds-Xanax, Lyrica, Prozac  Hypokalemia Potassium 3.  Magnesium 1.3. -Replete  Chest pain CAD s/p DES to LAD 03/09/2013, PAD s/p L SFA stenting 06/2021.  Initial complaints of chest pain.  EKG without significant ST or T wave changes, troponin 9> 10.  Recent echo 12/2022 EF 55 to 60%, grade 1 DD. -Reassess in a.m. when patient mental status improved -Hold relaxing, Plavix, Imdur, while n.p.o.  PNA (pneumonia) Presents with altered mental status.  Afebrile without leukocytosis.  Rules out for sepsis.  Lactic acid 0.8.  CTA chest-possible atelectasis/pneumonia, possible interstitial edema or interstitial pneumonia.  BNP elevated at 174 from baseline 70s to 80s.  No peripheral signs of volume overload.  No increased O2 requirements.  Sats currently 100% on 4 L.  He is on 6 L chronically. -Will treat for now as pneumonia, -Check procalcitonin -IV ceftriaxone and azithromycin,  pending  clinical course, may discontinue antibiotics  Chronic respiratory failure with hypoxia ([name]) On 4 L during my examination sats 100%.  Diabetes mellitus due to underlying condition, uncontrolled, with hyperglycemia, with long-term current use of insulin ([name]) - SSI- S q6h -Hold home insulins, Tradjenta  Acute kidney injury superimposed on CKD ([name]) Creatinine 1.6, baseline 1-1.2. -2 L bolus given, continue N/s + 40kcl 75cc/hr x 12hrs  Essential hypertension Stable. -Hold Imdur, diltiazem, tamsulosin while n.p.o. -As needed labetalol 10 mg for systolic greater than 170  COPD (chronic obstructive pulmonary disease) ([name]) Stable.    DVT prophylaxis: Heparin Code Status: DNR- DNR form on file.  Consistent with recent prior documentation Family Communication: No family at bedside. Disposition Plan: ~ 2 days Consults called: None Admission status:  Obs tele    Author: Onnie Boer, MD 02/02/2023 10:16 PM  For on call review [email or web address]

## 2023-02-03 DIAGNOSIS — I10 Essential (primary) hypertension: Secondary | ICD-10-CM

## 2023-02-03 DIAGNOSIS — Z794 Long term (current) use of insulin: Secondary | ICD-10-CM | POA: Diagnosis not present

## 2023-02-03 DIAGNOSIS — J849 Interstitial pulmonary disease, unspecified: Secondary | ICD-10-CM | POA: Diagnosis not present

## 2023-02-03 DIAGNOSIS — J9611 Chronic respiratory failure with hypoxia: Secondary | ICD-10-CM | POA: Diagnosis not present

## 2023-02-03 DIAGNOSIS — G4733 Obstructive sleep apnea (adult) (pediatric): Secondary | ICD-10-CM

## 2023-02-03 DIAGNOSIS — G9341 Metabolic encephalopathy: Secondary | ICD-10-CM | POA: Diagnosis not present

## 2023-02-03 DIAGNOSIS — E0865 Diabetes mellitus due to underlying condition with hyperglycemia: Secondary | ICD-10-CM | POA: Diagnosis not present

## 2023-02-03 DIAGNOSIS — R079 Chest pain, unspecified: Secondary | ICD-10-CM | POA: Diagnosis not present

## 2023-02-03 LAB — CBC
HCT: 38.6 % — ABNORMAL LOW (ref 39.0–52.0)
Hemoglobin: 12.4 g/dL — ABNORMAL LOW (ref 13.0–17.0)
MCH: 26.8 pg (ref 26.0–34.0)
MCHC: 32.1 g/dL (ref 30.0–36.0)
MCV: 83.5 fL (ref 80.0–100.0)
Platelets: 220 10*3/uL (ref 150–400)
RBC: 4.62 MIL/uL (ref 4.22–5.81)
RDW: 14.1 % (ref 11.5–15.5)
WBC: 6.6 10*3/uL (ref 4.0–10.5)
nRBC: 0 % (ref 0.0–0.2)

## 2023-02-03 LAB — BASIC METABOLIC PANEL
Anion gap: 7 (ref 5–15)
BUN: 17 mg/dL (ref 8–23)
CO2: 21 mmol/L — ABNORMAL LOW (ref 22–32)
Calcium: 8.1 mg/dL — ABNORMAL LOW (ref 8.9–10.3)
Chloride: 111 mmol/L (ref 98–111)
Creatinine, Ser: 1.31 mg/dL — ABNORMAL HIGH (ref 0.61–1.24)
GFR, Estimated: 60 mL/min — ABNORMAL LOW (ref >60)
Glucose, Bld: 113 mg/dL — ABNORMAL HIGH (ref 70–99)
Potassium: 3.7 mmol/L (ref 3.5–5.1)
Sodium: 139 mmol/L (ref 135–145)

## 2023-02-03 LAB — GLUCOSE, CAPILLARY
Glucose-Capillary: 118 mg/dL — ABNORMAL HIGH (ref 70–99)
Glucose-Capillary: 102 mg/dL — ABNORMAL HIGH (ref 70–99)

## 2023-02-03 LAB — MAGNESIUM: Magnesium: 2 mg/dL (ref 1.7–2.4)

## 2023-02-03 NOTE — Discharge Summary (Signed)
Physician Discharge Summary   Patient: [name] MRN: [id number] DOB: [birth year]  Admit date:     02/02/2023  Discharge date: 02/03/23  Discharge Physician: Vassie Loll   PCP: Genia Hotter, FNP   Recommendations at discharge:  Repeat basic metabolic panel to follow electrolytes and renal function Reassess blood pressure and further adjust antihypertensive treatment as needed Continue to closely follow patient's CBGs/A1c with further adjustment to hypoglycemic regimen as needed.  Discharge Diagnoses: Principal Problem:   Acute metabolic encephalopathy Active Problems:   PNA (pneumonia)   Chest pain   Hypokalemia   Chronic respiratory failure with hypoxia ([name])   Coronary artery disease involving native coronary artery of native heart without angina pectoris   COPD (chronic obstructive pulmonary disease) ([name])   ILD (interstitial lung disease) ([name])   Essential hypertension   Obstructive sleep apnea   Acute kidney injury superimposed on CKD ([name])   Diabetes mellitus due to underlying condition, uncontrolled, with hyperglycemia, with long-term current use of insulin [facility])  Brief Hospital admission narrative: As per H&P written by Dr. Mariea Clonts on 02/02/2023 [name] is a 67 y.o. male with medical history significant for diabetes, diastolic CHF, coronary artery disease, substance use, stroke, COPD, CAD, chronic respiratory failure. Patient was brought to the ED via EMS with initial complaints of chest pain.  Patient took 2 nitroglycerin and walked to stretcher, and became unresponsive.  He was also hypotensive with systolic in the 60s.  At the time of my evaluation, patient is deeply asleep, hard to arouse, groans during my exam otherwise eyes are closed, moves upper extremities to some extent.   Recent hospitalization 6/25 to 7/2-for displaced left humeral fracture with dislocation, also with AKI on CKD 3B, acute urinary retention, hypotension.  Underwent surgery -6/26  by Dr. Everardo Pacific.   Assessment and Plan: * Acute metabolic encephalopathy -Associated with medications -Stabilized and improved -Discharged home in a stable condition and with mentation back to baseline -Outpatient follow-up with PCP in 10 days recommended -Patient instructed to maintain adequate nutrition and hydration and to be mindful into how to take his medications spread out from each other. -CT head demonstrating no acute intracranial normalities.  Hypokalemia -Electrolytes have been repleted -Continue to follow trend. -Patient advised to maintain adequate hydration and resume daily supplementation  Chest pain -CAD s/p DES to LAD 03/09/2013, PAD s/p L SFA stenting 06/2021.  -Chest pain-free at time of discharge; most likely musculoskeletal/referral pain from left shoulder recent injury. -No acute ischemic changes appreciated on telemetry or EKG -Negative troponin -Resume home cardiac medication regimen including the use of Ranexa and Imdur.   -Continue patient follow-up with cardiology service.   PNA (pneumonia) -Presents with altered mental status associated with transient hypotension after using nitroglycerin along with antihypertensive agents and analgesics. -CT angiogram demonstrated no pulmonary embolism and suggested the presence of atelectasis versus pneumonia; negative procalcitonin, no complaints of fever, normal WBCs, no productive coughing spells or worsening shortness of breath from baseline. -Patient remained asymptomatic from infection standpoint and pneumonia has been ruled out -Use of flutter valve and resumption of bronchodilator management along with mucolytic's has been recommended -Patient will continue home oxygen supplementation and outpatient follow-up with PCP.  Chronic respiratory failure with hypoxia ([name]) -Appears to be stable and at baseline Continue home chronic supplementation.  Diabetes mellitus due to underlying condition, uncontrolled, with  hyperglycemia, with long-term current use of insulin ([name]) Resume home insulin therapy and the use of Tradjenta -Modified carbohydrate diet discussed with  patient.  Acute kidney injury superimposed on CKD ([name]) -Renal function improving back to baseline at time of discharge -Advised to maintain adequate hydration -Continue to minimize/avoid the use of contrast and hypotension.  Obstructive sleep apnea -Continue the use of CPAP nightly.  Essential hypertension -Resume home antihypertensive agent -Heart healthy diet discussed with patient.  ILD (interstitial lung disease) ([name]) -Stable -Continue patient follow-up with pulmonology service.  COPD (chronic obstructive pulmonary disease) ([name]) -Stable, without signs of acute exacerbation -Resume home bronchodilator management..  Chronic diastolic heart failure -Compensated -Daily weights, low-sodium diet and adequate hydration has been encouraged -Patient to resume home diuretic regimen.  Consultants: Neurology service was consulted by EDP at time of admission. Procedures performed: See below Disposition: Home Diet recommendation: Heart healthy modified carbohydrate diet.  DISCHARGE MEDICATION: Allergies as of 02/03/2023       Reactions   Lisinopril Other (See Comments)   Other reaction(s): worsening kidney function   Pollen Extract    Other reaction(s): sneezing   Oxycodone Itching, Nausea Only   Pt is able to tolerate if taken with benadryl        Medication List     STOP taking these medications    benzonatate 200 MG capsule Commonly known as: TESSALON   diltiazem 60 MG tablet Commonly known as: CARDIZEM   oxyCODONE 5 MG immediate release tablet Commonly known as: Roxicodone   tamsulosin 0.4 MG Caps capsule Commonly known as: FLOMAX   zolpidem 5 MG tablet Commonly known as: AMBIEN       TAKE these medications    albuterol (2.5 MG/3ML) 0.083% nebulizer solution Commonly known as: PROVENTIL Take 3  mLs (2.5 mg total) by nebulization every 6 (six) hours as needed for wheezing or shortness of breath.   ALPRAZolam 1 MG tablet Commonly known as: XANAX Take 1 mg by mouth 3 (three) times daily.   atorvastatin 20 MG tablet Commonly known as: LIPITOR Take 20 mg by mouth at bedtime.   budesonide 1 MG/2ML nebulizer solution Commonly known as: PULMICORT Generic for Pulmicort. Inhale one vial in nebulizer twice a day. Rinse mouth after use.   clopidogrel 75 MG tablet Commonly known as: PLAVIX TAKE ONE TABLET BY MOUTH EVERY DAY   diltiazem 180 MG 24 hr capsule Commonly known as: CARDIZEM CD Take 180 mg by mouth daily.   diphenhydrAMINE 25 mg capsule Commonly known as: BENADRYL Take 50 mg by mouth at bedtime.   esomeprazole 40 MG capsule Commonly known as: NEXIUM Take 40 mg by mouth 2 (two) times daily before a meal.   FLUoxetine 40 MG capsule Commonly known as: PROZAC Take 40 mg by mouth daily.   formoterol 20 MCG/2ML nebulizer solution Commonly known as: Perforomist Take 2 mLs (20 mcg total) by nebulization 2 (two) times daily.   gabapentin 300 MG capsule Commonly known as: NEURONTIN Take 900 mg by mouth 3 (three) times daily.   insulin regular human CONCENTRATED 500 UNIT/ML injection Commonly known as: HUMULIN R Inject 90 Units into the skin 3 (three) times daily.   isosorbide mononitrate 60 MG 24 hr tablet Commonly known as: IMDUR Take 1 tablet (60 mg total) by mouth in the morning. & 30 mg in the evening What changed:  additional instructions Another medication with the same name was removed. Continue taking this medication, and follow the directions you see here.   linagliptin 5 MG Tabs tablet Commonly known as: TRADJENTA Take 5 mg by mouth daily.   midodrine 2.5 MG tablet Commonly known as:  PROAMATINE Take 1 tablet (2.5 mg total) by mouth 2 (two) times daily.   nitroGLYCERIN 0.4 MG SL tablet Commonly known as: NITROSTAT Place 0.4 mg under the tongue every  5 (five) minutes as needed for chest pain.   oxyCODONE-acetaminophen 10-325 MG tablet Commonly known as: PERCOCET Take 1 tablet by mouth every 6 (six) hours as needed.   potassium chloride SA 20 MEQ tablet Commonly known as: KLOR-CON M Take 20 mEq by mouth 2 (two) times daily.   pregabalin 25 MG capsule Commonly known as: LYRICA Take 25 mg by mouth 2 (two) times daily.   ranolazine 500 MG 12 hr tablet Commonly known as: RANEXA Take 1 tablet (500 mg total) by mouth 2 (two) times daily.   revefenacin 175 MCG/3ML nebulizer solution Commonly known as: YUPELRI Take 3 mLs (175 mcg total) by nebulization daily. What changed:  when to take this reasons to take this   torsemide 20 MG tablet Commonly known as: DEMADEX Take 60 mg by mouth 2 (two) times daily.        Follow-up Information     [name], [name], FNP. Schedule an appointment as soon as possible for a visit in 10 day(s).   Specialty: Family Medicine Contact information: [address] [place] [address] [phone]                Discharge Exam: Ceasar Mons Weights   02/02/23 2220 02/03/23 0522  Weight: 91.7 kg 91.7 kg   General exam: Alert, awake, oriented x 3; no chest pain, no nausea or vomiting.  Reported intermittent left shoulder discomfort for with movement. Respiratory system: Good air movement bilaterally.  Good saturation on chronic supplementation.  No using accessory muscle. Cardiovascular system: Rate controlled; no rubs or gallops. Gastrointestinal system: Abdomen is nondistended, soft and nontender. No organomegaly or masses felt. Normal bowel sounds heard. Central nervous system:No focal neurological deficits. Extremities: No cyanosis or clubbing. Skin: No petechiae. Psychiatry: Judgement and insight appear stable; flat affect appreciated.  Condition at discharge: Stable and improved.  The results of significant diagnostics from this hospitalization (including imaging,  microbiology, ancillary and laboratory) are listed below for reference.   Imaging Studies: MR BRAIN WO CONTRAST  Result Date: 02/02/2023 CLINICAL DATA:  Mental status change, unknown cause EXAM: MRI HEAD WITHOUT CONTRAST TECHNIQUE: Multiplanar, multiecho pulse sequences of the brain and surrounding structures were obtained without intravenous contrast. COMPARISON:  Same day CT angiogram, CT head 12/27/2022. FINDINGS: Brain: Negative for an acute infarct. No hemorrhage. No hydrocephalus. No extra-axial fluid collection. There is a chronic right MCA territory infarct. There is sequela of mild overall chronic microvascular ischemic change. Vascular: Absent flow void in the petrous segment of the left ICA (series 10, image 5). This is compatible with the occlusion seen on same day CT angiogram. Skull and upper cervical spine: Normal marrow signal. Sinuses/Orbits: Moderate left and small right mastoid effusion. No middle ear effusion. Polypoid mucosal thickening left maxillary sinus. Orbits are unremarkable. Other: None. IMPRESSION: 1. No acute intracranial process. 2. Absent flow void in the petrous segment of the left ICA, compatible with occlusion seen on same day CT head and neck angiogram. 3. Chronic right MCA territory infarct. Electronically Signed   By: Lorenza Cambridge M.D.   On: 02/02/2023 18:29   CT Angio Chest PE W and/or Wo Contrast  Result Date: 02/02/2023 CLINICAL DATA:  Altered mental status, high clinical suspicion for PE EXAM: CT ANGIOGRAPHY CHEST WITH CONTRAST TECHNIQUE: Multidetector CT imaging of  the chest was performed using the standard protocol during bolus administration of intravenous contrast. Multiplanar CT image reconstructions and MIPs were obtained to evaluate the vascular anatomy. RADIATION DOSE REDUCTION: This exam was performed according to the departmental dose-optimization program which includes automated exposure control, adjustment of the mA and/or kV according to patient size  and/or use of iterative reconstruction technique. CONTRAST:  50mL OMNIPAQUE IOHEXOL 350 MG/ML SOLN COMPARISON:  Previous CT done on 05/24/2022 and chest radiograph done today FINDINGS: Cardiovascular: There is no demonstrable intimal flap in the thoracic aorta. Atherosclerotic plaques and calcifications are noted in thoracic aorta and its major branches. Significant atherosclerotic plaques are noted in the proximal course of left subclavian artery. There are no intraluminal filling defects in central pulmonary artery branches. Motion artifact and infiltrates limit evaluation of peripheral pulmonary artery branches. Mediastinum/Nodes: No significant lymphadenopathy is seen. Coarse calcifications are seen in right lobe thyroid. Lungs/Pleura: Breathing motion limits evaluation of lung fields. There are patchy infiltrates in left lower lobe. There is decreased volume in the left lung. There is prominence of interstitial markings in right parahilar region and right lower lung field. There is no significant pleural effusion or pneumothorax. Upper Abdomen: There is fatty infiltration in liver. Small hiatal hernia is seen. Musculoskeletal: No acute findings are seen. Postsurgical changes are noted in the left shoulder. Review of the MIP images confirms the above findings. IMPRESSION: There is no evidence of central pulmonary artery embolism. Evaluation of peripheral pulmonary artery branches is limited by breathing motion and infiltrates. There is no evidence of thoracic aortic dissection. Aortic arteriosclerosis. Significant atherosclerotic changes are noted in the proximal course of left subclavian artery. Coronary artery calcifications are seen. Patchy infiltrates are seen in left lower lung field. There is subtle increase in interstitial markings in right lung. Findings suggest atelectasis/pneumonia and possibly interstitial edema or interstitial pneumonia. Evaluation of lung fields is limited by motion artifacts. Fatty  liver.  Small hiatal hernia. Electronically Signed   By: Ernie Avena M.D.   On: 02/02/2023 17:17   CT ANGIO HEAD NECK W WO CM  Result Date: 02/02/2023 CLINICAL DATA:  Stroke/TIA, determine embolic source EXAM: CT ANGIOGRAPHY HEAD AND NECK WITH AND WITHOUT CONTRAST TECHNIQUE: Multidetector CT imaging of the head and neck was performed using the standard protocol during bolus administration of intravenous contrast. Multiplanar CT image reconstructions and MIPs were obtained to evaluate the vascular anatomy. Carotid stenosis measurements (when applicable) are obtained utilizing NASCET criteria, using the distal internal carotid diameter as the denominator. RADIATION DOSE REDUCTION: This exam was performed according to the departmental dose-optimization program which includes automated exposure control, adjustment of the mA and/or kV according to patient size and/or use of iterative reconstruction technique. CONTRAST:  75mL OMNIPAQUE IOHEXOL 350 MG/ML SOLN COMPARISON:  None Available. FINDINGS: CT HEAD FINDINGS Brain: Right frontal operculum encephalomalacia. No evidence of acute large vascular territory infarct, acute hemorrhage, mass lesion or midline shift. Vascular: See below. Skull: No acute fracture. Sinuses/Orbits: Clear sinuses.  No acute orbital findings. Other: No mastoid effusions. Review of the MIP images confirms the above findings CTA NECK FINDINGS Aortic arch: Great vessel origins are patent without significant stenosis. Calcific atherosclerosis. Right carotid system: Severe ulcerated atherosclerosis of the right carotid bifurcation and proximal ICA with approximately 60% stenosis, somewhat underestimated due to small caliber of the distal ICA. Left carotid system: Occlusion of the left ICA proximally. Non opacification of the remainder of the assay in the neck. Vertebral arteries: Right dominant. Occlusion of the  proximal left vertebral artery with irregular distal left V2 reconstitution.  Skeleton: No acute abnormality on limited assessment. Other neck: No acute abnormality on limited assessment. 1.2 cm right thyroid nodule shows not require further imaging follow-up (ref: J Am Coll Radiol. 2015 Feb;12(2): 143-50). Upper chest: Emphysema.  Aortic atherosclerosis. Review of the MIP images confirms the above findings CTA HEAD FINDINGS Anterior circulation: Age indeterminate occlusion of the left proximal ICA in the neck with irregular reconstitution at the intracranial cavernous segment and opacified MCAs and ACAs. Right intracranial ICA, MCA and ACA are patent without proximal high-grade stenosis. Posterior circulation: Bilateral intradural vertebral arteries, basilar artery and bilateral posterior cerebral arteries are patent without proximal hemodynamically significant stenosis. Venous sinuses: As permitted by contrast timing, patent. Review of the MIP images confirms the above findings IMPRESSION: 1. Age indeterminate occlusion of the left proximal ICA in the neck with irregular reconstitution at the intracranial cavernous segment and opacified MCAs and ACAs. 2. 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 Severe ulcerated atherosclerosis of the right carotid bifurcation and proximal ICA with approximately 60% stenosis, somewhat underestimated due to small caliber of the distal ICA. 4. Aortic Atherosclerosis (ICD10-I70.0) and Emphysema (ICD10-J43.9). Findings discussed with Dr. Estell Harpin via telephone at 3:40 p.m. Electronically Signed   By: Feliberto Harts M.D.   On: 02/02/2023 15:41   DG Chest Port 1 View  Result Date: 02/02/2023 CLINICAL DATA:  Shortness of breath and chest pain.  Unresponsive. EXAM: PORTABLE CHEST 1 VIEW COMPARISON:  01/16/2023 FINDINGS: Defibrillator overlies the chest. Cardiomegaly and aortic atherosclerosis as seen previously. Interstitial and early alveolar pulmonary edema. Chronic pleural and parenchymal scarring on the  left. IMPRESSION: Interstitial and early alveolar pulmonary edema. Cardiomegaly. Aortic atherosclerosis. Defibrillator. Electronically Signed   By: Paulina Fusi M.D.   On: 02/02/2023 15:39   DG CHEST PORT 1 VIEW  Result Date: 01/16/2023 CLINICAL DATA:  Left shoulder surgery several days ago with difficulty breathing, initial encounter EXAM: PORTABLE CHEST 1 VIEW COMPARISON:  01/12/2023 FINDINGS: Cardiac shadow is stable. Aortic calcifications are seen. Right lung is within normal limits. Mild pleural thickening is noted on the left likely related to scarring and stable from the prior study. No acute bony abnormality is noted. Prior left shoulder replacement is seen. IMPRESSION: Chronic pleural thickening and scarring on the left. No acute abnormality is noted. Electronically Signed   By: Alcide Clever M.D.   On: 01/16/2023 20:02   US RENAL  Result Date: 01/16/2023 CLINICAL DATA:  AKI, status post right nephrectomy EXAM: RENAL / URINARY TRACT ULTRASOUND COMPLETE COMPARISON:  CT abdomen and pelvis with contrast October 23, 2021 FINDINGS: Right Kidney: Surgically absent. Left Kidney: Renal measurements: 13.9 x 7.3 x 7.8 cm = volume: 416.5 mL. Echogenicity within normal limits. No hydronephrosis. There is a 1.3 x 1.1 x 1.1 simple cyst in the middle pole of the left kidney. Bladder: There is focal thickening of the anterior bladder wall versus a bladder wall mass, measuring up to 4.5 x 1.2 x 3.8 cm. This corresponds with the area of focal bladder wall thickening seen on recent CT from October 23, 2021. Other: None. IMPRESSION: Anterior bladder wall thickening versus bladder wall mass that measures up to 4.5 cm. Recommend correlation with direct visualization and tissue sampling to exclude malignancy. Electronically Signed   By: Jacob Moores M.D.   On: 01/16/2023 12:19   DG Shoulder Left Port  Result Date: 01/13/2023 CLINICAL DATA:  Postop left shoulder  replacement EXAM: LEFT SHOULDER COMPARISON:  12/27/2022  FINDINGS: Single portable frontal postop view demonstrates left shoulder arthroplasty changes. Shoulder alignment appears anatomic in the frontal projection. AC joint also aligned. Displaced greater tuberosity fracture again noted. IMPRESSION: Expected postoperative appearance status post left shoulder arthroplasty. Electronically Signed   By: Judie Petit.  Shick M.D.   On: 01/13/2023 16:41   ECHOCARDIOGRAM COMPLETE  Result Date: 01/13/2023    ECHOCARDIOGRAM REPORT   Patient Name:   THEO KRUMHOLZ [facility] Date of Exam: 01/13/2023 Medical Rec #:  [id number]      Height:       69.0 in Accession #:    [id number]     Weight:       205.9 lb Date of Birth:  Jul 30, 1955     BSA:          2.092 m Patient Age:    66 years       BP:           166/84 mmHg Patient Gender: M              HR:           75 bpm. Exam Location:  Inpatient Procedure: 2D Echo, Cardiac Doppler and Color Doppler Indications:    Preop; CHF - Acute Diastolic  History:        Patient has prior history of Echocardiogram examinations, most                 recent 12/06/2020. CHF, CAD, COPD, Carotid Disease, ILD, hx                 cancer and Stroke, Signs/Symptoms:chronic respiratory failure;                 Risk Factors:Hypertension, Diabetes, Dyslipidemia, substance                 abuse, Sleep Apnea and Current Smoker.  Sonographer:    Wallie Char Referring Phys: [id number] MIR M [facility]  Sonographer Comments: Image acquisition challenging due to COPD. IMPRESSIONS  1. Left ventricular ejection fraction, by estimation, is 55 to 60%. The left ventricle has normal function. Left ventricular endocardial border not optimally defined to evaluate regional wall motion. There is mild concentric left ventricular hypertrophy. Left ventricular diastolic parameters are consistent with Grade I diastolic dysfunction (impaired relaxation).  2. Right ventricular systolic function is normal. The right ventricular size is normal. There is normal pulmonary artery systolic pressure. The  estimated right ventricular systolic pressure is 33.5 mmHg.  3. Left atrial size was mildly dilated.  4. The mitral valve is grossly normal. Trivial mitral valve regurgitation. No evidence of mitral stenosis.  5. The aortic valve is tricuspid. There is moderate calcification of the aortic valve. There is moderate thickening of the aortic valve. Aortic valve regurgitation is not visualized. Aortic valve sclerosis/calcification is present, without any evidence of aortic stenosis.  6. The inferior vena cava is normal in size with greater than 50% respiratory variability, suggesting right atrial pressure of 3 mmHg. Comparison(s): No significant change from prior study. FINDINGS  Left Ventricle: Left ventricular ejection fraction, by estimation, is 55 to 60%. The left ventricle has normal function. Left ventricular endocardial border not optimally defined to evaluate regional wall motion. The left ventricular internal cavity size was normal in size. There is mild concentric left ventricular hypertrophy. Left ventricular diastolic parameters are consistent with Grade I diastolic dysfunction (impaired relaxation). Right Ventricle: The right ventricular size is normal. No increase in  right ventricular wall thickness. Right ventricular systolic function is normal. There is normal pulmonary artery systolic pressure. The tricuspid regurgitant velocity is 2.76 m/s, and  with an assumed right atrial pressure of 3 mmHg, the estimated right ventricular systolic pressure is 33.5 mmHg. Left Atrium: Left atrial size was mildly dilated. Right Atrium: Right atrial size was normal in size. Pericardium: There is no evidence of pericardial effusion. Presence of epicardial fat layer. Mitral Valve: The mitral valve is grossly normal. Trivial mitral valve regurgitation. No evidence of mitral valve stenosis. MV peak gradient, 2.3 mmHg. The mean mitral valve gradient is 1.0 mmHg. Tricuspid Valve: The tricuspid valve is grossly normal.  Tricuspid valve regurgitation is trivial. No evidence of tricuspid stenosis. Aortic Valve: The aortic valve is tricuspid. There is moderate calcification of the aortic valve. There is moderate thickening of the aortic valve. Aortic valve regurgitation is not visualized. Aortic valve sclerosis/calcification is present, without any  evidence of aortic stenosis. Aortic valve mean gradient measures 5.0 mmHg. Aortic valve peak gradient measures 9.5 mmHg. Aortic valve area, by VTI measures 2.48 cm. Pulmonic Valve: The pulmonic valve was grossly normal. Pulmonic valve regurgitation is not visualized. No evidence of pulmonic stenosis. Aorta: The aortic root and ascending aorta are structurally normal, with no evidence of dilitation. Venous: The inferior vena cava is normal in size with greater than 50% respiratory variability, suggesting right atrial pressure of 3 mmHg. IAS/Shunts: The atrial septum is grossly normal.  LEFT VENTRICLE PLAX 2D LVIDd:         5.10 cm      Diastology LVIDs:         3.90 cm      LV e' medial:    4.56 cm/s LV PW:         1.30 cm      LV E/e' medial:  11.6 LV IVS:        1.10 cm      LV e' lateral:   5.18 cm/s LVOT diam:     2.20 cm      LV E/e' lateral: 10.3 LV SV:         74 LV SV Index:   35 LVOT Area:     3.80 cm  LV Volumes (MOD) LV vol d, MOD A2C: 113.0 ml LV vol d, MOD A4C: 102.0 ml LV vol s, MOD A2C: 53.3 ml LV vol s, MOD A4C: 49.4 ml LV SV MOD A2C:     59.7 ml LV SV MOD A4C:     102.0 ml LV SV MOD BP:      55.7 ml RIGHT VENTRICLE             IVC RV Basal diam:  3.90 cm     IVC diam: 1.80 cm RV S prime:     13.30 cm/s TAPSE (M-mode): 1.8 cm LEFT ATRIUM             Index        RIGHT ATRIUM           Index LA diam:        3.70 cm 1.77 cm/m   RA Area:     19.60 cm LA Vol (A2C):   76.6 ml 36.62 ml/m  RA Volume:   51.30 ml  24.53 ml/m LA Vol (A4C):   68.2 ml 32.60 ml/m LA Biplane Vol: 74.3 ml 35.52 ml/m  AORTIC VALVE AV Area (Vmax):    2.38 cm AV Area (Vmean):   2.35 cm AV  Area  (VTI):     2.48 cm AV Vmax:           154.00 cm/s AV Vmean:          104.500 cm/s AV VTI:            0.298 m AV Peak Grad:      9.5 mmHg AV Mean Grad:      5.0 mmHg LVOT Vmax:         96.60 cm/s LVOT Vmean:        64.650 cm/s LVOT VTI:          0.194 m LVOT/AV VTI ratio: 0.65  AORTA Ao Root diam: 3.50 cm Ao Asc diam:  3.50 cm MITRAL VALVE               TRICUSPID VALVE MV Area (PHT): 2.29 cm    TR Peak grad:   30.5 mmHg MV Area VTI:   4.12 cm    TR Vmax:        276.00 cm/s MV Peak grad:  2.3 mmHg MV Mean grad:  1.0 mmHg    SHUNTS MV Vmax:       0.75 m/s    Systemic VTI:  0.19 m MV Vmean:      44.2 cm/s   Systemic Diam: 2.20 cm MV Decel Time: 331 msec MV E velocity: 53.10 cm/s MV A velocity: 76.20 cm/s MV E/A ratio:  0.70 Lennie Odor MD Electronically signed by Lennie Odor MD Signature Date/Time: 01/13/2023/2:02:02 PM    Final    DG CHEST PORT 1 VIEW  Result Date: 01/12/2023 CLINICAL DATA:  Preop EXAM: PORTABLE CHEST 1 VIEW COMPARISON:  Multiple priors, most recent chest x-ray dated December 22, 2022 FINDINGS: Patient is rotated to the left. Cardiac and mediastinal contours are unchanged. Left pleural thickening and atelectasis, unchanged when compared with prior exam. No evidence of pneumothorax. Fracture of the proximal left humerus. IMPRESSION: 1. Left pleural thickening and atelectasis, unchanged when compared with the prior exam. No evidence of acute airspace opacity, although patient positioning somewhat limits evaluation. 2. Fracture of the proximal left humerus. Electronically Signed   By: Allegra Lai M.D.   On: 01/12/2023 16:25    Microbiology: Results for orders placed or performed during the hospital encounter of 01/12/23  Surgical pcr screen     Status: None   Collection Time: 01/12/23  8:08 PM   Specimen: Nasal Mucosa; Nasal Swab  Result Value Ref Range Status   MRSA, PCR NEGATIVE NEGATIVE Final   Staphylococcus aureus NEGATIVE NEGATIVE Final    Comment: (NOTE) The Xpert SA Assay (FDA  approved for NASAL specimens in patients 56 years of age and older), is one component of a comprehensive surveillance program. It is not intended to diagnose infection nor to guide or monitor treatment. Performed at [facility], [address] [place], [place] [address]     Labs: CBC: Recent Labs  Lab 02/02/23 1400 02/03/23 0436  WBC 8.4 6.6  NEUTROABS 6.3  --   HGB 11.8* 12.4*  HCT 36.7* 38.6*  MCV 82.8 83.5  PLT 231 220   Basic Metabolic Panel: Recent Labs  Lab 02/02/23 1400 02/02/23 1546 02/03/23 0436  NA 136  --  139  K 3.0*  --  3.7  CL 109  --  111  CO2 18*  --  21*  GLUCOSE 174*  --  113*  BUN 20  --  17  CREATININE 1.66*  --  1.31*  CALCIUM 7.8*  --  8.1*  MG  --  1.3* 2.0   Liver Function Tests: Recent Labs  Lab 02/02/23 1400  AST 12*  ALT 12  ALKPHOS 80  BILITOT 0.4  PROT 6.1*  ALBUMIN 2.9*   CBG: Recent Labs  Lab 02/02/23 1343 02/02/23 2131 02/03/23 0525 02/03/23 1205  GLUCAP 165* 132* 118* 102*    Discharge time spent: greater than 30 minutes.  Signed: Vassie Loll, MD [facility] 02/03/2023

## 2023-02-03 NOTE — Care Management Obs Status (Signed)
MEDICARE OBSERVATION STATUS NOTIFICATION   Patient Details  Name: JASKARN SCHWEER MRN: [id number] Date of Birth: 1955-07-29   Medicare Observation Status Notification Given:  Yes    Karn Cassis, LCSW 02/03/2023, 3:27 PM

## 2023-02-03 NOTE — Care Management CC44 (Signed)
Condition Code 44 Documentation Completed  Patient Details  Name: [name] MRN: [id number] Date of Birth: 1955/11/10   Condition Code 44 given:  Yes Patient signature on Condition Code 44 notice:  Yes Documentation of 2 MD's agreement:  Yes Code 44 added to claim:  Yes    Karn Cassis, LCSW 02/03/2023, 3:27 PM

## 2023-02-03 NOTE — Progress Notes (Addendum)
   02/03/23 1026  TOC Brief Assessment  Insurance and Status Reviewed  Patient has primary care physician Yes  Home environment has been reviewed Lives with mother.  Prior level of function: Independent.  Prior/Current Home Services No current home services  Social Determinants of Health Reivew SDOH reviewed no interventions necessary  Readmission risk has been reviewed Yes  Transition of care needs no transition of care needs at this time   Pt admitted due to acute metabolic encephalopathy. LCSW spoke with pt's mother as pt oriented to self only per chart. Pt's mother reports pt lives with her. He is independent with ADLs. No current home health services. LCSW noted documentation that pt was concerned about safety at home and concerned for his mother. Pt's mother reports no safety concerns. She states pt's brother lives next door and helps as needed. TOC will follow.

## 2023-02-03 NOTE — TOC Progression Note (Addendum)
Transition of Care [facility]) - Progression Note    Patient Details  Name: [name] MRN: [id number] Date of Birth: 03/11/1956  Transition of Care [facility]) CM/SW Contact  Karn Cassis, [place] Phone Number: 02/03/2023, 4:05 PM  Clinical Narrative:  Pt states he has no one to bring him portable O2 tank to get home. Home O2 with [employer]. LCSW called Mitch with [employer] and he will have portable tanks delivered to room today. Pt said he is not going home. He is aware he will need to notify [employer] where he is going for concentrator. Marthann Schiller is requesting follow up by [employer] with pt. Pt is considering [facility] where he has friends and family. RN and MD updated.    Update: LCSW received call from [employer] that pt refused O2 tank delivery. AC, MD, and RN updated.     Barriers to Discharge: Continued Medical Work up  Expected Discharge Plan and Services         Expected Discharge Date: 02/03/23                                     Social Determinants of Health (SDOH) Interventions SDOH Screenings   Food Insecurity: No Food Insecurity (02/03/2023)  Housing: Patient Declined (02/03/2023)  Transportation Needs: No Transportation Needs (02/03/2023)  Utilities: Not At Risk (02/03/2023)  Depression (PHQ2-9): Low Risk  (04/24/2022)  Financial Resource Strain: Low Risk  (05/19/2019)  Physical Activity: Inactive (05/19/2019)  Social Connections: Unknown (11/28/2021)   Received from [facility]  Stress: No Stress Concern Present (05/19/2019)  Tobacco Use: High Risk (02/02/2023)    Readmission Risk Interventions    01/19/2023    1:19 PM 01/13/2023   10:44 AM  Readmission Risk Prevention Plan  Transportation Screening  Complete  HRI or Home Care Consult  Complete  Social Work Consult for Recovery Care Planning/Counseling  Complete  Palliative Care Screening  Not Applicable  Medication Review Oceanographer)  Complete  PCP or Specialist appointment within 3-5 days of discharge  Complete   HRI or Home Care Consult Complete   SW Recovery Care/Counseling Consult Complete   Palliative Care Screening Not Applicable   Skilled Nursing Facility Not Applicable

## 2023-02-03 NOTE — Progress Notes (Signed)
Mobility Specialist Progress Note:    02/03/23 1442  Mobility  Activity Ambulated with assistance in hallway;Ambulated with assistance in room  Level of Assistance Minimal assist, patient does 75% or more  Assistive Device Front wheel walker  Distance Ambulated (ft) 25 ft  Range of Motion/Exercises Active;Right arm;Right leg;Left leg  LUE Weight Bearing WBAT  Activity Response Tolerated well  Mobility Referral Yes  $Mobility charge 1 Mobility  Mobility Specialist Start Time (ACUTE ONLY) 1430  Mobility Specialist Stop Time (ACUTE ONLY) 1443  Mobility Specialist Time Calculation (min) (ACUTE ONLY) 13 min   Pt received in bed, agreeable to mobility session. Pt sat EOB, reported lightheadedness. After a minute the dizziness subsided, required MinA to stand. CGA required the entire session d/t pt's staggering cadence and slight LOB. Ambulated in hallway via RW with one standing rest break. Tolerated well, responded well to cues. Returned pt to bed, nursing staff in room. All needs met, call bell in reach. Nurse notified.   Feliciana Rossetti Mobility Specialist Please contact via Special educational needs teacher or  Rehab office at [phone]

## 2023-02-03 NOTE — Assessment & Plan Note (Signed)
 -  Continue the use of CPAP nightly ?

## 2023-02-03 NOTE — Progress Notes (Signed)
Patient discharged home with instructions given on medications and follow up visits,verbalized understanding . IV discontinued,catheter intact. Accompanied by staff to an awaiting vehicle.

## 2023-02-03 NOTE — TOC Progression Note (Addendum)
Transition of Care [facility]) - Progression Note    Patient Details  Name: [name] MRN: [id number] Date of Birth: 1955-11-14  [facility] of Care [facility]) CM/SW Contact  Karn Cassis, [place] Phone Number: 02/03/2023, 1:34 PM  Clinical Narrative:  Pt requesting assistance with new living arrangements. He states he doesn't feel safe around his brother, [name]. However, [name] does not live in the home. He lives next door. Pt has restraining order against [name]. Discussed situation with supervisor. Pt aware this is not reason for placement. Pt can look into alternative housing options or hotel if needed. Pt requested LCSW speak with his brother, [name] and give him same information. [name] states [name] is not the innocent victim and said [name] is no threat to [place]. [name] said DSS has been out to home and nothing was done. Past notes in chart indicate DSS screened out recent report. Per [name], the issue is their nephew, [name] and his wife [name] who live in the home. He is trying to get them out and plans to talk to family again about this. Pt's mother is oriented and able to care for herself. Discussed family calling police if needed. Per chart, pt was d/c with [facility] home health. LCSW has reached out to [place] with [facility] to confirm still active.    Pt requesting homeless shelter list. Will provide.   Per Clydie Braun with [facility], they have not had pt in the past. MD updated.     Barriers to Discharge: Continued Medical Work up  Expected Discharge Plan and Services                                               Social Determinants of Health (SDOH) Interventions SDOH Screenings   Food Insecurity: No Food Insecurity (02/03/2023)  Housing: Patient Declined (02/03/2023)  Transportation Needs: No Transportation Needs (02/03/2023)  Utilities: Not At Risk (02/03/2023)  Depression (PHQ2-9): Low Risk  (04/24/2022)  Financial Resource Strain: Low Risk  (05/19/2019)  Physical Activity:  Inactive (05/19/2019)  Social Connections: Unknown (11/28/2021)   Received from [facility]  Stress: No Stress Concern Present (05/19/2019)  Tobacco Use: High Risk (02/02/2023)    Readmission Risk Interventions    01/19/2023    1:19 PM 01/13/2023   10:44 AM  Readmission Risk Prevention Plan  Transportation Screening  Complete  HRI or Home Care Consult  Complete  Social Work Consult for Recovery Care Planning/Counseling  Complete  Palliative Care Screening  Not Applicable  Medication Review Oceanographer)  Complete  PCP or Specialist appointment within 3-5 days of discharge Complete   HRI or Home Care Consult Complete   SW Recovery Care/Counseling Consult Complete   Palliative Care Screening Not Applicable   Skilled Nursing Facility Not Applicable

## 2023-02-03 NOTE — Progress Notes (Signed)
RN reassessed Pt at 0215.  Pt was alert and concerned about his whereabouts.  RN told him he was at "the hospital [facility]", Pt acknowledged the location.  Pt expressed concern about his safety.  Pt stated "Please [name] away from me and my mother.:"  [name] is his Brother.  Pt stated [name] is responsible for his broken shoulder.  Pt reiterated he does not feel safe at home with his brother [name] and is concern about his mother's safety.

## 2023-02-03 NOTE — Assessment & Plan Note (Signed)
-  Stable -Continue patient follow-up with pulmonology service.

## 2023-02-05 ENCOUNTER — Ambulatory Visit: Payer: 59 | Admitting: Nurse Practitioner

## 2023-02-11 DIAGNOSIS — E119 Type 2 diabetes mellitus without complications: Secondary | ICD-10-CM | POA: Diagnosis not present

## 2023-02-11 DIAGNOSIS — Z794 Long term (current) use of insulin: Secondary | ICD-10-CM | POA: Diagnosis not present

## 2023-02-15 ENCOUNTER — Emergency Department (HOSPITAL_COMMUNITY): Payer: 59

## 2023-02-15 ENCOUNTER — Other Ambulatory Visit: Payer: Self-pay

## 2023-02-15 ENCOUNTER — Encounter (HOSPITAL_COMMUNITY): Payer: Self-pay

## 2023-02-15 ENCOUNTER — Inpatient Hospital Stay (HOSPITAL_COMMUNITY)
Admission: EM | Admit: 2023-02-15 | Discharge: 2023-03-04 | DRG: 871 | Disposition: A | Discharge status: Hospice | Payer: 59 | Attending: Internal Medicine | Admitting: Internal Medicine

## 2023-02-15 DIAGNOSIS — R197 Diarrhea, unspecified: Secondary | ICD-10-CM | POA: Diagnosis present

## 2023-02-15 DIAGNOSIS — D649 Anemia, unspecified: Secondary | ICD-10-CM | POA: Diagnosis not present

## 2023-02-15 DIAGNOSIS — J439 Emphysema, unspecified: Secondary | ICD-10-CM | POA: Diagnosis present

## 2023-02-15 DIAGNOSIS — K219 Gastro-esophageal reflux disease without esophagitis: Secondary | ICD-10-CM | POA: Diagnosis present

## 2023-02-15 DIAGNOSIS — A419 Sepsis, unspecified organism: Secondary | ICD-10-CM | POA: Diagnosis present

## 2023-02-15 DIAGNOSIS — E1159 Type 2 diabetes mellitus with other circulatory complications: Secondary | ICD-10-CM | POA: Diagnosis not present

## 2023-02-15 DIAGNOSIS — I513 Intracardiac thrombosis, not elsewhere classified: Secondary | ICD-10-CM | POA: Diagnosis present

## 2023-02-15 DIAGNOSIS — R34 Anuria and oliguria: Secondary | ICD-10-CM | POA: Diagnosis present

## 2023-02-15 DIAGNOSIS — R0789 Other chest pain: Secondary | ICD-10-CM | POA: Diagnosis not present

## 2023-02-15 DIAGNOSIS — E440 Moderate protein-calorie malnutrition: Secondary | ICD-10-CM | POA: Diagnosis present

## 2023-02-15 DIAGNOSIS — I5032 Chronic diastolic (congestive) heart failure: Secondary | ICD-10-CM | POA: Diagnosis present

## 2023-02-15 DIAGNOSIS — R6520 Severe sepsis without septic shock: Secondary | ICD-10-CM | POA: Diagnosis present

## 2023-02-15 DIAGNOSIS — R079 Chest pain, unspecified: Secondary | ICD-10-CM | POA: Diagnosis not present

## 2023-02-15 DIAGNOSIS — J849 Interstitial pulmonary disease, unspecified: Secondary | ICD-10-CM | POA: Diagnosis present

## 2023-02-15 DIAGNOSIS — N170 Acute kidney failure with tubular necrosis: Secondary | ICD-10-CM | POA: Diagnosis present

## 2023-02-15 DIAGNOSIS — E669 Obesity, unspecified: Secondary | ICD-10-CM | POA: Diagnosis present

## 2023-02-15 DIAGNOSIS — L089 Local infection of the skin and subcutaneous tissue, unspecified: Secondary | ICD-10-CM | POA: Diagnosis not present

## 2023-02-15 DIAGNOSIS — Z9049 Acquired absence of other specified parts of digestive tract: Secondary | ICD-10-CM

## 2023-02-15 DIAGNOSIS — R627 Adult failure to thrive: Secondary | ICD-10-CM | POA: Diagnosis present

## 2023-02-15 DIAGNOSIS — D631 Anemia in chronic kidney disease: Secondary | ICD-10-CM | POA: Diagnosis present

## 2023-02-15 DIAGNOSIS — Z9981 Dependence on supplemental oxygen: Secondary | ICD-10-CM

## 2023-02-15 DIAGNOSIS — E872 Acidosis, unspecified: Secondary | ICD-10-CM | POA: Diagnosis present

## 2023-02-15 DIAGNOSIS — Z743 Need for continuous supervision: Secondary | ICD-10-CM | POA: Diagnosis not present

## 2023-02-15 DIAGNOSIS — Z885 Allergy status to narcotic agent status: Secondary | ICD-10-CM

## 2023-02-15 DIAGNOSIS — E782 Mixed hyperlipidemia: Secondary | ICD-10-CM | POA: Diagnosis not present

## 2023-02-15 DIAGNOSIS — E871 Hypo-osmolality and hyponatremia: Secondary | ICD-10-CM | POA: Diagnosis present

## 2023-02-15 DIAGNOSIS — R54 Age-related physical debility: Secondary | ICD-10-CM | POA: Diagnosis present

## 2023-02-15 DIAGNOSIS — I252 Old myocardial infarction: Secondary | ICD-10-CM

## 2023-02-15 DIAGNOSIS — M7989 Other specified soft tissue disorders: Secondary | ICD-10-CM | POA: Diagnosis not present

## 2023-02-15 DIAGNOSIS — Z794 Long term (current) use of insulin: Secondary | ICD-10-CM | POA: Diagnosis not present

## 2023-02-15 DIAGNOSIS — J9611 Chronic respiratory failure with hypoxia: Secondary | ICD-10-CM | POA: Diagnosis present

## 2023-02-15 DIAGNOSIS — N183 Chronic kidney disease, stage 3 unspecified: Secondary | ICD-10-CM | POA: Diagnosis not present

## 2023-02-15 DIAGNOSIS — E876 Hypokalemia: Secondary | ICD-10-CM | POA: Diagnosis present

## 2023-02-15 DIAGNOSIS — Z833 Family history of diabetes mellitus: Secondary | ICD-10-CM

## 2023-02-15 DIAGNOSIS — N179 Acute kidney failure, unspecified: Secondary | ICD-10-CM | POA: Diagnosis not present

## 2023-02-15 DIAGNOSIS — N1831 Chronic kidney disease, stage 3a: Secondary | ICD-10-CM | POA: Diagnosis present

## 2023-02-15 DIAGNOSIS — E875 Hyperkalemia: Secondary | ICD-10-CM | POA: Diagnosis not present

## 2023-02-15 DIAGNOSIS — E1151 Type 2 diabetes mellitus with diabetic peripheral angiopathy without gangrene: Secondary | ICD-10-CM | POA: Diagnosis present

## 2023-02-15 DIAGNOSIS — L0390 Cellulitis, unspecified: Secondary | ICD-10-CM | POA: Diagnosis not present

## 2023-02-15 DIAGNOSIS — M25474 Effusion, right foot: Secondary | ICD-10-CM | POA: Diagnosis not present

## 2023-02-15 DIAGNOSIS — I2510 Atherosclerotic heart disease of native coronary artery without angina pectoris: Secondary | ICD-10-CM | POA: Diagnosis present

## 2023-02-15 DIAGNOSIS — E1122 Type 2 diabetes mellitus with diabetic chronic kidney disease: Secondary | ICD-10-CM | POA: Diagnosis present

## 2023-02-15 DIAGNOSIS — I10 Essential (primary) hypertension: Secondary | ICD-10-CM | POA: Diagnosis not present

## 2023-02-15 DIAGNOSIS — Z89432 Acquired absence of left foot: Secondary | ICD-10-CM

## 2023-02-15 DIAGNOSIS — Z6829 Body mass index (BMI) 29.0-29.9, adult: Secondary | ICD-10-CM

## 2023-02-15 DIAGNOSIS — I739 Peripheral vascular disease, unspecified: Secondary | ICD-10-CM | POA: Diagnosis not present

## 2023-02-15 DIAGNOSIS — Z66 Do not resuscitate: Secondary | ICD-10-CM | POA: Diagnosis present

## 2023-02-15 DIAGNOSIS — Z0389 Encounter for observation for other suspected diseases and conditions ruled out: Secondary | ICD-10-CM | POA: Diagnosis not present

## 2023-02-15 DIAGNOSIS — F32A Depression, unspecified: Secondary | ICD-10-CM | POA: Diagnosis present

## 2023-02-15 DIAGNOSIS — I700 Atherosclerosis of aorta: Secondary | ICD-10-CM | POA: Diagnosis not present

## 2023-02-15 DIAGNOSIS — F411 Generalized anxiety disorder: Secondary | ICD-10-CM | POA: Diagnosis not present

## 2023-02-15 DIAGNOSIS — Z888 Allergy status to other drugs, medicaments and biological substances status: Secondary | ICD-10-CM

## 2023-02-15 DIAGNOSIS — Z801 Family history of malignant neoplasm of trachea, bronchus and lung: Secondary | ICD-10-CM

## 2023-02-15 DIAGNOSIS — F17210 Nicotine dependence, cigarettes, uncomplicated: Secondary | ICD-10-CM | POA: Diagnosis present

## 2023-02-15 DIAGNOSIS — G629 Polyneuropathy, unspecified: Secondary | ICD-10-CM

## 2023-02-15 DIAGNOSIS — R339 Retention of urine, unspecified: Secondary | ICD-10-CM | POA: Diagnosis present

## 2023-02-15 DIAGNOSIS — F17200 Nicotine dependence, unspecified, uncomplicated: Secondary | ICD-10-CM | POA: Diagnosis not present

## 2023-02-15 DIAGNOSIS — D509 Iron deficiency anemia, unspecified: Secondary | ICD-10-CM | POA: Diagnosis present

## 2023-02-15 DIAGNOSIS — G4733 Obstructive sleep apnea (adult) (pediatric): Secondary | ICD-10-CM | POA: Diagnosis present

## 2023-02-15 DIAGNOSIS — T508X5A Adverse effect of diagnostic agents, initial encounter: Secondary | ICD-10-CM | POA: Diagnosis present

## 2023-02-15 DIAGNOSIS — E1142 Type 2 diabetes mellitus with diabetic polyneuropathy: Secondary | ICD-10-CM | POA: Diagnosis present

## 2023-02-15 DIAGNOSIS — Z8701 Personal history of pneumonia (recurrent): Secondary | ICD-10-CM

## 2023-02-15 DIAGNOSIS — I129 Hypertensive chronic kidney disease with stage 1 through stage 4 chronic kidney disease, or unspecified chronic kidney disease: Secondary | ICD-10-CM | POA: Diagnosis not present

## 2023-02-15 DIAGNOSIS — G6289 Other specified polyneuropathies: Secondary | ICD-10-CM | POA: Diagnosis not present

## 2023-02-15 DIAGNOSIS — Z85528 Personal history of other malignant neoplasm of kidney: Secondary | ICD-10-CM

## 2023-02-15 DIAGNOSIS — L03115 Cellulitis of right lower limb: Secondary | ICD-10-CM | POA: Diagnosis present

## 2023-02-15 DIAGNOSIS — E11628 Type 2 diabetes mellitus with other skin complications: Secondary | ICD-10-CM | POA: Diagnosis not present

## 2023-02-15 DIAGNOSIS — I130 Hypertensive heart and chronic kidney disease with heart failure and stage 1 through stage 4 chronic kidney disease, or unspecified chronic kidney disease: Secondary | ICD-10-CM | POA: Diagnosis present

## 2023-02-15 DIAGNOSIS — Z8249 Family history of ischemic heart disease and other diseases of the circulatory system: Secondary | ICD-10-CM

## 2023-02-15 DIAGNOSIS — E1165 Type 2 diabetes mellitus with hyperglycemia: Secondary | ICD-10-CM | POA: Diagnosis present

## 2023-02-15 DIAGNOSIS — L89151 Pressure ulcer of sacral region, stage 1: Secondary | ICD-10-CM | POA: Diagnosis present

## 2023-02-15 DIAGNOSIS — I951 Orthostatic hypotension: Secondary | ICD-10-CM | POA: Diagnosis present

## 2023-02-15 DIAGNOSIS — K760 Fatty (change of) liver, not elsewhere classified: Secondary | ICD-10-CM | POA: Diagnosis not present

## 2023-02-15 DIAGNOSIS — Z7984 Long term (current) use of oral hypoglycemic drugs: Secondary | ICD-10-CM

## 2023-02-15 DIAGNOSIS — Z7902 Long term (current) use of antithrombotics/antiplatelets: Secondary | ICD-10-CM

## 2023-02-15 DIAGNOSIS — M2011 Hallux valgus (acquired), right foot: Secondary | ICD-10-CM | POA: Diagnosis not present

## 2023-02-15 DIAGNOSIS — Z9109 Other allergy status, other than to drugs and biological substances: Secondary | ICD-10-CM

## 2023-02-15 DIAGNOSIS — Z515 Encounter for palliative care: Secondary | ICD-10-CM

## 2023-02-15 DIAGNOSIS — M7731 Calcaneal spur, right foot: Secondary | ICD-10-CM | POA: Diagnosis not present

## 2023-02-15 DIAGNOSIS — Z79899 Other long term (current) drug therapy: Secondary | ICD-10-CM

## 2023-02-15 DIAGNOSIS — M19071 Primary osteoarthritis, right ankle and foot: Secondary | ICD-10-CM | POA: Diagnosis not present

## 2023-02-15 DIAGNOSIS — Z7189 Other specified counseling: Secondary | ICD-10-CM | POA: Diagnosis not present

## 2023-02-15 DIAGNOSIS — Z96612 Presence of left artificial shoulder joint: Secondary | ICD-10-CM | POA: Diagnosis not present

## 2023-02-15 DIAGNOSIS — Z905 Acquired absence of kidney: Secondary | ICD-10-CM

## 2023-02-15 DIAGNOSIS — Z8673 Personal history of transient ischemic attack (TIA), and cerebral infarction without residual deficits: Secondary | ICD-10-CM

## 2023-02-15 DIAGNOSIS — I499 Cardiac arrhythmia, unspecified: Secondary | ICD-10-CM | POA: Diagnosis not present

## 2023-02-15 DIAGNOSIS — I7410 Embolism and thrombosis of unspecified parts of aorta: Secondary | ICD-10-CM | POA: Insufficient documentation

## 2023-02-15 DIAGNOSIS — L03119 Cellulitis of unspecified part of limb: Secondary | ICD-10-CM | POA: Insufficient documentation

## 2023-02-15 DIAGNOSIS — R6889 Other general symptoms and signs: Secondary | ICD-10-CM | POA: Diagnosis not present

## 2023-02-15 DIAGNOSIS — Z87442 Personal history of urinary calculi: Secondary | ICD-10-CM

## 2023-02-15 DIAGNOSIS — E119 Type 2 diabetes mellitus without complications: Secondary | ICD-10-CM

## 2023-02-15 DIAGNOSIS — Z823 Family history of stroke: Secondary | ICD-10-CM

## 2023-02-15 LAB — CBC WITH DIFFERENTIAL/PLATELET
Abs Immature Granulocytes: 0.37 10*3/uL — ABNORMAL HIGH (ref 0.00–0.07)
Basophils Absolute: 0 10*3/uL (ref 0.0–0.1)
Basophils Relative: 0 %
Eosinophils Absolute: 0 10*3/uL (ref 0.0–0.5)
Eosinophils Relative: 0 %
HCT: 40.5 % (ref 39.0–52.0)
Hemoglobin: 13.3 g/dL (ref 13.0–17.0)
Immature Granulocytes: 2 %
Lymphocytes Relative: 5 %
Lymphs Abs: 1.1 10*3/uL (ref 0.7–4.0)
MCH: 26.6 pg (ref 26.0–34.0)
MCHC: 32.8 g/dL (ref 30.0–36.0)
MCV: 81 fL (ref 80.0–100.0)
Monocytes Absolute: 1.2 10*3/uL — ABNORMAL HIGH (ref 0.1–1.0)
Monocytes Relative: 6 %
Neutro Abs: 19.5 10*3/uL — ABNORMAL HIGH (ref 1.7–7.7)
Neutrophils Relative %: 87 %
Platelets: 197 10*3/uL (ref 150–400)
RBC: 5 MIL/uL (ref 4.22–5.81)
RDW: 14.5 % (ref 11.5–15.5)
WBC: 22.2 10*3/uL — ABNORMAL HIGH (ref 4.0–10.5)
nRBC: 0 % (ref 0.0–0.2)

## 2023-02-15 LAB — COMPREHENSIVE METABOLIC PANEL
ALT: 13 U/L (ref 0–44)
AST: 14 U/L — ABNORMAL LOW (ref 15–41)
Albumin: 3.2 g/dL — ABNORMAL LOW (ref 3.5–5.0)
Alkaline Phosphatase: 84 U/L (ref 38–126)
Anion gap: 8 (ref 5–15)
BUN: 17 mg/dL (ref 8–23)
CO2: 18 mmol/L — ABNORMAL LOW (ref 22–32)
Calcium: 8.1 mg/dL — ABNORMAL LOW (ref 8.9–10.3)
Chloride: 102 mmol/L (ref 98–111)
Creatinine, Ser: 1.39 mg/dL — ABNORMAL HIGH (ref 0.61–1.24)
GFR, Estimated: 56 mL/min — ABNORMAL LOW (ref >60)
Glucose, Bld: 172 mg/dL — ABNORMAL HIGH (ref 70–99)
Potassium: 2.6 mmol/L — CL (ref 3.5–5.1)
Sodium: 128 mmol/L — ABNORMAL LOW (ref 135–145)
Total Bilirubin: 1.5 mg/dL — ABNORMAL HIGH (ref 0.3–1.2)
Total Protein: 7.4 g/dL (ref 6.5–8.1)

## 2023-02-15 LAB — LACTIC ACID, PLASMA
Lactic Acid, Venous: 3.1 mmol/L (ref 0.5–1.9)
Lactic Acid, Venous: 1.7 mmol/L (ref 0.5–1.9)

## 2023-02-15 LAB — BASIC METABOLIC PANEL
Anion gap: 6 (ref 5–15)
BUN: 17 mg/dL (ref 8–23)
CO2: 20 mmol/L — ABNORMAL LOW (ref 22–32)
Calcium: 7.3 mg/dL — ABNORMAL LOW (ref 8.9–10.3)
Chloride: 108 mmol/L (ref 98–111)
Creatinine, Ser: 1.23 mg/dL (ref 0.61–1.24)
GFR, Estimated: >60 mL/min (ref >60)
Glucose, Bld: 203 mg/dL — ABNORMAL HIGH (ref 70–99)
Potassium: 2.5 mmol/L — CL (ref 3.5–5.1)
Sodium: 134 mmol/L — ABNORMAL LOW (ref 135–145)

## 2023-02-15 LAB — GLUCOSE, CAPILLARY: Glucose-Capillary: 164 mg/dL — ABNORMAL HIGH (ref 70–99)

## 2023-02-15 LAB — APTT: aPTT: 30 seconds (ref 24–36)

## 2023-02-15 LAB — TROPONIN I (HIGH SENSITIVITY)
Troponin I (High Sensitivity): 20 ng/L — ABNORMAL HIGH (ref <18)
Troponin I (High Sensitivity): 17 ng/L (ref <18)

## 2023-02-15 LAB — PROTIME-INR
INR: 1.1 (ref 0.8–1.2)
Prothrombin Time: 14.7 seconds (ref 11.4–15.2)

## 2023-02-15 LAB — BRAIN NATRIURETIC PEPTIDE: B Natriuretic Peptide: 367 pg/mL — ABNORMAL HIGH (ref 0.0–100.0)

## 2023-02-15 MED ORDER — PANTOPRAZOLE SODIUM 40 MG PO TBEC
40.0000 mg | DELAYED_RELEASE_TABLET | Freq: Every day | ORAL | Status: DC
  Administered 2023-02-15 – 2023-03-04 (×18): 40 mg via ORAL
  Filled 2023-02-15 (×18): qty 1

## 2023-02-15 MED ORDER — SODIUM CHLORIDE 0.9 % IV SOLN
1.0000 g | INTRAVENOUS | Status: AC
  Administered 2023-02-16 – 2023-02-22 (×7): 1 g via INTRAVENOUS
  Filled 2023-02-15 (×8): qty 10

## 2023-02-15 MED ORDER — CLOPIDOGREL BISULFATE 75 MG PO TABS
75.0000 mg | ORAL_TABLET | Freq: Every day | ORAL | Status: DC
  Administered 2023-02-15 – 2023-03-02 (×16): 75 mg via ORAL
  Filled 2023-02-15 (×17): qty 1

## 2023-02-15 MED ORDER — ATORVASTATIN CALCIUM 20 MG PO TABS
20.0000 mg | ORAL_TABLET | Freq: Every day | ORAL | Status: DC
  Administered 2023-02-15 – 2023-03-01 (×15): 20 mg via ORAL
  Filled 2023-02-15 (×15): qty 1

## 2023-02-15 MED ORDER — IOHEXOL 350 MG/ML SOLN
100.0000 mL | Freq: Once | INTRAVENOUS | Status: AC | PRN
  Administered 2023-02-15: 100 mL via INTRAVENOUS

## 2023-02-15 MED ORDER — OXYCODONE HCL 5 MG PO TABS
5.0000 mg | ORAL_TABLET | ORAL | Status: DC | PRN
  Administered 2023-02-15 – 2023-02-23 (×6): 5 mg via ORAL
  Filled 2023-02-15 (×6): qty 1

## 2023-02-15 MED ORDER — INSULIN ASPART 100 UNIT/ML IJ SOLN
0.0000 [IU] | Freq: Three times a day (TID) | INTRAMUSCULAR | Status: DC
  Administered 2023-02-16: 2 [IU] via SUBCUTANEOUS
  Administered 2023-02-16: 3 [IU] via SUBCUTANEOUS
  Administered 2023-02-17 – 2023-02-19 (×5): 2 [IU] via SUBCUTANEOUS
  Administered 2023-02-20: 3 [IU] via SUBCUTANEOUS
  Administered 2023-02-20 – 2023-02-21 (×3): 2 [IU] via SUBCUTANEOUS
  Administered 2023-02-22: 3 [IU] via SUBCUTANEOUS
  Administered 2023-02-22 – 2023-02-24 (×4): 2 [IU] via SUBCUTANEOUS

## 2023-02-15 MED ORDER — ACETAMINOPHEN 650 MG RE SUPP: 650.0000 mg | Freq: Four times a day (QID) | RECTAL | Status: DC | PRN

## 2023-02-15 MED ORDER — SODIUM CHLORIDE 0.9 % IV SOLN
1.0000 g | Freq: Once | INTRAVENOUS | Status: AC
  Administered 2023-02-15: 1 g via INTRAVENOUS
  Filled 2023-02-15: qty 10

## 2023-02-15 MED ORDER — FLUOXETINE HCL 20 MG PO CAPS
40.0000 mg | ORAL_CAPSULE | Freq: Every day | ORAL | Status: DC
  Administered 2023-02-15 – 2023-03-04 (×18): 40 mg via ORAL
  Filled 2023-02-15 (×19): qty 2

## 2023-02-15 MED ORDER — SODIUM CHLORIDE 0.9 % IV BOLUS
1000.0000 mL | Freq: Once | INTRAVENOUS | Status: AC
  Administered 2023-02-15: 1000 mL via INTRAVENOUS

## 2023-02-15 MED ORDER — MORPHINE SULFATE (PF) 2 MG/ML IV SOLN
2.0000 mg | INTRAVENOUS | Status: DC | PRN
  Administered 2023-02-16 – 2023-02-24 (×4): 2 mg via INTRAVENOUS
  Filled 2023-02-15 (×4): qty 1

## 2023-02-15 MED ORDER — ZOLPIDEM TARTRATE 5 MG PO TABS
5.0000 mg | ORAL_TABLET | Freq: Every evening | ORAL | Status: DC | PRN
  Administered 2023-02-15 – 2023-03-01 (×5): 5 mg via ORAL
  Filled 2023-02-15 (×6): qty 1

## 2023-02-15 MED ORDER — SODIUM CHLORIDE 0.9 % IV BOLUS
500.0000 mL | Freq: Once | INTRAVENOUS | Status: AC
  Administered 2023-02-15: 500 mL via INTRAVENOUS

## 2023-02-15 MED ORDER — ACETAMINOPHEN 325 MG PO TABS
650.0000 mg | ORAL_TABLET | Freq: Once | ORAL | Status: AC
  Administered 2023-02-15: 650 mg via ORAL
  Filled 2023-02-15: qty 2

## 2023-02-15 MED ORDER — RANOLAZINE ER 500 MG PO TB12
500.0000 mg | ORAL_TABLET | Freq: Two times a day (BID) | ORAL | Status: DC
  Administered 2023-02-15 – 2023-03-04 (×34): 500 mg via ORAL
  Filled 2023-02-15 (×34): qty 1

## 2023-02-15 MED ORDER — POTASSIUM CHLORIDE IN NACL 40-0.9 MEQ/L-% IV SOLN: INTRAVENOUS | Status: DC

## 2023-02-15 MED ORDER — MIDODRINE HCL 5 MG PO TABS
2.5000 mg | ORAL_TABLET | Freq: Two times a day (BID) | ORAL | Status: DC
  Administered 2023-02-15 – 2023-03-04 (×34): 2.5 mg via ORAL
  Filled 2023-02-15 (×33): qty 1

## 2023-02-15 MED ORDER — ISOSORBIDE MONONITRATE ER 60 MG PO TB24
60.0000 mg | ORAL_TABLET | Freq: Every day | ORAL | Status: DC
  Administered 2023-02-16 – 2023-02-24 (×9): 60 mg via ORAL
  Filled 2023-02-15 (×9): qty 1

## 2023-02-15 MED ORDER — ALPRAZOLAM 1 MG PO TABS
1.0000 mg | ORAL_TABLET | Freq: Three times a day (TID) | ORAL | Status: DC
  Administered 2023-02-15 – 2023-02-23 (×21): 1 mg via ORAL
  Filled 2023-02-15 (×22): qty 1

## 2023-02-15 MED ORDER — INSULIN DETEMIR 100 UNIT/ML ~~LOC~~ SOLN
20.0000 [IU] | Freq: Every day | SUBCUTANEOUS | Status: DC
  Administered 2023-02-15 – 2023-02-23 (×9): 20 [IU] via SUBCUTANEOUS
  Filled 2023-02-15 (×10): qty 0.2

## 2023-02-15 MED ORDER — HEPARIN SODIUM (PORCINE) 5000 UNIT/ML IJ SOLN
5000.0000 [IU] | Freq: Three times a day (TID) | INTRAMUSCULAR | Status: DC
  Administered 2023-02-15 – 2023-03-02 (×44): 5000 [IU] via SUBCUTANEOUS
  Filled 2023-02-15 (×43): qty 1

## 2023-02-15 MED ORDER — ONDANSETRON HCL 4 MG/2ML IJ SOLN: 4.0000 mg | Freq: Four times a day (QID) | INTRAMUSCULAR | Status: DC | PRN

## 2023-02-15 MED ORDER — POTASSIUM CHLORIDE CRYS ER 20 MEQ PO TBCR
20.0000 meq | EXTENDED_RELEASE_TABLET | Freq: Two times a day (BID) | ORAL | Status: DC
  Administered 2023-02-15 – 2023-02-18 (×6): 20 meq via ORAL
  Filled 2023-02-15 (×6): qty 1

## 2023-02-15 MED ORDER — ONDANSETRON HCL 4 MG PO TABS
4.0000 mg | ORAL_TABLET | Freq: Four times a day (QID) | ORAL | Status: DC | PRN
  Administered 2023-02-27: 4 mg via ORAL
  Filled 2023-02-15: qty 1

## 2023-02-15 MED ORDER — ACETAMINOPHEN 325 MG PO TABS
650.0000 mg | ORAL_TABLET | Freq: Four times a day (QID) | ORAL | Status: DC | PRN
  Administered 2023-02-17 – 2023-03-02 (×13): 650 mg via ORAL
  Filled 2023-02-15 (×13): qty 2

## 2023-02-15 MED ORDER — INSULIN ASPART 100 UNIT/ML IJ SOLN: 0.0000 [IU] | Freq: Every day | INTRAMUSCULAR | Status: DC

## 2023-02-15 MED ORDER — POTASSIUM CHLORIDE 10 MEQ/100ML IV SOLN
10.0000 meq | INTRAVENOUS | Status: AC
  Administered 2023-02-15 (×4): 10 meq via INTRAVENOUS
  Filled 2023-02-15 (×4): qty 100

## 2023-02-15 MED ORDER — ISOSORBIDE MONONITRATE ER 30 MG PO TB24
30.0000 mg | ORAL_TABLET | Freq: Every day | ORAL | Status: DC
  Administered 2023-02-15 – 2023-02-23 (×9): 30 mg via ORAL
  Filled 2023-02-15 (×9): qty 1

## 2023-02-15 MED ORDER — LOPERAMIDE HCL 2 MG PO CAPS
2.0000 mg | ORAL_CAPSULE | ORAL | Status: DC | PRN
  Administered 2023-02-15 – 2023-02-16 (×2): 2 mg via ORAL
  Filled 2023-02-15 (×3): qty 1

## 2023-02-15 MED ORDER — ISOSORBIDE MONONITRATE ER 60 MG PO TB24: 60.0000 mg | ORAL_TABLET | Freq: Every morning | ORAL | Status: DC

## 2023-02-15 NOTE — Assessment & Plan Note (Signed)
-   Temp 101.4, heart rate 108, respiratory rate 33, leukocytosis at 22.2, lactic acidosis at 3.1 - Physical exam reveals an erythematous and tender right foot - Rocephin started in ED - Continue Rocephin - Tylenol 650 mg given in the ED - Continue Tylenol for fever - Patient was given 2 L bolus in the ED - Continue IV fluids at 75 mill per hour - Continue to monitor

## 2023-02-15 NOTE — ED Triage Notes (Signed)
Pt coming from home. Complains of chest pain that started last night. Localized to center of chest. Pt has a DNR. Pt had 2 nitroglycerin and 4 baby ASA. Surgery on left should 1 month ago.Hx of CHF, DM II, COPD per pt. O2 at 6 L/min via n/c at home. Pt right foot swollen and red with red streaks going up leg.

## 2023-02-15 NOTE — Progress Notes (Signed)
Critical K=2.5, MD Adefeso text paged, awaiting orders.

## 2023-02-15 NOTE — ED Provider Notes (Signed)
[name] EMERGENCY DEPARTMENT AT [facility] Provider Note   CSN: [id number] Arrival date & time: 02/15/23  1121     History  Chief Complaint  Patient presents with   Chest Pain    Started last night    [name] is a 67 y.o. male.  Patient brought in by EMS from home.  Complaining of increased shortness of breath and chest pain that started last night.  History of congestive heart failure.  Was given 2 nitroglycerin and 4 baby aspirin.  Is baseline on 6 L nasal cannula oxygen.  Also complaining of pain of his right foot and leg with redness.  Also complaining of multiple episodes of nonbloody diarrhea that started yesterday.  No abdominal pain no fevers although has felt chills.  The history is provided by the patient.  Chest Pain Pain location:  Substernal area Pain quality: aching   Pain severity:  Moderate Onset quality:  Gradual Duration:  15 hours Timing:  Constant Progression:  Unchanged Chronicity:  Recurrent Context: at rest   Relieved by:  Nothing Worsened by:  Nothing Ineffective treatments:  None tried Associated symptoms: nausea and shortness of breath   Associated symptoms: no cough, no diaphoresis, no fever and no vomiting        Home Medications Prior to Admission medications   Medication Sig Start Date End Date Taking? Authorizing Provider  albuterol (PROVENTIL) (2.5 MG/3ML) 0.083% nebulizer solution Take 3 mLs (2.5 mg total) by nebulization every 6 (six) hours as needed for wheezing or shortness of breath. 09/04/22   Cobb, Ruby Cola, NP  ALPRAZolam Prudy Feeler) 1 MG tablet Take 1 mg by mouth 3 (three) times daily. 01/07/21   [provider]  atorvastatin (LIPITOR) 20 MG tablet Take 20 mg by mouth at bedtime. 04/26/18   [provider]  budesonide (PULMICORT) 1 MG/2ML nebulizer solution Generic for Pulmicort. Inhale one vial in nebulizer twice a day. Rinse mouth after use. 02/01/23   Cobb, Ruby Cola, NP  clopidogrel (PLAVIX)  75 MG tablet TAKE ONE TABLET BY MOUTH EVERY DAY Patient not taking: Reported on 02/03/2023 05/27/22   Jonelle Sidle, MD  diltiazem (CARDIZEM CD) 180 MG 24 hr capsule Take 180 mg by mouth daily. 01/20/23   [provider]  diphenhydrAMINE (BENADRYL) 25 mg capsule Take 50 mg by mouth at bedtime.    [provider]  esomeprazole (NEXIUM) 40 MG capsule Take 40 mg by mouth 2 (two) times daily before a meal.    [provider]  FLUoxetine (PROZAC) 40 MG capsule Take 40 mg by mouth daily.    [provider]  formoterol (PERFOROMIST) 20 MCG/2ML nebulizer solution Take 2 mLs (20 mcg total) by nebulization 2 (two) times daily. 09/16/22   Cobb, Ruby Cola, NP  gabapentin (NEURONTIN) 300 MG capsule Take 900 mg by mouth 3 (three) times daily.    [provider]  insulin regular human CONCENTRATED (HUMULIN R) 500 UNIT/ML injection Inject 90 Units into the skin 3 (three) times daily.    [provider]  isosorbide mononitrate (IMDUR) 60 MG 24 hr tablet Take 1 tablet (60 mg total) by mouth in the morning. & 30 mg in the evening Patient taking differently: Take 60 mg by mouth in the morning. 03/27/22   Jonelle Sidle, MD  linagliptin (TRADJENTA) 5 MG TABS tablet Take 5 mg by mouth daily.    [provider]  midodrine (PROAMATINE) 2.5 MG tablet Take 1 tablet (2.5 mg total)  by mouth 2 (two) times daily. 08/11/22   Jonelle Sidle, MD  nitroGLYCERIN (NITROSTAT) 0.4 MG SL tablet Place 0.4 mg under the tongue every 5 (five) minutes as needed for chest pain.    [provider]  oxyCODONE-acetaminophen (PERCOCET) 10-325 MG tablet Take 1 tablet by mouth every 6 (six) hours as needed.    [provider]  potassium chloride SA (KLOR-CON M) 20 MEQ tablet Take 20 mEq by mouth 2 (two) times daily. 12/28/22   [provider]  pregabalin (LYRICA) 25 MG capsule Take 25 mg by mouth 2 (two) times daily. 02/14/19   [provider]   ranolazine (RANEXA) 500 MG 12 hr tablet Take 1 tablet (500 mg total) by mouth 2 (two) times daily. 08/27/22   Sharlene Dory, NP  revefenacin [facility]) 175 MCG/3ML nebulizer solution Take 3 mLs (175 mcg total) by nebulization daily. Patient taking differently: Take 175 mcg by nebulization daily as needed (Wheezing/SOB). 09/16/22   Cobb, Ruby Cola, NP  torsemide (DEMADEX) 20 MG tablet Take 60 mg by mouth 2 (two) times daily. 01/20/23   [provider]      Allergies    Lisinopril, Pollen extract, and Oxycodone    Review of Systems   Review of Systems  Constitutional:  Positive for chills. Negative for diaphoresis and fever.  Respiratory:  Positive for shortness of breath. Negative for cough.   Cardiovascular:  Positive for chest pain.  Gastrointestinal:  Positive for nausea. Negative for vomiting.  Skin:  Positive for rash.    Physical Exam Updated Vital Signs BP 134/83 (BP Location: Right Arm)   Pulse (!) 108   Temp 97.6 F (36.4 C) (Oral)   Resp (!) 33   Ht 5\' 9"  (1.753 m)   Wt 90.7 kg   SpO2 98%   BMI 29.53 kg/m  Physical Exam Vitals and nursing note reviewed.  Constitutional:      General: He is not in acute distress.    Appearance: He is well-developed.  HENT:     Head: Normocephalic and atraumatic.  Eyes:     Conjunctiva/sclera: Conjunctivae normal.  Cardiovascular:     Rate and Rhythm: Regular rhythm. Tachycardia present.     Heart sounds: Normal heart sounds. No murmur heard. Pulmonary:     Effort: Tachypnea and accessory muscle usage present. No respiratory distress.     Breath sounds: Normal breath sounds.  Abdominal:     Palpations: Abdomen is soft.     Tenderness: There is no abdominal tenderness.  Musculoskeletal:        General: No swelling. Normal range of motion.     Cervical back: Neck supple.     Right lower leg: Tenderness present. Edema present.     Comments: Erythema of his right foot with some spreading up his right lower leg.   Transmetatarsal amputation on left looks well-healed.  Skin:    General: Skin is warm and dry.     Capillary Refill: Capillary refill takes less than 2 seconds.  Neurological:     General: No focal deficit present.     Mental Status: He is alert.     ED Results / Procedures / Treatments   Labs (all labs ordered are listed, but only abnormal results are displayed) Labs Reviewed  LACTIC ACID, PLASMA - Abnormal; Notable for the following components:      Result Value   Lactic Acid, Venous 3.1 (*)    All other components within normal limits  COMPREHENSIVE METABOLIC  PANEL - Abnormal; Notable for the following components:   Sodium 128 (*)    Potassium 2.6 (*)    CO2 18 (*)    Glucose, Bld 172 (*)    Creatinine, Ser 1.39 (*)    Calcium 8.1 (*)    Albumin 3.2 (*)    AST 14 (*)    Total Bilirubin 1.5 (*)    GFR, Estimated 56 (*)    All other components within normal limits  CBC WITH DIFFERENTIAL/PLATELET - Abnormal; Notable for the following components:   WBC 22.2 (*)    Neutro Abs 19.5 (*)    Monocytes Absolute 1.2 (*)    Abs Immature Granulocytes 0.37 (*)    All other components within normal limits  BRAIN NATRIURETIC PEPTIDE - Abnormal; Notable for the following components:   B Natriuretic Peptide 367.0 (*)    All other components within normal limits  TROPONIN I (HIGH SENSITIVITY) - Abnormal; Notable for the following components:   Troponin I (High Sensitivity) 20 (*)    All other components within normal limits  CULTURE, BLOOD (ROUTINE X 2)  CULTURE, BLOOD (ROUTINE X 2)  LACTIC ACID, PLASMA  PROTIME-INR  APTT  URINALYSIS, W/ REFLEX TO CULTURE (INFECTION SUSPECTED)  TROPONIN I (HIGH SENSITIVITY)    EKG EKG Interpretation Date/Time:  Monday February 15 2023 11:29:51 EDT Ventricular Rate:  106 PR Interval:  114 QRS Duration:  104 QT Interval:  312 QTC Calculation: 415 R Axis:   -66  Text Interpretation: Sinus or ectopic atrial tachycardia Multiform ventricular  premature complexes Left anterior fascicular block Abnormal R-wave progression, late transition increased rate from prior 7/24 Confirmed by Meridee Score [phone]) on 02/15/2023 11:50:54 AM  Radiology CT Angio Chest/Abd/Pel for Dissection W and/or W/WO  Result Date: 02/15/2023 CLINICAL DATA:  Acute aortic syndrome suspected, chest pain EXAM: CT ANGIOGRAPHY CHEST, ABDOMEN AND PELVIS TECHNIQUE: Non-contrast CT of the chest was initially obtained. Multidetector CT imaging through the chest, abdomen and pelvis was performed using the standard protocol during bolus administration of intravenous contrast. Multiplanar reconstructed images and MIPs were obtained and reviewed to evaluate the vascular anatomy. RADIATION DOSE REDUCTION: This exam was performed according to the departmental dose-optimization program which includes automated exposure control, adjustment of the mA and/or kV according to patient size and/or use of iterative reconstruction technique. CONTRAST:  OMNIPAQUE IOHEXOL 350 MG/ML SOLN COMPARISON:  CT chest angiogram, 02/02/2023 FINDINGS: CTA CHEST FINDINGS VASCULAR Aorta: Satisfactory opacification of the aorta. Normal contour and caliber of the thoracic aorta. No evidence of aneurysm, dissection, or other acute aortic pathology. Severe aortic atherosclerosis. Cardiovascular: No evidence of pulmonary embolism on limited non-tailored examination. Normal heart size. Three-vessel coronary artery calcifications. No pericardial effusion. Review of the MIP images confirms the above findings. NON VASCULAR Mediastinum/Nodes: No enlarged mediastinal, hilar, or axillary lymph nodes. Thyroid gland, trachea, and esophagus demonstrate no significant findings. Lungs/Pleura: Mild centrilobular and paraseptal emphysema. Diffuse bilateral bronchial wall thickening. Unchanged appearance of the chest, with l multiple left-sided wedge resections with associated scarring and volume loss (series 7, image 91).  Musculoskeletal: No chest wall abnormality. No acute osseous findings. Review of the MIP images confirms the above findings. CTA ABDOMEN AND PELVIS FINDINGS VASCULAR Normal caliber of the abdominal aorta. Severe mixed aortic atherosclerosis with extensive irregular mural thrombus. No evidence of aneurysm, dissection, or other acute aortic pathology. Standard branching pattern of the abdominal aorta with solitary left renal artery. Right renal artery is surgically absent. Review of the MIP images confirms the  above findings. NON-VASCULAR Hepatobiliary: No focal liver abnormality is seen. Hepatic steatosis. Status post cholecystectomy. No biliary dilatation. Pancreas: Unremarkable. No pancreatic ductal dilatation or surrounding inflammatory changes. Spleen: Normal in size without significant abnormality. Adrenals/Urinary Tract: Adrenal glands are unremarkable. Status post right nephrectomy. The left kidney is normal, without renal calculi, solid lesion, or hydronephrosis. Bladder is unremarkable. Stomach/Bowel: Stomach is within normal limits. Appendix not clearly visualized. No evidence of bowel wall thickening, distention, or inflammatory changes. Colon is fluid-filled to the rectum. Lymphatic: No enlarged abdominal or pelvic lymph nodes. Reproductive: Prostatomegaly. Other: Fat containing right inguinal hernia.  No ascites. Musculoskeletal: No acute osseous findings. IMPRESSION: 1. No evidence of aortic aneurysm, dissection, or other acute aortic pathology. 2. Severe mixed aortic atherosclerosis with extensive irregular mural thrombus. 3. Colon is fluid-filled to the rectum, suggestive of diarrheal illness. 4. Emphysema and diffuse bilateral bronchial wall thickening. 5. Unchanged appearance of the chest, with multiple left-sided wedge resections with associated scarring and volume loss. 6. Status post right nephrectomy. 7. Hepatic steatosis. 8. Prostatomegaly. 9. Coronary artery disease. Aortic Atherosclerosis  (ICD10-I70.0) and Emphysema (ICD10-J43.9). Electronically Signed   By: Jearld Lesch M.D.   On: 02/15/2023 16:20   DG Chest Port 1 View  Result Date: 02/15/2023 CLINICAL DATA:  Questionable sepsis, evaluate for abnormality. Chest pain since last night. EXAM: PORTABLE CHEST 1 VIEW COMPARISON:  Radiographs 02/02/2023 and 01/16/2023.  CTA 02/02/2023. FINDINGS: 1212 hours. The heart size and mediastinal contours are stable with aortic atherosclerosis. There is stable chronic volume loss in the left hemithorax with associated left lung scarring and rounded atelectasis on CT. The right lung appears clear. No evidence of acute superimposed airspace disease, pleural effusion or pneumothorax. Previous left shoulder arthroplasty noted. Telemetry leads overlie the chest. IMPRESSION: Stable chronic findings in the left hemithorax. No evidence of acute superimposed process. Electronically Signed   By: Carey Bullocks M.D.   On: 02/15/2023 13:07    Procedures .Critical Care  Performed by: Terrilee Files, MD Authorized by: Terrilee Files, MD   Critical care provider statement:    Critical care time (minutes):  45   Critical care time was exclusive of:  Separately billable procedures and treating other patients   Critical care was necessary to treat or prevent imminent or life-threatening deterioration of the following conditions:  Sepsis   Critical care was time spent personally by me on the following activities:  Development of treatment plan with patient or surrogate, discussions with consultants, evaluation of patient's response to treatment, examination of patient, obtaining history from patient or surrogate, ordering and performing treatments and interventions, ordering and review of laboratory studies, ordering and review of radiographic studies, pulse oximetry, re-evaluation of patient's condition and review of old charts   I assumed direction of critical care for this patient from another provider in  my specialty: no       Medications Ordered in ED Medications  potassium chloride 10 mEq in 100 mL IVPB (10 mEq Intravenous New Bag/Given 02/15/23 1659)  cefTRIAXone (ROCEPHIN) 1 g in sodium chloride 0.9 % 100 mL IVPB (0 g Intravenous Stopped 02/15/23 1238)  acetaminophen (TYLENOL) tablet 650 mg (650 mg Oral Given 02/15/23 1210)  sodium chloride 0.9 % bolus 1,000 mL (0 mLs Intravenous Stopped 02/15/23 1424)  iohexol (OMNIPAQUE) 350 MG/ML injection 100 mL (100 mLs Intravenous Contrast Given 02/15/23 1523)  sodium chloride 0.9 % bolus 500 mL (0 mLs Intravenous Stopped 02/15/23 1702)  sodium chloride 0.9 % bolus 500 mL (500 mLs  Intravenous Bolus 02/15/23 1701)    ED Course/ Medical Decision Making/ A&P Clinical Course as of 02/15/23 1743  Mon Feb 15, 2023  1220 Chest x-ray interpreted by me as worsening aeration on left side.  Awaiting radiology reading. [MB]  1347 Patient states he still feels about the same.  A little short of breath and still having some chest discomfort.  He is on his baseline 6 L satting 100% on room air although is tachypneic with increased work of breathing.  Getting IV potassium.  Will put him in for CT chest abdomen due to his ongoing symptoms. [MB]    Clinical Course User Index [MB] Terrilee Files, MD                             Medical Decision Making Amount and/or Complexity of Data Reviewed Labs: ordered. Radiology: ordered. ECG/medicine tests: ordered.  Risk OTC drugs. Prescription drug management. Decision regarding hospitalization.   This patient complains of chest pain shortness of breath fever or right lower extremity cellulitis; this involves an extensive number of treatment Options and is a complaint that carries with it a high risk of complications and morbidity. The differential includes sepsis, Sirs, pneumonia, PE, pneumothorax, infectious diarrhea, cellulitis, vascular  I ordered, reviewed and interpreted labs, which included CBC with  elevated white count stable hemoglobin, chemistries with low sodium low potassium elevated creatinine, lactate elevated cleared, troponins flat, BNP elevated I ordered medication IV antibiotics IV fluids IV potassium and reviewed PMP when indicated. I ordered imaging studies which included chest x-ray and CT angio chest abdomen and pelvis and I independently    visualized and interpreted imaging which showed multiple opacities left lung Previous records obtained and reviewed in epic including recent discharge summary admitted for sepsis few weeks ago Cardiac monitoring reviewed, sinus rhythm Social determinants considered, ongoing tobacco use Critical Interventions: Initiation of antibiotics and fluids for patient's sepsis criteria  After the interventions stated above, I reevaluated the patient and found patient still to be dyspneic and uncomfortable appearing Admission and further testing considered, his care is signed out to Dr. Estell Harpin to follow-up on final reading of CT angio chest abdomen and pelvis.  Will need admission to the hospital for further management.         Final Clinical Impression(s) / ED Diagnoses Final diagnoses:  Cellulitis of right leg  Acute sepsis ([name])  Hypokalemia    Rx / DC Orders ED Discharge Orders     None         Terrilee Files, MD 02/15/23 1747

## 2023-02-15 NOTE — Assessment & Plan Note (Signed)
Continue statin. 

## 2023-02-15 NOTE — Assessment & Plan Note (Signed)
-   Erythema and tenderness of right lower extremity - Meet SIRS criteria - Rocephin started in the ED - Continue Rocephin - Continue to monitor

## 2023-02-15 NOTE — Assessment & Plan Note (Signed)
-   Potassium down to 2.6 - Continue PTA potassium supplementation - Patient was given 40 mEq of potassium in the ED - Trend at 10 PM with BMP - Continue to monitor

## 2023-02-15 NOTE — Progress Notes (Signed)
Received report from Darl Pikes, RN in ED. Waiting on pt to arrive to unit.

## 2023-02-15 NOTE — Assessment & Plan Note (Signed)
-   Continue Lyrica - Secondary to diabetes - Continue to monitor

## 2023-02-15 NOTE — Assessment & Plan Note (Signed)
-   As seen on CTA chest - Consulted cardiology who advised to continue patient's Plavix but no need for anticoagulation as it would increase the risk of embolic events to the lower extremities - Continue to monitor

## 2023-02-15 NOTE — Assessment & Plan Note (Signed)
-   Sodium down to 128 - Was 139 12 days ago - Bolused in the ED - Trend at 10 PM - Continue IV fluids - Continue to monitor

## 2023-02-15 NOTE — ED Notes (Signed)
Pt had bm. Incontinent care provided.

## 2023-02-15 NOTE — Assessment & Plan Note (Signed)
-   Continue Xanax and Prozac

## 2023-02-15 NOTE — Assessment & Plan Note (Signed)
Continue Protonix °

## 2023-02-15 NOTE — Progress Notes (Signed)
Pt stated he couldn't remember the last time he urinated, and that he felt no urge to. Bladder scan =1035, MD notified, subsequent I&O =1225. Pt commented that he'd had a foley for 58yrs when he was on hospice before. After discharge from hospice, pt I&O'd himself. Eventually he needed a stent, after which he stated he had been able to make urine normally, until today.  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

## 2023-02-15 NOTE — Assessment & Plan Note (Signed)
-   Holding Cardizem due to soft pressure 99/71 in the ED - Continue to monitor

## 2023-02-15 NOTE — H&P (Signed)
History and Physical    Patient: [name] HQI:[id number] DOB: 11/10/1955 DOA: 02/15/2023 DOS: the patient was seen and examined on 02/15/2023 PCP: [name], [name], FNP  Patient coming from: Home  Chief Complaint:  Chief Complaint  Patient presents with   Chest Pain    Started last night   HPI: HUGHEY STREHLE is a 67 y.o. 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  Patient reports he had diarrhea at least 5 times last 5 hours.  He actually has an incontinent episode during my exam.  He denies any hematochezia or melena.  Patient reports he has not felt feverish although he did have a fever when he came into the ER.  He has had chest pain.  He describes his pain in the center of his chest and radiating left.  It feels like a heaviness.  It feels similar to when he had a MI in the past.  His troponin in the ER is actually downtrending from 20-17.  Patient reports he has not felt any increase in shortness of breath.  He is on 6 L nasal cannula at baseline.  He has not had any dizziness, malaise, fatigue.  He denies any cough.  Patient does report he has had urinary retention.  He has not had any urine output for the entire day.  He reports prior to that he was not having any dysuria.  Patient reports that yesterday his neighbor noticed that his right lower extremity was swollen.  Patient reports that it had felt like needles and cold.  He attributed it to his neuropathy.  It got worse throughout the night.  The bottom of the foot started hurting badly.  Patient reports that this was not normal for him.  He did not consider that it may be infected.  Patient reports that he takes Lyrica at  home and has not had a "neuropathy flare" in quite some time.  His Lyrica was not helping last night.  In regards to patient's diarrhea, patient reports not having been on any antibiotics recently.  He has had no abdominal pain.  Nobody else around him is having diarrhea.  Patient has no other complaints at this time.  Patient does currently still smoke about a pack per week.  He he does not drink.  He does not use illicit drugs.  Patient is DNR. Review of Systems: As mentioned in the history of present illness. All other systems reviewed and are negative. Past Medical History:  Diagnosis Date   Anxiety    Arthritis    Cholecystitis, acute 12/20/2013   Lap chole 6/5   Cocaine abuse ([name])    Community acquired pneumonia 05/31/2018   COPD (chronic obstructive pulmonary disease) ([name])    Coronary atherosclerosis of native coronary artery    a. 03/09/2013 Cath/PCI: LM nl, LAD: 50p, 26m (2.5x16 promus DES), LCX nl, OM1 min irregs, LPL/LPDA diff dzs, RCA nondom, mod diff dzs, EF 55%.   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 ([name]) 04/24/2021   History of nephrolithiasis    History of  pneumonia    History of stroke    Right MCA distribution, residual left-sided weakness   Hyperlipidemia    Metabolic encephalopathy    Noncompliance    Osteomyelitis of second toe of left foot ([name]) 04/24/2021   Renal cell carcinoma ([name])    Status post radical right nephrectomy August 2015   Respiratory failure with hypoxia ([name])    Sepsis ([name]) 05/19/2019   Sleep apnea    On CPAP, 4L O2 no cpap at home yet   Type 2 diabetes mellitus ([name]) 2011   Past Surgical History:  Procedure Laterality Date   ABDOMINAL AORTOGRAM W/LOWER EXTREMITY Left 07/17/2021   Procedure: ABDOMINAL AORTOGRAM W/LOWER EXTREMITY;  Surgeon: Nada Libman, MD;  Location: [facility] INVASIVE [facility];   Service: Cardiovascular;  Laterality: Left;   AMPUTATION Left 12/27/2020   Procedure: PARTIAL FIRST RAY AMPUTATION OF FOOT;  Surgeon: Candelaria Stagers, DPM;  Location: [facility] OR;  Service: Podiatry;  Laterality: Left;   APPENDECTOMY  1970's   CHOLECYSTECTOMY N/A 12/22/2013   Procedure: LAPAROSCOPIC CHOLECYSTECTOMY ;  Surgeon: Shelly Rubenstein, MD;  Location: [facility] OR;  Service: General;  Laterality: N/A;   ENDARTERECTOMY Right 10/08/2014   Procedure: Right ENDARTERECTOMY CAROTID;  Surgeon: Larina Earthly, MD;  Location: [facility] OR;  Service: Vascular;  Laterality: Right;   LAPAROSCOPIC LYSIS OF ADHESIONS  02/21/2014   Procedure: LAPAROSCOPIC LYSIS OF ADHESIONS EXTINSIVE;  Surgeon: Sebastian Ache, MD;  Location: [facility] ORS;  Service: Urology;;   LEFT HEART CATHETERIZATION WITH CORONARY ANGIOGRAM N/A 06/02/2012   Procedure: LEFT HEART CATHETERIZATION WITH CORONARY ANGIOGRAM;  Surgeon: Herby Abraham, MD;  Location: [facility] CATH LAB;  Service: Cardiovascular;  Laterality: N/A;   LEFT HEART CATHETERIZATION WITH CORONARY ANGIOGRAM N/A 03/09/2013   Procedure: LEFT HEART CATHETERIZATION WITH CORONARY ANGIOGRAM;  Surgeon: Tonny Bollman, MD;  Location: [facility] CATH LAB;  Service: Cardiovascular;  Laterality: N/A;   LEFT HEART CATHETERIZATION WITH CORONARY ANGIOGRAM N/A 12/20/2013   Procedure: LEFT HEART CATHETERIZATION WITH CORONARY ANGIOGRAM;  Surgeon: Kathleene Hazel, MD;  Location: [facility] CATH LAB;  Service: Cardiovascular;  Laterality: N/A;   LUNG BIOPSY Left 05/18/2014   Procedure: LUNG BIOPSY left upper lobe & left lower lobe;  Surgeon: Loreli Slot, MD;  Location: [facility] OR;  Service: Thoracic;  Laterality: Left;   PATCH ANGIOPLASTY Right 10/08/2014   Procedure: PATCH ANGIOPLASTY Right Carotid;  Surgeon: Larina Earthly, MD;  Location: [facility] OR;  Service: Vascular;  Laterality: Right;   PERIPHERAL VASCULAR INTERVENTION Left 07/17/2021   Procedure: PERIPHERAL VASCULAR INTERVENTION;  Surgeon: Nada Libman, MD;  Location: [facility]  INVASIVE CV LAB;  Service: Cardiovascular;  Laterality: Left;  Superficial Femoral Artery   REVERSE SHOULDER ARTHROPLASTY Left 01/13/2023   Procedure: OPEN REDUCTION OF CHRONIC SHOULDER DISLOCATION, OPEN REDUCTION INTERNAL FIXATION OF TUBULAR FRACTURE, LEFT SHOULDER HEMIPLASTY, LATERJET AUGMENTATION;  Surgeon: Bjorn Pippin, MD;  Location: [facility] ORS;  Service: Orthopedics;  Laterality: Left;   ROBOT ASSISTED LAPAROSCOPIC NEPHRECTOMY Right 02/21/2014   Procedure: ROBOTIC ASSISTED LAPAROSCOPIC RIGHT NEPHRECTOMY ;  Surgeon: Sebastian Ache, MD;  Location: [facility] ORS;  Service: Urology;  Laterality: Right;   VIDEO ASSISTED THORACOSCOPY Left 05/18/2014   Procedure: LEFT VIDEO ASSISTED THORACOSCOPY;  Surgeon: Loreli Slot, MD;  Location: [facility] OR;  Service: Thoracic;  Laterality: Left;   VIDEO BRONCHOSCOPY  05/18/2014   Procedure: VIDEO BRONCHOSCOPY;  Surgeon: Loreli Slot, MD;  Location: [facility] OR;  Service: Thoracic;;   Social History:  reports that he has been smoking  cigarettes. He started smoking about 50 years ago. He has a 50.8 pack-year smoking history. He has never been exposed to tobacco smoke. He has never used smokeless tobacco. He reports that he does not drink alcohol and does not use drugs.  Allergies  Allergen Reactions   Lisinopril Other (See Comments)    Other reaction(s): worsening kidney function   Pollen Extract     Other reaction(s): sneezing   Oxycodone Itching and Nausea Only    Pt is able to tolerate if taken with benadryl    Family History  Problem Relation Age of Onset   Cancer Mother        Thyroid - living in her 53's.   Hypertension Other    Diabetes Other    Stroke Other    Lung cancer Father    CAD Father    Cancer Maternal Grandmother        Breast   Cancer Maternal Grandfather        Throat and stomach   CAD Brother     Prior to Admission medications   Medication Sig Start Date End Date Taking? Authorizing Provider  ALPRAZolam Prudy Feeler) 1 MG tablet Take  1 mg by mouth 3 (three) times daily. 01/07/21  Yes [provider]  atorvastatin (LIPITOR) 20 MG tablet Take 20 mg by mouth at bedtime. 04/26/18  Yes [provider]  benzonatate (TESSALON) 200 MG capsule Take 200 mg by mouth 3 (three) times daily as needed for cough.   Yes [provider]  clopidogrel (PLAVIX) 75 MG tablet TAKE ONE TABLET BY MOUTH EVERY DAY 05/27/22  Yes Jonelle Sidle, MD  diltiazem (CARDIZEM CD) 180 MG 24 hr capsule Take 180 mg by mouth daily. 01/20/23  Yes [provider]  diphenhydrAMINE (BENADRYL) 25 mg capsule Take 50 mg by mouth at bedtime.   Yes [provider]  esomeprazole (NEXIUM) 40 MG capsule Take 40 mg by mouth 2 (two) times daily before a meal.   Yes [provider]  FLUoxetine (PROZAC) 40 MG capsule Take 40 mg by mouth daily.   Yes [provider]  insulin regular human CONCENTRATED (HUMULIN R) 500 UNIT/ML injection Inject 90 Units into the skin 3 (three) times daily.   Yes [provider]  isosorbide mononitrate (IMDUR) 60 MG 24 hr tablet Take 1 tablet (60 mg total) by mouth in the morning. & 30 mg in the evening Patient taking differently: Take 60 mg by mouth in the morning. 03/27/22  Yes Jonelle Sidle, MD  linagliptin (TRADJENTA) 5 MG TABS tablet Take 5 mg by mouth daily.   Yes [provider]  midodrine (PROAMATINE) 2.5 MG tablet Take 1 tablet (2.5 mg total) by mouth 2 (two) times daily. 08/11/22  Yes Jonelle Sidle, MD  nitroGLYCERIN (NITROSTAT) 0.4 MG SL tablet Place 0.4 mg under the tongue every 5 (five) minutes as needed for chest pain.   Yes [provider]  ondansetron (ZOFRAN) 4 MG tablet Take 4 mg by mouth every 8 (eight) hours as needed for nausea or vomiting. 01/19/23  Yes [provider]  oxyCODONE-acetaminophen (PERCOCET) 10-325 MG tablet Take 1 tablet by mouth every 6 (six) hours as needed.   Yes [provider]  potassium chloride SA  (KLOR-CON M) 20 MEQ tablet Take 20 mEq by mouth 2 (two) times daily. 12/28/22  Yes [provider]  pregabalin (LYRICA) 25 MG capsule Take 25 mg by mouth 2 (two) times daily. 02/14/19  Yes [provider]  ranolazine (RANEXA) 500 MG 12 hr tablet Take 1 tablet (500 mg total) by mouth 2 (two) times daily. 08/27/22  Yes Sharlene Dory, NP  torsemide (DEMADEX) 20 MG tablet Take 60 mg by mouth 2 (two) times daily. 01/20/23  Yes [provider]  zolpidem (AMBIEN) 5 MG tablet Take 5 mg by mouth at bedtime as needed for sleep. 01/28/23  Yes [provider]  insulin aspart (NOVOLOG FLEXPEN) 100 UNIT/ML FlexPen 30 units 5-10 minutes before meals Subcutaneous three times a day for 30 days    [provider]    Physical Exam: Vitals:   02/15/23 1730 02/15/23 1815 02/15/23 1900 02/15/23 1955  BP: 108/88 115/77 135/85   Pulse: 87 87    Resp: (!) 34 (!) 26 (!) 22   Temp:  98.3 F (36.8 C) 98.7 F (37.1 C)   TempSrc:  Oral Oral   SpO2: 98% 95% 100% 100%  Weight:   91.9 kg   Height:   5\' 9"  (1.753 m)    1.  General: Patient lying supine in bed,  no acute distress   2. Psychiatric: Alert and oriented x 3, mood and behavior normal for situation, pleasant and cooperative with exam   3. Neurologic: Speech and language are normal, face is symmetric, moves all 4 extremities voluntarily, at baseline without acute deficits on limited exam   4. HEENMT:  Head is atraumatic, normocephalic, pupils reactive to light, neck is supple, trachea is midline, mucous membranes are moist   5. Respiratory : Lungs are clear to auscultation bilaterally without wheezing, rhonchi, rales, no cyanosis, no increase in work of breathing or accessory muscle use   6. Cardiovascular : Heart rate normal, rhythm is regular, murmur present rubs or gallops, minimal edema right lower extremity, peripheral pulses palpated   7. Gastrointestinal:  Abdomen is soft, nondistended, nontender to  palpation bowel sounds active, no masses or organomegaly palpated   8. Skin:  Skin is warm, dry and intact without rashes, acute lesions, or ulcers on limited exam   9.Musculoskeletal:  Toes amputated on left, right foot is erythematous, edematous, tender to palpation, peripheral pulses palpated,  Data Reviewed: In the ED Patient was febrile, tachycardic, tachypneic with soft blood pressure. Patient had leukocytosis at 22.2 Patient was hyponatremic, hypokalemic Lactic acid initially 3.1 and then 1.7 BNP 367 Downtrend[address] Trope 20, 17 Patient started on Rocephin and given Tylenol 2 L bolus in the ED CTA chest showed an extensive irregular mural thrombus and fluid-filled colon Admission was requested for possible cellulitis Assessment and Plan: * Sepsis due to cellulitis ([name]) - Temp 101.4, heart rate 108, respiratory rate 33, leukocytosis at 22.2, lactic acidosis at 3.1 - Physical exam reveals an erythematous and tender right foot - Rocephin started in ED - Continue Rocephin - Tylenol 650 mg given in the ED - Continue Tylenol for fever - Patient was given 2 L bolus in the ED - Continue IV fluids at 75 mill per hour - Continue to monitor  Hypokalemia - Potassium down to 2.6 - Continue PTA potassium supplementation - Patient was given 40 mEq of potassium in the ED - Trend at 10 PM with BMP - Continue to monitor  Aortic mural thrombus ([name]) - As seen on CTA chest - Consulted cardiology who advised to continue patient's Plavix but no need for anticoagulation as it would increase the risk of embolic events to the lower extremities - Continue to monitor  Cellulitis in diabetic foot ([name]) - Erythema  and tenderness of right lower extremity - Meet SIRS criteria - Rocephin started in the ED - Continue Rocephin - Continue to monitor  Peripheral neuropathy - Continue Lyrica - Secondary to diabetes - Continue to monitor  GERD without esophagitis - Continue  Protonix  Smoking - Continues to smoke while on 6 L nasal cannula - Counseled on the importance of cessation especially with regards to fire safety - Patient reports he is down to a pack per week - Declines nicotine patch at this time - Continue to monitor  Mixed hyperlipidemia - Continue statin  Anxiety state - Continue Xanax and Prozac  Essential hypertension - Holding Cardizem due to soft pressure 99/71 in the ED - Continue to monitor  Hyponatremia - Sodium down to 128 - Was 139 12 days ago - Bolused in the ED - Trend at 10 PM - Continue IV fluids - Continue to monitor  Type 2 diabetes mellitus ([name]) - Heart healthy/carb modified diet - Monitor CBGs - 90 units of basal insulin at baseline - Reduce to 20 units of basal insulin here - Sliding scale coverage - Continue to monitor      Advance Care Planning:   Code Status: DNR  Consults: None at this time  Family Communication: No family at bedside  Severity of Illness: The appropriate patient status for this patient is INPATIENT. Inpatient status is judged to be reasonable and necessary in order to provide the required intensity of service to ensure the patient's safety. The patient's presenting symptoms, physical exam findings, and initial radiographic and laboratory data in the context of their chronic comorbidities is felt to place them at high risk for further clinical deterioration. Furthermore, it is not anticipated that the patient will be medically stable for discharge from the hospital within 2 midnights of admission.   * I certify that at the point of admission it is my clinical judgment that the patient will require inpatient hospital care spanning beyond 2 midnights from the point of admission due to high intensity of service, high risk for further deterioration and high frequency of surveillance required.*  Author: Lilyan Gilford, DO 02/15/2023 10:01 PM  For on call review [email or web address].

## 2023-02-15 NOTE — Assessment & Plan Note (Signed)
-   Heart healthy/carb modified diet - Monitor CBGs - 90 units of basal insulin at baseline - Reduce to 20 units of basal insulin here - Sliding scale coverage - Continue to monitor

## 2023-02-15 NOTE — Assessment & Plan Note (Signed)
-   Continues to smoke while on 6 L nasal cannula - Counseled on the importance of cessation especially with regards to fire safety - Patient reports he is down to a pack per week - Declines nicotine patch at this time - Continue to monitor

## 2023-02-15 NOTE — ED Notes (Signed)
ED TO INPATIENT HANDOFF REPORT  ED Nurse Name and Phone #: [phone]  S Name/Age/Gender [name] 67 y.o. male Room/Bed: APA06/APA06  Code Status   Code Status: Prior  Home/SNF/Other Home Patient oriented to: self, place, time, and situation Is this baseline? Yes   Triage Complete: Triage complete  Chief Complaint Sepsis due to cellulitis ([name]) [L03.90, A41.9]  Triage Note Pt coming from home. Complains of chest pain that started last night. Localized to center of chest. Pt has a DNR. Pt had 2 nitroglycerin and 4 baby ASA. Surgery on left should 1 month ago.Hx of CHF, DM II, COPD per pt. O2 at 6 L/min via n/c at home. Pt right foot swollen and red with red streaks going up leg.    Allergies Allergies  Allergen Reactions   Lisinopril Other (See Comments)    Other reaction(s): worsening kidney function   Pollen Extract     Other reaction(s): sneezing   Oxycodone Itching and Nausea Only    Pt is able to tolerate if taken with benadryl    Level of Care/Admitting Diagnosis ED Disposition     ED Disposition  Admit   Condition  --   Comment  Hospital Area: [facility] [[id number]]  Level of Care: Telemetry [5]  Covid Evaluation: Asymptomatic - no recent exposure (last 10 days) testing not required  Diagnosis: Sepsis due to cellulitis [facility]) [[id number]]  Admitting Physician: Lilyan Gilford [[id number]]  Attending Physician: Lilyan Gilford [[id number]]  Certification:: I certify this patient will need inpatient services for at least 2 midnights  Estimated Length of Stay: 2          B Medical/Surgery History Past Medical History:  Diagnosis Date   Anxiety    Arthritis    Cholecystitis, acute 12/20/2013   Lap chole 6/5   Cocaine abuse ([name])    Community acquired pneumonia 05/31/2018   COPD (chronic obstructive pulmonary disease) ([name])    Coronary atherosclerosis of native coronary artery    a. 03/09/2013 Cath/PCI: LM nl, LAD: 50p, 23m (2.5x16  promus DES), LCX nl, OM1 min irregs, LPL/LPDA diff dzs, RCA nondom, mod diff dzs, EF 55%.   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 ([name]) 04/24/2021   History of nephrolithiasis    History of pneumonia    History of stroke    Right MCA distribution, residual left-sided weakness   Hyperlipidemia    Metabolic encephalopathy    Noncompliance    Osteomyelitis of second toe of left foot ([name]) 04/24/2021   Renal cell carcinoma ([name])    Status post radical right nephrectomy August 2015   Respiratory failure with hypoxia ([name])    Sepsis ([name]) 05/19/2019   Sleep apnea    On CPAP, 4L O2 no cpap at home yet   Type 2 diabetes mellitus ([name]) 2011   Past Surgical History:  Procedure Laterality Date   ABDOMINAL AORTOGRAM W/LOWER EXTREMITY Left 07/17/2021   Procedure: ABDOMINAL AORTOGRAM W/LOWER EXTREMITY;  Surgeon: Nada Libman, MD;  Location: [facility] INVASIVE CV LAB;  Service: Cardiovascular;  Laterality: Left;   AMPUTATION Left 12/27/2020   Procedure: PARTIAL FIRST RAY AMPUTATION OF FOOT;  Surgeon: Candelaria Stagers, DPM;  Location: [facility] OR;  Service: Podiatry;  Laterality: Left;   APPENDECTOMY  1970's   CHOLECYSTECTOMY N/A 12/22/2013   Procedure: LAPAROSCOPIC  CHOLECYSTECTOMY ;  Surgeon: Shelly Rubenstein, MD;  Location: [facility] OR;  Service: General;  Laterality: N/A;   ENDARTERECTOMY Right 10/08/2014   Procedure: Right ENDARTERECTOMY CAROTID;  Surgeon: Larina Earthly, MD;  Location: [facility] OR;  Service: Vascular;  Laterality: Right;   LAPAROSCOPIC LYSIS OF ADHESIONS  02/21/2014   Procedure: LAPAROSCOPIC LYSIS OF ADHESIONS EXTINSIVE;  Surgeon: Sebastian Ache, MD;  Location: [facility] ORS;  Service: Urology;;   LEFT HEART CATHETERIZATION WITH CORONARY ANGIOGRAM N/A 06/02/2012   Procedure: LEFT HEART CATHETERIZATION WITH CORONARY ANGIOGRAM;  Surgeon: Herby Abraham, MD;  Location: [facility]  CATH LAB;  Service: Cardiovascular;  Laterality: N/A;   LEFT HEART CATHETERIZATION WITH CORONARY ANGIOGRAM N/A 03/09/2013   Procedure: LEFT HEART CATHETERIZATION WITH CORONARY ANGIOGRAM;  Surgeon: Tonny Bollman, MD;  Location: [facility] CATH LAB;  Service: Cardiovascular;  Laterality: N/A;   LEFT HEART CATHETERIZATION WITH CORONARY ANGIOGRAM N/A 12/20/2013   Procedure: LEFT HEART CATHETERIZATION WITH CORONARY ANGIOGRAM;  Surgeon: Kathleene Hazel, MD;  Location: [facility] CATH LAB;  Service: Cardiovascular;  Laterality: N/A;   LUNG BIOPSY Left 05/18/2014   Procedure: LUNG BIOPSY left upper lobe & left lower lobe;  Surgeon: Loreli Slot, MD;  Location: [facility] OR;  Service: Thoracic;  Laterality: Left;   PATCH ANGIOPLASTY Right 10/08/2014   Procedure: PATCH ANGIOPLASTY Right Carotid;  Surgeon: Larina Earthly, MD;  Location: [facility] OR;  Service: Vascular;  Laterality: Right;   PERIPHERAL VASCULAR INTERVENTION Left 07/17/2021   Procedure: PERIPHERAL VASCULAR INTERVENTION;  Surgeon: Nada Libman, MD;  Location: [facility] CV LAB;  Service: Cardiovascular;  Laterality: Left;  Superficial Femoral Artery   REVERSE SHOULDER ARTHROPLASTY Left 01/13/2023   Procedure: OPEN REDUCTION OF CHRONIC SHOULDER DISLOCATION, OPEN REDUCTION INTERNAL FIXATION OF TUBULAR FRACTURE, LEFT SHOULDER HEMIPLASTY, LATERJET AUGMENTATION;  Surgeon: Bjorn Pippin, MD;  Location: [facility] ORS;  Service: Orthopedics;  Laterality: Left;   ROBOT ASSISTED LAPAROSCOPIC NEPHRECTOMY Right 02/21/2014   Procedure: ROBOTIC ASSISTED LAPAROSCOPIC RIGHT NEPHRECTOMY ;  Surgeon: Sebastian Ache, MD;  Location: [facility] ORS;  Service: Urology;  Laterality: Right;   VIDEO ASSISTED THORACOSCOPY Left 05/18/2014   Procedure: LEFT VIDEO ASSISTED THORACOSCOPY;  Surgeon: Loreli Slot, MD;  Location: [facility] OR;  Service: Thoracic;  Laterality: Left;   VIDEO BRONCHOSCOPY  05/18/2014   Procedure: VIDEO BRONCHOSCOPY;  Surgeon: Loreli Slot, MD;  Location: [facility] OR;  Service:  Thoracic;;     A IV Location/Drains/Wounds Patient Lines/Drains/Airways Status     Active Line/Drains/Airways     Name Placement date Placement time Site Days   Peripheral IV 02/15/23 20 G 1" Posterior;Right Forearm 02/15/23  1150  Forearm  less than 1   Peripheral IV 02/15/23 20 G 1" Anterior;Distal;Right;Upper Arm 02/15/23  1156  Arm  less than 1            Intake/Output Last 24 hours  Intake/Output Summary (Last 24 hours) at 02/15/2023 1736 Last data filed at 02/15/2023 1424 Gross per 24 hour  Intake 1099 ml  Output --  Net 1099 ml    Labs/Imaging Results for orders placed or performed during the hospital encounter of 02/15/23 (from the past 48 hour(s))  Lactic acid, plasma     Status: Abnormal   Collection Time: 02/15/23 12:00 PM  Result Value Ref Range   Lactic Acid, Venous 3.1 (HH) 0.5 - 1.9 mmol/L    Comment: CRITICAL RESULT CALLED TO, READ BACK BY AND VERIFIED WITH DANNA FOWLER 02/15/23 1236 NMN Performed at [facility], [address], [place], [place] [address]   Comprehensive metabolic panel     Status: Abnormal   Collection Time: 02/15/23 12:00 PM  Result Value Ref Range   Sodium 128 (L) 135 - 145 mmol/L   Potassium 2.6 (LL) 3.5 - 5.1 mmol/L    Comment: CRITICAL RESULT CALLED TO, READ BACK BY AND VERIFIED WITH DANNA FOWLER 1244 02/15/23 NMN   Chloride 102 98 - 111 mmol/L   CO2 18 (L) 22 - 32 mmol/L   Glucose, Bld 172 (H) 70 - 99 mg/dL    Comment: Glucose reference range applies only to samples taken after fasting for at least 8 hours.   BUN 17 8 - 23 mg/dL   Creatinine, Ser 2.70 (H) 0.61 - 1.24 mg/dL   Calcium 8.1 (L) 8.9 - 10.3 mg/dL   Total Protein 7.4 6.5 - 8.1 g/dL   Albumin 3.2 (L) 3.5 - 5.0 g/dL   AST 14 (L) 15 - 41 U/L   ALT 13 0 - 44 U/L   Alkaline Phosphatase 84 38 - 126 U/L   Total Bilirubin 1.5 (H) 0.3 - 1.2 mg/dL   GFR, Estimated 56 (L) >60 mL/min    Comment: (NOTE) Calculated using the CKD-EPI Creatinine Equation (2021)    Anion gap 8  5 - 15    Comment: Performed at [facility], [address], [place], [place] [address]  CBC with Differential     Status: Abnormal   Collection Time: 02/15/23 12:00 PM  Result Value Ref Range   WBC 22.2 (H) 4.0 - 10.5 K/uL   RBC 5.00 4.22 - 5.81 MIL/uL   Hemoglobin 13.3 13.0 - 17.0 g/dL   HCT 38.1 82.9 - 93.7 %   MCV 81.0 80.0 - 100.0 fL   MCH 26.6 26.0 - 34.0 pg   MCHC 32.8 30.0 - 36.0 g/dL   RDW 16.9 67.8 - 93.8 %   Platelets 197 150 - 400 K/uL   nRBC 0.0 0.0 - 0.2 %   Neutrophils Relative % 87 %   Neutro Abs 19.5 (H) 1.7 - 7.7 K/uL   Lymphocytes Relative 5 %   Lymphs Abs 1.1 0.7 - 4.0 K/uL   Monocytes Relative 6 %   Monocytes Absolute 1.2 (H) 0.1 - 1.0 K/uL   Eosinophils Relative 0 %   Eosinophils Absolute 0.0 0.0 - 0.5 K/uL   Basophils Relative 0 %   Basophils Absolute 0.0 0.0 - 0.1 K/uL   Immature Granulocytes 2 %   Abs Immature Granulocytes 0.37 (H) 0.00 - 0.07 K/uL    Comment: Performed at [facility], [address], [place], [place] [address]  Protime-INR     Status: None   Collection Time: 02/15/23 12:00 PM  Result Value Ref Range   Prothrombin Time 14.7 11.4 - 15.2 seconds   INR 1.1 0.8 - 1.2    Comment: (NOTE) INR goal varies based on device and disease states. Performed at [facility], [address] [place], [place] [address]   APTT     Status: None   Collection Time: 02/15/23 12:00 PM  Result Value Ref Range   aPTT 30 24 - 36 seconds    Comment: Performed at [facility], [address] [place], [place] [address]  Brain natriuretic peptide     Status: Abnormal   Collection Time: 02/15/23 12:00 PM  Result Value Ref Range   B Natriuretic Peptide 367.0 (H) 0.0 - 100.0 pg/mL    Comment: Performed at [facility], [address] [place],  [place] [address]  Troponin I (High Sensitivity)     Status: Abnormal   Collection Time: 02/15/23 12:00 PM  Result Value Ref Range   Troponin I (High Sensitivity) 20 (H) <18 ng/L    Comment: (NOTE) Elevated high  sensitivity troponin I (hsTnI) values and significant  changes across serial measurements may suggest ACS but many other  chronic and acute conditions are known to elevate hsTnI results.  Refer to the "Links" section for chest pain algorithms and additional  guidance. Performed at [facility], [address] [place], [place] [address]   Lactic acid, plasma     Status: None   Collection Time: 02/15/23  1:23 PM  Result Value Ref Range   Lactic Acid, Venous 1.7 0.5 - 1.9 mmol/L    Comment: Performed at [facility], [address] [place], [place] [address]  Troponin I (High Sensitivity)     Status: None   Collection Time: 02/15/23  1:51 PM  Result Value Ref Range   Troponin I (High Sensitivity) 17 <18 ng/L    Comment: (NOTE) Elevated high sensitivity troponin I (hsTnI) values and significant  changes across serial measurements may suggest ACS but many other  chronic and acute conditions are known to elevate hsTnI results.  Refer to the "Links" section for chest pain algorithms and additional  guidance. Performed at [facility], [address] [place], [place] [address]    CT Angio Chest/Abd/Pel for Dissection W and/or W/WO  Result Date: 02/15/2023 CLINICAL DATA:  Acute aortic syndrome suspected, chest pain EXAM: CT ANGIOGRAPHY CHEST, ABDOMEN AND PELVIS TECHNIQUE: Non-contrast CT of the chest was initially obtained. Multidetector CT imaging through the chest, abdomen and pelvis was performed using the standard protocol during bolus administration of intravenous contrast. Multiplanar reconstructed images and MIPs were obtained and reviewed to evaluate the vascular anatomy. RADIATION DOSE REDUCTION: This exam was performed according to the departmental dose-optimization program which includes automated exposure control, adjustment of the mA and/or kV according to patient size and/or use of iterative reconstruction technique. CONTRAST:  OMNIPAQUE IOHEXOL 350 MG/ML SOLN COMPARISON:  CT  chest angiogram, 02/02/2023 FINDINGS: CTA CHEST FINDINGS VASCULAR Aorta: Satisfactory opacification of the aorta. Normal contour and caliber of the thoracic aorta. No evidence of aneurysm, dissection, or other acute aortic pathology. Severe aortic atherosclerosis. Cardiovascular: No evidence of pulmonary embolism on limited non-tailored examination. Normal heart size. Three-vessel coronary artery calcifications. No pericardial effusion. Review of the MIP images confirms the above findings. NON VASCULAR Mediastinum/Nodes: No enlarged mediastinal, hilar, or axillary lymph nodes. Thyroid gland, trachea, and esophagus demonstrate no significant findings. Lungs/Pleura: Mild centrilobular and paraseptal emphysema. Diffuse bilateral bronchial wall thickening. Unchanged appearance of the chest, with l multiple left-sided wedge resections with associated scarring and volume loss (series 7, image 91). Musculoskeletal: No chest wall abnormality. No acute osseous findings. Review of the MIP images confirms the above findings. CTA ABDOMEN AND PELVIS FINDINGS VASCULAR Normal caliber of the abdominal aorta. Severe mixed aortic atherosclerosis with extensive irregular mural thrombus. No evidence of aneurysm, dissection, or other acute aortic pathology. Standard branching pattern of the abdominal aorta with solitary left renal artery. Right renal artery is surgically absent. Review of the MIP images confirms the above findings. NON-VASCULAR Hepatobiliary: No focal liver abnormality is seen. Hepatic steatosis. Status post cholecystectomy. No biliary dilatation. Pancreas: Unremarkable. No pancreatic ductal dilatation or surrounding inflammatory changes. Spleen: Normal in size without significant abnormality. Adrenals/Urinary Tract: Adrenal glands are unremarkable. Status post right nephrectomy. The left kidney is  normal, without renal calculi, solid lesion, or hydronephrosis. Bladder is unremarkable. Stomach/Bowel: Stomach is within  normal limits. Appendix not clearly visualized. No evidence of bowel wall thickening, distention, or inflammatory changes. Colon is fluid-filled to the rectum. Lymphatic: No enlarged abdominal or pelvic lymph nodes. Reproductive: Prostatomegaly. Other: Fat containing right inguinal hernia.  No ascites. Musculoskeletal: No acute osseous findings. IMPRESSION: 1. No evidence of aortic aneurysm, dissection, or other acute aortic pathology. 2. Severe mixed aortic atherosclerosis with extensive irregular mural thrombus. 3. Colon is fluid-filled to the rectum, suggestive of diarrheal illness. 4. Emphysema and diffuse bilateral bronchial wall thickening. 5. Unchanged appearance of the chest, with multiple left-sided wedge resections with associated scarring and volume loss. 6. Status post right nephrectomy. 7. Hepatic steatosis. 8. Prostatomegaly. 9. Coronary artery disease. Aortic Atherosclerosis (ICD10-I70.0) and Emphysema (ICD10-J43.9). Electronically Signed   By: Jearld Lesch M.D.   On: 02/15/2023 16:20   DG Chest Port 1 View  Result Date: 02/15/2023 CLINICAL DATA:  Questionable sepsis, evaluate for abnormality. Chest pain since last night. EXAM: PORTABLE CHEST 1 VIEW COMPARISON:  Radiographs 02/02/2023 and 01/16/2023.  CTA 02/02/2023. FINDINGS: 1212 hours. The heart size and mediastinal contours are stable with aortic atherosclerosis. There is stable chronic volume loss in the left hemithorax with associated left lung scarring and rounded atelectasis on CT. The right lung appears clear. No evidence of acute superimposed airspace disease, pleural effusion or pneumothorax. Previous left shoulder arthroplasty noted. Telemetry leads overlie the chest. IMPRESSION: Stable chronic findings in the left hemithorax. No evidence of acute superimposed process. Electronically Signed   By: Carey Bullocks M.D.   On: 02/15/2023 13:07    Pending Labs Unresulted Labs (From admission, onward)     Start     Ordered   02/15/23  1201  Urinalysis, w/ Reflex to Culture (Infection Suspected) -Urine, Clean Catch  (Undifferentiated presentation (screening labs and basic nursing orders))  ONCE - URGENT,   URGENT       Question:  Specimen Source  Answer:  Urine, Clean Catch   02/15/23 1200   02/15/23 1200  Blood Culture (routine x 2)  (Undifferentiated presentation (screening labs and basic nursing orders))  BLOOD CULTURE X 2,   STAT      02/15/23 1200            Vitals/Pain Today's Vitals   02/15/23 1544 02/15/23 1645 02/15/23 1704 02/15/23 1730  BP: 111/84 101/65  108/88  Pulse: 100 90  87  Resp: (!) 27 20  (!) 34  Temp: 98.5 F (36.9 C)  98.1 F (36.7 C)   TempSrc: Oral  Oral   SpO2: 99% 99%  98%  Weight:      Height:      PainSc:        Isolation Precautions No active isolations  Medications Medications  potassium chloride 10 mEq in 100 mL IVPB (10 mEq Intravenous New Bag/Given 02/15/23 1659)  cefTRIAXone (ROCEPHIN) 1 g in sodium chloride 0.9 % 100 mL IVPB (0 g Intravenous Stopped 02/15/23 1238)  acetaminophen (TYLENOL) tablet 650 mg (650 mg Oral Given 02/15/23 1210)  sodium chloride 0.9 % bolus 1,000 mL (0 mLs Intravenous Stopped 02/15/23 1424)  iohexol (OMNIPAQUE) 350 MG/ML injection 100 mL (100 mLs Intravenous Contrast Given 02/15/23 1523)  sodium chloride 0.9 % bolus 500 mL (0 mLs Intravenous Stopped 02/15/23 1702)  sodium chloride 0.9 % bolus 500 mL (500 mLs Intravenous Bolus 02/15/23 1701)    Mobility walks with device     Focused  Assessments Sepsis-right leg red and swollen -initial complaints of chest pain   R Recommendations: See Admitting Provider Note  Report given to:   Additional Notes: Sepsis-right leg

## 2023-02-16 ENCOUNTER — Inpatient Hospital Stay (HOSPITAL_COMMUNITY): Payer: 59

## 2023-02-16 DIAGNOSIS — A419 Sepsis, unspecified organism: Secondary | ICD-10-CM | POA: Diagnosis not present

## 2023-02-16 DIAGNOSIS — L0390 Cellulitis, unspecified: Secondary | ICD-10-CM

## 2023-02-16 LAB — GLUCOSE, CAPILLARY
Glucose-Capillary: 82 mg/dL (ref 70–99)
Glucose-Capillary: 146 mg/dL — ABNORMAL HIGH (ref 70–99)
Glucose-Capillary: 158 mg/dL — ABNORMAL HIGH (ref 70–99)
Glucose-Capillary: 112 mg/dL — ABNORMAL HIGH (ref 70–99)

## 2023-02-16 LAB — TROPONIN I (HIGH SENSITIVITY)
Troponin I (High Sensitivity): 13 ng/L (ref <18)
Troponin I (High Sensitivity): 13 ng/L (ref <18)

## 2023-02-16 LAB — MAGNESIUM: Magnesium: 1.1 mg/dL — ABNORMAL LOW (ref 1.7–2.4)

## 2023-02-16 MED ORDER — IPRATROPIUM-ALBUTEROL 0.5-2.5 (3) MG/3ML IN SOLN
3.0000 mL | Freq: Three times a day (TID) | RESPIRATORY_TRACT | Status: DC
  Administered 2023-02-17 – 2023-02-25 (×23): 3 mL via RESPIRATORY_TRACT
  Filled 2023-02-16 (×23): qty 3

## 2023-02-16 MED ORDER — IPRATROPIUM-ALBUTEROL 0.5-2.5 (3) MG/3ML IN SOLN
3.0000 mL | Freq: Four times a day (QID) | RESPIRATORY_TRACT | Status: DC
  Administered 2023-02-16: 3 mL via RESPIRATORY_TRACT
  Filled 2023-02-16: qty 3

## 2023-02-16 MED ORDER — NICOTINE 21 MG/24HR TD PT24
21.0000 mg | MEDICATED_PATCH | Freq: Every day | TRANSDERMAL | Status: DC
  Administered 2023-02-18: 21 mg via TRANSDERMAL
  Filled 2023-02-16 (×12): qty 1

## 2023-02-16 MED ORDER — MAGNESIUM SULFATE 4 GM/100ML IV SOLN
4.0000 g | Freq: Once | INTRAVENOUS | Status: AC
  Administered 2023-02-16: 4 g via INTRAVENOUS
  Filled 2023-02-16: qty 100

## 2023-02-16 MED ORDER — POTASSIUM CHLORIDE CRYS ER 20 MEQ PO TBCR
40.0000 meq | EXTENDED_RELEASE_TABLET | Freq: Once | ORAL | Status: AC
  Administered 2023-02-16: 40 meq via ORAL
  Filled 2023-02-16: qty 2

## 2023-02-16 MED ORDER — ALBUTEROL SULFATE (2.5 MG/3ML) 0.083% IN NEBU
2.5000 mg | INHALATION_SOLUTION | RESPIRATORY_TRACT | Status: DC | PRN
  Administered 2023-03-03 – 2023-03-04 (×2): 2.5 mg via RESPIRATORY_TRACT
  Filled 2023-02-16 (×2): qty 3

## 2023-02-16 MED ORDER — DM-GUAIFENESIN ER 30-600 MG PO TB12
1.0000 | ORAL_TABLET | Freq: Two times a day (BID) | ORAL | Status: DC
  Administered 2023-02-16 – 2023-03-02 (×28): 1 via ORAL
  Filled 2023-02-16 (×28): qty 1

## 2023-02-16 MED ORDER — TAMSULOSIN HCL 0.4 MG PO CAPS
0.4000 mg | ORAL_CAPSULE | Freq: Every day | ORAL | Status: DC
  Administered 2023-02-16 – 2023-03-03 (×16): 0.4 mg via ORAL
  Filled 2023-02-16 (×16): qty 1

## 2023-02-16 NOTE — Progress Notes (Signed)
PROGRESS NOTE   [name], is a 67 y.o. male, DOB - 10/21/1955, WUX:[id number]  Admit date - 02/15/2023   Admitting Physician Lilyan Gilford, DO  Outpatient Primary MD for the patient is [name], [name], FNP  LOS - 1  Chief Complaint  Patient presents with   Chest Pain    Started last night      Brief Narrative:   67 y.o. 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 -- Normal resting left ankle-brachial index. -Continue atorvastatin , Ranexa and Plavix--- outpatient follow-up with vascular surgeon advised -Patient with prior transmetatarsal amputation of the left foot  3) COPD/emphysema/tobacco abuse -CTA chest noted on admission -No acute exacerbation at this time -Hold off on steroids -Continue bronchodilators -Give nicotine patch   4)Hypokalemia/hypomagnesemia - Replace and recheck  5)Aortic mural thrombus ([name]) - As seen on CTA chest - Consulted cardiology who advised to continue patient's Plavix but no need for anticoagulation as it would increase the risk of embolic events to the lower extremities -Continue Ranexa and Plavix  6)Peripheral neuropathy - Continue Lyrica - Secondary to diabetes - Continue to monitor  7)GERD without  esophagitis - Continue Protonix  8) chronic hypoxic respiratory failure--- requiring 6 L of oxygen via nasal cannula which is his baseline -Risk of fire and death with oxygen use and smoking emphasized to patient  9)Anxiety state - Continue Xanax and Prozac  10)HTN - Holding Cardizem due to soft pressure 99/71 in the ED - Continue to monitor   11)DM2-----A1c is 9.3 reflecting uncontrolled diabetes with hyperglycemia PTA - 90 units of basal insulin at baseline - Reduce to 20 units of basal insulin here - Use Novolog/Humalog Sliding scale insulin with Accu-Cheks/Fingersticks as ordered   Status is: Inpatient   Disposition: The patient is from: Home              Anticipated d/c is to: Home              Anticipated d/c date is: 2 days              Patient currently is not medically stable to d/c. Barriers: Not Clinically Stable-   Code Status :  -  Code Status: DNR   Family Communication:   (patient is alert, awake and coherent)   DVT Prophylaxis  :   - SCDs  heparin injection 5,000 Units Start: 02/15/23 2200 SCDs Start: 02/15/23 1955   Lab Results  Component Value Date   PLT 162 02/16/2023    Inpatient Medications  Scheduled Meds:  ALPRAZolam  1 mg Oral TID   atorvastatin  20 mg Oral QHS   clopidogrel  75 mg Oral Daily   FLUoxetine  40 mg Oral Daily   heparin  5,000 Units Subcutaneous Q8H   insulin aspart  0-15 Units Subcutaneous TID WC   insulin aspart  0-5 Units Subcutaneous QHS   insulin  detemir  20 Units Subcutaneous QHS   isosorbide mononitrate  60 mg Oral Daily   And   isosorbide mononitrate  30 mg Oral QHS   midodrine  2.5 mg Oral BID   pantoprazole  40 mg Oral Daily   potassium chloride SA  20 mEq Oral BID   ranolazine  500 mg Oral BID   tamsulosin  0.4 mg Oral QPC supper   Continuous Infusions:  0.9 % NaCl with KCl 40 mEq / L Stopped (02/16/23 0836)   cefTRIAXone (ROCEPHIN)  IV Stopped (02/16/23 1344)   PRN Meds:.acetaminophen **OR** acetaminophen,  loperamide, morphine injection, ondansetron **OR** ondansetron (ZOFRAN) IV, oxyCODONE, zolpidem   Anti-infectives (From admission, onward)    Start     Dose/Rate Route Frequency Ordered Stop   02/16/23 1200  cefTRIAXone (ROCEPHIN) 1 g in sodium chloride 0.9 % 100 mL IVPB        1 g 200 mL/hr over 30 Minutes Intravenous Every 24 hours 02/15/23 1954 02/23/23 1159   02/15/23 1215  cefTRIAXone (ROCEPHIN) 1 g in sodium chloride 0.9 % 100 mL IVPB        1 g 200 mL/hr over 30 Minutes Intravenous  Once 02/15/23 1200 02/15/23 1238         Subjective: Gae Bon today has no fevers, no emesis,  - Patient complains of chest discomfort and dyspnea -EKG sinus rhythm without acute findings -CTA chest abdomen and pelvis without acute findings -Troponins requested  Objective: Vitals:   02/16/23 0444 02/16/23 0750 02/16/23 1356 02/16/23 1813  BP: 130/76 131/70 (!) 114/54 118/65  Pulse: 86 87 77 78  Resp:  16 20   Temp: 98 F (36.7 C) 98.8 F (37.1 C) 98.8 F (37.1 C) 98.7 F (37.1 C)  TempSrc:   Oral   SpO2: 98% 100% 100% 99%  Weight:      Height:        Intake/Output Summary (Last 24 hours) at 02/16/2023 1939 Last data filed at 02/16/2023 1528 Gross per 24 hour  Intake 1335.39 ml  Output 1685 ml  Net -349.61 ml   Filed Weights   02/15/23 1142 02/15/23 1900  Weight: 90.7 kg 91.9 kg   Physical Exam Gen:- Awake Alert, in no apparent distress  HEENT:- [place].AT, No sclera icterus Nose- [place] 6L/min Neck-Supple Neck,No JVD,.  Lungs-  CTAB , fair symmetrical air movement CV- S1, S2 normal, regular  Abd-  +ve B.Sounds, Abd Soft, No tenderness,    Extremity/Skin:- No  edema, pedal pulses present  Psych-affect is appropriate, oriented x3 Neuro-generalized weakness , no new focal deficits, no tremors MSK-left foot with transmetatarsal amputations, -Right lower extremity distally around the lower leg and foot with erythema, warmth and slight discomfort on palpation  Data Reviewed: I  have personally reviewed following labs and imaging studies  CBC: Recent Labs  Lab 02/15/23 1200 02/16/23 0431  WBC 22.2* 13.7*  NEUTROABS 19.5* 11.5*  HGB 13.3 10.5*  HCT 40.5 32.4*  MCV 81.0 82.7  PLT 197 162   Basic Metabolic Panel: Recent Labs  Lab 02/15/23 1200 02/15/23 2237 02/16/23 0431  NA 128* 134* 136  K 2.6* 2.5* 3.7  CL 102 108 111  CO2 18* 20* 21*  GLUCOSE 172* 203* 99  BUN 17 17 16   CREATININE 1.39* 1.23 1.15  CALCIUM 8.1* 7.3* 7.7*  MG  --  1.1* 1.3*   GFR: Estimated Creatinine Clearance: 69.8 mL/min (by C-G formula based on SCr of 1.15 mg/dL). Liver Function Tests: Recent Labs  Lab 02/15/23 1200 02/16/23 0431  AST 14* 9*  ALT 13 10  ALKPHOS 84 61  BILITOT 1.5* 0.3  PROT 7.4 5.7*  ALBUMIN 3.2* 2.4*   BNP (last 3 results) Recent Labs    09/25/22 1412  PROBNP 54.0   Recent Results (from the past 240 hour(s))  Blood Culture (routine x 2)     Status: None (Preliminary result)   Collection Time: 02/15/23 11:50 AM   Specimen: BLOOD  Result Value Ref Range Status   Specimen Description BLOOD BLOOD RIGHT WRIST  Final   Special Requests   Final    BOTTLES DRAWN AEROBIC AND ANAEROBIC Blood Culture adequate volume   Culture   Final    NO GROWTH < 24 HOURS Performed at [facility], [address] [place], [place] [address]    Report Status PENDING  Incomplete  Blood Culture (routine x 2)     Status: None (Preliminary result)   Collection Time: 02/15/23 11:55 AM   Specimen: BLOOD  Result Value Ref Range Status   Specimen Description BLOOD RIGHT ANTECUBITAL  Final   Special Requests   Final    BOTTLES DRAWN AEROBIC AND ANAEROBIC Blood Culture adequate volume   Culture   Final    NO GROWTH < 24 HOURS Performed at [facility], [address] [place], [place] [address]    Report Status PENDING  Incomplete     Radiology Studies: US ARTERIAL ABI (SCREENING LOWER EXTREMITY)  Result Date: 02/16/2023 CLINICAL DATA:  Cellulitis EXAM:  NONINVASIVE PHYSIOLOGIC VASCULAR STUDY OF BILATERAL LOWER EXTREMITIES TECHNIQUE: Evaluation of both lower extremities were performed at rest, including calculation of ankle-brachial indices with single level Doppler, pressure and pulse volume recording. COMPARISON:  None Available. FINDINGS: Right ABI:  0.69 Left ABI:  1.2 Right Lower Extremity: Abnormal monophasic arterial waveforms with post obstructive pattern. Left Lower Extremity:  Normal arterial waveforms at the ankle. 0.5-0.79 Moderate PAD IMPRESSION: 1. Abnormal resting right ankle-brachial index of 0.69 consistent with at least moderate underlying peripheral arterial disease. 2. Normal resting left ankle-brachial index. Signed, Sterling Big, MD, RPVI [facility] Radiology Electronically Signed   By: Malachy Moan M.D.   On: 02/16/2023 12:15   CT Angio Chest/Abd/Pel for Dissection W and/or W/WO  Result Date: 02/15/2023 CLINICAL DATA:  Acute aortic syndrome suspected, chest pain EXAM: CT ANGIOGRAPHY CHEST, ABDOMEN AND PELVIS TECHNIQUE: Non-contrast CT of the chest was initially obtained. Multidetector CT imaging through the chest, abdomen and pelvis was performed using the standard protocol during bolus administration of intravenous contrast. Multiplanar reconstructed images and MIPs were obtained and reviewed to evaluate the vascular anatomy. RADIATION DOSE REDUCTION: This exam was performed according to the departmental dose-optimization program which includes automated exposure control, adjustment of the mA and/or kV according to patient size and/or use of iterative reconstruction technique. CONTRAST:  OMNIPAQUE IOHEXOL 350 MG/ML SOLN COMPARISON:  CT chest angiogram, 02/02/2023 FINDINGS: CTA CHEST FINDINGS VASCULAR Aorta: Satisfactory opacification of the aorta. Normal contour and caliber of the thoracic aorta. No evidence of aneurysm, dissection, or other acute aortic pathology. Severe  aortic atherosclerosis. Cardiovascular: No evidence of pulmonary embolism on limited non-tailored examination. Normal heart size. Three-vessel coronary artery calcifications. No pericardial effusion. Review of the MIP images confirms the above findings. NON VASCULAR Mediastinum/Nodes: No enlarged mediastinal, hilar, or axillary lymph nodes. Thyroid gland, trachea, and esophagus demonstrate no significant findings. Lungs/Pleura: Mild centrilobular and paraseptal emphysema. Diffuse bilateral bronchial wall thickening. Unchanged appearance of the  chest, with l multiple left-sided wedge resections with associated scarring and volume loss (series 7, image 91). Musculoskeletal: No chest wall abnormality. No acute osseous findings. Review of the MIP images confirms the above findings. CTA ABDOMEN AND PELVIS FINDINGS VASCULAR Normal caliber of the abdominal aorta. Severe mixed aortic atherosclerosis with extensive irregular mural thrombus. No evidence of aneurysm, dissection, or other acute aortic pathology. Standard branching pattern of the abdominal aorta with solitary left renal artery. Right renal artery is surgically absent. Review of the MIP images confirms the above findings. NON-VASCULAR Hepatobiliary: No focal liver abnormality is seen. Hepatic steatosis. Status post cholecystectomy. No biliary dilatation. Pancreas: Unremarkable. No pancreatic ductal dilatation or surrounding inflammatory changes. Spleen: Normal in size without significant abnormality. Adrenals/Urinary Tract: Adrenal glands are unremarkable. Status post right nephrectomy. The left kidney is normal, without renal calculi, solid lesion, or hydronephrosis. Bladder is unremarkable. Stomach/Bowel: Stomach is within normal limits. Appendix not clearly visualized. No evidence of bowel wall thickening, distention, or inflammatory changes. Colon is fluid-filled to the rectum. Lymphatic: No enlarged abdominal or pelvic lymph nodes. Reproductive:  Prostatomegaly. Other: Fat containing right inguinal hernia.  No ascites. Musculoskeletal: No acute osseous findings. IMPRESSION: 1. No evidence of aortic aneurysm, dissection, or other acute aortic pathology. 2. Severe mixed aortic atherosclerosis with extensive irregular mural thrombus. 3. Colon is fluid-filled to the rectum, suggestive of diarrheal illness. 4. Emphysema and diffuse bilateral bronchial wall thickening. 5. Unchanged appearance of the chest, with multiple left-sided wedge resections with associated scarring and volume loss. 6. Status post right nephrectomy. 7. Hepatic steatosis. 8. Prostatomegaly. 9. Coronary artery disease. Aortic Atherosclerosis (ICD10-I70.0) and Emphysema (ICD10-J43.9). Electronically Signed   By: Jearld Lesch M.D.   On: 02/15/2023 16:20   DG Chest Port 1 View  Result Date: 02/15/2023 CLINICAL DATA:  Questionable sepsis, evaluate for abnormality. Chest pain since last night. EXAM: PORTABLE CHEST 1 VIEW COMPARISON:  Radiographs 02/02/2023 and 01/16/2023.  CTA 02/02/2023. FINDINGS: 1212 hours. The heart size and mediastinal contours are stable with aortic atherosclerosis. There is stable chronic volume loss in the left hemithorax with associated left lung scarring and rounded atelectasis on CT. The right lung appears clear. No evidence of acute superimposed airspace disease, pleural effusion or pneumothorax. Previous left shoulder arthroplasty noted. Telemetry leads overlie the chest. IMPRESSION: Stable chronic findings in the left hemithorax. No evidence of acute superimposed process. Electronically Signed   By: Carey Bullocks M.D.   On: 02/15/2023 13:07    Scheduled Meds:  ALPRAZolam  1 mg Oral TID   atorvastatin  20 mg Oral QHS   clopidogrel  75 mg Oral Daily   FLUoxetine  40 mg Oral Daily   heparin  5,000 Units Subcutaneous Q8H   insulin aspart  0-15 Units Subcutaneous TID WC   insulin aspart  0-5 Units Subcutaneous QHS   insulin detemir  20 Units Subcutaneous  QHS   isosorbide mononitrate  60 mg Oral Daily   And   isosorbide mononitrate  30 mg Oral QHS   midodrine  2.5 mg Oral BID   pantoprazole  40 mg Oral Daily   potassium chloride SA  20 mEq Oral BID   ranolazine  500 mg Oral BID   tamsulosin  0.4 mg Oral QPC supper   Continuous Infusions:  0.9 % NaCl with KCl 40 mEq / L Stopped (02/16/23 0836)   cefTRIAXone (ROCEPHIN)  IV Stopped (02/16/23 1344)    LOS: 1 day   Shon Hale M.D on 02/16/2023 at 7:39 PM  Go to [email or web address] - for contact info  [facility] - Office  [phone]  If 7PM-7AM, please contact night-coverage [email or web address] 02/16/2023, 7:39 PM

## 2023-02-16 NOTE — TOC Initial Note (Signed)
Transition of Care [facility]) - Initial/Assessment Note    Patient Details  Name: [name] MRN: [id number] Date of Birth: Jul 26, 1955  Transition of [facility]) CM/SW Contact:    Karn Cassis, LCSW Phone Number: 02/16/2023, 8:34 AM  Clinical Narrative: Pt admitted with sepsis secondary to cellulitis. Assessment completed due to high risk readmission score. Pt known to TOC from recent admission. He reports he is living with his mother and has an aid from 10-1 Monday through Friday. He is on 6L home O2 ([employer]). Pt has cane and walker at home, but said recently he has not been using either. He has transportation to appointments with his aid or a neighbor. Pt plans to return home when medically stable. TOC will follow.                   Expected Discharge Plan: Home/Self Care Barriers to Discharge: Continued Medical Work up   Patient Goals and CMS Choice Patient states their goals for this hospitalization and ongoing recovery are:: return home   Choice offered to / list presented to : Patient [place] ownership interest in [facility].provided to::  (n/a)    Expected Discharge Plan and Services In-house Referral: Clinical Social Work     Living arrangements for the past 2 months: Single Family Home                                      Prior Living Arrangements/Services Living arrangements for the past 2 months: Single Family Home Lives with:: Parents Patient language and need for interpreter reviewed:: Yes        Need for Family Participation in Patient Care: No (Comment)   Current home services: DME (cane, walker, O2) Criminal Activity/Legal Involvement Pertinent to Current Situation/Hospitalization: No - Comment as needed  Activities of Daily Living Home Assistive Devices/Equipment: Dentures (specify type), Shower chair with back, Raised toilet seat with rails, Grab bars in shower, Grab bars around toilet, Walker (specify type), Oxygen, CBG  Meter, Nebulizer, Wheelchair ADL Screening (condition at time of admission) Patient's cognitive ability adequate to safely complete daily activities?: Yes Is the patient deaf or have difficulty hearing?: No Does the patient have difficulty seeing, even when wearing glasses/contacts?: No Does the patient have difficulty concentrating, remembering, or making decisions?: No Patient able to express need for assistance with ADLs?: Yes Does the patient have difficulty dressing or bathing?: Yes Independently performs ADLs?: No Communication: Independent Dressing (OT): Needs assistance Is this a change from baseline?: Pre-admission baseline Grooming: Needs assistance Is this a change from baseline?: Pre-admission baseline Feeding: Independent Bathing: Needs assistance Is this a change from baseline?: Pre-admission baseline Toileting: Independent with device (comment) (uses walker) In/Out Bed: Independent with device (comment) Walks in Home: Independent with device (comment) Does the patient have difficulty walking or climbing stairs?: Yes Weakness of Legs: None Weakness of Arms/Hands: None  Permission Sought/Granted                  Emotional Assessment     Affect (typically observed): Appropriate Orientation: : Oriented to Self, Oriented to  Time, Oriented to Situation, Oriented to Place Alcohol / Substance Use: Not Applicable Psych Involvement: No (comment)  Admission diagnosis:  Hypokalemia [E87.6] Cellulitis of right leg [L03.115] Sepsis due to cellulitis ([name]) [L03.90, A41.9] Acute sepsis [facility]) [A41.9] Patient Active Problem List   Diagnosis Date Noted   Sepsis due  to cellulitis ([name]) 02/15/2023   Cellulitis in diabetic foot ([name]) 02/15/2023   Aortic mural thrombus ([name]) 02/15/2023   Acute metabolic encephalopathy 02/02/2023   Hypokalemia 02/02/2023   Humerus fracture 01/12/2023   Nausea 12/23/2022   Generalized weakness 12/22/2022   Oropharyngeal candidiasis  02/11/2022   Palliative care encounter 01/06/2022   Abnormal CT scan, bladder 01/06/2022   Single kidney 01/06/2022   Diarrhea 10/23/2021   Osteomyelitis ([name]) 07/15/2021   History of complete ray amputation of first toe of left foot ([name]) 04/24/2021   Osteomyelitis of second toe of left foot ([name]) 04/24/2021   Diabetic foot ulcer ([name]) 12/24/2020   Benign prostatic hyperplasia 11/08/2020   Body mass index (BMI) 38.0-38.9, adult 11/08/2020   Chronic diastolic heart failure ([name]) 11/08/2020   Chronic pain 11/08/2020   Diabetic peripheral neuropathy associated with type 2 diabetes mellitus ([name]) 11/08/2020   Diastolic dysfunction 11/08/2020   GERD without esophagitis 11/08/2020   Hearing loss 11/08/2020   Incontinence of feces 11/08/2020   Long term (current) use of insulin ([name]) 11/08/2020   Loss of appetite 11/08/2020   Nicotine dependence, unspecified, uncomplicated 11/08/2020   Peripheral neuropathy 11/08/2020   Personal history of other malignant neoplasm of kidney 11/08/2020   Severe hyperglycemia due to diabetes mellitus ([name]) 11/08/2020   Urinary tract obstruction 11/08/2020   Asymptomatic varicose veins of bilateral lower extremities 11/08/2020   Chronic respiratory failure with hypoxia ([name]) 12/23/2018   UTI symptoms 11/08/2018   Abdominal distention 11/08/2018   Chest pain 11/08/2018   Hyperglycemia due to diabetes mellitus ([name]) 11/08/2018   Polypharmacy 06/03/2018   Diabetes mellitus due to underlying condition, uncontrolled, with hyperglycemia, with long-term current use of insulin ([name]) 06/03/2018   Decreased mobility and endurance 07/19/2015   CKD (chronic kidney disease) stage 3, GFR 30-59 ml/min ([name]) 07/06/2015   Interstitial lung disease ([name]) 04/10/2015   Cough syncope 04/10/2015   Dyspnea 04/10/2015   Atypical chest pain 02/28/2015   Syncope 02/28/2015   Orthostatic dizziness 02/25/2015   Right leg pain 02/25/2015   Right calf pain 02/14/2015   Malaise  and fatigue 02/14/2015   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[name]) 11/12/2014   Smoking 11/12/2014   History of stroke 10/24/2014   Carotid disease, bilateral ([name]) 10/24/2014   Orthostatic hypotension 10/24/2014   Obstructive sleep apnea    Mixed hyperlipidemia    Acute kidney injury superimposed on CKD ([name])    Renal cell carcinoma ([name])    Depression    Anxiety state    Noncompliance with medication regimen    Carotid artery stenosis 10/08/2014   Dizziness 09/26/2014   Left sided numbness 09/26/2014   Hyperreflexia 09/26/2014   Essential hypertension 09/26/2014   COPD (chronic obstructive pulmonary disease) ([name]) 08/16/2014   ILD (interstitial lung disease) ([name]) 08/16/2014   Dizziness and giddiness 08/16/2014   Other specified hypotension 08/16/2014   Snoring 04/30/2014   Fluid overload 02/22/2014   Renal neoplasm 02/21/2014   Preoperative cardiovascular examination 02/07/2014   PNA (pneumonia) 12/27/2013   Hyponatremia 12/27/2013   Renal mass, right 12/21/2013   Type 2 diabetes mellitus ([name])    Substance abuse ([name])    Hyperlipidemia    Diabetes mellitus type 2, uncontrolled, with cardiovascular complications 06/01/2012   Tobacco abuse 06/01/2012   H/O medication noncompliance 06/01/2012   Cocaine abuse ([name]) 06/01/2012   Essential hypertension, benign    PCP:  [name], [name], FNP Pharmacy:   [facility] [place], [place] - [address]  [address] [place] [address] Phone: [phone] Fax: [phone]     Social Determinants of Health (SDOH) Social History: SDOH Screenings   Food Insecurity: No Food Insecurity (02/15/2023)  Housing: Patient Declined (02/15/2023)  Transportation Needs: No Transportation Needs (02/15/2023)  Utilities: Not At Risk (02/15/2023)  Depression (PHQ2-9): Low Risk  (04/24/2022)  Financial Resource Strain: Low Risk  (05/19/2019)  Physical Activity: Inactive (05/19/2019)  Social Connections: Unknown  (11/28/2021)   Received from [facility]  Stress: No Stress Concern Present (05/19/2019)  Tobacco Use: High Risk (02/15/2023)   SDOH Interventions:     Readmission Risk Interventions    02/16/2023    8:31 AM 01/19/2023    1:19 PM 01/13/2023   10:44 AM  Readmission Risk Prevention Plan  Transportation Screening Complete  Complete  HRI or Home Care Consult   Complete  Social Work Consult for Recovery Care Planning/Counseling   Complete  Palliative Care Screening   Not Applicable  Medication Review [employer]) Complete  Complete  PCP or Specialist appointment within 3-5 days of discharge  Complete   HRI or Home Care Consult Complete Complete   SW Recovery Care/Counseling Consult Complete Complete   Palliative Care Screening Not Applicable Not Applicable   Skilled Nursing Facility Not Applicable Not Applicable

## 2023-02-16 NOTE — Plan of Care (Signed)
?  Problem: Clinical Measurements: ?Goal: Ability to avoid or minimize complications of infection will improve ?Outcome: Progressing ?  ?Problem: Skin Integrity: ?Goal: Skin integrity will improve ?Outcome: Progressing ?  ?

## 2023-02-17 DIAGNOSIS — E440 Moderate protein-calorie malnutrition: Secondary | ICD-10-CM | POA: Insufficient documentation

## 2023-02-17 DIAGNOSIS — A419 Sepsis, unspecified organism: Secondary | ICD-10-CM | POA: Diagnosis not present

## 2023-02-17 DIAGNOSIS — L0390 Cellulitis, unspecified: Secondary | ICD-10-CM | POA: Diagnosis not present

## 2023-02-17 LAB — GLUCOSE, CAPILLARY
Glucose-Capillary: 88 mg/dL (ref 70–99)
Glucose-Capillary: 117 mg/dL — ABNORMAL HIGH (ref 70–99)
Glucose-Capillary: 127 mg/dL — ABNORMAL HIGH (ref 70–99)
Glucose-Capillary: 150 mg/dL — ABNORMAL HIGH (ref 70–99)

## 2023-02-17 MED ORDER — LACTATED RINGERS IV SOLN: INTRAVENOUS | Status: DC

## 2023-02-17 MED ORDER — ADULT MULTIVITAMIN W/MINERALS CH
1.0000 | ORAL_TABLET | Freq: Every day | ORAL | Status: DC
  Administered 2023-02-17 – 2023-03-02 (×14): 1 via ORAL
  Filled 2023-02-17 (×14): qty 1

## 2023-02-17 MED ORDER — GLUCERNA SHAKE PO LIQD
237.0000 mL | Freq: Three times a day (TID) | ORAL | Status: DC
  Administered 2023-02-18 – 2023-02-25 (×15): 237 mL via ORAL

## 2023-02-17 NOTE — Progress Notes (Signed)
[facility] PROGRESS NOTE  [name] (DOB: March 16, 1956) ZOX:[id number] PCP: Genia Hotter, FNP  Brief Narrative: 67 y.o. male with medical history significant of anxiety, history of cocaine abuse, COPD, coronary artery disease, depression, hypertension, GERD, diabetes mellitus type 2 with vascular complications including amputations of toes of left foot, hyperlipidemia, current nicotine use disorder admitted on 02/15/2023 with presumed sepsis due to cellulitis of the right lower extremity   Subjective: Feels generally unwell without specific complaint. Breathing at baseline. Not eating much.   Objective: BP 102/69 (BP Location: Left Arm)   Pulse 78   Temp 98.7 F (37.1 C) (Oral)   Resp 18   Ht 5\' 9"  (1.753 m)   Wt 91.9 kg   SpO2 98%   BMI 29.92 kg/m   Gen: Chronically ill-appearing male in no distress Pulm: Clear, distant, no crackles or wheezes  CV: RRR, no MRG, no pitting edema.  GI: Soft, NT, ND, +BS  Neuro: Alert and oriented. No new focal deficits. Ext: Warm, left foot s/p TMA remotely, healed Skin: Erythema, tender on right foot, no other acute rashes, lesions or ulcers on visualized skin   Assessment & Plan: Principal Problem:   Sepsis due to cellulitis [facility]) Active Problems:   Hypokalemia   Type 2 diabetes mellitus ([name])   Hyponatremia   Essential hypertension   Anxiety state   Mixed hyperlipidemia   Smoking   GERD without esophagitis   Peripheral neuropathy   Cellulitis in diabetic foot ([name])   Aortic mural thrombus ([name])   Malnutrition of moderate degree  Sepsis due to cellulitis--POA: - Continue ceftriaxone.  - Blood cultures NGTD x2 days.   - Lactic acidosis cleared.    PAD--- vascular ultrasound with ABIs showed Abnormal resting right ankle-brachial index of 0.69 consistent with at least moderate underlying peripheral arterial disease. -- Normal resting left ankle-brachial index. -Continue atorvastatin , Ranexa and Plavix--- outpatient  follow-up with vascular surgeon advised -Patient with prior transmetatarsal amputation of the left foot  COPD/emphysema/tobacco abuse -CTA chest noted on admission -No acute exacerbation at this time -Hold off on steroids -Continue bronchodilators -Give nicotine patch   Hypokalemia/hypomagnesemia - Replaced in IVF, up to 4.7, will change IVF to LR and recheck  AKI: ?CIN - Recheck with IVF in AM. If persistent, start work up.    Aortic mural thrombus: As seen on CTA chest - Consulted cardiology who advised to continue patient's Plavix but no need for anticoagulation as it would increase the risk of embolic events to the lower extremities -Continue Ranexa and Plavix   Chronic hypoxic respiratory failure: On 6L O2, says he's been on this since 2015.  - At baseline.   Anxiety:  - Continue alprazolam and prozac   HTN:  - Holding diltiazem  IDT2DM with diabetic peripheral neuropathy: HbA1c 9.3%.  - Continue reduced basal-bolus regimen here.  - Continue lyrica    GERD:  - PPI  Moderate protein calorie malnutrition:  - Supp protein  Tyrone Nine, MD [facility] [email or web address] 02/17/2023, 5:41 PM

## 2023-02-17 NOTE — Progress Notes (Signed)
Initial Nutrition Assessment  DOCUMENTATION CODES:   Non-severe (moderate) malnutrition in context of chronic illness  INTERVENTION:  Liberalize diet to carb modified Glucerna Shake po TID, each supplement provides 220 kcal and 10 grams of protein MVI with minerals daily  NUTRITION DIAGNOSIS:   Moderate Malnutrition related to chronic illness (COPD, T2DM, CAD) as evidenced by percent weight loss, moderate muscle depletion.  GOAL:   Patient will meet greater than or equal to 90% of their needs  MONITOR:   PO intake, Supplement acceptance, Labs, Diet advancement, Weight trends  REASON FOR ASSESSMENT:   Malnutrition Screening Tool    ASSESSMENT:   Pt admitted with chest pain. Pt being worked up for presumed sepsis d/t cellulitis of RLE. PMH significant for anxiety, cocaine use, COPD, CAD, depression, HTN, GERD, T2DM, amputations of L toes, tobacco use.  Pt reports having a poor appetite d/t a decrease in taste over the last couple months. He reports that he has been eating >75% less than his baseline. He is not much of a breakfast eater and does not like eggs. He has been eating items such as Nabs peanut butter crackers and just easy to grab items but not meals. He has been supplementing with Glucerna shakes 3 times daily at home.   Pt wears dentures but denies difficulty chewing/swallowing foods.   Meal completions: 7/30: 80% breakfast, 75% lunch 7/31: 100% breakfast, 75% lunch  Pt reports having a weight loss of about 32 lbs d/t his decrease in appetite/po intake. Reviewed weight history. Since 05/23, pt's weight has declined 8.9% which is clinically significant for time frame.   Medications: SSI 0-15 units TID, SSI 0-5 units at bedtime, levemir 20 units daily, protonix, klor-con, IV abx  Labs: sodium 133, Cr 1.42, phos 2.1, GFR 54, CBG's 88-158 x24 hours  NUTRITION - FOCUSED PHYSICAL EXAM:  Flowsheet Row Most Recent Value  Orbital Region No depletion  Upper Arm  Region Severe depletion  Thoracic and Lumbar Region No depletion  Buccal Region No depletion  Temple Region No depletion  Clavicle Bone Region Moderate depletion  [L shoulder incision, unable to assess]  Clavicle and Acromion Bone Region No depletion  Scapular Bone Region No depletion  Dorsal Hand Moderate depletion  Patellar Region Moderate depletion  Anterior Thigh Region Moderate depletion  Posterior Calf Region Mild depletion  Edema (RD Assessment) Mild  [BLE]  Hair Reviewed  Eyes Reviewed  Mouth Other (Comment)  [edentulous]  Skin Reviewed  Nails Other (Comment)  [clubbing]       Diet Order:   Diet Order             Diet Carb Modified Fluid consistency: Thin; Room service appropriate? Yes  Diet effective now                   EDUCATION NEEDS:   Education needs have been addressed  Skin:  Skin Assessment: Reviewed RN Assessment  Last BM:  7/31  Height:   Ht Readings from Last 1 Encounters:  02/15/23 5\' 9"  (1.753 m)    Weight:   Wt Readings from Last 1 Encounters:  02/15/23 91.9 kg   BMI:  Body mass index is 29.92 kg/m.  Estimated Nutritional Needs:   Kcal:  2000-2200  Protein:  100-115g  Fluid:  >/=2L  Drusilla Kanner, RDN, LDN Clinical Nutrition

## 2023-02-17 NOTE — Plan of Care (Signed)
  Problem: Clinical Measurements: Goal: Ability to avoid or minimize complications of infection will improve Outcome: Progressing   Problem: Skin Integrity: Goal: Skin integrity will improve Outcome: Not Met (add Reason)   Problem: Clinical Measurements: Goal: Ability to maintain clinical measurements within normal limits will improve Outcome: Not Met (add Reason)

## 2023-02-18 DIAGNOSIS — L0390 Cellulitis, unspecified: Secondary | ICD-10-CM | POA: Diagnosis not present

## 2023-02-18 DIAGNOSIS — A419 Sepsis, unspecified organism: Secondary | ICD-10-CM | POA: Diagnosis not present

## 2023-02-18 LAB — RENAL FUNCTION PANEL
Albumin: 2.2 g/dL — ABNORMAL LOW (ref 3.5–5.0)
Anion gap: 5 (ref 5–15)
BUN: 15 mg/dL (ref 8–23)
CO2: 19 mmol/L — ABNORMAL LOW (ref 22–32)
Calcium: 8.2 mg/dL — ABNORMAL LOW (ref 8.9–10.3)
Chloride: 106 mmol/L (ref 98–111)
Creatinine, Ser: 1.43 mg/dL — ABNORMAL HIGH (ref 0.61–1.24)
GFR, Estimated: 54 mL/min — ABNORMAL LOW (ref >60)
Glucose, Bld: 124 mg/dL — ABNORMAL HIGH (ref 70–99)
Phosphorus: 2.4 mg/dL — ABNORMAL LOW (ref 2.5–4.6)
Potassium: 5.3 mmol/L — ABNORMAL HIGH (ref 3.5–5.1)
Sodium: 130 mmol/L — ABNORMAL LOW (ref 135–145)

## 2023-02-18 LAB — CBC
HCT: 28.2 % — ABNORMAL LOW (ref 39.0–52.0)
Hemoglobin: 9 g/dL — ABNORMAL LOW (ref 13.0–17.0)
MCH: 26.8 pg (ref 26.0–34.0)
MCHC: 31.9 g/dL (ref 30.0–36.0)
MCV: 83.9 fL (ref 80.0–100.0)
Platelets: 177 10*3/uL (ref 150–400)
RBC: 3.36 MIL/uL — ABNORMAL LOW (ref 4.22–5.81)
RDW: 14.6 % (ref 11.5–15.5)
WBC: 7.8 10*3/uL (ref 4.0–10.5)
nRBC: 0 % (ref 0.0–0.2)

## 2023-02-18 LAB — GLUCOSE, CAPILLARY
Glucose-Capillary: 127 mg/dL — ABNORMAL HIGH (ref 70–99)
Glucose-Capillary: 128 mg/dL — ABNORMAL HIGH (ref 70–99)
Glucose-Capillary: 115 mg/dL — ABNORMAL HIGH (ref 70–99)
Glucose-Capillary: 106 mg/dL — ABNORMAL HIGH (ref 70–99)
Glucose-Capillary: 143 mg/dL — ABNORMAL HIGH (ref 70–99)

## 2023-02-18 NOTE — Progress Notes (Signed)
Transition of Care (TOC) -30 day Note        Transition of Care [facility]) CM/SW Contact  Name: Elliot Gault Phone Number: [phone]   MUST ID: [id number]   To Whom it May Concern:   Please be advised that the above patient will require a short-term nursing home stay, anticipated 30 days or less rehabilitation and strengthening. The plan is for return home.

## 2023-02-18 NOTE — NC FL2 (Signed)
[place] MEDICAID FL2 LEVEL OF CARE FORM     IDENTIFICATION  Patient Name: [name] Birthdate: 02/26/1956 Sex: male Admission Date (Current Location): 02/15/2023  [facility] [place] and [place] Number:  [employer] and Address:  [facility],  [address] [place] [address]      Provider Number: [id number]  Attending Physician Name and Address:  Tyrone Nine, MD  Relative Name and Phone Number:       Current Level of Care: Hospital Recommended Level of Care: Skilled Nursing Facility Prior Approval Number:    Date Approved/Denied:   PASRR Number:    Discharge Plan: SNF    Current Diagnoses: Patient Active Problem List   Diagnosis Date Noted   Malnutrition of moderate degree 02/17/2023   Sepsis due to cellulitis ([name]) 02/15/2023   Cellulitis in diabetic foot ([name]) 02/15/2023   Aortic mural thrombus ([name]) 02/15/2023   Acute metabolic encephalopathy 02/02/2023   Hypokalemia 02/02/2023   Humerus fracture 01/12/2023   Nausea 12/23/2022   Generalized weakness 12/22/2022   Oropharyngeal candidiasis 02/11/2022   Palliative care encounter 01/06/2022   Abnormal CT scan, bladder 01/06/2022   Single kidney 01/06/2022   Diarrhea 10/23/2021   Osteomyelitis ([name]) 07/15/2021   History of complete ray amputation of first toe of left foot ([name]) 04/24/2021   Osteomyelitis of second toe of left foot ([name]) 04/24/2021   Diabetic foot ulcer ([name]) 12/24/2020   Benign prostatic hyperplasia 11/08/2020   Body mass index (BMI) 38.0-38.9, adult 11/08/2020   Chronic diastolic heart failure ([name]) 11/08/2020   Chronic pain 11/08/2020   Diabetic peripheral neuropathy associated with type 2 diabetes mellitus ([name]) 11/08/2020   Diastolic dysfunction 11/08/2020   GERD without esophagitis 11/08/2020   Hearing loss 11/08/2020   Incontinence of feces 11/08/2020   Long term (current) use of insulin ([name]) 11/08/2020   Loss of appetite 11/08/2020   Nicotine dependence,  unspecified, uncomplicated 11/08/2020   Peripheral neuropathy 11/08/2020   Personal history of other malignant neoplasm of kidney 11/08/2020   Severe hyperglycemia due to diabetes mellitus ([name]) 11/08/2020   Urinary tract obstruction 11/08/2020   Asymptomatic varicose veins of bilateral lower extremities 11/08/2020   Chronic respiratory failure with hypoxia ([name]) 12/23/2018   UTI symptoms 11/08/2018   Abdominal distention 11/08/2018   Chest pain 11/08/2018   Hyperglycemia due to diabetes mellitus ([name]) 11/08/2018   Polypharmacy 06/03/2018   Diabetes mellitus due to underlying condition, uncontrolled, with hyperglycemia, with long-term current use of insulin ([name]) 06/03/2018   Decreased mobility and endurance 07/19/2015   CKD (chronic kidney disease) stage 3, GFR 30-59 ml/min ([name]) 07/06/2015   Interstitial lung disease ([name]) 04/10/2015   Cough syncope 04/10/2015   Dyspnea 04/10/2015   Atypical chest pain 02/28/2015   Syncope 02/28/2015   Orthostatic dizziness 02/25/2015   Right leg pain 02/25/2015   Right calf pain 02/14/2015   Malaise and fatigue 02/14/2015   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[name]) 11/12/2014   Smoking 11/12/2014   History of stroke 10/24/2014   Carotid disease, bilateral ([name]) 10/24/2014   Orthostatic hypotension 10/24/2014   Obstructive sleep apnea    Mixed hyperlipidemia    Acute kidney injury superimposed on CKD ([name])    Renal cell carcinoma ([name])    Depression    Anxiety state    Noncompliance with medication regimen    Carotid artery stenosis 10/08/2014   Dizziness 09/26/2014   Left sided numbness 09/26/2014   Hyperreflexia 09/26/2014   Essential hypertension 09/26/2014   COPD (  chronic obstructive pulmonary disease) ([name]) 08/16/2014   ILD (interstitial lung disease) ([name]) 08/16/2014   Dizziness and giddiness 08/16/2014   Other specified hypotension 08/16/2014   Snoring 04/30/2014   Fluid overload 02/22/2014   Renal neoplasm 02/21/2014    Preoperative cardiovascular examination 02/07/2014   PNA (pneumonia) 12/27/2013   Hyponatremia 12/27/2013   Renal mass, right 12/21/2013   Type 2 diabetes mellitus ([name])    Substance abuse ([name])    Hyperlipidemia    Diabetes mellitus type 2, uncontrolled, with cardiovascular complications 06/01/2012   Tobacco abuse 06/01/2012   H/O medication noncompliance 06/01/2012   Cocaine abuse ([name]) 06/01/2012   Essential hypertension, benign     Orientation RESPIRATION BLADDER Height & Weight     Self, Situation, Time, Place  O2 (5L) Continent Weight: 202 lb 9.6 oz (91.9 kg) Height:  5\' 9"  (175.3 cm)  BEHAVIORAL SYMPTOMS/MOOD NEUROLOGICAL BOWEL NUTRITION STATUS      Continent Diet (heart healthy)  AMBULATORY STATUS COMMUNICATION OF NEEDS Skin   Extensive Assist Verbally Normal                       Personal Care Assistance Level of Assistance  Bathing, Feeding, Dressing Bathing Assistance: Limited assistance Feeding assistance: Independent Dressing Assistance: Limited assistance     Functional Limitations Info  Sight, Hearing, Speech Sight Info: Impaired Hearing Info: Adequate Speech Info: Adequate    SPECIAL CARE FACTORS FREQUENCY  PT (By licensed PT), OT (By licensed OT)     PT Frequency: 5 times weekly OT Frequency: 5 times weekly            Contractures Contractures Info: Not present    Additional Factors Info  Code Status, Allergies Code Status Info: DNR Allergies Info: Lisinopril, Pollen Extract, Oxycodone           Current Medications (02/18/2023):  This is the current hospital active medication list Current Facility-Administered Medications  Medication Dose Route Frequency Provider Last Rate Last Admin   acetaminophen (TYLENOL) tablet 650 mg  650 mg Oral Q6H PRN Zierle-Ghosh, Asia B, DO   650 mg at 02/17/23 2047   Or   acetaminophen (TYLENOL) suppository 650 mg  650 mg Rectal Q6H PRN Zierle-Ghosh, Asia B, DO       albuterol (PROVENTIL) (2.5 MG/3ML)  0.083% nebulizer solution 2.5 mg  2.5 mg Nebulization Q2H PRN Emokpae, Courage, MD       ALPRAZolam Prudy Feeler) tablet 1 mg  1 mg Oral TID Zierle-Ghosh, Asia B, DO   1 mg at 02/18/23 0915   atorvastatin (LIPITOR) tablet 20 mg  20 mg Oral QHS Zierle-Ghosh, Asia B, DO   20 mg at 02/17/23 2047   cefTRIAXone (ROCEPHIN) 1 g in sodium chloride 0.9 % 100 mL IVPB  1 g Intravenous Q24H Zierle-Ghosh, Asia B, DO   Stopped at 02/17/23 1216   clopidogrel (PLAVIX) tablet 75 mg  75 mg Oral Daily Zierle-Ghosh, Asia B, DO   75 mg at 02/18/23 0915   dextromethorphan-guaiFENesin (MUCINEX DM) 30-600 MG per 12 hr tablet 1 tablet  1 tablet Oral BID Emokpae, Courage, MD   1 tablet at 02/18/23 0915   feeding supplement (GLUCERNA SHAKE) (GLUCERNA SHAKE) liquid 237 mL  237 mL Oral TID BM Tyrone Nine, MD   237 mL at 02/18/23 0916   FLUoxetine (PROZAC) capsule 40 mg  40 mg Oral Daily Zierle-Ghosh, Asia B, DO   40 mg at 02/18/23 0915   heparin injection 5,000 Units  5,000 Units  Subcutaneous Q8H Zierle-Ghosh, Asia B, DO   5,000 Units at 02/18/23 0505   insulin aspart (novoLOG) injection 0-15 Units  0-15 Units Subcutaneous TID WC Zierle-Ghosh, Asia B, DO   2 Units at 02/18/23 0916   insulin aspart (novoLOG) injection 0-5 Units  0-5 Units Subcutaneous QHS Zierle-Ghosh, Asia B, DO       insulin detemir (LEVEMIR) injection 20 Units  20 Units Subcutaneous QHS Zierle-Ghosh, Asia B, DO   20 Units at 02/17/23 2106   ipratropium-albuterol (DUONEB) 0.5-2.5 (3) MG/3ML nebulizer solution 3 mL  3 mL Nebulization TID Shon Hale, MD   3 mL at 02/18/23 0740   isosorbide mononitrate (IMDUR) 24 hr tablet 60 mg  60 mg Oral Daily Zierle-Ghosh, Asia B, DO   60 mg at 02/18/23 0915   And   isosorbide mononitrate (IMDUR) 24 hr tablet 30 mg  30 mg Oral QHS Zierle-Ghosh, Asia B, DO   30 mg at 02/17/23 2047   lactated ringers infusion   Intravenous Continuous Tyrone Nine, MD 75 mL/hr at 02/18/23 0308 Infusion Verify at 02/18/23 0308   loperamide  (IMODIUM) capsule 2 mg  2 mg Oral PRN Zierle-Ghosh, Asia B, DO   2 mg at 02/16/23 1902   midodrine (PROAMATINE) tablet 2.5 mg  2.5 mg Oral BID Zierle-Ghosh, Asia B, DO   2.5 mg at 02/18/23 0915   morphine (PF) 2 MG/ML injection 2 mg  2 mg Intravenous Q2H PRN Zierle-Ghosh, Asia B, DO   2 mg at 02/17/23 1610   multivitamin with minerals tablet 1 tablet  1 tablet Oral Daily Tyrone Nine, MD   1 tablet at 02/18/23 0915   nicotine (NICODERM CQ - dosed in mg/24 hours) patch 21 mg  21 mg Transdermal Daily Emokpae, Courage, MD   21 mg at 02/18/23 0915   ondansetron (ZOFRAN) tablet 4 mg  4 mg Oral Q6H PRN Zierle-Ghosh, Asia B, DO       Or   ondansetron (ZOFRAN) injection 4 mg  4 mg Intravenous Q6H PRN Zierle-Ghosh, Asia B, DO       oxyCODONE (Oxy IR/ROXICODONE) immediate release tablet 5 mg  5 mg Oral Q4H PRN Zierle-Ghosh, Asia B, DO   5 mg at 02/17/23 1712   pantoprazole (PROTONIX) EC tablet 40 mg  40 mg Oral Daily Zierle-Ghosh, Asia B, DO   40 mg at 02/18/23 0915   ranolazine (RANEXA) 12 hr tablet 500 mg  500 mg Oral BID Zierle-Ghosh, Asia B, DO   500 mg at 02/18/23 0915   tamsulosin (FLOMAX) capsule 0.4 mg  0.4 mg Oral QPC supper Emokpae, Courage, MD   0.4 mg at 02/17/23 1712   zolpidem (AMBIEN) tablet 5 mg  5 mg Oral QHS PRN Zierle-Ghosh, Asia B, DO   5 mg at 02/17/23 2047     Discharge Medications: Please see discharge summary for a list of discharge medications.  Relevant Imaging Results:  Relevant Lab Results:   Additional Information SSN: [id number], [place]

## 2023-02-18 NOTE — Plan of Care (Signed)
  Problem: Clinical Measurements: Goal: Ability to avoid or minimize complications of infection will improve Outcome: Progressing   Problem: Skin Integrity: Goal: Skin integrity will improve Outcome: Progressing   Problem: Education: Goal: Knowledge of General Education information will improve Description: Including pain rating scale, medication(s)/side effects and non-pharmacologic comfort measures Outcome: Progressing   Problem: Activity: Goal: Risk for activity intolerance will decrease Outcome: Progressing   Problem: Nutrition: Goal: Adequate nutrition will be maintained Outcome: Progressing

## 2023-02-18 NOTE — Plan of Care (Signed)
  Problem: Acute Rehab PT Goals(only PT should resolve) Goal: Pt Will Go Supine/Side To Sit Outcome: Progressing Flowsheets (Taken 02/18/2023 1128) Pt will go Supine/Side to Sit: with minimal assist Goal: Patient Will Transfer Sit To/From Stand Outcome: Progressing Flowsheets (Taken 02/18/2023 1128) Patient will transfer sit to/from stand: with minimal assist Goal: Pt Will Transfer Bed To Chair/Chair To Bed Outcome: Progressing Flowsheets (Taken 02/18/2023 1128) Pt will Transfer Bed to Chair/Chair to Bed:  with min assist  min guard assist Goal: Pt Will Ambulate Outcome: Progressing Flowsheets (Taken 02/18/2023 1128) Pt will Ambulate:  15 feet  with minimal assist   Adrieanna Boteler SPT [employer], DPT Program

## 2023-02-18 NOTE — Plan of Care (Signed)
  Problem: Acute Rehab OT Goals (only OT should resolve) Goal: Pt. Will Perform Grooming Flowsheets (Taken 02/18/2023 0950) Pt Will Perform Grooming:  sitting  with modified independence Goal: Pt. Will Perform Upper Body Bathing Flowsheets (Taken 02/18/2023 0950) Pt Will Perform Upper Body Bathing:  with modified independence  sitting Goal: Pt. Will Perform Upper Body Dressing Flowsheets (Taken 02/18/2023 0950) Pt Will Perform Upper Body Dressing:  with modified independence  sitting Goal: Pt. Will Perform Lower Body Dressing Flowsheets (Taken 02/18/2023 0950) Pt Will Perform Lower Body Dressing:  with min assist  with adaptive equipment  sitting/lateral leans Goal: Pt. Will Transfer To Toilet Flowsheets (Taken 02/18/2023 0950) Pt Will Transfer to Toilet:  with supervision  stand pivot transfer Goal: Pt. Will Perform Toileting-Clothing Manipulation Flowsheets (Taken 02/18/2023 0950) Pt Will Perform Toileting - Clothing Manipulation and hygiene:  with modified independence  sitting/lateral leans Goal: Pt/Caregiver Will Perform Home Exercise Program Flowsheets (Taken 02/18/2023 [phone]) Pt/caregiver will Perform Home Exercise Program:  Increased strength  Increased ROM  Right Upper extremity  Left upper extremity  With minimal assist  Kinan Safley OT, MOT

## 2023-02-18 NOTE — Progress Notes (Signed)
[facility] PROGRESS NOTE  [name] (DOB: 1956/06/08) [id number] PCP: Genia Hotter, FNP  Brief Narrative: 67 y.o. male with medical history significant of anxiety, history of cocaine abuse, COPD, coronary artery disease, depression, hypertension, GERD, diabetes mellitus type 2 with vascular complications including amputations of toes of left foot, hyperlipidemia, current nicotine use disorder admitted on 02/15/2023 with presumed sepsis due to cellulitis of the right lower extremity   Subjective: Feels unwell, has pain in legs and shoulders and back which is all chronic. He specifies that he has no new complaints.   Objective: BP 92/60   Pulse 83   Temp 100 F (37.8 C)   Resp 20   Ht 5\' 9"  (1.753 m)   Wt 91.9 kg   SpO2 95%   BMI 29.92 kg/m   Gen: Chronically ill-appearing Pulm: Clear, nonlabored  CV: RRR GI: Soft, NT, ND, +BS  Neuro: Alert and oriented. No new focal deficits. Ext: Warm, dry, L TMA healed Skin: Right foot with tender erythema, no induration or fluctuance, improving, diminished DP pulse.  Assessment & Plan: Sepsis due to cellulitis:  - Continue ceftriaxone. Recurrently febrile 8/1 though WBC normalized. Mural thrombus could be source of fevers at times, will monitor closely.  - Blood cultures NGTD x3 days.   - Lactic acidosis cleared.    PAD: Right ABI 0.69 (moderate), Left ABI normal.   - Continue atorvastatin, ranexa and plavix  - Patient with prior transmetatarsal amputation of the left foot - Increases risk of treatment failure.   COPD/emphysema/tobacco abuse: No wheezing.  - Continue bronchodilators - Continue nicotine patch   Hypokalemia/hypomagnesemia - Replaced in IVF, up to 5.3, DC supp.  AKI: ?CIN - Recheck with IVF in AM. If persistent, start work up.    Aortic mural thrombus: As seen on CTA chest - Consulted cardiology who advised to continue patient's Plavix but no need for anticoagulation as it would increase the risk  of embolic events to the lower extremities -Continue Ranexa and Plavix   Chronic hypoxic respiratory failure: On 6L O2, says he's been on this since 2015.  - At baseline.   Anxiety:  - Continue alprazolam and prozac   HTN:  - Holding diltiazem  IDT2DM with diabetic peripheral neuropathy: HbA1c 9.3%.  - Continue reduced basal-bolus regimen here.  - Continue lyrica    GERD:  - PPI  Moderate protein calorie malnutrition:  - Supp protein  Debility: Pt has marked weakness diffusely - PT/OT consulted > SNF rehabilitation being pursued.   Tyrone Nine, MD [facility] [email or web address] 02/18/2023, 5:17 PM

## 2023-02-18 NOTE — Progress Notes (Signed)
   02/18/23 1457  Assess: MEWS Score  Temp (!) 101 F (38.3 C)  BP (!) 85/63  MAP (mmHg) 71  Pulse Rate 84  Level of Consciousness Alert  SpO2 94 %  O2 Device Nasal Cannula  O2 Flow Rate (L/min) 3 L/min  Assess: if the MEWS score is Yellow or Red  Were vital signs accurate and taken at a resting state? Yes  Does the patient meet 2 or more of the SIRS criteria? No  MEWS guidelines implemented  Yes, yellow  Treat  MEWS Interventions Considered administering scheduled or prn medications/treatments as ordered  Take Vital Signs  Increase Vital Sign Frequency  Yellow: Q2hr x1, continue Q4hrs until patient remains green for 12hrs  Escalate  MEWS: Escalate Yellow: Discuss with charge nurse and consider notifying provider and/or RRT  Notify: Charge Nurse/RN  Name of Charge Nurse/RN Notified Yehuda Savannah, RN  Provider Notification  Provider Name/Title Dr. Jarvis Newcomer  Date Provider Notified 02/18/23  Time Provider Notified 1510  Method of Notification Page  Notification Reason Change in status  Provider response No new orders  Date of Provider Response 02/18/23  Time of Provider Response 1531  Assess: SIRS CRITERIA  SIRS Temperature  1  SIRS Pulse 0  SIRS Respirations  0  SIRS WBC 0  SIRS Score Sum  1

## 2023-02-18 NOTE — Evaluation (Signed)
Physical Therapy Evaluation Patient Details Name: [name] MRN: [id number] DOB: 1955/09/09 Today's Date: 02/18/2023  History of Present Illness  [name] is a 67 y.o. 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  Patient reports he had diarrhea at least 5 times last 5 hours.  He actually has an incontinent episode during my exam.  He denies any hematochezia or melena.  Patient reports he has not felt feverish although he did have a fever when he came into the ER.  He has had chest pain.  He describes his pain in the center of his chest and radiating left.  It feels like a heaviness.  It feels similar to when he had a MI in the past.  His troponin in the ER is actually downtrending from 20-17.  Patient reports he has not felt any increase in shortness of breath.  He is on 6 L nasal cannula at baseline.  He has not had any dizziness, malaise, fatigue.  He denies any cough.  Patient does report he has had urinary retention.  He has not had any urine output for the entire day.  He reports prior to that he was not having any dysuria.  Patient reports that yesterday his neighbor noticed that his right lower extremity was swollen.  Patient reports that it had felt like needles and cold.  He attributed it to his neuropathy.  It got worse throughout the night.  The bottom of the foot started hurting badly.  Patient reports that this was not normal for him.  He did not consider that it may be infected.  Patient reports that he takes Lyrica at home and has not had a "neuropathy flare" in quite some time.  His Lyrica was not helping last night.  In regards to patient's diarrhea, patient reports not  having been on any antibiotics recently.  He has had no abdominal pain.  Nobody else around him is having diarrhea.  Patient has no other complaints at this time.  Clinical Impression  Pt tolerated treatment. Participation in activity was limited due to LE weakness, fatigue, and report of dizziness upon standing. Required mod assist with RW to stand, and min assist with RW to side step to chair. Upon sitting in chair BP was measured as 99/54. Nurse informed. Pt put on 3 L of O2 and measured at 92-94% throughout session.  Pt tolerated sitting up in chair at end of session. Patient will benefit from continued skilled physical therapy in hospital and recommended venue below to increase strength, balance, endurance for safe ADLs and gait.       If plan is discharge home, recommend the following: A lot of help with walking and/or transfers;A lot of help with bathing/dressing/bathroom;Assistance with cooking/housework;Assist for transportation   Can travel by private vehicle        Equipment Recommendations    Recommendations for Other Services       Functional Status Assessment Patient has had a recent decline in their functional status and demonstrates the ability to make significant improvements in function in a reasonable and predictable amount of time.     Precautions / Restrictions Precautions Precautions: Fall Restrictions Weight Bearing Restrictions: No LUE Weight Bearing: Weight bearing as tolerated  Mobility  Bed Mobility Overal bed mobility: Needs Assistance Bed Mobility: Supine to Sit     Supine to sit: Min guard, Min assist, HOB elevated     General bed mobility comments: Assist to scoot to EOB    Transfers Overall transfer level: Needs assistance Equipment used: Rolling walker (2 wheels) Transfers: Sit to/from Stand, Bed to chair/wheelchair/BSC Sit to Stand: Mod assist   Step pivot transfers: Min assist       General transfer comment: Assist to boost  from EOB; more min A for step to chair.    Ambulation/Gait Ambulation/Gait assistance: Min assist Gait Distance (Feet): 4 Feet Assistive device: Rolling walker (2 wheels) Gait Pattern/deviations: Step-to pattern, Decreased step length - left, Decreased step length - right, Decreased stride length Gait velocity: slow     General Gait Details: Labored and limited due to pt's dizziness  Stairs            Wheelchair Mobility     Tilt Bed    Modified Rankin (Stroke Patients Only)       Balance Overall balance assessment: Needs assistance Sitting-balance support: Feet supported, Bilateral upper extremity supported Sitting balance-Leahy Scale: Good Sitting balance - Comments: seated at EOB   Standing balance support: Bilateral upper extremity supported, During functional activity, Reliant on assistive device for balance Standing balance-Leahy Scale: Poor Standing balance comment: poor to fair ; posterior sway                             Pertinent Vitals/Pain Pain Assessment Pain Assessment: No/denies pain    Home Living Family/patient expects to be discharged to:: Private residence Living Arrangements: Parent Available Help at Discharge: Personal care attendant;Family;Available PRN/intermittently Type of Home: Mobile home Home Access: Ramped entrance       Home Layout: One level Home Equipment: Agricultural consultant (2 wheels);Rollator (4 wheels);Shower seat;Wheelchair - manual;Electric scooter;Hospital bed;Grab bars - tub/shower;Hand held shower head Additional Comments: Pt reports family is not of help to pt.    Prior Function Prior Level of Function : Needs assist       Physical Assist : ADLs (physical)   ADLs (physical): IADLs;Dressing Mobility Comments: Reports using RW outside home and cane inside the home. ADLs Comments: Home aides 2 hours/day x 5 days/week; assisted for bathing, dressing, and IADL's, pt is the caregiver of his mom; this is per  chart review. Today pt reoprts home aides are likely quiting and that he is only assisted for groceries.     Hand Dominance   Dominant Hand: Right    Extremity/Trunk Assessment   Upper Extremity Assessment Upper Extremity Assessment: LUE deficits/detail;RUE deficits/detail RUE Deficits / Details: Generally weak. LUE Deficits / Details: Pt reports having shoulder operation in June that he has had no therapy for. Reports therapy was supossed to come out to his house but they did not. Currently very minimal functional movement at shoulder. 2+/5 elbow    Lower Extremity Assessment Lower Extremity Assessment: Defer to PT evaluation    Cervical / Trunk Assessment Cervical / Trunk Assessment: Normal  Communication   Communication: No difficulties  Cognition Arousal/Alertness: Awake/alert Behavior During Therapy: WFL for tasks assessed/performed Overall Cognitive Status: Within Functional Limits for tasks assessed  General Comments      Exercises     Assessment/Plan    PT Assessment Patient needs continued PT services  PT Problem List Decreased strength;Decreased range of motion;Decreased activity tolerance;Decreased balance;Decreased mobility       PT Treatment Interventions DME instruction;Gait training;Balance training;Stair training;Functional mobility training;Therapeutic activities;Therapeutic exercise    PT Goals (Current goals can be found in the Care Plan section)  Acute Rehab PT Goals Patient Stated Goal: Return home with family to assist PT Goal Formulation: With patient Time For Goal Achievement: 03/04/23 Potential to Achieve Goals: Fair    Frequency Min 3X/week     Co-evaluation PT/OT/SLP Co-Evaluation/Treatment: Yes Reason for Co-Treatment: To address functional/ADL transfers PT goals addressed during session: Mobility/safety with mobility;Balance;Proper use of DME;Strengthening/ROM OT goals  addressed during session: ADL's and self-care       AM-PAC PT "6 Clicks" Mobility  Outcome Measure Help needed turning from your back to your side while in a flat bed without using bedrails?: A Little Help needed moving from lying on your back to sitting on the side of a flat bed without using bedrails?: A Little Help needed moving to and from a bed to a chair (including a wheelchair)?: A Lot Help needed standing up from a chair using your arms (e.g., wheelchair or bedside chair)?: A Lot Help needed to walk in hospital room?: A Lot Help needed climbing 3-5 steps with a railing? : Total 6 Click Score: 13    End of Session Equipment Utilized During Treatment: Gait belt Activity Tolerance: Patient limited by fatigue Patient left: in chair;with call bell/phone within reach Nurse Communication: Mobility status PT Visit Diagnosis: Unsteadiness on feet (R26.81);Other abnormalities of gait and mobility (R26.89);Muscle weakness (generalized) (M62.81)    Time: 3235-5732 PT Time Calculation (min) (ACUTE ONLY): 25 min   Charges:   PT Evaluation $PT Eval Moderate Complexity: 1 Mod PT Treatments $Therapeutic Activity: 23-37 mins PT General Charges $$ ACUTE PT VISIT: 1 Visit         Morna Flud SPT [place], DPT Program

## 2023-02-18 NOTE — Evaluation (Signed)
Occupational Therapy Evaluation Patient Details Name: [name] MRN: [id number] DOB: Dec 20, 1955 Today's Date: 02/18/2023   History of Present Illness [name] is a 67 y.o. 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  Patient reports he had diarrhea at least 5 times last 5 hours.  He actually has an incontinent episode during my exam.  He denies any hematochezia or melena.  Patient reports he has not felt feverish although he did have a fever when he came into the ER.  He has had chest pain.  He describes his pain in the center of his chest and radiating left.  It feels like a heaviness.  It feels similar to when he had a MI in the past.  His troponin in the ER is actually downtrending from 20-17.  Patient reports he has not felt any increase in shortness of breath.  He is on 6 L nasal cannula at baseline.  He has not had any dizziness, malaise, fatigue.  He denies any cough.  Patient does report he has had urinary retention.  He has not had any urine output for the entire day.  He reports prior to that he was not having any dysuria.  Patient reports that yesterday his neighbor noticed that his right lower extremity was swollen.  Patient reports that it had felt like needles and cold.  He attributed it to his neuropathy.  It got worse throughout the night.  The bottom of the foot started hurting badly.  Patient reports that this was not normal for him.  He did not consider that it may be infected.  Patient reports that he takes Lyrica at home and has not had a "neuropathy flare" in quite some time.  His Lyrica was not helping last night.  In regards to patient's diarrhea, patient reports not  having been on any antibiotics recently.  He has had no abdominal pain.  Nobody else around him is having diarrhea.  Patient has no other complaints at this time. (per DO)   Clinical Impression   Pt placed on 3 L supplemental O2 during PT and OT co-evaluation. Pt saturation around 92% SpO2 while on 3 L. Blood pressure recorded at 99/54 after pt stood beside the chair. Nursing was notified of this. Pt reports limited support at home with care attendant likely quitting. Pt is requiring assist for lower body and upper body ADL's. Limited L UE A/ROM and strength due to recent shoulder operation. Pt was left in the chair with nurse present. Pt will benefit from continued OT in the hospital and recommended venue below to increase strength, balance, and endurance for safe ADL's.         Recommendations for follow up therapy are one component of a multi-disciplinary discharge planning process, led by the attending physician.  Recommendations may be updated based on patient status, additional functional criteria and insurance authorization.   Assistance Recommended at Discharge Intermittent Supervision/Assistance  Patient can return home with the following A lot of help with walking and/or transfers;A lot of help with bathing/dressing/bathroom;Assistance with cooking/housework;Assist for transportation;Help with stairs or ramp for entrance    Functional Status Assessment  Patient has had a recent decline in their functional status and demonstrates the ability to make  significant improvements in function in a reasonable and predictable amount of time.  Equipment Recommendations  None recommended by OT    Recommendations for Other Services       Precautions / Restrictions Precautions Precautions: Fall Restrictions Weight Bearing Restrictions: No      Mobility Bed Mobility Overal bed mobility: Needs Assistance Bed Mobility: Supine to Sit     Supine to sit: Min guard, Min assist, HOB  elevated     General bed mobility comments: Assist to scoot to EOB    Transfers Overall transfer level: Needs assistance Equipment used: Rolling walker (2 wheels) Transfers: Sit to/from Stand, Bed to chair/wheelchair/BSC Sit to Stand: Mod assist     Step pivot transfers: Min assist     General transfer comment: Assist to boost from EOB; more min A for step to chair.      Balance Overall balance assessment: Needs assistance Sitting-balance support: Feet supported, Bilateral upper extremity supported Sitting balance-Leahy Scale: Good Sitting balance - Comments: seated at EOB   Standing balance support: Bilateral upper extremity supported, During functional activity, Reliant on assistive device for balance Standing balance-Leahy Scale: Poor Standing balance comment: poor to fair ; posterior sway                           ADL either performed or assessed with clinical judgement   ADL Overall ADL's : Needs assistance/impaired     Grooming: Minimal assistance;Sitting   Upper Body Bathing: Minimal assistance;Sitting   Lower Body Bathing: Maximal assistance;Sitting/lateral leans   Upper Body Dressing : Minimal assistance;Sitting   Lower Body Dressing: Maximal assistance;Sitting/lateral leans   Toilet Transfer: Moderate assistance;Rolling walker (2 wheels);Stand-pivot Statistician Details (indicate cue type and reason): Simulated via EOB to chair transfer Toileting- Clothing Manipulation and Hygiene: Minimal assistance;Sitting/lateral lean;Min guard       Functional mobility during ADLs: Moderate assistance;Rolling walker (2 wheels)       Vision Baseline Vision/History: 1 Wears glasses Ability to See in Adequate Light: 0 Adequate Patient Visual Report: No change from baseline Vision Assessment?: No apparent visual deficits                Pertinent Vitals/Pain Pain Assessment Pain Assessment: No/denies pain     Hand Dominance Right    Extremity/Trunk Assessment Upper Extremity Assessment Upper Extremity Assessment: LUE deficits/detail;RUE deficits/detail RUE Deficits / Details: Generally weak. LUE Deficits / Details: Pt reports having shoulder operation in June that he has had no therapy for. Reports therapy was supossed to come out to his house but they did not. Currently very minimal functional movement at shoulder. 2+/5 elbow   Lower Extremity Assessment Lower Extremity Assessment: Defer to PT evaluation   Cervical / Trunk Assessment Cervical / Trunk Assessment: Normal   Communication Communication Communication: No difficulties   Cognition Arousal/Alertness: Awake/alert Behavior During Therapy: WFL for tasks assessed/performed Overall Cognitive Status: Within Functional Limits for tasks assessed                                                        Home Living Family/patient expects to be discharged to:: Private residence Living Arrangements: Parent Available Help at Discharge: Personal care attendant;Family;Available PRN/intermittently (Pt believes his care attendant will not be available anymore. Attendant was coming 2 hours a day  for 5 days a week per chart review.) Type of Home: Mobile home Home Access: Ramped entrance     Home Layout: One level     Bathroom Shower/Tub: Producer, television/film/video: Handicapped height Bathroom Accessibility: Yes   Home Equipment: Agricultural consultant (2 wheels);Rollator (4 wheels);Shower seat;Wheelchair - manual;Electric scooter;Hospital bed;Grab bars - tub/shower;Hand held shower head   Additional Comments: Pt reports family is not of help to pt.      Prior Functioning/Environment Prior Level of Function : Needs assist       Physical Assist : ADLs (physical)   ADLs (physical): IADLs;Dressing Mobility Comments: Reports using RW outside home and cane inside the home. ADLs Comments: Home aides 2 hours/day x 5 days/week; assisted for  bathing, dressing, and IADL's, pt is the caregiver of his mom; this is per chart review. Today pt reoprts home aides are likely quiting and that he is only assisted for groceries.        OT Problem List: Decreased strength;Decreased range of motion;Decreased activity tolerance;Impaired balance (sitting and/or standing);Impaired UE functional use;Cardiopulmonary status limiting activity      OT Treatment/Interventions: Self-care/ADL training;Therapeutic exercise;Therapeutic activities;Patient/family education;Balance training    OT Goals(Current goals can be found in the care plan section) Acute Rehab OT Goals Patient Stated Goal: Open to rehab to improve function. OT Goal Formulation: With patient Time For Goal Achievement: 03/04/23 Potential to Achieve Goals: Good  OT Frequency: Min 2X/week    Co-evaluation PT/OT/SLP Co-Evaluation/Treatment: Yes Reason for Co-Treatment: To address functional/ADL transfers   OT goals addressed during session: ADL's and self-care                       End of Session Equipment Utilized During Treatment: Rolling walker (2 wheels);Gait belt;Oxygen (placed on 3 L in session.) Nurse Communication: Other (comment) (notified of pt's blood pressure; in room at end of session.)  Activity Tolerance: Patient tolerated treatment well Patient left: in chair;with call bell/phone within reach  OT Visit Diagnosis: Unsteadiness on feet (R26.81);Other abnormalities of gait and mobility (R26.89);Muscle weakness (generalized) (M62.81)                Time: 2130-8657 OT Time Calculation (min): 26 min Charges:  OT General Charges $OT Visit: 1 Visit OT Evaluation $OT Eval Low Complexity: 1 Low  Keshon Markovitz OT, MOT  Danie Chandler 02/18/2023, 9:48 AM

## 2023-02-18 NOTE — TOC Progression Note (Signed)
Transition of Care [facility]) - Progression Note    Patient Details  Name: [name] MRN: [id number] Date of Birth: [birth year]  [facility] CM/SW Contact  Elliot Gault, LCSW Phone Number: 02/18/2023, 1:45 PM  Clinical Narrative:     TOC following. PT/OT recommending SNF rehab. Reviewed with pt who is agreeable. CMS provider options reviewed. Will refer as requested and start insurance auth.  Expected Discharge Plan: Skilled Nursing Facility Barriers to Discharge: Continued Medical Work up  Expected Discharge Plan and Services In-house Referral: Clinical Social Work   Post Acute Care Choice: Skilled Nursing Facility Living arrangements for the past 2 months: Single Family Home                                       Social Determinants of Health (SDOH) Interventions SDOH Screenings   Food Insecurity: No Food Insecurity (02/15/2023)  Housing: Patient Declined (02/15/2023)  Transportation Needs: No Transportation Needs (02/15/2023)  Utilities: Not At Risk (02/15/2023)  Depression (PHQ2-9): Low Risk  (04/24/2022)  Financial Resource Strain: Low Risk  (05/19/2019)  Physical Activity: Inactive (05/19/2019)  Social Connections: Unknown (11/28/2021)   [facility]  Stress: No Stress Concern Present (05/19/2019)  Tobacco Use: High Risk (02/15/2023)    Readmission Risk Interventions    02/16/2023    8:31 AM 01/19/2023    1:19 PM 01/13/2023   10:44 AM  Readmission Risk Prevention Plan  Transportation Screening Complete  Complete  HRI or Home Care Consult   Complete  Social Work Consult for Recovery Care Planning/Counseling   Complete  Palliative Care Screening   Not Applicable  Medication Review Oceanographer) Complete  Complete  PCP or Specialist appointment within 3-5 days of discharge  Complete   HRI or Home Care Consult Complete Complete   SW Recovery Care/Counseling Consult Complete Complete   Palliative Care Screening  Not Applicable Not Applicable   Skilled Nursing Facility Not Applicable Not Applicable

## 2023-02-19 DIAGNOSIS — A419 Sepsis, unspecified organism: Secondary | ICD-10-CM | POA: Diagnosis not present

## 2023-02-19 DIAGNOSIS — L0390 Cellulitis, unspecified: Secondary | ICD-10-CM | POA: Diagnosis not present

## 2023-02-19 LAB — BASIC METABOLIC PANEL
Anion gap: 5 (ref 5–15)
BUN: 13 mg/dL (ref 8–23)
CO2: 23 mmol/L (ref 22–32)
Calcium: 8.6 mg/dL — ABNORMAL LOW (ref 8.9–10.3)
Chloride: 104 mmol/L (ref 98–111)
Creatinine, Ser: 1.26 mg/dL — ABNORMAL HIGH (ref 0.61–1.24)
GFR, Estimated: >60 mL/min (ref >60)
Glucose, Bld: 93 mg/dL (ref 70–99)
Potassium: 5.2 mmol/L — ABNORMAL HIGH (ref 3.5–5.1)
Sodium: 132 mmol/L — ABNORMAL LOW (ref 135–145)

## 2023-02-19 LAB — GLUCOSE, CAPILLARY
Glucose-Capillary: 98 mg/dL (ref 70–99)
Glucose-Capillary: 127 mg/dL — ABNORMAL HIGH (ref 70–99)
Glucose-Capillary: 139 mg/dL — ABNORMAL HIGH (ref 70–99)
Glucose-Capillary: 179 mg/dL — ABNORMAL HIGH (ref 70–99)

## 2023-02-19 LAB — LACTIC ACID, PLASMA: Lactic Acid, Venous: 1.4 mmol/L (ref 0.5–1.9)

## 2023-02-19 MED ORDER — SODIUM ZIRCONIUM CYCLOSILICATE 5 G PO PACK
5.0000 g | PACK | Freq: Once | ORAL | Status: AC
  Administered 2023-02-19: 5 g via ORAL
  Filled 2023-02-19: qty 1

## 2023-02-19 NOTE — Progress Notes (Signed)
This nurse went in to assess patient and he looks clammy and can tell he doesn't feel well.. I asked him how he was doing today and he states " I just hurt all over and feel so bad, Im usually hot all the time but today I am cold, can you please get me a warming blanket." VS obtained and they were WNL. Afebrile. New PIV access obtained due to his other one leaking. After medications were given he stated "Can I please just take a nap, I am just so tired."

## 2023-02-19 NOTE — Plan of Care (Signed)
  Problem: Clinical Measurements: Goal: Ability to avoid or minimize complications of infection will improve Outcome: Progressing   Problem: Education: Goal: Knowledge of General Education information will improve Description: Including pain rating scale, medication(s)/side effects and non-pharmacologic comfort measures Outcome: Progressing   Problem: Activity: Goal: Risk for activity intolerance will decrease Outcome: Progressing   Problem: Nutrition: Goal: Adequate nutrition will be maintained Outcome: Progressing   Problem: Pain Managment: Goal: General experience of comfort will improve Outcome: Progressing

## 2023-02-19 NOTE — Plan of Care (Signed)
  Problem: Coping: Goal: Ability to adjust to condition or change in health will improve Outcome: Progressing   

## 2023-02-19 NOTE — Consult Note (Addendum)
[facility] Customer service manager [facility]) Accountable Care Organization (ACO) [facility] Liaison Note  02/19/2023  [name] 1955/09/14 [id number]  Location: [facility] RN Hospital Liaison screened the patient remotely at [facility].  Insurance: [employer]   [name] is a 67 y.o. male who is a Primary Care Patient of [name], [name], FNP. The patient was screened for 30 day readmission hospitalization with noted extreme risk score for unplanned readmission risk with 4 IP/1 ED in 6 months.  The patient was assessed for potential [facility]) Care Management service needs for post hospital transition for care coordination. Review of patient's electronic medical record reveals patient was admitted with Cellulitis 2nd to Sepsis. Pt will go to STR, SNF level of care. [facility] indicates pt is with the [facility] for care management services. Facility will continue to manage pt care post discharged.   [facility] Care Management/Population Health does not replace or interfere with any arrangements made by the Inpatient Transition of Care team.   For questions contact:   Elliot Cousin, RN, [facility] Liaison [place]   Population Health Office Hours MTWF  8:00 am-6:00 pm Off on Thursday [phone] mobile [phone] [Office toll free line] Office Hours are M-F 8:30 - 5 pm 24 hour nurse advise line [phone] [email or web address]  [email or web address]

## 2023-02-19 NOTE — Progress Notes (Signed)
Vital signs back to green mews. Fever resolved. Pt states "I just feel so bad."

## 2023-02-19 NOTE — Care Management Important Message (Signed)
Important Message  Patient Details  Name: [name] MRN: [id number] Date of Birth: 1956/05/30   Medicare Important Message Given:  Yes     Corey Harold 02/19/2023, 3:58 PM

## 2023-02-19 NOTE — Progress Notes (Signed)
   02/19/23 2137  Assess: MEWS Score  Temp (!) 100.5 F (38.1 C) (told rn patient temp is up)  BP (!) 173/77  Pulse Rate 93  SpO2 91 %  O2 Device Nasal Cannula  O2 Flow Rate (L/min) 3 L/min  Assess: MEWS Score  MEWS Temp 1  MEWS Systolic 0  MEWS Pulse 0  MEWS RR 1  MEWS LOC 0  MEWS Score 2  MEWS Score Color Yellow  Assess: if the MEWS score is Yellow or Red  Were vital signs accurate and taken at a resting state? Yes  Does the patient meet 2 or more of the SIRS criteria? Yes  Does the patient have a confirmed or suspected source of infection? Yes  MEWS guidelines implemented  Yes, yellow  Treat  MEWS Interventions Considered administering scheduled or prn medications/treatments as ordered  Take Vital Signs  Increase Vital Sign Frequency  Yellow: Q2hr x1, continue Q4hrs until patient remains green for 12hrs  Escalate  MEWS: Escalate Yellow: Discuss with charge nurse and consider notifying provider and/or RRT  Provider Notification  Provider Name/Title Roque Lias, RN  Date Provider Notified 02/19/23  Time Provider Notified 2137  Method of Notification  (self)  Provider response See new orders  Date of Provider Response 02/19/23  Time of Provider Response 2145  Assess: SIRS CRITERIA  SIRS Temperature  0  SIRS Pulse 1  SIRS Respirations  1  SIRS WBC 0  SIRS Score Sum  2

## 2023-02-19 NOTE — TOC Progression Note (Addendum)
Transition of Care [facility]) - Progression Note    Patient Details  Name: [name] MRN: [id number] Date of Birth: 11/22/1955  Transition of Care [facility]) CM/SW Contact  Elliot Gault, LCSW Phone Number: 02/19/2023, 1:56 PM  Clinical Narrative:     TOC following. Met with pt at bedside to update on SNF bed offer from [facility]. Pt accepts the offer. Updated insurance auth.  Anticipating weekend DC. TOC will follow.  1546: Pt has insurance auth for SNF. MD anticipating dc Saturday. Message left for Debbie at [facility] to inform. Weekend TOC will follow.  Expected Discharge Plan: Skilled Nursing Facility Barriers to Discharge: Continued Medical Work up  Expected Discharge Plan and Services In-house Referral: Clinical Social Work   Post Acute Care Choice: Skilled Nursing Facility Living arrangements for the past 2 months: Single Family Home                                       Social Determinants of Health (SDOH) Interventions SDOH Screenings   Food Insecurity: No Food Insecurity (02/15/2023)  Housing: Patient Declined (02/15/2023)  Transportation Needs: No Transportation Needs (02/15/2023)  Utilities: Not At Risk (02/15/2023)  Depression (PHQ2-9): Low Risk  (04/24/2022)  Financial Resource Strain: Low Risk  (05/19/2019)  Physical Activity: Inactive (05/19/2019)  Social Connections: Unknown (11/28/2021)   Received from [facility]  Stress: No Stress Concern Present (05/19/2019)  Tobacco Use: High Risk (02/15/2023)    Readmission Risk Interventions    02/16/2023    8:31 AM 01/19/2023    1:19 PM 01/13/2023   10:44 AM  Readmission Risk Prevention Plan  Transportation Screening Complete  Complete  HRI or Home Care Consult   Complete  Social Work Consult for Recovery Care Planning/Counseling   Complete  Palliative Care Screening   Not Applicable  Medication Review Oceanographer) Complete  Complete  PCP or Specialist appointment within 3-5  days of discharge  Complete   HRI or Home Care Consult Complete Complete   SW Recovery Care/Counseling Consult Complete Complete   Palliative Care Screening Not Applicable Not Applicable   Skilled Nursing Facility Not Applicable Not Applicable

## 2023-02-19 NOTE — Progress Notes (Addendum)
[facility] PROGRESS NOTE  [name] (DOB: 1955-11-18) AOZ:[id number] PCP: Genia Hotter, FNP  Brief Narrative: 67 y.o. 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 No new issues. Denies shortness of breath.  Objective: BP (!) 134/55 (BP Location: Right Arm)   Pulse 73   Temp 98.6 F (37 C) (Oral)   Resp (!) 24   Ht 5\' 9"  (1.753 m)   Wt 91.9 kg   SpO2 93%   BMI 29.92 kg/m   Gen: Frail elderly male in no distress Pulm: Clear, nonlabored   CV: RRR GI: Soft, NT, ND, +BS  Neuro: Alert and oriented. No new focal deficits. Ext: Warm, dry. Diminished RLE DP pulse. L TMA stable. Skin: Right foot with blanchable erythema mildly tender to palpation without induration or fluctuance. No underlying wound. Left anterior shoulder surgical scar well healed, nylon running suture removed. No other/new rashes, lesions or ulcers on visualized skin   Assessment & Plan: Sepsis due to cellulitis:  - Continue ceftriaxone. Recurrently febrile 8/1 though WBC remained normalized. No recurrence in past 24 hours. Mural thrombus could be source of fevers at times, will monitor closely.  - Blood cultures NGTD x4 days.   - Lactic acidosis cleared.    PAD: Right ABI 0.69 (moderate), Left ABI normal.   - Continue atorvastatin, ranexa and plavix  - Patient with prior transmetatarsal amputation of the left foot - Increases risk of treatment failure.   COPD/emphysema/tobacco abuse: No wheezing.  - Continue bronchodilators - Continue nicotine patch   Hypokalemia/hypomagnesemia: Coming down while  holding supplement.  - Continue monitoring. Lokelma x1  AKI: ?CIN - Showing improvement. CrCl now >32ml/min.   Aortic mural thrombus: As seen on CTA chest - Consulted cardiology who advised to continue patient's Plavix but no need for anticoagulation as it would increase the risk of embolic events to the lower extremities -Continue Ranexa and Plavix   Chronic hypoxic respiratory failure, ILD: On 6L O2, says he's been on this since 2015.  - At baseline.   OSA:  - CPAP qHS  Anxiety:  - Continue alprazolam and prozac   HTN:  - Holding diltiazem  IDT2DM with diabetic peripheral neuropathy: HbA1c 9.3%.  - Continue reduced basal-bolus regimen here.  - Continue lyrica    GERD:  - PPI  Moderate protein calorie malnutrition:  - Supp protein  s/p left shoulder hemiplasty/reduction of chronic dislocation 6/26 by Dr. Everardo Pacific:  - Continue pain control, PT - I removed sutures without incident today  Debility: Pt has marked weakness diffusely - PT/OT consulted > SNF rehabilitation being pursued.   Tyrone Nine, MD [facility] [email or web address] 02/19/2023, 5:51 PM

## 2023-02-20 ENCOUNTER — Inpatient Hospital Stay (HOSPITAL_COMMUNITY): Payer: 59

## 2023-02-20 DIAGNOSIS — L0390 Cellulitis, unspecified: Secondary | ICD-10-CM | POA: Diagnosis not present

## 2023-02-20 DIAGNOSIS — A419 Sepsis, unspecified organism: Secondary | ICD-10-CM | POA: Diagnosis not present

## 2023-02-20 LAB — GLUCOSE, CAPILLARY
Glucose-Capillary: 93 mg/dL (ref 70–99)
Glucose-Capillary: 128 mg/dL — ABNORMAL HIGH (ref 70–99)
Glucose-Capillary: 164 mg/dL — ABNORMAL HIGH (ref 70–99)
Glucose-Capillary: 162 mg/dL — ABNORMAL HIGH (ref 70–99)

## 2023-02-20 MED ORDER — VANCOMYCIN HCL IN DEXTROSE 1-5 GM/200ML-% IV SOLN
1000.0000 mg | INTRAVENOUS | Status: DC
  Administered 2023-02-21 – 2023-02-23 (×3): 1000 mg via INTRAVENOUS
  Filled 2023-02-20 (×4): qty 200

## 2023-02-20 MED ORDER — VANCOMYCIN HCL 2000 MG/400ML IV SOLN
2000.0000 mg | Freq: Once | INTRAVENOUS | Status: AC
  Administered 2023-02-20: 2000 mg via INTRAVENOUS
  Filled 2023-02-20: qty 400

## 2023-02-20 NOTE — TOC Progression Note (Signed)
Transition of Care [facility]) - Progression Note    Patient Details  Name: [name] MRN: [id number] Date of Birth: 10/02/1955  [facility]) CM/SW Contact  Catalina Gravel, LCSW Phone Number: 02/20/2023, 9:58 AM  Clinical Narrative:    Pt has Auth until 8/5 to [facility], was DC ready- developed fever. CSW contacted Debbie at [facility] advise dt not DC ready.  Verified if pt should be DC ready tomorrow-on Sunday,  they can accept. TOC to follow.     Expected Discharge Plan: Skilled Nursing Facility Barriers to Discharge: Continued Medical Work up  Expected Discharge Plan and Services In-house Referral: Clinical Social Work   Post Acute Care Choice: Skilled Nursing Facility Living arrangements for the past 2 months: Single Family Home                                       Social Determinants of Health (SDOH) Interventions SDOH Screenings   Food Insecurity: No Food Insecurity (02/15/2023)  Housing: Patient Declined (02/15/2023)  Transportation Needs: No Transportation Needs (02/15/2023)  Utilities: Not At Risk (02/15/2023)  Depression (PHQ2-9): Low Risk  (04/24/2022)  Financial Resource Strain: Low Risk  (05/19/2019)  Physical Activity: Inactive (05/19/2019)  Social Connections: Unknown (11/28/2021)   Received from [facility]  Stress: No Stress Concern Present (05/19/2019)  Tobacco Use: High Risk (02/15/2023)    Readmission Risk Interventions    02/16/2023    8:31 AM 01/19/2023    1:19 PM 01/13/2023   10:44 AM  Readmission Risk Prevention Plan  Transportation Screening Complete  Complete  HRI or Home Care Consult   Complete  Social Work Consult for Recovery Care Planning/Counseling   Complete  Palliative Care Screening   Not Applicable  Medication Review Oceanographer) Complete  Complete  PCP or Specialist appointment within 3-5 days of discharge  Complete   HRI or Home Care Consult Complete Complete   SW Recovery Care/Counseling Consult  Complete Complete   Palliative Care Screening Not Applicable Not Applicable   Skilled Nursing Facility Not Applicable Not Applicable

## 2023-02-20 NOTE — Progress Notes (Signed)
Pharmacy Antibiotic Note  [name] is a 67 y.o. male admitted on 02/15/2023 with  cellulitis .  Pharmacy has been consulted for vancomycin dosing.  Tmax of 100.5 overnight, wbc wnl. Cultures no growth to date. With continued pain and fevers today will add vancomycin.   Vancomycin 2000 mg IVx1 then 1g Q 24 hrs. Goal AUC 400-550. Expected AUC: 445 SCr used: 1.34   Height: 5\' 9"  (175.3 cm) Weight: 91.9 kg (202 lb 9.6 oz) IBW/kg (Calculated) : 70.7  Temp (24hrs), Avg:99.1 F (37.3 C), Min:98.3 F (36.8 C), Max:100.5 F (38.1 C)  Recent Labs  Lab 02/15/23 1200 02/15/23 1323 02/15/23 2237 02/16/23 0431 02/17/23 0441 02/18/23 0756 02/19/23 0819 02/19/23 2252 02/20/23 0432  WBC 22.2*  --   --  13.7* 9.4 7.8  --   --   --   CREATININE 1.39*  --    < > 1.15 1.42* 1.43* 1.26*  --  1.34*  LATICACIDVEN 3.1* 1.7  --   --   --   --   --  1.4  --    < > = values in this interval not displayed.    Estimated Creatinine Clearance: 59.9 mL/min (A) (by C-G formula based on SCr of 1.34 mg/dL (H)).    Allergies  Allergen Reactions   Lisinopril Other (See Comments)    Other reaction(s): worsening kidney function   Pollen Extract     Other reaction(s): sneezing   Oxycodone Itching and Nausea Only    Pt is able to tolerate if taken with benadryl  Thank you for allowing pharmacy to be a part of this patient's care.  Sheppard Coil PharmD., BCPS Clinical Pharmacist 02/20/2023 1:41 PM

## 2023-02-20 NOTE — Progress Notes (Signed)
Pt is yellow MEWS due to RR and temp.  PRN Tylenol and Morphine given.  Pt resting comfortably at present time.  Pt had been yellow MEWS on 8/2 for similar symptoms.  Will escalate as needed.    02/20/23 1955  Assess: MEWS Score  Temp (!) 100.8 F (38.2 C)  BP (!) 152/71  MAP (mmHg) 94  Pulse Rate 91  Resp (!) 28  Level of Consciousness Alert  SpO2 96 %  O2 Device Nasal Cannula  Patient Activity (if Appropriate) In bed  O2 Flow Rate (L/min) 4 L/min  Assess: MEWS Score  MEWS Temp 1  MEWS Systolic 0  MEWS Pulse 0  MEWS RR 2  MEWS LOC 0  MEWS Score 3  MEWS Score Color Yellow  Assess: if the MEWS score is Yellow or Red  Were vital signs accurate and taken at a resting state? Yes  Does the patient meet 2 or more of the SIRS criteria? Yes  Does the patient have a confirmed or suspected source of infection? Yes  MEWS guidelines implemented  Yes, yellow  Treat  MEWS Interventions Considered administering scheduled or prn medications/treatments as ordered  Take Vital Signs  Increase Vital Sign Frequency  Yellow: Q2hr x1, continue Q4hrs until patient remains green for 12hrs  Escalate  MEWS: Escalate Yellow: Discuss with charge nurse and consider notifying provider and/or RRT  Notify: Charge Nurse/RN  Name of Charge Nurse/RN Notified Tracey RN  Assess: SIRS CRITERIA  SIRS Temperature  0  SIRS Pulse 1  SIRS Respirations  1  SIRS WBC 0  SIRS Score Sum  2   Bear Stearns BSN RN [facility] 02/20/2023, 8:25 PM

## 2023-02-20 NOTE — Progress Notes (Signed)
[facility] PROGRESS NOTE  [name] (DOB: [birth year]) ZOX:[id number] PCP: Genia Hotter, FNP  Brief Narrative: 67 y.o. 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 The patient's pain has not improved, recurrently febrile, so vancomycin is added. Blood culture repeated.   Subjective: Just feels unwell, feels like he may have a fever now but measured temperature is normal. No dyspnea or chest pain. His left shoulder hurts still but is at baseline. No numbness, able to move the foot. Hurts to put weight on it starting this morning.   Objective: BP (!) 131/59 (BP Location: Right Arm)   Pulse 72   Temp 98.3 F (36.8 C) (Oral)   Resp 18   Ht 5\' 9"  (1.753 m)   Wt 91.9 kg   SpO2 93%   BMI 29.92 kg/m   Gen: Frail male appearing chronically ill without acute distress Pulm: Clear, nonlabored on 6L O2  CV: RRR, no MRG or pitting edema GI: Soft, NT, ND, +BS  Neuro: Alert and oriented. No new focal deficits. Ext/Skin: Varicosities BL LE's. Right foot with dorsal nearly unchanged erythema that is tender. Developing bulla on plantar aspect with more anterior black area. Left TMA well healed, both LE's are warm with intact cap refill. No toe discolorations.   Assessment & Plan: Sepsis due to cellulitis:  - Continue ceftriaxone.  - Recurrently febrile 8/1 and again overnight last night. Aortic mural thrombus could be causing these. - We'll monitor the repeat blood culture drawn overnight, add MRSA coverage with vancomycin and monitor. If not improving, would consider more advanced imaging with MRI to r/o deeper infection on 8/5.  - Blood cultures 7/29 negative,  blood culture 8/2 NGTD.     PAD: Right ABI 0.69 (moderate), Left ABI normal. Increases risk of treatment failure.  - Continue atorvastatin, ranexa and plavix  - Patient with prior transmetatarsal amputation of the left foot  COPD/emphysema/tobacco abuse: No wheezing.  - Continue bronchodilators - Continue nicotine patch   Hypokalemia/hypomagnesemia: Normalized   - Continue monitoring   AKI:  - Showing improvement. CrCl now >42ml/min.   Aortic mural thrombus: As seen on CTA chest.  - Studies do not show a change in embolization risk with chronic anticoagulation and higher risk of destabilizing clot with thrombolysis. We are continuing plavix.   Chronic hypoxic respiratory failure, ILD: On 6L O2, says he's been on this since 2015.  - At baseline.   OSA:  - CPAP qHS  Anxiety:  - Continue alprazolam and prozac   HTN:  - Holding diltiazem  IDT2DM with diabetic peripheral neuropathy: HbA1c 9.3%.  - Continue reduced basal-bolus regimen here.  - Continue lyrica    GERD:  - PPI  Moderate protein calorie malnutrition:  - Supp protein  s/p left shoulder hemiplasty/reduction of chronic dislocation 6/26 by Dr. Everardo Pacific:  - Continue pain control, PT  Debility: Pt has marked weakness diffusely - PT/OT consulted > SNF rehabilitation being pursued.   Tyrone Nine, MD [facility] [email or web address] 02/20/2023, 12:34 PM

## 2023-02-20 NOTE — Progress Notes (Signed)
2000 Patient stated he did not feel good. He generally does not look well. Eyes are red and watery.   2137 Patient has an axillary temp of 100.5. tylenol given. Respirations 34.  At this time he meets 2 criteria for SIRS. I notified Dr. Carren Rang. New orders were obtained.   2250 temp 99.5 and patient is resting quietly with eyes closed. Respirations are coming down as well  0400 Asked patient to stand at side of bed and try to urinate. He had not urinated all night. Patient urinated .  He ask for a cup of coffee and I gave him decaffeinated. He talks about his family and feelings towards them.

## 2023-02-21 ENCOUNTER — Inpatient Hospital Stay (HOSPITAL_COMMUNITY): Payer: 59

## 2023-02-21 DIAGNOSIS — L03115 Cellulitis of right lower limb: Secondary | ICD-10-CM

## 2023-02-21 DIAGNOSIS — D649 Anemia, unspecified: Secondary | ICD-10-CM | POA: Diagnosis not present

## 2023-02-21 DIAGNOSIS — A419 Sepsis, unspecified organism: Secondary | ICD-10-CM | POA: Diagnosis not present

## 2023-02-21 DIAGNOSIS — L0390 Cellulitis, unspecified: Secondary | ICD-10-CM | POA: Diagnosis not present

## 2023-02-21 LAB — COMPREHENSIVE METABOLIC PANEL
ALT: 14 U/L (ref 0–44)
AST: 12 U/L — ABNORMAL LOW (ref 15–41)
Albumin: 2.4 g/dL — ABNORMAL LOW (ref 3.5–5.0)
Alkaline Phosphatase: 82 U/L (ref 38–126)
Anion gap: 8 (ref 5–15)
BUN: 16 mg/dL (ref 8–23)
CO2: 28 mmol/L (ref 22–32)
Calcium: 8.7 mg/dL — ABNORMAL LOW (ref 8.9–10.3)
Chloride: 98 mmol/L (ref 98–111)
Creatinine, Ser: 1.35 mg/dL — ABNORMAL HIGH (ref 0.61–1.24)
GFR, Estimated: 58 mL/min — ABNORMAL LOW (ref >60)
Glucose, Bld: 158 mg/dL — ABNORMAL HIGH (ref 70–99)
Potassium: 4.8 mmol/L (ref 3.5–5.1)
Sodium: 134 mmol/L — ABNORMAL LOW (ref 135–145)
Total Bilirubin: 0.3 mg/dL (ref 0.3–1.2)
Total Protein: 7 g/dL (ref 6.5–8.1)

## 2023-02-21 LAB — GLUCOSE, CAPILLARY
Glucose-Capillary: 130 mg/dL — ABNORMAL HIGH (ref 70–99)
Glucose-Capillary: 112 mg/dL — ABNORMAL HIGH (ref 70–99)
Glucose-Capillary: 135 mg/dL — ABNORMAL HIGH (ref 70–99)
Glucose-Capillary: 198 mg/dL — ABNORMAL HIGH (ref 70–99)

## 2023-02-21 LAB — CBC
HCT: 33.7 % — ABNORMAL LOW (ref 39.0–52.0)
Hemoglobin: 10.6 g/dL — ABNORMAL LOW (ref 13.0–17.0)
MCH: 26.5 pg (ref 26.0–34.0)
MCHC: 31.5 g/dL (ref 30.0–36.0)
MCV: 84.3 fL (ref 80.0–100.0)
Platelets: 240 10*3/uL (ref 150–400)
RBC: 4 MIL/uL — ABNORMAL LOW (ref 4.22–5.81)
RDW: 14.4 % (ref 11.5–15.5)
WBC: 10.5 10*3/uL (ref 4.0–10.5)
nRBC: 0 % (ref 0.0–0.2)

## 2023-02-21 MED ORDER — GADOBUTROL 1 MMOL/ML IV SOLN
10.0000 mL | Freq: Once | INTRAVENOUS | Status: AC | PRN
  Administered 2023-02-21: 10 mL via INTRAVENOUS

## 2023-02-21 NOTE — Progress Notes (Signed)
1339 Checked on patient  to see if he needed a PRN nebulizer and he appeared to sleeping comfortably.

## 2023-02-21 NOTE — Progress Notes (Signed)
[facility] PROGRESS NOTE   [name]:[id number] DOB: [birth year] DOA: 02/15/2023  PCP: Genia Hotter, FNP  Brief History/Interval Summary: 67 y.o. 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 The patient's pain has not improved, recurrently febrile, so vancomycin is added. Blood culture repeated.   Consultants: None yet  Procedures: None    Subjective/Interval History: Patient mentions that his right foot does not getting any better.  Complains of left shoulder pain as well but that is at baseline.  No new complaints offered.    Assessment/Plan:  Right foot cellulitis/, sepsis, present on admission Patient was started on ceftriaxone.  Continue to have febrile episodes so vancomycin was added on 8/3. Blood cultures from 7/29 negative.  Blood cultures were repeated and negative so far. We will proceed with MRI of the right foot. WBC is noted to be normal as of 3 days ago.  Will recheck tomorrow.  Will also check procalcitonin levels.  PAD Right ABI 0.69 (moderate), Left ABI normal. Increases risk of treatment failure.  - Continue atorvastatin, ranexa and plavix  - Patient with prior transmetatarsal amputation of the left foot   COPD/emphysema/tobacco abuse: - Continue bronchodilators - Continue nicotine patch Stable for the most part.   Hypokalemia/hypomagnesemia/hyponatremia Monitor metabolic panel periodically.     AKI:  Unable to truly ascertain his baseline as his creatinine has been fluctuating between 1.0-2.0. He is likely at his baseline now.   Aortic mural thrombus:  As seen on CTA chest.  - Studies do not show a change  in embolization risk with chronic anticoagulation and higher risk of destabilizing clot with thrombolysis. We are continuing plavix.  Normocytic anemia Likely has anemia of chronic disease.  Some drop in hemoglobin is noted during this hospital stay.  No overt bleeding identified.  Will recheck labs tomorrow.   Chronic hypoxic respiratory failure, ILD On 6L O2, says he's been on this since 2015.  - At baseline.    OSA:  - CPAP qHS   Anxiety:  - Continue alprazolam and prozac   Essential hypertension:  - Holding diltiazem   IDT2DM with diabetic peripheral neuropathy: HbA1c 9.3%.  - Continue reduced basal-bolus regimen here.  - Continue lyrica    GERD:  - PPI   Moderate protein calorie malnutrition:  - Supp protein   s/p left shoulder hemiplasty/reduction of chronic dislocation 6/26 by Dr. Everardo Pacific:  - Continue pain control, PT   Debility: Pt has marked weakness diffusely - PT/OT consulted > SNF rehabilitation being pursued.   DVT Prophylaxis: Subcutaneous heparin Code Status: DNR Family Communication: Discussed with patient Disposition Plan: To SNF when medically improved     Medications: Scheduled:  ALPRAZolam  1 mg Oral TID   atorvastatin  20 mg Oral QHS   clopidogrel  75 mg Oral Daily   dextromethorphan-guaiFENesin  1 tablet Oral BID   feeding supplement (GLUCERNA SHAKE)  237 mL Oral TID BM   FLUoxetine  40 mg Oral Daily   heparin  5,000 Units Subcutaneous Q8H   insulin aspart  0-15 Units Subcutaneous TID WC   insulin aspart  0-5 Units Subcutaneous QHS   insulin detemir  20 Units Subcutaneous QHS   ipratropium-albuterol  3 mL Nebulization TID  isosorbide mononitrate  60 mg Oral Daily   And   isosorbide mononitrate  30 mg Oral QHS   midodrine  2.5 mg Oral BID   multivitamin with minerals  1 tablet Oral Daily   nicotine  21 mg Transdermal Daily   pantoprazole  40 mg Oral Daily   ranolazine  500 mg Oral BID   tamsulosin  0.4 mg Oral QPC supper    Continuous:  cefTRIAXone (ROCEPHIN)  IV Stopped (02/20/23 1532)   lactated ringers 75 mL/hr at 02/21/23 0439   vancomycin     JXB:[id number] **OR** acetaminophen, albuterol, gadobutrol, loperamide, morphine injection, ondansetron **OR** ondansetron (ZOFRAN) IV, oxyCODONE, zolpidem  Antibiotics: Anti-infectives (From admission, onward)    Start     Dose/Rate Route Frequency Ordered Stop   02/21/23 1400  vancomycin (VANCOCIN) IVPB 1000 mg/200 mL premix        1,000 mg 200 mL/hr over 60 Minutes Intravenous Every 24 hours 02/20/23 1342     02/20/23 1430  vancomycin (VANCOREADY) IVPB 2000 mg/400 mL        2,000 mg 200 mL/hr over 120 Minutes Intravenous  Once 02/20/23 1342 02/20/23 1739   02/16/23 1200  cefTRIAXone (ROCEPHIN) 1 g in sodium chloride 0.9 % 100 mL IVPB        1 g 200 mL/hr over 30 Minutes Intravenous Every 24 hours 02/15/23 1954 02/23/23 1159   02/15/23 1215  cefTRIAXone (ROCEPHIN) 1 g in sodium chloride 0.9 % 100 mL IVPB        1 g 200 mL/hr over 30 Minutes Intravenous  Once 02/15/23 1200 02/15/23 1238       Objective:  Vital Signs  Vitals:   02/20/23 2308 02/21/23 0136 02/21/23 0426 02/21/23 0835  BP: (!) 149/69 (!) 138/57 (!) 140/64   Pulse: 87 83 85   Resp: (!) 24 (!) 23 20   Temp: 99.5 F (37.5 C) 98.3 F (36.8 C) 98.3 F (36.8 C)   TempSrc: Oral Oral Oral   SpO2: 98% 94% 95% 95%  Weight:      Height:        Intake/Output Summary (Last 24 hours) at 02/21/2023 0949 Last data filed at 02/21/2023 0900 Gross per 24 hour  Intake 2808.33 ml  Output 450 ml  Net 2358.33 ml   Filed Weights   02/15/23 1142 02/15/23 1900  Weight: 90.7 kg 91.9 kg    General appearance: Awake alert.  In no distress Resp: Effort at rest.  Crackles bases.  No wheezing or rhonchi. Cardio: S1-S2 is normal regular.  No S3-S4.  No rubs murmurs or bruit GI: Abdomen is soft.  Nontender nondistended.  Bowel sounds are present normal.  No masses organomegaly Extremities: Noted  over dorsal aspect of right foot.  Warm to touch.  Poorly palpable pulses. Neurologic: Alert and oriented x3.  No focal neurological deficits.    Lab Results:  Data Reviewed: I have personally reviewed following labs and reports of the imaging studies  CBC: Recent Labs  Lab 02/15/23 1200 02/16/23 0431 02/17/23 0441 02/18/23 0756  WBC 22.2* 13.7* 9.4 7.8  NEUTROABS 19.5* 11.5*  --   --   HGB 13.3 10.5* 9.2* 9.0*  HCT 40.5 32.4* 29.0* 28.2*  MCV 81.0 82.7 84.8 83.9  PLT 197 162 159 177    Basic Metabolic Panel: Recent Labs  Lab 02/15/23 2237 02/16/23 0431 02/17/23 0441 02/18/23 0756 02/19/23 0819 02/20/23 0432  NA 134* 136 133* 130* 132* 134*  K 2.5* 3.7 4.7 5.3* 5.2*  5.0  CL 108 111 110 106 104 100  CO2 20* 21* 18* 19* 23 23  GLUCOSE 203* 99 134* 124* 93 122*  BUN 17 16 16 15 13 16   CREATININE 1.23 1.15 1.42* 1.43* 1.26* 1.34*  CALCIUM 7.3* 7.7* 7.8* 8.2* 8.6* 8.8*  MG 1.1* 1.3*  --   --   --   --   PHOS  --   --  2.1* 2.4*  --   --     GFR: Estimated Creatinine Clearance: 59.9 mL/min (A) (by C-G formula based on SCr of 1.34 mg/dL (H)).  Liver Function Tests: Recent Labs  Lab 02/15/23 1200 02/16/23 0431 02/17/23 0441 02/18/23 0756  AST 14* 9*  --   --   ALT 13 10  --   --   ALKPHOS 84 61  --   --   BILITOT 1.5* 0.3  --   --   PROT 7.4 5.7*  --   --   ALBUMIN 3.2* 2.4* 2.2* 2.2*    Coagulation Profile: Recent Labs  Lab 02/15/23 1200  INR 1.1    CBG: Recent Labs  Lab 02/20/23 0703 02/20/23 1110 02/20/23 1637 02/20/23 2122 02/21/23 0816  GLUCAP 93 128* 164* 162* 130*     Recent Results (from the past 240 hour(s))  Blood Culture (routine x 2)     Status: None   Collection Time: 02/15/23 11:50 AM   Specimen: BLOOD  Result Value Ref Range Status   Specimen Description BLOOD BLOOD RIGHT WRIST  Final   Special Requests   Final    BOTTLES DRAWN AEROBIC AND ANAEROBIC Blood Culture adequate volume   Culture   Final    NO GROWTH 5  DAYS Performed at [facility], [address], [place], [place] [address]    Report Status 02/20/2023 FINAL  Final  Blood Culture (routine x 2)     Status: None   Collection Time: 02/15/23 11:55 AM   Specimen: BLOOD  Result Value Ref Range Status   Specimen Description BLOOD RIGHT ANTECUBITAL  Final   Special Requests   Final    BOTTLES DRAWN AEROBIC AND ANAEROBIC Blood Culture adequate volume   Culture   Final    NO GROWTH 5 DAYS Performed at [facility], [address], [place], [place] [address]    Report Status 02/20/2023 FINAL  Final  Culture, blood (single) w Reflex to ID Panel     Status: None (Preliminary result)   Collection Time: 02/19/23 10:52 PM   Specimen: Right Antecubital; Blood  Result Value Ref Range Status   Specimen Description RIGHT ANTECUBITAL  Final   Special Requests   Final    BOTTLES DRAWN AEROBIC AND ANAEROBIC Blood Culture results may not be optimal due to an excessive volume of blood received in culture bottles   Culture   Final    NO GROWTH 2 DAYS Performed at [facility], [address], [place], [place] [address]    Report Status PENDING  Incomplete      Radiology Studies: DG Foot 2 Views Right  Result Date: 02/20/2023 CLINICAL DATA:  Soft tissue infection. EXAM: RIGHT FOOT - 2 VIEW COMPARISON:  None Available. FINDINGS: There is mild diffuse soft tissue swelling. Small plantar and tiny posterior calcaneal heel spurs. The cortices are intact. Moderate hallux valgus. No cortical erosion is seen.  No acute fracture or dislocation. IMPRESSION: Mild diffuse soft tissue swelling. No cortical erosion is seen to indicate radiographic evidence of acute osteomyelitis. Electronically Signed  By: Neita Garnet M.D.   On: 02/20/2023 16:20       LOS: 6 days    Rito Ehrlich  [facility] Pager on [email or web address]  02/21/2023, 9:49 AM

## 2023-02-21 NOTE — Progress Notes (Signed)
Pt came back from MRI and stated that he needed to go to the bathroom. Assisted pt out of w.c and assisted him to the restroom so he could void. When pt stood up his face went pale and was staring past this nurse like something was wrong. This nurse ask patient if he was okay he didn't respond to this nurse. I asked again and he said "yeah". This nurse then asked "Can you see me? How do you feel right now?' He said " I can kind of see your face but it blurry and I feel dizzy." This nurse hit her duress button for assistance to get him back to bed. After a minute past he said he feels better now and no longer feels dizzy. Vitals are below after getting back to bed. Notified Dr. Rito Ehrlich via phone and stated to obtain orthostatics in 45 minutes.     02/21/23 1000  Vitals  Temp 97.8 F (36.6 C)  Temp Source Oral  BP (!) 176/80  MAP (mmHg) 113  BP Location Right Arm  BP Method Automatic  Patient Position (if appropriate) Lying  Pulse Rate 91  Pulse Rate Source Monitor  Level of Consciousness  Level of Consciousness Alert  MEWS COLOR  MEWS Score Color Green  Oxygen Therapy  SpO2 100 %  O2 Device Nasal Cannula  O2 Flow Rate (L/min) 3 L/min  MEWS Score  MEWS Temp 0  MEWS Systolic 0  MEWS Pulse 0  MEWS RR 0  MEWS LOC 0  MEWS Score 0

## 2023-02-22 DIAGNOSIS — D649 Anemia, unspecified: Secondary | ICD-10-CM | POA: Diagnosis not present

## 2023-02-22 DIAGNOSIS — A419 Sepsis, unspecified organism: Secondary | ICD-10-CM | POA: Diagnosis not present

## 2023-02-22 DIAGNOSIS — Z515 Encounter for palliative care: Secondary | ICD-10-CM | POA: Diagnosis not present

## 2023-02-22 DIAGNOSIS — Z7189 Other specified counseling: Secondary | ICD-10-CM

## 2023-02-22 DIAGNOSIS — L0390 Cellulitis, unspecified: Secondary | ICD-10-CM | POA: Diagnosis not present

## 2023-02-22 DIAGNOSIS — L03115 Cellulitis of right lower limb: Secondary | ICD-10-CM | POA: Diagnosis not present

## 2023-02-22 LAB — GLUCOSE, CAPILLARY
Glucose-Capillary: 120 mg/dL — ABNORMAL HIGH (ref 70–99)
Glucose-Capillary: 199 mg/dL — ABNORMAL HIGH (ref 70–99)
Glucose-Capillary: 143 mg/dL — ABNORMAL HIGH (ref 70–99)
Glucose-Capillary: 154 mg/dL — ABNORMAL HIGH (ref 70–99)

## 2023-02-22 NOTE — Progress Notes (Signed)
Mobility Specialist Progress Note:    02/22/23 1000  Mobility  Activity Dangled on edge of bed  Level of Assistance Moderate assist, patient does 50-74%  Assistive Device None  Distance Ambulated (ft) 0 ft  Range of Motion/Exercises Active;All extremities  LUE Weight Bearing NWB  Activity Response Tolerated well  Mobility Referral Yes  $Mobility charge 1 Mobility  Mobility Specialist Start Time (ACUTE ONLY) 0930  Mobility Specialist Stop Time (ACUTE ONLY) 0940  Mobility Specialist Time Calculation (min) (ACUTE ONLY) 10 min   Pt was received in bed, nurse requested orthostatics. See BP stats below. Deferred standing EOB d/t dizziness and blurry vision. Nurse notified, pt assisted back into bed. Left with all needs met, nursing staff in room.   Supine BP: 152/62 (89) Dangle EOB: 157/74 (99)     Mobility Specialist Please contact via Special educational needs teacher or  Rehab office at [phone]

## 2023-02-22 NOTE — Progress Notes (Signed)
[facility] PROGRESS NOTE   [name] JXB:[id number] DOB: 1956/04/06 DOA: 02/15/2023  PCP: Genia Hotter, FNP  Brief History/Interval Summary: 67 y.o. 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 The patient's pain has not improved, recurrently febrile, so vancomycin is added. Blood culture repeated.   Consultants: None yet  Procedures: None    Subjective/Interval History: Patient mentions that the right foot feels better.  Not as painful as before.     Assessment/Plan:  Right foot cellulitis/, sepsis, present on admission Patient was started on ceftriaxone.  Continue to have febrile episodes so vancomycin was added on 8/3. Blood cultures from 7/29 negative.  Blood cultures were repeated and negative so far. MRI of the right foot did not show any deep abscess.  Findings did suggest cellulitis.  No vascular compromise noted on physical examination. The dorsal aspect of the foot actually looks much better today compared to yesterday with less erythema. WBC remains normal.  Procalcitonin 0.13. Continue ceftriaxone and vancomycin for now as the patient appears to be improving.  PAD Right ABI 0.69 (moderate), Left ABI normal. Increases risk of treatment failure.  - Continue atorvastatin, ranexa and plavix  - Patient with prior transmetatarsal amputation of the left foot No obvious vascular compromise noted on examination.  His cellulitis appears to be improving.  He can follow-up with vascular surgery in the outpatient setting.  If however his symptoms recur then will have low threshold to do a CT angiogram of right lower extremity.    COPD/emphysema/tobacco abuse: - Continue bronchodilators - Continue nicotine patch Stable for the most part.   Hypokalemia/hypomagnesemia/hyponatremia Monitor metabolic panel periodically.  Sodium and potassium level noted to be normal today.   AKI:  Unable to truly ascertain his baseline as his creatinine has been fluctuating between 1.0-2.0. He is likely at his baseline now.   Aortic mural thrombus:  As seen on CTA chest. Studies do not show a change in embolization risk with chronic anticoagulation and higher risk of destabilizing clot with thrombolysis. We are continuing plavix.  Normocytic anemia Likely has anemia of chronic disease.  Some drop in hemoglobin is noted during this hospital stay.  Hemoglobin stable.   Chronic hypoxic respiratory failure, ILD On 6L O2, says he's been on this since 2015.  - At baseline.    OSA:  - CPAP qHS   Anxiety:  - Continue alprazolam and prozac   Essential hypertension:  Diltiazem was placed on hold.  Blood pressure trends and noted to be high.   However he has had episodes suggestive of orthostatic hypotension.  Will wait for orthostatics to be done today before resuming his medications.   IDT2DM with diabetic peripheral neuropathy: HbA1c 9.3%.  - Continue reduced basal-bolus regimen here.  - Continue lyrica    GERD:  - PPI   Moderate protein calorie malnutrition:  - Supp protein   s/p left shoulder hemiplasty/reduction of chronic dislocation 6/26 by Dr. Everardo Pacific:  - Continue pain control, PT   Debility: Pt has marked weakness diffusely - PT/OT consulted > SNF rehabilitation being pursued.   DVT Prophylaxis: Subcutaneous heparin Code Status: DNR Family Communication: Discussed with patient Disposition Plan: To SNF when medically improved     Medications: Scheduled:  ALPRAZolam  1 mg Oral TID   atorvastatin  20 mg Oral QHS   clopidogrel  75 mg Oral Daily   dextromethorphan-guaiFENesin  1 tablet Oral BID   feeding  supplement (GLUCERNA SHAKE)  237 mL Oral TID BM   FLUoxetine  40 mg Oral Daily   heparin  5,000 Units Subcutaneous Q8H   insulin aspart  0-15 Units Subcutaneous TID WC   insulin aspart  0-5 Units Subcutaneous QHS   insulin detemir  20 Units Subcutaneous QHS   ipratropium-albuterol  3 mL Nebulization TID   isosorbide mononitrate  60 mg Oral Daily   And   isosorbide mononitrate  30 mg Oral QHS   midodrine  2.5 mg Oral BID   multivitamin with minerals  1 tablet Oral Daily   nicotine  21 mg Transdermal Daily   pantoprazole  40 mg Oral Daily   ranolazine  500 mg Oral BID   tamsulosin  0.4 mg Oral QPC supper   Continuous:  cefTRIAXone (ROCEPHIN)  IV Stopped (02/21/23 1244)   lactated ringers Stopped (02/22/23 1015)   vancomycin Stopped (02/21/23 1421)   ION:[id number] **OR** acetaminophen, albuterol, loperamide, morphine injection, ondansetron **OR** ondansetron (ZOFRAN) IV, oxyCODONE, zolpidem  Antibiotics: Anti-infectives (From admission, onward)    Start     Dose/Rate Route Frequency Ordered Stop   02/21/23 1400  vancomycin (VANCOCIN) IVPB 1000 mg/200 mL premix        1,000 mg 200 mL/hr over 60 Minutes Intravenous Every 24 hours 02/20/23 1342     02/20/23 1430  vancomycin (VANCOREADY) IVPB 2000 mg/400 mL        2,000 mg 200 mL/hr over 120 Minutes Intravenous  Once 02/20/23 1342 02/20/23 1739   02/16/23 1200  cefTRIAXone (ROCEPHIN) 1 g in sodium chloride 0.9 % 100 mL IVPB        1 g 200 mL/hr over 30 Minutes Intravenous Every 24 hours 02/15/23 1954 02/23/23 1159   02/15/23 1215  cefTRIAXone (ROCEPHIN) 1 g in sodium chloride 0.9 % 100 mL IVPB        1 g 200 mL/hr over 30 Minutes Intravenous  Once 02/15/23 1200 02/15/23 1238       Objective:  Vital Signs  Vitals:   02/21/23 2038 02/22/23 0318 02/22/23 0830 02/22/23 0900  BP: 137/64 139/64  (!) 153/65  Pulse: 85 74  81  Resp: 20 20  20   Temp: 99.6 F (37.6 C) 97.7 F (36.5 C)  (!) 97.5 F (36.4 C)  TempSrc: Oral  Oral  Oral  SpO2: 96% 97% 97%   Weight:      Height:        Intake/Output Summary (Last 24 hours) at 02/22/2023 1058 Last data filed at 02/22/2023 1018 Gross per 24 hour  Intake 2751.13 ml  Output 3110 ml  Net -358.87 ml   Filed Weights   02/15/23 1142 02/15/23 1900  Weight: 90.7 kg 91.9 kg    General appearance: Awake alert.  In no distress Resp: Clear to auscultation bilaterally.  Normal effort Cardio: S1-S2 is normal regular.  No S3-S4.  No rubs murmurs or bruit GI: Abdomen is soft.  Nontender nondistended.  Bowel sounds are present normal.  No masses organomegaly Extremities: Improved erythema over the dorsal aspect of the right foot.  Less warm to touch. No obvious focal neurological deficits.   Lab Results:  Data Reviewed: I have personally reviewed following labs and reports of the imaging studies  CBC: Recent Labs  Lab 02/15/23 1200 02/16/23 0431 02/17/23 0441  02/18/23 0756 02/21/23 1031 02/22/23 0441  WBC 22.2* 13.7* 9.4 7.8 10.5 10.2  NEUTROABS 19.5* 11.5*  --   --   --   --   HGB 13.3 10.5* 9.2* 9.0* 10.6* 9.0*  HCT 40.5 32.4* 29.0* 28.2* 33.7* 28.1*  MCV 81.0 82.7 84.8 83.9 84.3 83.9  PLT 197 162 159 177 240 216    Basic Metabolic Panel: Recent Labs  Lab 02/15/23 2237 02/16/23 0431 02/17/23 0441 02/18/23 0756 02/19/23 0819 02/20/23 0432 02/21/23 1031 02/22/23 0441  NA 134* 136 133* 130* 132* 134* 134* 136  K 2.5* 3.7 4.7 5.3* 5.2* 5.0 4.8 4.1  CL 108 111 110 106 104 100 98 102  CO2 20* 21* 18* 19* 23 23 28 26   GLUCOSE 203* 99 134* 124* 93 122* 158* 116*  BUN 17 16 16 15 13 16 16 19   CREATININE 1.23 1.15 1.42* 1.43* 1.26* 1.34* 1.35* 1.42*  CALCIUM 7.3* 7.7* 7.8* 8.2* 8.6* 8.8* 8.7* 8.2*  MG 1.1* 1.3*  --   --   --   --   --   --   PHOS  --   --  2.1* 2.4*  --   --   --   --     GFR: Estimated Creatinine Clearance: 56.5 mL/min (A) (by C-G formula based on SCr of 1.42 mg/dL (H)).  Liver Function Tests: Recent Labs  Lab 02/15/23 1200  02/16/23 0431 02/17/23 0441 02/18/23 0756 02/21/23 1031  AST 14* 9*  --   --  12*  ALT 13 10  --   --  14  ALKPHOS 84 61  --   --  82  BILITOT 1.5* 0.3  --   --  0.3  PROT 7.4 5.7*  --   --  7.0  ALBUMIN 3.2* 2.4* 2.2* 2.2* 2.4*    Coagulation Profile: Recent Labs  Lab 02/15/23 1200  INR 1.1    CBG: Recent Labs  Lab 02/21/23 0816 02/21/23 1140 02/21/23 1703 02/21/23 2119 02/22/23 0750  GLUCAP 130* 112* 135* 198* 120*     Recent Results (from the past 240 hour(s))  Blood Culture (routine x 2)     Status: None   Collection Time: 02/15/23 11:50 AM   Specimen: BLOOD  Result Value Ref Range Status   Specimen Description BLOOD BLOOD RIGHT WRIST  Final   Special Requests   Final    BOTTLES DRAWN AEROBIC AND ANAEROBIC Blood Culture adequate volume   Culture   Final    NO GROWTH 5 DAYS Performed at [facility], [address] [place], [place] [address]    Report Status 02/20/2023 FINAL  Final  Blood Culture (routine x 2)     Status: None   Collection Time: 02/15/23 11:55 AM   Specimen: BLOOD  Result Value Ref Range Status   Specimen Description BLOOD RIGHT ANTECUBITAL  Final   Special Requests   Final    BOTTLES DRAWN AEROBIC AND ANAEROBIC Blood Culture adequate volume   Culture   Final    NO GROWTH 5 DAYS Performed at [facility], [address], [place], [place] [address]    Report Status 02/20/2023 FINAL  Final  Culture, blood (single) w Reflex to ID Panel     Status: None (Preliminary result)   Collection Time: 02/19/23 10:52 PM   Specimen: Right Antecubital; Blood  Result Value Ref Range Status   Specimen Description RIGHT ANTECUBITAL  Final   Special Requests   Final    BOTTLES DRAWN  AEROBIC AND ANAEROBIC Blood Culture results may not be optimal due to an excessive volume of blood received in culture bottles   Culture   Final    NO GROWTH 3 DAYS Performed at [facility], [address] [place], [place] [address]    Report Status PENDING  Incomplete       Radiology Studies: MR FOOT RIGHT W WO CONTRAST  Result Date: 02/21/2023 CLINICAL DATA:  Right foot infection. EXAM: MRI OF THE RIGHT FOOT WITHOUT AND WITH CONTRAST TECHNIQUE: Multiplanar, multisequence MR imaging of the right foot was performed before and after the administration of intravenous contrast. CONTRAST:  10mL GADAVIST GADOBUTROL 1 MMOL/ML IV SOLN COMPARISON:  Right foot x-rays from yesterday. FINDINGS: Bones/Joint/Cartilage No suspicious marrow signal abnormality. No fracture or dislocation. 8 mm osteochondral lesion in the medial talar dome. Mild degenerative changes of the first MTP joint with small joint effusion. Ligaments Medial and lateral ankle ligaments are grossly intact. Lisfranc ligament is intact. Toe collateral ligaments are intact. Muscles and Tendons Intact. No tenosynovitis. Increased T2 signal within and atrophy of the intrinsic muscles of the forefoot, nonspecific, but likely related to diabetic muscle changes. Soft tissue Diffuse soft tissue swelling of the right foot, most of which enhances. There is a large area of skin and superficial soft tissue non-enhancement along the plantar aspect of the forefoot extending into the third toe (series 17, image 28). Noted skin blister along the plantar aspect of the forefoot. No subcutaneous fluid collection. No soft tissue mass. IMPRESSION: 1. Diffuse right foot cellulitis. Large area of skin and superficial soft tissue non-enhancement along the plantar aspect of the forefoot extending into the third toe, consistent with poor perfusion and possibly devitalized soft tissue. Overlying skin blister noted. Correlate with physical exam. 2. No abscess or osteomyelitis. 3. 8 mm osteochondral lesion in the medial talar dome. Electronically Signed   By: Obie Dredge M.D.   On: 02/21/2023 12:22   DG Foot 2 Views Right  Result Date: 02/20/2023 CLINICAL DATA:  Soft tissue infection. EXAM: RIGHT FOOT - 2 VIEW COMPARISON:  None Available.  FINDINGS: There is mild diffuse soft tissue swelling. Small plantar and tiny posterior calcaneal heel spurs. The cortices are intact. Moderate hallux valgus. No cortical erosion is seen.  No acute fracture or dislocation. IMPRESSION: Mild diffuse soft tissue swelling. No cortical erosion is seen to indicate radiographic evidence of acute osteomyelitis. Electronically Signed   By: Neita Garnet M.D.   On: 02/20/2023 16:20       LOS: 7 days    Rito Ehrlich  [facility] Pager on [email or web address]  02/22/2023, 10:58 AM

## 2023-02-22 NOTE — Progress Notes (Signed)
Physical Therapy Treatment Patient Details Name: [name] MRN: [id number] DOB: 06-30-1956 Today's Date: 02/22/2023   History of Present Illness [name] is a 67 y.o. 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  Patient reports he had diarrhea at least 5 times last 5 hours.  He actually has an incontinent episode during my exam.  He denies any hematochezia or melena.  Patient reports he has not felt feverish although he did have a fever when he came into the ER.  He has had chest pain.  He describes his pain in the center of his chest and radiating left.  It feels like a heaviness.  It feels similar to when he had a MI in the past.  His troponin in the ER is actually downtrending from 20-17.  Patient reports he has not felt any increase in shortness of breath.  He is on 6 L nasal cannula at baseline.  He has not had any dizziness, malaise, fatigue.  He denies any cough.  Patient does report he has had urinary retention.  He has not had any urine output for the entire day.  He reports prior to that he was not having any dysuria.  Patient reports that yesterday his neighbor noticed that his right lower extremity was swollen.  Patient reports that it had felt like needles and cold.  He attributed it to his neuropathy.  It got worse throughout the night.  The bottom of the foot started hurting badly.  Patient reports that this was not normal for him.  He did not consider that it may be infected.  Patient reports that he takes Lyrica at home and has not had a "neuropathy flare" in quite some time.  His Lyrica was not helping last night.  In regards to patient's diarrhea, patient reports not  having been on any antibiotics recently.  He has had no abdominal pain.  Nobody else around him is having diarrhea.  Patient has no other complaints at this time.    PT Comments  Patient supine in bed resting upon therapist entry and agreeable to participating in therapy today after a little encouragement. Patient required min assist for trunk with supine to sit. Patient perform hip flexion exercise very slowly sitting at edge of bed and stated he was too tired to do more exercises. Patient encouraged to come from sit to stand with RW and min/mod assist and take short shuffling side steps towards the head of bed with cues for step sequencing and hand placement using RW. LUE use limited due to history of left shoulder hemiarthroplasty 01/13/2023. Patient appeared lethargic throughout session but reported blurry vision with short distance ambulation. Nursing entered room with patient seated at edge of bed at end of session. Patient would continue to benefit from skilled physical therapy in current environment and next venue to continue return to prior function and increase strength, endurance, balance, coordination, and functional mobility and gait skills.      If plan is discharge home, recommend the following: A lot of help with walking and/or transfers;A lot of help with bathing/dressing/bathroom;Assistance with cooking/housework;Assist for transportation   Can travel by private vehicle     Yes  Equipment Recommendations  Recommendations for Other Services       Precautions / Restrictions Precautions Precautions: Fall Restrictions Weight Bearing Restrictions: No LUE Weight Bearing: Weight bearing as tolerated     Mobility  Bed Mobility Overal bed mobility: Needs Assistance Bed Mobility: Supine to Sit     Supine to sit: Min assist, HOB elevated     General bed mobility comments: Assist for trunk elevation    Transfers Overall transfer level: Needs assistance Equipment used:  Rolling walker (2 wheels) Transfers: Sit to/from Stand Sit to Stand: Mod assist, Min assist   Step pivot transfers: Min assist   Anterior-Posterior transfers: Min guard   General transfer comment: Assist to boost from EOB    Ambulation/Gait Ambulation/Gait assistance: Min assist, Min guard Gait Distance (Feet): 4 Feet Assistive device: Rolling walker (2 wheels) Gait Pattern/deviations: Step-to pattern, Decreased step length - left, Decreased step length - right, Decreased stride length, Wide base of support Gait velocity: decreased     General Gait Details: slow, labored cadence with RW limited to short shuffling side steps to the head of the bed due to pt's dizziness and complaints of fatigue.   Stairs             Wheelchair Mobility     Tilt Bed    Modified Rankin (Stroke Patients Only)       Balance Overall balance assessment: Needs assistance Sitting-balance support: Feet supported, Bilateral upper extremity supported Sitting balance-Leahy Scale: Fair Sitting balance - Comments: seated at EOB   Standing balance support: Bilateral upper extremity supported, During functional activity, Reliant on assistive device for balance Standing balance-Leahy Scale: Poor Standing balance comment: poor to fair ; posterior sway      Cognition Arousal/Alertness: Awake/alert Behavior During Therapy: WFL for tasks assessed/performed Overall Cognitive Status: Within Functional Limits for tasks assessed          Exercises General Exercises - Lower Extremity Hip Flexion/Marching: AROM, Strengthening, Both, 10 reps    General Comments        Pertinent Vitals/Pain Pain Assessment Pain Assessment: PAINAD Breathing: normal Negative Vocalization: none Facial Expression: smiling or inexpressive Body Language: relaxed Consolability: no need to console PAINAD Score: 0 Pain Intervention(s): Limited activity within patient's tolerance, Monitored during session,  Repositioned    Home Living            Prior Function            PT Goals (current goals can now be found in the care plan section) Acute Rehab PT Goals Patient Stated Goal: Return home with family to assist PT Goal Formulation: With patient Time For Goal Achievement: 03/04/23 Potential to Achieve Goals: Fair Progress towards PT goals: Progressing toward goals    Frequency    Min 3X/week      PT Plan Current plan remains appropriate       AM-PAC PT "6 Clicks" Mobility   Outcome Measure  Help needed turning from your back to your side while in a flat bed without using bedrails?: A Little Help needed moving from lying on your back to sitting on the side of a flat bed without using bedrails?: A Little Help needed moving to and from a bed to a chair (including a wheelchair)?: A Little Help needed standing up from a chair using your arms (e.g., wheelchair or bedside chair)?: A Lot Help needed to walk in hospital room?: A Lot Help needed climbing 3-5 steps with a railing? : Total 6 Click Score: 14  End of Session Equipment Utilized During Treatment: Gait belt Activity Tolerance: Patient limited by fatigue Patient left: with call bell/phone within reach;in bed;with nursing/sitter in room Nurse Communication: Mobility status PT Visit Diagnosis: Unsteadiness on feet (R26.81);Other abnormalities of gait and mobility (R26.89);Muscle weakness (generalized) (M62.81)     Time: 4098-1191 PT Time Calculation (min) (ACUTE ONLY): 22 min  Charges:    $Therapeutic Activity: 8-22 mins PT General Charges $$ ACUTE PT VISIT: 1 Visit                     Katina Dung. Hartnett-Rands, MS, PT [facility] [place] [phone] Britta Mccreedy  Hartnett-Rands 02/22/2023, 3:14 PM

## 2023-02-22 NOTE — Consult Note (Signed)
Consultation Note Date: 02/22/2023   Patient Name: [name]  DOB: 01/20/1956  MRN: [id number]  Age / Sex: 67 y.o., male  PCP: [name], [name], FNP Referring Physician: Osvaldo Shipper, MD  Reason for Consultation: Establishing goals of care  HPI/Patient Profile: 67 y.o. male  with past medical history of stroke, COPD, CAD, HTN, HLD, HFpEF, diabetes, vascular disease with amputations of L foot toes, depression, anxiety, h/o cocaine use, current nicotine use admitted on 02/15/2023 with sepsis with cellulitis RLE with ongoing fevers and pain. Followed by outpatient [facility] palliative team - reviewed notes. Reviewed MOST form.   Clinical Assessment and Goals of Care: Consult received and chart review completed. I met today with [name] and no family present. He is sleepy and lying in bed. I introduce myself from palliative care and he knows palliative care as they have been following at home. Although he has not seen or heard from palliative care since February 2024. He reports that they were following with his mother too but it has been a while since she has had a visit too. He tells me that his mother is having physical decline. He tries to care for her but he has been declining as well. He shares that he has an aide 3 hours a day but she does not help him - encouraged him to request someone else that may be able to provide more assistance to him. His brother who lives directly behind he and his mother's home is helping care for their mother while he is in the hospital. He has another brother that lives in [place].   I further discussed goals of care and [name] wishes on where to go from here. [name] tells me that his goal is to be able to continue to care for his mother. However, at the same time he tells me that he is tired and does not have much in him. He tells me "I'm ready to go home - home to  heaven." He has had hospice support previous sometime 2015-2017. He tells me that he is interested in having hospice services at home again. He would like to continue to treat infection and then pursue rehab at this time. After rehab he will need to be reconnected with [facility] palliative care to consider hospice services vs palliative services to follow at home.   All questions/concerns addressed. Emotional support provided.   Primary Decision Maker PATIENT    SUMMARY OF RECOMMENDATIONS   - DNR in place - MOST form in place - Continue conservative treatment with hopes of SNF rehab - Hospice at home vs palliative support after rehab   Code Status/Advance Care Planning: DNR   Symptom Management:  Per attending  Prognosis:  < 6 months very likely  Discharge Planning: Skilled Nursing Facility for rehab with Palliative care service follow-up      Primary Diagnoses: Present on Admission:  Sepsis due to cellulitis ([name])  Hyponatremia  Hypokalemia  Smoking  Mixed hyperlipidemia  GERD without esophagitis  Essential  hypertension  Anxiety state   I have reviewed the medical record, interviewed the patient and family, and examined the patient. The following aspects are pertinent.  Past Medical History:  Diagnosis Date   Anxiety    Arthritis    Cholecystitis, acute 12/20/2013   Lap chole 6/5   Cocaine abuse ([name])    Community acquired pneumonia 05/31/2018   COPD (chronic obstructive pulmonary disease) ([name])    Coronary atherosclerosis of native coronary artery    a. 03/09/2013 Cath/PCI: LM nl, LAD: 50p, 58m (2.5x16 promus DES), LCX nl, OM1 min irregs, LPL/LPDA diff dzs, RCA nondom, mod diff dzs, EF 55%.   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 ([name]) 04/24/2021   History of nephrolithiasis    History of pneumonia    History of stroke     Right MCA distribution, residual left-sided weakness   Hyperlipidemia    Metabolic encephalopathy    Noncompliance    Osteomyelitis of second toe of left foot ([name]) 04/24/2021   Renal cell carcinoma ([name])    Status post radical right nephrectomy August 2015   Respiratory failure with hypoxia ([name])    Sepsis ([name]) 05/19/2019   Sleep apnea    On CPAP, 4L O2 no cpap at home yet   Type 2 diabetes mellitus ([name]) 2011   Social History   Socioeconomic History   Marital status: Single    Spouse name: Not on file   Number of children: Not on file   Years of education: Not on file   Highest education level: Not on file  Occupational History   Occupation: N/A  Tobacco Use   Smoking status: Every Day    Current packs/day: 1.00    Average packs/day: 1 pack/day for 50.8 years (50.8 ttl pk-yrs)    Types: Cigarettes    Start date: 05/03/1972    Passive exposure: Never   Smokeless tobacco: Never   Tobacco comments:    1/2 ppd 09/25/2022 hfb  Vaping Use   Vaping status: Never Used  Substance and Sexual Activity   Alcohol use: No    Alcohol/week: 0.0 standard drinks of alcohol   Drug use: No    Types: Cocaine    Comment: last used cocaine 11/2013. but is cocaine + this admit July 2015   Sexual activity: Never  Other Topics Concern   Not on file  Social History Narrative   Lives in [place] with his mother.  He does not work.   Social Determinants of Health   Financial Resource Strain: Low Risk  (05/19/2019)   Overall Financial Resource Strain (CARDIA)    Difficulty of Paying Living Expenses: Not hard at all  Food Insecurity: No Food Insecurity (02/15/2023)   Hunger Vital Sign    Worried About Running Out of Food in the Last Year: Never true    Ran Out of Food in the Last Year: Never true  Transportation Needs: No Transportation Needs (02/15/2023)   PRAPARE - Administrator, Civil Service (Medical): No    Lack of Transportation (Non-Medical): No  Physical Activity:  Inactive (05/19/2019)   Exercise Vital Sign    Days of Exercise per Week: 0 days    Minutes of Exercise per Session: 0 min  Stress: No Stress Concern Present (05/19/2019)   [employer] of Occupational Health - Occupational  Stress Questionnaire    Feeling of Stress : Not at all  Social Connections: Unknown (11/28/2021)   [facility], [facility]   Social Network    Social Network: Not on file   Family History  Problem Relation Age of Onset   Cancer Mother        Thyroid - living in her 21's.   Hypertension Other    Diabetes Other    Stroke Other    Lung cancer Father    CAD Father    Cancer Maternal Grandmother        Breast   Cancer Maternal Grandfather        Throat and stomach   CAD Brother    Scheduled Meds:  ALPRAZolam  1 mg Oral TID   atorvastatin  20 mg Oral QHS   clopidogrel  75 mg Oral Daily   dextromethorphan-guaiFENesin  1 tablet Oral BID   feeding supplement (GLUCERNA SHAKE)  237 mL Oral TID BM   FLUoxetine  40 mg Oral Daily   heparin  5,000 Units Subcutaneous Q8H   insulin aspart  0-15 Units Subcutaneous TID WC   insulin aspart  0-5 Units Subcutaneous QHS   insulin detemir  20 Units Subcutaneous QHS   ipratropium-albuterol  3 mL Nebulization TID   isosorbide mononitrate  60 mg Oral Daily   And   isosorbide mononitrate  30 mg Oral QHS   midodrine  2.5 mg Oral BID   multivitamin with minerals  1 tablet Oral Daily   nicotine  21 mg Transdermal Daily   pantoprazole  40 mg Oral Daily   ranolazine  500 mg Oral BID   tamsulosin  0.4 mg Oral QPC supper   Continuous Infusions:  cefTRIAXone (ROCEPHIN)  IV Stopped (02/21/23 1244)   lactated ringers Stopped (02/22/23 1015)   vancomycin Stopped (02/21/23 1421)   PRN Meds:.acetaminophen **OR** acetaminophen, albuterol, loperamide, morphine injection, ondansetron **OR** ondansetron (ZOFRAN) IV, oxyCODONE, zolpidem Allergies  Allergen Reactions   Lisinopril Other (See Comments)    Other  reaction(s): worsening kidney function   Pollen Extract     Other reaction(s): sneezing   Oxycodone Itching and Nausea Only    Pt is able to tolerate if taken with benadryl   Review of Systems  Constitutional:  Positive for activity change and fatigue.  Neurological:  Positive for weakness.    Physical Exam Vitals and nursing note reviewed.  Constitutional:      General: He is sleeping. He is not in acute distress.    Appearance: He is ill-appearing.     Comments: Weak, fatigued  Cardiovascular:     Rate and Rhythm: Normal rate.  Pulmonary:     Effort: No tachypnea, accessory muscle usage or respiratory distress.     Comments: On oxygen Abdominal:     Palpations: Abdomen is soft.  Feet:     Comments: Right foot red/warm Neurological:     Mental Status: He is oriented to person, place, and time and easily aroused.     Vital Signs: BP (!) 153/65   Pulse 81   Temp (!) 97.5 F (36.4 C) (Oral)   Resp 20   Ht 5\' 9"  (1.753 m)   Wt 91.9 kg   SpO2 97%   BMI 29.92 kg/m  Pain Scale: 0-10 POSS *See Group Information*: 1-Acceptable,Awake and alert Pain Score: 0-No pain   SpO2: SpO2: 97 % O2 Device:SpO2: 97 % O2 Flow Rate: .O2 Flow Rate (L/min): 4 L/min  IO: Intake/output  summary:  Intake/Output Summary (Last 24 hours) at 02/22/2023 1148 Last data filed at 02/22/2023 1018 Gross per 24 hour  Intake 2751.13 ml  Output 3110 ml  Net -358.87 ml    LBM: Last BM Date : 02/20/23 Baseline Weight: Weight: 90.7 kg Most recent weight: Weight: 91.9 kg     Palliative Assessment/Data:     Time Total: 75 min  Greater than 50%  of this time was spent counseling and coordinating care related to the above assessment and plan.  Signed by: Yong Channel, NP Palliative Medicine Team Pager # [phone] (M-F 8a-5p) Team Phone # [phone] (Nights/Weekends)

## 2023-02-22 NOTE — Progress Notes (Signed)
Telemetry called and stated patient had 5 beat run nonsustained vtach. Patient resting, no complaints and VSS. Secure chat sent to Dr. Carren Rang to make aware.

## 2023-02-23 DIAGNOSIS — L03115 Cellulitis of right lower limb: Secondary | ICD-10-CM | POA: Diagnosis not present

## 2023-02-23 DIAGNOSIS — D649 Anemia, unspecified: Secondary | ICD-10-CM | POA: Diagnosis not present

## 2023-02-23 LAB — GLUCOSE, CAPILLARY
Glucose-Capillary: 98 mg/dL (ref 70–99)
Glucose-Capillary: 134 mg/dL — ABNORMAL HIGH (ref 70–99)
Glucose-Capillary: 147 mg/dL — ABNORMAL HIGH (ref 70–99)
Glucose-Capillary: 133 mg/dL — ABNORMAL HIGH (ref 70–99)

## 2023-02-23 MED ORDER — ALPRAZOLAM 0.5 MG PO TABS
0.5000 mg | ORAL_TABLET | Freq: Three times a day (TID) | ORAL | Status: DC
  Administered 2023-02-23 – 2023-02-24 (×3): 0.5 mg via ORAL
  Filled 2023-02-23 (×3): qty 1

## 2023-02-23 NOTE — Progress Notes (Signed)
Nutrition Follow-up  DOCUMENTATION CODES:   Non-severe (moderate) malnutrition in context of chronic illness  INTERVENTION:   -Continue carb modified diet -Continue MVI with minerals daily -Continue Glucerna Shake po TID, each supplement provides 220 kcal and 10 grams of protein   NUTRITION DIAGNOSIS:   Moderate Malnutrition related to chronic illness (COPD, T2DM, CAD) as evidenced by percent weight loss, moderate muscle depletion.  Ongoing  GOAL:   Patient will meet greater than or equal to 90% of their needs  Progressing   MONITOR:   PO intake, Supplement acceptance, Labs, Diet advancement, Weight trends  REASON FOR ASSESSMENT:   Malnutrition Screening Tool    ASSESSMENT:   Pt admitted with chest pain. Pt being worked up for presumed sepsis d/t cellulitis of RLE. PMH significant for anxiety, cocaine use, COPD, CAD, depression, HTN, GERD, T2DM, amputations of L toes, tobacco use.  Reviewed I/O's: +70 ml x 24 hours and -518 ml since admission  UOP: 1 L x 24 hours   Pt lying in bed, asleep at time of visit. He died not respond to name being called. No family present.   Pt with good oral intake. Noted meal completions 90-100%. Pt is also drinking Glucerna supplements.   No new wt since admission.   Palliative care following; plan to treat the treatable.   Per TOC notes, expect discharge within 1-2 days. Plan SNF placement; pt has insurance authorization.   Labs reviewed: CBGS: 98-154 (inpatient orders for glycemic control are 0-15 units insulin aspart TID with meals, 0-5 units insulin aspart daily at bedtime, 20 units insulin detemir daily at bedtime).    Diet Order:   Diet Order             Diet Carb Modified Fluid consistency: Thin; Room service appropriate? Yes  Diet effective now                   EDUCATION NEEDS:   Education needs have been addressed  Skin:  Skin Assessment: Skin Integrity Issues: Skin Integrity Issues:: Stage I Stage I:  sacrum  Last BM:  02/21/23  Height:   Ht Readings from Last 1 Encounters:  02/15/23 5\' 9"  (1.753 m)    Weight:   Wt Readings from Last 1 Encounters:  02/15/23 91.9 kg   BMI:  Body mass index is 29.92 kg/m.  Estimated Nutritional Needs:   Kcal:  2000-2200  Protein:  100-115g  Fluid:  >/=2L    Levada Schilling, RD, LDN, CDCES Registered Dietitian II Certified Diabetes Care and Education Specialist Please refer to [facility] for RD and/or RD on-call/weekend/after hours pager

## 2023-02-23 NOTE — TOC Progression Note (Signed)
Transition of Care [facility]) - Progression Note    Patient Details  Name: [name] MRN: [id number] Date of Birth: 08-Nov-1955  Transition of Care [facility]) CM/SW Contact  Elliot Gault, LCSW Phone Number: 02/23/2023, 10:18 AM  Clinical Narrative:     TOC following. MD anticipating dc in 1-2 days. Tomorrow is the last day pt can dc to SNF on his current insurance authorization. If pt does not dc tomorrow, TOC will initiate new auth request.  Updated Debbie at [facility]. She states that they can accept pt tomorrow if he is stable for dc.   Assigned TOC will follow up in AM.  Expected Discharge Plan: Skilled Nursing Facility Barriers to Discharge: Continued Medical Work up  Expected Discharge Plan and Services In-house Referral: Clinical Social Work   Post Acute Care Choice: Skilled Nursing Facility Living arrangements for the past 2 months: Single Family Home                                       Social Determinants of Health (SDOH) Interventions SDOH Screenings   Food Insecurity: No Food Insecurity (02/15/2023)  Housing: Patient Declined (02/15/2023)  Transportation Needs: No Transportation Needs (02/15/2023)  Utilities: Not At Risk (02/15/2023)  Depression (PHQ2-9): Low Risk  (04/24/2022)  Financial Resource Strain: Low Risk  (05/19/2019)  Physical Activity: Inactive (05/19/2019)  Social Connections: Unknown (11/28/2021)   Received from [facility], [facility]  Stress: No Stress Concern Present (05/19/2019)  Tobacco Use: High Risk (02/15/2023)    Readmission Risk Interventions    02/16/2023    8:31 AM 01/19/2023    1:19 PM 01/13/2023   10:44 AM  Readmission Risk Prevention Plan  Transportation Screening Complete  Complete  HRI or Home Care Consult   Complete  Social Work Consult for Recovery Care Planning/Counseling   Complete  Palliative Care Screening   Not Applicable  Medication Review Oceanographer) Complete  Complete  PCP or Specialist  appointment within 3-5 days of discharge  Complete   HRI or Home Care Consult Complete Complete   SW Recovery Care/Counseling Consult Complete Complete   Palliative Care Screening Not Applicable Not Applicable   Skilled Nursing Facility Not Applicable Not Applicable

## 2023-02-23 NOTE — Plan of Care (Signed)
  Problem: Clinical Measurements: Goal: Ability to avoid or minimize complications of infection will improve Outcome: Progressing   Problem: Skin Integrity: Goal: Skin integrity will improve Outcome: Progressing   Problem: Education: Goal: Knowledge of General Education information will improve Description: Including pain rating scale, medication(s)/side effects and non-pharmacologic comfort measures Outcome: Progressing   Problem: Health Behavior/Discharge Planning: Goal: Ability to manage health-related needs will improve Outcome: Progressing   Problem: Clinical Measurements: Goal: Ability to maintain clinical measurements within normal limits will improve Outcome: Progressing Goal: Will remain free from infection Outcome: Progressing Goal: Diagnostic test results will improve Outcome: Progressing Goal: Respiratory complications will improve Outcome: Progressing Goal: Cardiovascular complication will be avoided Outcome: Progressing   Problem: Activity: Goal: Risk for activity intolerance will decrease Outcome: Progressing   Problem: Nutrition: Goal: Adequate nutrition will be maintained Outcome: Progressing   Problem: Coping: Goal: Level of anxiety will decrease Outcome: Progressing   Problem: Elimination: Goal: Will not experience complications related to bowel motility Outcome: Progressing Goal: Will not experience complications related to urinary retention Outcome: Progressing   Problem: Pain Managment: Goal: General experience of comfort will improve Outcome: Progressing   Problem: Safety: Goal: Ability to remain free from injury will improve Outcome: Progressing   Problem: Skin Integrity: Goal: Risk for impaired skin integrity will decrease Outcome: Progressing   Problem: Education: Goal: Ability to describe self-care measures that may prevent or decrease complications (Diabetes Survival Skills Education) will improve Outcome: Progressing Goal:  Individualized Educational Video(s) Outcome: Progressing   Problem: Coping: Goal: Ability to adjust to condition or change in health will improve Outcome: Progressing   Problem: Fluid Volume: Goal: Ability to maintain a balanced intake and output will improve Outcome: Progressing   Problem: Health Behavior/Discharge Planning: Goal: Ability to identify and utilize available resources and services will improve Outcome: Progressing Goal: Ability to manage health-related needs will improve Outcome: Progressing   Problem: Metabolic: Goal: Ability to maintain appropriate glucose levels will improve Outcome: Progressing   Problem: Nutritional: Goal: Maintenance of adequate nutrition will improve Outcome: Progressing Goal: Progress toward achieving an optimal weight will improve Outcome: Progressing   Problem: Skin Integrity: Goal: Risk for impaired skin integrity will decrease Outcome: Progressing   Problem: Tissue Perfusion: Goal: Adequacy of tissue perfusion will improve Outcome: Progressing   

## 2023-02-23 NOTE — Progress Notes (Signed)
Mobility Specialist Progress Note:    02/23/23 1215  Mobility  Activity Ambulated with assistance in room  Level of Assistance Minimal assist, patient does 75% or more  Assistive Device Front wheel walker  Distance Ambulated (ft) 10 ft  Range of Motion/Exercises Active;All extremities  LUE Weight Bearing WBAT  Activity Response Tolerated well  Mobility Referral Yes  $Mobility charge 1 Mobility  Mobility Specialist Start Time (ACUTE ONLY) 1215  Mobility Specialist Stop Time (ACUTE ONLY) 1225  Mobility Specialist Time Calculation (min) (ACUTE ONLY) 10 min   Pt was received in BR, assisted ambulation from [facility]. Pt reported dizziness and blurry vision, proceeded with orthostatics. Stood at chairside, BP 154/68 (91). Sat in chair after ambulating, BP 153/67 (90). Dizziness and blurry vision improved once in chair. Left pt in chair, all needs met.   Lawerance Bach Mobility Specialist Please contact via Special educational needs teacher or  Rehab office at [phone]

## 2023-02-23 NOTE — Plan of Care (Signed)
  Problem: Clinical Measurements: Goal: Ability to avoid or minimize complications of infection will improve Outcome: Progressing   Problem: Skin Integrity: Goal: Skin integrity will improve Outcome: Progressing   Problem: Education: Goal: Knowledge of General Education information will improve Description: Including pain rating scale, medication(s)/side effects and non-pharmacologic comfort measures Outcome: Progressing   Problem: Health Behavior/Discharge Planning: Goal: Ability to manage health-related needs will improve Outcome: Progressing   

## 2023-02-23 NOTE — Progress Notes (Addendum)
[facility] PROGRESS NOTE   [name] OZH:[id number] DOB: 1956/04/21 DOA: 02/15/2023  PCP: Genia Hotter, FNP  Brief History/Interval Summary: 67 y.o. 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 The patient's pain has not improved, recurrently febrile, so vancomycin is added. Blood culture repeated.   Consultants: None yet  Procedures: None    Subjective/Interval History: Patient mentions that the pain in the right foot is getting better.  No new complaints offered today.   Assessment/Plan:  Right foot cellulitis/sepsis, present on admission Patient was started on ceftriaxone.  Continue to have febrile episodes so vancomycin was added on 8/3. Blood cultures from 7/29 negative.  Blood cultures were repeated and negative so far. MRI of the right foot did not show any deep abscess.  Findings did suggest cellulitis.  No vascular compromise noted on physical examination. Foot actually has been improving the last 48 hours.  WBC remains normal.  Procalcitonin 0.13. Plan is to continue ceftriaxone and vancomycin for another 24 hours and then hopefully transition to oral antibiotics tomorrow as long as he remains afebrile.  Will also repeat labs for tomorrow morning.  PAD Underwent ABI measurements during this hospital stay.  Right ABI 0.69 (moderate), Left ABI normal.  Continue atorvastatin, ranexa and plavix. Patient with prior transmetatarsal amputation of the left foot No obvious vascular compromise noted on examination.  His cellulitis appears to be improving.  He can follow-up with vascular surgery in the outpatient setting.  If however his  symptoms recur then will have low threshold to do a CT angiogram of right lower extremity.   Essential hypertension:  His diltiazem was held.  Continuing just nitrates for now.   Orthostatics were checked yesterday and he did have drop in his systolic blood pressure of 20 mm from lying to standing position.  He does have a known history of orthostatic hypotension and is noted to be on midodrine.  Continue to monitor. Continue to hold diltiazem for now.   IDT2DM with diabetic peripheral neuropathy: HbA1c 9.3%.  - Continue reduced basal-bolus regimen here.  - Continue lyrica  COPD/emphysema/tobacco abuse: - Continue bronchodilators - Continue nicotine patch Stable for the most part.   Hypokalemia/hypomagnesemia/hyponatremia Monitor metabolic panel periodically.  Sodium and potassium level noted to be normal today.   AKI:  Unable to truly ascertain his baseline as his creatinine has been fluctuating between 1.0-2.0. He is likely at his baseline now.   Aortic mural thrombus:  As seen on CTA chest. Studies do not show a change in embolization risk with chronic anticoagulation and higher risk of destabilizing clot with thrombolysis. We are continuing plavix.  Normocytic anemia Likely has anemia of chronic disease.  Some drop in hemoglobin is noted during this hospital stay.  Hemoglobin stable.   Chronic hypoxic respiratory failure, ILD On 6L O2, says he's been on this since 2015.  - At baseline.    OSA:  - CPAP qHS   Anxiety:  - Continue alprazolam and prozac.  Noted be on scheduled alprazolam.  Has been somnolent so we will cut back on the dose today.   GERD:  - PPI   Moderate protein calorie malnutrition:  - Supp protein   s/p left shoulder hemiplasty/reduction of chronic dislocation 6/26  by Dr. Everardo Pacific:  - Continue pain control, PT   Debility: Pt has marked weakness diffusely - PT/OT consulted > SNF rehabilitation being pursued.   DVT Prophylaxis: Subcutaneous  heparin Code Status: DNR Family Communication: Discussed with patient Disposition Plan: Anticipate discharge to SNF in 48 hours.     Medications: Scheduled:  ALPRAZolam  1 mg Oral TID   atorvastatin  20 mg Oral QHS   clopidogrel  75 mg Oral Daily   dextromethorphan-guaiFENesin  1 tablet Oral BID   feeding supplement (GLUCERNA SHAKE)  237 mL Oral TID BM   FLUoxetine  40 mg Oral Daily   heparin  5,000 Units Subcutaneous Q8H   insulin aspart  0-15 Units Subcutaneous TID WC   insulin aspart  0-5 Units Subcutaneous QHS   insulin detemir  20 Units Subcutaneous QHS   ipratropium-albuterol  3 mL Nebulization TID   isosorbide mononitrate  60 mg Oral Daily   And   isosorbide mononitrate  30 mg Oral QHS   midodrine  2.5 mg Oral BID   multivitamin with minerals  1 tablet Oral Daily   nicotine  21 mg Transdermal Daily   pantoprazole  40 mg Oral Daily   ranolazine  500 mg Oral BID   tamsulosin  0.4 mg Oral QPC supper   Continuous:  lactated ringers Stopped (02/22/23 1015)   vancomycin 1,000 mg (02/22/23 1503)   YTK:ZSWFUXNATFTDD **OR** acetaminophen, albuterol, loperamide, morphine injection, ondansetron **OR** ondansetron (ZOFRAN) IV, oxyCODONE, zolpidem  Antibiotics: Anti-infectives (From admission, onward)    Start     Dose/Rate Route Frequency Ordered Stop   02/21/23 1400  vancomycin (VANCOCIN) IVPB 1000 mg/200 mL premix        1,000 mg 200 mL/hr over 60 Minutes Intravenous Every 24 hours 02/20/23 1342     02/20/23 1430  vancomycin (VANCOREADY) IVPB 2000 mg/400 mL        2,000 mg 200 mL/hr over 120 Minutes Intravenous  Once 02/20/23 1342 02/20/23 1739   02/16/23 1200  cefTRIAXone (ROCEPHIN) 1 g in sodium chloride 0.9 % 100 mL IVPB        1 g 200 mL/hr over 30 Minutes Intravenous Every 24 hours 02/15/23 1954 02/22/23 1325   02/15/23 1215  cefTRIAXone (ROCEPHIN) 1 g in sodium chloride 0.9 % 100 mL IVPB        1 g 200 mL/hr over 30 Minutes Intravenous  Once 02/15/23 1200  02/15/23 1238       Objective:  Vital Signs  Vitals:   02/22/23 1334 02/22/23 2034 02/22/23 2123 02/23/23 0542  BP: (!) 121/48  (!) 156/68 123/67  Pulse: 77  87 73  Resp: 16  15 20   Temp: 97.8 F (36.6 C)  100 F (37.8 C) 98.5 F (36.9 C)  TempSrc:    Oral  SpO2: 93% 91% 93% 95%  Weight:      Height:        Intake/Output Summary (Last 24 hours) at 02/23/2023 0945 Last data filed at 02/22/2023 2001 Gross per 24 hour  Intake 840 ml  Output 750 ml  Net 90 ml   Filed Weights   02/15/23 1142 02/15/23 1900  Weight: 90.7 kg 91.9 kg    General appearance: Awake alert.  In no distress Resp: Clear to auscultation bilaterally.  Normal effort Cardio: S1-S2 is normal regular.  No S3-S4.  No rubs murmurs or bruit GI: Abdomen is soft.  Nontender nondistended.  Bowel sounds are present normal.  No masses organomegaly Extremities: Erythema over  the dorsal aspect of the right foot continues to improve.  Plantar aspect was also examined.  No area of ischemia noted.   Lab Results:  Data Reviewed: I have personally reviewed following labs and reports of the imaging studies  CBC: Recent Labs  Lab 02/17/23 0441 02/18/23 0756 02/21/23 1031 02/22/23 0441  WBC 9.4 7.8 10.5 10.2  HGB 9.2* 9.0* 10.6* 9.0*  HCT 29.0* 28.2* 33.7* 28.1*  MCV 84.8 83.9 84.3 83.9  PLT 159 177 240 216    Basic Metabolic Panel: Recent Labs  Lab 02/17/23 0441 02/18/23 0756 02/19/23 0819 02/20/23 0432 02/21/23 1031 02/22/23 0441  NA 133* 130* 132* 134* 134* 136  K 4.7 5.3* 5.2* 5.0 4.8 4.1  CL 110 106 104 100 98 102  CO2 18* 19* 23 23 28 26   GLUCOSE 134* 124* 93 122* 158* 116*  BUN 16 15 13 16 16 19   CREATININE 1.42* 1.43* 1.26* 1.34* 1.35* 1.42*  CALCIUM 7.8* 8.2* 8.6* 8.8* 8.7* 8.2*  PHOS 2.1* 2.4*  --   --   --   --     GFR: Estimated Creatinine Clearance: 56.5 mL/min (A) (by C-G formula based on SCr of 1.42 mg/dL (H)).  Liver Function Tests: Recent Labs  Lab 02/17/23 0441  02/18/23 0756 02/21/23 1031  AST  --   --  12*  ALT  --   --  14  ALKPHOS  --   --  82  BILITOT  --   --  0.3  PROT  --   --  7.0  ALBUMIN 2.2* 2.2* 2.4*    CBG: Recent Labs  Lab 02/22/23 0750 02/22/23 1120 02/22/23 1613 02/22/23 2130 02/23/23 0735  GLUCAP 120* 199* 143* 154* 98     Recent Results (from the past 240 hour(s))  Blood Culture (routine x 2)     Status: None   Collection Time: 02/15/23 11:50 AM   Specimen: BLOOD  Result Value Ref Range Status   Specimen Description BLOOD BLOOD RIGHT WRIST  Final   Special Requests   Final    BOTTLES DRAWN AEROBIC AND ANAEROBIC Blood Culture adequate volume   Culture   Final    NO GROWTH 5 DAYS Performed at [facility], [address], [place], [place] [address]    Report Status 02/20/2023 FINAL  Final  Blood Culture (routine x 2)     Status: None   Collection Time: 02/15/23 11:55 AM   Specimen: BLOOD  Result Value Ref Range Status   Specimen Description BLOOD RIGHT ANTECUBITAL  Final   Special Requests   Final    BOTTLES DRAWN AEROBIC AND ANAEROBIC Blood Culture adequate volume   Culture   Final    NO GROWTH 5 DAYS Performed at [facility], [address], [place], [place] [address]    Report Status 02/20/2023 FINAL  Final  Culture, blood (single) w Reflex to ID Panel     Status: None (Preliminary result)   Collection Time: 02/19/23 10:52 PM   Specimen: Right Antecubital; Blood  Result Value Ref Range Status   Specimen Description RIGHT ANTECUBITAL  Final   Special Requests   Final    BOTTLES DRAWN AEROBIC AND ANAEROBIC Blood Culture results may not be optimal due to an excessive volume of blood received in culture bottles   Culture   Final    NO GROWTH 3 DAYS Performed at [facility], [address] [place], [place] [address]    Report Status PENDING  Incomplete  Radiology Studies: MR FOOT RIGHT W WO CONTRAST  Result Date: 02/21/2023 CLINICAL DATA:  Right foot infection. EXAM: MRI OF THE RIGHT FOOT  WITHOUT AND WITH CONTRAST TECHNIQUE: Multiplanar, multisequence MR imaging of the right foot was performed before and after the administration of intravenous contrast. CONTRAST:  10mL GADAVIST GADOBUTROL 1 MMOL/ML IV SOLN COMPARISON:  Right foot x-rays from yesterday. FINDINGS: Bones/Joint/Cartilage No suspicious marrow signal abnormality. No fracture or dislocation. 8 mm osteochondral lesion in the medial talar dome. Mild degenerative changes of the first MTP joint with small joint effusion. Ligaments Medial and lateral ankle ligaments are grossly intact. Lisfranc ligament is intact. Toe collateral ligaments are intact. Muscles and Tendons Intact. No tenosynovitis. Increased T2 signal within and atrophy of the intrinsic muscles of the forefoot, nonspecific, but likely related to diabetic muscle changes. Soft tissue Diffuse soft tissue swelling of the right foot, most of which enhances. There is a large area of skin and superficial soft tissue non-enhancement along the plantar aspect of the forefoot extending into the third toe (series 17, image 28). Noted skin blister along the plantar aspect of the forefoot. No subcutaneous fluid collection. No soft tissue mass. IMPRESSION: 1. Diffuse right foot cellulitis. Large area of skin and superficial soft tissue non-enhancement along the plantar aspect of the forefoot extending into the third toe, consistent with poor perfusion and possibly devitalized soft tissue. Overlying skin blister noted. Correlate with physical exam. 2. No abscess or osteomyelitis. 3. 8 mm osteochondral lesion in the medial talar dome. Electronically Signed   By: Obie Dredge M.D.   On: 02/21/2023 12:22       LOS: 8 days    Rito Ehrlich  [facility] Pager on [email or web address]  02/23/2023, 9:45 AM

## 2023-02-24 ENCOUNTER — Inpatient Hospital Stay (HOSPITAL_COMMUNITY): Payer: 59

## 2023-02-24 DIAGNOSIS — A419 Sepsis, unspecified organism: Secondary | ICD-10-CM | POA: Diagnosis not present

## 2023-02-24 DIAGNOSIS — Z515 Encounter for palliative care: Secondary | ICD-10-CM | POA: Diagnosis not present

## 2023-02-24 DIAGNOSIS — L03115 Cellulitis of right lower limb: Secondary | ICD-10-CM | POA: Diagnosis not present

## 2023-02-24 DIAGNOSIS — Z7189 Other specified counseling: Secondary | ICD-10-CM | POA: Diagnosis not present

## 2023-02-24 DIAGNOSIS — L0390 Cellulitis, unspecified: Secondary | ICD-10-CM | POA: Diagnosis not present

## 2023-02-24 LAB — GLUCOSE, CAPILLARY
Glucose-Capillary: 106 mg/dL — ABNORMAL HIGH (ref 70–99)
Glucose-Capillary: 125 mg/dL — ABNORMAL HIGH (ref 70–99)
Glucose-Capillary: 128 mg/dL — ABNORMAL HIGH (ref 70–99)
Glucose-Capillary: 128 mg/dL — ABNORMAL HIGH (ref 70–99)

## 2023-02-24 LAB — CULTURE, BLOOD (SINGLE): Culture: NO GROWTH

## 2023-02-24 MED ORDER — HYDRALAZINE HCL 20 MG/ML IJ SOLN: 10.0000 mg | INTRAMUSCULAR | Status: DC | PRN

## 2023-02-24 MED ORDER — INSULIN DETEMIR 100 UNIT/ML ~~LOC~~ SOLN
10.0000 [IU] | Freq: Every day | SUBCUTANEOUS | Status: DC
  Administered 2023-02-24 – 2023-02-25 (×2): 10 [IU] via SUBCUTANEOUS
  Filled 2023-02-24 (×3): qty 0.1

## 2023-02-24 MED ORDER — HYDROMORPHONE HCL 1 MG/ML IJ SOLN
0.5000 mg | INTRAMUSCULAR | Status: DC | PRN
  Administered 2023-02-25 – 2023-03-04 (×6): 0.5 mg via INTRAVENOUS
  Filled 2023-02-24 (×8): qty 0.5

## 2023-02-24 MED ORDER — ALPRAZOLAM 0.5 MG PO TABS
0.5000 mg | ORAL_TABLET | Freq: Three times a day (TID) | ORAL | Status: DC | PRN
  Administered 2023-02-25 – 2023-03-03 (×5): 0.5 mg via ORAL
  Filled 2023-02-24 (×6): qty 1

## 2023-02-24 MED ORDER — VANCOMYCIN VARIABLE DOSE PER UNSTABLE RENAL FUNCTION (PHARMACIST DOSING): Status: DC

## 2023-02-24 MED ORDER — DOXYCYCLINE HYCLATE 100 MG PO TABS
100.0000 mg | ORAL_TABLET | Freq: Two times a day (BID) | ORAL | Status: DC
  Administered 2023-02-24 – 2023-03-02 (×13): 100 mg via ORAL
  Filled 2023-02-24 (×13): qty 1

## 2023-02-24 MED ORDER — INSULIN ASPART 100 UNIT/ML IJ SOLN: 0.0000 [IU] | Freq: Every day | INTRAMUSCULAR | Status: DC

## 2023-02-24 MED ORDER — SODIUM CHLORIDE 0.9 % IV SOLN: INTRAVENOUS | Status: DC

## 2023-02-24 MED ORDER — INSULIN ASPART 100 UNIT/ML IJ SOLN
0.0000 [IU] | Freq: Three times a day (TID) | INTRAMUSCULAR | Status: DC
  Administered 2023-02-24: 2 [IU] via SUBCUTANEOUS
  Administered 2023-02-25: 1 [IU] via SUBCUTANEOUS

## 2023-02-24 NOTE — Progress Notes (Signed)
Mobility Specialist Progress Note:    02/24/23 1035  Mobility  Activity Transferred from bed to chair  Level of Assistance Moderate assist, patient does 50-74%  Assistive Device Front wheel walker  Distance Ambulated (ft) 4 ft  Range of Motion/Exercises Active;All extremities  LUE Weight Bearing WBAT  Activity Response Tolerated well  Mobility Referral Yes  $Mobility charge 1 Mobility  Mobility Specialist Start Time (ACUTE ONLY) 1045  Mobility Specialist Stop Time (ACUTE ONLY) 1050  Mobility Specialist Time Calculation (min) (ACUTE ONLY) 5 min   Pt was received in bed, agreeable to mobility session. Required ModA to stand and MinA to transfer to chair. Tolerated well, experienced dizziness and SOB while ambulating. Left pt in chair, alarm on. All needs met.   Lawerance Bach Mobility Specialist Please contact via Special educational needs teacher or  Rehab office at [phone]

## 2023-02-24 NOTE — Progress Notes (Signed)
Patient has not voided my shift,  I asked if he had to use the bathroom, patient said "no". Patient was bladder scanned,and it showed . Dr Sherryll Burger notified. Plan of care on going.

## 2023-02-24 NOTE — Progress Notes (Signed)
Lab just called and stated patients potassium,CRT, and BUN have all significantly increased since yesterday, Dr. Carren Rang made aware, will pass this information to day shift.

## 2023-02-24 NOTE — TOC Progression Note (Signed)
Transition of Care [facility]) - Progression Note    Patient Details  Name: [name] MRN: [id number] Date of Birth: 1955/08/04  Transition of Care [facility]) CM/SW Contact  Karn [place], [place] Phone Number: 02/24/2023, 10:14 AM  Clinical Narrative: LCSW updated Debbie at [facility] on pt. Anticipate d/c 2-3 days. Will restart authorization when appropriate.       Expected Discharge Plan: Skilled Nursing Facility Barriers to Discharge: Continued Medical Work up  Expected Discharge Plan and Services In-house Referral: Clinical Social Work   Post Acute Care Choice: Skilled Nursing Facility Living arrangements for the past 2 months: Single Family Home                                       Social Determinants of Health (SDOH) Interventions SDOH Screenings   Food Insecurity: No Food Insecurity (02/15/2023)  Housing: Patient Declined (02/15/2023)  Transportation Needs: No Transportation Needs (02/15/2023)  Utilities: Not At Risk (02/15/2023)  Depression (PHQ2-9): Low Risk  (04/24/2022)  Financial Resource Strain: Low Risk  (05/19/2019)  Physical Activity: Inactive (05/19/2019)  Social Connections: Unknown (11/28/2021)   Received from [facility]  Stress: No Stress Concern Present (05/19/2019)  Tobacco Use: High Risk (02/15/2023)    Readmission Risk Interventions    02/16/2023    8:31 AM 01/19/2023    1:19 PM 01/13/2023   10:44 AM  Readmission Risk Prevention Plan  Transportation Screening Complete  Complete  HRI or Home Care Consult   Complete  Social Work Consult for Recovery Care Planning/Counseling   Complete  Palliative Care Screening   Not Applicable  Medication Review [employer]) Complete  Complete  PCP or Specialist appointment within 3-5 days of discharge  Complete   HRI or Home Care Consult Complete Complete   SW Recovery Care/Counseling Consult Complete Complete   Palliative Care Screening Not Applicable Not Applicable    Skilled Nursing Facility Not Applicable Not Applicable

## 2023-02-24 NOTE — Progress Notes (Signed)
PROGRESS NOTE    [name]  ZOX:[id number] DOB: Mar 16, 1956 DOA: 02/15/2023 PCP: Genia Hotter, FNP   Brief Narrative:    67 y.o. 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 The patient's pain has not improved, recurrently febrile, so vancomycin is added. Blood culture repeated with no growth to date.  He is now noted to have worsening AKI.  Assessment & Plan:   Principal Problem:   Sepsis due to cellulitis [facility]) Active Problems:   Hypokalemia   Type 2 diabetes mellitus ([name])   Hyponatremia   Essential hypertension   Anxiety state   Mixed hyperlipidemia   Smoking   GERD without esophagitis   Peripheral neuropathy   Cellulitis in diabetic foot ([name])   Aortic mural thrombus ([name])   Malnutrition of moderate degree  Assessment and Plan:  Right foot cellulitis/sepsis, present on admission Patient was started on ceftriaxone.  Continue to have febrile episodes so vancomycin was added on 8/3. Blood cultures from 7/29 negative.  Blood cultures were repeated and negative so far. MRI of the right foot did not show any deep abscess.  Findings did suggest cellulitis.  No vascular compromise noted on physical examination. Foot actually has been improving the last 48 hours.  WBC remains normal.  Procalcitonin 0.13. Plan to switch vancomycin to doxycycline to avoid further insult to kidneys and Rocephin discontinued   PAD Underwent ABI measurements during this hospital stay.  Right ABI 0.69 (moderate), Left ABI normal.  Continue atorvastatin, ranexa and plavix. Patient with prior transmetatarsal amputation of the left foot No obvious vascular compromise noted  on examination.  His cellulitis appears to be improving.  He can follow-up with vascular surgery in the outpatient setting.  If however his symptoms recur then will have low threshold to do a CT angiogram of right lower extremity.   Essential hypertension:  His diltiazem was held.  Continuing just nitrates for now.   Orthostatics were checked yesterday and he did have drop in his systolic blood pressure of 20 mm from lying to standing position.  He does have a known history of orthostatic hypotension and is noted to be on midodrine.  Continue to monitor. Continue to hold diltiazem for now.   IDT2DM with diabetic peripheral neuropathy: HbA1c 9.3%.  - Continue reduced basal-bolus regimen here.  - Continue lyrica   COPD/emphysema/tobacco abuse: - Continue bronchodilators - Continue nicotine patch Stable for the most part.   AKI-worsening Unable to truly ascertain his baseline as his creatinine has been fluctuating between 1.0-2.0. Renal ultrasound with previous right nephrectomy and left side with no obstruction Document strict I's and O's   Aortic mural thrombus:  As seen on CTA chest. Studies do not show a change in embolization risk with chronic anticoagulation and higher risk of destabilizing clot with thrombolysis. We are continuing plavix.   Normocytic anemia Likely has anemia of chronic disease.  Some drop in hemoglobin is noted during this hospital stay.  Hemoglobin stable.   Chronic hypoxic respiratory failure, ILD On 6L O2, says he's been on this since 2015.  - At baseline.    OSA:  - CPAP qHS   Anxiety:  - Continue alprazolam and prozac.  Noted be on scheduled alprazolam.  Has been somnolent  so we will cut back on the dose today.   GERD:  - PPI   Moderate protein calorie malnutrition:  - Supp protein   s/p left shoulder hemiplasty/reduction of chronic dislocation 6/26 by Dr. Everardo Pacific:  - Continue pain control, PT   Debility: Pt has marked weakness diffusely -  PT/OT consulted > SNF rehabilitation being pursued.     DVT prophylaxis:Heparin Code Status: DNR Family Communication: None at bedside Disposition Plan: SNF Status is: Inpatient Remains inpatient appropriate because: Need for IV fluid and medications.   Skin Assessment:  I have examined the patient's skin and I agree with the wound assessment as performed by the wound care RN as outlined below:  Pressure Injury 02/22/23 Sacrum Medial Stage 1 -  Intact skin with non-blanchable redness of a localized area usually over a bony prominence. (Active)  02/22/23 1019  Location: Sacrum  Location Orientation: Medial  Staging: Stage 1 -  Intact skin with non-blanchable redness of a localized area usually over a bony prominence.  Wound Description (Comments):   Present on Admission:   Dressing Type Foam - Lift dressing to assess site every shift 02/23/23 1948    Consultants:  None  Procedures:  None  Antimicrobials:  Anti-infectives (From admission, onward)    Start     Dose/Rate Route Frequency Ordered Stop   02/24/23 1200  doxycycline (VIBRA-TABS) tablet 100 mg        100 mg Oral Every 12 hours 02/24/23 1009     02/24/23 0817  vancomycin variable dose per unstable renal function (pharmacist dosing)  Status:  Discontinued         Does not apply See admin instructions 02/24/23 0817 02/24/23 1009   02/21/23 1400  vancomycin (VANCOCIN) IVPB 1000 mg/200 mL premix  Status:  Discontinued        1,000 mg 200 mL/hr over 60 Minutes Intravenous Every 24 hours 02/20/23 1342 02/24/23 0817   02/20/23 1430  vancomycin (VANCOREADY) IVPB 2000 mg/400 mL        2,000 mg 200 mL/hr over 120 Minutes Intravenous  Once 02/20/23 1342 02/20/23 1739   02/16/23 1200  cefTRIAXone (ROCEPHIN) 1 g in sodium chloride 0.9 % 100 mL IVPB        1 g 200 mL/hr over 30 Minutes Intravenous Every 24 hours 02/15/23 1954 02/22/23 1325   02/15/23 1215  cefTRIAXone (ROCEPHIN) 1 g in sodium chloride 0.9 % 100 mL IVPB         1 g 200 mL/hr over 30 Minutes Intravenous  Once 02/15/23 1200 02/15/23 1238      Subjective: Patient seen and evaluated today and states that he overall does not feel well and has some ongoing right foot pain.  Objective: Vitals:   02/24/23 0355 02/24/23 0420 02/24/23 0819 02/24/23 0925  BP: (!) 143/64   122/75  Pulse: 81   89  Resp: 17     Temp: 97.8 F (36.6 C)   98.6 F (37 C)  TempSrc:    Oral  SpO2: 90% 93% 93% 95%  Weight:      Height:        Intake/Output Summary (Last 24 hours) at 02/24/2023 1219 Last data filed at 02/24/2023 0900 Gross per 24 hour  Intake 1382.66 ml  Output 5 ml  Net 1377.66 ml   Filed Weights   02/15/23 1142 02/15/23 1900  Weight: 90.7 kg 91.9 kg    Examination:  General exam: Appears calm and comfortable  Respiratory system: Clear to auscultation.  Respiratory effort normal.  5 L nasal cannula at baseline. Cardiovascular system: S1 & S2 heard, RRR.  Gastrointestinal system: Abdomen is soft Central nervous system: Alert and awake Extremities: Right foot erythema Skin: No significant lesions noted Psychiatry: Flat affect.    Data Reviewed: I have personally reviewed following labs and imaging studies  CBC: Recent Labs  Lab 02/18/23 0756 02/21/23 1031 02/22/23 0441 02/24/23 0510  WBC 7.8 10.5 10.2 13.4*  HGB 9.0* 10.6* 9.0* 9.3*  HCT 28.2* 33.7* 28.1* 29.3*  MCV 83.9 84.3 83.9 83.5  PLT 177 240 216 245   Basic Metabolic Panel: Recent Labs  Lab 02/18/23 0756 02/19/23 0819 02/20/23 0432 02/21/23 1031 02/22/23 0441 02/24/23 0510  NA 130* 132* 134* 134* 136 130*  K 5.3* 5.2* 5.0 4.8 4.1 5.0  CL 106 104 100 98 102 97*  CO2 19* 23 23 28 26 24   GLUCOSE 124* 93 122* 158* 116* 114*  BUN 15 13 16 16 19  33*  CREATININE 1.43* 1.26* 1.34* 1.35* 1.42* 2.51*  CALCIUM 8.2* 8.6* 8.8* 8.7* 8.2* 8.0*  PHOS 2.4*  --   --   --   --   --    GFR: Estimated Creatinine Clearance: 32 mL/min (A) (by C-G formula based on SCr of 2.51 mg/dL  (H)). Liver Function Tests: Recent Labs  Lab 02/18/23 0756 02/21/23 1031  AST  --  12*  ALT  --  14  ALKPHOS  --  82  BILITOT  --  0.3  PROT  --  7.0  ALBUMIN 2.2* 2.4*   No results for input(s): "LIPASE", "AMYLASE" in the last 168 hours. No results for input(s): "AMMONIA" in the last 168 hours. Coagulation Profile: No results for input(s): "INR", "PROTIME" in the last 168 hours. Cardiac Enzymes: No results for input(s): "CKTOTAL", "CKMB", "CKMBINDEX", "TROPONINI" in the last 168 hours. BNP (last 3 results) Recent Labs    09/25/22 1412  PROBNP 54.0   HbA1C: No results for input(s): "HGBA1C" in the last 72 hours. CBG: Recent Labs  Lab 02/23/23 1107 02/23/23 1601 02/23/23 2018 02/24/23 0753 02/24/23 1105  GLUCAP 134* 147* 133* 106* 125*   Lipid Profile: No results for input(s): "CHOL", "HDL", "LDLCALC", "TRIG", "CHOLHDL", "LDLDIRECT" in the last 72 hours. Thyroid Function Tests: No results for input(s): "TSH", "T4TOTAL", "FREET4", "T3FREE", "THYROIDAB" in the last 72 hours. Anemia Panel: No results for input(s): "VITAMINB12", "FOLATE", "FERRITIN", "TIBC", "IRON", "RETICCTPCT" in the last 72 hours. Sepsis Labs: Recent Labs  Lab 02/19/23 2252 02/22/23 0441  PROCALCITON  --  0.13  LATICACIDVEN 1.4  --     Recent Results (from the past 240 hour(s))  Blood Culture (routine x 2)     Status: None   Collection Time: 02/15/23 11:50 AM   Specimen: BLOOD  Result Value Ref Range Status   Specimen Description BLOOD BLOOD RIGHT WRIST  Final   Special Requests   Final    BOTTLES DRAWN AEROBIC AND ANAEROBIC Blood Culture adequate volume   Culture   Final    NO GROWTH 5 DAYS Performed at [facility], [address], [place], [place] [address]    Report Status 02/20/2023 FINAL  Final  Blood Culture (routine x 2)     Status: None   Collection Time: 02/15/23 11:55 AM   Specimen: BLOOD  Result Value Ref Range Status   Specimen Description BLOOD RIGHT ANTECUBITAL  Final    Special Requests   Final    BOTTLES DRAWN AEROBIC AND ANAEROBIC Blood Culture adequate  volume   Culture   Final    NO GROWTH 5 DAYS Performed at [facility], [address], [place], [place] [address]    Report Status 02/20/2023 FINAL  Final  Culture, blood (single) w Reflex to ID Panel     Status: None   Collection Time: 02/19/23 10:52 PM   Specimen: Right Antecubital; Blood  Result Value Ref Range Status   Specimen Description RIGHT ANTECUBITAL  Final   Special Requests   Final    BOTTLES DRAWN AEROBIC AND ANAEROBIC Blood Culture results may not be optimal due to an excessive volume of blood received in culture bottles   Culture   Final    NO GROWTH 5 DAYS Performed at [facility], [address] [place], [place] [address]    Report Status 02/24/2023 FINAL  Final         Radiology Studies: US RENAL  Result Date: 02/24/2023 CLINICAL DATA:  Acute kidney insufficiency EXAM: RENAL / URINARY TRACT ULTRASOUND COMPLETE COMPARISON:  CT 02/15/2023 FINDINGS: Right Kidney: Previous right nephrectomy. Left Kidney: Renal measurements: 13.3 x 8.2 x 7.2 cm = volume: 410.1 mL. No collecting system dilatation or perinephric fluid. Exophytic midportion simple appearing cyst measuring 16 mm which is anechoic with through transmission and smooth margins. Bladder: Appears normal for degree of bladder distention. Other: None. IMPRESSION: Previous right nephrectomy. No left-sided collecting system dilatation. Electronically Signed   By: [name] M.D.   On: 02/24/2023 10:18        Scheduled Meds:  ALPRAZolam  0.5 mg Oral TID   atorvastatin  20 mg Oral QHS   clopidogrel  75 mg Oral Daily   dextromethorphan-guaiFENesin  1 tablet Oral BID   doxycycline  100 mg Oral Q12H   feeding supplement (GLUCERNA SHAKE)  237 mL Oral TID BM   FLUoxetine  40 mg Oral Daily   heparin  5,000 Units Subcutaneous Q8H   insulin aspart  0-15 Units Subcutaneous TID WC   insulin aspart  0-5 Units Subcutaneous QHS    insulin detemir  20 Units Subcutaneous QHS   ipratropium-albuterol  3 mL Nebulization TID   isosorbide mononitrate  60 mg Oral Daily   And   isosorbide mononitrate  30 mg Oral QHS   midodrine  2.5 mg Oral BID   multivitamin with minerals  1 tablet Oral Daily   nicotine  21 mg Transdermal Daily   pantoprazole  40 mg Oral Daily   ranolazine  500 mg Oral BID   tamsulosin  0.4 mg Oral QPC supper   Continuous Infusions:  lactated ringers 75 mL/hr at 02/24/23 0325     LOS: 9 days    Time spent: 35 minutes     Hoover Brunette, DO [facility]  If 7PM-7AM, please contact night-coverage [email or web address] 02/24/2023, 12:19 PM

## 2023-02-24 NOTE — Progress Notes (Addendum)
Palliative:  HPI: 67 y.o. male  with past medical history of stroke, COPD, CAD, HTN, HLD, HFpEF, diabetes, vascular disease with amputations of L foot toes, depression, anxiety, h/o cocaine use, current nicotine use admitted on 02/15/2023 with sepsis with cellulitis RLE with ongoing fevers and pain. Followed by outpatient [facility] palliative team - reviewed notes. Reviewed MOST form.   I met today with [name]. He is sitting up in recliner. He continues to be lethargic and slow to respond. He has no energy and very poor reserve. We discussed his progress and goals of care. His kidneys are worsened today. [name] is aware. He would never want dialysis or invasive/aggressive care - he would opt for comfort and hospice. He would be inclined for comfort care and hospice now if not for his mother. He would like to be able to get well enough to return home to be with his mother who also has declining health. He confirms goal for continued treatment with goal to return home with his mother. He would welcome hospice support at home. Hopeful for improvement enough for SNF rehab stay.   All questions/concerns addressed. Emotional support provided. Discussed with Dr. Sherryll Burger.   Exam: Alert but sleepy. Poor reserve. Breathing regular, unlabored at rest with oxygen. No distress. Abd soft. No urine output today. Generalized weakness and fatigue.   Plan: - DNR - Hopeful for SNF rehab and return home to mother - Open to hospice at home when indicated (he has no caregiver at home) - No dialysis - hospice and comfort if further decline  40 min  Yong Channel, NP Palliative Medicine Team Pager [phone] (Please see [email or web address] for schedule) Team Phone [phone]    Greater than 50%  of this time was spent counseling and coordinating care related to the above assessment and plan

## 2023-02-25 DIAGNOSIS — Z7189 Other specified counseling: Secondary | ICD-10-CM | POA: Diagnosis not present

## 2023-02-25 DIAGNOSIS — Z515 Encounter for palliative care: Secondary | ICD-10-CM | POA: Diagnosis not present

## 2023-02-25 DIAGNOSIS — A419 Sepsis, unspecified organism: Secondary | ICD-10-CM | POA: Diagnosis not present

## 2023-02-25 DIAGNOSIS — N179 Acute kidney failure, unspecified: Secondary | ICD-10-CM | POA: Diagnosis not present

## 2023-02-25 DIAGNOSIS — L0390 Cellulitis, unspecified: Secondary | ICD-10-CM | POA: Diagnosis not present

## 2023-02-25 LAB — IRON AND TIBC
Iron: 22 ug/dL — ABNORMAL LOW (ref 45–182)
Saturation Ratios: 13 % — ABNORMAL LOW (ref 17.9–39.5)
TIBC: 172 ug/dL — ABNORMAL LOW (ref 250–450)
UIBC: 150 ug/dL

## 2023-02-25 LAB — GLUCOSE, CAPILLARY
Glucose-Capillary: 112 mg/dL — ABNORMAL HIGH (ref 70–99)
Glucose-Capillary: 117 mg/dL — ABNORMAL HIGH (ref 70–99)
Glucose-Capillary: 141 mg/dL — ABNORMAL HIGH (ref 70–99)
Glucose-Capillary: 110 mg/dL — ABNORMAL HIGH (ref 70–99)

## 2023-02-25 LAB — POTASSIUM: Potassium: 5.6 mmol/L — ABNORMAL HIGH (ref 3.5–5.1)

## 2023-02-25 LAB — VANCOMYCIN, RANDOM: Vancomycin Rm: 14 ug/mL

## 2023-02-25 LAB — FERRITIN: Ferritin: 261 ng/mL (ref 24–336)

## 2023-02-25 MED ORDER — IPRATROPIUM-ALBUTEROL 0.5-2.5 (3) MG/3ML IN SOLN
3.0000 mL | Freq: Two times a day (BID) | RESPIRATORY_TRACT | Status: DC
  Administered 2023-02-26 – 2023-03-04 (×13): 3 mL via RESPIRATORY_TRACT
  Filled 2023-02-25 (×13): qty 3

## 2023-02-25 MED ORDER — DEXTROSE 50 % IV SOLN
1.0000 | Freq: Once | INTRAVENOUS | Status: AC
  Administered 2023-02-25: 50 mL via INTRAVENOUS
  Filled 2023-02-25: qty 50

## 2023-02-25 MED ORDER — SODIUM ZIRCONIUM CYCLOSILICATE 10 G PO PACK
10.0000 g | PACK | Freq: Once | ORAL | Status: AC
  Administered 2023-02-25: 10 g via ORAL
  Filled 2023-02-25: qty 1

## 2023-02-25 MED ORDER — MAGNESIUM SULFATE IN D5W 1-5 GM/100ML-% IV SOLN
1.0000 g | Freq: Once | INTRAVENOUS | Status: AC
  Administered 2023-02-25: 1 g via INTRAVENOUS
  Filled 2023-02-25 (×2): qty 100

## 2023-02-25 MED ORDER — INSULIN ASPART 100 UNIT/ML IV SOLN
10.0000 [IU] | Freq: Once | INTRAVENOUS | Status: AC
  Administered 2023-02-25: 10 [IU] via INTRAVENOUS

## 2023-02-25 NOTE — Progress Notes (Addendum)
Patient is not voiding bladder was scanned per Dr Vallery Sa orders, 147 mls of urine showed on bladder scan. Patient tolerated procedure. Plan of care on going.

## 2023-02-25 NOTE — Consult Note (Signed)
[facility] Renal Consultation Note  Requesting MD: Maurilio Lovely, DO Indication for Consultation: AKI   Chief complaint: chest pain and diarrhea  HPI:  [name] is a 67 y.o. male with a history of cocaine abuse, COPD, coronary artery disease, hypertension, type 2 DM, nephrolithiasis, prior stroke, radical right nephrectomy for renal cell, and obstructive sleep apnea who presented to the hospital with chest pain and diarrhea.  He was found to have sepsis which was felt secondary to cellulitis.  He was febrile and tachycardic on admission and had a lactic acidosis.  He was started on Rocephin.  He was found to have acute kidney injury and was given IV fluids as well.  Note that he has an aortic mural thrombus seen on CTA chest previously.  He is on Plavix and cardiology has recommended continuing the same.  Nephrology is consulted for assistance with management of AKI.  Palliative care has seen him outpatient and here in the hospital as well.  He has expressed to them that he would never want dialysis or aggressive care and would welcome hospice support at home as needed.  Strict ins and outs are not available.  He appears to have had 3 urine voids charted over 8/7 and at least 1-2 liters per day in addition to unmeasured urine voids.  He has most recently been on NS at 100 ml/hr.  He shares that his mother is my clinic patient.  He states to me that he would never want dialysis "there is too much wrong with me".  He had a renal US with evidence of previous right nephrectomy and no collecting system dilatation.  He states that they have scanned to check for retention as well.    Creatinine, Ser  Date/Time Value Ref Range Status  02/25/2023 04:42 AM 3.27 (H) 0.61 - 1.24 mg/dL Final  16/04/9603 54:09 AM 2.51 (H) 0.61 - 1.24 mg/dL Final    Comment:    DELTA CHECK NOTED  02/22/2023 04:41 AM 1.42 (H) 0.61 - 1.24 mg/dL Final  81/19/1478 29:56 AM 1.35 (H) 0.61 - 1.24 mg/dL Final   21/30/8657 84:69 AM 1.34 (H) 0.61 - 1.24 mg/dL Final  62/95/2841 32:44 AM 1.26 (H) 0.61 - 1.24 mg/dL Final  07/22/7251 66:44 AM 1.43 (H) 0.61 - 1.24 mg/dL Final  03/47/4259 56:38 AM 1.42 (H) 0.61 - 1.24 mg/dL Final  75/64/3329 51:88 AM 1.15 0.61 - 1.24 mg/dL Final  41/66/0630 16:01 PM 1.23 0.61 - 1.24 mg/dL Final  09/32/3557 32:20 PM 1.39 (H) 0.61 - 1.24 mg/dL Final  25/42/7062 37:62 AM 1.31 (H) 0.61 - 1.24 mg/dL Final  83/15/1761 60:73 PM 1.66 (H) 0.61 - 1.24 mg/dL Final  71/12/2692 85:46 AM 1.36 (H) 0.61 - 1.24 mg/dL Final  27/09/5007 38:18 AM 1.21 0.61 - 1.24 mg/dL Final  29/93/7169 67:89 AM 1.77 (H) 0.61 - 1.24 mg/dL Final  38/04/1750 02:58 AM 2.25 (H) 0.61 - 1.24 mg/dL Final  52/77/8242 35:36 AM 2.10 (H) 0.61 - 1.24 mg/dL Final    Comment:    DELTA CHECK NOTED  01/13/2023 04:45 PM 1.08 0.61 - 1.24 mg/dL Final  14/43/1540 08:67 AM 1.18 0.61 - 1.24 mg/dL Final  61/95/0932 67:12 PM 1.23 0.61 - 1.24 mg/dL Final  45/80/9983 38:25 PM 1.57 (H) 0.61 - 1.24 mg/dL Final  05/39/7673 41:93 AM 2.08 (H) 0.61 - 1.24 mg/dL Final    Comment:    DELTA CHECK NOTED  12/23/2022 05:02 AM 1.06 0.61 - 1.24 mg/dL Final  79/08/4095 35:32 PM 1.24  0.61 - 1.24 mg/dL Final  57/84/6962 95:28 PM 2.05 (H) 0.40 - 1.50 mg/dL Final  41/32/4401 02:72 PM 1.28 (H) 0.61 - 1.24 mg/dL Final  53/66/4403 47:42 AM 1.73 (H) 0.61 - 1.24 mg/dL Final  59/56/3875 64:33 AM 1.62 (H) 0.61 - 1.24 mg/dL Final  29/51/8841 66:06 PM 1.82 (H) 0.61 - 1.24 mg/dL Final  30/16/0109 32:35 AM 1.50 (H) 0.61 - 1.24 mg/dL Final  57/32/2025 42:70 AM 1.80 (H) 0.61 - 1.24 mg/dL Final  62/37/6283 15:17 PM 1.27 (H) 0.61 - 1.24 mg/dL Final  61/60/7371 06:26 AM 1.89 (H) 0.40 - 1.50 mg/dL Final  94/85/4627 03:50 AM 1.12 0.61 - 1.24 mg/dL Final  09/38/1829 93:71 AM 1.40 (H) 0.61 - 1.24 mg/dL Final  69/67/8938 10:17 AM 1.36 (H) 0.61 - 1.24 mg/dL Final  51/08/5850 77:82 AM 1.57 (H) 0.61 - 1.24 mg/dL Final  42/35/3614 43:15 PM 1.81 (H) 0.61 - 1.24  mg/dL Final  40/02/6760 95:09 PM 1.92 (H) 0.61 - 1.24 mg/dL Final  32/67/1245 80:99 PM 1.84 (H) 0.61 - 1.24 mg/dL Final  83/38/2505 39:76 PM 1.74 (H) 0.61 - 1.24 mg/dL Final  73/41/9379 02:40 AM 1.32 (H) 0.61 - 1.24 mg/dL Final  97/35/3299 24:26 PM 1.65 (H) 0.61 - 1.24 mg/dL Final  83/41/9622 29:79 PM 1.80 (H) 0.61 - 1.24 mg/dL Final  89/21/1941 74:08 AM 1.14 0.61 - 1.24 mg/dL Final  14/48/1856 31:49 AM 1.40 (H) 0.61 - 1.24 mg/dL Final  70/26/3785 88:50 PM 1.91 (H) 0.61 - 1.24 mg/dL Final  27/74/1287 86:76 AM 1.27 (H) 0.61 - 1.24 mg/dL Final  72/03/4708 62:83 AM 1.42 (H) 0.61 - 1.24 mg/dL Final  66/29/4765 46:50 AM 1.60 (H) 0.61 - 1.24 mg/dL Final  35/46/5681 27:51 PM 1.85 (H) 0.61 - 1.24 mg/dL Final     PMHx:   Past Medical History:  Diagnosis Date   Anxiety    Arthritis    Cholecystitis, acute 12/20/2013   Lap chole 6/5   Cocaine abuse ([name])    Community acquired pneumonia 05/31/2018   COPD (chronic obstructive pulmonary disease) ([name])    Coronary atherosclerosis of native coronary artery    a. 03/09/2013 Cath/PCI: LM nl, LAD: 50p, 45m (2.5x16 promus DES), LCX nl, OM1 min irregs, LPL/LPDA diff dzs, RCA nondom, mod diff dzs, EF 55%.   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 ([name]) 04/24/2021   History of nephrolithiasis    History of pneumonia    History of stroke    Right MCA distribution, residual left-sided weakness   Hyperlipidemia    Metabolic encephalopathy    Noncompliance    Osteomyelitis of second toe of left foot ([name]) 04/24/2021   Renal cell carcinoma ([name])    Status post radical right nephrectomy August 2015   Respiratory failure with hypoxia ([name])    Sepsis ([name]) 05/19/2019   Sleep apnea    On CPAP, 4L O2 no cpap at home yet   Type 2 diabetes mellitus ([name]) 2011    Past Surgical History:  Procedure Laterality Date    ABDOMINAL AORTOGRAM W/LOWER EXTREMITY Left 07/17/2021   Procedure: ABDOMINAL AORTOGRAM W/LOWER EXTREMITY;  Surgeon: Nada Libman, MD;  Location: [facility];  Service: Cardiovascular;  Laterality: Left;   AMPUTATION Left 12/27/2020   Procedure: PARTIAL FIRST RAY AMPUTATION OF FOOT;  Surgeon: Candelaria Stagers, DPM;  Location[facility]  OR;  Service: Podiatry;  Laterality: Left;   APPENDECTOMY  1970's   CHOLECYSTECTOMY N/A 12/22/2013   Procedure: LAPAROSCOPIC CHOLECYSTECTOMY ;  Surgeon: Shelly Rubenstein, MD;  Location: [facility] OR;  Service: General;  Laterality: N/A;   ENDARTERECTOMY Right 10/08/2014   Procedure: Right ENDARTERECTOMY CAROTID;  Surgeon: Larina Earthly, MD;  Location: [facility] OR;  Service: Vascular;  Laterality: Right;   LAPAROSCOPIC LYSIS OF ADHESIONS  02/21/2014   Procedure: LAPAROSCOPIC LYSIS OF ADHESIONS EXTINSIVE;  Surgeon: Sebastian Ache, MD;  Location: [facility] ORS;  Service: Urology;;   LEFT HEART CATHETERIZATION WITH CORONARY ANGIOGRAM N/A 06/02/2012   Procedure: LEFT HEART CATHETERIZATION WITH CORONARY ANGIOGRAM;  Surgeon: Herby Abraham, MD;  Location: [facility] CATH LAB;  Service: Cardiovascular;  Laterality: N/A;   LEFT HEART CATHETERIZATION WITH CORONARY ANGIOGRAM N/A 03/09/2013   Procedure: LEFT HEART CATHETERIZATION WITH CORONARY ANGIOGRAM;  Surgeon: Tonny Bollman, MD;  Location: [facility] CATH LAB;  Service: Cardiovascular;  Laterality: N/A;   LEFT HEART CATHETERIZATION WITH CORONARY ANGIOGRAM N/A 12/20/2013   Procedure: LEFT HEART CATHETERIZATION WITH CORONARY ANGIOGRAM;  Surgeon: Kathleene Hazel, MD;  Location: [facility] CATH LAB;  Service: Cardiovascular;  Laterality: N/A;   LUNG BIOPSY Left 05/18/2014   Procedure: LUNG BIOPSY left upper lobe & left lower lobe;  Surgeon: Loreli Slot, MD;  Location: [facility] OR;  Service: Thoracic;  Laterality: Left;   PATCH ANGIOPLASTY Right 10/08/2014   Procedure: PATCH ANGIOPLASTY Right Carotid;  Surgeon: Larina Earthly, MD;  Location: [facility] OR;  Service:  Vascular;  Laterality: Right;   PERIPHERAL VASCULAR INTERVENTION Left 07/17/2021   Procedure: PERIPHERAL VASCULAR INTERVENTION;  Surgeon: Nada Libman, MD;  Location: [facility];  Service: Cardiovascular;  Laterality: Left;  Superficial Femoral Artery   REVERSE SHOULDER ARTHROPLASTY Left 01/13/2023   Procedure: OPEN REDUCTION OF CHRONIC SHOULDER DISLOCATION, OPEN REDUCTION INTERNAL FIXATION OF TUBULAR FRACTURE, LEFT SHOULDER HEMIPLASTY, LATERJET AUGMENTATION;  Surgeon: Bjorn Pippin, MD;  Location: [facility] ORS;  Service: Orthopedics;  Laterality: Left;   ROBOT ASSISTED LAPAROSCOPIC NEPHRECTOMY Right 02/21/2014   Procedure: ROBOTIC ASSISTED LAPAROSCOPIC RIGHT NEPHRECTOMY ;  Surgeon: Sebastian Ache, MD;  Location: [facility] ORS;  Service: Urology;  Laterality: Right;   VIDEO ASSISTED THORACOSCOPY Left 05/18/2014   Procedure: LEFT VIDEO ASSISTED THORACOSCOPY;  Surgeon: Loreli Slot, MD;  Location: [facility] OR;  Service: Thoracic;  Laterality: Left;   VIDEO BRONCHOSCOPY  05/18/2014   Procedure: VIDEO BRONCHOSCOPY;  Surgeon: Loreli Slot, MD;  Location: [facility] OR;  Service: Thoracic;;    Family Hx:  Family History  Problem Relation Age of Onset   Cancer Mother        Thyroid - living in her 54's.   Hypertension Other    Diabetes Other    Stroke Other    Lung cancer Father    CAD Father    Cancer Maternal Grandmother        Breast   Cancer Maternal Grandfather        Throat and stomach   CAD Brother     Social History:  reports that he has been smoking cigarettes. He started smoking about 50 years ago. He has a 50.8 pack-year smoking history. He has never been exposed to tobacco smoke. He has never used smokeless tobacco. He reports that he does not drink alcohol and does not use drugs.  Allergies:  Allergies  Allergen Reactions   Lisinopril Other (See Comments)    Other reaction(s): worsening kidney function   Pollen Extract  Other reaction(s): sneezing   Oxycodone Itching and  Nausea Only    Pt is able to tolerate if taken with benadryl    Medications: Prior to Admission medications   Medication Sig Start Date End Date Taking? Authorizing Provider  ALPRAZolam Prudy Feeler) 1 MG tablet Take 1 mg by mouth 3 (three) times daily. 01/07/21  Yes [provider]  atorvastatin (LIPITOR) 20 MG tablet Take 20 mg by mouth at bedtime. 04/26/18  Yes [provider]  benzonatate (TESSALON) 200 MG capsule Take 200 mg by mouth 3 (three) times daily as needed for cough.   Yes [provider]  clopidogrel (PLAVIX) 75 MG tablet TAKE ONE TABLET BY MOUTH EVERY DAY 05/27/22  Yes Jonelle Sidle, MD  diltiazem (CARDIZEM CD) 180 MG 24 hr capsule Take 180 mg by mouth daily. 01/20/23  Yes [provider]  diphenhydrAMINE (BENADRYL) 25 mg capsule Take 50 mg by mouth at bedtime.   Yes [provider]  esomeprazole (NEXIUM) 40 MG capsule Take 40 mg by mouth 2 (two) times daily before a meal.   Yes [provider]  FLUoxetine (PROZAC) 40 MG capsule Take 40 mg by mouth daily.   Yes [provider]  insulin regular human CONCENTRATED (HUMULIN R) 500 UNIT/ML injection Inject 90 Units into the skin 3 (three) times daily.   Yes [provider]  isosorbide mononitrate (IMDUR) 60 MG 24 hr tablet Take 1 tablet (60 mg total) by mouth in the morning. & 30 mg in the evening Patient taking differently: Take 60 mg by mouth in the morning. 03/27/22  Yes Jonelle Sidle, MD  linagliptin (TRADJENTA) 5 MG TABS tablet Take 5 mg by mouth daily.   Yes [provider]  midodrine (PROAMATINE) 2.5 MG tablet Take 1 tablet (2.5 mg total) by mouth 2 (two) times daily. 08/11/22  Yes Jonelle Sidle, MD  nitroGLYCERIN (NITROSTAT) 0.4 MG SL tablet Place 0.4 mg under the tongue every 5 (five) minutes as needed for chest pain.   Yes [provider]  ondansetron (ZOFRAN) 4 MG tablet Take 4 mg by mouth every 8 (eight) hours as needed for nausea  or vomiting. 01/19/23  Yes [provider]  oxyCODONE-acetaminophen (PERCOCET) 10-325 MG tablet Take 1 tablet by mouth every 6 (six) hours as needed.   Yes [provider]  potassium chloride SA (KLOR-CON M) 20 MEQ tablet Take 20 mEq by mouth 2 (two) times daily. 12/28/22  Yes [provider]  pregabalin (LYRICA) 25 MG capsule Take 25 mg by mouth 2 (two) times daily. 02/14/19  Yes [provider]  ranolazine (RANEXA) 500 MG 12 hr tablet Take 1 tablet (500 mg total) by mouth 2 (two) times daily. 08/27/22  Yes Sharlene Dory, NP  torsemide (DEMADEX) 20 MG tablet Take 60 mg by mouth 2 (two) times daily. 01/20/23  Yes [provider]  zolpidem (AMBIEN) 5 MG tablet Take 5 mg by mouth at bedtime as needed for sleep. 01/28/23  Yes [provider]  insulin aspart (NOVOLOG FLEXPEN) 100 UNIT/ML FlexPen 30 units 5-10 minutes before meals Subcutaneous three times a day for 30 days    [provider]    I have reviewed the patient's current and reported prior to admission medications.  Labs:     Latest Ref Rng & Units 02/25/2023    4:42 AM 02/24/2023    5:10 AM 02/22/2023    4:41 AM  BMP  Glucose 70 - 99 mg/dL 027  253  116   BUN 8 - 23 mg/dL 43  33  19   Creatinine 0.61 - 1.24 mg/dL 1.61  0.96  0.45   Sodium 135 - 145 mmol/L 130  130  136   Potassium 3.5 - 5.1 mmol/L 5.3  5.0  4.1   Chloride 98 - 111 mmol/L 99  97  102   CO2 22 - 32 mmol/L 22  24  26    Calcium 8.9 - 10.3 mg/dL 7.9  8.0  8.2     Urinalysis    Component Value Date/Time   COLORURINE YELLOW 02/16/2023 0600   APPEARANCEUR CLEAR 02/16/2023 0600   LABSPEC 1.023 02/16/2023 0600   PHURINE 6.0 02/16/2023 0600   GLUCOSEU NEGATIVE 02/16/2023 0600   HGBUR SMALL (A) 02/16/2023 0600   BILIRUBINUR NEGATIVE 02/16/2023 0600   KETONESUR NEGATIVE 02/16/2023 0600   PROTEINUR 100 (A) 02/16/2023 0600   UROBILINOGEN 0.2 02/28/2015 1310   NITRITE NEGATIVE 02/16/2023 0600   LEUKOCYTESUR NEGATIVE  02/16/2023 0600     ROS:  Pertinent items noted in HPI and remainder of comprehensive ROS otherwise negative.  Physical Exam: Vitals:   02/25/23 0807 02/25/23 0853  BP:  (!) 163/75  Pulse:  91  Resp:    Temp:  98.3 F (36.8 C)  SpO2: 93% 94%     General:  adult male in bed in NAD at rest  HEENT: NCAT Eyes: EOMI sclera anicteric Neck: supple trachea midline  Heart: S1S2 no rub Lungs: occ wheeze; unlabored at rest on 6 liters oxygen  Abdomen: soft/nt/nd obese habitus Extremities: no pitting edema; no cyanosis or clubbing Skin: wearing socks; no rash on extremities exposed Neuro: alert and oriented x 3 provides hx and follows commands; he has insight Psych normal mood and affect GU no foley  Assessment/Plan:  # AKI  -May be prerenal in the setting of sepsis as well as diuretic use.  He is on torsemide 60 mg BID at home which has been held.  He was initiated on vanc and this is now off.  No troughs available - Strict ins and outs - can continue fluids as tolerated from a respiratory standpoint - add on random vanc level to this AM's labs - Check postvoid residual bladder scan and place Foley catheter if indicated - Note that he would never want dialysis and would opt for comfort care measures should his renal function worsen.  Appreciate palliative care  # Hyperkalemia  - got a dose of lokelma today   - note he presented with hypokalemia   # Cellulitis, right foot  - antibiotics per primary team - would avoid vanc for now - AKI in the setting of vanc use and there are no troughs    # CKD stage 3  - baseline Cr 1.4 - 1.8   # HTN with CKD  - appears that midodrine is a home med - he is on this now - noted that he had orthostatics positive yesterday per charting and has been on fluids    # Chronic hypoxic respiratory failure with ILD - He has been on 6 liters oxygen since 2015 per charting   # Hypomagnesemia  - got magnesium 2 mg IV earlier today today    #  Normocytic Anemia  - add on iron panel    Disposition - per primary team   Estanislado Emms 02/25/2023, 2:09 PM

## 2023-02-25 NOTE — Progress Notes (Signed)
PROGRESS NOTE    [name]  ZOX:[id number] DOB: Jul 04, 1956 DOA: 02/15/2023 PCP: Genia Hotter, FNP   Brief Narrative:    67 y.o. 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 The patient's pain has not improved, recurrently febrile, so vancomycin is added. Blood culture repeated with no growth to date.  He is now noted to have worsening AKI and nephrology consultation pending.  Palliative also following as he appears to be hospice appropriate.  Assessment & Plan:   Principal Problem:   Sepsis due to cellulitis [facility]) Active Problems:   Hypokalemia   Type 2 diabetes mellitus ([name])   Hyponatremia   Essential hypertension   Anxiety state   Mixed hyperlipidemia   Smoking   GERD without esophagitis   Peripheral neuropathy   Cellulitis in diabetic foot ([name])   Aortic mural thrombus ([name])   Malnutrition of moderate degree  Assessment and Plan:  Right foot cellulitis/sepsis, present on admission Patient was started on ceftriaxone.  Continue to have febrile episodes so vancomycin was added on 8/3. Blood cultures from 7/29 negative.  Blood cultures were repeated and negative so far. MRI of the right foot did not show any deep abscess.  Findings did suggest cellulitis.  No vascular compromise noted on physical examination. Foot actually has been improving the last 48 hours.  WBC remains normal.  Procalcitonin 0.13. Continue doxycycline   PAD Underwent ABI measurements during this hospital stay.  Right ABI 0.69 (moderate), Left ABI normal.  Continue atorvastatin, ranexa and plavix. Patient with prior transmetatarsal amputation of the left foot No obvious  vascular compromise noted on examination.  His cellulitis appears to be improving.  He can follow-up with vascular surgery in the outpatient setting.  If however his symptoms recur then will have low threshold to do a CT angiogram of right lower extremity.   Essential hypertension:  His diltiazem was held.  Continuing just nitrates for now.   Orthostatics were checked yesterday and he did have drop in his systolic blood pressure of 20 mm from lying to standing position.  He does have a known history of orthostatic hypotension and is noted to be on midodrine.  Continue to monitor. Continue to hold diltiazem for now.   IDT2DM with diabetic peripheral neuropathy: HbA1c 9.3%.  - Continue reduced basal-bolus regimen here.  - Continue lyrica   COPD/emphysema/tobacco abuse: - Continue bronchodilators - Continue nicotine patch Stable for the most part.   AKI-worsening on CKD stage IIIa, oliguric Continue IV fluid Appreciate nephrology evaluation Patient not interested in hemodialysis and palliative following Renal ultrasound with previous right nephrectomy and left side with no obstruction Document strict I's and O's   Aortic mural thrombus:  As seen on CTA chest. Studies do not show a change in embolization risk with chronic anticoagulation and higher risk of destabilizing clot with thrombolysis. We are continuing plavix.   Normocytic anemia Likely has anemia of chronic disease.  Some drop in hemoglobin is noted during this hospital stay.  Hemoglobin stable.   Chronic hypoxic respiratory failure, ILD On 6L O2, says he's been on this since 2015.  - At baseline.    OSA:  - CPAP qHS   Anxiety:  - Continue alprazolam and prozac.  Noted be  on scheduled alprazolam.  Has been somnolent so we will cut back on the dose today.   GERD:  - PPI   Moderate protein calorie malnutrition:  - Supp protein   s/p left shoulder hemiplasty/reduction of chronic dislocation 6/26 by Dr. Everardo Pacific:  -  Continue pain control, PT   Debility: Pt has marked weakness diffusely - PT/OT consulted > SNF rehabilitation being pursued.     DVT prophylaxis:Heparin Code Status: DNR Family Communication: None at bedside Disposition Plan: SNF Status is: Inpatient Remains inpatient appropriate because: Need for IV fluid and medications.   Skin Assessment:  I have examined the patient's skin and I agree with the wound assessment as performed by the wound care RN as outlined below:  Pressure Injury 02/22/23 Sacrum Medial Stage 1 -  Intact skin with non-blanchable redness of a localized area usually over a bony prominence. (Active)  02/22/23 1019  Location: Sacrum  Location Orientation: Medial  Staging: Stage 1 -  Intact skin with non-blanchable redness of a localized area usually over a bony prominence.  Wound Description (Comments):   Present on Admission:   Dressing Type Foam - Lift dressing to assess site every shift 02/24/23 2300    Consultants:  Nephrology Palliative  Procedures:  None  Antimicrobials:  Anti-infectives (From admission, onward)    Start     Dose/Rate Route Frequency Ordered Stop   02/24/23 1200  doxycycline (VIBRA-TABS) tablet 100 mg        100 mg Oral Every 12 hours 02/24/23 1009     02/24/23 0817  vancomycin variable dose per unstable renal function (pharmacist dosing)  Status:  Discontinued         Does not apply See admin instructions 02/24/23 0817 02/24/23 1009   02/21/23 1400  vancomycin (VANCOCIN) IVPB 1000 mg/200 mL premix  Status:  Discontinued        1,000 mg 200 mL/hr over 60 Minutes Intravenous Every 24 hours 02/20/23 1342 02/24/23 0817   02/20/23 1430  vancomycin (VANCOREADY) IVPB 2000 mg/400 mL        2,000 mg 200 mL/hr over 120 Minutes Intravenous  Once 02/20/23 1342 02/20/23 1739   02/16/23 1200  cefTRIAXone (ROCEPHIN) 1 g in sodium chloride 0.9 % 100 mL IVPB        1 g 200 mL/hr over 30 Minutes Intravenous Every 24 hours 02/15/23 1954 02/22/23  1325   02/15/23 1215  cefTRIAXone (ROCEPHIN) 1 g in sodium chloride 0.9 % 100 mL IVPB        1 g 200 mL/hr over 30 Minutes Intravenous  Once 02/15/23 1200 02/15/23 1238      Subjective: Patient seen and evaluated today and states that he overall does not feel well and has some ongoing right foot pain.  He no longer has an appetite and did not have breakfast.  Very poor urine output noted over the last 24 hours.  Objective: Vitals:   02/24/23 1958 02/25/23 0403 02/25/23 0807 02/25/23 0853  BP:  (!) 126/56  (!) 163/75  Pulse:  86  91  Resp:      Temp:  97.8 F (36.6 C)  98.3 F (36.8 C)  TempSrc:  Oral  Oral  SpO2: 93% 92% 93% 94%  Weight:      Height:        Intake/Output Summary (Last 24 hours) at 02/25/2023 1223 Last data filed at 02/25/2023 0600 Gross per 24 hour  Intake 2685.13 ml  Output --  Net 2685.13 ml  Filed Weights   02/15/23 1142 02/15/23 1900  Weight: 90.7 kg 91.9 kg    Examination:  General exam: Appears calm and comfortable  Respiratory system: Clear to auscultation. Respiratory effort normal.  5 L nasal cannula at baseline. Cardiovascular system: S1 & S2 heard, RRR.  Gastrointestinal system: Abdomen is soft Central nervous system: Alert and awake Extremities: Right foot erythema Skin: No significant lesions noted Psychiatry: Flat affect.    Data Reviewed: I have personally reviewed following labs and imaging studies  CBC: Recent Labs  Lab 02/21/23 1031 02/22/23 0441 02/24/23 0510 02/25/23 0442  WBC 10.5 10.2 13.4* 13.5*  HGB 10.6* 9.0* 9.3* 9.1*  HCT 33.7* 28.1* 29.3* 28.3*  MCV 84.3 83.9 83.5 83.5  PLT 240 216 245 224   Basic Metabolic Panel: Recent Labs  Lab 02/20/23 0432 02/21/23 1031 02/22/23 0441 02/24/23 0510 02/25/23 0442  NA 134* 134* 136 130* 130*  K 5.0 4.8 4.1 5.0 5.3*  CL 100 98 102 97* 99  CO2 23 28 26 24 22   GLUCOSE 122* 158* 116* 114* 119*  BUN 16 16 19  33* 43*  CREATININE 1.34* 1.35* 1.42* 2.51* 3.27*  CALCIUM  8.8* 8.7* 8.2* 8.0* 7.9*  MG  --   --   --   --  1.3*   GFR: Estimated Creatinine Clearance: 24.6 mL/min (A) (by C-G formula based on SCr of 3.27 mg/dL (H)). Liver Function Tests: Recent Labs  Lab 02/21/23 1031  AST 12*  ALT 14  ALKPHOS 82  BILITOT 0.3  PROT 7.0  ALBUMIN 2.4*   No results for input(s): "LIPASE", "AMYLASE" in the last 168 hours. No results for input(s): "AMMONIA" in the last 168 hours. Coagulation Profile: No results for input(s): "INR", "PROTIME" in the last 168 hours. Cardiac Enzymes: No results for input(s): "CKTOTAL", "CKMB", "CKMBINDEX", "TROPONINI" in the last 168 hours. BNP (last 3 results) Recent Labs    09/25/22 1412  PROBNP 54.0   HbA1C: No results for input(s): "HGBA1C" in the last 72 hours. CBG: Recent Labs  Lab 02/24/23 1105 02/24/23 1620 02/24/23 2003 02/25/23 0905 02/25/23 1130  GLUCAP 125* 128* 128* 112* 117*   Lipid Profile: No results for input(s): "CHOL", "HDL", "LDLCALC", "TRIG", "CHOLHDL", "LDLDIRECT" in the last 72 hours. Thyroid Function Tests: No results for input(s): "TSH", "T4TOTAL", "FREET4", "T3FREE", "THYROIDAB" in the last 72 hours. Anemia Panel: No results for input(s): "VITAMINB12", "FOLATE", "FERRITIN", "TIBC", "IRON", "RETICCTPCT" in the last 72 hours. Sepsis Labs: Recent Labs  Lab 02/19/23 2252 02/22/23 0441  PROCALCITON  --  0.13  LATICACIDVEN 1.4  --     Recent Results (from the past 240 hour(s))  Culture, blood (single) w Reflex to ID Panel     Status: None   Collection Time: 02/19/23 10:52 PM   Specimen: Right Antecubital; Blood  Result Value Ref Range Status   Specimen Description RIGHT ANTECUBITAL  Final   Special Requests   Final    BOTTLES DRAWN AEROBIC AND ANAEROBIC Blood Culture results may not be optimal due to an excessive volume of blood received in culture bottles   Culture   Final    NO GROWTH 5 DAYS Performed at [facility], [address], [place], [place] [address]    Report Status  02/24/2023 FINAL  Final         Radiology Studies: US RENAL  Result Date: 02/24/2023 CLINICAL DATA:  Acute kidney insufficiency EXAM: RENAL / URINARY TRACT ULTRASOUND COMPLETE COMPARISON:  CT 02/15/2023 FINDINGS: Right Kidney: Previous right nephrectomy.  Left Kidney: Renal measurements: 13.3 x 8.2 x 7.2 cm = volume: 410.1 mL. No collecting system dilatation or perinephric fluid. Exophytic midportion simple appearing cyst measuring 16 mm which is anechoic with through transmission and smooth margins. Bladder: Appears normal for degree of bladder distention. Other: None. IMPRESSION: Previous right nephrectomy. No left-sided collecting system dilatation. Electronically Signed   By: [name] M.D.   On: 02/24/2023 10:18        Scheduled Meds:  atorvastatin  20 mg Oral QHS   clopidogrel  75 mg Oral Daily   dextromethorphan-guaiFENesin  1 tablet Oral BID   doxycycline  100 mg Oral Q12H   feeding supplement (GLUCERNA SHAKE)  237 mL Oral TID BM   FLUoxetine  40 mg Oral Daily   heparin  5,000 Units Subcutaneous Q8H   insulin aspart  0-5 Units Subcutaneous QHS   insulin aspart  0-9 Units Subcutaneous TID WC   insulin detemir  10 Units Subcutaneous QHS   ipratropium-albuterol  3 mL Nebulization TID   midodrine  2.5 mg Oral BID   multivitamin with minerals  1 tablet Oral Daily   nicotine  21 mg Transdermal Daily   pantoprazole  40 mg Oral Daily   ranolazine  500 mg Oral BID   tamsulosin  0.4 mg Oral QPC supper   Continuous Infusions:  sodium chloride 100 mL/hr at 02/24/23 1711     LOS: 10 days    Time spent: 35 minutes     Hoover Brunette, DO [facility]  If 7PM-7AM, please contact night-coverage [email or web address] 02/25/2023, 12:23 PM

## 2023-02-25 NOTE — Progress Notes (Signed)
Mag level 1.3 reported to [facility]

## 2023-02-25 NOTE — Progress Notes (Signed)
Physical Therapy Treatment Patient Details Name: [name] MRN: [id number] DOB: [birth year] Today's Date: 02/25/2023   History of Present Illness [name] is a 67 y.o. 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  Patient reports he had diarrhea at least 5 times last 5 hours.  He actually has an incontinent episode during my exam.  He denies any hematochezia or melena.  Patient reports he has not felt feverish although he did have a fever when he came into the ER.  He has had chest pain.  He describes his pain in the center of his chest and radiating left.  It feels like a heaviness.  It feels similar to when he had a MI in the past.  His troponin in the ER is actually downtrending from 20-17.  Patient reports he has not felt any increase in shortness of breath.  He is on 6 L nasal cannula at baseline.  He has not had any dizziness, malaise, fatigue.  He denies any cough.  Patient does report he has had urinary retention.  He has not had any urine output for the entire day.  He reports prior to that he was not having any dysuria.  Patient reports that yesterday his neighbor noticed that his right lower extremity was swollen.  Patient reports that it had felt like needles and cold.  He attributed it to his neuropathy.  It got worse throughout the night.  The bottom of the foot started hurting badly.  Patient reports that this was not normal for him.  He did not consider that it may be infected.  Patient reports that he takes Lyrica at home and has not had a "neuropathy flare" in quite some time.  His Lyrica was not helping last night.  In regards to patient's diarrhea, patient reports not  having been on any antibiotics recently.  He has had no abdominal pain.  Nobody else around him is having diarrhea.  Patient has no other complaints at this time.    PT Comments  Pt tolerated treatment well. Required min/mod assist to sit EOB and was able to complete LE exercises prior to ambulation. Pt remained on 5L of O2 throughout session. Reported feeling dizzy upon first standing but BP measured at 125/80 and symptoms soon subsided shortly after. Required mod assist to ambulate from chair to doorway and back. Limited due to fatigue. Pt tolerated sitting up in chair at end of session. Patient will benefit from continued skilled physical therapy in hospital and recommended venue below to increase strength, balance, endurance for safe ADLs and gait.    If plan is discharge home, recommend the following: A lot of help with walking and/or transfers;A lot of help with bathing/dressing/bathroom;Assistance with cooking/housework;Assist for transportation;Help with stairs or ramp for entrance   Can travel by private vehicle     Yes  Equipment Recommendations       Recommendations for Other Services       Precautions / Restrictions Precautions Precautions: Fall Restrictions Weight Bearing Restrictions: Yes LUE Weight Bearing: Weight bearing as tolerated     Mobility  Bed Mobility Overal bed mobility: Needs Assistance Bed Mobility: Supine to Sit     Supine to sit: Min assist, HOB elevated  General bed mobility comments: Assist for trunk elevation    Transfers Overall transfer level: Needs assistance Equipment used: Rolling walker (2 wheels) Transfers: Sit to/from Stand Sit to Stand: Mod assist, Min assist   Step pivot transfers: Min assist       General transfer comment: Assist to boost from EOB    Ambulation/Gait Ambulation/Gait assistance: Min assist Gait Distance (Feet): 15 Feet (ambulated from chair to doorway of room and back) Assistive device: Rolling walker  (2 wheels) Gait Pattern/deviations: Step-to pattern, Decreased step length - left, Decreased step length - right, Decreased stride length, Wide base of support Gait velocity: decreased     General Gait Details: slow, labored cadence with RW limited to short shuffling to the doorway due to pt's dizziness and complaints of fatigue.   Stairs             Wheelchair Mobility     Tilt Bed    Modified Rankin (Stroke Patients Only)       Balance Overall balance assessment: Needs assistance Sitting-balance support: Feet supported, Bilateral upper extremity supported Sitting balance-Leahy Scale: Fair Sitting balance - Comments: seated at EOB   Standing balance support: Bilateral upper extremity supported, During functional activity, Reliant on assistive device for balance Standing balance-Leahy Scale: Poor Standing balance comment: poor to fair  with RW                            Cognition Arousal: Alert Behavior During Therapy: WFL for tasks assessed/performed Overall Cognitive Status: Within Functional Limits for tasks assessed                                          Exercises General Exercises - Lower Extremity Ankle Circles/Pumps: AROM, Seated, Strengthening, Right, Left, 10 reps Long Arc Quad: AROM, Seated, Strengthening, Right, Left, 10 reps Hip Flexion/Marching: AROM, Strengthening, 10 reps, Seated, Left, Right Toe Raises: AROM, Seated, Strengthening, Right, Left, 10 reps Heel Raises: AROM, Seated, Strengthening, Right, Left, 10 reps    General Comments        Pertinent Vitals/Pain Pain Assessment Pain Assessment: Faces Faces Pain Scale: Hurts a little bit Pain Location: bilateral LE Pain Descriptors / Indicators: Aching Pain Intervention(s): Limited activity within patient's tolerance, Monitored during session    Home Living Family/patient expects to be discharged to:: Private residence Living Arrangements: Parent Available  Help at Discharge: Personal care attendant;Family;Available PRN/intermittently Type of Home: Mobile home Home Access: Ramped entrance       Home Layout: One level Home Equipment: Agricultural consultant (2 wheels);Rollator (4 wheels);Shower seat;Wheelchair - manual;Electric scooter;Hospital bed;Grab bars - tub/shower;Hand held shower head Additional Comments: Pt reports family is not of help to pt.    Prior Function            PT Goals (current goals can now be found in the care plan section) Acute Rehab PT Goals Patient Stated Goal: Return home with family to assist PT Goal Formulation: With patient Time For Goal Achievement: 03/04/23 Potential to Achieve Goals: Fair Progress towards PT goals: Progressing toward goals    Frequency    Min 3X/week      PT Plan      Co-evaluation     PT goals addressed during session: Mobility/safety with mobility;Balance;Proper use of DME;Strengthening/ROM        AM-PAC PT "6 Clicks" Mobility  Outcome Measure  Help needed turning from your back to your side while in a flat bed without using bedrails?: A Little Help needed moving from lying on your back to sitting on the side of a flat bed without using bedrails?: A Little Help needed moving to and from a bed to a chair (including a wheelchair)?: A Little Help needed standing up from a chair using your arms (e.g., wheelchair or bedside chair)?: A Lot Help needed to walk in hospital room?: A Lot Help needed climbing 3-5 steps with a railing? : Total 6 Click Score: 14    End of Session Equipment Utilized During Treatment: Gait belt Activity Tolerance: Patient limited by fatigue Patient left: with call bell/phone within reach;in bed Nurse Communication: Mobility status PT Visit Diagnosis: Unsteadiness on feet (R26.81);Other abnormalities of gait and mobility (R26.89);Muscle weakness (generalized) (M62.81)     Time: 1010-1043 PT Time Calculation (min) (ACUTE ONLY): 33 min  Charges:     $Therapeutic Exercise: 8-22 mins $Therapeutic Activity: 8-22 mins PT General Charges $$ ACUTE PT VISIT: 1 Visit                      [facility] [place], DPT Program

## 2023-02-25 NOTE — Progress Notes (Signed)
Palliative:  HPI: HPI: 67 y.o. male  with past medical history of stroke, COPD, CAD, HTN, HLD, HFpEF, diabetes, vascular disease with amputations of L foot toes, depression, anxiety, h/o cocaine use, current nicotine use admitted on 02/15/2023 with sepsis with cellulitis RLE with ongoing fevers and pain. Followed by outpatient [facility] palliative team - reviewed notes. Reviewed MOST form.    I met today again with [name]. He tells me that he feels worse today. We discuss that his kidney function is worse. He is consistent in his wishes for NO dialysis and openness to hospice when indicated. However, he does wish to continue conservative treatment with hopes of having more time with his mother. Time for outcomes. He does give me permission to update his family. He wants me to discuss with brother [name] as he does not want to worry his mother. He wishes to [name] to be called in an emergency.   I called and updated [name] on [name]'s status as well as his wishes. [name] feels bad that [name] is having issues with his kidneys but he is not surprised with [name] wishes. He knows [name] desires DNR and did not believe that he would want dialysis. He believes as his brother does that when it is your time, it's your time. [name] understands and is prepared to be speak on [name]'s behalf if needed.   All questions/concerns addressed. Emotional support provided.   Exam: Lethargic. Weak. No distress at rest. HR 80s. Breathing regular, unlabored. Abd soft.   Plan: - DNR - No dialysis - Continue conservative treatment - Open to hospice options (home vs facility) as indicated  50 min  Yong Channel, NP Palliative Medicine Team Pager [phone] (Please see [email or web address] for schedule) Team Phone [phone]    Greater than 50%  of this time was spent counseling and coordinating care related to the above assessment and plan

## 2023-02-26 DIAGNOSIS — L0390 Cellulitis, unspecified: Secondary | ICD-10-CM | POA: Diagnosis not present

## 2023-02-26 DIAGNOSIS — A419 Sepsis, unspecified organism: Secondary | ICD-10-CM | POA: Diagnosis not present

## 2023-02-26 LAB — GLUCOSE, CAPILLARY
Glucose-Capillary: 77 mg/dL (ref 70–99)
Glucose-Capillary: 90 mg/dL (ref 70–99)
Glucose-Capillary: 89 mg/dL (ref 70–99)
Glucose-Capillary: 84 mg/dL (ref 70–99)

## 2023-02-26 LAB — OSMOLALITY: Osmolality: 292 mOsm/kg (ref 275–295)

## 2023-02-26 MED ORDER — FUROSEMIDE 10 MG/ML IJ SOLN
80.0000 mg | Freq: Once | INTRAMUSCULAR | Status: AC
  Administered 2023-02-26: 80 mg via INTRAVENOUS
  Filled 2023-02-26: qty 8

## 2023-02-26 MED ORDER — MAGNESIUM SULFATE 2 GM/50ML IV SOLN
2.0000 g | Freq: Once | INTRAVENOUS | Status: AC
  Administered 2023-02-26: 2 g via INTRAVENOUS
  Filled 2023-02-26: qty 50

## 2023-02-26 MED ORDER — POLYSACCHARIDE IRON COMPLEX 150 MG PO CAPS
150.0000 mg | ORAL_CAPSULE | Freq: Every day | ORAL | Status: DC
  Administered 2023-02-26 – 2023-03-01 (×4): 150 mg via ORAL
  Filled 2023-02-26 (×4): qty 1

## 2023-02-26 NOTE — Progress Notes (Signed)
[facility] Progress Note  Name: [name] MRN: [id number] DOB: [birth year]  Chief Complaint:  "Chest pain and a leg infection"   Subjective:  No urine output was recorded over 8/8.  He has been on normal saline at 100 ml/hr - not actually running anymore on my exam.  Bladder scan was 146 mL post void.  He states that he has not been urinating much.  He feels a little "out of it" and asks me if he is dying.  I asked who in his family that he would like me to update and he would like me to call his brother.   Review of systems:  He states that breathing is "terrible" - worse than normal for him and he's normally on 6 liters  Denies chest pain  Denies n/v/d  --------------------  Background on consult:  [name] is a 67 y.o. male with a history of cocaine abuse, COPD, coronary artery disease, hypertension, type 2 DM, nephrolithiasis, prior stroke, radical right nephrectomy for renal cell, and obstructive sleep apnea who presented to the hospital with chest pain and diarrhea.  He was found to have sepsis which was felt secondary to cellulitis.  He was febrile and tachycardic on admission and had a lactic acidosis.  He was started on Rocephin.  He was found to have acute kidney injury and was given IV fluids as well.  Note that he has an aortic mural thrombus seen on CTA chest previously.  He is on Plavix and cardiology has recommended continuing the same.  Nephrology is consulted for assistance with management of AKI.  Palliative care has seen him outpatient and here in the hospital as well.  He has expressed to them that he would never want dialysis or aggressive care and would welcome hospice support at home as needed.  Strict ins and outs are not available.  He appears to have had 3 urine voids charted over 8/7 and at least 1-2 liters per day in addition to unmeasured urine voids.  He has most recently been on NS at 100 ml/hr.  He shares that his mother is my clinic  patient.  He states to me that he would never want dialysis "there is too much wrong with me".  He had a renal US with evidence of previous right nephrectomy and no collecting system dilatation.  He states that they have scanned to check for retention as well.     Intake/Output Summary (Last 24 hours) at 02/26/2023 1100 Last data filed at 02/26/2023 0500 Gross per 24 hour  Intake 1303.22 ml  Output --  Net 1303.22 ml    Vitals:  Vitals:   02/26/23 0625 02/26/23 0800 02/26/23 0852 02/26/23 1010  BP: (!) 145/74     Pulse: 87     Resp:  20  (!) 23  Temp: 98.7 F (37.1 C)     TempSrc: Axillary     SpO2: 92%  90% 95%  Weight:      Height:         Physical Exam:  General adult male in bed chronically ill-appearing HEENT normocephalic atraumatic extraocular movements intact sclera anicteric Neck supple trachea midline Lungs reduced on auscultation bilaterally normal work of breathing at rest and increased with exertion - on 5 liters oxygen.  I moved cannula to his nose as out of place.  Heart S1S2  Abdomen soft nontender distended/obese habitus Extremities no lower extremity edema Psych normal mood and affect Neuro - he is able  to tell me his name, year, location and some basic history but states he feels a little off  GU - no foley    Medications reviewed   Labs:     Latest Ref Rng & Units 02/26/2023    4:32 AM 02/25/2023    1:27 PM 02/25/2023    4:42 AM  BMP  Glucose 70 - 99 mg/dL 98   161   BUN 8 - 23 mg/dL 54   43   Creatinine 0.96 - 1.24 mg/dL 0.45   4.09   Sodium 811 - 145 mmol/L 129   130   Potassium 3.5 - 5.1 mmol/L 5.0  5.6  5.3   Chloride 98 - 111 mmol/L 99   99   CO2 22 - 32 mmol/L 19   22   Calcium 8.9 - 10.3 mg/dL 7.8   7.9      Assessment/Plan:   # AKI  -May be prerenal in the setting of sepsis as well as diuretic use.  He is on torsemide 60 mg BID at home which has been held.  He was initiated on vanc and this is now off.  No troughs available.  His level on  8/8 at 14:42 was 14 (after a last dose on 02/23/23 at 14:50) suggesting that he may have had uncaptured supra-therapeutic levels  - Strict ins and outs - Stop fluids  - Note that he would never want dialysis and would opt for comfort care measures should his renal function worsen.  Appreciate palliative care and primary team    # Hyperkalemia  - improved   - note he actually presented with hypokalemia    # Cellulitis, right foot  - antibiotics per primary team - would avoid vanc for now - AKI in the setting of vanc use and concern for uncaptured supra-therapeutic levels    # CKD stage 3  - baseline Cr 1.4 - 1.8    # HTN with CKD  - appears that midodrine is a home med - he is on this now - noted that he had orthostatics positive yesterday per charting and has been on fluids     # Hyponatremia  - Stop fluids. Also not tolerating from a respiratory standpoint.  Also giving lasix    - Check serum and urine osms and urine sodium however see fluid management above   - note that he is on fluoxetine and will need to stop if hyponatremia worsens.  Given goals of care would rather not do this if possible   # Chronic hypoxic respiratory failure with ILD - He has been on 6 liters oxygen since 2015    # Hypomagnesemia  - magnesium 2 mg IV once today    # Normocytic Anemia  - iron deficiency - start PO iron.  May need IV iron     Disposition - per primary team.  Nephrology will follow labs over the weekend and review his chart.  Please reach out if acute concerns.    Estanislado Emms, MD 02/26/2023 11:51 AM

## 2023-02-26 NOTE — Progress Notes (Signed)
PROGRESS NOTE    [name]  PXT:[id number] DOB: 07/01/1956 DOA: 02/15/2023 PCP: Genia Hotter, FNP   Brief Narrative:    67 y.o. 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 The patient's pain has not improved, recurrently febrile, so vancomycin is added. Blood culture repeated with no growth to date.  He is now noted to have worsening AKI and nephrology consultation pending.  Palliative also following as he appears to be hospice appropriate.  Assessment & Plan:   Principal Problem:   Sepsis due to cellulitis [id number]) Active Problems:   Hypokalemia   Type 2 diabetes mellitus ([name])   Hyponatremia   Essential hypertension   Anxiety state   Mixed hyperlipidemia   Smoking   GERD without esophagitis   Peripheral neuropathy   Cellulitis in diabetic foot ([name])   Aortic mural thrombus ([name])   Malnutrition of moderate degree  Assessment and Plan:  Right foot cellulitis/sepsis, present on admission Patient was started on ceftriaxone.  Continue to have febrile episodes so vancomycin was added on 8/3. Blood cultures from 7/29 negative.  Blood cultures were repeated and negative so far. MRI of the right foot did not show any deep abscess.  Findings did suggest cellulitis.  No vascular compromise noted on physical examination. Foot actually has been improving the last 48 hours.  WBC remains normal.  Procalcitonin 0.13. Continue doxycycline   PAD Underwent ABI measurements during this hospital stay.  Right ABI 0.69 (moderate), Left ABI normal.  Continue atorvastatin, ranexa and plavix. Patient with prior transmetatarsal amputation of the left foot No obvious  vascular compromise noted on examination.  His cellulitis appears to be improving.  He can follow-up with vascular surgery in the outpatient setting.  If however his symptoms recur then will have low threshold to do a CT angiogram of right lower extremity.   Essential hypertension:  His diltiazem was held.  Continuing just nitrates for now.   Orthostatics were checked yesterday and he did have drop in his systolic blood pressure of 20 mm from lying to standing position.  He does have a known history of orthostatic hypotension and is noted to be on midodrine.  Continue to monitor. Continue to hold diltiazem for now.   IDT2DM with diabetic peripheral neuropathy: HbA1c 9.3%.  -Continue SSI and discontinue long-acting insulin as patient is not eating very much - Continue lyrica   COPD/emphysema/tobacco abuse: - Continue bronchodilators - Continue nicotine patch Stable for the most part.  Hypomagnesemia Replete and reevaluate in a.m.   AKI-worsening on CKD stage IIIa, oliguric IV fluid discontinued per nephrology and 1 dose of IV Lasix ordered Appreciate nephrology evaluation Patient not interested in hemodialysis and palliative following Renal ultrasound with previous right nephrectomy and left side with no obstruction Document strict I's and O's   Aortic mural thrombus:  As seen on CTA chest. Studies do not show a change in embolization risk with chronic anticoagulation and higher risk of destabilizing clot with thrombolysis. We are continuing plavix.   Normocytic anemia Likely has anemia of chronic disease.  Some drop in hemoglobin is noted during this hospital stay.  Hemoglobin stable.   Chronic hypoxic respiratory failure, ILD On 6L O2, says he's been on this since 2015.  -  At baseline.    OSA:  - CPAP qHS   Anxiety:  - Continue alprazolam and prozac.  Noted be on scheduled alprazolam.  Has been somnolent so we will cut back on the dose today.   GERD:  - PPI   Moderate  protein calorie malnutrition:  - Supp protein   s/p left shoulder hemiplasty/reduction of chronic dislocation 6/26 by Dr. Everardo Pacific:  - Continue pain control, PT   Debility: Pt has marked weakness diffusely - PT/OT consulted > SNF rehabilitation being pursued.     DVT prophylaxis:Heparin Code Status: DNR Family Communication: None at bedside Disposition Plan: SNF, anticipate likely transition to hospice/comfort care soon Status is: Inpatient Remains inpatient appropriate because: Need for IV fluid and medications.   Skin Assessment:  I have examined the patient's skin and I agree with the wound assessment as performed by the wound care RN as outlined below:  Pressure Injury 02/22/23 Sacrum Medial Stage 1 -  Intact skin with non-blanchable redness of a localized area usually over a bony prominence. (Active)  02/22/23 1019  Location: Sacrum  Location Orientation: Medial  Staging: Stage 1 -  Intact skin with non-blanchable redness of a localized area usually over a bony prominence.  Wound Description (Comments):   Present on Admission:   Dressing Type Foam - Lift dressing to assess site every shift 02/25/23 0853    Consultants:  Nephrology Palliative  Procedures:  None  Antimicrobials:  Anti-infectives (From admission, onward)    Start     Dose/Rate Route Frequency Ordered Stop   02/24/23 1200  doxycycline (VIBRA-TABS) tablet 100 mg        100 mg Oral Every 12 hours 02/24/23 1009     02/24/23 0817  vancomycin variable dose per unstable renal function (pharmacist dosing)  Status:  Discontinued         Does not apply See admin instructions 02/24/23 0817 02/24/23 1009   02/21/23 1400  vancomycin (VANCOCIN) IVPB 1000 mg/200 mL premix  Status:  Discontinued        1,000 mg 200 mL/hr over 60 Minutes Intravenous Every 24 hours 02/20/23 1342 02/24/23 0817   02/20/23 1430  vancomycin (VANCOREADY) IVPB 2000 mg/400 mL        2,000 mg 200 mL/hr over 120 Minutes Intravenous  Once  02/20/23 1342 02/20/23 1739   02/16/23 1200  cefTRIAXone (ROCEPHIN) 1 g in sodium chloride 0.9 % 100 mL IVPB        1 g 200 mL/hr over 30 Minutes Intravenous Every 24 hours 02/15/23 1954 02/22/23 1325   02/15/23 1215  cefTRIAXone (ROCEPHIN) 1 g in sodium chloride 0.9 % 100 mL IVPB        1 g 200 mL/hr over 30 Minutes Intravenous  Once 02/15/23 1200 02/15/23 1238      Subjective: Patient seen and evaluated today and states that he overall does not feel well and has some ongoing right foot pain.  He no longer has an appetite and overall is not feeling very well.  Very poor urine output noted over the last 24 hours.  Objective: Vitals:   02/26/23 0625 02/26/23 0800 02/26/23 0852 02/26/23 1010  BP: (!) 145/74     Pulse: 87     Resp:  20  (!) 23  Temp: 98.7 F (37.1 C)     TempSrc: Axillary     SpO2: 92%  90% 95%  Weight:      Height:        Intake/Output Summary (Last  24 hours) at 02/26/2023 1222 Last data filed at 02/26/2023 1040 Gross per 24 hour  Intake 1423.22 ml  Output --  Net 1423.22 ml   Filed Weights   02/15/23 1142 02/15/23 1900  Weight: 90.7 kg 91.9 kg    Examination:  General exam: Appears calm and comfortable  Respiratory system: Clear to auscultation. Respiratory effort normal.  5 L nasal cannula at baseline. Cardiovascular system: S1 & S2 heard, RRR.  Gastrointestinal system: Abdomen is soft Central nervous system: Alert and awake Extremities: Right foot erythema Skin: No significant lesions noted Psychiatry: Flat affect.    Data Reviewed: I have personally reviewed following labs and imaging studies  CBC: Recent Labs  Lab 02/21/23 1031 02/22/23 0441 02/24/23 0510 02/25/23 0442 02/26/23 0432  WBC 10.5 10.2 13.4* 13.5* 11.9*  HGB 10.6* 9.0* 9.3* 9.1* 9.1*  HCT 33.7* 28.1* 29.3* 28.3* 28.6*  MCV 84.3 83.9 83.5 83.5 83.1  PLT 240 216 245 224 205   Basic Metabolic Panel: Recent Labs  Lab 02/21/23 1031 02/22/23 0441 02/24/23 0510  02/25/23 0442 02/25/23 1327 02/26/23 0432  NA 134* 136 130* 130*  --  129*  K 4.8 4.1 5.0 5.3* 5.6* 5.0  CL 98 102 97* 99  --  99  CO2 28 26 24 22   --  19*  GLUCOSE 158* 116* 114* 119*  --  98  BUN 16 19 33* 43*  --  54*  CREATININE 1.35* 1.42* 2.51* 3.27*  --  3.87*  CALCIUM 8.7* 8.2* 8.0* 7.9*  --  7.8*  MG  --   --   --  1.3*  --  1.6*   GFR: Estimated Creatinine Clearance: 20.7 mL/min (A) (by C-G formula based on SCr of 3.87 mg/dL (H)). Liver Function Tests: Recent Labs  Lab 02/21/23 1031  AST 12*  ALT 14  ALKPHOS 82  BILITOT 0.3  PROT 7.0  ALBUMIN 2.4*   No results for input(s): "LIPASE", "AMYLASE" in the last 168 hours. No results for input(s): "AMMONIA" in the last 168 hours. Coagulation Profile: No results for input(s): "INR", "PROTIME" in the last 168 hours. Cardiac Enzymes: No results for input(s): "CKTOTAL", "CKMB", "CKMBINDEX", "TROPONINI" in the last 168 hours. BNP (last 3 results) Recent Labs    09/25/22 1412  PROBNP 54.0   HbA1C: No results for input(s): "HGBA1C" in the last 72 hours. CBG: Recent Labs  Lab 02/25/23 1130 02/25/23 1626 02/25/23 2204 02/26/23 0733 02/26/23 1106  GLUCAP 117* 141* 110* 77 90   Lipid Profile: No results for input(s): "CHOL", "HDL", "LDLCALC", "TRIG", "CHOLHDL", "LDLDIRECT" in the last 72 hours. Thyroid Function Tests: No results for input(s): "TSH", "T4TOTAL", "FREET4", "T3FREE", "THYROIDAB" in the last 72 hours. Anemia Panel: Recent Labs    02/25/23 1327  FERRITIN 261  TIBC 172*  IRON 22*   Sepsis Labs: Recent Labs  Lab 02/19/23 2252 02/22/23 0441  PROCALCITON  --  0.13  LATICACIDVEN 1.4  --     Recent Results (from the past 240 hour(s))  Culture, blood (single) w Reflex to ID Panel     Status: None   Collection Time: 02/19/23 10:52 PM   Specimen: Right Antecubital; Blood  Result Value Ref Range Status   Specimen Description RIGHT ANTECUBITAL  Final   Special Requests   Final    BOTTLES DRAWN  AEROBIC AND ANAEROBIC Blood Culture results may not be optimal due to an excessive volume of blood received in culture bottles   Culture   Final  NO GROWTH 5 DAYS Performed at [facility], [address] [place], [place] [address]    Report Status 02/24/2023 FINAL  Final         Radiology Studies: No results found.      Scheduled Meds:  atorvastatin  20 mg Oral QHS   clopidogrel  75 mg Oral Daily   dextromethorphan-guaiFENesin  1 tablet Oral BID   doxycycline  100 mg Oral Q12H   FLUoxetine  40 mg Oral Daily   furosemide  80 mg Intravenous Once   heparin  5,000 Units Subcutaneous Q8H   insulin aspart  0-5 Units Subcutaneous QHS   insulin aspart  0-9 Units Subcutaneous TID WC   ipratropium-albuterol  3 mL Nebulization BID   iron polysaccharides  150 mg Oral Daily   midodrine  2.5 mg Oral BID   multivitamin with minerals  1 tablet Oral Daily   nicotine  21 mg Transdermal Daily   pantoprazole  40 mg Oral Daily   ranolazine  500 mg Oral BID   tamsulosin  0.4 mg Oral QPC supper   Continuous Infusions:  magnesium sulfate bolus IVPB       LOS: 11 days    Time spent: 35 minutes     Hoover Brunette, DO [facility]  If 7PM-7AM, please contact night-coverage [email or web address] 02/26/2023, 12:22 PM

## 2023-02-26 NOTE — Progress Notes (Signed)
Assisted to bladder scan pt and it was only 87ml. Let the assigned nurse French Ana, RN aware.

## 2023-02-26 NOTE — Care Management Important Message (Signed)
Important Message  Patient Details  Name: [name] MRN: [id number] Date of Birth: [birth year]   Medicare Important Message Given:  Yes     Corey Harold 02/26/2023, 10:45 AM

## 2023-02-27 DIAGNOSIS — A419 Sepsis, unspecified organism: Secondary | ICD-10-CM | POA: Diagnosis not present

## 2023-02-27 DIAGNOSIS — L0390 Cellulitis, unspecified: Secondary | ICD-10-CM | POA: Diagnosis not present

## 2023-02-27 LAB — GLUCOSE, CAPILLARY
Glucose-Capillary: 76 mg/dL (ref 70–99)
Glucose-Capillary: 82 mg/dL (ref 70–99)
Glucose-Capillary: 77 mg/dL (ref 70–99)
Glucose-Capillary: 136 mg/dL — ABNORMAL HIGH (ref 70–99)

## 2023-02-27 LAB — POTASSIUM: Potassium: 5.8 mmol/L — ABNORMAL HIGH (ref 3.5–5.1)

## 2023-02-27 MED ORDER — SODIUM ZIRCONIUM CYCLOSILICATE 10 G PO PACK
10.0000 g | PACK | Freq: Once | ORAL | Status: AC
  Administered 2023-02-27: 10 g via ORAL
  Filled 2023-02-27: qty 1

## 2023-02-27 NOTE — Plan of Care (Signed)
?  Problem: Clinical Measurements: ?Goal: Ability to avoid or minimize complications of infection will improve ?Outcome: Progressing ?  ?Problem: Skin Integrity: ?Goal: Skin integrity will improve ?Outcome: Progressing ?  ?

## 2023-02-27 NOTE — Progress Notes (Signed)
PROGRESS NOTE    [name]  ZOX:[id number] DOB: 1956/02/06 DOA: 02/15/2023 PCP: Genia Hotter, FNP   Brief Narrative:    67 y.o. 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 The patient's pain has not improved, recurrently febrile, so vancomycin is added. Blood culture repeated with no growth to date.  He is now noted to have worsening AKI and nephrology consultation pending.  Palliative also following as he appears to be hospice appropriate.  Assessment & Plan:   Principal Problem:   Sepsis due to cellulitis [facility]) Active Problems:   Hypokalemia   Type 2 diabetes mellitus ([name])   Hyponatremia   Essential hypertension   Anxiety state   Mixed hyperlipidemia   Smoking   GERD without esophagitis   Peripheral neuropathy   Cellulitis in diabetic foot ([name])   Aortic mural thrombus ([name])   Malnutrition of moderate degree  Assessment and Plan:  Right foot cellulitis/sepsis, present on admission Patient was started on ceftriaxone.  Continue to have febrile episodes so vancomycin was added on 8/3. Blood cultures from 7/29 negative.  Blood cultures were repeated and negative so far. MRI of the right foot did not show any deep abscess.  Findings did suggest cellulitis.  No vascular compromise noted on physical examination. Foot actually has been improving the last 48 hours.  WBC remains normal.  Procalcitonin 0.13. Continue doxycycline   PAD Underwent ABI measurements during this hospital stay.  Right ABI 0.69 (moderate), Left ABI normal.  Continue atorvastatin, ranexa and plavix. Patient with prior transmetatarsal amputation of the left foot No obvious  vascular compromise noted on examination.  His cellulitis appears to be improving.  He can follow-up with vascular surgery in the outpatient setting.  If however his symptoms recur then will have low threshold to do a CT angiogram of right lower extremity.   Essential hypertension:  His diltiazem was held.  Continuing just nitrates for now.   Orthostatics were checked yesterday and he did have drop in his systolic blood pressure of 20 mm from lying to standing position.  He does have a known history of orthostatic hypotension and is noted to be on midodrine.  Continue to monitor. Continue to hold diltiazem for now.   IDT2DM with diabetic peripheral neuropathy: HbA1c 9.3%.  -Continue SSI and discontinue long-acting insulin as patient is not eating very much - Continue lyrica   COPD/emphysema/tobacco abuse: - Continue bronchodilators - Continue nicotine patch Stable for the most part.  Hyperkalemia Administer Lokelma and recheck this afternoon Plan for Kayexalate due to no bowel movement if still elevated   AKI-worsening on CKD stage IIIa, oliguric IV fluid discontinued per nephrology and 1 dose of IV Lasix ordered Appreciate nephrology evaluation Patient not interested in hemodialysis and palliative following Renal ultrasound with previous right nephrectomy and left side with no obstruction Document strict I's and O's Likely transition to comfort care by a.m. if still worsening due to no desire to have HD   Aortic mural thrombus:  As seen on CTA chest. Studies do not show a change in embolization risk with chronic anticoagulation and higher risk of destabilizing clot with thrombolysis. We are continuing plavix.   Normocytic anemia Likely has anemia of chronic disease.  Some drop in  hemoglobin is noted during this hospital stay.  Hemoglobin stable.   Chronic hypoxic respiratory failure, ILD On 6L O2, says he's been on this since 2015.  - At baseline.    OSA:  - CPAP qHS    Anxiety:  - Continue alprazolam and prozac.  Noted be on scheduled alprazolam.  Has been somnolent so we will cut back on the dose today.   GERD:  - PPI   Moderate protein calorie malnutrition:  - Supp protein   s/p left shoulder hemiplasty/reduction of chronic dislocation 6/26 by Dr. Everardo Pacific:  - Continue pain control, PT   Debility: Pt has marked weakness diffusely - PT/OT consulted > SNF rehabilitation being pursued.     DVT prophylaxis:Heparin Code Status: DNR Family Communication: Discussed with brother [name] 8/9 Disposition Plan: SNF, anticipate likely transition to hospice/comfort care soon Status is: Inpatient Remains inpatient appropriate because: Need for IV fluid and medications.   Skin Assessment:  I have examined the patient's skin and I agree with the wound assessment as performed by the wound care RN as outlined below:  Pressure Injury 02/22/23 Sacrum Medial Stage 1 -  Intact skin with non-blanchable redness of a localized area usually over a bony prominence. (Active)  02/22/23 1019  Location: Sacrum  Location Orientation: Medial  Staging: Stage 1 -  Intact skin with non-blanchable redness of a localized area usually over a bony prominence.  Wound Description (Comments):   Present on Admission:   Dressing Type Foam - Lift dressing to assess site every shift 02/25/23 0853    Consultants:  Nephrology Palliative  Procedures:  None  Antimicrobials:  Anti-infectives (From admission, onward)    Start     Dose/Rate Route Frequency Ordered Stop   02/24/23 1200  doxycycline (VIBRA-TABS) tablet 100 mg        100 mg Oral Every 12 hours 02/24/23 1009     02/24/23 0817  vancomycin variable dose per unstable renal function (pharmacist dosing)  Status:  Discontinued         Does not apply See admin instructions 02/24/23 0817 02/24/23 1009   02/21/23 1400  vancomycin (VANCOCIN) IVPB 1000 mg/200 mL premix  Status:  Discontinued        1,000 mg 200 mL/hr over 60  Minutes Intravenous Every 24 hours 02/20/23 1342 02/24/23 0817   02/20/23 1430  vancomycin (VANCOREADY) IVPB 2000 mg/400 mL        2,000 mg 200 mL/hr over 120 Minutes Intravenous  Once 02/20/23 1342 02/20/23 1739   02/16/23 1200  cefTRIAXone (ROCEPHIN) 1 g in sodium chloride 0.9 % 100 mL IVPB        1 g 200 mL/hr over 30 Minutes Intravenous Every 24 hours 02/15/23 1954 02/22/23 1325   02/15/23 1215  cefTRIAXone (ROCEPHIN) 1 g in sodium chloride 0.9 % 100 mL IVPB        1 g 200 mL/hr over 30 Minutes Intravenous  Once 02/15/23 1200 02/15/23 1238      Subjective: Patient seen and evaluated today and states that he overall does not feel well and has some ongoing right foot pain.  He no longer has an appetite and overall is not feeling very well.  Very poor urine output noted over the last 24 hours.  Objective: Vitals:   02/26/23 2059 02/27/23 0400 02/27/23 0903 02/27/23 1312  BP: (!) 138/90 (!) 140/88  103/64  Pulse: 87 81  84  Resp: 20     Temp: 98.1 F (36.7 C) 98.4 F (  36.9 C)  98.5 F (36.9 C)  TempSrc: Oral Oral  Oral  SpO2: 97% 97% 95% 96%  Weight:      Height:        Intake/Output Summary (Last 24 hours) at 02/27/2023 1418 Last data filed at 02/27/2023 1041 Gross per 24 hour  Intake 495.61 ml  Output 0 ml  Net 495.61 ml   Filed Weights   02/15/23 1142 02/15/23 1900  Weight: 90.7 kg 91.9 kg    Examination:  General exam: Appears calm and comfortable  Respiratory system: Clear to auscultation. Respiratory effort normal.  5 L nasal cannula at baseline. Cardiovascular system: S1 & S2 heard, RRR.  Gastrointestinal system: Abdomen is soft Central nervous system: Alert and awake Extremities: Right foot erythema Skin: No significant lesions noted Psychiatry: Flat affect.    Data Reviewed: I have personally reviewed following labs and imaging studies  CBC: Recent Labs  Lab 02/22/23 0441 02/24/23 0510 02/25/23 0442 02/26/23 0432 02/27/23 0526  WBC 10.2 13.4*  13.5* 11.9* 10.6*  HGB 9.0* 9.3* 9.1* 9.1* 9.7*  HCT 28.1* 29.3* 28.3* 28.6* 31.3*  MCV 83.9 83.5 83.5 83.1 83.2  PLT 216 245 224 205 216   Basic Metabolic Panel: Recent Labs  Lab 02/22/23 0441 02/24/23 0510 02/25/23 0442 02/25/23 1327 02/26/23 0432 02/27/23 0526  NA 136 130* 130*  --  129* 131*  K 4.1 5.0 5.3* 5.6* 5.0 5.5*  CL 102 97* 99  --  99 99  CO2 26 24 22   --  19* 19*  GLUCOSE 116* 114* 119*  --  98 91  BUN 19 33* 43*  --  54* 67*  CREATININE 1.42* 2.51* 3.27*  --  3.87* 4.50*  CALCIUM 8.2* 8.0* 7.9*  --  7.8* 8.1*  MG  --   --  1.3*  --  1.6* 1.8   GFR: Estimated Creatinine Clearance: 17.8 mL/min (A) (by C-G formula based on SCr of 4.5 mg/dL (H)). Liver Function Tests: Recent Labs  Lab 02/21/23 1031  AST 12*  ALT 14  ALKPHOS 82  BILITOT 0.3  PROT 7.0  ALBUMIN 2.4*   No results for input(s): "LIPASE", "AMYLASE" in the last 168 hours. No results for input(s): "AMMONIA" in the last 168 hours. Coagulation Profile: No results for input(s): "INR", "PROTIME" in the last 168 hours. Cardiac Enzymes: No results for input(s): "CKTOTAL", "CKMB", "CKMBINDEX", "TROPONINI" in the last 168 hours. BNP (last 3 results) Recent Labs    09/25/22 1412  PROBNP 54.0   HbA1C: No results for input(s): "HGBA1C" in the last 72 hours. CBG: Recent Labs  Lab 02/26/23 1106 02/26/23 1612 02/26/23 2058 02/27/23 0810 02/27/23 1150  GLUCAP 90 89 84 76 82   Lipid Profile: No results for input(s): "CHOL", "HDL", "LDLCALC", "TRIG", "CHOLHDL", "LDLDIRECT" in the last 72 hours. Thyroid Function Tests: No results for input(s): "TSH", "T4TOTAL", "FREET4", "T3FREE", "THYROIDAB" in the last 72 hours. Anemia Panel: Recent Labs    02/25/23 1327  FERRITIN 261  TIBC 172*  IRON 22*   Sepsis Labs: Recent Labs  Lab 02/22/23 0441  PROCALCITON 0.13    Recent Results (from the past 240 hour(s))  Culture, blood (single) w Reflex to ID Panel     Status: None   Collection Time:  02/19/23 10:52 PM   Specimen: Right Antecubital; Blood  Result Value Ref Range Status   Specimen Description RIGHT ANTECUBITAL  Final   Special Requests   Final    BOTTLES DRAWN AEROBIC AND ANAEROBIC Blood Culture  results may not be optimal due to an excessive volume of blood received in culture bottles   Culture   Final    NO GROWTH 5 DAYS Performed at [facility], [address], [place], [place] [address]    Report Status 02/24/2023 FINAL  Final         Radiology Studies: No results found.      Scheduled Meds:  atorvastatin  20 mg Oral QHS   clopidogrel  75 mg Oral Daily   dextromethorphan-guaiFENesin  1 tablet Oral BID   doxycycline  100 mg Oral Q12H   FLUoxetine  40 mg Oral Daily   heparin  5,000 Units Subcutaneous Q8H   insulin aspart  0-5 Units Subcutaneous QHS   insulin aspart  0-9 Units Subcutaneous TID WC   ipratropium-albuterol  3 mL Nebulization BID   iron polysaccharides  150 mg Oral Daily   midodrine  2.5 mg Oral BID   multivitamin with minerals  1 tablet Oral Daily   nicotine  21 mg Transdermal Daily   pantoprazole  40 mg Oral Daily   ranolazine  500 mg Oral BID   tamsulosin  0.4 mg Oral QPC supper      LOS: 12 days    Time spent: 35 minutes     Hoover Brunette, DO [facility]  If 7PM-7AM, please contact night-coverage [email or web address] 02/27/2023, 2:18 PM

## 2023-02-27 NOTE — Progress Notes (Signed)
Looks like kidney function is worse and pt has had no UOP-  k is up this AM as well. We dont have a great plan to reverse this-  pts wishes noted for no dialysis under any circumstances.  Given lokelma today and palliative involved.  Please call if you all have specific questions   Cecille Aver

## 2023-02-28 DIAGNOSIS — L0390 Cellulitis, unspecified: Secondary | ICD-10-CM | POA: Diagnosis not present

## 2023-02-28 DIAGNOSIS — A419 Sepsis, unspecified organism: Secondary | ICD-10-CM | POA: Diagnosis not present

## 2023-02-28 LAB — BASIC METABOLIC PANEL WITH GFR
Anion gap: 10 (ref 5–15)
BUN: 81 mg/dL — ABNORMAL HIGH (ref 8–23)
CO2: 20 mmol/L — ABNORMAL LOW (ref 22–32)
Calcium: 8 mg/dL — ABNORMAL LOW (ref 8.9–10.3)
Chloride: 99 mmol/L (ref 98–111)
Creatinine, Ser: 4.96 mg/dL — ABNORMAL HIGH (ref 0.61–1.24)
GFR, Estimated: 12 mL/min — ABNORMAL LOW (ref >60)
Glucose, Bld: 103 mg/dL — ABNORMAL HIGH (ref 70–99)
Potassium: 5.4 mmol/L — ABNORMAL HIGH (ref 3.5–5.1)
Sodium: 129 mmol/L — ABNORMAL LOW (ref 135–145)

## 2023-02-28 LAB — CBC
HCT: 30.6 % — ABNORMAL LOW (ref 39.0–52.0)
Hemoglobin: 9.7 g/dL — ABNORMAL LOW (ref 13.0–17.0)
MCH: 26.1 pg (ref 26.0–34.0)
MCHC: 31.7 g/dL (ref 30.0–36.0)
MCV: 82.5 fL (ref 80.0–100.0)
Platelets: 229 10*3/uL (ref 150–400)
RBC: 3.71 MIL/uL — ABNORMAL LOW (ref 4.22–5.81)
RDW: 14.3 % (ref 11.5–15.5)
WBC: 9.3 10*3/uL (ref 4.0–10.5)
nRBC: 0 % (ref 0.0–0.2)

## 2023-02-28 LAB — GLUCOSE, CAPILLARY
Glucose-Capillary: 107 mg/dL — ABNORMAL HIGH (ref 70–99)
Glucose-Capillary: 136 mg/dL — ABNORMAL HIGH (ref 70–99)
Glucose-Capillary: 107 mg/dL — ABNORMAL HIGH (ref 70–99)
Glucose-Capillary: 127 mg/dL — ABNORMAL HIGH (ref 70–99)

## 2023-02-28 LAB — MAGNESIUM: Magnesium: 1.8 mg/dL (ref 1.7–2.4)

## 2023-02-28 MED ORDER — SODIUM POLYSTYRENE SULFONATE 15 GM/60ML PO SUSP
30.0000 g | Freq: Once | ORAL | Status: AC
  Administered 2023-02-28: 30 g via RECTAL
  Filled 2023-02-28: qty 120

## 2023-02-28 MED ORDER — SODIUM ZIRCONIUM CYCLOSILICATE 10 G PO PACK
10.0000 g | PACK | Freq: Two times a day (BID) | ORAL | Status: DC
  Administered 2023-02-28 – 2023-03-02 (×5): 10 g via ORAL
  Filled 2023-02-28 (×5): qty 1

## 2023-02-28 NOTE — Plan of Care (Signed)
?  Problem: Clinical Measurements: ?Goal: Ability to avoid or minimize complications of infection will improve ?Outcome: Progressing ?  ?Problem: Skin Integrity: ?Goal: Skin integrity will improve ?Outcome: Progressing ?  ?

## 2023-02-28 NOTE — Progress Notes (Signed)
PROGRESS NOTE    [name]  ZOX:[id number] DOB: Mar 24, 1956 DOA: 02/15/2023 PCP: Genia Hotter, FNP   Brief Narrative:    67 y.o. 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 The patient's pain has not improved, recurrently febrile, so vancomycin is added. Blood culture repeated with no growth to date.  He is now noted to have worsening AKI and nephrology consultation pending.  Palliative also following as he appears to be hospice appropriate.  Assessment & Plan:   Principal Problem:   Sepsis due to cellulitis [facility]) Active Problems:   Hypokalemia   Type 2 diabetes mellitus ([name])   Hyponatremia   Essential hypertension   Anxiety state   Mixed hyperlipidemia   Smoking   GERD without esophagitis   Peripheral neuropathy   Cellulitis in diabetic foot ([name])   Aortic mural thrombus ([name])   Malnutrition of moderate degree  Assessment and Plan:  Right foot cellulitis/sepsis, present on admission Patient was started on ceftriaxone.  Continue to have febrile episodes so vancomycin was added on 8/3. Blood cultures from 7/29 negative.  Blood cultures were repeated and negative so far. MRI of the right foot did not show any deep abscess.  Findings did suggest cellulitis.  No vascular compromise noted on physical examination. Foot actually has been improving the last 48 hours.  WBC remains normal.  Procalcitonin 0.13. Continue doxycycline   PAD Underwent ABI measurements during this hospital stay.  Right ABI 0.69 (moderate), Left ABI normal.  Continue atorvastatin, ranexa and plavix. Patient with prior transmetatarsal amputation of the left foot No obvious  vascular compromise noted on examination.  His cellulitis appears to be improving.  He can follow-up with vascular surgery in the outpatient setting.  If however his symptoms recur then will have low threshold to do a CT angiogram of right lower extremity.   Essential hypertension:  His diltiazem was held.  Continuing just nitrates for now.   Orthostatics were checked yesterday and he did have drop in his systolic blood pressure of 20 mm from lying to standing position.  He does have a known history of orthostatic hypotension and is noted to be on midodrine.  Continue to monitor. Continue to hold diltiazem for now.   IDT2DM with diabetic peripheral neuropathy: HbA1c 9.3%.  -Continue SSI and discontinue long-acting insulin as patient is not eating very much - Continue lyrica   COPD/emphysema/tobacco abuse: - Continue bronchodilators - Continue nicotine patch Stable for the most part.  Hyperkalemia Administer Lokelma twice daily for now Kayexalate today to assist with hyperkalemia as well as with bowel movement   AKI-worsening on CKD stage IIIa, oliguric IV fluid discontinued per nephrology and 1 dose of IV Lasix ordered Appreciate nephrology evaluation Patient not interested in hemodialysis and palliative following Renal ultrasound with previous right nephrectomy and left side with no obstruction Document strict I's and O's Likely transition to comfort care by a.m. if still worsening due to no desire to have HD   Aortic mural thrombus:  As seen on CTA chest. Studies do not show a change in embolization risk with chronic anticoagulation and higher risk of destabilizing clot with thrombolysis. We are continuing plavix.   Normocytic anemia Likely has anemia of chronic disease.  Some drop  in hemoglobin is noted during this hospital stay.  Hemoglobin stable.   Chronic hypoxic respiratory failure, ILD On 6L O2, says he's been on this since 2015.  - At baseline.    OSA:  - CPAP qHS    Anxiety:  - Continue alprazolam and prozac.  Noted be on scheduled alprazolam.  Has been somnolent so we will cut back on the dose today.   GERD:  - PPI   Moderate protein calorie malnutrition:  - Supp protein   s/p left shoulder hemiplasty/reduction of chronic dislocation 6/26 by Dr. Everardo Pacific:  - Continue pain control, PT   Debility: Pt has marked weakness diffusely - PT/OT consulted > SNF rehabilitation being pursued.     DVT prophylaxis:Heparin Code Status: DNR Family Communication: Discussed with brother [name] 8/9; tried calling 8/11 with no response Disposition Plan: SNF, anticipate likely transition to hospice/comfort care soon Status is: Inpatient Remains inpatient appropriate because: Need for IV fluid and medications.   Skin Assessment:  I have examined the patient's skin and I agree with the wound assessment as performed by the wound care RN as outlined below:  Pressure Injury 02/22/23 Sacrum Medial Stage 1 -  Intact skin with non-blanchable redness of a localized area usually over a bony prominence. (Active)  02/22/23 1019  Location: Sacrum  Location Orientation: Medial  Staging: Stage 1 -  Intact skin with non-blanchable redness of a localized area usually over a bony prominence.  Wound Description (Comments):   Present on Admission:   Dressing Type Foam - Lift dressing to assess site every shift 02/25/23 0853    Consultants:  Nephrology Palliative  Procedures:  None  Antimicrobials:  Anti-infectives (From admission, onward)    Start     Dose/Rate Route Frequency Ordered Stop   02/24/23 1200  doxycycline (VIBRA-TABS) tablet 100 mg        100 mg Oral Every 12 hours 02/24/23 1009     02/24/23 0817  vancomycin variable dose per unstable renal function (pharmacist dosing)  Status:  Discontinued         Does not apply See admin instructions 02/24/23 0817 02/24/23 1009   02/21/23 1400  vancomycin (VANCOCIN) IVPB 1000 mg/200 mL premix  Status:  Discontinued         1,000 mg 200 mL/hr over 60 Minutes Intravenous Every 24 hours 02/20/23 1342 02/24/23 0817   02/20/23 1430  vancomycin (VANCOREADY) IVPB 2000 mg/400 mL        2,000 mg 200 mL/hr over 120 Minutes Intravenous  Once 02/20/23 1342 02/20/23 1739   02/16/23 1200  cefTRIAXone (ROCEPHIN) 1 g in sodium chloride 0.9 % 100 mL IVPB        1 g 200 mL/hr over 30 Minutes Intravenous Every 24 hours 02/15/23 1954 02/22/23 1325   02/15/23 1215  cefTRIAXone (ROCEPHIN) 1 g in sodium chloride 0.9 % 100 mL IVPB        1 g 200 mL/hr over 30 Minutes Intravenous  Once 02/15/23 1200 02/15/23 1238      Subjective: Patient seen and evaluated today and states that he overall does not feel well and understands that his renal function is worsening.  Potassium continues to remain elevated.  Discussion had regarding the fact that once he transitions to comfort care he may only have days to survive given his worsening renal function.  He understands and states that he will reach out to friends and family members prior to transition anticipated in the next 1-2 days.  Objective: Vitals:  02/27/23 1312 02/27/23 2006 02/27/23 2122 02/28/23 0412  BP: 103/64  117/72 136/77  Pulse: 84  85 86  Resp:   (!) 22 20  Temp: 98.5 F (36.9 C)  97.8 F (36.6 C) 98.2 F (36.8 C)  TempSrc: Oral  Oral Oral  SpO2: 96% 95% 98% 100%  Weight:      Height:        Intake/Output Summary (Last 24 hours) at 02/28/2023 0931 Last data filed at 02/27/2023 1832 Gross per 24 hour  Intake 360 ml  Output --  Net 360 ml   Filed Weights   02/15/23 1142 02/15/23 1900  Weight: 90.7 kg 91.9 kg    Examination:  General exam: Appears calm and comfortable  Respiratory system: Clear to auscultation. Respiratory effort normal.  5 L nasal cannula at baseline. Cardiovascular system: S1 & S2 heard, RRR.  Gastrointestinal system: Abdomen is soft Central nervous system: Alert and awake Extremities: Right foot erythema Skin: No significant  lesions noted Psychiatry: Flat affect.    Data Reviewed: I have personally reviewed following labs and imaging studies  CBC: Recent Labs  Lab 02/24/23 0510 02/25/23 0442 02/26/23 0432 02/27/23 0526 02/28/23 0544  WBC 13.4* 13.5* 11.9* 10.6* 9.3  HGB 9.3* 9.1* 9.1* 9.7* 9.7*  HCT 29.3* 28.3* 28.6* 31.3* 30.6*  MCV 83.5 83.5 83.1 83.2 82.5  PLT 245 224 205 216 229   Basic Metabolic Panel: Recent Labs  Lab 02/24/23 0510 02/25/23 0442 02/25/23 1327 02/26/23 0432 02/27/23 0526 02/27/23 1531 02/28/23 0544  NA 130* 130*  --  129* 131*  --  129*  K 5.0 5.3* 5.6* 5.0 5.5* 5.8* 5.4*  CL 97* 99  --  99 99  --  99  CO2 24 22  --  19* 19*  --  20*  GLUCOSE 114* 119*  --  98 91  --  103*  BUN 33* 43*  --  54* 67*  --  81*  CREATININE 2.51* 3.27*  --  3.87* 4.50*  --  4.96*  CALCIUM 8.0* 7.9*  --  7.8* 8.1*  --  8.0*  MG  --  1.3*  --  1.6* 1.8  --  1.8   GFR: Estimated Creatinine Clearance: 16.2 mL/min (A) (by C-G formula based on SCr of 4.96 mg/dL (H)). Liver Function Tests: Recent Labs  Lab 02/21/23 1031  AST 12*  ALT 14  ALKPHOS 82  BILITOT 0.3  PROT 7.0  ALBUMIN 2.4*   No results for input(s): "LIPASE", "AMYLASE" in the last 168 hours. No results for input(s): "AMMONIA" in the last 168 hours. Coagulation Profile: No results for input(s): "INR", "PROTIME" in the last 168 hours. Cardiac Enzymes: No results for input(s): "CKTOTAL", "CKMB", "CKMBINDEX", "TROPONINI" in the last 168 hours. BNP (last 3 results) Recent Labs    09/25/22 1412  PROBNP 54.0   HbA1C: No results for input(s): "HGBA1C" in the last 72 hours. CBG: Recent Labs  Lab 02/27/23 0810 02/27/23 1150 02/27/23 1702 02/27/23 2118 02/28/23 0741  GLUCAP 76 82 77 136* 107*   Lipid Profile: No results for input(s): "CHOL", "HDL", "LDLCALC", "TRIG", "CHOLHDL", "LDLDIRECT" in the last 72 hours. Thyroid Function Tests: No results for input(s): "TSH", "T4TOTAL", "FREET4", "T3FREE", "THYROIDAB" in  the last 72 hours. Anemia Panel: Recent Labs    02/25/23 1327  FERRITIN 261  TIBC 172*  IRON 22*   Sepsis Labs: Recent Labs  Lab 02/22/23 0441  PROCALCITON 0.13    Recent Results (from the past 240 hour(s))  Culture, blood (single) w Reflex to ID Panel     Status: None   Collection Time: 02/19/23 10:52 PM   Specimen: Right Antecubital; Blood  Result Value Ref Range Status   Specimen Description RIGHT ANTECUBITAL  Final   Special Requests   Final    BOTTLES DRAWN AEROBIC AND ANAEROBIC Blood Culture results may not be optimal due to an excessive volume of blood received in culture bottles   Culture   Final    NO GROWTH 5 DAYS Performed at [facility], [address] [place], [place] [address]    Report Status 02/24/2023 FINAL  Final         Radiology Studies: No results found.      Scheduled Meds:  atorvastatin  20 mg Oral QHS   clopidogrel  75 mg Oral Daily   dextromethorphan-guaiFENesin  1 tablet Oral BID   doxycycline  100 mg Oral Q12H   FLUoxetine  40 mg Oral Daily   heparin  5,000 Units Subcutaneous Q8H   insulin aspart  0-5 Units Subcutaneous QHS   insulin aspart  0-9 Units Subcutaneous TID WC   ipratropium-albuterol  3 mL Nebulization BID   iron polysaccharides  150 mg Oral Daily   midodrine  2.5 mg Oral BID   multivitamin with minerals  1 tablet Oral Daily   nicotine  21 mg Transdermal Daily   pantoprazole  40 mg Oral Daily   ranolazine  500 mg Oral BID   sodium polystyrene  30 g Rectal Once   sodium zirconium cyclosilicate  10 g Oral BID   tamsulosin  0.4 mg Oral QPC supper      LOS: 13 days    Time spent: 35 minutes     Hoover Brunette, DO [facility]  If 7PM-7AM, please contact night-coverage [email or web address] 02/28/2023, 9:31 AM

## 2023-03-01 DIAGNOSIS — A419 Sepsis, unspecified organism: Secondary | ICD-10-CM | POA: Diagnosis not present

## 2023-03-01 DIAGNOSIS — L0390 Cellulitis, unspecified: Secondary | ICD-10-CM | POA: Diagnosis not present

## 2023-03-01 LAB — GLUCOSE, CAPILLARY
Glucose-Capillary: 84 mg/dL (ref 70–99)
Glucose-Capillary: 97 mg/dL (ref 70–99)
Glucose-Capillary: 88 mg/dL (ref 70–99)
Glucose-Capillary: 126 mg/dL — ABNORMAL HIGH (ref 70–99)

## 2023-03-01 MED ORDER — SODIUM BICARBONATE 650 MG PO TABS
650.0000 mg | ORAL_TABLET | Freq: Three times a day (TID) | ORAL | Status: DC
  Administered 2023-03-01 – 2023-03-02 (×4): 650 mg via ORAL
  Filled 2023-03-01 (×4): qty 1

## 2023-03-01 NOTE — Progress Notes (Signed)
   03/01/23 0045  Vitals  Temp 97.9 F (36.6 C)  Temp Source Oral  BP (!) 151/71  MAP (mmHg) 95  BP Location Right Arm  BP Method Automatic  Patient Position (if appropriate) Lying  Pulse Rate 90  Pulse Rate Source Dinamap  MEWS COLOR  MEWS Score Color Green  Oxygen Therapy  SpO2 94 %  O2 Device Nasal Cannula  O2 Flow Rate (L/min) 5 L/min  MEWS Score  MEWS Temp 0  MEWS Systolic 0  MEWS Pulse 0  MEWS RR 0  MEWS LOC 0  MEWS Score 0    Upon walking in patient room, patient noted to have his oxygen out of his nose. Patient c/o not being able to breathe. O2 placed back in patient nose. Vitals checked. Stable. Patient appeared to only be on 5L even though baseline is 6L. Bumped patient back up to 6. Patient requesting PRN Xanax at this time.

## 2023-03-01 NOTE — Progress Notes (Signed)
Mobility Specialist Progress Note:    03/01/23 1215  Mobility  Activity Ambulated with assistance to bathroom  Level of Assistance Moderate assist, patient does 50-74%  Assistive Device Front wheel walker  Distance Ambulated (ft) 10 ft  Range of Motion/Exercises Active;All extremities  LUE Weight Bearing WBAT  Activity Response Tolerated well  Mobility Referral Yes  $Mobility charge 1 Mobility  Mobility Specialist Start Time (ACUTE ONLY) 1215  Mobility Specialist Stop Time (ACUTE ONLY) 1225  Mobility Specialist Time Calculation (min) (ACUTE ONLY) 10 min   Pt received in bed, agreeable to mobility. Required Mod A to stand, CGA ambulate to bathroom with RW. Orthostatics performed, see below. Tolerated session well, c/o "blurry vision" and dizziness during session, but recovered. SpO2 96% on 6L throughout session, denies SOB. Left pt in chair for lunch, alarm on, call bell in reach, all needs met. Nurse notified.   Supine: 141/92 (107) Sitting EOB: 131/81 (96) Standing 1 min: 124/ 82 (85) Post ambulation/ Standing 3 min: 138/89 (102)    Mobility Specialist Please contact via Special educational needs teacher or  Rehab office at [phone]

## 2023-03-01 NOTE — Plan of Care (Signed)
  Problem: Education: Goal: Knowledge of General Education information will improve Description: Including pain rating scale, medication(s)/side effects and non-pharmacologic comfort measures Outcome: Progressing   Problem: Clinical Measurements: Goal: Diagnostic test results will improve Outcome: Progressing   Problem: Coping: Goal: Level of anxiety will decrease Outcome: Progressing   Problem: Elimination: Goal: Will not experience complications related to bowel motility Outcome: Progressing   Problem: Safety: Goal: Ability to remain free from injury will improve Outcome: Progressing

## 2023-03-01 NOTE — Progress Notes (Signed)
Patient ID: [name], male   DOB: 22-Jan-1956, 67 y.o.   MRN: [id number] S: No events overnight and he thinks he made some urine. O:BP (!) 171/80   Pulse (!) 51   Temp 97.7 F (36.5 C) (Oral)   Resp 18   Ht 5\' 9"  (1.753 m)   Wt 91.9 kg   SpO2 93%   BMI 29.92 kg/m   Intake/Output Summary (Last 24 hours) at 03/01/2023 0837 Last data filed at 02/28/2023 1931 Gross per 24 hour  Intake 480 ml  Output --  Net 480 ml   Intake/Output: I/O last 3 completed shifts: In: 480 [P.O.:480] Out: -   Intake/Output this shift:  No intake/output data recorded. Weight change:  Gen: NAD CVS: bradycardic at 51 Resp:CTA Abd: +BS, soft, NT/ND Ext: no edema, erythema of right foot.  Recent Labs  Lab 02/24/23 0510 02/25/23 0442 02/25/23 1327 02/26/23 0432 02/27/23 0526 02/27/23 1531 02/28/23 0544 03/01/23 0335  NA 130* 130*  --  129* 131*  --  129* 130*  K 5.0 5.3* 5.6* 5.0 5.5* 5.8* 5.4* 4.5  CL 97* 99  --  99 99  --  99 97*  CO2 24 22  --  19* 19*  --  20* 19*  GLUCOSE 114* 119*  --  98 91  --  103* 95  BUN 33* 43*  --  54* 67*  --  81* 84*  CREATININE 2.51* 3.27*  --  3.87* 4.50*  --  4.96* 4.92*  CALCIUM 8.0* 7.9*  --  7.8* 8.1*  --  8.0* 7.8*   Liver Function Tests: No results for input(s): "AST", "ALT", "ALKPHOS", "BILITOT", "PROT", "ALBUMIN" in the last 168 hours. No results for input(s): "LIPASE", "AMYLASE" in the last 168 hours. No results for input(s): "AMMONIA" in the last 168 hours. CBC: Recent Labs  Lab 02/25/23 0442 02/26/23 0432 02/27/23 0526 02/28/23 0544 03/01/23 0335  WBC 13.5* 11.9* 10.6* 9.3 8.4  HGB 9.1* 9.1* 9.7* 9.7* 9.2*  HCT 28.3* 28.6* 31.3* 30.6* 28.7*  MCV 83.5 83.1 83.2 82.5 81.5  PLT 224 205 216 229 237   Cardiac Enzymes: No results for input(s): "CKTOTAL", "CKMB", "CKMBINDEX", "TROPONINI" in the last 168 hours. CBG: Recent Labs  Lab 02/28/23 0741 02/28/23 1136 02/28/23 1707 02/28/23 2015 03/01/23 0750  GLUCAP 107* 136* 107* 127* 84     Iron Studies: No results for input(s): "IRON", "TIBC", "TRANSFERRIN", "FERRITIN" in the last 72 hours. Studies/Results: No results found.  atorvastatin  20 mg Oral QHS   clopidogrel  75 mg Oral Daily   dextromethorphan-guaiFENesin  1 tablet Oral BID   doxycycline  100 mg Oral Q12H   FLUoxetine  40 mg Oral Daily   heparin  5,000 Units Subcutaneous Q8H   insulin aspart  0-5 Units Subcutaneous QHS   insulin aspart  0-9 Units Subcutaneous TID WC   ipratropium-albuterol  3 mL Nebulization BID   iron polysaccharides  150 mg Oral Daily   midodrine  2.5 mg Oral BID   multivitamin with minerals  1 tablet Oral Daily   nicotine  21 mg Transdermal Daily   pantoprazole  40 mg Oral Daily   ranolazine  500 mg Oral BID   sodium bicarbonate  650 mg Oral TID   sodium zirconium cyclosilicate  10 g Oral BID   tamsulosin  0.4 mg Oral QPC supper    BMET    Component Value Date/Time   NA 130 (L) 03/01/2023 0335   K 4.5 03/01/2023  0335   CL 97 (L) 03/01/2023 0335   CO2 19 (L) 03/01/2023 0335   GLUCOSE 95 03/01/2023 0335   BUN 84 (H) 03/01/2023 0335   CREATININE 4.92 (H) 03/01/2023 0335   CREATININE 1.89 (H) 04/24/2021 1524   CALCIUM 7.8 (L) 03/01/2023 0335   GFRNONAA 12 (L) 03/01/2023 0335   GFRAA 42 (L) 04/12/2020 1348   CBC    Component Value Date/Time   WBC 8.4 03/01/2023 0335   RBC 3.52 (L) 03/01/2023 0335   HGB 9.2 (L) 03/01/2023 0335   HCT 28.7 (L) 03/01/2023 0335   PLT 237 03/01/2023 0335   MCV 81.5 03/01/2023 0335   MCH 26.1 03/01/2023 0335   MCHC 32.1 03/01/2023 0335   RDW 14.6 03/01/2023 0335   LYMPHSABS 1.2 02/16/2023 0431   MONOABS 0.7 02/16/2023 0431   EOSABS 0.1 02/16/2023 0431   BASOSABS 0.1 02/16/2023 0431     Assessment/Plan:  AKI/CKD stage IIIa - likely ischemic ATN in setting of sepsis and diuretic use.  Likely was supra-therapeutic vanc as well.  BUN/Cr appear to have reached a plateau.  He denies any nausea but is not eating.  He does not want dialysis  and palliative care is following.  Continue to watch for another day and if no improvement, transition to hospice. Hyperkalemia - improved with Lokelma.  Agree with sodium bicarb. Cellulitis of right foot - abx per primary team. HTN - stable for now, has history of orthostatic hypotension on midodrine at home. DM type 2 - per priamry COPD- per primary. FTT - palliative care following.  Irena Cords, MD [facility]

## 2023-03-01 NOTE — Progress Notes (Signed)
Physical Therapy Treatment Patient Details Name: [name] MRN: [id number] DOB: [birth year] Today's Date: 03/01/2023   History of Present Illness [name] is a 67 y.o. 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  Patient reports he had diarrhea at least 5 times last 5 hours.  He actually has an incontinent episode during my exam.  He denies any hematochezia or melena.  Patient reports he has not felt feverish although he did have a fever when he came into the ER.  He has had chest pain.  He describes his pain in the center of his chest and radiating left.  It feels like a heaviness.  It feels similar to when he had a MI in the past.  His troponin in the ER is actually downtrending from 20-17.  Patient reports he has not felt any increase in shortness of breath.  He is on 6 L nasal cannula at baseline.  He has not had any dizziness, malaise, fatigue.  He denies any cough.  Patient does report he has had urinary retention.  He has not had any urine output for the entire day.  He reports prior to that he was not having any dysuria.  Patient reports that yesterday his neighbor noticed that his right lower extremity was swollen.  Patient reports that it had felt like needles and cold.  He attributed it to his neuropathy.  It got worse throughout the night.  The bottom of the foot started hurting badly.  Patient reports that this was not normal for him.  He did not consider that it may be infected.  Patient reports that he takes Lyrica at home and has not had a "neuropathy flare" in quite some time.  His Lyrica was not helping last night.  In regards to patient's diarrhea, patient reports not  having been on any antibiotics recently.  He has had no abdominal pain.  Nobody else around him is having diarrhea.  Patient has no other complaints at this time.    PT Comments  Pt presented to therapy in chair. Stated he did not feel very well prior to start of session. Able to perform LE strengthening exercises seated in chair and required mod assist to stand. Participation in ambulation limited due to reports of worsening dizziness. Steps taken from chair to bed were slow, labored cadence with RW and short shuffling. Pt requested to lay back in bed at end of session. Patient will benefit from continued skilled physical therapy in hospital and recommended venue below to increase strength, balance, endurance for safe ADLs and gait.    If plan is discharge home, recommend the following: A lot of help with walking and/or transfers;A lot of help with bathing/dressing/bathroom;Assistance with cooking/housework;Assist for transportation;Help with stairs or ramp for entrance   Can travel by private vehicle     Yes  Equipment Recommendations       Recommendations for Other Services       Precautions / Restrictions Precautions Precautions: Fall Restrictions Weight Bearing Restrictions: No LUE Weight Bearing: Weight bearing as tolerated     Mobility  Bed Mobility Overal bed mobility: Needs Assistance Bed Mobility: Supine to Sit     Supine to sit: Min assist, HOB elevated  General bed mobility comments: Assist for trunk elevation    Transfers Overall transfer level: Needs assistance Equipment used: Rolling walker (2 wheels) Transfers: Sit to/from Stand Sit to Stand: Mod assist   Step pivot transfers: Min assist       General transfer comment: use of RW    Ambulation/Gait Ambulation/Gait assistance: Min assist Gait Distance (Feet): 6 Feet (Side steps taken from chair to bed) Assistive device: Rolling walker (2 wheels) Gait Pattern/deviations: Step-to pattern, Decreased  step length - left, Decreased step length - right, Decreased stride length, Wide base of support Gait velocity: decreased     General Gait Details: slow, labored cadence with RW limited to short shuffling from chair to bed due to pt's dizziness and complaints of fatigue.   Stairs             Wheelchair Mobility     Tilt Bed    Modified Rankin (Stroke Patients Only)       Balance Overall balance assessment: Needs assistance Sitting-balance support: Feet supported, Bilateral upper extremity supported Sitting balance-Leahy Scale: Fair Sitting balance - Comments: seated at EOB   Standing balance support: Bilateral upper extremity supported, During functional activity, Reliant on assistive device for balance Standing balance-Leahy Scale: Poor Standing balance comment: poor to fair  with RW                            Cognition Arousal: Alert Behavior During Therapy: WFL for tasks assessed/performed Overall Cognitive Status: Within Functional Limits for tasks assessed                                          Exercises General Exercises - Lower Extremity Ankle Circles/Pumps: AROM, Seated, Strengthening, Right, Left, 10 reps Long Arc Quad: AROM, Seated, Strengthening, Right, Left, 10 reps Hip Flexion/Marching: AROM, Strengthening, 10 reps, Seated, Left, Right Toe Raises: AROM, Seated, Strengthening, Right, Left, 10 reps Heel Raises: AROM, Seated, Strengthening, Right, Left, 10 reps    General Comments        Pertinent Vitals/Pain Pain Assessment Pain Assessment: Faces Faces Pain Scale: Hurts a little bit Pain Location: LUE Pain Descriptors / Indicators: Discomfort Pain Intervention(s): Limited activity within patient's tolerance, Monitored during session    Home Living                          Prior Function            PT Goals (current goals can now be found in the care plan section) Acute Rehab PT Goals Patient  Stated Goal: Return home with family to assist PT Goal Formulation: With patient Time For Goal Achievement: 03/04/23 Potential to Achieve Goals: Fair Progress towards PT goals: Progressing toward goals    Frequency    Min 3X/week      PT Plan Current plan remains appropriate    Co-evaluation              AM-PAC PT "6 Clicks" Mobility   Outcome Measure  Help needed turning from your back to your side while in a flat bed without using bedrails?: A Little Help needed moving from lying on your back to sitting on the side of a flat bed without using bedrails?: A Little Help needed moving to and from a bed to a chair (including a wheelchair)?:  A Little Help needed standing up from a chair using your arms (e.g., wheelchair or bedside chair)?: A Lot Help needed to walk in hospital room?: A Lot Help needed climbing 3-5 steps with a railing? : Total 6 Click Score: 14    End of Session Equipment Utilized During Treatment: Gait belt Activity Tolerance: Patient limited by fatigue;Other (comment) (Patient limited by dizziness) Patient left: with call bell/phone within reach;in bed Nurse Communication: Mobility status PT Visit Diagnosis: Unsteadiness on feet (R26.81);Other abnormalities of gait and mobility (R26.89);Muscle weakness (generalized) (M62.81)     Time: 1520-1540 PT Time Calculation (min) (ACUTE ONLY): 20 min  Charges:    $Therapeutic Exercise: 8-22 mins $Therapeutic Activity: 8-22 mins PT General Charges $$ ACUTE PT VISIT: 1 Visit                       [facility] [place], DPT Program

## 2023-03-01 NOTE — Progress Notes (Signed)
PROGRESS NOTE    [name]  ZOX:[id number] DOB: 17-Jul-1956 DOA: 02/15/2023 PCP: Genia Hotter, FNP   Brief Narrative:    67 y.o. 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 The patient's pain has not improved, recurrently febrile, so vancomycin is added. Blood culture repeated with no growth to date.  He is now noted to have worsening AKI and nephrology consultation pending.  Palliative also following as he appears to be hospice appropriate.  Assessment & Plan:   Principal Problem:   Sepsis due to cellulitis [facility]) Active Problems:   Hypokalemia   Type 2 diabetes mellitus ([name])   Hyponatremia   Essential hypertension   Anxiety state   Mixed hyperlipidemia   Smoking   GERD without esophagitis   Peripheral neuropathy   Cellulitis in diabetic foot ([name])   Aortic mural thrombus ([name])   Malnutrition of moderate degree  Assessment and Plan:  Right foot cellulitis/sepsis, present on admission Patient was started on ceftriaxone.  Continue to have febrile episodes so vancomycin was added on 8/3. Blood cultures from 7/29 negative.  Blood cultures were repeated and negative so far. MRI of the right foot did not show any deep abscess.  Findings did suggest cellulitis.  No vascular compromise noted on physical examination. Foot actually has been improving the last 48 hours.  WBC remains normal.  Procalcitonin 0.13. Continue doxycycline   PAD Underwent ABI measurements during this hospital stay.  Right ABI 0.69 (moderate), Left ABI normal.  Continue atorvastatin, ranexa and plavix. Patient with prior transmetatarsal amputation of the left foot No obvious  vascular compromise noted on examination.  His cellulitis appears to be improving.  He can follow-up with vascular surgery in the outpatient setting.  If however his symptoms recur then will have low threshold to do a CT angiogram of right lower extremity.   Essential hypertension:  His diltiazem was held.  Continuing just nitrates for now.   Orthostatics were checked yesterday and he did have drop in his systolic blood pressure of 20 mm from lying to standing position.  He does have a known history of orthostatic hypotension and is noted to be on midodrine.  Continue to monitor. Continue to hold diltiazem for now.   IDT2DM with diabetic peripheral neuropathy: HbA1c 9.3%.  -Continue SSI and discontinue long-acting insulin as patient is not eating very much - Continue lyrica   COPD/emphysema/tobacco abuse: - Continue bronchodilators - Continue nicotine patch Stable for the most part.  Hyperkalemia Administer Lokelma twice daily for now Kayexalate today to assist with hyperkalemia as well as with bowel movement   AKI-worsening on CKD stage IIIa, oliguric IV fluid discontinued per nephrology and 1 dose of IV Lasix ordered Appreciate nephrology evaluation Patient not interested in hemodialysis and palliative following Renal ultrasound with previous right nephrectomy and left side with no obstruction Document strict I's and O's Likely transition to comfort care by a.m. if still worsening due to no desire to have HD-nephrology recommended following labs for another 24-48 hours   Aortic mural thrombus:  As seen on CTA chest. Studies do not show a change in embolization risk with chronic anticoagulation and higher risk of destabilizing clot with thrombolysis. We are continuing plavix.   Normocytic anemia Likely has  anemia of chronic disease.  Some drop in hemoglobin is noted during this hospital stay.  Hemoglobin stable.   Chronic hypoxic respiratory failure, ILD On 6L O2, says he's been  on this since 2015.  - At baseline.    OSA:  - CPAP qHS   Anxiety:  - Continue alprazolam and prozac.  Noted be on scheduled alprazolam.  Has been somnolent so we will cut back on the dose today.   GERD:  - PPI   Moderate protein calorie malnutrition:  - Supp protein   s/p left shoulder hemiplasty/reduction of chronic dislocation 6/26 by Dr. Everardo Pacific:  - Continue pain control, PT   Debility: Pt has marked weakness diffusely - PT/OT consulted > SNF rehabilitation being pursued.     DVT prophylaxis:Heparin Code Status: DNR Family Communication: Discussed with brother [name] 8/11 Disposition Plan: SNF, anticipate likely transition to hospice/comfort care soon Status is: Inpatient Remains inpatient appropriate because: Need for IV fluid and medications.   Skin Assessment:  I have examined the patient's skin and I agree with the wound assessment as performed by the wound care RN as outlined below:  Pressure Injury 02/22/23 Sacrum Medial Stage 1 -  Intact skin with non-blanchable redness of a localized area usually over a bony prominence. (Active)  02/22/23 1019  Location: Sacrum  Location Orientation: Medial  Staging: Stage 1 -  Intact skin with non-blanchable redness of a localized area usually over a bony prominence.  Wound Description (Comments):   Present on Admission:   Dressing Type Foam - Lift dressing to assess site every shift 02/25/23 0853    Consultants:  Nephrology Palliative  Procedures:  None  Antimicrobials:  Anti-infectives (From admission, onward)    Start     Dose/Rate Route Frequency Ordered Stop   02/24/23 1200  doxycycline (VIBRA-TABS) tablet 100 mg        100 mg Oral Every 12 hours 02/24/23 1009     02/24/23 0817  vancomycin variable dose per unstable renal function (pharmacist dosing)  Status:  Discontinued         Does not apply See admin instructions 02/24/23 0817 02/24/23 1009   02/21/23 1400  vancomycin (VANCOCIN) IVPB 1000 mg/200 mL  premix  Status:  Discontinued        1,000 mg 200 mL/hr over 60 Minutes Intravenous Every 24 hours 02/20/23 1342 02/24/23 0817   02/20/23 1430  vancomycin (VANCOREADY) IVPB 2000 mg/400 mL        2,000 mg 200 mL/hr over 120 Minutes Intravenous  Once 02/20/23 1342 02/20/23 1739   02/16/23 1200  cefTRIAXone (ROCEPHIN) 1 g in sodium chloride 0.9 % 100 mL IVPB        1 g 200 mL/hr over 30 Minutes Intravenous Every 24 hours 02/15/23 1954 02/22/23 1325   02/15/23 1215  cefTRIAXone (ROCEPHIN) 1 g in sodium chloride 0.9 % 100 mL IVPB        1 g 200 mL/hr over 30 Minutes Intravenous  Once 02/15/23 1200 02/15/23 1238      Subjective: Patient seen and evaluated today and states that he is feeling okay, but denies any appetite and has not been eating.  He appears to think that he is making some urine, but is ready for transition to comfort care when necessary.  Objective: Vitals:   02/28/23 2036 03/01/23 0045 03/01/23 0451 03/01/23 0731  BP: (!) 180/90 (!) 151/71 (!) 171/80   Pulse: 91 90 89 (!) 51  Resp:   18 18  Temp:  98 F (36.7 C) 97.9 F (36.6 C) 97.7 F (36.5 C)   TempSrc: Oral Oral Oral   SpO2: 97% 94% 95% 93%  Weight:      Height:        Intake/Output Summary (Last 24 hours) at 03/01/2023 1139 Last data filed at 02/28/2023 1931 Gross per 24 hour  Intake 360 ml  Output --  Net 360 ml   Filed Weights   02/15/23 1142 02/15/23 1900  Weight: 90.7 kg 91.9 kg    Examination:  General exam: Appears calm and comfortable  Respiratory system: Clear to auscultation. Respiratory effort normal.  5 L nasal cannula at baseline. Cardiovascular system: S1 & S2 heard, RRR.  Gastrointestinal system: Abdomen is soft Central nervous system: Alert and awake Extremities: Right foot erythema Skin: No significant lesions noted Psychiatry: Flat affect.    Data Reviewed: I have personally reviewed following labs and imaging studies  CBC: Recent Labs  Lab 02/25/23 0442 02/26/23 0432  02/27/23 0526 02/28/23 0544 03/01/23 0335  WBC 13.5* 11.9* 10.6* 9.3 8.4  HGB 9.1* 9.1* 9.7* 9.7* 9.2*  HCT 28.3* 28.6* 31.3* 30.6* 28.7*  MCV 83.5 83.1 83.2 82.5 81.5  PLT 224 205 216 229 237   Basic Metabolic Panel: Recent Labs  Lab 02/25/23 0442 02/25/23 1327 02/26/23 0432 02/27/23 0526 02/27/23 1531 02/28/23 0544 03/01/23 0335  NA 130*  --  129* 131*  --  129* 130*  K 5.3*   < > 5.0 5.5* 5.8* 5.4* 4.5  CL 99  --  99 99  --  99 97*  CO2 22  --  19* 19*  --  20* 19*  GLUCOSE 119*  --  98 91  --  103* 95  BUN 43*  --  54* 67*  --  81* 84*  CREATININE 3.27*  --  3.87* 4.50*  --  4.96* 4.92*  CALCIUM 7.9*  --  7.8* 8.1*  --  8.0* 7.8*  MG 1.3*  --  1.6* 1.8  --  1.8 1.8   < > = values in this interval not displayed.   GFR: Estimated Creatinine Clearance: 16.3 mL/min (A) (by C-G formula based on SCr of 4.92 mg/dL (H)). Liver Function Tests: No results for input(s): "AST", "ALT", "ALKPHOS", "BILITOT", "PROT", "ALBUMIN" in the last 168 hours.  No results for input(s): "LIPASE", "AMYLASE" in the last 168 hours. No results for input(s): "AMMONIA" in the last 168 hours. Coagulation Profile: No results for input(s): "INR", "PROTIME" in the last 168 hours. Cardiac Enzymes: No results for input(s): "CKTOTAL", "CKMB", "CKMBINDEX", "TROPONINI" in the last 168 hours. BNP (last 3 results) Recent Labs    09/25/22 1412  PROBNP 54.0   HbA1C: No results for input(s): "HGBA1C" in the last 72 hours. CBG: Recent Labs  Lab 02/28/23 0741 02/28/23 1136 02/28/23 1707 02/28/23 2015 03/01/23 0750  GLUCAP 107* 136* 107* 127* 84   Lipid Profile: No results for input(s): "CHOL", "HDL", "LDLCALC", "TRIG", "CHOLHDL", "LDLDIRECT" in the last 72 hours. Thyroid Function Tests: No results for input(s): "TSH", "T4TOTAL", "FREET4", "T3FREE", "THYROIDAB" in the last 72 hours. Anemia Panel: No results for input(s): "VITAMINB12", "FOLATE", "FERRITIN", "TIBC", "IRON", "RETICCTPCT" in the last  72 hours.  Sepsis Labs: No results for input(s): "PROCALCITON", "LATICACIDVEN" in the last 168 hours.   Recent Results (from the past 240 hour(s))  Culture, blood (single) w Reflex to ID Panel     Status: None   Collection Time: 02/19/23 10:52 PM   Specimen: Right Antecubital; Blood  Result Value Ref Range Status   Specimen Description RIGHT ANTECUBITAL  Final   Special Requests   Final    BOTTLES DRAWN AEROBIC AND ANAEROBIC Blood Culture results may not be optimal due to an excessive volume of blood received in culture bottles   Culture   Final    NO GROWTH 5 DAYS Performed at [facility], [address], [place], [place] [address]    Report Status 02/24/2023 FINAL  Final         Radiology Studies: No results found.      Scheduled Meds:  atorvastatin  20 mg Oral QHS   clopidogrel  75 mg Oral Daily   dextromethorphan-guaiFENesin  1 tablet Oral BID   doxycycline  100 mg Oral Q12H   FLUoxetine  40 mg Oral Daily   heparin  5,000 Units Subcutaneous Q8H   insulin aspart  0-5 Units Subcutaneous QHS   insulin aspart  0-9 Units Subcutaneous TID WC   ipratropium-albuterol  3 mL Nebulization BID   iron polysaccharides  150 mg Oral Daily   midodrine  2.5 mg Oral BID   multivitamin with minerals  1 tablet Oral Daily   nicotine  21 mg Transdermal Daily   pantoprazole  40 mg Oral Daily   ranolazine  500 mg Oral BID   sodium bicarbonate  650 mg Oral TID   sodium zirconium cyclosilicate  10 g Oral BID   tamsulosin  0.4 mg Oral QPC supper      LOS: 14 days    Time spent: 35 minutes     Hoover Brunette, DO [facility]  If 7PM-7AM, please contact night-coverage [email or web address] 03/01/2023, 11:39 AM

## 2023-03-02 DIAGNOSIS — Z515 Encounter for palliative care: Secondary | ICD-10-CM | POA: Diagnosis not present

## 2023-03-02 DIAGNOSIS — Z7189 Other specified counseling: Secondary | ICD-10-CM | POA: Diagnosis not present

## 2023-03-02 DIAGNOSIS — A419 Sepsis, unspecified organism: Secondary | ICD-10-CM | POA: Diagnosis not present

## 2023-03-02 DIAGNOSIS — L0390 Cellulitis, unspecified: Secondary | ICD-10-CM | POA: Diagnosis not present

## 2023-03-02 LAB — GLUCOSE, CAPILLARY
Glucose-Capillary: 104 mg/dL — ABNORMAL HIGH (ref 70–99)
Glucose-Capillary: 93 mg/dL (ref 70–99)
Glucose-Capillary: 95 mg/dL (ref 70–99)

## 2023-03-02 MED ORDER — LORAZEPAM 1 MG PO TABS: 1.0000 mg | ORAL_TABLET | ORAL | Status: DC | PRN

## 2023-03-02 MED ORDER — ENSURE ENLIVE PO LIQD
237.0000 mL | Freq: Three times a day (TID) | ORAL | Status: DC
  Administered 2023-03-02 – 2023-03-04 (×6): 237 mL via ORAL

## 2023-03-02 MED ORDER — HYDROMORPHONE HCL 1 MG/ML IJ SOLN
0.5000 mg | INTRAMUSCULAR | Status: DC | PRN
  Administered 2023-03-02 – 2023-03-03 (×3): 0.5 mg via INTRAVENOUS
  Filled 2023-03-02: qty 0.5

## 2023-03-02 MED ORDER — LORAZEPAM 2 MG/ML PO CONC: 1.0000 mg | ORAL | Status: DC | PRN

## 2023-03-02 MED ORDER — LORAZEPAM 2 MG/ML IJ SOLN
1.0000 mg | INTRAMUSCULAR | Status: DC | PRN
  Administered 2023-03-03 – 2023-03-04 (×2): 1 mg via INTRAVENOUS
  Filled 2023-03-02 (×2): qty 1

## 2023-03-02 MED ORDER — GLYCOPYRROLATE 0.2 MG/ML IJ SOLN: 0.2000 mg | INTRAMUSCULAR | Status: DC | PRN

## 2023-03-02 MED ORDER — GLYCOPYRROLATE 1 MG PO TABS: 1.0000 mg | ORAL_TABLET | ORAL | Status: DC | PRN

## 2023-03-02 NOTE — Plan of Care (Signed)
Patient slept most of night and vital signs were stable. Patient's right foot is red in color, and toes seem to have blisters while the pad of the foot is a darker purple. C/o pain in the right foot and was given PRN pain medication as indicated. Foot was cleansed with normal saline and left open to air. Second toe has an open wound with the skin underneath dark red.

## 2023-03-02 NOTE — Progress Notes (Addendum)
Nutrition Follow-up  DOCUMENTATION CODES:   Non-severe (moderate) malnutrition in context of chronic illness  INTERVENTION:   -Continue MVI with minerals daily -Liberalize diet to regular for widest variety of meal selections -Ensure Enlive po TID, each supplement provides 350 kcal and 20 grams of protein.   NUTRITION DIAGNOSIS:   Moderate Malnutrition related to chronic illness (COPD, T2DM, CAD) as evidenced by percent weight loss, moderate muscle depletion.  Ongoing  GOAL:   Patient will meet greater than or equal to 90% of their needs  Unmet  MONITOR:   PO intake, Supplement acceptance, Labs, Diet advancement, Weight trends  REASON FOR ASSESSMENT:   Malnutrition Screening Tool    ASSESSMENT:   Pt admitted with chest pain. Pt being worked up for presumed sepsis d/t cellulitis of RLE. PMH significant for anxiety, cocaine use, COPD, CAD, depression, HTN, GERD, T2DM, amputations of L toes, tobacco use.  Pt unavailable at time of visit. Attempted to speak with pt via call to hospital room phone, however, unable to reach.   Palliative care and nephrology following. Pt with worsening kidney function and he had made it clear that he would not want HD or other aggressive measures. Per MD notes, likely transition to comfort care soon.   Pt currently on a renal, carb modified diet. Pt is lethargic at times and not eating much. Noted meal completions 0-50%.   No new wt since admission.   Medications reviewed and include lokelma.   Labs reviewed: Na: 130, K WDL, CBGS: 88-127 (inpatient orders for glycemic control are 0-5 units insulin aspart daily at bedtime and 0-9 units insulin aspart TID with meals).    Diet Order:   Diet Order             Diet renal/carb modified with fluid restriction Diet-HS Snack? Nothing; Fluid restriction: Other (see comments); Room service appropriate? Yes; Fluid consistency: Thin  Diet effective now                   EDUCATION NEEDS:    Education needs have been addressed  Skin:  Skin Assessment: Skin Integrity Issues: Skin Integrity Issues:: Stage I Stage I: sacrum  Last BM:  03/01/23  Height:   Ht Readings from Last 1 Encounters:  02/15/23 5\' 9"  (1.753 m)    Weight:   Wt Readings from Last 1 Encounters:  02/15/23 91.9 kg   BMI:  Body mass index is 29.92 kg/m.  Estimated Nutritional Needs:   Kcal:  2000-2200  Protein:  100-115g  Fluid:  >/=2L    Levada Schilling, RD, LDN, CDCES Registered Dietitian II Certified Diabetes Care and Education Specialist Please refer to [facility] for RD and/or RD on-call/weekend/after hours pager

## 2023-03-02 NOTE — Progress Notes (Signed)
PROGRESS NOTE    [name]  YSA:[id number] DOB: [birth year] DOA: 02/15/2023 PCP: Genia Hotter, FNP   Brief Narrative:    67 y.o. 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 The patient's pain has not improved, recurrently febrile, so vancomycin is added. Blood culture repeated with no growth to date.  He is now noted to have worsening AKI and nephrology consultation pending.  Palliative also following as he appears to be hospice appropriate.  Transition to comfort care 8/13.  Assessment & Plan:   Principal Problem:   Sepsis due to cellulitis [facility]) Active Problems:   Hypokalemia   Type 2 diabetes mellitus ([name])   Hyponatremia   Essential hypertension   Anxiety state   Mixed hyperlipidemia   Smoking   GERD without esophagitis   Peripheral neuropathy   Cellulitis in diabetic foot ([name])   Aortic mural thrombus ([name])   Malnutrition of moderate degree  Assessment and Plan:  Right foot cellulitis/sepsis, present on admission Discontinue further treatments and transition to comfort care   PAD Underwent ABI measurements during this hospital stay.  Right ABI 0.69 (moderate), Left ABI normal.  Continue atorvastatin, ranexa and plavix. Patient with prior transmetatarsal amputation of the left foot No obvious vascular compromise noted on examination.  His cellulitis appears to be improving.  He can follow-up with vascular surgery in the outpatient setting.  If however his symptoms recur then will have low threshold to do a CT angiogram of right lower extremity.   Essential hypertension:  Discontinue further treatments   IDT2DM with diabetic peripheral  neuropathy: HbA1c 9.3%.  -Further treatments discontinued - Continue lyrica   COPD/emphysema/tobacco abuse: - Continue bronchodilators - Continue nicotine patch Stable for the most part.  Hyperkalemia No further monitoring or treatments   AKI-worsening on CKD stage IIIa, oliguric Discussed with nephrology, transition to comfort care   Aortic mural thrombus:  No further Plavix   Normocytic anemia Likely has anemia of chronic disease.  Some drop in hemoglobin is noted during this hospital stay.  Hemoglobin stable.   Chronic hypoxic respiratory failure, ILD On 6L O2, says he's been on this since 2015.  - At baseline.    OSA:  - CPAP qHS   Anxiety:  -Ativan as needed   GERD:  - PPI   Moderate protein calorie malnutrition:  - Supp protein   s/p left shoulder hemiplasty/reduction of chronic dislocation 6/26 by Dr. Everardo Pacific:  - Continue pain control, PT   Debility: Pt has marked weakness diffusely - PT/OT consulted > SNF rehabilitation initially being pursued, now a candidate for hospice facility.  Transitioned to comfort measures 8/13    DVT prophylaxis: None Code Status: DNR Family Communication: Discussed with brother [name] 8/13 on phone Disposition Plan: SNF, anticipate likely transition to hospice/comfort care soon Status is: Inpatient Remains inpatient appropriate because: Need for IV fluid and medications.   Skin Assessment:  I have examined the patient's skin and I agree with the wound assessment as performed by the wound care RN as outlined below:  Pressure Injury 02/22/23 Sacrum Medial Stage 1 -  Intact skin with non-blanchable redness of a localized area usually over a bony prominence. (Active)  02/22/23 1019  Location: Sacrum  Location Orientation: Medial  Staging: Stage 1 -  Intact skin with non-blanchable redness of a localized area usually over a bony prominence.  Wound Description (Comments):   Present on Admission:   Dressing Type Foam - Lift  dressing to assess site every shift 02/25/23 0853    Consultants:  Nephrology Palliative  Procedures:  None  Antimicrobials:  Anti-infectives (From admission, onward)    Start     Dose/Rate Route Frequency Ordered Stop   02/24/23 1200  doxycycline (VIBRA-TABS) tablet 100 mg  Status:  Discontinued        100 mg Oral Every 12 hours 02/24/23 1009 03/02/23 1246   02/24/23 0817  vancomycin variable dose per unstable renal function (pharmacist dosing)  Status:  Discontinued         Does not apply See admin instructions 02/24/23 0817 02/24/23 1009   02/21/23 1400  vancomycin (VANCOCIN) IVPB 1000 mg/200 mL premix  Status:  Discontinued        1,000 mg 200 mL/hr over 60 Minutes Intravenous Every 24 hours 02/20/23 1342 02/24/23 0817   02/20/23 1430  vancomycin (VANCOREADY) IVPB 2000 mg/400 mL        2,000 mg 200 mL/hr over 120 Minutes Intravenous  Once 02/20/23 1342 02/20/23 1739   02/16/23 1200  cefTRIAXone (ROCEPHIN) 1 g in sodium chloride 0.9 % 100 mL IVPB        1 g 200 mL/hr over 30 Minutes Intravenous Every 24 hours 02/15/23 1954 02/22/23 1325   02/15/23 1215  cefTRIAXone (ROCEPHIN) 1 g in sodium chloride 0.9 % 100 mL IVPB        1 g 200 mL/hr over 30 Minutes Intravenous  Once 02/15/23 1200 02/15/23 1238      Subjective: Patient seen and evaluated today and states that he is feeling okay, but denies any appetite and states he is barely eating.  He is ready for transition to comfort care today.  Objective: Vitals:   03/01/23 1945 03/01/23 2008 03/02/23 0413 03/02/23 0754  BP:  (!) 130/92 99/77   Pulse:  86 82   Resp:  20    Temp:  98 F (36.7 C) 98.6 F (37 C)   TempSrc:   Oral   SpO2: 90% 97% 98% 97%  Weight:      Height:       No intake or output data in the 24 hours ending 03/02/23 1248  Filed Weights   02/15/23 1142 02/15/23 1900  Weight: 90.7 kg 91.9 kg    Examination:  General exam: Appears calm and comfortable  Respiratory system: Clear to auscultation.  Respiratory effort normal.  5 L nasal cannula at baseline. Cardiovascular system: S1 & S2 heard, RRR.  Gastrointestinal system: Abdomen is soft Central nervous system: Alert and awake Extremities: Right foot erythema Skin: No significant lesions noted Psychiatry: Flat affect.    Data Reviewed: I have personally reviewed following labs and imaging studies  CBC: Recent Labs  Lab 02/25/23 0442 02/26/23 0432 02/27/23 0526 02/28/23 0544 03/01/23 0335  WBC 13.5* 11.9* 10.6* 9.3 8.4  HGB 9.1* 9.1* 9.7* 9.7* 9.2*  HCT 28.3* 28.6* 31.3* 30.6* 28.7*  MCV 83.5 83.1 83.2 82.5 81.5  PLT 224 205 216 229 237   Basic Metabolic Panel: Recent Labs  Lab 02/26/23 0432 02/27/23 0526 02/27/23 1531 02/28/23 0544 03/01/23 0335 03/02/23 0423  NA 129* 131*  --  129* 130* 130*  K 5.0 5.5* 5.8* 5.4* 4.5 4.2  CL 99 99  --  99 97* 98  CO2 19*  19*  --  20* 19* 21*  GLUCOSE 98 91  --  103* 95 86  BUN 54* 67*  --  81* 84* 88*  CREATININE 3.87* 4.50*  --  4.96* 4.92* 4.97*  CALCIUM 7.8* 8.1*  --  8.0* 7.8* 7.6*  MG 1.6* 1.8  --  1.8 1.8 1.9  PHOS  --   --   --   --   --  8.2*   GFR: Estimated Creatinine Clearance: 16.2 mL/min (A) (by C-G formula based on SCr of 4.97 mg/dL (H)). Liver Function Tests: Recent Labs  Lab 03/02/23 0423  ALBUMIN 2.3*    No results for input(s): "LIPASE", "AMYLASE" in the last 168 hours. No results for input(s): "AMMONIA" in the last 168 hours. Coagulation Profile: No results for input(s): "INR", "PROTIME" in the last 168 hours. Cardiac Enzymes: No results for input(s): "CKTOTAL", "CKMB", "CKMBINDEX", "TROPONINI" in the last 168 hours. BNP (last 3 results) Recent Labs    09/25/22 1412  PROBNP 54.0   HbA1C: No results for input(s): "HGBA1C" in the last 72 hours. CBG: Recent Labs  Lab 03/01/23 1206 03/01/23 1716 03/01/23 2011 03/02/23 0726 03/02/23 1108  GLUCAP 97 88 126* 104* 93   Lipid Profile: No results for input(s): "CHOL", "HDL", "LDLCALC",  "TRIG", "CHOLHDL", "LDLDIRECT" in the last 72 hours. Thyroid Function Tests: No results for input(s): "TSH", "T4TOTAL", "FREET4", "T3FREE", "THYROIDAB" in the last 72 hours. Anemia Panel: No results for input(s): "VITAMINB12", "FOLATE", "FERRITIN", "TIBC", "IRON", "RETICCTPCT" in the last 72 hours.  Sepsis Labs: No results for input(s): "PROCALCITON", "LATICACIDVEN" in the last 168 hours.   No results found for this or any previous visit (from the past 240 hour(s)).        Radiology Studies: No results found.      Scheduled Meds:  feeding supplement  237 mL Oral TID BM   FLUoxetine  40 mg Oral Daily   heparin  5,000 Units Subcutaneous Q8H   ipratropium-albuterol  3 mL Nebulization BID   midodrine  2.5 mg Oral BID   nicotine  21 mg Transdermal Daily   pantoprazole  40 mg Oral Daily   ranolazine  500 mg Oral BID   tamsulosin  0.4 mg Oral QPC supper      LOS: 15 days    Time spent: 35 minutes     Hoover Brunette, DO [facility]  If 7PM-7AM, please contact night-coverage [email or web address] 03/02/2023, 12:48 PM

## 2023-03-02 NOTE — Progress Notes (Signed)
Occupational Therapy Treatment Patient Details Name: [name] MRN: [id number] DOB: 1956/06/08 Today's Date: 03/02/2023   History of present illness [name] is a 67 y.o. 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  Patient reports he had diarrhea at least 5 times last 5 hours.  He actually has an incontinent episode during my exam.  He denies any hematochezia or melena.  Patient reports he has not felt feverish although he did have a fever when he came into the ER.  He has had chest pain.  He describes his pain in the center of his chest and radiating left.  It feels like a heaviness.  It feels similar to when he had a MI in the past.  His troponin in the ER is actually downtrending from 20-17.  Patient reports he has not felt any increase in shortness of breath.  He is on 6 L nasal cannula at baseline.  He has not had any dizziness, malaise, fatigue.  He denies any cough.  Patient does report he has had urinary retention.  He has not had any urine output for the entire day.  He reports prior to that he was not having any dysuria.  Patient reports that yesterday his neighbor noticed that his right lower extremity was swollen.  Patient reports that it had felt like needles and cold.  He attributed it to his neuropathy.  It got worse throughout the night.  The bottom of the foot started hurting badly.  Patient reports that this was not normal for him.  He did not consider that it may be infected.  Patient reports that he takes Lyrica at home and has not had a "neuropathy flare" in quite some time.  His Lyrica was not helping last night.  In regards to patient's diarrhea, patient reports not  having been on any antibiotics recently.  He has had no abdominal pain.  Nobody else around him is having diarrhea.  Patient has no other complaints at this time.   OT comments  Pt agreeable to OT treatment. Pt supine in bed with nursing staff assisting at start of session. Mod to max A needed for bed mobility to get to the upright position at EOB. Pt required max A for donning R LE sock prior to transfer. Mod A for sit to stand using RW with more min A once standing to pivot to the chair. Nursing present and planning to clean pt's foot while he sat in the chair. Pt will benefit from continued OT in the hospital and recommended venue below to increase strength, balance, and endurance for safe ADL's.         If plan is discharge home, recommend the following:  A lot of help with walking and/or transfers;A lot of help with bathing/dressing/bathroom;Assistance with cooking/housework;Assist for transportation;Help with stairs or ramp for entrance   Equipment Recommendations  None recommended by OT    Recommendations for Other Services      Precautions / Restrictions Precautions Precautions: Fall Restrictions Weight Bearing Restrictions: No LUE Weight Bearing: Weight bearing as tolerated       Mobility Bed Mobility Overal bed mobility: Needs Assistance Bed Mobility: Rolling, Sidelying to Sit Rolling: Mod assist Sidelying to sit: Mod assist, Max  assist       General bed mobility comments: Much labored effort; very weak with much assist to push to upright position.    Transfers Overall transfer level: Needs assistance Equipment used: Rolling walker (2 wheels) Transfers: Sit to/from Stand, Bed to chair/wheelchair/BSC Sit to Stand: Mod assist     Step pivot transfers: Min assist     General transfer comment: Slow labored effort; unsteady with RW     Balance Overall balance assessment: Needs assistance Sitting-balance support: Feet supported, Bilateral upper extremity  supported Sitting balance-Leahy Scale: Fair Sitting balance - Comments: seated at EOB   Standing balance support: Bilateral upper extremity supported, During functional activity, Reliant on assistive device for balance Standing balance-Leahy Scale: Poor Standing balance comment: poor to fair  with RW                           ADL either performed or assessed with clinical judgement   ADL Overall ADL's : Needs assistance/impaired                     Lower Body Dressing: Maximal assistance;Sitting/lateral leans Lower Body Dressing Details (indicate cue type and reason): Assist to don R sock while seated at EOB                    Extremity/Trunk Assessment Upper Extremity Assessment Upper Extremity Assessment: LUE deficits/detail LUE Deficits / Details: L UE is still limited as per initial evaluation.                              Cognition Arousal: Alert Behavior During Therapy: WFL for tasks assessed/performed Overall Cognitive Status: Within Functional Limits for tasks assessed                                                             Pertinent Vitals/ Pain       Pain Assessment Pain Assessment: Faces Faces Pain Scale: No hurt                                                          Frequency  Min 2X/week        Progress Toward Goals  OT Goals(current goals can now be found in the care plan section)  Progress towards OT goals: Progressing toward goals  Acute Rehab OT Goals Patient Stated Goal: Open to rehab to improve function. OT Goal Formulation: With patient Time For Goal Achievement: 03/04/23 Potential to Achieve Goals: Good ADL Goals Pt Will Perform Grooming: sitting;with modified independence Pt Will Perform Upper Body Bathing: with modified independence;sitting Pt Will Perform Upper Body Dressing: with modified independence;sitting Pt Will Perform Lower Body  Dressing: with min assist;with adaptive equipment;sitting/lateral leans Pt Will Transfer to Toilet: with supervision;stand pivot transfer Pt Will Perform Toileting - Clothing Manipulation and hygiene: with modified independence;sitting/lateral leans Pt/caregiver will Perform Home Exercise Program: Increased strength;Increased ROM;Right Upper extremity;Left upper extremity;With minimal assist  Plan  End of Session Equipment Utilized During Treatment: Rolling walker (2 wheels);Gait belt  OT Visit Diagnosis: Unsteadiness on feet (R26.81);Other abnormalities of gait and mobility (R26.89);Muscle weakness (generalized) (M62.81)   Activity Tolerance Patient tolerated treatment well   Patient Left in chair;with call bell/phone within reach;with nursing/sitter in room   Nurse Communication Other (comment) (Present and observed session.)        Time: 6045-4098 OT Time Calculation (min): 11 min  Charges: OT General Charges $OT Visit: 1 Visit OT Treatments $Self Care/Home Management : 8-22 mins    OT, MOT  Danie Chandler 03/02/2023, 9:10 AM

## 2023-03-02 NOTE — Progress Notes (Signed)
Palliative: [name] is lying quietly in bed.  He appears chronically ill and somewhat frail.  He greets me, making and mostly keeping eye contact.  He is alert, able to make his needs known.  There is no family at bedside at this time.  We talked about his plan for hospice care.  We talked about symptom management needs.  No additional needs at this time. Order set reviewed.  Face-to-face conference with attending, bedside nursing staff, transition of care team related to patient condition, needs, goals of care, disposition.  Plan: Requesting comfort and dignity at end-of-life, residential hospice at [facility]. DNR/goldenrod form is on chart.     25 minutes  Kevin Carmel, NP Palliative medicine team Team phone [phone] Greater than 50% of this time was spent counseling and coordinating care related to the above assessment and plan.

## 2023-03-02 NOTE — TOC Progression Note (Signed)
Transition of Care [facility]) - Progression Note    Patient Details  Name: [name] MRN: [id number] Date of Birth: Apr 27, 1956  Transition of Care [facility] CM/SW Contact  Karn Cassis, [place] Phone Number: 03/02/2023, 2:19 PM  Clinical Narrative:  Pt now comfort care. LCSW spoke with pt at bedside about referral to residential hospice. Pt agreeable and requests [facility]. LCSW spoke with Sherri at [facility] who will review chart. TOC will follow.      Expected Discharge Plan: Skilled Nursing Facility Barriers to Discharge: Continued Medical Work up  Expected Discharge Plan and Services In-house Referral: Clinical Social Work   Post Acute Care Choice: Skilled Nursing Facility Living arrangements for the past 2 months: Single Family Home                                       Social Determinants of Health (SDOH) Interventions SDOH Screenings   Food Insecurity: No Food Insecurity (02/15/2023)  Housing: Patient Declined (02/15/2023)  Transportation Needs: No Transportation Needs (02/15/2023)  Utilities: Not At Risk (02/15/2023)  Depression (PHQ2-9): Low Risk  (04/24/2022)  Financial Resource Strain: Low Risk  (05/19/2019)  Physical Activity: Inactive (05/19/2019)  Social Connections: Unknown (11/28/2021)   Received from [facility]  Stress: No Stress Concern Present (05/19/2019)  Tobacco Use: High Risk (02/15/2023)    Readmission Risk Interventions    02/16/2023    8:31 AM 01/19/2023    1:19 PM 01/13/2023   10:44 AM  Readmission Risk Prevention Plan  Transportation Screening Complete  Complete  HRI or Home Care Consult   Complete  Social Work Consult for Recovery Care Planning/Counseling   Complete  Palliative Care Screening   Not Applicable  Medication Review Oceanographer) Complete  Complete  PCP or Specialist appointment within 3-5 days of discharge  Complete   HRI or Home Care Consult Complete Complete   SW Recovery  Care/Counseling Consult Complete Complete   Palliative Care Screening Not Applicable Not Applicable   Skilled Nursing Facility Not Applicable Not Applicable

## 2023-03-02 NOTE — Progress Notes (Signed)
Patient ID: [name], male   DOB: [birth year], 67 y.o.   MRN: [id number] S: Somnolent and minimally verbal this morning. O:BP 99/77 (BP Location: Left Arm)   Pulse 82   Temp 98.6 F (37 C) (Oral)   Resp 20   Ht 5\' 9"  (1.753 m)   Wt 91.9 kg   SpO2 97%   BMI 29.92 kg/m  No intake or output data in the 24 hours ending 03/02/23 1035 Intake/Output: I/O last 3 completed shifts: In: 120 [P.O.:120] Out: -   Intake/Output this shift:  No intake/output data recorded. Weight change:  ONG:EXBMWUXL in bed CVS: RRR Resp:occ rhonchi Abd: obese, +BS, soft, NT/ND Ext: trace pretibial edema  Recent Labs  Lab 02/24/23 0510 02/25/23 0442 02/25/23 1327 02/26/23 0432 02/27/23 0526 02/27/23 1531 02/28/23 0544 03/01/23 0335 03/02/23 0423  NA 130* 130*  --  129* 131*  --  129* 130* 130*  K 5.0 5.3* 5.6* 5.0 5.5* 5.8* 5.4* 4.5 4.2  CL 97* 99  --  99 99  --  99 97* 98  CO2 24 22  --  19* 19*  --  20* 19* 21*  GLUCOSE 114* 119*  --  98 91  --  103* 95 86  BUN 33* 43*  --  54* 67*  --  81* 84* 88*  CREATININE 2.51* 3.27*  --  3.87* 4.50*  --  4.96* 4.92* 4.97*  ALBUMIN  --   --   --   --   --   --   --   --  2.3*  CALCIUM 8.0* 7.9*  --  7.8* 8.1*  --  8.0* 7.8* 7.6*  PHOS  --   --   --   --   --   --   --   --  8.2*   Liver Function Tests: Recent Labs  Lab 03/02/23 0423  ALBUMIN 2.3*   No results for input(s): "LIPASE", "AMYLASE" in the last 168 hours. No results for input(s): "AMMONIA" in the last 168 hours. CBC: Recent Labs  Lab 02/25/23 0442 02/26/23 0432 02/27/23 0526 02/28/23 0544 03/01/23 0335  WBC 13.5* 11.9* 10.6* 9.3 8.4  HGB 9.1* 9.1* 9.7* 9.7* 9.2*  HCT 28.3* 28.6* 31.3* 30.6* 28.7*  MCV 83.5 83.1 83.2 82.5 81.5  PLT 224 205 216 229 237   Cardiac Enzymes: No results for input(s): "CKTOTAL", "CKMB", "CKMBINDEX", "TROPONINI" in the last 168 hours. CBG: Recent Labs  Lab 03/01/23 0750 03/01/23 1206 03/01/23 1716 03/01/23 2011 03/02/23 0726  GLUCAP 84 97  88 126* 104*    Iron Studies: No results for input(s): "IRON", "TIBC", "TRANSFERRIN", "FERRITIN" in the last 72 hours. Studies/Results: No results found.  atorvastatin  20 mg Oral QHS   clopidogrel  75 mg Oral Daily   dextromethorphan-guaiFENesin  1 tablet Oral BID   doxycycline  100 mg Oral Q12H   feeding supplement  237 mL Oral TID BM   FLUoxetine  40 mg Oral Daily   heparin  5,000 Units Subcutaneous Q8H   insulin aspart  0-5 Units Subcutaneous QHS   insulin aspart  0-9 Units Subcutaneous TID WC   ipratropium-albuterol  3 mL Nebulization BID   iron polysaccharides  150 mg Oral Daily   midodrine  2.5 mg Oral BID   multivitamin with minerals  1 tablet Oral Daily   nicotine  21 mg Transdermal Daily   pantoprazole  40 mg Oral Daily   ranolazine  500 mg Oral BID   sodium bicarbonate  650 mg Oral TID   sodium zirconium cyclosilicate  10 g Oral BID   tamsulosin  0.4 mg Oral QPC supper    BMET    Component Value Date/Time   NA 130 (L) 03/02/2023 0423   K 4.2 03/02/2023 0423   CL 98 03/02/2023 0423   CO2 21 (L) 03/02/2023 0423   GLUCOSE 86 03/02/2023 0423   BUN 88 (H) 03/02/2023 0423   CREATININE 4.97 (H) 03/02/2023 0423   CREATININE 1.89 (H) 04/24/2021 1524   CALCIUM 7.6 (L) 03/02/2023 0423   GFRNONAA 12 (L) 03/02/2023 0423   GFRAA 42 (L) 04/12/2020 1348   CBC    Component Value Date/Time   WBC 8.4 03/01/2023 0335   RBC 3.52 (L) 03/01/2023 0335   HGB 9.2 (L) 03/01/2023 0335   HCT 28.7 (L) 03/01/2023 0335   PLT 237 03/01/2023 0335   MCV 81.5 03/01/2023 0335   MCH 26.1 03/01/2023 0335   MCHC 32.1 03/01/2023 0335   RDW 14.6 03/01/2023 0335   LYMPHSABS 1.2 02/16/2023 0431   MONOABS 0.7 02/16/2023 0431   EOSABS 0.1 02/16/2023 0431   BASOSABS 0.1 02/16/2023 0431    Assessment/Plan:   AKI/CKD stage IIIa - likely ischemic ATN in setting of sepsis and diuretic use.  Likely was supra-therapeutic vanc as well.  BUN/Cr appear to have reached a plateau.  He denies any  nausea but is not eating.  He does not want dialysis and palliative care is following.  Unfortunately he appears to have declined over the past 24 hours and it may be time to transition to hospice. Hyperkalemia - improved with Lokelma.  Agree with sodium bicarb. Cellulitis of right foot - abx per primary team. HTN - stable for now, has history of orthostatic hypotension on midodrine at home. DM type 2 - per priamry COPD- per primary. FTT - palliative care following.  Irena Cords, MD [facility]

## 2023-03-02 NOTE — Plan of Care (Signed)
  Problem: Clinical Measurements: Goal: Ability to avoid or minimize complications of infection will improve Outcome: Not Progressing   Problem: Skin Integrity: Goal: Skin integrity will improve Outcome: Not Progressing   Problem: Education: Goal: Knowledge of General Education information will improve Description: Including pain rating scale, medication(s)/side effects and non-pharmacologic comfort measures Outcome: Not Progressing   Problem: Health Behavior/Discharge Planning: Goal: Ability to manage health-related needs will improve Outcome: Not Progressing   Problem: Clinical Measurements: Goal: Ability to maintain clinical measurements within normal limits will improve Outcome: Not Progressing Goal: Will remain free from infection Outcome: Not Progressing Goal: Diagnostic test results will improve Outcome: Not Progressing Goal: Respiratory complications will improve Outcome: Not Progressing Goal: Cardiovascular complication will be avoided Outcome: Not Progressing   Problem: Activity: Goal: Risk for activity intolerance will decrease Outcome: Not Progressing   Problem: Nutrition: Goal: Adequate nutrition will be maintained Outcome: Not Progressing   Problem: Coping: Goal: Level of anxiety will decrease Outcome: Not Progressing   Problem: Elimination: Goal: Will not experience complications related to bowel motility Outcome: Not Progressing Goal: Will not experience complications related to urinary retention Outcome: Not Progressing   Problem: Pain Managment: Goal: General experience of comfort will improve Outcome: Not Progressing   Problem: Safety: Goal: Ability to remain free from injury will improve Outcome: Not Progressing   Problem: Skin Integrity: Goal: Risk for impaired skin integrity will decrease Outcome: Not Progressing   Problem: Education: Goal: Ability to describe self-care measures that may prevent or decrease complications (Diabetes  Survival Skills Education) will improve Outcome: Not Progressing Goal: Individualized Educational Video(s) Outcome: Not Progressing   Problem: Coping: Goal: Ability to adjust to condition or change in health will improve Outcome: Not Progressing   Problem: Fluid Volume: Goal: Ability to maintain a balanced intake and output will improve Outcome: Not Progressing   Problem: Health Behavior/Discharge Planning: Goal: Ability to identify and utilize available resources and services will improve Outcome: Not Progressing Goal: Ability to manage health-related needs will improve Outcome: Not Progressing   Problem: Metabolic: Goal: Ability to maintain appropriate glucose levels will improve Outcome: Not Progressing   Problem: Nutritional: Goal: Maintenance of adequate nutrition will improve Outcome: Not Progressing Goal: Progress toward achieving an optimal weight will improve Outcome: Not Progressing   Problem: Skin Integrity: Goal: Risk for impaired skin integrity will decrease Outcome: Not Progressing   Problem: Tissue Perfusion: Goal: Adequacy of tissue perfusion will improve Outcome: Not Progressing   Problem: Education: Goal: Knowledge of the prescribed therapeutic regimen will improve Outcome: Not Progressing   Problem: Coping: Goal: Ability to identify and develop effective coping behavior will improve Outcome: Not Progressing   Problem: Clinical Measurements: Goal: Quality of life will improve Outcome: Not Progressing   Problem: Respiratory: Goal: Verbalizations of increased ease of respirations will increase Outcome: Not Progressing   Problem: Role Relationship: Goal: Family's ability to cope with current situation will improve Outcome: Not Progressing Goal: Ability to verbalize concerns, feelings, and thoughts to partner or family member will improve Outcome: Not Progressing   Problem: Pain Management: Goal: Satisfaction with pain management regimen will  improve Outcome: Not Progressing

## 2023-03-03 ENCOUNTER — Other Ambulatory Visit: Payer: Self-pay | Admitting: Cardiology

## 2023-03-03 DIAGNOSIS — Z7189 Other specified counseling: Secondary | ICD-10-CM | POA: Diagnosis not present

## 2023-03-03 DIAGNOSIS — Z515 Encounter for palliative care: Secondary | ICD-10-CM | POA: Diagnosis not present

## 2023-03-03 DIAGNOSIS — L0390 Cellulitis, unspecified: Secondary | ICD-10-CM | POA: Diagnosis not present

## 2023-03-03 DIAGNOSIS — A419 Sepsis, unspecified organism: Secondary | ICD-10-CM | POA: Diagnosis not present

## 2023-03-03 LAB — GLUCOSE, CAPILLARY: Glucose-Capillary: 115 mg/dL — ABNORMAL HIGH (ref 70–99)

## 2023-03-03 MED ORDER — MORPHINE SULFATE (CONCENTRATE) 10 MG/0.5ML PO SOLN
5.0000 mg | ORAL | Status: DC
  Administered 2023-03-03: 5 mg via ORAL
  Filled 2023-03-03: qty 0.5

## 2023-03-03 NOTE — Progress Notes (Signed)
[name] [address]  Hospice hospital liaison note  Received request from [facility] for patient interest in [facility]. Met with patient at bedside and explained services. At this time, patient does not meet criteria for IPU.   Hospital Liaison will continue to follow and assist as able.   Please do not hesitate to call with any hospice related questions or concerns.   Thank you for the opportunity to participate in this patient's care.  Thea Gist, Charity fundraiser, [facility] Liaison  [phone]

## 2023-03-03 NOTE — Plan of Care (Signed)
  Problem: Clinical Measurements: Goal: Respiratory complications will improve Outcome: Progressing Goal: Cardiovascular complication will be avoided Outcome: Progressing   Problem: Activity: Goal: Risk for activity intolerance will decrease Outcome: Progressing   Problem: Nutrition: Goal: Adequate nutrition will be maintained Outcome: Progressing   Problem: Coping: Goal: Level of anxiety will decrease Outcome: Progressing   Problem: Elimination: Goal: Will not experience complications related to bowel motility Outcome: Progressing Goal: Will not experience complications related to urinary retention Outcome: Progressing   Problem: Pain Managment: Goal: General experience of comfort will improve Outcome: Progressing   Problem: Safety: Goal: Ability to remain free from injury will improve Outcome: Progressing   Problem: Education: Goal: Ability to describe self-care measures that may prevent or decrease complications (Diabetes Survival Skills Education) will improve Outcome: Progressing Goal: Individualized Educational Video(s) Outcome: Progressing   Problem: Coping: Goal: Ability to adjust to condition or change in health will improve Outcome: Progressing   Problem: Fluid Volume: Goal: Ability to maintain a balanced intake and output will improve Outcome: Progressing   Problem: Health Behavior/Discharge Planning: Goal: Ability to identify and utilize available resources and services will improve Outcome: Progressing   Problem: Metabolic: Goal: Ability to maintain appropriate glucose levels will improve Outcome: Progressing   Problem: Nutritional: Goal: Maintenance of adequate nutrition will improve Outcome: Progressing Goal: Progress toward achieving an optimal weight will improve Outcome: Progressing   Problem: Education: Goal: Knowledge of the prescribed therapeutic regimen will improve Outcome: Progressing   Problem: Coping: Goal: Ability to identify  and develop effective coping behavior will improve Outcome: Progressing   Problem: Respiratory: Goal: Verbalizations of increased ease of respirations will increase Outcome: Progressing   Problem: Role Relationship: Goal: Family's ability to cope with current situation will improve Outcome: Progressing Goal: Ability to verbalize concerns, feelings, and thoughts to partner or family member will improve Outcome: Progressing   Problem: Pain Management: Goal: Satisfaction with pain management regimen will improve Outcome: Progressing

## 2023-03-03 NOTE — TOC Progression Note (Signed)
Transition of Care [facility]) - Progression Note    Patient Details  Name: [name] MRN: [id number] Date of Birth: [birth year]  Transition of [facility]) CM/SW Contact  Karn Cassis, [place] Phone Number: 03/03/2023, 1:51 PM  Clinical Narrative: Per [facility], pt is not IPU appropriate. Discussed with pt who is agreeable to [facility]. Referral made and RN to evaluate this afternoon.       Expected Discharge Plan: Skilled Nursing Facility Barriers to Discharge: Continued Medical Work up  Expected Discharge Plan and Services In-house Referral: Clinical Social Work   Post Acute Care Choice: Skilled Nursing Facility Living arrangements for the past 2 months: Single Family Home                                       Social Determinants of Health (SDOH) Interventions SDOH Screenings   Food Insecurity: No Food Insecurity (02/15/2023)  Housing: Patient Declined (02/15/2023)  Transportation Needs: No Transportation Needs (02/15/2023)  Utilities: Not At Risk (02/15/2023)  Depression (PHQ2-9): Low Risk  (04/24/2022)  Financial Resource Strain: Low Risk  (05/19/2019)  Physical Activity: Inactive (05/19/2019)  Social Connections: Unknown (11/28/2021)   Received from [facility]  Stress: No Stress Concern Present (05/19/2019)  Tobacco Use: High Risk (02/15/2023)    Readmission Risk Interventions    02/16/2023    8:31 AM 01/19/2023    1:19 PM 01/13/2023   10:44 AM  Readmission Risk Prevention Plan  Transportation Screening Complete  Complete  HRI or Home Care Consult   Complete  Social Work Consult for Recovery Care Planning/Counseling   Complete  Palliative Care Screening   Not Applicable  Medication Review Oceanographer) Complete  Complete  PCP or Specialist appointment within 3-5 days of discharge  Complete   HRI or Home Care Consult Complete Complete   SW Recovery Care/Counseling Consult Complete Complete   Palliative Care Screening Not  Applicable Not Applicable   Skilled Nursing Facility Not Applicable Not Applicable

## 2023-03-03 NOTE — Progress Notes (Signed)
S: Seen by palliative and is transitioning to comfort care.  O:BP (!) 166/79 (BP Location: Right Arm)   Pulse 88   Temp 97.6 F (36.4 C) (Oral)   Resp 17   Ht 5\' 9"  (1.753 m)   Wt 91.9 kg   SpO2 96%   BMI 29.92 kg/m   Intake/Output Summary (Last 24 hours) at 03/03/2023 1223 Last data filed at 03/03/2023 0900 Gross per 24 hour  Intake 720 ml  Output 500 ml  Net 220 ml   Intake/Output: I/O last 3 completed shifts: In: 720 [P.O.:720] Out: 500 [Urine:500]  Intake/Output this shift:  Total I/O In: 240 [P.O.:240] Out: -  Weight change:    Recent Labs  Lab 02/25/23 0442 02/25/23 1327 02/26/23 0432 02/27/23 0526 02/27/23 1531 02/28/23 0544 03/01/23 0335 03/02/23 0423  NA 130*  --  129* 131*  --  129* 130* 130*  K 5.3* 5.6* 5.0 5.5* 5.8* 5.4* 4.5 4.2  CL 99  --  99 99  --  99 97* 98  CO2 22  --  19* 19*  --  20* 19* 21*  GLUCOSE 119*  --  98 91  --  103* 95 86  BUN 43*  --  54* 67*  --  81* 84* 88*  CREATININE 3.27*  --  3.87* 4.50*  --  4.96* 4.92* 4.97*  ALBUMIN  --   --   --   --   --   --   --  2.3*  CALCIUM 7.9*  --  7.8* 8.1*  --  8.0* 7.8* 7.6*  PHOS  --   --   --   --   --   --   --  8.2*   Liver Function Tests: Recent Labs  Lab 03/02/23 0423  ALBUMIN 2.3*   No results for input(s): "LIPASE", "AMYLASE" in the last 168 hours. No results for input(s): "AMMONIA" in the last 168 hours. CBC: Recent Labs  Lab 02/25/23 0442 02/26/23 0432 02/27/23 0526 02/28/23 0544 03/01/23 0335  WBC 13.5* 11.9* 10.6* 9.3 8.4  HGB 9.1* 9.1* 9.7* 9.7* 9.2*  HCT 28.3* 28.6* 31.3* 30.6* 28.7*  MCV 83.5 83.1 83.2 82.5 81.5  PLT 224 205 216 229 237   Cardiac Enzymes: No results for input(s): "CKTOTAL", "CKMB", "CKMBINDEX", "TROPONINI" in the last 168 hours. CBG: Recent Labs  Lab 03/01/23 2011 03/02/23 0726 03/02/23 1108 03/02/23 1615 03/03/23 1212  GLUCAP 126* 104* 93 95 115*    Iron Studies: No results for input(s): "IRON", "TIBC", "TRANSFERRIN", "FERRITIN" in  the last 72 hours. Studies/Results: No results found.  feeding supplement  237 mL Oral TID BM   FLUoxetine  40 mg Oral Daily   ipratropium-albuterol  3 mL Nebulization BID   midodrine  2.5 mg Oral BID   morphine CONCENTRATE  5 mg Oral Q4H   nicotine  21 mg Transdermal Daily   pantoprazole  40 mg Oral Daily   ranolazine  500 mg Oral BID   tamsulosin  0.4 mg Oral QPC supper    BMET    Component Value Date/Time   NA 130 (L) 03/02/2023 0423   K 4.2 03/02/2023 0423   CL 98 03/02/2023 0423   CO2 21 (L) 03/02/2023 0423   GLUCOSE 86 03/02/2023 0423   BUN 88 (H) 03/02/2023 0423   CREATININE 4.97 (H) 03/02/2023 0423   CREATININE 1.89 (H) 04/24/2021 1524   CALCIUM 7.6 (L) 03/02/2023 0423   GFRNONAA 12 (L) 03/02/2023 0423   GFRAA 42 (  L) 04/12/2020 1348   CBC    Component Value Date/Time   WBC 8.4 03/01/2023 0335   RBC 3.52 (L) 03/01/2023 0335   HGB 9.2 (L) 03/01/2023 0335   HCT 28.7 (L) 03/01/2023 0335   PLT 237 03/01/2023 0335   MCV 81.5 03/01/2023 0335   MCH 26.1 03/01/2023 0335   MCHC 32.1 03/01/2023 0335   RDW 14.6 03/01/2023 0335   LYMPHSABS 1.2 02/16/2023 0431   MONOABS 0.7 02/16/2023 0431   EOSABS 0.1 02/16/2023 0431   BASOSABS 0.1 02/16/2023 0431    Assessment/Plan:   AKI/CKD stage IIIa - likely ischemic ATN in setting of sepsis and diuretic use.  Likely was supra-therapeutic vanc as well.  BUN/Cr appear to have reached a plateau.  He denies any nausea but is not eating.  He does not want dialysis and palliative care is following.  Unfortunately he appears to have declined over the past 48 hours and will transition to hospice.  Nothing further to add and will sign off.  Please call with questions or concerns.  Hyperkalemia - improved with Lokelma.  Agree with sodium bicarb. Cellulitis of right foot - abx per primary team. HTN - stable for now, has history of orthostatic hypotension on midodrine at home. DM type 2 - per priamry COPD- per primary. FTT - palliative  care following.  Irena Cords, MD [employer] [phone]

## 2023-03-03 NOTE — TOC Progression Note (Signed)
Transition of Care [facility]) - Progression Note    Patient Details  Name: [name] MRN: [id number] Date of Birth: 25-Mar-1956  Transition of Care [facility]) CM/SW Contact  Karn Cassis, [place] Phone Number: 03/03/2023, 2:40 PM  Clinical Narrative: [facility] evaluated pt and pt has decided to return home with hospice. LCSW spoke with pt's neighbor, Brynda Greathouse with pt's permission who confirms he and his wife Amy will be caregivers. [facility] aware. [facility] to order DME and anticipate d/c home tomorrow.     Expected Discharge Plan: Skilled Nursing Facility Barriers to Discharge: Continued Medical Work up  Expected Discharge Plan and Services In-house Referral: Clinical Social Work   Post Acute Care Choice: Skilled Nursing Facility Living arrangements for the past 2 months: Single Family Home                                       Social Determinants of Health (SDOH) Interventions SDOH Screenings   Food Insecurity: No Food Insecurity (02/15/2023)  Housing: Patient Declined (02/15/2023)  Transportation Needs: No Transportation Needs (02/15/2023)  Utilities: Not At Risk (02/15/2023)  Depression (PHQ2-9): Low Risk  (04/24/2022)  Financial Resource Strain: Low Risk  (05/19/2019)  Physical Activity: Inactive (05/19/2019)  Social Connections: Unknown (11/28/2021)   [facility] from [facility]  Stress: No Stress Concern Present (05/19/2019)  Tobacco Use: High Risk (02/15/2023)    Readmission Risk Interventions    02/16/2023    8:31 AM 01/19/2023    1:19 PM 01/13/2023   10:44 AM  Readmission Risk Prevention Plan  Transportation Screening Complete  Complete  HRI or Home Care Consult   Complete  Social Work Consult for Recovery Care Planning/Counseling   Complete  Palliative Care Screening   Not Applicable  Medication Review [employer]) Complete  Complete  PCP or Specialist appointment within 3-5 days of discharge  Complete   HRI or Home Care Consult Complete  Complete   SW Recovery Care/Counseling Consult Complete Complete   Palliative Care Screening Not Applicable Not Applicable   Skilled Nursing Facility Not Applicable Not Applicable

## 2023-03-03 NOTE — Progress Notes (Signed)
PROGRESS NOTE    [name]  BJY:[id number] DOB: 1955-09-09 DOA: 02/15/2023 PCP: Genia Hotter, FNP   Brief Narrative:    67 y.o. 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 The patient's pain has not improved, recurrently febrile, so vancomycin is added. Blood culture repeated with no growth to date.  He is now noted to have worsening AKI and nephrology consultation pending.  Palliative also following as he appears to be hospice appropriate.  Transition to comfort care 8/13.  Assessment & Plan:   Principal Problem:   Sepsis due to cellulitis [facility]) Active Problems:   Hypokalemia   Type 2 diabetes mellitus ([name])   Hyponatremia   Essential hypertension   Anxiety state   Mixed hyperlipidemia   Smoking   GERD without esophagitis   Peripheral neuropathy   Cellulitis in diabetic foot ([name])   Aortic mural thrombus ([name])   Malnutrition of moderate degree  Assessment and Plan:  Right foot cellulitis/sepsis, present on admission Discontinue further treatments and transition to comfort care   PAD Underwent ABI measurements during this hospital stay.  Right ABI 0.69 (moderate), Left ABI normal.  Continue atorvastatin, ranexa and plavix. Patient with prior transmetatarsal amputation of the left foot No obvious vascular compromise noted on examination.  His cellulitis appears to be improving.  He can follow-up with vascular surgery in the outpatient setting.  If however his symptoms recur then will have low threshold to do a CT angiogram of right lower extremity.   Essential hypertension:  Discontinue further treatments   IDT2DM with diabetic peripheral  neuropathy: HbA1c 9.3%.  -Further treatments discontinued - Continue lyrica   COPD/emphysema/tobacco abuse: - Continue bronchodilators - Continue nicotine patch Stable for the most part.  Hyperkalemia No further monitoring or treatments   AKI-worsening on CKD stage IIIa, oliguric Discussed with nephrology, transition to comfort care   Aortic mural thrombus:  No further Plavix   Normocytic anemia Likely has anemia of chronic disease.  Some drop in hemoglobin is noted during this hospital stay.  Hemoglobin stable.   Chronic hypoxic respiratory failure, ILD On 6L O2, says he's been on this since 2015.  - At baseline.    OSA:  - CPAP qHS   Anxiety:  -Ativan as needed   GERD:  - PPI   Moderate protein calorie malnutrition:  - Supp protein   s/p left shoulder hemiplasty/reduction of chronic dislocation 6/26 by Dr. Everardo Pacific:  - Continue pain control, PT   Debility: Pt has marked weakness diffusely - PT/OT consulted > SNF rehabilitation initially being pursued, now a candidate for hospice facility.  Transitioned to comfort measures 8/13    DVT prophylaxis: None Code Status: DNR Family Communication: Discussed with brother [name] 8/13 on phone Disposition Plan: SNF, anticipate likely transition to hospice/comfort care soon Status is: Inpatient Remains inpatient appropriate because: Need for IV fluid and medications.   Skin Assessment:  I have examined the patient's skin and I agree with the wound assessment as performed by the wound care RN as outlined below:  Pressure Injury 02/22/23 Sacrum Medial Stage 1 -  Intact skin with non-blanchable redness of a localized area usually over a bony prominence. (Active)  02/22/23 1019  Location: Sacrum  Location Orientation: Medial  Staging: Stage 1 -  Intact skin with non-blanchable redness of a localized area usually over a bony prominence.  Wound Description (Comments):   Present on Admission:     Consultants:   Nephrology Palliative  Procedures:  None  Antimicrobials:  Anti-infectives (From admission, onward)    Start     Dose/Rate Route Frequency Ordered Stop   02/24/23 1200  doxycycline (VIBRA-TABS) tablet 100 mg  Status:  Discontinued        100 mg Oral Every 12 hours 02/24/23 1009 03/02/23 1246   02/24/23 0817  vancomycin variable dose per unstable renal function (pharmacist dosing)  Status:  Discontinued         Does not apply See admin instructions 02/24/23 0817 02/24/23 1009   02/21/23 1400  vancomycin (VANCOCIN) IVPB 1000 mg/200 mL premix  Status:  Discontinued        1,000 mg 200 mL/hr over 60 Minutes Intravenous Every 24 hours 02/20/23 1342 02/24/23 0817   02/20/23 1430  vancomycin (VANCOREADY) IVPB 2000 mg/400 mL        2,000 mg 200 mL/hr over 120 Minutes Intravenous  Once 02/20/23 1342 02/20/23 1739   02/16/23 1200  cefTRIAXone (ROCEPHIN) 1 g in sodium chloride 0.9 % 100 mL IVPB        1 g 200 mL/hr over 30 Minutes Intravenous Every 24 hours 02/15/23 1954 02/22/23 1325   02/15/23 1215  cefTRIAXone (ROCEPHIN) 1 g in sodium chloride 0.9 % 100 mL IVPB        1 g 200 mL/hr over 30 Minutes Intravenous  Once 02/15/23 1200 02/15/23 1238      Subjective: Patient seen and evaluated today and states that he is feeling okay, and states pain is controlled.  Anticipating discharge in a.m. to home hospice.  Objective: Vitals:   03/02/23 2052 03/03/23 0708 03/03/23 1218 03/03/23 1314  BP: (!) 166/79   (!) 162/76  Pulse: 88   80  Resp: 17   17  Temp: 97.6 F (36.4 C)   98 F (36.7 C)  TempSrc: Oral   Oral  SpO2:  95% 96% 96%  Weight:      Height:        Intake/Output Summary (Last 24 hours) at 03/03/2023 1437 Last data filed at 03/03/2023 0900 Gross per 24 hour  Intake 480 ml  Output --  Net 480 ml    Filed Weights   02/15/23 1142 02/15/23 1900  Weight: 90.7 kg 91.9 kg    Examination:  General exam: Appears calm and comfortable  Respiratory system: Clear to  auscultation. Respiratory effort normal.  5 L nasal cannula at baseline. Cardiovascular system: S1 & S2 heard, RRR.  Gastrointestinal system: Abdomen is soft Central nervous system: Alert and awake Extremities: Right foot erythema Skin: No significant lesions noted Psychiatry: Flat affect.    Data Reviewed: I have personally reviewed following labs and imaging studies  CBC: Recent Labs  Lab 02/25/23 0442 02/26/23 0432 02/27/23 0526 02/28/23 0544 03/01/23 0335  WBC 13.5* 11.9* 10.6* 9.3 8.4  HGB 9.1* 9.1* 9.7* 9.7* 9.2*  HCT 28.3* 28.6* 31.3* 30.6* 28.7*  MCV 83.5 83.1 83.2 82.5 81.5  PLT 224 205 216 229 237   Basic Metabolic Panel: Recent Labs  Lab 02/26/23 0432 02/27/23 0526 02/27/23 1531 02/28/23 0544 03/01/23 0335 03/02/23 0423  NA 129* 131*  --  129* 130* 130*  K 5.0 5.5* 5.8* 5.4* 4.5 4.2  CL 99 99  --  99 97* 98  CO2 19*  19*  --  20* 19* 21*  GLUCOSE 98 91  --  103* 95 86  BUN 54* 67*  --  81* 84* 88*  CREATININE 3.87* 4.50*  --  4.96* 4.92* 4.97*  CALCIUM 7.8* 8.1*  --  8.0* 7.8* 7.6*  MG 1.6* 1.8  --  1.8 1.8 1.9  PHOS  --   --   --   --   --  8.2*   GFR: Estimated Creatinine Clearance: 16.2 mL/min (A) (by C-G formula based on SCr of 4.97 mg/dL (H)). Liver Function Tests: Recent Labs  Lab 03/02/23 0423  ALBUMIN 2.3*    No results for input(s): "LIPASE", "AMYLASE" in the last 168 hours. No results for input(s): "AMMONIA" in the last 168 hours. Coagulation Profile: No results for input(s): "INR", "PROTIME" in the last 168 hours. Cardiac Enzymes: No results for input(s): "CKTOTAL", "CKMB", "CKMBINDEX", "TROPONINI" in the last 168 hours. BNP (last 3 results) Recent Labs    09/25/22 1412  PROBNP 54.0   HbA1C: No results for input(s): "HGBA1C" in the last 72 hours. CBG: Recent Labs  Lab 03/01/23 2011 03/02/23 0726 03/02/23 1108 03/02/23 1615 03/03/23 1212  GLUCAP 126* 104* 93 95 115*   Lipid Profile: No results for input(s): "CHOL",  "HDL", "LDLCALC", "TRIG", "CHOLHDL", "LDLDIRECT" in the last 72 hours. Thyroid Function Tests: No results for input(s): "TSH", "T4TOTAL", "FREET4", "T3FREE", "THYROIDAB" in the last 72 hours. Anemia Panel: No results for input(s): "VITAMINB12", "FOLATE", "FERRITIN", "TIBC", "IRON", "RETICCTPCT" in the last 72 hours.  Sepsis Labs: No results for input(s): "PROCALCITON", "LATICACIDVEN" in the last 168 hours.   No results found for this or any previous visit (from the past 240 hour(s)).        Radiology Studies: No results found.      Scheduled Meds:  feeding supplement  237 mL Oral TID BM   FLUoxetine  40 mg Oral Daily   ipratropium-albuterol  3 mL Nebulization BID   midodrine  2.5 mg Oral BID   nicotine  21 mg Transdermal Daily   pantoprazole  40 mg Oral Daily   ranolazine  500 mg Oral BID   tamsulosin  0.4 mg Oral QPC supper      LOS: 16 days    Time spent: 35 minutes     Hoover Brunette, DO [facility]  If 7PM-7AM, please contact night-coverage [email or web address] 03/03/2023, 2:37 PM

## 2023-03-03 NOTE — Progress Notes (Signed)
Physical Therapy Treatment Patient Details Name: [name] MRN: [id number] DOB: November 05, 1955 Today's Date: 03/03/2023   History of Present Illness [name] is a 67 y.o. 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  Patient reports he had diarrhea at least 5 times last 5 hours.  He actually has an incontinent episode during my exam.  He denies any hematochezia or melena.  Patient reports he has not felt feverish although he did have a fever when he came into the ER.  He has had chest pain.  He describes his pain in the center of his chest and radiating left.  It feels like a heaviness.  It feels similar to when he had a MI in the past.  His troponin in the ER is actually downtrending from 20-17.  Patient reports he has not felt any increase in shortness of breath.  He is on 6 L nasal cannula at baseline.  He has not had any dizziness, malaise, fatigue.  He denies any cough.  Patient does report he has had urinary retention.  He has not had any urine output for the entire day.  He reports prior to that he was not having any dysuria.  Patient reports that yesterday his neighbor noticed that his right lower extremity was swollen.  Patient reports that it had felt like needles and cold.  He attributed it to his neuropathy.  It got worse throughout the night.  The bottom of the foot started hurting badly.  Patient reports that this was not normal for him.  He did not consider that it may be infected.  Patient reports that he takes Lyrica at home and has not had a "neuropathy flare" in quite some time.  His Lyrica was not helping last night.  In regards to patient's diarrhea, patient reports not  having been on any antibiotics recently.  He has had no abdominal pain.  Nobody else around him is having diarrhea.  Patient has no other complaints at this time.    PT Comments  Pt up in chair and receptive to therapy today.  Pt requires min-mod assist with transfers and ambulation using 6 L oxygen.  Pt able to increase distance today, however required several standing rest breaks during gait.  Pt with one LOB but able to self correct upon initially standing.  Pt request to return to supine following ambulation.  No pain or issues reported during or at completion of session.     If plan is discharge home, recommend the following: A lot of help with walking and/or transfers;A lot of help with bathing/dressing/bathroom;Assistance with cooking/housework;Assist for transportation;Help with stairs or ramp for entrance   Can travel by private vehicle     Yes        Precautions / Restrictions Precautions Precautions: Fall Restrictions Weight Bearing Restrictions: No LUE Weight Bearing: Weight bearing as tolerated     Mobility  Bed Mobility Overal bed mobility: Needs Assistance Bed Mobility: Rolling, Sidelying to Sit Rolling: Mod assist, Min assist Sidelying to sit: Mod assist, Min assist   Sit to supine: Mod assist   General bed mobility comments: less labored effort and weakness today as presented last session.    Transfers Overall transfer level: Needs assistance Equipment  used: Rolling walker (2 wheels) Transfers: Sit to/from Stand, Bed to chair/wheelchair/BSC Sit to Stand: Mod assist                Ambulation/Gait Ambulation/Gait assistance: Min assist Gait Distance (Feet): 200 Feet Assistive device: Rolling walker (2 wheels) Gait Pattern/deviations: Decreased step length - right, Decreased step length - left, Trunk flexed, Narrow base of support       General Gait Details: slow gait, stopping to take standing rest X2         Cognition Arousal: Alert Behavior  During Therapy: WFL for tasks assessed/performed Overall Cognitive Status: Within Functional Limits for tasks assessed                                                 Pertinent Vitals/Pain Pain Assessment Pain Assessment: No/denies pain     PT Goals (current goals can now be found in the care plan section) Acute Rehab PT Goals Patient Stated Goal: Return home with family to assist PT Goal Formulation: With patient Time For Goal Achievement: 03/04/23 Potential to Achieve Goals: Fair    Frequency    Min 3X/week      PT Plan Current plan remains appropriate    Co-evaluation     PT goals addressed during session: Mobility/safety with mobility;Balance;Proper use of DME;Strengthening/ROM        AM-PAC PT "6 Clicks" Mobility   Outcome Measure  Help needed turning from your back to your side while in a flat bed without using bedrails?: A Little Help needed moving from lying on your back to sitting on the side of a flat bed without using bedrails?: A Little Help needed moving to and from a bed to a chair (including a wheelchair)?: A Little Help needed standing up from a chair using your arms (e.g., wheelchair or bedside chair)?: A Lot Help needed to walk in hospital room?: A Lot Help needed climbing 3-5 steps with a railing? : Total 6 Click Score: 14    End of Session Equipment Utilized During Treatment: Gait belt Activity Tolerance: Patient limited by fatigue;Other (comment) (Patient limited by dizziness) Patient left: with call bell/phone within reach;in bed Nurse Communication: Mobility status PT Visit Diagnosis: Unsteadiness on feet (R26.81);Other abnormalities of gait and mobility (R26.89);Muscle weakness (generalized) (M62.81)     Time: 6213-0865 PT Time Calculation (min) (ACUTE ONLY): 18 min  Charges:    $Gait Training: 8-22 mins PT General Charges $$ ACUTE PT VISIT: 1 Visit                     Lurena Nida, PTA/CLT [facility]  Outpatient Rehabilitation [facility] Ph: [phone]   Lurena Nida 03/03/2023, 10:58 AM

## 2023-03-03 NOTE — Progress Notes (Signed)
Palliative: [name] was transitioned to comfort care on 8/13.  He is resting comfortably.  No concerns or complaints at this time.  End-of-life order set is in place.  [name] was evaluated by [facility], but they declined residential hospice placement.  He is agreeable to evaluation by [facility].  Orders reviewed and adjusted.  Conference with attending, bedside nursing staff, transition of care team related to patient condition, needs, goals of care, disposition.  Plan: Requesting comfort and dignity at end-of-life, residential hospice placement.  Requesting evaluation by [facility]. Prognosis: Anticipate approximately 2 weeks based on increasing creatinine to 4.9 with no desire for HD, decreasing functional status, right foot cellulitis/sepsis without further treatment, patient's desire to focus on comfort and dignity, let nature take its course.. DNR/goldenrod form is on chart.  35 minutes  Lillia Carmel, NP Palliative medicine team Team phone [phone] Greater than 50% of this time was spent counseling and coordinating care related to the above assessment and plan.

## 2023-03-03 NOTE — Progress Notes (Signed)
Nutrition Brief Note  Chart reviewed. Pt now transitioning to comfort care. Pt requesting bed at [facility]. No further nutrition interventions planned at this time.  Please re-consult as needed.   Levada Schilling, RD, LDN, CDCES Registered Dietitian II Certified Diabetes Care and Education Specialist Please refer to [facility] for RD and/or RD on-call/weekend/after hours pager

## 2023-03-03 NOTE — Progress Notes (Signed)
Mobility Specialist Progress Note:    03/03/23 1330  Mobility  Activity Ambulated with assistance to bathroom  Level of Assistance Minimal assist, patient does 75% or more  Assistive Device Front wheel walker  Distance Ambulated (ft) 6 ft  Range of Motion/Exercises Active;All extremities  LUE Weight Bearing WBAT  Activity Response Tolerated well  Mobility Referral No  $Mobility charge 1 Mobility  Mobility Specialist Start Time (ACUTE ONLY) 1330  Mobility Specialist Stop Time (ACUTE ONLY) 1340  Mobility Specialist Time Calculation (min) (ACUTE ONLY) 10 min   Family member requested assistance to transfer pt BR>B. Required MinA to stand and ambulate with RW. Tolerated well, pt was received not wearing [place], SpO2 76% on RA. Pt recovered sitting EOB, SpO2 92% on 6L. Left pt in bed with family and hospice care, nurse notified. All needs met.   Lawerance Bach Mobility Specialist Please contact via Special educational needs teacher or  Rehab office at [phone]

## 2023-03-04 DIAGNOSIS — Z515 Encounter for palliative care: Secondary | ICD-10-CM | POA: Diagnosis not present

## 2023-03-04 DIAGNOSIS — L0390 Cellulitis, unspecified: Secondary | ICD-10-CM | POA: Diagnosis not present

## 2023-03-04 DIAGNOSIS — A419 Sepsis, unspecified organism: Secondary | ICD-10-CM | POA: Diagnosis not present

## 2023-03-04 DIAGNOSIS — Z7189 Other specified counseling: Secondary | ICD-10-CM | POA: Diagnosis not present

## 2023-03-04 MED ORDER — MORPHINE SULFATE 20 MG/5ML PO SOLN: 2.5000 mg | ORAL | 0 refills | Status: DC | PRN

## 2023-03-04 MED ORDER — LORAZEPAM 2 MG/ML PO CONC: 2.0000 mg | ORAL | 0 refills | Status: DC | PRN

## 2023-03-04 NOTE — Consult Note (Signed)
   [facility] Inpatient Consult   03/04/2023  [name] [birth year] [id number]   Follow up: Disposition Update  [facility] Liaison remote coverage review for patient admitted to [facility]  coverage for [name], [facility] Liaison  [facility] [[facility]]  Accountable Care Organization [ACO] Patient: [facility] [facility] Medicare [Dual Complete]  Chart reviewed and reveals the patient is currently transitioning to Hospice with [facility] for Home with Hospice   Plan: Patient will have full case management services through Hospice and needs will be met at the hospice level of care. No [facility] Care Management is planned for transitional needs. Will sign off at transition from hospital.  For questions,   Charlesetta Shanks, RN BSN CCM [facility]  [phone] business mobile phone Toll free office [phone]  Fax number: [phone] [email or web address]

## 2023-03-04 NOTE — Plan of Care (Signed)
  Problem: Respiratory: Goal: Verbalizations of increased ease of respirations will increase Outcome: Progressing   

## 2023-03-04 NOTE — Discharge Summary (Signed)
Physician Discharge Summary  [name] QMV:[id number] DOB: [birth year] DOA: 02/15/2023  PCP: Genia Hotter, FNP  Admit date: 02/15/2023  Discharge date: 03/04/2023  Admitted From:Home  Disposition:  Home with hospice  Recommendations for Outpatient Follow-up:  Follow up with home hospice agency Continue on comfort medications  Home Health: None  Equipment/Devices: None  Discharge Condition:Stable  CODE STATUS: DNR  Diet recommendation: Regular  Brief/Interim Summary: 67 y.o. 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  He was initially started on IV vancomycin and then transition over to doxycycline.  He was noticed to have worsening AKI likely in the setting of ischemic ATN/diuretic use as well as supratherapeutic vancomycin levels.  He continued to have issues with hyperkalemia and now has worsening kidney function and refuses hemodialysis.  He was seen by nephrology as well as palliative care and discussions were had regarding comfort measures and patient is agreeable and would like to transition and move forward with home hospice.  His prognosis is quite poor long-term but he is now stable for discharge.  No other acute events noted.  Discharge Diagnoses:  Principal Problem:   Sepsis due to cellulitis [facility]) Active Problems:   Hypokalemia   Type 2 diabetes mellitus ([name])   Hyponatremia   Essential hypertension   Anxiety state   Mixed hyperlipidemia   Smoking   GERD without esophagitis   Peripheral neuropathy   Cellulitis in diabetic foot ([name])   Aortic mural thrombus ([name])   Malnutrition of moderate degree  Principal discharge diagnosis:  Sepsis, POA, secondary to lower extremity cellulitis.  AKI on CKD stage IIIa likely ischemic ATN-worsening with refusal for hemodialysis.  Discharge Instructions   Allergies as of 03/04/2023       Reactions   Lisinopril Other (See Comments)   Other reaction(s): worsening kidney function   Pollen Extract    Other reaction(s): sneezing   Oxycodone Itching, Nausea Only   Pt is able to tolerate if taken with benadryl        Medication List     STOP taking these medications    ALPRAZolam 1 MG tablet Commonly known as: XANAX   atorvastatin 20 MG tablet Commonly known as: LIPITOR   benzonatate 200 MG capsule Commonly known as: TESSALON   clopidogrel 75 MG tablet Commonly known as: PLAVIX   diltiazem 180 MG 24 hr capsule Commonly known as: CARDIZEM CD   FLUoxetine 40 MG capsule Commonly known as: PROZAC   insulin regular human CONCENTRATED 500 UNIT/ML injection Commonly known as: HUMULIN R   isosorbide mononitrate 60 MG 24 hr tablet Commonly known as: IMDUR   linagliptin 5 MG Tabs tablet Commonly known as: TRADJENTA   midodrine 2.5 MG tablet Commonly known as: PROAMATINE   nitroGLYCERIN 0.4 MG SL tablet Commonly known as: NITROSTAT   NovoLOG FlexPen 100 UNIT/ML FlexPen Generic drug: insulin aspart   potassium chloride SA 20 MEQ tablet Commonly known as: KLOR-CON M   ranolazine 500 MG 12 hr tablet Commonly known as: RANEXA   torsemide 20 MG tablet Commonly known as: DEMADEX       TAKE these medications    diphenhydrAMINE 25 mg capsule Commonly known as: BENADRYL Take 50 mg by mouth at bedtime.   esomeprazole 40  MG capsule Commonly known as: NEXIUM Take 40 mg by mouth 2 (two) times daily before a meal.   LORazepam 2 MG/ML concentrated solution Commonly known as: ATIVAN Take 1 mL (2 mg total) by mouth every 4 (four) hours as needed for anxiety.   morphine 20 MG/5ML solution Take 0.6 mLs (2.4 mg total) by mouth every 2 (two) hours as needed for  pain.   ondansetron 4 MG tablet Commonly known as: ZOFRAN Take 4 mg by mouth every 8 (eight) hours as needed for nausea or vomiting.   oxyCODONE-acetaminophen 10-325 MG tablet Commonly known as: PERCOCET Take 1 tablet by mouth every 6 (six) hours as needed.   pregabalin 25 MG capsule Commonly known as: LYRICA Take 25 mg by mouth 2 (two) times daily.   zolpidem 5 MG tablet Commonly known as: AMBIEN Take 5 mg by mouth at bedtime as needed for sleep.        Contact information for after-discharge care     Destination     [facility] .   Service: Skilled Nursing Contact information: [address] [place] [address] [phone]                    Allergies  Allergen Reactions   Lisinopril Other (See Comments)    Other reaction(s): worsening kidney function   Pollen Extract     Other reaction(s): sneezing   Oxycodone Itching and Nausea Only    Pt is able to tolerate if taken with benadryl    Consultations: Nephrology Palliative care   Procedures/Studies: US RENAL  Result Date: 02/24/2023 CLINICAL DATA:  Acute kidney insufficiency EXAM: RENAL / URINARY TRACT ULTRASOUND COMPLETE COMPARISON:  CT 02/15/2023 FINDINGS: Right Kidney: Previous right nephrectomy. Left Kidney: Renal measurements: 13.3 x 8.2 x 7.2 cm = volume: 410.1 mL. No collecting system dilatation or perinephric fluid. Exophytic midportion simple appearing cyst measuring 16 mm which is anechoic with through transmission and smooth margins. Bladder: Appears normal for degree of bladder distention. Other: None. IMPRESSION: Previous right nephrectomy. No left-sided collecting system dilatation. Electronically Signed   By: [name] M.D.   On: 02/24/2023 10:18   MR FOOT RIGHT W WO CONTRAST  Result Date: 02/21/2023 CLINICAL DATA:  Right foot infection. EXAM: MRI OF THE RIGHT FOOT WITHOUT AND WITH CONTRAST TECHNIQUE: Multiplanar,  multisequence MR imaging of the right foot was performed before and after the administration of intravenous contrast. CONTRAST:  10mL GADAVIST GADOBUTROL 1 MMOL/ML IV SOLN COMPARISON:  Right foot x-rays from yesterday. FINDINGS: Bones/Joint/Cartilage No suspicious marrow signal abnormality. No fracture or dislocation. 8 mm osteochondral lesion in the medial talar dome. Mild degenerative changes of the first MTP joint with small joint effusion. Ligaments Medial and lateral ankle ligaments are grossly intact. Lisfranc ligament is intact. Toe collateral ligaments are intact. Muscles and Tendons Intact. No tenosynovitis. Increased T2 signal within and atrophy of the intrinsic muscles of the forefoot, nonspecific, but likely related to diabetic muscle changes. Soft tissue Diffuse soft tissue swelling of the right foot, most of which enhances. There is a large area of skin and superficial soft tissue non-enhancement along the plantar aspect of the forefoot extending into the third toe (series 17, image 28). Noted skin blister along the plantar aspect of the forefoot. No subcutaneous fluid collection. No soft tissue mass. IMPRESSION: 1. Diffuse right foot cellulitis. Large area of skin and superficial soft tissue non-enhancement along the plantar aspect of the forefoot extending into  the third toe, consistent with poor perfusion and possibly devitalized soft tissue. Overlying skin blister noted. Correlate with physical exam. 2. No abscess or osteomyelitis. 3. 8 mm osteochondral lesion in the medial talar dome. Electronically Signed   By: Obie Dredge M.D.   On: 02/21/2023 12:22   DG Foot 2 Views Right  Result Date: 02/20/2023 CLINICAL DATA:  Soft tissue infection. EXAM: RIGHT FOOT - 2 VIEW COMPARISON:  None Available. FINDINGS: There is mild diffuse soft tissue swelling. Small plantar and tiny posterior calcaneal heel spurs. The cortices are intact. Moderate hallux valgus. No cortical erosion is seen.  No acute  fracture or dislocation. IMPRESSION: Mild diffuse soft tissue swelling. No cortical erosion is seen to indicate radiographic evidence of acute osteomyelitis. Electronically Signed   By: Neita Garnet M.D.   On: 02/20/2023 16:20   US ARTERIAL ABI (SCREENING LOWER EXTREMITY)  Result Date: 02/16/2023 CLINICAL DATA:  Cellulitis EXAM: NONINVASIVE PHYSIOLOGIC VASCULAR STUDY OF BILATERAL LOWER EXTREMITIES TECHNIQUE: Evaluation of both lower extremities were performed at rest, including calculation of ankle-brachial indices with single level Doppler, pressure and pulse volume recording. COMPARISON:  None Available. FINDINGS: Right ABI:  0.69 Left ABI:  1.2 Right Lower Extremity: Abnormal monophasic arterial waveforms with post obstructive pattern. Left Lower Extremity:  Normal arterial waveforms at the ankle. 0.5-0.79 Moderate PAD IMPRESSION: 1. Abnormal resting right ankle-brachial index of 0.69 consistent with at least moderate underlying peripheral arterial disease. 2. Normal resting left ankle-brachial index. Signed, Sterling Big, MD, RPVI [facility] Electronically Signed   By: Malachy Moan M.D.   On: 02/16/2023 12:15   CT Angio Chest/Abd/Pel for Dissection W and/or W/WO  Result Date: 02/15/2023 CLINICAL DATA:  Acute aortic syndrome suspected, chest pain EXAM: CT ANGIOGRAPHY CHEST, ABDOMEN AND PELVIS TECHNIQUE: Non-contrast CT of the chest was initially obtained. Multidetector CT imaging through the chest, abdomen and pelvis was performed using the standard protocol during bolus administration of intravenous contrast. Multiplanar reconstructed images and MIPs were obtained and reviewed to evaluate the vascular anatomy. RADIATION DOSE REDUCTION: This exam was performed according to the departmental dose-optimization program which includes automated exposure control, adjustment of the mA and/or kV according to patient size and/or use of  iterative reconstruction technique. CONTRAST:  OMNIPAQUE IOHEXOL 350 MG/ML SOLN COMPARISON:  CT chest angiogram, 02/02/2023 FINDINGS: CTA CHEST FINDINGS VASCULAR Aorta: Satisfactory opacification of the aorta. Normal contour and caliber of the thoracic aorta. No evidence of aneurysm, dissection, or other acute aortic pathology. Severe aortic atherosclerosis. Cardiovascular: No evidence of pulmonary embolism on limited non-tailored examination. Normal heart size. Three-vessel coronary artery calcifications. No pericardial effusion. Review of the MIP images confirms the above findings. NON VASCULAR Mediastinum/Nodes: No enlarged mediastinal, hilar, or axillary lymph nodes. Thyroid gland, trachea, and esophagus demonstrate no significant findings. Lungs/Pleura: Mild centrilobular and paraseptal emphysema. Diffuse bilateral bronchial wall thickening. Unchanged appearance of the chest, with l multiple left-sided wedge resections with associated scarring and volume loss (series 7, image 91). Musculoskeletal: No chest wall abnormality. No acute osseous findings. Review of the MIP images confirms the above findings. CTA ABDOMEN AND PELVIS FINDINGS VASCULAR Normal caliber of the abdominal aorta. Severe mixed aortic atherosclerosis with extensive irregular mural thrombus. No evidence of aneurysm, dissection, or other acute aortic pathology. Standard branching pattern of the abdominal aorta with solitary left renal artery. Right renal artery is surgically absent. Review of the MIP images confirms the above findings. NON-VASCULAR Hepatobiliary: No focal liver abnormality is seen.  Hepatic steatosis. Status post cholecystectomy. No biliary dilatation. Pancreas: Unremarkable. No pancreatic ductal dilatation or surrounding inflammatory changes. Spleen: Normal in size without significant abnormality. Adrenals/Urinary Tract: Adrenal glands are unremarkable. Status post right nephrectomy. The left kidney is normal, without renal  calculi, solid lesion, or hydronephrosis. Bladder is unremarkable. Stomach/Bowel: Stomach is within normal limits. Appendix not clearly visualized. No evidence of bowel wall thickening, distention, or inflammatory changes. Colon is fluid-filled to the rectum. Lymphatic: No enlarged abdominal or pelvic lymph nodes. Reproductive: Prostatomegaly. Other: Fat containing right inguinal hernia.  No ascites. Musculoskeletal: No acute osseous findings. IMPRESSION: 1. No evidence of aortic aneurysm, dissection, or other acute aortic pathology. 2. Severe mixed aortic atherosclerosis with extensive irregular mural thrombus. 3. Colon is fluid-filled to the rectum, suggestive of diarrheal illness. 4. Emphysema and diffuse bilateral bronchial wall thickening. 5. Unchanged appearance of the chest, with multiple left-sided wedge resections with associated scarring and volume loss. 6. Status post right nephrectomy. 7. Hepatic steatosis. 8. Prostatomegaly. 9. Coronary artery disease. Aortic Atherosclerosis (ICD10-I70.0) and Emphysema (ICD10-J43.9). Electronically Signed   By: Jearld Lesch M.D.   On: 02/15/2023 16:20   DG Chest Port 1 View  Result Date: 02/15/2023 CLINICAL DATA:  Questionable sepsis, evaluate for abnormality. Chest pain since last night. EXAM: PORTABLE CHEST 1 VIEW COMPARISON:  Radiographs 02/02/2023 and 01/16/2023.  CTA 02/02/2023. FINDINGS: 1212 hours. The heart size and mediastinal contours are stable with aortic atherosclerosis. There is stable chronic volume loss in the left hemithorax with associated left lung scarring and rounded atelectasis on CT. The right lung appears clear. No evidence of acute superimposed airspace disease, pleural effusion or pneumothorax. Previous left shoulder arthroplasty noted. Telemetry leads overlie the chest. IMPRESSION: Stable chronic findings in the left hemithorax. No evidence of acute superimposed process. Electronically Signed   By: Carey Bullocks M.D.   On: 02/15/2023  13:07   MR BRAIN WO CONTRAST  Result Date: 02/02/2023 CLINICAL DATA:  Mental status change, unknown cause EXAM: MRI HEAD WITHOUT CONTRAST TECHNIQUE: Multiplanar, multiecho pulse sequences of the brain and surrounding structures were obtained without intravenous contrast. COMPARISON:  Same day CT angiogram, CT head 12/27/2022. FINDINGS: Brain: Negative for an acute infarct. No hemorrhage. No hydrocephalus. No extra-axial fluid collection. There is a chronic right MCA territory infarct. There is sequela of mild overall chronic microvascular ischemic change. Vascular: Absent flow void in the petrous segment of the left ICA (series 10, image 5). This is compatible with the occlusion seen on same day CT angiogram. Skull and upper cervical spine: Normal marrow signal. Sinuses/Orbits: Moderate left and small right mastoid effusion. No middle ear effusion. Polypoid mucosal thickening left maxillary sinus. Orbits are unremarkable. Other: None. IMPRESSION: 1. No acute intracranial process. 2. Absent flow void in the petrous segment of the left ICA, compatible with occlusion seen on same day CT head and neck angiogram. 3. Chronic right MCA territory infarct. Electronically Signed   By: Lorenza Cambridge M.D.   On: 02/02/2023 18:29   CT Angio Chest PE W and/or Wo Contrast  Result Date: 02/02/2023 CLINICAL DATA:  Altered mental status, high clinical suspicion for PE EXAM: CT ANGIOGRAPHY CHEST WITH CONTRAST TECHNIQUE: Multidetector CT imaging of the chest was performed using the standard protocol during bolus administration of intravenous contrast. Multiplanar CT image reconstructions and MIPs were obtained to evaluate the vascular anatomy. RADIATION DOSE REDUCTION: This exam was performed according to the departmental dose-optimization program which includes automated exposure control, adjustment of the mA and/or kV according to  patient size and/or use of iterative reconstruction technique. CONTRAST:  50mL OMNIPAQUE IOHEXOL  350 MG/ML SOLN COMPARISON:  Previous CT done on 05/24/2022 and chest radiograph done today FINDINGS: Cardiovascular: There is no demonstrable intimal flap in the thoracic aorta. Atherosclerotic plaques and calcifications are noted in thoracic aorta and its major branches. Significant atherosclerotic plaques are noted in the proximal course of left subclavian artery. There are no intraluminal filling defects in central pulmonary artery branches. Motion artifact and infiltrates limit evaluation of peripheral pulmonary artery branches. Mediastinum/Nodes: No significant lymphadenopathy is seen. Coarse calcifications are seen in right lobe thyroid. Lungs/Pleura: Breathing motion limits evaluation of lung fields. There are patchy infiltrates in left lower lobe. There is decreased volume in the left lung. There is prominence of interstitial markings in right parahilar region and right lower lung field. There is no significant pleural effusion or pneumothorax. Upper Abdomen: There is fatty infiltration in liver. Small hiatal hernia is seen. Musculoskeletal: No acute findings are seen. Postsurgical changes are noted in the left shoulder. Review of the MIP images confirms the above findings. IMPRESSION: There is no evidence of central pulmonary artery embolism. Evaluation of peripheral pulmonary artery branches is limited by breathing motion and infiltrates. There is no evidence of thoracic aortic dissection. Aortic arteriosclerosis. Significant atherosclerotic changes are noted in the proximal course of left subclavian artery. Coronary artery calcifications are seen. Patchy infiltrates are seen in left lower lung field. There is subtle increase in interstitial markings in right lung. Findings suggest atelectasis/pneumonia and possibly interstitial edema or interstitial pneumonia. Evaluation of lung fields is limited by motion artifacts. Fatty liver.  Small hiatal hernia. Electronically Signed   By: Ernie Avena M.D.    On: 02/02/2023 17:17   CT ANGIO HEAD NECK W WO CM  Result Date: 02/02/2023 CLINICAL DATA:  Stroke/TIA, determine embolic source EXAM: CT ANGIOGRAPHY HEAD AND NECK WITH AND WITHOUT CONTRAST TECHNIQUE: Multidetector CT imaging of the head and neck was performed using the standard protocol during bolus administration of intravenous contrast. Multiplanar CT image reconstructions and MIPs were obtained to evaluate the vascular anatomy. Carotid stenosis measurements (when applicable) are obtained utilizing NASCET criteria, using the distal internal carotid diameter as the denominator. RADIATION DOSE REDUCTION: This exam was performed according to the departmental dose-optimization program which includes automated exposure control, adjustment of the mA and/or kV according to patient size and/or use of iterative reconstruction technique. CONTRAST:  75mL OMNIPAQUE IOHEXOL 350 MG/ML SOLN COMPARISON:  None Available. FINDINGS: CT HEAD FINDINGS Brain: Right frontal operculum encephalomalacia. No evidence of acute large vascular territory infarct, acute hemorrhage, mass lesion or midline shift. Vascular: See below. Skull: No acute fracture. Sinuses/Orbits: Clear sinuses.  No acute orbital findings. Other: No mastoid effusions. Review of the MIP images confirms the above findings CTA NECK FINDINGS Aortic arch: Great vessel origins are patent without significant stenosis. Calcific atherosclerosis. Right carotid system: Severe ulcerated atherosclerosis of the right carotid bifurcation and proximal ICA with approximately 60% stenosis, somewhat underestimated due to small caliber of the distal ICA. Left carotid system: Occlusion of the left ICA proximally. Non opacification of the remainder of the assay in the neck. Vertebral arteries: Right dominant. 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 Skeleton: No acute abnormality on limited assessment. Other neck: No acute abnormality  on limited assessment. 1.2 cm right thyroid nodule shows not require further imaging follow-up (ref: J Am Coll Radiol. 2015 Feb;12(2): 143-50). Upper chest: Emphysema.  Aortic atherosclerosis. Review of  the MIP images confirms the above findings CTA HEAD FINDINGS Anterior circulation: Age indeterminate occlusion of the left proximal ICA in the neck with irregular reconstitution at the intracranial cavernous segment and opacified MCAs and ACAs. Right intracranial ICA, MCA and ACA are patent without proximal high-grade stenosis. Posterior circulation: Bilateral intradural vertebral arteries, basilar artery and bilateral posterior cerebral arteries are patent without proximal hemodynamically significant stenosis. Venous sinuses: As permitted by contrast timing, patent. Review of the MIP images confirms the above findings IMPRESSION: 1. Age indeterminate occlusion of the left proximal ICA in the neck with irregular reconstitution at the intracranial cavernous segment and opacified MCAs and ACAs. 2. 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 Severe ulcerated atherosclerosis of the right carotid bifurcation and proximal ICA with approximately 60% stenosis, somewhat underestimated due to small caliber of the distal ICA. 4. Aortic Atherosclerosis (ICD10-I70.0) and Emphysema (ICD10-J43.9). Findings discussed with Dr. Estell Harpin via telephone at 3:40 p.m. Electronically Signed   By: Feliberto Harts M.D.   On: 02/02/2023 15:41   DG Chest Port 1 View  Result Date: 02/02/2023 CLINICAL DATA:  Shortness of breath and chest pain.  Unresponsive. EXAM: PORTABLE CHEST 1 VIEW COMPARISON:  01/16/2023 FINDINGS: Defibrillator overlies the chest. Cardiomegaly and aortic atherosclerosis as seen previously. Interstitial and early alveolar pulmonary edema. Chronic pleural and parenchymal scarring on the left. IMPRESSION: Interstitial and early alveolar pulmonary edema. Cardiomegaly.  Aortic atherosclerosis. Defibrillator. Electronically Signed   By: Paulina Fusi M.D.   On: 02/02/2023 15:39     Discharge Exam: Vitals:   03/04/23 0540 03/04/23 0822  BP: 112/68   Pulse: 81   Resp: 18   Temp: 97.6 F (36.4 C)   SpO2: 96% 94%   Vitals:   03/03/23 1943 03/03/23 2052 03/04/23 0540 03/04/23 0822  BP:  120/77 112/68   Pulse:  85 81   Resp:  16 18   Temp:  97.7 F (36.5 C) 97.6 F (36.4 C)   TempSrc:  Oral Oral   SpO2: 95% 97% 96% 94%  Weight:      Height:        General: Pt is alert, awake, not in acute distress Cardiovascular: RRR, S1/S2 +, no rubs, no gallops Respiratory: CTA bilaterally, no wheezing, no rhonchi Abdominal: Soft, NT, ND, bowel sounds + Extremities: no edema, no cyanosis    The results of significant diagnostics from this hospitalization (including imaging, microbiology, ancillary and laboratory) are listed below for reference.     Microbiology: No results found for this or any previous visit (from the past 240 hour(s)).   Labs: BNP (last 3 results) Recent Labs    02/02/23 1400 02/15/23 1200  BNP 174.0* 367.0*   Basic Metabolic Panel: Recent Labs  Lab 02/26/23 0432 02/27/23 0526 02/27/23 1531 02/28/23 0544 03/01/23 0335 03/02/23 0423  NA 129* 131*  --  129* 130* 130*  K 5.0 5.5* 5.8* 5.4* 4.5 4.2  CL 99 99  --  99 97* 98  CO2 19* 19*  --  20* 19* 21*  GLUCOSE 98 91  --  103* 95 86  BUN 54* 67*  --  81* 84* 88*  CREATININE 3.87* 4.50*  --  4.96* 4.92* 4.97*  CALCIUM 7.8* 8.1*  --  8.0* 7.8* 7.6*  MG 1.6* 1.8  --  1.8 1.8 1.9  PHOS  --   --   --   --   --  8.2*   Liver Function Tests: Recent  Labs  Lab 03/02/23 0423  ALBUMIN 2.3*   No results for input(s): "LIPASE", "AMYLASE" in the last 168 hours. No results for input(s): "AMMONIA" in the last 168 hours. CBC: Recent Labs  Lab 02/26/23 0432 02/27/23 0526 02/28/23 0544 03/01/23 0335  WBC 11.9* 10.6* 9.3 8.4  HGB 9.1* 9.7* 9.7* 9.2*  HCT 28.6* 31.3* 30.6*  28.7*  MCV 83.1 83.2 82.5 81.5  PLT 205 216 229 237   Cardiac Enzymes: No results for input(s): "CKTOTAL", "CKMB", "CKMBINDEX", "TROPONINI" in the last 168 hours. BNP: Invalid input(s): "POCBNP" CBG: Recent Labs  Lab 03/01/23 2011 03/02/23 0726 03/02/23 1108 03/02/23 1615 03/03/23 1212  GLUCAP 126* 104* 93 95 115*   D-Dimer No results for input(s): "DDIMER" in the last 72 hours. Hgb A1c No results for input(s): "HGBA1C" in the last 72 hours. Lipid Profile No results for input(s): "CHOL", "HDL", "LDLCALC", "TRIG", "CHOLHDL", "LDLDIRECT" in the last 72 hours. Thyroid function studies No results for input(s): "TSH", "T4TOTAL", "T3FREE", "THYROIDAB" in the last 72 hours.  Invalid input(s): "FREET3" Anemia work up No results for input(s): "VITAMINB12", "FOLATE", "FERRITIN", "TIBC", "IRON", "RETICCTPCT" in the last 72 hours. Urinalysis    Component Value Date/Time   COLORURINE YELLOW 02/16/2023 0600   APPEARANCEUR CLEAR 02/16/2023 0600   LABSPEC 1.023 02/16/2023 0600   PHURINE 6.0 02/16/2023 0600   GLUCOSEU NEGATIVE 02/16/2023 0600   HGBUR SMALL (A) 02/16/2023 0600   BILIRUBINUR NEGATIVE 02/16/2023 0600   KETONESUR NEGATIVE 02/16/2023 0600   PROTEINUR 100 (A) 02/16/2023 0600   UROBILINOGEN 0.2 02/28/2015 1310   NITRITE NEGATIVE 02/16/2023 0600   LEUKOCYTESUR NEGATIVE 02/16/2023 0600   Sepsis Labs Recent Labs  Lab 02/26/23 0432 02/27/23 0526 02/28/23 0544 03/01/23 0335  WBC 11.9* 10.6* 9.3 8.4   Microbiology No results found for this or any previous visit (from the past 240 hour(s)).   Time coordinating discharge: 35 minutes  SIGNED:   Erick Blinks, DO [facility] 03/04/2023, 8:49 AM  If 7PM-7AM, please contact night-coverage [email or web address]

## 2023-03-04 NOTE — Progress Notes (Signed)
Palliative: Mr. Capozza is sitting up quietly in bed.  He appears acutely/chronically ill and frail.  He is resting comfortably and does not answer when I call his name.  He has been transitioned to full comfort care.  He is to be discharged home with hospice care under the care of friends and neighbors today.  No needs identified at this time.  Conference with attending, bedside nursing staff, transition of care team of care, related to patient condition, needs, goals of care, disposition.    Plan: Home with the benefits of hospice care.  Do not rehospitalize       25 minutes  Lillia Carmel, NP Palliative medicine team Team phone [phone] Greater than 50% of this time was spent counseling and coordinating care related to the above assessment and plan.

## 2023-03-11 NOTE — Progress Notes (Signed)
   03/04/23 1405  Spiritual Encounters  Type of Visit Initial  Care provided to: Patient  Referral source IDT Rounds  Reason for visit End-of-life  OnCall Visit No  Spiritual Framework  Presenting Themes Courage hope and growth;Meaning/purpose/sources of inspiration  Community/Connection Family  Patient Stress Factors None identified  Family Stress Factors None identified  Interventions  Spiritual Care Interventions Made Compassionate presence;Prayer  Intervention Outcomes  Outcomes Connection to spiritual care;Awareness of support   Found [name] in hospital bed. He looks frail and has a hard time acknowledging me. Chaplain sat bedside and provided compassionate presence and quiet prayer today. He will be leaving th hospital to be taken home with hospice. No further needs noted at this time.   Rev. Jolyn Lent, M.Div Chaplain

## 2023-03-21 DEATH — deceased

## 2023-04-16 ENCOUNTER — Ambulatory Visit: Payer: 59 | Admitting: Cardiology

## 2024-04-27 NOTE — Progress Notes (Signed)
 This encounter was created in error - please disregard.
